# Patient Record
Sex: Male | Born: 1975
Health system: Southern US, Community
[De-identification: ages and names within clinical notes are randomized; demographics above are authoritative.]

## PROBLEM LIST (undated history)

## (undated) DIAGNOSIS — T8859XA Other complications of anesthesia, initial encounter: Secondary | ICD-10-CM

## (undated) DIAGNOSIS — G459 Transient cerebral ischemic attack, unspecified: Secondary | ICD-10-CM

## (undated) DIAGNOSIS — I10 Essential (primary) hypertension: Secondary | ICD-10-CM

## (undated) DIAGNOSIS — T4145XA Adverse effect of unspecified anesthetic, initial encounter: Secondary | ICD-10-CM

## (undated) DIAGNOSIS — T884XXA Failed or difficult intubation, initial encounter: Secondary | ICD-10-CM

## (undated) DIAGNOSIS — T50901A Poisoning by unspecified drugs, medicaments and biological substances, accidental (unintentional), initial encounter: Secondary | ICD-10-CM

## (undated) DIAGNOSIS — F319 Bipolar disorder, unspecified: Secondary | ICD-10-CM

## (undated) DIAGNOSIS — E119 Type 2 diabetes mellitus without complications: Secondary | ICD-10-CM

## (undated) DIAGNOSIS — F419 Anxiety disorder, unspecified: Secondary | ICD-10-CM

## (undated) DIAGNOSIS — G4733 Obstructive sleep apnea (adult) (pediatric): Secondary | ICD-10-CM

## (undated) DIAGNOSIS — T782XXA Anaphylactic shock, unspecified, initial encounter: Secondary | ICD-10-CM

## (undated) DIAGNOSIS — E785 Hyperlipidemia, unspecified: Secondary | ICD-10-CM

## (undated) DIAGNOSIS — I38 Endocarditis, valve unspecified: Secondary | ICD-10-CM

## (undated) DIAGNOSIS — F909 Attention-deficit hyperactivity disorder, unspecified type: Secondary | ICD-10-CM

## (undated) DIAGNOSIS — F1910 Other psychoactive substance abuse, uncomplicated: Secondary | ICD-10-CM

## (undated) HISTORY — DX: Transient cerebral ischemic attack, unspecified: G45.9

## (undated) HISTORY — PX: TOOTH EXTRACTION: SUR596

## (undated) HISTORY — DX: Obstructive sleep apnea (adult) (pediatric): G47.33

## (undated) HISTORY — PX: CARPAL TUNNEL RELEASE: SHX101

## (undated) HISTORY — PX: WISDOM TOOTH EXTRACTION: SHX21

## (undated) HISTORY — DX: Anxiety disorder, unspecified: F41.9

## (undated) HISTORY — PX: NOSE SURGERY: SHX723

---

## 2003-05-06 ENCOUNTER — Emergency Department (HOSPITAL_COMMUNITY): Admission: AD | Admit: 2003-05-06 | Discharge: 2003-05-06 | Payer: Self-pay | Admitting: Emergency Medicine

## 2003-09-23 ENCOUNTER — Emergency Department (HOSPITAL_COMMUNITY): Admission: EM | Admit: 2003-09-23 | Discharge: 2003-09-23 | Payer: Self-pay

## 2003-10-04 ENCOUNTER — Emergency Department (HOSPITAL_COMMUNITY): Admission: EM | Admit: 2003-10-04 | Discharge: 2003-10-04 | Payer: Self-pay | Admitting: Pediatrics

## 2003-11-28 ENCOUNTER — Inpatient Hospital Stay: Payer: Self-pay | Admitting: Psychiatry

## 2003-12-13 ENCOUNTER — Inpatient Hospital Stay (HOSPITAL_COMMUNITY): Admission: EM | Admit: 2003-12-13 | Discharge: 2003-12-18 | Payer: Self-pay | Admitting: Psychiatry

## 2003-12-13 ENCOUNTER — Ambulatory Visit: Payer: Self-pay | Admitting: Psychiatry

## 2003-12-15 ENCOUNTER — Encounter: Payer: Self-pay | Admitting: Emergency Medicine

## 2004-10-23 ENCOUNTER — Emergency Department (HOSPITAL_COMMUNITY): Admission: EM | Admit: 2004-10-23 | Discharge: 2004-10-24 | Payer: Self-pay | Admitting: Emergency Medicine

## 2004-11-15 ENCOUNTER — Emergency Department (HOSPITAL_COMMUNITY): Admission: EM | Admit: 2004-11-15 | Discharge: 2004-11-15 | Payer: Self-pay | Admitting: Family Medicine

## 2004-11-18 ENCOUNTER — Emergency Department (HOSPITAL_COMMUNITY): Admission: EM | Admit: 2004-11-18 | Discharge: 2004-11-18 | Payer: Self-pay | Admitting: Family Medicine

## 2005-01-19 ENCOUNTER — Ambulatory Visit: Payer: Self-pay | Admitting: Family Medicine

## 2005-06-22 IMAGING — CR DG CHEST 2V
2 series · 2 of 2 positions shown · non-contrast
Comparison: none

CLINICAL DATA: Chest pain and shortness of breath.
 CHEST - 2 VIEWS   
 Two views of the chest show no pneumonia.  Mild peribronchial thickening is seen.  The heart is within normal limits in size.
 IMPRESSION
 No active lung disease.   There is some peribronchial thickening present which may indicate bronchitis.

[view not recorded (1 of 2)]
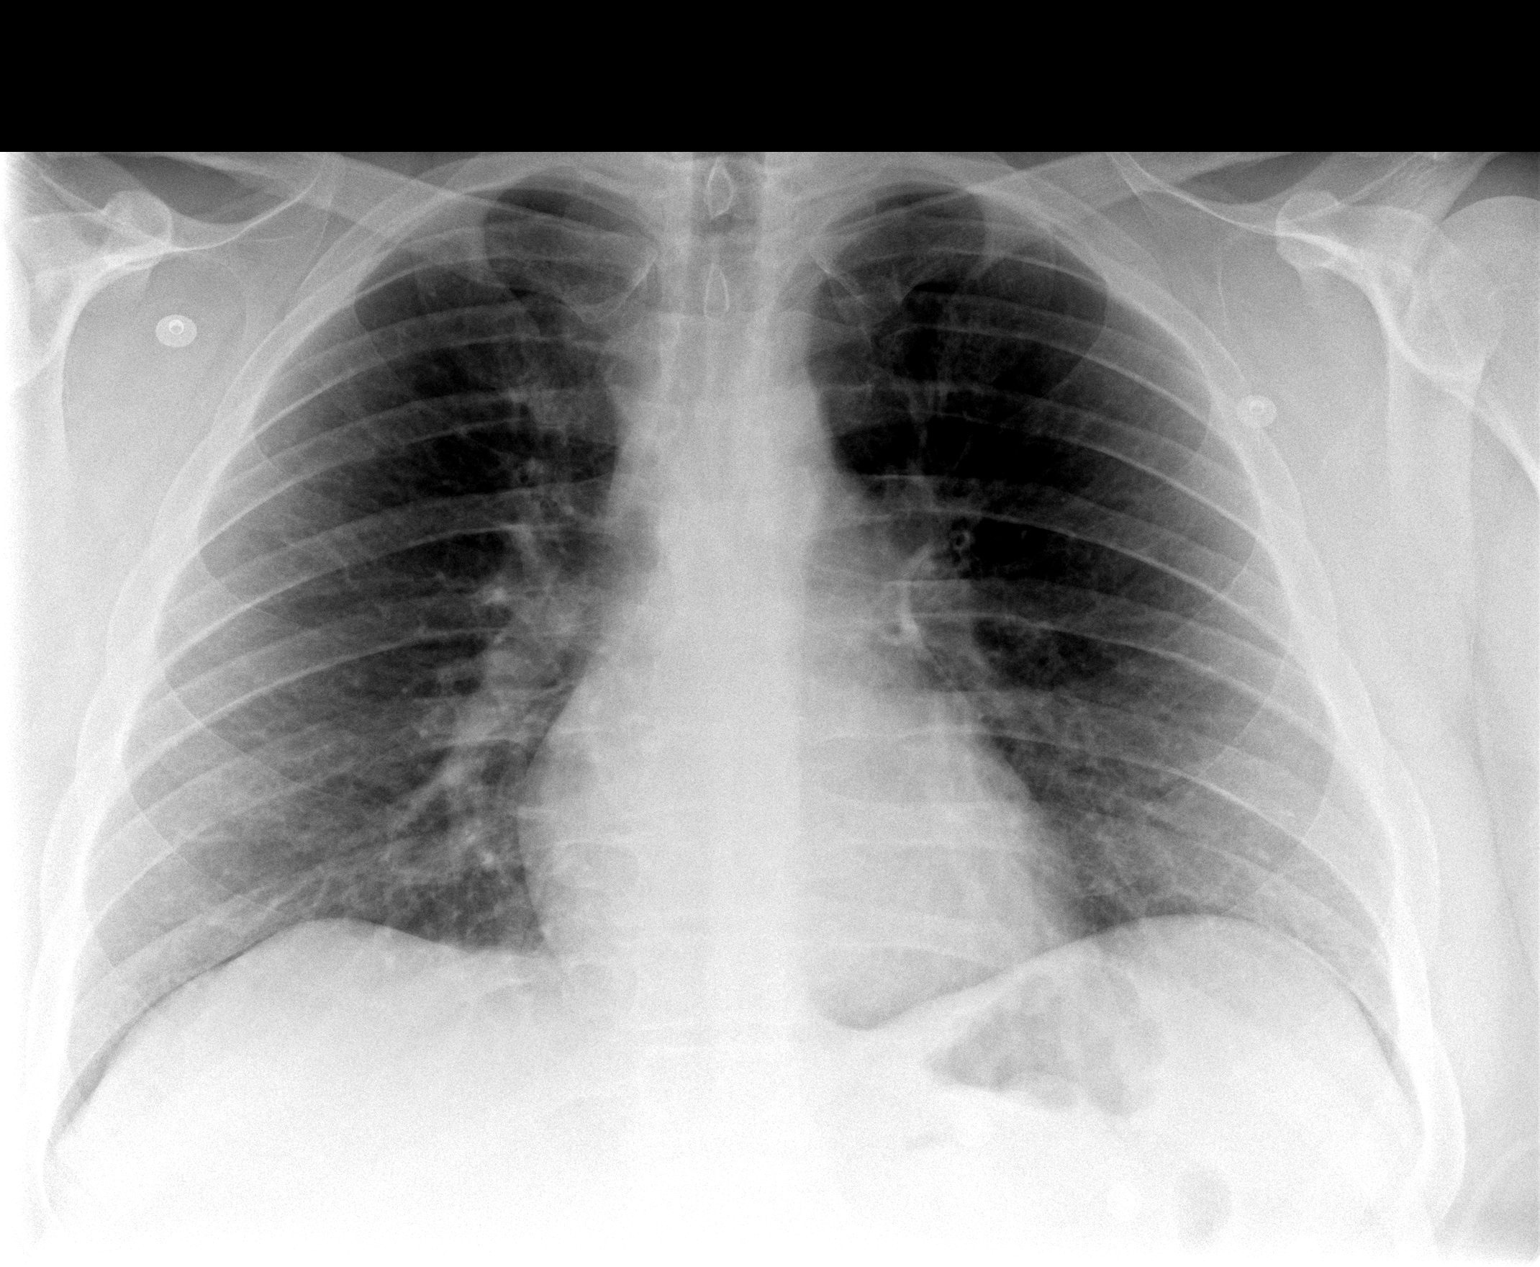

[view not recorded (2 of 2)]
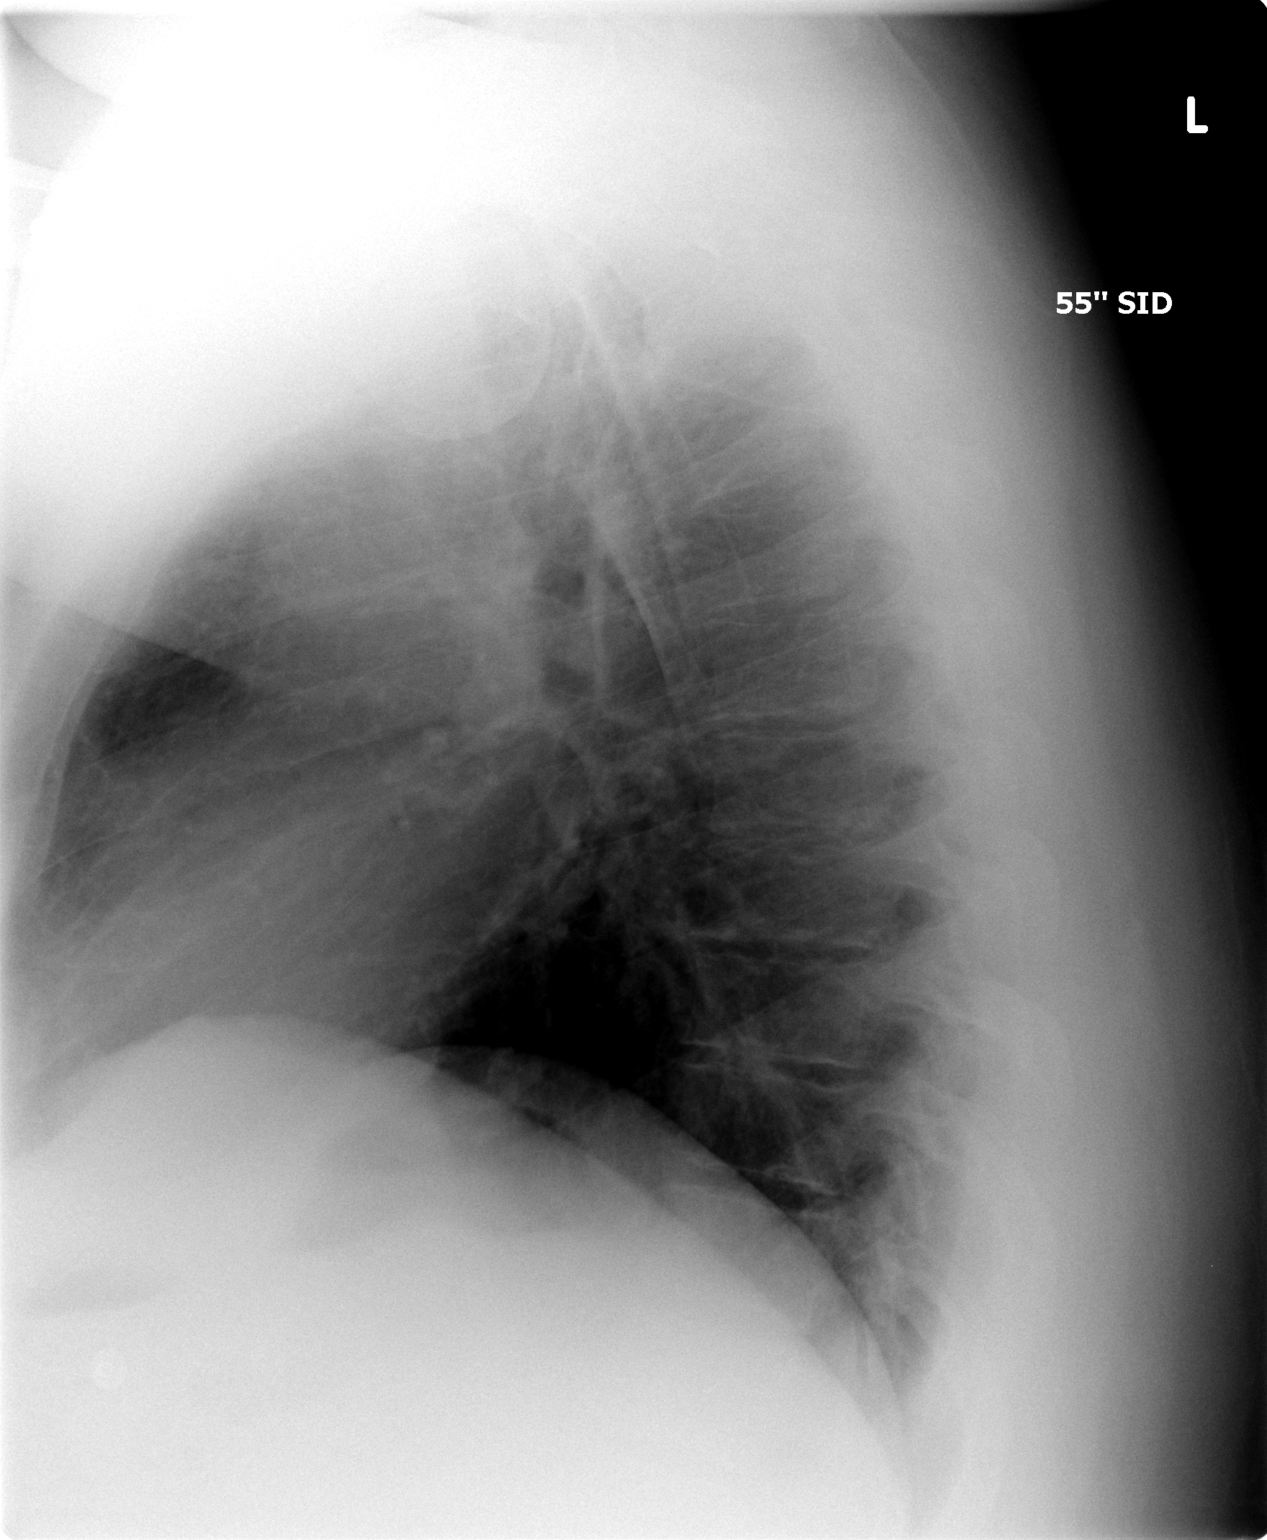

[2 of 2 positions shown; findings below may reference images not displayed]

## 2005-07-02 ENCOUNTER — Emergency Department (HOSPITAL_COMMUNITY): Admission: EM | Admit: 2005-07-02 | Discharge: 2005-07-02 | Payer: Self-pay | Admitting: Emergency Medicine

## 2005-07-03 IMAGING — CR DG CHEST 1V PORT
1 series · 1 of 1 positions shown · non-contrast
Comparison: none

CLINICAL DATA: Chest pain/shortness of breath. 
 PORTABLE CHEST 10/04/03 
 AP upright portable compared to 09/23/03.  
 Heart size within normal limits.  Suboptimal level of inspiration.  Cannot rule out early congestive heart failure although I suspect this is an artifact of under inspiration.  Recommend PA and lateral chest when possible. 
 IMPRESSION
 Suboptimal inspiration ? cannot rule out early congestive heart failure (suspect artifact of under inspiration).

[view not recorded]
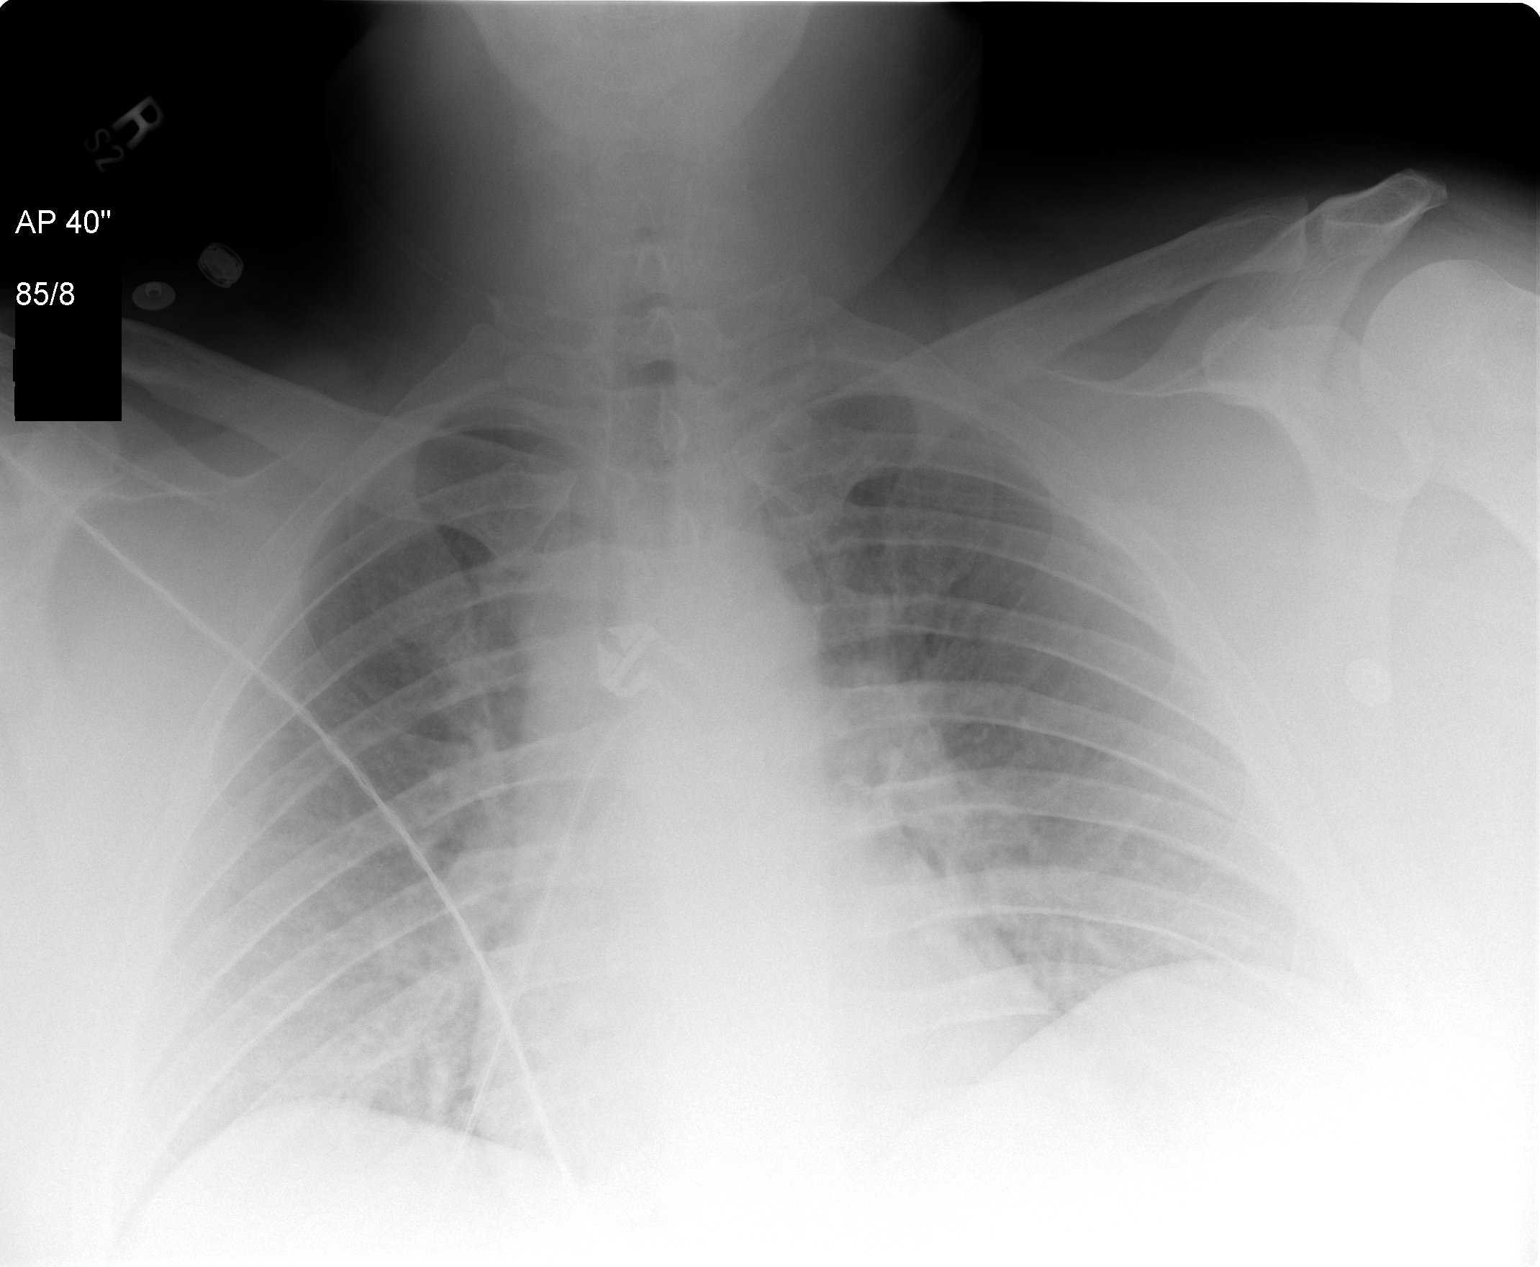

[1 of 1 positions shown; findings below may reference images not displayed]

## 2005-09-13 IMAGING — CR DG CHEST 1V PORT
1 series · 1 of 1 positions shown · non-contrast
Comparison: 10/04/03.

CLINICAL DATA: Chest pain. 
 PORTABLE CHEST

[view not recorded]
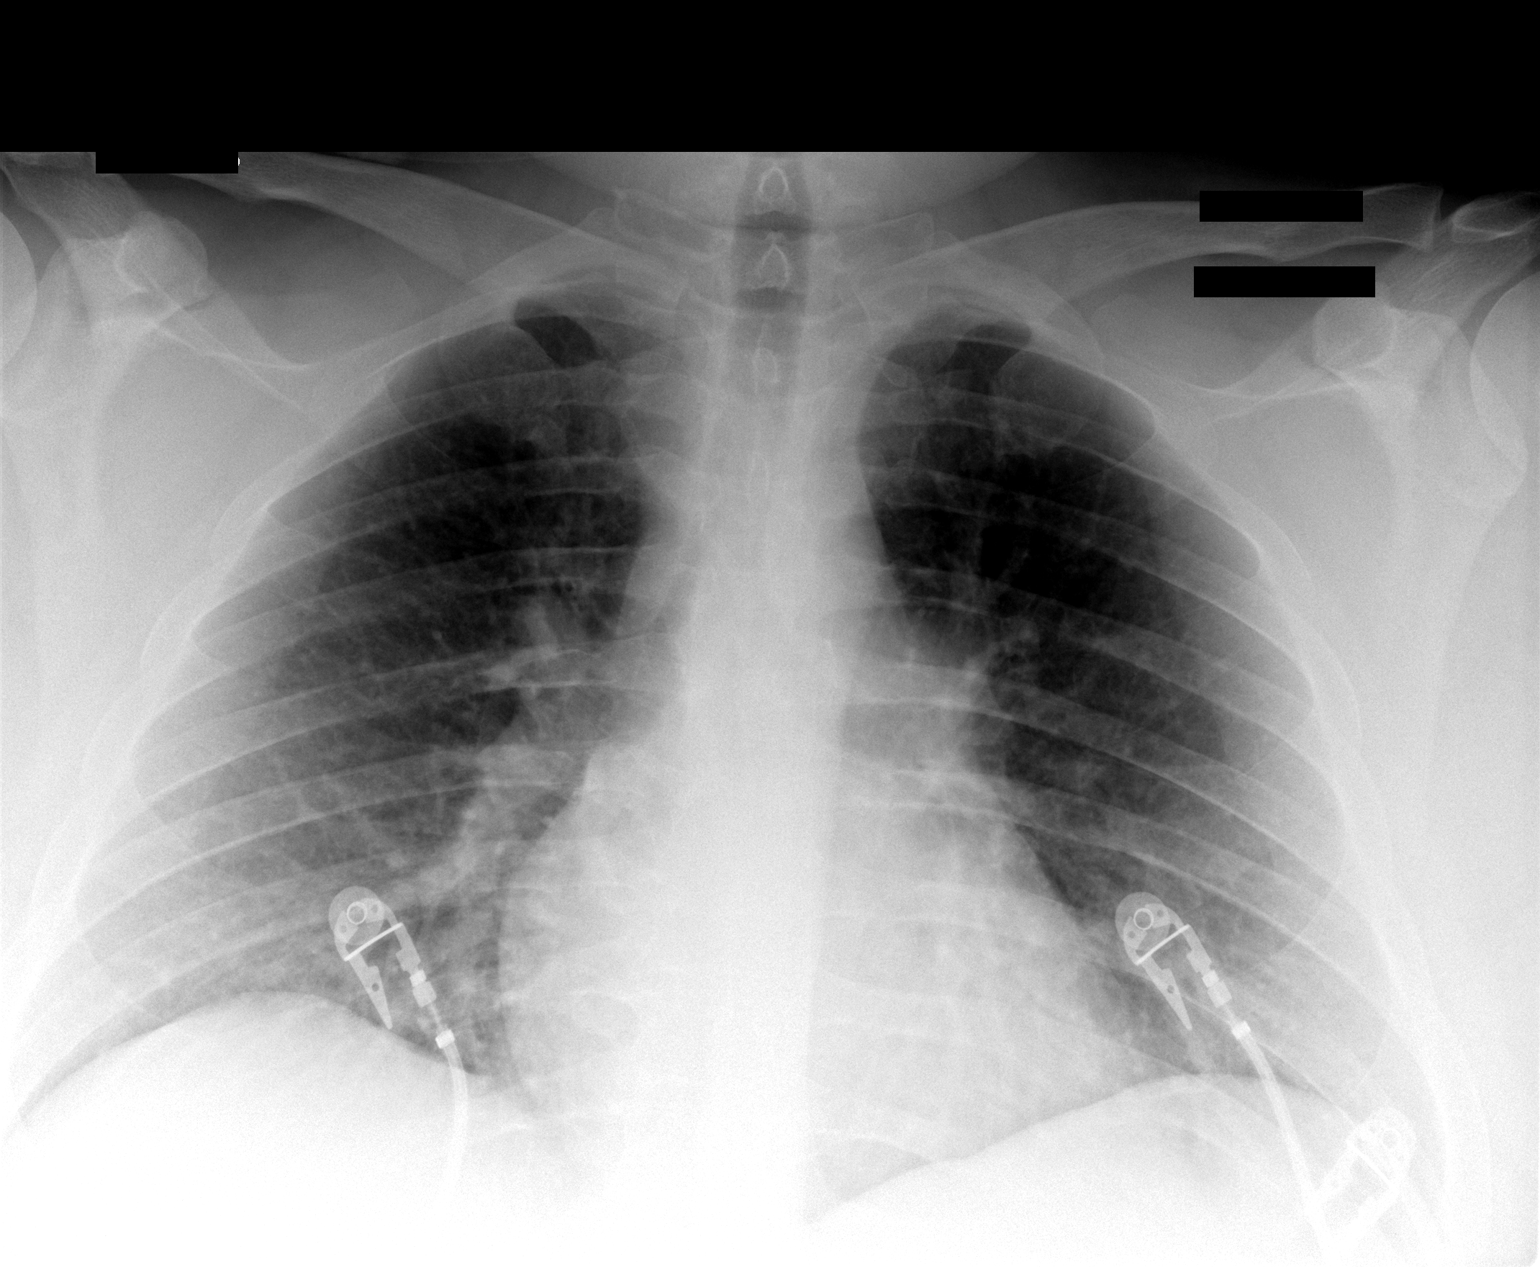

[1 of 1 positions shown; findings below may reference images not displayed]

Heart size is within normal limits for portable technique.  No congestive heart failure or active disease.
IMPRESSION: Within normal limits for portable chest.
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
## 2007-03-02 ENCOUNTER — Encounter: Payer: Self-pay | Admitting: Family Medicine
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
## 2008-01-17 ENCOUNTER — Emergency Department (HOSPITAL_COMMUNITY): Admission: EM | Admit: 2008-01-17 | Discharge: 2008-01-17 | Payer: Self-pay | Admitting: Emergency Medicine

## 2008-07-15 ENCOUNTER — Emergency Department (HOSPITAL_BASED_OUTPATIENT_CLINIC_OR_DEPARTMENT_OTHER): Admission: EM | Admit: 2008-07-15 | Discharge: 2008-07-15 | Payer: Self-pay | Admitting: Emergency Medicine

## 2008-07-15 ENCOUNTER — Ambulatory Visit: Payer: Self-pay | Admitting: Radiology

## 2008-07-26 ENCOUNTER — Emergency Department (HOSPITAL_COMMUNITY): Admission: EM | Admit: 2008-07-26 | Discharge: 2008-07-26 | Payer: Self-pay | Admitting: Emergency Medicine

## 2008-07-27 ENCOUNTER — Encounter: Admission: RE | Admit: 2008-07-27 | Discharge: 2008-07-27 | Payer: Self-pay | Admitting: Emergency Medicine

## 2008-09-18 ENCOUNTER — Observation Stay (HOSPITAL_COMMUNITY): Admission: EM | Admit: 2008-09-18 | Discharge: 2008-09-18 | Payer: Self-pay | Admitting: Internal Medicine

## 2008-09-18 ENCOUNTER — Ambulatory Visit: Payer: Self-pay | Admitting: Diagnostic Radiology

## 2008-09-18 ENCOUNTER — Encounter: Payer: Self-pay | Admitting: Emergency Medicine

## 2008-09-18 ENCOUNTER — Emergency Department (HOSPITAL_COMMUNITY): Admission: EM | Admit: 2008-09-18 | Discharge: 2008-09-19 | Payer: Self-pay | Admitting: Emergency Medicine

## 2008-11-20 ENCOUNTER — Encounter
Admission: RE | Admit: 2008-11-20 | Discharge: 2008-11-20 | Payer: Self-pay | Admitting: Physical Medicine & Rehabilitation

## 2008-12-18 ENCOUNTER — Encounter: Admission: RE | Admit: 2008-12-18 | Discharge: 2008-12-18 | Payer: Self-pay | Admitting: Sports Medicine

## 2009-01-15 ENCOUNTER — Encounter: Payer: Self-pay | Admitting: Specialist

## 2009-01-17 ENCOUNTER — Ambulatory Visit (HOSPITAL_COMMUNITY): Admission: RE | Admit: 2009-01-17 | Discharge: 2009-01-17 | Payer: Self-pay | Admitting: Specialist

## 2009-01-28 ENCOUNTER — Ambulatory Visit (HOSPITAL_COMMUNITY): Admission: RE | Admit: 2009-01-28 | Discharge: 2009-01-28 | Payer: Self-pay | Admitting: Specialist

## 2009-03-05 ENCOUNTER — Encounter: Admission: RE | Admit: 2009-03-05 | Discharge: 2009-03-05 | Payer: Self-pay | Admitting: *Deleted

## 2009-03-05 ENCOUNTER — Encounter: Admission: RE | Admit: 2009-03-05 | Discharge: 2009-03-05 | Payer: Self-pay | Admitting: Orthopedic Surgery

## 2009-07-21 ENCOUNTER — Ambulatory Visit: Payer: Self-pay | Admitting: Radiology

## 2009-07-21 ENCOUNTER — Emergency Department (HOSPITAL_BASED_OUTPATIENT_CLINIC_OR_DEPARTMENT_OTHER): Admission: EM | Admit: 2009-07-21 | Discharge: 2009-07-21 | Payer: Self-pay | Admitting: Emergency Medicine

## 2009-08-14 ENCOUNTER — Inpatient Hospital Stay (HOSPITAL_COMMUNITY): Admission: RE | Admit: 2009-08-14 | Discharge: 2009-08-15 | Payer: Self-pay | Admitting: Otolaryngology

## 2009-08-19 ENCOUNTER — Ambulatory Visit: Payer: Self-pay | Admitting: Diagnostic Radiology

## 2009-08-19 ENCOUNTER — Observation Stay (HOSPITAL_COMMUNITY): Admission: EM | Admit: 2009-08-19 | Discharge: 2009-08-21 | Payer: Self-pay

## 2009-08-19 ENCOUNTER — Other Ambulatory Visit: Payer: Self-pay | Admitting: Emergency Medicine

## 2009-08-19 ENCOUNTER — Encounter: Payer: Self-pay | Admitting: Emergency Medicine

## 2009-08-21 ENCOUNTER — Encounter: Payer: Self-pay | Admitting: Cardiology

## 2009-09-10 ENCOUNTER — Encounter: Admission: RE | Admit: 2009-09-10 | Discharge: 2009-09-10 | Payer: Self-pay | Admitting: Internal Medicine

## 2009-10-11 ENCOUNTER — Ambulatory Visit: Payer: Self-pay | Admitting: Diagnostic Radiology

## 2009-10-11 ENCOUNTER — Emergency Department (HOSPITAL_BASED_OUTPATIENT_CLINIC_OR_DEPARTMENT_OTHER): Admission: EM | Admit: 2009-10-11 | Discharge: 2009-10-12 | Payer: Self-pay | Admitting: Emergency Medicine

## 2009-10-13 ENCOUNTER — Ambulatory Visit: Payer: Self-pay | Admitting: Diagnostic Radiology

## 2009-10-13 ENCOUNTER — Ambulatory Visit (HOSPITAL_BASED_OUTPATIENT_CLINIC_OR_DEPARTMENT_OTHER): Admission: RE | Admit: 2009-10-13 | Discharge: 2009-10-13 | Payer: Self-pay | Admitting: Emergency Medicine

## 2010-02-26 ENCOUNTER — Emergency Department (HOSPITAL_BASED_OUTPATIENT_CLINIC_OR_DEPARTMENT_OTHER)
Admission: EM | Admit: 2010-02-26 | Discharge: 2010-02-26 | Payer: Self-pay | Source: Home / Self Care | Admitting: Emergency Medicine

## 2010-03-31 NOTE — Letter (Signed)
Summary: rpc chart  rpc chart   Imported By: Curtis Sites 12/11/2009 15:54:06  _____________________________________________________________________  External Attachment:    Type:   Image     Comment:   External Document

## 2010-04-14 IMAGING — CR DG LUMBAR SPINE COMPLETE 4+V
5 series · 5 of 5 positions shown · non-contrast
Comparison: None

CLINICAL DATA: Injury.  Low back pain.

LUMBAR SPINE - COMPLETE 4+ VIEW

[t l-spine a.p. *]
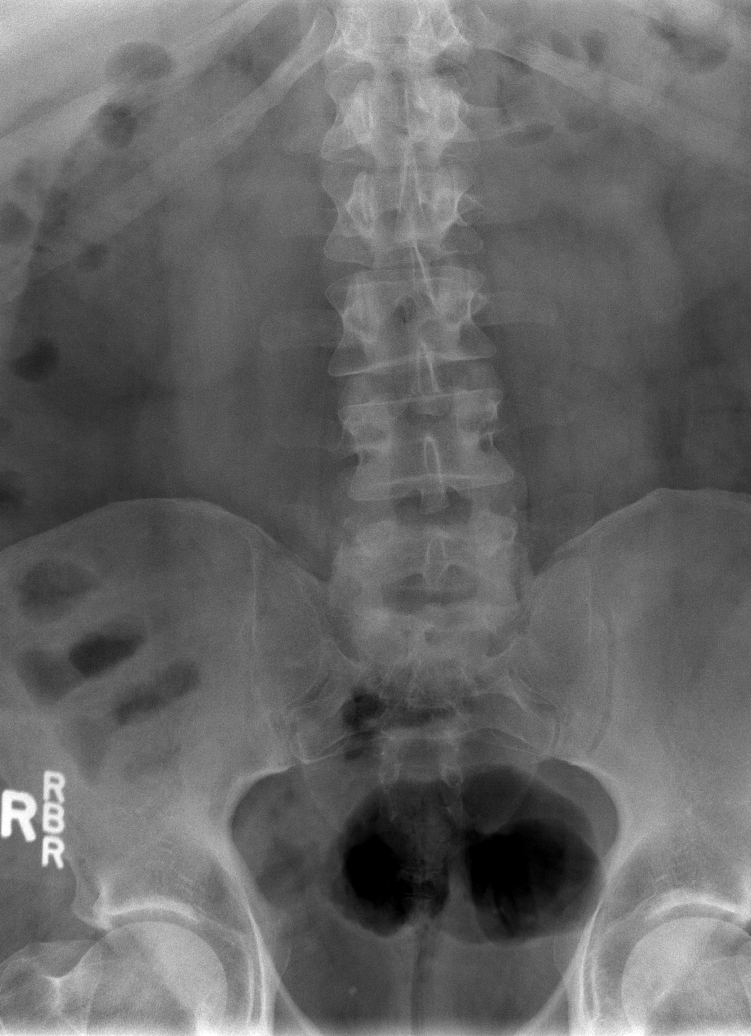

[t l-spine oblique exposure * (1 of 2)]
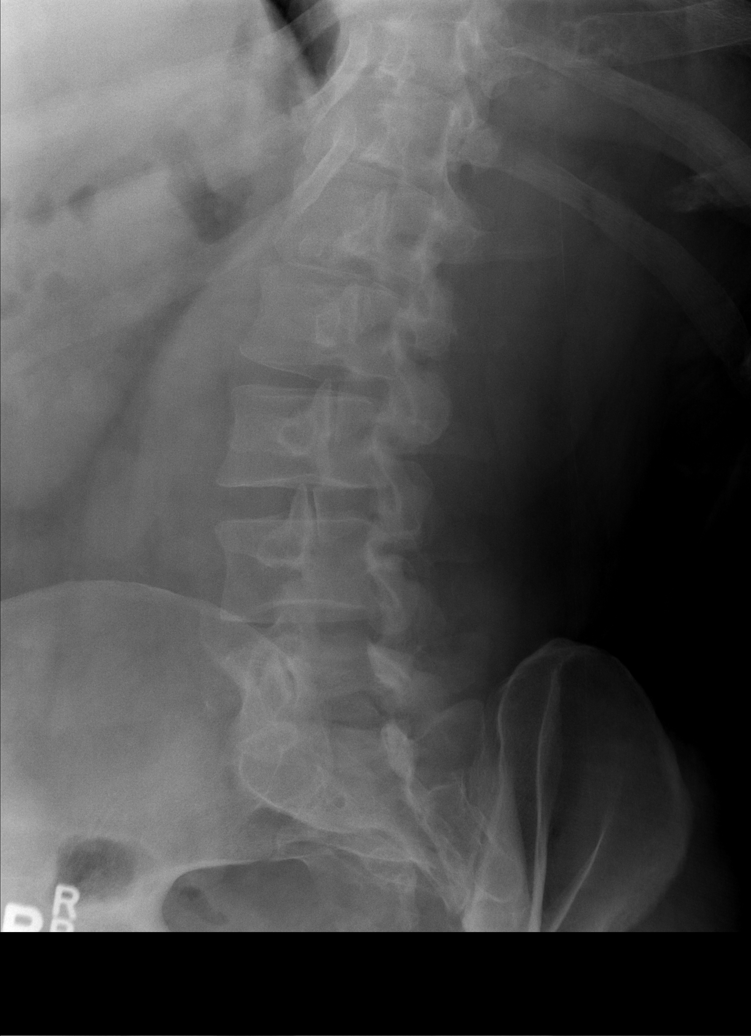

[t l-spine oblique exposure * (2 of 2)]
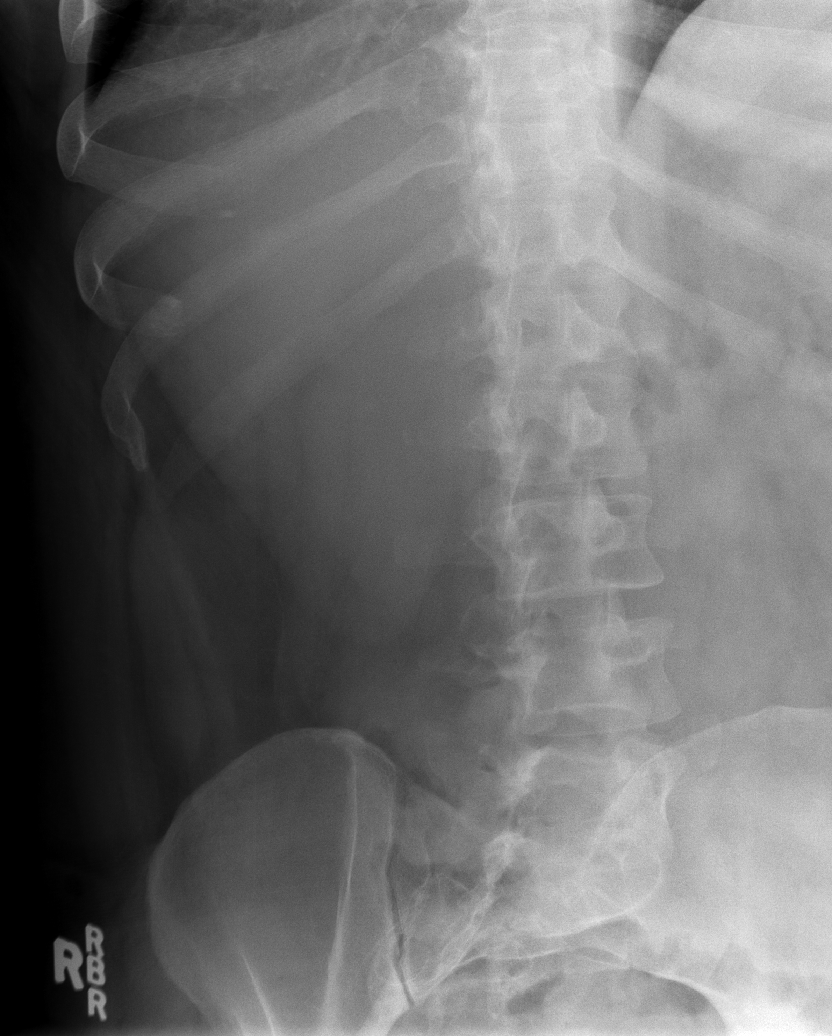

[t l-spine lat (1 of 2)]
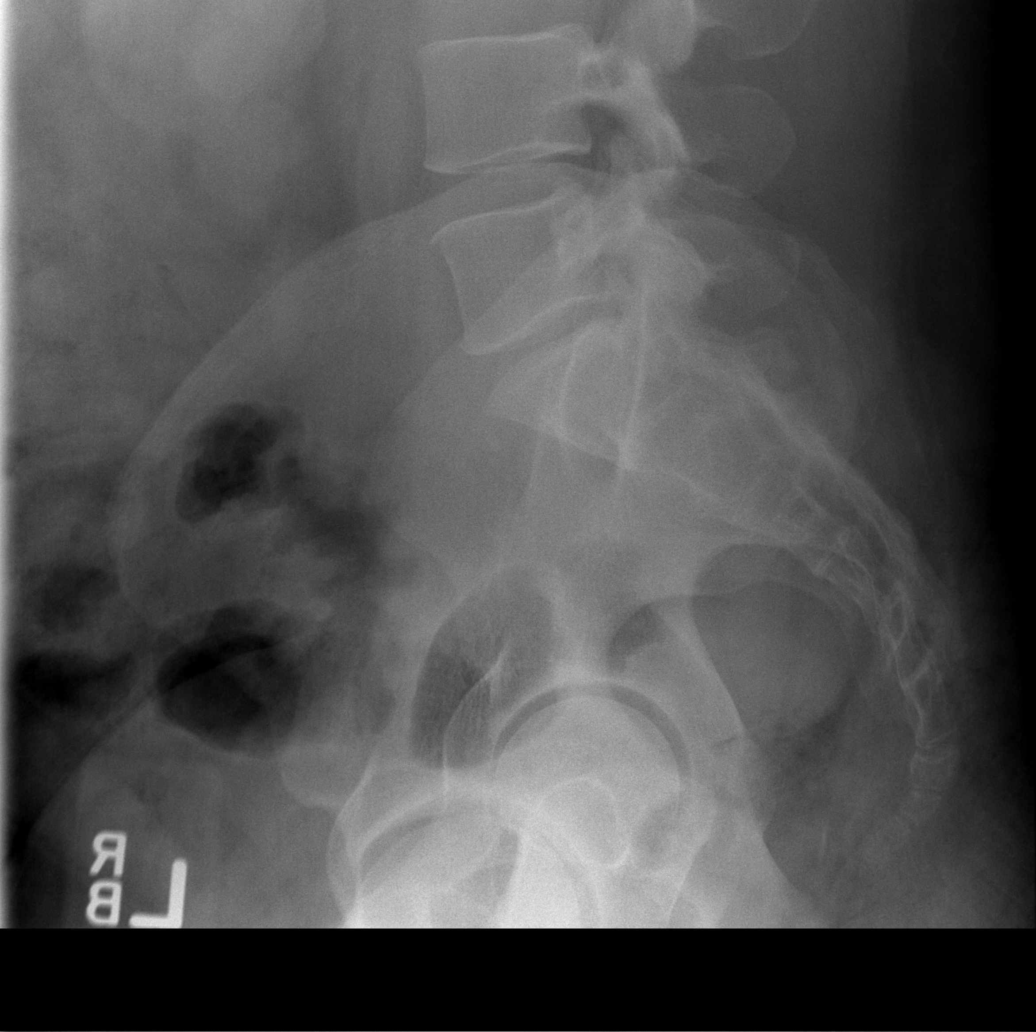

[t l-spine lat (2 of 2)]
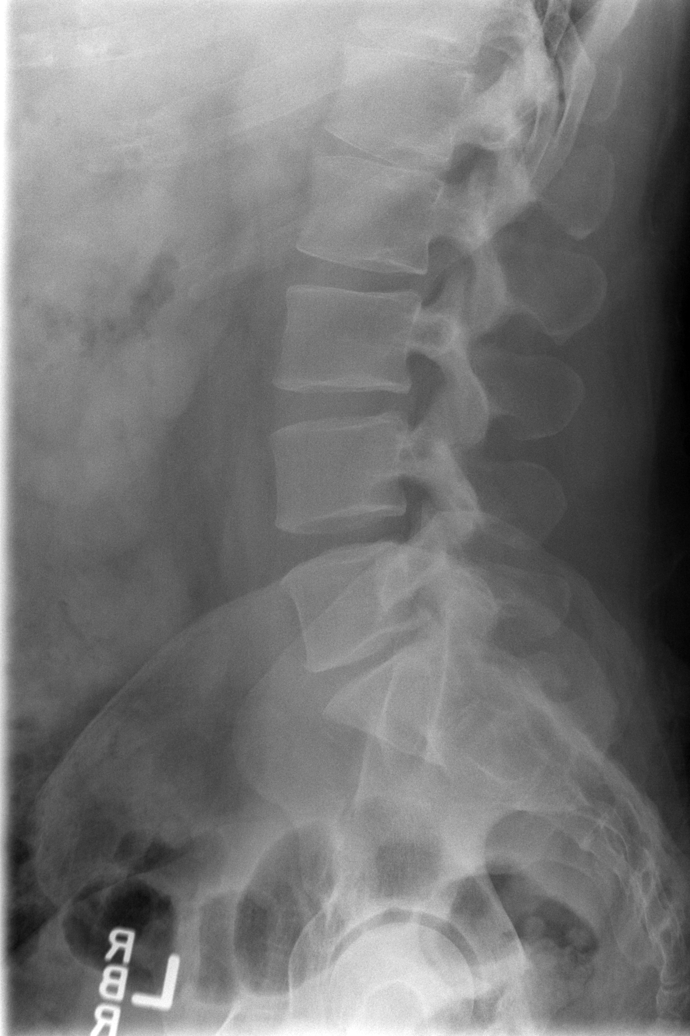

[5 of 5 positions shown; findings below may reference images not displayed]

FINDINGS: There is no evidence of lumbar spine fracture.  Alignment
is normal.  Intervertebral disc spaces are maintained.
IMPRESSION: Negative.

## 2010-04-26 IMAGING — MR MR LUMBAR SPINE W/O CM
4 of 6 series · 16 of 48 positions shown · non-contrast
Comparison: Lumbar spine radiographs 07/15/2008.

CLINICAL DATA: Severe low back and left leg pain with left leg
numbness since falling 1 week ago.

MRI LUMBAR SPINE WITHOUT CONTRAST
TECHNIQUE: Multiplanar and multiecho pulse sequences of the lumbar
spine were obtained without intravenous contrast.

[Series 3: T2 · sagittal · 4.0mm · 0.44mm/px · 4 of 11 slices shown (1 of 2)]
[im 1/11]
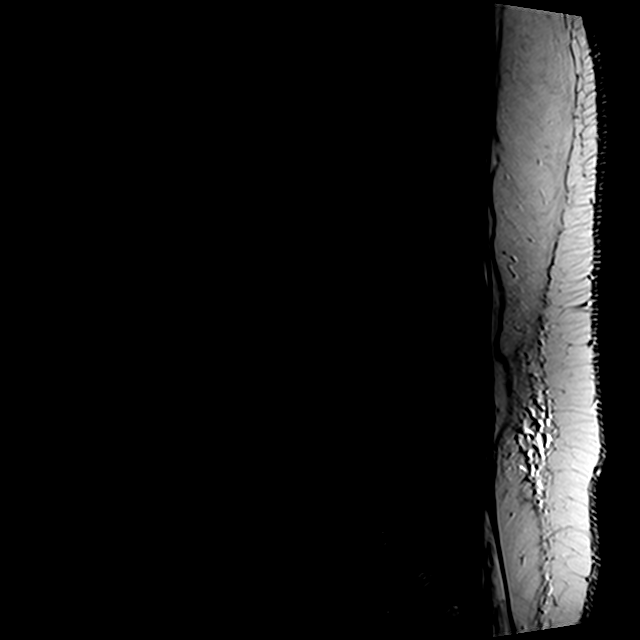
[im 4/11]
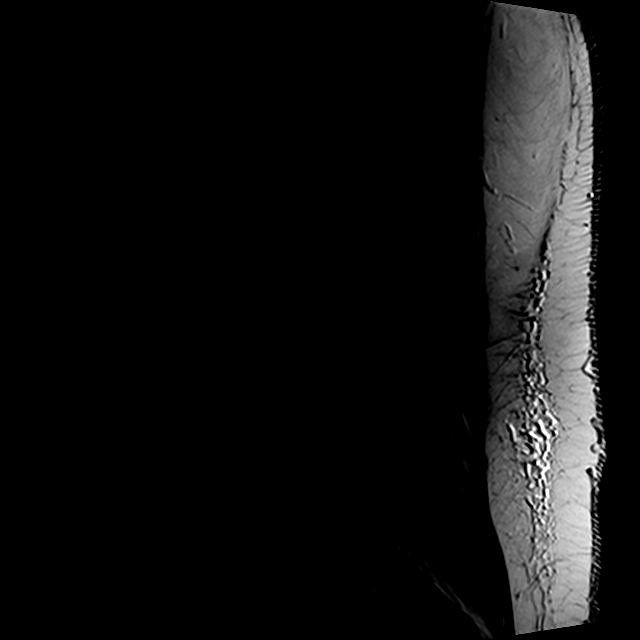
[im 7/11]
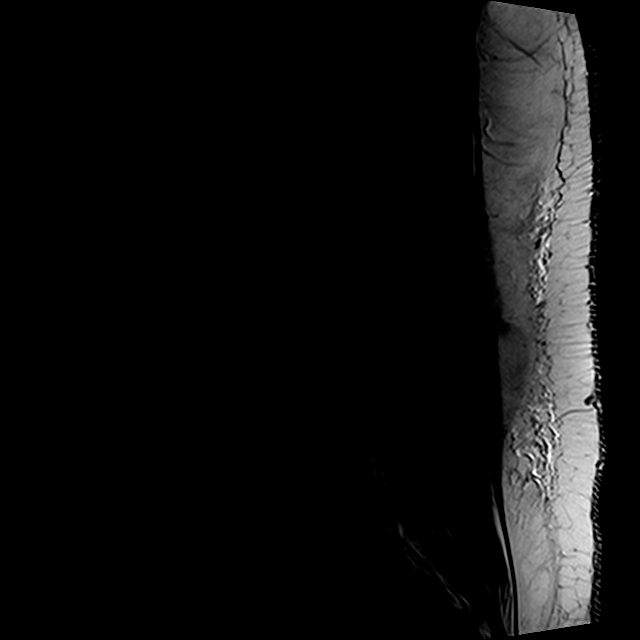
[im 11/11]
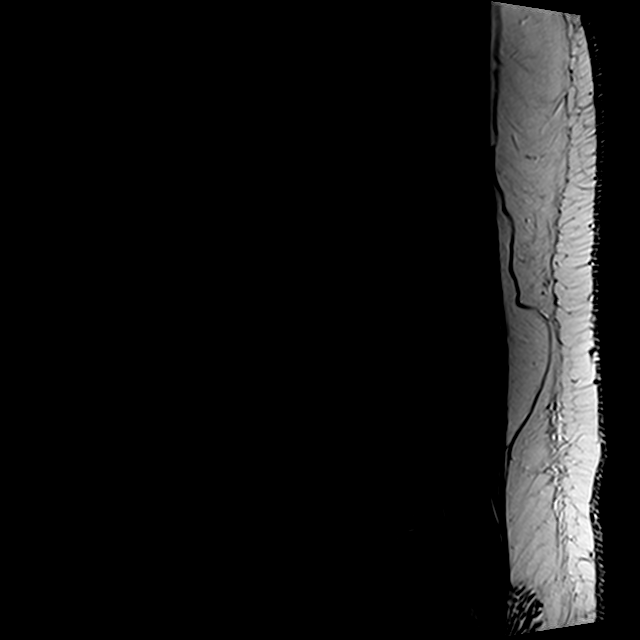

[Series 4: T1 · sagittal · 4.0mm · 0.44mm/px · 3 of 11 slices shown (1 of 2)]
[im 1/11]
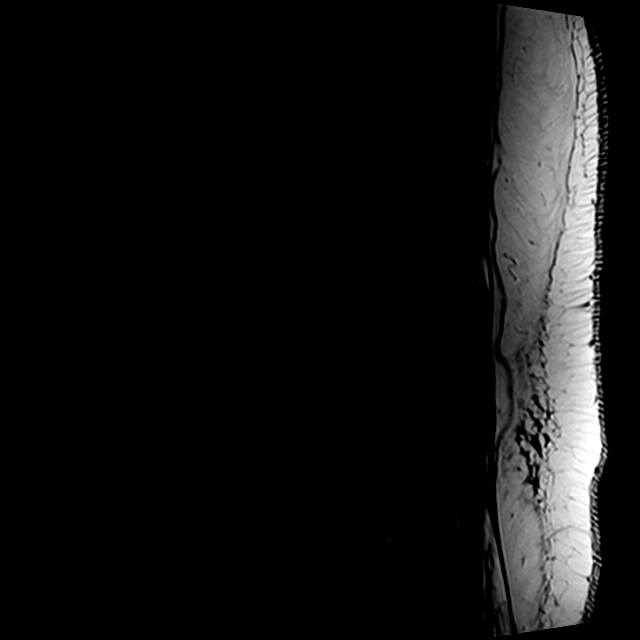
[im 7/11]
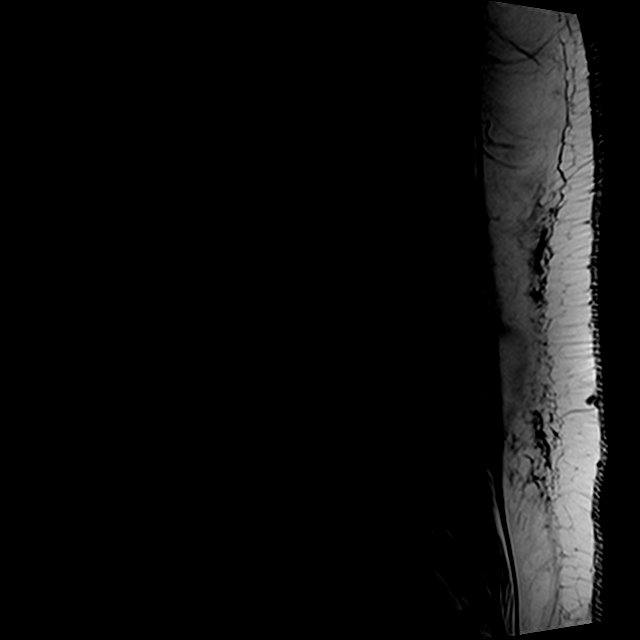
[im 11/11]
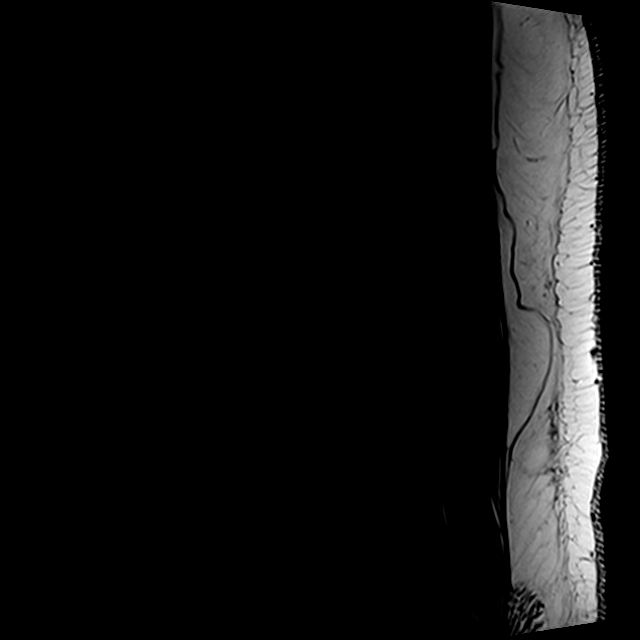

[Series 5: T2 · axial · 4.0mm · 0.47mm/px · z∈[-40,+130]mm · 6 of 35 slices shown (2 of 2)]
[im 1/35]
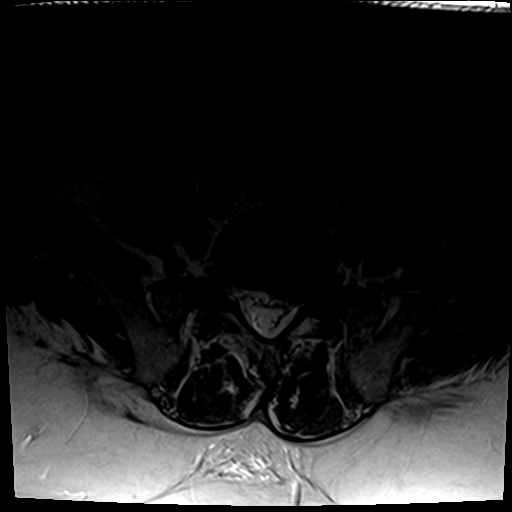
[im 5/35]
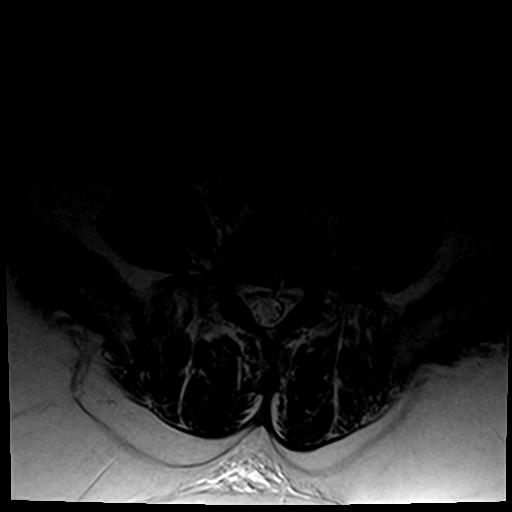
[im 10/35]
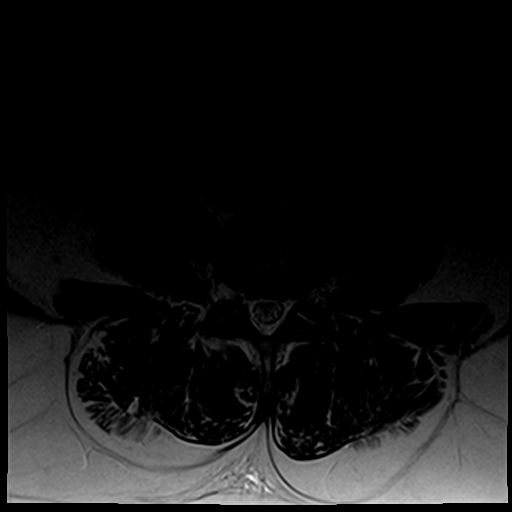
[im 15/35]
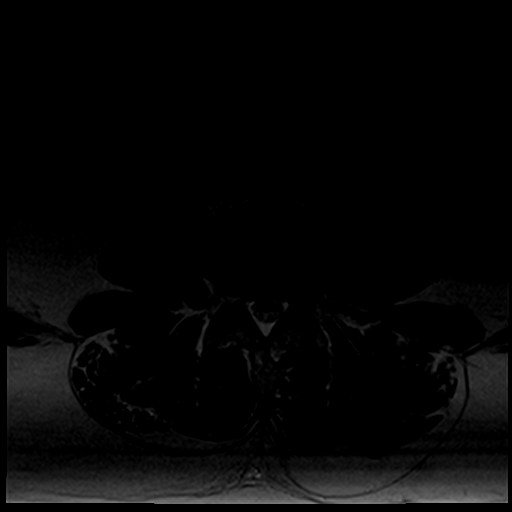
[im 18/35]
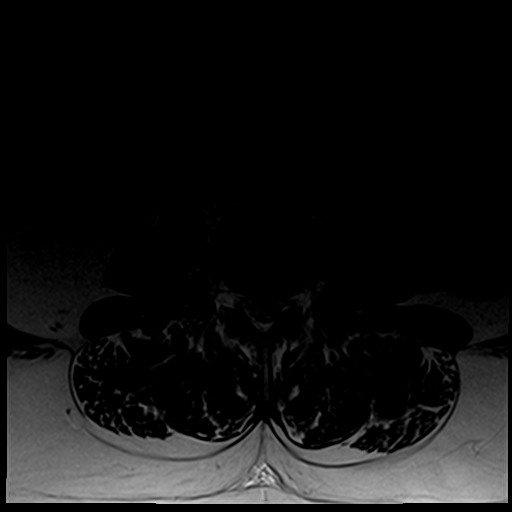
[im 30/35]
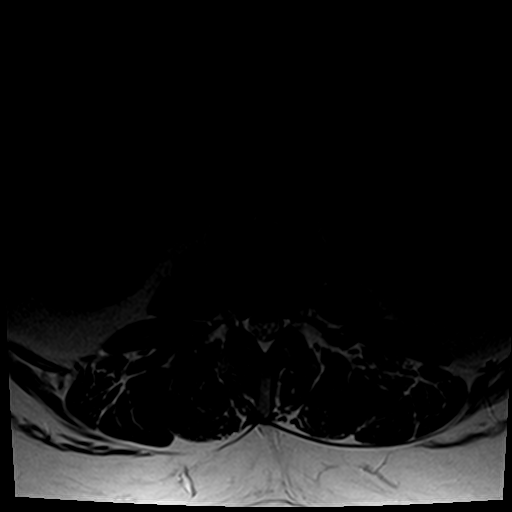

[Series 7: T1 · axial · 4.0mm · 0.47mm/px · z∈[-20,+130]mm · 3 of 35 slices shown (2 of 2)]
[im 5/35]
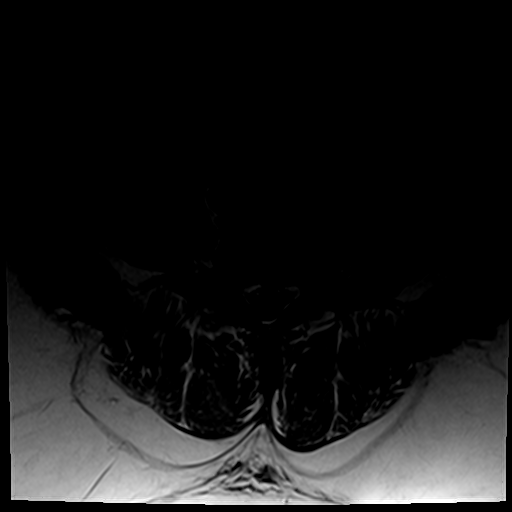
[im 18/35]
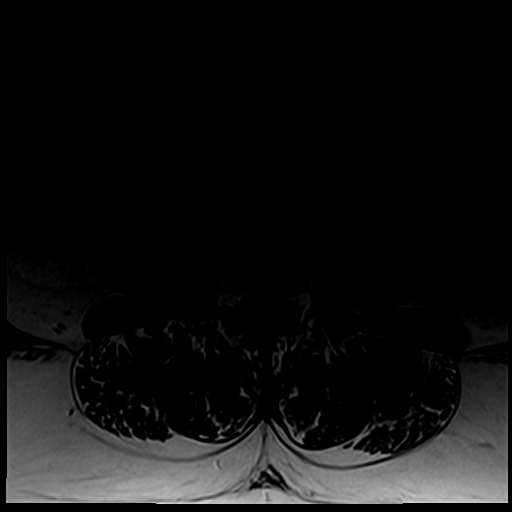
[im 30/35]
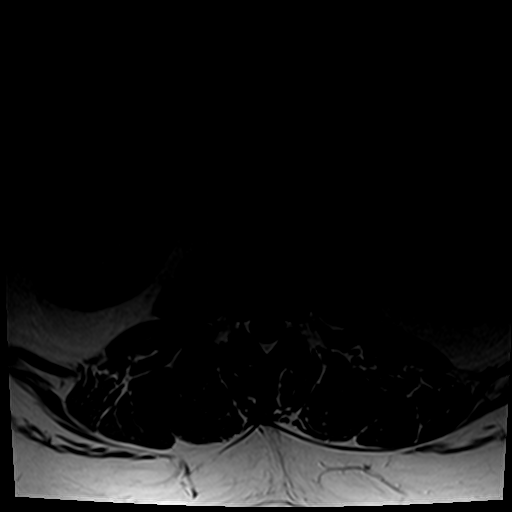

[16 of 48 positions shown; findings below may reference images not displayed]

FINDINGS: The alignment is normal.  There is no evidence of
fracture or pars defect.  Small hemangiomas are present in the L1
and L3 vertebral bodies.  There are scattered Schmorl's nodes.

The conus medullaris extends to the upper L2 level and appears
normal.  There are no paraspinal abnormalities.  The lumbar
pedicles are short on a congenital basis.

From T12-L1 through L4-L5, the discs are well hydrated.  There is
no disc herniation, spinal stenosis or nerve root encroachment.
The facet joints are mildly hypertrophied bilaterally at L4-L5.

L5-S1:  There is a posterolateral and medial foraminal disc
protrusion on the left with possible resulting left L5 nerve root
encroachment medially in the foramen.  No definite S1 nerve root
encroachment is seen.  The facet joints are mildly hypertrophied
bilaterally.
IMPRESSION: 1.  Posterolateral and medial foraminal disc protrusion on the left
at L5-S[DATE] result in left L5 nerve root encroachment.
2.  Mild facet degenerative changes bilaterally at L4-L5 and L5-S1.
3.  No other disc space findings are present.
4.  No acute osseous findings.

## 2010-05-11 LAB — COMPREHENSIVE METABOLIC PANEL
ALT: 93 U/L — ABNORMAL HIGH (ref 0–53)
Albumin: 3.8 g/dL (ref 3.5–5.2)
Alkaline Phosphatase: 65 U/L (ref 39–117)
BUN: 12 mg/dL (ref 6–23)
Potassium: 4.3 mEq/L (ref 3.5–5.1)
Sodium: 141 mEq/L (ref 135–145)
Total Protein: 7 g/dL (ref 6.0–8.3)

## 2010-05-11 LAB — DIFFERENTIAL
Basophils Relative: 0 % (ref 0–1)
Eosinophils Absolute: 0.4 10*3/uL (ref 0.0–0.7)
Eosinophils Relative: 4 % (ref 0–5)
Monocytes Absolute: 1 10*3/uL (ref 0.1–1.0)
Monocytes Relative: 9 % (ref 3–12)
Neutrophils Relative %: 60 % (ref 43–77)

## 2010-05-11 LAB — CBC
Platelets: 249 10*3/uL (ref 150–400)
RDW: 12.3 % (ref 11.5–15.5)
WBC: 11.3 10*3/uL — ABNORMAL HIGH (ref 4.0–10.5)

## 2010-05-11 LAB — URINALYSIS, ROUTINE W REFLEX MICROSCOPIC
Bilirubin Urine: NEGATIVE
Glucose, UA: NEGATIVE mg/dL
Ketones, ur: NEGATIVE mg/dL
Nitrite: NEGATIVE
Protein, ur: NEGATIVE mg/dL

## 2010-05-11 LAB — POCT CARDIAC MARKERS
CKMB, poc: 1.2 ng/mL (ref 1.0–8.0)
Troponin i, poc: <0.05 ng/mL (ref 0.00–0.09)

## 2010-05-11 LAB — LIPASE, BLOOD: Lipase: 45 U/L (ref 23–300)

## 2010-05-14 ENCOUNTER — Emergency Department: Payer: Self-pay

## 2010-05-15 LAB — POCT CARDIAC MARKERS
CKMB, poc: 4.6 ng/mL (ref 1.0–8.0)
Myoglobin, poc: 182 ng/mL (ref 12–200)
Troponin i, poc: <0.05 ng/mL (ref 0.00–0.09)

## 2010-05-15 LAB — CBC
HCT: 43.2 % (ref 39.0–52.0)
Hemoglobin: 14.7 g/dL (ref 13.0–17.0)
MCV: 92.5 fL (ref 78.0–100.0)
RDW: 12.4 % (ref 11.5–15.5)
WBC: 7.7 10*3/uL (ref 4.0–10.5)

## 2010-05-15 LAB — BASIC METABOLIC PANEL
BUN: 9 mg/dL (ref 6–23)
Chloride: 100 mEq/L (ref 96–112)
GFR calc non Af Amer: >60 mL/min (ref >60)
Glucose, Bld: 113 mg/dL — ABNORMAL HIGH (ref 70–99)
Potassium: 4.1 mEq/L (ref 3.5–5.1)
Sodium: 142 mEq/L (ref 135–145)

## 2010-05-15 LAB — DIFFERENTIAL
Eosinophils Relative: 4 % (ref 0–5)
Lymphocytes Relative: 28 % (ref 12–46)
Lymphs Abs: 2.1 10*3/uL (ref 0.7–4.0)
Monocytes Absolute: 0.7 10*3/uL (ref 0.1–1.0)
Monocytes Relative: 9 % (ref 3–12)
Neutro Abs: 4.4 10*3/uL (ref 1.7–7.7)

## 2010-05-17 LAB — LIPID PANEL
Cholesterol: 181 mg/dL (ref 0–200)
Cholesterol: 182 mg/dL (ref 0–200)
HDL: 29 mg/dL — ABNORMAL LOW (ref >39)
LDL Cholesterol: 77 mg/dL (ref 0–99)
Total CHOL/HDL Ratio: 6.3 RATIO
Triglycerides: 377 mg/dL — ABNORMAL HIGH (ref <150)
Triglycerides: 380 mg/dL — ABNORMAL HIGH (ref <150)

## 2010-05-17 LAB — URINALYSIS, ROUTINE W REFLEX MICROSCOPIC
Bilirubin Urine: NEGATIVE
Ketones, ur: NEGATIVE mg/dL
Nitrite: NEGATIVE
Specific Gravity, Urine: 1.021 (ref 1.005–1.030)
Urobilinogen, UA: 1 mg/dL (ref 0.0–1.0)

## 2010-05-17 LAB — DIFFERENTIAL
Lymphocytes Relative: 25 % (ref 12–46)
Lymphs Abs: 2.4 10*3/uL (ref 0.7–4.0)
Monocytes Relative: 7 % (ref 3–12)
Neutro Abs: 6.4 10*3/uL (ref 1.7–7.7)
Neutrophils Relative %: 64 % (ref 43–77)

## 2010-05-17 LAB — BASIC METABOLIC PANEL
Calcium: 8.9 mg/dL (ref 8.4–10.5)
Chloride: 102 mEq/L (ref 96–112)
Creatinine, Ser: 0.8 mg/dL (ref 0.4–1.5)
GFR calc Af Amer: >60 mL/min (ref >60)
Sodium: 141 mEq/L (ref 135–145)

## 2010-05-17 LAB — CBC
RBC: 4.86 MIL/uL (ref 4.22–5.81)
WBC: 9.8 10*3/uL (ref 4.0–10.5)

## 2010-05-17 LAB — GLUCOSE, CAPILLARY
Glucose-Capillary: 114 mg/dL — ABNORMAL HIGH (ref 70–99)
Glucose-Capillary: 128 mg/dL — ABNORMAL HIGH (ref 70–99)
Glucose-Capillary: 154 mg/dL — ABNORMAL HIGH (ref 70–99)
Glucose-Capillary: 174 mg/dL — ABNORMAL HIGH (ref 70–99)

## 2010-05-17 LAB — CARDIAC PANEL(CRET KIN+CKTOT+MB+TROPI)
Relative Index: 0.6 (ref 0.0–2.5)
Total CK: 260 U/L — ABNORMAL HIGH (ref 7–232)
Total CK: 292 U/L — ABNORMAL HIGH (ref 7–232)
Troponin I: <0.01 ng/mL (ref 0.00–0.06)

## 2010-05-17 LAB — HEPATIC FUNCTION PANEL
ALT: 78 U/L — ABNORMAL HIGH (ref 0–53)
Indirect Bilirubin: 0.5 mg/dL (ref 0.3–0.9)
Total Protein: 7.9 g/dL (ref 6.0–8.3)

## 2010-05-17 LAB — MRSA PCR SCREENING: MRSA by PCR: NEGATIVE

## 2010-05-17 LAB — POCT CARDIAC MARKERS
CKMB, poc: 1.5 ng/mL (ref 1.0–8.0)
CKMB, poc: 1.9 ng/mL (ref 1.0–8.0)
Myoglobin, poc: 122 ng/mL (ref 12–200)
Myoglobin, poc: 132 ng/mL (ref 12–200)
Troponin i, poc: <0.05 ng/mL (ref 0.00–0.09)

## 2010-05-17 LAB — HEMOGLOBIN A1C
Hgb A1c MFr Bld: 6 % — ABNORMAL HIGH (ref <5.7)
Mean Plasma Glucose: 126 mg/dL — ABNORMAL HIGH (ref <117)

## 2010-05-18 LAB — BASIC METABOLIC PANEL
Calcium: 9.2 mg/dL (ref 8.4–10.5)
GFR calc Af Amer: >60 mL/min (ref >60)
GFR calc non Af Amer: >60 mL/min (ref >60)
Sodium: 139 mEq/L (ref 135–145)

## 2010-05-18 LAB — CBC
Hemoglobin: 15.9 g/dL (ref 13.0–17.0)
RBC: 4.89 MIL/uL (ref 4.22–5.81)

## 2010-06-03 LAB — CBC
HCT: 44.5 % (ref 39.0–52.0)
HCT: 47.5 % (ref 39.0–52.0)
Hemoglobin: 16.5 g/dL (ref 13.0–17.0)
MCHC: 35.6 g/dL (ref 30.0–36.0)
MCV: 91.5 fL (ref 78.0–100.0)
Platelets: 226 10*3/uL (ref 150–400)
RDW: 12.5 % (ref 11.5–15.5)
WBC: 10 10*3/uL (ref 4.0–10.5)

## 2010-06-03 LAB — BASIC METABOLIC PANEL
BUN: 14 mg/dL (ref 6–23)
CO2: 25 mEq/L (ref 19–32)
Chloride: 105 mEq/L (ref 96–112)
GFR calc non Af Amer: >60 mL/min (ref >60)
Glucose, Bld: 105 mg/dL — ABNORMAL HIGH (ref 70–99)
Glucose, Bld: 122 mg/dL — ABNORMAL HIGH (ref 70–99)
Potassium: 4.3 mEq/L (ref 3.5–5.1)
Potassium: 4.4 mEq/L (ref 3.5–5.1)
Sodium: 139 mEq/L (ref 135–145)

## 2010-06-07 LAB — COMPREHENSIVE METABOLIC PANEL
ALT: 346 U/L — ABNORMAL HIGH (ref 0–53)
AST: 110 U/L — ABNORMAL HIGH (ref 0–37)
Albumin: 3.6 g/dL (ref 3.5–5.2)
Alkaline Phosphatase: 68 U/L (ref 39–117)
BUN: 9 mg/dL (ref 6–23)
BUN: 14 mg/dL (ref 6–23)
CO2: 27 mEq/L (ref 19–32)
Calcium: 8.6 mg/dL (ref 8.4–10.5)
Creatinine, Ser: 1 mg/dL (ref 0.4–1.5)
GFR calc Af Amer: >60 mL/min (ref >60)
GFR calc non Af Amer: >60 mL/min (ref >60)
Glucose, Bld: 145 mg/dL — ABNORMAL HIGH (ref 70–99)
Potassium: 4.1 mEq/L (ref 3.5–5.1)
Sodium: 141 mEq/L (ref 135–145)
Total Protein: 6.4 g/dL (ref 6.0–8.3)

## 2010-06-07 LAB — URINALYSIS, ROUTINE W REFLEX MICROSCOPIC
Nitrite: NEGATIVE
Specific Gravity, Urine: 1.024 (ref 1.005–1.030)
Urobilinogen, UA: 0.2 mg/dL (ref 0.0–1.0)
pH: 6 (ref 5.0–8.0)

## 2010-06-07 LAB — LIPASE, BLOOD: Lipase: 94 U/L (ref 23–300)

## 2010-06-07 LAB — DIFFERENTIAL
Basophils Relative: 0 % (ref 0–1)
Lymphocytes Relative: 18 % (ref 12–46)
Lymphs Abs: 2.2 10*3/uL (ref 0.7–4.0)
Monocytes Absolute: 0.6 10*3/uL (ref 0.1–1.0)
Monocytes Relative: 6 % (ref 3–12)
Neutro Abs: 7.1 10*3/uL (ref 1.7–7.7)
Neutro Abs: 8.2 10*3/uL — ABNORMAL HIGH (ref 1.7–7.7)
Neutrophils Relative %: 68 % (ref 43–77)

## 2010-06-07 LAB — CBC
HCT: 49 % (ref 39.0–52.0)
MCHC: 34.6 g/dL (ref 30.0–36.0)
MCV: 93 fL (ref 78.0–100.0)
Platelets: 257 10*3/uL (ref 150–400)
RBC: 5.28 MIL/uL (ref 4.22–5.81)
RDW: 13.2 % (ref 11.5–15.5)

## 2010-06-09 LAB — URINALYSIS, ROUTINE W REFLEX MICROSCOPIC
Hgb urine dipstick: NEGATIVE
Ketones, ur: NEGATIVE mg/dL
Protein, ur: NEGATIVE mg/dL
Urobilinogen, UA: 0.2 mg/dL (ref 0.0–1.0)

## 2010-06-18 IMAGING — CR DG CHEST 1V PORT
1 series · 1 of 1 positions shown · non-contrast
Comparison: 11/15/2004

CLINICAL DATA: Generalized abdominal pain.  Nausea.  Question free
intraperitoneal air.

PORTABLE CHEST - 1 VIEW

[view not recorded]
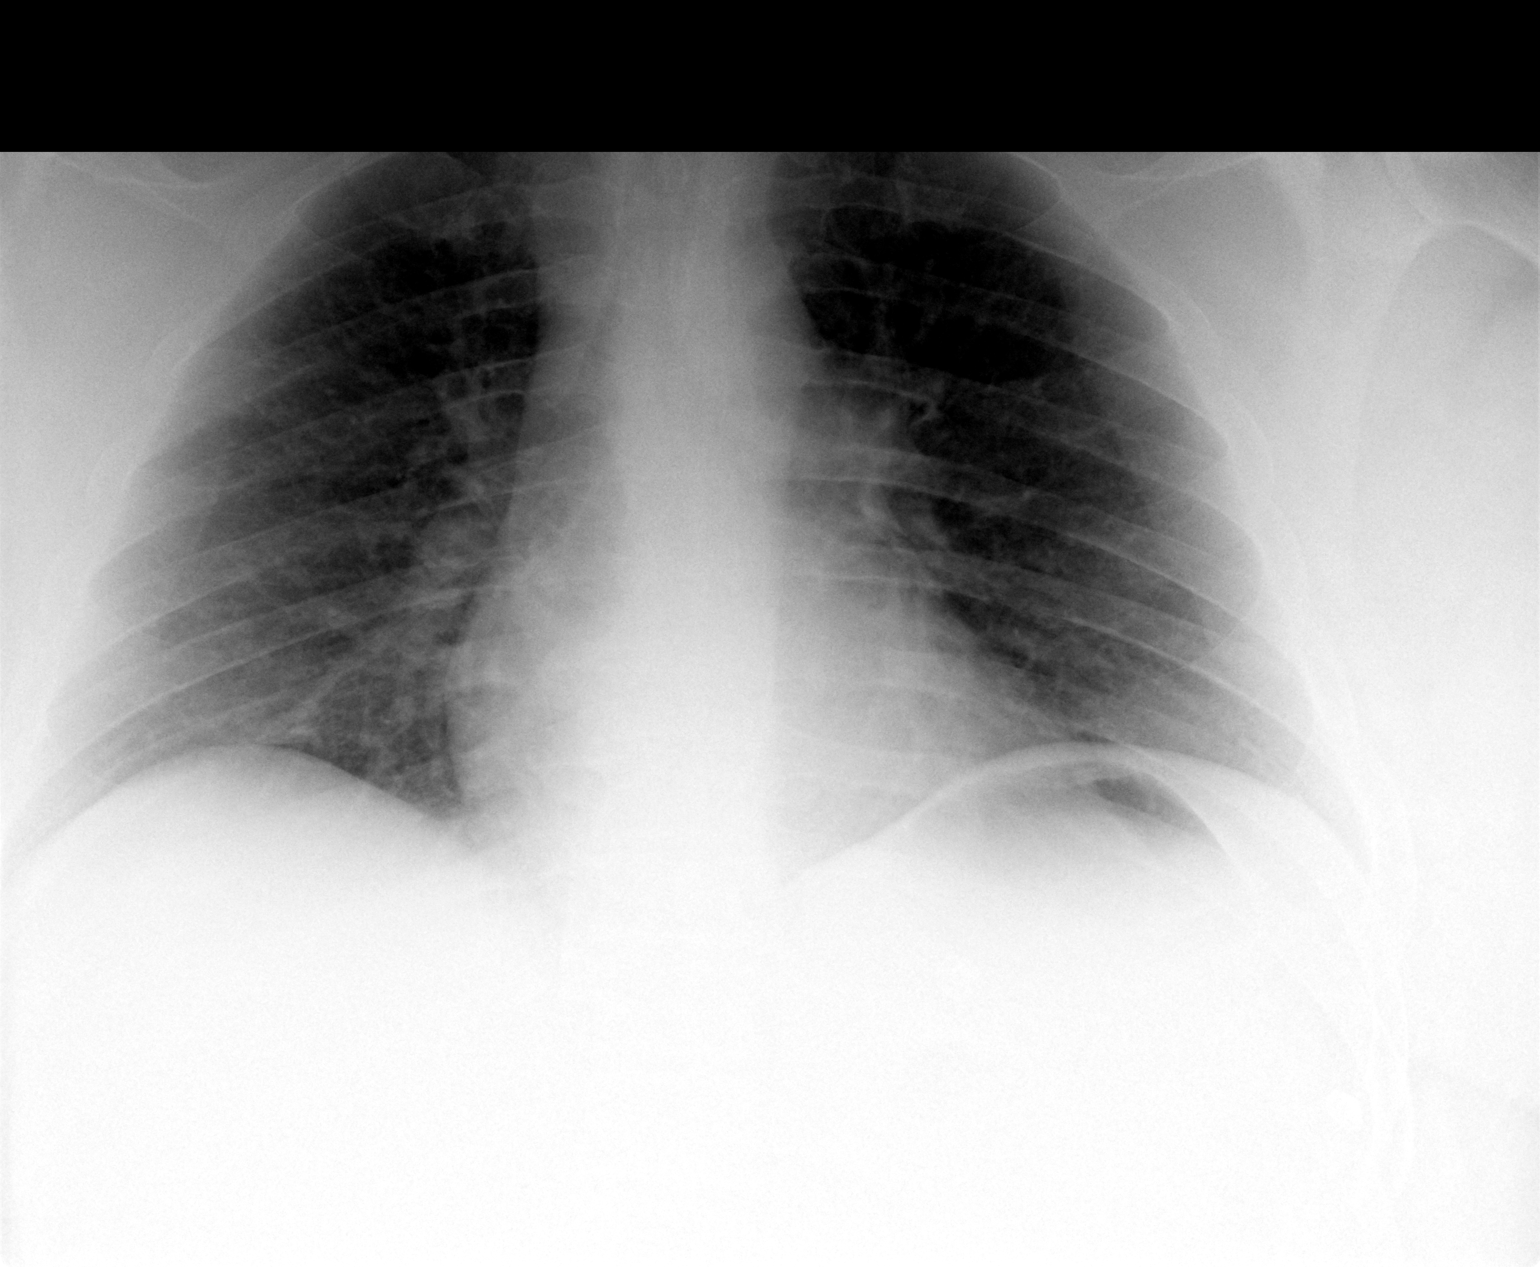

[1 of 1 positions shown; findings below may reference images not displayed]

FINDINGS: Midline trachea. Normal heart size for level of
inspiration.  No pleural effusion or pneumothorax. Low lung volumes
with resultant pulmonary interstitial prominence.  Lucency under
the left hemidiaphragm is likely within the stomach.
IMPRESSION: 1.  Low lung volumes without acute cardiopulmonary disease.
2.  Air under the left hemidiaphragm is most likely within the
stomach.  If there is high clinical concern of free intraperitoneal
air, consider dedicated abdomen series.

## 2010-06-18 IMAGING — US US ABDOMEN COMPLETE
1 series · 14 of 25 positions shown · non-contrast
Comparison: None

CLINICAL DATA: Right upper quadrant and epigastric pain

ABDOMEN ULTRASOUND
TECHNIQUE: Routine

[Series 1: us abdomen complete · 0.43mm/px · 14 of 64 slices shown]
[im 1/64]
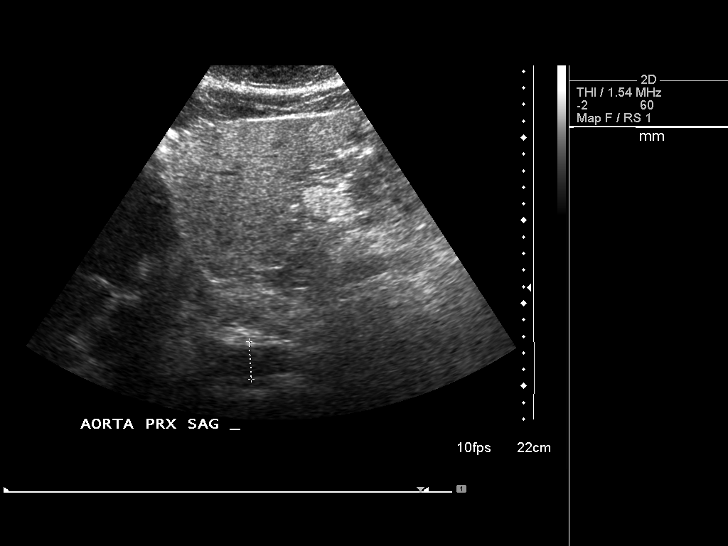
[im 6/64]
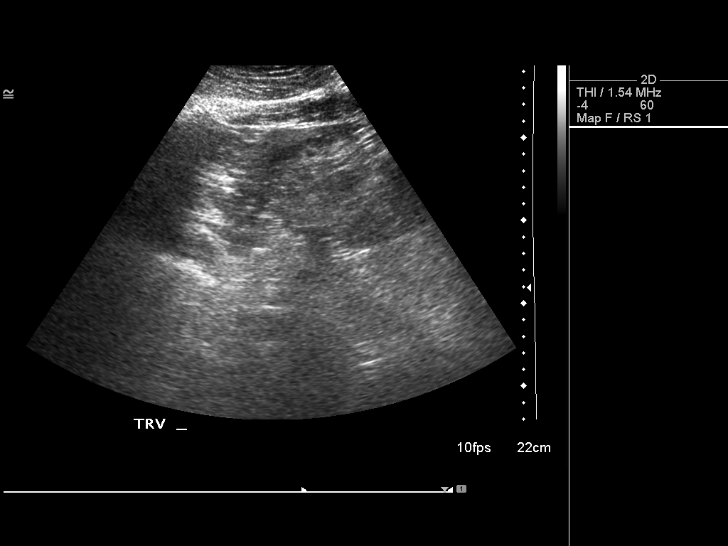
[im 11/64]
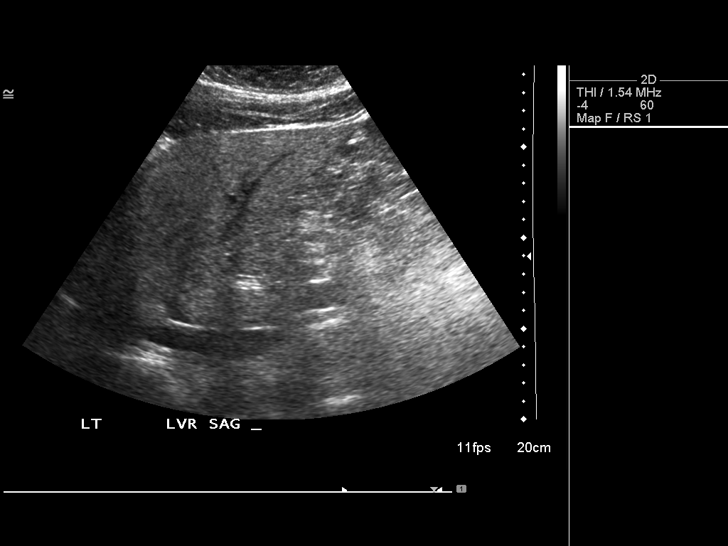
[im 16/64]
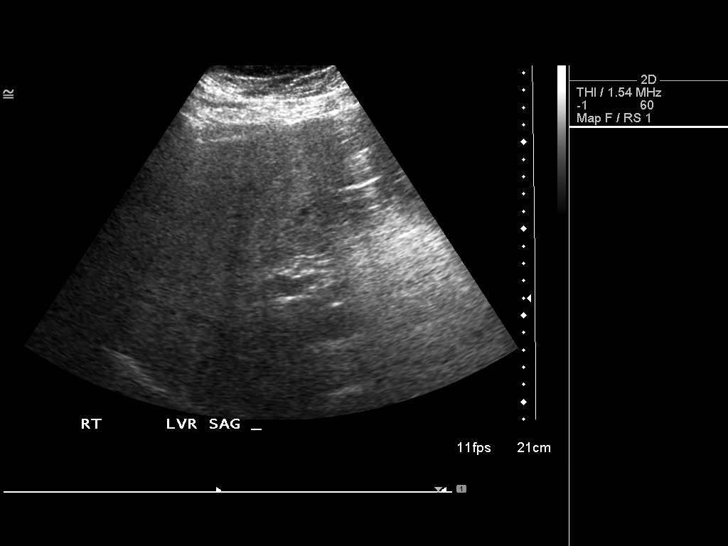
[im 22/64]
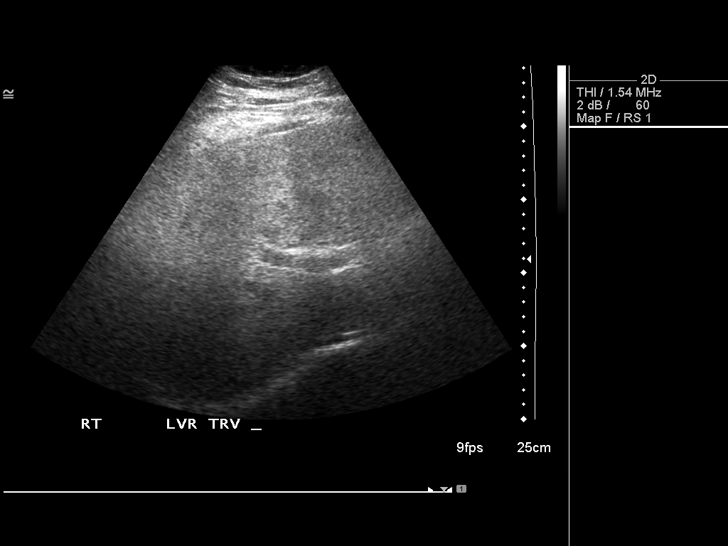
[im 24/64]
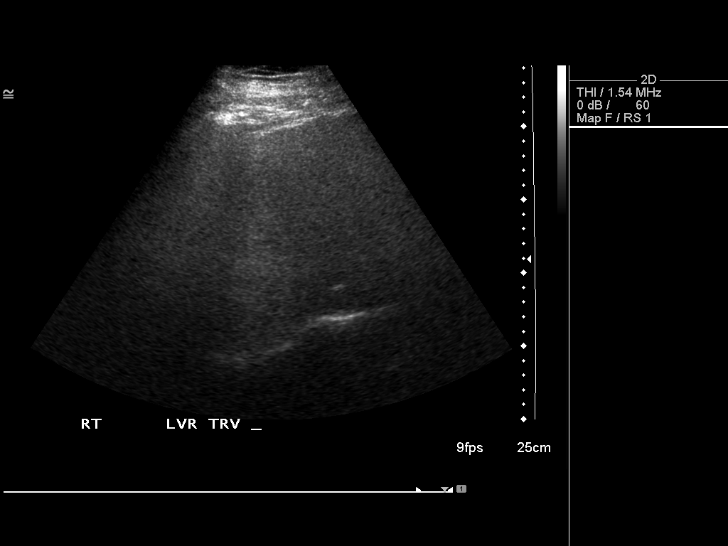
[im 29/64]
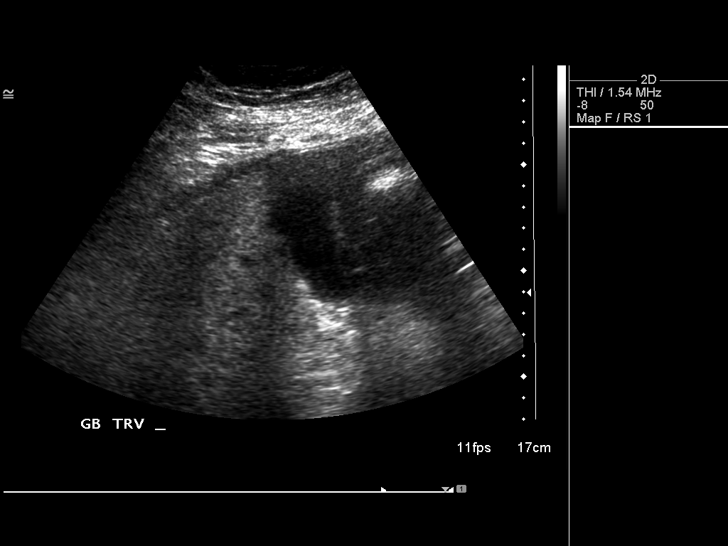
[im 35/64]
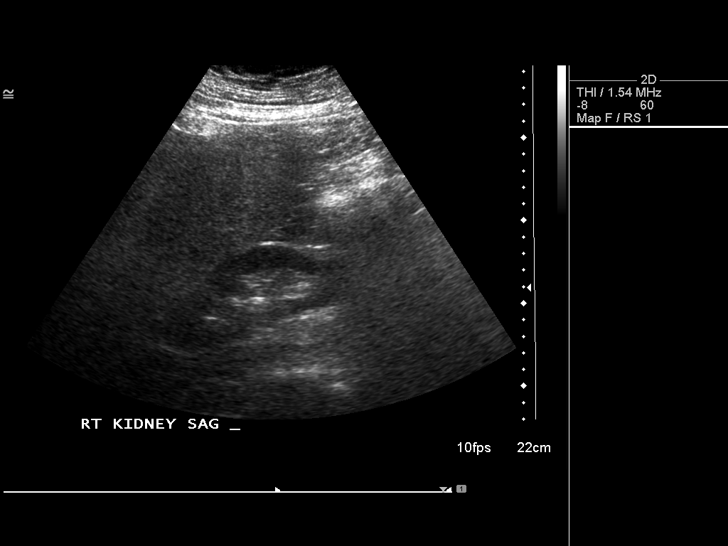
[im 40/64]
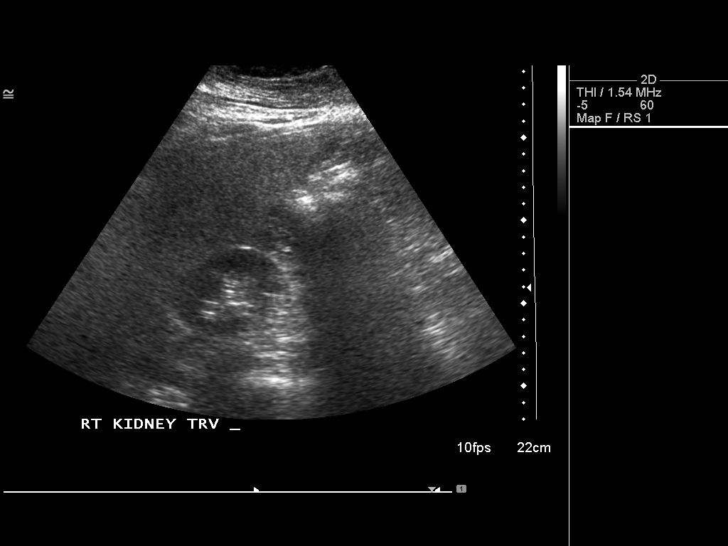
[im 43/64]
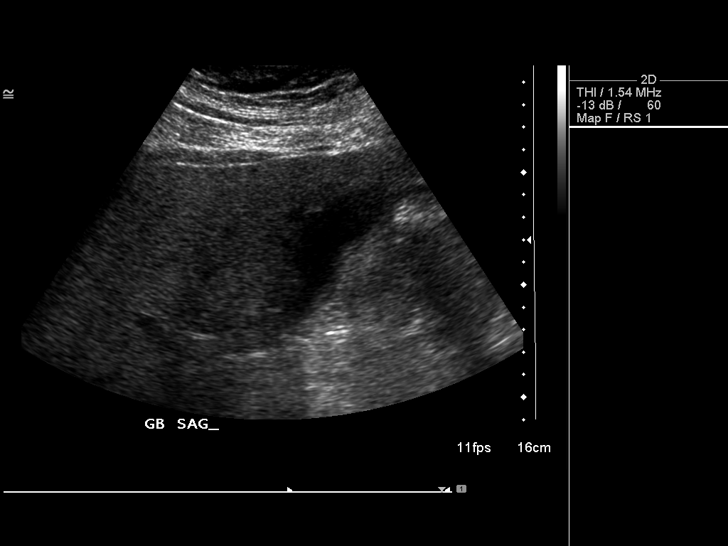
[im 48/64]
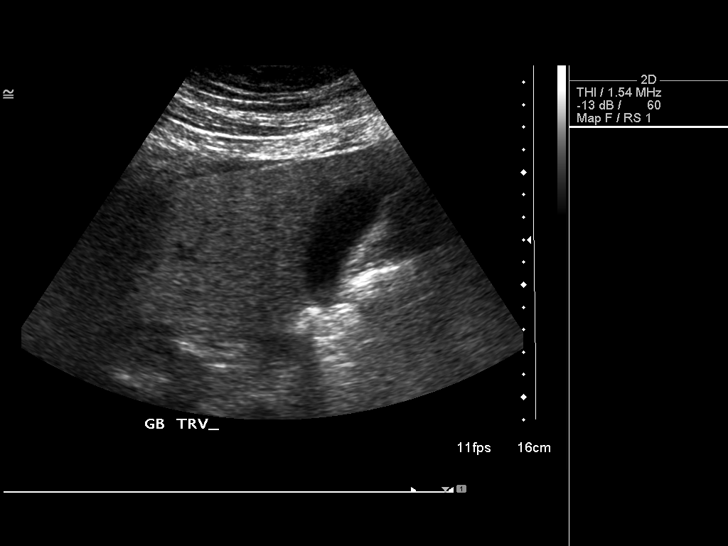
[im 53/64]
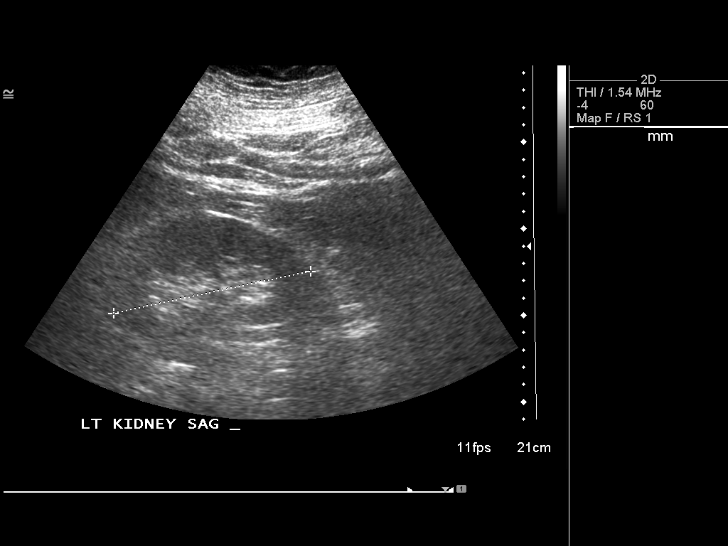
[im 58/64]
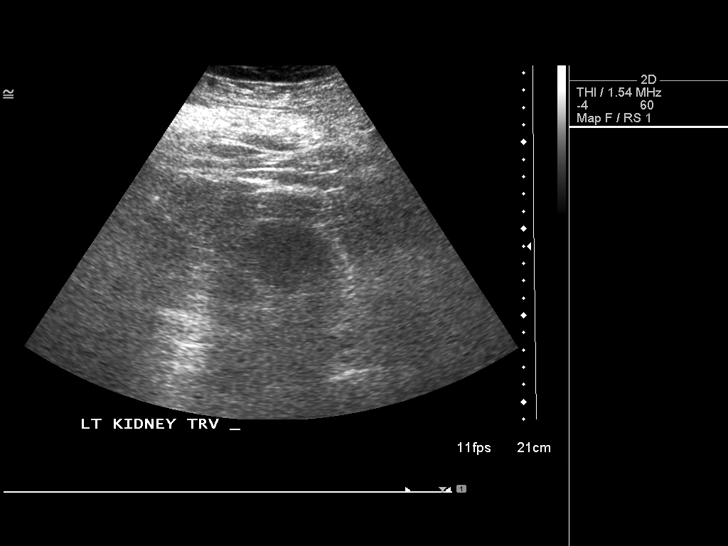
[im 64/64]
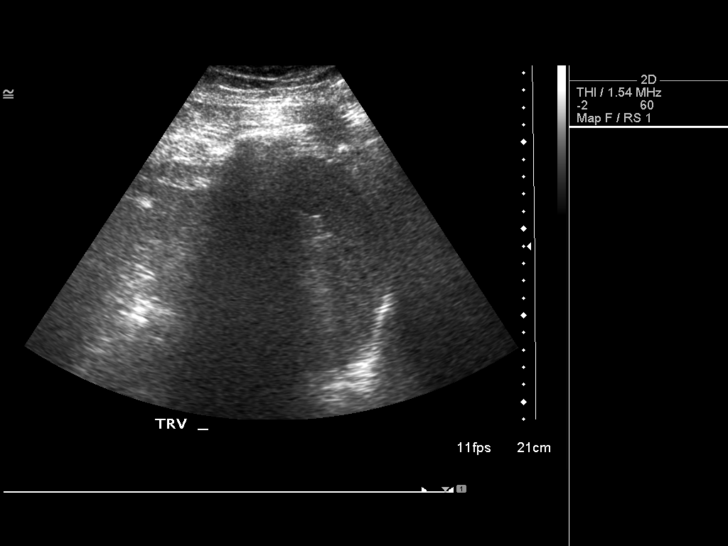

[14 of 25 positions shown; findings below may reference images not displayed]

FINDINGS: This study is limited technically due to the patient's
large body habitus, and the presence of bowel gas in the
epigastrium.

The gallbladder is normal in size and contour.  There are no
obvious stones.  There are internal echoes that could be an
artifact of large body habitus, but biliary sludge cannot be
excluded.

No definite dilatation of the intrahepatic bile ducts or the common
duct.  Increased echogenicity of the liver without focal lesions.
Spleen normal.

Visualization of the aorta, IVC, pancreas, and both kidneys is
significantly limited.  There is no obvious hydronephrosis.
IMPRESSION: 1.  The exam is limited technically due to body habitus and bowel
gas.
2.  No definite gallstones but biliary sludge cannot be excluded -
this may be an artifact related to large body habitus.
3.  No obvious dilatation of the bile ducts.
4.  No other pathological findings on this limited exam.

## 2010-07-03 ENCOUNTER — Emergency Department (HOSPITAL_COMMUNITY)
Admission: EM | Admit: 2010-07-03 | Discharge: 2010-07-03 | Discharge status: Against Medical Advice | Payer: Medicare Other | Attending: Emergency Medicine | Admitting: Emergency Medicine

## 2010-07-03 ENCOUNTER — Emergency Department (HOSPITAL_COMMUNITY): Payer: Medicare Other

## 2010-07-03 DIAGNOSIS — M549 Dorsalgia, unspecified: Secondary | ICD-10-CM | POA: Insufficient documentation

## 2010-07-03 DIAGNOSIS — F988 Other specified behavioral and emotional disorders with onset usually occurring in childhood and adolescence: Secondary | ICD-10-CM | POA: Insufficient documentation

## 2010-07-03 DIAGNOSIS — R42 Dizziness and giddiness: Secondary | ICD-10-CM | POA: Insufficient documentation

## 2010-07-03 DIAGNOSIS — I10 Essential (primary) hypertension: Secondary | ICD-10-CM | POA: Insufficient documentation

## 2010-07-03 DIAGNOSIS — R0789 Other chest pain: Secondary | ICD-10-CM | POA: Insufficient documentation

## 2010-07-03 DIAGNOSIS — E119 Type 2 diabetes mellitus without complications: Secondary | ICD-10-CM | POA: Insufficient documentation

## 2010-07-03 DIAGNOSIS — I498 Other specified cardiac arrhythmias: Secondary | ICD-10-CM | POA: Insufficient documentation

## 2010-07-03 DIAGNOSIS — G4730 Sleep apnea, unspecified: Secondary | ICD-10-CM | POA: Insufficient documentation

## 2010-07-03 LAB — DIFFERENTIAL
Basophils Relative: 0 % (ref 0–1)
Eosinophils Absolute: 0.3 10*3/uL (ref 0.0–0.7)
Eosinophils Relative: 3 % (ref 0–5)
Monocytes Relative: 7 % (ref 3–12)
Neutrophils Relative %: 64 % (ref 43–77)

## 2010-07-03 LAB — COMPREHENSIVE METABOLIC PANEL
Albumin: 3.7 g/dL (ref 3.5–5.2)
Alkaline Phosphatase: 67 U/L (ref 39–117)
BUN: 8 mg/dL (ref 6–23)
Calcium: 9.4 mg/dL (ref 8.4–10.5)
Creatinine, Ser: 0.85 mg/dL (ref 0.4–1.5)
Glucose, Bld: 134 mg/dL — ABNORMAL HIGH (ref 70–99)
Potassium: 3.5 mEq/L (ref 3.5–5.1)
Total Protein: 7.1 g/dL (ref 6.0–8.3)

## 2010-07-03 LAB — CBC
MCH: 30.9 pg (ref 26.0–34.0)
MCV: 89.7 fL (ref 78.0–100.0)
Platelets: 227 10*3/uL (ref 150–400)
RBC: 4.85 MIL/uL (ref 4.22–5.81)
RDW: 12.5 % (ref 11.5–15.5)

## 2010-07-03 LAB — PRO B NATRIURETIC PEPTIDE: Pro B Natriuretic peptide (BNP): 57.9 pg/mL (ref 0–125)

## 2010-07-03 LAB — LIPASE, BLOOD: Lipase: 16 U/L (ref 11–59)
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
## 2010-08-08 ENCOUNTER — Emergency Department (HOSPITAL_COMMUNITY)
Admission: EM | Admit: 2010-08-08 | Discharge: 2010-08-08 | Disposition: A | Discharge status: Home / Self Care | Payer: Medicare Other | Attending: Emergency Medicine | Admitting: Emergency Medicine

## 2010-08-08 DIAGNOSIS — Z79899 Other long term (current) drug therapy: Secondary | ICD-10-CM | POA: Insufficient documentation

## 2010-08-08 DIAGNOSIS — M545 Low back pain, unspecified: Secondary | ICD-10-CM | POA: Insufficient documentation

## 2010-08-08 DIAGNOSIS — M549 Dorsalgia, unspecified: Secondary | ICD-10-CM | POA: Insufficient documentation

## 2010-08-08 DIAGNOSIS — E119 Type 2 diabetes mellitus without complications: Secondary | ICD-10-CM | POA: Insufficient documentation

## 2010-08-08 DIAGNOSIS — I10 Essential (primary) hypertension: Secondary | ICD-10-CM | POA: Insufficient documentation

## 2010-08-08 DIAGNOSIS — F329 Major depressive disorder, single episode, unspecified: Secondary | ICD-10-CM | POA: Insufficient documentation

## 2010-08-08 DIAGNOSIS — F3289 Other specified depressive episodes: Secondary | ICD-10-CM | POA: Insufficient documentation

## 2010-08-19 ENCOUNTER — Emergency Department (HOSPITAL_BASED_OUTPATIENT_CLINIC_OR_DEPARTMENT_OTHER)
Admission: EM | Admit: 2010-08-19 | Discharge: 2010-08-19 | Disposition: A | Discharge status: Home / Self Care | Payer: No Typology Code available for payment source | Attending: Emergency Medicine | Admitting: Emergency Medicine

## 2010-08-19 ENCOUNTER — Emergency Department (INDEPENDENT_AMBULATORY_CARE_PROVIDER_SITE_OTHER): Payer: No Typology Code available for payment source

## 2010-08-19 DIAGNOSIS — M25569 Pain in unspecified knee: Secondary | ICD-10-CM

## 2010-08-19 DIAGNOSIS — M542 Cervicalgia: Secondary | ICD-10-CM

## 2010-08-19 DIAGNOSIS — R51 Headache: Secondary | ICD-10-CM

## 2010-08-19 DIAGNOSIS — Y92410 Unspecified street and highway as the place of occurrence of the external cause: Secondary | ICD-10-CM | POA: Insufficient documentation

## 2010-08-19 DIAGNOSIS — I10 Essential (primary) hypertension: Secondary | ICD-10-CM | POA: Insufficient documentation

## 2010-08-19 DIAGNOSIS — E119 Type 2 diabetes mellitus without complications: Secondary | ICD-10-CM | POA: Insufficient documentation

## 2010-08-19 DIAGNOSIS — M545 Low back pain: Secondary | ICD-10-CM

## 2010-08-19 DIAGNOSIS — F988 Other specified behavioral and emotional disorders with onset usually occurring in childhood and adolescence: Secondary | ICD-10-CM | POA: Insufficient documentation

## 2010-08-19 DIAGNOSIS — M549 Dorsalgia, unspecified: Secondary | ICD-10-CM | POA: Insufficient documentation

## 2010-08-21 LAB — GLUCOSE, CAPILLARY: Glucose-Capillary: 177 mg/dL — ABNORMAL HIGH (ref 70–99)

## 2010-08-26 ENCOUNTER — Emergency Department (HOSPITAL_COMMUNITY)
Admission: EM | Admit: 2010-08-26 | Discharge: 2010-08-27 | Disposition: A | Discharge status: Home / Self Care | Payer: Medicare Other | Attending: Emergency Medicine | Admitting: Emergency Medicine

## 2010-08-26 ENCOUNTER — Emergency Department (HOSPITAL_COMMUNITY): Payer: Medicare Other

## 2010-08-26 DIAGNOSIS — F329 Major depressive disorder, single episode, unspecified: Secondary | ICD-10-CM | POA: Insufficient documentation

## 2010-08-26 DIAGNOSIS — E119 Type 2 diabetes mellitus without complications: Secondary | ICD-10-CM | POA: Insufficient documentation

## 2010-08-26 DIAGNOSIS — G4730 Sleep apnea, unspecified: Secondary | ICD-10-CM | POA: Insufficient documentation

## 2010-08-26 DIAGNOSIS — M542 Cervicalgia: Secondary | ICD-10-CM | POA: Insufficient documentation

## 2010-08-26 DIAGNOSIS — F3289 Other specified depressive episodes: Secondary | ICD-10-CM | POA: Insufficient documentation

## 2010-08-26 DIAGNOSIS — M25569 Pain in unspecified knee: Secondary | ICD-10-CM | POA: Insufficient documentation

## 2010-08-26 DIAGNOSIS — I10 Essential (primary) hypertension: Secondary | ICD-10-CM | POA: Insufficient documentation

## 2010-08-26 DIAGNOSIS — R209 Unspecified disturbances of skin sensation: Secondary | ICD-10-CM | POA: Insufficient documentation

## 2010-08-26 DIAGNOSIS — F988 Other specified behavioral and emotional disorders with onset usually occurring in childhood and adolescence: Secondary | ICD-10-CM | POA: Insufficient documentation

## 2010-08-26 DIAGNOSIS — R51 Headache: Secondary | ICD-10-CM | POA: Insufficient documentation

## 2010-08-26 LAB — BASIC METABOLIC PANEL
CO2: 29 mEq/L (ref 19–32)
Calcium: 9.3 mg/dL (ref 8.4–10.5)
Creatinine, Ser: 0.73 mg/dL (ref 0.50–1.35)

## 2010-08-26 LAB — DIFFERENTIAL
Eosinophils Absolute: 0.3 10*3/uL (ref 0.0–0.7)
Eosinophils Relative: 3 % (ref 0–5)
Lymphs Abs: 2.8 10*3/uL (ref 0.7–4.0)
Monocytes Absolute: 0.8 10*3/uL (ref 0.1–1.0)
Monocytes Relative: 8 % (ref 3–12)
Neutrophils Relative %: 60 % (ref 43–77)

## 2010-08-26 LAB — CBC
MCH: 30.3 pg (ref 26.0–34.0)
MCHC: 33.4 g/dL (ref 30.0–36.0)
MCV: 90.6 fL (ref 78.0–100.0)
Platelets: 240 10*3/uL (ref 150–400)
RBC: 4.99 MIL/uL (ref 4.22–5.81)

## 2010-08-26 LAB — CK TOTAL AND CKMB (NOT AT ARMC): Total CK: 152 U/L (ref 7–232)

## 2010-08-27 ENCOUNTER — Encounter (HOSPITAL_COMMUNITY): Payer: Self-pay

## 2010-09-22 ENCOUNTER — Emergency Department (HOSPITAL_COMMUNITY): Payer: Medicare Other

## 2010-09-22 ENCOUNTER — Emergency Department (HOSPITAL_COMMUNITY)
Admission: EM | Admit: 2010-09-22 | Discharge: 2010-09-22 | Disposition: A | Discharge status: Home / Self Care | Payer: Medicare Other | Attending: Emergency Medicine | Admitting: Emergency Medicine

## 2010-09-22 DIAGNOSIS — E119 Type 2 diabetes mellitus without complications: Secondary | ICD-10-CM | POA: Insufficient documentation

## 2010-09-22 DIAGNOSIS — Z79899 Other long term (current) drug therapy: Secondary | ICD-10-CM | POA: Insufficient documentation

## 2010-09-22 DIAGNOSIS — R110 Nausea: Secondary | ICD-10-CM | POA: Insufficient documentation

## 2010-09-22 DIAGNOSIS — G8929 Other chronic pain: Secondary | ICD-10-CM | POA: Insufficient documentation

## 2010-09-22 DIAGNOSIS — F319 Bipolar disorder, unspecified: Secondary | ICD-10-CM | POA: Insufficient documentation

## 2010-09-22 DIAGNOSIS — J45909 Unspecified asthma, uncomplicated: Secondary | ICD-10-CM | POA: Insufficient documentation

## 2010-09-22 DIAGNOSIS — R1031 Right lower quadrant pain: Secondary | ICD-10-CM | POA: Insufficient documentation

## 2010-09-22 DIAGNOSIS — M549 Dorsalgia, unspecified: Secondary | ICD-10-CM | POA: Insufficient documentation

## 2010-09-22 DIAGNOSIS — E785 Hyperlipidemia, unspecified: Secondary | ICD-10-CM | POA: Insufficient documentation

## 2010-09-22 LAB — COMPREHENSIVE METABOLIC PANEL
BUN: 10 mg/dL (ref 6–23)
CO2: 26 mEq/L (ref 19–32)
Chloride: 98 mEq/L (ref 96–112)
Creatinine, Ser: 0.98 mg/dL (ref 0.50–1.35)
GFR calc non Af Amer: >60 mL/min (ref >60)
Total Bilirubin: 0.4 mg/dL (ref 0.3–1.2)

## 2010-09-22 LAB — DIFFERENTIAL
Lymphs Abs: 2.7 10*3/uL (ref 0.7–4.0)
Monocytes Relative: 7 % (ref 3–12)
Neutro Abs: 6.7 10*3/uL (ref 1.7–7.7)
Neutrophils Relative %: 64 % (ref 43–77)

## 2010-09-22 LAB — CBC
Hemoglobin: 16 g/dL (ref 13.0–17.0)
MCH: 30.5 pg (ref 26.0–34.0)
MCV: 89.9 fL (ref 78.0–100.0)
RBC: 5.25 MIL/uL (ref 4.22–5.81)

## 2010-09-22 LAB — RAPID URINE DRUG SCREEN, HOSP PERFORMED
Opiates: POSITIVE — AB
Tetrahydrocannabinol: NOT DETECTED

## 2010-09-22 LAB — ETHANOL: Alcohol, Ethyl (B): <11 mg/dL (ref 0–11)

## 2010-10-15 IMAGING — CR DG CHEST 2V
2 series · 2 of 2 positions shown · non-contrast
Comparison: 09/18/2008.

CLINICAL DATA: Pre operative respiratory exam.

CHEST - 2 VIEW

[view not recorded (1 of 2)]
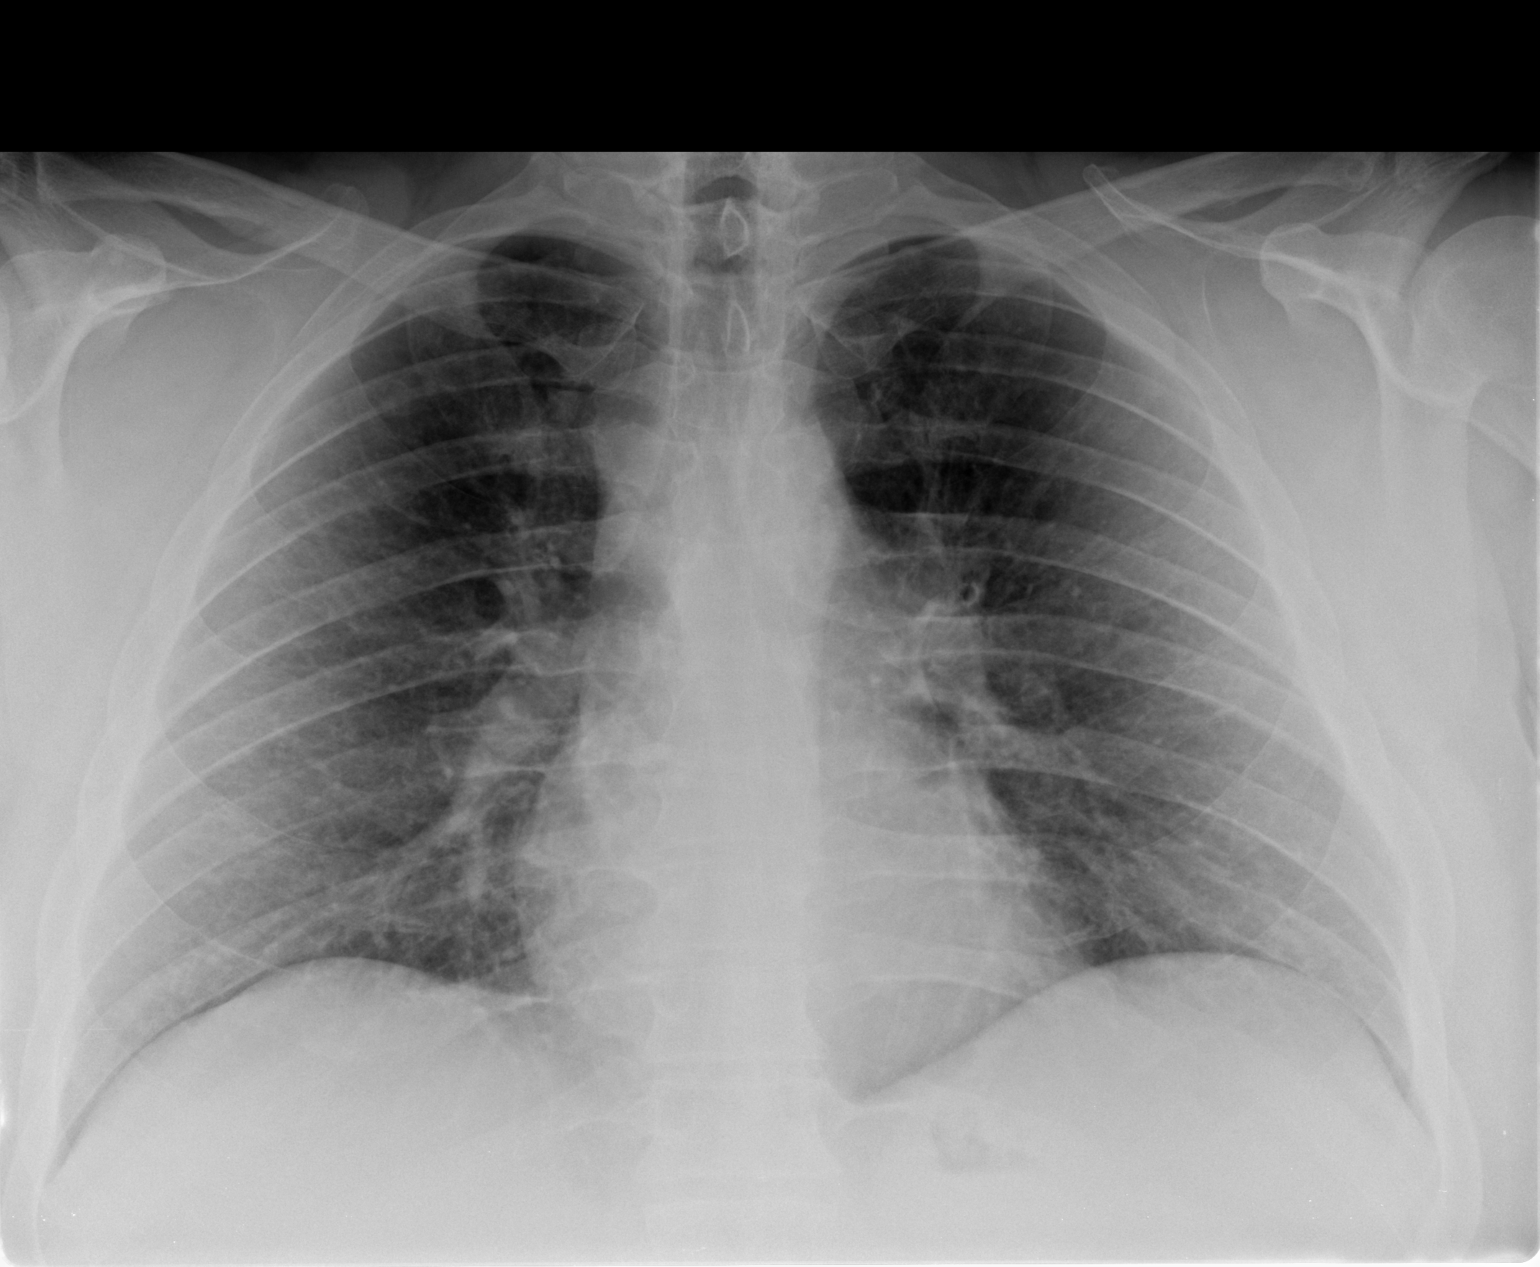

[view not recorded (2 of 2)]
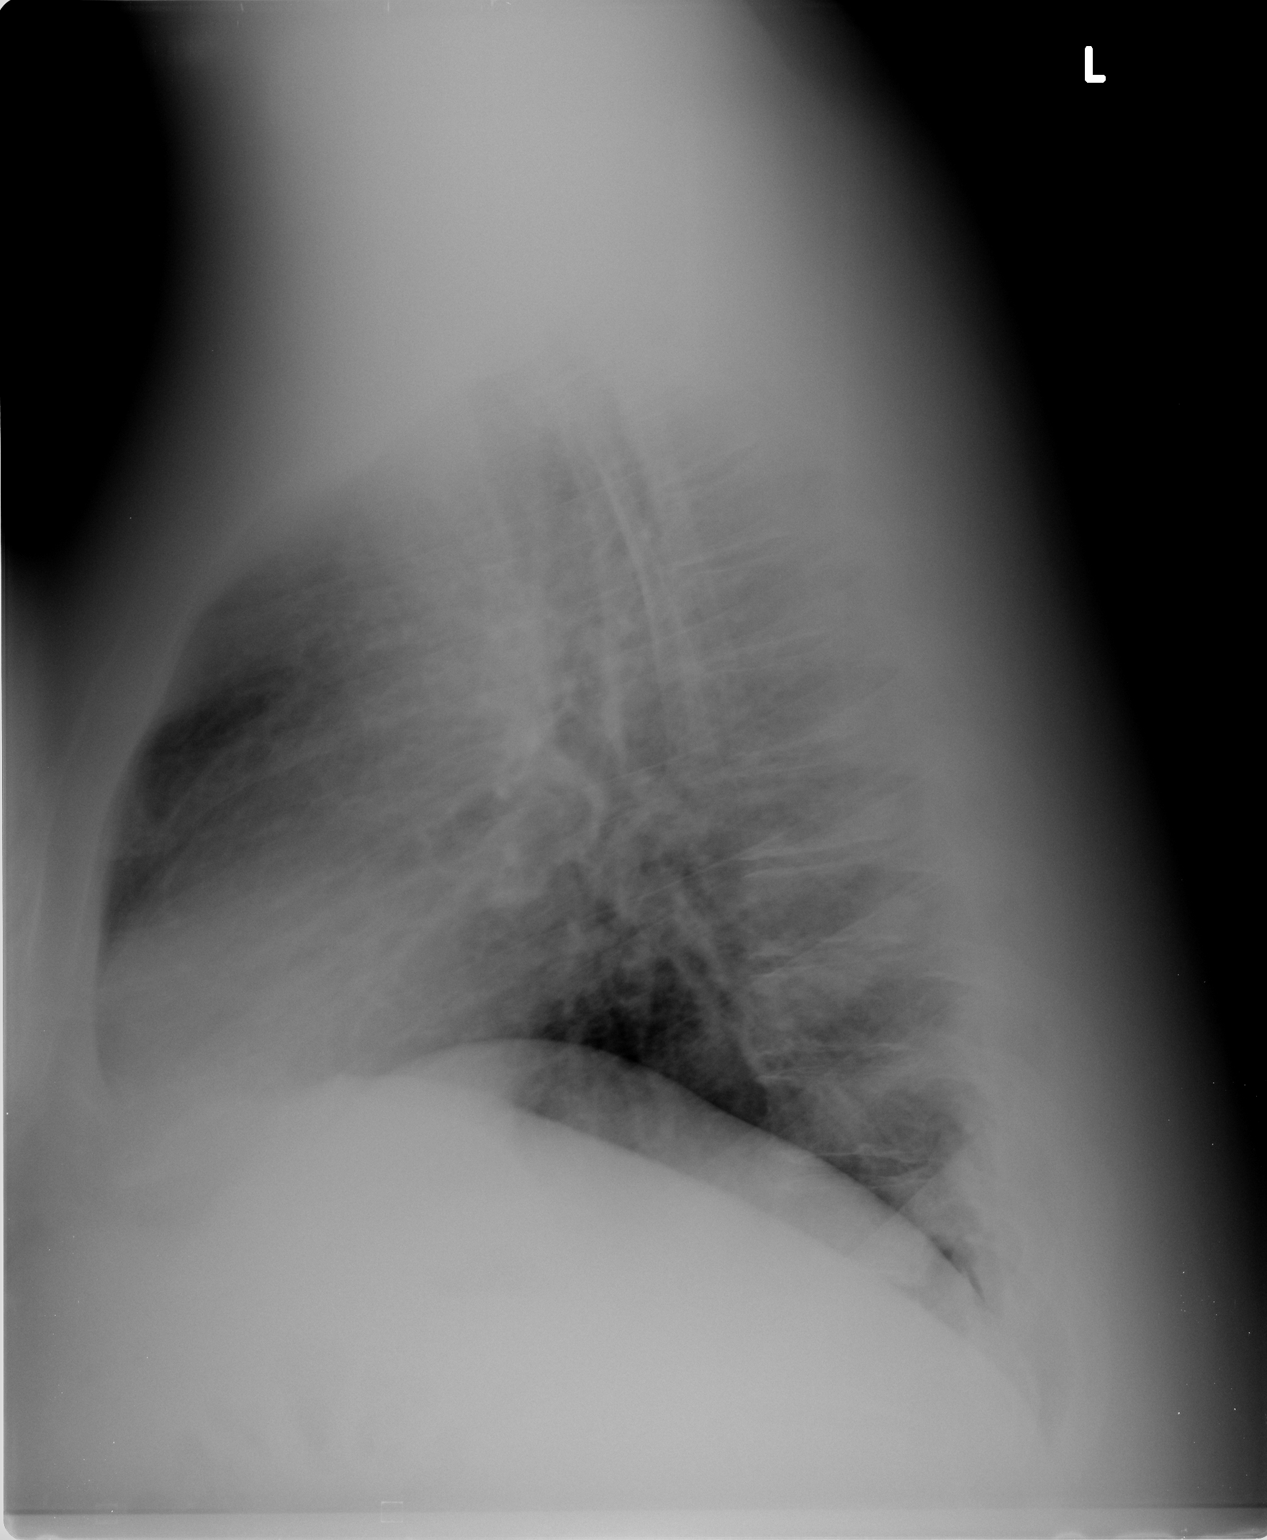

[2 of 2 positions shown; findings below may reference images not displayed]

FINDINGS: Heart size is normal.  There is no heart failure.  The
lungs are clear without infiltrate or effusion.
IMPRESSION: No active cardiopulmonary disease.

## 2010-12-03 IMAGING — MR MR [PERSON_NAME] UP JT W/O CM*L*
6 series · 16 of 16 positions shown · non-contrast
Comparison: None.

CLINICAL DATA: Left wrist pain from the Ju[place] to the wrist.  History
of carpal tunnel surgery January 28, 2009.  Increasing pain.

MRI LEFT WRIST WITHOUT CONTRAST
TECHNIQUE: Multiplanar, multisequence MR imaging of the left wrist
was performed.  No intravenous contrast was administered.

[Series 4: T1 · axial · 3.0mm · 0.16mm/px · z∈[-47,+5]mm · 3 of 19 slices shown (1 of 2)]
[im 1/19]
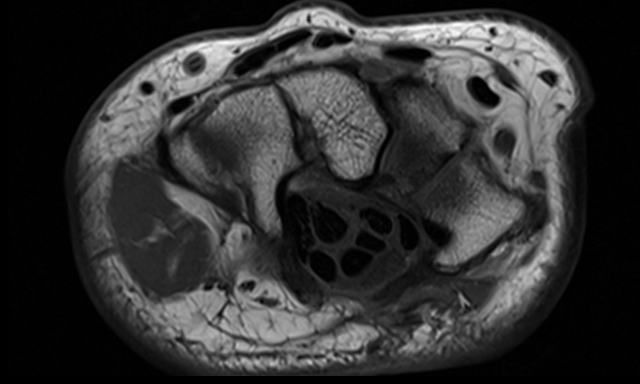
[im 10/19]
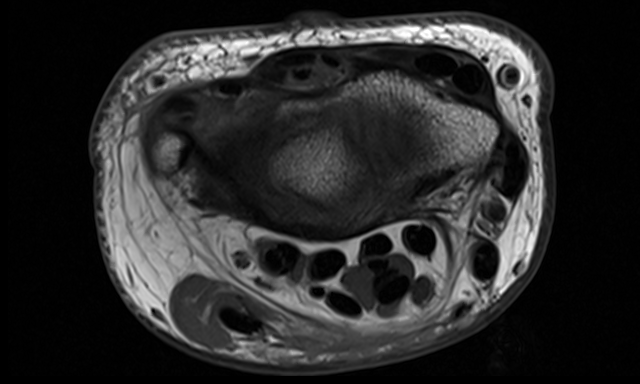
[im 19/19]
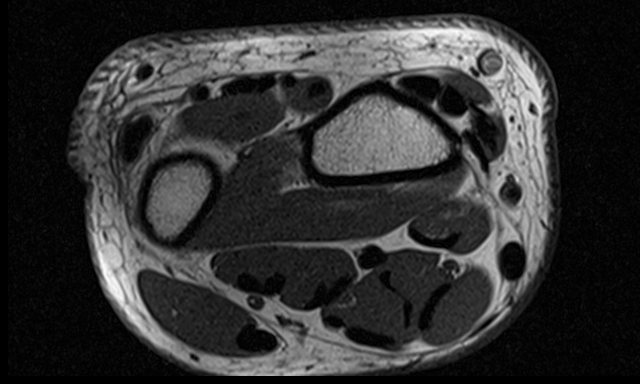

[Series 5: T2 fat-sat · axial · 3.0mm · 0.20mm/px · z∈[-46,+5]mm · 3 of 19 slices shown (1 of 2)]
[im 1/19]
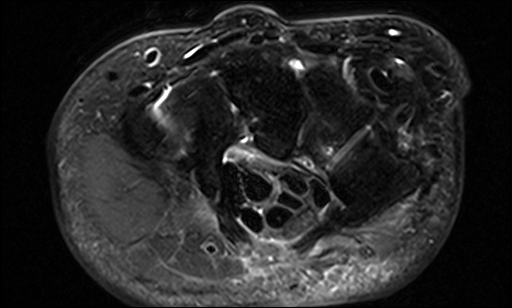
[im 10/19]
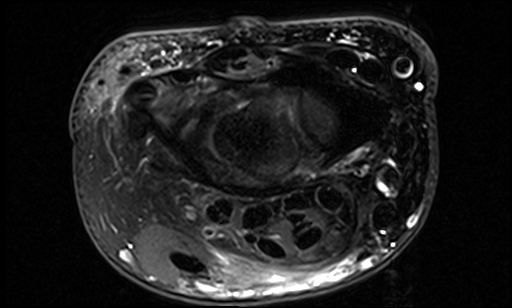
[im 19/19]
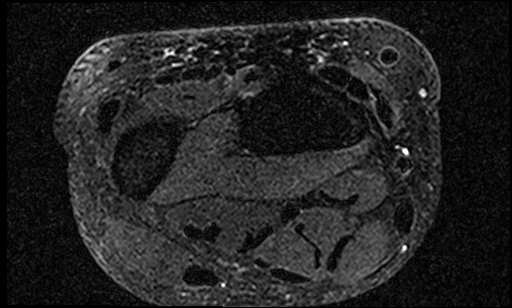

[Series 6: PD fat-sat · coronal · 3.0mm · 0.21mm/px · 2 of 11 slices shown (1 of 2)]
[im 1/11]
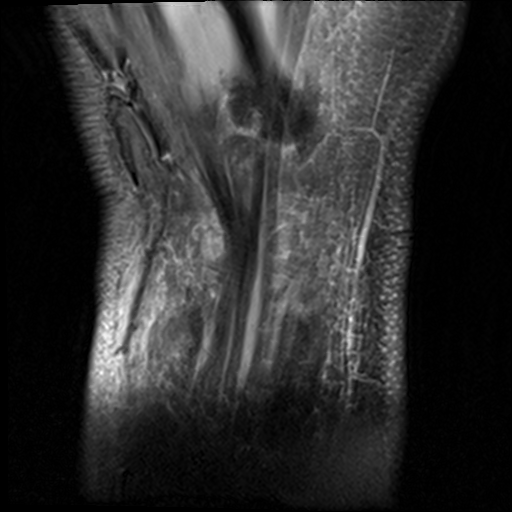
[im 11/11]
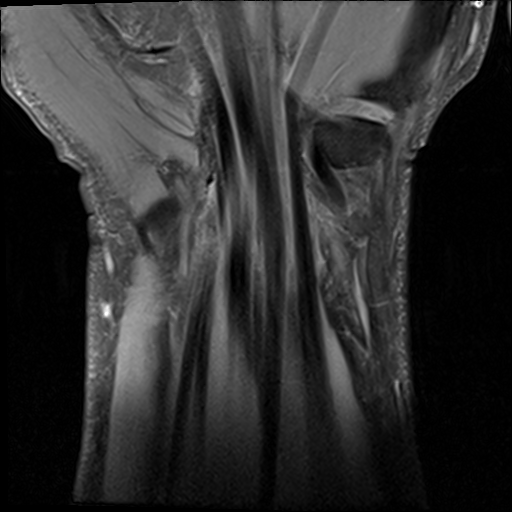

[Series 7: T2 fat-sat · coronal · 3.0mm · 0.20mm/px · 2 of 11 slices shown (2 of 2)]
[im 1/11]
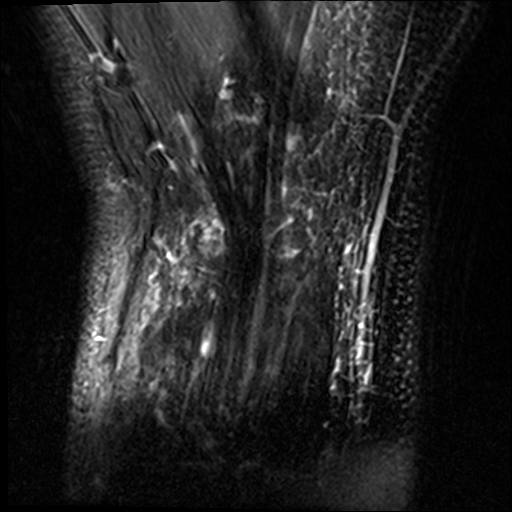
[im 11/11]
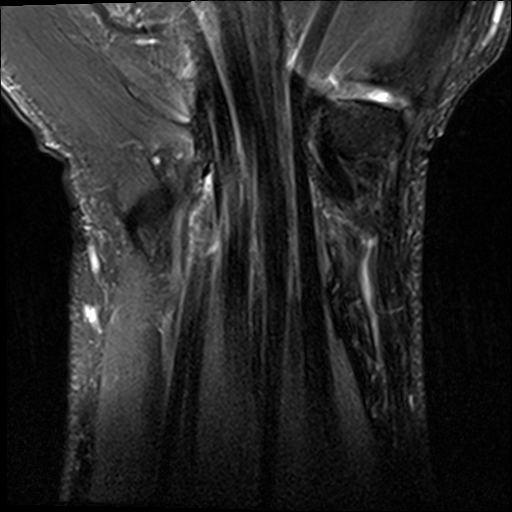

[Series 8: PD fat-sat · sagittal · 3.0mm · 0.21mm/px · 4 of 21 slices shown (2 of 2)]
[im 1/21]
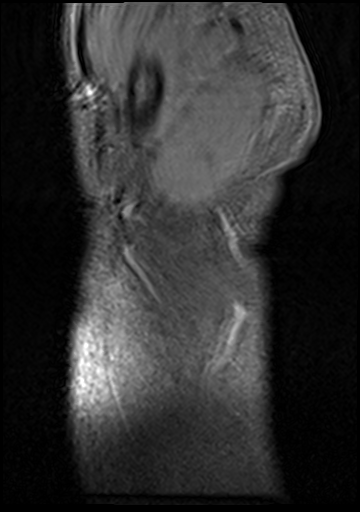
[im 7/21]
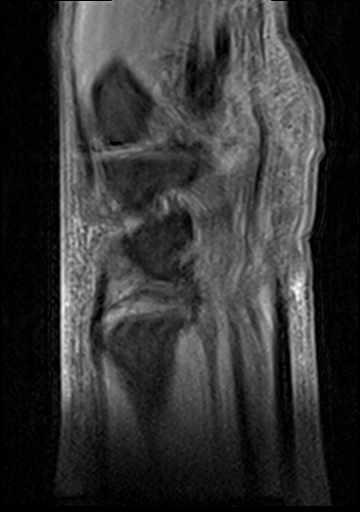
[im 14/21]
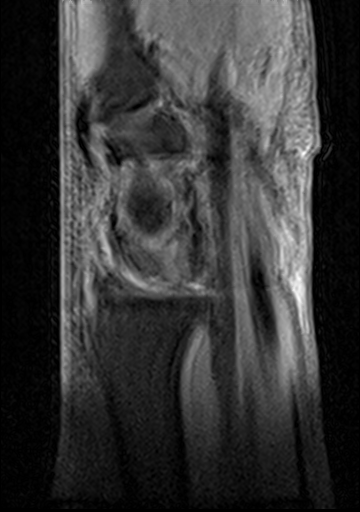
[im 21/21]
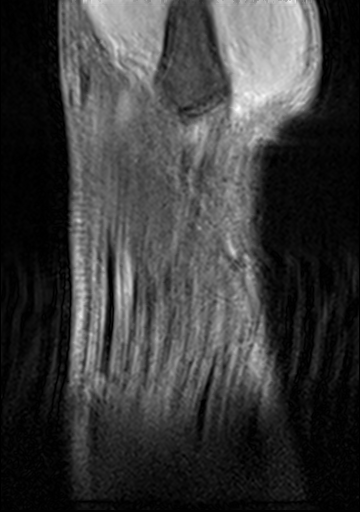

[Series 9: T1 · coronal · 3.0mm · 0.17mm/px · 2 of 11 slices shown (2 of 2)]
[im 1/11]
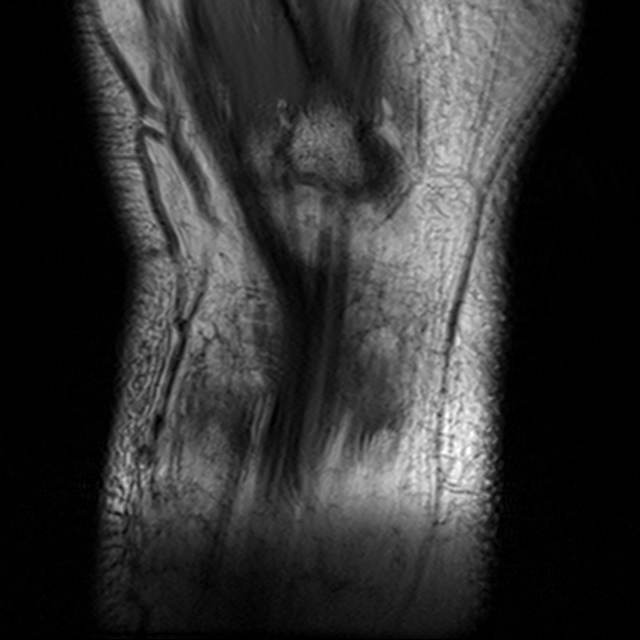
[im 11/11]
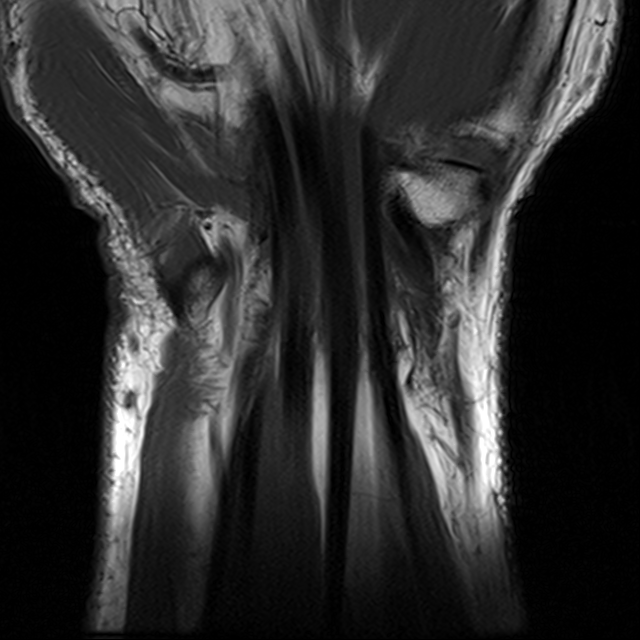

[16 of 16 positions shown; findings below may reference images not displayed]

FINDINGS: Edema and inflammatory changes are present in the volar
aspect of the wrist, extending to the carpal tunnel consistent with
carpal tunnel release.  The flexor retinaculum is discontinuous
centrally, consistent with release. Thickening of the flexor tendon
sheaths within the carpal tunnel consistent with mild synovitis.
There is enlargement and edema of the median nerve which may be
postoperative or represent neuritis. Ulnar minus variance is
present.  Degenerative signal changes present within the triangular
fibrocartilage.  No full-thickness TFCC tear is identified.
Lunotriquetral ligament appears intact.  There is partial tearing
of the scapholunate ligament centrally.  Dorsal band appears
intact.  The extensor compartments appear within normal limits.
There is a tiny ganglion arising from the dorsal aspect of the
distal radial ulnar joint that measures 5 mm transverse x 2 mm
volar to dorsal x 9 mm long axis of the wrist. Sagittal images are
motion degraded. Another tiny ganglion arises from the posterior
aspect of the wrist joint capsule, measuring 8 mm transverse,
cm volar to dorsal and 5 mm long axis of the wrist.  No significant
mass effect.
IMPRESSION: 1.  Status post carpal tunnel release.  Synovial thickening in the
flexor tendon sheaths of the carpal tunnel. Enlargement and mild
edema of the median nerve which may be postoperative or represent
median neuritis.
2.  Partial thickness central scapholunate ligament tear.
3.  Degenerative signal changes in the peripheral triangular
fibrocartilage.
4.  Small dorsal distal radial ulnar joint and dorsal wrist
ganglia.

## 2010-12-03 IMAGING — MR MR LUMBAR SPINE W/O CM
4 of 6 series · 26 of 48 positions shown · non-contrast
Comparison: 07/27/2008

CLINICAL DATA: Low back pain.  Right leg pain.

MRI LUMBAR SPINE WITHOUT CONTRAST
TECHNIQUE: Multiplanar and multiecho pulse sequences of the lumbar
spine were obtained without intravenous contrast.

[Series 3: T2 · sagittal · 4.0mm · 0.55mm/px · 5 of 12 slices shown (1 of 2)]
[im 1/12]
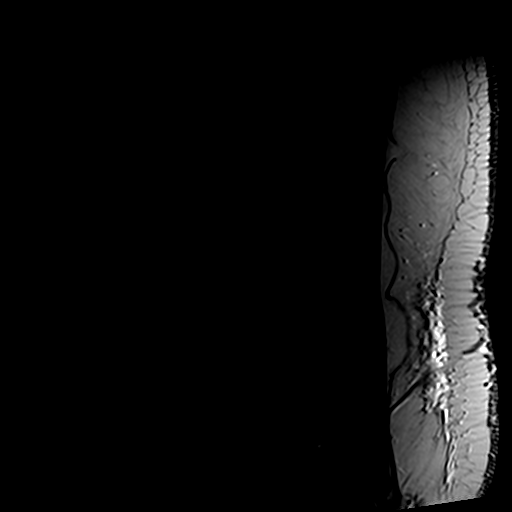
[im 3/12]
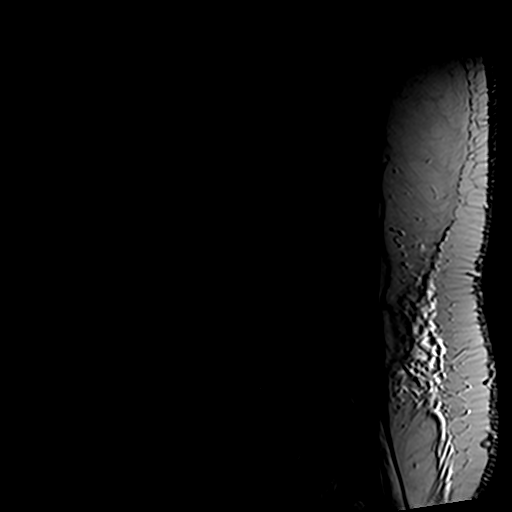
[im 6/12]
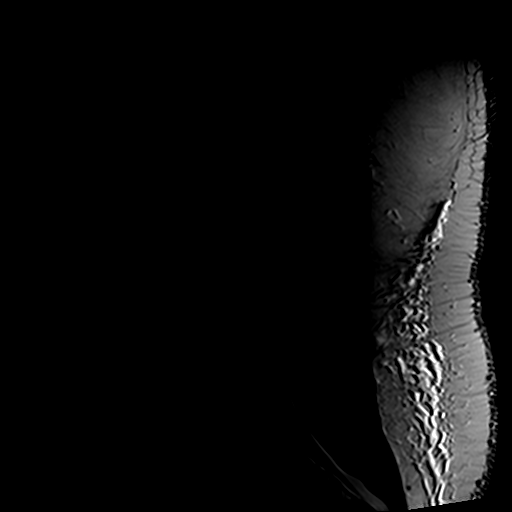
[im 9/12]
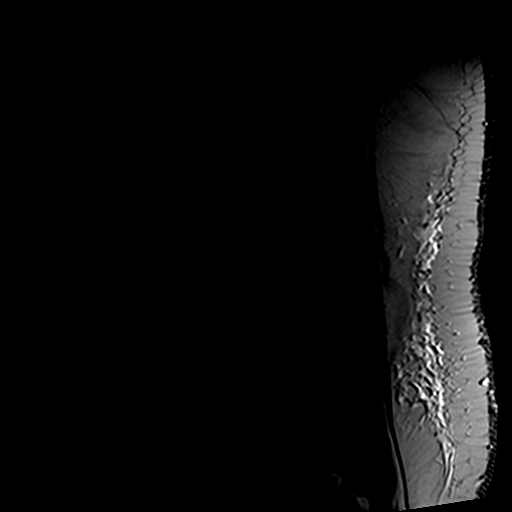
[im 12/12]
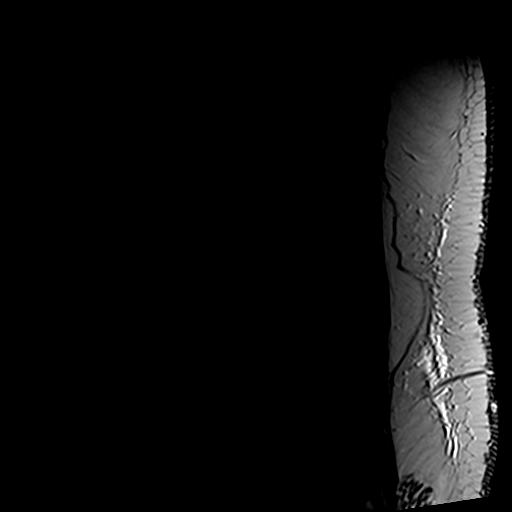

[Series 4: T1 · sagittal · 4.0mm · 0.55mm/px · 5 of 12 slices shown (1 of 2)]
[im 1/12]
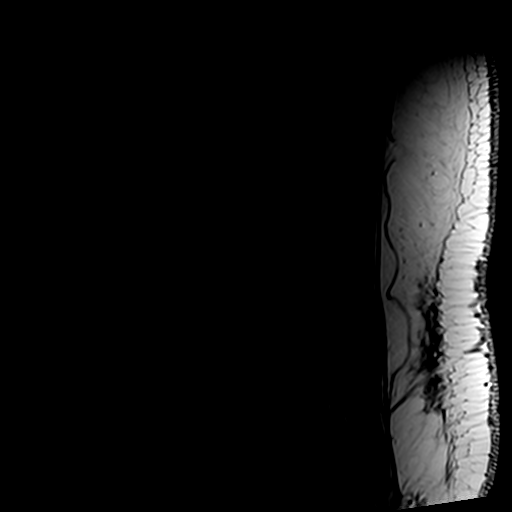
[im 3/12]
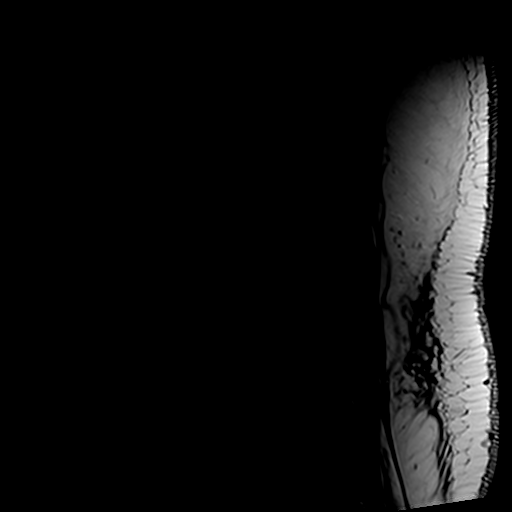
[im 6/12]
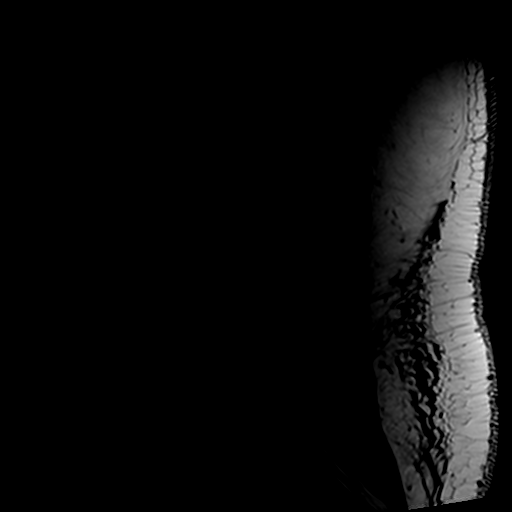
[im 9/12]
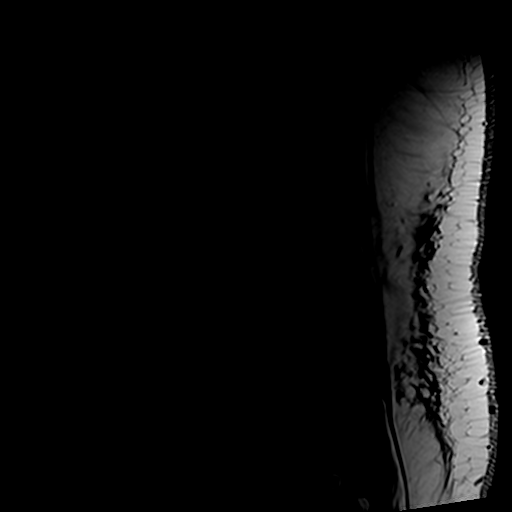
[im 12/12]
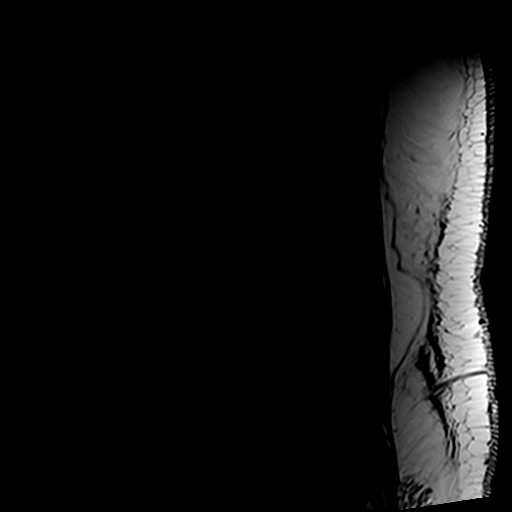

[Series 6: T2 · axial · 4.0mm · 0.86mm/px · z∈[-1,+197]mm · 8 of 33 slices shown (2 of 2)]
[im 1/33]
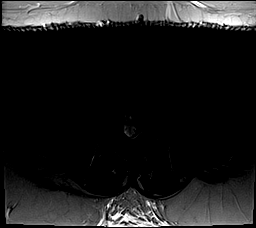
[im 5/33]
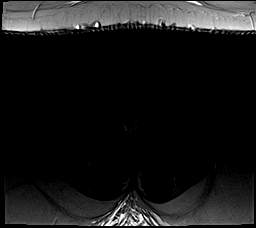
[im 10/33]
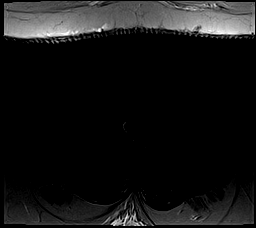
[im 15/33]
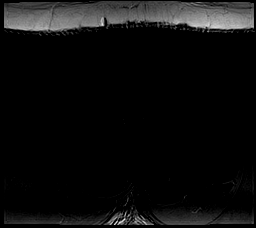
[im 18/33]
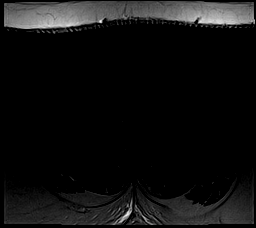
[im 23/33]
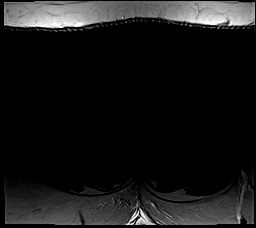
[im 28/33]
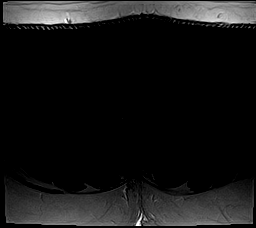
[im 33/33]
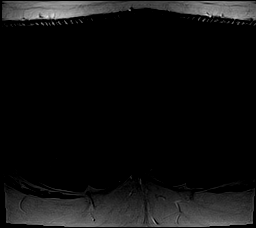

[Series 8: T1 · axial · 4.0mm · 0.43mm/px · z∈[-4,+196]mm · 8 of 33 slices shown (2 of 2)]
[im 1/33]
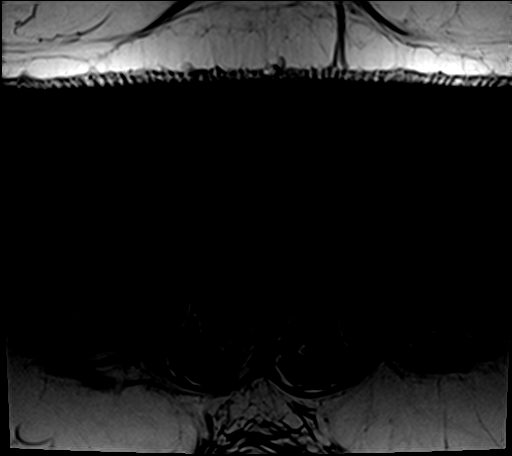
[im 5/33]
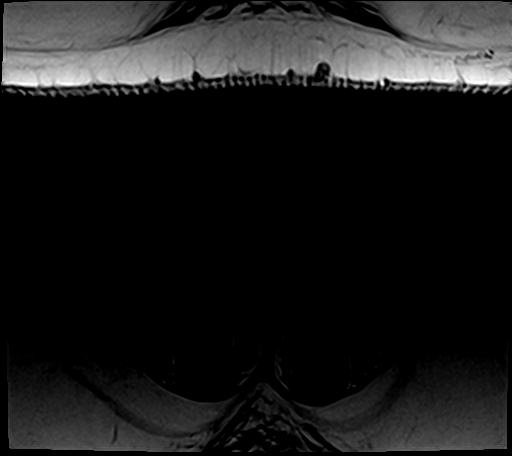
[im 10/33]
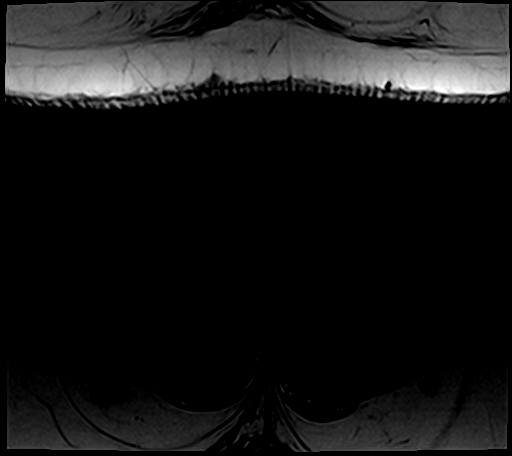
[im 15/33]
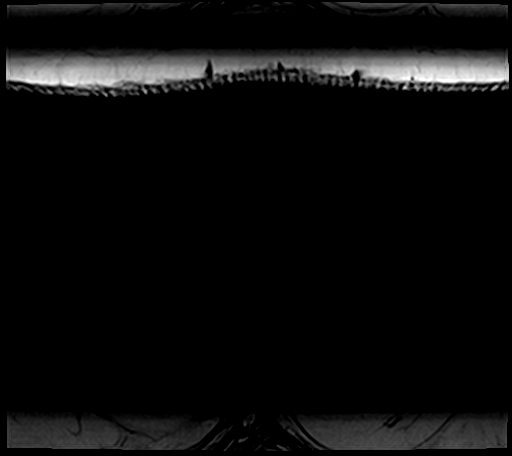
[im 18/33]
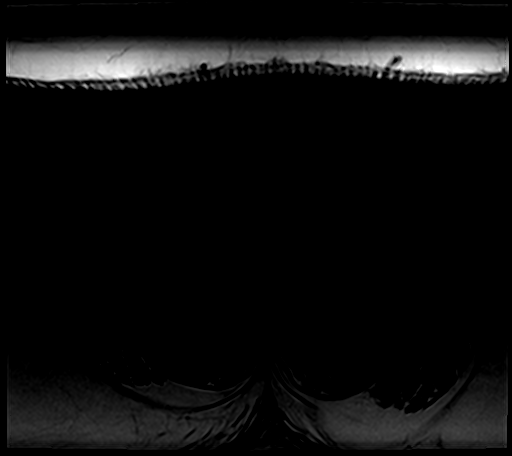
[im 23/33]
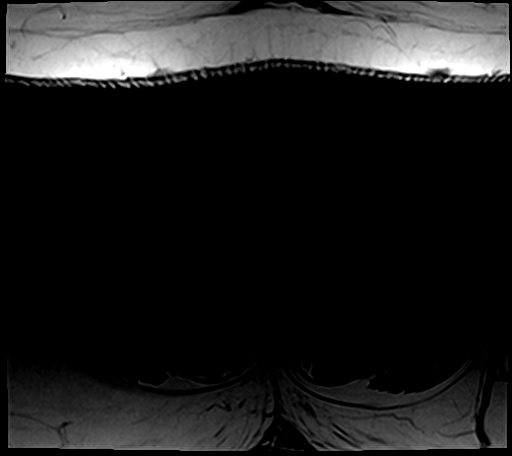
[im 28/33]
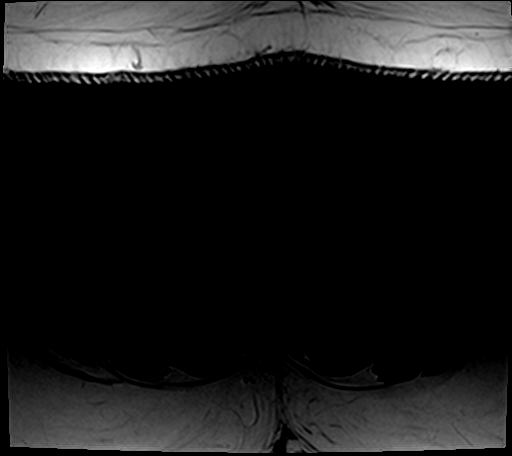
[im 33/33]
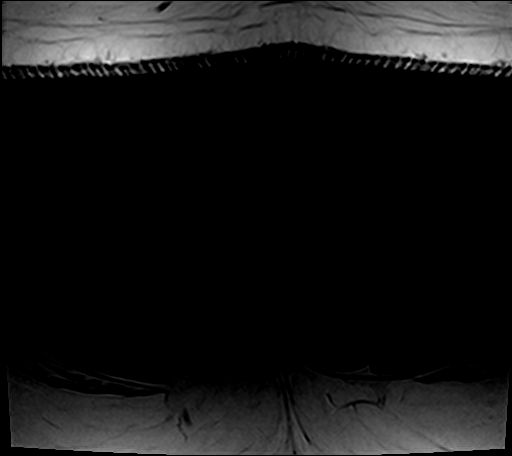

[26 of 48 positions shown; findings below may reference images not displayed]

FINDINGS: There is no abnormality at L4-5 or above.  Intervertebral
discs are normal in signal characteristics morphology.  The spinal
canal and foramina are widely patent.  The distal cord and conus
are normal.

At L5-S1, the disc is degenerated.  There is a left posterolateral
disc herniation that would have potential to compress the left L5
and S1 nerve roots.  No right-sided pathology is evident.  This has
enlarged since the previous study.
IMPRESSION: Enlargement of the left posterolateral disc herniation at L5-S1
that would have potential to compress the left L5 and S1 nerve
roots.  No right-sided compressive lesion is evident.

## 2010-12-08 ENCOUNTER — Emergency Department (HOSPITAL_COMMUNITY)
Admission: EM | Admit: 2010-12-08 | Discharge: 2010-12-09 | Disposition: A | Discharge status: Home / Self Care | Payer: Medicare Other | Attending: Emergency Medicine | Admitting: Emergency Medicine

## 2010-12-08 ENCOUNTER — Emergency Department (HOSPITAL_COMMUNITY): Payer: Medicare Other

## 2010-12-08 DIAGNOSIS — R1033 Periumbilical pain: Secondary | ICD-10-CM | POA: Insufficient documentation

## 2010-12-08 DIAGNOSIS — F319 Bipolar disorder, unspecified: Secondary | ICD-10-CM | POA: Insufficient documentation

## 2010-12-08 DIAGNOSIS — R112 Nausea with vomiting, unspecified: Secondary | ICD-10-CM | POA: Insufficient documentation

## 2010-12-08 DIAGNOSIS — E119 Type 2 diabetes mellitus without complications: Secondary | ICD-10-CM | POA: Insufficient documentation

## 2010-12-08 DIAGNOSIS — I10 Essential (primary) hypertension: Secondary | ICD-10-CM | POA: Insufficient documentation

## 2010-12-08 LAB — URINALYSIS, ROUTINE W REFLEX MICROSCOPIC
Bilirubin Urine: NEGATIVE
Ketones, ur: NEGATIVE mg/dL
Nitrite: NEGATIVE
Protein, ur: NEGATIVE mg/dL
Specific Gravity, Urine: 1.001 — ABNORMAL LOW (ref 1.005–1.030)
Urobilinogen, UA: 0.2 mg/dL (ref 0.0–1.0)

## 2010-12-08 LAB — DIFFERENTIAL
Basophils Absolute: 0 10*3/uL (ref 0.0–0.1)
Lymphocytes Relative: 24 % (ref 12–46)
Monocytes Absolute: 0.9 10*3/uL (ref 0.1–1.0)
Monocytes Relative: 8 % (ref 3–12)
Neutro Abs: 7.9 10*3/uL — ABNORMAL HIGH (ref 1.7–7.7)

## 2010-12-08 LAB — COMPREHENSIVE METABOLIC PANEL
Alkaline Phosphatase: 62 U/L (ref 39–117)
BUN: 9 mg/dL (ref 6–23)
CO2: 26 mEq/L (ref 19–32)
GFR calc Af Amer: >90 mL/min (ref >90)
GFR calc non Af Amer: >90 mL/min (ref >90)
Glucose, Bld: 127 mg/dL — ABNORMAL HIGH (ref 70–99)
Potassium: 3.8 mEq/L (ref 3.5–5.1)
Total Bilirubin: 0.4 mg/dL (ref 0.3–1.2)
Total Protein: 8 g/dL (ref 6.0–8.3)

## 2010-12-08 LAB — LIPASE, BLOOD: Lipase: 21 U/L (ref 11–59)

## 2010-12-08 LAB — CBC
HCT: 49.1 % (ref 39.0–52.0)
Hemoglobin: 17.1 g/dL — ABNORMAL HIGH (ref 13.0–17.0)
MCHC: 34.8 g/dL (ref 30.0–36.0)

## 2010-12-08 MED ORDER — IOHEXOL 300 MG/ML  SOLN
125.0000 mL | Freq: Once | INTRAMUSCULAR | Status: AC | PRN
  Administered 2010-12-08: 125 mL via INTRAVENOUS

## 2010-12-13 ENCOUNTER — Emergency Department (HOSPITAL_COMMUNITY)
Admission: EM | Admit: 2010-12-13 | Discharge: 2010-12-13 | Disposition: A | Discharge status: Home / Self Care | Payer: Medicare Other | Attending: Emergency Medicine | Admitting: Emergency Medicine

## 2010-12-13 ENCOUNTER — Emergency Department (HOSPITAL_COMMUNITY): Payer: Medicare Other

## 2010-12-13 DIAGNOSIS — R0989 Other specified symptoms and signs involving the circulatory and respiratory systems: Secondary | ICD-10-CM | POA: Insufficient documentation

## 2010-12-13 DIAGNOSIS — K921 Melena: Secondary | ICD-10-CM | POA: Insufficient documentation

## 2010-12-13 DIAGNOSIS — F319 Bipolar disorder, unspecified: Secondary | ICD-10-CM | POA: Insufficient documentation

## 2010-12-13 DIAGNOSIS — Z79899 Other long term (current) drug therapy: Secondary | ICD-10-CM | POA: Insufficient documentation

## 2010-12-13 DIAGNOSIS — R110 Nausea: Secondary | ICD-10-CM | POA: Insufficient documentation

## 2010-12-13 DIAGNOSIS — I10 Essential (primary) hypertension: Secondary | ICD-10-CM | POA: Insufficient documentation

## 2010-12-13 DIAGNOSIS — R109 Unspecified abdominal pain: Secondary | ICD-10-CM | POA: Insufficient documentation

## 2010-12-13 DIAGNOSIS — J45909 Unspecified asthma, uncomplicated: Secondary | ICD-10-CM | POA: Insufficient documentation

## 2010-12-13 DIAGNOSIS — R0609 Other forms of dyspnea: Secondary | ICD-10-CM | POA: Insufficient documentation

## 2010-12-13 DIAGNOSIS — R10819 Abdominal tenderness, unspecified site: Secondary | ICD-10-CM | POA: Insufficient documentation

## 2010-12-13 DIAGNOSIS — R0602 Shortness of breath: Secondary | ICD-10-CM | POA: Insufficient documentation

## 2010-12-13 DIAGNOSIS — E119 Type 2 diabetes mellitus without complications: Secondary | ICD-10-CM | POA: Insufficient documentation

## 2010-12-13 DIAGNOSIS — E789 Disorder of lipoprotein metabolism, unspecified: Secondary | ICD-10-CM | POA: Insufficient documentation

## 2010-12-13 LAB — DIFFERENTIAL
Basophils Absolute: 0 10*3/uL (ref 0.0–0.1)
Eosinophils Absolute: 0.5 10*3/uL (ref 0.0–0.7)
Eosinophils Relative: 4 % (ref 0–5)
Monocytes Absolute: 1 10*3/uL (ref 0.1–1.0)

## 2010-12-13 LAB — COMPREHENSIVE METABOLIC PANEL
ALT: 90 U/L — ABNORMAL HIGH (ref 0–53)
Alkaline Phosphatase: 63 U/L (ref 39–117)
BUN: 8 mg/dL (ref 6–23)
Chloride: 103 mEq/L (ref 96–112)
GFR calc Af Amer: >90 mL/min (ref >90)
Glucose, Bld: 111 mg/dL — ABNORMAL HIGH (ref 70–99)
Potassium: 4.4 mEq/L (ref 3.5–5.1)
Sodium: 136 mEq/L (ref 135–145)
Total Bilirubin: 0.3 mg/dL (ref 0.3–1.2)
Total Protein: 7.6 g/dL (ref 6.0–8.3)

## 2010-12-13 LAB — CBC
MCHC: 35.8 g/dL (ref 30.0–36.0)
MCV: 90.4 fL (ref 78.0–100.0)
Platelets: 266 10*3/uL (ref 150–400)
RDW: 13 % (ref 11.5–15.5)
WBC: 12 10*3/uL — ABNORMAL HIGH (ref 4.0–10.5)

## 2010-12-13 LAB — POCT I-STAT, CHEM 8
Calcium, Ion: 1.12 mmol/L (ref 1.12–1.32)
Creatinine, Ser: 0.9 mg/dL (ref 0.50–1.35)
Glucose, Bld: 112 mg/dL — ABNORMAL HIGH (ref 70–99)
HCT: 52 % (ref 39.0–52.0)
Hemoglobin: 17.7 g/dL — ABNORMAL HIGH (ref 13.0–17.0)

## 2010-12-13 LAB — LIPASE, BLOOD: Lipase: 36 U/L (ref 11–59)

## 2010-12-13 MED ORDER — IOHEXOL 300 MG/ML  SOLN
100.0000 mL | Freq: Once | INTRAMUSCULAR | Status: AC | PRN
  Administered 2010-12-13: 100 mL via INTRAVENOUS
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
## 2011-05-19 IMAGING — CR DG CHEST 2V
2 series · 2 of 2 positions shown · non-contrast
Comparison: 01/15/2009 study.

CLINICAL DATA: Chest pain and dizziness.  Pain on left side.
Tobacco smoker.  Hypertension.  COPD.

CHEST - 2 VIEW

[w chest pa]
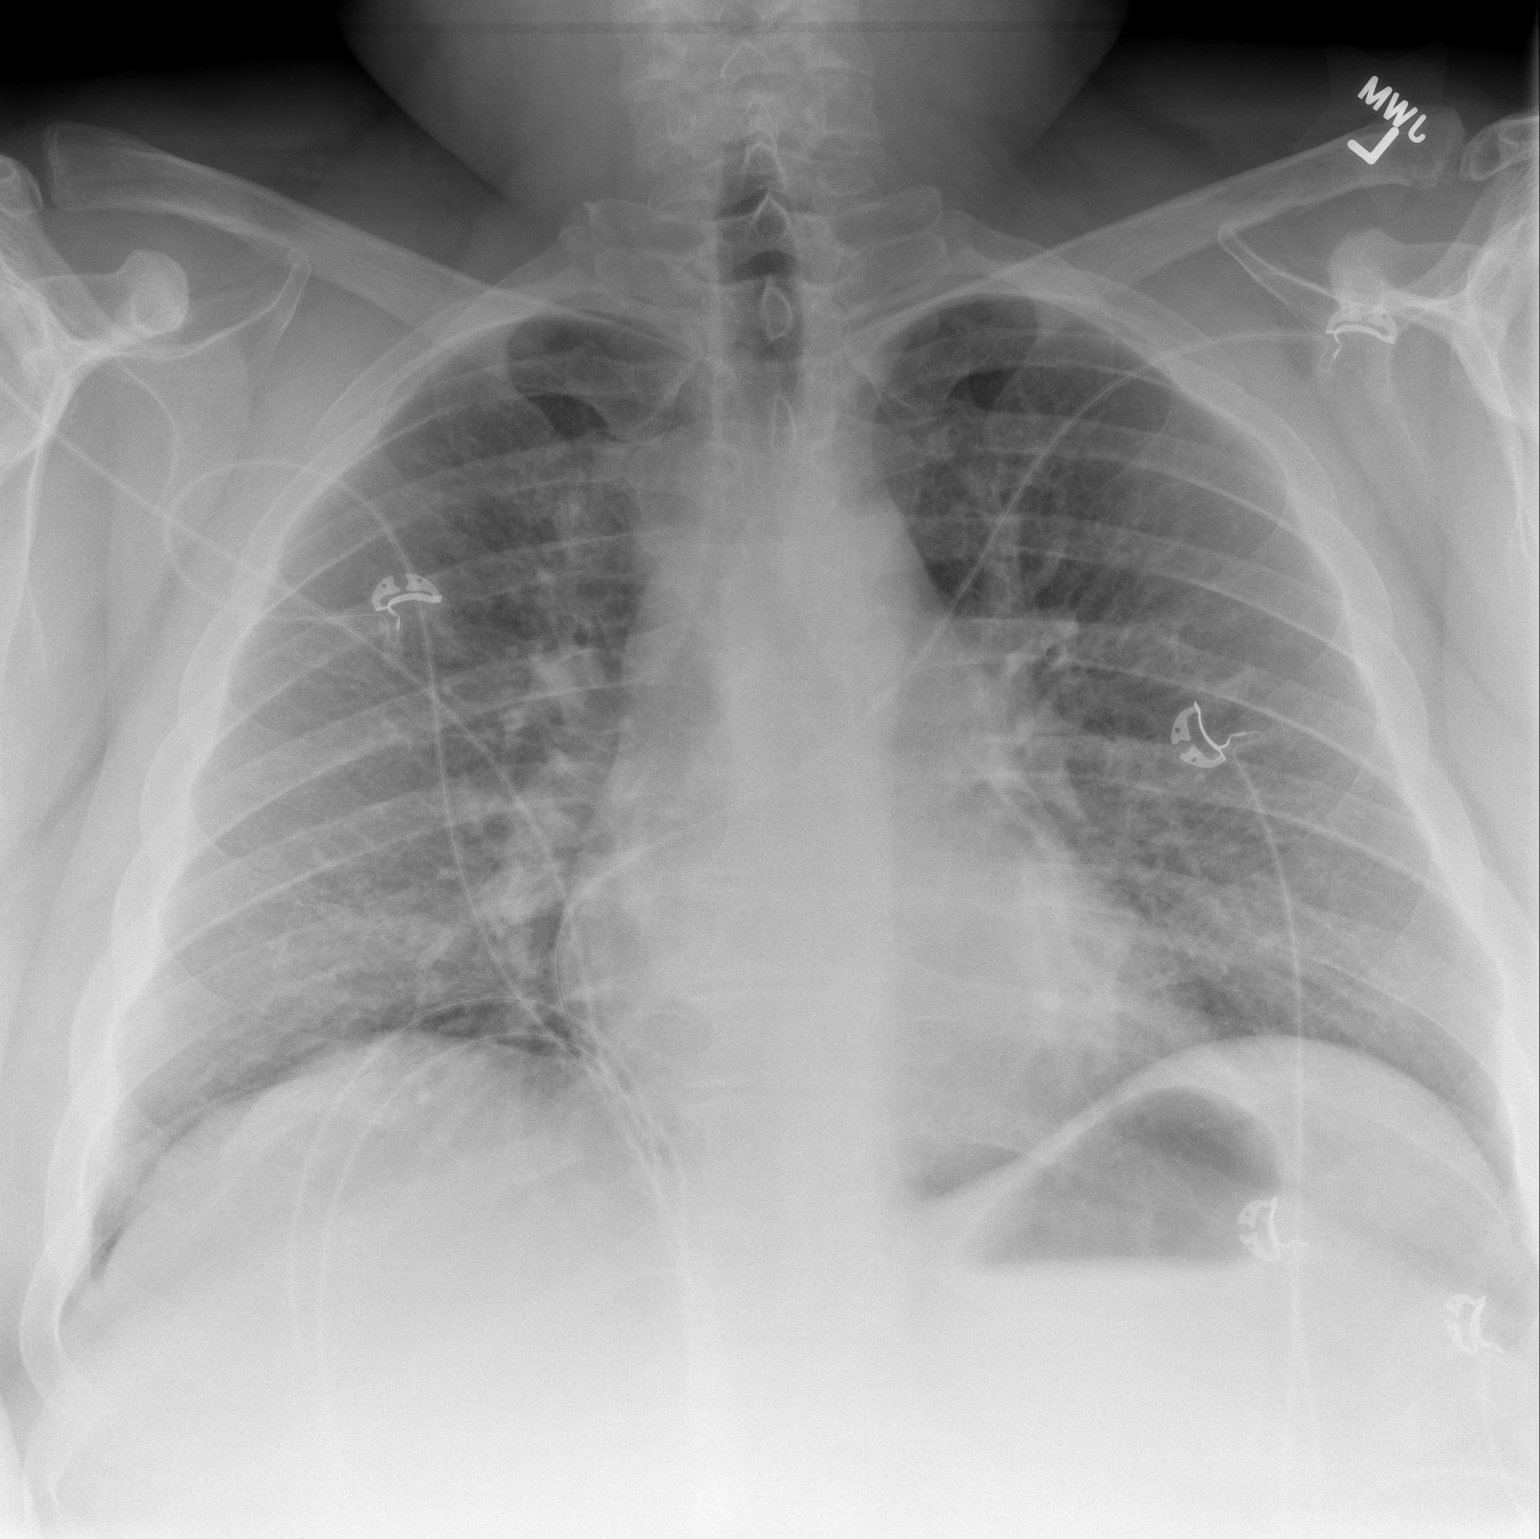

[w chest lat]
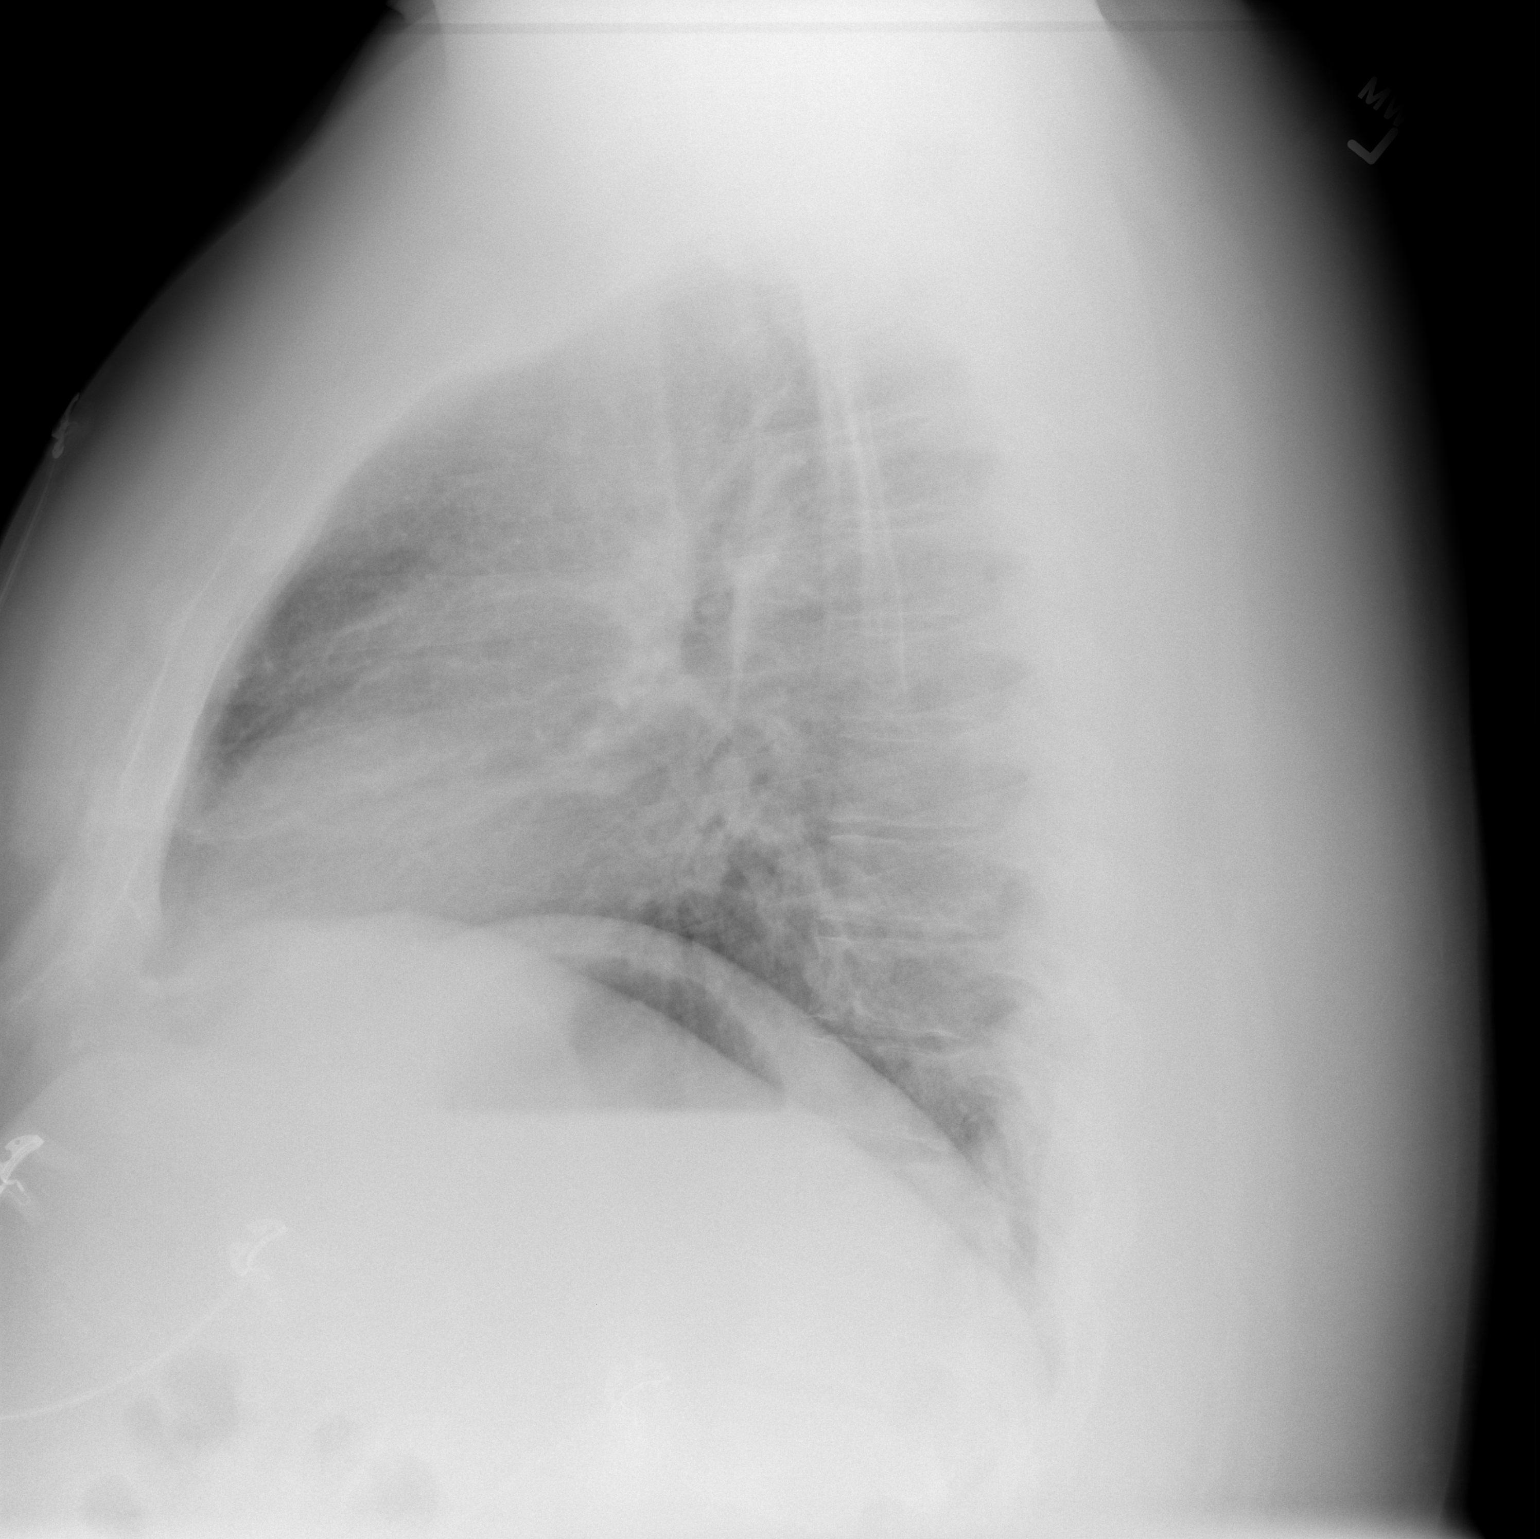

[2 of 2 positions shown; findings below may reference images not displayed]

FINDINGS: The cardiac silhouette is normal size and shape. There is
minimal central peribronchial thickening.  No peripheral pulmonary
infiltrate or consolidation is evident.  No pleural effusion is
seen.  No pneumothorax is evident. Bones appear average for age.
IMPRESSION: There is minimal central peribronchial thickening.  No peripheral
infiltrate or consolidation is seen.  No pneumothorax or other
acute process is evident.

## 2011-05-29 ENCOUNTER — Emergency Department (INDEPENDENT_AMBULATORY_CARE_PROVIDER_SITE_OTHER): Payer: Medicare Other

## 2011-05-29 ENCOUNTER — Other Ambulatory Visit: Payer: Self-pay

## 2011-05-29 ENCOUNTER — Encounter (HOSPITAL_BASED_OUTPATIENT_CLINIC_OR_DEPARTMENT_OTHER): Payer: Self-pay | Admitting: *Deleted

## 2011-05-29 ENCOUNTER — Emergency Department (HOSPITAL_BASED_OUTPATIENT_CLINIC_OR_DEPARTMENT_OTHER)
Admission: EM | Admit: 2011-05-29 | Discharge: 2011-05-29 | Disposition: A | Discharge status: Home / Self Care | Payer: Medicare Other | Attending: Emergency Medicine | Admitting: Emergency Medicine

## 2011-05-29 DIAGNOSIS — R5381 Other malaise: Secondary | ICD-10-CM

## 2011-05-29 DIAGNOSIS — E1140 Type 2 diabetes mellitus with diabetic neuropathy, unspecified: Secondary | ICD-10-CM

## 2011-05-29 DIAGNOSIS — I10 Essential (primary) hypertension: Secondary | ICD-10-CM | POA: Insufficient documentation

## 2011-05-29 DIAGNOSIS — J45909 Unspecified asthma, uncomplicated: Secondary | ICD-10-CM | POA: Insufficient documentation

## 2011-05-29 DIAGNOSIS — E1142 Type 2 diabetes mellitus with diabetic polyneuropathy: Secondary | ICD-10-CM | POA: Insufficient documentation

## 2011-05-29 DIAGNOSIS — R079 Chest pain, unspecified: Secondary | ICD-10-CM

## 2011-05-29 DIAGNOSIS — F419 Anxiety disorder, unspecified: Secondary | ICD-10-CM

## 2011-05-29 DIAGNOSIS — F411 Generalized anxiety disorder: Secondary | ICD-10-CM | POA: Insufficient documentation

## 2011-05-29 DIAGNOSIS — R209 Unspecified disturbances of skin sensation: Secondary | ICD-10-CM

## 2011-05-29 DIAGNOSIS — R071 Chest pain on breathing: Secondary | ICD-10-CM | POA: Insufficient documentation

## 2011-05-29 DIAGNOSIS — E1149 Type 2 diabetes mellitus with other diabetic neurological complication: Secondary | ICD-10-CM | POA: Insufficient documentation

## 2011-05-29 DIAGNOSIS — E669 Obesity, unspecified: Secondary | ICD-10-CM | POA: Insufficient documentation

## 2011-05-29 HISTORY — DX: Endocarditis, valve unspecified: I38

## 2011-05-29 HISTORY — DX: Essential (primary) hypertension: I10

## 2011-05-29 LAB — DIFFERENTIAL
Basophils Absolute: 0 10*3/uL (ref 0.0–0.1)
Basophils Relative: 0 % (ref 0–1)
Eosinophils Absolute: 0.2 10*3/uL (ref 0.0–0.7)
Monocytes Absolute: 1.1 10*3/uL — ABNORMAL HIGH (ref 0.1–1.0)
Monocytes Relative: 9 % (ref 3–12)
Neutrophils Relative %: 71 % (ref 43–77)

## 2011-05-29 LAB — BASIC METABOLIC PANEL
BUN: 13 mg/dL (ref 6–23)
Creatinine, Ser: 0.9 mg/dL (ref 0.50–1.35)
GFR calc Af Amer: >90 mL/min (ref >90)
GFR calc non Af Amer: >90 mL/min (ref >90)

## 2011-05-29 LAB — CBC
HCT: 48.7 % (ref 39.0–52.0)
Hemoglobin: 16.9 g/dL (ref 13.0–17.0)
MCH: 30.7 pg (ref 26.0–34.0)
MCHC: 34.7 g/dL (ref 30.0–36.0)
RDW: 12.8 % (ref 11.5–15.5)

## 2011-05-29 LAB — TROPONIN I: Troponin I: <0.3 ng/mL (ref <0.30)

## 2011-05-29 MED ORDER — MORPHINE SULFATE 4 MG/ML IJ SOLN
4.0000 mg | Freq: Once | INTRAMUSCULAR | Status: AC
  Administered 2011-05-29: 4 mg via INTRAVENOUS
  Filled 2011-05-29: qty 1

## 2011-05-29 MED ORDER — NITROGLYCERIN 0.4 MG SL SUBL
0.4000 mg | SUBLINGUAL_TABLET | SUBLINGUAL | Status: DC | PRN
  Administered 2011-05-29: 0.4 mg via SUBLINGUAL
  Filled 2011-05-29: qty 25

## 2011-05-29 MED ORDER — ASPIRIN 81 MG PO CHEW
324.0000 mg | CHEWABLE_TABLET | Freq: Once | ORAL | Status: AC
  Administered 2011-05-29: 324 mg via ORAL
  Filled 2011-05-29: qty 4

## 2011-05-29 MED ORDER — HYDROMORPHONE HCL PF 1 MG/ML IJ SOLN
1.0000 mg | Freq: Once | INTRAMUSCULAR | Status: AC
  Administered 2011-05-29: 1 mg via INTRAVENOUS
  Filled 2011-05-29: qty 1

## 2011-05-29 MED ORDER — OXYCODONE HCL 5 MG PO CAPS: 5.0000 mg | ORAL_CAPSULE | ORAL | Status: AC | PRN

## 2011-05-29 MED ORDER — ONDANSETRON HCL 4 MG/2ML IJ SOLN
4.0000 mg | Freq: Once | INTRAMUSCULAR | Status: AC
  Administered 2011-05-29: 4 mg via INTRAVENOUS
  Filled 2011-05-29: qty 2

## 2011-05-29 NOTE — ED Provider Notes (Signed)
History     CSN: [id number]  Arrival date & time 05/29/11  1344   First MD Initiated Contact with Patient 05/29/11 1348      Chief Complaint  Patient presents with  . Numbness    (Consider location/radiation/quality/duration/timing/severity/associated sxs/prior treatment) HPI Comments: Pt states that he developed cp in his left chest last night and has constant pain since that time:pt states that he then developed tingling and burning to left side:pt states that he has a history of anxiety, and this has happened before, but never lasted this long:pt states that he has numbness and when asked to clarify he states that he feels week when he walk  Patient is a 36 y.o. male presenting with chest pain. The history is provided by the patient. No language interpreter was used.  Chest Pain Chest pain occurs constantly. The chest pain is unchanged. The severity of the pain is moderate. The quality of the pain is described as aching. The pain does not radiate. Chest pain is worsened by deep breathing. Pertinent negatives for primary symptoms include no fever, no syncope, no shortness of breath, no nausea and no vomiting. He tried nothing for the symptoms. Risk factors include male gender, obesity and sedentary lifestyle.  His past medical history is significant for diabetes and hypertension.  His family medical history is significant for heart disease in family.     Past Medical History  Diagnosis Date  . Heart valve disorder   . Hypertension   . Asthma   . Diabetes mellitus     No past surgical history on file.  No family history on file.  History  Substance Use Topics  . Smoking status: Not on file  . Smokeless tobacco: Not on file  . Alcohol Use:       Review of Systems  Constitutional: Negative for fever.  Respiratory: Negative for shortness of breath.   Cardiovascular: Positive for chest pain. Negative for syncope.  Gastrointestinal: Negative for nausea and vomiting.    All other systems reviewed and are negative.    Allergies  Review of patient's allergies indicates not on file.  Home Medications  No current outpatient prescriptions on file.  BP 135/80  Pulse 97  Temp(Src) 97.7 F (36.5 C) (Oral)  Resp 24  Ht 6\' 2"  (1.88 m)  Wt 430 lb (195.047 kg)  BMI 55.21 kg/m2  SpO2 95%  Physical Exam  Vitals reviewed. Constitutional: He is oriented to person, place, and time. He appears well-developed and well-nourished.  HENT:  Head: Normocephalic and atraumatic.  Eyes: Conjunctivae and EOM are normal.  Neck: Normal range of motion. Neck supple.  Cardiovascular: Normal rate and regular rhythm.   Pulmonary/Chest: Effort normal and breath sounds normal. He exhibits tenderness.       Pt tender on the left chest wall  Abdominal: Soft. Bowel sounds are normal.  Musculoskeletal: Normal range of motion.  Neurological: He is alert and oriented to person, place, and time. He exhibits normal muscle tone. Coordination normal.       Equal strength in arms and legs:no pronator droop:speech is normal:no facial droop noted  Skin: Skin is warm and dry.  Psychiatric: His mood appears anxious.    ED Course  Procedures (including critical care time)  Labs Reviewed  CBC - Abnormal; Notable for the following:    WBC 12.1 (*)    All other components within normal limits  DIFFERENTIAL - Abnormal; Notable for the following:    Neutro Abs 8.6 (*)  Monocytes Absolute 1.1 (*)    All other components within normal limits  BASIC METABOLIC PANEL - Abnormal; Notable for the following:    Glucose, Bld 125 (*)    All other components within normal limits  TROPONIN I   No results found.  Date: 05/29/2011  Rate: 92  Rhythm: normal sinus rhythm  QRS Axis: normal  Intervals: normal  ST/T Wave abnormalities: normal  Conduction Disutrbances:none  Narrative Interpretation:   Old EKG Reviewed: unchanged    1. Diabetic neuropathy   2. Chest pain   3. Anxiety        MDM  Pt is symptom free at this time:pt states that the cp was relieved with the morphine, but the burning sensation on the left side was not relieved until dilaudid:think symptoms likely related to anxiety and diabetes:doubt acs        Teressa Lower, NP 05/29/11 2325

## 2011-05-29 NOTE — Discharge Instructions (Signed)
Anxiety and Panic Attacks  Your caregiver has informed you that you are having an anxiety or panic attack. There may be many forms of this. Most of the time these attacks come suddenly and without warning. They come at any time of day, including periods of sleep, and at any time of life. They may be strong and unexplained. Although panic attacks are very scary, they are physically harmless. Sometimes the cause of your anxiety is not known. Anxiety is a protective mechanism of the body in its fight or flight mechanism. Most of these perceived danger situations are actually nonphysical situations (such as anxiety over losing a job).  CAUSES    The causes of an anxiety or panic attack are many. Panic attacks may occur in otherwise healthy people given a certain set of circumstances. There may be a genetic cause for panic attacks. Some medications may also have anxiety as a side effect.  SYMPTOMS    Some of the most common feelings are:   Intense terror.   Dizziness, feeling faint.   Hot and cold flashes.   Fear of going crazy.   Feelings that nothing is real.   Sweating.   Shaking.   Chest pain or a fast heartbeat (palpitations).   Smothering, choking sensations.   Feelings of impending doom and that death is near.   Tingling of extremities, this may be from over-breathing.   Altered reality (derealization).   Being detached from yourself (depersonalization).  Several symptoms can be present to make up anxiety or panic attacks.  DIAGNOSIS    The evaluation by your caregiver will depend on the type of symptoms you are experiencing. The diagnosis of anxiety or panic attack is made when no physical illness can be determined to be a cause of the symptoms.  TREATMENT    Treatment to prevent anxiety and panic attacks may include:   Avoidance of circumstances that cause anxiety.   Reassurance and relaxation.   Regular exercise.   Relaxation therapies, such as yoga.    Psychotherapy with a psychiatrist or therapist.   Avoidance of caffeine, alcohol and illegal drugs.   Prescribed medication.  SEEK IMMEDIATE MEDICAL CARE IF:     You experience panic attack symptoms that are different than your usual symptoms.   You have any worsening or concerning symptoms.  Document Released: 02/15/2005 Document Revised: 02/04/2011 Document Reviewed: 06/19/2009  [employer] Patient Information 2012 [employer]    Chest Pain (Nonspecific)  It is often hard to give a specific diagnosis for the cause of chest pain. There is always a chance that your pain could be related to something serious, such as a heart attack or a blood clot in the lungs. You need to follow up with your caregiver for further evaluation.  CAUSES     Heartburn.   Pneumonia or bronchitis.   Anxiety or stress.   Inflammation around your heart (pericarditis) or lung (pleuritis or pleurisy).   A blood clot in the lung.   A collapsed lung (pneumothorax). It can develop suddenly on its own (spontaneous pneumothorax) or from injury (trauma) to the chest.   Shingles infection (herpes zoster virus).  The chest wall is composed of bones, muscles, and cartilage. Any of these can be the source of the pain.   The bones can be bruised by injury.   The muscles or cartilage can be strained by coughing or overwork.   The cartilage can be affected by inflammation and become sore (costochondritis).  DIAGNOSIS      Treatment depends on what may be causing your chest pain. Treatment may include:   Acid blockers for heartburn.   Anti-inflammatory medicine.   Pain medicine for inflammatory conditions.   Antibiotics if an infection is present.   You may be advised to change lifestyle habits. This includes stopping smoking and avoiding alcohol, caffeine, and chocolate.   You may be  advised to keep your head raised (elevated) when sleeping. This reduces the chance of acid going backward from your stomach into your esophagus.   Most of the time, nonspecific chest pain will improve within 2 to 3 days with rest and mild pain medicine.  HOME CARE INSTRUCTIONS   If antibiotics were prescribed, take your antibiotics as directed. Finish them even if you start to feel better.   For the next few days, avoid physical activities that bring on chest pain. Continue physical activities as directed.   Do not smoke.   Avoid drinking alcohol.   Only take over-the-counter or prescription medicine for pain, discomfort, or fever as directed by your caregiver.   Follow your caregiver's suggestions for further testing if your chest pain does not go away.   Keep any follow-up appointments you made. If you do not go to an appointment, you could develop lasting (chronic) problems with pain. If there is any problem keeping an appointment, you must call to reschedule.  SEEK MEDICAL CARE IF:   You think you are having problems from the medicine you are taking. Read your medicine instructions carefully.   Your chest pain does not go away, even after treatment.   You develop a rash with blisters on your chest.  SEEK IMMEDIATE MEDICAL CARE IF:   You have increased chest pain or pain that spreads to your arm, neck, jaw, back, or abdomen.   You develop shortness of breath, an increasing cough, or you are coughing up blood.   You have severe back or abdominal pain, feel nauseous, or vomit.   You develop severe weakness, fainting, or chills.   You have a fever.  THIS IS AN EMERGENCY. Do not wait to see if the pain will go away. Get medical help at once. Call your local emergency services (911 in [place] Do not drive yourself to the hospital. MAKE SURE YOU:   Understand these instructions.   Will watch your condition.   Will get help right away if you are not doing well or get worse.    Document Released: 11/25/2004 Document Revised: 02/04/2011 Document Reviewed: 09/21/2007 [facility] Patient Information 2012 [place], [place].Diabetic Neuropathy Diabetic neuropathy is a common complication caused by diabetes. Neuropathy is a term that means nerve disease or damage. If your diabetes is uncontrolled and you have high blood glucose (sugar) levels, over time, this can lead to damage to nerves throughout your body. There are three types of diabetic neuropathy:   Peripheral.   Autonomic.   Focal.  PERIPHERAL NEUROPATHY Peripheral neuropathy is the most common form of diabetic neuropathy. It causes damage to the nerves of the feet and legs and eventually the hands and arms.  SYMPTOMS  Peripheral neuropathy occurs slowly over time. The peripheral nerves sense touch, hot and cold, and pain. When these nerves no longer work:   Your feet become numb.   You can no longer feel pressure or pain in your feet.   You may have burning, stabbing or aching pain.  This can lead to:  Thick calluses over pressure areas.   Pressure sores.   Ulcers. Ulcers can  become infected with germs (bacteria) and can even lead to infection in the bones of the feet.  DIAGNOSIS  The diagnosis of diabetic neuropathy is difficult at best. Sensory function testing can be done with:  Light touch using a monofilament.   Vibration with tuning fork.   Sharp sensation with pin prick  Other tests that can help diagnose neuropathy are:  Nerve Conduction Velocities (NCV). This checks the transmission of electrical current through a nerve.   Electromyography (EMG). This shows how muscles respond to electrical signals transmitted by nearby nerves.   Quantitative sensory testing, which is used to assess how your nerves respond to vibration and changes in temperature.  AUTONOMIC NEUROPATHY The autonomic nervous system controls functions that you do not think about. Examples would be:   Heart beat.    Regulation of body temperature.   Blood pressure.   Urination.   Digestion.   Sweating.   Sexual function.  SYMPTOMS  The symptoms of autonomic neuropathy vary depending on which nerves are affected.   There can be problems with digestion such as:   Feeling sick to your stomach (nausea).   Vomiting.   Bloating.   Constipation.   Diarrhea.   Abdominal pain.   Difficulty with urination may occur because of the inability to sense when your bladder is full. You may have urine leakage (incontinence) or inability to empty your bladder completely (retention).   Palpitations or a feeling of an abnormal heart beat.   Blood pressure drops on arising (orthostatic hypotension). This can happen when you first sit up or stand up. It causes you to feel:   Dizzy.   Weak.   Faint.   Sexual functioning:   In men, inability to attain and maintain an erection.   In women, vaginal dryness and problems with decreased sexual desire and arousal.  DIAGNOSIS  Diagnosis is often based on reported symptoms. Tell your medical caregiver if you experience:   Dizziness.   Constipation.   Diarrhea.   Inappropriate urination or inability to urinate.   Inability to get or maintain an erection.  Tests that may be done include:  An EKG or Holter Monitor. These are tests that can help show problems with the heart rate or heart rhythm.   X-rays can be used to find if there are problems with your ability to properly empty food from your stomach into the small intestine after eating.  FOCAL NEUROPATHY Focal neuropathy affects just one nerve tract and occurs suddenly. However, it usually improves by itself over time. It does not cause long term damage, and treatments are usually needed only until the problem improves. SYMPTOMS  Examples include:   Abnormal eye movements or abnormal alignment of both eyes.   Weakness in the wrist.   Foot drop, which results in inability to lift the  foot properly. This causes abnormal walking or foot movement.  DIAGNOSIS  Diagnosis is made based on your symptoms and what your caregiver finds on your exam. Other tests that may be done include:  Nerve Conduction Velocities (NCV). This checks the transmission of electrical current through a nerve.   Electromyography (EMG). This shows how muscles respond to electrical signals transmitted by nearby nerves.   Quantitative sensory testing, which is used to assess how your nerves respond to vibration and changes in temperature.  TREATMENT Once nerve damage occurs it cannot be reversed. The goal of treatment is to keep the disease from getting worse. If it gets worse, it will affect  more nerve fibers. Controlling your blood (sugar) is the key. You will need to keep your blood glucose and A1c at the target range prescribed by your caregiver. Things that will help control blood glucose levels include:  Blood glucose monitoring.   Meal planning.   Physical activity.   Diabetes medication.  Over time, maintaining lower blood glucose levels helps lessen symptoms. Sometimes, prescription pain medicine is needed. Focal neuropathy can be painful and unpredictable and occurs most often in older adults with diabetes.  SEEK MEDICAL CARE IF:   You develop peripheral nerve symptoms such as burning, numbness, or pain in your feet, legs or hands.   You develop autonomic nerve symptoms such as:   Dizziness.   Abnormal urinary control.   Inability to get an erection.   You develop focal nerve symptoms such as sudden abnormal eye movements or sudden foot drop.  Document Released: 04/26/2001 Document Revised: 02/04/2011 Document Reviewed: 07/26/2008 [facility] Patient Information 2012 [place], [place].

## 2011-05-29 NOTE — ED Notes (Signed)
Pt to ED via EMS w/ c/o persistent numbness/tingling in LUE- EMS reports pt had an episode of hyperventilating last pm, was able to calm himself with the use of MDI, but LUE numbness persisted; similar episode occurred today PTA

## 2011-05-30 NOTE — ED Provider Notes (Signed)
Medical screening examination/treatment/procedure(s) were performed by non-physician practitioner and as supervising physician I was immediately available for consultation/collaboration.   Nat Christen, MD 05/30/11 1013

## 2011-05-31 NOTE — ED Notes (Signed)
[facility] called due to pt telling them that [facility] has pt's meds from previous visit. Pharmacy called and security called to confirm that pt's meds are not in this dept.Marland Kitchen Spoke with Asher Muir at [facility].

## 2011-06-10 IMAGING — CT CT ABD-PELV W/ CM
2 of 4 series · 13 of 32 positions shown, 18 images · IV contrast (READICAT/WATER & OMNI 300/[ID])
Comparison: 09/18/2008

CLINICAL DATA: Abdominal pain, nausea, vomiting, diabetes, obesity

CT ABDOMEN AND PELVIS WITH CONTRAST
TECHNIQUE: Multidetector CT imaging of the abdomen and pelvis was
performed following the standard protocol during bolus
administration of intravenous contrast.
Contrast: 125 ml Pmnipaque-[facility]

[Series 2: abd/pelvis w/ · axial · 0.98mm/px · z∈[-494,-99]mm · 5 of 119 slices shown, 10 images]
[im 20/119  soft-tissue]
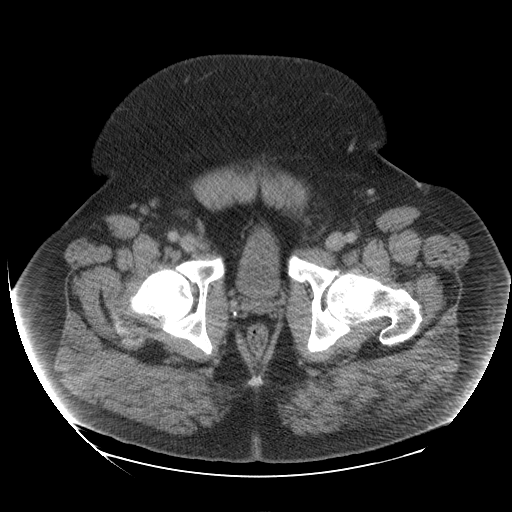
[im 20/119  bone]
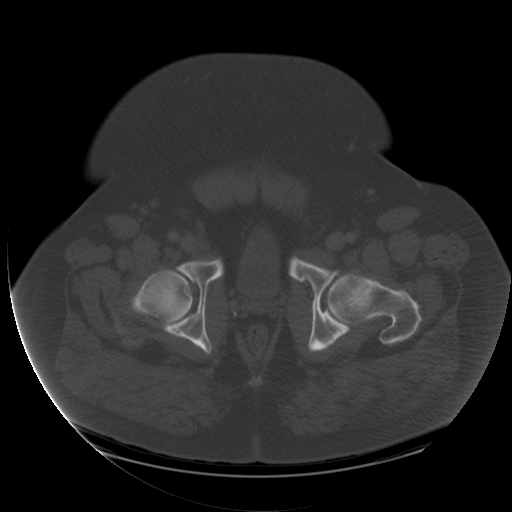
[im 40/119  soft-tissue]
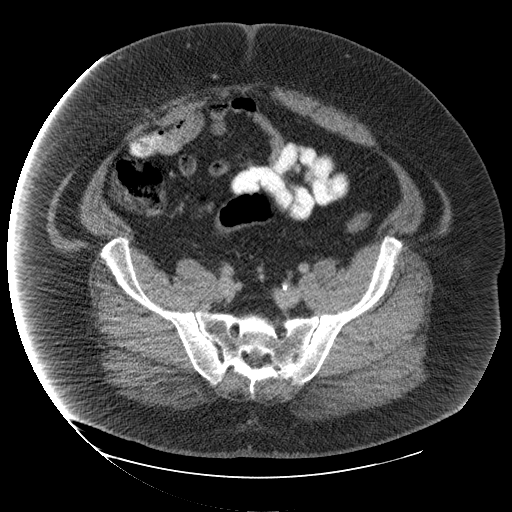
[im 40/119  lung]
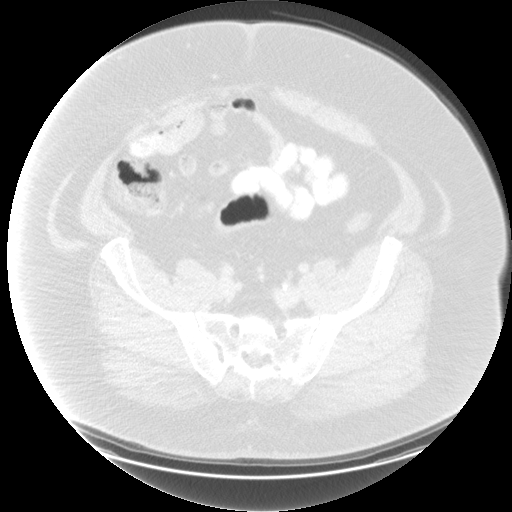
[im 60/119  soft-tissue]
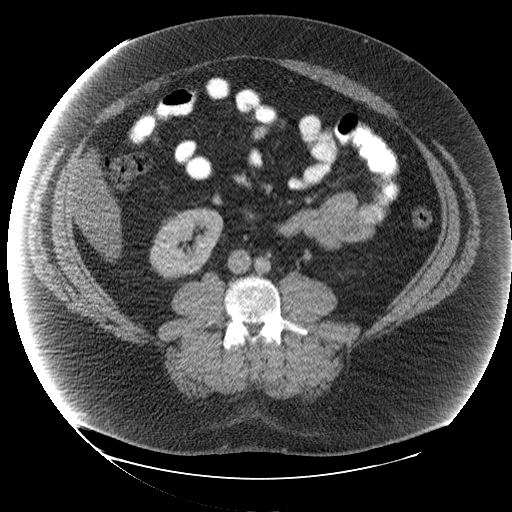
[im 60/119  lung]
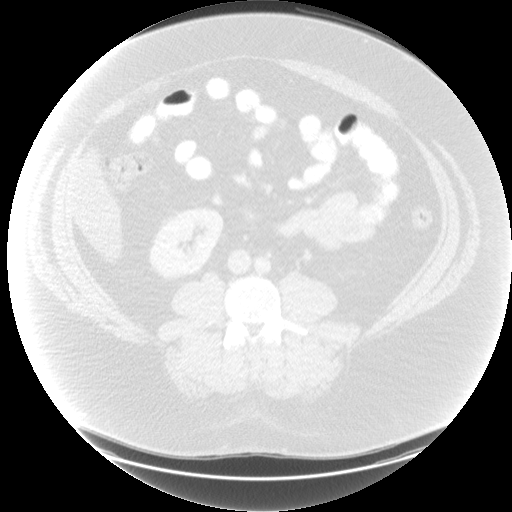
[im 79/119  soft-tissue]
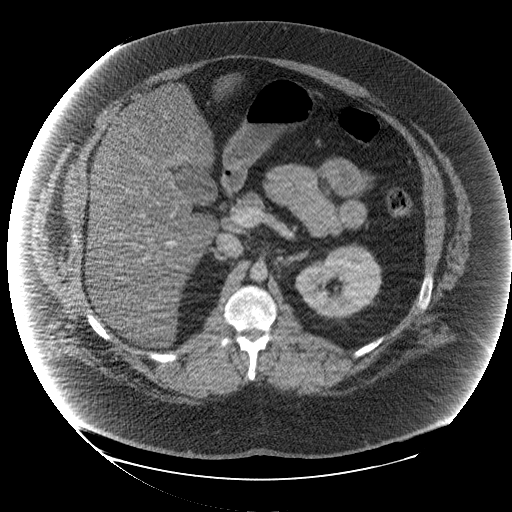
[im 79/119  lung]
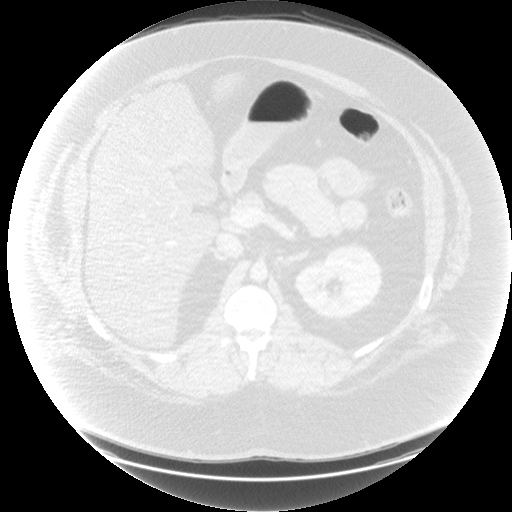
[im 99/119  soft-tissue]
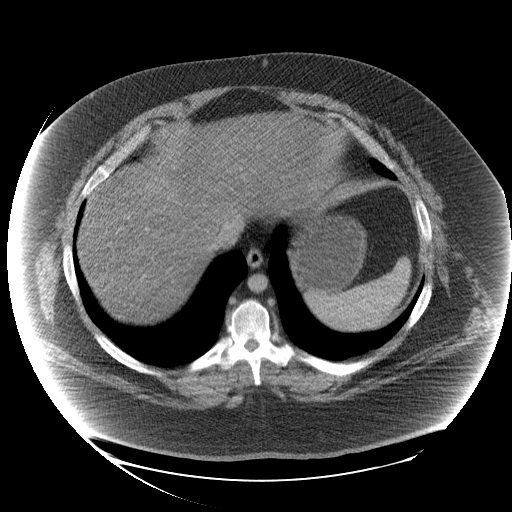
[im 99/119  lung]
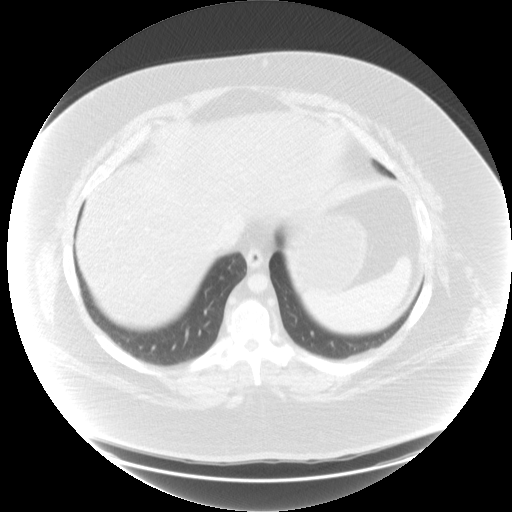

[Series 401: sag · sagittal · 1.21mm/px · 8 of 199 slices shown]
[im 19/199  soft-tissue]
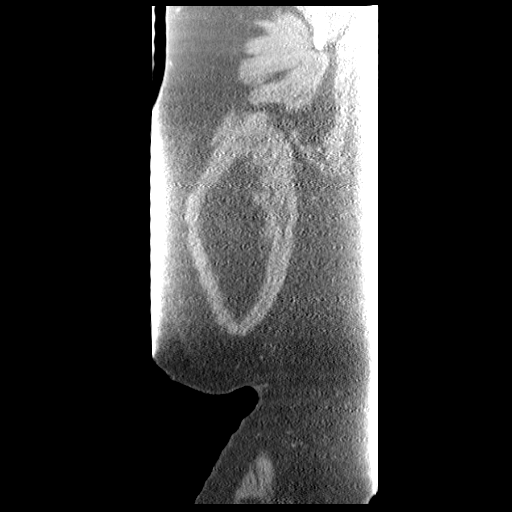
[im 37/199  soft-tissue]
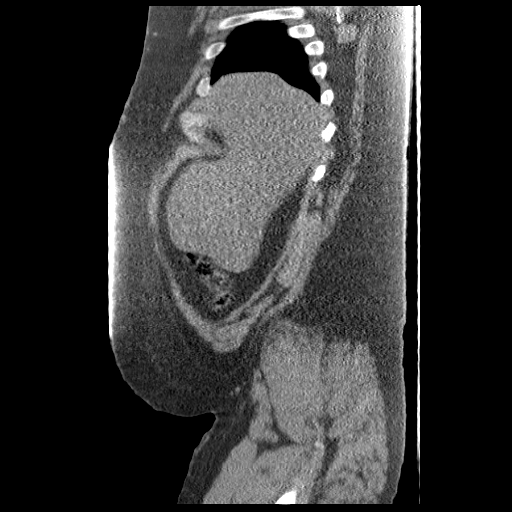
[im 73/199  soft-tissue]
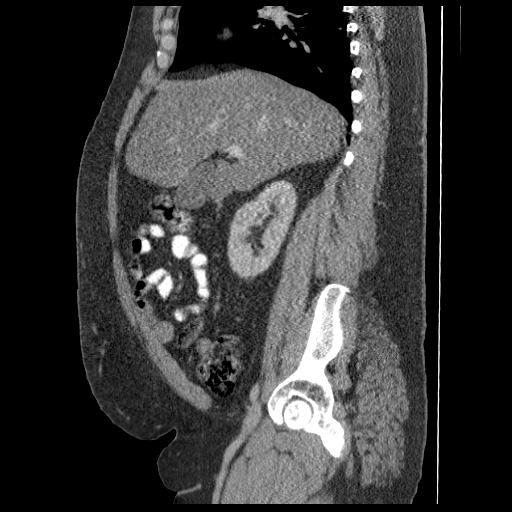
[im 91/199  soft-tissue]
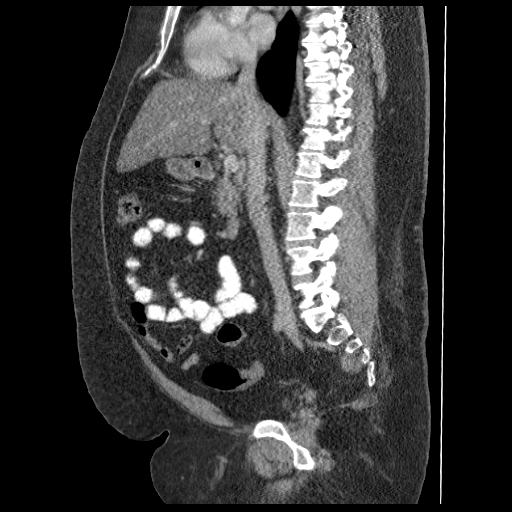
[im 109/199  soft-tissue]
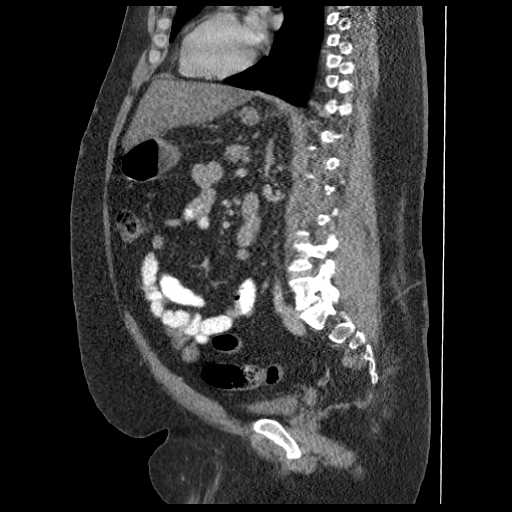
[im 127/199  soft-tissue]
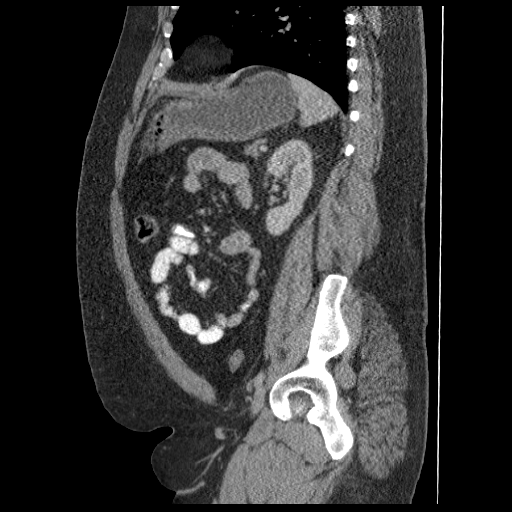
[im 163/199  soft-tissue]
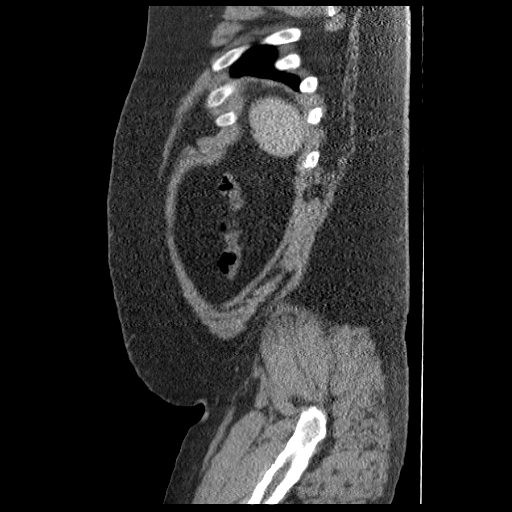
[im 181/199  soft-tissue]
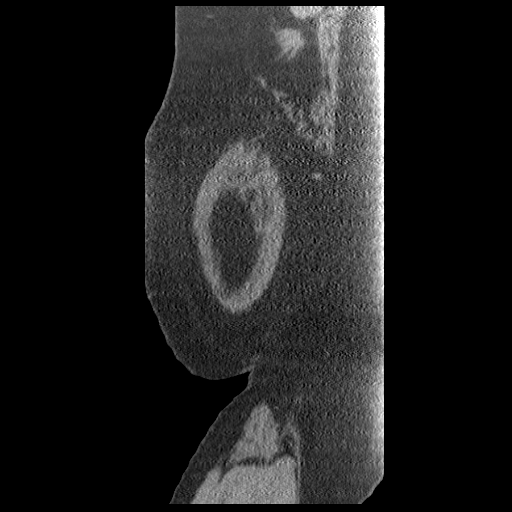

[13 of 32 positions shown; findings below may reference images not displayed]

FINDINGS: Normal heart size.  No pericardial or pleural effusion.
Stable punctate noncalcified nodules in the right lower lobe,
images six and nine.  Lung bases otherwise clear.

Abdomen:  Diffuse hypoattenuation of the liver noted consistent
with fatty infiltration.  Small amount of focal fatty sparing along
the gallbladder fossa noted, image 40.  No biliary dilatation.
Gallbladder, biliary system, pancreas, kidneys, adrenal glands, and
spleen are within normal limits for age and stable in appearance.
No bowel obstruction pattern, dilatation, ileus, or free air.
Terminal ileum and appendix are visualized and normal.

Pelvis:  No pelvic free fluid, fluid collection, hemorrhage,
abscess or adenopathy.  Stable small fat containing left inguinal
hernia.

Slight degenerative changes of the lower thoracic spine.  No
compression fracture.
IMPRESSION: Fatty infiltration of the liver
No acute intra-abdominal or pelvic process
Stable sub centimeter right lower lobe pulmonary nodules.

## 2011-06-19 ENCOUNTER — Emergency Department (HOSPITAL_COMMUNITY)
Admission: EM | Admit: 2011-06-19 | Discharge: 2011-06-19 | Disposition: A | Discharge status: Home / Self Care | Payer: Medicare Other | Attending: Emergency Medicine | Admitting: Emergency Medicine

## 2011-06-19 ENCOUNTER — Emergency Department (HOSPITAL_COMMUNITY): Payer: Medicare Other

## 2011-06-19 ENCOUNTER — Encounter (HOSPITAL_COMMUNITY): Payer: Self-pay | Admitting: Emergency Medicine

## 2011-06-19 DIAGNOSIS — Z79899 Other long term (current) drug therapy: Secondary | ICD-10-CM | POA: Insufficient documentation

## 2011-06-19 DIAGNOSIS — J45909 Unspecified asthma, uncomplicated: Secondary | ICD-10-CM | POA: Insufficient documentation

## 2011-06-19 DIAGNOSIS — R112 Nausea with vomiting, unspecified: Secondary | ICD-10-CM | POA: Insufficient documentation

## 2011-06-19 DIAGNOSIS — R1011 Right upper quadrant pain: Secondary | ICD-10-CM | POA: Insufficient documentation

## 2011-06-19 DIAGNOSIS — E119 Type 2 diabetes mellitus without complications: Secondary | ICD-10-CM | POA: Insufficient documentation

## 2011-06-19 DIAGNOSIS — E785 Hyperlipidemia, unspecified: Secondary | ICD-10-CM | POA: Insufficient documentation

## 2011-06-19 DIAGNOSIS — I10 Essential (primary) hypertension: Secondary | ICD-10-CM | POA: Insufficient documentation

## 2011-06-19 HISTORY — DX: Hyperlipidemia, unspecified: E78.5

## 2011-06-19 HISTORY — DX: Anaphylactic shock, unspecified, initial encounter: T78.2XXA

## 2011-06-19 LAB — COMPREHENSIVE METABOLIC PANEL
ALT: 48 U/L (ref 0–53)
AST: 41 U/L — ABNORMAL HIGH (ref 0–37)
Albumin: 3.2 g/dL — ABNORMAL LOW (ref 3.5–5.2)
Alkaline Phosphatase: 59 U/L (ref 39–117)
BUN: 6 mg/dL (ref 6–23)
CO2: 26 mEq/L (ref 19–32)
Calcium: 9 mg/dL (ref 8.4–10.5)
Chloride: 98 mEq/L (ref 96–112)
Creatinine, Ser: 0.78 mg/dL (ref 0.50–1.35)
GFR calc Af Amer: >90 mL/min (ref >90)
GFR calc non Af Amer: >90 mL/min (ref >90)
Glucose, Bld: 162 mg/dL — ABNORMAL HIGH (ref 70–99)
Potassium: 4.6 mEq/L (ref 3.5–5.1)
Sodium: 133 mEq/L — ABNORMAL LOW (ref 135–145)
Total Bilirubin: 0.4 mg/dL (ref 0.3–1.2)
Total Protein: 7.4 g/dL (ref 6.0–8.3)

## 2011-06-19 LAB — DIFFERENTIAL
Basophils Absolute: 0 10*3/uL (ref 0.0–0.1)
Basophils Relative: 0 % (ref 0–1)
Eosinophils Absolute: 0.2 10*3/uL (ref 0.0–0.7)
Eosinophils Relative: 3 % (ref 0–5)
Lymphocytes Relative: 18 % (ref 12–46)
Lymphs Abs: 1.3 10*3/uL (ref 0.7–4.0)
Monocytes Absolute: 0.6 10*3/uL (ref 0.1–1.0)
Monocytes Relative: 9 % (ref 3–12)
Neutro Abs: 4.9 10*3/uL (ref 1.7–7.7)
Neutrophils Relative %: 70 % (ref 43–77)

## 2011-06-19 LAB — CBC
HCT: 47.3 % (ref 39.0–52.0)
Hemoglobin: 16.2 g/dL (ref 13.0–17.0)
MCH: 31.2 pg (ref 26.0–34.0)
MCHC: 34.2 g/dL (ref 30.0–36.0)
MCV: 91.1 fL (ref 78.0–100.0)
Platelets: 209 10*3/uL (ref 150–400)
RBC: 5.19 MIL/uL (ref 4.22–5.81)
RDW: 12.6 % (ref 11.5–15.5)
WBC: 7 10*3/uL (ref 4.0–10.5)

## 2011-06-19 LAB — GLUCOSE, CAPILLARY: Glucose-Capillary: 131 mg/dL — ABNORMAL HIGH (ref 70–99)

## 2011-06-19 LAB — URINALYSIS, ROUTINE W REFLEX MICROSCOPIC
Bilirubin Urine: NEGATIVE
Glucose, UA: NEGATIVE mg/dL
Hgb urine dipstick: NEGATIVE
Ketones, ur: NEGATIVE mg/dL
Leukocytes, UA: NEGATIVE
Nitrite: NEGATIVE
Protein, ur: NEGATIVE mg/dL
Specific Gravity, Urine: 1.021 (ref 1.005–1.030)
Urobilinogen, UA: 1 mg/dL (ref 0.0–1.0)
pH: 7.5 (ref 5.0–8.0)

## 2011-06-19 LAB — LIPASE, BLOOD: Lipase: 14 U/L (ref 11–59)

## 2011-06-19 MED ORDER — HYDROMORPHONE HCL PF 1 MG/ML IJ SOLN
1.0000 mg | Freq: Once | INTRAMUSCULAR | Status: AC
  Administered 2011-06-19: 1 mg via INTRAVENOUS
  Filled 2011-06-19: qty 1

## 2011-06-19 MED ORDER — SODIUM CHLORIDE 0.9 % IV BOLUS (SEPSIS)
1000.0000 mL | Freq: Once | INTRAVENOUS | Status: AC
  Administered 2011-06-19: 1000 mL via INTRAVENOUS

## 2011-06-19 MED ORDER — MORPHINE SULFATE 2 MG/ML IJ SOLN
INTRAMUSCULAR | Status: AC
Start: 2011-06-19 — End: 2011-06-19
  Administered 2011-06-19: 2 mg via INTRAVENOUS
  Filled 2011-06-19: qty 1

## 2011-06-19 MED ORDER — MORPHINE SULFATE 4 MG/ML IJ SOLN
6.0000 mg | Freq: Once | INTRAMUSCULAR | Status: AC
  Administered 2011-06-19: 4 mg via INTRAVENOUS
  Filled 2011-06-19: qty 1

## 2011-06-19 MED ORDER — OXYCODONE HCL 5 MG PO TABS: 5.0000 mg | ORAL_TABLET | ORAL | Status: AC | PRN

## 2011-06-19 MED ORDER — IOHEXOL 300 MG/ML  SOLN
125.0000 mL | Freq: Once | INTRAMUSCULAR | Status: AC | PRN
  Administered 2011-06-19: 125 mL via INTRAVENOUS

## 2011-06-19 MED ORDER — PROMETHAZINE HCL 25 MG PO TABS: 25.0000 mg | ORAL_TABLET | Freq: Four times a day (QID) | ORAL | Status: DC | PRN

## 2011-06-19 MED ORDER — ONDANSETRON HCL 4 MG/2ML IJ SOLN
4.0000 mg | Freq: Once | INTRAMUSCULAR | Status: AC
  Administered 2011-06-19: 4 mg via INTRAVENOUS
  Filled 2011-06-19: qty 2

## 2011-06-19 NOTE — ED Provider Notes (Signed)
History     CSN: [id number]  Arrival date & time 06/19/11  0542   First MD Initiated Contact with Patient 06/19/11 [phone]      Chief Complaint  Patient presents with  . Abdominal Pain    (Consider location/radiation/quality/duration/timing/severity/associated sxs/prior treatment) HPI Patient emergency department with a four-day history of right upper quadrant pain.  He states that last night around 10 PM it started to worsen.  He, states he has had some nausea and vomiting, but no diarrhea.  The patient denies chest pain, shortness of breath, weakness, headache, visual changes, back pain, or fevers.  Patient, states she has not tried anything for his symptoms other than his regular medicines.  Patient also states that when he eats it seems to make the pain worse. Past Medical History  Diagnosis Date  . Heart valve disorder   . Hypertension   . Asthma   . Diabetes mellitus   . Hyperlipidemia   . Anaphylactic reaction     History reviewed. No pertinent past surgical history.  No family history on file.  History  Substance Use Topics  . Smoking status: Not on file  . Smokeless tobacco: Not on file  . Alcohol Use:       Review of Systems All pertinent positives and negatives reviewed in the history of present illness  Allergies  Atenolol; Other; and Tylenol  Home Medications   Current Outpatient Rx  Name Route Sig Dispense Refill  . AMPHETAMINE-DEXTROAMPHETAMINE 30 MG PO TABS Oral Take 30 mg by mouth 2 (two) times daily.    Marland Kitchen CLONIDINE HCL 0.2 MG PO TABS Oral Take 0.2 mg by mouth daily.    Marland Kitchen DIAZEPAM 5 MG PO TABS Oral Take 5 mg by mouth every 6 (six) hours as needed.    Marland Kitchen LISINOPRIL 10 MG PO TABS Oral Take 20 mg by mouth daily.     . OXYCODONE HCL 30 MG PO TABS Oral Take 30 mg by mouth every 4 (four) hours as needed. Pain    . SITAGLIPTIN-METFORMIN HCL 50-500 MG PO TABS Oral Take 1 tablet by mouth 2 (two) times daily with a meal.      BP 150/91  Pulse 84   Temp(Src) 99 F (37.2 C) (Oral)  Resp 18  SpO2 96%  Physical Exam Physical Examination: General appearance - alert, well appearing, and in no distress, oriented to person, place, and time, overweight and in mild to moderate distress Mental status - alert, oriented to person, place, and time, normal mood, behavior, speech, dress, motor activity, and thought processes Eyes - pupils equal and reactive, extraocular eye movements intact, left eye normal, right eye normal Ears - bilateral TM's and external ear canals normal Mouth - mucous membranes moist, pharynx normal without lesions Chest - clear to auscultation, no wheezes, rales or rhonchi, symmetric air entry, no tachypnea, retractions or cyanosis Heart - normal rate, regular rhythm, normal S1, S2, no murmurs, rubs, clicks or gallops, normal rate and regular rhythm Abdomen - tenderness noted to the RUQ and R side of the abdomen. no rebound tenderness noted bowel sounds hyperactive Hepatomegaly: none Splenomegaly: none no pulsatile masses no hernias noted Skin - normal coloration and turgor, no rashes, no suspicious skin lesions noted  ED Course  Procedures (including critical care time)  Labs Reviewed  COMPREHENSIVE METABOLIC PANEL - Abnormal; Notable for the following:    Sodium 133 (*)    Glucose, Bld 162 (*)    Albumin 3.2 (*)    AST  41 (*) SLIGHT HEMOLYSIS   All other components within normal limits  GLUCOSE, CAPILLARY - Abnormal; Notable for the following:    Glucose-Capillary 131 (*)    All other components within normal limits  CBC  DIFFERENTIAL  LIPASE, BLOOD  URINALYSIS, ROUTINE W REFLEX MICROSCOPIC   US Abdomen Complete  06/19/2011   *RADIOLOGY REPORT*  Clinical Data:  Right upper quadrant pain.  Vomiting, nausea.  COMPLETE ABDOMINAL ULTRASOUND  Comparison:  CT of the abdomen and pelvis 12/13/2010  Findings:  Gallbladder:  No gallstones, gallbladder wall thickening, or pericholecystic fluid. Gallbladder wall is 2 mm  in thickness.  No sonographic Murphy's sign.  Common bile duct:  5 mm.  Liver:  The liver is echo dense.  There is poor delineation of internal hepatic structures and poor visualization of the diaphragm.  Evaluation of the right lobe is limited.  IVC:  Appears normal.  Pancreas:  The pancreas is not seen because of overlying bowel gas.  Spleen:  The spleen has a normal appearance, 10.2 cm in length.  Right Kidney:  Right kidney is 13.4 cm in length and has a normal appearance.  Left Kidney:  The left kidney is 14.4 cm and is difficult to visualize.  Abdominal aorta:  The visualized abdominal aorta is not aneurysmal. The regions of the bifurcation and mid abdominal aorta are not well seen because of overlying bowel gas and patient body habitus.  Additional findings:  Because of patient's body habitus, there is limited resolution.  IMPRESSION:  1.  No evidence for acute cholecystitis. 2.  Fatty liver. 3.  No evidence for pelvicaliectasis  Original Report Authenticated By: Patterson Hammersmith, M.D.   Ct Abdomen Pelvis W Contrast  06/19/2011  *RADIOLOGY REPORT*  Clinical Data: Right upper quadrant abdominal pain for 4 days, nausea and vomiting  CT ABDOMEN AND PELVIS WITH CONTRAST  Technique:  Multidetector CT imaging of the abdomen and pelvis was performed following the standard protocol during bolus administration of intravenous contrast.  Contrast: OMNIPAQUE IOHEXOL 300 MG/ML  SOLN 125 ml Omnipaque- 300  Comparison: Abdominal ultrasound - earlier same day; CT abdomen pelvis - 12/13/2010  Findings:  Normal hepatic contour.  There is overall decreased attenuation of the hepatic parenchyma suggestive of hepatic steatosis.  No discrete hepatic lesions.  Normal gallbladder.  No intrahepatic biliary dilatation.  No ascites.  There is symmetric enhancement of the bilateral kidneys.  No urinary obstruction.  Grossly symmetric minimal perinephric stranding possibly secondary to patient body habitus.  Normal appearance  of the bilateral adrenal glands, pancreas and spleen.  Only a minimal amount of enteric contrast has been ingested however extends to the level of the proximal transverse colon.  Scattered colonic diverticulosis without evidence of diverticulitis.  The sigmoid colon is noted to be redundant.  The bowel is otherwise normal in course and caliber without wall thickening or evidence of obstruction.  Normal appearance of a diminutive retrocecal appendix (axial images 62 through 68).  No pneumoperitoneum, pneumatosis or portal venous gas.  Normal caliber of the abdominal aorta.  No retroperitoneal, mesenteric, pelvic or inguinal lymphadenopathy.  Pelvic organs are normal.  No free fluid within the pelvis.  Limited visualization of the lower thorax demonstrates minimal bibasilar dependent atelectasis.  No focal airspace opacities or pleural effusion.  Normal heart size.  No pericardial effusion. Incidental note is made of several shotty subcentimeter pericardial lymph nodes.  No acute or aggressive osseous abnormalities.  Minimal lumbar spine degenerative change.  IMPRESSION: 1.  No acute findings within abdomen or pelvis.  Specifically, no evidence of cholecystitis or urinary obstruction. 2.  Overall decreased attenuation to hepatic parenchyma suggestive of hepatic steatosis.  3.  Minimal colonic diverticulosis without evidence of diverticulitis.  Normal appendix.  Original Report Authenticated By: Waynard Reeds, M.D.    Patient has been observed over many hours here in the emergency department.  He has a negative ultrasound and CT scan.  The patient will be advised to return here for any worsening in his condition.  He will be given followup with GI for further evaluation.  The patient is advanced that evening to follow a bland diet for the next 24 hours.  He has not had any vomiting here in the emergency department.  The patient has been able to tolerate oral fluids.    MDM  MDM Reviewed: previous chart,  nursing note and vitals Reviewed previous: labs Interpretation: labs, CT scan and ultrasound           Carlyle Dolly, PA-C 06/19/11 1355

## 2011-06-19 NOTE — Discharge Instructions (Signed)
Return here as needed. All of your testing and CT scan and Ultrasound were normal. Follow up with the GI doctor provided. You will need to follow a bland diet for the next 24-48 hours. Slowly increase your fluids.

## 2011-06-19 NOTE — ED Notes (Signed)
Pt BIB EMS. Pt c/o RUQ for 4 days. Worse over the last 8 hrs. Pt has had n/v. Denies diarrhea.

## 2011-06-19 NOTE — ED Notes (Signed)
PA Lawyer at bedside.  

## 2011-06-19 NOTE — ED Notes (Signed)
[id number]<AV> Expected date:06/19/11<BR> Expected time: 5:37 AM<BR> Means of arrival:Ambulance<BR> Comments:<BR> ABD Pain

## 2011-06-21 NOTE — ED Provider Notes (Signed)
Medical screening examination/treatment/procedure(s) were performed by non-physician practitioner and as supervising physician I was immediately available for consultation/collaboration.    Nelia Shi, MD 06/21/11 1105

## 2011-06-27 ENCOUNTER — Emergency Department (HOSPITAL_COMMUNITY)
Admission: EM | Admit: 2011-06-27 | Discharge: 2011-06-28 | Disposition: A | Discharge status: Home / Self Care | Payer: Medicare Other | Attending: Emergency Medicine | Admitting: Emergency Medicine

## 2011-06-27 ENCOUNTER — Encounter (HOSPITAL_COMMUNITY): Payer: Self-pay | Admitting: Emergency Medicine

## 2011-06-27 DIAGNOSIS — M549 Dorsalgia, unspecified: Secondary | ICD-10-CM

## 2011-06-27 DIAGNOSIS — E785 Hyperlipidemia, unspecified: Secondary | ICD-10-CM | POA: Insufficient documentation

## 2011-06-27 DIAGNOSIS — F909 Attention-deficit hyperactivity disorder, unspecified type: Secondary | ICD-10-CM | POA: Insufficient documentation

## 2011-06-27 DIAGNOSIS — M545 Low back pain, unspecified: Secondary | ICD-10-CM | POA: Insufficient documentation

## 2011-06-27 DIAGNOSIS — E119 Type 2 diabetes mellitus without complications: Secondary | ICD-10-CM | POA: Insufficient documentation

## 2011-06-27 DIAGNOSIS — G8929 Other chronic pain: Secondary | ICD-10-CM | POA: Insufficient documentation

## 2011-06-27 DIAGNOSIS — J45909 Unspecified asthma, uncomplicated: Secondary | ICD-10-CM | POA: Insufficient documentation

## 2011-06-27 DIAGNOSIS — I10 Essential (primary) hypertension: Secondary | ICD-10-CM | POA: Insufficient documentation

## 2011-06-27 DIAGNOSIS — F319 Bipolar disorder, unspecified: Secondary | ICD-10-CM | POA: Insufficient documentation

## 2011-06-27 HISTORY — DX: Attention-deficit hyperactivity disorder, unspecified type: F90.9

## 2011-06-27 HISTORY — DX: Bipolar disorder, unspecified: F31.9

## 2011-06-27 LAB — CBC
Hemoglobin: 16.8 g/dL (ref 13.0–17.0)
MCH: 30.9 pg (ref 26.0–34.0)
MCHC: 34.1 g/dL (ref 30.0–36.0)
Platelets: 358 10*3/uL (ref 150–400)
RDW: 12.6 % (ref 11.5–15.5)

## 2011-06-27 LAB — ETHANOL: Alcohol, Ethyl (B): <11 mg/dL (ref 0–11)

## 2011-06-27 LAB — COMPREHENSIVE METABOLIC PANEL
Albumin: 3.8 g/dL (ref 3.5–5.2)
Alkaline Phosphatase: 70 U/L (ref 39–117)
BUN: 6 mg/dL (ref 6–23)
Potassium: 4.2 mEq/L (ref 3.5–5.1)
Total Protein: 8.2 g/dL (ref 6.0–8.3)

## 2011-06-27 LAB — RAPID URINE DRUG SCREEN, HOSP PERFORMED
Amphetamines: NOT DETECTED
Benzodiazepines: POSITIVE — AB
Opiates: POSITIVE — AB

## 2011-06-27 LAB — DIFFERENTIAL
Basophils Relative: 0 % (ref 0–1)
Eosinophils Absolute: 0.2 10*3/uL (ref 0.0–0.7)
Monocytes Relative: 7 % (ref 3–12)
Neutrophils Relative %: 66 % (ref 43–77)

## 2011-06-27 MED ORDER — FLUTICASONE-SALMETEROL 250-50 MCG/DOSE IN AEPB
1.0000 | INHALATION_SPRAY | Freq: Two times a day (BID) | RESPIRATORY_TRACT | Status: DC
  Filled 2011-06-27: qty 14

## 2011-06-27 MED ORDER — OXYCODONE HCL 5 MG PO TABS
15.0000 mg | ORAL_TABLET | ORAL | Status: DC | PRN
  Administered 2011-06-27 – 2011-06-28 (×2): 15 mg via ORAL
  Filled 2011-06-27: qty 1
  Filled 2011-06-27: qty 3

## 2011-06-27 MED ORDER — AMPHETAMINE-DEXTROAMPHETAMINE 20 MG PO TABS: 30.0000 mg | ORAL_TABLET | Freq: Every day | ORAL | Status: DC

## 2011-06-27 MED ORDER — IBUPROFEN 200 MG PO TABS: 600.0000 mg | ORAL_TABLET | Freq: Three times a day (TID) | ORAL | Status: DC | PRN

## 2011-06-27 MED ORDER — METFORMIN HCL 500 MG PO TABS
500.0000 mg | ORAL_TABLET | Freq: Two times a day (BID) | ORAL | Status: DC
  Filled 2011-06-27 (×3): qty 1

## 2011-06-27 MED ORDER — FOLIC ACID 1 MG PO TABS: 1.0000 mg | ORAL_TABLET | Freq: Every day | ORAL | Status: DC

## 2011-06-27 MED ORDER — DIAZEPAM 5 MG PO TABS
5.0000 mg | ORAL_TABLET | Freq: Three times a day (TID) | ORAL | Status: DC | PRN
Start: 2011-06-27 — End: 2011-06-28
  Administered 2011-06-28: 5 mg via ORAL
  Filled 2011-06-27: qty 1

## 2011-06-27 MED ORDER — LORAZEPAM 1 MG PO TABS
1.0000 mg | ORAL_TABLET | Freq: Three times a day (TID) | ORAL | Status: DC | PRN
  Administered 2011-06-27: 1 mg via ORAL
  Filled 2011-06-27: qty 1

## 2011-06-27 MED ORDER — CLONIDINE HCL 0.1 MG PO TABS: 0.2000 mg | ORAL_TABLET | Freq: Every day | ORAL | Status: DC

## 2011-06-27 MED ORDER — SITAGLIPTIN PHOS-METFORMIN HCL 50-500 MG PO TABS: 1.0000 | ORAL_TABLET | Freq: Two times a day (BID) | ORAL | Status: DC

## 2011-06-27 MED ORDER — ONDANSETRON HCL 4 MG PO TABS: 4.0000 mg | ORAL_TABLET | Freq: Three times a day (TID) | ORAL | Status: DC | PRN

## 2011-06-27 MED ORDER — LISINOPRIL 20 MG PO TABS
20.0000 mg | ORAL_TABLET | Freq: Every day | ORAL | Status: DC
  Filled 2011-06-27: qty 1

## 2011-06-27 MED ORDER — ZOLPIDEM TARTRATE 5 MG PO TABS: 10.0000 mg | ORAL_TABLET | Freq: Every evening | ORAL | Status: DC | PRN

## 2011-06-27 MED ORDER — LINAGLIPTIN 5 MG PO TABS
5.0000 mg | ORAL_TABLET | Freq: Every day | ORAL | Status: DC
  Filled 2011-06-27 (×2): qty 1

## 2011-06-27 MED ORDER — ALUM & MAG HYDROXIDE-SIMETH 200-200-20 MG/5ML PO SUSP: 30.0000 mL | ORAL | Status: DC | PRN

## 2011-06-27 MED ORDER — NICOTINE 21 MG/24HR TD PT24: 21.0000 mg | MEDICATED_PATCH | Freq: Every day | TRANSDERMAL | Status: DC

## 2011-06-27 MED ORDER — TESTOSTERONE 50 MG/5GM (1%) TD GEL: 5.0000 g | Freq: Every day | TRANSDERMAL | Status: DC
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
## 2011-06-27 NOTE — ED Notes (Signed)
Unable to move pt to Psych ED related to girlfriend currently in the Psych ED. Pt given pain medication states "I want 30 mg." Pt given emotional support, and discuss plan of care.

## 2011-06-28 ENCOUNTER — Encounter (HOSPITAL_COMMUNITY): Payer: Self-pay | Admitting: Emergency Medicine

## 2011-06-28 NOTE — ED Provider Notes (Signed)
Patient has been seen by telepsych. Patient has been deemed safe for discharge. Of note, the tele- psychiatrist has documented overtones of drug-seeking and malingering behavior  Olivia Mackie, MD 06/28/11 [phone]

## 2011-06-28 NOTE — ED Notes (Signed)
Telepsych in progress. 

## 2011-06-28 NOTE — Discharge Instructions (Signed)
Please followup with your doctor and therapist for further treatment and care. Return to the emergency department for new or concerning symptoms   RESOURCE GUIDE  Dental Problems  Patients with Medicaid: [facility] [phone] [address]                                                                   [phone] [address] Phone:  [phone]                                                                             Phone:  [phone]  If unable to pay or uninsured, contact:  [facility] or [facility]. to become qualified for the adult dental clinic.  Chronic Pain Problems Contact [facility]  [phone] Patients need to be referred by their primary care doctor.  Insufficient Money for Medicine Contact [employer] Way:  call "211" or [facility] [phone].  No Primary Care Doctor Call [facility]  [phone] Other agencies that provide inexpensive medical care    [facility]  [phone]    [facility]  [phone]    [facility]  [phone]    [facility]  [phone]    [facility]  [phone]    [facility]  [phone]  Psychological Services [facility]  [phone] [facility]  [phone] [facility]   [phone] (emergency services [phone])  Abuse/Neglect [facility] Child Abuse Hotline [phone] [facility] Child Abuse Hotline [phone] (After Hours)  Emergency Shelter [facility] [phone]  Maternity Homes Room at the [facility] [phone] [facility] [phone]  MRSA Hotline #:   510-430-5593  [facility] Resources  [facility] [place]     [employer]                          [facility] Dept. [address] [place]                        [address]          [address]  Patrecia Pace  Michell Heinrich Phone:  [phone]                                     Phone:  [phone]                   Phone:  [phone]  [facility] Phone:  [phone]  [facility] Child Abuse Hotline (302)759-3345 210-207-8368 (After Hours)       *free text    If you develop symptoms of Shortness of Breath, Chest Pain, Swelling of lips, mouth or tongue or if your condition becomes worse with any new symptoms, see your doctor or return to the Emergency Department for immediate care. Emergency services are not intended to be a substitute for comprehensive medical attention.  Please contact your doctor for follow up if not improving as expected.   Call your doctor in 5-7 days or as directed if there is no improvement.   Community Resources: *IF YOU ARE IN IMMEDIATE DANGER CALL 911!  Abuse/Neglect:  Family Services Crisis Hotline [facility]): (201) 115-2329 Center Against Violence [facility]): 6300438533  After hours, holidays and weekends: 843-182-1846 National Domestic Violence Hotline: (973)162-0985  Mental Health: [facility] Mental Health: Drucie Ip: [phone]  Health Clinics:  [facility]gent [facility] Center [facility] D[facility]): [phone] Monday - Friday 8 AM - 9 PM, Saturday and Sunday 10 AM - 9 PM  [facility] [place]: [phone] Monday - Friday 8 AM - 5 PM  [facility]  [address]: [phone] Monday- Friday 8:30 AM - 5:30 PM, Sat 9 AM - 1 PM  [facility] [address]: [phone] [facility]: [phone] [facility] [address]: [phone]  24 [facility]: [address] [phone]
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
## 2011-06-28 NOTE — ED Notes (Signed)
Pt alertx3 resp easy non labored. Pt informed of plan of care. Pt states decrease in pain. Pt  Appears calm and has been more corporative.

## 2011-07-11 IMAGING — CT CT ANGIO CHEST
2 of 6 series · 18 of 36 positions shown · IV contrast (APPLIED)
Comparison: Chest CT 07/02/2005.

CLINICAL DATA: Shortness of breath and leg swelling.  Elevated D-
dimer levels.  Question pulmonary embolism.

CT ANGIOGRAPHY CHEST WITH CONTRAST
TECHNIQUE: Multidetector CT imaging of the chest was performed
using the standard protocol during bolus administration of
intravenous contrast.  Multiplanar CT image reconstructions
including MIPs were obtained to evaluate the vascular anatomy.
Contrast:  80 ml Wmnipaque-GZQ intravenously.

[Series 6: pe 1.0 b25f · axial · 0.86mm/px · z∈[-327,-85]mm · 17 of 270 slices shown]
[im 14/270  lung]
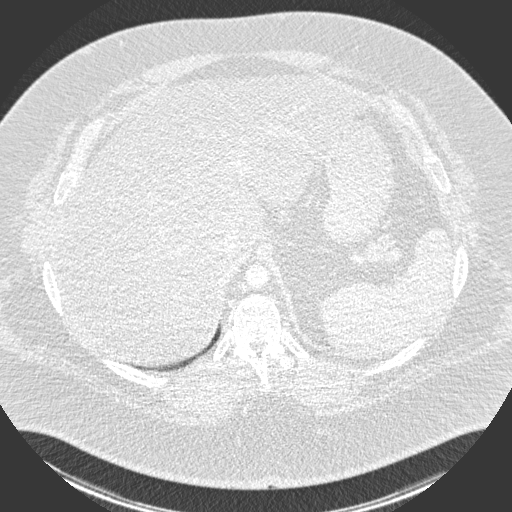
[im 27/270  mediastinal]
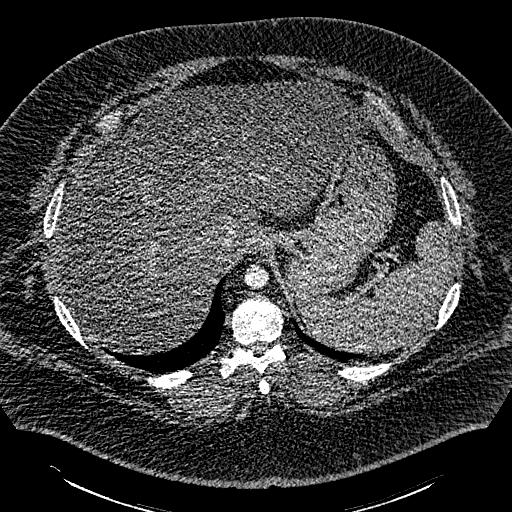
[im 41/270  lung]
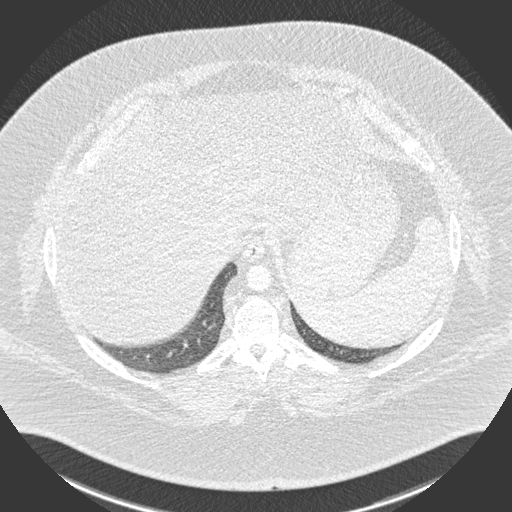
[im 54/270  mediastinal]
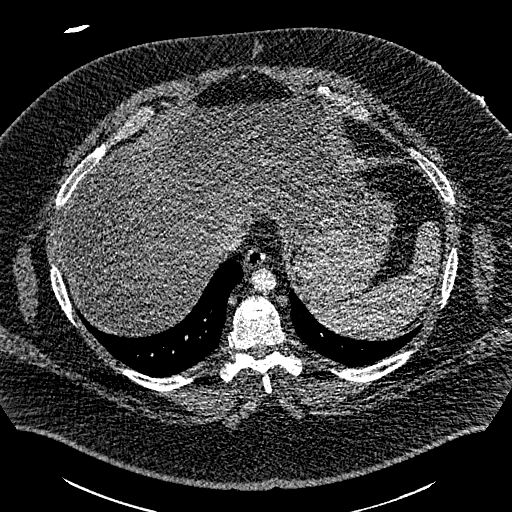
[im 81/270  lung]
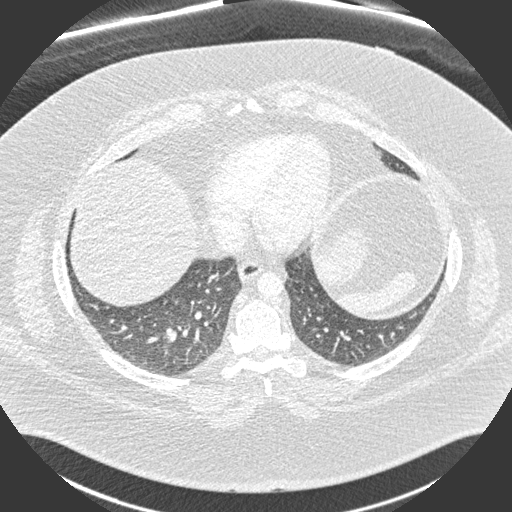
[im 95/270  mediastinal]
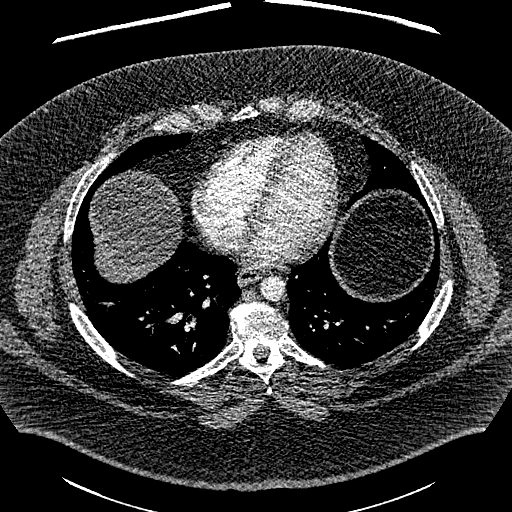
[im 108/270  lung]
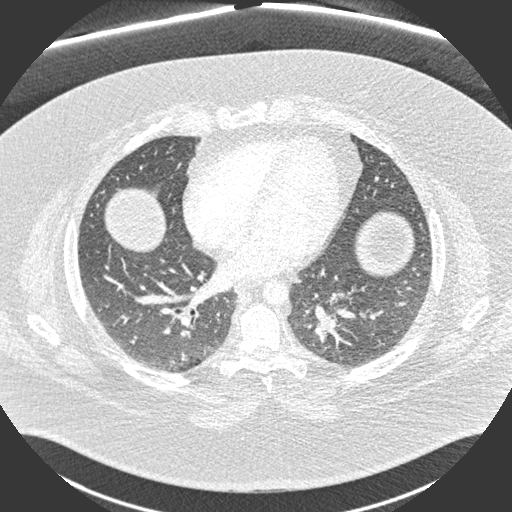
[im 122/270  mediastinal]
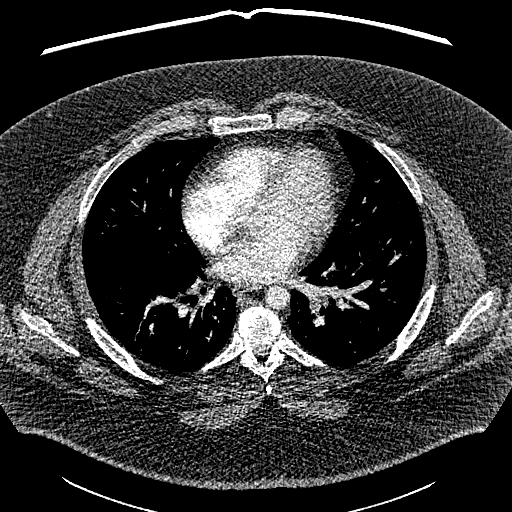
[im 135/270  lung]
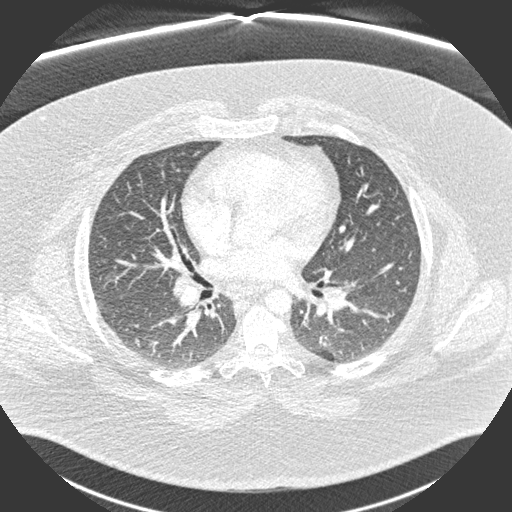
[im 148/270  mediastinal]
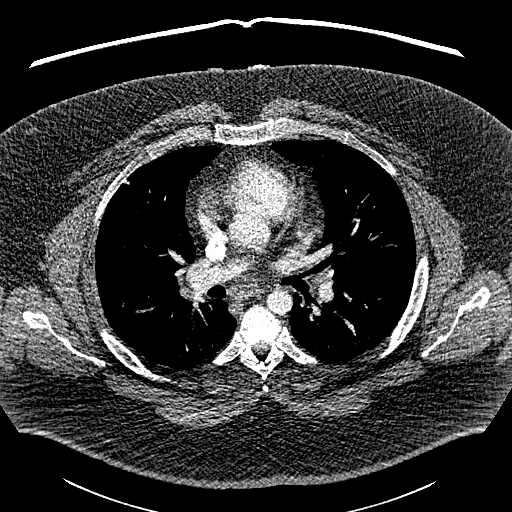
[im 162/270  lung]
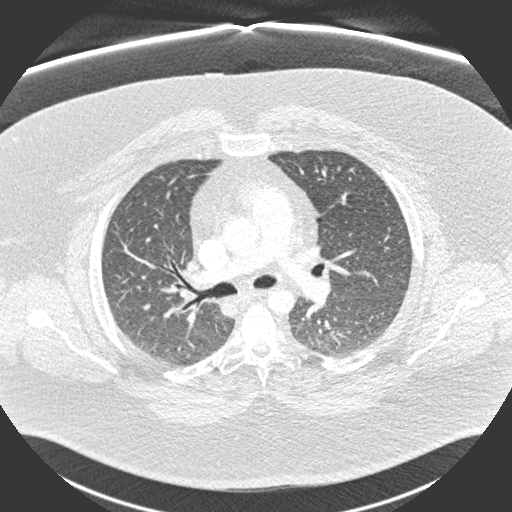
[im 175/270  mediastinal]
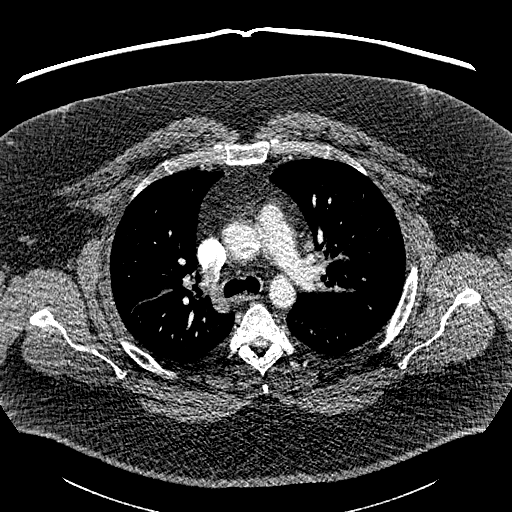
[im 189/270  lung]
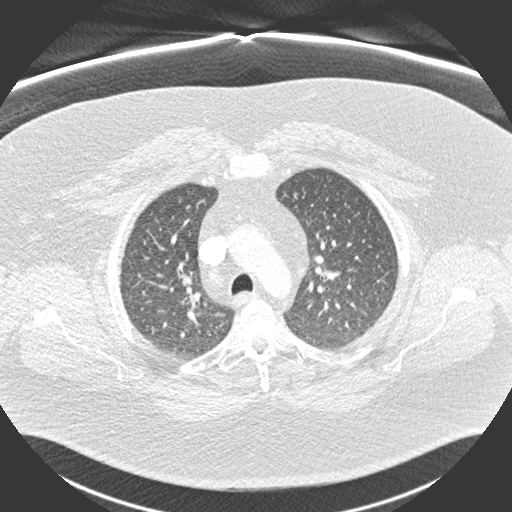
[im 216/270  mediastinal]
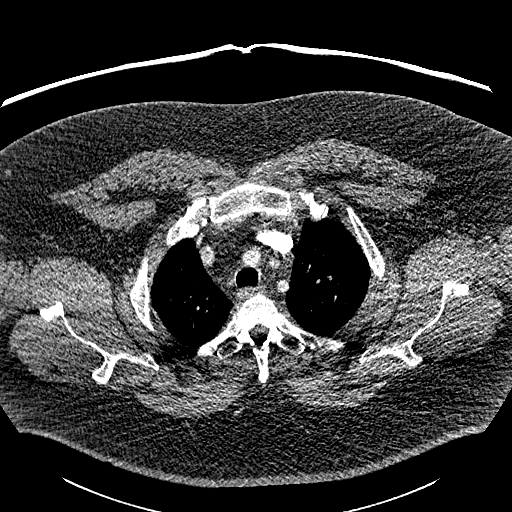
[im 229/270  lung]
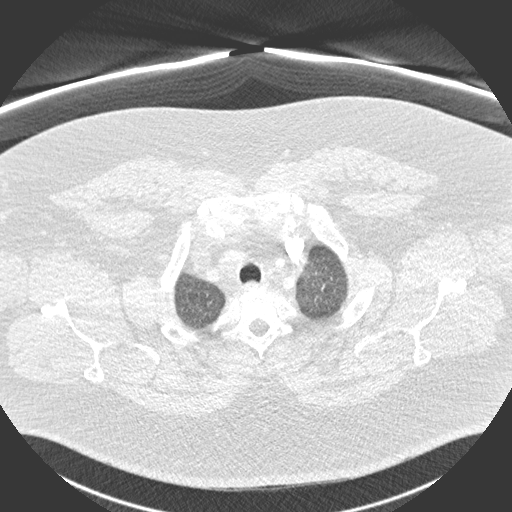
[im 243/270  mediastinal]
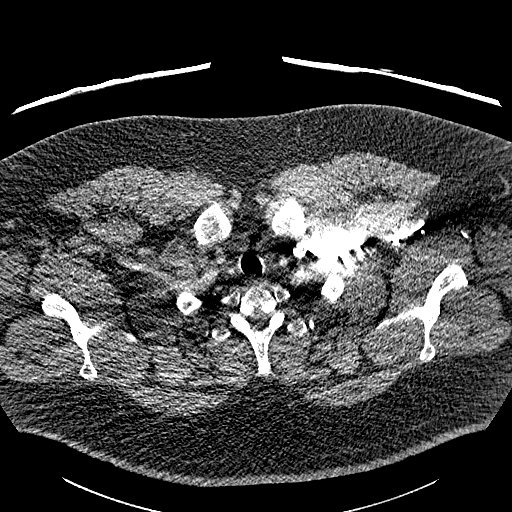
[im 256/270  lung]
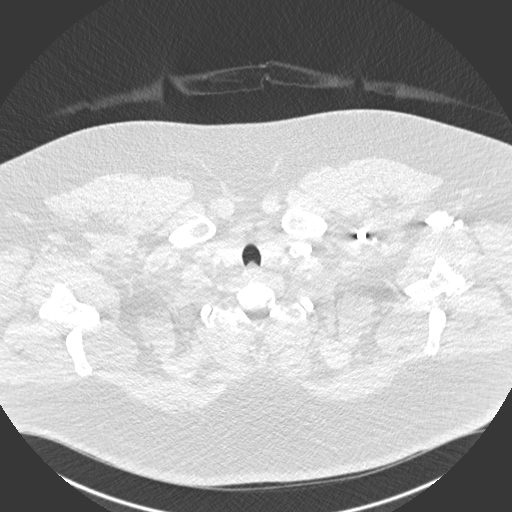

[Series 9: pe 2.0 coronal · coronal · 0.57mm/px · 1 of 153 slices shown]
[im 77/153  mediastinal]
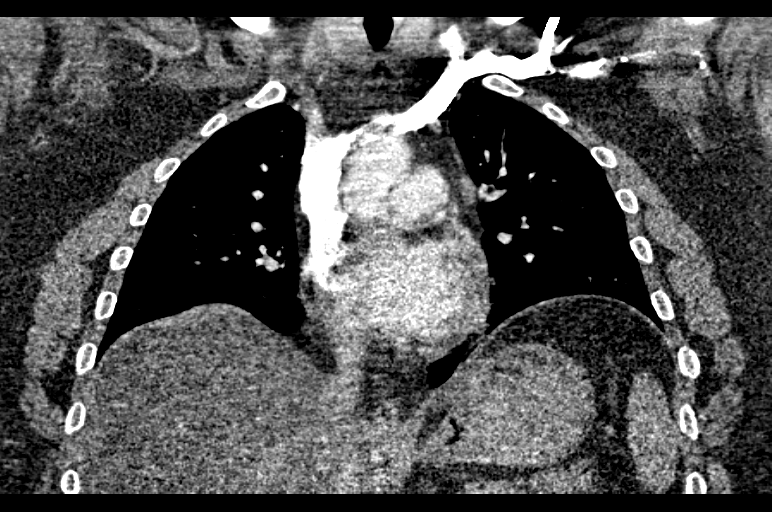

[18 of 36 positions shown; findings below may reference images not displayed]

FINDINGS: The pulmonary arteries appear satisfactorily opacified
with contrast.  However, assessment of the vessels beyond the
segmental branches is limited by body habitus.  No pulmonary emboli
are demonstrated.  The thoracic aorta appears normal.

Small hilar lymph nodes bilaterally are similar to the prior
examination.  There are no enlarged mediastinal lymph nodes.  There
is no pleural or pericardial effusion.

There are scattered small subpleural nodules, right greater than
left.  These appear unchanged compared with the prior study.  No
enlarging nodule or confluent airspace opacities demonstrated.
There is no endobronchial lesion.

There is severe hepatic steatosis.  The visualized upper abdomen
otherwise appears normal.

Review of the MIP images confirms the above findings.
IMPRESSION: 1.  No evidence of acute pulmonary embolism.  Study is limited by
body habitus.  Correlation with lower extremity venous Doppler
ultrasound should be considered given the report leg swelling.
2.  No acute chest findings.
3.  Stable small pulmonary nodules bilaterally consistent with a
benign etiology.
4.  Severe hepatic steatosis.

## 2011-07-13 IMAGING — US US EXTREM LOW VENOUS BILAT
1 series · 14 of 24 positions shown · non-contrast
Comparison: None.

CLINICAL DATA: Shortness of breath, bilateral leg swelling.  Right
leg pain.

VENOUS DUPLEX ULTRASOUND OF BILATERAL LOWER EXTREMITIES
TECHNIQUE: Gray-scale sonography with graded compression, as well
as color Doppler and duplex ultrasound, were performed to evaluate
the deep venous system of both lower extremities from the level of
the common femoral vein through the popliteal and proximal calf
veins.  Spectral Doppler was utilized to evaluate flow at rest and
with distal augmentation maneuvers.

[Series 1: us extrem low venous bilat · 14 of 45 slices shown]
[im 1/45]
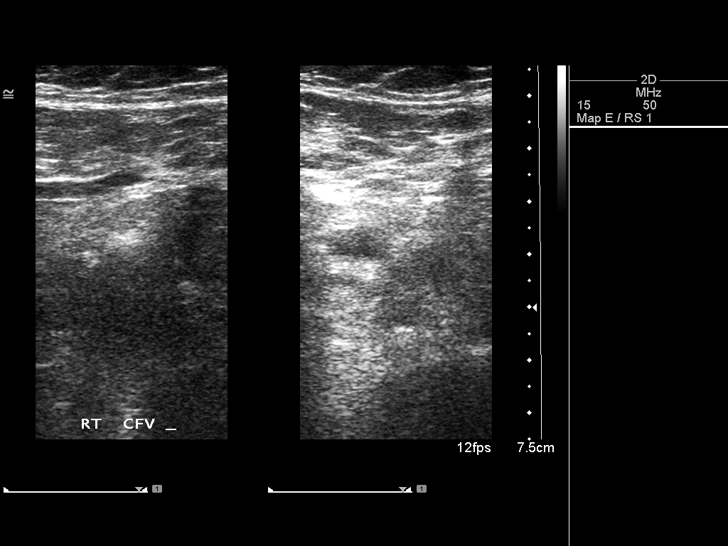
[im 4/45]
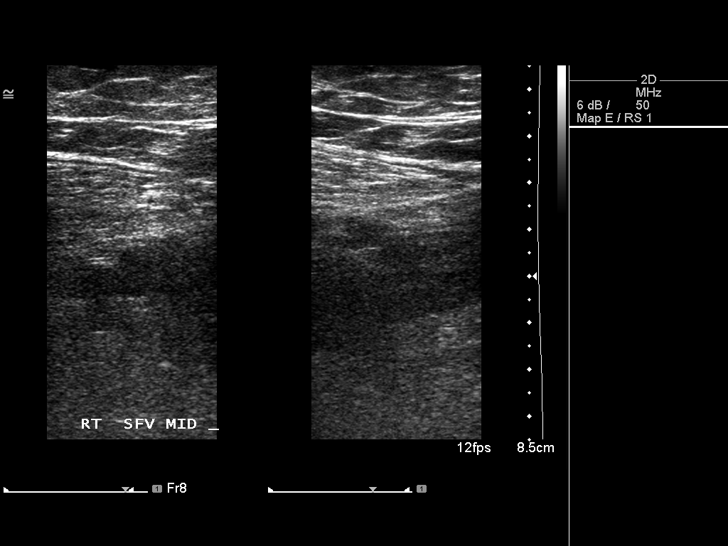
[im 8/45]
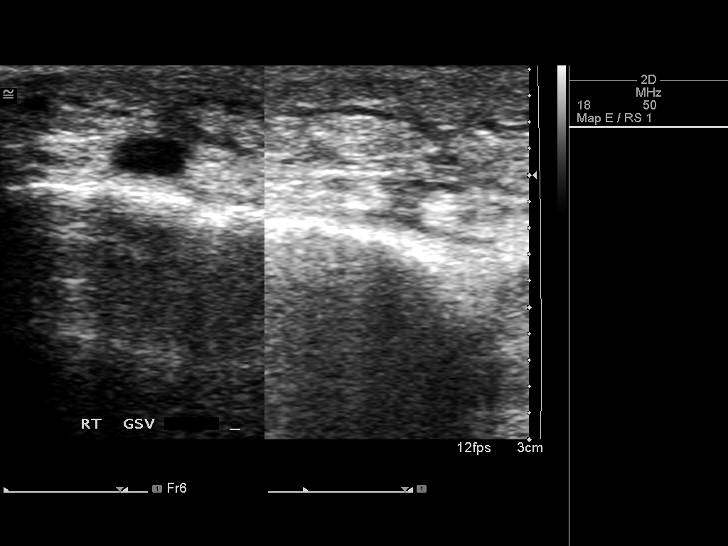
[im 12/45]
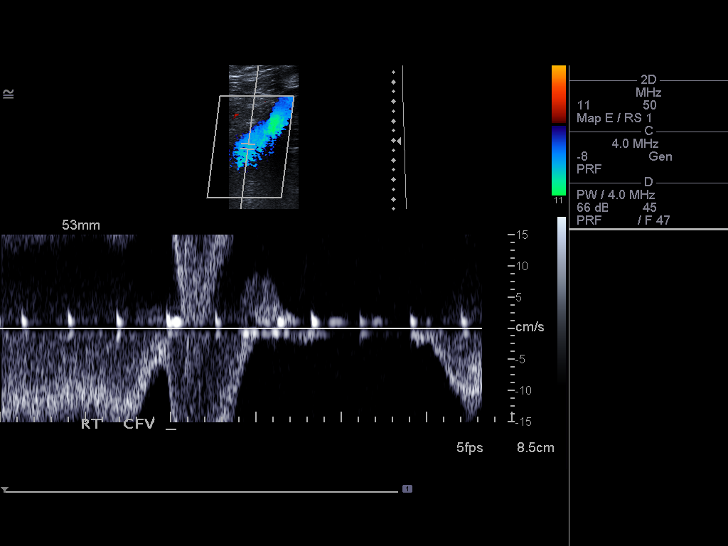
[im 14/45]
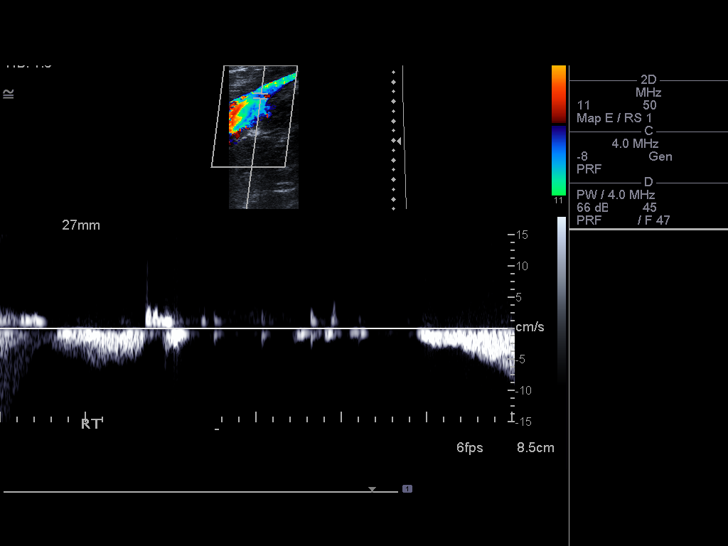
[im 18/45]
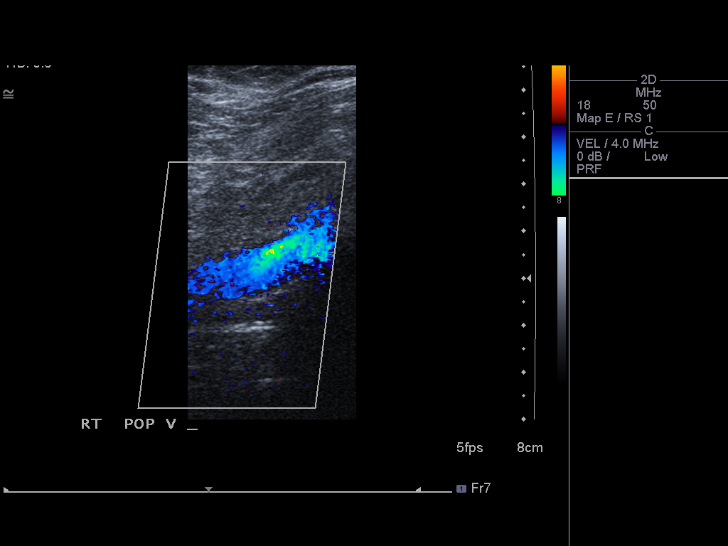
[im 22/45]
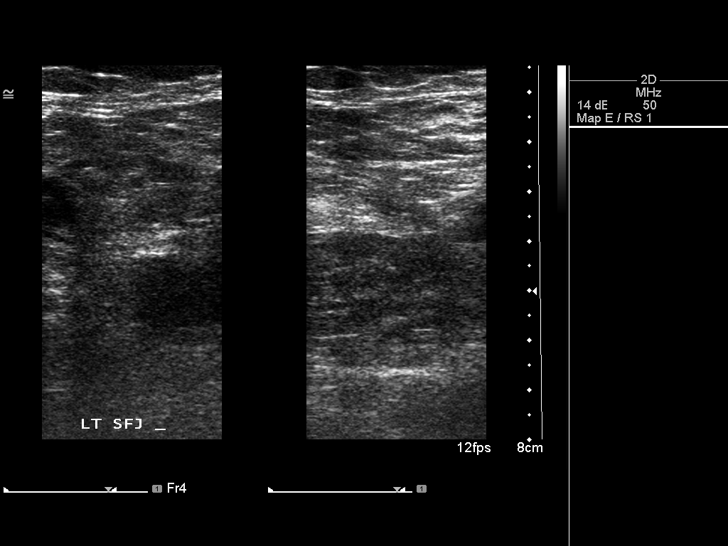
[im 23/45]
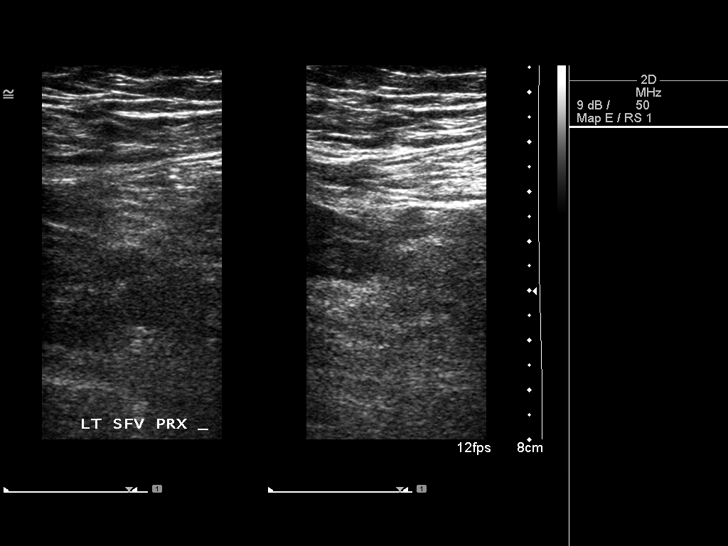
[im 27/45]
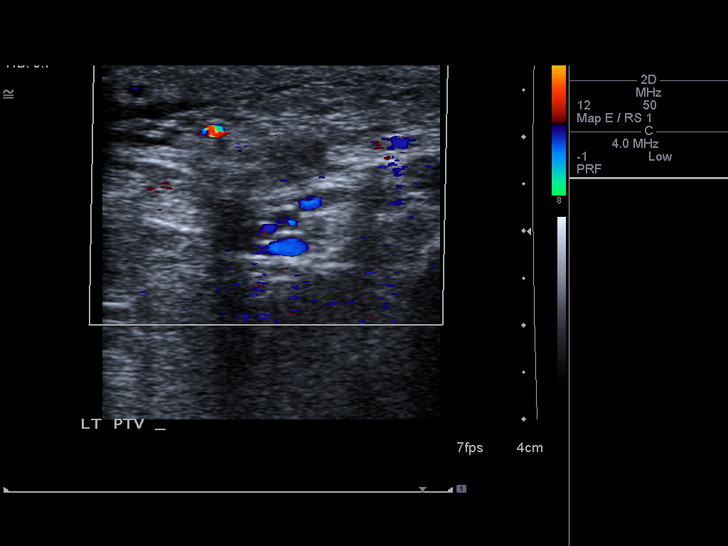
[im 31/45]
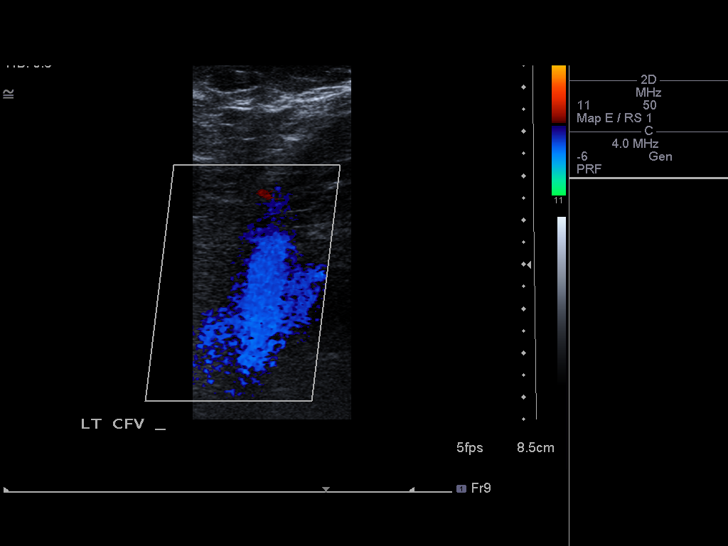
[im 35/45]
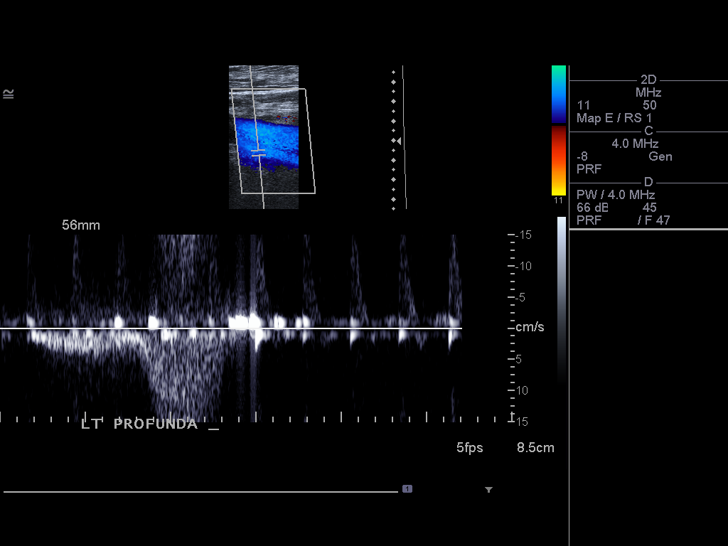
[im 37/45]
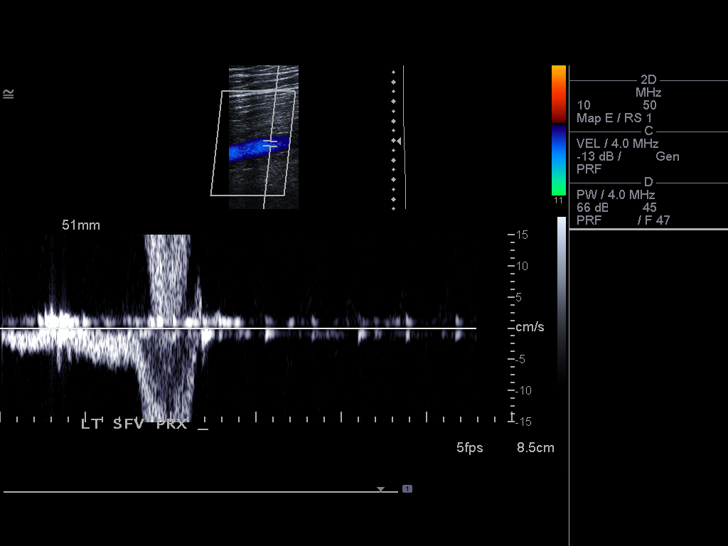
[im 41/45]
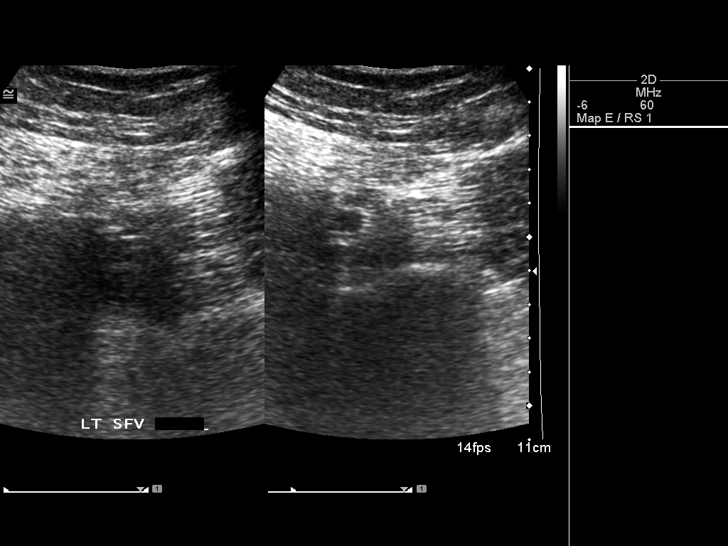
[im 45/45]
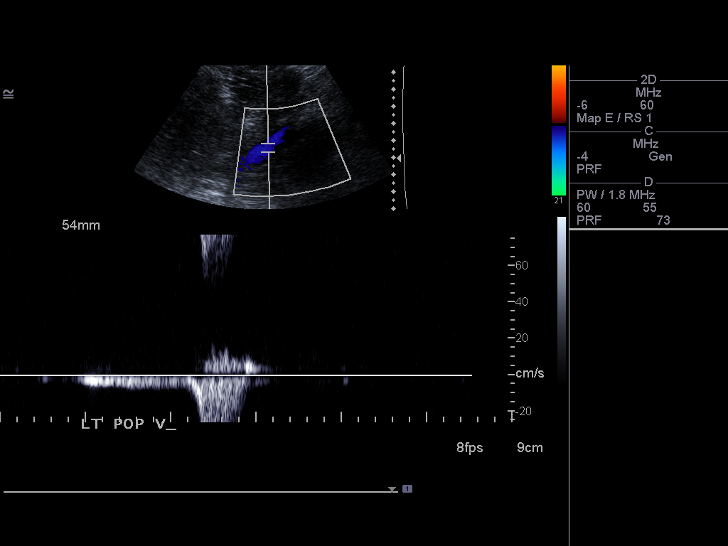

[14 of 24 positions shown; findings below may reference images not displayed]

FINDINGS: Exam quality is limited by body habitus.  There is normal
flow, compressibility and augmentation in the common femoral,
profunda femoral, femoral and popliteal veins bilaterally.
Visualization of the calf veins is limited bilaterally, due to body
habitus.
IMPRESSION: Exam quality is limited by body habitus.  No definite evidence of
deep venous thrombosis in the lower extremities bilaterally.

## 2011-09-06 ENCOUNTER — Emergency Department (HOSPITAL_COMMUNITY)
Admission: EM | Admit: 2011-09-06 | Discharge: 2011-09-06 | Disposition: A | Discharge status: Home / Self Care | Payer: Medicare Other | Attending: Emergency Medicine | Admitting: Emergency Medicine

## 2011-09-06 ENCOUNTER — Encounter (HOSPITAL_COMMUNITY): Payer: Self-pay | Admitting: Emergency Medicine

## 2011-09-06 DIAGNOSIS — R109 Unspecified abdominal pain: Secondary | ICD-10-CM | POA: Insufficient documentation

## 2011-09-06 DIAGNOSIS — R10819 Abdominal tenderness, unspecified site: Secondary | ICD-10-CM | POA: Insufficient documentation

## 2011-09-06 DIAGNOSIS — I10 Essential (primary) hypertension: Secondary | ICD-10-CM | POA: Insufficient documentation

## 2011-09-06 DIAGNOSIS — E119 Type 2 diabetes mellitus without complications: Secondary | ICD-10-CM | POA: Insufficient documentation

## 2011-09-06 LAB — URINALYSIS, ROUTINE W REFLEX MICROSCOPIC
Bilirubin Urine: NEGATIVE
Glucose, UA: NEGATIVE mg/dL
Hgb urine dipstick: NEGATIVE
Ketones, ur: NEGATIVE mg/dL
Leukocytes, UA: NEGATIVE
Nitrite: NEGATIVE
Protein, ur: NEGATIVE mg/dL
Specific Gravity, Urine: 1.012 (ref 1.005–1.030)
Urobilinogen, UA: 0.2 mg/dL (ref 0.0–1.0)
pH: 8 (ref 5.0–8.0)

## 2011-09-06 LAB — CBC WITH DIFFERENTIAL/PLATELET
Basophils Absolute: 0 10*3/uL (ref 0.0–0.1)
Basophils Relative: 0 % (ref 0–1)
Eosinophils Absolute: 0.2 10*3/uL (ref 0.0–0.7)
Eosinophils Relative: 1 % (ref 0–5)
HCT: 47.7 % (ref 39.0–52.0)
Hemoglobin: 16.5 g/dL (ref 13.0–17.0)
Lymphocytes Relative: 16 % (ref 12–46)
Lymphs Abs: 2.2 10*3/uL (ref 0.7–4.0)
MCH: 30.9 pg (ref 26.0–34.0)
MCHC: 34.6 g/dL (ref 30.0–36.0)
MCV: 89.3 fL (ref 78.0–100.0)
Monocytes Absolute: 0.9 10*3/uL (ref 0.1–1.0)
Monocytes Relative: 7 % (ref 3–12)
Neutro Abs: 10.7 10*3/uL — ABNORMAL HIGH (ref 1.7–7.7)
Neutrophils Relative %: 77 % (ref 43–77)
Platelets: 298 10*3/uL (ref 150–400)
RBC: 5.34 MIL/uL (ref 4.22–5.81)
RDW: 13.1 % (ref 11.5–15.5)
WBC: 13.9 10*3/uL — ABNORMAL HIGH (ref 4.0–10.5)

## 2011-09-06 LAB — LIPASE, BLOOD: Lipase: 17 U/L (ref 11–59)

## 2011-09-06 LAB — COMPREHENSIVE METABOLIC PANEL
AST: 34 U/L (ref 0–37)
Albumin: 3.2 g/dL — ABNORMAL LOW (ref 3.5–5.2)
Alkaline Phosphatase: 57 U/L (ref 39–117)
BUN: 9 mg/dL (ref 6–23)
Potassium: 4.8 mEq/L (ref 3.5–5.1)
Total Protein: 7.9 g/dL (ref 6.0–8.3)

## 2011-09-06 MED ORDER — HYDROMORPHONE HCL PF 1 MG/ML IJ SOLN
1.0000 mg | Freq: Once | INTRAMUSCULAR | Status: AC
  Administered 2011-09-06: 1 mg via INTRAVENOUS
  Filled 2011-09-06: qty 1

## 2011-09-06 MED ORDER — HYDROMORPHONE HCL PF 1 MG/ML IJ SOLN
1.0000 mg | Freq: Once | INTRAMUSCULAR | Status: AC
  Administered 2011-09-06: 1 mg via INTRAMUSCULAR
  Filled 2011-09-06: qty 1

## 2011-09-06 MED ORDER — SUCRALFATE 1 G PO TABS: 1.0000 g | ORAL_TABLET | Freq: Four times a day (QID) | ORAL | Status: DC

## 2011-09-06 MED ORDER — HYOSCYAMINE SULFATE 0.125 MG PO TABS
0.1250 mg | ORAL_TABLET | Freq: Once | ORAL | Status: AC
  Administered 2011-09-06: 0.125 mg via ORAL
  Filled 2011-09-06 (×2): qty 1

## 2011-09-06 MED ORDER — HYDROMORPHONE HCL PF 2 MG/ML IJ SOLN
2.0000 mg | Freq: Once | INTRAMUSCULAR | Status: AC
  Administered 2011-09-06: 2 mg via INTRAMUSCULAR
  Filled 2011-09-06: qty 1

## 2011-09-06 MED ORDER — OXYCODONE HCL 30 MG PO TABS: 30.0000 mg | ORAL_TABLET | Freq: Four times a day (QID) | ORAL | Status: AC | PRN

## 2011-09-06 MED ORDER — SUCRALFATE 1 G PO TABS
1.0000 g | ORAL_TABLET | Freq: Once | ORAL | Status: AC
  Administered 2011-09-06: 1 g via ORAL
  Filled 2011-09-06 (×2): qty 1

## 2011-09-06 NOTE — ED Notes (Signed)
Provided pt with blanket.

## 2011-09-06 NOTE — ED Notes (Signed)
Provided pt with ice chips per pt request.

## 2011-09-06 NOTE — ED Notes (Addendum)
Per EMS; pt sts upper abd pain with vomiting and some black tarry thick stool and then pain moved to lower abd; pt sts some SOB with exertion; pt more comfortable when laying flat; pt denies nausea at present; pt with hx of multiple visits for same in past

## 2011-09-06 NOTE — ED Notes (Signed)
Provider at bedside

## 2011-09-06 NOTE — ED Provider Notes (Signed)
History     CSN: [id number]  Arrival date & time 09/06/11  4098   First MD Initiated Contact with Patient 09/06/11 1104      Chief Complaint  Patient presents with  . Abdominal Pain    (Consider location/radiation/quality/duration/timing/severity/associated sxs/prior treatment) Patient is a 36 y.o. male presenting with abdominal pain. The history is provided by the patient.  Abdominal Pain The primary symptoms of the illness include abdominal pain, nausea and vomiting. The primary symptoms of the illness do not include fever or dysuria.  The patient has not had a change in bowel habit. Associated symptoms comments: He returns to ED with complaint of recurrent right sided abdominal pain that is severe. It causes nausea and vomiting. No known fever. No diarrhea. He reports multiple episodes of similar pain. He reports having seen GI in the past for evaluation but states there was no diagnosis. He denies recent endoscopy. He has been taking his usual medications, including a "stomach"medication. . Significant associated medical issues include diabetes.    Past Medical History  Diagnosis Date  . Heart valve disorder   . Hypertension   . Asthma   . Diabetes mellitus   . Hyperlipidemia   . Anaphylactic reaction   . Bipolar disorder   . Adult ADHD     Past Surgical History  Procedure Date  . Carpal tunnel release     History reviewed. No pertinent family history.  History  Substance Use Topics  . Smoking status: Current Everyday Smoker -- 2.0 packs/day for 23 years  . Smokeless tobacco: Not on file  . Alcohol Use: No      Review of Systems  Constitutional: Negative for fever.  Cardiovascular: Negative for chest pain.  Gastrointestinal: Positive for nausea, vomiting and abdominal pain.  Genitourinary: Negative for dysuria.    Allergies  Atenolol; Other; and Tylenol  Home Medications   Current Outpatient Rx  Name Route Sig Dispense Refill  .  AMPHETAMINE-DEXTROAMPHETAMINE 30 MG PO TABS Oral Take 30 mg by mouth 2 (two) times daily.    Marland Kitchen CLONIDINE HCL 0.2 MG PO TABS Oral Take 0.2 mg by mouth daily.    Marland Kitchen DIAZEPAM 5 MG PO TABS Oral Take 5 mg by mouth every 6 (six) hours as needed.    Marland Kitchen FLUTICASONE-SALMETEROL 250-50 MCG/DOSE IN AEPB Inhalation Inhale 1 puff into the lungs every 12 (twelve) hours.    Marland Kitchen FOLIC ACID 1 MG PO TABS Oral Take 1 mg by mouth daily.    Marland Kitchen LISINOPRIL 10 MG PO TABS Oral Take 20 mg by mouth daily.     . OXYCODONE HCL 30 MG PO TABS Oral Take 30 mg by mouth every 4 (four) hours as needed. For pain.    Marland Kitchen SITAGLIPTIN-METFORMIN HCL 50-500 MG PO TABS Oral Take 1 tablet by mouth 2 (two) times daily with a meal.    . TESTOSTERONE 50 MG/5GM TD GEL Transdermal Place 10 g onto the skin 2 (two) times daily.      BP 108/71  Pulse 91  Temp 97.5 F (36.4 C) (Oral)  Resp 24  SpO2 96%  Physical Exam  Constitutional: He is oriented to person, place, and time. He appears well-developed and well-nourished. No distress.  HENT:  Mouth/Throat: Oropharynx is clear and moist.  Cardiovascular: Normal rate and regular rhythm.   No murmur heard. Pulmonary/Chest: Effort normal and breath sounds normal. He has no wheezes.  Abdominal: Soft. He exhibits no mass.       Morbidly obese abdomen  that is soft on palpation. No mass. BS hypoactive x 4 quadrants. Tender to RUQ and right lateral abdominal wall.   Neurological: He is alert and oriented to person, place, and time.  Skin: Skin is warm and dry.    ED Course  Procedures (including critical care time)  Labs Reviewed  COMPREHENSIVE METABOLIC PANEL - Abnormal; Notable for the following:    Sodium 132 (*)     Glucose, Bld 151 (*)     Albumin 3.2 (*)     All other components within normal limits  CBC WITH DIFFERENTIAL - Abnormal; Notable for the following:    WBC 13.9 (*)     Neutro Abs 10.7 (*)     All other components within normal limits  LIPASE, BLOOD  URINALYSIS, ROUTINE W  REFLEX MICROSCOPIC   Results for orders placed during the hospital encounter of 09/06/11  COMPREHENSIVE METABOLIC PANEL      Component Value Range   Sodium 132 (*) 135 - 145 mEq/L   Potassium 4.8  3.5 - 5.1 mEq/L   Chloride 98  96 - 112 mEq/L   CO2 20  19 - 32 mEq/L   Glucose, Bld 151 (*) 70 - 99 mg/dL   BUN 9  6 - 23 mg/dL   Creatinine, Ser 9.60  0.50 - 1.35 mg/dL   Calcium 9.5  8.4 - 45.4 mg/dL   Total Protein 7.9  6.0 - 8.3 g/dL   Albumin 3.2 (*) 3.5 - 5.2 g/dL   AST 34  0 - 37 U/L   ALT 35  0 - 53 U/L   Alkaline Phosphatase 57  39 - 117 U/L   Total Bilirubin 0.3  0.3 - 1.2 mg/dL   GFR calc non Af Amer >90  >90 mL/min   GFR calc Af Amer >90  >90 mL/min  CBC WITH DIFFERENTIAL      Component Value Range   WBC 13.9 (*) 4.0 - 10.5 K/uL   RBC 5.34  4.22 - 5.81 MIL/uL   Hemoglobin 16.5  13.0 - 17.0 g/dL   HCT 09.8  11.9 - 14.7 %   MCV 89.3  78.0 - 100.0 fL   MCH 30.9  26.0 - 34.0 pg   MCHC 34.6  30.0 - 36.0 g/dL   RDW 82.9  56.2 - 13.0 %   Platelets 298  150 - 400 K/uL   Neutrophils Relative 77  43 - 77 %   Neutro Abs 10.7 (*) 1.7 - 7.7 K/uL   Lymphocytes Relative 16  12 - 46 %   Lymphs Abs 2.2  0.7 - 4.0 K/uL   Monocytes Relative 7  3 - 12 %   Monocytes Absolute 0.9  0.1 - 1.0 K/uL   Eosinophils Relative 1  0 - 5 %   Eosinophils Absolute 0.2  0.0 - 0.7 K/uL   Basophils Relative 0  0 - 1 %   Basophils Absolute 0.0  0.0 - 0.1 K/uL  LIPASE, BLOOD      Component Value Range   Lipase 17  11 - 59 U/L  URINALYSIS, ROUTINE W REFLEX MICROSCOPIC      Component Value Range   Color, Urine YELLOW  YELLOW   APPearance CLEAR  CLEAR   Specific Gravity, Urine 1.012  1.005 - 1.030   pH 8.0  5.0 - 8.0   Glucose, UA NEGATIVE  NEGATIVE mg/dL   Hgb urine dipstick NEGATIVE  NEGATIVE   Bilirubin Urine NEGATIVE  NEGATIVE   Ketones, ur  NEGATIVE  NEGATIVE mg/dL   Protein, ur NEGATIVE  NEGATIVE mg/dL   Urobilinogen, UA 0.2  0.0 - 1.0 mg/dL   Nitrite NEGATIVE  NEGATIVE   Leukocytes, UA  NEGATIVE  NEGATIVE    No results found.   No diagnosis found.  1. Abdominal pain, recurrent 2. Morbid obesity   MDM  Records reviewed. Multiple CT scans of abd/pelvis and abdominal ultrasounds over a 2-year period, all negative for any acute finding and all ultrasounds negative for gall stones. Patient reports symptoms are similar to previous pain. Pain addressed with medication. He will be referred to GI and to PCP for further outpatient investigation, but I do not feel repeat imaging at this time will offer additional information.         Rodena Medin, PA-C 09/06/11 1318

## 2011-09-07 NOTE — ED Provider Notes (Signed)
Medical screening examination/treatment/procedure(s) were performed by non-physician practitioner and as supervising physician I was immediately available for consultation/collaboration.   Carleene Cooper III, MD 09/07/11 1000

## 2011-11-26 IMAGING — CR DG CHEST 2V
4 series · 4 of 4 positions shown · non-contrast
Comparison: CT chest 10/11/2009 and plain films chest 08/19/2009.

CLINICAL DATA: Shortness of breath and chest pain.

CHEST - 2 VIEW

[w chest lat * (1 of 2)]
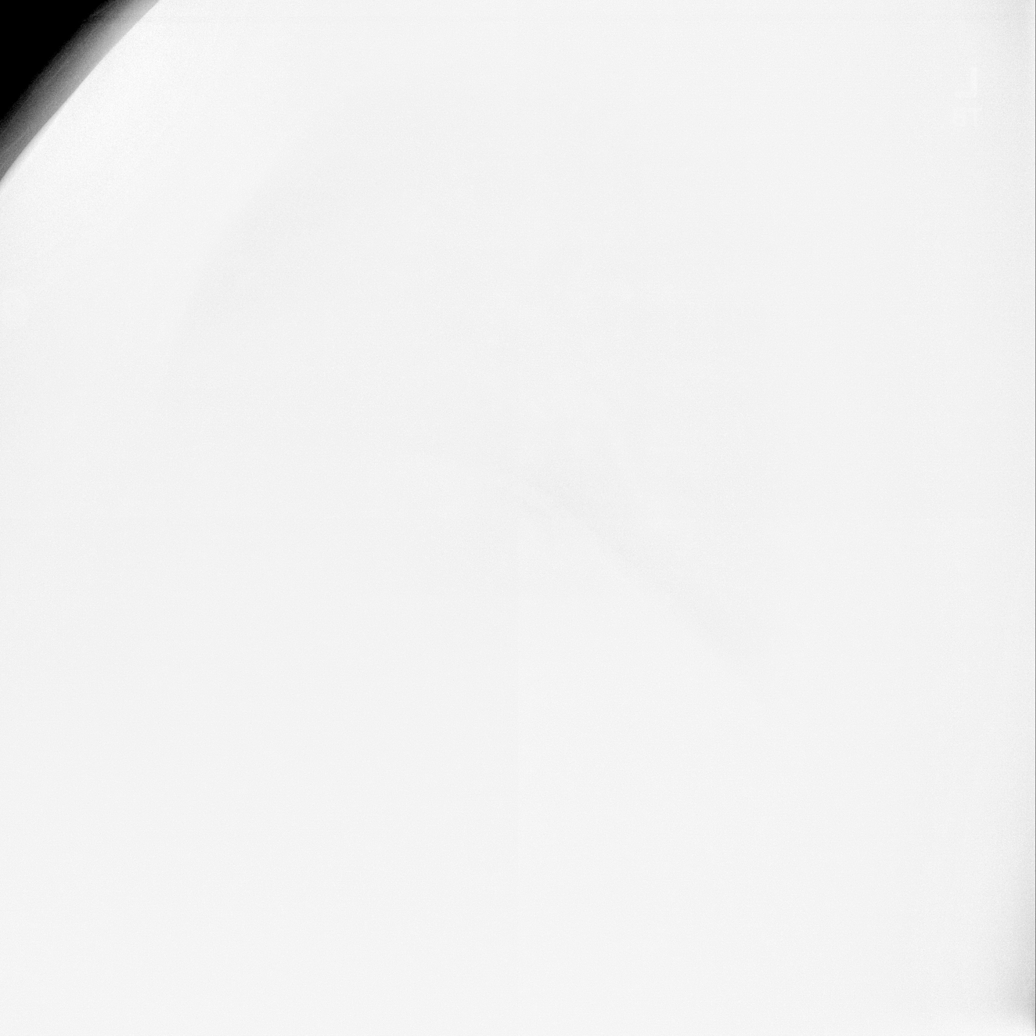

[w chest lat * (2 of 2)]
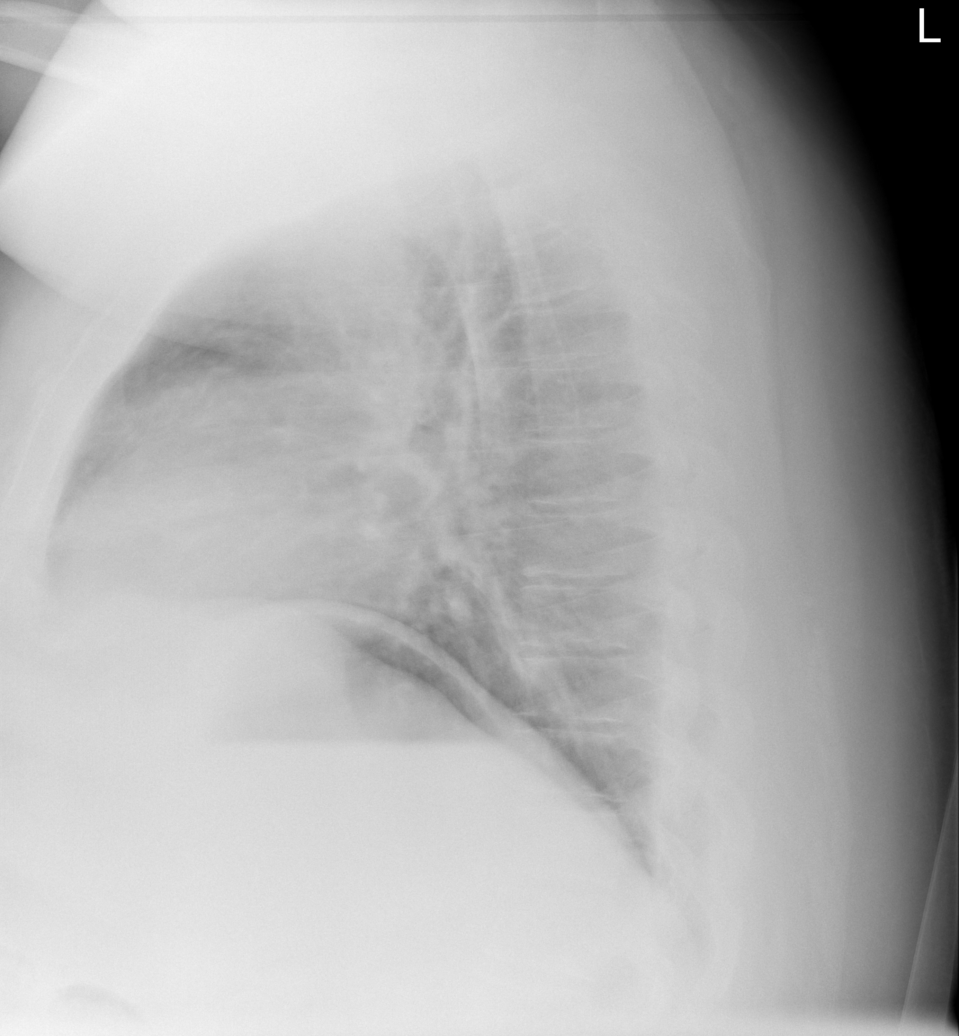

[w chest pa (1 of 2)]
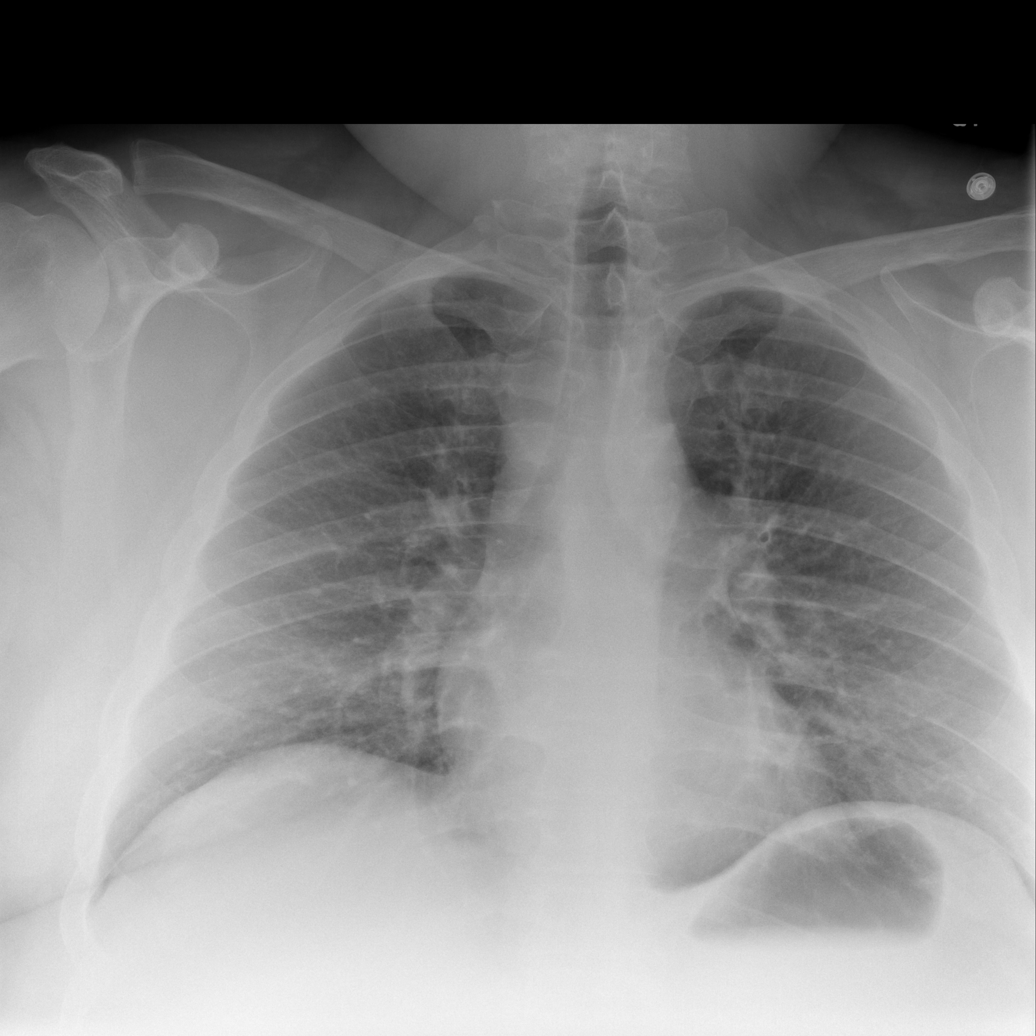

[w chest pa (2 of 2)]
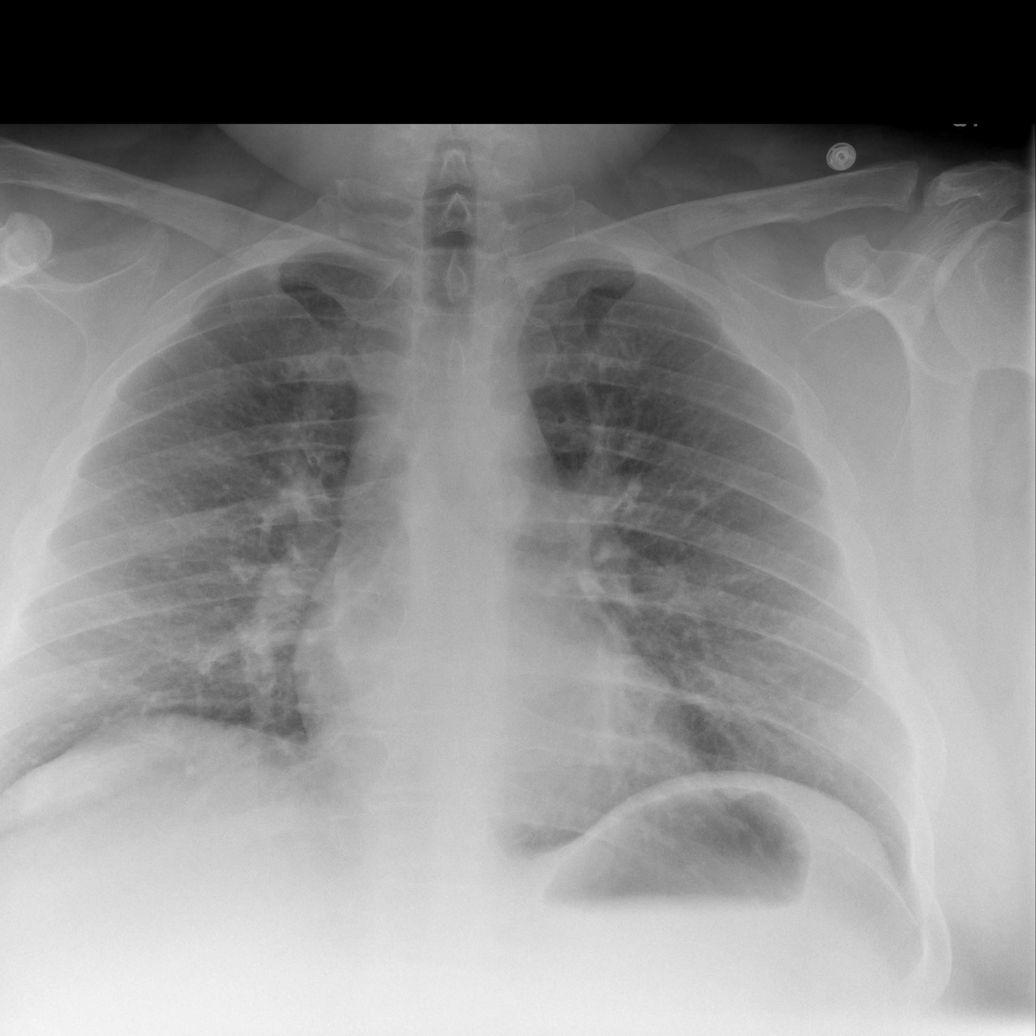

[4 of 4 positions shown; findings below may reference images not displayed]

FINDINGS: Lungs are clear.  Heart size is normal.  No pneumothorax
or pleural effusion.
IMPRESSION: No acute disease.

## 2011-11-26 IMAGING — CT CT ABD-PELV W/ CM
2 of 4 series · 17 of 46 positions shown, 19 images · IV contrast (APPLIED)
Comparison: CT abdomen and pelvis 09/10/2009.

CLINICAL DATA: Abdominal pain.  Blood in stool.

CT ABDOMEN AND PELVIS WITH CONTRAST
TECHNIQUE: Multidetector CT imaging of the abdomen and pelvis was
performed following the standard protocol during bolus
administration of intravenous contrast.
Contrast: 100 ml Smnipaque-QPP.

[Series 2: abd/pelvis 5.0 b31f · axial · 0.98mm/px · z∈[+1006,+1501]mm · 14 of 109 slices shown, 16 images]
[im 5/109  soft-tissue]
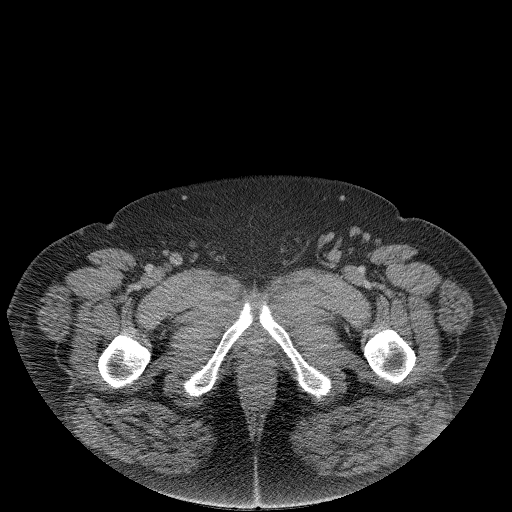
[im 5/109  bone]
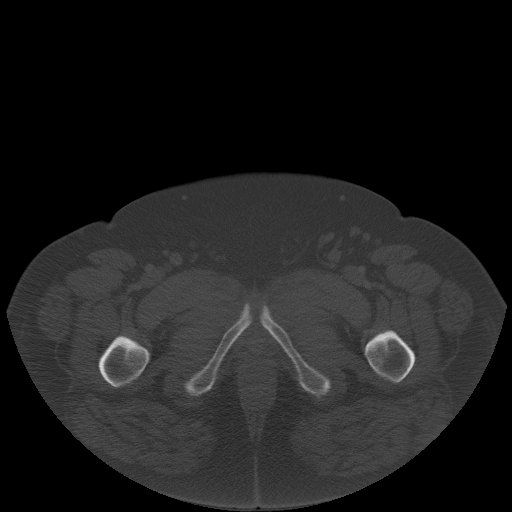
[im 14/109  soft-tissue]
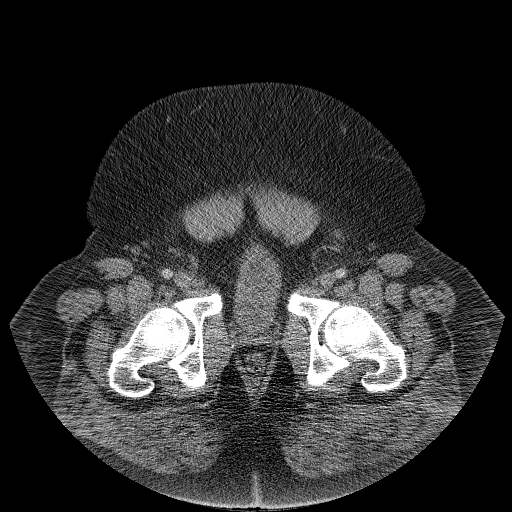
[im 23/109  soft-tissue]
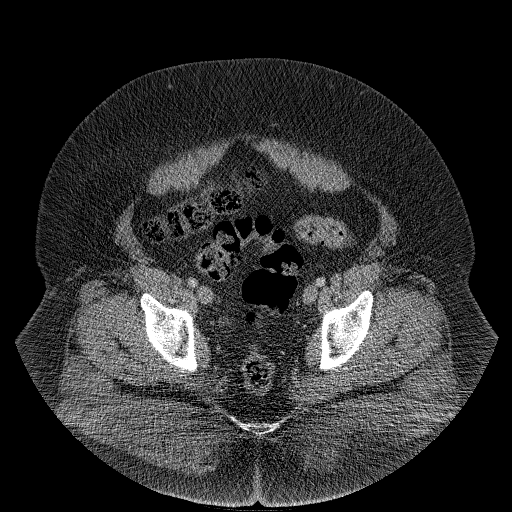
[im 28/109  soft-tissue]
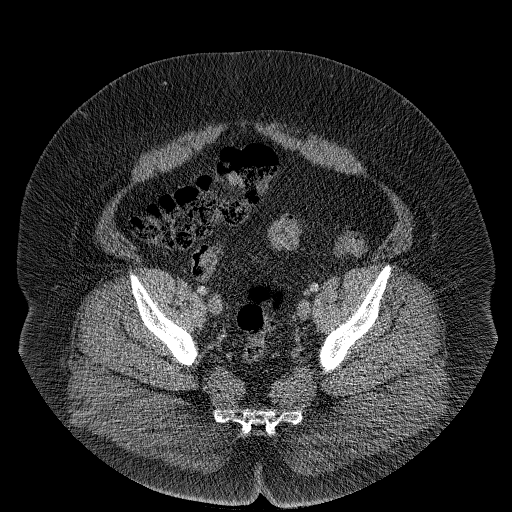
[im 37/109  soft-tissue]
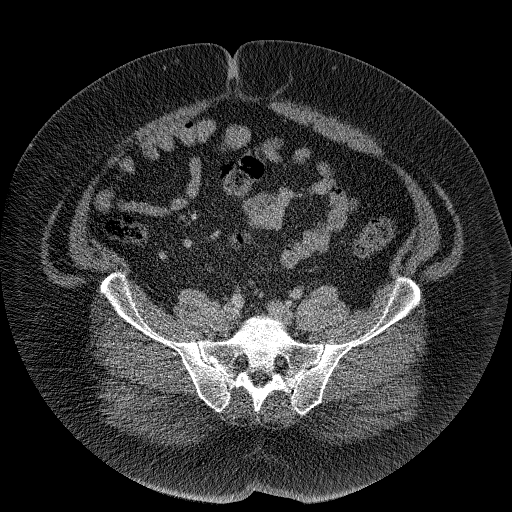
[im 46/109  soft-tissue]
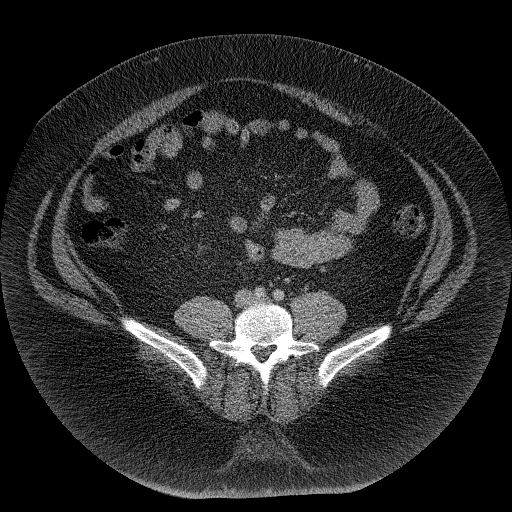
[im 50/109  soft-tissue]
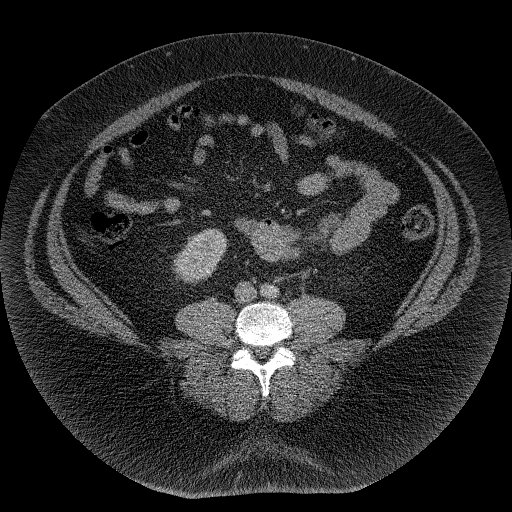
[im 59/109  soft-tissue]
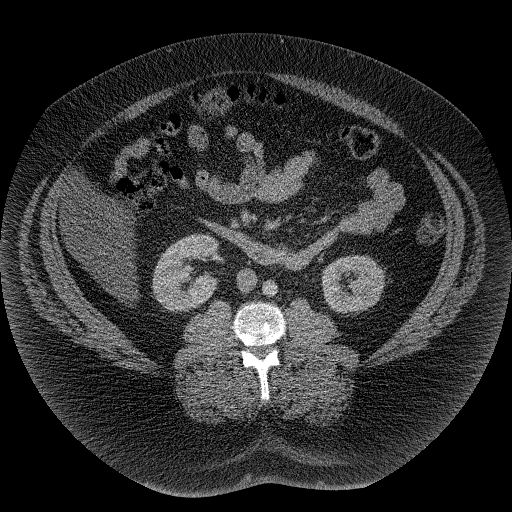
[im 64/109  soft-tissue]
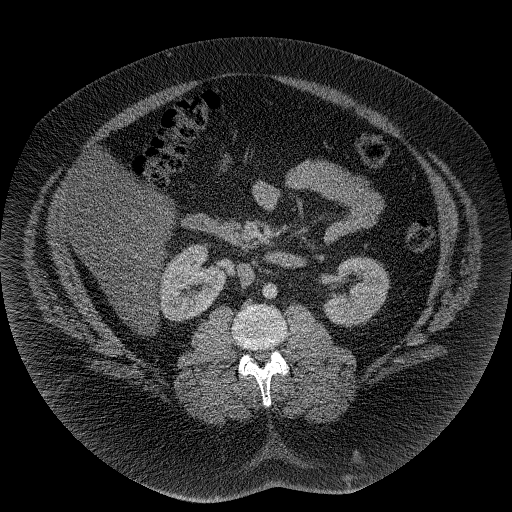
[im 64/109  bone]
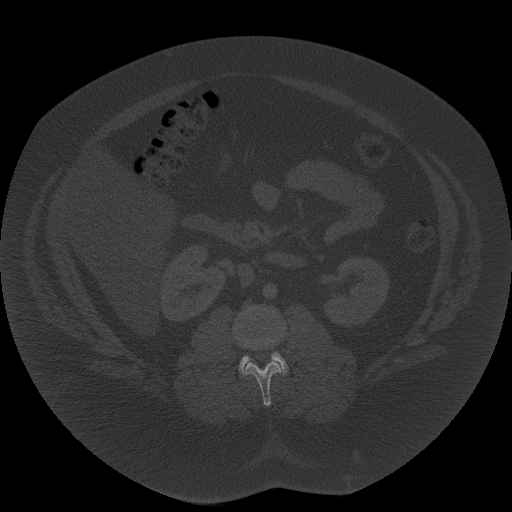
[im 73/109  soft-tissue]
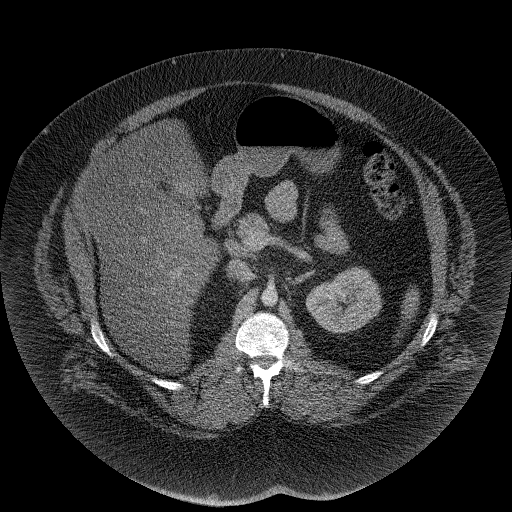
[im 82/109  soft-tissue]
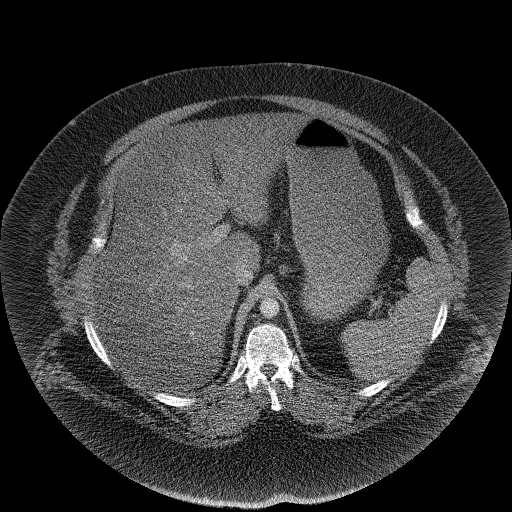
[im 86/109  soft-tissue]
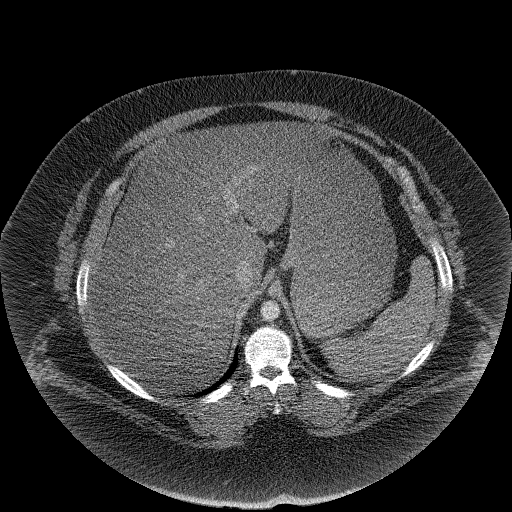
[im 95/109  soft-tissue]
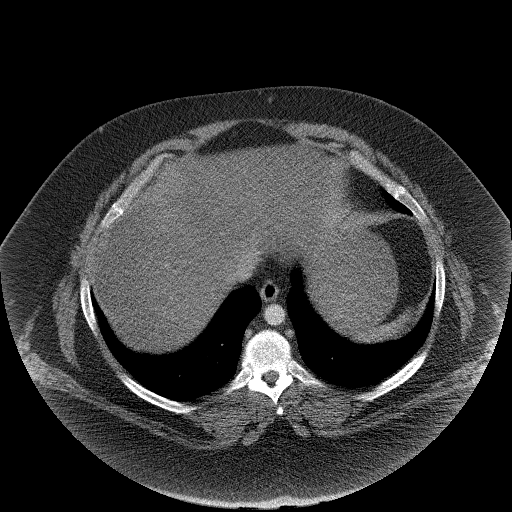
[im 104/109  soft-tissue]
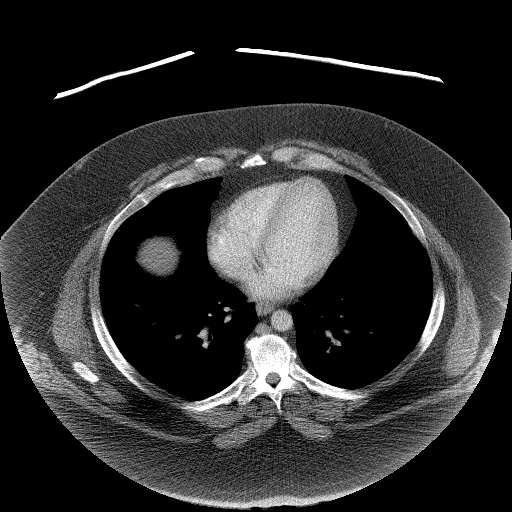

[Series 5: abd/pelvis 3.0 coronal · coronal · 1.05mm/px · 3 of 142 slices shown]
[im 48/142  soft-tissue]
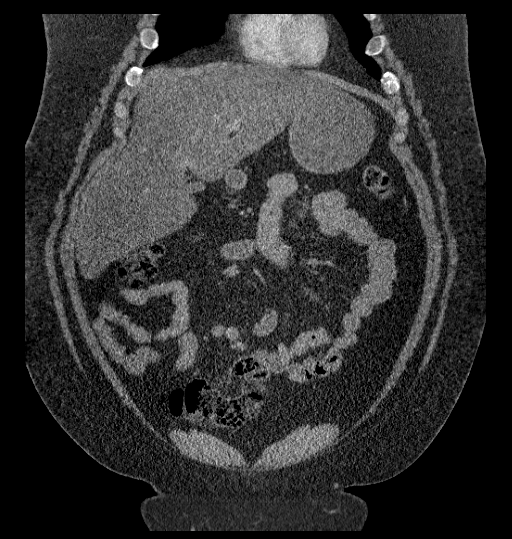
[im 63/142  soft-tissue]
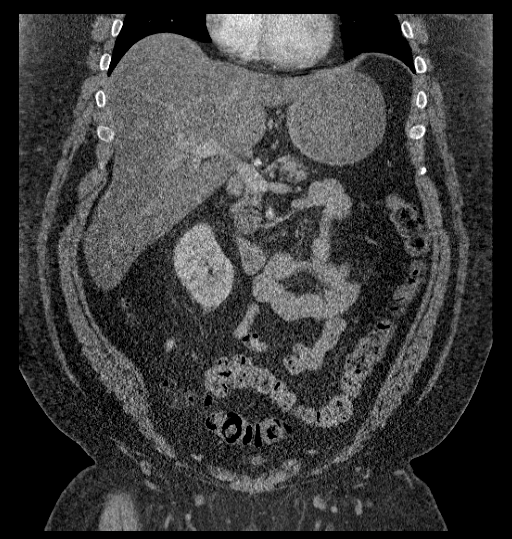
[im 79/142  soft-tissue]
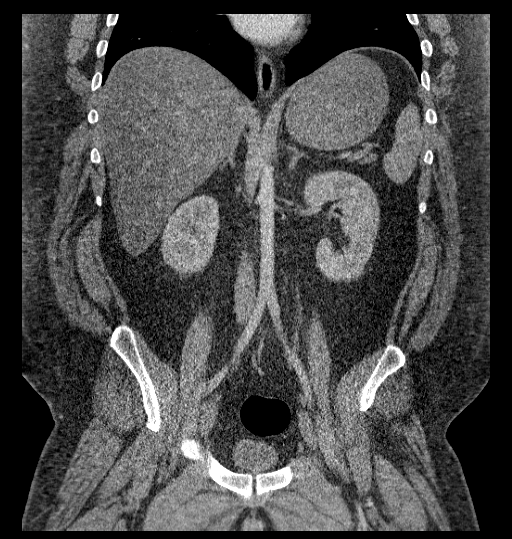

[17 of 46 positions shown; findings below may reference images not displayed]

FINDINGS: Lung bases are clear.  No pleural or pericardial
effusion.

There is diffuse low attenuation of the liver consistent with fatty
change.  Liver  measures 26.5 cm cranial-caudal.  No focal liver
lesion.  The gallbladder is decompressed but otherwise
unremarkable.  The spleen, adrenal glands, pancreas and kidneys
appear normal.  The stomach, small and large bowel and appendix
appear normal.  No lymphadenopathy or fluid.  No focal bony
abnormality.
IMPRESSION: 1.  No acute finding.
2.  Fatty infiltration of the liver hepatomegaly.

## 2011-12-21 ENCOUNTER — Other Ambulatory Visit: Payer: Self-pay | Admitting: Internal Medicine
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
## 2012-02-14 IMAGING — CR DG KNEE 1-2V*R*
1 series · 3 of 3 positions shown · non-contrast
Comparison: none

REASON FOR EXAM: pain
COMMENTS:

PROCEDURE:     DXR - DXR KNEE RIGHT AP AND LATERAL  - May 17, 2010  [DATE]
RESULT:     Images of the right knee show no definite fracture, dislocation
or radiopaque foreign body.

[Series 1: view not recorded · 0.17mm/px · 3 of 3 slices shown]
[im 1/3]
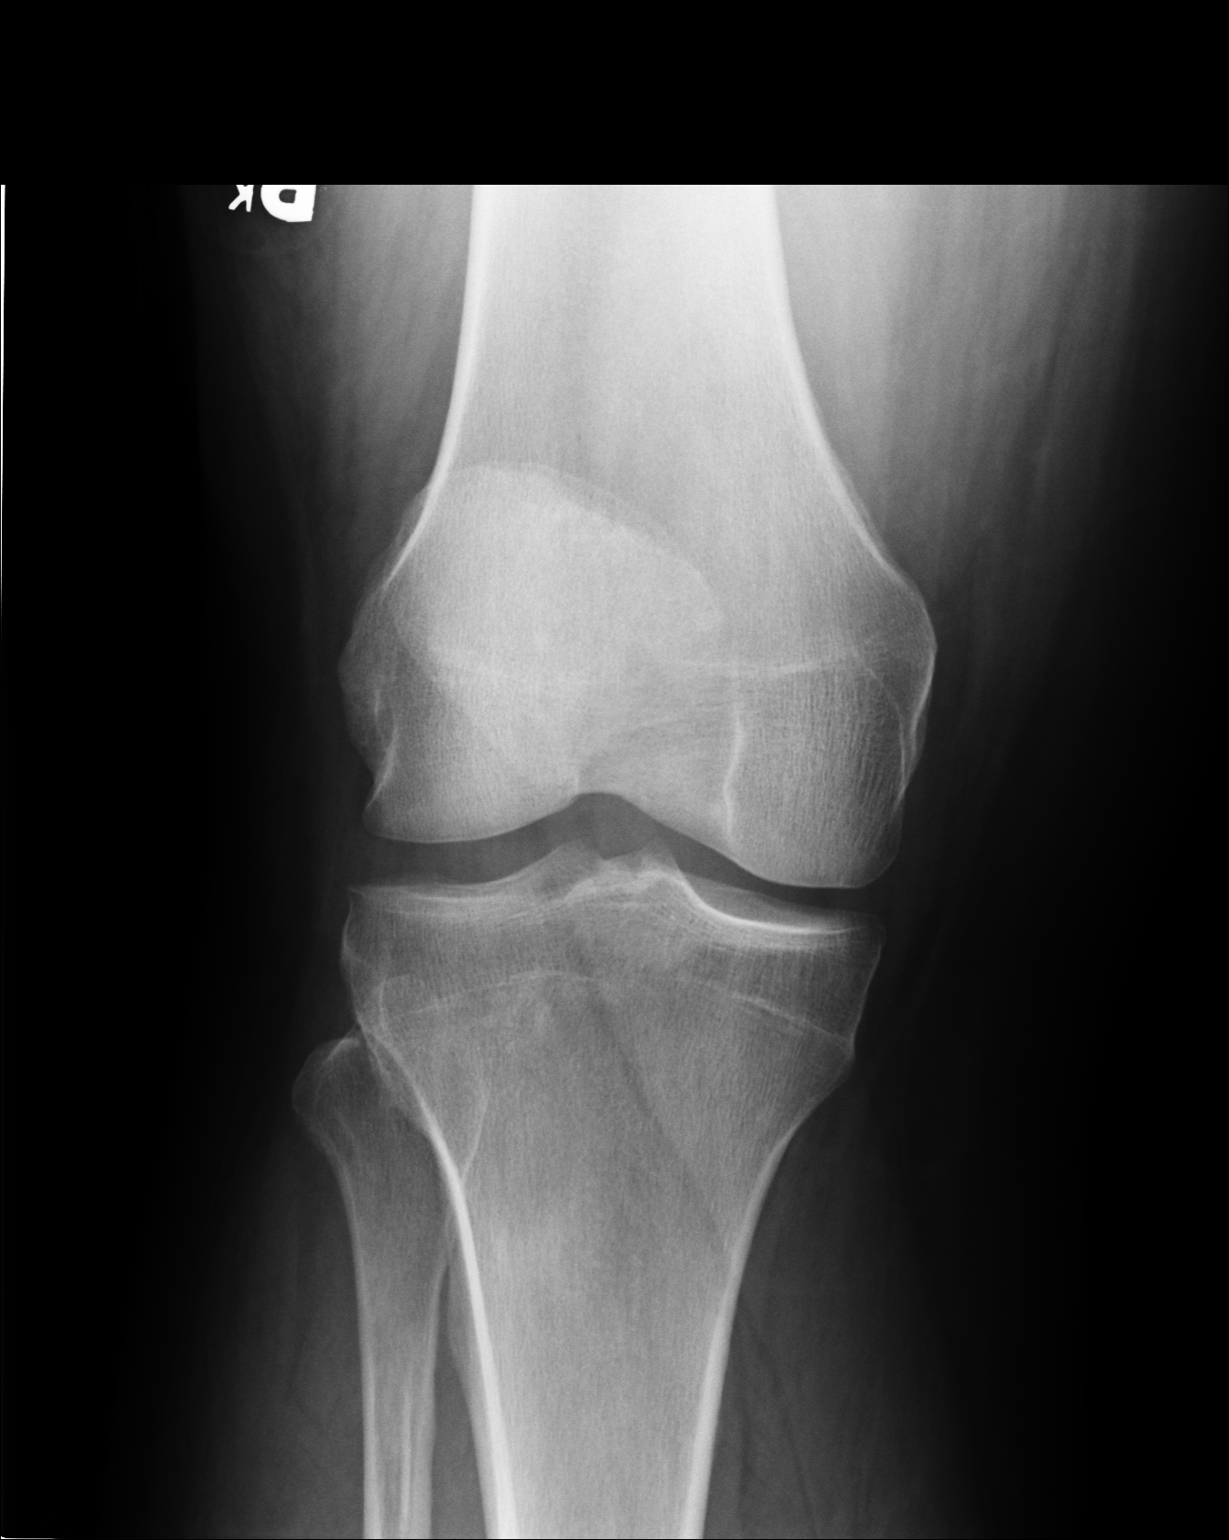
[im 2/3]
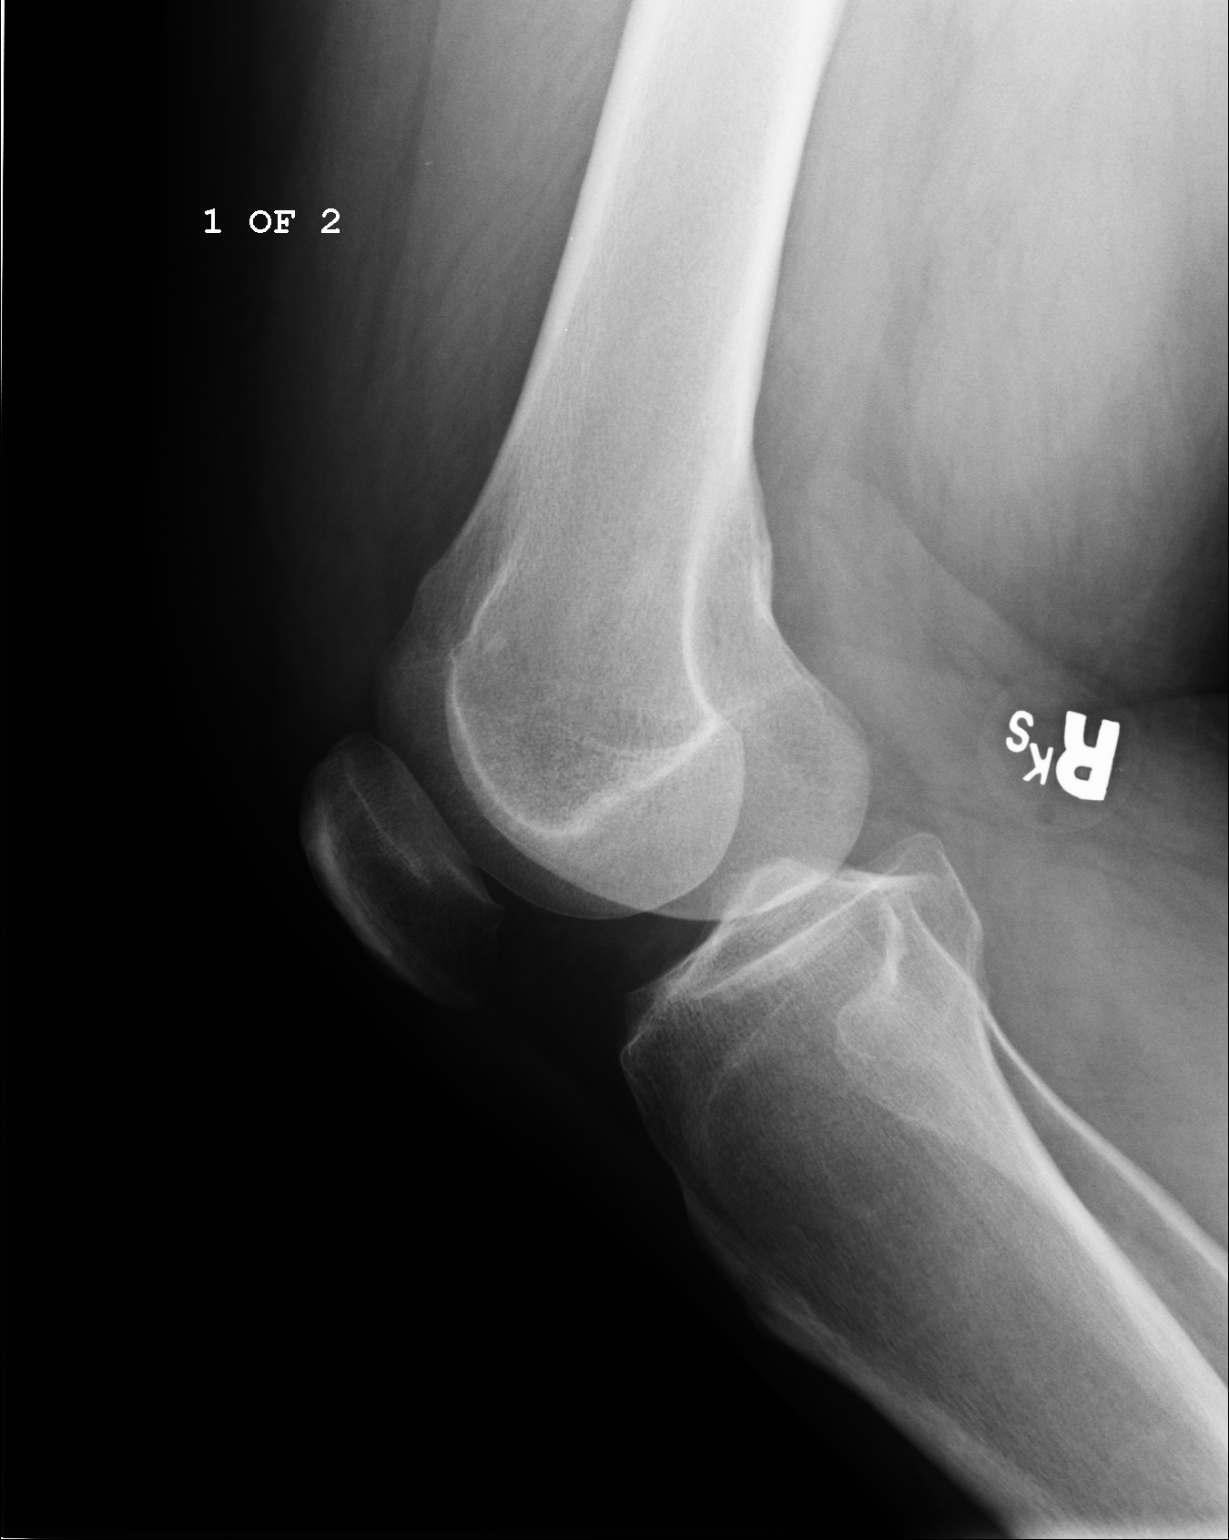
[im 3/3]
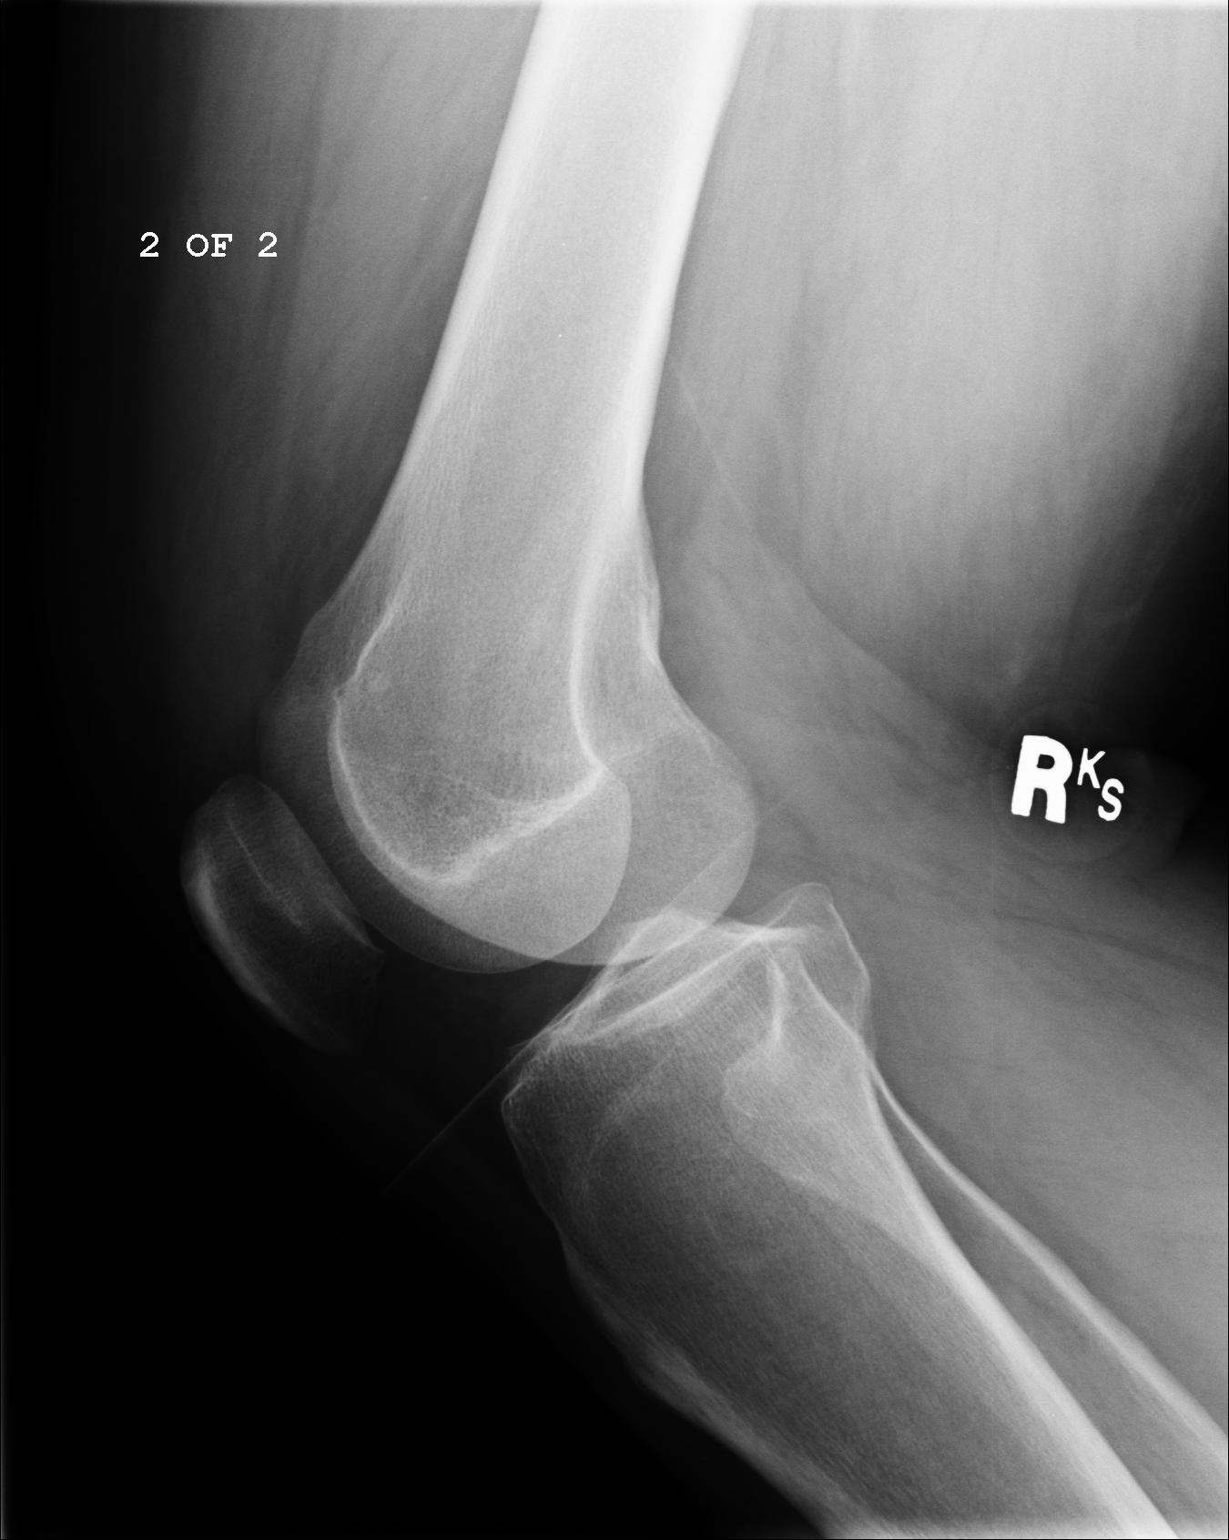

[3 of 3 positions shown; findings below may reference images not displayed]

IMPRESSION: Please see above.

## 2012-02-18 ENCOUNTER — Encounter (HOSPITAL_BASED_OUTPATIENT_CLINIC_OR_DEPARTMENT_OTHER): Payer: Self-pay | Admitting: *Deleted

## 2012-02-18 ENCOUNTER — Emergency Department (HOSPITAL_BASED_OUTPATIENT_CLINIC_OR_DEPARTMENT_OTHER): Payer: Medicare Other

## 2012-02-18 ENCOUNTER — Emergency Department (HOSPITAL_BASED_OUTPATIENT_CLINIC_OR_DEPARTMENT_OTHER)
Admission: EM | Admit: 2012-02-18 | Discharge: 2012-02-19 | Disposition: A | Discharge status: Home / Self Care | Payer: Medicare Other | Attending: Emergency Medicine | Admitting: Emergency Medicine

## 2012-02-18 DIAGNOSIS — M25569 Pain in unspecified knee: Secondary | ICD-10-CM

## 2012-02-18 DIAGNOSIS — J45909 Unspecified asthma, uncomplicated: Secondary | ICD-10-CM | POA: Insufficient documentation

## 2012-02-18 DIAGNOSIS — Z79899 Other long term (current) drug therapy: Secondary | ICD-10-CM | POA: Insufficient documentation

## 2012-02-18 DIAGNOSIS — Y9301 Activity, walking, marching and hiking: Secondary | ICD-10-CM | POA: Insufficient documentation

## 2012-02-18 DIAGNOSIS — E119 Type 2 diabetes mellitus without complications: Secondary | ICD-10-CM | POA: Insufficient documentation

## 2012-02-18 DIAGNOSIS — I10 Essential (primary) hypertension: Secondary | ICD-10-CM | POA: Insufficient documentation

## 2012-02-18 DIAGNOSIS — IMO0002 Reserved for concepts with insufficient information to code with codable children: Secondary | ICD-10-CM | POA: Insufficient documentation

## 2012-02-18 DIAGNOSIS — W19XXXA Unspecified fall, initial encounter: Secondary | ICD-10-CM

## 2012-02-18 DIAGNOSIS — S8990XA Unspecified injury of unspecified lower leg, initial encounter: Secondary | ICD-10-CM | POA: Insufficient documentation

## 2012-02-18 DIAGNOSIS — Y929 Unspecified place or not applicable: Secondary | ICD-10-CM | POA: Insufficient documentation

## 2012-02-18 DIAGNOSIS — F909 Attention-deficit hyperactivity disorder, unspecified type: Secondary | ICD-10-CM | POA: Insufficient documentation

## 2012-02-18 DIAGNOSIS — W1809XA Striking against other object with subsequent fall, initial encounter: Secondary | ICD-10-CM | POA: Insufficient documentation

## 2012-02-18 DIAGNOSIS — F319 Bipolar disorder, unspecified: Secondary | ICD-10-CM | POA: Insufficient documentation

## 2012-02-18 DIAGNOSIS — E785 Hyperlipidemia, unspecified: Secondary | ICD-10-CM | POA: Insufficient documentation

## 2012-02-18 DIAGNOSIS — F17200 Nicotine dependence, unspecified, uncomplicated: Secondary | ICD-10-CM | POA: Insufficient documentation

## 2012-02-18 HISTORY — DX: Morbid (severe) obesity due to excess calories: E66.01

## 2012-02-18 MED ORDER — FENTANYL CITRATE 0.05 MG/ML IJ SOLN
50.0000 ug | Freq: Once | INTRAMUSCULAR | Status: AC
  Administered 2012-02-19: 50 ug via INTRAMUSCULAR
  Filled 2012-02-18: qty 2

## 2012-02-18 NOTE — ED Notes (Signed)
Right leg gave out and he fell on concrete steps. Back pain and right leg pain. EMS brought him to the ED. He was given Toradol 60mg  IM prior to arrival. He is alert and tearful on arrival.

## 2012-02-19 ENCOUNTER — Emergency Department (HOSPITAL_BASED_OUTPATIENT_CLINIC_OR_DEPARTMENT_OTHER): Payer: Medicare Other

## 2012-02-19 ENCOUNTER — Encounter (HOSPITAL_BASED_OUTPATIENT_CLINIC_OR_DEPARTMENT_OTHER): Payer: Self-pay | Admitting: Emergency Medicine

## 2012-02-19 MED ORDER — METHOCARBAMOL 500 MG PO TABS: 500.0000 mg | ORAL_TABLET | Freq: Two times a day (BID) | ORAL | Status: DC

## 2012-02-19 MED ORDER — METHOCARBAMOL 500 MG PO TABS
1000.0000 mg | ORAL_TABLET | Freq: Once | ORAL | Status: AC
  Administered 2012-02-19: 1000 mg via ORAL
  Filled 2012-02-19: qty 2

## 2012-02-19 MED ORDER — OXYCODONE HCL 5 MG PO CAPS: 5.0000 mg | ORAL_CAPSULE | Freq: Four times a day (QID) | ORAL | Status: DC | PRN

## 2012-02-19 MED ORDER — FENTANYL CITRATE 0.05 MG/ML IJ SOLN
50.0000 ug | Freq: Once | INTRAMUSCULAR | Status: AC
  Administered 2012-02-19: 50 ug via INTRAMUSCULAR
  Filled 2012-02-19: qty 2

## 2012-02-19 NOTE — ED Provider Notes (Signed)
History     CSN: [id number]  Arrival date & time 02/18/12  2309   First MD Initiated Contact with Patient 02/18/12 2338      Chief Complaint  Patient presents with  . Fall    (Consider location/radiation/quality/duration/timing/severity/associated sxs/prior treatment) Patient is a 36 y.o. male presenting with fall. The history is provided by the patient. No language interpreter was used.  Fall The accident occurred 1 to 2 hours ago. The fall occurred while walking. He fell from a height of 1 to 2 ft. He landed on concrete. There was no blood loss. Point of impact: lower back. Pain location: lower back and right knee. The pain is at a severity of 10/10. The pain is severe. He was ambulatory at the scene. There was no entrapment after the fall. There was no drug use involved in the accident. Pertinent negatives include no visual change, no numbness, no abdominal pain, no vomiting and no headaches. The symptoms are aggravated by activity. Treatment on scene includes medications. He has tried NSAIDs for the symptoms. The treatment provided no relief.  Did not strike head no LOC  Past Medical History  Diagnosis Date  . Heart valve disorder   . Hypertension   . Asthma   . Diabetes mellitus   . Hyperlipidemia   . Anaphylactic reaction   . Bipolar disorder   . Adult ADHD     Past Surgical History  Procedure Date  . Carpal tunnel release     No family history on file.  History  Substance Use Topics  . Smoking status: Current Every Day Smoker -- 1.0 packs/day for 23 years    Types: Cigarettes  . Smokeless tobacco: Not on file  . Alcohol Use: No      Review of Systems  Respiratory: Negative for chest tightness and shortness of breath.   Gastrointestinal: Negative for vomiting and abdominal pain.  Neurological: Negative for weakness, numbness and headaches.  All other systems reviewed and are negative.    Allergies  Atenolol; Other; and Tylenol  Home Medications    Current Outpatient Rx  Name  Route  Sig  Dispense  Refill  . AMPHETAMINE-DEXTROAMPHETAMINE 30 MG PO TABS   Oral   Take 30 mg by mouth 2 (two) times daily.         Marland Kitchen CLONIDINE HCL 0.2 MG PO TABS   Oral   Take 0.2 mg by mouth daily.         Marland Kitchen DIAZEPAM 5 MG PO TABS   Oral   Take 5 mg by mouth every 6 (six) hours as needed.         Marland Kitchen FLUTICASONE-SALMETEROL 250-50 MCG/DOSE IN AEPB   Inhalation   Inhale 1 puff into the lungs every 12 (twelve) hours.         Marland Kitchen FOLIC ACID 1 MG PO TABS   Oral   Take 1 mg by mouth daily.         Marland Kitchen LISINOPRIL 10 MG PO TABS   Oral   Take 20 mg by mouth daily.          . OXYCODONE HCL 30 MG PO TABS   Oral   Take 30 mg by mouth every 4 (four) hours as needed. For pain.         Marland Kitchen SITAGLIPTIN-METFORMIN HCL 50-500 MG PO TABS   Oral   Take 1 tablet by mouth 2 (two) times daily with a meal.         .  SUCRALFATE 1 G PO TABS   Oral   Take 1 tablet (1 g total) by mouth 4 (four) times daily.   30 tablet   0   . TESTOSTERONE 50 MG/5GM TD GEL   Transdermal   Place 10 g onto the skin 2 (two) times daily.           BP 160/94  Pulse 109  Temp 97.7 F (36.5 C) (Oral)  Resp 24  SpO2 97%  Physical Exam  Constitutional: He is oriented to person, place, and time. He appears well-developed and well-nourished. No distress.  HENT:  Head: Normocephalic and atraumatic. Head is without raccoon's eyes and without Battle's sign.  Right Ear: No hemotympanum.  Left Ear: No hemotympanum.  Mouth/Throat: Oropharynx is clear and moist.  Eyes: Conjunctivae normal and EOM are normal. Pupils are equal, round, and reactive to light.  Neck: Normal range of motion. Neck supple.  Cardiovascular: Normal rate, regular rhythm and intact distal pulses.   Pulmonary/Chest: Effort normal and breath sounds normal. He has no wheezes. He has no rales.  Abdominal: Soft. Bowel sounds are normal. There is no tenderness.       Pelvis stable  Musculoskeletal:  Normal range of motion. He exhibits no edema.       Negative anterior and posterior drawer test 2+ dorsalis pedis.  Cap refill < 2 sec to toes of B feet  Neuro vascularly intact BLE L5/s1 intact intact perineal sensation  Neurological: He is alert and oriented to person, place, and time. He has normal reflexes.  Skin: Skin is warm and dry.  Psychiatric: He has a normal mood and affect.    ED Course  Procedures (including critical care time)  Labs Reviewed - No data to display Dg Knee Complete 4 Views Right  02/18/2012  *RADIOLOGY REPORT*  Clinical Data: Right knee pain status post fall.  RIGHT KNEE - COMPLETE 4+ VIEW  Comparison: 08/19/2010  Findings: Mild tricompartmental degenerative changes.  Linear lucency through the medial tibial plateau.  No significant displacement. Small joint effusion.  IMPRESSION: Linear lucency through the medial tibial plateau is suspicious for a nondisplaced fracture.  Joint effusion.   Original Report Authenticated By: Jearld Lesch, M.D.      No diagnosis found.    MDM  No tibial plateau tenderness on exam.  Will place in knee immobilizer follow up with orthopedics for ongoing care.  Patient verbalizes understanding and agrees to follow up        Boby Eyer Smitty Cords, MD 02/19/12 [phone]

## 2012-02-19 NOTE — ED Notes (Signed)
Patient states he wants more pain meds, patient stated he waited over four hours since asked for pain meds with no relief.

## 2012-04-07 ENCOUNTER — Encounter (HOSPITAL_COMMUNITY): Payer: Self-pay | Admitting: *Deleted

## 2012-04-07 ENCOUNTER — Other Ambulatory Visit: Payer: Self-pay

## 2012-04-07 ENCOUNTER — Emergency Department (HOSPITAL_COMMUNITY): Payer: Medicare Other

## 2012-04-07 ENCOUNTER — Emergency Department (HOSPITAL_COMMUNITY)
Admission: EM | Admit: 2012-04-07 | Discharge: 2012-04-08 | Disposition: A | Discharge status: Home / Self Care | Payer: Medicare Other | Attending: Emergency Medicine | Admitting: Emergency Medicine

## 2012-04-07 DIAGNOSIS — R000 Tachycardia, unspecified: Secondary | ICD-10-CM | POA: Insufficient documentation

## 2012-04-07 DIAGNOSIS — Z79899 Other long term (current) drug therapy: Secondary | ICD-10-CM | POA: Insufficient documentation

## 2012-04-07 DIAGNOSIS — E785 Hyperlipidemia, unspecified: Secondary | ICD-10-CM | POA: Insufficient documentation

## 2012-04-07 DIAGNOSIS — I951 Orthostatic hypotension: Secondary | ICD-10-CM | POA: Insufficient documentation

## 2012-04-07 DIAGNOSIS — M545 Low back pain, unspecified: Secondary | ICD-10-CM | POA: Insufficient documentation

## 2012-04-07 DIAGNOSIS — IMO0002 Reserved for concepts with insufficient information to code with codable children: Secondary | ICD-10-CM | POA: Insufficient documentation

## 2012-04-07 DIAGNOSIS — R6883 Chills (without fever): Secondary | ICD-10-CM | POA: Insufficient documentation

## 2012-04-07 DIAGNOSIS — S0993XA Unspecified injury of face, initial encounter: Secondary | ICD-10-CM | POA: Insufficient documentation

## 2012-04-07 DIAGNOSIS — R51 Headache: Secondary | ICD-10-CM | POA: Insufficient documentation

## 2012-04-07 DIAGNOSIS — I10 Essential (primary) hypertension: Secondary | ICD-10-CM | POA: Insufficient documentation

## 2012-04-07 DIAGNOSIS — F909 Attention-deficit hyperactivity disorder, unspecified type: Secondary | ICD-10-CM | POA: Insufficient documentation

## 2012-04-07 DIAGNOSIS — E119 Type 2 diabetes mellitus without complications: Secondary | ICD-10-CM | POA: Insufficient documentation

## 2012-04-07 DIAGNOSIS — Y939 Activity, unspecified: Secondary | ICD-10-CM | POA: Insufficient documentation

## 2012-04-07 DIAGNOSIS — J45909 Unspecified asthma, uncomplicated: Secondary | ICD-10-CM | POA: Insufficient documentation

## 2012-04-07 DIAGNOSIS — R112 Nausea with vomiting, unspecified: Secondary | ICD-10-CM | POA: Insufficient documentation

## 2012-04-07 DIAGNOSIS — G8929 Other chronic pain: Secondary | ICD-10-CM | POA: Insufficient documentation

## 2012-04-07 DIAGNOSIS — R55 Syncope and collapse: Secondary | ICD-10-CM

## 2012-04-07 DIAGNOSIS — Z8679 Personal history of other diseases of the circulatory system: Secondary | ICD-10-CM | POA: Insufficient documentation

## 2012-04-07 DIAGNOSIS — W19XXXA Unspecified fall, initial encounter: Secondary | ICD-10-CM | POA: Insufficient documentation

## 2012-04-07 DIAGNOSIS — R630 Anorexia: Secondary | ICD-10-CM | POA: Insufficient documentation

## 2012-04-07 DIAGNOSIS — F319 Bipolar disorder, unspecified: Secondary | ICD-10-CM | POA: Insufficient documentation

## 2012-04-07 DIAGNOSIS — Y929 Unspecified place or not applicable: Secondary | ICD-10-CM | POA: Insufficient documentation

## 2012-04-07 DIAGNOSIS — F17200 Nicotine dependence, unspecified, uncomplicated: Secondary | ICD-10-CM | POA: Insufficient documentation

## 2012-04-07 LAB — URINE MICROSCOPIC-ADD ON

## 2012-04-07 LAB — URINALYSIS, ROUTINE W REFLEX MICROSCOPIC
Glucose, UA: NEGATIVE mg/dL
Hgb urine dipstick: NEGATIVE
Nitrite: NEGATIVE
Protein, ur: 30 mg/dL — AB
Specific Gravity, Urine: 1.025 (ref 1.005–1.030)
Urobilinogen, UA: 1 mg/dL (ref 0.0–1.0)
pH: 5.5 (ref 5.0–8.0)

## 2012-04-07 LAB — POCT I-STAT, CHEM 8
BUN: 13 mg/dL (ref 6–23)
Calcium, Ion: 1.01 mmol/L — ABNORMAL LOW (ref 1.12–1.23)
Chloride: 103 mEq/L (ref 96–112)
Creatinine, Ser: 1.2 mg/dL (ref 0.50–1.35)
Glucose, Bld: 157 mg/dL — ABNORMAL HIGH (ref 70–99)
HCT: 50 % (ref 39.0–52.0)

## 2012-04-07 LAB — RAPID URINE DRUG SCREEN, HOSP PERFORMED
Barbiturates: NOT DETECTED
Benzodiazepines: POSITIVE — AB
Cocaine: NOT DETECTED

## 2012-04-07 MED ORDER — SODIUM CHLORIDE 0.9 % IV BOLUS (SEPSIS)
1000.0000 mL | Freq: Once | INTRAVENOUS | Status: AC
  Administered 2012-04-07: 1000 mL via INTRAVENOUS

## 2012-04-07 MED ORDER — HYDROMORPHONE HCL PF 1 MG/ML IJ SOLN
1.0000 mg | Freq: Once | INTRAMUSCULAR | Status: AC
  Administered 2012-04-07: 1 mg via INTRAVENOUS
  Filled 2012-04-07: qty 1

## 2012-04-07 MED ORDER — ONDANSETRON HCL 4 MG PO TABS: 4.0000 mg | ORAL_TABLET | Freq: Three times a day (TID) | ORAL | Status: DC | PRN

## 2012-04-07 MED ORDER — MORPHINE SULFATE 2 MG/ML IJ SOLN
2.0000 mg | Freq: Once | INTRAMUSCULAR | Status: AC
  Administered 2012-04-07: 2 mg via INTRAVENOUS
  Filled 2012-04-07: qty 1

## 2012-04-07 MED ORDER — PROMETHAZINE HCL 25 MG/ML IJ SOLN
25.0000 mg | Freq: Once | INTRAMUSCULAR | Status: AC
  Administered 2012-04-07: 25 mg via INTRAVENOUS
  Filled 2012-04-07: qty 1

## 2012-04-07 MED ORDER — SODIUM CHLORIDE 0.9 % IV BOLUS (SEPSIS)
500.0000 mL | Freq: Once | INTRAVENOUS | Status: AC
  Administered 2012-04-07: 500 mL via INTRAVENOUS

## 2012-04-07 MED ORDER — ONDANSETRON HCL 4 MG/2ML IJ SOLN
4.0000 mg | Freq: Once | INTRAMUSCULAR | Status: AC
  Administered 2012-04-07: 4 mg via INTRAVENOUS
  Filled 2012-04-07: qty 2

## 2012-04-07 NOTE — ED Notes (Signed)
Per EMS: pt had 2 syncopal episodes this evening, called EMS after second due to back pain.Per EMS on their arrival pt was ambulatory, c/o dizziness, pt with orthostatic hypotension per EMS report: b/p 111/70 sitting, 82/60 standing. 12 lead shows ST

## 2012-04-07 NOTE — ED Notes (Signed)
Pt reports feeling sick for the past two days. Pt sts nausea and vomiting, sts "unable to keep anything down". Pt also reports mid back pain, sts hx of same.
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
## 2012-04-08 NOTE — ED Provider Notes (Signed)
I saw and evaluated the patient, reviewed the resident's note and I agree with the findings and plan. I have reviewed EKG and agree with the resident interpretation.  you 10 point ROS reviewed and all neg except what was described above. Pt with hx of multiple visits in the past for abd pain/n/v for the last 2 years with recurrent CT's and [place] all which are wnl.  Has seen GI in the past but not recently who presents today after 2 episodes of N/v and abd pain which is not unusual for him.  He today had 2 syncope episodes most suggestive of orthostasis due to dehydration and still taking clonidine and lisinopril.  Tachy on arrival here which improved with fluids to <100.  EKG without signs of QT prolongation, brugada, WPW or other dysrrythmia. Pt was able to ambulate without difficulty other than c/o of his chronic back pain.  Pt is repeatedly asking for pain meds but was given morphine and dilaudid.  Pt states he takes 30mg  of oxycodone at home.  Pt was positive for opiates and benzos on drug screen.  Pt given no other pain meds.   Gwyneth Sprout, MD 04/08/12 [phone]
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
## 2012-05-25 IMAGING — CT CT HEAD W/O CM
1 series · 16 of 30 positions shown, 20 images · non-contrast
Comparison: 08/19/2010

CLINICAL DATA: Chest pain, left-sided numbness

CT HEAD WITHOUT CONTRAST
TECHNIQUE: Contiguous axial images were obtained from the base of
the skull through the vertex without contrast.

[Series 2: head_seq 4.5 h37s st · axial · 0.48mm/px · z∈[-103,+45]mm · 16 of 36 slices shown, 20 images]
[im 2/36  brain]
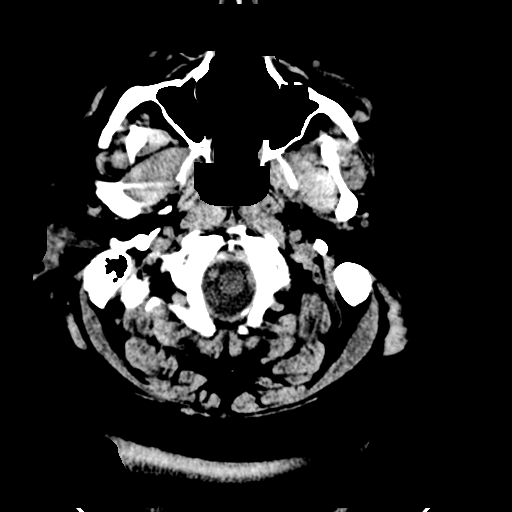
[im 2/36  bone]
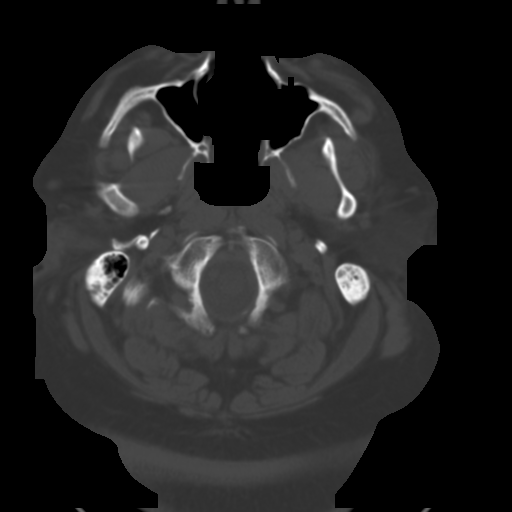
[im 4/36  brain]
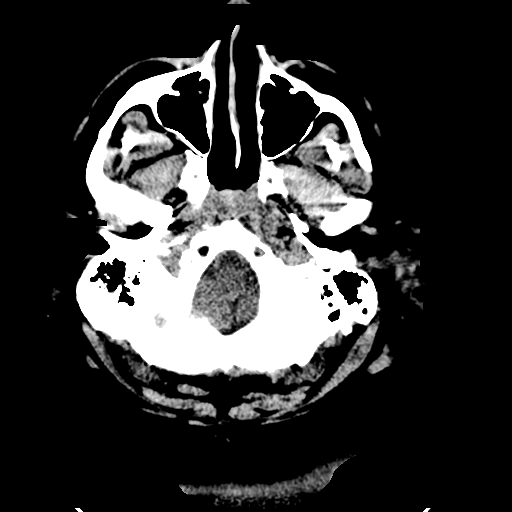
[im 7/36  brain]
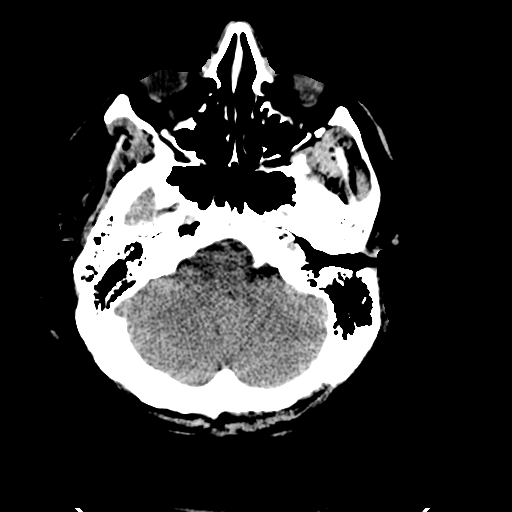
[im 9/36  brain]
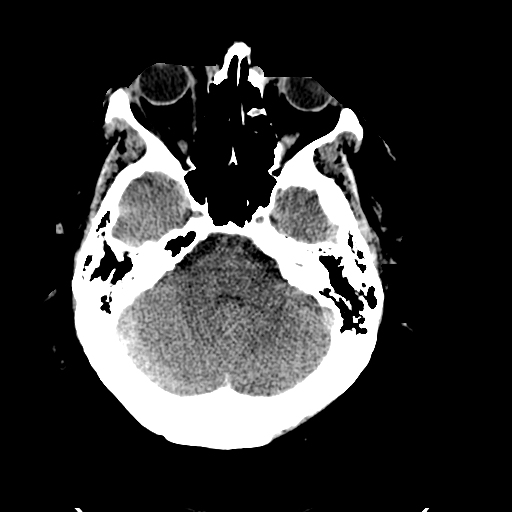
[im 10/36  brain]
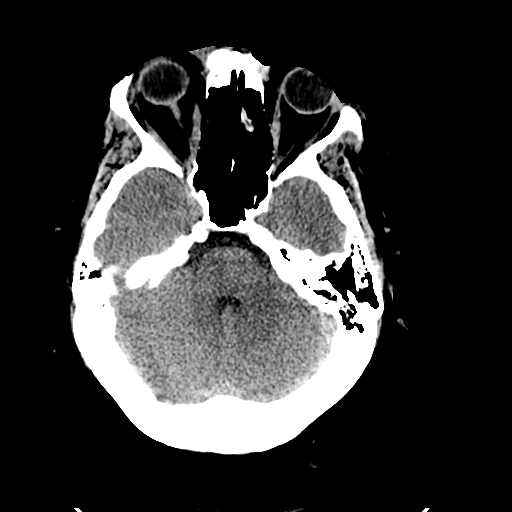
[im 10/36  bone]
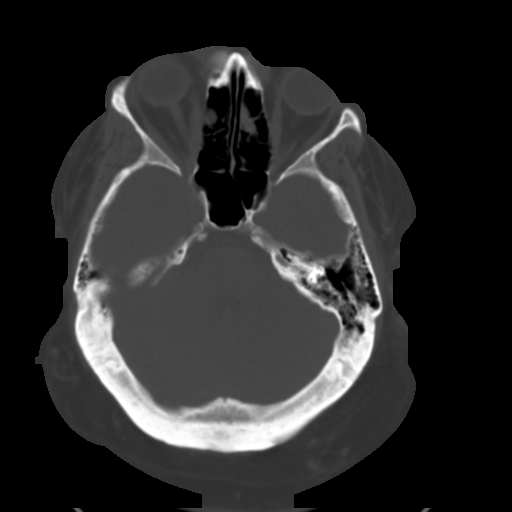
[im 13/36  brain]
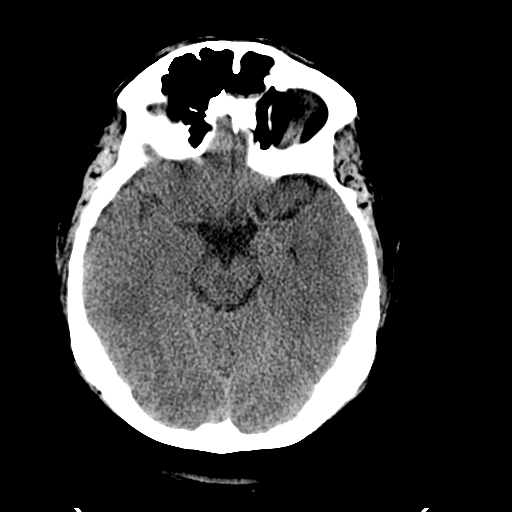
[im 15/36  brain]
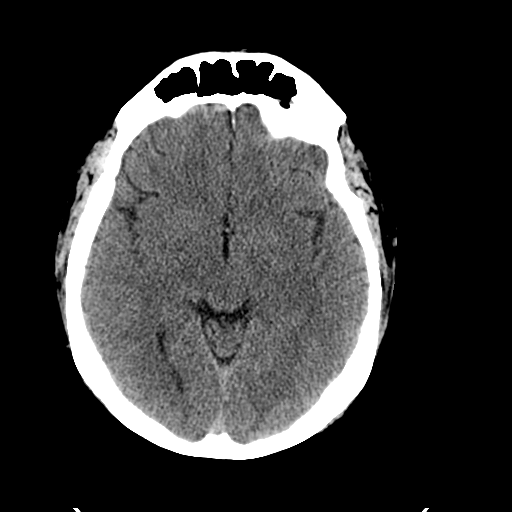
[im 17/36  brain]
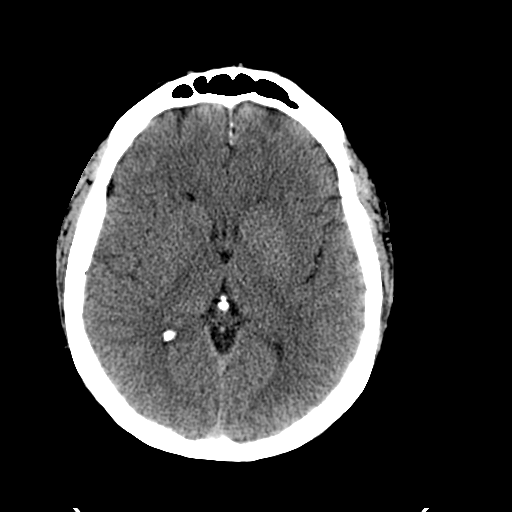
[im 19/36  brain]
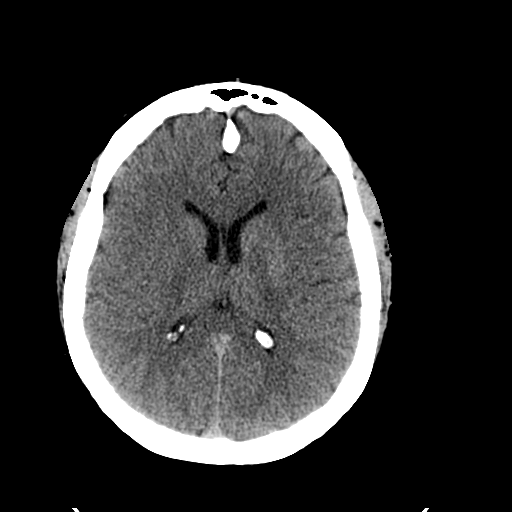
[im 19/36  bone]
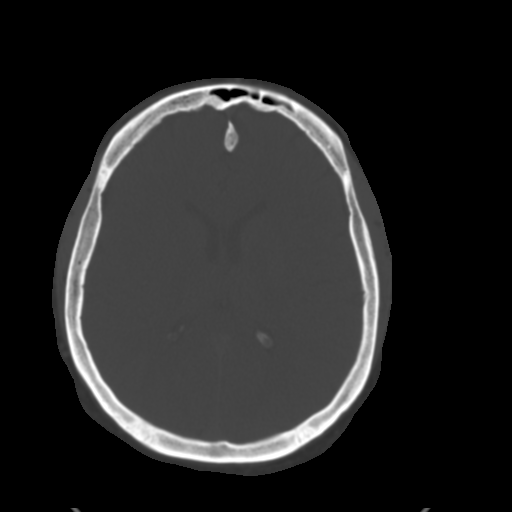
[im 21/36  brain]
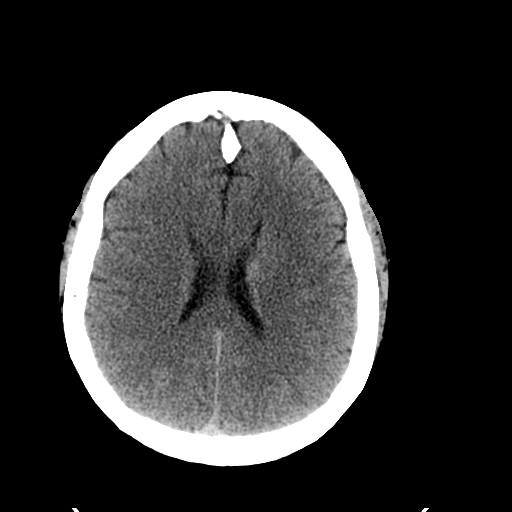
[im 23/36  brain]
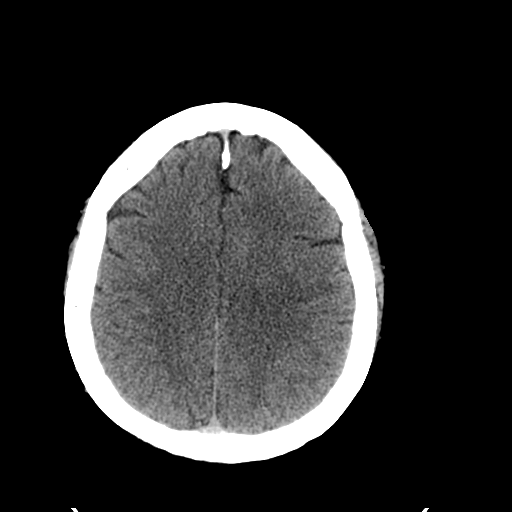
[im 26/36  brain]
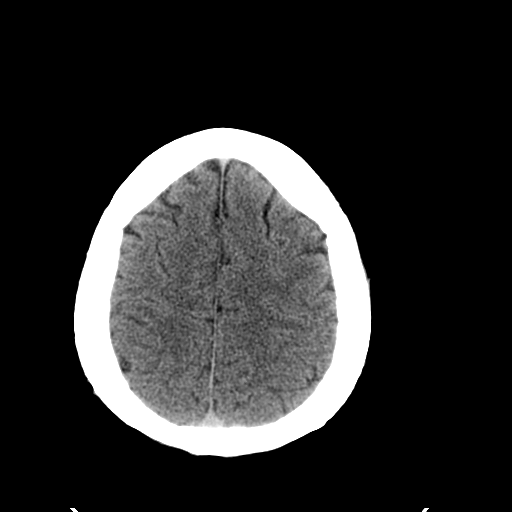
[im 27/36  brain]
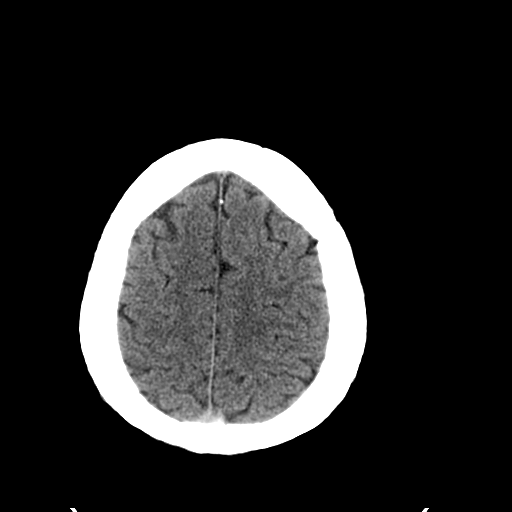
[im 27/36  bone]
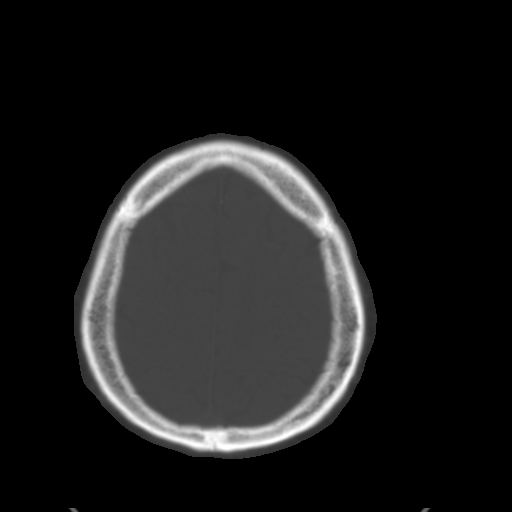
[im 29/36  brain]
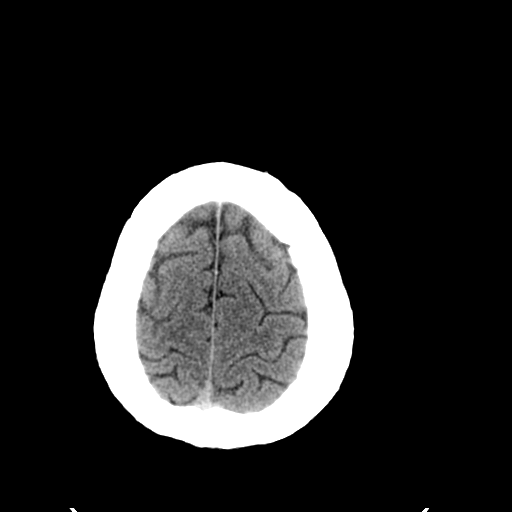
[im 32/36  brain]
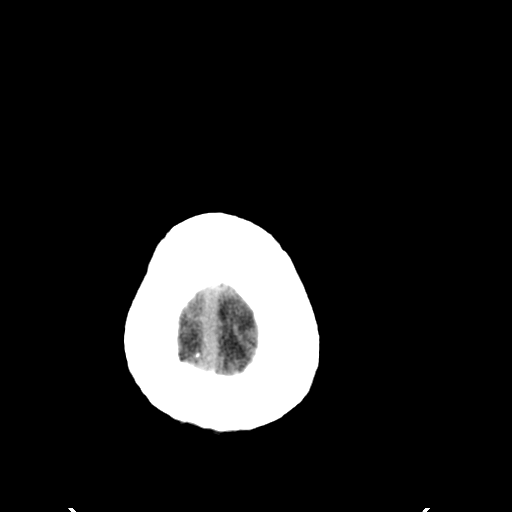
[im 34/36  brain]
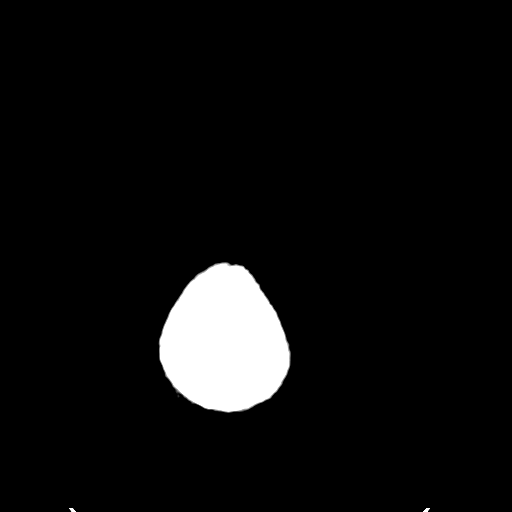

[16 of 30 positions shown; findings below may reference images not displayed]

FINDINGS: No acute intracranial abnormality.  Specifically, no
hemorrhage, hydrocephalus, mass lesion, acute infarction, or
significant intracranial injury.  No acute calvarial abnormality.

Mucoperiosteal thickening in the left frontal sinus and scattered
ethmoid air cells.
IMPRESSION: No acute intracranial abnormality.

## 2012-06-21 IMAGING — CR DG ABDOMEN ACUTE W/ 1V CHEST
7 of 8 series · 7 of 8 positions shown · non-contrast
Comparison: CT 02/26/2010

CLINICAL DATA: Medical clearance, lower abdominal pain, nausea,
vomiting.

ACUTE ABDOMEN SERIES (ABDOMEN 2 VIEW & CHEST 1 VIEW)

[w chest pa * (1 of 2)]
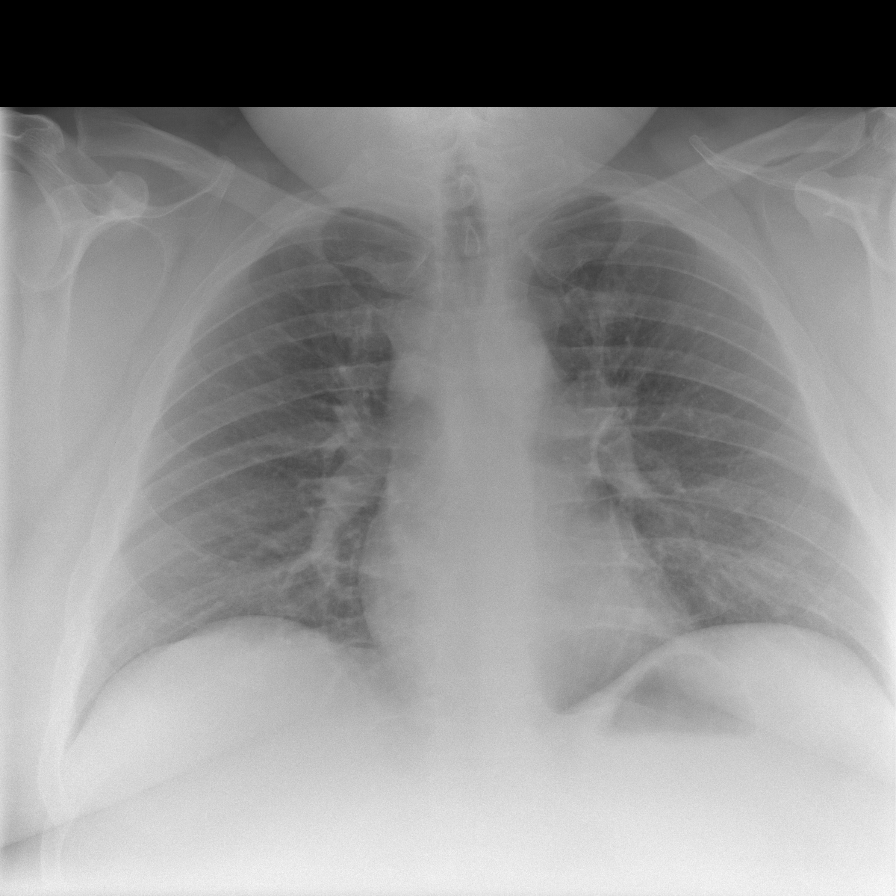

[w chest pa * (2 of 2)]
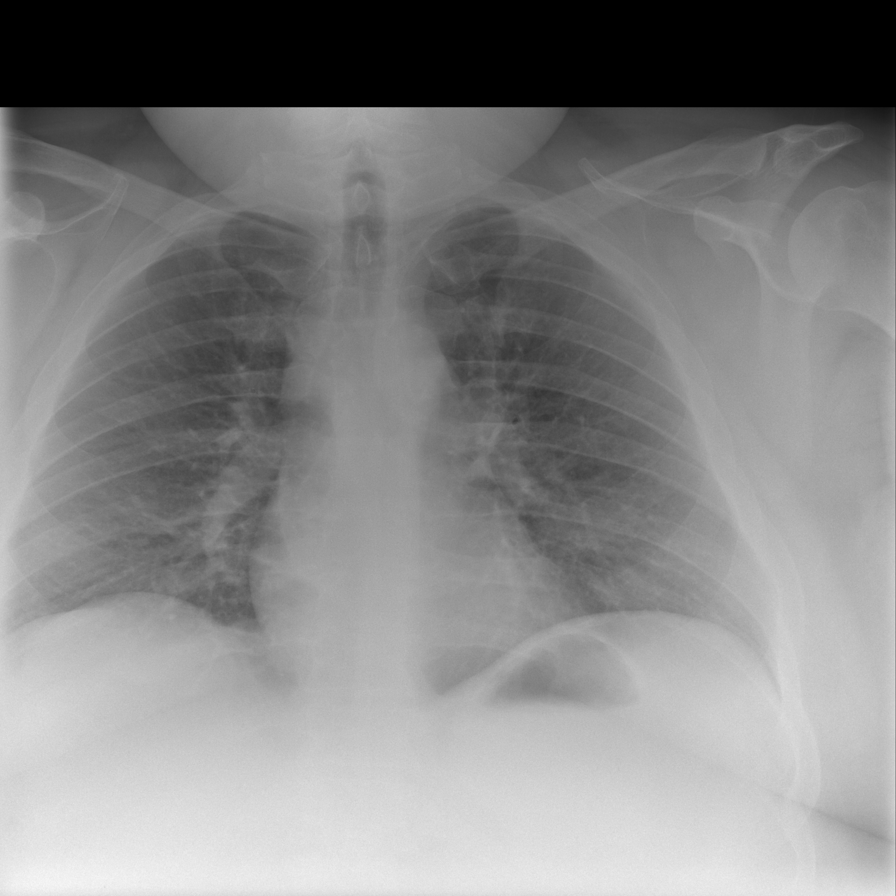

[w abdomen upright * (1 of 2)]
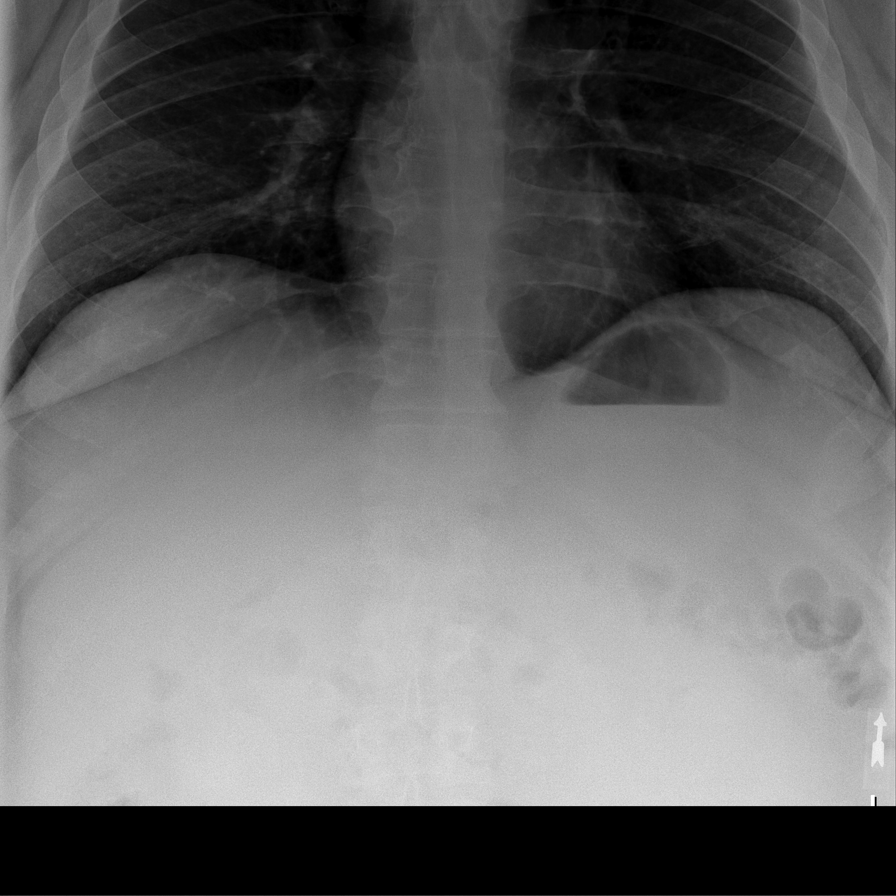

[w abdomen upright * (2 of 2)]
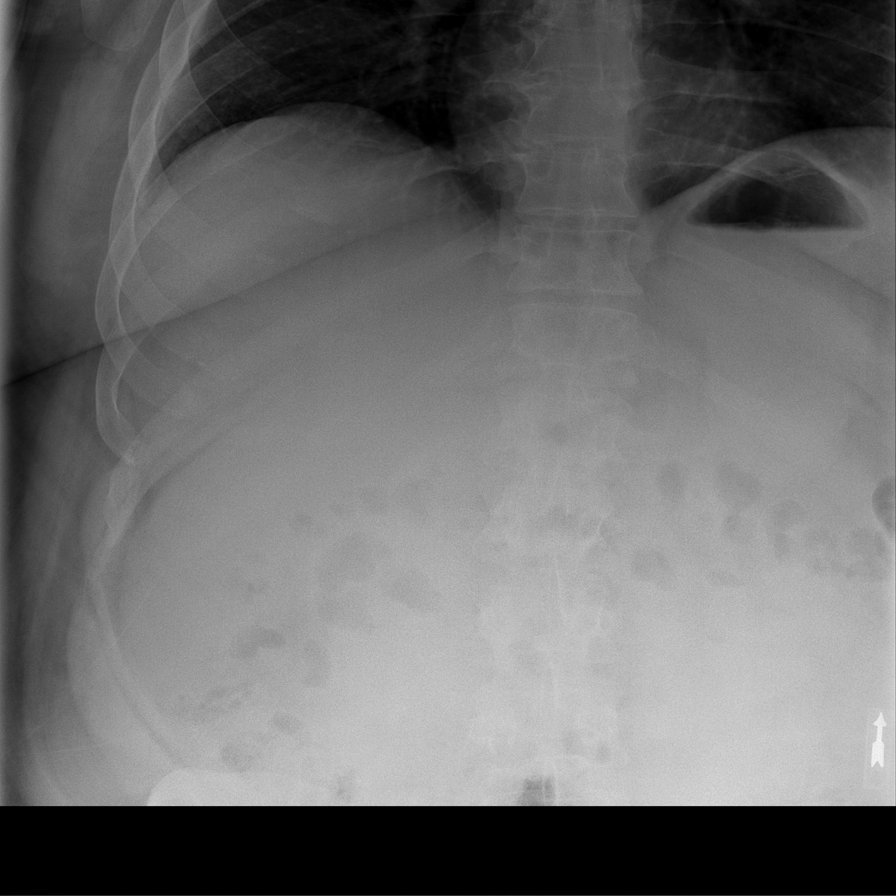

[t abdomen supine * (1 of 3)]
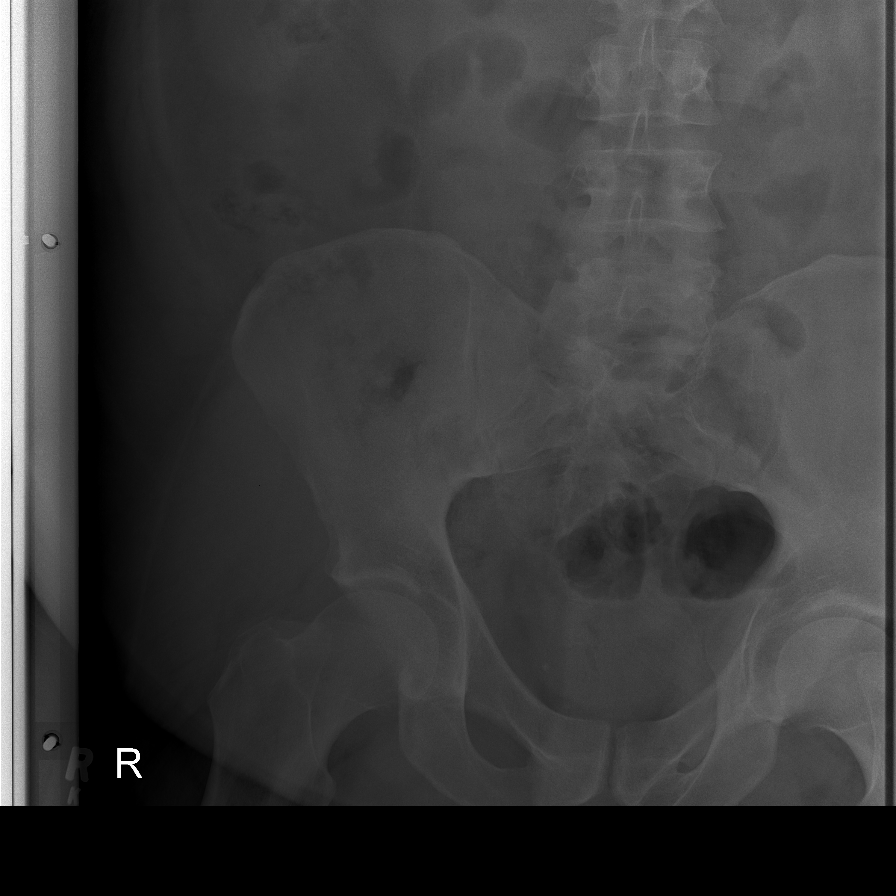

[t abdomen supine * (2 of 3)]
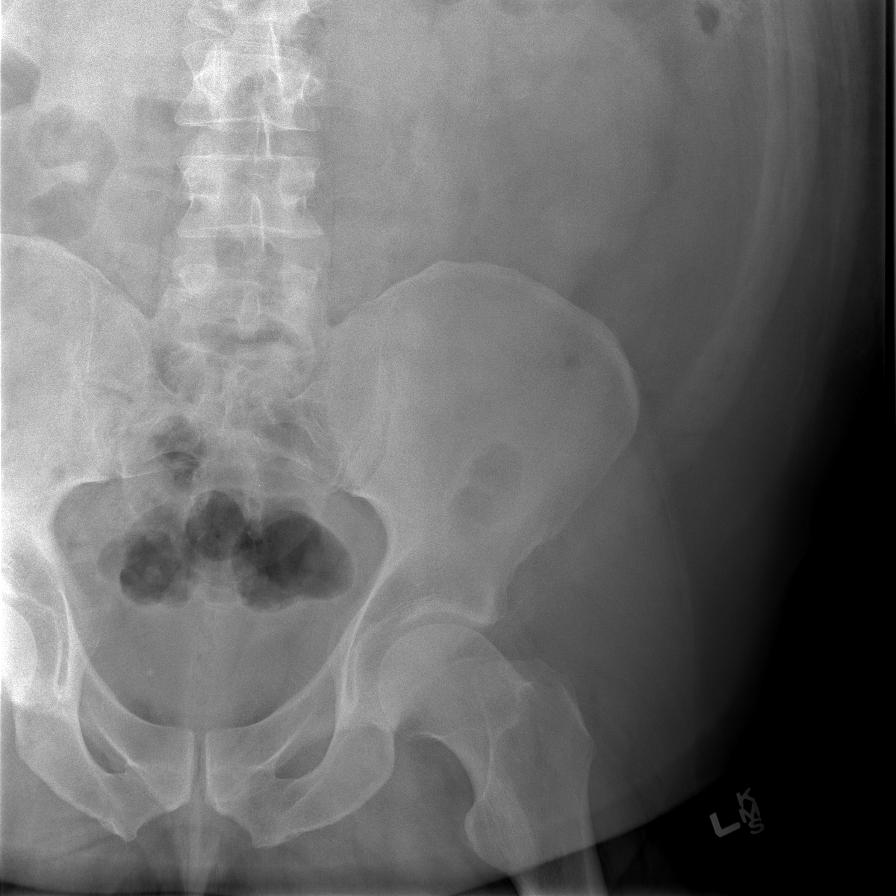

[t abdomen supine * (3 of 3)]
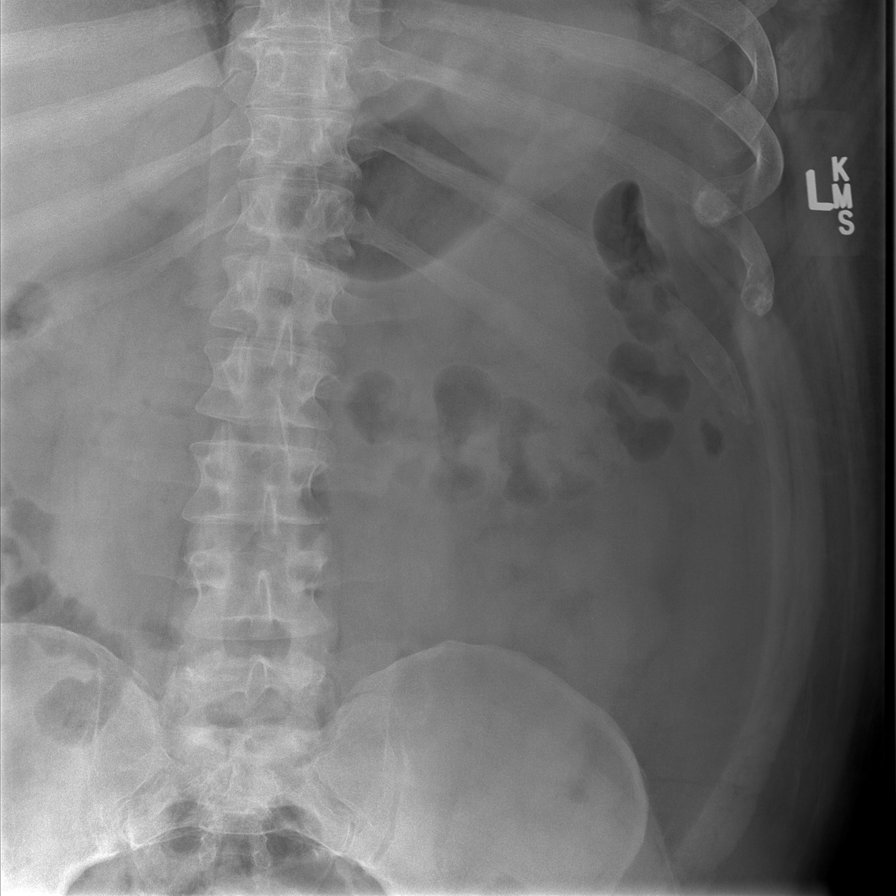

[7 of 8 positions shown; findings below may reference images not displayed]

FINDINGS: The bowel gas pattern is normal.  There is no evidence of
free intraperitoneal air.  No suspicious radio-opaque calculi or
other significant radiographic abnormality is seen. Heart size and
mediastinal contours are within normal limits.  Both lungs are
clear.
IMPRESSION: Unremarkable study.

## 2012-09-06 IMAGING — CT CT ABD-PELV W/ CM
1 of 2 series · 15 of 32 positions shown, 19 images · IV contrast (APPLIED)
Comparison: 02/26/2010

CLINICAL DATA: Worsening abdominal pain.  Diabetes and
hypertension.

CT ABDOMEN AND PELVIS WITH CONTRAST
TECHNIQUE: Multidetector CT imaging of the abdomen and pelvis was
performed following the standard protocol during bolus
administration of intravenous contrast.
Contrast: 125mL OMNIPAQUE IOHEXOL 300 MG/ML IV SOLN

[Series 2: abd/pel with · axial · 0.98mm/px · z∈[+840,+1350]mm · 15 of 112 slices shown, 19 images]
[im 5/112  soft-tissue]
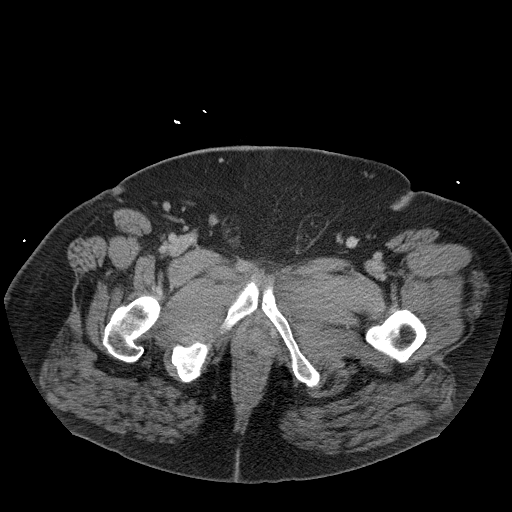
[im 5/112  bone]
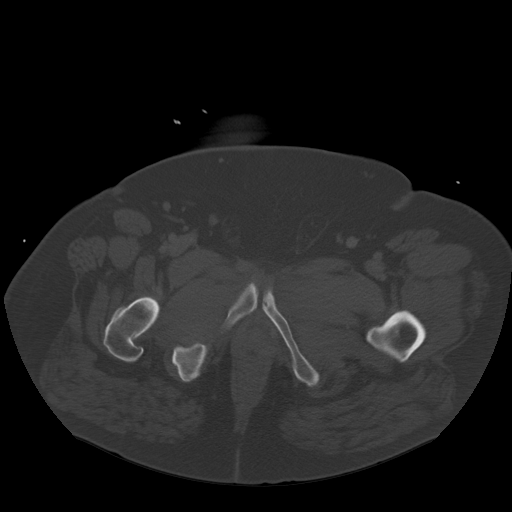
[im 14/112  soft-tissue]
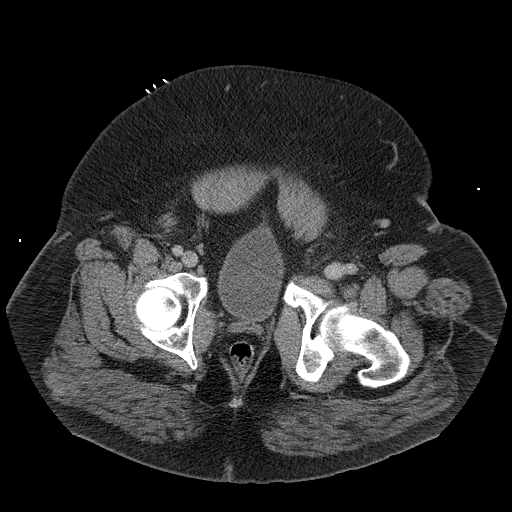
[im 23/112  soft-tissue]
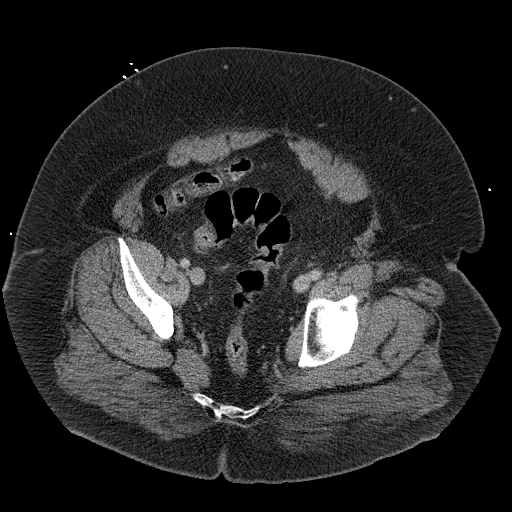
[im 32/112  soft-tissue]
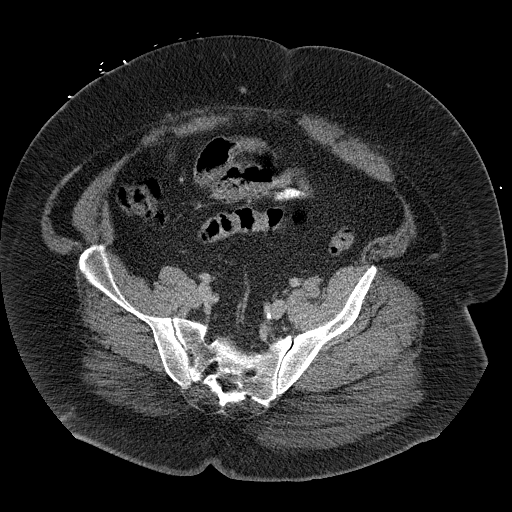
[im 40/112  soft-tissue]
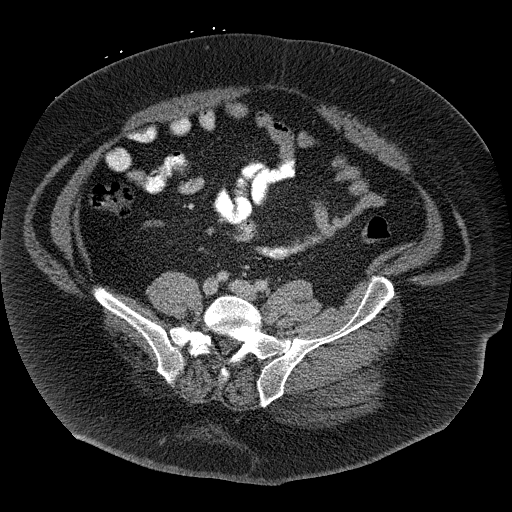
[im 49/112  soft-tissue]
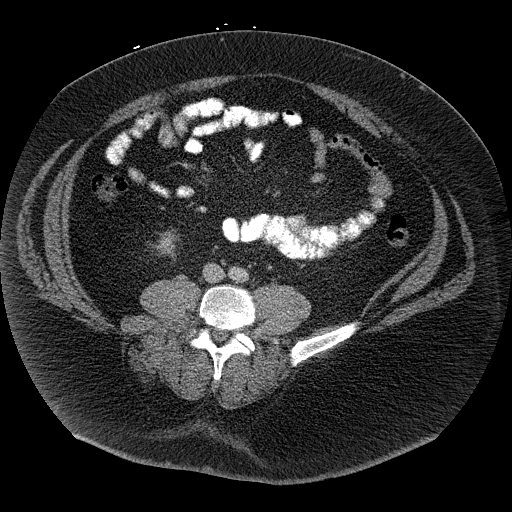
[im 58/112  soft-tissue]
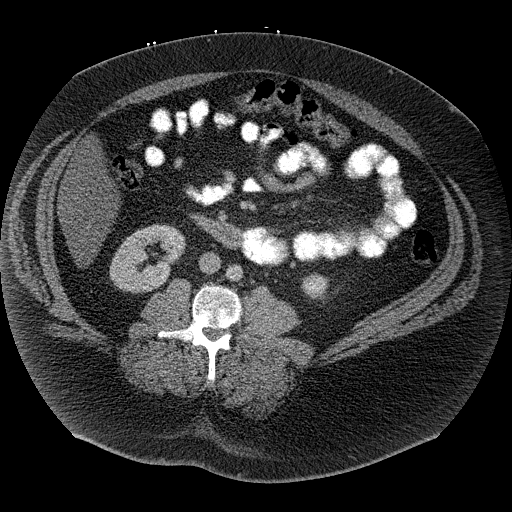
[im 63/112  soft-tissue]
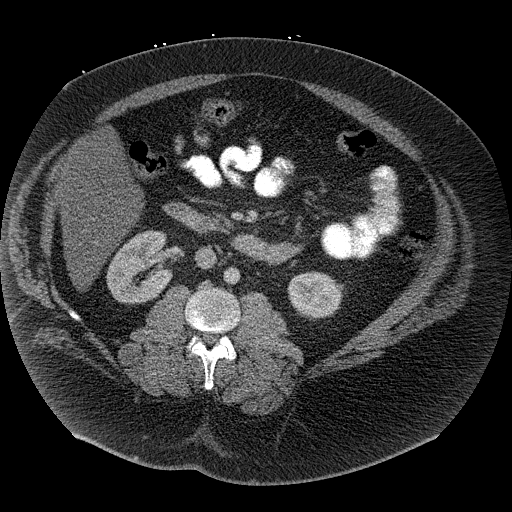
[im 72/112  soft-tissue]
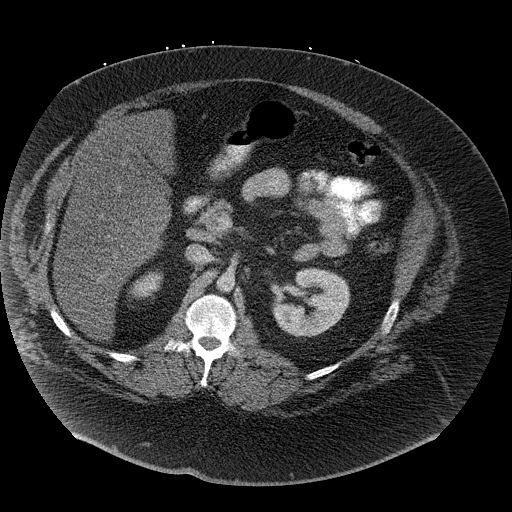
[im 72/112  bone]
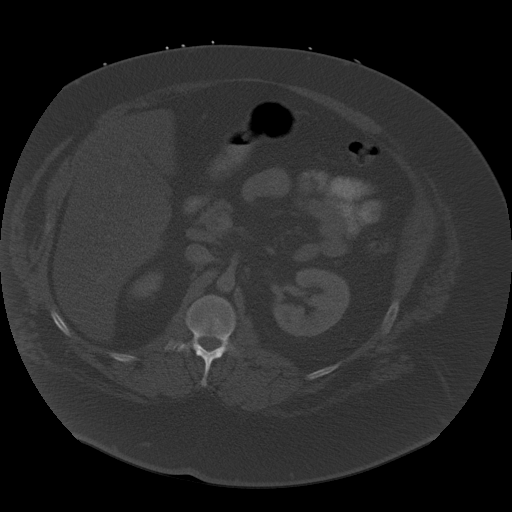
[im 80/112  soft-tissue]
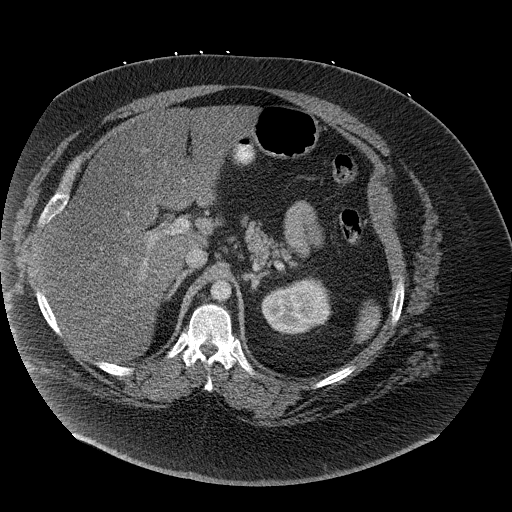
[im 89/112  soft-tissue]
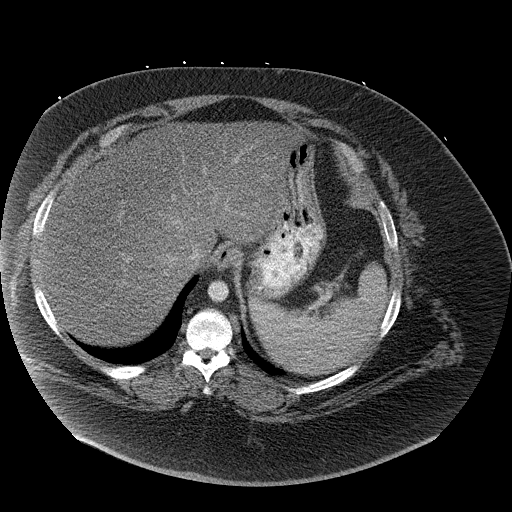
[im 94/112  lung]
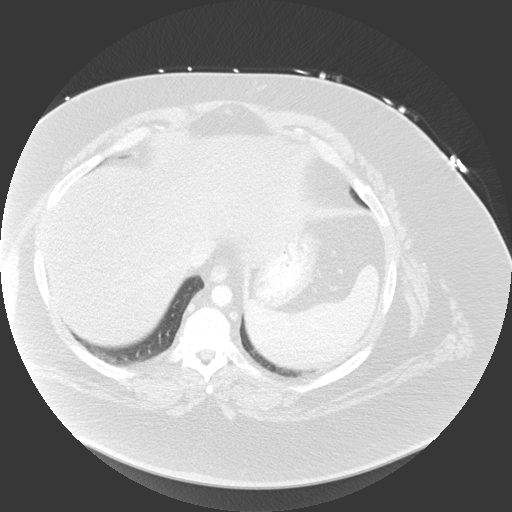
[im 98/112  soft-tissue]
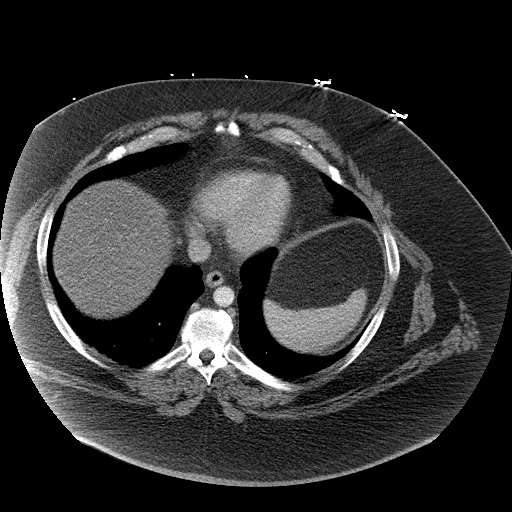
[im 98/112  lung]
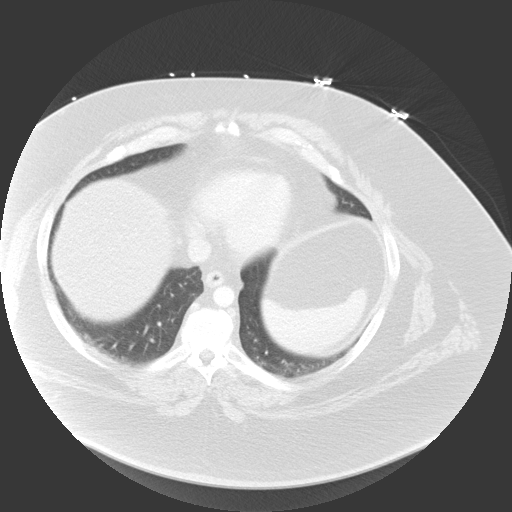
[im 103/112  lung]
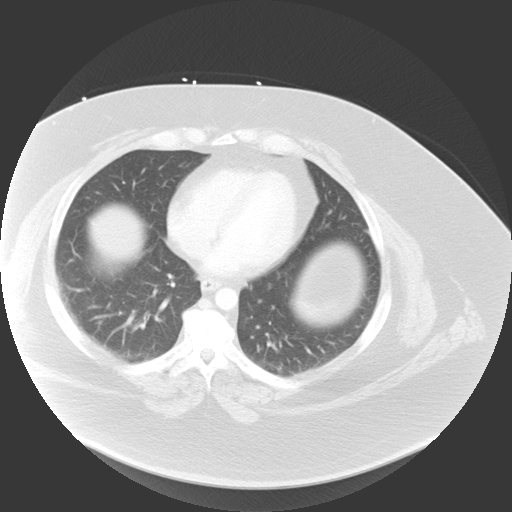
[im 107/112  soft-tissue]
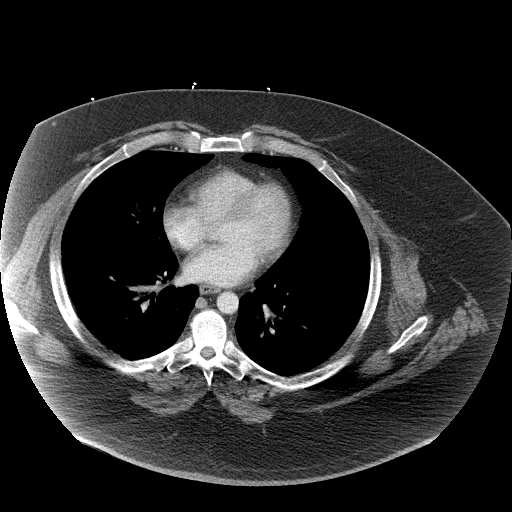
[im 107/112  lung]
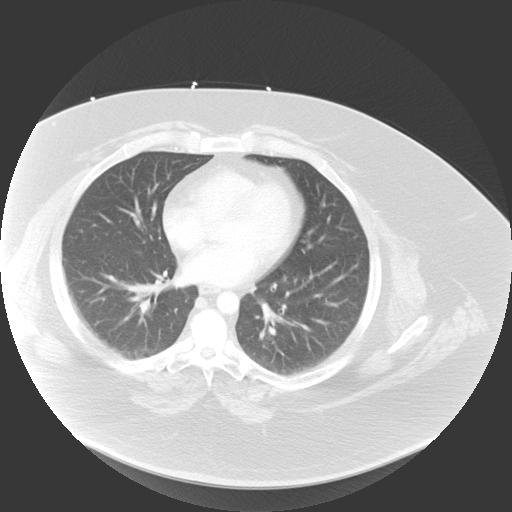

[15 of 32 positions shown; findings below may reference images not displayed]

FINDINGS: Lung bases are clear.  No pleural or pericardial fluid.
The liver shows fatty change but no focal lesion.  No calcified
gallstones.  No biliary ductal dilatation.  The spleen is normal.
The pancreas is normal.  The adrenal glands are normal.  The
kidneys are normal.  No cyst, mass, stone or hydronephrosis.  The
aorta and IVC are normal.  No retroperitoneal mass or adenopathy.
No free intraperitoneal fluid or air.  Bladder, prostate gland and
seminal vesicles are normal.  No bowel pathology is seen.  No
significant bony finding.
IMPRESSION: Fatty change of the liver.  No other finding of significance.

## 2012-09-11 IMAGING — CR DG CHEST 1V PORT
2 series · 2 of 2 positions shown · non-contrast
Comparison: 07/03/2010

CLINICAL DATA: Shortness of breath, asthma.

PORTABLE CHEST - 1 VIEW

[AP (1 of 2)]
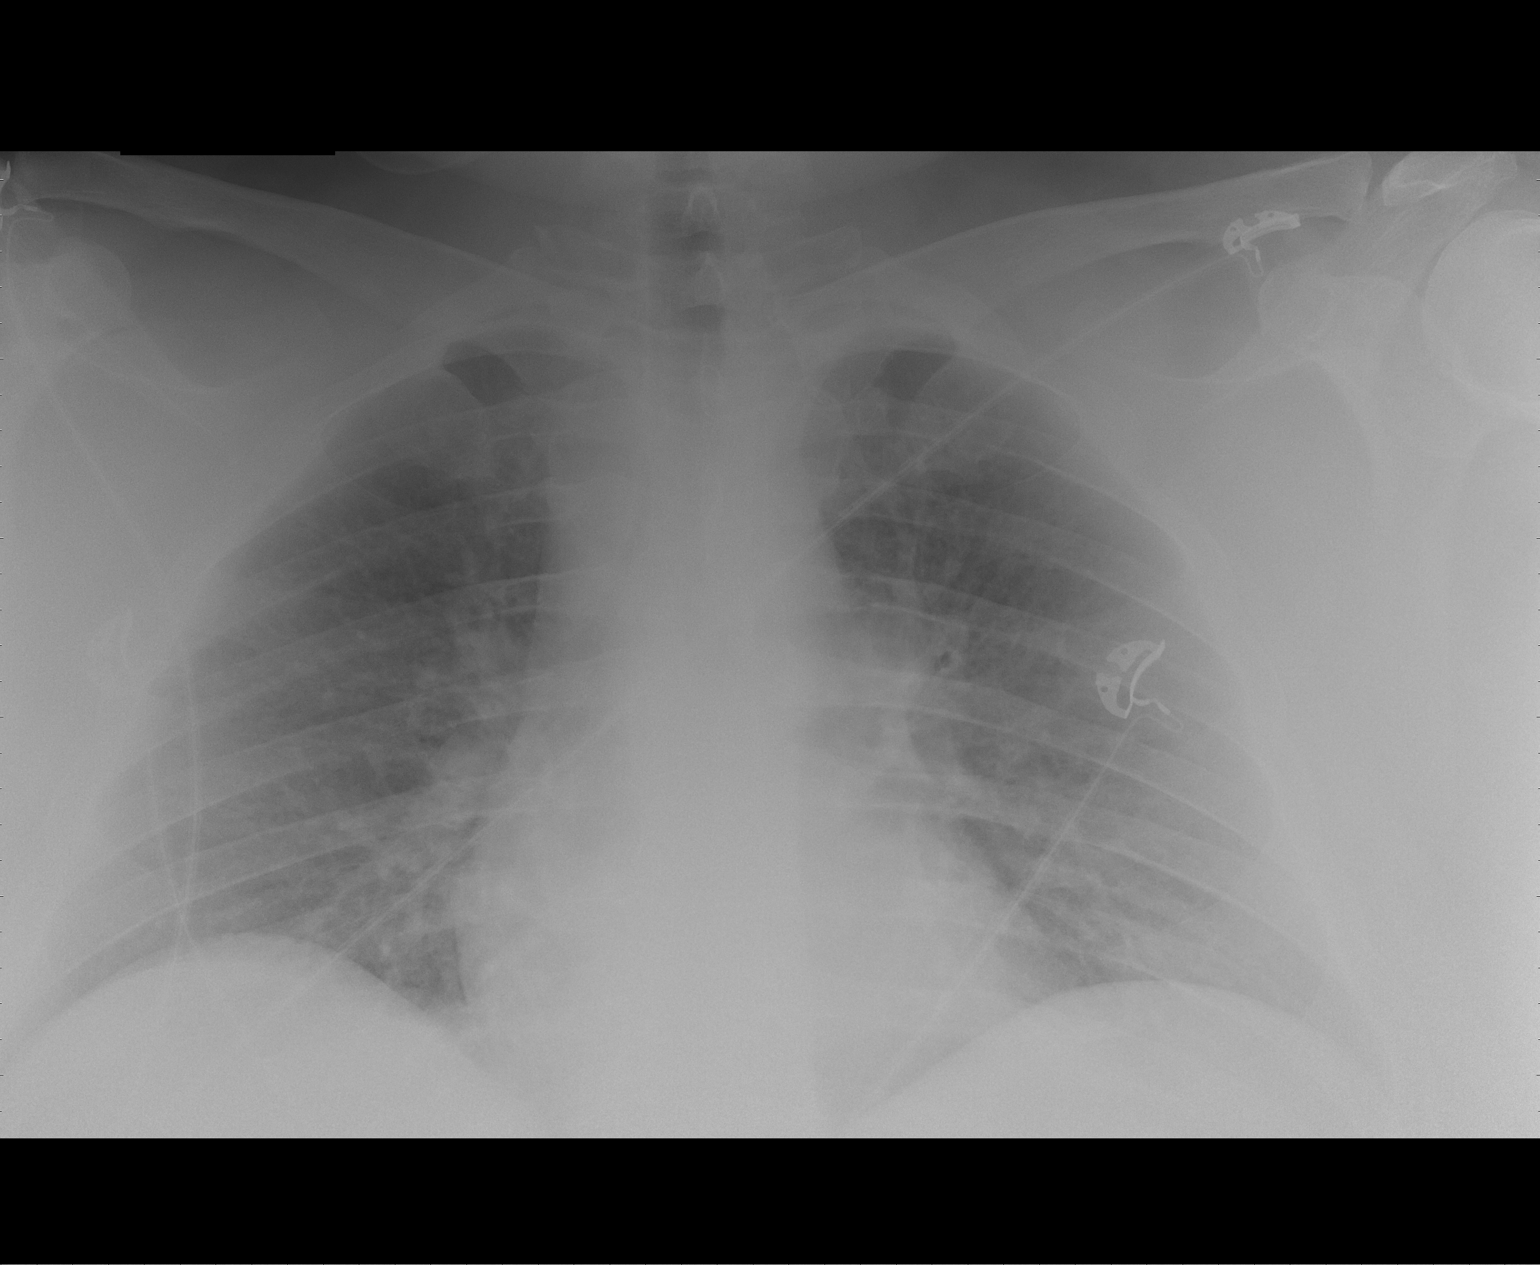

[AP (2 of 2)]
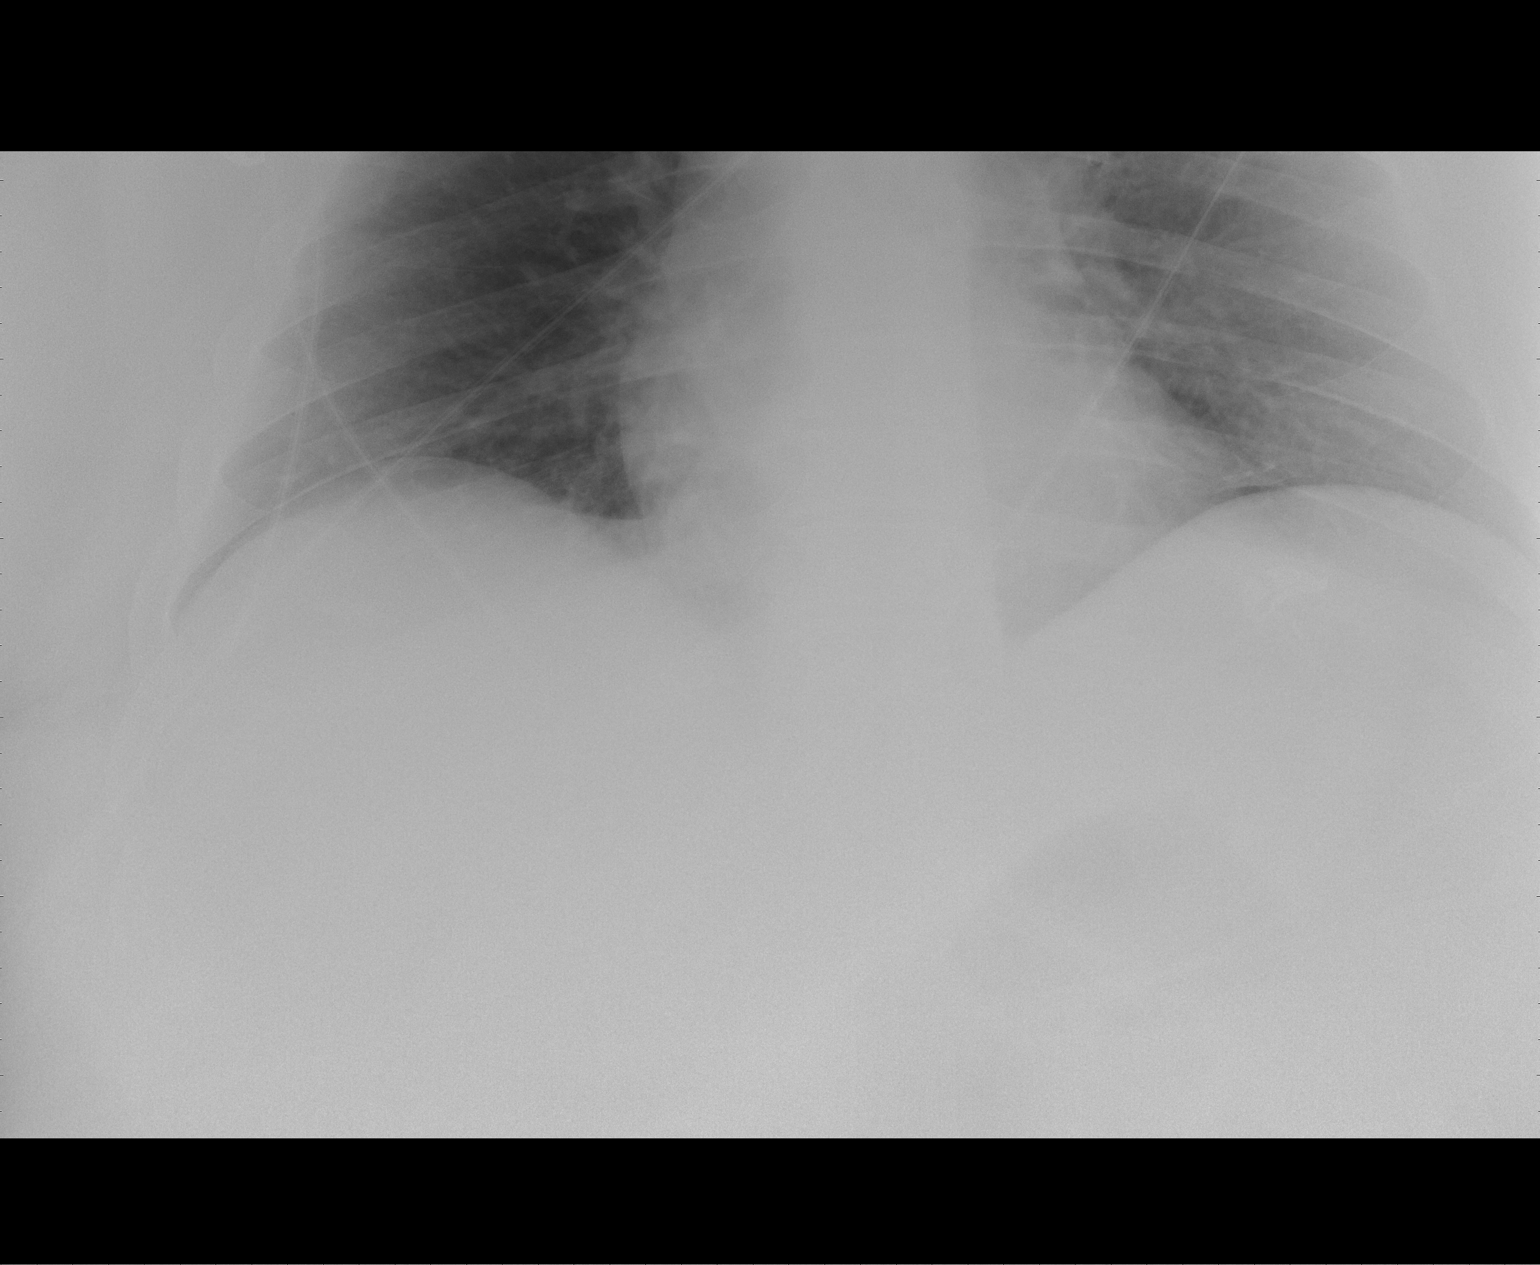

[2 of 2 positions shown; findings below may reference images not displayed]

FINDINGS: Mild peribronchial thickening.  Low lung volumes.  No
confluent opacity or effusion.  Heart is normal size.  No acute
bony abnormality.
IMPRESSION: Mild bronchitic changes.

## 2012-09-11 IMAGING — CT CT ABD-PELV W/ CM
2 of 3 series · 17 of 46 positions shown, 19 images · IV contrast (APPLIED)
Comparison: 12/08/2010

CLINICAL DATA: Mid and lower abdominal pain.  Nausea and vomiting.

CT ABDOMEN AND PELVIS WITH CONTRAST
TECHNIQUE: Multidetector CT imaging of the abdomen and pelvis was
performed following the standard protocol during bolus
administration of intravenous contrast.
Contrast: 100mL OMNIPAQUE IOHEXOL 300 MG/ML IV SOLN

[Series 2: abd/pelv with 5.0 b31f st · axial · 0.98mm/px · z∈[+734,+1204]mm · 14 of 108 slices shown, 16 images]
[im 7/108  soft-tissue]
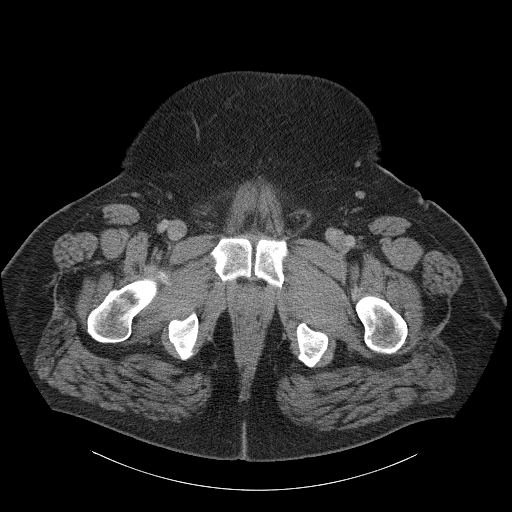
[im 7/108  bone]
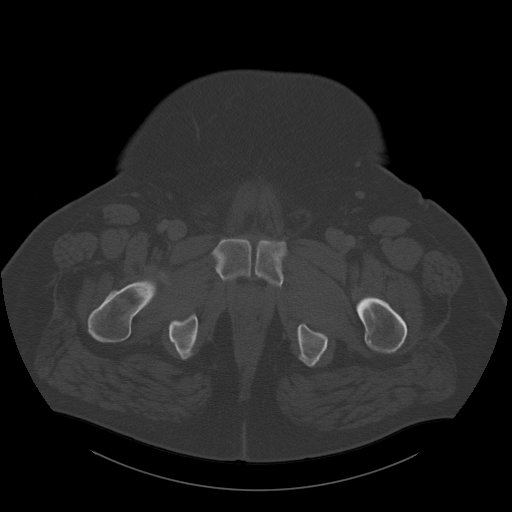
[im 14/108  soft-tissue]
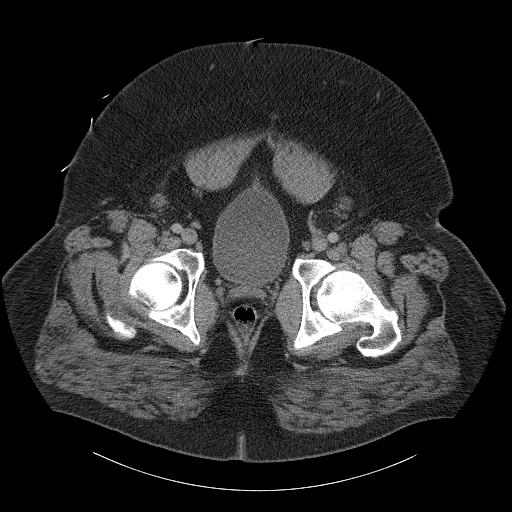
[im 21/108  soft-tissue]
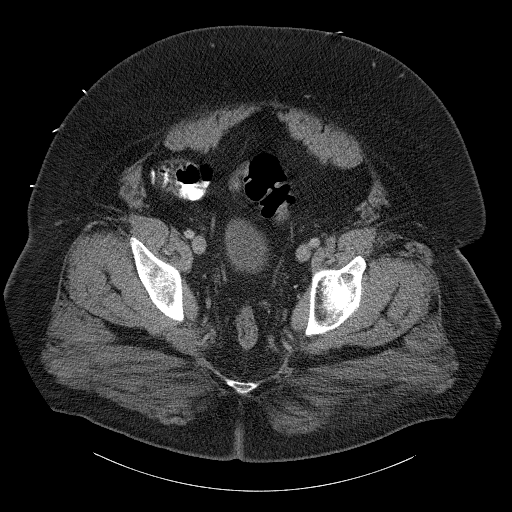
[im 28/108  soft-tissue]
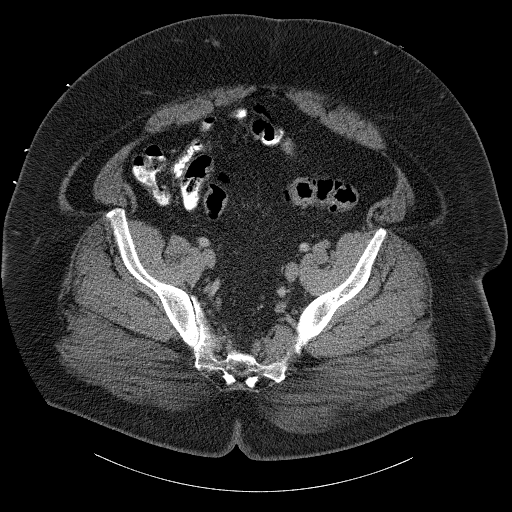
[im 35/108  soft-tissue]
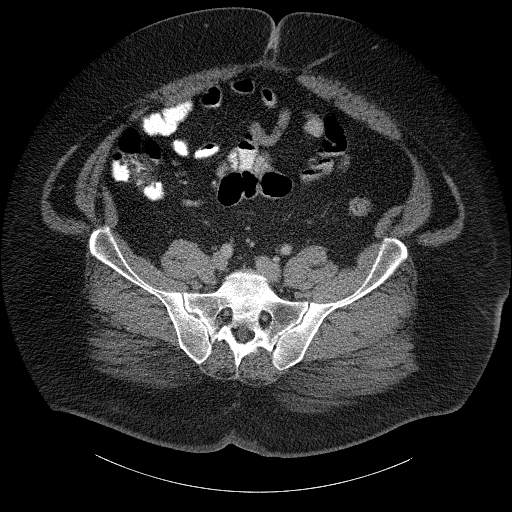
[im 42/108  soft-tissue]
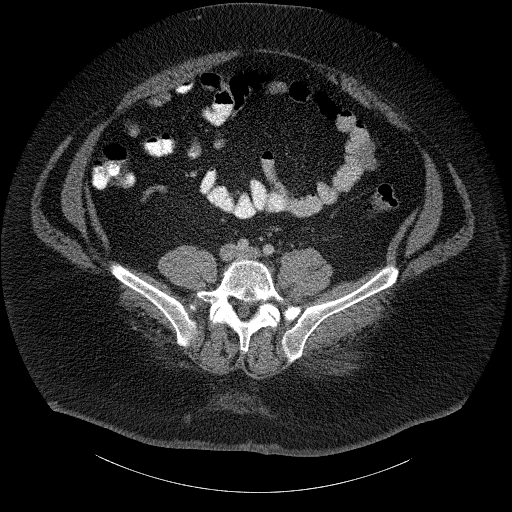
[im 49/108  soft-tissue]
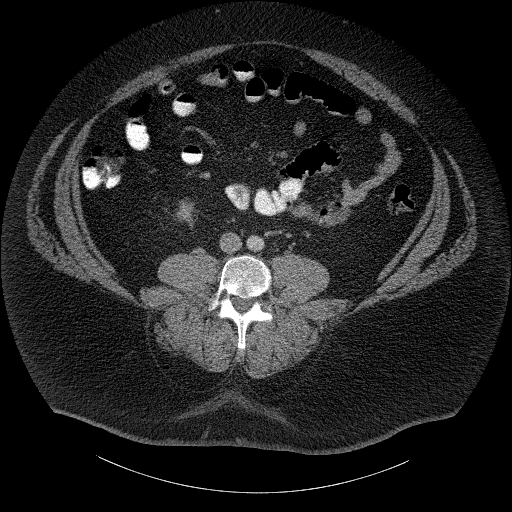
[im 59/108  soft-tissue]
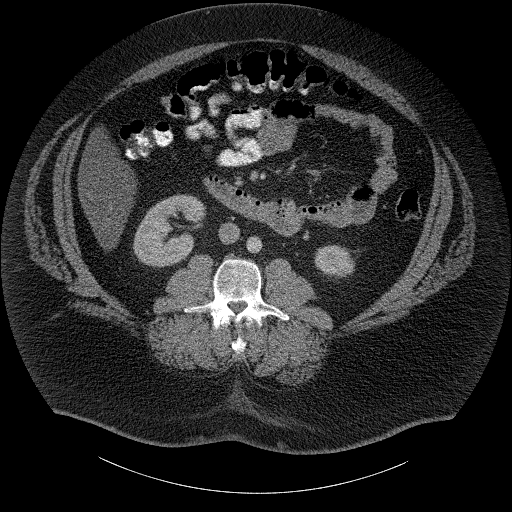
[im 66/108  soft-tissue]
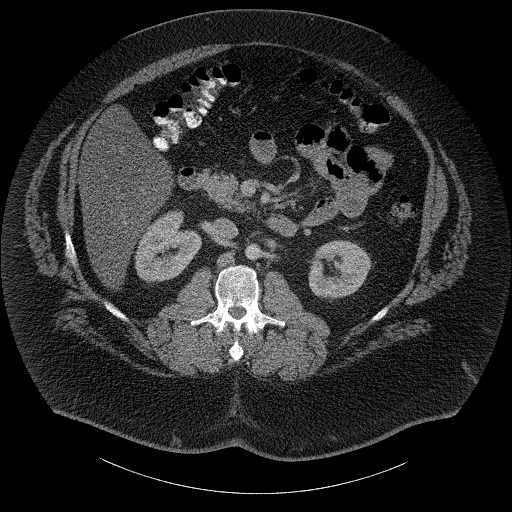
[im 66/108  bone]
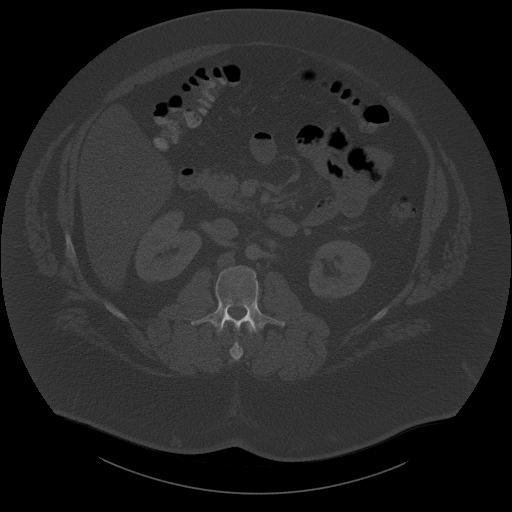
[im 73/108  soft-tissue]
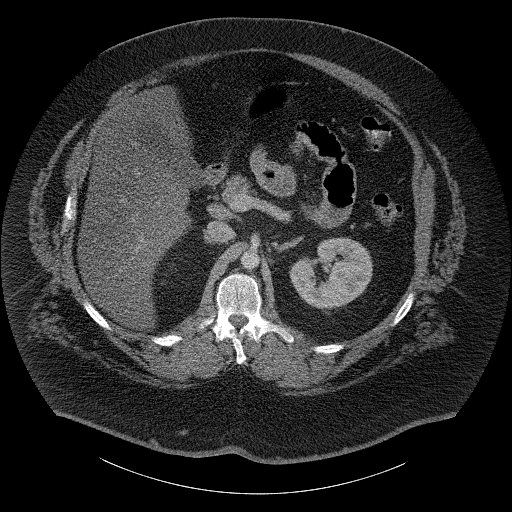
[im 80/108  soft-tissue]
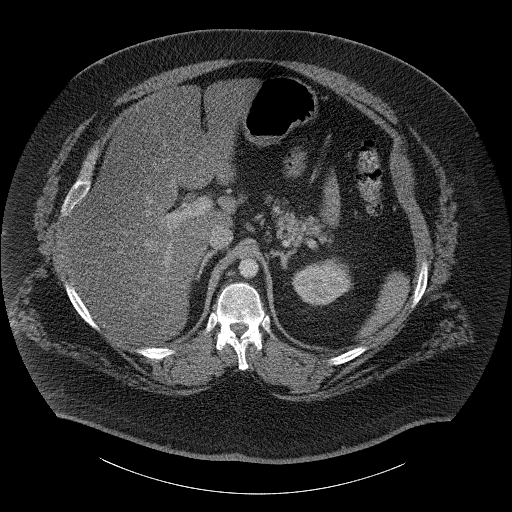
[im 87/108  soft-tissue]
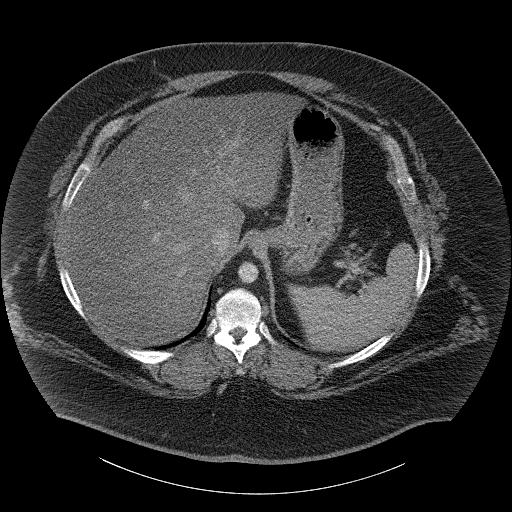
[im 94/108  soft-tissue]
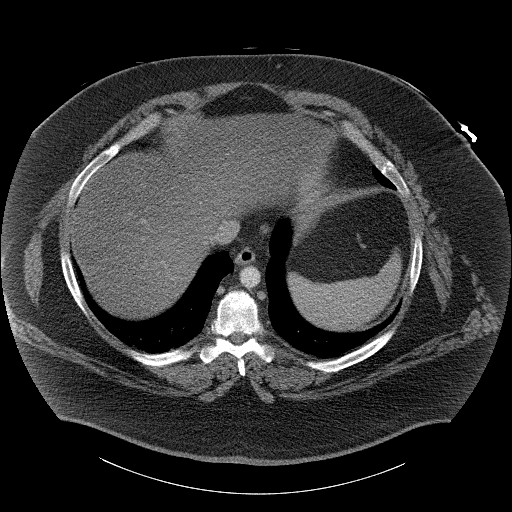
[im 101/108  soft-tissue]
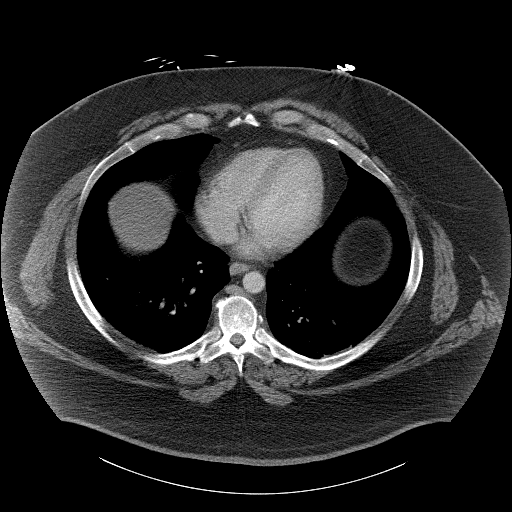

[Series 5: abd/pelv with 2.0 spo cor st · coronal · 1.05mm/px · 3 of 224 slices shown]
[im 75/224  soft-tissue]
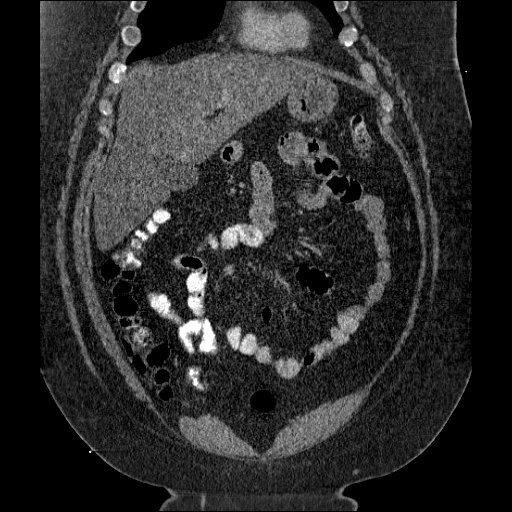
[im 100/224  soft-tissue]
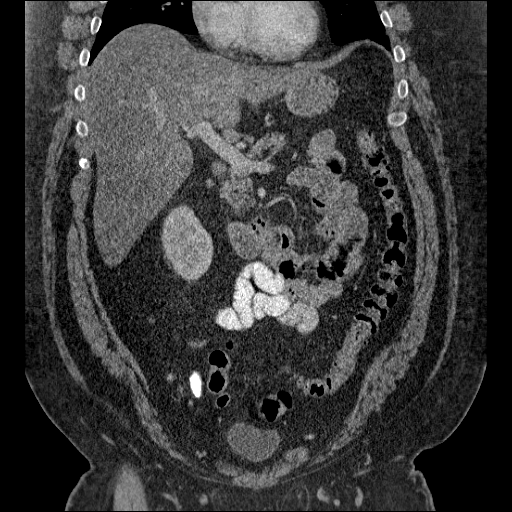
[im 124/224  soft-tissue]
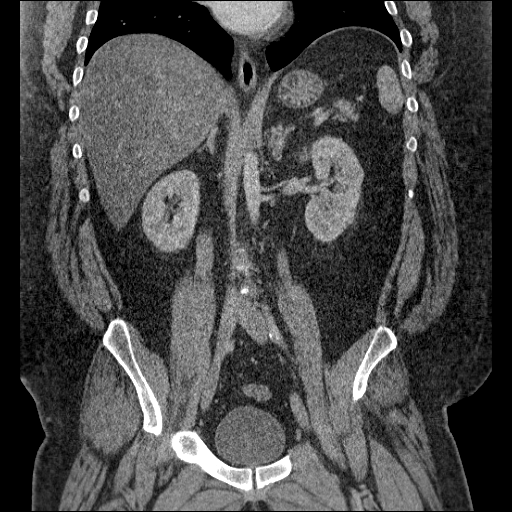

[17 of 46 positions shown; findings below may reference images not displayed]

FINDINGS: Mild dependent changes in the lung bases.  Diffuse low
attenuation change throughout the liver consistent with fatty
infiltration, stable.  The spleen, gallbladder, bile ducts,
pancreas, adrenal glands, kidneys, abdominal aorta, and
retroperitoneal lymph nodes are unremarkable.  The stomach and
small bowel are decompressed.  No free air or free fluid in the
abdomen.

Pelvis:  The bladder wall is not thickened.  No free or loculated
pelvic fluid collections.  No significant pelvic lymphadenopathy.
The appendix is normal.  No inflammatory changes demonstrated in
the sigmoid colon.  Degenerative changes in the lumbar spine.
Degenerative disc disease at the lumbosacral interspace.
IMPRESSION: Diffuse fatty infiltration of the liver.  No acute process
demonstrated in the abdomen or pelvis.

## 2012-10-22 ENCOUNTER — Emergency Department (HOSPITAL_BASED_OUTPATIENT_CLINIC_OR_DEPARTMENT_OTHER): Payer: Medicare Other

## 2012-10-22 ENCOUNTER — Emergency Department (HOSPITAL_BASED_OUTPATIENT_CLINIC_OR_DEPARTMENT_OTHER)
Admission: EM | Admit: 2012-10-22 | Discharge: 2012-10-23 | Disposition: A | Discharge status: Home / Self Care | Payer: Medicare Other | Attending: Emergency Medicine | Admitting: Emergency Medicine

## 2012-10-22 ENCOUNTER — Encounter (HOSPITAL_BASED_OUTPATIENT_CLINIC_OR_DEPARTMENT_OTHER): Payer: Self-pay | Admitting: *Deleted

## 2012-10-22 DIAGNOSIS — Z79899 Other long term (current) drug therapy: Secondary | ICD-10-CM | POA: Insufficient documentation

## 2012-10-22 DIAGNOSIS — IMO0002 Reserved for concepts with insufficient information to code with codable children: Secondary | ICD-10-CM | POA: Insufficient documentation

## 2012-10-22 DIAGNOSIS — F909 Attention-deficit hyperactivity disorder, unspecified type: Secondary | ICD-10-CM | POA: Insufficient documentation

## 2012-10-22 DIAGNOSIS — J45901 Unspecified asthma with (acute) exacerbation: Secondary | ICD-10-CM | POA: Insufficient documentation

## 2012-10-22 DIAGNOSIS — Z8679 Personal history of other diseases of the circulatory system: Secondary | ICD-10-CM | POA: Insufficient documentation

## 2012-10-22 DIAGNOSIS — Z862 Personal history of diseases of the blood and blood-forming organs and certain disorders involving the immune mechanism: Secondary | ICD-10-CM | POA: Insufficient documentation

## 2012-10-22 DIAGNOSIS — R1011 Right upper quadrant pain: Secondary | ICD-10-CM

## 2012-10-22 DIAGNOSIS — R6883 Chills (without fever): Secondary | ICD-10-CM | POA: Insufficient documentation

## 2012-10-22 DIAGNOSIS — I10 Essential (primary) hypertension: Secondary | ICD-10-CM | POA: Insufficient documentation

## 2012-10-22 DIAGNOSIS — R109 Unspecified abdominal pain: Secondary | ICD-10-CM

## 2012-10-22 DIAGNOSIS — E119 Type 2 diabetes mellitus without complications: Secondary | ICD-10-CM | POA: Insufficient documentation

## 2012-10-22 DIAGNOSIS — F17200 Nicotine dependence, unspecified, uncomplicated: Secondary | ICD-10-CM | POA: Insufficient documentation

## 2012-10-22 DIAGNOSIS — R3911 Hesitancy of micturition: Secondary | ICD-10-CM | POA: Insufficient documentation

## 2012-10-22 DIAGNOSIS — K920 Hematemesis: Secondary | ICD-10-CM | POA: Insufficient documentation

## 2012-10-22 DIAGNOSIS — Z8639 Personal history of other endocrine, nutritional and metabolic disease: Secondary | ICD-10-CM | POA: Insufficient documentation

## 2012-10-22 DIAGNOSIS — F319 Bipolar disorder, unspecified: Secondary | ICD-10-CM | POA: Insufficient documentation

## 2012-10-22 LAB — URINALYSIS, ROUTINE W REFLEX MICROSCOPIC
Bilirubin Urine: NEGATIVE
Glucose, UA: NEGATIVE mg/dL
Ketones, ur: NEGATIVE mg/dL
Leukocytes, UA: NEGATIVE
Nitrite: NEGATIVE
Protein, ur: NEGATIVE mg/dL
Specific Gravity, Urine: 1.016 (ref 1.005–1.030)
Urobilinogen, UA: 1 mg/dL (ref 0.0–1.0)
pH: 6 (ref 5.0–8.0)

## 2012-10-22 LAB — CBC WITH DIFFERENTIAL/PLATELET
Basophils Absolute: 0 10*3/uL (ref 0.0–0.1)
Basophils Relative: 0 % (ref 0–1)
Eosinophils Absolute: 0.2 10*3/uL (ref 0.0–0.7)
Eosinophils Relative: 1 % (ref 0–5)
HCT: 48.9 % (ref 39.0–52.0)
Hemoglobin: 16.6 g/dL (ref 13.0–17.0)
Lymphocytes Relative: 19 % (ref 12–46)
Lymphs Abs: 2.9 10*3/uL (ref 0.7–4.0)
MCH: 29.4 pg (ref 26.0–34.0)
MCHC: 33.9 g/dL (ref 30.0–36.0)
MCV: 86.5 fL (ref 78.0–100.0)
Monocytes Absolute: 1.7 10*3/uL — ABNORMAL HIGH (ref 0.1–1.0)
Monocytes Relative: 11 % (ref 3–12)
Neutro Abs: 10.3 10*3/uL — ABNORMAL HIGH (ref 1.7–7.7)
Neutrophils Relative %: 69 % (ref 43–77)
Platelets: 223 10*3/uL (ref 150–400)
RBC: 5.65 MIL/uL (ref 4.22–5.81)
RDW: 14.3 % (ref 11.5–15.5)
WBC: 15.1 10*3/uL — ABNORMAL HIGH (ref 4.0–10.5)

## 2012-10-22 LAB — COMPREHENSIVE METABOLIC PANEL
ALT: 48 U/L (ref 0–53)
AST: 27 U/L (ref 0–37)
Albumin: 3.3 g/dL — ABNORMAL LOW (ref 3.5–5.2)
Alkaline Phosphatase: 70 U/L (ref 39–117)
BUN: 6 mg/dL (ref 6–23)
CO2: 27 mEq/L (ref 19–32)
Calcium: 9.4 mg/dL (ref 8.4–10.5)
Chloride: 96 mEq/L (ref 96–112)
Creatinine, Ser: 0.8 mg/dL (ref 0.50–1.35)
GFR calc Af Amer: >90 mL/min (ref >90)
GFR calc non Af Amer: >90 mL/min (ref >90)
Glucose, Bld: 136 mg/dL — ABNORMAL HIGH (ref 70–99)
Potassium: 3.7 mEq/L (ref 3.5–5.1)
Sodium: 135 mEq/L (ref 135–145)
Total Bilirubin: 0.8 mg/dL (ref 0.3–1.2)
Total Protein: 7.8 g/dL (ref 6.0–8.3)

## 2012-10-22 LAB — URINE MICROSCOPIC-ADD ON

## 2012-10-22 LAB — LIPASE, BLOOD: Lipase: 15 U/L (ref 11–59)

## 2012-10-22 MED ORDER — MORPHINE SULFATE 4 MG/ML IJ SOLN
6.0000 mg | Freq: Once | INTRAMUSCULAR | Status: AC
  Administered 2012-10-22: 6 mg via INTRAVENOUS
  Filled 2012-10-22: qty 1

## 2012-10-22 MED ORDER — SODIUM CHLORIDE 0.9 % IV BOLUS (SEPSIS)
1000.0000 mL | Freq: Once | INTRAVENOUS | Status: AC
  Administered 2012-10-22: 1000 mL via INTRAVENOUS

## 2012-10-22 MED ORDER — IOHEXOL 300 MG/ML  SOLN
50.0000 mL | Freq: Once | INTRAMUSCULAR | Status: AC | PRN
  Administered 2012-10-22: 50 mL via ORAL

## 2012-10-22 MED ORDER — IOHEXOL 300 MG/ML  SOLN
50.0000 mL | Freq: Once | INTRAMUSCULAR | Status: AC | PRN
  Administered 2012-10-22: 100 mL via INTRAVENOUS

## 2012-10-22 MED ORDER — ONDANSETRON HCL 4 MG/2ML IJ SOLN
4.0000 mg | Freq: Once | INTRAMUSCULAR | Status: AC
  Administered 2012-10-22: 4 mg via INTRAVENOUS
  Filled 2012-10-22: qty 2

## 2012-10-22 MED ORDER — HYDROMORPHONE HCL PF 1 MG/ML IJ SOLN
INTRAMUSCULAR | Status: AC
  Administered 2012-10-22: 1 mg
  Filled 2012-10-22: qty 1

## 2012-10-22 MED ORDER — MORPHINE SULFATE 2 MG/ML IJ SOLN
INTRAMUSCULAR | Status: AC
  Filled 2012-10-22: qty 1

## 2012-10-22 NOTE — ED Provider Notes (Signed)
CSN: [id number]     Arrival date & time 10/22/12  2031 History  This chart was scribed for Bryan Razor, MD by Dorothey Baseman, ED Scribe. This patient was seen in room MH05/MH05 and the patient's care was started at 10:02 PM.    Chief Complaint  Patient presents with  . Abdominal Pain    The history is provided by the patient. No language interpreter was used.   HPI Comments: Bryan Gallegos is a 37 y.o. male who presents to the Emergency Department complaining of constant, aching RLQ abdominal pain onset yesterday that is worsening and radiates to his inguinal area. He reports multiple episodes of emesis with small amounts of bright red blood in later episodes after eating with subsequent pain exacerbation. He reports that he has taken oxycodone at home, but that he cannot keep it down. Patient reports some associated urinary hesitancy, chills, shortness of breath, and dark stools without visualized blood. Patient reports he has experienced similar pain in the past, but it was less severe. He denies history of gallbladder disease and kidney stones. Patient denies history of abdominal surgeries. Patient denies cough, fever, or any other symptoms. Patient reports allergies to steroids, atenolol, tylenol, and ibuprofen.  Past Medical History  Diagnosis Date  . Heart valve disorder   . Hypertension   . Asthma   . Diabetes mellitus   . Hyperlipidemia   . Anaphylactic reaction   . Bipolar disorder   . Adult ADHD   . Morbidly obese    Past Surgical History  Procedure Laterality Date  . Carpal tunnel release     History reviewed. No pertinent family history. History  Substance Use Topics  . Smoking status: Current Every Day Smoker -- 1.00 packs/day for 23 years    Types: Cigarettes  . Smokeless tobacco: Not on file  . Alcohol Use: No    Review of Systems  A complete 10 system review of systems was obtained and all systems are negative except as noted in the HPI and PMH.   Allergies   Atenolol; Other; and Tylenol  Home Medications   Current Outpatient Rx  Name  Route  Sig  Dispense  Refill  . amphetamine-dextroamphetamine (ADDERALL) 30 MG tablet   Oral   Take 30 mg by mouth 2 (two) times daily.         . cloNIDine (CATAPRES) 0.2 MG tablet   Oral   Take 0.2 mg by mouth every morning.          . diazepam (VALIUM) 5 MG tablet   Oral   Take 5 mg by mouth every 6 (six) hours as needed. anxiety         . Fluticasone-Salmeterol (ADVAIR) 250-50 MCG/DOSE AEPB   Inhalation   Inhale 1 puff into the lungs every 12 (twelve) hours.         . folic acid (FOLVITE) 1 MG tablet   Oral   Take 1 mg by mouth daily.         Marland Kitchen lisinopril (PRINIVIL,ZESTRIL) 20 MG tablet   Oral   Take 20 mg by mouth daily.         . methocarbamol (ROBAXIN) 500 MG tablet   Oral   Take 1 tablet (500 mg total) by mouth 2 (two) times daily.   20 tablet   0   . ondansetron (ZOFRAN) 4 MG tablet   Oral   Take 1 tablet (4 mg total) by mouth every 8 (eight) hours as needed for nausea.  20 tablet   0   . oxycodone (ROXICODONE) 30 MG immediate release tablet   Oral   Take 30 mg by mouth every 4 (four) hours as needed. For pain.         . sitaGLIPtan-metformin (JANUMET) 50-500 MG per tablet   Oral   Take 1 tablet by mouth 2 (two) times daily with a meal.         . EXPIRED: sucralfate (CARAFATE) 1 G tablet   Oral   Take 1 tablet (1 g total) by mouth 4 (four) times daily.   30 tablet   0   . testosterone (ANDROGEL) 50 MG/5GM GEL   Transdermal   Place 10 g onto the skin 2 (two) times daily.          Triage Vitals: BP 139/103  Pulse 95  Temp(Src) 99.1 F (37.3 C) (Oral)  Resp 20  Ht 6\' 2"  (1.88 m)  Wt 413 lb (187.336 kg)  BMI 53 kg/m2  SpO2 100%  Physical Exam  Nursing note and vitals reviewed. Constitutional: He is oriented to person, place, and time. He appears well-developed and well-nourished. No distress.  Patient is morbidly obese and appears  uncomfortable.  HENT:  Head: Normocephalic and atraumatic.  Eyes: Conjunctivae are normal.  Neck: Normal range of motion.  Cardiovascular: Normal rate, regular rhythm and normal heart sounds.   Pulmonary/Chest: Effort normal and breath sounds normal. No respiratory distress.  Abdominal: Soft. There is tenderness.  RLQ, RUQ, and epigastric tenderness   Musculoskeletal: Normal range of motion.  Neurological: He is alert and oriented to person, place, and time.  Skin: Skin is warm and dry.  Psychiatric: He has a normal mood and affect. His behavior is normal.    ED Course   Medications  morphine 2 MG/ML injection (not administered)  morphine 4 MG/ML injection 6 mg (6 mg Intravenous Given 10/22/12 2149)  ondansetron (ZOFRAN) injection 4 mg (4 mg Intravenous Given 10/22/12 2149)    DIAGNOSTIC STUDIES: Oxygen Saturation is 100% on room air, normal by my interpretation.    COORDINATION OF CARE: 10:07PM- Discussed possible gallbladder issue. Will order CT scan and UA. Will order morphine and zofran to manage pain symptoms. Discussed plan with patient at bedside and patient verbalized agreement.  Procedures (including critical care time)  Labs Reviewed  URINALYSIS, ROUTINE W REFLEX MICROSCOPIC - Abnormal; Notable for the following:    Color, Urine AMBER (*)    Hgb urine dipstick MODERATE (*)    All other components within normal limits  CBC WITH DIFFERENTIAL - Abnormal; Notable for the following:    WBC 15.1 (*)    Neutro Abs 10.3 (*)    Monocytes Absolute 1.7 (*)    All other components within normal limits  COMPREHENSIVE METABOLIC PANEL - Abnormal; Notable for the following:    Glucose, Bld 136 (*)    Albumin 3.3 (*)    All other components within normal limits  URINE MICROSCOPIC-ADD ON  LIPASE, BLOOD   Ct Abdomen Pelvis W Contrast  10/22/2012   *RADIOLOGY REPORT*  Clinical Data: Abdominal pain right lower quadrant with radiation to the inguinal region.  Vomiting.  CT ABDOMEN  AND PELVIS WITH CONTRAST  Technique:  Multidetector CT imaging of the abdomen and pelvis was performed following the standard protocol during bolus administration of intravenous contrast.  Contrast: 50mL OMNIPAQUE IOHEXOL 300 MG/ML  SOLN, OMNIPAQUE IOHEXOL 300 MG/ML  SOLN  Comparison: 06/19/2011 and 09/10/2009  Findings: Lung bases demonstrate a 4-5  mm nodular density over the right middle lobe unchanged from 2011.  There is a 7 mm nodule over the posterior right base unchanged.  Abdominal images demonstrate a normal liver, spleen, gallbladder, pancreas and adrenal glands.  Kidneys are normal in size without evidence of hydronephrosis or nephrolithiasis.  Ureters are within normal.  The appendix is normal.  There is no free fluid or focal inflammatory change.  Pelvic images demonstrate no focal abnormality.  Remaining bones and soft tissues are within normal.  IMPRESSION: No acute findings in the abdomen/pelvis.  Two small nodules of the right middle and lower lobe unchanged from 2011.   Original Report Authenticated By: Elberta Fortis, M.D.   1. Abdominal pain     MDM  37 year old male with abdominal pain. Right sided. Urinalysis does show some blood otherwise known signs of infection. There no evidence of ureteral stone on CT although evaluation is somewhat limited with IV contrast administration. No evidence of appendicitis or other acute pathology. Gallbladder grossly normal in appearance. Possibly passed ureteral stone? Could potentially be biliary colic as well particularly with increased postprandial pain. CT is not the test of choice to evaluate the gallbladder, but there is no CT evidence of gallstones, sludge or cholecystitis. Patient does have a leukocytosis, but this is nonspecific. LFTs and lipase are normal. Patient is afebrile. Patient had an ultrasound in April of this past year which showed no evidence of cholelithiasis either. Unfortunately no ultrasound available at [facility] this time of  day. Does not appear to be an urgent/emergent issue and I feel pt is safe for discharge at this time. Will arrange for [place] tomorrow. PRN pain meds/antiemetic. Immediate return precautions discussed.  I personally preformed the services scribed in my presence. The recorded information has been reviewed is accurate. Bryan Razor, MD.    Bryan Razor, MD 10/23/12 [phone]

## 2012-10-22 NOTE — ED Notes (Signed)
Verbal order for dilaudid 1 mg iv now per Dr Juleen China

## 2012-10-22 NOTE — ED Notes (Addendum)
Patient with right sided abdominal pain possibly related to gall bladder, per patient.  Experiencing nausea and vomiting and blood in stool today.  Pain started yesterday and today it's worse.

## 2012-10-22 NOTE — ED Notes (Signed)
MD at bedside. 

## 2012-10-23 ENCOUNTER — Ambulatory Visit (HOSPITAL_BASED_OUTPATIENT_CLINIC_OR_DEPARTMENT_OTHER)
Admission: RE | Admit: 2012-10-23 | Discharge: 2012-10-23 | Disposition: A | Discharge status: Home / Self Care | Payer: Medicare Other | Source: Ambulatory Visit | Attending: Emergency Medicine | Admitting: Emergency Medicine

## 2012-10-23 ENCOUNTER — Ambulatory Visit (HOSPITAL_BASED_OUTPATIENT_CLINIC_OR_DEPARTMENT_OTHER): Admit: 2012-10-23 | Payer: Medicare Other

## 2012-10-23 DIAGNOSIS — R1011 Right upper quadrant pain: Secondary | ICD-10-CM

## 2012-10-23 MED ORDER — ONDANSETRON HCL 4 MG PO TABS: 4.0000 mg | ORAL_TABLET | Freq: Four times a day (QID) | ORAL | Status: DC

## 2012-10-23 MED ORDER — OXYCODONE HCL 5 MG PO TABS: 5.0000 mg | ORAL_TABLET | ORAL | Status: DC | PRN

## 2012-10-23 MED ORDER — HYDROMORPHONE HCL PF 1 MG/ML IJ SOLN
INTRAMUSCULAR | Status: AC
  Filled 2012-10-23: qty 1

## 2012-10-23 MED ORDER — HYDROMORPHONE HCL PF 1 MG/ML IJ SOLN
1.0000 mg | Freq: Once | INTRAMUSCULAR | Status: AC
  Administered 2012-10-23: 1 mg via INTRAVENOUS
  Filled 2012-10-23: qty 1

## 2012-10-23 MED ORDER — LORAZEPAM 2 MG/ML IJ SOLN
1.0000 mg | Freq: Once | INTRAMUSCULAR | Status: AC
  Administered 2012-10-23: 1 mg via INTRAVENOUS
  Filled 2012-10-23: qty 1

## 2012-11-16 ENCOUNTER — Encounter (HOSPITAL_BASED_OUTPATIENT_CLINIC_OR_DEPARTMENT_OTHER): Payer: Self-pay

## 2012-11-16 ENCOUNTER — Other Ambulatory Visit: Payer: Self-pay

## 2012-11-16 ENCOUNTER — Emergency Department (HOSPITAL_BASED_OUTPATIENT_CLINIC_OR_DEPARTMENT_OTHER)
Admission: EM | Admit: 2012-11-16 | Discharge: 2012-11-17 | Disposition: A | Discharge status: Home / Self Care | Payer: Medicare Other | Attending: Emergency Medicine | Admitting: Emergency Medicine

## 2012-11-16 DIAGNOSIS — R1011 Right upper quadrant pain: Secondary | ICD-10-CM | POA: Insufficient documentation

## 2012-11-16 DIAGNOSIS — F909 Attention-deficit hyperactivity disorder, unspecified type: Secondary | ICD-10-CM | POA: Insufficient documentation

## 2012-11-16 DIAGNOSIS — F319 Bipolar disorder, unspecified: Secondary | ICD-10-CM | POA: Insufficient documentation

## 2012-11-16 DIAGNOSIS — J45909 Unspecified asthma, uncomplicated: Secondary | ICD-10-CM | POA: Insufficient documentation

## 2012-11-16 DIAGNOSIS — E119 Type 2 diabetes mellitus without complications: Secondary | ICD-10-CM | POA: Insufficient documentation

## 2012-11-16 DIAGNOSIS — E785 Hyperlipidemia, unspecified: Secondary | ICD-10-CM | POA: Insufficient documentation

## 2012-11-16 DIAGNOSIS — F17200 Nicotine dependence, unspecified, uncomplicated: Secondary | ICD-10-CM | POA: Insufficient documentation

## 2012-11-16 DIAGNOSIS — I38 Endocarditis, valve unspecified: Secondary | ICD-10-CM | POA: Insufficient documentation

## 2012-11-16 DIAGNOSIS — I10 Essential (primary) hypertension: Secondary | ICD-10-CM | POA: Insufficient documentation

## 2012-11-16 LAB — URINALYSIS, ROUTINE W REFLEX MICROSCOPIC
Bilirubin Urine: NEGATIVE
Glucose, UA: NEGATIVE mg/dL
Hgb urine dipstick: NEGATIVE
Specific Gravity, Urine: 1.009 (ref 1.005–1.030)

## 2012-11-16 NOTE — ED Notes (Signed)
Pt upset about wait time for ED room, pt slid from couch in lobby to floor and laid in floor. No LOC, pt was then able to get self up from floor and into a W/C. Upon placing pt in ED room, pt was able to stand and walk to ED stretcher without assistance.

## 2012-11-16 NOTE — ED Notes (Addendum)
Pt c/o left side CP x 1.5 hrs-pt points to left/central chest and RUQ abd for c/o pain sites

## 2012-11-16 NOTE — ED Notes (Signed)
Pt brought in by EMS for c/o abd pain. Pt initially wanting to leave upon arrival to this facility because he was having to go to triage due to no empty rooms. Pt made aware that he would be seen as soon as possible and if he doesn't check in as a patient then he needed to leave the premises. Pt finally decided to check in to be seen. Pt walking around in waiting room using cell phone in NAD.

## 2012-11-17 ENCOUNTER — Emergency Department (HOSPITAL_COMMUNITY): Payer: Medicare Other

## 2012-11-17 ENCOUNTER — Encounter (HOSPITAL_COMMUNITY): Payer: Self-pay | Admitting: Radiology

## 2012-11-17 LAB — CBC WITH DIFFERENTIAL/PLATELET
Basophils Absolute: 0 10*3/uL (ref 0.0–0.1)
Basophils Relative: 0 % (ref 0–1)
Eosinophils Absolute: 0.2 10*3/uL (ref 0.0–0.7)
MCH: 29.8 pg (ref 26.0–34.0)
MCHC: 34.5 g/dL (ref 30.0–36.0)
Monocytes Relative: 6 % (ref 3–12)
Neutro Abs: 8.6 10*3/uL — ABNORMAL HIGH (ref 1.7–7.7)
Neutrophils Relative %: 71 % (ref 43–77)
Platelets: 283 10*3/uL (ref 150–400)
RDW: 14.1 % (ref 11.5–15.5)

## 2012-11-17 LAB — COMPREHENSIVE METABOLIC PANEL
ALT: 46 U/L (ref 0–53)
AST: 38 U/L — ABNORMAL HIGH (ref 0–37)
Alkaline Phosphatase: 77 U/L (ref 39–117)
CO2: 27 mEq/L (ref 19–32)
Chloride: 96 mEq/L (ref 96–112)
GFR calc Af Amer: >90 mL/min (ref >90)
GFR calc non Af Amer: >90 mL/min (ref >90)
Glucose, Bld: 150 mg/dL — ABNORMAL HIGH (ref 70–99)
Sodium: 135 mEq/L (ref 135–145)
Total Bilirubin: 1 mg/dL (ref 0.3–1.2)

## 2012-11-17 LAB — LIPASE, BLOOD: Lipase: 25 U/L (ref 11–59)

## 2012-11-17 MED ORDER — ONDANSETRON HCL 4 MG/2ML IJ SOLN
4.0000 mg | Freq: Once | INTRAMUSCULAR | Status: AC
  Administered 2012-11-17: 4 mg via INTRAVENOUS
  Filled 2012-11-17: qty 2

## 2012-11-17 MED ORDER — GI COCKTAIL ~~LOC~~
30.0000 mL | Freq: Once | ORAL | Status: AC
  Administered 2012-11-17: 30 mL via ORAL
  Filled 2012-11-17: qty 30

## 2012-11-17 MED ORDER — HYDROMORPHONE HCL PF 1 MG/ML IJ SOLN
1.0000 mg | Freq: Once | INTRAMUSCULAR | Status: AC
  Administered 2012-11-17: 1 mg via INTRAVENOUS
  Filled 2012-11-17: qty 1

## 2012-11-17 MED ORDER — SODIUM CHLORIDE 0.9 % IV SOLN
INTRAVENOUS | Status: DC
  Administered 2012-11-17: 02:00:00 via INTRAVENOUS

## 2012-11-17 MED ORDER — IOHEXOL 300 MG/ML  SOLN
100.0000 mL | Freq: Once | INTRAMUSCULAR | Status: AC | PRN
  Administered 2012-11-17: 100 mL via INTRAVENOUS

## 2012-11-17 MED ORDER — ONDANSETRON HCL 4 MG PO TABS: 4.0000 mg | ORAL_TABLET | Freq: Three times a day (TID) | ORAL | Status: DC | PRN

## 2012-11-17 MED ORDER — IOHEXOL 300 MG/ML  SOLN
25.0000 mL | Freq: Once | INTRAMUSCULAR | Status: AC | PRN
  Administered 2012-11-17: 25 mL via ORAL

## 2012-11-17 MED ORDER — PANTOPRAZOLE SODIUM 40 MG IV SOLR
40.0000 mg | Freq: Once | INTRAVENOUS | Status: AC
  Administered 2012-11-17: 40 mg via INTRAVENOUS
  Filled 2012-11-17: qty 40

## 2012-11-17 NOTE — ED Notes (Signed)
MD at bedside. Pt states that he was on the phone trying to get help and called [facility] for assistance. Advised pt that we were affiliated with [facility] and would be treating him as warranted.

## 2012-11-17 NOTE — ED Notes (Signed)
MD in to see pt but pt talking on phone. Advised pt that EDP would be in to see him when he was through with his phone call.

## 2012-11-17 NOTE — ED Notes (Signed)
Pt requesting phone to make a call, phone given to pt, pt then used phone and threw it across the room. Requested that pt not tear up equipment. Pt still requesting pain meds, pt again made aware that no meds would be given until seen by EDP.

## 2012-11-17 NOTE — ED Provider Notes (Signed)
CSN: [id number]     Arrival date & time 11/16/12  2125 History   First MD Initiated Contact with Patient 11/17/12 0105     Chief Complaint  Patient presents with  . Abdominal Pain   (Consider location/radiation/quality/duration/timing/severity/associated sxs/prior Treatment) HPI Comments: 37 yo male patient with hx of HTN, morbid obesity, HTN, DM presents transferred from [facility] for evaluation of RUQ abdominal pain with abdominal US here. Briefly, patient reports N/V and RUQ pain starting last night after eating. Has had previous episodes over the past year and had CT scans of abd that have not revealed acute pathology. Denies fever, chills, recent illness.  Patient is a 37 y.o. male presenting with abdominal pain. The history is provided by the patient.  Abdominal Pain Associated symptoms: nausea and vomiting   Associated symptoms: no cough, no dysuria, no fever and no sore throat     Past Medical History  Diagnosis Date  . Heart valve disorder   . Hypertension   . Asthma   . Diabetes mellitus   . Hyperlipidemia   . Anaphylactic reaction   . Bipolar disorder   . Adult ADHD   . Morbidly obese    Past Surgical History  Procedure Laterality Date  . Carpal tunnel release     No family history on file. History  Substance Use Topics  . Smoking status: Current Every Day Smoker -- 1.00 packs/day for 23 years    Types: Cigarettes  . Smokeless tobacco: Not on file  . Alcohol Use: No    Review of Systems  Constitutional: Negative for fever.  HENT: Negative for sore throat, rhinorrhea and neck pain.   Eyes: Negative for visual disturbance.  Respiratory: Negative for cough.   Cardiovascular: Negative for palpitations.  Gastrointestinal: Positive for nausea, vomiting and abdominal pain.  Genitourinary: Negative for dysuria.  Musculoskeletal: Negative for back pain.  Skin: Negative for rash.  Neurological: Negative for headaches.  Hematological: Negative for  adenopathy.  Psychiatric/Behavioral: Negative for agitation.    Allergies  Atenolol; Other; and Tylenol  Home Medications   Current Outpatient Rx  Name  Route  Sig  Dispense  Refill  . amphetamine-dextroamphetamine (ADDERALL) 30 MG tablet   Oral   Take 30 mg by mouth 2 (two) times daily.         . cloNIDine (CATAPRES) 0.2 MG tablet   Oral   Take 0.2 mg by mouth every morning.          . diazepam (VALIUM) 5 MG tablet   Oral   Take 5 mg by mouth every 6 (six) hours as needed. anxiety         . Fluticasone-Salmeterol (ADVAIR) 250-50 MCG/DOSE AEPB   Inhalation   Inhale 1 puff into the lungs every 12 (twelve) hours.         . folic acid (FOLVITE) 1 MG tablet   Oral   Take 1 mg by mouth daily.         Marland Kitchen lisinopril (PRINIVIL,ZESTRIL) 20 MG tablet   Oral   Take 20 mg by mouth daily.         Marland Kitchen oxycodone (ROXICODONE) 30 MG immediate release tablet   Oral   Take 30 mg by mouth every 4 (four) hours as needed. For pain.         Marland Kitchen oxyCODONE (ROXICODONE) 5 MG immediate release tablet   Oral   Take 1-2 tablets (5-10 mg total) by mouth every 4 (four) hours as needed for  pain.   15 tablet   0   . sitaGLIPtan-metformin (JANUMET) 50-500 MG per tablet   Oral   Take 1 tablet by mouth 2 (two) times daily with a meal.         . zolpidem (AMBIEN) 10 MG tablet   Oral   Take 10 mg by mouth at bedtime as needed for sleep.          BP 130/41  Pulse 89  Temp(Src) 97.5 F (36.4 C) (Oral)  Resp 20  Ht 6\' 2"  (1.88 m)  Wt 424 lb (192.325 kg)  BMI 54.42 kg/m2  SpO2 98% Physical Exam  Nursing note and vitals reviewed. Constitutional: He is oriented to person, place, and time. He appears well-developed and well-nourished.  HENT:  Head: Normocephalic and atraumatic.  Eyes: EOM are normal.  Neck: Normal range of motion. Neck supple.  Cardiovascular: Normal rate, regular rhythm, normal heart sounds and intact distal pulses.   Pulmonary/Chest: Effort normal and breath  sounds normal.  Abdominal: Soft. There is tenderness ( mild RUQ).  Musculoskeletal: Normal range of motion. He exhibits no tenderness.  Neurological: He is alert and oriented to person, place, and time.  Skin: Skin is dry.  Psychiatric: He has a normal mood and affect.    ED Course  Procedures (including critical care time) Labs Review Labs Reviewed  URINALYSIS, ROUTINE W REFLEX MICROSCOPIC - Abnormal; Notable for the following:    pH 8.5 (*)    All other components within normal limits  COMPREHENSIVE METABOLIC PANEL - Abnormal; Notable for the following:    Glucose, Bld 150 (*)    Total Protein 8.5 (*)    AST 38 (*)    All other components within normal limits  CBC WITH DIFFERENTIAL - Abnormal; Notable for the following:    WBC 12.2 (*)    Neutro Abs 8.6 (*)    All other components within normal limits  LIPASE, BLOOD   Imaging Review US Abdomen Complete  11/17/2012   CLINICAL DATA:  Abdominal pain.  EXAM: ABDOMEN ULTRASOUND  COMPARISON:  None.  FINDINGS: Gallbladder  No gallstones or wall thickening. Negative sonographic Murphy's sign.  Common bile duct  Diameter: 5 mm  Liver  Diffusely increased echogenicity, with poor acoustic transmission. No focal abnormality.  IVC  Limited visualization of by grayscale.  Pancreas  Limited visualization.  Spleen  Borderline enlarged at 11 cm. No focal abnormality.  Right Kidney  Length: 13 cm. Echogenicity within normal limits. No mass or hydronephrosis visualized.  Left Kidney  Length: 13 cm. Echogenicity within normal limits. No mass or hydronephrosis visualized.  Abdominal aorta  No aneurysm visualized.  IMPRESSION: 1. Hepatic steatosis. 2. No cholelithiasis or acute cholecystitis.   Electronically Signed   By: Tiburcio Pea   On: 11/17/2012 04:58   Ct Abdomen Pelvis W Contrast  11/17/2012   CLINICAL DATA:  Right upper quadrant pain for 8 hrs. Nausea and vomiting.  EXAM: CT ABDOMEN AND PELVIS WITH CONTRAST  TECHNIQUE: Multidetector CT imaging  of the abdomen and pelvis was performed using the standard protocol following bolus administration of intravenous contrast.  CONTRAST:  OMNIPAQUE IOHEXOL 300 MG/ML  SOLN  COMPARISON:  CT abdomen and pelvis 10/22/2012, 12/13/2010 and 09/18/2008.  FINDINGS: The patient has a 0.4 cm right lower lobe pulmonary nodule on image 6 and a tiny subpleural nodule in the right middle lobe on image 4, both unchanged from 2010. There is no pleural or pericardial effusion. Heart size is normal.  The liver is low attenuating consistent with fatty infiltration. No focal liver lesion is identified. The gallbladder, spleen, adrenal glands, pancreas and kidneys appear normal. The stomach, small large bowel and appendix appear normal. No lymphadenopathy or fluid is identified. No focal bony abnormality.  IMPRESSION:  No acute finding or finding to explain the patient's symptoms.  Fatty infiltration of the liver.   Electronically Signed   By: Drusilla Kanner M.D.   On: 11/17/2012 07:45    MDM   1. RUQ abdominal pain    Patient accepted as transfer from [facility] to obtain abdominal US to r/o acute cholecystitis. [place] neg for acute findings. Patient continued to have pain so CT abd pursued, dilaudid for pain.   VSS, CT abd/pelvis neg for acute pathology. No futher N/V, pain improved, patient requesting discharge. Repeat abd exam reveals abd soft and minimally tender RUQ, felt stable for d/c. Strict return precautions discussed and provided to the patient in writing at time of d/c. Recommend close f/u with his PCP for possible GI referral. Script for zofran provided.     Simmie Davies, NP 11/17/12 1226

## 2012-11-17 NOTE — ED Notes (Signed)
Pt c/o RUQ pain.  Pt states that after he eats he vomits.  Pt c/o CP.

## 2012-11-17 NOTE — ED Provider Notes (Signed)
CSN: [id number]     Arrival date & time 11/16/12  2125 History   First MD Initiated Contact with Patient 11/17/12 0105     Chief Complaint  Patient presents with  . Abdominal Pain   (Consider location/radiation/quality/duration/timing/severity/associated sxs/prior Treatment) HPI This is a 37 year old male with morbid obesity. He is here with about 8 hours of right upper quadrant pain. He describes the pain as sharp and severe. It is worse with movement, palpation or deep breathing. It has been associated with nausea and vomiting but no diarrhea, fever or chills. He states he still has his gallbladder and his appendix.  Past Medical History  Diagnosis Date  . Heart valve disorder   . Hypertension   . Asthma   . Diabetes mellitus   . Hyperlipidemia   . Anaphylactic reaction   . Bipolar disorder   . Adult ADHD   . Morbidly obese    Past Surgical History  Procedure Laterality Date  . Carpal tunnel release     No family history on file. History  Substance Use Topics  . Smoking status: Current Every Day Smoker -- 1.00 packs/day for 23 years    Types: Cigarettes  . Smokeless tobacco: Not on file  . Alcohol Use: No    Review of Systems  All other systems reviewed and are negative.    Allergies  Atenolol; Other; and Tylenol  Home Medications   Current Outpatient Rx  Name  Route  Sig  Dispense  Refill  . amphetamine-dextroamphetamine (ADDERALL) 30 MG tablet   Oral   Take 30 mg by mouth 2 (two) times daily.         . cloNIDine (CATAPRES) 0.2 MG tablet   Oral   Take 0.2 mg by mouth every morning.          . diazepam (VALIUM) 5 MG tablet   Oral   Take 5 mg by mouth every 6 (six) hours as needed. anxiety         . Fluticasone-Salmeterol (ADVAIR) 250-50 MCG/DOSE AEPB   Inhalation   Inhale 1 puff into the lungs every 12 (twelve) hours.         . folic acid (FOLVITE) 1 MG tablet   Oral   Take 1 mg by mouth daily.         Marland Kitchen lisinopril (PRINIVIL,ZESTRIL) 20  MG tablet   Oral   Take 20 mg by mouth daily.         . methocarbamol (ROBAXIN) 500 MG tablet   Oral   Take 1 tablet (500 mg total) by mouth 2 (two) times daily.   20 tablet   0   . ondansetron (ZOFRAN) 4 MG tablet   Oral   Take 1 tablet (4 mg total) by mouth every 8 (eight) hours as needed for nausea.   20 tablet   0   . ondansetron (ZOFRAN) 4 MG tablet   Oral   Take 1 tablet (4 mg total) by mouth every 6 (six) hours.   12 tablet   0   . oxycodone (ROXICODONE) 30 MG immediate release tablet   Oral   Take 30 mg by mouth every 4 (four) hours as needed. For pain.         Marland Kitchen oxyCODONE (ROXICODONE) 5 MG immediate release tablet   Oral   Take 1-2 tablets (5-10 mg total) by mouth every 4 (four) hours as needed for pain.   15 tablet   0   . sitaGLIPtan-metformin (JANUMET) 50-500  MG per tablet   Oral   Take 1 tablet by mouth 2 (two) times daily with a meal.         . EXPIRED: sucralfate (CARAFATE) 1 G tablet   Oral   Take 1 tablet (1 g total) by mouth 4 (four) times daily.   30 tablet   0   . testosterone (ANDROGEL) 50 MG/5GM GEL   Transdermal   Place 10 g onto the skin 2 (two) times daily.          BP 138/82  Pulse 90  Temp(Src) 99 F (37.2 C) (Oral)  Resp 18  Ht 6\' 2"  (1.88 m)  Wt 424 lb (192.325 kg)  BMI 54.42 kg/m2  SpO2 95%  Physical Exam General: Well-developed, morbidly obese male in no acute distress; appearance consistent with age of record HENT: normocephalic; atraumatic Eyes: pupils equal, round and reactive to light; extraocular muscles intact Neck: supple Heart: regular rate and rhythm Lungs: clear to auscultation bilaterally Abdomen: soft; obese; right upper quadrant tenderness; bowel sounds present Extremities: No deformity; full range of motion Neurologic: Awake, alert and oriented; motor function intact in all extremities and symmetric; no facial droop Skin: Warm and dry Psychiatric: Mildly agitated    ED Course  Procedures  (including critical care time)  MDM   Nursing notes and vitals signs, including pulse oximetry, reviewed.  Summary of this visit's results, reviewed by myself:  Labs:  Results for orders placed during the hospital encounter of 11/16/12 (from the past 24 hour(s))  URINALYSIS, ROUTINE W REFLEX MICROSCOPIC     Status: Abnormal   Collection Time    11/16/12 11:33 PM      Result Value Range   Color, Urine YELLOW  YELLOW   APPearance CLEAR  CLEAR   Specific Gravity, Urine 1.009  1.005 - 1.030   pH 8.5 (*) 5.0 - 8.0   Glucose, UA NEGATIVE  NEGATIVE mg/dL   Hgb urine dipstick NEGATIVE  NEGATIVE   Bilirubin Urine NEGATIVE  NEGATIVE   Ketones, ur NEGATIVE  NEGATIVE mg/dL   Protein, ur NEGATIVE  NEGATIVE mg/dL   Urobilinogen, UA 1.0  0.0 - 1.0 mg/dL   Nitrite NEGATIVE  NEGATIVE   Leukocytes, UA NEGATIVE  NEGATIVE  LIPASE, BLOOD     Status: None   Collection Time    11/17/12  1:40 AM      Result Value Range   Lipase 25  11 - 59 U/L  COMPREHENSIVE METABOLIC PANEL     Status: Abnormal   Collection Time    11/17/12  1:40 AM      Result Value Range   Sodium 135  135 - 145 mEq/L   Potassium 4.2  3.5 - 5.1 mEq/L   Chloride 96  96 - 112 mEq/L   CO2 27  19 - 32 mEq/L   Glucose, Bld 150 (*) 70 - 99 mg/dL   BUN 10  6 - 23 mg/dL   Creatinine, Ser 9.60  0.50 - 1.35 mg/dL   Calcium 45.4  8.4 - 09.8 mg/dL   Total Protein 8.5 (*) 6.0 - 8.3 g/dL   Albumin 3.7  3.5 - 5.2 g/dL   AST 38 (*) 0 - 37 U/L   ALT 46  0 - 53 U/L   Alkaline Phosphatase 77  39 - 117 U/L   Total Bilirubin 1.0  0.3 - 1.2 mg/dL   GFR calc non Af Amer >90  >90 mL/min   GFR calc Af Amer >90  >  90 mL/min  CBC WITH DIFFERENTIAL     Status: Abnormal   Collection Time    11/17/12  1:40 AM      Result Value Range   WBC 12.2 (*) 4.0 - 10.5 K/uL   RBC 5.64  4.22 - 5.81 MIL/uL   Hemoglobin 16.8  13.0 - 17.0 g/dL   HCT 16.1  09.6 - 04.5 %   MCV 86.3  78.0 - 100.0 fL   MCH 29.8  26.0 - 34.0 pg   MCHC 34.5  30.0 - 36.0 g/dL    RDW 40.9  81.1 - 91.4 %   Platelets 283  150 - 400 K/uL   Neutrophils Relative % 71  43 - 77 %   Neutro Abs 8.6 (*) 1.7 - 7.7 K/uL   Lymphocytes Relative 22  12 - 46 %   Lymphs Abs 2.7  0.7 - 4.0 K/uL   Monocytes Relative 6  3 - 12 %   Monocytes Absolute 0.7  0.1 - 1.0 K/uL   Eosinophils Relative 1  0 - 5 %   Eosinophils Absolute 0.2  0.0 - 0.7 K/uL   Basophils Relative 0  0 - 1 %   Basophils Absolute 0.0  0.0 - 0.1 K/uL   EKG Interpretation:  Date & Time: 11/16/2012 9:48 PM  Rate: 91  Rhythm: normal sinus rhythm  QRS Axis: right  Intervals: normal  ST/T Wave abnormalities: normal  Conduction Disutrbances:none  Narrative Interpretation:   Old EKG Reviewed: none available  2:24 AM Patient's symptomatology and exam are concerning for acute cholecystitis. At ultrasound is not available here we will transfer him to [facility] ED for ultrasound and disposition based on findings.     Hanley Seamen, MD 11/17/12 [phone]

## 2012-11-17 NOTE — ED Notes (Signed)
Pt pulled out his IV due to getting up to use the bathroom by scooting out the bottom of the stretcher; call bell was on the stretcher with pt and when asked the pt why he didn't use the call bell to get assistance pt stated "I didn't need any"; myself and Tramaine, EMT cleaned blood droppings on floor and stretcher; pt washed his hand and Tramaine, EMT placed gauze and tape on pt's hand where IV was; RN notified

## 2012-11-17 NOTE — ED Provider Notes (Signed)
Medical screening examination/treatment/procedure(s) were performed by non-physician practitioner and as supervising physician I was immediately available for consultation/collaboration.  Shon Baton, MD 11/17/12 [phone]

## 2012-11-17 NOTE — ED Notes (Signed)
Pt requesting rx for pain medication. PA reporting to patient he will not be provided this. He needs to follow up with his PCP. Pt's grandma coming to pick him up. Pt ambulatory independently to discharge.

## 2013-02-24 ENCOUNTER — Emergency Department (HOSPITAL_COMMUNITY)
Admission: EM | Admit: 2013-02-24 | Discharge: 2013-02-24 | Discharge status: Against Medical Advice | Payer: Medicare Other | Attending: Emergency Medicine | Admitting: Emergency Medicine

## 2013-02-24 ENCOUNTER — Encounter (HOSPITAL_COMMUNITY): Payer: Self-pay | Admitting: Emergency Medicine

## 2013-02-24 DIAGNOSIS — Z79899 Other long term (current) drug therapy: Secondary | ICD-10-CM | POA: Insufficient documentation

## 2013-02-24 DIAGNOSIS — J45909 Unspecified asthma, uncomplicated: Secondary | ICD-10-CM | POA: Insufficient documentation

## 2013-02-24 DIAGNOSIS — F909 Attention-deficit hyperactivity disorder, unspecified type: Secondary | ICD-10-CM | POA: Insufficient documentation

## 2013-02-24 DIAGNOSIS — F329 Major depressive disorder, single episode, unspecified: Secondary | ICD-10-CM

## 2013-02-24 DIAGNOSIS — F319 Bipolar disorder, unspecified: Secondary | ICD-10-CM | POA: Insufficient documentation

## 2013-02-24 DIAGNOSIS — F17200 Nicotine dependence, unspecified, uncomplicated: Secondary | ICD-10-CM | POA: Insufficient documentation

## 2013-02-24 DIAGNOSIS — E119 Type 2 diabetes mellitus without complications: Secondary | ICD-10-CM | POA: Insufficient documentation

## 2013-02-24 DIAGNOSIS — M545 Low back pain, unspecified: Secondary | ICD-10-CM | POA: Insufficient documentation

## 2013-02-24 DIAGNOSIS — I10 Essential (primary) hypertension: Secondary | ICD-10-CM | POA: Insufficient documentation

## 2013-02-24 DIAGNOSIS — Z791 Long term (current) use of non-steroidal anti-inflammatories (NSAID): Secondary | ICD-10-CM | POA: Insufficient documentation

## 2013-02-24 DIAGNOSIS — IMO0002 Reserved for concepts with insufficient information to code with codable children: Secondary | ICD-10-CM | POA: Insufficient documentation

## 2013-02-24 DIAGNOSIS — E785 Hyperlipidemia, unspecified: Secondary | ICD-10-CM | POA: Insufficient documentation

## 2013-02-24 DIAGNOSIS — G8929 Other chronic pain: Secondary | ICD-10-CM

## 2013-02-24 LAB — COMPREHENSIVE METABOLIC PANEL
ALT: 31 U/L (ref 0–53)
AST: 33 U/L (ref 0–37)
Albumin: 3.4 g/dL — ABNORMAL LOW (ref 3.5–5.2)
Alkaline Phosphatase: 72 U/L (ref 39–117)
BUN: 7 mg/dL (ref 6–23)
CO2: 24 mEq/L (ref 19–32)
Chloride: 97 mEq/L (ref 96–112)
Creatinine, Ser: 0.73 mg/dL (ref 0.50–1.35)
GFR calc non Af Amer: >90 mL/min (ref >90)
Sodium: 132 mEq/L — ABNORMAL LOW (ref 135–145)
Total Bilirubin: 0.4 mg/dL (ref 0.3–1.2)

## 2013-02-24 LAB — CBC
HCT: 48.1 % (ref 39.0–52.0)
MCV: 89.9 fL (ref 78.0–100.0)
Platelets: 272 10*3/uL (ref 150–400)
RBC: 5.35 MIL/uL (ref 4.22–5.81)
RDW: 13.8 % (ref 11.5–15.5)
WBC: 15.2 10*3/uL — ABNORMAL HIGH (ref 4.0–10.5)

## 2013-02-24 LAB — ETHANOL: Alcohol, Ethyl (B): <11 mg/dL (ref 0–11)

## 2013-02-24 MED ORDER — LORAZEPAM 1 MG PO TABS
1.0000 mg | ORAL_TABLET | Freq: Once | ORAL | Status: AC
  Administered 2013-02-24: 1 mg via ORAL
  Filled 2013-02-24: qty 1

## 2013-02-24 NOTE — ED Notes (Signed)
Pt states that he needs help with depression.  Denies SI/HI.  States that the christmas season is stressful and he is sick of crying.

## 2013-02-24 NOTE — ED Provider Notes (Signed)
CSN: [id number]     Arrival date & time 02/24/13  1407 History   First MD Initiated Contact with Patient 02/24/13 1506     Chief Complaint  Patient presents with  . Depression   (Consider location/radiation/quality/duration/timing/severity/associated sxs/prior Treatment) HPI Comments: Patient is a 37 year old male with a PMH history of depression, HTN, DM, Hyperlipidemia, morbid obesity and chronic pain presents to the ED with complaints of worsening depression despite the medications that he is currently on.  He states that he was "locked out of his house" over the holidays when his landlord decided to sell the house.  He states that Christmas is also a hard time for him.  He states no medication for the past 3 days despite knowing that his landlord would lock the door.  He states he will be unable to get his medication until Monday.  He denies suicidal or homicidal ideation and states "I could never do that".  He has not followed up with his PCP.  He is immediately requesting his pain medication which he has not had.  I have informed him that we will continue all his home medication but not the pain medication.  He states that he no longer wishes to stay.  The history is provided by the patient. No language interpreter was used.    Past Medical History  Diagnosis Date  . Heart valve disorder   . Hypertension   . Asthma   . Diabetes mellitus   . Hyperlipidemia   . Anaphylactic reaction   . Bipolar disorder   . Adult ADHD   . Morbidly obese    Past Surgical History  Procedure Laterality Date  . Carpal tunnel release     No family history on file. History  Substance Use Topics  . Smoking status: Current Every Day Smoker -- 1.00 packs/day for 23 years    Types: Cigarettes  . Smokeless tobacco: Not on file  . Alcohol Use: No    Review of Systems  All other systems reviewed and are negative.    Allergies  Atenolol; Other; and Tylenol  Home Medications   Current Outpatient  Rx  Name  Route  Sig  Dispense  Refill  . ALPRAZolam (XANAX) 1 MG tablet   Oral   Take 1 mg by mouth 5 (five) times daily.         Marland Kitchen amphetamine-dextroamphetamine (ADDERALL) 20 MG tablet   Oral   Take 40 mg by mouth daily.         Marland Kitchen azithromycin (ZITHROMAX) 250 MG tablet   Oral   Take 250 mg by mouth daily.          . citalopram (CELEXA) 10 MG tablet   Oral   Take 10 mg by mouth daily.          . cloNIDine (CATAPRES) 0.2 MG tablet   Oral   Take 0.2 mg by mouth every morning.          . Fluticasone-Salmeterol (ADVAIR) 250-50 MCG/DOSE AEPB   Inhalation   Inhale 1 puff into the lungs every 12 (twelve) hours.         . folic acid (FOLVITE) 1 MG tablet   Oral   Take 1 mg by mouth daily.         Marland Kitchen lisinopril (PRINIVIL,ZESTRIL) 20 MG tablet   Oral   Take 20 mg by mouth daily.         . naproxen (NAPROSYN) 500 MG tablet  Oral   Take 500 mg by mouth 2 (two) times daily with a meal.          . OLANZapine zydis (ZYPREXA) 10 MG disintegrating tablet   Oral   Take 10 mg by mouth daily.          Marland Kitchen omeprazole (PRILOSEC) 40 MG capsule   Oral   Take 40 mg by mouth daily.          Marland Kitchen oxycodone (ROXICODONE) 30 MG immediate release tablet   Oral   Take 30 mg by mouth every 4 (four) hours as needed. For pain.         Marland Kitchen oxyCODONE (ROXICODONE) 5 MG immediate release tablet   Oral   Take 1-2 tablets (5-10 mg total) by mouth every 4 (four) hours as needed for pain.   15 tablet   0   . pravastatin (PRAVACHOL) 40 MG tablet   Oral   Take 40 mg by mouth daily.          . promethazine (PHENERGAN) 25 MG tablet      25 mg every 6 (six) hours as needed.          . sitaGLIPtan-metformin (JANUMET) 50-500 MG per tablet   Oral   Take 1 tablet by mouth 2 (two) times daily with a meal.         . terbinafine (LAMISIL) 250 MG tablet   Oral   Take 250 mg by mouth daily.          Marland Kitchen zolpidem (AMBIEN) 10 MG tablet   Oral   Take 10 mg by mouth at bedtime as  needed for sleep.          BP 130/73  Pulse 91  Temp(Src) 98.4 F (36.9 C) (Oral)  Resp 20  SpO2 95% Physical Exam  Nursing note and vitals reviewed. Constitutional: He is oriented to person, place, and time. He appears well-developed and well-nourished. No distress.  HENT:  Head: Normocephalic and atraumatic.  Right Ear: External ear normal.  Left Ear: External ear normal.  Nose: Nose normal.  Mouth/Throat: Oropharynx is clear and moist. No oropharyngeal exudate.  Eyes: Conjunctivae are normal. Pupils are equal, round, and reactive to light. No scleral icterus.  Neck: Normal range of motion. Neck supple.  Cardiovascular: Normal rate, regular rhythm and normal heart sounds.  Exam reveals no gallop and no friction rub.   No murmur heard. Pulmonary/Chest: Effort normal and breath sounds normal. No respiratory distress. He has no wheezes. He has no rales. He exhibits no tenderness.  Abdominal: Soft. Bowel sounds are normal. He exhibits no distension. There is no tenderness.  Musculoskeletal: Normal range of motion. He exhibits tenderness. He exhibits no edema.  Mild lumbar paraspinal tenderness  Lymphadenopathy:    He has no cervical adenopathy.  Neurological: He is alert and oriented to person, place, and time. He exhibits normal muscle tone. Coordination normal.  Skin: Skin is warm and dry. No rash noted. No erythema. No pallor.  Psychiatric: He has a normal mood and affect. His behavior is normal. Judgment and thought content normal.  Denies suicidal or homicidal ideation, makes appropriate eye contact, mood appears normal though reports is depressed, speech normal, affect normal    ED Course  Procedures (including critical care time) Labs Review Labs Reviewed  CBC - Abnormal; Notable for the following:    WBC 15.2 (*)    All other components within normal limits  COMPREHENSIVE METABOLIC PANEL  URINE RAPID DRUG  SCREEN (HOSP PERFORMED)  ETHANOL   Imaging Review No  results found.  EKG Interpretation   None      Results for orders placed during the hospital encounter of 02/24/13  CBC      Result Value Range   WBC 15.2 (*) 4.0 - 10.5 K/uL   RBC 5.35  4.22 - 5.81 MIL/uL   Hemoglobin 16.6  13.0 - 17.0 g/dL   HCT 08.6  57.8 - 46.9 %   MCV 89.9  78.0 - 100.0 fL   MCH 31.0  26.0 - 34.0 pg   MCHC 34.5  30.0 - 36.0 g/dL   RDW 62.9  52.8 - 41.3 %   Platelets 272  150 - 400 K/uL   No results found.    MDM  Depression  Patient here with worsening of depression wtihout suicidal or homicidal ideation, when I inform him we will not continue his chronic home pain medications here he has requested to leave.  He has normal thought pattern, normal eye contact and does not clinically appear to be in extremis.     Izola Price Marisue Humble, [place] 02/24/13 2440

## 2013-02-25 IMAGING — CT CT HEAD W/O CM
1 series · 16 of 30 positions shown, 20 images · non-contrast
Comparison: 08/26/2010

CLINICAL DATA: Weakness.  Numbness and tingling

CT HEAD WITHOUT CONTRAST
TECHNIQUE: Contiguous axial images were obtained from the base of
the skull through the vertex without contrast.

[Series 2: head 4.8 h37s · axial · 0.46mm/px · z∈[-191,-35]mm · 16 of 36 slices shown, 20 images]
[im 2/36  brain]
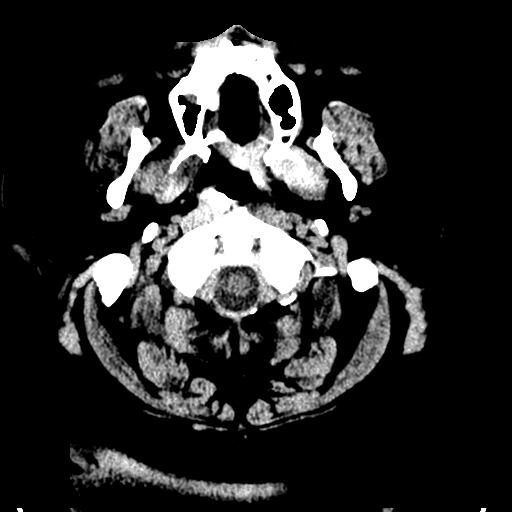
[im 2/36  bone]
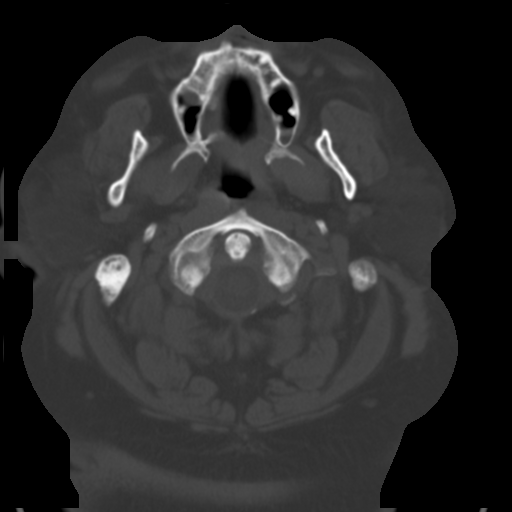
[im 4/36  brain]
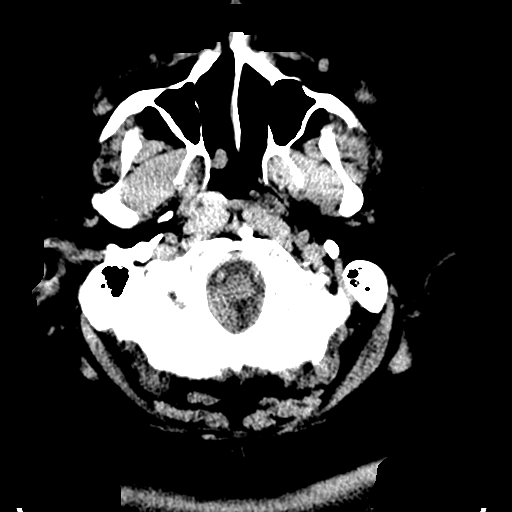
[im 7/36  brain]
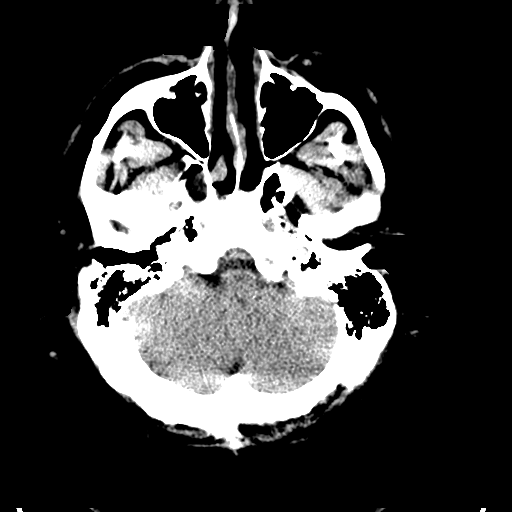
[im 9/36  brain]
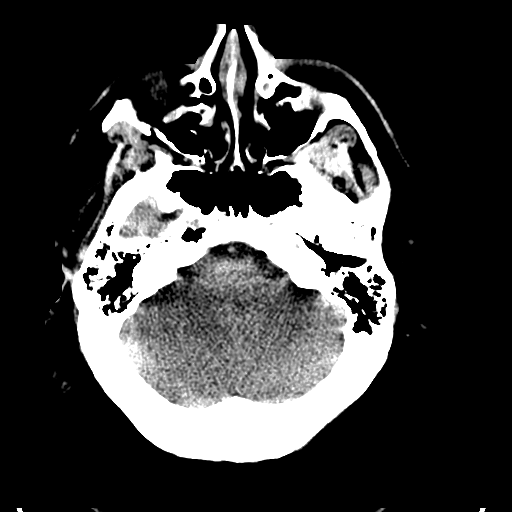
[im 10/36  brain]
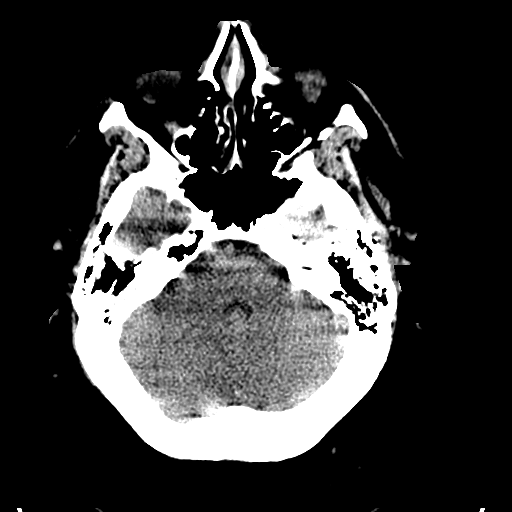
[im 10/36  bone]
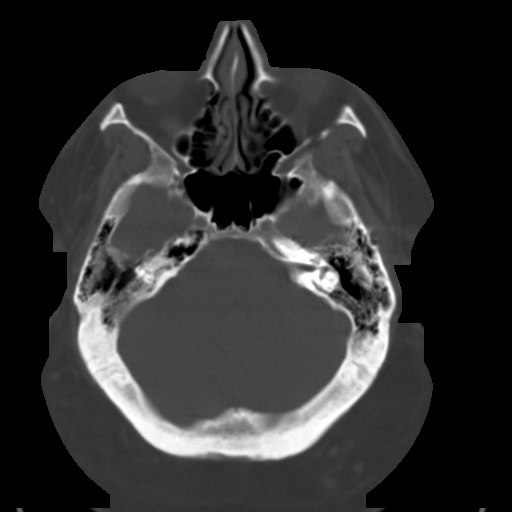
[im 13/36  brain]
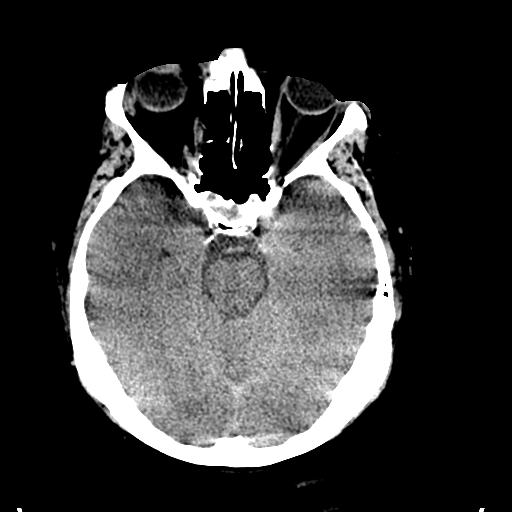
[im 15/36  brain]
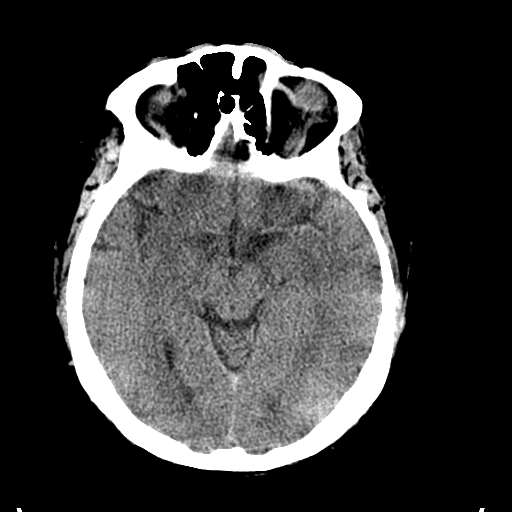
[im 17/36  brain]
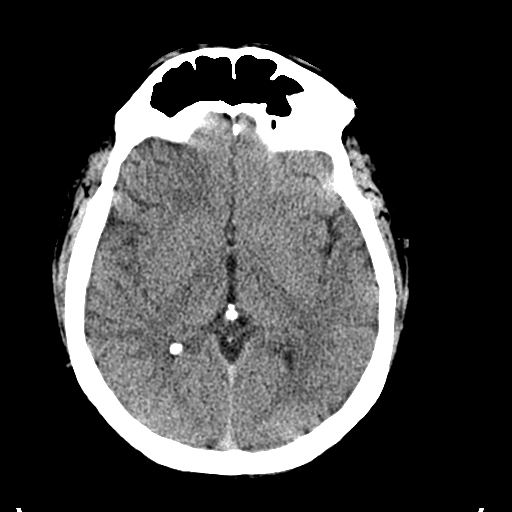
[im 19/36  brain]
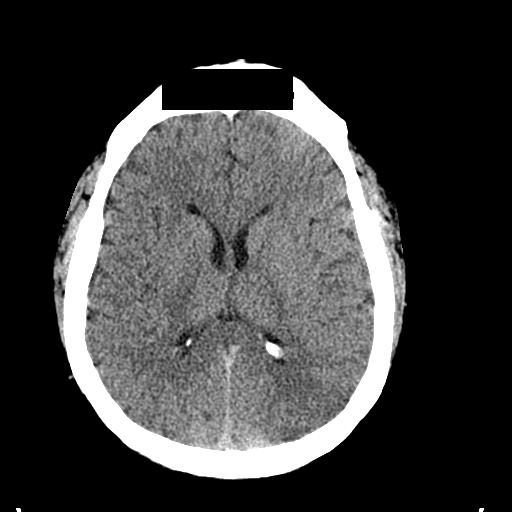
[im 19/36  bone]
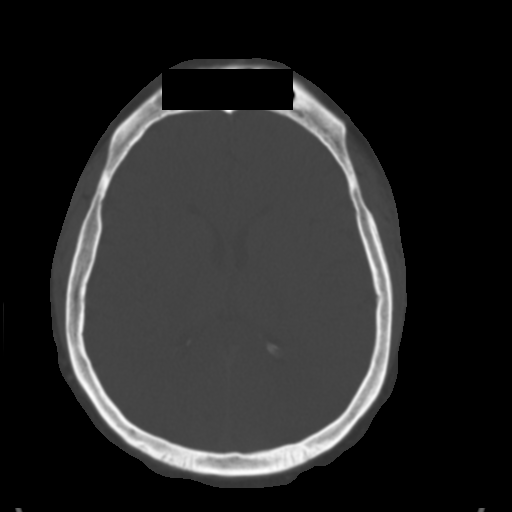
[im 21/36  brain]
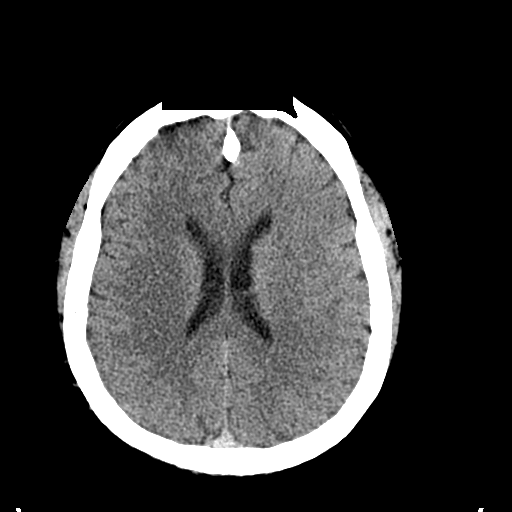
[im 23/36  brain]
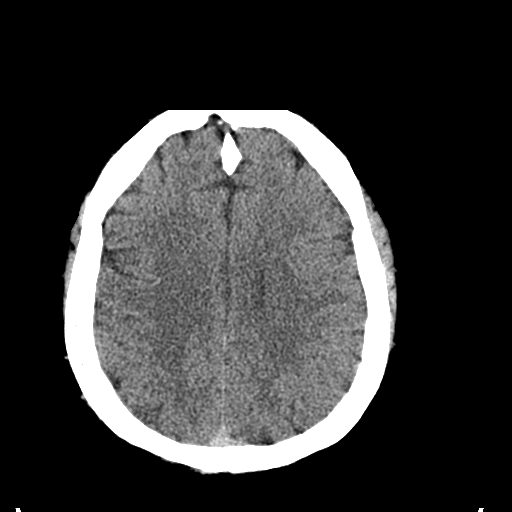
[im 26/36  brain]
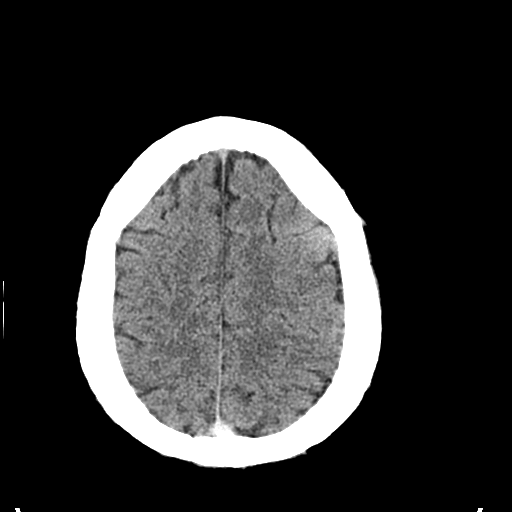
[im 27/36  brain]
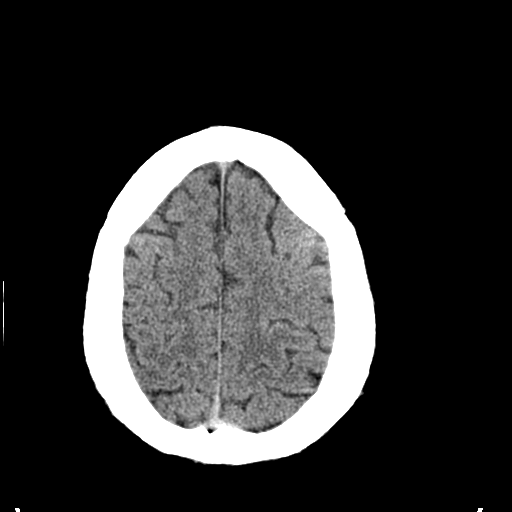
[im 27/36  bone]
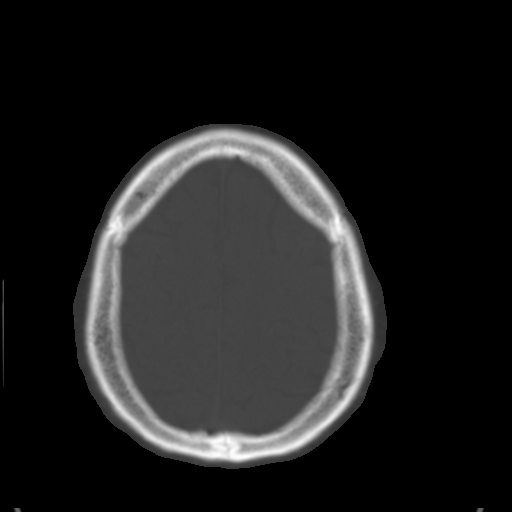
[im 29/36  brain]
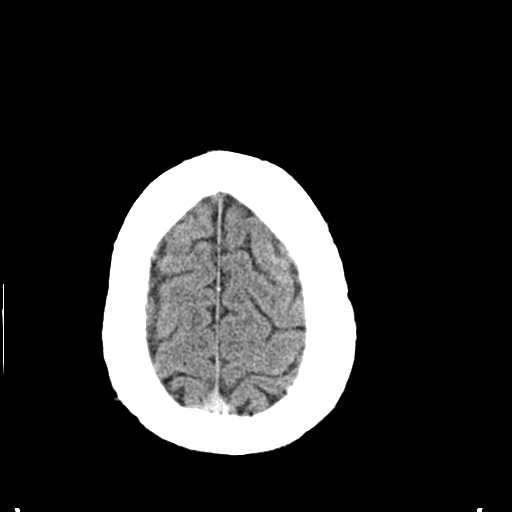
[im 32/36  brain]
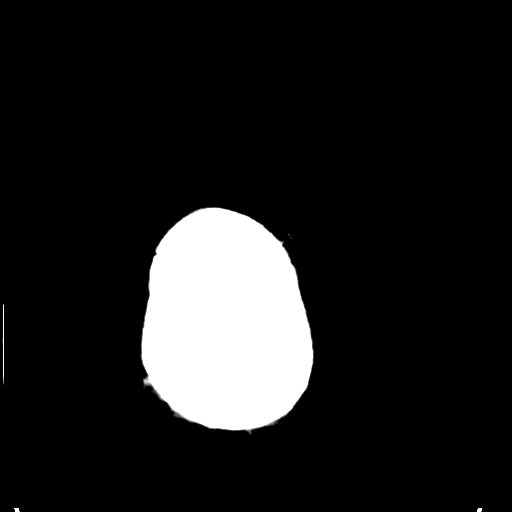
[im 34/36  brain]
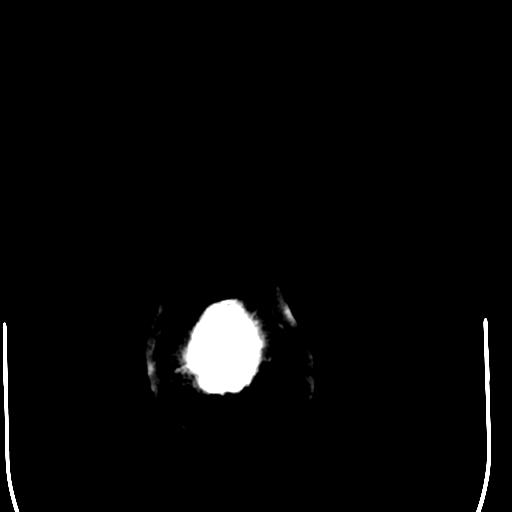

[16 of 30 positions shown; findings below may reference images not displayed]

FINDINGS: The brain has a normal appearance without evidence for hemorrhage,
infarction, hydrocephalus, or mass lesion.  There is no extra axial
fluid collection.  The skull and paranasal sinuses are normal.
IMPRESSION: 1.  No acute intracranial abnormalities.

## 2013-02-25 IMAGING — CR DG CHEST 2V
2 series · 2 of 2 positions shown · non-contrast
Comparison: 12/13/2010

CLINICAL DATA: , chest pain

CHEST - 2 VIEW

[w chest pa]
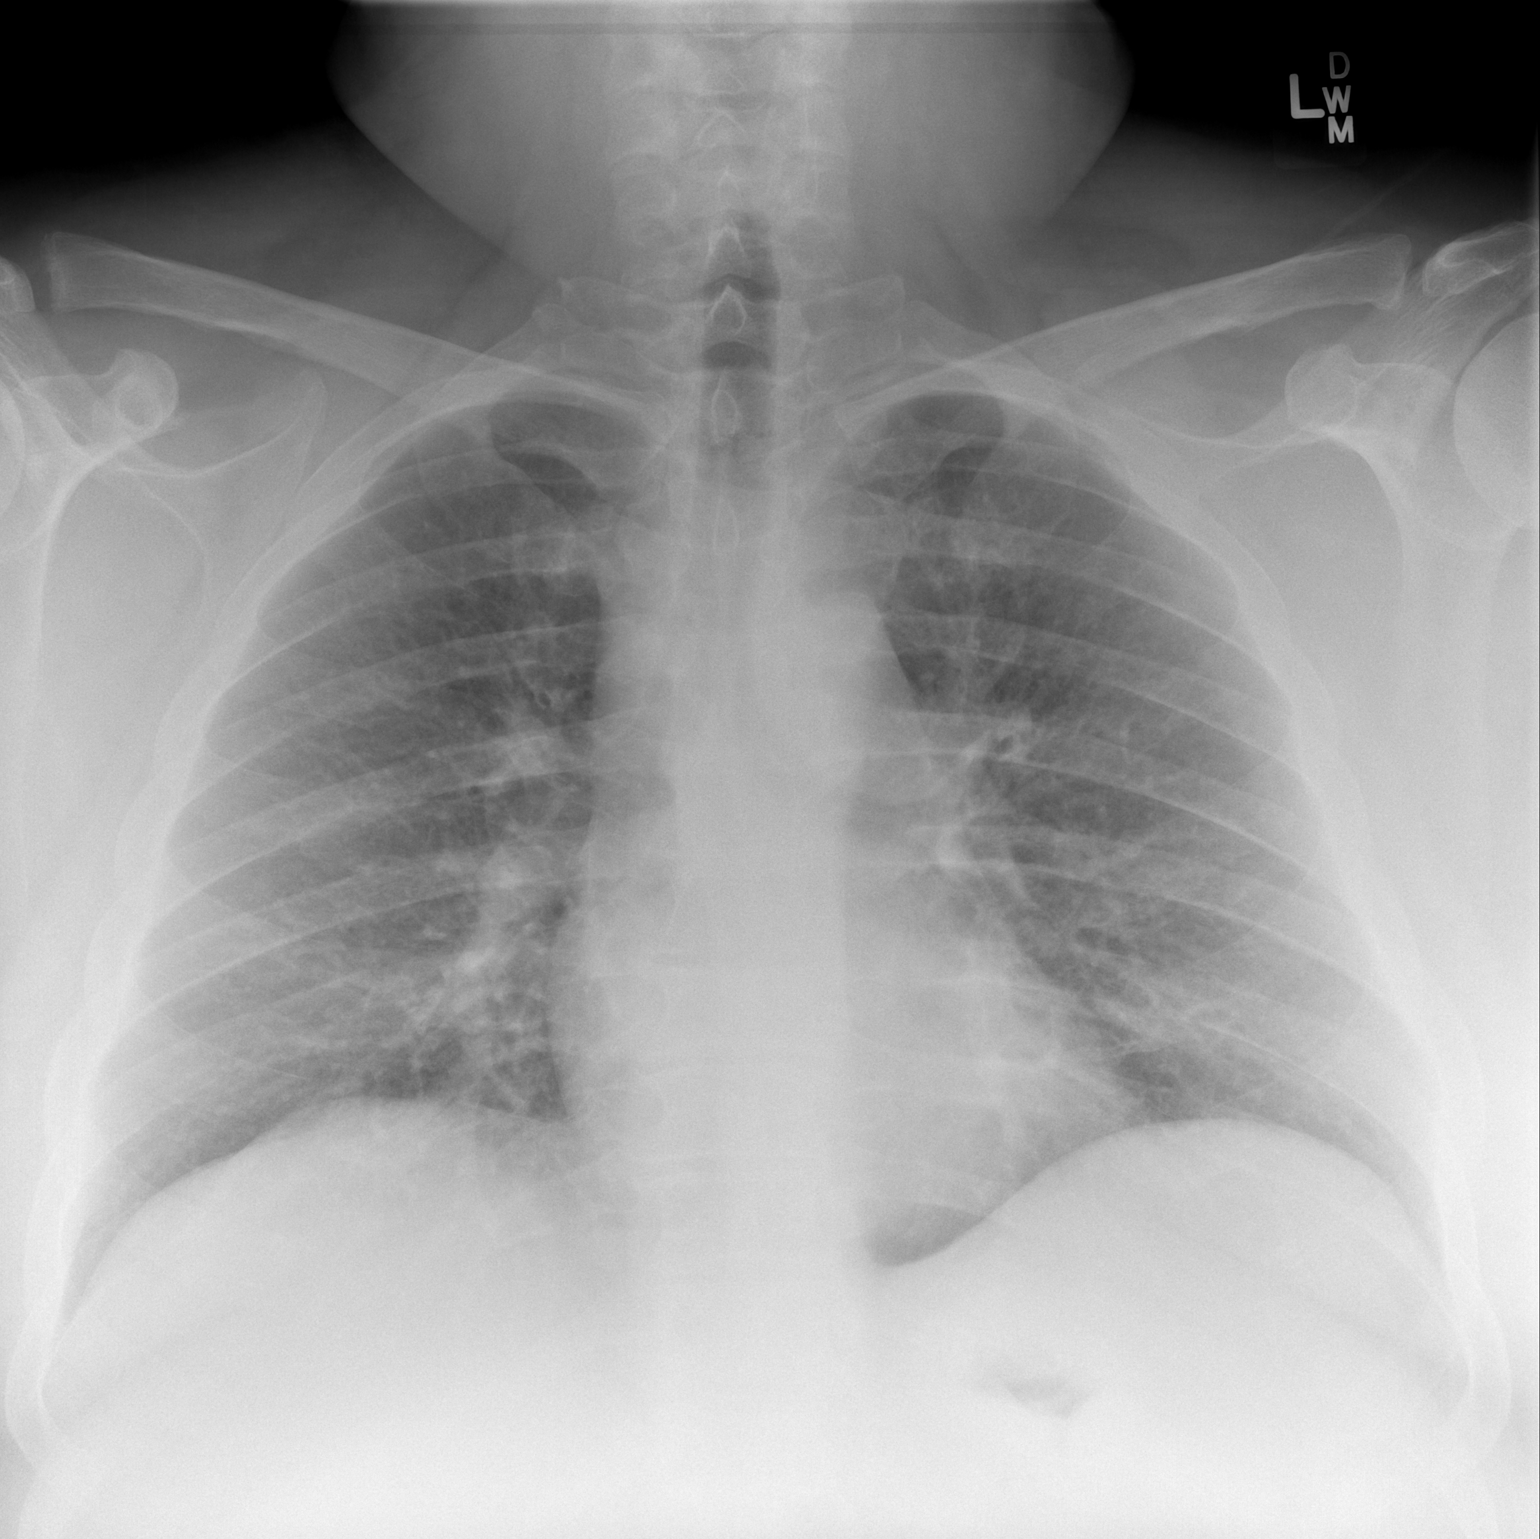

[w chest lat *]
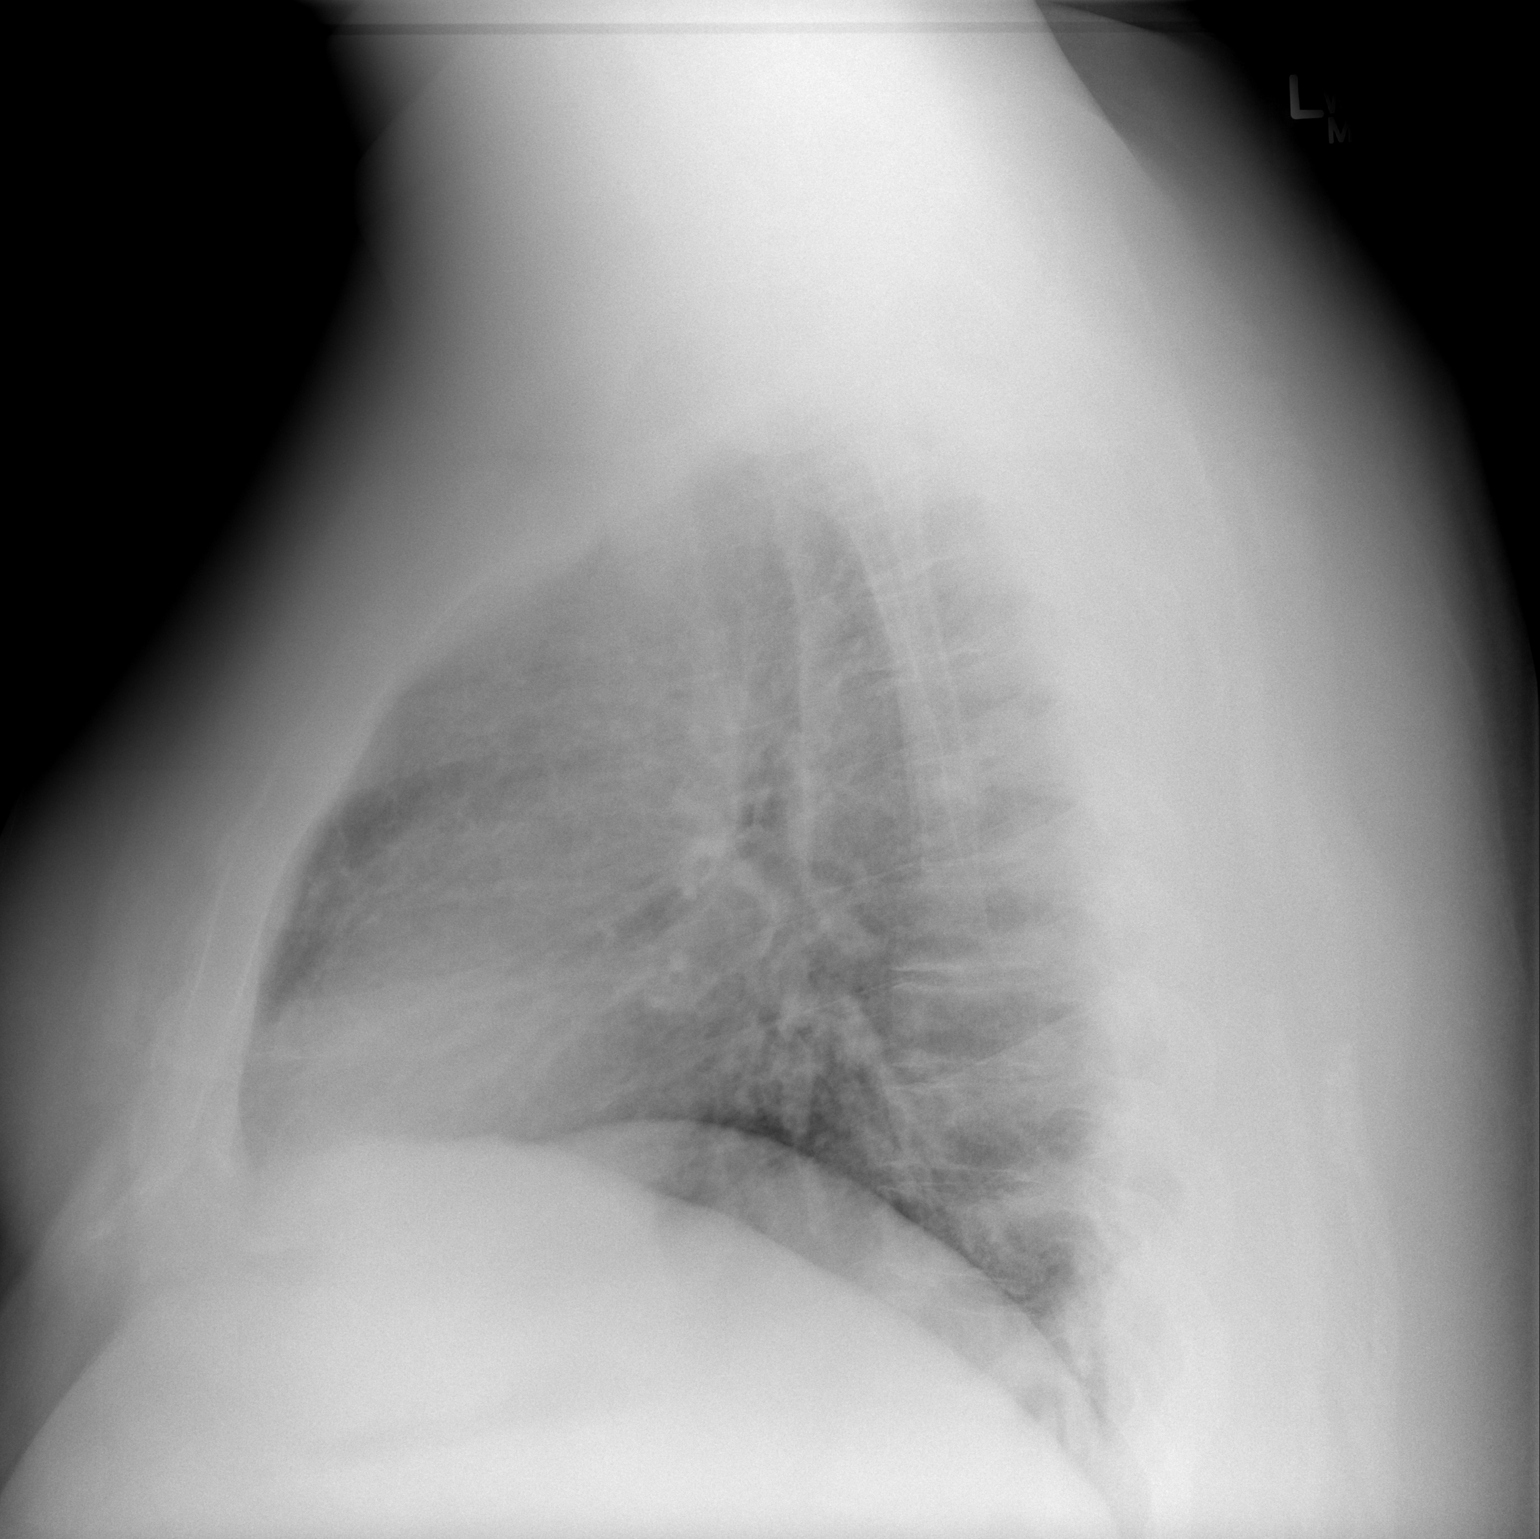

[2 of 2 positions shown; findings below may reference images not displayed]

FINDINGS: The heart size and mediastinal contours are within normal
limits.  Both lungs are clear.  The visualized skeletal structures
are unremarkable.
IMPRESSION: Negative exam.

## 2013-02-25 NOTE — ED Provider Notes (Signed)
Medical screening examination/treatment/procedure(s) were performed by non-physician practitioner and as supervising physician I was immediately available for consultation/collaboration.  EKG Interpretation   None         Megan E Docherty, MD 02/25/13 1244 

## 2013-03-18 IMAGING — CT CT ABD-PELV W/ CM
1 of 2 series · 14 of 32 positions shown, 18 images · IV contrast (omnipaque)
Comparison: Abdominal ultrasound - earlier same day; CT abdomen
pelvis - 12/13/2010

CLINICAL DATA: Right upper quadrant abdominal pain for 4 days,
nausea and vomiting

CT ABDOMEN AND PELVIS WITH CONTRAST
TECHNIQUE: Multidetector CT imaging of the abdomen and pelvis was
performed following the standard protocol during bolus
administration of intravenous contrast.
Contrast: 125mL OMNIPAQUE IOHEXOL 300 MG/ML  SOLN 125 ml Omnipaque-
300

[Series 2: abd/pel with · axial · 0.95mm/px · z∈[-326,+124]mm · 14 of 102 slices shown, 18 images]
[im 8/102  soft-tissue]
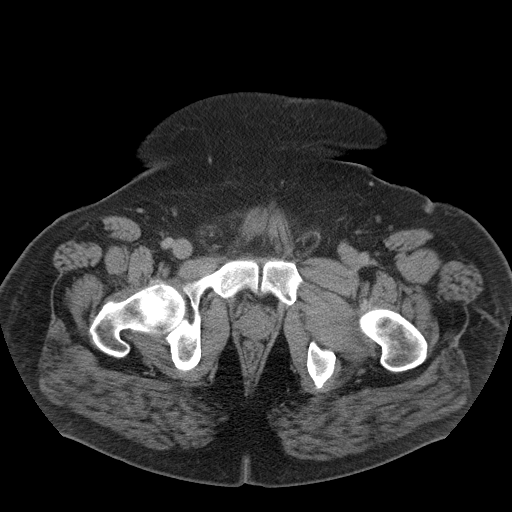
[im 8/102  bone]
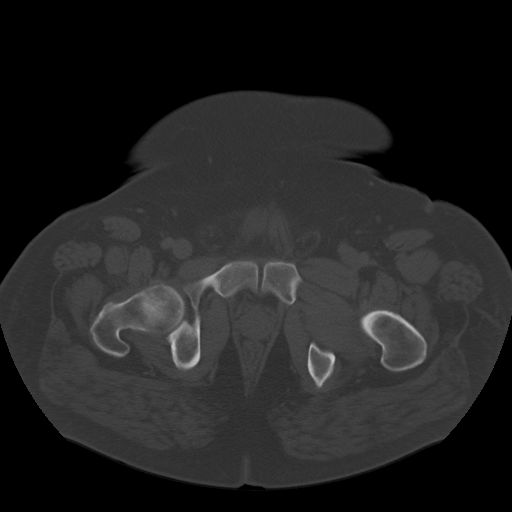
[im 16/102  soft-tissue]
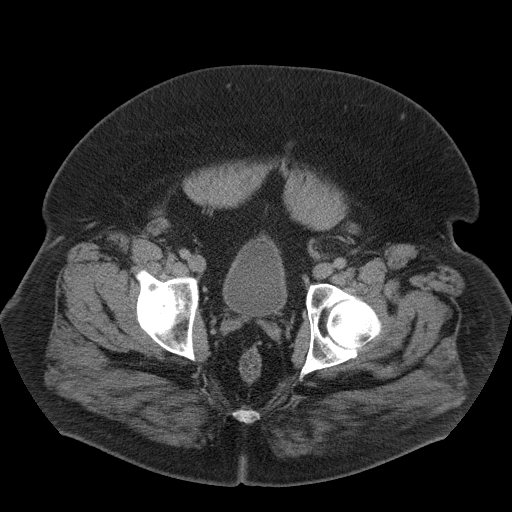
[im 24/102  soft-tissue]
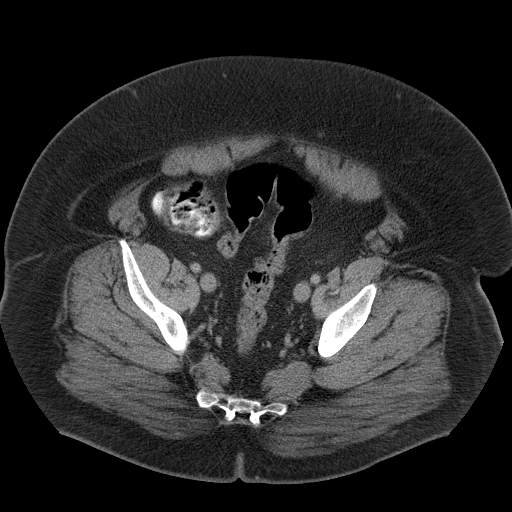
[im 32/102  soft-tissue]
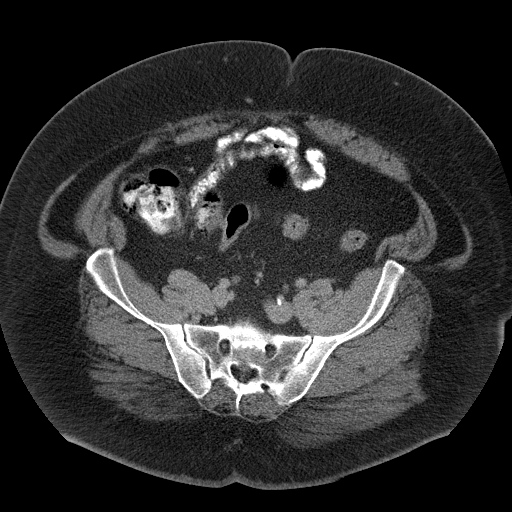
[im 39/102  soft-tissue]
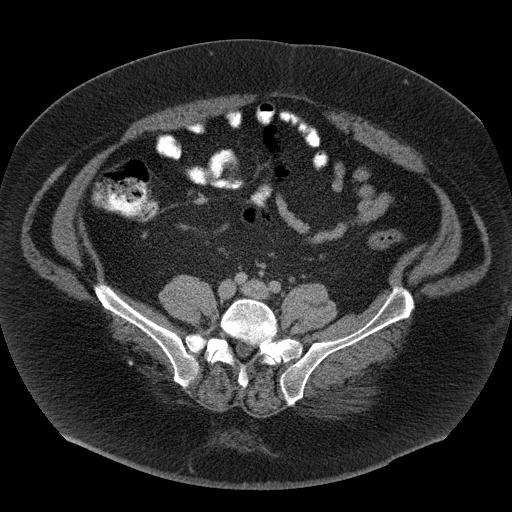
[im 47/102  soft-tissue]
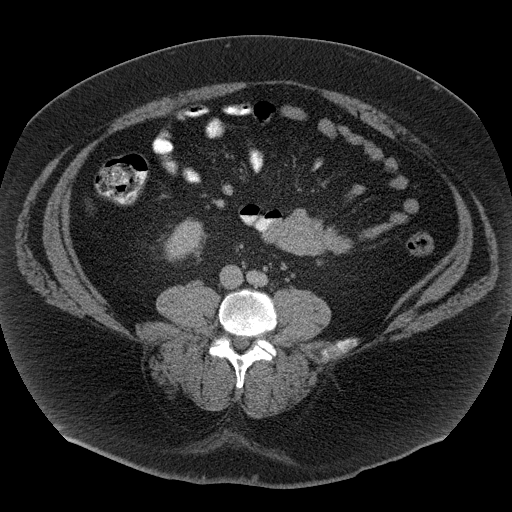
[im 55/102  soft-tissue]
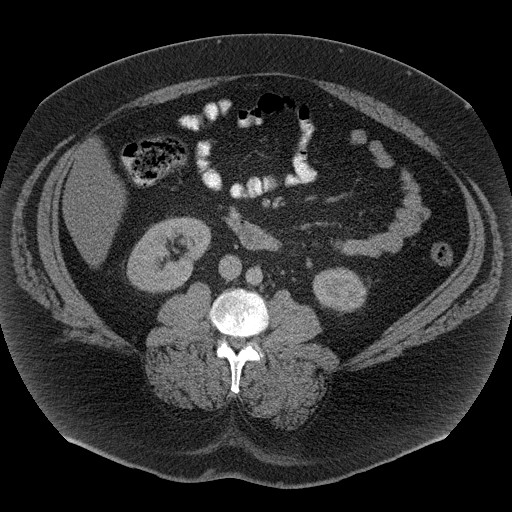
[im 63/102  soft-tissue]
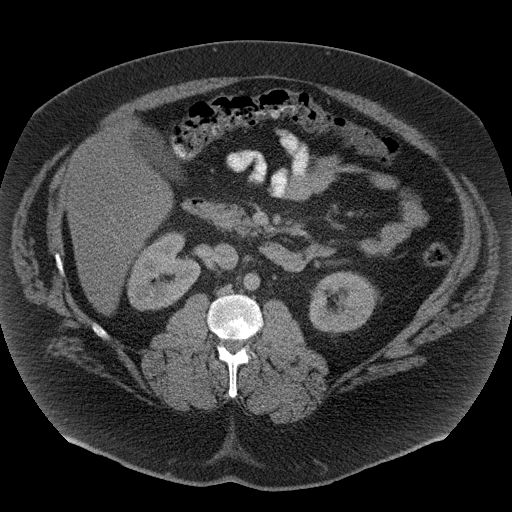
[im 70/102  soft-tissue]
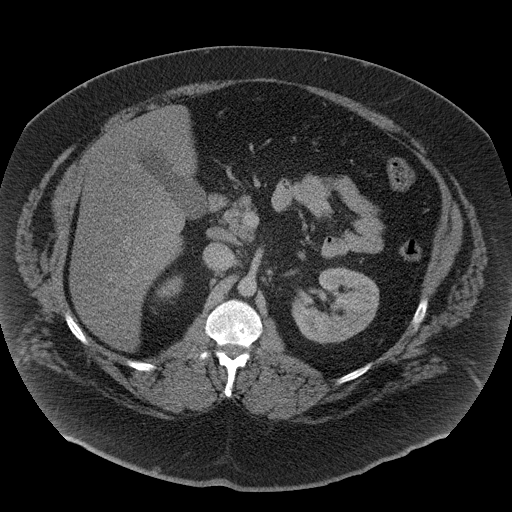
[im 70/102  bone]
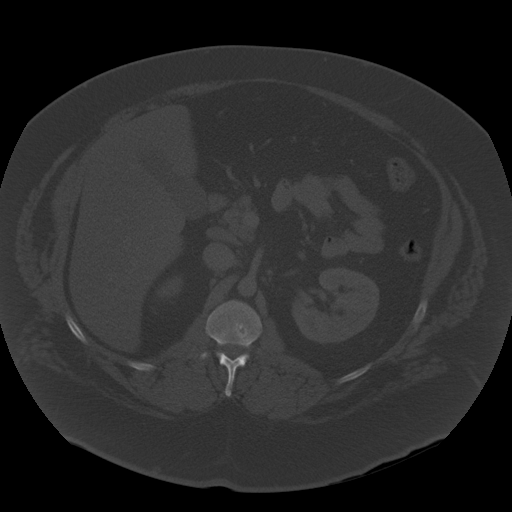
[im 78/102  soft-tissue]
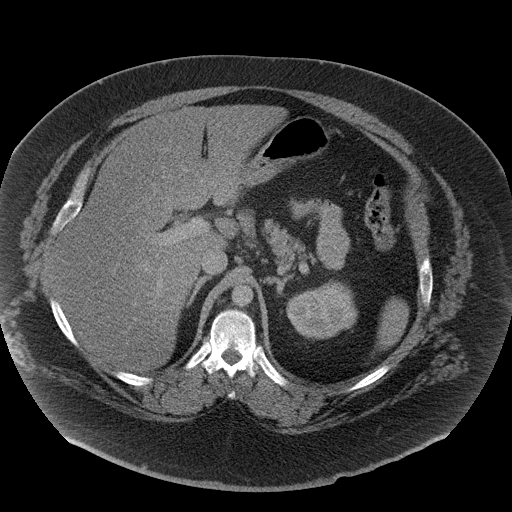
[im 86/102  soft-tissue]
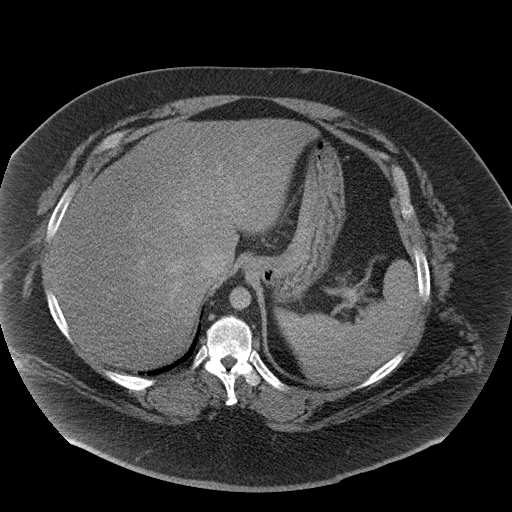
[im 86/102  lung]
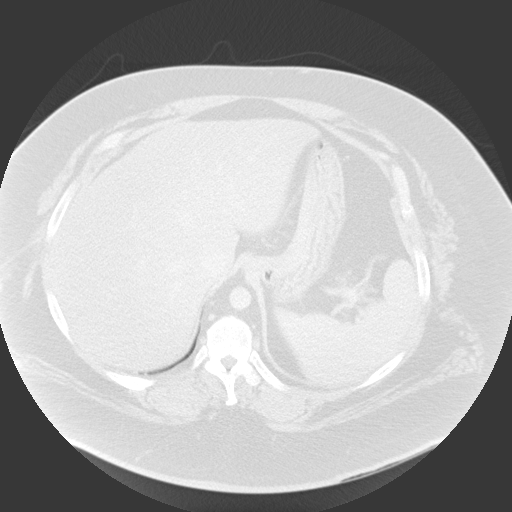
[im 90/102  lung]
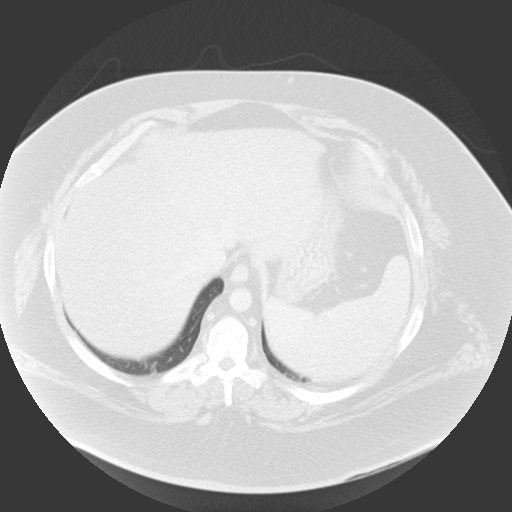
[im 94/102  soft-tissue]
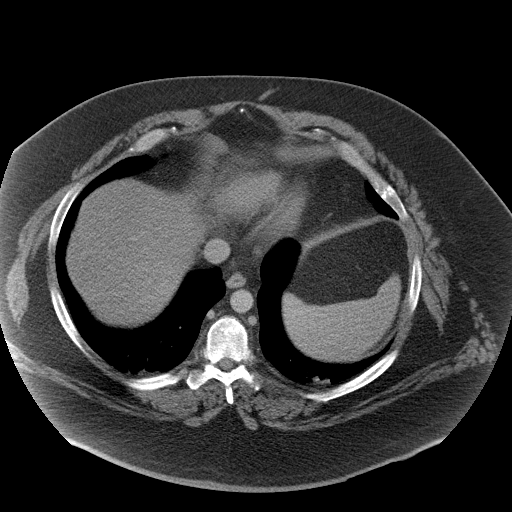
[im 94/102  lung]
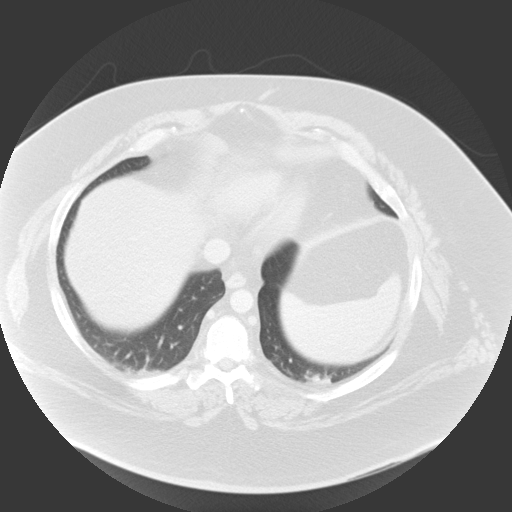
[im 98/102  lung]
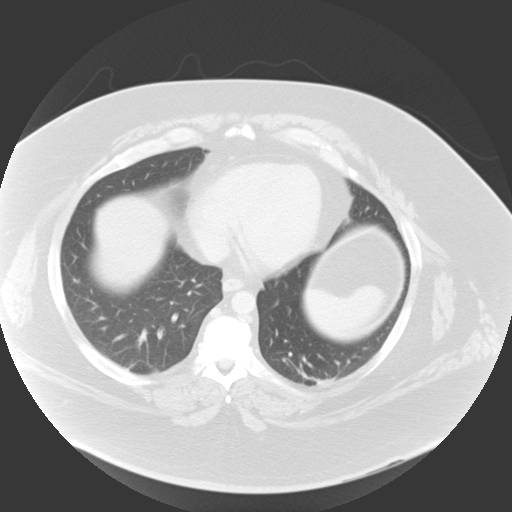

[14 of 32 positions shown; findings below may reference images not displayed]

FINDINGS: Normal hepatic contour.  There is overall decreased attenuation of
the hepatic parenchyma suggestive of hepatic steatosis.  No
discrete hepatic lesions.  Normal gallbladder.  No intrahepatic
biliary dilatation.  No ascites.

There is symmetric enhancement of the bilateral kidneys.  No
urinary obstruction.  Grossly symmetric minimal perinephric
stranding possibly secondary to patient body habitus.  Normal
appearance of the bilateral adrenal glands, pancreas and spleen.

Only a minimal amount of enteric contrast has been ingested however
extends to the level of the proximal transverse colon.  Scattered
colonic diverticulosis without evidence of diverticulitis.  The
sigmoid colon is noted to be redundant.  The bowel is otherwise
normal in course and caliber without wall thickening or evidence of
obstruction.  Normal appearance of a diminutive retrocecal appendix
(axial images 62 through 68).  No pneumoperitoneum, pneumatosis or
portal venous gas.  Normal caliber of the abdominal aorta.  No
retroperitoneal, mesenteric, pelvic or inguinal lymphadenopathy.

Pelvic organs are normal.  No free fluid within the pelvis.

Limited visualization of the lower thorax demonstrates minimal
bibasilar dependent atelectasis.  No focal airspace opacities or
pleural effusion.  Normal heart size.  No pericardial effusion.
Incidental note is made of several shotty subcentimeter pericardial
lymph nodes.

No acute or aggressive osseous abnormalities.  Minimal lumbar spine
degenerative change.
IMPRESSION: 1.  No acute findings within abdomen or pelvis.  Specifically, no
evidence of cholecystitis or urinary obstruction.
2.  Overall decreased attenuation to hepatic parenchyma suggestive
of hepatic steatosis.

3.  Minimal colonic diverticulosis without evidence of
diverticulitis.  Normal appendix.

## 2013-03-18 IMAGING — US US ABDOMEN COMPLETE
1 series · 13 of 25 positions shown · non-contrast
Comparison: CT of the abdomen and pelvis 12/13/2010

CLINICAL DATA: Right upper quadrant pain.  Vomiting, nausea.

COMPLETE ABDOMINAL ULTRASOUND

[Series 1: us abdomen complete · 0.47mm/px · 13 of 56 slices shown]
[im 1/56]
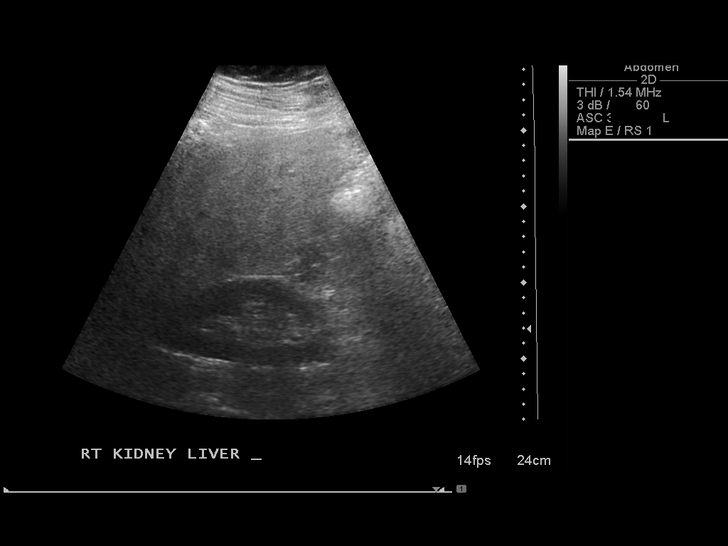
[im 5/56]
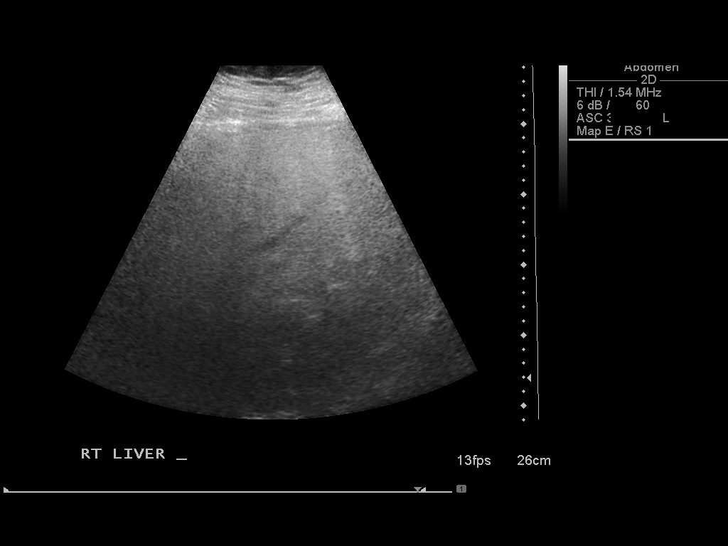
[im 10/56]
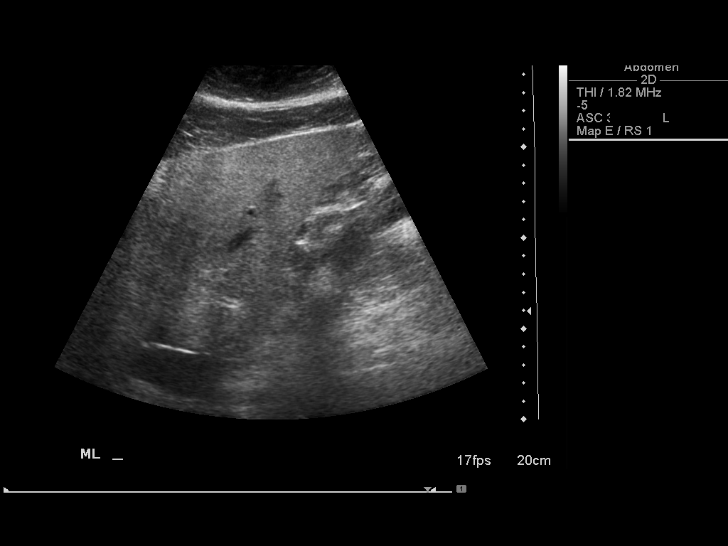
[im 14/56]
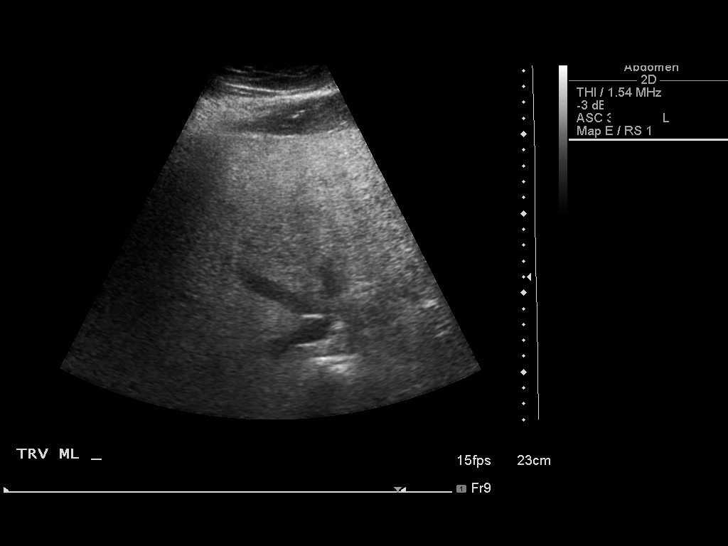
[im 19/56]
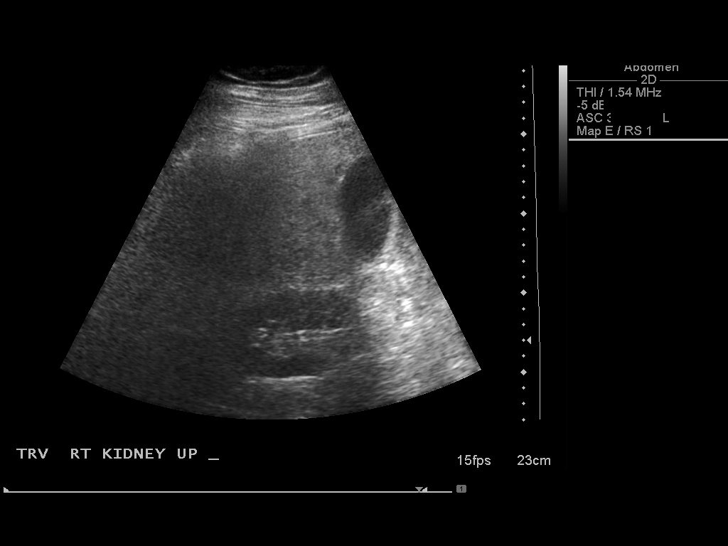
[im 23/56]
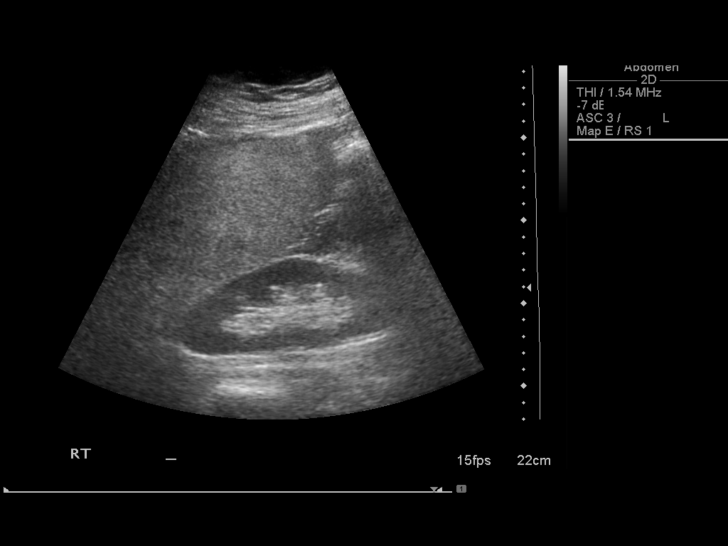
[im 28/56]
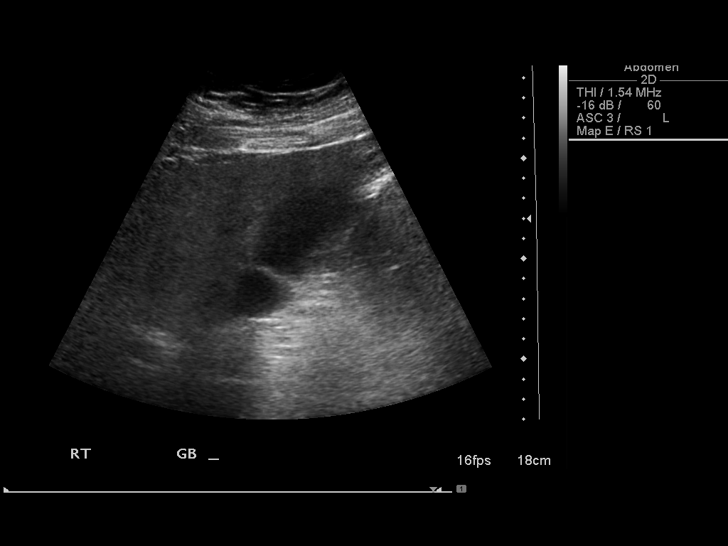
[im 33/56]
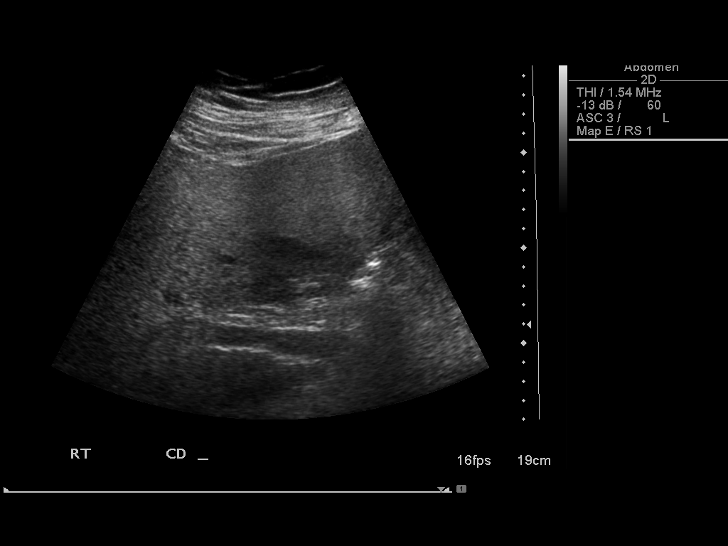
[im 37/56]
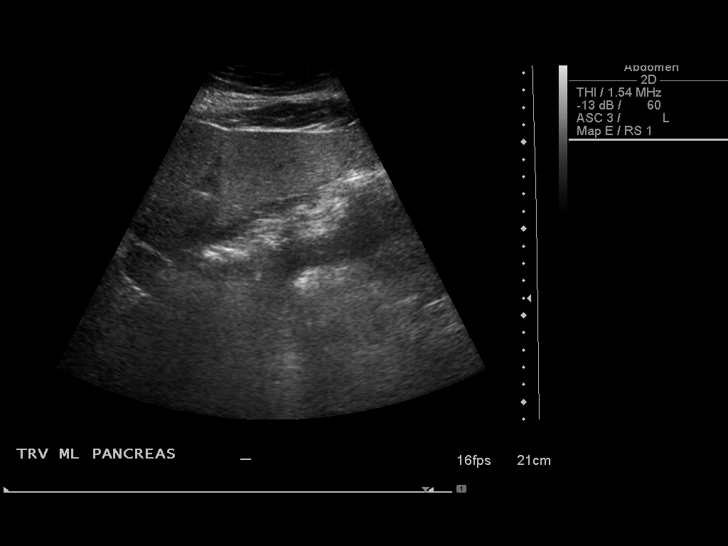
[im 42/56]
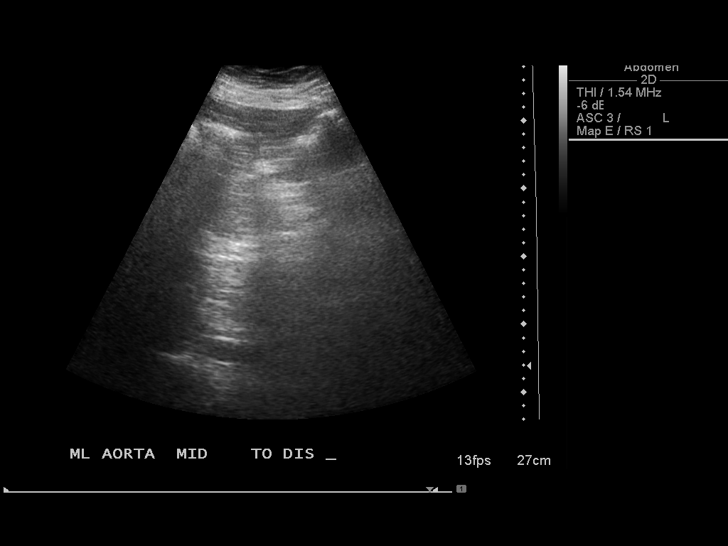
[im 46/56]
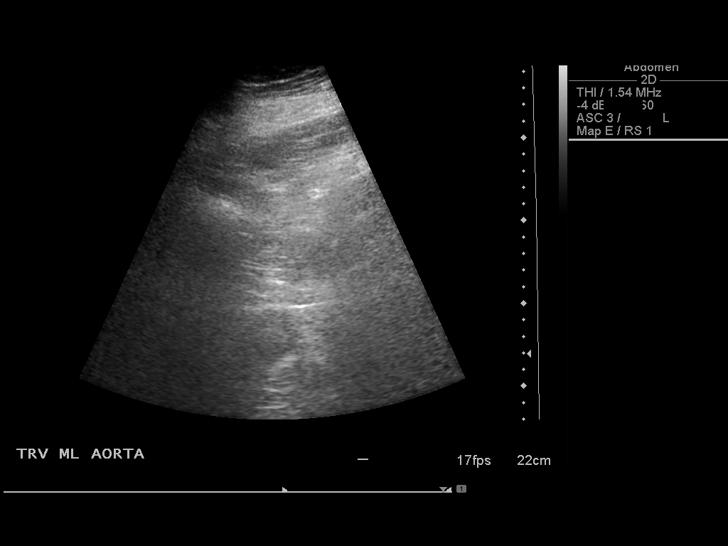
[im 51/56]
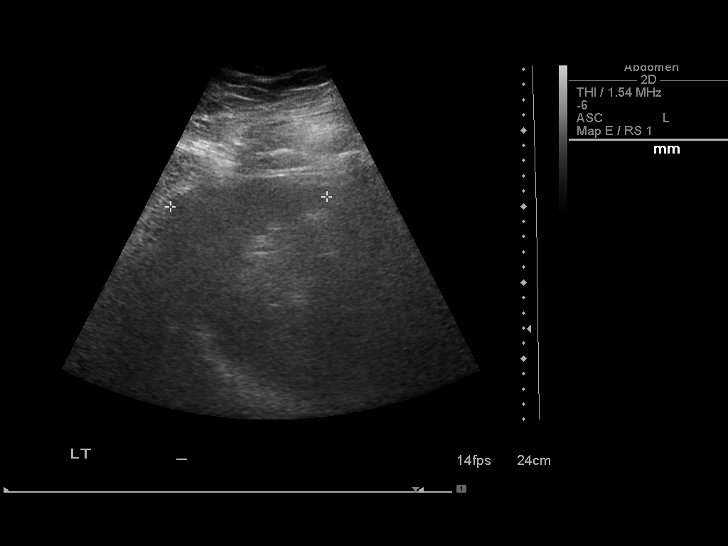
[im 56/56]
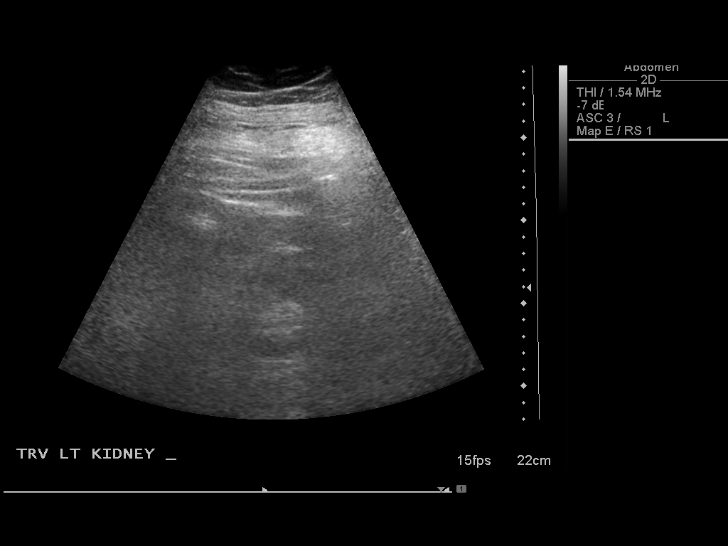

[13 of 25 positions shown; findings below may reference images not displayed]

FINDINGS: Gallbladder:  No gallstones, gallbladder wall thickening, or
pericholecystic fluid. Gallbladder wall is 2 mm in thickness.  No
sonographic Murphy's sign.

Common bile duct:  5 mm.

Liver:  The liver is echo dense.  There is poor delineation of
internal hepatic structures and poor visualization of the
diaphragm.  Evaluation of the right lobe is limited.

IVC:  Appears normal.

Pancreas:  The pancreas is not seen because of overlying bowel gas.

Spleen:  The spleen has a normal appearance, 10.2 cm in length.

Right Kidney:  Right kidney is 13.4 cm in length and has a normal
appearance.

Left Kidney:  The left kidney is 14.4 cm and is difficult to
visualize.

Abdominal aorta:  The visualized abdominal aorta is not aneurysmal.
The regions of the bifurcation and mid abdominal aorta are not well
seen because of overlying bowel gas and patient body habitus.

Additional findings:  Because of patient's body habitus, there is
limited resolution.
IMPRESSION: 1.  No evidence for acute cholecystitis.
2.  Fatty liver.
3.  No evidence for pelvicaliectasis

## 2013-03-27 ENCOUNTER — Emergency Department (HOSPITAL_COMMUNITY)
Admission: EM | Admit: 2013-03-27 | Discharge: 2013-03-27 | Disposition: A | Discharge status: Home / Self Care | Payer: Medicare Other | Attending: Emergency Medicine | Admitting: Emergency Medicine

## 2013-03-27 ENCOUNTER — Encounter (HOSPITAL_COMMUNITY): Payer: Self-pay | Admitting: Emergency Medicine

## 2013-03-27 DIAGNOSIS — Z87892 Personal history of anaphylaxis: Secondary | ICD-10-CM | POA: Insufficient documentation

## 2013-03-27 DIAGNOSIS — R0789 Other chest pain: Secondary | ICD-10-CM | POA: Insufficient documentation

## 2013-03-27 DIAGNOSIS — Z79899 Other long term (current) drug therapy: Secondary | ICD-10-CM | POA: Insufficient documentation

## 2013-03-27 DIAGNOSIS — Z791 Long term (current) use of non-steroidal anti-inflammatories (NSAID): Secondary | ICD-10-CM | POA: Insufficient documentation

## 2013-03-27 DIAGNOSIS — F17200 Nicotine dependence, unspecified, uncomplicated: Secondary | ICD-10-CM | POA: Insufficient documentation

## 2013-03-27 DIAGNOSIS — F909 Attention-deficit hyperactivity disorder, unspecified type: Secondary | ICD-10-CM | POA: Insufficient documentation

## 2013-03-27 DIAGNOSIS — R1012 Left upper quadrant pain: Secondary | ICD-10-CM | POA: Insufficient documentation

## 2013-03-27 DIAGNOSIS — R112 Nausea with vomiting, unspecified: Secondary | ICD-10-CM | POA: Insufficient documentation

## 2013-03-27 DIAGNOSIS — R195 Other fecal abnormalities: Secondary | ICD-10-CM

## 2013-03-27 DIAGNOSIS — E119 Type 2 diabetes mellitus without complications: Secondary | ICD-10-CM | POA: Insufficient documentation

## 2013-03-27 DIAGNOSIS — I10 Essential (primary) hypertension: Secondary | ICD-10-CM | POA: Insufficient documentation

## 2013-03-27 DIAGNOSIS — R109 Unspecified abdominal pain: Secondary | ICD-10-CM

## 2013-03-27 DIAGNOSIS — E785 Hyperlipidemia, unspecified: Secondary | ICD-10-CM | POA: Insufficient documentation

## 2013-03-27 DIAGNOSIS — J45909 Unspecified asthma, uncomplicated: Secondary | ICD-10-CM | POA: Insufficient documentation

## 2013-03-27 DIAGNOSIS — IMO0002 Reserved for concepts with insufficient information to code with codable children: Secondary | ICD-10-CM | POA: Insufficient documentation

## 2013-03-27 LAB — POCT I-STAT TROPONIN I: Troponin i, poc: 0 ng/mL (ref 0.00–0.08)

## 2013-03-27 LAB — CBC WITH DIFFERENTIAL/PLATELET
BASOS ABS: 0 10*3/uL (ref 0.0–0.1)
Basophils Relative: 0 % (ref 0–1)
Eosinophils Absolute: 0.4 10*3/uL (ref 0.0–0.7)
Eosinophils Relative: 3 % (ref 0–5)
HCT: 48.2 % (ref 39.0–52.0)
HEMOGLOBIN: 16.6 g/dL (ref 13.0–17.0)
Lymphocytes Relative: 33 % (ref 12–46)
Lymphs Abs: 3.7 10*3/uL (ref 0.7–4.0)
MCH: 31.1 pg (ref 26.0–34.0)
MCHC: 34.4 g/dL (ref 30.0–36.0)
MCV: 90.3 fL (ref 78.0–100.0)
MONOS PCT: 8 % (ref 3–12)
Monocytes Absolute: 0.9 10*3/uL (ref 0.1–1.0)
NEUTROS ABS: 6.3 10*3/uL (ref 1.7–7.7)
Neutrophils Relative %: 56 % (ref 43–77)
Platelets: 301 10*3/uL (ref 150–400)
RBC: 5.34 MIL/uL (ref 4.22–5.81)
RDW: 13.7 % (ref 11.5–15.5)
WBC: 11.3 10*3/uL — ABNORMAL HIGH (ref 4.0–10.5)

## 2013-03-27 LAB — COMPREHENSIVE METABOLIC PANEL
ALBUMIN: 3.3 g/dL — AB (ref 3.5–5.2)
ALK PHOS: 64 U/L (ref 39–117)
ALT: 42 U/L (ref 0–53)
AST: 23 U/L (ref 0–37)
BILIRUBIN TOTAL: 0.6 mg/dL (ref 0.3–1.2)
BUN: 9 mg/dL (ref 6–23)
CHLORIDE: 101 meq/L (ref 96–112)
CO2: 22 mEq/L (ref 19–32)
Calcium: 9 mg/dL (ref 8.4–10.5)
Creatinine, Ser: 0.85 mg/dL (ref 0.50–1.35)
GFR calc Af Amer: >90 mL/min (ref >90)
GFR calc non Af Amer: >90 mL/min (ref >90)
Glucose, Bld: 91 mg/dL (ref 70–99)
Potassium: 4.3 mEq/L (ref 3.7–5.3)
Sodium: 138 mEq/L (ref 137–147)
Total Protein: 7.7 g/dL (ref 6.0–8.3)

## 2013-03-27 LAB — URINALYSIS, ROUTINE W REFLEX MICROSCOPIC
BILIRUBIN URINE: NEGATIVE
GLUCOSE, UA: NEGATIVE mg/dL
Ketones, ur: NEGATIVE mg/dL
Leukocytes, UA: NEGATIVE
Nitrite: NEGATIVE
PH: 5.5 (ref 5.0–8.0)
Protein, ur: NEGATIVE mg/dL
SPECIFIC GRAVITY, URINE: 1.024 (ref 1.005–1.030)
Urobilinogen, UA: 0.2 mg/dL (ref 0.0–1.0)

## 2013-03-27 LAB — OCCULT BLOOD, POC DEVICE: Fecal Occult Bld: POSITIVE — AB

## 2013-03-27 LAB — URINE MICROSCOPIC-ADD ON

## 2013-03-27 LAB — LIPASE, BLOOD: Lipase: 24 U/L (ref 11–59)

## 2013-03-27 MED ORDER — OXYCODONE HCL 5 MG PO TABS
5.0000 mg | ORAL_TABLET | Freq: Once | ORAL | Status: AC
  Administered 2013-03-27: 5 mg via ORAL
  Filled 2013-03-27: qty 1

## 2013-03-27 MED ORDER — MORPHINE SULFATE 4 MG/ML IJ SOLN
4.0000 mg | Freq: Once | INTRAMUSCULAR | Status: AC
  Administered 2013-03-27: 4 mg via INTRAVENOUS
  Filled 2013-03-27: qty 1

## 2013-03-27 MED ORDER — ONDANSETRON HCL 4 MG/2ML IJ SOLN
4.0000 mg | Freq: Once | INTRAMUSCULAR | Status: AC
Start: 2013-03-27 — End: 2013-03-27
  Administered 2013-03-27: 4 mg via INTRAVENOUS
  Filled 2013-03-27: qty 2

## 2013-03-27 MED ORDER — ONDANSETRON 4 MG PO TBDP: 4.0000 mg | ORAL_TABLET | Freq: Three times a day (TID) | ORAL | Status: DC | PRN

## 2013-03-27 MED ORDER — SODIUM CHLORIDE 0.9 % IV BOLUS (SEPSIS)
1000.0000 mL | Freq: Once | INTRAVENOUS | Status: AC
  Administered 2013-03-27: 1000 mL via INTRAVENOUS

## 2013-03-27 NOTE — ED Notes (Signed)
Pt. Notified that MD has prescribed all medications. Pt. Is still requesting to speak with MD. MD notified.

## 2013-03-27 NOTE — ED Notes (Signed)
Per EMS:  Per Pt. He has had abdominal pain for 2 days and c/o difficulty voiding. Pt. reports c/o chest pain today but "the abdominal pain is what is hurting me the most."  Pt. reports some vomiting the past 2 days.

## 2013-03-27 NOTE — Discharge Instructions (Signed)
Take the prescribed medication as directed for nausea.  Continue taking your home pain medications as directed. Follow-up with [facility] for further evaluation. Return to the ED for new or worsening symptoms.

## 2013-03-27 NOTE — ED Notes (Signed)
Pt requesting pain meds for his stomach pain. Hughes BetterJacinta RN informed.

## 2013-03-27 NOTE — ED Provider Notes (Signed)
Medical screening examination/treatment/procedure(s) were performed by non-physician practitioner and as supervising physician I was immediately available for consultation/collaboration.  Flint MelterElliott L Memori Sammon, MD 03/27/13 2350

## 2013-03-27 NOTE — ED Provider Notes (Signed)
CSN: [id number]     Arrival date & time 03/27/13  1604 History   First MD Initiated Contact with Patient 03/27/13 1644     Chief Complaint  Patient presents with  . Abdominal Pain  . Chest Pain   (Consider location/radiation/quality/duration/timing/severity/associated sxs/prior Treatment) The history is provided by the patient and medical records.   This is a 38 y.o. M with PMH significant for HTN, DM, HLP, bipolar disorder, ADHD, anxiety, presenting to the ED for abdominal pain and chest pain. Patient reports right upper quadrant abdominal pain for the past 2 days. Pain described as sharp and stabbing in nature with associated nausea and vomiting.  Pt endorses some difficulty urinating-- states he has been staining more than normal to initiate his stream and urine has appeared darker in color than normal.  Some dysuria without gross hematuria.  No flank pain, fevers, or chills.  He notes his stool has appeared black and tarry in color with some intermixed) blood over the past week. Patient has no prior history of GI bleed. He is not currently on any anticoagulants. Patient has never had a colonoscopy but does have a family history of colon cancer. Patient has occasional right-sided chest pain, states this occurs independent of exertion without associated SOB, palpitations, dizziness, weakness, or diaphoresis. He states this pain is mild but his main concern today is his abdominal pain.  No prior cardiac hx.  Pt is a daily smoker.  VS stable on arrival.  Past Medical History  Diagnosis Date  . Heart valve disorder   . Hypertension   . Asthma   . Diabetes mellitus   . Hyperlipidemia   . Anaphylactic reaction   . Bipolar disorder   . Adult ADHD   . Morbidly obese    Past Surgical History  Procedure Laterality Date  . Carpal tunnel release     History reviewed. No pertinent family history. History  Substance Use Topics  . Smoking status: Current Every Day Smoker -- 1.00 packs/day for 23  years    Types: Cigarettes  . Smokeless tobacco: Never Used  . Alcohol Use: No    Review of Systems  Cardiovascular: Positive for chest pain.  Gastrointestinal: Positive for nausea, vomiting and abdominal pain.  All other systems reviewed and are negative.    Allergies  Atenolol; Other; and Tylenol  Home Medications   Current Outpatient Rx  Name  Route  Sig  Dispense  Refill  . ALPRAZolam (XANAX) 1 MG tablet   Oral   Take 1 mg by mouth 5 (five) times daily.         Marland Kitchen. amphetamine-dextroamphetamine (ADDERALL) 20 MG tablet   Oral   Take 40 mg by mouth daily.         . citalopram (CELEXA) 10 MG tablet   Oral   Take 10 mg by mouth daily.          . cloNIDine (CATAPRES) 0.2 MG tablet   Oral   Take 0.2 mg by mouth every morning.          . Fluticasone-Salmeterol (ADVAIR) 250-50 MCG/DOSE AEPB   Inhalation   Inhale 1 puff into the lungs every 12 (twelve) hours.         . folic acid (FOLVITE) 1 MG tablet   Oral   Take 1 mg by mouth daily.         Marland Kitchen. lisinopril (PRINIVIL,ZESTRIL) 20 MG tablet   Oral   Take 20 mg by mouth daily.         .Marland Kitchen  naproxen (NAPROSYN) 500 MG tablet   Oral   Take 500 mg by mouth 2 (two) times daily with a meal.          . OLANZapine zydis (ZYPREXA) 10 MG disintegrating tablet   Oral   Take 10 mg by mouth daily.          Marland Kitchen omeprazole (PRILOSEC) 40 MG capsule   Oral   Take 40 mg by mouth daily.          Marland Kitchen oxycodone (ROXICODONE) 30 MG immediate release tablet   Oral   Take 30 mg by mouth every 4 (four) hours as needed. For pain.         Marland Kitchen oxyCODONE (ROXICODONE) 5 MG immediate release tablet   Oral   Take 1-2 tablets (5-10 mg total) by mouth every 4 (four) hours as needed for pain.   15 tablet   0   . pravastatin (PRAVACHOL) 40 MG tablet   Oral   Take 40 mg by mouth daily.          . sitaGLIPtan-metformin (JANUMET) 50-500 MG per tablet   Oral   Take 1 tablet by mouth 2 (two) times daily with a meal.          . terbinafine (LAMISIL) 250 MG tablet   Oral   Take 250 mg by mouth daily.          Marland Kitchen zolpidem (AMBIEN) 10 MG tablet   Oral   Take 10 mg by mouth at bedtime as needed for sleep.          BP 110/64  Pulse 82  Temp(Src) 98 F (36.7 C) (Oral)  Resp 13  Wt 400 lb (181.439 kg)  SpO2 98%  Physical Exam  Nursing note and vitals reviewed. Constitutional: He is oriented to person, place, and time. He appears well-developed and well-nourished. No distress.  morbidly obese  HENT:  Head: Normocephalic and atraumatic.  Mouth/Throat: Oropharynx is clear and moist.  Eyes: Conjunctivae and EOM are normal. Pupils are equal, round, and reactive to light.  Neck: Normal range of motion.  Cardiovascular: Normal rate, regular rhythm and normal heart sounds.   Pulmonary/Chest: Effort normal and breath sounds normal. No respiratory distress. He has no wheezes.  Abdominal: Soft. Bowel sounds are normal. There is tenderness in the right upper quadrant and epigastric area. There is no guarding and no CVA tenderness.  TTP epigastric and RUQ regions without rebound or guarding  Genitourinary: Rectal exam shows no external hemorrhoid, no internal hemorrhoid, no fissure, no mass, no tenderness and anal tone normal. Guaiac positive stool.  Light brown stool on glove, no melena or gross blood noted  Musculoskeletal: Normal range of motion.  Neurological: He is alert and oriented to person, place, and time.  Skin: Skin is warm and dry. He is not diaphoretic.  Psychiatric: He has a normal mood and affect.    ED Course  Procedures (including critical care time) Labs Review Labs Reviewed  CBC WITH DIFFERENTIAL - Abnormal; Notable for the following:    WBC 11.3 (*)    All other components within normal limits  COMPREHENSIVE METABOLIC PANEL - Abnormal; Notable for the following:    Albumin 3.3 (*)    All other components within normal limits  URINALYSIS, ROUTINE W REFLEX MICROSCOPIC - Abnormal;  Notable for the following:    Color, Urine AMBER (*)    Hgb urine dipstick SMALL (*)    All other components within normal limits  OCCULT  BLOOD, POC DEVICE - Abnormal; Notable for the following:    Fecal Occult Bld POSITIVE (*)    All other components within normal limits  LIPASE, BLOOD  URINE MICROSCOPIC-ADD ON  POCT I-STAT TROPONIN I   Imaging Review No results found.  EKG Interpretation    Date/Time:  Tuesday March 27 2013 16:10:34 EST Ventricular Rate:  80 PR Interval:  150 QRS Duration: 95 QT Interval:  387 QTC Calculation: 446 R Axis:   93 Text Interpretation:  Sinus rhythm Borderline right axis deviation since last tracing no significant change Confirmed by WENTZ  MD, ELLIOTT ([id number]) on 03/27/2013 4:50:56 PM            MDM   1. Abdominal pain   2. Occult blood in stools    EKG normal sinus rhythm, no acute ischemic changes. Troponin negative. FOBT + today, H/H well WNL.  No gross blood noted on exam.  Pt given pain and nausea meds yet continually calls out asking for pain medications.  On further review of pts chart, he has been seen numerous times for RUQ pain with multiple CT scans and ultrasounds without explanation of his sx-- specifically no hx of gallstones.  Pt is on chronic pain medication monthly from his PCP, yet he denies this when speaking with him.  I have concern for drug seeking behavior and have elected not to give further pain meds in the ED.  Pt afebrile, non-toxic appearing, NAD, VS stable- ok for discharge.  Pt will FU with GI as OP for further evaluation and likely colonoscopy as he discloses he has family hx of colon CA.  Discussed plan with pt, he agreed.  Return precautions advised.  Discussed with Dr. Effie Shy who agrees with assessment and plan of care.  Garlon Hatchet, PA-C 03/27/13 2133

## 2013-04-26 ENCOUNTER — Emergency Department (HOSPITAL_COMMUNITY): Payer: Medicare Other

## 2013-04-26 ENCOUNTER — Emergency Department (HOSPITAL_COMMUNITY)
Admission: EM | Admit: 2013-04-26 | Discharge: 2013-04-26 | Disposition: A | Discharge status: Home / Self Care | Payer: Medicare Other | Attending: Emergency Medicine | Admitting: Emergency Medicine

## 2013-04-26 ENCOUNTER — Encounter (HOSPITAL_COMMUNITY): Payer: Self-pay | Admitting: Emergency Medicine

## 2013-04-26 DIAGNOSIS — I10 Essential (primary) hypertension: Secondary | ICD-10-CM | POA: Insufficient documentation

## 2013-04-26 DIAGNOSIS — R5381 Other malaise: Secondary | ICD-10-CM | POA: Insufficient documentation

## 2013-04-26 DIAGNOSIS — R112 Nausea with vomiting, unspecified: Secondary | ICD-10-CM | POA: Insufficient documentation

## 2013-04-26 DIAGNOSIS — E119 Type 2 diabetes mellitus without complications: Secondary | ICD-10-CM | POA: Insufficient documentation

## 2013-04-26 DIAGNOSIS — Z79899 Other long term (current) drug therapy: Secondary | ICD-10-CM | POA: Insufficient documentation

## 2013-04-26 DIAGNOSIS — F17200 Nicotine dependence, unspecified, uncomplicated: Secondary | ICD-10-CM | POA: Insufficient documentation

## 2013-04-26 DIAGNOSIS — Z791 Long term (current) use of non-steroidal anti-inflammatories (NSAID): Secondary | ICD-10-CM | POA: Insufficient documentation

## 2013-04-26 DIAGNOSIS — IMO0002 Reserved for concepts with insufficient information to code with codable children: Secondary | ICD-10-CM | POA: Insufficient documentation

## 2013-04-26 DIAGNOSIS — R5383 Other fatigue: Secondary | ICD-10-CM

## 2013-04-26 DIAGNOSIS — R109 Unspecified abdominal pain: Secondary | ICD-10-CM

## 2013-04-26 DIAGNOSIS — R300 Dysuria: Secondary | ICD-10-CM | POA: Insufficient documentation

## 2013-04-26 DIAGNOSIS — F319 Bipolar disorder, unspecified: Secondary | ICD-10-CM | POA: Insufficient documentation

## 2013-04-26 DIAGNOSIS — E669 Obesity, unspecified: Secondary | ICD-10-CM | POA: Insufficient documentation

## 2013-04-26 DIAGNOSIS — F909 Attention-deficit hyperactivity disorder, unspecified type: Secondary | ICD-10-CM | POA: Insufficient documentation

## 2013-04-26 DIAGNOSIS — J45901 Unspecified asthma with (acute) exacerbation: Secondary | ICD-10-CM | POA: Insufficient documentation

## 2013-04-26 DIAGNOSIS — E785 Hyperlipidemia, unspecified: Secondary | ICD-10-CM | POA: Insufficient documentation

## 2013-04-26 DIAGNOSIS — R1031 Right lower quadrant pain: Secondary | ICD-10-CM | POA: Insufficient documentation

## 2013-04-26 DIAGNOSIS — F411 Generalized anxiety disorder: Secondary | ICD-10-CM | POA: Insufficient documentation

## 2013-04-26 LAB — CBC WITH DIFFERENTIAL/PLATELET
BASOS ABS: 0 10*3/uL (ref 0.0–0.1)
BASOS PCT: 0 % (ref 0–1)
EOS ABS: 0.1 10*3/uL (ref 0.0–0.7)
Eosinophils Relative: 1 % (ref 0–5)
HCT: 44.7 % (ref 39.0–52.0)
Hemoglobin: 15.2 g/dL (ref 13.0–17.0)
Lymphocytes Relative: 12 % (ref 12–46)
Lymphs Abs: 1.5 10*3/uL (ref 0.7–4.0)
MCH: 29.7 pg (ref 26.0–34.0)
MCHC: 34 g/dL (ref 30.0–36.0)
MCV: 87.5 fL (ref 78.0–100.0)
Monocytes Absolute: 0.7 10*3/uL (ref 0.1–1.0)
Monocytes Relative: 6 % (ref 3–12)
NEUTROS PCT: 81 % — AB (ref 43–77)
Neutro Abs: 9.7 10*3/uL — ABNORMAL HIGH (ref 1.7–7.7)
PLATELETS: 252 10*3/uL (ref 150–400)
RBC: 5.11 MIL/uL (ref 4.22–5.81)
RDW: 13.1 % (ref 11.5–15.5)
WBC: 12 10*3/uL — ABNORMAL HIGH (ref 4.0–10.5)

## 2013-04-26 LAB — URINALYSIS, ROUTINE W REFLEX MICROSCOPIC
BILIRUBIN URINE: NEGATIVE
GLUCOSE, UA: NEGATIVE mg/dL
Hgb urine dipstick: NEGATIVE
KETONES UR: NEGATIVE mg/dL
LEUKOCYTES UA: NEGATIVE
Nitrite: NEGATIVE
PROTEIN: NEGATIVE mg/dL
Specific Gravity, Urine: 1.015 (ref 1.005–1.030)
Urobilinogen, UA: 0.2 mg/dL (ref 0.0–1.0)
pH: 6 (ref 5.0–8.0)

## 2013-04-26 LAB — BASIC METABOLIC PANEL
BUN: 5 mg/dL — AB (ref 6–23)
CALCIUM: 9.3 mg/dL (ref 8.4–10.5)
CO2: 28 mEq/L (ref 19–32)
Chloride: 101 mEq/L (ref 96–112)
Creatinine, Ser: 0.71 mg/dL (ref 0.50–1.35)
GLUCOSE: 118 mg/dL — AB (ref 70–99)
Potassium: 4.3 mEq/L (ref 3.7–5.3)
Sodium: 139 mEq/L (ref 137–147)

## 2013-04-26 LAB — HEPATIC FUNCTION PANEL
ALBUMIN: 3.6 g/dL (ref 3.5–5.2)
ALT: 35 U/L (ref 0–53)
AST: 23 U/L (ref 0–37)
Alkaline Phosphatase: 61 U/L (ref 39–117)
Bilirubin, Direct: <0.2 mg/dL (ref 0.0–0.3)
Total Bilirubin: 0.8 mg/dL (ref 0.3–1.2)
Total Protein: 7.8 g/dL (ref 6.0–8.3)

## 2013-04-26 LAB — LACTIC ACID, PLASMA: LACTIC ACID, VENOUS: 0.9 mmol/L (ref 0.5–2.2)

## 2013-04-26 LAB — LIPASE, BLOOD: LIPASE: 16 U/L (ref 11–59)

## 2013-04-26 MED ORDER — IOHEXOL 300 MG/ML  SOLN
100.0000 mL | Freq: Once | INTRAMUSCULAR | Status: AC | PRN
  Administered 2013-04-26: 120 mL via INTRAVENOUS

## 2013-04-26 MED ORDER — IOHEXOL 300 MG/ML  SOLN
50.0000 mL | Freq: Once | INTRAMUSCULAR | Status: AC | PRN
  Administered 2013-04-26: 50 mL via ORAL

## 2013-04-26 MED ORDER — PROMETHAZINE HCL 25 MG RE SUPP: 25.0000 mg | Freq: Four times a day (QID) | RECTAL | Status: DC | PRN

## 2013-04-26 MED ORDER — ONDANSETRON 8 MG PO TBDP
8.0000 mg | ORAL_TABLET | Freq: Once | ORAL | Status: AC
  Administered 2013-04-26: 8 mg via ORAL
  Filled 2013-04-26: qty 1

## 2013-04-26 MED ORDER — FENTANYL CITRATE 0.05 MG/ML IJ SOLN
100.0000 ug | Freq: Once | INTRAMUSCULAR | Status: AC
  Administered 2013-04-26: 100 ug via INTRAVENOUS
  Filled 2013-04-26: qty 2

## 2013-04-26 NOTE — ED Notes (Signed)
Patient with no complaints at this time. Respirations even and unlabored. Skin warm/dry. Discharge instructions reviewed with patient at this time. Patient given opportunity to voice concerns/ask questions.  Unhappy with discharge RX, tore up prescription and instructions and threw them in the trash in front of RN. Patient left ED in no distress and walked out of department without difficulty.

## 2013-04-26 NOTE — Discharge Instructions (Signed)
Your labs are nonacute today. Your CT scan is once again negative. Review of your records reveal that you have had multiple CT scans and workups without major finding here in the emergency department. Please see your GI specialist as sone as possible next week for evaluation and management of this abdominal pain. Please use promethazine for nausea if needed. Abdominal Pain, Adult Many things can cause belly (abdominal) pain. Most times, the belly pain is not dangerous. Many cases of belly pain can be watched and treated at home. HOME CARE   Do not take medicines that help you go poop (laxatives) unless told to by your doctor.  Only take medicine as told by your doctor.  Eat or drink as told by your doctor. Your doctor will tell you if you should be on a special diet. GET HELP IF:  You do not know what is causing your belly pain.  You have belly pain while you are sick to your stomach (nauseous) or have runny poop (diarrhea).  You have pain while you pee or poop.  Your belly pain wakes you up at night.  You have belly pain that gets worse or better when you eat.  You have belly pain that gets worse when you eat fatty foods. GET HELP RIGHT AWAY IF:   The pain does not go away within 2 hours.  You have a fever.  You keep throwing up (vomiting).  The pain changes and is only in the right or left part of the belly.  You have bloody or tarry looking poop. MAKE SURE YOU:   Understand these instructions.  Will watch your condition.  Will get help right away if you are not doing well or get worse. Document Released: 08/04/2007 Document Revised: 12/06/2012 Document Reviewed: 10/25/2012 [facility] Patient Information 2014 [place], [place].

## 2013-04-26 NOTE — ED Notes (Signed)
Attempted IV access. Unsuccessful. Track marks noted to both arms. Patient vehemently denies drug use. Various stages of healing noted.

## 2013-04-26 NOTE — ED Notes (Signed)
PA at bedside.

## 2013-04-26 NOTE — ED Notes (Signed)
Patient arrives via EMS from home with c/o right side pain and abdominal pain. Sharp in nature, constant. Dysuria, states pressure. Vomited x 4.

## 2013-04-26 NOTE — ED Provider Notes (Signed)
CSN: [id number]     Arrival date & time 04/26/13  1015 History   First MD Initiated Contact with Patient 04/26/13 1018     Chief Complaint  Patient presents with  . Abdominal Pain     (Consider location/radiation/quality/duration/timing/severity/associated sxs/prior Treatment) Patient is a 38 y.o. male presenting with abdominal pain. The history is provided by the patient.  Abdominal Pain Pain location:  RLQ Pain quality: cramping and sharp   Pain radiates to:  Does not radiate Pain severity:  Severe Onset quality: hx of right side abd pain, worse last night. Duration:  1 day Timing:  Constant Progression:  Worsening Chronicity: exacerbation of chronic pain. Context: not recent travel and not suspicious food intake   Context comment:  Smoker Relieved by:  Nothing Worsened by:  Nothing tried Ineffective treatments:  None tried Associated symptoms: dysuria, fatigue, nausea and vomiting   Associated symptoms: no chest pain, no chills, no cough, no fever, no hematemesis, no hematochezia, no hematuria, no melena and no shortness of breath   Risk factors: no alcohol abuse and no aspirin use     Past Medical History  Diagnosis Date  . Heart valve disorder   . Hypertension   . Asthma   . Diabetes mellitus   . Hyperlipidemia   . Anaphylactic reaction   . Bipolar disorder   . Adult ADHD   . Morbidly obese    Past Surgical History  Procedure Laterality Date  . Carpal tunnel release     No family history on file. History  Substance Use Topics  . Smoking status: Current Every Day Smoker -- 1.00 packs/day for 23 years    Types: Cigarettes  . Smokeless tobacco: Never Used  . Alcohol Use: No    Review of Systems  Constitutional: Positive for fatigue. Negative for fever, chills and activity change.       Obesity  HENT: Negative for nosebleeds.   Eyes: Negative for photophobia and discharge.  Respiratory: Positive for wheezing. Negative for cough and shortness of breath.    Cardiovascular: Negative for chest pain and palpitations.  Gastrointestinal: Positive for nausea, vomiting and abdominal pain. Negative for blood in stool, melena, hematochezia and hematemesis.  Genitourinary: Positive for dysuria. Negative for frequency and hematuria.  Musculoskeletal: Negative for arthralgias, back pain and neck pain.  Skin: Negative.   Neurological: Negative for dizziness, seizures and speech difficulty.  Psychiatric/Behavioral: Negative for hallucinations and confusion.      Allergies  Atenolol; Other; and Tylenol  Home Medications   Current Outpatient Rx  Name  Route  Sig  Dispense  Refill  . ALPRAZolam (XANAX) 1 MG tablet   Oral   Take 1 mg by mouth 5 (five) times daily.         Marland Kitchen. amphetamine-dextroamphetamine (ADDERALL) 20 MG tablet   Oral   Take 40 mg by mouth daily.         . citalopram (CELEXA) 10 MG tablet   Oral   Take 10 mg by mouth daily.          . cloNIDine (CATAPRES) 0.2 MG tablet   Oral   Take 0.2 mg by mouth every morning.          . Fluticasone-Salmeterol (ADVAIR) 250-50 MCG/DOSE AEPB   Inhalation   Inhale 1 puff into the lungs every 12 (twelve) hours.         . folic acid (FOLVITE) 1 MG tablet   Oral   Take 1 mg by mouth daily.         .Marland Kitchen  lisinopril (PRINIVIL,ZESTRIL) 20 MG tablet   Oral   Take 20 mg by mouth daily.         . naproxen (NAPROSYN) 500 MG tablet   Oral   Take 500 mg by mouth 2 (two) times daily with a meal.          . OLANZapine zydis (ZYPREXA) 10 MG disintegrating tablet   Oral   Take 10 mg by mouth daily.          Marland Kitchen omeprazole (PRILOSEC) 40 MG capsule   Oral   Take 40 mg by mouth daily.          . ondansetron (ZOFRAN ODT) 4 MG disintegrating tablet   Oral   Take 1 tablet (4 mg total) by mouth every 8 (eight) hours as needed for nausea.   10 tablet   0   . oxycodone (ROXICODONE) 30 MG immediate release tablet   Oral   Take 30 mg by mouth every 4 (four) hours as needed. For  pain.         Marland Kitchen oxyCODONE (ROXICODONE) 5 MG immediate release tablet   Oral   Take 1-2 tablets (5-10 mg total) by mouth every 4 (four) hours as needed for pain.   15 tablet   0   . pravastatin (PRAVACHOL) 40 MG tablet   Oral   Take 40 mg by mouth daily.          . sitaGLIPtan-metformin (JANUMET) 50-500 MG per tablet   Oral   Take 1 tablet by mouth 2 (two) times daily with a meal.         . terbinafine (LAMISIL) 250 MG tablet   Oral   Take 250 mg by mouth daily.          Marland Kitchen zolpidem (AMBIEN) 10 MG tablet   Oral   Take 10 mg by mouth at bedtime as needed for sleep.          BP 144/90  Pulse 91  Temp(Src) 98.2 F (36.8 C) (Oral)  Resp 26  Ht 6\' 2"  (1.88 m)  Wt 400 lb (181.439 kg)  BMI 51.34 kg/m2  SpO2 100% Physical Exam  Nursing note and vitals reviewed. Constitutional: He is oriented to person, place, and time. He appears well-developed and well-nourished.  Non-toxic appearance.  HENT:  Head: Normocephalic.  Right Ear: Tympanic membrane and external ear normal.  Left Ear: Tympanic membrane and external ear normal.  Eyes: EOM and lids are normal. Pupils are equal, round, and reactive to light.  Neck: Normal range of motion. Neck supple. Carotid bruit is not present.  Cardiovascular: Normal rate, regular rhythm, normal heart sounds, intact distal pulses and normal pulses.   Pulmonary/Chest: Breath sounds normal. No respiratory distress.  Abdominal: Soft. Bowel sounds are normal. There is tenderness. There is no guarding.  RLQ tenderness present. Mild to mod diffuse soreness present. No mass. No bruit. Right CVAT.  Genitourinary:  No significant stool in rectal vault. No blood on gloved finger. Test occult blood negative. No anal mass or fistula noted.  Musculoskeletal: Normal range of motion.  Lymphadenopathy:       Head (right side): No submandibular adenopathy present.       Head (left side): No submandibular adenopathy present.    He has no cervical  adenopathy.  Neurological: He is alert and oriented to person, place, and time. He has normal strength. No cranial nerve deficit or sensory deficit.  Skin: Skin is warm and dry.  Psychiatric: He  has a normal mood and affect. His speech is normal.    ED Course  Procedures (including critical care time) Labs Review Labs Reviewed  URINALYSIS, ROUTINE W REFLEX MICROSCOPIC   Imaging Review No results found.  EKG Interpretation   None       MDM  Patient presents to the emergency department with complaint of abdominal pain, mostly in the right lower quadrant. The patient has had this pain in the past. Review of the medical records reveals multiple emergency department visits. Most of these visits have had a little positive results of lab work. CT scans and ultrasounds were found to be negative in the past for any acute changes. The vital signs are nonacute at this time.  Lactic acid was normal at 0.9. Basic metabolic panel revealed a glucose to be elevated at 118, the BUN to be low at 5, otherwise well within normal limits. The complete blood count reveals the white blood cells to be slightly elevated at 12,000, there 81 neutrophils, otherwise well within normal limits. The hepatic function studies were all normal. Lipase was normal at 16. Urinalysis was completely normal, in particular there is no evidence of findings consistent with kidney stone, or urinary tract infection, or severe dehydration.  A contrasted CT scan of the abdomen and pelvis was obtained. The appendix was found to be normal.: Was grossly unremarkable. Test read by radiologist as no acute intra-abdominal or intrapelvic process.  Exam and testing are negative for acute appendicitis, urinary tract infection, kidney stone, abscess, aneurysm, or obstruction.  I discussed these findings with the patient. The patient seems to be some what more comfortable after IV fentanyl. Have advised patient to see his GI specialist  concerning this ongoing abdominal discomfort. Prescription for promethazine suppositories given to the patient for nausea. Patient acknowledges the discharge plan and is in agreement point.    Final diagnoses:  None    **I have reviewed nursing notes, vital signs, and all appropriate lab and imaging results for this patient.Kathie Dike, PA-C 04/27/13 [phone]

## 2013-04-28 NOTE — ED Provider Notes (Signed)
Medical screening examination/treatment/procedure(s) were performed by non-physician practitioner and as supervising physician I was immediately available for consultation/collaboration.   EKG Interpretation None        Croy Drumwright J. Rylan Kaufmann, MD 04/28/13 0735 

## 2013-05-23 ENCOUNTER — Encounter (HOSPITAL_BASED_OUTPATIENT_CLINIC_OR_DEPARTMENT_OTHER): Payer: Self-pay | Admitting: Emergency Medicine

## 2013-05-23 ENCOUNTER — Emergency Department (HOSPITAL_BASED_OUTPATIENT_CLINIC_OR_DEPARTMENT_OTHER)
Admission: EM | Admit: 2013-05-23 | Discharge: 2013-05-23 | Disposition: A | Discharge status: Home / Self Care | Payer: Medicare Other | Attending: Emergency Medicine | Admitting: Emergency Medicine

## 2013-05-23 DIAGNOSIS — L0390 Cellulitis, unspecified: Secondary | ICD-10-CM

## 2013-05-23 DIAGNOSIS — F909 Attention-deficit hyperactivity disorder, unspecified type: Secondary | ICD-10-CM | POA: Insufficient documentation

## 2013-05-23 DIAGNOSIS — Z79899 Other long term (current) drug therapy: Secondary | ICD-10-CM | POA: Insufficient documentation

## 2013-05-23 DIAGNOSIS — J45909 Unspecified asthma, uncomplicated: Secondary | ICD-10-CM | POA: Insufficient documentation

## 2013-05-23 DIAGNOSIS — E119 Type 2 diabetes mellitus without complications: Secondary | ICD-10-CM | POA: Insufficient documentation

## 2013-05-23 DIAGNOSIS — Z791 Long term (current) use of non-steroidal anti-inflammatories (NSAID): Secondary | ICD-10-CM | POA: Insufficient documentation

## 2013-05-23 DIAGNOSIS — M2550 Pain in unspecified joint: Secondary | ICD-10-CM | POA: Insufficient documentation

## 2013-05-23 DIAGNOSIS — R112 Nausea with vomiting, unspecified: Secondary | ICD-10-CM | POA: Insufficient documentation

## 2013-05-23 DIAGNOSIS — IMO0002 Reserved for concepts with insufficient information to code with codable children: Secondary | ICD-10-CM | POA: Insufficient documentation

## 2013-05-23 DIAGNOSIS — I10 Essential (primary) hypertension: Secondary | ICD-10-CM | POA: Insufficient documentation

## 2013-05-23 DIAGNOSIS — F17200 Nicotine dependence, unspecified, uncomplicated: Secondary | ICD-10-CM | POA: Insufficient documentation

## 2013-05-23 DIAGNOSIS — E785 Hyperlipidemia, unspecified: Secondary | ICD-10-CM | POA: Insufficient documentation

## 2013-05-23 DIAGNOSIS — F319 Bipolar disorder, unspecified: Secondary | ICD-10-CM | POA: Insufficient documentation

## 2013-05-23 LAB — CBC WITH DIFFERENTIAL/PLATELET
Basophils Absolute: 0 10*3/uL (ref 0.0–0.1)
Basophils Relative: 0 % (ref 0–1)
Eosinophils Absolute: 0.3 10*3/uL (ref 0.0–0.7)
Eosinophils Relative: 3 % (ref 0–5)
HCT: 49.7 % (ref 39.0–52.0)
HEMOGLOBIN: 16.9 g/dL (ref 13.0–17.0)
LYMPHS ABS: 3 10*3/uL (ref 0.7–4.0)
LYMPHS PCT: 24 % (ref 12–46)
MCH: 31.2 pg (ref 26.0–34.0)
MCHC: 34 g/dL (ref 30.0–36.0)
MCV: 91.7 fL (ref 78.0–100.0)
MONOS PCT: 7 % (ref 3–12)
Monocytes Absolute: 0.8 10*3/uL (ref 0.1–1.0)
NEUTROS ABS: 8.5 10*3/uL — AB (ref 1.7–7.7)
Neutrophils Relative %: 67 % (ref 43–77)
Platelets: 259 10*3/uL (ref 150–400)
RBC: 5.42 MIL/uL (ref 4.22–5.81)
RDW: 14 % (ref 11.5–15.5)
WBC: 12.7 10*3/uL — AB (ref 4.0–10.5)

## 2013-05-23 LAB — COMPREHENSIVE METABOLIC PANEL
ALBUMIN: 3.7 g/dL (ref 3.5–5.2)
ALK PHOS: 67 U/L (ref 39–117)
ALT: 23 U/L (ref 0–53)
AST: 29 U/L (ref 0–37)
BILIRUBIN TOTAL: 0.6 mg/dL (ref 0.3–1.2)
BUN: 6 mg/dL (ref 6–23)
CHLORIDE: 98 meq/L (ref 96–112)
CO2: 29 mEq/L (ref 19–32)
Calcium: 9.7 mg/dL (ref 8.4–10.5)
Creatinine, Ser: 0.9 mg/dL (ref 0.50–1.35)
GFR calc Af Amer: >90 mL/min (ref >90)
GFR calc non Af Amer: >90 mL/min (ref >90)
Glucose, Bld: 117 mg/dL — ABNORMAL HIGH (ref 70–99)
POTASSIUM: 4.7 meq/L (ref 3.7–5.3)
Sodium: 140 mEq/L (ref 137–147)
Total Protein: 8.6 g/dL — ABNORMAL HIGH (ref 6.0–8.3)

## 2013-05-23 MED ORDER — SODIUM CHLORIDE 0.9 % IV BOLUS (SEPSIS)
1000.0000 mL | Freq: Once | INTRAVENOUS | Status: AC
  Administered 2013-05-23: 1000 mL via INTRAVENOUS

## 2013-05-23 MED ORDER — HYDROMORPHONE HCL PF 1 MG/ML IJ SOLN
1.0000 mg | Freq: Once | INTRAMUSCULAR | Status: AC
  Administered 2013-05-23: 1 mg via INTRAVENOUS
  Filled 2013-05-23: qty 1

## 2013-05-23 MED ORDER — CLINDAMYCIN PHOSPHATE 900 MG/50ML IV SOLN
900.0000 mg | Freq: Once | INTRAVENOUS | Status: AC
  Administered 2013-05-23: 900 mg via INTRAVENOUS
  Filled 2013-05-23: qty 50

## 2013-05-23 MED ORDER — CLINDAMYCIN HCL 150 MG PO CAPS: 300.0000 mg | ORAL_CAPSULE | Freq: Three times a day (TID) | ORAL | Status: DC

## 2013-05-23 NOTE — Discharge Instructions (Signed)
As discussed, it is important to follow up with your physician in 2 days to have your wound reexamined.  If you have new, or concerning changes in her condition, please be sure to return here for further evaluation and management.   Cellulitis Cellulitis is an infection of the skin and the tissue beneath it. The infected area is usually red and tender. Cellulitis occurs most often in the arms and lower legs.  CAUSES  Cellulitis is caused by bacteria that enter the skin through cracks or cuts in the skin. The most common types of bacteria that cause cellulitis are Staphylococcus and Streptococcus. SYMPTOMS   Redness and warmth.  Swelling.  Tenderness or pain.  Fever. DIAGNOSIS  Your caregiver can usually determine what is wrong based on a physical exam. Blood tests may also be done. TREATMENT  Treatment usually involves taking an antibiotic medicine. HOME CARE INSTRUCTIONS   Take your antibiotics as directed. Finish them even if you start to feel better.  Keep the infected arm or leg elevated to reduce swelling.  Apply a warm cloth to the affected area up to 4 times per day to relieve pain.  Only take over-the-counter or prescription medicines for pain, discomfort, or fever as directed by your caregiver.  Keep all follow-up appointments as directed by your caregiver. SEEK MEDICAL CARE IF:   You notice red streaks coming from the infected area.  Your red area gets larger or turns dark in color.  Your bone or joint underneath the infected area becomes painful after the skin has healed.  Your infection returns in the same area or another area.  You notice a swollen bump in the infected area.  You develop new symptoms. SEEK IMMEDIATE MEDICAL CARE IF:   You have a fever.  You feel very sleepy.  You develop vomiting or diarrhea.  You have a general ill feeling (malaise) with muscle aches and pains. MAKE SURE YOU:   Understand these instructions.  Will watch your  condition.  Will get help right away if you are not doing well or get worse. Document Released: 11/25/2004 Document Revised: 08/17/2011 Document Reviewed: 05/03/2011 [facility] Patient Information 2014 [place]Care, [place]LL[place].

## 2013-05-23 NOTE — ED Provider Notes (Signed)
CSN: [id number]     Arrival date & time 05/23/13  2045 History  This chart was scribed for Bryan Munchobert Deirdra Heumann, MD by Bryan Gallegos, ED Scribe. This patient was seen in room MH10/MH10 and the patient's care was started at 9:31 PM.  The history is provided by the patient. No language interpreter was used.   HPI Comments: Bryan Gallegos is a 38 y.o. Male, with a hx of DM and a heart valve disorder, who presents to the Emergency Department with a chief complaint of L hand and L arm pain with associated swelling; onset 2 days ago. Patient reports noticing a painful, progressively worsening "red bump" on the L arm and L hand that has since grown and size, changed to a dark coloration and exposed the skin. He states he has tried soaking the affected areas in salt water in addition to applying neosporin, without relief. Patient also reports nausea and vomiting over the past several days, particularly after eating. Patient reports having hot flashes and chills but no fever. Patient denies any IV drug use; however, he reports that he last used cocaine 8-9 years ago (not intravenously). 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  Past Medical History  Diagnosis Date  . Heart valve disorder   . Hypertension   . Asthma   . Diabetes mellitus   . Hyperlipidemia   . Anaphylactic reaction   . Bipolar disorder   . Adult ADHD   . Morbidly obese    Past Surgical History  Procedure Laterality Date  . Carpal tunnel release     No family history on file. History  Substance Use Topics  . Smoking status: Current Every Day Smoker -- 1.00 packs/day for 23 years    Types: Cigarettes  . Smokeless tobacco: Never Used  . Alcohol Use: Yes    Review of Systems  Constitutional: Positive for fever.       Per HPI, otherwise negative  HENT:       Per HPI, otherwise negative  Respiratory:       Per HPI, otherwise negative  Cardiovascular:       Per HPI, otherwise negative  Gastrointestinal: Positive  for nausea and vomiting.  Endocrine:       Negative aside from HPI  Genitourinary:       Neg aside from HPI   Musculoskeletal: Positive for arthralgias and joint swelling.       Per HPI, otherwise negative  Skin: Positive for color change.  Neurological: Negative for syncope.   A complete 10 system review of systems was obtained and all systems are negative except as noted in the HPI and PMH.   Allergies  Atenolol; Other; and Tylenol  Home Medications   Current Outpatient Rx  Name  Route  Sig  Dispense  Refill  . ALPRAZolam (XANAX) 1 MG tablet   Oral   Take 1 mg by mouth 5 (five) times daily.         Marland Kitchen. amphetamine-dextroamphetamine (ADDERALL) 20 MG tablet   Oral   Take 40 mg by mouth daily.         . citalopram (CELEXA) 10 MG tablet   Oral   Take 10 mg by mouth daily.          . cloNIDine (CATAPRES) 0.2 MG tablet   Oral   Take 0.2 mg by mouth at bedtime.          . Fluticasone-Salmeterol (ADVAIR) 250-50 MCG/DOSE AEPB   Inhalation   Inhale 1  puff into the lungs every 12 (twelve) hours.         . folic acid (FOLVITE) 1 MG tablet   Oral   Take 1 mg by mouth daily.         Marland Kitchen lisinopril (PRINIVIL,ZESTRIL) 20 MG tablet   Oral   Take 20 mg by mouth daily.         . naproxen (NAPROSYN) 500 MG tablet   Oral   Take 500 mg by mouth 2 (two) times daily with a meal.          . OLANZapine zydis (ZYPREXA) 10 MG disintegrating tablet   Oral   Take 10 mg by mouth at bedtime.          Marland Kitchen omeprazole (PRILOSEC) 40 MG capsule   Oral   Take 40 mg by mouth daily.          Marland Kitchen oxycodone (ROXICODONE) 30 MG immediate release tablet   Oral   Take 30 mg by mouth every 4 (four) hours as needed. For pain.         . pravastatin (PRAVACHOL) 40 MG tablet   Oral   Take 40 mg by mouth at bedtime.          . promethazine (PHENERGAN) 25 MG suppository   Rectal   Place 1 suppository (25 mg total) rectally every 6 (six) hours as needed for nausea or vomiting.   12  each   0   . sitaGLIPtan-metformin (JANUMET) 50-500 MG per tablet   Oral   Take 1 tablet by mouth 2 (two) times daily with a meal.         . terbinafine (LAMISIL) 250 MG tablet   Oral   Take 250 mg by mouth daily.          Marland Kitchen zolpidem (AMBIEN) 10 MG tablet   Oral   Take 10 mg by mouth at bedtime as needed for sleep.          Triage Vitals: BP 165/89  Pulse 114  Temp(Src) 98.6 F (37 C) (Oral)  Resp 22  Ht 6\' 2"  (1.88 m)  Wt 400 lb (181.439 kg)  BMI 51.34 kg/m2  SpO2 95%  Physical Exam  Nursing note and vitals reviewed. Constitutional: He is oriented to person, place, and time. He appears well-developed. No distress.  HENT:  Head: Normocephalic and atraumatic.  Eyes: Conjunctivae and EOM are normal.  Cardiovascular: Normal rate, regular rhythm and normal heart sounds.   No murmur heard. Pulmonary/Chest: Effort normal and breath sounds normal. No stridor. No respiratory distress. He has no wheezes.  Abdominal: He exhibits no distension.  Musculoskeletal: He exhibits no edema.  L fingers flexion and extension normal. L wrist reduced ROM with flexion and extension secondary to pain. L elbow unremarkable.   Neurological: He is alert and oriented to person, place, and time.  Skin: Skin is warm and dry.  Left forearm: Proximal anterior aspect with erythematous patch that is indurated with a central area of necrosis but no drainage. Central area 1cm x 0.5cm. extending distally from the forearm lesion are a series of small circular lesions  Left Hand: Dorsum of hand with 10cm x 12cm indurated patch with erythema but no drainage and 1cm x 0.5cm central area. Marland Kitchen  Psychiatric: He has a normal mood and affect.    ED Course  Procedures (including critical care time)  DIAGNOSTIC STUDIES: Oxygen Saturation is 95% on room air, adequate by my interpretation.    COORDINATION  OF CARE: 9:38 PM-Discussed my suspicion of a bacterial infection. Ordered CBC, IV fluids, and  clindamycin. Treatment plan discussed with patient and patient agrees.  Patient denies drug use. Labs Review Labs Reviewed  CBC WITH DIFFERENTIAL - Abnormal; Notable for the following:    WBC 12.7 (*)    Neutro Abs 8.5 (*)    All other components within normal limits  COMPREHENSIVE METABOLIC PANEL - Abnormal; Notable for the following:    Glucose, Bld 117 (*)    Total Protein 8.6 (*)    All other components within normal limits   MDM    I personally performed the services described in this documentation, which was scribed in my presence. The recorded information has been reviewed and is accurate.   Patient presents with concerns of cutaneous lesions.  On exam he is awake, alert, afebrile, hemodynamically stable, though mildly tachycardic.  Patient is to areas of concern, neither of which has a fluctuance or is amenable to drainage.  Patient denies IV drug use, but this would be an unusual presentation for cellulitis with 2 distinct areas of infection. Regardless, patient was started on antibiotics, tolerated the first IV dose you well, was discharged in stable condition with followup PMD in 2 days for wound check.   Bryan Munch, MD 05/23/13 [phone]

## 2013-05-23 NOTE — ED Notes (Signed)
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

## 2013-05-23 NOTE — ED Notes (Signed)
Pt denies fever.  Pt reports having nausea and unable to hold anything down.  Pt has hx of DM

## 2013-05-30 ENCOUNTER — Encounter (HOSPITAL_COMMUNITY): Payer: Self-pay | Admitting: Emergency Medicine

## 2013-05-30 ENCOUNTER — Emergency Department (HOSPITAL_COMMUNITY)
Admission: EM | Admit: 2013-05-30 | Discharge: 2013-05-31 | Disposition: A | Discharge status: Home / Self Care | Payer: Medicare Other | Attending: Emergency Medicine | Admitting: Emergency Medicine

## 2013-05-30 DIAGNOSIS — I38 Endocarditis, valve unspecified: Secondary | ICD-10-CM | POA: Insufficient documentation

## 2013-05-30 DIAGNOSIS — Z87892 Personal history of anaphylaxis: Secondary | ICD-10-CM | POA: Insufficient documentation

## 2013-05-30 DIAGNOSIS — E785 Hyperlipidemia, unspecified: Secondary | ICD-10-CM | POA: Insufficient documentation

## 2013-05-30 DIAGNOSIS — Z888 Allergy status to other drugs, medicaments and biological substances status: Secondary | ICD-10-CM | POA: Insufficient documentation

## 2013-05-30 DIAGNOSIS — F17200 Nicotine dependence, unspecified, uncomplicated: Secondary | ICD-10-CM | POA: Insufficient documentation

## 2013-05-30 DIAGNOSIS — I10 Essential (primary) hypertension: Secondary | ICD-10-CM | POA: Insufficient documentation

## 2013-05-30 DIAGNOSIS — IMO0002 Reserved for concepts with insufficient information to code with codable children: Secondary | ICD-10-CM | POA: Insufficient documentation

## 2013-05-30 DIAGNOSIS — F909 Attention-deficit hyperactivity disorder, unspecified type: Secondary | ICD-10-CM | POA: Insufficient documentation

## 2013-05-30 DIAGNOSIS — J45909 Unspecified asthma, uncomplicated: Secondary | ICD-10-CM | POA: Insufficient documentation

## 2013-05-30 DIAGNOSIS — Z79899 Other long term (current) drug therapy: Secondary | ICD-10-CM | POA: Insufficient documentation

## 2013-05-30 DIAGNOSIS — F319 Bipolar disorder, unspecified: Secondary | ICD-10-CM | POA: Insufficient documentation

## 2013-05-30 DIAGNOSIS — L03114 Cellulitis of left upper limb: Secondary | ICD-10-CM

## 2013-05-30 DIAGNOSIS — E119 Type 2 diabetes mellitus without complications: Secondary | ICD-10-CM | POA: Insufficient documentation

## 2013-05-30 LAB — BASIC METABOLIC PANEL
BUN: 12 mg/dL (ref 6–23)
CALCIUM: 8.9 mg/dL (ref 8.4–10.5)
CO2: 24 mEq/L (ref 19–32)
Chloride: 98 mEq/L (ref 96–112)
Creatinine, Ser: 0.76 mg/dL (ref 0.50–1.35)
GFR calc Af Amer: >90 mL/min (ref >90)
GFR calc non Af Amer: >90 mL/min (ref >90)
Glucose, Bld: 99 mg/dL (ref 70–99)
Potassium: 4.4 mEq/L (ref 3.7–5.3)
SODIUM: 137 meq/L (ref 137–147)

## 2013-05-30 LAB — CBC WITH DIFFERENTIAL/PLATELET
BASOS ABS: 0 10*3/uL (ref 0.0–0.1)
BASOS PCT: 0 % (ref 0–1)
EOS ABS: 0.3 10*3/uL (ref 0.0–0.7)
EOS PCT: 2 % (ref 0–5)
HCT: 43.1 % (ref 39.0–52.0)
Hemoglobin: 15.5 g/dL (ref 13.0–17.0)
Lymphocytes Relative: 25 % (ref 12–46)
Lymphs Abs: 3.5 10*3/uL (ref 0.7–4.0)
MCH: 32.5 pg (ref 26.0–34.0)
MCHC: 36 g/dL (ref 30.0–36.0)
MCV: 90.4 fL (ref 78.0–100.0)
Monocytes Absolute: 1 10*3/uL (ref 0.1–1.0)
Monocytes Relative: 7 % (ref 3–12)
Neutro Abs: 9 10*3/uL — ABNORMAL HIGH (ref 1.7–7.7)
Neutrophils Relative %: 66 % (ref 43–77)
Platelets: 298 10*3/uL (ref 150–400)
RBC: 4.77 MIL/uL (ref 4.22–5.81)
RDW: 13.5 % (ref 11.5–15.5)
WBC: 13.7 10*3/uL — ABNORMAL HIGH (ref 4.0–10.5)

## 2013-05-30 LAB — I-STAT CG4 LACTIC ACID, ED: LACTIC ACID, VENOUS: 0.83 mmol/L (ref 0.5–2.2)

## 2013-05-30 MED ORDER — SODIUM CHLORIDE 0.9 % IV BOLUS (SEPSIS)
1000.0000 mL | Freq: Once | INTRAVENOUS | Status: AC
  Administered 2013-05-30: 1000 mL via INTRAVENOUS

## 2013-05-30 MED ORDER — HYDROMORPHONE HCL PF 1 MG/ML IJ SOLN
1.5000 mg | Freq: Once | INTRAMUSCULAR | Status: AC
  Administered 2013-05-30: 1.5 mg via INTRAVENOUS
  Filled 2013-05-30: qty 2

## 2013-05-30 MED ORDER — HYDROMORPHONE HCL PF 1 MG/ML IJ SOLN
0.5000 mg | Freq: Once | INTRAMUSCULAR | Status: AC
  Administered 2013-05-30: 0.5 mg via INTRAVENOUS
  Filled 2013-05-30: qty 1

## 2013-05-30 MED ORDER — PIPERACILLIN-TAZOBACTAM 3.375 G IVPB 30 MIN: 3.3750 g | Freq: Once | INTRAVENOUS | Status: DC

## 2013-05-30 MED ORDER — OXYCODONE HCL 5 MG PO TABS: 5.0000 mg | ORAL_TABLET | ORAL | Status: DC | PRN

## 2013-05-30 MED ORDER — VANCOMYCIN HCL 10 G IV SOLR: 2000.0000 mg | Freq: Once | INTRAVENOUS | Status: DC

## 2013-05-30 MED ORDER — VANCOMYCIN HCL 10 G IV SOLR
2500.0000 mg | Freq: Once | INTRAVENOUS | Status: AC
  Administered 2013-05-30: 2500 mg via INTRAVENOUS
  Filled 2013-05-30 (×2): qty 2500

## 2013-05-30 MED ORDER — ONDANSETRON HCL 4 MG/2ML IJ SOLN
4.0000 mg | Freq: Once | INTRAMUSCULAR | Status: AC
  Administered 2013-05-30: 4 mg via INTRAVENOUS
  Filled 2013-05-30: qty 2

## 2013-05-30 MED ORDER — CLINDAMYCIN HCL 150 MG PO CAPS: 450.0000 mg | ORAL_CAPSULE | Freq: Three times a day (TID) | ORAL | Status: AC

## 2013-05-30 NOTE — Discharge Instructions (Signed)
Please followup with your primary care provider in 2-3 days to have your infection on your arm rechecked. If you are unable to see her primary care provider, please return to the emergency department. In the interim, if you start having fever, nausea, vomiting, poorly controlled pain, or other worrisome symptoms, please return immediately to the ER for reevaluation. It is very important that you take your antibiotics or your infection will get worse.

## 2013-05-30 NOTE — ED Notes (Signed)
Per ems, pt has an infection on his left hand, and left forearm. Pt was treated at trauma center on [facility], was given IV and oral antibiotics and completed that medicine. Pt states his arm is hurting too much. Pt states the abscesses on his left arm are getting larger and more painful. Pt left hand is swollen, pt states it hurts to bend it.

## 2013-05-30 NOTE — ED Provider Notes (Signed)
CSN: [id number]     Arrival date & time 05/30/13  2032 History   First MD Initiated Contact with Patient 05/30/13 2115     Chief Complaint  Patient presents with  . Abscess    HPI 38 year old male with a history of diabetes presents complaining of left arm pain and redness.  This started about 10 days ago. The affected areas on his left forearm and left hand. He was seen at an emergency department a few days ago and diagnosed with cellulitis. He was given a dose of IV antibiotics and given a prescription for clindamycin. However he states that he never got his prescription filled because he could not afford it. In the interim his pain has been worse. He is developed large scabs in the center of his areas of redness on his skin. His pain is moderate in severity. He has not tried any treatments for this. He has not had any fever. No chills. No nausea. No vomiting. He has not had any purulent drainage. He has no prior history of abscesses. Denies any insect or spider bites. Denies any IV drug use.   Past Medical History  Diagnosis Date  . Heart valve disorder   . Hypertension   . Asthma   . Diabetes mellitus   . Hyperlipidemia   . Anaphylactic reaction   . Bipolar disorder   . Adult ADHD   . Morbidly obese    Past Surgical History  Procedure Laterality Date  . Carpal tunnel release     No family history on file. History  Substance Use Topics  . Smoking status: Current Every Day Smoker -- 1.00 packs/day for 23 years    Types: Cigarettes  . Smokeless tobacco: Never Used  . Alcohol Use: Yes    Review of Systems  Constitutional: Negative for fever and chills.  HENT: Negative for congestion and rhinorrhea.   Eyes: Negative for visual disturbance.  Respiratory: Negative for cough and shortness of breath.   Cardiovascular: Negative for chest pain and leg swelling.  Gastrointestinal: Negative for nausea, vomiting, abdominal pain and diarrhea.  Genitourinary: Negative for dysuria,  urgency, frequency, flank pain and difficulty urinating.  Musculoskeletal: Negative for back pain, neck pain and neck stiffness.  Skin: Positive for wound. Negative for rash.  Neurological: Negative for syncope, weakness, numbness and headaches.  All other systems reviewed and are negative.      Allergies  Atenolol; Other; and Tylenol  Home Medications   Current Outpatient Rx  Name  Route  Sig  Dispense  Refill  . ALPRAZolam (XANAX) 1 MG tablet   Oral   Take 1 mg by mouth 5 (five) times daily as needed for anxiety.          Marland Kitchen amphetamine-dextroamphetamine (ADDERALL) 20 MG tablet   Oral   Take 40 mg by mouth daily as needed (for additional focus).          . citalopram (CELEXA) 10 MG tablet   Oral   Take 10 mg by mouth daily.          . cloNIDine (CATAPRES) 0.2 MG tablet   Oral   Take 0.2 mg by mouth at bedtime.          . Fluticasone-Salmeterol (ADVAIR) 250-50 MCG/DOSE AEPB   Inhalation   Inhale 1 puff into the lungs every 12 (twelve) hours.         Marland Kitchen lisinopril (PRINIVIL,ZESTRIL) 20 MG tablet   Oral   Take 20 mg by mouth daily.         Marland Kitchen  OLANZapine zydis (ZYPREXA) 10 MG disintegrating tablet   Oral   Take 10 mg by mouth at bedtime.          Marland Kitchen omeprazole (PRILOSEC) 40 MG capsule   Oral   Take 40 mg by mouth daily.          Marland Kitchen oxycodone (ROXICODONE) 30 MG immediate release tablet   Oral   Take 30 mg by mouth every 4 (four) hours as needed. For pain.         . pravastatin (PRAVACHOL) 40 MG tablet   Oral   Take 40 mg by mouth at bedtime.          . sitaGLIPtan-metformin (JANUMET) 50-500 MG per tablet   Oral   Take 1 tablet by mouth 2 (two) times daily with a meal.         . terbinafine (LAMISIL) 250 MG tablet   Oral   Take 250 mg by mouth daily.          Marland Kitchen zolpidem (AMBIEN) 10 MG tablet   Oral   Take 10 mg by mouth at bedtime as needed for sleep.         . clindamycin (CLEOCIN) 150 MG capsule   Oral   Take 3 capsules (450 mg  total) by mouth 3 (three) times daily.   30 capsule   0   . oxyCODONE (ROXICODONE) 5 MG immediate release tablet   Oral   Take 1 tablet (5 mg total) by mouth every 4 (four) hours as needed for moderate pain or severe pain.   30 tablet   0    BP 134/87  Pulse 83  Temp(Src) 98.1 F (36.7 C) (Oral)  Resp 20  SpO2 98% Physical Exam  Nursing note and vitals reviewed. Constitutional: He is oriented to person, place, and time. He appears well-developed and well-nourished. No distress.  HENT:  Head: Normocephalic and atraumatic.  Mouth/Throat: Oropharynx is clear and moist.  Eyes: Conjunctivae and EOM are normal. Pupils are equal, round, and reactive to light. No scleral icterus.  Neck: Neck supple.  Cardiovascular: Normal rate, regular rhythm, normal heart sounds and intact distal pulses.  Exam reveals no gallop and no friction rub.   No murmur heard. Pulmonary/Chest: Effort normal and breath sounds normal. No respiratory distress. He has no wheezes. He has no rales. He exhibits no tenderness.  Abdominal: Soft. Bowel sounds are normal. He exhibits no distension. There is no tenderness. There is no rebound and no guarding.  Musculoskeletal: He exhibits no edema.  Neurological: He is alert and oriented to person, place, and time. No cranial nerve deficit. He exhibits normal muscle tone. Coordination normal.  Skin: Skin is warm and dry.  Stab with several centimeters of surrounding cellulitis and induration. No fluctuance. He has a similar, smaller area with scab and surrounding cellulitis to his posterior hand with no quadrants. There is no purulent drainage from either wound. Bedside ultrasound demonstrates no discrete fluid collection.  Psychiatric: He has a normal mood and affect.    ED Course  Procedures (including critical care time) Labs Review Labs Reviewed  CBC WITH DIFFERENTIAL - Abnormal; Notable for the following:    WBC 13.7 (*)    Neutro Abs 9.0 (*)    All other  components within normal limits  BASIC METABOLIC PANEL  I-STAT CG4 LACTIC ACID, ED   Imaging Review No results found.   EKG Interpretation None      MDM   38 year old male with diabetes  presents with cellulitis to his forearm and hand. He denies any IV drug use. He is a diabetic. He was seen for this about a week ago and given a dose of IV antibiotics. He was discharged with a prescription for clindamycin which he failed to get filled, states he could not afford it.  On exam he is afebrile, not tachycardic, has normal vital signs. He is nontoxic appearing. He has cellulitis with centralized scab over his forearm and the dorsum of his hand as detailed above. There is no fluctuance, no purulent drainage, no crepitus. Bedside ultrasound demonstrates no discrete fluid collection.  He has a leukocytosis to 13, this appears chronic based on prior labs. His metabolic panel is normal. Lactate is normal.  There is no lesion amenable to drainage at this time. I do not feel that he requires admission currently. We discussed antibiotics and he states that he gets paid tomorrow and can get them filled tomorrow. He was given a dose of IV vancomycin in the emergency Department. He will be discharged home with a prescription for clindamycin. I advised that he followup with his primary care provider in 2 days to have his wound rechecked. He is given emergency Department return precautions.  Final diagnoses:  Cellulitis of left arm      Toney SangJerrid Ulysses Alper, MD 05/30/13 2349

## 2013-05-31 NOTE — ED Provider Notes (Signed)
I saw and evaluated the patient, reviewed the resident's note and I agree with the findings and plan.   EKG Interpretation None      Bryan Gallegos is a 38 y.o. male here with L arm cellulitis. Came to the ED a week ago for cellulitis. Was thought to have a brown recluse bite. Loaded with IV clinda in the ED and d/c home on clinda. However, he didn't fill his prescription. No fevers, notes that cellulitis is getting slightly larger. WBC similar to a week ago. Vitals table. L forearm with an area of black escar with surrounding cellulitis. Another smaller area on the L hand. 2+ pulses. Resident [place] the area and showed no abscess. He was loaded with IV vanc in the ED. He said that he will be paid tomorrow so will be able to get his clinda. I think he can do a trial of outpatient clinda before we admit him for IV abx. Encourage 2 day f/u.    Richardean Canalavid H Yao, MD 05/31/13 1106

## 2013-08-06 ENCOUNTER — Inpatient Hospital Stay (HOSPITAL_COMMUNITY)
Admission: EM | Admit: 2013-08-06 | Discharge: 2013-08-07 | DRG: 556 | Discharge status: Against Medical Advice | Payer: Medicare Other | Attending: Internal Medicine | Admitting: Internal Medicine

## 2013-08-06 ENCOUNTER — Emergency Department (HOSPITAL_COMMUNITY): Payer: Medicare Other

## 2013-08-06 ENCOUNTER — Observation Stay (HOSPITAL_COMMUNITY): Payer: Medicare Other

## 2013-08-06 ENCOUNTER — Encounter (HOSPITAL_COMMUNITY): Payer: Self-pay | Admitting: Radiology

## 2013-08-06 DIAGNOSIS — E119 Type 2 diabetes mellitus without complications: Secondary | ICD-10-CM | POA: Diagnosis present

## 2013-08-06 DIAGNOSIS — L989 Disorder of the skin and subcutaneous tissue, unspecified: Secondary | ICD-10-CM | POA: Diagnosis present

## 2013-08-06 DIAGNOSIS — G8190 Hemiplegia, unspecified affecting unspecified side: Secondary | ICD-10-CM

## 2013-08-06 DIAGNOSIS — R079 Chest pain, unspecified: Secondary | ICD-10-CM | POA: Diagnosis present

## 2013-08-06 DIAGNOSIS — F319 Bipolar disorder, unspecified: Secondary | ICD-10-CM | POA: Diagnosis present

## 2013-08-06 DIAGNOSIS — Z79899 Other long term (current) drug therapy: Secondary | ICD-10-CM

## 2013-08-06 DIAGNOSIS — J45909 Unspecified asthma, uncomplicated: Secondary | ICD-10-CM | POA: Diagnosis present

## 2013-08-06 DIAGNOSIS — E1159 Type 2 diabetes mellitus with other circulatory complications: Secondary | ICD-10-CM

## 2013-08-06 DIAGNOSIS — Z9119 Patient's noncompliance with other medical treatment and regimen: Secondary | ICD-10-CM

## 2013-08-06 DIAGNOSIS — Z6841 Body Mass Index (BMI) 40.0 and over, adult: Secondary | ICD-10-CM

## 2013-08-06 DIAGNOSIS — F17200 Nicotine dependence, unspecified, uncomplicated: Secondary | ICD-10-CM | POA: Diagnosis present

## 2013-08-06 DIAGNOSIS — Z91199 Patient's noncompliance with other medical treatment and regimen due to unspecified reason: Secondary | ICD-10-CM

## 2013-08-06 DIAGNOSIS — Z9989 Dependence on other enabling machines and devices: Secondary | ICD-10-CM

## 2013-08-06 DIAGNOSIS — G4733 Obstructive sleep apnea (adult) (pediatric): Secondary | ICD-10-CM | POA: Diagnosis present

## 2013-08-06 DIAGNOSIS — R531 Weakness: Secondary | ICD-10-CM

## 2013-08-06 DIAGNOSIS — H5460 Unqualified visual loss, one eye, unspecified: Secondary | ICD-10-CM | POA: Diagnosis present

## 2013-08-06 DIAGNOSIS — F909 Attention-deficit hyperactivity disorder, unspecified type: Secondary | ICD-10-CM | POA: Diagnosis present

## 2013-08-06 DIAGNOSIS — Z720 Tobacco use: Secondary | ICD-10-CM

## 2013-08-06 DIAGNOSIS — E785 Hyperlipidemia, unspecified: Secondary | ICD-10-CM | POA: Diagnosis present

## 2013-08-06 DIAGNOSIS — Z833 Family history of diabetes mellitus: Secondary | ICD-10-CM

## 2013-08-06 DIAGNOSIS — R29898 Other symptoms and signs involving the musculoskeletal system: Principal | ICD-10-CM | POA: Diagnosis present

## 2013-08-06 DIAGNOSIS — I10 Essential (primary) hypertension: Secondary | ICD-10-CM | POA: Diagnosis present

## 2013-08-06 LAB — PROTIME-INR
INR: 1.01 (ref 0.00–1.49)
Prothrombin Time: 13.1 seconds (ref 11.6–15.2)

## 2013-08-06 LAB — I-STAT TROPONIN, ED: Troponin i, poc: 0 ng/mL (ref 0.00–0.08)

## 2013-08-06 LAB — MRSA PCR SCREENING: MRSA by PCR: NEGATIVE

## 2013-08-06 LAB — COMPREHENSIVE METABOLIC PANEL
ALBUMIN: 3.3 g/dL — AB (ref 3.5–5.2)
ALT: 23 U/L (ref 0–53)
AST: 24 U/L (ref 0–37)
Alkaline Phosphatase: 62 U/L (ref 39–117)
BILIRUBIN TOTAL: 0.5 mg/dL (ref 0.3–1.2)
BUN: 6 mg/dL (ref 6–23)
CHLORIDE: 97 meq/L (ref 96–112)
CO2: 27 mEq/L (ref 19–32)
CREATININE: 0.75 mg/dL (ref 0.50–1.35)
Calcium: 9 mg/dL (ref 8.4–10.5)
GFR calc Af Amer: >90 mL/min (ref >90)
GFR calc non Af Amer: >90 mL/min (ref >90)
Glucose, Bld: 139 mg/dL — ABNORMAL HIGH (ref 70–99)
Potassium: 4 mEq/L (ref 3.7–5.3)
Sodium: 136 mEq/L — ABNORMAL LOW (ref 137–147)
TOTAL PROTEIN: 7.7 g/dL (ref 6.0–8.3)

## 2013-08-06 LAB — GLUCOSE, CAPILLARY: GLUCOSE-CAPILLARY: 92 mg/dL (ref 70–99)

## 2013-08-06 LAB — CBC
HCT: 42.4 % (ref 39.0–52.0)
Hemoglobin: 14.6 g/dL (ref 13.0–17.0)
MCH: 31.3 pg (ref 26.0–34.0)
MCHC: 34.4 g/dL (ref 30.0–36.0)
MCV: 91 fL (ref 78.0–100.0)
Platelets: 256 10*3/uL (ref 150–400)
RBC: 4.66 MIL/uL (ref 4.22–5.81)
RDW: 13.3 % (ref 11.5–15.5)
WBC: 13.3 10*3/uL — AB (ref 4.0–10.5)

## 2013-08-06 MED ORDER — OXYCODONE HCL 5 MG PO TABS: 5.0000 mg | ORAL_TABLET | ORAL | Status: DC | PRN

## 2013-08-06 MED ORDER — MORPHINE SULFATE 4 MG/ML IJ SOLN
6.0000 mg | Freq: Once | INTRAMUSCULAR | Status: AC
  Administered 2013-08-06: 6 mg via INTRAVENOUS
  Filled 2013-08-06 (×2): qty 2

## 2013-08-06 MED ORDER — CITALOPRAM HYDROBROMIDE 10 MG PO TABS
10.0000 mg | ORAL_TABLET | Freq: Every day | ORAL | Status: DC
  Administered 2013-08-07: 10 mg via ORAL
  Filled 2013-08-06: qty 1

## 2013-08-06 MED ORDER — SODIUM CHLORIDE 0.9 % IV BOLUS (SEPSIS)
1000.0000 mL | Freq: Once | INTRAVENOUS | Status: AC
  Administered 2013-08-06: 1000 mL via INTRAVENOUS

## 2013-08-06 MED ORDER — SIMVASTATIN 20 MG PO TABS
20.0000 mg | ORAL_TABLET | Freq: Every day | ORAL | Status: DC
  Administered 2013-08-07: 20 mg via ORAL
  Filled 2013-08-06 (×2): qty 1

## 2013-08-06 MED ORDER — LORAZEPAM 1 MG PO TABS
1.0000 mg | ORAL_TABLET | Freq: Once | ORAL | Status: AC
  Administered 2013-08-06: 1 mg via ORAL
  Filled 2013-08-06: qty 1

## 2013-08-06 MED ORDER — TERBINAFINE HCL 250 MG PO TABS
250.0000 mg | ORAL_TABLET | Freq: Every day | ORAL | Status: DC
  Administered 2013-08-07: 250 mg via ORAL
  Filled 2013-08-06: qty 1

## 2013-08-06 MED ORDER — ALPRAZOLAM 0.5 MG PO TABS
1.0000 mg | ORAL_TABLET | Freq: Every day | ORAL | Status: DC | PRN
  Administered 2013-08-07 (×2): 1 mg via ORAL
  Filled 2013-08-06 (×2): qty 2

## 2013-08-06 MED ORDER — DOXYCYCLINE HYCLATE 100 MG PO TABS
100.0000 mg | ORAL_TABLET | Freq: Two times a day (BID) | ORAL | Status: DC
  Administered 2013-08-07 (×2): 100 mg via ORAL
  Filled 2013-08-06 (×3): qty 1

## 2013-08-06 MED ORDER — ZOLPIDEM TARTRATE 5 MG PO TABS
10.0000 mg | ORAL_TABLET | Freq: Every evening | ORAL | Status: DC | PRN
  Administered 2013-08-07: 10 mg via ORAL
  Filled 2013-08-06: qty 2

## 2013-08-06 MED ORDER — ASPIRIN 325 MG PO TABS
325.0000 mg | ORAL_TABLET | Freq: Every day | ORAL | Status: DC
  Administered 2013-08-07 (×2): 325 mg via ORAL
  Filled 2013-08-06 (×2): qty 1

## 2013-08-06 MED ORDER — INSULIN ASPART 100 UNIT/ML ~~LOC~~ SOLN
0.0000 [IU] | Freq: Three times a day (TID) | SUBCUTANEOUS | Status: DC
  Administered 2013-08-07 (×2): 1 [IU] via SUBCUTANEOUS

## 2013-08-06 MED ORDER — ENOXAPARIN SODIUM 40 MG/0.4ML ~~LOC~~ SOLN
40.0000 mg | Freq: Every day | SUBCUTANEOUS | Status: DC
  Administered 2013-08-07: 40 mg via SUBCUTANEOUS
  Filled 2013-08-06 (×2): qty 0.4

## 2013-08-06 MED ORDER — CLONIDINE HCL 0.2 MG PO TABS
0.2000 mg | ORAL_TABLET | Freq: Every day | ORAL | Status: DC
  Administered 2013-08-07: 0.2 mg via ORAL
  Filled 2013-08-06 (×2): qty 1

## 2013-08-06 MED ORDER — LINAGLIPTIN 5 MG PO TABS
5.0000 mg | ORAL_TABLET | Freq: Every day | ORAL | Status: DC
  Administered 2013-08-07: 5 mg via ORAL
  Filled 2013-08-06: qty 1

## 2013-08-06 MED ORDER — MOMETASONE FURO-FORMOTEROL FUM 100-5 MCG/ACT IN AERO
2.0000 | INHALATION_SPRAY | Freq: Two times a day (BID) | RESPIRATORY_TRACT | Status: DC
  Administered 2013-08-07: 2 via RESPIRATORY_TRACT
  Filled 2013-08-06: qty 8.8

## 2013-08-06 MED ORDER — ASPIRIN 300 MG RE SUPP
300.0000 mg | Freq: Every day | RECTAL | Status: DC
  Filled 2013-08-06 (×2): qty 1

## 2013-08-06 MED ORDER — OLANZAPINE 10 MG PO TBDP
10.0000 mg | ORAL_TABLET | Freq: Every day | ORAL | Status: DC
  Administered 2013-08-07: 10 mg via ORAL
  Filled 2013-08-06 (×2): qty 1

## 2013-08-06 MED ORDER — IOHEXOL 350 MG/ML SOLN
100.0000 mL | Freq: Once | INTRAVENOUS | Status: AC | PRN
  Administered 2013-08-06: 100 mL via INTRAVENOUS

## 2013-08-06 MED ORDER — SENNOSIDES-DOCUSATE SODIUM 8.6-50 MG PO TABS: 1.0000 | ORAL_TABLET | Freq: Every evening | ORAL | Status: DC | PRN

## 2013-08-06 MED ORDER — AMPHETAMINE-DEXTROAMPHETAMINE 20 MG PO TABS: 40.0000 mg | ORAL_TABLET | Freq: Every day | ORAL | Status: DC | PRN

## 2013-08-06 MED ORDER — OXYCODONE HCL 5 MG PO TABS
30.0000 mg | ORAL_TABLET | ORAL | Status: DC | PRN
  Administered 2013-08-07 (×3): 30 mg via ORAL
  Filled 2013-08-06 (×6): qty 6

## 2013-08-06 MED ORDER — LISINOPRIL 20 MG PO TABS
20.0000 mg | ORAL_TABLET | Freq: Every day | ORAL | Status: DC
  Administered 2013-08-07: 20 mg via ORAL
  Filled 2013-08-06: qty 1

## 2013-08-06 MED ORDER — PANTOPRAZOLE SODIUM 40 MG PO TBEC
40.0000 mg | DELAYED_RELEASE_TABLET | Freq: Every day | ORAL | Status: DC
  Administered 2013-08-07 (×2): 40 mg via ORAL
  Filled 2013-08-06 (×2): qty 1

## 2013-08-06 NOTE — ED Notes (Signed)
Per EMS. While sitting and watching movie started complaining of mid-sternal CP and wife reports pt was lethargic, but was not witnessed by EMS. NS on monitor, CBG 104, BP WNL, PT has multiple sores and EMS unable to start IV. PT reports he takes 30mg  oxycodone at home but denies taking it today. PT reports staph infection is to blame for wounds and took abx back in April. PT denied CP en route. No known cardiac hx, but has DM, HTN, COPD (pain onset ~4pm)

## 2013-08-06 NOTE — Code Documentation (Addendum)
38 year old presents to ED with c/o numbness left side.  Last known well is unknown.  Patient reports last he remembers is 0900. He states he was watching TV and stood up and became dizzy and went out.   Some report from EMS that 1530 was LSW although no one available to confirm - unable to reach family.  NIHSS 4.  Dr. Thad Ranger present.  See her note.  Handoff to Mia RN - request to alert stroke team if new information is obtained regarding history and LSW.

## 2013-08-06 NOTE — ED Provider Notes (Addendum)
CSN: [id number]     Arrival date & time 08/06/13  1743 History   First MD Initiated Contact with Patient 08/06/13 1745     No chief complaint on file.    (Consider location/radiation/quality/duration/timing/severity/associated sxs/prior Treatment) Patient is a 38 y.o. male presenting with chest pain. The history is provided by the patient.  Chest Pain Pain location:  Substernal area Pain quality: sharp   Pain radiates to:  Upper back Pain radiates to the back: yes   Pain severity:  Severe Onset quality:  Sudden Duration:  3 hours Timing:  Constant Progression:  Unchanged Chronicity:  New Context: at rest   Relieved by:  Nothing Worsened by:  Nothing tried Associated symptoms: nausea, numbness (L face, L arm, L leg), shortness of breath, syncope and weakness (L arm)   Associated symptoms: no abdominal pain, no fever and not vomiting     Past Medical History  Diagnosis Date  . Heart valve disorder   . Hypertension   . Asthma   . Diabetes mellitus   . Hyperlipidemia   . Anaphylactic reaction   . Bipolar disorder   . Adult ADHD   . Morbidly obese    Past Surgical History  Procedure Laterality Date  . Carpal tunnel release     No family history on file. History  Substance Use Topics  . Smoking status: Current Every Day Smoker -- 1.00 packs/day for 23 years    Types: Cigarettes  . Smokeless tobacco: Never Used  . Alcohol Use: Yes    Review of Systems  Constitutional: Negative for fever and chills.  Respiratory: Positive for shortness of breath.   Cardiovascular: Positive for chest pain and syncope. Negative for leg swelling.  Gastrointestinal: Positive for nausea. Negative for vomiting, abdominal pain and diarrhea.  Neurological: Positive for weakness (L arm) and numbness (L face, L arm, L leg).  All other systems reviewed and are negative.     Allergies  Atenolol; Other; and Tylenol  Home Medications   Prior to Admission medications   Medication Sig  Start Date End Date Taking? Authorizing Provider  ALPRAZolam Prudy Feeler) 1 MG tablet Take 1 mg by mouth 5 (five) times daily as needed for anxiety.     Historical Provider, MD  amphetamine-dextroamphetamine (ADDERALL) 20 MG tablet Take 40 mg by mouth daily as needed (for additional focus).     Historical Provider, MD  citalopram (CELEXA) 10 MG tablet Take 10 mg by mouth daily.  02/01/13   Historical Provider, MD  cloNIDine (CATAPRES) 0.2 MG tablet Take 0.2 mg by mouth at bedtime.     Historical Provider, MD  Fluticasone-Salmeterol (ADVAIR) 250-50 MCG/DOSE AEPB Inhale 1 puff into the lungs every 12 (twelve) hours.    Historical Provider, MD  lisinopril (PRINIVIL,ZESTRIL) 20 MG tablet Take 20 mg by mouth daily.    Historical Provider, MD  OLANZapine zydis (ZYPREXA) 10 MG disintegrating tablet Take 10 mg by mouth at bedtime.  01/26/13   Historical Provider, MD  omeprazole (PRILOSEC) 40 MG capsule Take 40 mg by mouth daily.  02/01/13   Historical Provider, MD  oxycodone (ROXICODONE) 30 MG immediate release tablet Take 30 mg by mouth every 4 (four) hours as needed. For pain.    Historical Provider, MD  oxyCODONE (ROXICODONE) 5 MG immediate release tablet Take 1 tablet (5 mg total) by mouth every 4 (four) hours as needed for moderate pain or severe pain. 05/30/13   Toney Sang, MD  pravastatin (PRAVACHOL) 40 MG tablet Take 40 mg by  mouth at bedtime.  12/21/12   Historical Provider, MD  sitaGLIPtan-metformin (JANUMET) 50-500 MG per tablet Take 1 tablet by mouth 2 (two) times daily with a meal.    Historical Provider, MD  terbinafine (LAMISIL) 250 MG tablet Take 250 mg by mouth daily.  02/01/13   Historical Provider, MD  zolpidem (AMBIEN) 10 MG tablet Take 10 mg by mouth at bedtime as needed for sleep.    Historical Provider, MD   BP 160/71  Temp(Src) 98.1 F (36.7 C) (Oral)  Resp 15  SpO2 98% Physical Exam  Nursing note and vitals reviewed. Constitutional: He is oriented to person, place, and time. He  appears well-developed and well-nourished. No distress.  HENT:  Head: Normocephalic and atraumatic.  Mouth/Throat: No oropharyngeal exudate.  Eyes: EOM are normal. Pupils are equal, round, and reactive to light.  Neck: Normal range of motion. Neck supple.  Cardiovascular: Normal rate and regular rhythm.  Exam reveals no friction rub.   No murmur heard. Pulmonary/Chest: Effort normal and breath sounds normal. No respiratory distress. He has no wheezes. He has no rales.  Abdominal: He exhibits no distension. There is no tenderness. There is no rebound.  Musculoskeletal: Normal range of motion. He exhibits no edema.  Neurological: He is alert and oriented to person, place, and time. A cranial nerve deficit (L sided facial numbness, R ptosis) and sensory deficit (L face, L arm, L leg numbness. No pain response from L leg) is present. He exhibits abnormal muscle tone (mild L arm weakness).  Skin: He is not diaphoretic.    ED Course  Procedures (including critical care time) Labs Review Labs Reviewed  CBC  COMPREHENSIVE METABOLIC PANEL  PROTIME-INR  I-STAT TROPOININ, ED    Imaging Review No results found.   EKG Interpretation   Date/Time:  Monday August 06 2013 17:47:13 EDT Ventricular Rate:  95 PR Interval:  165 QRS Duration: 83 QT Interval:  358 QTC Calculation: 450 R Axis:   88 Text Interpretation:  Sinus rhythm Similar to prior Confirmed by Gwendolyn Grant   MD, Sky Borboa ([id number]) on 08/06/2013 5:50:02 PM      Angiocath insertion Performed by: Dagmar Hait  Consent: Verbal consent obtained. Risks and benefits: risks, benefits and alternatives were discussed Time out: Immediately prior to procedure a "time out" was called to verify the correct patient, procedure, equipment, support staff and site/side marked as required.  Preparation: Patient was prepped and draped in the usual sterile fashion.  Vein Location: R AC  Yes Ultrasound Guided  Gauge: 18  Normal blood return and  flush without difficulty Patient tolerance: Patient tolerated the procedure well with no immediate complications.    MDM   Final diagnoses:  Hemiplegia, unspecified, affecting nondominant side  Chest pain  Diabetes mellitus  HLD (hyperlipidemia)    38 year old male presents chest pain, left-sided numbness. This all began today after passing out around 2:00. He started complaining of chest pain that was sharp radiating to the center of his back. He also had left side of his body numbness. No history of this prior. Does have history of diabetes, chronic pain, high blood pressure. Patient here with stable vitals. His left face, left arm, left leg are all altered light touch. His left leg is strong, but his left arm is slightly weaker. With the chest pain, and concern about possible aortic disease associated with radiation to the back and neuro deficits. I spoke with Dr. Thad Ranger of neurology he stated he technically qualifies for a code stroke.  Code stroke activated. Cranial nerve exam with no facial weakness, but left-sided facial numbness.  CT head normal. CT Angios normal. I spoke with Dr. Thad Rangereynolds with Neuro who states unlikely stroke and last known normal time unable to be established because he keeps changing his story.  Admitted for MR/repeat CT.     Dagmar HaitWilliam Danetra Glock, MD 08/06/13 2313  Dagmar HaitWilliam Amalia Edgecombe, MD 08/06/13 [phone]

## 2013-08-06 NOTE — Consult Note (Signed)
Referring Physician: Gwendolyn Grant    Chief Complaint: Left sided numbness, weakness, chest pain, syncope  HPI: Bryan Gallegos is an 38 y.o. male who reports that he was at home watching television.  When he attempted to get up he got dizzy, had blurred vision and then "went out".  He then rested on the couch for a while and when he tried to get back up later he again got dizzy and had blurred vision and "went out".  He then noted chest pain and left sided weakness and numbness.  EMS was called at that time. In the ED a code stroke was called.    Date last known well: Date: 08/06/2013 Time last known well: Unable to determine tPA Given: No: Unable to determine LKW  Past Medical History  Diagnosis Date  . Heart valve disorder   . Hypertension   . Asthma   . Diabetes mellitus   . Hyperlipidemia   . Anaphylactic reaction   . Bipolar disorder   . Adult ADHD   . Morbidly obese     Past Surgical History  Procedure Laterality Date  . Carpal tunnel release    . Nose surgery      No family history on file.  Social History:  reports that he has been smoking Cigarettes.  He has a 23 pack-year smoking history. He has never used smokeless tobacco. He reports that he does not drink alcohol or use illicit drugs.  Allergies:  Allergies  Allergen Reactions  . Atenolol Anaphylaxis  . Other Anaphylaxis    "Prednisone like drugs"  . Tylenol [Acetaminophen] Other (See Comments)    unknown    Medications: I have reviewed the patient's current medications. Prior to Admission:  Current outpatient prescriptions: ALPRAZolam (XANAX) 1 MG tablet, Take 1 mg by mouth 5 (five) times daily as needed for anxiety. , Disp: , Rfl: ;  amphetamine-dextroamphetamine (ADDERALL) 20 MG tablet, Take 40 mg by mouth daily as needed (for additional focus). , Disp: , Rfl: ;   citalopram (CELEXA) 10 MG tablet, Take 10 mg by mouth daily. , Disp: , Rfl: ;   cloNIDine (CATAPRES) 0.2 MG tablet, Take 0.2 mg by mouth at  bedtime. , Disp: , Rfl:  Fluticasone-Salmeterol (ADVAIR) 250-50 MCG/DOSE AEPB, Inhale 1 puff into the lungs every 12 (twelve) hours., Disp: , Rfl: ;  lisinopril (PRINIVIL,ZESTRIL) 20 MG tablet, Take 20 mg by mouth daily., Disp: , Rfl: ;   OLANZapine zydis (ZYPREXA) 10 MG disintegrating tablet, Take 10 mg by mouth at bedtime. , Disp: , Rfl: ;   omeprazole (PRILOSEC) 40 MG capsule, Take 40 mg by mouth daily. , Disp: , Rfl:  oxycodone (ROXICODONE) 30 MG immediate release tablet, Take 30 mg by mouth every 4 (four) hours. For pain., Disp: , Rfl: ;  oxyCODONE (ROXICODONE) 5 MG immediate release tablet, Take 1 tablet (5 mg total) by mouth every 4 (four) hours as needed for moderate pain or severe pain., Disp: 30 tablet, Rfl: 0;   pravastatin (PRAVACHOL) 40 MG tablet, Take 40 mg by mouth at bedtime. , Disp: , Rfl:  sitaGLIPtan-metformin (JANUMET) 50-500 MG per tablet, Take 1 tablet by mouth 2 (two) times daily with a meal., Disp: , Rfl: ;  terbinafine (LAMISIL) 250 MG tablet, Take 250 mg by mouth daily. , Disp: , Rfl: ;   zolpidem (AMBIEN) 10 MG tablet, Take 10 mg by mouth at bedtime as needed for sleep., Disp: , Rfl:   ROS: History obtained from the patient  General ROS:  negative for - chills, fatigue, fever, night sweats, weight gain or weight loss Psychological ROS: negative for - behavioral disorder, hallucinations, memory difficulties, mood swings or suicidal ideation Ophthalmic ROS: negative for - blurry vision, double vision, eye pain or loss of vision ENT ROS: negative for - epistaxis, nasal discharge, oral lesions, sore throat, tinnitus or vertigo Allergy and Immunology ROS: negative for - hives or itchy/watery eyes Hematological and Lymphatic ROS: negative for - bleeding problems, bruising or swollen lymph nodes Endocrine ROS: negative for - galactorrhea, hair pattern changes, polydipsia/polyuria or temperature intolerance Respiratory ROS: shortness of breath Cardiovascular ROS: as noted in  HPI Gastrointestinal ROS: negative for - abdominal pain, diarrhea, hematemesis, nausea/vomiting or stool incontinence Genito-Urinary ROS: negative for - dysuria, hematuria, incontinence or urinary frequency/urgency Musculoskeletal ROS: negative for - joint swelling or muscular weakness Neurological ROS: as noted in HPI Dermatological ROS: skin lesion  Physical Examination: Blood pressure 160/71, temperature 98.1 F (36.7 C), temperature source Oral, resp. rate 15, SpO2 98.00%.  Neurologic Examination: Mental Status: Alert, oriented, thought content appropriate.  Speech fluent without evidence of aphasia.  Able to follow 3 step commands without difficulty. Cranial Nerves: II: Discs flat bilaterally; Decreased peripheral vision on the left, pupils equal, round, reactive to light and accommodation III,IV, VI: ptosis not present, extra-ocular motions intact bilaterally V,VII: smile symmetric, facial light touch sensation decreased on the left VIII: hearing normal bilaterally IX,X: gag reflex present XI: bilateral shoulder shrug XII: midline tongue extension Motor: Right : Upper extremity   5/5    Left:     Upper extremity   5-/5  Lower extremity   5/5     Lower extremity   5/5 Tone and bulk:normal tone throughout; no atrophy noted Sensory: Pinprick and light touch decreased on the left Deep Tendon Reflexes: 2+ and symmetric throughout Plantars: Right: downgoing   Left: downgoing Cerebellar: normal finger-to-nose and normal heel-to-shin test Gait: Unable to test CV: pulses palpable throughout     Laboratory Studies:  Basic Metabolic Panel:  Recent Labs Lab 08/06/13 1831  NA 136*  K 4.0  CL 97  CO2 27  GLUCOSE 139*  BUN 6  CREATININE 0.75  CALCIUM 9.0    Liver Function Tests:  Recent Labs Lab 08/06/13 1831  AST 24  ALT 23  ALKPHOS 62  BILITOT 0.5  PROT 7.7  ALBUMIN 3.3*   No results found for this basename: LIPASE, AMYLASE,  in the last 168 hours No results  found for this basename: AMMONIA,  in the last 168 hours  CBC:  Recent Labs Lab 08/06/13 1831  WBC 13.3*  HGB 14.6  HCT 42.4  MCV 91.0  PLT 256    Cardiac Enzymes: No results found for this basename: CKTOTAL, CKMB, CKMBINDEX, TROPONINI,  in the last 168 hours  BNP: No components found with this basename: POCBNP,   CBG: No results found for this basename: GLUCAP,  in the last 168 hours  Microbiology: Results for orders placed during the hospital encounter of 08/14/09  MRSA PCR SCREENING     Status: None   Collection Time    08/14/09  1:05 PM      Result Value Ref Range Status   MRSA by PCR    NEGATIVE Final   Value: NEGATIVE            The GeneXpert MRSA Assay (FDA     approved for NASAL specimens     only), is one component of a     comprehensive  MRSA colonization     surveillance program. It is not     intended to diagnose MRSA     infection nor to guide or     monitor treatment for     MRSA infections.    Coagulation Studies:  Recent Labs  08/06/13 1831  LABPROT 13.1  INR 1.01    Urinalysis: No results found for this basename: COLORURINE, APPERANCEUR, LABSPEC, PHURINE, GLUCOSEU, HGBUR, BILIRUBINUR, KETONESUR, PROTEINUR, UROBILINOGEN, NITRITE, LEUKOCYTESUR,  in the last 168 hours  Lipid Panel:    Component Value Date/Time   CHOL  Value: 182        ATP III CLASSIFICATION:  <200     mg/dL   Desirable  161-096  mg/dL   Borderline High  >=045    mg/dL   High        06/07/8117 0408   CHOL  Value: 181        ATP III CLASSIFICATION:  <200     mg/dL   Desirable  147-829  mg/dL   Borderline High  >=562    mg/dL   High        03/31/8655 0408   TRIG 380* 08/20/2009 0408   TRIG 377* 08/20/2009 0408   HDL 29* 08/20/2009 0408   HDL 31* 08/20/2009 0408   CHOLHDL 6.3 08/20/2009 0408   CHOLHDL 5.8 08/20/2009 0408   VLDL 76* 08/20/2009 0408   VLDL 75* 08/20/2009 0408   LDLCALC  Value: 77        Total Cholesterol/HDL:CHD Risk Coronary Heart Disease Risk Table                      Men   Women  1/2 Average Risk   3.4   3.3  Average Risk       5.0   4.4  2 X Average Risk   9.6   7.1  3 X Average Risk  23.4   11.0        Use the calculated Patient Ratio above and the CHD Risk Table to determine the patient's CHD Risk.        ATP III CLASSIFICATION (LDL):  <100     mg/dL   Optimal  846-962  mg/dL   Near or Above                    Optimal  130-159  mg/dL   Borderline  952-841  mg/dL   High  >324     mg/dL   Very High 05/31/270 5366   LDLCALC  Value: 75        Total Cholesterol/HDL:CHD Risk Coronary Heart Disease Risk Table                     Men   Women  1/2 Average Risk   3.4   3.3  Average Risk       5.0   4.4  2 X Average Risk   9.6   7.1  3 X Average Risk  23.4   11.0        Use the calculated Patient Ratio above and the CHD Risk Table to determine the patient's CHD Risk.        ATP III CLASSIFICATION (LDL):  <100     mg/dL   Optimal  440-347  mg/dL   Near or Above  Optimal  130-159  mg/dL   Borderline  956-213  mg/dL   High  >086     mg/dL   Very High 5/78/4696 2952    HgbA1C:  Lab Results  Component Value Date   HGBA1C  Value: 6.0 (NOTE)                                                                       According to the ADA Clinical Practice Recommendations for 2011, when HbA1c is used as a screening test:   >=6.5%   Diagnostic of Diabetes Mellitus           (if abnormal result  is confirmed)  5.7-6.4%   Increased risk of developing Diabetes Mellitus  References:Diagnosis and Classification of Diabetes Mellitus,Diabetes Care,2011,34(Suppl 1):S62-S69 and Standards of Medical Care in         Diabetes - 2011,Diabetes Care,2011,34  (Suppl 1):S11-S61.* 08/20/2009    Urine Drug Screen:     Component Value Date/Time   LABOPIA POSITIVE* 04/07/2012 2048   COCAINSCRNUR NONE DETECTED 04/07/2012 2048   LABBENZ POSITIVE* 04/07/2012 2048   AMPHETMU NONE DETECTED 04/07/2012 2048   THCU NONE DETECTED 04/07/2012 2048   LABBARB NONE DETECTED 04/07/2012 2048    Alcohol Level: No  results found for this basename: ETH,  in the last 168 hours  Other results: EKG: sinus rhythm at 95 bpm.  Imaging: Ct Head Wo Contrast  08/06/2013   CLINICAL DATA:  Code stroke, lethargy  EXAM: CT HEAD WITHOUT CONTRAST  TECHNIQUE: Contiguous axial images were obtained from the base of the skull through the vertex without intravenous contrast.  COMPARISON:  04/07/2012  FINDINGS: No evidence of parenchymal hemorrhage or extra-axial fluid collection. No mass lesion, mass effect, or midline shift.  No CT evidence of acute infarction.  Cerebral volume is within normal limits.  No ventriculomegaly.  Visualized paranasal sinuses are essentially clear. Partial opacification of the right mastoid air cells (series 3/image 15), chronic.  No evidence of calvarial fracture.  IMPRESSION: No evidence of acute intracranial abnormality.  These results were called by telephone at the time of interpretation on 08/06/2013 at 7:07 PM to Dr. Marena Chancy , who verbally acknowledged these results.   Electronically Signed   By: Charline Bills M.D.   On: 08/06/2013 19:07   Ct Angio Chest Aortic Dissect W &/or W/o  08/06/2013   CLINICAL DATA:  Midsternal chest pain, lethargy.  EXAM: CT ANGIOGRAPHY CHEST, ABDOMEN AND PELVIS  TECHNIQUE: Multidetector CT imaging through the chest, abdomen and pelvis was performed using the standard protocol during bolus administration of intravenous contrast. Multiplanar reconstructed images and MIPs were obtained and reviewed to evaluate the vascular anatomy.  CONTRAST:  OMNIPAQUE IOHEXOL 350 MG/ML SOLN  COMPARISON:  CT abdomen pelvis dated 04/26/2013. CTA chest dated 10/11/2009.  FINDINGS: CTA CHEST FINDINGS  On unenhanced imaging, there is no evidence of intramural hematoma.  No evidence of thoracic aortic aneurysm or dissection.  Although not optimally timed for evaluation of the pulmonary arteries, there is no evidence of pulmonary embolism.  Bilateral pulmonary nodules, unchanged from  2011, including:  --4 mm subpleural right lower lobe nodule (series 8/ image 47)  --7 mm posterior right lower lobe nodule (series 8/ image 47)  --  5 mm subpleural left lower lobe nodule (series 8/image 49)  Concern effusion  Visualized thyroid is unremarkable.  The heart is normal in size.  No pericardial effusion.  Mildly prominent nodes, including a 10 mm short axis subcarinal node (series 5/image 38) and a 12 mm short axis right perihilar node (series 5/ image 41), grossly unchanged from 2011, likely benign.  Review of the MIP images confirms the above findings.  CTA ABDOMEN AND PELVIS FINDINGS  No evidence of abdominal aortic aneurysm or dissection.  Celiac artery, SMA, bilateral renal arteries, and IMA are patent. Bilateral iliac artery systems remain patent.  Moderate hepatic steatosis.  Spleen, pancreas, and adrenal glands are within normal limits. Gallbladder is unremarkable. No intrahepatic or extrahepatic ductal dilatation.  Kidneys are within normal limits.  No hydronephrosis.  No evidence of bowel obstruction.  Normal appendix.  No abdominopelvic ascites.  Prominent upper abdominal nodes, including a 17 mm peripancreatic node (series 5/ image 95) and a 15 mm short axis portacaval node (series 5/ image 105), grossly unchanged and likely reactive. No suspicious retroperitoneal/pelvic lymphadenopathy.  Prostate is unremarkable.  Bladder is within normal limits.  Small fat containing left femoral hernia.  Visualized osseous structures are within normal limits.  Review of the MIP images confirms the above findings.  IMPRESSION: No evidence of thoracoabdominal aortic aneurysm or dissection.  No evidence of pulmonary embolism.  Stable ancillary findings as above.   Electronically Signed   By: Charline Bills M.D.   On: 08/06/2013 19:20   Ct Angio Abd/pel W/ And/or W/o  08/06/2013   CLINICAL DATA:  Midsternal chest pain, lethargy.  EXAM: CT ANGIOGRAPHY CHEST, ABDOMEN AND PELVIS  TECHNIQUE: Multidetector CT  imaging through the chest, abdomen and pelvis was performed using the standard protocol during bolus administration of intravenous contrast. Multiplanar reconstructed images and MIPs were obtained and reviewed to evaluate the vascular anatomy.  CONTRAST:  OMNIPAQUE IOHEXOL 350 MG/ML SOLN  COMPARISON:  CT abdomen pelvis dated 04/26/2013. CTA chest dated 10/11/2009.  FINDINGS: CTA CHEST FINDINGS  On unenhanced imaging, there is no evidence of intramural hematoma.  No evidence of thoracic aortic aneurysm or dissection.  Although not optimally timed for evaluation of the pulmonary arteries, there is no evidence of pulmonary embolism.  Bilateral pulmonary nodules, unchanged from 2011, including:  --4 mm subpleural right lower lobe nodule (series 8/ image 47)  --7 mm posterior right lower lobe nodule (series 8/ image 47)  --5 mm subpleural left lower lobe nodule (series 8/image 49)  Concern effusion  Visualized thyroid is unremarkable.  The heart is normal in size.  No pericardial effusion.  Mildly prominent nodes, including a 10 mm short axis subcarinal node (series 5/image 38) and a 12 mm short axis right perihilar node (series 5/ image 41), grossly unchanged from 2011, likely benign.  Review of the MIP images confirms the above findings.  CTA ABDOMEN AND PELVIS FINDINGS  No evidence of abdominal aortic aneurysm or dissection.  Celiac artery, SMA, bilateral renal arteries, and IMA are patent. Bilateral iliac artery systems remain patent.  Moderate hepatic steatosis.  Spleen, pancreas, and adrenal glands are within normal limits. Gallbladder is unremarkable. No intrahepatic or extrahepatic ductal dilatation.  Kidneys are within normal limits.  No hydronephrosis.  No evidence of bowel obstruction.  Normal appendix.  No abdominopelvic ascites.  Prominent upper abdominal nodes, including a 17 mm peripancreatic node (series 5/ image 95) and a 15 mm short axis portacaval node (series 5/ image 105), grossly unchanged  and  likely reactive. No suspicious retroperitoneal/pelvic lymphadenopathy.  Prostate is unremarkable.  Bladder is within normal limits.  Small fat containing left femoral hernia.  Visualized osseous structures are within normal limits.  Review of the MIP images confirms the above findings.  IMPRESSION: No evidence of thoracoabdominal aortic aneurysm or dissection.  No evidence of pulmonary embolism.  Stable ancillary findings as above.   Electronically Signed   By: Charline BillsSriyesh  Krishnan M.D.   On: 08/06/2013 19:20    Assessment: 38 y.o. male presenting with complaints of chest pain, SOB, left hemiparesis and left visual loss.  Unclear LKW.  NIHSS of 4.  Head CT reviewed and unremarkable.  Patient not a tPA candidate due to inability to determine LKW.    Stroke Risk Factors - diabetes mellitus, hyperlipidemia and hypertension  Plan: 1. HgbA1c, fasting lipid panel 2. Patient will likely not fit in the MRI scanner.  To have repeat head CT in 1-2 days.   3. PT consult, OT consult, Speech consult 4. Echocardiogram 5. Carotid dopplers 6. Prophylactic therapy-Antiplatelet med: Aspirin - dose 325mg  daily 7. Risk factor modification 8. Telemetry monitoring 9. Frequent neuro checks  Case discussed with Dr. Smitty PluckWalden   Briella Hobday, MD [facility] [phone] 08/06/2013, 7:23 PM

## 2013-08-06 NOTE — H&P (Addendum)
[facility] History and Physical  Bryan Gallegos ZOX:[id number] DOB: [birth year] DOA: 08/06/2013  Referring physician: ER physician. PCP: PROVIDER NOT IN SYSTEM   Chief Complaint: Left-sided numbness and weakness and chest pain.  HPI: Bryan Gallegos is a 38 y.o. male with history of diabetes mellitus, tobacco abuse, hypertension, asthma, ADHD while watching TV around evening time was trying to stand up to go to the bathroom and suddenly he fell back and was consciousness. After reading his consciousness he tried to stand up again when he fell back again and at this time he felt tingling and numbness of his whole left body and chest pain which was retrosternal stabbing in nature. Patient's chest pain persisted and the ER physician had done CT angiogram to rule out dissection and CT head was done to rule out any stroke. Neurologist on-call was consulted and neurologist as requested further stroke workup. On exam patient still has mild weakness of the left upper and lower extremities. Denies any shortness of breath. Patient states he has been having some infection and lesions on his hand for which he has been taking clindamycin for last 2 weeks as prescribed by his primary care physician. Over last 2 days patient has been having nausea vomiting but no diarrhea and also has been having mild dysphagia.   Review of Systems: As presented in the history of presenting illness, rest negative.  Past Medical History  Diagnosis Date  . Heart valve disorder   . Hypertension   . Asthma   . Diabetes mellitus   . Hyperlipidemia   . Anaphylactic reaction   . Bipolar disorder   . Adult ADHD   . Morbidly obese    Past Surgical History  Procedure Laterality Date  . Carpal tunnel release    . Nose surgery     Social History:  reports that he has been smoking Cigarettes.  He has a 23 pack-year smoking history. He has never used smokeless tobacco. He reports that he does not drink alcohol or use  illicit drugs. Where does patient live home. Can patient participate in ADLs? Yes.  Allergies  Allergen Reactions  . Atenolol Anaphylaxis  . Other Anaphylaxis    "Prednisone like drugs"  . Tylenol [Acetaminophen] Other (See Comments)    unknown    Family History:  Family History  Problem Relation Age of Onset  . CAD Mother   . Diabetes Mellitus II Mother   . Stroke Mother       Prior to Admission medications   Medication Sig Start Date End Date Taking? Authorizing Provider  ALPRAZolam Prudy Feeler) 1 MG tablet Take 1 mg by mouth 5 (five) times daily as needed for anxiety.    Yes Historical Provider, MD  amphetamine-dextroamphetamine (ADDERALL) 20 MG tablet Take 40 mg by mouth daily as needed (for additional focus).    Yes Historical Provider, MD  citalopram (CELEXA) 10 MG tablet Take 10 mg by mouth daily.  02/01/13  Yes Historical Provider, MD  cloNIDine (CATAPRES) 0.2 MG tablet Take 0.2 mg by mouth at bedtime.    Yes Historical Provider, MD  Fluticasone-Salmeterol (ADVAIR) 250-50 MCG/DOSE AEPB Inhale 1 puff into the lungs every 12 (twelve) hours.   Yes Historical Provider, MD  lisinopril (PRINIVIL,ZESTRIL) 20 MG tablet Take 20 mg by mouth daily.   Yes Historical Provider, MD  OLANZapine zydis (ZYPREXA) 10 MG disintegrating tablet Take 10 mg by mouth at bedtime.  01/26/13  Yes Historical Provider, MD  omeprazole (PRILOSEC) 40 MG capsule Take 40 mg  by mouth daily.  02/01/13  Yes Historical Provider, MD  oxycodone (ROXICODONE) 30 MG immediate release tablet Take 30 mg by mouth every 4 (four) hours. For pain.   Yes Historical Provider, MD  oxyCODONE (ROXICODONE) 5 MG immediate release tablet Take 1 tablet (5 mg total) by mouth every 4 (four) hours as needed for moderate pain or severe pain. 05/30/13  Yes Toney Sang, MD  pravastatin (PRAVACHOL) 40 MG tablet Take 40 mg by mouth at bedtime.  12/21/12  Yes Historical Provider, MD  sitaGLIPtan-metformin (JANUMET) 50-500 MG per tablet Take 1 tablet  by mouth 2 (two) times daily with a meal.   Yes Historical Provider, MD  terbinafine (LAMISIL) 250 MG tablet Take 250 mg by mouth daily.  02/01/13  Yes Historical Provider, MD  zolpidem (AMBIEN) 10 MG tablet Take 10 mg by mouth at bedtime as needed for sleep.   Yes Historical Provider, MD    Physical Exam: Filed Vitals:   08/06/13 2030 08/06/13 2032 08/06/13 2033 08/06/13 2130  BP:  120/76  127/78  Pulse:    82  Temp:   97.6 F (36.4 C)   TempSrc:      Resp:  13  19  Weight: 187.245 kg (412 lb 12.8 oz)     SpO2:  98%  97%     General:  Morbidly obese not in distress.  Eyes: Anicteric no pallor.  ENT: No discharge from the ears eyes nose mouth.  Neck: No mass felt.  Cardiovascular: S1-S2 heard.  Respiratory: No rhonchi crepitations.  Abdomen: Soft nontender bowel sounds present. No guarding or rigidity.  Skin: Multiple lesions in the skin of the hand.  Musculoskeletal: Hand both sides had some edema and has difficulty making fist. Nontender.  Psychiatric: Appears normal.  Neurologic: Alert awake oriented to time place and person. There is mild weakness of the left upper and lower extremity. No facial asymmetry. No tongue deviation. PERRLA positive.  Labs on Admission:  Basic Metabolic Panel:  Recent Labs Lab 08/06/13 1831  NA 136*  K 4.0  CL 97  CO2 27  GLUCOSE 139*  BUN 6  CREATININE 0.75  CALCIUM 9.0   Liver Function Tests:  Recent Labs Lab 08/06/13 1831  AST 24  ALT 23  ALKPHOS 62  BILITOT 0.5  PROT 7.7  ALBUMIN 3.3*   No results found for this basename: LIPASE, AMYLASE,  in the last 168 hours No results found for this basename: AMMONIA,  in the last 168 hours CBC:  Recent Labs Lab 08/06/13 1831  WBC 13.3*  HGB 14.6  HCT 42.4  MCV 91.0  PLT 256   Cardiac Enzymes: No results found for this basename: CKTOTAL, CKMB, CKMBINDEX, TROPONINI,  in the last 168 hours  BNP (last 3 results) No results found for this basename: PROBNP,  in the  last 8760 hours CBG: No results found for this basename: GLUCAP,  in the last 168 hours  Radiological Exams on Admission: Ct Head Wo Contrast  08/06/2013   CLINICAL DATA:  Code stroke, lethargy  EXAM: CT HEAD WITHOUT CONTRAST  TECHNIQUE: Contiguous axial images were obtained from the base of the skull through the vertex without intravenous contrast.  COMPARISON:  04/07/2012  FINDINGS: No evidence of parenchymal hemorrhage or extra-axial fluid collection. No mass lesion, mass effect, or midline shift.  No CT evidence of acute infarction.  Cerebral volume is within normal limits.  No ventriculomegaly.  Visualized paranasal sinuses are essentially clear. Partial opacification of the right mastoid  air cells (series 3/image 15), chronic.  No evidence of calvarial fracture.  IMPRESSION: No evidence of acute intracranial abnormality.  These results were called by telephone at the time of interpretation on 08/06/2013 at 7:07 PM to Dr. Marena Chancy , who verbally acknowledged these results.   Electronically Signed   By: Charline Bills M.D.   On: 08/06/2013 19:07   Ct Angio Chest Aortic Dissect W &/or W/o  08/06/2013   CLINICAL DATA:  Midsternal chest pain, lethargy.  EXAM: CT ANGIOGRAPHY CHEST, ABDOMEN AND PELVIS  TECHNIQUE: Multidetector CT imaging through the chest, abdomen and pelvis was performed using the standard protocol during bolus administration of intravenous contrast. Multiplanar reconstructed images and MIPs were obtained and reviewed to evaluate the vascular anatomy.  CONTRAST:  OMNIPAQUE IOHEXOL 350 MG/ML SOLN  COMPARISON:  CT abdomen pelvis dated 04/26/2013. CTA chest dated 10/11/2009.  FINDINGS: CTA CHEST FINDINGS  On unenhanced imaging, there is no evidence of intramural hematoma.  No evidence of thoracic aortic aneurysm or dissection.  Although not optimally timed for evaluation of the pulmonary arteries, there is no evidence of pulmonary embolism.  Bilateral pulmonary nodules, unchanged  from 2011, including:  --4 mm subpleural right lower lobe nodule (series 8/ image 47)  --7 mm posterior right lower lobe nodule (series 8/ image 47)  --5 mm subpleural left lower lobe nodule (series 8/image 49)  Concern effusion  Visualized thyroid is unremarkable.  The heart is normal in size.  No pericardial effusion.  Mildly prominent nodes, including a 10 mm short axis subcarinal node (series 5/image 38) and a 12 mm short axis right perihilar node (series 5/ image 41), grossly unchanged from 2011, likely benign.  Review of the MIP images confirms the above findings.  CTA ABDOMEN AND PELVIS FINDINGS  No evidence of abdominal aortic aneurysm or dissection.  Celiac artery, SMA, bilateral renal arteries, and IMA are patent. Bilateral iliac artery systems remain patent.  Moderate hepatic steatosis.  Spleen, pancreas, and adrenal glands are within normal limits. Gallbladder is unremarkable. No intrahepatic or extrahepatic ductal dilatation.  Kidneys are within normal limits.  No hydronephrosis.  No evidence of bowel obstruction.  Normal appendix.  No abdominopelvic ascites.  Prominent upper abdominal nodes, including a 17 mm peripancreatic node (series 5/ image 95) and a 15 mm short axis portacaval node (series 5/ image 105), grossly unchanged and likely reactive. No suspicious retroperitoneal/pelvic lymphadenopathy.  Prostate is unremarkable.  Bladder is within normal limits.  Small fat containing left femoral hernia.  Visualized osseous structures are within normal limits.  Review of the MIP images confirms the above findings.  IMPRESSION: No evidence of thoracoabdominal aortic aneurysm or dissection.  No evidence of pulmonary embolism.  Stable ancillary findings as above.   Electronically Signed   By: Charline Bills M.D.   On: 08/06/2013 19:20   Ct Angio Abd/pel W/ And/or W/o  08/06/2013   CLINICAL DATA:  Midsternal chest pain, lethargy.  EXAM: CT ANGIOGRAPHY CHEST, ABDOMEN AND PELVIS  TECHNIQUE: Multidetector  CT imaging through the chest, abdomen and pelvis was performed using the standard protocol during bolus administration of intravenous contrast. Multiplanar reconstructed images and MIPs were obtained and reviewed to evaluate the vascular anatomy.  CONTRAST:  OMNIPAQUE IOHEXOL 350 MG/ML SOLN  COMPARISON:  CT abdomen pelvis dated 04/26/2013. CTA chest dated 10/11/2009.  FINDINGS: CTA CHEST FINDINGS  On unenhanced imaging, there is no evidence of intramural hematoma.  No evidence of thoracic aortic aneurysm or dissection.  Although not  optimally timed for evaluation of the pulmonary arteries, there is no evidence of pulmonary embolism.  Bilateral pulmonary nodules, unchanged from 2011, including:  --4 mm subpleural right lower lobe nodule (series 8/ image 47)  --7 mm posterior right lower lobe nodule (series 8/ image 47)  --5 mm subpleural left lower lobe nodule (series 8/image 49)  Concern effusion  Visualized thyroid is unremarkable.  The heart is normal in size.  No pericardial effusion.  Mildly prominent nodes, including a 10 mm short axis subcarinal node (series 5/image 38) and a 12 mm short axis right perihilar node (series 5/ image 41), grossly unchanged from 2011, likely benign.  Review of the MIP images confirms the above findings.  CTA ABDOMEN AND PELVIS FINDINGS  No evidence of abdominal aortic aneurysm or dissection.  Celiac artery, SMA, bilateral renal arteries, and IMA are patent. Bilateral iliac artery systems remain patent.  Moderate hepatic steatosis.  Spleen, pancreas, and adrenal glands are within normal limits. Gallbladder is unremarkable. No intrahepatic or extrahepatic ductal dilatation.  Kidneys are within normal limits.  No hydronephrosis.  No evidence of bowel obstruction.  Normal appendix.  No abdominopelvic ascites.  Prominent upper abdominal nodes, including a 17 mm peripancreatic node (series 5/ image 95) and a 15 mm short axis portacaval node (series 5/ image 105), grossly unchanged  and likely reactive. No suspicious retroperitoneal/pelvic lymphadenopathy.  Prostate is unremarkable.  Bladder is within normal limits.  Small fat containing left femoral hernia.  Visualized osseous structures are within normal limits.  Review of the MIP images confirms the above findings.  IMPRESSION: No evidence of thoracoabdominal aortic aneurysm or dissection.  No evidence of pulmonary embolism.  Stable ancillary findings as above.   Electronically Signed   By: Charline BillsSriyesh  Krishnan M.D.   On: 08/06/2013 19:20    EKG: Independently reviewed. Normal sinus rhythm.  Assessment/Plan Active Problems:   Hemiplegia, unspecified, affecting nondominant side   Chest pain   Diabetes mellitus   HLD (hyperlipidemia)   OSA (obstructive sleep apnea)   Left-sided weakness   1. Left-sided weakness - please see neurologists notes. At this time since patient cannot have MRI done due to his body habitus neurologist as recommended repeat CT head in 24-48 hours which has to be ordered. Check a hemoglobin A1c lipid panel 2-D echo and carotid Dopplers. Patient is placed on neurochecks and swallow evaluation. Aspirin. 2. Chest pain - given her cardiac risk factor is cycle cardiac markers. Check 2-D echo. Patient did have nausea vomiting and dysphagia which could be probably contributing to patient's symptoms. If cardiac workup is negative patient probably will need GI workup. Patient is on PPI. 3. Bilateral hand swelling with skin lesions - patient was on clindamycin. For now I have ordered x-rays of both hands and place patient on doxycycline. Check uric acid levels and sedimentation rate. May need hand surgery consult in a.m. 4. Diabetes mellitus - place on sliding-scale. Hold metformin for 48 hours as patient has received contrast. 5. OSA noncompliant with CPAP 6. Tobacco abuse - tobacco cessation counseling requested. 7. History of asthma - continue present medications. Patient not wheezing. 8. Hyperlipidemia -  continue present medication. 9. ADHD - continue present medications.    Code Status: Full code.  Family Communication: None.  Disposition Plan: Admit to inpatient.    Eduard ClosArshad N Basheer Molchan [facility] Pager [phone].  If 7PM-7AM, please contact night-coverage [email or web address] Password [email or web address] 08/06/2013, 9:48 PM

## 2013-08-06 NOTE — ED Notes (Signed)
MD at [email or web address]

## 2013-08-07 DIAGNOSIS — F17200 Nicotine dependence, unspecified, uncomplicated: Secondary | ICD-10-CM

## 2013-08-07 DIAGNOSIS — M6281 Muscle weakness (generalized): Secondary | ICD-10-CM

## 2013-08-07 DIAGNOSIS — E1159 Type 2 diabetes mellitus with other circulatory complications: Secondary | ICD-10-CM

## 2013-08-07 DIAGNOSIS — F909 Attention-deficit hyperactivity disorder, unspecified type: Secondary | ICD-10-CM

## 2013-08-07 DIAGNOSIS — I517 Cardiomegaly: Secondary | ICD-10-CM

## 2013-08-07 DIAGNOSIS — G4733 Obstructive sleep apnea (adult) (pediatric): Secondary | ICD-10-CM

## 2013-08-07 LAB — HEMOGLOBIN A1C
Hgb A1c MFr Bld: 6.1 % — ABNORMAL HIGH (ref <5.7)
MEAN PLASMA GLUCOSE: 128 mg/dL — AB (ref <117)

## 2013-08-07 LAB — GLUCOSE, CAPILLARY
GLUCOSE-CAPILLARY: 114 mg/dL — AB (ref 70–99)
Glucose-Capillary: 135 mg/dL — ABNORMAL HIGH (ref 70–99)
Glucose-Capillary: 124 mg/dL — ABNORMAL HIGH (ref 70–99)

## 2013-08-07 LAB — PRO B NATRIURETIC PEPTIDE: Pro B Natriuretic peptide (BNP): 148.9 pg/mL — ABNORMAL HIGH (ref 0–125)

## 2013-08-07 LAB — LIPID PANEL
Cholesterol: 180 mg/dL (ref 0–200)
HDL: 18 mg/dL — ABNORMAL LOW (ref >39)
LDL Cholesterol: 119 mg/dL — ABNORMAL HIGH (ref 0–99)
Total CHOL/HDL Ratio: 10 RATIO
Triglycerides: 216 mg/dL — ABNORMAL HIGH (ref <150)
VLDL: 43 mg/dL — AB (ref 0–40)

## 2013-08-07 LAB — SEDIMENTATION RATE: Sed Rate: 21 mm/hr — ABNORMAL HIGH (ref 0–16)

## 2013-08-07 LAB — TROPONIN I
Troponin I: <0.3 ng/mL (ref <0.30)
Troponin I: <0.3 ng/mL (ref <0.30)

## 2013-08-07 LAB — URIC ACID: URIC ACID, SERUM: 6.9 mg/dL (ref 4.0–7.8)

## 2013-08-07 MED ORDER — PERFLUTREN LIPID MICROSPHERE
INTRAVENOUS | Status: AC
  Administered 2013-08-07: 3 mL via INTRAVENOUS
  Filled 2013-08-07: qty 10

## 2013-08-07 MED ORDER — TERBINAFINE HCL 250 MG PO TABS: 250.0000 mg | ORAL_TABLET | Freq: Every day | ORAL | Status: DC

## 2013-08-07 MED ORDER — PERFLUTREN LIPID MICROSPHERE
1.0000 mL | INTRAVENOUS | Status: AC | PRN
  Administered 2013-08-07: 3 mL via INTRAVENOUS
  Filled 2013-08-07: qty 10

## 2013-08-07 NOTE — Evaluation (Addendum)
Speech Language Pathology Evaluation Patient Details Name: Bryan Gallegos MRN: [id number] DOB: 12/10/1975 Today's Date: 08/07/2013 Time: [id number] SLP Time Calculation (min): 21 min  Problem List:  Patient Active Problem List   Diagnosis Date Noted  . Hemiplegia, unspecified, affecting nondominant side 08/06/2013  . Chest pain 08/06/2013  . Diabetes mellitus 08/06/2013  . HLD (hyperlipidemia) 08/06/2013  . OSA (obstructive sleep apnea) 08/06/2013  . Left-sided weakness 08/06/2013   Past Medical History:  Past Medical History  Diagnosis Date  . Heart valve disorder   . Hypertension   . Asthma   . Diabetes mellitus   . Hyperlipidemia   . Anaphylactic reaction   . Bipolar disorder   . Adult ADHD   . Morbidly obese    Past Surgical History:  Past Surgical History  Procedure Laterality Date  . Carpal tunnel release    . Nose surgery     HPI:  38 [facility] and weakness.  Pt CT head negative, unable to have MRI due to body habitus.  Pt for SLE.     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  Repetition of four word task was difficult due to attention deficit.  Pt also has h/o ADHD per chart = ? portion that is baseline.  Pt reports vision is fuzzy and denies deficits PTA requiring cues to attend to details of picture.  Pt did not state his current address although he has lived there for seven months.  SLP question impact of attention and motivation on results of exam.  Pt lives with his girlfriend and son and states he does not have any responsiblites at home and his girlfriend stays home with him.  Pt would benefit from SLP to maximize cog-ling and communication skills.  Pt educated to plan/goals and agreeable.     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)   Cognition  Overall Cognitive Status: Impaired/Different from baseline Arousal/Alertness: Awake/alert Orientation Level: Oriented X4 Attention: Sustained Sustained Attention: Impaired Sustained Attention Impairment: Verbal basic;Functional basic Memory: Impaired Memory Impairment: Retrieval deficit;Decreased recall of new information Awareness: Impaired Awareness Impairment: Intellectual impairment (poor awareness to speech deficits) Problem Solving: Impaired Behaviors: Restless;Impulsive;Poor frustration tolerance Comments: pt unable to state his current address that he has lived at for seven months    Comprehension  Auditory Comprehension Overall Auditory Comprehension: Impaired Yes/No Questions: Not tested Commands: Impaired One Step Basic Commands: 75-100% accurate Two Step Basic Commands: 50-74% accurate Conversation: Simple Interfering Components: Attention EffectiveTechniques: Repetition Visual Recognition/Discrimination Discrimination: Not tested Reading Comprehension Reading Status: Impaired (pt reports vision changes, unable to see well and items are blurry) Interfering Components: Attention;Processing time;Working memory;Visual perceptual;Visual acuity Effective Techniques: Verbal cueing    Expression Expression Primary Mode of Expression: Verbal Verbal Expression Overall Verbal Expression:  Impaired Initiation: No impairment Level of Generative/Spontaneous Verbalization: Sentence Repetition: Impaired  Level of Impairment: Sentence level Naming: Not tested Pragmatics: Impairment Impairments: Abnormal affect;Dysprosody;Eye contact;Monotone Interfering Components: Attention;Speech intelligibility Non-Verbal Means of Communication: Not applicable Written Expression Dominant Hand: Right Written Expression: Exceptions to [facility] Self Formulation Ability: Word (name written, legible but at angle, ? positioning impact)   Oral / Motor Oral Motor/Sensory Function Overall Oral Motor/Sensory Function: Impaired (generalized weakness, trigeminal nerve impact on left) Labial ROM: Within Functional Limits Labial Symmetry: Within Functional Limits Labial Strength: Within Functional Limits Labial Sensation: Reduced Lingual ROM: Within Functional Limits Lingual Symmetry: Within Functional Limits Lingual Strength: Within Functional Limits Facial ROM: Within Functional Limits Facial Symmetry: Within Functional Limits Facial Strength: Within Functional Limits Facial Sensation: Reduced Velum: Within Functional Limits Motor Speech Overall Motor Speech: Impaired Respiration: Impaired Level of Impairment: Phrase Phonation: Low vocal intensity Resonance: Within functional limits Articulation: Impaired Level of Impairment: Phrase Intelligibility: Intelligibility reduced Word: 75-100% accurate Phrase: 75-100% accurate Sentence: 75-100% accurate Conversation: 75-100% accurate Motor Planning: Witnin functional limits Motor Speech Errors: Not applicable Effective Techniques: Slow rate;Over-articulate   GO Functional Assessment Tool Used: clinical judgement Functional Limitations: Motor speech Motor Speech Current Status [phone]): At least 40 percent but less than 60 percent impaired, limited or restricted Motor Speech Goal Status [phone]): At least 20 percent but less than 40 percent impaired,  limited or restricted   Mills Koller, MS [facility] SLP [phone]

## 2013-08-07 NOTE — Progress Notes (Signed)
  Echocardiogram 2D Echocardiogram with Definity has been performed.  Lorn Junes 08/07/2013, 11:29 AM

## 2013-08-07 NOTE — Progress Notes (Signed)
Pt continues to request pain medication more frequently than currently ordered by MD.  Pt reports taking this medication at home "as often as I feel I need it" for chronic lower back pain.    Pt again requested medication and reported a 9 out 10 lower back pain.  PRN medications due and given per MD order.    Pt witnessed by this RN acting as if he was taking all 6 pills but found 5 of the pills on his bedside table minutes after administration.  When pt asked why there were pills remaining of his bedside table, pt states that that's "how I do it at home - sometimes, it hurts my stomach and so I take one every few minutes".  Educated pt on the importance of taking medication as prescribed and that he may not continue taking the pills one at a time unless prescribed to do so by the MD.  Provided further explanation that if pt does not take medication as prescribed that he could take too much and make himself sicker by depressing his respiratory system.  Pt verbalizes understanding and took remaining pills under RN supervision to include checking under pt's tongue and cheeks.    Charge RN and Joseph Art, MD immediately notified of situation.     Pt currently up in chair, eating a sandwich, watching tv and on the phone with his mother.  Will continue to closely monitor.

## 2013-08-07 NOTE — Progress Notes (Signed)
eLink and CCMD notified of pt's decision to leave AMA.

## 2013-08-07 NOTE — Progress Notes (Signed)
Pt states the he needs "the paper to sign himself out".  Pt upset, and has ripped telebox off, IV out, wristband off and phone out of the wall.  Pt states that his chest pain is due to family stressors to include his son being held at gunpoint, his mother in law's heart attack and his mother's "constant nagging".  Sat down with pt and asked if there was anything we could do to assist him - pt rational and very appreciative but states he "has to go" and signed the leaving hospital against medical advice.    Now pt requesting that he be permitted to "go downstairs and have a smoke and then come back up".  Advised pt that that is against hospital policy and confirmed with Joseph Art, MD that pt may not leave the unit.  Pt states "if I can find a ride, I'm leaving, if not, I guess I'll have to stay".  Charge RN updated of situation.  Pt currently on his phone attempting to find transportation home, has already signed the [facility] documentation timed at 1620 and refused to be placed back on tele.  1640 Pt has left his room and will not respond to his name.  RN followed him out in to the hallway and attempted to get him to stay reminding him that he needs to take care of himself in order to take care of his family.  Pt states he "just has to go" and that "I've already signed the paperwork, so I'm going".  Reminded the pt that if he starts to feel poorly again, to please return to the hospital ED as soon as possible.  Pt verbalized understanding and thanked [place] for our kindness.  Joseph Art, MD notified that pt has signed the necessary documentation and has left AMA.  2H RN Chiropodist, Eunice Blase, notified of situation.

## 2013-08-07 NOTE — Progress Notes (Signed)
Subjective: Patient reports feeling better but continues to have difficulty seeing out of the left eye and numbness on the left side.    Objective: Current vital signs: BP 170/116  Pulse 85  Temp(Src) 97.9 F (36.6 C) (Oral)  Resp 17  Ht 6\' 2"  (1.88 m)  Wt 169 kg (372 lb 9.2 oz)  BMI 47.82 kg/m2  SpO2 98% Vital signs in last 24 hours: Temp:  [97.6 F (36.4 C)-99.1 F (37.3 C)] 97.9 F (36.6 C) (06/09 1200) Pulse Rate:  [72-92] 85 (06/09 1300) Resp:  [9-23] 17 (06/09 1300) BP: (111-170)/(54-116) 170/116 mmHg (06/09 1302) SpO2:  [94 %-99 %] 98 % (06/09 1300) Weight:  [169 kg (372 lb 9.2 oz)-187.245 kg (412 lb 12.8 oz)] 169 kg (372 lb 9.2 oz) (06/08 2238)  Intake/Output from previous day: 06/08 0701 - 06/09 0700 In: 360 [P.O.:360] Out: 700 [Urine:700] Intake/Output this shift: Total I/O In: 240 [P.O.:240] Out: 1250 [Urine:1250] Nutritional status:    Neurologic Exam: Mental Status:  Alert, oriented, thought content appropriate. Speech fluent without evidence of aphasia. Able to follow 3 step commands without difficulty.  Cranial Nerves:  II: Discs flat bilaterally; Decreased vision from the left eye, pupils equal, round, reactive to light and accommodation  III,IV, VI: right ptosis, extra-ocular motions intact bilaterally  V,VII: smile symmetric, facial light touch sensation decreased on the left  VIII: hearing normal bilaterally  IX,X: gag reflex present  XI: bilateral shoulder shrug  XII: midline tongue extension  Motor:  Right : Upper extremity 5/5          Left: Upper extremity 5/5 with limitations of pain   Lower extremity 5/5       Lower extremity 5/5  Tone and bulk:normal tone throughout; no atrophy noted  Sensory: Pinprick and light touch decreased on the left  Deep Tendon Reflexes: 2+ and symmetric throughout  Plantars:  Right: downgoing     Left: downgoing  Cerebellar:  normal finger-to-nose and normal heel-to-shin test  Gait: Unable to test  CV: pulses  palpable throughout      Lab Results: Basic Metabolic Panel:  Recent Labs Lab 08/06/13 1831  NA 136*  K 4.0  CL 97  CO2 27  GLUCOSE 139*  BUN 6  CREATININE 0.75  CALCIUM 9.0    Liver Function Tests:  Recent Labs Lab 08/06/13 1831  AST 24  ALT 23  ALKPHOS 62  BILITOT 0.5  PROT 7.7  ALBUMIN 3.3*   No results found for this basename: LIPASE, AMYLASE,  in the last 168 hours No results found for this basename: AMMONIA,  in the last 168 hours  CBC:  Recent Labs Lab 08/06/13 1831  WBC 13.3*  HGB 14.6  HCT 42.4  MCV 91.0  PLT 256    Cardiac Enzymes:  Recent Labs Lab 08/06/13 2346 08/07/13 0450 08/07/13 1028  TROPONINI <0.30 <0.30 <0.30    Lipid Panel:  Recent Labs Lab 08/07/13 0450  CHOL 180  TRIG 216*  HDL 18*  CHOLHDL 10.0  VLDL 43*  LDLCALC 119*    CBG:  Recent Labs Lab 08/06/13 2227 08/07/13 0811 08/07/13 1206  GLUCAP 92 135* 124*    Microbiology: Results for orders placed during the hospital encounter of 08/06/13  MRSA PCR SCREENING     Status: None   Collection Time    08/06/13 10:28 PM      Result Value Ref Range Status   MRSA by PCR NEGATIVE  NEGATIVE Final   Comment:  The GeneXpert MRSA Assay (FDA     approved for NASAL specimens     only), is one component of a     comprehensive MRSA colonization     surveillance program. It is not     intended to diagnose MRSA     infection nor to guide or     monitor treatment for     MRSA infections.    Coagulation Studies:  Recent Labs  08/06/13 1831  LABPROT 13.1  INR 1.01    Imaging: Ct Head Wo Contrast  08/06/2013   CLINICAL DATA:  Code stroke, lethargy  EXAM: CT HEAD WITHOUT CONTRAST  TECHNIQUE: Contiguous axial images were obtained from the base of the skull through the vertex without intravenous contrast.  COMPARISON:  04/07/2012  FINDINGS: No evidence of parenchymal hemorrhage or extra-axial fluid collection. No mass lesion, mass effect, or midline  shift.  No CT evidence of acute infarction.  Cerebral volume is within normal limits.  No ventriculomegaly.  Visualized paranasal sinuses are essentially clear. Partial opacification of the right mastoid air cells (series 3/image 15), chronic.  No evidence of calvarial fracture.  IMPRESSION: No evidence of acute intracranial abnormality.  These results were called by telephone at the time of interpretation on 08/06/2013 at 7:07 PM to Dr. Marena Chancy , who verbally acknowledged these results.   Electronically Signed   By: Charline Bills M.D.   On: 08/06/2013 19:07   Dg Hand Complete Left  08/06/2013   CLINICAL DATA:  BILATERAL hand swelling and redness for 3 months, history hypertension, diabetes  EXAM: LEFT HAND - COMPLETE 3+ VIEW  COMPARISON:  None  FINDINGS: Marked diffuse soft tissue swelling.  Osseous mineralization grossly normal for technique.  No acute fracture, dislocation, or bone destruction.  Bones unremarkable.  IMPRESSION: No acute osseous abnormalities.  Marked soft tissue swelling.   Electronically Signed   By: Ulyses Southward M.D.   On: 08/06/2013 21:58   Dg Hand Complete Right  08/06/2013   CLINICAL DATA:  Bilateral hand swelling and redness for 3 months.  EXAM: RIGHT HAND - COMPLETE 3+ VIEW  COMPARISON:  None.  FINDINGS: Diffuse soft tissue swelling of the right hand and wrist. No radiopaque soft tissue foreign bodies. No evidence of acute fracture or dislocation. There appears to be an old fracture deformity of the fifth metacarpal bone. No bone erosion or periosteal reaction noted.  IMPRESSION: Diffuse soft tissue swelling.  No acute bony abnormalities.   Electronically Signed   By: Burman Nieves M.D.   On: 08/06/2013 21:57   Ct Angio Chest Aortic Dissect W &/or W/o  08/06/2013   CLINICAL DATA:  Midsternal chest pain, lethargy.  EXAM: CT ANGIOGRAPHY CHEST, ABDOMEN AND PELVIS  TECHNIQUE: Multidetector CT imaging through the chest, abdomen and pelvis was performed using the standard  protocol during bolus administration of intravenous contrast. Multiplanar reconstructed images and MIPs were obtained and reviewed to evaluate the vascular anatomy.  CONTRAST:  OMNIPAQUE IOHEXOL 350 MG/ML SOLN  COMPARISON:  CT abdomen pelvis dated 04/26/2013. CTA chest dated 10/11/2009.  FINDINGS: CTA CHEST FINDINGS  On unenhanced imaging, there is no evidence of intramural hematoma.  No evidence of thoracic aortic aneurysm or dissection.  Although not optimally timed for evaluation of the pulmonary arteries, there is no evidence of pulmonary embolism.  Bilateral pulmonary nodules, unchanged from 2011, including:  --4 mm subpleural right lower lobe nodule (series 8/ image 47)  --7 mm posterior right lower lobe nodule (series 8/  image 47)  --5 mm subpleural left lower lobe nodule (series 8/image 49)  Concern effusion  Visualized thyroid is unremarkable.  The heart is normal in size.  No pericardial effusion.  Mildly prominent nodes, including a 10 mm short axis subcarinal node (series 5/image 38) and a 12 mm short axis right perihilar node (series 5/ image 41), grossly unchanged from 2011, likely benign.  Review of the MIP images confirms the above findings.  CTA ABDOMEN AND PELVIS FINDINGS  No evidence of abdominal aortic aneurysm or dissection.  Celiac artery, SMA, bilateral renal arteries, and IMA are patent. Bilateral iliac artery systems remain patent.  Moderate hepatic steatosis.  Spleen, pancreas, and adrenal glands are within normal limits. Gallbladder is unremarkable. No intrahepatic or extrahepatic ductal dilatation.  Kidneys are within normal limits.  No hydronephrosis.  No evidence of bowel obstruction.  Normal appendix.  No abdominopelvic ascites.  Prominent upper abdominal nodes, including a 17 mm peripancreatic node (series 5/ image 95) and a 15 mm short axis portacaval node (series 5/ image 105), grossly unchanged and likely reactive. No suspicious retroperitoneal/pelvic lymphadenopathy.   Prostate is unremarkable.  Bladder is within normal limits.  Small fat containing left femoral hernia.  Visualized osseous structures are within normal limits.  Review of the MIP images confirms the above findings.  IMPRESSION: No evidence of thoracoabdominal aortic aneurysm or dissection.  No evidence of pulmonary embolism.  Stable ancillary findings as above.   Electronically Signed   By: Charline Bills M.D.   On: 08/06/2013 19:20   Ct Angio Abd/pel W/ And/or W/o  08/06/2013   CLINICAL DATA:  Midsternal chest pain, lethargy.  EXAM: CT ANGIOGRAPHY CHEST, ABDOMEN AND PELVIS  TECHNIQUE: Multidetector CT imaging through the chest, abdomen and pelvis was performed using the standard protocol during bolus administration of intravenous contrast. Multiplanar reconstructed images and MIPs were obtained and reviewed to evaluate the vascular anatomy.  CONTRAST:  OMNIPAQUE IOHEXOL 350 MG/ML SOLN  COMPARISON:  CT abdomen pelvis dated 04/26/2013. CTA chest dated 10/11/2009.  FINDINGS: CTA CHEST FINDINGS  On unenhanced imaging, there is no evidence of intramural hematoma.  No evidence of thoracic aortic aneurysm or dissection.  Although not optimally timed for evaluation of the pulmonary arteries, there is no evidence of pulmonary embolism.  Bilateral pulmonary nodules, unchanged from 2011, including:  --4 mm subpleural right lower lobe nodule (series 8/ image 47)  --7 mm posterior right lower lobe nodule (series 8/ image 47)  --5 mm subpleural left lower lobe nodule (series 8/image 49)  Concern effusion  Visualized thyroid is unremarkable.  The heart is normal in size.  No pericardial effusion.  Mildly prominent nodes, including a 10 mm short axis subcarinal node (series 5/image 38) and a 12 mm short axis right perihilar node (series 5/ image 41), grossly unchanged from 2011, likely benign.  Review of the MIP images confirms the above findings.  CTA ABDOMEN AND PELVIS FINDINGS  No evidence of abdominal aortic  aneurysm or dissection.  Celiac artery, SMA, bilateral renal arteries, and IMA are patent. Bilateral iliac artery systems remain patent.  Moderate hepatic steatosis.  Spleen, pancreas, and adrenal glands are within normal limits. Gallbladder is unremarkable. No intrahepatic or extrahepatic ductal dilatation.  Kidneys are within normal limits.  No hydronephrosis.  No evidence of bowel obstruction.  Normal appendix.  No abdominopelvic ascites.  Prominent upper abdominal nodes, including a 17 mm peripancreatic node (series 5/ image 95) and a 15 mm short axis portacaval node (series 5/  image 105), grossly unchanged and likely reactive. No suspicious retroperitoneal/pelvic lymphadenopathy.  Prostate is unremarkable.  Bladder is within normal limits.  Small fat containing left femoral hernia.  Visualized osseous structures are within normal limits.  Review of the MIP images confirms the above findings.  IMPRESSION: No evidence of thoracoabdominal aortic aneurysm or dissection.  No evidence of pulmonary embolism.  Stable ancillary findings as above.   Electronically Signed   By: Charline BillsSriyesh  Krishnan M.D.   On: 08/06/2013 19:20    Medications:  I have reviewed the patient's current medications. Scheduled: . aspirin  300 mg Rectal Daily   Or  . aspirin  325 mg Oral Daily  . citalopram  10 mg Oral Daily  . cloNIDine  0.2 mg Oral QHS  . doxycycline  100 mg Oral Q12H  . enoxaparin (LOVENOX) injection  40 mg Subcutaneous QHS  . insulin aspart  0-9 Units Subcutaneous TID WC  . linagliptin  5 mg Oral Daily  . lisinopril  20 mg Oral Daily  . mometasone-formoterol  2 puff Inhalation BID  . OLANZapine zydis  10 mg Oral QHS  . pantoprazole  40 mg Oral Daily  . simvastatin  20 mg Oral q1800  . [START ON 08/08/2013] terbinafine  250 mg Oral Daily    Assessment/Plan: Unclear if any functional features contributing to examination.  Patient unable to have a MRI of the brain due to size.  ASA started.  Carotid doppler  shows no hemodynamically significant stenosis.  Echocardiogram results pending.  LDL 119 on Simvastatin.  Recommendations: 1.  Continue ASA 2.  Repeat head CT in AM 3.  Will continue to f/u stroke w/u    LOS: 1 day   Thana FarrLeslie Shawnique Mariotti, MD [facility] [phone] 08/07/2013  1:35 PM
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
## 2013-08-07 NOTE — Evaluation (Signed)
Physical Therapy Evaluation Patient Details Name: Bryan Gallegos MRN: [id number] DOB: 03/16/1975 Today's Date: 08/07/2013   History of Present Illness  Adm 08/06/13 with Lt sided weakness and numbness. Head CT negative (will be repeated after 24-48 hrs due to pt cannot have MRI--too large for MRI machine). PMHx- bipolar disorder, heart valve disorder, DM, ADHD  Clinical Impression  Patient evaluated by Physical Therapy with no further acute PT needs identified. All education has been completed and the patient has no further questions. PT is signing off. Thank you for this referral.     Follow Up Recommendations No PT follow up    Equipment Recommendations  None recommended by PT    Recommendations for Other Services OT consult (especially for vision and further cognition)     Precautions / Restrictions        Mobility  Bed Mobility                  Transfers Overall transfer level: Independent Equipment used: None                Ambulation/Gait Ambulation/Gait assistance: Independent Ambulation Distance (Feet): 500 Feet Assistive device: None Gait Pattern/deviations: WFL(Within Functional Limits) Gait velocity: able to vary speed significantly up and down Gait velocity interpretation: at or above normal speed for age/gender    Stairs            Wheelchair Mobility    Modified Rankin (Stroke Patients Only) Modified Rankin (Stroke Patients Only) Pre-Morbid Rankin Score: No symptoms Modified Rankin: Moderate disability (See SLP note re: cognitive/language)     Balance Overall balance assessment: Independent                           High level balance activites: Direction changes;Turns;Sudden stops;Head turns;Other (comment) High Level Balance Comments: also stood with feet shoulder width with eyes closed x 30 sec without difficulty Standardized Balance Assessment Standardized Balance Assessment : Dynamic Gait Index   Dynamic Gait  Index Level Surface: Normal Change in Gait Speed: Normal Gait with Horizontal Head Turns: Normal Gait with Vertical Head Turns: Normal Gait and Pivot Turn: Normal Step Around Obstacles: Normal       Pertinent Vitals/Pain Denied pain Mild shortness of breath with fast walking (as PTA) BP 130/96 sitting HR 88-108     Home Living Family/patient expects to be discharged to:: Private residence Living Arrangements: Spouse/significant other;Children (girlfriend, 40 yo son) Available Help at Discharge: Family;Available PRN/intermittently Type of Home: House Home Access: Stairs to enter Entrance Stairs-Rails: None Entrance Stairs-Number of Steps: 1 Home Layout: Two level Home Equipment: None      Prior Function Level of Independence: Independent               Hand Dominance        Extremity/Trunk Assessment   Upper Extremity Assessment: Defer to OT evaluation           Lower Extremity Assessment: Overall WFL for tasks assessed      Cervical / Trunk Assessment: Normal  Communication   Communication: No difficulties  Cognition Arousal/Alertness: Awake/alert Behavior During Therapy:  (a bit manic) Overall Cognitive Status: Within Functional Limits for tasks assessed                      General Comments General comments (skin integrity, edema, etc.): Denies vision changes are effecting his balance or gait ( and no deficits observed)    Exercises  Assessment/Plan    PT Assessment Patent does not need any further PT services  PT Diagnosis     PT Problem List    PT Treatment Interventions     PT Goals (Current goals can be found in the Care Plan section) Acute Rehab PT Goals PT Goal Formulation: No goals set, d/c therapy    Frequency     Barriers to discharge        Co-evaluation               End of Session   Activity Tolerance: Patient tolerated treatment well Patient left: in chair;with call bell/phone within  reach Nurse Communication: Mobility status (d/c from PT)         Time: 1540-0867 PT Time Calculation (min): 11 min   Charges:   PT Evaluation $Initial PT Evaluation Tier I: 1 Procedure     PT G CodesScherrie November Dorathy Stallone 08-20-13, 2:53 PM Pager [phone]

## 2013-08-07 NOTE — Discharge Summary (Signed)
[facility] TEAM 1 - Stepdown/ICU TEAM Progress Note  Bryan Gallegos GMW:[id number] DOB: 08/27/1975 DOA: 08/06/2013 PCP: PROVIDER NOT IN SYSTEM  Admit HPI / Brief Narrative: Bryan Gallegos is a 38 y.o. Male  PMHx  with history of diabetes mellitus, tobacco abuse, hypertension, asthma, ADHD while watching TV around evening time was trying to stand up to go to the bathroom and suddenly he fell back and was consciousness. After reading his consciousness he tried to stand up again when he fell back again and at this time he felt tingling and numbness of his whole left body and chest pain which was retrosternal stabbing in nature. Patient's chest pain persisted and the ER physician had done CT angiogram to rule out dissection and CT head was done to rule out any stroke. Neurologist on-call was consulted and neurologist as requested further stroke workup. On exam patient still has mild weakness of the left upper and lower extremities. Denies any shortness of breath. Patient states he has been having some infection and lesions on his hand for which he has been taking clindamycin for last 2 weeks as prescribed by his primary care physician. Over last 2 days patient has been having nausea vomiting but no diarrhea and also has been having mild dysphagia.      HPI/Subjective: 6/9 patient stating neurological manifestations have resolved  Assessment/Plan: Left-sided weakness  -Patient counseled on the sequela of not following is to finish the workup to identify cause to include death. , Patient left AMA  Chest pain  -Patient counseled on the sequela of not following is to finish the workup to identify cause to include death. , Patient left AMA  Bilateral hand swelling with skin lesions   -Patient counseled on the sequela of not following is to finish the workup to identify cause to include death. , Patient left AMA  Diabetes mellitus  -Patient counseled on the sequela of not following is to finish  the workup to identify cause to include death. , Patient left AMA  OSA noncompliant with CPAP - -Patient counseled on the sequela of not following is to finish the workup to identify cause to include death. , Patient left AMA  Tobacco abuse  -Patient counseled on the sequela of not following is to finish the workup to identify cause to include death. , Patient left AMA  History of asthma -Patient counseled on the sequela of not following is to finish the workup to identify cause to include death. , Patient left AMA  Hyperlipidemia -Patient counseled on the sequela of not following is to finish the workup to identify cause to include death. , Patient left AMA  ADHD   -Patient counseled on the sequela of not following is to finish the workup to identify cause to include death. , Patient left AMA- continue present medications.     Code Status: FULL Family Communication: no family present at time of exam Disposition Plan: Left AMA   Consultants: Dr. Thana Farr (neurology)    Procedure/Significant Events: 6/8 CT head without contrast; No evidence of acute intracranial abnormality. 6/8 CT angiogram PE protocol; multiple pulmonary nodules since 2011 see below --4 mm subpleural right lower lobe nodule (series 8/ image 47)  --7 mm posterior right lower lobe nodule (series 8/ image 47)  --5 mm subpleural left lower lobe nodule (series 8/image 49) No evidence thoracoabdominal aortic aneurysm/dissection or PE.  6/8 DG hand complete right; old fracture deformity of the fifth metacarpal bone. 6/8 DG hand complete left; negative findings  Culture NA  Antibiotics: Doxycycline 6/8 >> stopped 6/9  DVT prophylaxis: Lovenox   Devices NA   LINES / TUBES:  NA    Continuous Infusions:   Objective: VITAL SIGNS: Temp: 97.9 F (36.6 C) (06/09 1200) Temp src: Oral (06/09 1200) BP: 130/92 mmHg (06/09 1200) Pulse Rate: 73 (06/09 1200) SPO2; FIO2:   Intake/Output Summary  (Last 24 hours) at 08/07/13 1250 Last data filed at 08/07/13 1200  Gross per 24 hour  Intake    360 ml  Output   1750 ml  Net  -1390 ml     Exam: General: A./O. x4, NAD, No acute respiratory distress Lungs: Clear to auscultation bilaterally without wheezes or crackles Cardiovascular: Regular rate and rhythm without murmur gallop or rub normal S1 and S2 Abdomen: Nontender, nondistended, soft, bowel sounds positive, no rebound, no ascites, no appreciable mass Extremities: No significant cyanosis, clubbing, or edema bilateral lower extremities Neurologic; cranial nerves II through XII intact, tongue/uvula midline, strength 5/5 all extremity, sensation intact throughout negative Romberg negative pronator drift.  Data Reviewed: Basic Metabolic Panel:  Recent Labs Lab 08/06/13 1831  NA 136*  K 4.0  CL 97  CO2 27  GLUCOSE 139*  BUN 6  CREATININE 0.75  CALCIUM 9.0   Liver Function Tests:  Recent Labs Lab 08/06/13 1831  AST 24  ALT 23  ALKPHOS 62  BILITOT 0.5  PROT 7.7  ALBUMIN 3.3*   No results found for this basename: LIPASE, AMYLASE,  in the last 168 hours No results found for this basename: AMMONIA,  in the last 168 hours CBC:  Recent Labs Lab 08/06/13 1831  WBC 13.3*  HGB 14.6  HCT 42.4  MCV 91.0  PLT 256   Cardiac Enzymes:  Recent Labs Lab 08/06/13 2346 08/07/13 0450 08/07/13 1028  TROPONINI <0.30 <0.30 <0.30   BNP (last 3 results) No results found for this basename: PROBNP,  in the last 8760 hours CBG:  Recent Labs Lab 08/06/13 2227 08/07/13 0811 08/07/13 1206  GLUCAP 92 135* 124*    Recent Results (from the past 240 hour(s))  MRSA PCR SCREENING     Status: None   Collection Time    08/06/13 10:28 PM      Result Value Ref Range Status   MRSA by PCR NEGATIVE  NEGATIVE Final   Comment:            The GeneXpert MRSA Assay (FDA     approved for NASAL specimens     only), is one component of a     comprehensive MRSA colonization      surveillance program. It is not     intended to diagnose MRSA     infection nor to guide or     monitor treatment for     MRSA infections.     Studies:  Recent x-ray studies have been reviewed in detail by the Attending Physician  Scheduled Meds:  Scheduled Meds: . aspirin  300 mg Rectal Daily   Or  . aspirin  325 mg Oral Daily  . citalopram  10 mg Oral Daily  . cloNIDine  0.2 mg Oral QHS  . doxycycline  100 mg Oral Q12H  . enoxaparin (LOVENOX) injection  40 mg Subcutaneous QHS  . insulin aspart  0-9 Units Subcutaneous TID WC  . linagliptin  5 mg Oral Daily  . lisinopril  20 mg Oral Daily  . mometasone-formoterol  2 puff Inhalation BID  . OLANZapine zydis  10 mg Oral  QHS  . pantoprazole  40 mg Oral Daily  . simvastatin  20 mg Oral q1800  . [START ON 08/08/2013] terbinafine  250 mg Oral Daily    Time spent on care of this patient: 40 mins   Drema Dallas , MD   [facility] Office  [phone] Pager [phone]  On-Call/Text Page:      [email or web address]      password [email or web address]  If 7PM-7AM, please contact night-coverage [email or web address] Password [email or web address] 08/07/2013, 12:50 PM   LOS: 1 day

## 2013-08-07 NOTE — Progress Notes (Signed)
*  PRELIMINARY RESULTS* Vascular Ultrasound Carotid Duplex (Doppler) has been completed.  Preliminary findings: Bilateral:  1-39% ICA stenosis.  Vertebral artery flow is antegrade.      Farrel Demark, RDMS, RVT  08/07/2013, 10:31 AM

## 2013-08-08 LAB — HEMOGLOBIN A1C
Hgb A1c MFr Bld: 6.2 % — ABNORMAL HIGH (ref <5.7)
Mean Plasma Glucose: 131 mg/dL — ABNORMAL HIGH (ref <117)

## 2013-09-02 ENCOUNTER — Observation Stay (HOSPITAL_COMMUNITY)
Admission: EM | Admit: 2013-09-02 | Discharge: 2013-09-04 | Disposition: A | Discharge status: Home / Self Care | Payer: Medicare Other | Attending: Internal Medicine | Admitting: Internal Medicine

## 2013-09-02 ENCOUNTER — Emergency Department (HOSPITAL_COMMUNITY): Payer: Medicare Other

## 2013-09-02 ENCOUNTER — Encounter (HOSPITAL_COMMUNITY): Payer: Self-pay | Admitting: Emergency Medicine

## 2013-09-02 DIAGNOSIS — E089 Diabetes mellitus due to underlying condition without complications: Secondary | ICD-10-CM

## 2013-09-02 DIAGNOSIS — G459 Transient cerebral ischemic attack, unspecified: Principal | ICD-10-CM | POA: Diagnosis present

## 2013-09-02 DIAGNOSIS — F209 Schizophrenia, unspecified: Secondary | ICD-10-CM | POA: Diagnosis not present

## 2013-09-02 DIAGNOSIS — E783 Hyperchylomicronemia: Secondary | ICD-10-CM | POA: Diagnosis not present

## 2013-09-02 DIAGNOSIS — J45909 Unspecified asthma, uncomplicated: Secondary | ICD-10-CM | POA: Insufficient documentation

## 2013-09-02 DIAGNOSIS — G8929 Other chronic pain: Secondary | ICD-10-CM | POA: Insufficient documentation

## 2013-09-02 DIAGNOSIS — Z91199 Patient's noncompliance with other medical treatment and regimen due to unspecified reason: Secondary | ICD-10-CM | POA: Insufficient documentation

## 2013-09-02 DIAGNOSIS — F319 Bipolar disorder, unspecified: Secondary | ICD-10-CM | POA: Diagnosis not present

## 2013-09-02 DIAGNOSIS — R109 Unspecified abdominal pain: Secondary | ICD-10-CM | POA: Insufficient documentation

## 2013-09-02 DIAGNOSIS — E1149 Type 2 diabetes mellitus with other diabetic neurological complication: Secondary | ICD-10-CM

## 2013-09-02 DIAGNOSIS — F29 Unspecified psychosis not due to a substance or known physiological condition: Secondary | ICD-10-CM | POA: Diagnosis present

## 2013-09-02 DIAGNOSIS — I10 Essential (primary) hypertension: Secondary | ICD-10-CM | POA: Diagnosis not present

## 2013-09-02 DIAGNOSIS — G4700 Insomnia, unspecified: Secondary | ICD-10-CM | POA: Insufficient documentation

## 2013-09-02 DIAGNOSIS — F909 Attention-deficit hyperactivity disorder, unspecified type: Secondary | ICD-10-CM | POA: Insufficient documentation

## 2013-09-02 DIAGNOSIS — R531 Weakness: Secondary | ICD-10-CM | POA: Diagnosis present

## 2013-09-02 DIAGNOSIS — Z9119 Patient's noncompliance with other medical treatment and regimen: Secondary | ICD-10-CM | POA: Diagnosis not present

## 2013-09-02 DIAGNOSIS — F17200 Nicotine dependence, unspecified, uncomplicated: Secondary | ICD-10-CM | POA: Diagnosis not present

## 2013-09-02 DIAGNOSIS — E119 Type 2 diabetes mellitus without complications: Secondary | ICD-10-CM | POA: Diagnosis present

## 2013-09-02 DIAGNOSIS — G4733 Obstructive sleep apnea (adult) (pediatric): Secondary | ICD-10-CM | POA: Diagnosis present

## 2013-09-02 DIAGNOSIS — Z9989 Dependence on other enabling machines and devices: Secondary | ICD-10-CM

## 2013-09-02 DIAGNOSIS — Z6841 Body Mass Index (BMI) 40.0 and over, adult: Secondary | ICD-10-CM | POA: Insufficient documentation

## 2013-09-02 DIAGNOSIS — M549 Dorsalgia, unspecified: Secondary | ICD-10-CM | POA: Diagnosis not present

## 2013-09-02 DIAGNOSIS — F329 Major depressive disorder, single episode, unspecified: Secondary | ICD-10-CM | POA: Diagnosis present

## 2013-09-02 DIAGNOSIS — F32A Depression, unspecified: Secondary | ICD-10-CM

## 2013-09-02 DIAGNOSIS — E785 Hyperlipidemia, unspecified: Secondary | ICD-10-CM | POA: Diagnosis not present

## 2013-09-02 DIAGNOSIS — K219 Gastro-esophageal reflux disease without esophagitis: Secondary | ICD-10-CM | POA: Diagnosis not present

## 2013-09-02 DIAGNOSIS — G8190 Hemiplegia, unspecified affecting unspecified side: Secondary | ICD-10-CM | POA: Diagnosis present

## 2013-09-02 LAB — GLUCOSE, CAPILLARY
GLUCOSE-CAPILLARY: 126 mg/dL — AB (ref 70–99)
Glucose-Capillary: 111 mg/dL — ABNORMAL HIGH (ref 70–99)

## 2013-09-02 LAB — RAPID URINE DRUG SCREEN, HOSP PERFORMED
AMPHETAMINES: NOT DETECTED
BARBITURATES: NOT DETECTED
Benzodiazepines: NOT DETECTED
Cocaine: NOT DETECTED
Opiates: POSITIVE — AB
TETRAHYDROCANNABINOL: NOT DETECTED

## 2013-09-02 LAB — COMPREHENSIVE METABOLIC PANEL
ALT: 20 U/L (ref 0–53)
AST: 21 U/L (ref 0–37)
Albumin: 3.4 g/dL — ABNORMAL LOW (ref 3.5–5.2)
Alkaline Phosphatase: 53 U/L (ref 39–117)
Anion gap: 13 (ref 5–15)
BILIRUBIN TOTAL: 0.4 mg/dL (ref 0.3–1.2)
BUN: 4 mg/dL — ABNORMAL LOW (ref 6–23)
CHLORIDE: 98 meq/L (ref 96–112)
CO2: 27 mEq/L (ref 19–32)
Calcium: 9.2 mg/dL (ref 8.4–10.5)
Creatinine, Ser: 0.72 mg/dL (ref 0.50–1.35)
GFR calc Af Amer: >90 mL/min (ref >90)
GFR calc non Af Amer: >90 mL/min (ref >90)
Glucose, Bld: 156 mg/dL — ABNORMAL HIGH (ref 70–99)
Potassium: 4.2 mEq/L (ref 3.7–5.3)
SODIUM: 138 meq/L (ref 137–147)
Total Protein: 7.5 g/dL (ref 6.0–8.3)

## 2013-09-02 LAB — LIPASE, BLOOD: Lipase: 15 U/L (ref 11–59)

## 2013-09-02 LAB — URINALYSIS, ROUTINE W REFLEX MICROSCOPIC
BILIRUBIN URINE: NEGATIVE
GLUCOSE, UA: NEGATIVE mg/dL
Hgb urine dipstick: NEGATIVE
KETONES UR: NEGATIVE mg/dL
Leukocytes, UA: NEGATIVE
Nitrite: NEGATIVE
PH: 8.5 — AB (ref 5.0–8.0)
Protein, ur: NEGATIVE mg/dL
Specific Gravity, Urine: 1.016 (ref 1.005–1.030)
Urobilinogen, UA: 1 mg/dL (ref 0.0–1.0)

## 2013-09-02 LAB — I-STAT CHEM 8, ED
CREATININE: 0.8 mg/dL (ref 0.50–1.35)
Calcium, Ion: 1.15 mmol/L (ref 1.12–1.23)
Chloride: 100 mEq/L (ref 96–112)
Glucose, Bld: 153 mg/dL — ABNORMAL HIGH (ref 70–99)
HCT: 45 % (ref 39.0–52.0)
Hemoglobin: 15.3 g/dL (ref 13.0–17.0)
Potassium: 4.1 mEq/L (ref 3.7–5.3)
SODIUM: 139 meq/L (ref 137–147)
TCO2: 29 mmol/L (ref 0–100)

## 2013-09-02 LAB — I-STAT TROPONIN, ED: Troponin i, poc: 0 ng/mL (ref 0.00–0.08)

## 2013-09-02 LAB — CBC
HEMATOCRIT: 41 % (ref 39.0–52.0)
Hemoglobin: 14 g/dL (ref 13.0–17.0)
MCH: 30.9 pg (ref 26.0–34.0)
MCHC: 34.1 g/dL (ref 30.0–36.0)
MCV: 90.5 fL (ref 78.0–100.0)
Platelets: 239 10*3/uL (ref 150–400)
RBC: 4.53 MIL/uL (ref 4.22–5.81)
RDW: 13.9 % (ref 11.5–15.5)
WBC: 7.6 10*3/uL (ref 4.0–10.5)

## 2013-09-02 LAB — SALICYLATE LEVEL

## 2013-09-02 LAB — PROTIME-INR
INR: 1.01 (ref 0.00–1.49)
Prothrombin Time: 13.3 seconds (ref 11.6–15.2)

## 2013-09-02 LAB — ETHANOL

## 2013-09-02 LAB — CLOSTRIDIUM DIFFICILE BY PCR: Toxigenic C. Difficile by PCR: NEGATIVE

## 2013-09-02 LAB — I-STAT CG4 LACTIC ACID, ED: Lactic Acid, Venous: 1.75 mmol/L (ref 0.5–2.2)

## 2013-09-02 LAB — ACETAMINOPHEN LEVEL: Acetaminophen (Tylenol), Serum: <15 ug/mL (ref 10–30)

## 2013-09-02 MED ORDER — OXYCODONE HCL 5 MG PO TABS
5.0000 mg | ORAL_TABLET | Freq: Four times a day (QID) | ORAL | Status: DC | PRN
  Administered 2013-09-02 (×2): 5 mg via ORAL
  Filled 2013-09-02 (×2): qty 1

## 2013-09-02 MED ORDER — SODIUM CHLORIDE 0.9 % IV BOLUS (SEPSIS)
500.0000 mL | Freq: Once | INTRAVENOUS | Status: AC
  Administered 2013-09-02: 500 mL via INTRAVENOUS

## 2013-09-02 MED ORDER — ASPIRIN 325 MG PO TABS
325.0000 mg | ORAL_TABLET | Freq: Every day | ORAL | Status: DC
  Administered 2013-09-02 – 2013-09-04 (×3): 325 mg via ORAL
  Filled 2013-09-02 (×3): qty 1

## 2013-09-02 MED ORDER — SIMVASTATIN 20 MG PO TABS
20.0000 mg | ORAL_TABLET | Freq: Every day | ORAL | Status: DC
  Administered 2013-09-02 – 2013-09-03 (×2): 20 mg via ORAL
  Filled 2013-09-02 (×2): qty 1

## 2013-09-02 MED ORDER — OXYCODONE HCL 5 MG PO TABS
30.0000 mg | ORAL_TABLET | Freq: Four times a day (QID) | ORAL | Status: DC | PRN
  Administered 2013-09-02 – 2013-09-03 (×3): 30 mg via ORAL
  Filled 2013-09-02 (×3): qty 6

## 2013-09-02 MED ORDER — SODIUM CHLORIDE 0.9 % IJ SOLN: 3.0000 mL | INTRAMUSCULAR | Status: DC | PRN

## 2013-09-02 MED ORDER — CLONIDINE HCL 0.1 MG PO TABS
0.2000 mg | ORAL_TABLET | Freq: Every day | ORAL | Status: DC
  Administered 2013-09-02 – 2013-09-03 (×2): 0.2 mg via ORAL
  Filled 2013-09-02 (×3): qty 2

## 2013-09-02 MED ORDER — METFORMIN HCL 500 MG PO TABS: 500.0000 mg | ORAL_TABLET | Freq: Two times a day (BID) | ORAL | Status: DC

## 2013-09-02 MED ORDER — AMPHETAMINE-DEXTROAMPHETAMINE 10 MG PO TABS
20.0000 mg | ORAL_TABLET | Freq: Every day | ORAL | Status: DC
  Administered 2013-09-03 – 2013-09-04 (×2): 20 mg via ORAL
  Filled 2013-09-02 (×3): qty 2

## 2013-09-02 MED ORDER — ALPRAZOLAM 0.5 MG PO TABS
1.0000 mg | ORAL_TABLET | Freq: Four times a day (QID) | ORAL | Status: DC | PRN
  Administered 2013-09-02 – 2013-09-04 (×8): 1 mg via ORAL
  Filled 2013-09-02 (×8): qty 2

## 2013-09-02 MED ORDER — FENTANYL CITRATE 0.05 MG/ML IJ SOLN: 50.0000 ug | INTRAMUSCULAR | Status: DC | PRN

## 2013-09-02 MED ORDER — IOHEXOL 350 MG/ML SOLN
100.0000 mL | Freq: Once | INTRAVENOUS | Status: AC | PRN
  Administered 2013-09-02: 100 mL via INTRAVENOUS

## 2013-09-02 MED ORDER — CITALOPRAM HYDROBROMIDE 10 MG PO TABS
10.0000 mg | ORAL_TABLET | Freq: Every day | ORAL | Status: DC
  Administered 2013-09-02 – 2013-09-04 (×3): 10 mg via ORAL
  Filled 2013-09-02 (×3): qty 1

## 2013-09-02 MED ORDER — HYDROMORPHONE HCL PF 1 MG/ML IJ SOLN
1.0000 mg | Freq: Once | INTRAMUSCULAR | Status: AC
  Administered 2013-09-02: 1 mg via INTRAVENOUS
  Filled 2013-09-02: qty 1

## 2013-09-02 MED ORDER — ONDANSETRON HCL 4 MG/2ML IJ SOLN: 4.0000 mg | Freq: Three times a day (TID) | INTRAMUSCULAR | Status: DC | PRN

## 2013-09-02 MED ORDER — MOMETASONE FURO-FORMOTEROL FUM 100-5 MCG/ACT IN AERO
2.0000 | INHALATION_SPRAY | Freq: Two times a day (BID) | RESPIRATORY_TRACT | Status: DC
  Administered 2013-09-02 – 2013-09-04 (×4): 2 via RESPIRATORY_TRACT
  Filled 2013-09-02: qty 8.8

## 2013-09-02 MED ORDER — SODIUM CHLORIDE 0.9 % IV SOLN: 250.0000 mL | INTRAVENOUS | Status: DC | PRN

## 2013-09-02 MED ORDER — ZOLPIDEM TARTRATE 5 MG PO TABS
10.0000 mg | ORAL_TABLET | Freq: Every evening | ORAL | Status: DC | PRN
  Administered 2013-09-02 – 2013-09-03 (×2): 10 mg via ORAL
  Filled 2013-09-02 (×2): qty 2

## 2013-09-02 MED ORDER — LINAGLIPTIN 5 MG PO TABS
5.0000 mg | ORAL_TABLET | Freq: Every day | ORAL | Status: DC
  Administered 2013-09-02 – 2013-09-04 (×3): 5 mg via ORAL
  Filled 2013-09-02 (×3): qty 1

## 2013-09-02 MED ORDER — SODIUM CHLORIDE 0.9 % IJ SOLN
3.0000 mL | Freq: Two times a day (BID) | INTRAMUSCULAR | Status: DC
  Administered 2013-09-02 – 2013-09-03 (×2): 3 mL via INTRAVENOUS

## 2013-09-02 MED ORDER — PNEUMOCOCCAL VAC POLYVALENT 25 MCG/0.5ML IJ INJ: 0.5000 mL | INJECTION | INTRAMUSCULAR | Status: DC

## 2013-09-02 MED ORDER — FENTANYL CITRATE 0.05 MG/ML IJ SOLN
INTRAMUSCULAR | Status: AC
  Administered 2013-09-02: 100 ug
  Filled 2013-09-02: qty 2

## 2013-09-02 MED ORDER — SITAGLIPTIN PHOS-METFORMIN HCL 50-500 MG PO TABS: 1.0000 | ORAL_TABLET | Freq: Two times a day (BID) | ORAL | Status: DC

## 2013-09-02 MED ORDER — STROKE: EARLY STAGES OF RECOVERY BOOK
Freq: Once | Status: AC
  Administered 2013-09-02: 14:00:00
  Filled 2013-09-02: qty 1

## 2013-09-02 MED ORDER — LISINOPRIL 20 MG PO TABS
20.0000 mg | ORAL_TABLET | Freq: Every day | ORAL | Status: DC
  Administered 2013-09-02 – 2013-09-04 (×3): 20 mg via ORAL
  Filled 2013-09-02 (×3): qty 1

## 2013-09-02 MED ORDER — PANTOPRAZOLE SODIUM 40 MG PO TBEC
40.0000 mg | DELAYED_RELEASE_TABLET | Freq: Two times a day (BID) | ORAL | Status: DC
  Administered 2013-09-02 – 2013-09-04 (×4): 40 mg via ORAL
  Filled 2013-09-02 (×6): qty 1

## 2013-09-02 MED ORDER — SODIUM CHLORIDE 0.9 % IV SOLN
INTRAVENOUS | Status: AC
  Administered 2013-09-02: 12:00:00 via INTRAVENOUS

## 2013-09-02 MED ORDER — OLANZAPINE 10 MG PO TBDP
10.0000 mg | ORAL_TABLET | Freq: Every day | ORAL | Status: DC
  Administered 2013-09-02 – 2013-09-03 (×2): 10 mg via ORAL
  Filled 2013-09-02 (×4): qty 1

## 2013-09-02 MED ORDER — ASPIRIN 81 MG PO CHEW: 324.0000 mg | CHEWABLE_TABLET | Freq: Once | ORAL | Status: DC

## 2013-09-02 MED ORDER — HEPARIN SODIUM (PORCINE) 5000 UNIT/ML IJ SOLN
5000.0000 [IU] | Freq: Three times a day (TID) | INTRAMUSCULAR | Status: DC
  Administered 2013-09-02 – 2013-09-04 (×6): 5000 [IU] via SUBCUTANEOUS
  Filled 2013-09-02 (×7): qty 1

## 2013-09-02 MED ORDER — INSULIN ASPART 100 UNIT/ML ~~LOC~~ SOLN
0.0000 [IU] | Freq: Three times a day (TID) | SUBCUTANEOUS | Status: DC
  Administered 2013-09-02 – 2013-09-03 (×4): 3 [IU] via SUBCUTANEOUS

## 2013-09-02 NOTE — Progress Notes (Signed)
Patient and companion seen smoking in room on cameras.  This charge nurse went into room and instructed patient that he had to cease smoking immediately.  Also, instructed him that if he failed to cease smoking that we would request an order for discharge.  Patient was informed that he was imperiling the health and welfare of the patients and the staff.  Security was also called to reinforce information with patient.  Security removed cigarettes and lighter from patient's possession.

## 2013-09-02 NOTE — Progress Notes (Signed)
Patient arrived to 4N02 from ED via stretcher. Placed on tele, Q2 vitals and neuro checks started at 1200 per protocol. NIH 5, still c/o left sided weakness and numbness. Unaware of being touched on left side. When vision tested he has problems with left peripheral vision and and had left field cuts in bilateral eyes. Passed stroke swallow screen. Patient very anxious states "he wants his xanax and his oxycodone and that he keeps having diarrhea because we are not giving him his pain medicine." Per ED RN patient received 100mcg fentanyl and 1 mg dilaudid for RUQ ABD pain. Dr Gwenlyn PerkingMadera notified upon patients arrival to floor and to see patient soon. No new orders at this time. Call bell within reach, will continue to monitor.  Charne Mcbrien, [place], RN 09/02/2013 [phone]

## 2013-09-02 NOTE — Consult Note (Addendum)
Referring Physician: Jodi Mourning    Chief Complaint: Left sided numbness and weakness  HPI: Bryan Gallegos is an 38 y.o. male who was admitted on 6/8 due to complaints of left sided numbness and weakness.  Patient left AMA from that hospitalization secondary to some issues with his wife.  He reports that although his symptoms did not completely resolve by the time he left, the weakness improved over time and the numbness had resolved.  He went to bed at baseline on yesterday.  He reports that some time during the night he got up and must have passed out and fallen to the floor.  He came to at 0400 but was unable to get out of the floor.  EMS was called at that time.  He reports that on regaining consciousness he noted return of the numbness on his left side.   Lipid panel on 08/07/13 was 119.  A1c on 08/07/13 was 6.1.  Carotid doppler on 08/07/13 showed no hemodynamically significant stenosis. Echocardiogram was not performed.  Patient was placed on aspirin at that time.   Date last known well: Date: 09/01/2013 Time last known well: Time: 20:00 tPA Given: No: Outside time window  Past Medical History  Diagnosis Date  . Heart valve disorder   . Hypertension   . Asthma   . Diabetes mellitus   . Hyperlipidemia   . Anaphylactic reaction   . Bipolar disorder   . Adult ADHD   . Morbidly obese     Past Surgical History  Procedure Laterality Date  . Carpal tunnel release    . Nose surgery      Family History  Problem Relation Age of Onset  . CAD Mother   . Diabetes Mellitus II Mother   . Stroke Mother    Social History:  reports that he has been smoking Cigarettes.  He has a 23 pack-year smoking history. He has never used smokeless tobacco. He reports that he does not drink alcohol or use illicit drugs.  Allergies:  Allergies  Allergen Reactions  . Atenolol Anaphylaxis  . Other Anaphylaxis    "Prednisone like drugs"  . Tylenol [Acetaminophen] Other (See Comments)    unknown     Medications: I have reviewed the patient's current medications. Patient reports not recently being compliant with medications.   Prior to Admission:  No current facility-administered medications for this encounter. Current outpatient prescriptions:ALPRAZolam (XANAX) 1 MG tablet, Take 1 mg by mouth 5 (five) times daily as needed for anxiety. , Disp: , Rfl: ;  amphetamine-dextroamphetamine (ADDERALL) 20 MG tablet, Take 40 mg by mouth daily as needed (for additional focus). , Disp: , Rfl: ;  citalopram (CELEXA) 10 MG tablet, Take 10 mg by mouth daily. , Disp: , Rfl: ;  cloNIDine (CATAPRES) 0.2 MG tablet, Take 0.2 mg by mouth at bedtime. , Disp: , Rfl:  Fluticasone-Salmeterol (ADVAIR) 250-50 MCG/DOSE AEPB, Inhale 1 puff into the lungs every 12 (twelve) hours., Disp: , Rfl: ;  lisinopril (PRINIVIL,ZESTRIL) 20 MG tablet, Take 20 mg by mouth daily., Disp: , Rfl: ;  OLANZapine zydis (ZYPREXA) 10 MG disintegrating tablet, Take 10 mg by mouth at bedtime. , Disp: , Rfl: ;  omeprazole (PRILOSEC) 40 MG capsule, Take 40 mg by mouth daily. , Disp: , Rfl:  oxycodone (ROXICODONE) 30 MG immediate release tablet, Take 30 mg by mouth every 4 (four) hours. For pain., Disp: , Rfl: ;  pravastatin (PRAVACHOL) 40 MG tablet, Take 40 mg by mouth at bedtime. , Disp: ,  Rfl: ;  sitaGLIPtan-metformin (JANUMET) 50-500 MG per tablet, Take 1 tablet by mouth 2 (two) times daily with a meal., Disp: , Rfl: ;  terbinafine (LAMISIL) 250 MG tablet, Take 250 mg by mouth daily. , Disp: , Rfl:  zolpidem (AMBIEN) 10 MG tablet, Take 10 mg by mouth at bedtime as needed for sleep., Disp: , Rfl: ;  clindamycin (CLEOCIN) 150 MG capsule, Take 300 mg by mouth 2 (two) times daily., Disp: , Rfl: ;  oxyCODONE (ROXICODONE) 5 MG immediate release tablet, Take 1 tablet (5 mg total) by mouth every 4 (four) hours as needed for moderate pain or severe pain., Disp: 30 tablet, Rfl: 0  ROS: History obtained from the patient  General ROS: negative for - chills,  fatigue, fever, night sweats, weight gain or weight loss Psychological ROS: negative for - behavioral disorder, hallucinations, memory difficulties, mood swings or suicidal ideation Ophthalmic ROS: negative for - blurry vision, double vision, eye pain or loss of vision ENT ROS: negative for - epistaxis, nasal discharge, oral lesions, sore throat, tinnitus or vertigo Allergy and Immunology ROS: negative for - hives or itchy/watery eyes Hematological and Lymphatic ROS: negative for - bleeding problems, bruising or swollen lymph nodes Endocrine ROS: negative for - galactorrhea, hair pattern changes, polydipsia/polyuria or temperature intolerance Respiratory ROS: negative for - cough, hemoptysis, shortness of breath or wheezing Cardiovascular ROS: negative for - chest pain, dyspnea on exertion, edema or irregular heartbeat Gastrointestinal ROS: abdominal pain, diarrhea Genito-Urinary ROS: negative for - dysuria, hematuria, incontinence or urinary frequency/urgency Musculoskeletal ROS: negative for - joint swelling or muscular weakness Neurological ROS: as noted in HPI Dermatological ROS: skin lesions on extremities   Physical Examination: Blood pressure 141/71, pulse 80, temperature 98.7 F (37.1 C), temperature source Oral, resp. rate 15, SpO2 98.00%.  Neurologic Examination: Mental Status:  Alert, oriented, thought content appropriate. Speech fluent without evidence of aphasia. Able to follow 3 step commands without difficulty.  Cranial Nerves:  II: Discs flat bilaterally; Decreased peripheral vision on the left, pupils equal, round, reactive to light and accommodation  III,IV, VI: ptosis not present, extra-ocular motions intact bilaterally  V,VII: smile symmetric, facial light touch sensation decreased on the left  VIII: hearing normal bilaterally  IX,X: gag reflex present  XI: bilateral shoulder shrug  XII: midline tongue extension  Motor:  Right : Upper extremity 5/5     Left: Upper  extremity 5-/5   Lower extremity 5/5             Lower extremity 4-/5  Tone and bulk:normal tone throughout; no atrophy noted  Sensory: Pinprick and light touch decreased on the left  Deep Tendon Reflexes: 2+ and symmetric throughout  Plantars:  Right: downgoing     Left: downgoing  Cerebellar:  normal finger-to-nose testing Gait: Unable to test  CV: pulses palpable throughout    Laboratory Studies:  Basic Metabolic Panel:  Recent Labs Lab 09/02/13 0839 09/02/13 0849  NA 138 139  K 4.2 4.1  CL 98 100  CO2 27  --   GLUCOSE 156* 153*  BUN 4* <3*  CREATININE 0.72 0.80  CALCIUM 9.2  --     Liver Function Tests:  Recent Labs Lab 09/02/13 0839  AST 21  ALT 20  ALKPHOS 53  BILITOT 0.4  PROT 7.5  ALBUMIN 3.4*    Recent Labs Lab 09/02/13 0839  LIPASE 15   No results found for this basename: AMMONIA,  in the last 168 hours  CBC:  Recent Labs  Lab 09/02/13 0839 09/02/13 0849  WBC 7.6  --   HGB 14.0 15.3  HCT 41.0 45.0  MCV 90.5  --   PLT 239  --     Cardiac Enzymes: No results found for this basename: CKTOTAL, CKMB, CKMBINDEX, TROPONINI,  in the last 168 hours  BNP: No components found with this basename: POCBNP,   CBG: No results found for this basename: GLUCAP,  in the last 168 hours  Microbiology: Results for orders placed during the hospital encounter of 08/06/13  MRSA PCR SCREENING     Status: None   Collection Time    08/06/13 10:28 PM      Result Value Ref Range Status   MRSA by PCR NEGATIVE  NEGATIVE Final   Comment:            The GeneXpert MRSA Assay (FDA     approved for NASAL specimens     only), is one component of a     comprehensive MRSA colonization     surveillance program. It is not     intended to diagnose MRSA     infection nor to guide or     monitor treatment for     MRSA infections.    Coagulation Studies:  Recent Labs  09/02/13 0839  LABPROT 13.3  INR 1.01    Urinalysis:  Recent Labs Lab 09/02/13 0903   COLORURINE YELLOW  LABSPEC 1.016  PHURINE 8.5*  GLUCOSEU NEGATIVE  HGBUR NEGATIVE  BILIRUBINUR NEGATIVE  KETONESUR NEGATIVE  PROTEINUR NEGATIVE  UROBILINOGEN 1.0  NITRITE NEGATIVE  LEUKOCYTESUR NEGATIVE    Lipid Panel:    Component Value Date/Time   CHOL 180 08/07/2013 0450   TRIG 216* 08/07/2013 0450   HDL 18* 08/07/2013 0450   CHOLHDL 10.0 08/07/2013 0450   VLDL 43* 08/07/2013 0450   LDLCALC 119* 08/07/2013 0450    HgbA1C:  Lab Results  Component Value Date   HGBA1C 6.2* 08/07/2013    Urine Drug Screen:     Component Value Date/Time   LABOPIA POSITIVE* 04/07/2012 2048   COCAINSCRNUR NONE DETECTED 04/07/2012 2048   LABBENZ POSITIVE* 04/07/2012 2048   AMPHETMU NONE DETECTED 04/07/2012 2048   THCU NONE DETECTED 04/07/2012 2048   LABBARB NONE DETECTED 04/07/2012 2048    Alcohol Level:  Recent Labs Lab 09/02/13 0839  ETH <11    Other results: EKG: sinus rhythm at 83 bpm.  Imaging: Ct Head Wo Contrast  09/02/2013   CLINICAL DATA:  Left-sided weakness and numbness with history of diabetes and valvular heart disease  EXAM: CT HEAD WITHOUT CONTRAST  TECHNIQUE: Contiguous axial images were obtained from the base of the skull through the vertex without intravenous contrast.  COMPARISON:  CT scan of the brain of August 06, 2013  FINDINGS: The ventricles are normal in size and position. There is no intracranial hemorrhage, mass effect, nor acute ischemic change. The cerebellum and brainstem are normal.  There is mucoperiosteal thickening in the right maxillary sinus. The remainder the visualized paranasal sinuses and mastoid air cells are clear. There is no skull fracture or lytic or blastic skull lesion.  IMPRESSION: There is no acute intracranial abnormality. There is chronic opacification of a portion of the right maxillary sinus.   Electronically Signed   By: David  [place]   On: 09/02/2013 09:27   Ct Angio Chest Aorta W/cm &/or Wo/cm  09/02/2013   CLINICAL DATA:  Right sided abdominal pain  described as a tearing pain, radiating to the back.  Left-sided weakness and numbness. Evaluate for possible dissection. Current history of diabetes, hypertension, asthma, and unspecified heart valve disorder.  EXAM: CT ANGIOGRAPHY CHEST, ABDOMEN AND PELVIS  TECHNIQUE: Multidetector CT imaging through the chest, abdomen and pelvis was performed using the standard protocol before and during bolus administration of intravenous contrast. Multiplanar reconstructed images and MIPs were obtained and reviewed to evaluate the vascular anatomy.  CONTRAST:  100mL OMNIPAQUE IOHEXOL 350 MG/ML IV.  COMPARISON:  No prior CT chest. Multiple CTs of the abdomen and pelvis dating back to 09/18/2008, most recently 04/26/2013.  FINDINGS: CTA CHEST FINDINGS  Abundant image noise due to body habitus. Unenhanced images demonstrate no mural hematoma in the thoracic aorta. No calcified plaque involving the thoracic aorta or proximal great vessels. No visible coronary calcification.  Enhanced images demonstrate no evidence of thoracic aortic aneurysm or dissection. No visible atherosclerotic plaque. Proximal great vessels widely patent.  Heart size normal. Pulmonary artery is not well opacified, but no large central emboli. No pericardial effusion.  Approximate 8 mm circumscribed nodule in the posterior right lower lobe (series 7, image 69), stable dating back to 2011. Peripheral polygonal approximate 6 mm nodules in the anterior right lower lobe adjacent to the minor fissure (image 69) and in the anterior right upper lobe (image 51), also unchanged. No new or enlarging pulmonary nodules. Lungs clear without localized airspace consolidation or interstitial disease. Central airways patent with mild bronchial wall thickening. No pleural effusions.  Scattered normal size mediastinal, hilar, and axillary lymph nodes. No significant lymphadenopathy. Visualized thyroid gland unremarkable.  Bone window images unremarkable.  Review of the MIP images  confirms the above findings.  CTA ABDOMEN AND PELVIS FINDINGS  Abundant image noise due to body habitus. No evidence of dissection of the abdominal aorta or the iliofemoral arteries. Solitary calcified plaque involving the left common femoral artery. No visible atherosclerotic plaque elsewhere. Single right renal artery and 2 left renal arteries, widely patent. Widely patent celiac artery, SMA and IMA.  Diffuse hepatic steatosis without focal hepatic parenchymal abnormality. Normal appearing spleen, pancreas, right adrenal gland, and kidneys. Approximate 1.6 x 1.5 x 1.8 cm low-attenuation nodule involving the left adrenal gland. Gallbladder unremarkable by CT. No biliary ductal dilation. No significant lymphadenopathy.  Normal appearing stomach and small bowel. Moderate stool burden in the rectum and sigmoid colon. Entire colon normal in appearance. Lipoma involving the ileocecal valve. Normal appearing decompressed appendix in the right upper pelvis. No ascites.  Urinary bladder decompressed and unremarkable. Prostate gland and seminal vesicles normal for age. Small left inguinal hernia containing fat.  Bone window images unremarkable.  Review of the MIP images confirms the above findings.  IMPRESSION: 1. No evidence of thoracic or abdominal aortic aneurysm or dissection. 2. Minimal atherosclerosis involving the left common femoral artery. No visible atherosclerotic disease elsewhere. 3. Stable right lung nodules dating back to 2011, consistent with benign granulomas and likely subpleural lymph nodes. 4.  No acute cardiopulmonary disease. 5. Hepatic steatosis. 6. No acute abnormalities involving the abdomen or pelvis. 7. Small left inguinal hernia containing fat. 8. Approximate 1.8 cm left adrenal nodule, statistically a benign adenoma.   Electronically Signed   By: Hulan Saashomas  Lawrence M.D.   On: 09/02/2013 09:46   Ct Cta Abd/pel W/cm &/or W/o Cm  09/02/2013   CLINICAL DATA:  Right sided abdominal pain described as  a tearing pain, radiating to the back. Left-sided weakness and numbness. Evaluate for possible dissection. Current history of diabetes, hypertension, asthma, and unspecified heart  valve disorder.  EXAM: CT ANGIOGRAPHY CHEST, ABDOMEN AND PELVIS  TECHNIQUE: Multidetector CT imaging through the chest, abdomen and pelvis was performed using the standard protocol before and during bolus administration of intravenous contrast. Multiplanar reconstructed images and MIPs were obtained and reviewed to evaluate the vascular anatomy.  CONTRAST:  OMNIPAQUE IOHEXOL 350 MG/ML IV.  COMPARISON:  No prior CT chest. Multiple CTs of the abdomen and pelvis dating back to 09/18/2008, most recently 04/26/2013.  FINDINGS: CTA CHEST FINDINGS  Abundant image noise due to body habitus. Unenhanced images demonstrate no mural hematoma in the thoracic aorta. No calcified plaque involving the thoracic aorta or proximal great vessels. No visible coronary calcification.  Enhanced images demonstrate no evidence of thoracic aortic aneurysm or dissection. No visible atherosclerotic plaque. Proximal great vessels widely patent.  Heart size normal. Pulmonary artery is not well opacified, but no large central emboli. No pericardial effusion.  Approximate 8 mm circumscribed nodule in the posterior right lower lobe (series 7, image 69), stable dating back to 2011. Peripheral polygonal approximate 6 mm nodules in the anterior right lower lobe adjacent to the minor fissure (image 69) and in the anterior right upper lobe (image 51), also unchanged. No new or enlarging pulmonary nodules. Lungs clear without localized airspace consolidation or interstitial disease. Central airways patent with mild bronchial wall thickening. No pleural effusions.  Scattered normal size mediastinal, hilar, and axillary lymph nodes. No significant lymphadenopathy. Visualized thyroid gland unremarkable.  Bone window images unremarkable.  Review of the MIP images confirms the  above findings.  CTA ABDOMEN AND PELVIS FINDINGS  Abundant image noise due to body habitus. No evidence of dissection of the abdominal aorta or the iliofemoral arteries. Solitary calcified plaque involving the left common femoral artery. No visible atherosclerotic plaque elsewhere. Single right renal artery and 2 left renal arteries, widely patent. Widely patent celiac artery, SMA and IMA.  Diffuse hepatic steatosis without focal hepatic parenchymal abnormality. Normal appearing spleen, pancreas, right adrenal gland, and kidneys. Approximate 1.6 x 1.5 x 1.8 cm low-attenuation nodule involving the left adrenal gland. Gallbladder unremarkable by CT. No biliary ductal dilation. No significant lymphadenopathy.  Normal appearing stomach and small bowel. Moderate stool burden in the rectum and sigmoid colon. Entire colon normal in appearance. Lipoma involving the ileocecal valve. Normal appearing decompressed appendix in the right upper pelvis. No ascites.  Urinary bladder decompressed and unremarkable. Prostate gland and seminal vesicles normal for age. Small left inguinal hernia containing fat.  Bone window images unremarkable.  Review of the MIP images confirms the above findings.  IMPRESSION: 1. No evidence of thoracic or abdominal aortic aneurysm or dissection. 2. Minimal atherosclerosis involving the left common femoral artery. No visible atherosclerotic disease elsewhere. 3. Stable right lung nodules dating back to 2011, consistent with benign granulomas and likely subpleural lymph nodes. 4.  No acute cardiopulmonary disease. 5. Hepatic steatosis. 6. No acute abnormalities involving the abdomen or pelvis. 7. Small left inguinal hernia containing fat. 8. Approximate 1.8 cm left adrenal nodule, statistically a benign adenoma.   Electronically Signed   By: Hulan Saas M.D.   On: 09/02/2013 09:46    Assessment: 38 y.o. male presenting with a left hemiparesis and left visual loss.  LKW of yesterday therefore  patient outside of the treatment window for tPA.  Head CT reviewed and shows no acute changes.  There also does not appear to be any new changes from his previous CT in June when he presented in a similar way.  Patient has not been compliant with medications.  Would not repeat previous work up.  Would complete those tests unable to be performed at that time, patient with multiple risk factors.      Stroke Risk Factors - diabetes mellitus, hyperlipidemia and hypertension  Plan: 1. PT consult, OT consult, Speech consult 2. Echocardiogram 3. Prophylactic therapy-Antiplatelet med: Aspirin - dose 325mg  daily to be restarted. 4. Compliance with statins stressed 5. Telemetry monitoring 6. Frequent neuro checks 7. MRI, MRA of the brain without contrast.    Case discussed with Dr. Karen Kitchens, MD [facility] [phone] 09/02/2013, 10:02 AM

## 2013-09-02 NOTE — ED Notes (Signed)
To CT now 

## 2013-09-02 NOTE — ED Notes (Signed)
Pt presents to ED via EMS from home with complaints of rt upper quadrant pain, left side of body numb and tingling and woke up face down. Pt stood up for EMS without difficulty.  EMS CBG 144. No IV present from EMS.

## 2013-09-02 NOTE — H&P (Signed)
[facility] History and Physical  Bryan Gallegos:[id number] DOB: 11/19/1975 DOA: 09/02/2013  Referring physician: Dr. Jodi Mourning PCP: PROVIDER NOT IN SYSTEM   Chief Complaint: left side numbness and weakness  HPI: Bryan Gallegos is a 38 y.o. male with PMH significant for HTN, HLD, diabetes, schizophrenia, depression/anxiety, morbidly obesity and asthma; who came to ED complaining of Left side weakness. Patient was admitted approx 1 month ago with similar complaints, at that time CT head neg, carotid dopplers w/o significant stenosis; MRI no completed due to body habitus; patient leave AMA prior to 2-D echo being completed; LDL 119 and A1C 6.1. Patient denies CP, SOB, nausea, vomiting, decrease apetite or any other new complaints. In ED repeated CT was negative. Patient reports symptoms already improving (especially weakness) [facility] called to admit patient for further evaluation and treatment.   Review of Systems:  Anxious; with positive back pain and abdominal pain   Past Medical History  Diagnosis Date  . Heart valve disorder   . Hypertension   . Asthma   . Diabetes mellitus   . Hyperlipidemia   . Anaphylactic reaction   . Bipolar disorder   . Adult ADHD   . Morbidly obese    Past Surgical History  Procedure Laterality Date  . Carpal tunnel release    . Nose surgery     Social History:  reports that he has been smoking Cigarettes.  He has a 23 pack-year smoking history. He has never used smokeless tobacco. He reports that he does not drink alcohol or use illicit drugs.  Allergies  Allergen Reactions  . Atenolol Anaphylaxis  . Other Anaphylaxis    "Prednisone like drugs"  . Tylenol [Acetaminophen] Other (See Comments)    unknown    Family History  Problem Relation Age of Onset  . CAD Mother   . Diabetes Mellitus II Mother   . Stroke Mother      Prior to Admission medications   Medication Sig Start Date End Date Taking? Authorizing Provider  ALPRAZolam  Prudy Feeler) 1 MG tablet Take 1 mg by mouth 5 (five) times daily as needed for anxiety.    Yes Historical Provider, MD  amphetamine-dextroamphetamine (ADDERALL) 20 MG tablet Take 40 mg by mouth daily as needed (for additional focus).    Yes Historical Provider, MD  citalopram (CELEXA) 10 MG tablet Take 10 mg by mouth daily.  02/01/13  Yes Historical Provider, MD  cloNIDine (CATAPRES) 0.2 MG tablet Take 0.2 mg by mouth at bedtime.    Yes Historical Provider, MD  Fluticasone-Salmeterol (ADVAIR) 250-50 MCG/DOSE AEPB Inhale 1 puff into the lungs every 12 (twelve) hours.   Yes Historical Provider, MD  lisinopril (PRINIVIL,ZESTRIL) 20 MG tablet Take 20 mg by mouth daily.   Yes Historical Provider, MD  OLANZapine zydis (ZYPREXA) 10 MG disintegrating tablet Take 10 mg by mouth at bedtime.  01/26/13  Yes Historical Provider, MD  omeprazole (PRILOSEC) 40 MG capsule Take 40 mg by mouth daily.  02/01/13  Yes Historical Provider, MD  oxycodone (ROXICODONE) 30 MG immediate release tablet Take 30 mg by mouth every 4 (four) hours. For pain.   Yes Historical Provider, MD  pravastatin (PRAVACHOL) 40 MG tablet Take 40 mg by mouth at bedtime.  12/21/12  Yes Historical Provider, MD  sitaGLIPtan-metformin (JANUMET) 50-500 MG per tablet Take 1 tablet by mouth 2 (two) times daily with a meal.   Yes Historical Provider, MD  terbinafine (LAMISIL) 250 MG tablet Take 250 mg by mouth daily.  02/01/13  Yes Historical Provider, MD  zolpidem (AMBIEN) 10 MG tablet Take 10 mg by mouth at bedtime as needed for sleep.   Yes Historical Provider, MD  clindamycin (CLEOCIN) 150 MG capsule Take 300 mg by mouth 2 (two) times daily.    Historical Provider, MD  oxyCODONE (ROXICODONE) 5 MG immediate release tablet Take 1 tablet (5 mg total) by mouth every 4 (four) hours as needed for moderate pain or severe pain. 05/30/13   Bryan SangJerrid Pippin, MD   Physical Exam: Filed Vitals:   09/02/13 1200  BP: 137/93  Pulse: 78  Temp: 98.2 F (36.8 C)  Resp: 20     BP 137/93  Pulse 78  Temp(Src) 98.2 F (36.8 C) (Oral)  Resp 20  Ht 6\' 2"  (1.88 m)  Wt 183 kg (403 lb 7.1 oz)  BMI 51.78 kg/m2  SpO2 97%  General:  Appears anxious; but not in acute distress; afebrile. Eyes: PERRL, normal lids, irises & conjunctiva; no nystagmus, no icterus ENT: grossly normal hearing, lips & tongue; no erythema or exudates; uvula midline. No drainage out of ears or nostrils Neck: no LAD, masses or thyromegaly Cardiovascular: RRR, no m/r/g. Trace edema bilaterally Respiratory: CTA bilaterally, no wheezing, no crackles. Abdomen: obese, no guarding; no rebound; diffuse pain in his abdomen (mainly epigastric region) Musculoskeletal: grossly normal tone BUE/BLE Psychiatric: normal speech; no SI or hallucinations. Anxious on exam Neurologic: CN intact; patient with complaints on difficulty seen (left peripheral field); also with numbness in his face and whole left side. MS 5/5 BL except for LLE which is 4/5          Labs on Admission:  Basic Metabolic Panel:  Recent Labs Lab 09/02/13 0839 09/02/13 0849  NA 138 139  K 4.2 4.1  CL 98 100  CO2 27  --   GLUCOSE 156* 153*  BUN 4* <3*  CREATININE 0.72 0.80  CALCIUM 9.2  --    Liver Function Tests:  Recent Labs Lab 09/02/13 0839  AST 21  ALT 20  ALKPHOS 53  BILITOT 0.4  PROT 7.5  ALBUMIN 3.4*    Recent Labs Lab 09/02/13 0839  LIPASE 15   CBC:  Recent Labs Lab 09/02/13 0839 09/02/13 0849  WBC 7.6  --   HGB 14.0 15.3  HCT 41.0 45.0  MCV 90.5  --   PLT 239  --    BNP (last 3 results)  Recent Labs  08/07/13 1330  PROBNP 148.9*   CBG: No results found for this basename: GLUCAP,  in the last 168 hours  Radiological Exams on Admission: Ct Head Wo Contrast  09/02/2013   CLINICAL DATA:  Left-sided weakness and numbness with history of diabetes and valvular heart disease  EXAM: CT HEAD WITHOUT CONTRAST  TECHNIQUE: Contiguous axial images were obtained from the base of the skull through  the vertex without intravenous contrast.  COMPARISON:  CT scan of the brain of August 06, 2013  FINDINGS: The ventricles are normal in size and position. There is no intracranial hemorrhage, mass effect, nor acute ischemic change. The cerebellum and brainstem are normal.  There is mucoperiosteal thickening in the right maxillary sinus. The remainder the visualized paranasal sinuses and mastoid air cells are clear. There is no skull fracture or lytic or blastic skull lesion.  IMPRESSION: There is no acute intracranial abnormality. There is chronic opacification of a portion of the right maxillary sinus.   Electronically Signed   By: David  [place]az[place]   On: 09/02/2013 09:27  Ct Angio Chest Aorta W/cm &/or Wo/cm  09/02/2013   CLINICAL DATA:  Right sided abdominal pain described as a tearing pain, radiating to the back. Left-sided weakness and numbness. Evaluate for possible dissection. Current history of diabetes, hypertension, asthma, and unspecified heart valve disorder.  EXAM: CT ANGIOGRAPHY CHEST, ABDOMEN AND PELVIS  TECHNIQUE: Multidetector CT imaging through the chest, abdomen and pelvis was performed using the standard protocol before and during bolus administration of intravenous contrast. Multiplanar reconstructed images and MIPs were obtained and reviewed to evaluate the vascular anatomy.  CONTRAST:  100mL OMNIPAQUE IOHEXOL 350 MG/ML IV.  COMPARISON:  No prior CT chest. Multiple CTs of the abdomen and pelvis dating back to 09/18/2008, most recently 04/26/2013.  FINDINGS: CTA CHEST FINDINGS  Abundant image noise due to body habitus. Unenhanced images demonstrate no mural hematoma in the thoracic aorta. No calcified plaque involving the thoracic aorta or proximal great vessels. No visible coronary calcification.  Enhanced images demonstrate no evidence of thoracic aortic aneurysm or dissection. No visible atherosclerotic plaque. Proximal great vessels widely patent.  Heart size normal. Pulmonary artery is not well  opacified, but no large central emboli. No pericardial effusion.  Approximate 8 mm circumscribed nodule in the posterior right lower lobe (series 7, image 69), stable dating back to 2011. Peripheral polygonal approximate 6 mm nodules in the anterior right lower lobe adjacent to the minor fissure (image 69) and in the anterior right upper lobe (image 51), also unchanged. No new or enlarging pulmonary nodules. Lungs clear without localized airspace consolidation or interstitial disease. Central airways patent with mild bronchial wall thickening. No pleural effusions.  Scattered normal size mediastinal, hilar, and axillary lymph nodes. No significant lymphadenopathy. Visualized thyroid gland unremarkable.  Bone window images unremarkable.  Review of the MIP images confirms the above findings.  CTA ABDOMEN AND PELVIS FINDINGS  Abundant image noise due to body habitus. No evidence of dissection of the abdominal aorta or the iliofemoral arteries. Solitary calcified plaque involving the left common femoral artery. No visible atherosclerotic plaque elsewhere. Single right renal artery and 2 left renal arteries, widely patent. Widely patent celiac artery, SMA and IMA.  Diffuse hepatic steatosis without focal hepatic parenchymal abnormality. Normal appearing spleen, pancreas, right adrenal gland, and kidneys. Approximate 1.6 x 1.5 x 1.8 cm low-attenuation nodule involving the left adrenal gland. Gallbladder unremarkable by CT. No biliary ductal dilation. No significant lymphadenopathy.  Normal appearing stomach and small bowel. Moderate stool burden in the rectum and sigmoid colon. Entire colon normal in appearance. Lipoma involving the ileocecal valve. Normal appearing decompressed appendix in the right upper pelvis. No ascites.  Urinary bladder decompressed and unremarkable. Prostate gland and seminal vesicles normal for age. Small left inguinal hernia containing fat.  Bone window images unremarkable.  Review of the MIP  images confirms the above findings.  IMPRESSION: 1. No evidence of thoracic or abdominal aortic aneurysm or dissection. 2. Minimal atherosclerosis involving the left common femoral artery. No visible atherosclerotic disease elsewhere. 3. Stable right lung nodules dating back to 2011, consistent with benign granulomas and likely subpleural lymph nodes. 4.  No acute cardiopulmonary disease. 5. Hepatic steatosis. 6. No acute abnormalities involving the abdomen or pelvis. 7. Small left inguinal hernia containing fat. 8. Approximate 1.8 cm left adrenal nodule, statistically a benign adenoma.   Electronically Signed   By: Hulan Saashomas  Lawrence M.D.   On: 09/02/2013 09:46   Ct Cta Abd/pel W/cm &/or W/o Cm  09/02/2013   CLINICAL DATA:  Right sided abdominal  pain described as a tearing pain, radiating to the back. Left-sided weakness and numbness. Evaluate for possible dissection. Current history of diabetes, hypertension, asthma, and unspecified heart valve disorder.  EXAM: CT ANGIOGRAPHY CHEST, ABDOMEN AND PELVIS  TECHNIQUE: Multidetector CT imaging through the chest, abdomen and pelvis was performed using the standard protocol before and during bolus administration of intravenous contrast. Multiplanar reconstructed images and MIPs were obtained and reviewed to evaluate the vascular anatomy.  CONTRAST:  OMNIPAQUE IOHEXOL 350 MG/ML IV.  COMPARISON:  No prior CT chest. Multiple CTs of the abdomen and pelvis dating back to 09/18/2008, most recently 04/26/2013.  FINDINGS: CTA CHEST FINDINGS  Abundant image noise due to body habitus. Unenhanced images demonstrate no mural hematoma in the thoracic aorta. No calcified plaque involving the thoracic aorta or proximal great vessels. No visible coronary calcification.  Enhanced images demonstrate no evidence of thoracic aortic aneurysm or dissection. No visible atherosclerotic plaque. Proximal great vessels widely patent.  Heart size normal. Pulmonary artery is not well opacified,  but no large central emboli. No pericardial effusion.  Approximate 8 mm circumscribed nodule in the posterior right lower lobe (series 7, image 69), stable dating back to 2011. Peripheral polygonal approximate 6 mm nodules in the anterior right lower lobe adjacent to the minor fissure (image 69) and in the anterior right upper lobe (image 51), also unchanged. No new or enlarging pulmonary nodules. Lungs clear without localized airspace consolidation or interstitial disease. Central airways patent with mild bronchial wall thickening. No pleural effusions.  Scattered normal size mediastinal, hilar, and axillary lymph nodes. No significant lymphadenopathy. Visualized thyroid gland unremarkable.  Bone window images unremarkable.  Review of the MIP images confirms the above findings.  CTA ABDOMEN AND PELVIS FINDINGS  Abundant image noise due to body habitus. No evidence of dissection of the abdominal aorta or the iliofemoral arteries. Solitary calcified plaque involving the left common femoral artery. No visible atherosclerotic plaque elsewhere. Single right renal artery and 2 left renal arteries, widely patent. Widely patent celiac artery, SMA and IMA.  Diffuse hepatic steatosis without focal hepatic parenchymal abnormality. Normal appearing spleen, pancreas, right adrenal gland, and kidneys. Approximate 1.6 x 1.5 x 1.8 cm low-attenuation nodule involving the left adrenal gland. Gallbladder unremarkable by CT. No biliary ductal dilation. No significant lymphadenopathy.  Normal appearing stomach and small bowel. Moderate stool burden in the rectum and sigmoid colon. Entire colon normal in appearance. Lipoma involving the ileocecal valve. Normal appearing decompressed appendix in the right upper pelvis. No ascites.  Urinary bladder decompressed and unremarkable. Prostate gland and seminal vesicles normal for age. Small left inguinal hernia containing fat.  Bone window images unremarkable.  Review of the MIP images  confirms the above findings.  IMPRESSION: 1. No evidence of thoracic or abdominal aortic aneurysm or dissection. 2. Minimal atherosclerosis involving the left common femoral artery. No visible atherosclerotic disease elsewhere. 3. Stable right lung nodules dating back to 2011, consistent with benign granulomas and likely subpleural lymph nodes. 4.  No acute cardiopulmonary disease. 5. Hepatic steatosis. 6. No acute abnormalities involving the abdomen or pelvis. 7. Small left inguinal hernia containing fat. 8. Approximate 1.8 cm left adrenal nodule, statistically a benign adenoma.   Electronically Signed   By: Hulan Saas M.D.   On: 09/02/2013 09:46    EKG:  Ventricular Rate: 83  PR Interval: 66  QRS Duration: 95  QT Interval: 382  QTC Calculation: 449  R Axis: 93  Text Interpretation: Sinus rhythm Short PR interval  Borderline right axis  deviation Baseline wander in lead(s) V5 No ischemic findings   Assessment/Plan 1-Hemiplegia, unspecified, affecting nondominant side: TIA vs stroke. -CT head negative. -neurology on board, will follow recommendations -patient is to big for closed MRI inside the hospital; will need open MRI as an outpatient -will complete stroke work up initiated almost a month ago; only 2-D echo pending -will ask PT/OT evaluation -continue ASA for secondary prevention (he was not taking it at home) and statins -continue modifying risk factors (controlling BP and DM)  2-Diabetes mellitus: last A1C 6.1 -will continue jnumet -also SSI while inpatient  3-HLD (hyperlipidemia): LDL 119 -patient was not compliant with statins -will restart statins  4-OSA (obstructive sleep apnea): CPAP automode while inpatient -needs sleep study at discharge  5-Morbidly obese: low calorie diet and exercise has been discussed with patient  6-Depression with anxiety and insomnia: will continue celexa, PRN xanax and PRN ambien  7-Nonorganic psychosis: continue  olanzapine  8-Chronic pain: affecting abdomen and back -continue home PO narcotic regimen -avoid escalating dose in pain regimen or using IV narcotics -CT chest and abd/pelvis done on admission; no acute abnormality to explained chronic excruciating pain as described by patient.  9-GERD: continue PPI (will adjust to BID)   DVT: heparin  Dr. Thad Ranger (neurology)  Code Status: Full Family Communication: no family at bedside Disposition Plan: LOS < 2 midnights, telemetry; observation  Time spent: 50 minutes  Vassie Loll [facility] Pager [phone]  **Disclaimer: This note may have been dictated with voice recognition software. Similar sounding words can inadvertently be transcribed and this note may contain transcription errors which may not have been corrected upon publication of note.**

## 2013-09-02 NOTE — ED Provider Notes (Addendum)
CSN: [id number]     Arrival date & time 09/02/13  0814 History   First MD Initiated Contact with Patient 09/02/13 [phone]     Chief Complaint  Patient presents with  . Abdominal Pain  . Numbness     (Consider location/radiation/quality/duration/timing/severity/associated sxs/prior Treatment) HPI Comments: 38 year old male with diabetes, obesity, sleep apnea, left-sided weakness, smoker presents with worsening left-sided weakness and right-sided severe tearing abdominal pain since he woke up this morning. Patient felt that his baseline yesterday with mild left-sided numbness however he feels the weakness is new since he woke up before and today. Patient denies any headache or new vision changes, mild dizzy/lightheadedness. No history of abdominal surgeries or history of similar pain. Pain is along the right side of the abdomen, patient denies any kidney stone or gallbladder history. Minimal alcohol use and chronic smoker. Nothing improves the pain constant. Patient had blood pressure drop below 100 systolic and then it resumed to 130s for EMS. Patient denies active bleeding.  Patient is a 38 y.o. male presenting with abdominal pain. The history is provided by the patient.  Abdominal Pain Associated symptoms: nausea and vomiting   Associated symptoms: no chest pain, no chills, no dysuria, no fever and no shortness of breath     Past Medical History  Diagnosis Date  . Heart valve disorder   . Hypertension   . Asthma   . Diabetes mellitus   . Hyperlipidemia   . Anaphylactic reaction   . Bipolar disorder   . Adult ADHD   . Morbidly obese    Past Surgical History  Procedure Laterality Date  . Carpal tunnel release    . Nose surgery     Family History  Problem Relation Age of Onset  . CAD Mother   . Diabetes Mellitus II Mother   . Stroke Mother    History  Substance Use Topics  . Smoking status: Current Every Day Smoker -- 1.00 packs/day for 23 years    Types: Cigarettes  .  Smokeless tobacco: Never Used  . Alcohol Use: No    Review of Systems  Constitutional: Negative for fever and chills.  HENT: Negative for congestion.   Eyes: Negative for visual disturbance.  Respiratory: Negative for shortness of breath.   Cardiovascular: Negative for chest pain.  Gastrointestinal: Positive for nausea, vomiting and abdominal pain.  Genitourinary: Negative for dysuria and flank pain.  Musculoskeletal: Negative for back pain, neck pain and neck stiffness.  Skin: Negative for rash.  Neurological: Positive for weakness, light-headedness and numbness. Negative for headaches.      Allergies  Atenolol; Other; and Tylenol  Home Medications   Prior to Admission medications   Medication Sig Start Date End Date Taking? Authorizing Provider  ALPRAZolam Prudy Feeler) 1 MG tablet Take 1 mg by mouth 5 (five) times daily as needed for anxiety.     Historical Provider, MD  amphetamine-dextroamphetamine (ADDERALL) 20 MG tablet Take 40 mg by mouth daily as needed (for additional focus).     Historical Provider, MD  citalopram (CELEXA) 10 MG tablet Take 10 mg by mouth daily.  02/01/13   Historical Provider, MD  cloNIDine (CATAPRES) 0.2 MG tablet Take 0.2 mg by mouth at bedtime.     Historical Provider, MD  Fluticasone-Salmeterol (ADVAIR) 250-50 MCG/DOSE AEPB Inhale 1 puff into the lungs every 12 (twelve) hours.    Historical Provider, MD  lisinopril (PRINIVIL,ZESTRIL) 20 MG tablet Take 20 mg by mouth daily.    Historical Provider, MD  OLANZapine zydis (  ZYPREXA) 10 MG disintegrating tablet Take 10 mg by mouth at bedtime.  01/26/13   Historical Provider, MD  omeprazole (PRILOSEC) 40 MG capsule Take 40 mg by mouth daily.  02/01/13   Historical Provider, MD  oxycodone (ROXICODONE) 30 MG immediate release tablet Take 30 mg by mouth every 4 (four) hours. For pain.    Historical Provider, MD  oxyCODONE (ROXICODONE) 5 MG immediate release tablet Take 1 tablet (5 mg total) by mouth every 4 (four)  hours as needed for moderate pain or severe pain. 05/30/13   Toney SangJerrid Pippin, MD  pravastatin (PRAVACHOL) 40 MG tablet Take 40 mg by mouth at bedtime.  12/21/12   Historical Provider, MD  sitaGLIPtan-metformin (JANUMET) 50-500 MG per tablet Take 1 tablet by mouth 2 (two) times daily with a meal.    Historical Provider, MD  terbinafine (LAMISIL) 250 MG tablet Take 250 mg by mouth daily.  02/01/13   Historical Provider, MD  zolpidem (AMBIEN) 10 MG tablet Take 10 mg by mouth at bedtime as needed for sleep.    Historical Provider, MD   BP 149/80  Pulse 85  Temp(Src) 98.7 F (37.1 C) (Oral)  Resp 14  SpO2 99% Physical Exam  Nursing note and vitals reviewed. Constitutional: He is oriented to person, place, and time. He appears well-developed and well-nourished. He appears distressed (severe discomfort).  HENT:  Head: Normocephalic and atraumatic.  Eyes: Conjunctivae are normal. Right eye exhibits no discharge. Left eye exhibits no discharge. No scleral icterus.  Neck: Normal range of motion. Neck supple. No tracheal deviation present.  Cardiovascular: Normal rate, regular rhythm and intact distal pulses.   Pulmonary/Chest: Effort normal and breath sounds normal.  Abdominal: Soft. He exhibits no distension. There is tenderness (obese, moderate to severe right-sided abdominal tenderness). There is no guarding.  Musculoskeletal: He exhibits edema (mild lower tremors bilateral).  Neurological: He is alert and oriented to person, place, and time. GCS eye subscore is 4. GCS verbal subscore is 5. GCS motor subscore is 6.  Patient has 4-5 weakness on the left upper and lower extremity versus 5 out of 5 in the right upper and lower extremity. Sensation intact in the right upper and lower extremity however minimal sensation in the left upper and lower extremity. Eczema the muscle function intact, pupils equal bilateral, mild ptosis on the right finger nose  Skin: Skin is warm. Rash (chronic skin changes with  multiple macular/scars) noted.  Psychiatric: He has a normal mood and affect.    ED Course  Procedures (including critical care time) Emergency Ultrasound Study:   Angiocath insertion Performed by: Enid SkeensZAVITZ, Annaleise Burger M  Consent: Verbal consent obtained. Risks and benefits: risks, benefits and alternatives were discussed Immediately prior to procedure the correct patient, procedure, equipment, support staff and site/side marked as needed.  Indication: difficult IV access Preparation: Patient was prepped and draped in the usual sterile fashion. Vein Location: Left basilic vein was visualized during assessment for potential access sites and was found to be patent/ easily compressed with linear ultrasound.  The needle was visualized with real-time ultrasound and guided into the vein. Gauge: 20  Image saved and stored.  Normal blood return.  Patient tolerance: Patient tolerated the procedure well with no immediate complications.    Emergency Ultrasound Study:   Angiocath insertion Performed by: Enid SkeensZAVITZ, Tamora Huneke M  Consent: Verbal consent obtained. Risks and benefits: risks, benefits and alternatives were discussed Immediately prior to procedure the correct patient, procedure, equipment, support staff and site/side marked as needed.  Indication: difficult IV access Preparation: Patient was prepped and draped in the usual sterile fashion. Vein Location: Right basilic vein was visualized during assessment for potential access sites and was found to be patent/ easily compressed with linear ultrasound.  The needle was visualized with real-time ultrasound and guided into the vein. Gauge: 20  Image saved and stored.  Normal blood return.  Patient tolerance: Patient tolerated the procedure well with no immediate complications.   Ultrasound limited abdominal and limited transthoracic ultrasound (FAST)  Indication: acute abd pain Difficult exam to secondary to obesity, bowel gas artifact and  pain. Able to obtain right upper quadrant view with no signs of significant fluid in the abdomen. Images archived electronically Dr. Jodi Mourning personally performed and interpreted the images   Labs Review Labs Reviewed  COMPREHENSIVE METABOLIC PANEL - Abnormal; Notable for the following:    Glucose, Bld 156 (*)    BUN 4 (*)    Albumin 3.4 (*)    All other components within normal limits  URINALYSIS, ROUTINE W REFLEX MICROSCOPIC - Abnormal; Notable for the following:    pH 8.5 (*)    All other components within normal limits  SALICYLATE LEVEL - Abnormal; Notable for the following:    Salicylate Lvl <2.0 (*)    All other components within normal limits  I-STAT CHEM 8, ED - Abnormal; Notable for the following:    BUN <3 (*)    Glucose, Bld 153 (*)    All other components within normal limits  CLOSTRIDIUM DIFFICILE BY PCR  CBC  PROTIME-INR  LIPASE, BLOOD  ACETAMINOPHEN LEVEL  ETHANOL  I-STAT CG4 LACTIC ACID, ED  I-STAT TROPOININ, ED    Imaging Review Ct Head Wo Contrast  09/02/2013   CLINICAL DATA:  Left-sided weakness and numbness with history of diabetes and valvular heart disease  EXAM: CT HEAD WITHOUT CONTRAST  TECHNIQUE: Contiguous axial images were obtained from the base of the skull through the vertex without intravenous contrast.  COMPARISON:  CT scan of the brain of August 06, 2013  FINDINGS: The ventricles are normal in size and position. There is no intracranial hemorrhage, mass effect, nor acute ischemic change. The cerebellum and brainstem are normal.  There is mucoperiosteal thickening in the right maxillary sinus. The remainder the visualized paranasal sinuses and mastoid air cells are clear. There is no skull fracture or lytic or blastic skull lesion.  IMPRESSION: There is no acute intracranial abnormality. There is chronic opacification of a portion of the right maxillary sinus.   Electronically Signed   By: David  [place]   On: 09/02/2013 09:27   Ct Angio Chest Aorta W/cm  &/or Wo/cm  09/02/2013   CLINICAL DATA:  Right sided abdominal pain described as a tearing pain, radiating to the back. Left-sided weakness and numbness. Evaluate for possible dissection. Current history of diabetes, hypertension, asthma, and unspecified heart valve disorder.  EXAM: CT ANGIOGRAPHY CHEST, ABDOMEN AND PELVIS  TECHNIQUE: Multidetector CT imaging through the chest, abdomen and pelvis was performed using the standard protocol before and during bolus administration of intravenous contrast. Multiplanar reconstructed images and MIPs were obtained and reviewed to evaluate the vascular anatomy.  CONTRAST:  OMNIPAQUE IOHEXOL 350 MG/ML IV.  COMPARISON:  No prior CT chest. Multiple CTs of the abdomen and pelvis dating back to 09/18/2008, most recently 04/26/2013.  FINDINGS: CTA CHEST FINDINGS  Abundant image noise due to body habitus. Unenhanced images demonstrate no mural hematoma in the thoracic aorta. No calcified plaque involving the thoracic  aorta or proximal great vessels. No visible coronary calcification.  Enhanced images demonstrate no evidence of thoracic aortic aneurysm or dissection. No visible atherosclerotic plaque. Proximal great vessels widely patent.  Heart size normal. Pulmonary artery is not well opacified, but no large central emboli. No pericardial effusion.  Approximate 8 mm circumscribed nodule in the posterior right lower lobe (series 7, image 69), stable dating back to 2011. Peripheral polygonal approximate 6 mm nodules in the anterior right lower lobe adjacent to the minor fissure (image 69) and in the anterior right upper lobe (image 51), also unchanged. No new or enlarging pulmonary nodules. Lungs clear without localized airspace consolidation or interstitial disease. Central airways patent with mild bronchial wall thickening. No pleural effusions.  Scattered normal size mediastinal, hilar, and axillary lymph nodes. No significant lymphadenopathy. Visualized thyroid gland  unremarkable.  Bone window images unremarkable.  Review of the MIP images confirms the above findings.  CTA ABDOMEN AND PELVIS FINDINGS  Abundant image noise due to body habitus. No evidence of dissection of the abdominal aorta or the iliofemoral arteries. Solitary calcified plaque involving the left common femoral artery. No visible atherosclerotic plaque elsewhere. Single right renal artery and 2 left renal arteries, widely patent. Widely patent celiac artery, SMA and IMA.  Diffuse hepatic steatosis without focal hepatic parenchymal abnormality. Normal appearing spleen, pancreas, right adrenal gland, and kidneys. Approximate 1.6 x 1.5 x 1.8 cm low-attenuation nodule involving the left adrenal gland. Gallbladder unremarkable by CT. No biliary ductal dilation. No significant lymphadenopathy.  Normal appearing stomach and small bowel. Moderate stool burden in the rectum and sigmoid colon. Entire colon normal in appearance. Lipoma involving the ileocecal valve. Normal appearing decompressed appendix in the right upper pelvis. No ascites.  Urinary bladder decompressed and unremarkable. Prostate gland and seminal vesicles normal for age. Small left inguinal hernia containing fat.  Bone window images unremarkable.  Review of the MIP images confirms the above findings.  IMPRESSION: 1. No evidence of thoracic or abdominal aortic aneurysm or dissection. 2. Minimal atherosclerosis involving the left common femoral artery. No visible atherosclerotic disease elsewhere. 3. Stable right lung nodules dating back to 2011, consistent with benign granulomas and likely subpleural lymph nodes. 4.  No acute cardiopulmonary disease. 5. Hepatic steatosis. 6. No acute abnormalities involving the abdomen or pelvis. 7. Small left inguinal hernia containing fat. 8. Approximate 1.8 cm left adrenal nodule, statistically a benign adenoma.   Electronically Signed   By: Hulan Saas M.D.   On: 09/02/2013 09:46   Ct Cta Abd/pel W/cm &/or W/o  Cm  09/02/2013   CLINICAL DATA:  Right sided abdominal pain described as a tearing pain, radiating to the back. Left-sided weakness and numbness. Evaluate for possible dissection. Current history of diabetes, hypertension, asthma, and unspecified heart valve disorder.  EXAM: CT ANGIOGRAPHY CHEST, ABDOMEN AND PELVIS  TECHNIQUE: Multidetector CT imaging through the chest, abdomen and pelvis was performed using the standard protocol before and during bolus administration of intravenous contrast. Multiplanar reconstructed images and MIPs were obtained and reviewed to evaluate the vascular anatomy.  CONTRAST:  OMNIPAQUE IOHEXOL 350 MG/ML IV.  COMPARISON:  No prior CT chest. Multiple CTs of the abdomen and pelvis dating back to 09/18/2008, most recently 04/26/2013.  FINDINGS: CTA CHEST FINDINGS  Abundant image noise due to body habitus. Unenhanced images demonstrate no mural hematoma in the thoracic aorta. No calcified plaque involving the thoracic aorta or proximal great vessels. No visible coronary calcification.  Enhanced images demonstrate no evidence of thoracic  aortic aneurysm or dissection. No visible atherosclerotic plaque. Proximal great vessels widely patent.  Heart size normal. Pulmonary artery is not well opacified, but no large central emboli. No pericardial effusion.  Approximate 8 mm circumscribed nodule in the posterior right lower lobe (series 7, image 69), stable dating back to 2011. Peripheral polygonal approximate 6 mm nodules in the anterior right lower lobe adjacent to the minor fissure (image 69) and in the anterior right upper lobe (image 51), also unchanged. No new or enlarging pulmonary nodules. Lungs clear without localized airspace consolidation or interstitial disease. Central airways patent with mild bronchial wall thickening. No pleural effusions.  Scattered normal size mediastinal, hilar, and axillary lymph nodes. No significant lymphadenopathy. Visualized thyroid gland unremarkable.   Bone window images unremarkable.  Review of the MIP images confirms the above findings.  CTA ABDOMEN AND PELVIS FINDINGS  Abundant image noise due to body habitus. No evidence of dissection of the abdominal aorta or the iliofemoral arteries. Solitary calcified plaque involving the left common femoral artery. No visible atherosclerotic plaque elsewhere. Single right renal artery and 2 left renal arteries, widely patent. Widely patent celiac artery, SMA and IMA.  Diffuse hepatic steatosis without focal hepatic parenchymal abnormality. Normal appearing spleen, pancreas, right adrenal gland, and kidneys. Approximate 1.6 x 1.5 x 1.8 cm low-attenuation nodule involving the left adrenal gland. Gallbladder unremarkable by CT. No biliary ductal dilation. No significant lymphadenopathy.  Normal appearing stomach and small bowel. Moderate stool burden in the rectum and sigmoid colon. Entire colon normal in appearance. Lipoma involving the ileocecal valve. Normal appearing decompressed appendix in the right upper pelvis. No ascites.  Urinary bladder decompressed and unremarkable. Prostate gland and seminal vesicles normal for age. Small left inguinal hernia containing fat.  Bone window images unremarkable.  Review of the MIP images confirms the above findings.  IMPRESSION: 1. No evidence of thoracic or abdominal aortic aneurysm or dissection. 2. Minimal atherosclerosis involving the left common femoral artery. No visible atherosclerotic disease elsewhere. 3. Stable right lung nodules dating back to 2011, consistent with benign granulomas and likely subpleural lymph nodes. 4.  No acute cardiopulmonary disease. 5. Hepatic steatosis. 6. No acute abnormalities involving the abdomen or pelvis. 7. Small left inguinal hernia containing fat. 8. Approximate 1.8 cm left adrenal nodule, statistically a benign adenoma.   Electronically Signed   By: Hulan Saashomas  Lawrence M.D.   On: 09/02/2013 09:46     EKG Interpretation   Date/Time:   Sunday September 02 2013 08:23:06 EDT Ventricular Rate:  83 PR Interval:  66 QRS Duration: 95 QT Interval:  382 QTC Calculation: 449 R Axis:   93 Text Interpretation:  Sinus rhythm Short PR interval Borderline right axis  deviation Baseline wander in lead(s) V5 No ischemic findings Confirmed by  Jonne Rote  MD, Kalib Bhagat ([id number]) on 09/02/2013 9:11:46 AM      MDM   Final diagnoses:  Hemiplegia, unspecified, affecting nondominant side  Abd pain right sided Hyperglycemia  Patient presented with 2 primary complaints. Left-sided weakness new per patient report some time during sleep, out of TPA window, reviewed recent AGAINST MEDICAL ADVICE. Patient does have 4-5 strength in the left upper lower extremity. Neurology evaluated in ER and recommended observation. Patient to obese for inpatient MRI. Plan for aspirin and I spoke with [facility] hospitalist for observation after I spoke with unassigned who said [facility] would have to admit since patient was recently admitted one month prior.  Right-sided abdominal pain concern for kidney stone versus less likely dissection. CT  angina ordered no acute findings. Pain improved on recheck.  The patients results and plan were reviewed and discussed.   Any x-rays performed were personally reviewed by myself.   Differential diagnosis were considered with the presenting HPI.  Medications  0.9 %  sodium chloride infusion (not administered)  ondansetron (ZOFRAN) injection 4 mg (not administered)  fentaNYL (SUBLIMAZE) 0.05 MG/ML injection (100 mcg  Given 09/02/13 0841)  sodium chloride 0.9 % bolus 500 mL (0 mLs Intravenous Stopped 09/02/13 1046)  iohexol (OMNIPAQUE) 350 MG/ML injection 100 mL (100 mLs Intravenous Contrast Given 09/02/13 0904)  HYDROmorphone (DILAUDID) injection 1 mg (1 mg Intravenous Given 09/02/13 0955)     Filed Vitals:   09/02/13 1610 09/02/13 0841 09/02/13 0845 09/02/13 0945  BP: 149/80 156/87 147/89 141/71  Pulse: 85 87 83 80  Temp:      TempSrc:       Resp: 14 16 16 15   SpO2: 99% 100% 99% 98%    Admission/ observation were discussed with the admitting physician, patient and/or family and they are comfortable with the plan.    Enid Skeens, MD 09/02/13 1054  Enid Skeens, MD 09/02/13 [phone]

## 2013-09-03 ENCOUNTER — Observation Stay (HOSPITAL_COMMUNITY): Payer: Medicare Other

## 2013-09-03 DIAGNOSIS — M6281 Muscle weakness (generalized): Secondary | ICD-10-CM

## 2013-09-03 DIAGNOSIS — G459 Transient cerebral ischemic attack, unspecified: Secondary | ICD-10-CM

## 2013-09-03 DIAGNOSIS — E139 Other specified diabetes mellitus without complications: Secondary | ICD-10-CM

## 2013-09-03 LAB — GLUCOSE, CAPILLARY
GLUCOSE-CAPILLARY: 188 mg/dL — AB (ref 70–99)
Glucose-Capillary: 142 mg/dL — ABNORMAL HIGH (ref 70–99)
Glucose-Capillary: 145 mg/dL — ABNORMAL HIGH (ref 70–99)
Glucose-Capillary: 136 mg/dL — ABNORMAL HIGH (ref 70–99)

## 2013-09-03 MED ORDER — SENNOSIDES-DOCUSATE SODIUM 8.6-50 MG PO TABS: 1.0000 | ORAL_TABLET | Freq: Two times a day (BID) | ORAL | Status: DC | PRN

## 2013-09-03 MED ORDER — OXYCODONE HCL 5 MG PO TABS
30.0000 mg | ORAL_TABLET | ORAL | Status: DC | PRN
  Administered 2013-09-03 – 2013-09-04 (×7): 30 mg via ORAL
  Filled 2013-09-03 (×7): qty 6

## 2013-09-03 MED ORDER — NICOTINE 21 MG/24HR TD PT24
21.0000 mg | MEDICATED_PATCH | Freq: Every morning | TRANSDERMAL | Status: DC
  Administered 2013-09-03 (×2): 21 mg via TRANSDERMAL
  Filled 2013-09-03: qty 1

## 2013-09-03 MED ORDER — PERFLUTREN LIPID MICROSPHERE
INTRAVENOUS | Status: AC
  Filled 2013-09-03: qty 10

## 2013-09-03 MED ORDER — OXYCODONE HCL 5 MG PO TABS: 30.0000 mg | ORAL_TABLET | ORAL | Status: DC | PRN

## 2013-09-03 NOTE — Evaluation (Signed)
Occupational Therapy Evaluation Patient Details Name: Bryan Gallegos MRN: [id number] DOB: 05/12/1975 Today's Date: 09/03/2013    History of Present Illness This 38 y.o. male admitted with complaint of Lt. sided numbness and weakness.  CT of head negative for acute abnormality.  MRI unable to be performed due to body habitus.  PMH includes:  HTN; DM; Asthma; Bipoloar disorder; schizophrenia   Clinical Impression   Pt admitted with above. He demonstrates the below listed deficits and will benefit from continued OT to maximize safety and independence with BADLs.  Pt presents to OT with mild Lt. UE weakness (did not appear to put forth full effort during testing).   Pt provided with theraband strengthening HEP.  He appears to be at baseline otherwise.  Will sign off.       Follow Up Recommendations  No OT follow up    Equipment Recommendations  None recommended by OT    Recommendations for Other Services       Precautions / Restrictions        Mobility Bed Mobility Overal bed mobility: Modified Independent                Transfers Overall transfer level: Modified independent                    Balance Overall balance assessment: No apparent balance deficits (not formally assessed)                                          ADL Overall ADL's : Modified independent                                       General ADL Comments: Pt performed LB ADLs mod I.  Pt ambulated to BR independently.  Tub transfer independently      Vision                     Perception Perception Perception Tested?: Yes   Praxis Praxis Praxis tested?: Within functional limits    Pertinent Vitals/Pain 10/10 chronic back pain.  RN notified.      Hand Dominance Right   Extremity/Trunk Assessment Upper Extremity Assessment Upper Extremity Assessment: LUE deficits/detail LUE Deficits / Details: strength grossly 4/5, but with decreased  effort with testing and inconsitent responses LUE Sensation: decreased light touch (Pt reports tingling of fingertips digits 2-4)   Lower Extremity Assessment Lower Extremity Assessment: Overall WFL for tasks assessed   Cervical / Trunk Assessment Cervical / Trunk Assessment: Normal   Communication Communication Communication: No difficulties   Cognition Arousal/Alertness: Awake/alert Behavior During Therapy: WFL for tasks assessed/performed Overall Cognitive Status: Within Functional Limits for tasks assessed                     General Comments       Exercises Exercises: Other exercises Other Exercises Other Exercises: shoulder abduction blue t-band x 20 Other Exercises: shoulder flexion with blue t-band x 20 Other Exercises: elbow flexion blue theraband x 20   Shoulder Instructions      Home Living Family/patient expects to be discharged to:: Private residence Living Arrangements: Spouse/significant other Available Help at Discharge: Family;Available PRN/intermittently Type of Home: House Home Access: Stairs to enter Entergy CorporationEntrance Stairs-Number of Steps: 1 Entrance Stairs-Rails:  None Home Layout: One level     Bathroom Shower/Tub: Tub/shower unit;Curtain Shower/tub characteristics: Engineer, building servicesCurtain Bathroom Toilet: Standard     Home Equipment: None          Prior Functioning/Environment Level of Independence: Independent        Comments: Pt is on disability.    OT Diagnosis:     OT Problem List:     OT Treatment/Interventions:      OT Goals(Current goals can be found in the care plan section)    OT Frequency:     Barriers to D/C:            Co-evaluation              End of Session Nurse Communication: Patient requests pain meds  Activity Tolerance: Patient tolerated treatment well Patient left: in bed;with call bell/phone within reach;with family/visitor present;with nursing/sitter in room   Time: 1540-1557 OT Time Calculation (min):  17 min Charges:  OT General Charges $OT Visit: 1 Procedure OT Evaluation $Initial OT Evaluation Tier I: 1 Procedure OT Treatments $Therapeutic Exercise: 8-22 mins G-Codes: OT G-codes **NOT FOR INPATIENT CLASS** Functional Limitation: Self care Self Care Current Status (Z6109(G8987): 0 percent impaired, limited or restricted Self Care Goal Status (U0454(G8988): 0 percent impaired, limited or restricted Self Care Discharge Status (U9811(G8989): 0 percent impaired, limited or restricted  Bryan Gallegos 09/03/2013, 4:22 PM

## 2013-09-03 NOTE — Progress Notes (Addendum)
  Echocardiogram 2D Echocardiogram with Definity has been performed.  Bryan Gallegos 09/03/2013, 3:13 PM

## 2013-09-03 NOTE — Progress Notes (Signed)
Subjective: Patient reports some improvement in his numbness although it continues.  No new complaints noted.    Objective: Current vital signs: BP 162/87  Pulse 78  Temp(Src) 98.3 F (36.8 C) (Oral)  Resp 18  Ht 6\' 2"  (1.88 m)  Wt 183 kg (403 lb 7.1 oz)  BMI 51.78 kg/m2  SpO2 96% Vital signs in last 24 hours: Temp:  [97.3 F (36.3 C)-99 F (37.2 C)] 98.3 F (36.8 C) (07/06 1009) Pulse Rate:  [74-91] 78 (07/06 1009) Resp:  [16-20] 18 (07/06 1009) BP: (124-162)/(60-87) 162/87 mmHg (07/06 1009) SpO2:  [96 %-100 %] 96 % (07/06 1009)  Intake/Output from previous day:   Intake/Output this shift: Total I/O In: 840 [P.O.:840] Out: -  Nutritional status:    Neurologic Exam: Mental Status:  Alert, oriented, thought content appropriate. Speech fluent without evidence of aphasia. Able to follow 3 step commands without difficulty.  Cranial Nerves:  II: Discs flat bilaterally; Decreased peripheral vision on the left, pupils equal, round, reactive to light and accommodation  III,IV, VI: ptosis not present, extra-ocular motions intact bilaterally  V,VII: smile symmetric, facial light touch sensation decreased on the left  VIII: hearing normal bilaterally  IX,X: gag reflex present  XI: bilateral shoulder shrug  XII: midline tongue extension  Motor:  Right : Upper extremity 5/5        Left: Upper extremity 5-/5   Lower extremity 5/5     Lower extremity 5-/5  Tone and bulk:normal tone throughout; no atrophy noted  Sensory: Pinprick and light touch decreased on the left  Deep Tendon Reflexes: 2+ and symmetric throughout  Plantars:  Right: downgoing    Left: downgoing  Cerebellar:  normal finger-to-nose testing   Lab Results: Basic Metabolic Panel:  Recent Labs Lab 09/02/13 0839 09/02/13 0849  NA 138 139  K 4.2 4.1  CL 98 100  CO2 27  --   GLUCOSE 156* 153*  BUN 4* <3*  CREATININE 0.72 0.80  CALCIUM 9.2  --     Liver Function Tests:  Recent Labs Lab  09/02/13 0839  AST 21  ALT 20  ALKPHOS 53  BILITOT 0.4  PROT 7.5  ALBUMIN 3.4*    Recent Labs Lab 09/02/13 0839  LIPASE 15   No results found for this basename: AMMONIA,  in the last 168 hours  CBC:  Recent Labs Lab 09/02/13 0839 09/02/13 0849  WBC 7.6  --   HGB 14.0 15.3  HCT 41.0 45.0  MCV 90.5  --   PLT 239  --     Cardiac Enzymes: No results found for this basename: CKTOTAL, CKMB, CKMBINDEX, TROPONINI,  in the last 168 hours  Lipid Panel: No results found for this basename: CHOL, TRIG, HDL, CHOLHDL, VLDL, LDLCALC,  in the last 168 hours  CBG:  Recent Labs Lab 09/02/13 1611 09/02/13 2125 09/03/13 0700 09/03/13 1221  GLUCAP 126* 111* 142* 145*    Microbiology: Results for orders placed during the hospital encounter of 09/02/13  CLOSTRIDIUM DIFFICILE BY PCR     Status: None   Collection Time    09/02/13  9:46 AM      Result Value Ref Range Status   C difficile by pcr NEGATIVE  NEGATIVE Final    Coagulation Studies:  Recent Labs  09/02/13 0839  LABPROT 13.3  INR 1.01    Imaging: Ct Head Wo Contrast  09/02/2013   CLINICAL DATA:  Left-sided weakness and numbness with history of diabetes and valvular heart disease  EXAM: CT HEAD WITHOUT CONTRAST  TECHNIQUE: Contiguous axial images were obtained from the base of the skull through the vertex without intravenous contrast.  COMPARISON:  CT scan of the brain of August 06, 2013  FINDINGS: The ventricles are normal in size and position. There is no intracranial hemorrhage, mass effect, nor acute ischemic change. The cerebellum and brainstem are normal.  There is mucoperiosteal thickening in the right maxillary sinus. The remainder the visualized paranasal sinuses and mastoid air cells are clear. There is no skull fracture or lytic or blastic skull lesion.  IMPRESSION: There is no acute intracranial abnormality. There is chronic opacification of a portion of the right maxillary sinus.   Electronically Signed   By:  David  [place]   On: 09/02/2013 09:27   Ct Angio Chest Aorta W/cm &/or Wo/cm  09/02/2013   CLINICAL DATA:  Right sided abdominal pain described as a tearing pain, radiating to the back. Left-sided weakness and numbness. Evaluate for possible dissection. Current history of diabetes, hypertension, asthma, and unspecified heart valve disorder.  EXAM: CT ANGIOGRAPHY CHEST, ABDOMEN AND PELVIS  TECHNIQUE: Multidetector CT imaging through the chest, abdomen and pelvis was performed using the standard protocol before and during bolus administration of intravenous contrast. Multiplanar reconstructed images and MIPs were obtained and reviewed to evaluate the vascular anatomy.  CONTRAST:  OMNIPAQUE IOHEXOL 350 MG/ML IV.  COMPARISON:  No prior CT chest. Multiple CTs of the abdomen and pelvis dating back to 09/18/2008, most recently 04/26/2013.  FINDINGS: CTA CHEST FINDINGS  Abundant image noise due to body habitus. Unenhanced images demonstrate no mural hematoma in the thoracic aorta. No calcified plaque involving the thoracic aorta or proximal great vessels. No visible coronary calcification.  Enhanced images demonstrate no evidence of thoracic aortic aneurysm or dissection. No visible atherosclerotic plaque. Proximal great vessels widely patent.  Heart size normal. Pulmonary artery is not well opacified, but no large central emboli. No pericardial effusion.  Approximate 8 mm circumscribed nodule in the posterior right lower lobe (series 7, image 69), stable dating back to 2011. Peripheral polygonal approximate 6 mm nodules in the anterior right lower lobe adjacent to the minor fissure (image 69) and in the anterior right upper lobe (image 51), also unchanged. No new or enlarging pulmonary nodules. Lungs clear without localized airspace consolidation or interstitial disease. Central airways patent with mild bronchial wall thickening. No pleural effusions.  Scattered normal size mediastinal, hilar, and axillary lymph  nodes. No significant lymphadenopathy. Visualized thyroid gland unremarkable.  Bone window images unremarkable.  Review of the MIP images confirms the above findings.  CTA ABDOMEN AND PELVIS FINDINGS  Abundant image noise due to body habitus. No evidence of dissection of the abdominal aorta or the iliofemoral arteries. Solitary calcified plaque involving the left common femoral artery. No visible atherosclerotic plaque elsewhere. Single right renal artery and 2 left renal arteries, widely patent. Widely patent celiac artery, SMA and IMA.  Diffuse hepatic steatosis without focal hepatic parenchymal abnormality. Normal appearing spleen, pancreas, right adrenal gland, and kidneys. Approximate 1.6 x 1.5 x 1.8 cm low-attenuation nodule involving the left adrenal gland. Gallbladder unremarkable by CT. No biliary ductal dilation. No significant lymphadenopathy.  Normal appearing stomach and small bowel. Moderate stool burden in the rectum and sigmoid colon. Entire colon normal in appearance. Lipoma involving the ileocecal valve. Normal appearing decompressed appendix in the right upper pelvis. No ascites.  Urinary bladder decompressed and unremarkable. Prostate gland and seminal vesicles normal for age. Small left inguinal hernia  containing fat.  Bone window images unremarkable.  Review of the MIP images confirms the above findings.  IMPRESSION: 1. No evidence of thoracic or abdominal aortic aneurysm or dissection. 2. Minimal atherosclerosis involving the left common femoral artery. No visible atherosclerotic disease elsewhere. 3. Stable right lung nodules dating back to 2011, consistent with benign granulomas and likely subpleural lymph nodes. 4.  No acute cardiopulmonary disease. 5. Hepatic steatosis. 6. No acute abnormalities involving the abdomen or pelvis. 7. Small left inguinal hernia containing fat. 8. Approximate 1.8 cm left adrenal nodule, statistically a benign adenoma.   Electronically Signed   By: Hulan Saashomas   Lawrence M.D.   On: 09/02/2013 09:46   Ct Cta Abd/pel W/cm &/or W/o Cm  09/02/2013   CLINICAL DATA:  Right sided abdominal pain described as a tearing pain, radiating to the back. Left-sided weakness and numbness. Evaluate for possible dissection. Current history of diabetes, hypertension, asthma, and unspecified heart valve disorder.  EXAM: CT ANGIOGRAPHY CHEST, ABDOMEN AND PELVIS  TECHNIQUE: Multidetector CT imaging through the chest, abdomen and pelvis was performed using the standard protocol before and during bolus administration of intravenous contrast. Multiplanar reconstructed images and MIPs were obtained and reviewed to evaluate the vascular anatomy.  CONTRAST:  100mL OMNIPAQUE IOHEXOL 350 MG/ML IV.  COMPARISON:  No prior CT chest. Multiple CTs of the abdomen and pelvis dating back to 09/18/2008, most recently 04/26/2013.  FINDINGS: CTA CHEST FINDINGS  Abundant image noise due to body habitus. Unenhanced images demonstrate no mural hematoma in the thoracic aorta. No calcified plaque involving the thoracic aorta or proximal great vessels. No visible coronary calcification.  Enhanced images demonstrate no evidence of thoracic aortic aneurysm or dissection. No visible atherosclerotic plaque. Proximal great vessels widely patent.  Heart size normal. Pulmonary artery is not well opacified, but no large central emboli. No pericardial effusion.  Approximate 8 mm circumscribed nodule in the posterior right lower lobe (series 7, image 69), stable dating back to 2011. Peripheral polygonal approximate 6 mm nodules in the anterior right lower lobe adjacent to the minor fissure (image 69) and in the anterior right upper lobe (image 51), also unchanged. No new or enlarging pulmonary nodules. Lungs clear without localized airspace consolidation or interstitial disease. Central airways patent with mild bronchial wall thickening. No pleural effusions.  Scattered normal size mediastinal, hilar, and axillary lymph nodes.  No significant lymphadenopathy. Visualized thyroid gland unremarkable.  Bone window images unremarkable.  Review of the MIP images confirms the above findings.  CTA ABDOMEN AND PELVIS FINDINGS  Abundant image noise due to body habitus. No evidence of dissection of the abdominal aorta or the iliofemoral arteries. Solitary calcified plaque involving the left common femoral artery. No visible atherosclerotic plaque elsewhere. Single right renal artery and 2 left renal arteries, widely patent. Widely patent celiac artery, SMA and IMA.  Diffuse hepatic steatosis without focal hepatic parenchymal abnormality. Normal appearing spleen, pancreas, right adrenal gland, and kidneys. Approximate 1.6 x 1.5 x 1.8 cm low-attenuation nodule involving the left adrenal gland. Gallbladder unremarkable by CT. No biliary ductal dilation. No significant lymphadenopathy.  Normal appearing stomach and small bowel. Moderate stool burden in the rectum and sigmoid colon. Entire colon normal in appearance. Lipoma involving the ileocecal valve. Normal appearing decompressed appendix in the right upper pelvis. No ascites.  Urinary bladder decompressed and unremarkable. Prostate gland and seminal vesicles normal for age. Small left inguinal hernia containing fat.  Bone window images unremarkable.  Review of the MIP images confirms the above findings.  IMPRESSION: 1. No evidence of thoracic or abdominal aortic aneurysm or dissection. 2. Minimal atherosclerosis involving the left common femoral artery. No visible atherosclerotic disease elsewhere. 3. Stable right lung nodules dating back to 2011, consistent with benign granulomas and likely subpleural lymph nodes. 4.  No acute cardiopulmonary disease. 5. Hepatic steatosis. 6. No acute abnormalities involving the abdomen or pelvis. 7. Small left inguinal hernia containing fat. 8. Approximate 1.8 cm left adrenal nodule, statistically a benign adenoma.   Electronically Signed   By: Hulan Saas  M.D.   On: 09/02/2013 09:46    Medications:  I have reviewed the patient's current medications. Scheduled: . amphetamine-dextroamphetamine  20 mg Oral Daily  . aspirin  325 mg Oral Daily  . citalopram  10 mg Oral Daily  . cloNIDine  0.2 mg Oral QHS  . heparin  5,000 Units Subcutaneous 3 times per day  . insulin aspart  0-20 Units Subcutaneous TID WC  . linagliptin  5 mg Oral Daily  . lisinopril  20 mg Oral Daily  . mometasone-formoterol  2 puff Inhalation BID  . nicotine  21 mg Transdermal q morning - 10a  . OLANZapine zydis  10 mg Oral QHS  . pantoprazole  40 mg Oral BID  . pneumococcal 23 valent vaccine  0.5 mL Intramuscular Tomorrow-1000  . simvastatin  20 mg Oral q1800  . sodium chloride  3 mL Intravenous Q12H    Assessment/Plan: Patient improved but still not at baseline.  MRI and echo pending.  Remains on ASA.    Recommendations: 1.  Will follow up results of MRI and echo   LOS: 1 day   Thana Farr, MD [facility] [phone] 09/03/2013  1:59 PM

## 2013-09-03 NOTE — Progress Notes (Signed)
Utilization Review Completed.Armonii Sieh T7/07/2013  

## 2013-09-03 NOTE — Progress Notes (Signed)
[facility] PROGRESS NOTE  JAMORI BIGGAR ZOX:[id number] DOB: Jul 21, 1975 DOA: 09/02/2013 PCP: PROVIDER NOT IN SYSTEM  Assessment/Plan: 1-L hemiparesis -improving, exam limited by pain/body habitus -MRI/MRA today  -CT head negative.  -Neurology following -ECHO pending -Carotid duplex recent 6/15 with 1-39% ICA stensis -recent LDL 119 , hbaic 6.1 -PT/OT evaluation  -continue ASA 325mg  for secondary prevention (he was not taking it at home) and statins   2-Diabetes mellitus: last A1C 6.1  -will continue jnumet  -also SSI while inpatient   3-HLD (hyperlipidemia): LDL 119  -patient was not compliant with statins  -resumed statins   4-OSA (obstructive sleep apnea): CPAP automode while inpatient  -needs sleep study at discharge   5-Morbidly obese: low calorie diet and exercise has been discussed with patient   6-Depression with anxiety and insomnia: will continue celexa, PRN xanax and PRN ambien   7-Nonorganic psychosis: continue olanzapine   8-Chronic pain: affecting abdomen and back  -continue home PO narcotic regimen  -has some abd pain, but able to tolerate diet without any symptoms and CT unremarkable -add bowel regimen  9-GERD: continue PPI   DVT: heparin  Code Status: Full Code Family Communication: d/w spouse at bedside Disposition Plan: pending workup   Consultants:  NEuro  HPI/Subjective: Complains of chronic back pain, L side weakness improving  Objective: Filed Vitals:   09/03/13 1009  BP: 162/87  Pulse: 78  Temp: 98.3 F (36.8 C)  Resp: 18   No intake or output data in the 24 hours ending 09/03/13 1012 Filed Weights   09/02/13 1200  Weight: 183 kg (403 lb 7.1 oz)    Exam:   General:  AAOx3  Cardiovascular: S1S2/RRR  Respiratory: CTAB  Abdomen: soft, obese, NT, BS present  Musculoskeletal: no edema c/c   Neuro: LLE 4/5, rest 5/5, exam limited by chronic pain, habitus and effort  Data Reviewed: Basic Metabolic  Panel:  Recent Labs Lab 09/02/13 0839 09/02/13 0849  NA 138 139  K 4.2 4.1  CL 98 100  CO2 27  --   GLUCOSE 156* 153*  BUN 4* <3*  CREATININE 0.72 0.80  CALCIUM 9.2  --    Liver Function Tests:  Recent Labs Lab 09/02/13 0839  AST 21  ALT 20  ALKPHOS 53  BILITOT 0.4  PROT 7.5  ALBUMIN 3.4*    Recent Labs Lab 09/02/13 0839  LIPASE 15   No results found for this basename: AMMONIA,  in the last 168 hours CBC:  Recent Labs Lab 09/02/13 0839 09/02/13 0849  WBC 7.6  --   HGB 14.0 15.3  HCT 41.0 45.0  MCV 90.5  --   PLT 239  --    Cardiac Enzymes: No results found for this basename: CKTOTAL, CKMB, CKMBINDEX, TROPONINI,  in the last 168 hours BNP (last 3 results)  Recent Labs  08/07/13 1330  PROBNP 148.9*   CBG:  Recent Labs Lab 09/02/13 1611 09/02/13 2125 09/03/13 0700  GLUCAP 126* 111* 142*    Recent Results (from the past 240 hour(s))  CLOSTRIDIUM DIFFICILE BY PCR     Status: None   Collection Time    09/02/13  9:46 AM      Result Value Ref Range Status   C difficile by pcr NEGATIVE  NEGATIVE Final     Studies: Ct Head Wo Contrast  09/02/2013   CLINICAL DATA:  Left-sided weakness and numbness with history of diabetes and valvular heart disease  EXAM: CT HEAD WITHOUT CONTRAST  TECHNIQUE: Contiguous  axial images were obtained from the base of the skull through the vertex without intravenous contrast.  COMPARISON:  CT scan of the brain of August 06, 2013  FINDINGS: The ventricles are normal in size and position. There is no intracranial hemorrhage, mass effect, nor acute ischemic change. The cerebellum and brainstem are normal.  There is mucoperiosteal thickening in the right maxillary sinus. The remainder the visualized paranasal sinuses and mastoid air cells are clear. There is no skull fracture or lytic or blastic skull lesion.  IMPRESSION: There is no acute intracranial abnormality. There is chronic opacification of a portion of the right maxillary  sinus.   Electronically Signed   By: David  [place]   On: 09/02/2013 09:27   Ct Angio Chest Aorta W/cm &/or Wo/cm  09/02/2013   CLINICAL DATA:  Right sided abdominal pain described as a tearing pain, radiating to the back. Left-sided weakness and numbness. Evaluate for possible dissection. Current history of diabetes, hypertension, asthma, and unspecified heart valve disorder.  EXAM: CT ANGIOGRAPHY CHEST, ABDOMEN AND PELVIS  TECHNIQUE: Multidetector CT imaging through the chest, abdomen and pelvis was performed using the standard protocol before and during bolus administration of intravenous contrast. Multiplanar reconstructed images and MIPs were obtained and reviewed to evaluate the vascular anatomy.  CONTRAST:  100mL OMNIPAQUE IOHEXOL 350 MG/ML IV.  COMPARISON:  No prior CT chest. Multiple CTs of the abdomen and pelvis dating back to 09/18/2008, most recently 04/26/2013.  FINDINGS: CTA CHEST FINDINGS  Abundant image noise due to body habitus. Unenhanced images demonstrate no mural hematoma in the thoracic aorta. No calcified plaque involving the thoracic aorta or proximal great vessels. No visible coronary calcification.  Enhanced images demonstrate no evidence of thoracic aortic aneurysm or dissection. No visible atherosclerotic plaque. Proximal great vessels widely patent.  Heart size normal. Pulmonary artery is not well opacified, but no large central emboli. No pericardial effusion.  Approximate 8 mm circumscribed nodule in the posterior right lower lobe (series 7, image 69), stable dating back to 2011. Peripheral polygonal approximate 6 mm nodules in the anterior right lower lobe adjacent to the minor fissure (image 69) and in the anterior right upper lobe (image 51), also unchanged. No new or enlarging pulmonary nodules. Lungs clear without localized airspace consolidation or interstitial disease. Central airways patent with mild bronchial wall thickening. No pleural effusions.  Scattered normal size  mediastinal, hilar, and axillary lymph nodes. No significant lymphadenopathy. Visualized thyroid gland unremarkable.  Bone window images unremarkable.  Review of the MIP images confirms the above findings.  CTA ABDOMEN AND PELVIS FINDINGS  Abundant image noise due to body habitus. No evidence of dissection of the abdominal aorta or the iliofemoral arteries. Solitary calcified plaque involving the left common femoral artery. No visible atherosclerotic plaque elsewhere. Single right renal artery and 2 left renal arteries, widely patent. Widely patent celiac artery, SMA and IMA.  Diffuse hepatic steatosis without focal hepatic parenchymal abnormality. Normal appearing spleen, pancreas, right adrenal gland, and kidneys. Approximate 1.6 x 1.5 x 1.8 cm low-attenuation nodule involving the left adrenal gland. Gallbladder unremarkable by CT. No biliary ductal dilation. No significant lymphadenopathy.  Normal appearing stomach and small bowel. Moderate stool burden in the rectum and sigmoid colon. Entire colon normal in appearance. Lipoma involving the ileocecal valve. Normal appearing decompressed appendix in the right upper pelvis. No ascites.  Urinary bladder decompressed and unremarkable. Prostate gland and seminal vesicles normal for age. Small left inguinal hernia containing fat.  Bone window images unremarkable.  Review of the MIP images confirms the above findings.  IMPRESSION: 1. No evidence of thoracic or abdominal aortic aneurysm or dissection. 2. Minimal atherosclerosis involving the left common femoral artery. No visible atherosclerotic disease elsewhere. 3. Stable right lung nodules dating back to 2011, consistent with benign granulomas and likely subpleural lymph nodes. 4.  No acute cardiopulmonary disease. 5. Hepatic steatosis. 6. No acute abnormalities involving the abdomen or pelvis. 7. Small left inguinal hernia containing fat. 8. Approximate 1.8 cm left adrenal nodule, statistically a benign adenoma.    Electronically Signed   By: Hulan Saas M.D.   On: 09/02/2013 09:46   Ct Cta Abd/pel W/cm &/or W/o Cm  09/02/2013   CLINICAL DATA:  Right sided abdominal pain described as a tearing pain, radiating to the back. Left-sided weakness and numbness. Evaluate for possible dissection. Current history of diabetes, hypertension, asthma, and unspecified heart valve disorder.  EXAM: CT ANGIOGRAPHY CHEST, ABDOMEN AND PELVIS  TECHNIQUE: Multidetector CT imaging through the chest, abdomen and pelvis was performed using the standard protocol before and during bolus administration of intravenous contrast. Multiplanar reconstructed images and MIPs were obtained and reviewed to evaluate the vascular anatomy.  CONTRAST:  OMNIPAQUE IOHEXOL 350 MG/ML IV.  COMPARISON:  No prior CT chest. Multiple CTs of the abdomen and pelvis dating back to 09/18/2008, most recently 04/26/2013.  FINDINGS: CTA CHEST FINDINGS  Abundant image noise due to body habitus. Unenhanced images demonstrate no mural hematoma in the thoracic aorta. No calcified plaque involving the thoracic aorta or proximal great vessels. No visible coronary calcification.  Enhanced images demonstrate no evidence of thoracic aortic aneurysm or dissection. No visible atherosclerotic plaque. Proximal great vessels widely patent.  Heart size normal. Pulmonary artery is not well opacified, but no large central emboli. No pericardial effusion.  Approximate 8 mm circumscribed nodule in the posterior right lower lobe (series 7, image 69), stable dating back to 2011. Peripheral polygonal approximate 6 mm nodules in the anterior right lower lobe adjacent to the minor fissure (image 69) and in the anterior right upper lobe (image 51), also unchanged. No new or enlarging pulmonary nodules. Lungs clear without localized airspace consolidation or interstitial disease. Central airways patent with mild bronchial wall thickening. No pleural effusions.  Scattered normal size  mediastinal, hilar, and axillary lymph nodes. No significant lymphadenopathy. Visualized thyroid gland unremarkable.  Bone window images unremarkable.  Review of the MIP images confirms the above findings.  CTA ABDOMEN AND PELVIS FINDINGS  Abundant image noise due to body habitus. No evidence of dissection of the abdominal aorta or the iliofemoral arteries. Solitary calcified plaque involving the left common femoral artery. No visible atherosclerotic plaque elsewhere. Single right renal artery and 2 left renal arteries, widely patent. Widely patent celiac artery, SMA and IMA.  Diffuse hepatic steatosis without focal hepatic parenchymal abnormality. Normal appearing spleen, pancreas, right adrenal gland, and kidneys. Approximate 1.6 x 1.5 x 1.8 cm low-attenuation nodule involving the left adrenal gland. Gallbladder unremarkable by CT. No biliary ductal dilation. No significant lymphadenopathy.  Normal appearing stomach and small bowel. Moderate stool burden in the rectum and sigmoid colon. Entire colon normal in appearance. Lipoma involving the ileocecal valve. Normal appearing decompressed appendix in the right upper pelvis. No ascites.  Urinary bladder decompressed and unremarkable. Prostate gland and seminal vesicles normal for age. Small left inguinal hernia containing fat.  Bone window images unremarkable.  Review of the MIP images confirms the above findings.  IMPRESSION: 1. No evidence of thoracic or  abdominal aortic aneurysm or dissection. 2. Minimal atherosclerosis involving the left common femoral artery. No visible atherosclerotic disease elsewhere. 3. Stable right lung nodules dating back to 2011, consistent with benign granulomas and likely subpleural lymph nodes. 4.  No acute cardiopulmonary disease. 5. Hepatic steatosis. 6. No acute abnormalities involving the abdomen or pelvis. 7. Small left inguinal hernia containing fat. 8. Approximate 1.8 cm left adrenal nodule, statistically a benign adenoma.    Electronically Signed   By: Hulan Saashomas  Lawrence M.D.   On: 09/02/2013 09:46    Scheduled Meds: . amphetamine-dextroamphetamine  20 mg Oral Daily  . aspirin  325 mg Oral Daily  . citalopram  10 mg Oral Daily  . cloNIDine  0.2 mg Oral QHS  . heparin  5,000 Units Subcutaneous 3 times per day  . insulin aspart  0-20 Units Subcutaneous TID WC  . linagliptin  5 mg Oral Daily  . lisinopril  20 mg Oral Daily  . mometasone-formoterol  2 puff Inhalation BID  . OLANZapine zydis  10 mg Oral QHS  . pantoprazole  40 mg Oral BID  . pneumococcal 23 valent vaccine  0.5 mL Intramuscular Tomorrow-1000  . simvastatin  20 mg Oral q1800  . sodium chloride  3 mL Intravenous Q12H   Continuous Infusions:  Antibiotics Given (last 72 hours)   None      Active Problems:   Hemiplegia, unspecified, affecting nondominant side   Diabetes mellitus   HLD (hyperlipidemia)   OSA (obstructive sleep apnea)   Left-sided weakness   Morbidly obese   Depression   Nonorganic psychosis   TIA (transient ischemic attack)    Time spent: 35min    [facility] WheelingJOSEPH,Adisson Deak  [facility] Pager [phone]. If 7PM-7AM, please contact night-coverage at [email or web address], password [email or web address] 09/03/2013, 10:12 AM  LOS: 1 day

## 2013-09-03 NOTE — Progress Notes (Signed)
PT Cancellation Note  Patient Details Name: Bryan Gallegos MRN: [id number] DOB: 06/16/1975   Cancelled Treatment:    Reason Eval/Treat Not Completed: PT screened, no needs identified, will sign off. 09/03/2013  [place] BingKen Nevin Kozuch, PT [phone]  (pager)   Diora Bellizzi, Eliseo GumKenneth V 09/03/2013, 3:41 PM

## 2013-09-04 DIAGNOSIS — G459 Transient cerebral ischemic attack, unspecified: Secondary | ICD-10-CM | POA: Diagnosis not present

## 2013-09-04 DIAGNOSIS — F329 Major depressive disorder, single episode, unspecified: Secondary | ICD-10-CM

## 2013-09-04 DIAGNOSIS — F3289 Other specified depressive episodes: Secondary | ICD-10-CM

## 2013-09-04 LAB — GLUCOSE, CAPILLARY: Glucose-Capillary: 114 mg/dL — ABNORMAL HIGH (ref 70–99)

## 2013-09-04 LAB — LIPID PANEL
CHOLESTEROL: 148 mg/dL (ref 0–200)
HDL: 19 mg/dL — ABNORMAL LOW (ref >39)
LDL Cholesterol: 83 mg/dL (ref 0–99)
Total CHOL/HDL Ratio: 7.8 RATIO
Triglycerides: 228 mg/dL — ABNORMAL HIGH (ref <150)
VLDL: 46 mg/dL — ABNORMAL HIGH (ref 0–40)

## 2013-09-04 MED ORDER — ASPIRIN 325 MG PO TABS: 325.0000 mg | ORAL_TABLET | Freq: Every day | ORAL | Status: DC

## 2013-09-04 NOTE — Discharge Summary (Signed)
Physician Discharge Summary  Bryan Gallegos ZOX:[id number] DOB: 02/02/1976 DOA: 09/02/2013  PCP: PROVIDER NOT IN SYSTEM  Admit date: 09/02/2013 Discharge date: 09/04/2013  Time spent: 45 minutes  Recommendations for Outpatient Follow-up:  1. PCP in 1 week 2. Outpatient Sleep study  Discharge Diagnoses:  Active Problems: TIA    Hemiplegia, unspecified, affecting nondominant side   Diabetes mellitus   HLD (hyperlipidemia)   OSA (obstructive sleep apnea)   Left-sided weakness   Morbidly obese   Depression   Nonorganic psychosis   TIA (transient ischemic attack)   Discharge Condition: stable  Diet recommendation:diabetic  Filed Weights   09/02/13 1200  Weight: 183 kg (403 lb 7.1 oz)    History of present illness:  CC: left side numbness and weakness  HPI: Bryan DuosMitchell L Menna is a 38 y.o. male with PMH significant for HTN, HLD, diabetes, schizophrenia, depression/anxiety, morbidly obesity and asthma; who came to ED complaining of Left side weakness. Patient was admitted approx 1 month ago with similar complaints, at that time CT head neg, carotid dopplers w/o significant stenosis; MRI no completed due to body habitus; patient leave AMA prior to 2-D echo being completed; LDL 119 and A1C 6.1. Patient denies CP, SOB, nausea, vomiting, decrease apetite or any other new complaints.  In ED repeated CT was negative. Patient reports symptoms already improving (especially weakness)  Hospital Course:  -L hemiparesis/TIA -improved,, exam limited by pain/body habitus  -MRI/MRA did not show evidence of CVA  -CT head negative.  -followed by NEurology -ECHO with EF of 55-60% -Carotid duplex recent 6/15 with 1-39% ICA stensis  -recent LDL 119 , hbaic 6.1  -PT/OT evaluation completed, no therapy recommended -continue ASA 325mg  for secondary prevention (he was not taking it at home) and statins   2-Diabetes mellitus: last A1C 6.1  -will continue janumet   3-HLD (hyperlipidemia): LDL 119   -patient was not compliant with statins  -resumed statins   4-OSA (obstructive sleep apnea): CPAP automode while inpatient  -needs sleep study at discharge   5-Morbidly obese: low calorie diet and exercise has been discussed with patient   6-Depression with anxiety and insomnia: will continue celexa, PRN xanax and PRN ambien   7-Nonorganic psychosis: continue olanzapine   8-Chronic pain: affecting abdomen and back  -continue home PO narcotic regimen  -has some abd pain, but able to tolerate diet without any symptoms and CT unremarkable   9-GERD: continue PPI    Consultations:  Neurology  Discharge Exam: Filed Vitals:   09/04/13 0613  BP: 149/79  Pulse: 71  Temp: 98.5 F (36.9 C)  Resp: 18    General:AAOx3 Cardiovascular: S1S2/RRR Respiratory: CTAB  Discharge Instructions You were cared for by a hospitalist during your hospital stay. If you have any questions about your discharge medications or the care you received while you were in the hospital after you are discharged, you can call the unit and asked to speak with the hospitalist on call if the hospitalist that took care of you is not available. Once you are discharged, your primary care physician will handle any further medical issues. Please note that NO REFILLS for any discharge medications will be authorized once you are discharged, as it is imperative that you return to your primary care physician (or establish a relationship with a primary care physician if you do not have one) for your aftercare needs so that they can reassess your need for medications and monitor your lab values.  Discharge Instructions   Diet -  low sodium heart healthy    Complete by:  As directed      Diet Carb Modified    Complete by:  As directed      Increase activity slowly    Complete by:  As directed             Medication List    STOP taking these medications       clindamycin 150 MG capsule  Commonly known as:  CLEOCIN       TAKE these medications       ALPRAZolam 1 MG tablet  Commonly known as:  XANAX  Take 1 mg by mouth 5 (five) times daily as needed for anxiety.     amphetamine-dextroamphetamine 20 MG tablet  Commonly known as:  ADDERALL  Take 40 mg by mouth daily as needed (for additional focus).     aspirin 325 MG tablet  Take 1 tablet (325 mg total) by mouth daily.     citalopram 10 MG tablet  Commonly known as:  CELEXA  Take 10 mg by mouth daily.     cloNIDine 0.2 MG tablet  Commonly known as:  CATAPRES  Take 0.2 mg by mouth at bedtime.     Fluticasone-Salmeterol 250-50 MCG/DOSE Aepb  Commonly known as:  ADVAIR  Inhale 1 puff into the lungs every 12 (twelve) hours.     lisinopril 20 MG tablet  Commonly known as:  PRINIVIL,ZESTRIL  Take 20 mg by mouth daily.     OLANZapine zydis 10 MG disintegrating tablet  Commonly known as:  ZYPREXA  Take 10 mg by mouth at bedtime.     omeprazole 40 MG capsule  Commonly known as:  PRILOSEC  Take 40 mg by mouth daily.     oxycodone 30 MG immediate release tablet  Commonly known as:  ROXICODONE  Take 30 mg by mouth every 4 (four) hours. For pain.     oxyCODONE 5 MG immediate release tablet  Commonly known as:  ROXICODONE  Take 1 tablet (5 mg total) by mouth every 4 (four) hours as needed for moderate pain or severe pain.     pravastatin 40 MG tablet  Commonly known as:  PRAVACHOL  Take 40 mg by mouth at bedtime.     sitaGLIPtin-metformin 50-500 MG per tablet  Commonly known as:  JANUMET  Take 1 tablet by mouth 2 (two) times daily with a meal.     terbinafine 250 MG tablet  Commonly known as:  LAMISIL  Take 250 mg by mouth daily.     zolpidem 10 MG tablet  Commonly known as:  AMBIEN  Take 10 mg by mouth at bedtime as needed for sleep.       Allergies  Allergen Reactions  . Atenolol Anaphylaxis  . Other Anaphylaxis    "Prednisone like drugs"  . Tylenol [Acetaminophen] Other (See Comments)    unknown       Follow-up  Information   Follow up with PCP Today.       The results of significant diagnostics from this hospitalization (including imaging, microbiology, ancillary and laboratory) are listed below for reference.    Significant Diagnostic Studies: Ct Head Wo Contrast  09/02/2013   CLINICAL DATA:  Left-sided weakness and numbness with history of diabetes and valvular heart disease  EXAM: CT HEAD WITHOUT CONTRAST  TECHNIQUE: Contiguous axial images were obtained from the base of the skull through the vertex without intravenous contrast.  COMPARISON:  CT scan of the brain of August 06, 2013  FINDINGS: The ventricles are normal in size and position. There is no intracranial hemorrhage, mass effect, nor acute ischemic change. The cerebellum and brainstem are normal.  There is mucoperiosteal thickening in the right maxillary sinus. The remainder the visualized paranasal sinuses and mastoid air cells are clear. There is no skull fracture or lytic or blastic skull lesion.  IMPRESSION: There is no acute intracranial abnormality. There is chronic opacification of a portion of the right maxillary sinus.   Electronically Signed   By: David  [place]   On: 09/02/2013 09:27   Ct Head Wo Contrast  08/06/2013   CLINICAL DATA:  Code stroke, lethargy  EXAM: CT HEAD WITHOUT CONTRAST  TECHNIQUE: Contiguous axial images were obtained from the base of the skull through the vertex without intravenous contrast.  COMPARISON:  04/07/2012  FINDINGS: No evidence of parenchymal hemorrhage or extra-axial fluid collection. No mass lesion, mass effect, or midline shift.  No CT evidence of acute infarction.  Cerebral volume is within normal limits.  No ventriculomegaly.  Visualized paranasal sinuses are essentially clear. Partial opacification of the right mastoid air cells (series 3/image 15), chronic.  No evidence of calvarial fracture.  IMPRESSION: No evidence of acute intracranial abnormality.  These results were called by telephone at the time of  interpretation on 08/06/2013 at 7:07 PM to Dr. Marena Chancy , who verbally acknowledged these results.   Electronically Signed   By: Charline Bills M.D.   On: 08/06/2013 19:07   Mr Maxine Glenn Head Wo Contrast  09/03/2013   CLINICAL DATA:  Left sided weakness and numbness. Hypertension. Diabetes. Hyperlipidemia. Morbid obesity.  EXAM: MRI HEAD WITHOUT CONTRAST  MRA HEAD WITHOUT CONTRAST  TECHNIQUE: Multiplanar, multiecho pulse sequences of the brain and surrounding structures were obtained without intravenous contrast. Angiographic images of the head were obtained using MRA technique without contrast.  COMPARISON:  09/02/2013 CT.  No comparison MR.  FINDINGS: MRI HEAD FINDINGS  Some of the images are motion degraded. Fast technique imaging had to be utilized.  No acute infarct.  No intracranial hemorrhage.  No intracranial mass lesion noted on this unenhanced exam.  No hydrocephalus.  Exophthalmos.  Partial opacification mastoid air cells without mass noted causing eustachian tube dysfunction. Opacification aerated aspect upper right pterygoid plate. Minimal mucosal thickening right maxillary sinus.  MRA HEAD FINDINGS  Examination is motion degraded. This limits evaluation for grading stenosis or detecting aneurysm.  All that can be stated with certainty on the present examination is that there is flow within portions of the vertebral arteries, basilar artery, internal carotid arteries, middle cerebral arteries, anterior cerebral arteries and posterior cerebral arteries.  IMPRESSION: MRI HEAD :  Some of the images are motion degraded. Fast technique imaging had to be utilized.  No acute infarct.  Exophthalmos.  Partial opacification mastoid air cells without mass noted causing eustachian tube dysfunction. Opacification aerated aspect upper right pterygoid plate. Minimal mucosal thickening right maxillary sinus.  MRA HEAD :  Examination is motion degraded. This limits evaluation for grading stenosis or detecting  aneurysm.  All that can be stated with certainty on the present examination is that there is flow within portions of the vertebral arteries, basilar artery, internal carotid arteries, middle cerebral arteries, anterior cerebral arteries and posterior cerebral arteries.   Electronically Signed   By: Bridgett Larsson M.D.   On: 09/03/2013 22:22   Mr Brain Wo Contrast  09/03/2013   CLINICAL DATA:  Left sided weakness and numbness. Hypertension. Diabetes. Hyperlipidemia. Morbid  obesity.  EXAM: MRI HEAD WITHOUT CONTRAST  MRA HEAD WITHOUT CONTRAST  TECHNIQUE: Multiplanar, multiecho pulse sequences of the brain and surrounding structures were obtained without intravenous contrast. Angiographic images of the head were obtained using MRA technique without contrast.  COMPARISON:  09/02/2013 CT.  No comparison MR.  FINDINGS: MRI HEAD FINDINGS  Some of the images are motion degraded. Fast technique imaging had to be utilized.  No acute infarct.  No intracranial hemorrhage.  No intracranial mass lesion noted on this unenhanced exam.  No hydrocephalus.  Exophthalmos.  Partial opacification mastoid air cells without mass noted causing eustachian tube dysfunction. Opacification aerated aspect upper right pterygoid plate. Minimal mucosal thickening right maxillary sinus.  MRA HEAD FINDINGS  Examination is motion degraded. This limits evaluation for grading stenosis or detecting aneurysm.  All that can be stated with certainty on the present examination is that there is flow within portions of the vertebral arteries, basilar artery, internal carotid arteries, middle cerebral arteries, anterior cerebral arteries and posterior cerebral arteries.  IMPRESSION: MRI HEAD :  Some of the images are motion degraded. Fast technique imaging had to be utilized.  No acute infarct.  Exophthalmos.  Partial opacification mastoid air cells without mass noted causing eustachian tube dysfunction. Opacification aerated aspect upper right pterygoid plate.  Minimal mucosal thickening right maxillary sinus.  MRA HEAD :  Examination is motion degraded. This limits evaluation for grading stenosis or detecting aneurysm.  All that can be stated with certainty on the present examination is that there is flow within portions of the vertebral arteries, basilar artery, internal carotid arteries, middle cerebral arteries, anterior cerebral arteries and posterior cerebral arteries.   Electronically Signed   By: Bridgett LarssonSteve  Olson M.D.   On: 09/03/2013 22:22   Dg Hand Complete Left  08/06/2013   CLINICAL DATA:  BILATERAL hand swelling and redness for 3 months, history hypertension, diabetes  EXAM: LEFT HAND - COMPLETE 3+ VIEW  COMPARISON:  None  FINDINGS: Marked diffuse soft tissue swelling.  Osseous mineralization grossly normal for technique.  No acute fracture, dislocation, or bone destruction.  Bones unremarkable.  IMPRESSION: No acute osseous abnormalities.  Marked soft tissue swelling.   Electronically Signed   By: Ulyses SouthwardMark  Boles M.D.   On: 08/06/2013 21:58   Dg Hand Complete Right  08/06/2013   CLINICAL DATA:  Bilateral hand swelling and redness for 3 months.  EXAM: RIGHT HAND - COMPLETE 3+ VIEW  COMPARISON:  None.  FINDINGS: Diffuse soft tissue swelling of the right hand and wrist. No radiopaque soft tissue foreign bodies. No evidence of acute fracture or dislocation. There appears to be an old fracture deformity of the fifth metacarpal bone. No bone erosion or periosteal reaction noted.  IMPRESSION: Diffuse soft tissue swelling.  No acute bony abnormalities.   Electronically Signed   By: Burman NievesWilliam  Stevens M.D.   On: 08/06/2013 21:57   Ct Angio Chest Aorta W/cm &/or Wo/cm  09/02/2013   CLINICAL DATA:  Right sided abdominal pain described as a tearing pain, radiating to the back. Left-sided weakness and numbness. Evaluate for possible dissection. Current history of diabetes, hypertension, asthma, and unspecified heart valve disorder.  EXAM: CT ANGIOGRAPHY CHEST, ABDOMEN AND  PELVIS  TECHNIQUE: Multidetector CT imaging through the chest, abdomen and pelvis was performed using the standard protocol before and during bolus administration of intravenous contrast. Multiplanar reconstructed images and MIPs were obtained and reviewed to evaluate the vascular anatomy.  CONTRAST:  100mL OMNIPAQUE IOHEXOL 350 MG/ML IV.  COMPARISON:  No prior CT chest. Multiple CTs of the abdomen and pelvis dating back to 09/18/2008, most recently 04/26/2013.  FINDINGS: CTA CHEST FINDINGS  Abundant image noise due to body habitus. Unenhanced images demonstrate no mural hematoma in the thoracic aorta. No calcified plaque involving the thoracic aorta or proximal great vessels. No visible coronary calcification.  Enhanced images demonstrate no evidence of thoracic aortic aneurysm or dissection. No visible atherosclerotic plaque. Proximal great vessels widely patent.  Heart size normal. Pulmonary artery is not well opacified, but no large central emboli. No pericardial effusion.  Approximate 8 mm circumscribed nodule in the posterior right lower lobe (series 7, image 69), stable dating back to 2011. Peripheral polygonal approximate 6 mm nodules in the anterior right lower lobe adjacent to the minor fissure (image 69) and in the anterior right upper lobe (image 51), also unchanged. No new or enlarging pulmonary nodules. Lungs clear without localized airspace consolidation or interstitial disease. Central airways patent with mild bronchial wall thickening. No pleural effusions.  Scattered normal size mediastinal, hilar, and axillary lymph nodes. No significant lymphadenopathy. Visualized thyroid gland unremarkable.  Bone window images unremarkable.  Review of the MIP images confirms the above findings.  CTA ABDOMEN AND PELVIS FINDINGS  Abundant image noise due to body habitus. No evidence of dissection of the abdominal aorta or the iliofemoral arteries. Solitary calcified plaque involving the left common femoral artery.  No visible atherosclerotic plaque elsewhere. Single right renal artery and 2 left renal arteries, widely patent. Widely patent celiac artery, SMA and IMA.  Diffuse hepatic steatosis without focal hepatic parenchymal abnormality. Normal appearing spleen, pancreas, right adrenal gland, and kidneys. Approximate 1.6 x 1.5 x 1.8 cm low-attenuation nodule involving the left adrenal gland. Gallbladder unremarkable by CT. No biliary ductal dilation. No significant lymphadenopathy.  Normal appearing stomach and small bowel. Moderate stool burden in the rectum and sigmoid colon. Entire colon normal in appearance. Lipoma involving the ileocecal valve. Normal appearing decompressed appendix in the right upper pelvis. No ascites.  Urinary bladder decompressed and unremarkable. Prostate gland and seminal vesicles normal for age. Small left inguinal hernia containing fat.  Bone window images unremarkable.  Review of the MIP images confirms the above findings.  IMPRESSION: 1. No evidence of thoracic or abdominal aortic aneurysm or dissection. 2. Minimal atherosclerosis involving the left common femoral artery. No visible atherosclerotic disease elsewhere. 3. Stable right lung nodules dating back to 2011, consistent with benign granulomas and likely subpleural lymph nodes. 4.  No acute cardiopulmonary disease. 5. Hepatic steatosis. 6. No acute abnormalities involving the abdomen or pelvis. 7. Small left inguinal hernia containing fat. 8. Approximate 1.8 cm left adrenal nodule, statistically a benign adenoma.   Electronically Signed   By: Hulan Saas M.D.   On: 09/02/2013 09:46   Ct Angio Chest Aortic Dissect W &/or W/o  08/06/2013   CLINICAL DATA:  Midsternal chest pain, lethargy.  EXAM: CT ANGIOGRAPHY CHEST, ABDOMEN AND PELVIS  TECHNIQUE: Multidetector CT imaging through the chest, abdomen and pelvis was performed using the standard protocol during bolus administration of intravenous contrast. Multiplanar reconstructed  images and MIPs were obtained and reviewed to evaluate the vascular anatomy.  CONTRAST:  OMNIPAQUE IOHEXOL 350 MG/ML SOLN  COMPARISON:  CT abdomen pelvis dated 04/26/2013. CTA chest dated 10/11/2009.  FINDINGS: CTA CHEST FINDINGS  On unenhanced imaging, there is no evidence of intramural hematoma.  No evidence of thoracic aortic aneurysm or dissection.  Although not optimally timed for evaluation of the pulmonary arteries, there  is no evidence of pulmonary embolism.  Bilateral pulmonary nodules, unchanged from 2011, including:  --4 mm subpleural right lower lobe nodule (series 8/ image 47)  --7 mm posterior right lower lobe nodule (series 8/ image 47)  --5 mm subpleural left lower lobe nodule (series 8/image 49)  Concern effusion  Visualized thyroid is unremarkable.  The heart is normal in size.  No pericardial effusion.  Mildly prominent nodes, including a 10 mm short axis subcarinal node (series 5/image 38) and a 12 mm short axis right perihilar node (series 5/ image 41), grossly unchanged from 2011, likely benign.  Review of the MIP images confirms the above findings.  CTA ABDOMEN AND PELVIS FINDINGS  No evidence of abdominal aortic aneurysm or dissection.  Celiac artery, SMA, bilateral renal arteries, and IMA are patent. Bilateral iliac artery systems remain patent.  Moderate hepatic steatosis.  Spleen, pancreas, and adrenal glands are within normal limits. Gallbladder is unremarkable. No intrahepatic or extrahepatic ductal dilatation.  Kidneys are within normal limits.  No hydronephrosis.  No evidence of bowel obstruction.  Normal appendix.  No abdominopelvic ascites.  Prominent upper abdominal nodes, including a 17 mm peripancreatic node (series 5/ image 95) and a 15 mm short axis portacaval node (series 5/ image 105), grossly unchanged and likely reactive. No suspicious retroperitoneal/pelvic lymphadenopathy.  Prostate is unremarkable.  Bladder is within normal limits.  Small fat containing left femoral  hernia.  Visualized osseous structures are within normal limits.  Review of the MIP images confirms the above findings.  IMPRESSION: No evidence of thoracoabdominal aortic aneurysm or dissection.  No evidence of pulmonary embolism.  Stable ancillary findings as above.   Electronically Signed   By: Charline Bills M.D.   On: 08/06/2013 19:20   Ct Cta Abd/pel W/cm &/or W/o Cm  09/02/2013   CLINICAL DATA:  Right sided abdominal pain described as a tearing pain, radiating to the back. Left-sided weakness and numbness. Evaluate for possible dissection. Current history of diabetes, hypertension, asthma, and unspecified heart valve disorder.  EXAM: CT ANGIOGRAPHY CHEST, ABDOMEN AND PELVIS  TECHNIQUE: Multidetector CT imaging through the chest, abdomen and pelvis was performed using the standard protocol before and during bolus administration of intravenous contrast. Multiplanar reconstructed images and MIPs were obtained and reviewed to evaluate the vascular anatomy.  CONTRAST:  OMNIPAQUE IOHEXOL 350 MG/ML IV.  COMPARISON:  No prior CT chest. Multiple CTs of the abdomen and pelvis dating back to 09/18/2008, most recently 04/26/2013.  FINDINGS: CTA CHEST FINDINGS  Abundant image noise due to body habitus. Unenhanced images demonstrate no mural hematoma in the thoracic aorta. No calcified plaque involving the thoracic aorta or proximal great vessels. No visible coronary calcification.  Enhanced images demonstrate no evidence of thoracic aortic aneurysm or dissection. No visible atherosclerotic plaque. Proximal great vessels widely patent.  Heart size normal. Pulmonary artery is not well opacified, but no large central emboli. No pericardial effusion.  Approximate 8 mm circumscribed nodule in the posterior right lower lobe (series 7, image 69), stable dating back to 2011. Peripheral polygonal approximate 6 mm nodules in the anterior right lower lobe adjacent to the minor fissure (image 69) and in the anterior right  upper lobe (image 51), also unchanged. No new or enlarging pulmonary nodules. Lungs clear without localized airspace consolidation or interstitial disease. Central airways patent with mild bronchial wall thickening. No pleural effusions.  Scattered normal size mediastinal, hilar, and axillary lymph nodes. No significant lymphadenopathy. Visualized thyroid gland unremarkable.  Bone window images  unremarkable.  Review of the MIP images confirms the above findings.  CTA ABDOMEN AND PELVIS FINDINGS  Abundant image noise due to body habitus. No evidence of dissection of the abdominal aorta or the iliofemoral arteries. Solitary calcified plaque involving the left common femoral artery. No visible atherosclerotic plaque elsewhere. Single right renal artery and 2 left renal arteries, widely patent. Widely patent celiac artery, SMA and IMA.  Diffuse hepatic steatosis without focal hepatic parenchymal abnormality. Normal appearing spleen, pancreas, right adrenal gland, and kidneys. Approximate 1.6 x 1.5 x 1.8 cm low-attenuation nodule involving the left adrenal gland. Gallbladder unremarkable by CT. No biliary ductal dilation. No significant lymphadenopathy.  Normal appearing stomach and small bowel. Moderate stool burden in the rectum and sigmoid colon. Entire colon normal in appearance. Lipoma involving the ileocecal valve. Normal appearing decompressed appendix in the right upper pelvis. No ascites.  Urinary bladder decompressed and unremarkable. Prostate gland and seminal vesicles normal for age. Small left inguinal hernia containing fat.  Bone window images unremarkable.  Review of the MIP images confirms the above findings.  IMPRESSION: 1. No evidence of thoracic or abdominal aortic aneurysm or dissection. 2. Minimal atherosclerosis involving the left common femoral artery. No visible atherosclerotic disease elsewhere. 3. Stable right lung nodules dating back to 2011, consistent with benign granulomas and likely  subpleural lymph nodes. 4.  No acute cardiopulmonary disease. 5. Hepatic steatosis. 6. No acute abnormalities involving the abdomen or pelvis. 7. Small left inguinal hernia containing fat. 8. Approximate 1.8 cm left adrenal nodule, statistically a benign adenoma.   Electronically Signed   By: Hulan Saas M.D.   On: 09/02/2013 09:46   Ct Angio Abd/pel W/ And/or W/o  08/06/2013   CLINICAL DATA:  Midsternal chest pain, lethargy.  EXAM: CT ANGIOGRAPHY CHEST, ABDOMEN AND PELVIS  TECHNIQUE: Multidetector CT imaging through the chest, abdomen and pelvis was performed using the standard protocol during bolus administration of intravenous contrast. Multiplanar reconstructed images and MIPs were obtained and reviewed to evaluate the vascular anatomy.  CONTRAST:  OMNIPAQUE IOHEXOL 350 MG/ML SOLN  COMPARISON:  CT abdomen pelvis dated 04/26/2013. CTA chest dated 10/11/2009.  FINDINGS: CTA CHEST FINDINGS  On unenhanced imaging, there is no evidence of intramural hematoma.  No evidence of thoracic aortic aneurysm or dissection.  Although not optimally timed for evaluation of the pulmonary arteries, there is no evidence of pulmonary embolism.  Bilateral pulmonary nodules, unchanged from 2011, including:  --4 mm subpleural right lower lobe nodule (series 8/ image 47)  --7 mm posterior right lower lobe nodule (series 8/ image 47)  --5 mm subpleural left lower lobe nodule (series 8/image 49)  Concern effusion  Visualized thyroid is unremarkable.  The heart is normal in size.  No pericardial effusion.  Mildly prominent nodes, including a 10 mm short axis subcarinal node (series 5/image 38) and a 12 mm short axis right perihilar node (series 5/ image 41), grossly unchanged from 2011, likely benign.  Review of the MIP images confirms the above findings.  CTA ABDOMEN AND PELVIS FINDINGS  No evidence of abdominal aortic aneurysm or dissection.  Celiac artery, SMA, bilateral renal arteries, and IMA are patent. Bilateral iliac  artery systems remain patent.  Moderate hepatic steatosis.  Spleen, pancreas, and adrenal glands are within normal limits. Gallbladder is unremarkable. No intrahepatic or extrahepatic ductal dilatation.  Kidneys are within normal limits.  No hydronephrosis.  No evidence of bowel obstruction.  Normal appendix.  No abdominopelvic ascites.  Prominent upper abdominal nodes, including  a 17 mm peripancreatic node (series 5/ image 95) and a 15 mm short axis portacaval node (series 5/ image 105), grossly unchanged and likely reactive. No suspicious retroperitoneal/pelvic lymphadenopathy.  Prostate is unremarkable.  Bladder is within normal limits.  Small fat containing left femoral hernia.  Visualized osseous structures are within normal limits.  Review of the MIP images confirms the above findings.  IMPRESSION: No evidence of thoracoabdominal aortic aneurysm or dissection.  No evidence of pulmonary embolism.  Stable ancillary findings as above.   Electronically Signed   By: Charline Bills M.D.   On: 08/06/2013 19:20    Microbiology: Recent Results (from the past 240 hour(s))  CLOSTRIDIUM DIFFICILE BY PCR     Status: None   Collection Time    09/02/13  9:46 AM      Result Value Ref Range Status   C difficile by pcr NEGATIVE  NEGATIVE Final     Labs: Basic Metabolic Panel:  Recent Labs Lab 09/02/13 0839 09/02/13 0849  NA 138 139  K 4.2 4.1  CL 98 100  CO2 27  --   GLUCOSE 156* 153*  BUN 4* <3*  CREATININE 0.72 0.80  CALCIUM 9.2  --    Liver Function Tests:  Recent Labs Lab 09/02/13 0839  AST 21  ALT 20  ALKPHOS 53  BILITOT 0.4  PROT 7.5  ALBUMIN 3.4*    Recent Labs Lab 09/02/13 0839  LIPASE 15   No results found for this basename: AMMONIA,  in the last 168 hours CBC:  Recent Labs Lab 09/02/13 0839 09/02/13 0849  WBC 7.6  --   HGB 14.0 15.3  HCT 41.0 45.0  MCV 90.5  --   PLT 239  --    Cardiac Enzymes: No results found for this basename: CKTOTAL, CKMB, CKMBINDEX,  TROPONINI,  in the last 168 hours BNP: BNP (last 3 results)  Recent Labs  08/07/13 1330  PROBNP 148.9*   CBG:  Recent Labs Lab 09/03/13 0700 09/03/13 1221 09/03/13 1618 09/03/13 2200 09/04/13 0732  GLUCAP 142* 145* 136* 188* 114*       Signed:  Antavious Spanos  [facility] 09/04/2013, 10:24 AM

## 2013-11-17 IMAGING — CR DG KNEE COMPLETE 4+V*R*
4 series · 4 of 4 positions shown · non-contrast
Comparison: 08/19/2010

CLINICAL DATA: Right knee pain status post fall.

RIGHT KNEE - COMPLETE 4+ VIEW

[view not recorded (1 of 4)]
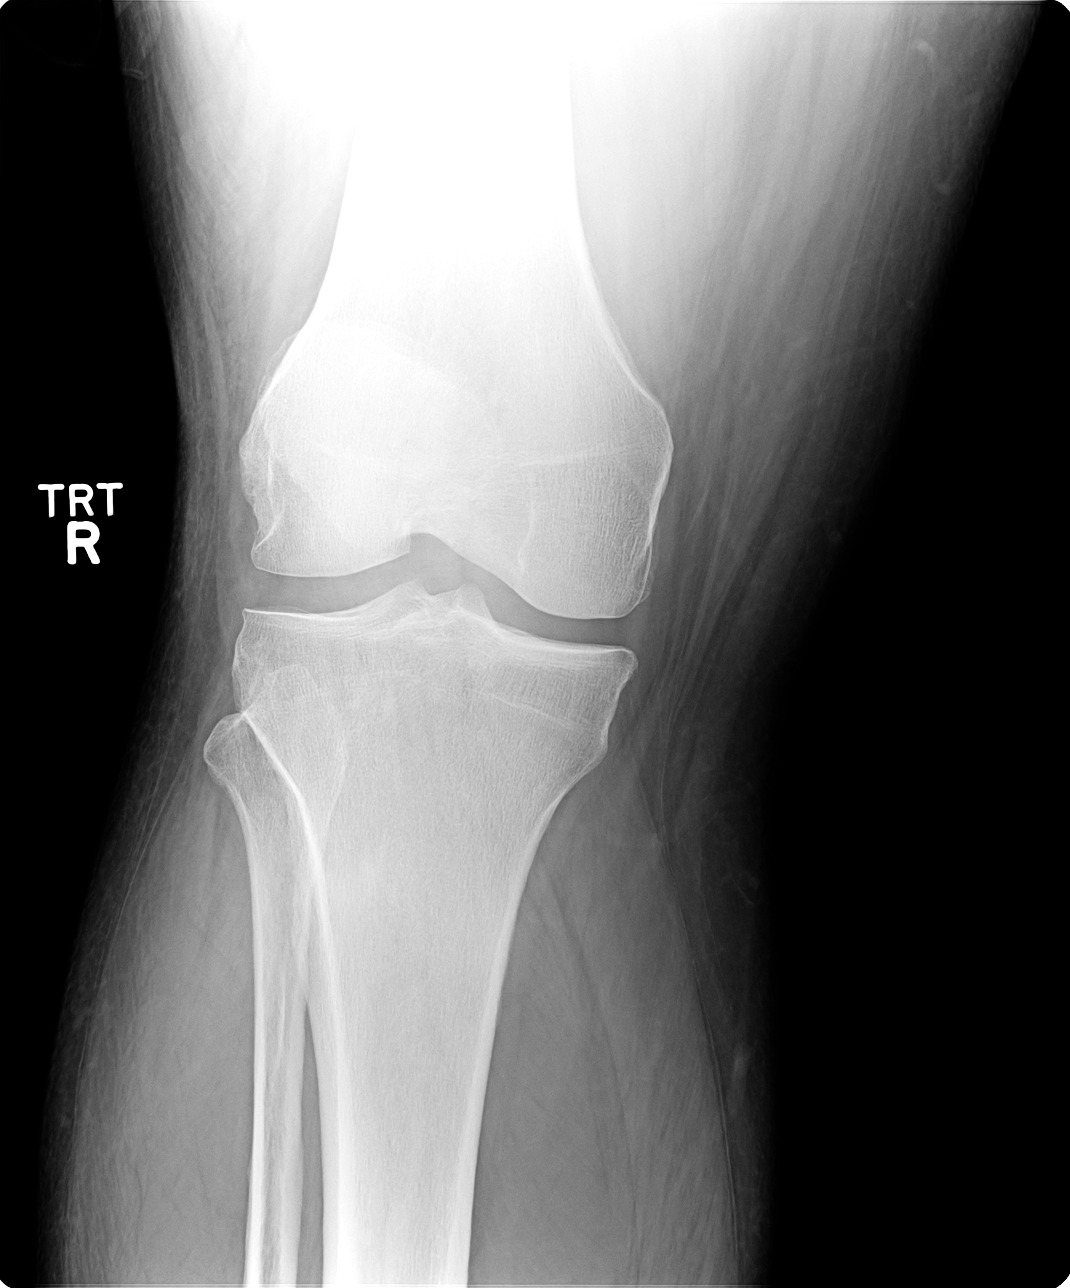

[view not recorded (2 of 4)]
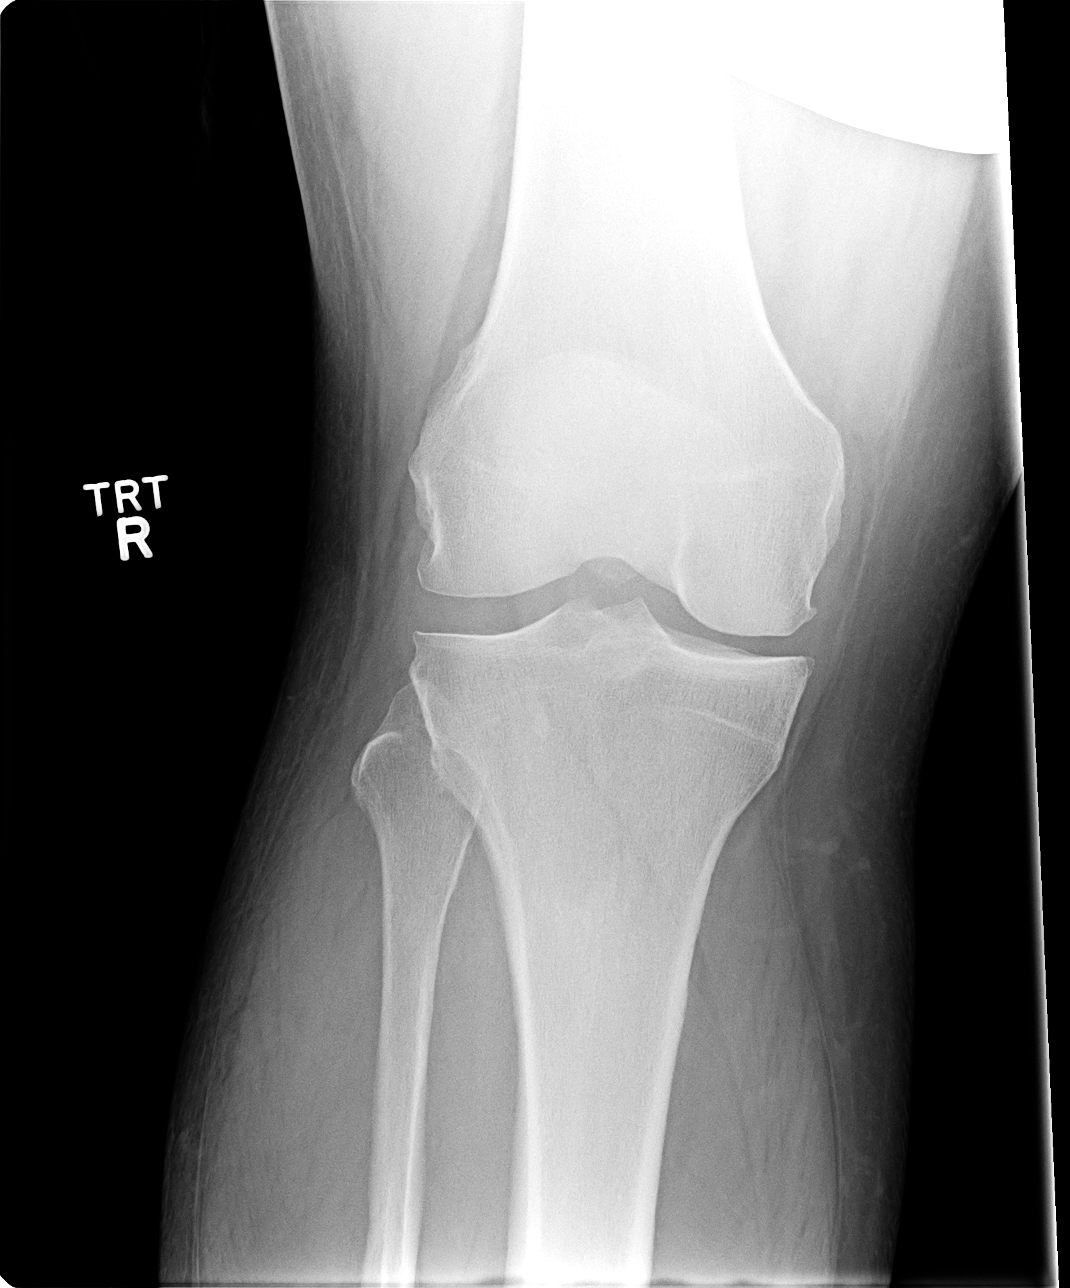

[view not recorded (3 of 4)]
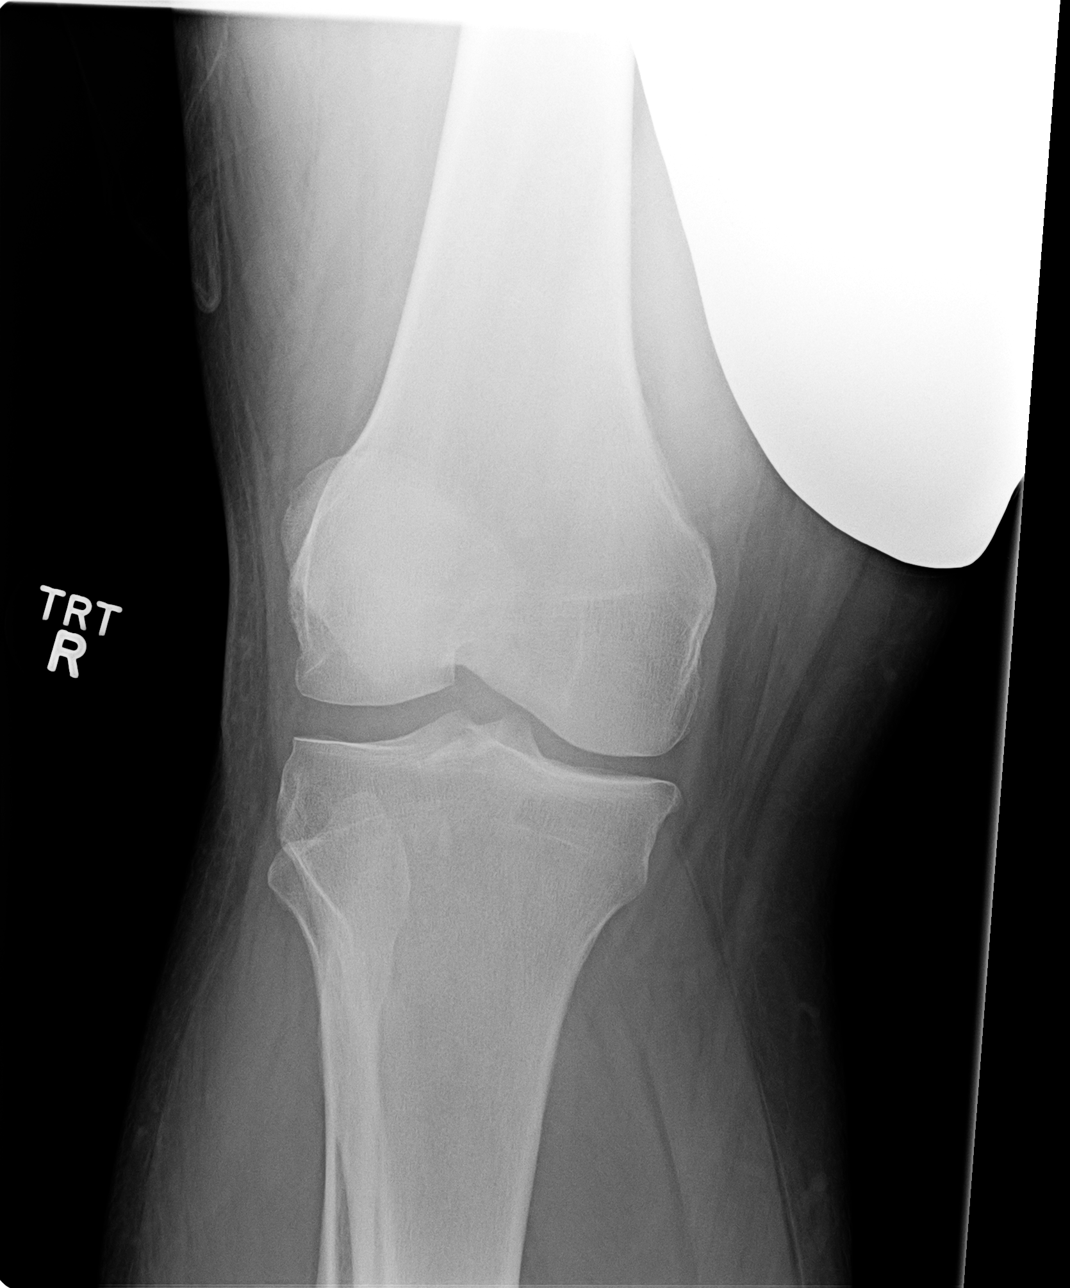

[view not recorded (4 of 4)]
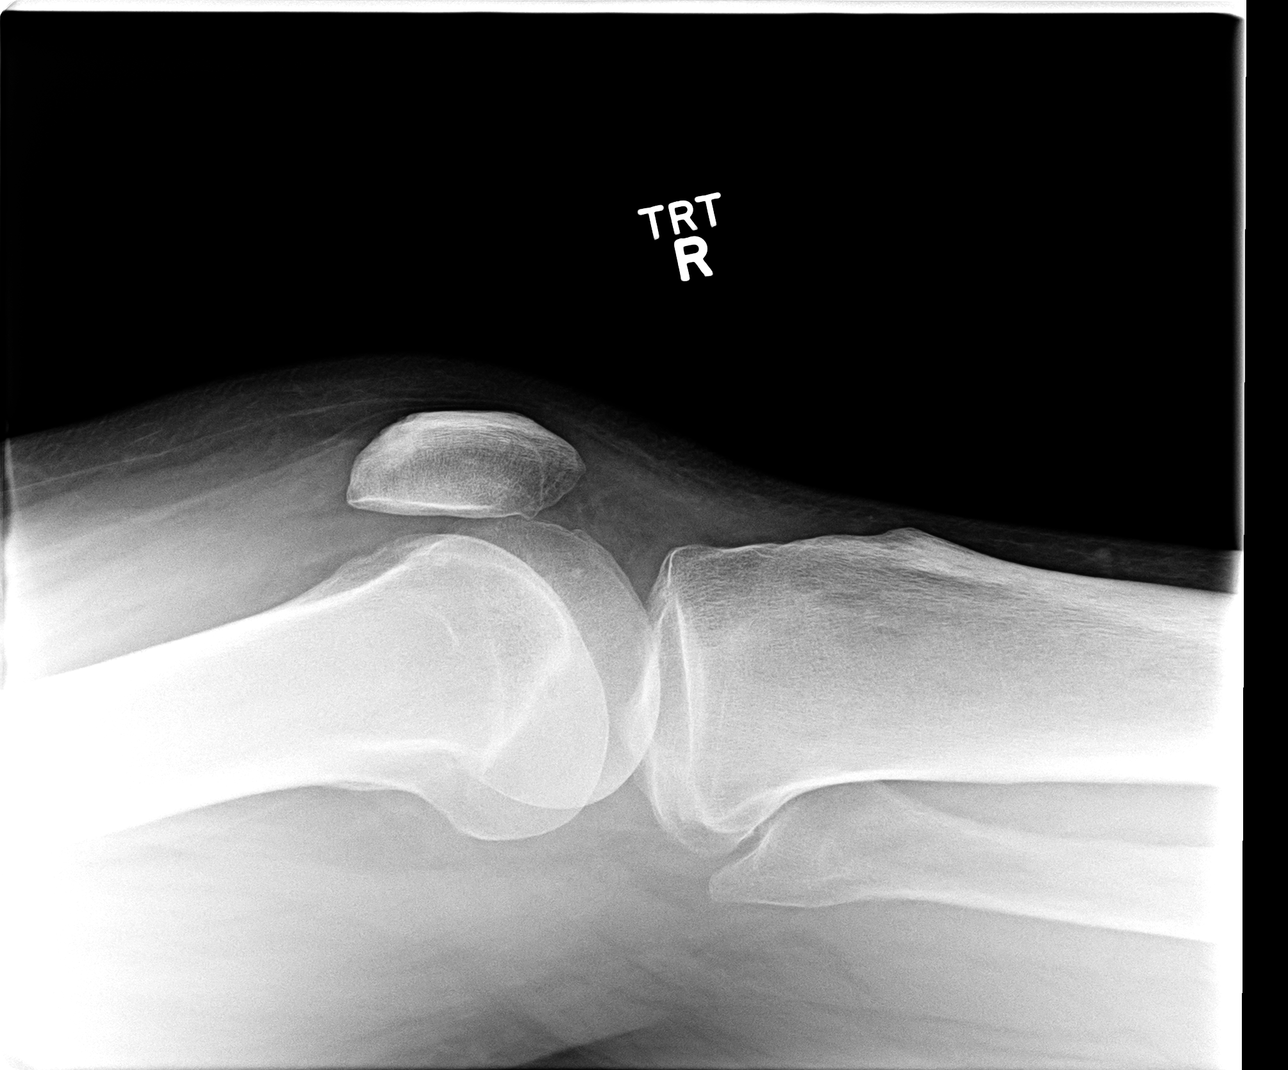

[4 of 4 positions shown; findings below may reference images not displayed]

FINDINGS: Mild tricompartmental degenerative changes.  Linear
lucency through the medial tibial plateau.  No significant
displacement. Small joint effusion.
IMPRESSION: Linear lucency through the medial tibial plateau is suspicious for
a nondisplaced fracture.

Joint effusion.

## 2013-11-18 IMAGING — CR DG LUMBAR SPINE COMPLETE 4+V
5 series · 5 of 5 positions shown · non-contrast
Comparison: CT 06/19/2011.

CLINICAL DATA: Low back pain.  Right upper leg pain.

LUMBAR SPINE - COMPLETE 4+ VIEW

[t l-spine a.p. *]
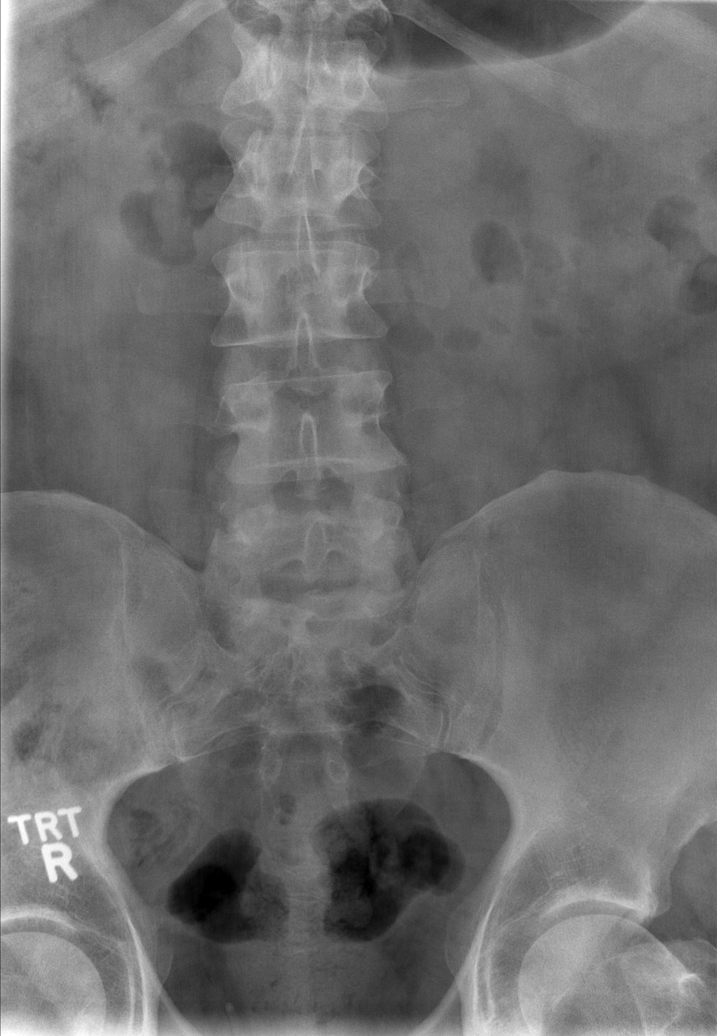

[t l-spine oblique exposure]
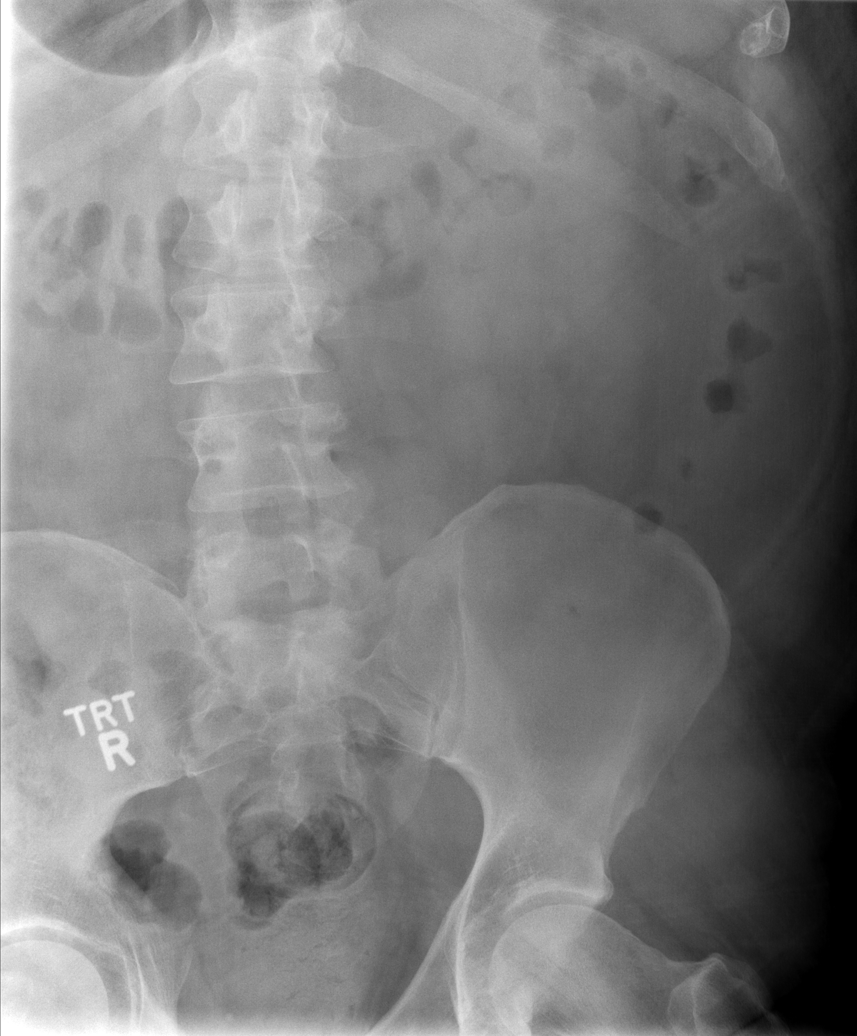

[t l-spine oblique exposure *]
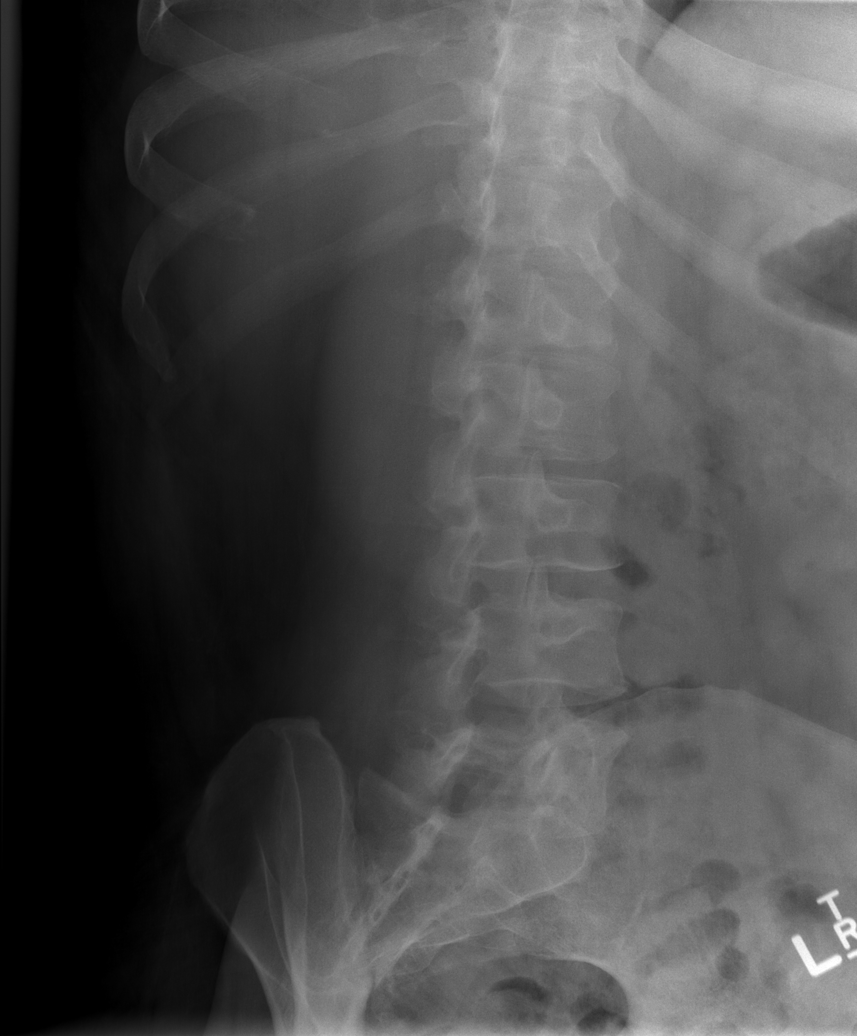

[t l-spine lat *]
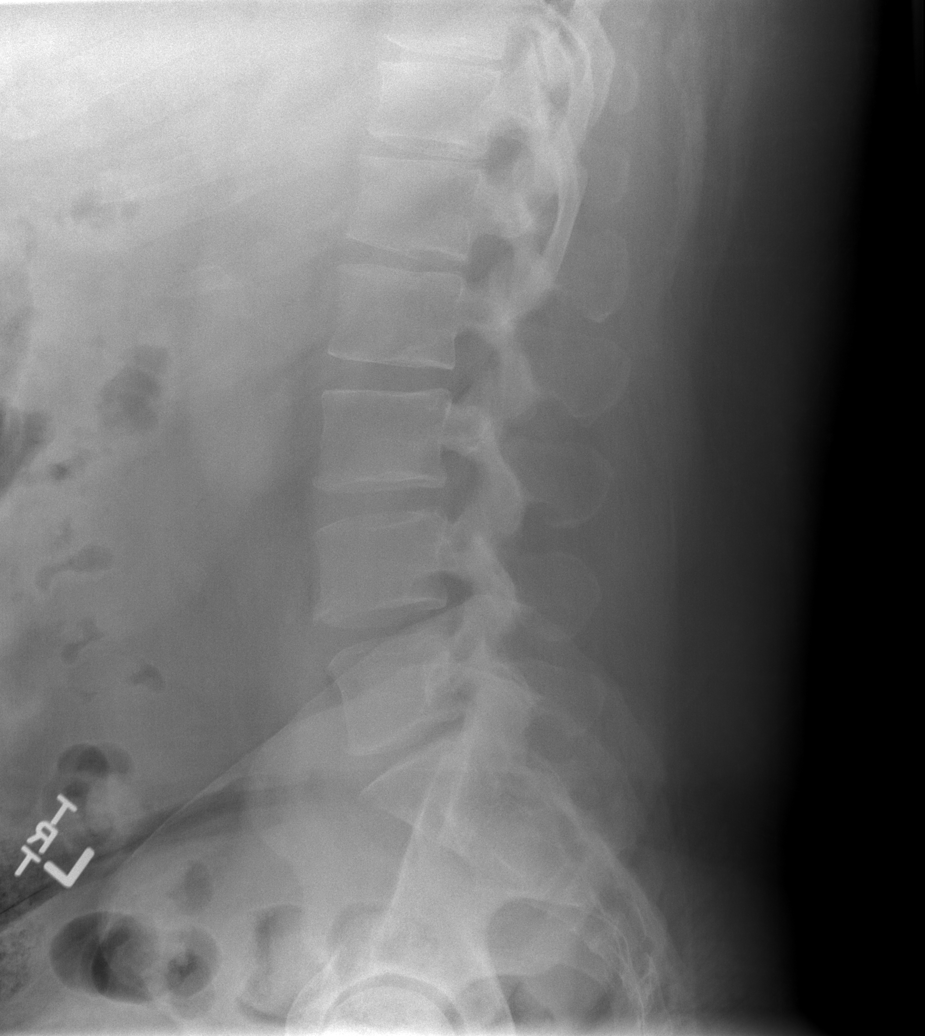

[t l-spine l5-s1 spot *]
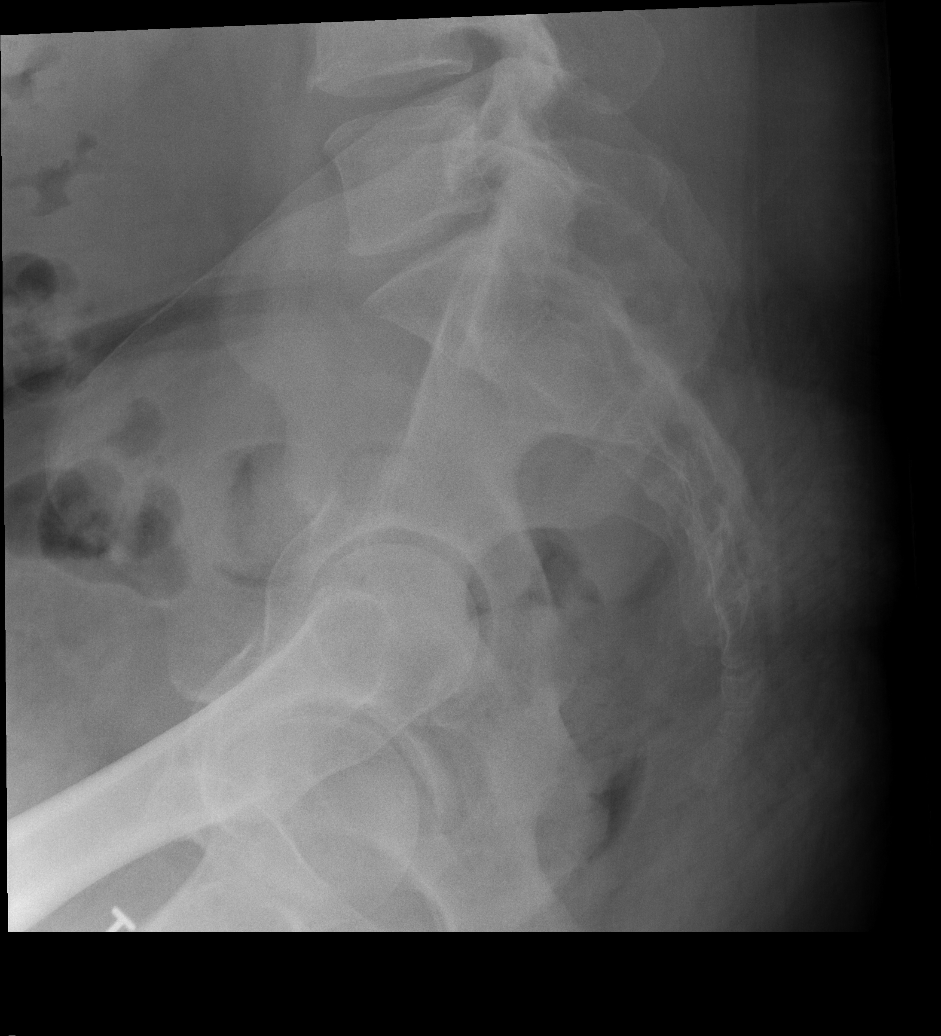

[5 of 5 positions shown; findings below may reference images not displayed]

FINDINGS: There is a mild S-shaped lumbar scoliosis.  Vertebral
body height is preserved.  No fracture.  No pars defects.  Mild L4-
L5 degenerative disc disease.
IMPRESSION: No acute osseous abnormality.

## 2013-11-18 IMAGING — CT CT KNEE*R* W/O CM
3 series · 16 of 33 positions shown, 19 images · non-contrast
Comparison: Radiographs 02/18/2012.

CLINICAL DATA: Fall.  Right leg pain.  Knee pain.

CT OF THE RIGHT KNEE WITHOUT CONTRAST
TECHNIQUE: Multidetector CT imaging was performed according to the
standard protocol. Multiplanar CT image reconstructions were also
generated.

[Series 4: knee 2.0 b31s · axial · 0.48mm/px · z∈[-402,-176]mm · 8 of 135 slices shown, 10 images]
[im 11/135  soft-tissue]
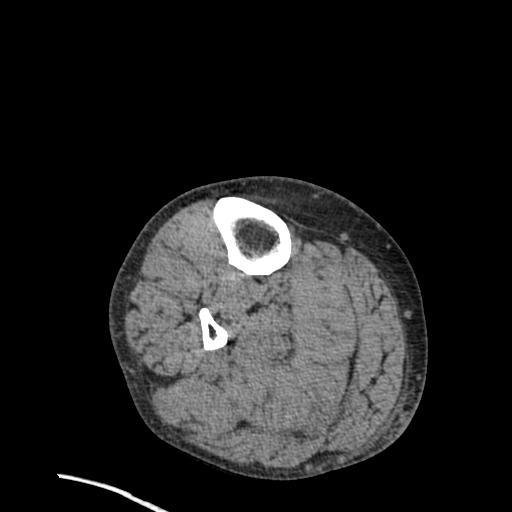
[im 11/135  bone]
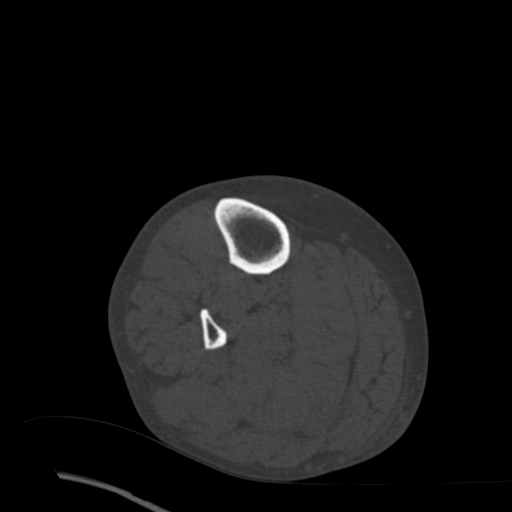
[im 31/135  bone]
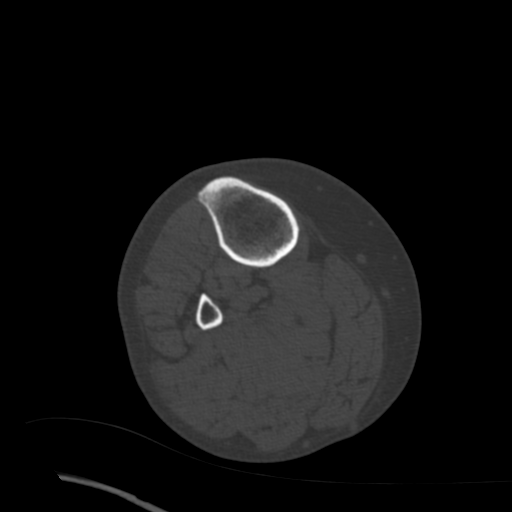
[im 42/135  bone]
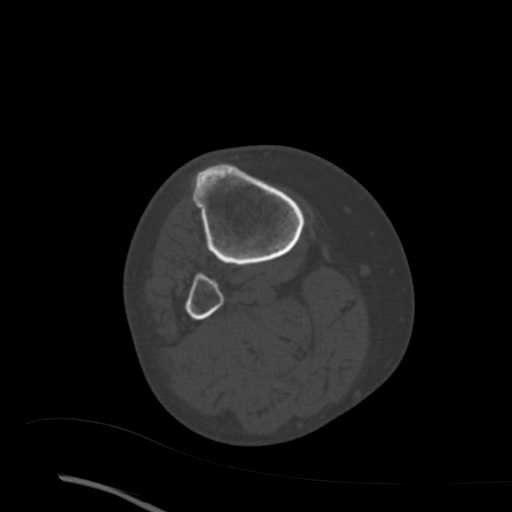
[im 62/135  bone]
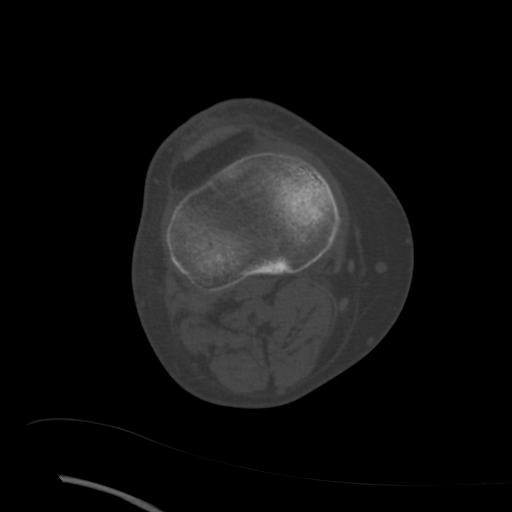
[im 73/135  soft-tissue]
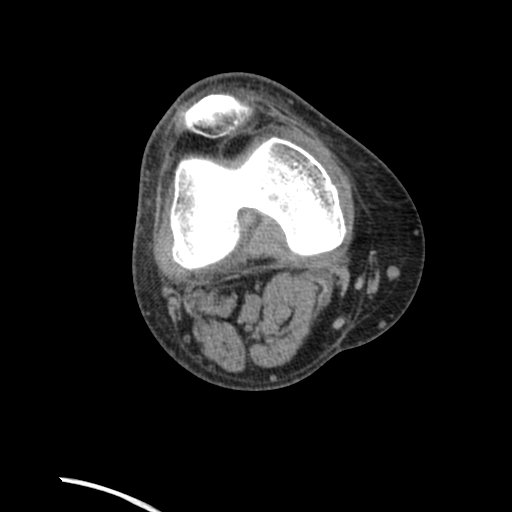
[im 73/135  bone]
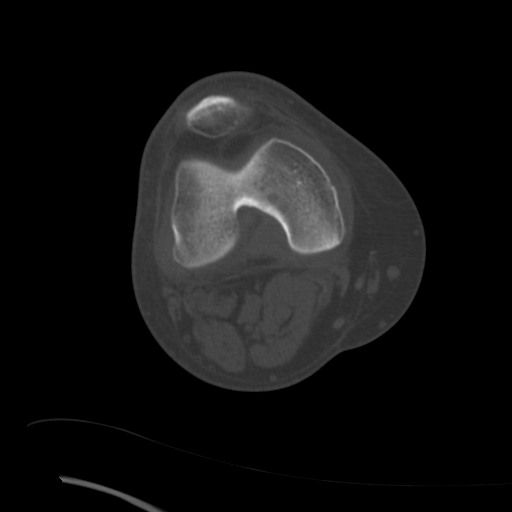
[im 93/135  bone]
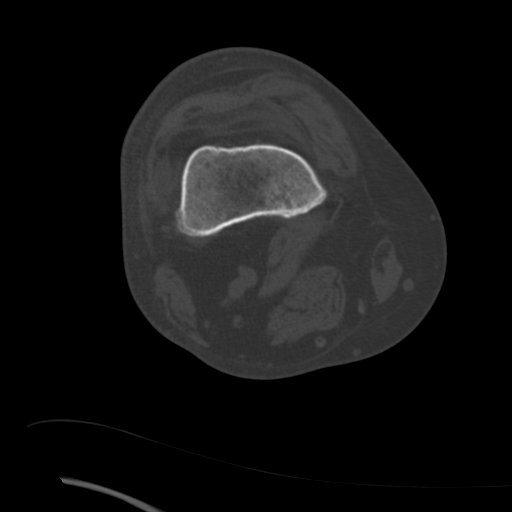
[im 104/135  bone]
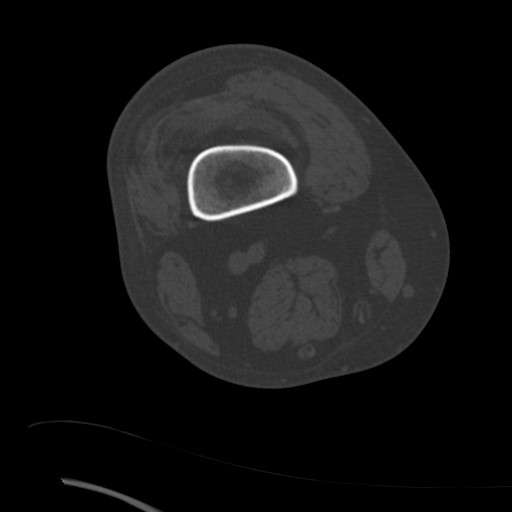
[im 124/135  bone]
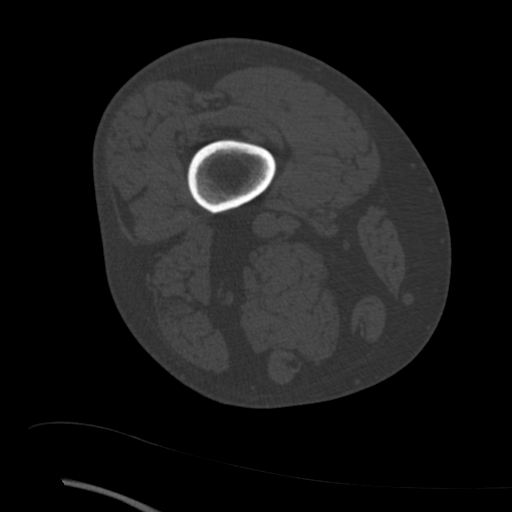

[Series 701: batch 2 · coronal · 0.50mm/px · 3 of 107 slices shown]
[im 22/107  bone]
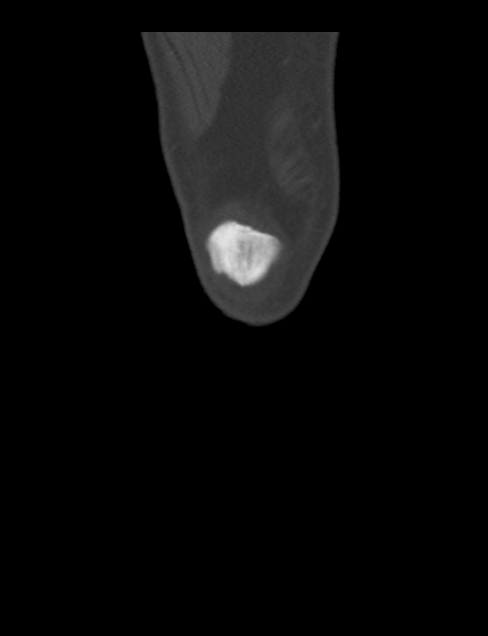
[im 43/107  bone]
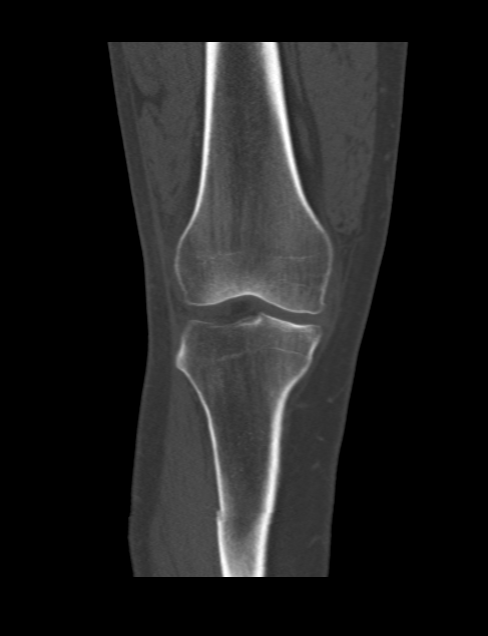
[im 64/107  bone]
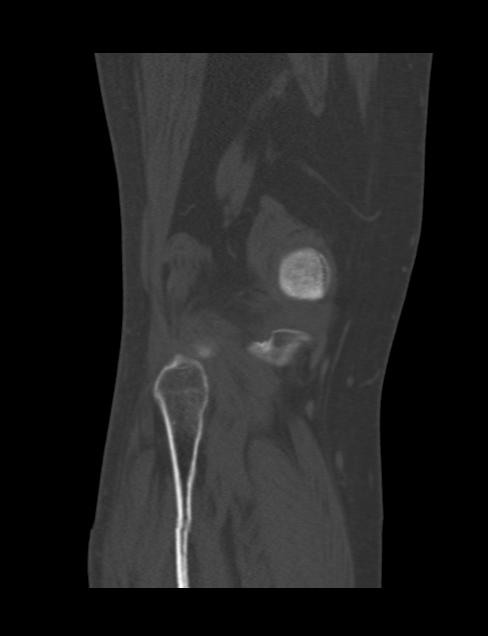

[Series 702: batch 3 · sagittal · 0.50mm/px · 5 of 90 slices shown, 6 images]
[im 30/90  bone]
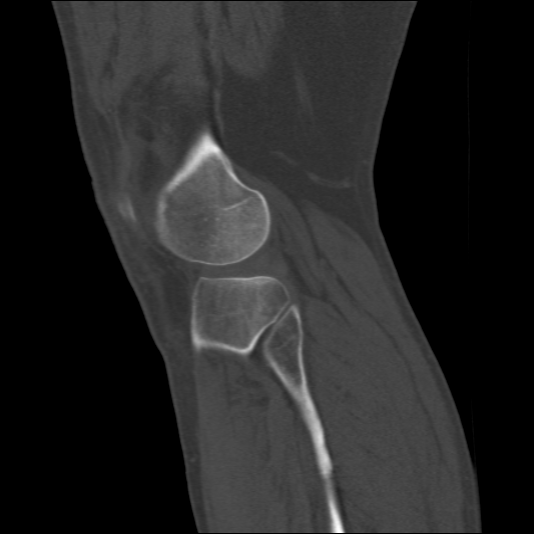
[im 38/90  bone]
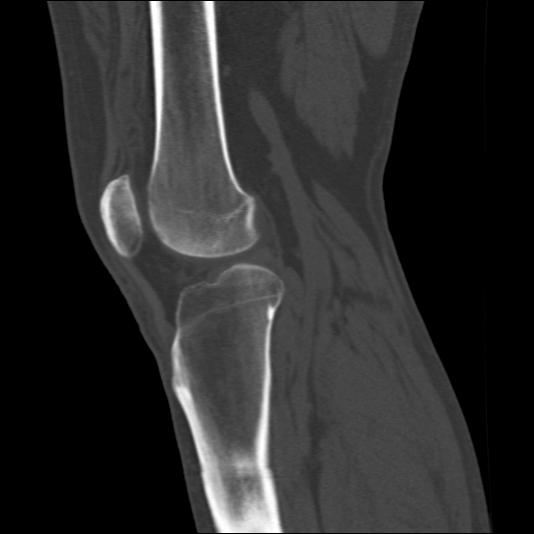
[im 45/90  soft-tissue]
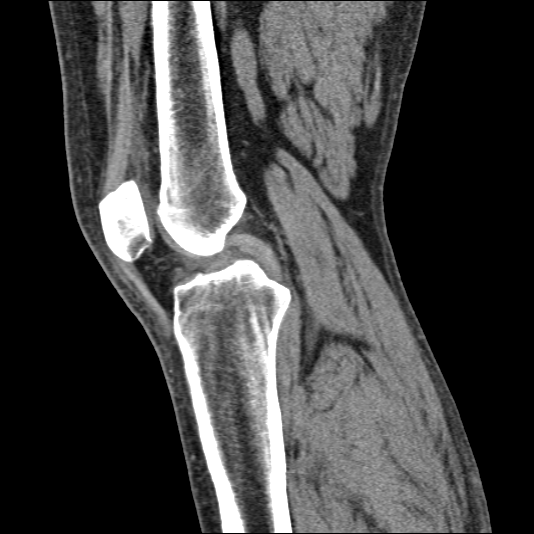
[im 45/90  bone]
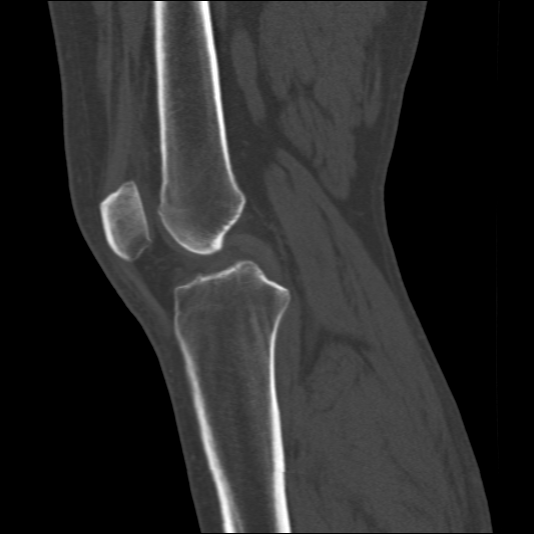
[im 52/90  bone]
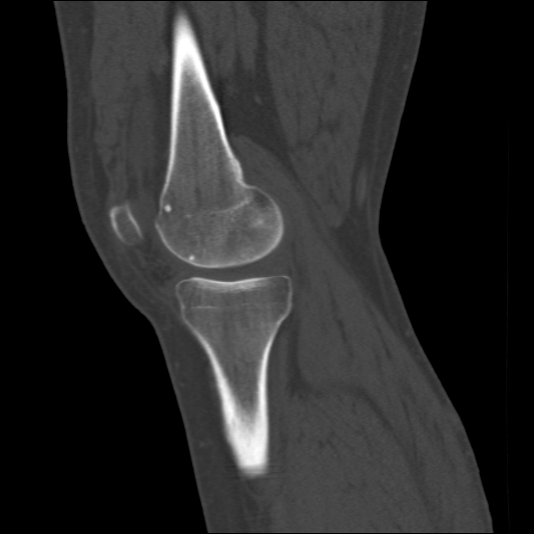
[im 60/90  bone]
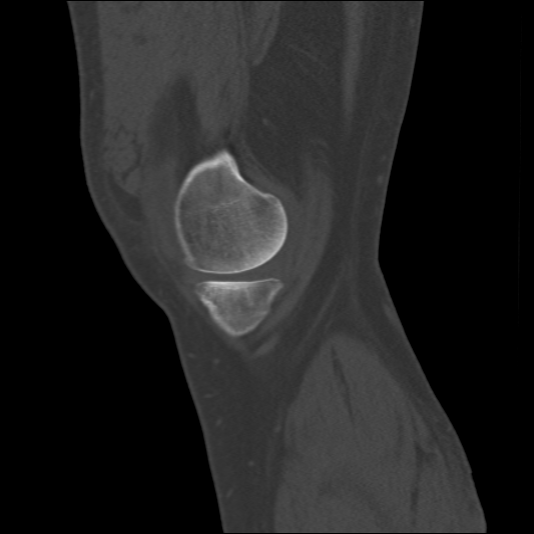

[16 of 33 positions shown; findings below may reference images not displayed]

FINDINGS: Motion artifact is present in the metadiaphysis of the
tibia.  Small knee effusion is present.  The alignment of the knee
is anatomic.  Fibula appears within normal limits.  Tibial plateau
intact.  Mild medial compartment osteoarthritis.
IMPRESSION: No acute osseous abnormality.

## 2014-01-05 IMAGING — CR DG THORACIC SPINE 2V
4 series · 4 of 4 positions shown · non-contrast
Comparison: Chest 05/29/2011

CLINICAL DATA: Syncope with loss of consciousness and fall.
Subsequent back pain radiating to the buttocks.

THORACIC SPINE - 2 VIEW

[t thoracic spine ap]
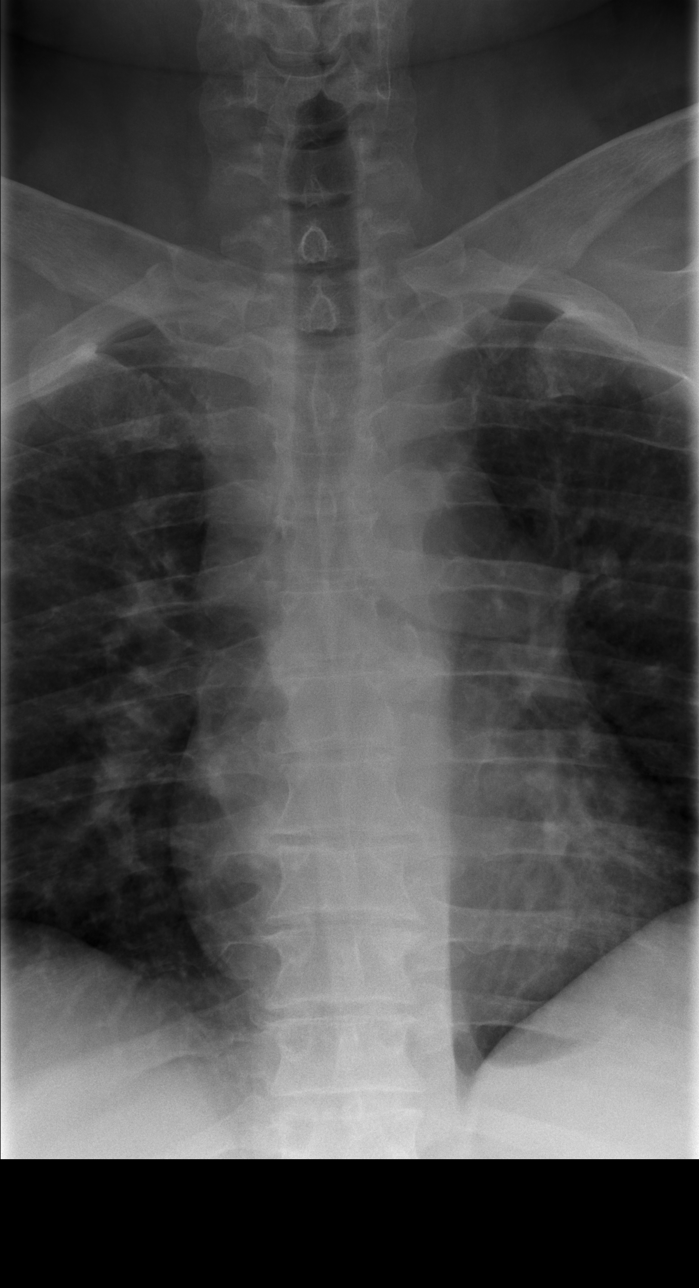

[t thoracic spine lat (1 of 2)]
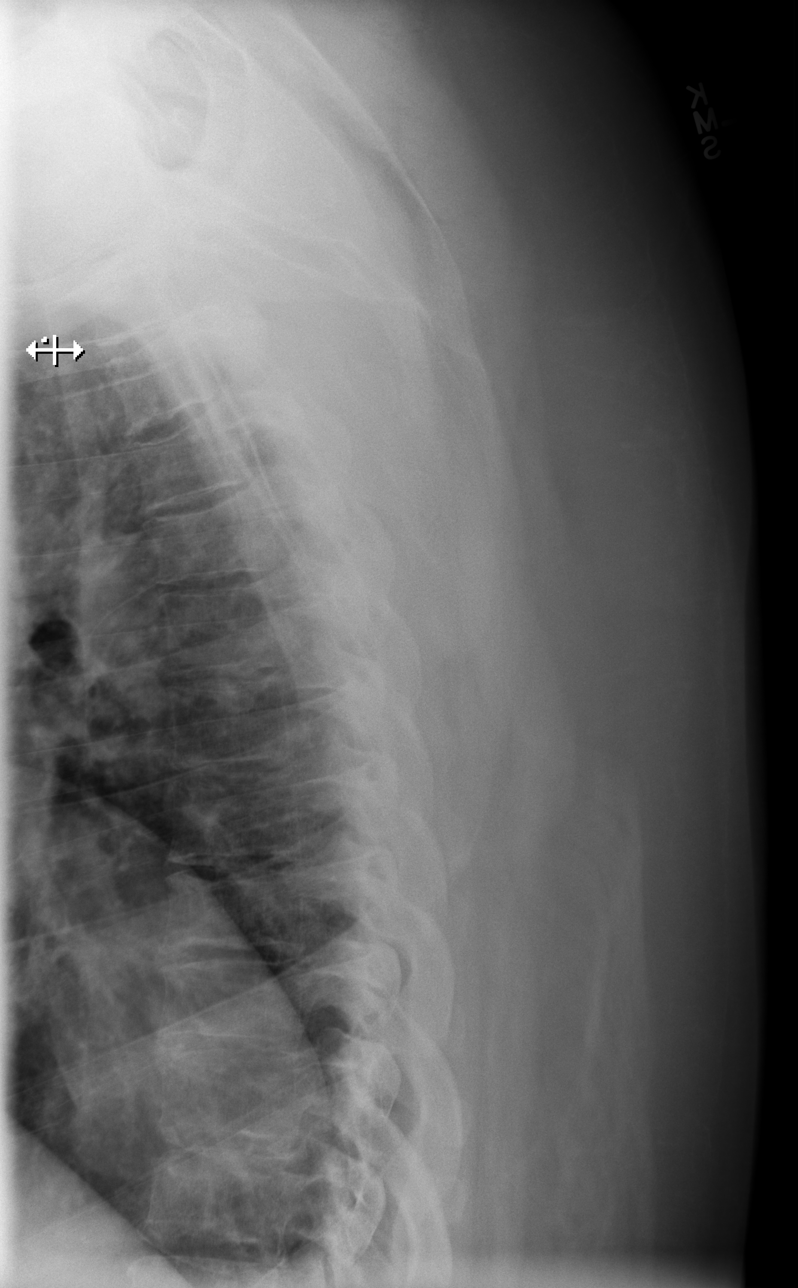

[t thoracic spine lat (2 of 2)]
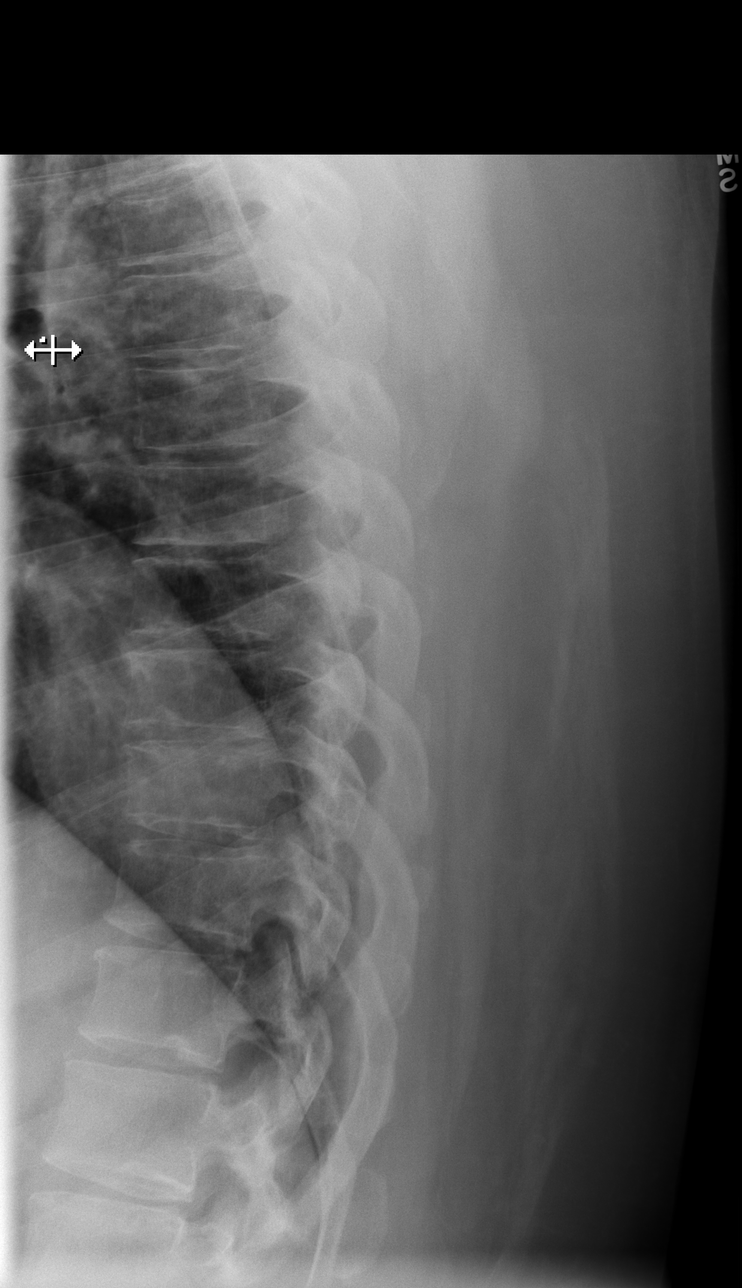

[t thoracic swimmers]
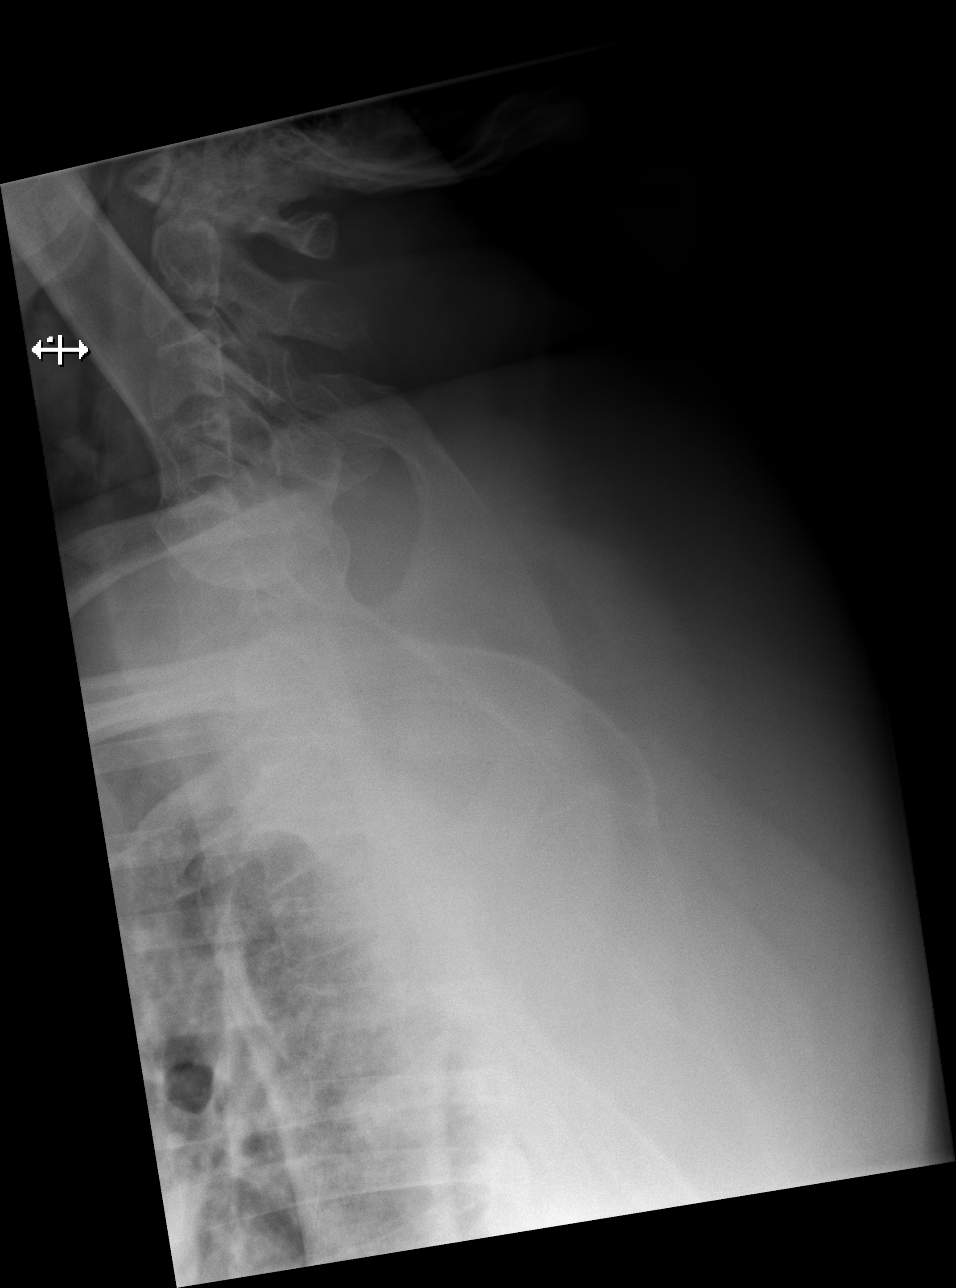

[4 of 4 positions shown; findings below may reference images not displayed]

FINDINGS: Normal alignment of the thoracic vertebrae.  Degenerative
changes throughout the thoracic spine with narrowed interspaces and
associated endplate hypertrophic changes.  No vertebral compression
deformities.  No paraspinal soft tissue swelling.  No focal bone
lesion or bone destruction.  Bone cortex and trabecular
architecture appear intact.
IMPRESSION: Degenerative changes in the thoracic spine.  No displaced fractures
identified.

## 2014-01-05 IMAGING — CT CT HEAD W/O CM
3 series · 18 of 30 positions shown, 20 images · non-contrast
Comparison: 05/29/2011

CLINICAL DATA: Headache, syncope, fall

CT HEAD WITHOUT CONTRAST
TECHNIQUE: Contiguous axial images were obtained from the base of
the skull through the vertex without contrast.

[Series 2: head w/o · axial · non-contrast · 0.51mm/px · z∈[-248,-148]mm · 6 of 30 slices shown, 8 images]
[im 5/30  brain]
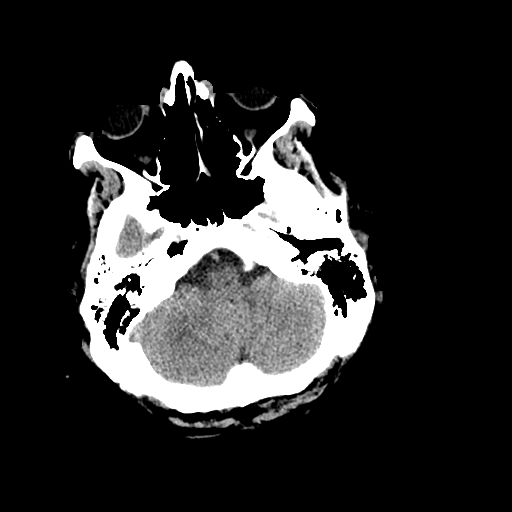
[im 5/30  bone]
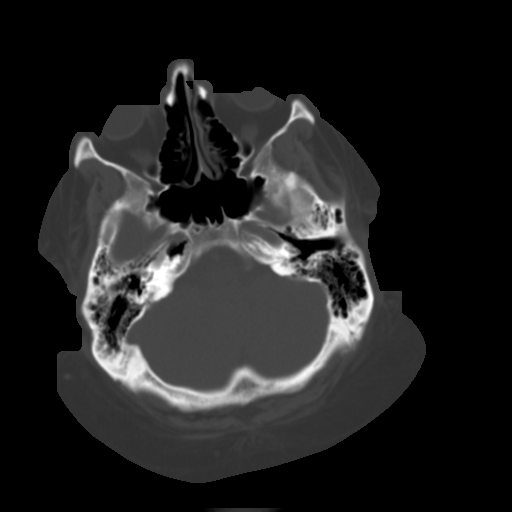
[im 9/30  brain]
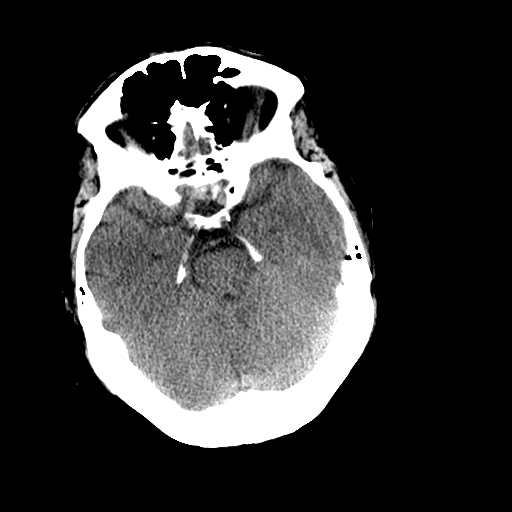
[im 13/30  brain]
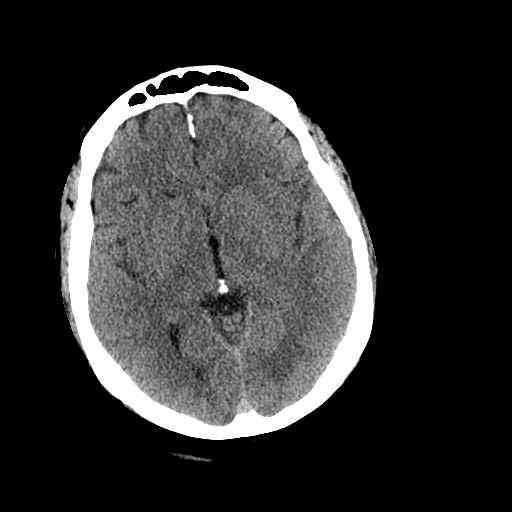
[im 17/30  brain]
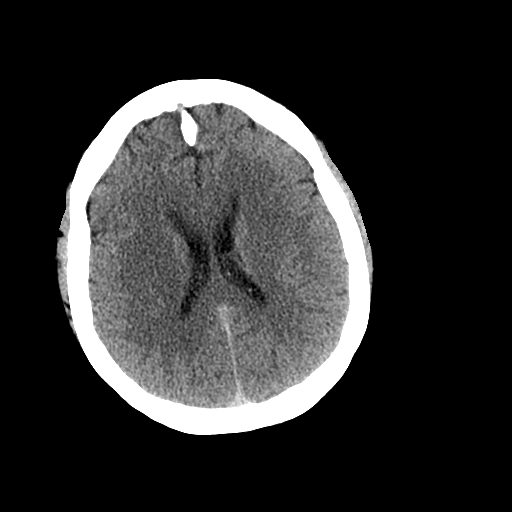
[im 21/30  brain]
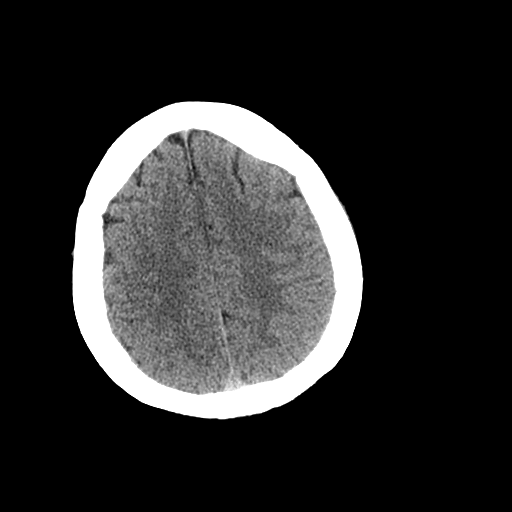
[im 21/30  bone]
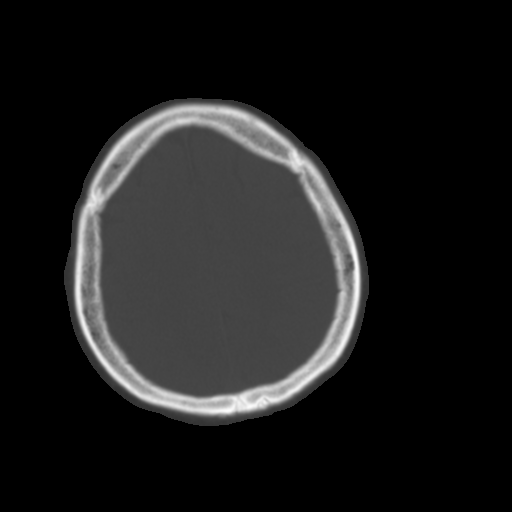
[im 25/30  brain]
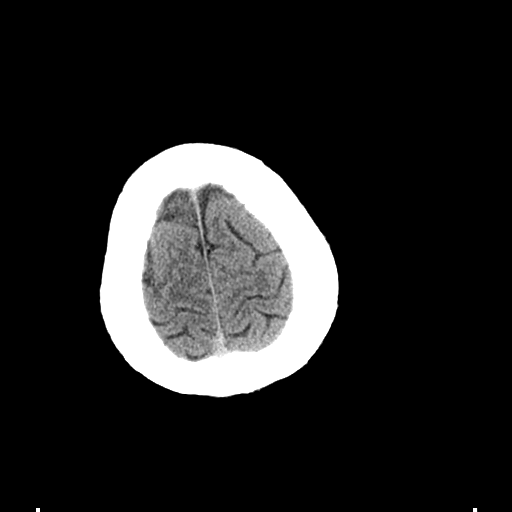

[Series 3: bone windows · axial · 0.51mm/px · z∈[-248,-188]mm · 4 of 30 slices shown (1 of 2)]
[im 5/30  bone]
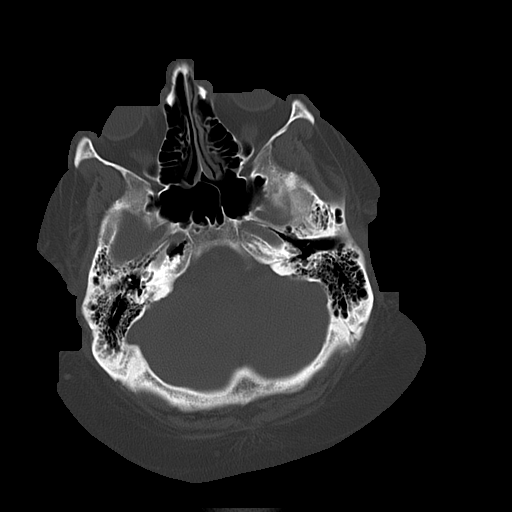
[im 9/30  bone]
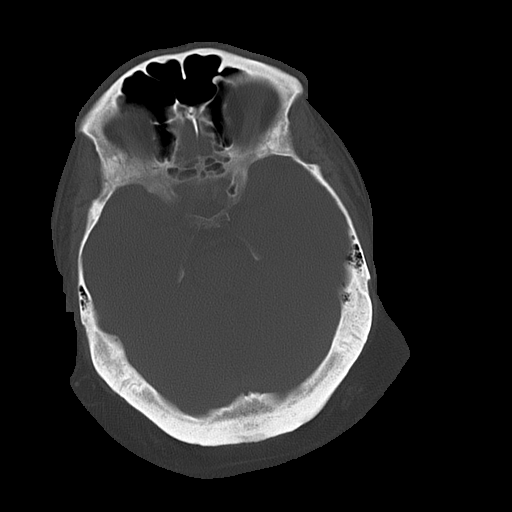
[im 13/30  bone]
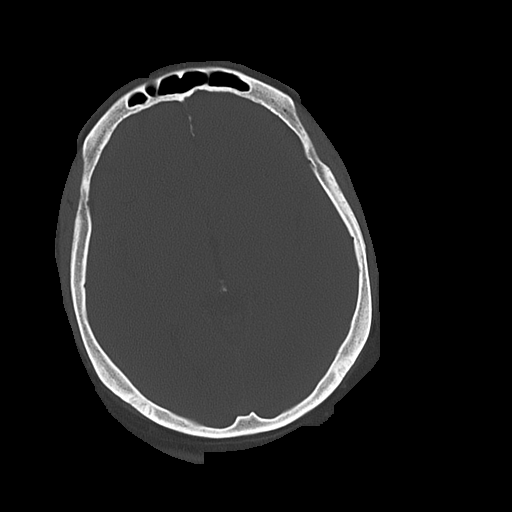
[im 17/30  bone]
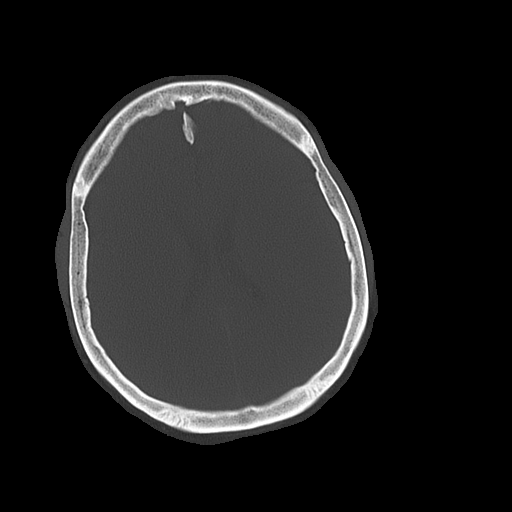

[Series 5: bone windows · axial · 0.51mm/px · z∈[-259,-133]mm · 8 of 50 slices shown (2 of 2)]
[im 4/50  bone]
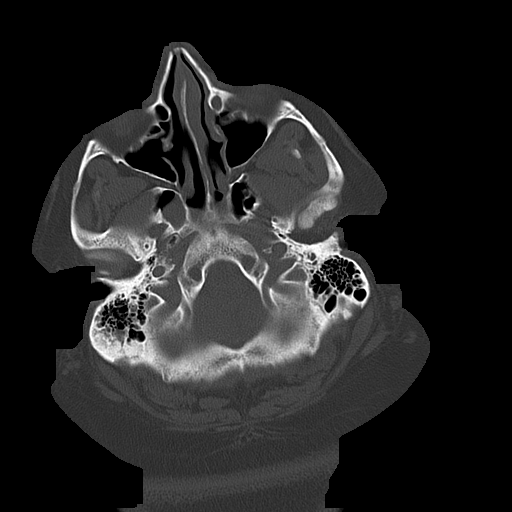
[im 12/50  bone]
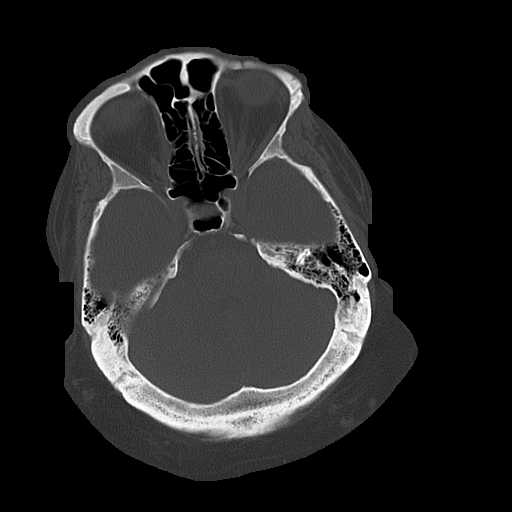
[im 16/50  bone]
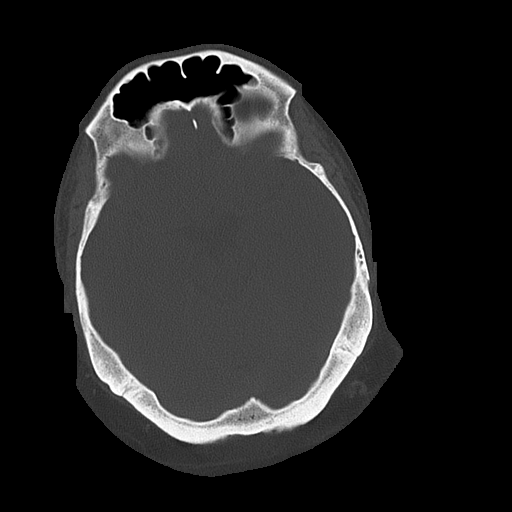
[im 23/50  bone]
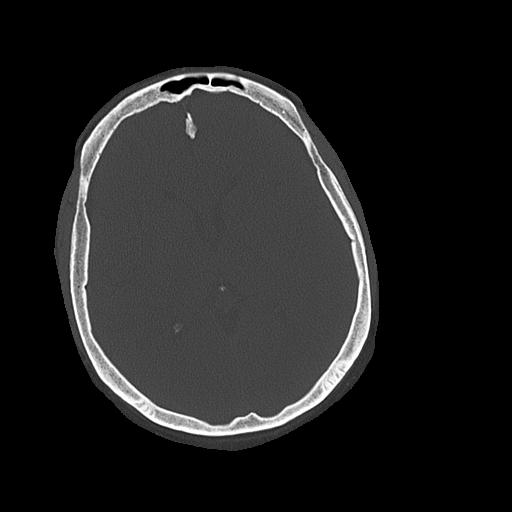
[im 27/50  bone]
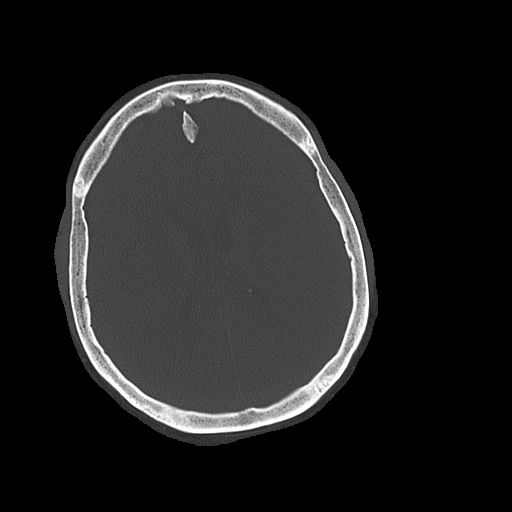
[im 34/50  bone]
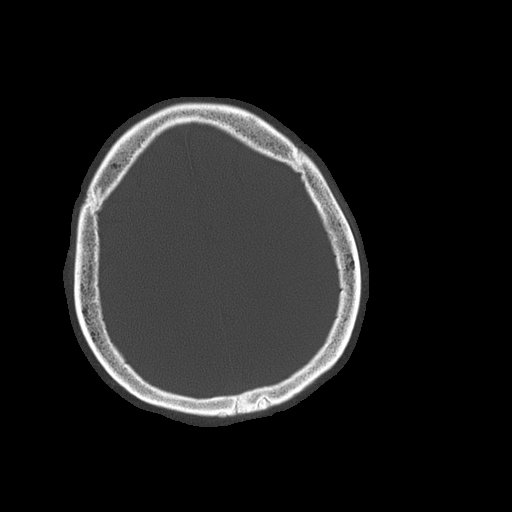
[im 38/50  bone]
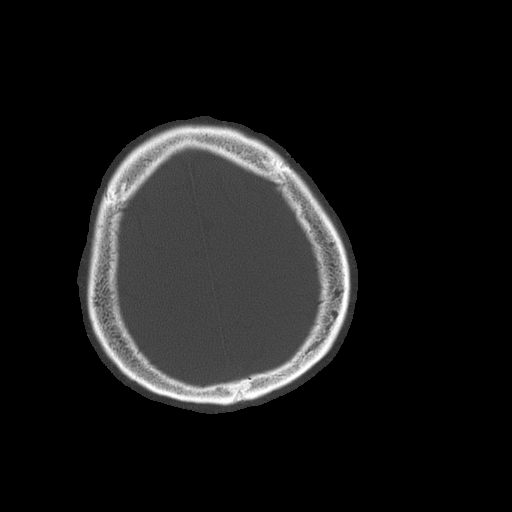
[im 46/50  bone]
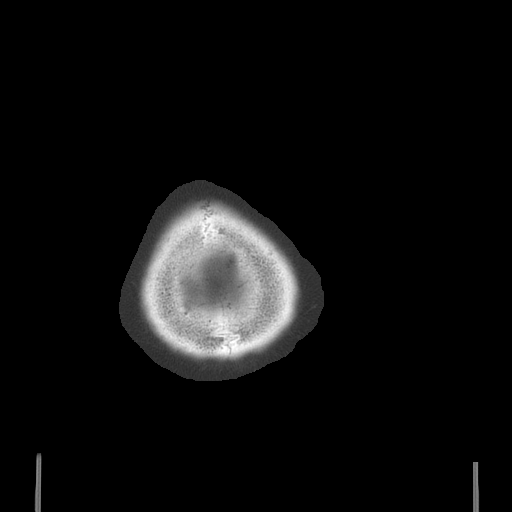

[18 of 30 positions shown; findings below may reference images not displayed]

FINDINGS: No evidence of parenchymal hemorrhage or extra-axial
fluid collection. No mass lesion, mass effect, or midline shift.

No CT evidence of acute infarction.

Cerebral volume is age appropriate.  No ventriculomegaly.

The visualized paranasal sinuses are essentially clear. The mastoid
air cells are unopacified.

No evidence of calvarial fracture.
IMPRESSION: Normal head CT.

## 2014-01-05 IMAGING — CR DG LUMBAR SPINE COMPLETE 4+V
6 series · 6 of 6 positions shown · non-contrast
Comparison: 02/19/2012

CLINICAL DATA: Syncope with fall and subsequent mid to lower back
pain radiating to the buttocks and right knee.

LUMBAR SPINE - COMPLETE 4+ VIEW

[t lumbar spine ap (1 of 4)]
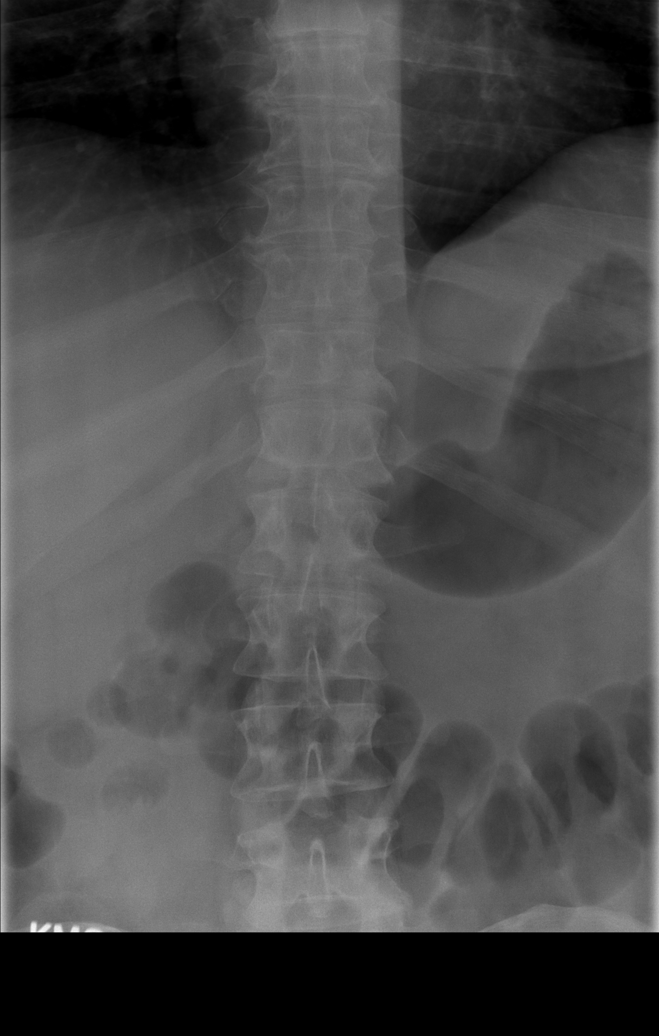

[t lumbar spine ap (2 of 4)]
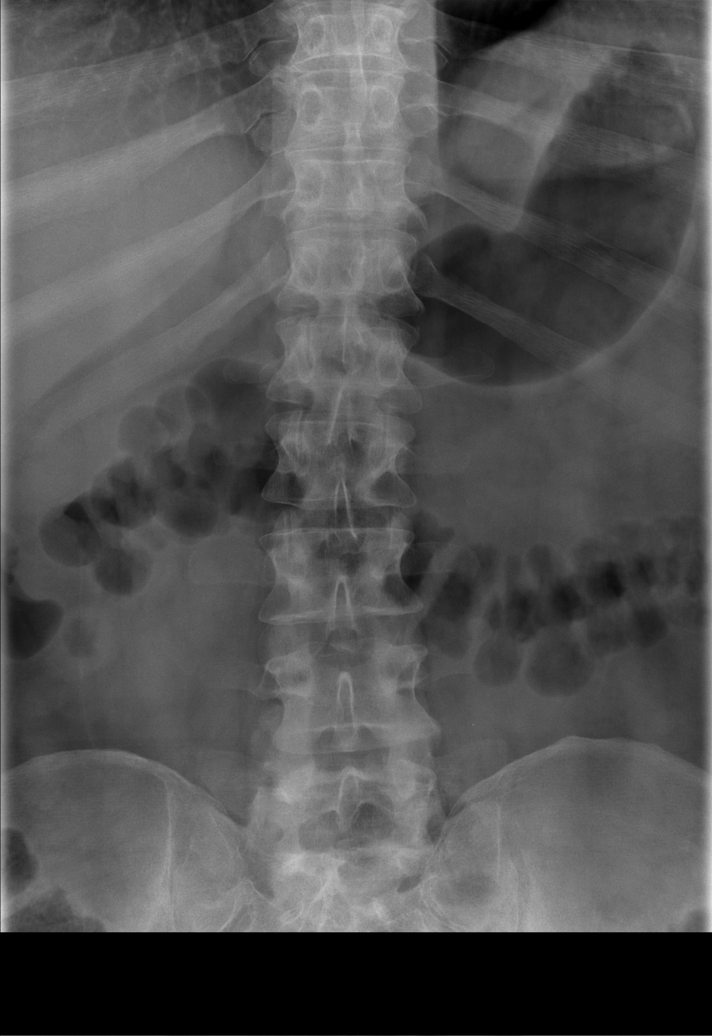

[t lumbar spine ap (3 of 4)]
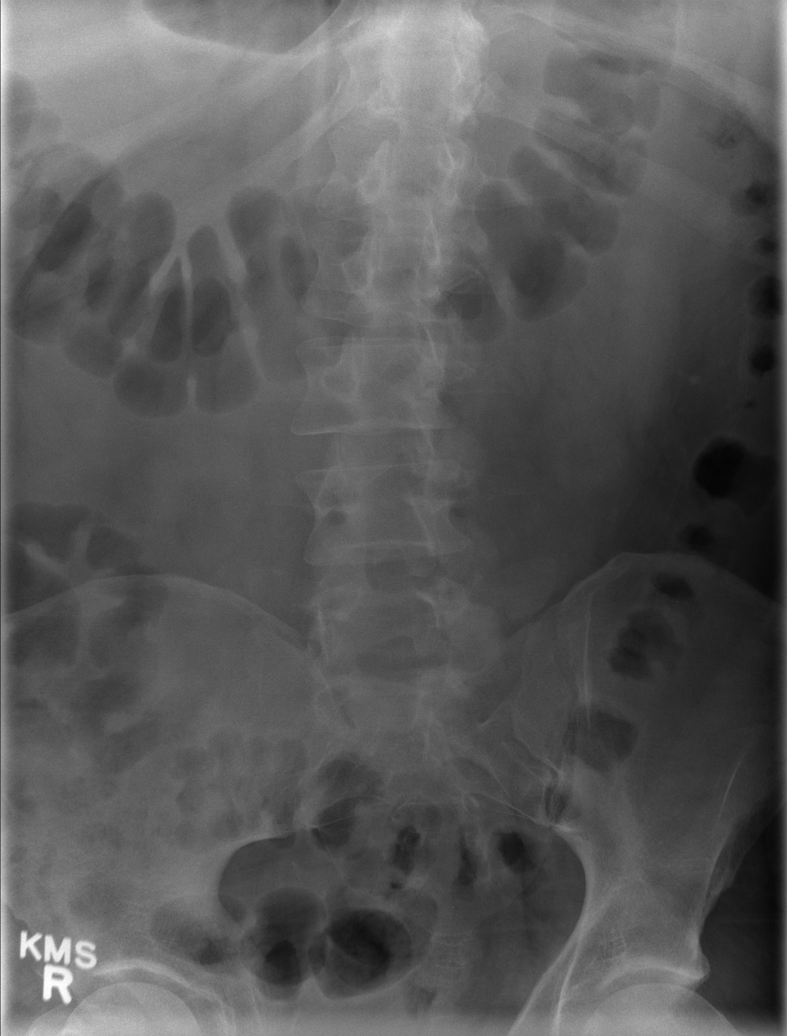

[t lumbar spine ap (4 of 4)]
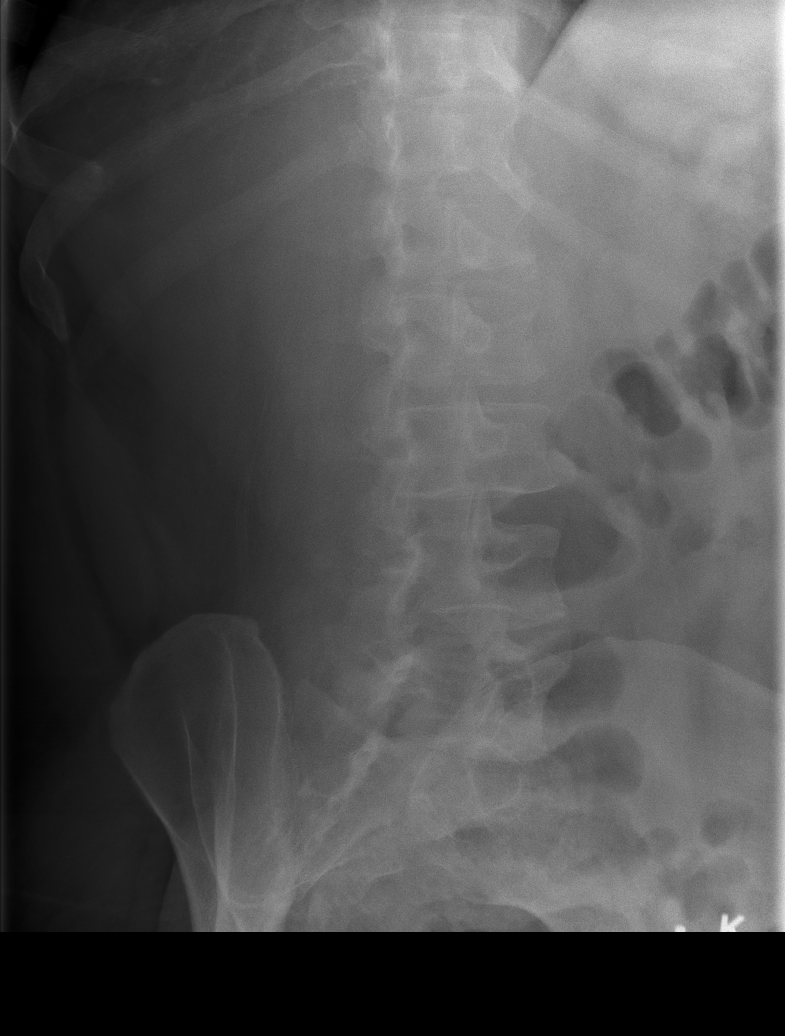

[t lumbar spine lat]
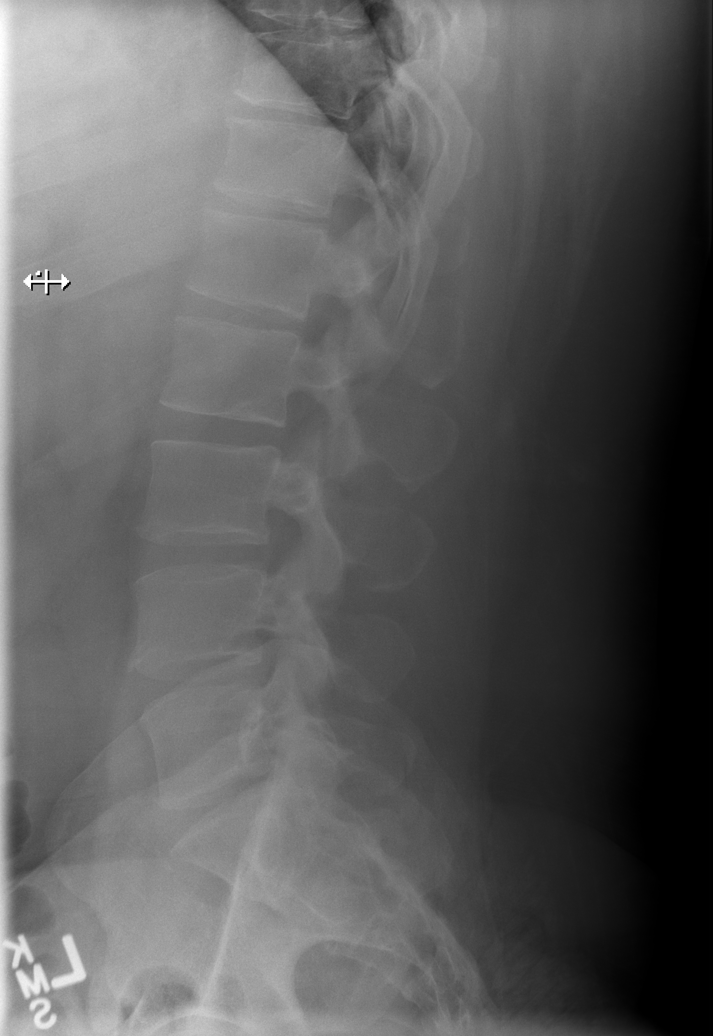

[t lumbar l-5 s-1 spot]
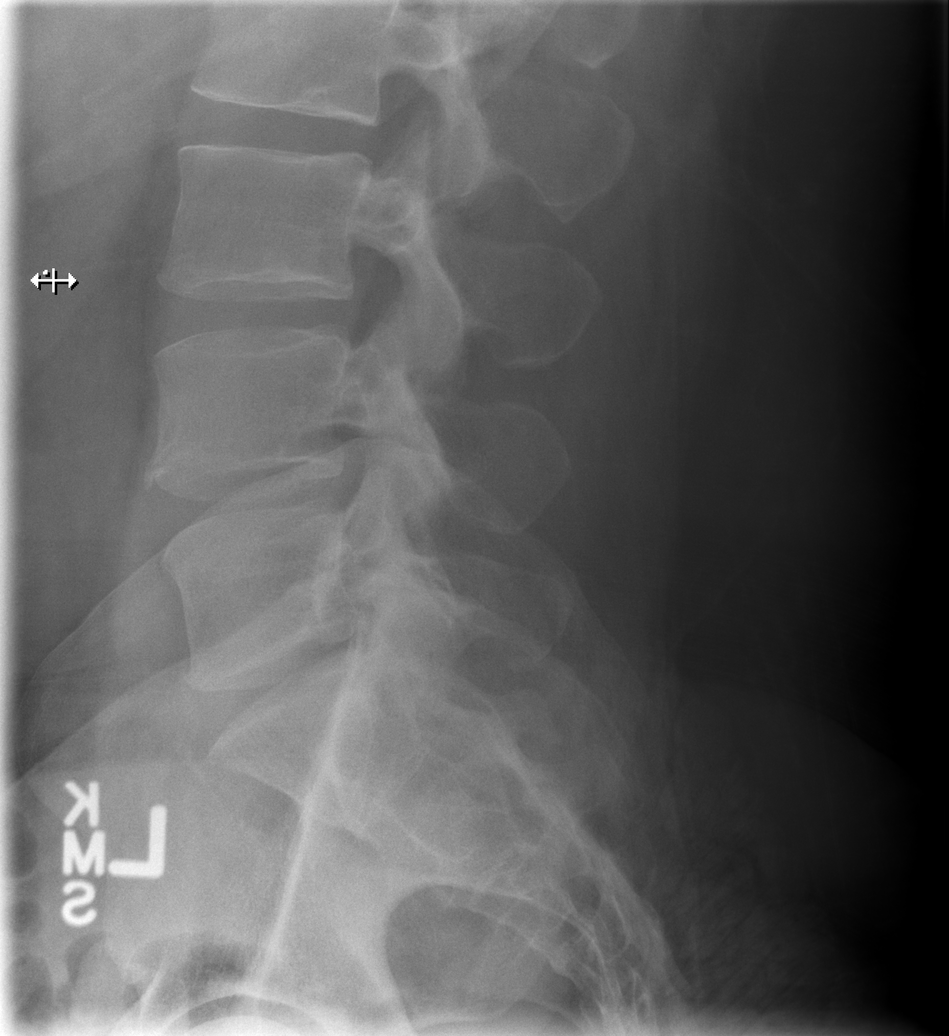

[6 of 6 positions shown; findings below may reference images not displayed]

FINDINGS: Five lumbar type vertebrae with partial sacralization of
L5.  Normal alignment of the lumbar vertebrae and facet joints.  No
vertebral compression deformities.  Intervertebral disc space
heights are preserved.  No focal bone lesion or bone destruction.
Stable appearance since previous study.
IMPRESSION: No displaced fractures identified.

## 2014-01-09 ENCOUNTER — Ambulatory Visit: Payer: Medicare Other | Admitting: Physician Assistant

## 2014-01-16 ENCOUNTER — Telehealth: Payer: Self-pay | Admitting: Physician Assistant

## 2014-01-16 ENCOUNTER — Encounter: Payer: Self-pay | Admitting: *Deleted

## 2014-01-16 ENCOUNTER — Encounter: Payer: Self-pay | Admitting: Physician Assistant

## 2014-01-16 ENCOUNTER — Ambulatory Visit (INDEPENDENT_AMBULATORY_CARE_PROVIDER_SITE_OTHER): Payer: Medicare Other | Admitting: Physician Assistant

## 2014-01-16 VITALS — BP 159/116 | HR 107 | Temp 98.3°F | Resp 20 | Ht 74.0 in | Wt >= 6400 oz

## 2014-01-16 DIAGNOSIS — I10 Essential (primary) hypertension: Secondary | ICD-10-CM

## 2014-01-16 DIAGNOSIS — M5126 Other intervertebral disc displacement, lumbar region: Secondary | ICD-10-CM

## 2014-01-16 DIAGNOSIS — F411 Generalized anxiety disorder: Secondary | ICD-10-CM

## 2014-01-16 DIAGNOSIS — E119 Type 2 diabetes mellitus without complications: Secondary | ICD-10-CM

## 2014-01-16 DIAGNOSIS — F3162 Bipolar disorder, current episode mixed, moderate: Secondary | ICD-10-CM

## 2014-01-16 DIAGNOSIS — G8929 Other chronic pain: Secondary | ICD-10-CM

## 2014-01-16 DIAGNOSIS — M549 Dorsalgia, unspecified: Secondary | ICD-10-CM

## 2014-01-16 DIAGNOSIS — L03119 Cellulitis of unspecified part of limb: Secondary | ICD-10-CM

## 2014-01-16 DIAGNOSIS — K2100 Gastro-esophageal reflux disease with esophagitis, without bleeding: Secondary | ICD-10-CM

## 2014-01-16 DIAGNOSIS — J4530 Mild persistent asthma, uncomplicated: Secondary | ICD-10-CM

## 2014-01-16 DIAGNOSIS — E785 Hyperlipidemia, unspecified: Secondary | ICD-10-CM

## 2014-01-16 MED ORDER — OXYCODONE HCL 30 MG PO TABS: 30.0000 mg | ORAL_TABLET | Freq: Four times a day (QID) | ORAL | Status: DC | PRN

## 2014-01-16 MED ORDER — PRAVASTATIN SODIUM 40 MG PO TABS
40.0000 mg | ORAL_TABLET | Freq: Every day | ORAL | Status: DC
Start: 2014-01-16 — End: 2014-03-06

## 2014-01-16 MED ORDER — OMEPRAZOLE 40 MG PO CPDR: 40.0000 mg | DELAYED_RELEASE_CAPSULE | Freq: Every day | ORAL | Status: DC

## 2014-01-16 MED ORDER — ALPRAZOLAM 1 MG PO TABS: 1.0000 mg | ORAL_TABLET | Freq: Three times a day (TID) | ORAL | Status: DC | PRN

## 2014-01-16 MED ORDER — ALBUTEROL SULFATE HFA 108 (90 BASE) MCG/ACT IN AERS: 2.0000 | INHALATION_SPRAY | Freq: Four times a day (QID) | RESPIRATORY_TRACT | Status: DC | PRN

## 2014-01-16 MED ORDER — DOXYCYCLINE HYCLATE 100 MG PO CAPS: 100.0000 mg | ORAL_CAPSULE | Freq: Two times a day (BID) | ORAL | Status: DC

## 2014-01-16 MED ORDER — ZOLPIDEM TARTRATE 10 MG PO TABS: 10.0000 mg | ORAL_TABLET | Freq: Every evening | ORAL | Status: DC | PRN

## 2014-01-16 MED ORDER — METFORMIN HCL 500 MG PO TABS: 500.0000 mg | ORAL_TABLET | Freq: Two times a day (BID) | ORAL | Status: DC

## 2014-01-16 MED ORDER — CITALOPRAM HYDROBROMIDE 10 MG PO TABS: 10.0000 mg | ORAL_TABLET | Freq: Every day | ORAL | Status: DC

## 2014-01-16 MED ORDER — CLONIDINE HCL 0.2 MG PO TABS
0.2000 mg | ORAL_TABLET | Freq: Every day | ORAL | Status: DC
Start: 2014-01-16 — End: 2014-03-06

## 2014-01-16 MED ORDER — FLUTICASONE-SALMETEROL 250-50 MCG/DOSE IN AEPB: 1.0000 | INHALATION_SPRAY | Freq: Two times a day (BID) | RESPIRATORY_TRACT | Status: DC

## 2014-01-16 MED ORDER — LISINOPRIL 20 MG PO TABS: 20.0000 mg | ORAL_TABLET | Freq: Every day | ORAL | Status: DC

## 2014-01-16 NOTE — Progress Notes (Signed)
Patient presents to clinic today to establish care.  Acute Concerns: Patient complains of a red and tender sore of the dorsal aspect of his left foot, that has been present for 2 weeks and is gradually worsening. Patient denies fever, chills or malaise. Denies drainage from site. Denies decreased range of motion of foot. Denies numbness of feet bilaterally. Patient with history of controlled type 2 diabetes. Denies history of known foot ulcer.  Chronic Issues: Diabetes Mellitus type II -- patient currently on metformin 500 mg twice daily. Endorses recent A1C at goal when checked by her previous primary medical doctor. Denies complications. Is on ACE inhibitor therapy for renovascular protection.    Anxiety and depression -- Patient currently on combination of Celexa 10 mg daily and Xanax 1 mg tablet 3 times daily. Denies panic attack. Denies depressed mood over the past few weeks. Denies suicidal thought or ideation. Previously on Ambien 10 mg to aid in sleep.  Essential hypertension -- patient currently on regimen of lisinopril 20 mg daily and Catapres 0.2 mg.  BP 159/116 in clinic.  Patient has not taken his medications today. Denies chest pain, palpitations, lightheadedness or dizziness.  GERD -- endorses well controlled with Prilosec 40 mg daily. Denies breakthrough reflux, nausea, vomiting or abdominal pain.  Morbid obesity -- Body mass index is 55.31 kg/(m^2). patient currently on Adderall to help with focus and decreased appetite. Patient unable to exercise due to severe degenerative disc disease. Patient also with asthma secondary to morbid obesity. Currently on Advair daily. Has albuterol inhaler for when necessary usage.  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 Patient also previously on Opana 20 mg extended release twice daily. Patient endorses good relief with  these measures. Patient is aware that we are not a pain management clinic.  Obstructive sleep apnea -- previously on CPAP. Has been without his supplies for several months. Just received supplies in the mail yesterday evening. Requesting referral to new pulmonologist in our area.  Hyperlipidemia -- patient currently on pravastatin 40 mg daily. Has tried to watch his diet.  History of TIA -- couple of years ago. Thought to be secondary to extremely elevated hypertension at that time. Patient is on statin therapy and also takes a 325 mg aspirin daily.   Tobacco abuse disorder -- patient currently smoking half pack per day. Has significant pack year history. Patient also with asthma, OSA, hypertension, hyperlipidemia and history of TIA.  Is trying to quit on his home. Refuses medical intervention at present.   Past Medical History  Diagnosis Date  . Heart valve disorder     s/p echocardiogram  . Hypertension   . Asthma   . Diabetes mellitus   . Hyperlipidemia   . Anaphylactic reaction   . Bipolar disorder   . Adult ADHD   . Morbidly obese   . Transient cerebral ischemia     Unknown  . OSA (obstructive sleep apnea)   . Anxiety     Past Surgical History  Procedure Laterality Date  . Carpal tunnel release    . Nose surgery    . Wisdom tooth extraction    . Tooth extraction      Current Outpatient Prescriptions on File Prior to Visit  Medication Sig Dispense Refill  . aspirin 325 MG tablet Take 1 tablet (325 mg total) by mouth daily.     No current facility-administered  medications on file prior to visit.    Allergies  Allergen Reactions  . Atenolol Anaphylaxis  . Other Anaphylaxis    "Prednisone like drugs"  . Tylenol [Acetaminophen] Other (See Comments)    unknown    Family History  Problem Relation Age of Onset  . CAD Mother     Living  . Diabetes Mellitus II Mother   . Stroke Mother   . Hypertension Mother   . Congestive Heart Failure Mother   . Kidney  disease Mother   . Fibromyalgia Mother   . Thyroid disease Mother   . Hyperlipidemia Mother   . Liver disease Mother   . Alcoholism Father [id number]    Deceased  . Arthritis Maternal Grandmother   . Congestive Heart Failure Maternal Grandmother   . Hypertension Maternal Grandmother   . Lung cancer Maternal Grandfather   . Colon cancer Maternal Aunt   . Stomach cancer Maternal Aunt   . Heart disease Other     Paternal & Maternal  . Hypertension Other     Paternal & Maternal  . Hypertension Brother     x3  . Hypertension Sister     #1  . Bipolar disorder Sister     #1  . ADD / ADHD Son     x3  . Bipolar disorder Son     x3  . Asperger's syndrome Son     History   Social History  . Marital Status: Single    Spouse Name: N/A    Number of Children: N/A  . Years of Education: N/A   Occupational History  . Not on file.   Social History Main Topics  . Smoking status: Current Every Day Smoker -- 1.00 packs/day for 23 years    Types: Cigarettes  . Smokeless tobacco: Never Used  . Alcohol Use: 0.0 oz/week    0 Not specified per week     Comment: rare  . Drug Use: No  . Sexual Activity:    Partners: Female   Other Topics Concern  . Not on file   Social History Narrative   ROS See HPI.  All other ROS are negative.  BP 159/116 mmHg  Pulse 107  Temp(Src) 98.3 F (36.8 C) (Oral)  Resp 20  Ht 6\' 2"  (1.88 m)  Wt 431 lb (195.5 kg)  BMI 55.31 kg/m2  SpO2 96%  Physical Exam  Constitutional: He is oriented to person, place, and time.  Morbidly obese caucasian gentleman in no acute distress.  HENT:  Head: Normocephalic and atraumatic.  Right Ear: External ear normal.  Left Ear: External ear normal.  Nose: Nose normal.  Mouth/Throat: Oropharynx is clear and moist. No oropharyngeal exudate.  Eyes: Conjunctivae are normal. Pupils are equal, round, and reactive to light.  Neck: Neck supple. No thyromegaly present.  Cardiovascular: Normal rate, regular rhythm, normal  heart sounds and intact distal pulses.   Pulmonary/Chest: Effort normal and breath sounds normal. No respiratory distress. He has no wheezes. He has no rales. He exhibits no tenderness.  Musculoskeletal:       Feet:  Neurological: He is alert and oriented to person, place, and time.  Skin: Skin is warm and dry. No rash noted.  Psychiatric: Affect normal.  Vitals reviewed.  Assessment/Plan: Chronic back pain greater than 3 months duration Medications refilled.  CSC signed.  UDS obtained.  Will refer to pain management as we will not prescribe these medications chronically.  Hyperlipidemia with target LDL less than 100 Medications refilled.  Patient to return for CPE with fasting labs.  Cellulitis of foot Diabetic patient.  No ulceration.  Rx doxycycline.  Supportive measures discussed.  Follow-up 1 week.  Bipolar 1 disorder, mixed, moderate Stable. Medications refilled.  Patient aware that 5 times daily dosing of Xanax will not be given.  Essential hypertension, benign BP elevated.  Medications to be restarted.  Refills sent to pharmacy.  DASH diet discussed.  Handout given. Follow-up in 1 week.  Asthma, mild persistent Continue current measures.  Referral to Pulmonology placed.  Morbid obesity Body mass index is 55.31 kg/(m^2).  Patient with multiple comorbidities as a result of obesity.  Discussed healthy diet and exercise.  Handout given.

## 2014-01-16 NOTE — Telephone Encounter (Signed)
Ambien will be faxed to pharmacy.  The Opana is not a medication prescribed by our office.  I am willing to refill x 1 until he sees pain management, but will need his UDS results first.

## 2014-01-16 NOTE — Telephone Encounter (Signed)
Pt notified  Pt verbalized understanding

## 2014-01-16 NOTE — Patient Instructions (Signed)
Please resume medications as directed.  We need to get your BP back down before we restart the Adderall given to you by your previous medical provider.  Take the Doxycyline as directed for the cellulitis of your left foot.  If symptoms worsen while on antibiotic, I want you to go to the ER for IV antibiotics giving your history of diabetes and concern for worsening infection.    You will be contacted by Pain Management and Pul;monology.  Please go to these appointments.  We will not prescribe pain medications chronically at this practice.  Follow-up in 1 week.  Make sure to take BP medications before visit.  DASH Eating Plan DASH stands for "Dietary Approaches to Stop Hypertension." The DASH eating plan is a healthy eating plan that has been shown to reduce high blood pressure (hypertension). Additional health benefits may include reducing the risk of type 2 diabetes mellitus, heart disease, and stroke. The DASH eating plan may also help with weight loss. WHAT DO I NEED TO KNOW ABOUT THE DASH EATING PLAN? For the DASH eating plan, you will follow these general guidelines:  Choose foods with a percent daily value for sodium of less than 5% (as listed on the food label).  Use salt-free seasonings or herbs instead of table salt or sea salt.  Check with your health care provider or pharmacist before using salt substitutes.  Eat lower-sodium products, often labeled as "lower sodium" or "no salt added."  Eat fresh foods.  Eat more vegetables, fruits, and low-fat dairy products.  Choose whole grains. Look for the word "whole" as the first word in the ingredient list.  Choose fish and skinless chicken or [place]key more often than red meat. Limit fish, poultry, and meat to 6 oz (170 g) each day.  Limit sweets, desserts, sugars, and sugary drinks.  Choose heart-healthy fats.  Limit cheese to 1 oz (28 g) per day.  Eat more home-cooked food and less restaurant, buffet, and fast food.  Limit fried  foods.  Cook foods using methods other than frying.  Limit canned vegetables. If you do use them, rinse them well to decrease the sodium.  When eating at a restaurant, ask that your food be prepared with less salt, or no salt if possible. WHAT FOODS CAN I EAT? Seek help from a dietitian for individual calorie needs. Grains Whole grain or whole wheat bread. Brown rice. Whole grain or whole wheat pasta. Quinoa, bulgur, and whole grain cereals. Low-sodium cereals. Corn or whole wheat flour tortillas. Whole grain cornbread. Whole grain crackers. Low-sodium crackers. Vegetables Fresh or frozen vegetables (raw, steamed, roasted, or grilled). Low-sodium or reduced-sodium tomato and vegetable juices. Low-sodium or reduced-sodium tomato sauce and paste. Low-sodium or reduced-sodium canned vegetables.  Fruits All fresh, canned (in natural juice), or frozen fruits. Meat and Other Protein Products Ground beef (85% or leaner), grass-fed beef, or beef trimmed of fat. Skinless chicken or [place]key. Ground chicken or [place]key. Pork trimmed of fat. All fish and seafood. Eggs. Dried beans, peas, or lentils. Unsalted nuts and seeds. Unsalted canned beans. Dairy Low-fat dairy products, such as skim or 1% milk, 2% or reduced-fat cheeses, low-fat ricotta or cottage cheese, or plain low-fat yogurt. Low-sodium or reduced-sodium cheeses. Fats and Oils Tub margarines without trans fats. Light or reduced-fat mayonnaise and salad dressings (reduced sodium). Avocado. Safflower, olive, or canola oils. Natural peanut or almond butter. Other Unsalted popcorn and pretzels. The items listed above may not be a complete list of recommended foods or beverages.  Contact your dietitian for more options. WHAT FOODS ARE NOT RECOMMENDED? Grains White bread. White pasta. White rice. Refined cornbread. Bagels and croissants. Crackers that contain trans fat. Vegetables Creamed or fried vegetables. Vegetables in a cheese sauce. Regular  canned vegetables. Regular canned tomato sauce and paste. Regular tomato and vegetable juices. Fruits Dried fruits. Canned fruit in light or heavy syrup. Fruit juice. Meat and Other Protein Products Fatty cuts of meat. Ribs, chicken wings, bacon, sausage, bologna, salami, chitterlings, fatback, hot dogs, bratwurst, and packaged luncheon meats. Salted nuts and seeds. Canned beans with salt. Dairy Whole or 2% milk, cream, half-and-half, and cream cheese. Whole-fat or sweetened yogurt. Full-fat cheeses or blue cheese. Nondairy creamers and whipped toppings. Processed cheese, cheese spreads, or cheese curds. Condiments Onion and garlic salt, seasoned salt, table salt, and sea salt. Canned and packaged gravies. Worcestershire sauce. Tartar sauce. Barbecue sauce. Teriyaki sauce. Soy sauce, including reduced sodium. Steak sauce. Fish sauce. Oyster sauce. Cocktail sauce. Horseradish. Ketchup and mustard. Meat flavorings and tenderizers. Bouillon cubes. Hot sauce. Tabasco sauce. Marinades. Taco seasonings. Relishes. Fats and Oils Butter, stick margarine, lard, shortening, ghee, and bacon fat. Coconut, palm kernel, or palm oils. Regular salad dressings. Other Pickles and olives. Salted popcorn and pretzels. The items listed above may not be a complete list of foods and beverages to avoid. Contact your dietitian for more information. WHERE CAN I FIND MORE INFORMATION? National Heart, Lung, and Blood Institute: CableProm[employer].itwww.nhlbi.nih.gov/health/health-topics/topics/dash/ Document Released: 02/04/2011 Document Revised: 07/02/2013 Document Reviewed: 12/20/2012 [facility] Patient Information 2015 [place], [place]. This information is not intended to replace advice given to you by your health care provider. Make sure you discuss any questions you have with your health care provider.

## 2014-01-16 NOTE — Telephone Encounter (Signed)
ptr was seen today, and states he has all of his rx except for oxymorphone (OPANA ER) 20 MG  And his zolpidem (AMBIEN) 10 MG tablet  Would like to know if he is going to call those in for him.

## 2014-01-17 ENCOUNTER — Telehealth: Payer: Self-pay | Admitting: Physician Assistant

## 2014-01-17 NOTE — Telephone Encounter (Signed)
emmi mailed  °

## 2014-01-21 NOTE — Telephone Encounter (Signed)
Patient wife, [name] calling in requesting UDS results

## 2014-01-21 NOTE — Telephone Encounter (Signed)
I am not in office today.  Will call patient once I have received and reviewed results.  Usually takes a few days after collected.

## 2014-01-22 ENCOUNTER — Telehealth: Payer: Self-pay | Admitting: Physician Assistant

## 2014-01-22 ENCOUNTER — Telehealth: Payer: Self-pay

## 2014-01-22 MED ORDER — OXYMORPHONE HCL ER 20 MG PO TB12: 20.0000 mg | ORAL_TABLET | Freq: Two times a day (BID) | ORAL | Status: DC

## 2014-01-22 NOTE — Telephone Encounter (Signed)
Bryan Gallegos Naser [phone] [facility]  [name] called to say that his insurance will not cover oxymorphone (OPANA ER) 20 MG 12 hr tablet, he needs the Oxycodine to be wrote for some 15 mg and 30 mg to alternate so insurance will pay.

## 2014-01-22 NOTE — Telephone Encounter (Signed)
Patient notified of results and Rx. Reinforced that pain management would need to be taking care of Rx when he is established. Patient agrees with plan.

## 2014-01-22 NOTE — Telephone Encounter (Signed)
Called and notified pt wife.

## 2014-01-22 NOTE — Telephone Encounter (Signed)
Medication that was refilled is what the patient said he was taking previously.  If his insurance covers 15 mg ER twice daily I am willing to write for this.  Will not increase to 30 as patient is already on Oxycodone as well.  Patient must bring in old Rx before new one will be given.

## 2014-01-22 NOTE — Telephone Encounter (Signed)
UDS came back. Patient passed UDS. Can have one month supply of Opana. Rx printed, signed and placed up front for pickup. Reiterate to patient and wife that he will not be receiving this medication from us chronically.  He should be contacted by pain management soon.

## 2014-01-23 ENCOUNTER — Ambulatory Visit: Payer: Medicare Other | Admitting: Physician Assistant

## 2014-01-23 ENCOUNTER — Telehealth: Payer: Self-pay | Admitting: *Deleted

## 2014-01-23 NOTE — Telephone Encounter (Signed)
Patient already receiving IR oxycodone.  Will not give IR Opana as well. I can help him find an alternative ER medication for pain that insurance will cover. Would have discussed this with patient today at visit but he No-Showed for his appointment.  Let me know if he is willing to let me find alternative.

## 2014-01-23 NOTE — Telephone Encounter (Signed)
Pt did not show for appointment 01/23/2014 at 10:00am for 1 week follow up

## 2014-01-23 NOTE — Telephone Encounter (Signed)
Patient Unavailable; Left Message for patient to return call RE: medication management and further provider instructions/SLS

## 2014-01-23 NOTE — Telephone Encounter (Signed)
Called and spoke with the pt and informed him of the note below.  Pt stated that the pharmacy informed him that the insurance will cover the Oxymorphone (Opana IR) but not the ER.  Please advise./AB/CMA

## 2014-01-28 DIAGNOSIS — E119 Type 2 diabetes mellitus without complications: Secondary | ICD-10-CM | POA: Insufficient documentation

## 2014-01-28 DIAGNOSIS — G8929 Other chronic pain: Secondary | ICD-10-CM | POA: Insufficient documentation

## 2014-01-28 DIAGNOSIS — E785 Hyperlipidemia, unspecified: Secondary | ICD-10-CM | POA: Insufficient documentation

## 2014-01-28 DIAGNOSIS — M549 Dorsalgia, unspecified: Secondary | ICD-10-CM

## 2014-01-28 DIAGNOSIS — F3162 Bipolar disorder, current episode mixed, moderate: Secondary | ICD-10-CM | POA: Insufficient documentation

## 2014-01-28 DIAGNOSIS — K2100 Gastro-esophageal reflux disease with esophagitis, without bleeding: Secondary | ICD-10-CM | POA: Insufficient documentation

## 2014-01-28 DIAGNOSIS — I10 Essential (primary) hypertension: Secondary | ICD-10-CM | POA: Insufficient documentation

## 2014-01-28 DIAGNOSIS — L03115 Cellulitis of right lower limb: Secondary | ICD-10-CM | POA: Insufficient documentation

## 2014-01-28 DIAGNOSIS — J45901 Unspecified asthma with (acute) exacerbation: Secondary | ICD-10-CM | POA: Insufficient documentation

## 2014-01-28 NOTE — Assessment & Plan Note (Signed)
Stable. Medications refilled.  Patient aware that 5 times daily dosing of Xanax will not be given.

## 2014-01-28 NOTE — Assessment & Plan Note (Signed)
Medications refilled.  Patient to return for CPE with fasting labs.

## 2014-01-28 NOTE — Assessment & Plan Note (Signed)
Body mass index is 55.31 kg/(m^2).  Patient with multiple comorbidities as a result of obesity.  Discussed healthy diet and exercise.  Handout given.

## 2014-01-28 NOTE — Assessment & Plan Note (Signed)
BP elevated.  Medications to be restarted.  Refills sent to pharmacy.  DASH diet discussed.  Handout given. Follow-up in 1 week.

## 2014-01-28 NOTE — Assessment & Plan Note (Signed)
Continue current measures.  Referral to Pulmonology placed.

## 2014-01-28 NOTE — Assessment & Plan Note (Signed)
Medications refilled.  CSC signed.  UDS obtained.  Will refer to pain management as we will not prescribe these medications chronically.

## 2014-01-28 NOTE — Assessment & Plan Note (Signed)
Diabetic patient.  No ulceration.  Rx doxycycline.  Supportive measures discussed.  Follow-up 1 week.

## 2014-01-29 ENCOUNTER — Telehealth: Payer: Self-pay | Admitting: *Deleted

## 2014-01-29 ENCOUNTER — Ambulatory Visit: Payer: Medicare Other | Admitting: Physician Assistant

## 2014-01-29 NOTE — Telephone Encounter (Signed)
Pt did not show for appointment 01/29/2014 at 10:45am for follow up

## 2014-01-29 NOTE — Telephone Encounter (Signed)
Patient can reschedule at his convenience.  However is he no-shows for next appointment, he will be dismissed from practice

## 2014-01-30 ENCOUNTER — Ambulatory Visit: Payer: Medicare Other | Admitting: Physician Assistant

## 2014-02-01 ENCOUNTER — Telehealth: Payer: Self-pay | Admitting: *Deleted

## 2014-02-01 ENCOUNTER — Encounter: Payer: Self-pay | Admitting: Physician Assistant

## 2014-02-01 ENCOUNTER — Ambulatory Visit: Payer: Medicare Other | Admitting: Physician Assistant

## 2014-02-01 NOTE — Telephone Encounter (Signed)
Pt did not show for appointment 02/01/2014 at 11:00am for Follow Up

## 2014-02-01 NOTE — Telephone Encounter (Signed)
Patient unfortunately has no-showed the past 3 appointments.  He will be dismissed.  Do not call to reschedule.

## 2014-02-05 ENCOUNTER — Telehealth: Payer: Self-pay | Admitting: Physician Assistant

## 2014-02-05 NOTE — Telephone Encounter (Signed)
Declined.  Patient no-showed for 3 appointments.  Will not refill without follow-up.

## 2014-02-05 NOTE — Telephone Encounter (Signed)
Pt requesting a refill   oxycodone (ROXICODONE) 30 MG immediate release tablet  ALPRAZolam (XANAX) 1 MG tablet amphetamine-dextroamphetamine (ADDERALL) 30 MG tablet

## 2014-02-05 NOTE — Telephone Encounter (Signed)
Notified pt we can not refill his medication w/o a appointment .  Pt apologizes for the no-shows . Pt scheduled 02/06/14 @ 1100

## 2014-02-05 NOTE — Telephone Encounter (Signed)
rx refill - oxycodone 30mg   Last OV- 01/16/14 Last refilled- #120 / 0 rf 01/16/14  Last UDS- Low risk.  & rx refill- xanax 1 mg  Last refilled- #90 / 0 rf 01/16/14 & rx refill- aderall 30 mg  Last refilled- historical provider ?

## 2014-02-06 ENCOUNTER — Ambulatory Visit (INDEPENDENT_AMBULATORY_CARE_PROVIDER_SITE_OTHER): Payer: Medicare Other | Admitting: Physician Assistant

## 2014-02-06 ENCOUNTER — Encounter: Payer: Self-pay | Admitting: Physician Assistant

## 2014-02-06 VITALS — BP 142/85 | HR 93 | Temp 98.2°F | Wt >= 6400 oz

## 2014-02-06 DIAGNOSIS — M549 Dorsalgia, unspecified: Secondary | ICD-10-CM

## 2014-02-06 DIAGNOSIS — F411 Generalized anxiety disorder: Secondary | ICD-10-CM

## 2014-02-06 DIAGNOSIS — M5126 Other intervertebral disc displacement, lumbar region: Secondary | ICD-10-CM

## 2014-02-06 DIAGNOSIS — G8929 Other chronic pain: Secondary | ICD-10-CM

## 2014-02-06 DIAGNOSIS — R600 Localized edema: Secondary | ICD-10-CM

## 2014-02-06 MED ORDER — FUROSEMIDE 20 MG PO TABS: 20.0000 mg | ORAL_TABLET | Freq: Every day | ORAL | Status: DC

## 2014-02-06 MED ORDER — OXYCODONE HCL 30 MG PO TABS: 30.0000 mg | ORAL_TABLET | Freq: Four times a day (QID) | ORAL | Status: DC | PRN

## 2014-02-06 MED ORDER — ALPRAZOLAM 1 MG PO TABS: 1.0000 mg | ORAL_TABLET | Freq: Three times a day (TID) | ORAL | Status: DC | PRN

## 2014-02-06 MED ORDER — AMPHETAMINE-DEXTROAMPHETAMINE 30 MG PO TABS: 60.0000 mg | ORAL_TABLET | Freq: Every day | ORAL | Status: DC

## 2014-02-06 NOTE — Progress Notes (Signed)
Pre visit review using our clinic review tool, if applicable. No additional management support is needed unless otherwise documented below in the visit note. 

## 2014-02-06 NOTE — Progress Notes (Signed)
Patient still with mild pedal edema causing some discomfort.  Endorses cellulitis has resolved with antibiotics given at last visit.  Denies swelling of legs.  Has history of heart valve disorder but last Echo in 7/15 negative for signs of CHF.  Patient does endorses too much salt intake.  Patient presents to clinic today for medication management.  Patient recently gave UDS with non-worrisome results.  Is doing well without the Opana. Is waiting on appointment with Pain Management.  Past Medical History  Diagnosis Date  . Heart valve disorder     s/p echocardiogram  . Hypertension   . Asthma   . Diabetes mellitus   . Hyperlipidemia   . Anaphylactic reaction   . Bipolar disorder   . Adult ADHD   . Morbidly obese   . Transient cerebral ischemia     Unknown  . OSA (obstructive sleep apnea)   . Anxiety     Current Outpatient Prescriptions on File Prior to Visit  Medication Sig Dispense Refill  . albuterol (PROVENTIL HFA;VENTOLIN HFA) 108 (90 BASE) MCG/ACT inhaler Inhale 2 puffs into the lungs every 6 (six) hours as needed for wheezing or shortness of breath. 1 Inhaler 0  . aspirin 325 MG tablet Take 1 tablet (325 mg total) by mouth daily.    . citalopram (CELEXA) 10 MG tablet Take 1 tablet (10 mg total) by mouth daily. 90 tablet 1  . cloNIDine (CATAPRES) 0.2 MG tablet Take 1 tablet (0.2 mg total) by mouth at bedtime. 30 tablet 3  . Fluticasone-Salmeterol (ADVAIR) 250-50 MCG/DOSE AEPB Inhale 1 puff into the lungs every 12 (twelve) hours. 60 each 2  . lisinopril (PRINIVIL,ZESTRIL) 20 MG tablet Take 1 tablet (20 mg total) by mouth daily. 90 tablet 1  . metFORMIN (GLUCOPHAGE) 500 MG tablet Take 1 tablet (500 mg total) by mouth 2 (two) times daily with a meal. 180 tablet 3  . omeprazole (PRILOSEC) 40 MG capsule Take 1 capsule (40 mg total) by mouth daily. 30 capsule 3  . pravastatin (PRAVACHOL) 40 MG tablet Take 1 tablet (40 mg total) by mouth at bedtime. 30 tablet 3  . zolpidem (AMBIEN)  10 MG tablet Take 1 tablet (10 mg total) by mouth at bedtime as needed for sleep. 30 tablet 1   No current facility-administered medications on file prior to visit.    Allergies  Allergen Reactions  . Atenolol Anaphylaxis  . Other Anaphylaxis    "Prednisone like drugs"  . Tylenol [Acetaminophen] Other (See Comments)    unknown    Family History  Problem Relation Age of Onset  . CAD Mother     Living  . Diabetes Mellitus II Mother   . Stroke Mother   . Hypertension Mother   . Congestive Heart Failure Mother   . Kidney disease Mother   . Fibromyalgia Mother   . Thyroid disease Mother   . Hyperlipidemia Mother   . Liver disease Mother   . Alcoholism Father 2635    Deceased  . Arthritis Maternal Grandmother   . Congestive Heart Failure Maternal Grandmother   . Hypertension Maternal Grandmother   . Lung cancer Maternal Grandfather   . Colon cancer Maternal Aunt   . Stomach cancer Maternal Aunt   . Heart disease Other     Paternal & Maternal  . Hypertension Other     Paternal & Maternal  . Hypertension Brother     x3  . Hypertension Sister     #1  . Bipolar disorder  Sister     #1  . ADD / ADHD Son     x3  . Bipolar disorder Son     x3  . Asperger's syndrome Son     History   Social History  . Marital Status: Single    Spouse Name: N/A    Number of Children: N/A  . Years of Education: N/A   Social History Main Topics  . Smoking status: Current Every Day Smoker -- 1.00 packs/day for 23 years    Types: Cigarettes  . Smokeless tobacco: Never Used  . Alcohol Use: 0.0 oz/week    0 Not specified per week     Comment: rare  . Drug Use: No  . Sexual Activity:    Partners: Female   Other Topics Concern  . None   Social History Narrative   Review of Systems - See HPI.  All other ROS are negative.  BP 142/85 mmHg  Pulse 93  Temp(Src) 98.2 F (36.8 C)  Wt 443 lb (200.943 kg)  SpO2 94%  Physical Exam  Constitutional: He is oriented to person, place, and  time and well-developed, well-nourished, and in no distress.  HENT:  Head: Normocephalic and atraumatic.  Eyes: Conjunctivae are normal.  Cardiovascular: Normal rate, regular rhythm, normal heart sounds and intact distal pulses.   Abdominal: Soft. Bowel sounds are normal. He exhibits no distension and no mass. There is no tenderness. There is no rebound and no guarding.  Neurological: He is alert and oriented to person, place, and time.  Skin: Skin is warm and dry. No rash noted.  Psychiatric: Memory normal. His mood appears not anxious. He expresses impulsivity. He does not exhibit a depressed mood. He expresses no homicidal and no suicidal ideation. He expresses no suicidal plans and no homicidal plans.  Poor appearance and hygiene noted.  Vitals reviewed.  Assessment/Plan: Chronic back pain greater than 3 months duration Medications refilled.  Discussed stretching exercises.  Encouraged continued weight loss attempts. Will continue to manage until Pain Clinic takes over.  Pedal edema DASH diet encouraged.  No evidence of wound or lesion.  No murmur or suspicious findings on heart/lung examination. Furosemide 20 mg daily. Elevate legs while resting.  Will reassess at follow-up.

## 2014-02-06 NOTE — Patient Instructions (Signed)
Please follow-up in 1-2 weeks for a Complete Physical.  We will obtain fasting labs at that time.  Continue medications as directed.  Take the Furosemide (Lasix) daily as directed.  Limit your intake of salt. It is ok to restart the Adderall as directed.  I have given you a written prescription for this.  We will refill other medications at your Physical because you will be due for refills at that time.  Always keep feet clean and dry.   Diabetes and Exercise Exercising regularly is important. It is not just about losing weight. It has many health benefits, such as:  Improving your overall fitness, flexibility, and endurance.  Increasing your bone density.  Helping with weight control.  Decreasing your body fat.  Increasing your muscle strength.  Reducing stress and tension.  Improving your overall health. People with diabetes who exercise gain additional benefits because exercise:  Reduces appetite.  Improves the body's use of blood sugar (glucose).  Helps lower or control blood glucose.  Decreases blood pressure.  Helps control blood lipids (such as cholesterol and triglycerides).  Improves the body's use of the hormone insulin by:  Increasing the body's insulin sensitivity.  Reducing the body's insulin needs.  Decreases the risk for heart disease because exercising:  Lowers cholesterol and triglycerides levels.  Increases the levels of good cholesterol (such as high-density lipoproteins [HDL]) in the body.  Lowers blood glucose levels. YOUR ACTIVITY PLAN  Choose an activity that you enjoy and set realistic goals. Your health care provider or diabetes educator can help you make an activity plan that works for you. Exercise regularly as directed by your health care provider. This includes:  Performing resistance training twice a week such as push-ups, sit-ups, lifting weights, or using resistance bands.  Performing 150 minutes of cardio exercises each week such as  walking, running, or playing sports.  Staying active and spending no more than 90 minutes at one time being inactive. Even short bursts of exercise are good for you. Three 10-minute sessions spread throughout the day are just as beneficial as a single 30-minute session. Some exercise ideas include:  Taking the dog for a walk.  Taking the stairs instead of the elevator.  Dancing to your favorite song.  Doing an exercise video.  Doing your favorite exercise with a friend. RECOMMENDATIONS FOR EXERCISING WITH TYPE 1 OR TYPE 2 DIABETES   Check your blood glucose before exercising. If blood glucose levels are greater than 240 mg/dL, check for urine ketones. Do not exercise if ketones are present.  Avoid injecting insulin into areas of the body that are going to be exercised. For example, avoid injecting insulin into:  The arms when playing tennis.  The legs when jogging.  Keep a record of:  Food intake before and after you exercise.  Expected peak times of insulin action.  Blood glucose levels before and after you exercise.  The type and amount of exercise you have done.  Review your records with your health care provider. Your health care provider will help you to develop guidelines for adjusting food intake and insulin amounts before and after exercising.  If you take insulin or oral hypoglycemic agents, watch for signs and symptoms of hypoglycemia. They include:  Dizziness.  Shaking.  Sweating.  Chills.  Confusion.  Drink plenty of water while you exercise to prevent dehydration or heat stroke. Body water is lost during exercise and must be replaced.  Talk to your health care provider before starting an exercise  program to make sure it is safe for you. Remember, almost any type of activity is better than none. Document Released: 05/08/2003 Document Revised: 07/02/2013 Document Reviewed: 07/25/2012 [facility] Patient Information 2015 [place], [place]. This information is  not intended to replace advice given to you by your health care provider. Make sure you discuss any questions you have with your health care provider.  Diabetes and Foot Care Diabetes may cause you to have problems because of poor blood supply (circulation) to your feet and legs. This may cause the skin on your feet to become thinner, break easier, and heal more slowly. Your skin may become dry, and the skin may peel and crack. You may also have nerve damage in your legs and feet causing decreased feeling in them. You may not notice minor injuries to your feet that could lead to infections or more serious problems. Taking care of your feet is one of the most important things you can do for yourself.  HOME CARE INSTRUCTIONS  Wear shoes at all times, even in the house. Do not go barefoot. Bare feet are easily injured.  Check your feet daily for blisters, cuts, and redness. If you cannot see the bottom of your feet, use a mirror or ask someone for help.  Wash your feet with warm water (do not use hot water) and mild soap. Then pat your feet and the areas between your toes until they are completely dry. Do not soak your feet as this can dry your skin.  Apply a moisturizing lotion or petroleum jelly (that does not contain alcohol and is unscented) to the skin on your feet and to dry, brittle toenails. Do not apply lotion between your toes.  Trim your toenails straight across. Do not dig under them or around the cuticle. File the edges of your nails with an emery board or nail file.  Do not cut corns or calluses or try to remove them with medicine.  Wear clean socks or stockings every day. Make sure they are not too tight. Do not wear knee-high stockings since they may decrease blood flow to your legs.  Wear shoes that fit properly and have enough cushioning. To break in new shoes, wear them for just a few hours a day. This prevents you from injuring your feet. Always look in your shoes before you put  them on to be sure there are no objects inside.  Do not cross your legs. This may decrease the blood flow to your feet.  If you find a minor scrape, cut, or break in the skin on your feet, keep it and the skin around it clean and dry. These areas may be cleansed with mild soap and water. Do not cleanse the area with peroxide, alcohol, or iodine.  When you remove an adhesive bandage, be sure not to damage the skin around it.  If you have a wound, look at it several times a day to make sure it is healing.  Do not use heating pads or hot water bottles. They may burn your skin. If you have lost feeling in your feet or legs, you may not know it is happening until it is too late.  Make sure your health care provider performs a complete foot exam at least annually or more often if you have foot problems. Report any cuts, sores, or bruises to your health care provider immediately. SEEK MEDICAL CARE IF:   You have an injury that is not healing.  You have cuts or  breaks in the skin.  You have an ingrown nail.  You notice redness on your legs or feet.  You feel burning or tingling in your legs or feet.  You have pain or cramps in your legs and feet.  Your legs or feet are numb.  Your feet always feel cold. SEEK IMMEDIATE MEDICAL CARE IF:   There is increasing redness, swelling, or pain in or around a wound.  There is a red line that goes up your leg.  Pus is coming from a wound.  You develop a fever or as directed by your health care provider.  You notice a bad smell coming from an ulcer or wound. Document Released: 02/13/2000 Document Revised: 10/18/2012 Document Reviewed: 07/25/2012 [facility] Patient Information 2015 [place]. This information is not intended to replace advice given to you by your health care provider. Make sure you discuss any questions you have with your health care provider.

## 2014-02-08 ENCOUNTER — Encounter: Payer: Self-pay | Admitting: Physician Assistant

## 2014-02-14 DIAGNOSIS — R600 Localized edema: Secondary | ICD-10-CM | POA: Insufficient documentation

## 2014-02-14 NOTE — Assessment & Plan Note (Signed)
DASH diet encouraged.  No evidence of wound or lesion.  No murmur or suspicious findings on heart/lung examination. Furosemide 20 mg daily. Elevate legs while resting.  Will reassess at follow-up.

## 2014-02-14 NOTE — Assessment & Plan Note (Signed)
Medications refilled.  Discussed stretching exercises.  Encouraged continued weight loss attempts. Will continue to manage until Pain Clinic takes over.

## 2014-02-18 ENCOUNTER — Telehealth: Payer: Self-pay | Admitting: Physician Assistant

## 2014-02-18 NOTE — Telephone Encounter (Signed)
Dismissal Letter sent by Certified Mail 02/19/2014

## 2014-02-20 ENCOUNTER — Emergency Department (HOSPITAL_COMMUNITY)
Admission: EM | Admit: 2014-02-20 | Discharge: 2014-02-21 | Disposition: A | Discharge status: Home / Self Care | Payer: Medicare Other | Attending: Emergency Medicine | Admitting: Emergency Medicine

## 2014-02-20 ENCOUNTER — Encounter (HOSPITAL_COMMUNITY): Payer: Self-pay | Admitting: *Deleted

## 2014-02-20 ENCOUNTER — Other Ambulatory Visit: Payer: Self-pay

## 2014-02-20 DIAGNOSIS — E119 Type 2 diabetes mellitus without complications: Secondary | ICD-10-CM | POA: Diagnosis not present

## 2014-02-20 DIAGNOSIS — Z720 Tobacco use: Secondary | ICD-10-CM | POA: Insufficient documentation

## 2014-02-20 DIAGNOSIS — R531 Weakness: Secondary | ICD-10-CM | POA: Diagnosis not present

## 2014-02-20 DIAGNOSIS — I10 Essential (primary) hypertension: Secondary | ICD-10-CM | POA: Diagnosis not present

## 2014-02-20 DIAGNOSIS — S8991XA Unspecified injury of right lower leg, initial encounter: Secondary | ICD-10-CM | POA: Insufficient documentation

## 2014-02-20 DIAGNOSIS — J45901 Unspecified asthma with (acute) exacerbation: Secondary | ICD-10-CM | POA: Insufficient documentation

## 2014-02-20 DIAGNOSIS — F909 Attention-deficit hyperactivity disorder, unspecified type: Secondary | ICD-10-CM | POA: Insufficient documentation

## 2014-02-20 DIAGNOSIS — R61 Generalized hyperhidrosis: Secondary | ICD-10-CM | POA: Insufficient documentation

## 2014-02-20 DIAGNOSIS — R197 Diarrhea, unspecified: Secondary | ICD-10-CM | POA: Insufficient documentation

## 2014-02-20 DIAGNOSIS — S0990XA Unspecified injury of head, initial encounter: Secondary | ICD-10-CM | POA: Diagnosis not present

## 2014-02-20 DIAGNOSIS — Y998 Other external cause status: Secondary | ICD-10-CM | POA: Insufficient documentation

## 2014-02-20 DIAGNOSIS — W19XXXA Unspecified fall, initial encounter: Secondary | ICD-10-CM

## 2014-02-20 DIAGNOSIS — W1839XA Other fall on same level, initial encounter: Secondary | ICD-10-CM | POA: Insufficient documentation

## 2014-02-20 DIAGNOSIS — Z79899 Other long term (current) drug therapy: Secondary | ICD-10-CM | POA: Diagnosis not present

## 2014-02-20 DIAGNOSIS — Y9389 Activity, other specified: Secondary | ICD-10-CM | POA: Insufficient documentation

## 2014-02-20 DIAGNOSIS — S3991XA Unspecified injury of abdomen, initial encounter: Secondary | ICD-10-CM | POA: Insufficient documentation

## 2014-02-20 DIAGNOSIS — Y92149 Unspecified place in prison as the place of occurrence of the external cause: Secondary | ICD-10-CM | POA: Insufficient documentation

## 2014-02-20 DIAGNOSIS — Z8673 Personal history of transient ischemic attack (TIA), and cerebral infarction without residual deficits: Secondary | ICD-10-CM | POA: Diagnosis not present

## 2014-02-20 DIAGNOSIS — S8992XA Unspecified injury of left lower leg, initial encounter: Secondary | ICD-10-CM | POA: Diagnosis not present

## 2014-02-20 DIAGNOSIS — F319 Bipolar disorder, unspecified: Secondary | ICD-10-CM | POA: Diagnosis not present

## 2014-02-20 DIAGNOSIS — F419 Anxiety disorder, unspecified: Secondary | ICD-10-CM | POA: Diagnosis not present

## 2014-02-20 DIAGNOSIS — Z7982 Long term (current) use of aspirin: Secondary | ICD-10-CM | POA: Insufficient documentation

## 2014-02-20 DIAGNOSIS — H02401 Unspecified ptosis of right eyelid: Secondary | ICD-10-CM | POA: Insufficient documentation

## 2014-02-20 DIAGNOSIS — E785 Hyperlipidemia, unspecified: Secondary | ICD-10-CM | POA: Insufficient documentation

## 2014-02-20 LAB — CBC WITH DIFFERENTIAL/PLATELET
BASOS ABS: 0 10*3/uL (ref 0.0–0.1)
Basophils Relative: 0 % (ref 0–1)
Eosinophils Absolute: 0.2 10*3/uL (ref 0.0–0.7)
Eosinophils Relative: 1 % (ref 0–5)
HCT: 45.3 % (ref 39.0–52.0)
Hemoglobin: 14.6 g/dL (ref 13.0–17.0)
LYMPHS PCT: 19 % (ref 12–46)
Lymphs Abs: 2 10*3/uL (ref 0.7–4.0)
MCH: 27.3 pg (ref 26.0–34.0)
MCHC: 32.2 g/dL (ref 30.0–36.0)
MCV: 84.7 fL (ref 78.0–100.0)
Monocytes Absolute: 0.7 10*3/uL (ref 0.1–1.0)
Monocytes Relative: 7 % (ref 3–12)
NEUTROS ABS: 7.7 10*3/uL (ref 1.7–7.7)
NEUTROS PCT: 73 % (ref 43–77)
PLATELETS: 343 10*3/uL (ref 150–400)
RBC: 5.35 MIL/uL (ref 4.22–5.81)
RDW: 14.3 % (ref 11.5–15.5)
WBC: 10.5 10*3/uL (ref 4.0–10.5)

## 2014-02-20 NOTE — ED Notes (Signed)
[facility]  C/o sob with lt sided numbness for 2 days with abd pain also.  Handcuffs in place.  Deputy at the pts bedside

## 2014-02-21 ENCOUNTER — Emergency Department (HOSPITAL_COMMUNITY): Payer: Medicare Other

## 2014-02-21 ENCOUNTER — Encounter (HOSPITAL_COMMUNITY): Payer: Self-pay | Admitting: Radiology

## 2014-02-21 DIAGNOSIS — S3991XA Unspecified injury of abdomen, initial encounter: Secondary | ICD-10-CM | POA: Diagnosis not present

## 2014-02-21 LAB — COMPREHENSIVE METABOLIC PANEL
ALT: 30 U/L (ref 0–53)
AST: 35 U/L (ref 0–37)
Albumin: 3.1 g/dL — ABNORMAL LOW (ref 3.5–5.2)
Alkaline Phosphatase: 63 U/L (ref 39–117)
Anion gap: 8 (ref 5–15)
BILIRUBIN TOTAL: 0.9 mg/dL (ref 0.3–1.2)
BUN: 6 mg/dL (ref 6–23)
CALCIUM: 8.9 mg/dL (ref 8.4–10.5)
CHLORIDE: 102 meq/L (ref 96–112)
CO2: 26 mmol/L (ref 19–32)
Creatinine, Ser: 0.84 mg/dL (ref 0.50–1.35)
GFR calc Af Amer: >90 mL/min (ref >90)
Glucose, Bld: 148 mg/dL — ABNORMAL HIGH (ref 70–99)
Potassium: 4.2 mmol/L (ref 3.5–5.1)
SODIUM: 136 mmol/L (ref 135–145)
Total Protein: 7.9 g/dL (ref 6.0–8.3)

## 2014-02-21 LAB — URINALYSIS, ROUTINE W REFLEX MICROSCOPIC
Bilirubin Urine: NEGATIVE
Glucose, UA: NEGATIVE mg/dL
Hgb urine dipstick: NEGATIVE
Ketones, ur: NEGATIVE mg/dL
Leukocytes, UA: NEGATIVE
Nitrite: NEGATIVE
Protein, ur: NEGATIVE mg/dL
SPECIFIC GRAVITY, URINE: 1.017 (ref 1.005–1.030)
Urobilinogen, UA: 1 mg/dL (ref 0.0–1.0)
pH: 8.5 — ABNORMAL HIGH (ref 5.0–8.0)

## 2014-02-21 LAB — LIPASE, BLOOD: LIPASE: 30 U/L (ref 11–59)

## 2014-02-21 LAB — I-STAT CG4 LACTIC ACID, ED: LACTIC ACID, VENOUS: 2.11 mmol/L (ref 0.5–2.2)

## 2014-02-21 LAB — TROPONIN I: Troponin I: <0.03 ng/mL (ref <0.031)

## 2014-02-21 MED ORDER — SODIUM CHLORIDE 0.9 % IV BOLUS (SEPSIS)
1000.0000 mL | Freq: Once | INTRAVENOUS | Status: AC
Start: 2014-02-21 — End: 2014-02-21
  Administered 2014-02-21: 1000 mL via INTRAVENOUS

## 2014-02-21 MED ORDER — MORPHINE SULFATE 4 MG/ML IJ SOLN
6.0000 mg | Freq: Once | INTRAMUSCULAR | Status: AC
  Administered 2014-02-21: 6 mg via INTRAVENOUS
  Filled 2014-02-21: qty 2

## 2014-02-21 MED ORDER — OXYCODONE HCL 5 MG PO TABS
5.0000 mg | ORAL_TABLET | Freq: Once | ORAL | Status: AC
Start: 2014-02-21 — End: 2014-02-21
  Administered 2014-02-21: 5 mg via ORAL
  Filled 2014-02-21: qty 1

## 2014-02-21 MED ORDER — IOHEXOL 300 MG/ML  SOLN: 100.0000 mL | Freq: Once | INTRAMUSCULAR | Status: DC | PRN

## 2014-02-21 MED ORDER — OXYCODONE HCL 5 MG PO TABS
5.0000 mg | ORAL_TABLET | Freq: Once | ORAL | Status: AC
  Administered 2014-02-21: 5 mg via ORAL
  Filled 2014-02-21: qty 1

## 2014-02-21 MED ORDER — ONDANSETRON HCL 4 MG/2ML IJ SOLN
4.0000 mg | Freq: Once | INTRAMUSCULAR | Status: AC
  Administered 2014-02-21: 4 mg via INTRAVENOUS
  Filled 2014-02-21: qty 2

## 2014-02-21 NOTE — ED Provider Notes (Addendum)
CSN: [id number]     Arrival date & time 02/20/14  2317 History  This chart was scribed for Bryan Gallegos Bryan Heinrich, MD by [name], ED Scribe. This patient was seen in room B19C/B19C and the patient's care was started at 12:00 AM.    Chief Complaint  Patient presents with  . Shortness of Breath   HPI HPI Comments: [name] is a 38 y.o. male with PMHx of HTN, hyperlipidemia and diabetes who presents to the Emergency Department complaining of diffuse and worse on the right sided abdominal pain onset 2 days prior. Pt states he has associated vomiting and diarrhea. Pt states that movement or eating makes the pain worse. Pt states that he is constantly sweating. Pt denies fever or other related symptoms. Patient believes it is his gallbladder acting up.    Pt states that he is also having some left sided numbness onset 2 days prior. He states he has associated headache, blurred vision, and weakness. Pt states that sometime he has been having difficulty speaking described as he wants to say one thing and it come out different. Pt states he has been having difficulty ambulating as well. Pt states that he tend to fall on his left side. Denies taking any medications PTA.  Denies dysuria.    Past Medical History  Diagnosis Date  . Heart valve disorder     s/p echocardiogram  . Hypertension   . Asthma   . Diabetes mellitus   . Hyperlipidemia   . Anaphylactic reaction   . Bipolar disorder   . Adult ADHD   . Morbidly obese   . Transient cerebral ischemia     Unknown  . OSA (obstructive sleep apnea)   . Anxiety    Past Surgical History  Procedure Laterality Date  . Carpal tunnel release    . Nose surgery    . Wisdom tooth extraction    . Tooth extraction     Family History  Problem Relation Age of Onset  . CAD Mother     Living  . Diabetes Mellitus II Mother   . Stroke Mother   . Hypertension Mother   . Congestive Heart Failure Mother   . Kidney disease Mother   .  Fibromyalgia Mother   . Thyroid disease Mother   . Hyperlipidemia Mother   . Liver disease Mother   . Alcoholism Father 4835    Deceased  . Arthritis Maternal Grandmother   . Congestive Heart Failure Maternal Grandmother   . Hypertension Maternal Grandmother   . Lung cancer Maternal Grandfather   . Colon cancer Maternal Aunt   . Stomach cancer Maternal Aunt   . Heart disease Other     Paternal & Maternal  . Hypertension Other     Paternal & Maternal  . Hypertension Brother     x3  . Hypertension Sister     #1  . Bipolar disorder Sister     #1  . ADD / ADHD Son     x3  . Bipolar disorder Son     x3  . Asperger's syndrome Son    History  Substance Use Topics  . Smoking status: Current Every Day Smoker -- 1.00 packs/day for 23 years    Types: Cigarettes  . Smokeless tobacco: Never Used  . Alcohol Use: 0.0 oz/week    0 Not specified per week     Comment: rare    Review of Systems  Constitutional: Positive for diaphoresis. Negative for fever.  Eyes:  Positive for visual disturbance.  Gastrointestinal: Positive for vomiting, abdominal pain and diarrhea.  Genitourinary: Negative for dysuria.  Neurological: Positive for weakness, numbness and headaches.  All other systems reviewed and are negative.     Allergies  Atenolol; Other; and Tylenol  Home Medications   Prior to Admission medications   Medication Sig Start Date End Date Taking? Authorizing Provider  albuterol (PROVENTIL HFA;VENTOLIN HFA) 108 (90 BASE) MCG/ACT inhaler Inhale 2 puffs into the lungs every 6 (six) hours as needed for wheezing or shortness of breath. 01/16/14   Waldon Merl, PA-C  ALPRAZolam Prudy Feeler) 1 MG tablet Take 1 tablet (1 mg total) by mouth 3 (three) times daily as needed for anxiety. 02/06/14   Waldon Merl, PA-C  amphetamine-dextroamphetamine (ADDERALL) 30 MG tablet Take 2 tablets by mouth daily. 02/06/14   Waldon Merl, PA-C  aspirin 325 MG tablet Take 1 tablet (325 mg total) by  mouth daily. 09/04/13   [name], MD  citalopram (CELEXA) 10 MG tablet Take 1 tablet (10 mg total) by mouth daily. 01/16/14   Waldon Merl, PA-C  cloNIDine (CATAPRES) 0.2 MG tablet Take 1 tablet (0.2 mg total) by mouth at bedtime. 01/16/14   Waldon Merl, PA-C  Fluticasone-Salmeterol (ADVAIR) 250-50 MCG/DOSE AEPB Inhale 1 puff into the lungs every 12 (twelve) hours. 01/16/14   Waldon Merl, PA-C  furosemide (LASIX) 20 MG tablet Take 1 tablet (20 mg total) by mouth daily. 02/06/14   Waldon Merl, PA-C  lisinopril (PRINIVIL,ZESTRIL) 20 MG tablet Take 1 tablet (20 mg total) by mouth daily. 01/16/14   Waldon Merl, PA-C  metFORMIN (GLUCOPHAGE) 500 MG tablet Take 1 tablet (500 mg total) by mouth 2 (two) times daily with a meal. 01/16/14   Waldon Merl, PA-C  omeprazole (PRILOSEC) 40 MG capsule Take 1 capsule (40 mg total) by mouth daily. 01/16/14   Waldon Merl, PA-C  oxycodone (ROXICODONE) 30 MG immediate release tablet Take 1 tablet (30 mg total) by mouth every 6 (six) hours as needed for pain. For pain. 02/06/14   Waldon Merl, PA-C  pravastatin (PRAVACHOL) 40 MG tablet Take 1 tablet (40 mg total) by mouth at bedtime. 01/16/14   Waldon Merl, PA-C  zolpidem (AMBIEN) 10 MG tablet Take 1 tablet (10 mg total) by mouth at bedtime as needed for sleep. 01/16/14   Waldon Merl, PA-C   Triage Vitals: BP 152/83 mmHg  Pulse 81  Temp(Src) 97.9 F (36.6 C)  Resp 22  SpO2 95%  Physical Exam  Constitutional: He is oriented to person, place, and time. Vital signs are normal. He appears well-developed and well-nourished.  Non-toxic appearance. He does not appear ill. No distress.  Obese male   HENT:  Head: Normocephalic and atraumatic.  Nose: Nose normal.  Mouth/Throat: Oropharynx is clear and moist. No oropharyngeal exudate.  Eyes: Conjunctivae and EOM are normal. Pupils are equal, round, and reactive to light. No scleral icterus.  Right eye ptosis   Neck: Normal  range of motion. Neck supple. No tracheal deviation, no edema, no erythema and normal range of motion present. No thyroid mass and no thyromegaly present.  Cardiovascular: Normal rate, regular rhythm, S1 normal, S2 normal, normal heart sounds, intact distal pulses and normal pulses.  Exam reveals no gallop and no friction rub.   No murmur heard. Pulses:      Radial pulses are 2+ on the right side, and 2+ on the left side.  Dorsalis pedis pulses are 2+ on the right side, and 2+ on the left side.  Pulmonary/Chest: Effort normal and breath sounds normal. No respiratory distress. He has no wheezes. He has no rhonchi. He has no rales.  Abdominal: Soft. Normal appearance and bowel sounds are normal. He exhibits no distension, no ascites and no mass. There is no hepatosplenomegaly. There is tenderness. There is no rebound, no guarding and no CVA tenderness.  Diffuse abdominal tenderness worse on right side   Musculoskeletal: Normal range of motion. He exhibits edema. He exhibits no tenderness.  2+ bilateral leg edema, LUE weakness   Lymphadenopathy:    He has no cervical adenopathy.  Neurological: He is alert and oriented to person, place, and time. He has normal strength. No cranial nerve deficit or sensory deficit. He exhibits normal muscle tone. GCS eye subscore is 4. GCS verbal subscore is 5. GCS motor subscore is 6.  5 out of 5 strength on the right, 3 out of 5 strength in the left. Normal sensation. Normal cerebellar testing.  Skin: Skin is warm, dry and intact. No petechiae and no rash noted. He is not diaphoretic. No erythema. No pallor.  Psychiatric: He has a normal mood and affect. His behavior is normal. Judgment normal.  Nursing note and vitals reviewed.   ED Course  Procedures (including critical care time) DIAGNOSTIC STUDIES: Oxygen Saturation is 95% on RA, adequate by my interpretation.    COORDINATION OF CARE: 12:12 AM-Discussed treatment plan with pt at bedside and pt agreed  to plan.     Labs Review Labs Reviewed  COMPREHENSIVE METABOLIC PANEL - Abnormal; Notable for the following:    Glucose, Bld 148 (*)    Albumin 3.1 (*)    All other components within normal limits  URINALYSIS, ROUTINE W REFLEX MICROSCOPIC - Abnormal; Notable for the following:    pH 8.5 (*)    All other components within normal limits  URINE CULTURE  CBC WITH DIFFERENTIAL  TROPONIN I  LIPASE, BLOOD  I-STAT CG4 LACTIC ACID, ED    Imaging Review Ct Head Wo Contrast  02/21/2014   CLINICAL DATA:  Acute onset of right-sided numbness for 2 days. Initial encounter.  EXAM: CT HEAD WITHOUT CONTRAST  TECHNIQUE: Contiguous axial images were obtained from the base of the skull through the vertex without intravenous contrast.  COMPARISON:  CT of the head performed 09/02/2013, and MRI of the brain from 09/03/2013  FINDINGS: There is no evidence of acute infarction, mass lesion, or intra- or extra-axial hemorrhage on CT.  The posterior fossa, including the cerebellum, brainstem and fourth ventricle, is within normal limits. The third and lateral ventricles, and basal ganglia are unremarkable in appearance. The cerebral hemispheres are symmetric in appearance, with normal gray-white differentiation. No mass effect or midline shift is seen.  There is no evidence of fracture; visualized osseous structures are unremarkable in appearance. The orbits are within normal limits. Mucosal thickening is noted at the right maxillary sinus, and there is mild partial opacification of the right mastoid air cells. The remaining paranasal sinuses and left mastoid air cells are well-aerated. No significant soft tissue abnormalities are seen.  IMPRESSION: 1. No acute intracranial pathology seen on CT. 2. Mucosal thickening at the right maxillary sinus, and mild partial opacification of the right mastoid air cells.   Electronically Signed   By: Roanna Raider M.D.   On: 02/21/2014 02:52   Ct Abdomen Pelvis W  Contrast  02/21/2014   CLINICAL DATA:  Right-sided  abdominal pain, nausea and vomiting, symptoms for 2 days  EXAM: CT ABDOMEN AND PELVIS WITH CONTRAST  TECHNIQUE: Multidetector CT imaging of the abdomen and pelvis was performed using the standard protocol following bolus administration of intravenous contrast.  CONTRAST:  100 cc Omnipaque 300 IV contrast  COMPARISON:  09/02/2013  FINDINGS: Lung bases are clear.  Mild motion artifact is present at multiple levels, degrading imaging. Hepatic hypodensity suggests steatosis. No focal abnormality. Adrenal glands, kidneys, spleen, and pancreas are unremarkable allowing for motion artifact at multiple levels. Porta hepatis nodes measuring up to 1.5 cm image 35 are stable. No ascites or lymphadenopathy.  The appendix is not identified but there is no secondary evidence for acute appendicitis. No bowel wall thickening or focal segmental dilatation. Fluid within the second and third portions of the otherwise normal-appearing duodenum is likely related to ingested content. No evidence for small bowel obstruction. Small fat containing left greater than right inguinal hernia. No acute osseous abnormality.  IMPRESSION: No acute intra-abdominal or pelvic pathology.  Fluid distending the second and third portions of the otherwise normal-appearing duodenum, likely reflecting ingested content.  Hepatic steatosis.   Electronically Signed   By: Christiana PellantGretchen  Green M.D.   On: 02/21/2014 02:52     EKG Interpretation None     MUSE not working:NSR, rate 81, nml intervals, rightward axis, no ischemic changes   MDM   Final diagnoses:  None      Patient since emergency department for multiple complaints. His abdominal pain, worse with meals with fevers, is concerning for gallbladder pathology. CT scan is negative. Laboratory studies are unremarkable. He was given morphine and Zofran for pain relief. He continues to ask for more pain medication, he was given oral oxycodone.  Patient also complains of blurry vision, slurred speech and left-sided weakness. Upon my repeat evaluation the patient he was found moving around in bed and lifting himself up.  Upon entering the room patient was found resting comfortably in no acute distress. I do not believe he has this left-sided weakness. Despite this neurology consult was already obtained, they're currently evaluating the patient.   Neurologist agreed with my consultation, they do not believe this is coming from the patient's brain as he crosses the midline and has full strength. Patient will likely be able [facility] with primary care follow-up. His vital signs remain within his normal limits and he is safe for discharge.  I personally performed the services described in this documentation, which was scribed in my presence. The recorded information has been reviewed and is accurate.      Bryan Gallegos Jada Kuhnert, MD 02/21/14 [id number]  Bryan Gallegos Cylie Dor, MD 02/21/14 [phone]

## 2014-02-21 NOTE — ED Notes (Signed)
Attempted IV start unsuccessful 

## 2014-02-21 NOTE — ED Notes (Signed)
Pt getting dressed for D/C told not to get up without assistance, pt went to get in wheelchair and shackles tripped on wheelchair, pt fell to all four, then rolled onto back, c/o bilateral knee pain, EDP notified

## 2014-02-21 NOTE — Discharge Instructions (Signed)
Abdominal Pain [name], he was seen today for abdominal pain, your blood work and CT scan were normal. Also neurology evaluated you and does not believe your numbness is coming from an emergent disease of your brain. Follow up with your primary care physician as soon as possible for continued management. If symptoms worsen come back to emergency department and medially for repeat evaluation.Thank you. Many things can cause belly (abdominal) pain. Most times, the belly pain is not dangerous. Many cases of belly pain can be watched and treated at home. HOME CARE   Do not take medicines that help you go poop (laxatives) unless told to by your doctor.  Only take medicine as told by your doctor.  Eat or drink as told by your doctor. Your doctor will tell you if you should be on a special diet. GET HELP IF:  You do not know what is causing your belly pain.  You have belly pain while you are sick to your stomach (nauseous) or have runny poop (diarrhea).  You have pain while you pee or poop.  Your belly pain wakes you up at night.  You have belly pain that gets worse or better when you eat.  You have belly pain that gets worse when you eat fatty foods.  You have a fever. GET HELP RIGHT AWAY IF:   The pain does not go away within 2 hours.  You keep throwing up (vomiting).  The pain changes and is only in the right or left part of the belly.  You have bloody or tarry looking poop. MAKE SURE YOU:   Understand these instructions.  Will watch your condition.  Will get help right away if you are not doing well or get worse. Document Released: 08/04/2007 Document Revised: 02/20/2013 Document Reviewed: 10/25/2012 [facility] Patient Information 2015 [place], [place]. This information is not intended to replace advice given to you by your health care provider. Make sure you discuss any questions you have with your health care provider. Fibular Fracture, Ankle, Adult, Treated With or Without  Immobilization A fibular fracture at your ankle is a break (fracture) bone in the smallest of the two bones in your lower leg, located on the outside of your leg (fibula) close to the area at your ankle joint. CAUSES  Rolling your ankle.  Twisting your ankle.  Extreme flexing or extending of your foot.  Severe force on your ankle as when falling from a distance. RISK FACTORS  Jumping activities.  Participation in sports.  Osteoporosis.  Advanced age.  Previous ankle injuries. SIGNS AND SYMPTOMS  Pain.  Swelling.  Inability to put weight on injured ankle.  Bruising.  Bone deformities at site of injury. DIAGNOSIS  This fracture is diagnosed with the help of an X-ray exam. TREATMENT  If the fractured bone did not move out of place it usually will heal without problems and does casting or splinting. If immobilization is needed for comfort or the fractured bone moved out of place and will not heal properly with immobilization, a cast or splint will be used. HOME CARE INSTRUCTIONS   Apply ice to the area of injury:  Put ice in a plastic bag.  Place a towel between your skin and the bag.  Leave the ice on for 20 minutes, 2-3 times a day.  Use crutches as directed. Resume walking without crutches as directed by your health care provider.  Only take over-the-counter or prescription medicines for pain, discomfort, or fever as directed by your health  care provider.  If you have a removable splint or boot, do not remove the boot unless directed by your health care provider. SEEK MEDICAL CARE IF:   You have continued pain or more swelling  The medications do not control the pain. SEEK IMMEDIATE MEDICAL CARE IF:  You develop severe pain in the leg or foot.  Your skin or nails below the injury turn blue or grey or feel cold or numb. MAKE SURE YOU:   Understand these instructions.  Will watch your condition.  Will get help right away if you are not doing well or  get worse. Document Released: 02/15/2005 Document Revised: 12/06/2012 Document Reviewed: 09/27/2012 [facility] Patient Information 2015 [place], [place]. This information is not intended to replace advice given to you by your health care provider. Make sure you discuss any questions you have with your health care provider.

## 2014-02-21 NOTE — Progress Notes (Signed)
Orthopedic Tech Progress Note Patient Details:  Bryan Gallegos 01/15/1976 [id number]  Ortho Devices Type of Ortho Device: Knee Immobilizer, [name] splint Ortho Device/Splint Interventions: Application Dr. Riley Churcheshanged order  [name], [name] 02/21/2014, 6:51 AM

## 2014-02-22 LAB — URINE CULTURE
CULTURE: NO GROWTH
Colony Count: NO GROWTH

## 2014-03-06 ENCOUNTER — Ambulatory Visit (INDEPENDENT_AMBULATORY_CARE_PROVIDER_SITE_OTHER): Payer: Medicare Other | Admitting: Physician Assistant

## 2014-03-06 ENCOUNTER — Encounter: Payer: Self-pay | Admitting: Physician Assistant

## 2014-03-06 VITALS — BP 166/94 | HR 113 | Temp 97.6°F | Resp 22 | Ht 74.0 in | Wt >= 6400 oz

## 2014-03-06 DIAGNOSIS — R5383 Other fatigue: Secondary | ICD-10-CM

## 2014-03-06 DIAGNOSIS — S82402D Unspecified fracture of shaft of left fibula, subsequent encounter for closed fracture with routine healing: Secondary | ICD-10-CM

## 2014-03-06 DIAGNOSIS — R600 Localized edema: Secondary | ICD-10-CM

## 2014-03-06 DIAGNOSIS — F3162 Bipolar disorder, current episode mixed, moderate: Secondary | ICD-10-CM

## 2014-03-06 DIAGNOSIS — K2100 Gastro-esophageal reflux disease with esophagitis, without bleeding: Secondary | ICD-10-CM

## 2014-03-06 DIAGNOSIS — M6289 Other specified disorders of muscle: Secondary | ICD-10-CM

## 2014-03-06 DIAGNOSIS — E785 Hyperlipidemia, unspecified: Secondary | ICD-10-CM

## 2014-03-06 DIAGNOSIS — E119 Type 2 diabetes mellitus without complications: Secondary | ICD-10-CM

## 2014-03-06 DIAGNOSIS — J4530 Mild persistent asthma, uncomplicated: Secondary | ICD-10-CM

## 2014-03-06 DIAGNOSIS — R531 Weakness: Secondary | ICD-10-CM

## 2014-03-06 DIAGNOSIS — I10 Essential (primary) hypertension: Secondary | ICD-10-CM

## 2014-03-06 LAB — GLUCOSE, POCT (MANUAL RESULT ENTRY): POC Glucose: 196 mg/dl — AB (ref 70–99)

## 2014-03-06 MED ORDER — OMEPRAZOLE 20 MG PO CPDR: 20.0000 mg | DELAYED_RELEASE_CAPSULE | Freq: Every day | ORAL | Status: DC

## 2014-03-06 MED ORDER — PRAVASTATIN SODIUM 40 MG PO TABS: 40.0000 mg | ORAL_TABLET | Freq: Every day | ORAL | Status: DC

## 2014-03-06 MED ORDER — FUROSEMIDE 20 MG PO TABS: 20.0000 mg | ORAL_TABLET | Freq: Every day | ORAL | Status: DC

## 2014-03-06 MED ORDER — LISINOPRIL 20 MG PO TABS: 20.0000 mg | ORAL_TABLET | Freq: Every day | ORAL | Status: DC

## 2014-03-06 MED ORDER — ALBUTEROL SULFATE HFA 108 (90 BASE) MCG/ACT IN AERS: 2.0000 | INHALATION_SPRAY | Freq: Four times a day (QID) | RESPIRATORY_TRACT | Status: DC | PRN

## 2014-03-06 MED ORDER — CLONIDINE HCL 0.2 MG PO TABS: 0.2000 mg | ORAL_TABLET | Freq: Every day | ORAL | Status: DC

## 2014-03-06 MED ORDER — METFORMIN HCL 500 MG PO TABS: 500.0000 mg | ORAL_TABLET | Freq: Two times a day (BID) | ORAL | Status: DC

## 2014-03-06 MED ORDER — FLUTICASONE-SALMETEROL 250-50 MCG/DOSE IN AEPB: 1.0000 | INHALATION_SPRAY | Freq: Two times a day (BID) | RESPIRATORY_TRACT | Status: DC

## 2014-03-06 MED ORDER — CITALOPRAM HYDROBROMIDE 10 MG PO TABS: 10.0000 mg | ORAL_TABLET | Freq: Every day | ORAL | Status: DC

## 2014-03-06 NOTE — Patient Instructions (Signed)
You will be contacted regarding an US of your abdomen to further assess symptoms. I have sent medications to [facility] for pickup. Please take as directed.  I want to see you in 1 week to reassess BP. Stop by front to discuss referral to Orthopedics -- Speak with Marj or Victorino DikeJennifer. Resume all of your medications as directed.

## 2014-03-06 NOTE — Progress Notes (Signed)
Pre visit review using our clinic review tool, if applicable. No additional management support is needed unless otherwise documented below in the visit note/SLS  

## 2014-03-11 ENCOUNTER — Encounter: Payer: Self-pay | Admitting: Physician Assistant

## 2014-03-11 ENCOUNTER — Ambulatory Visit (INDEPENDENT_AMBULATORY_CARE_PROVIDER_SITE_OTHER): Payer: Medicare Other | Admitting: Physician Assistant

## 2014-03-11 VITALS — BP 154/80 | HR 111 | Temp 98.0°F | Resp 22 | Ht 74.0 in | Wt >= 6400 oz

## 2014-03-11 DIAGNOSIS — F1910 Other psychoactive substance abuse, uncomplicated: Secondary | ICD-10-CM

## 2014-03-11 DIAGNOSIS — Z765 Malingerer [conscious simulation]: Secondary | ICD-10-CM

## 2014-03-11 DIAGNOSIS — I10 Essential (primary) hypertension: Secondary | ICD-10-CM

## 2014-03-11 DIAGNOSIS — S82402A Unspecified fracture of shaft of left fibula, initial encounter for closed fracture: Secondary | ICD-10-CM | POA: Insufficient documentation

## 2014-03-11 NOTE — Assessment & Plan Note (Signed)
Referral placed to Orthopedic surgery for further evaluation and management. Patient is not due for refills of pain medication, and due to dismissal and drug-seeking behavior, none will be given from this clinic.  Suggest he use his current Rx as directed.

## 2014-03-11 NOTE — Progress Notes (Signed)
Patient presents to clinic today for 1 week follow-up to discuss restarting Adderall for ADD.  Was held at last visit as BP and pulse above normal limits.  Patient had been out of his BP medications as he said his pharmacy would not fill his prescriptions.  Medication was sent to new pharmacy.  Patient states he has picked them up but has not taken them yet.   Is again requesting refill of narcotics.  Patient recently arrested for possession of illegal drugs.  Is not due for refills of his medications for a few weeks.    Past Medical History  Diagnosis Date  . Heart valve disorder     s/p echocardiogram  . Hypertension   . Asthma   . Diabetes mellitus   . Hyperlipidemia   . Anaphylactic reaction   . Bipolar disorder   . Adult ADHD   . Morbidly obese   . Transient cerebral ischemia     Unknown  . OSA (obstructive sleep apnea)   . Anxiety     Current Outpatient Prescriptions on File Prior to Visit  Medication Sig Dispense Refill  . albuterol (PROVENTIL HFA;VENTOLIN HFA) 108 (90 BASE) MCG/ACT inhaler Inhale 2 puffs into the lungs every 6 (six) hours as needed for wheezing or shortness of breath. 1 Inhaler 0  . ALPRAZolam (XANAX) 1 MG tablet Take 1 tablet (1 mg total) by mouth 3 (three) times daily as needed for anxiety. 90 tablet 0  . amphetamine-dextroamphetamine (ADDERALL) 30 MG tablet Take 2 tablets by mouth daily. 60 tablet 0  . aspirin 325 MG tablet Take 1 tablet (325 mg total) by mouth daily.    . citalopram (CELEXA) 10 MG tablet Take 1 tablet (10 mg total) by mouth daily. 30 tablet 1  . cloNIDine (CATAPRES) 0.2 MG tablet Take 1 tablet (0.2 mg total) by mouth at bedtime. 30 tablet 0  . Fluticasone-Salmeterol (ADVAIR) 250-50 MCG/DOSE AEPB Inhale 1 puff into the lungs every 12 (twelve) hours. 60 each 2  . furosemide (LASIX) 20 MG tablet Take 1 tablet (20 mg total) by mouth daily. 30 tablet 3  . lisinopril (PRINIVIL,ZESTRIL) 20 MG tablet Take 1 tablet (20 mg total) by mouth daily.  90 tablet 1  . metFORMIN (GLUCOPHAGE) 500 MG tablet Take 1 tablet (500 mg total) by mouth 2 (two) times daily with a meal. 180 tablet 3  . omeprazole (PRILOSEC) 20 MG capsule Take 1 capsule (20 mg total) by mouth daily. 30 capsule 0  . oxycodone (ROXICODONE) 30 MG immediate release tablet Take 1 tablet (30 mg total) by mouth every 6 (six) hours as needed for pain. For pain. 120 tablet 0  . pravastatin (PRAVACHOL) 40 MG tablet Take 1 tablet (40 mg total) by mouth at bedtime. 30 tablet 3  . zolpidem (AMBIEN) 10 MG tablet Take 1 tablet (10 mg total) by mouth at bedtime as needed for sleep. (Patient taking differently: Take 10 mg by mouth at bedtime. ) 30 tablet 1   No current facility-administered medications on file prior to visit.    Allergies  Allergen Reactions  . Atenolol Anaphylaxis  . Other Anaphylaxis    "Prednisone like drugs"  . Tylenol [Acetaminophen] Other (See Comments)    unknown    Family History  Problem Relation Age of Onset  . CAD Mother     Living  . Diabetes Mellitus II Mother   . Stroke Mother   . Hypertension Mother   . Congestive Heart Failure Mother   .  Kidney disease Mother   . Fibromyalgia Mother   . Thyroid disease Mother   . Hyperlipidemia Mother   . Liver disease Mother   . Alcoholism Father 58    Deceased  . Arthritis Maternal Grandmother   . Congestive Heart Failure Maternal Grandmother   . Hypertension Maternal Grandmother   . Lung cancer Maternal Grandfather   . Colon cancer Maternal Aunt   . Stomach cancer Maternal Aunt   . Heart disease Other     Paternal & Maternal  . Hypertension Other     Paternal & Maternal  . Hypertension Brother     x3  . Hypertension Sister     #1  . Bipolar disorder Sister     #1  . ADD / ADHD Son     x3  . Bipolar disorder Son     x3  . Asperger's syndrome Son     History   Social History  . Marital Status: Single    Spouse Name: N/A    Number of Children: N/A  . Years of Education: N/A   Social  History Main Topics  . Smoking status: Current Every Day Smoker -- 1.00 packs/day for 23 years    Types: Cigarettes  . Smokeless tobacco: Never Used  . Alcohol Use: 0.0 oz/week    0 Not specified per week     Comment: rare  . Drug Use: No  . Sexual Activity:    Partners: Female   Other Topics Concern  . None   Social History Narrative   Review of Systems - See HPI.  All other ROS are negative.  BP 154/80 mmHg  Pulse 111  Temp(Src) 98 F (36.7 C) (Oral)  Resp 22  Ht $R'6\' 2"'ZY$  (1.88 m)  Wt 439 lb 4 oz (199.242 kg)  BMI 56.37 kg/m2  SpO2 95%  Physical Exam  Constitutional: He is oriented to person, place, and time and well-developed, well-nourished, and in no distress.  HENT:  Head: Normocephalic and atraumatic.  Eyes: Conjunctivae are normal.  Cardiovascular: Regular rhythm, normal heart sounds and intact distal pulses.   tachycardic  Pulmonary/Chest: Effort normal and breath sounds normal. No respiratory distress. He has no wheezes. He has no rales. He exhibits no tenderness.  Neurological: He is alert and oriented to person, place, and time.  Skin: Skin is dry. No rash noted.  Vitals reviewed.   Recent Results (from the past 2160 hour(s))  CBC with Differential     Status: None   Collection Time: 02/20/14 11:27 PM  Result Value Ref Range   WBC 10.5 4.0 - 10.5 K/uL   RBC 5.35 4.22 - 5.81 MIL/uL   Hemoglobin 14.6 13.0 - 17.0 g/dL   HCT 45.3 39.0 - 52.0 %   MCV 84.7 78.0 - 100.0 fL   MCH 27.3 26.0 - 34.0 pg   MCHC 32.2 30.0 - 36.0 g/dL   RDW 14.3 11.5 - 15.5 %   Platelets 343 150 - 400 K/uL   Neutrophils Relative % 73 43 - 77 %   Neutro Abs 7.7 1.7 - 7.7 K/uL   Lymphocytes Relative 19 12 - 46 %   Lymphs Abs 2.0 0.7 - 4.0 K/uL   Monocytes Relative 7 3 - 12 %   Monocytes Absolute 0.7 0.1 - 1.0 K/uL   Eosinophils Relative 1 0 - 5 %   Eosinophils Absolute 0.2 0.0 - 0.7 K/uL   Basophils Relative 0 0 - 1 %   Basophils Absolute 0.0 0.0 -  0.1 K/uL  Comprehensive  metabolic panel     Status: Abnormal   Collection Time: 02/20/14 11:27 PM  Result Value Ref Range   Sodium 136 135 - 145 mmol/L    Comment: Please note change in reference range.   Potassium 4.2 3.5 - 5.1 mmol/L    Comment: Please note change in reference range.   Chloride 102 96 - 112 mEq/L   CO2 26 19 - 32 mmol/L   Glucose, Bld 148 (H) 70 - 99 mg/dL   BUN 6 6 - 23 mg/dL   Creatinine, Ser 0.84 0.50 - 1.35 mg/dL   Calcium 8.9 8.4 - 10.5 mg/dL   Total Protein 7.9 6.0 - 8.3 g/dL   Albumin 3.1 (L) 3.5 - 5.2 g/dL   AST 35 0 - 37 U/L   ALT 30 0 - 53 U/L   Alkaline Phosphatase 63 39 - 117 U/L   Total Bilirubin 0.9 0.3 - 1.2 mg/dL   GFR calc non Af Amer >90 >90 mL/min   GFR calc Af Amer >90 >90 mL/min    Comment: (NOTE) The eGFR has been calculated using the CKD EPI equation. This calculation has not been validated in all clinical situations. eGFR's persistently <90 mL/min signify possible Chronic Kidney Disease.    Anion gap 8 5 - 15  Troponin I     Status: None   Collection Time: 02/20/14 11:27 PM  Result Value Ref Range   Troponin I <0.03 <0.031 ng/mL    Comment:        NO INDICATION OF MYOCARDIAL INJURY. Please note change in reference range.   Lipase, blood     Status: None   Collection Time: 02/20/14 11:27 PM  Result Value Ref Range   Lipase 30 11 - 59 U/L  I-Stat CG4 Lactic Acid, ED     Status: None   Collection Time: 02/21/14 12:46 AM  Result Value Ref Range   Lactic Acid, Venous 2.11 0.5 - 2.2 mmol/L  Urinalysis, Routine w reflex microscopic     Status: Abnormal   Collection Time: 02/21/14 12:53 AM  Result Value Ref Range   Color, Urine YELLOW YELLOW   APPearance CLEAR CLEAR   Specific Gravity, Urine 1.017 1.005 - 1.030   pH 8.5 (H) 5.0 - 8.0   Glucose, UA NEGATIVE NEGATIVE mg/dL   Hgb urine dipstick NEGATIVE NEGATIVE   Bilirubin Urine NEGATIVE NEGATIVE   Ketones, ur NEGATIVE NEGATIVE mg/dL   Protein, ur NEGATIVE NEGATIVE mg/dL   Urobilinogen, UA 1.0 0.0 -  1.0 mg/dL   Nitrite NEGATIVE NEGATIVE   Leukocytes, UA NEGATIVE NEGATIVE    Comment: MICROSCOPIC NOT DONE ON URINES WITH NEGATIVE PROTEIN, BLOOD, LEUKOCYTES, NITRITE, OR GLUCOSE <1000 mg/dL.  Urine culture     Status: None   Collection Time: 02/21/14 12:53 AM  Result Value Ref Range   Specimen Description URINE, CLEAN CATCH    Special Requests NONE    Colony Count NO GROWTH Performed at [facility]     Culture NO GROWTH Performed at [facility]     Report Status 02/22/2014 FINAL   POCT Glucose (CBG)     Status: Abnormal   Collection Time: 03/06/14 11:58 AM  Result Value Ref Range   POC Glucose 196 (A) 70 - 99 mg/dl    Comment: Non-fasting    Assessment/Plan: Essential hypertension, benign BP above goal. Patient states he has filled his medications.  At first states he has not taken them but when readdressed states  he has.  Speaking with the pharmacist, it seems the patient has been picked up his controlled medications at last fill but refused his chronic medications for BP, diabetes, etc.  Patient has been encouraged to fill medications and take as directed.   Drug-seeking behavior Religiously fills narcotic and controlled substances.  Drug-seeking behavior has been noted in ER and in clinic. Giving this and recent incarceration for drug possession, will obtain UDS screen.  No refills will even be considered until these results have been reviewed.  Patient has already been dismissed from practice and is in the last 2 weeks of his grace-period of care.  Again encouraged him to try to set up a new PMD before care completely runs out.

## 2014-03-11 NOTE — Progress Notes (Signed)
Patient presents to clinic today for ER follow-up for fall and left fibular fracture. Patient was seen in ER on 02/20/14 c/o fall occurring 02/18/14. Workup including x-rays of lower spine and bilateral knees revealed a fibular fracture of left lower extremity.  Patient was also complaining of left-sided weakness but examination was normal.  MD noted patient seemed to be drug-seeking.  Patient was discharged back to jail with close follow-up.  Was not referred to Orthopedics of giving any type of bracing/casting for his knee/leg.  Patient complains of continued left leg pain.  Denies swelling.  Has been ambulating with minor difficulty.  Denies any headache, AMS or residual left-sided weakness. Is feeling well overall. States he has not been able to fill his chronic medications.  On [place] Database review it seems patient was able to successfully fill all controlled substances. Patient has been dismissed from practice for multiple no-shows and has demonstrated several episodes of drug-seeking behavior.  Past Medical History  Diagnosis Date  . Heart valve disorder     s/p echocardiogram  . Hypertension   . Asthma   . Diabetes mellitus   . Hyperlipidemia   . Anaphylactic reaction   . Bipolar disorder   . Adult ADHD   . Morbidly obese   . Transient cerebral ischemia     Unknown  . OSA (obstructive sleep apnea)   . Anxiety     Current Outpatient Prescriptions on File Prior to Visit  Medication Sig Dispense Refill  . ALPRAZolam (XANAX) 1 MG tablet Take 1 tablet (1 mg total) by mouth 3 (three) times daily as needed for anxiety. 90 tablet 0  . amphetamine-dextroamphetamine (ADDERALL) 30 MG tablet Take 2 tablets by mouth daily. 60 tablet 0  . aspirin 325 MG tablet Take 1 tablet (325 mg total) by mouth daily.    Marland Kitchen oxycodone (ROXICODONE) 30 MG immediate release tablet Take 1 tablet (30 mg total) by mouth every 6 (six) hours as needed for pain. For pain. 120 tablet 0  . zolpidem (AMBIEN) 10 MG  tablet Take 1 tablet (10 mg total) by mouth at bedtime as needed for sleep. (Patient taking differently: Take 10 mg by mouth at bedtime. ) 30 tablet 1   No current facility-administered medications on file prior to visit.    Allergies  Allergen Reactions  . Atenolol Anaphylaxis  . Other Anaphylaxis    "Prednisone like drugs"  . Tylenol [Acetaminophen] Other (See Comments)    unknown    Family History  Problem Relation Age of Onset  . CAD Mother     Living  . Diabetes Mellitus II Mother   . Stroke Mother   . Hypertension Mother   . Congestive Heart Failure Mother   . Kidney disease Mother   . Fibromyalgia Mother   . Thyroid disease Mother   . Hyperlipidemia Mother   . Liver disease Mother   . Alcoholism Father 34    Deceased  . Arthritis Maternal Grandmother   . Congestive Heart Failure Maternal Grandmother   . Hypertension Maternal Grandmother   . Lung cancer Maternal Grandfather   . Colon cancer Maternal Aunt   . Stomach cancer Maternal Aunt   . Heart disease Other     Paternal & Maternal  . Hypertension Other     Paternal & Maternal  . Hypertension Brother     x3  . Hypertension Sister     #1  . Bipolar disorder Sister     #1  . ADD /  ADHD Son     x3  . Bipolar disorder Son     x3  . Asperger's syndrome Son     History   Social History  . Marital Status: Single    Spouse Name: N/A    Number of Children: N/A  . Years of Education: N/A   Social History Main Topics  . Smoking status: Current Every Day Smoker -- 1.00 packs/day for 23 years    Types: Cigarettes  . Smokeless tobacco: Never Used  . Alcohol Use: 0.0 oz/week    0 Not specified per week     Comment: rare  . Drug Use: No  . Sexual Activity:    Partners: Female   Other Topics Concern  . None   Social History Narrative    Review of Systems - See HPI.  All other ROS are negative.  BP 166/94 mmHg  Pulse 113  Temp(Src) 97.6 F (36.4 C) (Oral)  Resp 22  Ht 6' 2" (1.88 m)  Wt 436  lb 6 oz (197.938 kg)  BMI 56.00 kg/m2  SpO2 95%  Physical Exam  Constitutional: He is oriented to person, place, and time and well-developed, well-nourished, and in no distress.  HENT:  Head: Normocephalic and atraumatic.  Eyes: Conjunctivae are normal.  Neck: Neck supple.  Pulmonary/Chest: Effort normal and breath sounds normal. No respiratory distress. He has no wheezes. He has no rales. He exhibits no tenderness.  Neurological: He is alert and oriented to person, place, and time.  Skin: Skin is warm and dry. No rash noted.  Psychiatric: Affect normal.  Vitals reviewed.   Recent Results (from the past 2160 hour(s))  CBC with Differential     Status: None   Collection Time: 02/20/14 11:27 PM  Result Value Ref Range   WBC 10.5 4.0 - 10.5 K/uL   RBC 5.35 4.22 - 5.81 MIL/uL   Hemoglobin 14.6 13.0 - 17.0 g/dL   HCT 45.3 39.0 - 52.0 %   MCV 84.7 78.0 - 100.0 fL   MCH 27.3 26.0 - 34.0 pg   MCHC 32.2 30.0 - 36.0 g/dL   RDW 14.3 11.5 - 15.5 %   Platelets 343 150 - 400 K/uL   Neutrophils Relative % 73 43 - 77 %   Neutro Abs 7.7 1.7 - 7.7 K/uL   Lymphocytes Relative 19 12 - 46 %   Lymphs Abs 2.0 0.7 - 4.0 K/uL   Monocytes Relative 7 3 - 12 %   Monocytes Absolute 0.7 0.1 - 1.0 K/uL   Eosinophils Relative 1 0 - 5 %   Eosinophils Absolute 0.2 0.0 - 0.7 K/uL   Basophils Relative 0 0 - 1 %   Basophils Absolute 0.0 0.0 - 0.1 K/uL  Comprehensive metabolic panel     Status: Abnormal   Collection Time: 02/20/14 11:27 PM  Result Value Ref Range   Sodium 136 135 - 145 mmol/L    Comment: Please note change in reference range.   Potassium 4.2 3.5 - 5.1 mmol/L    Comment: Please note change in reference range.   Chloride 102 96 - 112 mEq/L   CO2 26 19 - 32 mmol/L   Glucose, Bld 148 (H) 70 - 99 mg/dL   BUN 6 6 - 23 mg/dL   Creatinine, Ser 0.84 0.50 - 1.35 mg/dL   Calcium 8.9 8.4 - 10.5 mg/dL   Total Protein 7.9 6.0 - 8.3 g/dL   Albumin 3.1 (L) 3.5 - 5.2 g/dL   AST 35  0 - 37 U/L   ALT  30 0 - 53 U/L   Alkaline Phosphatase 63 39 - 117 U/L   Total Bilirubin 0.9 0.3 - 1.2 mg/dL   GFR calc non Af Amer >90 >90 mL/min   GFR calc Af Amer >90 >90 mL/min    Comment: (NOTE) The eGFR has been calculated using the CKD EPI equation. This calculation has not been validated in all clinical situations. eGFR's persistently <90 mL/min signify possible Chronic Kidney Disease.    Anion gap 8 5 - 15  Troponin I     Status: None   Collection Time: 02/20/14 11:27 PM  Result Value Ref Range   Troponin I <0.03 <0.031 ng/mL    Comment:        NO INDICATION OF MYOCARDIAL INJURY. Please note change in reference range.   Lipase, blood     Status: None   Collection Time: 02/20/14 11:27 PM  Result Value Ref Range   Lipase 30 11 - 59 U/L  I-Stat CG4 Lactic Acid, ED     Status: None   Collection Time: 02/21/14 12:46 AM  Result Value Ref Range   Lactic Acid, Venous 2.11 0.5 - 2.2 mmol/L  Urinalysis, Routine w reflex microscopic     Status: Abnormal   Collection Time: 02/21/14 12:53 AM  Result Value Ref Range   Color, Urine YELLOW YELLOW   APPearance CLEAR CLEAR   Specific Gravity, Urine 1.017 1.005 - 1.030   pH 8.5 (H) 5.0 - 8.0   Glucose, UA NEGATIVE NEGATIVE mg/dL   Hgb urine dipstick NEGATIVE NEGATIVE   Bilirubin Urine NEGATIVE NEGATIVE   Ketones, ur NEGATIVE NEGATIVE mg/dL   Protein, ur NEGATIVE NEGATIVE mg/dL   Urobilinogen, UA 1.0 0.0 - 1.0 mg/dL   Nitrite NEGATIVE NEGATIVE   Leukocytes, UA NEGATIVE NEGATIVE    Comment: MICROSCOPIC NOT DONE ON URINES WITH NEGATIVE PROTEIN, BLOOD, LEUKOCYTES, NITRITE, OR GLUCOSE <1000 mg/dL.  Urine culture     Status: None   Collection Time: 02/21/14 12:53 AM  Result Value Ref Range   Specimen Description URINE, CLEAN CATCH    Special Requests NONE    Colony Count NO GROWTH Performed at [facility]     Culture NO GROWTH Performed at [facility]     Report Status 02/22/2014 FINAL   POCT Glucose (CBG)     Status:  Abnormal   Collection Time: 03/06/14 11:58 AM  Result Value Ref Range   POC Glucose 196 (A) 70 - 99 mg/dl    Comment: Non-fasting    Assessment/Plan: Left fibular fracture Referral placed to Orthopedic surgery for further evaluation and management. Patient is not due for refills of pain medication, and due to dismissal and drug-seeking behavior, none will be given from this clinic.  Suggest he use his current Rx as directed.    Left-sided weakness Exam within normal limits.  Denies ever having these symptoms even though it is clearly stated in ER encounter. Exam in ER within normal limits.  Patient shown to fain symptoms in ER in order to get narcotic pain medications.  No need for further workup at present.

## 2014-03-11 NOTE — Patient Instructions (Signed)
Please stop by the lab for a Urine drug screen.  I will not refill your controlled medication until I have this result, giving your recent incarceration and you have been taking more pain medication than is directed.  Please get your BP medications filled and take them as directed.  We will not restart the Adderall if your BP and pulse are not controlled.

## 2014-03-11 NOTE — Assessment & Plan Note (Signed)
Exam within normal limits.  Denies ever having these symptoms even though it is clearly stated in ER encounter. Exam in ER within normal limits.  Patient shown to fain symptoms in ER in order to get narcotic pain medications.  No need for further workup at present.

## 2014-03-11 NOTE — Progress Notes (Signed)
Pre visit review using our clinic review tool, if applicable. No additional management support is needed unless otherwise documented below in the visit note/SLS  

## 2014-03-13 ENCOUNTER — Telehealth: Payer: Self-pay | Admitting: Physician Assistant

## 2014-03-13 NOTE — Telephone Encounter (Signed)
Caller name: [name], Ata L Relationship to patient: Can be reached: [phone]   Reason for call: Pt inquiring about lab results.

## 2014-03-13 NOTE — Telephone Encounter (Signed)
Do not show any labs performed; please advise/SLS

## 2014-03-13 NOTE — Telephone Encounter (Signed)
Informed patient of this and that we will call him when results are available.

## 2014-03-13 NOTE — Telephone Encounter (Signed)
For what?  Need to be more specific.  He is not due for controlled substances yet so if he is out, he was taking them more than directed.  Giving his recent behavior and incarceration no controlled substances will be refilled until I have these results.  He will also only receive enough to complete his 30-day grace period once results are in as he has been dismissed from practice.  He should start looking for a new provider.

## 2014-03-13 NOTE — Telephone Encounter (Signed)
Patient called back wanting to know what else he could take??

## 2014-03-13 NOTE — Telephone Encounter (Signed)
Results take up to 7 days to result.  There will be no order in system until result is in since it goes to outside lab.

## 2014-03-13 NOTE — Telephone Encounter (Signed)
Patient states that it was a urine sample for his medications.

## 2014-03-14 DIAGNOSIS — F1120 Opioid dependence, uncomplicated: Secondary | ICD-10-CM | POA: Insufficient documentation

## 2014-03-14 NOTE — Assessment & Plan Note (Signed)
Religiously fills narcotic and controlled substances.  Drug-seeking behavior has been noted in ER and in clinic. Giving this and recent incarceration for drug possession, will obtain UDS screen.  No refills will even be considered until these results have been reviewed.  Patient has already been dismissed from practice and is in the last 2 weeks of his grace-period of care.  Again encouraged him to try to set up a new PMD before care completely runs out.

## 2014-03-14 NOTE — Telephone Encounter (Signed)
Requesting urine results. 

## 2014-03-14 NOTE — Telephone Encounter (Signed)
No results in as of yesterday evening.  I am not in office today to check for report.  Again as stated before, we will call him once results are in.

## 2014-03-14 NOTE — Assessment & Plan Note (Signed)
BP above goal. Patient states he has filled his medications.  At first states he has not taken them but when readdressed states he has.  Speaking with the pharmacist, it seems the patient has been picked up his controlled medications at last fill but refused his chronic medications for BP, diabetes, etc.  Patient has been encouraged to fill medications and take as directed.

## 2014-03-18 NOTE — Telephone Encounter (Signed)
Pt called back to give another contact # [phone] (best #)

## 2014-03-18 NOTE — Telephone Encounter (Signed)
Caller name: [name], Dajion L Relation to pt: self  Call back number: [phone]   Reason for call:  Pt checking on the status of urine results.

## 2014-03-18 NOTE — Telephone Encounter (Signed)
Research officer, trade unionront Desk Staff is aware:   Regis BillSharon L Scates, CMA at 03/18/2014 12:04 PM     Status: Signed       Expand All Collapse All   Onalee Huaavid in Lab is checking status on results of UDS/SLS

## 2014-03-18 NOTE — Telephone Encounter (Signed)
Pt would like CMA to call him back

## 2014-03-18 NOTE — Telephone Encounter (Signed)
David in Lab is checking status on results of UDS/SLS

## 2014-03-19 NOTE — Telephone Encounter (Signed)
LMOM with contact name and number [for return call, IF NEEDED] RE: UDS Results and further provider instructions; Letter Mailed/SLS

## 2014-03-19 NOTE — Telephone Encounter (Signed)
PLEASE do not add any further return call, called back, called again messages to this note: Patient has been discharged from practice; he will NOT receive any medications until UDS results are received, informed front desk 01.18.16, please see the note this message was added to: Regis BillSharon L Scates, CMA at 03/18/2014 4:05 PM     Status: Signed       Expand All Collapse All   Front Desk Staff is aware:   Regis BillSharon L Scates, CMA at 03/18/2014 12:04 PM     Status: Signed       Expand All Collapse All  Onalee Huaavid in Lab is checking status on results of UDS/SLS

## 2014-03-19 NOTE — Telephone Encounter (Signed)
Patient calling back regarding this. Best # [phone]

## 2014-03-19 NOTE — Telephone Encounter (Signed)
Patient informed/SLS  

## 2014-03-19 NOTE — Telephone Encounter (Signed)
Lab results just received from Toxicology representative.  He is prescribed Xanax which he tested negative for.  Not a problem at present but he tested positive for Oxazepam and Temazepam which are metabolites of other benzodiazepines but not Xanax.  No refills will be given. His 30-day grace period is ending this week after being terminated.  Again, he should have found a new provider.  I recommend he seek new provider for ongoing medical care.

## 2014-03-20 ENCOUNTER — Telehealth: Payer: Self-pay | Admitting: Physician Assistant

## 2014-03-20 ENCOUNTER — Other Ambulatory Visit: Payer: Self-pay | Admitting: Physician Assistant

## 2014-03-20 NOTE — Telephone Encounter (Signed)
Patient has been dismissed due to reasons mentioned below.  Again, he tested positive for Temazepam and metabolites which is not in the same pathway as Xanax.  He was already dismissed many weeks ago before this UDS was obtained.  He is terminated without any more refills or care.

## 2014-03-20 NOTE — Telephone Encounter (Signed)
Caller name:Braggs, Silas Relation to IH:KVQQpt:self Call back number:[phone] Pharmacy:  Reason for call: pt would like to speak with you states he has questions about his lab results, states he spoke with you on yesterday.

## 2014-03-20 NOTE — Telephone Encounter (Signed)
UDS results were discussed in detail 01.19.16 with patient and the fact that if was unsure of what medications were given to him in prison [not px for him] that he would have needed to be forthright about that matter when asked in clinic and he did not until I spoke with him yesterday about the results; also he was told that he tested Negative for the Alprazolam that he was prescribed. Patient is aware that he has been dismissed from practice and refills were pending Only upon the results of his Urine Drug Screening test. Patient was made aware that he should be up front & honest with future providers from the beginning of the relationship/SLS

## 2014-03-21 ENCOUNTER — Institutional Professional Consult (permissible substitution): Payer: Medicare Other | Admitting: Critical Care Medicine

## 2014-03-25 ENCOUNTER — Telehealth: Payer: Self-pay | Admitting: Family Medicine

## 2014-03-25 NOTE — Telephone Encounter (Signed)
Pt is taking oxycodone, xanax 1mg  tid, ambien, and multiple other meds, will call pt back tomorrow.

## 2014-03-27 ENCOUNTER — Telehealth: Payer: Self-pay

## 2014-03-27 NOTE — Telephone Encounter (Signed)
His Xanax was negative -- Xanax si a short acting medication so still can be negative with good use of medication.  The problem was that he tested positive for medication he should not have been taking.  Either way, at the time we obtained the UDS he was already terminated and now his 30-day grace period is more than expired.  I have his results in a folder in my office and will have a copy ready for him to pick up on Friday if he wishes.

## 2014-03-27 NOTE — Telephone Encounter (Signed)
Patient presents to the lobby to get results of his drug screen printed. Advised that the report has not been scanned at this point. States that his results should have shown that he takes his xanax everyday. Advised that the results had been reviewed and the decision but that I would make PCP aware. Advised that the results should be in his chart soon.

## 2014-03-27 NOTE — Telephone Encounter (Signed)
Pt returning call, advised we couldn't accept him as a pt. Pt voiced understanding.

## 2014-03-29 NOTE — Telephone Encounter (Signed)
Noted  

## 2014-05-06 ENCOUNTER — Telehealth: Payer: Self-pay | Admitting: Physician Assistant

## 2014-05-06 NOTE — Telephone Encounter (Signed)
Certified dismissal letter returned as undeliverable, unclaimed, return to sender after three attempts by [employer]. Letter placed in another envelope and resent as 1st class mail on 05/07/14 which does not require a signature. DJC

## 2014-05-26 ENCOUNTER — Emergency Department (HOSPITAL_COMMUNITY)
Admission: EM | Admit: 2014-05-26 | Discharge: 2014-05-26 | Disposition: A | Discharge status: Home / Self Care | Payer: Medicare Other | Attending: Emergency Medicine | Admitting: Emergency Medicine

## 2014-05-26 ENCOUNTER — Emergency Department (HOSPITAL_COMMUNITY): Payer: Medicare Other

## 2014-05-26 ENCOUNTER — Encounter (HOSPITAL_COMMUNITY): Payer: Self-pay | Admitting: Oncology

## 2014-05-26 DIAGNOSIS — J45909 Unspecified asthma, uncomplicated: Secondary | ICD-10-CM | POA: Insufficient documentation

## 2014-05-26 DIAGNOSIS — E119 Type 2 diabetes mellitus without complications: Secondary | ICD-10-CM | POA: Insufficient documentation

## 2014-05-26 DIAGNOSIS — R0602 Shortness of breath: Secondary | ICD-10-CM | POA: Diagnosis not present

## 2014-05-26 DIAGNOSIS — Z720 Tobacco use: Secondary | ICD-10-CM | POA: Insufficient documentation

## 2014-05-26 DIAGNOSIS — Z8669 Personal history of other diseases of the nervous system and sense organs: Secondary | ICD-10-CM | POA: Diagnosis not present

## 2014-05-26 DIAGNOSIS — E785 Hyperlipidemia, unspecified: Secondary | ICD-10-CM | POA: Diagnosis not present

## 2014-05-26 DIAGNOSIS — I10 Essential (primary) hypertension: Secondary | ICD-10-CM | POA: Diagnosis not present

## 2014-05-26 DIAGNOSIS — R112 Nausea with vomiting, unspecified: Secondary | ICD-10-CM | POA: Insufficient documentation

## 2014-05-26 DIAGNOSIS — R1011 Right upper quadrant pain: Secondary | ICD-10-CM | POA: Diagnosis present

## 2014-05-26 DIAGNOSIS — Z87828 Personal history of other (healed) physical injury and trauma: Secondary | ICD-10-CM | POA: Diagnosis not present

## 2014-05-26 DIAGNOSIS — F319 Bipolar disorder, unspecified: Secondary | ICD-10-CM | POA: Diagnosis not present

## 2014-05-26 DIAGNOSIS — F419 Anxiety disorder, unspecified: Secondary | ICD-10-CM | POA: Insufficient documentation

## 2014-05-26 DIAGNOSIS — R042 Hemoptysis: Secondary | ICD-10-CM | POA: Diagnosis not present

## 2014-05-26 DIAGNOSIS — Z7982 Long term (current) use of aspirin: Secondary | ICD-10-CM | POA: Insufficient documentation

## 2014-05-26 DIAGNOSIS — Z8673 Personal history of transient ischemic attack (TIA), and cerebral infarction without residual deficits: Secondary | ICD-10-CM | POA: Insufficient documentation

## 2014-05-26 DIAGNOSIS — R109 Unspecified abdominal pain: Secondary | ICD-10-CM

## 2014-05-26 DIAGNOSIS — Z79899 Other long term (current) drug therapy: Secondary | ICD-10-CM | POA: Diagnosis not present

## 2014-05-26 LAB — BASIC METABOLIC PANEL
ANION GAP: 11 (ref 5–15)
BUN: 13 mg/dL (ref 6–23)
CALCIUM: 9.1 mg/dL (ref 8.4–10.5)
CHLORIDE: 96 mmol/L (ref 96–112)
CO2: 27 mmol/L (ref 19–32)
Creatinine, Ser: 1.03 mg/dL (ref 0.50–1.35)
GFR calc Af Amer: >90 mL/min (ref >90)
GFR calc non Af Amer: 89 mL/min — ABNORMAL LOW (ref >90)
GLUCOSE: 119 mg/dL — AB (ref 70–99)
Potassium: 4.5 mmol/L (ref 3.5–5.1)
SODIUM: 134 mmol/L — AB (ref 135–145)

## 2014-05-26 LAB — RAPID URINE DRUG SCREEN, HOSP PERFORMED
Amphetamines: NOT DETECTED
BENZODIAZEPINES: POSITIVE — AB
Barbiturates: NOT DETECTED
COCAINE: NOT DETECTED
OPIATES: POSITIVE — AB
Tetrahydrocannabinol: NOT DETECTED

## 2014-05-26 LAB — URINALYSIS, ROUTINE W REFLEX MICROSCOPIC
GLUCOSE, UA: NEGATIVE mg/dL
HGB URINE DIPSTICK: NEGATIVE
Ketones, ur: NEGATIVE mg/dL
NITRITE: NEGATIVE
PROTEIN: NEGATIVE mg/dL
SPECIFIC GRAVITY, URINE: 1.024 (ref 1.005–1.030)
Urobilinogen, UA: 4 mg/dL — ABNORMAL HIGH (ref 0.0–1.0)
pH: 5.5 (ref 5.0–8.0)

## 2014-05-26 LAB — CBC WITH DIFFERENTIAL/PLATELET
Basophils Absolute: 0 10*3/uL (ref 0.0–0.1)
Basophils Relative: 0 % (ref 0–1)
EOS PCT: 3 % (ref 0–5)
Eosinophils Absolute: 0.3 10*3/uL (ref 0.0–0.7)
HEMATOCRIT: 45.7 % (ref 39.0–52.0)
Hemoglobin: 15 g/dL (ref 13.0–17.0)
Lymphocytes Relative: 26 % (ref 12–46)
Lymphs Abs: 2.5 10*3/uL (ref 0.7–4.0)
MCH: 30.5 pg (ref 26.0–34.0)
MCHC: 32.8 g/dL (ref 30.0–36.0)
MCV: 92.9 fL (ref 78.0–100.0)
Monocytes Absolute: 1.2 10*3/uL — ABNORMAL HIGH (ref 0.1–1.0)
Monocytes Relative: 13 % — ABNORMAL HIGH (ref 3–12)
Neutro Abs: 5.6 10*3/uL (ref 1.7–7.7)
Neutrophils Relative %: 58 % (ref 43–77)
Platelets: 290 10*3/uL (ref 150–400)
RBC: 4.92 MIL/uL (ref 4.22–5.81)
RDW: 15.5 % (ref 11.5–15.5)
WBC: 9.5 10*3/uL (ref 4.0–10.5)

## 2014-05-26 LAB — HEPATIC FUNCTION PANEL
ALK PHOS: 85 U/L (ref 39–117)
ALT: 760 U/L — ABNORMAL HIGH (ref 0–53)
AST: 741 U/L — ABNORMAL HIGH (ref 0–37)
Albumin: 3.8 g/dL (ref 3.5–5.2)
BILIRUBIN TOTAL: 2.1 mg/dL — AB (ref 0.3–1.2)
Bilirubin, Direct: 0.8 mg/dL — ABNORMAL HIGH (ref 0.0–0.5)
Indirect Bilirubin: 1.3 mg/dL — ABNORMAL HIGH (ref 0.3–0.9)
Total Protein: 7.9 g/dL (ref 6.0–8.3)

## 2014-05-26 LAB — URINE MICROSCOPIC-ADD ON

## 2014-05-26 LAB — I-STAT TROPONIN, ED: Troponin i, poc: 0 ng/mL (ref 0.00–0.08)

## 2014-05-26 MED ORDER — SUCRALFATE 1 G PO TABS
1.0000 g | ORAL_TABLET | Freq: Once | ORAL | Status: AC
  Administered 2014-05-26: 1 g via ORAL
  Filled 2014-05-26: qty 1

## 2014-05-26 MED ORDER — MORPHINE SULFATE 4 MG/ML IJ SOLN
6.0000 mg | Freq: Once | INTRAMUSCULAR | Status: AC
  Administered 2014-05-26: 6 mg via INTRAVENOUS
  Filled 2014-05-26: qty 2

## 2014-05-26 MED ORDER — FENTANYL CITRATE 0.05 MG/ML IJ SOLN
100.0000 ug | Freq: Once | INTRAMUSCULAR | Status: AC
  Administered 2014-05-26: 100 ug via INTRAVENOUS
  Filled 2014-05-26: qty 2

## 2014-05-26 MED ORDER — ONDANSETRON HCL 4 MG/2ML IJ SOLN
4.0000 mg | Freq: Once | INTRAMUSCULAR | Status: AC
  Administered 2014-05-26: 4 mg via INTRAVENOUS
  Filled 2014-05-26: qty 2

## 2014-05-26 MED ORDER — OXYCODONE HCL 5 MG PO TABS: 5.0000 mg | ORAL_TABLET | ORAL | Status: DC | PRN

## 2014-05-26 MED ORDER — HYDROCODONE-IBUPROFEN 7.5-200 MG PO TABS: 1.0000 | ORAL_TABLET | Freq: Four times a day (QID) | ORAL | Status: DC | PRN

## 2014-05-26 MED ORDER — GI COCKTAIL ~~LOC~~
30.0000 mL | Freq: Once | ORAL | Status: AC
  Administered 2014-05-26: 30 mL via ORAL
  Filled 2014-05-26: qty 30

## 2014-05-26 MED ORDER — METOCLOPRAMIDE HCL 10 MG PO TABS: 10.0000 mg | ORAL_TABLET | Freq: Four times a day (QID) | ORAL | Status: DC

## 2014-05-26 MED ORDER — CIPROFLOXACIN HCL 250 MG PO TABS: 250.0000 mg | ORAL_TABLET | Freq: Two times a day (BID) | ORAL | Status: DC

## 2014-05-26 MED ORDER — KETOROLAC TROMETHAMINE 30 MG/ML IJ SOLN
30.0000 mg | Freq: Once | INTRAMUSCULAR | Status: AC
  Administered 2014-05-26: 30 mg via INTRAVENOUS
  Filled 2014-05-26: qty 1

## 2014-05-26 MED ORDER — LORAZEPAM 1 MG PO TABS
1.0000 mg | ORAL_TABLET | Freq: Once | ORAL | Status: AC
  Administered 2014-05-26: 1 mg via ORAL
  Filled 2014-05-26: qty 1

## 2014-05-26 MED ORDER — SODIUM CHLORIDE 0.9 % IV SOLN
Freq: Once | INTRAVENOUS | Status: AC
  Administered 2014-05-26: 05:00:00 via INTRAVENOUS

## 2014-05-26 NOTE — ED Notes (Signed)
Pt having an anxiety attack.  Ebbie Ridgehris Lawyer, PA made aware.

## 2014-05-26 NOTE — ED Notes (Addendum)
Patient has called for nurse three times in the last 20 minutes, patient was informed initially by this writer that the nurse had to go talk to the doctor about getting meds and would get them to him as soon as they could. Patient has been given a coke and informed that nurse would be in as soon as they were done with another patient. Warm blanket given.

## 2014-05-26 NOTE — ED Notes (Signed)
Pt presented d/t SOB.  Pt is barely able to speak, diaphoretic O2 sats in the 80's on RA.

## 2014-05-26 NOTE — ED Provider Notes (Signed)
CSN: [id number]     Arrival date & time 05/26/14  0340 History   First MD Initiated Contact with Patient 05/26/14 0441     Chief Complaint  Patient presents with  . Shortness of Breath     (Consider location/radiation/quality/duration/timing/severity/associated sxs/prior Treatment) HPI Comments: Patient reports that for the past 3-4 weeks.  He's had intermittent episodes of vomiting with several episodes of blood-streaked mucus.  He also states that he has been having hematuria.  [place]  He is to have a ultrasound of his "stomach" neck week.  He ran out of his Phenergan.  Several days ago, although when he was taking it really didn't help his nausea and vomiting.  He reports that he has right mid quadrant pain.  Denies any diarrhea or constipation.  He doesn't know what his blood sugar is because he doesn't have her monitor. Patient is very vague with his history, answering "I don't know" to many direct questions  Patient is a 39 y.o. male presenting with shortness of breath. The history is provided by the patient.  Shortness of Breath Severity:  Moderate Onset quality:  Unable to specify Timing:  Intermittent Progression:  Unchanged Chronicity:  Chronic Relieved by:  Nothing Worsened by:  Activity Ineffective treatments:  Rest Associated symptoms: abdominal pain, hemoptysis and vomiting   Associated symptoms: no cough and no rash   Abdominal pain:    Location:  RLQ and RUQ   Quality:  Stabbing   Severity:  Moderate   Onset quality:  Gradual   Timing:  Constant   Progression:  Worsening   Chronicity:  New Vomiting:    Quality:  Bright red blood   Number of occurrences:  2   Severity:  Unable to specify   Timing:  Intermittent   Past Medical History  Diagnosis Date  . Heart valve disorder     s/p echocardiogram  . Hypertension   . Asthma   . Diabetes mellitus   . Hyperlipidemia   . Anaphylactic reaction   . Bipolar disorder   . Adult  ADHD   . Morbidly obese   . Transient cerebral ischemia     Unknown  . OSA (obstructive sleep apnea)   . Anxiety    Past Surgical History  Procedure Laterality Date  . Carpal tunnel release    . Nose surgery    . Wisdom tooth extraction    . Tooth extraction     Family History  Problem Relation Age of Onset  . CAD Mother     Living  . Diabetes Mellitus II Mother   . Stroke Mother   . Hypertension Mother   . Congestive Heart Failure Mother   . Kidney disease Mother   . Fibromyalgia Mother   . Thyroid disease Mother   . Hyperlipidemia Mother   . Liver disease Mother   . Alcoholism Father [id number]    Deceased  . Arthritis Maternal Grandmother   . Congestive Heart Failure Maternal Grandmother   . Hypertension Maternal Grandmother   . Lung cancer Maternal Grandfather   . Colon cancer Maternal Aunt   . Stomach cancer Maternal Aunt   . Heart disease Other     Paternal & Maternal  . Hypertension Other     Paternal & Maternal  . Hypertension Brother     x3  . Hypertension Sister     #1  . Bipolar disorder Sister     #1  . ADD / ADHD  Son     x3  . Bipolar disorder Son     x3  . Asperger's syndrome Son    History  Substance Use Topics  . Smoking status: Current Every Day Smoker -- 1.00 packs/day for 23 years    Types: Cigarettes  . Smokeless tobacco: Never Used  . Alcohol Use: 0.0 oz/week    0 Standard drinks or equivalent per week     Comment: rare    Review of Systems  Respiratory: Positive for hemoptysis and shortness of breath. Negative for cough.   Gastrointestinal: Positive for nausea, vomiting and abdominal pain. Negative for diarrhea, constipation and blood in stool.  Genitourinary: Positive for hematuria. Negative for dysuria and frequency.  Skin: Negative for rash and wound.  All other systems reviewed and are negative.     Allergies  Atenolol; Other; and Tylenol  Home Medications   Prior to Admission medications   Medication Sig Start Date End  Date Taking? Authorizing Provider  albuterol (PROVENTIL HFA;VENTOLIN HFA) 108 (90 BASE) MCG/ACT inhaler Inhale 2 puffs into the lungs every 6 (six) hours as needed for wheezing or shortness of breath. 03/06/14  Yes Waldon Merl, PA-C  ALPRAZolam Prudy Feeler) 1 MG tablet Take 1 mg by mouth 3 (three) times daily.   Yes Historical Provider, MD  aspirin 325 MG tablet Take 1 tablet (325 mg total) by mouth daily. 09/04/13  Yes [name], MD  cholecalciferol (VITAMIN D) 1000 UNITS tablet Take 1,000 Units by mouth daily.   Yes Historical Provider, MD  cloNIDine (CATAPRES) 0.2 MG tablet Take 1 tablet (0.2 mg total) by mouth at bedtime. 03/06/14  Yes Waldon Merl, PA-C  Fluticasone-Salmeterol (ADVAIR) 250-50 MCG/DOSE AEPB Inhale 1 puff into the lungs every 12 (twelve) hours. 03/06/14  Yes Waldon Merl, PA-C  lisinopril (PRINIVIL,ZESTRIL) 20 MG tablet Take 1 tablet (20 mg total) by mouth daily. 03/06/14  Yes Waldon Merl, PA-C  omeprazole (PRILOSEC) 20 MG capsule Take 1 capsule (20 mg total) by mouth daily. 03/06/14  Yes Waldon Merl, PA-C  sitaGLIPtin-metformin (JANUMET) 50-500 MG per tablet Take 1 tablet by mouth 2 (two) times daily with a meal.   Yes Historical Provider, MD  tiZANidine (ZANAFLEX) 4 MG tablet Take 4 mg by mouth 4 (four) times daily - after meals and at bedtime.   Yes Historical Provider, MD  zolpidem (AMBIEN) 10 MG tablet Take 10 mg by mouth at bedtime as needed for sleep.   Yes Historical Provider, MD  ciprofloxacin (CIPRO) 250 MG tablet Take 1 tablet (250 mg total) by mouth every 12 (twelve) hours. 05/26/14   Charlestine Night, PA-C  citalopram (CELEXA) 10 MG tablet Take 1 tablet (10 mg total) by mouth daily. Patient not taking: Reported on 05/26/2014 03/06/14   Waldon Merl, PA-C  furosemide (LASIX) 20 MG tablet Take 1 tablet (20 mg total) by mouth daily. Patient not taking: Reported on 05/26/2014 03/06/14   Waldon Merl, PA-C  metFORMIN (GLUCOPHAGE) 500 MG tablet Take 1 tablet  (500 mg total) by mouth 2 (two) times daily with a meal. Patient not taking: Reported on 05/26/2014 03/06/14   Waldon Merl, PA-C  metoCLOPramide (REGLAN) 10 MG tablet Take 1 tablet (10 mg total) by mouth every 6 (six) hours. 05/26/14   Charlestine Night, PA-C  oxyCODONE (ROXICODONE) 5 MG immediate release tablet Take 1 tablet (5 mg total) by mouth every 4 (four) hours as needed for severe pain. 05/26/14   Charlestine Night, PA-C  pravastatin (  PRAVACHOL) 40 MG tablet Take 1 tablet (40 mg total) by mouth at bedtime. Patient not taking: Reported on 05/26/2014 03/06/14   Waldon Merl, PA-C   BP 129/70 mmHg  Pulse 78  Temp(Src) 98.6 F (37 C) (Oral)  Resp 22  SpO2 97% Physical Exam  Constitutional: He is oriented to person, place, and time. He appears well-developed and well-nourished.  Morbidly obese  HENT:  Head: Normocephalic.  Eyes: Pupils are equal, round, and reactive to light.  Neck: Normal range of motion.  Cardiovascular: Normal rate and regular rhythm.   Pulmonary/Chest: Effort normal and breath sounds normal.  Abdominal: Soft. He exhibits no distension. There is tenderness.    Musculoskeletal: He exhibits no edema or tenderness.  Neurological: He is alert and oriented to person, place, and time.  Skin: No rash noted.  Nursing note and vitals reviewed.   ED Course  Procedures (including critical care time) Labs Review Labs Reviewed  BASIC METABOLIC PANEL - Abnormal; Notable for the following:    Sodium 134 (*)    Glucose, Bld 119 (*)    GFR calc non Af Amer 89 (*)    All other components within normal limits  CBC WITH DIFFERENTIAL/PLATELET - Abnormal; Notable for the following:    Monocytes Relative 13 (*)    Monocytes Absolute 1.2 (*)    All other components within normal limits  URINALYSIS, ROUTINE W REFLEX MICROSCOPIC - Abnormal; Notable for the following:    Color, Urine ORANGE (*)    Bilirubin Urine MODERATE (*)    Urobilinogen, UA 4.0 (*)    Leukocytes,  UA TRACE (*)    All other components within normal limits  URINE RAPID DRUG SCREEN (HOSP PERFORMED) - Abnormal; Notable for the following:    Opiates POSITIVE (*)    Benzodiazepines POSITIVE (*)    All other components within normal limits  HEPATIC FUNCTION PANEL - Abnormal; Notable for the following:    AST 741 (*)    ALT 760 (*)    Total Bilirubin 2.1 (*)    Bilirubin, Direct 0.8 (*)    Indirect Bilirubin 1.3 (*)    All other components within normal limits  URINE MICROSCOPIC-ADD ON - Abnormal; Notable for the following:    Bacteria, UA FEW (*)    Casts HYALINE CASTS (*)    All other components within normal limits  I-STAT TROPOININ, ED  I-STAT CHEM 8, ED    Imaging Review Dg Chest 2 View (if Patient Has Fever And/or Copd)  05/26/2014   CLINICAL DATA:  Initial evaluation for acute shortness of breath  EXAM: CHEST  2 VIEW  COMPARISON:  Prior radiograph from 02/21/2014  FINDINGS: Cardiomegaly is stable from prior. Mediastinal silhouette within normal limits.  Lungs are hypoinflated. Mild perihilar vascular congestion. This may in part be related to shallow lung inflation. No overt pulmonary edema. No pleural effusion. No pneumothorax. No focal infiltrates identified.  No acute osseous abnormality.  IMPRESSION: 1. Stable cardiomegaly with mild perihilar vascular congestion without overt pulmonary edema. Please note that this finding may in part be related to shallow degree of lung inflation on this exam. 2. No other active cardiopulmonary disease.   Electronically Signed   By: Rise Mu M.D.   On: 05/26/2014 06:07     EKG Interpretation None      MDM   Final diagnoses:  Abdominal pain, unspecified abdominal location         Earley Favor, NP 05/26/14 1958  Paula Libra, MD  05/26/14 2242 

## 2014-05-26 NOTE — Discharge Instructions (Signed)
returm here as needed. Follow up with your doctor. You may a mild UTI.

## 2014-05-26 NOTE — ED Notes (Signed)
carafate given for sore throat

## 2014-06-07 ENCOUNTER — Emergency Department (HOSPITAL_COMMUNITY): Payer: Medicare Other

## 2014-06-07 ENCOUNTER — Emergency Department (HOSPITAL_COMMUNITY)
Admission: EM | Admit: 2014-06-07 | Discharge: 2014-06-08 | Disposition: A | Discharge status: Home / Self Care | Payer: Medicare Other | Attending: Emergency Medicine | Admitting: Emergency Medicine

## 2014-06-07 ENCOUNTER — Encounter (HOSPITAL_COMMUNITY): Payer: Self-pay | Admitting: Emergency Medicine

## 2014-06-07 DIAGNOSIS — F419 Anxiety disorder, unspecified: Secondary | ICD-10-CM | POA: Insufficient documentation

## 2014-06-07 DIAGNOSIS — Z7951 Long term (current) use of inhaled steroids: Secondary | ICD-10-CM | POA: Diagnosis not present

## 2014-06-07 DIAGNOSIS — Z23 Encounter for immunization: Secondary | ICD-10-CM | POA: Insufficient documentation

## 2014-06-07 DIAGNOSIS — Z720 Tobacco use: Secondary | ICD-10-CM | POA: Insufficient documentation

## 2014-06-07 DIAGNOSIS — E119 Type 2 diabetes mellitus without complications: Secondary | ICD-10-CM | POA: Insufficient documentation

## 2014-06-07 DIAGNOSIS — Z7982 Long term (current) use of aspirin: Secondary | ICD-10-CM | POA: Insufficient documentation

## 2014-06-07 DIAGNOSIS — S29001A Unspecified injury of muscle and tendon of front wall of thorax, initial encounter: Secondary | ICD-10-CM | POA: Diagnosis not present

## 2014-06-07 DIAGNOSIS — R079 Chest pain, unspecified: Secondary | ICD-10-CM | POA: Insufficient documentation

## 2014-06-07 DIAGNOSIS — Z79899 Other long term (current) drug therapy: Secondary | ICD-10-CM | POA: Insufficient documentation

## 2014-06-07 DIAGNOSIS — S3991XA Unspecified injury of abdomen, initial encounter: Secondary | ICD-10-CM | POA: Insufficient documentation

## 2014-06-07 DIAGNOSIS — F319 Bipolar disorder, unspecified: Secondary | ICD-10-CM | POA: Diagnosis not present

## 2014-06-07 DIAGNOSIS — Y929 Unspecified place or not applicable: Secondary | ICD-10-CM | POA: Insufficient documentation

## 2014-06-07 DIAGNOSIS — Y999 Unspecified external cause status: Secondary | ICD-10-CM | POA: Diagnosis not present

## 2014-06-07 DIAGNOSIS — S61216A Laceration without foreign body of right little finger without damage to nail, initial encounter: Secondary | ICD-10-CM | POA: Insufficient documentation

## 2014-06-07 DIAGNOSIS — G8929 Other chronic pain: Secondary | ICD-10-CM | POA: Insufficient documentation

## 2014-06-07 DIAGNOSIS — S61411A Laceration without foreign body of right hand, initial encounter: Secondary | ICD-10-CM

## 2014-06-07 DIAGNOSIS — W108XXA Fall (on) (from) other stairs and steps, initial encounter: Secondary | ICD-10-CM

## 2014-06-07 DIAGNOSIS — Y939 Activity, unspecified: Secondary | ICD-10-CM | POA: Insufficient documentation

## 2014-06-07 DIAGNOSIS — J45909 Unspecified asthma, uncomplicated: Secondary | ICD-10-CM | POA: Insufficient documentation

## 2014-06-07 DIAGNOSIS — E785 Hyperlipidemia, unspecified: Secondary | ICD-10-CM | POA: Diagnosis not present

## 2014-06-07 DIAGNOSIS — W109XXA Fall (on) (from) unspecified stairs and steps, initial encounter: Secondary | ICD-10-CM | POA: Diagnosis not present

## 2014-06-07 DIAGNOSIS — I10 Essential (primary) hypertension: Secondary | ICD-10-CM | POA: Diagnosis not present

## 2014-06-07 LAB — PROTIME-INR
INR: 1.03 (ref 0.00–1.49)
Prothrombin Time: 13.6 seconds (ref 11.6–15.2)

## 2014-06-07 LAB — COMPREHENSIVE METABOLIC PANEL
ALBUMIN: 3.6 g/dL (ref 3.5–5.2)
ALK PHOS: 73 U/L (ref 39–117)
ALT: 47 U/L (ref 0–53)
AST: 39 U/L — AB (ref 0–37)
Anion gap: 7 (ref 5–15)
BUN: <5 mg/dL — ABNORMAL LOW (ref 6–23)
CALCIUM: 9.2 mg/dL (ref 8.4–10.5)
CO2: 27 mmol/L (ref 19–32)
Chloride: 103 mmol/L (ref 96–112)
Creatinine, Ser: 0.75 mg/dL (ref 0.50–1.35)
GFR calc Af Amer: >90 mL/min (ref >90)
GFR calc non Af Amer: >90 mL/min (ref >90)
GLUCOSE: 122 mg/dL — AB (ref 70–99)
Potassium: 4.2 mmol/L (ref 3.5–5.1)
Sodium: 137 mmol/L (ref 135–145)
Total Bilirubin: 1 mg/dL (ref 0.3–1.2)
Total Protein: 8.1 g/dL (ref 6.0–8.3)

## 2014-06-07 LAB — CBC
HEMATOCRIT: 48 % (ref 39.0–52.0)
Hemoglobin: 16.6 g/dL (ref 13.0–17.0)
MCH: 31.9 pg (ref 26.0–34.0)
MCHC: 34.6 g/dL (ref 30.0–36.0)
MCV: 92.1 fL (ref 78.0–100.0)
Platelets: 259 10*3/uL (ref 150–400)
RBC: 5.21 MIL/uL (ref 4.22–5.81)
RDW: 13.8 % (ref 11.5–15.5)
WBC: 13.6 10*3/uL — ABNORMAL HIGH (ref 4.0–10.5)

## 2014-06-07 LAB — SAMPLE TO BLOOD BANK

## 2014-06-07 LAB — ETHANOL

## 2014-06-07 LAB — CBG MONITORING, ED: Glucose-Capillary: 134 mg/dL — ABNORMAL HIGH (ref 70–99)

## 2014-06-07 MED ORDER — HYDROMORPHONE HCL 1 MG/ML IJ SOLN
1.0000 mg | Freq: Once | INTRAMUSCULAR | Status: AC
  Administered 2014-06-07: 1 mg via INTRAMUSCULAR

## 2014-06-07 MED ORDER — SODIUM CHLORIDE 0.9 % IV BOLUS (SEPSIS)
1000.0000 mL | Freq: Once | INTRAVENOUS | Status: AC
  Administered 2014-06-07: 1000 mL via INTRAVENOUS

## 2014-06-07 MED ORDER — LIDOCAINE HCL (PF) 1 % IJ SOLN
10.0000 mL | Freq: Once | INTRAMUSCULAR | Status: AC
Start: 2014-06-07 — End: 2014-06-07
  Administered 2014-06-07: 10 mL
  Filled 2014-06-07: qty 10

## 2014-06-07 MED ORDER — HYDROMORPHONE HCL 1 MG/ML IJ SOLN
1.0000 mg | Freq: Once | INTRAMUSCULAR | Status: DC
  Filled 2014-06-07: qty 1

## 2014-06-07 MED ORDER — OXYCODONE HCL 5 MG PO TABS
5.0000 mg | ORAL_TABLET | ORAL | Status: DC | PRN
Start: 2014-06-07 — End: 2015-03-30

## 2014-06-07 MED ORDER — LIDOCAINE HCL 2 % IJ SOLN: 10.0000 mL | Freq: Once | INTRAMUSCULAR | Status: DC

## 2014-06-07 MED ORDER — TETANUS-DIPHTH-ACELL PERTUSSIS 5-2.5-18.5 LF-MCG/0.5 IM SUSP
0.5000 mL | Freq: Once | INTRAMUSCULAR | Status: AC
  Administered 2014-06-07: 0.5 mL via INTRAMUSCULAR
  Filled 2014-06-07: qty 0.5

## 2014-06-07 MED ORDER — IOHEXOL 300 MG/ML  SOLN
100.0000 mL | Freq: Once | INTRAMUSCULAR | Status: AC | PRN
  Administered 2014-06-07: 100 mL via INTRAVENOUS

## 2014-06-07 MED ORDER — HYDROMORPHONE HCL 1 MG/ML IJ SOLN
INTRAMUSCULAR | Status: AC
  Filled 2014-06-07: qty 1

## 2014-06-07 NOTE — ED Notes (Signed)
Patient with fall down flight of stairs.  Patient has GCS of 14.  Patient does have injury to right hand.  Patient is confused to time and date.

## 2014-06-07 NOTE — ED Notes (Signed)
Pt fell [place] a flight of stairs at an apartment complex. Pt is tender over the entirety of the back and abdomen. Pain at the right shoulder, right ribcage, right hand with a 2 cm lac to hand that's hemostatic.

## 2014-06-07 NOTE — ED Provider Notes (Signed)
CSN: [id number]     Arrival date & time 06/07/14  2049 History   First MD Initiated Contact with Patient 06/07/14 2122     Chief Complaint  Patient presents with  . Fall     (Consider location/radiation/quality/duration/timing/severity/associated sxs/prior Treatment) Patient is a 39 y.o. male presenting with fall. The history is provided by the patient. No language interpreter was used.  Fall This is a new problem. The current episode started today. The problem occurs rarely. The problem has been resolved. Associated symptoms include abdominal pain, arthralgias and chest pain. Pertinent negatives include no congestion, coughing, diaphoresis, fatigue, fever, headaches, myalgias, nausea, numbness, rash, vertigo, vomiting or weakness. Nothing aggravates the symptoms. He has tried nothing for the symptoms. The treatment provided no relief.    Past Medical History  Diagnosis Date  . Heart valve disorder     s/p echocardiogram  . Hypertension   . Asthma   . Diabetes mellitus   . Hyperlipidemia   . Anaphylactic reaction   . Bipolar disorder   . Adult ADHD   . Morbidly obese   . Transient cerebral ischemia     Unknown  . OSA (obstructive sleep apnea)   . Anxiety    Past Surgical History  Procedure Laterality Date  . Carpal tunnel release    . Nose surgery    . Wisdom tooth extraction    . Tooth extraction     Family History  Problem Relation Age of Onset  . CAD Mother     Living  . Diabetes Mellitus II Mother   . Stroke Mother   . Hypertension Mother   . Congestive Heart Failure Mother   . Kidney disease Mother   . Fibromyalgia Mother   . Thyroid disease Mother   . Hyperlipidemia Mother   . Liver disease Mother   . Alcoholism Father 26    Deceased  . Arthritis Maternal Grandmother   . Congestive Heart Failure Maternal Grandmother   . Hypertension Maternal Grandmother   . Lung cancer Maternal Grandfather   . Colon cancer Maternal Aunt   . Stomach cancer Maternal Aunt    . Heart disease Other     Paternal & Maternal  . Hypertension Other     Paternal & Maternal  . Hypertension Brother     x3  . Hypertension Sister     #1  . Bipolar disorder Sister     #1  . ADD / ADHD Son     x3  . Bipolar disorder Son     x3  . Asperger's syndrome Son    History  Substance Use Topics  . Smoking status: Current Every Day Smoker -- 1.00 packs/day for 23 years    Types: Cigarettes  . Smokeless tobacco: Never Used  . Alcohol Use: 0.0 oz/week    0 Standard drinks or equivalent per week     Comment: rare    Review of Systems  Constitutional: Negative for fever, diaphoresis and fatigue.  HENT: Negative for congestion.   Respiratory: Negative for cough, chest tightness and shortness of breath.   Cardiovascular: Positive for chest pain.  Gastrointestinal: Positive for abdominal pain. Negative for nausea and vomiting.  Musculoskeletal: Positive for arthralgias. Negative for myalgias.  Skin: Positive for wound. Negative for rash.  Neurological: Negative for vertigo, weakness, light-headedness, numbness and headaches.  Psychiatric/Behavioral: Negative for confusion.  All other systems reviewed and are negative.     Allergies  Atenolol; Other; and Tylenol  Home Medications  Prior to Admission medications   Medication Sig Start Date End Date Taking? Authorizing Provider  albuterol (PROVENTIL HFA;VENTOLIN HFA) 108 (90 BASE) MCG/ACT inhaler Inhale 2 puffs into the lungs every 6 (six) hours as needed for wheezing or shortness of breath. 03/06/14   Waldon Merl, PA-C  ALPRAZolam Prudy Feeler) 1 MG tablet Take 1 mg by mouth 3 (three) times daily.    Historical Provider, MD  aspirin 325 MG tablet Take 1 tablet (325 mg total) by mouth daily. 09/04/13   [name], MD  cholecalciferol (VITAMIN D) 1000 UNITS tablet Take 1,000 Units by mouth daily.    Historical Provider, MD  ciprofloxacin (CIPRO) 250 MG tablet Take 1 tablet (250 mg total) by mouth every 12 (twelve)  hours. 05/26/14   Charlestine Night, PA-C  citalopram (CELEXA) 10 MG tablet Take 1 tablet (10 mg total) by mouth daily. Patient not taking: Reported on 05/26/2014 03/06/14   Waldon Merl, PA-C  cloNIDine (CATAPRES) 0.2 MG tablet Take 1 tablet (0.2 mg total) by mouth at bedtime. 03/06/14   Waldon Merl, PA-C  Fluticasone-Salmeterol (ADVAIR) 250-50 MCG/DOSE AEPB Inhale 1 puff into the lungs every 12 (twelve) hours. 03/06/14   Waldon Merl, PA-C  furosemide (LASIX) 20 MG tablet Take 1 tablet (20 mg total) by mouth daily. Patient not taking: Reported on 05/26/2014 03/06/14   Waldon Merl, PA-C  lisinopril (PRINIVIL,ZESTRIL) 20 MG tablet Take 1 tablet (20 mg total) by mouth daily. 03/06/14   Waldon Merl, PA-C  metFORMIN (GLUCOPHAGE) 500 MG tablet Take 1 tablet (500 mg total) by mouth 2 (two) times daily with a meal. Patient not taking: Reported on 05/26/2014 03/06/14   Waldon Merl, PA-C  metoCLOPramide (REGLAN) 10 MG tablet Take 1 tablet (10 mg total) by mouth every 6 (six) hours. 05/26/14   Charlestine Night, PA-C  omeprazole (PRILOSEC) 20 MG capsule Take 1 capsule (20 mg total) by mouth daily. 03/06/14   Waldon Merl, PA-C  oxyCODONE (ROXICODONE) 5 MG immediate release tablet Take 1 tablet (5 mg total) by mouth every 4 (four) hours as needed for severe pain. 05/26/14   Charlestine Night, PA-C  pravastatin (PRAVACHOL) 40 MG tablet Take 1 tablet (40 mg total) by mouth at bedtime. Patient not taking: Reported on 05/26/2014 03/06/14   Waldon Merl, PA-C  sitaGLIPtin-metformin (JANUMET) 50-500 MG per tablet Take 1 tablet by mouth 2 (two) times daily with a meal.    Historical Provider, MD  tiZANidine (ZANAFLEX) 4 MG tablet Take 4 mg by mouth 4 (four) times daily - after meals and at bedtime.    Historical Provider, MD  zolpidem (AMBIEN) 10 MG tablet Take 10 mg by mouth at bedtime as needed for sleep.    Historical Provider, MD   BP 142/90 mmHg  Pulse 92  Temp(Src) 98.6 F (37 C) (Oral)   Resp 20  Ht  (1.88 m)  Wt 400 lb (181.439 kg)  BMI 51.34 kg/m2  SpO2 95%   Physical Exam  Constitutional: He is oriented to person, place, and time. Vital signs are normal. He appears well-developed and well-nourished.  Non-toxic appearance. No distress.  HENT:  Head: Normocephalic and atraumatic.  Nose: Nose normal.  Mouth/Throat: Oropharynx is clear and moist. No oropharyngeal exudate.  Eyes: EOM are normal. Pupils are equal, round, and reactive to light.  Neck: Normal range of motion. Neck supple.  Cardiovascular: Normal rate, regular rhythm, normal heart sounds and intact distal pulses.   No murmur heard.  Pulmonary/Chest: Effort normal and breath sounds normal. No respiratory distress. He has no wheezes. He exhibits no tenderness.  Abdominal: Soft. He exhibits no distension. There is no tenderness. There is no guarding.  Morbidly obese.  Diffusely tender to palpation   Musculoskeletal: Normal range of motion. He exhibits no tenderness.       Hands: Neurological: He is alert and oriented to person, place, and time. No cranial nerve deficit. Coordination normal.  Alert and oriented x 3. Moving all extremities. GCS 15.    Skin: Skin is warm and dry. He is not diaphoretic. No pallor.     Psychiatric: His behavior is normal. Judgment and thought content normal. His mood appears anxious.  Nursing note and vitals reviewed.   ED Course  LACERATION REPAIR Date/Time: 06/08/2014 12:27 AM Performed by: Lenell AntuWRIGHT, Shantella Blubaugh Authorized by: Lenell AntuWRIGHT, Marshe Shrestha Consent: Verbal consent obtained. Risks and benefits: risks, benefits and alternatives were discussed Consent given by: patient Required items: required blood products, implants, devices, and special equipment available Patient identity confirmed: verbally with patient Time out: Immediately prior to procedure a "time out" was called to verify the correct patient, procedure, equipment, support staff and site/side marked as required. Body  area: upper extremity Location details: right small finger Laceration length: 3 cm Foreign bodies: no foreign bodies Tendon involvement: none Nerve involvement: none Vascular damage: no Anesthesia: local infiltration Local anesthetic: lidocaine 1% with epinephrine Anesthetic total: 5 ml Patient sedated: no Preparation: Patient was prepped and draped in the usual sterile fashion. Irrigation solution: saline Irrigation method: syringe Amount of cleaning: extensive Debridement: none Degree of undermining: none Skin closure: 4-0 Prolene Number of sutures: 7 Technique: simple Approximation: close Approximation difficulty: simple Dressing: 4x4 sterile gauze and antibiotic ointment Patient tolerance: Patient tolerated the procedure well with no immediate complications   (including critical care time) Labs Review Labs Reviewed  CDS SEROLOGY  COMPREHENSIVE METABOLIC PANEL  CBC  ETHANOL  PROTIME-INR  SAMPLE TO BLOOD BANK    Imaging Review No results found.   EKG Interpretation   Date/Time:  Friday June 07 2014 23:13:16 EDT Ventricular Rate:  85 PR Interval:  158 QRS Duration: 89 QT Interval:  375 QTC Calculation: 446 R Axis:   91 Text Interpretation:  Sinus rhythm Borderline right axis deviation No  significant change since last tracing Confirmed by YAO  MD, DAVID ([id number])  on 06/07/2014 11:16:19 PM      MDM   Final diagnoses:  Fall down steps, initial encounter  Chronic pain  Hand laceration, right, initial encounter    Pt is a 39 yo M with hx of morbid obesity, anxiety, depression, chronic narcotic use, and DM who presents after a fall.  Reports he was at a friend's house when he fell down steep concrete apartment steps.  Thinks he fell down approximately 9 steps.  Unclear if he lost his balance or if he syncopized.  Patient reports he doesn't know why he fell, but denies feeling any chest pain or SOB prior to the event.  Denies drug use or EtOH prior to the  event.  It was unwitnessed.  He reports + LOC and dizziness after.   Cut his right hand, and wife reports significant blood loss, however patient is HDS and only a small amount of blood was seen on the towel.    Has a right hand lac (2 cm to the hypothenar eminence), hemostatic.  Tenderness to lateral neck, midline lower T/L spine, right shoulder, right lateral ribs, diffusely on abdomen.  Reportedly was confused and with GCS of 14 on arrival to triage, but was GCS 15 when he was brought back to a bed.  Able to ambulate out of the wheelchair and into the bed.  Airway intact.  No evidence of trauma except for his hand lac.  Right hand was NVI distally, no concern for tendon or nerve involvement based on exam.   Provided with pain meds, IVF, and tetanus.  CT head, c-spine, abd/pelvis with t/l reformats, xrays of right hand, chest and pelvis xrays ordered to evaluate due to mechanism.  Patient then complained of diffuse chest pain SOB.  Worse with movement and palpation.  No hypoxia or change in vitals.  Lungs clear.  EKG unchanged from previous.  Likely 2/2 the trauma. .  Patient lost his IV several times, so received pain meds IM during this ED visit.   All images returned benign.  Patient informed and he voiced understanding.  Pain likely from contusions but no acute emergent trauma.  His right hypothenar laceration was closed at ED bedside. It was irrigated with saline, numbed with lidocaine w/ epi, then closed with 7 x 4-0 prolene simple interrupted sutures.  Patient tolerated it without complication.  Ok to [employer] home at this time.  He was given instructions on concussion precautions.  Advised to try tylenol for pain and given an Rx for 6x roxicodone to help over the weekend.  Advised that he will need to see his PCP for long term narcotics.  Brief Epic review shows several notes that state concern for possible narcotic and anti-anxiety medication abuse.  He was warned to minimize his narcotic use and  discussed max dosing of tylenol.  Given instructions on wound care and advised to have the sutures removed in 7 days.  All questions were answered and pt was discharged in stable condition.    Angiocath insertion Performed by: Lenell Antu  Consent: Verbal consent obtained. Risks and benefits: risks, benefits and alternatives were discussed Time out: Immediately prior to procedure a "time out" was called to verify the correct patient, procedure, equipment, support staff and site/side marked as required. Preparation: Patient was prepped and draped in the usual sterile fashion. Vein Location: left AC Ultrasound Guided Gauge: 20# Normal blood return and flush without difficulty Patient tolerance: Patient tolerated the procedure well with no immediate complications.   ED ECG REPORT   Date: 06/08/2014  Rate: 91 bpm   Rhythm: normal sinus rhythm  QRS Axis: normal  Intervals: normal  ST/T Wave abnormalities: normal  Conduction Disutrbances:none  Narrative Interpretation: benign EKG  Old EKG Reviewed: unchanged  I have personally reviewed the EKG tracing and agree with the computerized printout as noted.   Patient was seen with ED Attending, Dr. Skip Estimable, MD   Lenell Antu, MD 06/08/14 0045  Richardean Canal, MD 06/10/14 [phone]

## 2014-06-07 NOTE — ED Notes (Signed)
Pt called out reporting that he was feeling short of breath. This RN went into the room, pt in NAD, satting 96% on room air. This RN placed the pt on 2L nasal cannula, sat the head of the bed up higher, and took and EKG. Percell LocusYao and Wright, MD notified, and told this RN to give the pt his second dose of dilaudid.

## 2014-06-08 NOTE — Progress Notes (Signed)
Chaplain responded to level 2 trauma.  Partner, Teresa Richmond, (common-law wife, together 23 years) was outside patient room. Following initial contact, provided drink to Teresa.  Met patient who appeared extremely anxious and expressed feeling upset and in pain.  Offered support and provided orientation to services.      06/08/14 0500  Clinical Encounter Type  Visited With Patient and family together  Visit Type Initial;Psychological support   

## 2014-07-11 ENCOUNTER — Other Ambulatory Visit: Payer: Self-pay | Admitting: Geriatric Medicine

## 2014-07-11 DIAGNOSIS — M4606 Spinal enthesopathy, lumbar region: Secondary | ICD-10-CM

## 2014-07-11 DIAGNOSIS — M5116 Intervertebral disc disorders with radiculopathy, lumbar region: Secondary | ICD-10-CM

## 2014-07-11 DIAGNOSIS — M4056 Lordosis, unspecified, lumbar region: Secondary | ICD-10-CM

## 2014-07-11 DIAGNOSIS — M5412 Radiculopathy, cervical region: Secondary | ICD-10-CM

## 2014-07-11 DIAGNOSIS — E6609 Other obesity due to excess calories: Secondary | ICD-10-CM

## 2014-07-11 DIAGNOSIS — M542 Cervicalgia: Secondary | ICD-10-CM

## 2014-07-22 IMAGING — CT CT ABD-PELV W/ CM
2 of 5 series · 17 of 46 positions shown, 19 images · IV contrast (APPLIED)
Comparison: 06/19/2011 and 09/10/2009

CLINICAL DATA: Abdominal pain right lower quadrant with radiation
to the inguinal region.  Vomiting.

CT ABDOMEN AND PELVIS WITH CONTRAST
TECHNIQUE: Multidetector CT imaging of the abdomen and pelvis was
performed following the standard protocol during bolus
administration of intravenous contrast.
Contrast: 50mL OMNIPAQUE IOHEXOL 300 MG/ML  SOLN, 100mL OMNIPAQUE
IOHEXOL 300 MG/ML  SOLN

[Series 2: abd/pelvis 5.0 b31f · axial · 0.98mm/px · z∈[+738,+1203]mm · 14 of 105 slices shown, 16 images]
[im 6/105  soft-tissue]
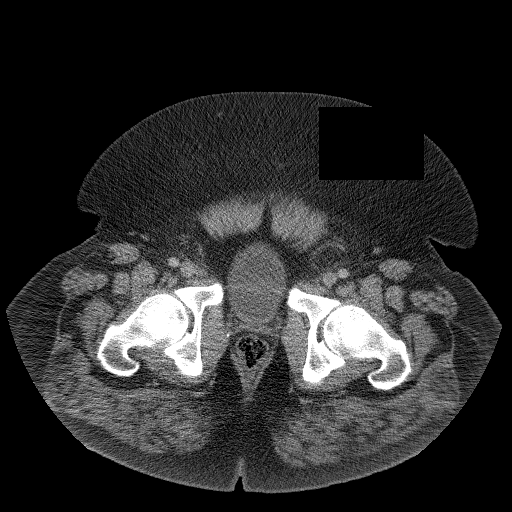
[im 6/105  bone]
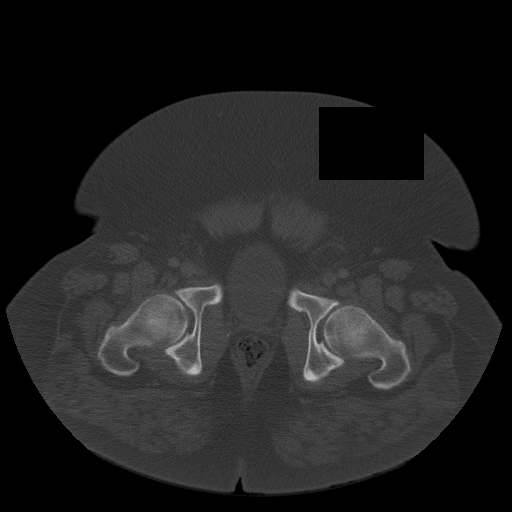
[im 11/105  soft-tissue]
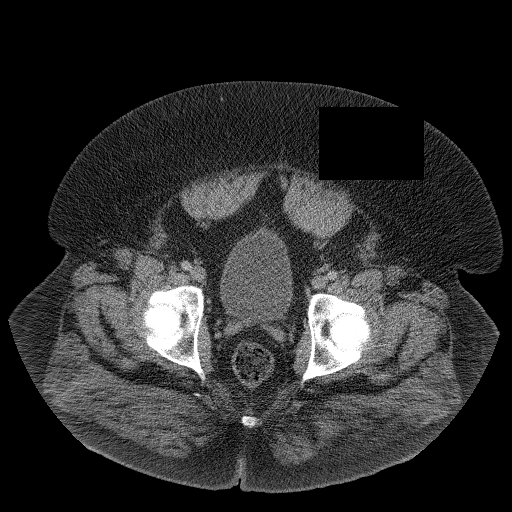
[im 22/105  soft-tissue]
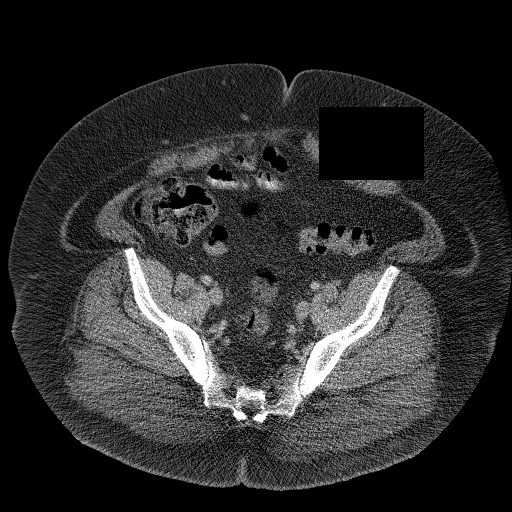
[im 28/105  soft-tissue]
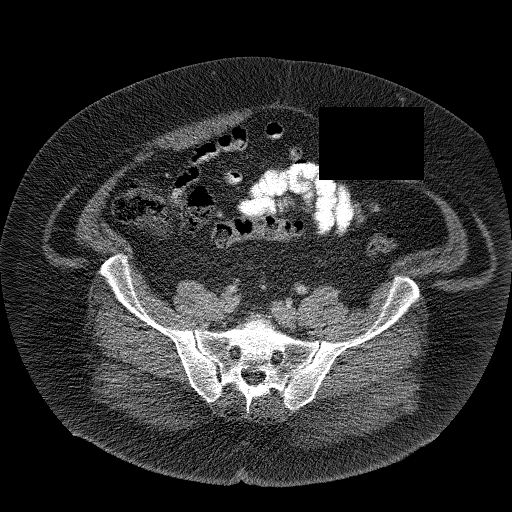
[im 33/105  soft-tissue]
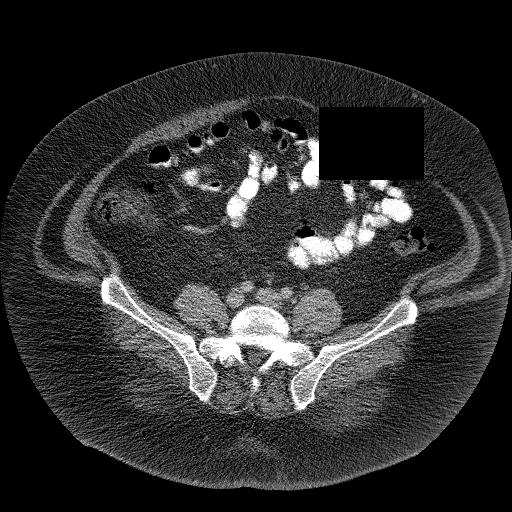
[im 44/105  soft-tissue]
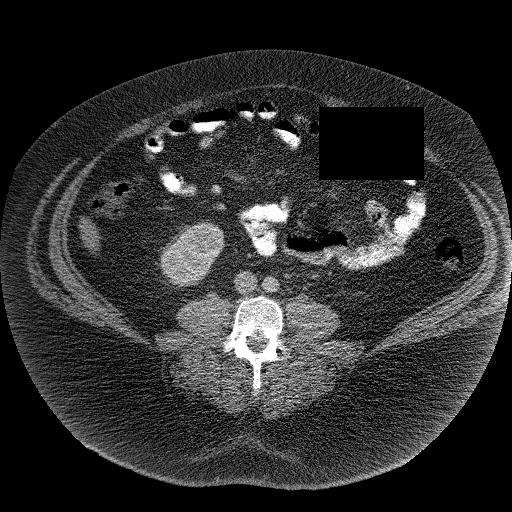
[im 50/105  soft-tissue]
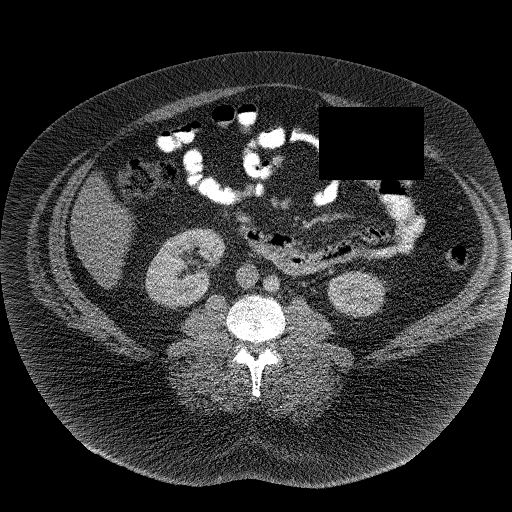
[im 55/105  soft-tissue]
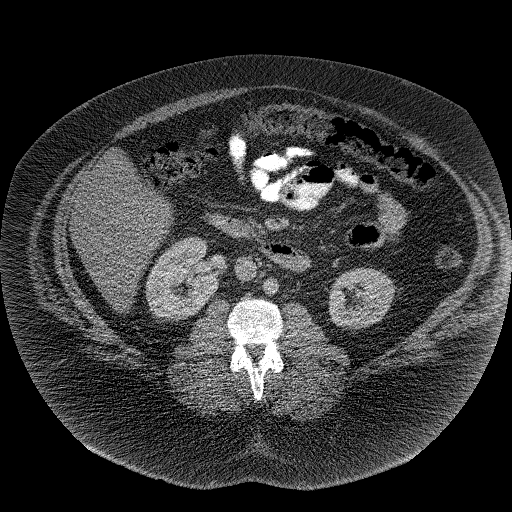
[im 61/105  soft-tissue]
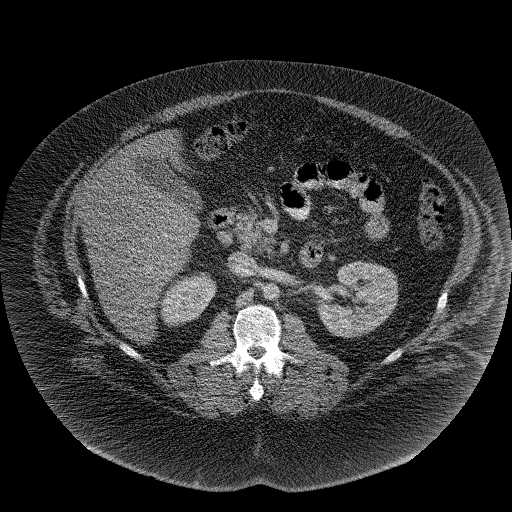
[im 61/105  bone]
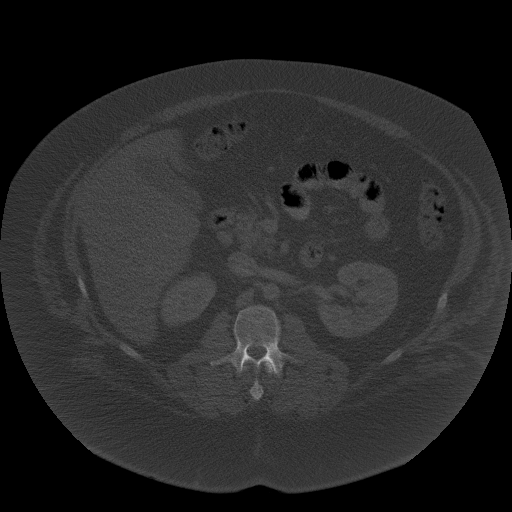
[im 72/105  soft-tissue]
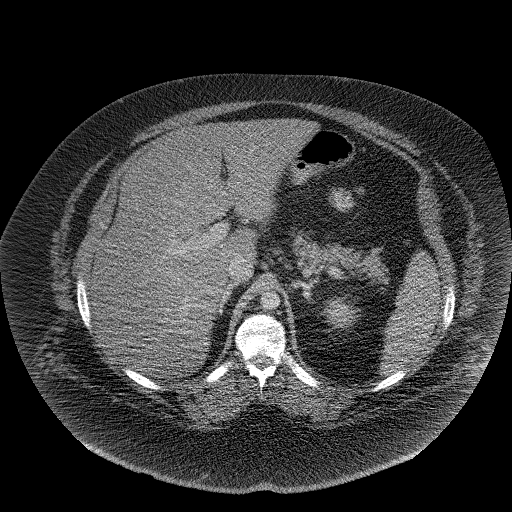
[im 77/105  soft-tissue]
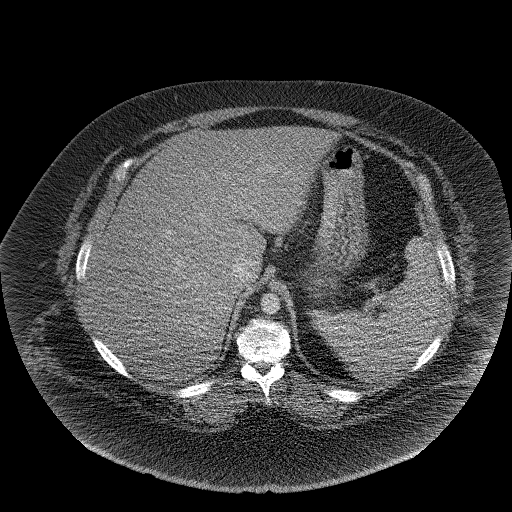
[im 83/105  soft-tissue]
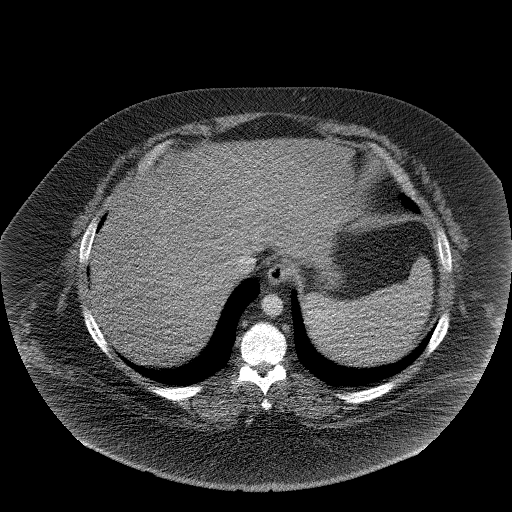
[im 94/105  soft-tissue]
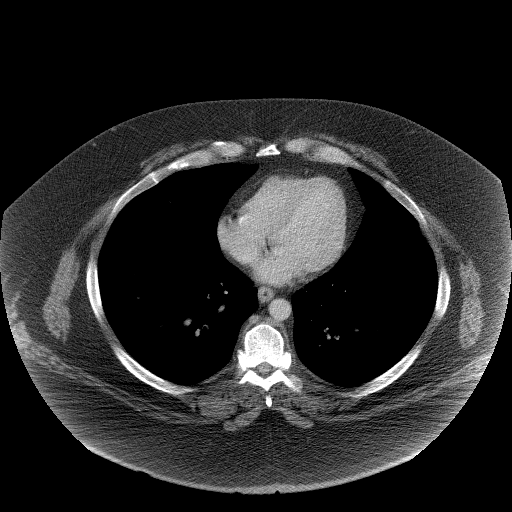
[im 99/105  soft-tissue]
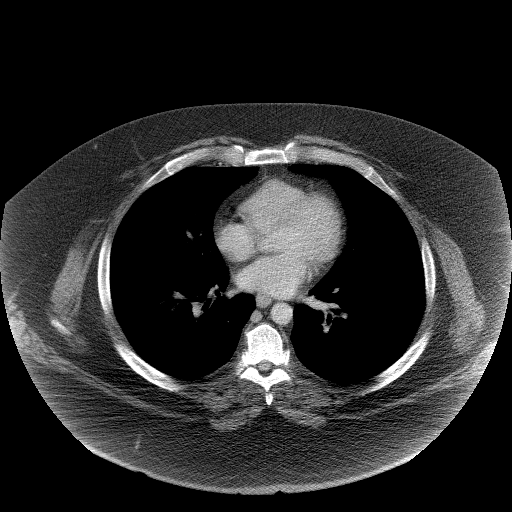

[Series 5: abd/pelvis 3.0 coronal · coronal · 1.10mm/px · 3 of 128 slices shown]
[im 43/128  soft-tissue]
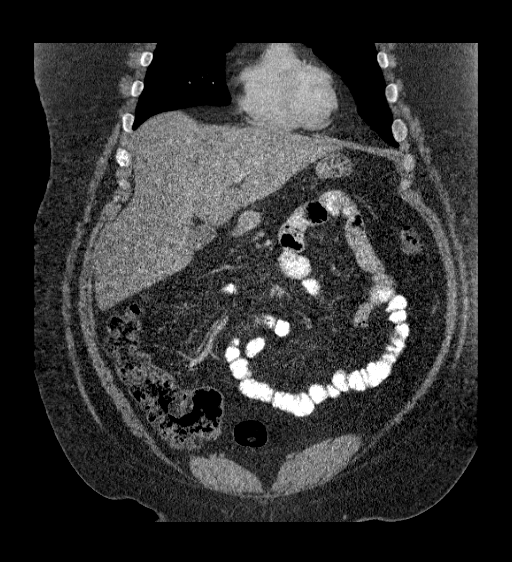
[im 57/128  soft-tissue]
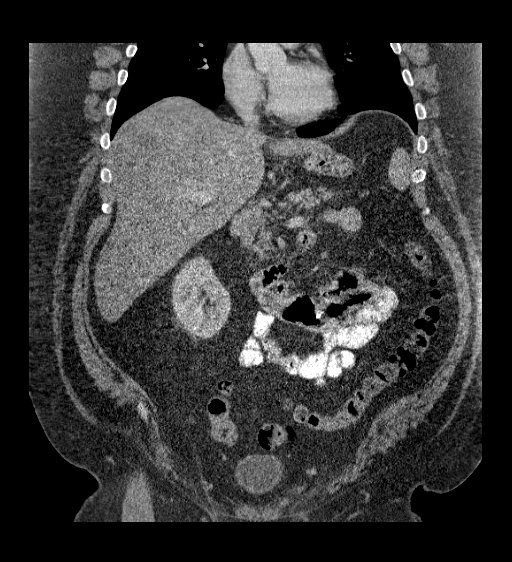
[im 71/128  soft-tissue]
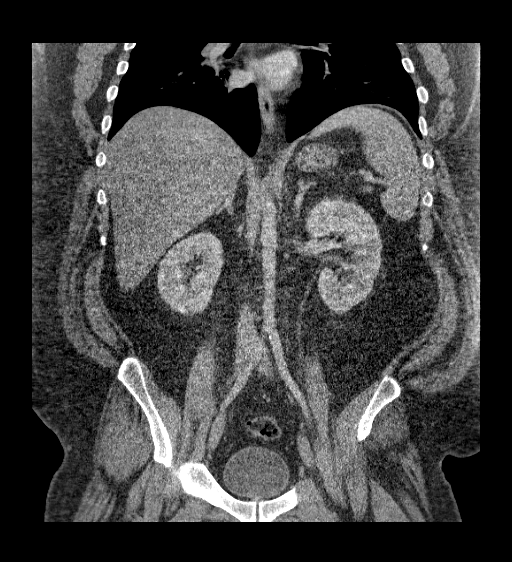

[17 of 46 positions shown; findings below may reference images not displayed]

FINDINGS: Lung bases demonstrate a 4-5 mm nodular density over the
right middle lobe unchanged from 9077.  There is a 7 mm nodule over
the posterior right base unchanged.

Abdominal images demonstrate a normal liver, spleen, gallbladder,
pancreas and adrenal glands.  Kidneys are normal in size without
evidence of hydronephrosis or nephrolithiasis.  Ureters are within
normal.  The appendix is normal.  There is no free fluid or focal
inflammatory change.

Pelvic images demonstrate no focal abnormality.  Remaining bones
and soft tissues are within normal.
IMPRESSION: No acute findings in the abdomen/pelvis.

Two small nodules of the right middle and lower lobe unchanged from

## 2014-08-17 ENCOUNTER — Ambulatory Visit
Admission: RE | Admit: 2014-08-17 | Discharge: 2014-08-17 | Disposition: A | Discharge status: Home / Self Care | Payer: Medicare Other | Source: Ambulatory Visit | Attending: Geriatric Medicine | Admitting: Geriatric Medicine

## 2014-08-17 DIAGNOSIS — M5412 Radiculopathy, cervical region: Secondary | ICD-10-CM

## 2014-08-17 DIAGNOSIS — M4606 Spinal enthesopathy, lumbar region: Secondary | ICD-10-CM

## 2014-08-17 DIAGNOSIS — M542 Cervicalgia: Secondary | ICD-10-CM

## 2014-08-17 DIAGNOSIS — E6609 Other obesity due to excess calories: Secondary | ICD-10-CM

## 2014-08-17 DIAGNOSIS — M5116 Intervertebral disc disorders with radiculopathy, lumbar region: Secondary | ICD-10-CM

## 2014-08-17 DIAGNOSIS — M4056 Lordosis, unspecified, lumbar region: Secondary | ICD-10-CM

## 2014-08-17 IMAGING — CT CT ABD-PELV W/ CM
2 of 4 series · 17 of 46 positions shown, 19 images · IV contrast (APPLIED)
Comparison: CT abdomen and pelvis 10/22/2012, 12/13/2010 and
09/18/2008.

CLINICAL DATA: Right upper quadrant pain for 8 hrs. Nausea and
vomiting.

EXAM:
CT ABDOMEN AND PELVIS WITH CONTRAST
TECHNIQUE: Multidetector CT imaging of the abdomen and pelvis was performed
using the standard protocol following bolus administration of
intravenous contrast.
CONTRAST:  100mL OMNIPAQUE IOHEXOL 300 MG/ML  SOLN

[Series 2: abd/ pelvis 5.0 i30f 1 · axial · 0.97mm/px · z∈[-862,-402]mm · 14 of 102 slices shown, 16 images]
[im 5/102  soft-tissue]
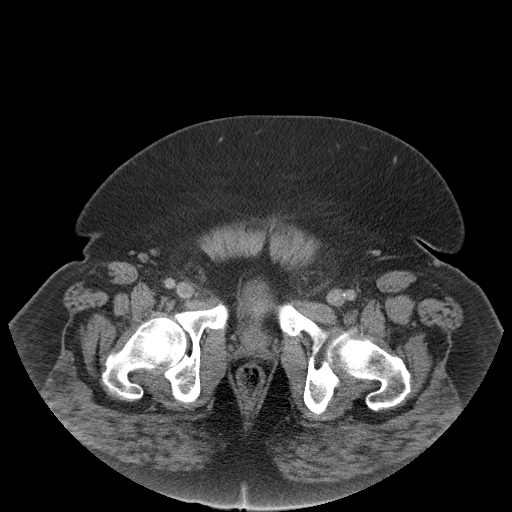
[im 5/102  bone]
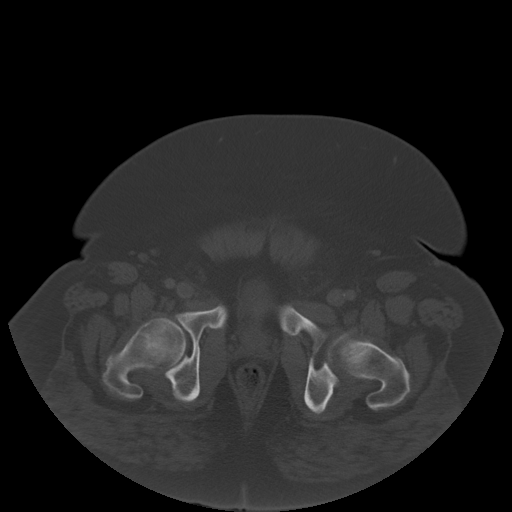
[im 13/102  soft-tissue]
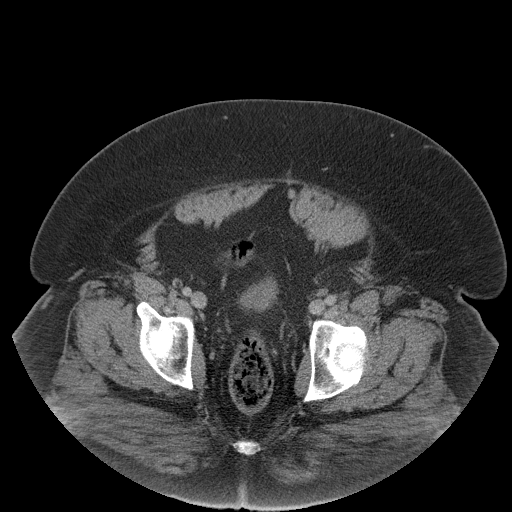
[im 22/102  soft-tissue]
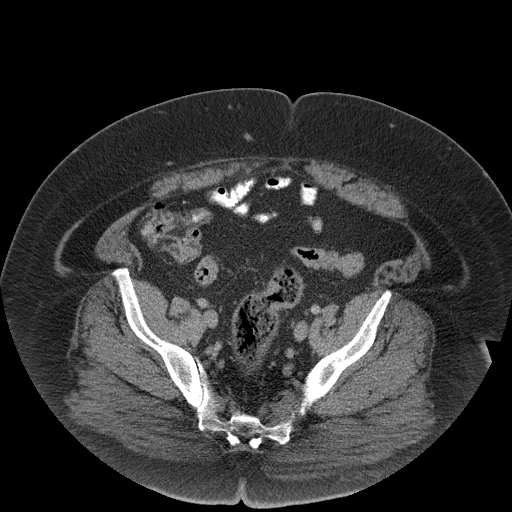
[im 26/102  soft-tissue]
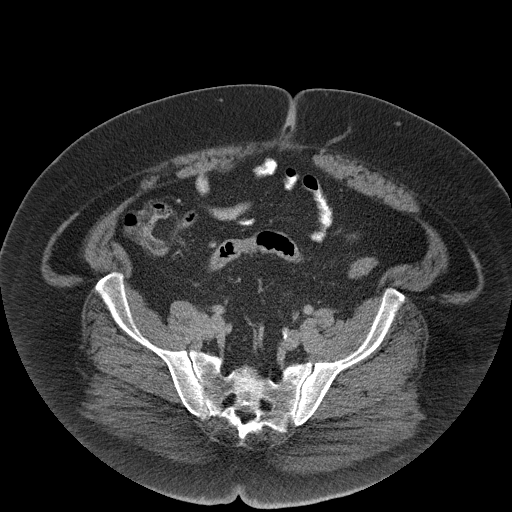
[im 34/102  soft-tissue]
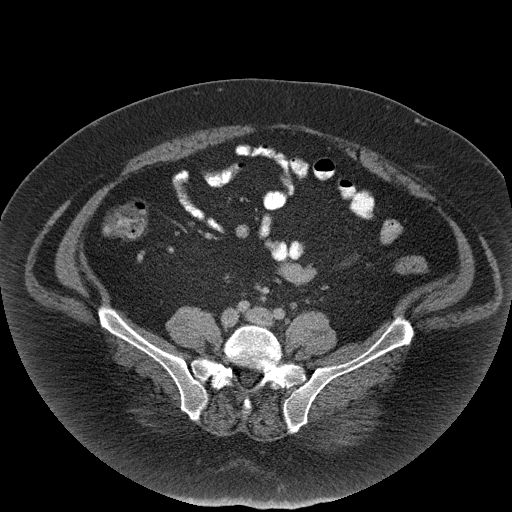
[im 43/102  soft-tissue]
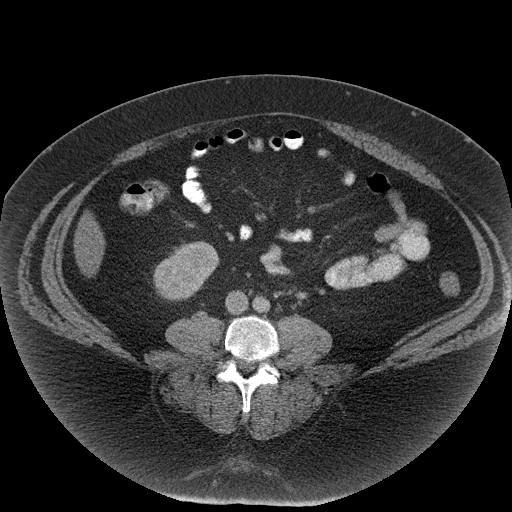
[im 47/102  soft-tissue]
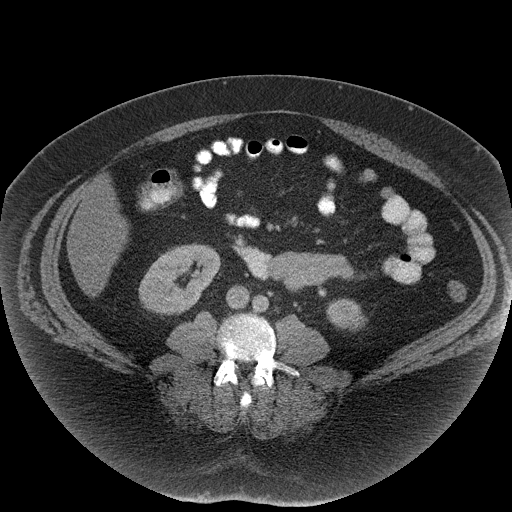
[im 55/102  soft-tissue]
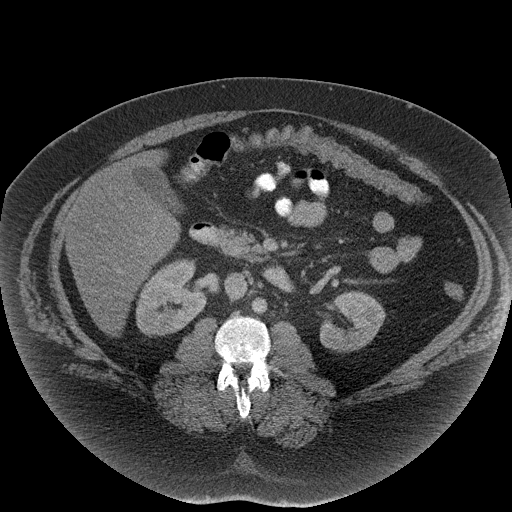
[im 59/102  soft-tissue]
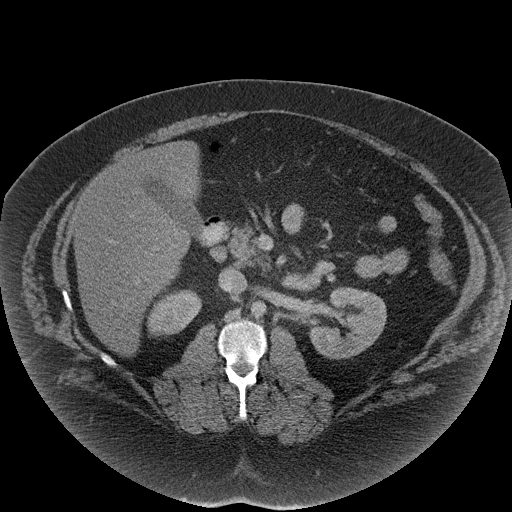
[im 59/102  bone]
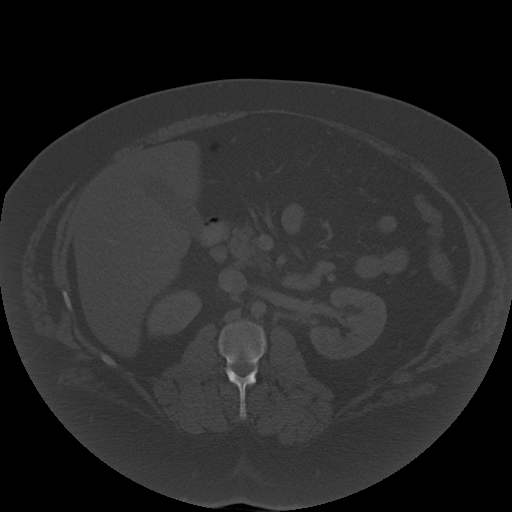
[im 68/102  soft-tissue]
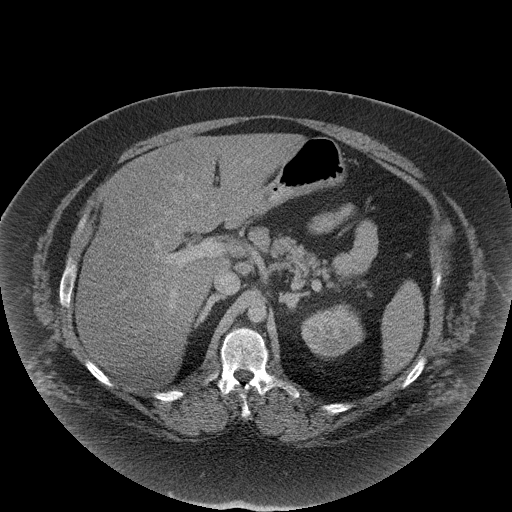
[im 76/102  soft-tissue]
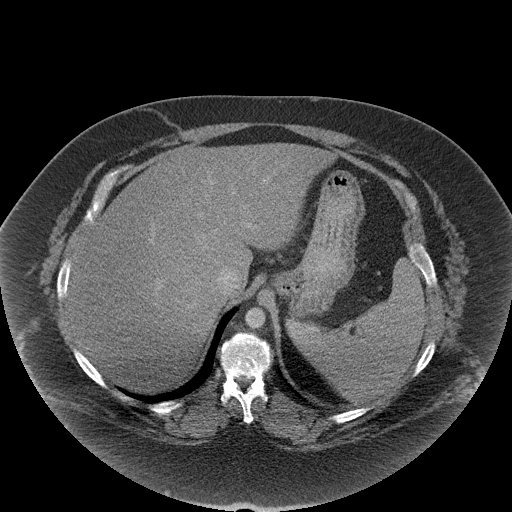
[im 80/102  soft-tissue]
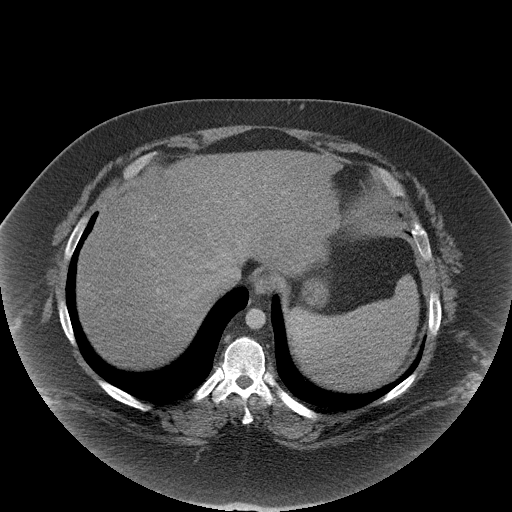
[im 89/102  soft-tissue]
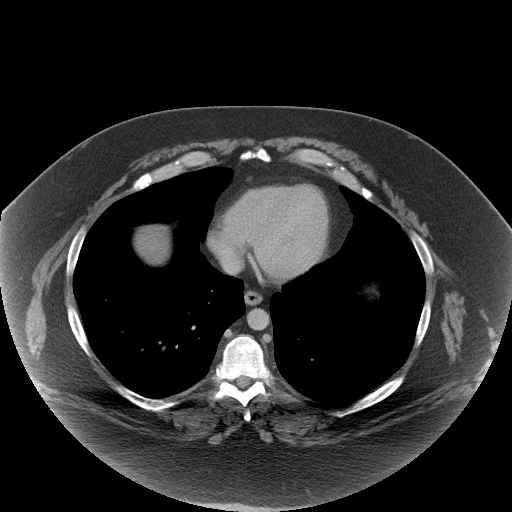
[im 97/102  soft-tissue]
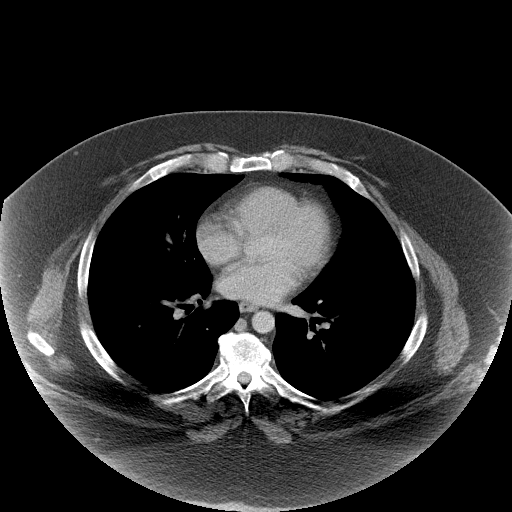

[Series 5: cor · coronal · 1.06mm/px · 3 of 179 slices shown]
[im 60/179  soft-tissue]
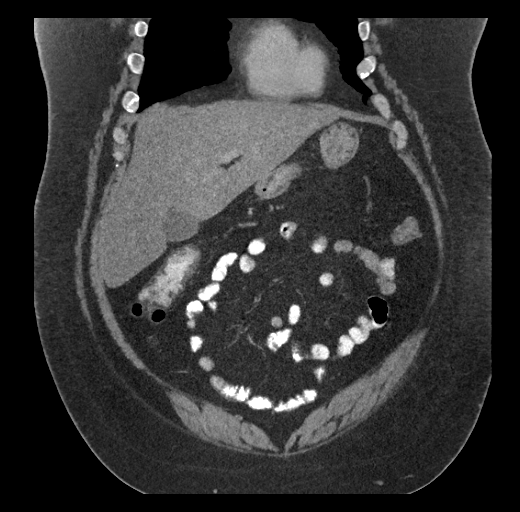
[im 80/179  soft-tissue]
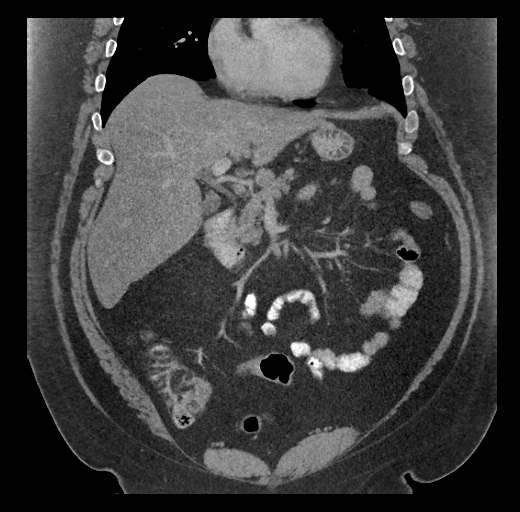
[im 99/179  soft-tissue]
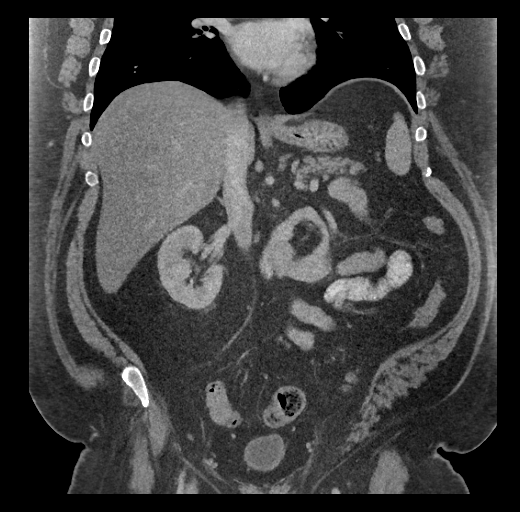

[17 of 46 positions shown; findings below may reference images not displayed]

FINDINGS: The patient has a 0.4 cm right lower lobe pulmonary nodule on image
6 and a tiny subpleural nodule in the right middle lobe on image 4,
both unchanged from 4181. There is no pleural or pericardial
effusion. Heart size is normal.

The liver is low attenuating consistent with fatty infiltration. No
focal liver lesion is identified. The gallbladder, spleen, adrenal
glands, pancreas and kidneys appear normal. The stomach, small large
bowel and appendix appear normal. No lymphadenopathy or fluid is
identified. No focal bony abnormality.
IMPRESSION: No acute finding or finding to explain the patient's symptoms.

Fatty infiltration of the liver.

## 2014-08-17 IMAGING — US US ABDOMEN COMPLETE
1 series · 14 of 25 positions shown · non-contrast
Comparison: None.

CLINICAL DATA: Abdominal pain.

EXAM:
ABDOMEN ULTRASOUND

[Series 1: us abdomen complete · 0.32mm/px · 14 of 80 slices shown]
[im 1/80]
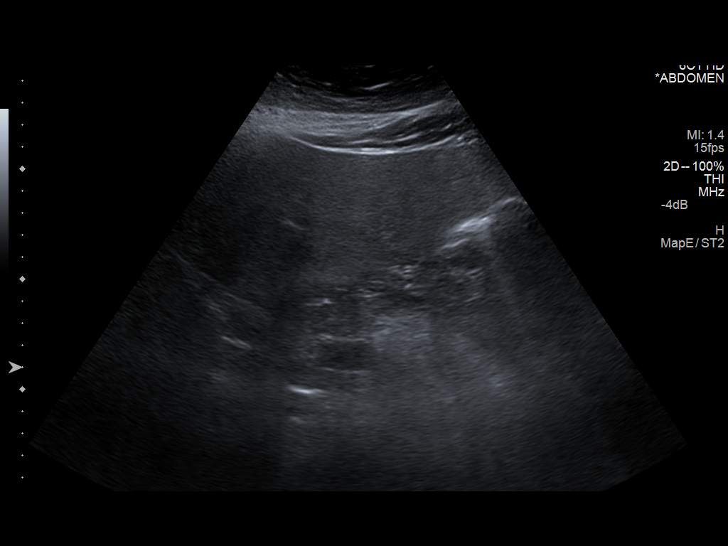
[im 7/80]
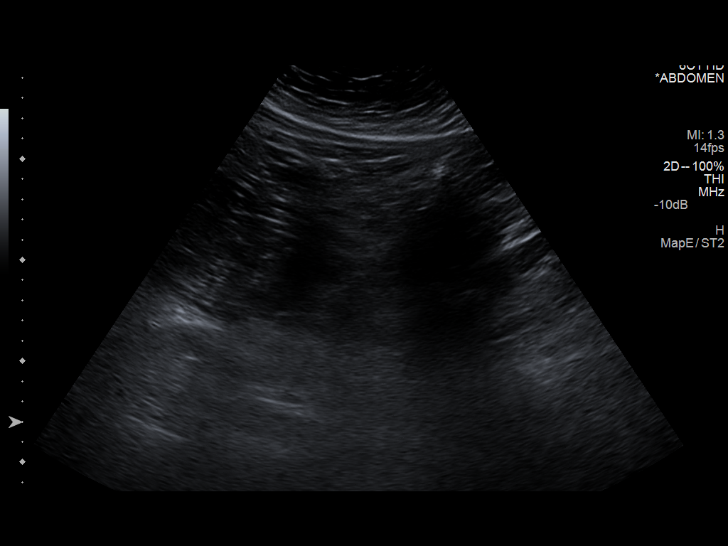
[im 14/80]
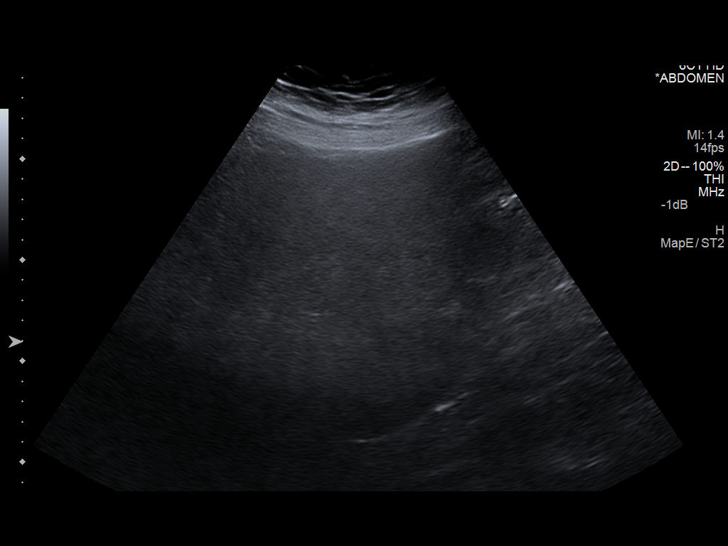
[im 20/80]
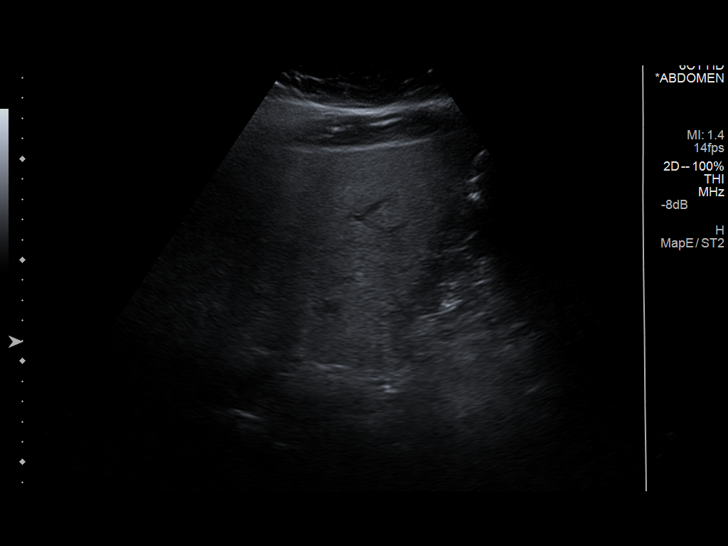
[im 27/80]
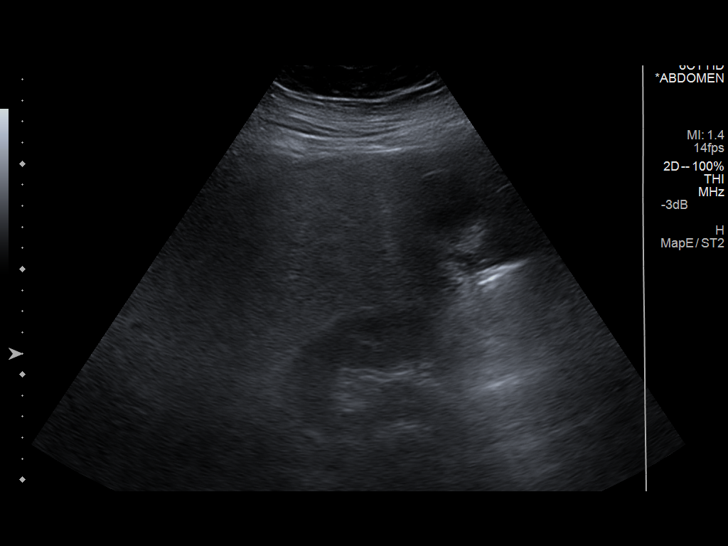
[im 30/80]
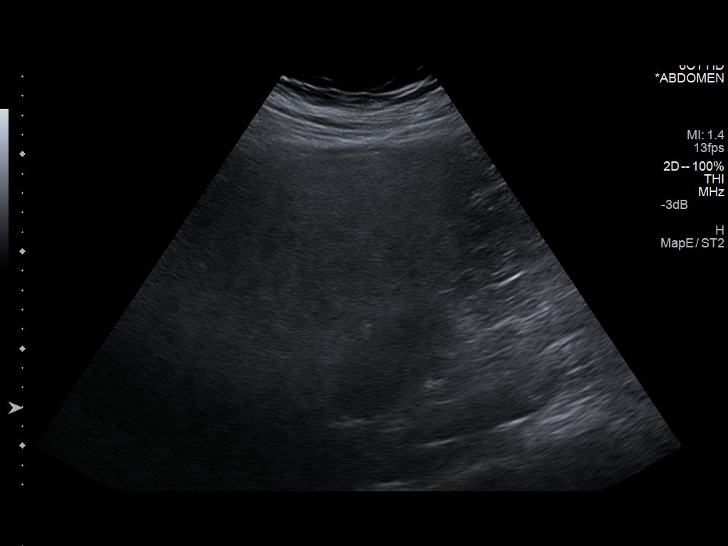
[im 37/80]
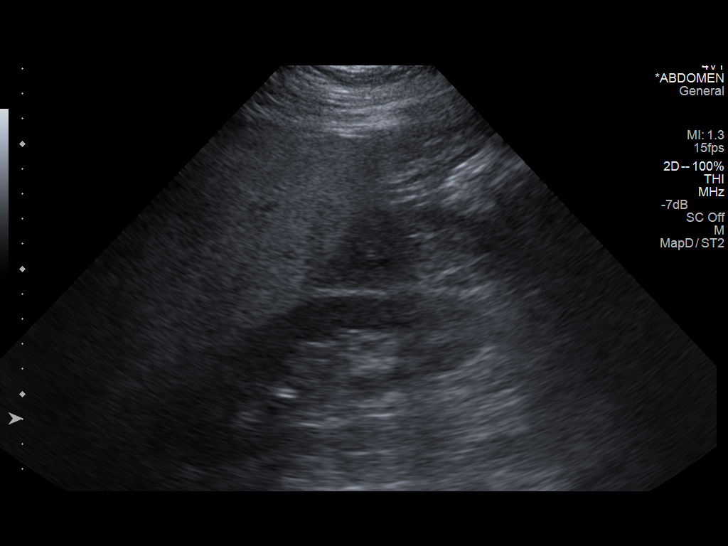
[im 43/80]
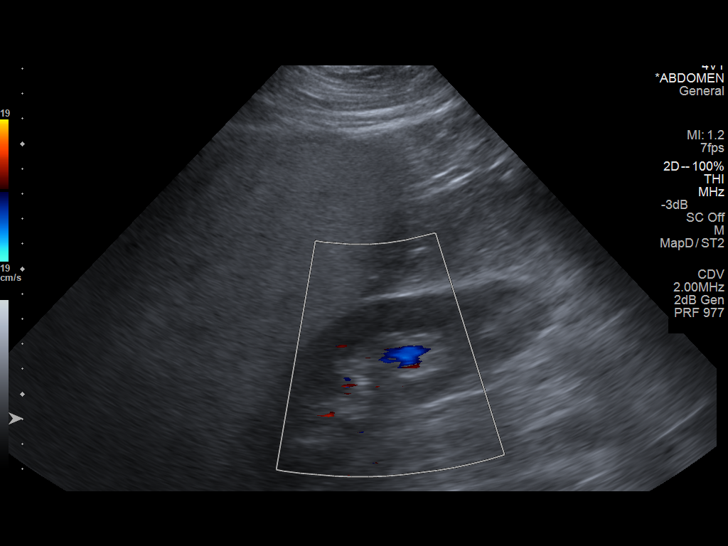
[im 50/80]
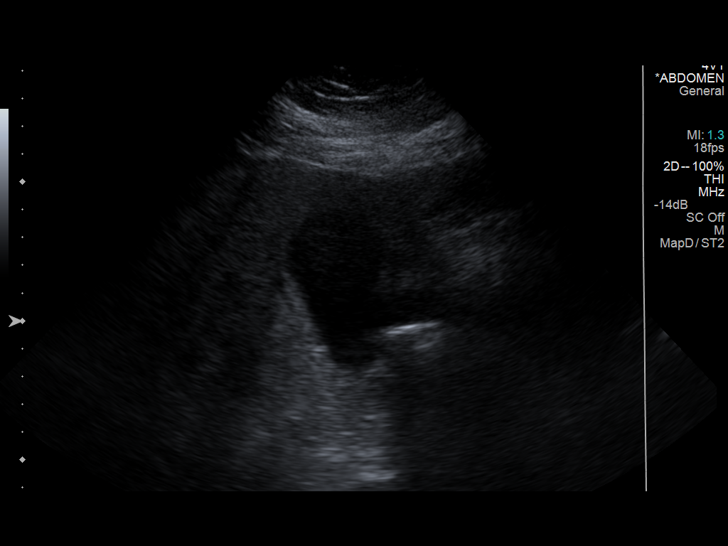
[im 53/80]
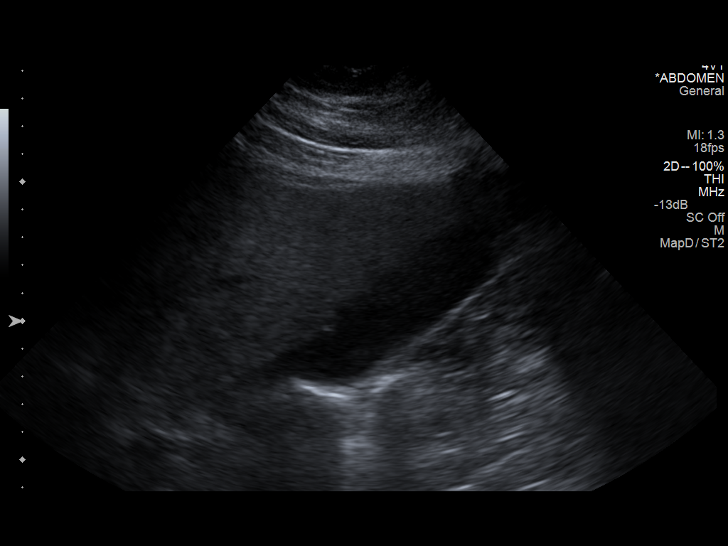
[im 60/80]
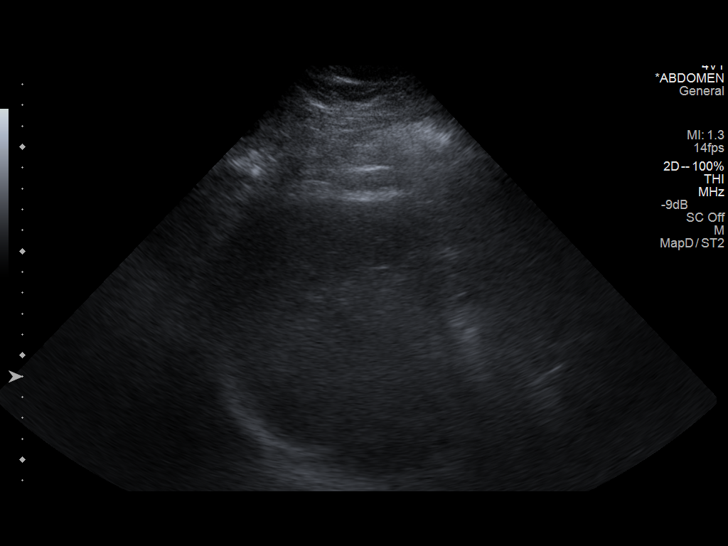
[im 66/80]
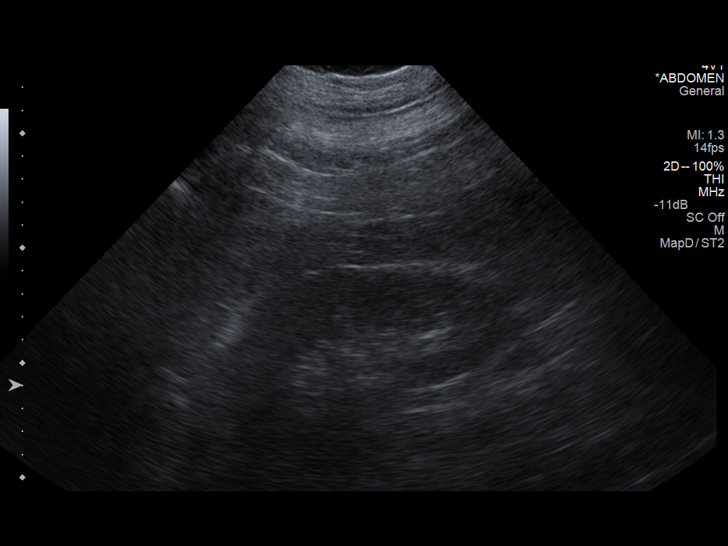
[im 73/80]
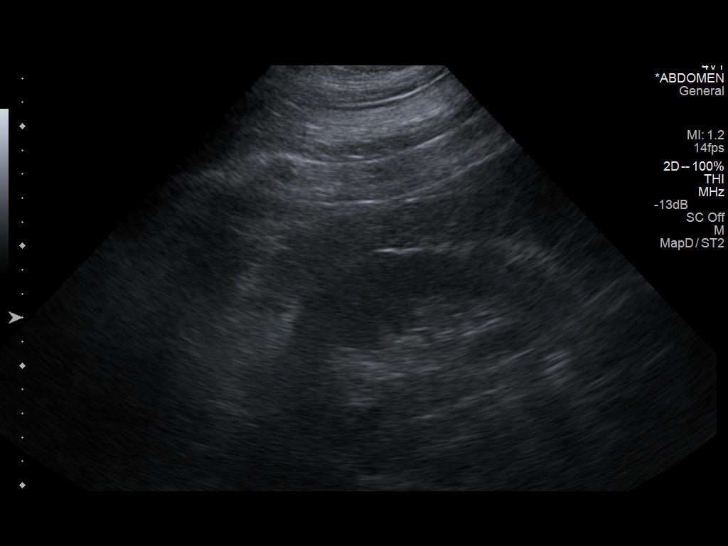
[im 80/80]
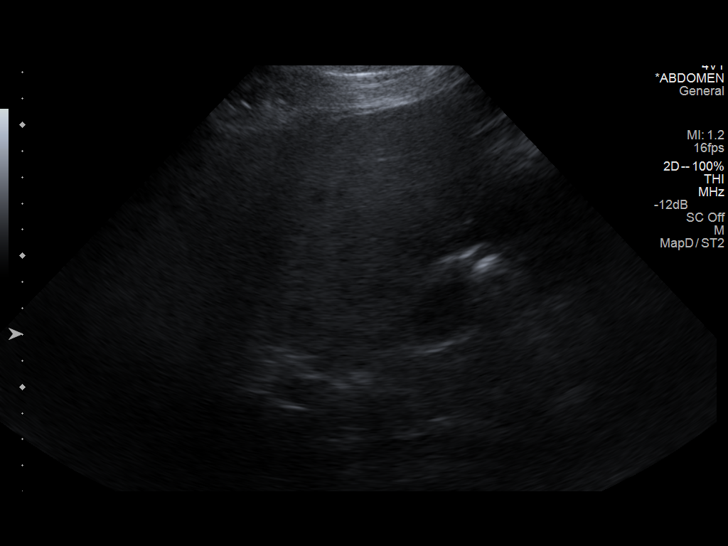

[14 of 25 positions shown; findings below may reference images not displayed]

FINDINGS: Gallbladder

No gallstones or wall thickening. Negative sonographic Murphy's
sign.

Common bile duct

Diameter: 5 mm

Liver

Diffusely increased echogenicity, with poor acoustic transmission.
No focal abnormality.

IVC

Limited visualization of by grayscale.

Pancreas

Limited visualization.

Spleen

Borderline enlarged at 11 cm. No focal abnormality.

Right Kidney

Length: 13 cm. Echogenicity within normal limits. No mass or
hydronephrosis visualized.

Left Kidney

Length: 13 cm. Echogenicity within normal limits. No mass or
hydronephrosis visualized.

Abdominal aorta

No aneurysm visualized.
IMPRESSION: 1. Hepatic steatosis.
2. No cholelithiasis or acute cholecystitis.

## 2014-08-18 ENCOUNTER — Emergency Department (HOSPITAL_COMMUNITY)
Admission: EM | Admit: 2014-08-18 | Discharge: 2014-08-18 | Disposition: A | Discharge status: Home / Self Care | Payer: Medicare Other | Attending: Emergency Medicine | Admitting: Emergency Medicine

## 2014-08-18 ENCOUNTER — Emergency Department (HOSPITAL_COMMUNITY): Payer: Medicare Other

## 2014-08-18 ENCOUNTER — Encounter (HOSPITAL_COMMUNITY): Payer: Self-pay

## 2014-08-18 DIAGNOSIS — F319 Bipolar disorder, unspecified: Secondary | ICD-10-CM | POA: Insufficient documentation

## 2014-08-18 DIAGNOSIS — E785 Hyperlipidemia, unspecified: Secondary | ICD-10-CM | POA: Diagnosis not present

## 2014-08-18 DIAGNOSIS — Z8673 Personal history of transient ischemic attack (TIA), and cerebral infarction without residual deficits: Secondary | ICD-10-CM | POA: Insufficient documentation

## 2014-08-18 DIAGNOSIS — J45909 Unspecified asthma, uncomplicated: Secondary | ICD-10-CM | POA: Diagnosis not present

## 2014-08-18 DIAGNOSIS — R51 Headache: Secondary | ICD-10-CM | POA: Insufficient documentation

## 2014-08-18 DIAGNOSIS — F909 Attention-deficit hyperactivity disorder, unspecified type: Secondary | ICD-10-CM | POA: Insufficient documentation

## 2014-08-18 DIAGNOSIS — I10 Essential (primary) hypertension: Secondary | ICD-10-CM | POA: Insufficient documentation

## 2014-08-18 DIAGNOSIS — Z7982 Long term (current) use of aspirin: Secondary | ICD-10-CM | POA: Diagnosis not present

## 2014-08-18 DIAGNOSIS — Z720 Tobacco use: Secondary | ICD-10-CM | POA: Insufficient documentation

## 2014-08-18 DIAGNOSIS — R079 Chest pain, unspecified: Secondary | ICD-10-CM | POA: Insufficient documentation

## 2014-08-18 DIAGNOSIS — Z7951 Long term (current) use of inhaled steroids: Secondary | ICD-10-CM | POA: Insufficient documentation

## 2014-08-18 DIAGNOSIS — Z79899 Other long term (current) drug therapy: Secondary | ICD-10-CM | POA: Diagnosis not present

## 2014-08-18 DIAGNOSIS — E119 Type 2 diabetes mellitus without complications: Secondary | ICD-10-CM | POA: Diagnosis not present

## 2014-08-18 DIAGNOSIS — R1031 Right lower quadrant pain: Secondary | ICD-10-CM | POA: Diagnosis not present

## 2014-08-18 DIAGNOSIS — F419 Anxiety disorder, unspecified: Secondary | ICD-10-CM | POA: Insufficient documentation

## 2014-08-18 DIAGNOSIS — R519 Headache, unspecified: Secondary | ICD-10-CM

## 2014-08-18 LAB — PROTIME-INR
INR: 1.08 (ref 0.00–1.49)
PROTHROMBIN TIME: 14.2 s (ref 11.6–15.2)

## 2014-08-18 LAB — DIFFERENTIAL
BASOS ABS: 0 10*3/uL (ref 0.0–0.1)
Basophils Relative: 0 % (ref 0–1)
EOS ABS: 0.2 10*3/uL (ref 0.0–0.7)
Eosinophils Relative: 2 % (ref 0–5)
LYMPHS PCT: 28 % (ref 12–46)
Lymphs Abs: 2.9 10*3/uL (ref 0.7–4.0)
Monocytes Absolute: 0.8 10*3/uL (ref 0.1–1.0)
Monocytes Relative: 8 % (ref 3–12)
NEUTROS ABS: 6.5 10*3/uL (ref 1.7–7.7)
Neutrophils Relative %: 62 % (ref 43–77)

## 2014-08-18 LAB — URINE MICROSCOPIC-ADD ON

## 2014-08-18 LAB — URINALYSIS, ROUTINE W REFLEX MICROSCOPIC
Glucose, UA: NEGATIVE mg/dL
Hgb urine dipstick: NEGATIVE
Ketones, ur: 15 mg/dL — AB
Leukocytes, UA: NEGATIVE
Nitrite: NEGATIVE
PH: 5.5 (ref 5.0–8.0)
Protein, ur: 30 mg/dL — AB
Specific Gravity, Urine: 1.034 — ABNORMAL HIGH (ref 1.005–1.030)
Urobilinogen, UA: 1 mg/dL (ref 0.0–1.0)

## 2014-08-18 LAB — I-STAT TROPONIN, ED: TROPONIN I, POC: 0 ng/mL (ref 0.00–0.08)

## 2014-08-18 LAB — CBC
HEMATOCRIT: 46.2 % (ref 39.0–52.0)
Hemoglobin: 15.9 g/dL (ref 13.0–17.0)
MCH: 31.5 pg (ref 26.0–34.0)
MCHC: 34.4 g/dL (ref 30.0–36.0)
MCV: 91.7 fL (ref 78.0–100.0)
Platelets: 302 10*3/uL (ref 150–400)
RBC: 5.04 MIL/uL (ref 4.22–5.81)
RDW: 12.9 % (ref 11.5–15.5)
WBC: 10.4 10*3/uL (ref 4.0–10.5)

## 2014-08-18 LAB — COMPREHENSIVE METABOLIC PANEL
ALK PHOS: 64 U/L (ref 38–126)
ALT: 46 U/L (ref 17–63)
AST: 46 U/L — ABNORMAL HIGH (ref 15–41)
Albumin: 3.1 g/dL — ABNORMAL LOW (ref 3.5–5.0)
Anion gap: 8 (ref 5–15)
BILIRUBIN TOTAL: 0.9 mg/dL (ref 0.3–1.2)
BUN: 9 mg/dL (ref 6–20)
CALCIUM: 9 mg/dL (ref 8.9–10.3)
CHLORIDE: 101 mmol/L (ref 101–111)
CO2: 27 mmol/L (ref 22–32)
Creatinine, Ser: 0.88 mg/dL (ref 0.61–1.24)
GFR calc Af Amer: >60 mL/min (ref >60)
Glucose, Bld: 156 mg/dL — ABNORMAL HIGH (ref 65–99)
Potassium: 4.2 mmol/L (ref 3.5–5.1)
Sodium: 136 mmol/L (ref 135–145)
Total Protein: 7.8 g/dL (ref 6.5–8.1)

## 2014-08-18 LAB — RAPID URINE DRUG SCREEN, HOSP PERFORMED
AMPHETAMINES: NOT DETECTED
Barbiturates: NOT DETECTED
Benzodiazepines: POSITIVE — AB
Cocaine: NOT DETECTED
OPIATES: POSITIVE — AB
Tetrahydrocannabinol: NOT DETECTED

## 2014-08-18 LAB — ETHANOL: Alcohol, Ethyl (B): <5 mg/dL (ref <5)

## 2014-08-18 LAB — APTT: APTT: 28 s (ref 24–37)

## 2014-08-18 MED ORDER — HYDROMORPHONE HCL 1 MG/ML IJ SOLN
1.0000 mg | Freq: Once | INTRAMUSCULAR | Status: AC
  Administered 2014-08-18: 1 mg via INTRAVENOUS
  Filled 2014-08-18: qty 1

## 2014-08-18 MED ORDER — MORPHINE SULFATE 4 MG/ML IJ SOLN
4.0000 mg | Freq: Once | INTRAMUSCULAR | Status: AC
  Administered 2014-08-18: 4 mg via INTRAVENOUS
  Filled 2014-08-18: qty 1

## 2014-08-18 MED ORDER — ONDANSETRON HCL 4 MG/2ML IJ SOLN
4.0000 mg | Freq: Once | INTRAMUSCULAR | Status: AC
  Administered 2014-08-18: 4 mg via INTRAVENOUS
  Filled 2014-08-18: qty 2

## 2014-08-18 MED ORDER — TRAMADOL HCL 50 MG PO TABS: 50.0000 mg | ORAL_TABLET | Freq: Four times a day (QID) | ORAL | Status: DC | PRN

## 2014-08-18 NOTE — ED Notes (Signed)
[facility]- pt coming from home after he became diaphoretic and weak. Pt became very pale. Pt initially 90% O2 on room air. Pt asked medic on scene to check BP and it was 200 palpated. Pt began having mild chest pressure en route. 324 of ASA given. Pt also had one nitro tablet en route and now has a severe headache. Pt speech is hard to understand. Pt began acting strange per EMS with some confusion. Pt alert on arrival.

## 2014-08-18 NOTE — ED Notes (Signed)
MD aware of pt status and will see pt.

## 2014-08-18 NOTE — ED Notes (Signed)
Pt left with discharge paper work, or being discharged, pt did not have IV removed by staff, IV not found in room by staff, charge nurse notified, attempted contacting pt via phone, no answer, contacted communications to send sheriff and EMS to pt's home to remove IV.

## 2014-08-18 NOTE — ED Notes (Signed)
Patient asked for and received a Ginger Ale. 

## 2014-08-18 NOTE — ED Notes (Signed)
Spoke with [facility] and requested for [facility] and [facility] to go to pt's residence to see if he still has IV in place.  Per Primary RN, pt left without speaking to RN and still had IV in place.  Staff was unable to locate saline lock in room.

## 2014-08-18 NOTE — Discharge Instructions (Signed)
Chest Pain (Nonspecific) °It is often hard to give a specific diagnosis for the cause of chest pain. There is always a chance that your pain could be related to something serious, such as a heart attack or a blood clot in the lungs. You need to follow up with your health care provider for further evaluation. °CAUSES  °· Heartburn. °· Pneumonia or bronchitis. °· Anxiety or stress. °· Inflammation around your heart (pericarditis) or lung (pleuritis or pleurisy). °· A blood clot in the lung. °· A collapsed lung (pneumothorax). It can develop suddenly on its own (spontaneous pneumothorax) or from trauma to the chest. °· Shingles infection (herpes zoster virus). °The chest wall is composed of bones, muscles, and cartilage. Any of these can be the source of the pain. °· The bones can be bruised by injury. °· The muscles or cartilage can be strained by coughing or overwork. °· The cartilage can be affected by inflammation and become sore (costochondritis). °DIAGNOSIS  °Lab tests or other studies may be needed to find the cause of your pain. Your health care provider may have you take a test called an ambulatory electrocardiogram (ECG). An ECG records your heartbeat patterns over a 24-hour period. You may also have other tests, such as: °· Transthoracic echocardiogram (TTE). During echocardiography, sound waves are used to evaluate how blood flows through your heart. °· Transesophageal echocardiogram (TEE). °· Cardiac monitoring. This allows your health care provider to monitor your heart rate and rhythm in real time. °· Holter monitor. This is a portable device that records your heartbeat and can help diagnose heart arrhythmias. It allows your health care provider to track your heart activity for several days, if needed. °· Stress tests by exercise or by giving medicine that makes the heart beat faster. °TREATMENT  °· Treatment depends on what may be causing your chest pain. Treatment may include: °¨ Acid blockers for  heartburn. °¨ Anti-inflammatory medicine. °¨ Pain medicine for inflammatory conditions. °¨ Antibiotics if an infection is present. °· You may be advised to change lifestyle habits. This includes stopping smoking and avoiding alcohol, caffeine, and chocolate. °· You may be advised to keep your head raised (elevated) when sleeping. This reduces the chance of acid going backward from your stomach into your esophagus. °Most of the time, nonspecific chest pain will improve within 2-3 days with rest and mild pain medicine.  °HOME CARE INSTRUCTIONS  °· If antibiotics were prescribed, take them as directed. Finish them even if you start to feel better. °· For the next few days, avoid physical activities that bring on chest pain. Continue physical activities as directed. °· Do not use any tobacco products, including cigarettes, chewing tobacco, or electronic cigarettes. °· Avoid drinking alcohol. °· Only take medicine as directed by your health care provider. °· Follow your health care provider's suggestions for further testing if your chest pain does not go away. °· Keep any follow-up appointments you made. If you do not go to an appointment, you could develop lasting (chronic) problems with pain. If there is any problem keeping an appointment, call to reschedule. °SEEK MEDICAL CARE IF:  °· Your chest pain does not go away, even after treatment. °· You have a rash with blisters on your chest. °· You have a fever. °SEEK IMMEDIATE MEDICAL CARE IF:  °· You have increased chest pain or pain that spreads to your arm, neck, jaw, back, or abdomen. °· You have shortness of breath. °· You have an increasing cough, or you cough   up blood. °· You have severe back or abdominal pain. °· You feel nauseous or vomit. °· You have severe weakness. °· You faint. °· You have chills. °This is an emergency. Do not wait to see if the pain will go away. Get medical help at once. Call your local emergency services (911 in [place] Do not drive  yourself to the hospital. °MAKE SURE YOU:  °· Understand these instructions. °· Will watch your condition. °· Will get help right away if you are not doing well or get worse. °Document Released: 11/25/2004 Document Revised: 02/20/2013 Document Reviewed: 09/21/2007 °[employer]® Patient Information ©2015 [employer]. This information is not intended to replace advice given to you by your health care provider. Make sure you discuss any questions you have with your health care provider. °General Headache Without Cause °A headache is pain or discomfort felt around the head or neck area. The specific cause of a headache may not be found. There are many causes and types of headaches. A few common ones are: °· Tension headaches. °· Migraine headaches. °· Cluster headaches. °· Chronic daily headaches. °HOME CARE INSTRUCTIONS  °· Keep all follow-up appointments with your caregiver or any specialist referral. °· Only take over-the-counter or prescription medicines for pain or discomfort as directed by your caregiver. °· Lie down in a dark, quiet room when you have a headache. °· Keep a headache journal to find out what may trigger your migraine headaches. For example, write down: °¨ What you eat and drink. °¨ How much sleep you get. °¨ Any change to your diet or medicines. °· Try massage or other relaxation techniques. °· Put ice packs or heat on the head and neck. Use these 3 to 4 times per day for 15 to 20 minutes each time, or as needed. °· Limit stress. °· Sit up straight, and do not tense your muscles. °· Quit smoking if you smoke. °· Limit alcohol use. °· Decrease the amount of caffeine you drink, or stop drinking caffeine. °· Eat and sleep on a regular schedule. °· Get 7 to 9 hours of sleep, or as recommended by your caregiver. °· Keep lights dim if bright lights bother you and make your headaches worse. °SEEK MEDICAL CARE IF:  °· You have problems with the medicines you were prescribed. °· Your medicines are not  working. °· You have a change from the usual headache. °· You have nausea or vomiting. °SEEK IMMEDIATE MEDICAL CARE IF:  °· Your headache becomes severe. °· You have a fever. °· You have a stiff neck. °· You have loss of vision. °· You have muscular weakness or loss of muscle control. °· You start losing your balance or have trouble walking. °· You feel faint or pass out. °· You have severe symptoms that are different from your first symptoms. °MAKE SURE YOU:  °· Understand these instructions. °· Will watch your condition. °· Will get help right away if you are not doing well or get worse. °Document Released: 02/15/2005 Document Revised: 05/10/2011 Document Reviewed: 03/03/2011 °[employer]® Patient Information ©2015 [employer]. This information is not intended to replace advice given to you by your health care provider. Make sure you discuss any questions you have with your health care provider. ° °

## 2014-08-18 NOTE — ED Provider Notes (Signed)
CSN: [id number]     Arrival date & time 08/18/14  1658 History   First MD Initiated Contact with Patient 08/18/14 1700     Chief Complaint  Patient presents with  . Headache  . Chest Pain   HPI Patient presents to the emergency room with an acute complaint of near syncope. Patient states EMS was called to his house because of medical problems that his son was having.  While EMS was there for his son the patient began feeling very lightheaded and weak. He broke out into a sweat and appeared pale. EMS checked his blood pressure was elevated at 200 systolic. Patient began having trouble with chest pressure. He was then given an aspirin and nitroglycerin. She is now complaining of a severe headache. He denies any focal numbness but feels weak all over. The headache is all over his head. He denies any fevers chills any neck pain or stiffness. Also complains of abdominal pain. This has been a recurrent issue for him. Patient states he has been evaluated for abdominal pain including ultrasounds and CAT scans. The can't quite determine the source. Pain is sharp and severe in his lower abdomen on the right side. Past Medical History  Diagnosis Date  . Heart valve disorder     s/p echocardiogram  . Hypertension   . Asthma   . Diabetes mellitus   . Hyperlipidemia   . Anaphylactic reaction   . Bipolar disorder   . Adult ADHD   . Morbidly obese   . Transient cerebral ischemia     Unknown  . OSA (obstructive sleep apnea)   . Anxiety    Past Surgical History  Procedure Laterality Date  . Carpal tunnel release    . Nose surgery    . Wisdom tooth extraction    . Tooth extraction     Family History  Problem Relation Age of Onset  . CAD Mother     Living  . Diabetes Mellitus II Mother   . Stroke Mother   . Hypertension Mother   . Congestive Heart Failure Mother   . Kidney disease Mother   . Fibromyalgia Mother   . Thyroid disease Mother   . Hyperlipidemia Mother   . Liver disease Mother    . Alcoholism Father 73    Deceased  . Arthritis Maternal Grandmother   . Congestive Heart Failure Maternal Grandmother   . Hypertension Maternal Grandmother   . Lung cancer Maternal Grandfather   . Colon cancer Maternal Aunt   . Stomach cancer Maternal Aunt   . Heart disease Other     Paternal & Maternal  . Hypertension Other     Paternal & Maternal  . Hypertension Brother     x3  . Hypertension Sister     #1  . Bipolar disorder Sister     #1  . ADD / ADHD Son     x3  . Bipolar disorder Son     x3  . Asperger's syndrome Son    History  Substance Use Topics  . Smoking status: Current Every Day Smoker -- 1.00 packs/day for 23 years    Types: Cigarettes  . Smokeless tobacco: Never Used  . Alcohol Use: 0.0 oz/week    0 Standard drinks or equivalent per week     Comment: rare    Review of Systems  All other systems reviewed and are negative.     Allergies  Atenolol; Other; and Tylenol  Home Medications   Prior to  Admission medications   Medication Sig Start Date End Date Taking? Authorizing Provider  ALPRAZolam Prudy Feeler) 1 MG tablet Take 1 mg by mouth 4 (four) times daily.    Yes Historical Provider, MD  aspirin 325 MG tablet Take 1 tablet (325 mg total) by mouth daily. 09/04/13  Yes [name], MD  citalopram (CELEXA) 10 MG tablet Take 1 tablet (10 mg total) by mouth daily. 03/06/14  Yes Waldon Merl, PA-C  oxycodone (ROXICODONE) 30 MG immediate release tablet Take 30 mg by mouth every 4 (four) hours as needed. For pain 08/12/14  Yes Historical Provider, MD  albuterol (PROVENTIL HFA;VENTOLIN HFA) 108 (90 BASE) MCG/ACT inhaler Inhale 2 puffs into the lungs every 6 (six) hours as needed for wheezing or shortness of breath. 03/06/14   Waldon Merl, PA-C  cholecalciferol (VITAMIN D) 1000 UNITS tablet Take 1,000 Units by mouth daily.    Historical Provider, MD  ciprofloxacin (CIPRO) 250 MG tablet Take 1 tablet (250 mg total) by mouth every 12 (twelve) hours. Patient  not taking: Reported on 08/18/2014 05/26/14   Charlestine Night, PA-C  cloNIDine (CATAPRES) 0.2 MG tablet Take 1 tablet (0.2 mg total) by mouth at bedtime. 03/06/14   Waldon Merl, PA-C  Fluticasone-Salmeterol (ADVAIR) 250-50 MCG/DOSE AEPB Inhale 1 puff into the lungs every 12 (twelve) hours. 03/06/14   Waldon Merl, PA-C  furosemide (LASIX) 20 MG tablet Take 1 tablet (20 mg total) by mouth daily. Patient not taking: Reported on 05/26/2014 03/06/14   Waldon Merl, PA-C  lisinopril (PRINIVIL,ZESTRIL) 40 MG tablet Take 40 mg by mouth 2 (two) times daily. 05/17/14   Historical Provider, MD  metFORMIN (GLUCOPHAGE) 500 MG tablet Take 1 tablet (500 mg total) by mouth 2 (two) times daily with a meal. Patient not taking: Reported on 05/26/2014 03/06/14   Waldon Merl, PA-C  metoCLOPramide (REGLAN) 10 MG tablet Take 1 tablet (10 mg total) by mouth every 6 (six) hours. 05/26/14   Charlestine Night, PA-C  omeprazole (PRILOSEC) 20 MG capsule Take 1 capsule (20 mg total) by mouth daily. 03/06/14   Waldon Merl, PA-C  oxyCODONE (ROXICODONE) 5 MG immediate release tablet Take 1 tablet (5 mg total) by mouth every 4 (four) hours as needed for severe pain. Patient not taking: Reported on 08/18/2014 06/07/14   Lenell Antu, MD  pravastatin (PRAVACHOL) 40 MG tablet Take 1 tablet (40 mg total) by mouth at bedtime. Patient not taking: Reported on 05/26/2014 03/06/14   Waldon Merl, PA-C  sitaGLIPtin-metformin (JANUMET) 50-500 MG per tablet Take 1 tablet by mouth 2 (two) times daily with a meal.    Historical Provider, MD  traMADol (ULTRAM) 50 MG tablet Take 1 tablet (50 mg total) by mouth every 6 (six) hours as needed. 08/18/14   Linwood Dibbles, MD  zolpidem (AMBIEN) 10 MG tablet Take 10 mg by mouth at bedtime as needed for sleep.    Historical Provider, MD   BP 139/72 mmHg  Pulse 103  Temp(Src) 98.2 F (36.8 C) (Oral)  Resp 26  SpO2 93% Physical Exam  Constitutional: He appears distressed.  Uncomfortable  appearing, morbidly obese  HENT:  Head: Normocephalic and atraumatic.  Right Ear: External ear normal.  Left Ear: External ear normal.  Eyes: Conjunctivae are normal. Right eye exhibits no discharge. Left eye exhibits no discharge. No scleral icterus.  Neck: Full passive range of motion without pain. Neck supple. No rigidity. No tracheal deviation and normal range of motion present.  Cardiovascular: Normal  rate, regular rhythm and intact distal pulses.   Pulmonary/Chest: Effort normal and breath sounds normal. No stridor. No respiratory distress. He has no wheezes. He has no rales.  Abdominal: Soft. Bowel sounds are normal. He exhibits no distension. There is tenderness in the right lower quadrant. There is no rebound and no guarding. No hernia.  Musculoskeletal: He exhibits no edema or tenderness.  Neurological: He is alert. He has normal strength. No cranial nerve deficit (no facial droop, extraocular movements intact, no slurred speech) or sensory deficit. He exhibits normal muscle tone. He displays no seizure activity. Coordination normal.  Skin: Skin is warm and dry. No rash noted.  Psychiatric: He has a normal mood and affect.  Nursing note and vitals reviewed.   ED Course  Procedures (including critical care time) Labs Review Labs Reviewed  COMPREHENSIVE METABOLIC PANEL - Abnormal; Notable for the following:    Glucose, Bld 156 (*)    Albumin 3.1 (*)    AST 46 (*)    All other components within normal limits  URINE RAPID DRUG SCREEN, HOSP PERFORMED - Abnormal; Notable for the following:    Opiates POSITIVE (*)    Benzodiazepines POSITIVE (*)    All other components within normal limits  ETHANOL  PROTIME-INR  APTT  CBC  DIFFERENTIAL  URINALYSIS, ROUTINE W REFLEX MICROSCOPIC (NOT AT [facility])  Rosezena Sensor, ED    Imaging Review    EKG Interpretation   Date/Time:  Sunday August 18 2014 17:11:57 EDT Ventricular Rate:  99 PR Interval:  165 QRS Duration: 82 QT  Interval:  339 QTC Calculation: 435 R Axis:   90 Text Interpretation:  Sinus rhythm Borderline right axis deviation No  significant change since last tracing Confirmed by Humna Moorehouse  MD-J, Forever Arechiga  ([id number]) on 08/18/2014 5:16:21 PM      MDM   Final diagnoses:  Acute nonintractable headache, unspecified headache type  Chest pain, unspecified chest pain type    Pt's symptoms resolved with treatment in the ED.  His headache resolved and he is no loner having chest discomfort.  Symptoms may have been triggered by his chronic abdominal issues.  I doubt ACS, PE, PNA, PTX.  Headache may be related to the NTG.  Doubt stroke, hemorrhage, meningitis.  At this time there does not appear to be any evidence of an acute emergency medical condition and the patient appears stable for discharge with appropriate outpatient follow up.    Linwood Dibbles, MD 08/18/14 2136

## 2014-08-21 ENCOUNTER — Other Ambulatory Visit: Payer: Self-pay | Admitting: Physician Assistant

## 2014-08-21 ENCOUNTER — Other Ambulatory Visit: Payer: Self-pay | Admitting: General Practice

## 2014-08-21 DIAGNOSIS — M25511 Pain in right shoulder: Secondary | ICD-10-CM

## 2015-03-30 ENCOUNTER — Emergency Department (HOSPITAL_COMMUNITY)

## 2015-03-30 ENCOUNTER — Emergency Department (HOSPITAL_COMMUNITY)
Admission: EM | Admit: 2015-03-30 | Discharge: 2015-03-30 | Disposition: A | Discharge status: Home / Self Care | Attending: Emergency Medicine | Admitting: Emergency Medicine

## 2015-03-30 ENCOUNTER — Encounter (HOSPITAL_COMMUNITY): Payer: Self-pay | Admitting: *Deleted

## 2015-03-30 DIAGNOSIS — R109 Unspecified abdominal pain: Secondary | ICD-10-CM

## 2015-03-30 DIAGNOSIS — E119 Type 2 diabetes mellitus without complications: Secondary | ICD-10-CM | POA: Insufficient documentation

## 2015-03-30 DIAGNOSIS — Z8673 Personal history of transient ischemic attack (TIA), and cerebral infarction without residual deficits: Secondary | ICD-10-CM | POA: Diagnosis not present

## 2015-03-30 DIAGNOSIS — R1084 Generalized abdominal pain: Secondary | ICD-10-CM | POA: Diagnosis not present

## 2015-03-30 DIAGNOSIS — R200 Anesthesia of skin: Secondary | ICD-10-CM | POA: Diagnosis not present

## 2015-03-30 DIAGNOSIS — F1110 Opioid abuse, uncomplicated: Secondary | ICD-10-CM | POA: Diagnosis not present

## 2015-03-30 DIAGNOSIS — F459 Somatoform disorder, unspecified: Secondary | ICD-10-CM

## 2015-03-30 DIAGNOSIS — Z7289 Other problems related to lifestyle: Secondary | ICD-10-CM | POA: Diagnosis not present

## 2015-03-30 DIAGNOSIS — R531 Weakness: Secondary | ICD-10-CM | POA: Diagnosis present

## 2015-03-30 DIAGNOSIS — J45909 Unspecified asthma, uncomplicated: Secondary | ICD-10-CM | POA: Insufficient documentation

## 2015-03-30 DIAGNOSIS — I10 Essential (primary) hypertension: Secondary | ICD-10-CM | POA: Diagnosis not present

## 2015-03-30 DIAGNOSIS — Z8669 Personal history of other diseases of the nervous system and sense organs: Secondary | ICD-10-CM | POA: Diagnosis not present

## 2015-03-30 DIAGNOSIS — F17210 Nicotine dependence, cigarettes, uncomplicated: Secondary | ICD-10-CM | POA: Insufficient documentation

## 2015-03-30 DIAGNOSIS — F1310 Sedative, hypnotic or anxiolytic abuse, uncomplicated: Secondary | ICD-10-CM | POA: Insufficient documentation

## 2015-03-30 DIAGNOSIS — Z765 Malingerer [conscious simulation]: Secondary | ICD-10-CM

## 2015-03-30 DIAGNOSIS — F419 Anxiety disorder, unspecified: Secondary | ICD-10-CM | POA: Diagnosis not present

## 2015-03-30 DIAGNOSIS — F909 Attention-deficit hyperactivity disorder, unspecified type: Secondary | ICD-10-CM | POA: Insufficient documentation

## 2015-03-30 DIAGNOSIS — Z7982 Long term (current) use of aspirin: Secondary | ICD-10-CM | POA: Insufficient documentation

## 2015-03-30 DIAGNOSIS — Z79899 Other long term (current) drug therapy: Secondary | ICD-10-CM | POA: Insufficient documentation

## 2015-03-30 DIAGNOSIS — F1910 Other psychoactive substance abuse, uncomplicated: Secondary | ICD-10-CM

## 2015-03-30 DIAGNOSIS — Z7984 Long term (current) use of oral hypoglycemic drugs: Secondary | ICD-10-CM | POA: Diagnosis not present

## 2015-03-30 DIAGNOSIS — F319 Bipolar disorder, unspecified: Secondary | ICD-10-CM | POA: Diagnosis not present

## 2015-03-30 DIAGNOSIS — R6883 Chills (without fever): Secondary | ICD-10-CM | POA: Insufficient documentation

## 2015-03-30 LAB — CBC
HEMATOCRIT: 46.1 % (ref 39.0–52.0)
HEMOGLOBIN: 16.1 g/dL (ref 13.0–17.0)
MCH: 32 pg (ref 26.0–34.0)
MCHC: 34.9 g/dL (ref 30.0–36.0)
MCV: 91.7 fL (ref 78.0–100.0)
Platelets: 230 10*3/uL (ref 150–400)
RBC: 5.03 MIL/uL (ref 4.22–5.81)
RDW: 12.6 % (ref 11.5–15.5)
WBC: 10.1 10*3/uL (ref 4.0–10.5)

## 2015-03-30 LAB — ETHANOL: Alcohol, Ethyl (B): <5 mg/dL (ref <5)

## 2015-03-30 LAB — URINALYSIS, ROUTINE W REFLEX MICROSCOPIC
Bilirubin Urine: NEGATIVE
Glucose, UA: NEGATIVE mg/dL
Hgb urine dipstick: NEGATIVE
Ketones, ur: NEGATIVE mg/dL
LEUKOCYTES UA: NEGATIVE
NITRITE: NEGATIVE
PH: 8.5 — AB (ref 5.0–8.0)
Protein, ur: NEGATIVE mg/dL
SPECIFIC GRAVITY, URINE: 1.01 (ref 1.005–1.030)

## 2015-03-30 LAB — COMPREHENSIVE METABOLIC PANEL
ALBUMIN: 3.7 g/dL (ref 3.5–5.0)
ALK PHOS: 71 U/L (ref 38–126)
ALT: 80 U/L — ABNORMAL HIGH (ref 17–63)
AST: 88 U/L — AB (ref 15–41)
Anion gap: 9 (ref 5–15)
BILIRUBIN TOTAL: 1.6 mg/dL — AB (ref 0.3–1.2)
BUN: 6 mg/dL (ref 6–20)
CALCIUM: 9.2 mg/dL (ref 8.9–10.3)
CO2: 25 mmol/L (ref 22–32)
Chloride: 101 mmol/L (ref 101–111)
Creatinine, Ser: 0.71 mg/dL (ref 0.61–1.24)
GFR calc Af Amer: >60 mL/min (ref >60)
GLUCOSE: 150 mg/dL — AB (ref 65–99)
Potassium: 4.8 mmol/L (ref 3.5–5.1)
Sodium: 135 mmol/L (ref 135–145)
TOTAL PROTEIN: 8.2 g/dL — AB (ref 6.5–8.1)

## 2015-03-30 LAB — CBG MONITORING, ED: GLUCOSE-CAPILLARY: 148 mg/dL — AB (ref 65–99)

## 2015-03-30 LAB — RAPID URINE DRUG SCREEN, HOSP PERFORMED
Amphetamines: NOT DETECTED
Barbiturates: NOT DETECTED
Benzodiazepines: POSITIVE — AB
Cocaine: NOT DETECTED
OPIATES: POSITIVE — AB
Tetrahydrocannabinol: NOT DETECTED

## 2015-03-30 LAB — DIFFERENTIAL
Basophils Absolute: 0 10*3/uL (ref 0.0–0.1)
Basophils Relative: 0 %
EOS PCT: 1 %
Eosinophils Absolute: 0.1 10*3/uL (ref 0.0–0.7)
LYMPHS ABS: 2.4 10*3/uL (ref 0.7–4.0)
LYMPHS PCT: 24 %
MONOS PCT: 6 %
Monocytes Absolute: 0.6 10*3/uL (ref 0.1–1.0)
NEUTROS PCT: 69 %
Neutro Abs: 7.1 10*3/uL (ref 1.7–7.7)

## 2015-03-30 LAB — PROTIME-INR
INR: 1.11 (ref 0.00–1.49)
Prothrombin Time: 14.5 seconds (ref 11.6–15.2)

## 2015-03-30 LAB — APTT: aPTT: 24 seconds (ref 24–37)

## 2015-03-30 MED ORDER — LORAZEPAM 2 MG/ML IJ SOLN
1.0000 mg | Freq: Once | INTRAMUSCULAR | Status: AC
  Administered 2015-03-30: 1 mg via INTRAVENOUS
  Filled 2015-03-30: qty 1

## 2015-03-30 MED ORDER — FENTANYL CITRATE (PF) 100 MCG/2ML IJ SOLN
50.0000 ug | Freq: Once | INTRAMUSCULAR | Status: AC
  Administered 2015-03-30: 50 ug via INTRAVENOUS
  Filled 2015-03-30: qty 2

## 2015-03-30 MED ORDER — NAPROXEN 500 MG PO TABS: 500.0000 mg | ORAL_TABLET | Freq: Two times a day (BID) | ORAL | Status: DC

## 2015-03-30 MED ORDER — ONDANSETRON HCL 4 MG/2ML IJ SOLN
4.0000 mg | Freq: Once | INTRAMUSCULAR | Status: AC
  Administered 2015-03-30: 4 mg via INTRAVENOUS
  Filled 2015-03-30: qty 2

## 2015-03-30 MED ORDER — SODIUM CHLORIDE 0.9 % IV SOLN
INTRAVENOUS | Status: DC
  Administered 2015-03-30: 1000 mL via INTRAVENOUS

## 2015-03-30 NOTE — ED Provider Notes (Addendum)
CSN: [id number]     Arrival date & time 03/30/15  1127 History  By signing my name below, I, Gonzella Lex, attest that this documentation has been prepared under the direction and in the presence of Vanetta Mulders, MD. Electronically Signed: Gonzella Lex, Scribe. 03/30/2015. 12:00 PM.   Chief Complaint  Patient presents with  . Code Stroke   The history is provided by the patient, the EMS personnel, the police and a caregiver. No language interpreter was used.   HPI Comments: Bryan Gallegos is a 40 y.o. male with a hx of a right sided stroke approximately one year ago, brought via EMS, who presents to the Emergency Department complaining of sudden onset, left sided numbness to his upper and lower extremity which began seven hours ago after being processed into jail. Per the nurse at the jail, pt also was experiencing mild weakness on his left side which showed signs of improvement before arrival to the ED. He also reports associated periumbilical abdominal pain and chills which began last night. He denies hx of similar abdominal pain.   Past Medical History  Diagnosis Date  . Heart valve disorder     s/p echocardiogram  . Hypertension   . Asthma   . Diabetes mellitus   . Hyperlipidemia   . Anaphylactic reaction   . Bipolar disorder (HCC)   . Adult ADHD   . Morbidly obese (HCC)   . Transient cerebral ischemia     Unknown  . OSA (obstructive sleep apnea)   . Anxiety    Past Surgical History  Procedure Laterality Date  . Carpal tunnel release    . Nose surgery    . Wisdom tooth extraction    . Tooth extraction     Family History  Problem Relation Age of Onset  . CAD Mother     Living  . Diabetes Mellitus II Mother   . Stroke Mother   . Hypertension Mother   . Congestive Heart Failure Mother   . Kidney disease Mother   . Fibromyalgia Mother   . Thyroid disease Mother   . Hyperlipidemia Mother   . Liver disease Mother   . Alcoholism Father 27     Deceased  . Arthritis Maternal Grandmother   . Congestive Heart Failure Maternal Grandmother   . Hypertension Maternal Grandmother   . Lung cancer Maternal Grandfather   . Colon cancer Maternal Aunt   . Stomach cancer Maternal Aunt   . Heart disease Other     Paternal & Maternal  . Hypertension Other     Paternal & Maternal  . Hypertension Brother     x3  . Hypertension Sister     #1  . Bipolar disorder Sister     #1  . ADD / ADHD Son     x3  . Bipolar disorder Son     x3  . Asperger's syndrome Son    Social History  Substance Use Topics  . Smoking status: Current Every Day Smoker -- 1.00 packs/day for 23 years    Types: Cigarettes  . Smokeless tobacco: Never Used  . Alcohol Use: 0.0 oz/week    0 Standard drinks or equivalent per week     Comment: rare    Review of Systems  Constitutional: Positive for chills.  Gastrointestinal: Positive for abdominal pain.  Neurological: Positive for facial asymmetry, weakness and numbness.    Allergies  Atenolol; Other; and Tylenol  Home Medications   Prior to Admission medications  Medication Sig Start Date End Date Taking? Authorizing Provider  albuterol (PROVENTIL HFA;VENTOLIN HFA) 108 (90 BASE) MCG/ACT inhaler Inhale 2 puffs into the lungs every 6 (six) hours as needed for wheezing or shortness of breath. 03/06/14  Yes Waldon Merl, PA-C  ALPRAZolam Prudy Feeler) 1 MG tablet Take 1 mg by mouth 4 (four) times daily.    Yes Historical Provider, MD  amphetamine-dextroamphetamine (ADDERALL) 30 MG tablet Take 30 mg by mouth 2 (two) times daily.   Yes Historical Provider, MD  aspirin 325 MG tablet Take 1 tablet (325 mg total) by mouth daily. 09/04/13  Yes [name], MD  cholecalciferol (VITAMIN D) 1000 UNITS tablet Take 1,000 Units by mouth daily.   Yes Historical Provider, MD  cloNIDine (CATAPRES) 0.2 MG tablet Take 1 tablet (0.2 mg total) by mouth at bedtime. 03/06/14  Yes Waldon Merl, PA-C  Fluticasone-Salmeterol (ADVAIR)  250-50 MCG/DOSE AEPB Inhale 1 puff into the lungs every 12 (twelve) hours. 03/06/14  Yes Waldon Merl, PA-C  gabapentin (NEURONTIN) 800 MG tablet Take 2,400 mg by mouth 2 (two) times daily.   Yes Historical Provider, MD  lisinopril (PRINIVIL,ZESTRIL) 40 MG tablet Take 40 mg by mouth 2 (two) times daily. 05/17/14  Yes Historical Provider, MD  omeprazole (PRILOSEC) 20 MG capsule Take 1 capsule (20 mg total) by mouth daily. 03/06/14  Yes Waldon Merl, PA-C  oxycodone (ROXICODONE) 30 MG immediate release tablet Take 30 mg by mouth every 4 (four) hours as needed. For pain 08/12/14  Yes Historical Provider, MD  PRESCRIPTION MEDICATION Take 1 tablet by mouth daily. Cholesterol Medication.   Yes Historical Provider, MD  sitaGLIPtin-metformin (JANUMET) 50-500 MG per tablet Take 1 tablet by mouth 2 (two) times daily with a meal.   Yes Historical Provider, MD  zolpidem (AMBIEN) 10 MG tablet Take 10 mg by mouth at bedtime as needed for sleep.   Yes Historical Provider, MD  citalopram (CELEXA) 10 MG tablet Take 1 tablet (10 mg total) by mouth daily. Patient not taking: Reported on 03/30/2015 03/06/14   Waldon Merl, PA-C  furosemide (LASIX) 20 MG tablet Take 1 tablet (20 mg total) by mouth daily. Patient not taking: Reported on 05/26/2014 03/06/14   Waldon Merl, PA-C  metFORMIN (GLUCOPHAGE) 500 MG tablet Take 1 tablet (500 mg total) by mouth 2 (two) times daily with a meal. Patient not taking: Reported on 05/26/2014 03/06/14   Waldon Merl, PA-C  naproxen (NAPROSYN) 500 MG tablet Take 1 tablet (500 mg total) by mouth 2 (two) times daily. 03/30/15   Vanetta Mulders, MD  pravastatin (PRAVACHOL) 40 MG tablet Take 1 tablet (40 mg total) by mouth at bedtime. Patient not taking: Reported on 05/26/2014 03/06/14   Waldon Merl, PA-C   BP 150/90 mmHg  Pulse 92  Temp(Src) 97.7 F (36.5 C)  Resp 21  Wt 169.01 kg  SpO2 97% Physical Exam  Constitutional: He is oriented to person, place, and time. He appears  well-developed and well-nourished. No distress.  HENT:  Head: Normocephalic and atraumatic.  Both eyebrows are moving  Tongue movement good Facial movement seems pretty good   Eyes: Pupils are equal, round, and reactive to light.  Eyes do not track very well  Cardiovascular: Normal rate, regular rhythm and normal heart sounds.   No murmur heard. Pulmonary/Chest:  Lungs clear bilaterally but decreased breath sounds  Abdominal: Soft. Bowel sounds are normal. He exhibits no distension. There is no tenderness.  Musculoskeletal:  No movement on  the left side  Neurological: He is alert and oriented to person, place, and time. He has normal reflexes. No cranial nerve deficit. He exhibits normal muscle tone. Coordination normal.  Skin: Skin is warm and dry.  Psychiatric: He has a normal mood and affect.  Nursing note and vitals reviewed.   ED Course  Procedures  DIAGNOSTIC STUDIES:    Oxygen Saturation 98% on room air.   COORDINATION OF CARE:  11:44 AM Will review labs and imaging. Discussed treatment plan with pt at bedside and pt agreed to plan.   Results for orders placed or performed during the hospital encounter of 03/30/15  Ethanol  Result Value Ref Range   Alcohol, Ethyl (B) <5 <5 mg/dL  Protime-INR  Result Value Ref Range   Prothrombin Time 14.5 11.6 - 15.2 seconds   INR 1.11 0.00 - 1.49  APTT  Result Value Ref Range   aPTT 24 24 - 37 seconds  CBC  Result Value Ref Range   WBC 10.1 4.0 - 10.5 K/uL   RBC 5.03 4.22 - 5.81 MIL/uL   Hemoglobin 16.1 13.0 - 17.0 g/dL   HCT 16.1 09.6 - 04.5 %   MCV 91.7 78.0 - 100.0 fL   MCH 32.0 26.0 - 34.0 pg   MCHC 34.9 30.0 - 36.0 g/dL   RDW 40.9 81.1 - 91.4 %   Platelets 230 150 - 400 K/uL  Differential  Result Value Ref Range   Neutrophils Relative % 69 %   Neutro Abs 7.1 1.7 - 7.7 K/uL   Lymphocytes Relative 24 %   Lymphs Abs 2.4 0.7 - 4.0 K/uL   Monocytes Relative 6 %   Monocytes Absolute 0.6 0.1 - 1.0 K/uL   Eosinophils  Relative 1 %   Eosinophils Absolute 0.1 0.0 - 0.7 K/uL   Basophils Relative 0 %   Basophils Absolute 0.0 0.0 - 0.1 K/uL  Comprehensive metabolic panel  Result Value Ref Range   Sodium 135 135 - 145 mmol/L   Potassium 4.8 3.5 - 5.1 mmol/L   Chloride 101 101 - 111 mmol/L   CO2 25 22 - 32 mmol/L   Glucose, Bld 150 (H) 65 - 99 mg/dL   BUN 6 6 - 20 mg/dL   Creatinine, Ser 7.82 0.61 - 1.24 mg/dL   Calcium 9.2 8.9 - 95.6 mg/dL   Total Protein 8.2 (H) 6.5 - 8.1 g/dL   Albumin 3.7 3.5 - 5.0 g/dL   AST 88 (H) 15 - 41 U/L   ALT 80 (H) 17 - 63 U/L   Alkaline Phosphatase 71 38 - 126 U/L   Total Bilirubin 1.6 (H) 0.3 - 1.2 mg/dL   GFR calc non Af Amer >60 >60 mL/min   GFR calc Af Amer >60 >60 mL/min   Anion gap 9 5 - 15  Urine rapid drug screen (hosp performed)not at [facility]  Result Value Ref Range   Opiates POSITIVE (A) NONE DETECTED   Cocaine NONE DETECTED NONE DETECTED   Benzodiazepines POSITIVE (A) NONE DETECTED   Amphetamines NONE DETECTED NONE DETECTED   Tetrahydrocannabinol NONE DETECTED NONE DETECTED   Barbiturates NONE DETECTED NONE DETECTED  Urinalysis, Routine w reflex microscopic (not at [facility])  Result Value Ref Range   Color, Urine AMBER (A) YELLOW   APPearance CLEAR CLEAR   Specific Gravity, Urine 1.010 1.005 - 1.030   pH 8.5 (H) 5.0 - 8.0   Glucose, UA NEGATIVE NEGATIVE mg/dL   Hgb urine dipstick NEGATIVE NEGATIVE   Bilirubin Urine  NEGATIVE NEGATIVE   Ketones, ur NEGATIVE NEGATIVE mg/dL   Protein, ur NEGATIVE NEGATIVE mg/dL   Nitrite NEGATIVE NEGATIVE   Leukocytes, UA NEGATIVE NEGATIVE  CBG monitoring, ED  Result Value Ref Range   Glucose-Capillary 148 (H) 65 - 99 mg/dL   Ct Abdomen Pelvis Wo Contrast  03/30/2015  CLINICAL DATA:  Periumbilical abdominal pain EXAM: CT ABDOMEN AND PELVIS WITHOUT CONTRAST TECHNIQUE: Multidetector CT imaging of the abdomen and pelvis was performed following the standard protocol without IV contrast. COMPARISON:  06/07/2014 FINDINGS: Lung  bases are free of acute infiltrate or sizable effusion. Pulmonary nodule is again noted in the posterior right lower lobe stable from the prior exam. The liver, gallbladder, spleen, adrenal glands and pancreas are within normal limits. Mild aortic calcifications are seen. The appendix is well visualized and within normal limits. Minimal diverticular change of the colon is seen. The bladder is partially distended. No pelvic mass lesion or sidewall abnormality is seen. The osseous structures show no acute abnormality. IMPRESSION: No acute abnormality noted. Stable pulmonary nodule consistent with benign etiology as previously described. Electronically Signed   By: Alcide Clever M.D.   On: 03/30/2015 15:25   Ct Head Wo Contrast  03/30/2015  CLINICAL DATA:  Code stroke, morbid obesity EXAM: CT HEAD WITHOUT CONTRAST TECHNIQUE: Contiguous axial images were obtained from the base of the skull through the vertex without contrast. COMPARISON:  08/18/2014 FINDINGS: Limited exam with motion artifact. No acute intracranial hemorrhage, definite infarction, mass lesion, mass effect, focal edema. Normal gray-white matter differentiation. No extra-axial fluid collection demonstrated. Cisterns are patent. No cerebellar abnormality. Orbits are symmetric. Minor right sphenoid mucosal thickening. Other sinuses and mastoids remain clear. No skull abnormality. IMPRESSION: Limited exam but no acute intracranial process by noncontrast CT. These results were called by telephone at the time of interpretation on 03/30/2015 at 11:50 am to Dr. Vanetta Mulders , who verbally acknowledged these results. Electronically Signed   By: Judie Petit.  Shick M.D.   On: 03/30/2015 11:53   ED ECG REPORT   Date: 03/30/2015  Rate: 81  Rhythm: normal sinus rhythm  QRS Axis: right  Intervals: normal  ST/T Wave abnormalities: normal  Conduction Disutrbances:none  Narrative Interpretation:   Old EKG Reviewed: none available Abnormal R wave progression early  transition I have personally reviewed the EKG tracing and agree with the computerized printout as noted.   MDM   Final diagnoses:  Drug-seeking behavior  Generalized abdominal pain  Substance abuse  Psychosomatic disorder    Patient was being processed in the prison at approximately between 3 or 4 in the morning had acute onset of left-sided numbness and weakness. Patient reported history of stroke a year ago that affected right side. Patient was evaluated by nurse at the jail and stated that the the left hand weakness was improving. And there was some question of left facial drooping. Patient seen by me upon arrival are to being checked in code stroke was called and patient was sent directly to CT scan.  CT scan negative for any evidence of any acute or old stroke. No evidence of any head bleed. Patient was evaluated by neuro telemetry, there impression was that patient the was faking numerous symptoms. Did not feel patient was candidate for TPA clearly. Patient had intermittent neurological findings that did not make sense. Sometimes there was a speech problem sometimes there was an eye problem sometimes there was the left arm problem sometimes it was the left leg problem. Did not seem to  add up to a true CVA.   Patient now is moving all 4 extremities. Patient's complaint of some abdominal pain upon arrival which got worse while he was here. Patient was being placed in jail for a threatening phone call patient has a history of substance abuse urine drug screen positive for benzos and for narcotics. Patient states that he did abuse his narcotics daily. Patient has past history of drug-seeking behavior.   Patient's belly labs without significant abnormalities other than the urine drug screen. Her artery and some general some liver function test abnormalities with slight elevation in the AST ALT but they're both below 100. Total bili slightly elevated at 1.6. Otherwise no significant  abnormalities. EKG without any acute findings. CT of head is mentioned above is negative. Patient currently undergoing CT scan of the abdomen for evaluation of abdominal pain if it's negative patient can be discharged back to the jail. Patient was given 50 mics of fentanyl here and Zofran IV. Patient was going to have CT scan of abdomen done with oral and IV contrast but he ripped out his IV so we'll do it just the without contrast. Based on his large body habitus CT scan without contrast will be adequate.  CT scan of the abdomen without any acute findings. Patient cleared to be discharged back to the jail.  I personally performed the services described in this documentation, which was scribed in my presence. The recorded information has been reviewed and is accurate.       Vanetta Mulders, MD 03/30/15 1520  Vanetta Mulders, MD 03/30/15 1536

## 2015-03-30 NOTE — ED Notes (Signed)
Patient transported to CT 

## 2015-03-30 NOTE — ED Notes (Signed)
Back from CT at 1143

## 2015-03-30 NOTE — ED Notes (Signed)
Called SOC at 11:47

## 2015-03-30 NOTE — ED Notes (Signed)
Patient able to stand on scale without assistance.

## 2015-03-30 NOTE — ED Notes (Signed)
Patient given urinal to use, patient notably using left arm to maneuver urinal and blanket.

## 2015-03-30 NOTE — ED Notes (Signed)
Paged code stroke at 11:30

## 2015-03-30 NOTE — ED Notes (Signed)
Nurse from [facility] states that pt got there last night. Nurse stated that pt stated left sided weakness began ~0400. Nurse states weak grasp to left hand and left sided facial drooping. EDP met pt at bridge on arrival and cleared pt  to go directly to CT. Pt accompanied by Deputy

## 2015-03-30 NOTE — ED Notes (Signed)
Patient ambulated to restroom with assistance from officer, gait steady. Tolerated well.

## 2015-03-30 NOTE — ED Notes (Signed)
Patient now alert and oriented. Moving all extremities with ease.

## 2015-03-30 NOTE — Progress Notes (Signed)
Phone call 11 : 20 am Beeper 11:29 am 11 :42 scan finished 11:44 called [facility] talked to Dr. Felix Ahmadi

## 2015-03-30 NOTE — ED Notes (Signed)
Pt cleared for CT by EDP 

## 2015-03-30 NOTE — Discharge Instructions (Signed)
Workup for the potential stroke without any obvious findings. Seems to be psycho somatic in nature. Workup for the abdominal pain without any acute findings negative CT scan of the abdomen. Take the Naprosyn as needed. Symptoms may be related to the narcotic withdrawal symptoms. Patient cleared to go back to jail.

## 2015-03-30 NOTE — ED Notes (Signed)
Patient accidentally pulled IV out while moving in bed. EDP made aware-patient to have CT without contrast. CT made aware.

## 2015-03-31 ENCOUNTER — Encounter (HOSPITAL_COMMUNITY): Payer: Self-pay | Admitting: Emergency Medicine

## 2015-03-31 ENCOUNTER — Emergency Department (HOSPITAL_COMMUNITY)
Admission: EM | Admit: 2015-03-31 | Discharge: 2015-04-01 | Disposition: A | Discharge status: Home / Self Care | Attending: Emergency Medicine | Admitting: Emergency Medicine

## 2015-03-31 DIAGNOSIS — F419 Anxiety disorder, unspecified: Secondary | ICD-10-CM | POA: Insufficient documentation

## 2015-03-31 DIAGNOSIS — E785 Hyperlipidemia, unspecified: Secondary | ICD-10-CM | POA: Insufficient documentation

## 2015-03-31 DIAGNOSIS — J45909 Unspecified asthma, uncomplicated: Secondary | ICD-10-CM | POA: Diagnosis not present

## 2015-03-31 DIAGNOSIS — Z7951 Long term (current) use of inhaled steroids: Secondary | ICD-10-CM | POA: Insufficient documentation

## 2015-03-31 DIAGNOSIS — Z79899 Other long term (current) drug therapy: Secondary | ICD-10-CM | POA: Insufficient documentation

## 2015-03-31 DIAGNOSIS — F17210 Nicotine dependence, cigarettes, uncomplicated: Secondary | ICD-10-CM | POA: Diagnosis not present

## 2015-03-31 DIAGNOSIS — F319 Bipolar disorder, unspecified: Secondary | ICD-10-CM | POA: Diagnosis not present

## 2015-03-31 DIAGNOSIS — Z8669 Personal history of other diseases of the nervous system and sense organs: Secondary | ICD-10-CM | POA: Diagnosis not present

## 2015-03-31 DIAGNOSIS — E119 Type 2 diabetes mellitus without complications: Secondary | ICD-10-CM | POA: Diagnosis not present

## 2015-03-31 DIAGNOSIS — F1123 Opioid dependence with withdrawal: Secondary | ICD-10-CM | POA: Diagnosis not present

## 2015-03-31 DIAGNOSIS — I252 Old myocardial infarction: Secondary | ICD-10-CM | POA: Diagnosis not present

## 2015-03-31 DIAGNOSIS — Z7982 Long term (current) use of aspirin: Secondary | ICD-10-CM | POA: Insufficient documentation

## 2015-03-31 DIAGNOSIS — F900 Attention-deficit hyperactivity disorder, predominantly inattentive type: Secondary | ICD-10-CM | POA: Diagnosis not present

## 2015-03-31 DIAGNOSIS — R0789 Other chest pain: Secondary | ICD-10-CM | POA: Diagnosis not present

## 2015-03-31 DIAGNOSIS — I10 Essential (primary) hypertension: Secondary | ICD-10-CM | POA: Diagnosis not present

## 2015-03-31 DIAGNOSIS — F1193 Opioid use, unspecified with withdrawal: Secondary | ICD-10-CM

## 2015-03-31 DIAGNOSIS — R197 Diarrhea, unspecified: Secondary | ICD-10-CM | POA: Diagnosis not present

## 2015-03-31 DIAGNOSIS — Z791 Long term (current) use of non-steroidal anti-inflammatories (NSAID): Secondary | ICD-10-CM | POA: Insufficient documentation

## 2015-03-31 DIAGNOSIS — R112 Nausea with vomiting, unspecified: Secondary | ICD-10-CM | POA: Diagnosis not present

## 2015-03-31 DIAGNOSIS — Z8673 Personal history of transient ischemic attack (TIA), and cerebral infarction without residual deficits: Secondary | ICD-10-CM | POA: Insufficient documentation

## 2015-03-31 DIAGNOSIS — R079 Chest pain, unspecified: Secondary | ICD-10-CM | POA: Diagnosis present

## 2015-03-31 LAB — I-STAT TROPONIN, ED: TROPONIN I, POC: 0 ng/mL (ref 0.00–0.08)

## 2015-03-31 NOTE — ED Provider Notes (Signed)
CSN: [id number]     Arrival date & time 03/31/15  2328 History  By signing my name below, I, Linus Galas, attest that this documentation has been prepared under the direction and in the presence of Devoria Albe, MD at 2350. Electronically Signed: Linus Galas, ED Scribe. 04/01/2015. 4:16 AM.   Chief Complaint  Patient presents with  . Chest Pain   HPI HPI Comments: TRACER GUTRIDGE is a 40 y.o. male with a h/o DM, HTN and MI who presents to the Emergency Department complaining of sharp cental chest pain that began 3 hours, PTA. Pt states he was walking around the jail corridor when his pain began. Pt describes his pain as a "ripping pain like something pulling inside." He reports his pain shoots down " the left side of his abdomen and down into his left leg into his toes." Pt states his pain is worse with deep breaths and movement. Pt denies any alleviating factors. Pt denies h/o same. Pt reports SOB, nausea, vomiting x3. Pt reports an MI 2 years ago but did not follow up with cardiology. Pt denies ETOH abuse. Pt is a non-smoker.   Pt also reports he has  bulging disc and reports he is "suppose to have back surgery tomorrow." Pt states he sees [facility] for his chronic back pain.   PCP Dr [facility] Back specialist [facility]  Past Medical History  Diagnosis Date  . Heart valve disorder     s/p echocardiogram  . Hypertension   . Asthma   . Diabetes mellitus   . Hyperlipidemia   . Anaphylactic reaction   . Bipolar disorder (HCC)   . Adult ADHD   . Morbidly obese (HCC)   . Transient cerebral ischemia     Unknown  . OSA (obstructive sleep apnea)   . Anxiety    Past Surgical History  Procedure Laterality Date  . Carpal tunnel release    . Nose surgery    . Wisdom tooth extraction    . Tooth extraction     Family History  Problem Relation Age of Onset  . CAD Mother     Living  . Diabetes Mellitus II Mother   . Stroke Mother    . Hypertension Mother   . Congestive Heart Failure Mother   . Kidney disease Mother   . Fibromyalgia Mother   . Thyroid disease Mother   . Hyperlipidemia Mother   . Liver disease Mother   . Alcoholism Father 7    Deceased  . Arthritis Maternal Grandmother   . Congestive Heart Failure Maternal Grandmother   . Hypertension Maternal Grandmother   . Lung cancer Maternal Grandfather   . Colon cancer Maternal Aunt   . Stomach cancer Maternal Aunt   . Heart disease Other     Paternal & Maternal  . Hypertension Other     Paternal & Maternal  . Hypertension Brother     x3  . Hypertension Sister     #1  . Bipolar disorder Sister     #1  . ADD / ADHD Son     x3  . Bipolar disorder Son     x3  . Asperger's syndrome Son    Social History  Substance Use Topics  . Smoking status: Current Every Day Smoker -- 1.00 packs/day for 23 years    Types: Cigarettes  . Smokeless tobacco: Never Used  . Alcohol Use: 0.0 oz/week    0 Standard drinks or equivalent per  week     Comment: rare  Pt was placed in jail yesterday about 3 am  Review of Systems  All other systems reviewed and are negative.     Allergies  Atenolol; Other; and Tylenol  Home Medications   Prior to Admission medications   Medication Sig Start Date End Date Taking? Authorizing Provider  albuterol (PROVENTIL HFA;VENTOLIN HFA) 108 (90 BASE) MCG/ACT inhaler Inhale 2 puffs into the lungs every 6 (six) hours as needed for wheezing or shortness of breath. 03/06/14   Waldon Merl, PA-C  ALPRAZolam Prudy Feeler) 1 MG tablet Take 1 mg by mouth 4 (four) times daily.     Historical Provider, MD  amphetamine-dextroamphetamine (ADDERALL) 30 MG tablet Take 30 mg by mouth 2 (two) times daily.    Historical Provider, MD  aspirin 325 MG tablet Take 1 tablet (325 mg total) by mouth daily. 09/04/13   [name], MD  cholecalciferol (VITAMIN D) 1000 UNITS tablet Take 1,000 Units by mouth daily.    Historical Provider, MD  citalopram  (CELEXA) 10 MG tablet Take 1 tablet (10 mg total) by mouth daily. Patient not taking: Reported on 03/30/2015 03/06/14   Waldon Merl, PA-C  cloNIDine (CATAPRES) 0.2 MG tablet Take 1 tablet (0.2 mg total) by mouth at bedtime. 03/06/14   Waldon Merl, PA-C  Fluticasone-Salmeterol (ADVAIR) 250-50 MCG/DOSE AEPB Inhale 1 puff into the lungs every 12 (twelve) hours. 03/06/14   Waldon Merl, PA-C  furosemide (LASIX) 20 MG tablet Take 1 tablet (20 mg total) by mouth daily. Patient not taking: Reported on 05/26/2014 03/06/14   Waldon Merl, PA-C  gabapentin (NEURONTIN) 800 MG tablet Take 2,400 mg by mouth 2 (two) times daily.    Historical Provider, MD  lisinopril (PRINIVIL,ZESTRIL) 40 MG tablet Take 40 mg by mouth 2 (two) times daily. 05/17/14   Historical Provider, MD  metFORMIN (GLUCOPHAGE) 500 MG tablet Take 1 tablet (500 mg total) by mouth 2 (two) times daily with a meal. Patient not taking: Reported on 05/26/2014 03/06/14   Waldon Merl, PA-C  naproxen (NAPROSYN) 500 MG tablet Take 1 tablet (500 mg total) by mouth 2 (two) times daily. 03/30/15   Vanetta Mulders, MD  omeprazole (PRILOSEC) 20 MG capsule Take 1 capsule (20 mg total) by mouth daily. 03/06/14   Waldon Merl, PA-C  oxycodone (ROXICODONE) 30 MG immediate release tablet Take 30 mg by mouth every 4 (four) hours as needed. For pain 08/12/14   Historical Provider, MD  pravastatin (PRAVACHOL) 40 MG tablet Take 1 tablet (40 mg total) by mouth at bedtime. Patient not taking: Reported on 05/26/2014 03/06/14   Waldon Merl, PA-C  PRESCRIPTION MEDICATION Take 1 tablet by mouth daily. Cholesterol Medication.    Historical Provider, MD  sitaGLIPtin-metformin (JANUMET) 50-500 MG per tablet Take 1 tablet by mouth 2 (two) times daily with a meal.    Historical Provider, MD  zolpidem (AMBIEN) 10 MG tablet Take 10 mg by mouth at bedtime as needed for sleep.    Historical Provider, MD   BP 141/99 mmHg  Pulse 76  Resp 19  SpO2 100%  Vital signs  normal   Physical Exam  Constitutional: He is oriented to person, place, and time. He appears well-developed and well-nourished.  Non-toxic appearance. He does not appear ill. No distress.  Morbidly obese  HENT:  Head: Normocephalic and atraumatic.  Right Ear: External ear normal.  Left Ear: External ear normal.  Nose: Nose normal. No mucosal edema or  rhinorrhea.  Mouth/Throat: Oropharynx is clear and moist and mucous membranes are normal. No dental abscesses or uvula swelling.  Eyes: Conjunctivae and EOM are normal. Pupils are equal, round, and reactive to light.  Neck: Normal range of motion and full passive range of motion without pain. Neck supple.  Cardiovascular: Normal rate, regular rhythm and normal heart sounds.  Exam reveals no gallop and no friction rub.   No murmur heard. Pulmonary/Chest: Effort normal and breath sounds normal. No respiratory distress. He has no wheezes. He has no rhonchi. He has no rales. He exhibits no tenderness and no crepitus.    Left chest TTP, area that patient states is hurting is noted  Abdominal: Soft. Normal appearance and bowel sounds are normal. He exhibits no distension. There is no tenderness. There is no rebound and no guarding.    Left abdomen tender without localization Area of pain patient indicates is hurting is noted   Musculoskeletal: Normal range of motion. He exhibits no edema or tenderness.       Legs: Moves all extremities well.  Feet are modeling of skin but feet are warm.   Neurological: He is alert and oriented to person, place, and time. He has normal strength. No cranial nerve deficit.  Skin: Skin is warm, dry and intact. No rash noted. No erythema. No pallor.  Psychiatric: His behavior is normal. His mood appears anxious. His speech is rapid and/or pressured.  Anxious  Nursing note and vitals reviewed.   ED Course  Procedures   Medications  dicyclomine (BENTYL) tablet 20 mg (not administered)  hydrOXYzine  (ATARAX/VISTARIL) tablet 50 mg (50 mg Oral Given 04/01/15 0045)  loperamide (IMODIUM) capsule 2-4 mg (not administered)  methocarbamol (ROBAXIN) tablet 1,000 mg (not administered)  naproxen (NAPROSYN) tablet 500 mg (not administered)  ondansetron (ZOFRAN-ODT) disintegrating tablet 4 mg (not administered)  cloNIDine (CATAPRES) tablet 0.1 mg (0.1 mg Oral Given by Other 04/01/15 0049)    Followed by  cloNIDine (CATAPRES) tablet 0.1 mg (0.1 mg Oral Given 04/01/15 0046)    Followed by  cloNIDine (CATAPRES) tablet 0.1 mg (not administered)  sodium chloride 0.9 % bolus 1,000 mL (not administered)  ondansetron (ZOFRAN) injection 4 mg (4 mg Intravenous Given 04/01/15 0046)  dicyclomine (BENTYL) injection 20 mg (20 mg Intramuscular Given 04/01/15 0048)  ketorolac (TORADOL) 30 MG/ML injection 30 mg (30 mg Intravenous Given 04/01/15 0132)  sodium chloride 0.9 % bolus 1,000 mL (1,000 mLs Intravenous New Bag/Given 04/01/15 0155)    DIAGNOSTIC STUDIES: Oxygen Saturation is 100% on room air, normal by my interpretation.    COORDINATION OF CARE: As I was leaving the room the patient starts demanding medication for his back. I told him that we do not treat chronic pain in the ED. At which point he states he doesn't have chronic back pain however he is scheduled to have surgery tomorrow for a bulging disc in his back. On review of patient's ED visit yesterday he came into the ED for possible code stroke however his symptoms did not fit pathology seen with a stroke and he was felt to be feigning symptoms by the telaneurologist. He also was complaining of abdominal pain yesterday and had an abdominal CT scan done at that time. Patient was just put in jail yesterday morning at 3 AM and this is his second ED visit in 24 hours. Last night he admitted to some narcotic abuse and he had a positive UDS for narcotics and benzodiazepines. However reviewing the [employer] shows  he last got #60 oxycodone 5 mg tablets on  October 13 and #90 alprazolam 1 mg tablets on September 10. These were both prescribed by PA Onalee Hua minor [facility] [place].  Patient was started on narcotic withdrawal protocol for presumed withdrawal symptoms.   Recheck at 2:40 AM patient lying quietly in the stretcher now. He states he is getting narcotics and his benzodiazepines from Dr. Brock Bad and states he just got them filled recently. He states he has been given medication at the jail however they do not have his medication list with him. His initial narcotic withdrawal score was a 13. His second one done at 2:52 AM was 8 which is improved. I think patient has a mild drop in his bicarbonate from the diarrhea he's been having. He was given IV fluids in the ED which should correct that. He has a normal anion gap. I will order return him to the jail and suggest they increase his withdrawal medications.`  Patient second troponin is negative. At this point he is felt stable to be discharged from the ED.  Labs Review Results for orders placed or performed during the hospital encounter of 03/31/15  Comprehensive metabolic panel  Result Value Ref Range   Sodium 137 135 - 145 mmol/L   Potassium 3.7 3.5 - 5.1 mmol/L   Chloride 105 101 - 111 mmol/L   CO2 20 (L) 22 - 32 mmol/L   Glucose, Bld 152 (H) 65 - 99 mg/dL   BUN 14 6 - 20 mg/dL   Creatinine, Ser 1.61 0.61 - 1.24 mg/dL   Calcium 9.5 8.9 - 09.6 mg/dL   Total Protein 8.4 (H) 6.5 - 8.1 g/dL   Albumin 4.0 3.5 - 5.0 g/dL   AST 85 (H) 15 - 41 U/L   ALT 67 (H) 17 - 63 U/L   Alkaline Phosphatase 68 38 - 126 U/L   Total Bilirubin 1.6 (H) 0.3 - 1.2 mg/dL   GFR calc non Af Amer >60 >60 mL/min   GFR calc Af Amer >60 >60 mL/min   Anion gap 12 5 - 15  Lipase, blood  Result Value Ref Range   Lipase 34 11 - 51 U/L  Troponin I  Result Value Ref Range   Troponin I <0.03 <0.031 ng/mL  CBC with Differential  Result Value Ref Range   WBC 13.9 (H)  4.0 - 10.5 K/uL   RBC 5.40 4.22 - 5.81 MIL/uL   Hemoglobin 17.4 (H) 13.0 - 17.0 g/dL   HCT 04.5 40.9 - 81.1 %   MCV 91.1 78.0 - 100.0 fL   MCH 32.2 26.0 - 34.0 pg   MCHC 35.4 30.0 - 36.0 g/dL   RDW 91.4 78.2 - 95.6 %   Platelets 297 150 - 400 K/uL   Neutrophils Relative % 65 %   Neutro Abs 9.0 (H) 1.7 - 7.7 K/uL   Lymphocytes Relative 27 %   Lymphs Abs 3.7 0.7 - 4.0 K/uL   Monocytes Relative 7 %   Monocytes Absolute 1.0 0.1 - 1.0 K/uL   Eosinophils Relative 1 %   Eosinophils Absolute 0.1 0.0 - 0.7 K/uL   Basophils Relative 0 %   Basophils Absolute 0.0 0.0 - 0.1 K/uL   WBC Morphology ATYPICAL LYMPHOCYTES    RBC Morphology TEARDROP CELLS   Brain natriuretic peptide  Result Value Ref Range   B Natriuretic Peptide 16.0 0.0 - 100.0 pg/mL  I-stat troponin, ED  Result Value Ref  Range   Troponin i, poc 0.00 0.00 - 0.08 ng/mL   Comment 3           Laboratory interpretation all normal except minor elevation of LFTs, leukocytosis     Imaging Review  Dg Chest Port 1 View  04/01/2015  CLINICAL DATA:  Sharp central and left-sided chest pain. Shortness of breath. Symptom onset 3 hours prior. EXAM: PORTABLE CHEST 1 VIEW COMPARISON:  Radiographs 08/18/2014 FINDINGS: The cardiomediastinal contours are normal. Central bronchial thickening unchanged. Pulmonary vasculature is normal. No consolidation, pleural effusion, or pneumothorax. No acute osseous abnormalities are seen. IMPRESSION: No acute pulmonary process.  Stable bronchitic change. Electronically Signed   By: Rubye Oaks M.D.   On: 04/01/2015 00:54   Ct Abdomen Pelvis Wo Contrast  03/30/2015  CLINICAL DATA:  Periumbilical abdominal pain EXAM: CT ABDOMEN AND PELVIS WITHOUT CONTRAST TECHNIQUE: Multidetector CT imaging of the abdomen and pelvis was performed following the standard protocol without IV contrast. COMPARISON:  06/07/2014 FINDINGS: Lung bases are free of acute infiltrate or sizable effusion. Pulmonary nodule is again noted  in the posterior right lower lobe stable from the prior exam. The liver, gallbladder, spleen, adrenal glands and pancreas are within normal limits. Mild aortic calcifications are seen. The appendix is well visualized and within normal limits. Minimal diverticular change of the colon is seen. The bladder is partially distended. No pelvic mass lesion or sidewall abnormality is seen. The osseous structures show no acute abnormality. IMPRESSION: No acute abnormality noted. Stable pulmonary nodule consistent with benign etiology as previously described. Electronically Signed   By: Alcide Clever M.D.   On: 03/30/2015 15:25   Ct Head Wo Contrast  03/30/2015  CLINICAL DATA:  Code stroke, morbid obesity EXAM: CT HEAD WITHOUT CONTRAST TECHNIQUE: Contiguous axial images were obtained from the base of the skull through the vertex without contrast. COMPARISON:  08/18/2014 FINDINGS: Limited exam with motion artifact. No acute intracranial hemorrhage, definite infarction, mass lesion, mass effect, focal edema. Normal gray-white matter differentiation. No extra-axial fluid collection demonstrated. Cisterns are patent. No cerebellar abnormality. Orbits are symmetric. Minor right sphenoid mucosal thickening. Other sinuses and mastoids remain clear. No skull abnormality. IMPRESSION: Limited exam but no acute intracranial process by noncontrast CT. These results were called by telephone at the time of interpretation on 03/30/2015 at 11:50 am to Dr. Vanetta Mulders , who verbally acknowledged these results. Electronically Signed   By: Judie Petit.  Shick M.D.   On: 03/30/2015 11:53   I have personally reviewed and evaluated these images and lab results as part of my medical decision-making.   ED ECG REPORT   Date: 04/01/2015  Rate: 75  Rhythm: normal sinus rhythm  QRS Axis: normal  Intervals: normal  ST/T Wave abnormalities: normal  Conduction Disutrbances:none  Narrative Interpretation: baseline wander  Old EKG Reviewed:  unchanged from 03/30/2015  I have personally reviewed the EKG tracing and agree with the computerized printout as noted.   MDM   Final diagnoses:  Atypical chest pain  Narcotic withdrawal (HCC)  Nausea vomiting and diarrhea     Plan discharge  Devoria Albe, MD, Concha Pyo, MD 04/01/15 [phone]

## 2015-03-31 NOTE — ED Notes (Signed)
Pt c/o central chest pain that radiates down left leg x 4 hours with n/v and sob.

## 2015-04-01 ENCOUNTER — Emergency Department (HOSPITAL_COMMUNITY)

## 2015-04-01 LAB — CBC WITH DIFFERENTIAL/PLATELET
BASOS ABS: 0 10*3/uL (ref 0.0–0.1)
BASOS PCT: 0 %
Eosinophils Absolute: 0.1 10*3/uL (ref 0.0–0.7)
Eosinophils Relative: 1 %
HCT: 49.2 % (ref 39.0–52.0)
Hemoglobin: 17.4 g/dL — ABNORMAL HIGH (ref 13.0–17.0)
Lymphocytes Relative: 27 %
Lymphs Abs: 3.7 10*3/uL (ref 0.7–4.0)
MCH: 32.2 pg (ref 26.0–34.0)
MCHC: 35.4 g/dL (ref 30.0–36.0)
MCV: 91.1 fL (ref 78.0–100.0)
MONO ABS: 1 10*3/uL (ref 0.1–1.0)
Monocytes Relative: 7 %
NEUTROS ABS: 9 10*3/uL — AB (ref 1.7–7.7)
NEUTROS PCT: 65 %
PLATELETS: 297 10*3/uL (ref 150–400)
RBC: 5.4 MIL/uL (ref 4.22–5.81)
RDW: 12.9 % (ref 11.5–15.5)
WBC: 13.9 10*3/uL — AB (ref 4.0–10.5)

## 2015-04-01 LAB — COMPREHENSIVE METABOLIC PANEL
ALT: 67 U/L — ABNORMAL HIGH (ref 17–63)
ANION GAP: 12 (ref 5–15)
AST: 85 U/L — ABNORMAL HIGH (ref 15–41)
Albumin: 4 g/dL (ref 3.5–5.0)
Alkaline Phosphatase: 68 U/L (ref 38–126)
BUN: 14 mg/dL (ref 6–20)
CHLORIDE: 105 mmol/L (ref 101–111)
CO2: 20 mmol/L — ABNORMAL LOW (ref 22–32)
Calcium: 9.5 mg/dL (ref 8.9–10.3)
Creatinine, Ser: 0.87 mg/dL (ref 0.61–1.24)
Glucose, Bld: 152 mg/dL — ABNORMAL HIGH (ref 65–99)
POTASSIUM: 3.7 mmol/L (ref 3.5–5.1)
SODIUM: 137 mmol/L (ref 135–145)
Total Bilirubin: 1.6 mg/dL — ABNORMAL HIGH (ref 0.3–1.2)
Total Protein: 8.4 g/dL — ABNORMAL HIGH (ref 6.5–8.1)

## 2015-04-01 LAB — TROPONIN I

## 2015-04-01 LAB — BRAIN NATRIURETIC PEPTIDE: B NATRIURETIC PEPTIDE 5: 16 pg/mL (ref 0.0–100.0)

## 2015-04-01 LAB — I-STAT TROPONIN, ED: Troponin i, poc: 0 ng/mL (ref 0.00–0.08)

## 2015-04-01 LAB — LIPASE, BLOOD: LIPASE: 34 U/L (ref 11–51)

## 2015-04-01 MED ORDER — DICYCLOMINE HCL 20 MG PO TABS
20.0000 mg | ORAL_TABLET | Freq: Four times a day (QID) | ORAL | Status: DC | PRN
  Filled 2015-04-01: qty 1

## 2015-04-01 MED ORDER — ONDANSETRON HCL 4 MG/2ML IJ SOLN
4.0000 mg | Freq: Once | INTRAMUSCULAR | Status: AC
  Administered 2015-04-01: 4 mg via INTRAVENOUS
  Filled 2015-04-01: qty 2

## 2015-04-01 MED ORDER — CLONIDINE HCL 0.1 MG PO TABS: 0.1000 mg | ORAL_TABLET | Freq: Every day | ORAL | Status: DC

## 2015-04-01 MED ORDER — ONDANSETRON 4 MG PO TBDP: 4.0000 mg | ORAL_TABLET | Freq: Four times a day (QID) | ORAL | Status: DC | PRN

## 2015-04-01 MED ORDER — LOPERAMIDE HCL 2 MG PO CAPS: 2.0000 mg | ORAL_CAPSULE | ORAL | Status: DC | PRN

## 2015-04-01 MED ORDER — NAPROXEN 250 MG PO TABS: 500.0000 mg | ORAL_TABLET | Freq: Two times a day (BID) | ORAL | Status: DC | PRN

## 2015-04-01 MED ORDER — KETOROLAC TROMETHAMINE 30 MG/ML IJ SOLN
30.0000 mg | Freq: Once | INTRAMUSCULAR | Status: AC
  Administered 2015-04-01: 30 mg via INTRAVENOUS
  Filled 2015-04-01: qty 1

## 2015-04-01 MED ORDER — HYDROXYZINE HCL 25 MG PO TABS
50.0000 mg | ORAL_TABLET | Freq: Four times a day (QID) | ORAL | Status: DC | PRN
  Administered 2015-04-01: 50 mg via ORAL
  Filled 2015-04-01: qty 2

## 2015-04-01 MED ORDER — CLONIDINE HCL 0.1 MG PO TABS
0.1000 mg | ORAL_TABLET | ORAL | Status: DC
  Administered 2015-04-01: 0.1 mg via ORAL

## 2015-04-01 MED ORDER — DICYCLOMINE HCL 10 MG/ML IM SOLN
20.0000 mg | Freq: Once | INTRAMUSCULAR | Status: AC
  Administered 2015-04-01: 20 mg via INTRAMUSCULAR
  Filled 2015-04-01: qty 2

## 2015-04-01 MED ORDER — SODIUM CHLORIDE 0.9 % IV BOLUS (SEPSIS)
1000.0000 mL | Freq: Once | INTRAVENOUS | Status: AC
  Administered 2015-04-01: 1000 mL via INTRAVENOUS

## 2015-04-01 MED ORDER — CLONIDINE HCL 0.1 MG PO TABS
0.1000 mg | ORAL_TABLET | Freq: Four times a day (QID) | ORAL | Status: DC
  Administered 2015-04-01: 0.1 mg via ORAL
  Filled 2015-04-01: qty 1

## 2015-04-01 MED ORDER — METHOCARBAMOL 500 MG PO TABS: 1000.0000 mg | ORAL_TABLET | Freq: Four times a day (QID) | ORAL | Status: DC | PRN

## 2015-04-01 MED ORDER — SODIUM CHLORIDE 0.9 % IV BOLUS (SEPSIS): 1000.0000 mL | Freq: Once | INTRAVENOUS | Status: DC

## 2015-04-01 NOTE — Discharge Instructions (Signed)
He is having narcotic and possibly benzodiazepine withdrawal, he is probably going to need more medications for his withdrawal symptoms while in jail. Monitor his blood sugars closely about 3-4 times a day. Recheck as needed.    Opioid Withdrawal Opioids are a group of narcotic drugs. They include the street drug heroin. They also include pain medicines, such as morphine, hydrocodone, oxycodone, and fentanyl. Opioid withdrawal is a group of characteristic physical and mental signs and symptoms. It typically occurs if you have been using opioids daily for several weeks or longer and stop using or rapidly decrease use. Opioid withdrawal can also occur if you have used opioids daily for a long time and are given a medicine to block the effect.  SIGNS AND SYMPTOMS Opioid withdrawal includes three or more of the following symptoms:   Depressed, anxious, or irritable mood.  Nausea or vomiting.  Muscle aches or spasms.   Watery eyes.   Runny nose.  Dilated pupils, sweating, or hairs standing on end.  Diarrhea or intestinal cramping.  Yawning.   Fever.  Increased blood pressure.  Fast pulse.  Restlessness or trouble sleeping. These signs and symptoms occur within several hours of stopping or reducing short-acting opioids, such as heroin. They can occur within 3 days of stopping or reducing long-acting opioids, such as methadone. Withdrawal begins within minutes of receiving a drug that blocks the effects of opioids, such as naltrexone or naloxone. DIAGNOSIS  Opioid use disorder is diagnosed by your health care provider. You will be asked about your symptoms, drug and alcohol use, medical history, and use of medicines. A physical exam may be done. Lab tests may be ordered. Your health care provider may have you see a mental health professional.  TREATMENT  The treatment for opioid withdrawal is usually provided by medical doctors with special training in substance use disorders  (addiction specialists). The following medicines may be included in treatment:  Opioids given in place of the abused opioid. They turn on opioid receptors in the brain and lessen or prevent withdrawal symptoms. They are gradually decreased (opioid substitution and taper).  Non-opioids that can lessen certain opioid withdrawal symptoms. They may be used alone or with opioid substitution and taper. Successful long-term recovery usually requires medicine, counseling, and group support. HOME CARE INSTRUCTIONS   Take medicines only as directed by your health care provider.  Check with your health care provider before starting new medicines.  Keep all follow-up visits as directed by your health care provider. SEEK MEDICAL CARE IF:  You are not able to take your medicines as directed.  Your symptoms get worse.  You relapse. SEEK IMMEDIATE MEDICAL CARE IF:  You have serious thoughts about hurting yourself or others.  You have a seizure.  You lose consciousness.   This information is not intended to replace advice given to you by your health care provider. Make sure you discuss any questions you have with your health care provider.   Document Released: 02/18/2003 Document Revised: 03/08/2014 Document Reviewed: 02/28/2013 Elsevier Interactive Patient Education [employer]

## 2015-04-04 ENCOUNTER — Encounter (HOSPITAL_COMMUNITY): Payer: Self-pay

## 2015-04-04 ENCOUNTER — Emergency Department (HOSPITAL_COMMUNITY)

## 2015-04-04 DIAGNOSIS — X58XXXA Exposure to other specified factors, initial encounter: Secondary | ICD-10-CM | POA: Insufficient documentation

## 2015-04-04 DIAGNOSIS — T189XXA Foreign body of alimentary tract, part unspecified, initial encounter: Secondary | ICD-10-CM | POA: Diagnosis present

## 2015-04-04 DIAGNOSIS — F319 Bipolar disorder, unspecified: Secondary | ICD-10-CM | POA: Insufficient documentation

## 2015-04-04 DIAGNOSIS — E119 Type 2 diabetes mellitus without complications: Secondary | ICD-10-CM | POA: Insufficient documentation

## 2015-04-04 DIAGNOSIS — Y998 Other external cause status: Secondary | ICD-10-CM | POA: Insufficient documentation

## 2015-04-04 DIAGNOSIS — F17210 Nicotine dependence, cigarettes, uncomplicated: Secondary | ICD-10-CM | POA: Insufficient documentation

## 2015-04-04 DIAGNOSIS — T182XXA Foreign body in stomach, initial encounter: Secondary | ICD-10-CM | POA: Diagnosis not present

## 2015-04-04 DIAGNOSIS — Z79899 Other long term (current) drug therapy: Secondary | ICD-10-CM | POA: Insufficient documentation

## 2015-04-04 DIAGNOSIS — F909 Attention-deficit hyperactivity disorder, unspecified type: Secondary | ICD-10-CM | POA: Insufficient documentation

## 2015-04-04 DIAGNOSIS — Z7982 Long term (current) use of aspirin: Secondary | ICD-10-CM | POA: Diagnosis not present

## 2015-04-04 DIAGNOSIS — Y9389 Activity, other specified: Secondary | ICD-10-CM | POA: Insufficient documentation

## 2015-04-04 DIAGNOSIS — I10 Essential (primary) hypertension: Secondary | ICD-10-CM | POA: Insufficient documentation

## 2015-04-04 DIAGNOSIS — Z791 Long term (current) use of non-steroidal anti-inflammatories (NSAID): Secondary | ICD-10-CM | POA: Insufficient documentation

## 2015-04-04 DIAGNOSIS — Y9289 Other specified places as the place of occurrence of the external cause: Secondary | ICD-10-CM | POA: Diagnosis not present

## 2015-04-04 DIAGNOSIS — Z8673 Personal history of transient ischemic attack (TIA), and cerebral infarction without residual deficits: Secondary | ICD-10-CM | POA: Insufficient documentation

## 2015-04-04 DIAGNOSIS — F1310 Sedative, hypnotic or anxiolytic abuse, uncomplicated: Secondary | ICD-10-CM | POA: Diagnosis not present

## 2015-04-04 DIAGNOSIS — Z7951 Long term (current) use of inhaled steroids: Secondary | ICD-10-CM | POA: Diagnosis not present

## 2015-04-04 DIAGNOSIS — Z7984 Long term (current) use of oral hypoglycemic drugs: Secondary | ICD-10-CM | POA: Diagnosis not present

## 2015-04-04 NOTE — ED Notes (Signed)
Pt states he swallowed approx 6 AA batteries while in the jail.  When questioned about why he did this, he states he got news that his son was killed in a car accident, but he cannot verify if it is true.

## 2015-04-05 ENCOUNTER — Emergency Department (HOSPITAL_COMMUNITY)
Admission: EM | Admit: 2015-04-05 | Discharge: 2015-04-05 | Disposition: A | Discharge status: Home / Self Care | Attending: Emergency Medicine | Admitting: Emergency Medicine

## 2015-04-05 ENCOUNTER — Encounter (HOSPITAL_COMMUNITY): Payer: Self-pay | Admitting: *Deleted

## 2015-04-05 ENCOUNTER — Encounter (HOSPITAL_COMMUNITY)
Admission: EM | Disposition: A | Discharge status: Home / Self Care | Payer: Self-pay | Source: Home / Self Care | Attending: Emergency Medicine

## 2015-04-05 DIAGNOSIS — T182XXA Foreign body in stomach, initial encounter: Secondary | ICD-10-CM | POA: Diagnosis not present

## 2015-04-05 DIAGNOSIS — T189XXA Foreign body of alimentary tract, part unspecified, initial encounter: Secondary | ICD-10-CM

## 2015-04-05 DIAGNOSIS — Z5309 Procedure and treatment not carried out because of other contraindication: Secondary | ICD-10-CM | POA: Diagnosis not present

## 2015-04-05 HISTORY — DX: Other complications of anesthesia, initial encounter: T88.59XA

## 2015-04-05 HISTORY — DX: Adverse effect of unspecified anesthetic, initial encounter: T41.45XA

## 2015-04-05 HISTORY — PX: ESOPHAGOGASTRODUODENOSCOPY: SHX5428

## 2015-04-05 HISTORY — DX: Failed or difficult intubation, initial encounter: T88.4XXA

## 2015-04-05 LAB — COMPREHENSIVE METABOLIC PANEL
ALK PHOS: 71 U/L (ref 38–126)
ALT: 76 U/L — AB (ref 17–63)
AST: 53 U/L — AB (ref 15–41)
Albumin: 4.3 g/dL (ref 3.5–5.0)
Anion gap: 10 (ref 5–15)
BUN: 17 mg/dL (ref 6–20)
CALCIUM: 9.4 mg/dL (ref 8.9–10.3)
CO2: 24 mmol/L (ref 22–32)
CREATININE: 0.91 mg/dL (ref 0.61–1.24)
Chloride: 106 mmol/L (ref 101–111)
Glucose, Bld: 139 mg/dL — ABNORMAL HIGH (ref 65–99)
Potassium: 4.3 mmol/L (ref 3.5–5.1)
Sodium: 140 mmol/L (ref 135–145)
Total Bilirubin: 1.4 mg/dL — ABNORMAL HIGH (ref 0.3–1.2)
Total Protein: 8.9 g/dL — ABNORMAL HIGH (ref 6.5–8.1)

## 2015-04-05 LAB — CBC WITH DIFFERENTIAL/PLATELET
Basophils Absolute: 0 10*3/uL (ref 0.0–0.1)
Basophils Relative: 0 %
EOS ABS: 0.3 10*3/uL (ref 0.0–0.7)
EOS PCT: 2 %
HCT: 50.2 % (ref 39.0–52.0)
Hemoglobin: 17.2 g/dL — ABNORMAL HIGH (ref 13.0–17.0)
LYMPHS ABS: 3.9 10*3/uL (ref 0.7–4.0)
LYMPHS PCT: 31 %
MCH: 31.4 pg (ref 26.0–34.0)
MCHC: 34.3 g/dL (ref 30.0–36.0)
MCV: 91.8 fL (ref 78.0–100.0)
MONO ABS: 0.8 10*3/uL (ref 0.1–1.0)
Monocytes Relative: 6 %
Neutro Abs: 7.7 10*3/uL (ref 1.7–7.7)
Neutrophils Relative %: 61 %
PLATELETS: 246 10*3/uL (ref 150–400)
RBC: 5.47 MIL/uL (ref 4.22–5.81)
RDW: 12.6 % (ref 11.5–15.5)
WBC: 12.7 10*3/uL — AB (ref 4.0–10.5)

## 2015-04-05 LAB — RAPID URINE DRUG SCREEN, HOSP PERFORMED
AMPHETAMINES: NOT DETECTED
Barbiturates: NOT DETECTED
Benzodiazepines: POSITIVE — AB
Cocaine: NOT DETECTED
OPIATES: NOT DETECTED
Tetrahydrocannabinol: NOT DETECTED

## 2015-04-05 LAB — ETHANOL

## 2015-04-05 SURGERY — EGD (ESOPHAGOGASTRODUODENOSCOPY)
Anesthesia: Moderate Sedation

## 2015-04-05 MED ORDER — BUTAMBEN-TETRACAINE-BENZOCAINE 2-2-14 % EX AERO
INHALATION_SPRAY | CUTANEOUS | Status: DC | PRN
  Administered 2015-04-05: 2 via TOPICAL

## 2015-04-05 MED ORDER — MEPERIDINE HCL 50 MG/ML IJ SOLN
INTRAMUSCULAR | Status: AC
  Filled 2015-04-05: qty 1

## 2015-04-05 MED ORDER — MEPERIDINE HCL 50 MG/ML IJ SOLN
INTRAMUSCULAR | Status: DC | PRN
  Administered 2015-04-05 (×3): 25 mg via INTRAVENOUS

## 2015-04-05 MED ORDER — MIDAZOLAM HCL 5 MG/5ML IJ SOLN
INTRAMUSCULAR | Status: AC
  Filled 2015-04-05: qty 10

## 2015-04-05 MED ORDER — SODIUM CHLORIDE 0.9% FLUSH
INTRAVENOUS | Status: AC
  Filled 2015-04-05: qty 10

## 2015-04-05 MED ORDER — PROMETHAZINE HCL 25 MG/ML IJ SOLN
INTRAMUSCULAR | Status: AC
  Filled 2015-04-05: qty 1

## 2015-04-05 MED ORDER — MIDAZOLAM HCL 5 MG/5ML IJ SOLN
INTRAMUSCULAR | Status: AC
  Filled 2015-04-05: qty 5

## 2015-04-05 MED ORDER — STERILE WATER FOR IRRIGATION IR SOLN
Status: DC | PRN
  Administered 2015-04-05: 2.5 mL

## 2015-04-05 MED ORDER — PROMETHAZINE HCL 25 MG/ML IJ SOLN
INTRAMUSCULAR | Status: DC | PRN
  Administered 2015-04-05: 12.5 mg via INTRAVENOUS

## 2015-04-05 MED ORDER — MIDAZOLAM HCL 5 MG/5ML IJ SOLN
INTRAMUSCULAR | Status: DC | PRN
  Administered 2015-04-05 (×3): 3 mg via INTRAVENOUS

## 2015-04-05 MED ORDER — BUTAMBEN-TETRACAINE-BENZOCAINE 2-2-14 % EX AERO
INHALATION_SPRAY | CUTANEOUS | Status: AC
  Filled 2015-04-05: qty 20

## 2015-04-05 NOTE — ED Notes (Signed)
Endoscopy staff to department.

## 2015-04-05 NOTE — ED Notes (Signed)
Spoke with [employer].  Provided update on patient status and radiology results.  Informed that they were going to close out patient case.

## 2015-04-05 NOTE — ED Notes (Signed)
Pt reporting he swallowed 5 of 6 AA batteries.  States that he did this because he wasn't receiving help with verification if his son was in a car accident or not. Pt showing no distress at this time.

## 2015-04-05 NOTE — Discharge Instructions (Signed)
Return to emergency department for any severe worsening of pain.  Swallowed Foreign Body, Adult A swallowed foreign body means that you swallow something and it gets stuck. It might be food or something else. The object may get stuck in the tube that connects your throat to your stomach (esophagus), or it may get stuck in another part of your belly (digestive tract). It is very important to tell your doctor what you have swallowed. Sometimes, the object will pass through your body on its own. Sometimes, the object will pass after you are given a medicine to relax your throat. The object may need to be taken out by a doctor if it is dangerous or if it will not pass through your body on its own. An object may need to be removed with surgery if:  It gets stuck in your throat.  You cannot swallow.  You cannot breathe well.  It is sharp.  It is harmful or poisonous (toxic), like batteries or drugs. HOME CARE  Eat what you normally eat if your doctor says that you can.  Keep checking your poop (stool) to see if the object has come out of your body.  Call your doctor if the object has not come out of your body after 3 days. If you had surgery (endoscopy) to remove the object:  Care for yourself after surgery as told by your doctor. Keep all follow-up visits as told by your doctor. This is important. SEEK MEDICAL CARE IF:  You still have problems after you have been treated.  The object has not come out of your body after 3 days. GET HELP RIGHT AWAY IF:  You have a fever.  You have pain in your chest or your belly.  You cough up blood.  You have blood in your poop or your throw up (vomit).   This information is not intended to replace advice given to you by your health care provider. Make sure you discuss any questions you have with your health care provider.   Document Released: 06/02/2010 Document Revised: 11/06/2014 Document Reviewed: 05/15/2014 Elsevier Interactive Patient  Education [employer]

## 2015-04-05 NOTE — Progress Notes (Signed)
Patient was seen by Dr. Karilyn Cota for removal of foreign body. Attempted EGD. Unsuccessful due to patient not being able to be sedated. Called in Anesthesia to assist with sedation. Anesthesia declined to sedate patient due to history of complications (per patient) from anesthesia. (History of difficult intubation)

## 2015-04-05 NOTE — H&P (Signed)
Bryan Gallegos is an 40 y.o.EDEM TIEGSComplaint: Patient is here for EGD and foreign body removal. HPI: Patient is 40 year old Caucasian male who was brought to emergency room just after the dentate because he swallowed battery. Patient is in prison. He had been upset not getting and use his son's accident. Evaluation in emergency room revealed foreign body to be in the stomach. Patient states that he chewed and before he swallowed. 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 He has chronic heartburn. He's been on PPI for several years. There is no history of dysphagia hematemesis or melena.  Past Medical History  Diagnosis Date  . Heart valve disorder     s/p echocardiogram  . Hypertension   . Asthma   . Diabetes mellitus   . Hyperlipidemia   . Anaphylactic reaction   . Bipolar disorder (HCC)   . Adult ADHD   . Morbidly obese (HCC)   . Transient cerebral ischemia     Unknown  . OSA (obstructive sleep apnea)   . Anxiety     Past Surgical History  Procedure Laterality Date  . Carpal tunnel release    . Nose surgery    . Wisdom tooth extraction    . Tooth extraction      Family History  Problem Relation Age of Onset  . CAD Mother     Living  . Diabetes Mellitus II Mother   . Stroke Mother   . Hypertension Mother   . Congestive Heart Failure Mother   . Kidney disease Mother   . Fibromyalgia Mother   . Thyroid disease Mother   . Hyperlipidemia Mother   . Liver disease Mother   . Alcoholism Father 55    Deceased  . Arthritis Maternal Grandmother   . Congestive Heart Failure Maternal Grandmother   . Hypertension Maternal Grandmother   . Lung cancer Maternal Grandfather   . Colon cancer Maternal Aunt   . Stomach cancer Maternal Aunt   . Heart disease Other     Paternal & Maternal  . Hypertension Other     Paternal & Maternal  . Hypertension Brother     x3  . Hypertension Sister     #1  . Bipolar disorder Sister     #1  . ADD / ADHD Son     x3  . Bipolar disorder Son      x3  . Asperger's syndrome Son    Social History:  reports that he has been smoking Cigarettes.  He has a 23 pack-year smoking history. He has never used smokeless tobacco. He reports that he drinks alcohol. He reports that he does not use illicit drugs.  Allergies:  Allergies  Allergen Reactions  . Atenolol Anaphylaxis  . Other Anaphylaxis    "Prednisone like drugs"  . Tylenol [Acetaminophen] Other (See Comments)    GI Bleed     (Not in a hospital admission)  Results for orders placed or performed during the hospital encounter of 04/05/15 (from the past 48 hour(s))  Urine rapid drug screen (hosp performed)not at [facility]     Status: Abnormal   Collection Time: 04/05/15  1:46 AM  Result Value Ref Range   Opiates NONE DETECTED NONE DETECTED   Cocaine NONE DETECTED NONE DETECTED   Benzodiazepines POSITIVE (A) NONE DETECTED   Amphetamines NONE DETECTED NONE DETECTED   Tetrahydrocannabinol NONE DETECTED NONE DETECTED   Barbiturates NONE DETECTED NONE DETECTED    Comment:        DRUG  SCREEN FOR MEDICAL PURPOSES ONLY.  IF CONFIRMATION IS NEEDED FOR ANY PURPOSE, NOTIFY LAB WITHIN 5 DAYS.        LOWEST DETECTABLE LIMITS FOR URINE DRUG SCREEN Drug Class       Cutoff (ng/mL) Amphetamine      1000 Barbiturate      200 Benzodiazepine   200 Tricyclics       300 Opiates          300 Cocaine          300 THC              50   CBC with Diff     Status: Abnormal   Collection Time: 04/05/15  2:25 AM  Result Value Ref Range   WBC 12.7 (H) 4.0 - 10.5 K/uL   RBC 5.47 4.22 - 5.81 MIL/uL   Hemoglobin 17.2 (H) 13.0 - 17.0 g/dL   HCT 16.1 09.6 - 04.5 %   MCV 91.8 78.0 - 100.0 fL   MCH 31.4 26.0 - 34.0 pg   MCHC 34.3 30.0 - 36.0 g/dL   RDW 40.9 81.1 - 91.4 %   Platelets 246 150 - 400 K/uL   Neutrophils Relative % 61 %   Neutro Abs 7.7 1.7 - 7.7 K/uL   Lymphocytes Relative 31 %   Lymphs Abs 3.9 0.7 - 4.0 K/uL   Monocytes Relative 6 %   Monocytes Absolute 0.8 0.1 - 1.0 K/uL    Eosinophils Relative 2 %   Eosinophils Absolute 0.3 0.0 - 0.7 K/uL   Basophils Relative 0 %   Basophils Absolute 0.0 0.0 - 0.1 K/uL   Dg Chest 1 View  04/05/2015  CLINICAL DATA:  Patient swallowed multiple batteries. EXAM: CHEST 1 VIEW COMPARISON:  Chest radiograph 04/01/2015 FINDINGS: Normal cardiac and mediastinal contours. No consolidative pulmonary opacities. No pleural effusion or pneumothorax. Regional skeleton is unremarkable. IMPRESSION: No acute cardiopulmonary process. Electronically Signed   By: Annia Belt M.D.   On: 04/05/2015 00:00   Dg Abd 1 View  04/05/2015  CLINICAL DATA:  Patient swallowed multiple batteries. EXAM: ABDOMEN - 1 VIEW COMPARISON:  CT abdomen pelvis 03/30/2015 FINDINGS: Large radiodensity within the central abdomen. Nonobstructed bowel gas pattern. Lung bases are clear. No free fluid or free intraperitoneal air. Regional skeleton is unremarkable. IMPRESSION: Radiodensity within the central abdomen likely corresponds with reported history of swallowed battery. Nonobstructed bowel gas pattern. Electronically Signed   By: Annia Belt M.D.   On: 04/05/2015 00:02    ROS  Blood pressure 156/97, pulse 84, temperature 97.7 F (36.5 C), temperature source Oral, resp. rate 20, height  (1.88 m), weight 327 lb (148.326 kg), SpO2 100 %. Physical Exam  Constitutional:  Well-developed obese Caucasian male in NAD.  HENT:  Mouth/Throat: Oropharynx is clear and moist.  Eyes: Conjunctivae are normal. No scleral icterus.  Neck: No thyromegaly present.  Cardiovascular: Normal rate, regular rhythm and normal heart sounds.   No murmur heard. Respiratory: Effort normal and breath sounds normal.  GI:  Abdomen is obese but soft with mild tenderness at RLQ. No organomegaly or masses.  Musculoskeletal: He exhibits no edema.  Lymphadenopathy:    He has no cervical adenopathy.  Neurological: He is alert.  Skin: Skin is warm and dry.     Assessment/Plan Foreign body  stomach. EGD with foreign body removal.  Skie Vitrano U, MD 04/05/2015, 3:09 AM

## 2015-04-05 NOTE — Op Note (Signed)
EGD PROCEDURE REPORT  PATIENT:  Bryan Gallegos  MR#:  [id number] Birthdate:  12-Dec-1975, 40 y.o., male Endoscopist:  Dr. Malissa Hippo, MD Referred By:  Dr. Jaci Carrel, MD  Procedure Date: 04/05/2015  Procedure:   EGD(incomplete)  Indications:  Patient is 40 year old Caucasian male with multiple medical problems who swallowed AA battery. Acute abdominal series reveals battery to be in the stomach. Patient has chronic GERD.            Informed Consent:  The risks, benefits, alternatives & imponderables which include, but are not limited to, bleeding, infection, perforation, drug reaction and potential missed lesion have been reviewed.  The potential for biopsy, lesion removal, esophageal dilation, etc. have also been discussed.  Questions have been answered.  All parties agreeable.  Please see history & physical in medical record for more information.  Medications:  Demerol 75 mg IV Versed 9 mg IV Promethazine 12.5 mg IV and diluted form. Cetacaine spray topically for oropharyngeal anesthesia  First dose administered at 0332 Last dose administered at 0336  Description of procedure:  The endoscope was introduced through the mouth and hypopharynx. As I attempted to advance the scope into esophagus patient began to heave and retch. Therefore procedure abandoned and help sought from anesthesia team for the procedure to be performed under MAC.  Findings:  Hypopharynx: Normal. Esophagus: Not examined Stomach:  Not examined Duodenum:  Not examined.  Therapeutic/Diagnostic Maneuvers Performed:  None  Complications:  None  EBL: None  Impression: Esophagogastroduodenoscopy could not be completed for lack of cooperation on the part of patient. Patient could not be sedated. Ms. Sherlie Ban, CRNA recommends patient be transferred to tertiary center as patient is deemed to be high risk for monitored anesthesia care/intubation.  Recommendations:  Patient transferred back to  emergency room. Dr. Blinda Leatherwood to arrange transfer to [facility].  REHMAN,NAJEEB U  04/05/2015  4:46 AM  CC: Dr. No primary care provider on file. & Dr. No ref. provider found

## 2015-04-05 NOTE — ED Notes (Signed)
Pt to endoscopy.

## 2015-04-05 NOTE — ED Notes (Signed)
Pt returned to department following attempted endoscopy.  Pt stable. Alert and cooperative.

## 2015-04-05 NOTE — ED Provider Notes (Signed)
CSN: [id number]     Arrival date & time 04/04/15  2321 History   First MD Initiated Contact with Patient 04/05/15 0016     Chief Complaint  Patient presents with  . Swallowed Batteries      (Consider location/radiation/quality/duration/timing/severity/associated sxs/prior Treatment) HPI Comments: Patient presents after swallowing AA batteries. Patient reports that he intentionally swallowed batteries tonight in an attempt to hurt himself. He reports that he is a prisoner and had heard that his son was in a car accident and died, was unable to get any confirmation. He swallowed the batteries because of this.  He reports that he attempted to swallow 2 batteries. He reports puncturing the batteries before swallowing. He did complete a swallow 1 battery, 1, caught in the back of his throat and he coughed or vomited it back up. He is experiencing a burning sensation in the center of his upper abdomen currently. No difficulty breathing.   Past Medical History  Diagnosis Date  . Heart valve disorder     s/p echocardiogram  . Hypertension   . Asthma   . Diabetes mellitus   . Hyperlipidemia   . Anaphylactic reaction   . Bipolar disorder (HCC)   . Adult ADHD   . Morbidly obese (HCC)   . Transient cerebral ischemia     Unknown  . OSA (obstructive sleep apnea)   . Anxiety    Past Surgical History  Procedure Laterality Date  . Carpal tunnel release    . Nose surgery    . Wisdom tooth extraction    . Tooth extraction     Family History  Problem Relation Age of Onset  . CAD Mother     Living  . Diabetes Mellitus II Mother   . Stroke Mother   . Hypertension Mother   . Congestive Heart Failure Mother   . Kidney disease Mother   . Fibromyalgia Mother   . Thyroid disease Mother   . Hyperlipidemia Mother   . Liver disease Mother   . Alcoholism Father 62    Deceased  . Arthritis Maternal Grandmother   . Congestive Heart Failure Maternal Grandmother   . Hypertension Maternal  Grandmother   . Lung cancer Maternal Grandfather   . Colon cancer Maternal Aunt   . Stomach cancer Maternal Aunt   . Heart disease Other     Paternal & Maternal  . Hypertension Other     Paternal & Maternal  . Hypertension Brother     x3  . Hypertension Sister     #1  . Bipolar disorder Sister     #1  . ADD / ADHD Son     x3  . Bipolar disorder Son     x3  . Asperger's syndrome Son    Social History  Substance Use Topics  . Smoking status: Current Every Day Smoker -- 1.00 packs/day for 23 years    Types: Cigarettes  . Smokeless tobacco: Never Used  . Alcohol Use: 0.0 oz/week    0 Standard drinks or equivalent per week     Comment: rare    Review of Systems  Gastrointestinal: Positive for abdominal pain.  All other systems reviewed and are negative.     Allergies  Atenolol; Other; and Tylenol  Home Medications   Prior to Admission medications   Medication Sig Start Date End Date Taking? Authorizing Provider  albuterol (PROVENTIL HFA;VENTOLIN HFA) 108 (90 BASE) MCG/ACT inhaler Inhale 2 puffs into the lungs every 6 (six) hours as needed  for wheezing or shortness of breath. 03/06/14   Waldon Merl, PA-C  ALPRAZolam Prudy Feeler) 1 MG tablet Take 1 mg by mouth 4 (four) times daily.     Historical Provider, MD  amphetamine-dextroamphetamine (ADDERALL) 30 MG tablet Take 30 mg by mouth 2 (two) times daily.    Historical Provider, MD  aspirin 325 MG tablet Take 1 tablet (325 mg total) by mouth daily. 09/04/13   [name], MD  cholecalciferol (VITAMIN D) 1000 UNITS tablet Take 1,000 Units by mouth daily.    Historical Provider, MD  citalopram (CELEXA) 10 MG tablet Take 1 tablet (10 mg total) by mouth daily. Patient not taking: Reported on 03/30/2015 03/06/14   Waldon Merl, PA-C  cloNIDine (CATAPRES) 0.2 MG tablet Take 1 tablet (0.2 mg total) by mouth at bedtime. 03/06/14   Waldon Merl, PA-C  Fluticasone-Salmeterol (ADVAIR) 250-50 MCG/DOSE AEPB Inhale 1 puff into the  lungs every 12 (twelve) hours. 03/06/14   Waldon Merl, PA-C  furosemide (LASIX) 20 MG tablet Take 1 tablet (20 mg total) by mouth daily. Patient not taking: Reported on 05/26/2014 03/06/14   Waldon Merl, PA-C  gabapentin (NEURONTIN) 800 MG tablet Take 2,400 mg by mouth 2 (two) times daily.    Historical Provider, MD  lisinopril (PRINIVIL,ZESTRIL) 40 MG tablet Take 40 mg by mouth 2 (two) times daily. 05/17/14   Historical Provider, MD  metFORMIN (GLUCOPHAGE) 500 MG tablet Take 1 tablet (500 mg total) by mouth 2 (two) times daily with a meal. Patient not taking: Reported on 05/26/2014 03/06/14   Waldon Merl, PA-C  naproxen (NAPROSYN) 500 MG tablet Take 1 tablet (500 mg total) by mouth 2 (two) times daily. 03/30/15   Vanetta Mulders, MD  omeprazole (PRILOSEC) 20 MG capsule Take 1 capsule (20 mg total) by mouth daily. 03/06/14   Waldon Merl, PA-C  oxycodone (ROXICODONE) 30 MG immediate release tablet Take 30 mg by mouth every 4 (four) hours as needed. For pain 08/12/14   Historical Provider, MD  pravastatin (PRAVACHOL) 40 MG tablet Take 1 tablet (40 mg total) by mouth at bedtime. Patient not taking: Reported on 05/26/2014 03/06/14   Waldon Merl, PA-C  PRESCRIPTION MEDICATION Take 1 tablet by mouth daily. Cholesterol Medication.    Historical Provider, MD  sitaGLIPtin-metformin (JANUMET) 50-500 MG per tablet Take 1 tablet by mouth 2 (two) times daily with a meal.    Historical Provider, MD  zolpidem (AMBIEN) 10 MG tablet Take 10 mg by mouth at bedtime as needed for sleep.    Historical Provider, MD   BP 156/97 mmHg  Pulse 84  Temp(Src) 97.7 F (36.5 C) (Oral)  Resp 20  Ht  (1.88 m)  Wt 327 lb (148.326 kg)  BMI 41.97 kg/m2  SpO2 100% Physical Exam  Constitutional: He is oriented to person, place, and time. He appears well-developed and well-nourished. No distress.  HENT:  Head: Normocephalic and atraumatic.  Right Ear: Hearing normal.  Left Ear: Hearing normal.  Nose: Nose  normal.  Mouth/Throat: Oropharynx is clear and moist and mucous membranes are normal.  Eyes: Conjunctivae and EOM are normal. Pupils are equal, round, and reactive to light.  Neck: Normal range of motion. Neck supple.  Cardiovascular: Regular rhythm, S1 normal and S2 normal.  Exam reveals no gallop and no friction rub.   No murmur heard. Pulmonary/Chest: Effort normal and breath sounds normal. No respiratory distress. He exhibits no tenderness.  Abdominal: Soft. Normal appearance and bowel sounds are  normal. There is no hepatosplenomegaly. There is no tenderness. There is no rebound, no guarding, no tenderness at McBurney's point and negative Murphy's sign. No hernia.  Musculoskeletal: Normal range of motion.  Neurological: He is alert and oriented to person, place, and time. He has normal strength. No cranial nerve deficit or sensory deficit. Coordination normal. GCS eye subscore is 4. GCS verbal subscore is 5. GCS motor subscore is 6.  Skin: Skin is warm, dry and intact. No rash noted. No cyanosis.  Psychiatric: He has a normal mood and affect. His speech is normal and behavior is normal. Thought content normal.  Nursing note and vitals reviewed.   ED Course  Procedures (including critical care time) Labs Review Labs Reviewed - No data to display  Imaging Review Dg Chest 1 View  04/05/2015  CLINICAL DATA:  Patient swallowed multiple batteries. EXAM: CHEST 1 VIEW COMPARISON:  Chest radiograph 04/01/2015 FINDINGS: Normal cardiac and mediastinal contours. No consolidative pulmonary opacities. No pleural effusion or pneumothorax. Regional skeleton is unremarkable. IMPRESSION: No acute cardiopulmonary process. Electronically Signed   By: Annia Belt M.D.   On: 04/05/2015 00:00   Dg Abd 1 View  04/05/2015  CLINICAL DATA:  Patient swallowed multiple batteries. EXAM: ABDOMEN - 1 VIEW COMPARISON:  CT abdomen pelvis 03/30/2015 FINDINGS: Large radiodensity within the central abdomen. Nonobstructed  bowel gas pattern. Lung bases are clear. No free fluid or free intraperitoneal air. Regional skeleton is unremarkable. IMPRESSION: Radiodensity within the central abdomen likely corresponds with reported history of swallowed battery. Nonobstructed bowel gas pattern. Electronically Signed   By: Annia Belt M.D.   On: 04/05/2015 00:02   I have personally reviewed and evaluated these images and lab results as part of my medical decision-making.   EKG Interpretation None      MDM   Final diagnoses:  Swallowed foreign body    Presents to the ER after intentionally swallowing a AA battery. Patient informs me that he ruptured the battery before swallowing, causing concern for leakage of battery acid. Discussed with gastroenterology, Dr. Karilyn Cota. He will come in and take the patient to endoscopy for removal. X-ray shows a single AA battery in what appears to be the stomach.  Case was discussed with Dr. Karilyn Cota, on call for gastroenterology. He did take the patient over to endoscopy but was unable to perform EGD. Patient proved difficult to sedate. This is likely secondary to drug and alcohol abuse history. Patient was sent back to the ER. At this point I discussed the case with Dr. Loreta Ave, gastroenterology at [facility]. She did not feel that she could help anymore and recommended discussing the case with on-call gastroenterology at [facility].  Case was therefore discussed with Dr. Grayling Congress, on call for gastroenterology at [facility]. He did not feel that the patient had an emergent endoscopic condition. He felt that the patient did not require retrieval of the battery. Patient reportedly has a history of difficult intubation and possible complications of general anesthesia. He felt that these risks are far greater than leaving the battery in place. He felt that this was the case even if the battery was punctured. I feel that the battery is unlikely to be punctured. Patient initially told nursing staff that he  swallowed 6 batteries. When he was told that that would not likely be a problem in that he wouldn't need anything specifically done for a battery, he then changed his story to state that he tore the tops off the batteries. X-ray shows a single  intact battery in his stomach.  Dr. Grayling Congress recommends serial x-rays and following clinically. Patient has been in the ER here for more than 6 hours and continues to do well. Will discharge with outpatient follow-up at the present.  Gilda Crease, MD 04/05/15 [phone]

## 2015-04-07 LAB — CBG MONITORING, ED: GLUCOSE-CAPILLARY: 150 mg/dL — AB (ref 65–99)

## 2015-04-08 ENCOUNTER — Observation Stay (HOSPITAL_COMMUNITY): Payer: Medicare Other

## 2015-04-08 ENCOUNTER — Emergency Department (HOSPITAL_BASED_OUTPATIENT_CLINIC_OR_DEPARTMENT_OTHER): Payer: Medicare Other

## 2015-04-08 ENCOUNTER — Encounter (HOSPITAL_BASED_OUTPATIENT_CLINIC_OR_DEPARTMENT_OTHER): Payer: Self-pay | Admitting: Emergency Medicine

## 2015-04-08 ENCOUNTER — Observation Stay (HOSPITAL_BASED_OUTPATIENT_CLINIC_OR_DEPARTMENT_OTHER)
Admission: EM | Admit: 2015-04-08 | Discharge: 2015-04-09 | Disposition: A | Discharge status: Home / Self Care | Payer: Medicare Other | Attending: Emergency Medicine | Admitting: Emergency Medicine

## 2015-04-08 DIAGNOSIS — I10 Essential (primary) hypertension: Secondary | ICD-10-CM | POA: Insufficient documentation

## 2015-04-08 DIAGNOSIS — Z79899 Other long term (current) drug therapy: Secondary | ICD-10-CM | POA: Insufficient documentation

## 2015-04-08 DIAGNOSIS — R531 Weakness: Principal | ICD-10-CM | POA: Diagnosis present

## 2015-04-08 DIAGNOSIS — Z8673 Personal history of transient ischemic attack (TIA), and cerebral infarction without residual deficits: Secondary | ICD-10-CM | POA: Insufficient documentation

## 2015-04-08 DIAGNOSIS — R29810 Facial weakness: Secondary | ICD-10-CM | POA: Insufficient documentation

## 2015-04-08 DIAGNOSIS — J45909 Unspecified asthma, uncomplicated: Secondary | ICD-10-CM | POA: Insufficient documentation

## 2015-04-08 DIAGNOSIS — F909 Attention-deficit hyperactivity disorder, unspecified type: Secondary | ICD-10-CM | POA: Insufficient documentation

## 2015-04-08 DIAGNOSIS — Z7984 Long term (current) use of oral hypoglycemic drugs: Secondary | ICD-10-CM | POA: Insufficient documentation

## 2015-04-08 DIAGNOSIS — R0682 Tachypnea, not elsewhere classified: Secondary | ICD-10-CM | POA: Insufficient documentation

## 2015-04-08 DIAGNOSIS — R000 Tachycardia, unspecified: Secondary | ICD-10-CM | POA: Insufficient documentation

## 2015-04-08 DIAGNOSIS — F419 Anxiety disorder, unspecified: Secondary | ICD-10-CM | POA: Insufficient documentation

## 2015-04-08 DIAGNOSIS — E785 Hyperlipidemia, unspecified: Secondary | ICD-10-CM | POA: Insufficient documentation

## 2015-04-08 DIAGNOSIS — Z791 Long term (current) use of non-steroidal anti-inflammatories (NSAID): Secondary | ICD-10-CM | POA: Insufficient documentation

## 2015-04-08 DIAGNOSIS — G4733 Obstructive sleep apnea (adult) (pediatric): Secondary | ICD-10-CM | POA: Insufficient documentation

## 2015-04-08 DIAGNOSIS — E119 Type 2 diabetes mellitus without complications: Secondary | ICD-10-CM | POA: Insufficient documentation

## 2015-04-08 DIAGNOSIS — F17210 Nicotine dependence, cigarettes, uncomplicated: Secondary | ICD-10-CM | POA: Insufficient documentation

## 2015-04-08 DIAGNOSIS — Z79891 Long term (current) use of opiate analgesic: Secondary | ICD-10-CM | POA: Insufficient documentation

## 2015-04-08 DIAGNOSIS — R4182 Altered mental status, unspecified: Secondary | ICD-10-CM | POA: Insufficient documentation

## 2015-04-08 DIAGNOSIS — Z7982 Long term (current) use of aspirin: Secondary | ICD-10-CM | POA: Insufficient documentation

## 2015-04-08 DIAGNOSIS — I38 Endocarditis, valve unspecified: Secondary | ICD-10-CM | POA: Insufficient documentation

## 2015-04-08 LAB — PROTIME-INR
INR: 1.07 (ref 0.00–1.49)
Prothrombin Time: 14.1 seconds (ref 11.6–15.2)

## 2015-04-08 LAB — DIFFERENTIAL
BASOS ABS: 0 10*3/uL (ref 0.0–0.1)
BASOS PCT: 0 %
EOS ABS: 0.2 10*3/uL (ref 0.0–0.7)
Eosinophils Relative: 2 %
Lymphocytes Relative: 27 %
Lymphs Abs: 2.9 10*3/uL (ref 0.7–4.0)
Monocytes Absolute: 1.1 10*3/uL — ABNORMAL HIGH (ref 0.1–1.0)
Monocytes Relative: 11 %
NEUTROS PCT: 60 %
Neutro Abs: 6.5 10*3/uL (ref 1.7–7.7)

## 2015-04-08 LAB — COMPREHENSIVE METABOLIC PANEL
ALBUMIN: 4.1 g/dL (ref 3.5–5.0)
ALT: 79 U/L — ABNORMAL HIGH (ref 17–63)
AST: 58 U/L — AB (ref 15–41)
Alkaline Phosphatase: 63 U/L (ref 38–126)
Anion gap: 9 (ref 5–15)
BUN: 14 mg/dL (ref 6–20)
CHLORIDE: 106 mmol/L (ref 101–111)
CO2: 23 mmol/L (ref 22–32)
Calcium: 8.8 mg/dL — ABNORMAL LOW (ref 8.9–10.3)
Creatinine, Ser: 0.97 mg/dL (ref 0.61–1.24)
GFR calc Af Amer: >60 mL/min (ref >60)
GFR calc non Af Amer: >60 mL/min (ref >60)
GLUCOSE: 203 mg/dL — AB (ref 65–99)
POTASSIUM: 3.9 mmol/L (ref 3.5–5.1)
SODIUM: 138 mmol/L (ref 135–145)
Total Bilirubin: 0.9 mg/dL (ref 0.3–1.2)
Total Protein: 7.7 g/dL (ref 6.5–8.1)

## 2015-04-08 LAB — CBC
HCT: 45.6 % (ref 39.0–52.0)
HEMOGLOBIN: 15.4 g/dL (ref 13.0–17.0)
MCH: 30.9 pg (ref 26.0–34.0)
MCHC: 33.8 g/dL (ref 30.0–36.0)
MCV: 91.4 fL (ref 78.0–100.0)
Platelets: 226 10*3/uL (ref 150–400)
RBC: 4.99 MIL/uL (ref 4.22–5.81)
RDW: 12.3 % (ref 11.5–15.5)
WBC: 10.8 10*3/uL — ABNORMAL HIGH (ref 4.0–10.5)

## 2015-04-08 LAB — APTT: APTT: 27 s (ref 24–37)

## 2015-04-08 LAB — CBG MONITORING, ED: GLUCOSE-CAPILLARY: 193 mg/dL — AB (ref 65–99)

## 2015-04-08 LAB — ETHANOL

## 2015-04-08 MED ORDER — ACETAMINOPHEN 325 MG PO TABS
650.0000 mg | ORAL_TABLET | Freq: Once | ORAL | Status: DC
Start: 2015-04-08 — End: 2015-04-09
  Filled 2015-04-08 (×2): qty 2

## 2015-04-08 MED ORDER — NALOXONE HCL 0.4 MG/ML IJ SOLN
INTRAMUSCULAR | Status: AC
  Administered 2015-04-08: 2 mg
  Filled 2015-04-08: qty 2

## 2015-04-08 MED ORDER — AMMONIA AROMATIC IN INHA
RESPIRATORY_TRACT | Status: AC
  Administered 2015-04-08: 1 mL via RESPIRATORY_TRACT
  Filled 2015-04-08: qty 10

## 2015-04-08 NOTE — ED Notes (Signed)
Report to Press photographer at [facility].

## 2015-04-08 NOTE — ED Notes (Addendum)
Pt brought to ED by son-pt is in w/c-unresponsive with head slumped over-no responds to verbal and painful stimuli-pt is breathing-taken to tx room 1 upon arrival-son states that pt was [facility]c from jail approx 10am-son was called by brother approx 1pm and stated pt was talking with slurred speech and that pt BS was elevated-son arrived to find pt able to stand but "wobbly"-both sons assisted pt to car-security officer and son state pt was able to stand and sit in w/c upon arrival

## 2015-04-08 NOTE — ED Notes (Signed)
Pt brought to ed by family, pt unresponsive at time of arrival, pt given .4 narcan given
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
## 2015-04-08 NOTE — ED Notes (Signed)
From 1557 - to 1610: Patient from triage slumped over in chair with snoring respirations. Patient given sternal rub and patient woke up long enough to stand and pivot to bed. Patient is laid down in bed and placed on monitor. Noted right eye does not open as well and as fast as left eye lid. Patient sternal rubbed again to ask questions, patient with no response. Pupils with sluggish response. MD at bedside for assessment, Narcan verbally ordered - attempted IV - 22 to right wrist obtained and narcan given at 1605 - the patient wake up about 1 minute later and has been responsive since. Patient stroke work up was done post narcan.  Of note: Patient adamantly denies any drug usage or any medications taken, patient has multiple needle sticks sights and bruising. Patient has multiple subcutaneous horizontal healing lacerations to his left wrist. Patient is in poor ADL condition with presence of Body odor.  Patient also reports that he is "stressed out because all just getting out of jail, worried about my wife and was just dx. With Hep C" Also reports at 1610 that his son gave him a xanax for his anxiety and stress. Patient is tearful and upset.

## 2015-04-08 NOTE — ED Notes (Signed)
Pt states that his son got him a xanax today due to stress of current living situation

## 2015-04-08 NOTE — ED Provider Notes (Signed)
Pt transferred from [facility] after evaluation by Dr. Dalene Seltzer. Per her report, the patient had presented unresponsive and was given Narcan after which she became more responsive. He then complained of left-sided weakness which he stated had been going on for 2 days. On exam, he is comfortable w/ stable VS. Complaining of chronic low back pain. Due to bedside nurse's concern for swallowing, I have ordered IM toradol. Per Dr. Burr Medico instruction, I have ordered MRI brain. She has discussed w/ neurology and given patient's hx of psychosomatic complaints, they have recommended discharge if work up negative. Dispo pending work up results.  Laurence Spates, MD 04/09/15 [phone]

## 2015-04-08 NOTE — ED Notes (Signed)
Patient transported to MRI 

## 2015-04-09 ENCOUNTER — Inpatient Hospital Stay (HOSPITAL_COMMUNITY)
Admission: AD | Admit: 2015-04-09 | Discharge: 2015-04-10 | DRG: 885 | Disposition: A | Discharge status: Other Acute Inpatient Hospital | Payer: No Typology Code available for payment source | Source: Intra-hospital | Attending: Psychiatry | Admitting: Psychiatry

## 2015-04-09 ENCOUNTER — Emergency Department (HOSPITAL_COMMUNITY)
Admission: EM | Admit: 2015-04-09 | Discharge: 2015-04-09 | Disposition: A | Discharge status: Other Acute Inpatient Hospital | Payer: No Typology Code available for payment source | Attending: Emergency Medicine | Admitting: Emergency Medicine

## 2015-04-09 ENCOUNTER — Encounter (HOSPITAL_COMMUNITY): Payer: Self-pay | Admitting: *Deleted

## 2015-04-09 ENCOUNTER — Emergency Department (HOSPITAL_COMMUNITY): Payer: Self-pay

## 2015-04-09 DIAGNOSIS — F319 Bipolar disorder, unspecified: Secondary | ICD-10-CM | POA: Diagnosis present

## 2015-04-09 DIAGNOSIS — F17210 Nicotine dependence, cigarettes, uncomplicated: Secondary | ICD-10-CM | POA: Insufficient documentation

## 2015-04-09 DIAGNOSIS — F3163 Bipolar disorder, current episode mixed, severe, without psychotic features: Principal | ICD-10-CM | POA: Diagnosis present

## 2015-04-09 DIAGNOSIS — X789XXA Intentional self-harm by unspecified sharp object, initial encounter: Secondary | ICD-10-CM | POA: Insufficient documentation

## 2015-04-09 DIAGNOSIS — Z7982 Long term (current) use of aspirin: Secondary | ICD-10-CM | POA: Insufficient documentation

## 2015-04-09 DIAGNOSIS — Z79899 Other long term (current) drug therapy: Secondary | ICD-10-CM | POA: Insufficient documentation

## 2015-04-09 DIAGNOSIS — Z915 Personal history of self-harm: Secondary | ICD-10-CM

## 2015-04-09 DIAGNOSIS — Z791 Long term (current) use of non-steroidal anti-inflammatories (NSAID): Secondary | ICD-10-CM | POA: Insufficient documentation

## 2015-04-09 DIAGNOSIS — F1120 Opioid dependence, uncomplicated: Secondary | ICD-10-CM | POA: Diagnosis present

## 2015-04-09 DIAGNOSIS — Z9989 Dependence on other enabling machines and devices: Secondary | ICD-10-CM

## 2015-04-09 DIAGNOSIS — Y9289 Other specified places as the place of occurrence of the external cause: Secondary | ICD-10-CM | POA: Insufficient documentation

## 2015-04-09 DIAGNOSIS — I10 Essential (primary) hypertension: Secondary | ICD-10-CM | POA: Insufficient documentation

## 2015-04-09 DIAGNOSIS — Y998 Other external cause status: Secondary | ICD-10-CM | POA: Insufficient documentation

## 2015-04-09 DIAGNOSIS — J45909 Unspecified asthma, uncomplicated: Secondary | ICD-10-CM | POA: Insufficient documentation

## 2015-04-09 DIAGNOSIS — R45851 Suicidal ideations: Secondary | ICD-10-CM

## 2015-04-09 DIAGNOSIS — F909 Attention-deficit hyperactivity disorder, unspecified type: Secondary | ICD-10-CM | POA: Insufficient documentation

## 2015-04-09 DIAGNOSIS — R45850 Homicidal ideations: Secondary | ICD-10-CM | POA: Diagnosis not present

## 2015-04-09 DIAGNOSIS — K2100 Gastro-esophageal reflux disease with esophagitis, without bleeding: Secondary | ICD-10-CM

## 2015-04-09 DIAGNOSIS — G4733 Obstructive sleep apnea (adult) (pediatric): Secondary | ICD-10-CM | POA: Diagnosis present

## 2015-04-09 DIAGNOSIS — E119 Type 2 diabetes mellitus without complications: Secondary | ICD-10-CM | POA: Insufficient documentation

## 2015-04-09 DIAGNOSIS — Z8673 Personal history of transient ischemic attack (TIA), and cerebral infarction without residual deficits: Secondary | ICD-10-CM | POA: Insufficient documentation

## 2015-04-09 DIAGNOSIS — F332 Major depressive disorder, recurrent severe without psychotic features: Secondary | ICD-10-CM | POA: Diagnosis present

## 2015-04-09 DIAGNOSIS — F3162 Bipolar disorder, current episode mixed, moderate: Secondary | ICD-10-CM | POA: Diagnosis present

## 2015-04-09 DIAGNOSIS — J4530 Mild persistent asthma, uncomplicated: Secondary | ICD-10-CM

## 2015-04-09 DIAGNOSIS — S60812A Abrasion of left wrist, initial encounter: Secondary | ICD-10-CM | POA: Insufficient documentation

## 2015-04-09 DIAGNOSIS — Y9389 Activity, other specified: Secondary | ICD-10-CM | POA: Insufficient documentation

## 2015-04-09 DIAGNOSIS — Z8669 Personal history of other diseases of the nervous system and sense organs: Secondary | ICD-10-CM | POA: Insufficient documentation

## 2015-04-09 DIAGNOSIS — E785 Hyperlipidemia, unspecified: Secondary | ICD-10-CM | POA: Insufficient documentation

## 2015-04-09 DIAGNOSIS — Z590 Homelessness: Secondary | ICD-10-CM | POA: Diagnosis not present

## 2015-04-09 DIAGNOSIS — F1310 Sedative, hypnotic or anxiolytic abuse, uncomplicated: Secondary | ICD-10-CM | POA: Diagnosis present

## 2015-04-09 DIAGNOSIS — Z653 Problems related to other legal circumstances: Secondary | ICD-10-CM | POA: Diagnosis not present

## 2015-04-09 DIAGNOSIS — Z7984 Long term (current) use of oral hypoglycemic drugs: Secondary | ICD-10-CM | POA: Insufficient documentation

## 2015-04-09 DIAGNOSIS — R4589 Other symptoms and signs involving emotional state: Secondary | ICD-10-CM | POA: Insufficient documentation

## 2015-04-09 DIAGNOSIS — F1390 Sedative, hypnotic, or anxiolytic use, unspecified, uncomplicated: Secondary | ICD-10-CM | POA: Diagnosis not present

## 2015-04-09 DIAGNOSIS — F419 Anxiety disorder, unspecified: Secondary | ICD-10-CM | POA: Insufficient documentation

## 2015-04-09 DIAGNOSIS — R4689 Other symptoms and signs involving appearance and behavior: Secondary | ICD-10-CM

## 2015-04-09 LAB — CBC
HCT: 45.6 % (ref 39.0–52.0)
HEMOGLOBIN: 15.3 g/dL (ref 13.0–17.0)
MCH: 31.1 pg (ref 26.0–34.0)
MCHC: 33.6 g/dL (ref 30.0–36.0)
MCV: 92.7 fL (ref 78.0–100.0)
PLATELETS: 215 10*3/uL (ref 150–400)
RBC: 4.92 MIL/uL (ref 4.22–5.81)
RDW: 12.6 % (ref 11.5–15.5)
WBC: 9.2 10*3/uL (ref 4.0–10.5)

## 2015-04-09 LAB — RAPID URINE DRUG SCREEN, HOSP PERFORMED
AMPHETAMINES: NOT DETECTED
Barbiturates: NOT DETECTED
Benzodiazepines: POSITIVE — AB
Cocaine: NOT DETECTED
Opiates: POSITIVE — AB
TETRAHYDROCANNABINOL: NOT DETECTED

## 2015-04-09 LAB — COMPREHENSIVE METABOLIC PANEL
ALBUMIN: 4.1 g/dL (ref 3.5–5.0)
ALK PHOS: 68 U/L (ref 38–126)
ALT: 86 U/L — ABNORMAL HIGH (ref 17–63)
ANION GAP: 8 (ref 5–15)
AST: 67 U/L — ABNORMAL HIGH (ref 15–41)
BILIRUBIN TOTAL: 1.1 mg/dL (ref 0.3–1.2)
BUN: 14 mg/dL (ref 6–20)
CALCIUM: 9 mg/dL (ref 8.9–10.3)
CO2: 23 mmol/L (ref 22–32)
Chloride: 104 mmol/L (ref 101–111)
Creatinine, Ser: 1 mg/dL (ref 0.61–1.24)
GFR calc Af Amer: >60 mL/min (ref >60)
GLUCOSE: 121 mg/dL — AB (ref 65–99)
POTASSIUM: 4 mmol/L (ref 3.5–5.1)
Sodium: 135 mmol/L (ref 135–145)
TOTAL PROTEIN: 7.7 g/dL (ref 6.5–8.1)

## 2015-04-09 LAB — GLUCOSE, CAPILLARY: Glucose-Capillary: 130 mg/dL — ABNORMAL HIGH (ref 65–99)

## 2015-04-09 LAB — ACETAMINOPHEN LEVEL

## 2015-04-09 LAB — ETHANOL

## 2015-04-09 LAB — SALICYLATE LEVEL: Salicylate Lvl: <4 mg/dL (ref 2.8–30.0)

## 2015-04-09 MED ORDER — GABAPENTIN 800 MG PO TABS: 2400.0000 mg | ORAL_TABLET | Freq: Two times a day (BID) | ORAL | Status: DC

## 2015-04-09 MED ORDER — INFLUENZA VAC SPLIT QUAD 0.5 ML IM SUSY
0.5000 mL | PREFILLED_SYRINGE | INTRAMUSCULAR | Status: AC
  Administered 2015-04-10: 0.5 mL via INTRAMUSCULAR
  Filled 2015-04-09: qty 0.5

## 2015-04-09 MED ORDER — ONDANSETRON 4 MG PO TBDP: 4.0000 mg | ORAL_TABLET | Freq: Four times a day (QID) | ORAL | Status: DC | PRN

## 2015-04-09 MED ORDER — GABAPENTIN 400 MG PO CAPS
800.0000 mg | ORAL_CAPSULE | Freq: Three times a day (TID) | ORAL | Status: DC
  Administered 2015-04-09 – 2015-04-10 (×3): 800 mg via ORAL
  Filled 2015-04-09 (×10): qty 2

## 2015-04-09 MED ORDER — LINAGLIPTIN 5 MG PO TABS
5.0000 mg | ORAL_TABLET | Freq: Every day | ORAL | Status: DC
  Filled 2015-04-09 (×2): qty 1

## 2015-04-09 MED ORDER — HYDROXYZINE HCL 25 MG PO TABS
25.0000 mg | ORAL_TABLET | Freq: Four times a day (QID) | ORAL | Status: DC | PRN
  Administered 2015-04-09 – 2015-04-10 (×3): 25 mg via ORAL
  Filled 2015-04-09 (×4): qty 1

## 2015-04-09 MED ORDER — ASPIRIN 325 MG PO TABS
325.0000 mg | ORAL_TABLET | Freq: Every day | ORAL | Status: DC
  Administered 2015-04-09: 325 mg via ORAL
  Filled 2015-04-09: qty 1

## 2015-04-09 MED ORDER — ALBUTEROL SULFATE HFA 108 (90 BASE) MCG/ACT IN AERS: 2.0000 | INHALATION_SPRAY | Freq: Four times a day (QID) | RESPIRATORY_TRACT | Status: DC | PRN

## 2015-04-09 MED ORDER — GABAPENTIN 300 MG PO CAPS: 300.0000 mg | ORAL_CAPSULE | Freq: Three times a day (TID) | ORAL | Status: DC

## 2015-04-09 MED ORDER — PANTOPRAZOLE SODIUM 40 MG PO TBEC
40.0000 mg | DELAYED_RELEASE_TABLET | Freq: Every day | ORAL | Status: DC
  Administered 2015-04-10: 40 mg via ORAL
  Filled 2015-04-09 (×3): qty 1

## 2015-04-09 MED ORDER — TRAZODONE HCL 50 MG PO TABS
50.0000 mg | ORAL_TABLET | Freq: Every evening | ORAL | Status: DC | PRN
  Administered 2015-04-09: 50 mg via ORAL
  Filled 2015-04-09 (×8): qty 1

## 2015-04-09 MED ORDER — IBUPROFEN 200 MG PO TABS: 600.0000 mg | ORAL_TABLET | Freq: Three times a day (TID) | ORAL | Status: DC | PRN

## 2015-04-09 MED ORDER — METFORMIN HCL 500 MG PO TABS
500.0000 mg | ORAL_TABLET | Freq: Two times a day (BID) | ORAL | Status: DC
  Filled 2015-04-09 (×2): qty 1

## 2015-04-09 MED ORDER — DICYCLOMINE HCL 20 MG PO TABS: 20.0000 mg | ORAL_TABLET | Freq: Four times a day (QID) | ORAL | Status: DC | PRN

## 2015-04-09 MED ORDER — LOPERAMIDE HCL 2 MG PO CAPS: 2.0000 mg | ORAL_CAPSULE | ORAL | Status: DC | PRN

## 2015-04-09 MED ORDER — HYDROXYZINE HCL 25 MG PO TABS: 25.0000 mg | ORAL_TABLET | Freq: Three times a day (TID) | ORAL | Status: DC

## 2015-04-09 MED ORDER — HYDROXYZINE HCL 25 MG PO TABS: 25.0000 mg | ORAL_TABLET | Freq: Four times a day (QID) | ORAL | Status: DC | PRN

## 2015-04-09 MED ORDER — PRAVASTATIN SODIUM 40 MG PO TABS
40.0000 mg | ORAL_TABLET | Freq: Every day | ORAL | Status: DC
  Filled 2015-04-09 (×3): qty 1

## 2015-04-09 MED ORDER — NAPROXEN 500 MG PO TABS: 500.0000 mg | ORAL_TABLET | Freq: Two times a day (BID) | ORAL | Status: DC | PRN

## 2015-04-09 MED ORDER — TETANUS-DIPHTH-ACELL PERTUSSIS 5-2.5-18.5 LF-MCG/0.5 IM SUSP: 0.5000 mL | Freq: Once | INTRAMUSCULAR | Status: DC

## 2015-04-09 MED ORDER — SITAGLIPTIN PHOS-METFORMIN HCL 50-500 MG PO TABS: 1.0000 | ORAL_TABLET | Freq: Two times a day (BID) | ORAL | Status: DC

## 2015-04-09 MED ORDER — CLONIDINE HCL 0.1 MG PO TABS
0.1000 mg | ORAL_TABLET | Freq: Four times a day (QID) | ORAL | Status: DC
  Administered 2015-04-09 – 2015-04-10 (×3): 0.1 mg via ORAL
  Filled 2015-04-09 (×10): qty 1

## 2015-04-09 MED ORDER — METHOCARBAMOL 500 MG PO TABS
500.0000 mg | ORAL_TABLET | Freq: Three times a day (TID) | ORAL | Status: DC | PRN
  Administered 2015-04-09 – 2015-04-10 (×2): 500 mg via ORAL
  Filled 2015-04-09 (×2): qty 1

## 2015-04-09 MED ORDER — FUROSEMIDE 20 MG PO TABS
20.0000 mg | ORAL_TABLET | Freq: Every day | ORAL | Status: DC
  Filled 2015-04-09 (×3): qty 1

## 2015-04-09 MED ORDER — GABAPENTIN 400 MG PO CAPS: 800.0000 mg | ORAL_CAPSULE | Freq: Three times a day (TID) | ORAL | Status: DC

## 2015-04-09 MED ORDER — NAPROXEN 500 MG PO TABS
500.0000 mg | ORAL_TABLET | Freq: Two times a day (BID) | ORAL | Status: DC | PRN
  Administered 2015-04-09 – 2015-04-10 (×2): 500 mg via ORAL
  Filled 2015-04-09 (×2): qty 1

## 2015-04-09 MED ORDER — KETOROLAC TROMETHAMINE 30 MG/ML IJ SOLN
30.0000 mg | Freq: Once | INTRAMUSCULAR | Status: AC
  Administered 2015-04-09: 30 mg via INTRAMUSCULAR
  Filled 2015-04-09: qty 1

## 2015-04-09 MED ORDER — METHOCARBAMOL 500 MG PO TABS: 500.0000 mg | ORAL_TABLET | Freq: Three times a day (TID) | ORAL | Status: DC | PRN

## 2015-04-09 MED ORDER — ALUM & MAG HYDROXIDE-SIMETH 200-200-20 MG/5ML PO SUSP: 30.0000 mL | ORAL | Status: DC | PRN

## 2015-04-09 MED ORDER — LINAGLIPTIN 5 MG PO TABS
5.0000 mg | ORAL_TABLET | Freq: Every day | ORAL | Status: DC
  Administered 2015-04-09 – 2015-04-10 (×2): 5 mg via ORAL
  Filled 2015-04-09 (×5): qty 1

## 2015-04-09 MED ORDER — METFORMIN HCL 500 MG PO TABS
500.0000 mg | ORAL_TABLET | Freq: Two times a day (BID) | ORAL | Status: DC
  Administered 2015-04-09 – 2015-04-10 (×2): 500 mg via ORAL
  Filled 2015-04-09 (×7): qty 1

## 2015-04-09 MED ORDER — CLONIDINE HCL 0.1 MG PO TABS
0.1000 mg | ORAL_TABLET | Freq: Four times a day (QID) | ORAL | Status: DC
  Administered 2015-04-09: 0.1 mg via ORAL
  Filled 2015-04-09: qty 1

## 2015-04-09 MED ORDER — IBUPROFEN 600 MG PO TABS
600.0000 mg | ORAL_TABLET | Freq: Three times a day (TID) | ORAL | Status: DC | PRN
  Administered 2015-04-09: 600 mg via ORAL
  Filled 2015-04-09: qty 1

## 2015-04-09 MED ORDER — LISINOPRIL 20 MG PO TABS
40.0000 mg | ORAL_TABLET | Freq: Two times a day (BID) | ORAL | Status: DC
  Administered 2015-04-09 – 2015-04-10 (×2): 40 mg via ORAL
  Filled 2015-04-09 (×3): qty 2
  Filled 2015-04-09: qty 1
  Filled 2015-04-09: qty 2
  Filled 2015-04-09 (×2): qty 1
  Filled 2015-04-09: qty 2
  Filled 2015-04-09: qty 1

## 2015-04-09 MED ORDER — PANTOPRAZOLE SODIUM 40 MG PO TBEC
40.0000 mg | DELAYED_RELEASE_TABLET | Freq: Every day | ORAL | Status: DC
Start: 2015-04-09 — End: 2015-04-09
  Administered 2015-04-09: 40 mg via ORAL
  Filled 2015-04-09: qty 1

## 2015-04-09 MED ORDER — NICOTINE 21 MG/24HR TD PT24
21.0000 mg | MEDICATED_PATCH | Freq: Every day | TRANSDERMAL | Status: DC
  Filled 2015-04-09 (×4): qty 1

## 2015-04-09 MED ORDER — ASPIRIN 325 MG PO TABS
325.0000 mg | ORAL_TABLET | Freq: Every day | ORAL | Status: DC
  Administered 2015-04-10: 325 mg via ORAL
  Filled 2015-04-09 (×3): qty 1

## 2015-04-09 MED ORDER — CLONIDINE HCL 0.1 MG PO TABS
0.1000 mg | ORAL_TABLET | ORAL | Status: DC
  Filled 2015-04-09 (×2): qty 1

## 2015-04-09 MED ORDER — LISINOPRIL 40 MG PO TABS
40.0000 mg | ORAL_TABLET | Freq: Two times a day (BID) | ORAL | Status: DC
  Administered 2015-04-09: 40 mg via ORAL
  Filled 2015-04-09 (×2): qty 1

## 2015-04-09 MED ORDER — FUROSEMIDE 20 MG PO TABS
20.0000 mg | ORAL_TABLET | Freq: Every day | ORAL | Status: DC
  Administered 2015-04-09: 20 mg via ORAL
  Filled 2015-04-09: qty 1

## 2015-04-09 MED ORDER — CLONIDINE HCL 0.1 MG PO TABS: 0.1000 mg | ORAL_TABLET | Freq: Every day | ORAL | Status: DC

## 2015-04-09 MED ORDER — PRAVASTATIN SODIUM 40 MG PO TABS
40.0000 mg | ORAL_TABLET | Freq: Every day | ORAL | Status: DC
  Filled 2015-04-09: qty 1

## 2015-04-09 MED ORDER — MAGNESIUM HYDROXIDE 400 MG/5ML PO SUSP: 30.0000 mL | Freq: Every day | ORAL | Status: DC | PRN

## 2015-04-09 MED ORDER — CLONIDINE HCL 0.1 MG PO TABS: 0.1000 mg | ORAL_TABLET | ORAL | Status: DC

## 2015-04-09 NOTE — Progress Notes (Signed)
Adult Psychoeducational Group Note  Date:  04/09/2015 Time:  9:29 PM  Group Topic/Focus:  Wrap-Up Group:   The focus of this group is to help patients review their daily goal of treatment and discuss progress on daily workbooks.  Participation Level:  Active  Participation Quality:  Appropriate  Affect:  Appropriate  Cognitive:  Alert  Insight: Appropriate  Engagement in Group:  Engaged  Modes of Intervention:  Discussion  Additional Comments:  Patient goal for today was to see his wife. On a scale from 1-10, (1=worst, 10=best) patient rated his day as a 10 but ended at a 0.   Carmichael Burdette L Rashel Okeefe 04/09/2015, 9:29 PM
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
## 2015-04-09 NOTE — ED Notes (Signed)
Discharge note: [facility] transport at facility to D/C pt to [facility] adult unit. Report called to [place], Charity fundraiser. Pt denies SI/HI. Denies AVH. Pt signed for personal belongings at discharge. Belongings given to transport. Ambulatory out of facility Will continue to monitor.

## 2015-04-09 NOTE — BHH Counselor (Signed)
Psych disposition: Per Donell Sievert, PA-C, Pt meets inpt tx criteria. No appropriate (300 hall) beds at [facility] currently. Will be considered for [facility] bed later this morning, pending discharges.  Counselor informed Dr Wilkie Aye of disposition.   Cyndie Mull, [facility] Therapeutic Triage
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
## 2015-04-09 NOTE — Progress Notes (Signed)
Patient upset because his two sons visited him and he just found out he is scheduled for court tomorrow morning 04/10/2015.  Nurse advised patient to talk to SW/MD tomorrow morning and not to be worried tonight.  To try and rest and sleep.  Respirations even and unlabored.  No signs/symptoms of pain/distress noted on patient's face/body movements.

## 2015-04-09 NOTE — ED Notes (Signed)
Neurologist at bedside evaluating pt.

## 2015-04-09 NOTE — Consult Note (Signed)
[facility] Consult   Reason for Consult:  Depression, suicidal ideation Referring Physician:  EDP Patient Identification: Bryan Gallegos MRN:  [id number] Principal Diagnosis: Bipolar 1 disorder, mixed, moderate (Bryan Gallegos) Diagnosis:   Patient Active Problem List   Diagnosis Date Noted  . Bipolar 1 disorder, mixed, moderate ([name]) [F31.62] 01/28/2014    Priority: High  . Benzodiazepine abuse [F13.10] 04/09/2015  . Opioid use disorder, moderate, dependence ([place]) [F11.20] 04/09/2015  . Drug-seeking behavior [F19.10] 03/14/2014  . Left fibular fracture [S82.402A] 03/11/2014  . Pedal edema [R60.0] 02/14/2014  . Chronic back pain greater than 3 months duration [M54.9, G89.29] 01/28/2014  . Gastroesophageal reflux disease with esophagitis [K21.0] 01/28/2014  . Essential hypertension, benign [I10] 01/28/2014  . Asthma, mild persistent [J45.30] 01/28/2014  . Hyperlipidemia with target LDL less than 100 [E78.5] 01/28/2014  . Diabetes mellitus type II, controlled (Bryan Gallegos) [E11.9] 01/28/2014  . Cellulitis of foot [L03.119] 01/28/2014  . Morbid obesity (Bryan Gallegos) [E66.01] 01/28/2014  . Morbidly obese ([name]) [E66.01] 09/02/2013  . Depression [F32.9] 09/02/2013  . 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 TIA (transient ischemic attack) [G45.9] 09/02/2013  . Hemiplegia, unspecified, affecting nondominant side [G81.90] 08/06/2013  . Chest pain [R07.9] 08/06/2013  . Diabetes mellitus (Bryan Gallegos) [E11.9] 08/06/2013  . HLD (hyperlipidemia) [E78.5] 08/06/2013  . OSA (obstructive sleep apnea) [G47.33] 08/06/2013  . Left-sided weakness [M62.89] 08/06/2013    Total Time spent with patient: 45 minutes  Subjective:   Bryan Gallegos is a 40 y.o. male patient admitted with  Depression, suicidal ideation.  HPI: Caucasian male, 40 years old was evaluated for depression and suicidal ideation.  Patient was seen and released from [facility] ER yesterday and he came here seeking more treatment.  P[atient  reports that he swallowed batteries 4 days ago and that the battery is still inside him.  Patient states that he swallowed the battery to kill himself.  Patient reports that he was released from jail yesterday after he was sent there by huis brother.  Patient reports tremendous stressors from his family especially his brother who plans to send their mother to a Nursing home.  Patient reports a hx of depression and stated that he is unable to have a Psychiatrist see him in the community.  Patient states that some of the facilities are refusing to see him.  Patient denies use or abuse of drugs but his UDS is positive for Benzodiazepine and Opiates.  He has been accepted for admission  At [facility] and he has been transferred to his assigned bed.  Past Psychiatric History:  Bipolar 1 disorder, mixed, Opioid use disorder  Risk to Self: Suicidal Ideation: Yes-Currently Present Suicidal Intent: Yes-Currently Present Is patient at risk for suicide?: Yes Suicidal Plan?: Yes-Currently Present Specify Current Suicidal Plan: Cutting wrists, shooting self Access to Means: Yes Specify Access to Suicidal Means: Anything sharp, access to family's guns What has been your use of drugs/alcohol within the last 12 months?: Use of benzos and opioids; Suspected IV drug use, per chart. No etoh abuse hx. How many times?: 5 Other Self Harm Risks: SA Triggers for Past Attempts: Unpredictable Intentional Self Injurious Behavior: Cutting Comment - Self Injurious Behavior: Pt cut wrists tonight; Pt swallowed batteries last night in attempt to harm self. Risk to Others: Homicidal Ideation: No Thoughts of Harm to Others: Yes-Currently Present Comment - Thoughts of Harm to Others: Thoughts to harm brother who put pt in jail for 15 days Current Homicidal Intent: No Current Homicidal Plan: No Describe Current Homicidal  Plan: "Want to ring his neck" Access to Homicidal Means: Yes Describe Access to Homicidal Means: Access to  firearms Identified Victim: Brother History of harm to others?: Yes Assessment of Violence: In distant past Violent Behavior Description: Hx of simple assault charge in 2010; No known recent violent hx Does patient have access to weapons?: Yes (Comment) (Access to family's firearms) Criminal Charges Pending?: No Does patient have a court date: Yes Court Date:  ("A few months away") Prior Inpatient Therapy: Prior Inpatient Therapy: Yes Prior Therapy Dates: Multiple; Last [facility] admission in 2005 Prior Therapy Facilty/Provider(s): [facility], [place], [facility], etc Reason for Treatment: SI Prior Outpatient Therapy: Prior Outpatient Therapy: Yes Prior Therapy Dates: In past Prior Therapy Facilty/Provider(s): Dr Rosine Door Reason for Treatment: Med Management Does patient have an ACCT team?: No Does patient have Intensive In-House Services?  : No Does patient have [facility] services? : No Does patient have P4CC services?: No  Past Medical History:  Past Medical History  Diagnosis Date  . Heart valve disorder     s/p echocardiogram  . Hypertension   . Asthma   . Diabetes mellitus   . Hyperlipidemia   . Anaphylactic reaction   . Bipolar disorder (Bryan Gallegos)   . Adult ADHD   . Morbidly obese (Bryan Gallegos)   . Transient cerebral ischemia     Unknown  . OSA (obstructive sleep apnea)   . Anxiety   . Complication of anesthesia     Per patient difficult intubation;  . Difficult intubation     Per patient    Past Surgical History  Procedure Laterality Date  . Carpal tunnel release    . Nose surgery    . Wisdom tooth extraction    . Tooth extraction     Family History:  Family History  Problem Relation Age of Onset  . CAD Mother     Living  . Diabetes Mellitus II Mother   . Stroke Mother   . Hypertension Mother   . Congestive Heart Failure Mother   . Kidney disease Mother   . Fibromyalgia Mother   . Thyroid disease Mother   . Hyperlipidemia Mother   . Liver disease Mother   . Alcoholism Father 73     Deceased  . Arthritis Maternal Grandmother   . Congestive Heart Failure Maternal Grandmother   . Hypertension Maternal Grandmother   . Lung cancer Maternal Grandfather   . Colon cancer Maternal Aunt   . Stomach cancer Maternal Aunt   . Heart disease Other     Paternal & Maternal  . Hypertension Other     Paternal & Maternal  . Hypertension Brother     x3  . Hypertension Sister     #1  . Bipolar disorder Sister     #1  . ADD / ADHD Son     x3  . Bipolar disorder Son     x3  . Asperger's syndrome Son    Family Psychiatric  History: Unknown Social History:  History  Alcohol Use  . 0.0 oz/week  . 0 Standard drinks or equivalent per week    Comment: rare     History  Drug Use No    Social History   Social History  . Marital Status: Single    Spouse Name: N/A  . Number of Children: N/A  . Years of Education: N/A   Social History Main Topics  . Smoking status: Current Every Day Smoker -- 1.00 packs/day for 23 years    Types: Cigarettes  .  Smokeless tobacco: Never Used  . Alcohol Use: 0.0 oz/week    0 Standard drinks or equivalent per week     Comment: rare  . Drug Use: No  . Sexual Activity:    Partners: Female   Other Topics Concern  . None   Social History Narrative   Additional Social History:    Allergies:   Allergies  Allergen Reactions  . Atenolol Anaphylaxis  . Other Anaphylaxis    "Prednisone like drugs"  . Tylenol [Acetaminophen] Other (See Comments)    GI Bleed    Labs:  Results for orders placed or performed during the hospital encounter of 04/09/15 (from the past 48 hour(s))  Comprehensive metabolic panel     Status: Abnormal   Collection Time: 04/09/15  3:59 AM  Result Value Ref Range   Sodium 135 135 - 145 mmol/L   Potassium 4.0 3.5 - 5.1 mmol/L   Chloride 104 101 - 111 mmol/L   CO2 23 22 - 32 mmol/L   Glucose, Bld 121 (H) 65 - 99 mg/dL   BUN 14 6 - 20 mg/dL   Creatinine, Ser 1.00 0.61 - 1.24 mg/dL   Calcium 9.0 8.9 - 10.3  mg/dL   Total Protein 7.7 6.5 - 8.1 g/dL   Albumin 4.1 3.5 - 5.0 g/dL   AST 67 (H) 15 - 41 U/L   ALT 86 (H) 17 - 63 U/L   Alkaline Phosphatase 68 38 - 126 U/L   Total Bilirubin 1.1 0.3 - 1.2 mg/dL   GFR calc non Af Amer >60 >60 mL/min   GFR calc Af Amer >60 >60 mL/min    Comment: (NOTE) The eGFR has been calculated using the CKD EPI equation. This calculation has not been validated in all clinical situations. eGFR's persistently <60 mL/min signify possible Chronic Kidney Disease.    Anion gap 8 5 - 15  Ethanol (ETOH)     Status: None   Collection Time: 04/09/15  3:59 AM  Result Value Ref Range   Alcohol, Ethyl (B) <5 <5 mg/dL    Comment:        LOWEST DETECTABLE LIMIT FOR SERUM ALCOHOL IS 5 mg/dL FOR MEDICAL PURPOSES ONLY   Salicylate level     Status: None   Collection Time: 04/09/15  3:59 AM  Result Value Ref Range   Salicylate Lvl <6.9 2.8 - 30.0 mg/dL  Acetaminophen level     Status: Abnormal   Collection Time: 04/09/15  3:59 AM  Result Value Ref Range   Acetaminophen (Tylenol), Serum <10 (L) 10 - 30 ug/mL    Comment:        THERAPEUTIC CONCENTRATIONS VARY SIGNIFICANTLY. A RANGE OF 10-30 ug/mL MAY BE AN EFFECTIVE CONCENTRATION FOR MANY PATIENTS. HOWEVER, SOME ARE BEST TREATED AT CONCENTRATIONS OUTSIDE THIS RANGE. ACETAMINOPHEN CONCENTRATIONS >150 ug/mL AT 4 HOURS AFTER INGESTION AND >50 ug/mL AT 12 HOURS AFTER INGESTION ARE OFTEN ASSOCIATED WITH TOXIC REACTIONS.   Urine rapid drug screen (hosp performed) (Not at [facility])     Status: Abnormal   Collection Time: 04/09/15  4:37 AM  Result Value Ref Range   Opiates POSITIVE (A) NONE DETECTED   Cocaine NONE DETECTED NONE DETECTED   Benzodiazepines POSITIVE (A) NONE DETECTED   Amphetamines NONE DETECTED NONE DETECTED   Tetrahydrocannabinol NONE DETECTED NONE DETECTED   Barbiturates NONE DETECTED NONE DETECTED    Comment:        DRUG SCREEN FOR MEDICAL PURPOSES ONLY.  IF CONFIRMATION IS NEEDED FOR ANY PURPOSE,  NOTIFY LAB WITHIN 5 DAYS.        LOWEST DETECTABLE LIMITS FOR URINE DRUG SCREEN Drug Class       Cutoff (ng/mL) Amphetamine      1000 Barbiturate      200 Benzodiazepine   213 Tricyclics       086 Opiates          300 Cocaine          300 THC              50   CBC     Status: None   Collection Time: 04/09/15  5:25 AM  Result Value Ref Range   WBC 9.2 4.0 - 10.5 K/uL   RBC 4.92 4.22 - 5.81 MIL/uL   Hemoglobin 15.3 13.0 - 17.0 g/dL   HCT 45.6 39.0 - 52.0 %   MCV 92.7 78.0 - 100.0 fL   MCH 31.1 26.0 - 34.0 pg   MCHC 33.6 30.0 - 36.0 g/dL   RDW 12.6 11.5 - 15.5 %   Platelets 215 150 - 400 K/uL    Current Facility-Administered Medications  Medication Dose Route Frequency Provider Last Rate Last Dose  . albuterol (PROVENTIL HFA;VENTOLIN HFA) 108 (90 Base) MCG/ACT inhaler 2 puff  2 puff Inhalation Q6H PRN Maigan Bittinger, MD      . aspirin tablet 325 mg  325 mg Oral Daily Sheza Strickland, MD   325 mg at 04/09/15 1248  . cloNIDine (CATAPRES) tablet 0.1 mg  0.1 mg Oral QID Corena Pilgrim, MD   0.1 mg at 04/09/15 1249   Followed by  . [START ON 04/11/2015] cloNIDine (CATAPRES) tablet 0.1 mg  0.1 mg Oral BH-qamhs Corena Pilgrim, MD       Followed by  . [START ON 04/13/2015] cloNIDine (CATAPRES) tablet 0.1 mg  0.1 mg Oral QAC breakfast Reshaun Briseno, MD      . dicyclomine (BENTYL) tablet 20 mg  20 mg Oral Q6H PRN Letitia Sabala, MD      . furosemide (LASIX) tablet 20 mg  20 mg Oral Daily Khalidah Herbold, MD   20 mg at 04/09/15 1249  . gabapentin (NEURONTIN) capsule 800 mg  800 mg Oral TID Corena Pilgrim, MD      . hydrOXYzine (ATARAX/VISTARIL) tablet 25 mg  25 mg Oral Q6H PRN Katsumi Wisler, MD      . ibuprofen (ADVIL,MOTRIN) tablet 600 mg  600 mg Oral Q8H PRN Merryl Hacker, MD      . linagliptin (TRADJENTA) tablet 5 mg  5 mg Oral QAC breakfast Merryl Hacker, MD       And  . metFORMIN (GLUCOPHAGE) tablet 500 mg  500 mg Oral BID WC Merryl Hacker, MD      . lisinopril  (PRINIVIL,ZESTRIL) tablet 40 mg  40 mg Oral BID Corena Pilgrim, MD   40 mg at 04/09/15 1250  . loperamide (IMODIUM) capsule 2-4 mg  2-4 mg Oral PRN Corena Pilgrim, MD      . methocarbamol (ROBAXIN) tablet 500 mg  500 mg Oral Q8H PRN Kyrielle Urbanski, MD      . naproxen (NAPROSYN) tablet 500 mg  500 mg Oral BID PRN Corena Pilgrim, MD      . ondansetron (ZOFRAN-ODT) disintegrating tablet 4 mg  4 mg Oral Q6H PRN Duwan Adrian, MD      . pantoprazole (PROTONIX) EC tablet 40 mg  40 mg Oral Daily Nethra Mehlberg, MD   40 mg at 04/09/15 1249  . pravastatin (  PRAVACHOL) tablet 40 mg  40 mg Oral QHS Britanee Vanblarcom, MD      . Tdap (BOOSTRIX) injection 0.5 mL  0.5 mL Intramuscular Once Merryl Hacker, MD       Current Outpatient Prescriptions  Medication Sig Dispense Refill  . albuterol (PROVENTIL HFA;VENTOLIN HFA) 108 (90 BASE) MCG/ACT inhaler Inhale 2 puffs into the lungs every 6 (six) hours as needed for wheezing or shortness of breath. 1 Inhaler 0  . ALPRAZolam (XANAX) 1 MG tablet Take 1 mg by mouth 4 (four) times daily.     Marland Kitchen amphetamine-dextroamphetamine (ADDERALL) 30 MG tablet Take 30 mg by mouth 2 (two) times daily.    Marland Kitchen aspirin 325 MG tablet Take 1 tablet (325 mg total) by mouth daily.    . cholecalciferol (VITAMIN D) 1000 UNITS tablet Take 1,000 Units by mouth daily.    . cloNIDine (CATAPRES) 0.2 MG tablet Take 1 tablet (0.2 mg total) by mouth at bedtime. 30 tablet 0  . furosemide (LASIX) 20 MG tablet Take 1 tablet (20 mg total) by mouth daily. (Patient taking differently: Take 20 mg by mouth daily as needed for fluid or edema. ) 30 tablet 3  . gabapentin (NEURONTIN) 800 MG tablet Take 2,400 mg by mouth 2 (two) times daily.    Marland Kitchen lisinopril (PRINIVIL,ZESTRIL) 40 MG tablet Take 40 mg by mouth 2 (two) times daily.    . naproxen (NAPROSYN) 500 MG tablet Take 1 tablet (500 mg total) by mouth 2 (two) times daily. (Patient taking differently: Take 500 mg by mouth 2 (two) times daily as needed for  mild pain. ) 14 tablet 0  . omeprazole (PRILOSEC) 20 MG capsule Take 1 capsule (20 mg total) by mouth daily. 30 capsule 0  . oxycodone (ROXICODONE) 30 MG immediate release tablet Take 30 mg by mouth every 4 (four) hours. For pain  0  . pravastatin (PRAVACHOL) 40 MG tablet Take 1 tablet (40 mg total) by mouth at bedtime. 30 tablet 3  . sitaGLIPtin-metformin (JANUMET) 50-500 MG per tablet Take 1 tablet by mouth 2 (two) times daily with a meal.    . zolpidem (AMBIEN) 10 MG tablet Take 10 mg by mouth at bedtime.     . citalopram (CELEXA) 10 MG tablet Take 1 tablet (10 mg total) by mouth daily. (Patient not taking: Reported on 03/30/2015) 30 tablet 1  . metFORMIN (GLUCOPHAGE) 500 MG tablet Take 1 tablet (500 mg total) by mouth 2 (two) times daily with a meal. (Patient not taking: Reported on 05/26/2014) 180 tablet 3    Musculoskeletal: Strength & Muscle Tone: seen lying down in bed Gait & Station: seen lying down in bed Patient leans: see above  Psychiatric Specialty Exam: Review of Systems  Constitutional: Negative.   HENT: Negative.   Eyes: Negative.   Respiratory: Negative.   Cardiovascular: Negative.   Gastrointestinal: Negative.   Genitourinary: Negative.   Musculoskeletal: Negative.   Skin: Negative.   Neurological: Negative.   Endo/Heme/Allergies: Negative.   see PMH as documented.  Blood pressure 123/72, pulse 81, temperature 97.9 F (36.6 C), temperature source Oral, resp. rate 20, height '6\' 2"'$  (1.88 m), weight 148.326 kg (327 lb), SpO2 100 %.Body mass index is 41.97 kg/(m^2).  General Appearance: Casual  Eye Contact::  Minimal  Speech:  Clear and Coherent and Pressured  Volume:  Normal  Mood:  Angry, Anxious and Irritable  Affect:  Congruent, Depressed and Flat  Thought Process:  Coherent, Goal Directed and Intact  Orientation:  Full (  Time, Place, and Person)  Thought Content:  WDL  Suicidal Thoughts:  Yes.  with intent/plan  Homicidal Thoughts:  No  Memory:  Immediate;    Good Recent;   Good Remote;   Good  Judgement:  Fair  Insight:  Fair  Psychomotor Activity:  Psychomotor Retardation  Concentration:  Good  Recall:  [place] of Knowledge:Fair  Language: Good  Akathisia:  No  Handed:  Right  AIMS (if indicated):     Assets:  Desire for Improvement  ADL's:  Intact  Cognition: WNL  Sleep:      Treatment Plan Summary: Daily contact with patient to assess and evaluate symptoms and progress in treatment and Medication management  Disposition: Accepted for admission and has a bed assigned.  Patient will be transferred as soon as bed becomes available.  Delfin Gant, NP    PMHNP-BC 04/09/2015 2:02 PM Patient seen face-to-face for psychiatric evaluation, chart reviewed and case discussed with the physician extender and developed treatment plan. Reviewed the information documented and agree with the treatment plan. Corena Pilgrim, MD

## 2015-04-09 NOTE — ED Notes (Signed)
Pt admitted to room # 41. Pt behavior calm and cooperative at this time. Pt reports he was discharged from jail yesterday and spent 15 days in jail d/t threatening his brother. Pt reports he threatened to hurt his brother d/t the brother wanting to put the mother in a nursing home. Pt reports a history of suicide attempts and reports he tried to hurt himself in jail. Superficial cuts noted to left wrist. Pt currently denies SI/HI. Denies AVH. Pt reports he is here because he "knows I need help" Special checks q 15 mins initiated for safety. Will continue to monitor.

## 2015-04-09 NOTE — BH Assessment (Signed)
[facility] Assessment Progress Note  Per Thedore Mins, MD, this pt requires psychiatric hospitalization at this time.  Berneice Heinrich, RN, [facility] has assigned pt to [facility] Rm 400-1.  Pt has signed Voluntary Admission and Consent for Treatment, as well as Consent to Release Information to no one, and signed forms have been faxed to [facility].  Pt's nurse has been notified, and agrees to send original paperwork along with pt via [place], and to call report to [phone].  Doylene Canning, MA Triage Specialist [phone]

## 2015-04-09 NOTE — ED Notes (Signed)
Pt arrives to the ER after being discharged from [facility]; pt states that he advised the RN at [facility] that he we suicidal and was advised per pt that when he was discharged to come to [facility] because there was nothing they could do for him there; pt was at [facility] after passing out and was given Narcan, pt c/o left sided weakness and was transferred to [facility] and was evaluated and cleared by neurology; pt states that he was just seen at [facility] recently after swallowing batteries; pt states that he was attempting to cut his wrists this evening; pt states "I need mental help"

## 2015-04-09 NOTE — ED Notes (Signed)
Pt has Tennis shoes, shorts, shirt, and overalls.  Pt has been seen and wanded by security.

## 2015-04-09 NOTE — Tx Team (Signed)
Initial Interdisciplinary Treatment Plan   PATIENT STRESSORS: Financial difficulties Health problems Legal issue Medication change or noncompliance Occupational concerns Substance abuse   PATIENT STRENGTHS: Ability for insight Average or above average intelligence Capable of independent living Communication skills General fund of knowledge Motivation for treatment/growth Supportive family/friends   PROBLEM LIST: Problem List/Patient Goals Date to be addressed Date deferred Reason deferred Estimated date of resolution  "anxiety" 04/09/2015   D/c  "panic" 04/09/2015   D/c  "depression" 04/09/2015   D/c  "homicidal thoughts" 04/09/2015   D/c  "suicidal thoughts" 04/09/2015   D/c                           DISCHARGE CRITERIA:  Ability to meet basic life and health needs Adequate post-discharge living arrangements Improved stabilization in mood, thinking, and/or behavior Medical problems require only outpatient monitoring Motivation to continue treatment in a less acute level of care Need for constant or close observation no longer present Reduction of life-threatening or endangering symptoms to within safe limits Safe-care adequate arrangements made Verbal commitment to aftercare and medication compliance Withdrawal symptoms are absent or subacute and managed without 24-hour nursing intervention  PRELIMINARY DISCHARGE PLAN: Attend aftercare/continuing care group Attend PHP/IOP Attend 12-step recovery group Outpatient therapy Participate in family therapy Return to previous living arrangement  PATIENT/FAMIILY INVOLVEMENT: This treatment plan has been presented to and reviewed with the patient, Bryan Gallegos.  The patient and family have been given the opportunity to ask questions and make suggestions.  [name] 04/09/2015, 7:04 PM

## 2015-04-09 NOTE — ED Provider Notes (Signed)
CSN: [id number]     Arrival date & time 04/09/15  0218 History   By signing my name below, I, Linus Galas, attest that this documentation has been prepared under the direction and in the presence of Shon Baton, MD. Electronically Signed: Linus Galas, ED Scribe. 04/09/2015. 2:45 AM.  Chief Complaint  Patient presents with  . Suicidal   The history is provided by the patient. No language interpreter was used.   HPI Comments: Bryan Gallegos is a 40 y.o. male with a h/o Bipolar disorder and suicidal attempt who presents to the Emergency Department complaining of suicidal ideation, PTA. Pt reports he cut his wrist yesterday and he tried swallowing batteries 2 days ago in hopes that he would "explode."  As per chart review, pt was seen at [facility] on 04/05/15 for swallowing batteries. Dr. Samuel Jester, GI was unable to do an EGD on him however, they found a single battery intact in the stomach. Pt was then seen at [facility] on 04/08/15 for LOC and became responsive after Narcan was administered. He was then transferred to [facility] for an MRI as he was reporting persistent left-sided tingling and numbness. which was negative on 04/08/15. Pt does not see a Psychiatrist. Pt denies any ETOH or substance abuse. Pt does not definitively deny homicidal ideation. Pt is unaware of vaccination history.  Patient states "I've been trying to tell every body I want to kill myself."  Past Medical History  Diagnosis Date  . Heart valve disorder     s/p echocardiogram  . Hypertension   . Asthma   . Diabetes mellitus   . Hyperlipidemia   . Anaphylactic reaction   . Bipolar disorder (HCC)   . Adult ADHD   . Morbidly obese (HCC)   . Transient cerebral ischemia     Unknown  . OSA (obstructive sleep apnea)   . Anxiety   . Complication of anesthesia     Per patient difficult intubation;  . Difficult intubation     Per patient   Past Surgical History  Procedure Laterality Date  . Carpal tunnel  release    . Nose surgery    . Wisdom tooth extraction    . Tooth extraction     Family History  Problem Relation Age of Onset  . CAD Mother     Living  . Diabetes Mellitus II Mother   . Stroke Mother   . Hypertension Mother   . Congestive Heart Failure Mother   . Kidney disease Mother   . Fibromyalgia Mother   . Thyroid disease Mother   . Hyperlipidemia Mother   . Liver disease Mother   . Alcoholism Father 104    Deceased  . Arthritis Maternal Grandmother   . Congestive Heart Failure Maternal Grandmother   . Hypertension Maternal Grandmother   . Lung cancer Maternal Grandfather   . Colon cancer Maternal Aunt   . Stomach cancer Maternal Aunt   . Heart disease Other     Paternal & Maternal  . Hypertension Other     Paternal & Maternal  . Hypertension Brother     x3  . Hypertension Sister     #1  . Bipolar disorder Sister     #1  . ADD / ADHD Son     x3  . Bipolar disorder Son     x3  . Asperger's syndrome Son    Social History  Substance Use Topics  . Smoking status: Current Every Day Smoker --  1.00 packs/day for 23 years    Types: Cigarettes  . Smokeless tobacco: Never Used  . Alcohol Use: 0.0 oz/week    0 Standard drinks or equivalent per week     Comment: rare    Review of Systems  Constitutional: Negative for fever and chills.  Respiratory: Negative for shortness of breath.   Cardiovascular: Negative for chest pain.  Psychiatric/Behavioral: Positive for suicidal ideas and self-injury.  All other systems reviewed and are negative.  Allergies  Atenolol; Other; and Tylenol  Home Medications   Prior to Admission medications   Medication Sig Start Date End Date Taking? Authorizing Provider  albuterol (PROVENTIL HFA;VENTOLIN HFA) 108 (90 BASE) MCG/ACT inhaler Inhale 2 puffs into the lungs every 6 (six) hours as needed for wheezing or shortness of breath. 03/06/14  Yes Waldon Merl, PA-C  ALPRAZolam Prudy Feeler) 1 MG tablet Take 1 mg by mouth 4 (four) times  daily.    Yes Historical Provider, MD  amphetamine-dextroamphetamine (ADDERALL) 30 MG tablet Take 30 mg by mouth 2 (two) times daily.   Yes Historical Provider, MD  aspirin 325 MG tablet Take 1 tablet (325 mg total) by mouth daily. 09/04/13  Yes [name], MD  cholecalciferol (VITAMIN D) 1000 UNITS tablet Take 1,000 Units by mouth daily.   Yes Historical Provider, MD  cloNIDine (CATAPRES) 0.2 MG tablet Take 1 tablet (0.2 mg total) by mouth at bedtime. 03/06/14  Yes Waldon Merl, PA-C  furosemide (LASIX) 20 MG tablet Take 1 tablet (20 mg total) by mouth daily. Patient taking differently: Take 20 mg by mouth daily as needed for fluid or edema.  03/06/14  Yes Waldon Merl, PA-C  gabapentin (NEURONTIN) 800 MG tablet Take 2,400 mg by mouth 2 (two) times daily.   Yes Historical Provider, MD  lisinopril (PRINIVIL,ZESTRIL) 40 MG tablet Take 40 mg by mouth 2 (two) times daily. 05/17/14  Yes Historical Provider, MD  naproxen (NAPROSYN) 500 MG tablet Take 1 tablet (500 mg total) by mouth 2 (two) times daily. Patient taking differently: Take 500 mg by mouth 2 (two) times daily as needed for mild pain.  03/30/15  Yes Vanetta Mulders, MD  omeprazole (PRILOSEC) 20 MG capsule Take 1 capsule (20 mg total) by mouth daily. 03/06/14  Yes Waldon Merl, PA-C  oxycodone (ROXICODONE) 30 MG immediate release tablet Take 30 mg by mouth every 4 (four) hours. For pain 08/12/14  Yes Historical Provider, MD  pravastatin (PRAVACHOL) 40 MG tablet Take 1 tablet (40 mg total) by mouth at bedtime. 03/06/14  Yes Waldon Merl, PA-C  sitaGLIPtin-metformin (JANUMET) 50-500 MG per tablet Take 1 tablet by mouth 2 (two) times daily with a meal.   Yes Historical Provider, MD  zolpidem (AMBIEN) 10 MG tablet Take 10 mg by mouth at bedtime.    Yes Historical Provider, MD  citalopram (CELEXA) 10 MG tablet Take 1 tablet (10 mg total) by mouth daily. Patient not taking: Reported on 03/30/2015 03/06/14   Waldon Merl, PA-C  metFORMIN  (GLUCOPHAGE) 500 MG tablet Take 1 tablet (500 mg total) by mouth 2 (two) times daily with a meal. Patient not taking: Reported on 05/26/2014 03/06/14   Waldon Merl, PA-C   BP 122/85 mmHg  Pulse 110  Temp(Src) 98.3 F (36.8 C) (Oral)  Resp 16  Ht 6\' 2"  (1.88 m)  Wt 327 lb (148.326 kg)  BMI 41.97 kg/m2  SpO2 96% Physical Exam  Constitutional: He is oriented to person, place, and time. He appears  well-developed and well-nourished.  Morbidly obese  HENT:  Head: Normocephalic and atraumatic.  Eyes: Pupils are equal, round, and reactive to light.  Cardiovascular: Normal rate, regular rhythm and normal heart sounds.   No murmur heard. Pulmonary/Chest: Effort normal and breath sounds normal. No respiratory distress. He has no wheezes.  Abdominal: Soft. Bowel sounds are normal. There is no tenderness. There is no rebound and no guarding.  Musculoskeletal: He exhibits no edema.  Neurological: He is alert and oriented to person, place, and time.  Skin: Skin is warm and dry.  Superficial lacerations noted over the left wrist, no active bleeding  Psychiatric: He has a normal mood and affect.  Nursing note and vitals reviewed.   ED Course  Procedures  DIAGNOSTIC STUDIES: Oxygen Saturation is 99% on room air, normal by my interpretation.    COORDINATION OF CARE: 2:57 AM Will order abdominal x-ray, blood test, alcohol and drug test. Will give Tdap injection. Discussed treatment plan with pt at bedside and pt agreed to plan.  Labs Review Labs Reviewed  COMPREHENSIVE METABOLIC PANEL - Abnormal; Notable for the following:    Glucose, Bld 121 (*)    AST 67 (*)    ALT 86 (*)    All other components within normal limits  ACETAMINOPHEN LEVEL - Abnormal; Notable for the following:    Acetaminophen (Tylenol), Serum <10 (*)    All other components within normal limits  URINE RAPID DRUG SCREEN, HOSP PERFORMED - Abnormal; Notable for the following:    Opiates POSITIVE (*)    Benzodiazepines  POSITIVE (*)    All other components within normal limits  ETHANOL  SALICYLATE LEVEL  CBC    Imaging Review Ct Head Wo Contrast  04/08/2015  CLINICAL DATA:  Left-sided weakness and slurred speech. Multiple recent syncopal episodes. The patient fell off a top bunk and hit his head with resultant loss of consciousness. EXAM: CT HEAD WITHOUT CONTRAST TECHNIQUE: Contiguous axial images were obtained from the base of the skull through the vertex without intravenous contrast. COMPARISON:  03/30/2015. FINDINGS: Normal appearing cerebral hemispheres and posterior fossa structures. Normal size and position of the ventricles. No skull fracture, intracranial hemorrhage or paranasal sinus air-fluid levels. Inferior right sphenoid sinus mucosal thickening is again noted. IMPRESSION: 1. No acute abnormality. 2. Mild chronic right sphenoid sinusitis. Electronically Signed   By: Beckie Salts M.D.   On: 04/08/2015 17:34   Mr Brain Wo Contrast (neuro Protocol)  04/09/2015  CLINICAL DATA:  Initial evaluation for 2 day history of left-sided weakness. EXAM: MRI HEAD WITHOUT CONTRAST TECHNIQUE: Multiplanar, multiecho pulse sequences of the brain and surrounding structures were obtained without intravenous contrast. COMPARISON:  Prior CT earlier the same day. FINDINGS: Study mildly degraded by motion artifact. Cerebral volume within normal limits for patient age. No significant white matter disease present. No abnormal foci of restricted diffusion to suggest acute intracranial infarct. Gray-white matter differentiation maintained. Normal intravascular flow voids preserved. No acute or chronic intracranial hemorrhage. No areas of chronic infarction. No mass lesion, midline shift, or mass effect. No hydrocephalus. No extra-axial fluid collection. Craniocervical junction within normal limits. Partially visualized upper cervical spine unremarkable. Pituitary gland within normal limits. No acute abnormality about the orbits. Mild  opacity within the partially visualized right sphenoid sinus. Paranasal sinuses are otherwise largely clear. Scattered opacity within the bilateral mastoid air cells, right greater than left, likely benign. Inner ear structures within normal limits. Bone marrow signal intensity within normal limits. Scalp soft tissues demonstrate no  acute abnormality. IMPRESSION: Negative MRI of the brain. No acute intracranial infarct or other process identified. Electronically Signed   By: Rise Mu M.D.   On: 04/09/2015 00:15   Dg Abd Acute W/chest  04/09/2015  CLINICAL DATA:  40 year old male with recent battery ingestion. EXAM: DG ABDOMEN ACUTE W/ 1V CHEST COMPARISON:  Radiograph dated 04/04/2015 FINDINGS: The radiopaque density noted in the prior study is not visualized on the current exam. No radiopaque foreign object identified. There is no bowel obstruction. No free air. No radiopaque calculi. The lungs are clear. No pleural effusion or pneumothorax the cardiac silhouette is within normal limits. The osseous structures appear unremarkable. IMPRESSION: The previously seen battery is not visualized on this study. No radiopaque foreign object. No evidence of bowel obstruction. Electronically Signed   By: Elgie Collard M.D.   On: 04/09/2015 03:30   I have personally reviewed and evaluated these images and lab results as part of my medical decision-making.   EKG Interpretation ED ECG REPORT   Date: 04/09/2015  Rate: 94  Rhythm: normal sinus rhythm  QRS Axis: right  Intervals: normal  ST/T Wave abnormalities: normal  Conduction Disutrbances:none  Narrative Interpretation:   Old EKG Reviewed: none available  I have personally reviewed the EKG tracing and agree with the computerized printout as noted.       MDM   Final diagnoses:  Suicidal behavior    Patient presents with suicidal behavior. He was just discharged from [facility] after episode of loss of consciousness with a negative MRI  and workup. He is nontoxic. Reports cutting behavior and swallowing batteries in an attempt to hurt himself.  We'll have TTS evaluate. Plain films obtained and show no evidence of persistent foreign body. Basic labwork is reassuring.  Discussed with counselor on call. Patient meets inpatient criteria and will be placed at behavioral health later today.  I personally performed the services described in this documentation, which was scribed in my presence. The recorded information has been reviewed and is accurate.    Shon Baton, MD 04/09/15 [phone]

## 2015-04-10 ENCOUNTER — Inpatient Hospital Stay
Admission: EM | Admit: 2015-04-10 | Discharge: 2015-04-11 | DRG: 951 | Disposition: A | Discharge status: Home / Self Care | Payer: Medicare Other | Source: Intra-hospital | Attending: Psychiatry | Admitting: Psychiatry

## 2015-04-10 ENCOUNTER — Encounter: Payer: Self-pay | Admitting: Psychiatry

## 2015-04-10 ENCOUNTER — Encounter (HOSPITAL_COMMUNITY): Payer: Self-pay | Admitting: Psychiatry

## 2015-04-10 DIAGNOSIS — J45901 Unspecified asthma with (acute) exacerbation: Secondary | ICD-10-CM | POA: Diagnosis present

## 2015-04-10 DIAGNOSIS — F3163 Bipolar disorder, current episode mixed, severe, without psychotic features: Principal | ICD-10-CM

## 2015-04-10 DIAGNOSIS — Z9989 Dependence on other enabling machines and devices: Secondary | ICD-10-CM

## 2015-04-10 DIAGNOSIS — Z841 Family history of disorders of kidney and ureter: Secondary | ICD-10-CM

## 2015-04-10 DIAGNOSIS — Z833 Family history of diabetes mellitus: Secondary | ICD-10-CM

## 2015-04-10 DIAGNOSIS — J4530 Mild persistent asthma, uncomplicated: Secondary | ICD-10-CM

## 2015-04-10 DIAGNOSIS — Z800 Family history of malignant neoplasm of digestive organs: Secondary | ICD-10-CM | POA: Diagnosis not present

## 2015-04-10 DIAGNOSIS — F411 Generalized anxiety disorder: Secondary | ICD-10-CM | POA: Diagnosis present

## 2015-04-10 DIAGNOSIS — K2100 Gastro-esophageal reflux disease with esophagitis, without bleeding: Secondary | ICD-10-CM

## 2015-04-10 DIAGNOSIS — Z811 Family history of alcohol abuse and dependence: Secondary | ICD-10-CM | POA: Diagnosis not present

## 2015-04-10 DIAGNOSIS — F1120 Opioid dependence, uncomplicated: Secondary | ICD-10-CM | POA: Diagnosis not present

## 2015-04-10 DIAGNOSIS — Z801 Family history of malignant neoplasm of trachea, bronchus and lung: Secondary | ICD-10-CM | POA: Diagnosis not present

## 2015-04-10 DIAGNOSIS — F17210 Nicotine dependence, cigarettes, uncomplicated: Secondary | ICD-10-CM | POA: Diagnosis present

## 2015-04-10 DIAGNOSIS — Z8249 Family history of ischemic heart disease and other diseases of the circulatory system: Secondary | ICD-10-CM | POA: Diagnosis not present

## 2015-04-10 DIAGNOSIS — E119 Type 2 diabetes mellitus without complications: Secondary | ICD-10-CM | POA: Diagnosis present

## 2015-04-10 DIAGNOSIS — Z888 Allergy status to other drugs, medicaments and biological substances status: Secondary | ICD-10-CM | POA: Diagnosis not present

## 2015-04-10 DIAGNOSIS — R45851 Suicidal ideations: Secondary | ICD-10-CM

## 2015-04-10 DIAGNOSIS — F602 Antisocial personality disorder: Secondary | ICD-10-CM | POA: Diagnosis present

## 2015-04-10 DIAGNOSIS — Z590 Homelessness: Secondary | ICD-10-CM

## 2015-04-10 DIAGNOSIS — K219 Gastro-esophageal reflux disease without esophagitis: Secondary | ICD-10-CM | POA: Diagnosis present

## 2015-04-10 DIAGNOSIS — E785 Hyperlipidemia, unspecified: Secondary | ICD-10-CM

## 2015-04-10 DIAGNOSIS — F1390 Sedative, hypnotic, or anxiolytic use, unspecified, uncomplicated: Secondary | ICD-10-CM | POA: Diagnosis not present

## 2015-04-10 DIAGNOSIS — Z915 Personal history of self-harm: Secondary | ICD-10-CM | POA: Diagnosis not present

## 2015-04-10 DIAGNOSIS — G8929 Other chronic pain: Secondary | ICD-10-CM | POA: Diagnosis present

## 2015-04-10 DIAGNOSIS — I10 Essential (primary) hypertension: Secondary | ICD-10-CM

## 2015-04-10 DIAGNOSIS — F17200 Nicotine dependence, unspecified, uncomplicated: Secondary | ICD-10-CM | POA: Diagnosis present

## 2015-04-10 DIAGNOSIS — M549 Dorsalgia, unspecified: Secondary | ICD-10-CM | POA: Diagnosis present

## 2015-04-10 DIAGNOSIS — R45850 Homicidal ideations: Secondary | ICD-10-CM | POA: Diagnosis not present

## 2015-04-10 DIAGNOSIS — Z823 Family history of stroke: Secondary | ICD-10-CM | POA: Diagnosis not present

## 2015-04-10 DIAGNOSIS — Z765 Malingerer [conscious simulation]: Secondary | ICD-10-CM | POA: Diagnosis present

## 2015-04-10 DIAGNOSIS — G4733 Obstructive sleep apnea (adult) (pediatric): Secondary | ICD-10-CM | POA: Diagnosis present

## 2015-04-10 DIAGNOSIS — Z8673 Personal history of transient ischemic attack (TIA), and cerebral infarction without residual deficits: Secondary | ICD-10-CM | POA: Diagnosis not present

## 2015-04-10 DIAGNOSIS — F1320 Sedative, hypnotic or anxiolytic dependence, uncomplicated: Secondary | ICD-10-CM | POA: Diagnosis present

## 2015-04-10 DIAGNOSIS — Z9889 Other specified postprocedural states: Secondary | ICD-10-CM

## 2015-04-10 LAB — GLUCOSE, CAPILLARY
GLUCOSE-CAPILLARY: 135 mg/dL — AB (ref 65–99)
GLUCOSE-CAPILLARY: 176 mg/dL — AB (ref 65–99)
Glucose-Capillary: 120 mg/dL — ABNORMAL HIGH (ref 65–99)

## 2015-04-10 MED ORDER — CHLORDIAZEPOXIDE HCL 25 MG PO CAPS: 25.0000 mg | ORAL_CAPSULE | ORAL | Status: DC | PRN

## 2015-04-10 MED ORDER — FUROSEMIDE 20 MG PO TABS: 20.0000 mg | ORAL_TABLET | Freq: Every day | ORAL | Status: DC

## 2015-04-10 MED ORDER — PRAVASTATIN SODIUM 40 MG PO TABS: 40.0000 mg | ORAL_TABLET | Freq: Every day | ORAL | Status: DC

## 2015-04-10 MED ORDER — QUETIAPINE FUMARATE 50 MG PO TABS: 50.0000 mg | ORAL_TABLET | Freq: Every day | ORAL | Status: DC

## 2015-04-10 MED ORDER — CHLORDIAZEPOXIDE HCL 25 MG PO CAPS
ORAL_CAPSULE | ORAL | Status: AC
  Filled 2015-04-10: qty 1

## 2015-04-10 MED ORDER — TRAZODONE HCL 100 MG PO TABS
100.0000 mg | ORAL_TABLET | Freq: Every day | ORAL | Status: DC
  Administered 2015-04-10: 100 mg via ORAL
  Filled 2015-04-10: qty 1

## 2015-04-10 MED ORDER — QUETIAPINE FUMARATE 25 MG PO TABS
50.0000 mg | ORAL_TABLET | Freq: Every day | ORAL | Status: DC
  Administered 2015-04-10: 50 mg via ORAL
  Filled 2015-04-10: qty 2

## 2015-04-10 MED ORDER — TRAZODONE HCL 50 MG PO TABS: 50.0000 mg | ORAL_TABLET | Freq: Every evening | ORAL | Status: DC | PRN

## 2015-04-10 MED ORDER — METFORMIN HCL 500 MG PO TABS: 500.0000 mg | ORAL_TABLET | Freq: Two times a day (BID) | ORAL | Status: DC

## 2015-04-10 MED ORDER — OXCARBAZEPINE 150 MG PO TABS
150.0000 mg | ORAL_TABLET | Freq: Two times a day (BID) | ORAL | Status: DC
  Administered 2015-04-10: 150 mg via ORAL
  Filled 2015-04-10 (×6): qty 1

## 2015-04-10 MED ORDER — GABAPENTIN 400 MG PO CAPS: 800.0000 mg | ORAL_CAPSULE | Freq: Three times a day (TID) | ORAL | Status: DC

## 2015-04-10 MED ORDER — GABAPENTIN 400 MG PO CAPS
800.0000 mg | ORAL_CAPSULE | Freq: Three times a day (TID) | ORAL | Status: DC
  Administered 2015-04-10 – 2015-04-11 (×2): 800 mg via ORAL
  Filled 2015-04-10 (×4): qty 2

## 2015-04-10 MED ORDER — NICOTINE 21 MG/24HR TD PT24
21.0000 mg | MEDICATED_PATCH | Freq: Every day | TRANSDERMAL | Status: DC
  Filled 2015-04-10: qty 1

## 2015-04-10 MED ORDER — LISINOPRIL 40 MG PO TABS: 40.0000 mg | ORAL_TABLET | Freq: Two times a day (BID) | ORAL | Status: DC

## 2015-04-10 MED ORDER — LINAGLIPTIN 5 MG PO TABS: 5.0000 mg | ORAL_TABLET | Freq: Every day | ORAL | Status: DC

## 2015-04-10 MED ORDER — QUETIAPINE FUMARATE 50 MG PO TABS
50.0000 mg | ORAL_TABLET | Freq: Every day | ORAL | Status: DC
  Filled 2015-04-10 (×2): qty 1

## 2015-04-10 MED ORDER — OXCARBAZEPINE 150 MG PO TABS: 150.0000 mg | ORAL_TABLET | Freq: Two times a day (BID) | ORAL | Status: DC

## 2015-04-10 MED ORDER — METFORMIN HCL 500 MG PO TABS
500.0000 mg | ORAL_TABLET | Freq: Two times a day (BID) | ORAL | Status: DC
  Filled 2015-04-10: qty 1

## 2015-04-10 MED ORDER — LISINOPRIL 20 MG PO TABS
40.0000 mg | ORAL_TABLET | Freq: Two times a day (BID) | ORAL | Status: DC
  Administered 2015-04-10 – 2015-04-11 (×2): 40 mg via ORAL
  Filled 2015-04-10 (×2): qty 2

## 2015-04-10 MED ORDER — HALOPERIDOL 5 MG PO TABS: 5.0000 mg | ORAL_TABLET | Freq: Four times a day (QID) | ORAL | Status: DC | PRN

## 2015-04-10 MED ORDER — ASPIRIN 325 MG PO TABS: 325.0000 mg | ORAL_TABLET | Freq: Every day | ORAL | Status: DC

## 2015-04-10 MED ORDER — BENZTROPINE MESYLATE 1 MG PO TABS: 1.0000 mg | ORAL_TABLET | Freq: Four times a day (QID) | ORAL | Status: DC | PRN

## 2015-04-10 MED ORDER — CHLORDIAZEPOXIDE HCL 25 MG PO CAPS
25.0000 mg | ORAL_CAPSULE | ORAL | Status: DC | PRN
  Administered 2015-04-10: 25 mg via ORAL

## 2015-04-10 MED ORDER — NICOTINE 21 MG/24HR TD PT24: 21.0000 mg | MEDICATED_PATCH | Freq: Every day | TRANSDERMAL | Status: DC

## 2015-04-10 MED ORDER — ALBUTEROL SULFATE HFA 108 (90 BASE) MCG/ACT IN AERS: 2.0000 | INHALATION_SPRAY | Freq: Four times a day (QID) | RESPIRATORY_TRACT | Status: DC | PRN

## 2015-04-10 MED ORDER — OMEPRAZOLE 20 MG PO CPDR: 20.0000 mg | DELAYED_RELEASE_CAPSULE | Freq: Every day | ORAL | Status: DC

## 2015-04-10 MED ORDER — PANTOPRAZOLE SODIUM 40 MG PO TBEC
40.0000 mg | DELAYED_RELEASE_TABLET | Freq: Every day | ORAL | Status: DC
  Administered 2015-04-10 – 2015-04-11 (×2): 40 mg via ORAL
  Filled 2015-04-10 (×2): qty 1

## 2015-04-10 NOTE — [facility] Group Notes (Signed)
[facility] Group Notes:  (Nursing/MHT/Case Management/Adjunct)  Date:  04/10/2015   Time:  0930 Type of Therapy:  Nurse Education  Participation Level:  Did not attend.    Mickie Bail 04/10/2015, 12:46 PM

## 2015-04-10 NOTE — [facility] Suicide Risk Assessment (Signed)
[facility] Discharge Suicide Risk Assessment   Principal Problem: Bipolar disorder, curr episode mixed, severe, w/o psychotic features [facility]) Discharge Diagnoses:  Patient Active Problem List   Diagnosis Date Noted  . Bipolar disorder, curr episode mixed, severe, w/o psychotic features (HCC) [F31.63] 04/10/2015  . Opioid use disorder, moderate, dependence (HCC) [F11.20] 04/10/2015  . Moderate benzodiazepine use disorder [F13.90] 04/10/2015  . HTN (hypertension) [I10] 04/10/2015  . Drug-seeking behavior [F19.10] 03/14/2014  . Chronic back pain greater than 3 months duration [M54.9, G89.29] 01/28/2014  . Gastroesophageal reflux disease with esophagitis [K21.0] 01/28/2014  . Essential hypertension, benign [I10] 01/28/2014  . Asthma, mild persistent [J45.30] 01/28/2014  . Diabetes mellitus type II, controlled (HCC) [E11.9] 01/28/2014  . Morbid obesity (HCC) [E66.01] 01/28/2014  . TIA (transient ischemic attack) [G45.9] 09/02/2013  . Chest pain [R07.9] 08/06/2013  . HLD (hyperlipidemia) [E78.5] 08/06/2013  . OSA (obstructive sleep apnea) [G47.33] 08/06/2013    Total Time spent with patient: 30 minutes  Musculoskeletal: Strength & Muscle Tone: within normal limits Gait & Station: normal Patient leans: N/A  Psychiatric Specialty Exam: Review of Systems  Psychiatric/Behavioral: Positive for depression, suicidal ideas (s/p suicide attempt) and substance abuse. The patient is nervous/anxious and has insomnia.   All other systems reviewed and are negative.   Blood pressure 114/49, pulse 85, temperature 97.5 F (36.4 C), temperature source Oral, resp. rate 20, height 5' 11.75" (1.822 m), weight 171.119 kg (377 lb 4 oz).Body mass index is 51.55 kg/(m^2).  General Appearance: Casual  Eye Contact::  Fair  Speech:  Clear and Coherent409  Volume:  Increased  Mood:  Angry, Anxious and Irritable  Affect:  Labile and Tearful  Thought Process:  Coherent  Orientation:  Full (Time, Place, and Person)   Thought Content:  Rumination  Suicidal Thoughts:  s/p suicide attempt, currently denies it  Homicidal Thoughts:  denies at this time  Memory:  Immediate;   Fair Recent;   Fair Remote;   Fair  Judgement:  Impaired  Insight:  Shallow  Psychomotor Activity:  Increased  Concentration:  Poor  Recall:  [employer] of Knowledge:Fair  Language: Fair  Akathisia:  No  Handed:  Right  AIMS (if indicated):     Assets:  Desire for Improvement  Sleep:  Number of Hours: 6.25  Cognition: WNL  ADL's:  Intact   Mental Status Per Nursing Assessment::   On Admission:  Intention to act on plan to harm others  Demographic Factors:  Male and Caucasian  Loss Factors: Legal issues  Historical Factors: Impulsivity  Risk Reduction Factors:   Positive social support  Continued Clinical Symptoms:  Alcohol/Substance Abuse/Dependencies Previous Psychiatric Diagnoses and Treatments  Cognitive Features That Contribute To Risk:  Closed-mindedness, Polarized thinking and Thought constriction (tunnel vision)    Suicide Risk:  Moderate:  Frequent suicidal ideation with limited intensity, and duration, some specificity in terms of plans, no associated intent, good self-control, limited dysphoria/symptomatology, some risk factors present, and identifiable protective factors, including available and accessible social support.  Follow-up Information    Follow up with Transfer to [facility]      Plan Of Care/Follow-up recommendations:  Activity:  as per [facility] provider Diet:  carb modified Tests:  as per lab orders Other:  patient to be transferred to [facility] for further management since his family member is in the same facility here.  Soledad Budreau, MD 04/10/2015, 12:55 PM
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
## 2015-04-10 NOTE — Progress Notes (Signed)
D: Pt presents loud and anxious during our initial interaction. Pt acknowledges the necessity of his admission at [facility]. However, pt does not elaborate on exactly why to Clinical research associate. Pt focuses on being able to communicate and "take care of his wife" while he is here. Pt does verbalize that he is worried about not appearing at his scheduled court date for 04/10/15. Writer reiterated that he should speak with the SW in regards to this matter. Pt denied having any current withdrawal symptoms. Pt denied any SI/HI/AVH. Pt also denied any physical symptoms despite being hypotensive. "I feel great". Clonidine held with fluids given.  A: Writer administered scheduled and prn medications to pt, per MD orders. Continued support and availability as needed was extended to this pt. Staff continues to monitor pt with q37min checks.  R: No adverse drug reactions noted. Pt receptive to treatment. Pt remains safe at this time.

## 2015-04-10 NOTE — Progress Notes (Signed)
  [facility] Adult Case Management Discharge Plan :  Will you be returning to the same living situation after discharge:  No. At discharge, do you have transportation home?: Yes,  [place] or sheriff, tbd Do you have the ability to pay for your medications: Yes,  mental health  Release of information consent forms completed and in the chart;  Patient's signature needed at discharge.  Patient to Follow up at: Follow-up Information    Follow up with Transfer to [facility].      Next level of care provider has access to [facility] Link:yes  Safety Planning and Suicide Prevention discussed: No.  Have you used any form of tobacco in the last 30 days? (Cigarettes, Smokeless Tobacco, Cigars, and/or Pipes): Yes  Has patient been referred to the Quitline?: Will be addressed at [facility]  Patient has been referred for addiction treatment: N/A  Ida Rogue 04/10/2015, 12:07 PM

## 2015-04-10 NOTE — Progress Notes (Signed)
D:  Patient is a 40 year-old male admitted to [facility] ambulatory without difficulty.  Patient is alert and oriented upon admission.  Patient is cooperative but minimizing his situation.  Patient reports that he is in the hospital "because I wanted to see my wife."   A:  Admission assessment completed without difficulty.  Skin and contraband assessment completed with no skin abnormalities nor contraband found.  Q.15 minute safety checks were implemented at the time of admission.  Patient was oriented to the unit and escorted to room #305. R:  Patient was receptive to and cooperative with admission assessment.  Patient contracts for safety on the unit at this time

## 2015-04-10 NOTE — Progress Notes (Signed)
Pt denies SI/HI/AVH. Pt transported to [facility] by [facility]. Pt states receipt of all belongings. Belongings given to [employer]
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
## 2015-04-10 NOTE — BH Assessment (Signed)
Patient has been accepted to [facility].  Accepting physician is Dr. Jennet Maduro.  [facility] to room 305, by [facility] [place] [facility] Charge Nurse [place].  Call report to [phone].  Representative/Transfer Coordinator is Jensen Cheramie.  [facility] Staff (Meghan, TTS/Soical Worker) made aware of acceptance.

## 2015-04-10 NOTE — Progress Notes (Signed)
Report called to Victorino Dike, RN at [facility]

## 2015-04-10 NOTE — BHH Suicide Risk Assessment (Signed)
BHH INPATIENT:  Family/Significant Other Suicide Prevention Education  Suicide Prevention Education:  Patient Discharged to Other Healthcare Facility:  Suicide Prevention Education Not Provided: Transfer to [facility]. The patient is discharging to another healthcare facility for continuation of treatment.  The patient's medical information, including suicide ideations and risk factors, are a part of the medical information shared with the receiving healthcare facility.  Daryel Gerald B 04/10/2015, 12:04 PM

## 2015-04-10 NOTE — Clinical Social Work Note (Signed)
Patient was admitted without knowledge that his wife was already a patient in our system.  When this was discovered, Dr Elna Breslow asked for possible transfer to [facility].  Pt has been accepted and will be transferred today.  PSA, treatment plan, collateral contact were not done on this patient.  Ida Rogue, [place] 04/10/2015, 12:01 PM

## 2015-04-10 NOTE — Progress Notes (Signed)
D: Patient denies SI/HI/AVH. Patient's mood is labile.  Patient states, "I am here to take care of my wife."  Patient is going to transfer to [facility] because his girlfriend of 20+ years is also a patient at [facility].  Staff was unaware of this relationship prior to admission.  Patient initially was upset about his transfer.  He stuck his middle finger up at Dr Elvera Maria closed office door and licked out his tongue.  He also made a statement hoping Dr Elna Breslow "chokes on a peanut." Patient visible on the milieu.  A: Support and encouragement offered. Scheduled medications given to pt. Q 15 min checks continued for patient safety. R: Patient receptive. Patient remains safe on the unit.

## 2015-04-10 NOTE — Discharge Summary (Signed)
Patient was discharged from [facility] and admitted to [facility] because he had family member in the same adult inpatient unit.  He will resume inpatient treatment at [facility].

## 2015-04-10 NOTE — Clinical Social Work Note (Signed)
A letter for [facility] was formulated for pt indicating that he is in the hospital and d/c is unknown.

## 2015-04-10 NOTE — Tx Team (Signed)
Initial Interdisciplinary Treatment Plan   PATIENT STRESSORS: Financial difficulties Health problems Legal issue Medication change or noncompliance Occupational concerns   PATIENT STRENGTHS: Active sense of humor Average or above average intelligence General fund of knowledge   PROBLEM LIST: Problem List/Patient Goals Date to be addressed Date deferred Reason deferred Estimated date of resolution  "getting the hell out of here" 04/10/15     "flirting with all the women around here" 04/10/15                                                DISCHARGE CRITERIA:  Adequate post-discharge living arrangements Verbal commitment to aftercare and medication compliance  PRELIMINARY DISCHARGE PLAN: Outpatient therapy  PATIENT/FAMIILY INVOLVEMENT: This treatment plan has been presented to and reviewed with the patient, Bryan Gallegos.  The patient and family have been given the opportunity to ask questions and make suggestions.  Moshe Salisbury 04/10/2015, 4:47 PM

## 2015-04-11 ENCOUNTER — Emergency Department (HOSPITAL_COMMUNITY): Payer: Medicare Other

## 2015-04-11 ENCOUNTER — Encounter: Payer: Self-pay | Admitting: Psychiatry

## 2015-04-11 ENCOUNTER — Encounter (HOSPITAL_COMMUNITY): Payer: Self-pay | Admitting: *Deleted

## 2015-04-11 ENCOUNTER — Inpatient Hospital Stay (HOSPITAL_COMMUNITY)
Admission: EM | Admit: 2015-04-11 | Discharge: 2015-04-13 | DRG: 918 | Disposition: A | Discharge status: Home / Self Care | Payer: Medicare Other | Attending: Internal Medicine | Admitting: Internal Medicine

## 2015-04-11 DIAGNOSIS — E119 Type 2 diabetes mellitus without complications: Secondary | ICD-10-CM

## 2015-04-11 DIAGNOSIS — G8929 Other chronic pain: Secondary | ICD-10-CM

## 2015-04-11 DIAGNOSIS — Z7984 Long term (current) use of oral hypoglycemic drugs: Secondary | ICD-10-CM

## 2015-04-11 DIAGNOSIS — T50904A Poisoning by unspecified drugs, medicaments and biological substances, undetermined, initial encounter: Secondary | ICD-10-CM | POA: Diagnosis not present

## 2015-04-11 DIAGNOSIS — J45901 Unspecified asthma with (acute) exacerbation: Secondary | ICD-10-CM | POA: Diagnosis present

## 2015-04-11 DIAGNOSIS — Z9989 Dependence on other enabling machines and devices: Secondary | ICD-10-CM

## 2015-04-11 DIAGNOSIS — F3163 Bipolar disorder, current episode mixed, severe, without psychotic features: Secondary | ICD-10-CM | POA: Diagnosis not present

## 2015-04-11 DIAGNOSIS — T402X4D Poisoning by other opioids, undetermined, subsequent encounter: Secondary | ICD-10-CM | POA: Diagnosis not present

## 2015-04-11 DIAGNOSIS — Z765 Malingerer [conscious simulation]: Principal | ICD-10-CM

## 2015-04-11 DIAGNOSIS — G931 Anoxic brain damage, not elsewhere classified: Secondary | ICD-10-CM

## 2015-04-11 DIAGNOSIS — R410 Disorientation, unspecified: Secondary | ICD-10-CM | POA: Diagnosis not present

## 2015-04-11 DIAGNOSIS — Z7982 Long term (current) use of aspirin: Secondary | ICD-10-CM | POA: Diagnosis not present

## 2015-04-11 DIAGNOSIS — M549 Dorsalgia, unspecified: Secondary | ICD-10-CM

## 2015-04-11 DIAGNOSIS — I6782 Cerebral ischemia: Secondary | ICD-10-CM

## 2015-04-11 DIAGNOSIS — F1120 Opioid dependence, uncomplicated: Secondary | ICD-10-CM

## 2015-04-11 DIAGNOSIS — F1390 Sedative, hypnotic, or anxiolytic use, unspecified, uncomplicated: Secondary | ICD-10-CM | POA: Diagnosis not present

## 2015-04-11 DIAGNOSIS — T50901A Poisoning by unspecified drugs, medicaments and biological substances, accidental (unintentional), initial encounter: Secondary | ICD-10-CM | POA: Diagnosis present

## 2015-04-11 DIAGNOSIS — I10 Essential (primary) hypertension: Secondary | ICD-10-CM | POA: Diagnosis present

## 2015-04-11 DIAGNOSIS — F909 Attention-deficit hyperactivity disorder, unspecified type: Secondary | ICD-10-CM | POA: Diagnosis present

## 2015-04-11 DIAGNOSIS — G4733 Obstructive sleep apnea (adult) (pediatric): Secondary | ICD-10-CM | POA: Diagnosis present

## 2015-04-11 DIAGNOSIS — F602 Antisocial personality disorder: Secondary | ICD-10-CM

## 2015-04-11 DIAGNOSIS — F603 Borderline personality disorder: Secondary | ICD-10-CM | POA: Diagnosis present

## 2015-04-11 DIAGNOSIS — T402X4A Poisoning by other opioids, undetermined, initial encounter: Secondary | ICD-10-CM

## 2015-04-11 DIAGNOSIS — J4530 Mild persistent asthma, uncomplicated: Secondary | ICD-10-CM | POA: Diagnosis not present

## 2015-04-11 DIAGNOSIS — R4182 Altered mental status, unspecified: Secondary | ICD-10-CM | POA: Insufficient documentation

## 2015-04-11 DIAGNOSIS — T402X1A Poisoning by other opioids, accidental (unintentional), initial encounter: Principal | ICD-10-CM | POA: Diagnosis present

## 2015-04-11 DIAGNOSIS — Z6841 Body Mass Index (BMI) 40.0 and over, adult: Secondary | ICD-10-CM | POA: Diagnosis not present

## 2015-04-11 DIAGNOSIS — Z23 Encounter for immunization: Secondary | ICD-10-CM | POA: Diagnosis not present

## 2015-04-11 DIAGNOSIS — T402X1D Poisoning by other opioids, accidental (unintentional), subsequent encounter: Secondary | ICD-10-CM | POA: Diagnosis not present

## 2015-04-11 DIAGNOSIS — Z8673 Personal history of transient ischemic attack (TIA), and cerebral infarction without residual deficits: Secondary | ICD-10-CM

## 2015-04-11 DIAGNOSIS — F17210 Nicotine dependence, cigarettes, uncomplicated: Secondary | ICD-10-CM | POA: Diagnosis present

## 2015-04-11 DIAGNOSIS — Y92009 Unspecified place in unspecified non-institutional (private) residence as the place of occurrence of the external cause: Secondary | ICD-10-CM

## 2015-04-11 DIAGNOSIS — F1110 Opioid abuse, uncomplicated: Secondary | ICD-10-CM | POA: Diagnosis present

## 2015-04-11 DIAGNOSIS — R400 Somnolence: Secondary | ICD-10-CM | POA: Diagnosis not present

## 2015-04-11 DIAGNOSIS — F17200 Nicotine dependence, unspecified, uncomplicated: Secondary | ICD-10-CM | POA: Diagnosis not present

## 2015-04-11 DIAGNOSIS — F1320 Sedative, hypnotic or anxiolytic dependence, uncomplicated: Secondary | ICD-10-CM

## 2015-04-11 LAB — COMPREHENSIVE METABOLIC PANEL
ALBUMIN: 4.3 g/dL (ref 3.5–5.0)
ALT: 89 U/L — ABNORMAL HIGH (ref 17–63)
ANION GAP: 12 (ref 5–15)
AST: 67 U/L — AB (ref 15–41)
Alkaline Phosphatase: 66 U/L (ref 38–126)
BUN: 16 mg/dL (ref 6–20)
CHLORIDE: 107 mmol/L (ref 101–111)
CO2: 24 mmol/L (ref 22–32)
Calcium: 9.2 mg/dL (ref 8.9–10.3)
Creatinine, Ser: 1.02 mg/dL (ref 0.61–1.24)
GFR calc Af Amer: >60 mL/min (ref >60)
GFR calc non Af Amer: >60 mL/min (ref >60)
GLUCOSE: 220 mg/dL — AB (ref 65–99)
POTASSIUM: 4.3 mmol/L (ref 3.5–5.1)
Sodium: 143 mmol/L (ref 135–145)
Total Bilirubin: 0.8 mg/dL (ref 0.3–1.2)
Total Protein: 8.1 g/dL (ref 6.5–8.1)

## 2015-04-11 LAB — CBC WITH DIFFERENTIAL/PLATELET
BASOS ABS: 0 10*3/uL (ref 0.0–0.1)
Basophils Relative: 0 %
EOS ABS: 0.5 10*3/uL (ref 0.0–0.7)
Eosinophils Relative: 3 %
HCT: 46.7 % (ref 39.0–52.0)
Hemoglobin: 15.3 g/dL (ref 13.0–17.0)
LYMPHS ABS: 6 10*3/uL — AB (ref 0.7–4.0)
Lymphocytes Relative: 39 %
MCH: 30.9 pg (ref 26.0–34.0)
MCHC: 32.8 g/dL (ref 30.0–36.0)
MCV: 94.3 fL (ref 78.0–100.0)
MONO ABS: 1.1 10*3/uL — AB (ref 0.1–1.0)
Monocytes Relative: 7 %
Neutro Abs: 7.8 10*3/uL — ABNORMAL HIGH (ref 1.7–7.7)
Neutrophils Relative %: 51 %
PLATELETS: 328 10*3/uL (ref 150–400)
RBC: 4.95 MIL/uL (ref 4.22–5.81)
RDW: 12.6 % (ref 11.5–15.5)
WBC: 15.4 10*3/uL — AB (ref 4.0–10.5)

## 2015-04-11 LAB — BLOOD GAS, ARTERIAL
Acid-base deficit: 3.5 mmol/L — ABNORMAL HIGH (ref 0.0–2.0)
BICARBONATE: 23.4 meq/L (ref 20.0–24.0)
DRAWN BY: 276051
FIO2: 1
O2 SAT: 99.6 %
PH ART: 7.28 — AB (ref 7.350–7.450)
Patient temperature: 98.6
TCO2: 21 mmol/L (ref 0–100)
pCO2 arterial: 51.3 mmHg — ABNORMAL HIGH (ref 35.0–45.0)
pO2, Arterial: 363 mmHg — ABNORMAL HIGH (ref 80.0–100.0)

## 2015-04-11 LAB — ACETAMINOPHEN LEVEL

## 2015-04-11 LAB — SALICYLATE LEVEL

## 2015-04-11 LAB — ETHANOL

## 2015-04-11 MED ORDER — NALOXONE HCL 0.4 MG/ML IJ SOLN
INTRAMUSCULAR | Status: AC
  Administered 2015-04-11: 0.4 mg/mL
  Filled 2015-04-11: qty 1

## 2015-04-11 MED ORDER — ALBUTEROL SULFATE HFA 108 (90 BASE) MCG/ACT IN AERS: 2.0000 | INHALATION_SPRAY | Freq: Four times a day (QID) | RESPIRATORY_TRACT | Status: DC | PRN

## 2015-04-11 MED ORDER — ASPIRIN 325 MG PO TABS: 325.0000 mg | ORAL_TABLET | Freq: Every day | ORAL | Status: DC

## 2015-04-11 MED ORDER — HYDRALAZINE HCL 20 MG/ML IJ SOLN: 5.0000 mg | INTRAMUSCULAR | Status: DC | PRN

## 2015-04-11 MED ORDER — ALUM & MAG HYDROXIDE-SIMETH 200-200-20 MG/5ML PO SUSP: 30.0000 mL | Freq: Four times a day (QID) | ORAL | Status: DC | PRN

## 2015-04-11 MED ORDER — HYDROXYZINE PAMOATE 50 MG PO CAPS: 50.0000 mg | ORAL_CAPSULE | Freq: Three times a day (TID) | ORAL | Status: DC | PRN

## 2015-04-11 MED ORDER — PANTOPRAZOLE SODIUM 40 MG IV SOLR
40.0000 mg | INTRAVENOUS | Status: DC
  Administered 2015-04-12: 40 mg via INTRAVENOUS
  Filled 2015-04-11 (×2): qty 40

## 2015-04-11 MED ORDER — OMEPRAZOLE 20 MG PO CPDR: 20.0000 mg | DELAYED_RELEASE_CAPSULE | Freq: Every day | ORAL | Status: DC

## 2015-04-11 MED ORDER — PRAVASTATIN SODIUM 40 MG PO TABS: 40.0000 mg | ORAL_TABLET | Freq: Every day | ORAL | Status: DC

## 2015-04-11 MED ORDER — INSULIN ASPART 100 UNIT/ML ~~LOC~~ SOLN
0.0000 [IU] | SUBCUTANEOUS | Status: DC
  Administered 2015-04-12 (×3): 1 [IU] via SUBCUTANEOUS

## 2015-04-11 MED ORDER — LINAGLIPTIN 5 MG PO TABS: 5.0000 mg | ORAL_TABLET | Freq: Every day | ORAL | Status: DC

## 2015-04-11 MED ORDER — NALOXONE HCL 2 MG/2ML IJ SOSY
2.0000 mg | PREFILLED_SYRINGE | Freq: Once | INTRAMUSCULAR | Status: AC
  Administered 2015-04-11: 2 mg via INTRAVENOUS
  Filled 2015-04-11: qty 2

## 2015-04-11 MED ORDER — FUROSEMIDE 20 MG PO TABS: 20.0000 mg | ORAL_TABLET | Freq: Every day | ORAL | Status: DC

## 2015-04-11 MED ORDER — SODIUM CHLORIDE 0.9 % IV BOLUS (SEPSIS)
1000.0000 mL | Freq: Once | INTRAVENOUS | Status: AC
  Administered 2015-04-11: 1000 mL via INTRAVENOUS

## 2015-04-11 MED ORDER — ENOXAPARIN SODIUM 80 MG/0.8ML ~~LOC~~ SOLN
80.0000 mg | Freq: Every day | SUBCUTANEOUS | Status: DC
  Administered 2015-04-12: 80 mg via SUBCUTANEOUS
  Filled 2015-04-11 (×3): qty 0.8

## 2015-04-11 MED ORDER — TRAZODONE HCL 50 MG PO TABS: 150.0000 mg | ORAL_TABLET | Freq: Every day | ORAL | Status: DC

## 2015-04-11 MED ORDER — SODIUM CHLORIDE 0.9 % IV SOLN
INTRAVENOUS | Status: DC
  Administered 2015-04-12: via INTRAVENOUS

## 2015-04-11 MED ORDER — LISINOPRIL 40 MG PO TABS: 40.0000 mg | ORAL_TABLET | Freq: Two times a day (BID) | ORAL | Status: DC

## 2015-04-11 MED ORDER — METFORMIN HCL 500 MG PO TABS: 500.0000 mg | ORAL_TABLET | Freq: Two times a day (BID) | ORAL | Status: DC

## 2015-04-11 MED ORDER — NALOXONE HCL 0.4 MG/ML IJ SOLN: 0.2000 mg | INTRAMUSCULAR | Status: DC | PRN

## 2015-04-11 MED ORDER — NALOXONE HCL 2 MG/2ML IJ SOSY
2.5000 mg/h | PREFILLED_SYRINGE | INTRAVENOUS | Status: DC
  Administered 2015-04-11: 2.5 mg/h via INTRAVENOUS
  Filled 2015-04-11: qty 4

## 2015-04-11 MED ORDER — SODIUM CHLORIDE 0.9 % IV SOLN
INTRAVENOUS | Status: DC
  Administered 2015-04-11: 1000 mL via INTRAVENOUS

## 2015-04-11 MED ORDER — ONDANSETRON HCL 4 MG/2ML IJ SOLN
INTRAMUSCULAR | Status: AC
  Filled 2015-04-11: qty 2

## 2015-04-11 MED ORDER — GABAPENTIN 400 MG PO CAPS: 800.0000 mg | ORAL_CAPSULE | Freq: Three times a day (TID) | ORAL | Status: DC

## 2015-04-11 MED ORDER — ONDANSETRON HCL 4 MG/2ML IJ SOLN
4.0000 mg | Freq: Once | INTRAMUSCULAR | Status: AC
  Administered 2015-04-11: 4 mg via INTRAVENOUS

## 2015-04-11 MED ORDER — NALOXONE HCL 2 MG/2ML IJ SOSY
PREFILLED_SYRINGE | INTRAMUSCULAR | Status: AC
  Filled 2015-04-11: qty 2

## 2015-04-11 MED ORDER — NALOXONE HCL 2 MG/2ML IJ SOSY
PREFILLED_SYRINGE | INTRAMUSCULAR | Status: AC
  Administered 2015-04-11: 1.5 mg/mL
  Filled 2015-04-11: qty 2

## 2015-04-11 NOTE — Tx Team (Signed)
Interdisciplinary Treatment Plan Update (Adult)  Date:  04/11/2015 Time Reviewed:  10:36 AM  Progress in Treatment: Attending groups: Yes. Participating in groups:  Yes. Taking medication as prescribed:  Yes. Tolerating medication:  Yes. Family/Significant othe contact made:  No, will contact:  pt refused Patient understands diagnosis:  No. and As evidenced by:  limited insight  Discussing patient identified problems/goals with staff:  Yes. Medical problems stabilized or resolved:  Yes. Denies suicidal/homicidal ideation: Yes. Issues/concerns per patient self-inventory:  Yes. Other:  New problem(s) identified: No, Describe:  NA  Discharge Plan or Barriers: Pt plans to return home and follow up with outpatient.    Reason for Continuation of Hospitalization: Depression Medication stabilization Suicidal ideation  Comments:This patient is a 40 year old single Caucasian male from [place]. This patient was transferred directly to our behavioral health unit from the behavioral health unit in [place]. The patient was admitted there on February 8 after he presented to the emergency room reporting suicidality and is stating that he had cut his wrists. Once admitted the staff that they realized that the patient's girlfriend was a patient there and therefore the patient was discharged to [place] on February 19. As soon as the patient came to our unit he signed the 72 hour discharged request form. He reported to the social worker that he was not suicidal and was not suicidal prior to going to [place]. Patient stated that he just wanted to be admitted there in order to see his girlfriend. Staff suspected patient attempted to be admitted to behavioral health in order to avoid a court. He was a scheduled for a court appearance on February 8. This patient has a extensive legal history. On February 3 12 he was in jail the patient swallowed "6 AA batteries". He was brought in to  [facility] emergency department there he stated that he had done so because he had received the news that his son was killed in a car accident. This information could not be confirmed. Then the patient reported only swallowing batteries and the stated that he had punctured the batteries prior to swallowing them. Per x-ray patient only had one battery. Patient has successfully passed his battery in his last abdominal x-ray was negative. On February the 7 he returned to the emergency department in [facility] reporting left-sided weakness. Patient was brought in by his son who found him drowsy in the home. They was evidence of recent IV use. Patient responded to Narcan. He was evaluated by neurology, had a negative MRI and therefore discharged from the emergency department. Per their notes looks like he had had this presentation before and his symptoms were determined not to be neurological. "Medical staff noticed multiple needle stick sites and bruising to pt's arms at that time. He was also recently dx with Hep C." Urine toxicology screen on February 4 was positive for benzodiazepines on February 7 no urine toxicology was completed, on February 8 he was positive for benzodiazepines and opiates.Per chart review patient has history of opiate dependence, IV drug use, and benzodiazepine abuse disorder and has history of med seeking behaviors. Patient smokes about one pack of cigarettes per day.Today during the interview once again patient tells me he was not suicidal on February 8. He tells me the statements about cutting his wrists and being suicidal were false. His intention was to be admitted in order to see his girlfriend whom he knew it was at the behavioral health unit in [place]. Patient tells me he  is very happy and he has no desire or intention on harming himself. He denied homicidality, auditory or visual hallucinations. Patient only issue is wanting to have some medications for anxiety. He agreed with  a trial of Vistaril. Pt has a lengthy legal record and has incurred charges for making threats, selling schedule II drugs, possessing schedule II drugs, simple assault, B&E, and driving with a revoked license. He has a hx of multiple charges for larceny, possessing and receiving stolen goods, and forgery as well. He says his next court date is in "several months".  Estimated length of stay: Pt will d/c today   New goal(s):NA  Review of initial/current patient goals per problem list:   1.  Goal(s): Patient will participate in aftercare plan * Met: 04/11/2015  * Target date: at discharge * As evidenced by: Patient will participate within aftercare plan AEB aftercare provider and housing plan at discharge being identified.   2.  Goal (s): Patient will exhibit decreased depressive symptoms and suicidal ideations. * Met: 04/11/2015  *  Target date: at discharge * As evidenced by: Patient will utilize self rating of depression at 3 or below and demonstrate decreased signs of depression or be deemed stable for discharge by MD.   3.  Goal(s): Patient will demonstrate decreased signs and symptoms of anxiety. * Met: 04/11/2015  * Target date: at discharge * As evidenced by: Patient will utilize self rating of anxiety at 3 or below and demonstrated decreased signs of anxiety, or be deemed stable for discharge by MD    Attendees: Patient:  Bryan Gallegos 2/10/201710:36 AM  Family:   2/10/201710:36 AM  Physician:  Dr. Jerilee Hoh   2/10/201710:36 AM  Nursing:   Junita Push, RN  2/10/201710:36 AM  Case Manager:   2/10/201710:36 AM  Counselor:   2/10/201710:36 AM  Other:  Wray Kearns, LCSWA 2/10/201710:36 AM  Other:  Everitt Amber, LRT  2/10/201710:36 AM  Other:   2/10/201710:36 AM  Other:  2/10/201710:36 AM  Other:  2/10/201710:36 AM  Other:  2/10/201710:36 AM  Other:  2/10/201710:36 AM  Other:  2/10/201710:36 AM  Other:  2/10/201710:36 AM  Other:   2/10/201710:36 AM   Scribe for Treatment Team:    Wray Kearns, MSW, LCSWA  04/11/2015, 10:36 AM

## 2015-04-11 NOTE — BHH Counselor (Signed)
Pt was originally admitted to [facility] but was transferred to [facility] due to wife current admission on the unit. Pt reported he went to the ED after being released from jail to be admitted to the hospital with the goal of seeing his wife. Pt is now requesting to be discharged. He denies SI/HI and AVH. PSA was not completed because patient was discharged within 24 hours of admission. Pt plans to return home and follow up with outpatient.    Daisy Floro Phelix Fudala MSW, LCSWA  04/11/2015 10:07 AM
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
## 2015-04-11 NOTE — ED Notes (Signed)
Bed: RESA Expected date:  Expected time:  Means of arrival:  Comments: EMS- 40 yo overdose- unresponsive

## 2015-04-11 NOTE — Plan of Care (Signed)
Problem: Ineffective individual coping Goal: STG: Patient will remain free from self harm Outcome: Progressing Pt has remained free from self harm     

## 2015-04-11 NOTE — ED Notes (Signed)
Bilateral wrist restraints applied by Molly Maduro, tech.

## 2015-04-11 NOTE — ED Notes (Signed)
Pt's son stated "he was @ my house, he went outside for about 5 minutes, came back in and collapsed.  He used to be a heroin user."  At this comment the pt began shaking his head.
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
## 2015-04-11 NOTE — [facility] Group Notes (Signed)
[facility] Group Notes:  (Nursing/MHT/Case Management/Adjunct)  Date:  04/11/2015  Time:  12:17 PM  Type of Therapy:  Group Therapy  Participation Level:  Did Not Attend  Miana Politte De'Chelle Deanna Boehlke 04/11/2015, 12:17 PM

## 2015-04-11 NOTE — [facility] Suicide Risk Assessment (Signed)
[facility] Discharge Suicide Risk Assessment   Principal Problem: Malingering Discharge Diagnoses:  Patient Active Problem List   Diagnosis Date Noted  . 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 Antisocial personality disorder [F60.2] 04/11/2015  . Opioid use disorder, moderate, dependence ([facility]) [F11.20] 04/10/2015  . 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 HTN (hypertension) [I10] 04/10/2015  . Tobacco use disorder [F17.200] 04/10/2015  . Drug-seeking behavior [F19.10] 03/14/2014  . Chronic back pain greater than 3 months duration [M54.9, G89.29] 01/28/2014  . Gastroesophageal reflux disease with esophagitis [K21.0] 01/28/2014  . Essential hypertension, benign [I10] 01/28/2014  . Asthma, mild persistent [J45.30] 01/28/2014  . Diabetes mellitus type II, controlled ([facility]) [E11.9] 01/28/2014  . Morbid obesity ([facility]) [E66.01] 01/28/2014  . TIA (transient ischemic attack) [G45.9] 09/02/2013  . HLD (hyperlipidemia) [E78.5] 08/06/2013  . OSA (obstructive sleep apnea) [G47.33] 08/06/2013    Total Time spent with patient: 1 hour    Psychiatric Specialty Exam: ROS                                                         Mental Status Per Nursing Assessment::   On Admission:     Demographic Factors:  Male, Caucasian, Low socioeconomic status and Unemployed  Loss Factors: Legal issues and Financial problems/change in socioeconomic status  Historical Factors: Prior suicide attempts and Impulsivity  Risk Reduction Factors:   Positive social support  Continued Clinical Symptoms:  Alcohol/Substance Abuse/Dependencies  Cognitive Features That Contribute To Risk:  None    Suicide Risk:  Minimal: No identifiable suicidal ideation.  Patients presenting with no risk factors but with morbid ruminations; may be classified as minimal risk based on the severity of the depressive symptoms   Bryan Footman, MD 04/11/2015,  10:05 AM

## 2015-04-11 NOTE — ED Notes (Signed)
Pt is yelling "water, where am I at."

## 2015-04-11 NOTE — ED Notes (Signed)
Patient transported to CT 

## 2015-04-11 NOTE — ED Notes (Signed)
Per [facility]- Witness found pt unresponsive. EMS gave  Narcan intranasal without recovery. Ventilation assisted with BVM and nasal trumpet. Transport to nearest ED. EDP to assist upon arrival to [facility].  EDP [place] present. IV established upon arrival

## 2015-04-11 NOTE — [facility] Suicide Risk Assessment (Signed)
[facility] INPATIENT:  Family/Significant Other Suicide Prevention Education  Suicide Prevention Education:  Patient Refusal for Family/Significant Other Suicide Prevention Education: The patient Bryan Gallegos has refused to provide written consent for family/significant other to be provided Family/Significant Other Suicide Prevention Education during admission and/or prior to discharge.  Physician notified.  SPE completed with pt and he was encouraged to share information provided in SPI pamphlet with support network, ask questions, and talk about any concerns relating to SPE. Pt denies SI/HI/AVH and verbalized understanding of information presented.    Aisa Schoeppner L Co[place]ise Dinneen MSW, LCSWA  04/11/2015, 10:09 AM

## 2015-04-11 NOTE — Progress Notes (Signed)
Patient denies SI/HI, denies A/V hallucinations. Patient verbalizes understanding of discharge instructions, follow up care and prescriptions. Patient given all belongings from  locker. Patient escorted out by staff, transported by family. 

## 2015-04-11 NOTE — ED Provider Notes (Signed)
CSN: [id number]     Arrival date & time 04/11/15  1612 History   First MD Initiated Contact with Patient 04/11/15 1626     Chief Complaint  Patient presents with  . Drug Overdose     (Consider location/radiation/quality/duration/timing/severity/associated sxs/prior Treatment) Patient is a 40 y.o. male presenting with altered mental status. The history is provided by a relative and the EMS personnel.  Altered Mental Status Presenting symptoms: unresponsiveness   Presenting symptoms comment:  Apnea Severity:  Severe Most recent episode:  Today Episode history:  Continuous Duration:  20 minutes Timing:  Constant Progression:  Improving Chronicity:  New Context: drug use (suspected heroin overdose)   Associated symptoms comment:  Recently endorsing suicidal ideation to his son who was with him during this episode   Past Medical History  Diagnosis Date  . Heart valve disorder     s/p echocardiogram  . Hypertension   . Asthma   . Diabetes mellitus   . Hyperlipidemia   . Anaphylactic reaction   . Bipolar disorder (HCC)   . Adult ADHD   . Morbidly obese (HCC)   . Transient cerebral ischemia     Unknown  . OSA (obstructive sleep apnea)   . Anxiety   . Complication of anesthesia     Per patient difficult intubation;  . Difficult intubation     Per patient   Past Surgical History  Procedure Laterality Date  . Carpal tunnel release    . Nose surgery    . Wisdom tooth extraction    . Tooth extraction    . Esophagogastroduodenoscopy N/A 04/05/2015    Procedure: ESOPHAGOGASTRODUODENOSCOPY (EGD);  Surgeon: Malissa Hippo, MD;  Location: [facility];  Service: Endoscopy;  Laterality: N/A;   Family History  Problem Relation Age of Onset  . CAD Mother     Living  . Diabetes Mellitus II Mother   . Stroke Mother   . Hypertension Mother   . Congestive Heart Failure Mother   . Kidney disease Mother   . Fibromyalgia Mother   . Thyroid disease Mother   . Hyperlipidemia  Mother   . Liver disease Mother   . Alcoholism Father 15    Deceased  . Arthritis Maternal Grandmother   . Congestive Heart Failure Maternal Grandmother   . Hypertension Maternal Grandmother   . Lung cancer Maternal Grandfather   . Colon cancer Maternal Aunt   . Stomach cancer Maternal Aunt   . Heart disease Other     Paternal & Maternal  . Hypertension Other     Paternal & Maternal  . Hypertension Brother     x3  . Hypertension Sister     #1  . Bipolar disorder Sister     #1  . ADD / ADHD Son     x3  . Bipolar disorder Son     x3  . Asperger's syndrome Son    Social History  Substance Use Topics  . Smoking status: Current Every Day Smoker -- 1.00 packs/day for 23 years    Types: Cigarettes  . Smokeless tobacco: Never Used  . Alcohol Use: 0.0 oz/week    0 Standard drinks or equivalent per week     Comment: drank 7 beers on Thanksgiving Day 2016, last drank one yr before that    Review of Systems  Psychiatric/Behavioral: Positive for suicidal ideas.  All other systems reviewed and are negative.     Allergies  Atenolol; Other; and Tylenol  Home Medications  Prior to Admission medications   Medication Sig Start Date End Date Taking? Authorizing Provider  albuterol (PROVENTIL HFA;VENTOLIN HFA) 108 (90 Base) MCG/ACT inhaler Inhale 2 puffs into the lungs every 6 (six) hours as needed for wheezing or shortness of breath. Patient not taking: Reported on 04/11/2015 04/11/15   Jimmy Footman, MD  aspirin 325 MG tablet Take 1 tablet (325 mg total) by mouth daily. Patient not taking: Reported on 04/11/2015 04/11/15   Jimmy Footman, MD  furosemide (LASIX) 20 MG tablet Take 1 tablet (20 mg total) by mouth daily. Patient not taking: Reported on 04/11/2015 04/11/15   Jimmy Footman, MD  gabapentin (NEURONTIN) 400 MG capsule Take 2 capsules (800 mg total) by mouth 3 (three) times daily. Patient not taking: Reported on 04/11/2015 04/11/15   Jimmy Footman, MD  hydrOXYzine (VISTARIL) 50 MG capsule Take 1 capsule (50 mg total) by mouth 3 (three) times daily as needed for anxiety. Patient not taking: Reported on 04/11/2015 04/11/15   Jimmy Footman, MD  linagliptin (TRADJENTA) 5 MG TABS tablet Take 1 tablet (5 mg total) by mouth daily before breakfast. Patient not taking: Reported on 04/11/2015 04/11/15   Jimmy Footman, MD  lisinopril (PRINIVIL,ZESTRIL) 40 MG tablet Take 1 tablet (40 mg total) by mouth 2 (two) times daily. Patient not taking: Reported on 04/11/2015 04/11/15   Jimmy Footman, MD  metFORMIN (GLUCOPHAGE) 500 MG tablet Take 1 tablet (500 mg total) by mouth 2 (two) times daily with a meal. Patient not taking: Reported on 04/11/2015 04/11/15   Jimmy Footman, MD  omeprazole (PRILOSEC) 20 MG capsule Take 1 capsule (20 mg total) by mouth daily. Patient not taking: Reported on 04/11/2015 04/11/15   Jimmy Footman, MD  pravastatin (PRAVACHOL) 40 MG tablet Take 1 tablet (40 mg total) by mouth at bedtime. Patient not taking: Reported on 04/11/2015 04/11/15   Jimmy Footman, MD   BP 139/90 mmHg  Pulse 114  Resp 20  SpO2 100% Physical Exam  Constitutional: He appears well-developed and well-nourished. He appears lethargic. He appears ill. No distress. Backboard in place.  Morbidly obese  HENT:  Head: Normocephalic and atraumatic. Head is without abrasion and without contusion.  Eyes: Conjunctivae are normal.  Neck: Neck supple. No tracheal deviation present.  Cardiovascular: Regular rhythm.  Tachycardia present.   Pulmonary/Chest: Bradypnea noted. He has no decreased breath sounds.  Poor inspiratory effort  Abdominal: Soft. He exhibits no distension. There is no tenderness.  Neurological: He appears lethargic. GCS eye subscore is 2. GCS verbal subscore is 2. GCS motor subscore is 3.  Disconjugate gaze  Skin: Skin is warm and dry. No rash noted.  Psychiatric: His  speech is slurred. He is noncommunicative.    ED Course  Procedures (including critical care time)  CRITICAL CARE Performed by: Lyndal Pulley Total critical care time: 75 minutes Critical care time was exclusive of separately billable procedures and treating other patients. Critical care was necessary to treat or prevent imminent or life-threatening deterioration. Critical care was time spent personally by me on the following activities: development of treatment plan with patient and/or surrogate as well as nursing, discussions with consultants, evaluation of patient's response to treatment, examination of patient, obtaining history from patient or surrogate, ordering and performing treatments and interventions, ordering and review of laboratory studies, ordering and review of radiographic studies, pulse oximetry and re-evaluation of patient's condition.   Labs Review Labs Reviewed  ACETAMINOPHEN LEVEL - Abnormal; Notable for the following:    Acetaminophen (Tylenol), Serum <10 (*)  All other components within normal limits  CBC WITH DIFFERENTIAL/PLATELET - Abnormal; Notable for the following:    WBC 15.4 (*)    Neutro Abs 7.8 (*)    Lymphs Abs 6.0 (*)    Monocytes Absolute 1.1 (*)    All other components within normal limits  COMPREHENSIVE METABOLIC PANEL - Abnormal; Notable for the following:    Glucose, Bld 220 (*)    AST 67 (*)    ALT 89 (*)    All other components within normal limits  BLOOD GAS, ARTERIAL - Abnormal; Notable for the following:    pH, Arterial 7.280 (*)    pCO2 arterial 51.3 (*)    pO2, Arterial 363 (*)    Acid-base deficit 3.5 (*)    All other components within normal limits  ETHANOL  SALICYLATE LEVEL  PATHOLOGIST SMEAR REVIEW  URINALYSIS, ROUTINE W REFLEX MICROSCOPIC (NOT AT [facility])  URINE RAPID DRUG SCREEN, HOSP PERFORMED    Imaging Review Ct Head Wo Contrast  04/11/2015  CLINICAL DATA:  Altered mental status, suspected OD, apneic period. EXAM: CT  HEAD WITHOUT CONTRAST TECHNIQUE: Contiguous axial images were obtained from the base of the skull through the vertex without intravenous contrast. COMPARISON:  Head CT dated 08/18/2014 and brain MRI dated 04/08/2015. FINDINGS: Ventricles are normal in size and configuration. There is mild generalized brain atrophy with commensurate dilatation of the sulci. There is no mass, hemorrhage, edema or other evidence of acute parenchymal abnormality. No extra-axial hemorrhage. No osseous fracture or dislocation. Paranasal sinuses and mastoid air cells are clear. Superficial soft tissues are unremarkable. IMPRESSION: Negative head CT.  No intracranial mass, hemorrhage or edema. Electronically Signed   By: Bary Richard M.D.   On: 04/11/2015 18:12   Dg Chest Port 1 View  04/11/2015  CLINICAL DATA:  40 year old found unresponsive earlier today. EXAM: PORTABLE CHEST 1 VIEW COMPARISON:  04/09/2015 and earlier. FINDINGS: Right costophrenic angle excluded from the image. Near expiratory image which accounts for atelectasis in the lung bases and accentuates the heart size. Lungs otherwise clear. IMPRESSION: Near expiratory image accounts for bibasilar atelectasis. Electronically Signed   By: Hulan Saas M.D.   On: 04/11/2015 17:29   I have personally reviewed and evaluated these images and lab results as part of my medical decision-making.   EKG Interpretation   Date/Time:  Friday April 11 2015 16:33:07 EST Ventricular Rate:  115 PR Interval:  155 QRS Duration: 96 QT Interval:  330 QTC Calculation: 456 R Axis:   77 Text Interpretation:  Sinus tachycardia Otherwise normal ECG Confirmed by  Benjamin Casanas MD, Neenah Canter ([id number]) on 04/11/2015 4:42:03 PM      MDM   Final diagnoses:  Altered mental status  Drug overdose, undetermined intent, initial encounter  Hypoxic brain injury [facility])    40 y.o. male presents with presumed drug overdose where he was with his son, went into a house and came out and then became  unresponsive in the car and apneic. He was breathing for the first 5 minutes after becoming unresponsive and then stopped breathing requiring rescue breaths from his son for 5 minutes prior to EMS arrival. EMS provided 3 mg of Narcan intranasally and the patient began breathing 8-10 times per minute spontaneously and groaning. On arrival his GCS was 7, the patient was given 2 mg of Narcan through his IV which changed his GCS to 10 and another 2 mg which brought him awake enough to clear his airway and localize/follow commands. He was started on  a Narcan infusion at 2.5 mg/h and continues to maintain his airway appropriately but did not resolve his mental status. He continues to be combative, have persistently slurred speech, anisocoria and have a nonfocal gaze abnormality. CT head is negative for acute intracranial bleed or injury currently but patient possibly sustained a hypoxic ischemic brain insult during this episode. He is maintaining his oxygenation on minimal supplemental oxygen by nasal cannula currently.  I discussed the case with neurology who recommended transferring the patient to [facility] for further evaluation. I discussed with the hospitalist and the stepdown unit at [facility] is unable to manage a Narcan infusion so he will require placement in the intensive care unit with critical care. Patient required frequent reassessment for changes in his airway status, titration of medications, and neuro checks in the ED as his admission was delayed because of staffing availability.  Due to lack of availability of ICU staffing patient was discontinued from Narcan drip to monitor. He maintained his airway for 1 hour off of the continuous Narcan infusion with no change in his mental status, continuing to slur words, continued to be confused and no change in his gaze. At this point it appears his deficits are more likely secondary to a co-ingestion or an anoxic brain injury than persistent opioid intoxication.  Discussed again with hospitalist service who will see that patient and admit to stepdown unit for further monitoring and neurology evaluation.   Lyndal Pulley, MD 04/11/15 2124

## 2015-04-11 NOTE — Progress Notes (Signed)
D: Observed pt interacting in day room. Patient alert and oriented x4. Patient denies SI/HI/AVH. Pt affect anxious and irritable. Pt denies feeling depressed and stated "I done what I done to get out of jail and see my wife." Pt rated depression 0/10 and anxiety 10/10. Pt only had trazodone scheduled this pm. Pt irritable and upset that no other night medications are scheduled. Despite irritability, pt jokes and laughs appropriately and has a generally pleasant demeanor. A: Offered active listening and support. Provided therapeutic communication. Called doctor about getting order for CPAP,as pt has sleep apnea, and called about night time medications. Administered scheduled medications. Encouraged pt to attend group and actively participe in care.  R: Pt remains pleasant and cooperative. Pt has CPAP in room with camera monitoring pt. Pt medication compliant. Will continue Q15 min. checks. Safety maintained.

## 2015-04-11 NOTE — ED Notes (Signed)
Pt. CBG 296, RN,Kelly made aware. CBG ordered by EDP, Allen,MD.

## 2015-04-11 NOTE — ED Notes (Signed)
Non-rebreather removed by Dr. Clydene Pugh.  Oxygen provided [place] @ 4L/[place]

## 2015-04-11 NOTE — Progress Notes (Signed)
  [facility] Adult Case Management Discharge Plan :  Will you be returning to the same living situation after discharge:  Yes,  Home At discharge, do you have transportation home?: Yes,  son Do you have the ability to pay for your medications: Yes,  Health department   Release of information consent forms completed and in the chart;  Patient's signature needed at discharge.  Patient to Follow up at: Follow-up Information    Call [facility] .   Why:  Please call the Pharmacy assistance program to schedule an appointment to determine eligibility.    Contact information:   [address]  [place] [address]  [phone]      Go to [facility].   Why:  Your hospital follow up appointment is on Tuesday, Feb. 14th at 9:00am. Please bring your ID, social security card, proof of income and any insurance information.   Contact information:   [address] [place], [place] [address] Phone: [phone] Fax: [phone]      Next level of care provider has access to [facility] Link:no  Safety Planning and Suicide Prevention discussed: Yes,  with patient   Have you used any form of tobacco in the last 30 days? (Cigarettes, Smokeless Tobacco, Cigars, and/or Pipes): Patient Refused Screening  Has patient been referred to the Quitline?: Patient refused referral  Patient has been referred for addiction treatment: Pt. refused referral  [employer] MSW, LCSWA  04/11/2015, 10:34 AM

## 2015-04-11 NOTE — ED Notes (Signed)
V.O. Received from Dr. Clydene Pugh for an add'l 2 mg Narcan

## 2015-04-11 NOTE — H&P (Signed)
[facility] Admission History and Physical       Bryan Gallegos ZOX:[id number] DOB: Apr 17, 1975 DOA: 04/11/2015  Referring physician: EDP PCP: No PCP Per Patient  Specialists:   Chief Complaint: [name]  HPI: Bryan Gallegos is a 40 y.o. male with a history of Heroin Abuse, HTN, DM2, Asthma, OSA and Morbid Obesity who was brought to the ED after he became unconscious.  His son is at the bedside and gives the history and reports that his father came home from drug rehab today and patient went to the hospital to visit his mother, and went home and after he went in the house shortly he came out of the house and passed out and stopped breathing.   The son reports calling EMS and giving respirations until EMS arrived.   The son reports that he was unconscious about 20 minutes.   The son suspects that his father overdosed on Heroin.  EMS administered intranasal Narcan x 3 without results, and in the ED he was administered Narcan  IV x 4 doses and then was placed on a Narcan drip.    He began to arouse, but had slurring of his speech and complaints of not being able to see.  A CT scan of the Head was performed and was negative for acute findings.   Of Note: the patient had an MRI of the Brain 2 days ago.   He was weaned from the IV Narcan drip and referred for admission.   Neurology was consulted by the EDP and requested that patient be transferred to [facility] for Neurology evaluation.      Review of Systems:  Constitutional: No Weight Loss, No Weight Gain, Night Sweats, Fevers, Chills, Dizziness, Light Headedness, Fatigue, or Generalized Weakness HEENT: No Headaches, Difficulty Swallowing,Tooth/Dental Problems,Sore Throat,  No Sneezing, Rhinitis, Ear Ache, Nasal Congestion, or Post Nasal Drip,  Cardio-vascular:  No Chest pain, Orthopnea, PND, Edema in Lower Extremities, Anasarca, Dizziness, Palpitations  Resp: No Dyspnea, No DOE, No Productive Cough, No Non-Productive Cough, No  Hemoptysis, No Wheezing.    GI: No Heartburn, Indigestion, Abdominal Pain, Nausea, Vomiting, Diarrhea, Constipation, Hematemesis, Hematochezia, Melena, Change in Bowel Habits,  Loss of Appetite  GU: No Dysuria, No Change in Color of Urine, No Urgency or Urinary Frequency, No Flank pain.  Musculoskeletal: No Joint Pain or Swelling, No Decreased Range of Motion, No Back Pain.  Neurologic:  +Syncope, No Seizures, Muscle Weakness, Paresthesia, +Vision Loss, No Diplopia, +Dysarthria. No Vertigo, No Difficulty Walking,  Skin: No Rash or Lesions. Psych: No Change in Mood or Affect, No Depression or Anxiety, No Memory loss, No Confusion, or Hallucinations   Past Medical History  Diagnosis Date  . Heart valve disorder     s/p echocardiogram  . Hypertension   . Asthma   . Diabetes mellitus   . Hyperlipidemia   . Anaphylactic reaction   . Bipolar disorder (HCC)   . Adult ADHD   . Morbidly obese (HCC)   . Transient cerebral ischemia     Unknown  . OSA (obstructive sleep apnea)   . Anxiety   . Complication of anesthesia     Per patient difficult intubation;  . Difficult intubation     Per patient     Past Surgical History  Procedure Laterality Date  . Carpal tunnel release    . Nose surgery    . Wisdom tooth extraction    . Tooth extraction    . Esophagogastroduodenoscopy N/A 04/05/2015  Procedure: ESOPHAGOGASTRODUODENOSCOPY (EGD);  Surgeon: Malissa Hippo, MD;  Location: [facility];  Service: Endoscopy;  Laterality: N/A;      Prior to Admission medications   Medication Sig Start Date End Date Taking? Authorizing Provider  albuterol (PROVENTIL HFA;VENTOLIN HFA) 108 (90 Base) MCG/ACT inhaler Inhale 2 puffs into the lungs every 6 (six) hours as needed for wheezing or shortness of breath. Patient not taking: Reported on 04/11/2015 04/11/15   Jimmy Footman, MD  aspirin 325 MG tablet Take 1 tablet (325 mg total) by mouth daily. Patient not taking: Reported on 04/11/2015  04/11/15   Jimmy Footman, MD  furosemide (LASIX) 20 MG tablet Take 1 tablet (20 mg total) by mouth daily. Patient not taking: Reported on 04/11/2015 04/11/15   Jimmy Footman, MD  gabapentin (NEURONTIN) 400 MG capsule Take 2 capsules (800 mg total) by mouth 3 (three) times daily. Patient not taking: Reported on 04/11/2015 04/11/15   Jimmy Footman, MD  hydrOXYzine (VISTARIL) 50 MG capsule Take 1 capsule (50 mg total) by mouth 3 (three) times daily as needed for anxiety. Patient not taking: Reported on 04/11/2015 04/11/15   Jimmy Footman, MD  linagliptin (TRADJENTA) 5 MG TABS tablet Take 1 tablet (5 mg total) by mouth daily before breakfast. Patient not taking: Reported on 04/11/2015 04/11/15   Jimmy Footman, MD  lisinopril (PRINIVIL,ZESTRIL) 40 MG tablet Take 1 tablet (40 mg total) by mouth 2 (two) times daily. Patient not taking: Reported on 04/11/2015 04/11/15   Jimmy Footman, MD  metFORMIN (GLUCOPHAGE) 500 MG tablet Take 1 tablet (500 mg total) by mouth 2 (two) times daily with a meal. Patient not taking: Reported on 04/11/2015 04/11/15   Jimmy Footman, MD  omeprazole (PRILOSEC) 20 MG capsule Take 1 capsule (20 mg total) by mouth daily. Patient not taking: Reported on 04/11/2015 04/11/15   Jimmy Footman, MD  pravastatin (PRAVACHOL) 40 MG tablet Take 1 tablet (40 mg total) by mouth at bedtime. Patient not taking: Reported on 04/11/2015 04/11/15   Jimmy Footman, MD     Allergies  Allergen Reactions  . Atenolol Anaphylaxis  . Other Anaphylaxis    "Prednisone like drugs"  . Tylenol [Acetaminophen] Other (See Comments)    GI Bleed    Social History:  reports that he has been smoking Cigarettes.  He has a 23 pack-year smoking history. He has never used smokeless tobacco. He reports that he drinks alcohol. He reports that he uses illicit drugs (Benzodiazepines and Heroin).    Family History  Problem  Relation Age of Onset  . CAD Mother     Living  . Diabetes Mellitus II Mother   . Stroke Mother   . Hypertension Mother   . Congestive Heart Failure Mother   . Kidney disease Mother   . Fibromyalgia Mother   . Thyroid disease Mother   . Hyperlipidemia Mother   . Liver disease Mother   . Alcoholism Father 80    Deceased  . Arthritis Maternal Grandmother   . Congestive Heart Failure Maternal Grandmother   . Hypertension Maternal Grandmother   . Lung cancer Maternal Grandfather   . Colon cancer Maternal Aunt   . Stomach cancer Maternal Aunt   . Heart disease Other     Paternal & Maternal  . Hypertension Other     Paternal & Maternal  . Hypertension Brother     x3  . Hypertension Sister     #1  . Bipolar disorder Sister     #1  . ADD /  ADHD Son     x3  . Bipolar disorder Son     x3  . Asperger's syndrome Son        Physical Exam:  GEN: Confused Morbidly Obese 40 y.o. Caucasian male examined and in no acute distress; cooperative with exam Filed Vitals:   04/11/15 1645 04/11/15 1700 04/11/15 1905 04/11/15 2037  BP: 136/113 139/90 171/147 144/95  Pulse: 111 114 112 164  Temp:    97.2 F (36.2 C)  TempSrc:    Rectal  Resp: 15 20 21 25   SpO2: 100% 100% 97% 100%   Blood pressure 144/95, pulse 164, temperature 97.2 F (36.2 C), temperature source Rectal, resp. rate 25, SpO2 100 %. PSYCH: He is alert and oriented x 2; does not appear anxious HEENT: Normocephalic and Atraumatic, Mucous membranes pink; PERRLA; EOM intact; Fundi:  Benign;  No scleral icterus, Nares: Patent, Oropharynx: Clear,    Neck:  FROM, No Cervical Lymphadenopathy nor Thyromegaly or Carotid Bruit; No JVD; Breasts:: Not examined CHEST WALL: No tenderness CHEST: Normal respiration, clear to auscultation bilaterally HEART: Regular rate and rhythm; no murmurs rubs or gallops BACK: No kyphosis or scoliosis; No CVA tenderness ABDOMEN: Positive Bowel Sounds, Obese, Soft Non-Tender, No Rebound or Guarding;  No Masses, No Organomegaly, +Pannus Rectal Exam: Not done EXTREMITIES: No Cyanosis, Clubbing, or Edema; No Ulcerations. Genitalia: not examined PULSES: 2+ and symmetric SKIN: Normal hydration no rash or ulceration CNS:  Alert and Oriented x 2, Moving All 4 Exts to Command, Understands Commands, Appropriate Verbal Responses, +Dysarthria, +Vision Loss Both Eyes Vascular: pulses palpable throughout    Labs on Admission:  Basic Metabolic Panel:  Recent Labs Lab 04/05/15 0225 04/08/15 1640 04/09/15 0359 04/11/15 1638  NA 140 138 135 143  K 4.3 3.9 4.0 4.3  CL 106 106 104 107  CO2 24 23 23 24   GLUCOSE 139* 203* 121* 220*  BUN 17 14 14 16   CREATININE 0.91 0.97 1.00 1.02  CALCIUM 9.4 8.8* 9.0 9.2   Liver Function Tests:  Recent Labs Lab 04/05/15 0225 04/08/15 1640 04/09/15 0359 04/11/15 1638  AST 53* 58* 67* 67*  ALT 76* 79* 86* 89*  ALKPHOS 71 63 68 66  BILITOT 1.4* 0.9 1.1 0.8  PROT 8.9* 7.7 7.7 8.1  ALBUMIN 4.3 4.1 4.1 4.3   No results for input(s): LIPASE, AMYLASE in the last 168 hours. No results for input(s): AMMONIA in the last 168 hours. CBC:  Recent Labs Lab 04/05/15 0225 04/08/15 1640 04/09/15 0525 04/11/15 1638  WBC 12.7* 10.8* 9.2 15.4*  NEUTROABS 7.7 6.5  --  7.8*  HGB 17.2* 15.4 15.3 15.3  HCT 50.2 45.6 45.6 46.7  MCV 91.8 91.4 92.7 94.3  PLT 246 226 215 328   Cardiac Enzymes: No results for input(s): CKTOTAL, CKMB, CKMBINDEX, TROPONINI in the last 168 hours.  BNP (last 3 results)  Recent Labs  04/01/15 0030  BNP 16.0    ProBNP (last 3 results) No results for input(s): PROBNP in the last 8760 hours.  CBG:  Recent Labs Lab 04/08/15 1552 04/09/15 2027 04/10/15 0621 04/10/15 1152 04/10/15 2043  GLUCAP 193* 130* 135* 120* 176*    Radiological Exams on Admission: Ct Head Wo Contrast  04/11/2015  CLINICAL DATA:  Altered mental status, suspected OD, apneic period. EXAM: CT HEAD WITHOUT CONTRAST TECHNIQUE: Contiguous axial images  were obtained from the base of the skull through the vertex without intravenous contrast. COMPARISON:  Head CT dated 08/18/2014 and brain MRI dated 04/08/2015. FINDINGS:  Ventricles are normal in size and configuration. There is mild generalized brain atrophy with commensurate dilatation of the sulci. There is no mass, hemorrhage, edema or other evidence of acute parenchymal abnormality. No extra-axial hemorrhage. No osseous fracture or dislocation. Paranasal sinuses and mastoid air cells are clear. Superficial soft tissues are unremarkable. IMPRESSION: Negative head CT.  No intracranial mass, hemorrhage or edema. Electronically Signed   By: Bary Richard M.D.   On: 04/11/2015 18:12   Dg Chest Port 1 View  04/11/2015  CLINICAL DATA:  40 year old found unresponsive earlier today. EXAM: PORTABLE CHEST 1 VIEW COMPARISON:  04/09/2015 and earlier. FINDINGS: Right costophrenic angle excluded from the image. Near expiratory image which accounts for atelectasis in the lung bases and accentuates the heart size. Lungs otherwise clear. IMPRESSION: Near expiratory image accounts for bibasilar atelectasis. Electronically Signed   By: Hulan Saas M.D.   On: 04/11/2015 17:29     EKG: Independently reviewed. Sinus Tachycardia rate = 115        Assessment/Plan:      40 y.o. male with  Principal Problem:    1.     Overdose/Opioid overdose    SDU Monitoring    Suicide Precautions, and 1:1 Sitter    PRN IV Narcan      Active Problems:   2.    Anoxic Injury    Neurology Consult        3.    Essential hypertension, benign    Monitor BPs    PRN IV Hydralazine while NPO    On Lasix and Lisinopril Rx  regularly     4.    Asthma, mild persistent    PRN Albuterol Nebs      5.   Diabetes mellitus type II, controlled (HCC)    Hold Metformin Rx    SSi coverage PRN    Check HbA1C      6.   OSA (obstructive sleep apnea)    BIPAP qhs PRN    Monitor O2 sats      7.   Chronic back pain greater than 3  months duration    No Narcotics    PRN IV Toradol      8.   Morbid obesity (HCC)    Needs Weight Loss      9.   Tobacco use disorder    Nicotine Patch     10.  DVT Prophylaxis    Lovenox     Code Status:     FULL CODE     Family Communication:   Son at Bedside   Disposition Plan:    Inpatient Status with Expected LOS Stay 2-3 days   Time spent:  58 Minutes      Ron Parker [facility] Pager [phone]   If 7AM -7PM Please Contact the Day Rounding Team MD for [facility]  If 7PM-7AM, Please Contact Night-Floor Coverage  [email or web address] Password [email or web address] 04/11/2015, 9:40 PM     ADDENDUM:   Patient was seen and examined on 04/11/2015

## 2015-04-11 NOTE — [facility] Suicide Risk Assessment (Signed)
[facility] Admission Suicide Risk Assessment   Nursing information obtained from:    Demographic factors:    Current Mental Status:    Loss Factors:    Historical Factors:    Risk Reduction Factors:     Total Time spent with patient: 1 hour Principal Problem: Malingering Diagnosis:   Patient Active Problem List   Diagnosis Date Noted  . 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 Antisocial personality disorder [F60.2] 04/11/2015  . Opioid use disorder, moderate, dependence (HCC) [F11.20] 04/10/2015  . 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 HTN (hypertension) [I10] 04/10/2015  . Tobacco use disorder [F17.200] 04/10/2015  . Drug-seeking behavior [F19.10] 03/14/2014  . Chronic back pain greater than 3 months duration [M54.9, G89.29] 01/28/2014  . Gastroesophageal reflux disease with esophagitis [K21.0] 01/28/2014  . Essential hypertension, benign [I10] 01/28/2014  . Asthma, mild persistent [J45.30] 01/28/2014  . Diabetes mellitus type II, controlled (HCC) [E11.9] 01/28/2014  . Morbid obesity (HCC) [E66.01] 01/28/2014  . TIA (transient ischemic attack) [G45.9] 09/02/2013  . HLD (hyperlipidemia) [E78.5] 08/06/2013  . OSA (obstructive sleep apnea) [G47.33] 08/06/2013   Subjective Data:   Continued Clinical Symptoms:  Alcohol Use Disorder Identification Test Final Score (AUDIT): 0 The "Alcohol Use Disorders Identification Test", Guidelines for Use in Primary Care, Second Edition.  World Science writer [facility]). Score between 0-7:  no or low risk or alcohol related problems. Score between 8-15:  moderate risk of alcohol related problems. Score between 16-19:  high risk of alcohol related problems. Score 20 or above:  warrants further diagnostic evaluation for alcohol dependence and treatment.   CLINICAL FACTORS:   Alcohol/Substance Abuse/Dependencies   Psychiatric Specialty Exam: ROS   COGNITIVE FEATURES THAT CONTRIBUTE TO RISK:  None    SUICIDE  RISK:   Minimal: No identifiable suicidal ideation.  Patients presenting with no risk factors but with morbid ruminations; may be classified as minimal risk based on the severity of the depressive symptoms  PLAN OF CARE: d/c home today  I certify that inpatient services furnished can reasonably be expected to improve the patient's condition.   Jimmy Footman, MD 04/11/2015, 10:11 AM

## 2015-04-12 DIAGNOSIS — R410 Disorientation, unspecified: Secondary | ICD-10-CM

## 2015-04-12 DIAGNOSIS — T402X1D Poisoning by other opioids, accidental (unintentional), subsequent encounter: Secondary | ICD-10-CM

## 2015-04-12 DIAGNOSIS — T1491 Suicide attempt: Secondary | ICD-10-CM

## 2015-04-12 DIAGNOSIS — F17200 Nicotine dependence, unspecified, uncomplicated: Secondary | ICD-10-CM

## 2015-04-12 DIAGNOSIS — T402X4D Poisoning by other opioids, undetermined, subsequent encounter: Secondary | ICD-10-CM

## 2015-04-12 DIAGNOSIS — I10 Essential (primary) hypertension: Secondary | ICD-10-CM

## 2015-04-12 DIAGNOSIS — G4733 Obstructive sleep apnea (adult) (pediatric): Secondary | ICD-10-CM

## 2015-04-12 DIAGNOSIS — R400 Somnolence: Secondary | ICD-10-CM

## 2015-04-12 DIAGNOSIS — T40602A Poisoning by unspecified narcotics, intentional self-harm, initial encounter: Secondary | ICD-10-CM

## 2015-04-12 LAB — GLUCOSE, CAPILLARY
GLUCOSE-CAPILLARY: 131 mg/dL — AB (ref 65–99)
GLUCOSE-CAPILLARY: 109 mg/dL — AB (ref 65–99)
GLUCOSE-CAPILLARY: 123 mg/dL — AB (ref 65–99)
Glucose-Capillary: 108 mg/dL — ABNORMAL HIGH (ref 65–99)

## 2015-04-12 LAB — BASIC METABOLIC PANEL
ANION GAP: 5 (ref 5–15)
BUN: 13 mg/dL (ref 6–20)
CALCIUM: 8.4 mg/dL — AB (ref 8.9–10.3)
CHLORIDE: 106 mmol/L (ref 101–111)
CO2: 26 mmol/L (ref 22–32)
Creatinine, Ser: 0.83 mg/dL (ref 0.61–1.24)
GFR calc non Af Amer: >60 mL/min (ref >60)
GLUCOSE: 118 mg/dL — AB (ref 65–99)
POTASSIUM: 4.8 mmol/L (ref 3.5–5.1)
Sodium: 137 mmol/L (ref 135–145)

## 2015-04-12 LAB — CBC
HEMATOCRIT: 40.2 % (ref 39.0–52.0)
HEMOGLOBIN: 13.5 g/dL (ref 13.0–17.0)
MCH: 31.9 pg (ref 26.0–34.0)
MCHC: 33.6 g/dL (ref 30.0–36.0)
MCV: 95 fL (ref 78.0–100.0)
Platelets: 168 10*3/uL (ref 150–400)
RBC: 4.23 MIL/uL (ref 4.22–5.81)
RDW: 12.8 % (ref 11.5–15.5)
WBC: 7.8 10*3/uL (ref 4.0–10.5)

## 2015-04-12 LAB — AMMONIA: Ammonia: 37 umol/L — ABNORMAL HIGH (ref 9–35)

## 2015-04-12 LAB — CBG MONITORING, ED: GLUCOSE-CAPILLARY: 112 mg/dL — AB (ref 65–99)

## 2015-04-12 LAB — MRSA PCR SCREENING: MRSA BY PCR: NEGATIVE

## 2015-04-12 MED ORDER — TRAMADOL HCL 50 MG PO TABS
50.0000 mg | ORAL_TABLET | Freq: Four times a day (QID) | ORAL | Status: DC | PRN
  Filled 2015-04-12: qty 1

## 2015-04-12 MED ORDER — NALOXONE HCL 4 MG/10ML IJ SOLN: 4.0000 mg | Freq: Once | INTRAMUSCULAR | Status: DC | PRN

## 2015-04-12 MED ORDER — INSULIN ASPART 100 UNIT/ML ~~LOC~~ SOLN
0.0000 [IU] | Freq: Three times a day (TID) | SUBCUTANEOUS | Status: DC
  Administered 2015-04-13: 3 [IU] via SUBCUTANEOUS

## 2015-04-12 MED ORDER — PNEUMOCOCCAL VAC POLYVALENT 25 MCG/0.5ML IJ INJ
0.5000 mL | INJECTION | Freq: Once | INTRAMUSCULAR | Status: AC
  Administered 2015-04-12: 0.5 mL via INTRAMUSCULAR
  Filled 2015-04-12: qty 0.5

## 2015-04-12 MED ORDER — TRAMADOL HCL 50 MG PO TABS
100.0000 mg | ORAL_TABLET | Freq: Four times a day (QID) | ORAL | Status: DC | PRN
  Administered 2015-04-12: 100 mg via ORAL
  Filled 2015-04-12: qty 2

## 2015-04-12 MED ORDER — CETYLPYRIDINIUM CHLORIDE 0.05 % MT LIQD
7.0000 mL | Freq: Two times a day (BID) | OROMUCOSAL | Status: DC
  Administered 2015-04-12: 7 mL via OROMUCOSAL

## 2015-04-12 MED ORDER — INSULIN ASPART 100 UNIT/ML ~~LOC~~ SOLN: 0.0000 [IU] | Freq: Every day | SUBCUTANEOUS | Status: DC

## 2015-04-12 NOTE — Progress Notes (Signed)
Patient seen and evaluated this morning. Lethargic but easily aroused by voice. Oriented to name, location, "January" 2017. Follows simple commands. No focal deficits noted on exam.   Please see formal consult note from neurology, Dr Lavon Paganini for full history. Suspect that encephalopathy related to polypharmacy. Mental status appears to be slowly improving. Would hold on further neurological testing (MRI or EEG) at this time. Will follow up as needed if change in neurological status.   Elspeth Cho, DO [facility] [phone]  If 7pm- 7am, please page neurology on call as listed in AMION.

## 2015-04-12 NOTE — Progress Notes (Signed)
Attempted to call report

## 2015-04-12 NOTE — Progress Notes (Signed)
Report given to [facility] on 6N. Pt and son aware of pt transfer.

## 2015-04-12 NOTE — Consult Note (Signed)
[facility] Consult   Reason for Consult:  Possible heroin overdose Referring Physician:  Dr. Maryland Pink Patient Identification: Bryan Gallegos MRN:  [id number] Principal Diagnosis: Overdose Diagnosis:   Patient Active Problem List   Diagnosis Date Noted  . 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 Antisocial personality disorder [F60.2] 04/11/2015  . Opioid use disorder, severe, dependence ([place]) [F11.20] 04/11/2015  . 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 Opioid overdose [T40.2X1A] 04/11/2015  . Overdose [T50.901A] 04/11/2015  . 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 Altered mental status [R41.82]   . Hypoxic brain injury ([place]) [G93.1]   . HTN (hypertension) [I10] 04/10/2015  . Tobacco use disorder [F17.200] 04/10/2015  . Drug-seeking behavior [F19.10] 03/14/2014  . Chronic back pain greater than 3 months duration [M54.9, G89.29] 01/28/2014  . Gastroesophageal reflux disease with esophagitis [K21.0] 01/28/2014  . Essential hypertension, benign [I10] 01/28/2014  . Asthma, mild persistent [J45.30] 01/28/2014  . Diabetes mellitus type II, controlled ([place]) [E11.9] 01/28/2014  . Morbid obesity ([place]) [E66.01] 01/28/2014  . TIA (transient ischemic attack) [G45.9] 09/02/2013  . HLD (hyperlipidemia) [E78.5] 08/06/2013  . OSA (obstructive sleep apnea) [G47.33] 08/06/2013    Total Time spent with patient: 20 minutes  Subjective:   Bryan Gallegos is a 40 y.o. male patient admitted with heroin abuse, possible overdose.  HPI: Bryan Gallegos is a 40 y.o. male with a history of Heroin Abuse, HTN, DM2, Asthma, OSA and Morbid Obesity who was brought to the ED after he became unconscious. His son is at the bedside and gives the history and reports that his father came home from drug rehab today and patient went to the hospital to visit his mother, and went home and after he went in the house shortly he came out of the house and  passed out and stopped breathing. The son reports calling EMS and giving respirations until EMS arrived. The son reports that he was unconscious about 20 minutes. The son suspects that his father overdosed on Heroin. EMS administered intranasal Narcan x 3 without results, and in the ED he was administered Narcan IV x 4 doses and then was placed on a Narcan drip. He began to arouse, but had slurring of his speech and complaints of not being able to see. A CT scan of the Head was performed and was negative for acute findings. Of Note: the patient had an MRI of the Brain 2 days ago. He was weaned from the IV Narcan drip and referred for admission. Neurology was consulted by the EDP and requested that patient be transferred to [facility] evaluation.  Patient is seen and interviewed today. He had just gotten out of the behavioral health [facility] at [facility] yesterday. He was transferred there from the [facility] behavioral health hospital. He lied to get in the behavioral health hospital and said he was suicidal because his girlfriend was there. As noted in the psychiatry discharge summary he has a lengthy legal history of making threats, selling schedule to drugs processing schedule to drugs simple assault breaking and entering and driving with a revoked license. He was recently in jail. On February 8 his urine drug screen was positive for opiates and benzodiazepines. I do not see a urine drug screen on the chart today.  The patient adamantly denied being suicidal or trying to overdose to kill himself. He adamantly denied the use of heroin despite past reports of the same. His primary diagnosis is significant for antisocial/borderline personality disorder and  opiate abuse. He has not and will not benefit from admission to the psychiatric facility because all he does is twist the truth and try to manipulate in order to get more medications. He does have a follow-up appointment  scheduled at day [facility] and he claims that he will attend this appointment  Past Psychiatric History: Numerous recent admissions  Risk to Self: Is patient at risk for suicide?: Yes denies this today Risk to Others:  no Prior Inpatient Therapy:   just discharged from [place] [facility] yesterday Prior Outpatient Therapy:   often noncompliant with treatment  Past Medical History:  Past Medical History  Diagnosis Date  . Heart valve disorder     s/p echocardiogram  . Hypertension   . Asthma   . Diabetes mellitus   . Hyperlipidemia   . Anaphylactic reaction   . Bipolar disorder ([place])   . Adult ADHD   . Morbidly obese ([place])   . Transient cerebral ischemia     Unknown  . OSA (obstructive sleep apnea)   . Anxiety   . Complication of anesthesia     Per patient difficult intubation;  . Difficult intubation     Per patient    Past Surgical History  Procedure Laterality Date  . Carpal tunnel release    . Nose surgery    . Wisdom tooth extraction    . Tooth extraction    . Esophagogastroduodenoscopy N/A 04/05/2015    Procedure: ESOPHAGOGASTRODUODENOSCOPY (EGD);  Surgeon: Rogene Houston, MD;  Location: [facility];  Service: Endoscopy;  Laterality: N/A;   Family History:  Family History  Problem Relation Age of Onset  . CAD Mother     Living  . Diabetes Mellitus II Mother   . Stroke Mother   . Hypertension Mother   . Congestive Heart Failure Mother   . Kidney disease Mother   . Fibromyalgia Mother   . Thyroid disease Mother   . Hyperlipidemia Mother   . Liver disease Mother   . Alcoholism Father 1    Deceased  . Arthritis Maternal Grandmother   . Congestive Heart Failure Maternal Grandmother   . Hypertension Maternal Grandmother   . Lung cancer Maternal Grandfather   . Colon cancer Maternal Aunt   . Stomach cancer Maternal Aunt   . Heart disease Other     Paternal & Maternal  . Hypertension Other     Paternal & Maternal  . Hypertension  Brother     x3  . Hypertension Sister     #1  . Bipolar disorder Sister     #1  . ADD / ADHD Son     x3  . Bipolar disorder Son     x3  . Asperger's syndrome Son    Family Psychiatric  History: none Social History:  History  Alcohol Use  . 0.0 oz/week  . 0 Standard drinks or equivalent per week    Comment: drank 7 beers on Thanksgiving Day 2016, last drank one yr before that     History  Drug Use  . Yes  . Special: Benzodiazepines, Heroin    Comment: Patient denied use.    Social History   Social History  . Marital Status: Single    Spouse Name: N/A  . Number of Children: N/A  . Years of Education: N/A   Social History Main Topics  . Smoking status: Current Every Day Smoker -- 1.00 packs/day for 23 years    Types: Cigarettes  .  Smokeless tobacco: Never Used  . Alcohol Use: 0.0 oz/week    0 Standard drinks or equivalent per week     Comment: drank 7 beers on Thanksgiving Day 2016, last drank one yr before that  . Drug Use: Yes    Special: Benzodiazepines, Heroin     Comment: Patient denied use.  Marland Kitchen Sexual Activity:    Partners: Female    Museum/gallery curator: None   Other Topics Concern  . None   Social History Narrative   Additional Social History:    Allergies:   Allergies  Allergen Reactions  . Atenolol Anaphylaxis  . Other Anaphylaxis    "Prednisone like drugs"  . Tylenol [Acetaminophen] Other (See Comments)    GI Bleed    Labs:  Results for orders placed or performed during the hospital encounter of 04/11/15 (from the past 48 hour(s))  CBC WITH DIFFERENTIAL     Status: Abnormal   Collection Time: 04/11/15  4:38 PM  Result Value Ref Range   WBC 15.4 (H) 4.0 - 10.5 K/uL   RBC 4.95 4.22 - 5.81 MIL/uL   Hemoglobin 15.3 13.0 - 17.0 g/dL   HCT 46.7 39.0 - 52.0 %   MCV 94.3 78.0 - 100.0 fL   MCH 30.9 26.0 - 34.0 pg   MCHC 32.8 30.0 - 36.0 g/dL   RDW 12.6 11.5 - 15.5 %   Platelets 328 150 - 400 K/uL   Neutrophils Relative % 51 %    Lymphocytes Relative 39 %   Monocytes Relative 7 %   Eosinophils Relative 3 %   Basophils Relative 0 %   Neutro Abs 7.8 (H) 1.7 - 7.7 K/uL   Lymphs Abs 6.0 (H) 0.7 - 4.0 K/uL   Monocytes Absolute 1.1 (H) 0.1 - 1.0 K/uL   Eosinophils Absolute 0.5 0.0 - 0.7 K/uL   Basophils Absolute 0.0 0.0 - 0.1 K/uL   WBC Morphology ABSOLUTE LYMPHOCYTOSIS   Comprehensive metabolic panel     Status: Abnormal   Collection Time: 04/11/15  4:38 PM  Result Value Ref Range   Sodium 143 135 - 145 mmol/L   Potassium 4.3 3.5 - 5.1 mmol/L   Chloride 107 101 - 111 mmol/L   CO2 24 22 - 32 mmol/L   Glucose, Bld 220 (H) 65 - 99 mg/dL   BUN 16 6 - 20 mg/dL   Creatinine, Ser 1.02 0.61 - 1.24 mg/dL   Calcium 9.2 8.9 - 10.3 mg/dL   Total Protein 8.1 6.5 - 8.1 g/dL   Albumin 4.3 3.5 - 5.0 g/dL   AST 67 (H) 15 - 41 U/L   ALT 89 (H) 17 - 63 U/L   Alkaline Phosphatase 66 38 - 126 U/L   Total Bilirubin 0.8 0.3 - 1.2 mg/dL   GFR calc non Af Amer >60 >60 mL/min   GFR calc Af Amer >60 >60 mL/min    Comment: (NOTE) The eGFR has been calculated using the CKD EPI equation. This calculation has not been validated in all clinical situations. eGFR's persistently <60 mL/min signify possible Chronic Kidney Disease.    Anion gap 12 5 - 15  Blood gas, arterial ([facility] & [facility] ONLY)     Status: Abnormal   Collection Time: 04/11/15  4:50 PM  Result Value Ref Range   FIO2 1.00    Delivery systems NON-REBREATHER OXYGEN MASK    pH, Arterial 7.280 (L) 7.350 - 7.450   pCO2 arterial 51.3 (H) 35.0 - 45.0 mmHg   pO2, Arterial  363 (H) 80.0 - 100.0 mmHg   Bicarbonate 23.4 20.0 - 24.0 mEq/L   TCO2 21.0 0 - 100 mmol/L   Acid-base deficit 3.5 (H) 0.0 - 2.0 mmol/L   O2 Saturation 99.6 %   Patient temperature 98.6    Collection site LEFT RADIAL    Drawn by [id number]    Sample type ARTERIAL DRAW    Allens test (pass/fail) PASS PASS  Acetaminophen level     Status: Abnormal   Collection Time: 04/11/15  6:55 PM  Result Value Ref Range    Acetaminophen (Tylenol), Serum <10 (L) 10 - 30 ug/mL    Comment:        THERAPEUTIC CONCENTRATIONS VARY SIGNIFICANTLY. A RANGE OF 10-30 ug/mL MAY BE AN EFFECTIVE CONCENTRATION FOR MANY PATIENTS. HOWEVER, SOME ARE BEST TREATED AT CONCENTRATIONS OUTSIDE THIS RANGE. ACETAMINOPHEN CONCENTRATIONS >150 ug/mL AT 4 HOURS AFTER INGESTION AND >50 ug/mL AT 12 HOURS AFTER INGESTION ARE OFTEN ASSOCIATED WITH TOXIC REACTIONS.   Ethanol     Status: None   Collection Time: 04/11/15  6:55 PM  Result Value Ref Range   Alcohol, Ethyl (B) <5 <5 mg/dL    Comment:        LOWEST DETECTABLE LIMIT FOR SERUM ALCOHOL IS 5 mg/dL FOR MEDICAL PURPOSES ONLY   Salicylate level     Status: None   Collection Time: 04/11/15  6:55 PM  Result Value Ref Range   Salicylate Lvl <2.3 2.8 - 30.0 mg/dL  CBG monitoring, ED     Status: Abnormal   Collection Time: 04/12/15 12:04 AM  Result Value Ref Range   Glucose-Capillary 112 (H) 65 - 99 mg/dL   Comment 1 Notify RN    Comment 2 Document in Chart   MRSA PCR Screening     Status: None   Collection Time: 04/12/15  1:30 AM  Result Value Ref Range   MRSA by PCR NEGATIVE NEGATIVE    Comment:        The GeneXpert MRSA Assay (FDA approved for NASAL specimens only), is one component of a comprehensive MRSA colonization surveillance program. It is not intended to diagnose MRSA infection nor to guide or monitor treatment for MRSA infections.   Glucose, capillary     Status: Abnormal   Collection Time: 04/12/15  7:27 AM  Result Value Ref Range   Glucose-Capillary 131 (H) 65 - 99 mg/dL   Comment 1 Document in Chart   Glucose, capillary     Status: Abnormal   Collection Time: 04/12/15 12:28 PM  Result Value Ref Range   Glucose-Capillary 109 (H) 65 - 99 mg/dL   Comment 1 Document in Chart   Basic metabolic panel     Status: Abnormal   Collection Time: 04/12/15 12:34 PM  Result Value Ref Range   Sodium 137 135 - 145 mmol/L   Potassium 4.8 3.5 - 5.1 mmol/L    Chloride 106 101 - 111 mmol/L   CO2 26 22 - 32 mmol/L   Glucose, Bld 118 (H) 65 - 99 mg/dL   BUN 13 6 - 20 mg/dL   Creatinine, Ser 0.83 0.61 - 1.24 mg/dL   Calcium 8.4 (L) 8.9 - 10.3 mg/dL   GFR calc non Af Amer >60 >60 mL/min   GFR calc Af Amer >60 >60 mL/min    Comment: (NOTE) The eGFR has been calculated using the CKD EPI equation. This calculation has not been validated in all clinical situations. eGFR's persistently <60 mL/min signify possible Chronic Kidney Disease.  Anion gap 5 5 - 15  CBC     Status: None   Collection Time: 04/12/15 12:34 PM  Result Value Ref Range   WBC 7.8 4.0 - 10.5 K/uL   RBC 4.23 4.22 - 5.81 MIL/uL   Hemoglobin 13.5 13.0 - 17.0 g/dL   HCT 40.2 39.0 - 52.0 %   MCV 95.0 78.0 - 100.0 fL   MCH 31.9 26.0 - 34.0 pg   MCHC 33.6 30.0 - 36.0 g/dL   RDW 12.8 11.5 - 15.5 %   Platelets 168 150 - 400 K/uL  Ammonia     Status: Abnormal   Collection Time: 04/12/15 12:35 PM  Result Value Ref Range   Ammonia 37 (H) 9 - 35 umol/L    Current Facility-Administered Medications  Medication Dose Route Frequency Provider Last Rate Last Dose  . 0.9 %  sodium chloride infusion   Intravenous Continuous Theressa Millard, MD   Stopped at 04/11/15 2355  . 0.9 %  sodium chloride infusion   Intravenous Continuous Theressa Millard, MD 100 mL/hr at 04/12/15 0000    . alum & mag hydroxide-simeth (MAALOX/MYLANTA) 200-200-20 MG/5ML suspension 30 mL  30 mL Oral Q6H PRN Theressa Millard, MD      . antiseptic oral rinse (CPC / CETYLPYRIDINIUM CHLORIDE 0.05%) solution 7 mL  7 mL Mouth Rinse BID Theressa Millard, MD   7 mL at 04/12/15 0828  . enoxaparin (LOVENOX) injection 80 mg  80 mg Subcutaneous QHS Theressa Millard, MD   80 mg at 04/12/15 0217  . hydrALAZINE (APRESOLINE) injection 5 mg  5 mg Intravenous Q4H PRN Theressa Millard, MD      . insulin aspart (novoLOG) injection 0-9 Units  0-9 Units Subcutaneous 6 times per day Theressa Millard, MD   1 Units at 04/12/15  1610  . naloxone [facility]) injection 0.2 mg  0.2 mg Intravenous PRN Theressa Millard, MD      . pantoprazole (PROTONIX) injection 40 mg  40 mg Intravenous Q24H Theressa Millard, MD   40 mg at 04/12/15 0000  . traMADol (ULTRAM) tablet 50 mg  50 mg Oral Q6H PRN Annita Brod, MD        Musculoskeletal: Strength & Muscle Tone: within normal limits Gait & Station: normal Patient leans: N/A  Psychiatric Specialty Exam: ROS  Blood pressure 91/54, pulse 90, temperature 97.7 F (36.5 C), temperature source Oral, resp. rate 16, height 6' 2" (1.88 m), weight 172.1 kg (379 lb 6.6 oz), SpO2 95 %.Body mass index is 48.69 kg/(m^2).  General Appearance: Casual and Disheveled  Eye Contact::  Good  Speech:  Clear and Coherent  Volume:  Increased  Mood:  Anxious  Affect:  Inappropriate laughing and smiling while talking about possible recent overdose   Thought Process:  Goal Directed  Orientation:  Full (Time, Place, and Person)  Thought Content:  WDL  Suicidal Thoughts:  No  Homicidal Thoughts:  No  Memory:  Immediate;   Fair Recent;   Fair Remote;   Fair  Judgement:  Poor  Insight:  Lacking  Psychomotor Activity:  Normal  Concentration:  Fair  Recall:  [employer] of Knowledge:Fair  Language: Good  Akathisia:  No  Handed:  Right  AIMS (if indicated):     Assets:  Communication Skills Desire for Improvement Resilience  ADL's:  Intact  Cognition: WNL  Sleep:      Treatment Plan Summary: Plan This patient does not meet criteria for  inpatient hospitalization. His primary problem is personality disorder and substance abuse. He has been referred to the appropriate outpatient resources and he claims that he will follow through on this. At this time he is not a risk to self and denies any thoughts or plans to harm himself and he can therefore be discharged  Disposition: No evidence of imminent risk to self or others at present.   Patient does not meet criteria for psychiatric  inpatient admission. Supportive therapy provided about ongoing stressors.  Levonne Spiller, MD 04/12/2015 1:53 PM

## 2015-04-12 NOTE — Progress Notes (Signed)
Paged Rito Ehrlich MD to call and update son of pt's condition.

## 2015-04-12 NOTE — Progress Notes (Signed)
PROGRESS NOTE  GARWOOD WENTZELL ZOX:[id number] DOB: 1975-09-03 DOA: 04/11/2015 PCP: No PCP Per Patient  HPI/Recap of past 24 hours: Patient is a 40 year old male with past mental history of morbid obesity, obstructive sleep apnea, diabetes mellitus and recent inpatient admissions for possible suicide attempts versus attention seeking behavior who was just released from behavioral health facility on 2/10 that morning when that evening he was brought in by his son after being found unresponsive. Son is highly suspicious that the patient was doing IV narcotics. Patient's son says he does not think that it is possible patient simply overdosed on his own medications, because he did not have them as he has been doing jail or behavioral health. In the emergency room, urine drug screen positive for opiates and benzodiazepines. Patient given Narcan. Seen by neurology who felt patient had medication interaction/overdose. There was concern about the possibility of anoxic brain injury.   This morning, patient awake. He is alert and oriented 4, however he seems to forget very easily simple items that I ask him to remember. He denies any intention to try to harm himself.  He denies any IV drug use. States he simply took his regular scheduled medications and nothing more than that. Patient does conveniently leaves out details such as his behavior health time in between his time in jail and coming in last night until reminded of it.    Assessment/Plan: Principal Problem:   Overdose: Unclear if this is suicidal ideation which I doubt versus accidental overdose. Patient may have suffered some anoxia and short-term memory seems to be poor. He also does appear to have some malingering presentation. Have asked psychiatry to see. Stable so we'll move to a MedSurg bed with sitter. Active Problems:   OSA (obstructive sleep apnea)   Chronic back pain greater than 3 months duration: Holding all opiates, Ultram only  Essential hypertension, benign   Asthma, mild persistent   Diabetes mellitus type II, controlled (HCC): CBG stable   Morbid obesity [facility]): Patient needs criteria with BMI greater than 40   Tobacco use disorder: Patient counseled    Anoxic-ischemic encephalopathy (HCC): Seems of issues with short-term memory. We'll continue to monitor   Code Status: Full code   Family Communication: Spoke with son by telephone   Disposition Plan: Depending on outcome patient is deemed to be able to take care of himself, deemed not to be at risk for harming himself   Consultants:  Psychiatry  Neurology   Procedures:  None   Antibiotics:  None    Objective: BP 91/54 mmHg  Pulse 90  Temp(Src) 97.7 F (36.5 C) (Oral)  Resp 16  Ht  (1.88 m)  Wt 172.1 kg (379 lb 6.6 oz)  BMI 48.69 kg/m2  SpO2 95%  Intake/Output Summary (Last 24 hours) at 04/12/15 1446 Last data filed at 04/12/15 1333  Gross per 24 hour  Intake 2234.17 ml  Output    700 ml  Net 1534.17 ml   Filed Weights   04/12/15 0114  Weight: 172.1 kg (379 lb 6.6 oz)    Exam:   General:  Alert and oriented 3, no acute distress    Cardiovascular: Regular rate and rhythm, S1-S2    Respiratory: Decreased breath sounds throughout secondary to body habitus  Abdomen:  soft, obese, nontender, hypoactive bowel sounds   Musculoskeletal: No Clubbing or cyanosis or edema    Data Reviewed: Basic Metabolic Panel:  Recent Labs Lab 04/08/15 1640 04/09/15 0359 04/11/15 1638 04/12/15 1234  NA 138 135 143 137  K 3.9 4.0 4.3 4.8  CL 106 104 107 106  CO2 23 23 24 26   GLUCOSE 203* 121* 220* 118*  BUN 14 14 16 13   CREATININE 0.97 1.00 1.02 0.83  CALCIUM 8.8* 9.0 9.2 8.4*   Liver Function Tests:  Recent Labs Lab 04/08/15 1640 04/09/15 0359 04/11/15 1638  AST 58* 67* 67*  ALT 79* 86* 89*  ALKPHOS 63 68 66  BILITOT 0.9 1.1 0.8  PROT 7.7 7.7 8.1  ALBUMIN 4.1 4.1 4.3   No results for input(s): LIPASE, AMYLASE  in the last 168 hours.  Recent Labs Lab 04/12/15 1235  AMMONIA 37*   CBC:  Recent Labs Lab 04/08/15 1640 04/09/15 0525 04/11/15 1638 04/12/15 1234  WBC 10.8* 9.2 15.4* 7.8  NEUTROABS 6.5  --  7.8*  --   HGB 15.4 15.3 15.3 13.5  HCT 45.6 45.6 46.7 40.2  MCV 91.4 92.7 94.3 95.0  PLT 226 215 328 168   Cardiac Enzymes:   No results for input(s): CKTOTAL, CKMB, CKMBINDEX, TROPONINI in the last 168 hours. BNP (last 3 results)  Recent Labs  04/01/15 0030  BNP 16.0    ProBNP (last 3 results) No results for input(s): PROBNP in the last 8760 hours.  CBG:  Recent Labs Lab 04/10/15 1152 04/10/15 2043 04/12/15 0004 04/12/15 0727 04/12/15 1228  GLUCAP 120* 176* 112* 131* 109*    Recent Results (from the past 240 hour(s))  MRSA PCR Screening     Status: None   Collection Time: 04/12/15  1:30 AM  Result Value Ref Range Status   MRSA by PCR NEGATIVE NEGATIVE Final    Comment:        The GeneXpert MRSA Assay (FDA approved for NASAL specimens only), is one component of a comprehensive MRSA colonization surveillance program. It is not intended to diagnose MRSA infection nor to guide or monitor treatment for MRSA infections.      Studies: Ct Head Wo Contrast  04/11/2015  CLINICAL DATA:  Altered mental status, suspected OD, apneic period. EXAM: CT HEAD WITHOUT CONTRAST TECHNIQUE: Contiguous axial images were obtained from the base of the skull through the vertex without intravenous contrast. COMPARISON:  Head CT dated 08/18/2014 and brain MRI dated 04/08/2015. FINDINGS: Ventricles are normal in size and configuration. There is mild generalized brain atrophy with commensurate dilatation of the sulci. There is no mass, hemorrhage, edema or other evidence of acute parenchymal abnormality. No extra-axial hemorrhage. No osseous fracture or dislocation. Paranasal sinuses and mastoid air cells are clear. Superficial soft tissues are unremarkable. IMPRESSION: Negative head CT.   No intracranial mass, hemorrhage or edema. Electronically Signed   By: Bary Richard M.D.   On: 04/11/2015 18:12   Dg Chest Port 1 View  04/11/2015  CLINICAL DATA:  40 year old found unresponsive earlier today. EXAM: PORTABLE CHEST 1 VIEW COMPARISON:  04/09/2015 and earlier. FINDINGS: Right costophrenic angle excluded from the image. Near expiratory image which accounts for atelectasis in the lung bases and accentuates the heart size. Lungs otherwise clear. IMPRESSION: Near expiratory image accounts for bibasilar atelectasis. Electronically Signed   By: Hulan Saas M.D.   On: 04/11/2015 17:29    Scheduled Meds: . antiseptic oral rinse  7 mL Mouth Rinse BID  . enoxaparin (LOVENOX) injection  80 mg Subcutaneous QHS  . insulin aspart  0-9 Units Subcutaneous 6 times per day  . pantoprazole (PROTONIX) IV  40 mg Intravenous Q24H    Continuous Infusions: . sodium  chloride Stopped (04/11/15 2355)  . sodium chloride 100 mL/hr at 04/12/15 0000     Time spent: 25 minutes  Hollice Espy  [facility] Pager [phone] . If 7PM-7AM, please contact night-coverage at [email or web address], password [facility] 04/12/2015, 2:46 PM  LOS: 1 day

## 2015-04-12 NOTE — Consult Note (Signed)
Requesting Physician: Dr.  Lovell Sheehan    Reason for consultation: Altered mental status  HPI:                                                                                                                                          Bryan Gallegos is an 40 y.o. male patient who presented to [facility] emergency room with altered mental status. Suspected to be opioid overdose. He was started on Narcan drip. Has a continued to have persistent altered mental status and difficulty to arouse, there is suspicion for anoxic brain injury. He has been transferred to [facility] stepdown unit for further neurological assessment . Patient was able to provide history during my evaluation. He reported taking oxycodone 30 mg tabs 6/day, opana 30 mg BID, gabapentin 1200 mg TID, xanax 1 mg TID. He denies any ongoing vision or speech problems no new sensory or motor deficits. He does have chronic muscular skeletal pain symptoms, and he has been requesting pain medications.   Past Medical History: Past Medical History  Diagnosis Date  . Heart valve disorder     s/p echocardiogram  . Hypertension   . Asthma   . Diabetes mellitus   . Hyperlipidemia   . Anaphylactic reaction   . Bipolar disorder (HCC)   . Adult ADHD   . Morbidly obese (HCC)   . Transient cerebral ischemia     Unknown  . OSA (obstructive sleep apnea)   . Anxiety   . Complication of anesthesia     Per patient difficult intubation;  . Difficult intubation     Per patient    Past Surgical History  Procedure Laterality Date  . Carpal tunnel release    . Nose surgery    . Wisdom tooth extraction    . Tooth extraction    . Esophagogastroduodenoscopy N/A 04/05/2015    Procedure: ESOPHAGOGASTRODUODENOSCOPY (EGD);  Surgeon: Malissa Hippo, MD;  Location: [facility];  Service: Endoscopy;  Laterality: N/A;    Family History: Family History  Problem Relation Age of Onset  . CAD Mother     Living  . Diabetes Mellitus II Mother   .  Stroke Mother   . Hypertension Mother   . Congestive Heart Failure Mother   . Kidney disease Mother   . Fibromyalgia Mother   . Thyroid disease Mother   . Hyperlipidemia Mother   . Liver disease Mother   . Alcoholism Father 95    Deceased  . Arthritis Maternal Grandmother   . Congestive Heart Failure Maternal Grandmother   . Hypertension Maternal Grandmother   . Lung cancer Maternal Grandfather   . Colon cancer Maternal Aunt   . Stomach cancer Maternal Aunt   . Heart disease Other     Paternal & Maternal  . Hypertension Other     Paternal & Maternal  . Hypertension Brother  x3  . Hypertension Sister     #1  . Bipolar disorder Sister     #1  . ADD / ADHD Son     x3  . Bipolar disorder Son     x3  . Asperger's syndrome Son     Social History:   reports that he has been smoking Cigarettes.  He has a 23 pack-year smoking history. He has never used smokeless tobacco. He reports that he drinks alcohol. He reports that he uses illicit drugs (Benzodiazepines and Heroin).  Allergies:  Allergies  Allergen Reactions  . Atenolol Anaphylaxis  . Other Anaphylaxis    "Prednisone like drugs"  . Tylenol [Acetaminophen] Other (See Comments)    GI Bleed     Medications:                                                                                                                         Current facility-administered medications:  .  [COMPLETED] sodium chloride 0.9 % bolus 1,000 mL, 1,000 mL, Intravenous, Once, Stopped at 04/11/15 1723 **AND** 0.9 %  sodium chloride infusion, , Intravenous, Continuous, Ron Parker, MD, Stopped at 04/11/15 2355 .  0.9 %  sodium chloride infusion, , Intravenous, Continuous, Ron Parker, MD, Last Rate: 100 mL/hr at 04/12/15 0000 .  alum & mag hydroxide-simeth (MAALOX/MYLANTA) 200-200-20 MG/5ML suspension 30 mL, 30 mL, Oral, Q6H PRN, Ron Parker, MD .  antiseptic oral rinse (CPC / CETYLPYRIDINIUM CHLORIDE 0.05%) solution 7 mL, 7  mL, Mouth Rinse, BID, Harvette C Jenkins, MD .  enoxaparin (LOVENOX) injection 80 mg, 80 mg, Subcutaneous, QHS, Ron Parker, MD, 80 mg at 04/12/15 0217 .  hydrALAZINE (APRESOLINE) injection 5 mg, 5 mg, Intravenous, Q4H PRN, Ron Parker, MD .  insulin aspart (novoLOG) injection 0-9 Units, 0-9 Units, Subcutaneous, 6 times per day, Ron Parker, MD, 0 Units at 04/12/15 0000 .  naloxone (NARCAN) injection 0.2 mg, 0.2 mg, Intravenous, PRN, Ron Parker, MD .  pantoprazole (PROTONIX) injection 40 mg, 40 mg, Intravenous, Q24H, Harvette C Jenkins, MD, 40 mg at 04/12/15 0000   ROS:                                                                                                                                       History obtained from the patient  General ROS: negative for - chills, fatigue,  fever, night sweats, weight gain or weight loss Psychological ROS: negative for - behavioral disorder, hallucinations, memory difficulties, mood swings or suicidal ideation Ophthalmic ROS: negative for - blurry vision, double vision, eye pain or loss of vision ENT ROS: negative for - epistaxis, nasal discharge, oral lesions, sore throat, tinnitus or vertigo Allergy and Immunology ROS: negative for - hives or itchy/watery eyes Hematological and Lymphatic ROS: negative for - bleeding problems, bruising or swollen lymph nodes Endocrine ROS: negative for - galactorrhea, hair pattern changes, polydipsia/polyuria or temperature intolerance Respiratory ROS: negative for - cough, hemoptysis, shortness of breath or wheezing Cardiovascular ROS: negative for - chest pain, dyspnea on exertion, edema or irregular heartbeat Gastrointestinal ROS: negative for - abdominal pain, diarrhea, hematemesis, nausea/vomiting or stool incontinence Genito-Urinary ROS: negative for - dysuria, hematuria, incontinence or urinary frequency/urgency Musculoskeletal ROS: negative for - joint swelling or muscular  weakness Neurological ROS: as noted in HPI Dermatological ROS: negative for rash and skin lesion changes  Neurologic Examination:                                                                                                      Blood pressure 93/71, pulse 91, temperature 98.1 F (36.7 C), temperature source Oral, resp. rate 19, height  (1.88 m), weight 172.1 kg (379 lb 6.6 oz), SpO2 98 %.  Evaluation of higher integrative functions including: Level of alertness: Alert,  Oriented to time, place and person Recent and remote memory - intact   Attention span and concentration  - slightly reduced   Speech: fluent, no evidence of dysarthria or aphasia noted.  Test the following cranial nerves: 2-12 grossly intact Motor examination: Normal tone, bulk, full 5/5 motor strength in all 4 extremities Examination of sensation : Normal and symmetric sensation to pinprick in all 4 extremities and on face Examination of deep tendon reflexes: 2+, normal and symmetric in all extremities, normal plantars bilaterally Test coordination: Normal finger nose testing, with no evidence of limb appendicular ataxia or abnormal involuntary movements or tremors noted.  Gait: Deferred   Lab Results: Basic Metabolic Panel:  Recent Labs Lab 04/08/15 1640 04/09/15 0359 04/11/15 1638  NA 138 135 143  K 3.9 4.0 4.3  CL 106 104 107  CO2 GLUCOSE 203* 121* 220*  BUN CREATININE 0.97 1.00 1.02  CALCIUM 8.8* 9.0 9.2    Liver Function Tests:  Recent Labs Lab 04/08/15 1640 04/09/15 0359 04/11/15 1638  AST 58* 67* 67*  ALT 79* 86* 89*  ALKPHOS 63 68 66  BILITOT 0.9 1.1 0.8  PROT 7.7 7.7 8.1  ALBUMIN 4.1 4.1 4.3   No results for input(s): LIPASE, AMYLASE in the last 168 hours. No results for input(s): AMMONIA in the last 168 hours.  CBC:  Recent Labs Lab 04/08/15 1640 04/09/15 0525 04/11/15 1638  WBC 10.8* 9.2 15.4*  NEUTROABS 6.5  --  7.8*  HGB 15.4 15.3 15.3  HCT  45.6 45.6 46.7  MCV 91.4 92.7 94.3  PLT 226 215 328    Cardiac Enzymes:  No results for input(s): CKTOTAL, CKMB, CKMBINDEX, TROPONINI in the last 168 hours.  Lipid Panel: No results for input(s): CHOL, TRIG, HDL, CHOLHDL, VLDL, LDLCALC in the last 168 hours.  CBG:  Recent Labs Lab 04/09/15 2027 04/10/15 0621 04/10/15 1152 04/10/15 2043 04/12/15 0004  GLUCAP 130* 135* 120* 176* 112*    Microbiology: Results for orders placed or performed during the hospital encounter of 02/20/14  Urine culture     Status: None   Collection Time: 02/21/14 12:53 AM  Result Value Ref Range Status   Specimen Description URINE, CLEAN CATCH  Final   Special Requests NONE  Final   Colony Count NO GROWTH Performed at [facility]   Final   Culture NO GROWTH Performed at [facility]   Final   Report Status 02/22/2014 FINAL  Final     Imaging: Ct Head Wo Contrast  04/11/2015  CLINICAL DATA:  Altered mental status, suspected OD, apneic period. EXAM: CT HEAD WITHOUT CONTRAST TECHNIQUE: Contiguous axial images were obtained from the base of the skull through the vertex without intravenous contrast. COMPARISON:  Head CT dated 08/18/2014 and brain MRI dated 04/08/2015. FINDINGS: Ventricles are normal in size and configuration. There is mild generalized brain atrophy with commensurate dilatation of the sulci. There is no mass, hemorrhage, edema or other evidence of acute parenchymal abnormality. No extra-axial hemorrhage. No osseous fracture or dislocation. Paranasal sinuses and mastoid air cells are clear. Superficial soft tissues are unremarkable. IMPRESSION: Negative head CT.  No intracranial mass, hemorrhage or edema. Electronically Signed   By: Bary Richard M.D.   On: 04/11/2015 18:12   Ct Head Wo Contrast  04/08/2015  CLINICAL DATA:  Left-sided weakness and slurred speech. Multiple recent syncopal episodes. The patient fell off a top bunk and hit his head with resultant loss of  consciousness. EXAM: CT HEAD WITHOUT CONTRAST TECHNIQUE: Contiguous axial images were obtained from the base of the skull through the vertex without intravenous contrast. COMPARISON:  03/30/2015. FINDINGS: Normal appearing cerebral hemispheres and posterior fossa structures. Normal size and position of the ventricles. No skull fracture, intracranial hemorrhage or paranasal sinus air-fluid levels. Inferior right sphenoid sinus mucosal thickening is again noted. IMPRESSION: 1. No acute abnormality. 2. Mild chronic right sphenoid sinusitis. Electronically Signed   By: Beckie Salts M.D.   On: 04/08/2015 17:34   Mr Brain Wo Contrast (neuro Protocol)  04/09/2015  CLINICAL DATA:  Initial evaluation for 2 day history of left-sided weakness. EXAM: MRI HEAD WITHOUT CONTRAST TECHNIQUE: Multiplanar, multiecho pulse sequences of the brain and surrounding structures were obtained without intravenous contrast. COMPARISON:  Prior CT earlier the same day. FINDINGS: Study mildly degraded by motion artifact. Cerebral volume within normal limits for patient age. No significant white matter disease present. No abnormal foci of restricted diffusion to suggest acute intracranial infarct. Gray-white matter differentiation maintained. Normal intravascular flow voids preserved. No acute or chronic intracranial hemorrhage. No areas of chronic infarction. No mass lesion, midline shift, or mass effect. No hydrocephalus. No extra-axial fluid collection. Craniocervical junction within normal limits. Partially visualized upper cervical spine unremarkable. Pituitary gland within normal limits. No acute abnormality about the orbits. Mild opacity within the partially visualized right sphenoid sinus. Paranasal sinuses are otherwise largely clear. Scattered opacity within the bilateral mastoid air cells, right greater than left, likely benign. Inner ear structures within normal limits. Bone marrow signal intensity within normal limits. Scalp soft  tissues demonstrate no acute abnormality. IMPRESSION: Negative MRI of the brain. No acute  intracranial infarct or other process identified. Electronically Signed   By: Rise Mu M.D.   On: 04/09/2015 00:15   Dg Chest Port 1 View  04/11/2015  CLINICAL DATA:  40 year old found unresponsive earlier today. EXAM: PORTABLE CHEST 1 VIEW COMPARISON:  04/09/2015 and earlier. FINDINGS: Right costophrenic angle excluded from the image. Near expiratory image which accounts for atelectasis in the lung bases and accentuates the heart size. Lungs otherwise clear. IMPRESSION: Near expiratory image accounts for bibasilar atelectasis. Electronically Signed   By: Hulan Saas M.D.   On: 04/11/2015 17:29   Dg Abd Acute W/chest  04/09/2015  CLINICAL DATA:  40 year old male with recent battery ingestion. EXAM: DG ABDOMEN ACUTE W/ 1V CHEST COMPARISON:  Radiograph dated 04/04/2015 FINDINGS: The radiopaque density noted in the prior study is not visualized on the current exam. No radiopaque foreign object identified. There is no bowel obstruction. No free air. No radiopaque calculi. The lungs are clear. No pleural effusion or pneumothorax the cardiac silhouette is within normal limits. The osseous structures appear unremarkable. IMPRESSION: The previously seen battery is not visualized on this study. No radiopaque foreign object. No evidence of bowel obstruction. Electronically Signed   By: Elgie Collard M.D.   On: 04/09/2015 03:30    Assessment and plan:   Bryan Gallegos is an 40 y.o. male patient who presented with altered mental status to the [facility] ER, suspected opioid overdose secondary to his prescription medications. He takes high doses of pain and neurological medicines which include oxycodone 30 mg tabs 6/day, opana 30 mg BID, gabapentin 1200 mg TID, xanax 1 mg TID. He was on narcan gtt in [facility] ER prior to transfer [facility]. His mental status is significant improved now during my evaluation, alert,  oriented, grossly nonfocal neurological examination. Suspect his mental status changes were secondary to the polypharmacy. Recommend reducing his pain medicine doses as tolerated, and also reduce gabapentin dose by half to 600 mg times a day. At this time no clear indication for EEG. Patient just had a brain MRI done 2 days ago which did not show any acute pathology. At this time as his neurological examination is nonfocal and his mental status returned to baseline, but defer any further testing for now. But if there is any continued mental status changes or new neurological symptoms, then would consider an EEG and brain MRI at that time.  Neurology service will follow up when necessary. please call for any further questions.

## 2015-04-12 NOTE — Discharge Summary (Signed)
Discharge Summary  Bryan Gallegos ZOX:[id number] DOB: 13-Jul-1975  PCP: No PCP Per Patient/? Since he has seen Dr. minor in [place]. Cannot find records of this  Admit date: 04/11/2015 Discharge date: 04/13/2015  Time spent: 35 minutes  Recommendations for Outpatient Follow-up:  1. New medication: Narcan 0.4 mg subcutaneous vial as needed for opiate overdose   Discharge Diagnoses:  Active Hospital Problems   Diagnosis Date Noted  . Overdose 04/11/2015  . Opioid overdose 04/11/2015  . Anoxic-ischemic encephalopathy (HCC) 04/11/2015  . Tobacco use disorder 04/10/2015  . Essential hypertension, benign 01/28/2014  . Diabetes mellitus type II, controlled (HCC) 01/28/2014  . Chronic back pain greater than 3 months duration 01/28/2014  . Morbid obesity (HCC) 01/28/2014  . Asthma, mild persistent 01/28/2014  . OSA (obstructive sleep apnea) 08/06/2013    Resolved Hospital Problems   Diagnosis Date Noted Date Resolved  No resolved problems to display.    Discharge Condition: Improved from initial presentation, being discharged home   Diet recommendation: Carb modified heart healthy  Filed Weights   04/12/15 0114  Weight: 172.1 kg (379 lb 6.6 oz)    History of present illness:  Patient is a 40 year old male with past mental history of morbid obesity, obstructive sleep apnea, diabetes mellitus and recent inpatient admissions for possible suicide attempts versus attention seeking behavior who was just released from behavioral health facility on 2/10 that morning when that evening he was brought in by his son after being found unresponsive. Son is highly suspicious that the patient was doing IV narcotics. Patient's son says he does not think that it is possible patient simply overdosed on his own medications, because he did not have them as he has been doing jail or behavioral health. In the emergency room, urine drug screen positive for opiates and benzodiazepines. Patient  given Narcan. Seen by neurology who felt patient had medication interaction/overdose. There was concern about the possibility of anoxic brain injury.  By following morning, patient awake and alert. Vital signs stable, patient deflects when asked about illegal drug use. 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 Patient initially amenable to staying overnight, but then prior to being formally discharged on 2/12, left AMA. He did remove his IV himself prior to discharge. Hospitalist attending had several discussions with the patient's son and prescription given for patient sent to his pharmacy for naloxone in anticipation of potential future overdose  Hospital Course:  Principal Problem:    OSA (obstructive sleep apnea): Patient refused BiPAP   Chronic back pain greater than 3 months duration   Essential hypertension, benign: Blood pressure stable   Asthma, mild persistent   Diabetes mellitus type II, controlled (HCC): CBG stable during this hospitalization.    Morbid obesity [facility]): Patient meets criteria with BMI greater than 40    Tobacco use disorder: Counseled    Opioid overdose: Unintentional.  Suspect that this is in part from patient's substance abuse. He deflects significantly. 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    Anoxic-ischemic encephalopathy [facility]): Patient manipulative, and unclear, he is oriented with appropriate thought processes. When asked to remember items, he is not able to do this although this may be intentional. He is still felt to be of sound mind and able to make decisions of his own care  Procedures:  None  Consultations:  Psychiatry  Discharge Exam: BP 115/62 mmHg  Pulse 90  Temp(Src) 97.7 F (36.5 C) (Oral)  Resp 16  Ht 6'  2" (1.88 m)  Wt 172.1 kg (379 lb 6.6 oz)  BMI 48.69 kg/m2  SpO2 95%  General: Alert and oriented 4 Cardiovascular: Regular rate and rhythm, S1-S2  Respiratory: Clear to auscultation bilaterally  Discharge Instructions You were cared for by a hospitalist during your hospital stay. If you have any questions about your discharge medications or the care you received while you were in the hospital after you are discharged, you can call the unit and asked to speak with the hospitalist on call if the hospitalist that took care of you is not available. Once you are discharged, your primary care physician will handle any further medical issues. Please note that NO REFILLS for any discharge medications will be authorized once you are discharged, as it is imperative that you return to your primary care physician (or establish a relationship with a primary care physician if you do not have one) for your aftercare needs so that they can reassess your need for medications and monitor your lab values.     Medication List    TAKE these medications        albuterol 108 (90 Base) MCG/ACT inhaler  Commonly known as:  PROVENTIL HFA;VENTOLIN HFA  Inhale 2 puffs into the lungs every 6 (six) hours as needed for wheezing or shortness of breath.     aspirin 325 MG tablet  Take 1 tablet (325 mg total) by mouth daily.     furosemide 20 MG tablet  Commonly known as:  LASIX  Take 1 tablet (20 mg total) by mouth daily.     gabapentin 400 MG capsule  Commonly known as:  NEURONTIN  Take 2 capsules (800 mg total) by mouth 3 (three) times daily.     hydrOXYzine 50 MG capsule  Commonly known as:  VISTARIL  Take 1 capsule (50 mg total) by mouth 3 (three) times daily as needed for anxiety.     linagliptin 5 MG Tabs tablet  Commonly known as:  TRADJENTA  Take 1 tablet (5 mg total) by mouth daily before breakfast.     lisinopril 40 MG tablet  Commonly known as:  PRINIVIL,ZESTRIL  Take 1 tablet (40 mg total) by  mouth 2 (two) times daily.     metFORMIN 500 MG tablet  Commonly known as:  GLUCOPHAGE  Take 1 tablet (500 mg total) by mouth 2 (two) times daily with a meal.     Naloxone HCl 4 MG/10ML Soln  Inject 4 mg as directed once as needed (in event of).     omeprazole 20 MG capsule  Commonly known as:  PRILOSEC  Take 1 capsule (20 mg total) by mouth daily.     pravastatin 40 MG tablet  Commonly known as:  PRAVACHOL  Take 1 tablet (40 mg total) by mouth at bedtime.       Allergies  Allergen Reactions  . Atenolol Anaphylaxis  . Other Anaphylaxis    "Prednisone like drugs"  . Tylenol [Acetaminophen] Other (See Comments)    GI Bleed      The results of significant diagnostics from this hospitalization (including imaging, microbiology, ancillary and laboratory) are listed below for reference.    Significant Diagnostic Studies: Ct Abdomen Pelvis Wo Contrast  03/30/2015  CLINICAL DATA:  Periumbilical abdominal pain EXAM: CT ABDOMEN AND PELVIS WITHOUT CONTRAST TECHNIQUE: Multidetector  CT imaging of the abdomen and pelvis was performed following the standard protocol without IV contrast. COMPARISON:  06/07/2014 FINDINGS: Lung bases are free of acute infiltrate or sizable effusion. Pulmonary nodule is again noted in the posterior right lower lobe stable from the prior exam. The liver, gallbladder, spleen, adrenal glands and pancreas are within normal limits. Mild aortic calcifications are seen. The appendix is well visualized and within normal limits. Minimal diverticular change of the colon is seen. The bladder is partially distended. No pelvic mass lesion or sidewall abnormality is seen. The osseous structures show no acute abnormality. IMPRESSION: No acute abnormality noted. Stable pulmonary nodule consistent with benign etiology as previously described. Electronically Signed   By: Alcide Clever M.D.   On: 03/30/2015 15:25   Dg Chest 1 View  04/05/2015  CLINICAL DATA:  Patient swallowed multiple  batteries. EXAM: CHEST 1 VIEW COMPARISON:  Chest radiograph 04/01/2015 FINDINGS: Normal cardiac and mediastinal contours. No consolidative pulmonary opacities. No pleural effusion or pneumothorax. Regional skeleton is unremarkable. IMPRESSION: No acute cardiopulmonary process. Electronically Signed   By: Annia Belt M.D.   On: 04/05/2015 00:00   Dg Abd 1 View  04/05/2015  CLINICAL DATA:  Patient swallowed multiple batteries. EXAM: ABDOMEN - 1 VIEW COMPARISON:  CT abdomen pelvis 03/30/2015 FINDINGS: Large radiodensity within the central abdomen. Nonobstructed bowel gas pattern. Lung bases are clear. No free fluid or free intraperitoneal air. Regional skeleton is unremarkable. IMPRESSION: Radiodensity within the central abdomen likely corresponds with reported history of swallowed battery. Nonobstructed bowel gas pattern. Electronically Signed   By: Annia Belt M.D.   On: 04/05/2015 00:02   Ct Head Wo Contrast  04/11/2015  CLINICAL DATA:  Altered mental status, suspected OD, apneic period. EXAM: CT HEAD WITHOUT CONTRAST TECHNIQUE: Contiguous axial images were obtained from the base of the skull through the vertex without intravenous contrast. COMPARISON:  Head CT dated 08/18/2014 and brain MRI dated 04/08/2015. FINDINGS: Ventricles are normal in size and configuration. There is mild generalized brain atrophy with commensurate dilatation of the sulci. There is no mass, hemorrhage, edema or other evidence of acute parenchymal abnormality. No extra-axial hemorrhage. No osseous fracture or dislocation. Paranasal sinuses and mastoid air cells are clear. Superficial soft tissues are unremarkable. IMPRESSION: Negative head CT.  No intracranial mass, hemorrhage or edema. Electronically Signed   By: Bary Richard M.D.   On: 04/11/2015 18:12   Ct Head Wo Contrast  04/08/2015  CLINICAL DATA:  Left-sided weakness and slurred speech. Multiple recent syncopal episodes. The patient fell off a top bunk and hit his head with  resultant loss of consciousness. EXAM: CT HEAD WITHOUT CONTRAST TECHNIQUE: Contiguous axial images were obtained from the base of the skull through the vertex without intravenous contrast. COMPARISON:  03/30/2015. FINDINGS: Normal appearing cerebral hemispheres and posterior fossa structures. Normal size and position of the ventricles. No skull fracture, intracranial hemorrhage or paranasal sinus air-fluid levels. Inferior right sphenoid sinus mucosal thickening is again noted. IMPRESSION: 1. No acute abnormality. 2. Mild chronic right sphenoid sinusitis. Electronically Signed   By: Beckie Salts M.D.   On: 04/08/2015 17:34   Ct Head Wo Contrast  03/30/2015  CLINICAL DATA:  Code stroke, morbid obesity EXAM: CT HEAD WITHOUT CONTRAST TECHNIQUE: Contiguous axial images were obtained from the base of the skull through the vertex without contrast. COMPARISON:  08/18/2014 FINDINGS: Limited exam with motion artifact. No acute intracranial hemorrhage, definite infarction, mass lesion, mass effect, focal edema. Normal gray-white matter differentiation. No extra-axial  fluid collection demonstrated. Cisterns are patent. No cerebellar abnormality. Orbits are symmetric. Minor right sphenoid mucosal thickening. Other sinuses and mastoids remain clear. No skull abnormality. IMPRESSION: Limited exam but no acute intracranial process by noncontrast CT. These results were called by telephone at the time of interpretation on 03/30/2015 at 11:50 am to Dr. Vanetta Mulders , who verbally acknowledged these results. Electronically Signed   By: Judie Petit.  Shick M.D.   On: 03/30/2015 11:53   Mr Brain Wo Contrast (neuro Protocol)  04/09/2015  CLINICAL DATA:  Initial evaluation for 2 day history of left-sided weakness. EXAM: MRI HEAD WITHOUT CONTRAST TECHNIQUE: Multiplanar, multiecho pulse sequences of the brain and surrounding structures were obtained without intravenous contrast. COMPARISON:  Prior CT earlier the same day. FINDINGS: Study  mildly degraded by motion artifact. Cerebral volume within normal limits for patient age. No significant white matter disease present. No abnormal foci of restricted diffusion to suggest acute intracranial infarct. Gray-white matter differentiation maintained. Normal intravascular flow voids preserved. No acute or chronic intracranial hemorrhage. No areas of chronic infarction. No mass lesion, midline shift, or mass effect. No hydrocephalus. No extra-axial fluid collection. Craniocervical junction within normal limits. Partially visualized upper cervical spine unremarkable. Pituitary gland within normal limits. No acute abnormality about the orbits. Mild opacity within the partially visualized right sphenoid sinus. Paranasal sinuses are otherwise largely clear. Scattered opacity within the bilateral mastoid air cells, right greater than left, likely benign. Inner ear structures within normal limits. Bone marrow signal intensity within normal limits. Scalp soft tissues demonstrate no acute abnormality. IMPRESSION: Negative MRI of the brain. No acute intracranial infarct or other process identified. Electronically Signed   By: Rise Mu M.D.   On: 04/09/2015 00:15   Dg Chest Port 1 View  04/11/2015  CLINICAL DATA:  40 year old found unresponsive earlier today. EXAM: PORTABLE CHEST 1 VIEW COMPARISON:  04/09/2015 and earlier. FINDINGS: Right costophrenic angle excluded from the image. Near expiratory image which accounts for atelectasis in the lung bases and accentuates the heart size. Lungs otherwise clear. IMPRESSION: Near expiratory image accounts for bibasilar atelectasis. Electronically Signed   By: Hulan Saas M.D.   On: 04/11/2015 17:29   Dg Chest Port 1 View  04/01/2015  CLINICAL DATA:  Sharp central and left-sided chest pain. Shortness of breath. Symptom onset 3 hours prior. EXAM: PORTABLE CHEST 1 VIEW COMPARISON:  Radiographs 08/18/2014 FINDINGS: The cardiomediastinal contours are normal.  Central bronchial thickening unchanged. Pulmonary vasculature is normal. No consolidation, pleural effusion, or pneumothorax. No acute osseous abnormalities are seen. IMPRESSION: No acute pulmonary process.  Stable bronchitic change. Electronically Signed   By: Rubye Oaks M.D.   On: 04/01/2015 00:54   Dg Abd Acute W/chest  04/09/2015  CLINICAL DATA:  40 year old male with recent battery ingestion. EXAM: DG ABDOMEN ACUTE W/ 1V CHEST COMPARISON:  Radiograph dated 04/04/2015 FINDINGS: The radiopaque density noted in the prior study is not visualized on the current exam. No radiopaque foreign object identified. There is no bowel obstruction. No free air. No radiopaque calculi. The lungs are clear. No pleural effusion or pneumothorax the cardiac silhouette is within normal limits. The osseous structures appear unremarkable. IMPRESSION: The previously seen battery is not visualized on this study. No radiopaque foreign object. No evidence of bowel obstruction. Electronically Signed   By: Elgie Collard M.D.   On: 04/09/2015 03:30    Microbiology: Recent Results (from the past 240 hour(s))  MRSA PCR Screening     Status: None   Collection Time:  04/12/15  1:30 AM  Result Value Ref Range Status   MRSA by PCR NEGATIVE NEGATIVE Final    Comment:        The GeneXpert MRSA Assay (FDA approved for NASAL specimens only), is one component of a comprehensive MRSA colonization surveillance program. It is not intended to diagnose MRSA infection nor to guide or monitor treatment for MRSA infections.      Labs: Basic Metabolic Panel:  Recent Labs Lab 04/08/15 1640 04/09/15 0359 04/11/15 1638 04/12/15 1234  NA 138 135 143 137  K 3.9 4.0 4.3 4.8  CL 106 104 107 106  CO2 23 23 24 26   GLUCOSE 203* 121* 220* 118*  BUN 14 14 16 13   CREATININE 0.97 1.00 1.02 0.83  CALCIUM 8.8* 9.0 9.2 8.4*   Liver Function Tests:  Recent Labs Lab 04/08/15 1640 04/09/15 0359 04/11/15 1638  AST 58* 67* 67*    ALT 79* 86* 89*  ALKPHOS 63 68 66  BILITOT 0.9 1.1 0.8  PROT 7.7 7.7 8.1  ALBUMIN 4.1 4.1 4.3   No results for input(s): LIPASE, AMYLASE in the last 168 hours.  Recent Labs Lab 04/12/15 1235  AMMONIA 37*   CBC:  Recent Labs Lab 04/08/15 1640 04/09/15 0525 04/11/15 1638 04/12/15 1234  WBC 10.8* 9.2 15.4* 7.8  NEUTROABS 6.5  --  7.8*  --   HGB 15.4 15.3 15.3 13.5  HCT 45.6 45.6 46.7 40.2  MCV 91.4 92.7 94.3 95.0  PLT 226 215 328 168   Cardiac Enzymes: No results for input(s): CKTOTAL, CKMB, CKMBINDEX, TROPONINI in the last 168 hours. BNP: BNP (last 3 results)  Recent Labs  04/01/15 0030  BNP 16.0    ProBNP (last 3 results) No results for input(s): PROBNP in the last 8760 hours.  CBG:  Recent Labs Lab 04/10/15 2043 04/12/15 0004 04/12/15 0727 04/12/15 1228 04/12/15 1608  GLUCAP 176* 112* 131* 109* 123*       Signed:  Bobbette Eakes K  [facility] 04/12/2015, 4:44 PM

## 2015-04-13 LAB — GLUCOSE, CAPILLARY: GLUCOSE-CAPILLARY: 142 mg/dL — AB (ref 65–99)

## 2015-04-13 MED ORDER — PANTOPRAZOLE SODIUM 40 MG PO TBEC: 40.0000 mg | DELAYED_RELEASE_TABLET | Freq: Every day | ORAL | Status: DC

## 2015-04-13 NOTE — Progress Notes (Signed)
Pt. refused BiPAP for this evening, notified to call if needing.

## 2015-04-13 NOTE — Progress Notes (Addendum)
Patient left unit against medical advise, son was about to pick him up but saw him by the entrance and he claimed to his son that he was discharged. I told his son that he is not discharged yet and that he needs to get back to the unit and wait for MD to properly discharge him so I can give him the instructions. Pt's son sounds frustrated with his dad and he said "Pt. Is stupid". Also noted that patient pulled his IV.

## 2015-04-14 LAB — HEMOGLOBIN A1C
HEMOGLOBIN A1C: 6.3 % — AB (ref 4.8–5.6)
MEAN PLASMA GLUCOSE: 134 mg/dL

## 2015-04-15 MED FILL — Naloxone HCl Inj 0.4 MG/ML: INTRAMUSCULAR | Qty: 1 | Status: AC

## 2015-04-16 LAB — GLUCOSE, CAPILLARY: GLUCOSE-CAPILLARY: 125 mg/dL — AB (ref 65–99)

## 2015-05-06 IMAGING — CR DG HAND COMPLETE 3+V*L*
3 series · 3 of 3 positions shown · non-contrast
Comparison: None

CLINICAL DATA: BILATERAL hand swelling and redness for 3 months,
history hypertension, diabetes

EXAM:
LEFT HAND - COMPLETE 3+ VIEW

[x hand pa left]
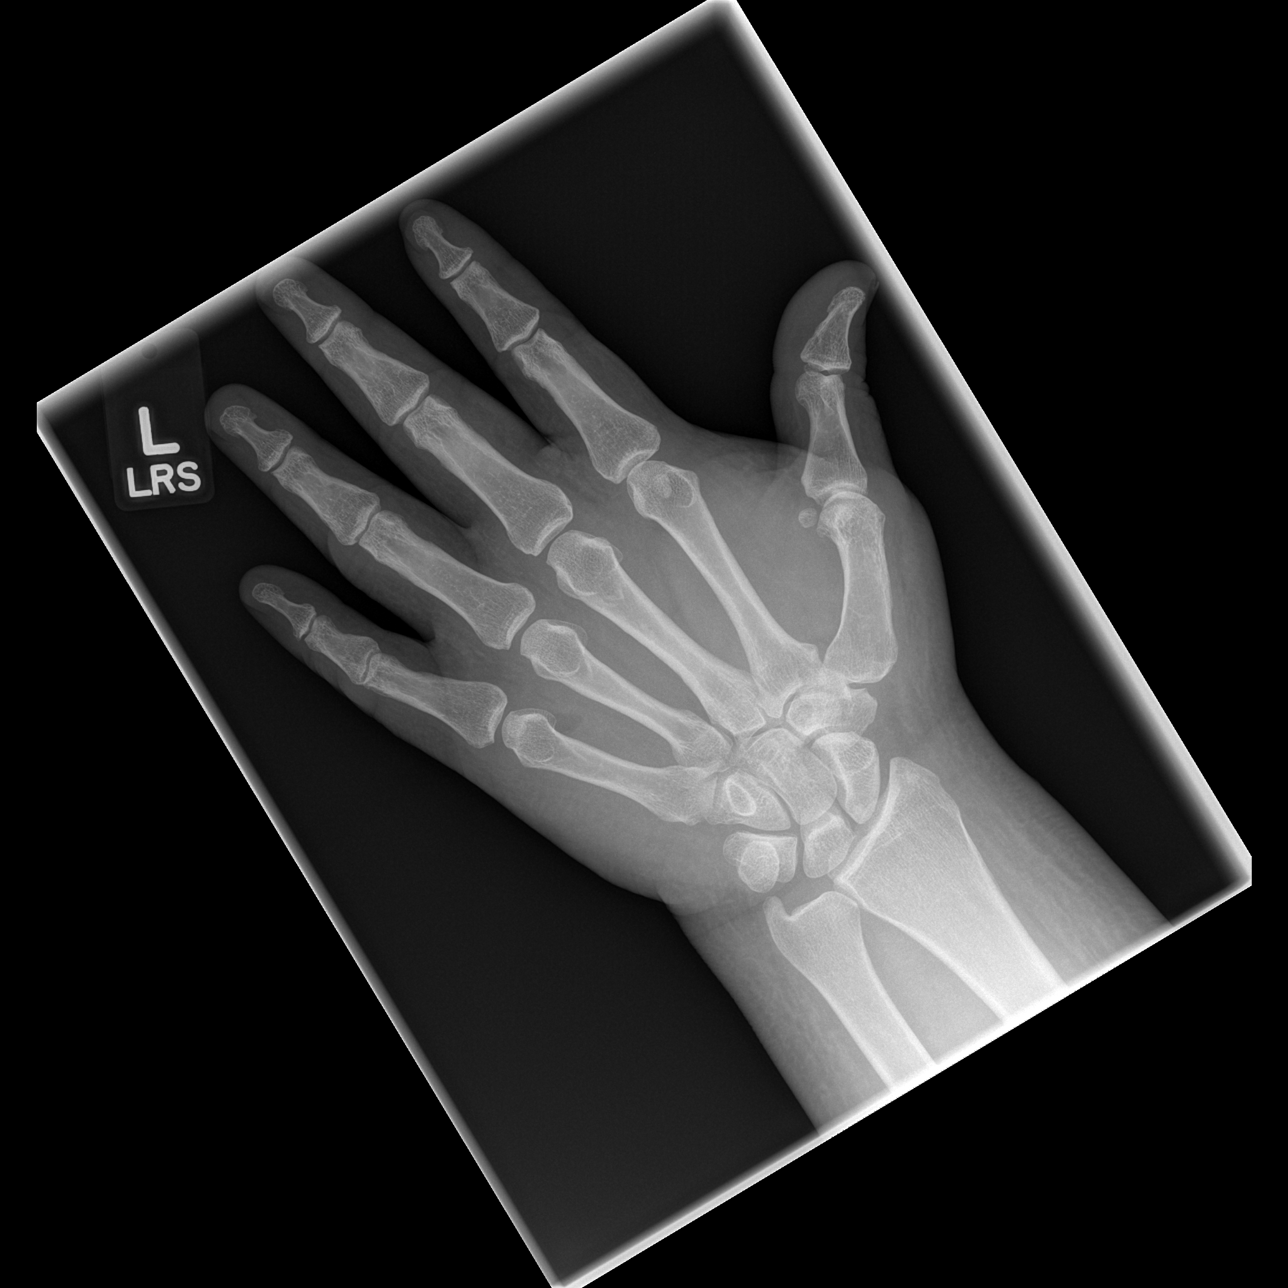

[x hand lat left]
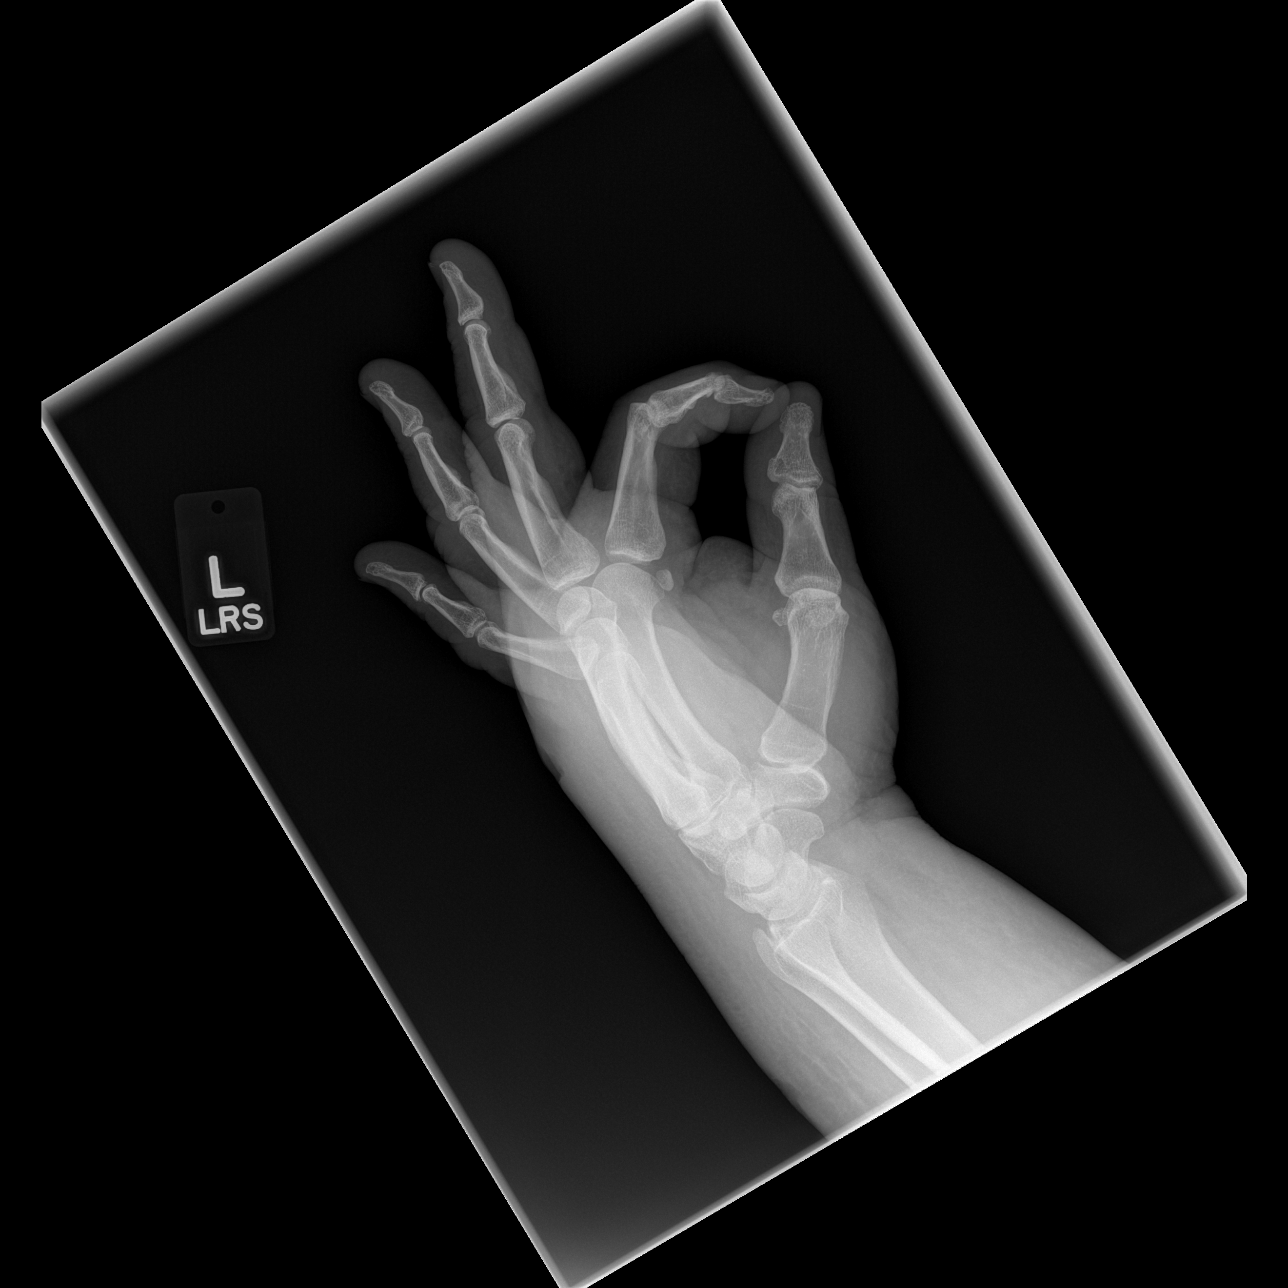

[x hand oblique left]
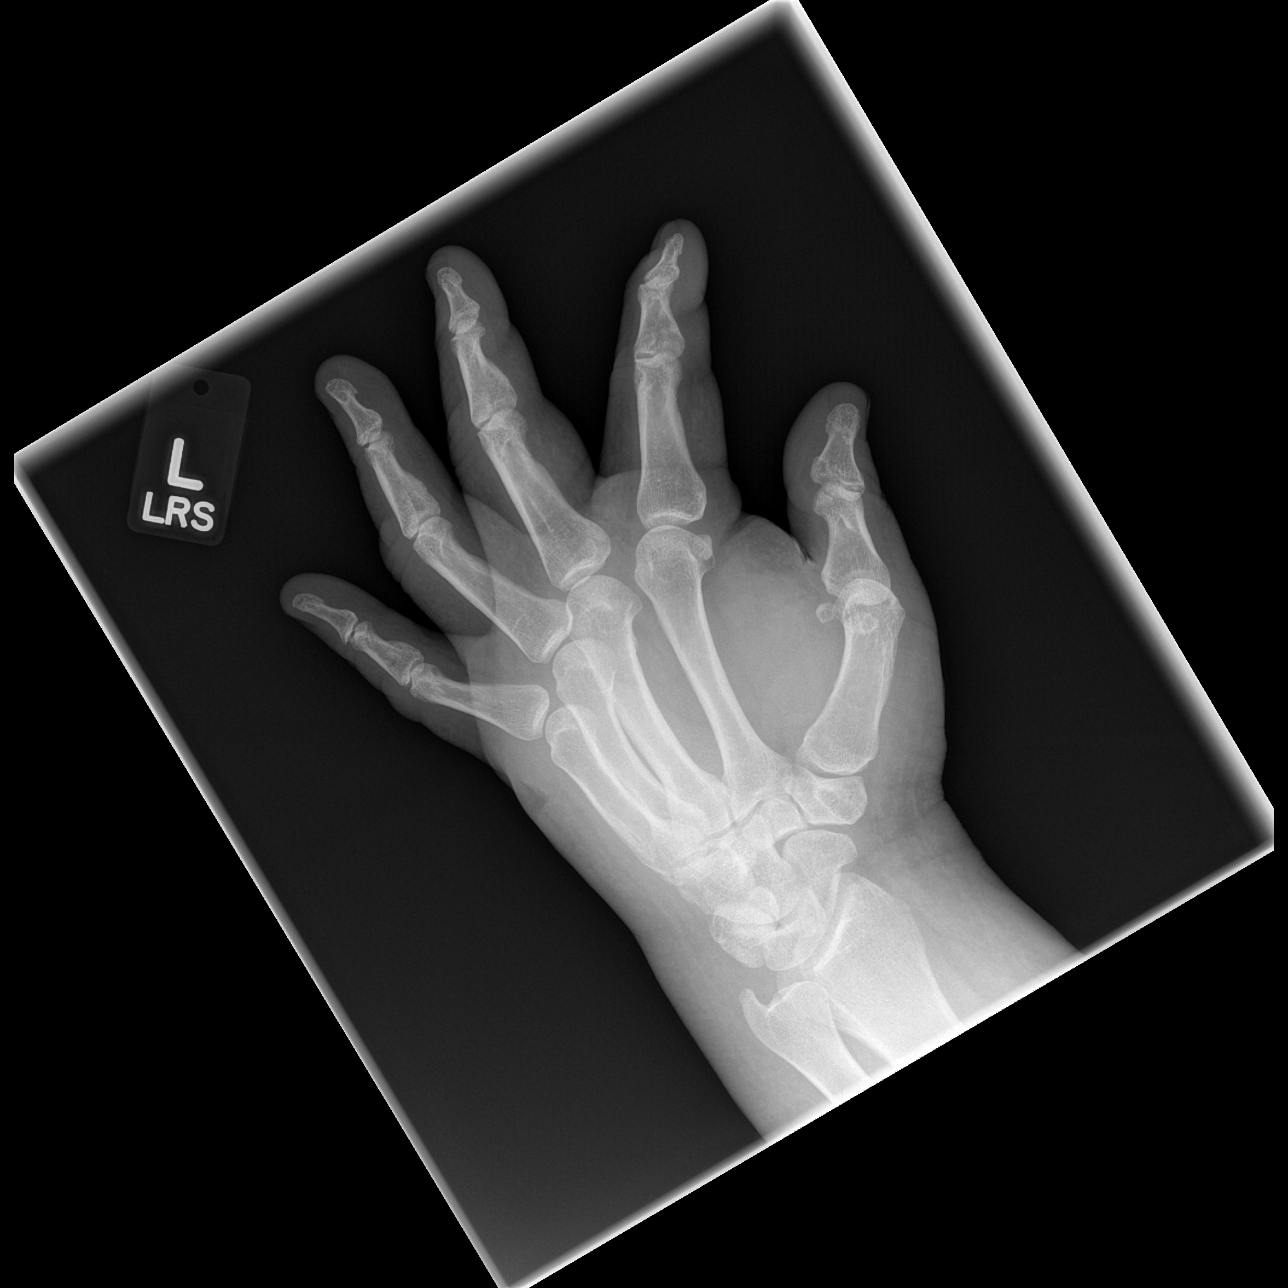

[3 of 3 positions shown; findings below may reference images not displayed]

FINDINGS: Marked diffuse soft tissue swelling.

Osseous mineralization grossly normal for technique.

No acute fracture, dislocation, or bone destruction.

Bones unremarkable.
IMPRESSION: No acute osseous abnormalities.

Marked soft tissue swelling.

## 2015-05-06 IMAGING — CR DG HAND COMPLETE 3+V*R*
3 series · 3 of 3 positions shown · non-contrast
Comparison: None.

CLINICAL DATA: Bilateral hand swelling and redness for 3 months.

EXAM:
RIGHT HAND - COMPLETE 3+ VIEW

[x hand pa right]
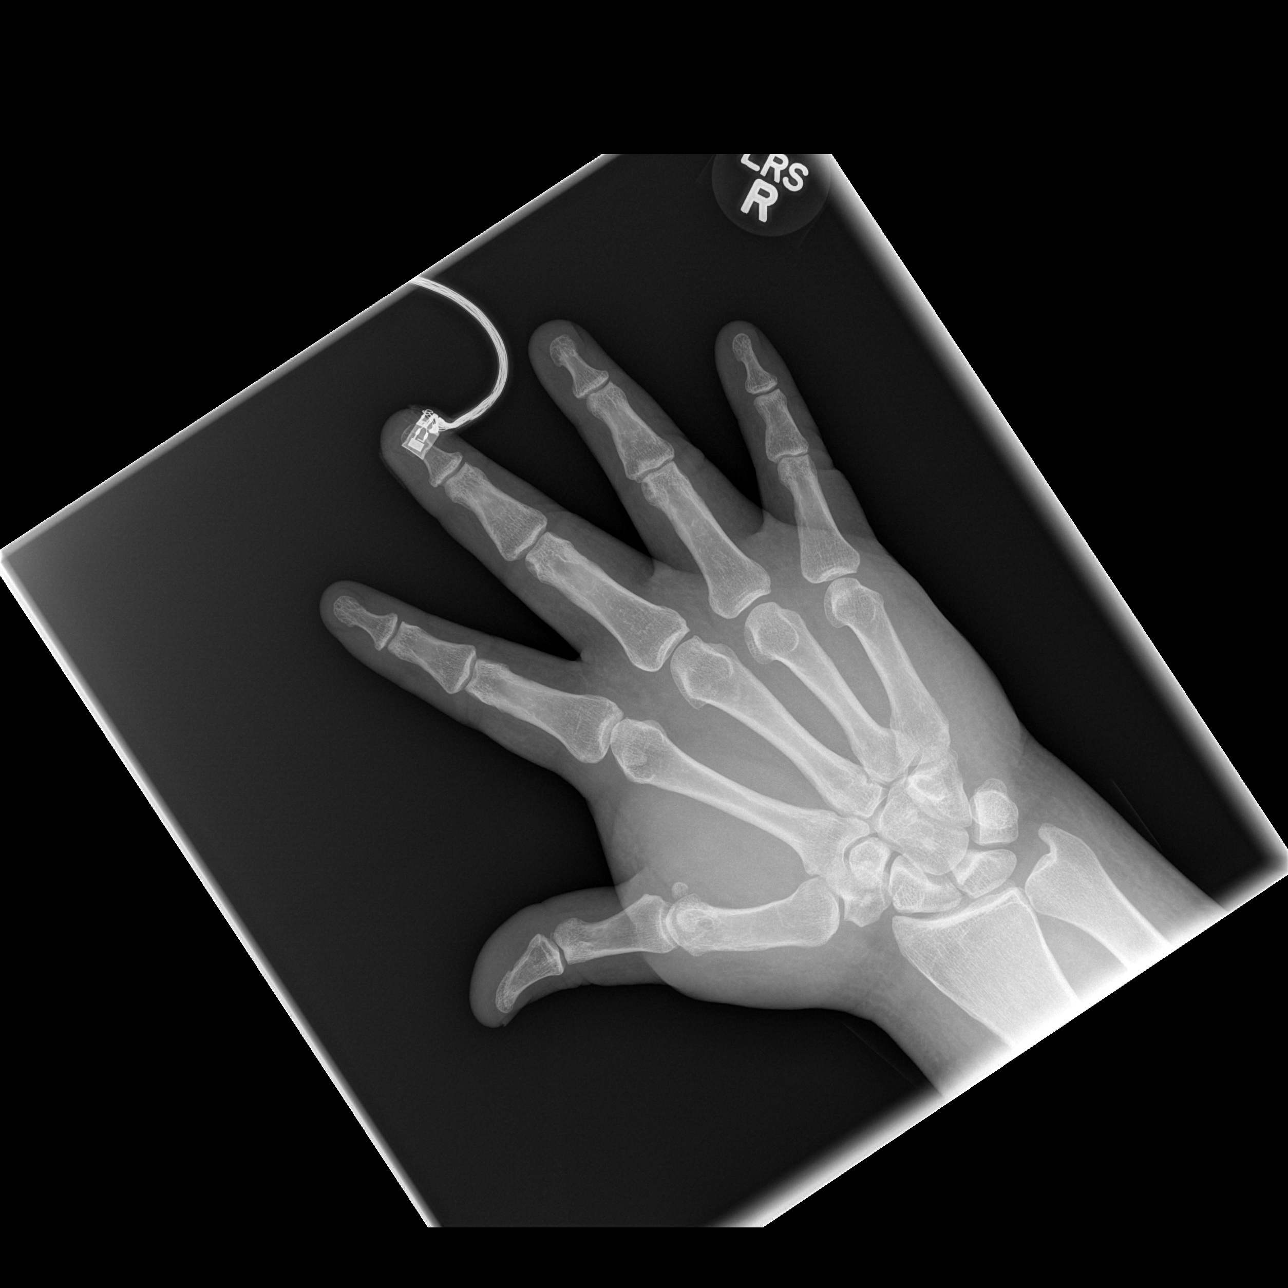

[x hand oblique right]
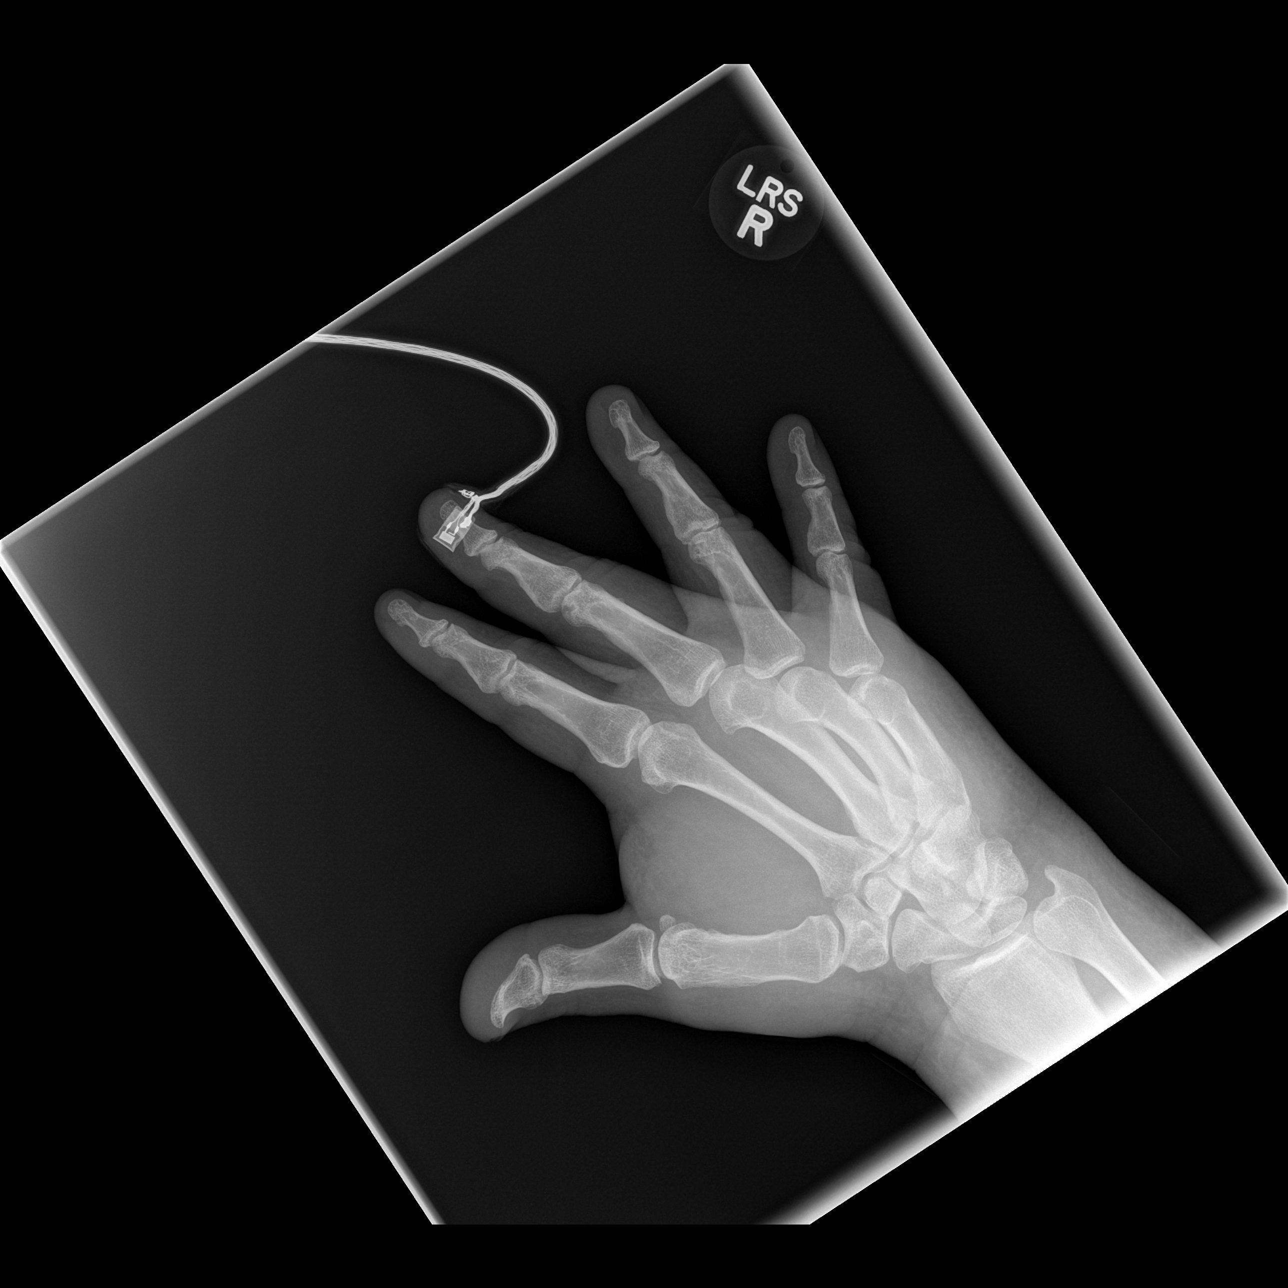

[x hand lat right]
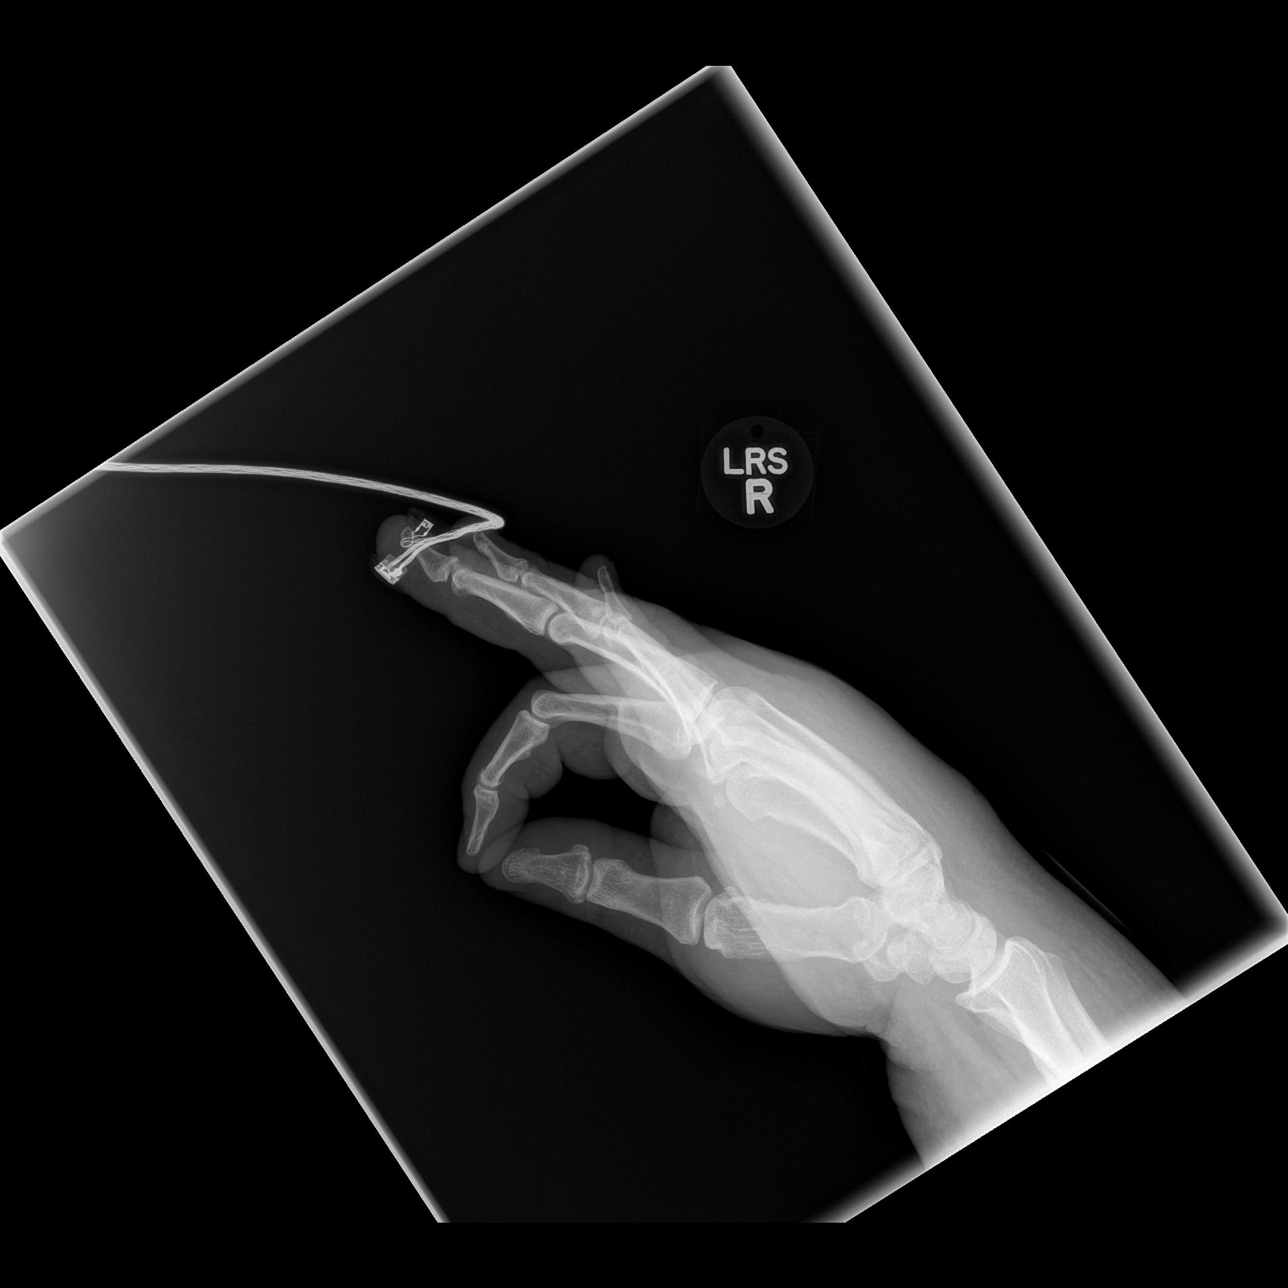

[3 of 3 positions shown; findings below may reference images not displayed]

FINDINGS: Diffuse soft tissue swelling of the right hand and wrist. No
radiopaque soft tissue foreign bodies. No evidence of acute fracture
or dislocation. There appears to be an old fracture deformity of the
fifth metacarpal bone. No bone erosion or periosteal reaction noted.
IMPRESSION: Diffuse soft tissue swelling.  No acute bony abnormalities.

## 2015-05-06 IMAGING — CT CT HEAD W/O CM
1 series · 16 of 30 positions shown, 20 images · non-contrast
Comparison: 04/07/2012

CLINICAL DATA: Code stroke, lethargy

EXAM:
CT HEAD WITHOUT CONTRAST
TECHNIQUE: Contiguous axial images were obtained from the base of the skull
through the vertex without intravenous contrast.

[Series 2: head 5.0 h30s · axial · 0.44mm/px · z∈[+815,+970]mm · 16 of 35 slices shown, 20 images]
[im 2/35  brain]
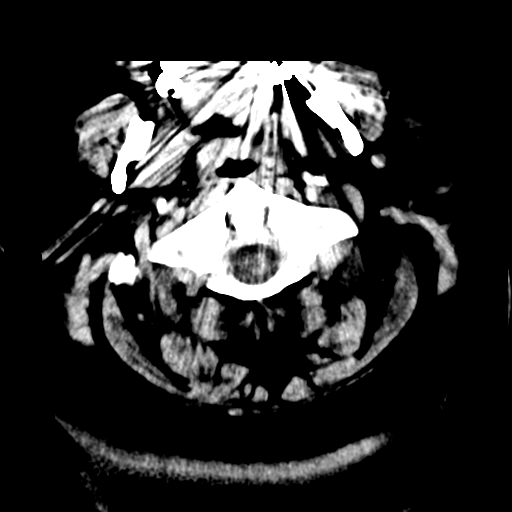
[im 2/35  bone]
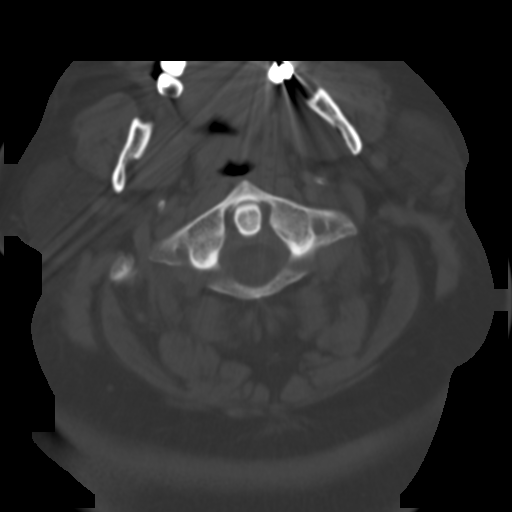
[im 4/35  brain]
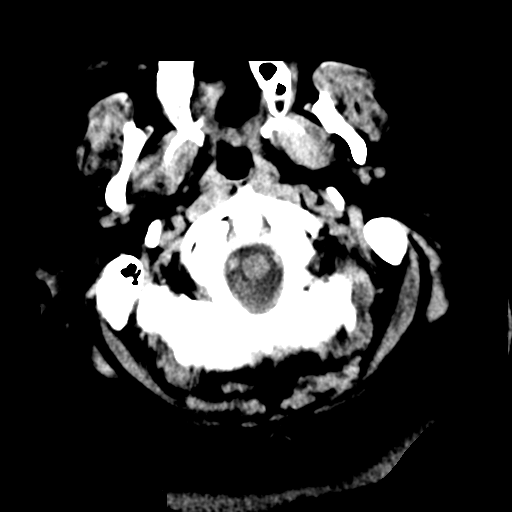
[im 6/35  brain]
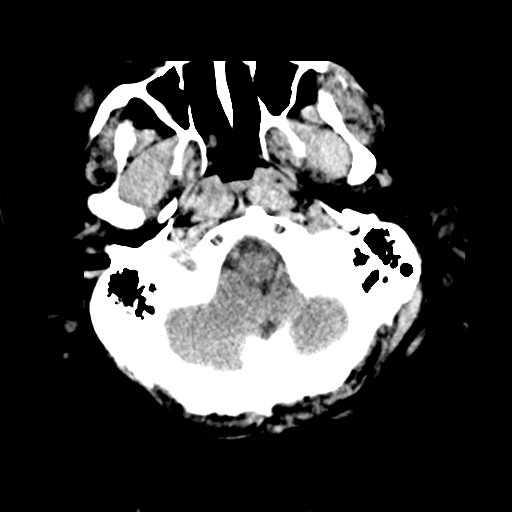
[im 9/35  brain]
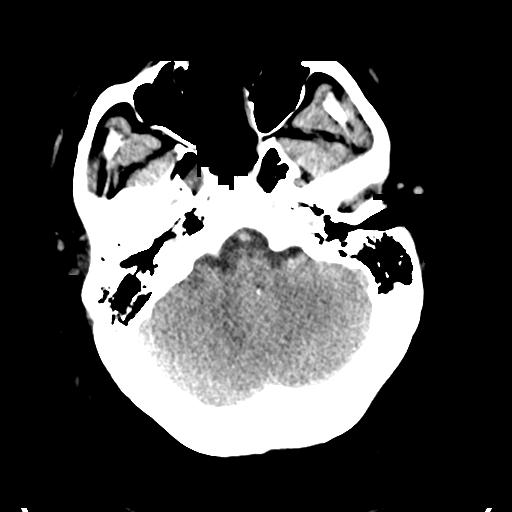
[im 10/35  brain]
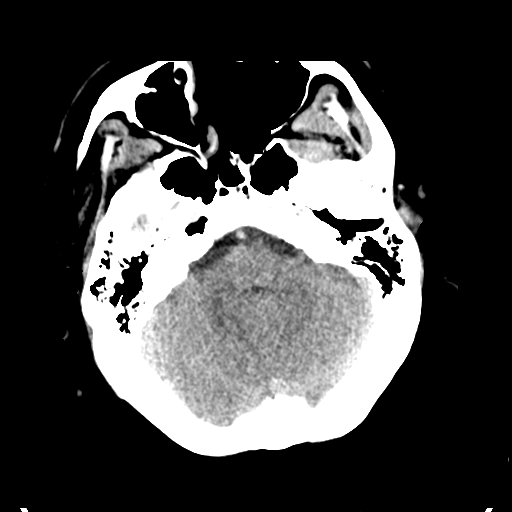
[im 10/35  bone]
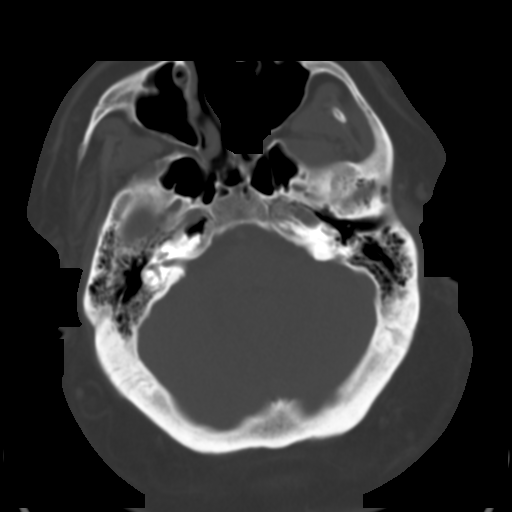
[im 12/35  brain]
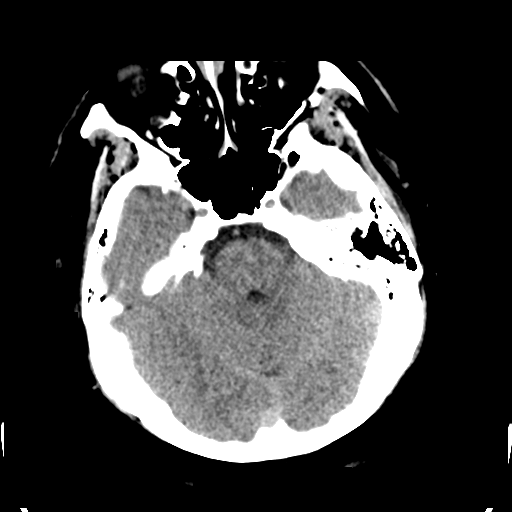
[im 15/35  brain]
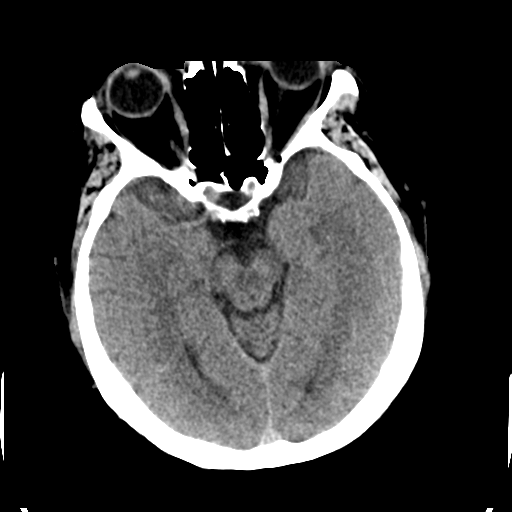
[im 17/35  brain]
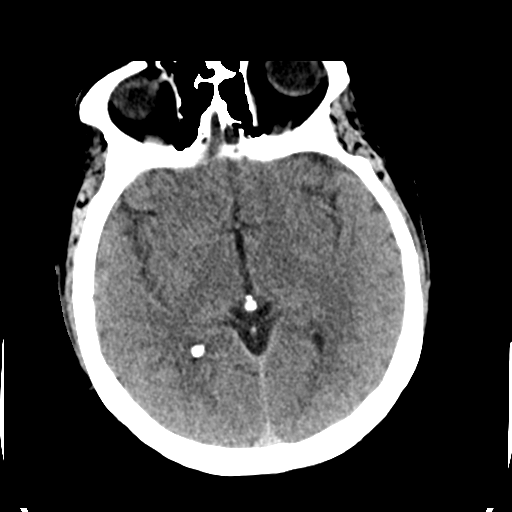
[im 18/35  brain]
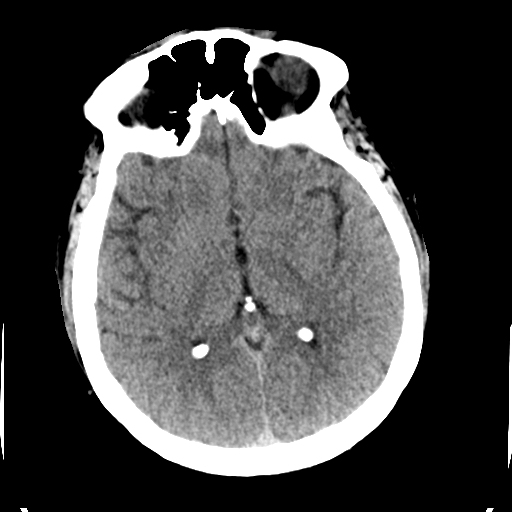
[im 18/35  bone]
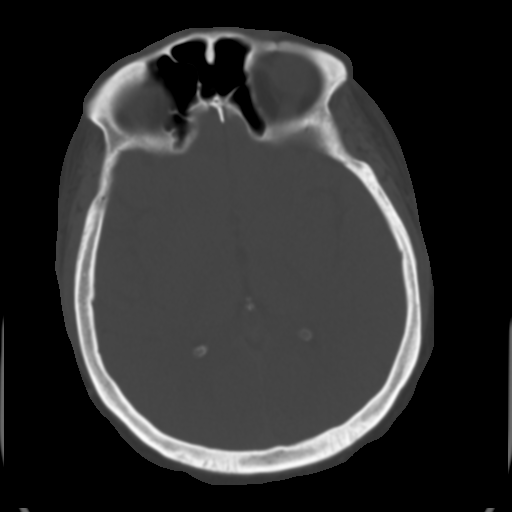
[im 20/35  brain]
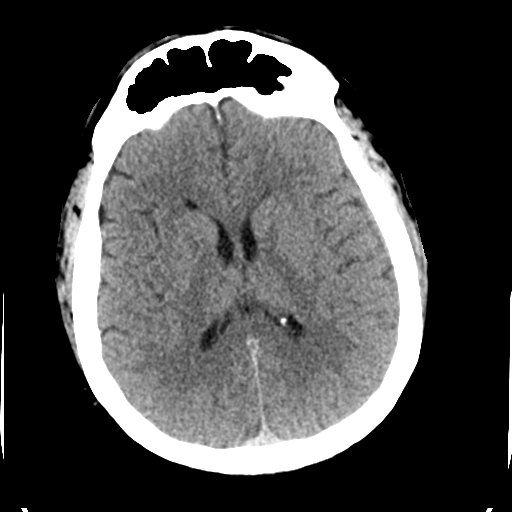
[im 23/35  brain]
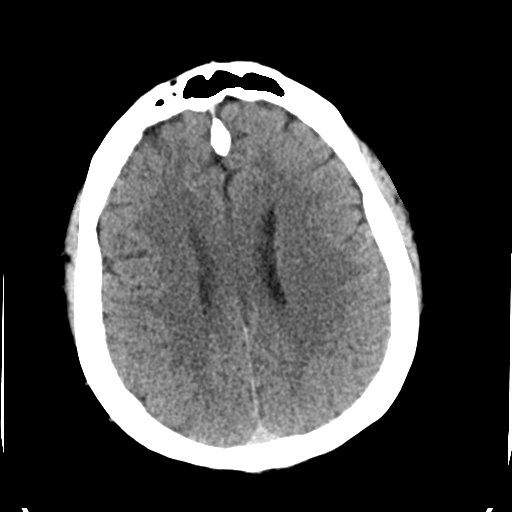
[im 25/35  brain]
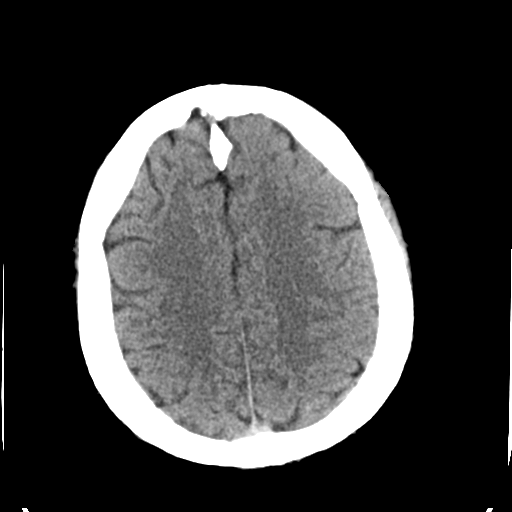
[im 26/35  brain]
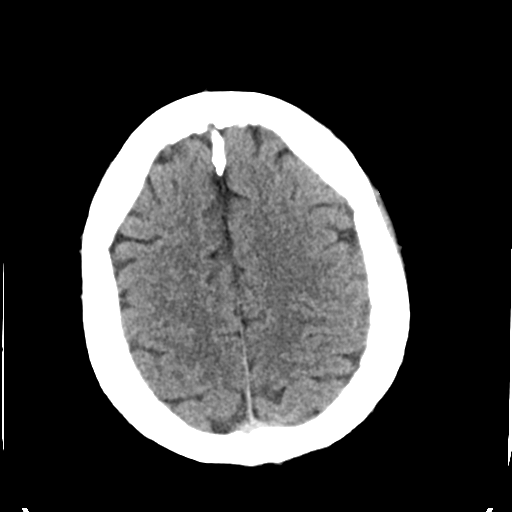
[im 26/35  bone]
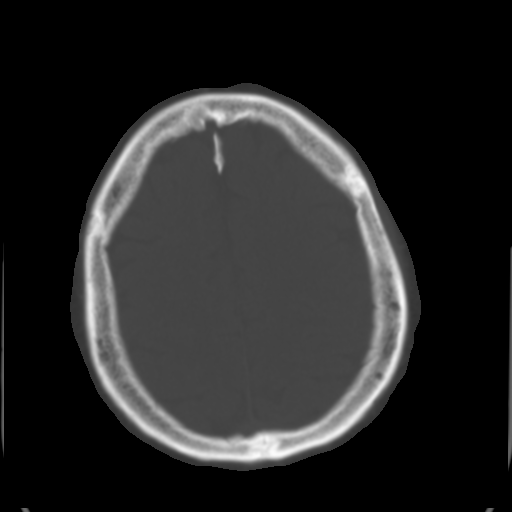
[im 29/35  brain]
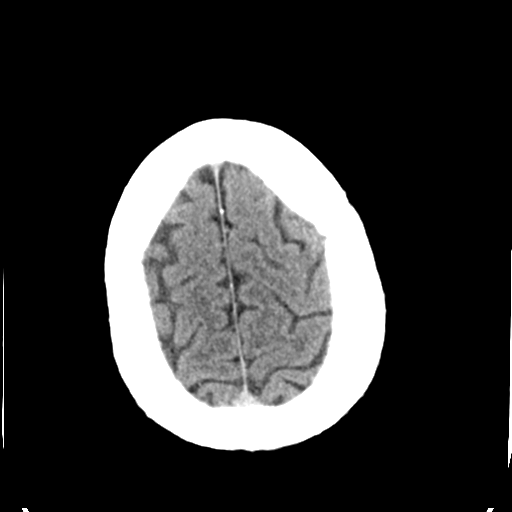
[im 31/35  brain]
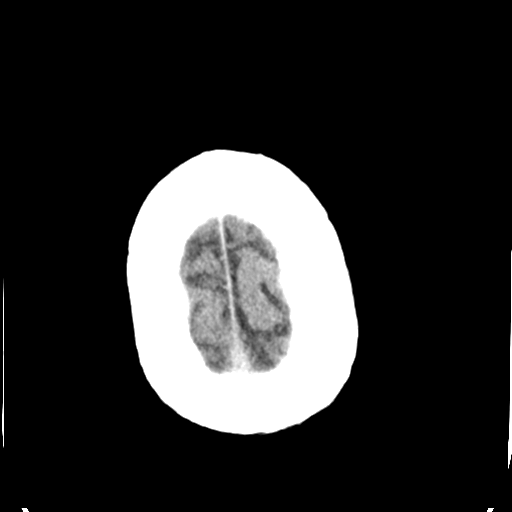
[im 33/35  brain]
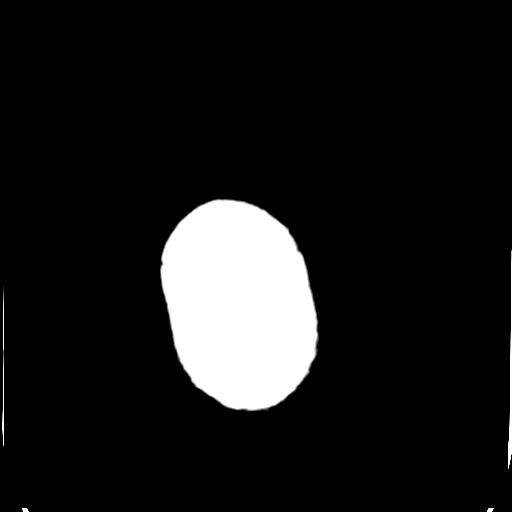

[16 of 30 positions shown; findings below may reference images not displayed]

FINDINGS: No evidence of parenchymal hemorrhage or extra-axial fluid
collection. No mass lesion, mass effect, or midline shift.

No CT evidence of acute infarction.

Cerebral volume is within normal limits.  No ventriculomegaly.

Visualized paranasal sinuses are essentially clear. Partial
opacification of the right mastoid air cells (series 3/image 15),
chronic.

No evidence of calvarial fracture.
IMPRESSION: No evidence of acute intracranial abnormality.

These results were called by telephone at the time of interpretation
on 08/06/2013 at [DATE] to Dr. BHADUR SAGHEER , who verbally
acknowledged these results.

## 2015-05-16 ENCOUNTER — Emergency Department (HOSPITAL_COMMUNITY): Payer: Medicare Other

## 2015-05-16 ENCOUNTER — Inpatient Hospital Stay (HOSPITAL_COMMUNITY)
Admission: EM | Admit: 2015-05-16 | Discharge: 2015-05-22 | DRG: 392 | Disposition: A | Discharge status: Home / Self Care | Payer: Medicare Other | Attending: Internal Medicine | Admitting: Internal Medicine

## 2015-05-16 DIAGNOSIS — R1013 Epigastric pain: Secondary | ICD-10-CM

## 2015-05-16 DIAGNOSIS — R7989 Other specified abnormal findings of blood chemistry: Secondary | ICD-10-CM | POA: Diagnosis present

## 2015-05-16 DIAGNOSIS — R1110 Vomiting, unspecified: Principal | ICD-10-CM

## 2015-05-16 DIAGNOSIS — K2100 Gastro-esophageal reflux disease with esophagitis, without bleeding: Secondary | ICD-10-CM | POA: Diagnosis present

## 2015-05-16 DIAGNOSIS — R1084 Generalized abdominal pain: Secondary | ICD-10-CM | POA: Diagnosis not present

## 2015-05-16 DIAGNOSIS — F1120 Opioid dependence, uncomplicated: Secondary | ICD-10-CM | POA: Diagnosis present

## 2015-05-16 DIAGNOSIS — R1011 Right upper quadrant pain: Secondary | ICD-10-CM

## 2015-05-16 DIAGNOSIS — G4733 Obstructive sleep apnea (adult) (pediatric): Secondary | ICD-10-CM | POA: Diagnosis present

## 2015-05-16 DIAGNOSIS — J45901 Unspecified asthma with (acute) exacerbation: Secondary | ICD-10-CM | POA: Diagnosis present

## 2015-05-16 DIAGNOSIS — E119 Type 2 diabetes mellitus without complications: Secondary | ICD-10-CM

## 2015-05-16 DIAGNOSIS — F1123 Opioid dependence with withdrawal: Secondary | ICD-10-CM | POA: Diagnosis present

## 2015-05-16 DIAGNOSIS — G8929 Other chronic pain: Secondary | ICD-10-CM | POA: Diagnosis present

## 2015-05-16 DIAGNOSIS — Z9989 Dependence on other enabling machines and devices: Secondary | ICD-10-CM

## 2015-05-16 DIAGNOSIS — Z794 Long term (current) use of insulin: Secondary | ICD-10-CM

## 2015-05-16 DIAGNOSIS — R109 Unspecified abdominal pain: Secondary | ICD-10-CM

## 2015-05-16 DIAGNOSIS — I10 Essential (primary) hypertension: Secondary | ICD-10-CM

## 2015-05-16 DIAGNOSIS — J4530 Mild persistent asthma, uncomplicated: Secondary | ICD-10-CM | POA: Diagnosis present

## 2015-05-16 DIAGNOSIS — K8020 Calculus of gallbladder without cholecystitis without obstruction: Secondary | ICD-10-CM | POA: Diagnosis present

## 2015-05-16 DIAGNOSIS — M549 Dorsalgia, unspecified: Secondary | ICD-10-CM

## 2015-05-16 LAB — COMPREHENSIVE METABOLIC PANEL
ALT: 138 U/L — ABNORMAL HIGH (ref 17–63)
AST: 98 U/L — AB (ref 15–41)
Albumin: 3.9 g/dL (ref 3.5–5.0)
Alkaline Phosphatase: 72 U/L (ref 38–126)
Anion gap: 8 (ref 5–15)
BUN: 13 mg/dL (ref 6–20)
CHLORIDE: 101 mmol/L (ref 101–111)
CO2: 29 mmol/L (ref 22–32)
Calcium: 9.2 mg/dL (ref 8.9–10.3)
Creatinine, Ser: 0.97 mg/dL (ref 0.61–1.24)
Glucose, Bld: 110 mg/dL — ABNORMAL HIGH (ref 65–99)
POTASSIUM: 4.7 mmol/L (ref 3.5–5.1)
Sodium: 138 mmol/L (ref 135–145)
Total Bilirubin: 0.9 mg/dL (ref 0.3–1.2)
Total Protein: 8 g/dL (ref 6.5–8.1)

## 2015-05-16 LAB — CBC
HEMATOCRIT: 47.9 % (ref 39.0–52.0)
HEMOGLOBIN: 15.6 g/dL (ref 13.0–17.0)
MCH: 31.5 pg (ref 26.0–34.0)
MCHC: 32.6 g/dL (ref 30.0–36.0)
MCV: 96.6 fL (ref 78.0–100.0)
Platelets: 224 10*3/uL (ref 150–400)
RBC: 4.96 MIL/uL (ref 4.22–5.81)
RDW: 13.3 % (ref 11.5–15.5)
WBC: 9.7 10*3/uL (ref 4.0–10.5)

## 2015-05-16 LAB — ABO/RH: ABO/RH(D): O NEG

## 2015-05-16 LAB — TYPE AND SCREEN
ABO/RH(D): O NEG
Antibody Screen: NEGATIVE

## 2015-05-16 LAB — I-STAT CG4 LACTIC ACID, ED
Lactic Acid, Venous: 0.82 mmol/L (ref 0.5–2.0)
Lactic Acid, Venous: 0.98 mmol/L (ref 0.5–2.0)

## 2015-05-16 LAB — OCCULT BLOOD GASTRIC / DUODENUM (SPECIMEN CUP)
OCCULT BLOOD, GASTRIC: NEGATIVE
PH, GASTRIC: 3

## 2015-05-16 LAB — LIPASE, BLOOD: Lipase: 25 U/L (ref 11–51)

## 2015-05-16 MED ORDER — HYDROMORPHONE HCL 1 MG/ML IJ SOLN
1.0000 mg | Freq: Once | INTRAMUSCULAR | Status: AC
  Administered 2015-05-16: 1 mg via INTRAVENOUS
  Filled 2015-05-16: qty 1

## 2015-05-16 MED ORDER — IOHEXOL 300 MG/ML  SOLN
25.0000 mL | Freq: Once | INTRAMUSCULAR | Status: AC | PRN
  Administered 2015-05-16: 25 mL via ORAL

## 2015-05-16 MED ORDER — SODIUM CHLORIDE 0.9 % IV SOLN
80.0000 mg | Freq: Once | INTRAVENOUS | Status: AC
  Administered 2015-05-16: 80 mg via INTRAVENOUS
  Filled 2015-05-16: qty 80

## 2015-05-16 MED ORDER — SODIUM CHLORIDE 0.9 % IV BOLUS (SEPSIS)
1000.0000 mL | Freq: Once | INTRAVENOUS | Status: AC
  Administered 2015-05-16: 1000 mL via INTRAVENOUS

## 2015-05-16 MED ORDER — ONDANSETRON HCL 4 MG/2ML IJ SOLN
4.0000 mg | Freq: Once | INTRAMUSCULAR | Status: AC
  Administered 2015-05-16: 4 mg via INTRAVENOUS
  Filled 2015-05-16: qty 2

## 2015-05-16 MED ORDER — IOHEXOL 300 MG/ML  SOLN
100.0000 mL | Freq: Once | INTRAMUSCULAR | Status: AC | PRN
  Administered 2015-05-16: 100 mL via INTRAVENOUS

## 2015-05-16 NOTE — ED Notes (Signed)
Per EMS pt c/o dark, dull red hematemesis x 2 onset today, n/v x several days 10/10 RUQ abdominal pain. No diarrhea, no blood in stool. No anticoagulants or antiplatelets.

## 2015-05-16 NOTE — Progress Notes (Signed)
Patient noted to have been seen in the ED 5 times with 4 admissions within the last six months.  EDCM went to speak to patient at bedside, however, IV team at bedside.

## 2015-05-16 NOTE — ED Provider Notes (Signed)
CSN: [id number]     Arrival date & time 05/16/15  1840 History   First MD Initiated Contact with Patient 05/16/15 1919     Chief Complaint  Patient presents with  . Hematemesis     (Consider location/radiation/quality/duration/timing/severity/associated sxs/prior Treatment) The history is provided by the patient and medical records. No language interpreter was used.     Bryan Gallegos is a 40 y.o. male  with a hx of HTN, NIDDM, asthma, obesity, heroin and benzodiazapine abuse presents to the Emergency Department complaining of gradual, persistent, progressively worsening right sided and epigastric abd pain onset yesterday morning.  Pt reports pain is constant and "tearing."  He reports chronic low back pain that is unchanged today.  He reports intermittent emesis onset 1 week ago without blood until this afternoon.  He reports the emesis was initially stomach contents only after eating, but has progressed to vomiting multiple times per hour in the last few days.  No diarrhea, melena or hematochezia.  Pt denies EtOH usage, NSAID usage, or previous history of GI bleeds. He does not take any anticoagulant or daily aspirin (though this has been prescribed).  No aggravating or alleviating factors. Patient reports pain 10/10 and unrelieved by position change.  Denies chest pain or shortness of breath. No fevers or chills, headache, neck pain, weakness or dizziness.  Past Medical History  Diagnosis Date  . Heart valve disorder     s/p echocardiogram  . Hypertension   . Asthma   . Diabetes mellitus   . Hyperlipidemia   . Anaphylactic reaction   . Bipolar disorder (HCC)   . Adult ADHD   . Morbidly obese (HCC)   . Transient cerebral ischemia     Unknown  . OSA (obstructive sleep apnea)   . Anxiety   . Complication of anesthesia     Per patient difficult intubation;  . Difficult intubation     Per patient   Past Surgical History  Procedure Laterality Date  . Carpal tunnel release    .  Nose surgery    . Wisdom tooth extraction    . Tooth extraction    . Esophagogastroduodenoscopy N/A 04/05/2015    Procedure: ESOPHAGOGASTRODUODENOSCOPY (EGD);  Surgeon: Malissa Hippo, MD;  Location: [facility];  Service: Endoscopy;  Laterality: N/A;   Family History  Problem Relation Age of Onset  . CAD Mother     Living  . Diabetes Mellitus II Mother   . Stroke Mother   . Hypertension Mother   . Congestive Heart Failure Mother   . Kidney disease Mother   . Fibromyalgia Mother   . Thyroid disease Mother   . Hyperlipidemia Mother   . Liver disease Mother   . Alcoholism Father 61    Deceased  . Arthritis Maternal Grandmother   . Congestive Heart Failure Maternal Grandmother   . Hypertension Maternal Grandmother   . Lung cancer Maternal Grandfather   . Colon cancer Maternal Aunt   . Stomach cancer Maternal Aunt   . Heart disease Other     Paternal & Maternal  . Hypertension Other     Paternal & Maternal  . Hypertension Brother     x3  . Hypertension Sister     #1  . Bipolar disorder Sister     #1  . ADD / ADHD Son     x3  . Bipolar disorder Son     x3  . Asperger's syndrome Son    Social History  Substance  Use Topics  . Smoking status: Current Every Day Smoker -- 1.00 packs/day for 23 years    Types: Cigarettes  . Smokeless tobacco: Never Used  . Alcohol Use: 0.0 oz/week    0 Standard drinks or equivalent per week     Comment: drank 7 beers on Thanksgiving Day 2016, last drank one yr before that    Review of Systems  Constitutional: Negative for fever, diaphoresis, appetite change, fatigue and unexpected weight change.  HENT: Negative for mouth sores.   Eyes: Negative for visual disturbance.  Respiratory: Negative for cough, chest tightness, shortness of breath and wheezing.   Cardiovascular: Negative for chest pain.  Gastrointestinal: Positive for nausea, vomiting and abdominal pain. Negative for diarrhea and constipation.       Hematemesis  Endocrine:  Negative for polydipsia, polyphagia and polyuria.  Genitourinary: Negative for dysuria, urgency, frequency and hematuria.  Musculoskeletal: Negative for back pain and neck stiffness.  Skin: Negative for rash.  Allergic/Immunologic: Negative for immunocompromised state.  Neurological: Negative for syncope, light-headedness and headaches.  Hematological: Does not bruise/bleed easily.  Psychiatric/Behavioral: Negative for sleep disturbance. The patient is not nervous/anxious.       Allergies  Atenolol; Other; and Tylenol  Home Medications   Prior to Admission medications   Medication Sig Start Date End Date Taking? Authorizing Provider  albuterol (PROVENTIL HFA;VENTOLIN HFA) 108 (90 Base) MCG/ACT inhaler Inhale 2 puffs into the lungs every 6 (six) hours as needed for wheezing or shortness of breath. 04/11/15  Yes Jimmy FootmanAndrea Hernandez-Gonzalez, MD  ALPRAZolam Prudy Feeler(XANAX) 1 MG tablet Take 1 mg by mouth 3 (three) times daily.   Yes Historical Provider, MD  aspirin 325 MG tablet Take 1 tablet (325 mg total) by mouth daily. 04/11/15  Yes Jimmy FootmanAndrea Hernandez-Gonzalez, MD  cloNIDine (CATAPRES) 0.2 MG tablet Take 0.2 mg by mouth 2 (two) times daily.   Yes Historical Provider, MD  dexlansoprazole (DEXILANT) 60 MG capsule Take 60 mg by mouth daily.   Yes Historical Provider, MD  furosemide (LASIX) 20 MG tablet Take 1 tablet (20 mg total) by mouth daily. 04/11/15  Yes Jimmy FootmanAndrea Hernandez-Gonzalez, MD  gabapentin (NEURONTIN) 400 MG capsule Take 2 capsules (800 mg total) by mouth 3 (three) times daily. 04/11/15  Yes Jimmy FootmanAndrea Hernandez-Gonzalez, MD  hydrOXYzine (VISTARIL) 50 MG capsule Take 1 capsule (50 mg total) by mouth 3 (three) times daily as needed for anxiety. 04/11/15  Yes Jimmy FootmanAndrea Hernandez-Gonzalez, MD  lisinopril (PRINIVIL,ZESTRIL) 40 MG tablet Take 1 tablet (40 mg total) by mouth 2 (two) times daily. 04/11/15  Yes Jimmy FootmanAndrea Hernandez-Gonzalez, MD  oxycodone (ROXICODONE) 30 MG immediate release tablet Take 30 mg by mouth  every 4 (four) hours as needed for pain.   Yes Historical Provider, MD  sitaGLIPtin-metformin (JANUMET) 50-1000 MG tablet Take 1 tablet by mouth 2 (two) times daily with a meal.   Yes Historical Provider, MD  zolpidem (AMBIEN) 10 MG tablet Take 10 mg by mouth at bedtime.   Yes Historical Provider, MD  linagliptin (TRADJENTA) 5 MG TABS tablet Take 1 tablet (5 mg total) by mouth daily before breakfast. Patient not taking: Reported on 04/11/2015 04/11/15   Jimmy FootmanAndrea Hernandez-Gonzalez, MD  metFORMIN (GLUCOPHAGE) 500 MG tablet Take 1 tablet (500 mg total) by mouth 2 (two) times daily with a meal. Patient not taking: Reported on 04/11/2015 04/11/15   Jimmy FootmanAndrea Hernandez-Gonzalez, MD  Naloxone HCl 4 MG/10ML SOLN Inject 4 mg as directed once as needed (in event of). 04/12/15   Hollice EspySendil K Krishnan, MD  omeprazole (PRILOSEC)  20 MG capsule Take 1 capsule (20 mg total) by mouth daily. Patient not taking: Reported on 04/11/2015 04/11/15   Jimmy Footman, MD  pravastatin (PRAVACHOL) 40 MG tablet Take 1 tablet (40 mg total) by mouth at bedtime. Patient not taking: Reported on 04/11/2015 04/11/15   Jimmy Footman, MD   BP 173/68 mmHg  Pulse 89  Temp(Src) 97.9 F (36.6 C) (Oral)  Resp 20  SpO2 96% Physical Exam  Constitutional: He appears well-developed and well-nourished. He appears distressed.  Awake, alert, nontoxic appearance Obese  HENT:  Head: Normocephalic and atraumatic.  Mouth/Throat: Oropharynx is clear and moist. No oropharyngeal exudate.  Eyes: Conjunctivae are normal. No scleral icterus.  Neck: Normal range of motion. Neck supple.  Cardiovascular: Normal rate, regular rhythm, normal heart sounds and intact distal pulses.   Pulmonary/Chest: Effort normal and breath sounds normal. No respiratory distress. He has no wheezes.  Equal chest expansion  Abdominal: Soft. Bowel sounds are normal. He exhibits no mass. There is tenderness. There is rebound and guarding.  Exam limited by obesity  of abdomen Abdomen is exquisitely tender throughout with guarding and rebound; no focal tenderness No palpable mass or audible bruit  Musculoskeletal: Normal range of motion. He exhibits no edema.  Neurological: He is alert.  Speech is clear and goal oriented Moves extremities without ataxia  Skin: Skin is warm and dry. He is not diaphoretic.  Psychiatric: He has a normal mood and affect.  Nursing note and vitals reviewed.   ED Course  Procedures (including critical care time) Labs Review Labs Reviewed  COMPREHENSIVE METABOLIC PANEL - Abnormal; Notable for the following:    Glucose, Bld 110 (*)    AST 98 (*)    ALT 138 (*)    All other components within normal limits  CBC  LIPASE, BLOOD  OCCULT BLOOD GASTRIC / DUODENUM (SPECIMEN CUP)  I-STAT CG4 LACTIC ACID, ED  I-STAT CG4 LACTIC ACID, ED  POC OCCULT BLOOD, ED  TYPE AND SCREEN  ABO/RH    Imaging Review Ct Abdomen Pelvis W Contrast  05/16/2015  CLINICAL DATA:  Right upper quadrant abdominal pain, hematemesis, nausea and vomiting. Onset today. EXAM: CT ABDOMEN AND PELVIS WITH CONTRAST TECHNIQUE: Multidetector CT imaging of the abdomen and pelvis was performed using the standard protocol following bolus administration of intravenous contrast. CONTRAST:  25mL OMNIPAQUE IOHEXOL 300 MG/ML SOLN, OMNIPAQUE IOHEXOL 300 MG/ML SOLN COMPARISON:  03/30/2015 FINDINGS: Lower chest: Multiple pulmonary nodules, the largest measuring 4.7 mm in the posterior right lower lobe based. This has been present over multiple prior studies dating back to at least 09/18/2008. Hepatobiliary: There are normal appearances of the liver, gallbladder and bile ducts. Pancreas: Normal Spleen: Normal Adrenals/Urinary Tract: The adrenals and kidneys are normal in appearance. There is no urinary calculus evident. There is no hydronephrosis or ureteral dilatation. Collecting systems and ureters appear unremarkable. Stomach/Bowel: There is a small hiatal hernia. The  appendix is normal. Small bowel and colon are unremarkable. Vascular/Lymphatic: The abdominal aorta is normal in caliber. There is no atherosclerotic calcification. There is no adenopathy in the abdomen or pelvis. Other: No acute inflammatory changes are evident in the abdomen or pelvis. There is no ascites. Musculoskeletal: Small fat containing umbilical hernia. No significant skeletal lesions. IMPRESSION: No acute findings are evident in the abdomen or pelvis. Stable pulmonary nodules in the lung bases. Small hiatal hernia. Small fat containing umbilical hernia. Electronically Signed   By: Ellery Plunk M.D.   On: 05/16/2015 22:36   I  have personally reviewed and evaluated these images and lab results as part of my medical decision-making.    MDM   Final diagnoses:  Intractable vomiting with nausea, vomiting of unspecified type  Generalized abdominal pain   Hennie Duos presents with c/o 1 week of vomiting, 2 days of abd pain and several hours or hematemsis.  Pt with multiple episodes of brown emesis Here in the emergency department in spite of Zofran and pain control.  Labs are reassuring. Gastroccult is negative. Patient is without anemia. Elevation of AST/ALT appears to be just slightly higher than baseline. Patient denies all usage.  Patient has been given multiple rounds of pain control and nausea medication with persistent vomiting.  Will need admission.  Discussed with Dr. Maryfrances Bunnell who will admit to Medsurg.    Dahlia Client Jaxxon Naeem, PA-C 05/17/15 0045  Vanetta Mulders, MD 05/17/15 [phone]

## 2015-05-16 NOTE — ED Notes (Signed)
Bed: WA03 Expected date:  Expected time:  Means of arrival:  Comments: EMS 39yo [place]

## 2015-05-17 ENCOUNTER — Observation Stay (HOSPITAL_COMMUNITY): Payer: Medicare Other

## 2015-05-17 ENCOUNTER — Encounter (HOSPITAL_COMMUNITY): Payer: Self-pay | Admitting: Family Medicine

## 2015-05-17 DIAGNOSIS — R1110 Vomiting, unspecified: Secondary | ICD-10-CM | POA: Diagnosis present

## 2015-05-17 DIAGNOSIS — K210 Gastro-esophageal reflux disease with esophagitis: Secondary | ICD-10-CM

## 2015-05-17 DIAGNOSIS — G4733 Obstructive sleep apnea (adult) (pediatric): Secondary | ICD-10-CM | POA: Diagnosis present

## 2015-05-17 DIAGNOSIS — J4530 Mild persistent asthma, uncomplicated: Secondary | ICD-10-CM | POA: Diagnosis present

## 2015-05-17 DIAGNOSIS — E119 Type 2 diabetes mellitus without complications: Secondary | ICD-10-CM | POA: Diagnosis present

## 2015-05-17 DIAGNOSIS — K8020 Calculus of gallbladder without cholecystitis without obstruction: Secondary | ICD-10-CM | POA: Diagnosis present

## 2015-05-17 DIAGNOSIS — R109 Unspecified abdominal pain: Secondary | ICD-10-CM | POA: Diagnosis present

## 2015-05-17 DIAGNOSIS — I10 Essential (primary) hypertension: Secondary | ICD-10-CM | POA: Diagnosis present

## 2015-05-17 DIAGNOSIS — F1910 Other psychoactive substance abuse, uncomplicated: Secondary | ICD-10-CM

## 2015-05-17 DIAGNOSIS — R1084 Generalized abdominal pain: Secondary | ICD-10-CM | POA: Diagnosis present

## 2015-05-17 DIAGNOSIS — R7989 Other specified abnormal findings of blood chemistry: Secondary | ICD-10-CM | POA: Diagnosis present

## 2015-05-17 DIAGNOSIS — G8929 Other chronic pain: Secondary | ICD-10-CM | POA: Diagnosis present

## 2015-05-17 DIAGNOSIS — F1123 Opioid dependence with withdrawal: Secondary | ICD-10-CM | POA: Diagnosis present

## 2015-05-17 LAB — COMPREHENSIVE METABOLIC PANEL
ALT: 121 U/L — ABNORMAL HIGH (ref 17–63)
ANION GAP: 8 (ref 5–15)
AST: 88 U/L — AB (ref 15–41)
Albumin: 3.4 g/dL — ABNORMAL LOW (ref 3.5–5.0)
Alkaline Phosphatase: 64 U/L (ref 38–126)
BILIRUBIN TOTAL: 1 mg/dL (ref 0.3–1.2)
BUN: 12 mg/dL (ref 6–20)
CALCIUM: 8.8 mg/dL — AB (ref 8.9–10.3)
CO2: 30 mmol/L (ref 22–32)
Chloride: 100 mmol/L — ABNORMAL LOW (ref 101–111)
Creatinine, Ser: 0.89 mg/dL (ref 0.61–1.24)
GFR calc Af Amer: >60 mL/min (ref >60)
Glucose, Bld: 104 mg/dL — ABNORMAL HIGH (ref 65–99)
POTASSIUM: 4.3 mmol/L (ref 3.5–5.1)
Sodium: 138 mmol/L (ref 135–145)
TOTAL PROTEIN: 7.2 g/dL (ref 6.5–8.1)

## 2015-05-17 LAB — CBC
HEMATOCRIT: 42.2 % (ref 39.0–52.0)
Hemoglobin: 14.3 g/dL (ref 13.0–17.0)
MCH: 31.4 pg (ref 26.0–34.0)
MCHC: 33.9 g/dL (ref 30.0–36.0)
MCV: 92.5 fL (ref 78.0–100.0)
Platelets: 191 10*3/uL (ref 150–400)
RBC: 4.56 MIL/uL (ref 4.22–5.81)
RDW: 13 % (ref 11.5–15.5)
WBC: 8 10*3/uL (ref 4.0–10.5)

## 2015-05-17 LAB — GLUCOSE, CAPILLARY
GLUCOSE-CAPILLARY: 94 mg/dL (ref 65–99)
GLUCOSE-CAPILLARY: 156 mg/dL — AB (ref 65–99)
GLUCOSE-CAPILLARY: 93 mg/dL (ref 65–99)

## 2015-05-17 MED ORDER — OXYCODONE HCL 5 MG PO TABS
5.0000 mg | ORAL_TABLET | ORAL | Status: DC | PRN
  Administered 2015-05-17 – 2015-05-18 (×4): 5 mg via ORAL
  Filled 2015-05-17 (×4): qty 1

## 2015-05-17 MED ORDER — ONDANSETRON HCL 4 MG/2ML IJ SOLN: 4.0000 mg | Freq: Three times a day (TID) | INTRAMUSCULAR | Status: DC | PRN

## 2015-05-17 MED ORDER — PROCHLORPERAZINE EDISYLATE 5 MG/ML IJ SOLN: 10.0000 mg | Freq: Four times a day (QID) | INTRAMUSCULAR | Status: DC | PRN

## 2015-05-17 MED ORDER — HYDROMORPHONE HCL 1 MG/ML IJ SOLN
1.0000 mg | Freq: Once | INTRAMUSCULAR | Status: AC
  Administered 2015-05-17: 1 mg via INTRAVENOUS
  Filled 2015-05-17: qty 1

## 2015-05-17 MED ORDER — INSULIN ASPART 100 UNIT/ML ~~LOC~~ SOLN: 0.0000 [IU] | Freq: Every day | SUBCUTANEOUS | Status: DC

## 2015-05-17 MED ORDER — ENOXAPARIN SODIUM 40 MG/0.4ML ~~LOC~~ SOLN
40.0000 mg | SUBCUTANEOUS | Status: DC
  Filled 2015-05-17: qty 0.4

## 2015-05-17 MED ORDER — DIPHENHYDRAMINE HCL 50 MG/ML IJ SOLN
12.5000 mg | Freq: Once | INTRAMUSCULAR | Status: AC
  Administered 2015-05-17: 12.5 mg via INTRAVENOUS
  Filled 2015-05-17: qty 1

## 2015-05-17 MED ORDER — SODIUM CHLORIDE 0.9% FLUSH
10.0000 mL | Freq: Two times a day (BID) | INTRAVENOUS | Status: DC
  Administered 2015-05-19: 10 mL

## 2015-05-17 MED ORDER — ALBUTEROL SULFATE (2.5 MG/3ML) 0.083% IN NEBU: 2.5000 mg | INHALATION_SOLUTION | Freq: Four times a day (QID) | RESPIRATORY_TRACT | Status: DC | PRN

## 2015-05-17 MED ORDER — SODIUM CHLORIDE 0.9 % IV SOLN
INTRAVENOUS | Status: AC
  Administered 2015-05-17 – 2015-05-19 (×5): via INTRAVENOUS

## 2015-05-17 MED ORDER — HYDROMORPHONE HCL 1 MG/ML IJ SOLN: 1.0000 mg | INTRAMUSCULAR | Status: DC | PRN

## 2015-05-17 MED ORDER — ALPRAZOLAM 1 MG PO TABS
1.0000 mg | ORAL_TABLET | Freq: Three times a day (TID) | ORAL | Status: DC | PRN
  Administered 2015-05-17 – 2015-05-22 (×13): 1 mg via ORAL
  Filled 2015-05-17 (×13): qty 1

## 2015-05-17 MED ORDER — DICYCLOMINE HCL 10 MG PO CAPS
10.0000 mg | ORAL_CAPSULE | Freq: Three times a day (TID) | ORAL | Status: DC
  Administered 2015-05-17 – 2015-05-22 (×18): 10 mg via ORAL
  Filled 2015-05-17 (×25): qty 1

## 2015-05-17 MED ORDER — PANTOPRAZOLE SODIUM 40 MG IV SOLR
40.0000 mg | Freq: Two times a day (BID) | INTRAVENOUS | Status: DC
Start: 2015-05-17 — End: 2015-05-21
  Administered 2015-05-17 – 2015-05-21 (×8): 40 mg via INTRAVENOUS
  Filled 2015-05-17 (×9): qty 40

## 2015-05-17 MED ORDER — HYDROXYZINE HCL 25 MG PO TABS
50.0000 mg | ORAL_TABLET | Freq: Three times a day (TID) | ORAL | Status: DC | PRN
  Administered 2015-05-17: 50 mg via ORAL
  Filled 2015-05-17: qty 2

## 2015-05-17 MED ORDER — INSULIN ASPART 100 UNIT/ML ~~LOC~~ SOLN
0.0000 [IU] | Freq: Three times a day (TID) | SUBCUTANEOUS | Status: DC
  Administered 2015-05-17: 2 [IU] via SUBCUTANEOUS
  Administered 2015-05-19 (×2): 3 [IU] via SUBCUTANEOUS
  Administered 2015-05-19: 09:00:00 via SUBCUTANEOUS
  Administered 2015-05-20: 3 [IU] via SUBCUTANEOUS
  Administered 2015-05-20: 1 [IU] via SUBCUTANEOUS
  Administered 2015-05-20: 3 [IU] via SUBCUTANEOUS
  Administered 2015-05-21 – 2015-05-22 (×2): 2 [IU] via SUBCUTANEOUS

## 2015-05-17 MED ORDER — MORPHINE SULFATE (PF) 2 MG/ML IV SOLN
2.0000 mg | INTRAVENOUS | Status: DC | PRN
  Administered 2015-05-17 – 2015-05-21 (×25): 2 mg via INTRAVENOUS
  Filled 2015-05-17 (×26): qty 1

## 2015-05-17 MED ORDER — MORPHINE SULFATE (PF) 2 MG/ML IV SOLN
INTRAVENOUS | Status: AC
  Filled 2015-05-17: qty 1

## 2015-05-17 MED ORDER — LISINOPRIL 40 MG PO TABS
40.0000 mg | ORAL_TABLET | Freq: Two times a day (BID) | ORAL | Status: DC
  Administered 2015-05-17 – 2015-05-21 (×9): 40 mg via ORAL
  Filled 2015-05-17 (×14): qty 1

## 2015-05-17 MED ORDER — LORAZEPAM 2 MG/ML IJ SOLN
1.0000 mg | Freq: Once | INTRAMUSCULAR | Status: AC
  Administered 2015-05-17: 1 mg via INTRAVENOUS
  Filled 2015-05-17: qty 1

## 2015-05-17 MED ORDER — ALBUTEROL SULFATE HFA 108 (90 BASE) MCG/ACT IN AERS: 2.0000 | INHALATION_SPRAY | Freq: Four times a day (QID) | RESPIRATORY_TRACT | Status: DC | PRN

## 2015-05-17 MED ORDER — METOCLOPRAMIDE HCL 5 MG/ML IJ SOLN
10.0000 mg | Freq: Once | INTRAMUSCULAR | Status: AC
  Administered 2015-05-17: 10 mg via INTRAVENOUS
  Filled 2015-05-17: qty 2

## 2015-05-17 MED ORDER — SODIUM CHLORIDE 0.9% FLUSH
10.0000 mL | INTRAVENOUS | Status: DC | PRN
  Administered 2015-05-22: 10 mL
  Filled 2015-05-17: qty 40

## 2015-05-17 MED ORDER — ONDANSETRON HCL 4 MG/2ML IJ SOLN
4.0000 mg | Freq: Four times a day (QID) | INTRAMUSCULAR | Status: DC | PRN
  Administered 2015-05-17 – 2015-05-19 (×3): 4 mg via INTRAVENOUS
  Filled 2015-05-17 (×2): qty 2

## 2015-05-17 MED ORDER — GABAPENTIN 400 MG PO CAPS
800.0000 mg | ORAL_CAPSULE | Freq: Three times a day (TID) | ORAL | Status: DC
  Administered 2015-05-17 – 2015-05-21 (×13): 800 mg via ORAL
  Filled 2015-05-17 (×18): qty 2

## 2015-05-17 MED ORDER — PANTOPRAZOLE SODIUM 40 MG PO TBEC
40.0000 mg | DELAYED_RELEASE_TABLET | Freq: Every day | ORAL | Status: DC
  Administered 2015-05-17: 40 mg via ORAL
  Filled 2015-05-17: qty 1

## 2015-05-17 MED ORDER — ONDANSETRON HCL 4 MG PO TABS: 8.0000 mg | ORAL_TABLET | Freq: Three times a day (TID) | ORAL | Status: DC | PRN

## 2015-05-17 NOTE — Progress Notes (Signed)
Peripherally Inserted Central Catheter/Midline Placement  The IV Nurse has discussed with the patient and/or persons authorized to consent for the patient, the purpose of this procedure and the potential benefits and risks involved with this procedure.  The benefits include less needle sticks, lab draws from the catheter and patient may be discharged home with the catheter.  Risks include, but not limited to, infection, bleeding, blood clot (thrombus formation), and puncture of an artery; nerve damage and irregular heat beat.  Alternatives to this procedure were also discussed.  PICC/Midline Placement Documentation  PICC Single Lumen 05/17/15 PICC Right Cephalic 45 cm 0 cm (Active)  Indication for Insertion or Continuance of Line Poor Vasculature-patient has had multiple peripheral attempts or PIVs lasting less than 24 hours 05/17/2015  6:19 PM  Exposed Catheter (cm) 0 cm 05/17/2015  6:19 PM  Site Assessment Clean;Dry;Intact 05/17/2015  6:19 PM  Line Status Flushed;Saline locked;Blood return noted 05/17/2015  6:19 PM  Dressing Type Transparent 05/17/2015  6:19 PM  Dressing Status Clean;Dry;Intact 05/17/2015  6:19 PM  Line Adjustment (NICU/IV Team Only) No 05/17/2015  6:19 PM  Dressing Intervention New dressing 05/17/2015  6:19 PM  Dressing Change Due 05/24/15 05/17/2015  6:19 PM       Ethelda Chickurrie, Shivali Quackenbush Robert 05/17/2015, 6:21 PM

## 2015-05-17 NOTE — H&P (Signed)
History and Physical  Patient Name: Bryan Gallegos     ZOX:[id number]    DOB: Oct 14, 1975    DOA: 05/16/2015 Referring physician: Dierdre Forth, PA-C PCP: No PCP Per Patient      Chief Complaint: Vomiting and malaise,  HPI: Bryan Gallegos is a 40 y.o. male with a past medical history significant for NIDDM, HTN, obesity, and opioid/heroin dependence who presents with nausea vomiting.  The patient says that he was in his usual state of health until about one week ago when he started to vomit, this continued all week, until 2 days ago he started to have abdominal pain. This is cramping, right-sided abdominal pain, worse after eating greasy food, not associated with fever, hematemesis, diarrhea, hematochezia, or melena. This pain persisted and so he came to the ED.  In the ED, the patient was afebrile, hemodynamically stable, hypertensive.  Na 138, K4.7, crit 1.0, AST and ALT chronically elevated about 3 times the upper limit of normal, lipase normal, lactate normal, vomiting blood testing negative, WBC 9K, hemoglobin 15. CT of the abdomen and pelvis with contrast was unremarkable.  The patient notes that his son had gallbladder pain in the past, and says this seems similar. He also notes that he takes oxycodone 30 mg 3-6 times daily for chronic low back and knee pain, that is prescribed by an urgent care in [facility], but that he has not been taking any of this for the last week.  Chart review shows that he overdosed on heroin 6 weeks ago at this hospital.  He was seen by psychiatry, who noted that he deflected from his drug use, and was possibly malingering.     Review of Systems:  Pt complains of nausea, vomiting, tremor, anxiety, malaise, abdominal pain. All other systems negative except as just noted or noted in the history of present illness.  Allergies  Allergen Reactions  . Atenolol Anaphylaxis  . Other Anaphylaxis    "Prednisone like drugs"  . Tylenol [Acetaminophen]  Other (See Comments)    GI Bleed    Prior to Admission medications   Medication Sig Start Date End Date Taking? Authorizing Provider  albuterol (PROVENTIL HFA;VENTOLIN HFA) 108 (90 Base) MCG/ACT inhaler Inhale 2 puffs into the lungs every 6 (six) hours as needed for wheezing or shortness of breath. 04/11/15  Yes Jimmy Footman, MD  ALPRAZolam Prudy Feeler) 1 MG tablet Take 1 mg by mouth 3 (three) times daily.   Yes Historical Provider, MD  aspirin 325 MG tablet Take 1 tablet (325 mg total) by mouth daily. 04/11/15  Yes Jimmy Footman, MD  cloNIDine (CATAPRES) 0.2 MG tablet Take 0.2 mg by mouth 2 (two) times daily.   Yes Historical Provider, MD  dexlansoprazole (DEXILANT) 60 MG capsule Take 60 mg by mouth daily.   Yes Historical Provider, MD  furosemide (LASIX) 20 MG tablet Take 1 tablet (20 mg total) by mouth daily. 04/11/15  Yes Jimmy Footman, MD  gabapentin (NEURONTIN) 400 MG capsule Take 2 capsules (800 mg total) by mouth 3 (three) times daily. 04/11/15  Yes Jimmy Footman, MD  hydrOXYzine (VISTARIL) 50 MG capsule Take 1 capsule (50 mg total) by mouth 3 (three) times daily as needed for anxiety. 04/11/15  Yes Jimmy Footman, MD  lisinopril (PRINIVIL,ZESTRIL) 40 MG tablet Take 1 tablet (40 mg total) by mouth 2 (two) times daily. 04/11/15  Yes Jimmy Footman, MD  oxycodone (ROXICODONE) 30 MG immediate release tablet Take 30 mg by mouth every 4 (four) hours as needed  for pain.   Yes Historical Provider, MD  sitaGLIPtin-metformin (JANUMET) 50-1000 MG tablet Take 1 tablet by mouth 2 (two) times daily with a meal.   Yes Historical Provider, MD  zolpidem (AMBIEN) 10 MG tablet Take 10 mg by mouth at bedtime.   Yes Historical Provider, MD  linagliptin (TRADJENTA) 5 MG TABS tablet Take 1 tablet (5 mg total) by mouth daily before breakfast. Patient not taking: Reported on 04/11/2015 04/11/15   Jimmy FootmanAndrea Hernandez-Gonzalez, MD  metFORMIN (GLUCOPHAGE) 500  MG tablet Take 1 tablet (500 mg total) by mouth 2 (two) times daily with a meal. Patient not taking: Reported on 04/11/2015 04/11/15   Jimmy FootmanAndrea Hernandez-Gonzalez, MD  Naloxone HCl 4 MG/10ML SOLN Inject 4 mg as directed once as needed (in event of). 04/12/15   Hollice EspySendil K Krishnan, MD  omeprazole (PRILOSEC) 20 MG capsule Take 1 capsule (20 mg total) by mouth daily. Patient not taking: Reported on 04/11/2015 04/11/15   Jimmy FootmanAndrea Hernandez-Gonzalez, MD  pravastatin (PRAVACHOL) 40 MG tablet Take 1 tablet (40 mg total) by mouth at bedtime. Patient not taking: Reported on 04/11/2015 04/11/15   Jimmy FootmanAndrea Hernandez-Gonzalez, MD    Past Medical History  Diagnosis Date  . Heart valve disorder     s/p echocardiogram  . Hypertension   . Asthma   . Diabetes mellitus   . Hyperlipidemia   . Anaphylactic reaction   . Bipolar disorder (HCC)   . Adult ADHD   . Morbidly obese (HCC)   . Transient cerebral ischemia     Unknown  . OSA (obstructive sleep apnea)   . Anxiety   . Complication of anesthesia     Per patient difficult intubation;  . Difficult intubation     Per patient    Past Surgical History  Procedure Laterality Date  . Carpal tunnel release    . Nose surgery    . Wisdom tooth extraction    . Tooth extraction    . Esophagogastroduodenoscopy N/A 04/05/2015    Procedure: ESOPHAGOGASTRODUODENOSCOPY (EGD);  Surgeon: Malissa HippoNajeeb U Rehman, MD;  Location: [facility];  Service: Endoscopy;  Laterality: N/A;    Family history: family history includes ADD / ADHD in his son; Alcoholism (age of onset: 4635) in his father; Arthritis in his maternal grandmother; Asperger's syndrome in his son; Bipolar disorder in his sister and son; CAD in his mother; Colon cancer in his maternal aunt; Congestive Heart Failure in his maternal grandmother and mother; Diabetes Mellitus II in his mother; Fibromyalgia in his mother; Heart disease in his other; Hyperlipidemia in his mother; Hypertension in his brother, maternal grandmother,  mother, other, and sister; Kidney disease in his mother; Liver disease in his mother; Lung cancer in his maternal grandfather; Stomach cancer in his maternal aunt; Stroke in his mother; Thyroid disease in his mother.  Social History: Patient lives With his wife. He reports smoking. He is on disability.       Physical Exam: BP 148/104 mmHg  Pulse 81  Temp(Src) 97.9 F (36.6 C) (Oral)  Resp 16  SpO2 95% General appearance: Obese adult male, alert and in moderate distress from discomfort and anxiety, sitting rigidly in bed, eyes closed, anxious.   Eyes: Anicteric, conjunctiva pink, lids and lashes normal.    Pupils dilated. ENT: No nasal deformity, discharge, or epistaxis.  OP tacky dry without lesions.   Skin: Warm and flushed.  No jaundice. Cardiac: RRR, nl S1-S2, no murmurs appreciated, heart sounds distant.  No LE edema.  Radial and DP pulses 2+  and symmetric. Respiratory: Normal respiratory rate and rhythm.  CTAB without rales or wheezes. Abdomen: Abdomen soft without rigidity.  Moderate guarding and TTP in RUQ, Murphy's sign equivocal.  No rebound.  MSK: No deformities or effusions. Neuro: Anxious.  Fine tremor.  Sensorium intact and responding to questions, attention distracted by discomfort.  Speech is fluent.  Moves all extremities equally and with normal coordination.    Psych: Behavior agitated.  Affect anxious.  No evidence of aural or visual hallucinations or delusions.       Labs on Admission:  The metabolic panel shows normal lites and renal function. AST and ALT are 98 and 138. Total bilirubin is normal. Lipase normal. Lactate normal. Occult blood testing on vomit is negative. The complete blood count shows no leukocytosis, anemia, thrombocytopenia.   Radiological Exams on Admission: Ct Abdomen Pelvis W Contrast  05/16/2015  CLINICAL DATA:  Right upper quadrant abdominal pain, hematemesis, nausea and vomiting. Onset today. EXAM: CT ABDOMEN AND PELVIS WITH CONTRAST  TECHNIQUE: Multidetector CT imaging of the abdomen and pelvis was performed using the standard protocol following bolus administration of intravenous contrast. CONTRAST:  25mL OMNIPAQUE IOHEXOL 300 MG/ML SOLN, OMNIPAQUE IOHEXOL 300 MG/ML SOLN COMPARISON:  03/30/2015 FINDINGS: Lower chest: Multiple pulmonary nodules, the largest measuring 4.7 mm in the posterior right lower lobe based. This has been present over multiple prior studies dating back to at least 09/18/2008. Hepatobiliary: There are normal appearances of the liver, gallbladder and bile ducts. Pancreas: Normal Spleen: Normal Adrenals/Urinary Tract: The adrenals and kidneys are normal in appearance. There is no urinary calculus evident. There is no hydronephrosis or ureteral dilatation. Collecting systems and ureters appear unremarkable. Stomach/Bowel: There is a small hiatal hernia. The appendix is normal. Small bowel and colon are unremarkable. Vascular/Lymphatic: The abdominal aorta is normal in caliber. There is no atherosclerotic calcification. There is no adenopathy in the abdomen or pelvis. Other: No acute inflammatory changes are evident in the abdomen or pelvis. There is no ascites. Musculoskeletal: Small fat containing umbilical hernia. No significant skeletal lesions. IMPRESSION: No acute findings are evident in the abdomen or pelvis. Stable pulmonary nodules in the lung bases. Small hiatal hernia. Small fat containing umbilical hernia. Electronically Signed   By: Ellery Plunk M.D.   On: 05/16/2015 22:36    EKG: Independently reviewed. Rate 81, sinus rhythm, QTC 450. ST segments normal. Right axis deviation.    Assessment/Plan 1. Nausea/vomiting, suspect opiate withdrawal:  This is new.  He has nausea/vomiting, abdominal cramps, tremors, and anxiety, overall which seem most like opiate withdrawal.  Will rule out gallstones as below.  Treat symptomatically.   -MIVF -Ondansetron 4 mg IV q8hrs PRN for nausea -Compazine 10 mg  q6hrs PRN -Hydroxyzine 50 mg q8hrs PRN for anxiety -Dicyclomine 10 mg q6hrs PRN for abdominal cramps -No narcotics -Consult to Psychiatry or social work for drug rehab therapy if desired   2. Elevated transaminases:  This is chronic, slightly worse.  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  Denies alcohol.  Bilirubin normal, gallstone disease doubted but will rule out -RUQ [place] -Trend LFT tomorrow  3. HTN:  -Continue home lisinopril -Hold furosemide while on fluids  4. NIDDM:  -Hold metformin while inpatient -SSI  5. Asthma, intermittent:  -PRN albuterol      DVT PPx: Lovenox Diet: Diabetic Consultants: None Code Status: FULL Family Communication: Wife at bedside  Medical decision making: What exists of the patient's previous chart was reviewed in depth and the case was discussed with Dahlia Client  Muthersbaugh, PA-C. Patient seen 1:11 AM on 05/17/2015.  Disposition Plan:  I recommend admission to med surg, observation status.  Clinical condition: stable.  Anticipate fluids and treatment of withdrawal.  RUQ [place].  Disposition pending ultrasound results and symptoms tomrorow.  If symptoms improved, home tomorrow.      Alberteen Sam [facility] Pager [phone]

## 2015-05-17 NOTE — Progress Notes (Signed)
I've seen and examined [name] at bedside in the presence of his wife and reviewed his chart. He was admitted earlier today by Dr. Marylene Landunford. Please refer to Dr. Cresenciano Genreunford's comprehensive H&P for detailed care plan. Su HoffWillard Silvio is a pleasant 40 year old male with morbid obesity/type 2 diabetes mellitus/essential hypertension/opioid dependency/benzodiazepine dependency, who came in with right upper quadrant abdominal pain especially with food and he is found to have cholelithiasis(shown on ultrasound) with transaminitis therefore question of gallbladder colic. Will obtain HIDA scan and consult general surgery. He has lost IV access hence we will request picc line meanwhile.

## 2015-05-18 ENCOUNTER — Inpatient Hospital Stay (HOSPITAL_COMMUNITY): Payer: Medicare Other

## 2015-05-18 LAB — GLUCOSE, CAPILLARY
GLUCOSE-CAPILLARY: 126 mg/dL — AB (ref 65–99)
Glucose-Capillary: 112 mg/dL — ABNORMAL HIGH (ref 65–99)
Glucose-Capillary: 123 mg/dL — ABNORMAL HIGH (ref 65–99)
Glucose-Capillary: 155 mg/dL — ABNORMAL HIGH (ref 65–99)

## 2015-05-18 LAB — CBC WITH DIFFERENTIAL/PLATELET
BASOS ABS: 0 10*3/uL (ref 0.0–0.1)
BASOS PCT: 0 %
EOS ABS: 0.2 10*3/uL (ref 0.0–0.7)
EOS PCT: 3 %
HEMATOCRIT: 42.2 % (ref 39.0–52.0)
Hemoglobin: 13.7 g/dL (ref 13.0–17.0)
Lymphocytes Relative: 34 %
Lymphs Abs: 2.3 10*3/uL (ref 0.7–4.0)
MCH: 31.2 pg (ref 26.0–34.0)
MCHC: 32.5 g/dL (ref 30.0–36.0)
MCV: 96.1 fL (ref 78.0–100.0)
MONO ABS: 0.6 10*3/uL (ref 0.1–1.0)
Monocytes Relative: 8 %
NEUTROS ABS: 3.8 10*3/uL (ref 1.7–7.7)
Neutrophils Relative %: 55 %
PLATELETS: 157 10*3/uL (ref 150–400)
RBC: 4.39 MIL/uL (ref 4.22–5.81)
RDW: 13.1 % (ref 11.5–15.5)
WBC: 6.9 10*3/uL (ref 4.0–10.5)

## 2015-05-18 LAB — HEPATIC FUNCTION PANEL
ALBUMIN: 3.2 g/dL — AB (ref 3.5–5.0)
ALT: 92 U/L — ABNORMAL HIGH (ref 17–63)
AST: 57 U/L — AB (ref 15–41)
Alkaline Phosphatase: 60 U/L (ref 38–126)
Bilirubin, Direct: 0.2 mg/dL (ref 0.1–0.5)
Indirect Bilirubin: 0.6 mg/dL (ref 0.3–0.9)
TOTAL PROTEIN: 7 g/dL (ref 6.5–8.1)
Total Bilirubin: 0.8 mg/dL (ref 0.3–1.2)

## 2015-05-18 LAB — BASIC METABOLIC PANEL
ANION GAP: 8 (ref 5–15)
BUN: 12 mg/dL (ref 6–20)
CALCIUM: 8.5 mg/dL — AB (ref 8.9–10.3)
CO2: 27 mmol/L (ref 22–32)
CREATININE: 0.86 mg/dL (ref 0.61–1.24)
Chloride: 104 mmol/L (ref 101–111)
Glucose, Bld: 134 mg/dL — ABNORMAL HIGH (ref 65–99)
Potassium: 4.2 mmol/L (ref 3.5–5.1)
SODIUM: 139 mmol/L (ref 135–145)

## 2015-05-18 MED ORDER — NICOTINE 21 MG/24HR TD PT24
21.0000 mg | MEDICATED_PATCH | Freq: Every day | TRANSDERMAL | Status: DC
  Administered 2015-05-18 – 2015-05-21 (×4): 21 mg via TRANSDERMAL
  Filled 2015-05-18 (×5): qty 1

## 2015-05-18 MED ORDER — OXYCODONE HCL 5 MG PO TABS
15.0000 mg | ORAL_TABLET | ORAL | Status: DC | PRN
  Administered 2015-05-18 – 2015-05-22 (×17): 15 mg via ORAL
  Filled 2015-05-18 (×18): qty 3

## 2015-05-18 MED ORDER — TECHNETIUM TC 99M MEBROFENIN IV KIT
5.5000 | PACK | Freq: Once | INTRAVENOUS | Status: AC | PRN
  Administered 2015-05-18: 5.5 via INTRAVENOUS

## 2015-05-18 NOTE — Progress Notes (Signed)
Initial Nutrition Assessment  DOCUMENTATION CODES:   Morbid obesity  INTERVENTION:  -RD continue to monitor   NUTRITION DIAGNOSIS:   Inadequate oral intake related to vomiting, nausea as evidenced by per patient/family report.  GOAL:   Patient will meet greater than or equal to 90% of their needs  MONITOR:   PO intake, I & O's, Labs, Skin  REASON FOR ASSESSMENT:   Malnutrition Screening Tool    ASSESSMENT:   Bryan Gallegos is a [age] y.o. male with a past medical history significant for NIDDM, HTN, obesity, and opioid/heroin dependence who presents with nausea vomiting.  Spoke with Bryan Gallegos, wife at bedside. He reports vomiting for approximately 3-4 days PTA, with nothing to eat. Admits to vomiting this morning at 0500, no vomiting since then. Stated he has trouble with "greasy foods."  According to MD note, he has RUQ pain, found to have cholelithiasis. Denies weight loss Denies poor appetite. He complains that he is not a diabetic, but is on carb modified diet, unable to get whatever food he wants.  Labs: CBGs 93-126, Ca 8.5, Elevated LFTs Medications reviewed.   Diet Order:  Diet Carb Modified Fluid consistency:: Thin; Room service appropriate?: Yes  Skin:  Reviewed, no issues  Last BM:  3/18  Height:   Ht Readings from Last 1 Encounters:  05/17/15 6\' 2"  (1.88 m)    Weight:   Wt Readings from Last 1 Encounters:  05/17/15 382 lb 11.2 oz (173.592 kg)    Ideal Body Weight:  86.36 kg  BMI:  Body mass index is 49.11 kg/(m^2).  Estimated Nutritional Needs:   Kcal:  1800-2100 calories  Protein:  100-120 grams  Fluid:  >/= 1.8L  EDUCATION NEEDS:   No education needs identified at this time  Dionne AnoWilliam M. Ehan Freas, MS, RD LDN After Hours/Weekend Pager [phone]

## 2015-05-18 NOTE — Progress Notes (Signed)
[facility] PROGRESS NOTE  Bryan Gallegos QMV:[id number] DOB: 04-Oct-1975 DOA: 05/16/2015 PCP: No PCP Per Patient  Summary 05/17/15: Bryan Gallegos seen and examined [name] at bedside in the presence of his wife and reviewed his chart. He was admitted earlier today by Dr. Marylene Land. Please refer to Dr. Cresenciano Genre comprehensive H&P for detailed care plan. Bryan Gallegos is a pleasant 40 year old male with morbid obesity/type 2 diabetes mellitus/essential hypertension/opioid dependency/benzodiazepine dependency, who came in with right upper quadrant abdominal pain especially with food and he is found to have cholelithiasis(shown on ultrasound) with transaminitis therefore question of gallbladder colic. Will obtain HIDA scan and consult general surgery. He has lost IV access hence we will request picc line meanwhile. 05/18/15: Liver enzymes better. Had HIDA scan this afternoon awaiting results. Still has right upper quadrant pain. We'll continue to monitor daily for pain meds as needed and consult general surgery in a.m. Plan Intractable vomiting/Abdominal pain/Cholelithiasis/Gastroesophageal reflux disease with esophagitis  IV fluids/antiemetics/analgesics/PPI  Follow HIDA scan  Consult general surgery name Chronic back pain greater than 3 months duration/Narcotic dependency  Pain meds as needed OSA (obstructive sleep apnea)/Asthma, mild persistent/Diabetes mellitus type II, controlled (HCC)/Morbid obesity (HCC)/Essential hypertension  No acute changes     Code Status: Full code Family Communication: Wife at bedside Disposition Plan: Eventually home, probably mid to this week   Consultants:  General surgery in a.m.  Procedures:    Antibiotics:  None  HPI/Subjective: Complains of right upper quadrant pain.  Objective: Filed Vitals:   05/18/15 0646 05/18/15 1336  BP: 141/85 178/79  Pulse: 83 76  Temp: 98.8 F (37.1 C) 98 F (36.7 C)  Resp: 18 18    Intake/Output  Summary (Last 24 hours) at 05/18/15 1812 Last data filed at 05/18/15 1400  Gross per 24 hour  Intake   3710 ml  Output   1200 ml  Net   2510 ml   Filed Weights   05/17/15 0149  Weight: 173.592 kg (382 lb 11.2 oz)    Exam:   General:  Comfortable at rest.  Cardiovascular: S1-S2 normal. No murmurs. Pulse regular.  Respiratory: Good air entry bilaterally. No rhonchi or rales.  Abdomen: Soft but tender right upper quadrant. Normal bowel sounds. No organomegaly.  Musculoskeletal: No pedal edema   Neurological: Intact  Data Reviewed: Basic Metabolic Panel:  Recent Labs Lab 05/16/15 1928 05/17/15 0523 05/18/15 0400  NA 138 138 139  K 4.7 4.3 4.2  CL 101 100* 104  CO2 GLUCOSE 110* 104* 134*  BUN CREATININE 0.97 0.89 0.86  CALCIUM 9.2 8.8* 8.5*   Liver Function Tests:  Recent Labs Lab 05/16/15 1928 05/17/15 0523 05/18/15 0400  AST 98* 88* 57*  ALT 138* 121* 92*  ALKPHOS 72 64 60  BILITOT 0.9 1.0 0.8  PROT 8.0 7.2 7.0  ALBUMIN 3.9 3.4* 3.2*    Recent Labs Lab 05/16/15 1932  LIPASE 25   No results for input(s): AMMONIA in the last 168 hours. CBC:  Recent Labs Lab 05/16/15 1928 05/17/15 0523 05/18/15 0400  WBC 9.7 8.0 6.9  NEUTROABS  --   --  3.8  HGB 15.6 14.3 13.7  HCT 47.9 42.2 42.2  MCV 96.6 92.5 96.1  PLT 224 191 157   Cardiac Enzymes: No results for input(s): CKTOTAL, CKMB, CKMBINDEX, TROPONINI in the last 168 hours. BNP (last 3 results)  Recent Labs  04/01/15 0030  BNP 16.0    ProBNP (last 3 results) No  results for input(s): PROBNP in the last 8760 hours.  CBG:  Recent Labs Lab 05/17/15 1232 05/17/15 1604 05/17/15 2151 05/18/15 0822 05/18/15 1328  GLUCAP 156* 93 112* 123* 126*    No results found for this or any previous visit (from the past 240 hour(s)).   Studies: Nm Hepatobiliary Liver Func  05/18/2015  CLINICAL DATA:  Epigastric and right upper quadrant pain for three years, getting worse  over the past three months; known gall stones EXAM: NUCLEAR MEDICINE HEPATOBILIARY IMAGING TECHNIQUE: Sequential images of the abdomen were obtained out to 60 minutes following intravenous administration of radiopharmaceutical. RADIOPHARMACEUTICALS:  5.5 mCi [id number]  Choletec IV COMPARISON:  05/17/15 FINDINGS: There is prompt hepatic cellular uptake. Small bowel uptake is seen at 5 minutes. Gallbladder uptake is also seen at 5 minutes. IMPRESSION: This study confirms that the common bile duct and cystic duct are both patent. Electronically Signed   By: Esperanza Heiraymond  Rubner M.D.   On: 05/18/2015 17:53   [place] Abdomen Complete  05/17/2015  CLINICAL DATA:  Abdominal pain for 3 days EXAM: ABDOMEN ULTRASOUND COMPLETE COMPARISON:  None. FINDINGS: Gallbladder: There is a mobile 1.4 cm calculus within the gallbladder lumen. There is no gallbladder mural thickening or pericholecystic fluid. The patient was not tender to probe pressure over the gallbladder. Common bile duct: Diameter: 5.5 mm, normal. Liver: Increased parenchymal echogenicity consistent with fatty infiltration. No focal liver lesion. IVC: No abnormality visualized. Pancreas: Limited.  Visualized portion unremarkable. Spleen: Size and appearance within normal limits. Right Kidney: Length: 15.4 cm. Echogenicity within normal limits. No mass or hydronephrosis visualized. Left Kidney: Length: 15.0 cm. Echogenicity within normal limits. No mass or hydronephrosis visualized. Abdominal aorta: No aneurysm visualized. Other findings: None. IMPRESSION: Cholelithiasis without sonographic evidence of cholecystitis. Increased hepatic echogenicity, likely fatty infiltration. Electronically Signed   By: Ellery Plunkaniel R Raegan M.D.   On: 05/17/2015 06:01   Ct Abdomen Pelvis W Contrast  05/16/2015  CLINICAL DATA:  Right upper quadrant abdominal pain, hematemesis, nausea and vomiting. Onset today. EXAM: CT ABDOMEN AND PELVIS WITH CONTRAST TECHNIQUE: Multidetector CT imaging of the  abdomen and pelvis was performed using the standard protocol following bolus administration of intravenous contrast. CONTRAST:  25mL OMNIPAQUE IOHEXOL 300 MG/ML SOLN, 100mL OMNIPAQUE IOHEXOL 300 MG/ML SOLN COMPARISON:  03/30/2015 FINDINGS: Lower chest: Multiple pulmonary nodules, the largest measuring 4.7 mm in the posterior right lower lobe based. This has been present over multiple prior studies dating back to at least 09/18/2008. Hepatobiliary: There are normal appearances of the liver, gallbladder and bile ducts. Pancreas: Normal Spleen: Normal Adrenals/Urinary Tract: The adrenals and kidneys are normal in appearance. There is no urinary calculus evident. There is no hydronephrosis or ureteral dilatation. Collecting systems and ureters appear unremarkable. Stomach/Bowel: There is a small hiatal hernia. The appendix is normal. Small bowel and colon are unremarkable. Vascular/Lymphatic: The abdominal aorta is normal in caliber. There is no atherosclerotic calcification. There is no adenopathy in the abdomen or pelvis. Other: No acute inflammatory changes are evident in the abdomen or pelvis. There is no ascites. Musculoskeletal: Small fat containing umbilical hernia. No significant skeletal lesions. IMPRESSION: No acute findings are evident in the abdomen or pelvis. Stable pulmonary nodules in the lung bases. Small hiatal hernia. Small fat containing umbilical hernia. Electronically Signed   By: Ellery Plunkaniel R Woodson M.D.   On: 05/16/2015 22:36    Scheduled Meds: . dicyclomine  10 mg Oral TID AC & HS  . gabapentin  800 mg Oral TID  . insulin  aspart  0-15 Units Subcutaneous TID WC  . insulin aspart  0-5 Units Subcutaneous QHS  . lisinopril  40 mg Oral BID  . pantoprazole (PROTONIX) IV  40 mg Intravenous Q12H  . sodium chloride flush  10-40 mL Intracatheter Q12H   Continuous Infusions: . sodium chloride 75 mL/hr at 05/18/15 0400     Time spent: 15 minutes    Nyasiah Moffet  [facility] Pager  [phone]. If 7PM-7AM, please contact night-coverage at [email or web address], password [facility] 05/18/2015, 6:12 PM  LOS: 1 day

## 2015-05-19 LAB — COMPREHENSIVE METABOLIC PANEL
ALK PHOS: 59 U/L (ref 38–126)
ALT: 90 U/L — ABNORMAL HIGH (ref 17–63)
ANION GAP: 9 (ref 5–15)
AST: 58 U/L — ABNORMAL HIGH (ref 15–41)
Albumin: 3.5 g/dL (ref 3.5–5.0)
BUN: 13 mg/dL (ref 6–20)
CALCIUM: 9 mg/dL (ref 8.9–10.3)
CO2: 27 mmol/L (ref 22–32)
Chloride: 104 mmol/L (ref 101–111)
Creatinine, Ser: 0.98 mg/dL (ref 0.61–1.24)
GFR calc non Af Amer: >60 mL/min (ref >60)
Glucose, Bld: 150 mg/dL — ABNORMAL HIGH (ref 65–99)
POTASSIUM: 4.3 mmol/L (ref 3.5–5.1)
SODIUM: 140 mmol/L (ref 135–145)
Total Bilirubin: 0.7 mg/dL (ref 0.3–1.2)
Total Protein: 7.5 g/dL (ref 6.5–8.1)

## 2015-05-19 LAB — CBC WITH DIFFERENTIAL/PLATELET
BASOS PCT: 0 %
Basophils Absolute: 0 10*3/uL (ref 0.0–0.1)
EOS ABS: 0.2 10*3/uL (ref 0.0–0.7)
EOS PCT: 3 %
HCT: 43.8 % (ref 39.0–52.0)
HEMOGLOBIN: 14.3 g/dL (ref 13.0–17.0)
LYMPHS ABS: 2.1 10*3/uL (ref 0.7–4.0)
Lymphocytes Relative: 26 %
MCH: 31.2 pg (ref 26.0–34.0)
MCHC: 32.6 g/dL (ref 30.0–36.0)
MCV: 95.4 fL (ref 78.0–100.0)
MONO ABS: 0.7 10*3/uL (ref 0.1–1.0)
MONOS PCT: 9 %
Neutro Abs: 5.2 10*3/uL (ref 1.7–7.7)
Neutrophils Relative %: 62 %
PLATELETS: 195 10*3/uL (ref 150–400)
RBC: 4.59 MIL/uL (ref 4.22–5.81)
RDW: 13.2 % (ref 11.5–15.5)
WBC: 8.2 10*3/uL (ref 4.0–10.5)

## 2015-05-19 LAB — URINALYSIS, ROUTINE W REFLEX MICROSCOPIC
Bilirubin Urine: NEGATIVE
GLUCOSE, UA: NEGATIVE mg/dL
HGB URINE DIPSTICK: NEGATIVE
Ketones, ur: NEGATIVE mg/dL
Leukocytes, UA: NEGATIVE
Nitrite: NEGATIVE
PH: 7 (ref 5.0–8.0)
PROTEIN: NEGATIVE mg/dL
SPECIFIC GRAVITY, URINE: 1.006 (ref 1.005–1.030)

## 2015-05-19 LAB — HEMOGLOBIN A1C
Hgb A1c MFr Bld: 5.9 % — ABNORMAL HIGH (ref 4.8–5.6)
MEAN PLASMA GLUCOSE: 123 mg/dL

## 2015-05-19 LAB — GLUCOSE, CAPILLARY
GLUCOSE-CAPILLARY: 196 mg/dL — AB (ref 65–99)
Glucose-Capillary: 128 mg/dL — ABNORMAL HIGH (ref 65–99)
Glucose-Capillary: 155 mg/dL — ABNORMAL HIGH (ref 65–99)
Glucose-Capillary: 140 mg/dL — ABNORMAL HIGH (ref 65–99)

## 2015-05-19 NOTE — Progress Notes (Signed)
05/19/15 1506: Pt requesting Oxycodone, stating that the IV Morphine "has not helped at all". Medicated. Lina SarBeth Baxter Gonzalez, RN

## 2015-05-19 NOTE — Consult Note (Signed)
Re:   Bryan Gallegos DOB:   09-08-1975 MRN:   [id number]   [facility] Consultation  ASSESSMENT AND PLAN: 1.  Gall stones  In light of normal HIDA scan, his gall bladder is probably not the cause of his current symptoms.  He's had some significant drug and personality issues over the last month or two.  At this time, I think that he would be best evaluated by GI.  He does not have an indication for urgent surgery.  Dr. Gerrit Friends is our surgeon of the week and I will discuss the patient with him in the AM.  2.  Abdominal pain  3.  GERD  Small HH on CT 4.  Chronic back pain  On chronic narcotics - was taking oxycodone, but has been out of meds for about 2 months.  5.  OSA 6.  DM  Glucose - 150 - 05/19/2015 7.  HTN 8.  Morbid obesity  Weight - 382, BMI - 49.2 9.  Umbilical hernia 10.  Bipolar disorder   Was followed at [facility], but is not seen regularly now.  Uses Xanax to control his anxiety  He was seen by Dr. Diannia Ruder, psychiatrist in 04/12/2015 - in her record is history of heroin abuse and his recent behavioral health admission.  Recently hospitalized at [facility] - Feb 2017 - with convoluted story.  He has also recently been in and out of jail earlier this year. 11.  Smokes about 1/2 ppd   Chief Complaint  Patient presents with  . Hematemesis   REFERRING PHYSICIAN: No PCP Per Patient  HISTORY OF PRESENT ILLNESS: Bryan Gallegos is a 40 y.o. (DOB: [birth year])  white  male whose primary care physician is No PCP Per Patient (He says he goes to [place], [place],  and [employer] [place] [facility]).  He had similar symptoms around 3 years ago, but he said they were more episodic.  He saw Dr. Celene Skeen, GI, in [facility].  He had an upper endoscopy and colonoscopy around that time.  But he got better on meds that Dr. Kathrynn Speed placed him on.  About 1 to 1 1/2 years ago, he started having trouble again with nausea and vomiting certain  foods.  This has gotten acutely worse over the last 4 to 6 weeks.  He claims to have lost about 50 pounds over the last 4 to 6 weeks, fromo 420 to about 370.  (Note: his weight in Epic is 382).  He complains of having undigested food when he vomits, foul smelling burping, but no fever.  Mr. Ribas was admitted by Dr. Maryfrances Bunnell on 05/17/2015 for vomiting and malaise.  Initially some of his symptoms were thought to be possible opiate withdrawal.  CT scan of abdomen - 05/16/2015 - No acute findings are evident in the abdomen or pelvis. Stable pulmonary nodules in the lung bases. Small hiatal hernia. Small fat containing umbilical hernia. [place] of abdomen - 05/17/2015 - Cholelithiasis without sonographic evidence of cholecystitis.  Increased hepatic echogenicity, likely fatty infiltration. HIDA scan - 05/28/2015 - This study confirms that the common bile duct and cystic duct are both patent.  05/19/2015 - LFT - AST - 58, ALT - 90, T. Bili - 0.7   Past Medical History  Diagnosis Date  . Heart valve disorder     s/p echocardiogram  . Hypertension   . Asthma   . Diabetes mellitus   . Hyperlipidemia   .  Anaphylactic reaction   . Bipolar disorder (HCC)   . Adult ADHD   . Morbidly obese (HCC)   . Transient cerebral ischemia     Unknown  . OSA (obstructive sleep apnea)   . Anxiety   . Complication of anesthesia     Per patient difficult intubation;  . Difficult intubation     Per patient      Past Surgical History  Procedure Laterality Date  . Carpal tunnel release    . Nose surgery    . Wisdom tooth extraction    . Tooth extraction    . Esophagogastroduodenoscopy N/A 04/05/2015    Procedure: ESOPHAGOGASTRODUODENOSCOPY (EGD);  Surgeon: Malissa HippoNajeeb U Rehman, MD;  Location: [facility];  Service: Endoscopy;  Laterality: N/A;      Current Facility-Administered Medications  Medication Dose Route Frequency Provider Last Rate Last Dose  . 0.9 %  sodium chloride infusion   Intravenous Continuous  Simbiso Ranga, MD 75 mL/hr at 05/19/15 0606    . albuterol (PROVENTIL) (2.5 MG/3ML) 0.083% nebulizer solution 2.5 mg  2.5 mg Nebulization Q6H PRN Alberteen Samhristopher P Danford, MD      . ALPRAZolam Prudy Feeler(XANAX) tablet 1 mg  1 mg Oral TID PRN Simbiso Ranga, MD   1 mg at 05/19/15 1002  . dicyclomine (BENTYL) capsule 10 mg  10 mg Oral TID AC & HS Alberteen Samhristopher P Danford, MD   10 mg at 05/19/15 1639  . gabapentin (NEURONTIN) capsule 800 mg  800 mg Oral TID Alberteen Samhristopher P Danford, MD   800 mg at 05/19/15 1511  . hydrOXYzine (ATARAX/VISTARIL) tablet 50 mg  50 mg Oral TID PRN Alberteen Samhristopher P Danford, MD   50 mg at 05/17/15 1310  . insulin aspart (novoLOG) injection 0-15 Units  0-15 Units Subcutaneous TID WC Alberteen Samhristopher P Danford, MD   3 Units at 05/19/15 1643  . insulin aspart (novoLOG) injection 0-5 Units  0-5 Units Subcutaneous QHS Alberteen Samhristopher P Danford, MD   0 Units at 05/17/15 2200  . lisinopril (PRINIVIL,ZESTRIL) tablet 40 mg  40 mg Oral BID Alberteen Samhristopher P Danford, MD   40 mg at 05/19/15 1002  . morphine 2 MG/ML injection 2 mg  2 mg Intravenous Q2H PRN Simbiso Ranga, MD   2 mg at 05/19/15 1642  . nicotine (NICODERM CQ - dosed in mg/24 hours) patch 21 mg  21 mg Transdermal Daily Rolan Lipahomas Michael Callahan, NP   21 mg at 05/19/15 1002  . ondansetron (ZOFRAN) tablet 8 mg  8 mg Oral Q8H PRN Alberteen Samhristopher P Danford, MD       Or  . ondansetron (ZOFRAN) injection 4 mg  4 mg Intravenous Q6H PRN Alberteen Samhristopher P Danford, MD   4 mg at 05/19/15 0817  . oxyCODONE (Oxy IR/ROXICODONE) immediate release tablet 15 mg  15 mg Oral Q4H PRN Simbiso Ranga, MD   15 mg at 05/19/15 1946  . pantoprazole (PROTONIX) injection 40 mg  40 mg Intravenous Q12H Simbiso Ranga, MD   40 mg at 05/19/15 1001  . prochlorperazine (COMPAZINE) injection 10 mg  10 mg Intravenous Q6H PRN Alberteen Samhristopher P Danford, MD      . sodium chloride flush (NS) 0.9 % injection 10-40 mL  10-40 mL Intracatheter Q12H Simbiso Ranga, MD   10 mL at 05/19/15 1005  . sodium chloride flush  (NS) 0.9 % injection 10-40 mL  10-40 mL Intracatheter PRN Conley CanalSimbiso Ranga, MD          Allergies  Allergen Reactions  . Atenolol Anaphylaxis  . Other  Anaphylaxis    "Prednisone like drugs"  . Tylenol [Acetaminophen] Other (See Comments)    GI Bleed    REVIEW OF SYSTEMS: Skin:  No history of rash.  No history of abnormal moles. Infection:  No history of hepatitis or HIV.  No history of MRSA. Neurologic:  History of right sided stroke around one year ago, 2016. (see 03/30/2015 note by Dr. Deretha Emory) Cardiac:  HTN Pulmonary:  OSA, but not using machine.  Smokes 1/2 - 1 ppd.  Endocrine:  DM - on Januvia. Gastrointestinal:  See HPI  Claimed to have swallowed 6 AA batteries while in jail on ED eval 04/05/2015 Urologic:  No history of kidney stones.  No history of bladder infections. 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  Afraid of surgery, because his brother has had multiple operations for back disease and is no better.   Having trouble with is left knee Hematologic:  No bleeding disorder.  No history of anemia.  Not anticoagulated. Psycho-social:  Bipolar, but uses Xanax to control anxiety.  He was seen by Dr. Diannia Ruder, psychiatrist in 04/12/2015 - in her record is history of heroin abuse.  Her note refers to recent behavoiral health hospitalization at [facility].  See her 04/12/2015 note for complete record.  SOCIAL and FAMILY HISTORY: On disability x 10 years. Rosey Bath is his mate.  He has never married her, but has 3 sons with her. Three sons:  24, 23, 20  Note:  I took care of his son, Ines Bloomer, March 2016 for gall bladder disease.  Shawn originally saw Dr. Derrell Lolling, but I got involved because his symptoms were worse and he could not wait for Dr. Derrell Lolling.  Of note, in my notes, he said that both his parent had drug problems.  PHYSICAL EXAM: BP 145/71 mmHg  Pulse 72  Temp(Src) 97.7 F (36.5 C) (Oral)  Resp 18  Ht  (1.88 m)  Wt 173.592 kg (382 lb  11.2 oz)  BMI 49.11 kg/m2  SpO2 99%  General: Obese WM who is alert  HEENT: Normal. Pupils equal. Neck: Supple. No mass.  No thyroid mass.  Very stout neck. Lymph Nodes:  No supraclavicular or cervical nodes. Lungs: Clear to auscultation and symmetric breath sounds. Heart:  RRR. No murmur or rub.  Abdomen: Soft. No mass.  No hernia. Normal bowel sounds.  No abdominal scars.  Obese, more apple than pear.  Has some soreness along his right abdomen. No peritoneal signs. Rectal: Not done. Extremities:  Good strength and ROM  in upper and lower extremities.  Has chronic venous stasis changes of both LE. Neurologic:  Grossly intact to motor and sensory function. Psychiatric: Has normal mood and affect. Behavior is normal.   DATA REVIEWED: See Epic  Ovidio Kin, MD,  [facility] [address]  [address]   [place], [place]    [address] Phone:  [phone] FAX:  [phone]

## 2015-05-19 NOTE — Progress Notes (Signed)
[facility] PROGRESS NOTE  Bryan Gallegos ZOX:[id number] DOB: 02/16/1976 DOA: 05/16/2015 PCP: No PCP Per Patient  Summary 05/17/15: Peggye FormI've seen and examined Bryan Gallegos at bedside in the presence of his wife and reviewed his chart. He was admitted earlier today by Dr. Marylene Landunford. Please refer to Dr. Cresenciano Genreunford's comprehensive H&P for detailed care plan. Bryan Gallegos Gallegos is a pleasant 40 year old male with morbid obesity/type 2 diabetes mellitus/essential hypertension/opioid dependency/benzodiazepine dependency, who came in with right upper quadrant abdominal pain especially with food and he is found to have cholelithiasis(shown on ultrasound) with transaminitis therefore question of gallbladder colic. Will obtain HIDA scan and consult general surgery. He has lost IV access hence we will request picc line meanwhile. 05/18/15: Liver enzymes better. Had HIDA scan this afternoon awaiting results. Still has right upper quadrant pain. We'll continue to monitor daily for pain meds as needed and consult general surgery in a.m. 05/19/15: HIDA scan unremarkable. Still has right upper quadrant pain and vomiting each time he eats. Liver enzymes somewhat improved. Will consult general surgery-? How to proceed in view of gallstones. Spoke with Dr. Ezzard StandingNewman who will graciously see patient in consult. Appreciate help. Patient apparently disconnected IV bag from PICC line, it is not clear how this happened. Will need to watch given history of narcotic abuse. Plan Intractable vomiting/Abdominal pain/Cholelithiasis/Gastroesophageal reflux disease with esophagitis  IV fluids/antiemetics/analgesics/PPI  Consult general surgery name Chronic back pain greater than 3 months duration/Narcotic dependency  Pain meds as needed OSA (obstructive sleep apnea)/Asthma, mild persistent/Diabetes mellitus type II, controlled (HCC)/Morbid obesity (HCC)/Essential hypertension  No acute changes    Code Status: Full code Family  Communication: Wife at bedside Disposition Plan: Eventually home, probably mid this week   Consultants:  General surgery   Procedures:    Antibiotics:  None  HPI/Subjective: Complains of abdominal pain and vomiting  Objective: Filed Vitals:   05/18/15 2100 05/19/15 1400  BP: 135/57 145/71  Pulse: 79 72  Temp: 99 F (37.2 C) 97.7 F (36.5 C)  Resp: 18 18    Intake/Output Summary (Last 24 hours) at 05/19/15 2000 Last data filed at 05/19/15 1800  Gross per 24 hour  Intake   1810 ml  Output      0 ml  Net   1810 ml   Filed Weights   05/17/15 0149  Weight: 173.592 kg (382 lb 11.2 oz)    Exam:   General:  Comfortable at rest.  Cardiovascular: S1-S2 normal. No murmurs. Pulse regular.  Respiratory: Good air entry bilaterally. No rhonchi or rales.  Abdomen: Soft and tender right upper quadrant. Normal bowel sounds. No organomegaly.  Musculoskeletal: No pedal edema   Neurological: Intact  Data Reviewed: Basic Metabolic Panel:  Recent Labs Lab 05/16/15 1928 05/17/15 0523 05/18/15 0400 05/19/15 0650  NA 138 138 139 140  K 4.7 4.3 4.2 4.3  CL 101 100* 104 104  CO2 29 30 27 27   GLUCOSE 110* 104* 134* 150*  BUN 13 12 12 13   CREATININE 0.97 0.89 0.86 0.98  CALCIUM 9.2 8.8* 8.5* 9.0   Liver Function Tests:  Recent Labs Lab 05/16/15 1928 05/17/15 0523 05/18/15 0400 05/19/15 0650  AST 98* 88* 57* 58*  ALT 138* 121* 92* 90*  ALKPHOS 72 64 60 59  BILITOT 0.9 1.0 0.8 0.7  PROT 8.0 7.2 7.0 7.5  ALBUMIN 3.9 3.4* 3.2* 3.5    Recent Labs Lab 05/16/15 1932  LIPASE 25   No results for input(s): AMMONIA in the last 168 hours. CBC:  Recent Labs Lab 05/16/15 1928 05/17/15 0523 05/18/15 0400 05/19/15 0650  WBC 9.7 8.0 6.9 8.2  NEUTROABS  --   --  3.8 5.2  HGB 15.6 14.3 13.7 14.3  HCT 47.9 42.2 42.2 43.8  MCV 96.6 92.5 96.1 95.4  PLT 224 191 157 195   Cardiac Enzymes: No results for input(s): CKTOTAL, CKMB, CKMBINDEX, TROPONINI in the  last 168 hours. BNP (last 3 results)  Recent Labs  04/01/15 0030  BNP 16.0    ProBNP (last 3 results) No results for input(s): PROBNP in the last 8760 hours.  CBG:  Recent Labs Lab 05/18/15 1328 05/18/15 2206 05/19/15 0755 05/19/15 1158 05/19/15 1642  GLUCAP 126* 155* 128* 196* 155*    No results found for this or any previous visit (from the past 240 hour(s)).   Studies: Nm Hepatobiliary Liver Func  05/18/2015  CLINICAL DATA:  Epigastric and right upper quadrant pain for three years, getting worse over the past three months; known gall stones EXAM: NUCLEAR MEDICINE HEPATOBILIARY IMAGING TECHNIQUE: Sequential images of the abdomen were obtained out to 60 minutes following intravenous administration of radiopharmaceutical. RADIOPHARMACEUTICALS:  5.5 mCi Tc-47m  Choletec IV COMPARISON:  05/17/15 FINDINGS: There is prompt hepatic cellular uptake. Small bowel uptake is seen at 5 minutes. Gallbladder uptake is also seen at 5 minutes. IMPRESSION: This study confirms that the common bile duct and cystic duct are both patent. Electronically Signed   By: Esperanza Heir M.D.   On: 05/18/2015 17:53    Scheduled Meds: . dicyclomine  10 mg Oral TID AC & HS  . gabapentin  800 mg Oral TID  . insulin aspart  0-15 Units Subcutaneous TID WC  . insulin aspart  0-5 Units Subcutaneous QHS  . lisinopril  40 mg Oral BID  . nicotine  21 mg Transdermal Daily  . pantoprazole (PROTONIX) IV  40 mg Intravenous Q12H  . sodium chloride flush  10-40 mL Intracatheter Q12H   Continuous Infusions: . sodium chloride 75 mL/hr at 05/19/15 0606     Time spent: 15 minutes    Bryan Gallegos  [facility] Pager [phone]. If 7PM-7AM, please contact night-coverage at [email or web address], password [facility] 05/19/2015, 8:00 PM  LOS: 2 days

## 2015-05-20 LAB — COMPREHENSIVE METABOLIC PANEL
ALBUMIN: 3.3 g/dL — AB (ref 3.5–5.0)
ALT: 84 U/L — AB (ref 17–63)
ANION GAP: 8 (ref 5–15)
AST: 54 U/L — AB (ref 15–41)
Alkaline Phosphatase: 62 U/L (ref 38–126)
BUN: 14 mg/dL (ref 6–20)
CALCIUM: 9.1 mg/dL (ref 8.9–10.3)
CHLORIDE: 104 mmol/L (ref 101–111)
CO2: 28 mmol/L (ref 22–32)
CREATININE: 1.01 mg/dL (ref 0.61–1.24)
Glucose, Bld: 140 mg/dL — ABNORMAL HIGH (ref 65–99)
Potassium: 4.2 mmol/L (ref 3.5–5.1)
SODIUM: 140 mmol/L (ref 135–145)
Total Bilirubin: 0.4 mg/dL (ref 0.3–1.2)
Total Protein: 7.1 g/dL (ref 6.5–8.1)

## 2015-05-20 LAB — GLUCOSE, CAPILLARY
GLUCOSE-CAPILLARY: 123 mg/dL — AB (ref 65–99)
GLUCOSE-CAPILLARY: 161 mg/dL — AB (ref 65–99)
Glucose-Capillary: 132 mg/dL — ABNORMAL HIGH (ref 65–99)
Glucose-Capillary: 90 mg/dL (ref 65–99)

## 2015-05-20 LAB — CBC
HEMATOCRIT: 41.8 % (ref 39.0–52.0)
Hemoglobin: 14.3 g/dL (ref 13.0–17.0)
MCH: 31.2 pg (ref 26.0–34.0)
MCHC: 34.2 g/dL (ref 30.0–36.0)
MCV: 91.3 fL (ref 78.0–100.0)
Platelets: 186 10*3/uL (ref 150–400)
RBC: 4.58 MIL/uL (ref 4.22–5.81)
RDW: 13.2 % (ref 11.5–15.5)
WBC: 8.9 10*3/uL (ref 4.0–10.5)

## 2015-05-20 LAB — URINE CULTURE: CULTURE: NO GROWTH

## 2015-05-20 NOTE — Progress Notes (Signed)
Subjective: Still complaining of abdominal pain mid right abdomen.  He told me he was not eating, but nursing reports he is eating, sausage this AM.  He reports vomiting which nursing staff have no knowledge of.    Objective: Vital signs in last 24 hours: Temp:  [97.7 F (36.5 C)-98.8 F (37.1 C)] 98.8 F (37.1 C) (03/21 0540) Pulse Rate:  [72-82] 82 (03/21 0540) Resp:  [18] 18 (03/21 0540) BP: (115-145)/(71-80) 115/80 mmHg (03/21 0540) SpO2:  [97 %-99 %] 97 % (03/21 0540) Last BM Date: 05/18/15 PO 1680 recorded  Carb modified diet Urine 300 recorded Afebrile, VSS Labs are normal aside from ALT 84/AST 54 HIDA - 05/18/15:  There is prompt hepatic cellular uptake. Small bowel uptake is seen at 5 minutes. Gallbladder uptake is also seen at 5 minutes. CT scan 05/16/15:  No acute findings are evident in the abdomen or pelvis. Stable pulmonary nodules in the lung bases. Small hiatal hernia. Small fat containing umbilical hernia.   Intake/Output from previous day: 03/20 0701 - 03/21 0700 In: 2880 [P.O.:1680; I.V.:1200] Out: 300 [Urine:300] Intake/Output this shift:    General appearance: alert, cooperative and no distress GI: moves easily, soft, exam is unremarkable, he point to pain mid right abodmen.   Lab Results:   Recent Labs  05/19/15 0650 05/20/15 0545  WBC 8.2 8.9  HGB 14.3 14.3  HCT 43.8 41.8  PLT 195 186    BMET  Recent Labs  05/19/15 0650 05/20/15 0545  NA 140 140  K 4.3 4.2  CL 104 104  CO2 27 28  GLUCOSE 150* 140*  BUN 13 14  CREATININE 0.98 1.01  CALCIUM 9.0 9.1   PT/INR No results for input(s): LABPROT, INR in the last 72 hours.   Recent Labs Lab 05/16/15 1928 05/17/15 0523 05/18/15 0400 05/19/15 0650 05/20/15 0545  AST 98* 88* 57* 58* 54*  ALT 138* 121* 92* 90* 84*  ALKPHOS 72 64 60 59 62  BILITOT 0.9 1.0 0.8 0.7 0.4  PROT 8.0 7.2 7.0 7.5 7.1  ALBUMIN 3.9 3.4* 3.2* 3.5 3.3*     Lipase     Component Value Date/Time   LIPASE 25  05/16/2015 1932     Studies/Results: Nm Hepatobiliary Liver Func  05/18/2015  CLINICAL DATA:  Epigastric and right upper quadrant pain for three years, getting worse over the past three months; known gall stones EXAM: NUCLEAR MEDICINE HEPATOBILIARY IMAGING TECHNIQUE: Sequential images of the abdomen were obtained out to 60 minutes following intravenous administration of radiopharmaceutical. RADIOPHARMACEUTICALS:  5.5 mCi Tc-12m  Choletec IV COMPARISON:  05/17/15 FINDINGS: There is prompt hepatic cellular uptake. Small bowel uptake is seen at 5 minutes. Gallbladder uptake is also seen at 5 minutes. IMPRESSION: This study confirms that the common bile duct and cystic duct are both patent. Electronically Signed   By: Esperanza Heir M.D.   On: 05/18/2015 17:53    Medications: . dicyclomine  10 mg Oral TID AC & HS  . gabapentin  800 mg Oral TID  . insulin aspart  0-15 Units Subcutaneous TID WC  . insulin aspart  0-5 Units Subcutaneous QHS  . lisinopril  40 mg Oral BID  . nicotine  21 mg Transdermal Daily  . pantoprazole (PROTONIX) IV  40 mg Intravenous Q12H  . sodium chloride flush  10-40 mL Intracatheter Q12H    Assessment/Plan Abdominal pain Gallstone/ mild AST/ALT elevations/Normal HIDA Chronic back pain out of narcotics Hx of heroin use AODM Bipolar disorder/ADHD Tobacco use Body  mass index is 49.1 OSA Umbilical hernia  Hx of TIA Antibiotics:  None DVT:  SCD's only     Plan:  Currently no indication to recommend surgery.  Dr. Ezzard StandingNewman recommends GI evaluation.  Dr. Gerrit FriendsGerkin will see later today.      LOS: 3 days    Montez Cuda,Vitali 05/20/2015

## 2015-05-21 ENCOUNTER — Inpatient Hospital Stay (HOSPITAL_COMMUNITY): Payer: Medicare Other | Admitting: Anesthesiology

## 2015-05-21 ENCOUNTER — Encounter (HOSPITAL_COMMUNITY)
Admission: EM | Disposition: A | Discharge status: Home / Self Care | Payer: Self-pay | Source: Home / Self Care | Attending: Internal Medicine

## 2015-05-21 ENCOUNTER — Encounter (HOSPITAL_COMMUNITY): Payer: Self-pay

## 2015-05-21 DIAGNOSIS — R1011 Right upper quadrant pain: Secondary | ICD-10-CM

## 2015-05-21 DIAGNOSIS — R1013 Epigastric pain: Secondary | ICD-10-CM | POA: Insufficient documentation

## 2015-05-21 DIAGNOSIS — F1120 Opioid dependence, uncomplicated: Secondary | ICD-10-CM

## 2015-05-21 DIAGNOSIS — G4733 Obstructive sleep apnea (adult) (pediatric): Secondary | ICD-10-CM

## 2015-05-21 DIAGNOSIS — Z794 Long term (current) use of insulin: Secondary | ICD-10-CM

## 2015-05-21 DIAGNOSIS — R1110 Vomiting, unspecified: Principal | ICD-10-CM

## 2015-05-21 DIAGNOSIS — K8020 Calculus of gallbladder without cholecystitis without obstruction: Secondary | ICD-10-CM

## 2015-05-21 DIAGNOSIS — E119 Type 2 diabetes mellitus without complications: Secondary | ICD-10-CM

## 2015-05-21 HISTORY — PX: ESOPHAGOGASTRODUODENOSCOPY (EGD) WITH PROPOFOL: SHX5813

## 2015-05-21 LAB — GLUCOSE, CAPILLARY
GLUCOSE-CAPILLARY: 141 mg/dL — AB (ref 65–99)
Glucose-Capillary: 126 mg/dL — ABNORMAL HIGH (ref 65–99)

## 2015-05-21 SURGERY — ESOPHAGOGASTRODUODENOSCOPY (EGD) WITH PROPOFOL
Anesthesia: Monitor Anesthesia Care

## 2015-05-21 MED ORDER — PROPOFOL 10 MG/ML IV BOLUS
INTRAVENOUS | Status: AC
  Filled 2015-05-21: qty 20

## 2015-05-21 MED ORDER — LACTATED RINGERS IV SOLN
INTRAVENOUS | Status: DC | PRN
  Administered 2015-05-21: 12:00:00 via INTRAVENOUS

## 2015-05-21 MED ORDER — PANTOPRAZOLE SODIUM 40 MG PO TBEC
40.0000 mg | DELAYED_RELEASE_TABLET | Freq: Every day | ORAL | Status: DC
  Administered 2015-05-21: 40 mg via ORAL
  Filled 2015-05-21 (×2): qty 1

## 2015-05-21 MED ORDER — BUTAMBEN-TETRACAINE-BENZOCAINE 2-2-14 % EX AERO
INHALATION_SPRAY | CUTANEOUS | Status: DC | PRN
  Administered 2015-05-21: 2 via TOPICAL

## 2015-05-21 MED ORDER — ONDANSETRON HCL 8 MG PO TABS
8.0000 mg | ORAL_TABLET | Freq: Three times a day (TID) | ORAL | Status: DC
  Administered 2015-05-21 – 2015-05-22 (×3): 8 mg via ORAL
  Filled 2015-05-21 (×3): qty 1
  Filled 2015-05-21: qty 2
  Filled 2015-05-21 (×3): qty 1
  Filled 2015-05-21 (×2): qty 2
  Filled 2015-05-21: qty 1

## 2015-05-21 MED ORDER — KETAMINE HCL 10 MG/ML IJ SOLN
INTRAMUSCULAR | Status: DC | PRN
  Administered 2015-05-21 (×3): 10 mg via INTRAVENOUS

## 2015-05-21 MED ORDER — PROPOFOL 500 MG/50ML IV EMUL
INTRAVENOUS | Status: DC | PRN
  Administered 2015-05-21: 25 ug/kg/min via INTRAVENOUS

## 2015-05-21 MED ORDER — KETAMINE HCL 10 MG/ML IJ SOLN
INTRAMUSCULAR | Status: AC
  Filled 2015-05-21: qty 1

## 2015-05-21 MED ORDER — MORPHINE SULFATE (PF) 2 MG/ML IV SOLN
1.0000 mg | INTRAVENOUS | Status: DC | PRN
  Administered 2015-05-21 (×4): 1 mg via INTRAVENOUS
  Filled 2015-05-21 (×4): qty 1

## 2015-05-21 SURGICAL SUPPLY — 14 items

## 2015-05-21 NOTE — Progress Notes (Signed)
[facility] PROGRESS NOTE  Hennie DuosMitchell L Coplen ZOX:[id number] DOB: 09/08/1975 DOA: 05/16/2015 PCP: No PCP Per Patient  Summary Appreciate General surgery/Gi. Su Bryan Gallegos is a pleasant 40 year old male with morbid obesity/type 2 diabetes mellitus/essential hypertension/opioid dependency/benzodiazepine dependency, who came in with right upper quadrant abdominal pain especially with food and he was found to have cholelithiasis(shown on ultrasound) with transaminitis worse than baseline level therefore question of gallbladder colic.  HIDA scan was unremarkable. He was seen by general surgery who recommended gi evaluation which has been requested([facility] will see tomorrow morning). Patient says he still has right upper quadrant pain and vomiting each time he eats. Liver enzymes somewhat improved. Disposition will depend on GI/general surgery recommendations. Plan Intractable vomiting/Abdominal pain/Cholelithiasis/Gastroesophageal reflux disease with esophagitis  IV fluids/antiemetics/analgesics/PPI  Appreciate general surgery  Follow GI recommendations Chronic back pain greater than 3 months duration/Narcotic dependency  Pain meds as needed OSA (obstructive sleep apnea)/Asthma, mild persistent/Diabetes mellitus type II, controlled (HCC)/Morbid obesity (HCC)/Essential hypertension  No acute changes    Code Status: Full code Family Communication: Wife at bedside Disposition Plan: Eventually home, probably this week   Consultants:  General surgery  GI  Procedures:    Antibiotics:  None  HPI/Subjective: Still has abdominal pain  Objective: Filed Vitals:   05/20/15 2140 05/21/15 0559  BP:  118/75  Pulse:  89  Temp: 98.8 F (37.1 C) 98.7 F (37.1 C)  Resp:  18    Intake/Output Summary (Last 24 hours) at 05/21/15 0642 Last data filed at 05/20/15 1800  Gross per 24 hour  Intake    360 ml  Output      0 ml  Net    360 ml   Filed Weights   05/17/15 0149   Weight: 173.592 kg (382 lb 11.2 oz)    Exam:   General:  Comfortable at rest.  Cardiovascular: S1-S2 normal. No murmurs. Pulse regular.  Respiratory: Good air entry bilaterally. No rhonchi or rales.  Abdomen: Soft and nontender. Normal bowel sounds. No organomegaly.  Musculoskeletal: No pedal edema   Neurological: Intact  Data Reviewed: Basic Metabolic Panel:  Recent Labs Lab 05/16/15 1928 05/17/15 0523 05/18/15 0400 05/19/15 0650 05/20/15 0545  NA 138 138 139 140 140  K 4.7 4.3 4.2 4.3 4.2  CL 101 100* 104 104 104  CO2 29 30 27 27 28   GLUCOSE 110* 104* 134* 150* 140*  BUN 13 12 12 13 14   CREATININE 0.97 0.89 0.86 0.98 1.01  CALCIUM 9.2 8.8* 8.5* 9.0 9.1   Liver Function Tests:  Recent Labs Lab 05/16/15 1928 05/17/15 0523 05/18/15 0400 05/19/15 0650 05/20/15 0545  AST 98* 88* 57* 58* 54*  ALT 138* 121* 92* 90* 84*  ALKPHOS 72 64 60 59 62  BILITOT 0.9 1.0 0.8 0.7 0.4  PROT 8.0 7.2 7.0 7.5 7.1  ALBUMIN 3.9 3.4* 3.2* 3.5 3.3*    Recent Labs Lab 05/16/15 1932  LIPASE 25   No results for input(s): AMMONIA in the last 168 hours. CBC:  Recent Labs Lab 05/16/15 1928 05/17/15 0523 05/18/15 0400 05/19/15 0650 05/20/15 0545  WBC 9.7 8.0 6.9 8.2 8.9  NEUTROABS  --   --  3.8 5.2  --   HGB 15.6 14.3 13.7 14.3 14.3  HCT 47.9 42.2 42.2 43.8 41.8  MCV 96.6 92.5 96.1 95.4 91.3  PLT 224 191 157 195 186   Cardiac Enzymes: No results for input(s): CKTOTAL, CKMB, CKMBINDEX, TROPONINI in the last 168 hours. BNP (last 3 results)  Recent Labs  04/01/15 0030  BNP 16.0    ProBNP (last 3 results) No results for input(s): PROBNP in the last 8760 hours.  CBG:  Recent Labs Lab 05/19/15 2153 05/20/15 0802 05/20/15 1150 05/20/15 1652 05/20/15 2127  GLUCAP 140* 132* 123* 161* 90    Recent Results (from the past 240 hour(s))  Culture, Urine     Status: None   Collection Time: 05/19/15  2:44 PM  Result Value Ref Range Status   Specimen  Description URINE, CLEAN CATCH  Final   Special Requests NONE  Final   Culture   Final    NO GROWTH 1 DAY Performed at [facility]    Report Status 05/20/2015 FINAL  Final     Studies: No results found.  Scheduled Meds: . dicyclomine  10 mg Oral TID AC & HS  . gabapentin  800 mg Oral TID  . insulin aspart  0-15 Units Subcutaneous TID WC  . insulin aspart  0-5 Units Subcutaneous QHS  . lisinopril  40 mg Oral BID  . nicotine  21 mg Transdermal Daily  . pantoprazole (PROTONIX) IV  40 mg Intravenous Q12H  . sodium chloride flush  10-40 mL Intracatheter Q12H   Continuous Infusions:    Time spent: 15 minutes    Makyla Bye  [facility] Pager [phone]. If 7PM-7AM, please contact night-coverage at [email or web address], password [facility] 05/21/2015, 6:42 AM  LOS: 4 days

## 2015-05-21 NOTE — Anesthesia Postprocedure Evaluation (Signed)
Anesthesia Post Note  Patient: Bryan Gallegos  Procedure(s) Performed: Procedure(s) (LRB): ESOPHAGOGASTRODUODENOSCOPY (EGD) WITH PROPOFOL (N/A)  Patient location during evaluation: PACU Anesthesia Type: MAC Level of consciousness: awake and alert Pain management: pain level controlled Vital Signs Assessment: post-procedure vital signs reviewed and stable Respiratory status: spontaneous breathing, nonlabored ventilation, respiratory function stable and patient connected to nasal cannula oxygen Cardiovascular status: blood pressure returned to baseline and stable Postop Assessment: no signs of nausea or vomiting Anesthetic complications: no    Last Vitals:  Filed Vitals:   05/21/15 1250 05/21/15 1300  BP:  131/72  Pulse: 85 132  Temp:    Resp: 17 14    Last Pain:  Filed Vitals:   05/21/15 1412  PainSc: 10-Worst pain ever                 Jveon Pound L

## 2015-05-21 NOTE — Progress Notes (Signed)
[facility] PROGRESS NOTE  Bryan Gallegos [id number] DOB: 06/10/1975 DOA: 05/16/2015 PCP: No PCP Per Patient  Summary Bryan Gallegos is a 40 year old male with morbid obesity/type 2 diabetes mellitus/essential hypertension/opioid dependency/benzodiazepine dependency, who came in with right upper quadrant abdominal pain especially with food and he was found to have cholelithiasis(shown on ultrasound) with transaminitis worse than baseline level therefore question of gallbladder colic.  HIDA scan was unremarkable. He was seen by general surgery who recommended gi evaluation.  S/p EGD   Plan Intractable vomiting/Abdominal pain/Cholelithiasis/Gastroesophageal reflux disease with esophagitis  IV fluids/antiemetics/analgesics/PPI  Appreciate general surgery  Follow GI recommendations- await [facility] HIDA scan after normal EgD  Chronic back pain greater than 3 months duration/Narcotic dependency  Pain meds as needed  OSA (obstructive sleep apnea)/Asthma, mild persistent/Diabetes mellitus type II, controlled (HCC)/Morbid obesity (HCC)/Essential hypertension  No acute changes    Code Status: Full code Family Communication: no family at bedside Disposition Plan:    Consultants:  General surgery  GI  Procedures:    Antibiotics:  None  HPI/Subjective: Laying in window seat  Objective: Filed Vitals:   05/21/15 1250 05/21/15 1300  BP:  131/72  Pulse: 85 132  Temp:    Resp: 17 14    Intake/Output Summary (Last 24 hours) at 05/21/15 1321 Last data filed at 05/21/15 1247  Gross per 24 hour  Intake    200 ml  Output      0 ml  Net    200 ml   Filed Weights   05/17/15 0149  Weight: 173.592 kg (382 lb 11.2 oz)    Exam:   General:  Comfortable at rest.  Cardiovascular: S1-S2 normal. No murmurs. Pulse regular.  Respiratory: Good air entry bilaterally. No rhonchi or rales.  Abdomen: Soft and nontender. Normal bowel sounds. No  organomegaly.  Musculoskeletal: No pedal edema    Data Reviewed: Basic Metabolic Panel:  Recent Labs Lab 05/16/15 1928 05/17/15 0523 05/18/15 0400 05/19/15 0650 05/20/15 0545  NA 138 138 139 140 140  K 4.7 4.3 4.2 4.3 4.2  CL 101 100* 104 104 104  CO2 29 30 27 27 28   GLUCOSE 110* 104* 134* 150* 140*  BUN 13 12 12 13 14   CREATININE 0.97 0.89 0.86 0.98 1.01  CALCIUM 9.2 8.8* 8.5* 9.0 9.1   Liver Function Tests:  Recent Labs Lab 05/16/15 1928 05/17/15 0523 05/18/15 0400 05/19/15 0650 05/20/15 0545  AST 98* 88* 57* 58* 54*  ALT 138* 121* 92* 90* 84*  ALKPHOS 72 64 60 59 62  BILITOT 0.9 1.0 0.8 0.7 0.4  PROT 8.0 7.2 7.0 7.5 7.1  ALBUMIN 3.9 3.4* 3.2* 3.5 3.3*    Recent Labs Lab 05/16/15 1932  LIPASE 25   No results for input(s): AMMONIA in the last 168 hours. CBC:  Recent Labs Lab 05/16/15 1928 05/17/15 0523 05/18/15 0400 05/19/15 0650 05/20/15 0545  WBC 9.7 8.0 6.9 8.2 8.9  NEUTROABS  --   --  3.8 5.2  --   HGB 15.6 14.3 13.7 14.3 14.3  HCT 47.9 42.2 42.2 43.8 41.8  MCV 96.6 92.5 96.1 95.4 91.3  PLT 224 191 157 195 186   Cardiac Enzymes: No results for input(s): CKTOTAL, CKMB, CKMBINDEX, TROPONINI in the last 168 hours. BNP (last 3 results)  Recent Labs  04/01/15 0030  BNP 16.0    ProBNP (last 3 results) No results for input(s): PROBNP in the last 8760 hours.  CBG:  Recent Labs Lab 05/20/15 0802 05/20/15 1150 05/20/15  1652 05/20/15 2127 05/21/15 0730  GLUCAP 132* 123* 161* 90 126*    Recent Results (from the past 240 hour(s))  Culture, Urine     Status: None   Collection Time: 05/19/15  2:44 PM  Result Value Ref Range Status   Specimen Description URINE, CLEAN CATCH  Final   Special Requests NONE  Final   Culture   Final    NO GROWTH 1 DAY Performed at [facility]    Report Status 05/20/2015 FINAL  Final     Studies: No results found.  Scheduled Meds: . [MAR Hold] dicyclomine  10 mg Oral TID AC & HS  . [MAR  Hold] gabapentin  800 mg Oral TID  . [MAR Hold] insulin aspart  0-15 Units Subcutaneous TID WC  . [MAR Hold] insulin aspart  0-5 Units Subcutaneous QHS  . [MAR Hold] lisinopril  40 mg Oral BID  . [MAR Hold] nicotine  21 mg Transdermal Daily  . ondansetron  8 mg Oral TID AC & HS  . [MAR Hold] pantoprazole (PROTONIX) IV  40 mg Intravenous Q12H  . [MAR Hold] sodium chloride flush  10-40 mL Intracatheter Q12H   Continuous Infusions:    Time spent: 15 minutes    Bryan Gallegos [facility]  [facility] Pager [phone]. If 7PM-7AM, please contact night-coverage at [email or web address], password [facility] 05/21/2015, 1:21 PM  LOS: 4 days

## 2015-05-21 NOTE — Transfer of Care (Signed)
Immediate Anesthesia Transfer of Care Note  Patient: Bryan Gallegos  Procedure(s) Performed: Procedure(s): ESOPHAGOGASTRODUODENOSCOPY (EGD) WITH PROPOFOL (N/A)  Patient Location: PACU and Endoscopy Unit  Anesthesia Type:MAC  Level of Consciousness: awake and alert   Airway & Oxygen Therapy: Patient Spontanous Breathing and Patient connected to nasal cannula oxygen  Post-op Assessment: Report given to RN and Post -op Vital signs reviewed and stable  Post vital signs: Reviewed and stable  Last Vitals:  Filed Vitals:   05/21/15 0559 05/21/15 1111  BP: 118/75 135/84  Pulse: 89 77  Temp: 37.1 C 36.7 C  Resp: 18 13    Complications: No apparent anesthesia complications

## 2015-05-21 NOTE — Consult Note (Signed)
 Consultation  Referring Provider: [facility] hospitalist- Patel Primary Care Physician:  No PCP Per Patient Primary Gastroenterologist:  None/unassigned.  Reason for Consultation:   Abdominal pain, nausea/vomiting  HPI: Bryan Gallegos is a 39 y.o. male  admitted on 05/16/2015 with complaints of nausea, vomiting and upper abdominal pain. Patient has history of morbid obesity, non-insulin-dependent diabetes mellitus, hypertension and opioid/heroin dependence. Review of his records also shows that he has it again psychiatric history. It appears he was incarcerated in February 2017 and told them he swallowed a battery so was taken to [facility] and had EGD per Dr. Rehman on 04/05/2015. This was a incomplete exam which was aborted because he could not be adequately sedated. Patient now tells me never swallowed a battery he just wanted to get out of prison. He also had a readmission later in February 2017 after a heroin overdose and then his stay at behavioral health. Records show diagnosis of bipolar disorder and another record shows antisocial personality disorder. At the time of this admission upper abdominal ultrasound was done which shows a 1.4 cm gallstone in the gallbladder but no gallbladder wall thickening or ductal dilation. Subsequent HIDA scan was negative. Patient has been evaluated by surgery regarding possible cholecystectomy but they do not feel that his gallbladder is necessarily what is causing his pain or nausea. Patient does have mildly elevated LFTs but these have been chronically elevated and another note states he had given a diagnosis of hepatitis C patient seems unaware of this. We are asked to see the patient regarding possible EGD and further GI workup. Patient says he has been having abdominal pain off and on for a few years but over the past couple of weeks has developed more constant right upper quadrant and epigastric pain and has been vomiting on a regular basis after  meals. Several episodes of vomiting are self reported and not witness by staff. No regular heartburn or indigestion occasionally has some vague solid food dysphagia. He says he is still having pain and vomiting regularly while in the hospital. He is worried because multiple family members have required cholecystectomy including his 19-year-old son. There's apparently also a family history of inflammatory bowel disease. I see that CT of the abdomen and pelvis had been done in January 2017 without contrast and was negative. CT abdomen and pelvis with contrast this admission also negative with the exception of fatty liver. No complaints of melena or hematochezia though when asked he states that he did throw up a small amount of dark blood prior to admission and once since admission.   Past Medical History  Diagnosis Date  . Heart valve disorder     s/p echocardiogram  . Hypertension   . Asthma   . Diabetes mellitus   . Hyperlipidemia   . Anaphylactic reaction   . Bipolar disorder (HCC)   . Adult ADHD   . Morbidly obese (HCC)   . Transient cerebral ischemia     Unknown  . OSA (obstructive sleep apnea)   . Anxiety   . Complication of anesthesia     Per patient difficult intubation;  . Difficult intubation     Per patient    Past Surgical History  Procedure Laterality Date  . Carpal tunnel release    . Nose surgery    . Wisdom tooth extraction    . Tooth extraction    . Esophagogastroduodenoscopy N/A 04/05/2015    Procedure: ESOPHAGOGASTRODUODENOSCOPY (EGD);  Surgeon: Najeeb U Rehman, MD;    Location: [facility];  Service: Endoscopy;  Laterality: N/A;    Prior to Admission medications   Medication Sig Start Date End Date Taking? Authorizing Provider  albuterol (PROVENTIL HFA;VENTOLIN HFA) 108 (90 Base) MCG/ACT inhaler Inhale 2 puffs into the lungs every 6 (six) hours as needed for wheezing or shortness of breath. 04/11/15  Yes Andrea Hernandez-Gonzalez, MD  ALPRAZolam (XANAX) 1 MG  tablet Take 1 mg by mouth 3 (three) times daily.   Yes Historical Provider, MD  aspirin 325 MG tablet Take 1 tablet (325 mg total) by mouth daily. 04/11/15  Yes Andrea Hernandez-Gonzalez, MD  cloNIDine (CATAPRES) 0.2 MG tablet Take 0.2 mg by mouth 2 (two) times daily.   Yes Historical Provider, MD  dexlansoprazole (DEXILANT) 60 MG capsule Take 60 mg by mouth daily.   Yes Historical Provider, MD  furosemide (LASIX) 20 MG tablet Take 1 tablet (20 mg total) by mouth daily. 04/11/15  Yes Andrea Hernandez-Gonzalez, MD  gabapentin (NEURONTIN) 400 MG capsule Take 2 capsules (800 mg total) by mouth 3 (three) times daily. 04/11/15  Yes Andrea Hernandez-Gonzalez, MD  hydrOXYzine (VISTARIL) 50 MG capsule Take 1 capsule (50 mg total) by mouth 3 (three) times daily as needed for anxiety. 04/11/15  Yes Andrea Hernandez-Gonzalez, MD  lisinopril (PRINIVIL,ZESTRIL) 40 MG tablet Take 1 tablet (40 mg total) by mouth 2 (two) times daily. 04/11/15  Yes Andrea Hernandez-Gonzalez, MD  oxycodone (ROXICODONE) 30 MG immediate release tablet Take 30 mg by mouth every 4 (four) hours as needed for pain.   Yes Historical Provider, MD  sitaGLIPtin-metformin (JANUMET) 50-1000 MG tablet Take 1 tablet by mouth 2 (two) times daily with a meal.   Yes Historical Provider, MD  zolpidem (AMBIEN) 10 MG tablet Take 10 mg by mouth at bedtime.   Yes Historical Provider, MD  Naloxone HCl 4 MG/10ML SOLN Inject 4 mg as directed once as needed (in event of). 04/12/15   Sendil K Krishnan, MD    Current Facility-Administered Medications  Medication Dose Route Frequency Provider Last Rate Last Dose  . albuterol (PROVENTIL) (2.5 MG/3ML) 0.083% nebulizer solution 2.5 mg  2.5 mg Nebulization Q6H PRN Christopher P Danford, MD      . ALPRAZolam (XANAX) tablet 1 mg  1 mg Oral TID PRN Simbiso Ranga, MD   1 mg at 05/21/15 0023  . dicyclomine (BENTYL) capsule 10 mg  10 mg Oral TID AC & HS Christopher P Danford, MD   10 mg at 05/21/15 0831  . gabapentin  (NEURONTIN) capsule 800 mg  800 mg Oral TID Christopher P Danford, MD   800 mg at 05/20/15 2126  . hydrOXYzine (ATARAX/VISTARIL) tablet 50 mg  50 mg Oral TID PRN Christopher P Danford, MD   50 mg at 05/17/15 1310  . insulin aspart (novoLOG) injection 0-15 Units  0-15 Units Subcutaneous TID WC Christopher P Danford, MD   2 Units at 05/21/15 0831  . insulin aspart (novoLOG) injection 0-5 Units  0-5 Units Subcutaneous QHS Christopher P Danford, MD   0 Units at 05/17/15 2200  . lisinopril (PRINIVIL,ZESTRIL) tablet 40 mg  40 mg Oral BID Christopher P Danford, MD   40 mg at 05/20/15 2127  . morphine 2 MG/ML injection 2 mg  2 mg Intravenous Q2H PRN Simbiso Ranga, MD   2 mg at 05/21/15 0831  . nicotine (NICODERM CQ - dosed in mg/24 hours) patch 21 mg  21 mg Transdermal Daily Thomas Michael Callahan, NP   21 mg at 05/20/15 0919  . ondansetron (  ZOFRAN) tablet 8 mg  8 mg Oral Q8H PRN Christopher P Danford, MD       Or  . ondansetron (ZOFRAN) injection 4 mg  4 mg Intravenous Q6H PRN Christopher P Danford, MD   4 mg at 05/19/15 0817  . oxyCODONE (Oxy IR/ROXICODONE) immediate release tablet 15 mg  15 mg Oral Q4H PRN Simbiso Ranga, MD   15 mg at 05/20/15 2138  . pantoprazole (PROTONIX) injection 40 mg  40 mg Intravenous Q12H Simbiso Ranga, MD   40 mg at 05/20/15 2128  . prochlorperazine (COMPAZINE) injection 10 mg  10 mg Intravenous Q6H PRN Christopher P Danford, MD      . sodium chloride flush (NS) 0.9 % injection 10-40 mL  10-40 mL Intracatheter Q12H Simbiso Ranga, MD   10 mL at 05/19/15 1005  . sodium chloride flush (NS) 0.9 % injection 10-40 mL  10-40 mL Intracatheter PRN Simbiso Ranga, MD        Allergies as of 05/16/2015 - Review Complete 05/16/2015  Allergen Reaction Noted  . Atenolol Anaphylaxis 06/19/2011  . Other Anaphylaxis 05/29/2011  . Tylenol [acetaminophen] Other (See Comments) 05/29/2011    Family History  Problem Relation Age of Onset  . CAD Mother     Living  . Diabetes Mellitus II  Mother   . Stroke Mother   . Hypertension Mother   . Congestive Heart Failure Mother   . Kidney disease Mother   . Fibromyalgia Mother   . Thyroid disease Mother   . Hyperlipidemia Mother   . Liver disease Mother   . Alcoholism Father 35    Deceased  . Arthritis Maternal Grandmother   . Congestive Heart Failure Maternal Grandmother   . Hypertension Maternal Grandmother   . Lung cancer Maternal Grandfather   . Colon cancer Maternal Aunt   . Stomach cancer Maternal Aunt   . Heart disease Other     Paternal & Maternal  . Hypertension Other     Paternal & Maternal  . Hypertension Brother     x3  . Hypertension Sister     #1  . Bipolar disorder Sister     #1  . ADD / ADHD Son     x3  . Bipolar disorder Son     x3  . Asperger's syndrome Son     Social History   Social History  . Marital Status: Single    Spouse Name: N/A  . Number of Children: N/A  . Years of Education: N/A   Occupational History  . Not on file.   Social History Main Topics  . Smoking status: Current Every Day Smoker -- 1.00 packs/day for 23 years    Types: Cigarettes  . Smokeless tobacco: Never Used  . Alcohol Use: 0.0 oz/week    0 Standard drinks or equivalent per week     Comment: drank 7 beers on Thanksgiving Day 2016, last drank one yr before that  . Drug Use: Yes    Special: Benzodiazepines, Heroin     Comment: Patient denied use.  . Sexual Activity:    Partners: Female    Birth Control/ Protection: None   Other Topics Concern  . Not on file   Social History Narrative    Review of Systems: Pertinent positive and negative review of systems were noted in the above HPI section.  All other review of systems was otherwise negative.  Physical Exam: Vital signs in last 24 hours: Temp:  [98 F (36.7 C)-99.3 F (37.4 C)]   98.7 F (37.1 C) (03/22 0559) Pulse Rate:  [89-103] 89 (03/22 0559) Resp:  [18] 18 (03/22 0559) BP: (115-129)/(67-75) 118/75 mmHg (03/22 0559) SpO2:  [93 %-97 %] 97  % (03/22 0559) Last BM Date: 05/18/15 General:   Alert,  Well-developed, well-nourished,morbidly obese WM pleasant and cooperative in NAD Head:  Normocephalic and atraumatic. Eyes:  Sclera clear, no icterus.   Conjunctiva pink. Ears:  Normal auditory acuity. Nose:  No deformity, discharge,  or lesions. Mouth:  No deformity or lesions.   Neck:  Supple; no masses or thyromegaly. Lungs:  Clear throughout to auscultation.   No wheezes, crackles, or rhonchi. Heart:  Regular rate and rhythm; no murmurs, clicks, rubs,  or gallops. Abdomen:  Massively obese , soft,, no focal tenderness, BS active,nonpalp mass or hsm.   Rectal:  Deferred  Msk:  Symmetrical without gross deformities. . Pulses:  Normal pulses noted. Extremities:  Without clubbing or edema.stasis changes Neurologic:  Alert and  oriented x4;  grossly normal neurologically. Skin:  Intact without significant lesions or rashes.. Psych:  Alert and cooperative. Normal mood and affect.  Intake/Output from previous day: 03/21 0701 - 03/22 0700 In: 360 [P.O.:360] Out: 0  Intake/Output this shift:    Lab Results:  Recent Labs  05/19/15 0650 05/20/15 0545  WBC 8.2 8.9  HGB 14.3 14.3  HCT 43.8 41.8  PLT 195 186   BMET  Recent Labs  05/19/15 0650 05/20/15 0545  NA 140 140  K 4.3 4.2  CL 104 104  CO2 27 28  GLUCOSE 150* 140*  BUN 13 14  CREATININE 0.98 1.01  CALCIUM 9.0 9.1   LFT  Recent Labs  05/20/15 0545  PROT 7.1  ALBUMIN 3.3*  AST 54*  ALT 84*  ALKPHOS 62  BILITOT 0.4   PT/INR No results for input(s): LABPROT, INR in the last 72 hours. Hepatitis Panel No results for input(s): HEPBSAG, HCVAB, HEPAIGM, HEPBIGM in the last 72 hours.    IMPRESSION:   #1 39 yo white male morbidly obese, admitted 6 days ago with complaints of upper abdominal pain nausea and vomiting. Workup thus far only pertinent for cholelithiasis and symptoms not convincing of biliary colic. Rule out biliary dyskinesia, rule out  peptic ulcer disease, rule out malingering. #2 narcotic dependency/history of heroin abuse-and overdose February 2017 #3 non-insulin-dependent diabetes mellitus #4 sleep apnea #5 hypertension #6 chronic mild elevation of LFTs-I don't see hep C serologies though a prior notes suggest he may have hep C versus hepatic steatosis #7 bipolar disorder-antisocial personality disorder #8 previous diagnosis of malingering  Plan: Check hepatitis C antibody Have scheduled for EGD today with Dr. Yuktha Kerchner with [facility]-procedure discussed in detail with the patient and his wife and they're agreeable to proceed If EGD is unremarkable would proceed with CCK HIDA scan Continue PPI daily Would not pursue further GI workup if the above studies are unrevealing.   Amy Esterwood  05/21/2015, 8:35 AM     Attending physician's note   I have taken a history, examined the patient and reviewed the chart. I agree with the Advanced Practitioner's note, impression and recommendations. RUQ and epigastric pain with nausea and vomiting. History of Narcotic dependence, heroin abuse and psychiatric issues. Cholelithiasis but not clear his symptoms are biliary. R/O ulcer, esophagitis, functional symptoms. EGD today and CCK HIDA if EGD is not diagnostic.   Bryan Rosete, MD FACG [phone] Mon-Fri 8a-5p [phone] after 5p, weekends, holidays     

## 2015-05-21 NOTE — Interval H&P Note (Signed)
History and Physical Interval Note:  05/21/2015 12:15 PM  Bryan Gallegos  has presented today for surgery, with the diagnosis of Epigastric pain  The various methods of treatment have been discussed with the patient and family. After consideration of risks, benefits and other options for treatment, the patient has consented to  Procedure(s): ESOPHAGOGASTRODUODENOSCOPY (EGD) WITH PROPOFOL (N/A) as a surgical intervention .  The patient's history has been reviewed, patient examined, no change in status, stable for surgery.  I have reviewed the patient's chart and labs.  Questions were answered to the patient's satisfaction.     Venita LickMalcolm T. Russella DarStark

## 2015-05-21 NOTE — Anesthesia Preprocedure Evaluation (Addendum)
Anesthesia Evaluation  Patient identified by MRN, date of birth, ID band Patient awake    Reviewed: Allergy & Precautions, H&P , NPO status , Patient's Chart, lab work & pertinent test results  History of Anesthesia Complications (+) DIFFICULT AIRWAY  Airway Mallampati: II  TM Distance: >3 FB Neck ROM: full    Dental no notable dental hx. (+) Dental Advisory Given   Pulmonary asthma , sleep apnea , Current Smoker,    Pulmonary exam normal breath sounds clear to auscultation       Cardiovascular hypertension, Pt. on medications Normal cardiovascular exam Rhythm:regular Rate:Normal     Neuro/Psych Anxiety Bipolar Disorder TIAnegative neurological ROS  negative psych ROS   GI/Hepatic negative GI ROS, (+)     substance abuse  , Opioid dependence   Endo/Other  diabetes, Well Controlled, Type 2, Insulin DependentMorbid obesity  Renal/GU negative Renal ROS  negative genitourinary   Musculoskeletal   Abdominal   Peds  Hematology negative hematology ROS (+)   Anesthesia Other Findings   Reproductive/Obstetrics negative OB ROS                            Anesthesia Physical Anesthesia Plan  ASA: IV  Anesthesia Plan: MAC   Post-op Pain Management:    Induction:   Airway Management Planned:   Additional Equipment:   Intra-op Plan:   Post-operative Plan:   Informed Consent: I have reviewed the patients History and Physical, chart, labs and discussed the procedure including the risks, benefits and alternatives for the proposed anesthesia with the patient or authorized representative who has indicated his/her understanding and acceptance.   Dental Advisory Given  Plan Discussed with: CRNA  Anesthesia Plan Comments:        Anesthesia Quick Evaluation

## 2015-05-21 NOTE — H&P (View-Only) (Signed)
Consultation  Referring Provider: [facility] hospitalist- Allena Katz Primary Care Physician:  No PCP Per Patient Primary Gastroenterologist:  None/unassigned.  Reason for Consultation:   Abdominal pain, nausea/vomiting  HPI: Bryan Gallegos is a 40 y.o. male  admitted on 05/16/2015 with complaints of nausea, vomiting and upper abdominal pain. Patient has history of morbid obesity, non-insulin-dependent diabetes mellitus, hypertension and opioid/heroin dependence. Review of his records also shows that he has it again psychiatric history. It appears he was incarcerated in February 2017 and told them he swallowed a battery so was taken to [facility] and had EGD per Dr. Karilyn Cota on 04/05/2015. This was a incomplete exam which was aborted because he could not be adequately sedated. Patient now tells me never swallowed a battery he just wanted to get out of prison. He also had a readmission later in February 2017 after a heroin overdose and then his stay at behavioral health. Records show diagnosis of bipolar disorder and another record shows antisocial personality disorder. At the time of this admission upper abdominal ultrasound was done which shows a 1.4 cm gallstone in the gallbladder but no gallbladder wall thickening or ductal dilation. Subsequent HIDA scan was negative. Patient has been evaluated by surgery regarding possible cholecystectomy but they do not feel that his gallbladder is necessarily what is causing his pain or nausea. Patient does have mildly elevated LFTs but these have been chronically elevated and another note states he had given a diagnosis of hepatitis C patient seems unaware of this. We are asked to see the patient regarding possible EGD and further GI workup. Patient says he has been having abdominal pain off and on for a few years but over the past couple of weeks has developed more constant right upper quadrant and epigastric pain and has been vomiting on a regular basis after  meals. Several episodes of vomiting are self reported and not witness by staff. No regular heartburn or indigestion occasionally has some vague solid food dysphagia. He says he is still having pain and vomiting regularly while in the hospital. He is worried because multiple family members have required cholecystectomy including his 30 year old son. There's apparently also a family history of inflammatory bowel disease. I see that CT of the abdomen and pelvis had been done in January 2017 without contrast and was negative. CT abdomen and pelvis with contrast this admission also negative with the exception of fatty liver. No complaints of melena or hematochezia though when asked he states that he did throw up a small amount of dark blood prior to admission and once since admission.   Past Medical History  Diagnosis Date  . Heart valve disorder     s/p echocardiogram  . Hypertension   . Asthma   . Diabetes mellitus   . Hyperlipidemia   . Anaphylactic reaction   . Bipolar disorder (HCC)   . Adult ADHD   . Morbidly obese (HCC)   . Transient cerebral ischemia     Unknown  . OSA (obstructive sleep apnea)   . Anxiety   . Complication of anesthesia     Per patient difficult intubation;  . Difficult intubation     Per patient    Past Surgical History  Procedure Laterality Date  . Carpal tunnel release    . Nose surgery    . Wisdom tooth extraction    . Tooth extraction    . Esophagogastroduodenoscopy N/A 04/05/2015    Procedure: ESOPHAGOGASTRODUODENOSCOPY (EGD);  Surgeon: Malissa Hippo, MD;  Location: [facility];  Service: Endoscopy;  Laterality: N/A;    Prior to Admission medications   Medication Sig Start Date End Date Taking? Authorizing Provider  albuterol (PROVENTIL HFA;VENTOLIN HFA) 108 (90 Base) MCG/ACT inhaler Inhale 2 puffs into the lungs every 6 (six) hours as needed for wheezing or shortness of breath. 04/11/15  Yes Jimmy FootmanAndrea Hernandez-Gonzalez, MD  ALPRAZolam Prudy Feeler(XANAX) 1 MG  tablet Take 1 mg by mouth 3 (three) times daily.   Yes Historical Provider, MD  aspirin 325 MG tablet Take 1 tablet (325 mg total) by mouth daily. 04/11/15  Yes Jimmy FootmanAndrea Hernandez-Gonzalez, MD  cloNIDine (CATAPRES) 0.2 MG tablet Take 0.2 mg by mouth 2 (two) times daily.   Yes Historical Provider, MD  dexlansoprazole (DEXILANT) 60 MG capsule Take 60 mg by mouth daily.   Yes Historical Provider, MD  furosemide (LASIX) 20 MG tablet Take 1 tablet (20 mg total) by mouth daily. 04/11/15  Yes Jimmy FootmanAndrea Hernandez-Gonzalez, MD  gabapentin (NEURONTIN) 400 MG capsule Take 2 capsules (800 mg total) by mouth 3 (three) times daily. 04/11/15  Yes Jimmy FootmanAndrea Hernandez-Gonzalez, MD  hydrOXYzine (VISTARIL) 50 MG capsule Take 1 capsule (50 mg total) by mouth 3 (three) times daily as needed for anxiety. 04/11/15  Yes Jimmy FootmanAndrea Hernandez-Gonzalez, MD  lisinopril (PRINIVIL,ZESTRIL) 40 MG tablet Take 1 tablet (40 mg total) by mouth 2 (two) times daily. 04/11/15  Yes Jimmy FootmanAndrea Hernandez-Gonzalez, MD  oxycodone (ROXICODONE) 30 MG immediate release tablet Take 30 mg by mouth every 4 (four) hours as needed for pain.   Yes Historical Provider, MD  sitaGLIPtin-metformin (JANUMET) 50-1000 MG tablet Take 1 tablet by mouth 2 (two) times daily with a meal.   Yes Historical Provider, MD  zolpidem (AMBIEN) 10 MG tablet Take 10 mg by mouth at bedtime.   Yes Historical Provider, MD  Naloxone HCl 4 MG/10ML SOLN Inject 4 mg as directed once as needed (in event of). 04/12/15   Hollice EspySendil K Krishnan, MD    Current Facility-Administered Medications  Medication Dose Route Frequency Provider Last Rate Last Dose  . albuterol (PROVENTIL) (2.5 MG/3ML) 0.083% nebulizer solution 2.5 mg  2.5 mg Nebulization Q6H PRN Alberteen Samhristopher P Danford, MD      . ALPRAZolam Prudy Feeler(XANAX) tablet 1 mg  1 mg Oral TID PRN Conley CanalSimbiso Ranga, MD   1 mg at 05/21/15 0023  . dicyclomine (BENTYL) capsule 10 mg  10 mg Oral TID AC & HS Alberteen Samhristopher P Danford, MD   10 mg at 05/21/15 0831  . gabapentin  (NEURONTIN) capsule 800 mg  800 mg Oral TID Alberteen Samhristopher P Danford, MD   800 mg at 05/20/15 2126  . hydrOXYzine (ATARAX/VISTARIL) tablet 50 mg  50 mg Oral TID PRN Alberteen Samhristopher P Danford, MD   50 mg at 05/17/15 1310  . insulin aspart (novoLOG) injection 0-15 Units  0-15 Units Subcutaneous TID WC Alberteen Samhristopher P Danford, MD   2 Units at 05/21/15 0831  . insulin aspart (novoLOG) injection 0-5 Units  0-5 Units Subcutaneous QHS Alberteen Samhristopher P Danford, MD   0 Units at 05/17/15 2200  . lisinopril (PRINIVIL,ZESTRIL) tablet 40 mg  40 mg Oral BID Alberteen Samhristopher P Danford, MD   40 mg at 05/20/15 2127  . morphine 2 MG/ML injection 2 mg  2 mg Intravenous Q2H PRN Simbiso Ranga, MD   2 mg at 05/21/15 0831  . nicotine (NICODERM CQ - dosed in mg/24 hours) patch 21 mg  21 mg Transdermal Daily Rolan Lipahomas Michael Callahan, NP   21 mg at 05/20/15 0919  . ondansetron (  ZOFRAN) tablet 8 mg  8 mg Oral Q8H PRN Alberteen Sam, MD       Or  . ondansetron (ZOFRAN) injection 4 mg  4 mg Intravenous Q6H PRN Alberteen Sam, MD   4 mg at 05/19/15 0817  . oxyCODONE (Oxy IR/ROXICODONE) immediate release tablet 15 mg  15 mg Oral Q4H PRN Simbiso Ranga, MD   15 mg at 05/20/15 2138  . pantoprazole (PROTONIX) injection 40 mg  40 mg Intravenous Q12H Simbiso Ranga, MD   40 mg at 05/20/15 2128  . prochlorperazine (COMPAZINE) injection 10 mg  10 mg Intravenous Q6H PRN Alberteen Sam, MD      . sodium chloride flush (NS) 0.9 % injection 10-40 mL  10-40 mL Intracatheter Q12H Simbiso Ranga, MD   10 mL at 05/19/15 1005  . sodium chloride flush (NS) 0.9 % injection 10-40 mL  10-40 mL Intracatheter PRN Conley Canal, MD        Allergies as of 05/16/2015 - Review Complete 05/16/2015  Allergen Reaction Noted  . Atenolol Anaphylaxis 06/19/2011  . Other Anaphylaxis 05/29/2011  . Tylenol [acetaminophen] Other (See Comments) 05/29/2011    Family History  Problem Relation Age of Onset  . CAD Mother     Living  . Diabetes Mellitus II  Mother   . Stroke Mother   . Hypertension Mother   . Congestive Heart Failure Mother   . Kidney disease Mother   . Fibromyalgia Mother   . Thyroid disease Mother   . Hyperlipidemia Mother   . Liver disease Mother   . Alcoholism Father 11    Deceased  . Arthritis Maternal Grandmother   . Congestive Heart Failure Maternal Grandmother   . Hypertension Maternal Grandmother   . Lung cancer Maternal Grandfather   . Colon cancer Maternal Aunt   . Stomach cancer Maternal Aunt   . Heart disease Other     Paternal & Maternal  . Hypertension Other     Paternal & Maternal  . Hypertension Brother     x3  . Hypertension Sister     #1  . Bipolar disorder Sister     #1  . ADD / ADHD Son     x3  . Bipolar disorder Son     x3  . Asperger's syndrome Son     Social History   Social History  . Marital Status: Single    Spouse Name: N/A  . Number of Children: N/A  . Years of Education: N/A   Occupational History  . Not on file.   Social History Main Topics  . Smoking status: Current Every Day Smoker -- 1.00 packs/day for 23 years    Types: Cigarettes  . Smokeless tobacco: Never Used  . Alcohol Use: 0.0 oz/week    0 Standard drinks or equivalent per week     Comment: drank 7 beers on Thanksgiving Day 2016, last drank one yr before that  . Drug Use: Yes    Special: Benzodiazepines, Heroin     Comment: Patient denied use.  Marland Kitchen Sexual Activity:    Partners: Female    Copy: None   Other Topics Concern  . Not on file   Social History Narrative    Review of Systems: Pertinent positive and negative review of systems were noted in the above HPI section.  All other review of systems was otherwise negative.  Physical Exam: Vital signs in last 24 hours: Temp:  [98 F (36.7 C)-99.3 F (37.4 C)]  98.7 F (37.1 C) (03/22 0559) Pulse Rate:  [89-103] 89 (03/22 0559) Resp:  [18] 18 (03/22 0559) BP: (115-129)/(67-75) 118/75 mmHg (03/22 0559) SpO2:  [93 %-97 %] 97  % (03/22 0559) Last BM Date: 05/18/15 General:   Alert,  Well-developed, well-nourished,morbidly obese WM pleasant and cooperative in NAD Head:  Normocephalic and atraumatic. Eyes:  Sclera clear, no icterus.   Conjunctiva pink. Ears:  Normal auditory acuity. Nose:  No deformity, discharge,  or lesions. Mouth:  No deformity or lesions.   Neck:  Supple; no masses or thyromegaly. Lungs:  Clear throughout to auscultation.   No wheezes, crackles, or rhonchi. Heart:  Regular rate and rhythm; no murmurs, clicks, rubs,  or gallops. Abdomen:  Massively obese , soft,, no focal tenderness, BS active,nonpalp mass or hsm.   Rectal:  Deferred  Msk:  Symmetrical without gross deformities. . Pulses:  Normal pulses noted. Extremities:  Without clubbing or edema.stasis changes Neurologic:  Alert and  oriented x4;  grossly normal neurologically. Skin:  Intact without significant lesions or rashes.. Psych:  Alert and cooperative. Normal mood and affect.  Intake/Output from previous day: 03/21 0701 - 03/22 0700 In: 360 [P.O.:360] Out: 0  Intake/Output this shift:    Lab Results:  Recent Labs  05/19/15 0650 05/20/15 0545  WBC 8.2 8.9  HGB 14.3 14.3  HCT 43.8 41.8  PLT 195 186   BMET  Recent Labs  05/19/15 0650 05/20/15 0545  NA 140 140  K 4.3 4.2  CL 104 104  CO2 27 28  GLUCOSE 150* 140*  BUN 13 14  CREATININE 0.98 1.01  CALCIUM 9.0 9.1   LFT  Recent Labs  05/20/15 0545  PROT 7.1  ALBUMIN 3.3*  AST 54*  ALT 84*  ALKPHOS 62  BILITOT 0.4   PT/INR No results for input(s): LABPROT, INR in the last 72 hours. Hepatitis Panel No results for input(s): HEPBSAG, HCVAB, HEPAIGM, HEPBIGM in the last 72 hours.    IMPRESSION:   #1 40 yo white male morbidly obese, admitted 6 days ago with complaints of upper abdominal pain nausea and vomiting. Workup thus far only pertinent for cholelithiasis and symptoms not convincing of biliary colic. Rule out biliary dyskinesia, rule out  peptic ulcer disease, rule out malingering. #2 narcotic dependency/history of heroin abuse-and overdose February 2017 #3 non-insulin-dependent diabetes mellitus #4 sleep apnea #5 hypertension #6 chronic mild elevation of LFTs-I don't see hep C serologies though a prior notes suggest he may have hep C versus hepatic steatosis #7 bipolar disorder-antisocial personality disorder #8 previous diagnosis of malingering  Plan: Check hepatitis C antibody Have scheduled for EGD today with Dr. Russella Dar with [facility]-procedure discussed in detail with the patient and his wife and they're agreeable to proceed If EGD is unremarkable would proceed with CCK HIDA scan Continue PPI daily Would not pursue further GI workup if the above studies are unrevealing.   Amy Esterwood  05/21/2015, 8:35 AM     Attending physician's note   I have taken a history, examined the patient and reviewed the chart. I agree with the Advanced Practitioner's note, impression and recommendations. RUQ and epigastric pain with nausea and vomiting. History of Narcotic dependence, heroin abuse and psychiatric issues. Cholelithiasis but not clear his symptoms are biliary. R/O ulcer, esophagitis, functional symptoms. EGD today and CCK HIDA if EGD is not diagnostic.   Claudette Head, MD Clementeen Graham [phone] Mon-Fri 8a-5p [phone] after 5p, weekends, holidays

## 2015-05-22 ENCOUNTER — Encounter (HOSPITAL_COMMUNITY): Payer: Self-pay | Admitting: Gastroenterology

## 2015-05-22 ENCOUNTER — Inpatient Hospital Stay (HOSPITAL_COMMUNITY): Payer: Medicare Other

## 2015-05-22 DIAGNOSIS — M549 Dorsalgia, unspecified: Secondary | ICD-10-CM

## 2015-05-22 DIAGNOSIS — G8929 Other chronic pain: Secondary | ICD-10-CM

## 2015-05-22 LAB — HEPATITIS PANEL, ACUTE
HEP A IGM: NEGATIVE
Hep B C IgM: NEGATIVE
Hepatitis B Surface Ag: NEGATIVE

## 2015-05-22 LAB — GLUCOSE, CAPILLARY
Glucose-Capillary: 139 mg/dL — ABNORMAL HIGH (ref 65–99)
Glucose-Capillary: 112 mg/dL — ABNORMAL HIGH (ref 65–99)

## 2015-05-22 MED ORDER — ONDANSETRON HCL 8 MG PO TABS: 8.0000 mg | ORAL_TABLET | Freq: Three times a day (TID) | ORAL | Status: DC

## 2015-05-22 MED ORDER — SINCALIDE 5 MCG IJ SOLR
0.0200 ug/kg | Freq: Once | INTRAMUSCULAR | Status: DC
  Filled 2015-05-22: qty 5

## 2015-05-22 MED ORDER — TECHNETIUM TC 99M MEBROFENIN IV KIT
5.4000 | PACK | Freq: Once | INTRAVENOUS | Status: AC | PRN
  Administered 2015-05-22: 5 via INTRAVENOUS

## 2015-05-22 NOTE — Discharge Summary (Addendum)
Physician Discharge Summary  Bryan Gallegos ZOX:[id number] DOB: 09/21/1975 DOA: 05/16/2015  PCP: No PCP Per Patient  Admit date: 05/16/2015 Discharge date: 05/22/2015  Pt has been discharged form [facility], and will not be followed by [facility] or [facility] as an outpt  Time spent: 35 minutes  Recommendations for Outpatient Follow-up:  1. Establish with PCP and GI of choice but not [place] 2. CMP 1 week 3. Outpatient HCV Nucleic Acid Amplification test    Discharge Diagnoses:  Principal Problem:   Intractable vomiting Active Problems:   OSA (obstructive sleep apnea)   Chronic back pain greater than 3 months duration   Gastroesophageal reflux disease with esophagitis   Asthma, mild persistent   Diabetes mellitus type II, controlled (HCC)   Morbid obesity (HCC)   Narcotic dependency, continuous (HCC)   Essential hypertension   Abdominal pain   Cholelithiasis   Epigastric pain   RUQ abdominal pain   Discharge Condition: improved  Diet recommendation: cardiac  Filed Weights   05/17/15 0149 05/22/15 1300  Weight: 173.592 kg (382 lb 11.2 oz) 173.274 kg (382 lb)    History of present illness:  Bryan DuosMitchell L Goon is a 40 y.o. male with a past medical history significant for NIDDM, HTN, obesity, and opioid/heroin dependence who presents with nausea vomiting.  The patient says that he was in his usual state of health until about one week ago when he started to vomit, this continued all week, until 2 days ago he started to have abdominal pain. This is cramping, right-sided abdominal pain, worse after eating greasy food, not associated with fever, hematemesis, diarrhea, hematochezia, or melena. This pain persisted and so he came to the ED.  In the ED, the patient was afebrile, hemodynamically stable, hypertensive. Na 138, K4.7, crit 1.0, AST and ALT chronically elevated about 3 times the upper limit of normal, lipase normal, lactate normal, vomiting blood  testing negative, WBC 9K, hemoglobin 15. CT of the abdomen and pelvis with contrast was unremarkable.  The patient notes that his son had gallbladder pain in the past, and says this seems similar. He also notes that he takes oxycodone 30 mg 3-6 times daily for chronic low back and knee pain, that is prescribed [facility], but that he has not been taking any of this for the last week. Chart review shows that he overdosed on heroin 6 weeks ago at this hospital. He was seen by psychiatry, who noted that he deflected from his drug use, and was possibly malingering.  Hospital Course:  Intractable vomiting/Abdominal pain/Cholelithiasis/Gastroesophageal reflux disease with esophagitis  resolved  Appreciate general surgery  Follow GI recommendations- CCK HIDA scan was normal after normal EGD  Chronic back pain greater than 3 months duration/Narcotic dependency  Pain meds as needed  OSA (obstructive sleep apnea)/Asthma, mild persistent/Diabetes mellitus type II, controlled (HCC)/Morbid obesity (HCC)/Essential hypertension  No acute changes   Elevated LFTs -HCV ab positive -outpatient follow up for HCV Nucleic Acid Amplification test   Procedures:  EGD  HIDA  Consultations:  GI  surgery  Discharge Exam: Filed Vitals:   05/22/15 0607 05/22/15 1121  BP: 133/81 142/79  Pulse: 89 88  Temp: 98.3 F (36.8 C) 97.6 F (36.4 C)  Resp: 18 18    General: caught smoking in hallway   Discharge Instructions   Discharge Instructions    Diet - low sodium heart healthy    Complete by:  As directed  Diet Carb Modified    Complete by:  As directed      Increase activity slowly    Complete by:  As directed           Current Discharge Medication List    START taking these medications   Details  ondansetron (ZOFRAN) 8 MG tablet Take 1 tablet (8 mg total) by mouth 4 (four) times daily -  before meals and at bedtime. Qty: 20 tablet, Refills: 0       CONTINUE these medications which have NOT CHANGED   Details  albuterol (PROVENTIL HFA;VENTOLIN HFA) 108 (90 Base) MCG/ACT inhaler Inhale 2 puffs into the lungs every 6 (six) hours as needed for wheezing or shortness of breath. Qty: 1 Inhaler, Refills: 0   Associated Diagnoses: Asthma, mild persistent, uncomplicated    ALPRAZolam (XANAX) 1 MG tablet Take 1 mg by mouth 3 (three) times daily.    aspirin 325 MG tablet Take 1 tablet (325 mg total) by mouth daily. Qty: 30 tablet, Refills: 0    cloNIDine (CATAPRES) 0.2 MG tablet Take 0.2 mg by mouth 2 (two) times daily.    dexlansoprazole (DEXILANT) 60 MG capsule Take 60 mg by mouth daily.    furosemide (LASIX) 20 MG tablet Take 1 tablet (20 mg total) by mouth daily. Qty: 30 tablet, Refills: 0    gabapentin (NEURONTIN) 400 MG capsule Take 2 capsules (800 mg total) by mouth 3 (three) times daily. Qty: 180 capsule, Refills: 0    hydrOXYzine (VISTARIL) 50 MG capsule Take 1 capsule (50 mg total) by mouth 3 (three) times daily as needed for anxiety. Qty: 30 capsule, Refills: 0    lisinopril (PRINIVIL,ZESTRIL) 40 MG tablet Take 1 tablet (40 mg total) by mouth 2 (two) times daily. Qty: 60 tablet, Refills: 0    oxycodone (ROXICODONE) 30 MG immediate release tablet Take 30 mg by mouth every 4 (four) hours as needed for pain.    sitaGLIPtin-metformin (JANUMET) 50-1000 MG tablet Take 1 tablet by mouth 2 (two) times daily with a meal.    zolpidem (AMBIEN) 10 MG tablet Take 10 mg by mouth at bedtime.    Naloxone HCl 4 MG/10ML SOLN Inject 4 mg as directed once as needed (in event of). Qty: 1 vial, Refills: 1       Allergies  Allergen Reactions  . Atenolol Anaphylaxis  . Other Anaphylaxis    "Prednisone like drugs"  . Tylenol [Acetaminophen] Other (See Comments)    GI Bleed   Follow-up Information    Please follow up.   Why:  establish with PCP once medicaid resumed-- will need :CMP and Outpatient HCV Nucleic Acid Amplification test         The results of significant diagnostics from this hospitalization (including imaging, microbiology, ancillary and laboratory) are listed below for reference.    Significant Diagnostic Studies: Nm Hepatobiliary Liver Func  05/18/2015  CLINICAL DATA:  Epigastric and right upper quadrant pain for three years, getting worse over the past three months; known gall stones EXAM: NUCLEAR MEDICINE HEPATOBILIARY IMAGING TECHNIQUE: Sequential images of the abdomen were obtained out to 60 minutes following intravenous administration of radiopharmaceutical. RADIOPHARMACEUTICALS:  5.5 mCi Tc-72m  Choletec IV COMPARISON:  05/17/15 FINDINGS: There is prompt hepatic cellular uptake. Small bowel uptake is seen at 5 minutes. Gallbladder uptake is also seen at 5 minutes. IMPRESSION: This study confirms that the common bile duct and cystic duct are both patent. Electronically Signed   By: Edgar Frisk.D.  On: 05/18/2015 17:53   US Abdomen Complete  05/17/2015  CLINICAL DATA:  Abdominal pain for 3 days EXAM: ABDOMEN ULTRASOUND COMPLETE COMPARISON:  None. FINDINGS: Gallbladder: There is a mobile 1.4 cm calculus within the gallbladder lumen. There is no gallbladder mural thickening or pericholecystic fluid. The patient was not tender to probe pressure over the gallbladder. Common bile duct: Diameter: 5.5 mm, normal. Liver: Increased parenchymal echogenicity consistent with fatty infiltration. No focal liver lesion. IVC: No abnormality visualized. Pancreas: Limited.  Visualized portion unremarkable. Spleen: Size and appearance within normal limits. Right Kidney: Length: 15.4 cm. Echogenicity within normal limits. No mass or hydronephrosis visualized. Left Kidney: Length: 15.0 cm. Echogenicity within normal limits. No mass or hydronephrosis visualized. Abdominal aorta: No aneurysm visualized. Other findings: None. IMPRESSION: Cholelithiasis without sonographic evidence of cholecystitis. Increased hepatic echogenicity,  likely fatty infiltration. Electronically Signed   By: Ellery Plunk M.D.   On: 05/17/2015 06:01   Ct Abdomen Pelvis W Contrast  05/16/2015  CLINICAL DATA:  Right upper quadrant abdominal pain, hematemesis, nausea and vomiting. Onset today. EXAM: CT ABDOMEN AND PELVIS WITH CONTRAST TECHNIQUE: Multidetector CT imaging of the abdomen and pelvis was performed using the standard protocol following bolus administration of intravenous contrast. CONTRAST:  25mL OMNIPAQUE IOHEXOL 300 MG/ML SOLN, OMNIPAQUE IOHEXOL 300 MG/ML SOLN COMPARISON:  03/30/2015 FINDINGS: Lower chest: Multiple pulmonary nodules, the largest measuring 4.7 mm in the posterior right lower lobe based. This has been present over multiple prior studies dating back to at least 09/18/2008. Hepatobiliary: There are normal appearances of the liver, gallbladder and bile ducts. Pancreas: Normal Spleen: Normal Adrenals/Urinary Tract: The adrenals and kidneys are normal in appearance. There is no urinary calculus evident. There is no hydronephrosis or ureteral dilatation. Collecting systems and ureters appear unremarkable. Stomach/Bowel: There is a small hiatal hernia. The appendix is normal. Small bowel and colon are unremarkable. Vascular/Lymphatic: The abdominal aorta is normal in caliber. There is no atherosclerotic calcification. There is no adenopathy in the abdomen or pelvis. Other: No acute inflammatory changes are evident in the abdomen or pelvis. There is no ascites. Musculoskeletal: Small fat containing umbilical hernia. No significant skeletal lesions. IMPRESSION: No acute findings are evident in the abdomen or pelvis. Stable pulmonary nodules in the lung bases. Small hiatal hernia. Small fat containing umbilical hernia. Electronically Signed   By: Ellery Plunk M.D.   On: 05/16/2015 22:36   Nm Hepato W/eject Fract  05/22/2015  CLINICAL DATA:  Intermittent right upper quadrant pain. EXAM: NUCLEAR MEDICINE HEPATOBILIARY IMAGING WITH  GALLBLADDER EF TECHNIQUE: Sequential images of the abdomen were obtained out to 60 minutes following intravenous administration of radiopharmaceutical. After slow intravenous infusion of 2.0 micrograms Cholecystokinin, gallbladder ejection fraction was determined. RADIOPHARMACEUTICALS:  5.4 mCi Tc-36m Choletec IV COMPARISON:  05/17/1968 as well as ultrasound 05/17/2015 and CT 05/16/2015 FINDINGS: Exam demonstrates normal uniform uptake of radiotracer by the liver. Gallbladder filling is present at 10-15 minutes. Biliary to bowel transit is present less than 1 hour. At 60 min, normal ejection fraction of 74% is calculated (Normal greater than 40%). IMPRESSION: Normal hepatobiliary scan without evidence of acute cholecystitis. Normal gallbladder ejection fraction of 74%. Electronically Signed   By: Elberta Fortis M.D.   On: 05/22/2015 14:49    Microbiology: Recent Results (from the past 240 hour(s))  Culture, Urine     Status: None   Collection Time: 05/19/15  2:44 PM  Result Value Ref Range Status   Specimen Description URINE, CLEAN CATCH  Final  Special Requests NONE  Final   Culture   Final    NO GROWTH 1 DAY Performed at [facility]    Report Status 05/20/2015 FINAL  Final     Labs: Basic Metabolic Panel:  Recent Labs Lab 05/16/15 1928 05/17/15 0523 05/18/15 0400 05/19/15 0650 05/20/15 0545  NA 138 138 139 140 140  K 4.7 4.3 4.2 4.3 4.2  CL 101 100* 104 104 104  CO2 GLUCOSE 110* 104* 134* 150* 140*  BUN CREATININE 0.97 0.89 0.86 0.98 1.01  CALCIUM 9.2 8.8* 8.5* 9.0 9.1   Liver Function Tests:  Recent Labs Lab 05/16/15 1928 05/17/15 0523 05/18/15 0400 05/19/15 0650 05/20/15 0545  AST 98* 88* 57* 58* 54*  ALT 138* 121* 92* 90* 84*  ALKPHOS 72 64 60 59 62  BILITOT 0.9 1.0 0.8 0.7 0.4  PROT 8.0 7.2 7.0 7.5 7.1  ALBUMIN 3.9 3.4* 3.2* 3.5 3.3*    Recent Labs Lab 05/16/15 1932  LIPASE 25   No results for input(s): AMMONIA in  the last 168 hours. CBC:  Recent Labs Lab 05/16/15 1928 05/17/15 0523 05/18/15 0400 05/19/15 0650 05/20/15 0545  WBC 9.7 8.0 6.9 8.2 8.9  NEUTROABS  --   --  3.8 5.2  --   HGB 15.6 14.3 13.7 14.3 14.3  HCT 47.9 42.2 42.2 43.8 41.8  MCV 96.6 92.5 96.1 95.4 91.3  PLT 224 191 157 195 186   Cardiac Enzymes: No results for input(s): CKTOTAL, CKMB, CKMBINDEX, TROPONINI in the last 168 hours. BNP: BNP (last 3 results)  Recent Labs  04/01/15 0030  BNP 16.0    ProBNP (last 3 results) No results for input(s): PROBNP in the last 8760 hours.  CBG:  Recent Labs Lab 05/20/15 2127 05/21/15 0730 05/21/15 2210 05/22/15 0804 05/22/15 1133  GLUCAP 90 126* 141* 139* 112*       Signed:  JESSICA U VANN DO.  [facility] 05/22/2015, 4:36 PM

## 2015-05-22 NOTE — Progress Notes (Signed)
Patient ID: Bryan Gallegos L Ambers, male   DOB: 07/02/1975, 40 y.o.   MRN: [id number]   EGD: normal CCK HIDA: normal  Extensive GI evaluation since admit unrevealing Pain either functional or related to drug seeking/secondary gain/malingering Ok to discharge for hospital from Gi standpoint Pt has been discharged form [facility], and will not be followed by [facility] or [facility] as an outpt. GI signing off

## 2015-05-22 NOTE — Progress Notes (Signed)
Patient is alert and oriented, vital signs are stable, discharge instructions reviewed with patient, prescription called in to pharmacy by MD Vannfor zofran Stanford BreedBracey, Ronesha Heenan N RN 4:50 PM 05-22-2015

## 2015-05-22 NOTE — Op Note (Signed)
[facility] Patient Name: Bryan Gallegos Procedure Date: 05/21/2015 MRN: [id number] Attending MD: Meryl Dare , MD Date of Birth: [birth year] CSN:  Age: 40 Admit Type: Outpatient Procedure:                Upper GI endoscopy Indications:              Persistent vomiting of unknown cause, Epigastric                            abdominal pain, Abdominal pain in the right upper                            quadrant Providers:                Venita Lick. Russella Dar, MD, Waynard Edwards, RN, Darletta Moll, Technician Referring MD:             [facility] Medicines:                Monitored Anesthesia Care Complications:            No immediate complications. Estimated Blood Loss:     Estimated blood loss: none. Procedure:                Pre-Anesthesia Assessment:                           - Prior to the procedure, a History and Physical                            was performed, and patient medications and                            allergies were reviewed. The patient's tolerance of                            previous anesthesia was also reviewed. The risks                            and benefits of the procedure and the sedation                            options and risks were discussed with the patient.                            All questions were answered, and informed consent                            was obtained. Prior Anticoagulants: The patient has                            taken no previous anticoagulant or antiplatelet                            agents.  ASA Grade Assessment: III - A patient with                            severe systemic disease. After reviewing the risks                            and benefits, the patient was deemed in                            satisfactory condition to undergo the procedure.                           After obtaining informed consent, the endoscope was                            passed under direct  vision. Throughout the                            procedure, the patient's blood pressure, pulse, and                            oxygen saturations were monitored continuously. The                            [id number] ([id number]) scope was introduced through the                            mouth, and advanced to the second part of duodenum.                            The upper GI endoscopy was accomplished without                            difficulty. The patient tolerated the procedure                            fairly well. Scope In: Scope Out: Findings:      The esophagus was normal.      The stomach was normal.      The examined duodenum was normal.      The cardia and gastric fundus were normal on retroflexion. Impression:               - Normal EGD.                           - No specimens collected. Moderate Sedation:      N/A- Per Anesthesia Care Recommendation:           - Return patient to hospital ward for ongoing care.                           - Resume previous diet.                           - Continue present medications. Procedure Code(s):        ---  Professional ---                           [phone], Esophagogastroduodenoscopy, flexible,                            transoral; diagnostic, including collection of                            specimen(s) by brushing or washing, when performed                            (separate procedure) Diagnosis Code(s):        --- Professional ---                           R11.10, Vomiting, unspecified                           R10.13, Epigastric pain                           R10.11, Right upper quadrant pain CPT copyright 2016 American Medical Association. All rights reserved. The codes documented in this report are preliminary and upon coder review may  be revised to meet current compliance requirements. Venita LickMalcolm T. Russella DarStark, MD Meryl DareMalcolm T Stark, MD 05/21/2015 12:39:29 PM This report has been signed electronically. Number of Addenda: 0

## 2015-06-02 IMAGING — CT CT HEAD W/O CM
1 series · 16 of 30 positions shown, 20 images · non-contrast
Comparison: CT scan of the brain August 06, 2013

CLINICAL DATA: Left-sided weakness and numbness with history of
diabetes and valvular heart disease

EXAM:
CT HEAD WITHOUT CONTRAST
TECHNIQUE: Contiguous axial images were obtained from the base of the skull
through the vertex without intravenous contrast.

[Series 2: head 5.0 h30s · axial · 0.49mm/px · z∈[-179,-24]mm · 16 of 35 slices shown, 20 images]
[im 2/35  brain]
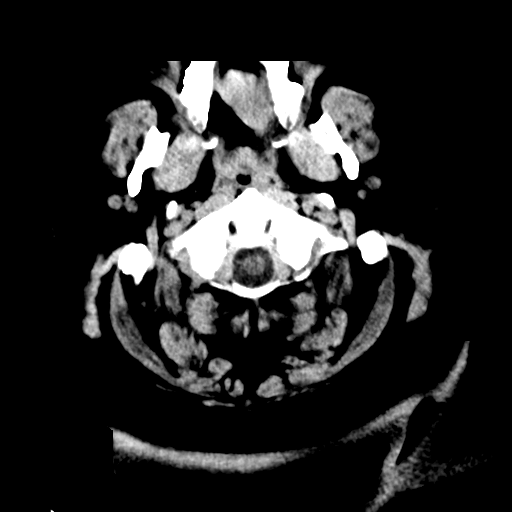
[im 2/35  bone]
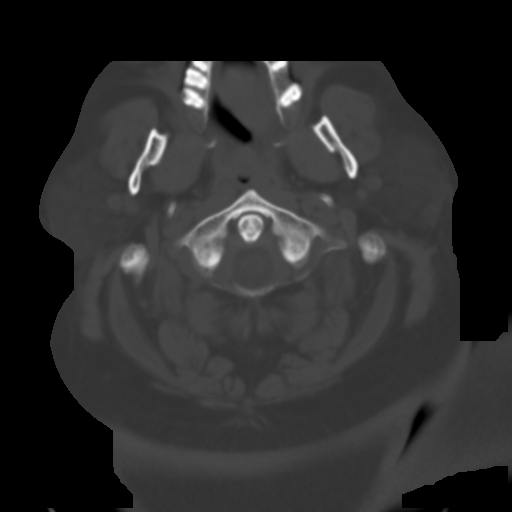
[im 4/35  brain]
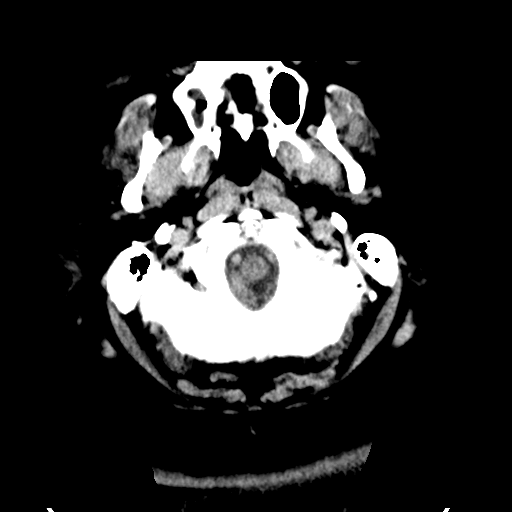
[im 6/35  brain]
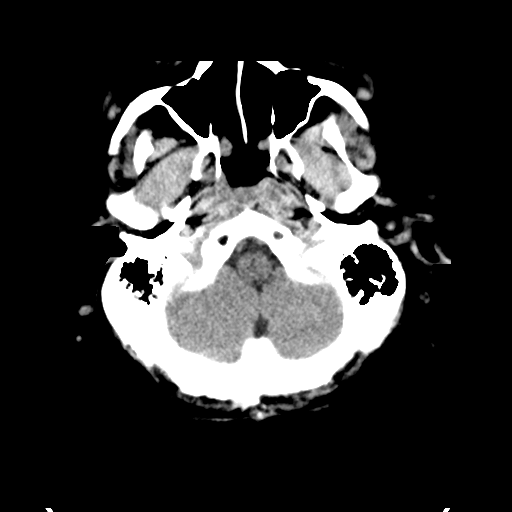
[im 9/35  brain]
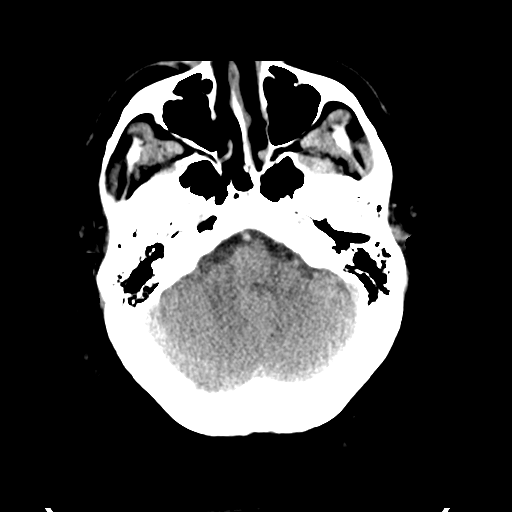
[im 10/35  brain]
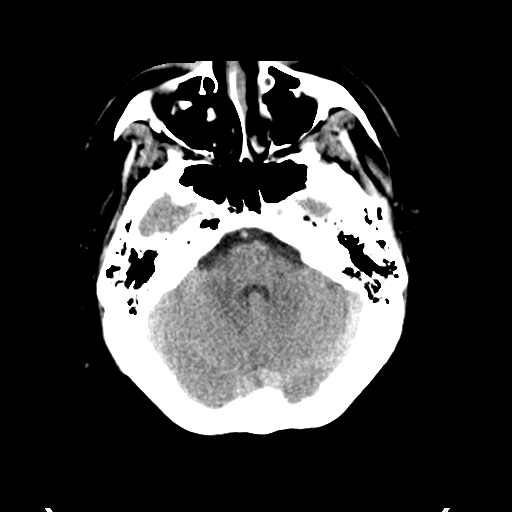
[im 10/35  bone]
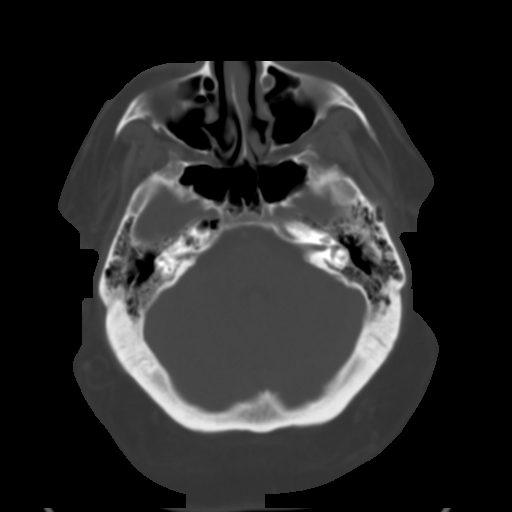
[im 12/35  brain]
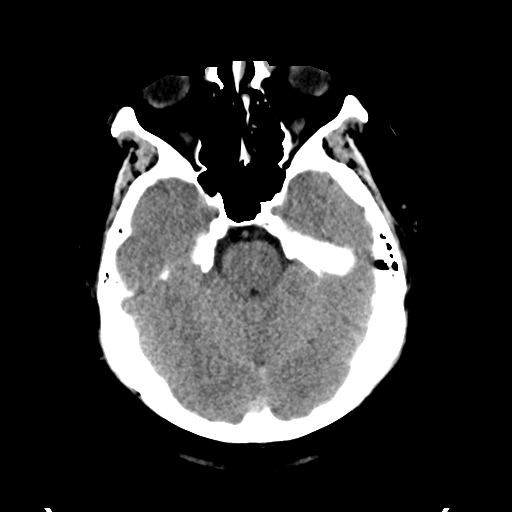
[im 15/35  brain]
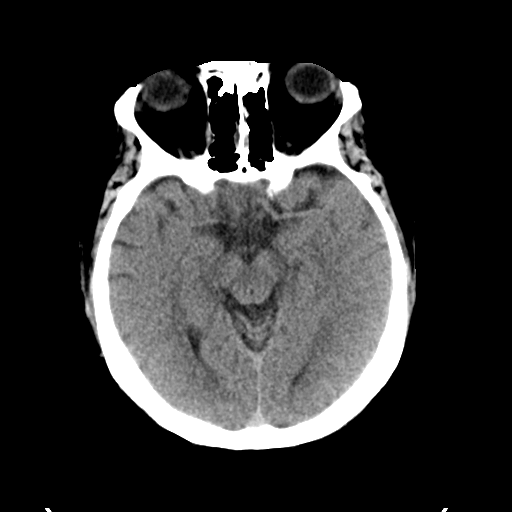
[im 17/35  brain]
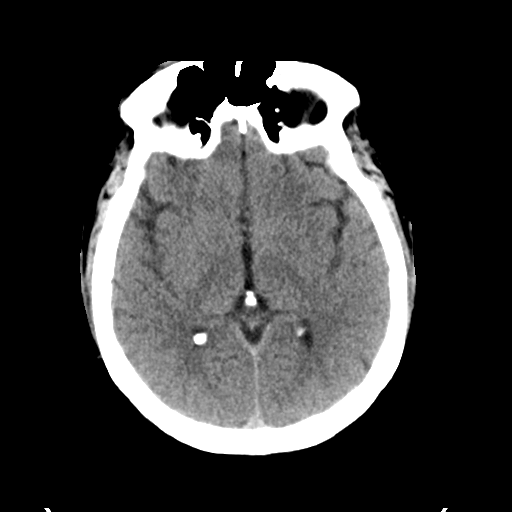
[im 18/35  brain]
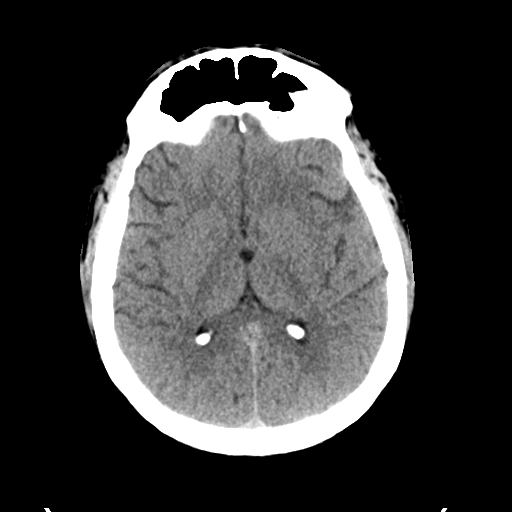
[im 18/35  bone]
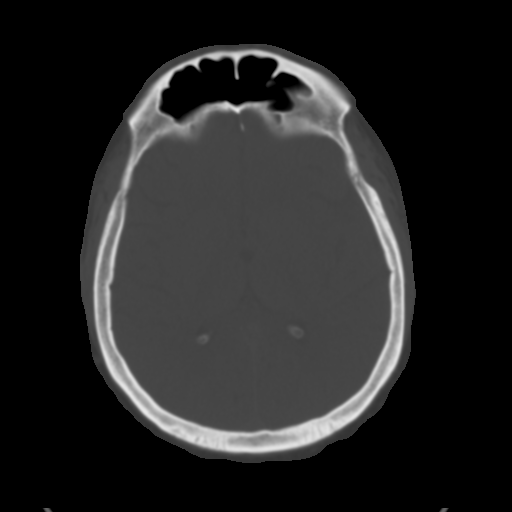
[im 20/35  brain]
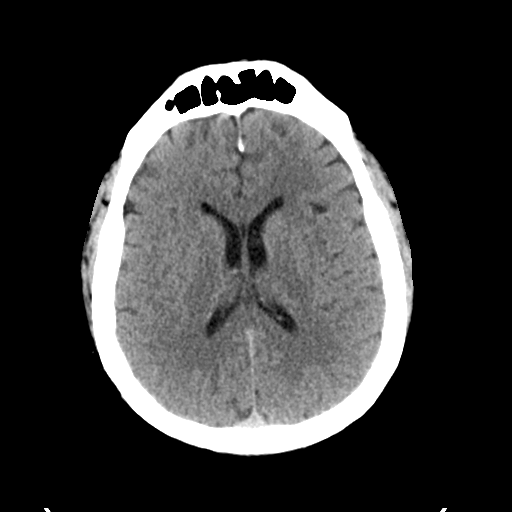
[im 23/35  brain]
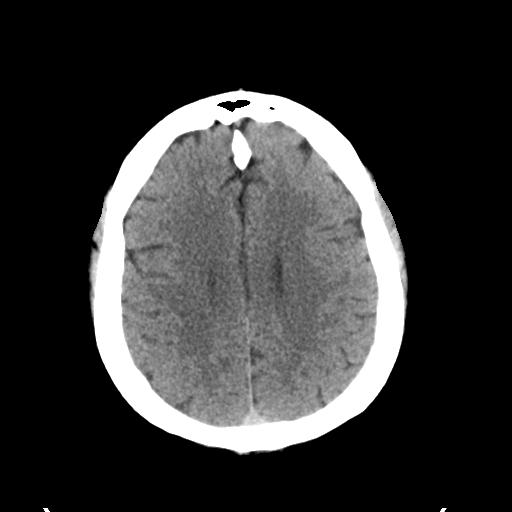
[im 25/35  brain]
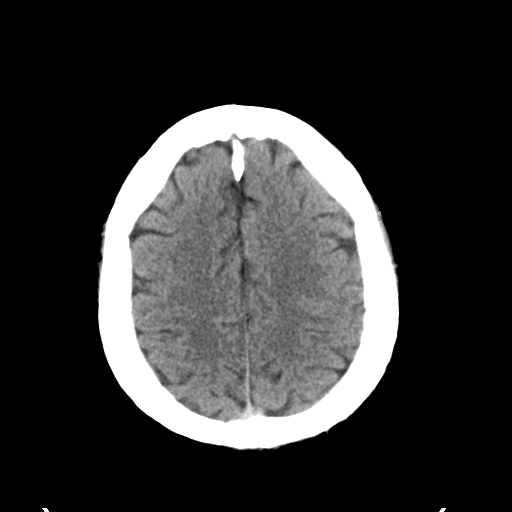
[im 26/35  brain]
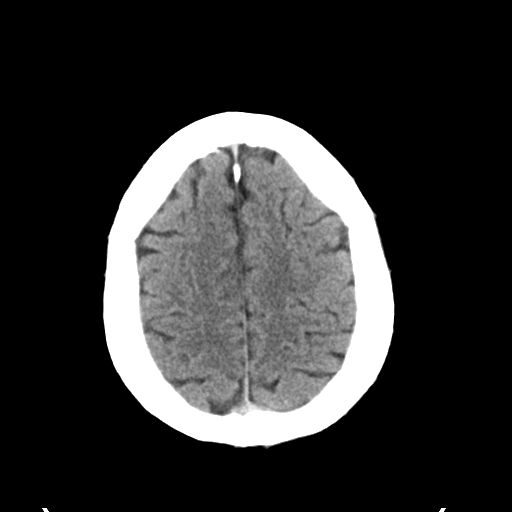
[im 26/35  bone]
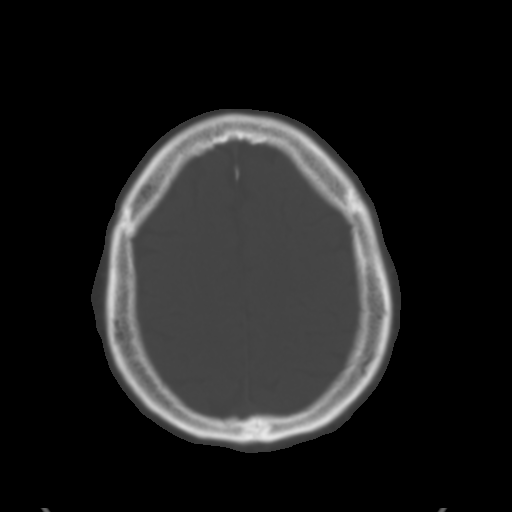
[im 29/35  brain]
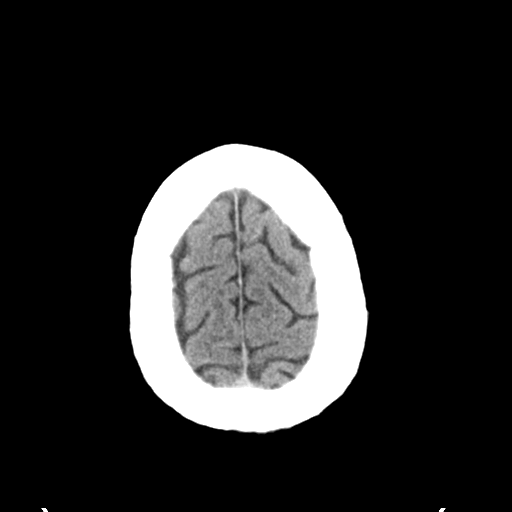
[im 31/35  brain]
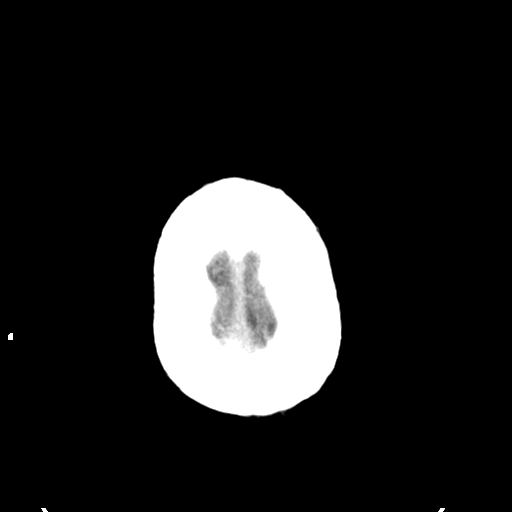
[im 33/35  brain]
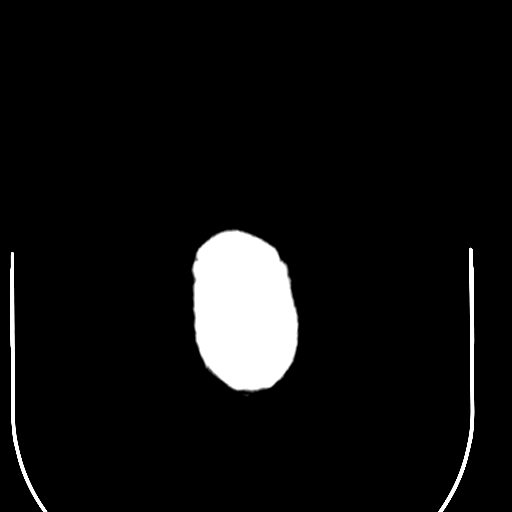

[16 of 30 positions shown; findings below may reference images not displayed]

FINDINGS: The ventricles are normal in size and position. There is no
intracranial hemorrhage, mass effect, nor acute ischemic change. The
cerebellum and brainstem are normal.

There is mucoperiosteal thickening in the right maxillary sinus. The
remainder the visualized paranasal sinuses and mastoid air cells are
clear. There is no skull fracture or lytic or blastic skull lesion.
IMPRESSION: There is no acute intracranial abnormality. There is chronic
opacification of a portion of the right maxillary sinus.

## 2015-06-02 IMAGING — CT CT CTA ABD/PEL W/CM AND/OR W/O CM
2 of 10 series · 14 of 46 positions shown, 16 images · IV contrast (omnipaque)
Comparison: No prior CT chest.

CLINICAL DATA: Right sided abdominal pain described as a tearing
pain, radiating to the back. Left-sided weakness and numbness.
Evaluate for possible dissection. Current history of diabetes,
hypertension, asthma, and unspecified heart valve disorder.

EXAM:
CT ANGIOGRAPHY CHEST, ABDOMEN AND PELVIS
TECHNIQUE: Multidetector CT imaging through the chest, abdomen and pelvis was
performed using the standard protocol before and during bolus
administration of intravenous contrast. Multiplanar reconstructed
images and MIPs were obtained and reviewed to evaluate the vascular
anatomy.
CONTRAST:  100mL OMNIPAQUE IOHEXOL 350 MG/ML IV.

[Series 5: dissection 2.0 i30f 1 · axial · 0.86mm/px · z∈[-950,-332]mm · 11 of 349 slices shown, 13 images]
[im 20/349  soft-tissue]
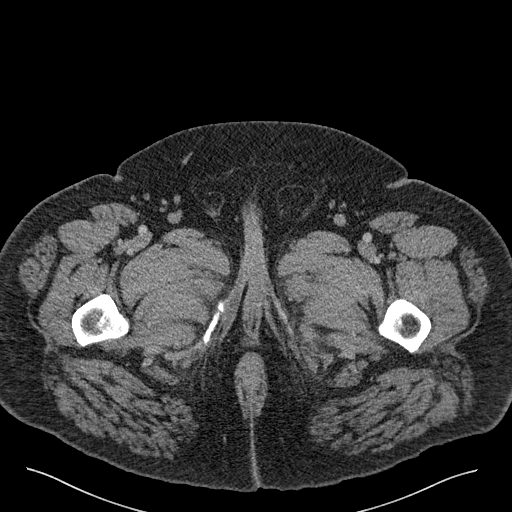
[im 20/349  bone]
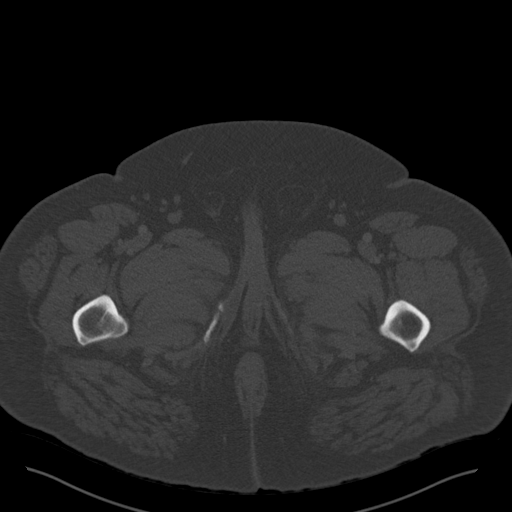
[im 59/349  soft-tissue]
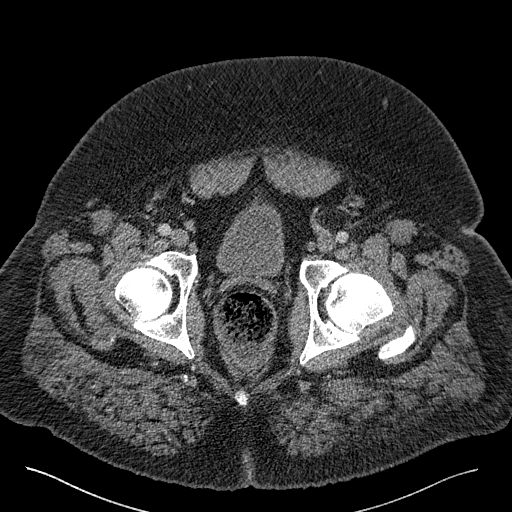
[im 78/349  soft-tissue]
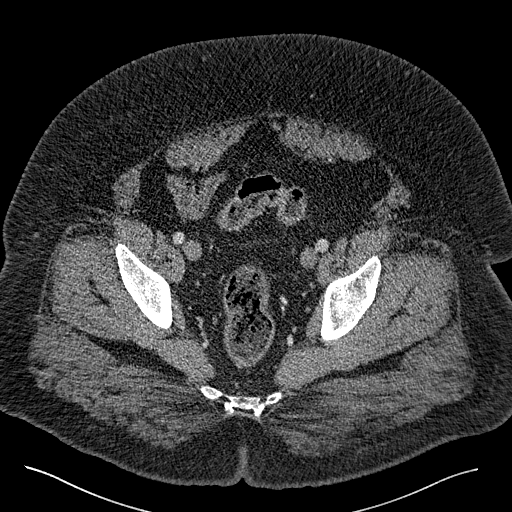
[im 117/349  soft-tissue]
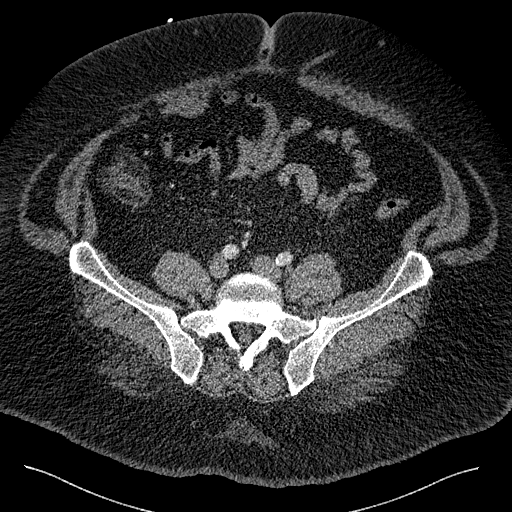
[im 136/349  soft-tissue]
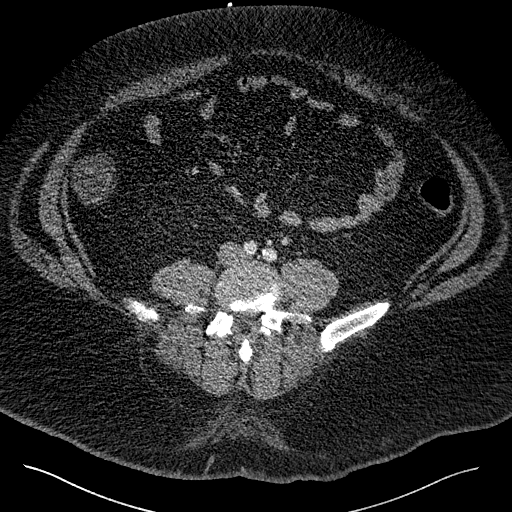
[im 175/349  soft-tissue]
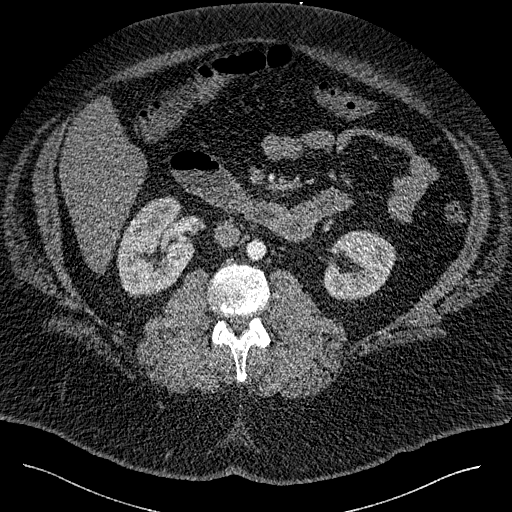
[im 213/349  soft-tissue]
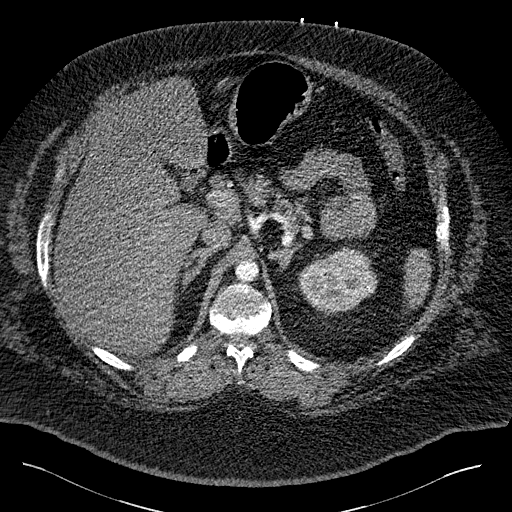
[im 233/349  soft-tissue]
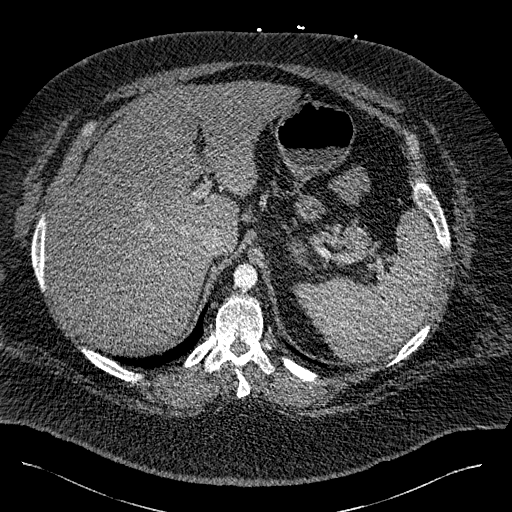
[im 271/349  soft-tissue]
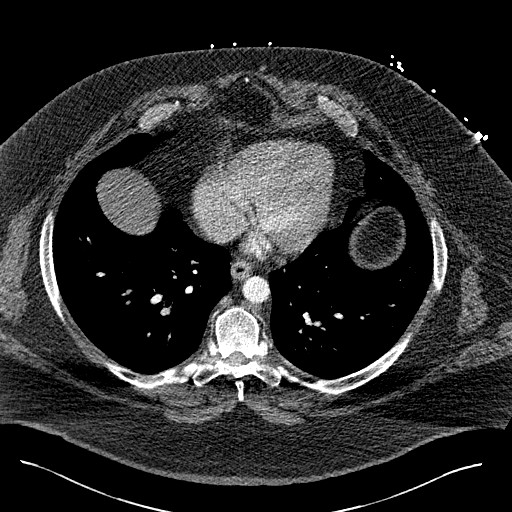
[im 271/349  bone]
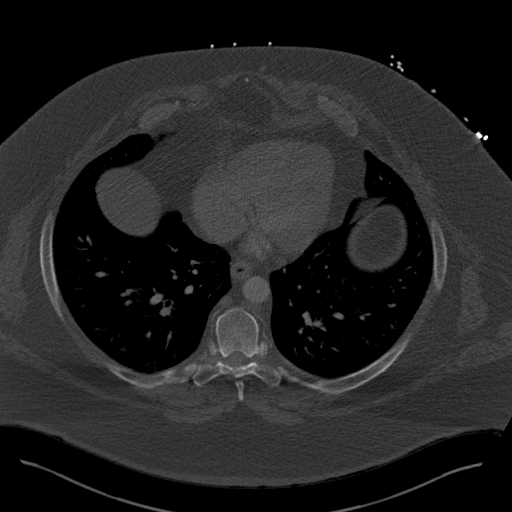
[im 291/349  soft-tissue]
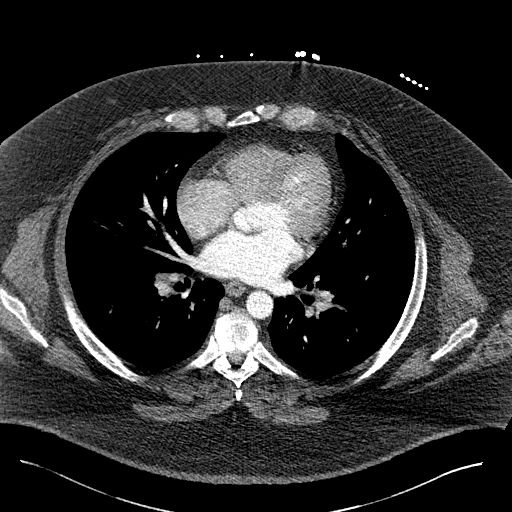
[im 329/349  soft-tissue]
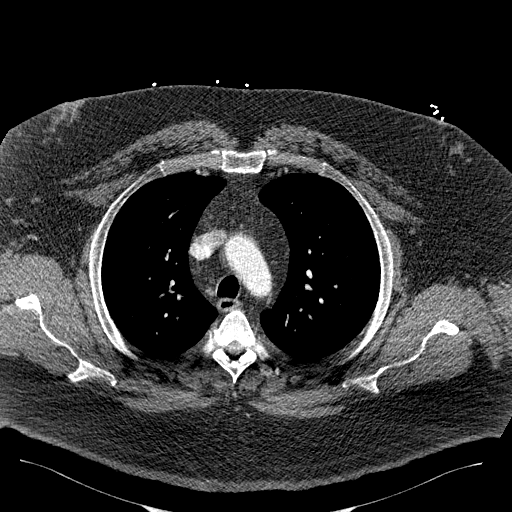

[Series 8: coronal mpr · coronal · 1.02mm/px · 3 of 190 slices shown]
[im 48/190  soft-tissue]
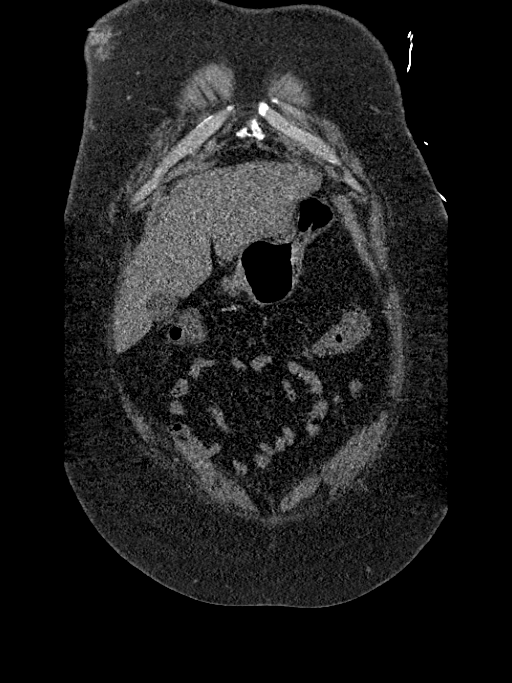
[im 95/190  soft-tissue]
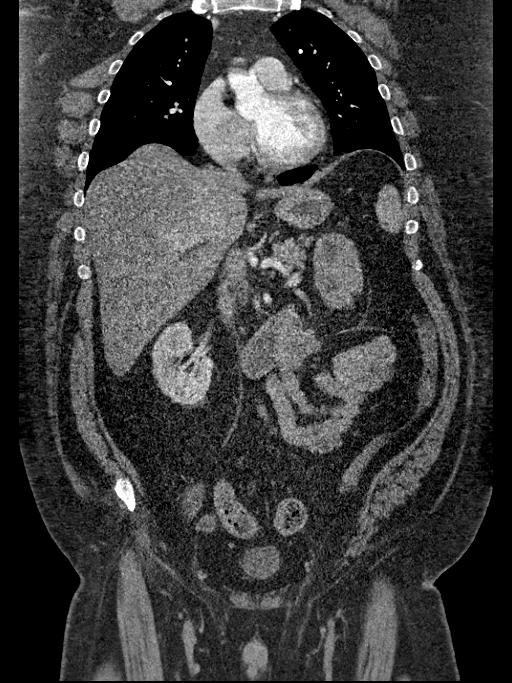
[im 142/190  soft-tissue]
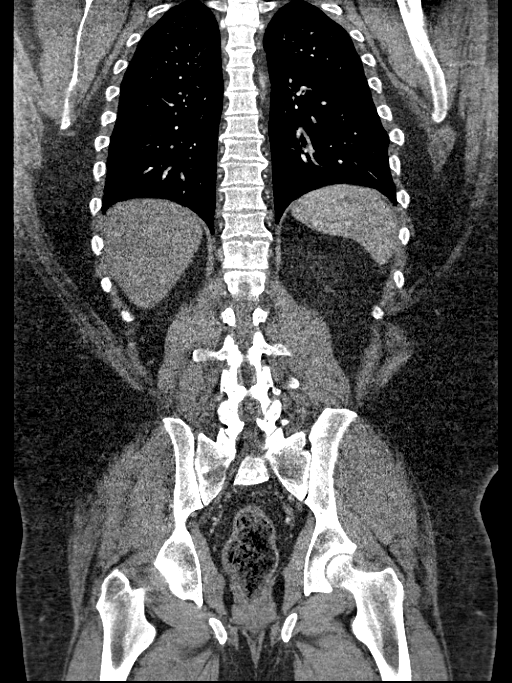

[14 of 46 positions shown; findings below may reference images not displayed]

Multiple CTs of the abdomen and
pelvis dating back to 09/18/2008, most recently 04/26/2013.
FINDINGS: CTA CHEST FINDINGS

Abundant image noise due to body habitus. Unenhanced images
demonstrate no mural hematoma in the thoracic aorta. No calcified
plaque involving the thoracic aorta or proximal great vessels. No
visible coronary calcification.

Enhanced images demonstrate no evidence of thoracic aortic aneurysm
or dissection. No visible atherosclerotic plaque. Proximal great
vessels widely patent.

Heart size normal. Pulmonary artery is not well opacified, but no
large central emboli. No pericardial effusion.

Approximate 8 mm circumscribed nodule in the posterior right lower
lobe (series 7, image 69), stable dating back to 6100. Peripheral
polygonal approximate 6 mm nodules in the anterior right lower lobe
adjacent to the minor fissure (image 69) and in the anterior right
upper lobe (image 51), also unchanged. No new or enlarging pulmonary
nodules. Lungs clear without localized airspace consolidation or
interstitial disease. Central airways patent with mild bronchial
wall thickening. No pleural effusions.

Scattered normal size mediastinal, hilar, and axillary lymph nodes.
No significant lymphadenopathy. Visualized thyroid gland
unremarkable.

Bone window images unremarkable.

Review of the MIP images confirms the above findings.

CTA ABDOMEN AND PELVIS FINDINGS

Abundant image noise due to body habitus. No evidence of dissection
of the abdominal aorta or the iliofemoral arteries. Solitary
calcified plaque involving the left common femoral artery. No
visible atherosclerotic plaque elsewhere. Single right renal artery
and 2 left renal arteries, widely patent. Widely patent celiac
artery, SMA and IMA.

Diffuse hepatic steatosis without focal hepatic parenchymal
abnormality. Normal appearing spleen, pancreas, right adrenal gland,
and kidneys. Approximate 1.6 x 1.5 x 1.8 cm low-attenuation nodule
involving the left adrenal gland. Gallbladder unremarkable by CT. No
biliary ductal dilation. No significant lymphadenopathy.

Normal appearing stomach and small bowel. Moderate stool burden in
the rectum and sigmoid colon. Entire colon normal in appearance.
Lipoma involving the ileocecal valve. Normal appearing decompressed
appendix in the right upper pelvis. No ascites.

Urinary bladder decompressed and unremarkable. Prostate gland and
seminal vesicles normal for age. Small left inguinal hernia
containing fat.

Bone window images unremarkable.

Review of the MIP images confirms the above findings.
IMPRESSION: 1. No evidence of thoracic or abdominal aortic aneurysm or
dissection.
2. Minimal atherosclerosis involving the left common femoral artery.
No visible atherosclerotic disease elsewhere.
3. Stable right lung nodules dating back to 6100, consistent with
benign granulomas and likely subpleural lymph nodes.
4.  No acute cardiopulmonary disease.
5. Hepatic steatosis.
6. No acute abnormalities involving the abdomen or pelvis.
7. Small left inguinal hernia containing fat.
8. Approximate 1.8 cm left adrenal nodule, statistically a benign
adenoma.

## 2015-06-03 IMAGING — MR MR HEAD W/O CM
9 of 10 series · 31 of 48 positions shown · non-contrast
Comparison: 09/02/2013 CT.  No comparison MR.

CLINICAL DATA: Left sided weakness and numbness. Hypertension.
Diabetes. Hyperlipidemia. Morbid obesity.

EXAM:
MRI HEAD WITHOUT CONTRAST
MRA HEAD WITHOUT CONTRAST
TECHNIQUE: Multiplanar, multiecho pulse sequences of the brain and surrounding
structures were obtained without intravenous contrast. Angiographic
images of the head were obtained using MRA technique without
contrast.

[Series 4: DWI · axial · 5.0mm · 1.02mm/px · z∈[-10,+140]mm · 5 of 62 slices shown (1 of 4)]
[im 1/62]
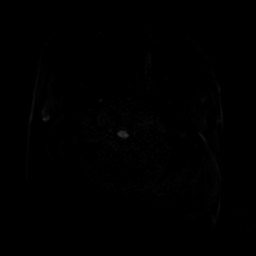
[im 16/62]
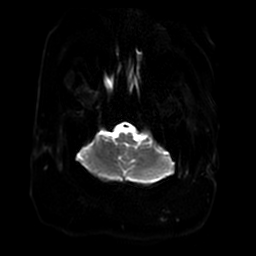
[im 31/62]
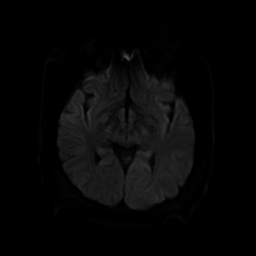
[im 46/62]
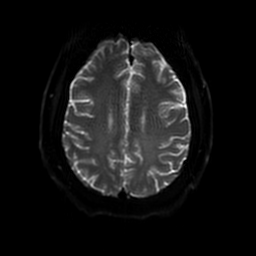
[im 62/62]
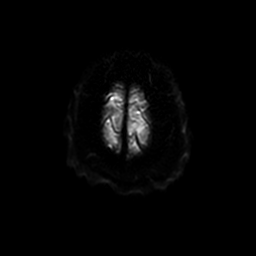

[Series 6: T2 · axial · 5.0mm · 0.43mm/px · z∈[-8,+130]mm · 3 of 24 slices shown (1 of 2)]
[im 1/24]
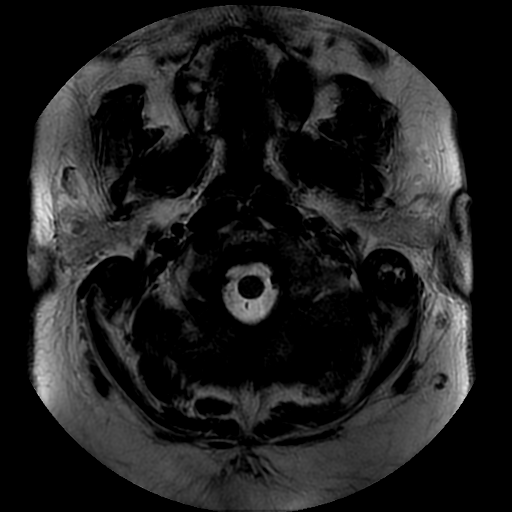
[im 12/24]
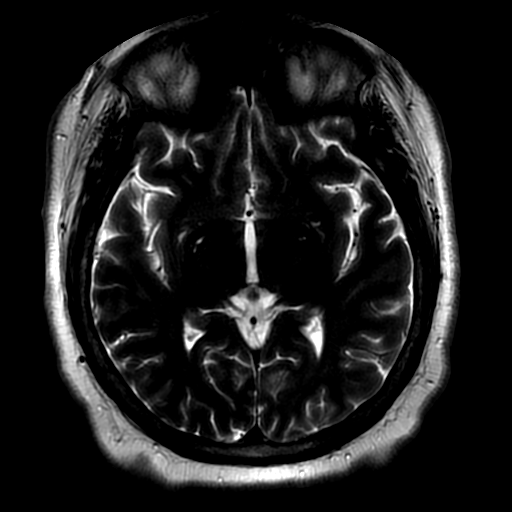
[im 24/24]
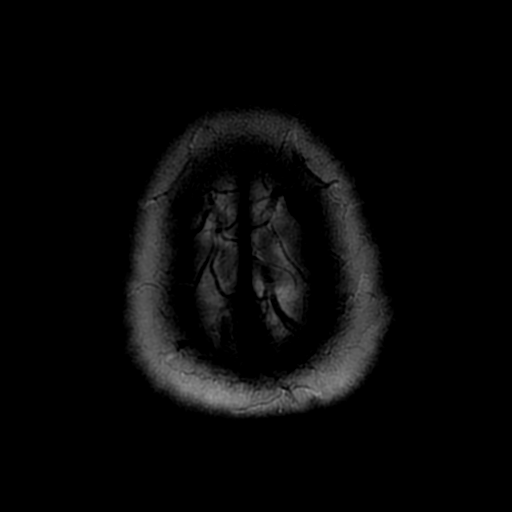

[Series 7: FLAIR · axial · 5.0mm · 0.43mm/px · z∈[-8,+130]mm · 3 of 24 slices shown (1 of 2)]
[im 1/24]
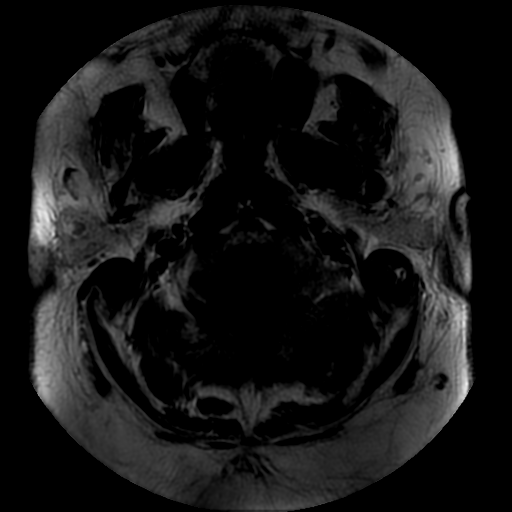
[im 12/24]
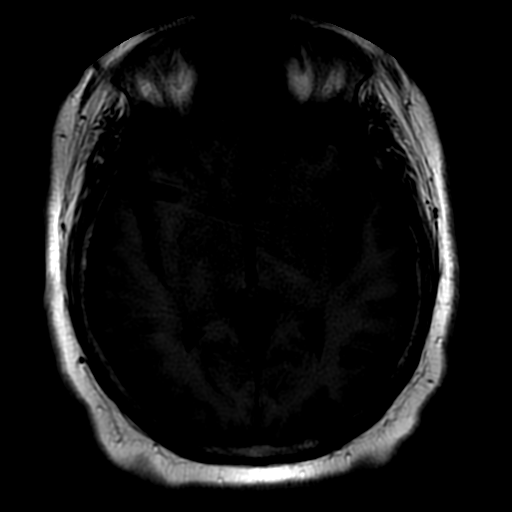
[im 24/24]
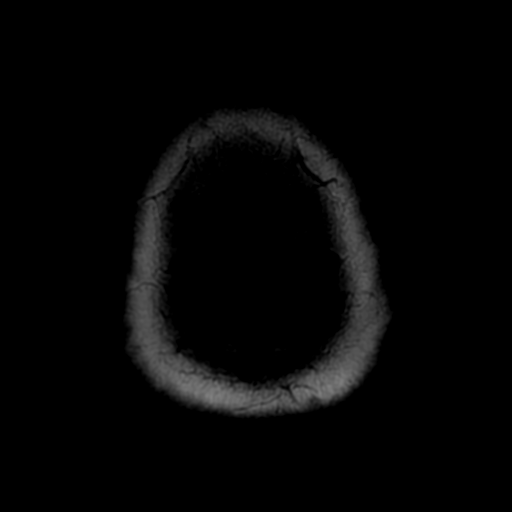

[Series 8: FLAIR · axial · 5.0mm · 0.43mm/px · z∈[-8,+130]mm · 3 of 24 slices shown (2 of 2)]
[im 1/24]
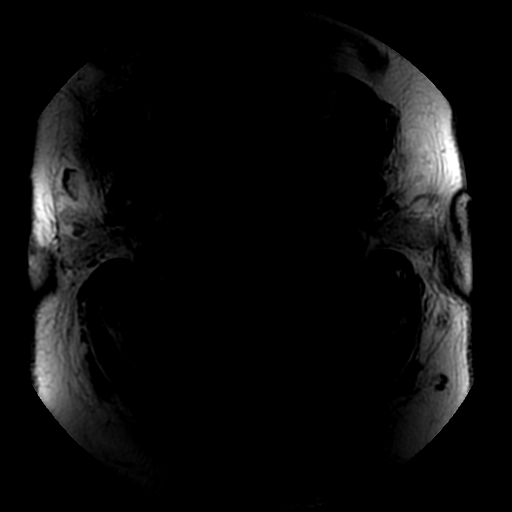
[im 12/24]
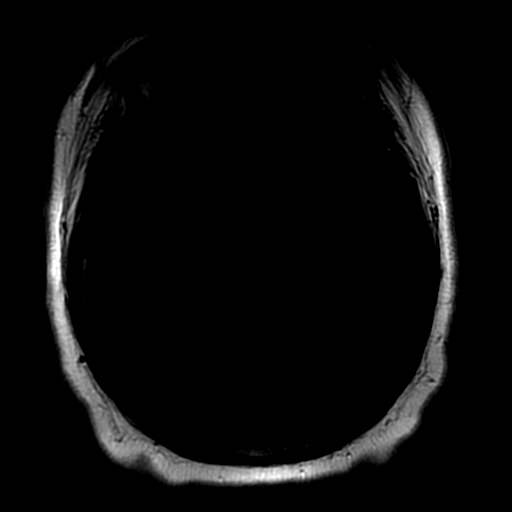
[im 24/24]
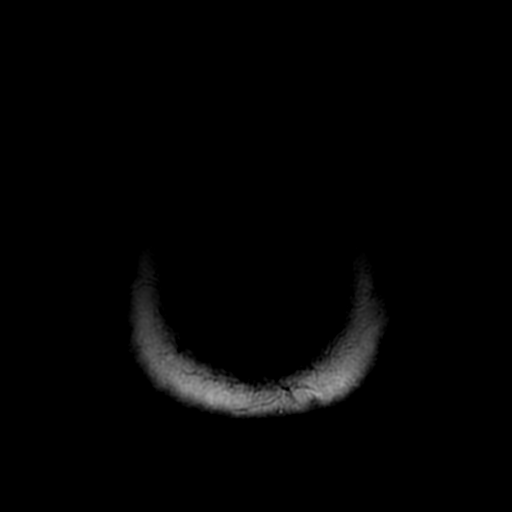

[Series 9: GRE · axial · 5.0mm · 0.43mm/px · 1 of 24 slices shown]
[im 1/24]
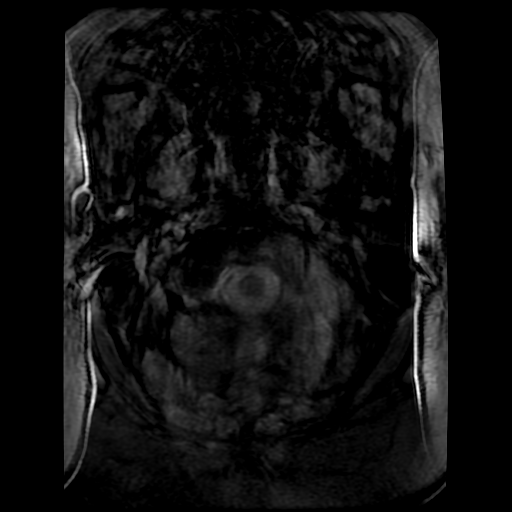

[Series 10: DWI · coronal · 5.0mm · 1.02mm/px · 7 of 64 slices shown (2 of 4)]
[im 1/64]
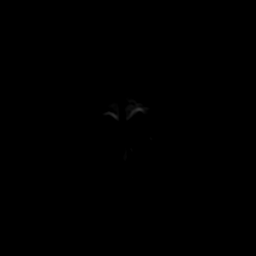
[im 11/64]
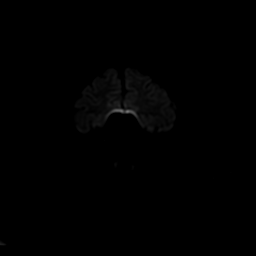
[im 22/64]
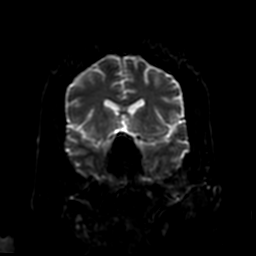
[im 32/64]
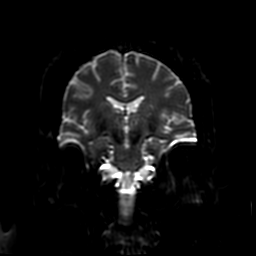
[im 43/64]
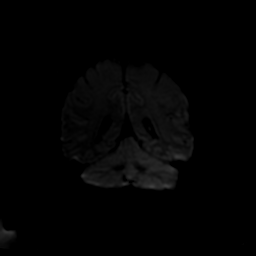
[im 53/64]
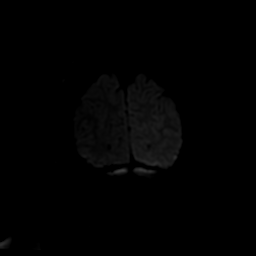
[im 64/64]
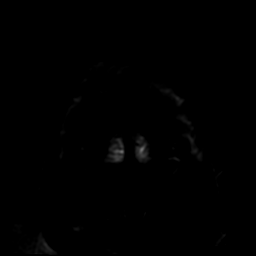

[Series 11: T2 · coronal · 5.0mm · 0.47mm/px · 3 of 27 slices shown (2 of 2)]
[im 1/27]
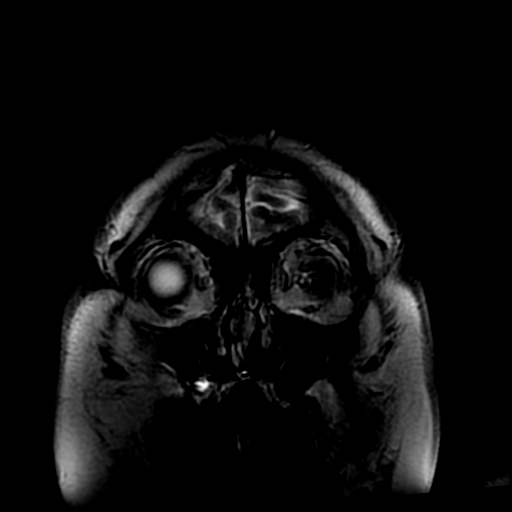
[im 14/27]
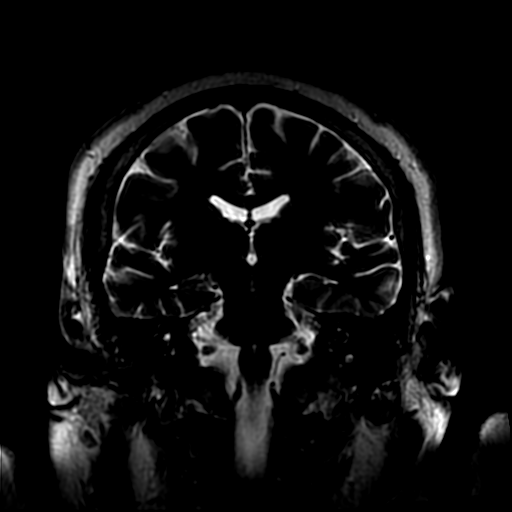
[im 27/27]
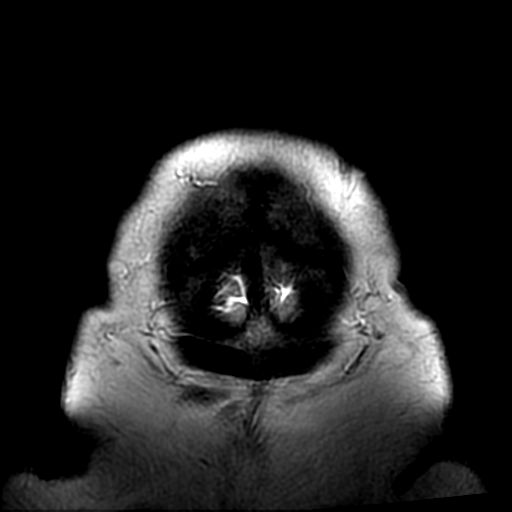

[Series 400: DWI · axial · 5.0mm · 1.02mm/px · z∈[-10,+140]mm · 3 of 29 slices shown (3 of 4)]
[im 1/29]
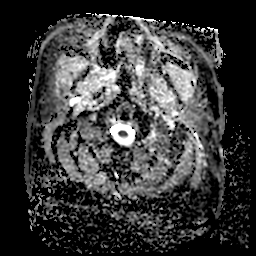
[im 15/29]
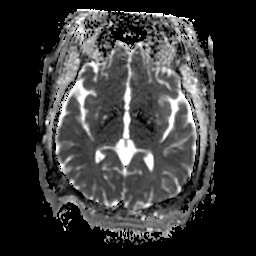
[im 29/29]
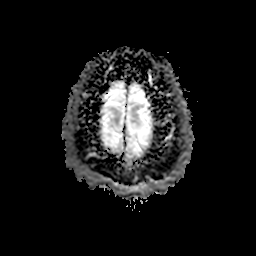

[Series 1000: DWI · coronal · 5.0mm · 1.02mm/px · 3 of 32 slices shown (4 of 4)]
[im 1/32]
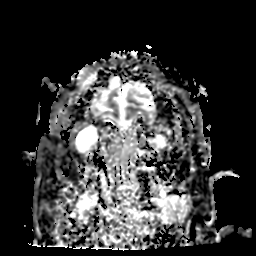
[im 16/32]
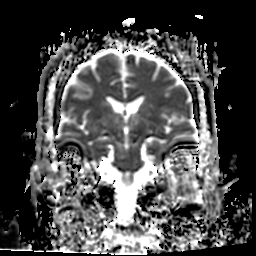
[im 32/32]
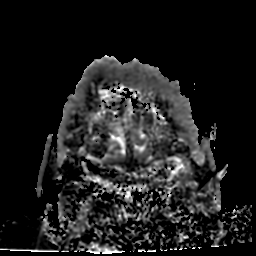

[31 of 48 positions shown; findings below may reference images not displayed]

FINDINGS: MRI HEAD FINDINGS

Some of the images are motion degraded. Fast technique imaging had
to be utilized.

No acute infarct.

No intracranial hemorrhage.

No intracranial mass lesion noted on this unenhanced exam.

No hydrocephalus.

Exophthalmos.

Partial opacification mastoid air cells without mass noted causing
eustachian tube dysfunction. Opacification aerated aspect upper
right pterygoid plate. Minimal mucosal thickening right maxillary
sinus.

MRA HEAD FINDINGS

Examination is motion degraded. This limits evaluation for grading
stenosis or detecting aneurysm.

All that can be stated with certainty on the present examination is
that there is flow within portions of the vertebral arteries,
basilar artery, internal carotid arteries, middle cerebral arteries,
anterior cerebral arteries and posterior cerebral arteries.
IMPRESSION: MRI HEAD :

Some of the images are motion degraded. Fast technique imaging had
to be utilized.

No acute infarct.

Exophthalmos.

Partial opacification mastoid air cells without mass noted causing
eustachian tube dysfunction. Opacification aerated aspect upper
right pterygoid plate. Minimal mucosal thickening right maxillary
sinus.

MRA HEAD :

Examination is motion degraded. This limits evaluation for grading
stenosis or detecting aneurysm.

All that can be stated with certainty on the present examination is
that there is flow within portions of the vertebral arteries,
basilar artery, internal carotid arteries, middle cerebral arteries,
anterior cerebral arteries and posterior cerebral arteries.

## 2015-07-15 ENCOUNTER — Emergency Department (HOSPITAL_BASED_OUTPATIENT_CLINIC_OR_DEPARTMENT_OTHER): Payer: Medicare Other

## 2015-07-15 ENCOUNTER — Encounter (HOSPITAL_BASED_OUTPATIENT_CLINIC_OR_DEPARTMENT_OTHER): Payer: Self-pay | Admitting: Emergency Medicine

## 2015-07-15 ENCOUNTER — Emergency Department (HOSPITAL_BASED_OUTPATIENT_CLINIC_OR_DEPARTMENT_OTHER)
Admission: EM | Admit: 2015-07-15 | Discharge: 2015-07-15 | Disposition: A | Discharge status: Home / Self Care | Payer: Medicare Other | Attending: Emergency Medicine | Admitting: Emergency Medicine

## 2015-07-15 DIAGNOSIS — F17210 Nicotine dependence, cigarettes, uncomplicated: Secondary | ICD-10-CM | POA: Diagnosis not present

## 2015-07-15 DIAGNOSIS — F319 Bipolar disorder, unspecified: Secondary | ICD-10-CM | POA: Diagnosis not present

## 2015-07-15 DIAGNOSIS — S8392XA Sprain of unspecified site of left knee, initial encounter: Secondary | ICD-10-CM | POA: Diagnosis not present

## 2015-07-15 DIAGNOSIS — Z6841 Body Mass Index (BMI) 40.0 and over, adult: Secondary | ICD-10-CM | POA: Insufficient documentation

## 2015-07-15 DIAGNOSIS — Y999 Unspecified external cause status: Secondary | ICD-10-CM | POA: Insufficient documentation

## 2015-07-15 DIAGNOSIS — Y9289 Other specified places as the place of occurrence of the external cause: Secondary | ICD-10-CM | POA: Diagnosis not present

## 2015-07-15 DIAGNOSIS — J45909 Unspecified asthma, uncomplicated: Secondary | ICD-10-CM | POA: Insufficient documentation

## 2015-07-15 DIAGNOSIS — W19XXXA Unspecified fall, initial encounter: Secondary | ICD-10-CM

## 2015-07-15 DIAGNOSIS — S99912A Unspecified injury of left ankle, initial encounter: Secondary | ICD-10-CM | POA: Diagnosis present

## 2015-07-15 DIAGNOSIS — E119 Type 2 diabetes mellitus without complications: Secondary | ICD-10-CM | POA: Diagnosis not present

## 2015-07-15 DIAGNOSIS — E785 Hyperlipidemia, unspecified: Secondary | ICD-10-CM | POA: Diagnosis not present

## 2015-07-15 DIAGNOSIS — S93402A Sprain of unspecified ligament of left ankle, initial encounter: Secondary | ICD-10-CM | POA: Diagnosis not present

## 2015-07-15 DIAGNOSIS — Y9301 Activity, walking, marching and hiking: Secondary | ICD-10-CM | POA: Diagnosis not present

## 2015-07-15 DIAGNOSIS — I10 Essential (primary) hypertension: Secondary | ICD-10-CM | POA: Diagnosis not present

## 2015-07-15 DIAGNOSIS — W228XXA Striking against or struck by other objects, initial encounter: Secondary | ICD-10-CM | POA: Diagnosis not present

## 2015-07-15 DIAGNOSIS — S43402A Unspecified sprain of left shoulder joint, initial encounter: Secondary | ICD-10-CM | POA: Diagnosis not present

## 2015-07-15 LAB — CBG MONITORING, ED: GLUCOSE-CAPILLARY: 170 mg/dL — AB (ref 65–99)

## 2015-07-15 MED ORDER — OXYCODONE HCL 5 MG PO TABS
5.0000 mg | ORAL_TABLET | Freq: Once | ORAL | Status: AC
  Administered 2015-07-15: 5 mg via ORAL

## 2015-07-15 MED ORDER — OXYCODONE HCL 5 MG PO TABS: 5.0000 mg | ORAL_TABLET | ORAL | Status: DC | PRN

## 2015-07-15 MED ORDER — OXYCODONE HCL 5 MG PO TABS
ORAL_TABLET | ORAL | Status: AC
  Filled 2015-07-15: qty 1

## 2015-07-15 MED ORDER — HYDROCODONE-ACETAMINOPHEN 5-325 MG PO TABS: 1.0000 | ORAL_TABLET | Freq: Four times a day (QID) | ORAL | Status: DC | PRN

## 2015-07-15 MED ORDER — MORPHINE SULFATE (PF) 2 MG/ML IV SOLN: 2.0000 mg | Freq: Once | INTRAVENOUS | Status: DC

## 2015-07-15 NOTE — ED Provider Notes (Signed)
CSN: [id number]     Arrival date & time 07/15/15  1030 History   First MD Initiated Contact with Patient 07/15/15 1034     Chief Complaint  Patient presents with  . Leg Pain  . Shoulder Pain     (Consider location/radiation/quality/duration/timing/severity/associated sxs/prior Treatment) HPI Comments: Patient reports that earlier today, while in his friend's trailer, he was walking towards the door when he fell through the floor. His left leg fell through, injuring his knee and ankle, and his left shoulder hit the door. He has had significant pain since then, not able to bear weight on his left leg. He suffered some abrasions. He reports not using any medications to help with symptoms. No syncope, weakness, fatigue, chest pain, dyspnea, nausea or vomiting.   Past Medical History  Diagnosis Date  . Heart valve disorder     s/p echocardiogram  . Hypertension   . Asthma   . Diabetes mellitus   . Hyperlipidemia   . Anaphylactic reaction   . Bipolar disorder (HCC)   . Adult ADHD   . Morbidly obese (HCC)   . Transient cerebral ischemia     Unknown  . OSA (obstructive sleep apnea)   . Anxiety   . Complication of anesthesia     Per patient difficult intubation;  . Difficult intubation     Per patient   Past Surgical History  Procedure Laterality Date  . Carpal tunnel release    . Nose surgery    . Wisdom tooth extraction    . Tooth extraction    . Esophagogastroduodenoscopy N/A 04/05/2015    Procedure: ESOPHAGOGASTRODUODENOSCOPY (EGD);  Surgeon: Malissa HippoNajeeb U Rehman, MD;  Location: [facility];  Service: Endoscopy;  Laterality: N/A;  . Esophagogastroduodenoscopy (egd) with propofol N/A 05/21/2015    Procedure: ESOPHAGOGASTRODUODENOSCOPY (EGD) WITH PROPOFOL;  Surgeon: Meryl DareMalcolm T Stark, MD;  Location: [facility];  Service: Endoscopy;  Laterality: N/A;   Family History  Problem Relation Age of Onset  . CAD Mother     Living  . Diabetes Mellitus II Mother   . Stroke Mother   .  Hypertension Mother   . Congestive Heart Failure Mother   . Kidney disease Mother   . Fibromyalgia Mother   . Thyroid disease Mother   . Hyperlipidemia Mother   . Liver disease Mother   . Alcoholism Father 2835    Deceased  . Arthritis Maternal Grandmother   . Congestive Heart Failure Maternal Grandmother   . Hypertension Maternal Grandmother   . Lung cancer Maternal Grandfather   . Colon cancer Maternal Aunt   . Stomach cancer Maternal Aunt   . Heart disease Other     Paternal & Maternal  . Hypertension Other     Paternal & Maternal  . Hypertension Brother     x3  . Hypertension Sister     #1  . Bipolar disorder Sister     #1  . ADD / ADHD Son     x3  . Bipolar disorder Son     x3  . Asperger's syndrome Son    Social History  Substance Use Topics  . Smoking status: Current Every Day Smoker -- 1.00 packs/day for 23 years    Types: Cigarettes  . Smokeless tobacco: Never Used  . Alcohol Use: 0.0 oz/week    0 Standard drinks or equivalent per week     Comment: drank 7 beers on Thanksgiving Day 2016, last drank one yr before that    Review of  Systems  Constitutional: Negative for fever and chills.  Musculoskeletal: Positive for gait problem. Negative for joint swelling.  Neurological: Negative for dizziness, syncope, weakness and light-headedness.  All other systems reviewed and are negative.     Allergies  Atenolol; Other; and Tylenol  Home Medications   Prior to Admission medications   Medication Sig Start Date End Date Taking? Authorizing Provider  albuterol (PROVENTIL HFA;VENTOLIN HFA) 108 (90 Base) MCG/ACT inhaler Inhale 2 puffs into the lungs every 6 (six) hours as needed for wheezing or shortness of breath. 04/11/15   Jimmy Footman, MD  ALPRAZolam Prudy Feeler) 1 MG tablet Take 1 mg by mouth 3 (three) times daily.    Historical Provider, MD  aspirin 325 MG tablet Take 1 tablet (325 mg total) by mouth daily. 04/11/15   Jimmy Footman, MD   cloNIDine (CATAPRES) 0.2 MG tablet Take 0.2 mg by mouth 2 (two) times daily.    Historical Provider, MD  dexlansoprazole (DEXILANT) 60 MG capsule Take 60 mg by mouth daily.    Historical Provider, MD  furosemide (LASIX) 20 MG tablet Take 1 tablet (20 mg total) by mouth daily. 04/11/15   Jimmy Footman, MD  gabapentin (NEURONTIN) 400 MG capsule Take 2 capsules (800 mg total) by mouth 3 (three) times daily. 04/11/15   Jimmy Footman, MD  hydrOXYzine (VISTARIL) 50 MG capsule Take 1 capsule (50 mg total) by mouth 3 (three) times daily as needed for anxiety. 04/11/15   Jimmy Footman, MD  lisinopril (PRINIVIL,ZESTRIL) 40 MG tablet Take 1 tablet (40 mg total) by mouth 2 (two) times daily. 04/11/15   Jimmy Footman, MD  Naloxone HCl 4 MG/10ML SOLN Inject 4 mg as directed once as needed (in event of). 04/12/15   Hollice Espy, MD  ondansetron (ZOFRAN) 8 MG tablet Take 1 tablet (8 mg total) by mouth 4 (four) times daily -  before meals and at bedtime. 05/22/15   Joseph Art, DO  oxycodone (ROXICODONE) 30 MG immediate release tablet Take 30 mg by mouth every 4 (four) hours as needed for pain.    Historical Provider, MD  sitaGLIPtin-metformin (JANUMET) 50-1000 MG tablet Take 1 tablet by mouth 2 (two) times daily with a meal.    Historical Provider, MD  zolpidem (AMBIEN) 10 MG tablet Take 10 mg by mouth at bedtime.    Historical Provider, MD   BP 117/66 mmHg  Pulse 76  Temp(Src) 98.7 F (37.1 C) (Oral)  Resp 16  Ht  (1.88 m)  Wt 167.377 kg  BMI 47.36 kg/m2  SpO2 98% Physical Exam  Constitutional: He is oriented to person, place, and time. He appears well-developed and well-nourished. No distress.  HENT:  Head: Normocephalic and atraumatic.  Eyes: Conjunctivae and EOM are normal. Pupils are equal, round, and reactive to light.  Neck: Normal range of motion. Neck supple.  Cardiovascular: Normal rate and regular rhythm.   No murmur  heard. Pulmonary/Chest: Effort normal and breath sounds normal. No respiratory distress. He has no wheezes. He has no rales.  Abdominal: Bowel sounds are normal. He exhibits no distension. There is no tenderness. There is no rebound and no guarding.  Musculoskeletal:       Left shoulder: He exhibits decreased range of motion (45 degrees of flexion and abduction), tenderness, bony tenderness (along clavical and humeral head) and decreased strength (secondary to pain). He exhibits no swelling, no deformity and no laceration.       Left knee: He exhibits decreased range of motion (about 25  degrees of flexion, 180 degrees of extension). He exhibits no swelling, no effusion and no bony tenderness. Tenderness found. Medial joint line and lateral joint line tenderness noted.       Left ankle: He exhibits decreased range of motion. He exhibits no swelling, no ecchymosis, no deformity and normal pulse. Tenderness. Lateral malleolus and medial malleolus tenderness found. No head of 5th metatarsal tenderness found. Achilles tendon normal.  Lymphadenopathy:    He has no cervical adenopathy.  Neurological: He is alert and oriented to person, place, and time.  Skin: Skin is warm and dry. Abrasion (on left leg) noted.    ED Course  Procedures (including critical care time) Labs Review Labs Reviewed - No data to display  Imaging Review Dg Clavicle Left  07/15/2015  CLINICAL DATA:  Left shoulder injury after fall. EXAM: LEFT CLAVICLE - 2+ VIEWS COMPARISON:  None. FINDINGS: There is no evidence of fracture or other focal bone lesions. Soft tissues are unremarkable. IMPRESSION: Normal left clavicle. Electronically Signed   By: Lupita Raider, M.D.   On: 07/15/2015 11:39   Dg Ankle Complete Left  07/15/2015  CLINICAL DATA:  Acute left ankle pain after fall. Initial encounter. EXAM: LEFT ANKLE COMPLETE - 3+ VIEW COMPARISON:  None. FINDINGS: There is no evidence of fracture, dislocation, or joint effusion. There  is no evidence of arthropathy or other focal bone abnormality. Soft tissues are unremarkable. IMPRESSION: Normal left ankle. Electronically Signed   By: Lupita Raider, M.D.   On: 07/15/2015 11:44   Ct Shoulder Left Wo Contrast  07/15/2015  CLINICAL DATA:  Status post fall today with a left shoulder injury. Pain. Question dislocation. Initial encounter. EXAM: CT OF THE LEFT SHOULDER WITHOUT CONTRAST TECHNIQUE: Multidetector CT imaging was performed according to the standard protocol. Multiplanar CT image reconstructions were also generated. COMPARISON:  Plain films left shoulder this same day. FINDINGS: The shoulder is internally rotated but located. There is no posterior dislocation. No fracture is identified. Moderate acromioclavicular degenerative change is seen. No joint effusion is identified. The rotator cuff appears intact imaged lung parenchyma is clear. IMPRESSION: Negative for shoulder dislocation. No acute abnormality. Internal rotation of the humeral head is noted. Acromioclavicular osteoarthritis. Electronically Signed   By: Drusilla Kanner M.D.   On: 07/15/2015 12:48   Dg Shoulder Left  07/15/2015  CLINICAL DATA:  Hit door jam with left foot resulting in fall. EXAM: LEFT SHOULDER - 2+ VIEW COMPARISON:  None. FINDINGS: Apparent posterior dislocation of the glenohumeral joint. No fractures identified. Degenerative changes are noted at the acromioclavicular joint. IMPRESSION: 1. Posterior shoulder dislocation. Electronically Signed   By: Signa Kell M.D.   On: 07/15/2015 11:42   Dg Knee Complete 4 Views Left  07/15/2015  CLINICAL DATA:  Acute left knee pain after fall.  Initial encounter. EXAM: LEFT KNEE - COMPLETE 4+ VIEW COMPARISON:  None. FINDINGS: There is no evidence of fracture, dislocation, or joint effusion. Mild narrowing and osteophyte formation is seen involving the medial and lateral joint spaces. Soft tissues are unremarkable. IMPRESSION: Mild degenerative joint disease. No  acute abnormality seen in the left knee. Electronically Signed   By: Lupita Raider, M.D.   On: 07/15/2015 11:41   I have personally reviewed and evaluated these images and lab results as part of my medical decision-making.   EKG Interpretation None      MDM   Final diagnoses:  Fall, initial encounter  Shoulder sprain, left, initial encounter  Left knee sprain,  initial encounter  Left ankle sprain, initial encounter   Patient's evaluation negative for fracture. Initially, x-ray suggested posterior dislocation of left humerus, which was confirmed negative by CT. Likely multiple sprains. Pain improved with oxycodone. Will discharge home with sling and ace bandaging. Apply ice. Follow-up with PCP if no improvement. Patient agrees with plan.    Narda Bonds, MD 07/15/15 1448  Geoffery Lyons, MD 07/16/15 2148

## 2015-07-15 NOTE — ED Notes (Addendum)
Pt reports that he hit door jam with left foot , which caused him to step wrong and pt fell forward at which time his left foot went throught and patient went completely through floor

## 2015-07-15 NOTE — Discharge Instructions (Signed)
Wear arm sling and Ace bandage as applied for the next several days.  Apply ice to affected areas 20 minutes every 2 hours while awake for the next 2 days.  Oxycodone as prescribed as needed for pain.  Follow-up with your primary care doctor if not improving in the next week.   Shoulder Sprain A shoulder sprain is a partial or complete tear in one of the tough, fiber-like tissues (ligaments) in the shoulder. The ligaments in the shoulder help to hold the shoulder in place. CAUSES This condition may be caused by:  A fall.  A hit to the shoulder.  A twist of the arm. RISK FACTORS This condition is more likely to develop in:  People who play sports.  People who have problems with balance or coordination. SYMPTOMS Symptoms of this condition include:  Pain when moving the shoulder.  Limited ability to move the shoulder.  Swelling and tenderness on top of the shoulder.  Warmth in the shoulder.  A change in the shape of the shoulder.  Redness or bruising on the shoulder. DIAGNOSIS This condition is diagnosed with a physical exam. During the exam, you may be asked to do simple exercises with your shoulder. You may also have imaging tests, such as X-rays, MRI, or a CT scan. These tests can show how severe the sprain is. TREATMENT This condition may be treated with:  Rest.  Pain medicine.  Ice.  A sling or brace. This is used to keep the arm still while the shoulder is healing.  Physical therapy or rehabilitation exercises. These help to improve the range of motion and strength of the shoulder.  Surgery (rare). Surgery may be needed if the sprain caused a joint to become unstable. Surgery may also be needed to reduce pain. Some people may develop ongoing shoulder pain or lose some range of motion in the shoulder. However, most people do not develop long-term problems. HOME CARE INSTRUCTIONS  Rest.  Take over-the-counter and prescription medicines only as told by your  health care provider.  If directed, apply ice to the area:  Put ice in a plastic bag.  Place a towel between your skin and the bag.  Leave the ice on for 20 minutes, 2-3 times per day.  If you were given a shoulder sling or brace:  Wear it as told.  Remove it to shower or bathe.  Move your arm only as much as told by your health care provider, but keep your hand moving to prevent swelling.  If you were shown how to do any exercises, do them as told by your health care provider.  Keep all follow-up visits as told by your health care provider. This is important. SEEK MEDICAL CARE IF:  Your pain gets worse.  Your pain is not relieved with medicines.  You have increased redness or swelling. SEEK IMMEDIATE MEDICAL CARE IF:  You have a fever.  You cannot move your arm or shoulder.  You develop numbness or tingling in your arms, hands, or fingers.   This information is not intended to replace advice given to you by your health care provider. Make sure you discuss any questions you have with your health care provider.   Document Released: 07/04/2008 Document Revised: 11/06/2014 Document Reviewed: 06/10/2014 Elsevier Interactive Patient Education 2016 [employer]  Knee Sprain A knee sprain is a tear in one of the strong, fibrous tissues that connect the bones (ligaments) in your knee. The severity of the sprain depends on how much  of the ligament is torn. The tear can be either partial or complete. CAUSES  Often, sprains are a result of a fall or injury. The force of the impact causes the fibers of your ligament to stretch too much. This excess tension causes the fibers of your ligament to tear. SIGNS AND SYMPTOMS  You may have some loss of motion in your knee. Other symptoms include:  Bruising.  Pain in the knee area.  Tenderness of the knee to the touch.  Swelling. DIAGNOSIS  To diagnose a knee sprain, your health care provider will physically examine your knee.  Your health care provider may also suggest an X-ray exam of your knee to make sure no bones are broken. TREATMENT  If your ligament is only partially torn, treatment usually involves keeping the knee in a fixed position (immobilization) or bracing your knee for activities that require movement for several weeks. To do this, your health care provider will apply a bandage, cast, or splint to keep your knee from moving and to support your knee during movement until it heals. For a partially torn ligament, the healing process usually takes 4-6 weeks. If your ligament is completely torn, depending on which ligament it is, you may need surgery to reconnect the ligament to the bone or reconstruct it. After surgery, a cast or splint may be applied and will need to stay on your knee for 4-6 weeks while your ligament heals. HOME CARE INSTRUCTIONS  Keep your injured knee elevated to decrease swelling.  To ease pain and swelling, apply ice to the injured area:  Put ice in a plastic bag.  Place a towel between your skin and the bag.  Leave the ice on for 20 minutes, 2-3 times a day.  Only take medicine for pain as directed by your health care provider.  Do not leave your knee unprotected until pain and stiffness go away (usually 4-6 weeks).  If you have a cast or splint, do not allow it to get wet. If you have been instructed not to remove it, cover it with a plastic bag when you shower or bathe. Do not swim.  Your health care provider may suggest exercises for you to do during your recovery to prevent or limit permanent weakness and stiffness. SEEK IMMEDIATE MEDICAL CARE IF:  Your cast or splint becomes damaged.  Your pain becomes worse.  You have significant pain, swelling, or numbness below the cast or splint. MAKE SURE YOU:  Understand these instructions.  Will watch your condition.  Will get help right away if you are not doing well or get worse.   This information is not intended to  replace advice given to you by your health care provider. Make sure you discuss any questions you have with your health care provider.   Document Released: 02/15/2005 Document Revised: 03/08/2014 Document Reviewed: 09/27/2012 Elsevier Interactive Patient Education [employer]2016 [employer]

## 2015-08-20 ENCOUNTER — Encounter (HOSPITAL_COMMUNITY): Payer: Self-pay | Admitting: Emergency Medicine

## 2015-08-20 ENCOUNTER — Other Ambulatory Visit: Payer: Self-pay

## 2015-08-20 ENCOUNTER — Emergency Department (HOSPITAL_COMMUNITY)
Admission: EM | Admit: 2015-08-20 | Discharge: 2015-08-22 | Discharge status: Against Medical Advice | Payer: Medicare Other | Attending: Emergency Medicine | Admitting: Emergency Medicine

## 2015-08-20 ENCOUNTER — Other Ambulatory Visit (HOSPITAL_COMMUNITY): Payer: Self-pay

## 2015-08-20 DIAGNOSIS — Z7984 Long term (current) use of oral hypoglycemic drugs: Secondary | ICD-10-CM | POA: Diagnosis not present

## 2015-08-20 DIAGNOSIS — K226 Gastro-esophageal laceration-hemorrhage syndrome: Secondary | ICD-10-CM | POA: Diagnosis not present

## 2015-08-20 DIAGNOSIS — J45909 Unspecified asthma, uncomplicated: Secondary | ICD-10-CM | POA: Insufficient documentation

## 2015-08-20 DIAGNOSIS — R42 Dizziness and giddiness: Secondary | ICD-10-CM | POA: Insufficient documentation

## 2015-08-20 DIAGNOSIS — H532 Diplopia: Secondary | ICD-10-CM | POA: Diagnosis not present

## 2015-08-20 DIAGNOSIS — R1110 Vomiting, unspecified: Secondary | ICD-10-CM | POA: Diagnosis present

## 2015-08-20 DIAGNOSIS — R109 Unspecified abdominal pain: Secondary | ICD-10-CM

## 2015-08-20 DIAGNOSIS — E119 Type 2 diabetes mellitus without complications: Secondary | ICD-10-CM

## 2015-08-20 DIAGNOSIS — I10 Essential (primary) hypertension: Secondary | ICD-10-CM | POA: Diagnosis not present

## 2015-08-20 DIAGNOSIS — G8929 Other chronic pain: Secondary | ICD-10-CM

## 2015-08-20 DIAGNOSIS — R200 Anesthesia of skin: Secondary | ICD-10-CM

## 2015-08-20 DIAGNOSIS — Z8673 Personal history of transient ischemic attack (TIA), and cerebral infarction without residual deficits: Secondary | ICD-10-CM | POA: Insufficient documentation

## 2015-08-20 DIAGNOSIS — R1114 Bilious vomiting: Secondary | ICD-10-CM

## 2015-08-20 DIAGNOSIS — Z79899 Other long term (current) drug therapy: Secondary | ICD-10-CM | POA: Diagnosis not present

## 2015-08-20 DIAGNOSIS — E785 Hyperlipidemia, unspecified: Secondary | ICD-10-CM | POA: Insufficient documentation

## 2015-08-20 DIAGNOSIS — E1165 Type 2 diabetes mellitus with hyperglycemia: Secondary | ICD-10-CM | POA: Diagnosis not present

## 2015-08-20 DIAGNOSIS — R531 Weakness: Secondary | ICD-10-CM | POA: Diagnosis present

## 2015-08-20 DIAGNOSIS — I639 Cerebral infarction, unspecified: Secondary | ICD-10-CM

## 2015-08-20 DIAGNOSIS — F17210 Nicotine dependence, cigarettes, uncomplicated: Secondary | ICD-10-CM | POA: Diagnosis not present

## 2015-08-20 DIAGNOSIS — F602 Antisocial personality disorder: Secondary | ICD-10-CM | POA: Diagnosis present

## 2015-08-20 DIAGNOSIS — Z6841 Body Mass Index (BMI) 40.0 and over, adult: Secondary | ICD-10-CM | POA: Insufficient documentation

## 2015-08-20 DIAGNOSIS — R1031 Right lower quadrant pain: Secondary | ICD-10-CM | POA: Diagnosis present

## 2015-08-20 LAB — CBC
HCT: 43.9 % (ref 39.0–52.0)
HEMOGLOBIN: 14.7 g/dL (ref 13.0–17.0)
MCH: 30.8 pg (ref 26.0–34.0)
MCHC: 33.5 g/dL (ref 30.0–36.0)
MCV: 92 fL (ref 78.0–100.0)
PLATELETS: 209 10*3/uL (ref 150–400)
RBC: 4.77 MIL/uL (ref 4.22–5.81)
RDW: 12.9 % (ref 11.5–15.5)
WBC: 9.2 10*3/uL (ref 4.0–10.5)

## 2015-08-20 LAB — COMPREHENSIVE METABOLIC PANEL
ALBUMIN: 3.4 g/dL — AB (ref 3.5–5.0)
ALK PHOS: 57 U/L (ref 38–126)
ALT: 73 U/L — AB (ref 17–63)
AST: 87 U/L — AB (ref 15–41)
Anion gap: 7 (ref 5–15)
BUN: 8 mg/dL (ref 6–20)
CALCIUM: 8.6 mg/dL — AB (ref 8.9–10.3)
CHLORIDE: 99 mmol/L — AB (ref 101–111)
CO2: 28 mmol/L (ref 22–32)
CREATININE: 0.93 mg/dL (ref 0.61–1.24)
GFR calc non Af Amer: >60 mL/min (ref >60)
GLUCOSE: 223 mg/dL — AB (ref 65–99)
Potassium: 5.5 mmol/L — ABNORMAL HIGH (ref 3.5–5.1)
SODIUM: 134 mmol/L — AB (ref 135–145)
Total Bilirubin: 1.9 mg/dL — ABNORMAL HIGH (ref 0.3–1.2)
Total Protein: 7 g/dL (ref 6.5–8.1)

## 2015-08-20 LAB — URINALYSIS, ROUTINE W REFLEX MICROSCOPIC
BILIRUBIN URINE: NEGATIVE
GLUCOSE, UA: NEGATIVE mg/dL
HGB URINE DIPSTICK: NEGATIVE
Ketones, ur: NEGATIVE mg/dL
Leukocytes, UA: NEGATIVE
Nitrite: NEGATIVE
Protein, ur: NEGATIVE mg/dL
SPECIFIC GRAVITY, URINE: 1.023 (ref 1.005–1.030)
pH: 6.5 (ref 5.0–8.0)

## 2015-08-20 LAB — LIPASE, BLOOD: LIPASE: 21 U/L (ref 11–51)

## 2015-08-20 LAB — CBG MONITORING, ED: Glucose-Capillary: 252 mg/dL — ABNORMAL HIGH (ref 65–99)

## 2015-08-20 MED ORDER — HYDROMORPHONE HCL 2 MG/ML IJ SOLN: 2.0000 mg | Freq: Once | INTRAMUSCULAR | Status: DC

## 2015-08-20 MED ORDER — ONDANSETRON 4 MG PO TBDP: 4.0000 mg | ORAL_TABLET | Freq: Once | ORAL | Status: DC

## 2015-08-20 NOTE — ED Notes (Signed)
Patient presents with right sided abdominal pain and 10-15 episodes of emesis x1 day. Denies fever, diarrhea.

## 2015-08-20 NOTE — ED Notes (Signed)
Per EMS pt woke up this morning with abd pain  Pt went about his day and was trying to drink fluids  Pt states he had an episode of vomiting that was dark red blood  Pt states the pain then became sharp in his right lower quadrant and radiated to his back and chest  Pt states he has had numbness in his left side for about the past hour and a half  No neuro deficits noted  Pt was given an aspirin 324mg  by the fire dept for his c/o chest pain  12 lead unremarkable  EMS was unable to obtain an IV  Pt was also c/o shortness of breath  Room air sat was 93% so oxygen 4 liters/min was applied via nasal cannula

## 2015-08-20 NOTE — ED Provider Notes (Signed)
CSN: [id number]     Arrival date & time 08/20/15  2151 History  By signing my name below, I, Phillis Haggis, attest that this documentation has been prepared under the direction and in the presence of Derwood Kaplan, MD. Electronically Signed: Phillis Haggis, ED Scribe. 08/20/2015. 12:11 AM.     Chief Complaint  Patient presents with  . Blurred Vision  . Hematemesis  . Abdominal Pain   The history is provided by the patient. No language interpreter was used.  HPI Comments: JORY TANGUMA is a 40 y.o. Male with a hx of HTN, DM, transient cerebral ischemia, gallstones, and OSA brought in by EMS who presents to the Emergency Department complaining of gradually worsening RLQ abdominal pain onset 2 days ago. Pt reports associated vomiting of bilious material and bright red hematemesis. Pt reports hx of the same over the past 3 years. He is unable to tolerate food or liquids. He reports 10-15 episodes of emesis. His last BM was earlier this morning with bright red blood. He denies hx of abdominal surgeries, heavy alcohol use, or stomach ulcers. Pt has not taken anything for his symptoms. He denies diarrhea, dysuria, or hematuria.   Pt has been complaining of constant dizziness and left sided numbness that began 4 hours ago.  He continues to have these symptoms. He reports associated left sided double vision. He states that the images will be next to each other. He reports worsening double vision with looking to the left and further away. He states that his last seen normal was at 7 PM. He reports hx of "mini stroke" 3 years ago and had similar symptoms. Pt reports smoking 1 ppd. He denies hx of cocaine use.   Past Medical History  Diagnosis Date  . Heart valve disorder     s/p echocardiogram  . Hypertension   . Asthma   . Diabetes mellitus   . Hyperlipidemia   . Anaphylactic reaction   . Bipolar disorder (HCC)   . Adult ADHD   . Morbidly obese (HCC)   . Transient cerebral ischemia      Unknown  . OSA (obstructive sleep apnea)   . Anxiety   . Complication of anesthesia     Per patient difficult intubation;  . Difficult intubation     Per patient   Past Surgical History  Procedure Laterality Date  . Carpal tunnel release    . Nose surgery    . Wisdom tooth extraction    . Tooth extraction    . Esophagogastroduodenoscopy N/A 04/05/2015    Procedure: ESOPHAGOGASTRODUODENOSCOPY (EGD);  Surgeon: Malissa Hippo, MD;  Location: [facility];  Service: Endoscopy;  Laterality: N/A;  . Esophagogastroduodenoscopy (egd) with propofol N/A 05/21/2015    Procedure: ESOPHAGOGASTRODUODENOSCOPY (EGD) WITH PROPOFOL;  Surgeon: Meryl Dare, MD;  Location: [facility];  Service: Endoscopy;  Laterality: N/A;   Family History  Problem Relation Age of Onset  . CAD Mother     Living  . Diabetes Mellitus II Mother   . Stroke Mother   . Hypertension Mother   . Congestive Heart Failure Mother   . Kidney disease Mother   . Fibromyalgia Mother   . Thyroid disease Mother   . Hyperlipidemia Mother   . Liver disease Mother   . Alcoholism Father 27    Deceased  . Arthritis Maternal Grandmother   . Congestive Heart Failure Maternal Grandmother   . Hypertension Maternal Grandmother   . Lung cancer Maternal Grandfather   .  Colon cancer Maternal Aunt   . Stomach cancer Maternal Aunt   . Heart disease Other     Paternal & Maternal  . Hypertension Other     Paternal & Maternal  . Hypertension Brother     x3  . Hypertension Sister     #1  . Bipolar disorder Sister     #1  . ADD / ADHD Son     x3  . Bipolar disorder Son     x3  . Asperger's syndrome Son    Social History  Substance Use Topics  . Smoking status: Current Every Day Smoker -- 1.00 packs/day for 23 years    Types: Cigarettes  . Smokeless tobacco: Never Used  . Alcohol Use: 0.0 oz/week    0 Standard drinks or equivalent per week     Comment: drank 7 beers on Thanksgiving Day 2016, last drank one yr before that     Review of Systems 10 Systems reviewed and all are negative for acute change except as noted in the HPI.  Allergies  Atenolol; Other; and Tylenol  Home Medications   Prior to Admission medications   Medication Sig Start Date End Date Taking? Authorizing Provider  albuterol (PROVENTIL HFA;VENTOLIN HFA) 108 (90 Base) MCG/ACT inhaler Inhale 2 puffs into the lungs every 6 (six) hours as needed for wheezing or shortness of breath. 04/11/15  Yes Jimmy Footman, MD  alprazolam Prudy Feeler) 2 MG tablet Take 2 mg by mouth 3 (three) times daily as needed. 08/14/15  Yes Historical Provider, MD  amphetamine-dextroamphetamine (ADDERALL) 30 MG tablet Take 2 tablets by mouth 2 (two) times daily. 08/14/15  Yes Historical Provider, MD  aspirin 325 MG tablet Take 1 tablet (325 mg total) by mouth daily. 04/11/15  Yes Jimmy Footman, MD  cloNIDine (CATAPRES) 0.2 MG tablet Take 0.2 mg by mouth 2 (two) times daily.   Yes Historical Provider, MD  dexlansoprazole (DEXILANT) 60 MG capsule Take 60 mg by mouth daily.   Yes Historical Provider, MD  furosemide (LASIX) 20 MG tablet Take 1 tablet (20 mg total) by mouth daily. 04/11/15  Yes Jimmy Footman, MD  gabapentin (NEURONTIN) 400 MG capsule Take 2 capsules (800 mg total) by mouth 3 (three) times daily. 04/11/15  Yes Jimmy Footman, MD  lisinopril (PRINIVIL,ZESTRIL) 40 MG tablet Take 1 tablet (40 mg total) by mouth 2 (two) times daily. 04/11/15  Yes Jimmy Footman, MD  ondansetron (ZOFRAN) 8 MG tablet Take 1 tablet (8 mg total) by mouth 4 (four) times daily -  before meals and at bedtime. 05/22/15  Yes Joseph Art, DO  oxycodone (ROXICODONE) 30 MG immediate release tablet Take 30 mg by mouth 5 (five) times daily.   Yes Historical Provider, MD  sitaGLIPtin-metformin (JANUMET) 50-1000 MG tablet Take 1 tablet by mouth 2 (two) times daily with a meal.   Yes Historical Provider, MD  zolpidem (AMBIEN) 10 MG tablet Take 10 mg  by mouth at bedtime.   Yes Historical Provider, MD  hydrOXYzine (VISTARIL) 50 MG capsule Take 1 capsule (50 mg total) by mouth 3 (three) times daily as needed for anxiety. Patient not taking: Reported on 08/20/2015 04/11/15   Jimmy Footman, MD  Naloxone HCl 4 MG/10ML SOLN Inject 4 mg as directed once as needed (in event of). Patient not taking: Reported on 08/20/2015 04/12/15   Hollice Espy, MD  oxyCODONE (OXY IR/ROXICODONE) 5 MG immediate release tablet Take 1 tablet (5 mg total) by mouth every 4 (four) hours as needed for  severe pain. Patient not taking: Reported on 08/20/2015 07/15/15   Geoffery Lyons, MD   BP 144/88 mmHg  Pulse 93  Temp(Src) 98.6 F (37 C) (Oral)  Resp 15  Ht 6\' 2"  (1.88 m)  Wt 375 lb (170.099 kg)  BMI 48.13 kg/m2  SpO2 97% Physical Exam  Constitutional: He appears well-developed and well-nourished.  HENT:  Head: Normocephalic and atraumatic.  Mouth/Throat: Uvula is midline, oropharynx is clear and moist and mucous membranes are normal.  Eyes: EOM are normal. Pupils are equal, round, and reactive to light.  Neck: Normal range of motion. Neck supple.  Cardiovascular: Normal rate, regular rhythm and normal heart sounds.  Exam reveals no gallop and no friction rub.   No murmur heard. Pulmonary/Chest: Effort normal and breath sounds normal. He has no wheezes.  Anterior lungs clear  Abdominal: Soft. Bowel sounds are normal. There is no tenderness.  Pt has soft abdomen with right sided abdominal tenderness, worse in the lower quadrant  Musculoskeletal: Normal range of motion. He exhibits edema.  Trace edema in lower extremities  Neurological: He is alert.  Cranial nerves 2-12 intact except for numbness to left side of face; gross sensory exam of upper and lower extremities reveal numbness to the left side; upper extremity strength 4+/5 but positive pronator drift on the left; grip strength is weaker on left side; lower extremity strength 4+/5 but positive SLR  on the left; cerebellar exam finger to nose reveals no dysmetria; cerebellar exam heel to shin appears normal; no dysartria  Skin: Skin is warm and dry.  Psychiatric: He has a normal mood and affect. His behavior is normal.  Nursing note and vitals reviewed.   ED Course  Procedures (including critical care time) DIAGNOSTIC STUDIES: Oxygen Saturation is 97% on RA, normal by my interpretation.    COORDINATION OF CARE: 11:52 PM-Discussed treatment plan which includes labs and imaging with pt at bedside and pt agreed to plan.    Labs Review Labs Reviewed  COMPREHENSIVE METABOLIC PANEL - Abnormal; Notable for the following:    Sodium 134 (*)    Potassium 5.5 (*)    Chloride 99 (*)    Glucose, Bld 223 (*)    Calcium 8.6 (*)    Albumin 3.4 (*)    AST 87 (*)    ALT 73 (*)    Total Bilirubin 1.9 (*)    All other components within normal limits  CBG MONITORING, ED - Abnormal; Notable for the following:    Glucose-Capillary 252 (*)    All other components within normal limits  LIPASE, BLOOD  CBC  URINALYSIS, ROUTINE W REFLEX MICROSCOPIC (NOT AT [facility])  PROTIME-INR  ETHANOL  APTT  DIFFERENTIAL  URINE RAPID DRUG SCREEN, HOSP PERFORMED  I-STAT CHEM 8, ED  I-STAT TROPOININ, ED    Imaging Review No results found. I have personally reviewed and evaluated these images and lab results as part of my medical decision-making.   EKG Interpretation None      MDM   Final diagnoses:  Left sided numbness  Left-sided weakness  Mallory-Weiss tear  Diplopia  Chronic abdominal pain  Bilious vomiting with nausea    Pt comes in with cc of n/v/abd pain. Symptoms are nor new. He is morbidly obese, and exam shows R sided tenderness, worse in the lower quadrant w/o any peritoneal signs. Hx of gall stones, LFTs elevated, but not new, lipase neg. No murphy's on exam. We don't think he merits [place] emergently for this chronic problem.  WC is normal. Slightly elevated blood sugar w/o DKA. Will  consider reassessment for imaging if fails oral challenge.  Pt also having L sided numbness. On exam, he has subjective numbness on the entire L side numbness, L sided weakness (drift). NIHSS is 4. Pt has monoocular diplopia, L side.  Dr. Roseanne RenoStewart, Neurology wants ER to ER transfer as Code stroke. Dr. Wilkie AyeHorton from EM aware.    Derwood KaplanAnkit Deyani Hegarty, MD 08/21/15 0045

## 2015-08-21 ENCOUNTER — Emergency Department (HOSPITAL_COMMUNITY): Payer: Medicare Other

## 2015-08-21 ENCOUNTER — Encounter (HOSPITAL_COMMUNITY): Payer: Self-pay | Admitting: Family Medicine

## 2015-08-21 DIAGNOSIS — M6289 Other specified disorders of muscle: Secondary | ICD-10-CM

## 2015-08-21 DIAGNOSIS — F602 Antisocial personality disorder: Secondary | ICD-10-CM | POA: Diagnosis not present

## 2015-08-21 DIAGNOSIS — I10 Essential (primary) hypertension: Secondary | ICD-10-CM

## 2015-08-21 DIAGNOSIS — E119 Type 2 diabetes mellitus without complications: Secondary | ICD-10-CM | POA: Diagnosis not present

## 2015-08-21 DIAGNOSIS — R1110 Vomiting, unspecified: Secondary | ICD-10-CM

## 2015-08-21 DIAGNOSIS — R200 Anesthesia of skin: Secondary | ICD-10-CM | POA: Diagnosis not present

## 2015-08-21 DIAGNOSIS — Z794 Long term (current) use of insulin: Secondary | ICD-10-CM

## 2015-08-21 DIAGNOSIS — R531 Weakness: Secondary | ICD-10-CM | POA: Diagnosis present

## 2015-08-21 LAB — I-STAT CHEM 8, ED
BUN: 8 mg/dL (ref 6–20)
CHLORIDE: 101 mmol/L (ref 101–111)
CREATININE: 0.6 mg/dL — AB (ref 0.61–1.24)
Calcium, Ion: 1.01 mmol/L — ABNORMAL LOW (ref 1.12–1.23)
GLUCOSE: 146 mg/dL — AB (ref 65–99)
HCT: 43 % (ref 39.0–52.0)
Hemoglobin: 14.6 g/dL (ref 13.0–17.0)
POTASSIUM: 4.2 mmol/L (ref 3.5–5.1)
Sodium: 137 mmol/L (ref 135–145)
TCO2: 25 mmol/L (ref 0–100)

## 2015-08-21 LAB — CBG MONITORING, ED: Glucose-Capillary: 135 mg/dL — ABNORMAL HIGH (ref 65–99)

## 2015-08-21 LAB — DIFFERENTIAL
Basophils Absolute: 0 10*3/uL (ref 0.0–0.1)
Basophils Relative: 0 %
Eosinophils Absolute: 0.3 10*3/uL (ref 0.0–0.7)
Eosinophils Relative: 3 %
LYMPHS ABS: 2.7 10*3/uL (ref 0.7–4.0)
LYMPHS PCT: 32 %
Monocytes Absolute: 0.6 10*3/uL (ref 0.1–1.0)
Monocytes Relative: 7 %
NEUTROS ABS: 4.7 10*3/uL (ref 1.7–7.7)
NEUTROS PCT: 58 %

## 2015-08-21 LAB — RAPID URINE DRUG SCREEN, HOSP PERFORMED
Amphetamines: NOT DETECTED
BARBITURATES: NOT DETECTED
BENZODIAZEPINES: POSITIVE — AB
COCAINE: NOT DETECTED
Opiates: POSITIVE — AB
TETRAHYDROCANNABINOL: NOT DETECTED

## 2015-08-21 LAB — I-STAT TROPONIN, ED: TROPONIN I, POC: 0 ng/mL (ref 0.00–0.08)

## 2015-08-21 LAB — POTASSIUM: Potassium: 5.6 mmol/L — ABNORMAL HIGH (ref 3.5–5.1)

## 2015-08-21 MED ORDER — PROCHLORPERAZINE EDISYLATE 5 MG/ML IJ SOLN
10.0000 mg | Freq: Once | INTRAMUSCULAR | Status: AC
  Administered 2015-08-21: 10 mg via INTRAVENOUS
  Filled 2015-08-21: qty 2

## 2015-08-21 MED ORDER — DIPHENHYDRAMINE HCL 50 MG/ML IJ SOLN
25.0000 mg | Freq: Once | INTRAMUSCULAR | Status: AC
  Administered 2015-08-21: 25 mg via INTRAVENOUS
  Filled 2015-08-21: qty 1

## 2015-08-21 MED ORDER — SODIUM CHLORIDE 0.9 % IV BOLUS (SEPSIS)
500.0000 mL | Freq: Once | INTRAVENOUS | Status: AC
  Administered 2015-08-21: 500 mL via INTRAVENOUS

## 2015-08-21 MED ORDER — LORAZEPAM 2 MG/ML IJ SOLN: 1.0000 mg | Freq: Once | INTRAMUSCULAR | Status: DC | PRN

## 2015-08-21 NOTE — Care Management Note (Signed)
Case Management Note  Patient Details  Name: Bryan Gallegos MRN: [id number] Date of Birth: 02/09/1976  Subjective/Objective:                  40 yo male Chief Complaint: Vomiting and left sided weakness.  From home.  Action/Plan: Follow for disposition needs.   Expected Discharge Date:  08/23/15               Expected Discharge Plan:  Home/Self Care  In-House Referral:     Discharge planning Services  CM Consult  Post Acute Care Choice:    Choice offered to:     DME Arranged:    DME Agency:     HH Arranged:    HH Agency:     Status of Service:  In process, will continue to follow  If discussed at Long Length of Stay Meetings, dates discussed:    Additional Comments:  Bryan Gallegos, Bryan Holway, RN 08/21/2015, 9:17 AM

## 2015-08-21 NOTE — ED Notes (Signed)
Patient states he is seeing double but when 2 fingers are held up states he only sees 1. States his right eye does not usually droop

## 2015-08-21 NOTE — ED Provider Notes (Addendum)
  By signing my name below, I, Suzan SlickAshley N. Elon SpannerLeger, attest that this documentation has been prepared under the direction and in the presence of Shon Batonourtney F Horton, MD.  Electronically Signed: Suzan SlickAshley N. Elon SpannerLeger, ED Scribe. 08/21/2015. 1:03 AM.    Chief Complaint  Patient presents with  . Blurred Vision  . Hematemesis  . Abdominal Pain    HPI Comments: Bryan Gallegos is a 40 y.o. male with a PMHx of HTN, DM, transient cerebral ischemia, gallstones, and OSA who presents to the Emergency Department complaining of chronic in nature RLQ abdominal pain x 3 years; worsened in the last 2 days. He also reports associated intermittent hematemesis. 10-15 episodes of emesis reported in the last few days. Last normal bowel movement earlier this morning consisting of bright red blood. Pt denies any abdominal surgeries. No prior history of stomach ulcers. Pt denies any diarrhea.  [facility] possible code stroke as he reports new onset, persistent HA, dizziness, L sided numbness, and double vision x 4 hours. Inability to shut the R eye also reported, however, this is not new for him. Last seen normal at approximately 7 PM this evening. Pt reports a hx of a "mini stroke" 3 years prior with similar symptoms.  On exam, patient markedly obese. Nontoxic. No obvious facial droop, is unable to close right eye. For headache not involved. 4+ out of 5 left upper and lower extremity weakness. Question effort.   Patient evaluated by Dr. Roseanne RenoStewart. Code stroke canceled. Patient will need an MRI. He does not fit an MRI scanner.  I treated the patient for complicated migraine given his headache. Will admit for TIA workup per Dr. Roseanne RenoStewart given that the patient cannot fit in the scanner.  He is opiate and benzodiazepine positive. Initial potassium 5.5. Repeat 5.6; however, on Chem-8 is 4.2.  No acute EKG changes. Otherwise lab workup is unremarkable.    PCP: No PCP Per Patient     I personally  performed the services described in this documentation, which was scribed in my presence. The recorded information has been reviewed and is accurate.   Shon Batonourtney F Horton, MD 08/21/15 [id number]  Shon Batonourtney F Horton, MD 08/21/15 [phone]

## 2015-08-21 NOTE — ED Notes (Signed)
Patient transported to MRI 

## 2015-08-21 NOTE — ED Notes (Addendum)
Patient arrived from [facility] via [facility].  Called EMS for numerous issues - c/o abd pain that has been going on for 3 years, wanting to have a behavorial assessment because "nerves are all out of whack", weakness to his left arm and leg, having double vision, headache.  Dr Roseanne RenoStewart in to assess patient - no code stroke at this time  [facility] reported left sided weakness and right facial droop and patient oriented to person and place only

## 2015-08-21 NOTE — ED Notes (Signed)
Called [facility] to cancel code stroke

## 2015-08-21 NOTE — Consult Note (Signed)
Hospitalist Consult Note  Patient Name: Bryan Gallegos     ZOX:[id number]    DOB: 06/25/1975    DOA: 08/20/2015 PCP: No PCP Per Patient   Patient coming from: Home     Chief Complaint: Vomiting and left sided weakness  HPI: Bryan Gallegos is a 40 y.o. male with a past medical history significant for NIDDM, HTN, PTSD and antisocial personality, oxycodone/heroin abuse who presents with various complaints.  He called EMS tonight because of vomiting.  To me, he reports chiefly that his chronic vomiting is "much worse" in the last 7 days, vomiting anything he eats or drinks, sometimes with blood in his vomit.  When EMS arrived, he also complained to them of left sided numbness and weakness and SOB, and so he was given aspirin and [facility].  In the ED, he complained of similar symptoms.  Because of reported diplopia, claims of history of TIA with similar symptoms, CODE STROKE was called and [facility] was [facility].  CT head there showed equivocal findings in the RIGHT MCA territory, not well characterized on CT.    From chart review, it appears that the patient has presented to the ER and his [facility] with left sided weakness, most recently in Feb 2017, when he had a normal MRI and inconsistent exam and was discharged home.    Most recently, he was admitted here in March for intractable nausea and vomiting with reported hematemesis, had normal endoscopy and HIDA scan and was discharged with ondansetron.    Review of the [place] controlled substances registry shows that the patient is prescribed alprazolam by a Dr. Bridgette HabermannSchmid (who has recently ordered the patient a shoulder MRI in our system) and Adderall, but that he has not been prescibed oxycodone or any other opiate since a 2 day supply in mid-May by Dr. Judd Lienelo [facility] ED.    Review of Systems:  All other systems negative except as just noted or noted in the history of present illness.  Past Medical History    Diagnosis Date  . Heart valve disorder     s/p echocardiogram  . Hypertension   . Asthma   . Diabetes mellitus   . Hyperlipidemia   . Anaphylactic reaction   . Bipolar disorder (HCC)   . Adult ADHD   . Morbidly obese (HCC)   . Transient cerebral ischemia     Unknown  . OSA (obstructive sleep apnea)   . Anxiety   . Complication of anesthesia     Per patient difficult intubation;  . Difficult intubation     Per patient    Past Surgical History  Procedure Laterality Date  . Carpal tunnel release    . Nose surgery    . Wisdom tooth extraction    . Tooth extraction    . Esophagogastroduodenoscopy N/A 04/05/2015    Procedure: ESOPHAGOGASTRODUODENOSCOPY (EGD);  Surgeon: Malissa HippoNajeeb U Rehman, MD;  [facility];  Service: Endoscopy;  Laterality: N/A;  . Esophagogastroduodenoscopy (egd) with propofol N/A 05/21/2015    Procedure: ESOPHAGOGASTRODUODENOSCOPY (EGD) WITH PROPOFOL;  Surgeon: Meryl DareMalcolm T Stark, MD;  Location: [facility] ENDOSCOPY;  Service: Endoscopy;  Laterality: N/A;    Social History: Patient lives with his wife, children and mother.  Patient walks unassisted.  He smokes.    Allergies  Allergen Reactions  . Atenolol Anaphylaxis  . Other Anaphylaxis    "Prednisone like drugs"  . Tylenol [Acetaminophen] Other (See Comments)    GI  Bleed    Family history: family history includes ADD / ADHD in his son; Alcoholism (age of onset: 42) in his father; Arthritis in his maternal grandmother; Asperger's syndrome in his son; Bipolar disorder in his sister and son; CAD in his mother; Colon cancer in his maternal aunt; Congestive Heart Failure in his maternal grandmother and mother; Crohn's disease in his other; Diabetes Mellitus II in his mother; Fibromyalgia in his mother; Heart disease in his other; Hyperlipidemia in his mother; Hypertension in his brother, maternal grandmother, mother, other, and sister; Kidney disease in his mother; Liver disease in his mother; Lung cancer in his  maternal grandfather; Stomach cancer in his maternal aunt; Stroke in his mother; Thyroid disease in his mother.  Prior to Admission medications   Medication Sig Start Date End Date Taking? Authorizing Provider  albuterol (PROVENTIL HFA;VENTOLIN HFA) 108 (90 Base) MCG/ACT inhaler Inhale 2 puffs into the lungs every 6 (six) hours as needed for wheezing or shortness of breath. 04/11/15  Yes Jimmy Footman, MD  alprazolam Prudy Feeler) 2 MG tablet Take 2 mg by mouth 3 (three) times daily as needed. 08/14/15  Yes Historical Provider, MD  amphetamine-dextroamphetamine (ADDERALL) 30 MG tablet Take 2 tablets by mouth 2 (two) times daily. 08/14/15  Yes Historical Provider, MD  aspirin 325 MG tablet Take 1 tablet (325 mg total) by mouth daily. 04/11/15  Yes Jimmy Footman, MD  cloNIDine (CATAPRES) 0.2 MG tablet Take 0.2 mg by mouth 2 (two) times daily.   Yes Historical Provider, MD  dexlansoprazole (DEXILANT) 60 MG capsule Take 60 mg by mouth daily.   Yes Historical Provider, MD  furosemide (LASIX) 20 MG tablet Take 1 tablet (20 mg total) by mouth daily. 04/11/15  Yes Jimmy Footman, MD  gabapentin (NEURONTIN) 400 MG capsule Take 2 capsules (800 mg total) by mouth 3 (three) times daily. 04/11/15  Yes Jimmy Footman, MD  lisinopril (PRINIVIL,ZESTRIL) 40 MG tablet Take 1 tablet (40 mg total) by mouth 2 (two) times daily. 04/11/15  Yes Jimmy Footman, MD  ondansetron (ZOFRAN) 8 MG tablet Take 1 tablet (8 mg total) by mouth 4 (four) times daily -  before meals and at bedtime. 05/22/15  Yes Joseph Art, DO  oxycodone (ROXICODONE) 30 MG immediate release tablet Take 30 mg by mouth 5 (five) times daily.   Yes Historical Provider, MD  sitaGLIPtin-metformin (JANUMET) 50-1000 MG tablet Take 1 tablet by mouth 2 (two) times daily with a meal.   Yes Historical Provider, MD  zolpidem (AMBIEN) 10 MG tablet Take 10 mg by mouth at bedtime.   Yes Historical Provider, MD  hydrOXYzine  (VISTARIL) 50 MG capsule Take 1 capsule (50 mg total) by mouth 3 (three) times daily as needed for anxiety. Patient not taking: Reported on 08/20/2015 04/11/15   Jimmy Footman, MD  Naloxone HCl 4 MG/10ML SOLN Inject 4 mg as directed once as needed (in event of). Patient not taking: Reported on 08/20/2015 04/12/15   Hollice Espy, MD  oxyCODONE (OXY IR/ROXICODONE) 5 MG immediate release tablet Take 1 tablet (5 mg total) by mouth every 4 (four) hours as needed for severe pain. Patient not taking: Reported on 08/20/2015 07/15/15   Geoffery Lyons, MD     Physical Exam: BP 170/101 mmHg  Pulse 86  Temp(Src) 97.7 F (36.5 C) (Oral)  Resp 21  Ht 6\' 2"  (1.88 m)  Wt 170.099 kg (375 lb)  BMI 48.13 kg/m2  SpO2 99% General appearance: Obese adult male, sleeping but rousable and alert and  oriented and in no acute distress.   Eyes: Anicteric, conjunctiva pink, lids and lashes normal.    Right Ptosis, described in prior notes. ENT: No nasal deformity, discharge, or epistaxis.  OP moist without lesions.  Mallampati IV. Skin: Warm and dry.  Numerous hyperpigmented skin lesions, from old scars. Cardiac: RRR, nl S1-S2, no murmurs appreciated.  Respiratory: Normal respiratory rate and rhythm.  CTAB without rales or wheezes. Abdomen: Abdomen soft without rigidity.  No TTP.  MSK: No deformities or effusions. Neuro: Reported visual field deficit in left and upper fields.  Pupils are 4 mm and reactive to 3 mm. Extraocular movements are intact, without nystagmus. Complains of "I see two of you" with only one eye open.  Cranial nerve 5 is within normal limits. Cranial nerve 7 is symmetrical. Cranial nerve 8 is within normal limits. Cranial nerves 9 and 10 reveal equal palate elevation. Cranial nerve 11 reveals sternocleidomastoid strong. Cranial nerve 12 is midline. The patient states he cannot lift his left arm more than 2 inches off his knee because of weakness, not pain.  When I passively lift it,  it falls.  The patient is oriented to time, place and person. Speech is fluent. Naming is grossly intact. Recall, recent and remote, as well as general fund of knowledge seem within normal limits. Attention span and concentration are within normal limits.   Psych: Behavior appropriate.  Affect odd.  No evidence of aural or visual hallucinations or delusions.       Labs on Admission:  I have personally reviewed following labs and imaging studies: CBC:  Recent Labs Lab 08/20/15 2231 08/21/15 0136  WBC 9.2  --   HGB 14.7 14.6  HCT 43.9 43.0  MCV 92.0  --   PLT 209  --    Basic Metabolic Panel:  Recent Labs Lab 08/20/15 2231 08/21/15 0125 08/21/15 0136  NA 134*  --  137  K 5.5* 5.6* 4.2  CL 99*  --  101  CO2 28  --   --   GLUCOSE 223*  --  146*  BUN 8  --  8  CREATININE 0.93  --  0.60*  CALCIUM 8.6*  --   --    GFR: Estimated Creatinine Clearance: 201.8 mL/min (by C-G formula based on Cr of 0.6). Liver Function Tests:  Recent Labs Lab 08/20/15 2231  AST 87*  ALT 73*  ALKPHOS 57  BILITOT 1.9*  PROT 7.0  ALBUMIN 3.4*    Recent Labs Lab 08/20/15 2231  LIPASE 21   CBG:  Recent Labs Lab 08/20/15 2229 08/21/15 0059  GLUCAP 252* 135*     Radiological Exams on Admission:  Ct Head Code Stroke Wo Contrast  08/21/2015  CLINICAL DATA:  40 year old male with right-sided facial droop and left sided weakness. EXAM: CT HEAD WITHOUT CONTRAST TECHNIQUE: Contiguous axial images were obtained from the base of the skull through the vertex without intravenous contrast. COMPARISON:  Head CT dated 04/11/2015 FINDINGS: The ventricles and the sulci are appropriate in size for the patient's age. There is no intracranial hemorrhage. No midline shift or mass effect identified. The gray-white matter differentiation is preserved. There is mild apparent invasion of the MCAs bilaterally, likely related to hemoconcentration. There is slight asymmetric decreased conspicuity of the M2  and M3 segments of the right MCA on the sagittal images. The more proximal right MCA thrombus is not excluded. CT angiography or MRI may provide better evaluation. The visualized paranasal sinuses and mastoid air cells are  well aerated. The calvarium is intact. IMPRESSION: No acute intracranial hemorrhage. Slight asymmetric decreased visualization of the distal segments of the right MCA on the sagittal images. This may be artifactual and related to patient position in the scanner. A right MCA occlusion is not excluded. CT angiography or MRI is recommended if there is high clinical concern for right MCA territory ischemia. These results were called by telephone at the time of interpretation on 08/21/2015 at 12:58 am to Dr. Roseanne Reno who verbally acknowledged these results. Electronically Signed   By: Elgie Collard M.D.   On: 08/21/2015 00:59     EKG: Independently reviewed. Rate 93, QTc 433, sinus, no ST changes.    Assessment/Plan 1. Left sided weakness:  This is not a new symptom for the patient, although it has returned acutely, he claims.  His CT findings of MCA territory infarct are not consistent with his exam of limited left sided weakness and diplopia.   -MRI brain ordered -If positive, will work up for secondary causes of stroke and admit for PT -If negative, appropriate for discharge home   2. Vomiting:  Also not new.  Recent upper endoscopy and HIDA negative.  Possible viral gastroenteritis superimposed on chronic functional abdominal syndrome, versus malingering.   -Continue ondansetron  3. HTN:  -Continue home antihypertensives  4. NIDDM:  -Continue home regimen         Alberteen Sam [facility] Pager [phone]

## 2015-08-21 NOTE — ED Notes (Addendum)
This RN went to introduce self to pt and pt was not in room. IV was on the floor and still attached to IV line. All of the leads were off the pt and scattered around the floor. Pt has left AMA.

## 2015-08-21 NOTE — ED Notes (Signed)
No TPA per Dr Roseanne RenoStewart.  Patient unable to have MRI due to weight and gerth.  Stated last time he had an MRI he had to go to [facility] and have it done in the open MRI

## 2015-08-21 NOTE — Consult Note (Signed)
Admission H&P    Chief Complaint: Dizziness, diplopia and left-sided weakness and numbness.  HPI: Bryan Gallegos is an 40 y.o. male with a history of hypertension, diabetes mellitus, hyperlipidemia, bipolar affective disorder, morbid obesity and sleep apnea, who initially presented to the emergency room at [facility] with a complaint of abdominal pain, nausea and vomiting and blood in his stool. At some point during the ED stay patient mentioned that he has been experiencing numbness involving his left side which started around 7:00 PM. He also complained of dizziness and diplopia. Diplopia was present only with use of his left eye. Patient subsequently started to complain of weakness of his left extremities and headache which were not present during the first 3-4 hours of being in the ED. Code stroke was activated and patient was transferred to [facility] ED for further management. CT scan of his head showed no acute intracranial abnormality. Distal segments of right MCA were not visualized well on sagittal sections which was thought to possibly be artifactual. MRI of the brain is been ordered and is pending.  Past Medical History  Diagnosis Date  . Heart valve disorder     s/p echocardiogram  . Hypertension   . Asthma   . Diabetes mellitus   . Hyperlipidemia   . Anaphylactic reaction   . Bipolar disorder ([place])   . Adult ADHD   . Morbidly obese ([place])   . Transient cerebral ischemia     Unknown  . OSA (obstructive sleep apnea)   . Anxiety   . Complication of anesthesia     Per patient difficult intubation;  . Difficult intubation     Per patient    Past Surgical History  Procedure Laterality Date  . Carpal tunnel release    . Nose surgery    . Wisdom tooth extraction    . Tooth extraction    . Esophagogastroduodenoscopy N/A 04/05/2015    Procedure: ESOPHAGOGASTRODUODENOSCOPY (EGD);  Surgeon: Rogene Houston, MD;  [facility]: [facility];  Service: Endoscopy;  Laterality: N/A;   . Esophagogastroduodenoscopy (egd) with propofol N/A 05/21/2015    Procedure: ESOPHAGOGASTRODUODENOSCOPY (EGD) WITH PROPOFOL;  Surgeon: Ladene Artist, MD;  Location: [facility] ENDOSCOPY;  Service: Endoscopy;  Laterality: N/A;    Family History  Problem Relation Age of Onset  . CAD Mother     Living  . Diabetes Mellitus II Mother   . Stroke Mother   . Hypertension Mother   . Congestive Heart Failure Mother   . Kidney disease Mother   . Fibromyalgia Mother   . Thyroid disease Mother   . Hyperlipidemia Mother   . Liver disease Mother   . Alcoholism Father 77    Deceased  . Arthritis Maternal Grandmother   . Congestive Heart Failure Maternal Grandmother   . Hypertension Maternal Grandmother   . Lung cancer Maternal Grandfather   . Colon cancer Maternal Aunt   . Stomach cancer Maternal Aunt   . Heart disease Other     Paternal & Maternal  . Hypertension Other     Paternal & Maternal  . Hypertension Brother     x3  . Hypertension Sister     #1  . Bipolar disorder Sister     #1  . ADD / ADHD Son     x3  . Bipolar disorder Son     x3  . Asperger's syndrome Son    Social History:  reports that he has been smoking Cigarettes.  He has a  23 pack-year smoking history. He has never used smokeless tobacco. He reports that he drinks alcohol. He reports that he uses illicit drugs (Benzodiazepines and Heroin).  Allergies:  Allergies  Allergen Reactions  . Atenolol Anaphylaxis  . Other Anaphylaxis    "Prednisone like drugs"  . Tylenol [Acetaminophen] Other (See Comments)    GI Bleed    Medications: Preadmission medications were reviewed by me.  ROS: Negative except for GI complaints and neurologic complaints as indicated in present illness.  Physical Examination: Blood pressure 144/88, pulse 93, temperature 98.6 F (37 C), temperature source Oral, resp. rate 15, height _0  (1.88 m), weight 170.099 kg (375 lb), SpO2 97 %.  HEENT-  Normocephalic, no lesions, without obvious  abnormality.  Normal external eye and conjunctiva.  Normal TM's bilaterally.  Normal auditory canals and external ears. Normal external nose, mucus membranes and septum.  Normal pharynx. Neck supple with no masses, nodes, nodules or enlargement. Cardiovascular - regular rate and rhythm, S1, S2 normal, no murmur, click, rub or gallop Lungs - chest clear, no wheezing, rales, normal symmetric air entry Abdomen - diffuse mild-to-moderate abdominal tenderness with normal bowel sounds Extremities - no joint deformities, effusion, or inflammation  Neurologic Examination: Mental Status: Alert, oriented, thought content appropriate.  Speech fluent without evidence of aphasia.  Cranial Nerves: II-Visual fields were normal. III/IV/VI-Pupils were equal and reacted normally to light. Extraocular movements were full and conjugate.    V/VII-subjective numbness on left side of the face; no facial weakness. VIII-normal. X-normal speech. XI: trapezius strength/neck flexion strength normal bilaterally XII-midline tongue extension with normal strength. Motor: Poor effort with motor exam; no objective signs of left upper nor lower extremity weakness. Sensory: Reduced perception tactile sensation over left extremities compared to right extremities. Deep Tendon Reflexes: Absent throughout. Plantars: Mute bilaterally  Results for orders placed or performed during the hospital encounter of 08/20/15 (from the past 48 hour(s))  CBG monitoring, ED     Status: Abnormal   Collection Time: 08/20/15 10:29 PM  Result Value Ref Range   Glucose-Capillary 252 (H) 65 - 99 mg/dL  Lipase, blood     Status: None   Collection Time: 08/20/15 10:31 PM  Result Value Ref Range   Lipase 21 11 - 51 U/L  Comprehensive metabolic panel     Status: Abnormal   Collection Time: 08/20/15 10:31 PM  Result Value Ref Range   Sodium 134 (L) 135 - 145 mmol/L   Potassium 5.5 (H) 3.5 - 5.1 mmol/L   Chloride 99 (L) 101 - 111 mmol/L   CO2  28 22 - 32 mmol/L   Glucose, Bld 223 (H) 65 - 99 mg/dL   BUN 8 6 - 20 mg/dL   Creatinine, Ser 0.93 0.61 - 1.24 mg/dL   Calcium 8.6 (L) 8.9 - 10.3 mg/dL   Total Protein 7.0 6.5 - 8.1 g/dL   Albumin 3.4 (L) 3.5 - 5.0 g/dL   AST 87 (H) 15 - 41 U/L   ALT 73 (H) 17 - 63 U/L   Alkaline Phosphatase 57 38 - 126 U/L   Total Bilirubin 1.9 (H) 0.3 - 1.2 mg/dL   GFR calc non Af Amer >60 >60 mL/min   GFR calc Af Amer >60 >60 mL/min    Comment: (NOTE) The eGFR has been calculated using the CKD EPI equation. This calculation has not been validated in all clinical situations. eGFR's persistently <60 mL/min signify possible Chronic Kidney Disease.    Anion gap 7 5 - 15  CBC     Status: None   Collection Time: 08/20/15 10:31 PM  Result Value Ref Range   WBC 9.2 4.0 - 10.5 K/uL   RBC 4.77 4.22 - 5.81 MIL/uL   Hemoglobin 14.7 13.0 - 17.0 g/dL   HCT 43.9 39.0 - 52.0 %   MCV 92.0 78.0 - 100.0 fL   MCH 30.8 26.0 - 34.0 pg   MCHC 33.5 30.0 - 36.0 g/dL   RDW 12.9 11.5 - 15.5 %   Platelets 209 150 - 400 K/uL  Urinalysis, Routine w reflex microscopic     Status: None   Collection Time: 08/20/15 11:20 PM  Result Value Ref Range   Color, Urine YELLOW YELLOW   APPearance CLEAR CLEAR   Specific Gravity, Urine 1.023 1.005 - 1.030   pH 6.5 5.0 - 8.0   Glucose, UA NEGATIVE NEGATIVE mg/dL   Hgb urine dipstick NEGATIVE NEGATIVE   Bilirubin Urine NEGATIVE NEGATIVE   Ketones, ur NEGATIVE NEGATIVE mg/dL   Protein, ur NEGATIVE NEGATIVE mg/dL   Nitrite NEGATIVE NEGATIVE   Leukocytes, UA NEGATIVE NEGATIVE    Comment: MICROSCOPIC NOT DONE ON URINES WITH NEGATIVE PROTEIN, BLOOD, LEUKOCYTES, NITRITE, OR GLUCOSE <1000 mg/dL.  Urine rapid drug screen (hosp performed)not at [facility]     Status: Abnormal   Collection Time: 08/21/15 12:21 AM  Result Value Ref Range   Opiates POSITIVE (A) NONE DETECTED   Cocaine NONE DETECTED NONE DETECTED   Benzodiazepines POSITIVE (A) NONE DETECTED   Amphetamines NONE DETECTED NONE  DETECTED   Tetrahydrocannabinol NONE DETECTED NONE DETECTED   Barbiturates NONE DETECTED NONE DETECTED    Comment:        DRUG SCREEN FOR MEDICAL PURPOSES ONLY.  IF CONFIRMATION IS NEEDED FOR ANY PURPOSE, NOTIFY LAB WITHIN 5 DAYS.        LOWEST DETECTABLE LIMITS FOR URINE DRUG SCREEN Drug Class       Cutoff (ng/mL) Amphetamine      1000 Barbiturate      200 Benzodiazepine   409 Tricyclics       811 Opiates          300 Cocaine          300 THC              50   CBG monitoring, ED     Status: Abnormal   Collection Time: 08/21/15 12:59 AM  Result Value Ref Range   Glucose-Capillary 135 (H) 65 - 99 mg/dL   Ct Head Code Stroke Wo Contrast  08/21/2015  CLINICAL DATA:  40 year old male with right-sided facial droop and left sided weakness. EXAM: CT HEAD WITHOUT CONTRAST TECHNIQUE: Contiguous axial images were obtained from the base of the skull through the vertex without intravenous contrast. COMPARISON:  Head CT dated 04/11/2015 FINDINGS: The ventricles and the sulci are appropriate in size for the patient's age. There is no intracranial hemorrhage. No midline shift or mass effect identified. The gray-white matter differentiation is preserved. There is mild apparent invasion of the MCAs bilaterally, likely related to hemoconcentration. There is slight asymmetric decreased conspicuity of the M2 and M3 segments of the right MCA on the sagittal images. The more proximal right MCA thrombus is not excluded. CT angiography or MRI may provide better evaluation. The visualized paranasal sinuses and mastoid air cells are well aerated. The calvarium is intact. IMPRESSION: No acute intracranial hemorrhage. Slight asymmetric decreased visualization of the distal segments of the right MCA on the sagittal images. This may be artifactual  and related to patient position in the scanner. A right MCA occlusion is not excluded. CT angiography or MRI is recommended if there is high clinical concern for right MCA  territory ischemia. These results were called by telephone at the time of interpretation on 08/21/2015 at 12:58 am to Dr. Nicole Kindred who verbally acknowledged these results. Electronically Signed   By: Anner Crete M.D.   On: 08/21/2015 00:59    Assessment/Plan 40 year old man presenting with multiple complaints, including acute weakness and numbness of left extremities, with no objective signs of an acute neurologic deficit. Patient appears to have significant psychophysiologic factors contributing to his presenting symptomatology.  Recommendations: 1. MRI of the brain without contrast 2. Stroke workup if MRI shows indications of an acute ischemic infarction, otherwise, no further neurological intervention is indicated. 3. Continue aspirin daily  C.R. Nicole Kindred, MD [facility] [phone]  08/21/2015, 1:38 AM

## 2015-08-21 NOTE — ED Provider Notes (Signed)
9:00 AM Spoke with MRI, attempted to place in the machine. Pt complained of tightness and not being able to breathe because of tightness. Unable to proceed with MRI. Will page medicine.   9:31 AM Pt eloped. No where to be found in Dept. Took his IV out.   Jaynie Crumbleatyana Marinna Blane, PA-C 08/21/15 1511  Rolland PorterMark James, MD 08/28/15 [phone]

## 2015-09-03 ENCOUNTER — Encounter (HOSPITAL_COMMUNITY): Payer: Self-pay | Admitting: Emergency Medicine

## 2015-09-03 ENCOUNTER — Emergency Department (HOSPITAL_COMMUNITY)
Admission: EM | Admit: 2015-09-03 | Discharge: 2015-09-04 | Disposition: A | Discharge status: Other Acute Inpatient Hospital | Payer: Medicare Other | Attending: Emergency Medicine | Admitting: Emergency Medicine

## 2015-09-03 DIAGNOSIS — R740 Nonspecific elevation of levels of transaminase and lactic acid dehydrogenase [LDH]: Secondary | ICD-10-CM | POA: Insufficient documentation

## 2015-09-03 DIAGNOSIS — E119 Type 2 diabetes mellitus without complications: Secondary | ICD-10-CM | POA: Diagnosis not present

## 2015-09-03 DIAGNOSIS — F1120 Opioid dependence, uncomplicated: Secondary | ICD-10-CM | POA: Diagnosis present

## 2015-09-03 DIAGNOSIS — Z79899 Other long term (current) drug therapy: Secondary | ICD-10-CM | POA: Insufficient documentation

## 2015-09-03 DIAGNOSIS — F1920 Other psychoactive substance dependence, uncomplicated: Secondary | ICD-10-CM | POA: Diagnosis not present

## 2015-09-03 DIAGNOSIS — J45909 Unspecified asthma, uncomplicated: Secondary | ICD-10-CM | POA: Diagnosis not present

## 2015-09-03 DIAGNOSIS — E785 Hyperlipidemia, unspecified: Secondary | ICD-10-CM | POA: Diagnosis not present

## 2015-09-03 DIAGNOSIS — F332 Major depressive disorder, recurrent severe without psychotic features: Secondary | ICD-10-CM | POA: Insufficient documentation

## 2015-09-03 DIAGNOSIS — F17210 Nicotine dependence, cigarettes, uncomplicated: Secondary | ICD-10-CM | POA: Diagnosis not present

## 2015-09-03 DIAGNOSIS — R45851 Suicidal ideations: Secondary | ICD-10-CM | POA: Insufficient documentation

## 2015-09-03 DIAGNOSIS — F331 Major depressive disorder, recurrent, moderate: Secondary | ICD-10-CM | POA: Diagnosis present

## 2015-09-03 DIAGNOSIS — I10 Essential (primary) hypertension: Secondary | ICD-10-CM | POA: Diagnosis not present

## 2015-09-03 DIAGNOSIS — Z7984 Long term (current) use of oral hypoglycemic drugs: Secondary | ICD-10-CM | POA: Insufficient documentation

## 2015-09-03 DIAGNOSIS — R7401 Elevation of levels of liver transaminase levels: Secondary | ICD-10-CM

## 2015-09-03 LAB — CBC
HCT: 46.8 % (ref 39.0–52.0)
Hemoglobin: 15.9 g/dL (ref 13.0–17.0)
MCH: 31.2 pg (ref 26.0–34.0)
MCHC: 34 g/dL (ref 30.0–36.0)
MCV: 91.9 fL (ref 78.0–100.0)
PLATELETS: 246 10*3/uL (ref 150–400)
RBC: 5.09 MIL/uL (ref 4.22–5.81)
RDW: 13 % (ref 11.5–15.5)
WBC: 12.2 10*3/uL — ABNORMAL HIGH (ref 4.0–10.5)

## 2015-09-03 LAB — RAPID URINE DRUG SCREEN, HOSP PERFORMED
Amphetamines: NOT DETECTED
BARBITURATES: NOT DETECTED
Benzodiazepines: NOT DETECTED
Cocaine: NOT DETECTED
Opiates: POSITIVE — AB
Tetrahydrocannabinol: NOT DETECTED

## 2015-09-03 NOTE — ED Notes (Signed)
Walked patient to restroom.

## 2015-09-03 NOTE — ED Notes (Signed)
Pt states he is "crazy".  States his aunt is in the ICU and it is taking a toll on him and his grandmother and he is very close to both of them.  Admits to SI and states "I have been known to want to hurt others when I get into this state of mind".  Pt entered hospital with no shoes on. States he has had auditory hallucinations but denies visual hallucinations.

## 2015-09-03 NOTE — ED Notes (Signed)
Wanded by security.  Girlfriend with patient, wanded by security also.

## 2015-09-04 ENCOUNTER — Encounter (HOSPITAL_COMMUNITY): Payer: Self-pay | Admitting: *Deleted

## 2015-09-04 ENCOUNTER — Inpatient Hospital Stay (HOSPITAL_COMMUNITY)
Admission: AD | Admit: 2015-09-04 | Discharge: 2015-09-05 | DRG: 885 | Disposition: A | Discharge status: Home / Self Care | Payer: Medicare Other | Source: Intra-hospital | Attending: Psychiatry | Admitting: Psychiatry

## 2015-09-04 DIAGNOSIS — F1120 Opioid dependence, uncomplicated: Secondary | ICD-10-CM | POA: Diagnosis not present

## 2015-09-04 DIAGNOSIS — F331 Major depressive disorder, recurrent, moderate: Secondary | ICD-10-CM | POA: Diagnosis present

## 2015-09-04 DIAGNOSIS — M549 Dorsalgia, unspecified: Secondary | ICD-10-CM | POA: Diagnosis present

## 2015-09-04 DIAGNOSIS — Z915 Personal history of self-harm: Secondary | ICD-10-CM

## 2015-09-04 DIAGNOSIS — R45851 Suicidal ideations: Secondary | ICD-10-CM | POA: Diagnosis present

## 2015-09-04 DIAGNOSIS — F17210 Nicotine dependence, cigarettes, uncomplicated: Secondary | ICD-10-CM | POA: Diagnosis present

## 2015-09-04 DIAGNOSIS — Z590 Homelessness: Secondary | ICD-10-CM

## 2015-09-04 DIAGNOSIS — F1320 Sedative, hypnotic or anxiolytic dependence, uncomplicated: Secondary | ICD-10-CM | POA: Diagnosis not present

## 2015-09-04 DIAGNOSIS — F332 Major depressive disorder, recurrent severe without psychotic features: Secondary | ICD-10-CM

## 2015-09-04 DIAGNOSIS — F1123 Opioid dependence with withdrawal: Secondary | ICD-10-CM | POA: Diagnosis present

## 2015-09-04 DIAGNOSIS — Z8673 Personal history of transient ischemic attack (TIA), and cerebral infarction without residual deficits: Secondary | ICD-10-CM

## 2015-09-04 DIAGNOSIS — E119 Type 2 diabetes mellitus without complications: Secondary | ICD-10-CM | POA: Diagnosis present

## 2015-09-04 DIAGNOSIS — J4530 Mild persistent asthma, uncomplicated: Secondary | ICD-10-CM

## 2015-09-04 DIAGNOSIS — F1920 Other psychoactive substance dependence, uncomplicated: Secondary | ICD-10-CM | POA: Diagnosis not present

## 2015-09-04 DIAGNOSIS — G8929 Other chronic pain: Secondary | ICD-10-CM | POA: Diagnosis present

## 2015-09-04 LAB — COMPREHENSIVE METABOLIC PANEL
ALBUMIN: 4.1 g/dL (ref 3.5–5.0)
ALK PHOS: 73 U/L (ref 38–126)
ALT: 74 U/L — ABNORMAL HIGH (ref 17–63)
ANION GAP: 8 (ref 5–15)
AST: 62 U/L — ABNORMAL HIGH (ref 15–41)
BILIRUBIN TOTAL: 1 mg/dL (ref 0.3–1.2)
BUN: 13 mg/dL (ref 6–20)
CALCIUM: 9.3 mg/dL (ref 8.9–10.3)
CO2: 26 mmol/L (ref 22–32)
CREATININE: 0.86 mg/dL (ref 0.61–1.24)
Chloride: 102 mmol/L (ref 101–111)
GFR calc non Af Amer: >60 mL/min (ref >60)
GLUCOSE: 137 mg/dL — AB (ref 65–99)
Potassium: 4.5 mmol/L (ref 3.5–5.1)
Sodium: 136 mmol/L (ref 135–145)
TOTAL PROTEIN: 8.8 g/dL — AB (ref 6.5–8.1)

## 2015-09-04 LAB — CBG MONITORING, ED
GLUCOSE-CAPILLARY: 142 mg/dL — AB (ref 65–99)
Glucose-Capillary: 141 mg/dL — ABNORMAL HIGH (ref 65–99)

## 2015-09-04 LAB — ETHANOL: Alcohol, Ethyl (B): <5 mg/dL (ref <5)

## 2015-09-04 LAB — ACETAMINOPHEN LEVEL

## 2015-09-04 LAB — SALICYLATE LEVEL: Salicylate Lvl: <4 mg/dL (ref 2.8–30.0)

## 2015-09-04 MED ORDER — ONDANSETRON HCL 4 MG PO TABS: 4.0000 mg | ORAL_TABLET | Freq: Three times a day (TID) | ORAL | Status: DC | PRN

## 2015-09-04 MED ORDER — HYDROXYZINE HCL 25 MG PO TABS
25.0000 mg | ORAL_TABLET | Freq: Four times a day (QID) | ORAL | Status: DC | PRN
  Administered 2015-09-04: 25 mg via ORAL
  Filled 2015-09-04: qty 1

## 2015-09-04 MED ORDER — ONDANSETRON 4 MG PO TBDP
4.0000 mg | ORAL_TABLET | Freq: Four times a day (QID) | ORAL | Status: DC | PRN
  Administered 2015-09-04: 4 mg via ORAL
  Filled 2015-09-04: qty 1

## 2015-09-04 MED ORDER — ZOLPIDEM TARTRATE 5 MG PO TABS: 5.0000 mg | ORAL_TABLET | Freq: Every evening | ORAL | Status: DC | PRN

## 2015-09-04 MED ORDER — AMPHETAMINE-DEXTROAMPHETAMINE 20 MG PO TABS: 60.0000 mg | ORAL_TABLET | Freq: Two times a day (BID) | ORAL | Status: DC

## 2015-09-04 MED ORDER — CITALOPRAM HYDROBROMIDE 10 MG PO TABS
10.0000 mg | ORAL_TABLET | Freq: Every day | ORAL | Status: DC
  Administered 2015-09-04: 10 mg via ORAL
  Filled 2015-09-04: qty 1

## 2015-09-04 MED ORDER — GABAPENTIN 400 MG PO CAPS
800.0000 mg | ORAL_CAPSULE | Freq: Three times a day (TID) | ORAL | Status: DC
  Administered 2015-09-04 – 2015-09-05 (×2): 800 mg via ORAL
  Filled 2015-09-04 (×8): qty 2

## 2015-09-04 MED ORDER — TRAZODONE HCL 100 MG PO TABS: 100.0000 mg | ORAL_TABLET | Freq: Every day | ORAL | Status: DC

## 2015-09-04 MED ORDER — ALBUTEROL SULFATE HFA 108 (90 BASE) MCG/ACT IN AERS: 2.0000 | INHALATION_SPRAY | Freq: Four times a day (QID) | RESPIRATORY_TRACT | Status: DC | PRN

## 2015-09-04 MED ORDER — HYDROXYZINE HCL 25 MG PO TABS: 25.0000 mg | ORAL_TABLET | Freq: Four times a day (QID) | ORAL | Status: DC | PRN

## 2015-09-04 MED ORDER — THIAMINE HCL 100 MG/ML IJ SOLN: 100.0000 mg | Freq: Once | INTRAMUSCULAR | Status: DC

## 2015-09-04 MED ORDER — FUROSEMIDE 20 MG PO TABS
20.0000 mg | ORAL_TABLET | Freq: Every day | ORAL | Status: DC
  Filled 2015-09-04: qty 1

## 2015-09-04 MED ORDER — FUROSEMIDE 20 MG PO TABS
20.0000 mg | ORAL_TABLET | Freq: Every day | ORAL | Status: DC
  Administered 2015-09-05: 20 mg via ORAL
  Filled 2015-09-04 (×3): qty 1

## 2015-09-04 MED ORDER — ONDANSETRON 4 MG PO TBDP: 4.0000 mg | ORAL_TABLET | Freq: Four times a day (QID) | ORAL | Status: DC | PRN

## 2015-09-04 MED ORDER — IBUPROFEN 600 MG PO TABS
600.0000 mg | ORAL_TABLET | Freq: Three times a day (TID) | ORAL | Status: DC | PRN
  Administered 2015-09-04: 600 mg via ORAL
  Filled 2015-09-04: qty 1

## 2015-09-04 MED ORDER — NICOTINE 7 MG/24HR TD PT24
7.0000 mg | MEDICATED_PATCH | Freq: Every day | TRANSDERMAL | Status: DC
  Filled 2015-09-04: qty 1

## 2015-09-04 MED ORDER — LORAZEPAM 0.5 MG PO TABS
0.5000 mg | ORAL_TABLET | Freq: Four times a day (QID) | ORAL | Status: DC | PRN
  Administered 2015-09-04 – 2015-09-05 (×2): 0.5 mg via ORAL

## 2015-09-04 MED ORDER — CITALOPRAM HYDROBROMIDE 10 MG PO TABS
10.0000 mg | ORAL_TABLET | Freq: Every day | ORAL | Status: DC
  Administered 2015-09-05: 10 mg via ORAL
  Filled 2015-09-04 (×3): qty 1

## 2015-09-04 MED ORDER — MAGNESIUM HYDROXIDE 400 MG/5ML PO SUSP: 30.0000 mL | Freq: Every day | ORAL | Status: DC | PRN

## 2015-09-04 MED ORDER — INSULIN ASPART 100 UNIT/ML ~~LOC~~ SOLN
0.0000 [IU] | Freq: Three times a day (TID) | SUBCUTANEOUS | Status: DC
  Administered 2015-09-05: 2 [IU] via SUBCUTANEOUS

## 2015-09-04 MED ORDER — NICOTINE 7 MG/24HR TD PT24
7.0000 mg | MEDICATED_PATCH | Freq: Every day | TRANSDERMAL | Status: DC
  Administered 2015-09-05: 7 mg via TRANSDERMAL
  Filled 2015-09-04 (×3): qty 1

## 2015-09-04 MED ORDER — CLONIDINE HCL 0.1 MG PO TABS
0.2000 mg | ORAL_TABLET | Freq: Two times a day (BID) | ORAL | Status: DC
  Administered 2015-09-04 (×2): 0.2 mg via ORAL
  Filled 2015-09-04 (×3): qty 2

## 2015-09-04 MED ORDER — ZOLPIDEM TARTRATE 10 MG PO TABS
10.0000 mg | ORAL_TABLET | Freq: Every day | ORAL | Status: DC
  Administered 2015-09-04: 10 mg via ORAL
  Filled 2015-09-04: qty 1

## 2015-09-04 MED ORDER — VITAMIN B-1 100 MG PO TABS
100.0000 mg | ORAL_TABLET | Freq: Every day | ORAL | Status: DC
  Administered 2015-09-05: 100 mg via ORAL
  Filled 2015-09-04 (×3): qty 1

## 2015-09-04 MED ORDER — PANTOPRAZOLE SODIUM 40 MG PO TBEC
40.0000 mg | DELAYED_RELEASE_TABLET | Freq: Every day | ORAL | Status: DC
  Administered 2015-09-04: 40 mg via ORAL
  Filled 2015-09-04: qty 1

## 2015-09-04 MED ORDER — LOPERAMIDE HCL 2 MG PO CAPS
2.0000 mg | ORAL_CAPSULE | ORAL | Status: DC | PRN
  Administered 2015-09-04: 4 mg via ORAL
  Filled 2015-09-04: qty 2

## 2015-09-04 MED ORDER — TRAZODONE HCL 100 MG PO TABS
100.0000 mg | ORAL_TABLET | Freq: Every day | ORAL | Status: DC
  Administered 2015-09-04: 100 mg via ORAL
  Filled 2015-09-04 (×2): qty 1

## 2015-09-04 MED ORDER — LORAZEPAM 1 MG PO TABS
1.0000 mg | ORAL_TABLET | Freq: Three times a day (TID) | ORAL | Status: DC | PRN
  Administered 2015-09-04: 1 mg via ORAL
  Filled 2015-09-04 (×2): qty 1

## 2015-09-04 MED ORDER — ALUM & MAG HYDROXIDE-SIMETH 200-200-20 MG/5ML PO SUSP: 30.0000 mL | ORAL | Status: DC | PRN

## 2015-09-04 MED ORDER — CLONIDINE HCL 0.2 MG PO TABS
0.2000 mg | ORAL_TABLET | Freq: Two times a day (BID) | ORAL | Status: DC
  Administered 2015-09-04 – 2015-09-05 (×2): 0.2 mg via ORAL
  Filled 2015-09-04 (×4): qty 1

## 2015-09-04 MED ORDER — LISINOPRIL 40 MG PO TABS
40.0000 mg | ORAL_TABLET | Freq: Two times a day (BID) | ORAL | Status: DC
  Administered 2015-09-04 – 2015-09-05 (×2): 40 mg via ORAL
  Filled 2015-09-04 (×6): qty 1

## 2015-09-04 MED ORDER — PANTOPRAZOLE SODIUM 40 MG PO TBEC
40.0000 mg | DELAYED_RELEASE_TABLET | Freq: Every day | ORAL | Status: DC
  Administered 2015-09-05: 40 mg via ORAL
  Filled 2015-09-04 (×3): qty 1

## 2015-09-04 MED ORDER — GABAPENTIN 400 MG PO CAPS
800.0000 mg | ORAL_CAPSULE | Freq: Three times a day (TID) | ORAL | Status: DC
  Administered 2015-09-04: 800 mg via ORAL
  Filled 2015-09-04: qty 2

## 2015-09-04 MED ORDER — LORAZEPAM 1 MG PO TABS
1.0000 mg | ORAL_TABLET | Freq: Four times a day (QID) | ORAL | Status: DC | PRN
Start: 2015-09-04 — End: 2015-09-05
  Filled 2015-09-04: qty 1

## 2015-09-04 MED ORDER — ADULT MULTIVITAMIN W/MINERALS CH
1.0000 | ORAL_TABLET | Freq: Every day | ORAL | Status: DC
  Administered 2015-09-05: 1 via ORAL
  Filled 2015-09-04 (×3): qty 1

## 2015-09-04 MED ORDER — AMPHETAMINE-DEXTROAMPHETAMINE 30 MG PO TABS: 2.0000 | ORAL_TABLET | Freq: Two times a day (BID) | ORAL | Status: DC

## 2015-09-04 MED ORDER — LISINOPRIL 40 MG PO TABS
40.0000 mg | ORAL_TABLET | Freq: Two times a day (BID) | ORAL | Status: DC
  Administered 2015-09-04: 40 mg via ORAL
  Filled 2015-09-04: qty 1

## 2015-09-04 MED ORDER — OXYCODONE HCL 5 MG PO TABS
30.0000 mg | ORAL_TABLET | Freq: Every day | ORAL | Status: DC
  Administered 2015-09-04 (×2): 30 mg via ORAL
  Filled 2015-09-04 (×2): qty 6

## 2015-09-04 MED ORDER — INSULIN ASPART 100 UNIT/ML ~~LOC~~ SOLN
0.0000 [IU] | Freq: Three times a day (TID) | SUBCUTANEOUS | Status: DC
  Administered 2015-09-04 (×2): 2 [IU] via SUBCUTANEOUS
  Filled 2015-09-04: qty 1

## 2015-09-04 MED ORDER — LORAZEPAM 0.5 MG PO TABS
ORAL_TABLET | ORAL | Status: AC
  Filled 2015-09-04: qty 1

## 2015-09-04 MED ORDER — ASPIRIN 325 MG PO TABS
325.0000 mg | ORAL_TABLET | Freq: Every day | ORAL | Status: DC
  Administered 2015-09-04: 325 mg via ORAL
  Filled 2015-09-04: qty 1

## 2015-09-04 MED ORDER — TRAZODONE HCL 100 MG PO TABS: 100.0000 mg | ORAL_TABLET | Freq: Every evening | ORAL | Status: DC | PRN

## 2015-09-04 MED ORDER — IBUPROFEN 200 MG PO TABS: 600.0000 mg | ORAL_TABLET | Freq: Three times a day (TID) | ORAL | Status: DC | PRN

## 2015-09-04 MED ORDER — METHOCARBAMOL 500 MG PO TABS: 500.0000 mg | ORAL_TABLET | Freq: Three times a day (TID) | ORAL | Status: DC | PRN

## 2015-09-04 MED ORDER — ASPIRIN 325 MG PO TABS
325.0000 mg | ORAL_TABLET | Freq: Every day | ORAL | Status: DC
  Administered 2015-09-05: 325 mg via ORAL
  Filled 2015-09-04 (×3): qty 1

## 2015-09-04 NOTE — Progress Notes (Addendum)
Patient ID: Hennie DuosMitchell L Pennisi, male   DOB: 03/16/1975, 40 y.o.   MRN: [id number]  Writer greeted patient shortly after patient arrived on the unit. Writer provided patient with a pillow, hygiene products, and towels. Patient was pleasant upon this interaction. Soon after this interaction, Clinical research associatewriter was informed patient would like to know what medications he is to take. Writer informed patient of medications scheduled for 1700. Patient became angry stating, "what am I getting for my withdrawals? Sweetie, I take my xanax's every day." Writer informed patient that although he is not on a protocol currently for benzodiazapine withdrawal (see UDS), he is receiving scheduled clonidine which does aid in opiate withdrawal. Patient walked away and was heard slamming his door to his room. MHT Rodney Web designerinformed writer at [id number] that patient stated that he is refusing his medications, CBG, and vitals until he speaks with an MD.

## 2015-09-04 NOTE — ED Notes (Signed)
Pt states that he has recently lost his disability. He lives with his girlfriend. He said that he hears voices in the background, but they are not command. He has thoughts of killing himself with no plan. Refused Lasix because it makes him urinate frequently.

## 2015-09-04 NOTE — Progress Notes (Signed)
Patient ID: Bryan Gallegos, male   DOB: 05/02/1975, 40 y.o.   MRN: [id number]  Patient came to writer before dinner and stated, "I need my meds." Writer informed patient that writer is happy to administer patient's scheduled medications but due to safety he will need his vital signs and CBG obtained. Patient said, "fuck this shit. I need my meds." Writer informed patient that the medications will not be administered because in order to administer them there needs to be a vital sign measurement and CBG obtained. Patient has still not given Clinical research associatewriter or MHT on hall permission to take vitals signs or retrieve CBG.

## 2015-09-04 NOTE — BH Assessment (Signed)
[facility] Assessment Progress Note  Per Thedore MinsMojeed Akintayo, MD, this pt requires psychiatric hospitalization at this time.  Berneice Heinrichina Tate, RN, [facility].  Pt has signed Voluntary Admission and Consent for Treatment, as well as Consent to Release Information to his probation officer, and signed [facility].  Pt's nurse, Diane, has been notified, and agrees to send original paperwork along with pt via [facility], and to call report to [phone].  Doylene Canninghomas Ahmani Daoud, MA Triage Specialist [phone]

## 2015-09-04 NOTE — Progress Notes (Addendum)
  D Pt. Denies SI and HI.  No complaints of pain or discomfort noted.  Pt. Has been asleep this evening until he got up for his HS medications.  A Writer offered support and encouragement.  R Pt. Came to the med window for his HS medications, rated his day a 9, his anxiety an 8 and his depression a 7. Pt. Refused to answer or respond to any further discussion with Clinical research associatewriter.  Threw his hands up in the air and went back to bed.    Pt. Got out of his bed and moved to the clean bed in the room where we were expecting a patient.  Staff then turned the light on in patients room in order to clean the bed he had been in for the new patient.  Pt. Started screaming from his bed because the light was on.  Will discuss with day shift if the pt. Should be a do not admit.  Pt. Remains safe on the unit.  Pt. Screamed out later d/t tech doing the rounds and opening his door.  Pt. Got up at 0416 requesting sleep medications after being in bed most of the day on 09/04/2015.  Pt. Then ask for anxiety medication rating his anxiety a 10.  Pt. Ask for his neurontin and was informed he would receive the next scheduled dose at 8am.  Writer explained it was scheduled TID 8am 12noon and 5pm.  Pt. Woke up later and told the tech he wanted to speak with Clinical research associatewriter again.  He started asking again why he could not have his neurontin and Clinical research associatewriter again explained the times to the pt.  Pt.became angry and started cursing loudly.  Writer told him if he was not pleased with the schedule he would have to take it up with his MD.  Pt. Continued cursing as Clinical research associatewriter closed the door.

## 2015-09-04 NOTE — ED Notes (Signed)
[facility]. No distress noted. All belongings returned to pt who signed for same.

## 2015-09-04 NOTE — ED Notes (Signed)
Patient pleasant, cooperative. Reports SI and AH. Denies HI. Rates feelings of anxiety and depression high. States that sleep and appetite have been interrupted in recent weeks. Patient reports "a lot of worry".  Encouragement offered. Oriented to the unit.   TTS present.  Q15 safety checks in place.

## 2015-09-04 NOTE — Progress Notes (Signed)
Patient ID: Bryan Gallegos, male   DOB: 01/29/1976, 40 y.o.   MRN: [id number]  PER STATE REGULATIONS 482.30  THIS CHART WAS REVIEWED FOR MEDICAL NECESSITY WITH RESPECT TO THE PATIENT'S ADMISSION/DURATION OF STAY.  NEXT REVIEW DATE:09/08/2015  Loura HaltBARBARA Cherisse Carrell, RN, BSN CASE MANAGER
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
## 2015-09-04 NOTE — Progress Notes (Signed)
[name] is a 40 yo caucasian  Male [facility] with cc of feeling suicidal and wanting to die. He is filthy and hasn't bathed in several days . HHe says he has been here before but that its been " years" since he was last here. According to ED nurse, pt has PMH of polysubstance abuse, hyperlipidemia, s/p TIA's, Bezodiazepine and alcohol addiction, sleep apnea with morbid obesity. With IDDM. He states his PCP is " in [place]"  But he can' remember the name. He says he takes xanax and oxycodone and gets them " where ever I can find.I go the cheapest place I can find". He says he has allergies to atenolol, tylenol and steroids. States his family and children " are very supportive"  And says, when asked by this nurse to identify what his stressors are, he says " my familyis sick..She complains of vaginal discharge for  days; described as itchy, white, non bloody, without pelvic pain or abnormal vaginal bleeding. Grandmother is sick and so is my mother". ED nurse reprots pt has been " on disability for 14 years and he lost it recently". Pt assissted to complete front page of Suicide Safety Plan and it is placed on his chart. Pt oriented to the unit and admission is completed and pt taken to the unit. Silver colored ring with stone in it locked in safe ( via AC TT) per pt request.

## 2015-09-04 NOTE — BH Assessment (Signed)
Assessment completed. Consulted Charles Kober, PA-C who recommended inpatient treatment.  

## 2015-09-04 NOTE — ED Provider Notes (Signed)
CSN: [id number]     Arrival date & time 09/03/15  2302 History  By signing my name below, I, Rosario AdieWilliam Andrew Hiatt, attest that this documentation has been prepared under the direction and in the presence of Dione Boozeavid Yale Golla, MD.  Electronically Signed: Rosario AdieWilliam Andrew Hiatt, ED Scribe. 09/04/2015. 12:06 AM.   Chief Complaint  Patient presents with  . Suicidal   The history is provided by the patient and medical records. No language interpreter was used.   HPI Comments: Bryan Gallegos is a 40 y.o. male with a PMHx significant for bipolar disorder, ADHD, anxiety, HTN, DM, and obesity who presents to the Emergency Department with gradual onset suicidal ideation the began 1 week PTA. Pt reports that he wanted to either OD or shoot himself with a gun. He does not have access to a gun currently. Pt reports that his Aunt is currently in ICU, and had two other Aunts pass away the same way in the past. Because of this it has caused him to become more depressed recently, and has had difficulty sleeping. He notes that "everything I do is wrong", and he has been borrowing money from other people to pay for his medications. He has previous history SI with attempt, and took 90 pills Monadine, 60-70 pills Lexapro, and used cocaine to try to kill himself at that time. He was recently committed into a psychiatric facility approximately 4 months ago for similar symptoms. He has been having auditory hallucinations, but denies any trying to harm him. He has been drinking alcohol and smoking 3/4ppd. He denies recent illicit drug usage. Pt denies visual hallucinations.   Past Medical History  Diagnosis Date  . Heart valve disorder     s/p echocardiogram  . Hypertension   . Asthma   . Diabetes mellitus   . Hyperlipidemia   . Anaphylactic reaction   . Bipolar disorder (HCC)   . Adult ADHD   . Morbidly obese (HCC)   . Transient cerebral ischemia     Unknown  . OSA (obstructive sleep apnea)   . Anxiety   .  Complication of anesthesia     Per patient difficult intubation;  . Difficult intubation     Per patient   Past Surgical History  Procedure Laterality Date  . Carpal tunnel release    . Nose surgery    . Wisdom tooth extraction    . Tooth extraction    . Esophagogastroduodenoscopy N/A 04/05/2015    Procedure: ESOPHAGOGASTRODUODENOSCOPY (EGD);  Surgeon: Malissa HippoNajeeb U Rehman, MD;  Location: [facility];  Service: Endoscopy;  Laterality: N/A;  . Esophagogastroduodenoscopy (egd) with propofol N/A 05/21/2015    Procedure: ESOPHAGOGASTRODUODENOSCOPY (EGD) WITH PROPOFOL;  Surgeon: Meryl DareMalcolm T Stark, MD;  Location: [facility];  Service: Endoscopy;  Laterality: N/A;   Family History  Problem Relation Age of Onset  . CAD Mother     Living  . Diabetes Mellitus II Mother   . Stroke Mother   . Hypertension Mother   . Congestive Heart Failure Mother   . Kidney disease Mother   . Fibromyalgia Mother   . Thyroid disease Mother   . Hyperlipidemia Mother   . Liver disease Mother   . Alcoholism Father 2335    Deceased  . Arthritis Maternal Grandmother   . Congestive Heart Failure Maternal Grandmother   . Hypertension Maternal Grandmother   . Lung cancer Maternal Grandfather   . Colon cancer Maternal Aunt   . Stomach cancer Maternal Aunt   . Heart  disease Other     Paternal & Maternal  . Crohn's disease Other   . Hypertension Other     Paternal & Maternal  . Hypertension Brother     x3  . Hypertension Sister     #1  . Bipolar disorder Sister     #1  . ADD / ADHD Son     x3  . Bipolar disorder Son     x3  . Asperger's syndrome Son    Social History  Substance Use Topics  . Smoking status: Current Every Day Smoker -- 0.50 packs/day for 23 years    Types: Cigarettes  . Smokeless tobacco: Never Used  . Alcohol Use: No    Review of Systems  Constitutional: Negative for fever.  Psychiatric/Behavioral: Positive for suicidal ideas, hallucinations (auditory), sleep disturbance and  dysphoric mood.  All other systems reviewed and are negative.  Allergies  Atenolol; Other; and Tylenol  Home Medications   Prior to Admission medications   Medication Sig Start Date End Date Taking? Authorizing Provider  albuterol (PROVENTIL HFA;VENTOLIN HFA) 108 (90 Base) MCG/ACT inhaler Inhale 2 puffs into the lungs every 6 (six) hours as needed for wheezing or shortness of breath. 04/11/15   Jimmy Footman, MD  alprazolam Prudy Feeler) 2 MG tablet Take 2 mg by mouth 3 (three) times daily as needed. 08/14/15   Historical Provider, MD  amphetamine-dextroamphetamine (ADDERALL) 30 MG tablet Take 2 tablets by mouth 2 (two) times daily. 08/14/15   Historical Provider, MD  aspirin 325 MG tablet Take 1 tablet (325 mg total) by mouth daily. 04/11/15   Jimmy Footman, MD  cloNIDine (CATAPRES) 0.2 MG tablet Take 0.2 mg by mouth 2 (two) times daily.    Historical Provider, MD  dexlansoprazole (DEXILANT) 60 MG capsule Take 60 mg by mouth daily.    Historical Provider, MD  furosemide (LASIX) 20 MG tablet Take 1 tablet (20 mg total) by mouth daily. 04/11/15   Jimmy Footman, MD  gabapentin (NEURONTIN) 400 MG capsule Take 2 capsules (800 mg total) by mouth 3 (three) times daily. 04/11/15   Jimmy Footman, MD  hydrOXYzine (VISTARIL) 50 MG capsule Take 1 capsule (50 mg total) by mouth 3 (three) times daily as needed for anxiety. Patient not taking: Reported on 08/20/2015 04/11/15   Jimmy Footman, MD  lisinopril (PRINIVIL,ZESTRIL) 40 MG tablet Take 1 tablet (40 mg total) by mouth 2 (two) times daily. 04/11/15   Jimmy Footman, MD  Naloxone HCl 4 MG/10ML SOLN Inject 4 mg as directed once as needed (in event of). Patient not taking: Reported on 08/20/2015 04/12/15   Hollice Espy, MD  ondansetron (ZOFRAN) 8 MG tablet Take 1 tablet (8 mg total) by mouth 4 (four) times daily -  before meals and at bedtime. 05/22/15   Joseph Art, DO  oxyCODONE (OXY  IR/ROXICODONE) 5 MG immediate release tablet Take 1 tablet (5 mg total) by mouth every 4 (four) hours as needed for severe pain. Patient not taking: Reported on 08/20/2015 07/15/15   Geoffery Lyons, MD  oxycodone (ROXICODONE) 30 MG immediate release tablet Take 30 mg by mouth 5 (five) times daily.    Historical Provider, MD  sitaGLIPtin-metformin (JANUMET) 50-1000 MG tablet Take 1 tablet by mouth 2 (two) times daily with a meal.    Historical Provider, MD  zolpidem (AMBIEN) 10 MG tablet Take 10 mg by mouth at bedtime.    Historical Provider, MD   BP 159/106 mmHg  Pulse 117  Temp(Src) 98.5 F (36.9 C) (  Oral)  Resp 19  Ht 6\' 2"  (1.88 m)  Wt 375 lb (170.099 kg)  BMI 48.13 kg/m2  SpO2 95%   Physical Exam  Constitutional: He is oriented to person, place, and time. He appears well-developed and well-nourished.  He is morbidly obese.  HENT:  Head: Normocephalic and atraumatic.  Eyes: Conjunctivae and EOM are normal. Pupils are equal, round, and reactive to light.  Neck: Normal range of motion. Neck supple. No JVD present.  Cardiovascular: Normal rate, regular rhythm and normal heart sounds.   No murmur heard. Pulmonary/Chest: Effort normal and breath sounds normal. He has no wheezes. He has no rales. He exhibits no tenderness.  Abdominal: Soft. Bowel sounds are normal. He exhibits no distension and no mass. There is no tenderness.  Musculoskeletal: Normal range of motion. He exhibits no edema.  Pt has 2+ pitting edema bilaterally.  Lymphadenopathy:    He has no cervical adenopathy.  Neurological: He is alert and oriented to person, place, and time. No cranial nerve deficit. He exhibits normal muscle tone. Coordination normal.  Skin: Skin is warm and dry. No rash noted.  Nursing note and vitals reviewed.  ED Course  Procedures (including critical care time)  DIAGNOSTIC STUDIES: Oxygen Saturation is 95% on RA, adequate by my interpretation.   COORDINATION OF CARE: 12:06 AM-Discussed  next steps with pt. Pt verbalized understanding and is agreeable with the plan.   Labs Review Results for orders placed or performed during the hospital encounter of 09/03/15  Comprehensive metabolic panel  Result Value Ref Range   Sodium 136 135 - 145 mmol/L   Potassium 4.5 3.5 - 5.1 mmol/L   Chloride 102 101 - 111 mmol/L   CO2 26 22 - 32 mmol/L   Glucose, Bld 137 (H) 65 - 99 mg/dL   BUN 13 6 - 20 mg/dL   Creatinine, Ser 4.090.86 0.61 - 1.24 mg/dL   Calcium 9.3 8.9 - 81.110.3 mg/dL   Total Protein 8.8 (H) 6.5 - 8.1 g/dL   Albumin 4.1 3.5 - 5.0 g/dL   AST 62 (H) 15 - 41 U/L   ALT 74 (H) 17 - 63 U/L   Alkaline Phosphatase 73 38 - 126 U/L   Total Bilirubin 1.0 0.3 - 1.2 mg/dL   GFR calc non Af Amer >60 >60 mL/min   GFR calc Af Amer >60 >60 mL/min   Anion gap 8 5 - 15  Ethanol  Result Value Ref Range   Alcohol, Ethyl (B) <5 <5 mg/dL  Salicylate level  Result Value Ref Range   Salicylate Lvl <4.0 2.8 - 30.0 mg/dL  Acetaminophen level  Result Value Ref Range   Acetaminophen (Tylenol), Serum <10 (L) 10 - 30 ug/mL  cbc  Result Value Ref Range   WBC 12.2 (H) 4.0 - 10.5 K/uL   RBC 5.09 4.22 - 5.81 MIL/uL   Hemoglobin 15.9 13.0 - 17.0 g/dL   HCT 91.446.8 78.239.0 - 95.652.0 %   MCV 91.9 78.0 - 100.0 fL   MCH 31.2 26.0 - 34.0 pg   MCHC 34.0 30.0 - 36.0 g/dL   RDW 21.313.0 08.611.5 - 57.815.5 %   Platelets 246 150 - 400 K/uL  Rapid urine drug screen (hospital performed)  Result Value Ref Range   Opiates POSITIVE (A) NONE DETECTED   Cocaine NONE DETECTED NONE DETECTED   Benzodiazepines NONE DETECTED NONE DETECTED   Amphetamines NONE DETECTED NONE DETECTED   Tetrahydrocannabinol NONE DETECTED NONE DETECTED   Barbiturates NONE DETECTED NONE DETECTED  I have personally reviewed and evaluated these lab results as part of my medical decision-making.   MDM   Final diagnoses:  Suicidal ideation  Severe episode of recurrent major depressive disorder, without psychotic features (HCC)  Elevated transaminase  level    Major depression with suicidal ideation and history of suicide attempt. Laboratory workup shows mild elevation of transaminases which is unchanged from baseline. He is admitted to the psychiatric holding unit for TTS evaluation. Review of old records shows admission to [facility] for psychiatric complaint in February of this year.  I personally performed the services described in this documentation, which was scribed in my presence. The recorded information has been reviewed and is accurate.     Dione Booze, MD 09/04/15 [phone]

## 2015-09-05 ENCOUNTER — Encounter (HOSPITAL_COMMUNITY): Payer: Self-pay | Admitting: Psychiatry

## 2015-09-05 LAB — GLUCOSE, CAPILLARY: Glucose-Capillary: 124 mg/dL — ABNORMAL HIGH (ref 65–99)

## 2015-09-05 MED ORDER — TRAZODONE HCL 50 MG PO TABS: 50.0000 mg | ORAL_TABLET | Freq: Every day | ORAL | Status: DC

## 2015-09-05 MED ORDER — CLONIDINE HCL 0.1 MG PO TABS
0.1000 mg | ORAL_TABLET | Freq: Four times a day (QID) | ORAL | Status: DC
  Filled 2015-09-05 (×4): qty 1

## 2015-09-05 MED ORDER — LORAZEPAM 1 MG PO TABS: 1.0000 mg | ORAL_TABLET | Freq: Every day | ORAL | Status: DC

## 2015-09-05 MED ORDER — ALBUTEROL SULFATE HFA 108 (90 BASE) MCG/ACT IN AERS: 2.0000 | INHALATION_SPRAY | Freq: Four times a day (QID) | RESPIRATORY_TRACT | Status: DC | PRN

## 2015-09-05 MED ORDER — TRAZODONE HCL 150 MG PO TABS: 150.0000 mg | ORAL_TABLET | Freq: Every evening | ORAL | Status: DC | PRN

## 2015-09-05 MED ORDER — CLONIDINE HCL 0.1 MG PO TABS: 0.1000 mg | ORAL_TABLET | Freq: Every day | ORAL | Status: DC

## 2015-09-05 MED ORDER — ONDANSETRON 4 MG PO TBDP: 4.0000 mg | ORAL_TABLET | Freq: Four times a day (QID) | ORAL | Status: DC | PRN

## 2015-09-05 MED ORDER — LOPERAMIDE HCL 2 MG PO CAPS: 2.0000 mg | ORAL_CAPSULE | ORAL | Status: DC | PRN

## 2015-09-05 MED ORDER — GABAPENTIN 400 MG PO CAPS: 800.0000 mg | ORAL_CAPSULE | Freq: Three times a day (TID) | ORAL | Status: DC

## 2015-09-05 MED ORDER — ONDANSETRON HCL 8 MG PO TABS: 8.0000 mg | ORAL_TABLET | Freq: Three times a day (TID) | ORAL | Status: DC

## 2015-09-05 MED ORDER — QUETIAPINE FUMARATE 25 MG PO TABS
25.0000 mg | ORAL_TABLET | Freq: Every day | ORAL | Status: DC
  Filled 2015-09-05 (×2): qty 1

## 2015-09-05 MED ORDER — NAPROXEN 500 MG PO TABS: 500.0000 mg | ORAL_TABLET | Freq: Two times a day (BID) | ORAL | Status: DC | PRN

## 2015-09-05 MED ORDER — SITAGLIPTIN PHOS-METFORMIN HCL 50-1000 MG PO TABS: 1.0000 | ORAL_TABLET | Freq: Two times a day (BID) | ORAL | Status: DC

## 2015-09-05 MED ORDER — INSULIN NPH (HUMAN) (ISOPHANE) 100 UNIT/ML ~~LOC~~ SUSP: 1.0000 [IU] | Freq: Every day | SUBCUTANEOUS | Status: DC

## 2015-09-05 MED ORDER — LORAZEPAM 1 MG PO TABS: 1.0000 mg | ORAL_TABLET | Freq: Four times a day (QID) | ORAL | Status: DC

## 2015-09-05 MED ORDER — CLONIDINE HCL 0.1 MG PO TABS
0.1000 mg | ORAL_TABLET | Freq: Four times a day (QID) | ORAL | Status: DC
  Filled 2015-09-05 (×8): qty 1

## 2015-09-05 MED ORDER — LORAZEPAM 1 MG PO TABS: 1.0000 mg | ORAL_TABLET | Freq: Two times a day (BID) | ORAL | Status: DC

## 2015-09-05 MED ORDER — CLONIDINE HCL 0.1 MG PO TABS: 0.1000 mg | ORAL_TABLET | ORAL | Status: DC

## 2015-09-05 MED ORDER — DICYCLOMINE HCL 20 MG PO TABS: 20.0000 mg | ORAL_TABLET | Freq: Four times a day (QID) | ORAL | Status: DC | PRN

## 2015-09-05 MED ORDER — FUROSEMIDE 20 MG PO TABS: 20.0000 mg | ORAL_TABLET | Freq: Every day | ORAL | Status: DC

## 2015-09-05 MED ORDER — QUETIAPINE FUMARATE 25 MG PO TABS: 25.0000 mg | ORAL_TABLET | Freq: Three times a day (TID) | ORAL | Status: DC | PRN

## 2015-09-05 MED ORDER — ASPIRIN 325 MG PO TABS: 325.0000 mg | ORAL_TABLET | Freq: Every day | ORAL | Status: DC

## 2015-09-05 MED ORDER — METHOCARBAMOL 500 MG PO TABS: 500.0000 mg | ORAL_TABLET | Freq: Three times a day (TID) | ORAL | Status: DC | PRN

## 2015-09-05 MED ORDER — LORAZEPAM 1 MG PO TABS: 1.0000 mg | ORAL_TABLET | Freq: Three times a day (TID) | ORAL | Status: DC

## 2015-09-05 MED ORDER — DEXLANSOPRAZOLE 60 MG PO CPDR: 60.0000 mg | DELAYED_RELEASE_CAPSULE | Freq: Every day | ORAL | Status: DC

## 2015-09-05 MED ORDER — CITALOPRAM HYDROBROMIDE 10 MG PO TABS: 10.0000 mg | ORAL_TABLET | Freq: Every day | ORAL | Status: DC

## 2015-09-05 MED ORDER — LISINOPRIL 40 MG PO TABS: 40.0000 mg | ORAL_TABLET | Freq: Two times a day (BID) | ORAL | Status: DC

## 2015-09-05 MED ORDER — HYDROXYZINE HCL 25 MG PO TABS: 25.0000 mg | ORAL_TABLET | Freq: Four times a day (QID) | ORAL | Status: DC | PRN

## 2015-09-05 MED ORDER — HYDROXYZINE PAMOATE 50 MG PO CAPS: 50.0000 mg | ORAL_CAPSULE | Freq: Three times a day (TID) | ORAL | Status: DC | PRN

## 2015-09-05 NOTE — [facility] Suicide Risk Assessment (Signed)
[facility] INPATIENT:  Family/Significant Other Suicide Prevention Education  Suicide Prevention Education:  Patient Refusal for Family/Significant Other Suicide Prevention Education: The patient Bryan Gallegos has refused to provide written consent for family/significant other to be provided Family/Significant Other Suicide Prevention Education during admission and/or prior to discharge.  Physician notified.  SPE completed with pt, as pt refused to consent to family contact. SPI pamphlet provided to pt and pt was encouraged to share information with support network, ask questions, and talk about any concerns relating to SPE. Pt denies access to guns/firearms and verbalized understanding of information provided. Mobile Crisis information also provided to pt.   Smart, Keithon Mccoin LCSW 09/05/2015, 1:05 PM

## 2015-09-05 NOTE — Progress Notes (Signed)
Bryan Gallegos is dc'd  home after MD has completed pt's DC order, DC SRA and DC AVS. Pt is given his DC  transition form, AVS and SSP ( front page only .he  did not want to complete 2nd page). He refused to coplete his daily assessment but he contracts with this nurse to not hurt self. ALl belongings are returned to pt and teaching is not completed by this writer due to pt's insistence to " just take me out,.Marland Kitchen.Marland Kitchen.Marland Kitchen.I wanna go now!!" . Pt is discharged per his request.
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
## 2015-09-05 NOTE — [facility] Suicide Risk Assessment (Addendum)
[facility]  Suicide [facility]   Nursing information obtained from:    Demographic factors:    Current Mental Status:    Loss Factors:    Historical Factors:    Risk Reduction Factors:     Total Time spent with patient: 30 minutes Principal Problem: Major depressive disorder, recurrent severe without psychotic features (HCC) Diagnosis:   Patient Active Problem List   Diagnosis Date Noted  . Major depressive disorder, recurrent severe without psychotic features (HCC) [F33.2] 09/04/2015  . Polysubstance dependence including opioid type drug, episodic abuse (HCC) [F11.20, F19.20] 09/04/2015  . Left-sided weakness [M62.89] 08/21/2015  . Vomiting [R11.10] 08/21/2015  . Epigastric pain [R10.13]   . RUQ abdominal pain [R10.11]   . Intractable vomiting [R11.10] 05/17/2015  . Essential hypertension [I10] 05/17/2015  . Abdominal pain [R10.9] 05/17/2015  . Cholelithiasis [K80.20] 05/17/2015  . 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 Antisocial personality disorder [F60.2] 04/11/2015  . Opioid use disorder, severe, dependence (HCC) [F11.20] 04/11/2015  . Benzodiazepine dependence (HCC) [F13.20] 04/11/2015  . Opioid overdose [T40.2X1A] 04/11/2015  . Overdose [T50.901A] 04/11/2015  . 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 Altered mental status [R41.82]   . Hypoxic brain injury (HCC) [G93.1]   . HTN (hypertension) [I10] 04/10/2015  . Tobacco use disorder [F17.200] 04/10/2015  . Narcotic dependency, continuous (HCC) [F11.20] 03/14/2014  . Chronic back pain greater than 3 months duration [M54.9, G89.29] 01/28/2014  . Gastroesophageal reflux disease with esophagitis [K21.0] 01/28/2014  . Essential hypertension, benign [I10] 01/28/2014  . Asthma, mild persistent [J45.30] 01/28/2014  . Diabetes mellitus type II, controlled (HCC) [E11.9] 01/28/2014  . Morbid obesity (HCC) [E66.01] 01/28/2014  . TIA (transient ischemic attack) [G45.9] 09/02/2013  .  HLD (hyperlipidemia) [E78.5] 08/06/2013  . OSA (obstructive sleep apnea) [G47.33] 08/06/2013   Subjective Data:  Pt initially reported that he was depressed , anxious and suicidal . However writer was called by NP requesting to re- evaluate pt. Pt at that time told writer that he was no longer suicidal since he received call from family that his aunt is out of the hospital and doing well. Pt reports that his aunt being sick was his stressor and that he is no longer suicidal since she is doing well.  Pt per previous EHR records is known to have Malingering - to get secondary gain. Pt advised to get help if he decompensates - he agrees .  Continued Clinical Symptoms:  Alcohol Use Disorder Identification Test Final Score (AUDIT): 8 The "Alcohol Use Disorders Identification Test", Guidelines for Use in Primary Care, Second Edition.  World Science writerHealth Organization [facility](WHO). Score between 0-7:  no or low risk or alcohol related problems. Score between 8-15:  moderate risk of alcohol related problems. Score between 16-19:  high risk of alcohol related problems. Score 20 or above:  warrants further diagnostic evaluation for alcohol dependence and treatment.   CLINICAL FACTORS:   Severe Anxiety and/or Agitation Alcohol/Substance Abuse/Dependencies Previous Psychiatric Diagnoses and Treatments Medical Diagnoses and Treatments/Surgeries   Musculoskeletal: Strength & Muscle Tone: within normal limits Gait & Station: normal Patient leans: N/A  Psychiatric Specialty Exam: Physical Exam  Nursing note and vitals reviewed. Neck: No thyromegaly present.    Review of Systems  Psychiatric/Behavioral: Positive for depression and substance abuse. The patient is nervous/anxious.   All other systems reviewed and are negative.   Blood pressure 136/72, pulse 94, temperature 97.5 F (36.4 C), temperature source Oral, resp. rate 21, height 6\' 2"  (1.88 m), weight  167.831 kg (370 lb), SpO2 100 %.Body mass index is  47.49 kg/(m^2).  General Appearance: Fairly Groomed  Eye Contact:  Fair  Speech:  Normal Rate  Volume:  Increased  Mood:  Anxious improved  Affect:  Appropriate  Thought Process:  Goal Directed and Descriptions of Associations: Circumstantial  Orientation:  Full (Time, Place, and Person)  Thought Content:  Rumination  Suicidal Thoughts:  No  Homicidal Thoughts:  No  Memory:  Immediate;   Fair Recent;   Fair Remote;   Fair  Judgement:  Impaired  Insight:  Shallow  Psychomotor Activity:  Increased and Restlessness improved  Concentration:  Concentration: Fair and Attention Span: Fair  Recall:  [employer]Fair  Fund of Knowledge:  Fair  Language:  Fair  Akathisia:  No  Handed:  Right  AIMS (if indicated):     Assets:  Social Support Others:  access to healthcare  ADL's:  Intact  Cognition:  WNL  Sleep:         COGNITIVE FEATURES THAT CONTRIBUTE TO RISK:  Closed-mindedness, Polarized thinking and Thought constriction (tunnel vision)    SUICIDE RISK: MINIMAL - PT DENIES CURRENT SI , reports his stressor is removed now that his aunt is discharged from hospital.Pt contracts for safety , agrees to get help if he decompensates. Pt with known hx of malingering to get his way.   Please see DC summary for further information.   Jahred Tatar, MD 09/05/2015, 11:34 AM

## 2015-09-05 NOTE — Tx Team (Signed)
Initial Interdisciplinary Treatment Plan   PATIENT STRESSORS: Educational concerns Financial difficulties Health problems Legal issue   PATIENT STRENGTHS: Ability for insight Active sense of humor Average or above average intelligence Capable of independent living   PROBLEM LIST: Problem List/Patient Goals Date to be addressed Date deferred Reason deferred Estimated date of resolution  Depression with Suicidal Ideation 09/04/2015     Polysubstance abuse 09/04/2015                   " I love my kids" 09/04/2015     " I love my wife" 09/04/2015                        DISCHARGE CRITERIA:  Ability to meet basic life and health needs Adequate post-discharge living arrangements Improved stabilization in mood, thinking, and/or behavior Medical problems require only outpatient monitoring  PRELIMINARY DISCHARGE PLAN: Attend aftercare/continuing care group Attend PHP/IOP Attend 12-step recovery group Outpatient therapy  PATIENT/FAMIILY INVOLVEMENT: This treatment plan has been presented to and reviewed with the patient, Hennie DuosMitchell L Plotkin, and/or family member, .  The patient and family have been given the opportunity to ask questions and make suggestions.  Rich BraveDuke, Braydee Shimkus Lynn 09/05/2015, 6:56 PM
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
## 2015-09-05 NOTE — Progress Notes (Signed)
  [facility]  Will you be returning to the same living situation after discharge:  Yes,  home At discharge, do you have transportation home?: Yes,  bus? Do you have the ability to pay for your medications: Yes,  Medicare  Release of information consent forms completed and submitted to medical records by CSW.  Patient to Follow up at: Follow-up Information    Follow up with Patient declined follow-up services. .      Next level of care provider has access to [facility]: n/a   Safety Planning and Suicide Prevention discussed: Yes,  SPE completed with pt; pt declined collateral contact/family contact.  Have you used any form of tobacco in the last 30 days? (Cigarettes, Smokeless Tobacco, Cigars, and/or Pipes): No  Has patient been referred to the Quitline?: N/A patient is not a smoker  Patient has been referred for addiction treatment: Pt. refused referral  Smart, Daaron Dimarco LCSW 09/05/2015, 1:05 PM

## 2015-09-05 NOTE — Discharge Summary (Signed)
Physician Discharge Summary Note  Patient:  Bryan Gallegos is an 40 y.o., male MRN:  [id number] DOB:  10-29-1975 Patient phone:  [phone] (home)  Patient address:   [address] [place] [address],   Total Time spent with patient: 30 minutes  Date of Admission:  09/04/2015  Date of Discharge: 09/05/15  Reason for Admission: Suicidal ideations/opioid withdrawal symptoms.  Principal Problem: Major depressive disorder, recurrent severe without psychotic features [facility])  Discharge Diagnoses: Patient Active Problem List   Diagnosis Date Noted  . Major depressive disorder, recurrent severe without psychotic features (HCC) [F33.2] 09/04/2015  . Polysubstance dependence including opioid type drug, episodic abuse (HCC) [F11.20, F19.20] 09/04/2015  . Left-sided weakness [M62.89] 08/21/2015  . Vomiting [R11.10] 08/21/2015  . Epigastric pain [R10.13]   . RUQ abdominal pain [R10.11]   . Intractable vomiting [R11.10] 05/17/2015  . Essential hypertension [I10] 05/17/2015  . Abdominal pain [R10.9] 05/17/2015  . Cholelithiasis [K80.20] 05/17/2015  . 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 Antisocial personality disorder [F60.2] 04/11/2015  . Opioid use disorder, severe, dependence (HCC) [F11.20] 04/11/2015  . Benzodiazepine dependence (HCC) [F13.20] 04/11/2015  . Opioid overdose [T40.2X1A] 04/11/2015  . Overdose [T50.901A] 04/11/2015  . 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 Altered mental status [R41.82]   . Hypoxic brain injury (HCC) [G93.1]   . HTN (hypertension) [I10] 04/10/2015  . Tobacco use disorder [F17.200] 04/10/2015  . Narcotic dependency, continuous (HCC) [F11.20] 03/14/2014  . Chronic back pain greater than 3 months duration [M54.9, G89.29] 01/28/2014  . Gastroesophageal reflux disease with esophagitis [K21.0] 01/28/2014  . Essential hypertension, benign [I10] 01/28/2014  . Asthma, mild persistent [J45.30] 01/28/2014  . Diabetes mellitus  type II, controlled (HCC) [E11.9] 01/28/2014  . Morbid obesity (HCC) [E66.01] 01/28/2014  . TIA (transient ischemic attack) [G45.9] 09/02/2013  . HLD (hyperlipidemia) [E78.5] 08/06/2013  . OSA (obstructive sleep apnea) [G47.33] 08/06/2013   History of Present Illness: This is an admission assessment for Bryan Gallegos, a 40 year old Caucasian male with hx of polysubstance dependence. Admitted to [facility] from the [facility] ED with complaints of suicidal ideations with plans to run his car off of a bridge. He came to the [facility] hospital for drug detoxification & mood stabilization treatments. During this assessment, Bryan Gallegos reports, "My son took me to the [facility] ED on Wednesday. I was not feeling good. I was having bad thoughts of hurting myself. I have not yet hurt myself this time, but I had in the past. I had taken overdose on pills in the past, just to hurt myself. I have been depressed for a while. My depression has worsened in the last 2 weeks due to my family member being hospitalized. I'm not on medication for the depression. I am hurting & pissed off right at this moment. I'm having withdrawal symptoms from having been on Oxycontin for 14 years. I was on Oxycontin q 4 hours daily. I have chronic back pain. I need to get back on the Oxycontin or get detoxification treatments".  Past Psychiatric History: has had several admissions at [facility], [facility] and other facilities. Pt reports hx of suicide attempts x 2. Pt denies being compliant on medications. Patient also had multiple admissions as a child.  As noted above, Bryan Gallegos was admitted to the [facility] adult unit for opioid detoxification. Initially, he presented to the ED with complaints of suicidal ideations with plans to run his car off of a bridge. He cited the hospitalization of a family member  as the trigger. During the admission assessment, Bryan Gallegos reported that he has been on Oxycontin tablets for his chronic back pain four times  daily. He requested a detoxification treatment for opioid addiction/withdrawal symptoms as he stated he has started to feel the withdrawal symptoms coming on. His request was granted. His other pertinent home medications were reviewed, reconciled & reinstated. He was enrolled & encouraged to participate in the group counseling sessions being offered & held on this unit.   During his admission assessment, Bryan Gallegos was noted be agitated, angry, snappy, short tempered & inpatient when dealing with the staff. Soon after his treatment orders were placed, Bryan Gallegos requested to be discharged to his home. He apologized to the staff for his behaviors. He stated that he is no longer depressed, suicidal or having opioid withdrawal symptoms. He stated that he wanted to be discharged to continue substance abuse treatment, mental health care & medical care on his own terms on an outpatient basis.   Upon discharge, he adamantly denies any SIHI, AVH, delusional thought, paranoia substance withdrawal symptoms. Bryan Gallegos was discharged with all personal belongings in no apparent distress. He was sent home on all his pertinent home medications as reported. No prescriptions given. He declines any referral/appointment for follow-up care as noted below.  Transportation per his arrangement  Past Medical History:  Past Medical History  Diagnosis Date  . Heart valve disorder     s/p echocardiogram  . Hypertension   . Asthma   . Diabetes mellitus   . Hyperlipidemia   . Anaphylactic reaction   . Bipolar disorder (HCC)   . Adult ADHD   . Morbidly obese (HCC)   . Transient cerebral ischemia     Unknown  . OSA (obstructive sleep apnea)   . Anxiety   . Complication of anesthesia     Per patient difficult intubation;  . Difficult intubation     Per patient    Past Surgical History  Procedure Laterality Date  . Carpal tunnel release    . Nose surgery    . Wisdom tooth extraction    . Tooth extraction    .  Esophagogastroduodenoscopy N/A 04/05/2015    Procedure: ESOPHAGOGASTRODUODENOSCOPY (EGD);  Surgeon: Malissa HippoNajeeb U Rehman, MD;  Location: [facility];  Service: Endoscopy;  Laterality: N/A;  . Esophagogastroduodenoscopy (egd) with propofol N/A 05/21/2015    Procedure: ESOPHAGOGASTRODUODENOSCOPY (EGD) WITH PROPOFOL;  Surgeon: Meryl DareMalcolm T Stark, MD;  Location: [facility] ENDOSCOPY;  Service: Endoscopy;  Laterality: N/A;   Family History:  Family History  Problem Relation Age of Onset  . CAD Mother     Living  . Diabetes Mellitus II Mother   . Stroke Mother   . Hypertension Mother   . Congestive Heart Failure Mother   . Kidney disease Mother   . Fibromyalgia Mother   . Thyroid disease Mother   . Hyperlipidemia Mother   . Liver disease Mother   . Alcoholism Father [id number]    Deceased  . Arthritis Maternal Grandmother   . Congestive Heart Failure Maternal Grandmother   . Hypertension Maternal Grandmother   . Lung cancer Maternal Grandfather   . Colon cancer Maternal Aunt   . Stomach cancer Maternal Aunt   . Heart disease Other     Paternal & Maternal  . Crohn's disease Other   . Hypertension Other     Paternal & Maternal  . Hypertension Brother     x3  . Hypertension Sister     #1  . Bipolar disorder  Sister     #1  . ADD / ADHD Son     x3  . Bipolar disorder Son     x3  . Asperger's syndrome Son    Social History: Pt is single , lives with girlfriend of 20 years , is on SSD ( per report) , reports he went up to college, and that his girlfriend is his payee. Currently is homeless , since he was in jail and could not pay his rent.Reports he has a court hearing today. History  Alcohol Use No     History  Drug Use No    Social History   Social History  . Marital Status: Single    Spouse Name: N/A  . Number of Children: N/A  . Years of Education: N/A   Social History Main Topics  . Smoking status: Current Every Day Smoker -- 0.50 packs/day for 23 years    Types: Cigarettes  . Smokeless  tobacco: Never Used  . Alcohol Use: No  . Drug Use: No  . Sexual Activity:    Partners: Female    Copy: None   Other Topics Concern  . None   Social History Narrative    Hospital Course:    Musculoskeletal: Strength & Muscle Tone: within normal limits Gait & Station: normal Patient leans: N/A  Psychiatric Specialty Exam: Review of Systems  Constitutional: Negative.   HENT: Negative.   Eyes: Negative.   Respiratory: Negative.   Cardiovascular: Negative.   Gastrointestinal: Negative.   Genitourinary: Negative.   Musculoskeletal: Negative.   Skin: Negative.   Neurological: Negative.   Endo/Heme/Allergies: Negative.   Psychiatric/Behavioral: Negative.     Blood pressure 136/72, pulse 94, temperature 97.5 F (36.4 C), temperature source Oral, resp. rate 21, height  (1.88 m), weight 167.831 kg (370 lb), SpO2 100 %.Body mass index is 47.49 kg/(m^2).  See Md's SRA   Have you used any form of tobacco in the last 30 days? (Cigarettes, Smokeless Tobacco, Cigars, and/or Pipes): No  Has this patient used any form of tobacco in the last 30 days? (Cigarettes, Smokeless Tobacco, Cigars, and/or Pipes) Yes, Yes, A prescription for an FDA-approved tobacco cessation medication was offered at discharge and the patient refused  Metabolic Disorder Labs:  Lab Results  Component Value Date   HGBA1C 5.9* 05/17/2015   MPG 123 05/17/2015   MPG 134 04/12/2015   No results found for: PROLACTIN Lab Results  Component Value Date   CHOL 148 09/04/2013   TRIG 228* 09/04/2013   HDL 19* 09/04/2013   CHOLHDL 7.8 09/04/2013   VLDL 46* 09/04/2013   LDLCALC 83 09/04/2013   LDLCALC 119* 08/07/2013   See Psychiatric Specialty Exam and Suicide Risk Assessment completed by Attending Physician prior to discharge.  Discharge destination:  Home  Is patient on multiple antipsychotic therapies at discharge:  No   Has Patient had three or more failed trials of antipsychotic  monotherapy by history:  No  Recommended Plan for Multiple Antipsychotic Therapies: NA    Medication List    STOP taking these medications        cloNIDine 0.2 MG tablet  Commonly known as:  CATAPRES     zolpidem 10 MG tablet  Commonly known as:  AMBIEN      TAKE these medications      Indication   albuterol 108 (90 Base) MCG/ACT inhaler  Commonly known as:  PROVENTIL HFA;VENTOLIN HFA  Inhale 2 puffs into the lungs every 6 (six)  hours as needed for wheezing or shortness of breath.   Indication:  Asthma, Chronic Obstructive Lung Disease     aspirin 325 MG tablet  Take 1 tablet (325 mg total) by mouth daily. For heart health   Indication:  Cardiac health     citalopram 10 MG tablet  Commonly known as:  CELEXA  Take 1 tablet (10 mg total) by mouth daily. For depression   Indication:  Depression     dexlansoprazole 60 MG capsule  Commonly known as:  DEXILANT  Take 1 capsule (60 mg total) by mouth daily. For acid reflux   Indication:  Gastroesophageal Reflux Disease     furosemide 20 MG tablet  Commonly known as:  LASIX  Take 1 tablet (20 mg total) by mouth daily. For swelling   Indication:  High Blood Pressure     gabapentin 400 MG capsule  Commonly known as:  NEURONTIN  Take 2 capsules (800 mg total) by mouth 3 (three) times daily. For agitation   Indication:  Aggressive Behavior, Agitation     hydrOXYzine 50 MG capsule  Commonly known as:  VISTARIL  Take 1 capsule (50 mg total) by mouth 3 (three) times daily as needed for anxiety.   Indication:  Anxiety Neurosis     insulin NPH Human 100 UNIT/ML injection  Commonly known as:  HUMULIN N  Inject 0.01-0.04 mLs (1-4 Units total) into the skin daily before breakfast. For diabetes   Indication:  Type 2 Diabetes     lisinopril 40 MG tablet  Commonly known as:  PRINIVIL,ZESTRIL  Take 1 tablet (40 mg total) by mouth 2 (two) times daily. For high blood pressure   Indication:  High Blood Pressure     ondansetron 8 MG  tablet  Commonly known as:  ZOFRAN  Take 1 tablet (8 mg total) by mouth 4 (four) times daily -  before meals and at bedtime. Nausea   Indication:  Nausea     sitaGLIPtin-metformin 50-1000 MG tablet  Commonly known as:  JANUMET  Take 1 tablet by mouth 2 (two) times daily with a meal. For diabetes   Indication:  Type 2 Diabetes     traZODone 50 MG tablet  Commonly known as:  DESYREL  Take 1 tablet (50 mg total) by mouth at bedtime. For sleep   Indication:  Trouble Sleeping        Follow-up recommendation: Activity:  As tolerated Diet: As recommended by your primary care doctor. Keep all scheduled follow-up appointments as recommended.  Comment: Patient is instructed prior to discharge to: Take all medications as prescribed by his/her mental healthcare provider. Report any adverse effects and or reactions from the medicines to his/her outpatient provider promptly. Patient has been instructed & cautioned: To not engage in alcohol and or illegal drug use while on prescription medicines. In the event of worsening symptoms, patient is instructed to call the crisis hotline, 911 and or go to the nearest ED for appropriate evaluation and treatment of symptoms. To follow-up with his/her primary care provider for your other medical issues, concerns and or health care needs.  Signed: Sanjuana KavaNwoko, Agnes I, NP, PMHNP, FNP-BC 09/05/2015, 12:07 PM

## 2015-09-05 NOTE — Progress Notes (Signed)
Recreation Therapy Notes  Date: 07.07.2017 Time: 9:30am Location: [address] Group Room   Group Topic: Stress Management  Goal Area(s) Addresses:  Patient will actively participate in stress management techniques presented during session.   Behavioral Response: Did not attend.   Marykay Lexenise L Christell Steinmiller, LRT/CTRS        Corky Blumstein L 09/05/2015 11:37 AM

## 2015-09-05 NOTE — H&P (Signed)
Psychiatric Admission Assessment Adult  Patient Identification: Bryan Gallegos MRN:  [id number]  Date of Evaluation:  09/05/2015  Chief Complaint: Suicidal ideations with plans to run car off of a bridge.  Principal Diagnosis:  Opioid use disorder, severe, dependence (HCC). Major depressive disorder, recurrent, severe without psychotic symptoms.  Diagnosis:   Patient Active Problem List   Diagnosis Date Noted  . Major depressive disorder, recurrent severe without psychotic features (HCC) [F33.2] 09/04/2015  . Polysubstance dependence including opioid type drug, episodic abuse (HCC) [F11.20, F19.20] 09/04/2015  . Left-sided weakness [M62.89] 08/21/2015  . Vomiting [R11.10] 08/21/2015  . Epigastric pain [R10.13]   . RUQ abdominal pain [R10.11]   . Intractable vomiting [R11.10] 05/17/2015  . Essential hypertension [I10] 05/17/2015  . Abdominal pain [R10.9] 05/17/2015  . Cholelithiasis [K80.20] 05/17/2015  . 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 Antisocial personality disorder [F60.2] 04/11/2015  . Opioid use disorder, severe, dependence (HCC) [F11.20] 04/11/2015  . Benzodiazepine dependence (HCC) [F13.20] 04/11/2015  . Opioid overdose [T40.2X1A] 04/11/2015  . Overdose [T50.901A] 04/11/2015  . 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 Altered mental status [R41.82]   . Hypoxic brain injury (HCC) [G93.1]   . HTN (hypertension) [I10] 04/10/2015  . Tobacco use disorder [F17.200] 04/10/2015  . Narcotic dependency, continuous (HCC) [F11.20] 03/14/2014  . Chronic back pain greater than 3 months duration [M54.9, G89.29] 01/28/2014  . Gastroesophageal reflux disease with esophagitis [K21.0] 01/28/2014  . Essential hypertension, benign [I10] 01/28/2014  . Asthma, mild persistent [J45.30] 01/28/2014  . Diabetes mellitus type II, controlled (HCC) [E11.9] 01/28/2014  . Morbid obesity (HCC) [E66.01] 01/28/2014  . TIA (transient ischemic attack) [G45.9] 09/02/2013  .  HLD (hyperlipidemia) [E78.5] 08/06/2013  . OSA (obstructive sleep apnea) [G47.33] 08/06/2013   History of Present Illness: This is an admission assessment for Bryan Gallegos, a 40 year old Caucasian male with hx of polysubstance dependence. Admitted to [facility] from the [facility] ED with complaints of suicidal ideations with plans to run his car off of a bridge. He came to the [facility] hospital for drug detoxification & mood stabilization treatments. During this assessment, Bryan Gallegos reports, "My son took me to the [facility] ED on Wednesday. I was not feeling good. I was having bad thoughts of hurting myself. I have not yet hurt myself this time, but I had in the past. I had taken overdose on pills in the past, just to hurt myself. I have been depressed for a while. My depression has worsened in the last 2 weeks due to my family member being hospitalized. I'm not on medication for the depression. I am hurting & pissed off right at this moment. I'm having withdrawal symptoms from having been on Oxycontin for 14 years. I was on Oxycontin q 4 hours daily. I have chronic back pain. I need to get back on the Oxycontin or get detoxification treatments".  Objective: Bryan Gallegos is seen, chart reviewed. He is agitated, restless, angry & short tempered at this time. He says he is hurting from opioid withdrawal symptoms. He wants to get started on the Opioid withdrawal symptoms. Will leave patient alone at this time as he is becoming restless & more angry. Will initiate Opioid/Ativan detox protocols.  Associated Signs/Symptoms: Depression Symptoms:  depressed mood, insomnia, psychomotor agitation, anxiety,  (Hypo) Manic Symptoms:  Impulsivity, Irritable Mood, Labiality of Mood,  Anxiety Symptoms:  Excessive Worry,  Psychotic Symptoms:  Denies any hallucinations, delusional thoughts or paranoia o  PTSD Symptoms: None reported  Total Time  spent with patient: 1 hour  Past Psychiatric History:   Is  the patient at risk to self? Yes.    Has the patient been a risk to self in the past 6 months? Yes.    Has the patient been a risk to self within the distant past? Yes.    Is the patient a risk to others? Yes.    Has the patient been a risk to others in the past 6 months? Yes.    Has the patient been a risk to others within the distant past? Yes.     Prior Inpatient Therapy: Yes, [facility] x numerous times, [facility], [facility] center, [facility].  Prior Outpatient Therapy: None reported  Alcohol Screening: 1. How often do you have a drink containing alcohol?: 2 to 4 times a month 2. How many drinks containing alcohol do you have on a typical day when you are drinking?: 3 or 4 3. How often do you have six or more drinks on one occasion?: Less than monthly Preliminary Score: 2 4. How often during the last year have you found that you were not able to stop drinking once you had started?: Never 5. How often during the last year have you failed to do what was normally expected from you becasue of drinking?: Less than monthly 6. How often during the last year have you needed a first drink in the morning to get yourself going after a heavy drinking session?: Less than monthly 7. How often during the last year have you had a feeling of guilt of remorse after drinking?: Less than monthly 8. How often during the last year have you been unable to remember what happened the night before because you had been drinking?: Less than monthly 9. Have you or someone else been injured as a result of your drinking?: No 10. Has a relative or friend or a doctor or another health worker been concerned about your drinking or suggested you cut down?: No Alcohol Use Disorder Identification Test Final Score (AUDIT): 8 Brief Intervention: Patient declined brief intervention  Substance Abuse History in the last 12 months:  Yes.    Consequences of Substance Abuse: Medical Consequences:  Liver damage, Possible death by  overdose, severe withdrawal symptoms, seixures. Legal Consequences:  Arrests, jail time, Loss of driving privilege. Family Consequences:  Family discord, divorce and or separation.  Previous Psychotropic Medications: Yes   Psychological Evaluations: No   Past Medical History:  Past Medical History  Diagnosis Date  . Heart valve disorder     s/p echocardiogram  . Hypertension   . Asthma   . Diabetes mellitus   . Hyperlipidemia   . Anaphylactic reaction   . Bipolar disorder (HCC)   . Adult ADHD   . Morbidly obese (HCC)   . Transient cerebral ischemia     Unknown  . OSA (obstructive sleep apnea)   . Anxiety   . Complication of anesthesia     Per patient difficult intubation;  . Difficult intubation     Per patient    Past Surgical History  Procedure Laterality Date  . Carpal tunnel release    . Nose surgery    . Wisdom tooth extraction    . Tooth extraction    . Esophagogastroduodenoscopy N/A 04/05/2015    Procedure: ESOPHAGOGASTRODUODENOSCOPY (EGD);  Surgeon: Malissa Hippo, MD;  Location: [facility];  Service: Endoscopy;  Laterality: N/A;  . Esophagogastroduodenoscopy (egd) with propofol N/A 05/21/2015    Procedure: ESOPHAGOGASTRODUODENOSCOPY (EGD)  WITH PROPOFOL;  Surgeon: Meryl DareMalcolm T Stark, MD;  Location: [facility];  Service: Endoscopy;  Laterality: N/A;   Family History:  Family History  Problem Relation Age of Onset  . CAD Mother     Living  . Diabetes Mellitus II Mother   . Stroke Mother   . Hypertension Mother   . Congestive Heart Failure Mother   . Kidney disease Mother   . Fibromyalgia Mother   . Thyroid disease Mother   . Hyperlipidemia Mother   . Liver disease Mother   . Alcoholism Father [id number]    Deceased  . Arthritis Maternal Grandmother   . Congestive Heart Failure Maternal Grandmother   . Hypertension Maternal Grandmother   . Lung cancer Maternal Grandfather   . Colon cancer Maternal Aunt   . Stomach cancer Maternal Aunt   . Heart disease  Other     Paternal & Maternal  . Crohn's disease Other   . Hypertension Other     Paternal & Maternal  . Hypertension Brother     x3  . Hypertension Sister     #1  . Bipolar disorder Sister     #1  . ADD / ADHD Son     x3  . Bipolar disorder Son     x3  . Asperger's syndrome Son    Family Psychiatric  History: Depression: Mother  Tobacco Screening:Yes, smokes cigarettes - about a pack of cigarettes daily.  Social History: Bryan Gallegos is single, lives with his girlfriend of 20 years, is on SSD ( per report) , reports he went up to college & that his girlfriend is his payee.  History  Alcohol Use No     History  Drug Use No    Additional Social History: Marital status: Long term relationship Long term relationship, how long?: 27 years with mother of his kids. "We just never got married because things change when you do."  What types of issues is patient dealing with in the relationship?: both suffer from mental health issues Additional relationship information: n/a  Are you sexually active?: Yes What is your sexual orientation?: heterosexual Has your sexual activity been affected by drugs, alcohol, medication, or emotional stress?: n/a  Does patient have children?: Yes How many children?: 3 How is patient's relationship with their children?: "I have 3 boys. one was just here for detox." 24, 25, and 42 yo boys. "I love my boys. they make my life better."     Pain Medications: oxycodone Prescriptions: pt states yes History of alcohol / drug use?: Yes Longest period of sobriety (when/how long): unknown Negative Consequences of Use: Financial, Legal, Personal relationships Withdrawal Symptoms: Fever / Chills, Cramps  Allergies:   Allergies  Allergen Reactions  . Atenolol Anaphylaxis  . Other Anaphylaxis    "Prednisone like drugs"  . Tylenol [Acetaminophen] Other (See Comments)    GI Bleed   Lab Results:  Results for orders placed or performed during the hospital  encounter of 09/04/15 (from the past 48 hour(s))  Glucose, capillary     Status: Abnormal   Collection Time: 09/05/15  6:43 AM  Result Value Ref Range   Glucose-Capillary 124 (H) 65 - 99 mg/dL   Metabolic Disorder Labs:  Lab Results  Component Value Date   HGBA1C 5.9* 05/17/2015   MPG 123 05/17/2015   MPG 134 04/12/2015   No results found for: PROLACTIN Lab Results  Component Value Date   CHOL 148 09/04/2013   TRIG 228* 09/04/2013   HDL 19*  09/04/2013   CHOLHDL 7.8 09/04/2013   VLDL 46* 09/04/2013   LDLCALC 83 09/04/2013   LDLCALC 119* 08/07/2013   Current Medications: Current Facility-Administered Medications  Medication Dose Route Frequency Provider Last Rate Last Dose  . albuterol (PROVENTIL HFA;VENTOLIN HFA) 108 (90 Base) MCG/ACT inhaler 2 puff  2 puff Inhalation Q6H PRN Charm RingsJamison Y Lord, NP      . alum & mag hydroxide-simeth (MAALOX/MYLANTA) 200-200-20 MG/5ML suspension 30 mL  30 mL Oral Q4H PRN Charm RingsJamison Y Lord, NP      . aspirin tablet 325 mg  325 mg Oral Daily Charm RingsJamison Y Lord, NP   325 mg at 09/05/15 0759  . citalopram (CELEXA) tablet 10 mg  10 mg Oral Daily Charm RingsJamison Y Lord, NP   10 mg at 09/05/15 0758  . cloNIDine (CATAPRES) tablet 0.2 mg  0.2 mg Oral BID Charm RingsJamison Y Lord, NP   0.2 mg at 09/05/15 0759  . furosemide (LASIX) tablet 20 mg  20 mg Oral Daily Charm RingsJamison Y Lord, NP   20 mg at 09/05/15 0759  . gabapentin (NEURONTIN) capsule 800 mg  800 mg Oral TID Charm RingsJamison Y Lord, NP   800 mg at 09/05/15 0759  . hydrOXYzine (ATARAX/VISTARIL) tablet 25 mg  25 mg Oral Q6H PRN Craige CottaFernando A Cobos, MD   25 mg at 09/04/15 1833  . ibuprofen (ADVIL,MOTRIN) tablet 600 mg  600 mg Oral Q8H PRN Charm RingsJamison Y Lord, NP   600 mg at 09/04/15 1833  . insulin aspart (novoLOG) injection 0-15 Units  0-15 Units Subcutaneous TID WC Charm RingsJamison Y Lord, NP   2 Units at 09/05/15 [phone]  . lisinopril (PRINIVIL,ZESTRIL) tablet 40 mg  40 mg Oral BID Charm RingsJamison Y Lord, NP   40 mg at 09/05/15 0759  . loperamide (IMODIUM) capsule 2-4  mg  2-4 mg Oral PRN Craige CottaFernando A Cobos, MD   4 mg at 09/04/15 1833  . LORazepam (ATIVAN) tablet 0.5 mg  0.5 mg Oral Q6H PRN Craige CottaFernando A Cobos, MD   0.5 mg at 09/05/15 0416  . LORazepam (ATIVAN) tablet 1 mg  1 mg Oral Q6H PRN Rockey SituFernando A Cobos, MD      . magnesium hydroxide (MILK OF MAGNESIA) suspension 30 mL  30 mL Oral Daily PRN Charm RingsJamison Y Lord, NP      . methocarbamol (ROBAXIN) tablet 500 mg  500 mg Oral Q8H PRN Craige CottaFernando A Cobos, MD      . multivitamin with minerals tablet 1 tablet  1 tablet Oral Daily Craige CottaFernando A Cobos, MD   1 tablet at 09/05/15 0759  . nicotine (NICODERM CQ - dosed in mg/24 hr) patch 7 mg  7 mg Transdermal Daily Charm RingsJamison Y Lord, NP   7 mg at 09/05/15 0800  . ondansetron (ZOFRAN) tablet 4 mg  4 mg Oral Q8H PRN Charm RingsJamison Y Lord, NP      . ondansetron (ZOFRAN-ODT) disintegrating tablet 4 mg  4 mg Oral Q6H PRN Craige CottaFernando A Cobos, MD   4 mg at 09/04/15 1833  . pantoprazole (PROTONIX) EC tablet 40 mg  40 mg Oral Daily Charm RingsJamison Y Lord, NP   40 mg at 09/05/15 0759  . thiamine (B-1) injection 100 mg  100 mg Intramuscular Once Craige CottaFernando A Cobos, MD   100 mg at 09/04/15 1744  . thiamine (VITAMIN B-1) tablet 100 mg  100 mg Oral Daily Craige CottaFernando A Cobos, MD   100 mg at 09/05/15 0759  . traZODone (DESYREL) tablet 100 mg  100 mg Oral QHS Charm RingsJamison Y Lord,  NP   100 mg at 09/04/15 2227   PTA Medications: Prescriptions prior to admission  Medication Sig Dispense Refill Last Dose  . albuterol (PROVENTIL HFA;VENTOLIN HFA) 108 (90 Base) MCG/ACT inhaler Inhale 2 puffs into the lungs every 6 (six) hours as needed for wheezing or shortness of breath. 1 Inhaler 0 Unknown at Unknown time  . aspirin 325 MG tablet Take 1 tablet (325 mg total) by mouth daily. 30 tablet 0 Unknown at Unknown time  . cloNIDine (CATAPRES) 0.2 MG tablet Take 0.2 mg by mouth 2 (two) times daily.   Unknown at Unknown time  . furosemide (LASIX) 20 MG tablet Take 1 tablet (20 mg total) by mouth daily. 30 tablet 0 Unknown at Unknown time  .  gabapentin (NEURONTIN) 400 MG capsule Take 2 capsules (800 mg total) by mouth 3 (three) times daily. 180 capsule 0 Unknown at Unknown time  . hydrOXYzine (VISTARIL) 50 MG capsule Take 1 capsule (50 mg total) by mouth 3 (three) times daily as needed for anxiety. 30 capsule 0 Unknown at Unknown time  . insulin NPH Human (HUMULIN N) 100 UNIT/ML injection Inject 1-4 Units into the skin.   Unknown at Unknown time  . lisinopril (PRINIVIL,ZESTRIL) 40 MG tablet Take 1 tablet (40 mg total) by mouth 2 (two) times daily. 60 tablet 0 Unknown at Unknown time  . ondansetron (ZOFRAN) 8 MG tablet Take 1 tablet (8 mg total) by mouth 4 (four) times daily -  before meals and at bedtime. 20 tablet 0 09/03/2015 at Unknown time  . sitaGLIPtin-metformin (JANUMET) 50-1000 MG tablet Take 1 tablet by mouth 2 (two) times daily with a meal.   Unknown at Unknown time  . traZODone (DESYREL) 50 MG tablet Take 50 mg by mouth at bedtime.   Unknown at Unknown time  . zolpidem (AMBIEN) 10 MG tablet Take 10 mg by mouth at bedtime.   Unknown at Unknown time   Musculoskeletal: Strength & Muscle Tone: within normal limits Gait & Station: normal Patient leans: N/A  Psychiatric Specialty Exam: Physical Exam  Nursing note and vitals reviewed. Constitutional: He is oriented to person, place, and time. He appears well-developed.  Obese  HENT:  Head: Normocephalic.  Eyes: Pupils are equal, round, and reactive to light.  Cardiovascular: Normal rate and normal heart sounds.   Hx, Cardiac issues  Respiratory: No respiratory distress. He has no wheezes. He has no rales. He exhibits no tenderness.  Hx. Asthma  GI: Soft.  Genitourinary:  Denies any issues in this area  Musculoskeletal: Normal range of motion.  Neurological: He is alert and oriented to person, place, and time.  Skin: Skin is warm and dry.  Psychiatric: Thought content normal. His mood appears anxious. His affect is inappropriate. His affect is not angry, not blunt and  not labile. He is agitated. Cognition and memory are normal. He expresses impulsivity. He exhibits a depressed mood.    Review of Systems  Constitutional: Positive for chills and diaphoresis.  HENT: Negative.   Eyes: Negative.   Respiratory: Negative.   Cardiovascular: Negative.   Gastrointestinal: Positive for nausea and abdominal pain.  Genitourinary: Negative.   Musculoskeletal: Positive for myalgias.  Skin: Negative.   Neurological: Negative.   Endo/Heme/Allergies: Negative.   Psychiatric/Behavioral: Positive for depression and substance abuse (Hx. Opioid/other drug addiction). Negative for suicidal ideas and memory loss. The patient is nervous/anxious and has insomnia.   All other systems reviewed and are negative.   Blood pressure 136/72, pulse 94, temperature 97.5 F (  36.4 C), temperature source Oral, resp. rate 21, height  (1.88 m), weight 167.831 kg (370 lb), SpO2 100 %.Body mass index is 47.49 kg/(m^2).  General Appearance: Casual, fairly grromed, agitated  Eye Contact::  Fair  Speech:  Clear and Coherent and Normal Rate  Volume:  Increased  Mood:  Angry, Anxious and Irritable  Affect:  Labile  Thought Process:  Coherent and Descriptions of Associations: Intact  Orientation:  Full (Time, Place, and Person)  Thought Content:  Rumination, denies any hallucinations, delusional thoughts or paranoia.  Suicidal Thoughts:  Yes, that was why I went to to the ED in the first place  Homicidal Thoughts:  Denies any thoughts, plans or intent  Memory:  Immediate;   Good Recent;   Good Remote;   Good  Judgement:  Intact  Insight:  Lacking  Psychomotor Activity:  Increased and Restlessness  Concentration:  Poor  Recall:  Good  Fund of Knowledge:Fair  Language: Good  Akathisia:  Negative  Handed:  Right  AIMS (if indicated):     Assets:  Communication Skills Desire for Improvement  ADL's:  Intact  Cognition: WNL  Sleep:      Treatment Plan Summary: Daily contact with  patient to assess and evaluate symptoms and progress in treatment and Medication management : 1. Admit for crisis management and stabilization, estimated length of stay 3-5 days.  2. Medication management to reduce current symptoms to base line and improve the patient's overall level of functioning; Will initiate Opioid/Ativan detox protocols for Opioid/Benzodiazepine withdrawals, will continue the Neurontin 800 mg for agitation, Seroquel 25 mg for agitation/mood control, Trazodone 150 mg for insomnia, Citalopram 10 mg for depression. 3. Treat health problems as indicated.  4. Develop treatment plan to decrease risk of relapse upon discharge and the need for readmission.  5. Psycho-social education regarding relapse prevention and self care.  6. Health care follow up as needed for medical problems.  7. Review, reconcile, and reinstate any pertinent home medications for other health issues where appropriate; will resume; ASA 325 mg for heart health, Albuterol inhaler prn for SOB, Furosemide 20 mg for swellings, Ibuprofen 600 mg for pain & Protonix 40 mg for GERD 8. Call for consults with hospitalist for any additional specialty patient care services as needed.  Observation Level/Precautions:  15 minute checks  Labs: Per ED  Psychotherapy:  Individual and group therapy  Medications: Will initiate Opioid/Ativan detox protocols for Opioid/Benzodiazepine withdrawals, will continue the Neurontin 800 mg for agitation, Seroquel 25 mg for agitation/mood control, Trazodone 150 mg for insomnia, Citalopram 10 mg for depression, ASA 325 mg for heart health, Albuterol inhaler prn for SOB, Furosemide 20 mg for swellings, Ibuprofen 600 mg for pain & Protonix 40 mg for GERD  Consultations: As needed  Discharge Concerns: Mood stability, safety   Length Of stay: 2-4 days  Other: Admit to [address]   I certify that inpatient services furnished can reasonably be expected to improve the patient's condition.    Sanjuana Kava, NP, PMHNP, FNP-BC 7/7/201711:07 AM

## 2015-09-05 NOTE — Progress Notes (Signed)
Per pt request[place] [facility] [place] [address] Pt reports that he has a court date for today 09/05/2015. Fax and confirmation pages provided to pt for his record.  Trula SladeHeather Smart, MSW, LCSW Clinical Social Worker 09/05/2015 10:33 AM

## 2015-09-05 NOTE — [facility] Group Notes (Signed)
[facility]   09/05/2015 9:08 AM  Participation Quality:  Invited. DID NOT ATTEND.   Smart, Sila Sarsfield LCSW

## 2015-09-13 ENCOUNTER — Encounter (HOSPITAL_COMMUNITY): Payer: Self-pay | Admitting: Emergency Medicine

## 2015-09-13 ENCOUNTER — Observation Stay (HOSPITAL_COMMUNITY)
Admission: EM | Admit: 2015-09-13 | Discharge: 2015-09-17 | Disposition: A | Discharge status: Other Acute Inpatient Hospital | Payer: Medicare Other | Attending: Internal Medicine | Admitting: Internal Medicine

## 2015-09-13 DIAGNOSIS — F419 Anxiety disorder, unspecified: Secondary | ICD-10-CM | POA: Insufficient documentation

## 2015-09-13 DIAGNOSIS — Z794 Long term (current) use of insulin: Secondary | ICD-10-CM | POA: Insufficient documentation

## 2015-09-13 DIAGNOSIS — F909 Attention-deficit hyperactivity disorder, unspecified type: Secondary | ICD-10-CM | POA: Diagnosis not present

## 2015-09-13 DIAGNOSIS — Z79899 Other long term (current) drug therapy: Secondary | ICD-10-CM | POA: Insufficient documentation

## 2015-09-13 DIAGNOSIS — Z7982 Long term (current) use of aspirin: Secondary | ICD-10-CM | POA: Insufficient documentation

## 2015-09-13 DIAGNOSIS — I10 Essential (primary) hypertension: Secondary | ICD-10-CM | POA: Insufficient documentation

## 2015-09-13 DIAGNOSIS — G8929 Other chronic pain: Secondary | ICD-10-CM | POA: Diagnosis not present

## 2015-09-13 DIAGNOSIS — K210 Gastro-esophageal reflux disease with esophagitis: Secondary | ICD-10-CM | POA: Insufficient documentation

## 2015-09-13 DIAGNOSIS — Z8673 Personal history of transient ischemic attack (TIA), and cerebral infarction without residual deficits: Secondary | ICD-10-CM | POA: Insufficient documentation

## 2015-09-13 DIAGNOSIS — E119 Type 2 diabetes mellitus without complications: Secondary | ICD-10-CM | POA: Diagnosis not present

## 2015-09-13 DIAGNOSIS — F331 Major depressive disorder, recurrent, moderate: Secondary | ICD-10-CM | POA: Diagnosis present

## 2015-09-13 DIAGNOSIS — T50902A Poisoning by unspecified drugs, medicaments and biological substances, intentional self-harm, initial encounter: Principal | ICD-10-CM | POA: Insufficient documentation

## 2015-09-13 DIAGNOSIS — F332 Major depressive disorder, recurrent severe without psychotic features: Secondary | ICD-10-CM | POA: Diagnosis not present

## 2015-09-13 DIAGNOSIS — Z6841 Body Mass Index (BMI) 40.0 and over, adult: Secondary | ICD-10-CM | POA: Insufficient documentation

## 2015-09-13 DIAGNOSIS — J4530 Mild persistent asthma, uncomplicated: Secondary | ICD-10-CM | POA: Insufficient documentation

## 2015-09-13 DIAGNOSIS — G4733 Obstructive sleep apnea (adult) (pediatric): Secondary | ICD-10-CM | POA: Diagnosis not present

## 2015-09-13 DIAGNOSIS — F17210 Nicotine dependence, cigarettes, uncomplicated: Secondary | ICD-10-CM | POA: Insufficient documentation

## 2015-09-13 DIAGNOSIS — T50901A Poisoning by unspecified drugs, medicaments and biological substances, accidental (unintentional), initial encounter: Secondary | ICD-10-CM | POA: Diagnosis present

## 2015-09-13 NOTE — ED Notes (Signed)
IV access attempted multiple times without success. IV team order placed.

## 2015-09-13 NOTE — ED Provider Notes (Signed)
CSN: [id number]     Arrival date & time 09/13/15  2247 History   By signing my name below, I, Phillis Haggis, attest that this documentation has been prepared under the direction and in the presence of Lyndal Pulley, MD. Electronically Signed: Phillis Haggis, ED Scribe. 09/13/2015. 11:01 PM.   Chief Complaint  Patient presents with  . Drug Overdose   Patient is a 40 y.o. male presenting with Overdose. The history is provided by the patient. No language interpreter was used.  Drug Overdose This is a new problem. The current episode started 1 to 2 hours ago. The problem occurs constantly. The problem has not changed since onset.He has tried nothing for the symptoms.  HPI Comments: BROC CASPERS is a 40 y.o. Male with a hx of HTN, DM, Bipolar Disorder, adult ADHD, and transient cerebral ischemia brought in by EMS who presents to the Emergency Department complaining of drug overdose onset 2 hours ago. Pt reports ingesting 7 0.1 mg Clonodine, 15 Lexapro 40 mg, 30 Simvastatin 20 mg at 9 PM. Pt was seen on 09/03/15 for SI and was hospitalized at [facility]. Pt states that he is tired of hurting, stress and depression. He expresses SI this evening, worsening after discharge from [facility] He reports that he did not have a way to the hospital when he needed help. He states that he is hurting from slipped discs in his back and chronic knee pain, but is "too big" for surgery. Pt is okay with going back to [facility]. Poison Control has been called and notified. He denies nausea or vomiting.   Past Medical History  Diagnosis Date  . Heart valve disorder     s/p echocardiogram  . Hypertension   . Asthma   . Diabetes mellitus   . Hyperlipidemia   . Anaphylactic reaction   . Bipolar disorder (HCC)   . Adult ADHD   . Morbidly obese (HCC)   . Transient cerebral ischemia     Unknown  . OSA (obstructive sleep apnea)   . Anxiety   . Complication of anesthesia     Per patient  difficult intubation;  . Difficult intubation     Per patient   Past Surgical History  Procedure Laterality Date  . Carpal tunnel release    . Nose surgery    . Wisdom tooth extraction    . Tooth extraction    . Esophagogastroduodenoscopy N/A 04/05/2015    Procedure: ESOPHAGOGASTRODUODENOSCOPY (EGD);  Surgeon: Malissa Hippo, MD;  [facility];  Service: Endoscopy;  Laterality: N/A;  . Esophagogastroduodenoscopy (egd) with propofol N/A 05/21/2015    Procedure: ESOPHAGOGASTRODUODENOSCOPY (EGD) WITH PROPOFOL;  Surgeon: Meryl Dare, MD;  Location: [facility];  Service: Endoscopy;  Laterality: N/A;   Family History  Problem Relation Age of Onset  . CAD Mother     Living  . Diabetes Mellitus II Mother   . Stroke Mother   . Hypertension Mother   . Congestive Heart Failure Mother   . Kidney disease Mother   . Fibromyalgia Mother   . Thyroid disease Mother   . Hyperlipidemia Mother   . Liver disease Mother   . Alcoholism Father 61    Deceased  . Arthritis Maternal Grandmother   . Congestive Heart Failure Maternal Grandmother   . Hypertension Maternal Grandmother   . Lung cancer Maternal Grandfather   . Colon cancer Maternal Aunt   . Stomach cancer Maternal Aunt   . Heart disease Other  Paternal & Maternal  . Crohn's disease Other   . Hypertension Other     Paternal & Maternal  . Hypertension Brother     x3  . Hypertension Sister     #1  . Bipolar disorder Sister     #1  . ADD / ADHD Son     x3  . Bipolar disorder Son     x3  . Asperger's syndrome Son    Social History  Substance Use Topics  . Smoking status: Current Every Day Smoker -- 0.50 packs/day for 23 years    Types: Cigarettes  . Smokeless tobacco: Never Used  . Alcohol Use: No    Review of Systems  Gastrointestinal: Negative for nausea and vomiting.  Psychiatric/Behavioral: Positive for suicidal ideas and self-injury.  All other systems reviewed and are negative.  Allergies   Atenolol; Other; and Tylenol  Home Medications   Prior to Admission medications   Medication Sig Start Date End Date Taking? Authorizing Provider  albuterol (PROVENTIL HFA;VENTOLIN HFA) 108 (90 Base) MCG/ACT inhaler Inhale 2 puffs into the lungs every 6 (six) hours as needed for wheezing or shortness of breath. 09/05/15   Sanjuana Kava, NP  aspirin 325 MG tablet Take 1 tablet (325 mg total) by mouth daily. For heart health 09/05/15   Sanjuana Kava, NP  citalopram (CELEXA) 10 MG tablet Take 1 tablet (10 mg total) by mouth daily. For depression 09/05/15   Sanjuana Kava, NP  dexlansoprazole (DEXILANT) 60 MG capsule Take 1 capsule (60 mg total) by mouth daily. For acid reflux 09/05/15   Sanjuana Kava, NP  furosemide (LASIX) 20 MG tablet Take 1 tablet (20 mg total) by mouth daily. For swelling 09/05/15   Sanjuana Kava, NP  gabapentin (NEURONTIN) 400 MG capsule Take 2 capsules (800 mg total) by mouth 3 (three) times daily. For agitation 09/05/15   Sanjuana Kava, NP  hydrOXYzine (VISTARIL) 50 MG capsule Take 1 capsule (50 mg total) by mouth 3 (three) times daily as needed for anxiety. 09/05/15   Sanjuana Kava, NP  insulin NPH Human (HUMULIN N) 100 UNIT/ML injection Inject 0.01-0.04 mLs (1-4 Units total) into the skin daily before breakfast. For diabetes 09/05/15   Sanjuana Kava, NP  lisinopril (PRINIVIL,ZESTRIL) 40 MG tablet Take 1 tablet (40 mg total) by mouth 2 (two) times daily. For high blood pressure 09/05/15   Sanjuana Kava, NP  ondansetron (ZOFRAN) 8 MG tablet Take 1 tablet (8 mg total) by mouth 4 (four) times daily -  before meals and at bedtime. Nausea 09/05/15   Sanjuana Kava, NP  sitaGLIPtin-metformin (JANUMET) 50-1000 MG tablet Take 1 tablet by mouth 2 (two) times daily with a meal. For diabetes 09/05/15   Sanjuana Kava, NP  traZODone (DESYREL) 50 MG tablet Take 1 tablet (50 mg total) by mouth at bedtime. For sleep 09/05/15   Sanjuana Kava, NP   BP 147/79 mmHg  Pulse 80  Temp(Src) 98 F (36.7 C) (Oral)  Resp  17  SpO2 97% Physical Exam  Constitutional: He is oriented to person, place, and time. He appears well-developed and well-nourished. No distress.  HENT:  Head: Normocephalic and atraumatic.  Eyes: Conjunctivae are normal.  Neck: Neck supple. No tracheal deviation present.  Cardiovascular: Normal rate, regular rhythm and normal heart sounds.   Pulmonary/Chest: Effort normal. No respiratory distress.  Clear to auscultation bilaterally  Abdominal: Soft. He exhibits no distension.  Neurological: He is alert and oriented  to person, place, and time.  Skin: Skin is warm and dry.  Psychiatric: He has a normal mood and affect.  Nursing note and vitals reviewed.   ED Course  Procedures (including critical care time)  Procedure note: Ultrasound Guided Peripheral IV Ultrasound guided peripheral 1.88 inch angiocath IV placement performed by me. Indications: Nursing unable to place IV. Details: The antecubital fossa and upper arm were evaluated with a multifrequency linear probe. Patent brachial veins were noted. 1 attempt was made to cannulate a vein under realtime US guidance with successful cannulation of the vein and catheter placement. There is return of non-pulsatile dark red blood. The patient tolerated the procedure well without complications. Images archived electronically.  CPT codes: [id number] and [phone]   DIAGNOSTIC STUDIES: Oxygen Saturation is 97% on RA, normal by my interpretation.    COORDINATION OF CARE: 11:18 PM-Discussed treatment plan which includes monitoring overnight and labs with pt at bedside and pt agreed to plan.   Labs Review Labs Reviewed  COMPREHENSIVE METABOLIC PANEL - Abnormal; Notable for the following:    Glucose, Bld 129 (*)    Calcium 8.7 (*)    Albumin 3.3 (*)    AST 44 (*)    All other components within normal limits  ACETAMINOPHEN LEVEL - Abnormal; Notable for the following:    Acetaminophen (Tylenol), Serum <10 (*)    All other components within normal  limits  CBC - Abnormal; Notable for the following:    WBC 11.2 (*)    All other components within normal limits  URINE RAPID DRUG SCREEN, HOSP PERFORMED - Abnormal; Notable for the following:    Opiates POSITIVE (*)    Benzodiazepines POSITIVE (*)    All other components within normal limits  CBG MONITORING, ED - Abnormal; Notable for the following:    Glucose-Capillary 139 (*)    All other components within normal limits  ETHANOL  SALICYLATE LEVEL    Imaging Review No results found. I have personally reviewed and evaluated these images and lab results as part of my medical decision-making.   EKG Interpretation   Date/Time:  Saturday September 13 2015 23:12:57 EDT Ventricular Rate:  81 PR Interval:    QRS Duration: 92 QT Interval:  369 QTC Calculation: 429 R Axis:   89 Text Interpretation:  Sinus rhythm Consider left atrial enlargement No  significant change since last tracing Confirmed by Crisanto Nied MD, Reuel BoomANIEL  ([id number]) on 09/13/2015 11:19:18 PM      MDM   Final diagnoses:  Suicide attempt by drug ingestion, initial encounter (HCC)    .40 y.o. male presents with A multi drug ingestion as noted above. He explains his intent was to kill himself. He is otherwise well-appearing. Due to the nature of his ingestion poison control is recommending to 4 hours of observation for QTC monitoring on telemetry. No evidence of significant coingestions or intoxication currently. Patient will be admitted to hospitalist service pending medical clearance for psychiatric evaluation and placement. The patient agrees to stay voluntarily for the evaluation.  Hospitalist was consulted for admission and will see the patient in the emergency department.   I personally performed the services described in this documentation, which was scribed in my presence. The recorded information has been reviewed and is accurate.    Lyndal Pulleyaniel Blanche Scovell, MD 09/14/15 [phone]

## 2015-09-13 NOTE — ED Notes (Signed)
Pt reports to taking Lexapro, Clonidine, and Simvistatin due to increased stress in his life and no longer wanting to live. Pt reports being hospitalized by [facility]. Pt alert and oriented x 4 no acute distress at this time.

## 2015-09-13 NOTE — ED Notes (Signed)
PER EMS pt reports to have ingested appx 7 Clonidine 0.1mg , 15 Lexapro 40mg , 30 Simvastatin 20mg  at 2100 tonight. Pt voiced desire to die as cause of ingestion. Pt alert and oriented x 4. EMS unable to obtain IV access.

## 2015-09-13 NOTE — ED Notes (Signed)
Bed: RESA Expected date:  Expected time:  Means of arrival:  Comments: 2041 M OD

## 2015-09-13 NOTE — ED Notes (Signed)
[facility] due to ingestion of Simvastatin, Lexapro, and Clonidine. Advised to monitor for 24 hours on cardiac monitor and every 6hr EKG along with monitoring of QTC >500, No administration of Charcoal.

## 2015-09-14 DIAGNOSIS — T50901A Poisoning by unspecified drugs, medicaments and biological substances, accidental (unintentional), initial encounter: Secondary | ICD-10-CM | POA: Diagnosis present

## 2015-09-14 DIAGNOSIS — E119 Type 2 diabetes mellitus without complications: Secondary | ICD-10-CM | POA: Diagnosis not present

## 2015-09-14 DIAGNOSIS — T43222A Poisoning by selective serotonin reuptake inhibitors, intentional self-harm, initial encounter: Secondary | ICD-10-CM | POA: Diagnosis not present

## 2015-09-14 DIAGNOSIS — T465X2A Poisoning by other antihypertensive drugs, intentional self-harm, initial encounter: Secondary | ICD-10-CM | POA: Diagnosis not present

## 2015-09-14 DIAGNOSIS — T466X2A Poisoning by antihyperlipidemic and antiarteriosclerotic drugs, intentional self-harm, initial encounter: Secondary | ICD-10-CM

## 2015-09-14 DIAGNOSIS — F332 Major depressive disorder, recurrent severe without psychotic features: Secondary | ICD-10-CM

## 2015-09-14 DIAGNOSIS — T50902A Poisoning by unspecified drugs, medicaments and biological substances, intentional self-harm, initial encounter: Secondary | ICD-10-CM | POA: Diagnosis not present

## 2015-09-14 DIAGNOSIS — I10 Essential (primary) hypertension: Secondary | ICD-10-CM | POA: Diagnosis not present

## 2015-09-14 DIAGNOSIS — T1491 Suicide attempt: Secondary | ICD-10-CM

## 2015-09-14 LAB — COMPREHENSIVE METABOLIC PANEL
ALBUMIN: 3.3 g/dL — AB (ref 3.5–5.0)
ALK PHOS: 67 U/L (ref 38–126)
ALT: 55 U/L (ref 17–63)
ANION GAP: 6 (ref 5–15)
AST: 44 U/L — ABNORMAL HIGH (ref 15–41)
BILIRUBIN TOTAL: 1.1 mg/dL (ref 0.3–1.2)
BUN: 10 mg/dL (ref 6–20)
CALCIUM: 8.7 mg/dL — AB (ref 8.9–10.3)
CO2: 27 mmol/L (ref 22–32)
Chloride: 103 mmol/L (ref 101–111)
Creatinine, Ser: 0.73 mg/dL (ref 0.61–1.24)
GFR calc non Af Amer: >60 mL/min (ref >60)
Glucose, Bld: 129 mg/dL — ABNORMAL HIGH (ref 65–99)
POTASSIUM: 4 mmol/L (ref 3.5–5.1)
SODIUM: 136 mmol/L (ref 135–145)
TOTAL PROTEIN: 7.6 g/dL (ref 6.5–8.1)

## 2015-09-14 LAB — CBC
HCT: 42 % (ref 39.0–52.0)
Hemoglobin: 14.4 g/dL (ref 13.0–17.0)
MCH: 30.9 pg (ref 26.0–34.0)
MCHC: 34.3 g/dL (ref 30.0–36.0)
MCV: 90.1 fL (ref 78.0–100.0)
PLATELETS: 226 10*3/uL (ref 150–400)
RBC: 4.66 MIL/uL (ref 4.22–5.81)
RDW: 12.8 % (ref 11.5–15.5)
WBC: 11.2 10*3/uL — AB (ref 4.0–10.5)

## 2015-09-14 LAB — RAPID URINE DRUG SCREEN, HOSP PERFORMED
Amphetamines: NOT DETECTED
BENZODIAZEPINES: POSITIVE — AB
Barbiturates: NOT DETECTED
COCAINE: NOT DETECTED
OPIATES: POSITIVE — AB
Tetrahydrocannabinol: NOT DETECTED

## 2015-09-14 LAB — ACETAMINOPHEN LEVEL

## 2015-09-14 LAB — GLUCOSE, CAPILLARY
Glucose-Capillary: 174 mg/dL — ABNORMAL HIGH (ref 65–99)
Glucose-Capillary: 139 mg/dL — ABNORMAL HIGH (ref 65–99)
Glucose-Capillary: 127 mg/dL — ABNORMAL HIGH (ref 65–99)

## 2015-09-14 LAB — ETHANOL: Alcohol, Ethyl (B): <5 mg/dL (ref <5)

## 2015-09-14 LAB — CBG MONITORING, ED: GLUCOSE-CAPILLARY: 139 mg/dL — AB (ref 65–99)

## 2015-09-14 LAB — SALICYLATE LEVEL

## 2015-09-14 MED ORDER — SODIUM CHLORIDE 0.9% FLUSH
3.0000 mL | Freq: Two times a day (BID) | INTRAVENOUS | Status: DC
  Administered 2015-09-14 – 2015-09-15 (×5): 3 mL via INTRAVENOUS

## 2015-09-14 MED ORDER — INSULIN ASPART 100 UNIT/ML ~~LOC~~ SOLN
0.0000 [IU] | Freq: Three times a day (TID) | SUBCUTANEOUS | Status: DC
  Administered 2015-09-14: 3 [IU] via SUBCUTANEOUS
  Administered 2015-09-14 (×2): 2 [IU] via SUBCUTANEOUS
  Administered 2015-09-15 (×2): 3 [IU] via SUBCUTANEOUS
  Administered 2015-09-16: 2 [IU] via SUBCUTANEOUS
  Administered 2015-09-16: 3 [IU] via SUBCUTANEOUS

## 2015-09-14 MED ORDER — ALBUTEROL SULFATE (2.5 MG/3ML) 0.083% IN NEBU: 3.0000 mL | INHALATION_SOLUTION | Freq: Four times a day (QID) | RESPIRATORY_TRACT | Status: DC | PRN

## 2015-09-14 MED ORDER — GABAPENTIN 400 MG PO CAPS
400.0000 mg | ORAL_CAPSULE | Freq: Three times a day (TID) | ORAL | Status: DC
  Administered 2015-09-14 – 2015-09-16 (×8): 400 mg via ORAL
  Filled 2015-09-14 (×8): qty 1

## 2015-09-14 MED ORDER — HEPARIN SODIUM (PORCINE) 5000 UNIT/ML IJ SOLN
5000.0000 [IU] | Freq: Three times a day (TID) | INTRAMUSCULAR | Status: DC
  Administered 2015-09-14 – 2015-09-15 (×4): 5000 [IU] via SUBCUTANEOUS
  Filled 2015-09-14 (×5): qty 1

## 2015-09-14 MED ORDER — ALPRAZOLAM 1 MG PO TABS
1.0000 mg | ORAL_TABLET | Freq: Three times a day (TID) | ORAL | Status: DC | PRN
  Administered 2015-09-14 – 2015-09-16 (×7): 1 mg via ORAL
  Filled 2015-09-14 (×8): qty 1

## 2015-09-14 MED ORDER — OXYCODONE HCL 5 MG PO TABS
30.0000 mg | ORAL_TABLET | ORAL | Status: DC | PRN
Start: 2015-09-14 — End: 2015-09-17
  Administered 2015-09-14 – 2015-09-16 (×13): 30 mg via ORAL
  Filled 2015-09-14 (×14): qty 6

## 2015-09-14 MED ORDER — CITALOPRAM HYDROBROMIDE 20 MG PO TABS
20.0000 mg | ORAL_TABLET | Freq: Every day | ORAL | Status: DC
  Administered 2015-09-14 – 2015-09-16 (×3): 20 mg via ORAL
  Filled 2015-09-14 (×3): qty 1

## 2015-09-14 NOTE — Care Management Obs Status (Signed)
MEDICARE OBSERVATION STATUS NOTIFICATION   Patient Details  Name: Bryan Gallegos L Vukelich MRN: [id number] Date of Birth: 06/02/1975   Medicare Observation Status Notification Given:  Yes    Antony HasteBennett, Wilbert Schouten Harris, RN 09/14/2015, 1:50 PM

## 2015-09-14 NOTE — H&P (Signed)
History and Physical    Bryan Gallegos ZOX:[id number] DOB: [birth year] DOA: 09/13/2015   PCP: No PCP Per Patient Chief Complaint:  Chief Complaint  Patient presents with  . Drug Overdose    HPI: Bryan Gallegos is a 40 y.o. male with medical history significant of DM, HTN Bipolar Disorder, adult ADHD, and transient cerebral ischemia brought in by EMS who presents to the Emergency Department complaining of drug overdose onset 2 hours ago. Pt reports ingesting 7 0.1 mg Clonodine, 15 Lexapro 40 mg, 30 Simvastatin 20 mg at 9 PM. Pt was seen on 09/03/15 for SI and was hospitalized at [facility]. Pt states that he is tired of hurting, stress and depression. He expresses SI this evening, worsening after discharge from [facility]. He reports that he did not have a way to the hospital when he needed help. He states that he is hurting from slipped discs in his back and chronic knee pain, but is "too big" for surgery. Pt is okay with going back to [facility]. Poison Control has been called and notified. He denies nausea or vomiting.  ED Course: Psn control center recs observation on tele monitor for QTC prolongation.  Review of Systems: As per HPI otherwise 10 point review of systems negative.    Past Medical History  Diagnosis Date  . Heart valve disorder     s/p echocardiogram  . Hypertension   . Asthma   . Diabetes mellitus   . Hyperlipidemia   . Anaphylactic reaction   . Bipolar disorder (HCC)   . Adult ADHD   . Morbidly obese (HCC)   . Transient cerebral ischemia     Unknown  . OSA (obstructive sleep apnea)   . Anxiety   . Complication of anesthesia     Per patient difficult intubation;  . Difficult intubation     Per patient    Past Surgical History  Procedure Laterality Date  . Carpal tunnel release    . Nose surgery    . Wisdom tooth extraction    . Tooth extraction    . Esophagogastroduodenoscopy N/A 04/05/2015    Procedure:  ESOPHAGOGASTRODUODENOSCOPY (EGD);  Surgeon: Malissa Hippo, MD;  [facility];  Service: Endoscopy;  Laterality: N/A;  . Esophagogastroduodenoscopy (egd) with propofol N/A 05/21/2015    Procedure: ESOPHAGOGASTRODUODENOSCOPY (EGD) WITH PROPOFOL;  Surgeon: Meryl Dare, MD;  [facility];  Service: Endoscopy;  Laterality: N/A;     reports that he has been smoking Cigarettes.  He has a 11.5 pack-year smoking history. He has never used smokeless tobacco. He reports that he does not drink alcohol or use illicit drugs.  Allergies  Allergen Reactions  . Atenolol Anaphylaxis  . Other Anaphylaxis    "Prednisone like drugs"  . Tylenol [Acetaminophen] Other (See Comments)    GI Bleed    Family History  Problem Relation Age of Onset  . CAD Mother     Living  . Diabetes Mellitus II Mother   . Stroke Mother   . Hypertension Mother   . Congestive Heart Failure Mother   . Kidney disease Mother   . Fibromyalgia Mother   . Thyroid disease Mother   . Hyperlipidemia Mother   . Liver disease Mother   . Alcoholism Father 13    Deceased  . Arthritis Maternal Grandmother   . Congestive Heart Failure Maternal Grandmother   . Hypertension Maternal Grandmother   . Lung cancer Maternal Grandfather   . Colon cancer Maternal  Aunt   . Stomach cancer Maternal Aunt   . Heart disease Other     Paternal & Maternal  . Crohn's disease Other   . Hypertension Other     Paternal & Maternal  . Hypertension Brother     x3  . Hypertension Sister     #1  . Bipolar disorder Sister     #1  . ADD / ADHD Son     x3  . Bipolar disorder Son     x3  . Asperger's syndrome Son      Prior to Admission medications   Medication Sig Start Date End Date Taking? Authorizing Provider  albuterol (PROVENTIL HFA;VENTOLIN HFA) 108 (90 Base) MCG/ACT inhaler Inhale 2 puffs into the lungs every 6 (six) hours as needed for wheezing or shortness of breath. 09/05/15  Yes Sanjuana Kava, NP  alprazolam  Prudy Feeler) 2 MG tablet Take 2 mg by mouth 3 (three) times daily as needed for anxiety. For anxiety 09/09/15  Yes Historical Provider, MD  amphetamine-dextroamphetamine (ADDERALL) 30 MG tablet Take 30 mg by mouth daily.   Yes Historical Provider, MD  aspirin 325 MG tablet Take 1 tablet (325 mg total) by mouth daily. For heart health 09/05/15  Yes Sanjuana Kava, NP  citalopram (CELEXA) 10 MG tablet Take 1 tablet (10 mg total) by mouth daily. For depression 09/05/15  Yes Sanjuana Kava, NP  furosemide (LASIX) 20 MG tablet Take 1 tablet (20 mg total) by mouth daily. For swelling 09/05/15  Yes Sanjuana Kava, NP  gabapentin (NEURONTIN) 400 MG capsule Take 2 capsules (800 mg total) by mouth 3 (three) times daily. For agitation 09/05/15  Yes Sanjuana Kava, NP  hydrOXYzine (VISTARIL) 50 MG capsule Take 1 capsule (50 mg total) by mouth 3 (three) times daily as needed for anxiety. 09/05/15  Yes Sanjuana Kava, NP  insulin NPH Human (HUMULIN N) 100 UNIT/ML injection Inject 0.01-0.04 mLs (1-4 Units total) into the skin daily before breakfast. For diabetes 09/05/15  Yes Sanjuana Kava, NP  lisinopril (PRINIVIL,ZESTRIL) 40 MG tablet Take 1 tablet (40 mg total) by mouth 2 (two) times daily. For high blood pressure 09/05/15  Yes Sanjuana Kava, NP  ondansetron (ZOFRAN) 8 MG tablet Take 1 tablet (8 mg total) by mouth 4 (four) times daily -  before meals and at bedtime. Nausea 09/05/15  Yes Sanjuana Kava, NP  oxycodone (ROXICODONE) 30 MG immediate release tablet Take 30 mg by mouth every 4 (four) hours as needed for pain.   Yes Historical Provider, MD  sitaGLIPtin-metformin (JANUMET) 50-1000 MG tablet Take 1 tablet by mouth 2 (two) times daily with a meal. For diabetes 09/05/15  Yes Sanjuana Kava, NP  traZODone (DESYREL) 50 MG tablet Take 1 tablet (50 mg total) by mouth at bedtime. For sleep 09/05/15  Yes Sanjuana Kava, NP  dexlansoprazole (DEXILANT) 60 MG capsule Take 1 capsule (60 mg total) by mouth daily. For acid reflux Patient taking  differently: Take 60 mg by mouth daily as needed. For acid reflux 09/05/15   Sanjuana Kava, NP    Physical Exam: Filed Vitals:   09/13/15 2259 09/14/15 0012 09/14/15 0130 09/14/15 0200  BP: 147/79 131/73 105/57 115/68  Pulse: 80 72 70 73  Temp: 98 F (36.7 C)     TempSrc: Oral     Resp: 17 18 17 18   SpO2: 97% 98% 95% 94%      Constitutional: NAD, calm, comfortable Eyes: PERRL, lids  and conjunctivae normal ENMT: Mucous membranes are moist. Posterior pharynx clear of any exudate or lesions.Normal dentition.  Neck: normal, supple, no masses, no thyromegaly Respiratory: clear to auscultation bilaterally, no wheezing, no crackles. Normal respiratory effort. No accessory muscle use.  Cardiovascular: Regular rate and rhythm, no murmurs / rubs / gallops. No extremity edema. 2+ pedal pulses. No carotid bruits.  Abdomen: no tenderness, no masses palpated. No hepatosplenomegaly. Bowel sounds positive.  Musculoskeletal: no clubbing / cyanosis. No joint deformity upper and lower extremities. Good ROM, no contractures. Normal muscle tone.  Skin: no rashes, lesions, ulcers. No induration Neurologic: CN 2-12 grossly intact. Sensation intact, DTR normal. Strength 5/5 in all 4.  Psychiatric: Normal judgment and insight. Alert and oriented x 3. Normal mood.    Labs on Admission: I have personally reviewed following labs and imaging studies  CBC:  Recent Labs Lab 09/14/15 0111  WBC 11.2*  HGB 14.4  HCT 42.0  MCV 90.1  PLT 226   Basic Metabolic Panel:  Recent Labs Lab 09/14/15 0111  NA 136  K 4.0  CL 103  CO2 27  GLUCOSE 129*  BUN 10  CREATININE 0.73  CALCIUM 8.7*   GFR: Estimated Creatinine Clearance: 200.1 mL/min (by C-G formula based on Cr of 0.73). Liver Function Tests:  Recent Labs Lab 09/14/15 0111  AST 44*  ALT 55  ALKPHOS 67  BILITOT 1.1  PROT 7.6  ALBUMIN 3.3*   No results for input(s): LIPASE, AMYLASE in the last 168 hours. No results for input(s): AMMONIA  in the last 168 hours. Coagulation Profile: No results for input(s): INR, PROTIME in the last 168 hours. Cardiac Enzymes: No results for input(s): CKTOTAL, CKMB, CKMBINDEX, TROPONINI in the last 168 hours. BNP (last 3 results) No results for input(s): PROBNP in the last 8760 hours. HbA1C: No results for input(s): HGBA1C in the last 72 hours. CBG:  Recent Labs Lab 09/14/15 0101  GLUCAP 139*   Lipid Profile: No results for input(s): CHOL, HDL, LDLCALC, TRIG, CHOLHDL, LDLDIRECT in the last 72 hours. Thyroid Function Tests: No results for input(s): TSH, T4TOTAL, FREET4, T3FREE, THYROIDAB in the last 72 hours. Anemia Panel: No results for input(s): VITAMINB12, FOLATE, FERRITIN, TIBC, IRON, RETICCTPCT in the last 72 hours. Urine analysis:    Component Value Date/Time   COLORURINE YELLOW 08/20/2015 2320   APPEARANCEUR CLEAR 08/20/2015 2320   LABSPEC 1.023 08/20/2015 2320   PHURINE 6.5 08/20/2015 2320   GLUCOSEU NEGATIVE 08/20/2015 2320   HGBUR NEGATIVE 08/20/2015 2320   BILIRUBINUR NEGATIVE 08/20/2015 2320   KETONESUR NEGATIVE 08/20/2015 2320   PROTEINUR NEGATIVE 08/20/2015 2320   UROBILINOGEN 1.0 08/18/2014 2002   NITRITE NEGATIVE 08/20/2015 2320   LEUKOCYTESUR NEGATIVE 08/20/2015 2320   Sepsis Labs: @LABRCNTIP (procalcitonin:4,lacticidven:4) )No results found for this or any previous visit (from the past 240 hour(s)).   Radiological Exams on Admission: No results found.  EKG: Independently reviewed.  Assessment/Plan Active Problems:   OD (overdose of drug)    OD -  Tele obs  Although he is telling me what he ODed on.  For the moment at least I dont really feel that I cant trust that he didn't overdose on any or all of his other meds and feel that I must hold these as well out of caution.  Watch for signs and symptoms of benzo withdrawal (hopefully we can restart this before becomes an issue).  Call psych in AM  DM -  Holding home meds  SSI AC/HS   DVT  prophylaxis: lovenox Code Status: Full Family Communication: No family in room Consults called: None Admission status: Admit to obs   Jemila Camille, Heywood Iles DO [facility] Pager [phone] from 7PM-7AM  If 7AM-7PM, please contact the day physician for the patient [email or web address] Password [facility]  09/14/2015, 2:44 AM

## 2015-09-14 NOTE — Progress Notes (Signed)
No charge note.   Pt admitted early in AM after drug overdose. He is frustrated with chronic pain and family issues. He tells me he basically overdosed in an attempt to get way from family, he says he's not suicidal.  Cont telemetry monitoring. Psych consulted. Resume home medications including Xanax, Gabapentin and Oxycodone given he is calm, cooperative and he takes these medications chronically. Will restart them at lower doses.

## 2015-09-14 NOTE — Consult Note (Signed)
[name] Consult   Reason for Consult:  Overdosed on medications anxious Referring Physician:  Hollice Gong Patient Identification: Bryan Gallegos MRN:  [id number] Principal Diagnosis: OD (overdose of drug) Diagnosis:   Patient Active Problem List   Diagnosis Date Noted  . OD (overdose of drug) [T50.901A] 09/14/2015    Priority: High  . Major depressive disorder, recurrent severe without psychotic features ([place]) [F33.2] 09/04/2015    Priority: Medium  . Polysubstance dependence including opioid type drug, episodic abuse ([place]) [F11.20, F19.20] 09/04/2015  . Left-sided weakness [M62.89] 08/21/2015  . Vomiting [R11.10] 08/21/2015  . Epigastric pain [R10.13]   . RUQ abdominal pain [R10.11]   . Intractable vomiting [R11.10] 05/17/2015  . Essential hypertension [I10] 05/17/2015  . Abdominal pain [R10.9] 05/17/2015  . Cholelithiasis [K80.20] 05/17/2015  . 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 Antisocial personality disorder [F60.2] 04/11/2015  . Opioid use disorder, severe, dependence ([place]) [F11.20] 04/11/2015  . Benzodiazepine dependence ([place]) [F13.20] 04/11/2015  . Opioid overdose [T40.2X1A] 04/11/2015  . Overdose [T50.901A] 04/11/2015  . Anoxic-ischemic encephalopathy ([place]) [G93.1, I67.82] 04/11/2015  . Altered mental status [R41.82]   . Hypoxic brain injury ([place]) [G93.1]   . HTN (hypertension) [I10] 04/10/2015  . Tobacco use disorder [F17.200] 04/10/2015  . Narcotic dependency, continuous ([place] [F11.20] 03/14/2014  . Chronic back pain greater than 3 months duration [M54.9, G89.29] 01/28/2014  . Gastroesophageal reflux disease with esophagitis [K21.0] 01/28/2014  . Essential hypertension, benign [I10] 01/28/2014  . Asthma, mild persistent [J45.30] 01/28/2014  . Diabetes mellitus type II, controlled ([place]) [E11.9] 01/28/2014  . Morbid obesity ([place]) [E66.01] 01/28/2014  . TIA (transient ischemic attack) [G45.9] 09/02/2013  . HLD (hyperlipidemia) [E78.5] 08/06/2013   . OSA (obstructive sleep apnea) [G47.33] 08/06/2013    Total Time spent with patient: 1 hour  Subjective:   Bryan Gallegos is a 40 y.o. male patient admitted with suicide attempt by overdose.  HPI: Thank you for asking me to do a psychiatric consultation on Bryan Gallegos, a 40 y.o. Male with a hx of HTN, DM, Bipolar Disorder, adult ADHD, and transient cerebral ischemia who reports that he overdosed on 7 tablets of  0.1 mg Clonidine, 15 Lexapro 40 mg, 30 Simvastatin 20 mg at 9 PM. Pt was seen on 09/03/15 for SI and was hospitalized at [facility] discharged few days ago. Pt states that he attempted suicide just to get away from his mother who has been stressing him out. He continue expresses SI, feeling depressed and unable to contract for safety  Past Psychiatric History: as above  Risk to Self: Is patient at risk for suicide?: Yes Risk to Others:   Prior Inpatient Therapy:   Prior Outpatient Therapy:    Past Medical History:  Past Medical History  Diagnosis Date  . Heart valve disorder     s/p echocardiogram  . Hypertension   . Asthma   . Diabetes mellitus   . Hyperlipidemia   . Anaphylactic reaction   . Bipolar disorder ([place])   . Adult ADHD   . Morbidly obese ([place])   . Transient cerebral ischemia     Unknown  . OSA (obstructive sleep apnea)   . Anxiety   . Complication of anesthesia     Per patient difficult intubation;  . Difficult intubation     Per patient    Past Surgical History  Procedure Laterality Date  . Carpal tunnel release    . Nose surgery    . Wisdom tooth extraction    .  Tooth extraction    . Esophagogastroduodenoscopy N/A 04/05/2015    Procedure: ESOPHAGOGASTRODUODENOSCOPY (EGD);  Surgeon: Rogene Houston, MD;  Location: [facility];  Service: Endoscopy;  Laterality: N/A;  . Esophagogastroduodenoscopy (egd) with propofol N/A 05/21/2015    Procedure: ESOPHAGOGASTRODUODENOSCOPY (EGD) WITH PROPOFOL;  Surgeon: Ladene Artist, MD;   Location: [facility] ENDOSCOPY;  Service: Endoscopy;  Laterality: N/A;   Family History:  Family History  Problem Relation Age of Onset  . CAD Mother     Living  . Diabetes Mellitus II Mother   . Stroke Mother   . Hypertension Mother   . Congestive Heart Failure Mother   . Kidney disease Mother   . Fibromyalgia Mother   . Thyroid disease Mother   . Hyperlipidemia Mother   . Liver disease Mother   . Alcoholism Father 40    Deceased  . Arthritis Maternal Grandmother   . Congestive Heart Failure Maternal Grandmother   . Hypertension Maternal Grandmother   . Lung cancer Maternal Grandfather   . Colon cancer Maternal Aunt   . Stomach cancer Maternal Aunt   . Heart disease Other     Paternal & Maternal  . Crohn's disease Other   . Hypertension Other     Paternal & Maternal  . Hypertension Brother     x3  . Hypertension Sister     #1  . Bipolar disorder Sister     #1  . ADD / ADHD Son     x3  . Bipolar disorder Son     x3  . Asperger's syndrome Son    Family Psychiatric  History:  Social History:  History  Alcohol Use No     History  Drug Use No    Social History   Social History  . Marital Status: Single    Spouse Name: N/A  . Number of Children: N/A  . Years of Education: N/A   Social History Main Topics  . Smoking status: Current Every Day Smoker -- 0.50 packs/day for 23 years    Types: Cigarettes  . Smokeless tobacco: Never Used  . Alcohol Use: No  . Drug Use: No  . Sexual Activity:    Partners: Female    Museum/gallery curator: None   Other Topics Concern  . None   Social History Narrative   Additional Social History:    Allergies:   Allergies  Allergen Reactions  . Atenolol Anaphylaxis  . Other Anaphylaxis    "Prednisone like drugs"  . Tylenol [Acetaminophen] Other (See Comments)    GI Bleed    Labs:  Results for orders placed or performed during the hospital encounter of 09/13/15 (from the past 48 hour(s))  Rapid urine drug screen  (hospital performed)     Status: Abnormal   Collection Time: 09/14/15 12:13 AM  Result Value Ref Range   Opiates POSITIVE (A) NONE DETECTED   Cocaine NONE DETECTED NONE DETECTED   Benzodiazepines POSITIVE (A) NONE DETECTED   Amphetamines NONE DETECTED NONE DETECTED   Tetrahydrocannabinol NONE DETECTED NONE DETECTED   Barbiturates NONE DETECTED NONE DETECTED    Comment:        DRUG SCREEN FOR MEDICAL PURPOSES ONLY.  IF CONFIRMATION IS NEEDED FOR ANY PURPOSE, NOTIFY LAB WITHIN 5 DAYS.        LOWEST DETECTABLE LIMITS FOR URINE DRUG SCREEN Drug Class       Cutoff (ng/mL) Amphetamine      1000 Barbiturate      200 Benzodiazepine  893 Tricyclics       810 Opiates          300 Cocaine          300 THC              50   CBG monitoring, ED     Status: Abnormal   Collection Time: 09/14/15  1:01 AM  Result Value Ref Range   Glucose-Capillary 139 (H) 65 - 99 mg/dL  Comprehensive metabolic panel     Status: Abnormal   Collection Time: 09/14/15  1:11 AM  Result Value Ref Range   Sodium 136 135 - 145 mmol/L   Potassium 4.0 3.5 - 5.1 mmol/L   Chloride 103 101 - 111 mmol/L   CO2 27 22 - 32 mmol/L   Glucose, Bld 129 (H) 65 - 99 mg/dL   BUN 10 6 - 20 mg/dL   Creatinine, Ser 0.73 0.61 - 1.24 mg/dL   Calcium 8.7 (L) 8.9 - 10.3 mg/dL   Total Protein 7.6 6.5 - 8.1 g/dL   Albumin 3.3 (L) 3.5 - 5.0 g/dL   AST 44 (H) 15 - 41 U/L   ALT 55 17 - 63 U/L   Alkaline Phosphatase 67 38 - 126 U/L   Total Bilirubin 1.1 0.3 - 1.2 mg/dL   GFR calc non Af Amer >60 >60 mL/min   GFR calc Af Amer >60 >60 mL/min    Comment: (NOTE) The eGFR has been calculated using the CKD EPI equation. This calculation has not been validated in all clinical situations. eGFR's persistently <60 mL/min signify possible Chronic Kidney Disease.    Anion gap 6 5 - 15  Ethanol     Status: None   Collection Time: 09/14/15  1:11 AM  Result Value Ref Range   Alcohol, Ethyl (B) <5 <5 mg/dL    Comment:        LOWEST  DETECTABLE LIMIT FOR SERUM ALCOHOL IS 5 mg/dL FOR MEDICAL PURPOSES ONLY   Salicylate level     Status: None   Collection Time: 09/14/15  1:11 AM  Result Value Ref Range   Salicylate Lvl <1.7 2.8 - 30.0 mg/dL  Acetaminophen level     Status: Abnormal   Collection Time: 09/14/15  1:11 AM  Result Value Ref Range   Acetaminophen (Tylenol), Serum <10 (L) 10 - 30 ug/mL    Comment:        THERAPEUTIC CONCENTRATIONS VARY SIGNIFICANTLY. A RANGE OF 10-30 ug/mL MAY BE AN EFFECTIVE CONCENTRATION FOR MANY PATIENTS. HOWEVER, SOME ARE BEST TREATED AT CONCENTRATIONS OUTSIDE THIS RANGE. ACETAMINOPHEN CONCENTRATIONS >150 ug/mL AT 4 HOURS AFTER INGESTION AND >50 ug/mL AT 12 HOURS AFTER INGESTION ARE OFTEN ASSOCIATED WITH TOXIC REACTIONS.   cbc     Status: Abnormal   Collection Time: 09/14/15  1:11 AM  Result Value Ref Range   WBC 11.2 (H) 4.0 - 10.5 K/uL   RBC 4.66 4.22 - 5.81 MIL/uL   Hemoglobin 14.4 13.0 - 17.0 g/dL   HCT 42.0 39.0 - 52.0 %   MCV 90.1 78.0 - 100.0 fL   MCH 30.9 26.0 - 34.0 pg   MCHC 34.3 30.0 - 36.0 g/dL   RDW 12.8 11.5 - 15.5 %   Platelets 226 150 - 400 K/uL  Glucose, capillary     Status: Abnormal   Collection Time: 09/14/15  7:49 AM  Result Value Ref Range   Glucose-Capillary 174 (H) 65 - 99 mg/dL   Comment 1 Notify RN   Glucose,  capillary     Status: Abnormal   Collection Time: 09/14/15 11:50 AM  Result Value Ref Range   Glucose-Capillary 139 (H) 65 - 99 mg/dL   Comment 1 Notify RN     Current Facility-Administered Medications  Medication Dose Route Frequency Provider Last Rate Last Dose  . albuterol (PROVENTIL) (2.5 MG/3ML) 0.083% nebulizer solution 3 mL  3 mL Inhalation Q6H PRN Etta Quill, DO      . ALPRAZolam Duanne Moron) tablet 1 mg  1 mg Oral TID PRN Mir Marry Guan, MD   1 mg at 09/14/15 1049  . citalopram (CELEXA) tablet 20 mg  20 mg Oral Daily Mackinsey Pelland, MD      . gabapentin (NEURONTIN) capsule 400 mg  400 mg Oral TID Mir Marry Guan, MD   400 mg at 09/14/15 1049  . heparin injection 5,000 Units  5,000 Units Subcutaneous Q8H Etta Quill, DO   5,000 Units at 09/14/15 0600  . insulin aspart (novoLOG) injection 0-15 Units  0-15 Units Subcutaneous TID WC Etta Quill, DO   2 Units at 09/14/15 1200  . oxyCODONE (Oxy IR/ROXICODONE) immediate release tablet 30 mg  30 mg Oral Q4H PRN Mir Marry Guan, MD   30 mg at 09/14/15 1158  . sodium chloride flush (NS) 0.9 % injection 3 mL  3 mL Intravenous Q12H Etta Quill, DO   3 mL at 09/14/15 1000    Musculoskeletal: Strength & Muscle Tone: within normal limits Gait & Station: normal Patient leans: N/A  Psychiatric Specialty Exam: Physical Exam  Psychiatric: His speech is normal. His mood appears anxious. He is slowed and withdrawn. Cognition and memory are normal. He expresses impulsivity. He exhibits a depressed mood. He expresses suicidal ideation.    Review of Systems  Constitutional: Negative.   HENT: Negative.   Eyes: Negative.   Respiratory: Negative.   Cardiovascular: Negative.   Gastrointestinal: Negative.   Genitourinary: Negative.   Skin: Negative.   Endo/Heme/Allergies: Negative.   Psychiatric/Behavioral: Positive for depression, suicidal ideas and substance abuse. The patient is nervous/anxious.     Blood pressure 110/88, pulse 71, temperature 97.5 F (36.4 C), temperature source Oral, resp. rate 16, height '6\' 2"'$  (1.88 m), weight 173.2 kg (381 lb 13.4 oz), SpO2 99 %.Body mass index is 49 kg/(m^2).  General Appearance: Casual  Eye Contact:  Minimal  Speech:  Clear and Coherent  Volume:  Decreased  Mood:  Anxious, Depressed and Dysphoric  Affect:  Constricted  Thought Process:  Coherent and Descriptions of Associations: Intact  Orientation:  Full (Time, Place, and Person)  Thought Content:  Logical  Suicidal Thoughts:  Yes.  without intent/plan  Homicidal Thoughts:  No  Memory:  Immediate;   Fair Recent;   Good Remote;   Good   Judgement:  Impaired  Insight:  Lacking  Psychomotor Activity:  Psychomotor Retardation  Concentration:  Concentration: Good and Attention Span: Fair  Recall:  Good  Fund of Knowledge:  Good  Language:  Good  Akathisia:  No  Handed:  Right  AIMS (if indicated):     Assets:  Communication Skills Desire for Improvement Physical Health  ADL's:  Intact  Cognition:  WNL  Sleep:   fair     Treatment Plan Summary: -Crisis -Daily contact with patient to assess and evaluate symptoms and progress in treatment, Medication management. -Increase Celexa to '20mg'$  daily for depression. -Continue Gabapentin '400mg'$  tid for mood stabilization and anxiety  Disposition: Recommend psychiatric Inpatient admission when medically  cleared. Supportive therapy provided about ongoing stressors. unit social worker to assit with psychiatric inpatient admission  Corena Pilgrim, MD 09/14/2015 1:30 PM

## 2015-09-14 NOTE — Progress Notes (Signed)
Received call from poison control center; gave update on pt including results of tonights EKG and VS. They stated they would close things out on their end and to call back if needed. Mick SellShannon Scott Vanderveer RN

## 2015-09-15 DIAGNOSIS — T50902A Poisoning by unspecified drugs, medicaments and biological substances, intentional self-harm, initial encounter: Secondary | ICD-10-CM | POA: Diagnosis not present

## 2015-09-15 LAB — GLUCOSE, CAPILLARY
GLUCOSE-CAPILLARY: 133 mg/dL — AB (ref 65–99)
GLUCOSE-CAPILLARY: 153 mg/dL — AB (ref 65–99)
GLUCOSE-CAPILLARY: 166 mg/dL — AB (ref 65–99)
Glucose-Capillary: 162 mg/dL — ABNORMAL HIGH (ref 65–99)
Glucose-Capillary: 112 mg/dL — ABNORMAL HIGH (ref 65–99)

## 2015-09-15 MED ORDER — LORAZEPAM 2 MG/ML IJ SOLN
1.0000 mg | INTRAMUSCULAR | Status: DC | PRN
  Administered 2015-09-15 (×2): 1 mg via INTRAVENOUS
  Filled 2015-09-15 (×3): qty 1

## 2015-09-15 MED ORDER — TRAZODONE HCL 50 MG PO TABS
50.0000 mg | ORAL_TABLET | Freq: Once | ORAL | Status: AC
  Administered 2015-09-15: 50 mg via ORAL
  Filled 2015-09-15: qty 1

## 2015-09-15 NOTE — Progress Notes (Signed)
PROGRESS NOTE  Bryan Gallegos:[id number] DOB: 14-May-1975 DOA: 09/13/2015 PCP: No PCP Per Patient  HPI/Recap of past 24 hours: Bryan Gallegos is a 40 y.o. male with medical history significant of DM, HTN Bipolar Disorder, adult ADHD, and transient cerebral ischemia brought in by EMS who presents to the Emergency Department complaining of drug overdose onset 2 hours ago. Pt reports ingesting 7 0.1 mg Clonodine, 15 Lexapro 40 mg, 30 Simvastatin 20 mg at 9 PM.   In the last 24 hours there have been no acute events. EKG was rechecked last night and patient has been cleared by poison control. This AM he is resting comfortably and denies pain, nausea. Later this PM he became very upset and security was called after he was told he cannot leave the hospital and must be admitted to behavioral health hospital.  Assessment/Plan: OD (overdose of drug) Tele obs Most home meds were initially held, he is not tolerating resumption of his home medications. Watch for signs and symptoms of benzo withdrawal (home meds have been restarted).  Seen by psych, while pt told me he is not suicidal, psych notes that he mentions ongoing depression, suicidal thoughts and cannot contract for safety. As such, patient is under IVC psyc hold and is awaiting bed at [facility].  DM - Holding home meds SSI AC/HS  Code Status: FULL    Family Communication: None present.   Disposition Plan: He will be discharged to inpatient psych unit when bed is available.    Consultants:  Psych   Procedures:  None   Antimicrobials:  None    Objective: Filed Vitals:   09/14/15 1409 09/14/15 1845 09/14/15 2222 09/15/15 0452  BP: 143/80 145/69 129/71 132/86  Pulse: 68 62 62 109  Temp: 98 F (36.7 C)  97.4 F (36.3 C) 98.7 F (37.1 C)  TempSrc: Oral  Oral Oral  Resp: Height:      Weight:      SpO2: 98%  98% 91%    Intake/Output Summary (Last  24 hours) at 09/15/15 1538 Last data filed at 09/14/15 1734  Gross per 24 hour  Intake    360 ml  Output      0 ml  Net    360 ml   Filed Weights   09/14/15 0345  Weight: 173.2 kg (381 lb 13.4 oz)    Exam: General:  Alert, oriented, calm, in no acute distress Eyes: pupils round and reactive to light and accomodation, clear sclerea Neck: supple, no masses, trachea mildline  Cardiovascular: RRR, no murmurs or rubs, no peripheral edema  Respiratory: clear to auscultation bilaterally, no wheezes, no crackles  Abdomen: soft, nontender, nondistended, normal bowel tones heard  Skin: dry, no rashes  Musculoskeletal: no joint effusions, normal range of motion  Psychiatric: appropriate affect, normal speech  Neurologic: extraocular muscles intact, clear speech, moving all extremities with intact sensorium    Data Reviewed: CBC:  Recent Labs Lab 09/14/15 0111  WBC 11.2*  HGB 14.4  HCT 42.0  MCV 90.1  PLT 226   Basic Metabolic Panel:  Recent Labs Lab 09/14/15 0111  NA 136  K 4.0  CL 103  CO2 27  GLUCOSE 129*  BUN 10  CREATININE 0.73  CALCIUM 8.7*   GFR: Estimated Creatinine Clearance: 203.8 mL/min (by C-G formula based on Cr of 0.73). Liver Function Tests:  Recent Labs Lab 09/14/15 0111  AST 44*  ALT 55  ALKPHOS 67  BILITOT 1.1  PROT 7.6  ALBUMIN 3.3*   No results for input(s): LIPASE, AMYLASE in the last 168 hours. No results for input(s): AMMONIA in the last 168 hours. Coagulation Profile: No results for input(s): INR, PROTIME in the last 168 hours. Cardiac Enzymes: No results for input(s): CKTOTAL, CKMB, CKMBINDEX, TROPONINI in the last 168 hours. BNP (last 3 results) No results for input(s): PROBNP in the last 8760 hours. HbA1C: No results for input(s): HGBA1C in the last 72 hours. CBG:  Recent Labs Lab 09/14/15 0749 09/14/15 1150 09/14/15 1704 09/15/15 0005 09/15/15 0729  GLUCAP 174* 139* 127* 133* 153*   Lipid Profile: No results for  input(s): CHOL, HDL, LDLCALC, TRIG, CHOLHDL, LDLDIRECT in the last 72 hours. Thyroid Function Tests: No results for input(s): TSH, T4TOTAL, FREET4, T3FREE, THYROIDAB in the last 72 hours. Anemia Panel: No results for input(s): VITAMINB12, FOLATE, FERRITIN, TIBC, IRON, RETICCTPCT in the last 72 hours. Urine analysis:    Component Value Date/Time   COLORURINE YELLOW 08/20/2015 2320   APPEARANCEUR CLEAR 08/20/2015 2320   LABSPEC 1.023 08/20/2015 2320   PHURINE 6.5 08/20/2015 2320   GLUCOSEU NEGATIVE 08/20/2015 2320   HGBUR NEGATIVE 08/20/2015 2320   BILIRUBINUR NEGATIVE 08/20/2015 2320   KETONESUR NEGATIVE 08/20/2015 2320   PROTEINUR NEGATIVE 08/20/2015 2320   UROBILINOGEN 1.0 08/18/2014 2002   NITRITE NEGATIVE 08/20/2015 2320   LEUKOCYTESUR NEGATIVE 08/20/2015 2320   Sepsis Labs: @LABRCNTIP (procalcitonin:4,lacticidven:4)  )No results found for this or any previous visit (from the past 240 hour(s)).    Studies: No results found.  Scheduled Meds: . citalopram  20 mg Oral Daily  . gabapentin  400 mg Oral TID  . heparin  5,000 Units Subcutaneous Q8H  . insulin aspart  0-15 Units Subcutaneous TID WC  . sodium chloride flush  3 mL Intravenous Q12H    Continuous Infusions:      Time spent: 21 minutes  Rula Keniston Vergie LivingMohammed Rafaelita Foister, MD [facility] Pager [phone]  If 7PM-7AM, please contact night-coverage [email or web address] Password [email or web address] 09/15/2015, 3:38 PM

## 2015-09-15 NOTE — Care Management Note (Signed)
Case Management Note  Patient Details  Name: Hennie DuosMitchell L Mckeehan MRN: [id number] Date of Birth: 05/06/1975  Subjective/Objective:  40 y/o OD. From home. Psych-IP Psych.Psych SW following for IP Psych.                  Action/Plan:d/c IP Psych.   Expected Discharge Date:                  Expected Discharge Plan:  Psychiatric Hospital  In-House Referral:     Discharge planning Services  CM Consult  Post Acute Care Choice:    Choice offered to:     DME Arranged:    DME Agency:     HH Arranged:    HH Agency:     Status of Service:  Completed, signed off  If discussed at [employer] Length of Stay Meetings, dates discussed:    Additional Comments:  Lanier ClamMahabir, Parmvir Boomer, RN 09/15/2015, 12:46 PM

## 2015-09-15 NOTE — Progress Notes (Addendum)
Patient attempted to leave unit, patient reports frustration and wanting to be with wife that is homeless. Patient agitated and upset. Receptive to speak with LCSWA and officers, pt. calm at the time.  Papers Served for IVC.  Patient reports he will attempt to leave again, LCSWA and Officer educated and inform about procedure for leaving under IVC. Spoke with RN about patient requested medication.   Vivi BarrackNicole Wendle Kina, Theresia MajorsLCSWA, MSW Clinical Social Worker 5E and Psychiatric Service Line [phone] 09/15/2015  5:41 PM

## 2015-09-15 NOTE — Progress Notes (Addendum)
LCSWA met with patient ar bedside completed assessment.  LCSWA faxed clinical referrals to inpatient [facility]: Patient is involuntary at this time.   [facility]: No beds. AC will inform once bed available. [facility]: No beds.AC will inform once bed available. [facility]: No beds. AC will inform once bed available. [facility]: No beds. AC will inform once bed available. [facility]: No beds. AC will inform once bed available.  Kathrin Greathouse, Latanya Presser, MSW Clinical Social Worker 5E and Psychiatric Service Line [phone] 09/15/2015  12:58 PM

## 2015-09-16 DIAGNOSIS — T50902A Poisoning by unspecified drugs, medicaments and biological substances, intentional self-harm, initial encounter: Secondary | ICD-10-CM | POA: Diagnosis not present

## 2015-09-16 LAB — GLUCOSE, CAPILLARY
Glucose-Capillary: 182 mg/dL — ABNORMAL HIGH (ref 65–99)
Glucose-Capillary: 142 mg/dL — ABNORMAL HIGH (ref 65–99)
Glucose-Capillary: 94 mg/dL (ref 65–99)

## 2015-09-16 NOTE — Progress Notes (Signed)
Pt refuses to wear any non slip socks and refuses to follow suicide protocol and put on paper scrubs. Bryan SicksK. Bryan Markwood RN

## 2015-09-16 NOTE — Progress Notes (Signed)
[facility] [place] [facility]. Julio SicksK. Karen Huhta RN

## 2015-09-16 NOTE — Discharge Summary (Signed)
Discharge Summary  Bryan Gallegos WUJ:[id number] DOB: 08/21/1975  PCP: No PCP Per Patient  Admit date: 09/13/2015 Discharge date: 09/16/2015   Recommendations for Outpatient Follow-up:  1. Psych inpatient.   Discharge Diagnoses:  Active Hospital Problems   Diagnosis Date Noted  . OD (overdose of drug) 09/14/2015  . Major depressive disorder, recurrent severe without psychotic features (HCC) 09/04/2015    Resolved Hospital Problems   Diagnosis Date Noted Date Resolved  No resolved problems to display.    Discharge Condition: Stable   Diet recommendation: Diabetic  Filed Vitals:   09/15/15 2120 09/16/15 0537  BP: 132/74 142/72  Pulse: 63 68  Temp:  97.5 F (36.4 C)  Resp:  20    History of present illness:  Bryan Gallegos is a 40 y.o. male with medical history significant of DM, HTN Bipolar Disorder, adult ADHD, and transient cerebral ischemia brought in by EMS who presents to the Emergency Department complaining of drug overdose onset 2 hours ago. Pt reports ingesting 7 0.1 mg Clonodine, 15 Lexapro 40 mg, 30 Simvastatin 20 mg at 9 PM.   In the last 24 hours there have been no acute events. EKG was rechecked last night and patient has been cleared by poison control. This AM he is resting comfortably and denies pain, nausea. He will discharge to inpatient psych today.  Hospital Course:  Principal Problem:   OD (overdose of drug) Active Problems:   Major depressive disorder, recurrent severe without psychotic features (HCC)  OD (overdose of drug) Tele obs Most home meds were initially held, he is not tolerating resumption of his home medications. Watch for signs and symptoms of benzo withdrawal (home meds have been restarted). Seen by psych, while pt told me he is not suicidal, psych notes that he mentions ongoing depression, suicidal thoughts and cannot contract for safety. As such, patient is under IVC psyc hold  and is going to inpatient psych today.  DM - Holding home meds SSI AC/HS  Procedures:  None   Consultations:  Psych   Discharge Exam: BP 142/72 mmHg  Pulse 68  Temp(Src) 97.5 F (36.4 C) (Oral)  Resp 20  Ht 6\' 2"  (1.88 m)  Wt 173.2 kg (381 lb 13.4 oz)  BMI 49.00 kg/m2  SpO2 97% General:  Alert, oriented, calm, in no acute distress  Eyes: pupils round and reactive to light and accomodation, clear sclerea Neck: supple, no masses, trachea mildline  Cardiovascular: RRR, no murmurs or rubs, no peripheral edema  Respiratory: clear to auscultation bilaterally, no wheezes, no crackles  Abdomen: soft, nontender, nondistended, normal bowel tones heard  Skin: dry, no rashes  Musculoskeletal: no joint effusions, normal range of motion  Psychiatric: appropriate affect, normal speech  Neurologic: extraocular muscles intact, clear speech, moving all extremities with intact sensorium    Discharge Instructions You were cared for by a hospitalist during your hospital stay. If you have any questions about your discharge medications or the care you received while you were in the hospital after you are discharged, you can call the unit and asked to speak with the hospitalist on call if the hospitalist that took care of you is not available. Once you are discharged, your primary care physician will handle any further medical issues. Please note that NO REFILLS for any discharge medications will be authorized once you are discharged, as it is imperative that you return to your primary care physician (or establish a relationship with a primary care physician if you do not  have one) for your aftercare needs so that they can reassess your need for medications and monitor your lab values.  Discharge Instructions    Diet - low sodium heart healthy    Complete by:  As directed      Increase activity slowly    Complete by:  As directed             Medication List    TAKE  these medications        albuterol 108 (90 Base) MCG/ACT inhaler  Commonly known as:  PROVENTIL HFA;VENTOLIN HFA  Inhale 2 puffs into the lungs every 6 (six) hours as needed for wheezing or shortness of breath.     alprazolam 2 MG tablet  Commonly known as:  XANAX  Take 2 mg by mouth 3 (three) times daily as needed for anxiety. For anxiety     amphetamine-dextroamphetamine 30 MG tablet  Commonly known as:  ADDERALL  Take 30 mg by mouth daily.     aspirin 325 MG tablet  Take 1 tablet (325 mg total) by mouth daily. For heart health     citalopram 10 MG tablet  Commonly known as:  CELEXA  Take 1 tablet (10 mg total) by mouth daily. For depression     dexlansoprazole 60 MG capsule  Commonly known as:  DEXILANT  Take 1 capsule (60 mg total) by mouth daily. For acid reflux     furosemide 20 MG tablet  Commonly known as:  LASIX  Take 1 tablet (20 mg total) by mouth daily. For swelling     gabapentin 400 MG capsule  Commonly known as:  NEURONTIN  Take 2 capsules (800 mg total) by mouth 3 (three) times daily. For agitation     hydrOXYzine 50 MG capsule  Commonly known as:  VISTARIL  Take 1 capsule (50 mg total) by mouth 3 (three) times daily as needed for anxiety.     insulin NPH Human 100 UNIT/ML injection  Commonly known as:  HUMULIN N  Inject 0.01-0.04 mLs (1-4 Units total) into the skin daily before breakfast. For diabetes     lisinopril 40 MG tablet  Commonly known as:  PRINIVIL,ZESTRIL  Take 1 tablet (40 mg total) by mouth 2 (two) times daily. For high blood pressure     ondansetron 8 MG tablet  Commonly known as:  ZOFRAN  Take 1 tablet (8 mg total) by mouth 4 (four) times daily -  before meals and at bedtime. Nausea     oxycodone 30 MG immediate release tablet  Commonly known as:  ROXICODONE  Take 30 mg by mouth every 4 (four) hours as needed for pain.     sitaGLIPtin-metformin 50-1000 MG tablet  Commonly known as:  JANUMET  Take 1 tablet by mouth 2 (two) times  daily with a meal. For diabetes     traZODone 50 MG tablet  Commonly known as:  DESYREL  Take 1 tablet (50 mg total) by mouth at bedtime. For sleep       Allergies  Allergen Reactions  . Atenolol Anaphylaxis  . Other Anaphylaxis    "Prednisone like drugs"  . Tylenol [Acetaminophen] Other (See Comments)    GI Bleed      The results of significant diagnostics from this hospitalization (including imaging, microbiology, ancillary and laboratory) are listed below for reference.    Significant Diagnostic Studies: Ct Head Code Stroke Wo Contrast  08/21/2015  CLINICAL DATA:  40 year old male with right-sided facial droop and left sided  weakness. EXAM: CT HEAD WITHOUT CONTRAST TECHNIQUE: Contiguous axial images were obtained from the base of the skull through the vertex without intravenous contrast. COMPARISON:  Head CT dated 04/11/2015 FINDINGS: The ventricles and the sulci are appropriate in size for the patient's age. There is no intracranial hemorrhage. No midline shift or mass effect identified. The gray-white matter differentiation is preserved. There is mild apparent invasion of the MCAs bilaterally, likely related to hemoconcentration. There is slight asymmetric decreased conspicuity of the M2 and M3 segments of the right MCA on the sagittal images. The more proximal right MCA thrombus is not excluded. CT angiography or MRI may provide better evaluation. The visualized paranasal sinuses and mastoid air cells are well aerated. The calvarium is intact. IMPRESSION: No acute intracranial hemorrhage. Slight asymmetric decreased visualization of the distal segments of the right MCA on the sagittal images. This may be artifactual and related to patient position in the scanner. A right MCA occlusion is not excluded. CT angiography or MRI is recommended if there is high clinical concern for right MCA territory ischemia. These results were called by telephone at the time of interpretation on 08/21/2015 at  12:58 am to Dr. Roseanne Reno who verbally acknowledged these results. Electronically Signed   By: Elgie Collard M.D.   On: 08/21/2015 00:59    Microbiology: No results found for this or any previous visit (from the past 240 hour(s)).   Labs: Basic Metabolic Panel:  Recent Labs Lab 09/14/15 0111  NA 136  K 4.0  CL 103  CO2 27  GLUCOSE 129*  BUN 10  CREATININE 0.73  CALCIUM 8.7*   Liver Function Tests:  Recent Labs Lab 09/14/15 0111  AST 44*  ALT 55  ALKPHOS 67  BILITOT 1.1  PROT 7.6  ALBUMIN 3.3*   No results for input(s): LIPASE, AMYLASE in the last 168 hours. No results for input(s): AMMONIA in the last 168 hours. CBC:  Recent Labs Lab 09/14/15 0111  WBC 11.2*  HGB 14.4  HCT 42.0  MCV 90.1  PLT 226   Cardiac Enzymes: No results for input(s): CKTOTAL, CKMB, CKMBINDEX, TROPONINI in the last 168 hours. BNP: BNP (last 3 results)  Recent Labs  04/01/15 0030  BNP 16.0    ProBNP (last 3 results) No results for input(s): PROBNP in the last 8760 hours.  CBG:  Recent Labs Lab 09/15/15 1124 09/15/15 1729 09/15/15 2123 09/16/15 0803 09/16/15 1204  GLUCAP 162* 112* 166* 182* 142*    Time spent: 32 minutes were spent in preparing this discharge including medication reconciliation, counseling, and coordination of care.  Signed:  Rachele Lamaster [employer]  [facility] 09/16/2015, 1:48 PM

## 2015-09-16 NOTE — Progress Notes (Signed)
All shift, pt continuously walked laps around the unit with the sitter. He followed commands and was cooperative. sheriff's department had been called for [facility]. The longer it took for transport the more anxious the pt became. He was still calm at this point. Around 1800 the sitter activated her duress button on the locator. Another RN ran to stairwell near 1428 and tried to chase him down. I ran from 1443 and follow the same stairwell out to the parking lot. Security was notified. Staff, security and [employer] searched campus and [place] behind hospital but could not find pt. MD also made aware. Julio SicksK. Toby Breithaupt RN

## 2015-09-16 NOTE — Progress Notes (Addendum)
Patient has been accepted to [facility] hospital. Nurse given report. Sheriff called for transport.  Patient informed about acceptance and transport.  Pt requested LCSWA call pt. Parole officer to inform them of patient disposition. Parole officer informed.  Educated patient about Archivistcalling parole officer once Discharged for [facility].  IVC papers placed in envelope.   Vivi BarrackNicole Jeshawn Melucci, Theresia MajorsLCSWA, MSW Clinical Social Worker 5E and Psychiatric Service Line [phone] 09/16/2015  1:52 PM

## 2015-09-27 ENCOUNTER — Emergency Department (HOSPITAL_COMMUNITY): Payer: Medicare Other

## 2015-09-27 ENCOUNTER — Inpatient Hospital Stay (HOSPITAL_COMMUNITY)
Admission: EM | Admit: 2015-09-27 | Discharge: 2015-10-01 | DRG: 915 | Disposition: A | Discharge status: Home / Self Care | Payer: Medicare Other | Attending: Internal Medicine | Admitting: Internal Medicine

## 2015-09-27 ENCOUNTER — Encounter (HOSPITAL_COMMUNITY): Payer: Self-pay

## 2015-09-27 DIAGNOSIS — E860 Dehydration: Secondary | ICD-10-CM | POA: Diagnosis present

## 2015-09-27 DIAGNOSIS — Z794 Long term (current) use of insulin: Secondary | ICD-10-CM

## 2015-09-27 DIAGNOSIS — I10 Essential (primary) hypertension: Secondary | ICD-10-CM | POA: Diagnosis present

## 2015-09-27 DIAGNOSIS — E119 Type 2 diabetes mellitus without complications: Secondary | ICD-10-CM | POA: Diagnosis present

## 2015-09-27 DIAGNOSIS — T50904A Poisoning by unspecified drugs, medicaments and biological substances, undetermined, initial encounter: Secondary | ICD-10-CM

## 2015-09-27 DIAGNOSIS — Z6841 Body Mass Index (BMI) 40.0 and over, adult: Secondary | ICD-10-CM

## 2015-09-27 DIAGNOSIS — T783XXA Angioneurotic edema, initial encounter: Principal | ICD-10-CM | POA: Diagnosis present

## 2015-09-27 DIAGNOSIS — T424X1A Poisoning by benzodiazepines, accidental (unintentional), initial encounter: Secondary | ICD-10-CM | POA: Diagnosis present

## 2015-09-27 DIAGNOSIS — Z4659 Encounter for fitting and adjustment of other gastrointestinal appliance and device: Secondary | ICD-10-CM

## 2015-09-27 DIAGNOSIS — J9600 Acute respiratory failure, unspecified whether with hypoxia or hypercapnia: Secondary | ICD-10-CM | POA: Diagnosis present

## 2015-09-27 DIAGNOSIS — Z886 Allergy status to analgesic agent status: Secondary | ICD-10-CM

## 2015-09-27 DIAGNOSIS — E785 Hyperlipidemia, unspecified: Secondary | ICD-10-CM | POA: Diagnosis present

## 2015-09-27 DIAGNOSIS — F319 Bipolar disorder, unspecified: Secondary | ICD-10-CM | POA: Diagnosis present

## 2015-09-27 DIAGNOSIS — K1379 Other lesions of oral mucosa: Secondary | ICD-10-CM | POA: Diagnosis present

## 2015-09-27 DIAGNOSIS — Z888 Allergy status to other drugs, medicaments and biological substances status: Secondary | ICD-10-CM

## 2015-09-27 DIAGNOSIS — G4733 Obstructive sleep apnea (adult) (pediatric): Secondary | ICD-10-CM | POA: Diagnosis present

## 2015-09-27 DIAGNOSIS — Z7982 Long term (current) use of aspirin: Secondary | ICD-10-CM

## 2015-09-27 DIAGNOSIS — E872 Acidosis: Secondary | ICD-10-CM | POA: Diagnosis present

## 2015-09-27 DIAGNOSIS — Z8673 Personal history of transient ischemic attack (TIA), and cerebral infarction without residual deficits: Secondary | ICD-10-CM

## 2015-09-27 DIAGNOSIS — T402X1A Poisoning by other opioids, accidental (unintentional), initial encounter: Secondary | ICD-10-CM | POA: Diagnosis present

## 2015-09-27 DIAGNOSIS — F17210 Nicotine dependence, cigarettes, uncomplicated: Secondary | ICD-10-CM | POA: Diagnosis present

## 2015-09-27 DIAGNOSIS — F909 Attention-deficit hyperactivity disorder, unspecified type: Secondary | ICD-10-CM | POA: Diagnosis present

## 2015-09-27 DIAGNOSIS — Z978 Presence of other specified devices: Secondary | ICD-10-CM

## 2015-09-27 DIAGNOSIS — Z79899 Other long term (current) drug therapy: Secondary | ICD-10-CM

## 2015-09-27 DIAGNOSIS — N179 Acute kidney failure, unspecified: Secondary | ICD-10-CM | POA: Diagnosis present

## 2015-09-27 DIAGNOSIS — T464X5A Adverse effect of angiotensin-converting-enzyme inhibitors, initial encounter: Secondary | ICD-10-CM | POA: Diagnosis present

## 2015-09-27 LAB — COMPREHENSIVE METABOLIC PANEL
ALK PHOS: 61 U/L (ref 38–126)
ALT: 70 U/L — AB (ref 17–63)
AST: 51 U/L — AB (ref 15–41)
Albumin: 3.8 g/dL (ref 3.5–5.0)
Anion gap: 11 (ref 5–15)
BUN: 9 mg/dL (ref 6–20)
CALCIUM: 8.9 mg/dL (ref 8.9–10.3)
CO2: 20 mmol/L — AB (ref 22–32)
CREATININE: 1.36 mg/dL — AB (ref 0.61–1.24)
Chloride: 100 mmol/L — ABNORMAL LOW (ref 101–111)
Glucose, Bld: 270 mg/dL — ABNORMAL HIGH (ref 65–99)
Potassium: 3.7 mmol/L (ref 3.5–5.1)
SODIUM: 131 mmol/L — AB (ref 135–145)
Total Bilirubin: 1 mg/dL (ref 0.3–1.2)
Total Protein: 7.9 g/dL (ref 6.5–8.1)

## 2015-09-27 LAB — ETHANOL

## 2015-09-27 LAB — CBC WITH DIFFERENTIAL/PLATELET
BASOS PCT: 0 %
Basophils Absolute: 0 10*3/uL (ref 0.0–0.1)
EOS ABS: 0.2 10*3/uL (ref 0.0–0.7)
EOS PCT: 1 %
HCT: 46.3 % (ref 39.0–52.0)
HEMOGLOBIN: 15.7 g/dL (ref 13.0–17.0)
LYMPHS ABS: 2.3 10*3/uL (ref 0.7–4.0)
Lymphocytes Relative: 16 %
MCH: 30.5 pg (ref 26.0–34.0)
MCHC: 33.9 g/dL (ref 30.0–36.0)
MCV: 90.1 fL (ref 78.0–100.0)
MONOS PCT: 7 %
Monocytes Absolute: 1 10*3/uL (ref 0.1–1.0)
NEUTROS PCT: 76 %
Neutro Abs: 11.5 10*3/uL — ABNORMAL HIGH (ref 1.7–7.7)
PLATELETS: 242 10*3/uL (ref 150–400)
RBC: 5.14 MIL/uL (ref 4.22–5.81)
RDW: 12.3 % (ref 11.5–15.5)
WBC: 15 10*3/uL — ABNORMAL HIGH (ref 4.0–10.5)

## 2015-09-27 LAB — ACETAMINOPHEN LEVEL: Acetaminophen (Tylenol), Serum: <10 ug/mL — ABNORMAL LOW (ref 10–30)

## 2015-09-27 LAB — SALICYLATE LEVEL
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
## 2015-09-27 NOTE — ED Triage Notes (Signed)
Pt comes from home ems after being found unresponsive and apneic. When EMS arrived to the home pt received 2mg  narcan IN and was bagged. IO established to Left [place] and additional 2mg  Narcan given IO. Pt then became more responsive. On arrival to ED Pt A&O x 4 in NAD. He admits to taking Oxycodone, klonopin, and xanax with no intentions to harm himself.

## 2015-09-27 NOTE — ED Notes (Signed)
Pt wife theresa called requesting to be called when pt is discharged and with updates. [phone]

## 2015-09-28 ENCOUNTER — Inpatient Hospital Stay (HOSPITAL_COMMUNITY): Payer: Medicare Other

## 2015-09-28 ENCOUNTER — Inpatient Hospital Stay (HOSPITAL_COMMUNITY): Payer: Medicare Other | Admitting: Certified Registered"

## 2015-09-28 DIAGNOSIS — E785 Hyperlipidemia, unspecified: Secondary | ICD-10-CM | POA: Diagnosis present

## 2015-09-28 DIAGNOSIS — N179 Acute kidney failure, unspecified: Secondary | ICD-10-CM | POA: Diagnosis present

## 2015-09-28 DIAGNOSIS — Z794 Long term (current) use of insulin: Secondary | ICD-10-CM | POA: Diagnosis not present

## 2015-09-28 DIAGNOSIS — Z79899 Other long term (current) drug therapy: Secondary | ICD-10-CM | POA: Diagnosis not present

## 2015-09-28 DIAGNOSIS — E872 Acidosis: Secondary | ICD-10-CM | POA: Diagnosis present

## 2015-09-28 DIAGNOSIS — F909 Attention-deficit hyperactivity disorder, unspecified type: Secondary | ICD-10-CM | POA: Diagnosis present

## 2015-09-28 DIAGNOSIS — Z886 Allergy status to analgesic agent status: Secondary | ICD-10-CM | POA: Diagnosis not present

## 2015-09-28 DIAGNOSIS — R45851 Suicidal ideations: Secondary | ICD-10-CM | POA: Diagnosis not present

## 2015-09-28 DIAGNOSIS — T464X5A Adverse effect of angiotensin-converting-enzyme inhibitors, initial encounter: Secondary | ICD-10-CM | POA: Diagnosis present

## 2015-09-28 DIAGNOSIS — E860 Dehydration: Secondary | ICD-10-CM | POA: Diagnosis present

## 2015-09-28 DIAGNOSIS — T50904A Poisoning by unspecified drugs, medicaments and biological substances, undetermined, initial encounter: Secondary | ICD-10-CM

## 2015-09-28 DIAGNOSIS — F17210 Nicotine dependence, cigarettes, uncomplicated: Secondary | ICD-10-CM | POA: Diagnosis present

## 2015-09-28 DIAGNOSIS — Z7982 Long term (current) use of aspirin: Secondary | ICD-10-CM | POA: Diagnosis not present

## 2015-09-28 DIAGNOSIS — Z8673 Personal history of transient ischemic attack (TIA), and cerebral infarction without residual deficits: Secondary | ICD-10-CM | POA: Diagnosis not present

## 2015-09-28 DIAGNOSIS — G4733 Obstructive sleep apnea (adult) (pediatric): Secondary | ICD-10-CM | POA: Diagnosis present

## 2015-09-28 DIAGNOSIS — F319 Bipolar disorder, unspecified: Secondary | ICD-10-CM | POA: Diagnosis present

## 2015-09-28 DIAGNOSIS — J9600 Acute respiratory failure, unspecified whether with hypoxia or hypercapnia: Secondary | ICD-10-CM | POA: Diagnosis present

## 2015-09-28 DIAGNOSIS — F329 Major depressive disorder, single episode, unspecified: Secondary | ICD-10-CM | POA: Diagnosis not present

## 2015-09-28 DIAGNOSIS — F3130 Bipolar disorder, current episode depressed, mild or moderate severity, unspecified: Secondary | ICD-10-CM | POA: Diagnosis not present

## 2015-09-28 DIAGNOSIS — T1491 Suicide attempt: Secondary | ICD-10-CM | POA: Diagnosis not present

## 2015-09-28 DIAGNOSIS — T402X1A Poisoning by other opioids, accidental (unintentional), initial encounter: Secondary | ICD-10-CM | POA: Diagnosis present

## 2015-09-28 DIAGNOSIS — Z888 Allergy status to other drugs, medicaments and biological substances status: Secondary | ICD-10-CM | POA: Diagnosis not present

## 2015-09-28 DIAGNOSIS — K1379 Other lesions of oral mucosa: Secondary | ICD-10-CM | POA: Diagnosis present

## 2015-09-28 DIAGNOSIS — E119 Type 2 diabetes mellitus without complications: Secondary | ICD-10-CM | POA: Diagnosis present

## 2015-09-28 DIAGNOSIS — T783XXA Angioneurotic edema, initial encounter: Secondary | ICD-10-CM | POA: Diagnosis present

## 2015-09-28 DIAGNOSIS — Z6841 Body Mass Index (BMI) 40.0 and over, adult: Secondary | ICD-10-CM | POA: Diagnosis not present

## 2015-09-28 DIAGNOSIS — T424X1A Poisoning by benzodiazepines, accidental (unintentional), initial encounter: Secondary | ICD-10-CM | POA: Diagnosis present

## 2015-09-28 DIAGNOSIS — I10 Essential (primary) hypertension: Secondary | ICD-10-CM | POA: Diagnosis present

## 2015-09-28 DIAGNOSIS — T40604S Poisoning by unspecified narcotics, undetermined, sequela: Secondary | ICD-10-CM | POA: Diagnosis not present

## 2015-09-28 LAB — URINE MICROSCOPIC-ADD ON

## 2015-09-28 LAB — I-STAT ARTERIAL BLOOD GAS, ED
ACID-BASE DEFICIT: 2 mmol/L (ref 0.0–2.0)
Bicarbonate: 24.2 mEq/L — ABNORMAL HIGH (ref 20.0–24.0)
O2 SAT: 96 %
PCO2 ART: 45.6 mmHg — AB (ref 35.0–45.0)
PH ART: 7.332 — AB (ref 7.350–7.450)
PO2 ART: 88 mmHg (ref 80.0–100.0)
Patient temperature: 98.6
TCO2: 26 mmol/L (ref 0–100)

## 2015-09-28 LAB — GLUCOSE, CAPILLARY
GLUCOSE-CAPILLARY: 122 mg/dL — AB (ref 65–99)
Glucose-Capillary: 117 mg/dL — ABNORMAL HIGH (ref 65–99)
Glucose-Capillary: 124 mg/dL — ABNORMAL HIGH (ref 65–99)
Glucose-Capillary: 121 mg/dL — ABNORMAL HIGH (ref 65–99)

## 2015-09-28 LAB — BASIC METABOLIC PANEL
ANION GAP: 8 (ref 5–15)
BUN: 11 mg/dL (ref 6–20)
CO2: 24 mmol/L (ref 22–32)
Calcium: 8.9 mg/dL (ref 8.9–10.3)
Chloride: 101 mmol/L (ref 101–111)
Creatinine, Ser: 0.87 mg/dL (ref 0.61–1.24)
GFR calc Af Amer: >60 mL/min (ref >60)
GLUCOSE: 129 mg/dL — AB (ref 65–99)
POTASSIUM: 3.9 mmol/L (ref 3.5–5.1)
Sodium: 133 mmol/L — ABNORMAL LOW (ref 135–145)

## 2015-09-28 LAB — URINALYSIS, ROUTINE W REFLEX MICROSCOPIC
GLUCOSE, UA: NEGATIVE mg/dL
HGB URINE DIPSTICK: NEGATIVE
KETONES UR: NEGATIVE mg/dL
Leukocytes, UA: NEGATIVE
Nitrite: NEGATIVE
PH: 5 (ref 5.0–8.0)
Protein, ur: 100 mg/dL — AB
SPECIFIC GRAVITY, URINE: 1.028 (ref 1.005–1.030)

## 2015-09-28 LAB — RAPID URINE DRUG SCREEN, HOSP PERFORMED
AMPHETAMINES: POSITIVE — AB
BENZODIAZEPINES: POSITIVE — AB
Barbiturates: NOT DETECTED
COCAINE: NOT DETECTED
OPIATES: POSITIVE — AB
TETRAHYDROCANNABINOL: NOT DETECTED

## 2015-09-28 LAB — MRSA PCR SCREENING: MRSA BY PCR: NEGATIVE

## 2015-09-28 MED ORDER — PROPOFOL 1000 MG/100ML IV EMUL
25.0000 ug/kg/min | INTRAVENOUS | Status: DC
  Administered 2015-09-28: 100 ug/kg/min via INTRAVENOUS
  Administered 2015-09-28: 80 ug/kg/min via INTRAVENOUS
  Administered 2015-09-28 (×2): 30 ug/kg/min via INTRAVENOUS
  Administered 2015-09-28: 70 ug/kg/min via INTRAVENOUS
  Administered 2015-09-28: 34.979 ug/kg/min via INTRAVENOUS
  Administered 2015-09-28 – 2015-09-29 (×3): 40 ug/kg/min via INTRAVENOUS
  Administered 2015-09-29: 45 ug/kg/min via INTRAVENOUS
  Administered 2015-09-29: 35 ug/kg/min via INTRAVENOUS
  Administered 2015-09-29: 30 ug/kg/min via INTRAVENOUS
  Administered 2015-09-29 (×2): 40 ug/kg/min via INTRAVENOUS
  Administered 2015-09-29: 50 ug/kg/min via INTRAVENOUS
  Administered 2015-09-30: 30 ug/kg/min via INTRAVENOUS
  Administered 2015-09-30: 25 ug/kg/min via INTRAVENOUS
  Filled 2015-09-28 (×10): qty 100
  Filled 2015-09-28: qty 200
  Filled 2015-09-28 (×5): qty 100

## 2015-09-28 MED ORDER — PROPOFOL 500 MG/50ML IV EMUL
INTRAVENOUS | Status: AC | PRN
  Administered 2015-09-28: 100 ug/kg/min via INTRAVENOUS

## 2015-09-28 MED ORDER — ROCURONIUM BROMIDE 100 MG/10ML IV SOLN
INTRAVENOUS | Status: DC | PRN
  Administered 2015-09-28: 50 mg via INTRAVENOUS

## 2015-09-28 MED ORDER — DIPHENHYDRAMINE HCL 50 MG/ML IJ SOLN
25.0000 mg | Freq: Once | INTRAMUSCULAR | Status: AC
Start: 2015-09-28 — End: 2015-09-28
  Administered 2015-09-28: 09:00:00 via INTRAVENOUS
  Filled 2015-09-28: qty 1

## 2015-09-28 MED ORDER — FENTANYL CITRATE (PF) 100 MCG/2ML IJ SOLN
INTRAMUSCULAR | Status: AC
  Filled 2015-09-28: qty 2

## 2015-09-28 MED ORDER — RACEPINEPHRINE HCL 2.25 % IN NEBU
INHALATION_SOLUTION | RESPIRATORY_TRACT | Status: AC
  Filled 2015-09-28: qty 0.5

## 2015-09-28 MED ORDER — INSULIN ASPART 100 UNIT/ML ~~LOC~~ SOLN
0.0000 [IU] | SUBCUTANEOUS | Status: DC
  Administered 2015-09-28 – 2015-09-29 (×4): 3 [IU] via SUBCUTANEOUS
  Administered 2015-09-29 (×2): 4 [IU] via SUBCUTANEOUS
  Administered 2015-09-29 (×2): 3 [IU] via SUBCUTANEOUS
  Administered 2015-09-30: 7 [IU] via SUBCUTANEOUS
  Administered 2015-09-30: 3 [IU] via SUBCUTANEOUS
  Administered 2015-09-30 – 2015-10-01 (×4): 4 [IU] via SUBCUTANEOUS
  Administered 2015-10-01: 7 [IU] via SUBCUTANEOUS
  Administered 2015-10-01: 3 [IU] via SUBCUTANEOUS
  Administered 2015-10-01: 4 [IU] via SUBCUTANEOUS

## 2015-09-28 MED ORDER — ARTIFICIAL TEARS OP OINT
1.0000 "application " | TOPICAL_OINTMENT | Freq: Three times a day (TID) | OPHTHALMIC | Status: DC
  Administered 2015-09-28 – 2015-09-29 (×5): 1 via OPHTHALMIC
  Filled 2015-09-28 (×3): qty 3.5

## 2015-09-28 MED ORDER — LIDOCAINE HCL (PF) 4 % IJ SOLN
5.0000 mL | Freq: Once | INTRAMUSCULAR | Status: DC
  Filled 2015-09-28: qty 5

## 2015-09-28 MED ORDER — SODIUM CHLORIDE 0.9 % IV SOLN
250.0000 mL | INTRAVENOUS | Status: DC | PRN
  Administered 2015-09-30: 250 mL via INTRAVENOUS

## 2015-09-28 MED ORDER — FENTANYL BOLUS VIA INFUSION
50.0000 ug | INTRAVENOUS | Status: DC | PRN
  Administered 2015-09-28 – 2015-09-29 (×3): 50 ug via INTRAVENOUS
  Filled 2015-09-28: qty 50

## 2015-09-28 MED ORDER — PROPOFOL 1000 MG/100ML IV EMUL
INTRAVENOUS | Status: AC
  Filled 2015-09-28: qty 100

## 2015-09-28 MED ORDER — FAMOTIDINE IN NACL 20-0.9 MG/50ML-% IV SOLN
20.0000 mg | Freq: Two times a day (BID) | INTRAVENOUS | Status: DC
  Administered 2015-09-28 – 2015-09-30 (×5): 20 mg via INTRAVENOUS
  Filled 2015-09-28 (×6): qty 50

## 2015-09-28 MED ORDER — RACEPINEPHRINE HCL 2.25 % IN NEBU
0.5000 mL | INHALATION_SOLUTION | Freq: Once | RESPIRATORY_TRACT | Status: AC
  Administered 2015-09-28: 0.5 mL via RESPIRATORY_TRACT

## 2015-09-28 MED ORDER — FENTANYL CITRATE (PF) 100 MCG/2ML IJ SOLN
100.0000 ug | Freq: Once | INTRAMUSCULAR | Status: DC | PRN
Start: 2015-09-28 — End: 2015-09-30

## 2015-09-28 MED ORDER — ONDANSETRON HCL 4 MG/2ML IJ SOLN: 4.0000 mg | Freq: Once | INTRAMUSCULAR | Status: DC

## 2015-09-28 MED ORDER — ROCURONIUM BROMIDE 50 MG/5ML IV SOLN
INTRAVENOUS | Status: AC | PRN
  Administered 2015-09-28: 50 mg via INTRAVENOUS

## 2015-09-28 MED ORDER — PROPOFOL 10 MG/ML IV BOLUS
INTRAVENOUS | Status: AC | PRN
  Administered 2015-09-28: 100 mg via INTRAVENOUS

## 2015-09-28 MED ORDER — HEPARIN SODIUM (PORCINE) 5000 UNIT/ML IJ SOLN
5000.0000 [IU] | Freq: Three times a day (TID) | INTRAMUSCULAR | Status: DC
  Administered 2015-09-28 – 2015-10-01 (×9): 5000 [IU] via SUBCUTANEOUS
  Filled 2015-09-28 (×11): qty 1

## 2015-09-28 MED ORDER — CHLORHEXIDINE GLUCONATE 0.12% ORAL RINSE (MEDLINE KIT)
15.0000 mL | Freq: Two times a day (BID) | OROMUCOSAL | Status: DC
  Administered 2015-09-28 – 2015-09-30 (×4): 15 mL via OROMUCOSAL

## 2015-09-28 MED ORDER — FENTANYL CITRATE (PF) 100 MCG/2ML IJ SOLN
INTRAMUSCULAR | Status: AC | PRN
  Administered 2015-09-28: 100 ug via INTRAVENOUS

## 2015-09-28 MED ORDER — PROPOFOL 10 MG/ML IV BOLUS
INTRAVENOUS | Status: DC | PRN
  Administered 2015-09-28 (×2): 50 mg via INTRAVENOUS
  Administered 2015-09-28: 100 mg via INTRAVENOUS

## 2015-09-28 MED ORDER — SODIUM CHLORIDE 0.9 % IV SOLN
100.0000 ug/h | INTRAVENOUS | Status: DC
  Administered 2015-09-28: 100 ug/h via INTRAVENOUS
  Administered 2015-09-29: 200 ug/h via INTRAVENOUS
  Administered 2015-09-29: 175 ug/h via INTRAVENOUS
  Filled 2015-09-28 (×3): qty 50

## 2015-09-28 MED ORDER — FENTANYL CITRATE (PF) 100 MCG/2ML IJ SOLN: 100.0000 ug | Freq: Once | INTRAMUSCULAR | Status: DC

## 2015-09-28 MED ORDER — MIDAZOLAM HCL 2 MG/2ML IJ SOLN
INTRAMUSCULAR | Status: AC
  Filled 2015-09-28: qty 4

## 2015-09-28 MED ORDER — FAMOTIDINE IN NACL 20-0.9 MG/50ML-% IV SOLN
20.0000 mg | Freq: Once | INTRAVENOUS | Status: AC
  Administered 2015-09-28: 20 mg via INTRAVENOUS
  Filled 2015-09-28: qty 50

## 2015-09-28 MED ORDER — ANTISEPTIC ORAL RINSE SOLUTION (CORINZ)
7.0000 mL | OROMUCOSAL | Status: DC
  Administered 2015-09-28 – 2015-09-30 (×19): 7 mL via OROMUCOSAL

## 2015-09-28 MED ORDER — CISATRACURIUM BOLUS VIA INFUSION
0.1000 mg/kg | Freq: Once | INTRAVENOUS | Status: AC
  Administered 2015-09-28: 17 mg via INTRAVENOUS
  Filled 2015-09-28: qty 17

## 2015-09-28 MED ORDER — SODIUM CHLORIDE 0.9 % IV SOLN
3.0000 ug/kg/min | INTRAVENOUS | Status: DC
  Administered 2015-09-28: 3 ug/kg/min via INTRAVENOUS
  Administered 2015-09-29: 1 ug/kg/min via INTRAVENOUS
  Filled 2015-09-28 (×2): qty 20

## 2015-09-28 MED ORDER — DIPHENHYDRAMINE HCL 50 MG/ML IJ SOLN
25.0000 mg | Freq: Four times a day (QID) | INTRAMUSCULAR | Status: DC
  Administered 2015-09-28 – 2015-09-30 (×9): 25 mg via INTRAVENOUS
  Filled 2015-09-28 (×3): qty 0.5
  Filled 2015-09-28: qty 1
  Filled 2015-09-28 (×4): qty 0.5
  Filled 2015-09-28: qty 1
  Filled 2015-09-28: qty 0.5

## 2015-09-28 NOTE — Procedures (Signed)
Central Venous Catheter Insertion Procedure Note WENCES DELPOZO [id number] 03-Oct-1975  Procedure: Insertion of Central Venous Catheter Indications: Assessment of intravascular volume, Drug and/or fluid administration and Frequent blood sampling  Procedure Details Consent: Unable to obtain consent because of emergent medical necessity. Time Out: Verified patient identification, verified procedure, site/side was marked, verified correct patient position, special equipment/implants available, medications/allergies/relevent history reviewed, required imaging and test results available.  Performed  Maximum sterile technique was used including antiseptics, cap, gloves, gown, hand hygiene, mask and sheet. Skin prep: Chlorhexidine; local anesthetic administered A antimicrobial bonded/coated triple lumen catheter was placed in the right internal jugular vein using the Seldinger technique.  Evaluation Blood flow good Complications: No apparent complications Patient did tolerate procedure well. Chest X-ray ordered to verify placement.  CXR: pending.  [name] 09/28/2015, 10:00 AM  [place]  [name]. Tyson Alias, MD, FACP Pgr: [phone] [facility]

## 2015-09-28 NOTE — Progress Notes (Addendum)
Patient intubated by Dr Tyson Alias with a bronchoscope due to angioedema with a 7.0 ETT taped at 24 cm at lip, good color change on ETCO2 detector, good BBS ausculted, X-Ray confirmed, placed on above vent settings per ICU physician. Will continue to monitor patient.

## 2015-09-28 NOTE — ED Notes (Signed)
Report given to 53m RN. Preparing to transport to 51M.

## 2015-09-28 NOTE — ED Notes (Signed)
Pt c/o throat pain, this RN looked at pt's throat and notified MD of new throat swelling.

## 2015-09-28 NOTE — H&P (Signed)
PULMONARY / CRITICAL CARE MEDICINE   Name: Bryan Gallegos MRN: [id number] DOB: 1975/09/13    ADMISSION DATE:  09/27/2015 CONSULTATION DATE:  09/28/15  REFERRING MD:  ED  CHIEF COMPLAINT:  Airway compromise  HISTORY OF PRESENT ILLNESS:   Bryan Gallegos is a 40 yo male with PMH as below who came to the ED on 7/29 via EMS after being found unresponsive and apneic. EMS delivered 2 mg of Narcan x 2 and was transferred to [facility] ED. In ED, patient was alert and oriented and admitted to taking Oxycodone, Klonopin, and Xanax (1 of each). Denied any intentions of harming himself. Behavioral health was consulted and psych evaluation was placed (patient was recently admitted for multi drug ingestion with suicidal intent). Patient was held in ED overnight until psych could see him. In the morning, patient endorsed throat pain and was noted to have abnormal phonation. On exam he was found to have an enlarged uvula. There were concerns of angioedema and airway compromise, PCCM was subsequently called.   PCCM intubated patient and placed a central line in the ED.   PAST MEDICAL HISTORY :  He  has a past medical history of Adult ADHD; Anaphylactic reaction; Anxiety; Asthma; Bipolar disorder (HCC); Complication of anesthesia; Diabetes mellitus; Difficult intubation; Heart valve disorder; Hyperlipidemia; Hypertension; Morbidly obese (HCC); OSA (obstructive sleep apnea); and Transient cerebral ischemia.  PAST SURGICAL HISTORY: He  has a past surgical history that includes Carpal tunnel release; Nose surgery; Wisdom tooth extraction; Tooth extraction; Esophagogastroduodenoscopy (N/A, 04/05/2015); and Esophagogastroduodenoscopy (egd) with propofol (N/A, 05/21/2015).  Allergies  Allergen Reactions  . Atenolol Anaphylaxis  . Other Anaphylaxis    "Prednisone like drugs"  . Tylenol [Acetaminophen] Other (See Comments)    GI Bleed    No current facility-administered medications on file prior to encounter.     Current Outpatient Prescriptions on File Prior to Encounter  Medication Sig  . albuterol (PROVENTIL HFA;VENTOLIN HFA) 108 (90 Base) MCG/ACT inhaler Inhale 2 puffs into the lungs every 6 (six) hours as needed for wheezing or shortness of breath.  . alprazolam (XANAX) 2 MG tablet Take 2 mg by mouth 3 (three) times daily as needed for anxiety. For anxiety  . amphetamine-dextroamphetamine (ADDERALL) 30 MG tablet Take 30 mg by mouth daily.  Marland Kitchen aspirin 325 MG tablet Take 1 tablet (325 mg total) by mouth daily. For heart health  . citalopram (CELEXA) 10 MG tablet Take 1 tablet (10 mg total) by mouth daily. For depression  . dexlansoprazole (DEXILANT) 60 MG capsule Take 1 capsule (60 mg total) by mouth daily. For acid reflux (Patient taking differently: Take 60 mg by mouth daily as needed. For acid reflux)  . furosemide (LASIX) 20 MG tablet Take 1 tablet (20 mg total) by mouth daily. For swelling  . gabapentin (NEURONTIN) 400 MG capsule Take 2 capsules (800 mg total) by mouth 3 (three) times daily. For agitation  . hydrOXYzine (VISTARIL) 50 MG capsule Take 1 capsule (50 mg total) by mouth 3 (three) times daily as needed for anxiety.  . insulin NPH Human (HUMULIN N) 100 UNIT/ML injection Inject 0.01-0.04 mLs (1-4 Units total) into the skin daily before breakfast. For diabetes  . lisinopril (PRINIVIL,ZESTRIL) 40 MG tablet Take 1 tablet (40 mg total) by mouth 2 (two) times daily. For high blood pressure  . ondansetron (ZOFRAN) 8 MG tablet Take 1 tablet (8 mg total) by mouth 4 (four) times daily -  before meals and at bedtime. Nausea  . oxycodone (ROXICODONE) 30  MG immediate release tablet Take 30 mg by mouth every 4 (four) hours as needed for pain.  . sitaGLIPtin-metformin (JANUMET) 50-1000 MG tablet Take 1 tablet by mouth 2 (two) times daily with a meal. For diabetes  . traZODone (DESYREL) 50 MG tablet Take 1 tablet (50 mg total) by mouth at bedtime. For sleep    FAMILY HISTORY:  His indicated that his  mother is alive. He indicated that the status of his father is unknown. He indicated that the status of his brother is unknown. He indicated that the status of his maternal grandmother is unknown. He indicated that the status of his maternal grandfather is unknown.    SOCIAL HISTORY: He  reports that he has been smoking Cigarettes.  He has a 11.50 pack-year smoking history. He has never used smokeless tobacco. He reports that he does not drink alcohol or use drugs.  REVIEW OF SYSTEMS:   + throat pain, change in speech Otherwise review of systems negative  SUBJECTIVE:  See HPI  VITAL SIGNS: BP 161/91   Pulse 114   Resp 20   Ht  (1.88 m)   Wt (!) 375 lb (170.1 kg)   SpO2 98%   BMI 48.15 kg/m   HEMODYNAMICS:    VENTILATOR SETTINGS: Vent Mode: PRVC FiO2 (%):  [40 %] 40 % Set Rate:  [24 bmp] 24 bmp Vt Set:  [600 mL] 600 mL PEEP:  [5 cmH20] 5 cmH20 Plateau Pressure:  [21 cmH20] 21 cmH20  INTAKE / OUTPUT: No intake/output data recorded.  PHYSICAL EXAMINATION: General:  Morbidly obese male, laying in bed, sedated and on ventilator Neuro:  Sedated, prior to sedation was moving all 4 extremities spontaneously HEENT:  Dry mucous membranes, muffled phonation prior to ventilation, vent now in palce Cardiovascular:  Regular rate and rhythm, no murmurs appreciated Lungs:  Clear to auscultation bilaterally Abdomen: obese abdomen, soft, normal bowel sounds Musculoskeletal:  Normal muscle bulk, no edema Skin:  No rashes  LABS:  BMET  Recent Labs Lab 09/27/15 2154  NA 131*  K 3.7  CL 100*  CO2 20*  BUN 9  CREATININE 1.36*  GLUCOSE 270*    Electrolytes  Recent Labs Lab 09/27/15 2154  CALCIUM 8.9    CBC  Recent Labs Lab 09/27/15 2154  WBC 15.0*  HGB 15.7  HCT 46.3  PLT 242    Coag's No results for input(s): APTT, INR in the last 168 hours.  Sepsis Markers No results for input(s): LATICACIDVEN, PROCALCITON, O2SATVEN in the last 168  hours.  ABG No results for input(s): PHART, PCO2ART, PO2ART in the last 168 hours.  Liver Enzymes  Recent Labs Lab 09/27/15 2154  AST 51*  ALT 70*  ALKPHOS 61  BILITOT 1.0  ALBUMIN 3.8    Cardiac Enzymes No results for input(s): TROPONINI, PROBNP in the last 168 hours.  Glucose No results for input(s): GLUCAP in the last 168 hours.  Imaging Dg Chest Port 1 View  Result Date: 09/27/2015 CLINICAL DATA:  Overdose. Pt states that he took Oxycodone, klonopin, and xanax. EXAM: PORTABLE CHEST 1 VIEW COMPARISON:  04/11/2015 FINDINGS: Cardiac silhouette is normal in size. No mediastinal or hilar masses or evidence of adenopathy. There are low lung volumes. Allowing for this, the lungs are clear. No pleural effusion or pneumothorax. Bony thorax is grossly intact. IMPRESSION: No active disease. Electronically Signed   By: Amie Portland M.D.   On: 09/27/2015 22:05    STUDIES:  7/30: U tox: (+) amphetamines, benzo,  and opitates 7/30: CXR improving atelectasis and vascular congestion  CULTURES: None  ANTIBIOTICS: None  SIGNIFICANT EVENTS: 7/29: ED presentation, psych consulted for suspected drug overdose and suicidal intent 7/30: Admit to CCM for airway management  LINES/TUBES: Urethral cath 7/30> ETT 7/30> PIV R forearm 7/30> Central line R neck 7/30>  DISCUSSION: Bryan Gallegos is a 40 yo male who presented with likely drug overdose with suicidal intent as below who came to the ED on 7/29 via EMS after being found unresponsive and apneic. Pt was alert and conscious in ED awaiting psych evaluation but then developed muffled phonation and found to have an enlarged uvula requiring intubation for airway protection.   ASSESSMENT / PLAN:  PULMONARY A: Acute Respiratory Failure due to upper airway compromise- uvula enlargement likely from allergic reaction. Unknown etiology of allergic rxn- ?lisinopril vs narcan vs food allergy Hx of OSA P:   Vent bundle No weaning  today Continuous pulse ox O2 goal > 92% AM ABG AM CXR Would like to give steroids but has steroid allergy according to Epic (anaphylaxis), will hold off for now IV Benadryl 25 mg q6  CARDIOVASCULAR A:  No acute issues Hx of HTN Hx of heart valve disorder Hx of HLD P:  Vital signs per floor protocol Hold antihypertensives right now, AVOID LISINOPRIL   RENAL A:   Mild AKI- possible prerenal cause from dehydration vs interstitial cause due to drugs Mild respiratory acidosis P:   KVO for now  BMP q 12  ABG in AM If Cr worsens, consider urine na and cr studies  GASTROINTESTINAL A:   Nutrition P:   NPO for now IV Pepcid q 12 while intubated  HEMATOLOGIC A:   No acute issues P:  Daily CBC  INFECTIOUS A:   No acute issues P:   Monitor for fever  ENDOCRINE A:   Hx of DM P:   Hold home meds (NPH and Janumet) CBG q4 with resistant SSI  NEUROLOGIC A:   Multidrug ingestion with suicidal intent Hx of Anxiety and ADHD Hx of TIA Sedation P:   Psych evaluation when able- will likely need inpatient psychiatric admission when medically stable Hold home psych meds (Xanax, adderral, celexa, klonipin, trazadone) Hold home oxycodone Propofol ggt Nimbex ggt Fentanyl PRN RASS goal: -1 to -2  FAMILY  - Updates: No family at bedside, will call wife  - Inter-disciplinary family meet or Palliative Care meeting due by: August 6th    Bryan Simmonds, MD [facility], PGY-2  09/28/2015, 9:14 AM  STAFF NOTE: I, Bryan Percy, MD FACP have personally reviewed patient's available data, including medical history, events of note, physical examination and test results as part of my evaluation. I have discussed with resident/NP and other care providers such as pharmacist, RN and RRT. In addition, I personally evaluated patient and elicited key findings of: awake talking with no stridor, does have hoarseness and large uvula, unclear if acei causing or  food ? Maybe allergy unknown, he has a difficult appearing airway and has a history of difficult per pt, Anesthesia was kind to come down also, under direct fiberoptics I did a awake intubation, he did well with this, I was able to pas the obstruction nd airway was NOT swollen, admit  To icu, sedate heavy and paralyze, re assess daily uvula, pepcid, benadryl start, he has a pred allergy which I am sceptical about , need to discuss with wife details prior to using IV steroids, 8 cc/kg, we had to  increase T V in ER for co2 up on monitor, repeat abg, get stat pcxr, may need alpha 1 antitrypsin levels,add dvt prev, may feed in am if not improved, get cbg, with benzo OD, will avoid for now, use prop, fent, get psych in future The patient is critically ill with multiple organ systems failure and requires high complexity decision making for assessment and support, frequent evaluation and titration of therapies, application of advanced monitoring technologies and extensive interpretation of multiple databases.   Critical Care Time devoted to patient care services described in this note is90 Minutes. This time reflects time of care of this signee: Bryan Percy, MD FACP. This critical care time does not reflect procedure time, or teaching time or supervisory time of PA/NP/Med student/Med Resident etc but could involve care discussion time. Rest per NP/medical resident whose note is outlined above and that I agree with   [name]. Tyson Alias, MD, FACP Pgr: [phone] [facility] 09/28/2015 2:01 PM

## 2015-09-28 NOTE — ED Provider Notes (Signed)
1:25 AM  D/w Guadalupe Dawn with [facility].  They recommend psych re-eval in AM.   Layla Maw Ward, DO 09/28/15 0126

## 2015-09-28 NOTE — BH Assessment (Signed)
Assessment completed. Consulted Bryan Morn, PA-C who recommended that pt be observed overnight and re-evaluated in the morning. Pt would benefit from treatment for opioid dependence.

## 2015-09-28 NOTE — Procedures (Signed)
Bronchoscopy Procedure Note for awake intubation Bryan Gallegos [id number] [birth year]  Procedure: Bronchoscopy Indications: to place ett  Procedure Details Consent: Risks of procedure as well as the alternatives and risks of each were explained to the (patient/caregiver).  Consent for procedure obtained. Time Out: Verified patient identification, verified procedure, site/side was marked, verified correct patient position, special equipment/implants available, medications/allergies/relevent history reviewed, required imaging and test results available.  Performed  In preparation for procedure, patient was given 100% FiO2 and bronchoscope lubricated. Sedation: topical hurricane only  Airway entered and the following bronchi were examined: Bronchi.   Procedures performed: intubation Bronchoscope removed.    Evaluation Hemodynamic Status: BP stable throughout; O2 sats: stable throughout Patient's Current Condition: stable Specimens:  None Complications: No apparent complications Patient did tolerate procedure well.   Bryan Gallegos 09/28/2015   Placed scope awake into phaynx, noted large uvula, moved pass, good view airway, passed through cords easily that were wnl Placed ett easily Bagged easily Secretions increased Mild desat Placed bronch throught ett, , ett 3 cm above carina Mucosa wnl  Bryan Gallegos. Bryan Alias, MD, FACP Pgr: [phone] [facility]

## 2015-09-28 NOTE — ED Notes (Signed)
TTS in progress in pt room at this time
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
## 2015-09-28 NOTE — ED Notes (Signed)
pharmacy called to receive fentanyl and Nimbrex.

## 2015-09-28 NOTE — Progress Notes (Signed)
Pt arrived on the unit. VS stable. Fentanyl at 100, Propofol at 80, Nimbex at 3 are infusing.    Bed in low position, alarms are one. Family at the bedside. Education was provided to family. Son states that "other family members should not come, like nieces and nephews. Because they can still IV medication from him." Education about camera in the room and visitation hours and expectations were given.
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
## 2015-09-28 NOTE — ED Provider Notes (Signed)
Patient care assumed at 0800.  He had benzo OD yesterday, and was pending psych eval this morning.  Called to bedside due to patient complaint of sore throat, and abnormal voice.  On physical exam, patient had very enlarged uvula with garbled phonation.  This could possibly be angioedema related to lisinopril use, but he hasn't had it in 2 days, and there were no other signs of anaphylaxis.  H1 and H2 blocker ordered; steroids deferred due to pt's reported history of anaphylaxis to steroids.  Anesthesia and critical care called to assist with securing definitive airway.  Patient was intubated by critical care.  Will be admitted to MICU.  Family updated.   Marcelina Morel, MD 09/28/15 [id number]    Leta Baptist, MD 09/29/15 2117

## 2015-09-28 NOTE — ED Notes (Signed)
Critical care and anesthesia at bedside to prepare to do awake intubation.

## 2015-09-28 NOTE — Procedures (Signed)
Intubation Procedure Note LOREN APRAHAMIAN [id number] 1975/06/06  Procedure: Intubation Indications: Airway protection and maintenance  Procedure Details Consent: Risks of procedure as well as the alternatives and risks of each were explained to the (patient/caregiver).  Consent for procedure obtained. Time Out: Verified patient identification, verified procedure, site/side was marked, verified correct patient position, special equipment/implants available, medications/allergies/relevent history reviewed, required imaging and test results available.  Performed  Maximum sterile technique was used including gown, hand hygiene, mask and sheet.  Bronch guided    Evaluation Hemodynamic Status: BP stable throughout; O2 sats: stable throughout Patient's Current Condition: stable Complications: No apparent complications Patient did tolerate procedure well. Chest X-ray ordered to verify placement.  CXR: pending.   [name] 09/28/2015  See bronch note

## 2015-09-28 NOTE — ED Notes (Signed)
Dr.feinstein at bedside to place central line.

## 2015-09-29 ENCOUNTER — Inpatient Hospital Stay (HOSPITAL_COMMUNITY): Payer: Medicare Other

## 2015-09-29 DIAGNOSIS — T1491 Suicide attempt: Secondary | ICD-10-CM

## 2015-09-29 DIAGNOSIS — T40604S Poisoning by unspecified narcotics, undetermined, sequela: Secondary | ICD-10-CM

## 2015-09-29 LAB — POCT I-STAT 3, ART BLOOD GAS (G3+)
Acid-base deficit: 1 mmol/L (ref 0.0–2.0)
Bicarbonate: 23.7 mEq/L (ref 20.0–24.0)
O2 Saturation: 97 %
PCO2 ART: 38.1 mmHg (ref 35.0–45.0)
PO2 ART: 87 mmHg (ref 80.0–100.0)
Patient temperature: 98.6
TCO2: 25 mmol/L (ref 0–100)
pH, Arterial: 7.402 (ref 7.350–7.450)

## 2015-09-29 LAB — BASIC METABOLIC PANEL
Anion gap: 6 (ref 5–15)
BUN: 11 mg/dL (ref 6–20)
CO2: 26 mmol/L (ref 22–32)
CREATININE: 0.88 mg/dL (ref 0.61–1.24)
Calcium: 8.7 mg/dL — ABNORMAL LOW (ref 8.9–10.3)
Chloride: 101 mmol/L (ref 101–111)
GFR calc Af Amer: >60 mL/min (ref >60)
Glucose, Bld: 145 mg/dL — ABNORMAL HIGH (ref 65–99)
Potassium: 3.8 mmol/L (ref 3.5–5.1)
SODIUM: 133 mmol/L — AB (ref 135–145)

## 2015-09-29 LAB — CBC
HEMATOCRIT: 42.7 % (ref 39.0–52.0)
Hemoglobin: 14.5 g/dL (ref 13.0–17.0)
MCH: 30.5 pg (ref 26.0–34.0)
MCHC: 34 g/dL (ref 30.0–36.0)
MCV: 89.9 fL (ref 78.0–100.0)
PLATELETS: 192 10*3/uL (ref 150–400)
RBC: 4.75 MIL/uL (ref 4.22–5.81)
RDW: 12.3 % (ref 11.5–15.5)
WBC: 10.4 10*3/uL (ref 4.0–10.5)

## 2015-09-29 LAB — GLUCOSE, CAPILLARY
GLUCOSE-CAPILLARY: 104 mg/dL — AB (ref 65–99)
GLUCOSE-CAPILLARY: 132 mg/dL — AB (ref 65–99)
Glucose-Capillary: 149 mg/dL — ABNORMAL HIGH (ref 65–99)
Glucose-Capillary: 168 mg/dL — ABNORMAL HIGH (ref 65–99)
Glucose-Capillary: 182 mg/dL — ABNORMAL HIGH (ref 65–99)

## 2015-09-29 LAB — PHOSPHORUS: Phosphorus: 3.8 mg/dL (ref 2.5–4.6)

## 2015-09-29 LAB — MAGNESIUM: Magnesium: 2.1 mg/dL (ref 1.7–2.4)

## 2015-09-29 MED ORDER — PRO-STAT SUGAR FREE PO LIQD
60.0000 mL | Freq: Four times a day (QID) | ORAL | Status: DC
  Administered 2015-09-29 (×3): 60 mL
  Filled 2015-09-29 (×6): qty 60

## 2015-09-29 MED ORDER — METHYLPREDNISOLONE SODIUM SUCC 125 MG IJ SOLR
60.0000 mg | Freq: Four times a day (QID) | INTRAMUSCULAR | Status: DC
  Administered 2015-09-29 – 2015-09-30 (×4): 60 mg via INTRAVENOUS
  Filled 2015-09-29 (×2): qty 0.96
  Filled 2015-09-29: qty 2
  Filled 2015-09-29: qty 0.96
  Filled 2015-09-29: qty 2
  Filled 2015-09-29: qty 0.96

## 2015-09-29 MED ORDER — VITAL HIGH PROTEIN PO LIQD
1000.0000 mL | ORAL | Status: DC
  Administered 2015-09-29: 1000 mL

## 2015-09-29 NOTE — Care Management Important Message (Signed)
Important Message  Patient Details  Name: Bryan Gallegos MRN: [id number] Date of Birth: [birth year]   Medicare Important Message Given:  Yes    [name], Stephan Minister 09/29/2015, 3:12 PM

## 2015-09-29 NOTE — Progress Notes (Signed)
Initial Nutrition Assessment  DOCUMENTATION CODES:   Morbid obesity  INTERVENTION:    Initiate TF via OGT with Vital High Protein at goal rate of 5 ml/h (120 ml per day) and Prostat 60 ml QID to provide 920 kcals, 129 gm protein, 100 ml free water daily.  Unable to meet protein needs due to high rate of propofol providing 1616 kcals per day.  NUTRITION DIAGNOSIS:   Inadequate oral intake related to inability to eat as evidenced by NPO status.  GOAL:   Provide needs based on ASPEN/SCCM guidelines  MONITOR:   Vent status, Labs, Weight trends, TF tolerance, I & O's  REASON FOR ASSESSMENT:   Consult Enteral/tube feeding initiation and management  ASSESSMENT:   40 yo male who presented with likely drug overdose with suicidal intent who came to the ED on 7/29 via EMS after being found unresponsive and apneic. Pt was alert and conscious in ED awaiting psych evaluation but then developed muffled phonation and found to have an enlarged uvula requiring intubation for airway protection.  Nutrition focused physical exam completed.  No muscle or subcutaneous fat depletion noticed. Received MD Consult for TF initiation and management. Labs reviewed sodium low. CBG's: [id number] Medications reviewed and include pepcid, novolog, solumedrol, propofol. Patient is currently intubated on ventilator support MV: 9.8 L/min Temp (24hrs), Avg:98.1 F (36.7 C), Min:97.6 F (36.4 C), Max:98.6 F (37 C)  Propofol: 61.2 ml/hr providing 1616 kcals per day from lipid Will be unable to meet 100% of nutrition needs until propofol rate is decreased.  Diet Order:  Diet NPO time specified  Skin:  Reviewed, no issues  Last BM:  7/29  Height:   Ht Readings from Last 1 Encounters:  09/28/15 6\' 2"  (1.88 m)    Weight:   Wt Readings from Last 1 Encounters:  09/29/15 (!) 383 lb 13.1 oz (174.1 kg)    Ideal Body Weight:  86.4 kg  BMI:  Body mass index is 49.28 kg/m.  Estimated  Nutritional Needs:   Kcal:  5462-7035  Protein:  216 gm  Fluid:  2.5 L  EDUCATION NEEDS:   No education needs identified at this time  Joaquin Courts, RD, LDN, CNSC Pager [phone] After Hours Pager [phone]

## 2015-09-29 NOTE — Care Management Note (Signed)
Case Management Note  Patient Details  Name: KHIEM HUNN MRN: [id number] Date of Birth: 1975-06-15  Subjective/Objective:    Admitted post being found unresponsive and apneic. Pt admitted to taking Oxycodone, Klonopin, and Xanax (1 of each). Denied any intentions of harming himself. Behavioral health was consulted and psych evaluation was placed (patient was recently admitted for multi drug ingestion with suicidal intent).  There were concerns of angioedema and airway compromise, PCCM was subsequently called - intubated                 Action/Plan:  PTA independent.  Hx of suicide attempts - current substance used.  CSW consulted in addition to pysche.  CM will continue to follow for discharge needs   Expected Discharge Date:                  Expected Discharge Plan:     In-House Referral:  Clinical Social Work  Discharge planning Services  CM Consult  Post Acute Care Choice:    Choice offered to:     DME Arranged:    DME Agency:     HH Arranged:    HH Agency:     Status of Service:  In process, will continue to follow  If discussed at Long Length of Stay Meetings, dates discussed:    Additional Comments:  Cherylann Parr, RN 09/29/2015, 11:32 AM

## 2015-09-29 NOTE — Progress Notes (Signed)
PULMONARY / CRITICAL CARE MEDICINE   Name: Bryan Gallegos MRN: [id number] DOB: 1976/02/22    ADMISSION DATE:  09/27/2015 CONSULTATION DATE:  09/28/15  REFERRING MD:  ED  CHIEF COMPLAINT:  Airway Compromise  HISTORY OF PRESENT ILLNESS:   Jesiel Garate is a 40 yo male with PMH as below who came to the ED on 7/29 via EMS after being found unresponsive and apneic. EMS delivered 2 mg of Narcan x 2 and was transferred to [facility] ED. In ED, patient was alert and oriented and admitted to taking Oxycodone, Klonopin, and Xanax (1 of each). Denied any intentions of harming himself. Behavioral health was consulted and psych evaluation was placed (patient was recently admitted for multi drug ingestion with suicidal intent). Patient was held in ED overnight until psych could see him. In the morning, patient endorsed throat pain and was noted to have abnormal phonation. On exam he was found to have an enlarged uvula. There were concerns of angioedema and airway compromise, PCCM was subsequently called.   PCCM intubated patient and placed a central line in the ED.   REVIEW OF SYSTEMS:   Unable to obtain as patient is intubated  SUBJECTIVE:  No acute events overnight Vitals were stable except for some hypertension when he was waking up more  VITAL SIGNS: BP (!) 142/83   Pulse 77   Temp 97.8 F (36.6 C) (Oral)   Resp (!) 24   Ht 6\' 2"  (1.88 m)   Wt (!) 383 lb 13.1 oz (174.1 kg)   SpO2 100%   BMI 49.28 kg/m   HEMODYNAMICS:    VENTILATOR SETTINGS: Vent Mode: PRVC FiO2 (%):  [40 %] 40 % Set Rate:  [24 bmp] 24 bmp Vt Set:  [600 mL] 600 mL PEEP:  [5 cmH20] 5 cmH20 Plateau Pressure:  [19 cmH20-21 cmH20] 19 cmH20  INTAKE / OUTPUT: I/O last 3 completed shifts: In: 721.4 [I.V.:671.4; IV Piggyback:50] Out: 875 [Urine:875]  PHYSICAL EXAMINATION: General:  Morbidly obese male, laying in bed, sedated and on ventilator Neuro: Sedated HEENT: vent in place Cardiovascular: Regular rate and  rhythm, no murmurs appreciated Lungs:  Clear to auscultation bilaterally Abdomen: obese abdomen, soft, normal bowel sounds Musculoskeletal:  Normal muscle bulk, no edema Skin:  No rashes  LABS:  BMET  Recent Labs Lab 09/27/15 2154 09/28/15 1800  NA 131* 133*  K 3.7 3.9  CL 100* 101  CO2 20* 24  BUN 9 11  CREATININE 1.36* 0.87  GLUCOSE 270* 129*    Electrolytes  Recent Labs Lab 09/27/15 2154 09/28/15 1800 09/29/15 0300  CALCIUM 8.9 8.9  --   MG  --   --  2.1  PHOS  --   --  3.8    CBC  Recent Labs Lab 09/27/15 2154 09/29/15 0300  WBC 15.0* 10.4  HGB 15.7 14.5  HCT 46.3 42.7  PLT 242 192    Coag's No results for input(s): APTT, INR in the last 168 hours.  Sepsis Markers No results for input(s): LATICACIDVEN, PROCALCITON, O2SATVEN in the last 168 hours.  ABG  Recent Labs Lab 09/28/15 1013 09/29/15 0238  PHART 7.332* 7.402  PCO2ART 45.6* 38.1  PO2ART 88.0 87.0    Liver Enzymes  Recent Labs Lab 09/27/15 2154  AST 51*  ALT 70*  ALKPHOS 61  BILITOT 1.0  ALBUMIN 3.8    Cardiac Enzymes No results for input(s): TROPONINI, PROBNP in the last 168 hours.  Glucose  Recent Labs Lab 09/28/15 1308 09/28/15 1539 09/28/15  2017 09/28/15 2328 09/29/15 0358  GLUCAP 117* 124* 122* 121* 104*    Imaging Dg Chest Portable 1 View  Result Date: 09/28/2015 CLINICAL DATA:  Central line placement. EXAM: PORTABLE CHEST 1 VIEW COMPARISON:  09/28/2015 FINDINGS: Right central line has been placed with the tip in the SVC. No pneumothorax. Endotracheal tube and NG tube are unchanged. Continued low lung volumes with bibasilar atelectasis and vascular congestion, slightly improved since prior study. IMPRESSION: Right central line tip in the SVC.  No pneumothorax. Improving bibasilar atelectasis and vascular congestion. Electronically Signed   By: Charlett Nose M.D.   On: 09/28/2015 10:16  Dg Chest Portable 1 View  Result Date: 09/28/2015 CLINICAL DATA:  Post  intubation. EXAM: PORTABLE CHEST 1 VIEW COMPARISON:  09/27/2015 FINDINGS: Endotracheal tube is 5 cm above the carina. NG tube enters the stomach. Low lung volumes with bibasilar atelectasis and vascular congestion. No visible effusions. IMPRESSION: Endotracheal tube 5 cm above the carina. Low volumes with vascular congestion and bibasilar atelectasis. Electronically Signed   By: Charlett Nose M.D.   On: 09/28/2015 09:46  Dg Abd Portable 1v  Result Date: 09/28/2015 CLINICAL DATA:  40 year old male evaluate for enteric tube placement. EXAM: PORTABLE ABDOMEN - 1 VIEW COMPARISON:  CT dated 05/16/2015 FINDINGS: An enteric tube is noted with tip in the left upper abdomen likely in the proximal stomach. No bowel dilatation identified the visualized abdomen. Evaluation however see limited due to patient's body habitus and soft tissue attenuation. The visualized lung bases are clear. IMPRESSION: Enteric tube likely in the proximal stomach. Electronically Signed   By: Elgie Collard M.D.   On: 09/28/2015 22:01    STUDIES:  7/30: U tox: (+) amphetamines, benzo, and opitates 7/30: CXR improving atelectasis and vascular congestion  CULTURES: None  ANTIBIOTICS: None  SIGNIFICANT EVENTS: 7/29: ED presentation, psych consulted for suspected drug overdose and suicidal intent 7/30: Admit to CCM for airway management  LINES/TUBES: Urethral cath 7/30> ETT 7/30> PIV R forearm 7/30> Central line R neck 7/30> NG tube 7/30>  DISCUSSION: Lanz Mor is a 40 yo male who presented with likely drug overdose with suicidal intent as below who came to the ED on 7/29 via EMS after being found unresponsive and apneic. Pt was alert and conscious in ED awaiting psych evaluation but then developed muffled phonation and found to have an enlarged uvula requiring intubation for airway protection.   ASSESSMENT / PLAN:  PULMONARY A: Acute Respiratory Failure due to upper airway compromise- uvula enlargement  likely from allergic reaction. Unknown etiology of allergic rxn- ?lisinopril vs narcan vs food allergy Hx of OSA P:   Vent bundle No weaning today Continuous pulse ox O2 goal > 92% Would like to give steroids but has steroid allergy according to Epic (anaphylaxis), will hold off for now IV Benadryl 25 mg q6 Clarify Prednisone allergy with family  CARDIOVASCULAR A:  No acute issues Hx of HTN Hx of heart valve disorder Hx of HLD P:  Vital signs per floor protocol Hold antihypertensives right now, AVOID LISINOPRIL   RENAL A:   Mild AKI- resolved Mild respiratory acidosis- resolved P:   KVO for now  Monitor electrolytes  GASTROINTESTINAL A:   Nutrition P:   NPO for now IV Pepcid q 12 while intubated Start tube feeds today  HEMATOLOGIC A:   No acute issues P:  Daily CBC  INFECTIOUS A:   No acute issues P:   Monitor for fever  ENDOCRINE A:   Hx of  DM P:   Hold home meds (NPH and Janumet) CBG q4 with resistant SSI  NEUROLOGIC A:   Multidrug ingestion with suicidal intent Hx of Anxiety and ADHD Hx of TIA Sedation P:   Psych evaluation when able- will likely need inpatient psychiatric admission when medically stable Hold home psych meds (Xanax, adderral, celexa, klonipin, trazadone) Hold home oxycodone Propofol ggt Nimbex ggt Fentanyl PRN RASS goal: -1 to -2  FAMILY  - Updates: No family at bedside, will call wife  - Inter-disciplinary family meet or Palliative Care meeting due by: August 6th  Anders Simmonds, MD [facility] Family Medicine, PGY-2  09/29/2015, 7:00 AM  Attending Note:  40 year old male with history of previous suicide who lost control of his airway and was intubate due to enlarged uvula.  Patient was emergently intubated.  On exam, lungs are clear on the vent.  Will proceed with cuff leak test to determine need for paralytics.  Reaction to steroids was a rash, will start solumedrol at 60 mg IV q6 hours.  If cuff  leak then will d/c nimbex and restraint patient, if no cuff leak will continue with nimbex for now.  Consult nutrition for TF.  Family updated at length bedside and all questions answered.  The patient is critically ill with multiple organ systems failure and requires high complexity decision making for assessment and support, frequent evaluation and titration of therapies, application of advanced monitoring technologies and extensive interpretation of multiple databases.   Critical Care Time devoted to patient care services described in this note is  35  Minutes. This time reflects time of care of this signee Dr Koren Bound. This critical care time does not reflect procedure time, or teaching time or supervisory time of PA/NP/Med student/Med Resident etc but could involve care discussion time.  Alyson Reedy, M.D. [facility] Pulmonary/Critical Care Medicine. Pager: [phone]. After hours pager: [phone].

## 2015-09-29 NOTE — Progress Notes (Signed)
OG tube coiled in mouth after bath and linen change. OG tube replaced and portable abdominal film ordered per protocol for placement.

## 2015-09-30 ENCOUNTER — Inpatient Hospital Stay (HOSPITAL_COMMUNITY): Payer: Medicare Other

## 2015-09-30 DIAGNOSIS — F3130 Bipolar disorder, current episode depressed, mild or moderate severity, unspecified: Secondary | ICD-10-CM

## 2015-09-30 DIAGNOSIS — F329 Major depressive disorder, single episode, unspecified: Secondary | ICD-10-CM

## 2015-09-30 DIAGNOSIS — R45851 Suicidal ideations: Secondary | ICD-10-CM

## 2015-09-30 LAB — BLOOD GAS, ARTERIAL
Acid-base deficit: 0.7 mmol/L (ref 0.0–2.0)
Bicarbonate: 23.1 mEq/L (ref 20.0–24.0)
Drawn by: 44135
FIO2: 0.4
LHR: 24 {breaths}/min
O2 SAT: 94.6 %
PATIENT TEMPERATURE: 98.6
PCO2 ART: 35.6 mmHg (ref 35.0–45.0)
PEEP/CPAP: 5 cmH2O
PH ART: 7.427 (ref 7.350–7.450)
PO2 ART: 70.5 mmHg — AB (ref 80.0–100.0)
TCO2: 24.2 mmol/L (ref 0–100)
VT: 600 mL

## 2015-09-30 LAB — CBC
HEMATOCRIT: 43.3 % (ref 39.0–52.0)
HEMOGLOBIN: 14.6 g/dL (ref 13.0–17.0)
MCH: 30.3 pg (ref 26.0–34.0)
MCHC: 33.7 g/dL (ref 30.0–36.0)
MCV: 89.8 fL (ref 78.0–100.0)
Platelets: 195 10*3/uL (ref 150–400)
RBC: 4.82 MIL/uL (ref 4.22–5.81)
RDW: 12.2 % (ref 11.5–15.5)
WBC: 10.6 10*3/uL — AB (ref 4.0–10.5)

## 2015-09-30 LAB — BASIC METABOLIC PANEL
ANION GAP: 7 (ref 5–15)
BUN: 17 mg/dL (ref 6–20)
CALCIUM: 9 mg/dL (ref 8.9–10.3)
CHLORIDE: 103 mmol/L (ref 101–111)
CO2: 24 mmol/L (ref 22–32)
Creatinine, Ser: 0.93 mg/dL (ref 0.61–1.24)
GFR calc Af Amer: >60 mL/min (ref >60)
GFR calc non Af Amer: >60 mL/min (ref >60)
GLUCOSE: 191 mg/dL — AB (ref 65–99)
Potassium: 4.9 mmol/L (ref 3.5–5.1)
Sodium: 134 mmol/L — ABNORMAL LOW (ref 135–145)

## 2015-09-30 LAB — GLUCOSE, CAPILLARY
GLUCOSE-CAPILLARY: 147 mg/dL — AB (ref 65–99)
GLUCOSE-CAPILLARY: 173 mg/dL — AB (ref 65–99)
GLUCOSE-CAPILLARY: 199 mg/dL — AB (ref 65–99)
GLUCOSE-CAPILLARY: 201 mg/dL — AB (ref 65–99)
Glucose-Capillary: 174 mg/dL — ABNORMAL HIGH (ref 65–99)
Glucose-Capillary: 163 mg/dL — ABNORMAL HIGH (ref 65–99)

## 2015-09-30 MED ORDER — CETYLPYRIDINIUM CHLORIDE 0.05 % MT LIQD
7.0000 mL | Freq: Two times a day (BID) | OROMUCOSAL | Status: DC
  Administered 2015-09-30 – 2015-10-01 (×2): 7 mL via OROMUCOSAL

## 2015-09-30 MED ORDER — CLONAZEPAM 0.5 MG PO TABS
1.0000 mg | ORAL_TABLET | Freq: Two times a day (BID) | ORAL | Status: DC
  Administered 2015-09-30 – 2015-10-01 (×3): 1 mg via ORAL
  Filled 2015-09-30 (×3): qty 2

## 2015-09-30 MED ORDER — LINAGLIPTIN 5 MG PO TABS
5.0000 mg | ORAL_TABLET | Freq: Every day | ORAL | Status: DC
  Administered 2015-09-30 – 2015-10-01 (×2): 5 mg via ORAL
  Filled 2015-09-30 (×2): qty 1

## 2015-09-30 MED ORDER — AMPHETAMINE-DEXTROAMPHETAMINE 10 MG PO TABS
10.0000 mg | ORAL_TABLET | Freq: Every day | ORAL | Status: DC
  Filled 2015-09-30: qty 1

## 2015-09-30 MED ORDER — RESOURCE THICKENUP CLEAR PO POWD
ORAL | Status: DC | PRN
  Filled 2015-09-30: qty 125

## 2015-09-30 MED ORDER — AMPHETAMINE-DEXTROAMPHETAMINE 10 MG PO TABS: 30.0000 mg | ORAL_TABLET | Freq: Every day | ORAL | Status: DC

## 2015-09-30 MED ORDER — OXYCODONE HCL 5 MG PO TABS
20.0000 mg | ORAL_TABLET | ORAL | Status: DC | PRN
  Administered 2015-09-30 – 2015-10-01 (×5): 20 mg via ORAL
  Filled 2015-09-30 (×5): qty 4

## 2015-09-30 MED ORDER — METFORMIN HCL 500 MG PO TABS
1000.0000 mg | ORAL_TABLET | Freq: Two times a day (BID) | ORAL | Status: DC
  Administered 2015-09-30 – 2015-10-01 (×2): 1000 mg via ORAL
  Filled 2015-09-30 (×3): qty 2

## 2015-09-30 MED ORDER — LISINOPRIL 40 MG PO TABS
40.0000 mg | ORAL_TABLET | Freq: Every day | ORAL | Status: DC
  Filled 2015-09-30: qty 1

## 2015-09-30 MED ORDER — METHYLPREDNISOLONE SODIUM SUCC 125 MG IJ SOLR
60.0000 mg | Freq: Two times a day (BID) | INTRAMUSCULAR | Status: DC
  Administered 2015-09-30 – 2015-10-01 (×2): 60 mg via INTRAVENOUS
  Filled 2015-09-30 (×2): qty 0.96

## 2015-09-30 MED ORDER — CITALOPRAM HYDROBROMIDE 10 MG PO TABS
10.0000 mg | ORAL_TABLET | Freq: Every day | ORAL | Status: DC
  Administered 2015-09-30 – 2015-10-01 (×2): 10 mg via ORAL
  Filled 2015-09-30 (×2): qty 1

## 2015-09-30 MED ORDER — TRAZODONE HCL 50 MG PO TABS
50.0000 mg | ORAL_TABLET | Freq: Every day | ORAL | Status: DC
  Administered 2015-09-30: 50 mg via ORAL
  Filled 2015-09-30: qty 1

## 2015-09-30 MED ORDER — ASPIRIN 325 MG PO TABS
325.0000 mg | ORAL_TABLET | Freq: Every day | ORAL | Status: DC
  Administered 2015-09-30 – 2015-10-01 (×2): 325 mg via ORAL
  Filled 2015-09-30 (×2): qty 1

## 2015-09-30 MED ORDER — GABAPENTIN 300 MG PO CAPS
300.0000 mg | ORAL_CAPSULE | Freq: Three times a day (TID) | ORAL | Status: DC
  Administered 2015-09-30 – 2015-10-01 (×3): 300 mg via ORAL
  Filled 2015-09-30 (×5): qty 1

## 2015-09-30 NOTE — Progress Notes (Signed)
RT note-Patient has a cuff leak again this AM, alert orientated.

## 2015-09-30 NOTE — Progress Notes (Signed)
Fentanyl 75 mls wasted and witnessed after extubation with Ash;ey RN

## 2015-09-30 NOTE — Evaluation (Signed)
Clinical/Bedside Swallow Evaluation Patient Details  Name: KEYONTE COOKSTON MRN: [id number] Date of Birth: [birth year]  Today's Date: 09/30/2015 Time: SLP Start Time (ACUTE ONLY): 1438 SLP Stop Time (ACUTE ONLY): 1454 SLP Time Calculation (min) (ACUTE ONLY): 16 min  Past Medical History:  Past Medical History:  Diagnosis Date  . Adult ADHD   . Anaphylactic reaction   . Anxiety   . Asthma   . Bipolar disorder (HCC)   . Complication of anesthesia    Per patient difficult intubation;  . Diabetes mellitus   . Difficult intubation    Per patient  . Heart valve disorder    s/p echocardiogram  . Hyperlipidemia   . Hypertension   . Morbidly obese (HCC)   . OSA (obstructive sleep apnea)   . Transient cerebral ischemia    Unknown   Past Surgical History:  Past Surgical History:  Procedure Laterality Date  . CARPAL TUNNEL RELEASE    . ESOPHAGOGASTRODUODENOSCOPY N/A 04/05/2015   Procedure: ESOPHAGOGASTRODUODENOSCOPY (EGD);  Surgeon: Malissa Hippo, MD;  Location: [facility];  Service: Endoscopy;  Laterality: N/A;  . ESOPHAGOGASTRODUODENOSCOPY (EGD) WITH PROPOFOL N/A 05/21/2015   Procedure: ESOPHAGOGASTRODUODENOSCOPY (EGD) WITH PROPOFOL;  Surgeon: Meryl Dare, MD;  Location: [facility] ENDOS[facility];  Service: Endoscopy;  Laterality: N/A;  . NOSE SURGERY    . TOOTH EXTRACTION    . WISDOM TOOTH EXTRACTION     HPI:  40 yo male who presented with likely drug overdose with suicidal intent as below who came to the ED on 7/29 via EMS after being found unresponsive and apneic. Pt was alert and conscious in ED awaiting psych evaluation but then developed muffled phonation and found to have an enlarged uvula requiring intubation for airway protection 7/30-8/1.    Assessment / Plan / Recommendation Clinical Impression  Pt has a hoarse voice s/p intubation with signs of an acute, reversible dysphagia. No overt signs of aspiration noted with small sips, yet pt could not complete the 3 ounce water test  without coughing. Concern for reduced sensation s/p extubation. He tolerated large, consecutive straw sips of nectar thick liquids (4oz) and solids without apparent difficulty. Recommend Dys 3 diet and nectar thick liquids for today, with meds whole in puree. Prognosis is good for likely quick diet advancement.    Aspiration Risk  Mild aspiration risk    Diet Recommendation Dysphagia 3 (Mech soft);Nectar-thick liquid   Liquid Administration via: Cup;Straw Medication Administration: Whole meds with puree Supervision: Patient able to self feed;Full supervision/cueing for compensatory strategies Compensations: Slow rate;Small sips/bites Postural Changes: Seated upright at 90 degrees    Other  Recommendations Oral Care Recommendations: Oral care BID Other Recommendations: Prohibited food (jello, ice cream, thin soups);Remove water pitcher   Follow up Recommendations   (tba)    Frequency and Duration min 2x/week  2 weeks       Prognosis Prognosis for Safe Diet Advancement: Good      Swallow Study   General HPI: 40 yo male who presented with likely drug overdose with suicidal intent as below who came to the ED on 7/29 via EMS after being found unresponsive and apneic. Pt was alert and conscious in ED awaiting psych evaluation but then developed muffled phonation and found to have an enlarged uvula requiring intubation for airway protection 7/30-8/1.  Type of Study: Bedside Swallow Evaluation Previous Swallow Assessment: none in chart Diet Prior to this Study: NPO Temperature Spikes Noted: No Respiratory Status: Nasal cannula History of Recent Intubation: Yes Length  of Intubations (days): 2 days Date extubated: 09/30/15 Behavior/Cognition: Alert;Cooperative;Pleasant mood Oral Cavity Assessment: Other (comment) (? swelling at uvula) Oral Care Completed by SLP: No Oral Cavity - Dentition: Adequate natural dentition Vision: Functional for self-feeding Self-Feeding Abilities: Able to  feed self Patient Positioning: Upright in bed Baseline Vocal Quality: Hoarse    Oral/Motor/Sensory Function Overall Oral Motor/Sensory Function: Within functional limits   Ice Chips Ice chips: Within functional limits Presentation: Self Fed;Spoon   Thin Liquid Thin Liquid: Impaired Presentation: Cup;Self Fed;Straw Pharyngeal  Phase Impairments: Suspected delayed Swallow;Cough - Immediate    Nectar Thick Nectar Thick Liquid: Within functional limits Presentation: Self Fed;Straw   Honey Thick Honey Thick Liquid: Not tested   Puree Puree: Within functional limits Presentation: Self Fed;Spoon   Solid   GO   Solid: Within functional limits Presentation: Self Georjean Mode 09/30/2015,3:06 PM  Maxcine Ham, M.A. CCC-SLP [phone]

## 2015-09-30 NOTE — Progress Notes (Signed)
Respiratory therapy extubation note-Tolerated procedure well, placed to 4l/min [place]. IS instructed. BBS equal, no stridor and able to vocalize.

## 2015-09-30 NOTE — Consult Note (Signed)
[facility] Consult   Reason for Consult:  Bipolar disorder Patient Identification: Bryan Gallegos MRN:  [id number] Principal Diagnosis: <principal problem not specified> Diagnosis:   Patient Active Problem List   Diagnosis Date Noted  . Uvular swelling [K13.79] 09/28/2015  . Acute respiratory failure ([place]) [J96.00] 09/28/2015  . OD (overdose of drug) [T50.901A] 09/14/2015  . Major depressive disorder, recurrent severe without psychotic features ([place]) [F33.2] 09/04/2015  . Polysubstance dependence including opioid type drug, episodic abuse ([place]) [F11.20, F19.20] 09/04/2015  . Left-sided weakness [M62.89] 08/21/2015  . Vomiting [R11.10] 08/21/2015  . Epigastric pain [R10.13]   . RUQ abdominal pain [R10.11]   . Intractable vomiting [R11.10] 05/17/2015  . Essential hypertension [I10] 05/17/2015  . Abdominal pain [R10.9] 05/17/2015  . Cholelithiasis [K80.20] 05/17/2015  . 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 Antisocial personality disorder [F60.2] 04/11/2015  . Opioid use disorder, severe, dependence ([place]) [F11.20] 04/11/2015  . Benzodiazepine dependence ([place]) [F13.20] 04/11/2015  . Opioid overdose [T40.2X1A] 04/11/2015  . Overdose [T50.901A] 04/11/2015  . Anoxic-ischemic encephalopathy ([place]) [G93.1, I67.82] 04/11/2015  . Altered mental status [R41.82]   . Hypoxic brain injury ([place]) [G93.1]   . HTN (hypertension) [I10] 04/10/2015  . Tobacco use disorder [F17.200] 04/10/2015  . Narcotic dependency, continuous ([place]) [F11.20] 03/14/2014  . Chronic back pain greater than 3 months duration [M54.9, G89.29] 01/28/2014  . Gastroesophageal reflux disease with esophagitis [K21.0] 01/28/2014  . Essential hypertension, benign [I10] 01/28/2014  . Asthma, mild persistent [J45.30] 01/28/2014  . Diabetes mellitus type II, controlled ([place]) [E11.9] 01/28/2014  . Morbid obesity ([place]) [E66.01] 01/28/2014  . TIA (transient ischemic attack) [G45.9] 09/02/2013  . HLD (hyperlipidemia)  [E78.5] 08/06/2013  . OSA (obstructive sleep apnea) [G47.33] 08/06/2013    Total Time spent with patient: 1 hour  Subjective:   Bryan Gallegos is a 40 y.o. male patient admitted with Acute respiratary failure and swelling of Uvula.  HPI:  Bryan Gallegos is a 40 yo male seen, chart reviewed For the face-to-face psychiatric consultation and evaluation of possible increased symptoms of depression and suicidal attempt. Information for this evaluation obtained from face-to-face with the patient and case discussed with his wife who is at bedside with patient consent. Patient reported he has been diagnosed with bipolar disorder and also has a multiple chronic medical problems and recently discharged from [facility] in [place]. Patient has scheduled follow-up appointment with a psychiatrist at [facility]. Patient lives with his wife and has 3 children. Patient denies current symptoms of depression, bipolar mania, anxiety, psychosis and suicidal/homicidal ideation, intention or plans. Patient wife who is at bedside agree with his report. Reportedly patient wife saw him falling down because of allergic reaction to a chemical sprayed in patient's uncles bed to kill the bedbugs. Reportedly patient has been prescribed Depakote and Celexa at the time of discharge from the hospital. Patient reports lots of spinal abnormalities and need of chronic pain medication management. Patient endorses taking regular medication of oxycodone, Klonopin and Xanax as prescribed but denied intentional drug overdose. Patient required intubation for possible angioedema and enlarged uvula and currently extubated. Patient urine drug screen is positive for opiates, benzodiazepines and amphetamines.  Please review the following information from more details. Patient was held in ED overnight until psych could see him. In the morning, patient endorsed throat pain and was noted to have abnormal phonation. On exam he was found to have  an enlarged uvula. There were concerns of angioedema and airway compromise, PCCM was subsequently called. PCCM intubated  patient and placed a central line in the ED.  Past Psychiatric History:  Patient has been diagnosed with bipolar disorder, attention deficit hyperactive disorder, posttraumatic stress disorder and has multiple acute psychiatric hosptalization.  Risk to Self: Suicidal Ideation: No-Not Currently/Within Last 6 Months Suicidal Intent: No-Not Currently/Within Last 6 Months Is patient at risk for suicide?: No Suicidal Plan?: No-Not Currently/Within Last 6 Months Specify Current Suicidal Plan: Pt denies  Access to Means: No Specify Access to Suicidal Means: No plan reported.  What has been your use of drugs/alcohol within the last 12 months?: Unable to assess at this time.  How many times?: 10 Other Self Harm Risks: Pt reported a history of burning.  Triggers for Past Attempts: Unpredictable Intentional Self Injurious Behavior: Burning Comment - Self Injurious Behavior: Pt reported a history of burning.  Risk to Others: Homicidal Ideation: No Thoughts of Harm to Others: No Current Homicidal Intent: No Current Homicidal Plan: No Access to Homicidal Means: No Identified Victim: N/A History of harm to others?: No Assessment of Violence: None Noted Violent Behavior Description: No violent behaviors observed. PT is calm and cooperative at this time.  Does patient have access to weapons?: No Criminal Charges Pending?: Yes Describe Pending Criminal Charges: DWLR NOT IMPAIRED REV Does patient have a court date: Yes Court Date: 10/07/15 Prior Inpatient Therapy: Prior Inpatient Therapy: Yes Prior Therapy Dates: 2/17, 7/17 Prior Therapy Facilty/Provider(s): [place], [facility] Reason for Treatment: Depression Prior Outpatient Therapy: Prior Outpatient Therapy: No  Past Medical History:  Past Medical History:  Diagnosis Date  . Adult ADHD   . Anaphylactic reaction   . Anxiety    . Asthma   . Bipolar disorder ([place])   . Complication of anesthesia    Per patient difficult intubation;  . Diabetes mellitus   . Difficult intubation    Per patient  . Heart valve disorder    s/p echocardiogram  . Hyperlipidemia   . Hypertension   . Morbidly obese ([place])   . OSA (obstructive sleep apnea)   . Transient cerebral ischemia    Unknown    Past Surgical History:  Procedure Laterality Date  . CARPAL TUNNEL RELEASE    . ESOPHAGOGASTRODUODENOSCOPY N/A 04/05/2015   Procedure: ESOPHAGOGASTRODUODENOSCOPY (EGD);  Surgeon: Rogene Houston, MD;  Location: [facility];  Service: Endoscopy;  Laterality: N/A;  . ESOPHAGOGASTRODUODENOSCOPY (EGD) WITH PROPOFOL N/A 05/21/2015   Procedure: ESOPHAGOGASTRODUODENOSCOPY (EGD) WITH PROPOFOL;  Surgeon: Ladene Artist, MD;  Location: [facility] ENDOSCOPY;  Service: Endoscopy;  Laterality: N/A;  . NOSE SURGERY    . TOOTH EXTRACTION    . WISDOM TOOTH EXTRACTION     Family History:  Family History  Problem Relation Age of Onset  . CAD Mother     Living  . Diabetes Mellitus II Mother   . Stroke Mother   . Hypertension Mother   . Congestive Heart Failure Mother   . Kidney disease Mother   . Fibromyalgia Mother   . Thyroid disease Mother   . Hyperlipidemia Mother   . Liver disease Mother   . Alcoholism Father 15    Deceased  . Arthritis Maternal Grandmother   . Congestive Heart Failure Maternal Grandmother   . Hypertension Maternal Grandmother   . Lung cancer Maternal Grandfather   . Colon cancer Maternal Aunt   . Stomach cancer Maternal Aunt   . Heart disease Other     Paternal & Maternal  . Crohn's disease Other   . Hypertension Other  Paternal & Maternal  . Hypertension Brother     x3  . Hypertension Sister     #1  . Bipolar disorder Sister     #1  . ADD / ADHD Son     x3  . Bipolar disorder Son     x3  . Asperger's syndrome Son    Family Psychiatric  History: Patient reported no family history of mental illness but has  grandmother suffering with the early stages of dementia. Patient stated his mother does not appreciate his achievements and expects more. Social History:  History  Alcohol Use No     History  Drug Use No    Social History   Social History  . Marital status: Single    Spouse name: N/A  . Number of children: N/A  . Years of education: N/A   Social History Main Topics  . Smoking status: Current Every Day Smoker    Packs/day: 0.50    Years: 23.00    Types: Cigarettes  . Smokeless tobacco: Never Used  . Alcohol use No  . Drug use: No  . Sexual activity: Not Currently    Partners: Female    Birth control/ protection: None   Other Topics Concern  . None   Social History Narrative  . None   Additional Social History:    Allergies:   Allergies  Allergen Reactions  . Atenolol Anaphylaxis  . Prednisone Hives  . Tylenol [Acetaminophen] Other (See Comments)    GI Bleed    Labs:  Results for orders placed or performed during the hospital encounter of 09/27/15 (from the past 48 hour(s))  Glucose, capillary     Status: Abnormal   Collection Time: 09/28/15  1:08 PM  Result Value Ref Range   Glucose-Capillary 117 (H) 65 - 99 mg/dL  Glucose, capillary     Status: Abnormal   Collection Time: 09/28/15  3:39 PM  Result Value Ref Range   Glucose-Capillary 124 (H) 65 - 99 mg/dL   Comment 1 Notify RN    Comment 2 Document in Chart   Basic metabolic panel (BMP)     Status: Abnormal   Collection Time: 09/28/15  6:00 PM  Result Value Ref Range   Sodium 133 (L) 135 - 145 mmol/L   Potassium 3.9 3.5 - 5.1 mmol/L   Chloride 101 101 - 111 mmol/L   CO2 24 22 - 32 mmol/L   Glucose, Bld 129 (H) 65 - 99 mg/dL   BUN 11 6 - 20 mg/dL   Creatinine, Ser 0.87 0.61 - 1.24 mg/dL   Calcium 8.9 8.9 - 10.3 mg/dL   GFR calc non Af Amer >60 >60 mL/min   GFR calc Af Amer >60 >60 mL/min    Comment: (NOTE) The eGFR has been calculated using the CKD EPI equation. This calculation has not been  validated in all clinical situations. eGFR's persistently <60 mL/min signify possible Chronic Kidney Disease.    Anion gap 8 5 - 15  Glucose, capillary     Status: Abnormal   Collection Time: 09/28/15  8:17 PM  Result Value Ref Range   Glucose-Capillary 122 (H) 65 - 99 mg/dL   Comment 1 Document in Chart   Glucose, capillary     Status: Abnormal   Collection Time: 09/28/15 11:28 PM  Result Value Ref Range   Glucose-Capillary 121 (H) 65 - 99 mg/dL   Comment 1 Notify RN    Comment 2 Document in Chart   I-STAT 3, arterial  blood gas (G3+)     Status: None   Collection Time: 09/29/15  2:38 AM  Result Value Ref Range   pH, Arterial 7.402 7.350 - 7.450   pCO2 arterial 38.1 35.0 - 45.0 mmHg   pO2, Arterial 87.0 80.0 - 100.0 mmHg   Bicarbonate 23.7 20.0 - 24.0 mEq/L   TCO2 25 0 - 100 mmol/L   O2 Saturation 97.0 %   Acid-base deficit 1.0 0.0 - 2.0 mmol/L   Patient temperature 98.6 F    Collection site RADIAL, ALLEN'S TEST ACCEPTABLE    Drawn by RT    Sample type ARTERIAL   CBC     Status: None   Collection Time: 09/29/15  3:00 AM  Result Value Ref Range   WBC 10.4 4.0 - 10.5 K/uL   RBC 4.75 4.22 - 5.81 MIL/uL   Hemoglobin 14.5 13.0 - 17.0 g/dL   HCT 42.7 39.0 - 52.0 %   MCV 89.9 78.0 - 100.0 fL   MCH 30.5 26.0 - 34.0 pg   MCHC 34.0 30.0 - 36.0 g/dL   RDW 12.3 11.5 - 15.5 %   Platelets 192 150 - 400 K/uL  Magnesium     Status: None   Collection Time: 09/29/15  3:00 AM  Result Value Ref Range   Magnesium 2.1 1.7 - 2.4 mg/dL  Phosphorus     Status: None   Collection Time: 09/29/15  3:00 AM  Result Value Ref Range   Phosphorus 3.8 2.5 - 4.6 mg/dL  Glucose, capillary     Status: Abnormal   Collection Time: 09/29/15  3:58 AM  Result Value Ref Range   Glucose-Capillary 104 (H) 65 - 99 mg/dL   Comment 1 Notify RN    Comment 2 Document in Chart   Glucose, capillary     Status: Abnormal   Collection Time: 09/29/15  7:27 AM  Result Value Ref Range   Glucose-Capillary 132 (H) 65  - 99 mg/dL   Comment 1 Notify RN    Comment 2 Document in Chart   Basic metabolic panel (BMP)     Status: Abnormal   Collection Time: 09/29/15  7:36 AM  Result Value Ref Range   Sodium 133 (L) 135 - 145 mmol/L   Potassium 3.8 3.5 - 5.1 mmol/L   Chloride 101 101 - 111 mmol/L   CO2 26 22 - 32 mmol/L   Glucose, Bld 145 (H) 65 - 99 mg/dL   BUN 11 6 - 20 mg/dL   Creatinine, Ser 0.88 0.61 - 1.24 mg/dL   Calcium 8.7 (L) 8.9 - 10.3 mg/dL   GFR calc non Af Amer >60 >60 mL/min   GFR calc Af Amer >60 >60 mL/min    Comment: (NOTE) The eGFR has been calculated using the CKD EPI equation. This calculation has not been validated in all clinical situations. eGFR's persistently <60 mL/min signify possible Chronic Kidney Disease.    Anion gap 6 5 - 15  Glucose, capillary     Status: Abnormal   Collection Time: 09/29/15 11:08 AM  Result Value Ref Range   Glucose-Capillary 149 (H) 65 - 99 mg/dL   Comment 1 Notify RN    Comment 2 Document in Chart   Glucose, capillary     Status: Abnormal   Collection Time: 09/29/15  3:18 PM  Result Value Ref Range   Glucose-Capillary 168 (H) 65 - 99 mg/dL   Comment 1 Notify RN    Comment 2 Document in Chart   Glucose, capillary  Status: Abnormal   Collection Time: 09/29/15  7:52 PM  Result Value Ref Range   Glucose-Capillary 182 (H) 65 - 99 mg/dL   Comment 1 Notify RN   Glucose, capillary     Status: Abnormal   Collection Time: 09/29/15 11:36 PM  Result Value Ref Range   Glucose-Capillary 199 (H) 65 - 99 mg/dL   Comment 1 Notify RN   CBC     Status: Abnormal   Collection Time: 09/30/15  2:27 AM  Result Value Ref Range   WBC 10.6 (H) 4.0 - 10.5 K/uL   RBC 4.82 4.22 - 5.81 MIL/uL   Hemoglobin 14.6 13.0 - 17.0 g/dL   HCT 43.3 39.0 - 52.0 %   MCV 89.8 78.0 - 100.0 fL   MCH 30.3 26.0 - 34.0 pg   MCHC 33.7 30.0 - 36.0 g/dL   RDW 12.2 11.5 - 15.5 %   Platelets 195 150 - 400 K/uL  Basic metabolic panel (BMP)     Status: Abnormal   Collection Time:  09/30/15  2:27 AM  Result Value Ref Range   Sodium 134 (L) 135 - 145 mmol/L   Potassium 4.9 3.5 - 5.1 mmol/L    Comment: DELTA CHECK NOTED NO VISIBLE HEMOLYSIS    Chloride 103 101 - 111 mmol/L   CO2 24 22 - 32 mmol/L   Glucose, Bld 191 (H) 65 - 99 mg/dL   BUN 17 6 - 20 mg/dL   Creatinine, Ser 0.93 0.61 - 1.24 mg/dL   Calcium 9.0 8.9 - 10.3 mg/dL   GFR calc non Af Amer >60 >60 mL/min   GFR calc Af Amer >60 >60 mL/min    Comment: (NOTE) The eGFR has been calculated using the CKD EPI equation. This calculation has not been validated in all clinical situations. eGFR's persistently <60 mL/min signify possible Chronic Kidney Disease.    Anion gap 7 5 - 15  Glucose, capillary     Status: Abnormal   Collection Time: 09/30/15  4:25 AM  Result Value Ref Range   Glucose-Capillary 174 (H) 65 - 99 mg/dL   Comment 1 Notify RN   Blood gas, arterial     Status: Abnormal   Collection Time: 09/30/15  4:45 AM  Result Value Ref Range   FIO2 0.40    Delivery systems VENTILATOR    Mode PRESSURE REGULATED VOLUME CONTROL    VT 600 mL   LHR 24 resp/min   Peep/cpap 5.0 cm H20   pH, Arterial 7.427 7.350 - 7.450   pCO2 arterial 35.6 35.0 - 45.0 mmHg   pO2, Arterial 70.5 (L) 80.0 - 100.0 mmHg   Bicarbonate 23.1 20.0 - 24.0 mEq/L   TCO2 24.2 0 - 100 mmol/L   Acid-base deficit 0.7 0.0 - 2.0 mmol/L   O2 Saturation 94.6 %   Patient temperature 98.6    Collection site RIGHT RADIAL    Drawn by [phone]    Sample type ARTERIAL    Allens test (pass/fail) PASS PASS  Glucose, capillary     Status: Abnormal   Collection Time: 09/30/15  7:45 AM  Result Value Ref Range   Glucose-Capillary 201 (H) 65 - 99 mg/dL   Comment 1 Notify RN    Comment 2 Document in Chart     Current Facility-Administered Medications  Medication Dose Route Frequency Provider Last Rate Last Dose  . 0.9 %  sodium chloride infusion  250 mL Intravenous PRN Carlyle Dolly, MD 10 mL/hr at 09/30/15 0800 250 mL  at 09/30/15 0800  .  [START ON 10/01/2015] amphetamine-dextroamphetamine (ADDERALL) tablet 10 mg  10 mg Oral Q breakfast Raylene Miyamoto, MD       And  . Derrill Memo ON 10/01/2015] amphetamine-dextroamphetamine (ADDERALL) tablet 10 mg  10 mg Oral Q breakfast Raylene Miyamoto, MD       And  . Derrill Memo ON 10/01/2015] amphetamine-dextroamphetamine (ADDERALL) tablet 10 mg  10 mg Oral Q breakfast Raylene Miyamoto, MD      . antiseptic oral rinse solution (CORINZ)  7 mL Mouth Rinse 10 times per day Raylene Miyamoto, MD   7 mL at 09/30/15 1010  . aspirin tablet 325 mg  325 mg Oral Daily Rush Farmer, MD      . chlorhexidine gluconate (SAGE KIT) (PERIDEX) 0.12 % solution 15 mL  15 mL Mouth Rinse BID Raylene Miyamoto, MD   15 mL at 09/30/15 0815  . citalopram (CELEXA) tablet 10 mg  10 mg Oral Daily Rush Farmer, MD      . clonazePAM (KLONOPIN) tablet 1 mg  1 mg Oral BID Rush Farmer, MD      . gabapentin (NEURONTIN) capsule 300 mg  300 mg Oral TID Rush Farmer, MD      . heparin injection 5,000 Units  5,000 Units Subcutaneous Q8H Carlyle Dolly, MD   5,000 Units at 09/30/15 0541  . insulin aspart (novoLOG) injection 0-20 Units  0-20 Units Subcutaneous Q4H Carlyle Dolly, MD   7 Units at 09/30/15 0813  . lidocaine (XYLOCAINE) 4 % (PF) injection 5 mL  5 mL Other Once Harvel Quale, MD      . linagliptin (TRADJENTA) tablet 5 mg  5 mg Oral Daily Rush Farmer, MD      . lisinopril (PRINIVIL,ZESTRIL) tablet 40 mg  40 mg Oral Daily Rush Farmer, MD      . metFORMIN (GLUCOPHAGE) tablet 1,000 mg  1,000 mg Oral BID WC Rush Farmer, MD      . methylPREDNISolone sodium succinate (SOLU-MEDROL) 125 mg/2 mL injection 60 mg  60 mg Intravenous Q12H Rush Farmer, MD      . ondansetron (ZOFRAN) injection 4 mg  4 mg Intravenous Once Harvel Quale, MD      . traZODone (DESYREL) tablet 50 mg  50 mg Oral QHS Rush Farmer, MD        Musculoskeletal: Strength & Muscle Tone: decreased Gait & Station: unable to  stand Patient leans: N/A  Psychiatric Specialty Exam: Physical Exam as per the history and physical   ROS patient reportedly has dizziness, falling from the toilet and questioned possibility of his medication versus chemical sprayed and uncle's home for bedbugs. Patient has been possibly responding his current medication management No Fever-chills, No Headache, No changes with Vision or hearing, reports vertigo No problems swallowing food or Liquids, No Chest pain, Cough or Shortness of Breath, No Abdominal pain, No Nausea or Vommitting, Bowel movements are regular, No Blood in stool or Urine, No dysuria, No new skin rashes or bruises, No new joints pains-aches,  No new weakness, tingling, numbness in any extremity, No recent weight gain or loss, No polyuria, polydypsia or polyphagia,   A full 10 point Review of Systems was done, except as stated above, all other Review of Systems were negative.  Blood pressure 134/66, pulse 76, temperature 98.1 F (36.7 C), temperature source Oral, resp. rate 18, height '6\' 2"'$  (1.88 m), weight (!) 173.7 kg (  382 lb 15 oz), SpO2 98 %.Body mass index is 49.17 kg/m.  General Appearance: Casual  Eye Contact:  Good  Speech:  Clear and Coherent  Volume:  Normal  Mood:  Euthymic  Affect:  Appropriate and Congruent  Thought Process:  Coherent and Goal Directed  Orientation:  Full (Time, Place, and Person)  Thought Content:  Logical  Suicidal Thoughts:  No  Homicidal Thoughts:  No  Memory:  Immediate;   Good Recent;   Good Remote;   Fair  Judgement:  Intact  Insight:  Good  Psychomotor Activity:  Decreased  Concentration:  Concentration: Fair and Attention Span: Fair  Recall:  Good  Fund of Knowledge:  Good  Language:  Good  Akathisia:  Negative  Handed:  Right  AIMS (if indicated):     Assets:  Communication Skills Desire for Improvement Financial Resources/Insurance Housing Intimacy Leisure Time Resilience Social  Support Transportation  ADL's:  Intact  Cognition:  WNL  Sleep:        Treatment Plan Summary: Patient admitted to [facility] with fall and questionable intentional drug overdose versus allergic reaction to either medication or exposure to chemicals / insecticide. Patient appropriately responded to his current medication and seems to be feeling much better without acute respiratory distress or swelling of uvula   Plan:  Safety risk: Patient has no active suicidal/homicidal ideation, intention or plans. Discontinue safety sitter   Bipolar disorder: Continue Depakote for mood swings as per [facility] discharge instructions   Posttraumatic stress disorder: Continue Celexa for anxiety  Patient will follow up with outpatient medication management medically cleared   ADHD: Continue Adderall as prescribed by primary care physician  Insomnia: Trazodone 50 mg at bedtime as needed   Appreciate psychiatric consultation and we sign off as of today Please contact [phone] or [phone] if needs further assistance  Disposition: Patient does not meet criteria for psychiatric inpatient admission. Supportive therapy provided about ongoing stressors.  Ambrose Finland, MD 09/30/2015 11:47 AM

## 2015-09-30 NOTE — Progress Notes (Signed)
eLink Physician-Brief Progress Note Patient Name: Bryan Gallegos DOB: Apr 11, 1975 MRN: [id number]   Date of Service  09/30/2015  HPI/Events of Note  Patient extubated this AM, all home meds re-started except painmeds  eICU Interventions  Complaining of back pain-roxicododne 20 mg every 6 hrs as needed ordered        Tyrelle Raczka 09/30/2015, 4:56 PM

## 2015-09-30 NOTE — Progress Notes (Signed)
PULMONARY / CRITICAL CARE MEDICINE   Name: TYGER WICHMAN MRN: [id number] DOB: Nov 25, 1975    ADMISSION DATE:  09/27/2015 CONSULTATION DATE:  09/28/15  REFERRING MD:  ED  CHIEF COMPLAINT:  Airway Compromise  HISTORY OF PRESENT ILLNESS:   Jakarius Flamenco is a 40 yo male with PMH as below who came to the ED on 7/29 via EMS after being found unresponsive and apneic. EMS delivered 2 mg of Narcan x 2 and was transferred to [facility] ED. In ED, patient was alert and oriented and admitted to taking Oxycodone, Klonopin, and Xanax (1 of each). Denied any intentions of harming himself. Behavioral health was consulted and psych evaluation was placed (patient was recently admitted for multi drug ingestion with suicidal intent). Patient was held in ED overnight until psych could see him. In the morning, patient endorsed throat pain and was noted to have abnormal phonation. On exam he was found to have an enlarged uvula. There were concerns of angioedema and airway compromise, PCCM was subsequently called.   PCCM intubated patient and placed a central line in the ED.   SUBJECTIVE:  No events overnight, attempting to ask questions.  VITAL SIGNS: BP 129/69   Pulse 79   Temp 98.7 F (37.1 C) (Oral)   Resp 18   Ht  (1.88 m)   Wt (!) 173.7 kg (382 lb 15 oz)   SpO2 96%   BMI 49.17 kg/m   HEMODYNAMICS:    VENTILATOR SETTINGS: Vent Mode: PSV;CPAP FiO2 (%):  [40 %] 40 % Set Rate:  [24 bmp] 24 bmp Vt Set:  [600 mL] 600 mL PEEP:  [5 cmH20] 5 cmH20 Pressure Support:  [5 cmH20-10 cmH20] 5 cmH20 Plateau Pressure:  [16 cmH20-17 cmH20] 17 cmH20  INTAKE / OUTPUT: I/O last 3 completed shifts: In: 2835.7 [I.V.:2400.7; NG/GT:285; IV Piggyback:150] Out: 2275 [Urine:2275]  PHYSICAL EXAMINATION: General:  Morbidly obese male, laying in bed, alert and interactive, moving all ext to command. Neuro: Awake and interactive. HEENT: [place]/AT, PERRL, EOM-I and MMM. Cardiovascular: RRR, Nl S1/S2,  -M/R/G. Lungs: Clear to auscultation bilaterally, air leak in place. Abdomen: Obese abdomen, soft, normal bowel sounds. Musculoskeletal: Normal muscle bulk, no edema Skin: No rashes  LABS:  BMET  Recent Labs Lab 09/28/15 1800 09/29/15 0736 09/30/15 0227  NA 133* 133* 134*  K 3.9 3.8 4.9  CL 101 101 103  CO2 BUN CREATININE 0.87 0.88 0.93  GLUCOSE 129* 145* 191*   Electrolytes  Recent Labs Lab 09/28/15 1800 09/29/15 0300 09/29/15 0736 09/30/15 0227  CALCIUM 8.9  --  8.7* 9.0  MG  --  2.1  --   --   PHOS  --  3.8  --   --    CBC  Recent Labs Lab 09/27/15 2154 09/29/15 0300 09/30/15 0227  WBC 15.0* 10.4 10.6*  HGB 15.7 14.5 14.6  HCT 46.3 42.7 43.3  PLT 242 192 195   Coag's No results for input(s): APTT, INR in the last 168 hours.  Sepsis Markers No results for input(s): LATICACIDVEN, PROCALCITON, O2SATVEN in the last 168 hours.  ABG  Recent Labs Lab 09/28/15 1013 09/29/15 0238 09/30/15 0445  PHART 7.332* 7.402 7.427  PCO2ART 45.6* 38.1 35.6  PO2ART 88.0 87.0 70.5*   Liver Enzymes  Recent Labs Lab 09/27/15 2154  AST 51*  ALT 70*  ALKPHOS 61  BILITOT 1.0  ALBUMIN 3.8   Cardiac Enzymes No results for input(s): TROPONINI, PROBNP in the  last 168 hours.  Glucose  Recent Labs Lab 09/29/15 1108 09/29/15 1518 09/29/15 1952 09/29/15 2336 09/30/15 0425 09/30/15 0745  GLUCAP 149* 168* 182* 199* 174* 201*   Imaging Dg Chest Port 1 View  Result Date: 09/30/2015 CLINICAL DATA:  Shortness of breath. EXAM: PORTABLE CHEST 1 VIEW COMPARISON:  09/29/2015. FINDINGS: Support tubes and lines appear stable. ET tube remains 4 cm above the carina in satisfactory position. Low lung volumes consistent with atelectasis. Hazy density noted yesterday at the LEFT lung base appears improved, with no evidence for effusion. Patchy opacity at the RIGHT lung base could represent edema or infiltrate. IMPRESSION: Slight worsening aeration at the  RIGHT lung base. LEFT effusion appears improved. Support tubes and lines stable. Electronically Signed   By: Elsie Stain M.D.   On: 09/30/2015 07:08   Dg Abd Portable 1v  Result Date: 09/29/2015 CLINICAL DATA:  Orogastric tube placement EXAM: PORTABLE ABDOMEN - 1 VIEW COMPARISON:  Portable exam 1742 hours compared to 09/28/2015 FINDINGS: Tip of nasogastric tube projects over gastric antrum. Atelectasis versus consolidation LEFT lower lobe with probable small LEFT pleural effusion. IMPRESSION: Tip of nasogastric tube projects over gastric antrum. Atelectasis versus consolidation LEFT lower lobe with probable associated LEFT pleural effusion. Electronically Signed   By: Ulyses Southward M.D.   On: 09/29/2015 18:20   STUDIES:  7/30: U tox: (+) amphetamines, benzo, and opitates 7/30: CXR improving atelectasis and vascular congestion  CULTURES: None  ANTIBIOTICS: None  SIGNIFICANT EVENTS: 7/29: ED presentation, psych consulted for suspected drug overdose and suicidal intent 7/30: Admit to CCM for airway management  LINES/TUBES: Urethral cath 7/30> ETT 7/30>8/1 PIV R forearm 7/30> Central line R neck 7/30> NG tube 7/30>  DISCUSSION: Ashley Montminy is a 39 yo male who presented with likely drug overdose with suicidal intent as below who came to the ED on 7/29 via EMS after being found unresponsive and apneic. Pt was alert and conscious in ED awaiting psych evaluation but then developed muffled phonation and found to have an enlarged uvula requiring intubation for airway protection.   ASSESSMENT / PLAN:  PULMONARY A: Acute Respiratory Failure due to upper airway compromise- uvula enlargement likely from allergic reaction. Unknown etiology of allergic rxn- ?lisinopril vs narcan vs food allergy Hx of OSA P:   Weaning to extubate today since cuff leak is present. Continuous pulse ox. O2 goal > 92%. D/C IV Benadryl 25 mg q6 Decrease solumedrol to 60 mg IV q12. D/C  pepcid.  CARDIOVASCULAR A:  No acute issues Hx of HTN Hx of heart valve disorder Hx of HLD P:  Vital signs per protocol Hold antihypertensives right now, AVOID LISINOPRIL   RENAL A:   Mild AKI- resolved Mild respiratory acidosis- resolved P:   KVO for now. Monitor electrolytes.  GASTROINTESTINAL A:   Nutrition P:   SLP per speech D/C pepcid IV. Diet per speech pathology.  HEMATOLOGIC A:   No acute issues P:  Daily CBC  INFECTIOUS A:   No acute issues P:   Monitor for fever  ENDOCRINE A:   Hx of DM P:   Hold home meds (NPH and Janumet) CBG q4 with resistant SSI  NEUROLOGIC A:   Multidrug ingestion with suicidal intent Hx of Anxiety and ADHD Hx of TIA Sedation P:   Psych called for suicidal ideation. Restart home psych meds (Xanax, adderral, celexa, klonipin, trazadone) once able to take PO. Hold home oxycodone D/C Propofol ggt D/C Nimbex ggt D/C Fentanyl PRN  FAMILY  -  Updates: Patient updated bedside.  Psych consult called.  - Inter-disciplinary family meet or Palliative Care meeting due by: August 6th  The patient is critically ill with multiple organ systems failure and requires high complexity decision making for assessment and support, frequent evaluation and titration of therapies, application of advanced monitoring technologies and extensive interpretation of multiple databases.   Critical Care Time devoted to patient care services described in this note is  35  Minutes. This time reflects time of care of this signee Dr Koren Bound. This critical care time does not reflect procedure time, or teaching time or supervisory time of PA/NP/Med student/Med Resident etc but could involve care discussion time.  Alyson Reedy, M.D. [facility] Pulmonary/Critical Care Medicine. Pager: [phone]. After hours pager: [phone].

## 2015-10-01 DIAGNOSIS — I10 Essential (primary) hypertension: Secondary | ICD-10-CM

## 2015-10-01 DIAGNOSIS — Z978 Presence of other specified devices: Secondary | ICD-10-CM

## 2015-10-01 LAB — MAGNESIUM: Magnesium: 2.2 mg/dL (ref 1.7–2.4)

## 2015-10-01 LAB — CBC
HEMATOCRIT: 40.9 % (ref 39.0–52.0)
Hemoglobin: 13.9 g/dL (ref 13.0–17.0)
MCH: 30.6 pg (ref 26.0–34.0)
MCHC: 34 g/dL (ref 30.0–36.0)
MCV: 90.1 fL (ref 78.0–100.0)
PLATELETS: 213 10*3/uL (ref 150–400)
RBC: 4.54 MIL/uL (ref 4.22–5.81)
RDW: 12.3 % (ref 11.5–15.5)
WBC: 14.7 10*3/uL — AB (ref 4.0–10.5)

## 2015-10-01 LAB — BASIC METABOLIC PANEL
ANION GAP: 8 (ref 5–15)
BUN: 17 mg/dL (ref 6–20)
CALCIUM: 8.9 mg/dL (ref 8.9–10.3)
CO2: 27 mmol/L (ref 22–32)
Chloride: 102 mmol/L (ref 101–111)
Creatinine, Ser: 0.84 mg/dL (ref 0.61–1.24)
GFR calc Af Amer: >60 mL/min (ref >60)
GFR calc non Af Amer: >60 mL/min (ref >60)
GLUCOSE: 161 mg/dL — AB (ref 65–99)
Potassium: 4.7 mmol/L (ref 3.5–5.1)
Sodium: 137 mmol/L (ref 135–145)

## 2015-10-01 LAB — GLUCOSE, CAPILLARY
GLUCOSE-CAPILLARY: 161 mg/dL — AB (ref 65–99)
GLUCOSE-CAPILLARY: 173 mg/dL — AB (ref 65–99)
GLUCOSE-CAPILLARY: 124 mg/dL — AB (ref 65–99)
Glucose-Capillary: 203 mg/dL — ABNORMAL HIGH (ref 65–99)

## 2015-10-01 LAB — PHOSPHORUS: Phosphorus: 3.2 mg/dL (ref 2.5–4.6)

## 2015-10-01 MED ORDER — METHYLPREDNISOLONE 4 MG PO TBPK: 8.0000 mg | ORAL_TABLET | Freq: Every evening | ORAL | Status: DC

## 2015-10-01 MED ORDER — METHYLPREDNISOLONE 4 MG PO TABS
4.0000 mg | ORAL_TABLET | Freq: Every day | ORAL | Status: DC
  Filled 2015-10-01: qty 1

## 2015-10-01 MED ORDER — METHYLPREDNISOLONE 4 MG PO TBPK: 4.0000 mg | ORAL_TABLET | Freq: Three times a day (TID) | ORAL | Status: DC

## 2015-10-01 MED ORDER — METHYLPREDNISOLONE 4 MG PO TBPK: 4.0000 mg | ORAL_TABLET | ORAL | Status: DC

## 2015-10-01 MED ORDER — METHYLPREDNISOLONE 4 MG PO TBPK: 4.0000 mg | ORAL_TABLET | Freq: Four times a day (QID) | ORAL | Status: DC

## 2015-10-01 MED ORDER — PREDNISONE 10 MG PO TABS: 10.0000 mg | ORAL_TABLET | Freq: Every day | ORAL | 0 refills | Status: DC

## 2015-10-01 MED ORDER — METHYLPREDNISOLONE 4 MG PO TBPK
8.0000 mg | ORAL_TABLET | Freq: Every morning | ORAL | Status: DC
Start: 2015-10-01 — End: 2015-10-01
  Filled 2015-10-01: qty 21

## 2015-10-01 NOTE — Evaluation (Signed)
Physical Therapy Evaluation and Discharge Patient Details Name: Bryan Gallegos MRN: [id number] DOB: 01-31-1976 Today's Date: 10/01/2015   History of Present Illness  40 yo male who presented with likely drug overdose with suicidal intent who came to the ED on 7/29 via EMS after being found unresponsive and apneic. Pt was alert and conscious in ED awaiting psych evaluation but then developed muffled phonation and found to have an enlarged uvula requiring intubation for airway protection 7/30-8/1.     Clinical Impression  Patient evaluated by Physical Therapy with no further acute PT needs identified. Ambulated without imbalance on room air with SaO2 97% and HR 105-115. PT is signing off. Thank you for this referral.     Follow Up Recommendations No PT follow up    Equipment Recommendations  None recommended by PT    Recommendations for Other Services       Precautions / Restrictions Precautions Precautions: None      Mobility  Bed Mobility                  Transfers Overall transfer level: Independent                  Ambulation/Gait Ambulation/Gait assistance: Independent Ambulation Distance (Feet): 150 Feet Assistive device: None Gait Pattern/deviations: WFL(Within Functional Limits)   Gait velocity interpretation: at or above normal speed for age/gender General Gait Details: able to vary speed up/down; sudden stops; head turns without imbalance  Stairs            Wheelchair Mobility    Modified Rankin (Stroke Patients Only)       Balance Overall balance assessment: Independent                                           Pertinent Vitals/Pain Pain Assessment: Faces Faces Pain Scale: No hurt    Home Living Family/patient expects to be discharged to:: Private residence Living Arrangements: Spouse/significant other;Children Available Help at Discharge: Family;Available PRN/intermittently Type of Home: House Home  Access: Level entry     Home Layout: One level Home Equipment: None      Prior Function Level of Independence: Independent               Hand Dominance        Extremity/Trunk Assessment   Upper Extremity Assessment: Overall WFL for tasks assessed           Lower Extremity Assessment: Overall WFL for tasks assessed      Cervical / Trunk Assessment: Other exceptions  Communication   Communication: No difficulties  Cognition Arousal/Alertness: Awake/alert Behavior During Therapy: WFL for tasks assessed/performed Overall Cognitive Status: Within Functional Limits for tasks assessed                      General Comments      Exercises        Assessment/Plan    PT Assessment Patent does not need any further PT services  PT Diagnosis     PT Problem List    PT Treatment Interventions     PT Goals (Current goals can be found in the Care Plan section) Acute Rehab PT Goals Patient Stated Goal: go home today PT Goal Formulation: All assessment and education complete, DC therapy    Frequency     Barriers to discharge  Co-evaluation               End of Session   Activity Tolerance: Patient tolerated treatment well Patient left: in chair;with call bell/phone within reach;with nursing/sitter in room           Time: 1610-9604 PT Time Calculation (min) (ACUTE ONLY): 10 min   Charges:   PT Evaluation $PT Eval Low Complexity: 1 Procedure     PT G Codes:        Liara Holm 2015-10-31, 10:05 AM  Pager [phone]

## 2015-10-01 NOTE — Progress Notes (Signed)
Patient discharged to home with wife and family after education and discharge instructions given and informed of px to his pharmacy for prednisone taper pack

## 2015-10-01 NOTE — Progress Notes (Signed)
Speech Language Pathology Treatment: Dysphagia  Patient Details Name: Bryan Gallegos MRN: [id number] DOB: 1975/10/29 Today's Date: 10/01/2015 Time: 1791-5056 SLP Time Calculation (min) (ACUTE ONLY): 11 min  Assessment / Plan / Recommendation Clinical Impression  Pt reports good tolerance of meals so far but with distaste for the thickened liquids. His voice is still hoarse but mildly improved from previous date. Pt drank thin liquids via straw with one immediate throat clear that pt said was in the setting of nasal regurgitation. Uvula looks like it's moving adequately, although it's possible that he does not have complete seal of his nasopharynx. Nothing was observed to escape from his nares. No further signs of difficulty were observed, even across challenging with the 3 ounce water test. Recommend to advance diet to regular textures and thin liquids. Will continue to monitor while in house.   HPI HPI: Bryan Gallegos who presented with likely drug overdose with suicidal intent as below who came to the ED on 7/29 via EMS after being found unresponsive and apneic. Pt was alert and conscious in ED awaiting psych evaluation but then developed muffled phonation and found to have an enlarged uvula requiring intubation for airway protection 7/30-8/1.       SLP Plan  Continue with current plan of care     Recommendations  Diet recommendations: Regular;Thin liquid Liquids provided via: Cup;Straw Medication Administration: Whole meds with liquid Supervision: Patient able to self feed;Intermittent supervision to cue for compensatory strategies Compensations: Slow rate;Small sips/bites Postural Changes and/or Swallow Maneuvers: Seated upright 90 degrees             Oral Care Recommendations: Oral care BID Follow up Recommendations: None Plan: Continue with current plan of care     GO                Bryan Gallegos 10/01/2015, 9:32 AM  Bryan Gallegos, M.A. CCC-SLP [phone]

## 2015-10-01 NOTE — Discharge Summary (Signed)
Physician Discharge Summary  Patient ID: Bryan Gallegos MRN: [id number] DOB/AGE: Dec 03, 1975 40 y.o.  Admit date: 09/27/2015 Discharge date: 10/01/2015    Discharge Diagnoses:  Acute Respiratory Failure due to upperairway compromise- uvula enlargement likely from allergic reaction.  Resolved and now s/p extubation 8/2. Hx of OSA - not on CPAP. HTN. HLD. DM. Multidrug ingestion - not suicide attempt.  Seen by psych and cleared for discharge. Hx of Anxiety, ADHD, TIA.                                                                      DISCHARGE PLAN BY DIAGNOSIS    Acute Respiratory Failure due to upperairway compromise- uvula enlargement likely from allergic reaction.  Resolved and now s/p extubation 8/2. Hx of OSA - not on CPAP. Plan: NO LISINOPRIL OR OTHER ACE INHIBITORS FOR REST OF LIFE!!! (ADDED TO ALLERGY LIST).  HTN, HLD. Plan: NO LISINOPRIL OR OTHER ACE INHIBITORS FOR REST OF LIFE!!! (ADDED TO ALLERGY LIST). Continue outpatient ASA.  DM Plan: Continue outpatient NPH and Janumet. Follow up with PCP.  Multidrug ingestion - not suicide attempt.  Seen by psych and cleared for discharge. Hx of Anxiety, ADHD, TIA. Plan: Continue outpatient meds. Drug abuse counseling.                DISCHARGE SUMMARY   Bryan Gallegos is a 40 y.o. y/o male with a PMH as below who came to the ED on 7/29 via EMS after being found unresponsive and apneic. EMS delivered 2 mg of Narcan x 2 and was transferred to [facility] ED. In ED, patient was alert and oriented and admitted to taking Oxycodone, Klonopin, and Xanax (1 of each). Denied any intentions of harming himself. Behavioral health was consulted and psych evaluation was placed (patient was recently admitted for multi drug ingestion with suicidal intent). Patient was held in ED overnight until psych could see him. In the morning, patient endorsed throat pain and was noted to have abnormal phonation. On exam he was found to have an  enlarged uvula. There were concerns of angioedema and airway compromise, PCCM was subsequently called. He required intubation for airway protection.  Due to enlarged uvula, intubation was performed over bronchoscope.  He was liberated from the ventilator on 09/30/15.  He was then evaluated by psychiatry and was deemed to not be a harm to himself or others and was cleared for discharge.  On 8/2, he was medically stable and cleared for discharge home.  Of note, he reports that he has had 4 episodes of "facial swelling" and despite this, has continued to be prescribed lisinopril.  We have added lisinopril to his allergy list.  PLEASE DO NOT PRESCRIBE LISINOPRIL OR OTHER ACE INHIBITOR TO THIS PATIENT!             SIGNIFICANT DIAGNOSTIC STUDIES 7/30 > U tox: (+) amphetamines, benzo, and opitates 7/30 > CXR improving atelectasis and vascular congestion  SIGNIFICANT EVENTS 7/29 > presented to ED, kept overnight, psych consulted. 7/30 > pt intubated for airway protection due to enlarged uvula. 8/1 > extubated.  Seen by psych and cleared for discharge. 8/2 >   MICRO DATA  None.  ANTIBIOTICS None.  CONSULTS Psychiatry.  TUBES / LINES ETT  7/30 > 8/1 CVL R IJ 7/30 > 8/2   Discharge Exam: General: Morbidly obese male, resting comfortably, in NAD. Neuro: Awake and interactive. Moves all extremities. HEENT: [place]/AT, PERRL, EOM-I and MMM. Cardiovascular: RRR, Nl S1/S2, -M/R/G. Lungs: Clear to auscultation bilaterally. Abdomen: Obese abdomen, soft, normal bowel sounds. Musculoskeletal: Normal muscle bulk, no edema. Skin: No rashes.   Vitals:   10/01/15 0800 10/01/15 0900 10/01/15 1000 10/01/15 1145  BP: (!) 107/53     Pulse: 80  86   Resp: '18 19 17   '$ Temp:    98.5 F (36.9 C)  TempSrc:    Oral  SpO2: 93%  97%   Weight:      Height:         Discharge Labs  BMET  Recent Labs Lab 09/27/15 2154 09/28/15 1800 09/29/15 0300 09/29/15 0736 09/30/15 0227 10/01/15 0443  NA  131* 133*  --  133* 134* 137  K 3.7 3.9  --  3.8 4.9 4.7  CL 100* 101  --  101 103 102  CO2 20* 24  --  '26 24 27  '$ GLUCOSE 270* 129*  --  145* 191* 161*  BUN 9 11  --  '11 17 17  '$ CREATININE 1.36* 0.87  --  0.88 0.93 0.84  CALCIUM 8.9 8.9  --  8.7* 9.0 8.9  MG  --   --  2.1  --   --  2.2  PHOS  --   --  3.8  --   --  3.2    CBC  Recent Labs Lab 09/29/15 0300 09/30/15 0227 10/01/15 0443  HGB 14.5 14.6 13.9  HCT 42.7 43.3 40.9  WBC 10.4 10.6* 14.7*  PLT 192 195 213    Anti-Coagulation No results for input(s): INR in the last 168 hours.  Discharge Instructions    Diet - low sodium heart healthy    Complete by:  As directed   Increase activity slowly    Complete by:  As directed         Medication List    TAKE these medications   albuterol 108 (90 Base) MCG/ACT inhaler Commonly known as:  PROVENTIL HFA;VENTOLIN HFA Inhale 2 puffs into the lungs every 6 (six) hours as needed for wheezing or shortness of breath.   alprazolam 2 MG tablet Commonly known as:  XANAX Take 2 mg by mouth 3 (three) times daily as needed for anxiety. For anxiety   amphetamine-dextroamphetamine 30 MG tablet Commonly known as:  ADDERALL Take 30 mg by mouth daily.   aspirin 325 MG tablet Take 1 tablet (325 mg total) by mouth daily. For heart health   citalopram 10 MG tablet Commonly known as:  CELEXA Take 1 tablet (10 mg total) by mouth daily. For depression   clonazePAM 1 MG tablet Commonly known as:  KLONOPIN Take 1 mg by mouth 2 (two) times daily.   dexlansoprazole 60 MG capsule Commonly known as:  DEXILANT Take 1 capsule (60 mg total) by mouth daily. For acid reflux What changed:  when to take this  reasons to take this  additional instructions   furosemide 20 MG tablet Commonly known as:  LASIX Take 1 tablet (20 mg total) by mouth daily. For swelling   gabapentin 400 MG capsule Commonly known as:  NEURONTIN Take 2 capsules (800 mg total) by mouth 3 (three) times daily.  For agitation   hydrOXYzine 50 MG capsule Commonly known as:  VISTARIL Take 1 capsule (50 mg total) by mouth  3 (three) times daily as needed for anxiety.   insulin NPH Human 100 UNIT/ML injection Commonly known as:  HUMULIN N Inject 0.01-0.04 mLs (1-4 Units total) into the skin daily before breakfast. For diabetes   ondansetron 8 MG tablet Commonly known as:  ZOFRAN Take 1 tablet (8 mg total) by mouth 4 (four) times daily -  before meals and at bedtime. Nausea   oxycodone 30 MG immediate release tablet Commonly known as:  ROXICODONE Take 30 mg by mouth every 4 (four) hours as needed for pain.   predniSONE 10 MG tablet Commonly known as:  DELTASONE Take 1 tablet (10 mg total) by mouth daily with breakfast.   sitaGLIPtin-metformin 50-1000 MG tablet Commonly known as:  JANUMET Take 1 tablet by mouth 2 (two) times daily with a meal. For diabetes   traZODone 50 MG tablet Commonly known as:  DESYREL Take 1 tablet (50 mg total) by mouth at bedtime. For sleep        Disposition: Home.  Discharged Condition: Bryan Gallegos has met maximum benefit of inpatient care and is medically stable and cleared for discharge.  Patient is pending follow up as above.      Time spent on disposition:  Greater than 35 minutes.   Montey Hora, [facility] Pgr: [phone]  or [phone]  Patient seen and examined, agree with above note.  I dictated the care and orders written for this patient under my direction.  Rush Farmer, MD [phone]

## 2015-10-01 NOTE — Care Management Important Message (Signed)
Important Message  Patient Details  Name: Bryan Gallegos MRN: [id number] Date of Birth: 1975-03-18   Medicare Important Message Given:  Yes    [name] Abena 10/01/2015, 10:05 AM

## 2015-10-01 NOTE — Progress Notes (Signed)
Patient evaluated on yesterday by Dr. Elsie Saas (Psychiatrist) with the following psych recommendations: Patient does not meet criteria for psychiatric inpatient admission. Patient to follow up with his outpatient psych/substance abuse provider.          Lance Muss, LCSW [facility] ED/4M Clinical Social Worker [phone]

## 2015-10-04 ENCOUNTER — Emergency Department (HOSPITAL_COMMUNITY): Payer: Medicare Other

## 2015-10-04 ENCOUNTER — Emergency Department (HOSPITAL_COMMUNITY)
Admission: EM | Admit: 2015-10-04 | Discharge: 2015-10-04 | Disposition: A | Discharge status: Home / Self Care | Payer: Medicare Other | Attending: Emergency Medicine | Admitting: Emergency Medicine

## 2015-10-04 ENCOUNTER — Encounter (HOSPITAL_COMMUNITY): Payer: Self-pay | Admitting: Radiology

## 2015-10-04 DIAGNOSIS — Z8673 Personal history of transient ischemic attack (TIA), and cerebral infarction without residual deficits: Secondary | ICD-10-CM | POA: Insufficient documentation

## 2015-10-04 DIAGNOSIS — R0602 Shortness of breath: Secondary | ICD-10-CM | POA: Diagnosis present

## 2015-10-04 DIAGNOSIS — Z79899 Other long term (current) drug therapy: Secondary | ICD-10-CM | POA: Insufficient documentation

## 2015-10-04 DIAGNOSIS — J45909 Unspecified asthma, uncomplicated: Secondary | ICD-10-CM | POA: Diagnosis not present

## 2015-10-04 DIAGNOSIS — Z794 Long term (current) use of insulin: Secondary | ICD-10-CM | POA: Diagnosis not present

## 2015-10-04 DIAGNOSIS — I10 Essential (primary) hypertension: Secondary | ICD-10-CM | POA: Diagnosis not present

## 2015-10-04 DIAGNOSIS — F17210 Nicotine dependence, cigarettes, uncomplicated: Secondary | ICD-10-CM | POA: Diagnosis not present

## 2015-10-04 DIAGNOSIS — Z7982 Long term (current) use of aspirin: Secondary | ICD-10-CM | POA: Insufficient documentation

## 2015-10-04 DIAGNOSIS — R070 Pain in throat: Secondary | ICD-10-CM | POA: Insufficient documentation

## 2015-10-04 DIAGNOSIS — Z7984 Long term (current) use of oral hypoglycemic drugs: Secondary | ICD-10-CM | POA: Diagnosis not present

## 2015-10-04 DIAGNOSIS — E119 Type 2 diabetes mellitus without complications: Secondary | ICD-10-CM | POA: Insufficient documentation

## 2015-10-04 LAB — BASIC METABOLIC PANEL
ANION GAP: 10 (ref 5–15)
BUN: 7 mg/dL (ref 6–20)
CHLORIDE: 103 mmol/L (ref 101–111)
CO2: 25 mmol/L (ref 22–32)
CREATININE: 0.71 mg/dL (ref 0.61–1.24)
Calcium: 8.6 mg/dL — ABNORMAL LOW (ref 8.9–10.3)
GFR calc non Af Amer: >60 mL/min (ref >60)
Glucose, Bld: 158 mg/dL — ABNORMAL HIGH (ref 65–99)
POTASSIUM: 4.6 mmol/L (ref 3.5–5.1)
SODIUM: 138 mmol/L (ref 135–145)

## 2015-10-04 LAB — CBC WITH DIFFERENTIAL/PLATELET
BASOS ABS: 0 10*3/uL (ref 0.0–0.1)
Basophils Relative: 0 %
EOS PCT: 2 %
Eosinophils Absolute: 0.3 10*3/uL (ref 0.0–0.7)
HCT: 40.4 % (ref 39.0–52.0)
HEMOGLOBIN: 13.8 g/dL (ref 13.0–17.0)
LYMPHS ABS: 5.2 10*3/uL — AB (ref 0.7–4.0)
LYMPHS PCT: 34 %
MCH: 31.2 pg (ref 26.0–34.0)
MCHC: 34.2 g/dL (ref 30.0–36.0)
MCV: 91.4 fL (ref 78.0–100.0)
MONO ABS: 1.2 10*3/uL — AB (ref 0.1–1.0)
MONOS PCT: 8 %
NEUTROS ABS: 8.6 10*3/uL — AB (ref 1.7–7.7)
NEUTROS PCT: 56 %
Platelets: UNDETERMINED 10*3/uL (ref 150–400)
RBC: 4.42 MIL/uL (ref 4.22–5.81)
RDW: 12.8 % (ref 11.5–15.5)
WBC: 15.3 10*3/uL — AB (ref 4.0–10.5)

## 2015-10-04 LAB — RAPID URINE DRUG SCREEN, HOSP PERFORMED
Amphetamines: NOT DETECTED
Barbiturates: NOT DETECTED
Benzodiazepines: POSITIVE — AB
Cocaine: NOT DETECTED
OPIATES: POSITIVE — AB
TETRAHYDROCANNABINOL: NOT DETECTED

## 2015-10-04 LAB — RAPID STREP SCREEN (MED CTR MEBANE ONLY): Streptococcus, Group A Screen (Direct): NEGATIVE

## 2015-10-04 LAB — I-STAT TROPONIN, ED: TROPONIN I, POC: 0 ng/mL (ref 0.00–0.08)

## 2015-10-04 MED ORDER — EPINEPHRINE 0.3 MG/0.3ML IJ SOAJ
0.3000 mg | Freq: Once | INTRAMUSCULAR | Status: AC
  Administered 2015-10-04: 0.3 mg via INTRAMUSCULAR
  Filled 2015-10-04: qty 0.3

## 2015-10-04 MED ORDER — SULFAMETHOXAZOLE-TRIMETHOPRIM 800-160 MG PO TABS: 1.0000 | ORAL_TABLET | Freq: Two times a day (BID) | ORAL | 0 refills | Status: DC

## 2015-10-04 MED ORDER — KETOROLAC TROMETHAMINE 30 MG/ML IJ SOLN
30.0000 mg | Freq: Once | INTRAMUSCULAR | Status: AC
  Administered 2015-10-04: 30 mg via INTRAVENOUS
  Filled 2015-10-04: qty 1

## 2015-10-04 MED ORDER — FAMOTIDINE IN NACL 20-0.9 MG/50ML-% IV SOLN
20.0000 mg | Freq: Once | INTRAVENOUS | Status: AC
  Administered 2015-10-04: 20 mg via INTRAVENOUS
  Filled 2015-10-04 (×2): qty 50

## 2015-10-04 MED ORDER — DIPHENHYDRAMINE HCL 50 MG/ML IJ SOLN
25.0000 mg | Freq: Once | INTRAMUSCULAR | Status: DC
  Filled 2015-10-04 (×2): qty 1

## 2015-10-04 MED ORDER — NALOXONE HCL 0.4 MG/ML IJ SOLN: 0.4000 mg | Freq: Once | INTRAMUSCULAR | Status: DC

## 2015-10-04 MED ORDER — IOPAMIDOL (ISOVUE-300) INJECTION 61%
INTRAVENOUS | Status: AC
  Administered 2015-10-04: 75 mL
  Filled 2015-10-04: qty 75

## 2015-10-04 NOTE — ED Notes (Signed)
Pt to CT at this time.

## 2015-10-04 NOTE — ED Provider Notes (Signed)
[facility]-EMERGENCY DEPT Provider Note   CSN: [id number] Arrival date & time: 10/04/15  1430  First Provider Contact:  10/04/15 3:23 PM      History   Chief Complaint Chief Complaint  Patient presents with  . Shortness of Breath    HPI Bryan Gallegos is a 40 y.o. male who presents with SOB and throat pain. PMH significant for recent admission due to anaphylactic reaction from being on Lisinopril, asthma, DM, HTN, HLD, OSA, morbid obesity, anxiety, bipolar d/o, polysubstance abuse. He was intubated for over 24 hours during his last hospital stay and put on Prednisone  which he states he is allergic to although he states that his reaction is a rash. Over the last several days he reports increasing difficulty breathing as well as a sore throat. He reports a subjective fever and chills. He feels as though he wants to cough but can't and is having difficulty swallowing. He also has chest pain which he attributes to the ET tube. Denies hx of pulmonary or cardiac disease. Denies abdominal pain, N/V/D.  HPI  Past Medical History:  Diagnosis Date  . Adult ADHD   . Anaphylactic reaction   . Anxiety   . Asthma   . Bipolar disorder (HCC)   . Complication of anesthesia    Per patient difficult intubation;  . Diabetes mellitus   . Difficult intubation    Per patient  . Heart valve disorder    s/p echocardiogram  . Hyperlipidemia   . Hypertension   . Morbidly obese (HCC)   . OSA (obstructive sleep apnea)   . Transient cerebral ischemia    Unknown    Patient Active Problem List   Diagnosis Date Noted  . Endotracheally intubated   . Uvular swelling 09/28/2015  . Acute respiratory failure (HCC) 09/28/2015  . OD (overdose of drug) 09/14/2015  . Major depressive disorder, recurrent severe without psychotic features (HCC) 09/04/2015  . Polysubstance dependence including opioid type drug, episodic abuse (HCC) 09/04/2015  . Left-sided weakness 08/21/2015  . Vomiting 08/21/2015  .  Epigastric pain   . RUQ abdominal pain   . Intractable vomiting 05/17/2015  . Essential hypertension 05/17/2015  . Abdominal pain 05/17/2015  . Cholelithiasis 05/17/2015  . Malingering 04/11/2015  . Antisocial personality disorder 04/11/2015  . Opioid use disorder, severe, dependence (HCC) 04/11/2015  . Benzodiazepine dependence (HCC) 04/11/2015  . Opioid overdose 04/11/2015  . Overdose 04/11/2015  . Anoxic-ischemic encephalopathy (HCC) 04/11/2015  . Altered mental status   . Hypoxic brain injury (HCC)   . HTN (hypertension) 04/10/2015  . Tobacco use disorder 04/10/2015  . Narcotic dependency, continuous (HCC) 03/14/2014  . Chronic back pain greater than 3 months duration 01/28/2014  . Gastroesophageal reflux disease with esophagitis 01/28/2014  . Essential hypertension, benign 01/28/2014  . Asthma, mild persistent 01/28/2014  . Diabetes mellitus type II, controlled (HCC) 01/28/2014  . Morbid obesity (HCC) 01/28/2014  . TIA (transient ischemic attack) 09/02/2013  . HLD (hyperlipidemia) 08/06/2013  . OSA (obstructive sleep apnea) 08/06/2013    Past Surgical History:  Procedure Laterality Date  . CARPAL TUNNEL RELEASE    . ESOPHAGOGASTRODUODENOSCOPY N/A 04/05/2015   Procedure: ESOPHAGOGASTRODUODENOSCOPY (EGD);  Surgeon: Malissa Hippo, MD;  Location: [facility];  Service: Endoscopy;  Laterality: N/A;  . ESOPHAGOGASTRODUODENOSCOPY (EGD) WITH PROPOFOL N/A 05/21/2015   Procedure: ESOPHAGOGASTRODUODENOSCOPY (EGD) WITH PROPOFOL;  Surgeon: Meryl Dare, MD;  Location: [facility];  Service: Endoscopy;  Laterality: N/A;  . NOSE SURGERY    .  TOOTH EXTRACTION    . WISDOM TOOTH EXTRACTION       Home Medications    Prior to Admission medications   Medication Sig Start Date End Date Taking? Authorizing Provider  albuterol (PROVENTIL HFA;VENTOLIN HFA) 108 (90 Base) MCG/ACT inhaler Inhale 2 puffs into the lungs every 6 (six) hours as needed for wheezing or shortness of breath.  09/05/15   Sanjuana Kava, NP  alprazolam Prudy Feeler) 2 MG tablet Take 2 mg by mouth 3 (three) times daily as needed for anxiety. For anxiety 09/09/15   Historical Provider, MD  amphetamine-dextroamphetamine (ADDERALL) 30 MG tablet Take 30 mg by mouth daily.    Historical Provider, MD  aspirin 325 MG tablet Take 1 tablet (325 mg total) by mouth daily. For heart health 09/05/15   Sanjuana Kava, NP  citalopram (CELEXA) 10 MG tablet Take 1 tablet (10 mg total) by mouth daily. For depression 09/05/15   Sanjuana Kava, NP  clonazePAM (KLONOPIN) 1 MG tablet Take 1 mg by mouth 2 (two) times daily. 09/25/15   Historical Provider, MD  dexlansoprazole (DEXILANT) 60 MG capsule Take 1 capsule (60 mg total) by mouth daily. For acid reflux Patient taking differently: Take 60 mg by mouth daily as needed. For acid reflux 09/05/15   Sanjuana Kava, NP  furosemide (LASIX) 20 MG tablet Take 1 tablet (20 mg total) by mouth daily. For swelling 09/05/15   Sanjuana Kava, NP  gabapentin (NEURONTIN) 400 MG capsule Take 2 capsules (800 mg total) by mouth 3 (three) times daily. For agitation 09/05/15   Sanjuana Kava, NP  hydrOXYzine (VISTARIL) 50 MG capsule Take 1 capsule (50 mg total) by mouth 3 (three) times daily as needed for anxiety. 09/05/15   Sanjuana Kava, NP  insulin NPH Human (HUMULIN N) 100 UNIT/ML injection Inject 0.01-0.04 mLs (1-4 Units total) into the skin daily before breakfast. For diabetes 09/05/15   Sanjuana Kava, NP  ondansetron (ZOFRAN) 8 MG tablet Take 1 tablet (8 mg total) by mouth 4 (four) times daily -  before meals and at bedtime. Nausea 09/05/15   Sanjuana Kava, NP  oxycodone (ROXICODONE) 30 MG immediate release tablet Take 30 mg by mouth every 4 (four) hours as needed for pain.    Historical Provider, MD  predniSONE (DELTASONE) 10 MG tablet Take 1 tablet (10 mg total) by mouth daily with breakfast. 10/01/15   Rahul P Desai, PA-C  sitaGLIPtin-metformin (JANUMET) 50-1000 MG tablet Take 1 tablet by mouth 2 (two) times daily with a  meal. For diabetes 09/05/15   Sanjuana Kava, NP  traZODone (DESYREL) 50 MG tablet Take 1 tablet (50 mg total) by mouth at bedtime. For sleep 09/05/15   Sanjuana Kava, NP    Family History Family History  Problem Relation Age of Onset  . CAD Mother     Living  . Diabetes Mellitus II Mother   . Stroke Mother   . Hypertension Mother   . Congestive Heart Failure Mother   . Kidney disease Mother   . Fibromyalgia Mother   . Thyroid disease Mother   . Hyperlipidemia Mother   . Liver disease Mother   . Alcoholism Father 68    Deceased  . Arthritis Maternal Grandmother   . Congestive Heart Failure Maternal Grandmother   . Hypertension Maternal Grandmother   . Lung cancer Maternal Grandfather   . Colon cancer Maternal Aunt   . Stomach cancer Maternal Aunt   . Heart disease Other  Paternal & Maternal  . Crohn's disease Other   . Hypertension Other     Paternal & Maternal  . Hypertension Brother     x3  . Hypertension Sister     #1  . Bipolar disorder Sister     #1  . ADD / ADHD Son     x3  . Bipolar disorder Son     x3  . Asperger's syndrome Son     Social History Social History  Substance Use Topics  . Smoking status: Current Every Day Smoker    Packs/day: 0.50    Years: 23.00    Types: Cigarettes  . Smokeless tobacco: Never Used  . Alcohol use No     Allergies   Atenolol; Lisinopril; Prednisone; and Tylenol [acetaminophen]   Review of Systems Review of Systems  Constitutional: Positive for chills and fever.  HENT: Positive for sore throat, trouble swallowing and voice change.   Respiratory: Positive for shortness of breath and stridor.   Cardiovascular: Positive for chest pain.  Gastrointestinal: Negative for abdominal pain, nausea and vomiting.  All other systems reviewed and are negative.    Physical Exam Updated Vital Signs BP 162/85 (BP Location: Right Arm)   Pulse 116   Temp 98.3 F (36.8 C) (Oral)   Resp 24   SpO2 94%   Physical Exam    Constitutional: He is oriented to person, place, and time. He appears well-developed and well-nourished. No distress.  Morbidly obese; hoarse voice - not stridorous  HENT:  Head: Normocephalic and atraumatic.  Mouth/Throat: Uvula is midline, oropharynx is clear and moist and mucous membranes are normal.  Eyes: Conjunctivae are normal. Pupils are equal, round, and reactive to light. Right eye exhibits no discharge. Left eye exhibits no discharge. No scleral icterus.  Neck: Normal range of motion. Neck supple.  Cardiovascular: Normal rate and regular rhythm.   No murmur heard. Pulmonary/Chest: Effort normal. No respiratory distress. He has decreased breath sounds. He has no wheezes. He has no rales. He exhibits no tenderness.  Abdominal: Soft. He exhibits no distension. There is no tenderness.  Musculoskeletal: He exhibits no edema.  Neurological: He is alert and oriented to person, place, and time.  Skin: Skin is warm and dry. He is not diaphoretic.  Ulcerated area with sounding erythema and tenderness over the right posterior calf  Psychiatric: He has a normal mood and affect.  Nursing note and vitals reviewed.    ED Treatments / Results  Labs (all labs ordered are listed, but only abnormal results are displayed) Labs Reviewed  BASIC METABOLIC PANEL - Abnormal; Notable for the following:       Result Value   Glucose, Bld 158 (*)    Calcium 8.6 (*)    All other components within normal limits  CBC WITH DIFFERENTIAL/PLATELET - Abnormal; Notable for the following:    WBC 15.3 (*)    Neutro Abs 8.6 (*)    Lymphs Abs 5.2 (*)    Monocytes Absolute 1.2 (*)    All other components within normal limits  URINE RAPID DRUG SCREEN, HOSP PERFORMED - Abnormal; Notable for the following:    Opiates POSITIVE (*)    Benzodiazepines POSITIVE (*)    All other components within normal limits  RAPID STREP SCREEN (NOT AT [facility])  CULTURE, GROUP A STREP ([facility])  I-STAT TROPOININ, ED    EKG  EKG  Interpretation None       Radiology Dg Chest 2 View  Result Date: 10/04/2015 CLINICAL  DATA:  40 year old male with shortness of breath. Pain in the airway. EXAM: CHEST  2 VIEW COMPARISON:  Chest x-ray a 03/2015. FINDINGS: Previously noted endotracheal tube has been removed. Lung volumes are low. No consolidative airspace disease. No pleural effusions. No pneumothorax. No pulmonary nodule or mass noted. Pulmonary vasculature and the cardiomediastinal silhouette are within normal limits. IMPRESSION: 1. Low lung volumes without radiographic evidence of acute cardiopulmonary disease. Electronically Signed   By: Trudie Reed M.D.   On: 10/04/2015 16:11   Ct Soft Tissue Neck W Contrast  Result Date: 10/04/2015 CLINICAL DATA:  Throat tightness. Midline sternal pain. Recent ER visit for allergic reaction. EXAM: CT NECK WITH CONTRAST TECHNIQUE: Multidetector CT imaging of the neck was performed using the standard protocol following the bolus administration of intravenous contrast. CONTRAST:  10mL ISOVUE-300 IOPAMIDOL (ISOVUE-300) INJECTION 61% COMPARISON:  CT head without contrast 08/21/2015. FINDINGS: Pharynx and larynx: No focal mucosal or submucosal lesions are present. The nasopharynx, oropharynx, and hypopharynx are clear. Vocal cords are midline and symmetric. Salivary glands: The submandibular and parotid glands are within normal limits bilaterally. Thyroid: Unremarkable Lymph nodes: No significant cervical adenopathy is present. Prominent jugulodigastric lymph nodes bilaterally are within normal limits. Vascular: No focal vascular lesions are present. Limited intracranial: Within normal limits. Visualized orbits: Within normal limits Mastoids and visualized paranasal sinuses: The left sphenoid sinus is opacified. There is moderate mucosal thickening throughout the right sphenoid sinus. The visualized anterior paranasal sinuses are clear. Skeleton: No focal lytic or blastic lesions are present.  Vertebral body heights and alignment are within normal limits. Upper chest: The lung apices are clear. IMPRESSION: 1. No acute or focal lesion to explain the patient's symptoms. 2. Negative CT of the neck. Electronically Signed   By: Marin Roberts M.D.   On: 10/04/2015 19:02    Procedures Procedures (including critical care time)  Medications Ordered in ED Medications  diphenhydrAMINE (BENADRYL) injection 25 mg (0 mg Intravenous Hold 10/04/15 1922)  naloxone [facility]) injection 0.4 mg (0 mg Intravenous Hold 10/04/15 1912)  EPINEPHrine (EPI-PEN) injection 0.3 mg (0.3 mg Intramuscular Given 10/04/15 1631)  famotidine (PEPCID) IVPB 20 mg premix (0 mg Intravenous Stopped 10/04/15 1942)  iopamidol (ISOVUE-300) 61 % injection (75 mLs  Contrast Given 10/04/15 1819)  ketorolac (TORADOL) 30 MG/ML injection 30 mg (30 mg Intravenous Given 10/04/15 1909)     Initial Impression / Assessment and Plan / ED Course  I have reviewed the triage vital signs and the nursing notes.  Pertinent labs & imaging results that were available during my care of the patient were reviewed by me and considered in my medical decision making (see chart for details).  Clinical Course   40 year old male presents with SOB. Unclear etiology. Possibly due to body habitus and polypharmacy. On review of meds he has several sedating meds. He takes Xanax, Klonopin, Benadryl, Hydroxyzine, Oxycodone, and Trazodone. Unclear if he is taking all of these meds. He is tachycardic at times and sats drop in the low 90s when he is sleeping. Walking O2 sats are >94% on RA. CBC is remarkable for leukocytosis of 15.3 however he is afebrile and is on steroids presently. BMP unremarkable. Troponin is 0. EKG is sinus tachycardia. CXR shows hypoinflation. CT neck is unremarkable. UDS remarkable for +benzos and opiates. CP and throat pain is most likely due to ET tube. Will d/c with antibitoic for possible skin infection on leg. Patient is NAD, non-toxic, with  stable VS. Patient is informed of clinical  course, understands medical decision making process, and agrees with plan. Opportunity for questions provided and all questions answered. Return precautions given.   Final Clinical Impressions(s) / ED Diagnoses   Final diagnoses:  SOB (shortness of breath)  Throat pain in adult    New Prescriptions Discharge Medication List as of 10/04/2015  9:02 PM    START taking these medications   Details  sulfamethoxazole-trimethoprim (BACTRIM DS,SEPTRA DS) 800-160 MG tablet Take 1 tablet by mouth 2 (two) times daily., Starting Sat 10/04/2015, Until Sat 10/11/2015, Print         Bethel Born, PA-C 10/04/15 2346    Geoffery Lyons, MD 10/04/15 2356

## 2015-10-04 NOTE — ED Notes (Signed)
Pt up to bathroom without any problems 

## 2015-10-04 NOTE — ED Notes (Signed)
Pt requesting pain meds for his back pain.  Dr. Judd Lien made aware

## 2015-10-04 NOTE — ED Notes (Signed)
Pt returned from CT, up to bathroom at this time

## 2015-10-04 NOTE — ED Notes (Signed)
Pt returned from bathroom st's he is having trouble breathing.  Pt placed on 02 via [place] at 2LPM

## 2015-10-04 NOTE — ED Notes (Signed)
Pt appears sleepy but will respond to verbal stimuli then goes back to sleep.  Wife sleeping at bedside.

## 2015-10-04 NOTE — ED Notes (Signed)
IV attempted x's 3 without success 

## 2015-10-04 NOTE — ED Notes (Signed)
CT made aware of IV placement.

## 2015-10-04 NOTE — ED Notes (Signed)
IV team paged for IV access.

## 2015-10-04 NOTE — ED Notes (Signed)
IV team at bedside 

## 2015-10-04 NOTE — ED Notes (Signed)
Ambulated pt in hallway with pulse oximetry attached. SpO2 maintained >94% entire time. No reports of dizziness, weakness.

## 2015-10-04 NOTE — ED Triage Notes (Signed)
Pt. BIB [facility] with complaints of tightness in his throat and midline sternal pain. Sats 91% RA. Was here on the 8/2 for unresponsive allergic reaction. Lung sounds clear, some noise in lower lobes ASA 324 on truck with albuteral for some wheezing in lower lung fields

## 2015-10-07 LAB — CULTURE, GROUP A STREP (THRC)

## 2015-10-08 ENCOUNTER — Inpatient Hospital Stay (HOSPITAL_COMMUNITY)
Admission: EM | Admit: 2015-10-08 | Discharge: 2015-10-13 | DRG: 154 | Disposition: A | Discharge status: Home / Self Care | Payer: Medicare Other | Attending: Family Medicine | Admitting: Family Medicine

## 2015-10-08 ENCOUNTER — Emergency Department (HOSPITAL_COMMUNITY): Payer: Medicare Other

## 2015-10-08 ENCOUNTER — Encounter (HOSPITAL_COMMUNITY): Payer: Self-pay | Admitting: *Deleted

## 2015-10-08 ENCOUNTER — Emergency Department (HOSPITAL_BASED_OUTPATIENT_CLINIC_OR_DEPARTMENT_OTHER): Admit: 2015-10-08 | Discharge: 2015-10-08 | Disposition: A | Discharge status: Home / Self Care | Payer: Medicare Other

## 2015-10-08 DIAGNOSIS — Z8249 Family history of ischemic heart disease and other diseases of the circulatory system: Secondary | ICD-10-CM

## 2015-10-08 DIAGNOSIS — Z7982 Long term (current) use of aspirin: Secondary | ICD-10-CM

## 2015-10-08 DIAGNOSIS — J449 Chronic obstructive pulmonary disease, unspecified: Secondary | ICD-10-CM | POA: Diagnosis present

## 2015-10-08 DIAGNOSIS — J9602 Acute respiratory failure with hypercapnia: Secondary | ICD-10-CM | POA: Diagnosis present

## 2015-10-08 DIAGNOSIS — F602 Antisocial personality disorder: Secondary | ICD-10-CM | POA: Diagnosis present

## 2015-10-08 DIAGNOSIS — Z886 Allergy status to analgesic agent status: Secondary | ICD-10-CM

## 2015-10-08 DIAGNOSIS — J45901 Unspecified asthma with (acute) exacerbation: Secondary | ICD-10-CM | POA: Diagnosis present

## 2015-10-08 DIAGNOSIS — F419 Anxiety disorder, unspecified: Secondary | ICD-10-CM | POA: Diagnosis present

## 2015-10-08 DIAGNOSIS — Z9989 Dependence on other enabling machines and devices: Secondary | ICD-10-CM

## 2015-10-08 DIAGNOSIS — M7989 Other specified soft tissue disorders: Secondary | ICD-10-CM | POA: Diagnosis not present

## 2015-10-08 DIAGNOSIS — Z8673 Personal history of transient ischemic attack (TIA), and cerebral infarction without residual deficits: Secondary | ICD-10-CM

## 2015-10-08 DIAGNOSIS — E1165 Type 2 diabetes mellitus with hyperglycemia: Secondary | ICD-10-CM | POA: Diagnosis present

## 2015-10-08 DIAGNOSIS — L089 Local infection of the skin and subcutaneous tissue, unspecified: Secondary | ICD-10-CM

## 2015-10-08 DIAGNOSIS — Z833 Family history of diabetes mellitus: Secondary | ICD-10-CM

## 2015-10-08 DIAGNOSIS — K2100 Gastro-esophageal reflux disease with esophagitis, without bleeding: Secondary | ICD-10-CM | POA: Diagnosis present

## 2015-10-08 DIAGNOSIS — Z79899 Other long term (current) drug therapy: Secondary | ICD-10-CM

## 2015-10-08 DIAGNOSIS — F332 Major depressive disorder, recurrent severe without psychotic features: Secondary | ICD-10-CM

## 2015-10-08 DIAGNOSIS — G4733 Obstructive sleep apnea (adult) (pediatric): Secondary | ICD-10-CM | POA: Diagnosis present

## 2015-10-08 DIAGNOSIS — R490 Dysphonia: Secondary | ICD-10-CM

## 2015-10-08 DIAGNOSIS — J383 Other diseases of vocal cords: Principal | ICD-10-CM | POA: Diagnosis present

## 2015-10-08 DIAGNOSIS — F17200 Nicotine dependence, unspecified, uncomplicated: Secondary | ICD-10-CM | POA: Diagnosis present

## 2015-10-08 DIAGNOSIS — F331 Major depressive disorder, recurrent, moderate: Secondary | ICD-10-CM | POA: Diagnosis present

## 2015-10-08 DIAGNOSIS — G459 Transient cerebral ischemic attack, unspecified: Secondary | ICD-10-CM

## 2015-10-08 DIAGNOSIS — F17210 Nicotine dependence, cigarettes, uncomplicated: Secondary | ICD-10-CM | POA: Diagnosis present

## 2015-10-08 DIAGNOSIS — E872 Acidosis: Secondary | ICD-10-CM | POA: Diagnosis present

## 2015-10-08 DIAGNOSIS — E119 Type 2 diabetes mellitus without complications: Secondary | ICD-10-CM

## 2015-10-08 DIAGNOSIS — J9601 Acute respiratory failure with hypoxia: Secondary | ICD-10-CM | POA: Diagnosis not present

## 2015-10-08 DIAGNOSIS — E785 Hyperlipidemia, unspecified: Secondary | ICD-10-CM | POA: Diagnosis present

## 2015-10-08 DIAGNOSIS — F909 Attention-deficit hyperactivity disorder, unspecified type: Secondary | ICD-10-CM | POA: Diagnosis present

## 2015-10-08 DIAGNOSIS — J4530 Mild persistent asthma, uncomplicated: Secondary | ICD-10-CM

## 2015-10-08 DIAGNOSIS — Z9119 Patient's noncompliance with other medical treatment and regimen: Secondary | ICD-10-CM

## 2015-10-08 DIAGNOSIS — Z888 Allergy status to other drugs, medicaments and biological substances status: Secondary | ICD-10-CM

## 2015-10-08 DIAGNOSIS — I10 Essential (primary) hypertension: Secondary | ICD-10-CM | POA: Diagnosis not present

## 2015-10-08 DIAGNOSIS — Z6841 Body Mass Index (BMI) 40.0 and over, adult: Secondary | ICD-10-CM

## 2015-10-08 DIAGNOSIS — J9691 Respiratory failure, unspecified with hypoxia: Secondary | ICD-10-CM | POA: Diagnosis present

## 2015-10-08 DIAGNOSIS — T148XXA Other injury of unspecified body region, initial encounter: Secondary | ICD-10-CM

## 2015-10-08 DIAGNOSIS — Z794 Long term (current) use of insulin: Secondary | ICD-10-CM

## 2015-10-08 DIAGNOSIS — F319 Bipolar disorder, unspecified: Secondary | ICD-10-CM | POA: Diagnosis present

## 2015-10-08 DIAGNOSIS — L03115 Cellulitis of right lower limb: Secondary | ICD-10-CM | POA: Diagnosis present

## 2015-10-08 DIAGNOSIS — Z7952 Long term (current) use of systemic steroids: Secondary | ICD-10-CM

## 2015-10-08 DIAGNOSIS — T148 Other injury of unspecified body region: Secondary | ICD-10-CM

## 2015-10-08 LAB — TROPONIN I

## 2015-10-08 LAB — CBC WITH DIFFERENTIAL/PLATELET
Basophils Absolute: 0 10*3/uL (ref 0.0–0.1)
Basophils Relative: 0 %
EOS ABS: 0.1 10*3/uL (ref 0.0–0.7)
EOS PCT: 2 %
HCT: 40.7 % (ref 39.0–52.0)
HEMOGLOBIN: 13.8 g/dL (ref 13.0–17.0)
LYMPHS ABS: 1.6 10*3/uL (ref 0.7–4.0)
Lymphocytes Relative: 21 %
MCH: 30.9 pg (ref 26.0–34.0)
MCHC: 33.9 g/dL (ref 30.0–36.0)
MCV: 91.1 fL (ref 78.0–100.0)
MONOS PCT: 6 %
Monocytes Absolute: 0.5 10*3/uL (ref 0.1–1.0)
NEUTROS PCT: 71 %
Neutro Abs: 5.5 10*3/uL (ref 1.7–7.7)
Platelets: 194 10*3/uL (ref 150–400)
RBC: 4.47 MIL/uL (ref 4.22–5.81)
RDW: 13 % (ref 11.5–15.5)
WBC: 7.8 10*3/uL (ref 4.0–10.5)

## 2015-10-08 LAB — BLOOD GAS, ARTERIAL
Acid-Base Excess: 3.5 mmol/L — ABNORMAL HIGH (ref 0.0–2.0)
Bicarbonate: 30.8 mEq/L — ABNORMAL HIGH (ref 20.0–24.0)
Drawn by: 422461
O2 CONTENT: 4 L/min
O2 Saturation: 93.9 %
PCO2 ART: 62.5 mmHg — AB (ref 35.0–45.0)
PH ART: 7.313 — AB (ref 7.350–7.450)
Patient temperature: 98.5
TCO2: 28.2 mmol/L (ref 0–100)
pO2, Arterial: 78.3 mmHg — ABNORMAL LOW (ref 80.0–100.0)

## 2015-10-08 LAB — BASIC METABOLIC PANEL
Anion gap: 7 (ref 5–15)
BUN: 8 mg/dL (ref 6–20)
CALCIUM: 8.9 mg/dL (ref 8.9–10.3)
CO2: 31 mmol/L (ref 22–32)
CREATININE: 0.69 mg/dL (ref 0.61–1.24)
Chloride: 102 mmol/L (ref 101–111)
GFR calc Af Amer: >60 mL/min (ref >60)
Glucose, Bld: 143 mg/dL — ABNORMAL HIGH (ref 65–99)
Potassium: 4 mmol/L (ref 3.5–5.1)
SODIUM: 140 mmol/L (ref 135–145)

## 2015-10-08 LAB — I-STAT TROPONIN, ED: TROPONIN I, POC: 0 ng/mL (ref 0.00–0.08)

## 2015-10-08 LAB — GLUCOSE, CAPILLARY: GLUCOSE-CAPILLARY: 155 mg/dL — AB (ref 65–99)

## 2015-10-08 LAB — BRAIN NATRIURETIC PEPTIDE: B Natriuretic Peptide: 102 pg/mL — ABNORMAL HIGH (ref 0.0–100.0)

## 2015-10-08 MED ORDER — ALBUTEROL SULFATE (2.5 MG/3ML) 0.083% IN NEBU
5.0000 mg | INHALATION_SOLUTION | Freq: Once | RESPIRATORY_TRACT | Status: AC
  Administered 2015-10-08: 5 mg via RESPIRATORY_TRACT
  Filled 2015-10-08: qty 6

## 2015-10-08 MED ORDER — ALPRAZOLAM 0.5 MG PO TABS
2.0000 mg | ORAL_TABLET | Freq: Three times a day (TID) | ORAL | Status: DC | PRN
  Administered 2015-10-08 – 2015-10-13 (×12): 2 mg via ORAL
  Filled 2015-10-08: qty 2
  Filled 2015-10-08 (×5): qty 4
  Filled 2015-10-08: qty 2
  Filled 2015-10-08: qty 4
  Filled 2015-10-08 (×3): qty 2
  Filled 2015-10-08: qty 4
  Filled 2015-10-08: qty 2

## 2015-10-08 MED ORDER — ONDANSETRON HCL 4 MG/2ML IJ SOLN: 4.0000 mg | Freq: Four times a day (QID) | INTRAMUSCULAR | Status: DC | PRN

## 2015-10-08 MED ORDER — NICOTINE 21 MG/24HR TD PT24
21.0000 mg | MEDICATED_PATCH | Freq: Every day | TRANSDERMAL | Status: DC
  Administered 2015-10-11 – 2015-10-13 (×3): 21 mg via TRANSDERMAL
  Filled 2015-10-08 (×6): qty 1

## 2015-10-08 MED ORDER — SODIUM CHLORIDE 0.9% FLUSH
3.0000 mL | Freq: Two times a day (BID) | INTRAVENOUS | Status: DC
  Administered 2015-10-09 – 2015-10-12 (×4): 3 mL via INTRAVENOUS

## 2015-10-08 MED ORDER — FUROSEMIDE 20 MG PO TABS
20.0000 mg | ORAL_TABLET | Freq: Every day | ORAL | Status: DC
  Administered 2015-10-09 – 2015-10-13 (×5): 20 mg via ORAL
  Filled 2015-10-08 (×5): qty 1

## 2015-10-08 MED ORDER — CLONAZEPAM 1 MG PO TABS
1.0000 mg | ORAL_TABLET | Freq: Two times a day (BID) | ORAL | Status: DC
  Administered 2015-10-08 – 2015-10-13 (×10): 1 mg via ORAL
  Filled 2015-10-08 (×11): qty 1

## 2015-10-08 MED ORDER — GABAPENTIN 400 MG PO CAPS
800.0000 mg | ORAL_CAPSULE | Freq: Three times a day (TID) | ORAL | Status: DC
  Administered 2015-10-08 – 2015-10-13 (×15): 800 mg via ORAL
  Filled 2015-10-08 (×15): qty 2

## 2015-10-08 MED ORDER — ENOXAPARIN SODIUM 40 MG/0.4ML ~~LOC~~ SOLN
40.0000 mg | SUBCUTANEOUS | Status: DC
  Administered 2015-10-08: 40 mg via SUBCUTANEOUS
  Filled 2015-10-08 (×2): qty 0.4

## 2015-10-08 MED ORDER — DIVALPROEX SODIUM 500 MG PO DR TAB
500.0000 mg | DELAYED_RELEASE_TABLET | Freq: Two times a day (BID) | ORAL | Status: DC
  Administered 2015-10-08 – 2015-10-13 (×10): 500 mg via ORAL
  Filled 2015-10-08 (×13): qty 1

## 2015-10-08 MED ORDER — ONDANSETRON HCL 4 MG PO TABS: 4.0000 mg | ORAL_TABLET | Freq: Four times a day (QID) | ORAL | Status: DC | PRN

## 2015-10-08 MED ORDER — TRAZODONE HCL 150 MG PO TABS
150.0000 mg | ORAL_TABLET | Freq: Every day | ORAL | Status: DC
  Administered 2015-10-08 – 2015-10-12 (×5): 150 mg via ORAL
  Filled 2015-10-08 (×5): qty 1

## 2015-10-08 MED ORDER — ASPIRIN 325 MG PO TABS
325.0000 mg | ORAL_TABLET | Freq: Every day | ORAL | Status: DC
Start: 2015-10-08 — End: 2015-10-13
  Administered 2015-10-08 – 2015-10-13 (×6): 325 mg via ORAL
  Filled 2015-10-08 (×6): qty 1

## 2015-10-08 MED ORDER — FUROSEMIDE 10 MG/ML IJ SOLN
40.0000 mg | Freq: Once | INTRAMUSCULAR | Status: AC
  Administered 2015-10-08: 40 mg via INTRAVENOUS
  Filled 2015-10-08: qty 4

## 2015-10-08 MED ORDER — METHYLPREDNISOLONE SODIUM SUCC 125 MG IJ SOLR
80.0000 mg | Freq: Three times a day (TID) | INTRAMUSCULAR | Status: DC
  Administered 2015-10-08 – 2015-10-09 (×3): 80 mg via INTRAVENOUS
  Filled 2015-10-08 (×3): qty 2

## 2015-10-08 MED ORDER — OXYCODONE HCL 5 MG PO TABS
30.0000 mg | ORAL_TABLET | Freq: Once | ORAL | Status: AC
Start: 2015-10-08 — End: 2015-10-08
  Administered 2015-10-08: 30 mg via ORAL
  Filled 2015-10-08: qty 6

## 2015-10-08 MED ORDER — DOXYCYCLINE HYCLATE 100 MG PO TABS
100.0000 mg | ORAL_TABLET | Freq: Two times a day (BID) | ORAL | Status: DC
  Administered 2015-10-08 – 2015-10-12 (×8): 100 mg via ORAL
  Filled 2015-10-08 (×8): qty 1

## 2015-10-08 MED ORDER — INSULIN ASPART 100 UNIT/ML ~~LOC~~ SOLN: 0.0000 [IU] | Freq: Every day | SUBCUTANEOUS | Status: DC

## 2015-10-08 MED ORDER — AMPHETAMINE-DEXTROAMPHETAMINE 20 MG PO TABS: 60.0000 mg | ORAL_TABLET | Freq: Every day | ORAL | Status: DC

## 2015-10-08 MED ORDER — INSULIN ASPART 100 UNIT/ML ~~LOC~~ SOLN
0.0000 [IU] | Freq: Three times a day (TID) | SUBCUTANEOUS | Status: DC
  Administered 2015-10-09: 9 [IU] via SUBCUTANEOUS

## 2015-10-08 MED ORDER — ALBUTEROL SULFATE (2.5 MG/3ML) 0.083% IN NEBU
5.0000 mg | INHALATION_SOLUTION | RESPIRATORY_TRACT | Status: DC | PRN
  Filled 2015-10-08: qty 6

## 2015-10-08 MED ORDER — HYDROXYZINE HCL 25 MG PO TABS
50.0000 mg | ORAL_TABLET | Freq: Three times a day (TID) | ORAL | Status: DC
  Administered 2015-10-08 – 2015-10-13 (×15): 50 mg via ORAL
  Filled 2015-10-08 (×16): qty 2

## 2015-10-08 MED ORDER — DM-GUAIFENESIN ER 30-600 MG PO TB12
1.0000 | ORAL_TABLET | Freq: Two times a day (BID) | ORAL | Status: DC | PRN
  Filled 2015-10-08: qty 1

## 2015-10-08 MED ORDER — PANTOPRAZOLE SODIUM 40 MG PO TBEC
40.0000 mg | DELAYED_RELEASE_TABLET | Freq: Every day | ORAL | Status: DC
  Administered 2015-10-08 – 2015-10-11 (×4): 40 mg via ORAL
  Filled 2015-10-08 (×5): qty 1

## 2015-10-08 MED ORDER — CITALOPRAM HYDROBROMIDE 20 MG PO TABS
20.0000 mg | ORAL_TABLET | Freq: Every day | ORAL | Status: DC
  Administered 2015-10-09 – 2015-10-13 (×5): 20 mg via ORAL
  Filled 2015-10-08 (×6): qty 1

## 2015-10-08 MED ORDER — IBUPROFEN 200 MG PO TABS
400.0000 mg | ORAL_TABLET | Freq: Four times a day (QID) | ORAL | Status: DC | PRN
  Administered 2015-10-10: 600 mg via ORAL
  Filled 2015-10-08 (×2): qty 3

## 2015-10-08 MED ORDER — IPRATROPIUM BROMIDE 0.02 % IN SOLN
0.5000 mg | RESPIRATORY_TRACT | Status: DC
  Administered 2015-10-08 – 2015-10-09 (×2): 0.5 mg via RESPIRATORY_TRACT
  Filled 2015-10-08 (×2): qty 2.5

## 2015-10-08 MED ORDER — IOPAMIDOL (ISOVUE-370) INJECTION 76%
100.0000 mL | Freq: Once | INTRAVENOUS | Status: AC | PRN
  Administered 2015-10-08: 100 mL via INTRAVENOUS

## 2015-10-08 MED ORDER — OXYCODONE HCL 5 MG PO TABS
30.0000 mg | ORAL_TABLET | ORAL | Status: DC | PRN
Start: 2015-10-08 — End: 2015-10-10
  Administered 2015-10-08 – 2015-10-10 (×11): 30 mg via ORAL
  Filled 2015-10-08 (×11): qty 6

## 2015-10-08 NOTE — Progress Notes (Signed)
RT placed patient on CPAP HS auto and . 3L O2 bleed in. Patient tolerating well.

## 2015-10-08 NOTE — Progress Notes (Signed)
EDCM went to speak to patient at bedside, however, patient not in his room

## 2015-10-08 NOTE — H&P (Addendum)
History and Physical    Bryan Gallegos:[id number] DOB: 1975/07/27 DOA: 10/08/2015  Referring MD/NP/PA:   PCP: No PCP Per Patient   Patient coming from:  The patient is coming from home.  At baseline, pt is independent for most of ADL.   Chief Complaint: Shortness of breath, cough, chest pain  HPI: Bryan Gallegos is a 40 y.o. male with medical history significant of hypertension, diabetes mellitus, asthma, GERD, tobacco abuse, TIA, OSA not using CPAP, obesity, bipolar disorder, ADHD, anaphylaxis, who presents with shortness of breath, cough, chest pain.  Patient was recently hospitalized from 7/29-10/01/15 due to airway compromise and enlarged uvular secondary to allergic reaction to lisinopril. He was intubated in that admission. He was discharged in stable condition on prednisone, but the patient states that he is not taking prednisone due to concerning for developing hives.    Patient states that he has been having cough, SOB and chest pain in the past 4 days. His chest pain is located in the upper chest, constant, 8 out of 10 in severity, nonradiating. It is pleuritic, aggravated by deep breath. He coughs up yellow colored sputum, sometimes with streaks of blood. Patient does not have fever or chills. He denies nausea, vomiting, abdominal pain, diarrhea, symptoms of UTI. Pt has a wound infection in right calf area since 7/29 per pt. He completed 7 day course of Bactrim, but still has tenderness and redness.  ED Course: pt was found to have negative troponin, BNP 102.4, WBC 7.8, temperature normal, transient tachycardia, transient tachypnea, oxygen desaturation to 89% on room air, which improved to 95 on 2 L nasal cannula oxygen, electrolytes and renal function okay. ABG with pH of 7.313, PCO2 62.5, PO2 78.3. Chest x-ray showed vascular congestion without infiltration. CTA of chest showed no PE or infiltration, no significant pulmonary edema. Right LE venous Doppler negative for DVT. Pt  is placed on tele bed for obs.  Review of Systems:   General: no fevers, chills, no changes in body weight, has fatigue HEENT: no blurry vision, hearing changes or sore throat Pulm: has dyspnea, coughing, wheezing CV: has chest pain, no palpitations Abd: no nausea, vomiting, abdominal pain, diarrhea, constipation GU: no dysuria, burning on urination, increased urinary frequency, hematuria  Ext: has leg edema Neuro: no unilateral weakness, numbness, or tingling, no vision change or hearing loss Skin: no rash. Has wound in R calf area with surrounding redness and tenderness. MSK: No muscle spasm, no deformity, no limitation of range of movement in spin Heme: No easy bruising.  Travel history: No recent long distant travel.  Allergy:  Allergies  Allergen Reactions  . Atenolol Anaphylaxis  . Lisinopril Anaphylaxis    NO ACE INHIBITORS!!!  . Prednisone Hives  . Tylenol [Acetaminophen] Other (See Comments)    GI Bleed    Past Medical History:  Diagnosis Date  . Adult ADHD   . Anaphylactic reaction   . Anxiety   . Asthma   . Bipolar disorder (HCC)   . Complication of anesthesia    Per patient difficult intubation;  . Diabetes mellitus   . Difficult intubation    Per patient  . Heart valve disorder    s/p echocardiogram  . Hyperlipidemia   . Hypertension   . Morbidly obese (HCC)   . OSA (obstructive sleep apnea)   . Transient cerebral ischemia    Unknown    Past Surgical History:  Procedure Laterality Date  . CARPAL TUNNEL RELEASE    . ESOPHAGOGASTRODUODENOSCOPY  N/A 04/05/2015   Procedure: ESOPHAGOGASTRODUODENOSCOPY (EGD);  Surgeon: Malissa HippoNajeeb U Rehman, MD;  Location: [facility];  Service: Endoscopy;  Laterality: N/A;  . ESOPHAGOGASTRODUODENOSCOPY (EGD) WITH PROPOFOL N/A 05/21/2015   Procedure: ESOPHAGOGASTRODUODENOSCOPY (EGD) WITH PROPOFOL;  Surgeon: Meryl DareMalcolm T Stark, MD;  Location: [facility];  Service: Endoscopy;  Laterality: N/A;  . NOSE SURGERY    . TOOTH  EXTRACTION    . WISDOM TOOTH EXTRACTION      Social History:  reports that he has been smoking Cigarettes.  He has a 11.50 pack-year smoking history. He has never used smokeless tobacco. He reports that he does not drink alcohol or use drugs.  Family History:  Family History  Problem Relation Age of Onset  . CAD Mother     Living  . Diabetes Mellitus II Mother   . Stroke Mother   . Hypertension Mother   . Congestive Heart Failure Mother   . Kidney disease Mother   . Fibromyalgia Mother   . Thyroid disease Mother   . Hyperlipidemia Mother   . Liver disease Mother   . Alcoholism Father 435    Deceased  . Arthritis Maternal Grandmother   . Congestive Heart Failure Maternal Grandmother   . Hypertension Maternal Grandmother   . Lung cancer Maternal Grandfather   . Colon cancer Maternal Aunt   . Stomach cancer Maternal Aunt   . Heart disease Other     Paternal & Maternal  . Crohn's disease Other   . Hypertension Other     Paternal & Maternal  . Hypertension Brother     x3  . Hypertension Sister     #1  . Bipolar disorder Sister     #1  . ADD / ADHD Son     x3  . Bipolar disorder Son     x3  . Asperger's syndrome Son      Prior to Admission medications   Medication Sig Start Date End Date Taking? Authorizing Provider  albuterol (PROVENTIL HFA;VENTOLIN HFA) 108 (90 Base) MCG/ACT inhaler Inhale 2 puffs into the lungs every 6 (six) hours as needed for wheezing or shortness of breath. 09/05/15  Yes Sanjuana KavaAgnes I Nwoko, NP  alprazolam Prudy Feeler(XANAX) 2 MG tablet Take 2 mg by mouth 3 (three) times daily as needed for sleep or anxiety.  09/09/15  Yes Historical Provider, MD  amphetamine-dextroamphetamine (ADDERALL) 30 MG tablet Take 60 mg by mouth daily.    Yes Historical Provider, MD  aspirin 325 MG tablet Take 1 tablet (325 mg total) by mouth daily. For heart health 09/05/15  Yes Sanjuana KavaAgnes I Nwoko, NP  citalopram (CELEXA) 20 MG tablet Take 20 mg by mouth every morning. 10/02/15  Yes Historical  Provider, MD  clonazePAM (KLONOPIN) 1 MG tablet Take 1 mg by mouth 2 (two) times daily. 09/25/15  Yes Historical Provider, MD  dexlansoprazole (DEXILANT) 60 MG capsule Take 1 capsule (60 mg total) by mouth daily. For acid reflux Patient taking differently: Take 60 mg by mouth daily as needed. For acid reflux 09/05/15  Yes Sanjuana KavaAgnes I Nwoko, NP  divalproex (DEPAKOTE) 500 MG DR tablet Take 500 mg by mouth 2 (two) times daily. 10/02/15  Yes Historical Provider, MD  furosemide (LASIX) 20 MG tablet Take 1 tablet (20 mg total) by mouth daily. For swelling 09/05/15  Yes Sanjuana KavaAgnes I Nwoko, NP  gabapentin (NEURONTIN) 400 MG capsule Take 2 capsules (800 mg total) by mouth 3 (three) times daily. For agitation Patient taking differently: Take 800 mg by mouth 3 (  three) times daily. For leg agitation (also) 09/05/15  Yes Sanjuana Kava, NP  hydrOXYzine (ATARAX/VISTARIL) 50 MG tablet Take 50 mg by mouth 3 (three) times daily. 10/03/15  Yes Historical Provider, MD  ibuprofen (ADVIL,MOTRIN) 200 MG tablet Take 400-600 mg by mouth every 6 (six) hours as needed for mild pain or moderate pain.   Yes Historical Provider, MD  metFORMIN (GLUCOPHAGE) 1000 MG tablet Take 1,000 mg by mouth 2 (two) times daily. 09/09/15  Yes Historical Provider, MD  oxycodone (ROXICODONE) 30 MG immediate release tablet Take 30 mg by mouth every 4 (four) hours as needed for pain.   Yes Historical Provider, MD  sulfamethoxazole-trimethoprim (BACTRIM DS,SEPTRA DS) 800-160 MG tablet Take 1 tablet by mouth 2 (two) times daily. 10/04/15 10/11/15 Yes Bethel Born, PA-C  traZODone (DESYREL) 50 MG tablet Take 1 tablet (50 mg total) by mouth at bedtime. For sleep Patient taking differently: Take 150 mg by mouth at bedtime. For sleep 09/05/15  Yes Sanjuana Kava, NP  citalopram (CELEXA) 10 MG tablet Take 1 tablet (10 mg total) by mouth daily. For depression Patient not taking: Reported on 10/04/2015 09/05/15   Sanjuana Kava, NP  hydrOXYzine (VISTARIL) 50 MG capsule Take 1 capsule  (50 mg total) by mouth 3 (three) times daily as needed for anxiety. Patient not taking: Reported on 10/04/2015 09/05/15   Sanjuana Kava, NP  insulin NPH Human (HUMULIN N) 100 UNIT/ML injection Inject 0.01-0.04 mLs (1-4 Units total) into the skin daily before breakfast. For diabetes Patient not taking: Reported on 10/04/2015 09/05/15   Sanjuana Kava, NP  ondansetron (ZOFRAN) 8 MG tablet Take 1 tablet (8 mg total) by mouth 4 (four) times daily -  before meals and at bedtime. Nausea Patient not taking: Reported on 10/08/2015 09/05/15   Sanjuana Kava, NP  predniSONE (DELTASONE) 10 MG tablet Take 1 tablet (10 mg total) by mouth daily with breakfast. Patient not taking: Reported on 10/08/2015 10/01/15   Rahul P Celine Mans, PA-C  sitaGLIPtin-metformin (JANUMET) 50-1000 MG tablet Take 1 tablet by mouth 2 (two) times daily with a meal. For diabetes Patient not taking: Reported on 10/04/2015 09/05/15   Sanjuana Kava, NP    Physical Exam: Vitals:   10/08/15 1709 10/08/15 1942 10/08/15 2021 10/08/15 2148  BP: 135/75 138/76 120/88 97/78  Pulse: 86 90 98 100  Resp: 18 18 12 14   Temp: 98.5 F (36.9 C)   99.7 F (37.6 C)  TempSrc: Oral   Oral  SpO2: 94% 95% 97% 93%  Weight:      Height:       General: Not in acute distress HEENT:       Eyes: PERRL, EOMI, no scleral icterus.       ENT: No discharge from the ears and nose, no pharynx injection, no tonsillar enlargement. His Uvula is minimaly enlarged.       Neck: No JVD, no bruit, no mass felt. Heme: No neck lymph node enlargement. Cardiac: S1/S2, RRR, No murmurs, No gallops or rubs. Pulm: Has coarse breathing sounds and mild  wheezing bilaterally. No rales  or rubs. Abd: Soft, nondistended, nontender, no rebound pain, no organomegaly, BS present. GU: No hematuria Ext: 1+ pitting leg edema bilaterally. 2+DP/PT pulse bilaterally. Musculoskeletal: No joint deformities, No joint redness or warmth, no limitation of ROM in spin. Skin: Has triangular-shaped wound in R calf  area with surrounding redness, tenderness and warmth, scabbed, with some serous sanguinous fluid oozing out, approximately 3 X  6 cm in size. Neuro: Alert, oriented X3, cranial nerves II-XII grossly intact, moves all extremities normally. Psych: Patient is not psychotic, no suicidal or hemocidal ideation.  Labs on Admission: I have personally reviewed following labs and imaging studies  CBC:  Recent Labs Lab 10/04/15 1641 10/08/15 1708  WBC 15.3* 7.8  NEUTROABS 8.6* 5.5  HGB 13.8 13.8  HCT 40.4 40.7  MCV 91.4 91.1  PLT PLATELET CLUMPS NOTED ON SMEAR, UNABLE TO ESTIMATE 194   Basic Metabolic Panel:  Recent Labs Lab 10/04/15 1641 10/08/15 1708  NA 138 140  K 4.6 4.0  CL 103 102  CO2 25 31  GLUCOSE 158* 143*  BUN 7 8  CREATININE 0.71 0.69  CALCIUM 8.6* 8.9   GFR: Estimated Creatinine Clearance: 203 mL/min (by C-G formula based on SCr of 0.8 mg/dL). Liver Function Tests: No results for input(s): AST, ALT, ALKPHOS, BILITOT, PROT, ALBUMIN in the last 168 hours. No results for input(s): LIPASE, AMYLASE in the last 168 hours. No results for input(s): AMMONIA in the last 168 hours. Coagulation Profile: No results for input(s): INR, PROTIME in the last 168 hours. Cardiac Enzymes: No results for input(s): CKTOTAL, CKMB, CKMBINDEX, TROPONINI in the last 168 hours. BNP (last 3 results) No results for input(s): PROBNP in the last 8760 hours. HbA1C: No results for input(s): HGBA1C in the last 72 hours. CBG: No results for input(s): GLUCAP in the last 168 hours. Lipid Profile: No results for input(s): CHOL, HDL, LDLCALC, TRIG, CHOLHDL, LDLDIRECT in the last 72 hours. Thyroid Function Tests: No results for input(s): TSH, T4TOTAL, FREET4, T3FREE, THYROIDAB in the last 72 hours. Anemia Panel: No results for input(s): VITAMINB12, FOLATE, FERRITIN, TIBC, IRON, RETICCTPCT in the last 72 hours. Urine analysis:    Component Value Date/Time   COLORURINE AMBER (A) 09/28/2015 0530    APPEARANCEUR CLOUDY (A) 09/28/2015 0530   LABSPEC 1.028 09/28/2015 0530   PHURINE 5.0 09/28/2015 0530   GLUCOSEU NEGATIVE 09/28/2015 0530   HGBUR NEGATIVE 09/28/2015 0530   BILIRUBINUR SMALL (A) 09/28/2015 0530   KETONESUR NEGATIVE 09/28/2015 0530   PROTEINUR 100 (A) 09/28/2015 0530   UROBILINOGEN 1.0 08/18/2014 2002   NITRITE NEGATIVE 09/28/2015 0530   LEUKOCYTESUR NEGATIVE 09/28/2015 0530   Sepsis Labs: (procalcitonin:4,lacticidven:4) ) Recent Results (from the past 240 hour(s))  Rapid strep screen     Status: None   Collection Time: 10/04/15  4:44 PM  Result Value Ref Range Status   Streptococcus, Group A Screen (Direct) NEGATIVE NEGATIVE Final    Comment: (NOTE) A Rapid Antigen test may result negative if the antigen level in the sample is below the detection level of this test. The FDA has not cleared this test as a stand-alone test therefore the rapid antigen negative result has reflexed to a Group A Strep culture.   Culture, group A strep     Status: None   Collection Time: 10/04/15  4:44 PM  Result Value Ref Range Status   Specimen Description THROAT  Final   Special Requests NONE Reflexed from [id number]  Final   Culture NO GROUP A STREP (S.PYOGENES) ISOLATED  Final   Report Status 10/07/2015 FINAL  Final     Radiological Exams on Admission: Dg Chest 2 View  Result Date: 10/08/2015 CLINICAL DATA:  Shortness of breath EXAM: CHEST  2 VIEW COMPARISON:  10/04/2015 FINDINGS: Heart size is normal. There is no pleural effusion or edema. Pulmonary vascular congestion noted. The lung volumes appear low. No airspace consolidation. IMPRESSION: Pulmonary vascular  congestion. Electronically Signed   By: Signa Kell M.D.   On: 10/08/2015 17:30   Ct Angio Chest Pe W And/or Wo Contrast  Result Date: 10/08/2015 CLINICAL DATA:  Difficulty breathing EXAM: CT ANGIOGRAPHY CHEST WITH CONTRAST TECHNIQUE: Multidetector CT imaging of the chest was performed using the standard  protocol during bolus administration of intravenous contrast. Multiplanar CT image reconstructions and MIPs were obtained to evaluate the vascular anatomy. CONTRAST:  100 mL Isovue 370 COMPARISON:  None. FINDINGS: Mediastinum/Lymph Nodes: The thoracic inlet is within normal limits. Mild lipomatosis of the mediastinum is noted. No significant hilar or mediastinal adenopathy is seen. Cardiovascular: The thoracic aorta and its branches show no aneurysmal dilatation or dissection. Pulmonary artery is well visualized within normal branching pattern. No filling defects to suggest pulmonary emboli are noted. Lungs/Pleura: Mild dependent atelectatic changes are noted. No focal confluent infiltrate is noted. No significant pulmonary edema is noted. Upper abdomen: No acute findings. Musculoskeletal: No chest wall mass or suspicious bone lesions identified. Review of the MIP images confirms the above findings. IMPRESSION: No evidence of pulmonary emboli. No significant CHF. Minimal dependent atelectatic changes are noted. Electronically Signed   By: Alcide Clever M.D.   On: 10/08/2015 19:41     EKG: Independently reviewed. Sinus rhythm, QTC 425, right axis deviation, early R-wave progression.   Assessment/Plan Principal Problem:   Acute respiratory failure with hypoxia (HCC) Active Problems:   OSA (obstructive sleep apnea)   TIA (transient ischemic attack)   Gastroesophageal reflux disease with esophagitis   Asthma exacerbation   Diabetes mellitus type II, controlled (HCC)   Morbid obesity (HCC)   Tobacco use disorder   Essential hypertension   Major depressive disorder, recurrent severe without psychotic features (HCC)   Respiratory failure with hypoxia (HCC)   Wound infection-right leg   Acute respiratory failure with hypoxia (HCC): Etiology is not clear. His Uvula is minimaly enlarged. Does not have airway compromise currently. Differential diagnosis include asthma exacerbation given wheezing vs.  possible bronchitis given coarse breathing sound bilaterally. OSA may have also contributed partially.   -will place on tele bed for obs -Nebulizers: Scheduled Atrovent and prn Xopenex nebs -pt is allergic to prednisone, causing hives and never causing anaphylaxis reaction. He agrees to try Solu-Medrol treatment-->will give Solu-Medrol 80 mg IV q8h  -Mucinex for cough  -Follow up blood culture x2, sputum culture, respiratory virus panel -Start doxycycline 100 mg twice a day which is also for right leg wound infection.  Chest pain: Patient has pleuritic chest pain, likely due to musculoskeletal pain secondary to coughing and possible bronchitis. No PE by CT angiogram of chest. Initial troponin is negative. -Continue aspirin -Troponin 3 -Continue home prn Oxycodone and ibuprofen  Wound infection-right leg:  He completed 7 day course of Bactrim, but still has tenderness and redness. Seems still have infection going on. -start doxycycline which is also for possible bronchitis -Wound Care consult  OSA: not using CPAP at home, but agree to try CPAP today. -CPAP  Hx of TIA: now acute new issues -continue ASA  GERD: -Protonix  DM-II: Last A1c 5.9, well controled. Patient is taking Humulin, Janumet, metformin at home -SSI  Tobacco abuse: -Did counseling about importance of quitting smoking -Nicotine patch  HTN: bp is 138/76. No hx of CHF. 3 echo on 09/03/13 showed EF of 55-60%, but patient has  bilateral 1+ leg edema. Pt is not taking his lasix for more than 2 weeks. -will give one dose of lasix, 40 mg x  1 by IV -resume his home dose of lasix 20 mg daily  Psych issues including bipolar, ADHD, depression: Stable, no suicidal or homicidal ideations. -Continue home medications: Adderall, Celexa, Klonopin, Depakote  DVT ppx: SQ Lovenox Code Status: Full code Family Communication:  Yes, patient's wife and son  at bed side Disposition Plan:  Anticipate discharge back to previous home  environment Consults called:  none Admission status: Obs / tele   Date of Service 10/08/2015    Lorretta Harp [facility] Pager [phone]  If 7PM-7AM, please contact night-coverage [email or web address] Password [email or web address] 10/08/2015, 10:09 PM

## 2015-10-08 NOTE — ED Triage Notes (Signed)
Pt reports difficulty breathing.  States he was intubated on the 29th of July d/t his throat swelling.  Has not felt good since.  Pt reports SOB with exertion.  Pt also presents with a wound in back of his RLE since the 29th also.  Pt does not have his shoes on.  Pt also reports upper cp.  Coughing up yellowish phlegm.

## 2015-10-08 NOTE — Progress Notes (Signed)
*  PRELIMINARY RESULTS* Vascular Ultrasound Right lower extremity venous duplex has been completed.  Preliminary findings: No evidence of DVT or baker's cyst.  Farrel DemarkJill Eunice, RDMS, RVT  10/08/2015, 6:35 PM

## 2015-10-08 NOTE — ED Notes (Signed)
Pt in xray

## 2015-10-08 NOTE — ED Notes (Signed)
Patient transported to CT 
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
## 2015-10-08 NOTE — Progress Notes (Signed)
Patient listed as having 5 ED visits and 7 admissions within the last six months. Patient recently admitted 07/29 to 08/02 was intubated for angioedma at that time due to allergy to lisinopril.  Admitted post being found unresponsive and apneic. Pt admitted to taking Oxycodone, Klonopin, and Xanax (1 of each). Denied any intentions of harming himself.  Patient with history of multi drug ingestion with suicidal intent. Patient listed as having Medicare insurance without a pcp. Patient reports his pcp is [facility] [place]. Admitting MD at bedside.

## 2015-10-09 DIAGNOSIS — J9602 Acute respiratory failure with hypercapnia: Secondary | ICD-10-CM

## 2015-10-09 DIAGNOSIS — F17200 Nicotine dependence, unspecified, uncomplicated: Secondary | ICD-10-CM | POA: Diagnosis not present

## 2015-10-09 DIAGNOSIS — J9601 Acute respiratory failure with hypoxia: Secondary | ICD-10-CM | POA: Diagnosis not present

## 2015-10-09 DIAGNOSIS — L03115 Cellulitis of right lower limb: Secondary | ICD-10-CM

## 2015-10-09 DIAGNOSIS — G4733 Obstructive sleep apnea (adult) (pediatric): Secondary | ICD-10-CM | POA: Diagnosis not present

## 2015-10-09 LAB — BASIC METABOLIC PANEL
Anion gap: 3 — ABNORMAL LOW (ref 5–15)
BUN: 9 mg/dL (ref 6–20)
CHLORIDE: 102 mmol/L (ref 101–111)
CO2: 31 mmol/L (ref 22–32)
Calcium: 8.5 mg/dL — ABNORMAL LOW (ref 8.9–10.3)
Creatinine, Ser: 0.75 mg/dL (ref 0.61–1.24)
GFR calc Af Amer: >60 mL/min (ref >60)
GFR calc non Af Amer: >60 mL/min (ref >60)
GLUCOSE: 224 mg/dL — AB (ref 65–99)
POTASSIUM: 5.1 mmol/L (ref 3.5–5.1)
Sodium: 136 mmol/L (ref 135–145)

## 2015-10-09 LAB — BLOOD GAS, ARTERIAL
ACID-BASE EXCESS: 3.6 mmol/L — AB (ref 0.0–2.0)
BICARBONATE: 28.9 meq/L — AB (ref 20.0–24.0)
DRAWN BY: 441261
O2 Content: 3 L/min
O2 SAT: 90.7 %
Patient temperature: 98.6
TCO2: 25.9 mmol/L (ref 0–100)
pCO2 arterial: 48.6 mmHg — ABNORMAL HIGH (ref 35.0–45.0)
pH, Arterial: 7.391 (ref 7.350–7.450)
pO2, Arterial: 61.6 mmHg — ABNORMAL LOW (ref 80.0–100.0)

## 2015-10-09 LAB — RESPIRATORY PANEL BY PCR
Adenovirus: NOT DETECTED
Bordetella pertussis: NOT DETECTED
CORONAVIRUS HKU1-RVPPCR: NOT DETECTED
CORONAVIRUS OC43-RVPPCR: NOT DETECTED
Chlamydophila pneumoniae: NOT DETECTED
Coronavirus 229E: NOT DETECTED
Coronavirus NL63: NOT DETECTED
INFLUENZA A H1 2009-RVPPR: NOT DETECTED
INFLUENZA A H3-RVPPCR: NOT DETECTED
Influenza A H1: NOT DETECTED
Influenza A: NOT DETECTED
Influenza B: NOT DETECTED
METAPNEUMOVIRUS-RVPPCR: NOT DETECTED
MYCOPLASMA PNEUMONIAE-RVPPCR: NOT DETECTED
PARAINFLUENZA VIRUS 1-RVPPCR: NOT DETECTED
PARAINFLUENZA VIRUS 2-RVPPCR: NOT DETECTED
Parainfluenza Virus 3: NOT DETECTED
Parainfluenza Virus 4: NOT DETECTED
RESPIRATORY SYNCYTIAL VIRUS-RVPPCR: NOT DETECTED
Rhinovirus / Enterovirus: NOT DETECTED

## 2015-10-09 LAB — GLUCOSE, CAPILLARY
GLUCOSE-CAPILLARY: 375 mg/dL — AB (ref 65–99)
Glucose-Capillary: 349 mg/dL — ABNORMAL HIGH (ref 65–99)

## 2015-10-09 LAB — CBC
HEMATOCRIT: 41.8 % (ref 39.0–52.0)
Hemoglobin: 13.8 g/dL (ref 13.0–17.0)
MCH: 30.6 pg (ref 26.0–34.0)
MCHC: 33 g/dL (ref 30.0–36.0)
MCV: 92.7 fL (ref 78.0–100.0)
Platelets: 150 10*3/uL (ref 150–400)
RBC: 4.51 MIL/uL (ref 4.22–5.81)
RDW: 13.2 % (ref 11.5–15.5)
WBC: 9.1 10*3/uL (ref 4.0–10.5)

## 2015-10-09 LAB — MRSA PCR SCREENING: MRSA by PCR: NEGATIVE

## 2015-10-09 LAB — TROPONIN I
Troponin I: <0.03 ng/mL (ref <0.03)
Troponin I: <0.03 ng/mL (ref <0.03)

## 2015-10-09 MED ORDER — AMPHETAMINE-DEXTROAMPHETAMINE 10 MG PO TABS
30.0000 mg | ORAL_TABLET | Freq: Two times a day (BID) | ORAL | Status: DC
  Administered 2015-10-09 – 2015-10-13 (×10): 30 mg via ORAL
  Filled 2015-10-09 (×10): qty 3

## 2015-10-09 MED ORDER — ENOXAPARIN SODIUM 100 MG/ML ~~LOC~~ SOLN
0.5000 mg/kg | SUBCUTANEOUS | Status: DC
  Administered 2015-10-09 – 2015-10-12 (×4): 85 mg via SUBCUTANEOUS
  Filled 2015-10-09 (×4): qty 1

## 2015-10-09 MED ORDER — IPRATROPIUM-ALBUTEROL 0.5-2.5 (3) MG/3ML IN SOLN
3.0000 mL | Freq: Four times a day (QID) | RESPIRATORY_TRACT | Status: DC
  Administered 2015-10-09 – 2015-10-10 (×5): 3 mL via RESPIRATORY_TRACT
  Filled 2015-10-09 (×5): qty 3

## 2015-10-09 MED ORDER — INSULIN ASPART 100 UNIT/ML ~~LOC~~ SOLN
0.0000 [IU] | Freq: Three times a day (TID) | SUBCUTANEOUS | Status: DC
  Administered 2015-10-10 (×2): 20 [IU] via SUBCUTANEOUS
  Administered 2015-10-10 – 2015-10-11 (×2): 15 [IU] via SUBCUTANEOUS
  Administered 2015-10-11 – 2015-10-12 (×3): 20 [IU] via SUBCUTANEOUS
  Administered 2015-10-12: 11 [IU] via SUBCUTANEOUS
  Administered 2015-10-12: 3 [IU] via SUBCUTANEOUS
  Administered 2015-10-13: 20 [IU] via SUBCUTANEOUS
  Administered 2015-10-13 (×2): 15 [IU] via SUBCUTANEOUS

## 2015-10-09 MED ORDER — METHYLPREDNISOLONE SODIUM SUCC 125 MG IJ SOLR
60.0000 mg | Freq: Two times a day (BID) | INTRAMUSCULAR | Status: DC
  Administered 2015-10-10 – 2015-10-11 (×3): 60 mg via INTRAVENOUS
  Filled 2015-10-09 (×3): qty 2

## 2015-10-09 MED ORDER — FUROSEMIDE 10 MG/ML IJ SOLN
INTRAMUSCULAR | Status: AC
  Administered 2015-10-09: 04:00:00
  Filled 2015-10-09: qty 4

## 2015-10-09 MED ORDER — INSULIN ASPART 100 UNIT/ML ~~LOC~~ SOLN
0.0000 [IU] | Freq: Every day | SUBCUTANEOUS | Status: DC
  Administered 2015-10-09: 4 [IU] via SUBCUTANEOUS
  Administered 2015-10-10 – 2015-10-11 (×2): 5 [IU] via SUBCUTANEOUS
  Administered 2015-10-12: 3 [IU] via SUBCUTANEOUS

## 2015-10-09 MED ORDER — IPRATROPIUM-ALBUTEROL 0.5-2.5 (3) MG/3ML IN SOLN
3.0000 mL | RESPIRATORY_TRACT | Status: DC
  Administered 2015-10-09 (×2): 3 mL via RESPIRATORY_TRACT
  Filled 2015-10-09 (×2): qty 3

## 2015-10-09 MED ORDER — MUPIROCIN 2 % EX OINT
TOPICAL_OINTMENT | Freq: Two times a day (BID) | CUTANEOUS | Status: DC
  Administered 2015-10-09 – 2015-10-13 (×8): via NASAL
  Filled 2015-10-09 (×2): qty 22

## 2015-10-09 MED ORDER — INSULIN GLARGINE 100 UNIT/ML ~~LOC~~ SOLN
10.0000 [IU] | Freq: Every day | SUBCUTANEOUS | Status: DC
  Administered 2015-10-09: 10 [IU] via SUBCUTANEOUS
  Filled 2015-10-09 (×2): qty 0.1

## 2015-10-09 MED ORDER — INSULIN ASPART 100 UNIT/ML ~~LOC~~ SOLN
3.0000 [IU] | Freq: Three times a day (TID) | SUBCUTANEOUS | Status: DC
  Administered 2015-10-10 – 2015-10-13 (×12): 3 [IU] via SUBCUTANEOUS

## 2015-10-09 NOTE — Progress Notes (Signed)
Patient upset about diet orders. Patient admitted from ED around 2100 last night. Patient has been NPO since admission. Patient arrived to unit eating bag of Doritos. Wife has brought [employer] to the room. Patient instructed about diet orders and told he is nothing by mouth until the doctor changes order. Patient's wife at this time notified RN that patient wants a Sprite, this RN told patient that he cannot have Sprite. Patient then begins to take off telemetry box and says that " I will just go home and die than to be hungry". This RN tells patient that he only has two hours until new physician comes in and may be willing to change diet order. This RN educated patient on importance of staying in hospital until medically stable to go home. Patient agrees to comply with NPO order and agrees on waiting until doctor sees him in the morning.
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
## 2015-10-09 NOTE — Progress Notes (Signed)
Pt states that he will self administer CPAP when ready for bed.  RT to monitor and assess as needed.  
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
## 2015-10-09 NOTE — Progress Notes (Signed)
Patient oxygen saturation was 82 percent on room air, while ambulating.

## 2015-10-10 DIAGNOSIS — E785 Hyperlipidemia, unspecified: Secondary | ICD-10-CM | POA: Diagnosis present

## 2015-10-10 DIAGNOSIS — Z720 Tobacco use: Secondary | ICD-10-CM

## 2015-10-10 DIAGNOSIS — K210 Gastro-esophageal reflux disease with esophagitis: Secondary | ICD-10-CM | POA: Diagnosis present

## 2015-10-10 DIAGNOSIS — J9601 Acute respiratory failure with hypoxia: Secondary | ICD-10-CM | POA: Diagnosis present

## 2015-10-10 DIAGNOSIS — F319 Bipolar disorder, unspecified: Secondary | ICD-10-CM | POA: Diagnosis present

## 2015-10-10 DIAGNOSIS — E872 Acidosis: Secondary | ICD-10-CM | POA: Diagnosis present

## 2015-10-10 DIAGNOSIS — I10 Essential (primary) hypertension: Secondary | ICD-10-CM | POA: Diagnosis present

## 2015-10-10 DIAGNOSIS — E119 Type 2 diabetes mellitus without complications: Secondary | ICD-10-CM | POA: Diagnosis not present

## 2015-10-10 DIAGNOSIS — Z8673 Personal history of transient ischemic attack (TIA), and cerebral infarction without residual deficits: Secondary | ICD-10-CM | POA: Diagnosis not present

## 2015-10-10 DIAGNOSIS — F909 Attention-deficit hyperactivity disorder, unspecified type: Secondary | ICD-10-CM | POA: Diagnosis present

## 2015-10-10 DIAGNOSIS — Z833 Family history of diabetes mellitus: Secondary | ICD-10-CM | POA: Diagnosis not present

## 2015-10-10 DIAGNOSIS — Z7982 Long term (current) use of aspirin: Secondary | ICD-10-CM | POA: Diagnosis not present

## 2015-10-10 DIAGNOSIS — R490 Dysphonia: Secondary | ICD-10-CM | POA: Diagnosis not present

## 2015-10-10 DIAGNOSIS — E1165 Type 2 diabetes mellitus with hyperglycemia: Secondary | ICD-10-CM | POA: Diagnosis present

## 2015-10-10 DIAGNOSIS — J383 Other diseases of vocal cords: Secondary | ICD-10-CM | POA: Diagnosis present

## 2015-10-10 DIAGNOSIS — R05 Cough: Secondary | ICD-10-CM | POA: Diagnosis not present

## 2015-10-10 DIAGNOSIS — F602 Antisocial personality disorder: Secondary | ICD-10-CM | POA: Diagnosis present

## 2015-10-10 DIAGNOSIS — F17210 Nicotine dependence, cigarettes, uncomplicated: Secondary | ICD-10-CM | POA: Diagnosis present

## 2015-10-10 DIAGNOSIS — T783XXD Angioneurotic edema, subsequent encounter: Secondary | ICD-10-CM | POA: Diagnosis not present

## 2015-10-10 DIAGNOSIS — G4733 Obstructive sleep apnea (adult) (pediatric): Secondary | ICD-10-CM

## 2015-10-10 DIAGNOSIS — T464X5D Adverse effect of angiotensin-converting-enzyme inhibitors, subsequent encounter: Secondary | ICD-10-CM

## 2015-10-10 DIAGNOSIS — Z7952 Long term (current) use of systemic steroids: Secondary | ICD-10-CM | POA: Diagnosis not present

## 2015-10-10 DIAGNOSIS — Z8249 Family history of ischemic heart disease and other diseases of the circulatory system: Secondary | ICD-10-CM | POA: Diagnosis not present

## 2015-10-10 DIAGNOSIS — F419 Anxiety disorder, unspecified: Secondary | ICD-10-CM | POA: Diagnosis present

## 2015-10-10 DIAGNOSIS — Z6841 Body Mass Index (BMI) 40.0 and over, adult: Secondary | ICD-10-CM | POA: Diagnosis not present

## 2015-10-10 DIAGNOSIS — J45901 Unspecified asthma with (acute) exacerbation: Secondary | ICD-10-CM | POA: Diagnosis not present

## 2015-10-10 DIAGNOSIS — Z794 Long term (current) use of insulin: Secondary | ICD-10-CM | POA: Diagnosis not present

## 2015-10-10 DIAGNOSIS — J9602 Acute respiratory failure with hypercapnia: Secondary | ICD-10-CM | POA: Diagnosis present

## 2015-10-10 DIAGNOSIS — L03115 Cellulitis of right lower limb: Secondary | ICD-10-CM | POA: Diagnosis present

## 2015-10-10 DIAGNOSIS — J449 Chronic obstructive pulmonary disease, unspecified: Secondary | ICD-10-CM | POA: Diagnosis present

## 2015-10-10 DIAGNOSIS — J387 Other diseases of larynx: Secondary | ICD-10-CM

## 2015-10-10 LAB — BLOOD GAS, ARTERIAL
ACID-BASE EXCESS: 2.1 mmol/L — AB (ref 0.0–2.0)
BICARBONATE: 26.4 meq/L — AB (ref 20.0–24.0)
Drawn by: 441261
FIO2: 0.21
O2 SAT: 95.6 %
PO2 ART: 80.6 mmHg (ref 80.0–100.0)
Patient temperature: 98.7
TCO2: 23.6 mmol/L (ref 0–100)
pCO2 arterial: 42.1 mmHg (ref 35.0–45.0)
pH, Arterial: 7.414 (ref 7.350–7.450)

## 2015-10-10 LAB — GLUCOSE, CAPILLARY
GLUCOSE-CAPILLARY: 315 mg/dL — AB (ref 65–99)
GLUCOSE-CAPILLARY: 354 mg/dL — AB (ref 65–99)
Glucose-Capillary: 378 mg/dL — ABNORMAL HIGH (ref 65–99)
Glucose-Capillary: 406 mg/dL — ABNORMAL HIGH (ref 65–99)

## 2015-10-10 MED ORDER — OXYCODONE HCL 5 MG PO TABS
30.0000 mg | ORAL_TABLET | Freq: Four times a day (QID) | ORAL | Status: DC | PRN
  Administered 2015-10-10: 30 mg via ORAL
  Filled 2015-10-10: qty 6

## 2015-10-10 MED ORDER — SALINE SPRAY 0.65 % NA SOLN
2.0000 | Freq: Two times a day (BID) | NASAL | Status: DC
  Administered 2015-10-10 – 2015-10-13 (×3): 2 via NASAL
  Filled 2015-10-10 (×2): qty 44

## 2015-10-10 MED ORDER — FLUTICASONE PROPIONATE 50 MCG/ACT NA SUSP
1.0000 | Freq: Every day | NASAL | Status: DC
  Administered 2015-10-10 – 2015-10-13 (×4): 1 via NASAL
  Filled 2015-10-10 (×2): qty 16

## 2015-10-10 MED ORDER — OXYCODONE HCL 5 MG PO TABS
30.0000 mg | ORAL_TABLET | ORAL | Status: DC | PRN
  Administered 2015-10-11 – 2015-10-13 (×15): 30 mg via ORAL
  Filled 2015-10-10 (×15): qty 6

## 2015-10-10 MED ORDER — IPRATROPIUM-ALBUTEROL 0.5-2.5 (3) MG/3ML IN SOLN
3.0000 mL | Freq: Three times a day (TID) | RESPIRATORY_TRACT | Status: DC
  Administered 2015-10-10 – 2015-10-12 (×7): 3 mL via RESPIRATORY_TRACT
  Filled 2015-10-10 (×8): qty 3

## 2015-10-10 MED ORDER — INSULIN GLARGINE 100 UNIT/ML ~~LOC~~ SOLN
25.0000 [IU] | Freq: Every day | SUBCUTANEOUS | Status: DC
  Administered 2015-10-10: 25 [IU] via SUBCUTANEOUS
  Filled 2015-10-10 (×3): qty 0.25

## 2015-10-10 MED ORDER — INSULIN ASPART 100 UNIT/ML ~~LOC~~ SOLN
8.0000 [IU] | Freq: Once | SUBCUTANEOUS | Status: AC
  Administered 2015-10-10: 8 [IU] via SUBCUTANEOUS

## 2015-10-10 NOTE — Progress Notes (Signed)
RN received call from Okey Regalarol (infection prevention) to D/C droplet precaution since resp. panel is negative. Will continue to monitor.
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
## 2015-10-10 NOTE — Progress Notes (Signed)
Inpatient Diabetes Program Recommendations  AACE/ADA: New Consensus Statement on Inpatient Glycemic Control (2015)  Target Ranges:  Prepandial:   less than 140 mg/dL      Peak postprandial:   less than 180 mg/dL (1-2 hours)      Critically ill patients:  140 - 180 mg/dL   Lab Results  Component Value Date   GLUCAP 378 (H) 10/10/2015   HGBA1C 5.9 (H) 05/17/2015   Results for [name], Bryan Gallegos (MRN [id number]) as of 10/10/2015 17:42  Ref. Range 10/09/2015 16:28 10/09/2015 22:04 10/10/2015 07:24 10/10/2015 11:33 10/10/2015 17:21  Glucose-Capillary Latest Ref Range: 65 - 99 mg/dL 782375 (H) 956349 (H) 213315 (H) 354 (H) 378 (H)   Review of Glycemic Control  Diabetes history: DM2 Outpatient Diabetes medications: None. Has previously been on Janumet 50/1000 mg bid, metformin 1000 mg bid, NPH 1-4 units ac Breakfast (never fillled) Current orders for Inpatient glycemic control: Lantus 25 units QHS, Novolog resistant tidwc and hs + 3 units tidwc  Inpatient Diabetes Program Recommendations:    To start Lantus 25 units tonight. Increase Novolog to 6 units tidwc Change diet to CHO mod med 2 gram sodium.  Will continue to follow. Thank you. Ailene Ardshonda Sabre Leonetti, RD, LDN, CDE Inpatient Diabetes Coordinator [phone]

## 2015-10-10 NOTE — Progress Notes (Signed)
This RN was notified by security that patient was in main lobby [address]. Patient was brought back to unit by security and NeurosurgeonAdministrative Coordinator. Patient was educated on the importance of staying on unit where nurses are able to monitor him due to patient's safety. Patient verbalizes understanding and agrees to comply with hospital policy.

## 2015-10-10 NOTE — Progress Notes (Signed)
SATURATION QUALIFICATIONS: (This note is used to comply with regulatory documentation for home oxygen)  Patient Saturations on Room Air at Rest = 98%  Patient Saturations on Room Air while Ambulating = 88%  Patient Saturations on 3 Liters of oxygen while Ambulating = 94%  Please briefly explain why patient needs home oxygen: oxygen desaturation during ambulation

## 2015-10-10 NOTE — Progress Notes (Signed)
RT came to draw ABG on patient and patient was walking off unit. Security was called, but cancelled due to patient returning from using waiting room bathroom. After returning to room, patient had the strong smell of fresh cigarette smoke. RT drew ABG per order and spoke with secretary of unit about the smell of cigarettes on patient.
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
## 2015-10-10 NOTE — Progress Notes (Signed)
RN called [facility] to [facility]. [facility] awaiting [facility] link [facility]

## 2015-10-10 NOTE — Care Management Note (Signed)
Case Management Note  Patient Details  Name: Bryan Gallegos MRN: [id number] Date of Birth: 10/14/1975  Subjective/Objective:       40 yo admitted with Acute respiratory failure.             Action/Plan: This CM was contacted by MD for assistance in obtaining home 02 and home bipap/cpap for pt. Pt has not had a recent sleep study. This CM has contacted [employer] rep Mena Goes([place] [phone]) for assistance with DME. This CM has faxed demographics, desaturation screen and H&P for pt to [employer] (fax: [phone]) [employer] rep has made recommendation for a Trilogy Vent, which is similar to a bipap. This was discussed with MD.  Will await CCM/Pulm to consult and make recommendations for home DME.  Handoff given to weekend CM to finish set up of DME. CM will continue to follow.   Expected Discharge Date:   (unknown)               Expected Discharge Plan:  Home/Self Care  In-House Referral:     Discharge planning Services  CM Consult  Post Acute Care Choice:    Choice offered to:     DME Arranged:    DME Agency:   ([employer])  HH Arranged:    HH Agency:     Status of Service:  In process, will continue to follow  If discussed at Long Length of Stay Meetings, dates discussed:    Additional CommentsBartholome Bill:  Britt Petroni H, RN 10/10/2015, 3:52 PM  [phone]

## 2015-10-10 NOTE — Progress Notes (Signed)
[facility] called to [facility] Bed [facility] [address] [facility]. [facility] stated it would probably be a couple of hours before they picked up patient.

## 2015-10-10 NOTE — Progress Notes (Signed)
Report given to [facility]. [facility] will arrive in approximately 10 minutes to [facility].

## 2015-10-11 DIAGNOSIS — F17200 Nicotine dependence, unspecified, uncomplicated: Secondary | ICD-10-CM

## 2015-10-11 DIAGNOSIS — R490 Dysphonia: Secondary | ICD-10-CM

## 2015-10-11 DIAGNOSIS — K210 Gastro-esophageal reflux disease with esophagitis: Secondary | ICD-10-CM

## 2015-10-11 DIAGNOSIS — J9601 Acute respiratory failure with hypoxia: Secondary | ICD-10-CM

## 2015-10-11 LAB — GLUCOSE, CAPILLARY
Glucose-Capillary: 395 mg/dL — ABNORMAL HIGH (ref 65–99)
Glucose-Capillary: 462 mg/dL — ABNORMAL HIGH (ref 65–99)
Glucose-Capillary: 340 mg/dL — ABNORMAL HIGH (ref 65–99)
Glucose-Capillary: 430 mg/dL — ABNORMAL HIGH (ref 65–99)
Glucose-Capillary: 314 mg/dL — ABNORMAL HIGH (ref 65–99)

## 2015-10-11 MED ORDER — IPRATROPIUM-ALBUTEROL 20-100 MCG/ACT IN AERS: 1.0000 | INHALATION_SPRAY | Freq: Four times a day (QID) | RESPIRATORY_TRACT | 0 refills | Status: DC

## 2015-10-11 MED ORDER — METHYLPREDNISOLONE ACETATE 40 MG/ML IJ SUSP
40.0000 mg | Freq: Once | INTRAMUSCULAR | Status: AC
  Administered 2015-10-11: 40 mg via INTRAMUSCULAR
  Filled 2015-10-11: qty 1

## 2015-10-11 MED ORDER — NICOTINE 21 MG/24HR TD PT24: 21.0000 mg | MEDICATED_PATCH | Freq: Every day | TRANSDERMAL | 0 refills | Status: DC

## 2015-10-11 MED ORDER — INSULIN ASPART 100 UNIT/ML ~~LOC~~ SOLN
10.0000 [IU] | Freq: Once | SUBCUTANEOUS | Status: AC
  Administered 2015-10-11: 10 [IU] via SUBCUTANEOUS

## 2015-10-11 MED ORDER — PANTOPRAZOLE SODIUM 40 MG PO TBEC
40.0000 mg | DELAYED_RELEASE_TABLET | Freq: Two times a day (BID) | ORAL | Status: DC
  Administered 2015-10-11 – 2015-10-13 (×4): 40 mg via ORAL
  Filled 2015-10-11 (×4): qty 1

## 2015-10-11 MED ORDER — OPTICHAMBER DIAMOND MISC
1.0000 | Freq: Once | Status: AC
  Administered 2015-10-12: 1
  Filled 2015-10-11: qty 1

## 2015-10-11 MED ORDER — FAMOTIDINE 20 MG PO TABS
20.0000 mg | ORAL_TABLET | Freq: Every day | ORAL | Status: DC
  Administered 2015-10-11 – 2015-10-12 (×2): 20 mg via ORAL
  Filled 2015-10-11 (×2): qty 1

## 2015-10-11 MED ORDER — ALBUTEROL SULFATE HFA 108 (90 BASE) MCG/ACT IN AERS: 2.0000 | INHALATION_SPRAY | RESPIRATORY_TRACT | 0 refills | Status: DC | PRN

## 2015-10-11 MED ORDER — FLUTICASONE PROPIONATE 50 MCG/ACT NA SUSP: 1.0000 | Freq: Every day | NASAL | 0 refills | Status: DC

## 2015-10-11 MED ORDER — DM-GUAIFENESIN ER 30-600 MG PO TB12: 1.0000 | ORAL_TABLET | Freq: Two times a day (BID) | ORAL | 0 refills | Status: DC | PRN

## 2015-10-11 MED ORDER — CITALOPRAM HYDROBROMIDE 20 MG PO TABS: 20.0000 mg | ORAL_TABLET | Freq: Every day | ORAL | 0 refills | Status: DC

## 2015-10-11 MED ORDER — INSULIN GLARGINE 100 UNIT/ML ~~LOC~~ SOLN
30.0000 [IU] | Freq: Every day | SUBCUTANEOUS | Status: DC
  Administered 2015-10-11 – 2015-10-12 (×2): 30 [IU] via SUBCUTANEOUS
  Filled 2015-10-11 (×3): qty 0.3

## 2015-10-11 MED ORDER — DM-GUAIFENESIN ER 30-600 MG PO TB12
2.0000 | ORAL_TABLET | Freq: Two times a day (BID) | ORAL | Status: DC
  Administered 2015-10-11 – 2015-10-13 (×5): 2 via ORAL
  Filled 2015-10-11 (×6): qty 2

## 2015-10-11 MED ORDER — DOXYCYCLINE HYCLATE 100 MG PO TABS: 100.0000 mg | ORAL_TABLET | Freq: Two times a day (BID) | ORAL | 0 refills | Status: DC

## 2015-10-11 MED ORDER — GABAPENTIN 400 MG PO CAPS: 800.0000 mg | ORAL_CAPSULE | Freq: Three times a day (TID) | ORAL | 0 refills | Status: DC

## 2015-10-11 MED ORDER — RESOURCE THICKENUP CLEAR PO POWD
Freq: Once | ORAL | Status: AC
  Administered 2015-10-11: 13:00:00 via ORAL
  Filled 2015-10-11: qty 125

## 2015-10-11 MED ORDER — SALINE SPRAY 0.65 % NA SOLN: 2.0000 | Freq: Two times a day (BID) | NASAL | 0 refills | Status: DC

## 2015-10-11 NOTE — Progress Notes (Signed)
Pt. [facility] to 6N [facility]s, set up with L/FFM per home regimen, ,02 set up/added to circuit @ 3 lpm, humidity filled, able to place on by himself, made aware to notify if needed, wife in bed in room, pt. sleeps in chair, RT to monitor.

## 2015-10-11 NOTE — Evaluation (Signed)
Clinical/Bedside Swallow Evaluation Patient Details  Name: Bryan Gallegos MRN: [id number] Date of Birth: November 06, 1975  Today's Date: 10/11/2015 Time: SLP Start Time (ACUTE ONLY): 1216 SLP Stop Time (ACUTE ONLY): 1240 SLP Time Calculation (min) (ACUTE ONLY): 24 min  Past Medical History:  Past Medical History:  Diagnosis Date  . Adult ADHD   . Anaphylactic reaction   . Anxiety   . Asthma   . Bipolar disorder (HCC)   . Complication of anesthesia    Per patient difficult intubation;  . Diabetes mellitus   . Difficult intubation    Per patient  . Heart valve disorder    s/p echocardiogram  . Hyperlipidemia   . Hypertension   . Morbidly obese (HCC)   . OSA (obstructive sleep apnea)   . Transient cerebral ischemia    Unknown   Past Surgical History:  Past Surgical History:  Procedure Laterality Date  . CARPAL TUNNEL RELEASE    . ESOPHAGOGASTRODUODENOSCOPY N/A 04/05/2015   Procedure: ESOPHAGOGASTRODUODENOSCOPY (EGD);  Surgeon: Malissa Hippo, MD;  Location: [facility];  Service: Endoscopy;  Laterality: N/A;  . ESOPHAGOGASTRODUODENOSCOPY (EGD) WITH PROPOFOL N/A 05/21/2015   Procedure: ESOPHAGOGASTRODUODENOSCOPY (EGD) WITH PROPOFOL;  Surgeon: Meryl Dare, MD;  Location: [facility] ENDOSCOPY;  Service: Endoscopy;  Laterality: N/A;  . NOSE SURGERY    . TOOTH EXTRACTION    . WISDOM TOOTH EXTRACTION     HPI:  40 y.o. male w/ known h/o severe OSA. His most recent hx is significant for a hospital admit on 7/30 for suicidal intent and multidrug ingestion. While in ER developed acute respiratory distress, abnormal phonation and concern for evolving angioedema (felt due to Ace-I). He was emergently intubated via fiberoptic bronchoscopy. He was subsequently extubated 8/1, sent home on 8/2 w/ predtaper and instructed never to take Ace-Is again. Since his discharge he has had persistent cough (especially any time he eats or drinks), loud audible wheeze w/ activity and when he is in the supine  position (per his significant other), significant shortness of breath and vocal hoarseness.  He eventually presented to the ED on 8/9 w/ cc: chest pain, cough and shortness of breath. Also incidentally noted to have what looked like LE cellulitis. His room air sats were in 80s and PCO2 hwas 78 so he was admitted for further evaluation and treated w/ working dx of: possible bronchospasm as the etiology of his respiratory failure.  ENT eval 8/12: "Fiberoptic exam reveals some bluish material in his nose streaking down the floor. It looks like a dye. Patient states he coughed on a-colored medication that may be what it is. The nasopharynx is clear. The base of tongue is normal. Epiglottis is normal. His vocal cords are somewhat thickened and they do not abduct fully. He doesn't have any stridor. The vocal cords do appear to move normally. They seem to be tight. There is a definite fairly significant amount of interarytenoid and arytenoid edema. This is not watery angioedema like appearance just thickening more like chronic reflux. Piriforms look to be clear." ENT dx of  "Vocal cord dysfunction/laryngeal pharyngeal reflux/dysphagia-the appearance of his vocal cords do not explain why he is not able to swallow liquids. The vocal cords are actually more closed than they should be and look to adduct normally. They are narrowed with regard to the abduction so I can see why he might have a little bit of difficulty with increased activity. They do fluctuate as I was watching his vocal cords meaning they do open  up more than when he is concentrating on the exam. This is likely vocal cord spasm. I think that a increased more aggressive reflux regimen is more than appropriate with twice a day proton pump inhibitor and 300 mg of Zantac at night. A benzodiazepine may also be beneficial for relaxation if possible." SLP eval ordered.    Assessment / Plan / Recommendation Clinical Impression  Pt presents with dysphagia with s/s  of aspiration with consumption of thin and nectar-thick liquids; cough is eliminated with use of chin tuck with nectars.  Solids appear to be tolerated without difficulty.  There are no oral deficits.  Per ENT eval, pt with vocal cord trauma/ laryngeal pharyngeal reflux with adequate vocal fold adduction, but thickening of cords, decreased abduction, and edema in intraarytenoid space. Recommend proceeding with instrumental swalow study - this can be scheduled 8/13.  D/W pt, wife; reviewed precautions; pt able to follow-through independently.  D/W RN.       Aspiration Risk  Mild aspiration risk    Diet Recommendation   regular solids; nectar thick liquids with chin tuck  Medication Administration: Whole meds with puree    Other  Recommendations Oral Care Recommendations: Oral care BID   Follow up Recommendations   (tba)    Frequency and Duration            Prognosis Prognosis for Safe Diet Advancement: Good      Swallow Study   General HPI: 40 y.o. male w/ known h/o severe OSA. His most recent hx is significant for a hospital admit on 7/30 for suicidal intent and multidrug ingestion. While in ER developed acute respiratory distress, abnormal phonation and concern for evolving angioedema (felt due to Ace-I). He was emergently intubated via fiberoptic bronchoscopy. He was subsequently extubated 8/1, sent home on 8/2 w/ predtaper and instructed never to take Ace-Is again. Since his discharge he has had persistent cough (especially any time he eats or drinks), loud audible wheeze w/ activity and when he is in the supine position (per his significant other), significant shortness of breath and vocal hoarseness.  He eventually presented to the ED on 8/9 w/ cc: chest pain, cough and shortness of breath. Also incidentally noted to have what looked like LE cellulitis. His room air sats were in 80s and PCO2 hwas 78 so he was admitted for further evaluation and treated w/ working dx of: possible  bronchospasm as the etiology of his respiratory failure.  ENT eval 8/12: "Fiberoptic exam reveals some bluish material in his nose streaking down the floor. It looks like a dye. Patient states he coughed on a-colored medication that may be what it is. The nasopharynx is clear. The base of tongue is normal. Epiglottis is normal. His vocal cords are somewhat thickened and they do not abduct fully. He doesn't have any stridor. The vocal cords do appear to move normally. They seem to be tight. There is a definite fairly significant amount of interarytenoid and arytenoid edema. This is not watery angioedema like appearance just thickening more like chronic reflux. Piriforms look to be clear." ENT dx of  "Vocal cord dysfunction/laryngeal pharyngeal reflux/dysphagia-the appearance of his vocal cords do not explain why he is not able to swallow liquids. The vocal cords are actually more closed than they should be and look to adduct normally. They are narrowed with regard to the abduction so I can see why he might have a little bit of difficulty with increased activity. They do fluctuate as I was watching  his vocal cords meaning they do open up more than when he is concentrating on the exam. This is likely vocal cord spasm. I think that a increased more aggressive reflux regimen is more than appropriate with twice a day proton pump inhibitor and 300 mg of Zantac at night. A benzodiazepine may also be beneficial for relaxation if possible." SLP eval ordered.  Type of Study: Bedside Swallow Evaluation Previous Swallow Assessment:  (10/01/15) Diet Prior to this Study: Regular;Thin liquids Temperature Spikes Noted: No Respiratory Status: Room air History of Recent Intubation: Yes Length of Intubations (days): 2 days Date extubated: 09/30/15 Behavior/Cognition: Alert;Cooperative;Pleasant mood Oral Cavity Assessment: Within Functional Limits Oral Care Completed by SLP: No Oral Cavity - Dentition: Adequate natural  dentition Vision: Functional for self-feeding Self-Feeding Abilities: Able to feed self Patient Positioning: Upright in bed Baseline Vocal Quality: Hoarse Volitional Cough: Weak Volitional Swallow: Able to elicit    Oral/Motor/Sensory Function Overall Oral Motor/Sensory Function: Within functional limits   Ice Chips Ice chips: Not tested   Thin Liquid Thin Liquid: Impaired Presentation: Cup;Self Fed Pharyngeal  Phase Impairments: Cough - Immediate    Nectar Thick Nectar Thick Liquid: Impaired Presentation: Self Fed;Cup Pharyngeal Phase Impairments: Cough - Immediate Other Comments: cough eliminated with chin tuck   Honey Thick Honey Thick Liquid: Not tested   Puree Puree: Within functional limits Presentation: Self Fed;Spoon   Solid   GO   Solid: Within functional limits Presentation: Self Fed        Blenda MountsCouture, Amairany Schumpert Laurice 10/11/2015,12:52 PM

## 2015-10-11 NOTE — Consult Note (Signed)
Reason for Consult: Hoarseness and dysphagia Referring Physician: Pulmonary  Bryan Gallegos is an 40 y.o. male.  HPI: History of reaction to a ACE inhibitor with angioedema. He was intubated and its controversial whether it was difficult and traumatic on the 29th. He then was extubated and had a hoarse voice from that point. He also has a new onset difficulty with swallowing liquids which causes him to cough. He is able to get food down without difficulty. It does have some discomfort to talking. He has a history of sleep apnea and wears C Pap. The wife states he is making a rattling sound when he is sleeping. He does feel like he is choking when he drinks liquids and starts coughing. He doesn't have any stridor during the day. He does have shortness of breath with walking. He apparently has been on protonix for a while for reflux.  Past Medical History:  Diagnosis Date  . Adult ADHD   . Anaphylactic reaction   . Anxiety   . Asthma   . Bipolar disorder (HCC)   . Complication of anesthesia    Per patient difficult intubation;  . Diabetes mellitus   . Difficult intubation    Per patient  . Heart valve disorder    s/p echocardiogram  . Hyperlipidemia   . Hypertension   . Morbidly obese (HCC)   . OSA (obstructive sleep apnea)   . Transient cerebral ischemia    Unknown    Past Surgical History:  Procedure Laterality Date  . CARPAL TUNNEL RELEASE    . ESOPHAGOGASTRODUODENOSCOPY N/A 04/05/2015   Procedure: ESOPHAGOGASTRODUODENOSCOPY (EGD);  Surgeon: Malissa Hippo, MD;  Location: [facility];  Service: Endoscopy;  Laterality: N/A;  . ESOPHAGOGASTRODUODENOSCOPY (EGD) WITH PROPOFOL N/A 05/21/2015   Procedure: ESOPHAGOGASTRODUODENOSCOPY (EGD) WITH PROPOFOL;  Surgeon: Meryl Dare, MD;  Location: [facility];  Service: Endoscopy;  Laterality: N/A;  . NOSE SURGERY    . TOOTH EXTRACTION    . WISDOM TOOTH EXTRACTION      Family History  Problem Relation Age of Onset  . CAD Mother      Living  . Diabetes Mellitus II Mother   . Stroke Mother   . Hypertension Mother   . Congestive Heart Failure Mother   . Kidney disease Mother   . Fibromyalgia Mother   . Thyroid disease Mother   . Hyperlipidemia Mother   . Liver disease Mother   . Alcoholism Father 102    Deceased  . Arthritis Maternal Grandmother   . Congestive Heart Failure Maternal Grandmother   . Hypertension Maternal Grandmother   . Lung cancer Maternal Grandfather   . Colon cancer Maternal Aunt   . Stomach cancer Maternal Aunt   . Heart disease Other     Paternal & Maternal  . Crohn's disease Other   . Hypertension Other     Paternal & Maternal  . Hypertension Brother     x3  . Hypertension Sister     #1  . Bipolar disorder Sister     #1  . ADD / ADHD Son     x3  . Bipolar disorder Son     x3  . Asperger's syndrome Son     Social History:  reports that he has been smoking Cigarettes.  He has a 11.50 pack-year smoking history. He has never used smokeless tobacco. He reports that he does not drink alcohol or use drugs.  Allergies:  Allergies  Allergen Reactions  . Atenolol Anaphylaxis  .  Lisinopril Anaphylaxis    NO ACE INHIBITORS!!!  . Prednisone Hives  . Tylenol [Acetaminophen] Other (See Comments)    GI Bleed    Medications: I have reviewed the patient's current medications.  Results for orders placed or performed during the hospital encounter of 10/08/15 (from the past 48 hour(s))  Blood gas, arterial     Status: Abnormal   Collection Time: 10/09/15  1:47 PM  Result Value Ref Range   O2 Content 3.0 L/min   Delivery systems NASAL CANNULA    pH, Arterial 7.391 7.350 - 7.450   pCO2 arterial 48.6 (H) 35.0 - 45.0 mmHg   pO2, Arterial 61.6 (L) 80.0 - 100.0 mmHg   Bicarbonate 28.9 (H) 20.0 - 24.0 mEq/L   TCO2 25.9 0 - 100 mmol/L   Acid-Base Excess 3.6 (H) 0.0 - 2.0 mmol/L   O2 Saturation 90.7 %   Patient temperature 98.6    Collection site RIGHT RADIAL    Drawn by [phone]     Sample type ARTERIAL DRAW    Allens test (pass/fail) PASS PASS  Glucose, capillary     Status: Abnormal   Collection Time: 10/09/15  4:28 PM  Result Value Ref Range   Glucose-Capillary 375 (H) 65 - 99 mg/dL   Comment 1 Notify RN    Comment 2 Document in Chart   Glucose, capillary     Status: Abnormal   Collection Time: 10/09/15 10:04 PM  Result Value Ref Range   Glucose-Capillary 349 (H) 65 - 99 mg/dL  Glucose, capillary     Status: Abnormal   Collection Time: 10/10/15  7:24 AM  Result Value Ref Range   Glucose-Capillary 315 (H) 65 - 99 mg/dL  Glucose, capillary     Status: Abnormal   Collection Time: 10/10/15 11:33 AM  Result Value Ref Range   Glucose-Capillary 354 (H) 65 - 99 mg/dL  Blood gas, arterial     Status: Abnormal   Collection Time: 10/10/15  1:57 PM  Result Value Ref Range   FIO2 0.21    pH, Arterial 7.414 7.350 - 7.450   pCO2 arterial 42.1 35.0 - 45.0 mmHg   pO2, Arterial 80.6 80.0 - 100.0 mmHg   Bicarbonate 26.4 (H) 20.0 - 24.0 mEq/L   TCO2 23.6 0 - 100 mmol/L   Acid-Base Excess 2.1 (H) 0.0 - 2.0 mmol/L   O2 Saturation 95.6 %   Patient temperature 98.7    Collection site RIGHT RADIAL    Drawn by [phone]    Sample type ARTERIAL DRAW    Allens test (pass/fail) PASS PASS  Glucose, capillary     Status: Abnormal   Collection Time: 10/10/15  5:21 PM  Result Value Ref Range   Glucose-Capillary 378 (H) 65 - 99 mg/dL  Glucose, capillary     Status: Abnormal   Collection Time: 10/10/15 11:00 PM  Result Value Ref Range   Glucose-Capillary 406 (H) 65 - 99 mg/dL  Glucose, capillary     Status: Abnormal   Collection Time: 10/11/15  7:43 AM  Result Value Ref Range   Glucose-Capillary 395 (H) 65 - 99 mg/dL   Comment 1 Notify RN     No results found.  ROS Blood pressure (!) 107/39, pulse 82, temperature 98.3 F (36.8 C), temperature source Oral, resp. rate 20, height 6\' 2"  (1.88 m), weight (!) 171.9 kg (379 lb), SpO2 90 %. Physical Exam  Constitutional: He  appears well-developed and well-nourished.  HENT:  Head: Normocephalic.  Nose: Nose normal.  Mouth/Throat:  Oropharynx is clear and moist.  Fiberoptic exam reveals some bluish material in his nose streaking down the floor. It looks like a dye. Patient states he coughed on a-colored medication that may be what it is. The nasopharynx is clear. The base of tongue is normal. Epiglottis is normal. His vocal cords are somewhat thickened and they do not abduct fully. He doesn't have any stridor. The vocal cords do appear to move normally. They seem to be tight. There is a definite fairly significant amount of interarytenoid and arytenoid edema. This is not watery angioedema like appearance just thickening more like chronic reflux. Piriforms look to be clear.  Eyes: Conjunctivae are normal. Pupils are equal, round, and reactive to light.  Neck: Normal range of motion.    Assessment/Plan: Vocal cord dysfunction/laryngeal pharyngeal reflux/dysphagia-the appearance of his vocal cords do not explain why he is not able to swallow liquids. The vocal cords are actually more closed than they should be and look to adduct normally. They are narrowed with regard to the abduction so I can see why he might have a little bit of difficulty with increased activity. They do fluctuate as I was watching his vocal cords meaning they do open up more than when he is concentrating on the exam. This is likely vocal cord spasm. I think that a increased more aggressive reflux regimen is more than appropriate with twice a day proton pump inhibitor and 300 mg of Zantac at night. A benzodiazepine may also be beneficial for relaxation if possible. A swallow test is planned for today but right now I can explain exactly why he can't swallow liquids based on his laryngeal exam. He may need a gastroenterology evaluation depending on what the swallow test shows.  Suzanna Obey 10/11/2015, 10:57 AM
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
## 2015-10-11 NOTE — Progress Notes (Signed)
Paged Dr. Melynda RippleHobbs concerning CBG [phone]. Awaiting return call

## 2015-10-11 NOTE — Progress Notes (Addendum)
1245 Pt's blood sugar 462, message sent to Dr Melynda RippleHobbs. Paged 2 more times. Still waiting to call back. Tried to call cell number but not avail. New orders obtained.

## 2015-10-11 NOTE — Consult Note (Signed)
   Name: Bryan Gallegos MRN: [id number] DOB: 03/24/1975    ADMISSION DATE:  10/08/2015 CONSULTATION DATE:  8/11  REFERRING MD :  Gwenlyn PerkingMadera  CHIEF COMPLAINT:  Hypoxia and upper airway obstruction   BRIEF PATIENT DESCRIPTION:  MO white male w/ known h/o severe OSA (not currently on CPAP),   intubated 7/30 to 8/1 for what was felt to be Ace-I related angioedema. Admitted 8/9 w/ on going cough, vocal hoarseness, dyspnea and occasional desaturation. Treated w/ BDs, steroids and nocturnal CPAP. PCCM was asked to see to assist w/ further evaluation of these complaints.   SIGNIFICANT EVENTS    STUDIES:  8/5 CT neck: 1. No acute or focal lesion to explain the patient's symptoms.2. Negative CT of the neck. MBS 8/12>>>    SUBJECTIVE:  Feels a little better   VITAL SIGNS: Temp:  [98.3 F (36.8 C)-98.6 F (37 C)] 98.3 F (36.8 C) (08/12 0524) Pulse Rate:  [82-94] 82 (08/12 0524) Resp:  [18-20] 20 (08/11 1518) BP: (107-155)/(39-74) 107/39 (08/12 0524) SpO2:  [90 %-99 %] 90 % (08/12 0810)  FIO2 =  RA  PHYSICAL EXAMINATION: General:  Obese male, pleasant and cooperative. Sitting up on side of bed Neuro:  Awake, oriented. No focal def  HEENT:    Voice quality is raspy  Cardiovascular:  Distant rrr Lungs:  Decreased with lots of transmitted upper airway wheezing  Abdomen:  Obese + bowel sounds  Musculoskeletal:  Equal st and bulk  Skin:  LE edema, LE dressings intact    Recent Labs Lab 10/04/15 1641 10/08/15 1708 10/09/15 0306  NA 138 140 136  K 4.6 4.0 5.1  CL 103 102 102  CO2 25 31 31   BUN 7 8 9   CREATININE 0.71 0.69 0.75  GLUCOSE 158* 143* 224*    Recent Labs Lab 10/04/15 1641 10/08/15 1708 10/09/15 0306  HGB 13.8 13.8 13.8  HCT 40.4 40.7 41.8  WBC 15.3* 7.8 9.1  PLT PLATELET CLUMPS NOTED ON SMEAR, UNABLE TO ESTIMATE 194 150   No results found.  ASSESSMENT / PLAN:  Acute Hypoxic and hypercarbic respiratory failure (now resolved) Severe OSA Probable  OHS On-going Dyspnea, cough, and   Hoarseness c/w VCD  In pt off acei ?? When last dose?? (different hx's from pt/wife/chart) but effect can last 6 weeks p last dose  -Suspect this is multifactorial and d/t upper airway pathology such as vocal cord injury from difficult intubation superimposed on his underlying severe OSA and probably OHS.   discsused with DR Rosalie DoctorByrers, he will see 8/12   Plan Cont steroids for now Cont max GERD rx Keep ACE I as allergy and NEVER resume Swallow eval by SLP  Severe OSA and probable OHS Plan Needs walking oximetry prior to dc to determine O2 needs Cont lasix  CP to set him up w/ out-pt split study (last PSG was 5 yrs ago) He will not qualify for BIPAP based on his ABG but should be able to get CPAP if PSG ordered We will need to see him in our office after dc to f/u with either: Dr Maple HudsonYoung, Dr Vassie LollAlva, Dr Craige CottaSood  or Dr Clayborn Bignessedios   - continue protonix    Cigarette use  Reported hx of COPD >> no PFT's in system. - smoking cessation - continue nicotine patch - continue BDs - Abx per primary team >> suspect Abx can be d/c'ed soon - needs pfts as outpt  Updated pt's wife at bedside.

## 2015-10-11 NOTE — Progress Notes (Signed)
1400 Swallow eval was done by SLP, recommended nectar thick liquids. Liquid thickener obtained, instructed pt on how to use it on liquids. Educated pt on aspiration precautions.

## 2015-10-11 NOTE — Progress Notes (Signed)
RT NOTE:  Pt is able to put CPAP on when he is ready for sleep. RT will monitor

## 2015-10-12 ENCOUNTER — Inpatient Hospital Stay (HOSPITAL_COMMUNITY): Payer: Medicare Other

## 2015-10-12 DIAGNOSIS — J45901 Unspecified asthma with (acute) exacerbation: Secondary | ICD-10-CM

## 2015-10-12 LAB — CBC WITH DIFFERENTIAL/PLATELET
BASOS PCT: 0 %
Basophils Absolute: 0 10*3/uL (ref 0.0–0.1)
Eosinophils Absolute: 0 10*3/uL (ref 0.0–0.7)
Eosinophils Relative: 0 %
HEMATOCRIT: 38.8 % — AB (ref 39.0–52.0)
HEMOGLOBIN: 12.9 g/dL — AB (ref 13.0–17.0)
LYMPHS ABS: 3.5 10*3/uL (ref 0.7–4.0)
Lymphocytes Relative: 30 %
MCH: 30.8 pg (ref 26.0–34.0)
MCHC: 33.2 g/dL (ref 30.0–36.0)
MCV: 92.6 fL (ref 78.0–100.0)
MONOS PCT: 8 %
Monocytes Absolute: 0.9 10*3/uL (ref 0.1–1.0)
NEUTROS ABS: 7.2 10*3/uL (ref 1.7–7.7)
NEUTROS PCT: 62 %
PLATELETS: 176 10*3/uL (ref 150–400)
RBC: 4.19 MIL/uL — ABNORMAL LOW (ref 4.22–5.81)
RDW: 13.4 % (ref 11.5–15.5)
WBC: 11.7 10*3/uL — ABNORMAL HIGH (ref 4.0–10.5)

## 2015-10-12 LAB — BASIC METABOLIC PANEL
Anion gap: 9 (ref 5–15)
BUN: 13 mg/dL (ref 6–20)
CALCIUM: 8.7 mg/dL — AB (ref 8.9–10.3)
CHLORIDE: 100 mmol/L — AB (ref 101–111)
CO2: 30 mmol/L (ref 22–32)
CREATININE: 0.76 mg/dL (ref 0.61–1.24)
Glucose, Bld: 172 mg/dL — ABNORMAL HIGH (ref 65–99)
Potassium: 3.6 mmol/L (ref 3.5–5.1)
SODIUM: 139 mmol/L (ref 135–145)

## 2015-10-12 LAB — GLUCOSE, CAPILLARY
Glucose-Capillary: 130 mg/dL — ABNORMAL HIGH (ref 65–99)
Glucose-Capillary: 252 mg/dL — ABNORMAL HIGH (ref 65–99)
Glucose-Capillary: 410 mg/dL — ABNORMAL HIGH (ref 65–99)
Glucose-Capillary: 385 mg/dL — ABNORMAL HIGH (ref 65–99)
Glucose-Capillary: 274 mg/dL — ABNORMAL HIGH (ref 65–99)

## 2015-10-12 MED ORDER — DOXYCYCLINE HYCLATE 100 MG PO TABS
100.0000 mg | ORAL_TABLET | Freq: Two times a day (BID) | ORAL | Status: DC
  Administered 2015-10-12 – 2015-10-13 (×2): 100 mg via ORAL
  Filled 2015-10-12 (×2): qty 1

## 2015-10-12 MED ORDER — INSULIN GLARGINE 100 UNIT/ML ~~LOC~~ SOLN: 30.0000 [IU] | Freq: Every day | SUBCUTANEOUS | 0 refills | Status: DC

## 2015-10-12 MED ORDER — DOXYCYCLINE HYCLATE 100 MG PO TABS: 100.0000 mg | ORAL_TABLET | Freq: Once | ORAL | Status: DC

## 2015-10-12 MED ORDER — METHYLPREDNISOLONE SODIUM SUCC 40 MG IJ SOLR
40.0000 mg | Freq: Two times a day (BID) | INTRAMUSCULAR | Status: DC
  Administered 2015-10-12 – 2015-10-13 (×3): 40 mg via INTRAVENOUS
  Filled 2015-10-12 (×3): qty 1

## 2015-10-12 MED ORDER — IPRATROPIUM-ALBUTEROL 0.5-2.5 (3) MG/3ML IN SOLN
3.0000 mL | Freq: Two times a day (BID) | RESPIRATORY_TRACT | Status: DC
  Administered 2015-10-13: 3 mL via RESPIRATORY_TRACT
  Filled 2015-10-12: qty 3

## 2015-10-12 MED ORDER — DOXYCYCLINE HYCLATE 100 MG PO TABS: 100.0000 mg | ORAL_TABLET | Freq: Two times a day (BID) | ORAL | 0 refills | Status: DC

## 2015-10-12 MED ORDER — OMEPRAZOLE 40 MG PO CPDR: 40.0000 mg | DELAYED_RELEASE_CAPSULE | Freq: Every day | ORAL | 0 refills | Status: DC

## 2015-10-12 NOTE — Progress Notes (Signed)
Faxed [employer] documentation that needs to be completed by attending for oxygen and Trilogy vent to 6N. [facility] Please see previous NCM notes.   [employer] [id number]#[phone] fax [phone].  [employer] rep Mena Goes([place] [phone]). Contacted [place] to make aware [facility]. Left message on Voicemail.

## 2015-10-12 NOTE — Discharge Instructions (Addendum)
Record your sugars carefully while starting your new insulin regimen outside of the hospital. You may try lantus at 30u alone at first to see how it affects you.  Respiratory failure is when your lungs are not working well and your breathing (respiratory) system fails. When respiratory failure occurs, it is difficult for your lungs to get enough oxygen, get rid of carbon dioxide, or both. Respiratory failure can be life threatening.  Respiratory failure can be acute or chronic. Acute respiratory failure is sudden, severe, and requires emergency medical treatment. Chronic respiratory failure is less severe, happens over time, and requires ongoing treatment.  WHAT ARE THE CAUSES OF ACUTE RESPIRATORY FAILURE?  Any problem affecting the heart or lungs can cause acute respiratory failure. Some of these causes include the following:  Chronic bronchitis and emphysema (COPD).   Blood clot going to a lung (pulmonary embolism).   Having water in the lungs caused by heart failure, lung injury, or infection (pulmonary edema).   Collapsed lung (pneumothorax).   Pneumonia.   Pulmonary fibrosis.   Obesity.   Asthma.   Heart failure.   Any type of trauma to the chest that can make breathing difficult.   Nerve or muscle diseases making chest movements difficult. HOW WILL MY ACUTE RESPIRATORY FAILURE BE TREATED?  Treatment of acute respiratory failure depends on the cause of the respiratory failure. Usually, you will stay in the intensive care unit so your breathing can be watched closely. Treatment can include the following:  Oxygen. Oxygen can be delivered through the following:  Nasal cannula. This is small tubing that goes in your nose to give you oxygen.  Face mask. A face mask covers your nose and mouth to give you oxygen.  Medicine. Different medicines can be given to help with breathing. These can include:  Nebulizers. Nebulizers deliver medicines to open the air passages  (bronchodilators). These medicines help to open or relax the airways in the lungs so you can breathe better. They can also help loosen mucus from your lungs.  Diuretics. Diuretic medicines can help you breathe better by getting rid of extra water in your body.  Steroids. Steroid medicines can help decrease swelling (inflammation) in your lungs.  Antibiotics.  Chest tube. If you have a collapsed lung (pneumothorax), a chest tube is placed to help reinflate the lung.  Noninvasive positive pressure ventilation (NPPV). This is a tight-fitting mask that goes over your nose and mouth. The mask has tubing that is attached to a machine. The machine blows air into the tubing, which helps to keep the tiny air sacs (alveoli) in your lungs open. This machine allows you to breathe on your own.  Ventilator. A ventilator is a breathing machine. When on a ventilator, a breathing tube is put into the lungs. A ventilator is used when you can no longer breathe well enough on your own. You may have low oxygen levels or high carbon dioxide (CO2) levels in your blood. When you are on a ventilator, sedation and pain medicines are given to make you sleep so your lungs can heal. SEEK IMMEDIATE MEDICAL CARE IF:  You have shortness of breath (dyspnea) with or without activity.  You have rapid breathing (tachypnea).  You are wheezing.  You are unable to say more than a few words without having to catch your breath.  You find it very difficult to function normally.  You have a fast heart rate.  You have a bluish color to your finger or toe nail beds.  You have confusion or drowsiness or both.   This information is not intended to replace advice given to you by your health care provider. Make sure you discuss any questions you have with your health care provider.   Document Released: 02/20/2013 Document Revised: 11/06/2014 Document Reviewed: 02/20/2013 Elsevier Interactive Patient Education 2016 [employer].   Hoarseness Hoarseness is any abnormal change in your voice.Hoarseness can make it difficult to speak. Your voice may sound raspy, breathy, or strained. Hoarseness is caused by a problem with the vocal cords. The vocal cords are two bands of tissue inside your voice box (larynx). When you speak, your vocal cords move back and forth to create sound. The surfaces of your vocal cords need to be smooth for your voice to sound clear. Swelling or lumps on the vocal cords can cause hoarseness. Common causes of vocal cord problems include:  Upper airway infection.  A long-term cough.  Straining or overusing your voice.  Smoking.  Allergies.  Vocal cord growths.  Stomach acids that flow up from your stomach and irritate your vocal cords (gastroesophageal reflux). HOME CARE INSTRUCTIONS Watch your condition for any changes. To ease any discomfort that you feel:  Rest your voice. Do not whisper. Whispering can cause muscle strain.  Do not speak in a loud or harsh voice that makes your hoarseness worse.  Do not use any tobacco products, including cigarettes, chewing tobacco, or electronic cigarettes. If you need help quitting, ask your health care provider.  Avoid secondhand smoke.  Do not eat foods that give you heartburn. Heartburn can make gastroesophageal reflux worse.  Do not drink coffee.  Do not drink alcohol.  Drink enough fluids to keep your urine clear or pale yellow.  Use a humidifier if the air in your home is dry. SEEK MEDICAL CARE IF:  You have hoarseness that lasts longer than 3 weeks.  You almost lose or completelylose your voice for longer than 3 days.  You have pain when you swallow or try to talk.  You feel a lump in your neck. SEEK IMMEDIATE MEDICAL CARE IF:  You have trouble swallowing.  You feel as though you are choking when you swallow.  You cough up blood or vomit blood.  You have trouble breathing.   This information is not intended to  replace advice given to you by your health care provider. Make sure you discuss any questions you have with your health care provider.   Document Released: 01/29/2005 Document Revised: 07/02/2014 Document Reviewed: 02/06/2014 Elsevier Interactive Patient Education 2016 Elsevier Inc.  Sleep Apnea  Sleep apnea is a sleep disorder characterized by abnormal pauses in breathing while you sleep. When your breathing pauses, the level of oxygen in your blood decreases. This causes you to move out of deep sleep and into light sleep. As a result, your quality of sleep is poor, and the system that carries your blood throughout your body (cardiovascular system) experiences stress. If sleep apnea remains untreated, the following conditions can develop:  High blood pressure (hypertension).  Coronary artery disease.  Inability to achieve or maintain an erection (impotence).  Impairment of your thought process (cognitive dysfunction). There are three types of sleep apnea: 1. Obstructive sleep apnea--Pauses in breathing during sleep because of a blocked airway. 2. Central sleep apnea--Pauses in breathing during sleep because the area of the brain that controls your breathing does not send the correct signals to the muscles that control breathing. 3. Mixed sleep apnea--A combination of both obstructive  and central sleep apnea. RISK FACTORS The following risk factors can increase your risk of developing sleep apnea:  Being overweight.  Smoking.  Having narrow passages in your nose and throat.  Being of older age.  Being male.  Alcohol use.  Sedative and tranquilizer use.  Ethnicity. Among individuals younger than 35 years, African Americans are at increased risk of sleep apnea. SYMPTOMS   Difficulty staying asleep.  Daytime sleepiness and fatigue.  Loss of energy.  Irritability.  Loud, heavy snoring.  Morning headaches.  Trouble concentrating.  Forgetfulness.  Decreased interest  in sex.  Unexplained sleepiness. DIAGNOSIS  In order to diagnose sleep apnea, your caregiver will perform a physical examination. A sleep study done in the comfort of your own home may be appropriate if you are otherwise healthy. Your caregiver may also recommend that you spend the night in a sleep lab. In the sleep lab, several monitors record information about your heart, lungs, and brain while you sleep. Your leg and arm movements and blood oxygen level are also recorded. TREATMENT The following actions may help to resolve mild sleep apnea:  Sleeping on your side.   Using a decongestant if you have nasal congestion.   Avoiding the use of depressants, including alcohol, sedatives, and narcotics.   Losing weight and modifying your diet if you are overweight. There also are devices and treatments to help open your airway:  Oral appliances. These are custom-made mouthpieces that shift your lower jaw forward and slightly open your bite. This opens your airway.  Devices that create positive airway pressure. This positive pressure "splints" your airway open to help you breathe better during sleep. The following devices create positive airway pressure:  Continuous positive airway pressure (CPAP) device. The CPAP device creates a continuous level of air pressure with an air pump. The air is delivered to your airway through a mask while you sleep. This continuous pressure keeps your airway open.  Nasal expiratory positive airway pressure (EPAP) device. The EPAP device creates positive air pressure as you exhale. The device consists of single-use valves, which are inserted into each nostril and held in place by adhesive. The valves create very little resistance when you inhale but create much more resistance when you exhale. That increased resistance creates the positive airway pressure. This positive pressure while you exhale keeps your airway open, making it easier to breath when you inhale  again.  Bilevel positive airway pressure (BPAP) device. The BPAP device is used mainly in patients with central sleep apnea. This device is similar to the CPAP device because it also uses an air pump to deliver continuous air pressure through a mask. However, with the BPAP machine, the pressure is set at two different levels. The pressure when you exhale is lower than the pressure when you inhale.  Surgery. Typically, surgery is only done if you cannot comply with less invasive treatments or if the less invasive treatments do not improve your condition. Surgery involves removing excess tissue in your airway to create a wider passage way.   This information is not intended to replace advice given to you by your health care provider. Make sure you discuss any questions you have with your health care provider.   Document Released: 02/05/2002 Document Revised: 03/08/2014 Document Reviewed: 06/24/2011 Elsevier Interactive Patient Education [employer]
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
## 2015-10-12 NOTE — Progress Notes (Signed)
Pt placs self on/of cpap.  Rt will monitor.

## 2015-10-12 NOTE — Progress Notes (Signed)
Aspiration precaution maintained, pt is constantly reminded to use a thickener for nectar thick liquids.

## 2015-10-12 NOTE — Progress Notes (Signed)
Speech Pathology:  MBSS complete. Full report located under chart review in imaging section.  Jarvin Ogren L. Delshawn Stech, MA CCC/SLP Pager [phone]   

## 2015-10-12 NOTE — Progress Notes (Signed)
Unable to discharge patient at this time as all needs have not yet been met. Home C-pap, possible home 02, continued education on aspiration precautions, as well as medication and a ride home still need to be addressed. Pulmonology physician has also again placed patient on IV  Solu-medrol. CBG's continue to run in the 400's Social work, case management, as well as dietary/nutriation to consult in the AM. Will continue to educate patient this evening from a nursing stand point. Will continue to monitor. Jimmie Molly, RN

## 2015-10-13 DIAGNOSIS — Z794 Long term (current) use of insulin: Secondary | ICD-10-CM

## 2015-10-13 DIAGNOSIS — E119 Type 2 diabetes mellitus without complications: Secondary | ICD-10-CM

## 2015-10-13 LAB — GLUCOSE, CAPILLARY
Glucose-Capillary: 338 mg/dL — ABNORMAL HIGH (ref 65–99)
Glucose-Capillary: 345 mg/dL — ABNORMAL HIGH (ref 65–99)
Glucose-Capillary: 357 mg/dL — ABNORMAL HIGH (ref 65–99)

## 2015-10-13 MED ORDER — BLOOD GLUCOSE MONITOR KIT: PACK | 0 refills | Status: DC

## 2015-10-13 MED ORDER — OMEPRAZOLE 40 MG PO CPDR: 40.0000 mg | DELAYED_RELEASE_CAPSULE | Freq: Two times a day (BID) | ORAL | 0 refills | Status: DC

## 2015-10-13 MED ORDER — INSULIN GLARGINE 100 UNIT/ML ~~LOC~~ SOLN: 20.0000 [IU] | Freq: Every day | SUBCUTANEOUS | 0 refills | Status: DC

## 2015-10-13 MED ORDER — METHYLPREDNISOLONE 8 MG PO TABS: ORAL_TABLET | ORAL | 0 refills | Status: DC

## 2015-10-13 MED ORDER — LIVING WELL WITH DIABETES BOOK
Freq: Once | Status: AC
  Administered 2015-10-13: 18:00:00
  Filled 2015-10-13: qty 1

## 2015-10-13 MED ORDER — INSULIN STARTER KIT- SYRINGES (ENGLISH): 1.0000 | Freq: Once | 0 refills | Status: AC

## 2015-10-13 MED ORDER — INSULIN STARTER KIT- SYRINGES (ENGLISH)
1.0000 | Freq: Once | Status: AC
  Administered 2015-10-13: 1
  Filled 2015-10-13: qty 1

## 2015-10-13 MED ORDER — GABAPENTIN 400 MG PO CAPS: 800.0000 mg | ORAL_CAPSULE | Freq: Three times a day (TID) | ORAL | 0 refills | Status: DC

## 2015-10-13 MED ORDER — ALBUTEROL SULFATE HFA 108 (90 BASE) MCG/ACT IN AERS: 2.0000 | INHALATION_SPRAY | RESPIRATORY_TRACT | 0 refills | Status: DC | PRN

## 2015-10-13 NOTE — Progress Notes (Signed)
Discharge home. Diabetic teaching given, wife at bedside stating she is a Scientist, clinical (histocompatibility and immunogenetics)med tech and know how to administer insulin. Showed to this writer on how to give insulin to husband. Patient educated about his diet and medication. Home CPAP was delivered and set up by case manager. No questions were verbalized .

## 2015-10-13 NOTE — Progress Notes (Addendum)
Inpatient Diabetes Program Recommendations  AACE/ADA: New Consensus Statement on Inpatient Glycemic Control (2015)  Target Ranges:  Prepandial:   less than 140 mg/dL      Peak postprandial:   less than 180 mg/dL (1-2 hours)      Critically ill patients:  140 - 180 mg/dL   Lab Results  Component Value Date   GLUCAP 345 (H) 10/13/2015   HGBA1C 5.9 (H) 05/17/2015    Review of Glycemic Control:  Results for Bryan Gallegos, Barrie L (MRN [id number]) as of 10/13/2015 14:03  Ref. Range 10/12/2015 15:37 10/12/2015 17:55 10/12/2015 22:28 10/13/2015 08:41 10/13/2015 12:28  Glucose-Capillary Latest Ref Range: 65 - 99 mg/dL 409410 (H) 811385 (H) 914274 (H) 338 (H) 345 (H)   Diabetes history: Type 2 diabetes Outpatient Diabetes medications: Metformin 1000 mg bid, NPH (patient states he never took) Current orders for Inpatient glycemic control:  Novolog resistant tid with meals and HS, Novolog 3 units tid with meals, Lantus 30 units q HS  Inpatient Diabetes Program Recommendations:    Note patient to be d/c'd home on insulin per AVS.  Discussed with patient and he states that he has never been on insulin in the past but only on Metformin.  Last A1C in March was 5.9%.  Patient does not have meter.  If he is not going home on steroids, blood sugars may improve as his admit blood glucose was 143 mg/dL- prior to starting steroids.  Will need to make sure that patient teaching done regarding insulin administration and monitoring prior to d/c.  Sent Text page to Dr. Irene LimboGoodrich.  Discussed with RN.  Thanks, Beryl MeagerJenny Moise Friday, RN, BC-ADM Inpatient Diabetes Coordinator Pager [phone] (8a-5p)

## 2015-10-13 NOTE — Progress Notes (Signed)
PCCM Brief Interval Progress Note  Chart reviewed and discussed case with Dr Marchelle Gearingamaswamy.   Patient needs f/u as outpt in office regarding OSA and UA irritation + cough.   Appointment set > 12/01/15 with Dr Vassie LollAlva at 2:45pm  Please call if we can assist further    Levy Pupaobert Dekayla Prestridge, MD, PhD 10/13/2015, 1:06 PM [facility] [phone] or if no answer [phone]

## 2015-10-13 NOTE — Progress Notes (Signed)
Speech Language Pathology Treatment: Dysphagia  Patient Details Name: Bryan Gallegos MRN: [id number] DOB: 10/21/1975 Today's Date: 10/13/2015 Time: [id number] SLP Time Calculation (min) (ACUTE ONLY): 19 min  Assessment / Plan / Recommendation Clinical Impression  Pt seen for dysphagia followup. Pt reports he has been coughing with liquids all day. When asked to demonstrate how he has been drinking, pt took a large cup sip of thin with head in neutral position and immediately demonstrated a strong cough. Education completed with pt and his spouse re: need for nectar-thick liquids, small sips with chin tuck. Education provided on how to thicken liquids to nectar thick with pt and family verbalizing understanding. Pt was able to demonstrate appropriately sized boluses with chin tuck with only intermittent throat clear. Discussed that his prescribed compensatory strategies are necessary and non-negotiable to reduce risk of aspiration. Pt verbalized understanding. Plan is for outpatient FEES in 7-10 days to assess progress and readiness to upgrade. Requested order from physician in discharge summary.    HPI HPI: 40 y.o. male w/ known h/o severe OSA. His most recent hx is significant for a hospital admit on 7/30 for suicidal intent and multidrug ingestion. While in ER developed acute respiratory distress, abnormal phonation and concern for evolving angioedema (felt due to Ace-I). He was emergently intubated via fiberoptic bronchoscopy. He was subsequently extubated 8/1, sent home on 8/2 w/ predtaper and instructed never to take Ace-Is again. Since his discharge he has had persistent cough (especially any time he eats or drinks), loud audible wheeze w/ activity and when he is in the supine position (per his significant other), significant shortness of breath and vocal hoarseness.  He eventually presented to the ED on 8/9 w/ cc: chest pain, cough and shortness of breath. Also incidentally noted to have what  looked like LE cellulitis. His room air sats were in 80s and PCO2 hwas 78 so he was admitted for further evaluation and treated w/ working dx of: possible bronchospasm as the etiology of his respiratory failure.  ENT eval 8/12: "Fiberoptic exam reveals some bluish material in his nose streaking down the floor. It looks like a dye. Patient states he coughed on a-colored medication that may be what it is. The nasopharynx is clear. The base of tongue is normal. Epiglottis is normal. His vocal cords are somewhat thickened and they do not abduct fully. He doesn't have any stridor. The vocal cords do appear to move normally. They seem to be tight. There is a definite fairly significant amount of interarytenoid and arytenoid edema. This is not watery angioedema like appearance just thickening more like chronic reflux. Piriforms look to be clear." ENT dx of  "Vocal cord dysfunction/laryngeal pharyngeal reflux/dysphagia-the appearance of his vocal cords do not explain why he is not able to swallow liquids. The vocal cords are actually more closed than they should be and look to adduct normally. They are narrowed with regard to the abduction so I can see why he might have a little bit of difficulty with increased activity. They do fluctuate as I was watching his vocal cords meaning they do open up more than when he is concentrating on the exam. This is likely vocal cord spasm. I think that a increased more aggressive reflux regimen is more than appropriate with twice a day proton pump inhibitor and 300 mg of Zantac at night. A benzodiazepine may also be beneficial for relaxation if possible." SLP eval ordered.       SLP Plan  Continue with current  plan of care     Recommendations  Diet recommendations: Regular;Nectar-thick liquid Liquids provided via: Cup Medication Administration: Whole meds with puree Supervision: Patient able to self feed;Intermittent supervision to cue for compensatory  strategies Compensations: Chin tuck Postural Changes and/or Swallow Maneuvers: Seated upright 90 degrees             Oral Care Recommendations: Oral care BID Follow up Recommendations: Outpatient SLP (outpatient FEES in 7-10 days) Plan: Continue with current plan of care     GO                Rocky CraftsKara E Jaydrien Wassenaar MA, CCC-SLP 10/13/2015, 3:28 PM

## 2015-10-13 NOTE — Care Management Note (Signed)
Case Management Note  Patient Details  Name: Bryan Gallegos MRN: [id number] Date of Birth: 12/08/1975  Subjective/Objective:  Pt for dc home today with significant other.  Pt admitted with respiratory failure, and qualifies for Trilogy vent for home.  Spoke with Pleas Patricia with [employer], who is coordinating arrangements for home equipment. (Phone [phone]).   Orders for Trilogy obtained from Marni Griffon, NP and faxed to [employer] at [phone].  Rep for [employer] to follow up with pt at his home today with ordered DME after discharge.  Pt does not qualify for home oxygen, as sats WNL.                  Action/Plan: Met with pt and significant other to finalize discharge arrangements.  Pt is interested in getting a PCP in [place].  He has Medicare, but no Rx benefits.  He is agreeable to follow up at [facility] and [facility]; meds are deeply discounted there, and pharmacist available on staff to assist with medication assistance programs.  Pt given Good Rx Card for additional discounts on meds.  He does not qualify for [facility] program.    Expected Discharge Date:  10/13/2015    Expected Discharge Plan:  Home/Self Care  In-House Referral:     Discharge planning Services  CM Consult, [facility]  Post Acute Care Choice:  Durable Medical Equipment Choice offered to:  Patient  DME Arranged:  Ventilator DME Agency:  [employer] ([employer])  HH Arranged:  Respirator Therapy HH Agency:     Status of Service:  Completed, signed off  If discussed at [place] of Stay Meetings, dates discussed:    Additional Comments:  Reinaldo Raddle, RN, BSN  Trauma/Neuro ICU Case Manager [phone]

## 2015-10-13 NOTE — Progress Notes (Signed)
SATURATION QUALIFICATIONS: (This note is used to comply with regulatory documentation for home oxygen)  Patient Saturations on Room Air at Rest = 100%  Patient Saturations on Room Air while Ambulating =95%  Patient Saturations on0 Liters of oxygen while Ambulating =0%  Please briefly explain why patient needs home oxygen:

## 2015-10-13 NOTE — Discharge Summary (Signed)
Physician Discharge Summary  CHASETON YEPIZ JOA:[id number] DOB: 10/11/1975 DOA: 10/08/2015  PCP: No PCP Per Patient  Admit date: 10/08/2015 Discharge date: 10/13/2015  Recommendations for Outpatient Follow-up:  1. Follow-up vocal cord dysfunction with pulmonology. Steroid taper recommended per pulmonology. Continue aggressive treatment for GERD with PPI and Zantac as below. 2. Diabetes mellitus type 2 usually on oral medication. Discharged home on insulin because of hyperglycemia while on steroids. Could likely taper insulin and likely discontinue once steroids are complete.  3. Outpatient sleep evaluation  4. Recommend outpatient FEES per ST  Follow-up Information    Your primary care doctor Follow up on 10/13/2015.   Why:  Acute respiratory failure and cellulitis Discuss referral to GI for eval of your severe reflux that is affecting your swallowing       Rigoberto Noel., MD Follow up on 12/01/2015.   Specialty:  Pulmonary Disease Why:  at 245 pm Contact information: [address] [place] [address] [phone]        [facility] Follow up on 10/15/2015.   Why:  11:30 Please bring copy of discharge instructions, insurance card and all medications you are currently taking.   Contact information: [place] [phone]         Discharge Diagnoses:  Acute hypoxic, hypercapnic respiratory failure  Vocal cord dysfunction, spasm  Laryngeal pharyngeal reflux  GERD  Diabetes mellitus type 2  Tobacco use disorder  Severe obstructive sleep apnea  Morbid obesity   Discharge Condition: improved Disposition: home  Diet recommendation: diabetic diet, nectar-thick liquids  Filed Weights   10/08/15 1647  Weight: (!) 171.9 kg (379 lb)    History of present illness:  40 year old man with recent admission for airway compromise secondary to uvular enlargement, likely allergic reaction, required  intubation at that time. Presented again 8/9 with cough, shortness of breath, pleuritic chest pain. Admitted for acute hypoxic respiratory failure of unclear etiology, pleuritic chest pain.58-year-old man with recent admission for airway compromise secondary to uvular enlargement, likely allergic reaction, required intubation at that time. Presented again 8/9 with cough, shortness of breath, pleuritic chest pain. Admitted for acute hypoxic respiratory failure of unclear etiology, pleuritic chest pain.  Hospital Course:  Because of ongoing symptoms with cough, dyspnea and vocal hoarseness he was seen by pulmonology who recommended ENT consultation. Notably noncompliant with treatment, ambulated off the unit several times to smoke. 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 Recommendations for her for GERD therapy. Seen by speech pathology with recommendation for regular solids, nectar thick liquids with chin tuck. Condition improved and he was felt to be stable by pulmonology for discharge.  1. Acute hypoxic, hypercapnic respiratory failure. Resolved. Etiology unclear. Thought secondary to vocal cord dysfunction. Does not meet requirement for oxygen. 2. Cough, vocal cord dysfunction/spasm, laryngeal pharyngeal reflux, dyspnea thought to be multifactorial including difficult intubation superimposed on severe obstructive sleep apnea and possible OHS. Continue treatment for GERD with PPI twice a day and Zantac at night.  3. Pleuritic chest pain. CT angiogram chest negative. No further evaluation suggested. Secondary to cough. 4. Diabetes mellitus, , Fair control while on steroids. Anticipate will need insulin for short period of time. However steroid taper recommended by pulmonology. 5. Tobacco use disorder 6. Severe obstructive sleep apnea 7. Morbid obesity 8. Bipolar disorder, ADHD 9. Wound infection right calf started on oral antibiotics.  Mupirocin ointment to area  twice daily, with non-adherent gauze placed   Per pulmonology qualifies for Trilogy vent at home which they have arranged   No ACE inhibitors for life.  Home today on steroid taper, long-acting insulin. Patient will be educated prior to discharge by nurse. He also has experienced with insulin administration in regard to his mother and his wife as a [employer].  Diet per speech therapy  FEEs swallow assessment as outpatient   Consultants:  Pulmonology  Discharge Instructions  Discharge Instructions    Activity as tolerated - No restrictions    Complete by:  As directed   Ambulatory referral to Sleep Studies    Complete by:  As directed   Discharge instructions    Complete by:  As directed   Call your physician or seek immediate medical attention for blood sugar greater than 400 or less than 70, shortness of breath or worsening of condition. Regular food with nectar thick liquids.       Medication List    STOP taking these medications   clonazePAM 1 MG tablet Commonly known as:  KLONOPIN   dexlansoprazole 60 MG capsule Commonly known as:  DEXILANT   hydrOXYzine 50 MG capsule Commonly known as:  VISTARIL   hydrOXYzine 50 MG tablet Commonly known as:  ATARAX/VISTARIL   insulin NPH Human 100 UNIT/ML injection Commonly known as:  HUMULIN N   ondansetron 8 MG tablet Commonly known as:  ZOFRAN   predniSONE 10 MG tablet Commonly known as:  DELTASONE   sitaGLIPtin-metformin 50-1000 MG tablet Commonly known as:  JANUMET   sulfamethoxazole-trimethoprim 800-160 MG tablet Commonly known as:  BACTRIM DS,SEPTRA DS     TAKE these medications   albuterol 108 (90 Base) MCG/ACT inhaler Commonly known as:  PROVENTIL HFA;VENTOLIN HFA Inhale 2 puffs into the lungs every 2 (two) hours as needed for wheezing or shortness of breath. What changed:  when to take this   alprazolam 2 MG tablet Commonly known as:  XANAX Take 2 mg by mouth 3 (three) times daily  as needed for sleep or anxiety.   amphetamine-dextroamphetamine 30 MG tablet Commonly known as:  ADDERALL Take 30 mg by mouth 2 (two) times daily.   aspirin 325 MG tablet Take 1 tablet (325 mg total) by mouth daily. For heart health   blood glucose meter kit and supplies Kit Dispense based on patient and insurance preference. Use up to four times daily as directed. (FOR ICD-9 250.00, 250.01).   citalopram 20 MG tablet Commonly known as:  CELEXA Take 20 mg by mouth every morning. What changed:  Another medication with the same name was removed. Continue taking this medication, and follow the directions you see here.   divalproex 500 MG DR tablet Commonly known as:  DEPAKOTE Take 500 mg by mouth 2 (two) times daily.   furosemide 20 MG tablet Commonly known as:  LASIX Take 1 tablet (20 mg total) by mouth daily. For swelling   gabapentin 400 MG capsule Commonly known as:  NEURONTIN Take 2 capsules (800 mg total) by mouth 3 (three) times daily. What changed:  additional instructions   ibuprofen 200 MG tablet Commonly known as:  ADVIL,MOTRIN Take 400-600 mg by mouth every 6 (six) hours as needed for mild pain or moderate pain.   insulin glargine 100 UNIT/ML injection Commonly known as:  LANTUS Inject 0.2 mLs (20 Units total) into the skin at bedtime.   insulin starter kit- syringes Misc 1 kit by Other route once.   metFORMIN 1000 MG  tablet Commonly known as:  GLUCOPHAGE Take 1,000 mg by mouth 2 (two) times daily.   methylPREDNISolone 8 MG tablet Commonly known as:  MEDROL 24 mg x 3 days, then 16 mg x3 days, then 8 mg x3 days, then stop.   nicotine 21 mg/24hr patch Commonly known as:  NICODERM CQ - dosed in mg/24 hours Place 1 patch (21 mg total) onto the skin daily.   omeprazole 40 MG capsule Commonly known as:  PRILOSEC Take 1 capsule (40 mg total) by mouth 2 (two) times daily.   oxycodone 30 MG immediate release tablet Commonly known as:  ROXICODONE Take 30 mg  by mouth every 4 (four) hours as needed for pain.   traZODone 50 MG tablet Commonly known as:  DESYREL Take 1 tablet (50 mg total) by mouth at bedtime. For sleep What changed:  how much to take  additional instructions      Allergies  Allergen Reactions  . Atenolol Anaphylaxis  . Lisinopril Anaphylaxis    NO ACE INHIBITORS!!!  . Prednisone Hives  . Tylenol [Acetaminophen] Other (See Comments)    GI Bleed    The results of significant diagnostics from this hospitalization (including imaging, microbiology, ancillary and laboratory) are listed below for reference.    Significant Diagnostic Studies: Dg Chest 2 View  Result Date: 10/08/2015 CLINICAL DATA:  Shortness of breath EXAM: CHEST  2 VIEW COMPARISON:  10/04/2015 FINDINGS: Heart size is normal. There is no pleural effusion or edema. Pulmonary vascular congestion noted. The lung volumes appear low. No airspace consolidation. IMPRESSION: Pulmonary vascular congestion. Electronically Signed   By: Kerby Moors M.D.   On: 10/08/2015 17:30   Dg Chest 2 View  Result Date: 10/04/2015 CLINICAL DATA:  40 year old male with shortness of breath. Pain in the airway. EXAM: CHEST  2 VIEW COMPARISON:  Chest x-ray a 03/2015. FINDINGS: Previously noted endotracheal tube has been removed. Lung volumes are low. No consolidative airspace disease. No pleural effusions. No pneumothorax. No pulmonary nodule or mass noted. Pulmonary vasculature and the cardiomediastinal silhouette are within normal limits. IMPRESSION: 1. Low lung volumes without radiographic evidence of acute cardiopulmonary disease. Electronically Signed   By: Vinnie Langton M.D.   On: 10/04/2015 16:11   Ct Soft Tissue Neck W Contrast  Result Date: 10/04/2015 CLINICAL DATA:  Throat tightness. Midline sternal pain. Recent ER visit for allergic reaction. EXAM: CT NECK WITH CONTRAST TECHNIQUE: Multidetector CT imaging of the neck was performed using the standard protocol following the  bolus administration of intravenous contrast. CONTRAST:  33m ISOVUE-300 IOPAMIDOL (ISOVUE-300) INJECTION 61% COMPARISON:  CT head without contrast 08/21/2015. FINDINGS: Pharynx and larynx: No focal mucosal or submucosal lesions are present. The nasopharynx, oropharynx, and hypopharynx are clear. Vocal cords are midline and symmetric. Salivary glands: The submandibular and parotid glands are within normal limits bilaterally. Thyroid: Unremarkable Lymph nodes: No significant cervical adenopathy is present. Prominent jugulodigastric lymph nodes bilaterally are within normal limits. Vascular: No focal vascular lesions are present. Limited intracranial: Within normal limits. Visualized orbits: Within normal limits Mastoids and visualized paranasal sinuses: The left sphenoid sinus is opacified. There is moderate mucosal thickening throughout the right sphenoid sinus. The visualized anterior paranasal sinuses are clear. Skeleton: No focal lytic or blastic lesions are present. Vertebral body heights and alignment are within normal limits. Upper chest: The lung apices are clear. IMPRESSION: 1. No acute or focal lesion to explain the patient's symptoms. 2. Negative CT of the neck. Electronically Signed   By: CSan Morelle  M.D.   On: 10/04/2015 19:02   Ct Angio Chest Pe W And/or Wo Contrast  Result Date: 10/08/2015 CLINICAL DATA:  Difficulty breathing EXAM: CT ANGIOGRAPHY CHEST WITH CONTRAST TECHNIQUE: Multidetector CT imaging of the chest was performed using the standard protocol during bolus administration of intravenous contrast. Multiplanar CT image reconstructions and MIPs were obtained to evaluate the vascular anatomy. CONTRAST:  100 mL Isovue 370 COMPARISON:  None. FINDINGS: Mediastinum/Lymph Nodes: The thoracic inlet is within normal limits. Mild lipomatosis of the mediastinum is noted. No significant hilar or mediastinal adenopathy is seen. Cardiovascular: The thoracic aorta and its branches show no  aneurysmal dilatation or dissection. Pulmonary artery is well visualized within normal branching pattern. No filling defects to suggest pulmonary emboli are noted. Lungs/Pleura: Mild dependent atelectatic changes are noted. No focal confluent infiltrate is noted. No significant pulmonary edema is noted. Upper abdomen: No acute findings. Musculoskeletal: No chest wall mass or suspicious bone lesions identified. Review of the MIP images confirms the above findings. IMPRESSION: No evidence of pulmonary emboli. No significant CHF. Minimal dependent atelectatic changes are noted. Electronically Signed   By: Inez Catalina M.D.   On: 10/08/2015 19:41   Dg Chest Port 1 View  Result Date: 09/30/2015 CLINICAL DATA:  Shortness of breath. EXAM: PORTABLE CHEST 1 VIEW COMPARISON:  09/29/2015. FINDINGS: Support tubes and lines appear stable. ET tube remains 4 cm above the carina in satisfactory position. Low lung volumes consistent with atelectasis. Hazy density noted yesterday at the LEFT lung base appears improved, with no evidence for effusion. Patchy opacity at the RIGHT lung base could represent edema or infiltrate. IMPRESSION: Slight worsening aeration at the RIGHT lung base. LEFT effusion appears improved. Support tubes and lines stable. Electronically Signed   By: Staci Righter M.D.   On: 09/30/2015 07:08   Dg Chest Port 1 View  Result Date: 09/29/2015 CLINICAL DATA:  Respiratory failure EXAM: PORTABLE CHEST 1 VIEW COMPARISON:  September 28, 2015 FINDINGS: Stable support apparatus including the ETT. Haziness over the left base suggest layering effusion and underlying opacity, likely atelectasis. No overt pulmonary edema. The cardiomediastinal silhouette is stable. IMPRESSION: 1. Haziness over the left base consistent with layering effusion and underlying atelectasis. Stable support apparatus. Electronically Signed   By: Dorise Bullion III M.D   On: 09/29/2015 07:17  Dg Chest Portable 1 View  Result Date:  09/28/2015 CLINICAL DATA:  Central line placement. EXAM: PORTABLE CHEST 1 VIEW COMPARISON:  09/28/2015 FINDINGS: Right central line has been placed with the tip in the SVC. No pneumothorax. Endotracheal tube and NG tube are unchanged. Continued low lung volumes with bibasilar atelectasis and vascular congestion, slightly improved since prior study. IMPRESSION: Right central line tip in the SVC.  No pneumothorax. Improving bibasilar atelectasis and vascular congestion. Electronically Signed   By: Rolm Baptise M.D.   On: 09/28/2015 10:16  Dg Chest Portable 1 View  Result Date: 09/28/2015 CLINICAL DATA:  Post intubation. EXAM: PORTABLE CHEST 1 VIEW COMPARISON:  09/27/2015 FINDINGS: Endotracheal tube is 5 cm above the carina. NG tube enters the stomach. Low lung volumes with bibasilar atelectasis and vascular congestion. No visible effusions. IMPRESSION: Endotracheal tube 5 cm above the carina. Low volumes with vascular congestion and bibasilar atelectasis. Electronically Signed   By: Rolm Baptise M.D.   On: 09/28/2015 09:46  Dg Chest Port 1 View  Result Date: 09/27/2015 CLINICAL DATA:  Overdose. Pt states that he took Oxycodone, klonopin, and xanax. EXAM: PORTABLE CHEST 1 VIEW COMPARISON:  04/11/2015  FINDINGS: Cardiac silhouette is normal in size. No mediastinal or hilar masses or evidence of adenopathy. There are low lung volumes. Allowing for this, the lungs are clear. No pleural effusion or pneumothorax. Bony thorax is grossly intact. IMPRESSION: No active disease. Electronically Signed   By: Lajean Manes M.D.   On: 09/27/2015 22:05  Dg Abd Portable 1v  Result Date: 09/29/2015 CLINICAL DATA:  Orogastric tube placement EXAM: PORTABLE ABDOMEN - 1 VIEW COMPARISON:  Portable exam 1742 hours compared to 09/28/2015 FINDINGS: Tip of nasogastric tube projects over gastric antrum. Atelectasis versus consolidation LEFT lower lobe with probable small LEFT pleural effusion. IMPRESSION: Tip of nasogastric tube  projects over gastric antrum. Atelectasis versus consolidation LEFT lower lobe with probable associated LEFT pleural effusion. Electronically Signed   By: Lavonia Dana M.D.   On: 09/29/2015 18:20   Dg Abd Portable 1v  Result Date: 09/28/2015 CLINICAL DATA:  40 year old male evaluate for enteric tube placement. EXAM: PORTABLE ABDOMEN - 1 VIEW COMPARISON:  CT dated 05/16/2015 FINDINGS: An enteric tube is noted with tip in the left upper abdomen likely in the proximal stomach. No bowel dilatation identified the visualized abdomen. Evaluation however see limited due to patient's body habitus and soft tissue attenuation. The visualized lung bases are clear. IMPRESSION: Enteric tube likely in the proximal stomach. Electronically Signed   By: Anner Crete M.D.   On: 09/28/2015 22:01  Dg Swallowing Func-speech Pathology  Result Date: 10/12/2015 Objective Swallowing Evaluation: Type of Study: MBS-Modified Barium Swallow Study Patient Details Name: Bryan Gallegos MRN: [id number] Date of Birth: 1975/09/15 Today's Date: 10/12/2015 Time: SLP Start Time (ACUTE ONLY): 0930-SLP Stop Time (ACUTE ONLY): 0945 SLP Time Calculation (min) (ACUTE ONLY): 15 min Past Medical History: Past Medical History: Diagnosis Date . Adult ADHD  . Anaphylactic reaction  . Anxiety  . Asthma  . Bipolar disorder ([place])  . Complication of anesthesia   Per patient difficult intubation; . Diabetes mellitus  . Difficult intubation   Per patient . Heart valve disorder   s/p echocardiogram . Hyperlipidemia  . Hypertension  . Morbidly obese ([place])  . OSA (obstructive sleep apnea)  . Transient cerebral ischemia   Unknown Past Surgical History: Past Surgical History: Procedure Laterality Date . CARPAL TUNNEL RELEASE   . ESOPHAGOGASTRODUODENOSCOPY N/A 04/05/2015  Procedure: ESOPHAGOGASTRODUODENOSCOPY (EGD);  Surgeon: Rogene Houston, MD;  Location: [facility];  Service: Endoscopy;  Laterality: N/A; . ESOPHAGOGASTRODUODENOSCOPY (EGD) WITH PROPOFOL N/A  05/21/2015  Procedure: ESOPHAGOGASTRODUODENOSCOPY (EGD) WITH PROPOFOL;  Surgeon: Ladene Artist, MD;  Location: [facility];  Service: Endoscopy;  Laterality: N/A; . NOSE SURGERY   . TOOTH EXTRACTION   . WISDOM TOOTH EXTRACTION   HPI: 40 y.o. male w/ known h/o severe OSA. His most recent hx is significant for a hospital admit on 7/30 for suicidal intent and multidrug ingestion. While in ER developed acute respiratory distress, abnormal phonation and concern for evolving angioedema (felt due to Ace-I). He was emergently intubated via fiberoptic bronchoscopy. He was subsequently extubated 8/1, sent home on 8/2 w/ predtaper and instructed never to take Ace-Is again. Since his discharge he has had persistent cough (especially any time he eats or drinks), loud audible wheeze w/ activity and when he is in the supine position (per his significant other), significant shortness of breath and vocal hoarseness.  He eventually presented to the ED on 8/9 w/ cc: chest pain, cough and shortness of breath. Also incidentally noted to have what looked like LE cellulitis. His room air  sats were in 80s and PCO2 hwas 78 so he was admitted for further evaluation and treated w/ working dx of: possible bronchospasm as the etiology of his respiratory failure.  ENT eval 8/12: "Fiberoptic exam reveals some bluish material in his nose streaking down the floor. It looks like a dye. Patient states he coughed on a-colored medication that may be what it is. The nasopharynx is clear. The base of tongue is normal. Epiglottis is normal. His vocal cords are somewhat thickened and they do not abduct fully. He doesn't have any stridor. The vocal cords do appear to move normally. They seem to be tight. There is a definite fairly significant amount of interarytenoid and arytenoid edema. This is not watery angioedema like appearance just thickening more like chronic reflux. Piriforms look to be clear." ENT dx of  "Vocal cord dysfunction/laryngeal  pharyngeal reflux/dysphagia-the appearance of his vocal cords do not explain why he is not able to swallow liquids. The vocal cords are actually more closed than they should be and look to adduct normally. They are narrowed with regard to the abduction so I can see why he might have a little bit of difficulty with increased activity. They do fluctuate as I was watching his vocal cords meaning they do open up more than when he is concentrating on the exam. This is likely vocal cord spasm. I think that a increased more aggressive reflux regimen is more than appropriate with twice a day proton pump inhibitor and 300 mg of Zantac at night. A benzodiazepine may also be beneficial for relaxation if possible." SLP eval ordered.  Subjective: pleasant, participatory Assessment / Plan / Recommendation CHL IP CLINICAL IMPRESSIONS 10/12/2015 Therapy Diagnosis Mild pharyngeal phase dysphagia Clinical Impression Pt presents with consistent and moderate aspiration of thin liquids before and during the swallow response due to inadequate laryngeal protection.  Oral function is WNL; there is sufficient laryngeal elevation and epiglottic deflection; there is no pharyngeal residue post-swallow.  Postural adjustments did not protect the airway during thin liquid consumption, but a chin tuck eliminated aspiration of nectar-thick liquids.  For now, continue current diet (regular solids, nectar-thick liquids).  Pt and clinician reviewed MBS video and discussed results/recs; pt agrees.  SLP will follow and determine follow-up needs after D/C.  Impact on safety and function Moderate aspiration risk   CHL IP TREATMENT RECOMMENDATION 10/12/2015 Treatment Recommendations Therapy as outlined in treatment plan below   Prognosis 10/12/2015 Prognosis for Safe Diet Advancement Good Barriers to Reach Goals -- Barriers/Prognosis Comment -- CHL IP DIET RECOMMENDATION 10/12/2015 SLP Diet Recommendations Regular solids;Nectar thick liquid Liquid  Administration via Cup;Straw Medication Administration Whole meds with puree Compensations Chin tuck Postural Changes Seated upright at 90 degrees   CHL IP OTHER RECOMMENDATIONS 10/12/2015 Recommended Consults -- Oral Care Recommendations Oral care BID Other Recommendations Prohibited food (jello, ice cream, thin soups);Remove water pitcher   CHL IP FOLLOW UP RECOMMENDATIONS 10/12/2015 Follow up Recommendations (No Data)   CHL IP FREQUENCY AND DURATION 10/12/2015 Speech Therapy Frequency (ACUTE ONLY) min 2x/week Treatment Duration 1 week      CHL IP ORAL PHASE 10/12/2015 Oral Phase WFL Oral - Pudding Teaspoon -- Oral - Pudding Cup -- Oral - Honey Teaspoon -- Oral - Honey Cup -- Oral - Nectar Teaspoon -- Oral - Nectar Cup -- Oral - Nectar Straw -- Oral - Thin Teaspoon -- Oral - Thin Cup -- Oral - Thin Straw -- Oral - Puree -- Oral - Mech Soft -- Oral - Regular --  Oral - Multi-Consistency -- Oral - Pill -- Oral Phase - Comment --  CHL IP PHARYNGEAL PHASE 10/12/2015 Pharyngeal Phase Impaired Pharyngeal- Pudding Teaspoon -- Pharyngeal -- Pharyngeal- Pudding Cup -- Pharyngeal -- Pharyngeal- Honey Teaspoon -- Pharyngeal -- Pharyngeal- Honey Cup -- Pharyngeal -- Pharyngeal- Nectar Teaspoon -- Pharyngeal -- Pharyngeal- Nectar Cup Reduced airway/laryngeal closure;Penetration/Aspiration before swallow Pharyngeal Material enters airway, CONTACTS cords and then ejected out Pharyngeal- Nectar Straw -- Pharyngeal -- Pharyngeal- Thin Teaspoon -- Pharyngeal -- Pharyngeal- Thin Cup Penetration/Aspiration before swallow;Reduced airway/laryngeal closure;Moderate aspiration Pharyngeal Material enters airway, passes BELOW cords and not ejected out despite cough attempt by patient Pharyngeal- Thin Straw -- Pharyngeal -- Pharyngeal- Puree WFL Pharyngeal -- Pharyngeal- Mechanical Soft -- Pharyngeal -- Pharyngeal- Regular -- Pharyngeal -- Pharyngeal- Multi-consistency -- Pharyngeal -- Pharyngeal- Pill -- Pharyngeal -- Pharyngeal Comment --  CHL  IP CERVICAL ESOPHAGEAL PHASE 10/12/2015 Cervical Esophageal Phase WFL Pudding Teaspoon -- Pudding Cup -- Honey Teaspoon -- Honey Cup -- Nectar Teaspoon -- Nectar Cup -- Nectar Straw -- Thin Teaspoon -- Thin Cup -- Thin Straw -- Puree -- Mechanical Soft -- Regular -- Multi-consistency -- Pill -- Cervical Esophageal Comment -- CHL IP GO 08/07/2013 Functional Assessment Tool Used clinical judgement Functional Limitations Motor speech Swallow Current Status [phone]) (None) Swallow Goal Status (P3825) (None) Swallow Discharge Status (K5397) (None) Motor Speech Current Status (Q7341) CK Motor Speech Goal Status (P3790) CJ Motor Speech Goal Status (W4097) (None) Spoken Language Comprehension Current Status (D5329) (None) Spoken Language Comprehension Goal Status (J2426) (None) Spoken Language Comprehension Discharge Status (S3419) (None) Spoken Language Expression Current Status (Q2229) (None) Spoken Language Expression Goal Status (N9892) (None) Spoken Language Expression Discharge Status (J1941) (None) Attention Current Status (D4081) (None) Attention Goal Status (K4818) (None) Attention Discharge Status (H6314) (None) Memory Current Status (H7026) (None) Memory Goal Status (V7858) (None) Memory Discharge Status (I5027) (None) Voice Current Status (X4128) (None) Voice Goal Status (N8676) (None) Voice Discharge Status (H2094) (None) Other Speech-Language Pathology Functional Limitation (B0962) (None) Other Speech-Language Pathology Functional Limitation Goal Status (E3662) (None) Other Speech-Language Pathology Functional Limitation Discharge Status [phone]) (None) Juan Quam Laurice 10/12/2015, 10:07 AM  Estill Bamberg L. [place], [place] CCC/SLP Pager [phone]              Microbiology: Recent Results (from the past 240 hour(s))  Rapid strep screen     Status: None   Collection Time: 10/04/15  4:44 PM  Result Value Ref Range Status   Streptococcus, Group A Screen (Direct) NEGATIVE NEGATIVE Final    Comment: (NOTE) A Rapid  Antigen test may result negative if the antigen level in the sample is below the detection level of this test. The FDA has not cleared this test as a stand-alone test therefore the rapid antigen negative result has reflexed to a Group A Strep culture.   Culture, group A strep     Status: None   Collection Time: 10/04/15  4:44 PM  Result Value Ref Range Status   Specimen Description THROAT  Final   Special Requests NONE Reflexed from [id number]  Final   Culture NO GROUP A STREP (S.PYOGENES) ISOLATED  Final   Report Status 10/07/2015 FINAL  Final  Respiratory Panel by PCR     Status: None   Collection Time: 10/08/15 11:18 PM  Result Value Ref Range Status   Adenovirus NOT DETECTED NOT DETECTED Final   Coronavirus 229E NOT DETECTED NOT DETECTED Final   Coronavirus HKU1 NOT DETECTED NOT DETECTED Final   Coronavirus NL63 NOT DETECTED NOT DETECTED  Final   Coronavirus OC43 NOT DETECTED NOT DETECTED Final   Metapneumovirus NOT DETECTED NOT DETECTED Final   Rhinovirus / Enterovirus NOT DETECTED NOT DETECTED Final   Influenza A NOT DETECTED NOT DETECTED Final   Influenza A H1 NOT DETECTED NOT DETECTED Final   Influenza A H1 2009 NOT DETECTED NOT DETECTED Final   Influenza A H3 NOT DETECTED NOT DETECTED Final   Influenza B NOT DETECTED NOT DETECTED Final   Parainfluenza Virus 1 NOT DETECTED NOT DETECTED Final   Parainfluenza Virus 2 NOT DETECTED NOT DETECTED Final   Parainfluenza Virus 3 NOT DETECTED NOT DETECTED Final   Parainfluenza Virus 4 NOT DETECTED NOT DETECTED Final   Respiratory Syncytial Virus NOT DETECTED NOT DETECTED Final   Bordetella pertussis NOT DETECTED NOT DETECTED Final   Chlamydophila pneumoniae NOT DETECTED NOT DETECTED Final   Mycoplasma pneumoniae NOT DETECTED NOT DETECTED Final    Comment: Performed at [facility]  MRSA PCR Screening     Status: None   Collection Time: 10/08/15 11:19 PM  Result Value Ref Range Status   MRSA by PCR NEGATIVE NEGATIVE Final     Comment:        The GeneXpert MRSA Assay (FDA approved for NASAL specimens only), is one component of a comprehensive MRSA colonization surveillance program. It is not intended to diagnose MRSA infection nor to guide or monitor treatment for MRSA infections.      Labs: Basic Metabolic Panel:  Recent Labs Lab 10/08/15 1708 10/09/15 0306 10/12/15 0300  NA 140 136 139  K 4.0 5.1 3.6  CL 102 102 100*  CO2 '31 31 30  '$ GLUCOSE 143* 224* 172*  BUN '8 9 13  '$ CREATININE 0.69 0.75 0.76  CALCIUM 8.9 8.5* 8.7*   CBC:  Recent Labs Lab 10/08/15 1708 10/09/15 0306 10/12/15 0300  WBC 7.8 9.1 11.7*  NEUTROABS 5.5  --  7.2  HGB 13.8 13.8 12.9*  HCT 40.7 41.8 38.8*  MCV 91.1 92.7 92.6  PLT 194 150 176   Cardiac Enzymes:  Recent Labs Lab 10/08/15 2204 10/09/15 0306 10/09/15 0859  TROPONINI <0.03 <0.03 <0.03    Recent Labs  04/01/15 0030 10/08/15 1708  BNP 16.0 102.0*   CBG:  Recent Labs Lab 10/12/15 1537 10/12/15 1755 10/12/15 2228 10/13/15 0841 10/13/15 1228  GLUCAP 410* 385* 274* 338* 345*    Principal Problem:   Acute respiratory failure with hypoxia (HCC) Active Problems:   OSA (obstructive sleep apnea)   TIA (transient ischemic attack)   Gastroesophageal reflux disease with esophagitis   Asthma exacerbation   Diabetes mellitus type II, controlled ([place])   Morbid obesity ([place])   Tobacco use disorder   Essential hypertension   Major depressive disorder, recurrent severe without psychotic features ([place])   Respiratory failure with hypoxia ([place])   Wound infection-right leg   Hoarse voice quality   Time coordinating discharge: 50 minutes  Signed:  Murray Hodgkins, MD [facility] 10/13/2015, 4:51 PM

## 2015-10-13 NOTE — Progress Notes (Signed)
PROGRESS NOTE  Bryan Gallegos UUV:[id number] DOB: Mar 22, 1975 DOA: 10/08/2015 [facility] P: No PCP Per Patient  Brief Narrative: 40 year old man with recent admission for airway compromise secondary to uvular enlargement, likely allergic reaction, required intubation at that time. Presented again 8/9 with cough, shortness of breath, pleuritic chest pain. Admitted for acute hypoxic respiratory failure of unclear etiology, pleuritic chest pain. Because of ongoing symptoms with cough, dyspnea and vocal hoarseness he was seen by pulmonology who recommended ENT consultation. Notably noncompliant with treatment, ambulated off the unit several times to smoke. 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 Recommendations for her for GERD therapy. Seen by speech pathology with recommendation for regular solids, nectar thick liquids with chin tuck.  Assessment/Plan: 1. Acute hypoxic, hypercapnic respiratory failure. Resolved. Etiology unclear. Thought secondary to vocal cord dysfunction. Does not meet requirement for oxygen. 2. Cough, vocal cord dysfunction/spasm, laryngeal pharyngeal reflux, dyspnea thought to be multifactorial including difficult intubation superimposed on severe obstructive sleep apnea and possible OHS. Continue treatment for GERD with PPI twice a day and Zantac at night.  3. Pleuritic chest pain. CT angiogram chest negative. No further evaluation suggested. Secondary to cough. 4. Diabetes mellitus, , Fair control while on steroids. Anticipate will need insulin for short period of time. However steroid taper recommended by pulmonology. 5. Tobacco use disorder 6. Severe obstructive sleep apnea 7. Morbid obesity 8. Bipolar disorder, ADHD 9. Wound infection right calf started on oral antibiotics. Mupirocin ointment to area twice daily, with non-adherent gauze placed   Per pulmonology qualifies for  Trilogy vent at home which they have arranged   No ACE inhibitors for life.  Home today on steroid taper, long-acting insulin. Patient will be educated prior to discharge by nurse. He also has experienced with insulin administration in regard to his mother and his wife as a [employer].  Diet per speech therapy  FEEs swallow assessment as outpatient   Murray Hodgkins, MD  [facility] Direct contact: [phone] --Via amion app OR  --[email or web address]; password [email or web address]  7PM-7AM contact night coverage as above 10/13/2015, 2:47 PM  LOS: 3 days   Consultants:  pulmonology  Procedures:  none   HPI/Subjective: Overall doing better. Still coughing.  Objective: Vitals:   10/12/15 2218 10/13/15 0608 10/13/15 1006 10/13/15 1311  BP: (!) 138/57 (!) 144/67  (!) 138/57  Pulse: 95 79  85  Resp:    20  Temp:    98.1 F (36.7 C)  TempSrc:    Oral  SpO2: 96% 98% 100% 100%  Weight:      Height:        Intake/Output Summary (Last 24 hours) at 10/13/15 1447 Last data filed at 10/13/15 1418  Gross per 24 hour  Intake              700 ml  Output                0 ml  Net              700 ml     Filed Weights   10/08/15 1647  Weight: (!) 171.9 kg (379 lb)    Exam:    Constitutional:  . Appears calm and comfortable Respiratory:  . CTA bilaterally, no w/r/r.  . Respiratory effort normal. No retractions or accessory muscle use Cardiovascular:  . RRR, no m/r/g . No LE extremity edema   Psychiatric:  . judgement and  insight appear normal . Mental status o Mood, affect appropriate  I have personally reviewed following labs and imaging studies:  Blood sugars 200-400  No other labs  Scheduled Meds: . amphetamine-dextroamphetamine  30 mg Oral BID  . aspirin  325 mg Oral Daily  . citalopram  20 mg Oral Daily  . clonazePAM  1 mg Oral BID  . dextromethorphan-guaiFENesin  2 tablet Oral BID  . divalproex  500 mg Oral BID  . doxycycline  100 mg Oral Q12H  . enoxaparin  (LOVENOX) injection  0.5 mg/kg Subcutaneous Q24H  . famotidine  20 mg Oral QHS  . fluticasone  1 spray Each Nare Daily  . furosemide  20 mg Oral Daily  . gabapentin  800 mg Oral TID  . hydrOXYzine  50 mg Oral TID  . insulin aspart  0-20 Units Subcutaneous TID WC  . insulin aspart  0-5 Units Subcutaneous QHS  . insulin aspart  3 Units Subcutaneous TID WC  . insulin glargine  30 Units Subcutaneous QHS  . insulin starter kit- syringes  1 kit Other Once  . ipratropium-albuterol  3 mL Nebulization BID  . living well with diabetes book   Does not apply Once  . methylPREDNISolone (SOLU-MEDROL) injection  40 mg Intravenous Q12H  . mupirocin ointment   Nasal BID  . nicotine  21 mg Transdermal Daily  . pantoprazole  40 mg Oral BID AC  . sodium chloride  2 spray Each Nare BID  . sodium chloride flush  3 mL Intravenous Q12H  . traZODone  150 mg Oral QHS   Continuous Infusions:   Principal Problem:   Acute respiratory failure with hypoxia (HCC) Active Problems:   OSA (obstructive sleep apnea)   TIA (transient ischemic attack)   Gastroesophageal reflux disease with esophagitis   Asthma exacerbation   Diabetes mellitus type II, controlled ([place])   Morbid obesity ([place])   Tobacco use disorder   Essential hypertension   Major depressive disorder, recurrent severe without psychotic features ([place])   Respiratory failure with hypoxia ([place])   Wound infection-right leg   Hoarse voice quality   LOS: 3 days

## 2015-10-13 NOTE — Progress Notes (Signed)
Md called to verify discharge order and MD stated taht he will come check on the patient and check on discharge.

## 2015-10-14 ENCOUNTER — Encounter (HOSPITAL_COMMUNITY): Payer: Self-pay | Admitting: *Deleted

## 2015-10-14 ENCOUNTER — Emergency Department (HOSPITAL_COMMUNITY): Payer: Medicare Other

## 2015-10-14 ENCOUNTER — Emergency Department (HOSPITAL_COMMUNITY)
Admission: EM | Admit: 2015-10-14 | Discharge: 2015-10-15 | Disposition: A | Discharge status: Home / Self Care | Payer: Medicare Other | Attending: Emergency Medicine | Admitting: Emergency Medicine

## 2015-10-14 DIAGNOSIS — Z791 Long term (current) use of non-steroidal anti-inflammatories (NSAID): Secondary | ICD-10-CM | POA: Insufficient documentation

## 2015-10-14 DIAGNOSIS — E119 Type 2 diabetes mellitus without complications: Secondary | ICD-10-CM | POA: Insufficient documentation

## 2015-10-14 DIAGNOSIS — F17210 Nicotine dependence, cigarettes, uncomplicated: Secondary | ICD-10-CM | POA: Insufficient documentation

## 2015-10-14 DIAGNOSIS — R112 Nausea with vomiting, unspecified: Secondary | ICD-10-CM | POA: Insufficient documentation

## 2015-10-14 DIAGNOSIS — R1011 Right upper quadrant pain: Secondary | ICD-10-CM | POA: Insufficient documentation

## 2015-10-14 DIAGNOSIS — J45909 Unspecified asthma, uncomplicated: Secondary | ICD-10-CM | POA: Diagnosis not present

## 2015-10-14 DIAGNOSIS — Z79899 Other long term (current) drug therapy: Secondary | ICD-10-CM | POA: Insufficient documentation

## 2015-10-14 DIAGNOSIS — I10 Essential (primary) hypertension: Secondary | ICD-10-CM | POA: Diagnosis not present

## 2015-10-14 DIAGNOSIS — R197 Diarrhea, unspecified: Secondary | ICD-10-CM | POA: Insufficient documentation

## 2015-10-14 DIAGNOSIS — F909 Attention-deficit hyperactivity disorder, unspecified type: Secondary | ICD-10-CM | POA: Diagnosis not present

## 2015-10-14 DIAGNOSIS — R109 Unspecified abdominal pain: Secondary | ICD-10-CM

## 2015-10-14 DIAGNOSIS — Z7982 Long term (current) use of aspirin: Secondary | ICD-10-CM | POA: Insufficient documentation

## 2015-10-14 LAB — URINALYSIS, ROUTINE W REFLEX MICROSCOPIC
Bilirubin Urine: NEGATIVE
GLUCOSE, UA: NEGATIVE mg/dL
HGB URINE DIPSTICK: NEGATIVE
KETONES UR: NEGATIVE mg/dL
LEUKOCYTES UA: NEGATIVE
Nitrite: NEGATIVE
PH: 8 (ref 5.0–8.0)
Protein, ur: NEGATIVE mg/dL
Specific Gravity, Urine: 1.012 (ref 1.005–1.030)

## 2015-10-14 LAB — COMPREHENSIVE METABOLIC PANEL
ALBUMIN: 3.6 g/dL (ref 3.5–5.0)
ALT: 57 U/L (ref 17–63)
AST: 31 U/L (ref 15–41)
Alkaline Phosphatase: 50 U/L (ref 38–126)
Anion gap: 6 (ref 5–15)
BUN: 12 mg/dL (ref 6–20)
CHLORIDE: 97 mmol/L — AB (ref 101–111)
CO2: 33 mmol/L — AB (ref 22–32)
CREATININE: 0.8 mg/dL (ref 0.61–1.24)
Calcium: 9 mg/dL (ref 8.9–10.3)
GFR calc Af Amer: >60 mL/min (ref >60)
GFR calc non Af Amer: >60 mL/min (ref >60)
GLUCOSE: 156 mg/dL — AB (ref 65–99)
POTASSIUM: 4.2 mmol/L (ref 3.5–5.1)
Sodium: 136 mmol/L (ref 135–145)
Total Bilirubin: 1 mg/dL (ref 0.3–1.2)
Total Protein: 7.6 g/dL (ref 6.5–8.1)

## 2015-10-14 LAB — CBC
HEMATOCRIT: 45.7 % (ref 39.0–52.0)
Hemoglobin: 15.5 g/dL (ref 13.0–17.0)
MCH: 30.6 pg (ref 26.0–34.0)
MCHC: 33.9 g/dL (ref 30.0–36.0)
MCV: 90.3 fL (ref 78.0–100.0)
PLATELETS: 245 10*3/uL (ref 150–400)
RBC: 5.06 MIL/uL (ref 4.22–5.81)
RDW: 13.6 % (ref 11.5–15.5)
WBC: 13.4 10*3/uL — AB (ref 4.0–10.5)

## 2015-10-14 LAB — LIPASE, BLOOD: Lipase: 27 U/L (ref 11–51)

## 2015-10-14 MED ORDER — SODIUM CHLORIDE 0.9 % IV BOLUS (SEPSIS)
500.0000 mL | Freq: Once | INTRAVENOUS | Status: AC
  Administered 2015-10-14: 500 mL via INTRAVENOUS

## 2015-10-14 MED ORDER — IOPAMIDOL (ISOVUE-300) INJECTION 61%
100.0000 mL | Freq: Once | INTRAVENOUS | Status: AC | PRN
  Administered 2015-10-14: 100 mL via INTRAVENOUS

## 2015-10-14 MED ORDER — MORPHINE SULFATE (PF) 4 MG/ML IV SOLN
4.0000 mg | Freq: Once | INTRAVENOUS | Status: AC
  Administered 2015-10-14: 4 mg via INTRAVENOUS
  Filled 2015-10-14: qty 1

## 2015-10-14 MED ORDER — DIATRIZOATE MEGLUMINE & SODIUM 66-10 % PO SOLN: 15.0000 mL | Freq: Once | ORAL | Status: DC

## 2015-10-14 MED ORDER — ONDANSETRON HCL 4 MG/2ML IJ SOLN
4.0000 mg | Freq: Once | INTRAMUSCULAR | Status: AC
  Administered 2015-10-14: 4 mg via INTRAVENOUS
  Filled 2015-10-14: qty 2

## 2015-10-14 NOTE — ED Provider Notes (Addendum)
[facility] DEPT Provider Note   CSN: [id number] Arrival date & time: 10/14/15  1832     History   Chief Complaint Chief Complaint  Patient presents with  . Abdominal Pain    HPI Bryan Gallegos is a 40 y.o. male.  The history is provided by the patient.  Abdominal Pain   This is a new problem. The current episode started 12 to 24 hours ago. The problem occurs constantly. The problem has not changed since onset.The pain is associated with eating. The pain is located in the RUQ. The pain is moderate. Associated symptoms include diarrhea, nausea and vomiting. Pertinent negatives include anorexia, fever, constipation, dysuria, frequency and headaches. The symptoms are aggravated by eating. Nothing relieves the symptoms.    Past Medical History:  Diagnosis Date  . Adult ADHD   . Anaphylactic reaction   . Anxiety   . Asthma   . Bipolar disorder ([place])   . Complication of anesthesia    Per patient difficult intubation;  . Diabetes mellitus   . Difficult intubation    Per patient  . Heart valve disorder    s/p echocardiogram  . Hyperlipidemia   . Hypertension   . Morbidly obese ([place])   . OSA (obstructive sleep apnea)   . Transient cerebral ischemia    Unknown    Patient Active Problem List   Diagnosis Date Noted  . Hoarse voice quality 10/11/2015  . Acute respiratory failure with hypoxia ([place]) 10/08/2015  . Respiratory failure with hypoxia ([place]) 10/08/2015  . Wound infection-right leg 10/08/2015  . Endotracheally intubated   . Uvular swelling 09/28/2015  . Acute respiratory failure ([place]) 09/28/2015  . OD (overdose of drug) 09/14/2015  . Major depressive disorder, recurrent severe without psychotic features ([place]) 09/04/2015  . Polysubstance dependence including opioid type drug, episodic abuse ([place]) 09/04/2015  . Left-sided weakness 08/21/2015  . Vomiting 08/21/2015  . Epigastric pain   . RUQ abdominal pain   . Intractable vomiting 05/17/2015  . Essential  hypertension 05/17/2015  . Abdominal pain 05/17/2015  . Cholelithiasis 05/17/2015  . Malingering 04/11/2015  . Antisocial personality disorder 04/11/2015  . Opioid use disorder, severe, dependence ([place]) 04/11/2015  . Benzodiazepine dependence ([place]) 04/11/2015  . Opioid overdose 04/11/2015  . Overdose 04/11/2015  . 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 Altered mental status   . Hypoxic brain injury ([place])   . HTN (hypertension) 04/10/2015  . Tobacco use disorder 04/10/2015  . Narcotic dependency, continuous ([place]) 03/14/2014  . Chronic back pain greater than 3 months duration 01/28/2014  . Gastroesophageal reflux disease with esophagitis 01/28/2014  . Essential hypertension, benign 01/28/2014  . Asthma exacerbation 01/28/2014  . Diabetes mellitus type II, controlled ([place]) 01/28/2014  . Cellulitis of leg, right 01/28/2014  . Morbid obesity ([place]) 01/28/2014  . TIA (transient ischemic attack) 09/02/2013  . HLD (hyperlipidemia) 08/06/2013  . OSA (obstructive sleep apnea) 08/06/2013    Past Surgical History:  Procedure Laterality Date  . CARPAL TUNNEL RELEASE    . ESOPHAGOGASTRODUODENOSCOPY N/A 04/05/2015   Procedure: ESOPHAGOGASTRODUODENOSCOPY (EGD);  Surgeon: Rogene Houston, MD;  Location: [facility];  Service: Endoscopy;  Laterality: N/A;  . ESOPHAGOGASTRODUODENOSCOPY (EGD) WITH PROPOFOL N/A 05/21/2015   Procedure: ESOPHAGOGASTRODUODENOSCOPY (EGD) WITH PROPOFOL;  Surgeon: Ladene Artist, MD;  Location: [facility] ENDOSCOPY;  Service: Endoscopy;  Laterality: N/A;  . NOSE SURGERY    . TOOTH EXTRACTION    . [place] EXTRACTION         Home Medications  Prior to Admission medications   Medication Sig Start Date End Date Taking? Authorizing Provider  alprazolam Prudy Feeler) 2 MG tablet Take 2 mg by mouth 3 (three) times daily as needed for sleep or anxiety.  09/09/15  Yes Historical Provider, MD  amphetamine-dextroamphetamine (ADDERALL) 30 MG tablet Take 30 mg by mouth 2  (two) times daily.    Yes Historical Provider, MD  aspirin 325 MG tablet Take 1 tablet (325 mg total) by mouth daily. For heart health 09/05/15  Yes Sanjuana Kava, NP  citalopram (CELEXA) 20 MG tablet Take 20 mg by mouth every morning. 10/02/15  Yes Historical Provider, MD  divalproex (DEPAKOTE) 500 MG DR tablet Take 500 mg by mouth 2 (two) times daily. 10/02/15  Yes Historical Provider, MD  furosemide (LASIX) 20 MG tablet Take 1 tablet (20 mg total) by mouth daily. For swelling 09/05/15  Yes Sanjuana Kava, NP  gabapentin (NEURONTIN) 400 MG capsule Take 2 capsules (800 mg total) by mouth 3 (three) times daily. 10/13/15  Yes Standley Brooking, MD  ibuprofen (ADVIL,MOTRIN) 200 MG tablet Take 400-600 mg by mouth every 6 (six) hours as needed for mild pain or moderate pain.   Yes Historical Provider, MD  metFORMIN (GLUCOPHAGE) 1000 MG tablet Take 1,000 mg by mouth 2 (two) times daily. 09/09/15  Yes Historical Provider, MD  oxycodone (ROXICODONE) 30 MG immediate release tablet Take 30 mg by mouth every 4 (four) hours as needed for pain.   Yes Historical Provider, MD  traZODone (DESYREL) 50 MG tablet Take 1 tablet (50 mg total) by mouth at bedtime. For sleep Patient taking differently: Take 150 mg by mouth at bedtime. For sleep 09/05/15  Yes Sanjuana Kava, NP  acetaminophen (TYLENOL) 500 MG tablet Take 2 tablets (1,000 mg total) by mouth every 8 (eight) hours. Do not take more than 4000 mg of acetaminophen (Tylenol) in a 24-hour period. Please note that other medicines that you may be prescribed may have Tylenol as well. 10/15/15 10/20/15  Nira Conn, MD  albuterol (PROVENTIL HFA;VENTOLIN HFA) 108 (90 Base) MCG/ACT inhaler Inhale 2 puffs into the lungs every 2 (two) hours as needed for wheezing or shortness of breath. 10/13/15   Standley Brooking, MD  blood glucose meter kit and supplies KIT Dispense based on patient and insurance preference. Use up to four times daily as directed. (FOR ICD-9 250.00, 250.01).  10/13/15   Standley Brooking, MD  insulin glargine (LANTUS) 100 UNIT/ML injection Inject 0.2 mLs (20 Units total) into the skin at bedtime. 10/13/15   Standley Brooking, MD  methylPREDNISolone (MEDROL) 8 MG tablet 24 mg x 3 days, then 16 mg x3 days, then 8 mg x3 days, then stop. 10/13/15   Standley Brooking, MD  nicotine (NICODERM CQ - DOSED IN MG/24 HOURS) 21 mg/24hr patch Place 1 patch (21 mg total) onto the skin daily. 10/11/15   Haydee Salter, MD  omeprazole (PRILOSEC) 40 MG capsule Take 1 capsule (40 mg total) by mouth 2 (two) times daily. 10/13/15   Standley Brooking, MD  ondansetron (ZOFRAN) 4 MG tablet Take 1 tablet (4 mg total) by mouth every 6 (six) hours. 10/15/15   Nira Conn, MD    Family History Family History  Problem Relation Age of Onset  . CAD Mother     Living  . Diabetes Mellitus II Mother   . Stroke Mother   . Hypertension Mother   . Congestive Heart Failure Mother   .  Kidney disease Mother   . Fibromyalgia Mother   . Thyroid disease Mother   . Hyperlipidemia Mother   . Liver disease Mother   . Alcoholism Father 60    Deceased  . Arthritis Maternal Grandmother   . Congestive Heart Failure Maternal Grandmother   . Hypertension Maternal Grandmother   . Lung cancer Maternal Grandfather   . Colon cancer Maternal Aunt   . Stomach cancer Maternal Aunt   . Heart disease Other     Paternal & Maternal  . Crohn's disease Other   . Hypertension Other     Paternal & Maternal  . Hypertension Brother     x3  . Hypertension Sister     #1  . Bipolar disorder Sister     #1  . ADD / ADHD Son     x3  . Bipolar disorder Son     x3  . Asperger's syndrome Son     Social History Social History  Substance Use Topics  . Smoking status: Current Every Day Smoker    Packs/day: 0.50    Years: 23.00    Types: Cigarettes  . Smokeless tobacco: Never Used  . Alcohol use No     Allergies   Atenolol; Lisinopril; Prednisone; and Tylenol [acetaminophen]   Review  of Systems Review of Systems  Constitutional: Negative for appetite change, chills, fatigue and fever.  HENT: Negative for congestion, ear pain, facial swelling, mouth sores and sore throat.   Eyes: Negative for visual disturbance.  Respiratory: Negative for cough, chest tightness and shortness of breath.   Cardiovascular: Negative for chest pain and palpitations.  Gastrointestinal: Positive for abdominal pain, diarrhea, nausea and vomiting. Negative for anorexia, blood in stool and constipation.  Endocrine: Negative for cold intolerance and heat intolerance.  Genitourinary: Negative for decreased urine volume, difficulty urinating, dysuria and frequency.  Musculoskeletal: Negative for back pain and neck stiffness.  Skin: Negative for rash.  Neurological: Negative for dizziness, weakness, light-headedness and headaches.     Physical Exam Updated Vital Signs BP (!) 115/52 (BP Location: Right Arm)   Pulse 93   Temp 99.5 F (37.5 C) (Oral)   Resp 22   Ht '6\' 2"'$  (1.88 m)   Wt (!) 370 lb (167.8 kg)   SpO2 97%   BMI 47.51 kg/m   Physical Exam  Constitutional: He is oriented to person, place, and time. He appears well-nourished. No distress.  obese  HENT:  Head: Normocephalic and atraumatic.  Right Ear: External ear normal.  Left Ear: External ear normal.  Eyes: Pupils are equal, round, and reactive to light. Right eye exhibits no discharge. Left eye exhibits no discharge. No scleral icterus.  Neck: Normal range of motion. Neck supple.  Cardiovascular: Normal rate.  Exam reveals no gallop and no friction rub.   No murmur heard. Pulmonary/Chest: Effort normal and breath sounds normal. No stridor. No respiratory distress. He has no wheezes. He has no rales. He exhibits no tenderness.  Abdominal: Soft. He exhibits no distension and no mass. There is tenderness in the right upper quadrant and right lower quadrant. There is no rigidity, no rebound, no guarding, no CVA tenderness and  negative Murphy's sign.    Musculoskeletal: He exhibits no edema or tenderness.  Neurological: He is alert and oriented to person, place, and time.  Skin: Skin is warm and dry. No rash noted. He is not diaphoretic.     ED Treatments / Results  Labs (all labs ordered are listed, but only  abnormal results are displayed) Labs Reviewed  COMPREHENSIVE METABOLIC PANEL - Abnormal; Notable for the following:       Result Value   Chloride 97 (*)    CO2 33 (*)    Glucose, Bld 156 (*)    All other components within normal limits  CBC - Abnormal; Notable for the following:    WBC 13.4 (*)    All other components within normal limits  LIPASE, BLOOD  URINALYSIS, ROUTINE W REFLEX MICROSCOPIC (NOT AT [facility])    EKG  EKG Interpretation None       Radiology Ct Abdomen Pelvis W Contrast  Result Date: 10/14/2015 CLINICAL DATA:  Acute onset of generalized abdominal pain. Initial encounter. EXAM: CT ABDOMEN AND PELVIS WITH CONTRAST TECHNIQUE: Multidetector CT imaging of the abdomen and pelvis was performed using the standard protocol following bolus administration of intravenous contrast. CONTRAST:  ISOVUE-300 IOPAMIDOL (ISOVUE-300) INJECTION 61% COMPARISON:  Abdominal ultrasound performed 05/17/2015, and CT of the abdomen and pelvis from 05/16/2015 FINDINGS: The visualized lung bases are clear. The liver and spleen are unremarkable in appearance. The gallbladder is within normal limits. The pancreas and adrenal glands are unremarkable. The kidneys are unremarkable in appearance. There is no evidence of hydronephrosis. No renal or ureteral stones are seen. Mild nonspecific perinephric stranding is noted bilaterally. No free fluid is identified. The small bowel is unremarkable in appearance. The stomach is within normal limits. No acute vascular abnormalities are seen. The appendix is normal in caliber, without evidence of appendicitis. The colon is grossly unremarkable. The bladder is mildly  distended and grossly unremarkable. The prostate remains normal in size. No inguinal lymphadenopathy is seen. No acute osseous abnormalities are identified. IMPRESSION: Unremarkable contrast-enhanced CT of the abdomen and pelvis. Electronically Signed   By: Roanna Raider M.D.   On: 10/14/2015 22:19   US Abdomen Limited Ruq  Result Date: 10/15/2015 CLINICAL DATA:  40 y/o  M; abdominal pain and history of gallstone. EXAM: US ABDOMEN LIMITED - RIGHT UPPER QUADRANT COMPARISON:  CT of abdomen and pelvis dated 10/14/2015. Abdominal sonogram dated 05/17/2015. FINDINGS: Gallbladder: Single 1 cm calculus without significant interval change from prior ultrasound. No gallbladder wall thickening, pericholecystic fluid, or gallbladder enlargement. Negative sonographic Murphy's sign. Common bile duct: Diameter: 8 mm proximally, 6 mm distally, within normal limits. Liver: No focal lesion identified. Within normal limits in parenchymal echogenicity. IMPRESSION: Single unchanged gallstone.  No signs of acute cholecystitis. Electronically Signed   By: Mitzi Hansen M.D.   On: 10/15/2015 00:20    Procedures Procedures (including critical care time)  Medications Ordered in ED Medications  diatrizoate meglumine-sodium (GASTROGRAFIN) 66-10 % solution 15 mL (not administered)  ondansetron (ZOFRAN) injection 4 mg (4 mg Intravenous Given 10/14/15 2042)  morphine 4 MG/ML injection 4 mg (4 mg Intravenous Given 10/14/15 2041)  sodium chloride 0.9 % bolus 500 mL (0 mLs Intravenous Stopped 10/14/15 2219)  iopamidol (ISOVUE-300) 61 % injection 100 mL (100 mLs Intravenous Contrast Given 10/14/15 2147)  morphine 4 MG/ML injection 4 mg (4 mg Intravenous Given 10/14/15 2233)     Initial Impression / Assessment and Plan / ED Course  I have reviewed the triage vital signs and the nursing notes.  Pertinent labs & imaging results that were available during my care of the patient were reviewed by me and considered in my  medical decision making (see chart for details).  Clinical Course   Right-sided abdominal pain started yesterday with associated nausea vomiting and diarrhea. Patient with a  history of cholelithiasis. Exacerbated with eating. Presentation concerning for either cholecystitis, appendicitis, colitis.  Of note patient was just discharged from the hospital for vocal cord dysfunction. However patient is not complaining of any shortness of breath or difficulty breathing.  We'll obtain screening labs and CT scan to rule out intra-abdominal infection.  CT of the abdomen without any evidence of appendicitis, diverticulitis, small bowel obstruction, colitis. Labs revealed leukocytosis. We'll obtain right upper quadrant ultrasound to assess for cholecystitis given the history of cholelithiasis.  Ultrasound reveals stable cholelithiasis without evidence of cholecystitis.  Patient provided with symptomatic pain and nausea medication as well as IV fluids.  Patient safe for discharge with strict return precautions. Patient is to follow-up with general surgery for evaluation of possible cholecystectomy given his symptomatic cholelithiasis.   Final Clinical Impressions(s) / ED Diagnoses   Final diagnoses:  Abdominal pain  Right upper quadrant pain  Nausea vomiting and diarrhea   Disposition: Discharge  Condition: Good  I have discussed the results, Dx and Tx plan with the patient who expressed understanding and agree(s) with the plan. Discharge instructions discussed at great length. The patient was given strict return precautions who verbalized understanding of the instructions. No further questions at time of discharge.    New Prescriptions   ACETAMINOPHEN (TYLENOL) 500 MG TABLET    Take 2 tablets (1,000 mg total) by mouth every 8 (eight) hours. Do not take more than 4000 mg of acetaminophen (Tylenol) in a 24-hour period. Please note that other medicines that you may be prescribed may have Tylenol  as well.   ONDANSETRON (ZOFRAN) 4 MG TABLET    Take 1 tablet (4 mg total) by mouth every 6 (six) hours.    Follow Up: Excell Seltzer, MD [place] [id number] [phone]  Schedule an appointment as soon as possible for a visit  For close follow up to assess for symptomatic gall bladder stones      Fatima Blank, MD 10/15/15 [phone]

## 2015-10-14 NOTE — ED Notes (Signed)
Bed: WTR6 Expected date:  Expected time:  Means of arrival:  Comments: 

## 2015-10-14 NOTE — ED Notes (Signed)
Called and talked to CT to get thicker liquid for patient due to he is on a nectar thick liquid per discharge notes 10/13/2015

## 2015-10-14 NOTE — ED Notes (Signed)
Pt returned from CT °

## 2015-10-14 NOTE — ED Triage Notes (Signed)
Pt from home, reports he was just dc from the hospital yesterday, was given rxs for steroids and other meds which he did not get filled because he cannot afford them   He states it's about $1,000.  Pt does not have any shoes on.  Pt came in last Wednesday for same.  Pt is A&O x 4.  Pt is also reporting abd pain.  In NAD

## 2015-10-15 ENCOUNTER — Ambulatory Visit (HOSPITAL_BASED_OUTPATIENT_CLINIC_OR_DEPARTMENT_OTHER): Payer: Medicare Other | Admitting: Critical Care Medicine

## 2015-10-15 ENCOUNTER — Encounter: Payer: Self-pay | Admitting: Critical Care Medicine

## 2015-10-15 VITALS — BP 106/66 | HR 74 | Temp 98.4°F | Resp 18 | Ht 74.0 in | Wt 381.8 lb

## 2015-10-15 DIAGNOSIS — E119 Type 2 diabetes mellitus without complications: Secondary | ICD-10-CM | POA: Diagnosis not present

## 2015-10-15 DIAGNOSIS — K2100 Gastro-esophageal reflux disease with esophagitis, without bleeding: Secondary | ICD-10-CM

## 2015-10-15 DIAGNOSIS — F1120 Opioid dependence, uncomplicated: Secondary | ICD-10-CM | POA: Insufficient documentation

## 2015-10-15 DIAGNOSIS — K219 Gastro-esophageal reflux disease without esophagitis: Secondary | ICD-10-CM | POA: Insufficient documentation

## 2015-10-15 DIAGNOSIS — J4530 Mild persistent asthma, uncomplicated: Secondary | ICD-10-CM | POA: Diagnosis not present

## 2015-10-15 DIAGNOSIS — J9621 Acute and chronic respiratory failure with hypoxia: Secondary | ICD-10-CM

## 2015-10-15 DIAGNOSIS — I639 Cerebral infarction, unspecified: Secondary | ICD-10-CM

## 2015-10-15 DIAGNOSIS — G4733 Obstructive sleep apnea (adult) (pediatric): Secondary | ICD-10-CM | POA: Diagnosis not present

## 2015-10-15 DIAGNOSIS — F17210 Nicotine dependence, cigarettes, uncomplicated: Secondary | ICD-10-CM

## 2015-10-15 DIAGNOSIS — F17200 Nicotine dependence, unspecified, uncomplicated: Secondary | ICD-10-CM | POA: Diagnosis not present

## 2015-10-15 DIAGNOSIS — Z794 Long term (current) use of insulin: Secondary | ICD-10-CM

## 2015-10-15 DIAGNOSIS — K8020 Calculus of gallbladder without cholecystitis without obstruction: Secondary | ICD-10-CM

## 2015-10-15 DIAGNOSIS — Z9989 Dependence on other enabling machines and devices: Secondary | ICD-10-CM

## 2015-10-15 DIAGNOSIS — G894 Chronic pain syndrome: Secondary | ICD-10-CM

## 2015-10-15 DIAGNOSIS — M549 Dorsalgia, unspecified: Secondary | ICD-10-CM

## 2015-10-15 DIAGNOSIS — J45901 Unspecified asthma with (acute) exacerbation: Secondary | ICD-10-CM

## 2015-10-15 DIAGNOSIS — R490 Dysphonia: Secondary | ICD-10-CM

## 2015-10-15 DIAGNOSIS — G8929 Other chronic pain: Secondary | ICD-10-CM

## 2015-10-15 LAB — GLUCOSE, POCT (MANUAL RESULT ENTRY): POC GLUCOSE: 184 mg/dL — AB (ref 70–99)

## 2015-10-15 MED ORDER — ACETAMINOPHEN 500 MG PO TABS: 1000.0000 mg | ORAL_TABLET | Freq: Three times a day (TID) | ORAL | 0 refills | Status: DC

## 2015-10-15 MED ORDER — GABAPENTIN 400 MG PO CAPS: 800.0000 mg | ORAL_CAPSULE | Freq: Three times a day (TID) | ORAL | 4 refills | Status: DC

## 2015-10-15 MED ORDER — ALBUTEROL SULFATE HFA 108 (90 BASE) MCG/ACT IN AERS: 2.0000 | INHALATION_SPRAY | RESPIRATORY_TRACT | 4 refills | Status: DC | PRN

## 2015-10-15 MED ORDER — TRAZODONE HCL 50 MG PO TABS: 50.0000 mg | ORAL_TABLET | Freq: Every day | ORAL | 6 refills | Status: DC

## 2015-10-15 MED ORDER — CITALOPRAM HYDROBROMIDE 20 MG PO TABS: 20.0000 mg | ORAL_TABLET | ORAL | 6 refills | Status: DC

## 2015-10-15 MED ORDER — INSULIN GLARGINE 100 UNIT/ML ~~LOC~~ SOLN: 20.0000 [IU] | Freq: Every day | SUBCUTANEOUS | 6 refills | Status: DC

## 2015-10-15 MED ORDER — ONDANSETRON HCL 4 MG PO TABS: 4.0000 mg | ORAL_TABLET | Freq: Four times a day (QID) | ORAL | 0 refills | Status: DC

## 2015-10-15 MED ORDER — OMEPRAZOLE 40 MG PO CPDR: 40.0000 mg | DELAYED_RELEASE_CAPSULE | Freq: Two times a day (BID) | ORAL | 6 refills | Status: DC

## 2015-10-15 MED ORDER — METFORMIN HCL 1000 MG PO TABS: 1000.0000 mg | ORAL_TABLET | Freq: Two times a day (BID) | ORAL | 6 refills | Status: DC

## 2015-10-15 MED ORDER — RANITIDINE HCL 300 MG PO TABS: 300.0000 mg | ORAL_TABLET | Freq: Every day | ORAL | 6 refills | Status: DC

## 2015-10-15 MED FILL — CITALOPRAM HBR 20 MG TABLET: 20 | 30 days supply | Qty: 30 | Fill #0

## 2015-10-15 MED FILL — VENTOLIN HFA 90 MCG INHALER: 108 (90 BAS | 28 days supply | Qty: 18 | Fill #0

## 2015-10-15 MED FILL — OMEPRAZOLE DR 40 MG CAPSULE: 40 | 30 days supply | Qty: 60 | Fill #0

## 2015-10-15 MED FILL — GABAPENTIN 400 MG CAPSULE: 400 | 30 days supply | Qty: 180 | Fill #0

## 2015-10-15 MED FILL — traZODone HCL 50 MG TABS: 50 | 30 days supply | Qty: 30 | Fill #0

## 2015-10-15 MED FILL — LANTUS 100 UNITS/ML VIAL: 100 | 50 days supply | Qty: 10 | Fill #0

## 2015-10-15 MED FILL — ?METFORMIN HCL 1,000 MG TAB: 1000 | 30 days supply | Qty: 60 | Fill #0

## 2015-10-15 MED FILL — raNITIdine HCL 150 MG TABS: 150 | 30 days supply | Qty: 60 | Fill #0

## 2015-10-15 NOTE — Progress Notes (Addendum)
Patient is here for HFU  Patient presents to the clinic very clammy and sweating from top to bottom. Patient appears unbalanced and nods off during conversation. Patient ate a biscuit this morning. MA check sugar level immediately and resulted 184.  Patient states he has been hospitalized  Patient denies any suicidal ideations at this time.

## 2015-10-15 NOTE — Progress Notes (Signed)
Subjective:    Patient ID: Bryan Gallegos, male    DOB: 10/01/1975, 40 y.o.   MRN: [id number]  HPI Pt just in ED 10/14/15.  Pt with RUQ abdominal pain. Pt adm 5 x in 5 months.    Note just adm 10/08/15 and d/c to home 10/13/15 Not able to afford meds.  Hx of VCD  Here with diaphoresis and ED f/u.    40 year old man with recent admission for airway compromise secondary to uvular enlargement, likely allergic reaction, required intubation at that time. Presented again 8/9 with cough, shortness of breath, pleuritic chest pain. Admitted for acute hypoxic respiratory failure of unclear etiology, pleuritic chest pain.  Hospital Course:  Because of ongoing symptoms with cough, dyspnea and vocal hoarseness he was seen by pulmonology who recommended ENT consultation. Notably noncompliant with treatment, ambulated off the unit several times to smoke. 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 Recommendations for her for GERD therapy. Seen by speech pathology with recommendation for regular solids, nectar thick liquids with chin tuck. Condition improved and he was felt to be stable by pulmonology for discharge.  1. Acute hypoxic, hypercapnic respiratory failure. Resolved. Etiology unclear. Thought secondary to vocal cord dysfunction.Does not meet requirement for oxygen. 2. Cough, vocal cord dysfunction/spasm, laryngeal pharyngeal reflux, dyspnea thought to be multifactorial including difficult intubation superimposed on severe obstructive sleep apnea and possible OHS. Continue treatment for GERD with PPI twice a day and Zantac at night.  3. Pleuritic chest pain. CT angiogram chest negative.No further evaluation suggested. Secondary to cough. 4. Diabetes mellitus, , Fair control while on steroids. Anticipate will need insulin for short period of time. However steroid taper recommended by pulmonology. 5. Tobacco use disorder 6. Severe  obstructive sleep apnea 7. Morbid obesity 8. Bipolar disorder, ADHD 9. Wound infection right calf started on oral antibiotics. Mupirocin ointment to area twice daily, with non-adherent gauze placed   Per pulmonology qualifies for Trilogy vent at homewhich they have arranged   No ACE inhibitors for life.  Home today on steroid taper, long-acting insulin. Patient will be educated prior to discharge by nurse. 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  Diet per speech therapy  FEEs swallow assessment as outpatient  Pt still smoking.  Bipolar.  Not on oxygen. No sleep study yet done.  Pt has Cpap machine.  Not able to afford meds Difficult time sleeping, to bed to chair constantly       Review of Systems  Constitutional: Positive for diaphoresis and fatigue.  HENT: Positive for postnasal drip, rhinorrhea, trouble swallowing and voice change.   Respiratory: Positive for cough, choking, chest tightness, shortness of breath, wheezing and stridor.   Cardiovascular: Positive for chest pain.  Gastrointestinal:       No heartburn  Psychiatric/Behavioral: Positive for sleep disturbance.       Objective:   Physical Exam Vitals:   10/15/15 1146  BP: 106/66  Pulse: 74  Resp: 18  Temp: 98.4 F (36.9 C)  TempSrc: Oral  SpO2: 92%  Weight: (!) 381 lb 12.8 oz (173.2 kg)  Height: 6\' 2"  (1.88 m)    Gen: obese diaphoretic WM   ENT: No lesions,  mouth clear,  oropharynx clear, no postnasal drip  Neck: No JVD, no TMG, no carotid bruits  Lungs: No use of accessory muscles, no dullness to percussion, clear without rales or rhonchi  Cardiovascular: RRR, heart sounds normal, no murmur or gallops, no peripheral edema  Abdomen: soft and tender RUQ, no HSM,  BS normal  Musculoskeletal: No deformities, no cyanosis or clubbing  Neuro: alert, non focal  Skin: Warm, no lesions or rashes  Ct Abdomen Pelvis W Contrast  Result  Date: 10/14/2015 CLINICAL DATA:  Acute onset of generalized abdominal pain. Initial encounter. EXAM: CT ABDOMEN AND PELVIS WITH CONTRAST TECHNIQUE: Multidetector CT imaging of the abdomen and pelvis was performed using the standard protocol following bolus administration of intravenous contrast. CONTRAST:  100mL ISOVUE-300 IOPAMIDOL (ISOVUE-300) INJECTION 61% COMPARISON:  Abdominal ultrasound performed 05/17/2015, and CT of the abdomen and pelvis from 05/16/2015 FINDINGS: The visualized lung bases are clear. The liver and spleen are unremarkable in appearance. The gallbladder is within normal limits. The pancreas and adrenal glands are unremarkable. The kidneys are unremarkable in appearance. There is no evidence of hydronephrosis. No renal or ureteral stones are seen. Mild nonspecific perinephric stranding is noted bilaterally. No free fluid is identified. The small bowel is unremarkable in appearance. The stomach is within normal limits. No acute vascular abnormalities are seen. The appendix is normal in caliber, without evidence of appendicitis. The colon is grossly unremarkable. The bladder is mildly distended and grossly unremarkable. The prostate remains normal in size. No inguinal lymphadenopathy is seen. No acute osseous abnormalities are identified. IMPRESSION: Unremarkable contrast-enhanced CT of the abdomen and pelvis. Electronically Signed   By: Roanna RaiderJeffery  Chang M.D.   On: 10/14/2015 22:19   [place] Abdomen Limited Ruq  Result Date: 10/15/2015 CLINICAL DATA:  40 y/o  M; abdominal pain and history of gallstone. EXAM: US ABDOMEN LIMITED - RIGHT UPPER QUADRANT COMPARISON:  CT of abdomen and pelvis dated 10/14/2015. Abdominal sonogram dated 05/17/2015. FINDINGS: Gallbladder: Single 1 cm calculus without significant interval change from prior ultrasound. No gallbladder wall thickening, pericholecystic fluid, or gallbladder enlargement. Negative sonographic Murphy's sign. Common bile duct: Diameter: 8 mm  proximally, 6 mm distally, within normal limits. Liver: No focal lesion identified. Within normal limits in parenchymal echogenicity. IMPRESSION: Single unchanged gallstone.  No signs of acute cholecystitis. Electronically Signed   By: Mitzi HansenLance  Furusawa-Stratton M.D.   On: 10/15/2015 00:20          Assessment & Plan:  I personally reviewed all images and [facility] as well as any outside material available during this office visit and agree with the  radiology impressions.   Asthma exacerbation Asthmatic bronchitis now resolved Plan  renew inhalers No more steroids  OSA (obstructive sleep apnea) osa on cpap Cont cpap rx See sleep MD  Respiratory failure with hypoxia (HCC) resp failure  resolved  Gastroesophageal reflux disease with esophagitis Severe reflux disease Refill H2 and PPI   Cholelithiasis abd pain and GB dz Referral back to GI  Chronic back pain greater than 3 months duration Chronic pain Narcotic dependency Needs PCP to manage narcotic regimen I refilled neurontin   Tobacco use disorder Ongoing tobacco use   Opioid use disorder, severe, dependence (HCC) Ongoing opiod dependence I declined to refill narcotics Pt needs PCP f/u  Hoarse voice quality VCD and VC edema Avoid ACE inhibitors  Diabetes mellitus type II, controlled (HCC) Refilled lantus CBG 184   Kaushal was seen today for hospitalization follow-up.  Diagnoses and all orders for this visit:  Asthma, mild persistent, uncomplicated -     albuterol (PROVENTIL HFA;VENTOLIN HFA) 108 (90 Base) MCG/ACT inhaler; Inhale 2 puffs into the lungs every 2 (two)  hours as needed for wheezing or shortness of breath.  Type 2 diabetes mellitus without complication, with long-term current use of insulin (HCC) -     Glucose (CBG)  OSA on CPAP  Tobacco use disorder  Chronic pain syndrome -     Ambulatory referral to Gastroenterology  Asthma exacerbation  OSA (obstructive sleep  apnea)  Acute on chronic respiratory failure with hypoxia (HCC)  Gastroesophageal reflux disease with esophagitis  Calculus of gallbladder without cholecystitis without obstruction  Chronic back pain greater than 3 months duration  Opioid use disorder, severe, dependence (HCC)  Hoarse voice quality  Controlled type 2 diabetes mellitus without complication, with long-term current use of insulin (HCC)  Other orders -     citalopram (CELEXA) 20 MG tablet; Take 1 tablet (20 mg total) by mouth every morning. -     gabapentin (NEURONTIN) 400 MG capsule; Take 2 capsules (800 mg total) by mouth 3 (three) times daily. -     insulin glargine (LANTUS) 100 UNIT/ML injection; Inject 0.2 mLs (20 Units total) into the skin at bedtime. -     metFORMIN (GLUCOPHAGE) 1000 MG tablet; Take 1 tablet (1,000 mg total) by mouth 2 (two) times daily. -     omeprazole (PRILOSEC) 40 MG capsule; Take 1 capsule (40 mg total) by mouth 2 (two) times daily. -     traZODone (DESYREL) 50 MG tablet; Take 1 tablet (50 mg total) by mouth at bedtime. For sleep -     ranitidine (ZANTAC) 300 MG tablet; Take 1 tablet (300 mg total) by mouth at bedtime.

## 2015-10-15 NOTE — Patient Instructions (Addendum)
Refills sent for multiple medications to our pharmacy USe Cpap at night Resume omeprazole twice daily and ranitidine 300mg  at bedtime I did not refill oxycodone.  Need to have primary pain MD prescribe this medication Use advil/neurontin for pain for now We will ask a Stomach Doctor to see you again, Dr Tilda FrancoStark A primary Care MD will be obtained

## 2015-10-16 ENCOUNTER — Telehealth: Payer: Self-pay | Admitting: *Deleted

## 2015-10-16 NOTE — Assessment & Plan Note (Signed)
Chronic pain Narcotic dependency Needs PCP to manage narcotic regimen I refilled neurontin

## 2015-10-16 NOTE — Assessment & Plan Note (Signed)
Ongoing tobacco use 

## 2015-10-16 NOTE — Assessment & Plan Note (Signed)
Asthmatic bronchitis now resolved Plan  renew inhalers No more steroids

## 2015-10-16 NOTE — Telephone Encounter (Signed)
MA left a VM based on patients verbal in the office to leave appointment time on the cell-phone voicemail.  Patients appointment is 10/23/15 next Thursday at 9am with Dr. Hyman HopesJegede.

## 2015-10-16 NOTE — Assessment & Plan Note (Signed)
abd pain and GB dz Referral back to GI

## 2015-10-16 NOTE — Assessment & Plan Note (Signed)
osa on cpap Cont cpap rx See sleep MD

## 2015-10-16 NOTE — Assessment & Plan Note (Signed)
VCD and VC edema Avoid ACE inhibitors

## 2015-10-16 NOTE — Assessment & Plan Note (Signed)
Refilled lantus CBG 184

## 2015-10-16 NOTE — Assessment & Plan Note (Signed)
Severe reflux disease Refill H2 and PPI

## 2015-10-16 NOTE — Assessment & Plan Note (Signed)
resp failure  resolved

## 2015-10-16 NOTE — Assessment & Plan Note (Signed)
Ongoing opiod dependence I declined to refill narcotics Pt needs PCP f/u

## 2015-10-22 ENCOUNTER — Telehealth: Payer: Self-pay | Admitting: *Deleted

## 2015-10-22 ENCOUNTER — Telehealth: Payer: Self-pay | Admitting: Internal Medicine

## 2015-10-22 ENCOUNTER — Ambulatory Visit: Payer: Medicare Other | Attending: Internal Medicine | Admitting: Internal Medicine

## 2015-10-22 ENCOUNTER — Encounter: Payer: Self-pay | Admitting: Internal Medicine

## 2015-10-22 VITALS — BP 145/70 | HR 104 | Temp 98.3°F | Resp 20 | Ht 74.0 in | Wt 385.2 lb

## 2015-10-22 DIAGNOSIS — I639 Cerebral infarction, unspecified: Secondary | ICD-10-CM | POA: Diagnosis not present

## 2015-10-22 DIAGNOSIS — F319 Bipolar disorder, unspecified: Secondary | ICD-10-CM | POA: Diagnosis not present

## 2015-10-22 DIAGNOSIS — Z0000 Encounter for general adult medical examination without abnormal findings: Secondary | ICD-10-CM | POA: Diagnosis not present

## 2015-10-22 DIAGNOSIS — E669 Obesity, unspecified: Secondary | ICD-10-CM | POA: Insufficient documentation

## 2015-10-22 DIAGNOSIS — I38 Endocarditis, valve unspecified: Secondary | ICD-10-CM | POA: Diagnosis not present

## 2015-10-22 DIAGNOSIS — G894 Chronic pain syndrome: Secondary | ICD-10-CM | POA: Diagnosis not present

## 2015-10-22 DIAGNOSIS — Z6841 Body Mass Index (BMI) 40.0 and over, adult: Secondary | ICD-10-CM | POA: Insufficient documentation

## 2015-10-22 DIAGNOSIS — F909 Attention-deficit hyperactivity disorder, unspecified type: Secondary | ICD-10-CM | POA: Diagnosis not present

## 2015-10-22 DIAGNOSIS — Z841 Family history of disorders of kidney and ureter: Secondary | ICD-10-CM | POA: Insufficient documentation

## 2015-10-22 DIAGNOSIS — Z818 Family history of other mental and behavioral disorders: Secondary | ICD-10-CM | POA: Diagnosis not present

## 2015-10-22 DIAGNOSIS — J45909 Unspecified asthma, uncomplicated: Secondary | ICD-10-CM | POA: Insufficient documentation

## 2015-10-22 DIAGNOSIS — E785 Hyperlipidemia, unspecified: Secondary | ICD-10-CM | POA: Diagnosis not present

## 2015-10-22 DIAGNOSIS — Z833 Family history of diabetes mellitus: Secondary | ICD-10-CM | POA: Insufficient documentation

## 2015-10-22 DIAGNOSIS — F17200 Nicotine dependence, unspecified, uncomplicated: Secondary | ICD-10-CM | POA: Diagnosis not present

## 2015-10-22 DIAGNOSIS — Z8673 Personal history of transient ischemic attack (TIA), and cerebral infarction without residual deficits: Secondary | ICD-10-CM | POA: Insufficient documentation

## 2015-10-22 DIAGNOSIS — Z23 Encounter for immunization: Secondary | ICD-10-CM | POA: Diagnosis not present

## 2015-10-22 DIAGNOSIS — Z8249 Family history of ischemic heart disease and other diseases of the circulatory system: Secondary | ICD-10-CM | POA: Insufficient documentation

## 2015-10-22 DIAGNOSIS — Z7982 Long term (current) use of aspirin: Secondary | ICD-10-CM | POA: Insufficient documentation

## 2015-10-22 DIAGNOSIS — Z801 Family history of malignant neoplasm of trachea, bronchus and lung: Secondary | ICD-10-CM | POA: Insufficient documentation

## 2015-10-22 DIAGNOSIS — K219 Gastro-esophageal reflux disease without esophagitis: Secondary | ICD-10-CM | POA: Insufficient documentation

## 2015-10-22 DIAGNOSIS — Z794 Long term (current) use of insulin: Secondary | ICD-10-CM | POA: Insufficient documentation

## 2015-10-22 DIAGNOSIS — I10 Essential (primary) hypertension: Secondary | ICD-10-CM | POA: Insufficient documentation

## 2015-10-22 DIAGNOSIS — Z800 Family history of malignant neoplasm of digestive organs: Secondary | ICD-10-CM | POA: Insufficient documentation

## 2015-10-22 DIAGNOSIS — Z888 Allergy status to other drugs, medicaments and biological substances status: Secondary | ICD-10-CM | POA: Diagnosis not present

## 2015-10-22 DIAGNOSIS — Z811 Family history of alcohol abuse and dependence: Secondary | ICD-10-CM | POA: Insufficient documentation

## 2015-10-22 DIAGNOSIS — E119 Type 2 diabetes mellitus without complications: Secondary | ICD-10-CM | POA: Diagnosis not present

## 2015-10-22 DIAGNOSIS — Z823 Family history of stroke: Secondary | ICD-10-CM | POA: Diagnosis not present

## 2015-10-22 DIAGNOSIS — Z9989 Dependence on other enabling machines and devices: Secondary | ICD-10-CM

## 2015-10-22 DIAGNOSIS — Z8261 Family history of arthritis: Secondary | ICD-10-CM | POA: Insufficient documentation

## 2015-10-22 DIAGNOSIS — F17210 Nicotine dependence, cigarettes, uncomplicated: Secondary | ICD-10-CM | POA: Insufficient documentation

## 2015-10-22 DIAGNOSIS — G4733 Obstructive sleep apnea (adult) (pediatric): Secondary | ICD-10-CM | POA: Diagnosis not present

## 2015-10-22 LAB — POCT GLYCOSYLATED HEMOGLOBIN (HGB A1C): Hemoglobin A1C: 7

## 2015-10-22 LAB — GLUCOSE, POCT (MANUAL RESULT ENTRY): POC Glucose: 148 mg/dl — AB (ref 70–99)

## 2015-10-22 MED ORDER — ACCU-CHEK SOFTCLIX LANCET DEV MISC: 0 refills | Status: DC

## 2015-10-22 MED ORDER — ACETAMINOPHEN-CODEINE #3 300-30 MG PO TABS: 1.0000 | ORAL_TABLET | ORAL | 0 refills | Status: DC | PRN

## 2015-10-22 MED ORDER — GLUCOSE BLOOD VI STRP: ORAL_STRIP | 12 refills | Status: DC

## 2015-10-22 MED ORDER — ACCU-CHEK AVIVA PLUS W/DEVICE KIT: 1.0000 | PACK | Freq: Three times a day (TID) | 0 refills | Status: DC

## 2015-10-22 MED ORDER — ACCU-CHEK AVIVA PLUS W/DEVICE KIT
1.0000 | PACK | Freq: Three times a day (TID) | 0 refills | Status: DC
Start: 2015-10-22 — End: 2015-10-22

## 2015-10-22 MED ORDER — CLONIDINE HCL 0.1 MG PO TABS: 0.2000 mg | ORAL_TABLET | Freq: Two times a day (BID) | ORAL | 3 refills | Status: DC

## 2015-10-22 MED FILL — ?CLONIDINE HCL 0.1 MG TABL: 0.1 | 22 days supply | Qty: 90 | Fill #0

## 2015-10-22 MED FILL — ACETAMINOPHEN/COD #3 TABLET: 300-30 | 22 days supply | Qty: 90 | Fill #0

## 2015-10-22 NOTE — Patient Instructions (Signed)

## 2015-10-22 NOTE — Progress Notes (Signed)
Patient is here for FU DM  Patient denies pain at this time.  Patient complains of SOB with minimal exertion.  Patient has taken medication today and patient has not eaten.  Patient would like the flu vaccine today. Patient tolerated injection well today.

## 2015-10-22 NOTE — Telephone Encounter (Signed)
Patient verified DOB Patient is aware of DM supplies [facility] [place]. Patient states he takes 2 tablets 2 times a day of 0.2mg  clonidine. Patient would like refill prescription to reflect this. MA informed patient of request being routed to PCP for review and approval. Patient expressed his understanding and had no further questions at this time.

## 2015-10-22 NOTE — Telephone Encounter (Signed)
Pharmacy called needs rs transferred to different pharmacy due to them not being able to bill Part B. [facility] will use [facility] [address]

## 2015-10-22 NOTE — Progress Notes (Signed)
Bryan Gallegos, is a 40 y.o. male  BWG:[id number]  VXB:[id number]  DOB - 1975-09-28  CC:  Chief Complaint  Patient presents with  . Diabetes  . Asthma      HPI: Bryan Gallegos is a 40 y.o. male here today to establish medical care. He has medical history significant for hypertension, diabetes mellitus, asthma, GERD, tobacco abuse, TIA, OSA not using CPAP, obesity, bipolar disorder, and ADHD. Patient was recently hospitalized from 7/29-10/01/15 due to airway compromise and enlarged uvular secondary to allergic reaction to lisinopril. He was intubated in that admission. He was discharged in stable condition on prednisone, but the patient states that he is not taking prednisone due to concerning for developing hives. He has being to the ED since discharge for RUQ pain associated with N/V and diarrhea but no fever or urinary symptom. CT of the abdomen without any evidence of appendicitis, diverticulitis, small bowel obstruction, colitis. Labs revealed leukocytosis. Ultrasound reveals stable cholelithiasis without evidence of cholecystitis. Patient was provided with symptomatic pain and nausea medication as well as IV fluids and discharged home. He was here last week to follow up with pulmonologist as part of his discharge planning. He was requesting for narcotics to treat his chronic pain but his request was declined, he is here with the hope that narcotics will be prescribed. When offered Tylenol 3 or Tramadol, patient became angry and agitated, questioning "why this appointment was made when we would not prescribe him strong pain medication. He will be referred to pain clinic. All his medications are up to date and reviewed today. Patient has No headache, No chest pain, No abdominal pain - No Nausea, No new weakness tingling or numbness. He continues to smoke heavily about 1PPD.  Allergies  Allergen Reactions  . Atenolol Anaphylaxis  . Lisinopril Anaphylaxis    NO ACE INHIBITORS!!!  .  Prednisone Hives  . Tylenol [Acetaminophen] Other (See Comments)    GI Bleed   Past Medical History:  Diagnosis Date  . Adult ADHD   . Anaphylactic reaction   . Anxiety   . Asthma   . Bipolar disorder ([place])   . Complication of anesthesia    Per patient difficult intubation;  . Diabetes mellitus   . Difficult intubation    Per patient  . Heart valve disorder    s/p echocardiogram  . Hyperlipidemia   . Hypertension   . Morbidly obese ([place])   . OSA (obstructive sleep apnea)   . Transient cerebral ischemia    Unknown   Current Outpatient Prescriptions on File Prior to Visit  Medication Sig Dispense Refill  . albuterol (PROVENTIL HFA;VENTOLIN HFA) 108 (90 Base) MCG/ACT inhaler Inhale 2 puffs into the lungs every 2 (two) hours as needed for wheezing or shortness of breath. 1 Inhaler 4  . amphetamine-dextroamphetamine (ADDERALL) 30 MG tablet Take 30 mg by mouth 2 (two) times daily.     Marland Kitchen aspirin 325 MG tablet Take 1 tablet (325 mg total) by mouth daily. For heart health 30 tablet 0  . blood glucose meter kit and supplies KIT Dispense based on patient and insurance preference. Use up to four times daily as directed. (FOR ICD-9 250.00, 250.01). 1 each 0  . citalopram (CELEXA) 20 MG tablet Take 1 tablet (20 mg total) by mouth every morning. 30 tablet 6  . divalproex (DEPAKOTE) 500 MG DR tablet Take 500 mg by mouth 2 (two) times daily.  0  . gabapentin (NEURONTIN) 400 MG capsule Take 2 capsules (800 mg  total) by mouth 3 (three) times daily. 180 capsule 4  . ibuprofen (ADVIL,MOTRIN) 200 MG tablet Take 400-600 mg by mouth every 6 (six) hours as needed for mild pain or moderate pain.    Marland Kitchen insulin glargine (LANTUS) 100 UNIT/ML injection Inject 0.2 mLs (20 Units total) into the skin at bedtime. 10 mL 6  . metFORMIN (GLUCOPHAGE) 1000 MG tablet Take 1 tablet (1,000 mg total) by mouth 2 (two) times daily. 60 tablet 6  . omeprazole (PRILOSEC) 40 MG capsule Take 1 capsule (40 mg total) by mouth 2  (two) times daily. 60 capsule 6  . oxycodone (ROXICODONE) 30 MG immediate release tablet Take 30 mg by mouth every 4 (four) hours as needed for pain.    . ranitidine (ZANTAC) 300 MG tablet Take 1 tablet (300 mg total) by mouth at bedtime. 30 tablet 6  . traZODone (DESYREL) 50 MG tablet Take 1 tablet (50 mg total) by mouth at bedtime. For sleep 30 tablet 6  . nicotine (NICODERM CQ - DOSED IN MG/24 HOURS) 21 mg/24hr patch Place 1 patch (21 mg total) onto the skin daily. (Patient not taking: Reported on 10/22/2015) 28 patch 0  . [DISCONTINUED] lisinopril (PRINIVIL,ZESTRIL) 40 MG tablet Take 1 tablet (40 mg total) by mouth 2 (two) times daily. For high blood pressure 60 tablet 0   No current facility-administered medications on file prior to visit.    Family History  Problem Relation Age of Onset  . CAD Mother     Living  . Diabetes Mellitus II Mother   . Stroke Mother   . Hypertension Mother   . Congestive Heart Failure Mother   . Kidney disease Mother   . Fibromyalgia Mother   . Thyroid disease Mother   . Hyperlipidemia Mother   . Liver disease Mother   . Alcoholism Father 48    Deceased  . Arthritis Maternal Grandmother   . Congestive Heart Failure Maternal Grandmother   . Hypertension Maternal Grandmother   . Lung cancer Maternal Grandfather   . Colon cancer Maternal Aunt   . Stomach cancer Maternal Aunt   . Heart disease Other     Paternal & Maternal  . Crohn's disease Other   . Hypertension Other     Paternal & Maternal  . Hypertension Brother     x3  . Hypertension Sister     #1  . Bipolar disorder Sister     #1  . ADD / ADHD Son     x3  . Bipolar disorder Son     x3  . Asperger's syndrome Son    Social History   Social History  . Marital status: Single    Spouse name: N/A  . Number of children: N/A  . Years of education: N/A   Occupational History  . Not on file.   Social History Main Topics  . Smoking status: Current Every Day Smoker    Packs/day: 0.50      Years: 23.00    Types: Cigarettes  . Smokeless tobacco: Never Used  . Alcohol use No  . Drug use: No  . Sexual activity: Not Currently    Partners: Female    Birth control/ protection: None   Other Topics Concern  . Not on file   Social History Narrative  . No narrative on file   Review of Systems: Constitutional: Negative for fever, chills, diaphoresis, activity change, appetite change and fatigue. HENT: Negative for ear pain, nosebleeds, congestion, facial swelling, rhinorrhea, neck pain,  neck stiffness and ear discharge.  Eyes: Negative for pain, discharge, redness, itching and visual disturbance. Cardiovascular: Negative for chest pain, palpitations and leg swelling. Gastrointestinal: Negative for abdominal distention. Genitourinary: Negative for dysuria, urgency, frequency, hematuria, flank pain, decreased urine volume, difficulty urinating and dyspareunia.  Musculoskeletal: Chronic back pain, No joint swelling Neurological: Negative for dizziness, tremors, seizures, syncope, facial asymmetry, speech difficulty, weakness, light-headedness, numbness and headaches.  Hematological: Negative for adenopathy. Does not bruise/bleed easily. Psychiatric/Behavioral: Negative for hallucinations, behavioral problems, confusion, dysphoric mood, decreased concentration and agitation.   Objective:   Vitals:   10/22/15 0949  BP: (!) 145/70  Pulse: (!) 104  Resp: 20  Temp: 98.3 F (36.8 C)   Physical Exam: Constitutional: Patient appears well-developed and well-nourished. No distress. HENT: Normocephalic, atraumatic, External right and left ear normal. Oropharynx is clear and moist.  Eyes: Conjunctivae and EOM are normal. PERRLA, no scleral icterus. Neck: Normal ROM. Neck supple. No JVD. No tracheal deviation. No thyromegaly. CVS: RRR, S1/S2 +, no murmurs, no gallops, no carotid bruit.  Pulmonary: Effort and breath sounds normal, no stridor, rhonchi, wheezes, rales.  Abdominal:  Soft. BS +, no distension, tenderness, rebound or guarding.  Musculoskeletal: Normal range of motion. No edema and no tenderness.  Lymphadenopathy: No lymphadenopathy noted, cervical, inguinal or axillary Neuro: Alert. Normal reflexes, muscle tone coordination. No cranial nerve deficit. Skin: Skin is warm and dry. No rash noted. Not diaphoretic. No erythema. No pallor. Psychiatric: Normal mood and affect. Behavior, judgment, thought content normal.  Lab Results  Component Value Date   WBC 13.4 (H) 10/14/2015   HGB 15.5 10/14/2015   HCT 45.7 10/14/2015   MCV 90.3 10/14/2015   PLT 245 10/14/2015   Lab Results  Component Value Date   CREATININE 0.80 10/14/2015   BUN 12 10/14/2015   NA 136 10/14/2015   K 4.2 10/14/2015   CL 97 (L) 10/14/2015   CO2 33 (H) 10/14/2015    Lab Results  Component Value Date   HGBA1C 7.0 10/22/2015   Lipid Panel     Component Value Date/Time   CHOL 148 09/04/2013 1017   TRIG 228 (H) 09/04/2013 1017   HDL 19 (L) 09/04/2013 1017   CHOLHDL 7.8 09/04/2013 1017   VLDL 46 (H) 09/04/2013 1017   LDLCALC 83 09/04/2013 1017       Assessment and plan:   1. Controlled type 2 diabetes mellitus without complication, with long-term current use of insulin (HCC)  - POCT A1C - Glucose (CBG) - Microalbumin/Creatinine Ratio, Urine  Aim for 30 minutes of exercise most days. Rethink what you drink. Water is great! Aim for 2-3 Carb Choices per meal (30-45 grams) +/- 1 either way  Aim for 0-15 Carbs per snack if hungry  Include protein in moderation with your meals and snacks  Consider reading food labels for Total Carbohydrate and Fat Grams of foods  Consider checking BG at alternate times per day  Continue taking medication as directed Be mindful about how much sugar you are adding to beverages and other foods. Fruit Punch - find one with no sugar  Measure and decrease portions of carbohydrate foods  Make your plate and don't go back for seconds  2.  Chronic pain syndrome  - acetaminophen-codeine (TYLENOL #3) 300-30 MG tablet; Take 1 tablet by mouth every 4 (four) hours as needed.  Dispense: 90 tablet; Refill: 0 - Ambulatory referral to Psychiatry - Ambulatory referral to Pain Clinic  3. Tobacco use disorder   Corion was  counseled on the dangers of tobacco use, and was advised to quit. Reviewed strategies to maximize success, including removing cigarettes and smoking materials from environment, stress management and support of family/friends.  4. Healthcare maintenance  - Flu Vaccine QUAD 36+ mos PF IM (Fluarix & Fluzone Quad PF)  5. OSA on CPAP  - Dedicated CPAP titration ordered; Future  Return in about 3 months (around 01/22/2016) for Hemoglobin A1C and Follow up, DM, Annual Physical.  The patient was given clear instructions to go to ER or return to medical center if symptoms don't improve, worsen or new problems develop. The patient verbalized understanding. The patient was told to call to get lab results if they haven't heard anything in the next week.     This note has been created with Surveyor, quantity. Any transcriptional errors are unintentional.    Angelica Chessman, MD, MHA, Karilyn Cota, [place], [place]   10/22/2015, 11:03 AM

## 2015-10-23 LAB — MICROALBUMIN / CREATININE URINE RATIO
Creatinine, Urine: 560 mg/dL — ABNORMAL HIGH (ref 20–370)
Microalb Creat Ratio: 3 mcg/mg creat (ref <30)
Microalb, Ur: 1.8 mg/dL

## 2015-10-24 ENCOUNTER — Telehealth (HOSPITAL_COMMUNITY): Payer: Self-pay

## 2015-10-24 ENCOUNTER — Emergency Department (HOSPITAL_COMMUNITY)
Admission: EM | Admit: 2015-10-24 | Discharge: 2015-10-25 | Disposition: A | Discharge status: Home / Self Care | Payer: Medicare Other | Attending: Emergency Medicine | Admitting: Emergency Medicine

## 2015-10-24 ENCOUNTER — Encounter (HOSPITAL_COMMUNITY): Payer: Self-pay | Admitting: Emergency Medicine

## 2015-10-24 ENCOUNTER — Emergency Department (HOSPITAL_COMMUNITY): Payer: Medicare Other

## 2015-10-24 DIAGNOSIS — Z794 Long term (current) use of insulin: Secondary | ICD-10-CM | POA: Diagnosis not present

## 2015-10-24 DIAGNOSIS — J45909 Unspecified asthma, uncomplicated: Secondary | ICD-10-CM | POA: Insufficient documentation

## 2015-10-24 DIAGNOSIS — Z79899 Other long term (current) drug therapy: Secondary | ICD-10-CM | POA: Diagnosis not present

## 2015-10-24 DIAGNOSIS — R0602 Shortness of breath: Secondary | ICD-10-CM | POA: Diagnosis not present

## 2015-10-24 DIAGNOSIS — I10 Essential (primary) hypertension: Secondary | ICD-10-CM | POA: Insufficient documentation

## 2015-10-24 DIAGNOSIS — Z791 Long term (current) use of non-steroidal anti-inflammatories (NSAID): Secondary | ICD-10-CM | POA: Insufficient documentation

## 2015-10-24 DIAGNOSIS — F909 Attention-deficit hyperactivity disorder, unspecified type: Secondary | ICD-10-CM | POA: Diagnosis not present

## 2015-10-24 DIAGNOSIS — Z7984 Long term (current) use of oral hypoglycemic drugs: Secondary | ICD-10-CM | POA: Diagnosis not present

## 2015-10-24 DIAGNOSIS — Z7982 Long term (current) use of aspirin: Secondary | ICD-10-CM | POA: Diagnosis not present

## 2015-10-24 DIAGNOSIS — R079 Chest pain, unspecified: Secondary | ICD-10-CM | POA: Diagnosis not present

## 2015-10-24 DIAGNOSIS — F17210 Nicotine dependence, cigarettes, uncomplicated: Secondary | ICD-10-CM | POA: Insufficient documentation

## 2015-10-24 DIAGNOSIS — E119 Type 2 diabetes mellitus without complications: Secondary | ICD-10-CM | POA: Insufficient documentation

## 2015-10-24 MED ORDER — IPRATROPIUM BROMIDE 0.02 % IN SOLN
0.5000 mg | Freq: Once | RESPIRATORY_TRACT | Status: AC
  Administered 2015-10-24: 0.5 mg via RESPIRATORY_TRACT
  Filled 2015-10-24: qty 2.5

## 2015-10-24 MED ORDER — HYDROMORPHONE HCL 1 MG/ML IJ SOLN
1.0000 mg | Freq: Once | INTRAMUSCULAR | Status: AC
  Administered 2015-10-25: 1 mg via INTRAVENOUS
  Filled 2015-10-24: qty 1

## 2015-10-24 MED ORDER — ALBUTEROL SULFATE (2.5 MG/3ML) 0.083% IN NEBU
5.0000 mg | INHALATION_SOLUTION | Freq: Once | RESPIRATORY_TRACT | Status: AC
  Administered 2015-10-24: 5 mg via RESPIRATORY_TRACT
  Filled 2015-10-24: qty 6

## 2015-10-24 MED ORDER — SODIUM CHLORIDE 0.9 % IV SOLN
INTRAVENOUS | Status: DC
  Administered 2015-10-24: via INTRAVENOUS

## 2015-10-24 NOTE — ED Provider Notes (Signed)
[facility] DEPT Provider Note   CSN: [id number] Arrival date & time: 10/24/15  2158     History   Chief Complaint Chief Complaint  Patient presents with  . Shortness of Breath    HPI Bryan Gallegos is a 40 y.o. male.  40 year old male with history of morbid obesity as well as obstructive sleep apnea here complaining of persistent left-sided chest pain characterized as sharp and lasting for 2 days. Denies any fever or cough. Short of breath is exertional as well as his has some orthopnea. Denies any anginal type quality to his chest pain. Has chronic swelling of his bilateral lower extremities. Symptoms have been persistent and progressively worse. No treatment use prior to arrival      Past Medical History:  Diagnosis Date  . Adult ADHD   . Anaphylactic reaction   . Anxiety   . Asthma   . Bipolar disorder ([place])   . Complication of anesthesia    Per patient difficult intubation;  . Diabetes mellitus   . Difficult intubation    Per patient  . Heart valve disorder    s/p echocardiogram  . Hyperlipidemia   . Hypertension   . Morbidly obese ([place])   . OSA (obstructive sleep apnea)   . Transient cerebral ischemia    Unknown    Patient Active Problem List   Diagnosis Date Noted  . Chronic pain syndrome 10/22/2015  . Healthcare maintenance 10/22/2015  . Hoarse voice quality 10/11/2015  . Major depressive disorder, recurrent severe without psychotic features ([place]) 09/04/2015  . Polysubstance dependence including opioid type drug, episodic abuse ([place]) 09/04/2015  . RUQ abdominal pain   . Cholelithiasis 05/17/2015  . Antisocial personality disorder 04/11/2015  . Opioid use disorder, severe, dependence ([place]) 04/11/2015  . Benzodiazepine dependence ([place]) 04/11/2015  . Tobacco use disorder 04/10/2015  . Narcotic dependency, continuous ([place]) 03/14/2014  . Chronic back pain greater than 3 months duration 01/28/2014  . Gastroesophageal reflux disease with  esophagitis 01/28/2014  . Essential hypertension, benign 01/28/2014  . Diabetes mellitus type II, controlled ([place]) 01/28/2014  . Morbid obesity ([place]) 01/28/2014  . TIA (transient ischemic attack) 09/02/2013  . HLD (hyperlipidemia) 08/06/2013  . OSA on CPAP 08/06/2013    Past Surgical History:  Procedure Laterality Date  . CARPAL TUNNEL RELEASE    . ESOPHAGOGASTRODUODENOSCOPY N/A 04/05/2015   Procedure: ESOPHAGOGASTRODUODENOSCOPY (EGD);  Surgeon: Rogene Houston, MD;  Location: [facility];  Service: Endoscopy;  Laterality: N/A;  . ESOPHAGOGASTRODUODENOSCOPY (EGD) WITH PROPOFOL N/A 05/21/2015   Procedure: ESOPHAGOGASTRODUODENOSCOPY (EGD) WITH PROPOFOL;  Surgeon: Ladene Artist, MD;  Location: [facility];  Service: Endoscopy;  Laterality: N/A;  . NOSE SURGERY    . TOOTH EXTRACTION    . WISDOM TOOTH EXTRACTION         Home Medications    Prior to Admission medications   Medication Sig Start Date End Date Taking? Authorizing Provider  acetaminophen-codeine (TYLENOL #3) 300-30 MG tablet Take 1 tablet by mouth every 4 (four) hours as needed. 10/22/15   Tresa Garter, MD  albuterol (PROVENTIL HFA;VENTOLIN HFA) 108 (90 Base) MCG/ACT inhaler Inhale 2 puffs into the lungs every 2 (two) hours as needed for wheezing or shortness of breath. 10/15/15   Elsie Stain, MD  amphetamine-dextroamphetamine (ADDERALL) 30 MG tablet Take 30 mg by mouth 2 (two) times daily.     Historical Provider, MD  aspirin 325 MG tablet Take 1 tablet (325 mg total) by mouth daily. For heart health 09/05/15  Encarnacion Slates, NP  blood glucose meter kit and supplies KIT Dispense based on patient and insurance preference. Use up to four times daily as directed. (FOR ICD-9 250.00, 250.01). 10/13/15   Samuella Cota, MD  Blood Glucose Monitoring Suppl (ACCU-CHEK AVIVA PLUS) w/Device KIT 1 each by Does not apply route 4 (four) times daily - after meals and at bedtime. 10/22/15   Tresa Garter, MD  citalopram  (CELEXA) 20 MG tablet Take 1 tablet (20 mg total) by mouth every morning. 10/15/15   Elsie Stain, MD  cloNIDine (CATAPRES) 0.1 MG tablet Take 2 tablets (0.2 mg total) by mouth 2 (two) times daily. 10/22/15   Tresa Garter, MD  divalproex (DEPAKOTE) 500 MG DR tablet Take 500 mg by mouth 2 (two) times daily. 10/02/15   Historical Provider, MD  gabapentin (NEURONTIN) 400 MG capsule Take 2 capsules (800 mg total) by mouth 3 (three) times daily. 10/15/15   Elsie Stain, MD  glucose blood (ACCU-CHEK AVIVA) test strip Use as instructed 10/22/15   Tresa Garter, MD  ibuprofen (ADVIL,MOTRIN) 200 MG tablet Take 400-600 mg by mouth every 6 (six) hours as needed for mild pain or moderate pain.    Historical Provider, MD  insulin glargine (LANTUS) 100 UNIT/ML injection Inject 0.2 mLs (20 Units total) into the skin at bedtime. 10/15/15   Elsie Stain, MD  Lancet Devices [facility]) lancets Use as instructed 10/22/15   Tresa Garter, MD  metFORMIN (GLUCOPHAGE) 1000 MG tablet Take 1 tablet (1,000 mg total) by mouth 2 (two) times daily. 10/15/15   Elsie Stain, MD  nicotine (NICODERM CQ - DOSED IN MG/24 HOURS) 21 mg/24hr patch Place 1 patch (21 mg total) onto the skin daily. Patient not taking: Reported on 10/22/2015 10/11/15   Elwin Mocha, MD  omeprazole (PRILOSEC) 40 MG capsule Take 1 capsule (40 mg total) by mouth 2 (two) times daily. 10/15/15   Elsie Stain, MD  oxycodone (ROXICODONE) 30 MG immediate release tablet Take 30 mg by mouth every 4 (four) hours as needed for pain.    Historical Provider, MD  ranitidine (ZANTAC) 300 MG tablet Take 1 tablet (300 mg total) by mouth at bedtime. 10/15/15   Elsie Stain, MD  traZODone (DESYREL) 50 MG tablet Take 1 tablet (50 mg total) by mouth at bedtime. For sleep 10/15/15   Elsie Stain, MD    Family History Family History  Problem Relation Age of Onset  . CAD Mother     Living  . Diabetes Mellitus II Mother   . Stroke  Mother   . Hypertension Mother   . Congestive Heart Failure Mother   . Kidney disease Mother   . Fibromyalgia Mother   . Thyroid disease Mother   . Hyperlipidemia Mother   . Liver disease Mother   . Alcoholism Father 36    Deceased  . Arthritis Maternal Grandmother   . Congestive Heart Failure Maternal Grandmother   . Hypertension Maternal Grandmother   . Lung cancer Maternal Grandfather   . Colon cancer Maternal Aunt   . Stomach cancer Maternal Aunt   . Heart disease Other     Paternal & Maternal  . Crohn's disease Other   . Hypertension Other     Paternal & Maternal  . Hypertension Brother     x3  . Hypertension Sister     #1  . Bipolar disorder Sister     #1  . ADD /  ADHD Son     x3  . Bipolar disorder Son     x3  . Asperger's syndrome Son     Social History Social History  Substance Use Topics  . Smoking status: Current Every Day Smoker    Packs/day: 0.50    Years: 23.00    Types: Cigarettes  . Smokeless tobacco: Never Used  . Alcohol use No     Allergies   Atenolol; Lisinopril; Prednisone; and Tylenol [acetaminophen]   Review of Systems Review of Systems  All other systems reviewed and are negative.    Physical Exam Updated Vital Signs BP 130/83 (BP Location: Left Arm)   Pulse 87   Temp 99.1 F (37.3 C) (Oral)   Resp 22   SpO2 100%   Physical Exam  Constitutional: He is oriented to person, place, and time. He appears well-developed and well-nourished.  Non-toxic appearance. No distress.  HENT:  Head: Normocephalic and atraumatic.  Eyes: Conjunctivae, EOM and lids are normal. Pupils are equal, round, and reactive to light.  Neck: Normal range of motion. Neck supple. No tracheal deviation present. No thyroid mass present.  Cardiovascular: Normal rate, regular rhythm and normal heart sounds.  Exam reveals no gallop.   No murmur heard. Pulmonary/Chest: Effort normal. No stridor. No respiratory distress. He has decreased breath sounds in the  right lower field and the left lower field. He has wheezes in the right lower field and the left lower field. He has no rhonchi. He has no rales. He exhibits tenderness.    Abdominal: Soft. Normal appearance and bowel sounds are normal. He exhibits no distension. There is no tenderness. There is no rebound and no CVA tenderness.  Musculoskeletal: Normal range of motion. He exhibits no edema or tenderness.  Lymphadenopathy:  3+ bilateral extremities pitting edema  Neurological: He is alert and oriented to person, place, and time. He has normal strength. No cranial nerve deficit or sensory deficit. GCS eye subscore is 4. GCS verbal subscore is 5. GCS motor subscore is 6.  Skin: Skin is warm and dry. No abrasion and no rash noted.  Psychiatric: He has a normal mood and affect. His speech is normal and behavior is normal.  Nursing note and vitals reviewed.    ED Treatments / Results  Labs (all labs ordered are listed, but only abnormal results are displayed) Labs Reviewed  CBC WITH DIFFERENTIAL/PLATELET  BASIC METABOLIC PANEL  BRAIN NATRIURETIC PEPTIDE  TROPONIN I    EKG  EKG Interpretation  Date/Time:  Friday October 24 2015 22:06:22 EDT Ventricular Rate:  91 PR Interval:    QRS Duration: 100 QT Interval:  360 QTC Calculation: 443 R Axis:   82 Text Interpretation:  Sinus rhythm Low voltage, precordial leads Baseline wander in lead(s) I II III aVR aVL aVF V2 No significant change since last tracing Confirmed by Antonela Freiman  MD, Tripton Ned ([id number]) on 10/25/2015 12:00:33 AM       Radiology No results found.  Procedures Procedures (including critical care time)  Medications Ordered in ED Medications  0.9 %  sodium chloride infusion (not administered)  albuterol (PROVENTIL) (2.5 MG/3ML) 0.083% nebulizer solution 5 mg (not administered)  ipratropium (ATROVENT) nebulizer solution 0.5 mg (not administered)     Initial Impression / Assessment and Plan / ED Course  I have reviewed the  triage vital signs and the nursing notes.  Pertinent labs & imaging results that were available during my care of the patient were reviewed by me and considered in my  medical decision making (see chart for details).  Clinical Course    Care signed out to Dr. Venora Maples  Final Clinical Impressions(s) / ED Diagnoses   Final diagnoses:  None    New Prescriptions New Prescriptions   No medications on file     Lacretia Leigh, MD 10/25/15 [phone]

## 2015-10-24 NOTE — Telephone Encounter (Signed)
When calling pt to schedule new pt appt he stated he was having suicidal thoughts. In[facility] to go to [facility]. Spoke to Rosey Batheresa (spouse) and Rosey Batheresa stated she would make sure someone got him there. I told them I would call back in an hour to make sure they had gone to the hospital.  I have attempted to reach them on all phone #'s in the chart and have not been able to get a hold of them. [facility] do [facility] well check to make sure everything is okay.

## 2015-10-24 NOTE — ED Triage Notes (Signed)
Pt c/o L chest pain, R upper abd pain and shob x 2 days. [facility].

## 2015-10-25 ENCOUNTER — Emergency Department (HOSPITAL_COMMUNITY)
Admission: EM | Admit: 2015-10-25 | Discharge: 2015-10-26 | Disposition: A | Discharge status: Home / Self Care | Payer: Medicare Other | Attending: Emergency Medicine | Admitting: Emergency Medicine

## 2015-10-25 ENCOUNTER — Encounter (HOSPITAL_COMMUNITY): Payer: Self-pay | Admitting: *Deleted

## 2015-10-25 ENCOUNTER — Encounter (HOSPITAL_COMMUNITY): Payer: Self-pay | Admitting: Radiology

## 2015-10-25 ENCOUNTER — Emergency Department (HOSPITAL_COMMUNITY): Payer: Medicare Other

## 2015-10-25 DIAGNOSIS — R0789 Other chest pain: Secondary | ICD-10-CM | POA: Diagnosis not present

## 2015-10-25 DIAGNOSIS — Z7984 Long term (current) use of oral hypoglycemic drugs: Secondary | ICD-10-CM | POA: Diagnosis not present

## 2015-10-25 DIAGNOSIS — R0602 Shortness of breath: Secondary | ICD-10-CM | POA: Diagnosis not present

## 2015-10-25 DIAGNOSIS — I10 Essential (primary) hypertension: Secondary | ICD-10-CM | POA: Insufficient documentation

## 2015-10-25 DIAGNOSIS — F329 Major depressive disorder, single episode, unspecified: Secondary | ICD-10-CM | POA: Diagnosis not present

## 2015-10-25 DIAGNOSIS — E119 Type 2 diabetes mellitus without complications: Secondary | ICD-10-CM | POA: Insufficient documentation

## 2015-10-25 DIAGNOSIS — Z79899 Other long term (current) drug therapy: Secondary | ICD-10-CM | POA: Diagnosis not present

## 2015-10-25 DIAGNOSIS — Z7982 Long term (current) use of aspirin: Secondary | ICD-10-CM | POA: Insufficient documentation

## 2015-10-25 DIAGNOSIS — J45909 Unspecified asthma, uncomplicated: Secondary | ICD-10-CM | POA: Diagnosis not present

## 2015-10-25 DIAGNOSIS — Z794 Long term (current) use of insulin: Secondary | ICD-10-CM | POA: Diagnosis not present

## 2015-10-25 DIAGNOSIS — F32A Depression, unspecified: Secondary | ICD-10-CM

## 2015-10-25 DIAGNOSIS — R45851 Suicidal ideations: Secondary | ICD-10-CM | POA: Insufficient documentation

## 2015-10-25 DIAGNOSIS — F17210 Nicotine dependence, cigarettes, uncomplicated: Secondary | ICD-10-CM | POA: Diagnosis not present

## 2015-10-25 DIAGNOSIS — F1920 Other psychoactive substance dependence, uncomplicated: Secondary | ICD-10-CM

## 2015-10-25 DIAGNOSIS — F909 Attention-deficit hyperactivity disorder, unspecified type: Secondary | ICD-10-CM | POA: Insufficient documentation

## 2015-10-25 DIAGNOSIS — F1120 Opioid dependence, uncomplicated: Secondary | ICD-10-CM

## 2015-10-25 LAB — BASIC METABOLIC PANEL
Anion gap: 7 (ref 5–15)
BUN: 11 mg/dL (ref 6–20)
CHLORIDE: 104 mmol/L (ref 101–111)
CO2: 26 mmol/L (ref 22–32)
CREATININE: 0.85 mg/dL (ref 0.61–1.24)
Calcium: 9.1 mg/dL (ref 8.9–10.3)
Glucose, Bld: 173 mg/dL — ABNORMAL HIGH (ref 65–99)
Potassium: 4.2 mmol/L (ref 3.5–5.1)
SODIUM: 137 mmol/L (ref 135–145)

## 2015-10-25 LAB — COMPREHENSIVE METABOLIC PANEL
ALBUMIN: 3.5 g/dL (ref 3.5–5.0)
ALT: 37 U/L (ref 17–63)
ANION GAP: 8 (ref 5–15)
AST: 27 U/L (ref 15–41)
Alkaline Phosphatase: 44 U/L (ref 38–126)
BILIRUBIN TOTAL: 0.6 mg/dL (ref 0.3–1.2)
BUN: 10 mg/dL (ref 6–20)
CALCIUM: 8.7 mg/dL — AB (ref 8.9–10.3)
CHLORIDE: 100 mmol/L — AB (ref 101–111)
CO2: 29 mmol/L (ref 22–32)
CREATININE: 1.09 mg/dL (ref 0.61–1.24)
Glucose, Bld: 172 mg/dL — ABNORMAL HIGH (ref 65–99)
POTASSIUM: 4 mmol/L (ref 3.5–5.1)
Sodium: 137 mmol/L (ref 135–145)
Total Protein: 7 g/dL (ref 6.5–8.1)

## 2015-10-25 LAB — CBC
HCT: 40.7 % (ref 39.0–52.0)
HEMOGLOBIN: 13.6 g/dL (ref 13.0–17.0)
MCH: 30.8 pg (ref 26.0–34.0)
MCHC: 33.4 g/dL (ref 30.0–36.0)
MCV: 92.3 fL (ref 78.0–100.0)
PLATELETS: 258 10*3/uL (ref 150–400)
RBC: 4.41 MIL/uL (ref 4.22–5.81)
RDW: 14.3 % (ref 11.5–15.5)
WBC: 6.8 10*3/uL (ref 4.0–10.5)

## 2015-10-25 LAB — CBC WITH DIFFERENTIAL/PLATELET
BASOS PCT: 0 %
Basophils Absolute: 0 10*3/uL (ref 0.0–0.1)
EOS ABS: 0.2 10*3/uL (ref 0.0–0.7)
EOS PCT: 2 %
HCT: 42.9 % (ref 39.0–52.0)
HEMOGLOBIN: 14.7 g/dL (ref 13.0–17.0)
Lymphocytes Relative: 33 %
Lymphs Abs: 3.1 10*3/uL (ref 0.7–4.0)
MCH: 31.5 pg (ref 26.0–34.0)
MCHC: 34.3 g/dL (ref 30.0–36.0)
MCV: 91.9 fL (ref 78.0–100.0)
MONOS PCT: 9 %
Monocytes Absolute: 0.8 10*3/uL (ref 0.1–1.0)
NEUTROS PCT: 56 %
Neutro Abs: 5.4 10*3/uL (ref 1.7–7.7)
PLATELETS: 263 10*3/uL (ref 150–400)
RBC: 4.67 MIL/uL (ref 4.22–5.81)
RDW: 14.1 % (ref 11.5–15.5)
WBC: 9.5 10*3/uL (ref 4.0–10.5)

## 2015-10-25 LAB — ETHANOL: Alcohol, Ethyl (B): <5 mg/dL (ref <5)

## 2015-10-25 LAB — CBG MONITORING, ED: Glucose-Capillary: 147 mg/dL — ABNORMAL HIGH (ref 65–99)

## 2015-10-25 LAB — RAPID URINE DRUG SCREEN, HOSP PERFORMED
AMPHETAMINES: NOT DETECTED
BENZODIAZEPINES: POSITIVE — AB
Barbiturates: NOT DETECTED
Cocaine: NOT DETECTED
OPIATES: POSITIVE — AB
Tetrahydrocannabinol: NOT DETECTED

## 2015-10-25 LAB — I-STAT CG4 LACTIC ACID, ED: Lactic Acid, Venous: 1.66 mmol/L (ref 0.5–1.9)

## 2015-10-25 LAB — TROPONIN I

## 2015-10-25 LAB — BRAIN NATRIURETIC PEPTIDE: B NATRIURETIC PEPTIDE 5: 18.2 pg/mL (ref 0.0–100.0)

## 2015-10-25 MED ORDER — INSULIN GLARGINE 100 UNIT/ML ~~LOC~~ SOLN
20.0000 [IU] | Freq: Every day | SUBCUTANEOUS | Status: DC
  Administered 2015-10-25: 20 [IU] via SUBCUTANEOUS
  Filled 2015-10-25: qty 0.2

## 2015-10-25 MED ORDER — METHOCARBAMOL 500 MG PO TABS
500.0000 mg | ORAL_TABLET | Freq: Three times a day (TID) | ORAL | Status: DC | PRN
  Administered 2015-10-25: 500 mg via ORAL
  Filled 2015-10-25: qty 1

## 2015-10-25 MED ORDER — AMPHETAMINE-DEXTROAMPHETAMINE 30 MG PO TABS: 60.0000 mg | ORAL_TABLET | Freq: Two times a day (BID) | ORAL | Status: DC

## 2015-10-25 MED ORDER — INSULIN ASPART 100 UNIT/ML ~~LOC~~ SOLN: 0.0000 [IU] | Freq: Three times a day (TID) | SUBCUTANEOUS | Status: DC

## 2015-10-25 MED ORDER — ALPRAZOLAM 0.5 MG PO TABS: 2.0000 mg | ORAL_TABLET | Freq: Three times a day (TID) | ORAL | Status: DC | PRN

## 2015-10-25 MED ORDER — GABAPENTIN 400 MG PO CAPS
800.0000 mg | ORAL_CAPSULE | Freq: Three times a day (TID) | ORAL | Status: DC
  Administered 2015-10-25 – 2015-10-26 (×2): 800 mg via ORAL
  Filled 2015-10-25 (×2): qty 2

## 2015-10-25 MED ORDER — NAPROXEN 500 MG PO TABS
500.0000 mg | ORAL_TABLET | Freq: Two times a day (BID) | ORAL | Status: DC | PRN
  Administered 2015-10-25: 500 mg via ORAL
  Filled 2015-10-25: qty 1

## 2015-10-25 MED ORDER — DICYCLOMINE HCL 20 MG PO TABS: 20.0000 mg | ORAL_TABLET | Freq: Four times a day (QID) | ORAL | Status: DC | PRN

## 2015-10-25 MED ORDER — CITALOPRAM HYDROBROMIDE 10 MG PO TABS
20.0000 mg | ORAL_TABLET | ORAL | Status: DC
  Administered 2015-10-26: 20 mg via ORAL
  Filled 2015-10-25: qty 2

## 2015-10-25 MED ORDER — HYDROXYZINE HCL 25 MG PO TABS
25.0000 mg | ORAL_TABLET | Freq: Four times a day (QID) | ORAL | Status: DC | PRN
  Filled 2015-10-25: qty 1

## 2015-10-25 MED ORDER — CLONIDINE HCL 0.1 MG PO TABS
0.2000 mg | ORAL_TABLET | Freq: Two times a day (BID) | ORAL | Status: DC
  Administered 2015-10-26: 0.2 mg via ORAL
  Filled 2015-10-25: qty 2

## 2015-10-25 MED ORDER — CLONIDINE HCL 0.1 MG PO TABS: 0.1000 mg | ORAL_TABLET | Freq: Every day | ORAL | Status: DC

## 2015-10-25 MED ORDER — DIVALPROEX SODIUM 500 MG PO DR TAB
500.0000 mg | DELAYED_RELEASE_TABLET | Freq: Two times a day (BID) | ORAL | Status: DC
  Administered 2015-10-25 – 2015-10-26 (×2): 500 mg via ORAL
  Filled 2015-10-25 (×2): qty 1

## 2015-10-25 MED ORDER — ALBUTEROL SULFATE HFA 108 (90 BASE) MCG/ACT IN AERS: 2.0000 | INHALATION_SPRAY | RESPIRATORY_TRACT | Status: DC | PRN

## 2015-10-25 MED ORDER — CLONIDINE HCL 0.1 MG PO TABS: 0.1000 mg | ORAL_TABLET | ORAL | Status: DC

## 2015-10-25 MED ORDER — ACETAMINOPHEN-CODEINE #3 300-30 MG PO TABS
1.0000 | ORAL_TABLET | ORAL | Status: DC | PRN
  Administered 2015-10-25 – 2015-10-26 (×2): 1 via ORAL
  Filled 2015-10-25 (×2): qty 1

## 2015-10-25 MED ORDER — LORAZEPAM 1 MG PO TABS
1.0000 mg | ORAL_TABLET | Freq: Four times a day (QID) | ORAL | Status: DC | PRN
  Administered 2015-10-25: 1 mg via ORAL
  Filled 2015-10-25: qty 1

## 2015-10-25 MED ORDER — CLONIDINE HCL 0.1 MG PO TABS
0.1000 mg | ORAL_TABLET | Freq: Four times a day (QID) | ORAL | Status: DC
  Administered 2015-10-25 (×2): 0.1 mg via ORAL
  Filled 2015-10-25 (×2): qty 1

## 2015-10-25 MED ORDER — ONDANSETRON 4 MG PO TBDP: 4.0000 mg | ORAL_TABLET | Freq: Four times a day (QID) | ORAL | Status: DC | PRN

## 2015-10-25 MED ORDER — LOPERAMIDE HCL 2 MG PO CAPS: 2.0000 mg | ORAL_CAPSULE | ORAL | Status: DC | PRN

## 2015-10-25 MED ORDER — METFORMIN HCL 500 MG PO TABS
1000.0000 mg | ORAL_TABLET | Freq: Two times a day (BID) | ORAL | Status: DC
  Administered 2015-10-25 – 2015-10-26 (×2): 1000 mg via ORAL
  Filled 2015-10-25 (×2): qty 2

## 2015-10-25 MED ORDER — IOPAMIDOL (ISOVUE-370) INJECTION 76%
100.0000 mL | Freq: Once | INTRAVENOUS | Status: AC | PRN
  Administered 2015-10-25: 100 mL via INTRAVENOUS

## 2015-10-25 NOTE — ED Notes (Signed)
Writer had two unsuccessful attempt, pt sts he usually gets US IV for blood work

## 2015-10-25 NOTE — ED Notes (Signed)
Bed: [facility] Expected date:  Expected time:  Means of arrival:  Comments: 

## 2015-10-25 NOTE — ED Provider Notes (Signed)
Patient feels better this time.  CT scan without acute pulmonary embolism.  No signs of pneumonia.  Patient was able to ambulate in the emergency department without hypoxia.  Discharge home in good condition.  Primary care follow-up.  He understands to return to the ER for new or worsening symptoms.  I think the patient will benefit from outpatient pulmonary follow-up.  He's been given the information to contact them for an appointment.   Azalia BilisKevin Arwin Bisceglia, MD 10/25/15 [phone]

## 2015-10-25 NOTE — BH Assessment (Signed)
Assessment completed. Consulted with Nanine MeansJamison Lord, DNP who recommends overnight observation and evaluation in the morning by psychiatry.   Davina PokeJoVea Deaken Jurgens, LCSW Therapeutic Triage Specialist [facility] 10/25/2015 4:22 PM

## 2015-10-25 NOTE — ED Provider Notes (Addendum)
[facility] DEPT Provider Note   CSN: [id number] Arrival date & time: 10/25/15  1216     History   Chief Complaint Chief Complaint  Patient presents with  . Suicidal    HPI Bryan Gallegos is a 40 y.o. male.  The history is provided by the patient.  Patient presents with depression and suicidal thoughts. States his been going on for while. Mostly due to his poor health but also has bipolar disorder. Seen yesterday in the ER for left-sided chest pain. States he has a plan to overdose on pills. States he almost did it today. States that he didn't tell them yesterday because he had to get out of here to be able to take care of his wife and passed out.  Past Medical History:  Diagnosis Date  . Adult ADHD   . Anaphylactic reaction   . Anxiety   . Asthma   . Bipolar disorder ([place])   . Complication of anesthesia    Per patient difficult intubation;  . Diabetes mellitus   . Difficult intubation    Per patient  . Heart valve disorder    s/p echocardiogram  . Hyperlipidemia   . Hypertension   . Morbidly obese ([place])   . OSA (obstructive sleep apnea)   . Transient cerebral ischemia    Unknown    Patient Active Problem List   Diagnosis Date Noted  . Chronic pain syndrome 10/22/2015  . Healthcare maintenance 10/22/2015  . Hoarse voice quality 10/11/2015  . Major depressive disorder, recurrent severe without psychotic features ([place]) 09/04/2015  . Polysubstance dependence including opioid type drug, episodic abuse ([place]) 09/04/2015  . RUQ abdominal pain   . Cholelithiasis 05/17/2015  . Antisocial personality disorder 04/11/2015  . Opioid use disorder, severe, dependence ([place]) 04/11/2015  . Benzodiazepine dependence ([place]) 04/11/2015  . Tobacco use disorder 04/10/2015  . Narcotic dependency, continuous ([place]) 03/14/2014  . Chronic back pain greater than 3 months duration 01/28/2014  . Gastroesophageal reflux disease with esophagitis 01/28/2014  . Essential hypertension,  benign 01/28/2014  . Diabetes mellitus type II, controlled ([place]) 01/28/2014  . Morbid obesity ([place]) 01/28/2014  . TIA (transient ischemic attack) 09/02/2013  . HLD (hyperlipidemia) 08/06/2013  . OSA on CPAP 08/06/2013    Past Surgical History:  Procedure Laterality Date  . CARPAL TUNNEL RELEASE    . ESOPHAGOGASTRODUODENOSCOPY N/A 04/05/2015   Procedure: ESOPHAGOGASTRODUODENOSCOPY (EGD);  Surgeon: Rogene Houston, MD;  Location: [facility];  Service: Endoscopy;  Laterality: N/A;  . ESOPHAGOGASTRODUODENOSCOPY (EGD) WITH PROPOFOL N/A 05/21/2015   Procedure: ESOPHAGOGASTRODUODENOSCOPY (EGD) WITH PROPOFOL;  Surgeon: Ladene Artist, MD;  Location: [facility] ENDOSCOPY;  Service: Endoscopy;  Laterality: N/A;  . NOSE SURGERY    . TOOTH EXTRACTION    . WISDOM TOOTH EXTRACTION         Home Medications    Prior to Admission medications   Medication Sig Start Date End Date Taking? Authorizing Provider  acetaminophen-codeine (TYLENOL #3) 300-30 MG tablet Take 1 tablet by mouth every 4 (four) hours as needed. Patient taking differently: Take 1 tablet by mouth every 4 (four) hours as needed for moderate pain.  10/22/15  Yes Tresa Garter, MD  albuterol (PROVENTIL HFA;VENTOLIN HFA) 108 (90 Base) MCG/ACT inhaler Inhale 2 puffs into the lungs every 2 (two) hours as needed for wheezing or shortness of breath. 10/15/15  Yes Elsie Stain, MD  alprazolam Duanne Moron) 2 MG tablet Take 2 mg by mouth 3 (three) times daily as needed for sleep  or anxiety.   Yes Historical Provider, MD  amphetamine-dextroamphetamine (ADDERALL) 30 MG tablet Take 60 mg by mouth daily.    Yes Historical Provider, MD  aspirin 325 MG tablet Take 1 tablet (325 mg total) by mouth daily. For heart health 09/05/15  Yes Encarnacion Slates, NP  blood glucose meter kit and supplies KIT Dispense based on patient and insurance preference. Use up to four times daily as directed. (FOR ICD-9 250.00, 250.01). 10/13/15  Yes Samuella Cota, MD  Blood  Glucose Monitoring Suppl (ACCU-CHEK AVIVA PLUS) w/Device KIT 1 each by Does not apply route 4 (four) times daily - after meals and at bedtime. 10/22/15  Yes Tresa Garter, MD  citalopram (CELEXA) 20 MG tablet Take 1 tablet (20 mg total) by mouth every morning. 10/15/15  Yes Elsie Stain, MD  clonazePAM (KLONOPIN) 1 MG tablet Take 1 mg by mouth 2 (two) times daily.   Yes Historical Provider, MD  cloNIDine (CATAPRES) 0.1 MG tablet Take 2 tablets (0.2 mg total) by mouth 2 (two) times daily. 10/22/15  Yes Tresa Garter, MD  divalproex (DEPAKOTE) 500 MG DR tablet Take 500 mg by mouth 2 (two) times daily. 10/02/15  Yes Historical Provider, MD  gabapentin (NEURONTIN) 400 MG capsule Take 2 capsules (800 mg total) by mouth 3 (three) times daily. 10/15/15  Yes Elsie Stain, MD  glucose blood (ACCU-CHEK AVIVA) test strip Use as instructed 10/22/15  Yes Tresa Garter, MD  ibuprofen (ADVIL,MOTRIN) 200 MG tablet Take 400-600 mg by mouth every 6 (six) hours as needed for mild pain or moderate pain.   Yes Historical Provider, MD  insulin glargine (LANTUS) 100 UNIT/ML injection Inject 0.2 mLs (20 Units total) into the skin at bedtime. 10/15/15  Yes Elsie Stain, MD  Lancet Devices [facility]) lancets Use as instructed 10/22/15  Yes Tresa Garter, MD  metFORMIN (GLUCOPHAGE) 1000 MG tablet Take 1 tablet (1,000 mg total) by mouth 2 (two) times daily. 10/15/15  Yes Elsie Stain, MD  omeprazole (PRILOSEC) 40 MG capsule Take 1 capsule (40 mg total) by mouth 2 (two) times daily. 10/15/15  Yes Elsie Stain, MD  oxycodone (ROXICODONE) 30 MG immediate release tablet Take 30 mg by mouth every 4 (four) hours as needed for pain.   Yes Historical Provider, MD  ranitidine (ZANTAC) 300 MG tablet Take 1 tablet (300 mg total) by mouth at bedtime. 10/15/15  Yes Elsie Stain, MD  traZODone (DESYREL) 50 MG tablet Take 1 tablet (50 mg total) by mouth at bedtime. For sleep 10/15/15  Yes Elsie Stain, MD    Family History Family History  Problem Relation Age of Onset  . CAD Mother     Living  . Diabetes Mellitus II Mother   . Stroke Mother   . Hypertension Mother   . Congestive Heart Failure Mother   . Kidney disease Mother   . Fibromyalgia Mother   . Thyroid disease Mother   . Hyperlipidemia Mother   . Liver disease Mother   . Alcoholism Father 109    Deceased  . Arthritis Maternal Grandmother   . Congestive Heart Failure Maternal Grandmother   . Hypertension Maternal Grandmother   . Lung cancer Maternal Grandfather   . Colon cancer Maternal Aunt   . Stomach cancer Maternal Aunt   . Heart disease Other     Paternal & Maternal  . Crohn's disease Other   . Hypertension Other     Paternal &  Maternal  . Hypertension Brother     x3  . Hypertension Sister     #1  . Bipolar disorder Sister     #1  . ADD / ADHD Son     x3  . Bipolar disorder Son     x3  . Asperger's syndrome Son     Social History Social History  Substance Use Topics  . Smoking status: Current Every Day Smoker    Packs/day: 0.50    Years: 23.00    Types: Cigarettes  . Smokeless tobacco: Never Used  . Alcohol use No     Allergies   Atenolol; Lisinopril; Prednisone; and Tylenol [acetaminophen]   Review of Systems Review of Systems  Constitutional: Negative for appetite change.  HENT: Negative for ear discharge.   Eyes: Negative for visual disturbance.  Respiratory: Positive for shortness of breath.   Cardiovascular: Positive for chest pain.  Gastrointestinal: Negative for abdominal pain.  Genitourinary: Negative for flank pain.  Musculoskeletal: Negative for back pain.  Neurological: Negative for headaches.  Psychiatric/Behavioral: Positive for dysphoric mood and suicidal ideas.     Physical Exam Updated Vital Signs BP 127/83 (BP Location: Left Arm)   Pulse 68   Temp 97.8 F (36.6 C) (Oral)   Resp 20   SpO2 94%   Physical Exam  Constitutional: He appears  well-developed and well-nourished.  Patient is morbidly obese  HENT:  Head: Atraumatic.  Eyes: EOM are normal.  Neck: Neck supple.  Cardiovascular: Normal rate.   Pulmonary/Chest: Effort normal. He exhibits tenderness.  Tenderness to left anterior chest wall.  Abdominal: Soft.  Musculoskeletal: He exhibits no edema.  Neurological: He is alert.  Skin: Skin is warm. Capillary refill takes less than 2 seconds.  Psychiatric: His behavior is normal.     ED Treatments / Results  Labs (all labs ordered are listed, but only abnormal results are displayed) Labs Reviewed  COMPREHENSIVE METABOLIC PANEL - Abnormal; Notable for the following:       Result Value   Chloride 100 (*)    Glucose, Bld 172 (*)    Calcium 8.7 (*)    All other components within normal limits  URINE RAPID DRUG SCREEN, HOSP PERFORMED - Abnormal; Notable for the following:    Opiates POSITIVE (*)    Benzodiazepines POSITIVE (*)    All other components within normal limits  CBC  ETHANOL    EKG  EKG Interpretation None       Radiology Ct Angio Chest Pe W Or Wo Contrast  Result Date: 10/25/2015 CLINICAL DATA:  Left-sided chest pain, right upper abdominal pain, and shortness of breath for 2 days. EXAM: CT ANGIOGRAPHY CHEST WITH CONTRAST TECHNIQUE: Multidetector CT imaging of the chest was performed using the standard protocol during bolus administration of intravenous contrast. Multiplanar CT image reconstructions and MIPs were obtained to evaluate the vascular anatomy. CONTRAST:  100 mL Isovue 370 COMPARISON:  10/08/2015 FINDINGS: Fall be adequate study with moderately good opacification of the central and proximal segmental pulmonary arteries. No focal filling defects are demonstrated. No evidence of significant pulmonary embolus. Normal heart size. Normal caliber thoracic aorta. No aortic dissection. Great vessel origins are patent. Esophagus is decompressed. No significant lymphadenopathy in the chest.  Atelectasis in the dependent lungs. Focal nodule in the superior segment right lower lung measuring 8 mm. This was present previously without any significant change. No pneumothorax. No pleural effusions. Airways are patent. Included portions of the upper abdominal organs are grossly unremarkable. No destructive  bone lesions. Degenerative changes in the spine. Review of the MIP images confirms the above findings. IMPRESSION: No evidence of significant pulmonary embolus. No evidence of active pulmonary disease. **An incidental finding of potential clinical significance has been found. 8 mm nodule in the superior segment right lower lung. Non-contrast chest CT at 6-12 months is recommended. If the nodule is stable at time of repeat CT, then future CT at 18-24 months (from today's scan) is considered optional for low-risk patients, but is recommended for high-risk patients. This recommendation follows the consensus statement: Guidelines for Management of Incidental Pulmonary Nodules Detected on CT Images:From the Fleischner Society 2017; published online before print (10.1148/[id number]).** Electronically Signed   By: Lucienne Capers M.D.   On: 10/25/2015 01:32   Dg Chest Port 1 View  Result Date: 10/24/2015 CLINICAL DATA:  Left chest pain, right upper abdominal pain, and shortness of breath for 2 days. Patient was intubated in July. Hypertension, smoker, asthma. EXAM: PORTABLE CHEST 1 VIEW COMPARISON:  10/08/2015 FINDINGS: Shallow inspiration. Normal heart size and pulmonary vascularity. No focal airspace disease or consolidation in the lungs. No blunting of costophrenic angles. No pneumothorax. Mediastinal contours appear intact. IMPRESSION: No active disease. Electronically Signed   By: Lucienne Capers M.D.   On: 10/24/2015 23:40    Procedures Procedures (including critical care time)  Medications Ordered in ED Medications - No data to display   Initial Impression / Assessment and Plan / ED  Course  I have reviewed the triage vital signs and the nursing notes.  Pertinent labs & imaging results that were available during my care of the patient were reviewed by me and considered in my medical decision making (see chart for details).  Clinical Course    Patient with depression and suicidal thoughts. Seen yesterday with more extensive workup. Medically cleared at this time. To be seen by TTS.  Final Clinical Impressions(s) / ED Diagnoses   Final diagnoses:  Suicidal ideation  Depression    New Prescriptions New Prescriptions   No medications on file     Davonna Belling, MD 10/25/15 1607   TTS states they will watch the patient overnight.   Davonna Belling, MD 10/25/15 1710   I reviewed the patient's records from the narcotic database. He states he is on oxycodone 30 mg but unable to find his prescribing at least the last 6 months. Was given 5 mg of oxycodone around 4 months ago and Tylenol threes recently. Order Tylenol threes and patient's home medicines.   Davonna Belling, MD 10/25/15 2001

## 2015-10-25 NOTE — ED Notes (Signed)
Bed: [facility]43 Expected date:  Expected time:  Means of arrival:  Comments: Triage 4 

## 2015-10-25 NOTE — ED Notes (Addendum)
[facility] could not be allowed back here on CPAP.  Matt RN said he asked patient if he used CPAP and told me patient said he did not  use it any more.  I told the RN that if the patient gets back here and states that he needs his CPAP he will have to go back up front where CPAP is allowed and oxygen is available.  When patient arrive to room 43 a few minutes ago I asked him why he doesn't use his CPAP.  The patient states "I do use my CPAP, They told me I couldn't use it back there" "I need it for my lungs"  [facility].
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
## 2015-10-25 NOTE — ED Notes (Signed)
Belongings at nursing station.

## 2015-10-25 NOTE — ED Notes (Signed)
[name] NP called and asked that patient should not get narcotic pain medications.

## 2015-10-25 NOTE — ED Notes (Signed)
Bed: [id number] Expected date:  Expected time:  Means of arrival:  Comments: [id number]

## 2015-10-25 NOTE — ED Triage Notes (Signed)
Pt reports he has had thought of hurting himself and others and would like help. Pt states he has depression which has become worse due to neck, back pain and complications from being intubated in July,

## 2015-10-26 LAB — CBG MONITORING, ED: Glucose-Capillary: 125 mg/dL — ABNORMAL HIGH (ref 65–99)

## 2015-10-26 NOTE — [facility] Suicide Risk Assessment (Signed)
Suicide Risk Assessment  Discharge Assessment   [facility]   Principal Problem: Polysubstance dependence including opioid type drug, episodic abuse [facility](HCC) Discharge Diagnoses:  Patient Active Problem List   Diagnosis Date Noted  . Polysubstance dependence including opioid type drug, episodic abuse (HCC) [F11.20, F19.20] 09/04/2015    Priority: High  . Chronic pain syndrome [G89.4] 10/22/2015  . Healthcare maintenance [Z00.00] 10/22/2015  . Hoarse voice quality [R49.0] 10/11/2015  . Major depressive disorder, recurrent severe without psychotic features (HCC) [F33.2] 09/04/2015  . RUQ abdominal pain [R10.11]   . Cholelithiasis [K80.20] 05/17/2015  . Antisocial personality disorder [F60.2] 04/11/2015  . Opioid use disorder, severe, dependence (HCC) [F11.20] 04/11/2015  . Benzodiazepine dependence (HCC) [F13.20] 04/11/2015  . Tobacco use disorder [F17.200] 04/10/2015  . Narcotic dependency, continuous (HCC) [F11.20] 03/14/2014  . Chronic back pain greater than 3 months duration [M54.9, G89.29] 01/28/2014  . Gastroesophageal reflux disease with esophagitis [K21.0] 01/28/2014  . Essential hypertension, benign [I10] 01/28/2014  . Diabetes mellitus type II, controlled (HCC) [E11.9] 01/28/2014  . Morbid obesity (HCC) [E66.01] 01/28/2014  . TIA (transient ischemic attack) [G45.9] 09/02/2013  . HLD (hyperlipidemia) [E78.5] 08/06/2013  . OSA on CPAP [G47.33] 08/06/2013    Total Time spent with patient: 45 minutes  Musculoskeletal: Strength & Muscle Tone: within normal limits Gait & Station: normal Patient leans: N/A  Psychiatric Specialty Exam: Physical Exam  Constitutional: He is oriented to person, place, and time. He appears well-developed and well-nourished.  HENT:  Head: Normocephalic.  Neck: Normal range of motion.  Respiratory: Effort normal.  Musculoskeletal: Normal range of motion.  Neurological: He is alert and oriented to person, place, and  time.  Skin: Skin is warm and dry.  Psychiatric: His speech is normal and behavior is normal. Judgment and thought content normal. His affect is angry. Cognition and memory are normal.    Review of Systems  Constitutional: Negative.   HENT: Negative.   Eyes: Negative.   Respiratory: Negative.   Cardiovascular: Negative.   Gastrointestinal: Negative.   Genitourinary: Negative.   Musculoskeletal: Negative.   Skin: Negative.   Neurological: Negative.   Endo/Heme/Allergies: Negative.   Psychiatric/Behavioral: Positive for substance abuse.    Blood pressure 151/85, pulse 76, temperature 97.8 F (36.6 C), temperature source Oral, resp. rate 18, SpO2 99 %.There is no height or weight on file to calculate BMI.  General Appearance: Casual  Eye Contact:  Good  Speech:  Normal Rate  Volume:  Increased  Mood:  Angry and Irritable  Affect:  Congruent  Thought Process:  Coherent and Descriptions of Associations: Intact  Orientation:  Full (Time, Place, and Person)  Thought Content:  WDL  Suicidal Thoughts:  No  Homicidal Thoughts:  No  Memory:  Immediate;   Good Recent;   Good Remote;   Good  Judgement:  Fair  Insight:  Lacking  Psychomotor Activity:  Normal  Concentration:  Concentration: Good and Attention Span: Good  Recall:  Good  Fund of Knowledge:  Fair  Language:  Good  Akathisia:  No  Handed:  Right  AIMS (if indicated):     Assets:  Housing Leisure Time Physical Health Resilience Social Support  ADL's:  Intact  Cognition:  WNL  Sleep:       Mental Status Per Nursing Assessment::   On Admission:   suicidal/homicidal ideations  Demographic Factors:  Male and Caucasian  Loss Factors: Legal issues  Historical Factors: NA  Risk Reduction Factors:   Sense of responsibility  to family, Living with another person, especially a relative and Positive social support  Continued Clinical Symptoms:  None   Cognitive Features That Contribute To Risk:  None     Suicide Risk:  Minimal: No identifiable suicidal ideation.  Patients presenting with no risk factors but with morbid ruminations; may be classified as minimal risk based on the severity of the depressive symptoms    Plan Of Care/Follow-up recommendations:  Activity:  as tolerated Diet:  heart healthy diet  LORD, JAMISON, NP 10/26/2015, 11:24 AM

## 2015-10-26 NOTE — Discharge Instructions (Signed)
Chemical Dependency °Chemical dependency is an addiction to drugs or alcohol. It is characterized by the repeated behavior of seeking out and using drugs and alcohol despite harmful consequences to the health and safety of ones self and others.  °RISK FACTORS °There are certain situations or behaviors that increase a person's risk for chemical dependency. These include: °· A family history of chemical dependency. °· A history of mental health issues, including depression and anxiety. °· A home environment where drugs and alcohol are easily available to you. °· Drug or alcohol use at a young age. °SYMPTOMS  °The following symptoms can indicate chemical dependency: °· Inability to limit the use of drugs or alcohol. °· Nausea, sweating, shakiness, and anxiety that occurs when alcohol or drugs are not being used. °· An increase in amount of drugs or alcohol that is necessary to get drunk or high. °People who experience these symptoms can assess their use of drugs and alcohol by asking themselves the following questions: °· Have you been told by friends or family that they are worried about your use of alcohol or drugs? °· Do friends and family ever tell you about things you did while drinking alcohol or using drugs that you do not remember? °· Do you lie about using alcohol or drugs or about the amounts you use? °· Do you have difficulty completing daily tasks unless you use alcohol or drugs? °· Is the level of your work or school performance lower because of your drug or alcohol use? °· Do you get sick from using drugs or alcohol but keep using anyway? °· Do you feel uncomfortable in social situations unless you use alcohol or drugs? °· Do you use drugs or alcohol to help forget problems?  °An answer of yes to any of these questions may indicate chemical dependency. Professional evaluation is suggested. °  °This information is not intended to replace advice given to you by your health care provider. Make sure you  discuss any questions you have with your health care provider. °  °Document Released: 02/09/2001 Document Revised: 05/10/2011 Document Reviewed: 04/23/2010 °[employer]vier Interactive Patient Education ©2016 [employer] ° °

## 2015-10-26 NOTE — Consult Note (Signed)
[place] [facility] Consult   Reason for Consult:  Suicidal and homicidal ideations Referring Physician:  EDP Patient Identification: Bryan Gallegos MRN:  [id number] Principal Diagnosis: polysubstance dependence including opioid type drug Diagnosis:   Patient Active Problem List   Diagnosis Date Noted  . Polysubstance dependence including opioid type drug, episodic abuse ([place]) [F11.20, F19.20] 09/04/2015    Priority: High  . Chronic pain syndrome [G89.4] 10/22/2015  . Healthcare maintenance [Z00.00] 10/22/2015  . Hoarse voice quality [R49.0] 10/11/2015  . Major depressive disorder, recurrent severe without psychotic features ([place]) [F33.2] 09/04/2015  . RUQ abdominal pain [R10.11]   . Cholelithiasis [K80.20] 05/17/2015  . Antisocial personality disorder [F60.2] 04/11/2015  . Opioid use disorder, severe, dependence ([place]) [F11.20] 04/11/2015  . Benzodiazepine dependence ([place]) [F13.20] 04/11/2015  . Tobacco use disorder [F17.200] 04/10/2015  . Narcotic dependency, continuous ([place]) [F11.20] 03/14/2014  . Chronic back pain greater than 3 months duration [M54.9, G89.29] 01/28/2014  . Gastroesophageal reflux disease with esophagitis [K21.0] 01/28/2014  . Essential hypertension, benign [I10] 01/28/2014  . Diabetes mellitus type II, controlled ([place]) [E11.9] 01/28/2014  . Morbid obesity ([place]) [E66.01] 01/28/2014  . TIA (transient ischemic attack) [G45.9] 09/02/2013  . HLD (hyperlipidemia) [E78.5] 08/06/2013  . OSA on CPAP [G47.33] 08/06/2013    Total Time spent with patient: 45 minutes  Subjective:   Bryan Gallegos is a 40 y.o. male patient does not warrant admission.  HPI:  40 yo male who presented to the ED initially for suicidal/homicidal ideations (no plan) and neck/back pain.  He was initially started on opiates and his reported Xanax 2 mg TID but cancelled when he had no Rx for these nor a need.  Ambulating and throwing things this morning without issues or noted signs  of pain when he was not going to get his Xanax and refused the Vistaril.  When psychiatric providers entered the room, he called "you all are a bunch of dick heads."  Angry he is not getting his benzos and opiates.  Six charges for drug possession (methamphetamine possession x 2, drug paraphernalia, marijuana possession, etc.).  He is on probation for charges of selling and possessing drugs.  Denies suicidal ideations and homicidal ideations at this time, discharged.  Past Psychiatric History: substance abuse, depression  Risk to Self: Suicidal Ideation: Yes-Currently Present Suicidal Intent: No Is patient at risk for suicide?: Yes Suicidal Plan?: No Specify Current Suicidal Plan: Denies Access to Means: No Specify Access to Suicidal Means: Denies What has been your use of drugs/alcohol within the last 12 months?: Denies How many times?:  (7-8 last time one month ago admitted to [facility]) Other Self Harm Risks: Burns Triggers for Past Attempts: Other (Comment) ("I can't deal with my family members being sick and stuff" ) Intentional Self Injurious Behavior: Burning Comment - Self Injurious Behavior: Burn self with "whatever" last month Risk to Others: Homicidal Ideation: No Thoughts of Harm to Others: No Current Homicidal Intent: No Current Homicidal Plan: No Access to Homicidal Means: No Identified Victim: Denies History of harm to others?: No Assessment of Violence: None Noted Violent Behavior Description: Denies Does patient have access to weapons?: No Criminal Charges Pending?: No Describe Pending Criminal Charges: Denies Does patient have a court date: No Prior Inpatient Therapy: Prior Inpatient Therapy: Yes Prior Therapy Dates: Multiple Prior Therapy Facilty/Provider(s): [facility], [facility], [place], [place] Reason for Treatment: SI Prior Outpatient Therapy: Prior Outpatient Therapy: Yes Prior Therapy Dates: "about five years" ago Prior Therapy Facilty/Provider(s): [facility]  Reason for Treatment: Depression Does patient have an ACCT team?: No Does patient have Intensive In-House Services?  : No Does patient have [facility] services? : No Does patient have P4CC services?: No  Past Medical History:  Past Medical History:  Diagnosis Date  . Adult ADHD   . Anaphylactic reaction   . Anxiety   . Asthma   . Bipolar disorder ([employer])   . Complication of anesthesia    Per patient difficult intubation;  . Diabetes mellitus   . Difficult intubation    Per patient  . Heart valve disorder    s/p echocardiogram  . Hyperlipidemia   . Hypertension   . Morbidly obese ([place])   . OSA (obstructive sleep apnea)   . Transient cerebral ischemia    Unknown    Past Surgical History:  Procedure Laterality Date  . CARPAL TUNNEL RELEASE    . ESOPHAGOGASTRODUODENOSCOPY N/A 04/05/2015   Procedure: ESOPHAGOGASTRODUODENOSCOPY (EGD);  Surgeon: Rogene Houston, MD;  Location[facility];  Service: Endoscopy;  Laterality: N/A;  . ESOPHAGOGASTRODUODENOSCOPY (EGD) WITH PROPOFOL N/A 05/21/2015   Procedure: ESOPHAGOGASTRODUODENOSCOPY (EGD) WITH PROPOFOL;  Surgeon: Ladene Artist, MD;  Location: [facility];  Service: Endoscopy;  Laterality: N/A;  . NOSE SURGERY    . TOOTH EXTRACTION    . WISDOM TOOTH EXTRACTION     Family History:  Family History  Problem Relation Age of Onset  . CAD Mother     Living  . Diabetes Mellitus II Mother   . Stroke Mother   . Hypertension Mother   . Congestive Heart Failure Mother   . Kidney disease Mother   . Fibromyalgia Mother   . Thyroid disease Mother   . Hyperlipidemia Mother   . Liver disease Mother   . Alcoholism Father 24    Deceased  . Arthritis Maternal Grandmother   . Congestive Heart Failure Maternal Grandmother   . Hypertension Maternal Grandmother   . Lung cancer Maternal Grandfather   . Colon cancer Maternal Aunt   . Stomach cancer Maternal Aunt   . Heart disease Other     Paternal & Maternal  . Crohn's disease Other    . Hypertension Other     Paternal & Maternal  . Hypertension Brother     x3  . Hypertension Sister     #1  . Bipolar disorder Sister     #1  . ADD / ADHD Son     x3  . Bipolar disorder Son     x3  . Asperger's syndrome Son    Family Psychiatric  History: none Social History:  History  Alcohol Use No     History  Drug Use No    Social History   Social History  . Marital status: Single    Spouse name: N/A  . Number of children: N/A  . Years of education: N/A   Social History Main Topics  . Smoking status: Current Every Day Smoker    Packs/day: 0.50    Years: 23.00    Types: Cigarettes  . Smokeless tobacco: Never Used  . Alcohol use No  . Drug use: No  . Sexual activity: Not Currently    Partners: Female    Birth control/ protection: None   Other Topics Concern  . None   Social History Narrative  . None   Additional Social History:    Allergies:   Allergies  Allergen Reactions  . Atenolol Anaphylaxis  . Lisinopril Anaphylaxis    NO ACE  INHIBITORS!!!  . Prednisone Hives  . Tylenol [Acetaminophen] Other (See Comments)    GI Bleed    Labs:  Results for orders placed or performed during the hospital encounter of 10/25/15 (from the past 48 hour(s))  Rapid urine drug screen (hospital performed)     Status: Abnormal   Collection Time: 10/25/15 12:34 PM  Result Value Ref Range   Opiates POSITIVE (A) NONE DETECTED   Cocaine NONE DETECTED NONE DETECTED   Benzodiazepines POSITIVE (A) NONE DETECTED   Amphetamines NONE DETECTED NONE DETECTED   Tetrahydrocannabinol NONE DETECTED NONE DETECTED   Barbiturates NONE DETECTED NONE DETECTED    Comment:        DRUG SCREEN FOR MEDICAL PURPOSES ONLY.  IF CONFIRMATION IS NEEDED FOR ANY PURPOSE, NOTIFY LAB WITHIN 5 DAYS.        LOWEST DETECTABLE LIMITS FOR URINE DRUG SCREEN Drug Class       Cutoff (ng/mL) Amphetamine      1000 Barbiturate      200 Benzodiazepine   161 Tricyclics       096 Opiates           300 Cocaine          300 THC              50   Comprehensive metabolic panel     Status: Abnormal   Collection Time: 10/25/15  1:44 PM  Result Value Ref Range   Sodium 137 135 - 145 mmol/L   Potassium 4.0 3.5 - 5.1 mmol/L   Chloride 100 (L) 101 - 111 mmol/L   CO2 29 22 - 32 mmol/L   Glucose, Bld 172 (H) 65 - 99 mg/dL   BUN 10 6 - 20 mg/dL   Creatinine, Ser 1.09 0.61 - 1.24 mg/dL   Calcium 8.7 (L) 8.9 - 10.3 mg/dL   Total Protein 7.0 6.5 - 8.1 g/dL   Albumin 3.5 3.5 - 5.0 g/dL   AST 27 15 - 41 U/L   ALT 37 17 - 63 U/L   Alkaline Phosphatase 44 38 - 126 U/L   Total Bilirubin 0.6 0.3 - 1.2 mg/dL   GFR calc non Af Amer >60 >60 mL/min   GFR calc Af Amer >60 >60 mL/min    Comment: (NOTE) The eGFR has been calculated using the CKD EPI equation. This calculation has not been validated in all clinical situations. eGFR's persistently <60 mL/min signify possible Chronic Kidney Disease.    Anion gap 8 5 - 15  cbc     Status: None   Collection Time: 10/25/15  1:44 PM  Result Value Ref Range   WBC 6.8 4.0 - 10.5 K/uL   RBC 4.41 4.22 - 5.81 MIL/uL   Hemoglobin 13.6 13.0 - 17.0 g/dL   HCT 40.7 39.0 - 52.0 %   MCV 92.3 78.0 - 100.0 fL   MCH 30.8 26.0 - 34.0 pg   MCHC 33.4 30.0 - 36.0 g/dL   RDW 14.3 11.5 - 15.5 %   Platelets 258 150 - 400 K/uL  Ethanol     Status: None   Collection Time: 10/25/15  1:44 PM  Result Value Ref Range   Alcohol, Ethyl (B) <5 <5 mg/dL    Comment:        LOWEST DETECTABLE LIMIT FOR SERUM ALCOHOL IS 5 mg/dL FOR MEDICAL PURPOSES ONLY   CBG monitoring, ED     Status: Abnormal   Collection Time: 10/25/15  9:57 PM  Result Value Ref Range  Glucose-Capillary 147 (H) 65 - 99 mg/dL  CBG monitoring, ED     Status: Abnormal   Collection Time: 10/26/15  8:14 AM  Result Value Ref Range   Glucose-Capillary 125 (H) 65 - 99 mg/dL    Current Facility-Administered Medications  Medication Dose Route Frequency Provider Last Rate Last Dose  . acetaminophen-codeine  (TYLENOL #3) 300-30 MG per tablet 1 tablet  1 tablet Oral Q4H PRN Davonna Belling, MD   1 tablet at 10/26/15 0857  . albuterol (PROVENTIL HFA;VENTOLIN HFA) 108 (90 Base) MCG/ACT inhaler 2 puff  2 puff Inhalation Q2H PRN Davonna Belling, MD      . citalopram (CELEXA) tablet 20 mg  20 mg Oral Claudie Revering, MD   20 mg at 10/26/15 4562  . cloNIDine (CATAPRES) tablet 0.1 mg  0.1 mg Oral QID Patrecia Pour, NP   0.1 mg at 10/25/15 2242   Followed by  . [START ON 10/28/2015] cloNIDine (CATAPRES) tablet 0.1 mg  0.1 mg Oral BH-qamhs Patrecia Pour, NP       Followed by  . [START ON 10/30/2015] cloNIDine (CATAPRES) tablet 0.1 mg  0.1 mg Oral QAC breakfast Patrecia Pour, NP      . cloNIDine (CATAPRES) tablet 0.2 mg  0.2 mg Oral BID Davonna Belling, MD   0.2 mg at 10/26/15 1000  . dicyclomine (BENTYL) tablet 20 mg  20 mg Oral Q6H PRN Patrecia Pour, NP      . divalproex (DEPAKOTE) DR tablet 500 mg  500 mg Oral BID Davonna Belling, MD   500 mg at 10/26/15 0955  . gabapentin (NEURONTIN) capsule 800 mg  800 mg Oral TID Davonna Belling, MD   800 mg at 10/26/15 0959  . hydrOXYzine (ATARAX/VISTARIL) tablet 25 mg  25 mg Oral Q6H PRN Patrecia Pour, NP   25 mg at 10/26/15 0959  . insulin aspart (novoLOG) injection 0-15 Units  0-15 Units Subcutaneous TID WC Davonna Belling, MD      . insulin glargine (LANTUS) injection 20 Units  20 Units Subcutaneous QHS Davonna Belling, MD   20 Units at 10/25/15 2247  . loperamide (IMODIUM) capsule 2-4 mg  2-4 mg Oral PRN Patrecia Pour, NP      . LORazepam (ATIVAN) tablet 1 mg  1 mg Oral Q6H PRN Patrecia Pour, NP   1 mg at 10/25/15 2313  . metFORMIN (GLUCOPHAGE) tablet 1,000 mg  1,000 mg Oral BID Davonna Belling, MD   1,000 mg at 10/26/15 0956  . methocarbamol (ROBAXIN) tablet 500 mg  500 mg Oral Q8H PRN Patrecia Pour, NP   500 mg at 10/25/15 1826  . naproxen (NAPROSYN) tablet 500 mg  500 mg Oral BID PRN Patrecia Pour, NP   500 mg at 10/25/15 1826  .  ondansetron (ZOFRAN-ODT) disintegrating tablet 4 mg  4 mg Oral Q6H PRN Patrecia Pour, NP       Current Outpatient Prescriptions  Medication Sig Dispense Refill  . acetaminophen-codeine (TYLENOL #3) 300-30 MG tablet Take 1 tablet by mouth every 4 (four) hours as needed. (Patient taking differently: Take 1 tablet by mouth every 4 (four) hours as needed for moderate pain. ) 90 tablet 0  . albuterol (PROVENTIL HFA;VENTOLIN HFA) 108 (90 Base) MCG/ACT inhaler Inhale 2 puffs into the lungs every 2 (two) hours as needed for wheezing or shortness of breath. 1 Inhaler 4  . alprazolam (XANAX) 2 MG tablet Take 2 mg by mouth 3 (three) times daily  as needed for sleep or anxiety.    Marland Kitchen amphetamine-dextroamphetamine (ADDERALL) 30 MG tablet Take 60 mg by mouth daily.     Marland Kitchen aspirin 325 MG tablet Take 1 tablet (325 mg total) by mouth daily. For heart health 30 tablet 0  . blood glucose meter kit and supplies KIT Dispense based on patient and insurance preference. Use up to four times daily as directed. (FOR ICD-9 250.00, 250.01). 1 each 0  . Blood Glucose Monitoring Suppl (ACCU-CHEK AVIVA PLUS) w/Device KIT 1 each by Does not apply route 4 (four) times daily - after meals and at bedtime. 1 kit 0  . citalopram (CELEXA) 20 MG tablet Take 1 tablet (20 mg total) by mouth every morning. 30 tablet 6  . clonazePAM (KLONOPIN) 1 MG tablet Take 1 mg by mouth 2 (two) times daily.    . cloNIDine (CATAPRES) 0.1 MG tablet Take 2 tablets (0.2 mg total) by mouth 2 (two) times daily. 90 tablet 3  . divalproex (DEPAKOTE) 500 MG DR tablet Take 500 mg by mouth 2 (two) times daily.  0  . gabapentin (NEURONTIN) 400 MG capsule Take 2 capsules (800 mg total) by mouth 3 (three) times daily. 180 capsule 4  . glucose blood (ACCU-CHEK AVIVA) test strip Use as instructed 100 each 12  . ibuprofen (ADVIL,MOTRIN) 200 MG tablet Take 400-600 mg by mouth every 6 (six) hours as needed for mild pain or moderate pain.    Marland Kitchen insulin glargine (LANTUS) 100  UNIT/ML injection Inject 0.2 mLs (20 Units total) into the skin at bedtime. 10 mL 6  . Lancet Devices (ACCU-CHEK SOFTCLIX) lancets Use as instructed 1 each 0  . metFORMIN (GLUCOPHAGE) 1000 MG tablet Take 1 tablet (1,000 mg total) by mouth 2 (two) times daily. 60 tablet 6  . omeprazole (PRILOSEC) 40 MG capsule Take 1 capsule (40 mg total) by mouth 2 (two) times daily. 60 capsule 6  . oxycodone (ROXICODONE) 30 MG immediate release tablet Take 30 mg by mouth every 4 (four) hours as needed for pain.    . ranitidine (ZANTAC) 300 MG tablet Take 1 tablet (300 mg total) by mouth at bedtime. 30 tablet 6  . traZODone (DESYREL) 50 MG tablet Take 1 tablet (50 mg total) by mouth at bedtime. For sleep 30 tablet 6    Musculoskeletal: Strength & Muscle Tone: within normal limits Gait & Station: normal Patient leans: N/A  Psychiatric Specialty Exam: Physical Exam  Constitutional: He is oriented to person, place, and time. He appears well-developed and well-nourished.  HENT:  Head: Normocephalic.  Neck: Normal range of motion.  Respiratory: Effort normal.  Musculoskeletal: Normal range of motion.  Neurological: He is alert and oriented to person, place, and time.  Skin: Skin is warm and dry.  Psychiatric: His speech is normal and behavior is normal. Judgment and thought content normal. His affect is angry. Cognition and memory are normal.    Review of Systems  Constitutional: Negative.   HENT: Negative.   Eyes: Negative.   Respiratory: Negative.   Cardiovascular: Negative.   Gastrointestinal: Negative.   Genitourinary: Negative.   Musculoskeletal: Negative.   Skin: Negative.   Neurological: Negative.   Endo/Heme/Allergies: Negative.   Psychiatric/Behavioral: Positive for substance abuse.    Blood pressure 151/85, pulse 76, temperature 97.8 F (36.6 C), temperature source Oral, resp. rate 18, SpO2 99 %.There is no height or weight on file to calculate BMI.  General Appearance: Casual  Eye  Contact:  Good  Speech:  Normal Rate  Volume:  Increased  Mood:  Angry and Irritable  Affect:  Congruent  Thought Process:  Coherent and Descriptions of Associations: Intact  Orientation:  Full (Time, Place, and Person)  Thought Content:  WDL  Suicidal Thoughts:  No  Homicidal Thoughts:  No  Memory:  Immediate;   Good Recent;   Good Remote;   Good  Judgement:  Fair  Insight:  Lacking  Psychomotor Activity:  Normal  Concentration:  Concentration: Good and Attention Span: Good  Recall:  Good  Fund of Knowledge:  Fair  Language:  Good  Akathisia:  No  Handed:  Right  AIMS (if indicated):     Assets:  Housing Leisure Time Physical Health Resilience Social Support  ADL's:  Intact  Cognition:  WNL  Sleep:        Treatment Plan Summary: Daily contact with patient to assess and evaluate symptoms and progress in treatment, Medication management and Plan  polysubstance dependence including opioid type drug: -Crisis stabilization -Medication management:  Clonidine opiate withdrawal protocol started along with his other medical medications except his opiates and benzodiazepines due to no Rx, Ativan 1 mg every six hours PRN only for withdrawal symptoms during hospitalization -Individual and substance abuse counseling  Disposition: No evidence of imminent risk to self or others at present.    Waylan Boga, NP 10/26/2015 10:11 AM  Patient seen face-to-face for psychiatric evaluation, chart reviewed and case discussed with the physician extender and developed treatment plan. Reviewed the information documented and agree with the treatment plan. Corena Pilgrim, MD

## 2015-10-26 NOTE — ED Notes (Signed)
Patient was upset because he was not given all his home night time meds. Writer spoke with provider and then explained to patient that records show he does not have active prescriptions for Xanax and Oxycodone, and that his trazodone will have to be addressed on day shift with the psych dr.

## 2015-10-26 NOTE — ED Notes (Signed)
Patient refused insulin this morning, stating he takes Metformin to manage his DM.

## 2015-10-26 NOTE — ED Notes (Signed)
Patient became agitated when he did not get Ativan.  Vistaril was prescribe and offered to patient, but he refused it and angrily began throwing things around the room, including the bedside table.  Patient was evaluated by Molli KnockJ. Lord, NP and Dr. Jannifer FranklinAkintayo.  They stated not to give Ativan in response to patient's behavior and he has follow-up in outpatient that he can see.  Patient was discharged and ambulated to exit independently.

## 2015-10-27 ENCOUNTER — Other Ambulatory Visit: Payer: Self-pay

## 2015-10-27 DIAGNOSIS — J4530 Mild persistent asthma, uncomplicated: Secondary | ICD-10-CM

## 2015-10-27 MED ORDER — ALBUTEROL SULFATE HFA 108 (90 BASE) MCG/ACT IN AERS: 2.0000 | INHALATION_SPRAY | RESPIRATORY_TRACT | 3 refills | Status: DC | PRN

## 2015-10-30 LAB — CULTURE, BLOOD (ROUTINE X 2)
Culture: NO GROWTH
Culture: NO GROWTH

## 2015-11-07 ENCOUNTER — Telehealth: Payer: Self-pay | Admitting: Internal Medicine

## 2015-11-07 DIAGNOSIS — G4733 Obstructive sleep apnea (adult) (pediatric): Secondary | ICD-10-CM

## 2015-11-07 DIAGNOSIS — Z9989 Dependence on other enabling machines and devices: Principal | ICD-10-CM

## 2015-11-07 NOTE — Telephone Encounter (Signed)
[facility] stating that the order for the pt for the Cpap Titration needs to be changed to a split night study due to pt not having a previous study on file  Contact at [phone]

## 2015-11-09 NOTE — Telephone Encounter (Signed)
Ok for split night study

## 2015-11-10 ENCOUNTER — Other Ambulatory Visit: Payer: Self-pay | Admitting: Pharmacist

## 2015-11-10 ENCOUNTER — Telehealth: Payer: Self-pay | Admitting: General Practice

## 2015-11-10 MED ORDER — ACCU-CHEK SOFTCLIX LANCET DEV MISC: 0 refills | Status: DC

## 2015-11-10 MED ORDER — ACCU-CHEK AVIVA PLUS W/DEVICE KIT: PACK | 0 refills | Status: DC

## 2015-11-10 MED ORDER — ACCU-CHEK SOFTCLIX LANCETS MISC: 12 refills | Status: DC

## 2015-11-10 MED ORDER — GLUCOSE BLOOD VI STRP: ORAL_STRIP | 12 refills | Status: DC

## 2015-11-10 NOTE — Telephone Encounter (Signed)
Blood glucose meter and supplies resent to [facility] and specific instructions and ICD 10 code.

## 2015-11-10 NOTE — Telephone Encounter (Signed)
Pt. Came into facility stating that [facility] needs a code b/c pt. Has medicare and part B needs the code to cover it.

## 2015-11-10 NOTE — Telephone Encounter (Signed)
[facility] will contact the patient with appointment time and date.

## 2015-11-17 ENCOUNTER — Telehealth: Payer: Self-pay | Admitting: Internal Medicine

## 2015-11-17 NOTE — Telephone Encounter (Signed)
Medication Refill:  °acetaminophen-codeine (TYLENOL #3) 300-30 MG tablet °

## 2015-11-19 ENCOUNTER — Encounter (HOSPITAL_COMMUNITY): Payer: Self-pay | Admitting: Emergency Medicine

## 2015-11-19 ENCOUNTER — Other Ambulatory Visit: Payer: Self-pay | Admitting: Internal Medicine

## 2015-11-19 ENCOUNTER — Emergency Department (HOSPITAL_COMMUNITY): Payer: Medicare Other

## 2015-11-19 ENCOUNTER — Emergency Department (HOSPITAL_COMMUNITY)
Admission: EM | Admit: 2015-11-19 | Discharge: 2015-11-20 | Disposition: A | Discharge status: Home / Self Care | Payer: Medicare Other | Attending: Emergency Medicine | Admitting: Emergency Medicine

## 2015-11-19 DIAGNOSIS — J40 Bronchitis, not specified as acute or chronic: Secondary | ICD-10-CM | POA: Diagnosis not present

## 2015-11-19 DIAGNOSIS — F17210 Nicotine dependence, cigarettes, uncomplicated: Secondary | ICD-10-CM | POA: Insufficient documentation

## 2015-11-19 DIAGNOSIS — Z79899 Other long term (current) drug therapy: Secondary | ICD-10-CM | POA: Insufficient documentation

## 2015-11-19 DIAGNOSIS — Z7984 Long term (current) use of oral hypoglycemic drugs: Secondary | ICD-10-CM | POA: Diagnosis not present

## 2015-11-19 DIAGNOSIS — G894 Chronic pain syndrome: Secondary | ICD-10-CM

## 2015-11-19 DIAGNOSIS — J441 Chronic obstructive pulmonary disease with (acute) exacerbation: Secondary | ICD-10-CM

## 2015-11-19 DIAGNOSIS — E119 Type 2 diabetes mellitus without complications: Secondary | ICD-10-CM | POA: Insufficient documentation

## 2015-11-19 DIAGNOSIS — Z8673 Personal history of transient ischemic attack (TIA), and cerebral infarction without residual deficits: Secondary | ICD-10-CM | POA: Diagnosis not present

## 2015-11-19 DIAGNOSIS — R0602 Shortness of breath: Secondary | ICD-10-CM | POA: Diagnosis present

## 2015-11-19 DIAGNOSIS — I10 Essential (primary) hypertension: Secondary | ICD-10-CM | POA: Diagnosis not present

## 2015-11-19 DIAGNOSIS — Z7982 Long term (current) use of aspirin: Secondary | ICD-10-CM | POA: Insufficient documentation

## 2015-11-19 DIAGNOSIS — Z794 Long term (current) use of insulin: Secondary | ICD-10-CM | POA: Insufficient documentation

## 2015-11-19 LAB — CBC WITH DIFFERENTIAL/PLATELET
BASOS ABS: 0 10*3/uL (ref 0.0–0.1)
BASOS PCT: 0 %
EOS PCT: 2 %
Eosinophils Absolute: 0.2 10*3/uL (ref 0.0–0.7)
HCT: 47.7 % (ref 39.0–52.0)
HEMOGLOBIN: 15.9 g/dL (ref 13.0–17.0)
LYMPHS ABS: 2.4 10*3/uL (ref 0.7–4.0)
Lymphocytes Relative: 22 %
MCH: 30.8 pg (ref 26.0–34.0)
MCHC: 33.3 g/dL (ref 30.0–36.0)
MCV: 92.3 fL (ref 78.0–100.0)
MONOS PCT: 5 %
Monocytes Absolute: 0.6 10*3/uL (ref 0.1–1.0)
NEUTROS ABS: 7.8 10*3/uL — AB (ref 1.7–7.7)
NEUTROS PCT: 71 %
PLATELETS: 209 10*3/uL (ref 150–400)
RBC: 5.17 MIL/uL (ref 4.22–5.81)
RDW: 13.5 % (ref 11.5–15.5)
WBC: 11 10*3/uL — ABNORMAL HIGH (ref 4.0–10.5)

## 2015-11-19 LAB — BASIC METABOLIC PANEL
ANION GAP: 10 (ref 5–15)
BUN: 5 mg/dL — AB (ref 6–20)
CALCIUM: 9.2 mg/dL (ref 8.9–10.3)
CO2: 23 mmol/L (ref 22–32)
CREATININE: 0.7 mg/dL (ref 0.61–1.24)
Chloride: 104 mmol/L (ref 101–111)
GFR calc Af Amer: >60 mL/min (ref >60)
GFR calc non Af Amer: >60 mL/min (ref >60)
GLUCOSE: 106 mg/dL — AB (ref 65–99)
Potassium: 4.2 mmol/L (ref 3.5–5.1)
SODIUM: 137 mmol/L (ref 135–145)

## 2015-11-19 LAB — I-STAT TROPONIN, ED: Troponin i, poc: 0 ng/mL (ref 0.00–0.08)

## 2015-11-19 MED FILL — GABAPENTIN 400 MG CAPSULE: 400 | 30 days supply | Qty: 180 | Fill #1

## 2015-11-19 MED FILL — raNITIdine HCL 150 MG TABS: 150 | 30 days supply | Qty: 60 | Fill #1

## 2015-11-19 MED FILL — traZODone HCL 50 MG TABS: 50 | 30 days supply | Qty: 30 | Fill #1

## 2015-11-19 MED FILL — ?CITALOPRAM HBR 20 MG TABLE: 20 | 30 days supply | Qty: 30 | Fill #1

## 2015-11-19 MED FILL — cloNIDine HCL 0.1 MG TABS: 0.1 | 22 days supply | Qty: 90 | Fill #1

## 2015-11-19 NOTE — ED Provider Notes (Addendum)
[facility] DEPT Provider Note   CSN: [id number] Arrival date & time: 11/19/15  1740     History   Chief Complaint Chief Complaint  Patient presents with  . Shortness of Breath    HPI Bryan Gallegos is a 40 y.o. male.  HPI   Bryan Gallegos is a 40 year old male with extensive PMH including morbid obesity, asthma, OSA, DM, and HTN presents to ED with 3-day history of worsening productive cough which makes him SOB. Cough is constant, productive of yellow and green phlegm. Pt also reports seeing "globs" and "streaks" of blood after coughing. Complains of CP that began after his coughing and is worse with deep breathing and coughing. Also complains of pain in his upper back and shoulders that is worse with movement and coughing. Reports having a hard time catching his breath after coughing and getting SOB with talking and ambulation. Denies LEE, weakness, vision changes, headache, trauma, or fever.   Also complained of several days of dysuria and increased urinary frequency.   Past Medical History:  Diagnosis Date  . Adult ADHD   . Anaphylactic reaction   . Anxiety   . Asthma   . Bipolar disorder ([place])   . Complication of anesthesia    Per patient difficult intubation;  . Diabetes mellitus   . Difficult intubation    Per patient  . Heart valve disorder    s/p echocardiogram  . Hyperlipidemia   . Hypertension   . Morbidly obese ([place])   . OSA (obstructive sleep apnea)   . Transient cerebral ischemia    Unknown    Patient Active Problem List   Diagnosis Date Noted  . Chronic pain syndrome 10/22/2015  . Healthcare maintenance 10/22/2015  . Hoarse voice quality 10/11/2015  . Major depressive disorder, recurrent severe without psychotic features ([place]) 09/04/2015  . Polysubstance dependence including opioid type drug, episodic abuse ([place]) 09/04/2015  . RUQ abdominal pain   . Cholelithiasis 05/17/2015  . Antisocial personality disorder 04/11/2015  . Opioid use disorder,  severe, dependence ([place]) 04/11/2015  . Benzodiazepine dependence ([place]) 04/11/2015  . Tobacco use disorder 04/10/2015  . Narcotic dependency, continuous ([place]) 03/14/2014  . Chronic back pain greater than 3 months duration 01/28/2014  . Gastroesophageal reflux disease with esophagitis 01/28/2014  . Essential hypertension, benign 01/28/2014  . Diabetes mellitus type II, controlled ([place]) 01/28/2014  . Morbid obesity ([place]) 01/28/2014  . TIA (transient ischemic attack) 09/02/2013  . HLD (hyperlipidemia) 08/06/2013  . OSA on CPAP 08/06/2013    Past Surgical History:  Procedure Laterality Date  . CARPAL TUNNEL RELEASE    . ESOPHAGOGASTRODUODENOSCOPY N/A 04/05/2015   Procedure: ESOPHAGOGASTRODUODENOSCOPY (EGD);  Surgeon: Rogene Houston, MD;  Location: [facility];  Service: Endoscopy;  Laterality: N/A;  . ESOPHAGOGASTRODUODENOSCOPY (EGD) WITH PROPOFOL N/A 05/21/2015   Procedure: ESOPHAGOGASTRODUODENOSCOPY (EGD) WITH PROPOFOL;  Surgeon: Ladene Artist, MD;  Location: [facility] ENDOSCOPY;  Service: Endoscopy;  Laterality: N/A;  . NOSE SURGERY    . TOOTH EXTRACTION    . WISDOM TOOTH EXTRACTION         Home Medications    Prior to Admission medications   Medication Sig Start Date End Date Taking? Authorizing Provider  acetaminophen-codeine (TYLENOL #3) 300-30 MG tablet TAKE 1 TABLET BY MOUTH EVERY 4 HOURS AS NEEDED 11/19/15  Yes Olugbemiga E Doreene Burke, MD  albuterol (PROVENTIL HFA;VENTOLIN HFA) 108 (90 Base) MCG/ACT inhaler Inhale 2 puffs into the lungs every 2 (two) hours as needed for wheezing or shortness of breath.  10/27/15  Yes Arnoldo Morale, MD  alprazolam Duanne Moron) 2 MG tablet Take 2 mg by mouth 3 (three) times daily as needed for sleep or anxiety.   Yes Historical Provider, MD  amphetamine-dextroamphetamine (ADDERALL) 30 MG tablet Take 60 mg by mouth daily.    Yes Historical Provider, MD  aspirin 325 MG tablet Take 1 tablet (325 mg total) by mouth daily. For heart health 09/05/15  Yes Encarnacion Slates,  NP  citalopram (CELEXA) 20 MG tablet Take 1 tablet (20 mg total) by mouth every morning. 10/15/15  Yes Elsie Stain, MD  clonazePAM (KLONOPIN) 1 MG tablet Take 1 mg by mouth 2 (two) times daily.   Yes Historical Provider, MD  cloNIDine (CATAPRES) 0.1 MG tablet Take 2 tablets (0.2 mg total) by mouth 2 (two) times daily. 10/22/15  Yes Tresa Garter, MD  divalproex (DEPAKOTE) 500 MG DR tablet Take 500 mg by mouth 2 (two) times daily. 10/02/15  Yes Historical Provider, MD  gabapentin (NEURONTIN) 400 MG capsule Take 2 capsules (800 mg total) by mouth 3 (three) times daily. 10/15/15  Yes Elsie Stain, MD  ibuprofen (ADVIL,MOTRIN) 200 MG tablet Take 400-600 mg by mouth every 6 (six) hours as needed for mild pain or moderate pain.   Yes Historical Provider, MD  insulin glargine (LANTUS) 100 UNIT/ML injection Inject 0.2 mLs (20 Units total) into the skin at bedtime. 10/15/15  Yes Elsie Stain, MD  metFORMIN (GLUCOPHAGE) 1000 MG tablet Take 1 tablet (1,000 mg total) by mouth 2 (two) times daily. 10/15/15  Yes Elsie Stain, MD  omeprazole (PRILOSEC) 40 MG capsule Take 1 capsule (40 mg total) by mouth 2 (two) times daily. 10/15/15  Yes Elsie Stain, MD  oxycodone (ROXICODONE) 30 MG immediate release tablet Take 30 mg by mouth every 4 (four) hours as needed for pain.   Yes Historical Provider, MD  ranitidine (ZANTAC) 300 MG tablet Take 1 tablet (300 mg total) by mouth at bedtime. 10/15/15  Yes Elsie Stain, MD  traZODone (DESYREL) 50 MG tablet Take 1 tablet (50 mg total) by mouth at bedtime. For sleep 10/15/15  Yes Elsie Stain, MD  ACCU-CHEK [facility] LANCETS lancets Use as instructed for 3 times daily testing of blood sugar 11/10/15   Tresa Garter, MD  albuterol (PROVENTIL HFA;VENTOLIN HFA) 108 (90 Base) MCG/ACT inhaler Inhale 1-2 puffs into the lungs every 6 (six) hours as needed for wheezing or shortness of breath. 11/20/15   [employer] Carlota Raspberry, PA-C  azithromycin (ZITHROMAX) 250 MG  tablet Take 1 tablet (250 mg total) by mouth daily. Take first 2 tablets together, then 1 every day until finished. 11/20/15   Delos Haring, PA-C  blood glucose meter kit and supplies KIT Dispense based on patient and insurance preference. Use up to four times daily as directed. (FOR ICD-9 250.00, 250.01). 10/13/15   Samuella Cota, MD  Blood Glucose Monitoring Suppl (ACCU-CHEK AVIVA PLUS) w/Device KIT Use as directed to check blood sugar 3 times daily. 11/10/15   Tresa Garter, MD  glucose blood (ACCU-CHEK AVIVA) test strip Use as instructed for 3 times daily testing of blood sugar 11/10/15   Tresa Garter, MD  Lancet Devices [facility]) lancets Use as instructed for 3 times daily testing of blood sugar 11/10/15   Tresa Garter, MD    Family History Family History  Problem Relation Age of Onset  . CAD Mother     Living  . Diabetes Mellitus II Mother   .  Stroke Mother   . Hypertension Mother   . Congestive Heart Failure Mother   . Kidney disease Mother   . Fibromyalgia Mother   . Thyroid disease Mother   . Hyperlipidemia Mother   . Liver disease Mother   . Alcoholism Father 87    Deceased  . Arthritis Maternal Grandmother   . Congestive Heart Failure Maternal Grandmother   . Hypertension Maternal Grandmother   . Lung cancer Maternal Grandfather   . Colon cancer Maternal Aunt   . Stomach cancer Maternal Aunt   . Heart disease Other     Paternal & Maternal  . Crohn's disease Other   . Hypertension Other     Paternal & Maternal  . Hypertension Brother     x3  . Hypertension Sister     #1  . Bipolar disorder Sister     #1  . ADD / ADHD Son     x3  . Bipolar disorder Son     x3  . Asperger's syndrome Son     Social History Social History  Substance Use Topics  . Smoking status: Current Every Day Smoker    Packs/day: 0.50    Years: 23.00    Types: Cigarettes  . Smokeless tobacco: Never Used  . Alcohol use No     Allergies   Atenolol;  Lisinopril; Prednisone; and Tylenol [acetaminophen]   Review of Systems Review of Systems Review of Systems All other systems negative except as documented in the HPI. All pertinent positives and negatives as reviewed in the HPI.   Physical Exam Updated Vital Signs BP 125/78   Pulse 78   Temp 98.1 F (36.7 C) (Oral)   Resp 21   SpO2 99%   Physical Exam  Constitutional: He appears well-developed and well-nourished. No distress.  HENT:  Head: Normocephalic and atraumatic.  Right Ear: Tympanic membrane and ear canal normal.  Left Ear: Tympanic membrane and ear canal normal.  Nose: Nose normal.  Mouth/Throat: Uvula is midline, oropharynx is clear and moist and mucous membranes are normal.  Eyes: Pupils are equal, round, and reactive to light.  Neck: Normal range of motion. Neck supple.  Cardiovascular: Normal rate and regular rhythm.   Pulmonary/Chest: Effort normal.  Coughing on exam.  Abdominal: Soft.  No signs of abdominal distention  Musculoskeletal:  No LE swelling  Neurological: He is alert.  Acting at baseline  Skin: Skin is warm and dry. No rash noted.  Nursing note and vitals reviewed.    ED Treatments / Results  Labs (all labs ordered are listed, but only abnormal results are displayed) Labs Reviewed  BASIC METABOLIC PANEL - Abnormal; Notable for the following:       Result Value   Glucose, Bld 106 (*)    BUN 5 (*)    All other components within normal limits  CBC WITH DIFFERENTIAL/PLATELET - Abnormal; Notable for the following:    WBC 11.0 (*)    Neutro Abs 7.8 (*)    All other components within normal limits  BRAIN NATRIURETIC PEPTIDE - Abnormal; Notable for the following:    B Natriuretic Peptide 148.1 (*)    All other components within normal limits  Randolm Idol, ED  Randolm Idol, ED    EKG  EKG Interpretation  Date/Time:  Wednesday November 19 2015 17:46:18 EDT Ventricular Rate:  87 PR Interval:  138 QRS Duration: 90 QT  Interval:  370 QTC Calculation: 445 R Axis:   88 Text Interpretation:  Normal sinus  rhythm Normal ECG No significant change since last tracing Confirmed by Glynn Octave [phone]) on 11/19/2015 11:44:28 PM       Radiology Dg Chest 2 View  Result Date: 11/19/2015 CLINICAL DATA:  Shortness of breath with chest pain. EXAM: CHEST  2 VIEW COMPARISON:  10/24/2015 FINDINGS: Coarse lung markings appear to be chronic. There is no focal airspace disease or pulmonary edema. Heart and mediastinum are within normal limits. Trachea is midline. No large pleural effusions. No acute bone abnormality. IMPRESSION: No active cardiopulmonary disease. Electronically Signed   By: Markus Daft M.D.   On: 11/19/2015 18:25    Procedures Procedures (including critical care time)  Medications Ordered in ED Medications  albuterol (PROVENTIL) (2.5 MG/3ML) 0.083% nebulizer solution 5 mg (5 mg Nebulization Given 11/20/15 0037)  ipratropium (ATROVENT) nebulizer solution 0.5 mg (0.5 mg Nebulization Given 11/20/15 0037)  ketorolac (TORADOL) injection 60 mg (60 mg Intramuscular Given 11/20/15 0227)  oxyCODONE (Oxy IR/ROXICODONE) immediate release tablet 5 mg (5 mg Oral Given 11/20/15 0227)  azithromycin (ZITHROMAX) tablet 500 mg (500 mg Oral Given 11/20/15 0349)     Initial Impression / Assessment and Plan / ED Course  I have reviewed the triage vital signs and the nursing notes.  Pertinent labs & imaging results that were available during my care of the patient were reviewed by me and considered in my medical decision making (see chart for details).  Clinical Course    Patient monitored in the ED for an extended period of time, he endorses still not feeling well despite treatment but his vital signs are normal and his images and lab results are unremarkable for significant findings. His oxygen saturation is normal. His symptoms are consistent with COPD exacerbation, he is allergic to prednisone.  He has had 2  negative troponins, BNP is 148 and he is without signs of fluid overload, WBC is mildly elevated at 11, BMP is normal, Normal EKG, Normal Chest xray. Patient advised to f/u with PCP and symptoms that warrant return to the ED.    Final Clinical Impressions(s) / ED Diagnoses   Final diagnoses:  COPD exacerbation ([place])  Bronchitis    New Prescriptions Discharge Medication List as of 11/20/2015  3:40 AM    START taking these medications   Details  !! albuterol (PROVENTIL HFA;VENTOLIN HFA) 108 (90 Base) MCG/ACT inhaler Inhale 1-2 puffs into the lungs every 6 (six) hours as needed for wheezing or shortness of breath., Starting Thu 11/20/2015, Print    azithromycin (ZITHROMAX) 250 MG tablet Take 1 tablet (250 mg total) by mouth daily. Take first 2 tablets together, then 1 every day until finished., Starting Thu 11/20/2015, Print     !! - Potential duplicate medications found. Please discuss with provider.       Delos Haring, PA-C 11/21/15 [place], MD 11/21/15 [place], PA-C 12/28/15 2229    Everlene Balls, MD 01/02/16 [phone]

## 2015-11-19 NOTE — ED Notes (Signed)
Called pt for redraw,  Pt not in waiting room.  I will check back in few minutes.  His wife is in waiting room but not sure of his location.

## 2015-11-19 NOTE — ED Triage Notes (Signed)
Pt sts SOB and pain in chest and back generalized worse with inspiration

## 2015-11-20 LAB — I-STAT TROPONIN, ED: Troponin i, poc: 0 ng/mL (ref 0.00–0.08)

## 2015-11-20 LAB — BRAIN NATRIURETIC PEPTIDE: B NATRIURETIC PEPTIDE 5: 148.1 pg/mL — AB (ref 0.0–100.0)

## 2015-11-20 MED ORDER — OXYCODONE HCL 5 MG PO TABS
5.0000 mg | ORAL_TABLET | Freq: Once | ORAL | Status: AC
  Administered 2015-11-20: 5 mg via ORAL
  Filled 2015-11-20: qty 1

## 2015-11-20 MED ORDER — KETOROLAC TROMETHAMINE 60 MG/2ML IM SOLN
60.0000 mg | Freq: Once | INTRAMUSCULAR | Status: AC
  Administered 2015-11-20: 60 mg via INTRAMUSCULAR
  Filled 2015-11-20: qty 2

## 2015-11-20 MED ORDER — ALBUTEROL SULFATE (2.5 MG/3ML) 0.083% IN NEBU
5.0000 mg | INHALATION_SOLUTION | Freq: Once | RESPIRATORY_TRACT | Status: AC
  Administered 2015-11-20: 5 mg via RESPIRATORY_TRACT
  Filled 2015-11-20: qty 6

## 2015-11-20 MED ORDER — IPRATROPIUM BROMIDE 0.02 % IN SOLN
0.5000 mg | Freq: Once | RESPIRATORY_TRACT | Status: AC
  Administered 2015-11-20: 0.5 mg via RESPIRATORY_TRACT
  Filled 2015-11-20: qty 2.5

## 2015-11-20 MED ORDER — ALBUTEROL SULFATE HFA 108 (90 BASE) MCG/ACT IN AERS: 1.0000 | INHALATION_SPRAY | Freq: Four times a day (QID) | RESPIRATORY_TRACT | 0 refills | Status: DC | PRN

## 2015-11-20 MED ORDER — AZITHROMYCIN 250 MG PO TABS
500.0000 mg | ORAL_TABLET | Freq: Once | ORAL | Status: AC
  Administered 2015-11-20: 500 mg via ORAL
  Filled 2015-11-20: qty 2

## 2015-11-20 MED ORDER — AZITHROMYCIN 250 MG PO TABS: 250.0000 mg | ORAL_TABLET | Freq: Every day | ORAL | 0 refills | Status: DC

## 2015-11-21 IMAGING — CR DG KNEE 1-2V PORT*L*
2 series · 2 of 2 positions shown · non-contrast
Comparison: None.

CLINICAL DATA: Status post fall onto both knees, with bilateral
knee pain. Initial encounter.

EXAM:
PORTABLE LEFT KNEE - 1-2 VIEW

[AP]
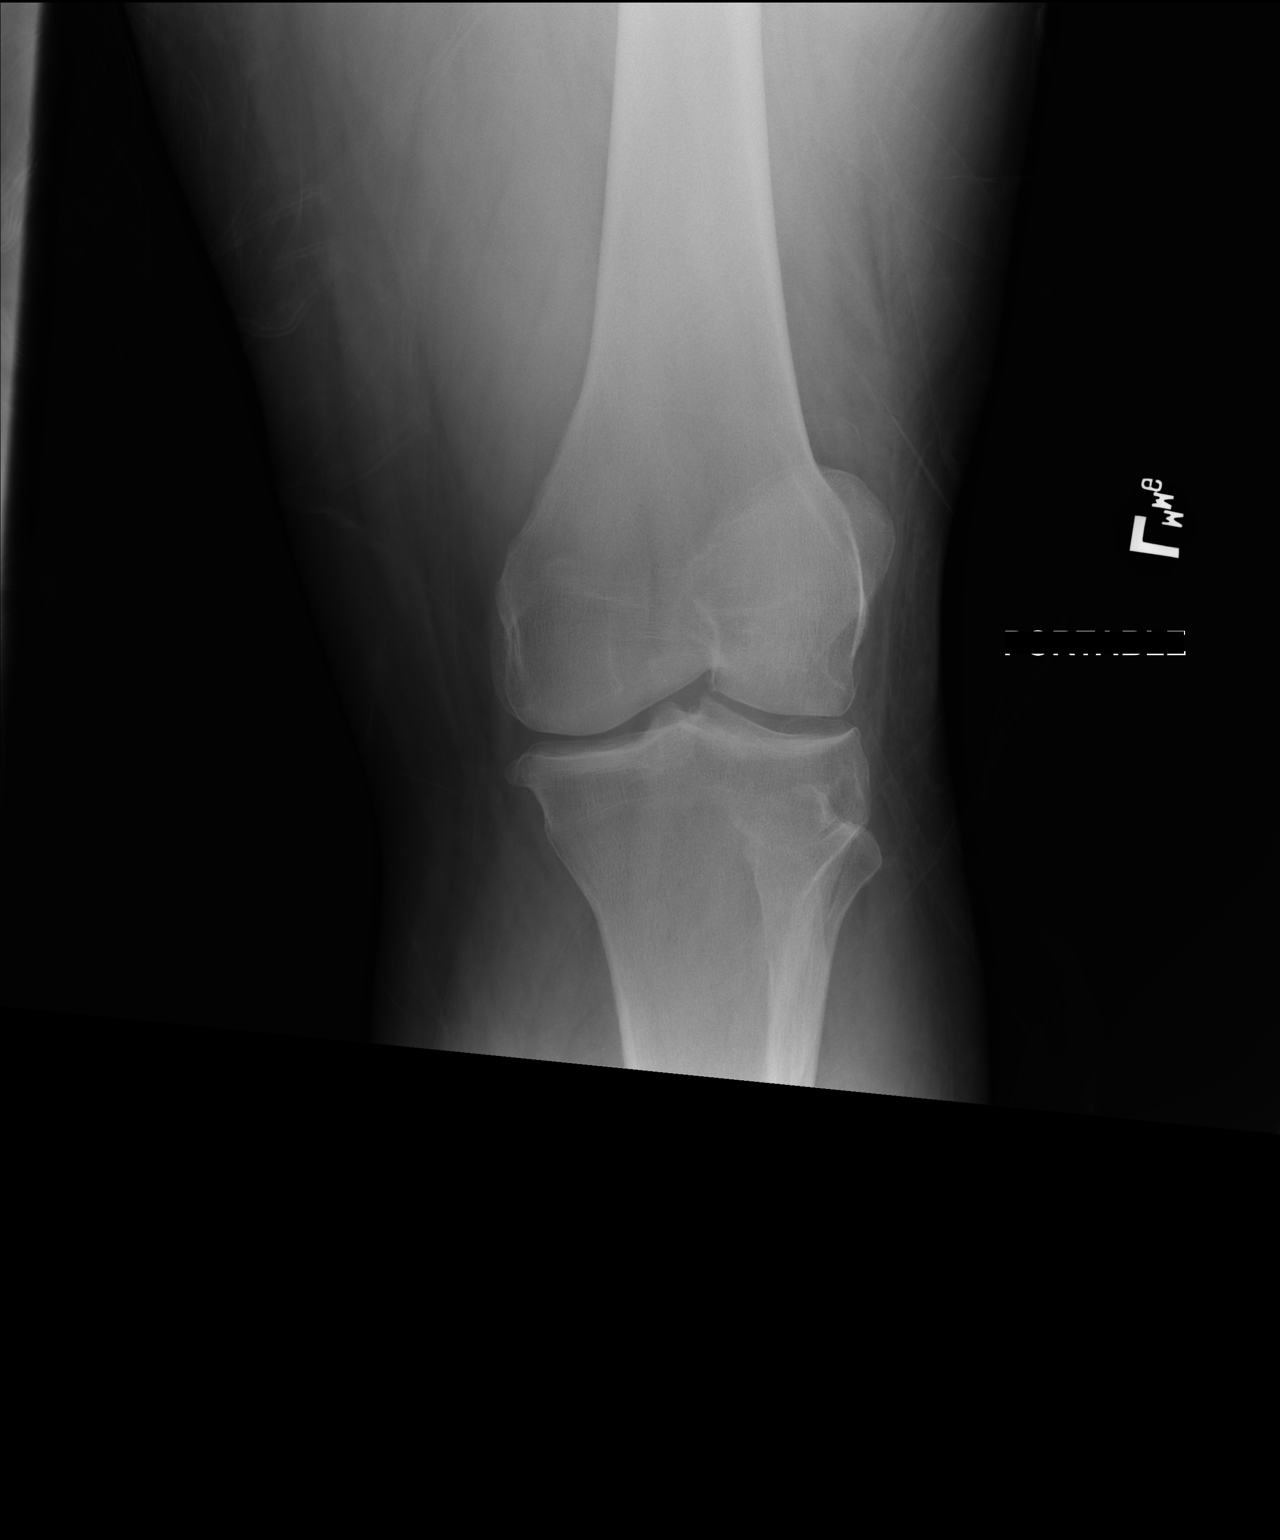

[lateral]
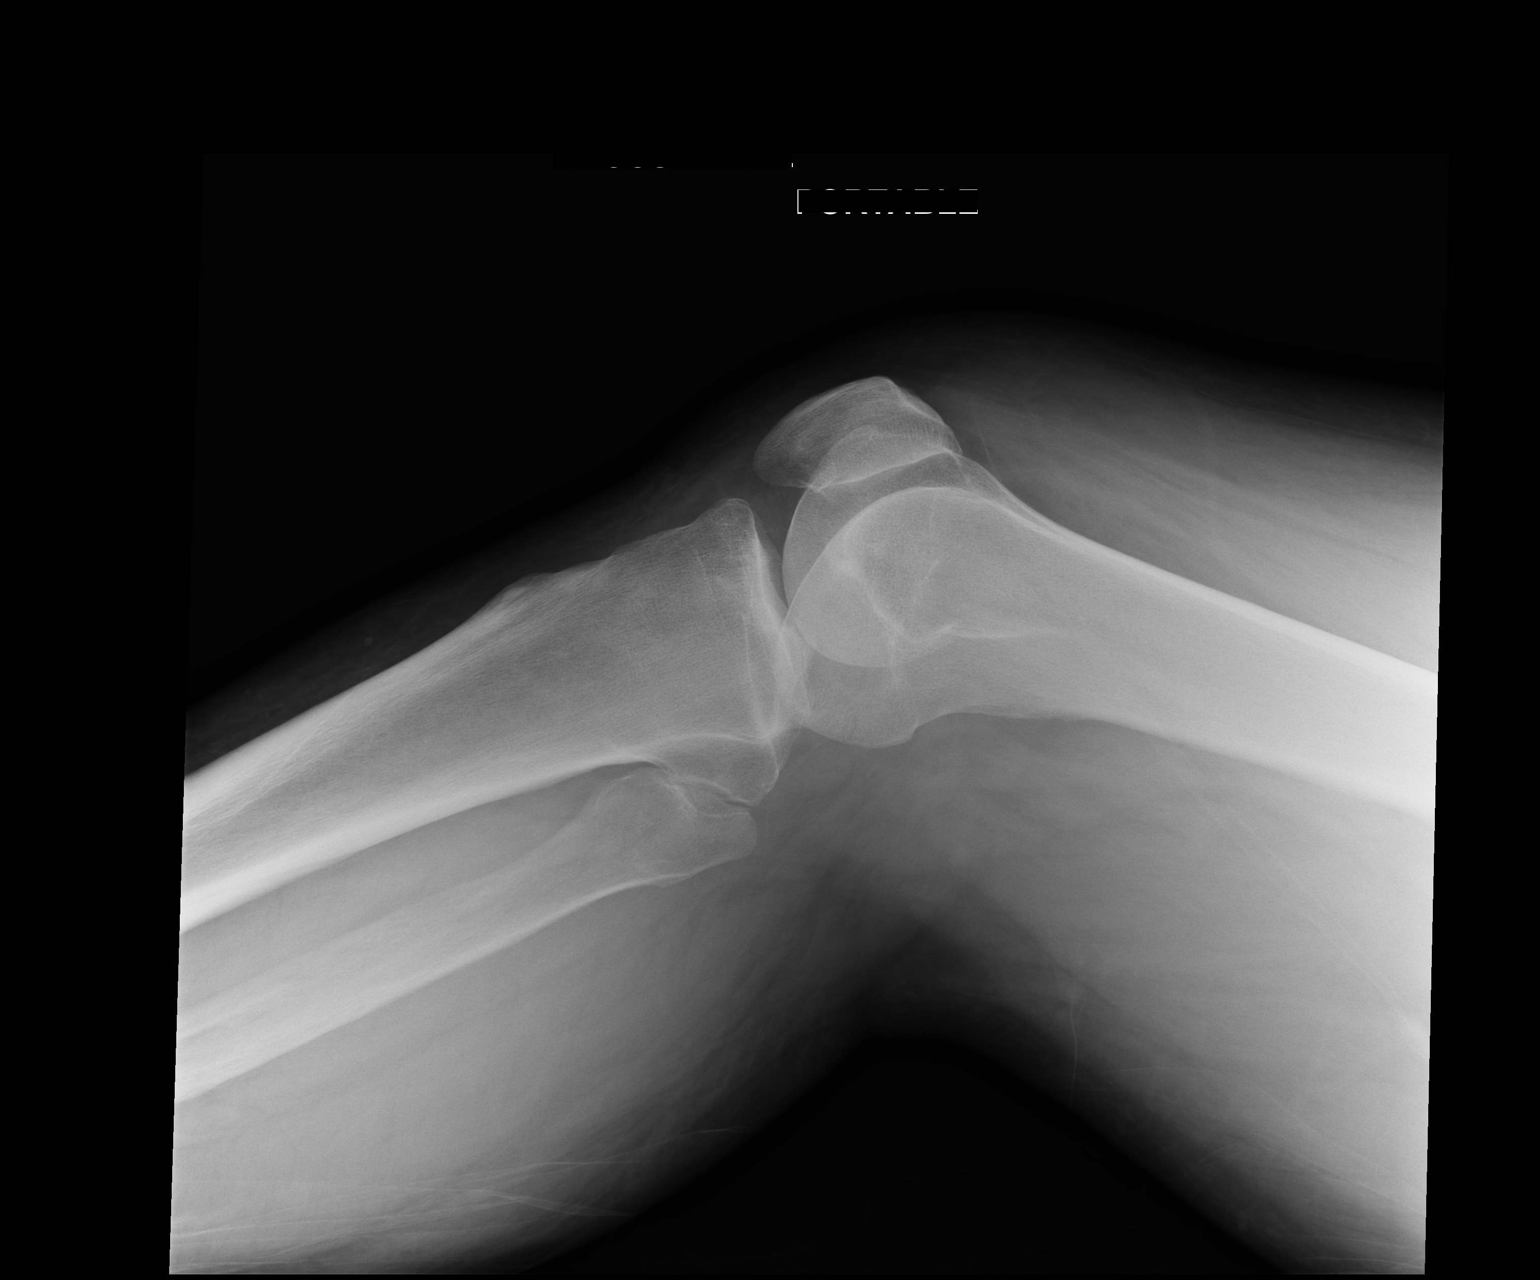

[2 of 2 positions shown; findings below may reference images not displayed]

FINDINGS: There is mild lucency within the left fibular head, which may
reflect a mildly displaced fracture.

No additional fractures are seen. The joint spaces are preserved.
Minimal marginal osteophyte formation is suggested at the medial
compartment; the patellofemoral joint is grossly unremarkable in
appearance.

No significant joint effusion is seen. The visualized soft tissues
are normal in appearance.
IMPRESSION: Suspect mildly displaced fracture involving the lateral aspect of
the left fibular head.

## 2015-11-21 IMAGING — CT CT ABD-PELV W/ CM
2 of 5 series · 17 of 46 positions shown, 19 images · IV contrast (APPLIED)
Comparison: 09/02/2013

CLINICAL DATA: Right-sided abdominal pain, nausea and vomiting,
symptoms for 2 days

EXAM:
CT ABDOMEN AND PELVIS WITH CONTRAST
TECHNIQUE: Multidetector CT imaging of the abdomen and pelvis was performed
using the standard protocol following bolus administration of
intravenous contrast.
CONTRAST:  100 cc Omnipaque 300 IV contrast

[Series 4: coronal soft tissue · coronal · 1.06mm/px · 3 of 118 slices shown]
[im 40/118  soft-tissue]
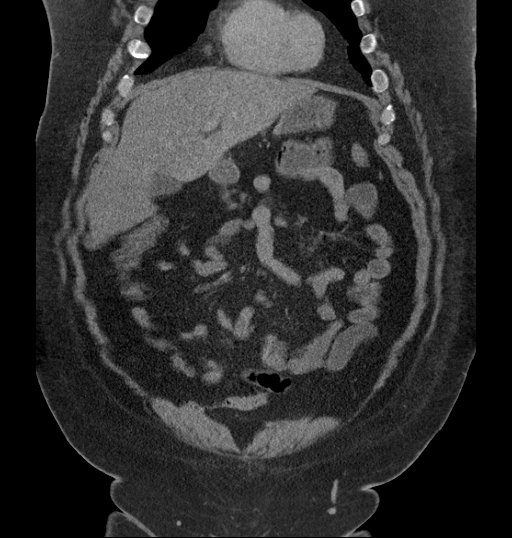
[im 53/118  soft-tissue]
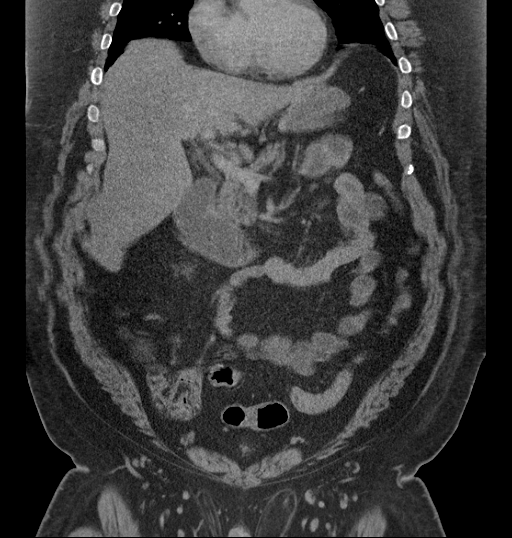
[im 66/118  soft-tissue]
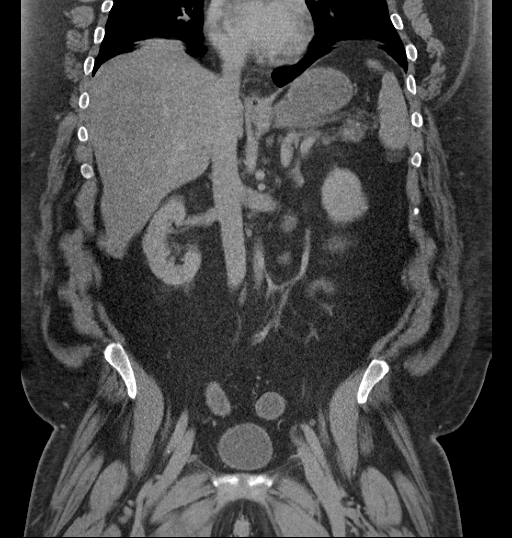

[Series 8: abd/ pelvis 5.0 i30f 1 · axial · 0.98mm/px · z∈[-940,-435]mm · 14 of 115 slices shown, 16 images]
[im 7/115  soft-tissue]
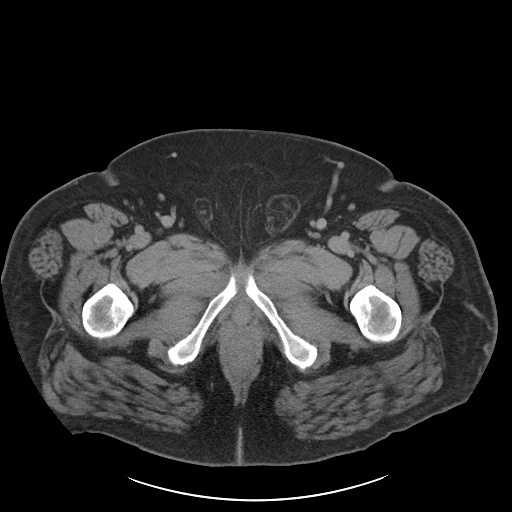
[im 7/115  bone]
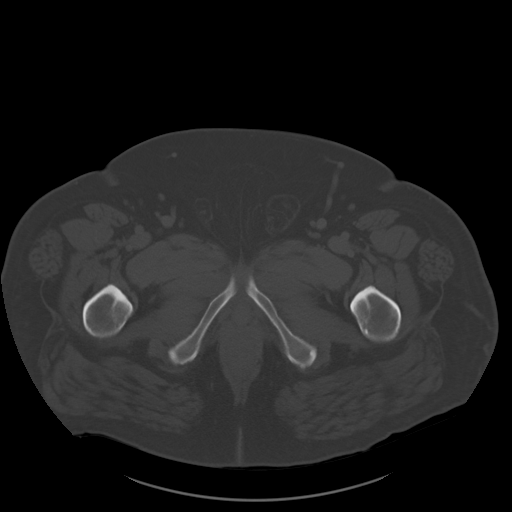
[im 13/115  soft-tissue]
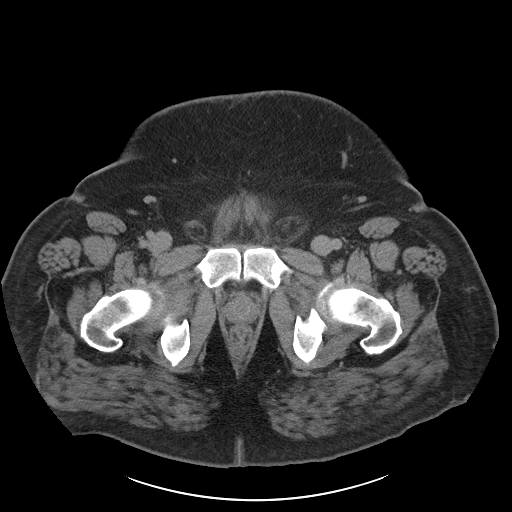
[im 26/115  soft-tissue]
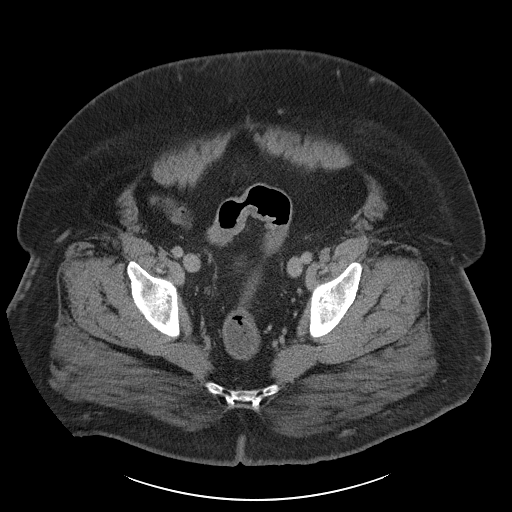
[im 32/115  soft-tissue]
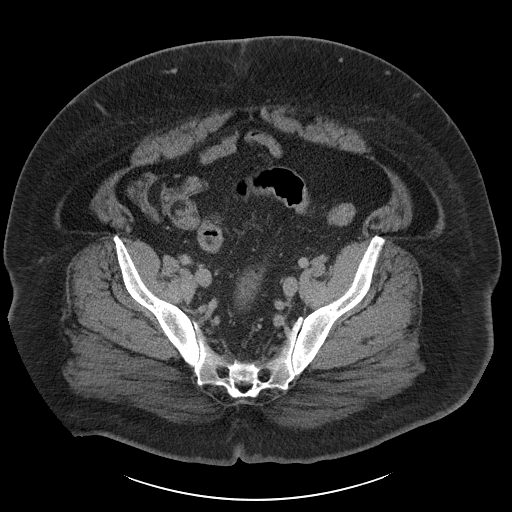
[im 39/115  soft-tissue]
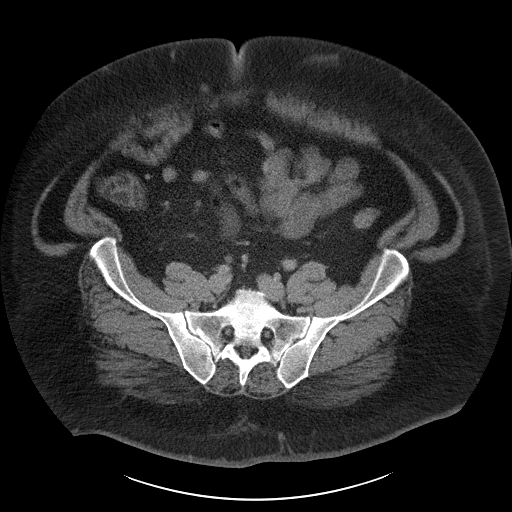
[im 45/115  soft-tissue]
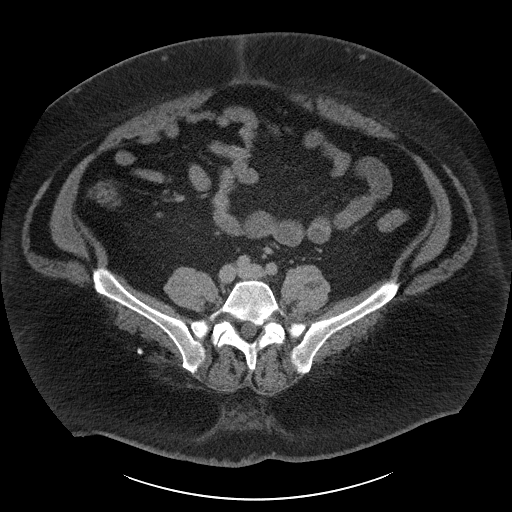
[im 51/115  soft-tissue]
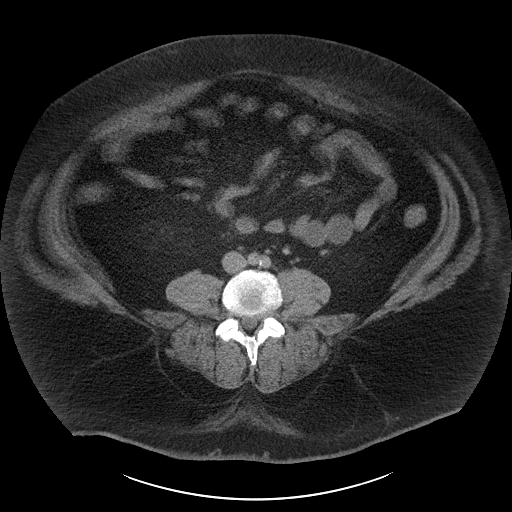
[im 64/115  soft-tissue]
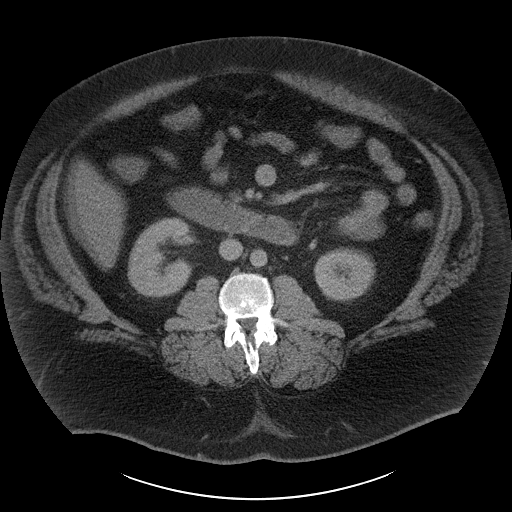
[im 70/115  soft-tissue]
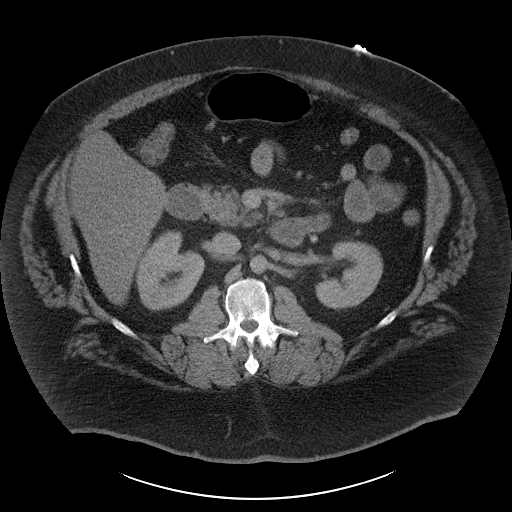
[im 70/115  bone]
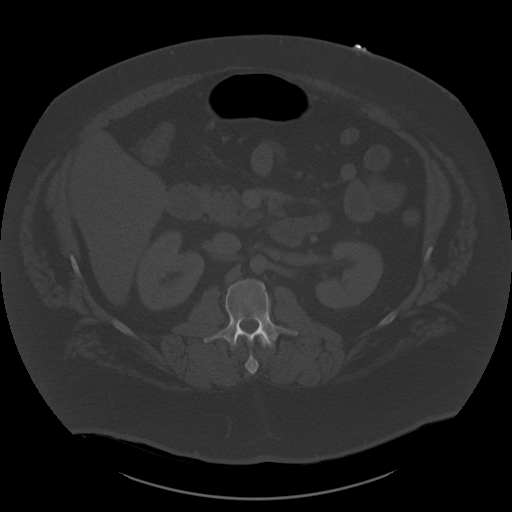
[im 77/115  soft-tissue]
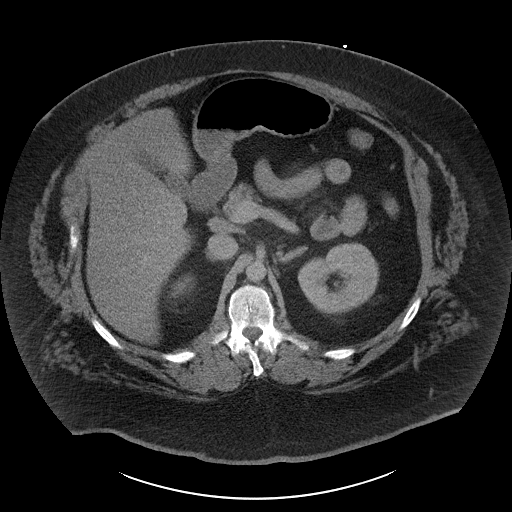
[im 83/115  soft-tissue]
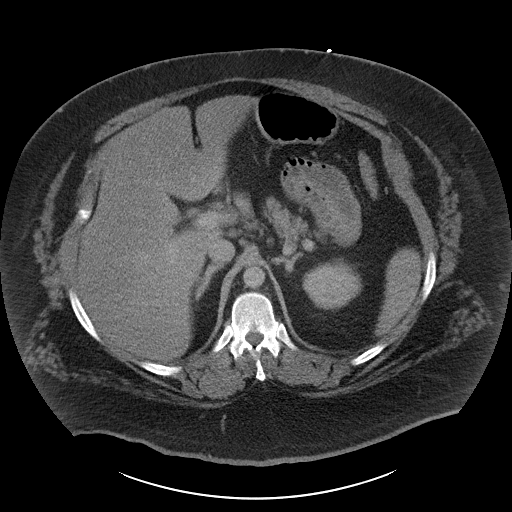
[im 89/115  soft-tissue]
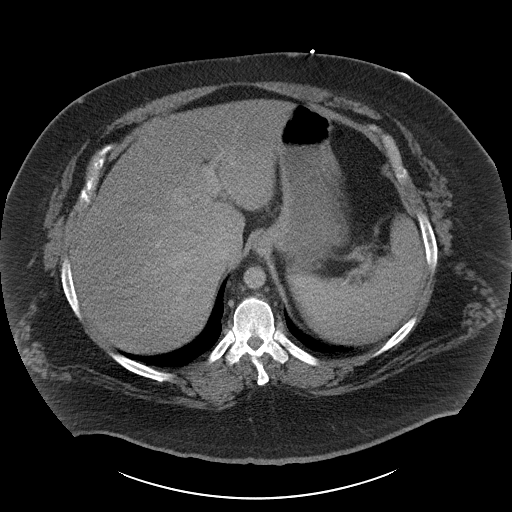
[im 102/115  soft-tissue]
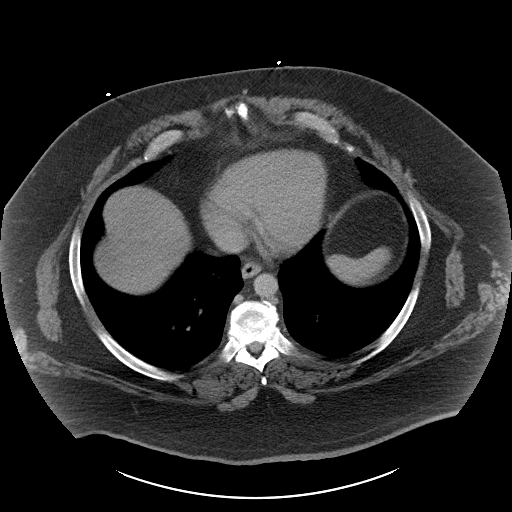
[im 108/115  soft-tissue]
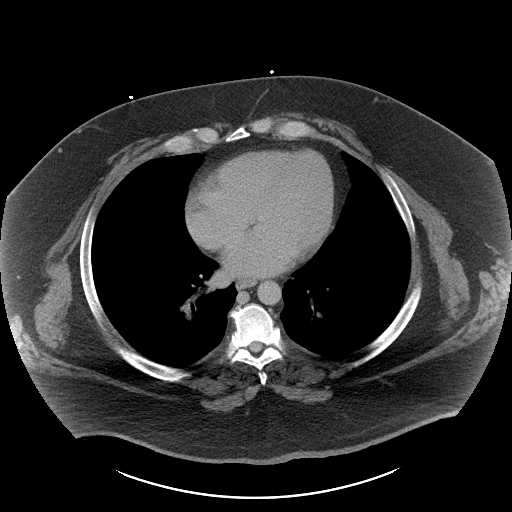

[17 of 46 positions shown; findings below may reference images not displayed]

FINDINGS: Lung bases are clear.

Mild motion artifact is present at multiple levels, degrading
imaging. Hepatic hypodensity suggests steatosis. No focal
abnormality. Adrenal glands, kidneys, spleen, and pancreas are
unremarkable allowing for motion artifact at multiple levels. Porta
hepatis nodes measuring up to 1.5 cm image 35 are stable. No ascites
or lymphadenopathy.

The appendix is not identified but there is no secondary evidence
for acute appendicitis. No bowel wall thickening or focal segmental
dilatation. Fluid within the second and third portions of the
otherwise normal-appearing duodenum is likely related to ingested
content. No evidence for small bowel obstruction. Small fat
containing left greater than right inguinal hernia. No acute osseous
abnormality.
IMPRESSION: No acute intra-abdominal or pelvic pathology.

Fluid distending the second and third portions of the otherwise
normal-appearing duodenum, likely reflecting ingested content.

Hepatic steatosis.

## 2015-11-21 IMAGING — CT CT HEAD W/O CM
1 series · 15 of 30 positions shown, 19 images · non-contrast
Comparison: CT of the head performed 09/02/2013, and MRI of the
brain from 09/03/2013

CLINICAL DATA: Acute onset of right-sided numbness for 2 days.
Initial encounter.

EXAM:
CT HEAD WITHOUT CONTRAST
TECHNIQUE: Contiguous axial images were obtained from the base of the skull
through the vertex without intravenous contrast.

[Series 2: head 5.0 h30s · axial · 0.46mm/px · z∈[-133,+17]mm · 15 of 34 slices shown, 19 images]
[im 2/34  brain]
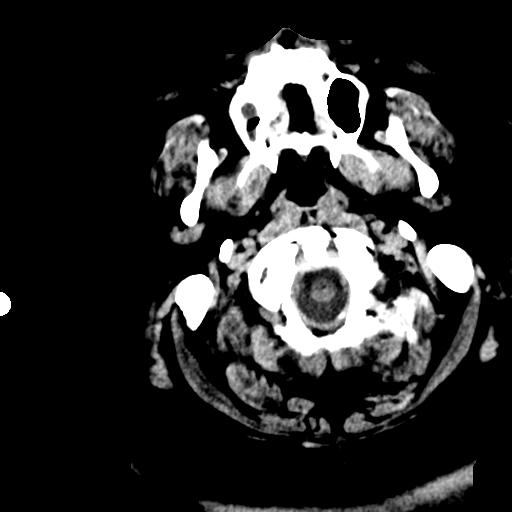
[im 2/34  bone]
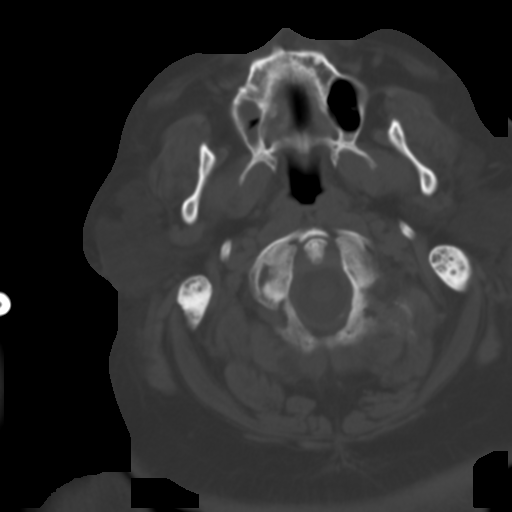
[im 4/34  brain]
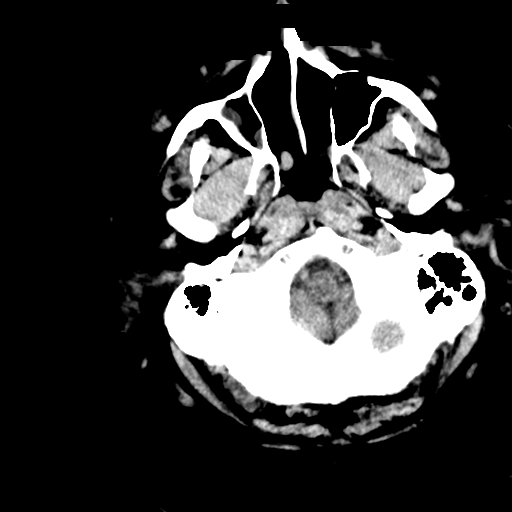
[im 6/34  brain]
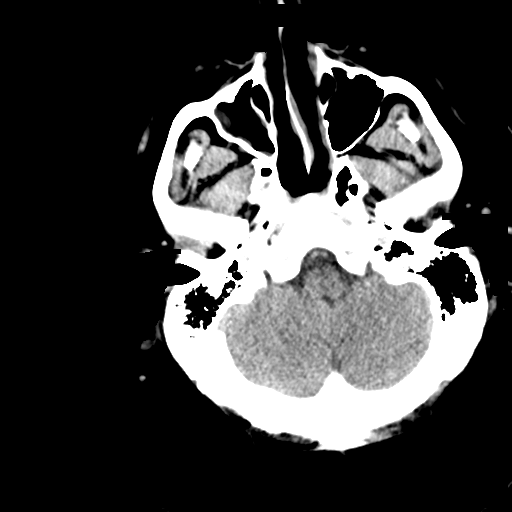
[im 8/34  brain]
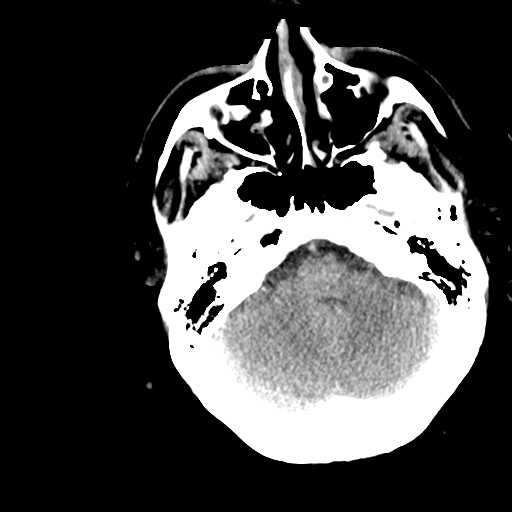
[im 11/34  brain]
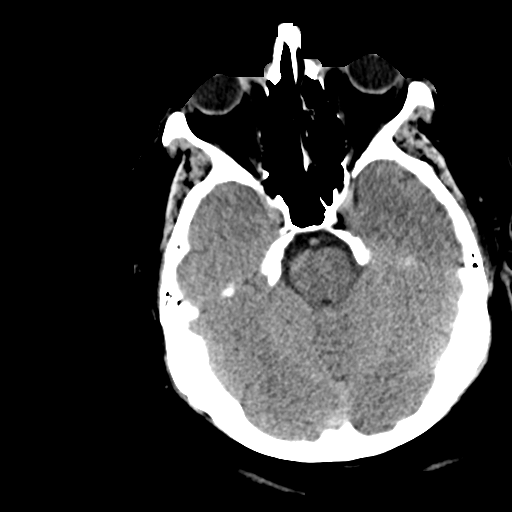
[im 11/34  bone]
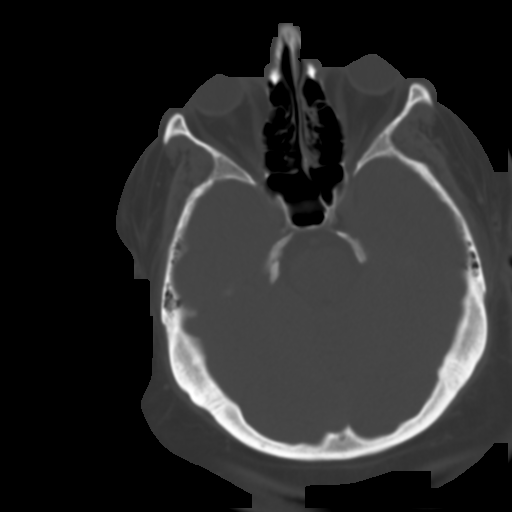
[im 13/34  brain]
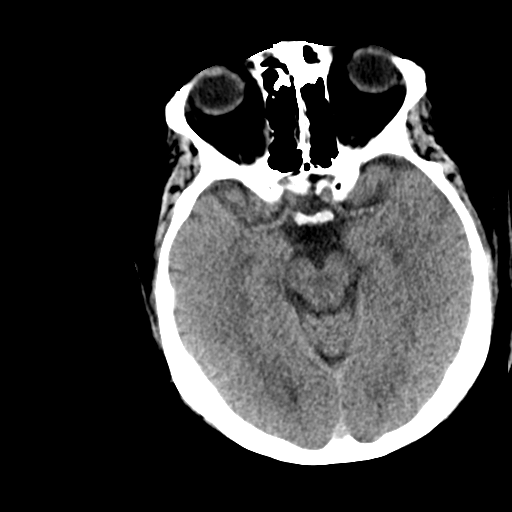
[im 15/34  brain]
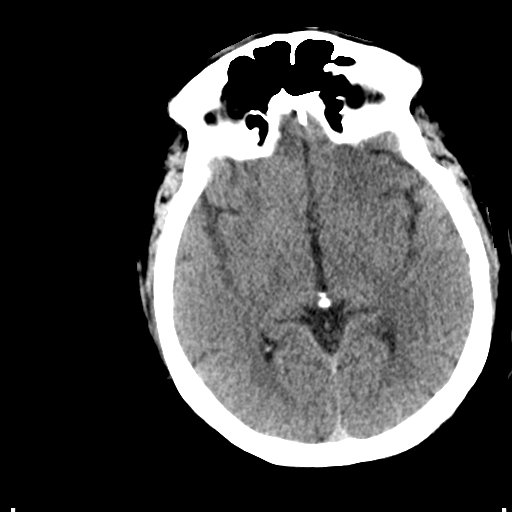
[im 18/34  brain]
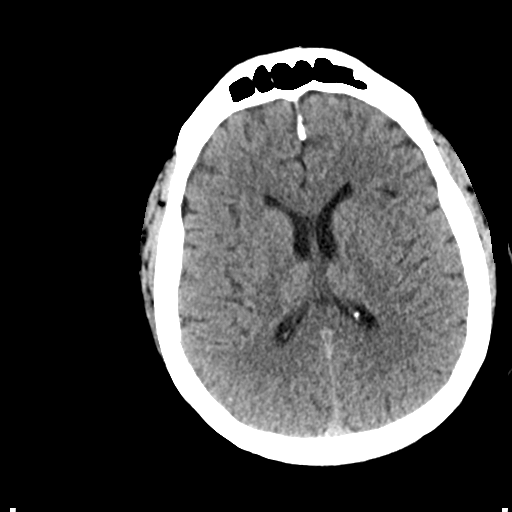
[im 19/34  brain]
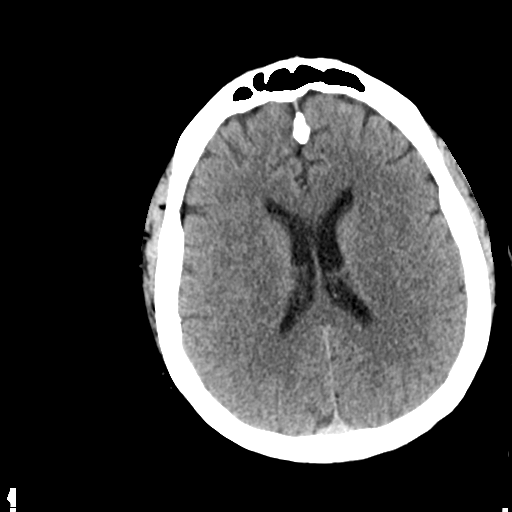
[im 19/34  bone]
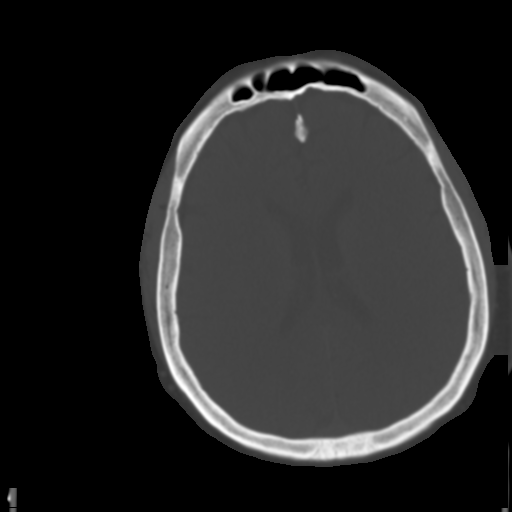
[im 21/34  brain]
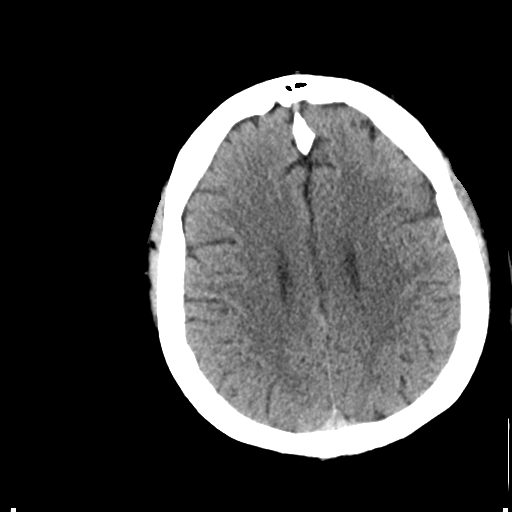
[im 23/34  brain]
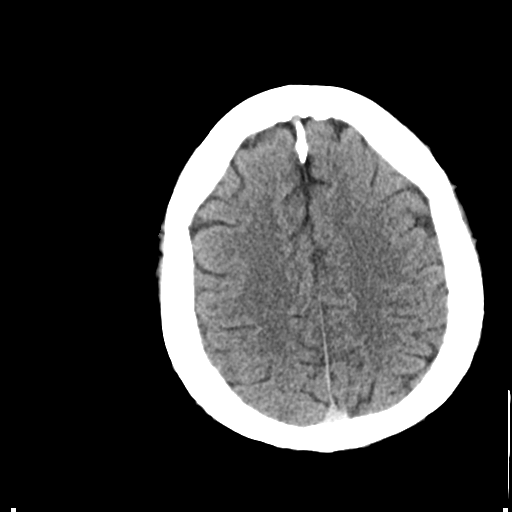
[im 26/34  brain]
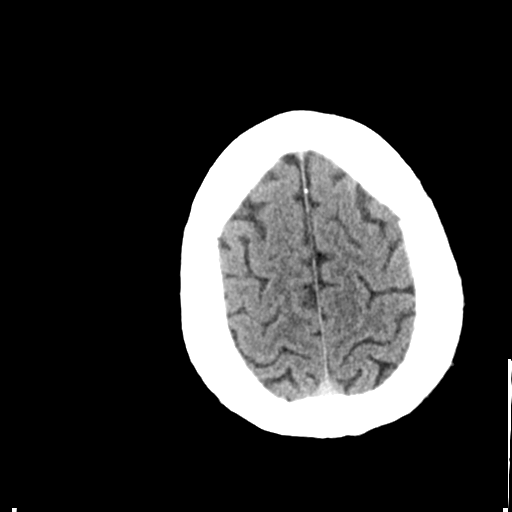
[im 28/34  brain]
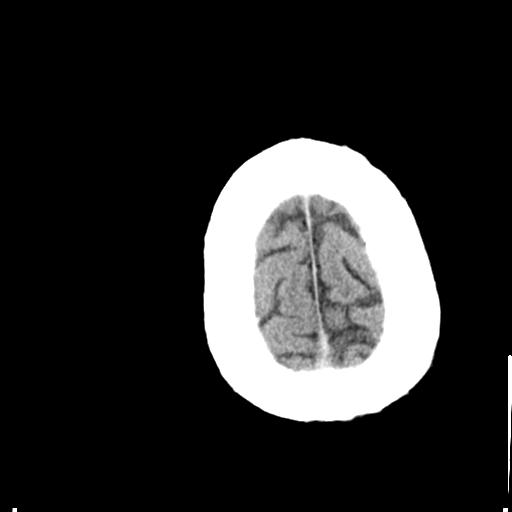
[im 28/34  bone]
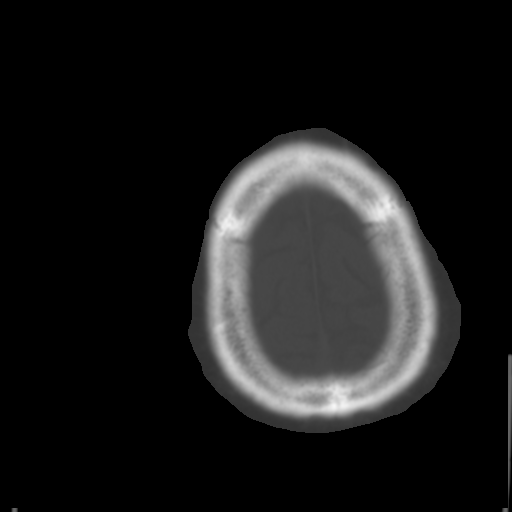
[im 30/34  brain]
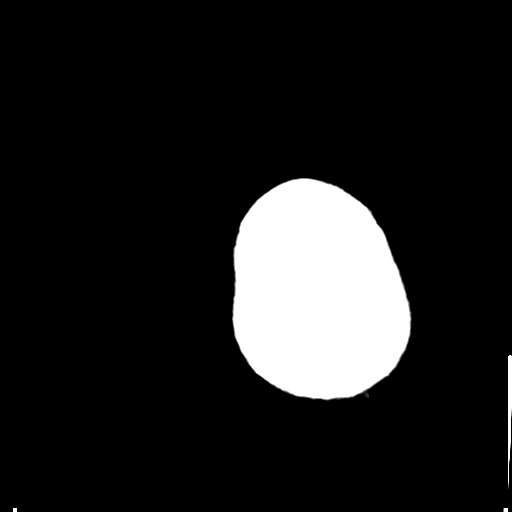
[im 32/34  brain]
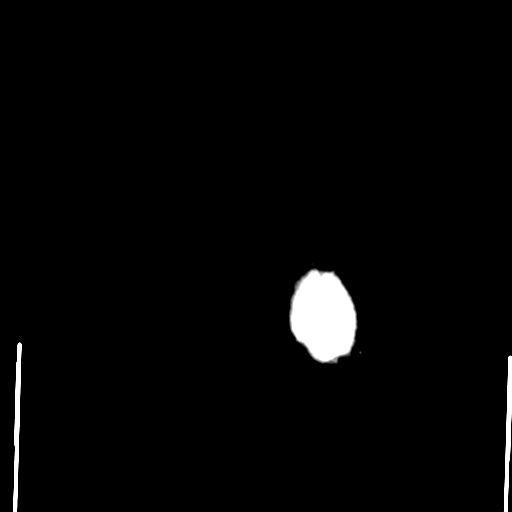

[15 of 30 positions shown; findings below may reference images not displayed]

FINDINGS: There is no evidence of acute infarction, mass lesion, or intra- or
extra-axial hemorrhage on CT.

The posterior fossa, including the cerebellum, brainstem and fourth
ventricle, is within normal limits. The third and lateral
ventricles, and basal ganglia are unremarkable in appearance. The
cerebral hemispheres are symmetric in appearance, with normal
gray-white differentiation. No mass effect or midline shift is seen.

There is no evidence of fracture; visualized osseous structures are
unremarkable in appearance. The orbits are within normal limits.
Mucosal thickening is noted at the right maxillary sinus, and there
is mild partial opacification of the right mastoid air cells. The
remaining paranasal sinuses and left mastoid air cells are
well-aerated. No significant soft tissue abnormalities are seen.
IMPRESSION: 1. No acute intracranial pathology seen on CT.
2. Mucosal thickening at the right maxillary sinus, and mild partial
opacification of the right mastoid air cells.

## 2015-11-21 IMAGING — DX DG RIBS W/ CHEST 3+V*R*
4 series · 4 of 4 positions shown · non-contrast
Comparison: CTA of the chest performed 09/02/2013, and chest
radiograph performed 05/29/2011

CLINICAL DATA: Acute onset of mid upper rib and chest pain, status
post fall. Shortness of breath. Initial encounter.

EXAM:
RIGHT RIBS AND CHEST - 3+ VIEW

[chest pa]
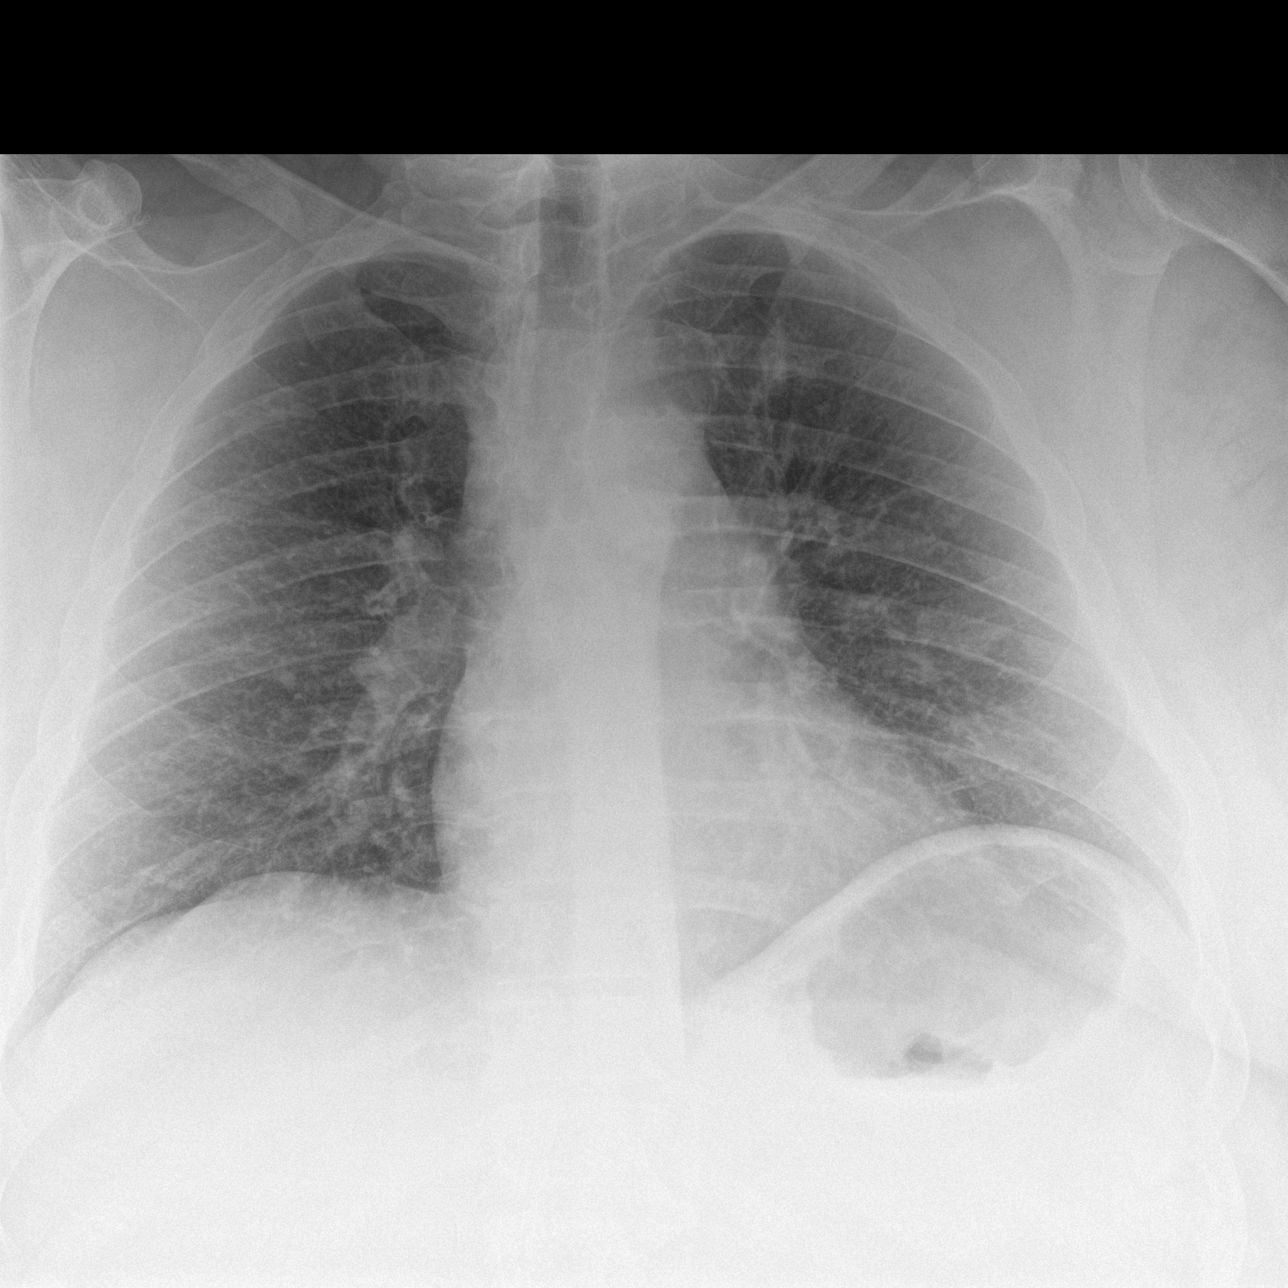

[rib pa (1 of 2)]
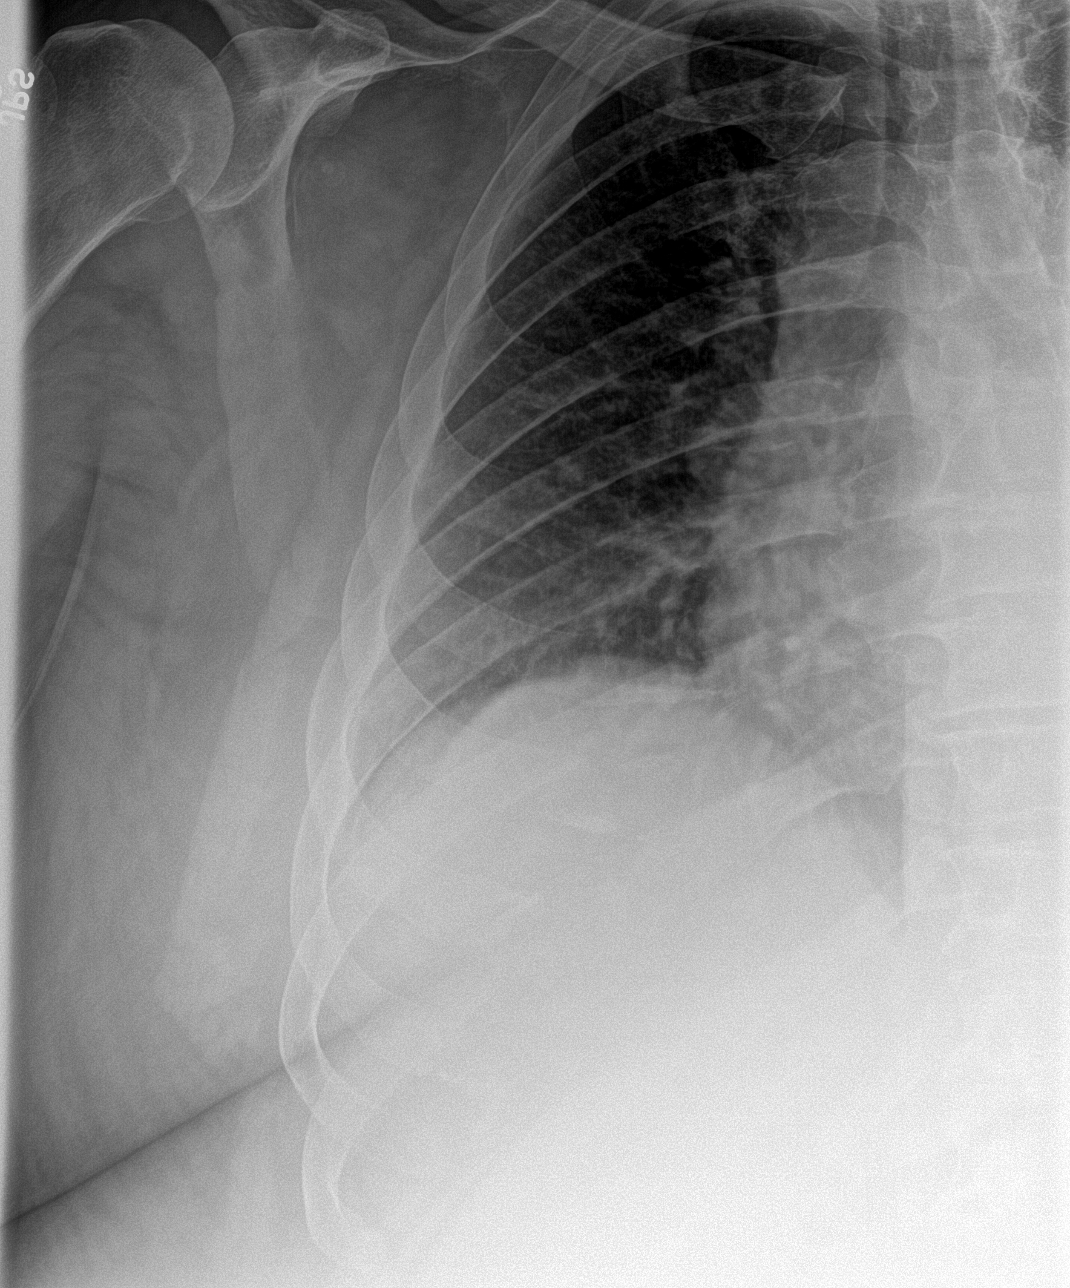

[rib pa (2 of 2)]
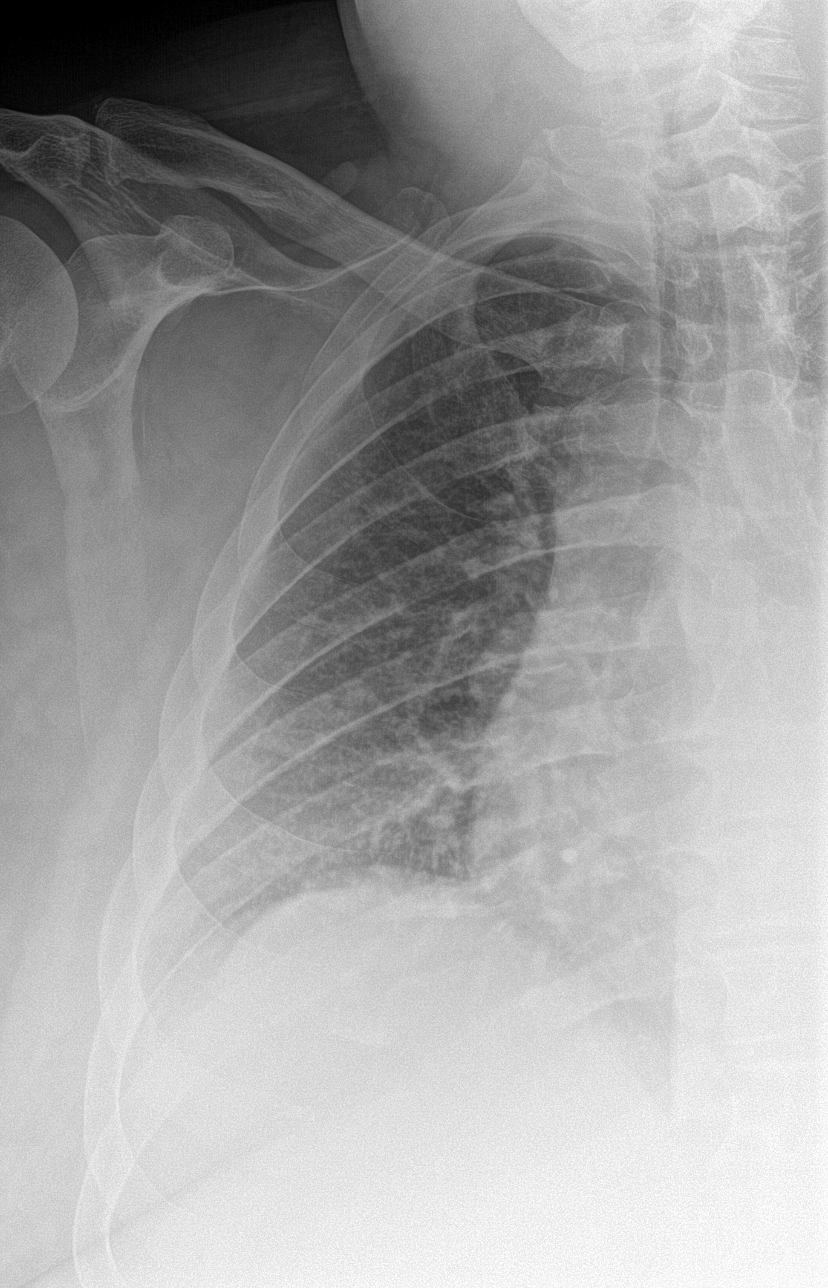

[rib pa obl]
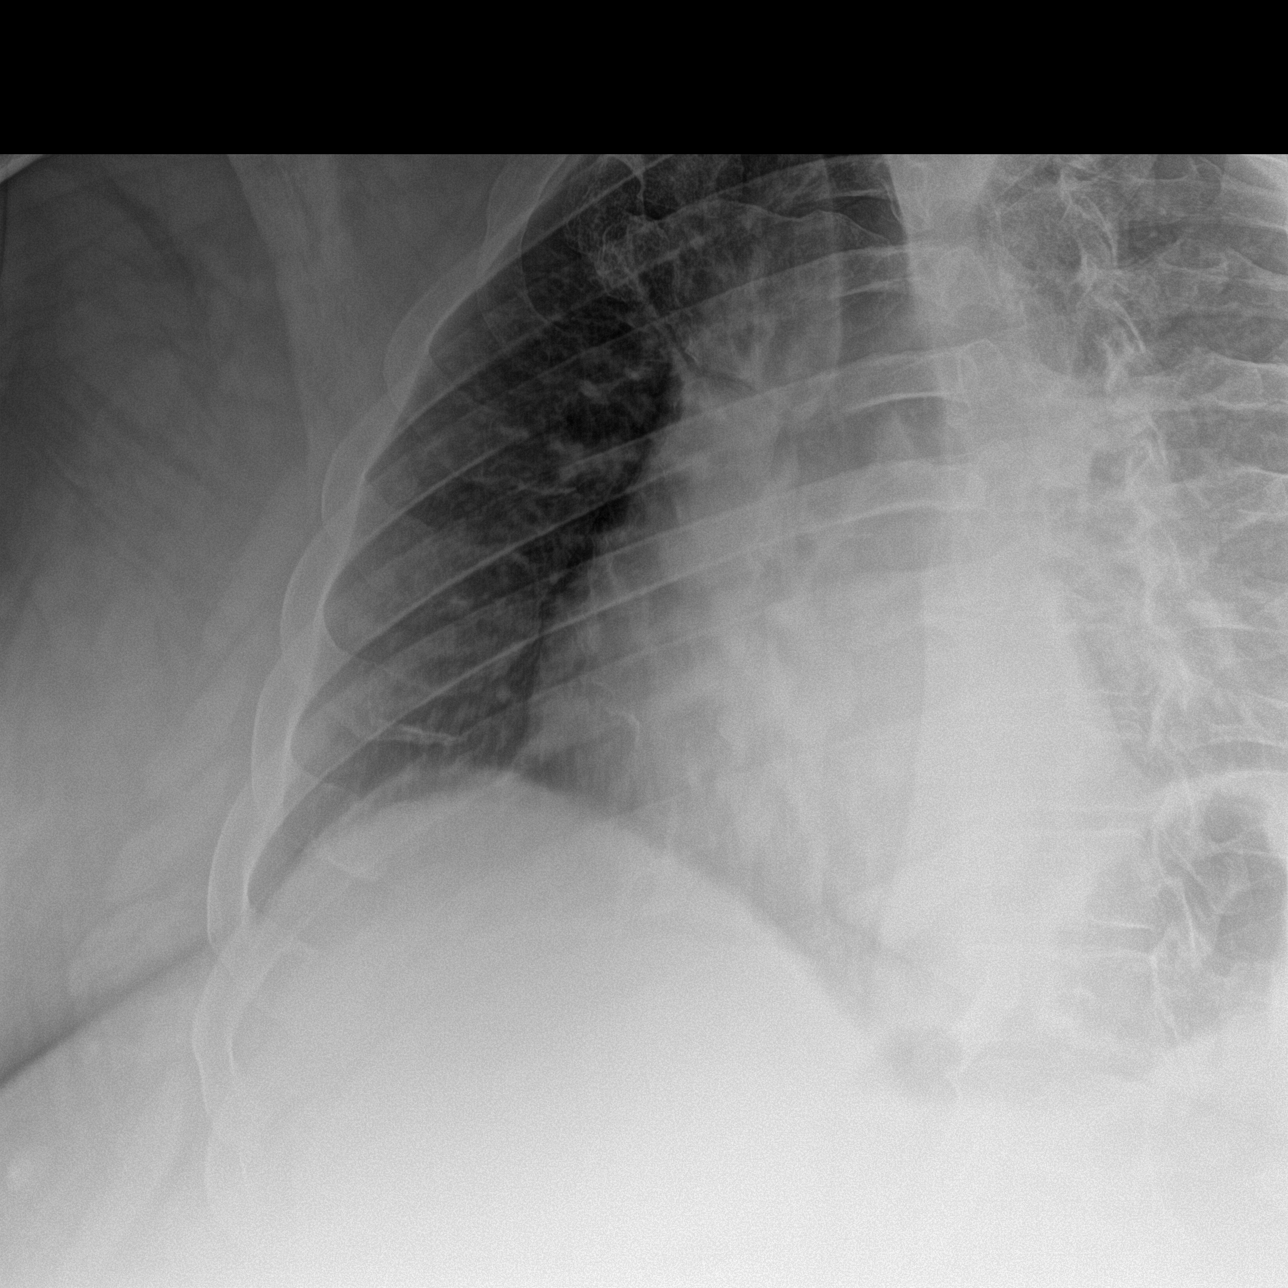

[4 of 4 positions shown; findings below may reference images not displayed]

FINDINGS: The lungs are well-aerated. Vascular congestion is noted, with
chronically increased interstitial markings. There is no evidence of
focal opacification, pleural effusion or pneumothorax.

The cardiomediastinal silhouette is within normal limits. No acute
osseous abnormalities are seen.
IMPRESSION: Vascular congestion, with chronically interstitial markings. No
displaced rib fracture seen.

## 2015-11-21 IMAGING — CR DG KNEE 1-2V PORT*R*
2 series · 2 of 2 positions shown · non-contrast
Comparison: Right knee radiographs performed 02/18/2012

CLINICAL DATA: Status post fall onto both knees while trying to get
into wheelchair. Bilateral knee pain. Initial encounter.

EXAM:
PORTABLE RIGHT KNEE - 1-2 VIEW

[AP]
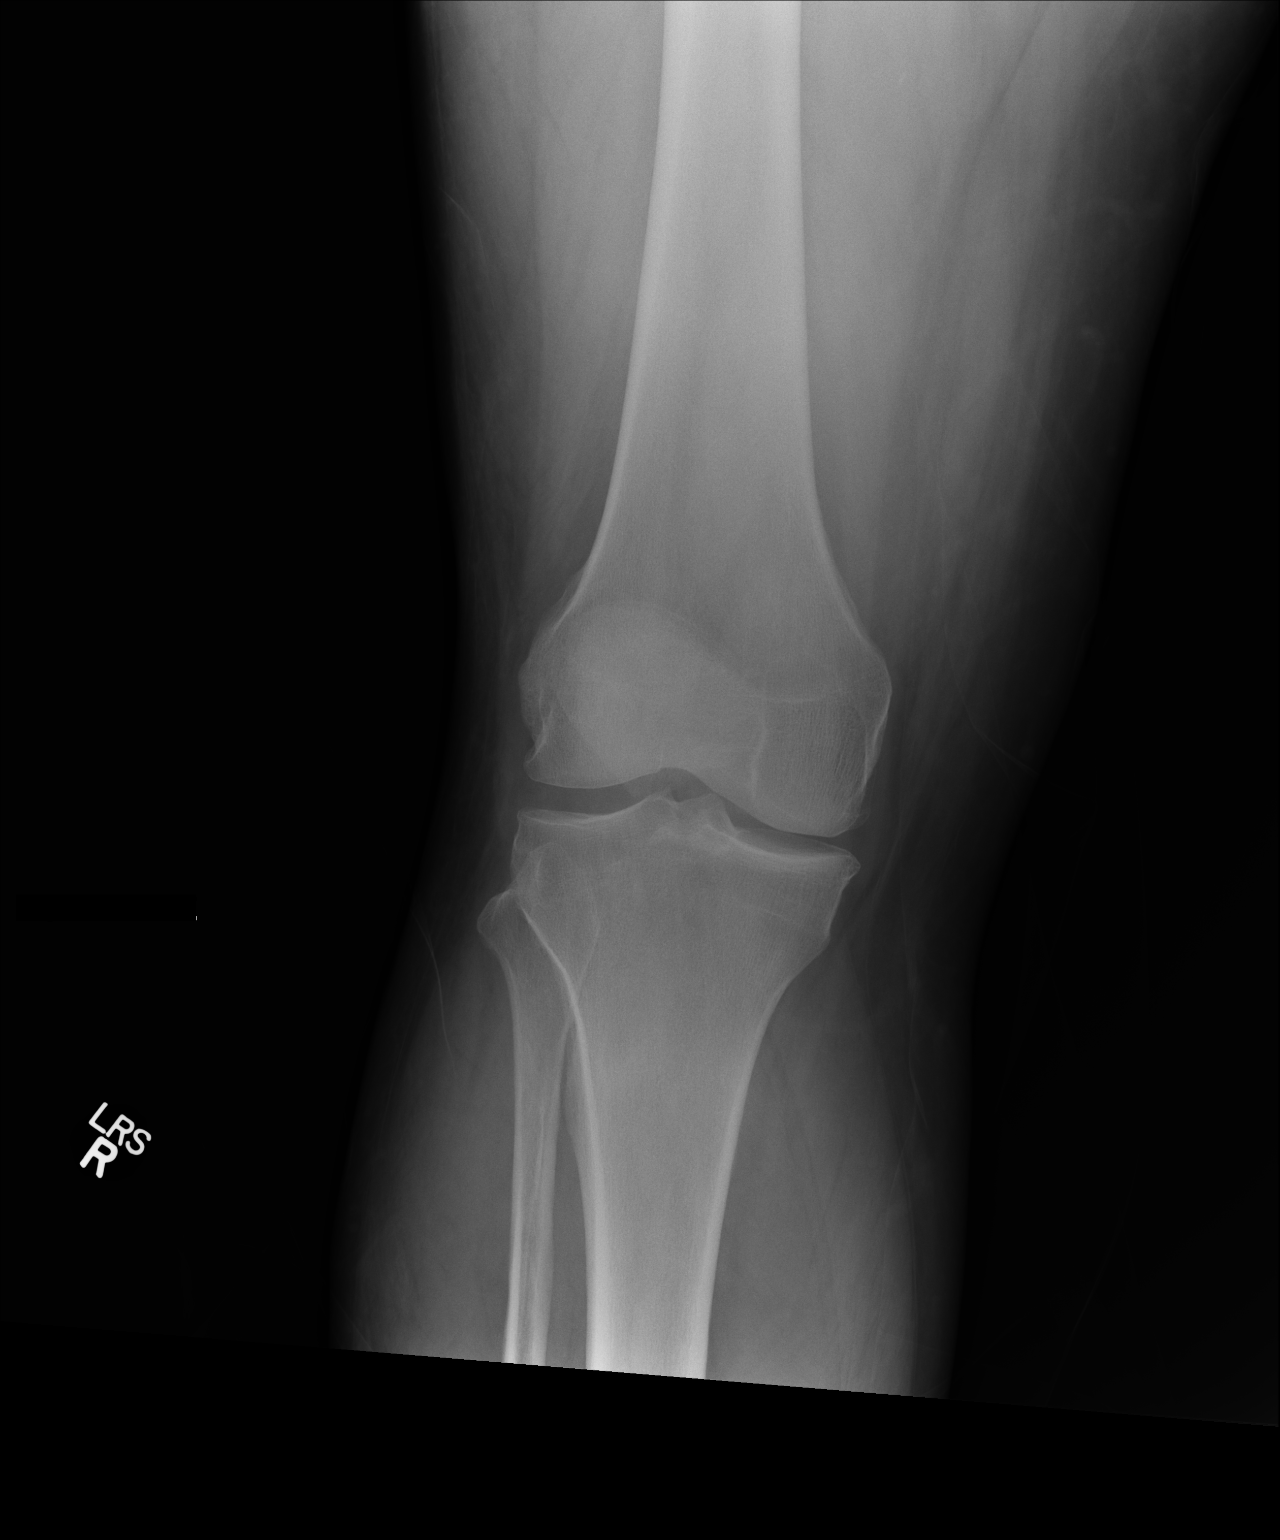

[xtable lateral]
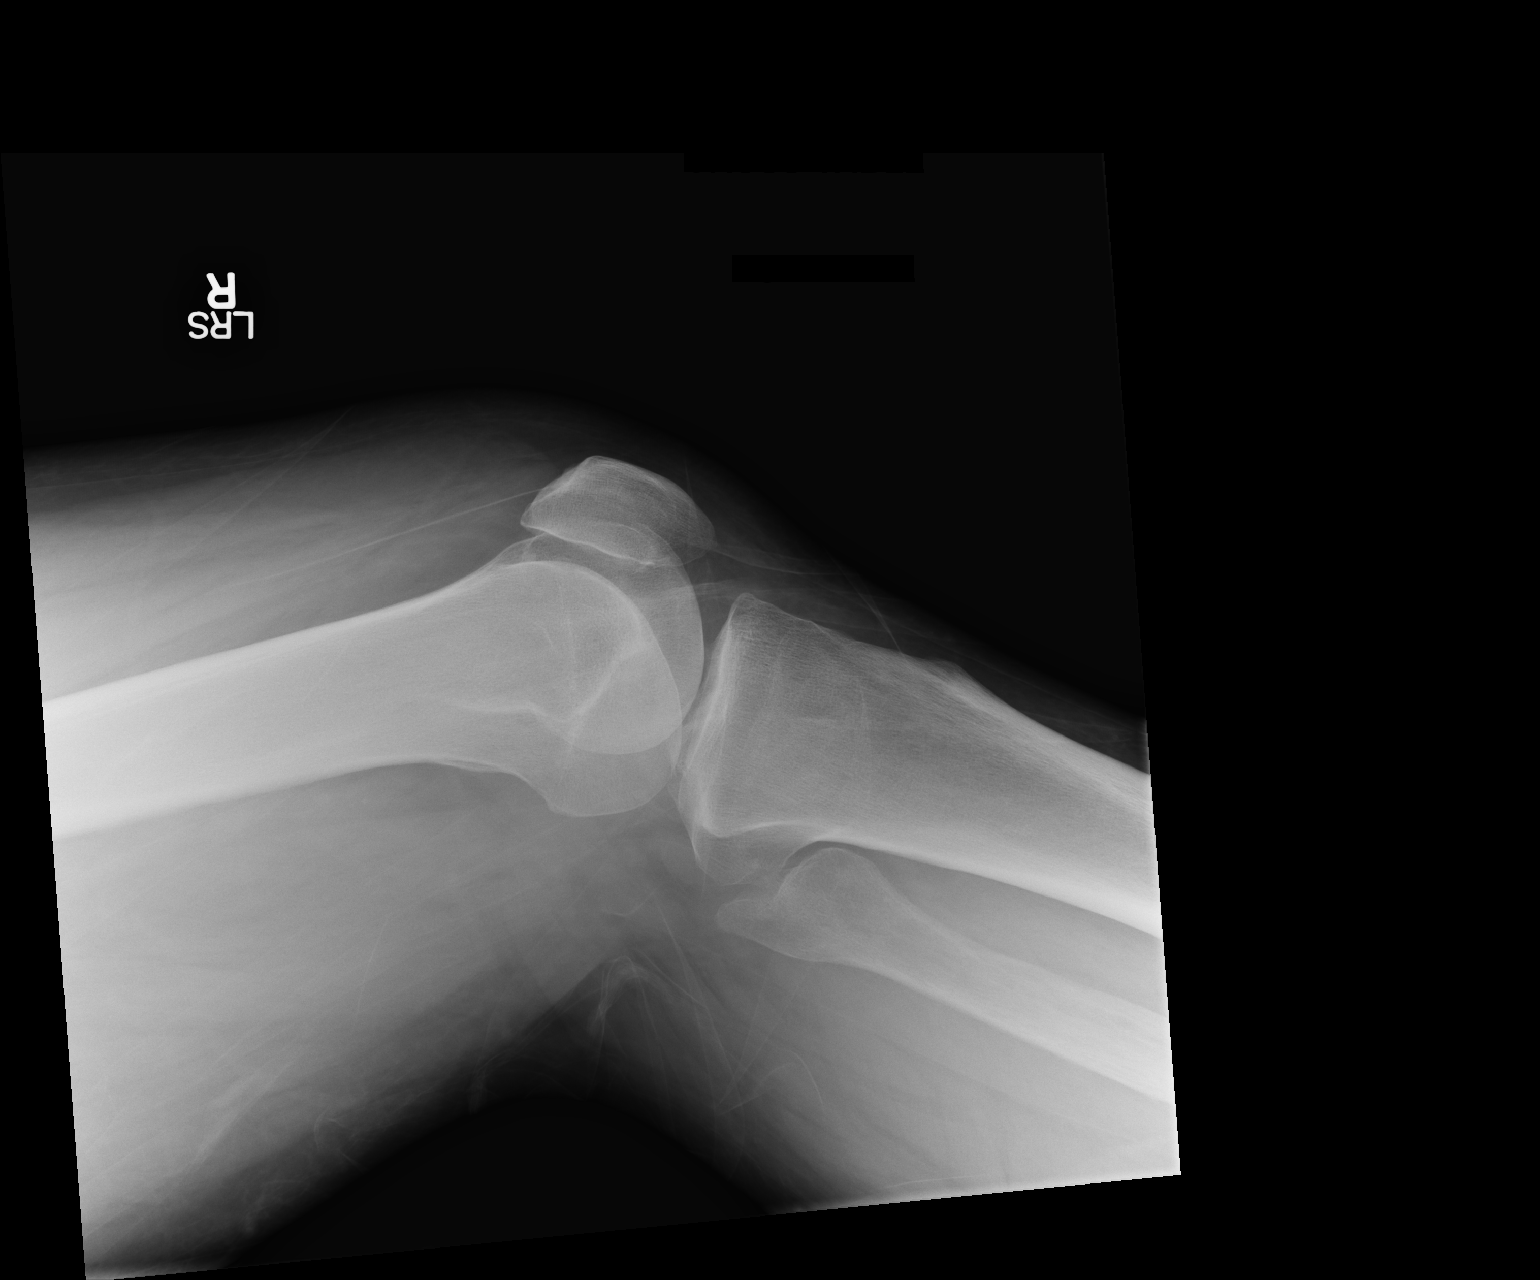

[2 of 2 positions shown; findings below may reference images not displayed]

FINDINGS: There is no evidence of fracture or dislocation. The joint spaces
are preserved. No significant degenerative change is seen; the
patellofemoral joint is grossly unremarkable in appearance.

No significant joint effusion is seen. The visualized soft tissues
are normal in appearance.
IMPRESSION: No evidence of fracture or dislocation.

## 2015-11-21 MED FILL — ACETAMINOPHEN/COD #3 TABLET: 300-30 | 22 days supply | Qty: 90 | Fill #0

## 2015-11-24 ENCOUNTER — Other Ambulatory Visit: Payer: Self-pay | Admitting: Internal Medicine

## 2015-12-01 ENCOUNTER — Inpatient Hospital Stay: Payer: Self-pay | Admitting: Pulmonary Disease

## 2015-12-17 ENCOUNTER — Encounter: Payer: Self-pay | Admitting: Critical Care Medicine

## 2016-01-01 ENCOUNTER — Emergency Department (HOSPITAL_COMMUNITY): Payer: Medicare Other

## 2016-01-01 ENCOUNTER — Inpatient Hospital Stay (HOSPITAL_COMMUNITY)
Admission: EM | Admit: 2016-01-01 | Discharge: 2016-01-02 | DRG: 917 | Discharge status: Against Medical Advice | Payer: Medicare Other | Attending: Internal Medicine | Admitting: Internal Medicine

## 2016-01-01 ENCOUNTER — Encounter (HOSPITAL_COMMUNITY): Payer: Self-pay | Admitting: Family Medicine

## 2016-01-01 DIAGNOSIS — Z6841 Body Mass Index (BMI) 40.0 and over, adult: Secondary | ICD-10-CM

## 2016-01-01 DIAGNOSIS — Z79899 Other long term (current) drug therapy: Secondary | ICD-10-CM

## 2016-01-01 DIAGNOSIS — G9340 Encephalopathy, unspecified: Secondary | ICD-10-CM | POA: Diagnosis present

## 2016-01-01 DIAGNOSIS — Z7982 Long term (current) use of aspirin: Secondary | ICD-10-CM

## 2016-01-01 DIAGNOSIS — G4733 Obstructive sleep apnea (adult) (pediatric): Secondary | ICD-10-CM

## 2016-01-01 DIAGNOSIS — Z888 Allergy status to other drugs, medicaments and biological substances status: Secondary | ICD-10-CM

## 2016-01-01 DIAGNOSIS — E119 Type 2 diabetes mellitus without complications: Secondary | ICD-10-CM | POA: Diagnosis not present

## 2016-01-01 DIAGNOSIS — Z8249 Family history of ischemic heart disease and other diseases of the circulatory system: Secondary | ICD-10-CM

## 2016-01-01 DIAGNOSIS — T424X1A Poisoning by benzodiazepines, accidental (unintentional), initial encounter: Principal | ICD-10-CM | POA: Diagnosis present

## 2016-01-01 DIAGNOSIS — T50904A Poisoning by unspecified drugs, medicaments and biological substances, undetermined, initial encounter: Secondary | ICD-10-CM | POA: Diagnosis not present

## 2016-01-01 DIAGNOSIS — F1920 Other psychoactive substance dependence, uncomplicated: Secondary | ICD-10-CM

## 2016-01-01 DIAGNOSIS — Y92009 Unspecified place in unspecified non-institutional (private) residence as the place of occurrence of the external cause: Secondary | ICD-10-CM

## 2016-01-01 DIAGNOSIS — F419 Anxiety disorder, unspecified: Secondary | ICD-10-CM | POA: Diagnosis present

## 2016-01-01 DIAGNOSIS — Z833 Family history of diabetes mellitus: Secondary | ICD-10-CM

## 2016-01-01 DIAGNOSIS — E785 Hyperlipidemia, unspecified: Secondary | ICD-10-CM | POA: Diagnosis present

## 2016-01-01 DIAGNOSIS — F909 Attention-deficit hyperactivity disorder, unspecified type: Secondary | ICD-10-CM | POA: Diagnosis present

## 2016-01-01 DIAGNOSIS — Z794 Long term (current) use of insulin: Secondary | ICD-10-CM

## 2016-01-01 DIAGNOSIS — F1120 Opioid dependence, uncomplicated: Secondary | ICD-10-CM | POA: Diagnosis present

## 2016-01-01 DIAGNOSIS — K2100 Gastro-esophageal reflux disease with esophagitis, without bleeding: Secondary | ICD-10-CM | POA: Diagnosis present

## 2016-01-01 DIAGNOSIS — F319 Bipolar disorder, unspecified: Secondary | ICD-10-CM | POA: Diagnosis present

## 2016-01-01 DIAGNOSIS — I10 Essential (primary) hypertension: Secondary | ICD-10-CM | POA: Diagnosis not present

## 2016-01-01 DIAGNOSIS — G92 Toxic encephalopathy: Secondary | ICD-10-CM | POA: Diagnosis present

## 2016-01-01 DIAGNOSIS — J45909 Unspecified asthma, uncomplicated: Secondary | ICD-10-CM | POA: Diagnosis present

## 2016-01-01 DIAGNOSIS — R0602 Shortness of breath: Secondary | ICD-10-CM

## 2016-01-01 DIAGNOSIS — E871 Hypo-osmolality and hyponatremia: Secondary | ICD-10-CM | POA: Diagnosis present

## 2016-01-01 DIAGNOSIS — K219 Gastro-esophageal reflux disease without esophagitis: Secondary | ICD-10-CM | POA: Diagnosis present

## 2016-01-01 DIAGNOSIS — Z9989 Dependence on other enabling machines and devices: Secondary | ICD-10-CM

## 2016-01-01 LAB — COMPREHENSIVE METABOLIC PANEL
ALK PHOS: 89 U/L (ref 38–126)
ALT: 42 U/L (ref 17–63)
ANION GAP: 7 (ref 5–15)
AST: 46 U/L — ABNORMAL HIGH (ref 15–41)
Albumin: 3.8 g/dL (ref 3.5–5.0)
BILIRUBIN TOTAL: 0.4 mg/dL (ref 0.3–1.2)
BUN: 11 mg/dL (ref 6–20)
CALCIUM: 8.4 mg/dL — AB (ref 8.9–10.3)
CO2: 26 mmol/L (ref 22–32)
Chloride: 99 mmol/L — ABNORMAL LOW (ref 101–111)
Creatinine, Ser: 0.88 mg/dL (ref 0.61–1.24)
GFR calc non Af Amer: >60 mL/min (ref >60)
Glucose, Bld: 241 mg/dL — ABNORMAL HIGH (ref 65–99)
Potassium: 3.9 mmol/L (ref 3.5–5.1)
SODIUM: 132 mmol/L — AB (ref 135–145)
TOTAL PROTEIN: 7.6 g/dL (ref 6.5–8.1)

## 2016-01-01 LAB — BLOOD GAS, ARTERIAL
ACID-BASE EXCESS: 1.8 mmol/L (ref 0.0–2.0)
Allens test (pass/fail): POSITIVE — AB
BICARBONATE: 24.8 mmol/L (ref 20.0–28.0)
Drawn by: 382351
FIO2: 21
O2 SAT: 90.7 %
PH ART: 7.317 — AB (ref 7.350–7.450)
Patient temperature: 37
pCO2 arterial: 55 mmHg — ABNORMAL HIGH (ref 32.0–48.0)
pO2, Arterial: 62.8 mmHg — ABNORMAL LOW (ref 83.0–108.0)

## 2016-01-01 LAB — URINALYSIS, ROUTINE W REFLEX MICROSCOPIC
Bilirubin Urine: NEGATIVE
GLUCOSE, UA: NEGATIVE mg/dL
Hgb urine dipstick: NEGATIVE
Ketones, ur: NEGATIVE mg/dL
LEUKOCYTES UA: NEGATIVE
Nitrite: NEGATIVE
PH: 5.5 (ref 5.0–8.0)
PROTEIN: 100 mg/dL — AB
Specific Gravity, Urine: >1.03 — ABNORMAL HIGH (ref 1.005–1.030)

## 2016-01-01 LAB — URINE MICROSCOPIC-ADD ON: RBC / HPF: NONE SEEN RBC/hpf (ref 0–5)

## 2016-01-01 LAB — RAPID URINE DRUG SCREEN, HOSP PERFORMED
Amphetamines: POSITIVE — AB
Barbiturates: NOT DETECTED
Benzodiazepines: POSITIVE — AB
COCAINE: NOT DETECTED
OPIATES: POSITIVE — AB
Tetrahydrocannabinol: NOT DETECTED

## 2016-01-01 LAB — I-STAT CHEM 8, ED
BUN: 13 mg/dL (ref 6–20)
CALCIUM ION: 1.05 mmol/L — AB (ref 1.15–1.40)
CHLORIDE: 97 mmol/L — AB (ref 101–111)
Creatinine, Ser: 0.9 mg/dL (ref 0.61–1.24)
GLUCOSE: 239 mg/dL — AB (ref 65–99)
HCT: 43 % (ref 39.0–52.0)
Hemoglobin: 14.6 g/dL (ref 13.0–17.0)
Potassium: 4.3 mmol/L (ref 3.5–5.1)
Sodium: 139 mmol/L (ref 135–145)
TCO2: 33 mmol/L (ref 0–100)

## 2016-01-01 LAB — CBG MONITORING, ED: Glucose-Capillary: 223 mg/dL — ABNORMAL HIGH (ref 65–99)

## 2016-01-01 LAB — ETHANOL

## 2016-01-01 MED ORDER — NALOXONE HCL 2 MG/2ML IJ SOSY
PREFILLED_SYRINGE | INTRAMUSCULAR | Status: AC
  Filled 2016-01-01: qty 2

## 2016-01-01 MED ORDER — SODIUM CHLORIDE 0.9 % IV SOLN: INTRAVENOUS | Status: DC

## 2016-01-01 MED ORDER — FAMOTIDINE IN NACL 20-0.9 MG/50ML-% IV SOLN
20.0000 mg | Freq: Two times a day (BID) | INTRAVENOUS | Status: DC
  Administered 2016-01-02: 20 mg via INTRAVENOUS
  Filled 2016-01-01: qty 50

## 2016-01-01 MED ORDER — ENOXAPARIN SODIUM 40 MG/0.4ML ~~LOC~~ SOLN: 40.0000 mg | SUBCUTANEOUS | Status: DC

## 2016-01-01 MED ORDER — MOMETASONE FURO-FORMOTEROL FUM 200-5 MCG/ACT IN AERO
2.0000 | INHALATION_SPRAY | Freq: Two times a day (BID) | RESPIRATORY_TRACT | Status: DC
  Administered 2016-01-02: 2 via RESPIRATORY_TRACT
  Filled 2016-01-01: qty 8.8

## 2016-01-01 MED ORDER — SODIUM CHLORIDE 0.9 % IV BOLUS (SEPSIS)
1000.0000 mL | Freq: Once | INTRAVENOUS | Status: AC
  Administered 2016-01-01: 1000 mL via INTRAVENOUS

## 2016-01-01 MED ORDER — NALOXONE HCL 0.4 MG/ML IJ SOLN: 0.4000 mg | INTRAMUSCULAR | Status: DC | PRN

## 2016-01-01 MED ORDER — INSULIN ASPART 100 UNIT/ML ~~LOC~~ SOLN
0.0000 [IU] | SUBCUTANEOUS | Status: DC
  Administered 2016-01-02: 5 [IU] via SUBCUTANEOUS
  Administered 2016-01-02: 3 [IU] via SUBCUTANEOUS
  Administered 2016-01-02: 8 [IU] via SUBCUTANEOUS

## 2016-01-01 MED ORDER — CLONIDINE HCL 0.2 MG PO TABS
0.2000 mg | ORAL_TABLET | Freq: Two times a day (BID) | ORAL | Status: DC
  Administered 2016-01-02: 0.2 mg via ORAL
  Filled 2016-01-01: qty 1

## 2016-01-01 MED ORDER — GABAPENTIN 400 MG PO CAPS
800.0000 mg | ORAL_CAPSULE | Freq: Three times a day (TID) | ORAL | Status: DC
  Administered 2016-01-02 (×2): 800 mg via ORAL
  Filled 2016-01-01 (×2): qty 2

## 2016-01-01 MED ORDER — SODIUM CHLORIDE 0.9% FLUSH
3.0000 mL | Freq: Two times a day (BID) | INTRAVENOUS | Status: DC
  Administered 2016-01-02: 3 mL via INTRAVENOUS

## 2016-01-01 MED ORDER — CITALOPRAM HYDROBROMIDE 20 MG PO TABS
20.0000 mg | ORAL_TABLET | Freq: Every day | ORAL | Status: DC
  Administered 2016-01-02: 20 mg via ORAL
  Filled 2016-01-01: qty 1

## 2016-01-01 MED ORDER — ONDANSETRON HCL 4 MG PO TABS: 4.0000 mg | ORAL_TABLET | Freq: Four times a day (QID) | ORAL | Status: DC | PRN

## 2016-01-01 MED ORDER — PANTOPRAZOLE SODIUM 40 MG PO TBEC
40.0000 mg | DELAYED_RELEASE_TABLET | Freq: Every day | ORAL | Status: DC
  Administered 2016-01-02: 40 mg via ORAL
  Filled 2016-01-01: qty 1

## 2016-01-01 MED ORDER — SODIUM CHLORIDE 0.9 % IV SOLN
INTRAVENOUS | Status: DC
  Administered 2016-01-02: via INTRAVENOUS

## 2016-01-01 MED ORDER — ONDANSETRON HCL 4 MG/2ML IJ SOLN: 4.0000 mg | Freq: Four times a day (QID) | INTRAMUSCULAR | Status: DC | PRN

## 2016-01-01 MED ORDER — INSULIN GLARGINE 100 UNIT/ML ~~LOC~~ SOLN
20.0000 [IU] | Freq: Every day | SUBCUTANEOUS | Status: DC
  Administered 2016-01-02: 20 [IU] via SUBCUTANEOUS
  Filled 2016-01-01 (×4): qty 0.2

## 2016-01-01 MED ORDER — ASPIRIN 325 MG PO TABS
325.0000 mg | ORAL_TABLET | Freq: Every day | ORAL | Status: DC
  Administered 2016-01-02: 325 mg via ORAL
  Filled 2016-01-01: qty 1

## 2016-01-01 MED ORDER — DIVALPROEX SODIUM 250 MG PO DR TAB
500.0000 mg | DELAYED_RELEASE_TABLET | Freq: Two times a day (BID) | ORAL | Status: DC
  Administered 2016-01-02: 500 mg via ORAL
  Filled 2016-01-01: qty 2

## 2016-01-01 NOTE — ED Notes (Addendum)
CRITICAL VALUE ALERT  Critical value received:  PH 7.31, CO2 55, PO2 62.8, sats 90%, BiCarb 24.8  Date of notification:  01/01/16  Time of notification:  2211  Critical value read back:Yes.    Nurse who received alert:  B. Garner Nashaniels, RN  MD notified (1st page):  Particia NearingHaviland  Time of first page:  2211

## 2016-01-01 NOTE — ED Provider Notes (Signed)
[facility] DEPT Provider Note   CSN: [id number] Arrival date & time: 01/01/16  2150  By signing my name below, I, Dora Sims, attest that this documentation has been prepared under the direction and in the presence of physician practitioner, Isla Pence, MD. Electronically Signed: Dora Sims, Scribe. 01/01/2016. 9:51 PM.  History   Chief Complaint Chief Complaint  Patient presents with  . Drug Overdose    The history is provided by the EMS personnel. No language interpreter was used.     HPI Comments: LEVEL 5 CAVEAT FOR UNRESPONSIVENESS ANTARIO YASUDA is a 40 y.o. male brought in by EMS, with PMHx significant for DM, HTN, and HLD, who presents to the Emergency Department for drug overdose occurring shortly PTA. Per EMS, pt overdosed on xanax tonight. Per EMS, pt's wife said he only took half (1 mg) of his regular dosage (2 mg) tonight. EMS reports pt was in and out of consciousness en route to the ER. EMS states pt received 1 mg narcan x2 en route. EMS reports pt's respirations have improved since receiving narcan. No other complaints noted.  Pt's wife said he was just released from jail on 10/31.  She did not see him use any opiates other than tylenol #3 today.  She said that she was with him the whole day.  Past Medical History:  Diagnosis Date  . Adult ADHD   . Anaphylactic reaction   . Anxiety   . Asthma   . Bipolar disorder ([place])   . Complication of anesthesia    Per patient difficult intubation;  . Diabetes mellitus   . Difficult intubation    Per patient  . Heart valve disorder    s/p echocardiogram  . Hyperlipidemia   . Hypertension   . Morbidly obese ([place])   . OSA (obstructive sleep apnea)   . Transient cerebral ischemia    Unknown    Patient Active Problem List   Diagnosis Date Noted  . Chronic pain syndrome 10/22/2015  . Healthcare maintenance 10/22/2015  . Hoarse voice quality 10/11/2015  . Major depressive disorder, recurrent severe  without psychotic features ([place]) 09/04/2015  . Polysubstance dependence including opioid type drug, episodic abuse ([place]) 09/04/2015  . RUQ abdominal pain   . Cholelithiasis 05/17/2015  . Antisocial personality disorder 04/11/2015  . Opioid use disorder, severe, dependence ([place]) 04/11/2015  . Benzodiazepine dependence ([place]) 04/11/2015  . Tobacco use disorder 04/10/2015  . Narcotic dependency, continuous ([place]) 03/14/2014  . Chronic back pain greater than 3 months duration 01/28/2014  . Gastroesophageal reflux disease with esophagitis 01/28/2014  . Essential hypertension, benign 01/28/2014  . Diabetes mellitus type II, controlled ([place]) 01/28/2014  . Morbid obesity ([place]) 01/28/2014  . TIA (transient ischemic attack) 09/02/2013  . HLD (hyperlipidemia) 08/06/2013  . OSA on CPAP 08/06/2013    Past Surgical History:  Procedure Laterality Date  . CARPAL TUNNEL RELEASE    . ESOPHAGOGASTRODUODENOSCOPY N/A 04/05/2015   Procedure: ESOPHAGOGASTRODUODENOSCOPY (EGD);  Surgeon: Rogene Houston, MD;  Location: [facility];  Service: Endoscopy;  Laterality: N/A;  . ESOPHAGOGASTRODUODENOSCOPY (EGD) WITH PROPOFOL N/A 05/21/2015   Procedure: ESOPHAGOGASTRODUODENOSCOPY (EGD) WITH PROPOFOL;  Surgeon: Ladene Artist, MD;  Location: [facility] ENDOSCOPY;  Service: Endoscopy;  Laterality: N/A;  . NOSE SURGERY    . TOOTH EXTRACTION    . WISDOM TOOTH EXTRACTION         Home Medications    Prior to Admission medications   Medication Sig Start Date End Date Taking? Authorizing Provider  acetaminophen-codeine (TYLENOL #3) 300-30 MG tablet TAKE 1 TABLET BY MOUTH EVERY 4 HOURS AS NEEDED 11/19/15  Yes Tresa Garter, MD  albuterol (PROVENTIL HFA;VENTOLIN HFA) 108 (90 Base) MCG/ACT inhaler Inhale 1-2 puffs into the lungs every 6 (six) hours as needed for wheezing or shortness of breath. 11/20/15  Yes Tiffany Carlota Raspberry, PA-C  alprazolam Duanne Moron) 2 MG tablet Take 2 mg by mouth 3 (three) times daily as needed for sleep or  anxiety.   Yes Historical Provider, MD  amphetamine-dextroamphetamine (ADDERALL) 30 MG tablet Take 60 mg by mouth daily.    Yes Historical Provider, MD  aspirin 325 MG tablet Take 1 tablet (325 mg total) by mouth daily. For heart health 09/05/15  Yes Encarnacion Slates, NP  citalopram (CELEXA) 20 MG tablet Take 1 tablet (20 mg total) by mouth every morning. 10/15/15  Yes Elsie Stain, MD  cloNIDine (CATAPRES) 0.1 MG tablet Take 2 tablets (0.2 mg total) by mouth 2 (two) times daily. 10/22/15  Yes Tresa Garter, MD  divalproex (DEPAKOTE) 500 MG DR tablet Take 500 mg by mouth 2 (two) times daily. 10/02/15  Yes Historical Provider, MD  Fluticasone-Salmeterol (ADVAIR) 250-50 MCG/DOSE AEPB Inhale 1 puff into the lungs 2 (two) times daily.   Yes Historical Provider, MD  gabapentin (NEURONTIN) 400 MG capsule Take 2 capsules (800 mg total) by mouth 3 (three) times daily. 10/15/15  Yes Elsie Stain, MD  ibuprofen (ADVIL,MOTRIN) 200 MG tablet Take 400-600 mg by mouth every 6 (six) hours as needed for mild pain or moderate pain.   Yes Historical Provider, MD  insulin glargine (LANTUS) 100 UNIT/ML injection Inject 0.2 mLs (20 Units total) into the skin at bedtime. 10/15/15  Yes Elsie Stain, MD  metFORMIN (GLUCOPHAGE) 1000 MG tablet Take 1 tablet (1,000 mg total) by mouth 2 (two) times daily. 10/15/15  Yes Elsie Stain, MD  omeprazole (PRILOSEC) 40 MG capsule Take 1 capsule (40 mg total) by mouth 2 (two) times daily. 10/15/15  Yes Elsie Stain, MD  ranitidine (ZANTAC) 300 MG tablet Take 1 tablet (300 mg total) by mouth at bedtime. 10/15/15  Yes Elsie Stain, MD  traZODone (DESYREL) 50 MG tablet Take 1 tablet (50 mg total) by mouth at bedtime. For sleep 10/15/15  Yes Elsie Stain, MD  ACCU-CHEK [facility] LANCETS lancets Use as instructed for 3 times daily testing of blood sugar 11/10/15   Tresa Garter, MD  albuterol (PROVENTIL HFA;VENTOLIN HFA) 108 (90 Base) MCG/ACT inhaler Inhale 2 puffs into  the lungs every 2 (two) hours as needed for wheezing or shortness of breath. Patient not taking: Reported on 01/01/2016 10/27/15   Arnoldo Morale, MD  azithromycin (ZITHROMAX) 250 MG tablet Take 1 tablet (250 mg total) by mouth daily. Take first 2 tablets together, then 1 every day until finished. Patient not taking: Reported on 01/01/2016 11/20/15   Delos Haring, PA-C  blood glucose meter kit and supplies KIT Dispense based on patient and insurance preference. Use up to four times daily as directed. (FOR ICD-9 250.00, 250.01). 10/13/15   Samuella Cota, MD  Blood Glucose Monitoring Suppl (ACCU-CHEK AVIVA PLUS) w/Device KIT Use as directed to check blood sugar 3 times daily. 11/10/15   Tresa Garter, MD  glucose blood (ACCU-CHEK AVIVA) test strip Use as instructed for 3 times daily testing of blood sugar 11/10/15   Tresa Garter, MD  Lancet Devices [facility]) lancets Use as instructed for 3 times daily testing of  blood sugar 11/10/15   Tresa Garter, MD    Family History Family History  Problem Relation Age of Onset  . CAD Mother     Living  . Diabetes Mellitus II Mother   . Stroke Mother   . Hypertension Mother   . Congestive Heart Failure Mother   . Kidney disease Mother   . Fibromyalgia Mother   . Thyroid disease Mother   . Hyperlipidemia Mother   . Liver disease Mother   . Alcoholism Father 48    Deceased  . Arthritis Maternal Grandmother   . Congestive Heart Failure Maternal Grandmother   . Hypertension Maternal Grandmother   . Lung cancer Maternal Grandfather   . Colon cancer Maternal Aunt   . Stomach cancer Maternal Aunt   . Heart disease Other     Paternal & Maternal  . Crohn's disease Other   . Hypertension Other     Paternal & Maternal  . Hypertension Brother     x3  . Hypertension Sister     #1  . Bipolar disorder Sister     #1  . ADD / ADHD Son     x3  . Bipolar disorder Son     x3  . Asperger's syndrome Son     Social  History Social History  Substance Use Topics  . Smoking status: Current Every Day Smoker    Packs/day: 0.50    Years: 23.00    Types: Cigarettes  . Smokeless tobacco: Never Used  . Alcohol use No     Allergies   Atenolol; Lisinopril; Prednisone; and Tylenol [acetaminophen]   Review of Systems Review of Systems  Unable to perform ROS: Patient unresponsive  All other systems reviewed and are negative.   Physical Exam Updated Vital Signs BP 143/87   Pulse 106   Temp 98.4 F (36.9 C) (Oral)   Resp 17   SpO2 99%   Physical Exam  Constitutional: He appears well-developed and well-nourished. He appears lethargic.  Unresponsive.  HENT:  Head: Normocephalic and atraumatic.  Eyes: Conjunctivae are normal.  Neck: Neck supple. No tracheal deviation present.  Cardiovascular: Regular rhythm and intact distal pulses.  Tachycardia present.   Pulmonary/Chest: Effort normal. No respiratory distress.  Musculoskeletal: Normal range of motion.  Neurological: He appears lethargic.  Skin: Skin is warm and dry.  Track marks all over arms  Nursing note and vitals reviewed.   ED Treatments / Results  Labs (all labs ordered are listed, but only abnormal results are displayed) Labs Reviewed  BLOOD GAS, ARTERIAL - Abnormal; Notable for the following:       Result Value   pH, Arterial 7.317 (*)    pCO2 arterial 55.0 (*)    pO2, Arterial 62.8 (*)    Allens test (pass/fail) POSITIVE (*)    All other components within normal limits  COMPREHENSIVE METABOLIC PANEL - Abnormal; Notable for the following:    Sodium 132 (*)    Chloride 99 (*)    Glucose, Bld 241 (*)    Calcium 8.4 (*)    AST 46 (*)    All other components within normal limits  RAPID URINE DRUG SCREEN, HOSP PERFORMED - Abnormal; Notable for the following:    Opiates POSITIVE (*)    Benzodiazepines POSITIVE (*)    Amphetamines POSITIVE (*)    All other components within normal limits  URINALYSIS, ROUTINE W REFLEX  MICROSCOPIC (NOT AT [facility]) - Abnormal; Notable for the following:    Specific Gravity,  Urine >1.030 (*)    Protein, ur 100 (*)    All other components within normal limits  URINE MICROSCOPIC-ADD ON - Abnormal; Notable for the following:    Squamous Epithelial / LPF 0-5 (*)    Bacteria, UA MANY (*)    Casts GRANULAR CAST (*)    All other components within normal limits  CBG MONITORING, ED - Abnormal; Notable for the following:    Glucose-Capillary 223 (*)    All other components within normal limits  I-STAT CHEM 8, ED - Abnormal; Notable for the following:    Chloride 97 (*)    Glucose, Bld 239 (*)    Calcium, Ion 1.05 (*)    All other components within normal limits  ETHANOL  CBC WITH DIFFERENTIAL/PLATELET    EKG  EKG Interpretation  Date/Time:  Thursday January 01 2016 21:52:00 EDT Ventricular Rate:  118 PR Interval:    QRS Duration: 91 QT Interval:  336 QTC Calculation: 471 R Axis:   84 Text Interpretation:  Sinus tachycardia Nonspecific T abnormalities, inferior leads Confirmed by Jamorion Gomillion MD, Ayanna Gheen ([id number]) on 01/01/2016 10:18:01 PM       Radiology Ct Head Wo Contrast  Result Date: 01/01/2016 CLINICAL DATA:  Acute onset of altered mental status. Overdose on Xanax. Patient unresponsive. Insert normal EXAM: CT HEAD WITHOUT CONTRAST TECHNIQUE: Contiguous axial images were obtained from the base of the skull through the vertex without intravenous contrast. COMPARISON:  CT of the head performed 08/21/2015, and MRI of the brain performed 04/08/2015 FINDINGS: Brain: No evidence of acute infarction, hemorrhage, hydrocephalus, extra-axial collection or mass lesion/mass effect. The posterior fossa, including the cerebellum, brainstem and fourth ventricle, is within normal limits. The third and lateral ventricles, and basal ganglia are unremarkable in appearance. The cerebral hemispheres are symmetric in appearance, with normal gray-white differentiation. No mass effect or midline shift  is seen. Vascular: No hyperdense vessel or unexpected calcification. Skull: There is no evidence of fracture; visualized osseous structures are unremarkable in appearance. Sinuses/Orbits: The orbits are within normal limits. The paranasal sinuses and mastoid air cells are well-aerated. Other: No significant soft tissue abnormalities are seen. IMPRESSION: Unremarkable noncontrast CT of the head. Electronically Signed   By: Garald Balding M.D.   On: 01/01/2016 23:14   Dg Chest Port 1 View  Result Date: 01/01/2016 CLINICAL DATA:  Initial evaluation for acute altered mental status. EXAM: PORTABLE CHEST 1 VIEW COMPARISON:  Prior radiograph from 11/19/2015. FINDINGS: Mild cardiomegaly, stable. Mediastinal silhouette within normal limits. Lungs mildly hypoinflated. Diffuse vascular congestion without overt pulmonary edema. Slightly more confluent patchy bibasilar opacities, which may reflect atelectasis or possibly infiltrates. No pleural effusion. No pneumothorax. No acute osseous abnormality. IMPRESSION: 1. Shallow lung inflation with mild patchy bibasilar opacities, which may reflect atelectasis or infiltrates. Aspiration could be considered as well. 2. Mild cardiomegaly with diffuse pulmonary vascular congestion. No overt pulmonary edema. Electronically Signed   By: Jeannine Boga M.D.   On: 01/01/2016 22:32    Procedures IO LINE INSERTION Date/Time: 01/01/2016 10:51 PM Performed by: Isla Pence Authorized by: Isla Pence   Consent:    Consent obtained:  Emergent situation Pre-procedure details:    Site preparation:  Povidone-iodine Procedure details:    Insertion site:  R proximal tibia   Insertion device:  Drill device   Insertion: Needle was inserted through the bony cortex     Number of attempts:  1   Insertion confirmation:  Aspiration of blood/marrow, easy infusion of fluids and stability of  the needle Post-procedure details:    Secured with:  Protective shield, tape and  transparent dressing   Patient tolerance of procedure:  Tolerated well, no immediate complications  Aspiration of blood/fluid Date/Time: 01/01/2016 11:21 PM Performed by: Isla Pence Authorized by: Isla Pence  Consent: The procedure was performed in an emergent situation. Patient identity confirmed: arm band Patient tolerance: Patient tolerated the procedure well with no immediate complications Comments: Pt was a very hard blood stick, so I stuck his right femoral a. For blood.    (including critical care time)  DIAGNOSTIC STUDIES: Oxygen Saturation is 98% on RA, normal by my interpretation.    Medications Ordered in ED Medications  naloxone (NARCAN) 2 MG/2ML injection (not administered)  sodium chloride 0.9 % bolus 1,000 mL (1,000 mLs Intravenous New Bag/Given 01/01/16 2256)    And  0.9 %  sodium chloride infusion (not administered)     Initial Impression / Assessment and Plan / ED Course  I have reviewed the triage vital signs and the nursing notes.  Pertinent labs & imaging results that were available during my care of the patient were reviewed by me and considered in my medical decision making (see chart for details).  Clinical Course   CRITICAL CARE Performed by: Isla Pence   Total critical care time: 30 minutes  Critical care time was exclusive of separately billable procedures and treating other patients.  Critical care was necessary to treat or prevent imminent or life-threatening deterioration.  Critical care was time spent personally by me on the following activities: development of treatment plan with patient and/or surrogate as well as nursing, discussions with consultants, evaluation of patient's response to treatment, examination of patient, obtaining history from patient or surrogate, ordering and performing treatments and interventions, ordering and review of laboratory studies, ordering and review of radiographic studies, pulse oximetry and  re-evaluation of patient's condition.   I personally performed the services described in this documentation, which was scribed in my presence. The recorded information has been reviewed and is accurate.   Pt is becoming more responsive, but he is still very lethargic.  I don't think this will get better quickly, so I will speak with the hospitalists for admission.  Dr. Myna Hidalgo will admit.  Final Clinical Impressions(s) / ED Diagnoses   Final diagnoses:  Drug overdose, undetermined intent, initial encounter    New Prescriptions New Prescriptions   No medications on file     Isla Pence, MD 01/01/16 2332

## 2016-01-01 NOTE — H&P (Signed)
History and Physical    Bryan Gallegos:[id number] DOB: December 07, 1975 DOA: 01/01/2016  PCP: No PCP Per Patient   Patient coming from: Home  Chief Complaint: Found unresponsive  HPI: Bryan Gallegos is a 40 y.o. male with medical history significant for Bipolar disorder, asthma, morbid obesity, OSA, and polysubstance abuse with history of narcotic overdose presenting with EMS after being found unresponsive by his wife. Patient is unable to contribute to the history secondary to his clinical state, and history is therefore obtained through report of patient's wife, EMS, ED personnel, and review of the EMR. The patient had just been home for prison for a couple days and appeared to be in his usual state until this evening when his wife was unable to wake him. She reports spending the day with him and is confident that he did not use any opiate other than a Tylenol #3. He took his prescription Xanax. He had not voiced any specific complaint earlier in the day, was not coughing, and seemed to be in his usual state. He was given 2 mg Narcan by EMS with slight improvement in LOC reported.     ED Course: Upon arrival to the ED, patient is found to be afebrile, saturating well on 2 Lpm supplemental O2, tachycardic in low 100s, and with vitals otherwise stable. EKG featured a sinus tachycardia with rate 118 and a non-specific T-wave abnormality in inferior leads. CXR with pulmonary vascular congestion and patchy bibasilar opacities likely representing atelectasis, or possibly aspiration. Chemistry panel is notable for a mild hyponatremia and hypochloremia, H&H is unremarkable, and UA features an elevated specific gravity. Additional narcan was administered in the ED. The patient remained very somnolent, but hemodynamically stable. He will be observed in the stepdown unit for ongoing evaluation and management of acute encephalopathy.   Review of Systems:  Unable to obtain secondary to the clinical  condition with pt poorly responsive.  Past Medical History:  Diagnosis Date  . Adult ADHD   . Anaphylactic reaction   . Anxiety   . Asthma   . Bipolar disorder ([place])   . Complication of anesthesia    Per patient difficult intubation;  . Diabetes mellitus   . Difficult intubation    Per patient  . Heart valve disorder    s/p echocardiogram  . Hyperlipidemia   . Hypertension   . Morbidly obese ([place])   . OSA (obstructive sleep apnea)   . Transient cerebral ischemia    Unknown    Past Surgical History:  Procedure Laterality Date  . CARPAL TUNNEL RELEASE    . ESOPHAGOGASTRODUODENOSCOPY N/A 04/05/2015   Procedure: ESOPHAGOGASTRODUODENOSCOPY (EGD);  Surgeon: Rogene Houston, MD;  Location: [facility];  Service: Endoscopy;  Laterality: N/A;  . ESOPHAGOGASTRODUODENOSCOPY (EGD) WITH PROPOFOL N/A 05/21/2015   Procedure: ESOPHAGOGASTRODUODENOSCOPY (EGD) WITH PROPOFOL;  Surgeon: Ladene Artist, MD;  Location: [facility];  Service: Endoscopy;  Laterality: N/A;  . NOSE SURGERY    . TOOTH EXTRACTION    . WISDOM TOOTH EXTRACTION       reports that he has been smoking Cigarettes.  He has a 11.50 pack-year smoking history. He has never used smokeless tobacco. He reports that he does not drink alcohol or use drugs.  Allergies  Allergen Reactions  . Atenolol Anaphylaxis  . Lisinopril Anaphylaxis    NO ACE INHIBITORS!!!  . Prednisone Hives  . Tylenol [Acetaminophen] Other (See Comments)    GI Bleed    Family History  Problem Relation Age  of Onset  . CAD Mother     Living  . Diabetes Mellitus II Mother   . Stroke Mother   . Hypertension Mother   . Congestive Heart Failure Mother   . Kidney disease Mother   . Fibromyalgia Mother   . Thyroid disease Mother   . Hyperlipidemia Mother   . Liver disease Mother   . Alcoholism Father 66    Deceased  . Arthritis Maternal Grandmother   . Congestive Heart Failure Maternal Grandmother   . Hypertension Maternal Grandmother   . Lung  cancer Maternal Grandfather   . Colon cancer Maternal Aunt   . Stomach cancer Maternal Aunt   . Heart disease Other     Paternal & Maternal  . Crohn's disease Other   . Hypertension Other     Paternal & Maternal  . Hypertension Brother     x3  . Hypertension Sister     #1  . Bipolar disorder Sister     #1  . ADD / ADHD Son     x3  . Bipolar disorder Son     x3  . Asperger's syndrome Son      Prior to Admission medications   Medication Sig Start Date End Date Taking? Authorizing Provider  acetaminophen-codeine (TYLENOL #3) 300-30 MG tablet TAKE 1 TABLET BY MOUTH EVERY 4 HOURS AS NEEDED 11/19/15  Yes Tresa Garter, MD  albuterol (PROVENTIL HFA;VENTOLIN HFA) 108 (90 Base) MCG/ACT inhaler Inhale 1-2 puffs into the lungs every 6 (six) hours as needed for wheezing or shortness of breath. 11/20/15  Yes Tiffany Carlota Raspberry, PA-C  alprazolam Duanne Moron) 2 MG tablet Take 2 mg by mouth 3 (three) times daily as needed for sleep or anxiety.   Yes Historical Provider, MD  amphetamine-dextroamphetamine (ADDERALL) 30 MG tablet Take 60 mg by mouth daily.    Yes Historical Provider, MD  aspirin 325 MG tablet Take 1 tablet (325 mg total) by mouth daily. For heart health 09/05/15  Yes Encarnacion Slates, NP  citalopram (CELEXA) 20 MG tablet Take 1 tablet (20 mg total) by mouth every morning. 10/15/15  Yes Elsie Stain, MD  cloNIDine (CATAPRES) 0.1 MG tablet Take 2 tablets (0.2 mg total) by mouth 2 (two) times daily. 10/22/15  Yes Tresa Garter, MD  divalproex (DEPAKOTE) 500 MG DR tablet Take 500 mg by mouth 2 (two) times daily. 10/02/15  Yes Historical Provider, MD  Fluticasone-Salmeterol (ADVAIR) 250-50 MCG/DOSE AEPB Inhale 1 puff into the lungs 2 (two) times daily.   Yes Historical Provider, MD  gabapentin (NEURONTIN) 400 MG capsule Take 2 capsules (800 mg total) by mouth 3 (three) times daily. 10/15/15  Yes Elsie Stain, MD  ibuprofen (ADVIL,MOTRIN) 200 MG tablet Take 400-600 mg by mouth every 6 (six)  hours as needed for mild pain or moderate pain.   Yes Historical Provider, MD  insulin glargine (LANTUS) 100 UNIT/ML injection Inject 0.2 mLs (20 Units total) into the skin at bedtime. 10/15/15  Yes Elsie Stain, MD  metFORMIN (GLUCOPHAGE) 1000 MG tablet Take 1 tablet (1,000 mg total) by mouth 2 (two) times daily. 10/15/15  Yes Elsie Stain, MD  omeprazole (PRILOSEC) 40 MG capsule Take 1 capsule (40 mg total) by mouth 2 (two) times daily. 10/15/15  Yes Elsie Stain, MD  ranitidine (ZANTAC) 300 MG tablet Take 1 tablet (300 mg total) by mouth at bedtime. 10/15/15  Yes Elsie Stain, MD  traZODone (DESYREL) 50 MG tablet Take 1 tablet (50  mg total) by mouth at bedtime. For sleep 10/15/15  Yes Elsie Stain, MD  ACCU-CHEK [facility] LANCETS lancets Use as instructed for 3 times daily testing of blood sugar 11/10/15   Tresa Garter, MD  albuterol (PROVENTIL HFA;VENTOLIN HFA) 108 (90 Base) MCG/ACT inhaler Inhale 2 puffs into the lungs every 2 (two) hours as needed for wheezing or shortness of breath. Patient not taking: Reported on 01/01/2016 10/27/15   Arnoldo Morale, MD  azithromycin (ZITHROMAX) 250 MG tablet Take 1 tablet (250 mg total) by mouth daily. Take first 2 tablets together, then 1 every day until finished. Patient not taking: Reported on 01/01/2016 11/20/15   Delos Haring, PA-C  blood glucose meter kit and supplies KIT Dispense based on patient and insurance preference. Use up to four times daily as directed. (FOR ICD-9 250.00, 250.01). 10/13/15   Samuella Cota, MD  Blood Glucose Monitoring Suppl (ACCU-CHEK AVIVA PLUS) w/Device KIT Use as directed to check blood sugar 3 times daily. 11/10/15   Tresa Garter, MD  glucose blood (ACCU-CHEK AVIVA) test strip Use as instructed for 3 times daily testing of blood sugar 11/10/15   Tresa Garter, MD  Lancet Devices [facility]) lancets Use as instructed for 3 times daily testing of blood sugar 11/10/15   Tresa Garter,  MD    Physical Exam: Vitals:   01/01/16 2221 01/01/16 2230 01/01/16 2300 01/01/16 2330  BP: 138/80 127/71 143/87 144/94  Pulse: 106 109 106 109  Resp: _0 Temp: 98.4 F (36.9 C)     TempSrc: Oral     SpO2: 98% 95% 99% 98%      Constitutional: Obese, disheveled, obtunded Eyes: PERTLA, lids and conjunctivae normal ENMT: Mucous membranes are moist. Posterior pharynx clear of any exudate or lesions.   Neck: normal, supple, no masses, no thyromegaly Respiratory: clear to auscultation bilaterally, no wheezing, no crackles. No accessory muscle use.  Cardiovascular: Rate ~110 and regular. No extremity edema. No significant JVD. Abdomen: No distension, no tenderness, no masses palpated. Bowel sounds normal.  Musculoskeletal: no clubbing / cyanosis. No joint deformity upper and lower extremities. Normal muscle tone.  Skin: Hyperpigmentation of lower LEs in gaiter distribution. Puncture marks overly veins of UEs. Warm, dry, well-perfused. Neurologic: No gross facial asymmetry, obtunded, wakes to painful stimuli only. Patellar DTRs wnl and Babinski down-going bilaterally.  Psychiatric: Unable to assess on admission given the clinical scenario.     Labs on Admission: I have personally reviewed following labs and imaging studies  CBC:  Recent Labs Lab 01/01/16 2216  HGB 14.6  HCT 26.4   Basic Metabolic Panel:  Recent Labs Lab 01/01/16 2211 01/01/16 2216  NA 132* 139  K 3.9 4.3  CL 99* 97*  CO2 26  --   GLUCOSE 241* 239*  BUN 11 13  CREATININE 0.88 0.90  CALCIUM 8.4*  --    GFR: CrCl cannot be calculated (Unknown ideal weight.). Liver Function Tests:  Recent Labs Lab 01/01/16 2211  AST 46*  ALT 42  ALKPHOS 89  BILITOT 0.4  PROT 7.6  ALBUMIN 3.8   No results for input(s): LIPASE, AMYLASE in the last 168 hours. No results for input(s): AMMONIA in the last 168 hours. Coagulation Profile: No results for input(s): INR, PROTIME in the last 168  hours. Cardiac Enzymes: No results for input(s): CKTOTAL, CKMB, CKMBINDEX, TROPONINI in the last 168 hours. BNP (last 3 results) No results for input(s): PROBNP in the last 8760 hours.  HbA1C: No results for input(s): HGBA1C in the last 72 hours. CBG:  Recent Labs Lab 01/01/16 2203  GLUCAP 223*   Lipid Profile: No results for input(s): CHOL, HDL, LDLCALC, TRIG, CHOLHDL, LDLDIRECT in the last 72 hours. Thyroid Function Tests: No results for input(s): TSH, T4TOTAL, FREET4, T3FREE, THYROIDAB in the last 72 hours. Anemia Panel: No results for input(s): VITAMINB12, FOLATE, FERRITIN, TIBC, IRON, RETICCTPCT in the last 72 hours. Urine analysis:    Component Value Date/Time   COLORURINE YELLOW 01/01/2016 2209   APPEARANCEUR CLEAR 01/01/2016 2209   LABSPEC >1.030 (H) 01/01/2016 2209   PHURINE 5.5 01/01/2016 2209   GLUCOSEU NEGATIVE 01/01/2016 2209   HGBUR NEGATIVE 01/01/2016 2209   BILIRUBINUR NEGATIVE 01/01/2016 2209   KETONESUR NEGATIVE 01/01/2016 2209   PROTEINUR 100 (A) 01/01/2016 2209   UROBILINOGEN 1.0 08/18/2014 2002   NITRITE NEGATIVE 01/01/2016 2209   LEUKOCYTESUR NEGATIVE 01/01/2016 2209   Sepsis Labs: _0 (procalcitonin:4,lacticidven:4) )No results found for this or any previous visit (from the past 240 hour(s)).   Radiological Exams on Admission: Ct Head Wo Contrast  Result Date: 01/01/2016 CLINICAL DATA:  Acute onset of altered mental status. Overdose on Xanax. Patient unresponsive. Insert normal EXAM: CT HEAD WITHOUT CONTRAST TECHNIQUE: Contiguous axial images were obtained from the base of the skull through the vertex without intravenous contrast. COMPARISON:  CT of the head performed 08/21/2015, and MRI of the brain performed 04/08/2015 FINDINGS: Brain: No evidence of acute infarction, hemorrhage, hydrocephalus, extra-axial collection or mass lesion/mass effect. The posterior fossa, including the cerebellum, brainstem and fourth ventricle, is within normal  limits. The third and lateral ventricles, and basal ganglia are unremarkable in appearance. The cerebral hemispheres are symmetric in appearance, with normal gray-white differentiation. No mass effect or midline shift is seen. Vascular: No hyperdense vessel or unexpected calcification. Skull: There is no evidence of fracture; visualized osseous structures are unremarkable in appearance. Sinuses/Orbits: The orbits are within normal limits. The paranasal sinuses and mastoid air cells are well-aerated. Other: No significant soft tissue abnormalities are seen. IMPRESSION: Unremarkable noncontrast CT of the head. Electronically Signed   By: Garald Balding M.D.   On: 01/01/2016 23:14   Dg Chest Port 1 View  Result Date: 01/01/2016 CLINICAL DATA:  Initial evaluation for acute altered mental status. EXAM: PORTABLE CHEST 1 VIEW COMPARISON:  Prior radiograph from 11/19/2015. FINDINGS: Mild cardiomegaly, stable. Mediastinal silhouette within normal limits. Lungs mildly hypoinflated. Diffuse vascular congestion without overt pulmonary edema. Slightly more confluent patchy bibasilar opacities, which may reflect atelectasis or possibly infiltrates. No pleural effusion. No pneumothorax. No acute osseous abnormality. IMPRESSION: 1. Shallow lung inflation with mild patchy bibasilar opacities, which may reflect atelectasis or infiltrates. Aspiration could be considered as well. 2. Mild cardiomegaly with diffuse pulmonary vascular congestion. No overt pulmonary edema. Electronically Signed   By: Jeannine Boga M.D.   On: 01/01/2016 22:32    EKG: Independently reviewed. Sinus tachycardia (rate 118), non-specific T-wave abnormality in inferior leads  Assessment/Plan  1. Acute encephalopathy  - Pt had apparently been in his usual state earlier in the day before his wife was unable to wake him - Head CT unremarkable; no fever or leukocytosis to suggest infectious etiology; hypercarbia on ABG and mild hyponatremia noted;  EtOH <5; TSH pending  - Suspect this is may be secondary to drug overdose; he reportedly improved transiently with Narcan  - Monitor in stepdown unit with supportive care    2. Polysubstance abuse  - UDS positive for amphetamines, benzodiazepine, and opiate (  his wife reports he is prescribed Adderall, Tylenol #3, and Xanax) - SW consultation requested as current admission suspected secondary to overdose   3. Hyponatremia  - Serum sodium 132 on admission - He appears slightly volume-up on admission  - He was given 1 liter of NS in ED  - SLIV, follow I/Os, repeat chem panel in am    4. OSA  - Continue CPAP qHS    5. Type II DM  - A1c 7.0% in August 2017  - Managed at home with Lantus 20 units qHS and metformin - Check CBG q4h while NPO  - Hold metformin while in hospital, continue Lanuts 20 units qHS  - Add a moderate-intensity SSI correctional; adjust prn    6. Hypertension  - At goal on admission  - Plan to resume clonidine once he is appropriate for oral intake    7. GERD - Hx of esophagitis managed with BID Prilosec and Zantac at home - Will continue BID PPI and H2-blocker; Pepcid 20 mg IV q12h for now while NPO     DVT prophylaxis: sq Lovenox  Code Status: Full  Family Communication: None available  Disposition Plan: Observe in stepdown Consults called: None Admission status: Observation    Vianne Bulls, MD [facility] Pager [phone]  If 7PM-7AM, please contact night-coverage [email or web address] Password [facility]  01/01/2016, 11:57 PM

## 2016-01-01 NOTE — ED Triage Notes (Signed)
Found unresponsive by wife, unable to access IV, Narcan 2 mg sublingual. Patient barely responsive.

## 2016-01-02 ENCOUNTER — Encounter (HOSPITAL_COMMUNITY): Payer: Self-pay | Admitting: *Deleted

## 2016-01-02 ENCOUNTER — Observation Stay (HOSPITAL_COMMUNITY): Payer: Medicare Other

## 2016-01-02 DIAGNOSIS — E871 Hypo-osmolality and hyponatremia: Secondary | ICD-10-CM | POA: Diagnosis present

## 2016-01-02 DIAGNOSIS — G9340 Encephalopathy, unspecified: Secondary | ICD-10-CM

## 2016-01-02 DIAGNOSIS — Z794 Long term (current) use of insulin: Secondary | ICD-10-CM | POA: Diagnosis not present

## 2016-01-02 DIAGNOSIS — I10 Essential (primary) hypertension: Secondary | ICD-10-CM | POA: Diagnosis present

## 2016-01-02 DIAGNOSIS — K219 Gastro-esophageal reflux disease without esophagitis: Secondary | ICD-10-CM | POA: Diagnosis present

## 2016-01-02 DIAGNOSIS — T424X1A Poisoning by benzodiazepines, accidental (unintentional), initial encounter: Secondary | ICD-10-CM | POA: Diagnosis present

## 2016-01-02 DIAGNOSIS — J45909 Unspecified asthma, uncomplicated: Secondary | ICD-10-CM | POA: Diagnosis present

## 2016-01-02 DIAGNOSIS — F319 Bipolar disorder, unspecified: Secondary | ICD-10-CM | POA: Diagnosis present

## 2016-01-02 DIAGNOSIS — Y92009 Unspecified place in unspecified non-institutional (private) residence as the place of occurrence of the external cause: Secondary | ICD-10-CM | POA: Diagnosis not present

## 2016-01-02 DIAGNOSIS — E119 Type 2 diabetes mellitus without complications: Secondary | ICD-10-CM | POA: Diagnosis present

## 2016-01-02 DIAGNOSIS — Z888 Allergy status to other drugs, medicaments and biological substances status: Secondary | ICD-10-CM | POA: Diagnosis not present

## 2016-01-02 DIAGNOSIS — F419 Anxiety disorder, unspecified: Secondary | ICD-10-CM | POA: Diagnosis present

## 2016-01-02 DIAGNOSIS — E785 Hyperlipidemia, unspecified: Secondary | ICD-10-CM | POA: Diagnosis present

## 2016-01-02 DIAGNOSIS — G4733 Obstructive sleep apnea (adult) (pediatric): Secondary | ICD-10-CM | POA: Diagnosis present

## 2016-01-02 DIAGNOSIS — Z79899 Other long term (current) drug therapy: Secondary | ICD-10-CM | POA: Diagnosis not present

## 2016-01-02 DIAGNOSIS — Z833 Family history of diabetes mellitus: Secondary | ICD-10-CM | POA: Diagnosis not present

## 2016-01-02 DIAGNOSIS — T50904A Poisoning by unspecified drugs, medicaments and biological substances, undetermined, initial encounter: Secondary | ICD-10-CM | POA: Diagnosis present

## 2016-01-02 DIAGNOSIS — Z6841 Body Mass Index (BMI) 40.0 and over, adult: Secondary | ICD-10-CM | POA: Diagnosis not present

## 2016-01-02 DIAGNOSIS — F909 Attention-deficit hyperactivity disorder, unspecified type: Secondary | ICD-10-CM | POA: Diagnosis present

## 2016-01-02 DIAGNOSIS — T50901A Poisoning by unspecified drugs, medicaments and biological substances, accidental (unintentional), initial encounter: Secondary | ICD-10-CM | POA: Insufficient documentation

## 2016-01-02 DIAGNOSIS — Z8249 Family history of ischemic heart disease and other diseases of the circulatory system: Secondary | ICD-10-CM | POA: Diagnosis not present

## 2016-01-02 DIAGNOSIS — G92 Toxic encephalopathy: Secondary | ICD-10-CM | POA: Diagnosis present

## 2016-01-02 DIAGNOSIS — Z7982 Long term (current) use of aspirin: Secondary | ICD-10-CM | POA: Diagnosis not present

## 2016-01-02 LAB — CBC WITH DIFFERENTIAL/PLATELET
BASOS ABS: 0 10*3/uL (ref 0.0–0.1)
BASOS PCT: 0 %
EOS ABS: 0.1 10*3/uL (ref 0.0–0.7)
Eosinophils Relative: 1 %
HEMATOCRIT: 41.5 % (ref 39.0–52.0)
HEMOGLOBIN: 14 g/dL (ref 13.0–17.0)
Lymphocytes Relative: 25 %
Lymphs Abs: 2.4 10*3/uL (ref 0.7–4.0)
MCH: 31.5 pg (ref 26.0–34.0)
MCHC: 33.7 g/dL (ref 30.0–36.0)
MCV: 93.3 fL (ref 78.0–100.0)
MONO ABS: 0.9 10*3/uL (ref 0.1–1.0)
Monocytes Relative: 10 %
NEUTROS ABS: 6.2 10*3/uL (ref 1.7–7.7)
NEUTROS PCT: 64 %
Platelets: 178 10*3/uL (ref 150–400)
RBC: 4.45 MIL/uL (ref 4.22–5.81)
RDW: 13.1 % (ref 11.5–15.5)
WBC: 9.5 10*3/uL (ref 4.0–10.5)

## 2016-01-02 LAB — BASIC METABOLIC PANEL
Anion gap: 6 (ref 5–15)
BUN: 8 mg/dL (ref 6–20)
CHLORIDE: 102 mmol/L (ref 101–111)
CO2: 29 mmol/L (ref 22–32)
CREATININE: 0.69 mg/dL (ref 0.61–1.24)
Calcium: 8.6 mg/dL — ABNORMAL LOW (ref 8.9–10.3)
GFR calc Af Amer: >60 mL/min (ref >60)
GLUCOSE: 109 mg/dL — AB (ref 65–99)
Potassium: 3.6 mmol/L (ref 3.5–5.1)
SODIUM: 137 mmol/L (ref 135–145)

## 2016-01-02 LAB — GLUCOSE, CAPILLARY
GLUCOSE-CAPILLARY: 108 mg/dL — AB (ref 65–99)
Glucose-Capillary: 194 mg/dL — ABNORMAL HIGH (ref 65–99)
Glucose-Capillary: 219 mg/dL — ABNORMAL HIGH (ref 65–99)
Glucose-Capillary: 289 mg/dL — ABNORMAL HIGH (ref 65–99)

## 2016-01-02 LAB — PROCALCITONIN: Procalcitonin: 0.14 ng/mL

## 2016-01-02 LAB — MRSA PCR SCREENING: MRSA BY PCR: NEGATIVE

## 2016-01-02 LAB — TYPE AND SCREEN
ABO/RH(D): O NEG
Antibody Screen: NEGATIVE

## 2016-01-02 LAB — TSH: TSH: 2.406 u[IU]/mL (ref 0.350–4.500)

## 2016-01-02 MED ORDER — DIPHENHYDRAMINE HCL 50 MG/ML IJ SOLN
50.0000 mg | Freq: Three times a day (TID) | INTRAMUSCULAR | Status: DC | PRN
Start: 2016-01-02 — End: 2016-01-02
  Administered 2016-01-02: 50 mg via INTRAVENOUS
  Filled 2016-01-02: qty 1

## 2016-01-02 MED ORDER — METHYLPREDNISOLONE SODIUM SUCC 125 MG IJ SOLR
80.0000 mg | Freq: Two times a day (BID) | INTRAMUSCULAR | Status: DC
  Administered 2016-01-02: 80 mg via INTRAVENOUS
  Filled 2016-01-02: qty 2

## 2016-01-02 MED ORDER — LORAZEPAM 2 MG/ML IJ SOLN
1.0000 mg | INTRAMUSCULAR | Status: DC | PRN
  Administered 2016-01-02: 1 mg via INTRAVENOUS
  Filled 2016-01-02: qty 1

## 2016-01-02 MED ORDER — MOMETASONE FURO-FORMOTEROL FUM 200-5 MCG/ACT IN AERO
INHALATION_SPRAY | RESPIRATORY_TRACT | Status: AC
  Filled 2016-01-02: qty 8.8

## 2016-01-02 MED ORDER — RANITIDINE HCL 150 MG/10ML PO SYRP: 150.0000 mg | ORAL_SOLUTION | Freq: Two times a day (BID) | ORAL | Status: DC

## 2016-01-02 MED ORDER — SODIUM CHLORIDE 0.9 % IV SOLN
INTRAVENOUS | Status: DC
  Administered 2016-01-02: 19:00:00 via INTRAVENOUS

## 2016-01-02 MED ORDER — DEXAMETHASONE SODIUM PHOSPHATE 10 MG/ML IJ SOLN
10.0000 mg | Freq: Once | INTRAMUSCULAR | Status: AC
  Administered 2016-01-02: 10 mg via INTRAVENOUS
  Filled 2016-01-02: qty 1

## 2016-01-02 NOTE — Progress Notes (Signed)
01/02/16  1933  Paged Dr Thedore MinsSingh to notify that patient is requesting to leave and wants to sign out AMA.

## 2016-01-02 NOTE — Clinical Social Work Note (Addendum)
Patient slept through a procedure. Patient was in a deep sleep and could not be aroused. CSW will reattempt to assess.       Bryan Gallegos, Juleen ChinaHeather D, LCSW

## 2016-01-02 NOTE — Progress Notes (Signed)
01/02/16  1445  Patient is NPO except sips with meds. Patient has been advised that he is NPO due to throat swelling. I have walked in while patient's wife was eating a salad, the patient was chewing and states he was eating tomatoes. Next, time patient was sleep in the chair with choc cake crumbs around mouth. Patient admits to eating cake. MD notified.

## 2016-01-02 NOTE — Care Management (Signed)
Pt lethargic and assessment of needs unable to be completed at this time.

## 2016-01-02 NOTE — Progress Notes (Signed)
Patient signed AMA papers and left with his family at 740pm. Dr Thedore MinsSingh paged and notified by AM primary nurse Vivi Fernsekina Carpenter RN

## 2016-01-02 NOTE — Progress Notes (Signed)
PROGRESS NOTE                                                                                                                                                                                                             Patient Demographics:    Bryan Gallegos, is a 40 y.o. male, DOB - [birth year], ION:[id number]  Admit date - 01/01/2016   Admitting Physician Briscoe Deutscher, MD  Outpatient Primary MD for the patient is No PCP Per Patient  LOS - 0  Chief Complaint  Patient presents with  . Drug Overdose       Brief Narrative   Bryan Gallegos is a 40 y.o. male with medical history significant for Bipolar disorder, asthma, morbid obesity, OSA, and polysubstance abuse with history of narcotic overdose presenting with EMS after being found unresponsive by his wife. Patient is unable to contribute to the history secondary to his clinical state, and history is therefore obtained through report of patient's wife, EMS, ED personnel, and review of the EMR. The patient had just been home for prison for a couple days and appeared to be in his usual state until this evening when his wife was unable to wake him. She reports spending the day with him and is confident that he did not use any opiate other than a Tylenol #3. He took his prescription Xanax   Subjective:    Bryan Gallegos today has, No headache, No chest pain, No abdominal pain - No Nausea, No new weakness tingling or numbness, No Cough - SOB.    Assessment  & Plan :      1.Acute encephalopathy most likely due to accidental polypharmacy. Head CT unremarkable, no focal deficits, he is close to his baseline with almost resolved encephalopathy. Counseled not to overuse his prescription narcotic and benzodiazepine.  2. Morbid obesity, OSA. CPAP bedtime, follow with PCP for weight loss.  3. Swelling of his uvula. This has been a chronic problem and has happened 5 times in  the past due to unknown allergy, also required intubation for this in the past, have stopped all unnecessary medications including Lovenox, given him Benadryl IV, Solu-Medrol IV, Zantac IV, discussed his case with pulmonary Dr. Juanetta Gosling, if worse we'll try FFP if all fails and he gets worse clinically than intubation. Currently he is not short of breath, no  stridor, he is not working hard for breathing at all. We will monitor.  3. HTN. Continue clonidine.  4. GERD. Continue PPI.  5. DM type II. A1c recently was 7. Continue Lantus and sliding scale. Sugars can be high due to owing steroid use for #3 above.    Family Communication  :  wife  Code Status :  Full  Diet : NPO  Disposition Plan  :  Stepdown  Consults  :  Pulm Dr Juanetta Goslinghawkins curbside  Procedures  :    CT Head non acute  DVT Prophylaxis  :   SCDs    Lab Results  Component Value Date   PLT 178 01/01/2016    Inpatient Medications  Scheduled Meds: . aspirin  325 mg Oral Daily  . citalopram  20 mg Oral Daily  . cloNIDine  0.2 mg Oral BID  . dexamethasone  10 mg Intravenous Once  . divalproex  500 mg Oral BID  . famotidine (PEPCID) IV  20 mg Intravenous Q12H  . gabapentin  800 mg Oral TID  . insulin aspart  0-15 Units Subcutaneous Q4H  . insulin glargine  20 Units Subcutaneous QHS  . methylPREDNISolone (SOLU-MEDROL) injection  80 mg Intravenous Q12H  . mometasone-formoterol  2 puff Inhalation BID  . pantoprazole  40 mg Oral Daily  . sodium chloride flush  3 mL Intravenous Q12H   Continuous Infusions: . sodium chloride 75 mL/hr at 01/02/16 0803   PRN Meds:.diphenhydrAMINE, naLOXone (NARCAN)  injection, [DISCONTINUED] ondansetron **OR** ondansetron (ZOFRAN) IV  Antibiotics  :    Anti-infectives    None         Objective:   Vitals:   01/02/16 0954 01/02/16 0955 01/02/16 1000 01/02/16 1005  BP: 109/79 109/79 123/66   Pulse:  73 73   Resp:  11 12   Temp:   (!) 96.8 F (36 C) (!) 96.1 F (35.6 C)    TempSrc:   Axillary Oral  SpO2:  100% 100%   Weight:      Height:        Wt Readings from Last 3 Encounters:  01/02/16 (!) 167.9 kg (370 lb 2.4 oz)  10/22/15 (!) 174.7 kg (385 lb 3.2 oz)  10/15/15 (!) 173.2 kg (381 lb 12.8 oz)     Intake/Output Summary (Last 24 hours) at 01/02/16 1104 Last data filed at 01/02/16 0743  Gross per 24 hour  Intake                0 ml  Output              300 ml  Net             -300 ml     Physical Exam  Awake Alert, Oriented X 3, No new F.N deficits, Normal affect .AT,PERRAL, swollen Uvula Supple Neck,No JVD, No cervical lymphadenopathy appriciated.  Symmetrical Chest wall movement, Good air movement bilaterally, CTAB RRR,No Gallops,Rubs or new Murmurs, No Parasternal Heave +ve B.Sounds, Abd Soft, No tenderness, No organomegaly appriciated, No rebound - guarding or rigidity. No Cyanosis, Clubbing or edema, No new Rash or bruise      Data Review:    CBC  Recent Labs Lab 01/01/16 2216 01/01/16 2350  WBC  --  9.5  HGB 14.6 14.0  HCT 43.0 41.5  PLT  --  178  MCV  --  93.3  MCH  --  31.5  MCHC  --  33.7  RDW  --  13.1  LYMPHSABS  --  2.4  MONOABS  --  0.9  EOSABS  --  0.1  BASOSABS  --  0.0    Chemistries   Recent Labs Lab 01/01/16 2211 01/01/16 2216 01/02/16 0748  NA 132* 139 137  K 3.9 4.3 3.6  CL 99* 97* 102  CO2 26  --  29  GLUCOSE 241* 239* 109*  BUN 11 13 8   CREATININE 0.88 0.90 0.69  CALCIUM 8.4*  --  8.6*  AST 46*  --   --   ALT 42  --   --   ALKPHOS 89  --   --   BILITOT 0.4  --   --    ------------------------------------------------------------------------------------------------------------------ No results for input(s): CHOL, HDL, LDLCALC, TRIG, CHOLHDL, LDLDIRECT in the last 72 hours.  Lab Results  Component Value Date   HGBA1C 7.0 10/22/2015   ------------------------------------------------------------------------------------------------------------------  Recent Labs  01/01/16 2350   TSH 2.406   ------------------------------------------------------------------------------------------------------------------ No results for input(s): VITAMINB12, FOLATE, FERRITIN, TIBC, IRON, RETICCTPCT in the last 72 hours.  Coagulation profile No results for input(s): INR, PROTIME in the last 168 hours.  No results for input(s): DDIMER in the last 72 hours.  Cardiac Enzymes No results for input(s): CKMB, TROPONINI, MYOGLOBIN in the last 168 hours.  Invalid input(s): CK ------------------------------------------------------------------------------------------------------------------    Component Value Date/Time   BNP 148.1 (H) 11/19/2015 2045    Micro Results No results found for this or any previous visit (from the past 240 hour(s)).  Radiology Reports Ct Head Wo Contrast  Result Date: 01/01/2016 CLINICAL DATA:  Acute onset of altered mental status. Overdose on Xanax. Patient unresponsive. Insert normal EXAM: CT HEAD WITHOUT CONTRAST TECHNIQUE: Contiguous axial images were obtained from the base of the skull through the vertex without intravenous contrast. COMPARISON:  CT of the head performed 08/21/2015, and MRI of the brain performed 04/08/2015 FINDINGS: Brain: No evidence of acute infarction, hemorrhage, hydrocephalus, extra-axial collection or mass lesion/mass effect. The posterior fossa, including the cerebellum, brainstem and fourth ventricle, is within normal limits. The third and lateral ventricles, and basal ganglia are unremarkable in appearance. The cerebral hemispheres are symmetric in appearance, with normal gray-white differentiation. No mass effect or midline shift is seen. Vascular: No hyperdense vessel or unexpected calcification. Skull: There is no evidence of fracture; visualized osseous structures are unremarkable in appearance. Sinuses/Orbits: The orbits are within normal limits. The paranasal sinuses and mastoid air cells are well-aerated. Other: No significant  soft tissue abnormalities are seen. IMPRESSION: Unremarkable noncontrast CT of the head. Electronically Signed   By: Roanna RaiderJeffery  Chang M.D.   On: 01/01/2016 23:14   Dg Chest Port 1 View  Result Date: 01/01/2016 CLINICAL DATA:  Initial evaluation for acute altered mental status. EXAM: PORTABLE CHEST 1 VIEW COMPARISON:  Prior radiograph from 11/19/2015. FINDINGS: Mild cardiomegaly, stable. Mediastinal silhouette within normal limits. Lungs mildly hypoinflated. Diffuse vascular congestion without overt pulmonary edema. Slightly more confluent patchy bibasilar opacities, which may reflect atelectasis or possibly infiltrates. No pleural effusion. No pneumothorax. No acute osseous abnormality. IMPRESSION: 1. Shallow lung inflation with mild patchy bibasilar opacities, which may reflect atelectasis or infiltrates. Aspiration could be considered as well. 2. Mild cardiomegaly with diffuse pulmonary vascular congestion. No overt pulmonary edema. Electronically Signed   By: Rise MuBenjamin  McClintock M.D.   On: 01/01/2016 22:32    Time Spent in minutes  30   Rehana Uncapher K M.D on 01/02/2016 at 11:04 AM  Between 7am to 7pm - Pager - [phone]  After 7pm  go to [email or web address] - password [facility]  [facility] -  Office  [phone]

## 2016-01-02 NOTE — ED Provider Notes (Signed)
Angiocath insertion Performed by: Marily MemosMesner, Elyse Prevo  Consent: Verbal consent obtained. Risks and benefits: risks, benefits and alternatives were discussed Time out: Immediately prior to procedure a "time out" was called to verify the correct patient, procedure, equipment, support staff and site/side marked as required.  Preparation: Patient was prepped and draped in the usual sterile fashion.  Vein Location: R brachial  Ultrasound Guided  Gauge: 20  Normal blood return and flush without difficulty Patient tolerance: Patient tolerated the procedure well with no immediate complications.      Marily MemosJason Miciah Shealy, MD 01/02/16 [phone]

## 2016-01-03 ENCOUNTER — Encounter (HOSPITAL_COMMUNITY): Payer: Self-pay

## 2016-01-03 ENCOUNTER — Emergency Department (HOSPITAL_COMMUNITY): Payer: Medicare Other

## 2016-01-03 ENCOUNTER — Inpatient Hospital Stay (HOSPITAL_COMMUNITY)
Admission: EM | Admit: 2016-01-03 | Discharge: 2016-01-06 | DRG: 208 | Discharge status: Against Medical Advice | Payer: Medicare Other | Attending: Internal Medicine | Admitting: Internal Medicine

## 2016-01-03 DIAGNOSIS — J9622 Acute and chronic respiratory failure with hypercapnia: Principal | ICD-10-CM

## 2016-01-03 DIAGNOSIS — Z8249 Family history of ischemic heart disease and other diseases of the circulatory system: Secondary | ICD-10-CM

## 2016-01-03 DIAGNOSIS — Z888 Allergy status to other drugs, medicaments and biological substances status: Secondary | ICD-10-CM

## 2016-01-03 DIAGNOSIS — Z79899 Other long term (current) drug therapy: Secondary | ICD-10-CM

## 2016-01-03 DIAGNOSIS — F909 Attention-deficit hyperactivity disorder, unspecified type: Secondary | ICD-10-CM | POA: Diagnosis present

## 2016-01-03 DIAGNOSIS — Z6841 Body Mass Index (BMI) 40.0 and over, adult: Secondary | ICD-10-CM

## 2016-01-03 DIAGNOSIS — G8929 Other chronic pain: Secondary | ICD-10-CM | POA: Diagnosis present

## 2016-01-03 DIAGNOSIS — Z833 Family history of diabetes mellitus: Secondary | ICD-10-CM

## 2016-01-03 DIAGNOSIS — J811 Chronic pulmonary edema: Secondary | ICD-10-CM

## 2016-01-03 DIAGNOSIS — R55 Syncope and collapse: Secondary | ICD-10-CM

## 2016-01-03 DIAGNOSIS — F17210 Nicotine dependence, cigarettes, uncomplicated: Secondary | ICD-10-CM | POA: Diagnosis present

## 2016-01-03 DIAGNOSIS — E662 Morbid (severe) obesity with alveolar hypoventilation: Secondary | ICD-10-CM | POA: Diagnosis present

## 2016-01-03 DIAGNOSIS — M545 Low back pain: Secondary | ICD-10-CM | POA: Diagnosis present

## 2016-01-03 DIAGNOSIS — Z794 Long term (current) use of insulin: Secondary | ICD-10-CM

## 2016-01-03 DIAGNOSIS — J9690 Respiratory failure, unspecified, unspecified whether with hypoxia or hypercapnia: Secondary | ICD-10-CM | POA: Diagnosis present

## 2016-01-03 DIAGNOSIS — G92 Toxic encephalopathy: Secondary | ICD-10-CM | POA: Diagnosis present

## 2016-01-03 DIAGNOSIS — I10 Essential (primary) hypertension: Secondary | ICD-10-CM | POA: Diagnosis present

## 2016-01-03 DIAGNOSIS — E871 Hypo-osmolality and hyponatremia: Secondary | ICD-10-CM | POA: Diagnosis present

## 2016-01-03 DIAGNOSIS — F319 Bipolar disorder, unspecified: Secondary | ICD-10-CM | POA: Diagnosis present

## 2016-01-03 DIAGNOSIS — E785 Hyperlipidemia, unspecified: Secondary | ICD-10-CM | POA: Diagnosis present

## 2016-01-03 DIAGNOSIS — Z8673 Personal history of transient ischemic attack (TIA), and cerebral infarction without residual deficits: Secondary | ICD-10-CM

## 2016-01-03 DIAGNOSIS — E119 Type 2 diabetes mellitus without complications: Secondary | ICD-10-CM | POA: Diagnosis present

## 2016-01-03 DIAGNOSIS — E872 Acidosis: Secondary | ICD-10-CM | POA: Diagnosis present

## 2016-01-03 LAB — I-STAT VENOUS BLOOD GAS, ED
ACID-BASE EXCESS: 5 mmol/L — AB (ref 0.0–2.0)
Bicarbonate: 31.2 mmol/L — ABNORMAL HIGH (ref 20.0–28.0)
O2 SAT: 77 %
PO2 VEN: 42 mmHg (ref 32.0–45.0)
TCO2: 33 mmol/L (ref 0–100)
pCO2, Ven: 49.7 mmHg (ref 44.0–60.0)
pH, Ven: 7.406 (ref 7.250–7.430)

## 2016-01-03 LAB — I-STAT ARTERIAL BLOOD GAS, ED
ACID-BASE EXCESS: 5 mmol/L — AB (ref 0.0–2.0)
ACID-BASE EXCESS: 3 mmol/L — AB (ref 0.0–2.0)
Acid-Base Excess: 4 mmol/L — ABNORMAL HIGH (ref 0.0–2.0)
Acid-Base Excess: 6 mmol/L — ABNORMAL HIGH (ref 0.0–2.0)
BICARBONATE: 31.5 mmol/L — AB (ref 20.0–28.0)
BICARBONATE: 33.9 mmol/L — AB (ref 20.0–28.0)
BICARBONATE: 34.7 mmol/L — AB (ref 20.0–28.0)
BICARBONATE: 29.4 mmol/L — AB (ref 20.0–28.0)
O2 SAT: 100 %
O2 SAT: 99 %
O2 SAT: 97 %
O2 Saturation: 87 %
PH ART: 7.305 — AB (ref 7.350–7.450)
PH ART: 7.321 — AB (ref 7.350–7.450)
PO2 ART: 134 mmHg — AB (ref 83.0–108.0)
PO2 ART: 91 mmHg (ref 83.0–108.0)
Patient temperature: 97.2
Patient temperature: 98.7
TCO2: 33 mmol/L (ref 0–100)
TCO2: 36 mmol/L (ref 0–100)
TCO2: 37 mmol/L (ref 0–100)
TCO2: 31 mmol/L (ref 0–100)
pCO2 arterial: 63.3 mmHg — ABNORMAL HIGH (ref 32.0–48.0)
pCO2 arterial: 65.7 mmHg (ref 32.0–48.0)
pCO2 arterial: 70.8 mmHg (ref 32.0–48.0)
pCO2 arterial: 48.8 mmHg — ABNORMAL HIGH (ref 32.0–48.0)
pH, Arterial: 7.294 — ABNORMAL LOW (ref 7.350–7.450)
pH, Arterial: 7.387 (ref 7.350–7.450)
pO2, Arterial: 60 mmHg — ABNORMAL LOW (ref 83.0–108.0)
pO2, Arterial: 209 mmHg — ABNORMAL HIGH (ref 83.0–108.0)

## 2016-01-03 LAB — BASIC METABOLIC PANEL
ANION GAP: 9 (ref 5–15)
BUN: 6 mg/dL (ref 6–20)
CALCIUM: 8.9 mg/dL (ref 8.9–10.3)
CO2: 28 mmol/L (ref 22–32)
Chloride: 99 mmol/L — ABNORMAL LOW (ref 101–111)
Creatinine, Ser: 0.68 mg/dL (ref 0.61–1.24)
Glucose, Bld: 227 mg/dL — ABNORMAL HIGH (ref 65–99)
Potassium: 3.9 mmol/L (ref 3.5–5.1)
Sodium: 136 mmol/L (ref 135–145)

## 2016-01-03 LAB — URINALYSIS, ROUTINE W REFLEX MICROSCOPIC
Glucose, UA: 250 mg/dL — AB
HGB URINE DIPSTICK: NEGATIVE
Ketones, ur: NEGATIVE mg/dL
Leukocytes, UA: NEGATIVE
NITRITE: NEGATIVE
PH: 6 (ref 5.0–8.0)
Protein, ur: NEGATIVE mg/dL
SPECIFIC GRAVITY, URINE: 1.039 — AB (ref 1.005–1.030)

## 2016-01-03 LAB — PREPARE FRESH FROZEN PLASMA: UNIT DIVISION: 0

## 2016-01-03 LAB — CBC
HEMATOCRIT: 41.8 % (ref 39.0–52.0)
HEMOGLOBIN: 13.8 g/dL (ref 13.0–17.0)
MCH: 31 pg (ref 26.0–34.0)
MCHC: 33 g/dL (ref 30.0–36.0)
MCV: 93.9 fL (ref 78.0–100.0)
Platelets: 188 10*3/uL (ref 150–400)
RBC: 4.45 MIL/uL (ref 4.22–5.81)
RDW: 13.2 % (ref 11.5–15.5)
WBC: 12.9 10*3/uL — ABNORMAL HIGH (ref 4.0–10.5)

## 2016-01-03 LAB — TROPONIN I: Troponin I: <0.03 ng/mL (ref <0.03)

## 2016-01-03 MED ORDER — FAMOTIDINE IN NACL 20-0.9 MG/50ML-% IV SOLN
20.0000 mg | Freq: Once | INTRAVENOUS | Status: AC
  Administered 2016-01-03: 20 mg via INTRAVENOUS
  Filled 2016-01-03: qty 50

## 2016-01-03 MED ORDER — NALOXONE HCL 2 MG/2ML IJ SOSY: 2.0000 mg | PREFILLED_SYRINGE | Freq: Once | INTRAMUSCULAR | Status: DC

## 2016-01-03 MED ORDER — PROPOFOL 1000 MG/100ML IV EMUL
5.0000 ug/kg/min | Freq: Once | INTRAVENOUS | Status: AC
  Administered 2016-01-03: 20 ug/kg/min via INTRAVENOUS
  Administered 2016-01-03: 60 ug/kg/min via INTRAVENOUS
  Administered 2016-01-03: 10 ug/kg/min via INTRAVENOUS
  Administered 2016-01-03: 20 ug/kg/min via INTRAVENOUS
  Filled 2016-01-03: qty 100

## 2016-01-03 MED ORDER — SUCCINYLCHOLINE CHLORIDE 20 MG/ML IJ SOLN
INTRAMUSCULAR | Status: AC | PRN
  Administered 2016-01-03: 150 mg via INTRAVENOUS

## 2016-01-03 MED ORDER — ETOMIDATE 2 MG/ML IV SOLN
INTRAVENOUS | Status: AC | PRN
  Administered 2016-01-03: 20 mg via INTRAVENOUS

## 2016-01-03 MED ORDER — METHYLPREDNISOLONE SODIUM SUCC 125 MG IJ SOLR
125.0000 mg | Freq: Once | INTRAMUSCULAR | Status: AC
  Administered 2016-01-03: 125 mg via INTRAVENOUS
  Filled 2016-01-03: qty 2

## 2016-01-03 NOTE — Discharge Summary (Signed)
AMA  Patient left AMA at 7.40pm on 01-02-16 after the end of my work shift was warned by the RN .   Leroy SeaSINGH,PRASHANT K M.D on 01/03/2016 at 7:23 AM  [facility]    Last Note Below                                                                     PROGRESS NOTE                                                                                                                                                                                                             Patient Demographics:    Su HoffMitchell Hincapie, is a 40 y.o. male, DOB - 04/13/1975, UJW:[id number]  Admit date - 01/01/2016   Admitting Physician Briscoe Deutscherimothy S Opyd, MD  Outpatient Primary MD for the patient is No PCP Per Patient  LOS - 1  Chief Complaint  Patient presents with  . Drug Overdose       Brief Narrative   Bryan Gallegos is a 40 y.o. male with medical history significant for Bipolar disorder, asthma, morbid obesity, OSA, and polysubstance abuse with history of narcotic overdose presenting with EMS after being found unresponsive by his wife. Patient is unable to contribute to the history secondary to his clinical state, and history is therefore obtained through report of patient's wife, EMS, ED personnel, and review of the EMR. The patient had just been home for prison for a couple days and appeared to be in his usual state until this evening when his wife was unable to wake him. She reports spending the day with him and is confident that he did not use any opiate other than a Tylenol #3. He took his prescription Xanax   Subjective:    Su HoffMitchell [place] today has, No headache, No chest pain, No abdominal pain - No Nausea, No new weakness tingling or numbness, No Cough - SOB.    Assessment  &  Plan :      1.Acute encephalopathy most likely due to accidental  polypharmacy. Head CT unremarkable, no focal deficits, he is close to his baseline with almost resolved encephalopathy. Counseled not to overuse his prescription narcotic and benzodiazepine.  2. Morbid obesity, OSA. CPAP bedtime, follow with PCP for weight loss.  3. Swelling of his uvula. This has been a chronic problem and has happened 5 times in the past due to unknown allergy, also required intubation for this in the past, have stopped all unnecessary medications including Lovenox, given him Benadryl IV, Solu-Medrol IV, Zantac IV, discussed his case with pulmonary Dr. Juanetta Gosling, if worse we'll try FFP if all fails and he gets worse clinically than intubation. Currently he is not short of breath, no stridor, he is not working hard for breathing at all. We will monitor.  3. HTN. Continue clonidine.  4. GERD. Continue PPI.  5. DM type II. A1c recently was 7. Continue Lantus and sliding scale. Sugars can be high due to owing steroid use for #3 above.    Family Communication  :  wife  Code Status :  Full  Diet : NPO  Disposition Plan  :  Stepdown  Consults  :  Pulm Dr Juanetta Gosling curbside  Procedures  :    CT Head non acute  DVT Prophylaxis  :   SCDs    Lab Results  Component Value Date   PLT 178 01/01/2016    Inpatient Medications  Scheduled Meds:  Continuous Infusions:  PRN Meds:.  Antibiotics  :    Anti-infectives    None         Objective:   Vitals:   01/02/16 1226 01/02/16 1450 01/02/16 1612 01/02/16 1905  BP:  (!) 103/58  113/68  Pulse:  83  88  Resp:  14  17  Temp: 97.7 F (36.5 C)  97.2 F (36.2 C)   TempSrc: Oral  Oral   SpO2:  100%  94%  Weight:      Height:        Wt Readings from Last 3 Encounters:  01/02/16 (!) 167.9 kg (370 lb 2.4 oz)  10/22/15 (!) 174.7 kg (385 lb 3.2 oz)  10/15/15 (!) 173.2 kg (381 lb 12.8 oz)     Intake/Output Summary (Last 24 hours) at 01/03/16 0723 Last data filed at 01/02/16 1800  Gross per 24 hour  Intake            846.25 ml  Output              300 ml  Net           546.25 ml     Physical Exam  Awake Alert, Oriented X 3, No new F.N deficits, Normal affect [place].AT,PERRAL, swollen Uvula Supple Neck,No JVD, No cervical lymphadenopathy appriciated.  Symmetrical Chest wall movement, Good air movement bilaterally, CTAB RRR,No Gallops,Rubs or new Murmurs, No Parasternal Heave +ve B.Sounds, Abd Soft, No tenderness, No organomegaly appriciated, No rebound - guarding or rigidity. No Cyanosis, Clubbing or edema, No new Rash or bruise      Data Review:    CBC  Recent Labs Lab 01/01/16 2216 01/01/16 2350  WBC  --  9.5  HGB 14.6 14.0  HCT 43.0 41.5  PLT  --  178  MCV  --  93.3  MCH  --  31.5  MCHC  --  33.7  RDW  --  13.1  LYMPHSABS  --  2.4  MONOABS  --  0.9  EOSABS  --  0.1  BASOSABS  --  0.0    Chemistries   Recent Labs Lab 01/01/16 2211 01/01/16 2216 01/02/16 0748  NA 132* 139 137  K 3.9 4.3 3.6  CL 99* 97* 102  CO2 26  --  29  GLUCOSE 241* 239* 109*  BUN 11 13 8   CREATININE 0.88 0.90 0.69  CALCIUM 8.4*  --  8.6*  AST 46*  --   --   ALT 42  --   --   ALKPHOS 89  --   --   BILITOT 0.4  --   --    ------------------------------------------------------------------------------------------------------------------ No results for input(s): CHOL, HDL, LDLCALC, TRIG, CHOLHDL, LDLDIRECT in the last 72 hours.  Lab Results  Component Value Date   HGBA1C 7.0 10/22/2015   ------------------------------------------------------------------------------------------------------------------  Recent Labs  01/01/16 2350  TSH 2.406   ------------------------------------------------------------------------------------------------------------------ No results for input(s): VITAMINB12, FOLATE, FERRITIN, TIBC, IRON, RETICCTPCT in the last 72 hours.  Coagulation profile No results for input(s): INR, PROTIME in the last 168 hours.  No results for input(s): DDIMER in the last 72  hours.  Cardiac Enzymes No results for input(s): CKMB, TROPONINI, MYOGLOBIN in the last 168 hours.  Invalid input(s): CK ------------------------------------------------------------------------------------------------------------------    Component Value Date/Time   BNP 148.1 (H) 11/19/2015 2045    Micro Results Recent Results (from the past 240 hour(s))  MRSA PCR Screening     Status: None   Collection Time: 01/02/16  1:37 AM  Result Value Ref Range Status   MRSA by PCR NEGATIVE NEGATIVE Final    Comment:        The GeneXpert MRSA Assay (FDA approved for NASAL specimens only), is one component of a comprehensive MRSA colonization surveillance program. It is not intended to diagnose MRSA infection nor to guide or monitor treatment for MRSA infections.     Radiology Reports Dg Chest 2 View  Result Date: 01/02/2016 CLINICAL DATA:  Shortness of breath. EXAM: CHEST  2 VIEW COMPARISON:  November 19, 2015 and January 01, 2016 FINDINGS: The heart, hila, mediastinum, lungs, and pleura are unremarkable. No cause for shortness of breath. The rounded density over the lateral left lung is thought to be the inferior tip of the scapula. Recommend attention on follow-up. No acute abnormalities identified. IMPRESSION: No active cardiopulmonary disease. Electronically Signed   By: Gerome Samavid  Williams III M.D   On: 01/02/2016 12:54   Ct Head Wo Contrast  Result Date: 01/01/2016 CLINICAL DATA:  Acute onset of altered mental status. Overdose on Xanax. Patient unresponsive. Insert normal EXAM: CT HEAD WITHOUT CONTRAST TECHNIQUE: Contiguous axial images were obtained from the base of the skull through the vertex without intravenous contrast. COMPARISON:  CT of the head performed 08/21/2015, and MRI of the brain performed 04/08/2015 FINDINGS: Brain: No evidence of acute infarction, hemorrhage, hydrocephalus, extra-axial collection or mass lesion/mass effect. The posterior fossa, including the cerebellum,  brainstem and fourth ventricle, is within normal limits. The third and lateral ventricles, and basal ganglia are unremarkable in appearance. The cerebral hemispheres are symmetric in appearance, with normal gray-white differentiation. No mass effect or midline shift is seen. Vascular: No hyperdense vessel or unexpected calcification. Skull: There is no evidence of fracture; visualized osseous structures are unremarkable in appearance. Sinuses/Orbits: The orbits are within normal limits. The paranasal sinuses and mastoid air cells are well-aerated. Other: No significant soft tissue abnormalities are seen. IMPRESSION: Unremarkable noncontrast CT of the head. Electronically Signed   By: Roanna RaiderJeffery  Chang  M.D.   On: 01/01/2016 23:14   Dg Chest Port 1 View  Result Date: 01/01/2016 CLINICAL DATA:  Initial evaluation for acute altered mental status. EXAM: PORTABLE CHEST 1 VIEW COMPARISON:  Prior radiograph from 11/19/2015. FINDINGS: Mild cardiomegaly, stable. Mediastinal silhouette within normal limits. Lungs mildly hypoinflated. Diffuse vascular congestion without overt pulmonary edema. Slightly more confluent patchy bibasilar opacities, which may reflect atelectasis or possibly infiltrates. No pleural effusion. No pneumothorax. No acute osseous abnormality. IMPRESSION: 1. Shallow lung inflation with mild patchy bibasilar opacities, which may reflect atelectasis or infiltrates. Aspiration could be considered as well. 2. Mild cardiomegaly with diffuse pulmonary vascular congestion. No overt pulmonary edema. Electronically Signed   By: Rise Mu M.D.   On: 01/01/2016 22:32    Time Spent in minutes  30   SINGH,PRASHANT K M.D on 01/03/2016 at 7:23 AM  Between 7am to 7pm - Pager - [phone]  After 7pm go to [email or web address] - password [facility]  [facility] -  Office  [phone]

## 2016-01-03 NOTE — ED Notes (Signed)
Admitting MD at bedside.

## 2016-01-03 NOTE — ED Notes (Signed)
Pt is still alert and communicating with staff. Dr. Preston FleetingGlick informed.

## 2016-01-03 NOTE — ED Provider Notes (Signed)
[facility]-EMERGENCY DEPT Provider Note   CSN: [id number] Arrival date & time: 01/03/16  1538     History   Chief Complaint Chief Complaint  Patient presents with  . Loss of Consciousness    HPI Bryan Gallegos is a 40 y.o. male.  He apparently had a syncopal episode at a gas station and was brought to the ED. He had been admitted to [facility] yesterday because of a swollen uvula, but states that he left because they weren't doing anything for him. He went and bought some Benadryl and states she had taken for Benadryl capsules today. He does endorse some difficulty breathing. He denies fever or chills. He also relates that he has a history of obstructive sleep apnea and he has a machine at home that helps him breathe when he is sleeping. He denies chest pain, heaviness, tightness, pressure. He denies palpitations. There is no nausea or vomiting or cough.   The history is provided by the patient.  Loss of Consciousness      Past Medical History:  Diagnosis Date  . Adult ADHD   . Anaphylactic reaction   . Anxiety   . Asthma   . Bipolar disorder (HCC)   . Complication of anesthesia    Per patient difficult intubation;  . Diabetes mellitus   . Difficult intubation    Per patient  . Heart valve disorder    s/p echocardiogram  . Hyperlipidemia   . Hypertension   . Morbidly obese (HCC)   . OSA (obstructive sleep apnea)   . Transient cerebral ischemia    Unknown    Patient Active Problem List   Diagnosis Date Noted  . Drug overdose   . Acute encephalopathy 01/01/2016  . Hyponatremia 01/01/2016  . Chronic pain syndrome 10/22/2015  . Healthcare maintenance 10/22/2015  . Hoarse voice quality 10/11/2015  . Major depressive disorder, recurrent severe without psychotic features (HCC) 09/04/2015  . Polysubstance dependence including opioid type drug, episodic abuse (HCC) 09/04/2015  . RUQ abdominal pain   . Cholelithiasis 05/17/2015  . Antisocial personality  disorder 04/11/2015  . Opioid use disorder, severe, dependence (HCC) 04/11/2015  . Benzodiazepine dependence (HCC) 04/11/2015  . Tobacco use disorder 04/10/2015  . Narcotic dependency, continuous (HCC) 03/14/2014  . Chronic back pain greater than 3 months duration 01/28/2014  . Gastroesophageal reflux disease with esophagitis 01/28/2014  . Essential hypertension, benign 01/28/2014  . Diabetes mellitus type II, controlled (HCC) 01/28/2014  . Morbid obesity (HCC) 01/28/2014  . TIA (transient ischemic attack) 09/02/2013  . HLD (hyperlipidemia) 08/06/2013  . OSA on CPAP 08/06/2013    Past Surgical History:  Procedure Laterality Date  . CARPAL TUNNEL RELEASE    . ESOPHAGOGASTRODUODENOSCOPY N/A 04/05/2015   Procedure: ESOPHAGOGASTRODUODENOSCOPY (EGD);  Surgeon: Malissa Hippo, MD;  Location: [facility];  Service: Endoscopy;  Laterality: N/A;  . ESOPHAGOGASTRODUODENOSCOPY (EGD) WITH PROPOFOL N/A 05/21/2015   Procedure: ESOPHAGOGASTRODUODENOSCOPY (EGD) WITH PROPOFOL;  Surgeon: Meryl Dare, MD;  Location: [facility] ENDOSCOPY;  Service: Endoscopy;  Laterality: N/A;  . NOSE SURGERY    . TOOTH EXTRACTION    . WISDOM TOOTH EXTRACTION         Home Medications    Prior to Admission medications   Medication Sig Start Date End Date Taking? Authorizing Provider  ACCU-CHEK SOFTCLIX LANCETS lancets Use as instructed for 3 times daily testing of blood sugar 11/10/15   Quentin Angst, MD  acetaminophen-codeine (TYLENOL #3) 300-30 MG tablet TAKE 1 TABLET BY MOUTH  EVERY 4 HOURS AS NEEDED 11/19/15   Tresa Garter, MD  albuterol (PROVENTIL HFA;VENTOLIN HFA) 108 (90 Base) MCG/ACT inhaler Inhale 2 puffs into the lungs every 2 (two) hours as needed for wheezing or shortness of breath. Patient not taking: Reported on 01/01/2016 10/27/15   Arnoldo Morale, MD  albuterol (PROVENTIL HFA;VENTOLIN HFA) 108 (90 Base) MCG/ACT inhaler Inhale 1-2 puffs into the lungs every 6 (six) hours as needed for wheezing or  shortness of breath. 11/20/15   Delos Haring, PA-C  alprazolam Duanne Moron) 2 MG tablet Take 2 mg by mouth 3 (three) times daily as needed for sleep or anxiety.    Historical Provider, MD  amphetamine-dextroamphetamine (ADDERALL) 30 MG tablet Take 60 mg by mouth daily.     Historical Provider, MD  aspirin 325 MG tablet Take 1 tablet (325 mg total) by mouth daily. For heart health 09/05/15   Encarnacion Slates, NP  azithromycin (ZITHROMAX) 250 MG tablet Take 1 tablet (250 mg total) by mouth daily. Take first 2 tablets together, then 1 every day until finished. Patient not taking: Reported on 01/01/2016 11/20/15   Delos Haring, PA-C  blood glucose meter kit and supplies KIT Dispense based on patient and insurance preference. Use up to four times daily as directed. (FOR ICD-9 250.00, 250.01). 10/13/15   Samuella Cota, MD  Blood Glucose Monitoring Suppl (ACCU-CHEK AVIVA PLUS) w/Device KIT Use as directed to check blood sugar 3 times daily. 11/10/15   Tresa Garter, MD  citalopram (CELEXA) 20 MG tablet Take 1 tablet (20 mg total) by mouth every morning. 10/15/15   Elsie Stain, MD  cloNIDine (CATAPRES) 0.1 MG tablet Take 2 tablets (0.2 mg total) by mouth 2 (two) times daily. 10/22/15   Tresa Garter, MD  divalproex (DEPAKOTE) 500 MG DR tablet Take 500 mg by mouth 2 (two) times daily. 10/02/15   Historical Provider, MD  Fluticasone-Salmeterol (ADVAIR) 250-50 MCG/DOSE AEPB Inhale 1 puff into the lungs 2 (two) times daily.    Historical Provider, MD  gabapentin (NEURONTIN) 400 MG capsule Take 2 capsules (800 mg total) by mouth 3 (three) times daily. 10/15/15   Elsie Stain, MD  glucose blood (ACCU-CHEK AVIVA) test strip Use as instructed for 3 times daily testing of blood sugar 11/10/15   Tresa Garter, MD  ibuprofen (ADVIL,MOTRIN) 200 MG tablet Take 400-600 mg by mouth every 6 (six) hours as needed for mild pain or moderate pain.    Historical Provider, MD  insulin glargine (LANTUS) 100 UNIT/ML  injection Inject 0.2 mLs (20 Units total) into the skin at bedtime. 10/15/15   Elsie Stain, MD  Lancet Devices [facility]) lancets Use as instructed for 3 times daily testing of blood sugar 11/10/15   Tresa Garter, MD  metFORMIN (GLUCOPHAGE) 1000 MG tablet Take 1 tablet (1,000 mg total) by mouth 2 (two) times daily. 10/15/15   Elsie Stain, MD  omeprazole (PRILOSEC) 40 MG capsule Take 1 capsule (40 mg total) by mouth 2 (two) times daily. 10/15/15   Elsie Stain, MD  ranitidine (ZANTAC) 300 MG tablet Take 1 tablet (300 mg total) by mouth at bedtime. 10/15/15   Elsie Stain, MD  traZODone (DESYREL) 50 MG tablet Take 1 tablet (50 mg total) by mouth at bedtime. For sleep 10/15/15   Elsie Stain, MD    Family History Family History  Problem Relation Age of Onset  . CAD Mother     Living  . Diabetes  Mellitus II Mother   . Stroke Mother   . Hypertension Mother   . Congestive Heart Failure Mother   . Kidney disease Mother   . Fibromyalgia Mother   . Thyroid disease Mother   . Hyperlipidemia Mother   . Liver disease Mother   . Alcoholism Father 17    Deceased  . Arthritis Maternal Grandmother   . Congestive Heart Failure Maternal Grandmother   . Hypertension Maternal Grandmother   . Lung cancer Maternal Grandfather   . Colon cancer Maternal Aunt   . Stomach cancer Maternal Aunt   . Heart disease Other     Paternal & Maternal  . Crohn's disease Other   . Hypertension Other     Paternal & Maternal  . Hypertension Brother     x3  . Hypertension Sister     #1  . Bipolar disorder Sister     #1  . ADD / ADHD Son     x3  . Bipolar disorder Son     x3  . Asperger's syndrome Son     Social History Social History  Substance Use Topics  . Smoking status: Current Every Day Smoker    Packs/day: 0.50    Years: 23.00    Types: Cigarettes  . Smokeless tobacco: Never Used  . Alcohol use No     Allergies   Atenolol; Lisinopril; Prednisone; and Tylenol  [acetaminophen]   Review of Systems Review of Systems  Cardiovascular: Positive for syncope.  All other systems reviewed and are negative.    Physical Exam Updated Vital Signs BP (!) 97/50   Pulse 61   Resp 20   Ht '6\' 2"'$  (1.88 m)   SpO2 97%   Physical Exam  Nursing note and vitals reviewed.  Morbidly obese 40 year old male, resting comfortably and in no acute distress. Vital signs are normal. Oxygen saturation is 97%, which is normal. He is intermittently falling asleep, but is easily awakened. Have some periods of apnea with oxygen saturations dropping to 90-91%. Head is normocephalic and atraumatic. PERRLA, EOMI. Oropharynx shows mildly to moderately swollen uvula. There is no pooling of secretions and no stridor. Voice is somewhat hoarse. Neck is nontender and supple without adenopathy or JVD. Back is nontender and there is no CVA tenderness. Lungs are clear without rales, wheezes, or rhonchi. Chest is nontender. Heart has regular rate and rhythm without murmur. Abdomen is soft, flat, nontender without masses or hepatosplenomegaly and peristalsis is normoactive. Extremities have 1+ edema, full range of motion is present. Moderate venous stasis changes are present. Skin is warm and dry without rash. Neurologic: Mental status is normal, cranial nerves are intact, there are no motor or sensory deficits.  ED Treatments / Results  Labs (all labs ordered are listed, but only abnormal results are displayed) Labs Reviewed  BASIC METABOLIC PANEL - Abnormal; Notable for the following:       Result Value   Chloride 99 (*)    Glucose, Bld 227 (*)    All other components within normal limits  CBC - Abnormal; Notable for the following:    WBC 12.9 (*)    All other components within normal limits  URINALYSIS, ROUTINE W REFLEX MICROSCOPIC (NOT AT [facility]) - Abnormal; Notable for the following:    Color, Urine AMBER (*)    APPearance CLOUDY (*)    Specific Gravity, Urine 1.039 (*)     Glucose, UA 250 (*)    Bilirubin Urine SMALL (*)  All other components within normal limits  I-STAT ARTERIAL BLOOD GAS, ED - Abnormal; Notable for the following:    pH, Arterial 7.305 (*)    pCO2 arterial 63.3 (*)    pO2, Arterial 60.0 (*)    Bicarbonate 31.5 (*)    Acid-Base Excess 4.0 (*)    All other components within normal limits  I-STAT VENOUS BLOOD GAS, ED - Abnormal; Notable for the following:    Bicarbonate 31.2 (*)    Acid-Base Excess 5.0 (*)    All other components within normal limits  I-STAT ARTERIAL BLOOD GAS, ED - Abnormal; Notable for the following:    pH, Arterial 7.321 (*)    pCO2 arterial 65.7 (*)    pO2, Arterial 209.0 (*)    Bicarbonate 33.9 (*)    Acid-Base Excess 6.0 (*)    All other components within normal limits  I-STAT ARTERIAL BLOOD GAS, ED - Abnormal; Notable for the following:    pH, Arterial 7.294 (*)    pCO2 arterial 70.8 (*)    pO2, Arterial 134.0 (*)    Bicarbonate 34.7 (*)    Acid-Base Excess 5.0 (*)    All other components within normal limits  I-STAT ARTERIAL BLOOD GAS, ED - Abnormal; Notable for the following:    pCO2 arterial 48.8 (*)    Bicarbonate 29.4 (*)    Acid-Base Excess 3.0 (*)    All other components within normal limits  TROPONIN I  BLOOD GAS, ARTERIAL  CBG MONITORING, ED    EKG  EKG Interpretation  Date/Time:  Saturday January 03 2016 15:40:23 EDT Ventricular Rate:  91 PR Interval:    QRS Duration: 89 QT Interval:  352 QTC Calculation: 433 R Axis:   84 Text Interpretation:  Sinus rhythm Normal ECG When compared with ECG of 01/01/2016, No significant change was found Confirmed by [facility]  MD, Tyson Parkison ([id number]) on 01/03/2016 4:04:06 PM       Radiology Dg Chest 2 View  Result Date: 01/02/2016 CLINICAL DATA:  Shortness of breath. EXAM: CHEST  2 VIEW COMPARISON:  November 19, 2015 and January 01, 2016 FINDINGS: The heart, hila, mediastinum, lungs, and pleura are unremarkable. No cause for shortness of breath. The rounded  density over the lateral left lung is thought to be the inferior tip of the scapula. Recommend attention on follow-up. No acute abnormalities identified. IMPRESSION: No active cardiopulmonary disease. Electronically Signed   By: Dorise Bullion III M.D   On: 01/02/2016 12:54   Ct Head Wo Contrast  Result Date: 01/01/2016 CLINICAL DATA:  Acute onset of altered mental status. Overdose on Xanax. Patient unresponsive. Insert normal EXAM: CT HEAD WITHOUT CONTRAST TECHNIQUE: Contiguous axial images were obtained from the base of the skull through the vertex without intravenous contrast. COMPARISON:  CT of the head performed 08/21/2015, and MRI of the brain performed 04/08/2015 FINDINGS: Brain: No evidence of acute infarction, hemorrhage, hydrocephalus, extra-axial collection or mass lesion/mass effect. The posterior fossa, including the cerebellum, brainstem and fourth ventricle, is within normal limits. The third and lateral ventricles, and basal ganglia are unremarkable in appearance. The cerebral hemispheres are symmetric in appearance, with normal gray-white differentiation. No mass effect or midline shift is seen. Vascular: No hyperdense vessel or unexpected calcification. Skull: There is no evidence of fracture; visualized osseous structures are unremarkable in appearance. Sinuses/Orbits: The orbits are within normal limits. The paranasal sinuses and mastoid air cells are well-aerated. Other: No significant soft tissue abnormalities are seen. IMPRESSION: Unremarkable noncontrast CT of the head.  Electronically Signed   By: Garald Balding M.D.   On: 01/01/2016 23:14   Dg Chest Port 1 View  Result Date: 01/03/2016 CLINICAL DATA:  Acute respiratory failure. Polysubstance abuse. Intubation. EXAM: PORTABLE CHEST 1 VIEW COMPARISON:  Prior today FINDINGS: Endotracheal tube tip is approximately 5 cm above the carina. Nasogastric tube is seen entering the stomach. Increased patchy airspace opacity is seen in the left  lung base. Mild atelectasis or infiltrate also seen in the right lung base, without significant change. Heart size is within normal limits allowing for low lung volumes. IMPRESSION: Endotracheal tube and nasogastric tube in appropriate position. Increased patchy left lower lobe airspace disease. No significant change in mild right basilar atelectasis versus infiltrate. Electronically Signed   By: Earle Gell M.D.   On: 01/03/2016 21:10   Dg Chest Portable 1 View  Result Date: 01/03/2016 CLINICAL DATA:  40 year old male with a history of left-sided chest pain EXAM: PORTABLE CHEST 1 VIEW COMPARISON:  01/02/2016, 01/01/2016 FINDINGS: Cardiomediastinal silhouette unchanged in size and contour. No evidence of central vascular congestion. Low lung volumes accentuates the interstitium with no confluent airspace disease. No pneumothorax or pleural effusion. IMPRESSION: Low lung volumes with no evidence of acute cardiopulmonary disease. Signed, Dulcy Fanny. Earleen Newport, DO [facility] Electronically Signed   By: Corrie Mckusick D.O.   On: 01/03/2016 18:52   Dg Chest Port 1 View  Result Date: 01/01/2016 CLINICAL DATA:  Initial evaluation for acute altered mental status. EXAM: PORTABLE CHEST 1 VIEW COMPARISON:  Prior radiograph from 11/19/2015. FINDINGS: Mild cardiomegaly, stable. Mediastinal silhouette within normal limits. Lungs mildly hypoinflated. Diffuse vascular congestion without overt pulmonary edema. Slightly more confluent patchy bibasilar opacities, which may reflect atelectasis or possibly infiltrates. No pleural effusion. No pneumothorax. No acute osseous abnormality. IMPRESSION: 1. Shallow lung inflation with mild patchy bibasilar opacities, which may reflect atelectasis or infiltrates. Aspiration could be considered as well. 2. Mild cardiomegaly with diffuse pulmonary vascular congestion. No overt pulmonary edema. Electronically Signed   By: Jeannine Boga M.D.   On: 01/01/2016 22:32    Procedures Procedures (including critical care time) Intraosseous line placement Indication: Need for vascular access for medication delivery Consent not obtained because this is an emergent procedure Right proximal tibia was identified and area cleansed with chlorhexidine Intraosseous line inserted without difficulty. Patient tolerated procedure well. Blood was able to be aspirated and lab was able to be flushed adequately.  INTUBATION Performed by: NOIBB,CWUGQ  Required items: required blood products, implants, devices, and special equipment available Patient identity confirmed: provided demographic data and hospital-assigned identification number Time out: Immediately prior to procedure a "time out" was called to verify the correct patient, procedure, equipment, support staff and site/side marked as required.  Indications: Hypercarbic respiratory failure   Intubation method: Glidescope Laryngoscopy   Preoxygenation: BVM  Sedatives: Etomidate Paralytic: Succinylcholine  Tube Size: 7.5 cuffed  Post-procedure assessment: chest rise and ETCO2 monitor Breath sounds: equal and absent over the epigastrium Tube secured with: ETT holder Chest x-ray interpreted by radiologist and me.  Chest x-ray findings: endotracheal tube in appropriate position  Patient tolerated the procedure well with no immediate complications.   CRITICAL CARE Performed by: Delora Fuel Total critical care time: 200 minutes Critical care time was exclusive of separately billable procedures and treating other patients. Critical care was necessary to treat or prevent imminent or life-threatening deterioration. Critical care was time spent personally by me on the following activities: development of treatment plan with  patient and/or surrogate as well as nursing, discussions with consultants, evaluation of patient's response to treatment, examination of patient,  obtaining history from patient or surrogate, ordering and performing treatments and interventions, ordering and review of laboratory studies, ordering and review of radiographic studies, pulse oximetry and re-evaluation of patient's condition.   Medications Ordered in ED Medications  naloxone [facility]) injection 2 mg (2 mg Intravenous Not Given 01/03/16 1832)  methylPREDNISolone sodium succinate (SOLU-MEDROL) 125 mg/2 mL injection 125 mg (125 mg Intravenous Given 01/03/16 1831)  famotidine (PEPCID) IVPB 20 mg premix (0 mg Intravenous Stopped 01/03/16 1901)  etomidate (AMIDATE) injection (20 mg Intravenous Given 01/03/16 2035)  succinylcholine (ANECTINE) injection (150 mg Intravenous Given 01/03/16 2035)  propofol (DIPRIVAN) 1000 MG/100ML infusion (30 mcg/kg/min  167.9 kg Intravenous Rate/Dose Change 01/03/16 2132)     Initial Impression / Assessment and Plan / ED Course  I have reviewed the triage vital signs and the nursing notes.  Pertinent labs & imaging results that were available during my care of the patient were reviewed by me and considered in my medical decision making (see chart for details).  Clinical Course  Value Comment By Time  CT HEAD WO CONTRAST (Reviewed) Delora Fuel, MD 94/58 [id number]   Syncopal episode. Old records are reviewed, and he actually had been admitted to the hospital 2 days ago with altered mental status, and left yesterday evening [place]. It was noted that he did have swollen uvula and has had some more problem in the past and was treated with steroids, antihistamines, and fresh frozen plasma. Syncopal episode today could be related to excess amount of antihistamine. Uvula does not appear to be swollen sufficient to cause actual respiratory difficulty. We'll check our till blood gas to make sure he is not becoming hypercarbic. He is given additional steroids here.  1710 ABG shows evidence of acute respiratory failure with PCO2 of 63 and pH is 7.30. He  is placed on BiPAP. Will repeat ABG after 30 minutes on BiPAP. Because of his uvula swelling and morbid obesity, I am concerned about difficult intubation.  1845 ABG shows increase in PCO2 and drop in pH in spite of being on BiPAP. It is felt that he needs to be intubated. Because of concerns about difficult intubation and possible difficult surgical airway, Dr. Constance Holster of ENT was consulted who has come to the ED, and Dr. Ola Spurr was consulted from anesthesia service was come to the ED.  IV which was in place was found to be inoperative and no obvious peripheral IV sites were available. I placed a right tibial intraosseous line for rapid sequence induction and maintenance of sedation.  Patient was intubated without difficulty. Postprocedure x-ray shows adequate ET tube placement and he has continued to maintain adequate oxygen saturations. Unfortunately, the intraosseous line became inoperative. Peripheral IV was started to maintain propofol-induced sedation. Case is discussed with Dr. Oletta Darter of critical care service who agrees to admit the patient.  Final Clinical Impressions(s) / ED Diagnoses   Final diagnoses:  Acute and chronic respiratory failure with hypercapnia ([place])  Syncope, unspecified syncope type    New Prescriptions New Prescriptions   No medications on file     Delora Fuel, MD 24/46/28 [id number]

## 2016-01-03 NOTE — Procedures (Signed)
Intubation Procedure Note Hennie DuosMitchell L Wiederhold [id number] 09/18/1975  Procedure: Intubation Indications: Airway protection and maintenance  Procedure Details Consent: Risks of procedure as well as the alternatives and risks of each were explained to the (patient/caregiver).  Consent for procedure obtained. Time Out: Verified patient identification, verified procedure, site/side was marked, verified correct patient position, special equipment/implants available, medications/allergies/relevent history reviewed, required imaging and test results available.  Performed  Maximum sterile technique was used including gloves.  [place]    Evaluation Hemodynamic Status: BP stable throughout; O2 sats: stable throughout Patient's Current Condition: stable Complications: No apparent complications Patient did tolerate procedure well. Chest X-ray ordered to verify placement.  CXR: pending.   Rushie ChestnutReid, [facility] 01/03/2016

## 2016-01-03 NOTE — ED Triage Notes (Signed)
To room via EMS.   Pt was at gas station standing at pump and had possible syncopal episode.   C/o pain at back of head and back pain from fall.  Pts speech is slurred.  Reports he took Benadryl tablets x 6 today for swollen uvula.  No respiratory or swallowing difficulties.  Pt returns to sleep quickly with snoring respirations.

## 2016-01-03 NOTE — Consult Note (Signed)
Reason for Consult:Potential difficult intubation, morbid obesity, obstructive sleep apnea, hypercarbia, respiratory acidosis. Referring Physician: Delora Fuel, MD  Bryan Gallegos is an 40 y.o. male.  HPI: admitted to the ED with mental status changes, on BiPAP, CO2 increasing, becoimiong acidotic.   Past Medical History:  Diagnosis Date  . Adult ADHD   . Anaphylactic reaction   . Anxiety   . Asthma   . Bipolar disorder ([place])   . Complication of anesthesia    Per patient difficult intubation;  . Diabetes mellitus   . Difficult intubation    Per patient  . Heart valve disorder    s/p echocardiogram  . Hyperlipidemia   . Hypertension   . Morbidly obese ([place])   . OSA (obstructive sleep apnea)   . Transient cerebral ischemia    Unknown    Past Surgical History:  Procedure Laterality Date  . CARPAL TUNNEL RELEASE    . ESOPHAGOGASTRODUODENOSCOPY N/A 04/05/2015   Procedure: ESOPHAGOGASTRODUODENOSCOPY (EGD);  Surgeon: Rogene Houston, MD;  [facility];  Service: Endoscopy;  Laterality: N/A;  . ESOPHAGOGASTRODUODENOSCOPY (EGD) WITH PROPOFOL N/A 05/21/2015   Procedure: ESOPHAGOGASTRODUODENOSCOPY (EGD) WITH PROPOFOL;  Surgeon: Ladene Artist, MD;  Location: [facility];  Service: Endoscopy;  Laterality: N/A;  . NOSE SURGERY    . TOOTH EXTRACTION    . WISDOM TOOTH EXTRACTION      Family History  Problem Relation Age of Onset  . CAD Mother     Living  . Diabetes Mellitus II Mother   . Stroke Mother   . Hypertension Mother   . Congestive Heart Failure Mother   . Kidney disease Mother   . Fibromyalgia Mother   . Thyroid disease Mother   . Hyperlipidemia Mother   . Liver disease Mother   . Alcoholism Father 109    Deceased  . Arthritis Maternal Grandmother   . Congestive Heart Failure Maternal Grandmother   . Hypertension Maternal Grandmother   . Lung cancer Maternal Grandfather   . Colon cancer Maternal Aunt   . Stomach cancer Maternal Aunt   . Heart  disease Other     Paternal & Maternal  . Crohn's disease Other   . Hypertension Other     Paternal & Maternal  . Hypertension Brother     x3  . Hypertension Sister     #1  . Bipolar disorder Sister     #1  . ADD / ADHD Son     x3  . Bipolar disorder Son     x3  . Asperger's syndrome Son     Social History:  reports that he has been smoking Cigarettes.  He has a 11.50 pack-year smoking history. He has never used smokeless tobacco. He reports that he does not drink alcohol or use drugs.  Allergies:  Allergies  Allergen Reactions  . Atenolol Anaphylaxis  . Lisinopril Anaphylaxis    NO ACE INHIBITORS!!!  . Prednisone Hives  . Ibuprofen Itching and Swelling    Hands and feet swelling  . Tylenol [Acetaminophen] Other (See Comments)    GI Bleed    Medications: Reviewed  Results for orders placed or performed during the hospital encounter of 01/03/16 (from the past 48 hour(s))  I-Stat arterial blood gas, ED     Status: Abnormal   Collection Time: 01/03/16  4:59 PM  Result Value Ref Range   pH, Arterial 7.305 (L) 7.350 - 7.450   pCO2 arterial 63.3 (H) 32.0 - 48.0 mmHg   pO2,  Arterial 60.0 (L) 83.0 - 108.0 mmHg   Bicarbonate 31.5 (H) 20.0 - 28.0 mmol/L   TCO2 33 0 - 100 mmol/L   O2 Saturation 87.0 %   Acid-Base Excess 4.0 (H) 0.0 - 2.0 mmol/L   Patient temperature HIDE    Sample type ARTERIAL   Basic metabolic panel     Status: Abnormal   Collection Time: 01/03/16  5:57 PM  Result Value Ref Range   Sodium 136 135 - 145 mmol/L   Potassium 3.9 3.5 - 5.1 mmol/L   Chloride 99 (L) 101 - 111 mmol/L   CO2 28 22 - 32 mmol/L   Glucose, Bld 227 (H) 65 - 99 mg/dL   BUN 6 6 - 20 mg/dL   Creatinine, Ser 0.68 0.61 - 1.24 mg/dL   Calcium 8.9 8.9 - 10.3 mg/dL   GFR calc non Af Amer >60 >60 mL/min   GFR calc Af Amer >60 >60 mL/min    Comment: (NOTE) The eGFR has been calculated using the CKD EPI equation. This calculation has not been validated in all clinical situations. eGFR's  persistently <60 mL/min signify possible Chronic Kidney Disease.    Anion gap 9 5 - 15  CBC     Status: Abnormal   Collection Time: 01/03/16  5:57 PM  Result Value Ref Range   WBC 12.9 (H) 4.0 - 10.5 K/uL   RBC 4.45 4.22 - 5.81 MIL/uL   Hemoglobin 13.8 13.0 - 17.0 g/dL   HCT 41.8 39.0 - 52.0 %   MCV 93.9 78.0 - 100.0 fL   MCH 31.0 26.0 - 34.0 pg   MCHC 33.0 30.0 - 36.0 g/dL   RDW 13.2 11.5 - 15.5 %   Platelets 188 150 - 400 K/uL  Troponin I     Status: None   Collection Time: 01/03/16  5:57 PM  Result Value Ref Range   Troponin I <0.03 <0.03 ng/mL  I-Stat venous blood gas, ED     Status: Abnormal   Collection Time: 01/03/16  6:02 PM  Result Value Ref Range   pH, Ven 7.406 7.250 - 7.430   pCO2, Ven 49.7 44.0 - 60.0 mmHg   pO2, Ven 42.0 32.0 - 45.0 mmHg   Bicarbonate 31.2 (H) 20.0 - 28.0 mmol/L   TCO2 33 0 - 100 mmol/L   O2 Saturation 77.0 %   Acid-Base Excess 5.0 (H) 0.0 - 2.0 mmol/L   Patient temperature HIDE    Sample type VENOUS   I-Stat arterial blood gas, ED     Status: Abnormal   Collection Time: 01/03/16  6:24 PM  Result Value Ref Range   pH, Arterial 7.321 (L) 7.350 - 7.450   pCO2 arterial 65.7 (HH) 32.0 - 48.0 mmHg   pO2, Arterial 209.0 (H) 83.0 - 108.0 mmHg   Bicarbonate 33.9 (H) 20.0 - 28.0 mmol/L   TCO2 36 0 - 100 mmol/L   O2 Saturation 100.0 %   Acid-Base Excess 6.0 (H) 0.0 - 2.0 mmol/L   Patient temperature HIDE    Sample type ARTERIAL    Comment NOTIFIED PHYSICIAN   Urinalysis, Routine w reflex microscopic     Status: Abnormal   Collection Time: 01/03/16  6:57 PM  Result Value Ref Range   Color, Urine AMBER (A) YELLOW    Comment: BIOCHEMICALS MAY BE AFFECTED BY COLOR   APPearance CLOUDY (A) CLEAR   Specific Gravity, Urine 1.039 (H) 1.005 - 1.030   pH 6.0 5.0 - 8.0   Glucose, UA 250 (  A) NEGATIVE mg/dL   Hgb urine dipstick NEGATIVE NEGATIVE   Bilirubin Urine SMALL (A) NEGATIVE   Ketones, ur NEGATIVE NEGATIVE mg/dL   Protein, ur NEGATIVE NEGATIVE  mg/dL   Nitrite NEGATIVE NEGATIVE   Leukocytes, UA NEGATIVE NEGATIVE    Comment: MICROSCOPIC NOT DONE ON URINES WITH NEGATIVE PROTEIN, BLOOD, LEUKOCYTES, NITRITE, OR GLUCOSE <1000 mg/dL.  I-Stat arterial blood gas, ED     Status: Abnormal   Collection Time: 01/03/16  7:48 PM  Result Value Ref Range   pH, Arterial 7.294 (L) 7.350 - 7.450   pCO2 arterial 70.8 (HH) 32.0 - 48.0 mmHg   pO2, Arterial 134.0 (H) 83.0 - 108.0 mmHg   Bicarbonate 34.7 (H) 20.0 - 28.0 mmol/L   TCO2 37 0 - 100 mmol/L   O2 Saturation 99.0 %   Acid-Base Excess 5.0 (H) 0.0 - 2.0 mmol/L   Patient temperature 97.2 F    Collection site BRACHIAL ARTERY    Drawn by RT    Sample type ARTERIAL    Comment NOTIFIED PHYSICIAN     Dg Chest 2 View  Result Date: 01/02/2016 CLINICAL DATA:  Shortness of breath. EXAM: CHEST  2 VIEW COMPARISON:  November 19, 2015 and January 01, 2016 FINDINGS: The heart, hila, mediastinum, lungs, and pleura are unremarkable. No cause for shortness of breath. The rounded density over the lateral left lung is thought to be the inferior tip of the scapula. Recommend attention on follow-up. No acute abnormalities identified. IMPRESSION: No active cardiopulmonary disease. Electronically Signed   By: Dorise Bullion III M.D   On: 01/02/2016 12:54   Ct Head Wo Contrast  Result Date: 01/01/2016 CLINICAL DATA:  Acute onset of altered mental status. Overdose on Xanax. Patient unresponsive. Insert normal EXAM: CT HEAD WITHOUT CONTRAST TECHNIQUE: Contiguous axial images were obtained from the base of the skull through the vertex without intravenous contrast. COMPARISON:  CT of the head performed 08/21/2015, and MRI of the brain performed 04/08/2015 FINDINGS: Brain: No evidence of acute infarction, hemorrhage, hydrocephalus, extra-axial collection or mass lesion/mass effect. The posterior fossa, including the cerebellum, brainstem and fourth ventricle, is within normal limits. The third and lateral ventricles, and  basal ganglia are unremarkable in appearance. The cerebral hemispheres are symmetric in appearance, with normal gray-white differentiation. No mass effect or midline shift is seen. Vascular: No hyperdense vessel or unexpected calcification. Skull: There is no evidence of fracture; visualized osseous structures are unremarkable in appearance. Sinuses/Orbits: The orbits are within normal limits. The paranasal sinuses and mastoid air cells are well-aerated. Other: No significant soft tissue abnormalities are seen. IMPRESSION: Unremarkable noncontrast CT of the head. Electronically Signed   By: Garald Balding M.D.   On: 01/01/2016 23:14   Dg Chest Portable 1 View  Result Date: 01/03/2016 CLINICAL DATA:  40 year old male with a history of left-sided chest pain EXAM: PORTABLE CHEST 1 VIEW COMPARISON:  01/02/2016, 01/01/2016 FINDINGS: Cardiomediastinal silhouette unchanged in size and contour. No evidence of central vascular congestion. Low lung volumes accentuates the interstitium with no confluent airspace disease. No pneumothorax or pleural effusion. IMPRESSION: Low lung volumes with no evidence of acute cardiopulmonary disease. Signed, Dulcy Fanny. Earleen Newport, DO [facility] Electronically Signed   By: Corrie Mckusick D.O.   On: 01/03/2016 18:52   Dg Chest Port 1 View  Result Date: 01/01/2016 CLINICAL DATA:  Initial evaluation for acute altered mental status. EXAM: PORTABLE CHEST 1 VIEW COMPARISON:  Prior radiograph from 11/19/2015. FINDINGS: Mild cardiomegaly, stable.  Mediastinal silhouette within normal limits. Lungs mildly hypoinflated. Diffuse vascular congestion without overt pulmonary edema. Slightly more confluent patchy bibasilar opacities, which may reflect atelectasis or possibly infiltrates. No pleural effusion. No pneumothorax. No acute osseous abnormality. IMPRESSION: 1. Shallow lung inflation with mild patchy bibasilar opacities, which may reflect  atelectasis or infiltrates. Aspiration could be considered as well. 2. Mild cardiomegaly with diffuse pulmonary vascular congestion. No overt pulmonary edema. Electronically Signed   By: Jeannine Boga M.D.   On: 01/01/2016 22:32    REU:[id number] except as listed in admit H&P  Blood pressure 144/96, pulse 91, resp. rate 15, SpO2 100 %.  PHYSICAL EXAM: Overall appearance:  Morbidly obese, on BiPAP, sleepy but arousible. Head:  Normocephalic, atraumatic. Ears: External ears look normal. Nose: External nose is healthy in appearance. Internal nasal exam free of any lesions or obstruction. Oral Cavity/Pharynx:  There are no mucosal lesions or masses identified. Larynx/Hypopharynx: Deferred Neuro:  No identifiable neurologic deficits. Neck: No palpable neck masses.  Studies Reviewed: none  Procedures: none   Assessment/Plan: Intubation performed by the ED staff, surgical intervention not needed.  Aniketh Huberty 01/03/2016, 8:38 PM

## 2016-01-03 NOTE — ED Notes (Signed)
Phlebotomy at bedside.

## 2016-01-03 NOTE — ED Triage Notes (Signed)
Pt c/o left sided chest wall pain, worse when pressed.  EMS gave ASA 324 mg.

## 2016-01-04 DIAGNOSIS — E872 Acidosis: Secondary | ICD-10-CM | POA: Diagnosis present

## 2016-01-04 DIAGNOSIS — E871 Hypo-osmolality and hyponatremia: Secondary | ICD-10-CM | POA: Diagnosis present

## 2016-01-04 DIAGNOSIS — E662 Morbid (severe) obesity with alveolar hypoventilation: Secondary | ICD-10-CM | POA: Diagnosis present

## 2016-01-04 DIAGNOSIS — R4182 Altered mental status, unspecified: Secondary | ICD-10-CM

## 2016-01-04 DIAGNOSIS — Z794 Long term (current) use of insulin: Secondary | ICD-10-CM | POA: Diagnosis not present

## 2016-01-04 DIAGNOSIS — F17210 Nicotine dependence, cigarettes, uncomplicated: Secondary | ICD-10-CM | POA: Diagnosis present

## 2016-01-04 DIAGNOSIS — Z6841 Body Mass Index (BMI) 40.0 and over, adult: Secondary | ICD-10-CM | POA: Diagnosis not present

## 2016-01-04 DIAGNOSIS — Z79899 Other long term (current) drug therapy: Secondary | ICD-10-CM | POA: Diagnosis not present

## 2016-01-04 DIAGNOSIS — E785 Hyperlipidemia, unspecified: Secondary | ICD-10-CM | POA: Diagnosis present

## 2016-01-04 DIAGNOSIS — Z888 Allergy status to other drugs, medicaments and biological substances status: Secondary | ICD-10-CM | POA: Diagnosis not present

## 2016-01-04 DIAGNOSIS — Z833 Family history of diabetes mellitus: Secondary | ICD-10-CM | POA: Diagnosis not present

## 2016-01-04 DIAGNOSIS — E119 Type 2 diabetes mellitus without complications: Secondary | ICD-10-CM | POA: Diagnosis present

## 2016-01-04 DIAGNOSIS — R55 Syncope and collapse: Secondary | ICD-10-CM | POA: Diagnosis present

## 2016-01-04 DIAGNOSIS — J9621 Acute and chronic respiratory failure with hypoxia: Secondary | ICD-10-CM | POA: Diagnosis not present

## 2016-01-04 DIAGNOSIS — J9602 Acute respiratory failure with hypercapnia: Secondary | ICD-10-CM

## 2016-01-04 DIAGNOSIS — I10 Essential (primary) hypertension: Secondary | ICD-10-CM | POA: Diagnosis present

## 2016-01-04 DIAGNOSIS — J811 Chronic pulmonary edema: Secondary | ICD-10-CM | POA: Diagnosis present

## 2016-01-04 DIAGNOSIS — G9340 Encephalopathy, unspecified: Secondary | ICD-10-CM | POA: Diagnosis not present

## 2016-01-04 DIAGNOSIS — Z8249 Family history of ischemic heart disease and other diseases of the circulatory system: Secondary | ICD-10-CM | POA: Diagnosis not present

## 2016-01-04 DIAGNOSIS — G92 Toxic encephalopathy: Secondary | ICD-10-CM | POA: Diagnosis present

## 2016-01-04 DIAGNOSIS — G8929 Other chronic pain: Secondary | ICD-10-CM | POA: Diagnosis present

## 2016-01-04 DIAGNOSIS — R0600 Dyspnea, unspecified: Secondary | ICD-10-CM | POA: Diagnosis not present

## 2016-01-04 DIAGNOSIS — J9690 Respiratory failure, unspecified, unspecified whether with hypoxia or hypercapnia: Secondary | ICD-10-CM | POA: Diagnosis present

## 2016-01-04 DIAGNOSIS — M545 Low back pain: Secondary | ICD-10-CM | POA: Diagnosis present

## 2016-01-04 DIAGNOSIS — F319 Bipolar disorder, unspecified: Secondary | ICD-10-CM | POA: Diagnosis present

## 2016-01-04 DIAGNOSIS — F909 Attention-deficit hyperactivity disorder, unspecified type: Secondary | ICD-10-CM | POA: Diagnosis present

## 2016-01-04 DIAGNOSIS — J9622 Acute and chronic respiratory failure with hypercapnia: Secondary | ICD-10-CM | POA: Diagnosis present

## 2016-01-04 DIAGNOSIS — Z8673 Personal history of transient ischemic attack (TIA), and cerebral infarction without residual deficits: Secondary | ICD-10-CM | POA: Diagnosis not present

## 2016-01-04 DIAGNOSIS — J810 Acute pulmonary edema: Secondary | ICD-10-CM | POA: Diagnosis not present

## 2016-01-04 LAB — GLUCOSE, CAPILLARY
GLUCOSE-CAPILLARY: 152 mg/dL — AB (ref 65–99)
GLUCOSE-CAPILLARY: 143 mg/dL — AB (ref 65–99)
GLUCOSE-CAPILLARY: 143 mg/dL — AB (ref 65–99)
Glucose-Capillary: 134 mg/dL — ABNORMAL HIGH (ref 65–99)
Glucose-Capillary: 142 mg/dL — ABNORMAL HIGH (ref 65–99)
Glucose-Capillary: 207 mg/dL — ABNORMAL HIGH (ref 65–99)

## 2016-01-04 LAB — BASIC METABOLIC PANEL
Anion gap: 9 (ref 5–15)
BUN: 8 mg/dL (ref 6–20)
CALCIUM: 9.2 mg/dL (ref 8.9–10.3)
CHLORIDE: 102 mmol/L (ref 101–111)
CO2: 28 mmol/L (ref 22–32)
CREATININE: 0.68 mg/dL (ref 0.61–1.24)
GFR calc Af Amer: >60 mL/min (ref >60)
GFR calc non Af Amer: >60 mL/min (ref >60)
GLUCOSE: 215 mg/dL — AB (ref 65–99)
Potassium: 4.4 mmol/L (ref 3.5–5.1)
Sodium: 139 mmol/L (ref 135–145)

## 2016-01-04 LAB — CBC
HEMATOCRIT: 44.2 % (ref 39.0–52.0)
HEMOGLOBIN: 14.3 g/dL (ref 13.0–17.0)
MCH: 30.4 pg (ref 26.0–34.0)
MCHC: 32.4 g/dL (ref 30.0–36.0)
MCV: 94 fL (ref 78.0–100.0)
Platelets: 174 10*3/uL (ref 150–400)
RBC: 4.7 MIL/uL (ref 4.22–5.81)
RDW: 13.1 % (ref 11.5–15.5)
WBC: 7.5 10*3/uL (ref 4.0–10.5)

## 2016-01-04 LAB — BLOOD GAS, ARTERIAL
Acid-Base Excess: 5 mmol/L — ABNORMAL HIGH (ref 0.0–2.0)
BICARBONATE: 28.9 mmol/L — AB (ref 20.0–28.0)
Drawn by: 449561
FIO2: 40
LHR: 20 {breaths}/min
O2 Saturation: 98.9 %
PEEP/CPAP: 5 cmH2O
Patient temperature: 98.6
VT: 660 mL
pCO2 arterial: 41.2 mmHg (ref 32.0–48.0)
pH, Arterial: 7.46 — ABNORMAL HIGH (ref 7.350–7.450)
pO2, Arterial: 131 mmHg — ABNORMAL HIGH (ref 83.0–108.0)

## 2016-01-04 LAB — TRIGLYCERIDES: TRIGLYCERIDES: 118 mg/dL (ref <150)

## 2016-01-04 LAB — PHOSPHORUS: PHOSPHORUS: 1.9 mg/dL — AB (ref 2.5–4.6)

## 2016-01-04 LAB — TSH: TSH: 0.436 u[IU]/mL (ref 0.350–4.500)

## 2016-01-04 LAB — MAGNESIUM: Magnesium: 1.8 mg/dL (ref 1.7–2.4)

## 2016-01-04 MED ORDER — ORAL CARE MOUTH RINSE
15.0000 mL | OROMUCOSAL | Status: DC
  Administered 2016-01-04 – 2016-01-05 (×11): 15 mL via OROMUCOSAL

## 2016-01-04 MED ORDER — CITALOPRAM HYDROBROMIDE 10 MG PO TABS
20.0000 mg | ORAL_TABLET | Freq: Every day | ORAL | Status: DC
  Administered 2016-01-04 – 2016-01-06 (×3): 20 mg via ORAL
  Filled 2016-01-04 (×3): qty 2

## 2016-01-04 MED ORDER — DIVALPROEX SODIUM 500 MG PO DR TAB
500.0000 mg | DELAYED_RELEASE_TABLET | Freq: Two times a day (BID) | ORAL | Status: DC
  Administered 2016-01-04 (×2): 500 mg via ORAL
  Filled 2016-01-04 (×2): qty 1
  Filled 2016-01-04: qty 2

## 2016-01-04 MED ORDER — METFORMIN HCL 500 MG PO TABS: 1000.0000 mg | ORAL_TABLET | Freq: Two times a day (BID) | ORAL | Status: DC

## 2016-01-04 MED ORDER — MOMETASONE FURO-FORMOTEROL FUM 200-5 MCG/ACT IN AERO
2.0000 | INHALATION_SPRAY | Freq: Two times a day (BID) | RESPIRATORY_TRACT | Status: DC
  Filled 2016-01-04: qty 8.8

## 2016-01-04 MED ORDER — FUROSEMIDE 10 MG/ML IJ SOLN
40.0000 mg | Freq: Two times a day (BID) | INTRAMUSCULAR | Status: AC
  Administered 2016-01-04 – 2016-01-05 (×3): 40 mg via INTRAVENOUS
  Filled 2016-01-04 (×3): qty 4

## 2016-01-04 MED ORDER — PANTOPRAZOLE SODIUM 40 MG PO TBEC: 40.0000 mg | DELAYED_RELEASE_TABLET | Freq: Every day | ORAL | Status: DC

## 2016-01-04 MED ORDER — POTASSIUM CHLORIDE 20 MEQ/15ML (10%) PO SOLN
40.0000 meq | Freq: Once | ORAL | Status: AC
  Administered 2016-01-04: 40 meq
  Filled 2016-01-04: qty 30

## 2016-01-04 MED ORDER — CLONIDINE HCL 0.2 MG PO TABS
0.2000 mg | ORAL_TABLET | Freq: Two times a day (BID) | ORAL | Status: DC
  Administered 2016-01-04 – 2016-01-06 (×5): 0.2 mg via ORAL
  Filled 2016-01-04: qty 1
  Filled 2016-01-04: qty 2
  Filled 2016-01-04 (×2): qty 1
  Filled 2016-01-04: qty 2
  Filled 2016-01-04 (×4): qty 1

## 2016-01-04 MED ORDER — PROPOFOL 1000 MG/100ML IV EMUL
INTRAVENOUS | Status: AC
  Filled 2016-01-04: qty 100

## 2016-01-04 MED ORDER — INSULIN ASPART 100 UNIT/ML ~~LOC~~ SOLN
0.0000 [IU] | SUBCUTANEOUS | Status: DC
  Administered 2016-01-04 (×3): 1 [IU] via SUBCUTANEOUS
  Administered 2016-01-04: 3 [IU] via SUBCUTANEOUS
  Administered 2016-01-04: 2 [IU] via SUBCUTANEOUS
  Administered 2016-01-05 (×4): 1 [IU] via SUBCUTANEOUS

## 2016-01-04 MED ORDER — FAMOTIDINE IN NACL 20-0.9 MG/50ML-% IV SOLN
20.0000 mg | INTRAVENOUS | Status: DC
  Administered 2016-01-04 – 2016-01-05 (×2): 20 mg via INTRAVENOUS
  Filled 2016-01-04 (×2): qty 50

## 2016-01-04 MED ORDER — CHLORHEXIDINE GLUCONATE 0.12% ORAL RINSE (MEDLINE KIT)
15.0000 mL | Freq: Two times a day (BID) | OROMUCOSAL | Status: DC
  Administered 2016-01-04 – 2016-01-05 (×3): 15 mL via OROMUCOSAL

## 2016-01-04 MED ORDER — ONDANSETRON HCL 4 MG/2ML IJ SOLN
4.0000 mg | Freq: Four times a day (QID) | INTRAMUSCULAR | Status: DC | PRN
  Administered 2016-01-04: 4 mg via INTRAVENOUS
  Filled 2016-01-04: qty 2

## 2016-01-04 MED ORDER — FENTANYL CITRATE (PF) 100 MCG/2ML IJ SOLN
25.0000 ug | INTRAMUSCULAR | Status: DC | PRN
  Administered 2016-01-04 (×4): 50 ug via INTRAVENOUS
  Filled 2016-01-04 (×4): qty 2

## 2016-01-04 MED ORDER — ENOXAPARIN SODIUM 40 MG/0.4ML ~~LOC~~ SOLN
40.0000 mg | SUBCUTANEOUS | Status: DC
  Administered 2016-01-04: 40 mg via SUBCUTANEOUS
  Filled 2016-01-04: qty 0.4

## 2016-01-04 MED ORDER — MIDAZOLAM HCL 2 MG/2ML IJ SOLN
1.0000 mg | INTRAMUSCULAR | Status: DC | PRN
  Administered 2016-01-04: 2 mg via INTRAVENOUS
  Administered 2016-01-04: 4 mg via INTRAVENOUS
  Administered 2016-01-04 (×2): 2 mg via INTRAVENOUS
  Filled 2016-01-04 (×2): qty 2
  Filled 2016-01-04: qty 4
  Filled 2016-01-04: qty 2

## 2016-01-04 MED ORDER — VALPROATE SODIUM 250 MG/5ML PO SOLN
500.0000 mg | Freq: Two times a day (BID) | ORAL | Status: DC
  Administered 2016-01-04 – 2016-01-06 (×4): 500 mg
  Filled 2016-01-04 (×4): qty 10

## 2016-01-04 MED ORDER — INSULIN ASPART 100 UNIT/ML ~~LOC~~ SOLN: 0.0000 [IU] | SUBCUTANEOUS | Status: DC

## 2016-01-04 MED ORDER — ENOXAPARIN SODIUM 80 MG/0.8ML ~~LOC~~ SOLN
80.0000 mg | SUBCUTANEOUS | Status: DC
  Administered 2016-01-05: 80 mg via SUBCUTANEOUS
  Filled 2016-01-04: qty 0.8

## 2016-01-04 MED ORDER — PROPOFOL 1000 MG/100ML IV EMUL
5.0000 ug/kg/min | INTRAVENOUS | Status: DC
  Administered 2016-01-04: 60 ug/kg/min via INTRAVENOUS
  Administered 2016-01-04: 40 ug/kg/min via INTRAVENOUS
  Administered 2016-01-04: 60 ug/kg/min via INTRAVENOUS
  Administered 2016-01-04: 50 ug/kg/min via INTRAVENOUS
  Administered 2016-01-04: 40 ug/kg/min via INTRAVENOUS
  Administered 2016-01-04 (×2): 60 ug/kg/min via INTRAVENOUS
  Administered 2016-01-04 (×4): 50 ug/kg/min via INTRAVENOUS
  Administered 2016-01-04: 30 ug/kg/min via INTRAVENOUS
  Administered 2016-01-05 (×4): 60 ug/kg/min via INTRAVENOUS
  Administered 2016-01-05: 61.32 ug/kg/min via INTRAVENOUS
  Administered 2016-01-05: 60 ug/kg/min via INTRAVENOUS
  Filled 2016-01-04 (×17): qty 100

## 2016-01-04 NOTE — ED Notes (Signed)
[facility]

## 2016-01-04 NOTE — Progress Notes (Signed)
eLink Physician-Brief Progress Note Patient Name: Bryan Gallegos DOB: 05/16/1975 MRN: [id number]   Date of Service  01/04/2016  HPI/Events of Note  Vomiting - QTc interval = 0.42 seconds.   eICU Interventions  Will order:  1. Zofran 4 mg IV Q 6 hours PRN N/V. 2. Monitor QTc interval Q 6 hours. Notify MD if QTc interval > 500 milliseconds.        [name] 01/04/2016, 3:23 PM

## 2016-01-04 NOTE — ED Notes (Signed)
Paged [facility] to [employer], R[employer]ity fundraiser

## 2016-01-04 NOTE — Progress Notes (Signed)
Pt's wife and son at bedside. Pt's wife and son found out the name of patients "breathing machine" he has at home. Son says it has the "word Trilogy" on it. Wife states that the patient had just finished up 36 days in jail this past Tuesday (12/30/15), and "they wouldn't let him, have his machine in there", states his wife.    Will continue to monitor closely.  Glade LloydMelissa Snow, RN

## 2016-01-04 NOTE — H&P (Signed)
PULMONARY / CRITICAL CARE MEDICINE   Name: Bryan Gallegos MRN: [id number] DOB: May 16, 1975    ADMISSION DATE:  01/03/2016 CONSULTATION DATE:  01/03/16  REFERRING MD:  Dr Roxanne Mins  CHIEF COMPLAINT:  Respiratory failure  HISTORY OF PRESENT ILLNESS:   Bryan Gallegos is a 40 y.o. male with medical history significant for Bipolar disorder, asthma, morbid obesity, OSA, and polysubstance abuse with history of narcotic overdose who presented to the ED with weakness.   He was seen in the ED on 01/01/16 after being brought in by EMS unresponsive. He was given 20m of Narcan with slight improvement per report. He was deemed to have a type of encephalopathy but did not wish to stay for further workup in the hospital.  His wife says over the past day, he has been very weak overall. He has required use of a wheelchair / walker for ambulation and has been very sleepy. She says repeatedly, "I have never seen him like this." He has not had fevers, chills, URI symptoms, cough, sputum production, GI upset or urinary symptoms. He has chronic lower back pain with radiation to his legs which has been acting up. His wife says she has been with him the entire time and he has only taken his prescription Xanax (one tab) and a tylenol #3 in the past few days.   In the ED today, he was noted to be somnolent but arousable. BiPAP was initiated and converted to intubation due to worsening respiratory acidosis. ICU was then called for admission.   PAST MEDICAL HISTORY :  He  has a past medical history of Adult ADHD; Anaphylactic reaction; Anxiety; Asthma; [place] Complication of anesthesia; Diabetes mellitus; Difficult intubation; Heart valve disorder; Hyperlipidemia; Hypertension; [place]bidly [place]; OSA (obstructive sleep apnea); and Transient cerebral ischemia.  PAST SURGICAL HISTORY: He  has a past surgical history that includes Carpal tunnel release; Nose surgery; Wisdom tooth extraction; Tooth  extraction; Esophagogastroduodenoscopy (N/A, 04/05/2015); and Esophagogastroduodenoscopy (egd) with propofol (N/A, 05/21/2015).  Allergies  Allergen Reactions  . Atenolol Anaphylaxis  . Lisinopril Anaphylaxis    NO ACE INHIBITORS!!!  . Prednisone Hives  . Ibuprofen Itching and Swelling    Hands and feet swelling  . Tylenol [Acetaminophen] Other (See Comments)    GI Bleed    No current facility-administered medications on file prior to encounter.    Current Outpatient Prescriptions on File Prior to Encounter  Medication Sig  . acetaminophen-codeine (TYLENOL #3) 300-30 MG tablet TAKE 1 TABLET BY MOUTH EVERY 4 HOURS AS NEEDED (Patient taking differently: TAKE 1 TABLET BY MOUTH EVERY 4 HOURS AS NEEDED FOR PAIN)  . albuterol (PROVENTIL HFA;VENTOLIN HFA) 108 (90 Base) MCG/ACT inhaler Inhale 1-2 puffs into the lungs every 6 (six) hours as needed for wheezing or shortness of breath.  . alprazolam (XANAX) 2 MG tablet Take 1-2 mg by mouth 3 (three) times daily as needed for sleep or anxiety.   .Marland Kitchenamphetamine-dextroamphetamine (ADDERALL) 30 MG tablet Take 60 mg by mouth daily.   .Marland Kitchenaspirin 325 MG tablet Take 1 tablet (325 mg total) by mouth daily. For heart health  . citalopram (CELEXA) 20 MG tablet Take 1 tablet (20 mg total) by mouth every morning. (Patient taking differently: Take 20 mg by mouth daily. )  . cloNIDine (CATAPRES) 0.1 MG tablet Take 2 tablets (0.2 mg total) by mouth 2 (two) times daily.  . divalproex (DEPAKOTE) 500 MG DR tablet Take 500 mg by mouth 2 (two) times daily.  . Fluticasone-Salmeterol (  ADVAIR) 250-50 MCG/DOSE AEPB Inhale 1 puff into the lungs 2 (two) times daily.  . insulin glargine (LANTUS) 100 UNIT/ML injection Inject 0.2 mLs (20 Units total) into the skin at bedtime.  . metFORMIN (GLUCOPHAGE) 1000 MG tablet Take 1 tablet (1,000 mg total) by mouth 2 (two) times daily.  Marland Kitchen omeprazole (PRILOSEC) 40 MG capsule Take 1 capsule (40 mg total) by mouth 2 (two) times daily.  .  ranitidine (ZANTAC) 300 MG tablet Take 1 tablet (300 mg total) by mouth at bedtime.  . traZODone (DESYREL) 50 MG tablet Take 1 tablet (50 mg total) by mouth at bedtime. For sleep  . ACCU-CHEK SOFTCLIX LANCETS lancets Use as instructed for 3 times daily testing of blood sugar  . albuterol (PROVENTIL HFA;VENTOLIN HFA) 108 (90 Base) MCG/ACT inhaler Inhale 2 puffs into the lungs every 2 (two) hours as needed for wheezing or shortness of breath. (Patient not taking: Reported on 01/03/2016)  . blood glucose meter kit and supplies KIT Dispense based on patient and insurance preference. Use up to four times daily as directed. (FOR ICD-9 250.00, 250.01).  . Blood Glucose Monitoring Suppl (ACCU-CHEK AVIVA PLUS) w/Device KIT Use as directed to check blood sugar 3 times daily.  Marland Kitchen gabapentin (NEURONTIN) 400 MG capsule Take 2 capsules (800 mg total) by mouth 3 (three) times daily. (Patient not taking: Reported on 01/03/2016)  . glucose blood (ACCU-CHEK AVIVA) test strip Use as instructed for 3 times daily testing of blood sugar  . Lancet Devices (ACCU-CHEK SOFTCLIX) lancets Use as instructed for 3 times daily testing of blood sugar  . [DISCONTINUED] lisinopril (PRINIVIL,ZESTRIL) 40 MG tablet Take 1 tablet (40 mg total) by mouth 2 (two) times daily. For high blood pressure    FAMILY HISTORY:  His indicated that his mother is alive. He indicated that the status of his father is unknown. He indicated that the status of his brother is unknown. He indicated that the status of his maternal grandmother is unknown. He indicated that the status of his maternal grandfather is unknown.    SOCIAL HISTORY: He  reports that he has been smoking Cigarettes.  He has a 11.50 pack-year smoking history. He has never used smokeless tobacco. He reports that he does not drink alcohol or use drugs.  REVIEW OF SYSTEMS:   Complete ROS unable to be obtained as pt is intubated and altered  VITAL SIGNS: BP 128/71   Pulse (!) 54   Resp  20   Ht 6' 2" (1.88 m)   Wt (!) 172.9 kg (381 lb 2.8 oz)   SpO2 99%   BMI 48.94 kg/m   VENTILATOR SETTINGS: Vent Mode: PRVC FiO2 (%):  [40 %-45 %] 40 % Set Rate:  [20 bmp] 20 bmp Vt Set:  [919 mL] 660 mL PEEP:  [5 cmH20] 5 cmH20 Plateau Pressure:  [21 cmH20] 21 cmH20  INTAKE / OUTPUT: I/O last 3 completed shifts: In: -  Out: 600 [Urine:600]  PHYSICAL EXAMINATION: General:  Morbidly obese, sedated comfortably on propofol Neuro:  Unable to do full neuro exam, pupils 3-61m and reactive bilaterally, no facial asymmetry, no clonus HEENT:  MMM, ETT in place Cardiovascular:  RRR no m/r/g Lungs:  CTAB, breathing with ventilator Abdomen:  Soft, ND, NT Musculoskeletal:  No deformities noted Skin:  Few abrasions over knees   LABS:  BMET  Recent Labs Lab 01/01/16 2211 01/01/16 2216 01/02/16 0748 01/03/16 1757  NA 132* 139 137 136  K 3.9 4.3 3.6 3.9  CL 99* 97*  102 99*  CO2 26  --  29 28  BUN _0 CREATININE 0.88 0.90 0.69 0.68  GLUCOSE 241* 239* 109* 227*    Electrolytes  Recent Labs Lab 01/01/16 2211 01/02/16 0748 01/03/16 1757  CALCIUM 8.4* 8.6* 8.9    CBC  Recent Labs Lab 01/01/16 2216 01/01/16 2350 01/03/16 1757  WBC  --  9.5 12.9*  HGB 14.6 14.0 13.8  HCT 43.0 41.5 41.8  PLT  --  178 188    Coag's No results for input(s): APTT, INR in the last 168 hours.  Sepsis Markers  Recent Labs Lab 01/01/16 2350  PROCALCITON 0.14    ABG  Recent Labs Lab 01/03/16 1824 01/03/16 1948 01/03/16 2127  PHART 7.321* 7.294* 7.387  PCO2ART 65.7* 70.8* 48.8*  PO2ART 209.0* 134.0* 91.0    Liver Enzymes  Recent Labs Lab 01/01/16 2211  AST 46*  ALT 42  ALKPHOS 89  BILITOT 0.4  ALBUMIN 3.8    Cardiac Enzymes  Recent Labs Lab 01/03/16 1757  TROPONINI <0.03    Glucose  Recent Labs Lab 01/01/16 2203 01/02/16 0403 01/02/16 0741 01/02/16 1155 01/02/16 1610  GLUCAP 223* 194* 108* 219* 289*    Imaging Dg Chest Port 1  View  Result Date: 01/03/2016 CLINICAL DATA:  Acute respiratory failure. Polysubstance abuse. Intubation. EXAM: PORTABLE CHEST 1 VIEW COMPARISON:  Prior today FINDINGS: Endotracheal tube tip is approximately 5 cm above the carina. Nasogastric tube is seen entering the stomach. Increased patchy airspace opacity is seen in the left lung base. Mild atelectasis or infiltrate also seen in the right lung base, without significant change. Heart size is within normal limits allowing for low lung volumes. IMPRESSION: Endotracheal tube and nasogastric tube in appropriate position. Increased patchy left lower lobe airspace disease. No significant change in mild right basilar atelectasis versus infiltrate. Electronically Signed   By: Earle Gell M.D.   On: 01/03/2016 21:10   Dg Chest Portable 1 View  Result Date: 01/03/2016 CLINICAL DATA:  40 year old male with a history of left-sided chest pain EXAM: PORTABLE CHEST 1 VIEW COMPARISON:  01/02/2016, 01/01/2016 FINDINGS: Cardiomediastinal silhouette unchanged in size and contour. No evidence of central vascular congestion. Low lung volumes accentuates the interstitium with no confluent airspace disease. No pneumothorax or pleural effusion. IMPRESSION: Low lung volumes with no evidence of acute cardiopulmonary disease. Signed, Dulcy Fanny. Earleen Newport, DO [facility] Radiology Electronically Signed   By: Corrie Mckusick D.O.   On: 01/03/2016 18:52   DISCUSSION: with medical history significant for Bipolar disorder, asthma, morbid obesity, OSA, and polysubstance abuse with history of narcotic overdose who presented to the ED with weakness. He was found to have acute hypercarbic respiratory failure and was intubated for mechanical ventilation.   ASSESSMENT / PLAN:  PULMONARY A: Acute hypercarbic respiratory failure  OSA, suspect obesity hypoventilation syndrome as well P:   Continue lung protective mechanical  ventilation  CARDIOVASCULAR A:  Hypertension P:  Continue home clonidine  RENAL A:   Slight metabolic alkalosis may reflect chronic hypoventilation P:   Observe  GASTROINTESTINAL A:   No issues P:   NPO  HEMATOLOGIC A:   No issues P:  DVT prophylaxis with enoxaparin  INFECTIOUS A:   No overt focal infectious symptoms P:   Will continue monitoring  ENDOCRINE A:   DMII on lantus and metformin P:   Metformin and SSI ordered  NEUROLOGIC A:   Alerted mental status, weakness - could be toxic  metabolic from ingestion or hypoventilation, primary CNS pathology (CT Head negative 3 days ago), less likely infectious. P:   RASS goal: 0 Will wean propofol for full neuro exam Consider brain MRI and EEG Consider Spinal imaging Consider LP   FAMILY  - Updates: Wife at bedside was updated as to plan of care  - Inter-disciplinary family meet or Palliative Care meeting due by:  01/10/16  30 mins critical care time spent  [facility] [place] Pager: [phone]  01/04/2016, 3:13 AM

## 2016-01-04 NOTE — Progress Notes (Signed)
Paged Security to clarify if patients [facility]. Security officer confirms that wife is not allowed on [facility] property at this time, and to call Security if she is found on the property. After returning to patients room, wife and son have left, security notified, will continue to monitor patient closely.   Glade LloydMelissa Snow, RN

## 2016-01-04 NOTE — Progress Notes (Signed)
PULMONARY / CRITICAL CARE MEDICINE   Name: Bryan Gallegos MRN: [id number] DOB: 01/10/1976    ADMISSION DATE:  01/03/2016 CONSULTATION DATE:  01/03/16  REFERRING MD:  Dr Preston FleetingGlick  CHIEF COMPLAINT:  Respiratory failure  HISTORY OF PRESENT ILLNESS:   Bryan Gallegos is a 40 y.o. male with medical history significant for Bipolar disorder, asthma, morbid obesity, OSA, and polysubstance abuse with history of narcotic overdose who presented to the ED with weakness, noted to be somnolent but arousable. BiPAP was initiated and converted to intubation due to worsening respiratory acidosis. ICU was then called for admission.   Subjective Sedated on vent   VITAL SIGNS: BP (!) 166/95 (BP Location: Right Arm)   Pulse 74   Temp 97.5 F (36.4 C) (Axillary)   Resp 20   Ht 6\' 2"  (1.88 m)   Wt (!) 381 lb 2.8 oz (172.9 kg)   SpO2 100%   BMI 48.94 kg/m   VENTILATOR SETTINGS: Vent Mode: PRVC FiO2 (%):  [40 %-45 %] 40 % Set Rate:  [20 bmp] 20 bmp Vt Set:  [409[660 mL] 660 mL PEEP:  [5 cmH20] 5 cmH20 Plateau Pressure:  [17 cmH20-21 cmH20] 18 cmH20  INTAKE / OUTPUT: I/O last 3 completed shifts: In: 381 [I.V.:321; Other:10; IV Piggyback:50] Out: 1120 [Urine:1120]  PHYSICAL EXAMINATION: General:  Morbidly obese, sedated comfortably on propofol Neuro:  Unable to do full neuro exam, pupils reactive bilaterally, no facial asymmetry, no clonus HEENT:  MMM, ETT in place Cardiovascular:  RRR no m/r/g Lungs:  CTAB, breathing with ventilator Abdomen:  Soft, ND, NT Musculoskeletal:  No deformities noted Skin:  Few abrasions over knees   LABS:  BMET  Recent Labs Lab 01/02/16 0748 01/03/16 1757 01/04/16 0315  NA 137 136 139  K 3.6 3.9 4.4  CL 102 99* 102  CO2 29 28 28   BUN 8 6 8   CREATININE 0.69 0.68 0.68  GLUCOSE 109* 227* 215*    Electrolytes  Recent Labs Lab 01/02/16 0748 01/03/16 1757 01/04/16 0315  CALCIUM 8.6* 8.9 9.2  MG  --   --  1.8  PHOS  --   --  1.9*     CBC  Recent Labs Lab 01/01/16 2350 01/03/16 1757 01/04/16 0315  WBC 9.5 12.9* 7.5  HGB 14.0 13.8 14.3  HCT 41.5 41.8 44.2  PLT 178 188 174    Coag's No results for input(s): APTT, INR in the last 168 hours.  Sepsis Markers  Recent Labs Lab 01/01/16 2350  PROCALCITON 0.14    ABG  Recent Labs Lab 01/03/16 1948 01/03/16 2127 01/04/16 0400  PHART 7.294* 7.387 7.460*  PCO2ART 70.8* 48.8* 41.2  PO2ART 134.0* 91.0 131*    Liver Enzymes  Recent Labs Lab 01/01/16 2211  AST 46*  ALT 42  ALKPHOS 89  BILITOT 0.4  ALBUMIN 3.8    Cardiac Enzymes  Recent Labs Lab 01/03/16 1757  TROPONINI <0.03    Glucose  Recent Labs Lab 01/02/16 0403 01/02/16 0741 01/02/16 1155 01/02/16 1610 01/04/16 0320 01/04/16 0751  GLUCAP 194* 108* 219* 289* 207* 152*    Imaging Dg Chest Port 1 View  Result Date: 01/03/2016 CLINICAL DATA:  Acute respiratory failure. Polysubstance abuse. Intubation. EXAM: PORTABLE CHEST 1 VIEW COMPARISON:  Prior today FINDINGS: Endotracheal tube tip is approximately 5 cm above the carina. Nasogastric tube is seen entering the stomach. Increased patchy airspace opacity is seen in the left lung base. Mild atelectasis or infiltrate also seen in the right lung base,  without significant change. Heart size is within normal limits allowing for low lung volumes. IMPRESSION: Endotracheal tube and nasogastric tube in appropriate position. Increased patchy left lower lobe airspace disease. No significant change in mild right basilar atelectasis versus infiltrate. Electronically Signed   By: Myles Rosenthal M.D.   On: 01/03/2016 21:10   Dg Chest Portable 1 View  Result Date: 01/03/2016 CLINICAL DATA:  40 year old male with a history of left-sided chest pain EXAM: PORTABLE CHEST 1 VIEW COMPARISON:  01/02/2016, 01/01/2016 FINDINGS: Cardiomediastinal silhouette unchanged in size and contour. No evidence of central vascular congestion. Low lung volumes accentuates  the interstitium with no confluent airspace disease. No pneumothorax or pleural effusion. IMPRESSION: Low lung volumes with no evidence of acute cardiopulmonary disease. Signed, Yvone Neu. Loreta Ave, DO [facility] Radiology Electronically Signed   By: Gilmer Mor D.O.   On: 01/03/2016 18:52  mild vascular congestion   DISCUSSON: with medical history significant for Bipolar disorder, asthma, morbid obesity, OSA, and polysubstance abuse with history of narcotic overdose who presented to the ED with weakness. He was found to have acute hypercarbic respiratory failure and was intubated for mechanical ventilation. It is unclear if he has CPAP or Trilogy at home. He does have split study ordered. The likely explanation for his acute on chronic failure is either 1) primarily his narcotics 2) non-adherence to his nocturnal ventilatory support or 3) inadequate nocturnal ventilatory support. We had felt he would benefit from Trilogy. If he only has CPAP at this point his slow decline is likely because he also needs ventilatory support. If he has the trilogy and is using this then we need to re-explore the impact of narcotics further. His wife was found passed out w/ needle in the hospital BR so certainly possible IVDA is a additional concern here. For today the goal is diuresis, ck echo, cont supportive care.    ASSESSMENT / PLAN:  PULMONARY A: Acute on chronic hypercarbic respiratory failure  OSA, suspect obesity hypoventilation syndrome as well H/o LPR and VCD Appears like baseline CO2 about 50.  Suspect that this is a mix of non-compliance OR inadequate nocturnal support (failed CPAP).  P:   Continue lung protective mechanical ventilation Lasix as BP/BUN allow  Echo to eval RV fxn and screen for secondary PAH (last one was NML about a year ago)  CARDIOVASCULAR A:  Hypertension P:  Continue home clonidine  RENAL A:   Chronic respiratory acidosis w/  metabolic compensation  P:   F/u chemistry  Lasix as above Replace lytes as needed.   GASTROINTESTINAL A:   Morbid obesity  P:   NPO H2B for SUP  HEMATOLOGIC A:   No issues P:  DVT prophylaxis with enoxaparin  INFECTIOUS A:   No overt focal infectious symptoms P:   Will continue monitoring  ENDOCRINE A:   DMII on lantus and metformin P:   Hold metformin SSI ordered  NEUROLOGIC A:   Acute encephalopathy in setting of hypercarbia Likely complicated by narcotics.  P:   RASS goal:-2 for today Will need to re-eval narcotic requirements.    FAMILY  - Updates: Wife at bedside was updated as to plan of care  - Inter-disciplinary family meet or Palliative Care meeting due by:  01/10/16  Simonne Martinet ACNP-BC [facility] Pulmonary/Critical Care Pager # [phone] OR # [phone] if no answer  Attending Note:  I have examined patient, reviewed labs, studies and notes. I have discussed the case with Kreg Shropshire,  and I agree with the data and plans as amended above.  39 with OSA / OHS, also probable narcotics use, admitted with hypercapneic resp failure requiring MV. It has been recommended thathe has nocturnal ventilation at home but this has not been done. On my eval he is obese, stable on MV, sedated on propofol. Lungs are clear. VS stable. We will continue current MV, attempt to diurese empirically, treat HTN. Suspect he will be ready for SBT on 11/6. Once extubated will need to clarify whether he gets a Trilogy or BiPAP for home. Obviously he will also need to modify his narcotics use.  Independent critical care time is 40 minutes.   Levy Pupaobert Curley Fayette, MD, PhD 01/04/2016, 12:21 PM [facility] [phone] or if no answer [phone]

## 2016-01-05 ENCOUNTER — Inpatient Hospital Stay (HOSPITAL_COMMUNITY): Payer: Medicare Other

## 2016-01-05 ENCOUNTER — Ambulatory Visit (HOSPITAL_COMMUNITY): Payer: Medicare Other

## 2016-01-05 DIAGNOSIS — J9621 Acute and chronic respiratory failure with hypoxia: Secondary | ICD-10-CM

## 2016-01-05 DIAGNOSIS — G9340 Encephalopathy, unspecified: Secondary | ICD-10-CM

## 2016-01-05 DIAGNOSIS — J9622 Acute and chronic respiratory failure with hypercapnia: Principal | ICD-10-CM

## 2016-01-05 DIAGNOSIS — R0600 Dyspnea, unspecified: Secondary | ICD-10-CM

## 2016-01-05 DIAGNOSIS — R911 Solitary pulmonary nodule: Secondary | ICD-10-CM

## 2016-01-05 LAB — COMPREHENSIVE METABOLIC PANEL
ALT: 92 U/L — ABNORMAL HIGH (ref 17–63)
AST: 81 U/L — AB (ref 15–41)
Albumin: 3.7 g/dL (ref 3.5–5.0)
Alkaline Phosphatase: 79 U/L (ref 38–126)
Anion gap: 17 — ABNORMAL HIGH (ref 5–15)
BILIRUBIN TOTAL: 1.9 mg/dL — AB (ref 0.3–1.2)
BUN: 10 mg/dL (ref 6–20)
CO2: 25 mmol/L (ref 22–32)
CREATININE: 0.79 mg/dL (ref 0.61–1.24)
Calcium: 9.3 mg/dL (ref 8.9–10.3)
Chloride: 99 mmol/L — ABNORMAL LOW (ref 101–111)
Glucose, Bld: 120 mg/dL — ABNORMAL HIGH (ref 65–99)
POTASSIUM: 4.2 mmol/L (ref 3.5–5.1)
Sodium: 141 mmol/L (ref 135–145)
TOTAL PROTEIN: 7.2 g/dL (ref 6.5–8.1)

## 2016-01-05 LAB — GLUCOSE, CAPILLARY
GLUCOSE-CAPILLARY: 114 mg/dL — AB (ref 65–99)
GLUCOSE-CAPILLARY: 116 mg/dL — AB (ref 65–99)
GLUCOSE-CAPILLARY: 128 mg/dL — AB (ref 65–99)
Glucose-Capillary: 258 mg/dL — ABNORMAL HIGH (ref 65–99)
Glucose-Capillary: 132 mg/dL — ABNORMAL HIGH (ref 65–99)
Glucose-Capillary: 115 mg/dL — ABNORMAL HIGH (ref 65–99)
Glucose-Capillary: 131 mg/dL — ABNORMAL HIGH (ref 65–99)

## 2016-01-05 LAB — CBC
HEMATOCRIT: 49.7 % (ref 39.0–52.0)
Hemoglobin: 16.6 g/dL (ref 13.0–17.0)
MCH: 30.9 pg (ref 26.0–34.0)
MCHC: 33.4 g/dL (ref 30.0–36.0)
MCV: 92.4 fL (ref 78.0–100.0)
PLATELETS: 150 10*3/uL (ref 150–400)
RBC: 5.38 MIL/uL (ref 4.22–5.81)
RDW: 13.3 % (ref 11.5–15.5)
WBC: 12.7 10*3/uL — AB (ref 4.0–10.5)

## 2016-01-05 LAB — ECHOCARDIOGRAM COMPLETE
Height: 74 in
WEIGHTICAEL: 5936.55 [oz_av]

## 2016-01-05 LAB — BLOOD GAS, ARTERIAL
Acid-Base Excess: 9.4 mmol/L — ABNORMAL HIGH (ref 0.0–2.0)
BICARBONATE: 32.4 mmol/L — AB (ref 20.0–28.0)
Drawn by: 44135
FIO2: 40
LHR: 20 {breaths}/min
O2 SAT: 95.1 %
PEEP: 5 cmH2O
Patient temperature: 97.7
VT: 660 mL
pCO2 arterial: 34.6 mmHg (ref 32.0–48.0)
pH, Arterial: 7.576 — ABNORMAL HIGH (ref 7.350–7.450)
pO2, Arterial: 67 mmHg — ABNORMAL LOW (ref 83.0–108.0)

## 2016-01-05 LAB — TRIGLYCERIDES: TRIGLYCERIDES: 358 mg/dL — AB (ref <150)

## 2016-01-05 LAB — HEMOGLOBIN A1C
Hgb A1c MFr Bld: 6 % — ABNORMAL HIGH (ref 4.8–5.6)
MEAN PLASMA GLUCOSE: 126 mg/dL

## 2016-01-05 LAB — MAGNESIUM: Magnesium: 1.9 mg/dL (ref 1.7–2.4)

## 2016-01-05 MED ORDER — FUROSEMIDE 10 MG/ML IJ SOLN
40.0000 mg | Freq: Three times a day (TID) | INTRAMUSCULAR | Status: AC
  Administered 2016-01-05 (×2): 40 mg via INTRAVENOUS
  Filled 2016-01-05 (×2): qty 4

## 2016-01-05 MED ORDER — ORAL CARE MOUTH RINSE
15.0000 mL | Freq: Two times a day (BID) | OROMUCOSAL | Status: DC
  Administered 2016-01-05 – 2016-01-06 (×3): 15 mL via OROMUCOSAL

## 2016-01-05 MED ORDER — PERFLUTREN LIPID MICROSPHERE
1.0000 mL | INTRAVENOUS | Status: AC | PRN
  Administered 2016-01-05: 2 mL via INTRAVENOUS
  Filled 2016-01-05: qty 10

## 2016-01-05 NOTE — Progress Notes (Signed)
  Echocardiogram 2D Echocardiogram with Definity has been performed.  Leta JunglingCooper, Saivon Prowse M 01/05/2016, 3:29 PM

## 2016-01-05 NOTE — Procedures (Signed)
Extubation Procedure Note  Patient Details:   Name: Bryan Gallegos DOB: 05/29/1975 MRN: [id number]   Airway Documentation:     Evaluation  O2 sats: stable throughout Complications: No apparent complications Patient did tolerate procedure well. Bilateral Breath Sounds: Clear, Diminished   Yes   Pt extubated to 4L N/C, no stridor noted.  RN at bedside.  Christophe LouisSteven D Tahje Borawski 01/05/2016, 11:04 AM

## 2016-01-05 NOTE — Progress Notes (Signed)
PULMONARY / CRITICAL CARE MEDICINE   Name: Bryan Gallegos MRN: [id number] DOB: May 22, 1975    ADMISSION DATE:  01/03/2016 CONSULTATION DATE:  01/03/16  REFERRING MD:  Dr Preston Fleeting  CHIEF COMPLAINT:  Respiratory failure  HISTORY OF PRESENT ILLNESS:   Bryan Gallegos is a 40 y.o. male with medical history significant for Bipolar disorder, asthma, morbid obesity, OSA, and polysubstance abuse with history of narcotic overdose who presented to the ED with weakness, noted to be somnolent but arousable. BiPAP was initiated and converted to intubation due to worsening respiratory acidosis. ICU was then called for admission.   Subjective Sedated on vent   VITAL SIGNS: BP (!) 150/86   Pulse 82   Temp 97.4 F (36.3 C) (Oral)   Resp (!) 21   Ht 6\' 2"  (1.88 m)   Wt (!) 168.3 kg (371 lb 0.6 oz)   SpO2 100%   BMI 47.64 kg/m   VENTILATOR SETTINGS: Vent Mode: CPAP;PSV FiO2 (%):  [40 %] 40 % Set Rate:  [20 bmp] 20 bmp Vt Set:  [409 mL] 660 mL PEEP:  [5 cmH20] 5 cmH20 Pressure Support:  [10 cmH20] 10 cmH20 Plateau Pressure:  [17 cmH20-19 cmH20] 17 cmH20  INTAKE / OUTPUT: I/O last 3 completed shifts: In: 2594.8 [I.V.:1664.8; Other:10; NG/GT:820; IV Piggyback:100] Out: 7760 [Urine:6830; Emesis/NG output:930]  PHYSICAL EXAMINATION: General:  Morbidly obese, sedated comfortably on propofol Neuro:  Unable to do full neuro exam, pupils reactive bilaterally, no facial asymmetry, no clonus HEENT:  MMM, ETT in place Cardiovascular:  RRR no m/r/g Lungs:  CTAB, breathing with ventilator Abdomen:  Soft, ND, NT Musculoskeletal:  No deformities noted Skin:  Few abrasions over knees   LABS:  BMET  Recent Labs Lab 01/03/16 1757 01/04/16 0315 01/05/16 0214  NA 136 139 141  K 3.9 4.4 4.2  CL 99* 102 99*  CO2 28 28 25   BUN 6 8 10   CREATININE 0.68 0.68 0.79  GLUCOSE 227* 215* 120*    Electrolytes  Recent Labs Lab 01/03/16 1757 01/04/16 0315 01/05/16 0214  CALCIUM 8.9 9.2 9.3   MG  --  1.8 1.9  PHOS  --  1.9*  --     CBC  Recent Labs Lab 01/03/16 1757 01/04/16 0315 01/05/16 0214  WBC 12.9* 7.5 12.7*  HGB 13.8 14.3 16.6  HCT 41.8 44.2 49.7  PLT 188 174 150    Coag's No results for input(s): APTT, INR in the last 168 hours.  Sepsis Markers  Recent Labs Lab 01/01/16 2350  PROCALCITON 0.14    ABG  Recent Labs Lab 01/03/16 2127 01/04/16 0400 01/05/16 0410  PHART 7.387 7.460* 7.576*  PCO2ART 48.8* 41.2 34.6  PO2ART 91.0 131* 67.0*    Liver Enzymes  Recent Labs Lab 01/01/16 2211 01/05/16 0214  AST 46* 81*  ALT 42 92*  ALKPHOS 89 79  BILITOT 0.4 1.9*  ALBUMIN 3.8 3.7    Cardiac Enzymes  Recent Labs Lab 01/03/16 1757  TROPONINI <0.03    Glucose  Recent Labs Lab 01/04/16 1135 01/04/16 1559 01/04/16 2033 01/04/16 2339 01/05/16 0347 01/05/16 0759  GLUCAP 142* 143* 143* 134* 132* 115*    Imaging Dg Chest Port 1 View  Result Date: 01/05/2016 CLINICAL DATA:  Pulmonary edema EXAM: PORTABLE CHEST 1 VIEW COMPARISON:  01/03/2016 FINDINGS: Endotracheal tube remains in good position. Gastric tube in the stomach. Improved aeration of the bases with decrease in patchy airspace disease right base since the prior study. Nodular density left lung base  is unchanged most compatible with atelectasis or infiltrate. No mass was seen on the CT chest of 10/25/2015 in this area. No pleural effusion. IMPRESSION: Endotracheal tube in good position Patchy airspace disease in the right base is improved. Nodular airspace disease in the left base is unchanged. Electronically Signed   By: Marlan Palauharles  Clark M.D.   On: 01/05/2016 06:48  mild vascular congestion   DISCUSSON: with medical history significant for Bipolar disorder, asthma, morbid obesity, OSA, and polysubstance abuse with history of narcotic overdose who presented to the ED with weakness. He was found to have acute hypercarbic respiratory failure and was intubated for mechanical  ventilation. It is unclear if he has CPAP or Trilogy at home. He does have split study ordered. The likely explanation for his acute on chronic failure is either 1) primarily his narcotics 2) non-adherence to his nocturnal ventilatory support or 3) inadequate nocturnal ventilatory support. We had felt he would benefit from Trilogy. If he only has CPAP at this point his slow decline is likely because he also needs ventilatory support. If he has the trilogy and is using this then we need to re-explore the impact of narcotics further. His wife was found passed out w/ needle in the hospital BR so certainly possible IVDA is a additional concern here. For today the goal is diuresis, ck echo, cont supportive care.    ASSESSMENT / PLAN:  PULMONARY A: Acute on chronic hypercarbic respiratory failure  OSA, suspect obesity hypoventilation syndrome as well H/o LPR and VCD Appears like baseline CO2 about 50.  Suspect that this is a mix of non-compliance OR inadequate nocturnal support (failed CPAP).  ? Of pulmonary granuloma on the left P:   Extubate. Titrate O2 for sat of 88-92%. Ambulate. Lasix 40 mg IV q8 x2 doses. Echo to eval RV fxn and screen for secondary PAH (last one was NML about a year ago) Will need a chest CT when more stable to evaluate granuloma vs mass seen on CXR  CARDIOVASCULAR A:  Hypertension P:  Continue home clonidine  RENAL A:   Chronic respiratory acidosis w/ metabolic compensation  P:   F/u chemistry  Lasix as above Replace lytes as needed.   GASTROINTESTINAL A:   Morbid obesity  P:   NPO H2B for SUP  HEMATOLOGIC A:   No issues P:  DVT prophylaxis with enoxaparin  INFECTIOUS A:   No overt focal infectious symptoms P:   Will continue monitoring  ENDOCRINE A:   DMII on lantus and metformin P:   Hold metformin SSI ordered  NEUROLOGIC A:   Acute encephalopathy in setting of hypercarbia Likely complicated by narcotics.  P:   RASS goal:-2 for  today Will need to re-eval narcotic requirements.   FAMILY  - Updates: Patient updated bedside.  Wife not allowed on premises due to drug abuse issues.  - Inter-disciplinary family meet or Palliative Care meeting due by:  01/10/16  The patient is critically ill with multiple organ systems failure and requires high complexity decision making for assessment and support, frequent evaluation and titration of therapies, application of advanced monitoring technologies and extensive interpretation of multiple databases.   Critical Care Time devoted to patient care services described in this note is  35  Minutes. This time reflects time of care of this signee Dr Koren BoundWesam Jhamari Markowicz. This critical care time does not reflect procedure time, or teaching time or supervisory time of PA/NP/Med student/Med Resident etc but could involve care discussion time.  Erik Burkett G.  Molli KnockYacoub, M.D. [facility]. Pager: [phone]. After hours pager: [phone].

## 2016-01-06 DIAGNOSIS — J811 Chronic pulmonary edema: Secondary | ICD-10-CM

## 2016-01-06 DIAGNOSIS — R55 Syncope and collapse: Secondary | ICD-10-CM

## 2016-01-06 DIAGNOSIS — J9622 Acute and chronic respiratory failure with hypercapnia: Secondary | ICD-10-CM

## 2016-01-06 DIAGNOSIS — J810 Acute pulmonary edema: Secondary | ICD-10-CM

## 2016-01-06 LAB — CBC
HCT: 46.4 % (ref 39.0–52.0)
Hemoglobin: 15.9 g/dL (ref 13.0–17.0)
MCH: 31.1 pg (ref 26.0–34.0)
MCHC: 34.3 g/dL (ref 30.0–36.0)
MCV: 90.8 fL (ref 78.0–100.0)
PLATELETS: 207 10*3/uL (ref 150–400)
RBC: 5.11 MIL/uL (ref 4.22–5.81)
RDW: 13.8 % (ref 11.5–15.5)
WBC: 9.3 10*3/uL (ref 4.0–10.5)

## 2016-01-06 LAB — GLUCOSE, CAPILLARY
GLUCOSE-CAPILLARY: 108 mg/dL — AB (ref 65–99)
Glucose-Capillary: 111 mg/dL — ABNORMAL HIGH (ref 65–99)

## 2016-01-06 LAB — BASIC METABOLIC PANEL
Anion gap: 18 — ABNORMAL HIGH (ref 5–15)
BUN: 12 mg/dL (ref 6–20)
CO2: 30 mmol/L (ref 22–32)
CREATININE: 0.8 mg/dL (ref 0.61–1.24)
Calcium: 9 mg/dL (ref 8.9–10.3)
Chloride: 96 mmol/L — ABNORMAL LOW (ref 101–111)
GFR calc Af Amer: >60 mL/min (ref >60)
GLUCOSE: 106 mg/dL — AB (ref 65–99)
POTASSIUM: 3 mmol/L — AB (ref 3.5–5.1)
Sodium: 144 mmol/L (ref 135–145)

## 2016-01-06 LAB — PHOSPHORUS: Phosphorus: 5 mg/dL — ABNORMAL HIGH (ref 2.5–4.6)

## 2016-01-06 LAB — TRIGLYCERIDES: TRIGLYCERIDES: 327 mg/dL — AB (ref <150)

## 2016-01-06 LAB — MAGNESIUM: Magnesium: 1.8 mg/dL (ref 1.7–2.4)

## 2016-01-06 MED ORDER — POTASSIUM CHLORIDE 20 MEQ/15ML (10%) PO SOLN
30.0000 meq | ORAL | Status: AC
  Administered 2016-01-06 (×2): 30 meq via ORAL
  Filled 2016-01-06 (×2): qty 30

## 2016-01-06 MED ORDER — FAMOTIDINE 20 MG PO TABS
20.0000 mg | ORAL_TABLET | Freq: Every day | ORAL | Status: DC
  Administered 2016-01-06: 20 mg via ORAL
  Filled 2016-01-06: qty 1

## 2016-01-06 NOTE — Evaluation (Signed)
Physical Therapy Evaluation Patient Details Name: Bryan Gallegos MRN: [id number] DOB: 03/08/1975 Today's Date: 01/06/2016   History of Present Illness  Pt adm with respiratory failure and intubated. Extubated 11/6. PMH - morbid obesity, polysubstance abuse, bipolar  Clinical Impression  Pt doing well with mobility and no further PT needed.  Ready for dc from PT standpoint.      Follow Up Recommendations No PT follow up    Equipment Recommendations  None recommended by PT    Recommendations for Other Services       Precautions / Restrictions Precautions Precautions: None Restrictions Weight Bearing Restrictions: No      Mobility  Bed Mobility Overal bed mobility: Independent                Transfers Overall transfer level: Independent                  Ambulation/Gait Ambulation/Gait assistance: Independent Ambulation Distance (Feet): 350 Feet Assistive device: None Gait Pattern/deviations: WFL(Within Functional Limits);Wide base of support   Gait velocity interpretation: at or above normal speed for age/gender General Gait Details: Steady gait  Stairs            Wheelchair Mobility    Modified Rankin (Stroke Patients Only)       Balance Overall balance assessment: Independent                                           Pertinent Vitals/Pain Pain Assessment: No/denies pain    Home Living Family/patient expects to be discharged to:: Private residence Living Arrangements: Spouse/significant other Available Help at Discharge: Family;Available PRN/intermittently Type of Home: House Home Access: Level entry     Home Layout: One level Home Equipment: None      Prior Function Level of Independence: Independent               Hand Dominance   Dominant Hand: Right    Extremity/Trunk Assessment   Upper Extremity Assessment: Overall WFL for tasks assessed           Lower Extremity Assessment:  Overall WFL for tasks assessed         Communication   Communication: No difficulties  Cognition Arousal/Alertness: Awake/alert Behavior During Therapy: WFL for tasks assessed/performed Overall Cognitive Status: Within Functional Limits for tasks assessed                      General Comments      Exercises     Assessment/Plan    PT Assessment Patent does not need any further PT services  PT Problem List            PT Treatment Interventions      PT Goals (Current goals can be found in the Care Plan section)  Acute Rehab PT Goals PT Goal Formulation: All assessment and education complete, DC therapy    Frequency     Barriers to discharge        Co-evaluation               End of Session   Activity Tolerance: Patient tolerated treatment well Patient left: in chair;with call bell/phone within reach Nurse Communication: Mobility status         Time: [id number] PT Time Calculation (min) (ACUTE ONLY): 8 min   Charges:   PT Evaluation $PT Eval Low Complexity: 1  Procedure     PT G Codes:        Bryan Gallegos 01/06/2016, 11:16 AM Bryan Gallegos PT [phone]

## 2016-01-06 NOTE — Progress Notes (Signed)
eLink Physician-Brief Progress Note Patient Name: Bryan DuosMitchell L Rojek DOB: 05/05/1975 MRN: [id number]   Date of Service  01/06/2016  HPI/Events of Note  Potassium 3.0. Received IV Lasix yesterday. Creatinine 0.8. Magnesium 1.8. Taking oral medications.   eICU Interventions  KCl 30 mEq by mouth 2 doses      Intervention Category Intermediate Interventions: Electrolyte abnormality - evaluation and management  Lawanda CousinsJennings Nestor 01/06/2016, 3:56 AM

## 2016-01-06 NOTE — Discharge Summary (Signed)
Physician Discharge Summary  Patient ID: DAIWIK BUFFALO MRN: [id number] DOB/AGE: [id number] y.o.  Admit date: 01/03/2016 Discharge date: 01/06/2016  Problem List Active Problems:   Respiratory failure (HCC)   Acute and chronic respiratory failure with hypercapnia (HCC)   Pulmonary edema   Syncope  HPI: Bryan Gallegos is a 40 y.o. male withmedical history significant for Bipolar disorder, asthma, morbid obesity, OSA, and polysubstance abuse with history of narcotic overdose who presented to the ED with weakness.   He was seen in the ED on 01/01/16 after being brought in by EMS unresponsive. He was given '2mg'$  of Narcan with slight improvement per report. He was deemed to have a type of encephalopathy but did not wish to stay for further workup in the hospital.  His wife says over the past day, he has been very weak overall. He has required use of a wheelchair / walker for ambulation and has been very sleepy. She says repeatedly, "I have never seen him like this." He has not had fevers, chills, URI symptoms, cough, sputum production, GI upset or urinary symptoms. He has chronic lower back pain with radiation to his legs which has been acting up. His wife says she has been with him the entire time and he has only taken his prescription Xanax (one tab) and a tylenol #3 in the past few days.   In the ED today, he was noted to be somnolent but arousable. BiPAP was initiated and converted to intubation due to worsening respiratory acidosis. ICU was then called for admission.   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  Suspect that this is a mix of non-compliance OR inadequate nocturnal support (failed CPAP).  ? Of pulmonary granuloma on the left P:   Extubated and in no distress Titrate O2 for sat of 88-92%. Ambulate. Lasix 40 mg IV q8 x2 doses. Echo to eval RV fxn and screen for secondary PAH (last one was NML about a year ago) Will need a chest CT when more stable to evaluate granuloma vs mass seen on CXR  CARDIOVASCULAR A:  Hypertension P:  Continue home clonidine  RENAL A:   Chronic respiratory acidosis w/ metabolic compensation  P:   F/u chemistry  Lasix as above Replace lytes as needed.   GASTROINTESTINAL A:   Morbid obesity admit weight 383 ilbs and dc weight 353 lbs P:   Low car diet Wt lose  HEMATOLOGIC A:   No issues P:  DVT prophylaxis with enoxaparin  INFECTIOUS A:   No overt focal infectious symptoms P:   Will continue monitoring  ENDOCRINE A:   DMII on lantus and metformin P:   Hold metformin SSI ordered  NEUROLOGIC A:   Acute encephalopathy in setting of hypercarbia Likely complicated by narcotics.  P:   RASS goal:-2 for today Will need to re-eval narcotic requirements.       Labs at discharge Lab Results  Component Value Date   CREATININE 0.80 01/06/2016   BUN 12 01/06/2016  NA 144 01/06/2016   K 3.0 (L) 01/06/2016   CL 96 (L) 01/06/2016   CO2 30 01/06/2016   Lab Results  Component Value Date   WBC 9.3 01/06/2016   HGB 15.9 01/06/2016   HCT 46.4 01/06/2016   MCV 90.8 01/06/2016   PLT 207 01/06/2016   Lab Results  Component Value Date   ALT 92 (H) 01/05/2016   AST 81 (H) 01/05/2016   ALKPHOS 79 01/05/2016   BILITOT 1.9 (H) 01/05/2016   Lab Results  Component Value Date   INR 1.07 04/08/2015   INR 1.11 03/30/2015   INR 1.08 08/18/2014   2D  01/05/16  ------------------------------------------------------------------- LV EF: 60% -   65%  ------------------------------------------------------------------- Indications:      Dyspnea 786.09.  ------------------------------------------------------------------- History:   PMH:  Bipolar. Obstructive Sleep Apnea. Asthma. Polysubstance Abuse.  ------------------------------------------------------------------- Study Conclusions  - Left ventricle: The cavity size was normal. There was mild   concentric hypertrophy. Systolic function was normal. The   estimated ejection fraction was in the range of 60% to 65%. Wall   motion was normal; there were no regional wall motion   abnormalities. Doppler parameters are consistent with abnormal   left ventricular relaxation (grade 1 diastolic dysfunction).  Impressions:  - Compared to the prior study, there has been no significant   interval change.  ------------------------------------------------------------------- Study data:  Comparison was made to the study of 09/03/2013.  Study status:  Routine.  Procedure:  The patient reported no pain pre or post test. Transthoracic echocardiography. Image quality was suboptimal. The study was technically difficult, as a result of poor patient compliance and body habitus. Intravenous contrast (Definity) was administered.  Study completion:  There were no complications.          Transthoracic echocardiography.  M-mode, complete 2D, spectral Doppler, and color Doppler.  Birthdate: Patient birthdate: October 10, 1975.  Age:  Patient is 40 yr old.  Sex: Gender: male.    BMI: 47.6 kg/m^2.  Blood pressure:     136/71 Patient status:  Inpatient.  Study date:  Study date: 01/05/2016. Study time: 02:50 PM.  Location:  ICU/CCU  -------------------------------------------------------------------  ------------------------------------------------------------------- Left ventricle:  The cavity size was  normal. There was mild concentric hypertrophy. Systolic function was normal. The estimated ejection fraction was in the range of 60% to 65%. Wall motion was normal; there were no regional wall motion abnormalities. Doppler parameters are consistent with abnormal left ventricular relaxation (grade 1 diastolic dysfunction).  ------------------------------------------------------------------- Aortic valve:   Trileaflet; normal thickness leaflets. Mobility was not restricted.  Doppler:  Transvalvular velocity was within the normal range. There was no stenosis. There was no regurgitation.   ------------------------------------------------------------------- Aorta:  Aortic root: The aortic root was normal in size.  ------------------------------------------------------------------- Mitral valve:   Structurally normal valve.   Mobility was not restricted.  Doppler:  Transvalvular velocity was within the normal range. There was no evidence for stenosis. There was no regurgitation.  ------------------------------------------------------------------- Left atrium:  The atrium was normal in size.  ------------------------------------------------------------------- Right ventricle:  The cavity size was normal. Wall thickness was normal. Systolic function was normal.  ------------------------------------------------------------------- Pulmonic valve:   Poorly visualized.  Structurally normal valve. Cusp separation was normal.  Doppler:  Transvalvular velocity was within the normal range. There was no evidence for stenosis. There was no regurgitation.  ------------------------------------------------------------------- Tricuspid valve:   Structurally normal valve.    Doppler: Transvalvular velocity was within the normal range. There was no regurgitation.  ------------------------------------------------------------------- Pulmonary artery:   The  main pulmonary artery was  normal-sized. Systolic pressure was within the normal range.  ------------------------------------------------------------------- Right atrium:  The atrium was normal in size.  ------------------------------------------------------------------- Pericardium:  There was no pericardial effusion.  ------------------------------------------------------------------- Systemic veins: Inferior vena cava: The vessel was normal in size.  ------------------------------------------------------------------- Measurements   Left ventricle                           Value    Reference  LV ID, ED, PLAX chordal        (L)       32.8  mm 43 - 52  LV ID, ES, PLAX chordal                  33.3  mm 23 - 38  LV fx shortening, PLAX chordal (L)       -2    %  >=29  LV PW thickness, ED                      13.9  mm ---------  IVS/LV PW ratio, ED                      1.01     <=1.3    Ventricular septum                       Value    Reference  IVS thickness, ED                        14.1  mm ---------  Legend: (L)  and  (H)  mark values outside specified reference range.  ------------------------------------------------------------------- Prepared and Electronically Authenticated by  Candee Furbish, M.D. 2017-11-06T15:54:26   Current radiology studies Dg Chest Port 1 View  Result Date: 01/05/2016 CLINICAL DATA:  Pulmonary edema EXAM: PORTABLE CHEST 1 VIEW COMPARISON:  01/03/2016 FINDINGS: Endotracheal tube remains in good position. Gastric tube in the stomach. Improved aeration of the bases with decrease in patchy airspace disease right base since the prior study. Nodular density left lung base is unchanged most compatible with atelectasis or infiltrate. No mass was seen on the CT chest of 10/25/2015 in this area. No pleural effusion. IMPRESSION: Endotracheal tube in good position Patchy airspace disease in the right base is improved. Nodular airspace disease in the left base is unchanged.  Electronically Signed   By: Franchot Gallo M.D.   On: 01/05/2016 06:48    Disposition:  07-Left Against Medical Advice/Left Without Being Seen/Elopement     Medication List    TAKE these medications   ACCU-CHEK AVIVA PLUS w/Device Kit Use as directed to check blood sugar 3 times daily.   accu-chek softclix lancets Use as instructed for 3 times daily testing of blood sugar   ACCU-CHEK SOFTCLIX LANCETS lancets Use as instructed for 3 times daily testing of blood sugar   acetaminophen-codeine 300-30 MG tablet Commonly known as:  TYLENOL #3 TAKE 1 TABLET BY MOUTH EVERY 4 HOURS AS NEEDED What changed:  See the new instructions.   albuterol 108 (90 Base) MCG/ACT inhaler Commonly known as:  PROVENTIL HFA;VENTOLIN HFA Inhale 2 puffs into the lungs every 2 (two) hours as needed for wheezing or shortness of breath.   albuterol 108 (90 Base) MCG/ACT inhaler Commonly known as:  PROVENTIL HFA;VENTOLIN HFA Inhale 1-2 puffs into the lungs every 6 (six) hours as needed for wheezing or  shortness of breath.   alprazolam 2 MG tablet Commonly known as:  XANAX Take 1-2 mg by mouth 3 (three) times daily as needed for sleep or anxiety.   amphetamine-dextroamphetamine 30 MG tablet Commonly known as:  ADDERALL Take 60 mg by mouth daily.   aspirin 325 MG tablet Take 1 tablet (325 mg total) by mouth daily. For heart health   blood glucose meter kit and supplies Kit Dispense based on patient and insurance preference. Use up to four times daily as directed. (FOR ICD-9 250.00, 250.01).   citalopram 20 MG tablet Commonly known as:  CELEXA Take 1 tablet (20 mg total) by mouth every morning. What changed:  when to take this   cloNIDine 0.1 MG tablet Commonly known as:  CATAPRES Take 2 tablets (0.2 mg total) by mouth 2 (two) times daily.   diphenhydrAMINE 25 MG tablet Commonly known as:  BENADRYL Take 150 mg by mouth once.   divalproex 500 MG DR tablet Commonly known as:  DEPAKOTE Take  500 mg by mouth 2 (two) times daily.   Fluticasone-Salmeterol 250-50 MCG/DOSE Aepb Commonly known as:  ADVAIR Inhale 1 puff into the lungs 2 (two) times daily.   gabapentin 400 MG capsule Commonly known as:  NEURONTIN Take 2 capsules (800 mg total) by mouth 3 (three) times daily.   gabapentin 800 MG tablet Commonly known as:  NEURONTIN Take 800 mg by mouth 3 (three) times daily.   glucose blood test strip Commonly known as:  ACCU-CHEK AVIVA Use as instructed for 3 times daily testing of blood sugar   insulin glargine 100 UNIT/ML injection Commonly known as:  LANTUS Inject 0.2 mLs (20 Units total) into the skin at bedtime.   metFORMIN 1000 MG tablet Commonly known as:  GLUCOPHAGE Take 1 tablet (1,000 mg total) by mouth 2 (two) times daily.   omeprazole 40 MG capsule Commonly known as:  PRILOSEC Take 1 capsule (40 mg total) by mouth 2 (two) times daily.   oxycodone 30 MG immediate release tablet Commonly known as:  ROXICODONE Take 30 mg by mouth 5 (five) times daily as needed for pain.   PRESCRIPTION MEDICATION Inhale into the lungs at bedtime. BIPAP (VPAP)   ranitidine 300 MG tablet Commonly known as:  ZANTAC Take 1 tablet (300 mg total) by mouth at bedtime.   traZODone 50 MG tablet Commonly known as:  DESYREL Take 1 tablet (50 mg total) by mouth at bedtime. For sleep      Follow-up Information    Rexene Edison, NP Follow up on 01/13/2016.   Specialty:  Pulmonary Disease Why:  See [place] at 3:00 pm Contact information: [address] [address] [phone]            Discharged Condition: fair  Time spent on discharge greater than 40 minutes.  Vital signs at Discharge. Temp:  [97.5 F (36.4 C)-98.4 F (36.9 C)] 98 F (36.7 C) (11/07 0900) Pulse Rate:  [55-91] 86 (11/07 1000) Resp:  [14-30] 22 (11/07 1000) BP: (106-150)/(53-87) 131/65 (11/07 1000) SpO2:  [89 %-100 %] 96 % (11/07 1000) Weight:  [353 lb 1.6 oz (160.2 kg)] 353 lb  1.6 oz (160.2 kg) (11/07 0300) Office follow up Special Information or instructions. He is suppose to be on Pap. He has seen Dr. Joya Gaskins in the past.  Signed: Richardson Landry Minor ACNP Maryanna Shape PCCM Pager [phone] till 3 pm If no answer page [phone] 01/06/2016, 10:36 AM  Patient seen and examined, agree with above note.  I dictated the care and orders written for this patient under my direction.  Jaicey Sweaney G Alyah Boehning, MD [phone] 

## 2016-01-13 ENCOUNTER — Inpatient Hospital Stay: Payer: Self-pay | Admitting: Adult Health

## 2016-01-14 ENCOUNTER — Ambulatory Visit (HOSPITAL_BASED_OUTPATIENT_CLINIC_OR_DEPARTMENT_OTHER): Payer: Medicare Other

## 2016-01-19 ENCOUNTER — Telehealth: Payer: Self-pay | Admitting: Internal Medicine

## 2016-01-19 ENCOUNTER — Ambulatory Visit: Payer: Self-pay

## 2016-01-19 ENCOUNTER — Other Ambulatory Visit: Payer: Self-pay | Admitting: Internal Medicine

## 2016-01-19 DIAGNOSIS — G894 Chronic pain syndrome: Secondary | ICD-10-CM

## 2016-01-19 MED ORDER — DIVALPROEX SODIUM 500 MG PO DR TAB: 500.0000 mg | DELAYED_RELEASE_TABLET | Freq: Two times a day (BID) | ORAL | 0 refills | Status: DC

## 2016-01-19 MED FILL — VENTOLIN HFA 90 MCG INHALER: 108 (90 BAS | 28 days supply | Qty: 18 | Fill #1

## 2016-01-19 MED FILL — cloNIDine HCL 0.1 MG TABS: 0.1 | 22 days supply | Qty: 90 | Fill #2

## 2016-01-19 MED FILL — CITALOPRAM HBR 20 MG TABLET: 20 | 30 days supply | Qty: 30 | Fill #2

## 2016-01-19 MED FILL — LANTUS 100 UNITS/ML VIAL: 100 | 50 days supply | Qty: 10 | Fill #1

## 2016-01-19 MED FILL — ?METFORMIN HCL 1,000 MG TAB: 1000 | 30 days supply | Qty: 60 | Fill #1

## 2016-01-19 MED FILL — GABAPENTIN 400 MG CAPSULE: 400 | 30 days supply | Qty: 180 | Fill #2

## 2016-01-19 MED FILL — traZODone HCL 50 MG TABS: 50 | 30 days supply | Qty: 30 | Fill #2

## 2016-01-19 MED FILL — raNITIdine HCL 150 MG TABS: 150 | 30 days supply | Qty: 60 | Fill #2

## 2016-01-19 MED FILL — DIVALPROEX SOD DR 500 MG TA: 500 | 30 days supply | Qty: 60 | Fill #0

## 2016-01-19 MED FILL — OMEPRAZOLE DR 40 MG CAPSULE: 40 | 30 days supply | Qty: 60 | Fill #1

## 2016-01-19 NOTE — Telephone Encounter (Signed)
Patient called requesting refills on medication:  divalproex (DEPAKOTE) 500 MG      [facility]               Pharmacy:   Please follow up with patient.

## 2016-01-19 NOTE — Telephone Encounter (Signed)
Divalproex refilled x 30 days - patient due for office visit

## 2016-01-19 NOTE — Telephone Encounter (Signed)
Patient called requesting Rx:   Tylenol #3                        °Please call patient when Rx ready for pick up. ° ° °

## 2016-01-20 ENCOUNTER — Ambulatory Visit: Payer: Self-pay

## 2016-01-27 MED FILL — ACETAMINOPHEN/COD #3 TABLET: 300-30 | 15 days supply | Qty: 90 | Fill #0

## 2016-02-05 ENCOUNTER — Ambulatory Visit: Payer: Self-pay | Admitting: Internal Medicine

## 2016-02-23 IMAGING — CR DG CHEST 2V
3 series · 3 of 3 positions shown · non-contrast
Comparison: Prior radiograph from 02/21/2014

CLINICAL DATA: Initial evaluation for acute shortness of breath

EXAM:
CHEST  2 VIEW

[w chest lat]
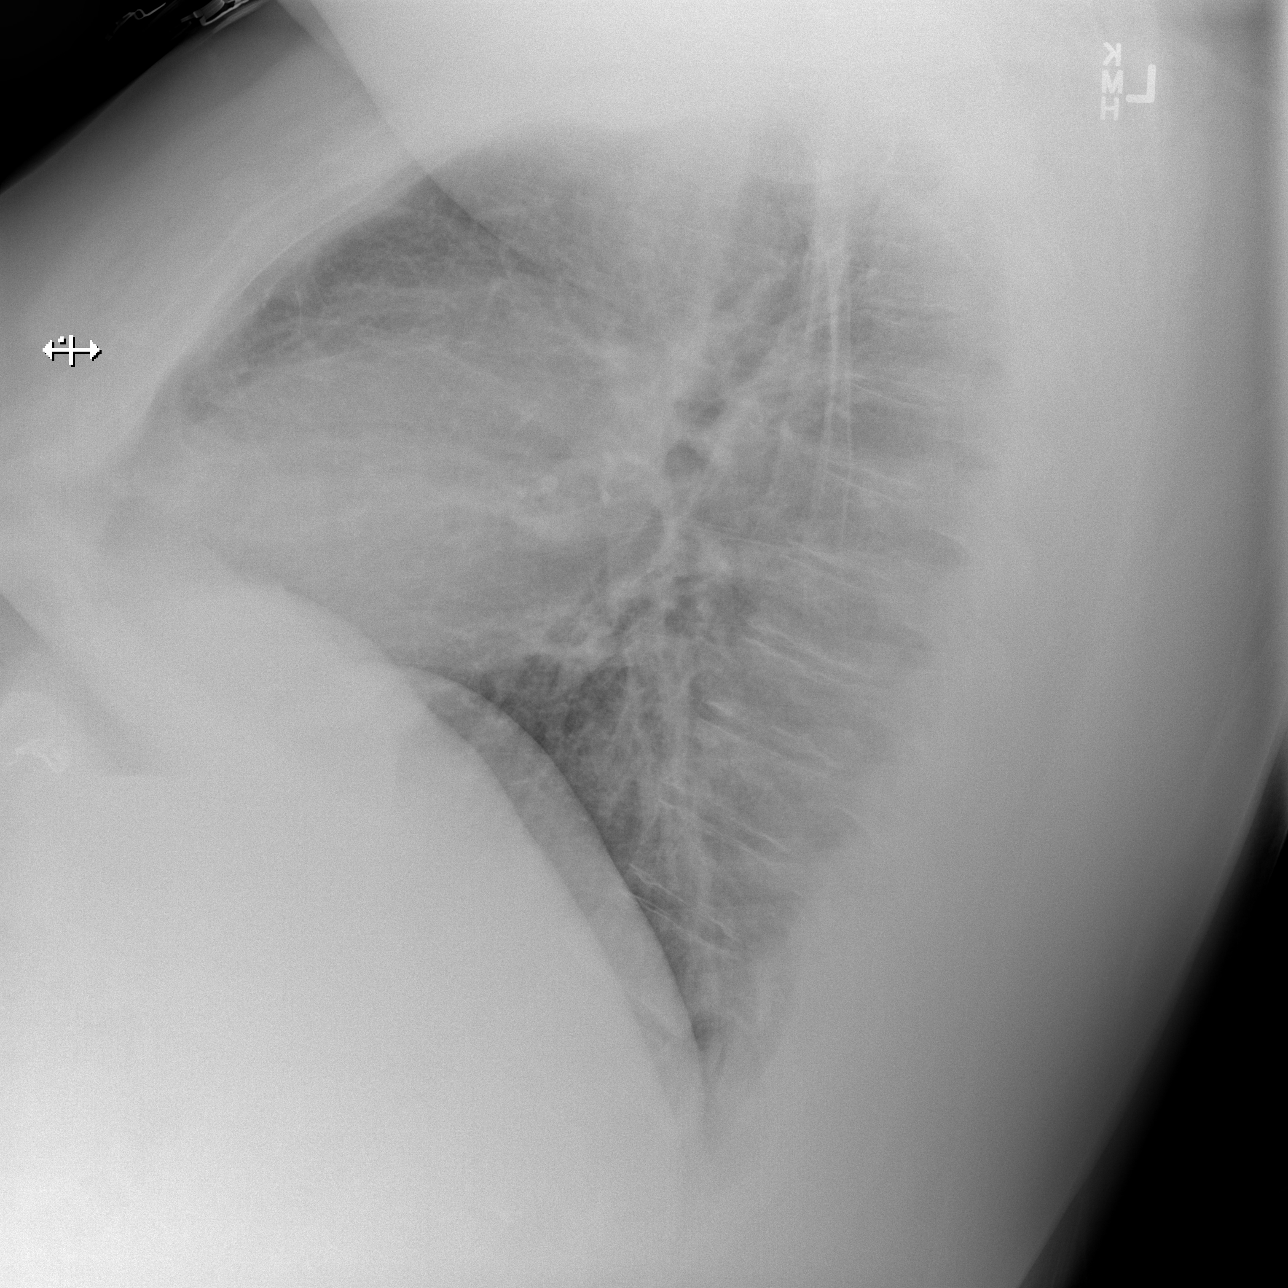

[x chest ap (1 of 2)]
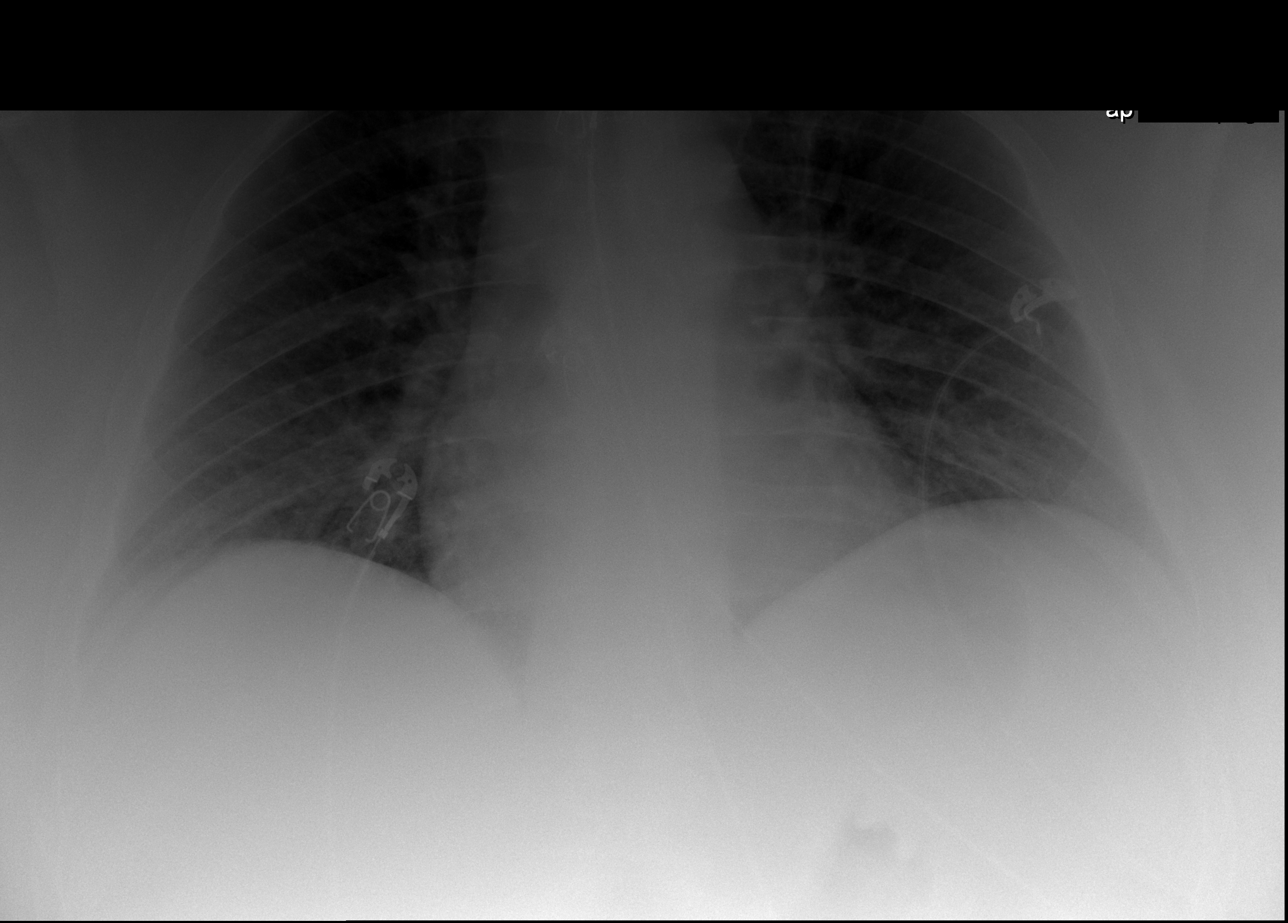

[x chest ap (2 of 2)]
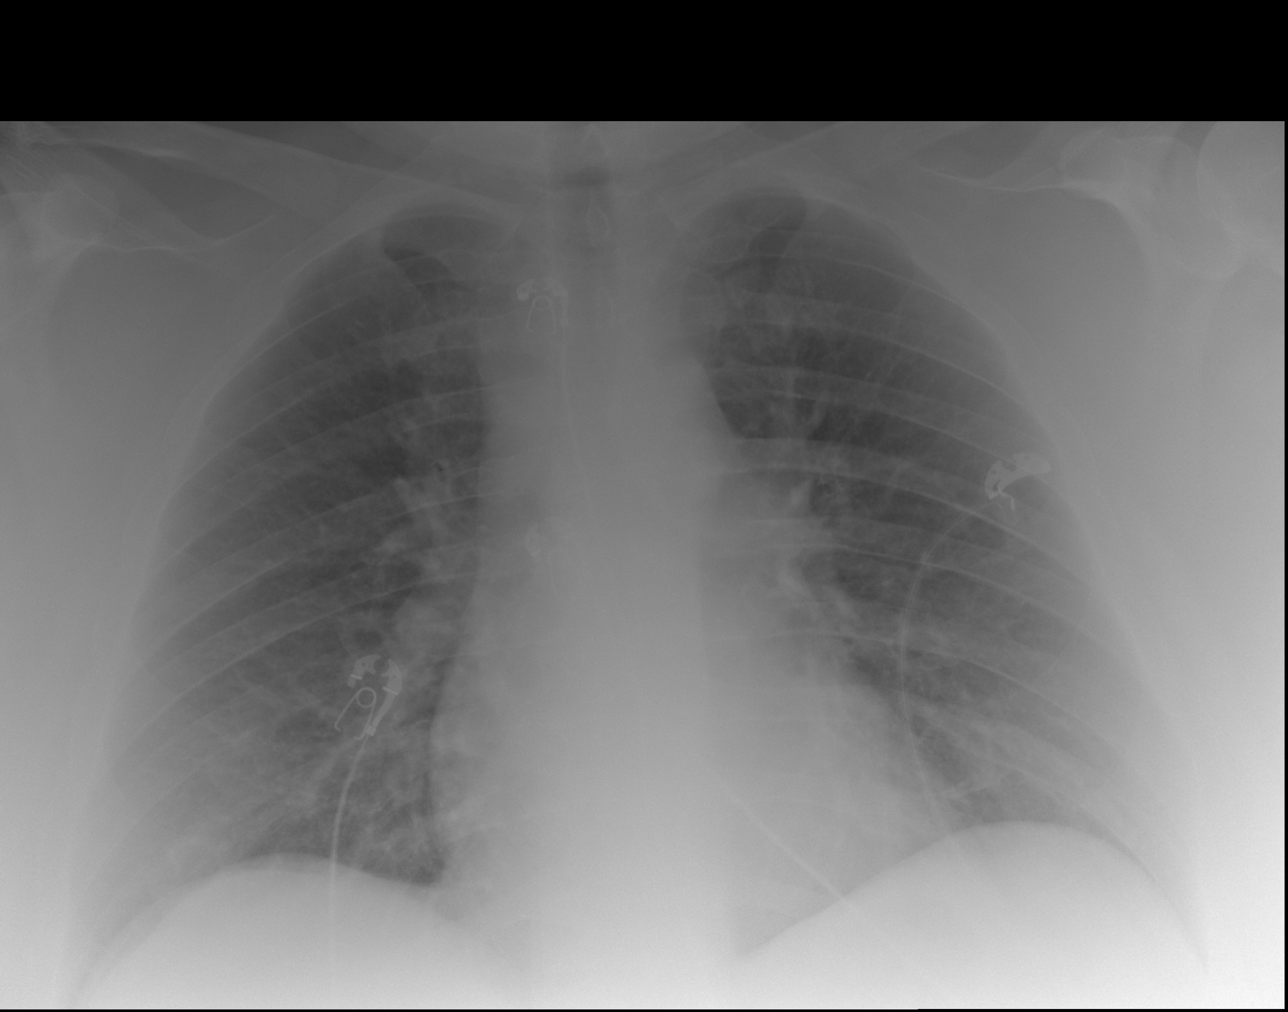

[3 of 3 positions shown; findings below may reference images not displayed]

FINDINGS: Cardiomegaly is stable from prior. Mediastinal silhouette within
normal limits.

Lungs are hypoinflated. Mild perihilar vascular congestion. This may
in part be related to shallow lung inflation. No overt pulmonary
edema. No pleural effusion. No pneumothorax. No focal infiltrates
identified.

No acute osseous abnormality.
IMPRESSION: 1. Stable cardiomegaly with mild perihilar vascular congestion
without overt pulmonary edema. Please note that this finding may in
part be related to shallow degree of lung inflation on this exam.
2. No other active cardiopulmonary disease.

## 2016-03-03 ENCOUNTER — Other Ambulatory Visit: Payer: Self-pay | Admitting: Internal Medicine

## 2016-03-03 ENCOUNTER — Other Ambulatory Visit: Payer: Self-pay | Admitting: Family Medicine

## 2016-03-03 ENCOUNTER — Telehealth: Payer: Self-pay | Admitting: Internal Medicine

## 2016-03-03 NOTE — Telephone Encounter (Signed)
Pt. Came into facility requesting a refill on Tylenol # 3.  °Please f/u  °

## 2016-03-04 ENCOUNTER — Other Ambulatory Visit: Payer: Self-pay | Admitting: Internal Medicine

## 2016-03-04 DIAGNOSIS — G894 Chronic pain syndrome: Secondary | ICD-10-CM

## 2016-03-04 MED ORDER — ACETAMINOPHEN-CODEINE #3 300-30 MG PO TABS: 1.0000 | ORAL_TABLET | ORAL | 0 refills | Status: DC | PRN

## 2016-03-06 IMAGING — CR DG CHEST 1V PORT
1 series · 1 of 1 positions shown · non-contrast
Comparison: 05/26/2014

CLINICAL DATA: Fell down stairs. Deep laceration to medial side of
hand below the pinky. Obese. Diabetes.

EXAM:
PORTABLE CHEST - 1 VIEW

[AP]
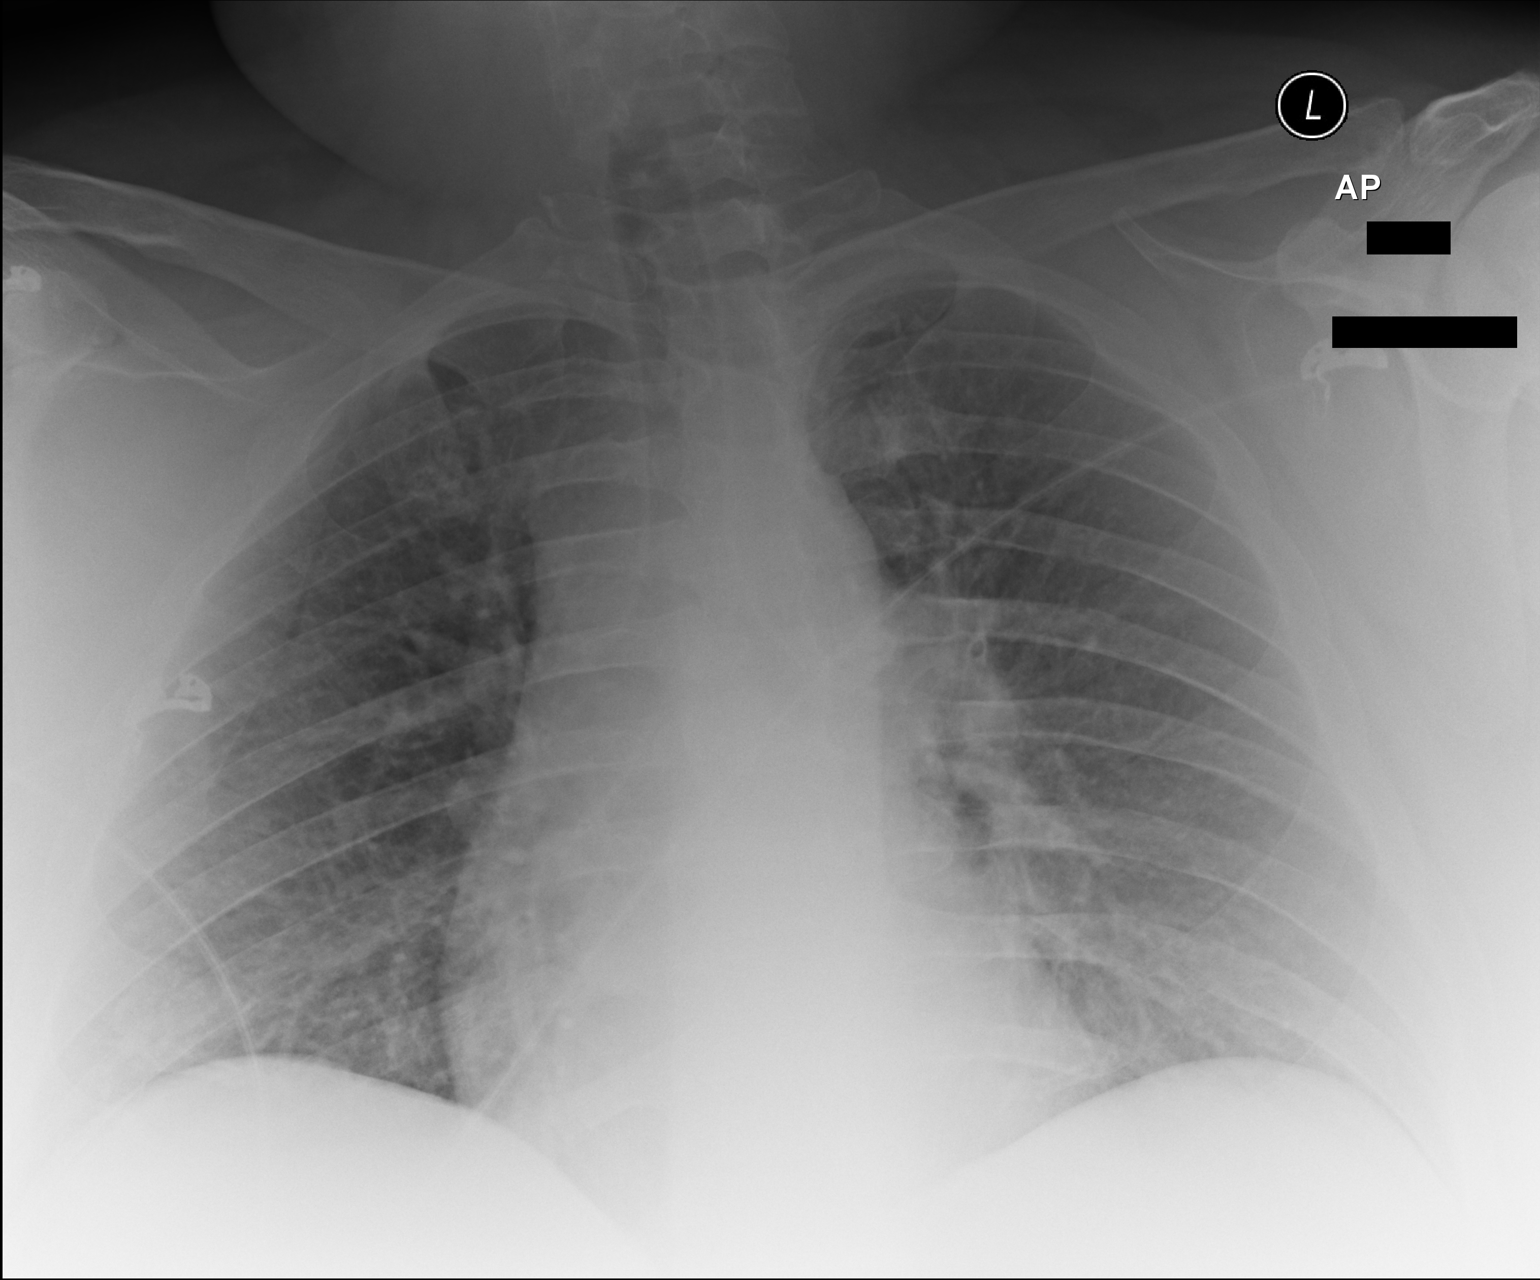

[1 of 1 positions shown; findings below may reference images not displayed]

FINDINGS: Shallow inspiration. Borderline heart size with normal pulmonary
vascularity. No focal airspace disease or consolidation in the
lungs. No blunting of costophrenic angles. No pneumothorax.
Mediastinal contours appear intact.
IMPRESSION: No active disease.

## 2016-03-06 IMAGING — CT CT HEAD W/O CM
2 of 4 series · 12 of 47 positions shown, 15 images · non-contrast
Comparison: 02/21/2014

CLINICAL DATA: , back pain post fall down stairs

EXAM:
CT HEAD WITHOUT CONTRAST
CT CERVICAL SPINE WITHOUT CONTRAST
TECHNIQUE: Multidetector CT imaging of the head and cervical spine was
performed following the standard protocol without intravenous
contrast. Multiplanar CT image reconstructions of the cervical spine
were also generated.

[Series 5: coronals · coronal · 0.33mm/px · 3 of 46 slices shown]
[im 16/46  brain]
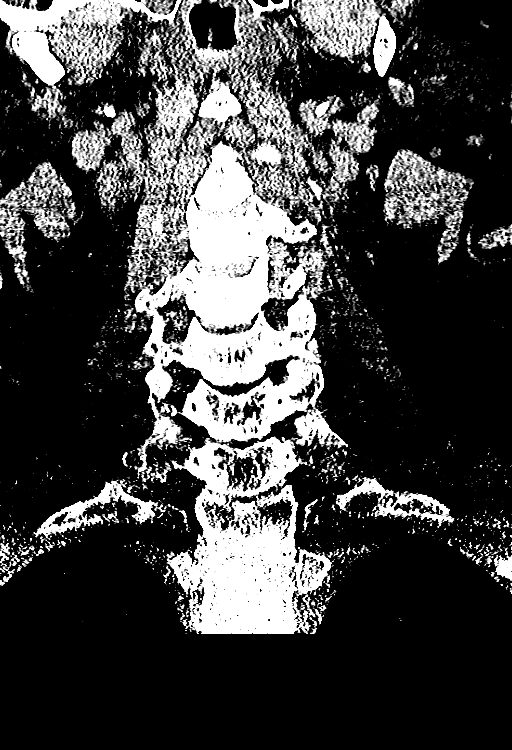
[im 21/46  brain]
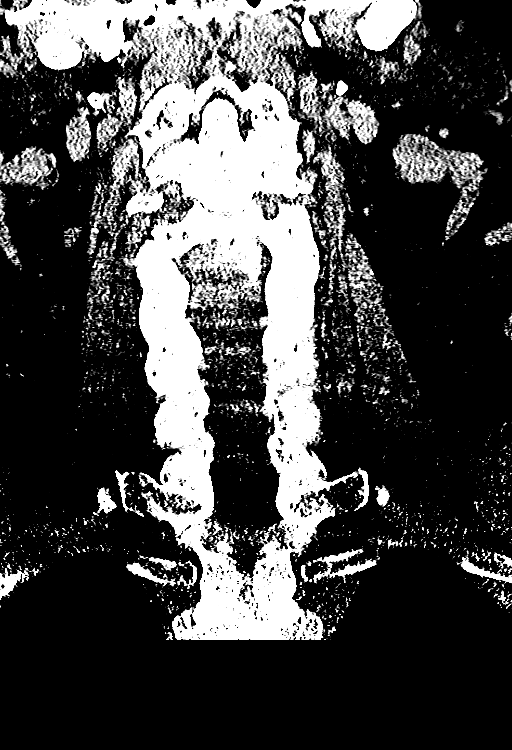
[im 26/46  brain]
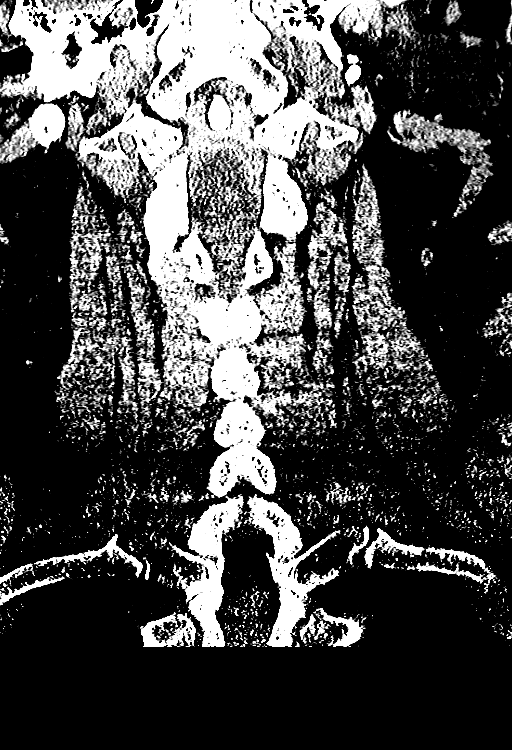

[Series 7: orthogonals · axial · 0.33mm/px · z∈[-356,-155]mm · 9 of 121 slices shown, 12 images]
[im 9/121  brain]
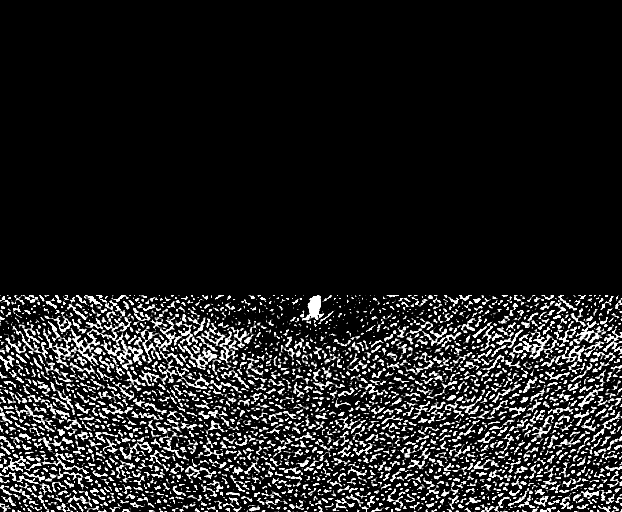
[im 9/121  bone]
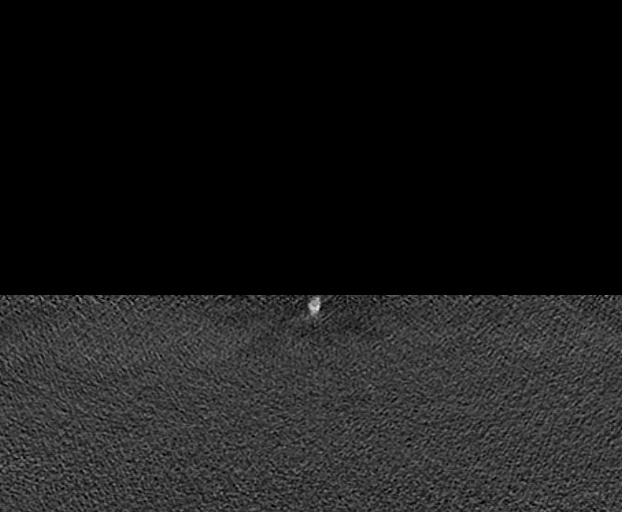
[im 25/121  brain]
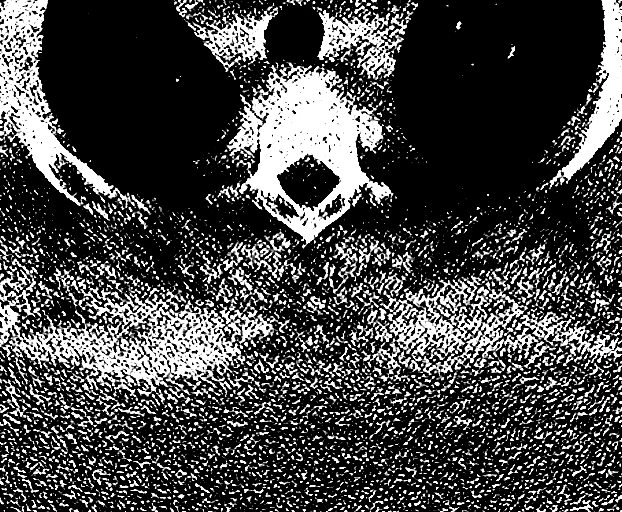
[im 33/121  brain]
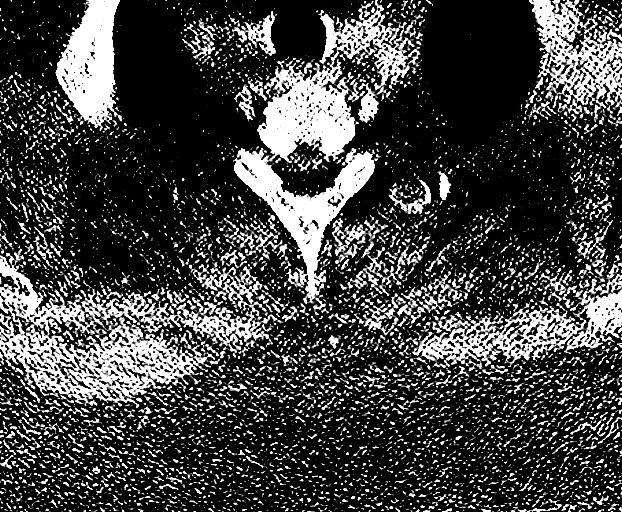
[im 49/121  brain]
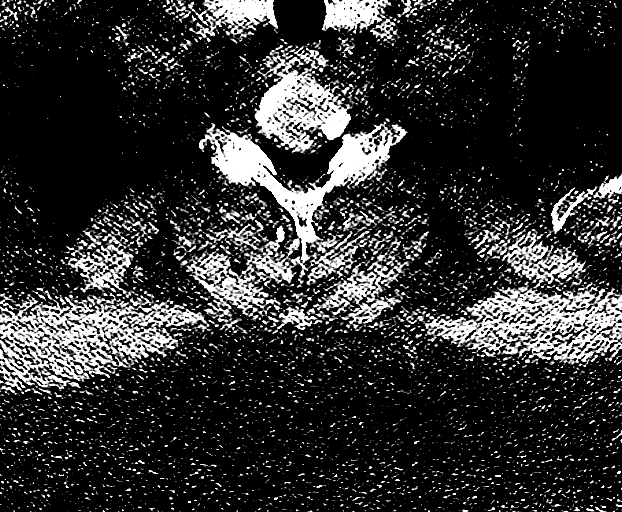
[im 65/121  brain]
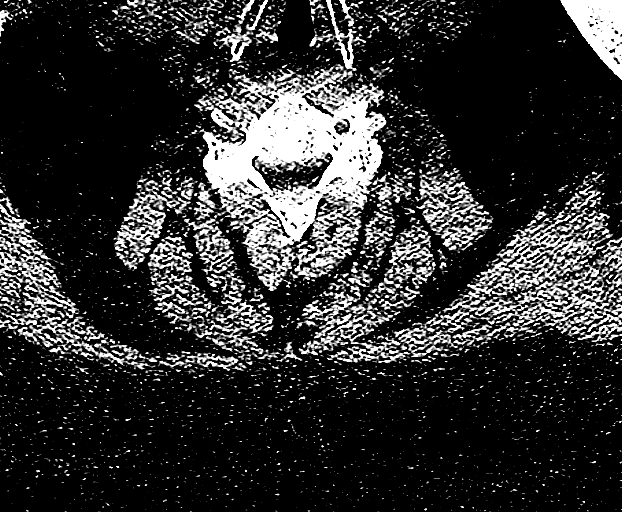
[im 65/121  bone]
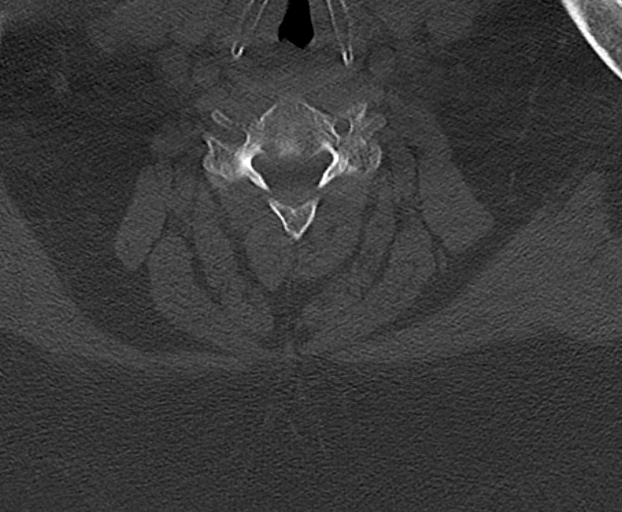
[im 73/121  brain]
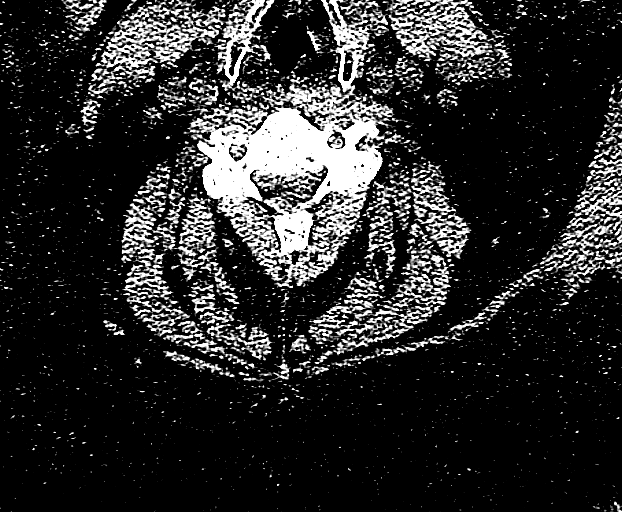
[im 89/121  brain]
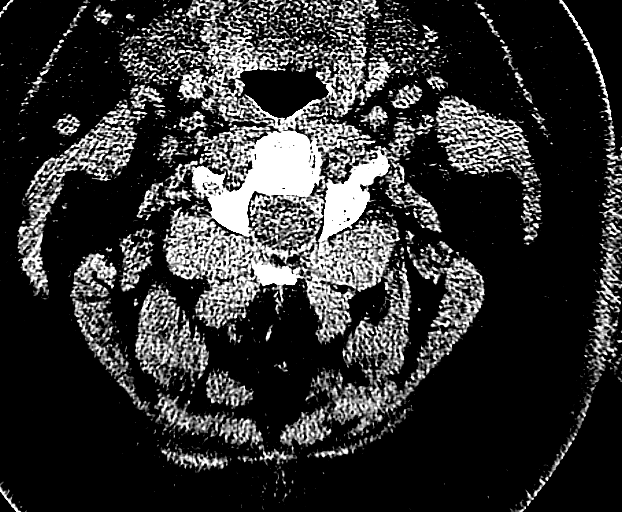
[im 97/121  brain]
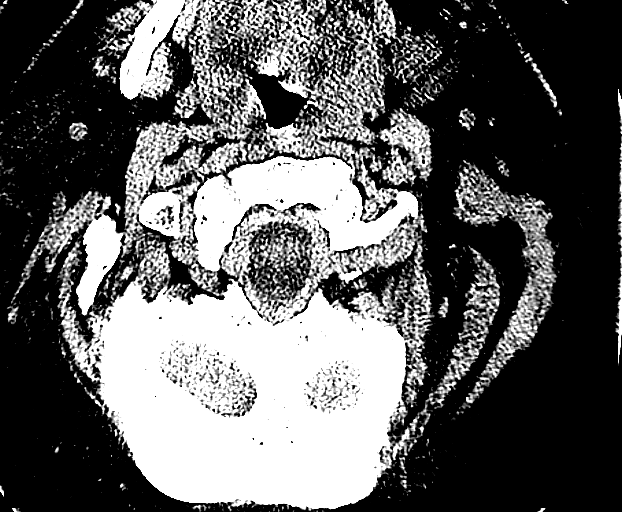
[im 113/121  brain]
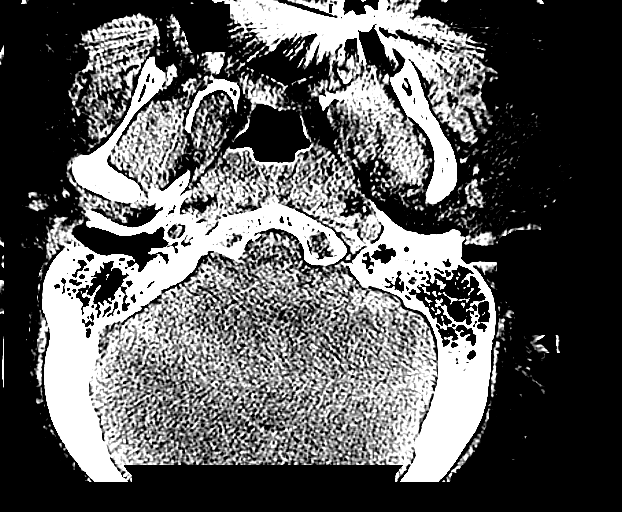
[im 113/121  bone]
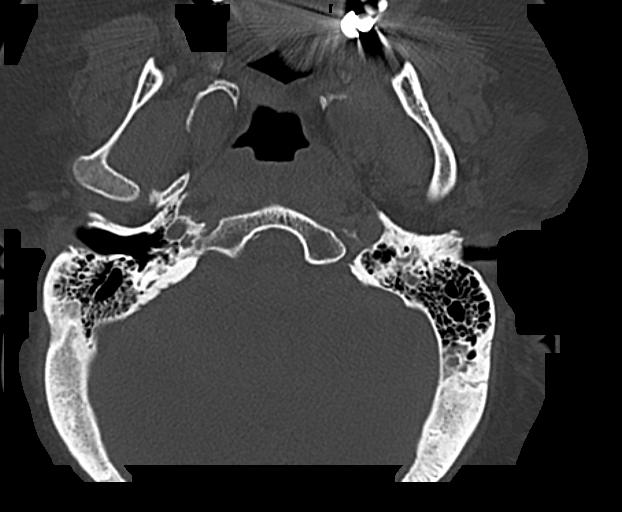

[12 of 47 positions shown; findings below may reference images not displayed]

FINDINGS: CT HEAD FINDINGS

Mild mucosal thickening bilateral ethmoid air cells. The mastoid air
cells are unremarkable. No skull fracture is noted.

No intracranial hemorrhage, mass effect or midline shift.

No acute infarction. Ventricular size is stable from prior exam. No
mass lesion is noted on this unenhanced scan.

CT CERVICAL SPINE FINDINGS

Axial images of the cervical spine shows no acute fracture or
subluxation. There are streaky artifacts from patient's large body
habitus.

Computer processed images shows no acute fracture or subluxation.
Mild degenerative changes C1-C2 articulation. No pneumothorax is
noted in visualized lung apices. Alignment and vertebral body
heights are preserved.
IMPRESSION: 1. No acute intracranial abnormality.
2. No cervical spine acute fracture or subluxation. Mild
degenerative changes C1-C2 articulation.

## 2016-03-06 IMAGING — CT CT ABD-PELV W/ CM
2 of 6 series · 14 of 36 positions shown, 17 images · IV contrast (Omni 300)
Comparison: CT abdomen and pelvis 02/21/2014.  CT chest 09/02/2013.

CLINICAL DATA: Patient fell down a flight of stairs. Tender over
the entire back and abdomen. Pain in the right shoulder, right rib
cage, and right hand.

EXAM:
CT CHEST, ABDOMEN, AND PELVIS WITH CONTRAST
TECHNIQUE: Multidetector CT imaging of the chest, abdomen and pelvis was
performed following the standard protocol during bolus
administration of intravenous contrast.
CONTRAST:  100 mL Omnipaque 300

[Series 4: thins · axial · 0.98mm/px · z∈[-942,-270]mm · 11 of 930 slices shown, 14 images]
[im 45/930  mediastinal]
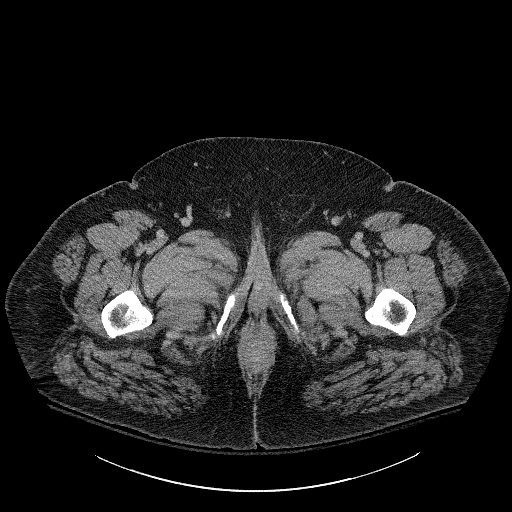
[im 45/930  lung]
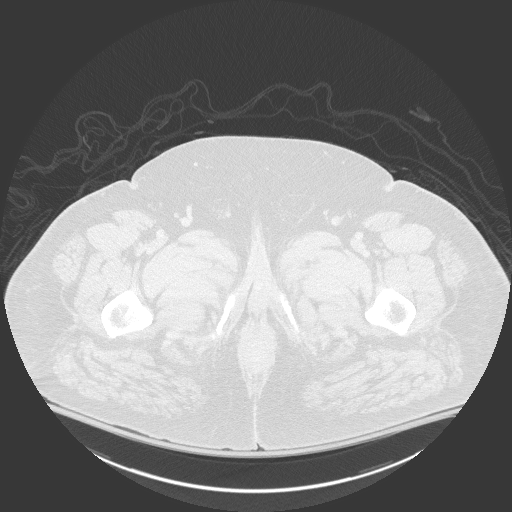
[im 133/930  lung]
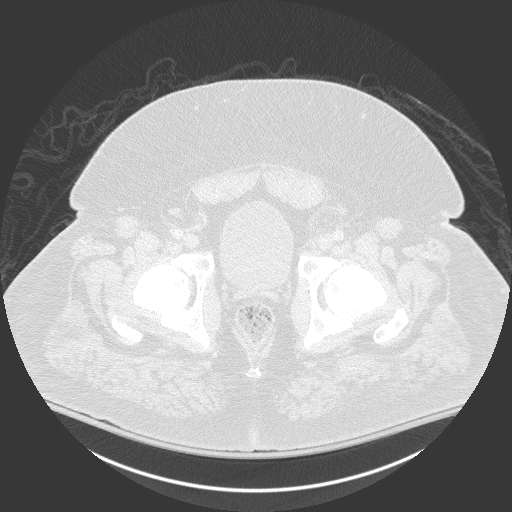
[im 222/930  lung]
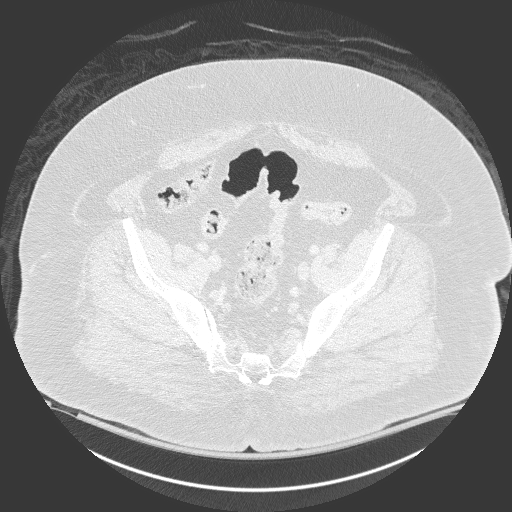
[im 310/930  lung]
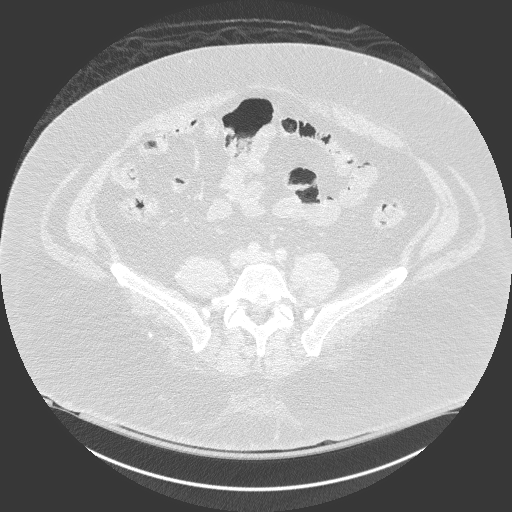
[im 399/930  mediastinal]
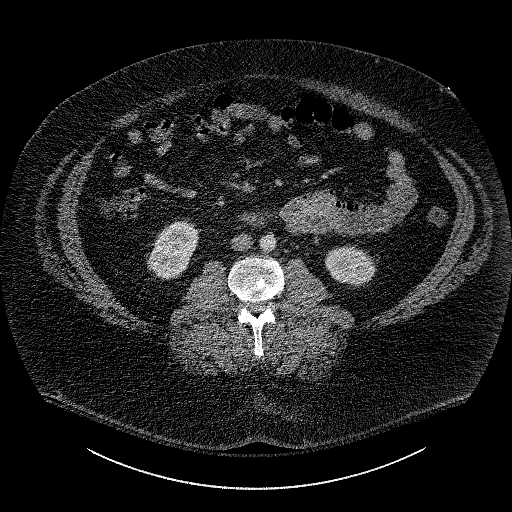
[im 399/930  lung]
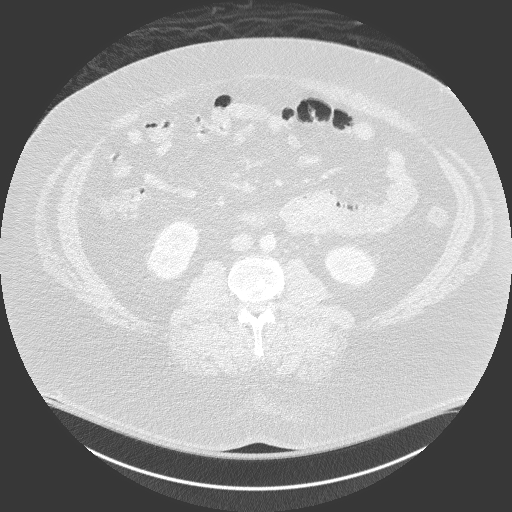
[im 487/930  lung]
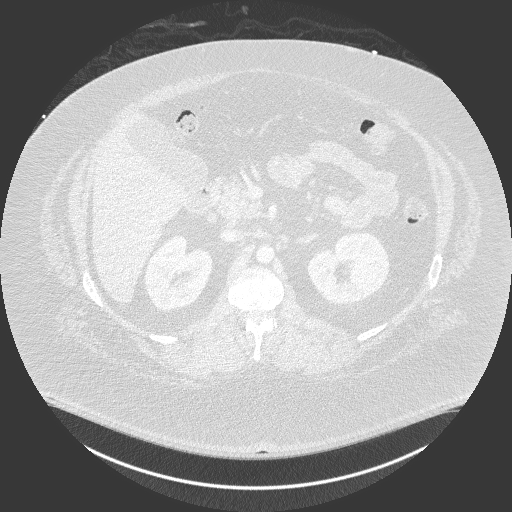
[im 531/930  lung]
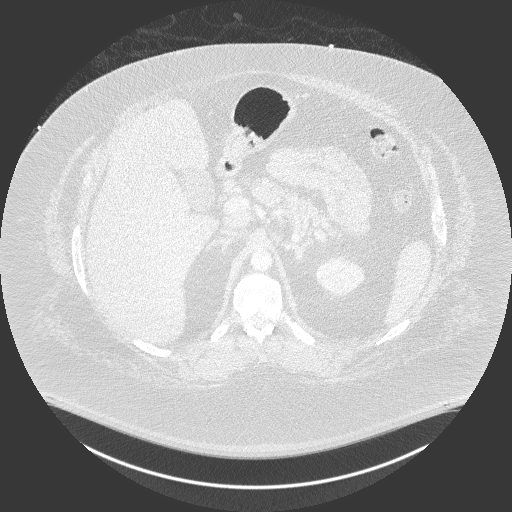
[im 620/930  lung]
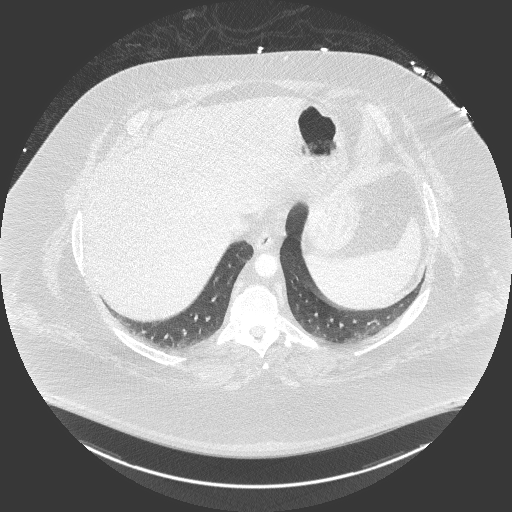
[im 708/930  mediastinal]
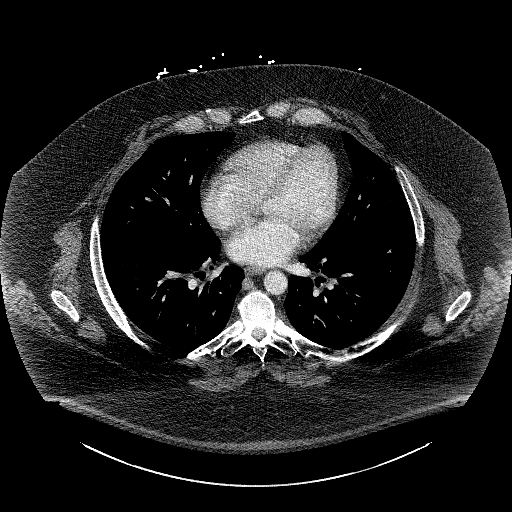
[im 708/930  lung]
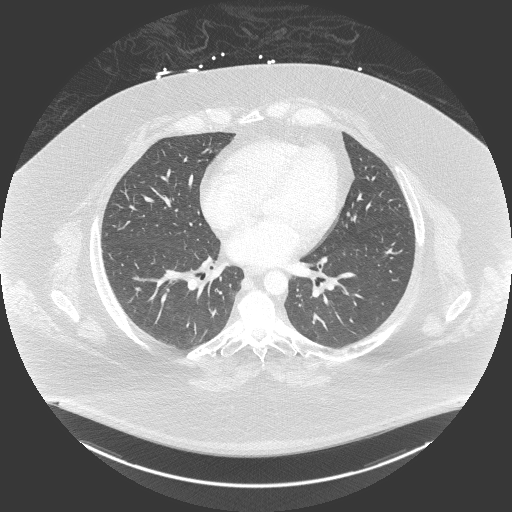
[im 797/930  lung]
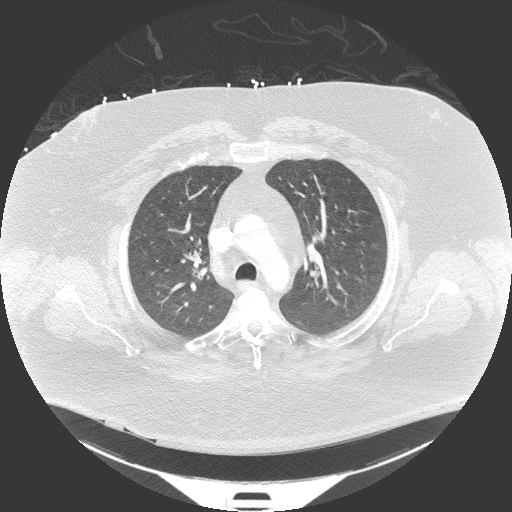
[im 885/930  lung]
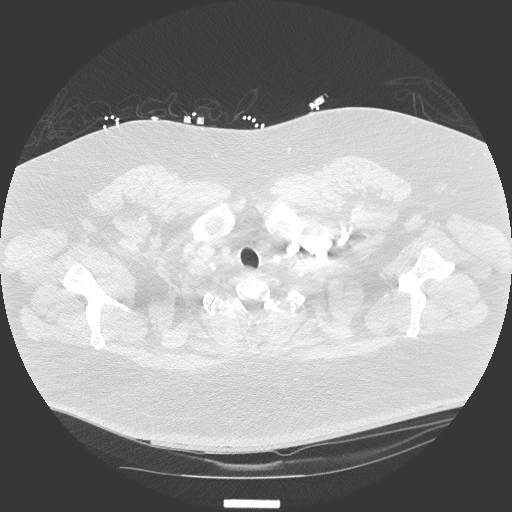

[Series 5: coronal · coronal · 1.03mm/px · 3 of 138 slices shown]
[im 28/138  lung]
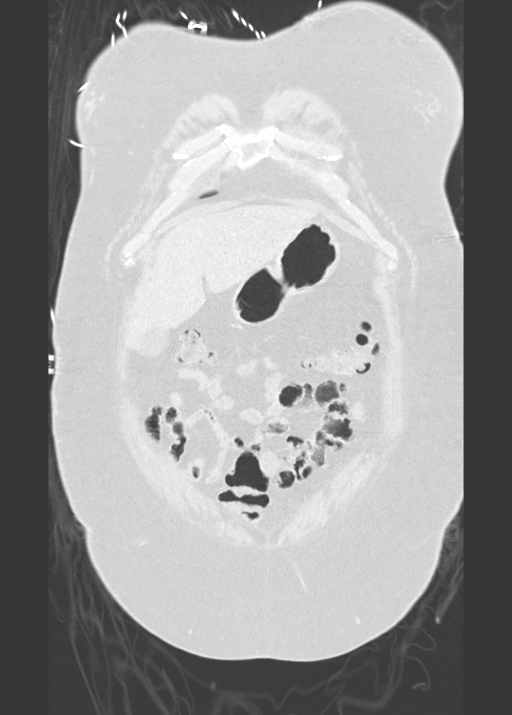
[im 55/138  lung]
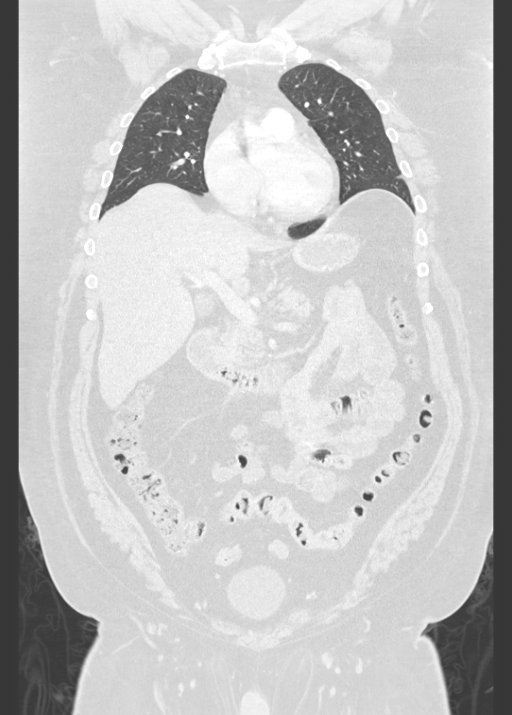
[im 83/138  lung]
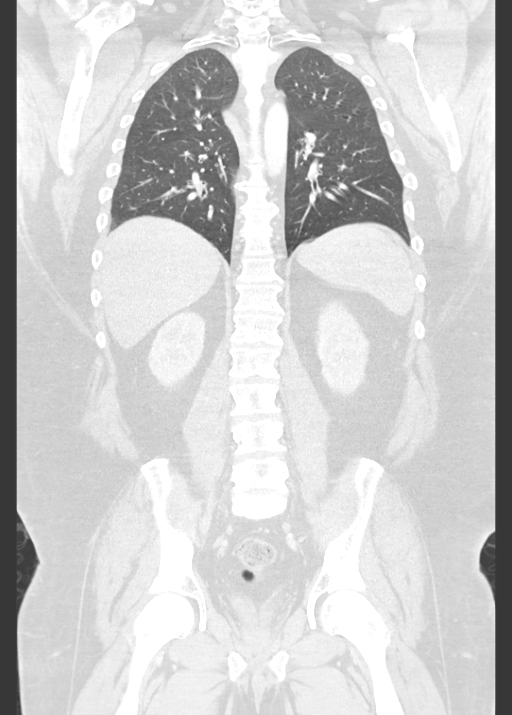

[14 of 36 positions shown; findings below may reference images not displayed]

FINDINGS: CT CHEST FINDINGS

Examination is technically limited due to patient's body habitus and
respiratory motion artifact. Normal heart size. Normal caliber
thoracic aorta. No abnormal mediastinal gas or fluid collections.
Esophagus is decompressed. No significant lymphadenopathy in the
chest.

No focal airspace disease or consolidation in the lungs. Mild
atelectasis in the lung bases. Several scattered subpleural nodules
in the right lung, measuring up to about 5 mm diameter. These appear
unchanged since previous study. Nodules or also present on a prior
CT from 10/11/2009 without change, suggesting benign nodules. No
pneumothorax. No pleural effusions.

CT ABDOMEN AND PELVIS FINDINGS

The liver, spleen, gallbladder, pancreas, adrenal glands, kidneys,
abdominal aorta, inferior vena cava, and retroperitoneal lymph nodes
are unremarkable. There are mildly prominent lymph nodes
demonstrated in the porta hepatis and celiac axis, measuring up to
about 13 mm short axis dimension. These are unchanged since previous
study. Stomach, small bowel, and colon are decompressed. No free air
or free fluid in the abdomen. No abnormal mesenteric or
retroperitoneal fluid collections.

Pelvis: Prostate gland is not enlarged. Bladder wall is not
thickened. No free or loculated pelvic fluid collections. No pelvic
mass or lymphadenopathy. Appendix is not identified.

Bones: Normal alignment of the thoracic and lumbar spine. Multiple
Schmorl's nodes. Degenerative changes with mild disc space narrowing
and endplate hypertrophic changes. No vertebral compression
deformities. Posterior elements appear intact. Sternum is
nondepressed. No displaced rib fractures identified. Sacrum, pelvis,
and hips appear intact.
IMPRESSION: No acute posttraumatic changes demonstrated in the chest, abdomen,
or pelvis. No evidence of mediastinal or pulmonary parenchymal
injury. No evidence of solid organ injury or bowel perforation.
Multiple pulmonary nodules appear stable over long-term consistent
with benign nodules. Visualized bones appear intact.

## 2016-03-06 IMAGING — CT CT HEAD W/O CM
1 of 2 series · 15 of 30 positions shown, 19 images · non-contrast
Comparison: 02/21/2014

CLINICAL DATA: , back pain post fall down stairs

EXAM:
CT HEAD WITHOUT CONTRAST
CT CERVICAL SPINE WITHOUT CONTRAST
TECHNIQUE: Multidetector CT imaging of the head and cervical spine was
performed following the standard protocol without intravenous
contrast. Multiplanar CT image reconstructions of the cervical spine
were also generated.

[Series 3: head 2.0 h70h · axial · 0.48mm/px · z∈[-135,+15]mm · 15 of 85 slices shown, 19 images]
[im 5/85  brain]
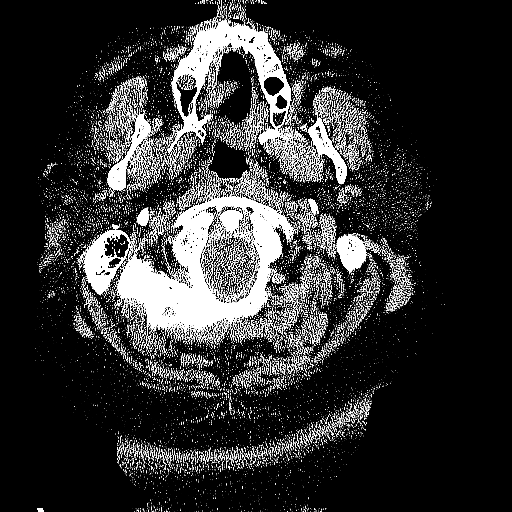
[im 5/85  bone]
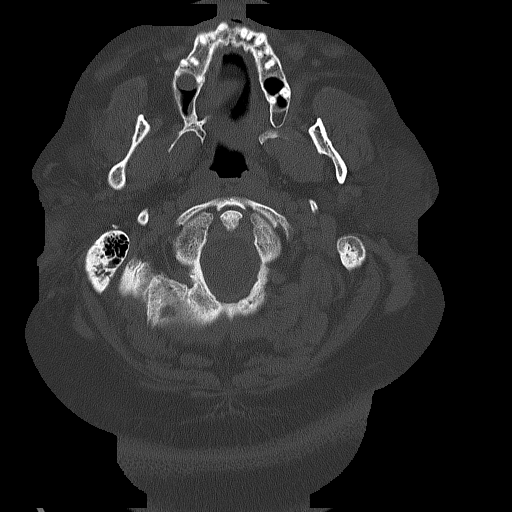
[im 9/85  brain]
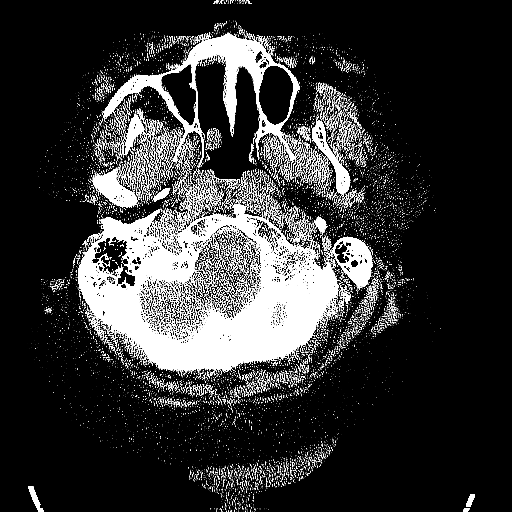
[im 17/85  brain]
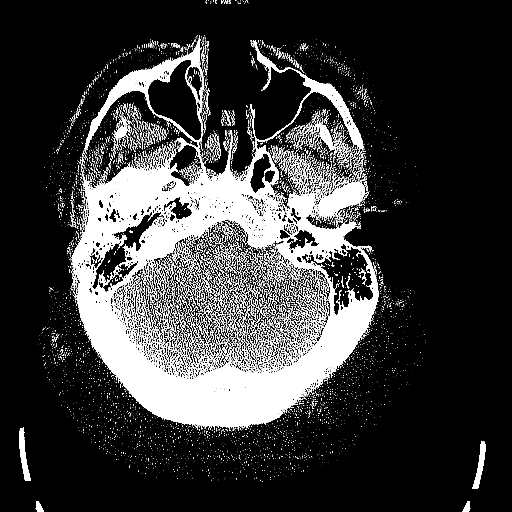
[im 22/85  brain]
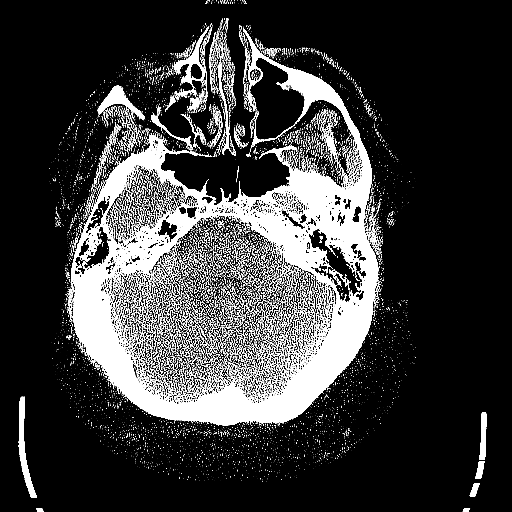
[im 26/85  brain]
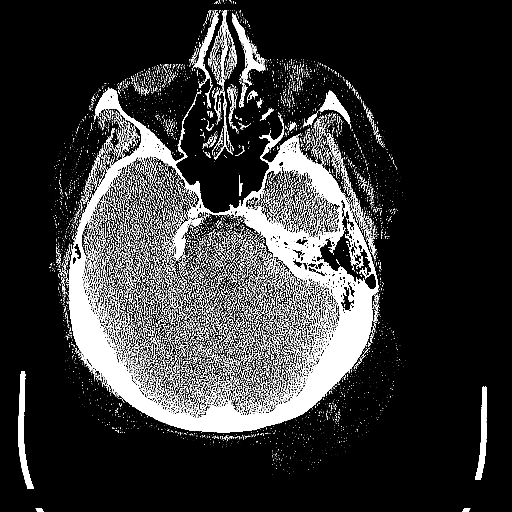
[im 26/85  bone]
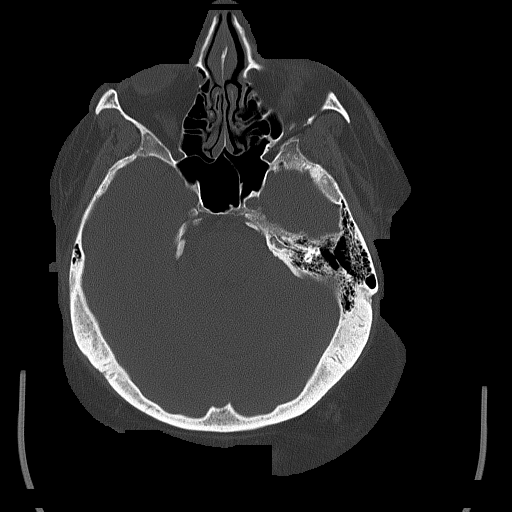
[im 30/85  brain]
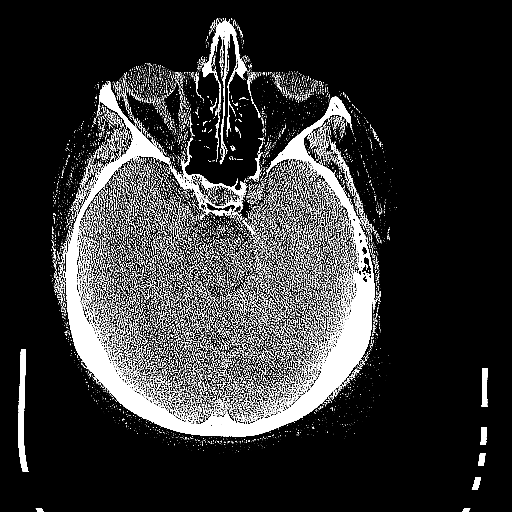
[im 38/85  brain]
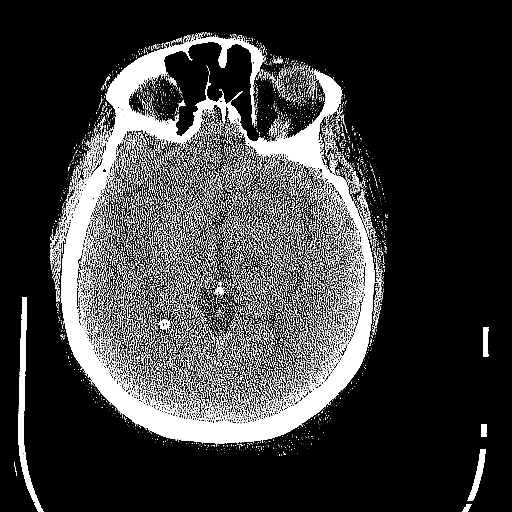
[im 43/85  brain]
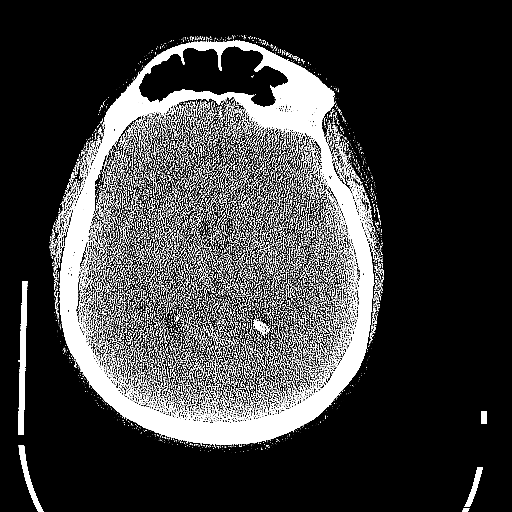
[im 47/85  brain]
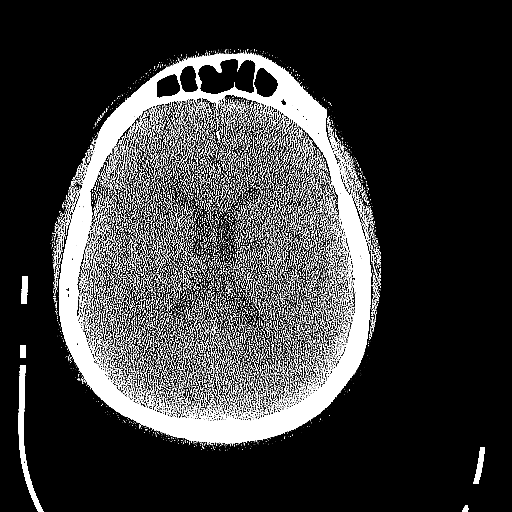
[im 47/85  bone]
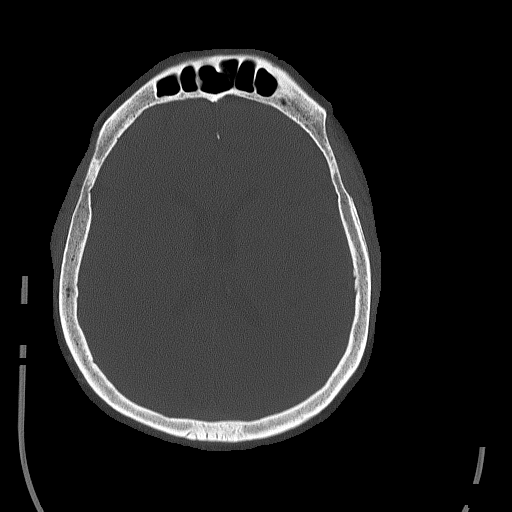
[im 55/85  brain]
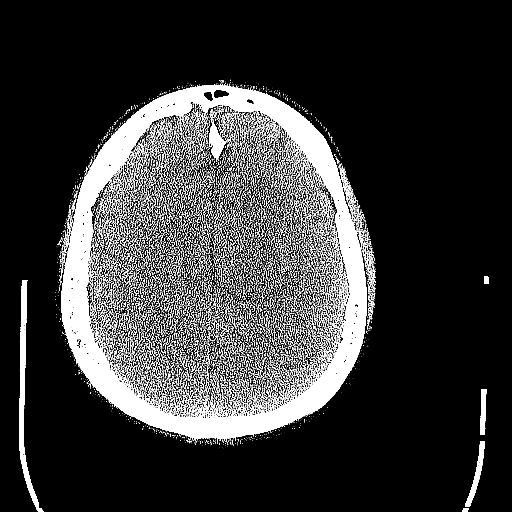
[im 59/85  brain]
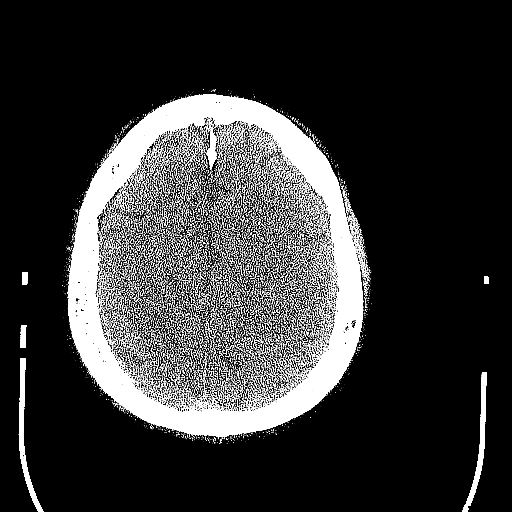
[im 64/85  brain]
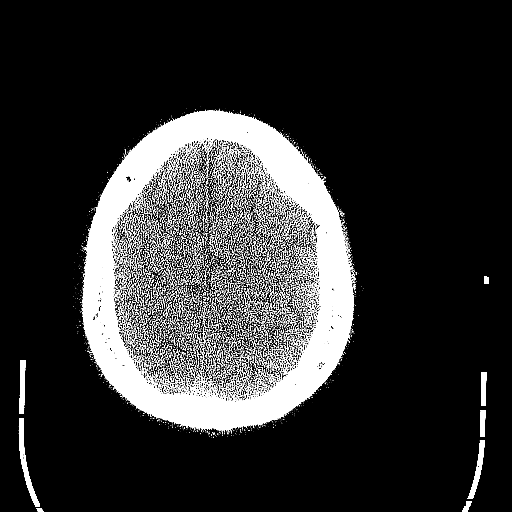
[im 68/85  brain]
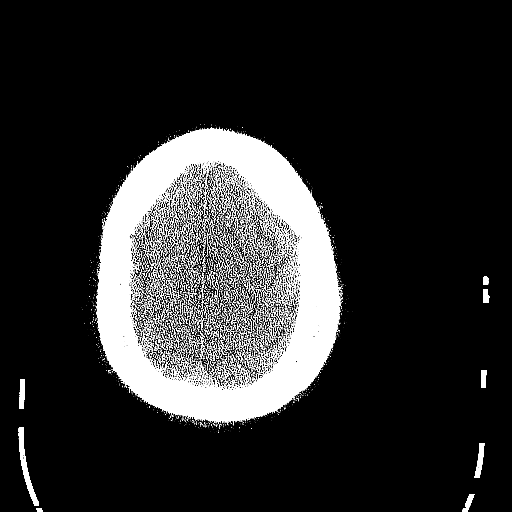
[im 68/85  bone]
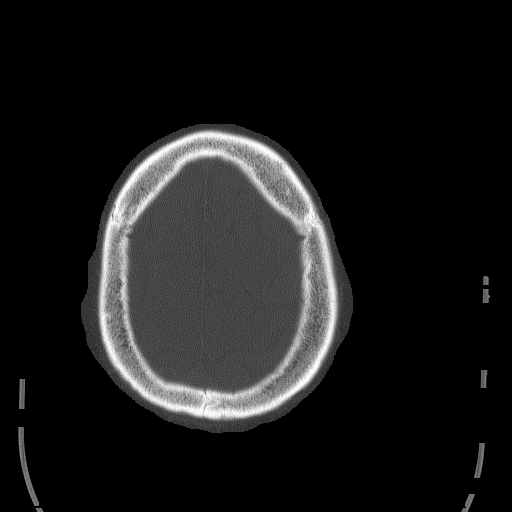
[im 76/85  brain]
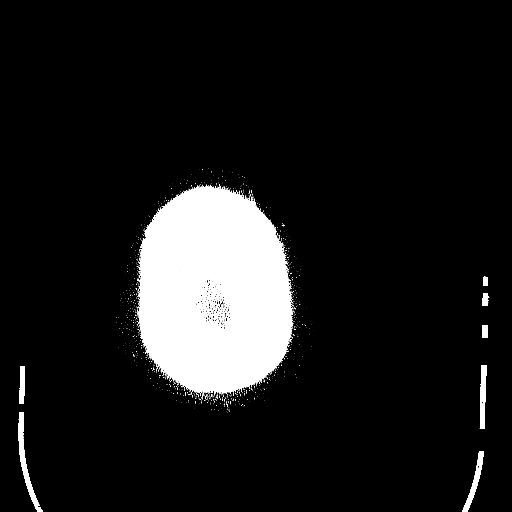
[im 80/85  brain]
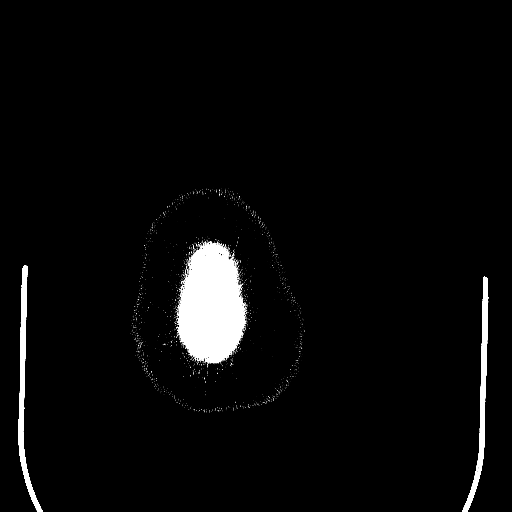

[15 of 30 positions shown; findings below may reference images not displayed]

FINDINGS: CT HEAD FINDINGS

Mild mucosal thickening bilateral ethmoid air cells. The mastoid air
cells are unremarkable. No skull fracture is noted.

No intracranial hemorrhage, mass effect or midline shift.

No acute infarction. Ventricular size is stable from prior exam. No
mass lesion is noted on this unenhanced scan.

CT CERVICAL SPINE FINDINGS

Axial images of the cervical spine shows no acute fracture or
subluxation. There are streaky artifacts from patient's large body
habitus.

Computer processed images shows no acute fracture or subluxation.
Mild degenerative changes C1-C2 articulation. No pneumothorax is
noted in visualized lung apices. Alignment and vertebral body
heights are preserved.
IMPRESSION: 1. No acute intracranial abnormality.
2. No cervical spine acute fracture or subluxation. Mild
degenerative changes C1-C2 articulation.

## 2016-03-06 IMAGING — CR DG PORTABLE PELVIS
2 series · 2 of 2 positions shown · non-contrast
Comparison: 02/21/2014 CT

CLINICAL DATA: Fall down stairs with pelvic pain. Initial
encounter.

EXAM:
PORTABLE PELVIS 1-2 VIEWS

[AP (1 of 2)]
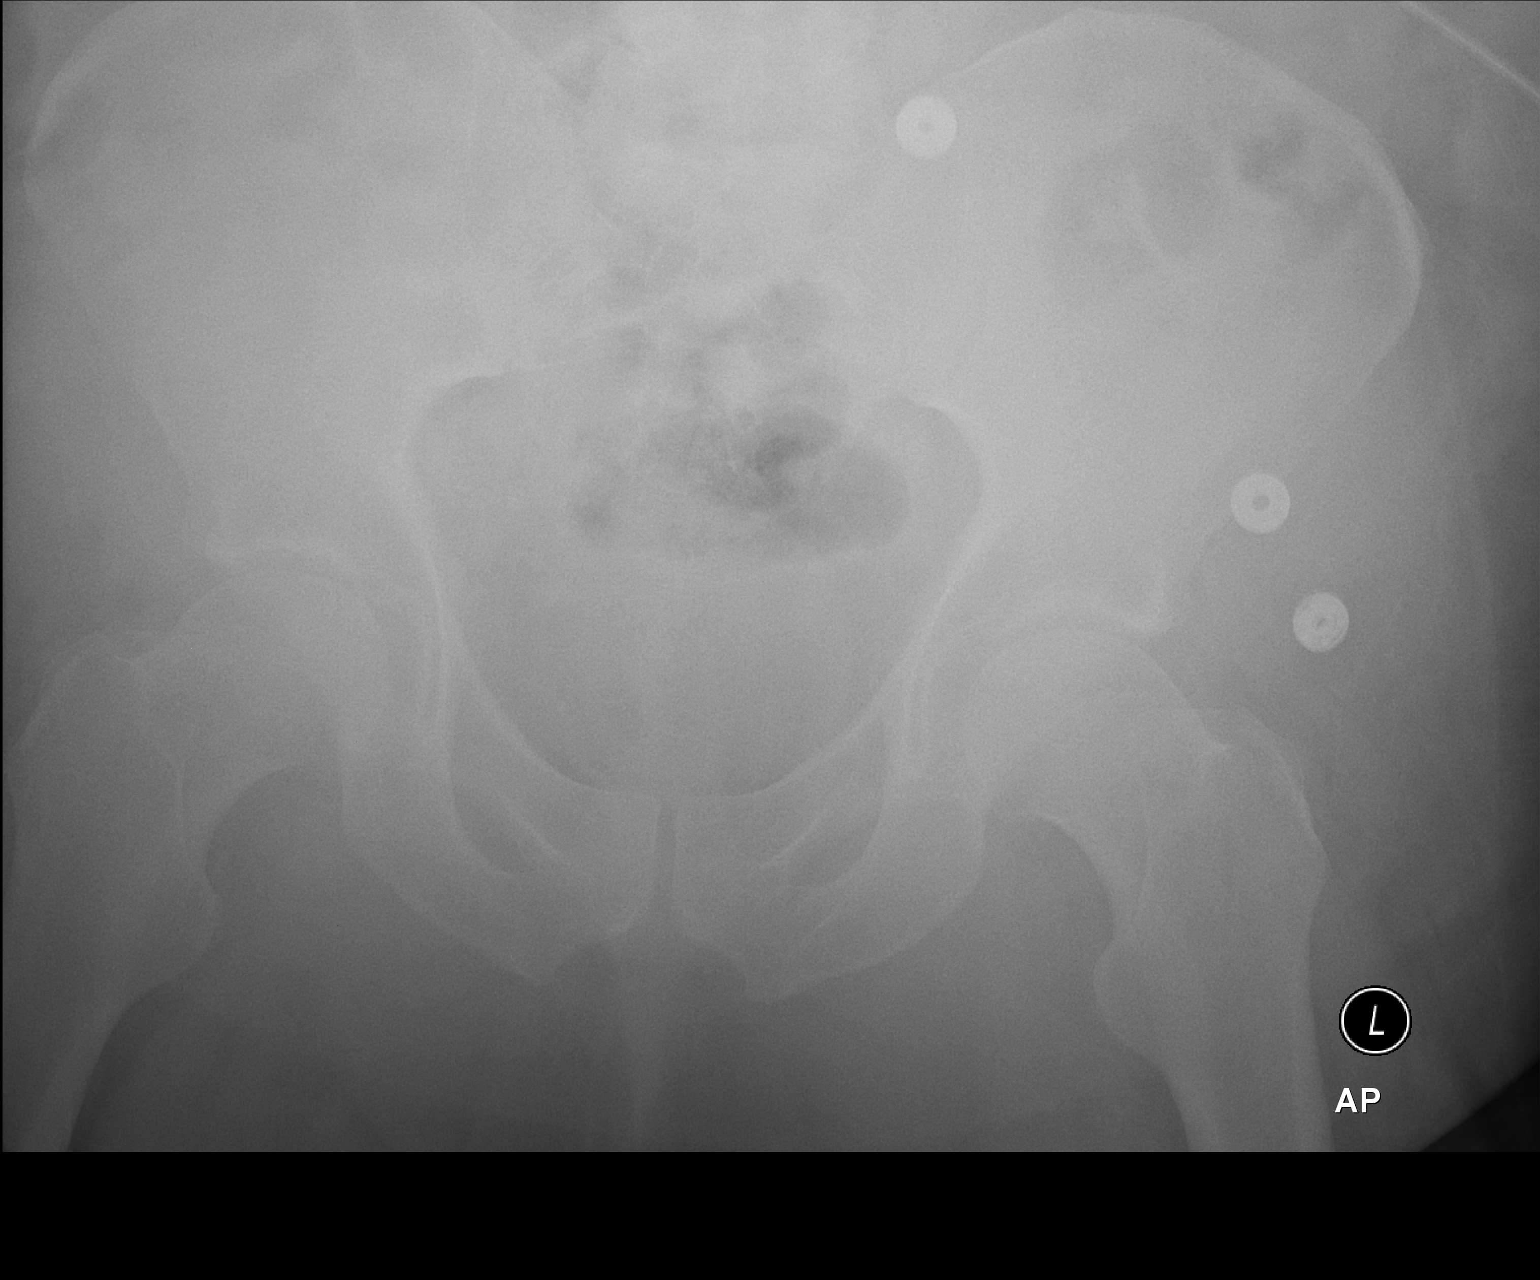

[AP (2 of 2)]
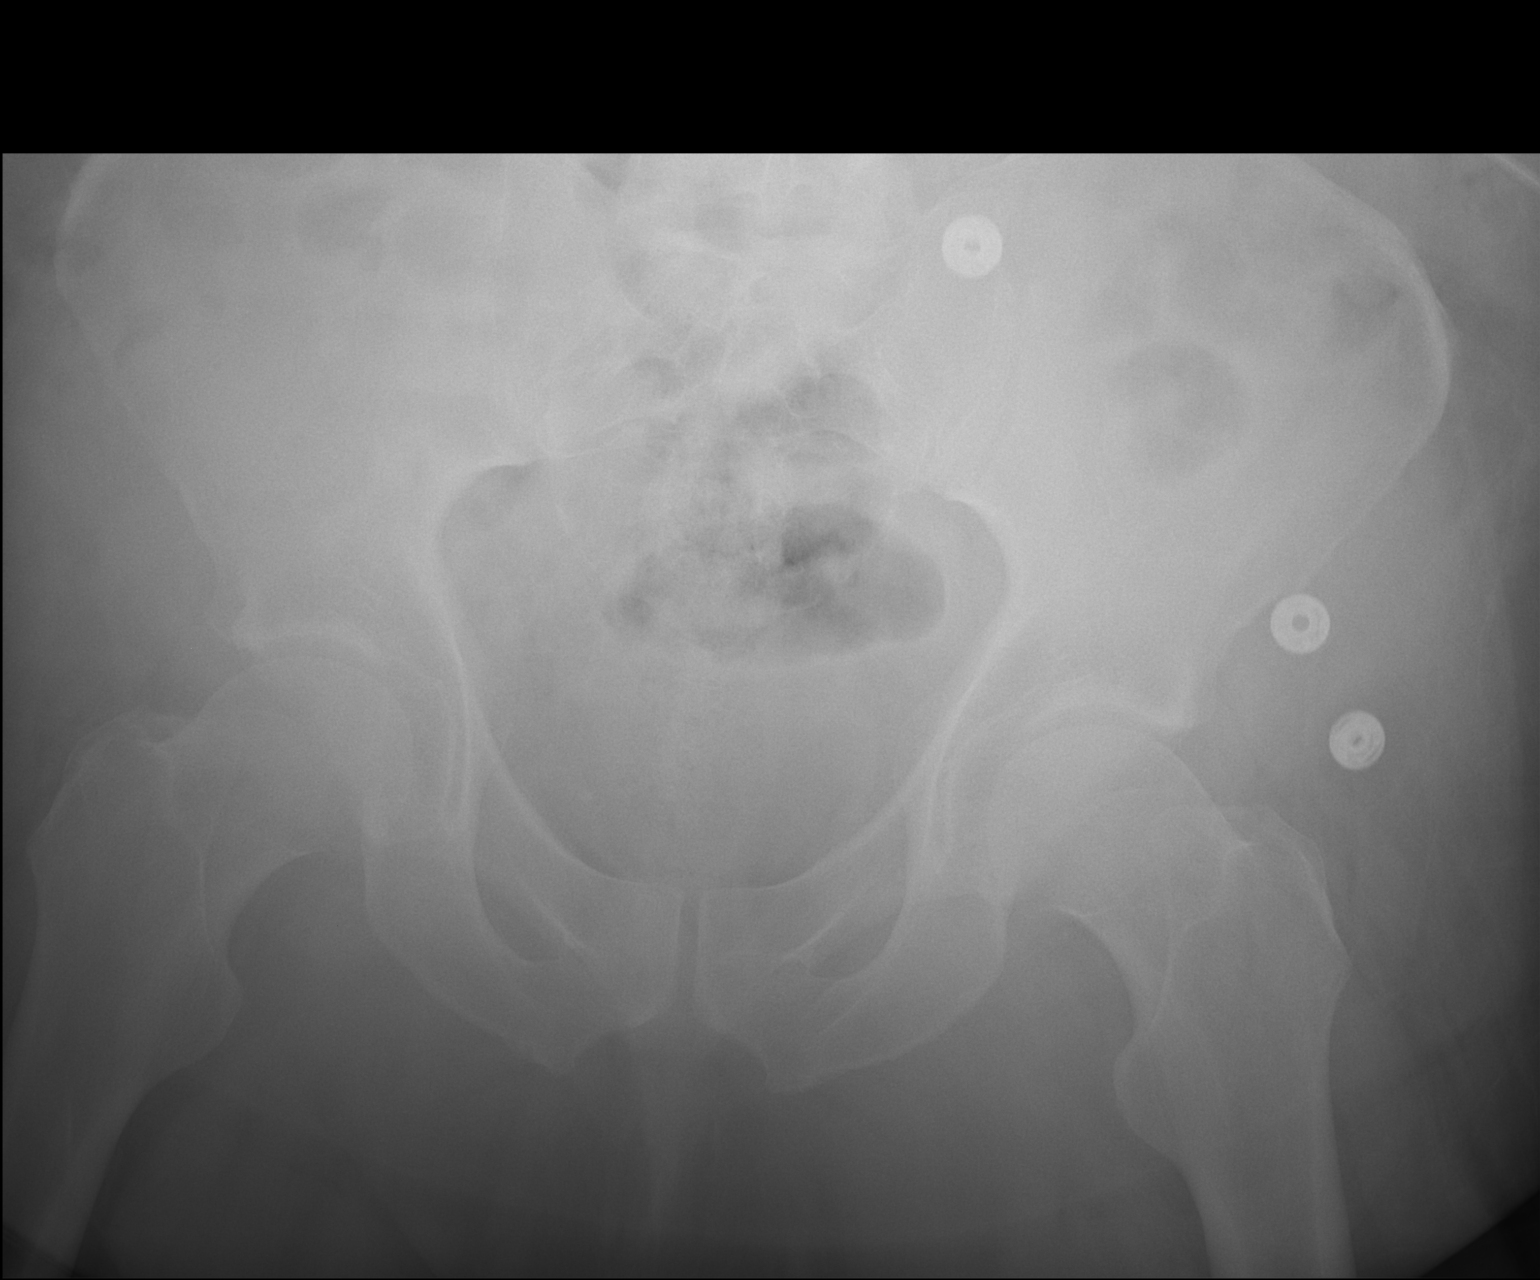

[2 of 2 positions shown; findings below may reference images not displayed]

FINDINGS: There is no evidence of pelvic fracture or diastasis. No pelvic bone
lesions are seen.
IMPRESSION: Negative.

## 2016-03-10 ENCOUNTER — Ambulatory Visit (HOSPITAL_BASED_OUTPATIENT_CLINIC_OR_DEPARTMENT_OTHER): Payer: Medicare Other

## 2016-03-30 ENCOUNTER — Ambulatory Visit (HOSPITAL_COMMUNITY): Payer: Medicare Other | Admitting: Psychiatry

## 2016-04-26 ENCOUNTER — Ambulatory Visit (HOSPITAL_BASED_OUTPATIENT_CLINIC_OR_DEPARTMENT_OTHER): Payer: Medicare Other | Attending: Critical Care Medicine

## 2016-05-05 ENCOUNTER — Ambulatory Visit (HOSPITAL_COMMUNITY): Payer: Self-pay | Admitting: Psychiatry

## 2016-05-16 IMAGING — MR MR CERVICAL SPINE W/O CM
5 series · 31 of 48 positions shown · non-contrast
Comparison: Cervical MRI 12/18/2008

CLINICAL DATA: Neck pain and right shoulder pain.

EXAM:
MRI CERVICAL SPINE WITHOUT CONTRAST
TECHNIQUE: Multiplanar, multisequence MR imaging of the cervical spine was
performed. No intravenous contrast was administered.

[Series 3: T2 · sagittal · 3.0mm · 0.66mm/px · 6 of 12 slices shown (1 of 2)]
[im 1/12]
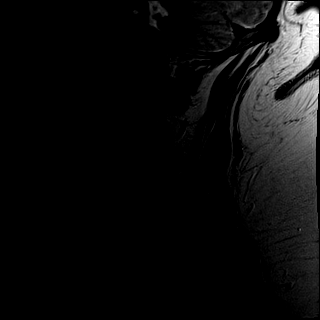
[im 3/12]
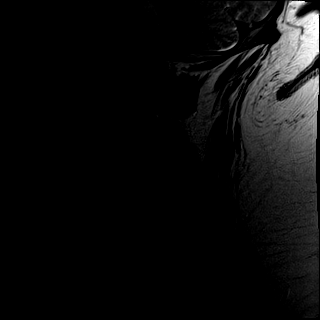
[im 5/12]
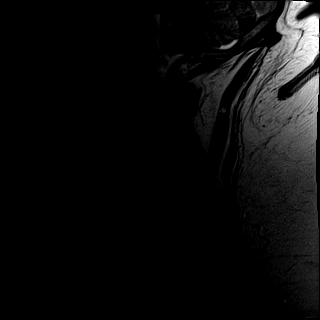
[im 7/12]
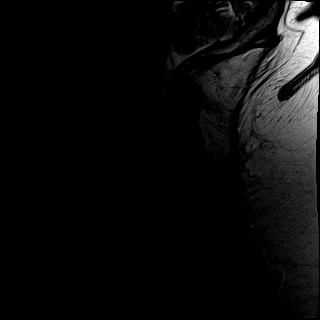
[im 9/12]
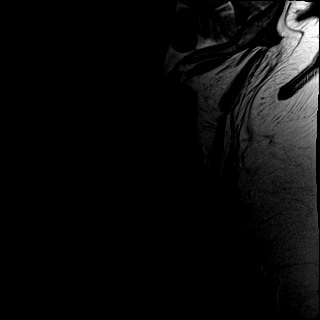
[im 12/12]
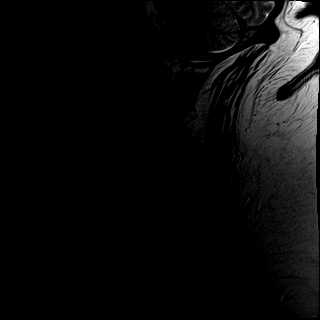

[Series 4: T1 · sagittal · 3.0mm · 0.66mm/px · 6 of 12 slices shown]
[im 1/12]
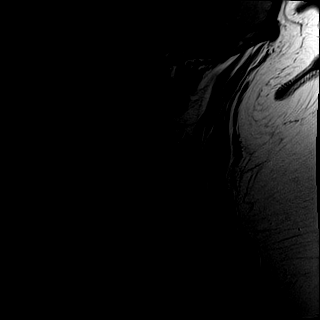
[im 3/12]
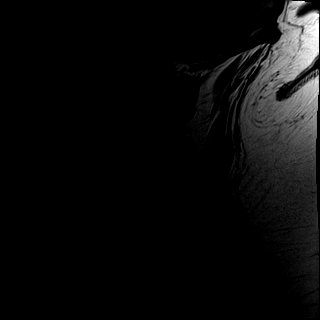
[im 5/12]
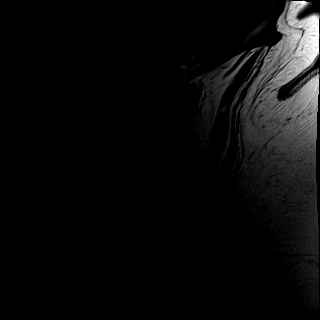
[im 7/12]
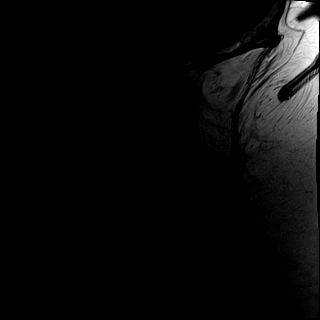
[im 9/12]
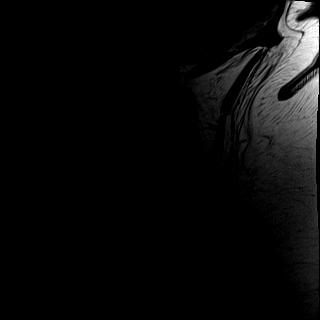
[im 12/12]
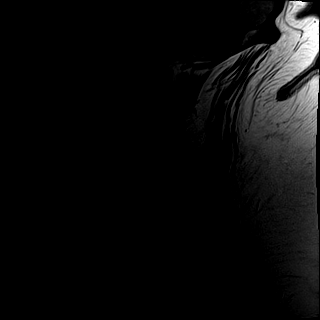

[Series 5: T2 · axial · 3.0mm · 0.52mm/px · z∈[-93,+16]mm · 9 of 30 slices shown (2 of 2)]
[im 1/30]
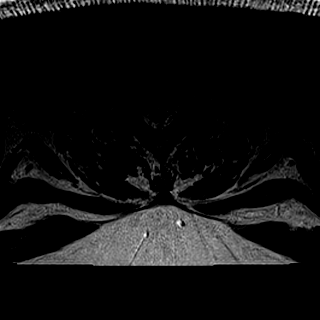
[im 5/30]
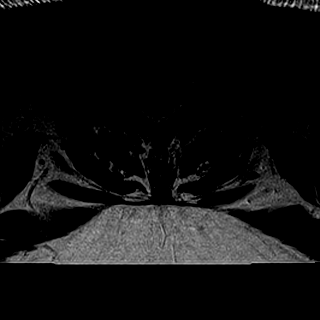
[im 9/30]
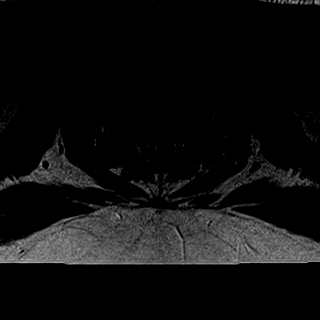
[im 13/30]
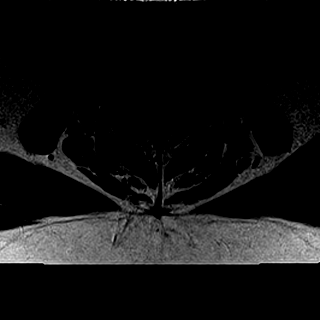
[im 15/30]
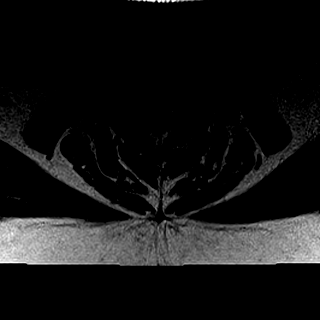
[im 17/30]
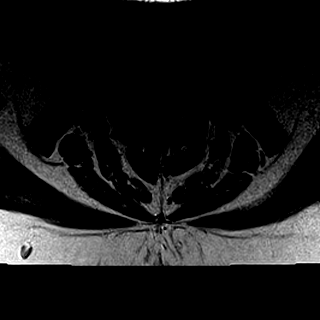
[im 21/30]
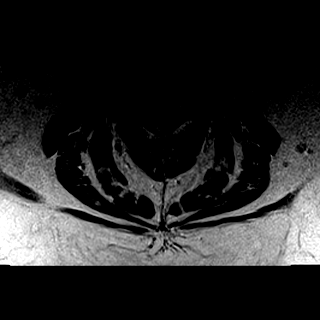
[im 25/30]
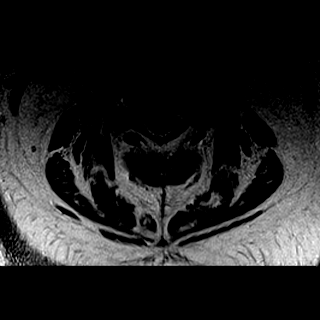
[im 30/30]
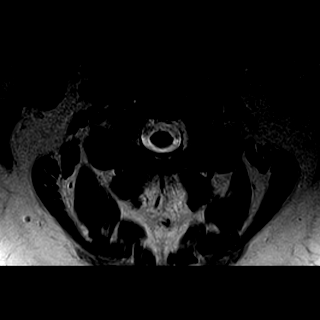

[Series 6: tir sag · sagittal · 3.5mm · 0.41mm/px · 6 of 12 slices shown]
[im 1/12]
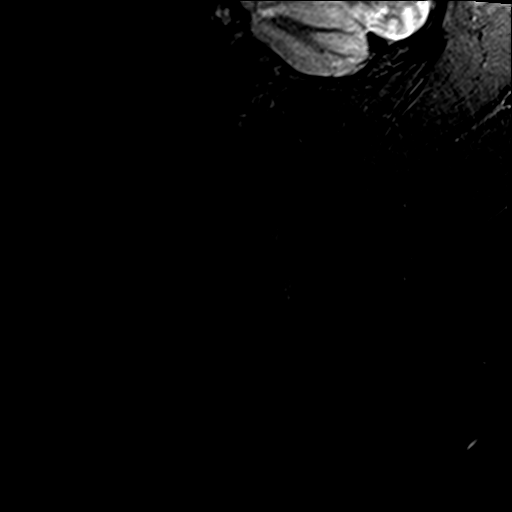
[im 3/12]
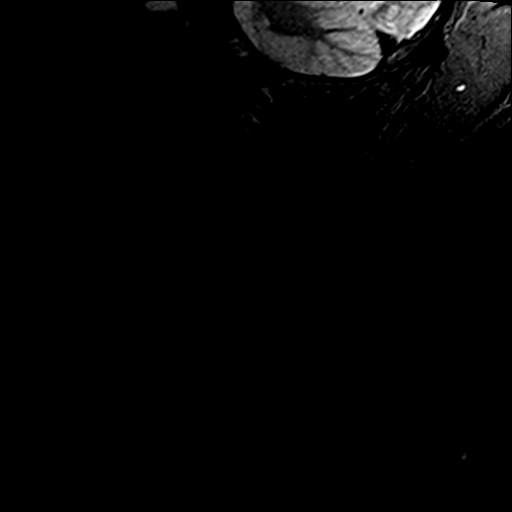
[im 5/12]
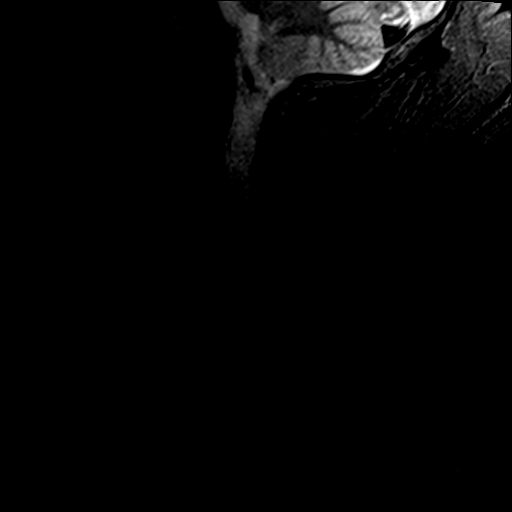
[im 7/12]
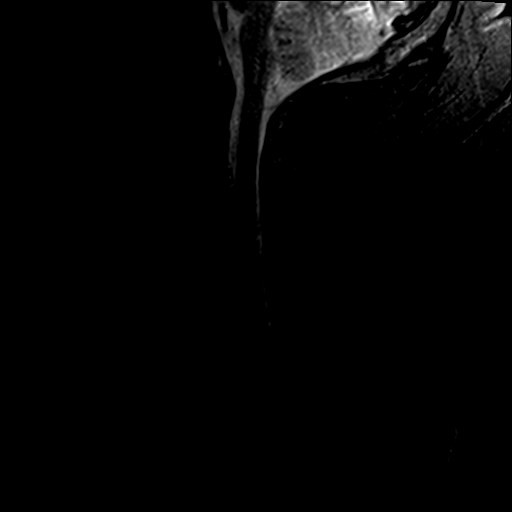
[im 9/12]
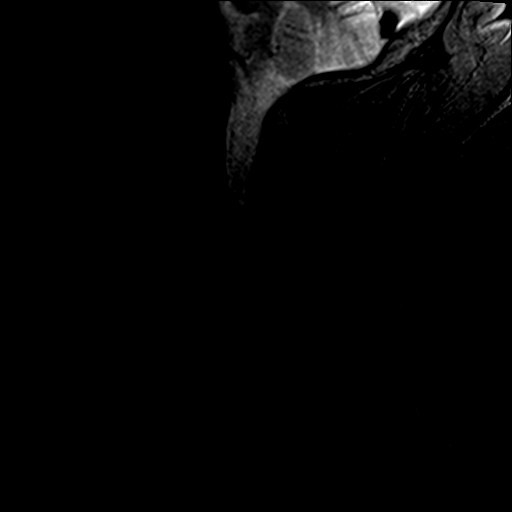
[im 12/12]
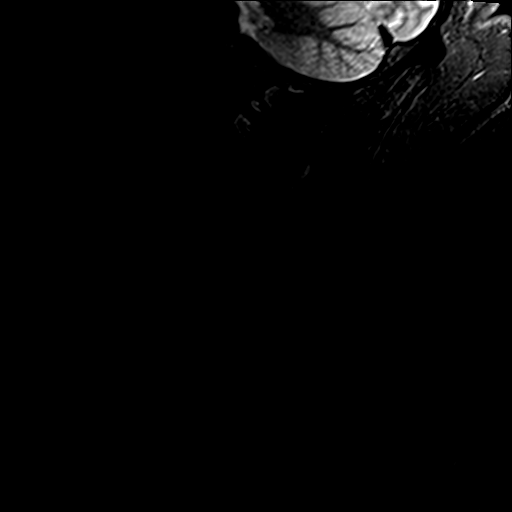

[Series 7: GRE · axial · 3.0mm · 0.33mm/px · z∈[-92,-47]mm · 4 of 30 slices shown]
[im 1/30]
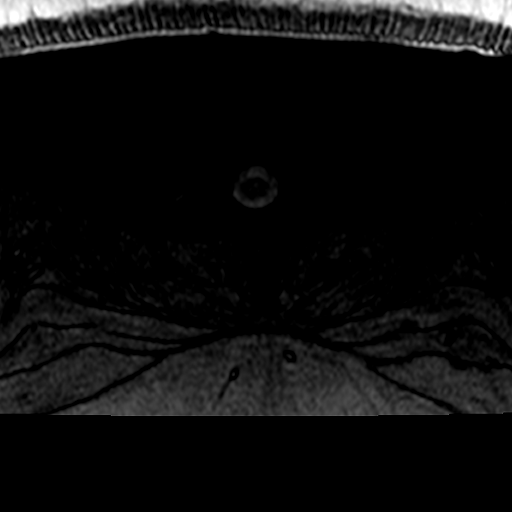
[im 5/30]
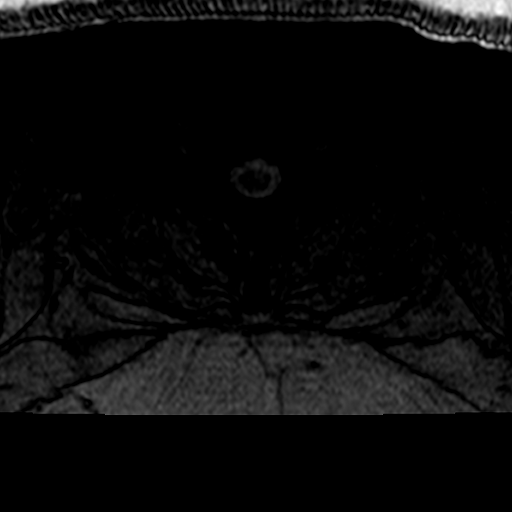
[im 9/30]
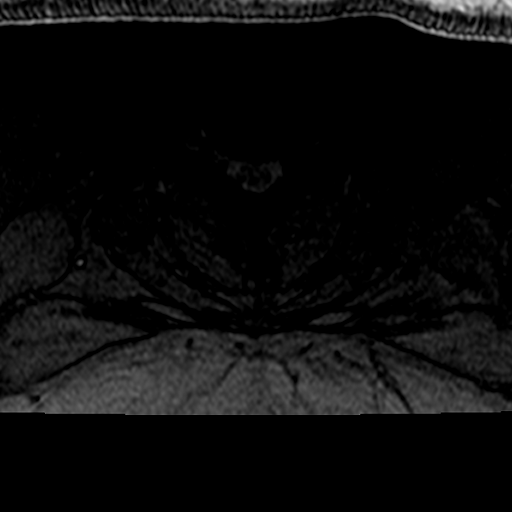
[im 13/30]
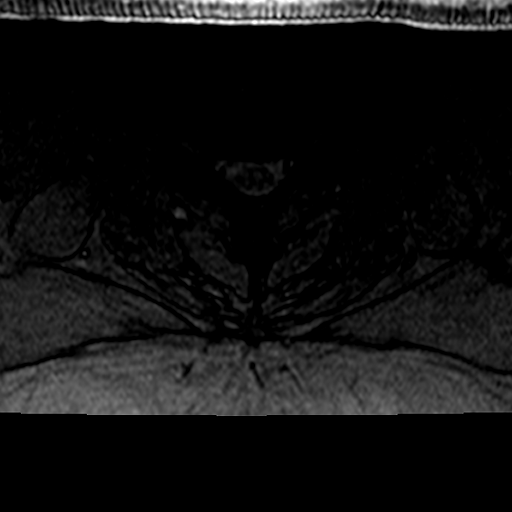

[31 of 48 positions shown; findings below may reference images not displayed]

FINDINGS: Image quality degraded by a large patient size and mild motion.

Normal cervical alignment. Negative for fracture or mass. No cord
compression. No cord lesion identified with limited imaging of the
cord due to technical factors.

C2-3:  Negative

C3-4: Left foraminal encroachment secondary to uncinate spurring.
This is unchanged. No cord deformity.

C4-5:  Negative

C5-6:  Negative

C6-7: Small central disc protrusion with associated spurring. Mild
spinal stenosis. No change from the prior study

C7-T1: Negative
IMPRESSION: Limited image quality

Left foraminal encroachment C3-4 due to spurring unchanged

Small central disc protrusion and mild spinal stenosis C6-7
unchanged.

## 2016-05-16 IMAGING — MR MR LUMBAR SPINE W/O CM
4 of 5 series · 18 of 48 positions shown · non-contrast
Comparison: Lumbar MRI 03/05/2009

CLINICAL DATA: Low back pain.  Lumbar neuritis.  Bilateral leg pain

EXAM:
MRI LUMBAR SPINE WITHOUT CONTRAST
TECHNIQUE: Multiplanar, multisequence MR imaging of the lumbar spine was
performed. No intravenous contrast was administered.

[Series 3: T1 · sagittal · 4.0mm · 0.84mm/px · 3 of 14 slices shown (1 of 2)]
[im 3/14]
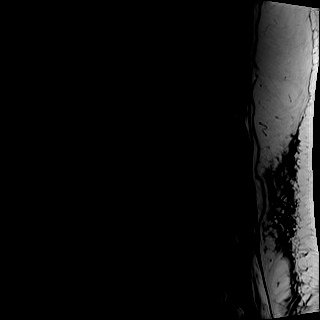
[im 8/14]
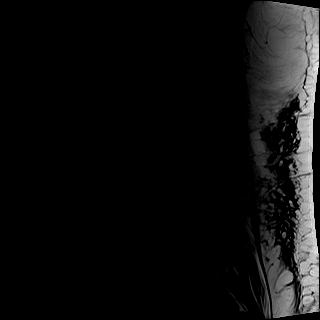
[im 14/14]
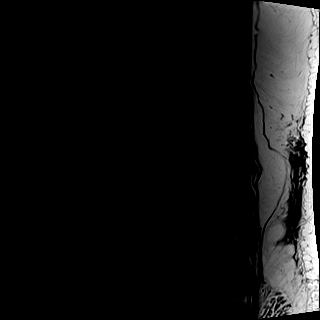

[Series 4: T2 · sagittal · 4.0mm · 0.42mm/px · 6 of 14 slices shown (1 of 2)]
[im 1/14]
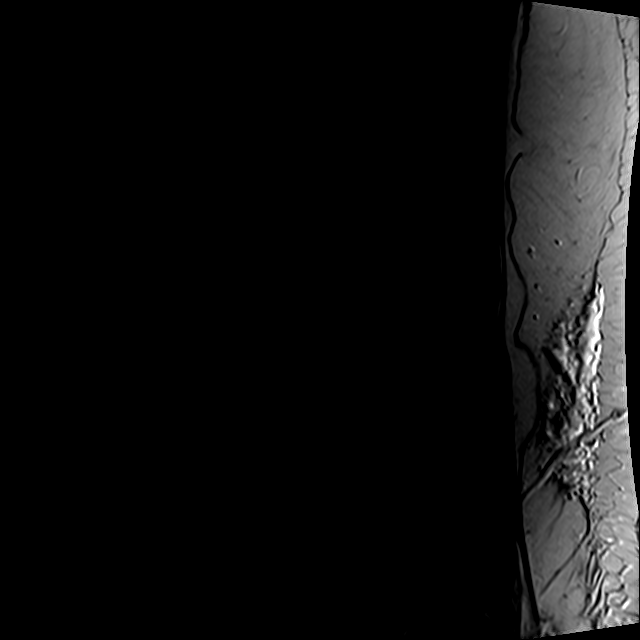
[im 3/14]
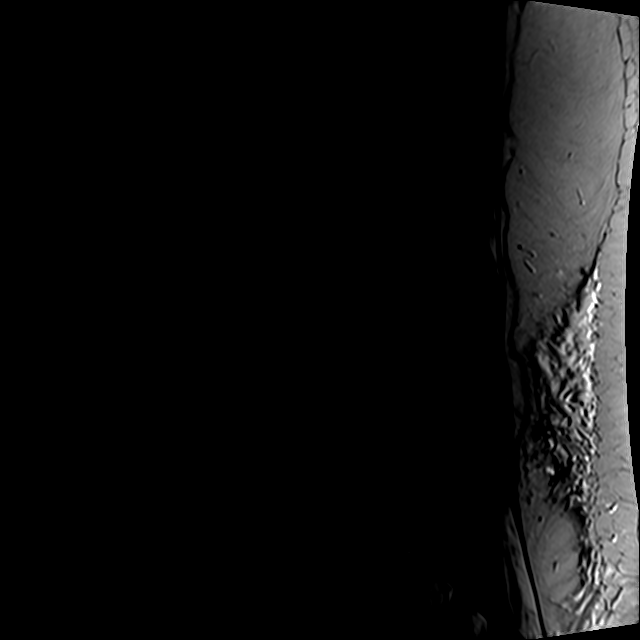
[im 6/14]
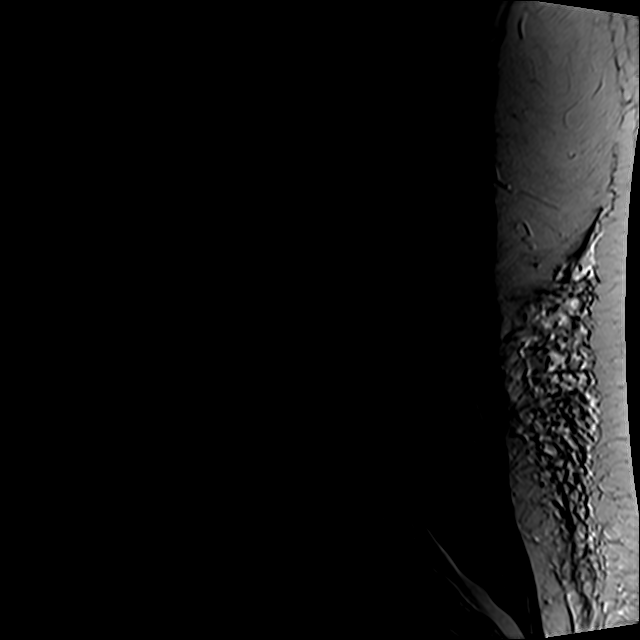
[im 8/14]
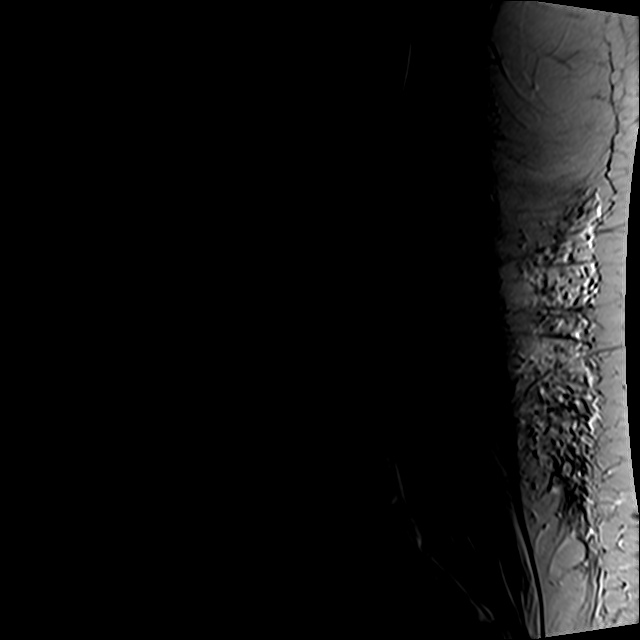
[im 11/14]
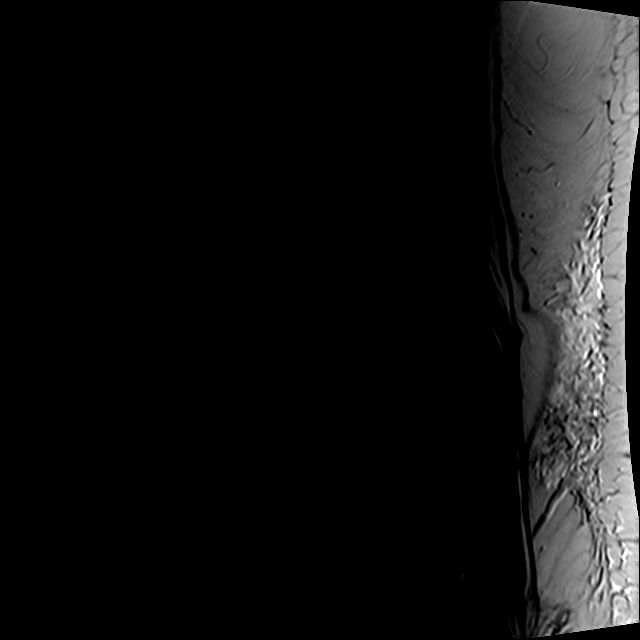
[im 14/14]
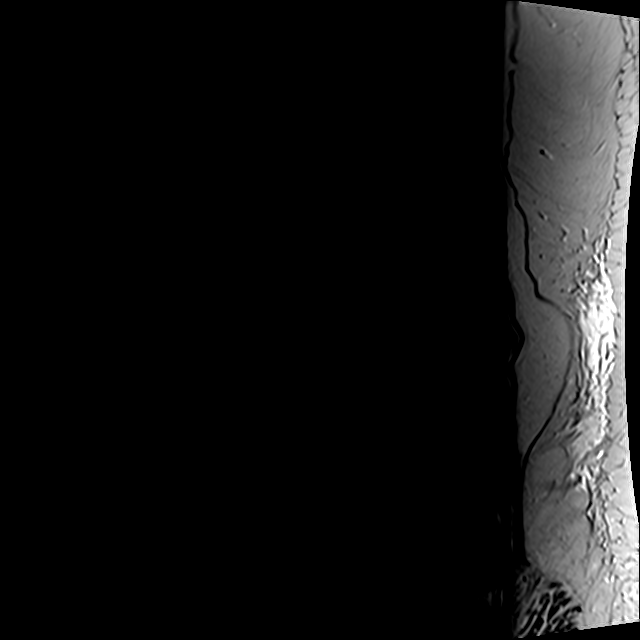

[Series 5: T2 · axial · 4.0mm · 0.34mm/px · z∈[-59,+104]mm · 6 of 38 slices shown (2 of 2)]
[im 1/38]
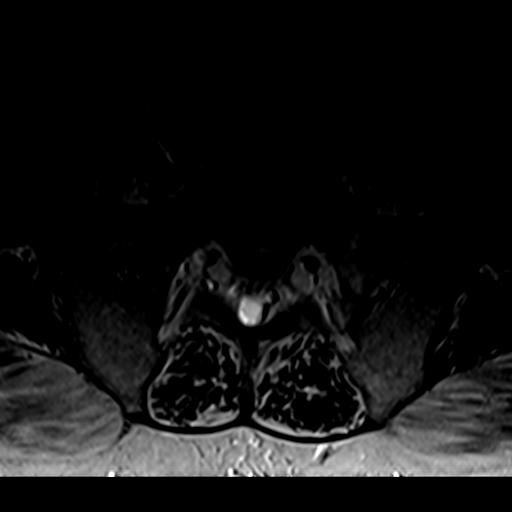
[im 6/38]
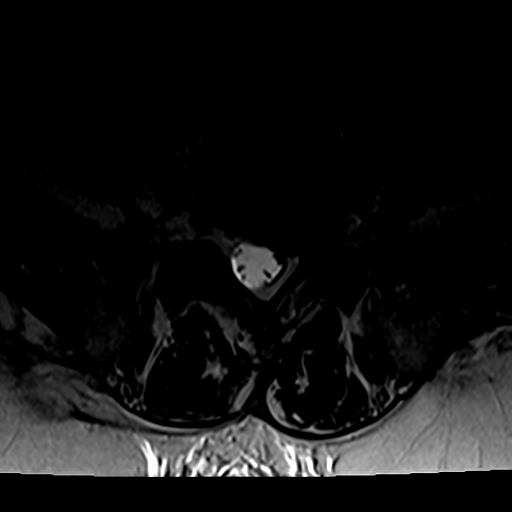
[im 11/38]
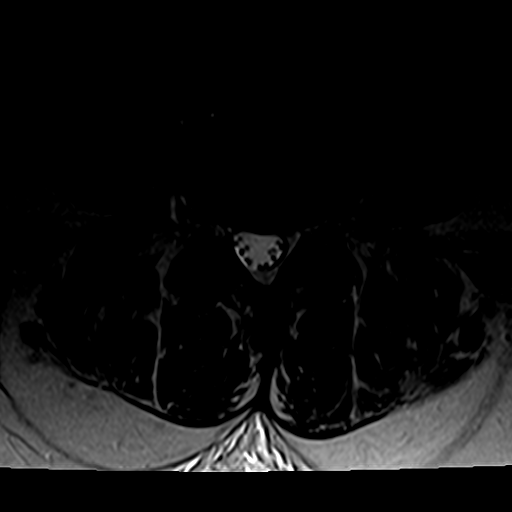
[im 16/38]
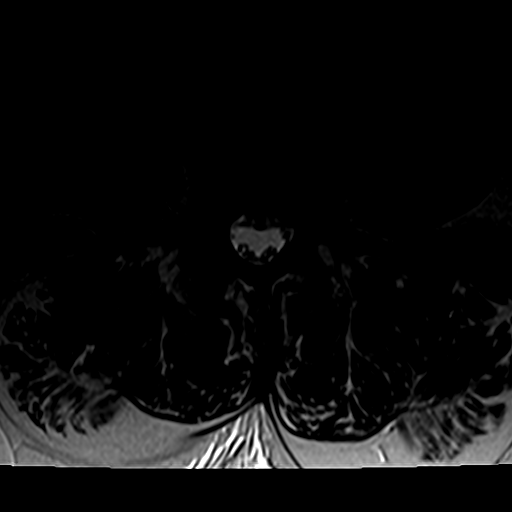
[im 19/38]
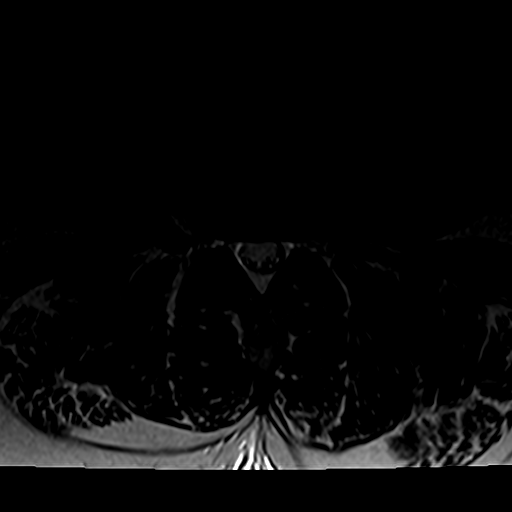
[im 32/38]
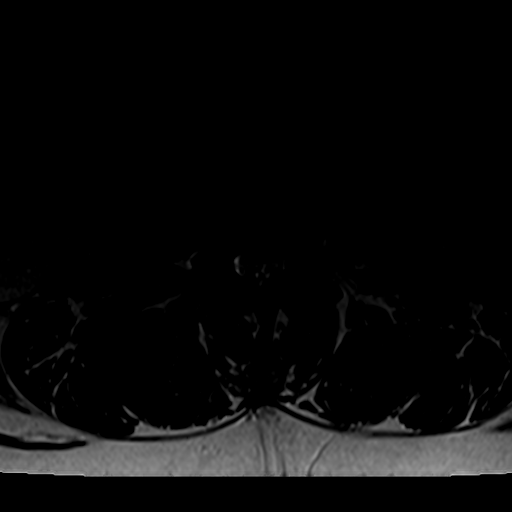

[Series 7: T1 · axial · 4.0mm · 0.34mm/px · z∈[-32,+104]mm · 3 of 38 slices shown (2 of 2)]
[im 6/38]
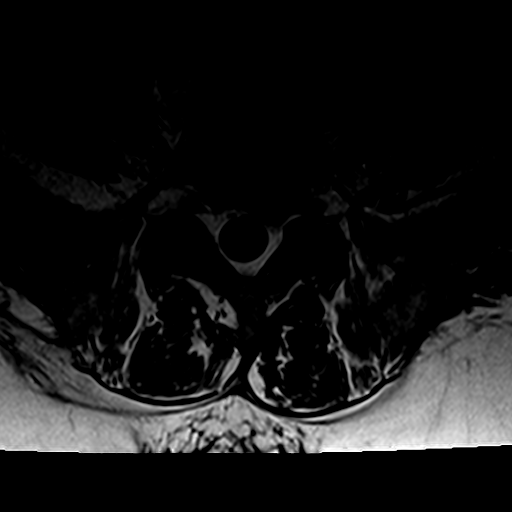
[im 19/38]
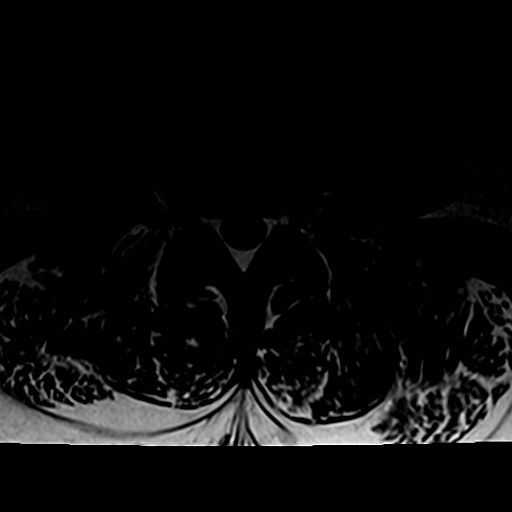
[im 32/38]
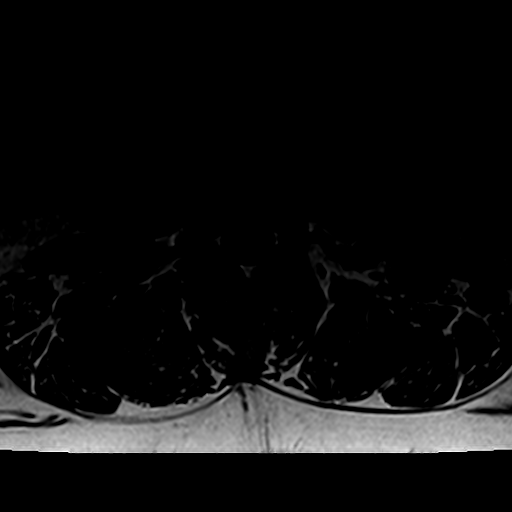

[18 of 48 positions shown; findings below may reference images not displayed]

FINDINGS: Normal lumbar alignment.  Negative for fracture.

L1 vertebral body lesion has enlarged since the prior study. This
has the appearance of benign hemangioma and measures 19 x 26 mm.
This shows hyperintensity on T1 and T2 with a well-defined sclerotic
margin. Previously the lesion measured approximately 16 mm in
diameter. Small fatty deposits L3 and L4 also slightly more
prominent. No evidence of mass lesion. Conus medullaris normal and
terminates at mid L2

L1-2:  Mild disc degeneration without stenosis

L2-3:  Mild disc and facet degeneration without spinal stenosis

L3-4: Mild disc and facet degeneration without significant spinal
stenosis. Mild congenital stenosis of the canal

L4-5: Mild disc degeneration and spurring without significant spinal
stenosis. No disc protrusion. Neural foramina.

L5-S1: Left-sided disc protrusion has improved. Increased osteophyte
on the left compared with the prior study. There is impingement of
the left S1 nerve root similar to the prior study. There is left
foraminal narrowing due to spurring. Right foramen patent.
IMPRESSION: L1 vertebral body lesion consistent with hemangioma has increased in
size since the prior study. No aggressive features on recent CT
scan.

L5-S1 left-sided disc protrusion has partially regressed however
there is progression of osteophyte formation associated with the
chronic disc protrusion. There is impingement of the left S1 nerve
root as noted on the prior study.

## 2016-05-29 IMAGING — US US ABDOMEN COMPLETE
1 series · 14 of 25 positions shown · non-contrast
Comparison: None.

CLINICAL DATA: Abdominal pain for 3 days

EXAM:
ABDOMEN ULTRASOUND COMPLETE

[Series 1: us abdomen complete · 0.30mm/px · 14 of 102 slices shown]
[im 1/102]
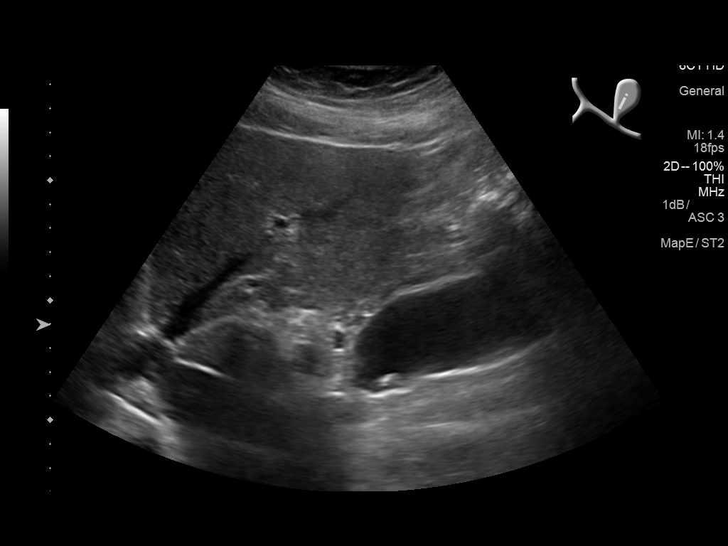
[im 9/102]
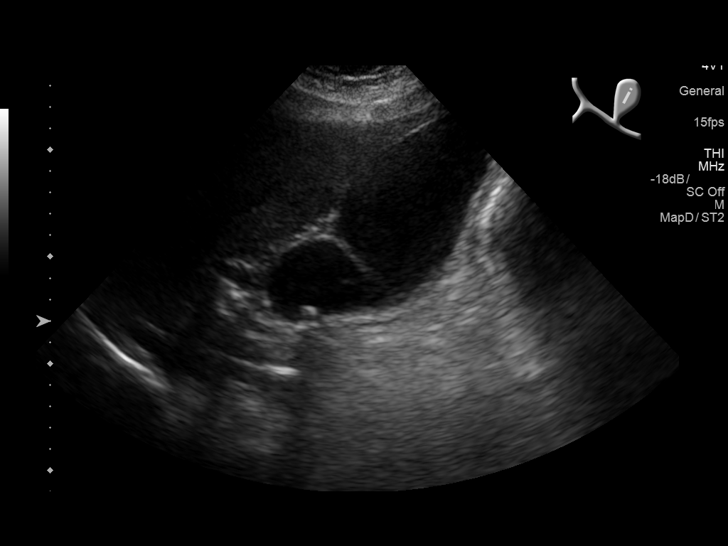
[im 17/102]
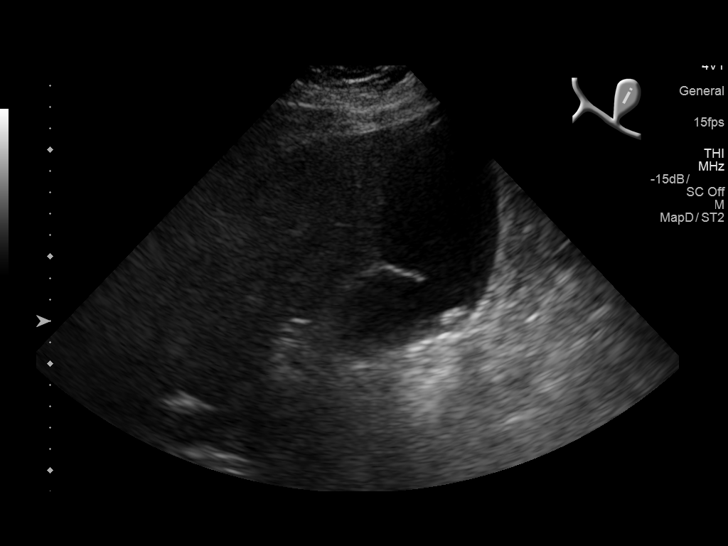
[im 26/102]
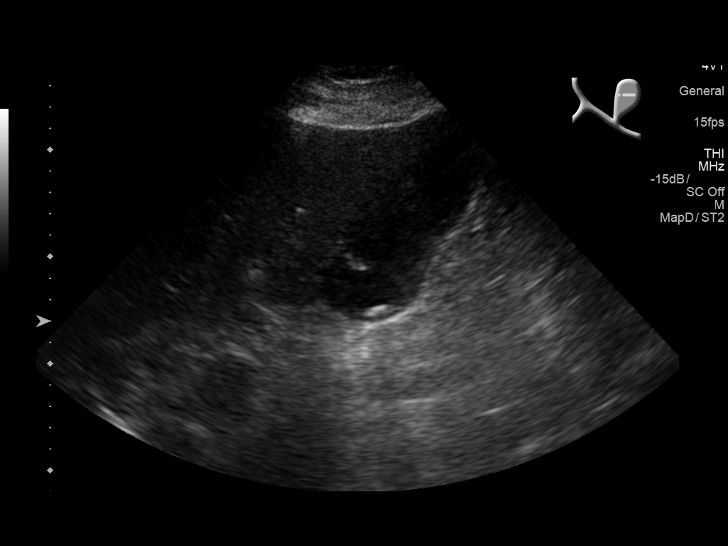
[im 34/102]
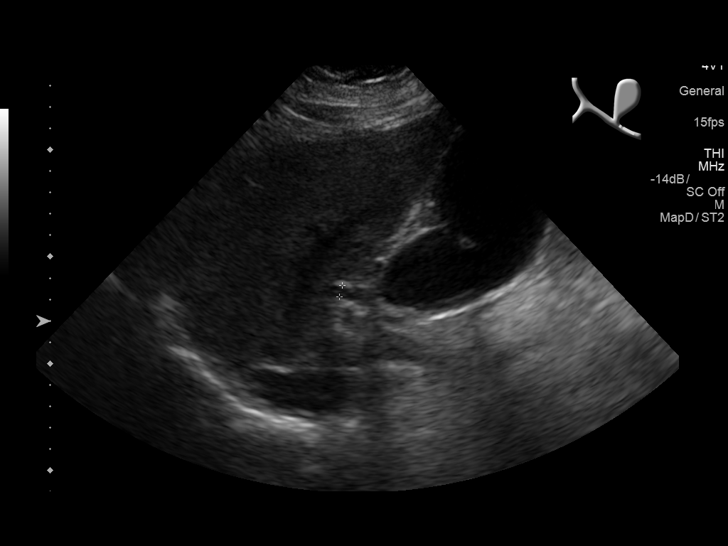
[im 38/102]
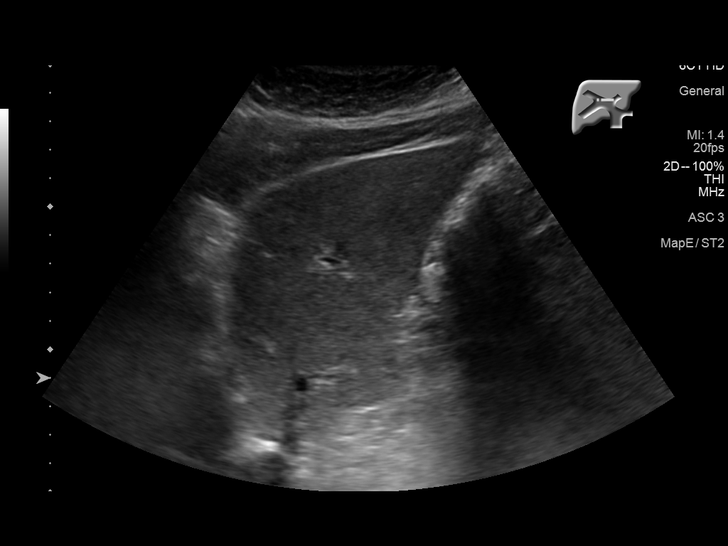
[im 47/102]
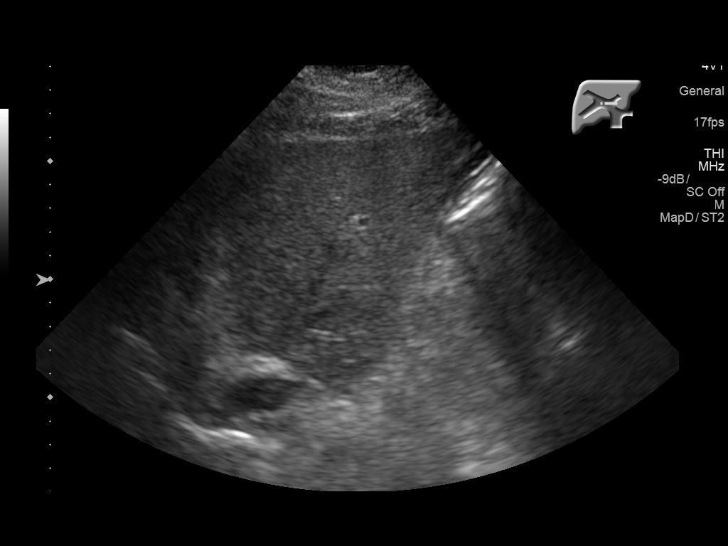
[im 55/102]
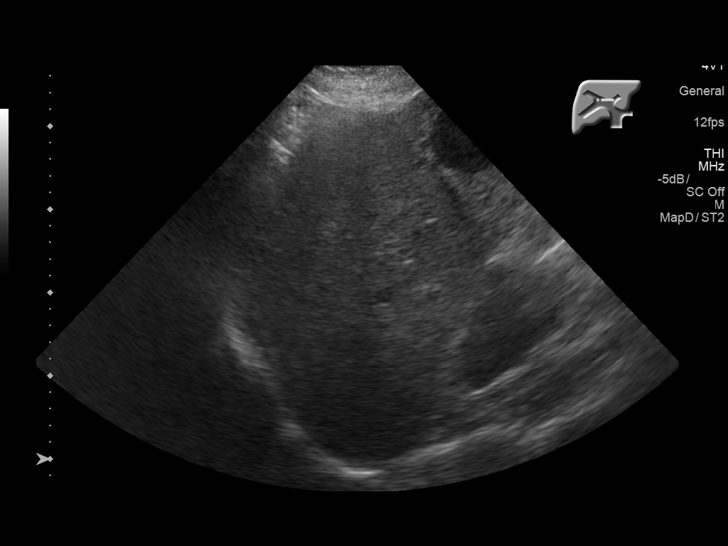
[im 64/102]
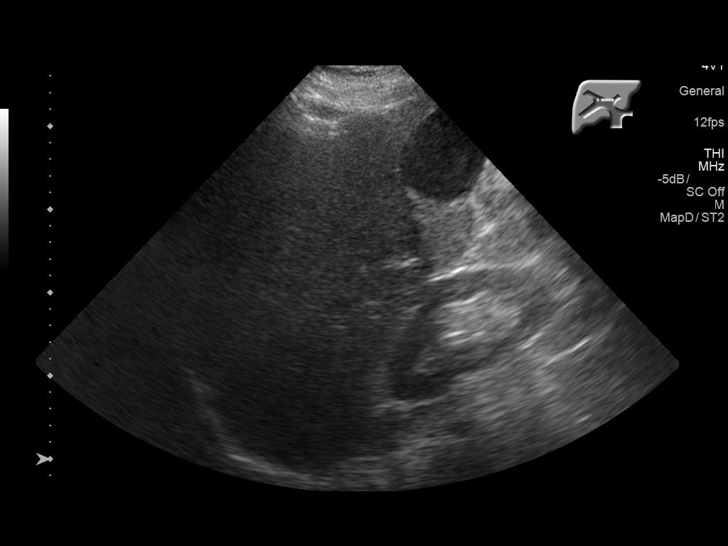
[im 68/102]
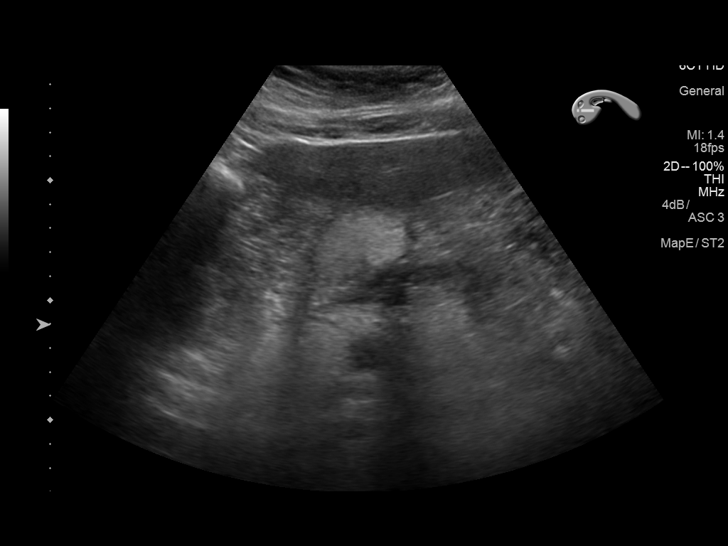
[im 76/102]
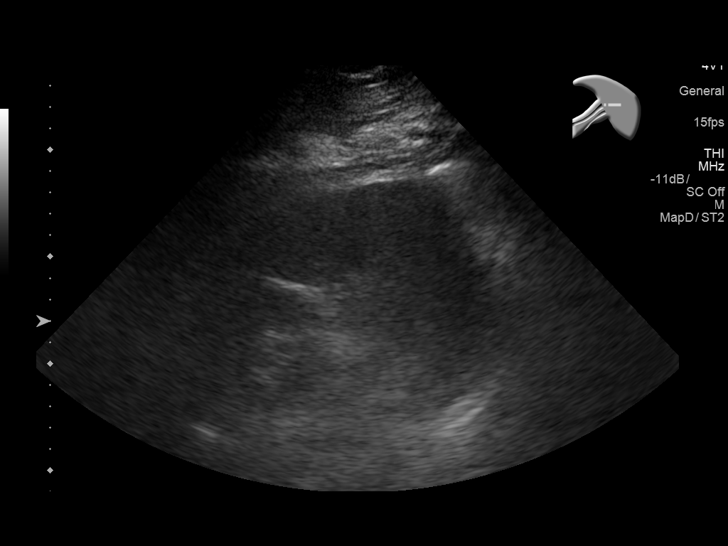
[im 85/102]
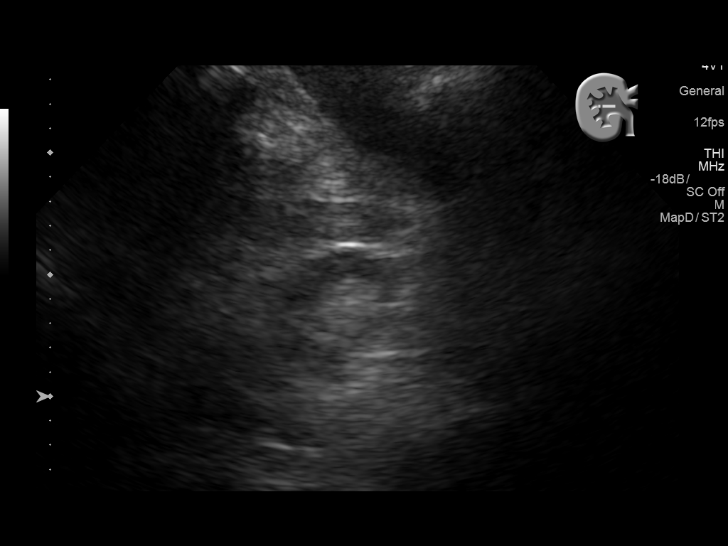
[im 93/102]
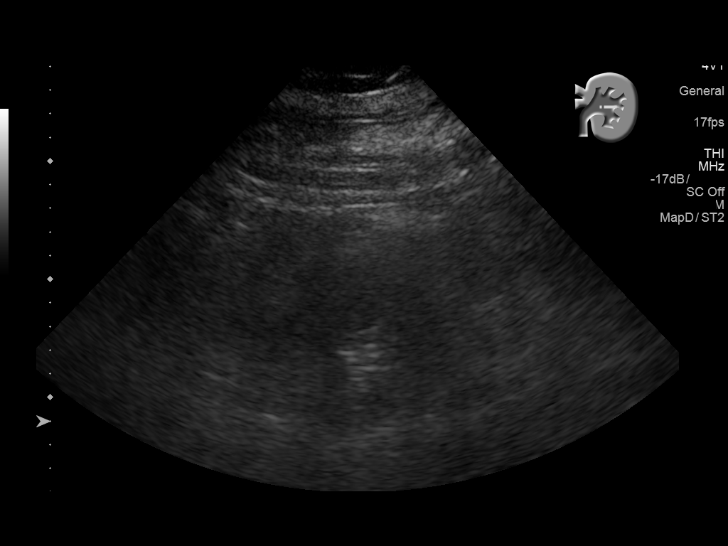
[im 102/102]
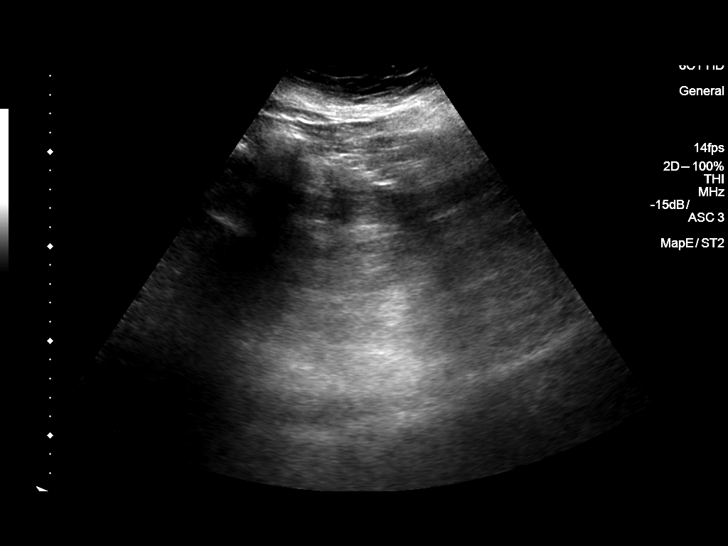

[14 of 25 positions shown; findings below may reference images not displayed]

FINDINGS: Gallbladder: There is a mobile 1.4 cm calculus within the
gallbladder lumen. There is no gallbladder mural thickening or
pericholecystic fluid. The patient was not tender to probe pressure
over the gallbladder.

Common bile duct: Diameter: 5.5 mm, normal.

Liver: Increased parenchymal echogenicity consistent with fatty
infiltration. No focal liver lesion.

IVC: No abnormality visualized.

Pancreas: Limited.  Visualized portion unremarkable.

Spleen: Size and appearance within normal limits.

Right Kidney: Length: 15.4 cm. Echogenicity within normal limits. No
mass or hydronephrosis visualized.

Left Kidney: Length: 15.0 cm. Echogenicity within normal limits. No
mass or hydronephrosis visualized.

Abdominal aorta: No aneurysm visualized.

Other findings: None.
IMPRESSION: Cholelithiasis without sonographic evidence of cholecystitis.
Increased hepatic echogenicity, likely fatty infiltration.

## 2016-06-01 ENCOUNTER — Telehealth: Payer: Self-pay

## 2016-06-01 NOTE — Telephone Encounter (Signed)
Medication list has been printed and placed at the front desk for pickup.

## 2016-06-01 NOTE — Telephone Encounter (Signed)
Pt wife contacted the office and is requesting a print out of pt medication list since he has been here for a drug program thorough medicare. Pt wife states pt will come by and pick it up when ready. Please f/u

## 2016-06-19 ENCOUNTER — Emergency Department (HOSPITAL_COMMUNITY): Payer: Medicare Other

## 2016-06-19 ENCOUNTER — Encounter (HOSPITAL_COMMUNITY): Payer: Self-pay | Admitting: Emergency Medicine

## 2016-06-19 ENCOUNTER — Emergency Department (HOSPITAL_COMMUNITY)
Admission: EM | Admit: 2016-06-19 | Discharge: 2016-06-19 | Discharge status: Against Medical Advice | Payer: Medicare Other | Attending: Emergency Medicine | Admitting: Emergency Medicine

## 2016-06-19 DIAGNOSIS — J45909 Unspecified asthma, uncomplicated: Secondary | ICD-10-CM | POA: Insufficient documentation

## 2016-06-19 DIAGNOSIS — Z794 Long term (current) use of insulin: Secondary | ICD-10-CM | POA: Insufficient documentation

## 2016-06-19 DIAGNOSIS — F419 Anxiety disorder, unspecified: Secondary | ICD-10-CM | POA: Diagnosis not present

## 2016-06-19 DIAGNOSIS — R0602 Shortness of breath: Secondary | ICD-10-CM | POA: Insufficient documentation

## 2016-06-19 DIAGNOSIS — F17210 Nicotine dependence, cigarettes, uncomplicated: Secondary | ICD-10-CM | POA: Diagnosis not present

## 2016-06-19 DIAGNOSIS — Z79899 Other long term (current) drug therapy: Secondary | ICD-10-CM | POA: Diagnosis not present

## 2016-06-19 DIAGNOSIS — I10 Essential (primary) hypertension: Secondary | ICD-10-CM | POA: Insufficient documentation

## 2016-06-19 DIAGNOSIS — E119 Type 2 diabetes mellitus without complications: Secondary | ICD-10-CM | POA: Insufficient documentation

## 2016-06-19 DIAGNOSIS — Z8673 Personal history of transient ischemic attack (TIA), and cerebral infarction without residual deficits: Secondary | ICD-10-CM | POA: Diagnosis not present

## 2016-06-19 LAB — COMPREHENSIVE METABOLIC PANEL
ALT: 33 U/L (ref 17–63)
AST: 31 U/L (ref 15–41)
Albumin: 3.6 g/dL (ref 3.5–5.0)
Alkaline Phosphatase: 56 U/L (ref 38–126)
Anion gap: 11 (ref 5–15)
BUN: 8 mg/dL (ref 6–20)
CHLORIDE: 100 mmol/L — AB (ref 101–111)
CO2: 23 mmol/L (ref 22–32)
CREATININE: 0.71 mg/dL (ref 0.61–1.24)
Calcium: 8.9 mg/dL (ref 8.9–10.3)
GFR calc non Af Amer: >60 mL/min (ref >60)
Glucose, Bld: 122 mg/dL — ABNORMAL HIGH (ref 65–99)
POTASSIUM: 4.4 mmol/L (ref 3.5–5.1)
SODIUM: 134 mmol/L — AB (ref 135–145)
Total Bilirubin: 0.4 mg/dL (ref 0.3–1.2)
Total Protein: 7.4 g/dL (ref 6.5–8.1)

## 2016-06-19 LAB — CBC WITH DIFFERENTIAL/PLATELET
BASOS PCT: 0 %
Basophils Absolute: 0 10*3/uL (ref 0.0–0.1)
EOS PCT: 3 %
Eosinophils Absolute: 0.2 10*3/uL (ref 0.0–0.7)
HCT: 44.5 % (ref 39.0–52.0)
Hemoglobin: 14.5 g/dL (ref 13.0–17.0)
LYMPHS ABS: 3.4 10*3/uL (ref 0.7–4.0)
Lymphocytes Relative: 40 %
MCH: 28.6 pg (ref 26.0–34.0)
MCHC: 32.6 g/dL (ref 30.0–36.0)
MCV: 87.8 fL (ref 78.0–100.0)
MONO ABS: 0.5 10*3/uL (ref 0.1–1.0)
MONOS PCT: 5 %
Neutro Abs: 4.5 10*3/uL (ref 1.7–7.7)
Neutrophils Relative %: 52 %
PLATELETS: 258 10*3/uL (ref 150–400)
RBC: 5.07 MIL/uL (ref 4.22–5.81)
RDW: 14.2 % (ref 11.5–15.5)
WBC: 8.5 10*3/uL (ref 4.0–10.5)

## 2016-06-19 LAB — TROPONIN I: Troponin I: <0.03 ng/mL

## 2016-06-19 LAB — D-DIMER, QUANTITATIVE: D-Dimer, Quant: 0.5 ug/mL-FEU (ref 0.00–0.50)

## 2016-06-19 LAB — BRAIN NATRIURETIC PEPTIDE: B Natriuretic Peptide: 16.2 pg/mL (ref 0.0–100.0)

## 2016-06-19 MED ORDER — LORAZEPAM 2 MG/ML IJ SOLN
2.0000 mg | Freq: Once | INTRAMUSCULAR | Status: AC
  Administered 2016-06-19: 2 mg via INTRAVENOUS
  Filled 2016-06-19: qty 1

## 2016-06-19 MED ORDER — IPRATROPIUM-ALBUTEROL 0.5-2.5 (3) MG/3ML IN SOLN
3.0000 mL | Freq: Once | RESPIRATORY_TRACT | Status: AC
  Administered 2016-06-19: 3 mL via RESPIRATORY_TRACT
  Filled 2016-06-19: qty 3

## 2016-06-19 MED ORDER — IOPAMIDOL (ISOVUE-370) INJECTION 76%
INTRAVENOUS | Status: AC
  Filled 2016-06-19: qty 100

## 2016-06-19 MED ORDER — SODIUM CHLORIDE 0.9 % IV BOLUS (SEPSIS)
500.0000 mL | Freq: Once | INTRAVENOUS | Status: AC
  Administered 2016-06-19: 500 mL via INTRAVENOUS

## 2016-06-19 NOTE — Progress Notes (Signed)
Patient will be discharged from ED, outpatient instructions form on extravasation was given to the patient and his wife. Patient was advised to follow the directions on the form, keeping his arm elevated and apply ice 20 minutes four times a day for 3 days.  Patient was given the number to the CT department if he had any questions.  Patient was also told to come back to the ED if he had increased swelling, pain, blistering, loss of sensation, increased redness or increased warmth around the infected area, or increasing hardness in his left forearm to come back to the ED.  Patient and his wife both stated "they understood and that his arm is feeling much better."

## 2016-06-19 NOTE — ED Notes (Signed)
CT at bedside assessing infiltration

## 2016-06-19 NOTE — ED Notes (Addendum)
Upon entering room to introduce self to patient, patient was not present in room. Immediate area and waiting room checked for patient. He was unable to be located. Pt left without informing hospital staff. Discharge instructions unable to be reviewed. No AMA signature or final set of V/S able to be obtained for this reason.

## 2016-06-19 NOTE — ED Notes (Signed)
CT Tech advised that the IV infiltrated during the CT scan.

## 2016-06-19 NOTE — ED Provider Notes (Signed)
[facility] DEPT Provider Note   CSN: [id number] Arrival date & time: 06/19/16  3244   By signing my name below, I, Delton Prairie, attest that this documentation has been prepared under the direction and in the presence of Orpah Greek, MD  Electronically Signed: Delton Prairie, ED Scribe. 06/19/16. 12:51 AM.   History   Chief Complaint Chief Complaint  Patient presents with  . Shortness of Breath    HPI Comments:  Bryan Gallegos is a 41 y.o. male, with a PMHx of hyperlipidemia, HTN, asthma and anxiety, who presents to the Emergency Department, via EMS, complaining of acute onset, persistent SOB x yesterday. He also reports diaphoresis and productive cough with black sputum. He notes he has not been able to sleep for 2 days due to his SOB. His SOB is worse with laying flat. No alleviating factors noted. Pt denies any associated symptoms. No other complaints noted at this time.    The history is provided by the patient. No language interpreter was used.    Past Medical History:  Diagnosis Date  . Adult ADHD   . Anaphylactic reaction   . Anxiety   . Asthma   . Bipolar disorder ([place])   . Complication of anesthesia    Per patient difficult intubation;  . Diabetes mellitus   . Difficult intubation    Per patient  . Heart valve disorder    s/p echocardiogram  . Hyperlipidemia   . Hypertension   . Morbidly obese ([place])   . OSA (obstructive sleep apnea)   . Transient cerebral ischemia    Unknown    Patient Active Problem List   Diagnosis Date Noted  . Acute and chronic respiratory failure with hypercapnia ([place])   . Pulmonary edema   . Syncope   . Respiratory failure ([place]) 01/04/2016  . Drug overdose   . Acute encephalopathy 01/01/2016  . Hyponatremia 01/01/2016  . Chronic pain syndrome 10/22/2015  . Healthcare maintenance 10/22/2015  . Hoarse voice quality 10/11/2015  . Major depressive disorder, recurrent severe without psychotic features ([place])  09/04/2015  . Polysubstance dependence including opioid type drug, episodic abuse ([place]) 09/04/2015  . RUQ abdominal pain   . Cholelithiasis 05/17/2015  . Antisocial personality disorder 04/11/2015  . Opioid use disorder, severe, dependence ([place]) 04/11/2015  . Benzodiazepine dependence ([place]) 04/11/2015  . Tobacco use disorder 04/10/2015  . Narcotic dependency, continuous ([place]) 03/14/2014  . Chronic back pain greater than 3 months duration 01/28/2014  . Gastroesophageal reflux disease with esophagitis 01/28/2014  . Essential hypertension, benign 01/28/2014  . Diabetes mellitus type II, controlled ([place]) 01/28/2014  . Morbid obesity ([place]) 01/28/2014  . TIA (transient ischemic attack) 09/02/2013  . HLD (hyperlipidemia) 08/06/2013  . OSA on CPAP 08/06/2013    Past Surgical History:  Procedure Laterality Date  . CARPAL TUNNEL RELEASE    . ESOPHAGOGASTRODUODENOSCOPY N/A 04/05/2015   Procedure: ESOPHAGOGASTRODUODENOSCOPY (EGD);  Surgeon: Rogene Houston, MD;  Location: [facility];  Service: Endoscopy;  Laterality: N/A;  . ESOPHAGOGASTRODUODENOSCOPY (EGD) WITH PROPOFOL N/A 05/21/2015   Procedure: ESOPHAGOGASTRODUODENOSCOPY (EGD) WITH PROPOFOL;  Surgeon: Ladene Artist, MD;  Location: [facility] ENDOSCOPY;  Service: Endoscopy;  Laterality: N/A;  . NOSE SURGERY    . TOOTH EXTRACTION    . WISDOM TOOTH EXTRACTION         Home Medications    Prior to Admission medications   Medication Sig Start Date End Date Taking? Authorizing Provider  albuterol (PROVENTIL HFA;VENTOLIN HFA) 108 (90 Base) MCG/ACT inhaler  Inhale 1-2 puffs into the lungs every 6 (six) hours as needed for wheezing or shortness of breath. 11/20/15  Yes Tiffany Carlota Raspberry, PA-C  alprazolam Duanne Moron) 2 MG tablet Take 1-2 mg by mouth 3 (three) times daily as needed for sleep or anxiety.    Yes Historical Provider, MD  amphetamine-dextroamphetamine (ADDERALL) 30 MG tablet Take 60 mg by mouth daily.    Yes Historical Provider, MD  aspirin 325 MG  tablet Take 1 tablet (325 mg total) by mouth daily. For heart health 09/05/15  Yes Encarnacion Slates, NP  citalopram (CELEXA) 20 MG tablet Take 1 tablet (20 mg total) by mouth every morning. Patient taking differently: Take 20 mg by mouth daily.  10/15/15  Yes Elsie Stain, MD  cloNIDine (CATAPRES) 0.1 MG tablet Take 2 tablets (0.2 mg total) by mouth 2 (two) times daily. 10/22/15  Yes Tresa Garter, MD  divalproex (DEPAKOTE) 500 MG DR tablet TAKE 1 TABLET BY MOUTH 2 TIMES DAILY. 03/03/16  Yes Tresa Garter, MD  Fluticasone-Salmeterol (ADVAIR) 250-50 MCG/DOSE AEPB Inhale 1 puff into the lungs 2 (two) times daily.   Yes Historical Provider, MD  gabapentin (NEURONTIN) 800 MG tablet Take 800 mg by mouth 3 (three) times daily. 12/16/15  Yes Historical Provider, MD  insulin glargine (LANTUS) 100 UNIT/ML injection Inject 0.2 mLs (20 Units total) into the skin at bedtime. 10/15/15  Yes Elsie Stain, MD  metFORMIN (GLUCOPHAGE) 1000 MG tablet Take 1 tablet (1,000 mg total) by mouth 2 (two) times daily. 10/15/15  Yes Elsie Stain, MD  omeprazole (PRILOSEC) 40 MG capsule Take 1 capsule (40 mg total) by mouth 2 (two) times daily. 10/15/15  Yes Elsie Stain, MD  oxycodone (ROXICODONE) 30 MG immediate release tablet Take 30 mg by mouth 5 (five) times daily as needed for pain.   Yes Historical Provider, MD  ranitidine (ZANTAC) 300 MG tablet Take 1 tablet (300 mg total) by mouth at bedtime. 10/15/15  Yes Elsie Stain, MD  traZODone (DESYREL) 50 MG tablet Take 1 tablet (50 mg total) by mouth at bedtime. For sleep 10/15/15  Yes Elsie Stain, MD  ACCU-CHEK [facility] LANCETS lancets Use as instructed for 3 times daily testing of blood sugar 11/10/15   Tresa Garter, MD  acetaminophen-codeine (TYLENOL #3) 300-30 MG tablet Take 1 tablet by mouth every 4 (four) hours as needed. Patient not taking: Reported on 06/19/2016 03/04/16   Tresa Garter, MD  albuterol (PROVENTIL HFA;VENTOLIN HFA) 108 (90  Base) MCG/ACT inhaler Inhale 2 puffs into the lungs every 2 (two) hours as needed for wheezing or shortness of breath. Patient not taking: Reported on 01/03/2016 10/27/15   Arnoldo Morale, MD  blood glucose meter kit and supplies KIT Dispense based on patient and insurance preference. Use up to four times daily as directed. (FOR ICD-9 250.00, 250.01). 10/13/15   Samuella Cota, MD  Blood Glucose Monitoring Suppl (ACCU-CHEK AVIVA PLUS) w/Device KIT Use as directed to check blood sugar 3 times daily. 11/10/15   Tresa Garter, MD  gabapentin (NEURONTIN) 400 MG capsule Take 2 capsules (800 mg total) by mouth 3 (three) times daily. Patient not taking: Reported on 01/03/2016 10/15/15   Elsie Stain, MD  glucose blood (ACCU-CHEK AVIVA) test strip Use as instructed for 3 times daily testing of blood sugar 11/10/15   Tresa Garter, MD  Lancet Devices [facility]) lancets Use as instructed for 3 times daily testing of blood sugar 11/10/15  Tresa Garter, MD  PRESCRIPTION MEDICATION Inhale into the lungs at bedtime. BIPAP (VPAP)    Historical Provider, MD    Family History Family History  Problem Relation Age of Onset  . CAD Mother     Living  . Diabetes Mellitus II Mother   . Stroke Mother   . Hypertension Mother   . Congestive Heart Failure Mother   . Kidney disease Mother   . Fibromyalgia Mother   . Thyroid disease Mother   . Hyperlipidemia Mother   . Liver disease Mother   . Alcoholism Father 16    Deceased  . Arthritis Maternal Grandmother   . Congestive Heart Failure Maternal Grandmother   . Hypertension Maternal Grandmother   . Lung cancer Maternal Grandfather   . Colon cancer Maternal Aunt   . Stomach cancer Maternal Aunt   . Heart disease Other     Paternal & Maternal  . Crohn's disease Other   . Hypertension Other     Paternal & Maternal  . Hypertension Brother     x3  . Hypertension Sister     #1  . Bipolar disorder Sister     #1  . ADD / ADHD Son       x3  . Bipolar disorder Son     x3  . Asperger's syndrome Son     Social History Social History  Substance Use Topics  . Smoking status: Current Every Day Smoker    Packs/day: 0.50    Years: 23.00    Types: Cigarettes  . Smokeless tobacco: Never Used  . Alcohol use No     Allergies   Atenolol; Lisinopril; Prednisone; Ibuprofen; and Tylenol [acetaminophen]   Review of Systems Review of Systems  Constitutional: Negative for fever.  Respiratory: Positive for cough and shortness of breath.   All other systems reviewed and are negative.    Physical Exam Updated Vital Signs BP (!) 119/96   Pulse 68   Temp 98.5 F (36.9 C) (Oral)   Resp 11   SpO2 98%   Physical Exam  Constitutional: He is oriented to person, place, and time. He appears well-developed and well-nourished. No distress.  HENT:  Head: Normocephalic and atraumatic.  Right Ear: Hearing normal.  Left Ear: Hearing normal.  Nose: Nose normal.  Mouth/Throat: Oropharynx is clear and moist and mucous membranes are normal.  Eyes: Conjunctivae and EOM are normal. Pupils are equal, round, and reactive to light.  Neck: Normal range of motion. Neck supple.  Cardiovascular: Regular rhythm, S1 normal and S2 normal.  Exam reveals no gallop and no friction rub.   No murmur heard. Pulmonary/Chest: Effort normal. No respiratory distress. He has decreased breath sounds. He exhibits no tenderness.  Abdominal: Soft. Normal appearance and bowel sounds are normal. There is no hepatosplenomegaly. There is no tenderness. There is no rebound, no guarding, no tenderness at McBurney's point and negative Murphy's sign. No hernia.  Musculoskeletal: Normal range of motion.  Neurological: He is alert and oriented to person, place, and time. He has normal strength. No cranial nerve deficit or sensory deficit. Coordination normal. GCS eye subscore is 4. GCS verbal subscore is 5. GCS motor subscore is 6.  Skin: Skin is warm, dry and  intact. No rash noted. No cyanosis.  Psychiatric: He has a normal mood and affect. His speech is normal and behavior is normal. Thought content normal.  Nursing note and vitals reviewed.    ED Treatments / Results  DIAGNOSTIC STUDIES:  Oxygen  Saturation is 98% on RA, normal by my interpretation.    COORDINATION OF CARE:  12:48 AM Discussed treatment plan with pt at bedside and pt agreed to plan.  Labs (all labs ordered are listed, but only abnormal results are displayed) Labs Reviewed  COMPREHENSIVE METABOLIC PANEL - Abnormal; Notable for the following:       Result Value   Sodium 134 (*)    Chloride 100 (*)    Glucose, Bld 122 (*)    All other components within normal limits  CBC WITH DIFFERENTIAL/PLATELET  BRAIN NATRIURETIC PEPTIDE  TROPONIN I  D-DIMER, QUANTITATIVE (NOT AT [facility])    EKG  EKG Interpretation  Date/Time:  Saturday June 19 2016 00:35:40 EDT Ventricular Rate:  80 PR Interval:    QRS Duration: 90 QT Interval:  374 QTC Calculation: 432 R Axis:   94 Text Interpretation:  Sinus rhythm Borderline right axis deviation Otherwise within normal limits No significant change since last tracing Confirmed by POLLINA  MD, CHRISTOPHER [phone]) on 06/19/2016 12:43:05 AM       Radiology Dg Chest 2 View  Result Date: 06/19/2016 CLINICAL DATA:  Shortness of breath, chest pain, and cold sweats for 1 day. Productive cough. Hypertension, diabetes, smoker. EXAM: CHEST  2 VIEW COMPARISON:  01/05/2016 FINDINGS: The heart size and mediastinal contours are within normal limits. Both lungs are clear. The visualized skeletal structures are unremarkable. IMPRESSION: No active cardiopulmonary disease. Electronically Signed   By: Lucienne Capers M.D.   On: 06/19/2016 02:06   Ct Angio Chest Pe W Or Wo Contrast  Result Date: 06/19/2016 CLINICAL DATA:  Shortness of breath starting yesterday.  Chest pain. EXAM: CT ANGIOGRAPHY CHEST WITH CONTRAST TECHNIQUE: Multidetector CT imaging of  the chest was performed using the standard protocol during bolus administration of intravenous contrast. Multiplanar CT image reconstructions and MIPs were obtained to evaluate the vascular anatomy. CONTRAST:  100 mL Isovue 370. Patient's IV infiltrated at 50 mL. Technologist note indicates that nurse was notified and safety zone filled out. COMPARISON:  10/25/2015 FINDINGS: Cardiovascular: Suboptimal contrast bolus. Examination is indeterminate for evaluation for pulmonary embolus. Normal heart size. Normal caliber thoracic aorta. No pericardial effusion. Mediastinum/Nodes: No enlarged mediastinal, hilar, or axillary lymph nodes. Thyroid gland, trachea, and esophagus demonstrate no significant findings. Lungs/Pleura: Evaluation is limited due to motion artifact. No focal consolidation. No pleural effusions. No pneumothorax. There is a nodule in the right lung base measuring 6 mm diameter. No change since prior study. Upper Abdomen: No gross abnormalities identified although visualization is limited due technique. Musculoskeletal: Degenerative changes in the spine. No destructive bone lesions. Review of the MIP images confirms the above findings. IMPRESSION: Examination is indeterminate for evaluation of pulmonary embolus due to suboptimal contrast bolus. No evidence of active pulmonary disease. **An incidental finding of potential clinical significance has been found. 6 mm nodule demonstrated in the right lower lung, unchanged since prior study. Non-contrast chest CT at 18-24 months (from today's scan) is considered optional for low-risk patients, but is recommended for high-risk patients. This recommendation follows the consensus statement: Guidelines for Management of Incidental Pulmonary Nodules Detected on CT Images: From the Fleischner Society 2017; Radiology 2017; 284:228-243.** Electronically Signed   By: Lucienne Capers M.D.   On: 06/19/2016 06:07    Procedures Procedures (including critical care  time)  Medications Ordered in ED Medications  iopamidol (ISOVUE-370) 76 % injection (not administered)  ipratropium-albuterol (DUONEB) 0.5-2.5 (3) MG/3ML nebulizer solution 3 mL (3 mLs Nebulization Given 06/19/16 0232)  LORazepam (ATIVAN) injection 2 mg (2 mg Intravenous Given 06/19/16 0504)  sodium chloride 0.9 % bolus 500 mL (0 mLs Intravenous Stopped 06/19/16 0530)     Initial Impression / Assessment and Plan / ED Course  I have reviewed the triage vital signs and the nursing notes.  Pertinent labs & imaging results that were available during my care of the patient were reviewed by me and considered in my medical decision making (see chart for details).     Patient presents to the emergency department for evaluation of difficulty breathing. He has had cough productive of dark, black sputum. Symptoms ongoing for 2 days. Patient reports that he has a history of manic depression and has felt like maybe his manic behavior is returning. He has not been able to sleep 2 days. He feels very agitated and anxious.  Patient's workup is unrevealing. He has essentially normal workup. Cardiac evaluation negative. No signs of volume overload or congestive heart failure. No ischemia, infarct. Troponin negative. CBC normal. Chest x-ray does not show pneumonia, nor edema. BNP is 16.  Patient continues to feel anxious here in the ER. Was administered Ativan for his anxiety.   A CT angiography was performed to further evaluate lungs. Timing of the contrast bolus was poor, could not rule out PE. He d-dimer, however, is 0.5, considered normal. Patient is not tachycardic or hypoxic. PE is felt to be very low likelihood.  It appears that the patient's IV infiltrated while he was receiving the IV dye. Patient has been monitored and there is some edema of the area but no blistering or skin breakdown. Will monitor per radiology protocol, after which patient will be discharged.  Final Clinical Impressions(s) / ED  Diagnoses   Final diagnoses:  Shortness of breath  Anxiety    New Prescriptions New Prescriptions   No medications on file  I personally performed the services described in this documentation, which was scribed in my presence. The recorded information has been reviewed and is accurate.     Orpah Greek, MD 06/19/16 [phone]

## 2016-06-19 NOTE — ED Triage Notes (Signed)
Per [facility], Pt reports SHOB starting 4/20 at 10 am. EMS reports O2 sats of 92% on room air, EMS placed pt on 4L [place]. Pt's O2 sats 98% on room air. Pt reports chest pain when he breathes deep. Pt reports a cough with black sputum, none observed. Pt reports not sleeping for past 2 days. Pt received 324 of ASA and 1 nitro en route. Pt is alert, vital signs stable.

## 2016-06-19 NOTE — ED Notes (Signed)
Sent add on labels 

## 2016-06-19 NOTE — ED Notes (Signed)
Patient transported to CT 

## 2016-06-19 NOTE — ED Notes (Signed)
Patient transported to X-ray 

## 2016-06-19 NOTE — ED Notes (Signed)
Pt is very irritable and jumping up and saying turn this machine off I don't want it to beep. He keeps getting up and yelling out the door. He keeps saying I have anxiety issues. Security was asked to step in and ask him to stop.

## 2016-06-19 NOTE — ED Notes (Signed)
Blood at bedside not labeled.  Pt enroute to xray.  Will have nurse label blood and send to main lab.

## 2016-08-09 ENCOUNTER — Encounter (HOSPITAL_COMMUNITY): Payer: Self-pay | Admitting: Emergency Medicine

## 2016-08-09 ENCOUNTER — Emergency Department (HOSPITAL_COMMUNITY)
Admission: EM | Admit: 2016-08-09 | Discharge: 2016-08-10 | Disposition: A | Discharge status: Home / Self Care | Payer: Medicare Other | Attending: Emergency Medicine | Admitting: Emergency Medicine

## 2016-08-09 ENCOUNTER — Other Ambulatory Visit: Payer: Self-pay

## 2016-08-09 DIAGNOSIS — J45909 Unspecified asthma, uncomplicated: Secondary | ICD-10-CM | POA: Diagnosis not present

## 2016-08-09 DIAGNOSIS — Z794 Long term (current) use of insulin: Secondary | ICD-10-CM | POA: Diagnosis not present

## 2016-08-09 DIAGNOSIS — T426X2A Poisoning by other antiepileptic and sedative-hypnotic drugs, intentional self-harm, initial encounter: Secondary | ICD-10-CM | POA: Insufficient documentation

## 2016-08-09 DIAGNOSIS — T424X2A Poisoning by benzodiazepines, intentional self-harm, initial encounter: Secondary | ICD-10-CM | POA: Diagnosis not present

## 2016-08-09 DIAGNOSIS — R45851 Suicidal ideations: Secondary | ICD-10-CM

## 2016-08-09 DIAGNOSIS — Z79899 Other long term (current) drug therapy: Secondary | ICD-10-CM | POA: Diagnosis not present

## 2016-08-09 DIAGNOSIS — Z7982 Long term (current) use of aspirin: Secondary | ICD-10-CM | POA: Insufficient documentation

## 2016-08-09 DIAGNOSIS — I10 Essential (primary) hypertension: Secondary | ICD-10-CM | POA: Insufficient documentation

## 2016-08-09 DIAGNOSIS — F1920 Other psychoactive substance dependence, uncomplicated: Secondary | ICD-10-CM

## 2016-08-09 DIAGNOSIS — F1910 Other psychoactive substance abuse, uncomplicated: Secondary | ICD-10-CM | POA: Insufficient documentation

## 2016-08-09 DIAGNOSIS — T50902A Poisoning by unspecified drugs, medicaments and biological substances, intentional self-harm, initial encounter: Secondary | ICD-10-CM

## 2016-08-09 DIAGNOSIS — E119 Type 2 diabetes mellitus without complications: Secondary | ICD-10-CM | POA: Diagnosis not present

## 2016-08-09 DIAGNOSIS — T465X2A Poisoning by other antihypertensive drugs, intentional self-harm, initial encounter: Secondary | ICD-10-CM | POA: Insufficient documentation

## 2016-08-09 DIAGNOSIS — F1120 Opioid dependence, uncomplicated: Secondary | ICD-10-CM

## 2016-08-09 DIAGNOSIS — F17210 Nicotine dependence, cigarettes, uncomplicated: Secondary | ICD-10-CM | POA: Diagnosis not present

## 2016-08-09 LAB — COMPREHENSIVE METABOLIC PANEL
ALK PHOS: 62 U/L (ref 38–126)
ALT: 27 U/L (ref 17–63)
ANION GAP: 5 (ref 5–15)
AST: 26 U/L (ref 15–41)
Albumin: 3.6 g/dL (ref 3.5–5.0)
BILIRUBIN TOTAL: 0.4 mg/dL (ref 0.3–1.2)
BUN: 8 mg/dL (ref 6–20)
CALCIUM: 8.9 mg/dL (ref 8.9–10.3)
CO2: 30 mmol/L (ref 22–32)
CREATININE: 1.03 mg/dL (ref 0.61–1.24)
Chloride: 104 mmol/L (ref 101–111)
GFR calc non Af Amer: >60 mL/min (ref >60)
Glucose, Bld: 170 mg/dL — ABNORMAL HIGH (ref 65–99)
Potassium: 3.9 mmol/L (ref 3.5–5.1)
Sodium: 139 mmol/L (ref 135–145)
TOTAL PROTEIN: 7.3 g/dL (ref 6.5–8.1)

## 2016-08-09 LAB — CBC
HCT: 47.5 % (ref 39.0–52.0)
HEMOGLOBIN: 15.8 g/dL (ref 13.0–17.0)
MCH: 29.6 pg (ref 26.0–34.0)
MCHC: 33.3 g/dL (ref 30.0–36.0)
MCV: 89.1 fL (ref 78.0–100.0)
PLATELETS: 192 10*3/uL (ref 150–400)
RBC: 5.33 MIL/uL (ref 4.22–5.81)
RDW: 15.3 % (ref 11.5–15.5)
WBC: 8.8 10*3/uL (ref 4.0–10.5)

## 2016-08-09 LAB — ACETAMINOPHEN LEVEL

## 2016-08-09 LAB — CBG MONITORING, ED: Glucose-Capillary: 205 mg/dL — ABNORMAL HIGH (ref 65–99)

## 2016-08-09 LAB — SALICYLATE LEVEL

## 2016-08-09 LAB — ETHANOL: Alcohol, Ethyl (B): <5 mg/dL (ref <5)

## 2016-08-09 NOTE — ED Provider Notes (Addendum)
[place] DEPT Provider Note: Georgena Spurling, MD, FACEP  CSN: [id number] MRN: [id number] ARRIVAL: 08/09/16 at 2119 ROOM: Truchas Brunetti is a 41 y.o. male with a history of morbid obesity, polysubstance abuse, diabetes and bipolar disorder. He is here after taking an unspecified number of clonidine, gabapentin and Xanax tablets earlier this evening with the intention of going to sleep and never waking up. He gives the reason as having to deal with his son taking a drug overdose, his grandmother dying, and chronic gallbladder disease. He states that whenever he eats he has severe right upper quadrant pain and nausea. Patient has been somnolent but readily arousable.   Past Medical History:  Diagnosis Date  . Adult ADHD   . Anaphylactic reaction   . Anxiety   . Asthma   . Bipolar disorder ([place])   . Complication of anesthesia    Per patient difficult intubation;  . Diabetes mellitus   . Difficult intubation    Per patient  . Heart valve disorder    s/p echocardiogram  . Hyperlipidemia   . Hypertension   . Morbidly obese ([place])   . OSA (obstructive sleep apnea)   . Transient cerebral ischemia    Unknown    Past Surgical History:  Procedure Laterality Date  . CARPAL TUNNEL RELEASE    . ESOPHAGOGASTRODUODENOSCOPY N/A 04/05/2015   Procedure: ESOPHAGOGASTRODUODENOSCOPY (EGD);  Surgeon: Rogene Houston, MD;  [facility];  Service: Endoscopy;  Laterality: N/A;  . ESOPHAGOGASTRODUODENOSCOPY (EGD) WITH PROPOFOL N/A 05/21/2015   Procedure: ESOPHAGOGASTRODUODENOSCOPY (EGD) WITH PROPOFOL;  Surgeon: Ladene Artist, MD;  Location: [facility];  Service: Endoscopy;  Laterality: N/A;  . NOSE SURGERY    . TOOTH EXTRACTION    . WISDOM TOOTH EXTRACTION      Family History  Problem Relation Age of Onset  . CAD Mother        Living  . Diabetes Mellitus II Mother   . Stroke Mother   .  Hypertension Mother   . Congestive Heart Failure Mother   . Kidney disease Mother   . Fibromyalgia Mother   . Thyroid disease Mother   . Hyperlipidemia Mother   . Liver disease Mother   . Alcoholism Father 20       Deceased  . Arthritis Maternal Grandmother   . Congestive Heart Failure Maternal Grandmother   . Hypertension Maternal Grandmother   . Lung cancer Maternal Grandfather   . Colon cancer Maternal Aunt   . Stomach cancer Maternal Aunt   . Heart disease Other        Paternal & Maternal  . Crohn's disease Other   . Hypertension Other        Paternal & Maternal  . Hypertension Brother        x3  . Hypertension Sister        #1  . Bipolar disorder Sister        #1  . ADD / ADHD Son        x3  . Bipolar disorder Son        x3  . Asperger's syndrome Son     Social History  Substance Use Topics  . Smoking status: Current Every Day Smoker    Packs/day: 0.50    Years: 23.00    Types: Cigarettes  . Smokeless tobacco: Never Used  . Alcohol use No    Prior  to Admission medications   Medication Sig Start Date End Date Taking? Authorizing Provider  ACCU-CHEK SOFTCLIX LANCETS lancets Use as instructed for 3 times daily testing of blood sugar 11/10/15   Jegede, Olugbemiga E, MD  acetaminophen-codeine (TYLENOL #3) 300-30 MG tablet Take 1 tablet by mouth every 4 (four) hours as needed. Patient not taking: Reported on 06/19/2016 03/04/16   Tresa Garter, MD  albuterol (PROVENTIL HFA;VENTOLIN HFA) 108 (90 Base) MCG/ACT inhaler Inhale 2 puffs into the lungs every 2 (two) hours as needed for wheezing or shortness of breath. Patient not taking: Reported on 01/03/2016 10/27/15   Arnoldo Morale, MD  albuterol (PROVENTIL HFA;VENTOLIN HFA) 108 (90 Base) MCG/ACT inhaler Inhale 1-2 puffs into the lungs every 6 (six) hours as needed for wheezing or shortness of breath. 11/20/15   Delos Haring, PA-C  alprazolam Duanne Moron) 2 MG tablet Take 1-2 mg by mouth 3 (three) times daily as needed for  sleep or anxiety.     [provider]  amphetamine-dextroamphetamine (ADDERALL) 30 MG tablet Take 60 mg by mouth daily.     [provider]  aspirin 325 MG tablet Take 1 tablet (325 mg total) by mouth daily. For heart health 09/05/15   Lindell Spar I, NP  blood glucose meter kit and supplies KIT Dispense based on patient and insurance preference. Use up to four times daily as directed. (FOR ICD-9 250.00, 250.01). 10/13/15   Samuella Cota, MD  Blood Glucose Monitoring Suppl (ACCU-CHEK AVIVA PLUS) w/Device KIT Use as directed to check blood sugar 3 times daily. 11/10/15   Tresa Garter, MD  citalopram (CELEXA) 20 MG tablet Take 1 tablet (20 mg total) by mouth every morning. Patient taking differently: Take 20 mg by mouth daily.  10/15/15   Elsie Stain, MD  cloNIDine (CATAPRES) 0.1 MG tablet Take 2 tablets (0.2 mg total) by mouth 2 (two) times daily. 10/22/15   Tresa Garter, MD  divalproex (DEPAKOTE) 500 MG DR tablet TAKE 1 TABLET BY MOUTH 2 TIMES DAILY. 03/03/16   Tresa Garter, MD  Fluticasone-Salmeterol (ADVAIR) 250-50 MCG/DOSE AEPB Inhale 1 puff into the lungs 2 (two) times daily.    [provider]  gabapentin (NEURONTIN) 400 MG capsule Take 2 capsules (800 mg total) by mouth 3 (three) times daily. Patient not taking: Reported on 01/03/2016 10/15/15   Elsie Stain, MD  gabapentin (NEURONTIN) 800 MG tablet Take 800 mg by mouth 3 (three) times daily. 12/16/15   [provider]  glucose blood (ACCU-CHEK AVIVA) test strip Use as instructed for 3 times daily testing of blood sugar 11/10/15   Jegede, Olugbemiga E, MD  insulin glargine (LANTUS) 100 UNIT/ML injection Inject 0.2 mLs (20 Units total) into the skin at bedtime. 10/15/15   Elsie Stain, MD  Lancet Devices [facility]) lancets Use as instructed for 3 times daily testing of blood sugar 11/10/15   Jegede, Olugbemiga E, MD  metFORMIN (GLUCOPHAGE) 1000 MG tablet Take 1 tablet  (1,000 mg total) by mouth 2 (two) times daily. 10/15/15   Elsie Stain, MD  omeprazole (PRILOSEC) 40 MG capsule Take 1 capsule (40 mg total) by mouth 2 (two) times daily. 10/15/15   Elsie Stain, MD  oxycodone (ROXICODONE) 30 MG immediate release tablet Take 30 mg by mouth 5 (five) times daily as needed for pain.    [provider]  PRESCRIPTION MEDICATION Inhale into the lungs at bedtime. BIPAP (VPAP)    [provider]  ranitidine (  ZANTAC) 300 MG tablet Take 1 tablet (300 mg total) by mouth at bedtime. 10/15/15   Elsie Stain, MD  traZODone (DESYREL) 50 MG tablet Take 1 tablet (50 mg total) by mouth at bedtime. For sleep 10/15/15   Elsie Stain, MD    Allergies Atenolol; Lisinopril; Prednisone; Ibuprofen; and Tylenol [acetaminophen]   REVIEW OF SYSTEMS  Negative except as noted here or in the History of Present Illness.   PHYSICAL EXAMINATION  Initial Vital Signs Blood pressure (!) 159/90, pulse 99, temperature 98.4 F (36.9 C), temperature source Oral, resp. rate 17, height _0  (1.93 m), weight (!) 161 kg (355 lb), SpO2 95 %.  Examination General: Well-developed, morbidly obese male in no acute distress; appearance consistent with age of record HENT: normocephalic; atraumatic Eyes: pupils equal, round and reactive to light; extraocular muscles intact Neck: supple Heart: regular rate and rhythm Lungs: clear to auscultation bilaterally Abdomen: soft; obese; right upper quadrant tenderness; bowel sounds present Extremities: No deformity; full range of motion; pulses normal Neurologic: Somnolent but arousable; motor function intact in all extremities and symmetric; no facial droop Skin: Warm and dry; track marks on arms Psychiatric: Suicidal ideation   RESULTS  Summary of this visit's results, reviewed by myself:   EKG Interpretation  Date/Time:    Ventricular Rate:    PR Interval:    QRS Duration:   QT Interval:    QTC Calculation:   R  Axis:     Text Interpretation:        Laboratory Studies: Results for orders placed or performed during the hospital encounter of 08/09/16 (from the past 24 hour(s))  Comprehensive metabolic panel     Status: Abnormal   Collection Time: 08/09/16 10:32 PM  Result Value Ref Range   Sodium 139 135 - 145 mmol/L   Potassium 3.9 3.5 - 5.1 mmol/L   Chloride 104 101 - 111 mmol/L   CO2 30 22 - 32 mmol/L   Glucose, Bld 170 (H) 65 - 99 mg/dL   BUN 8 6 - 20 mg/dL   Creatinine, Ser 1.03 0.61 - 1.24 mg/dL   Calcium 8.9 8.9 - 10.3 mg/dL   Total Protein 7.3 6.5 - 8.1 g/dL   Albumin 3.6 3.5 - 5.0 g/dL   AST 26 15 - 41 U/L   ALT 27 17 - 63 U/L   Alkaline Phosphatase 62 38 - 126 U/L   Total Bilirubin 0.4 0.3 - 1.2 mg/dL   GFR calc non Af Amer >60 >60 mL/min   GFR calc Af Amer >60 >60 mL/min   Anion gap 5 5 - 15  Ethanol     Status: None   Collection Time: 08/09/16 10:32 PM  Result Value Ref Range   Alcohol, Ethyl (B) <5 <5 mg/dL  Salicylate level     Status: None   Collection Time: 08/09/16 10:32 PM  Result Value Ref Range   Salicylate Lvl <5.5 2.8 - 30.0 mg/dL  Acetaminophen level     Status: Abnormal   Collection Time: 08/09/16 10:32 PM  Result Value Ref Range   Acetaminophen (Tylenol), Serum <10 (L) 10 - 30 ug/mL  Rapid urine drug screen (hospital performed)     Status: Abnormal   Collection Time: 08/09/16 10:32 PM  Result Value Ref Range   Opiates POSITIVE (A) NONE DETECTED   Cocaine NONE DETECTED NONE DETECTED   Benzodiazepines POSITIVE (A) NONE DETECTED   Amphetamines POSITIVE (A) NONE DETECTED   Tetrahydrocannabinol NONE DETECTED NONE DETECTED  Barbiturates NONE DETECTED NONE DETECTED  CBG monitoring, ED     Status: Abnormal   Collection Time: 08/09/16 10:37 PM  Result Value Ref Range   Glucose-Capillary 205 (H) 65 - 99 mg/dL  Valproic acid level     Status: Abnormal   Collection Time: 08/09/16 11:06 PM  Result Value Ref Range   Valproic Acid Lvl <10 (L) 50.0 - 100.0 ug/mL   CBC     Status: None   Collection Time: 08/09/16 11:06 PM  Result Value Ref Range   WBC 8.8 4.0 - 10.5 K/uL   RBC 5.33 4.22 - 5.81 MIL/uL   Hemoglobin 15.8 13.0 - 17.0 g/dL   HCT 47.5 39.0 - 52.0 %   MCV 89.1 78.0 - 100.0 fL   MCH 29.6 26.0 - 34.0 pg   MCHC 33.3 30.0 - 36.0 g/dL   RDW 15.3 11.5 - 15.5 %   Platelets 192 150 - 400 K/uL   Imaging Studies: No results found.  ED COURSE  Nursing notes and initial vitals signs, including pulse oximetry, reviewed.  Vitals:   08/10/16 0134 08/10/16 0219 08/10/16 0300 08/10/16 0429  BP: 132/81 125/73 (!) 146/90 123/73  Pulse: 82 88 91 94  Resp: _0 Temp:      TempSrc:      SpO2: 94% 97% 97% 97%  Weight:      Height:       5:03 AM Patient observed for over 7 hours in the ED. Vital signs are stable. He remains somnolent but arousable.   PROCEDURES    ED DIAGNOSES     ICD-10-CM   1. Intentional drug overdose, initial encounter ([place]) T50.902A   2. Polysubstance abuse F19.10   3. Suicidal ideation R45.851        Shanon Rosser, MD 08/10/16 0055    Shanon Rosser, MD 08/10/16 [phone]

## 2016-08-09 NOTE — ED Triage Notes (Addendum)
Pt reports taking unknown number of clonidine, gabapentin & xnanax "to relieve stress." Pt snoring in triage.

## 2016-08-10 ENCOUNTER — Encounter (HOSPITAL_COMMUNITY): Payer: Self-pay

## 2016-08-10 ENCOUNTER — Emergency Department (HOSPITAL_COMMUNITY)
Admission: EM | Admit: 2016-08-10 | Discharge: 2016-08-12 | Disposition: A | Discharge status: Other Acute Inpatient Hospital | Payer: Medicare Other | Attending: Emergency Medicine | Admitting: Emergency Medicine

## 2016-08-10 DIAGNOSIS — J9622 Acute and chronic respiratory failure with hypercapnia: Secondary | ICD-10-CM | POA: Diagnosis not present

## 2016-08-10 DIAGNOSIS — Z794 Long term (current) use of insulin: Secondary | ICD-10-CM | POA: Diagnosis not present

## 2016-08-10 DIAGNOSIS — T50902A Poisoning by unspecified drugs, medicaments and biological substances, intentional self-harm, initial encounter: Secondary | ICD-10-CM

## 2016-08-10 DIAGNOSIS — F17210 Nicotine dependence, cigarettes, uncomplicated: Secondary | ICD-10-CM | POA: Diagnosis not present

## 2016-08-10 DIAGNOSIS — T50992A Poisoning by other drugs, medicaments and biological substances, intentional self-harm, initial encounter: Secondary | ICD-10-CM | POA: Insufficient documentation

## 2016-08-10 DIAGNOSIS — R45851 Suicidal ideations: Secondary | ICD-10-CM | POA: Diagnosis not present

## 2016-08-10 DIAGNOSIS — R55 Syncope and collapse: Secondary | ICD-10-CM | POA: Diagnosis not present

## 2016-08-10 DIAGNOSIS — Z7984 Long term (current) use of oral hypoglycemic drugs: Secondary | ICD-10-CM | POA: Insufficient documentation

## 2016-08-10 DIAGNOSIS — J811 Chronic pulmonary edema: Secondary | ICD-10-CM | POA: Diagnosis not present

## 2016-08-10 DIAGNOSIS — Z79899 Other long term (current) drug therapy: Secondary | ICD-10-CM | POA: Insufficient documentation

## 2016-08-10 DIAGNOSIS — F332 Major depressive disorder, recurrent severe without psychotic features: Secondary | ICD-10-CM | POA: Diagnosis not present

## 2016-08-10 DIAGNOSIS — I10 Essential (primary) hypertension: Secondary | ICD-10-CM | POA: Insufficient documentation

## 2016-08-10 DIAGNOSIS — Z8673 Personal history of transient ischemic attack (TIA), and cerebral infarction without residual deficits: Secondary | ICD-10-CM | POA: Insufficient documentation

## 2016-08-10 DIAGNOSIS — J45909 Unspecified asthma, uncomplicated: Secondary | ICD-10-CM | POA: Insufficient documentation

## 2016-08-10 DIAGNOSIS — E119 Type 2 diabetes mellitus without complications: Secondary | ICD-10-CM | POA: Diagnosis not present

## 2016-08-10 DIAGNOSIS — F1910 Other psychoactive substance abuse, uncomplicated: Secondary | ICD-10-CM | POA: Diagnosis not present

## 2016-08-10 LAB — CBC WITH DIFFERENTIAL/PLATELET
Basophils Absolute: 0 10*3/uL (ref 0.0–0.1)
Basophils Relative: 0 %
Eosinophils Absolute: 0.2 10*3/uL (ref 0.0–0.7)
Eosinophils Relative: 3 %
HCT: 46.2 % (ref 39.0–52.0)
Hemoglobin: 15 g/dL (ref 13.0–17.0)
Lymphocytes Relative: 36 %
Lymphs Abs: 2.9 10*3/uL (ref 0.7–4.0)
MCH: 29.2 pg (ref 26.0–34.0)
MCHC: 32.5 g/dL (ref 30.0–36.0)
MCV: 90.1 fL (ref 78.0–100.0)
Monocytes Absolute: 0.6 10*3/uL (ref 0.1–1.0)
Monocytes Relative: 7 %
Neutro Abs: 4.4 10*3/uL (ref 1.7–7.7)
Neutrophils Relative %: 54 %
Platelets: 205 10*3/uL (ref 150–400)
RBC: 5.13 MIL/uL (ref 4.22–5.81)
RDW: 15.3 % (ref 11.5–15.5)
WBC: 8.1 10*3/uL (ref 4.0–10.5)

## 2016-08-10 LAB — URINALYSIS, ROUTINE W REFLEX MICROSCOPIC
Bilirubin Urine: NEGATIVE
Glucose, UA: NEGATIVE mg/dL
Hgb urine dipstick: NEGATIVE
Ketones, ur: NEGATIVE mg/dL
Leukocytes, UA: NEGATIVE
Nitrite: NEGATIVE
Protein, ur: NEGATIVE mg/dL
Specific Gravity, Urine: 1.006 (ref 1.005–1.030)
pH: 6 (ref 5.0–8.0)

## 2016-08-10 LAB — ACETAMINOPHEN LEVEL: Acetaminophen (Tylenol), Serum: <10 ug/mL — ABNORMAL LOW (ref 10–30)

## 2016-08-10 LAB — RAPID URINE DRUG SCREEN, HOSP PERFORMED
Amphetamines: POSITIVE — AB
Amphetamines: POSITIVE — AB
Barbiturates: NOT DETECTED
Barbiturates: NOT DETECTED
Benzodiazepines: POSITIVE — AB
Benzodiazepines: POSITIVE — AB
Cocaine: NOT DETECTED
Cocaine: NOT DETECTED
OPIATES: POSITIVE — AB
Opiates: POSITIVE — AB
Tetrahydrocannabinol: NOT DETECTED
Tetrahydrocannabinol: NOT DETECTED

## 2016-08-10 LAB — LACTIC ACID, PLASMA: Lactic Acid, Venous: 1.4 mmol/L (ref 0.5–1.9)

## 2016-08-10 LAB — BASIC METABOLIC PANEL
Anion gap: 7 (ref 5–15)
BUN: 8 mg/dL (ref 6–20)
CO2: 26 mmol/L (ref 22–32)
Calcium: 8.7 mg/dL — ABNORMAL LOW (ref 8.9–10.3)
Chloride: 105 mmol/L (ref 101–111)
Creatinine, Ser: 0.75 mg/dL (ref 0.61–1.24)
GFR calc Af Amer: >60 mL/min (ref >60)
GFR calc non Af Amer: >60 mL/min (ref >60)
Glucose, Bld: 116 mg/dL — ABNORMAL HIGH (ref 65–99)
Potassium: 4.2 mmol/L (ref 3.5–5.1)
Sodium: 138 mmol/L (ref 135–145)

## 2016-08-10 LAB — SALICYLATE LEVEL: Salicylate Lvl: <7 mg/dL (ref 2.8–30.0)

## 2016-08-10 LAB — VALPROIC ACID LEVEL
Valproic Acid Lvl: <10 ug/mL — ABNORMAL LOW (ref 50.0–100.0)
Valproic Acid Lvl: <10 ug/mL — ABNORMAL LOW (ref 50.0–100.0)

## 2016-08-10 MED ORDER — FAMOTIDINE 20 MG PO TABS
20.0000 mg | ORAL_TABLET | Freq: Every day | ORAL | Status: DC
  Administered 2016-08-11 – 2016-08-12 (×2): 20 mg via ORAL
  Filled 2016-08-10 (×2): qty 1

## 2016-08-10 MED ORDER — STERILE WATER FOR INJECTION IJ SOLN
INTRAMUSCULAR | Status: AC
Start: 2016-08-10 — End: 2016-08-10
  Administered 2016-08-10: 10 mL
  Filled 2016-08-10: qty 10

## 2016-08-10 MED ORDER — INSULIN GLARGINE 100 UNIT/ML ~~LOC~~ SOLN: 20.0000 [IU] | Freq: Every day | SUBCUTANEOUS | Status: DC

## 2016-08-10 MED ORDER — ZIPRASIDONE MESYLATE 20 MG IM SOLR
INTRAMUSCULAR | Status: AC
  Administered 2016-08-10: 20 mg via INTRAMUSCULAR
  Filled 2016-08-10: qty 20

## 2016-08-10 MED ORDER — MOMETASONE FURO-FORMOTEROL FUM 200-5 MCG/ACT IN AERO
INHALATION_SPRAY | RESPIRATORY_TRACT | Status: AC
  Filled 2016-08-10: qty 8.8

## 2016-08-10 MED ORDER — ASPIRIN 325 MG PO TABS
325.0000 mg | ORAL_TABLET | Freq: Every day | ORAL | Status: DC
  Administered 2016-08-11 – 2016-08-12 (×2): 325 mg via ORAL
  Filled 2016-08-10 (×2): qty 1

## 2016-08-10 MED ORDER — ALBUTEROL SULFATE HFA 108 (90 BASE) MCG/ACT IN AERS: 1.0000 | INHALATION_SPRAY | Freq: Four times a day (QID) | RESPIRATORY_TRACT | Status: DC | PRN

## 2016-08-10 MED ORDER — ALUM & MAG HYDROXIDE-SIMETH 200-200-20 MG/5ML PO SUSP: 30.0000 mL | Freq: Four times a day (QID) | ORAL | Status: DC | PRN

## 2016-08-10 MED ORDER — METFORMIN HCL 500 MG PO TABS: 1000.0000 mg | ORAL_TABLET | Freq: Two times a day (BID) | ORAL | Status: DC

## 2016-08-10 MED ORDER — NALOXONE HCL 2 MG/2ML IJ SOSY
1.0000 mg | PREFILLED_SYRINGE | Freq: Once | INTRAMUSCULAR | Status: DC
  Filled 2016-08-10: qty 2

## 2016-08-10 MED ORDER — ZIPRASIDONE MESYLATE 20 MG IM SOLR
20.0000 mg | Freq: Once | INTRAMUSCULAR | Status: AC
  Administered 2016-08-10: 20 mg via INTRAMUSCULAR

## 2016-08-10 MED ORDER — INSULIN GLARGINE 100 UNIT/ML ~~LOC~~ SOLN
20.0000 [IU] | Freq: Every day | SUBCUTANEOUS | Status: DC
  Administered 2016-08-10 – 2016-08-11 (×2): 20 [IU] via SUBCUTANEOUS
  Filled 2016-08-10 (×3): qty 0.2

## 2016-08-10 MED ORDER — HYDROGEN PEROXIDE 3 % EX SOLN
CUTANEOUS | Status: AC
  Filled 2016-08-10: qty 473

## 2016-08-10 MED ORDER — CITALOPRAM HYDROBROMIDE 20 MG PO TABS
20.0000 mg | ORAL_TABLET | Freq: Every day | ORAL | Status: DC
  Administered 2016-08-11: 20 mg via ORAL
  Filled 2016-08-10 (×3): qty 1

## 2016-08-10 MED ORDER — PANTOPRAZOLE SODIUM 40 MG PO TBEC
40.0000 mg | DELAYED_RELEASE_TABLET | Freq: Every day | ORAL | Status: DC
  Administered 2016-08-11 – 2016-08-12 (×2): 40 mg via ORAL
  Filled 2016-08-10 (×2): qty 1

## 2016-08-10 MED ORDER — ONDANSETRON HCL 4 MG PO TABS: 4.0000 mg | ORAL_TABLET | Freq: Three times a day (TID) | ORAL | Status: DC | PRN

## 2016-08-10 MED ORDER — MOMETASONE FURO-FORMOTEROL FUM 200-5 MCG/ACT IN AERO
2.0000 | INHALATION_SPRAY | Freq: Two times a day (BID) | RESPIRATORY_TRACT | Status: DC
  Administered 2016-08-10 – 2016-08-12 (×4): 2 via RESPIRATORY_TRACT
  Filled 2016-08-10: qty 8.8

## 2016-08-10 MED ORDER — NICOTINE 21 MG/24HR TD PT24
21.0000 mg | MEDICATED_PATCH | Freq: Every day | TRANSDERMAL | Status: DC
Start: 2016-08-10 — End: 2016-08-10

## 2016-08-10 MED ORDER — DIVALPROEX SODIUM 250 MG PO DR TAB
500.0000 mg | DELAYED_RELEASE_TABLET | Freq: Two times a day (BID) | ORAL | Status: DC
  Administered 2016-08-10 – 2016-08-12 (×4): 500 mg via ORAL
  Filled 2016-08-10 (×4): qty 2

## 2016-08-10 MED ORDER — CITALOPRAM HYDROBROMIDE 10 MG PO TABS: 20.0000 mg | ORAL_TABLET | Freq: Every day | ORAL | Status: DC

## 2016-08-10 MED ORDER — METFORMIN HCL 500 MG PO TABS
1000.0000 mg | ORAL_TABLET | Freq: Two times a day (BID) | ORAL | Status: DC
  Administered 2016-08-10 – 2016-08-12 (×4): 1000 mg via ORAL
  Filled 2016-08-10 (×4): qty 2

## 2016-08-10 NOTE — ED Notes (Signed)
Pt is asleep, loudly snoring, sitter at bedside

## 2016-08-10 NOTE — BH Assessment (Signed)
Assessment Note  Bryan Gallegos is an 41 y.o. male. He is here after taking an unspecified number of clonidine, gabapentin and Xanax tablets earlier yesterday evening with the intention of going to sleep and never waking up. He gives the reason as having to deal with his son taking a drug overdose, his grandmother dying, and chronic gallbladder disease. Patient states "I've been in bed for a while and I don't think I can do anything and I don't think I'm worth anything, so I thought should I kill myself". Patient reports that he has had "seven or eight" attempts in the past. Patient states that he self injures by burning himself but cannot recall the frequency. Patient states that he last burned himself on September 27, 2015. Patient denies HI and history of aggression. Patient denies access to firearms or weapons. Patient has pending charges and a court date today. Patient denies that he is currently only probation. However, previously patient is on probation for possession of Benzodiazepines stating "they were my own Xanax." Patient denies AVH and does not appear to be responding to internal stimuli.  Patient denies current drugs and alcohol. Patient UDS + opiates, Amphetamines, and Benzodiazepines which patient reports are prescribed. Patient states that he has been prescribed Xanax and Depakote by his PCP and has Celexa from a previous hospital stay. Patient states that he takes all of his medications as prescribed. Patient contracts for safety but states "I just want something so my mind will stop going and I won't think about killing myself".   Diagnosis: Major Depressive Disorder, Recurrent, Severe, without psychotic features; Anxiety Disorder; Bipolar Disorder; Substance Use Disorder  Past Medical History:  Past Medical History:  Diagnosis Date  . Adult ADHD   . Anaphylactic reaction   . Anxiety   . Asthma   . Bipolar disorder ([facility])   . Complication of anesthesia    Per patient difficult  intubation;  . Diabetes mellitus   . Difficult intubation    Per patient  . Heart valve disorder    s/p echocardiogram  . Hyperlipidemia   . Hypertension   . Morbidly obese ([facility])   . OSA (obstructive sleep apnea)   . Transient cerebral ischemia    Unknown    Past Surgical History:  Procedure Laterality Date  . CARPAL TUNNEL RELEASE    . ESOPHAGOGASTRODUODENOSCOPY N/A 04/05/2015   Procedure: ESOPHAGOGASTRODUODENOSCOPY (EGD);  Surgeon: Malissa Hippo, MD;  [facility];  Service: Endoscopy;  Laterality: N/A;  . ESOPHAGOGASTRODUODENOSCOPY (EGD) WITH PROPOFOL N/A 05/21/2015   Procedure: ESOPHAGOGASTRODUODENOSCOPY (EGD) WITH PROPOFOL;  Surgeon: Meryl Dare, MD;  [facility];  Service: Endoscopy;  Laterality: N/A;  . NOSE SURGERY    . TOOTH EXTRACTION    . WISDOM TOOTH EXTRACTION      Family History:  Family History  Problem Relation Age of Onset  . CAD Mother        Living  . Diabetes Mellitus II Mother   . Stroke Mother   . Hypertension Mother   . Congestive Heart Failure Mother   . Kidney disease Mother   . Fibromyalgia Mother   . Thyroid disease Mother   . Hyperlipidemia Mother   . Liver disease Mother   . Alcoholism Father 80       Deceased  . Arthritis Maternal Grandmother   . Congestive Heart Failure Maternal Grandmother   . Hypertension Maternal Grandmother   . Lung cancer Maternal Grandfather   . Colon cancer Maternal  Aunt   . Stomach cancer Maternal Aunt   . Heart disease Other        Paternal & Maternal  . Crohn's disease Other   . Hypertension Other        Paternal & Maternal  . Hypertension Brother        x3  . Hypertension Sister        #1  . Bipolar disorder Sister        #1  . ADD / ADHD Son        x3  . Bipolar disorder Son        x3  . Asperger's syndrome Son     Social History:  reports that he has been smoking Cigarettes.  He has a 11.50 pack-year smoking history. He has never used smokeless tobacco. He reports  that he does not drink alcohol or use drugs.  Additional Social History:  Alcohol / Drug Use Pain Medications: Denies Prescriptions: Denies Over the Counter: Denies History of alcohol / drug use?: Yes (Patient denies current alcohol and drug use. His UDS is however + for Opiates, Benzo's, and Amphetamines.) Longest period of sobriety (when/how long): unknown Negative Consequences of Use: Financial, Legal, Personal relationships Withdrawal Symptoms: Fever / Chills, Cramps  CIWA: CIWA-Ar BP: 125/75 Pulse Rate: 87 COWS:    Allergies:  Allergies  Allergen Reactions  . Atenolol Anaphylaxis  . Lisinopril Anaphylaxis    NO ACE INHIBITORS!!!  . Prednisone Hives  . Ibuprofen Itching and Swelling    Hands and feet swelling  . Tylenol [Acetaminophen] Other (See Comments)    GI Bleed    Home Medications:  (Not in a hospital admission)  OB/GYN Status:  No LMP for male patient.  General Assessment Data [facility]: [facility] T[facility] Assessment: In system Is this a Tele or Face-to-Face Assessment?: Face-to-Face Is this an Initial Assessment or a Re-assessment for this encounter?: Initial Assessment Marital status: Single Maiden name:  (n/a) Is patient pregnant?: No Pregnancy Status: No Living Arrangements: Parent (lives with mother ) Can pt return to current living arrangement?: No Admission Status: Voluntary Is patient capable of signing voluntary admission?: Yes Referral Source: Self/Family/Friend Insurance type:  (MCR/MCD)     Crisis Care Plan Living Arrangements: Parent (lives with mother ) Legal Guardian: Other: (no legal guardian ) Name of Psychiatrist:  (no psychiatrist ) Name of Therapist: no therapist   Education Status Is patient currently in school?: No Current Grade:  (n/a) Highest grade of school patient has completed:  (some college ) Name of school:  (n/a) Contact person:  (n/a)  Risk to self with the past 6 months Suicidal Ideation: Yes-Currently  Present Has patient been a risk to self within the past 6 months prior to admission? : Yes Suicidal Intent: Yes-Currently Present Has patient had any suicidal intent within the past 6 months prior to admission? : Yes Is patient at risk for suicide?: No Suicidal Plan?: No Has patient had any suicidal plan within the past 6 months prior to admission? : Yes Access to Means: Yes Specify Access to Suicidal Means:  (medications ) What has been your use of drugs/alcohol within the last 12 months?:  (denies; UDS + for benzo's, amphetamines, opiates ) Previous Attempts/Gestures: No How many times?:  (0) Other Self Harm Risks:  (no self harm risks) Triggers for Past Attempts:  ("all kind of stuff..family issues.[place] Kitcheneverything") Intentional Self Injurious Behavior: None Family Suicide History: No Recent stressful life event(s): Other (Comment) (son overdosed yesterday,  mother is ill, "grandmother dying") Persecutory voices/beliefs?: No Depression: Yes Substance abuse history and/or treatment for substance abuse?: No Suicide prevention information given to non-admitted patients: Not applicable  Risk to Others within the past 6 months Homicidal Ideation: No Does patient have any lifetime risk of violence toward others beyond the six months prior to admission? : No Thoughts of Harm to Others: No Current Homicidal Intent: No Current Homicidal Plan: No Access to Homicidal Means: No Identified Victim:  (n/a) History of harm to others?: No Assessment of Violence: None Noted Violent Behavior Description:  (patient is calm and cooperative ) Does patient have access to weapons?: No Criminal Charges Pending?: Yes Describe Pending Criminal Charges:  ("traffic stuff") Does patient have a court date: Yes Court Date:  (yes..today at 9am) Is patient on probation?: No  Psychosis Hallucinations: None noted Delusions: None noted  Mental Status Report Appearance/Hygiene: Disheveled Eye Contact:  Good Motor Activity: Freedom of movement Speech: Logical/coherent Level of Consciousness: Alert Mood: Depressed Affect: Appropriate to circumstance Anxiety Level: None Thought Processes: Relevant, Coherent Judgement: Impaired Orientation: Person, Place, Time, Situation Obsessive Compulsive Thoughts/Behaviors: None  Cognitive Functioning Concentration: Decreased Memory: Recent Intact, Remote Intact IQ: Average Impulse Control: Fair Appetite: Fair Weight Loss:  (none reported) Weight Gain:  (none reported) Sleep: Decreased Total Hours of Sleep:  (varies ) Vegetative Symptoms:  (on-going )  ADLScreening [facility] Assessment Services) Patient's cognitive ability adequate to safely complete daily activities?: Yes Patient able to express need for assistance with ADLs?: Yes Independently performs ADLs?: Yes (appropriate for developmental age)  Prior Inpatient Therapy Prior Inpatient Therapy: No Prior Therapy Dates:  (n/a) Prior Therapy Facilty/Provider(s):  (n/a) Reason for Treatment:  (n/a)  Prior Outpatient Therapy Prior Outpatient Therapy: No Prior Therapy Dates:  (n/a) Prior Therapy Facilty/Provider(s):  (n/a) Reason for Treatment:  (n/a) Does patient have an ACCT team?: No Does patient have Intensive In-House Services?  : No Does patient have [facility] services? : No Does patient have P4CC services?: No  ADL Screening (condition at time of admission) Patient's cognitive ability adequate to safely complete daily activities?: Yes Is the patient deaf or have difficulty hearing?: No Does the patient have difficulty seeing, even when wearing glasses/contacts?: No Does the patient have difficulty concentrating, remembering, or making decisions?: No Patient able to express need for assistance with ADLs?: Yes Does the patient have difficulty dressing or bathing?: No Independently performs ADLs?: Yes (appropriate for developmental age) Weakness of Legs: None Weakness of  Arms/Hands: None  Home Assistive Devices/Equipment Home Assistive Devices/Equipment: None    Abuse/Neglect Assessment (Assessment to be complete while patient is alone) Physical Abuse: Denies Verbal Abuse: Denies Sexual Abuse: Denies Exploitation of patient/patient's resources: Denies Self-Neglect: Denies Values / Beliefs Cultural Requests During Hospitalization: None Spiritual Requests During Hospitalization: None   Advance Directives (For Healthcare) Does Patient Have a Medical Advance Directive?: No Would patient like information on creating a medical advance directive?: No - Patient declined Nutrition Screen- [facility] Patient's home diet: Regular  Additional Information 1:1 In Past 12 Months?: No CIRT Risk: No Elopement Risk: No Does patient have medical clearance?: Yes     Disposition:  Disposition Initial Assessment Completed for this Encounter: Yes Disposition of Patient: Other dispositions, Outpatient treatment (Discharge per Dr. Jannifer FranklinAkintayo and Nanine MeansJamison Lord, DNP) Type of outpatient treatment: Adult ([facility] )  On Site Evaluation by:   Reviewed with Physician:    Melynda Rippleoyka Deiondre Harrower 08/10/2016 10:29 AM

## 2016-08-10 NOTE — BHH Suicide Risk Assessment (Signed)
Suicide Risk Assessment  Discharge Assessment   [facility] Discharge Suicide Risk Assessment   Principal Problem: Polysubstance dependence including opioid type drug, episodic abuse [facility]) Discharge Diagnoses:  Patient Active Problem List   Diagnosis Date Noted  . Polysubstance dependence including opioid type drug, episodic abuse (HCC) [F11.20, F19.20] 09/04/2015    Priority: High  . Acute and chronic respiratory failure with hypercapnia (HCC) [J96.22]   . Pulmonary edema [J81.1]   . Syncope [R55]   . Respiratory failure (HCC) [J96.90] 01/04/2016  . Drug overdose [T50.901A]   . Acute encephalopathy [G93.40] 01/01/2016  . Hyponatremia [E87.1] 01/01/2016  . Chronic pain syndrome [G89.4] 10/22/2015  . Healthcare maintenance [Z00.00] 10/22/2015  . Hoarse voice quality [R49.0] 10/11/2015  . Major depressive disorder, recurrent severe without psychotic features (HCC) [F33.2] 09/04/2015  . RUQ abdominal pain [R10.11]   . Cholelithiasis [K80.20] 05/17/2015  . Antisocial personality disorder [F60.2] 04/11/2015  . Opioid use disorder, severe, dependence (HCC) [F11.20] 04/11/2015  . Benzodiazepine dependence (HCC) [F13.20] 04/11/2015  . Tobacco use disorder [F17.200] 04/10/2015  . Narcotic dependency, continuous (HCC) [F11.20] 03/14/2014  . Chronic back pain greater than 3 months duration [M54.9, G89.29] 01/28/2014  . Gastroesophageal reflux disease with esophagitis [K21.0] 01/28/2014  . Essential hypertension, benign [I10] 01/28/2014  . Diabetes mellitus type II, controlled (HCC) [E11.9] 01/28/2014  . Morbid obesity (HCC) [E66.01] 01/28/2014  . TIA (transient ischemic attack) [G45.9] 09/02/2013  . HLD (hyperlipidemia) [E78.5] 08/06/2013  . OSA on CPAP [G47.33, Z99.89] 08/06/2013    Total Time spent with patient: 45 minutes  Musculoskeletal: Strength & Muscle Tone: within normal limits Gait & Station: normal Patient leans: N/A  Psychiatric Specialty Exam:   Blood pressure 125/77, pulse  84, temperature 98.4 F (36.9 C), temperature source Oral, resp. rate 12, height 6\' 4"  (1.93 m), weight (!) 161 kg (355 lb), SpO2 97 %.Body mass index is 43.21 kg/m.  General Appearance: Disheveled  Eye Contact::  Good  Speech:  Normal Rate409  Volume:  Normal  Mood:  Irritable  Affect:  Congruent  Thought Process:  Coherent and Descriptions of Associations: Intact  Orientation:  Full (Time, Place, and Person)  Thought Content:  WDL and Logical  Suicidal Thoughts:  No  Homicidal Thoughts:  No  Memory:  Immediate;   Good Recent;   Good Remote;   Good  Judgement:  Fair  Insight:  Fair  Psychomotor Activity:  Normal  Concentration:  Good  Recall:  Good  Fund of Knowledge:Fair  Language: Good  Akathisia:  No  Handed:  Right  AIMS (if indicated):     Assets:  Housing Leisure Time Physical Health Resilience Social Support  Sleep:     Cognition: WNL  ADL's:  Intact   Mental Status Per Nursing Assessment::   On Admission:   Polysubstance abuse, court date today and would like a letter to excuse him from driving without a license.  Upset when we told him we do not do this.  Irritable but no suicidal/homicidal ideations, hallucinations, or withdrawal symptoms from his recent substance abuse.  Referred to his outpatient providers at [facility].  Demographic Factors:  Male and Caucasian  Loss Factors: Legal issues  Historical Factors: NA  Risk Reduction Factors:   Sense of responsibility to family, Living with another person, especially a relative, Positive social support and Positive therapeutic relationship  Continued Clinical Symptoms:  Irritability  Cognitive Features That Contribute To Risk:  None    Suicide Risk:  Minimal: No identifiable suicidal ideation.  Patients  presenting with no risk factors but with morbid ruminations; may be classified as minimal risk based on the severity of the depressive symptoms    Plan Of Care/Follow-up recommendations:  Activity:   as tolerated Diet:  heart healhty diet  Lisabeth Mian, NP 08/10/2016, 10:32 AM

## 2016-08-10 NOTE — BH Assessment (Signed)
BHH Assessment Progress Note  Per Mojeed Akintayo, Md, this pt does not require psychiatric hospitalization at this time.  [facility].  Discharge instructions recommend that pt follow up with [facility].  Pt's nurse has been notified.  Bryan Canninghomas Amilio Zehnder, MA Triage Specialist [phone]

## 2016-08-10 NOTE — ED Notes (Signed)
MD at bedside. 

## 2016-08-10 NOTE — ED Notes (Signed)
[place] source [place] during earlier episode after he removed his IV.  Per hospital policy blood was drawn for an exposure panel on this patient.  Almira CoasterGina, [facility] notified.

## 2016-08-10 NOTE — ED Notes (Signed)
Police placed shackles on pt's leg and he suddenly became very responsive and combative.  Pt jerked IV out of right arm before being controlled.  Began cursing and threatening staff and had to be restrained by multiple officers and staff.  States he will kill officers and staff when he gets out of handcuffs.  Dr. Juleen ChinaKohut at bedside speaking with patient.  Pt continues to argue with MD, staff, and officers regarding being involuntarily here at the hospital.

## 2016-08-10 NOTE — ED Notes (Signed)
Per Jessie Footoyka with TTS, patient is going to be discharged after seen by Dr Mervyn SkeetersA and Catha NottinghamJamison.

## 2016-08-10 NOTE — ED Notes (Signed)
Pt still resting w/ snoring respirations. Pt remains on cardiac monitor, vitals WNL. Sitter at bedside & [employer] in hallway. Pt belongings placed in locker & all room supplies locked in cabinets. TTS called to conduct interview. Was advised pt still sleeping after receiving geodon. TTS ask to reorder TTS when pt is awake enough to do so.

## 2016-08-10 NOTE — ED Notes (Signed)
Per Dr A and Catha NottMervyn SkeetersinghamJamison with TTS, patient good for discharge and can print AVS

## 2016-08-10 NOTE — Discharge Instructions (Signed)
For your ongoing behavioral health needs, you are advised to follow up with [facility].  Contact them at your earliest opportunity to ask about scheduling an intake appointment: ° °     [facility] °     [address] °     [place], [place] [address] °     [phone] °

## 2016-08-10 NOTE — ED Triage Notes (Signed)
Pt brought in taking an unknown number of clinidine, gabapentin, xanax. Pt.'s family called EMS. Pt was brought in unresponsive.

## 2016-08-10 NOTE — ED Provider Notes (Signed)
[place] DEPT Provider Note   CSN: [id number] Arrival date & time: 08/10/16  1411     History   Chief Complaint Chief Complaint  Patient presents with  . Drug Overdose    HPI Bryan Gallegos is a 41 y.o. male.  HPI   61yM with intentional overdose. Awake and talking on EMS arrival. Somnolent and snoring in route. Apparently took nasal airway without reacting but began kicking and cursing when I tickled his feet when he arrived to the ED. "Leave me alone. I already told you what I took." When asked if he was aware of where he was he replied "The exact place I didn't want to come. [facility] in [place]." Generally uncooperative and unwilling to give much history. Did also tell me he took clonidine, gabapentin and "blood pressure pills." Did say he was trying to kill himself. He couldn't or wouldn't quantify how much he took. Says he doesn't drink or do drugs.  Of note, pt was just seen in the ED overnight for similar reasons and discharged just a few hours ago with outpt resources.   Past Medical History:  Diagnosis Date  . Adult ADHD   . Anaphylactic reaction   . Anxiety   . Asthma   . Bipolar disorder ([place])   . Complication of anesthesia    Per patient difficult intubation;  . Diabetes mellitus   . Difficult intubation    Per patient  . Heart valve disorder    s/p echocardiogram  . Hyperlipidemia   . Hypertension   . Morbidly obese ([place])   . OSA (obstructive sleep apnea)   . Transient cerebral ischemia    Unknown    Patient Active Problem List   Diagnosis Date Noted  . Acute and chronic respiratory failure with hypercapnia ([place])   . Pulmonary edema   . Syncope   . Respiratory failure ([place]) 01/04/2016  . Drug overdose   . Acute encephalopathy 01/01/2016  . Hyponatremia 01/01/2016  . Chronic pain syndrome 10/22/2015  . Healthcare maintenance 10/22/2015  . Hoarse voice quality 10/11/2015  . Major depressive disorder, recurrent severe without  psychotic features ([place]) 09/04/2015  . Polysubstance dependence including opioid type drug, episodic abuse ([place]) 09/04/2015  . RUQ abdominal pain   . Cholelithiasis 05/17/2015  . Antisocial personality disorder 04/11/2015  . Opioid use disorder, severe, dependence ([place]) 04/11/2015  . Benzodiazepine dependence ([place]) 04/11/2015  . Tobacco use disorder 04/10/2015  . Narcotic dependency, continuous ([place]) 03/14/2014  . Chronic back pain greater than 3 months duration 01/28/2014  . Gastroesophageal reflux disease with esophagitis 01/28/2014  . Essential hypertension, benign 01/28/2014  . Diabetes mellitus type II, controlled ([place]) 01/28/2014  . Morbid obesity ([place]) 01/28/2014  . TIA (transient ischemic attack) 09/02/2013  . HLD (hyperlipidemia) 08/06/2013  . OSA on CPAP 08/06/2013    Past Surgical History:  Procedure Laterality Date  . CARPAL TUNNEL RELEASE    . ESOPHAGOGASTRODUODENOSCOPY N/A 04/05/2015   Procedure: ESOPHAGOGASTRODUODENOSCOPY (EGD);  Surgeon: Rogene Houston, MD;  Location: [facility];  Service: Endoscopy;  Laterality: N/A;  . ESOPHAGOGASTRODUODENOSCOPY (EGD) WITH PROPOFOL N/A 05/21/2015   Procedure: ESOPHAGOGASTRODUODENOSCOPY (EGD) WITH PROPOFOL;  Surgeon: Ladene Artist, MD;  Location: [facility] ENDOSCOPY;  Service: Endoscopy;  Laterality: N/A;  . NOSE SURGERY    . TOOTH EXTRACTION    . WISDOM TOOTH EXTRACTION         Home Medications    Prior to Admission medications   Medication Sig Start Date End Date  Taking? Authorizing Provider  ACCU-CHEK SOFTCLIX LANCETS lancets Use as instructed for 3 times daily testing of blood sugar 11/10/15   Jegede, Marlena Clipper, MD  albuterol (PROVENTIL HFA;VENTOLIN HFA) 108 (90 Base) MCG/ACT inhaler Inhale 1-2 puffs into the lungs every 6 (six) hours as needed for wheezing or shortness of breath. 11/20/15   Delos Haring, PA-C  alprazolam Duanne Moron) 2 MG tablet Take 1-2 mg by mouth 3 (three) times daily as needed for sleep or anxiety.      [provider]  amphetamine-dextroamphetamine (ADDERALL) 30 MG tablet Take 60 mg by mouth daily.     [provider]  aspirin 325 MG tablet Take 1 tablet (325 mg total) by mouth daily. For heart health 09/05/15   Lindell Spar I, NP  blood glucose meter kit and supplies KIT Dispense based on patient and insurance preference. Use up to four times daily as directed. (FOR ICD-9 250.00, 250.01). 10/13/15   Samuella Cota, MD  Blood Glucose Monitoring Suppl (ACCU-CHEK AVIVA PLUS) w/Device KIT Use as directed to check blood sugar 3 times daily. 11/10/15   Tresa Garter, MD  citalopram (CELEXA) 20 MG tablet Take 1 tablet (20 mg total) by mouth every morning. Patient taking differently: Take 20 mg by mouth daily.  10/15/15   Elsie Stain, MD  cloNIDine (CATAPRES) 0.1 MG tablet Take 2 tablets (0.2 mg total) by mouth 2 (two) times daily. 10/22/15   Tresa Garter, MD  divalproex (DEPAKOTE) 500 MG DR tablet TAKE 1 TABLET BY MOUTH 2 TIMES DAILY. 03/03/16   Tresa Garter, MD  Fluticasone-Salmeterol (ADVAIR) 250-50 MCG/DOSE AEPB Inhale 1 puff into the lungs 2 (two) times daily.    [provider]  gabapentin (NEURONTIN) 800 MG tablet Take 800 mg by mouth 3 (three) times daily. 12/16/15   [provider]  glucose blood (ACCU-CHEK AVIVA) test strip Use as instructed for 3 times daily testing of blood sugar 11/10/15   Jegede, Olugbemiga E, MD  insulin glargine (LANTUS) 100 UNIT/ML injection Inject 0.2 mLs (20 Units total) into the skin at bedtime. 10/15/15   Elsie Stain, MD  Lancet Devices [facility]) lancets Use as instructed for 3 times daily testing of blood sugar 11/10/15   Jegede, Olugbemiga E, MD  metFORMIN (GLUCOPHAGE) 1000 MG tablet Take 1 tablet (1,000 mg total) by mouth 2 (two) times daily. 10/15/15   Elsie Stain, MD  omeprazole (PRILOSEC) 40 MG capsule Take 1 capsule (40 mg total) by mouth 2 (two) times daily. 10/15/15   Elsie Stain, MD  PRESCRIPTION MEDICATION Inhale into the lungs at bedtime. BIPAP (VPAP)    [provider]  ranitidine (ZANTAC) 300 MG tablet Take 1 tablet (300 mg total) by mouth at bedtime. 10/15/15   Elsie Stain, MD  traZODone (DESYREL) 50 MG tablet Take 1 tablet (50 mg total) by mouth at bedtime. For sleep 10/15/15   Elsie Stain, MD    Family History Family History  Problem Relation Age of Onset  . CAD Mother        Living  . Diabetes Mellitus II Mother   . Stroke Mother   . Hypertension Mother   . Congestive Heart Failure Mother   . Kidney disease Mother   . Fibromyalgia Mother   . Thyroid disease Mother   . Hyperlipidemia Mother   . Liver disease Mother   . Alcoholism Father 84       Deceased  . Arthritis Maternal Grandmother   .  Congestive Heart Failure Maternal Grandmother   . Hypertension Maternal Grandmother   . Lung cancer Maternal Grandfather   . Colon cancer Maternal Aunt   . Stomach cancer Maternal Aunt   . Heart disease Other        Paternal & Maternal  . Crohn's disease Other   . Hypertension Other        Paternal & Maternal  . Hypertension Brother        x3  . Hypertension Sister        #1  . Bipolar disorder Sister        #1  . ADD / ADHD Son        x3  . Bipolar disorder Son        x3  . Asperger's syndrome Son     Social History Social History  Substance Use Topics  . Smoking status: Current Every Day Smoker    Packs/day: 0.50    Years: 23.00    Types: Cigarettes  . Smokeless tobacco: Never Used  . Alcohol use No     Allergies   Atenolol; Lisinopril; Prednisone; Ibuprofen; and Tylenol [acetaminophen]   Review of Systems Review of Systems  Level 5 caveat because he is selectively responsive to questioning.   Physical Exam Updated Vital Signs There were no vitals taken for this visit.  Physical Exam  Constitutional: No distress.  Laying in bed. Obese. Nasal airway. Snoring. NAD.   HENT:  Head: Normocephalic  and atraumatic.  Eyes: Conjunctivae are normal. Right eye exhibits no discharge. Left eye exhibits no discharge.  Neck: Neck supple.  Cardiovascular: Normal rate, regular rhythm and normal heart sounds.  Exam reveals no gallop and no friction rub.   No murmur heard. Pulmonary/Chest: Effort normal and breath sounds normal. No respiratory distress.  Abdominal: Soft. He exhibits no distension. There is no tenderness.  Musculoskeletal: He exhibits no edema or tenderness.  Numerous superficial linear abrasions to b/l forearms.   Neurological:  Snoring and seemingly unresponsive to voice and sternal rub but then yelling, purposeful and actually answering questions pretty appropriately.   Skin: Skin is warm and dry.  Nursing note and vitals reviewed.    ED Treatments / Results  Labs (all labs ordered are listed, but only abnormal results are displayed) Labs Reviewed  ACETAMINOPHEN LEVEL - Abnormal; Notable for the following:       Result Value   Acetaminophen (Tylenol), Serum <10 (*)    All other components within normal limits  RAPID URINE DRUG SCREEN, HOSP PERFORMED - Abnormal; Notable for the following:    Opiates POSITIVE (*)    Benzodiazepines POSITIVE (*)    Amphetamines POSITIVE (*)    All other components within normal limits  VALPROIC ACID LEVEL - Abnormal; Notable for the following:    Valproic Acid Lvl <10 (*)    All other components within normal limits  BASIC METABOLIC PANEL - Abnormal; Notable for the following:    Glucose, Bld 116 (*)    Calcium 8.7 (*)    All other components within normal limits  CBC WITH DIFFERENTIAL/PLATELET  SALICYLATE LEVEL  URINALYSIS, ROUTINE W REFLEX MICROSCOPIC  LACTIC ACID, PLASMA    EKG  EKG Interpretation  Date/Time:  Tuesday August 10 2016 14:15:05 EDT Ventricular Rate:  78 PR Interval:    QRS Duration: 85 QT Interval:  332 QTC Calculation: 379 R Axis:   94 Text Interpretation:  Sinus rhythm Borderline right axis deviation ST  elev, probable normal early repol  pattern Confirmed by Wilson Singer  MD, [place] [phone]) on 08/10/2016 4:59:06 PM       Radiology No results found.  Procedures Procedures (including critical care time)  Medications Ordered in ED Medications  naloxone (NARCAN) injection 1 mg (1 mg Intravenous Not Given 08/10/16 1454)  sterile water (preservative free) injection (10 mLs  Given 08/10/16 1453)  ziprasidone (GEODON) injection 20 mg (20 mg Intramuscular Given 08/10/16 1453)     Initial Impression / Assessment and Plan / ED Course  I have reviewed the triage vital signs and the nursing notes.  Pertinent labs & imaging results that were available during my care of the patient were reviewed by me and considered in my medical decision making (see chart for details).    2:54 PM Called to room by nursing. On arrival, pt cuffed to gurney and needing multiple law enforcement officers to restrain him because of combativeness. Geodon ordered for chemical restraint. Pt pulled out his IV access. Removing monitoring equipment. I felt law enforcement was extremely patient in explaining to him the implied consent for treatment when he was seemingly unresponsive and I re-iterated this as well and that I am now involuntarily committing him.   Pt intermittently drowsy but awakens and answering questions appropriately. Remains HD stable. Medically cleared.   Final Clinical Impressions(s) / ED Diagnoses   Final diagnoses:  Intentional drug overdose, initial encounter [facility])    New Prescriptions New Prescriptions   No medications on file     Virgel Manifold, MD 08/10/16 2200

## 2016-08-10 NOTE — Progress Notes (Signed)
Attempted TTS pt. Was non-responsive , UTA at this time Stesha Neyens K. Sherlon HandingHarris, LCAS-A, LPC-A, [facility]  Counselor 08/10/2016 5:38 AM

## 2016-08-10 NOTE — ED Notes (Signed)
Pt responding when spoken to. EDP reordering TTS.

## 2016-08-11 DIAGNOSIS — F17210 Nicotine dependence, cigarettes, uncomplicated: Secondary | ICD-10-CM

## 2016-08-11 DIAGNOSIS — J9622 Acute and chronic respiratory failure with hypercapnia: Secondary | ICD-10-CM

## 2016-08-11 DIAGNOSIS — J811 Chronic pulmonary edema: Secondary | ICD-10-CM

## 2016-08-11 DIAGNOSIS — T50992A Poisoning by other drugs, medicaments and biological substances, intentional self-harm, initial encounter: Secondary | ICD-10-CM | POA: Diagnosis not present

## 2016-08-11 DIAGNOSIS — Z811 Family history of alcohol abuse and dependence: Secondary | ICD-10-CM

## 2016-08-11 DIAGNOSIS — R45851 Suicidal ideations: Secondary | ICD-10-CM | POA: Diagnosis not present

## 2016-08-11 DIAGNOSIS — F419 Anxiety disorder, unspecified: Secondary | ICD-10-CM | POA: Diagnosis not present

## 2016-08-11 DIAGNOSIS — R55 Syncope and collapse: Secondary | ICD-10-CM

## 2016-08-11 DIAGNOSIS — G4700 Insomnia, unspecified: Secondary | ICD-10-CM

## 2016-08-11 MED ORDER — METHOCARBAMOL 500 MG PO TABS
500.0000 mg | ORAL_TABLET | Freq: Three times a day (TID) | ORAL | Status: DC | PRN
  Administered 2016-08-11 (×2): 500 mg via ORAL
  Filled 2016-08-11 (×2): qty 1

## 2016-08-11 MED ORDER — GABAPENTIN 400 MG PO CAPS
800.0000 mg | ORAL_CAPSULE | Freq: Three times a day (TID) | ORAL | Status: DC
  Administered 2016-08-11 – 2016-08-12 (×4): 800 mg via ORAL
  Filled 2016-08-11 (×4): qty 2

## 2016-08-11 MED ORDER — CLONIDINE HCL 0.1 MG PO TABS: 0.1000 mg | ORAL_TABLET | ORAL | Status: DC

## 2016-08-11 MED ORDER — HYDROXYZINE HCL 25 MG PO TABS
25.0000 mg | ORAL_TABLET | Freq: Four times a day (QID) | ORAL | Status: DC | PRN
  Administered 2016-08-11 (×2): 25 mg via ORAL
  Filled 2016-08-11 (×2): qty 1

## 2016-08-11 MED ORDER — DICYCLOMINE HCL 10 MG PO CAPS
20.0000 mg | ORAL_CAPSULE | Freq: Four times a day (QID) | ORAL | Status: DC | PRN
  Administered 2016-08-11: 20 mg via ORAL
  Filled 2016-08-11: qty 2

## 2016-08-11 MED ORDER — OXYCODONE HCL 5 MG PO TABS
30.0000 mg | ORAL_TABLET | Freq: Three times a day (TID) | ORAL | Status: DC | PRN
Start: 2016-08-11 — End: 2016-08-12
  Administered 2016-08-11 – 2016-08-12 (×3): 30 mg via ORAL
  Filled 2016-08-11 (×3): qty 6

## 2016-08-11 MED ORDER — CLONIDINE HCL 0.1 MG PO TABS
0.1000 mg | ORAL_TABLET | Freq: Four times a day (QID) | ORAL | Status: DC
  Administered 2016-08-11: 0.1 mg via ORAL
  Filled 2016-08-11 (×2): qty 1

## 2016-08-11 MED ORDER — CLONIDINE HCL 0.2 MG PO TABS
0.2000 mg | ORAL_TABLET | Freq: Two times a day (BID) | ORAL | Status: DC
  Administered 2016-08-11 – 2016-08-12 (×3): 0.2 mg via ORAL
  Filled 2016-08-11 (×3): qty 1

## 2016-08-11 MED ORDER — ONDANSETRON 4 MG PO TBDP
4.0000 mg | ORAL_TABLET | Freq: Four times a day (QID) | ORAL | Status: DC | PRN
  Administered 2016-08-11: 4 mg via ORAL
  Filled 2016-08-11: qty 1

## 2016-08-11 MED ORDER — LOPERAMIDE HCL 2 MG PO CAPS
2.0000 mg | ORAL_CAPSULE | ORAL | Status: DC | PRN
  Administered 2016-08-11: 4 mg via ORAL
  Filled 2016-08-11: qty 2

## 2016-08-11 MED ORDER — CLONIDINE HCL 0.1 MG PO TABS: 0.1000 mg | ORAL_TABLET | Freq: Every day | ORAL | Status: DC

## 2016-08-11 MED ORDER — ACETAMINOPHEN 325 MG PO TABS
650.0000 mg | ORAL_TABLET | ORAL | Status: DC | PRN
Start: 2016-08-11 — End: 2016-08-12
  Administered 2016-08-11: 650 mg via ORAL
  Filled 2016-08-11: qty 2

## 2016-08-11 NOTE — ED Notes (Signed)
Pt requesting his home dosages of Xanax and Oxycodone. Pt states, "I'm starting to withdraw, I'm sweating, shaking, my stomach hurts, my anxiety is through the roof". EDP notified.

## 2016-08-11 NOTE — Progress Notes (Signed)
CSW was asked to call [facility] Charge Nurse, Steward DroneBrenda, who related that the pt "does not want to go to [facility]."  Apparently last time he was there a staff member irritated him and he is threatening to "Slap them" if he sees them.    CSW related that it is very likely that [facility] has had other pt's who have threatened to act out.  CSW suggested that the patient be IVC'd, particularly given the [facility] and multiple ED visits over the last three days.    Timmothy EulerJean T. Kaylyn LimSutter, MSW, LCSWA Clinical Social Work Disposition [phone]

## 2016-08-11 NOTE — ED Notes (Signed)
Pt becoming increasingly agitated and verbally aggressive towards staff. Primary RN and EDP aware. Primary RN reported going to start pt on COWA protocol per EDP.

## 2016-08-11 NOTE — Progress Notes (Addendum)
Pt accepted to [facility] for [facility] on 08/12/16.  Dr. Estill Cottahomas [place] accepting.  Call report to [phone], Consent forms being faxed to [facility].  Pt to sign and ED to fax back tonight.  Pt may arrive anytime after 11 AM tomorrow.  [facility] Nurse, Bryan Gallegos, notified.   Bryan EulerJean T. Kaylyn LimSutter, MSW, LCSWA Clinical Social Work Disposition [phone]

## 2016-08-11 NOTE — Progress Notes (Signed)
Patient referrals sent out to the following:   [facility], [facility], [facility], [facility], [facility]  Ivana Nicastro T. Kaylyn LimSutter, MSW, LCSWA Clinical Social Work Disposition [phone]
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
## 2016-08-11 NOTE — ED Notes (Signed)
Pt has requested his Oxycodone for his chronic pain. Pt reports when his pain goes up he gets very anxious. EDP notified and order given to restart Oxycodone 30mg  TID PRN. COWS assessment discontinued, but can continue prn COWS medications if needed per EDP.  Pt discussed with nursing staff that he did not "overdose" and did not do it to "kill myself". Pt reports he took too many Clonidine and muscle relaxers (not Clonidine and Xanax as originally reported) so that he could sleep and because he was upset [facility] after he overdosed earlier that day instead of sending him for inpatient treatment. Pt reports he feels as if he needs to have inpatient treatment because when he tries to get outpatient psychiatric help he has to wait 5-6 months before he can be seen and "that's crazy". Pt said [facility] but they wouldn't give him that and sent him home instead. Pt reports that is why he came back to the hospital so hopefully they would give him inpatient treatment this time. EDP notified of discussion with pt.

## 2016-08-11 NOTE — ED Provider Notes (Signed)
TTS has re-evaluated pt:  Given this is pt's 2nd ED visit in 2 days, and makes statements such as "frustrated because he keeps coming to the ED and they keep letting me go;" psych will re-evaluate tomorrow. Pt's notes also state that he "regularly takes more of his meds than is prescribed to him" (oxycone, xanax and adderall). [facility] detox protocol ordered.    Samuel JesterMcManus, Ubah Radke, DO 08/11/16 1216
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
## 2016-08-11 NOTE — ED Notes (Signed)
Sheriff arrived. RN discussed with sheriff about pt's aggression earlier prior to medication. Pt is calm and cooperative at this time. Sheriff had discussion with pt about remaining calm. Sheriff leaving and said he would return if needed.

## 2016-08-11 NOTE — ED Notes (Signed)
[facility] Will seek outside placement & to be evaluated again in the morning.

## 2016-08-11 NOTE — ED Notes (Signed)
Pt reports he is claustrophobic and the small room he is in is making his anxiety worse. Consulting civil engineerCharge RN notified. Pt will be moved to room 18.

## 2016-08-12 DIAGNOSIS — T50992A Poisoning by other drugs, medicaments and biological substances, intentional self-harm, initial encounter: Secondary | ICD-10-CM | POA: Diagnosis not present

## 2016-08-12 MED ORDER — LORAZEPAM 1 MG PO TABS
2.0000 mg | ORAL_TABLET | Freq: Once | ORAL | Status: AC
  Administered 2016-08-12: 2 mg via ORAL
  Filled 2016-08-12: qty 2

## 2016-08-12 NOTE — ED Provider Notes (Signed)
  Physical Exam  BP (!) 153/92 (BP Location: Right Arm)   Pulse 84   Temp 97.8 F (36.6 C) (Oral)   Resp 18   SpO2 98%   Physical Exam  ED Course  Procedures  MDM [facility] Dr. Loyola Mastornwall. Patient is under involuntary commitment. We will transfer.       Benjiman CorePickering, Calisa Luckenbaugh, MD 08/12/16 [phone]
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
## 2016-08-12 NOTE — ED Notes (Signed)
Pt requesting pain medication. Oleg[facility]Kathy, [facility] need [facility] 30 mg IR
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
## 2016-08-12 NOTE — ED Notes (Signed)
Sheriff here to transport 

## 2016-09-07 ENCOUNTER — Emergency Department (HOSPITAL_COMMUNITY): Payer: Medicare Other

## 2016-09-07 ENCOUNTER — Emergency Department (HOSPITAL_COMMUNITY)
Admission: EM | Admit: 2016-09-07 | Discharge: 2016-09-08 | Disposition: A | Discharge status: Home / Self Care | Payer: Medicare Other | Attending: Emergency Medicine | Admitting: Emergency Medicine

## 2016-09-07 ENCOUNTER — Encounter (HOSPITAL_COMMUNITY): Payer: Self-pay

## 2016-09-07 DIAGNOSIS — Z794 Long term (current) use of insulin: Secondary | ICD-10-CM | POA: Diagnosis not present

## 2016-09-07 DIAGNOSIS — J45909 Unspecified asthma, uncomplicated: Secondary | ICD-10-CM | POA: Diagnosis not present

## 2016-09-07 DIAGNOSIS — E119 Type 2 diabetes mellitus without complications: Secondary | ICD-10-CM | POA: Diagnosis not present

## 2016-09-07 DIAGNOSIS — Z79899 Other long term (current) drug therapy: Secondary | ICD-10-CM | POA: Diagnosis not present

## 2016-09-07 DIAGNOSIS — Z7982 Long term (current) use of aspirin: Secondary | ICD-10-CM | POA: Insufficient documentation

## 2016-09-07 DIAGNOSIS — F17210 Nicotine dependence, cigarettes, uncomplicated: Secondary | ICD-10-CM | POA: Insufficient documentation

## 2016-09-07 DIAGNOSIS — F909 Attention-deficit hyperactivity disorder, unspecified type: Secondary | ICD-10-CM | POA: Insufficient documentation

## 2016-09-07 DIAGNOSIS — Z8673 Personal history of transient ischemic attack (TIA), and cerebral infarction without residual deficits: Secondary | ICD-10-CM | POA: Diagnosis not present

## 2016-09-07 DIAGNOSIS — R400 Somnolence: Secondary | ICD-10-CM

## 2016-09-07 DIAGNOSIS — L0291 Cutaneous abscess, unspecified: Secondary | ICD-10-CM

## 2016-09-07 DIAGNOSIS — G4730 Sleep apnea, unspecified: Secondary | ICD-10-CM | POA: Insufficient documentation

## 2016-09-07 DIAGNOSIS — I10 Essential (primary) hypertension: Secondary | ICD-10-CM | POA: Insufficient documentation

## 2016-09-07 DIAGNOSIS — F319 Bipolar disorder, unspecified: Secondary | ICD-10-CM | POA: Diagnosis not present

## 2016-09-07 DIAGNOSIS — L02414 Cutaneous abscess of left upper limb: Secondary | ICD-10-CM | POA: Diagnosis not present

## 2016-09-07 DIAGNOSIS — F419 Anxiety disorder, unspecified: Secondary | ICD-10-CM | POA: Insufficient documentation

## 2016-09-07 DIAGNOSIS — R4182 Altered mental status, unspecified: Secondary | ICD-10-CM | POA: Diagnosis present

## 2016-09-07 LAB — COMPREHENSIVE METABOLIC PANEL
ALBUMIN: 3.5 g/dL (ref 3.5–5.0)
ALK PHOS: 60 U/L (ref 38–126)
ALT: 37 U/L (ref 17–63)
ANION GAP: 9 (ref 5–15)
AST: 35 U/L (ref 15–41)
BUN: 9 mg/dL (ref 6–20)
CALCIUM: 8.7 mg/dL — AB (ref 8.9–10.3)
CHLORIDE: 102 mmol/L (ref 101–111)
CO2: 24 mmol/L (ref 22–32)
Creatinine, Ser: 0.87 mg/dL (ref 0.61–1.24)
GFR calc non Af Amer: >60 mL/min (ref >60)
GLUCOSE: 169 mg/dL — AB (ref 65–99)
POTASSIUM: 4.3 mmol/L (ref 3.5–5.1)
SODIUM: 135 mmol/L (ref 135–145)
Total Bilirubin: 1 mg/dL (ref 0.3–1.2)
Total Protein: 7.6 g/dL (ref 6.5–8.1)

## 2016-09-07 LAB — URINALYSIS, COMPLETE (UACMP) WITH MICROSCOPIC
BILIRUBIN URINE: NEGATIVE
Bacteria, UA: NONE SEEN
Glucose, UA: NEGATIVE mg/dL
HGB URINE DIPSTICK: NEGATIVE
KETONES UR: NEGATIVE mg/dL
LEUKOCYTES UA: NEGATIVE
Nitrite: NEGATIVE
Protein, ur: NEGATIVE mg/dL
SPECIFIC GRAVITY, URINE: 1.029 (ref 1.005–1.030)
SQUAMOUS EPITHELIAL / LPF: NONE SEEN
pH: 5 (ref 5.0–8.0)

## 2016-09-07 LAB — CBC
HEMATOCRIT: 48.8 % (ref 39.0–52.0)
HEMOGLOBIN: 15.6 g/dL (ref 13.0–17.0)
MCH: 29.3 pg (ref 26.0–34.0)
MCHC: 32 g/dL (ref 30.0–36.0)
MCV: 91.6 fL (ref 78.0–100.0)
Platelets: 174 10*3/uL (ref 150–400)
RBC: 5.33 MIL/uL (ref 4.22–5.81)
RDW: 14.6 % (ref 11.5–15.5)
WBC: 10.1 10*3/uL (ref 4.0–10.5)

## 2016-09-07 LAB — CBG MONITORING, ED
GLUCOSE-CAPILLARY: 176 mg/dL — AB (ref 65–99)
Glucose-Capillary: 177 mg/dL — ABNORMAL HIGH (ref 65–99)

## 2016-09-07 LAB — RAPID URINE DRUG SCREEN, HOSP PERFORMED
AMPHETAMINES: POSITIVE — AB
BARBITURATES: NOT DETECTED
BENZODIAZEPINES: POSITIVE — AB
Cocaine: NOT DETECTED
Opiates: POSITIVE — AB
Tetrahydrocannabinol: NOT DETECTED

## 2016-09-07 LAB — I-STAT ARTERIAL BLOOD GAS, ED
ACID-BASE EXCESS: 3 mmol/L — AB (ref 0.0–2.0)
Bicarbonate: 30.3 mmol/L — ABNORMAL HIGH (ref 20.0–28.0)
O2 SAT: 92 %
PCO2 ART: 54.5 mmHg — AB (ref 32.0–48.0)
PH ART: 7.352 (ref 7.350–7.450)
Patient temperature: 98.2
TCO2: 32 mmol/L (ref 0–100)
pO2, Arterial: 67 mmHg — ABNORMAL LOW (ref 83.0–108.0)

## 2016-09-07 LAB — AMMONIA: Ammonia: 53 umol/L — ABNORMAL HIGH (ref 9–35)

## 2016-09-07 LAB — I-STAT CG4 LACTIC ACID, ED: LACTIC ACID, VENOUS: 0.95 mmol/L (ref 0.5–1.9)

## 2016-09-07 MED ORDER — IPRATROPIUM-ALBUTEROL 0.5-2.5 (3) MG/3ML IN SOLN
3.0000 mL | Freq: Once | RESPIRATORY_TRACT | Status: AC
  Administered 2016-09-07: 3 mL via RESPIRATORY_TRACT
  Filled 2016-09-07: qty 3

## 2016-09-07 MED ORDER — NALOXONE HCL 0.4 MG/ML IJ SOLN
0.4000 mg | Freq: Once | INTRAMUSCULAR | Status: AC
  Administered 2016-09-07: 0.4 mg via NASAL
  Filled 2016-09-07: qty 1

## 2016-09-07 MED ORDER — LIDOCAINE-EPINEPHRINE (PF) 2 %-1:200000 IJ SOLN
20.0000 mL | Freq: Once | INTRAMUSCULAR | Status: AC
  Administered 2016-09-07: 20 mL
  Filled 2016-09-07: qty 20

## 2016-09-07 MED ORDER — LIDOCAINE-EPINEPHRINE 2 %-1:100000 IJ SOLN: 20.0000 mL | Freq: Once | INTRAMUSCULAR | Status: DC

## 2016-09-07 NOTE — ED Notes (Signed)
EDP at bedside with ultrasound for IV

## 2016-09-07 NOTE — ED Notes (Signed)
Unable to obtain IV access. IV team notified

## 2016-09-07 NOTE — ED Notes (Signed)
Pt pu ton 2l O2@ per provider

## 2016-09-07 NOTE — ED Provider Notes (Signed)
[facility] DEPT Provider Note   CSN: [id number] Arrival date & time: 09/07/16  1753     History   Chief Complaint Chief Complaint  Patient presents with  . Fatigue  . Abscess  . Altered Mental Status    HPI Bryan Gallegos is a 41 y.o. male with past medical history of diabetes, morbid obesity, multiple drug overdoses, mental illness, benzodiazepine dependence, acute on chronic respiratory failure from obesity hypoventilation presents emergency Department with his mother for altered mental status and a left forearm abscess. There is a level V caveat due to altered mental status. Patient arrives very somnolent. He is arousable when shaking or with loud voice. He denies any opiate use, but did state that he used a Xanax today. He states he is prescribed this medication. Prior to arrival. His respiratory rate was 7 and he was given nasal Narcan, which did increase his arousal. The patient feels he has had subjective fevers. He complains of an abscess on the left forearm, which she tried draining at home with a "clean scalpel."  HPI  Past Medical History:  Diagnosis Date  . Adult ADHD   . Anaphylactic reaction   . Anxiety   . Asthma   . Bipolar disorder ([place])   . Complication of anesthesia    Per patient difficult intubation;  . Diabetes mellitus   . Difficult intubation    Per patient  . Heart valve disorder    s/p echocardiogram  . Hyperlipidemia   . Hypertension   . Morbidly obese ([place])   . OSA (obstructive sleep apnea)   . Transient cerebral ischemia    Unknown    Patient Active Problem List   Diagnosis Date Noted  . Acute and chronic respiratory failure with hypercapnia ([place])   . Pulmonary edema   . Syncope   . Respiratory failure ([place]) 01/04/2016  . Drug overdose   . Acute encephalopathy 01/01/2016  . Hyponatremia 01/01/2016  . Chronic pain syndrome 10/22/2015  . Healthcare maintenance 10/22/2015  . Hoarse voice quality 10/11/2015  . Major depressive  disorder, recurrent severe without psychotic features ([place]) 09/04/2015  . Polysubstance dependence including opioid type drug, episodic abuse ([place]) 09/04/2015  . RUQ abdominal pain   . Cholelithiasis 05/17/2015  . Antisocial personality disorder 04/11/2015  . Opioid use disorder, severe, dependence ([place]) 04/11/2015  . Benzodiazepine dependence ([place]) 04/11/2015  . Tobacco use disorder 04/10/2015  . Narcotic dependency, continuous ([place]) 03/14/2014  . Chronic back pain greater than 3 months duration 01/28/2014  . Gastroesophageal reflux disease with esophagitis 01/28/2014  . Essential hypertension, benign 01/28/2014  . Diabetes mellitus type II, controlled ([place]) 01/28/2014  . Morbid obesity ([place]) 01/28/2014  . TIA (transient ischemic attack) 09/02/2013  . HLD (hyperlipidemia) 08/06/2013  . OSA on CPAP 08/06/2013    Past Surgical History:  Procedure Laterality Date  . CARPAL TUNNEL RELEASE    . ESOPHAGOGASTRODUODENOSCOPY N/A 04/05/2015   Procedure: ESOPHAGOGASTRODUODENOSCOPY (EGD);  Surgeon: Rogene Houston, MD;  Location: [facility];  Service: Endoscopy;  Laterality: N/A;  . ESOPHAGOGASTRODUODENOSCOPY (EGD) WITH PROPOFOL N/A 05/21/2015   Procedure: ESOPHAGOGASTRODUODENOSCOPY (EGD) WITH PROPOFOL;  Surgeon: Ladene Artist, MD;  Location: [facility] ENDOSCOPY;  Service: Endoscopy;  Laterality: N/A;  . NOSE SURGERY    . TOOTH EXTRACTION    . WISDOM TOOTH EXTRACTION         Home Medications    Prior to Admission medications   Medication Sig Start Date End Date Taking? Authorizing Provider  [place]  LANCETS lancets Use as instructed for 3 times daily testing of blood sugar 11/10/15   Quentin Angst, MD  albuterol (PROVENTIL HFA;VENTOLIN HFA) 108 (90 Base) MCG/ACT inhaler Inhale 1-2 puffs into the lungs every 6 (six) hours as needed for wheezing or shortness of breath. 11/20/15   Marlon Pel, PA-C  alprazolam Prudy Feeler) 2 MG tablet Take 1-2 mg by mouth 3 (three) times daily as  needed for sleep or anxiety.     [provider]  amphetamine-dextroamphetamine (ADDERALL) 30 MG tablet Take 60 mg by mouth daily.     [provider]  aspirin 325 MG tablet Take 1 tablet (325 mg total) by mouth daily. For heart health 09/05/15   Armandina Stammer I, NP  atorvastatin (LIPITOR) 20 MG tablet Take 20 mg by mouth at bedtime. 07/21/16   [provider]  blood glucose meter kit and supplies KIT Dispense based on patient and insurance preference. Use up to four times daily as directed. (FOR ICD-9 250.00, 250.01). 10/13/15   Standley Brooking, MD  Blood Glucose Monitoring Suppl (ACCU-CHEK AVIVA PLUS) w/Device KIT Use as directed to check blood sugar 3 times daily. 11/10/15   Quentin Angst, MD  citalopram (CELEXA) 20 MG tablet Take 1 tablet (20 mg total) by mouth every morning. Patient taking differently: Take 20 mg by mouth daily.  10/15/15   Storm Frisk, MD  cloNIDine (CATAPRES) 0.1 MG tablet Take 2 tablets (0.2 mg total) by mouth 2 (two) times daily. 10/22/15   Quentin Angst, MD  divalproex (DEPAKOTE) 500 MG DR tablet TAKE 1 TABLET BY MOUTH 2 TIMES DAILY. 03/03/16   Quentin Angst, MD  DULoxetine (CYMBALTA) 30 MG capsule Take 1 capsule by mouth daily. 07/21/16   [provider]  Fluticasone-Salmeterol (ADVAIR) 250-50 MCG/DOSE AEPB Inhale 1 puff into the lungs 2 (two) times daily.    [provider]  gabapentin (NEURONTIN) 800 MG tablet Take 800 mg by mouth 3 (three) times daily. 12/16/15   [provider]  glucose blood (ACCU-CHEK AVIVA) test strip Use as instructed for 3 times daily testing of blood sugar 11/10/15   Jegede, Olugbemiga E, MD  insulin glargine (LANTUS) 100 UNIT/ML injection Inject 0.2 mLs (20 Units total) into the skin at bedtime. 10/15/15   Storm Frisk, MD  JANUVIA 100 MG tablet Take 100 mg by mouth daily. 07/21/16   [provider]  Lancet Devices [facility]) lancets Use as instructed  for 3 times daily testing of blood sugar 11/10/15   Jegede, Olugbemiga E, MD  metFORMIN (GLUCOPHAGE) 1000 MG tablet Take 1 tablet (1,000 mg total) by mouth 2 (two) times daily. 10/15/15   Storm Frisk, MD  metoprolol tartrate (LOPRESSOR) 50 MG tablet Take 50 mg by mouth 2 (two) times daily. 07/21/16   [provider]  omeprazole (PRILOSEC) 40 MG capsule Take 1 capsule (40 mg total) by mouth 2 (two) times daily. 10/15/15   Storm Frisk, MD  PRESCRIPTION MEDICATION Inhale into the lungs at bedtime. BIPAP (VPAP)    [provider]  ranitidine (ZANTAC) 150 MG tablet Take 1 tablet by mouth 2 (two) times daily. 07/21/16   [provider]  testosterone cypionate (DEPOTESTOSTERONE CYPIONATE) 200 MG/ML injection Inject 2 mLs into the muscle every 30 (thirty) days. 06/25/16   [provider]  tiZANidine (ZANAFLEX) 4 MG tablet Take 1 tablet by mouth 3 (three) times daily. 07/21/16   [provider]  traZODone (DESYREL) 50 MG tablet  Take 1 tablet (50 mg total) by mouth at bedtime. For sleep 10/15/15   Elsie Stain, MD  ursodiol (ACTIGALL) 300 MG capsule Take 300 mg by mouth 2 (two) times daily. 07/21/16   [provider]  valsartan (DIOVAN) 160 MG tablet Take 160 mg by mouth daily. 07/21/16   [provider]    Family History Family History  Problem Relation Age of Onset  . CAD Mother        Living  . Diabetes Mellitus II Mother   . Stroke Mother   . Hypertension Mother   . Congestive Heart Failure Mother   . Kidney disease Mother   . Fibromyalgia Mother   . Thyroid disease Mother   . Hyperlipidemia Mother   . Liver disease Mother   . Alcoholism Father 40       Deceased  . Arthritis Maternal Grandmother   . Congestive Heart Failure Maternal Grandmother   . Hypertension Maternal Grandmother   . Lung cancer Maternal Grandfather   . Colon cancer Maternal Aunt   . Stomach cancer Maternal Aunt   . Heart disease Other         Paternal & Maternal  . Crohn's disease Other   . Hypertension Other        Paternal & Maternal  . Hypertension Brother        x3  . Hypertension Sister        #1  . Bipolar disorder Sister        #1  . ADD / ADHD Son        x3  . Bipolar disorder Son        x3  . Asperger's syndrome Son     Social History Social History  Substance Use Topics  . Smoking status: Current Every Day Smoker    Packs/day: 0.50    Years: 23.00    Types: Cigarettes  . Smokeless tobacco: Never Used  . Alcohol use No     Allergies   Atenolol; Lisinopril; Prednisone; Ibuprofen; and Tylenol [acetaminophen]   Review of Systems Review of Systems  Ten systems reviewed and are negative for acute change, except as noted in the HPI.   Physical Exam Updated Vital Signs BP 132/87 (BP Location: Right Arm)   Pulse 99   Temp 98.2 F (36.8 C)   Resp 10   Ht '5\' 11"'$  (1.803 m)   Wt (!) 161 kg (355 lb)   SpO2 95%   BMI 49.51 kg/m   Physical Exam  Constitutional: He appears well-developed and well-nourished. No distress.  HENT:  Head: Normocephalic and atraumatic.  Eyes: Conjunctivae are normal. No scleral icterus.  Neck: Normal range of motion. Neck supple.  Cardiovascular: Normal rate, regular rhythm and normal heart sounds.   Pulmonary/Chest: Effort normal and breath sounds normal. No respiratory distress.  Abdominal: Soft. There is no tenderness.  Musculoskeletal: He exhibits no edema.  Neurological: He is alert.  Skin: Skin is warm and dry. He is not diaphoretic.  Psychiatric: His behavior is normal.  Nursing note and vitals reviewed.    ED Treatments / Results  Labs (all labs ordered are listed, but only abnormal results are displayed) Labs Reviewed  COMPREHENSIVE METABOLIC PANEL - Abnormal; Notable for the following:       Result Value   Glucose, Bld 169 (*)    Calcium 8.7 (*)    All other components within normal limits  CBG MONITORING, ED - Abnormal; Notable for the following:  Glucose-Capillary 177 (*)    All other components within normal limits  CBG MONITORING, ED - Abnormal; Notable for the following:    Glucose-Capillary 176 (*)    All other components within normal limits  I-STAT ARTERIAL BLOOD GAS, ED - Abnormal; Notable for the following:    pCO2 arterial 54.5 (*)    pO2, Arterial 67.0 (*)    Bicarbonate 30.3 (*)    Acid-Base Excess 3.0 (*)    All other components within normal limits  CBC  URINALYSIS, COMPLETE (UACMP) WITH MICROSCOPIC  AMMONIA  RAPID URINE DRUG SCREEN, HOSP PERFORMED  CBG MONITORING, ED  I-STAT CG4 LACTIC ACID, ED    EKG  EKG Interpretation None       Radiology No results found.  Procedures Procedures (including critical care time) INCISION AND DRAINAGE Performed by: Margarita Mail Consent: Verbal consent obtained. Risks and benefits: risks, benefits and alternatives were discussed Type: abscess Body area: Left wrist  Anesthesia: local infiltration  Incision was made with a scalpel.  Local anesthetic: lidocaine 2% w epinephrine  Anesthetic total: 4 ml  Complexity: complex Blunt dissection to break up loculations  Drainage: purulent  Drainage amount: moderate    Patient tolerance: Patient tolerated the procedure well with no immediate complications.    Medications Ordered in ED Medications  naloxone [facility]) injection 0.4 mg (0.4 mg Nasal Given 09/07/16 1902)  lidocaine-EPINEPHrine (XYLOCAINE W/EPI) 2 %-1:200000 (PF) injection 20 mL (20 mLs Infiltration Given 09/07/16 2113)  ipratropium-albuterol (DUONEB) 0.5-2.5 (3) MG/3ML nebulizer solution 3 mL (3 mLs Nebulization Given 09/07/16 2146)     Initial Impression / Assessment and Plan / ED Course  I have reviewed the triage vital signs and the nursing notes.  Pertinent labs & imaging results that were available during my care of the patient were reviewed by me and considered in my medical decision making (see chart for details).  Clinical Course  as of Sep 08 2139  Tue Sep 07, 2016  2041 pCO2 arterial: (!) 54.5 [AH]  2041 pO2, Arterial: (!) 67.0 [AH]  2042 Glucose: (!) 169 [AH]    Clinical Course User Index [AH] Margarita Mail, PA-C    . Patient with obesity hypoventilation, chronic hypercarbia as noted in previous visits, multiple mind altering substance use. Patient had some minor hypoxia. His arterial blood gases well compensated. The patient became more arousable. When sat up. This is likely a multifactorial somnolence. Given his use of benzodiazepines, morbid obesity, and obesity hypoventilation and chronic hypercarbia. This is likely not the best medication for him. He is advised to follow closely with his PCP. He is also advised to follow closely with pulmonology regarding his CPAP which he is noncompliant. Given the fact that he has obstructive sleep apnea. This may also be contributing to his daytime somnolence. He does not appear to need inpatient admission at this time. His left abscess has been drained successfully and home care has been given. Patient seen in shared visit with attending physician. Who agrees with assessment, work up , treatment, and plan for discharge.    Final Clinical Impressions(s) / ED Diagnoses   Final diagnoses:  Abscess  Somnolence  Sleep apnea, unspecified type    New Prescriptions New Prescriptions   No medications on file     Margarita Mail, PA-C 09/11/16 0046    Gareth Morgan, MD 09/12/16 2059

## 2016-09-07 NOTE — ED Notes (Signed)
IV team at bedside 

## 2016-09-07 NOTE — Discharge Instructions (Signed)
Get help right away if: °You have severe pain or bleeding. °You cannot eat or drink without vomiting. °You have decreased urine output. °You become short of breath. °You have chest pain. °You cough up blood. °The area where the incision and drainage occurred becomes numb or it tingles. °

## 2016-09-07 NOTE — ED Notes (Signed)
Pt asleep. Wears cpap at home per family

## 2016-09-07 NOTE — ED Triage Notes (Addendum)
Pt presents to the ed with complaints of "passing out a lot for the last two days" the patient has an abscess on his left arm. Vs wnl. States that he has just been passing out all day. Patient continues to fall asleep in wheelchair during triage. Alert and oriented. Alert to voice, complains of pain where his abscess is.

## 2016-09-07 NOTE — ED Notes (Signed)
Ambulated Pt in hallway. Pt needed no help getting up and no help walking. Pt stated he was not short of breath and felt well. Pt hooked back up to Pulse Ox and BP. Pulse Ox staying between 94%-97%. Pt sitting up on the side of the bed.

## 2016-09-07 NOTE — ED Notes (Signed)
cbg was 177. 

## 2016-09-07 NOTE — ED Notes (Signed)
Pt kept in triage on [facility] and pulse ox waiting for room

## 2016-09-08 MED ORDER — DOXYCYCLINE HYCLATE 100 MG PO CAPS: 100.0000 mg | ORAL_CAPSULE | Freq: Two times a day (BID) | ORAL | 0 refills | Status: DC

## 2016-09-11 ENCOUNTER — Other Ambulatory Visit: Payer: Self-pay | Admitting: Internal Medicine

## 2016-09-11 ENCOUNTER — Encounter (HOSPITAL_COMMUNITY): Payer: Self-pay | Admitting: Emergency Medicine

## 2016-09-11 ENCOUNTER — Emergency Department (HOSPITAL_COMMUNITY)
Admission: EM | Admit: 2016-09-11 | Discharge: 2016-09-11 | Disposition: A | Discharge status: Home / Self Care | Payer: Medicare Other | Attending: Emergency Medicine | Admitting: Emergency Medicine

## 2016-09-11 DIAGNOSIS — Y999 Unspecified external cause status: Secondary | ICD-10-CM | POA: Diagnosis not present

## 2016-09-11 DIAGNOSIS — F419 Anxiety disorder, unspecified: Secondary | ICD-10-CM | POA: Diagnosis not present

## 2016-09-11 DIAGNOSIS — Y9389 Activity, other specified: Secondary | ICD-10-CM | POA: Insufficient documentation

## 2016-09-11 DIAGNOSIS — Z79899 Other long term (current) drug therapy: Secondary | ICD-10-CM | POA: Insufficient documentation

## 2016-09-11 DIAGNOSIS — J45909 Unspecified asthma, uncomplicated: Secondary | ICD-10-CM | POA: Diagnosis not present

## 2016-09-11 DIAGNOSIS — E119 Type 2 diabetes mellitus without complications: Secondary | ICD-10-CM | POA: Diagnosis not present

## 2016-09-11 DIAGNOSIS — F332 Major depressive disorder, recurrent severe without psychotic features: Secondary | ICD-10-CM | POA: Diagnosis not present

## 2016-09-11 DIAGNOSIS — Z794 Long term (current) use of insulin: Secondary | ICD-10-CM | POA: Diagnosis not present

## 2016-09-11 DIAGNOSIS — IMO0002 Reserved for concepts with insufficient information to code with codable children: Secondary | ICD-10-CM

## 2016-09-11 DIAGNOSIS — S61310A Laceration without foreign body of right index finger with damage to nail, initial encounter: Secondary | ICD-10-CM

## 2016-09-11 DIAGNOSIS — W25XXXA Contact with sharp glass, initial encounter: Secondary | ICD-10-CM | POA: Insufficient documentation

## 2016-09-11 DIAGNOSIS — F17210 Nicotine dependence, cigarettes, uncomplicated: Secondary | ICD-10-CM | POA: Diagnosis not present

## 2016-09-11 DIAGNOSIS — Z7984 Long term (current) use of oral hypoglycemic drugs: Secondary | ICD-10-CM | POA: Insufficient documentation

## 2016-09-11 DIAGNOSIS — I10 Essential (primary) hypertension: Secondary | ICD-10-CM | POA: Insufficient documentation

## 2016-09-11 DIAGNOSIS — Y929 Unspecified place or not applicable: Secondary | ICD-10-CM | POA: Insufficient documentation

## 2016-09-11 HISTORY — DX: Other psychoactive substance abuse, uncomplicated: F19.10

## 2016-09-11 HISTORY — DX: Poisoning by unspecified drugs, medicaments and biological substances, accidental (unintentional), initial encounter: T50.901A

## 2016-09-11 MED ORDER — CLINDAMYCIN HCL 150 MG PO CAPS: ORAL_CAPSULE | ORAL | 0 refills | Status: DC

## 2016-09-11 MED ORDER — LIDOCAINE HCL (PF) 1 % IJ SOLN
INTRAMUSCULAR | Status: AC
  Filled 2016-09-11: qty 5

## 2016-09-11 MED ORDER — BACITRACIN-NEOMYCIN-POLYMYXIN 400-5-5000 EX OINT
TOPICAL_OINTMENT | Freq: Once | CUTANEOUS | Status: AC
  Administered 2016-09-11: 23:00:00 via TOPICAL
  Filled 2016-09-11: qty 1

## 2016-09-11 MED ORDER — BACITRACIN ZINC 500 UNIT/GM EX OINT
TOPICAL_OINTMENT | CUTANEOUS | Status: AC
  Filled 2016-09-11: qty 0.9

## 2016-09-11 NOTE — ED Notes (Signed)
Pt is emotionally unstable at this visit. States his wife died 4 days ago due to the care provided at this visit. Says he is having severe anxiety and wants to get something for it. States, "Everybody in this hospital that is apart of my family has died. I need something for anxiety, if not, I'm going to end up doing something stupid."

## 2016-09-11 NOTE — ED Triage Notes (Signed)
Pt states he cut his finger when he broke a glass

## 2016-09-11 NOTE — ED Provider Notes (Signed)
[facility] DEPT Provider Note   CSN: [id number] Arrival date & time: 09/11/16  1955     History   Chief Complaint Chief Complaint  Patient presents with  . Laceration    HPI Bryan Gallegos is a 41 y.o. male.  HPI  Pt was seen at 2055. Per pt, c/o sudden onset and resolution of one episode of finger laceration that occurred PTA. Pt states a glass broke when he was washing it. Denies any other injuries. Denies focal motor weakness, no tingling/numbness in extremity.  Pt also c/o increasing anxiety over the recent deaths of several family members, including his wife 4 days ago, and states he "might end up doing something stupid" if he did not get medication for his anxiety. Denies SI, no SA, no HI, no hallucinations. The symptoms have been associated with no other complaints. The patient has a significant history of similar symptoms previously, recently being evaluated for this complaint and multiple prior evals for same.    Td 2016 per records Past Medical History:  Diagnosis Date  . Adult ADHD   . Anaphylactic reaction   . Anxiety   . Asthma   . Bipolar disorder ([place])   . Complication of anesthesia    Per patient difficult intubation;  . Diabetes mellitus   . Difficult intubation    Per patient  . Drug overdose   . Heart valve disorder    s/p echocardiogram  . Hyperlipidemia   . Hypertension   . Morbidly obese ([place])   . OSA (obstructive sleep apnea)   . Polysubstance abuse   . Transient cerebral ischemia    Unknown    Patient Active Problem List   Diagnosis Date Noted  . Acute and chronic respiratory failure with hypercapnia ([place])   . Pulmonary edema   . Syncope   . Respiratory failure ([place]) 01/04/2016  . Drug overdose   . Acute encephalopathy 01/01/2016  . Hyponatremia 01/01/2016  . Chronic pain syndrome 10/22/2015  . Healthcare maintenance 10/22/2015  . Hoarse voice quality 10/11/2015  . Major depressive disorder, recurrent severe without psychotic  features ([place]) 09/04/2015  . Polysubstance dependence including opioid type drug, episodic abuse ([place]) 09/04/2015  . RUQ abdominal pain   . Cholelithiasis 05/17/2015  . Antisocial personality disorder 04/11/2015  . Opioid use disorder, severe, dependence ([place]) 04/11/2015  . Benzodiazepine dependence ([place]) 04/11/2015  . Tobacco use disorder 04/10/2015  . Narcotic dependency, continuous ([place]) 03/14/2014  . Chronic back pain greater than 3 months duration 01/28/2014  . Gastroesophageal reflux disease with esophagitis 01/28/2014  . Essential hypertension, benign 01/28/2014  . Diabetes mellitus type II, controlled ([place]) 01/28/2014  . Morbid obesity ([place]) 01/28/2014  . TIA (transient ischemic attack) 09/02/2013  . HLD (hyperlipidemia) 08/06/2013  . OSA on CPAP 08/06/2013    Past Surgical History:  Procedure Laterality Date  . CARPAL TUNNEL RELEASE    . ESOPHAGOGASTRODUODENOSCOPY N/A 04/05/2015   Procedure: ESOPHAGOGASTRODUODENOSCOPY (EGD);  Surgeon: Rogene Houston, MD;  Location: [facility];  Service: Endoscopy;  Laterality: N/A;  . ESOPHAGOGASTRODUODENOSCOPY (EGD) WITH PROPOFOL N/A 05/21/2015   Procedure: ESOPHAGOGASTRODUODENOSCOPY (EGD) WITH PROPOFOL;  Surgeon: Ladene Artist, MD;  Location: [facility] ENDOSCOPY;  Service: Endoscopy;  Laterality: N/A;  . NOSE SURGERY    . TOOTH EXTRACTION    . WISDOM TOOTH EXTRACTION         Home Medications    Prior to Admission medications   Medication Sig Start Date End Date Taking? Authorizing Provider  [place]  LANCETS lancets Use as instructed for 3 times daily testing of blood sugar 11/10/15  Yes Jegede, Olugbemiga E, MD  acetaminophen (TYLENOL) 325 MG tablet Take 650 mg by mouth every 6 (six) hours as needed for mild pain or headache.   Yes [provider]  alprazolam Duanne Moron) 2 MG tablet Take 1-2 mg by mouth 3 (three) times daily as needed for sleep or anxiety.    Yes [provider]  amphetamine-dextroamphetamine  (ADDERALL) 30 MG tablet Take 30-60 mg by mouth 2 (two) times daily. Take 17m in the morning and 316min the evening   Yes [provider]  aspirin 325 MG tablet Take 1 tablet (325 mg total) by mouth daily. For heart health 09/05/15  Yes NwLindell Spar, NP  atorvastatin (LIPITOR) 20 MG tablet Take 20 mg by mouth at bedtime. 07/21/16  Yes [provider]  blood glucose meter kit and supplies KIT Dispense based on patient and insurance preference. Use up to four times daily as directed. (FOR ICD-9 250.00, 250.01). 10/13/15  Yes GoSamuella CotaMD  Blood Glucose Monitoring Suppl (ACCU-CHEK AVIVA PLUS) w/Device KIT Use as directed to check blood sugar 3 times daily. 11/10/15  Yes JeTresa GarterMD  citalopram (CELEXA) 20 MG tablet Take 1 tablet (20 mg total) by mouth every morning. Patient taking differently: Take 20 mg by mouth daily.  10/15/15  Yes WrElsie StainMD  cloNIDine (CATAPRES) 0.1 MG tablet Take 2 tablets (0.2 mg total) by mouth 2 (two) times daily. Patient taking differently: Take 0.2 mg by mouth daily.  10/22/15  Yes Jegede, Olugbemiga E, MD  COMBIVENT RESPIMAT 20-100 MCG/ACT AERS respimat Take 1 puff by mouth daily. 06/24/16  Yes [provider]  divalproex (DEPAKOTE) 500 MG DR tablet TAKE 1 TABLET BY MOUTH 2 TIMES DAILY. Patient taking differently: TAKE 1 TABLET BY MOUTH daily 03/03/16  Yes Jegede, Olugbemiga E, MD  DULoxetine (CYMBALTA) 30 MG capsule Take 1 capsule by mouth daily. 07/21/16  Yes [provider]  Fluticasone-Salmeterol (ADVAIR) 250-50 MCG/DOSE AEPB Inhale 1 puff into the lungs 2 (two) times daily.   Yes [provider]  furosemide (LASIX) 40 MG tablet Take 40 mg by mouth daily. 06/24/16  Yes [provider]  gabapentin (NEURONTIN) 800 MG tablet Take 800 mg by mouth 3 (three) times daily. 12/16/15  Yes [provider]  glucose blood (ACCU-CHEK AVIVA) test strip Use as instructed for 3 times daily testing of  blood sugar 11/10/15  Yes Jegede, Olugbemiga E, MD  insulin glargine (LANTUS) 100 UNIT/ML injection Inject 0.2 mLs (20 Units total) into the skin at bedtime. Patient taking differently: Inject 20 Units into the skin at bedtime as needed.  10/15/15  Yes WrElsie StainMD  JANUVIA 100 MG tablet Take 100 mg by mouth daily. 07/21/16  Yes [provider]  Lancet Devices ([facility]ancets Use as instructed for 3 times daily testing of blood sugar 11/10/15  Yes Jegede, Olugbemiga E, MD  metFORMIN (GLUCOPHAGE) 1000 MG tablet Take 1 tablet (1,000 mg total) by mouth 2 (two) times daily. Patient taking differently: Take 1,000 mg by mouth daily.  10/15/15  Yes WrElsie StainMD  metoprolol tartrate (LOPRESSOR) 50 MG tablet Take 50 mg by mouth 2 (two) times daily. 07/21/16  Yes [provider]  omeprazole (PRILOSEC) 40 MG capsule Take 1 capsule (40 mg total) by mouth 2 (two) times daily. 10/15/15  Yes WrElsie StainMD  ranitidine (ZKeturah Shavers  150 MG tablet Take 1 tablet by mouth 2 (two) times daily. 07/21/16  Yes [provider]  testosterone cypionate (DEPOTESTOSTERONE CYPIONATE) 200 MG/ML injection Inject 2 mLs into the muscle every 30 (thirty) days. 06/25/16  Yes [provider]  tiZANidine (ZANAFLEX) 4 MG tablet Take 1 tablet by mouth 3 (three) times daily. 07/21/16  Yes [provider]  albuterol (PROVENTIL HFA;VENTOLIN HFA) 108 (90 Base) MCG/ACT inhaler Inhale 1-2 puffs into the lungs every 6 (six) hours as needed for wheezing or shortness of breath. 11/20/15   Delos Haring, PA-C  traZODone (DESYREL) 50 MG tablet Take 1 tablet (50 mg total) by mouth at bedtime. For sleep Patient not taking: Reported on 09/07/2016 10/15/15   Elsie Stain, MD  valsartan (DIOVAN) 160 MG tablet Take 160 mg by mouth daily. 07/21/16   [provider]    Family History Family History  Problem Relation Age of Onset  . CAD Mother        Living  . Diabetes  Mellitus II Mother   . Stroke Mother   . Hypertension Mother   . Congestive Heart Failure Mother   . Kidney disease Mother   . Fibromyalgia Mother   . Thyroid disease Mother   . Hyperlipidemia Mother   . Liver disease Mother   . Alcoholism Father 26       Deceased  . Arthritis Maternal Grandmother   . Congestive Heart Failure Maternal Grandmother   . Hypertension Maternal Grandmother   . Lung cancer Maternal Grandfather   . Colon cancer Maternal Aunt   . Stomach cancer Maternal Aunt   . Heart disease Other        Paternal & Maternal  . Crohn's disease Other   . Hypertension Other        Paternal & Maternal  . Hypertension Brother        x3  . Hypertension Sister        #1  . Bipolar disorder Sister        #1  . ADD / ADHD Son        x3  . Bipolar disorder Son        x3  . Asperger's syndrome Son     Social History Social History  Substance Use Topics  . Smoking status: Current Every Day Smoker    Packs/day: 0.50    Years: 23.00    Types: Cigarettes  . Smokeless tobacco: Never Used  . Alcohol use No     Allergies   Atenolol; Lisinopril; Prednisone; Ibuprofen; and Tylenol [acetaminophen]   Review of Systems Review of Systems ROS: Statement: All systems negative except as marked or noted in the HPI; Constitutional: Negative for fever and chills. ; ; Eyes: Negative for eye pain, redness and discharge. ; ; ENMT: Negative for ear pain, hoarseness, nasal congestion, sinus pressure and sore throat. ; ; Cardiovascular: Negative for chest pain, palpitations, diaphoresis, dyspnea and peripheral edema. ; ; Respiratory: Negative for cough, wheezing and stridor. ; ; Gastrointestinal: Negative for nausea, vomiting, diarrhea, abdominal pain, blood in stool, hematemesis, jaundice and rectal bleeding. . ; ; Genitourinary: Negative for dysuria, flank pain and hematuria. ; ; Musculoskeletal: Negative for back pain and neck pain. Negative for swelling and trauma.; ; Skin: +finger  laceration. Negative for pruritus, rash, abrasions, blisters, bruising and skin lesion.; ; Neuro: Negative for headache, lightheadedness and neck stiffness. Negative for weakness, altered level of consciousness, altered mental status, extremity weakness, paresthesias, involuntary movement, seizure and  syncope.; Psych:  +anxiety. No SI, no SA, no HI, no hallucinations.      Physical Exam Updated Vital Signs BP (!) 146/91 (BP Location: Left Arm)   Pulse (!) 116   Temp 98.4 F (36.9 C) (Oral)   Resp 18   Ht 6' 2" (1.88 m)   Wt (!) 171 kg (377 lb)   SpO2 99%   BMI 48.40 kg/m   Physical Exam 2100: Physical examination:  Nursing notes reviewed; Vital signs and O2 SAT reviewed;  Constitutional: Well developed, Well nourished, Well hydrated, In no acute distress; Head:  Normocephalic, atraumatic; Eyes: EOMI, PERRL, No scleral icterus; ENMT: Mouth and pharynx normal, Mucous membranes moist; Neck: Supple, Full range of motion, No lymphadenopathy; Cardiovascular: Regular rate and rhythm, No gallop; Respiratory: Breath sounds clear & equal bilaterally, No wheezes.  Speaking full sentences with ease, Normal respiratory effort/excursion; Chest: Nontender, Movement normal; Abdomen: Soft, Nontender, Nondistended, Normal bowel sounds;; Extremities: Pulses normal, No deformity, No edema, +laceration right index finger, hemostatic, wrapped in DSD..; Neuro: AA&Ox3, Major CN grossly intact.  Speech clear. No gross focal motor or sensory deficits in extremities.; Skin: Color normal, Warm, Dry.; Psych:  Anxious, rapid/pressured speech. Denies SI/HI, no overt psychosis.     ED Treatments / Results  Labs (all labs ordered are listed, but only abnormal results are displayed)   EKG  EKG Interpretation None       Radiology   Procedures Procedures (including critical care time)  Medications Ordered in ED Medications  lidocaine (PF) (XYLOCAINE) 1 % injection (not administered)    neomycin-bacitracin-polymyxin (NEOSPORIN) ointment (not administered)     Initial Impression / Assessment and Plan / ED Course  I have reviewed the triage vital signs and the nursing notes.  Pertinent labs & imaging results that were available during my care of the patient were reviewed by me and considered in my medical decision making (see chart for details).  MDM Reviewed: nursing note, previous chart and vitals Reviewed previous: labs   Results for orders placed or performed during the hospital encounter of 09/07/16  Comprehensive metabolic panel  Result Value Ref Range   Sodium 135 135 - 145 mmol/L   Potassium 4.3 3.5 - 5.1 mmol/L   Chloride 102 101 - 111 mmol/L   CO2 24 22 - 32 mmol/L   Glucose, Bld 169 (H) 65 - 99 mg/dL   BUN 9 6 - 20 mg/dL   Creatinine, Ser 0.87 0.61 - 1.24 mg/dL   Calcium 8.7 (L) 8.9 - 10.3 mg/dL   Total Protein 7.6 6.5 - 8.1 g/dL   Albumin 3.5 3.5 - 5.0 g/dL   AST 35 15 - 41 U/L   ALT 37 17 - 63 U/L   Alkaline Phosphatase 60 38 - 126 U/L   Total Bilirubin 1.0 0.3 - 1.2 mg/dL   GFR calc non Af Amer >60 >60 mL/min   GFR calc Af Amer >60 >60 mL/min   Anion gap 9 5 - 15  CBC  Result Value Ref Range   WBC 10.1 4.0 - 10.5 K/uL   RBC 5.33 4.22 - 5.81 MIL/uL   Hemoglobin 15.6 13.0 - 17.0 g/dL   HCT 48.8 39.0 - 52.0 %   MCV 91.6 78.0 - 100.0 fL   MCH 29.3 26.0 - 34.0 pg   MCHC 32.0 30.0 - 36.0 g/dL   RDW 14.6 11.5 - 15.5 %   Platelets 174 150 - 400 K/uL  Urinalysis, Complete w Microscopic  Result Value Ref Range  Color, Urine YELLOW YELLOW   APPearance CLEAR CLEAR   Specific Gravity, Urine 1.029 1.005 - 1.030   pH 5.0 5.0 - 8.0   Glucose, UA NEGATIVE NEGATIVE mg/dL   Hgb urine dipstick NEGATIVE NEGATIVE   Bilirubin Urine NEGATIVE NEGATIVE   Ketones, ur NEGATIVE NEGATIVE mg/dL   Protein, ur NEGATIVE NEGATIVE mg/dL   Nitrite NEGATIVE NEGATIVE   Leukocytes, UA NEGATIVE NEGATIVE   RBC / HPF 0-5 0 - 5 RBC/hpf   WBC, UA 0-5 0 - 5 WBC/hpf    Bacteria, UA NONE SEEN NONE SEEN   Squamous Epithelial / LPF NONE SEEN NONE SEEN   Mucous PRESENT    Hyaline Casts, UA PRESENT   Ammonia  Result Value Ref Range   Ammonia 53 (H) 9 - 35 umol/L  Urine rapid drug screen (hosp performed)  Result Value Ref Range   Opiates POSITIVE (A) NONE DETECTED   Cocaine NONE DETECTED NONE DETECTED   Benzodiazepines POSITIVE (A) NONE DETECTED   Amphetamines POSITIVE (A) NONE DETECTED   Tetrahydrocannabinol NONE DETECTED NONE DETECTED   Barbiturates NONE DETECTED NONE DETECTED  CBG monitoring, ED  Result Value Ref Range   Glucose-Capillary 177 (H) 65 - 99 mg/dL  CBG monitoring, ED  Result Value Ref Range   Glucose-Capillary 176 (H) 65 - 99 mg/dL  I-Stat CG4 Lactic Acid, ED  Result Value Ref Range   Lactic Acid, Venous 0.95 0.5 - 1.9 mmol/L  I-Stat arterial blood gas, ED  Result Value Ref Range   pH, Arterial 7.352 7.350 - 7.450   pCO2 arterial 54.5 (H) 32.0 - 48.0 mmHg   pO2, Arterial 67.0 (L) 83.0 - 108.0 mmHg   Bicarbonate 30.3 (H) 20.0 - 28.0 mmol/L   TCO2 32 0 - 100 mmol/L   O2 Saturation 92.0 %   Acid-Base Excess 3.0 (H) 0.0 - 2.0 mmol/L   Patient temperature 98.2 F    Collection site RADIAL, ALLEN'S TEST ACCEPTABLE    Drawn by RT    Sample type ARTERIAL     2215:  Lac repaired by APP; will need Hand Surgeon f/u for partial tendon laceration. Pt has been to the ED multiple times for polysubstance abuse, drug overdose, SI.  Pt was in ED 4 days ago for AMS; labs as above. Pt again in ED and c/o "anxiety" and "if I get too anxious I'll pass out because my carbon dioxide level will build up." States he has "flat lined several times" last year during an admission "because my carbon dioxide level was too high." EPIC chart reviewed: pt had drug OD + hypercarbia (likely d/t non-compliance with CPAP, OSA, obesity hypoventilation syndrome) and was intubated. No notes found regarding "flat lining." Pt is questionable historian. Will have TTS consult.     2300:  TTS has evaluated pt: states no inpt criteria at this time and pt can be d/c with outpt resources. APP spoke with Hand Surgeon Dr. Lenon Curt, who will f/u in office. Dx and testing d/w pt.  Questions answered.  Verb understanding, agreeable to d/c home with outpt f/u.    Final Clinical Impressions(s) / ED Diagnoses   Final diagnoses:  Laceration of right index finger without foreign body with damage to nail, initial encounter  Anxiety    New Prescriptions New Prescriptions   No medications on file     Francine Graven, DO 09/15/16 [id number]

## 2016-09-11 NOTE — ED Notes (Signed)
Tele psych being performed at present time,  
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
## 2016-09-11 NOTE — Discharge Instructions (Signed)
Take the prescription as directed.  Wash the area with soap and water at least twice a day, and cover with a clean/dry dressing.  Change the dressing whenever it becomes wet or soiled after washing the area with soap and water.  Wear the splint until you are seen in follow up by the Hand Surgeon. Call the Hand Surgeon on Monday to schedule a follow up appointment for a recheck within the next 2 days regarding the injury to your tendon in your finger.  Return to the Emergency Department immediately if worsening.

## 2016-09-11 NOTE — ED Notes (Signed)
TTS in process 

## 2016-09-11 NOTE — ED Notes (Signed)
RN went into room to talk with pt, pt states that when he made the statement " I am going to end up doing something stupid" that he was referring to being afraid that if he became anxious he would pass out due to his carbon dioxide building up. Pt denies any SI or HI, denies any hallucinations, does admit to having problems with anxiety due to recent passing of his wife and another family member,

## 2016-09-11 NOTE — Progress Notes (Signed)
Discussed patient with TTS and reviewed chart. Patient examined via tele cart. Alert and oriented x 4, pleasant, cooperative. Denies suicidal ideations, homicidal ideations, and audiovisual hallucinations. No indication that he is responding to internal stimuli. Recommend discharge. Discussed disposition with Dr. Clarene DukeMcManus.

## 2016-09-11 NOTE — ED Notes (Signed)
Pt states he is concerned because he has had increased anxiety in the past and problems with cO2 retention and had to be intubated. Pt denies HI/SI at this time. Has had increase amount of stress. States a jar broke while he was washing dishes and caused the cut to his finger. Small amount of bleeding noted at this time, clean non-stick dressing applied.

## 2016-09-11 NOTE — ED Provider Notes (Signed)
THIS IS A SHARED VISIT WITH DR. Merlene LaughterK. McManus.  PROCEDURE:  LACERATION REPAIR Performed by: NEESE,Bryan Authorized by: NEESE,Bryan Consent: Verbal consent obtained. Risks and benefits: risks, benefits and alternatives were discussed Consent given by: patient  Patient identity confirmed: provided demographic data Exam of finger shows full flexion and extension, good strength.   Prepped and Draped in normal sterile fashion  Wound explored  Laceration Location: right index finger  Cleaned with Betadine and irrigated with saline  Laceration Length: 5 cm  No Foreign Bodies seen or palpated  Extensor tendon visualized and a tiny defect noted  Anesthesia: local infiltration  Local anesthetic: lidocaine 1% without epinephrine  Anesthetic total: 4 ml  Irrigation method: syringe Amount of cleaning: standard  Skin closure: 5-9 prolene  Number of sutures: 5  Technique: interrupted  Dressing, splint and f/u with orthopedic surgeon.   Patient tolerance: Patient tolerated the procedure well with no immediate complications.    Bryan Gallegos, Bryan Gallegos, [place]NP 09/11/16 2152    Bryan Gallegos, Kathleen, DO 09/15/16 [phone]

## 2016-10-16 ENCOUNTER — Emergency Department (HOSPITAL_COMMUNITY)
Admission: EM | Admit: 2016-10-16 | Discharge: 2016-10-16 | Disposition: A | Discharge status: Home / Self Care | Payer: Medicare Other | Source: Home / Self Care | Attending: Emergency Medicine | Admitting: Emergency Medicine

## 2016-10-16 DIAGNOSIS — S51812A Laceration without foreign body of left forearm, initial encounter: Secondary | ICD-10-CM | POA: Diagnosis not present

## 2016-10-16 DIAGNOSIS — F17210 Nicotine dependence, cigarettes, uncomplicated: Secondary | ICD-10-CM | POA: Diagnosis not present

## 2016-10-16 DIAGNOSIS — Z79899 Other long term (current) drug therapy: Secondary | ICD-10-CM | POA: Diagnosis not present

## 2016-10-16 DIAGNOSIS — J811 Chronic pulmonary edema: Secondary | ICD-10-CM | POA: Diagnosis not present

## 2016-10-16 DIAGNOSIS — Z7289 Other problems related to lifestyle: Secondary | ICD-10-CM

## 2016-10-16 DIAGNOSIS — S31119A Laceration without foreign body of abdominal wall, unspecified quadrant without penetration into peritoneal cavity, initial encounter: Secondary | ICD-10-CM | POA: Diagnosis not present

## 2016-10-16 DIAGNOSIS — Z7982 Long term (current) use of aspirin: Secondary | ICD-10-CM | POA: Diagnosis not present

## 2016-10-16 DIAGNOSIS — T465X2A Poisoning by other antihypertensive drugs, intentional self-harm, initial encounter: Secondary | ICD-10-CM | POA: Diagnosis not present

## 2016-10-16 DIAGNOSIS — Y939 Activity, unspecified: Secondary | ICD-10-CM | POA: Diagnosis not present

## 2016-10-16 DIAGNOSIS — T148XXA Other injury of unspecified body region, initial encounter: Secondary | ICD-10-CM

## 2016-10-16 DIAGNOSIS — J9622 Acute and chronic respiratory failure with hypercapnia: Secondary | ICD-10-CM | POA: Diagnosis not present

## 2016-10-16 DIAGNOSIS — T450X2A Poisoning by antiallergic and antiemetic drugs, intentional self-harm, initial encounter: Secondary | ICD-10-CM | POA: Diagnosis present

## 2016-10-16 DIAGNOSIS — Y929 Unspecified place or not applicable: Secondary | ICD-10-CM | POA: Diagnosis not present

## 2016-10-16 DIAGNOSIS — T1491XA Suicide attempt, initial encounter: Secondary | ICD-10-CM | POA: Diagnosis not present

## 2016-10-16 DIAGNOSIS — Y999 Unspecified external cause status: Secondary | ICD-10-CM | POA: Diagnosis not present

## 2016-10-16 DIAGNOSIS — T50902A Poisoning by unspecified drugs, medicaments and biological substances, intentional self-harm, initial encounter: Secondary | ICD-10-CM

## 2016-10-16 DIAGNOSIS — E119 Type 2 diabetes mellitus without complications: Secondary | ICD-10-CM | POA: Diagnosis not present

## 2016-10-16 DIAGNOSIS — X838XXA Intentional self-harm by other specified means, initial encounter: Secondary | ICD-10-CM | POA: Diagnosis not present

## 2016-10-16 DIAGNOSIS — R55 Syncope and collapse: Secondary | ICD-10-CM | POA: Diagnosis not present

## 2016-10-16 DIAGNOSIS — J45909 Unspecified asthma, uncomplicated: Secondary | ICD-10-CM | POA: Diagnosis not present

## 2016-10-16 DIAGNOSIS — S21119A Laceration without foreign body of unspecified front wall of thorax without penetration into thoracic cavity, initial encounter: Secondary | ICD-10-CM | POA: Diagnosis not present

## 2016-10-16 DIAGNOSIS — Z7984 Long term (current) use of oral hypoglycemic drugs: Secondary | ICD-10-CM | POA: Diagnosis not present

## 2016-10-16 DIAGNOSIS — S51811A Laceration without foreign body of right forearm, initial encounter: Secondary | ICD-10-CM | POA: Diagnosis not present

## 2016-10-16 LAB — CBC
HCT: 41.5 % (ref 39.0–52.0)
Hemoglobin: 14.3 g/dL (ref 13.0–17.0)
MCH: 30 pg (ref 26.0–34.0)
MCHC: 34.5 g/dL (ref 30.0–36.0)
MCV: 87 fL (ref 78.0–100.0)
PLATELETS: 225 10*3/uL (ref 150–400)
RBC: 4.77 MIL/uL (ref 4.22–5.81)
RDW: 13.5 % (ref 11.5–15.5)
WBC: 9.2 10*3/uL (ref 4.0–10.5)

## 2016-10-16 MED ORDER — TETANUS-DIPHTH-ACELL PERTUSSIS 5-2.5-18.5 LF-MCG/0.5 IM SUSP
0.5000 mL | Freq: Once | INTRAMUSCULAR | Status: AC
  Administered 2016-10-17: 0.5 mL via INTRAMUSCULAR
  Filled 2016-10-16: qty 0.5

## 2016-10-16 MED ORDER — SODIUM CHLORIDE 0.9 % IV BOLUS (SEPSIS)
1000.0000 mL | Freq: Once | INTRAVENOUS | Status: AC
  Administered 2016-10-17: 1000 mL via INTRAVENOUS

## 2016-10-16 NOTE — ED Triage Notes (Signed)
Pt BIB [facility] from home. Mother called EMS pt ambulated to truck and stated 'he should probably lie down because he took a bottle of benadryl, clonidine, and gabapentin.' patient then laid down on stretcher and "went limp" per ems. Patiens VS remain WNL. Patient has cuts noted to his abdomen that he states were self inflicted by a razor blade. Patient is now sleeping. unable to wake and answer further questions at this time.

## 2016-10-16 NOTE — ED Notes (Signed)
Trying to place IV pt pull his arm away and told me to f--- myself informed Tresa Endo PA states she will fill out IVC papers.

## 2016-10-16 NOTE — ED Notes (Signed)
Poison control called and notified of patients ingestion. Spoke with Patty who suggests.  - monitor minimum of 6hrs -IV fluids -monitor for CNS, hypotension, bradycardia, respiratory depression for clonidine. Monitor CNS depression for gabapentin, and monitor HTN, tachycardia, drowsiness to agitation, urinary retention, and QRS or QTC changes  for benadryl.   - repeate EKG in 4 hrs.  - benzo (provider's choice) for agitation -manual stimulation for hypotension, or norepinephrine if that doesn't work.

## 2016-10-16 NOTE — ED Notes (Signed)
Delay in labs due to difficult iv start and uncooperative patient.

## 2016-10-16 NOTE — ED Notes (Signed)
Bed: RESB Expected date:  Expected time:  Means of arrival:  Comments: OD 

## 2016-10-17 ENCOUNTER — Other Ambulatory Visit: Payer: Self-pay

## 2016-10-17 ENCOUNTER — Emergency Department (HOSPITAL_COMMUNITY)
Admission: EM | Admit: 2016-10-17 | Discharge: 2016-10-18 | Disposition: A | Discharge status: Other Acute Inpatient Hospital | Payer: Medicare Other | Attending: Emergency Medicine | Admitting: Emergency Medicine

## 2016-10-17 DIAGNOSIS — J811 Chronic pulmonary edema: Secondary | ICD-10-CM

## 2016-10-17 DIAGNOSIS — S51812A Laceration without foreign body of left forearm, initial encounter: Secondary | ICD-10-CM | POA: Insufficient documentation

## 2016-10-17 DIAGNOSIS — J45909 Unspecified asthma, uncomplicated: Secondary | ICD-10-CM | POA: Insufficient documentation

## 2016-10-17 DIAGNOSIS — F1910 Other psychoactive substance abuse, uncomplicated: Secondary | ICD-10-CM

## 2016-10-17 DIAGNOSIS — Z7289 Other problems related to lifestyle: Secondary | ICD-10-CM

## 2016-10-17 DIAGNOSIS — Z9151 Personal history of suicidal behavior: Secondary | ICD-10-CM | POA: Diagnosis present

## 2016-10-17 DIAGNOSIS — T426X2A Poisoning by other antiepileptic and sedative-hypnotic drugs, intentional self-harm, initial encounter: Secondary | ICD-10-CM

## 2016-10-17 DIAGNOSIS — F17210 Nicotine dependence, cigarettes, uncomplicated: Secondary | ICD-10-CM | POA: Insufficient documentation

## 2016-10-17 DIAGNOSIS — Z7984 Long term (current) use of oral hypoglycemic drugs: Secondary | ICD-10-CM | POA: Insufficient documentation

## 2016-10-17 DIAGNOSIS — Z811 Family history of alcohol abuse and dependence: Secondary | ICD-10-CM

## 2016-10-17 DIAGNOSIS — T1491XA Suicide attempt, initial encounter: Secondary | ICD-10-CM

## 2016-10-17 DIAGNOSIS — T450X2A Poisoning by antiallergic and antiemetic drugs, intentional self-harm, initial encounter: Secondary | ICD-10-CM | POA: Insufficient documentation

## 2016-10-17 DIAGNOSIS — Z79899 Other long term (current) drug therapy: Secondary | ICD-10-CM | POA: Insufficient documentation

## 2016-10-17 DIAGNOSIS — E119 Type 2 diabetes mellitus without complications: Secondary | ICD-10-CM | POA: Insufficient documentation

## 2016-10-17 DIAGNOSIS — R55 Syncope and collapse: Secondary | ICD-10-CM

## 2016-10-17 DIAGNOSIS — Y929 Unspecified place or not applicable: Secondary | ICD-10-CM | POA: Insufficient documentation

## 2016-10-17 DIAGNOSIS — T465X2A Poisoning by other antihypertensive drugs, intentional self-harm, initial encounter: Secondary | ICD-10-CM | POA: Diagnosis not present

## 2016-10-17 DIAGNOSIS — Z818 Family history of other mental and behavioral disorders: Secondary | ICD-10-CM | POA: Diagnosis not present

## 2016-10-17 DIAGNOSIS — Z915 Personal history of self-harm: Secondary | ICD-10-CM | POA: Diagnosis present

## 2016-10-17 DIAGNOSIS — S31119A Laceration without foreign body of abdominal wall, unspecified quadrant without penetration into peritoneal cavity, initial encounter: Secondary | ICD-10-CM | POA: Insufficient documentation

## 2016-10-17 DIAGNOSIS — J9622 Acute and chronic respiratory failure with hypercapnia: Secondary | ICD-10-CM | POA: Diagnosis not present

## 2016-10-17 DIAGNOSIS — Z7982 Long term (current) use of aspirin: Secondary | ICD-10-CM | POA: Insufficient documentation

## 2016-10-17 DIAGNOSIS — Y999 Unspecified external cause status: Secondary | ICD-10-CM | POA: Insufficient documentation

## 2016-10-17 DIAGNOSIS — X838XXA Intentional self-harm by other specified means, initial encounter: Secondary | ICD-10-CM | POA: Insufficient documentation

## 2016-10-17 DIAGNOSIS — S21119A Laceration without foreign body of unspecified front wall of thorax without penetration into thoracic cavity, initial encounter: Secondary | ICD-10-CM | POA: Insufficient documentation

## 2016-10-17 DIAGNOSIS — T148XXA Other injury of unspecified body region, initial encounter: Secondary | ICD-10-CM

## 2016-10-17 DIAGNOSIS — T50902A Poisoning by unspecified drugs, medicaments and biological substances, intentional self-harm, initial encounter: Secondary | ICD-10-CM

## 2016-10-17 DIAGNOSIS — S51811A Laceration without foreign body of right forearm, initial encounter: Secondary | ICD-10-CM | POA: Insufficient documentation

## 2016-10-17 DIAGNOSIS — Y939 Activity, unspecified: Secondary | ICD-10-CM | POA: Insufficient documentation

## 2016-10-17 LAB — COMPREHENSIVE METABOLIC PANEL
ALK PHOS: 50 U/L (ref 38–126)
ALT: 88 U/L — ABNORMAL HIGH (ref 17–63)
ANION GAP: 7 (ref 5–15)
AST: 78 U/L — ABNORMAL HIGH (ref 15–41)
Albumin: 3.5 g/dL (ref 3.5–5.0)
BILIRUBIN TOTAL: 0.8 mg/dL (ref 0.3–1.2)
BUN: 10 mg/dL (ref 6–20)
CALCIUM: 8.9 mg/dL (ref 8.9–10.3)
CO2: 27 mmol/L (ref 22–32)
Chloride: 100 mmol/L — ABNORMAL LOW (ref 101–111)
Creatinine, Ser: 0.8 mg/dL (ref 0.61–1.24)
Glucose, Bld: 155 mg/dL — ABNORMAL HIGH (ref 65–99)
Potassium: 4.1 mmol/L (ref 3.5–5.1)
Sodium: 134 mmol/L — ABNORMAL LOW (ref 135–145)
TOTAL PROTEIN: 7.2 g/dL (ref 6.5–8.1)

## 2016-10-17 LAB — RAPID URINE DRUG SCREEN, HOSP PERFORMED
Amphetamines: NOT DETECTED
BARBITURATES: NOT DETECTED
Benzodiazepines: POSITIVE — AB
Cocaine: POSITIVE — AB
Opiates: POSITIVE — AB
Tetrahydrocannabinol: NOT DETECTED

## 2016-10-17 LAB — ACETAMINOPHEN LEVEL

## 2016-10-17 LAB — SALICYLATE LEVEL

## 2016-10-17 LAB — ETHANOL

## 2016-10-17 MED ORDER — TRAZODONE HCL 50 MG PO TABS
50.0000 mg | ORAL_TABLET | Freq: Every day | ORAL | Status: DC
  Administered 2016-10-17: 50 mg via ORAL
  Filled 2016-10-17: qty 1

## 2016-10-17 MED ORDER — AMPHETAMINE-DEXTROAMPHETAMINE 20 MG PO TABS
60.0000 mg | ORAL_TABLET | Freq: Every day | ORAL | Status: DC
  Filled 2016-10-17 (×2): qty 3

## 2016-10-17 MED ORDER — NAPROXEN 500 MG PO TABS: 500.0000 mg | ORAL_TABLET | Freq: Two times a day (BID) | ORAL | Status: DC | PRN

## 2016-10-17 MED ORDER — IPRATROPIUM-ALBUTEROL 20-100 MCG/ACT IN AERS
1.0000 | INHALATION_SPRAY | Freq: Every day | RESPIRATORY_TRACT | Status: DC
  Filled 2016-10-17: qty 4

## 2016-10-17 MED ORDER — NICOTINE 21 MG/24HR TD PT24
21.0000 mg | MEDICATED_PATCH | Freq: Every day | TRANSDERMAL | Status: DC
  Filled 2016-10-17 (×2): qty 1

## 2016-10-17 MED ORDER — SODIUM CHLORIDE 0.9 % IV BOLUS (SEPSIS)
1000.0000 mL | Freq: Once | INTRAVENOUS | Status: AC
  Administered 2016-10-17: 1000 mL via INTRAVENOUS

## 2016-10-17 MED ORDER — LOPERAMIDE HCL 2 MG PO CAPS: 2.0000 mg | ORAL_CAPSULE | ORAL | Status: DC | PRN

## 2016-10-17 MED ORDER — LINAGLIPTIN 5 MG PO TABS
5.0000 mg | ORAL_TABLET | Freq: Every day | ORAL | Status: DC
  Administered 2016-10-17: 5 mg via ORAL
  Filled 2016-10-17 (×2): qty 1

## 2016-10-17 MED ORDER — FUROSEMIDE 40 MG PO TABS
40.0000 mg | ORAL_TABLET | Freq: Every day | ORAL | Status: DC
  Filled 2016-10-17 (×3): qty 1

## 2016-10-17 MED ORDER — INSULIN GLARGINE 100 UNIT/ML ~~LOC~~ SOLN
20.0000 [IU] | Freq: Every day | SUBCUTANEOUS | Status: DC
  Administered 2016-10-17: 20 [IU] via SUBCUTANEOUS
  Filled 2016-10-17: qty 0.2

## 2016-10-17 MED ORDER — METOPROLOL TARTRATE 25 MG PO TABS
50.0000 mg | ORAL_TABLET | Freq: Two times a day (BID) | ORAL | Status: DC
  Administered 2016-10-17 – 2016-10-18 (×3): 50 mg via ORAL
  Filled 2016-10-17 (×3): qty 2

## 2016-10-17 MED ORDER — ZIPRASIDONE MESYLATE 20 MG IM SOLR
20.0000 mg | Freq: Once | INTRAMUSCULAR | Status: AC
  Administered 2016-10-17: 20 mg via INTRAMUSCULAR
  Filled 2016-10-17: qty 20

## 2016-10-17 MED ORDER — AMPHETAMINE-DEXTROAMPHETAMINE 20 MG PO TABS: 30.0000 mg | ORAL_TABLET | Freq: Every evening | ORAL | Status: DC

## 2016-10-17 MED ORDER — GABAPENTIN 400 MG PO CAPS
800.0000 mg | ORAL_CAPSULE | Freq: Three times a day (TID) | ORAL | Status: DC
  Administered 2016-10-17 – 2016-10-18 (×4): 800 mg via ORAL
  Filled 2016-10-17 (×4): qty 2

## 2016-10-17 MED ORDER — CLONIDINE HCL 0.1 MG PO TABS
0.1000 mg | ORAL_TABLET | Freq: Every day | ORAL | Status: DC
  Administered 2016-10-17: 0.1 mg via ORAL

## 2016-10-17 MED ORDER — AMPHETAMINE-DEXTROAMPHETAMINE 30 MG PO TABS: 30.0000 mg | ORAL_TABLET | Freq: Two times a day (BID) | ORAL | Status: DC

## 2016-10-17 MED ORDER — ATORVASTATIN CALCIUM 20 MG PO TABS
20.0000 mg | ORAL_TABLET | Freq: Every day | ORAL | Status: DC
  Administered 2016-10-17: 20 mg via ORAL
  Filled 2016-10-17: qty 1

## 2016-10-17 MED ORDER — DICYCLOMINE HCL 20 MG PO TABS
20.0000 mg | ORAL_TABLET | Freq: Four times a day (QID) | ORAL | Status: DC | PRN
  Administered 2016-10-17: 20 mg via ORAL
  Filled 2016-10-17: qty 1

## 2016-10-17 MED ORDER — METHOCARBAMOL 500 MG PO TABS
500.0000 mg | ORAL_TABLET | Freq: Three times a day (TID) | ORAL | Status: DC | PRN
  Administered 2016-10-17: 500 mg via ORAL
  Filled 2016-10-17: qty 1

## 2016-10-17 MED ORDER — FLUTICASONE FUROATE-VILANTEROL 200-25 MCG/INH IN AEPB
1.0000 | INHALATION_SPRAY | Freq: Every day | RESPIRATORY_TRACT | Status: DC
  Filled 2016-10-17: qty 28

## 2016-10-17 MED ORDER — METFORMIN HCL 500 MG PO TABS
1000.0000 mg | ORAL_TABLET | Freq: Two times a day (BID) | ORAL | Status: DC
  Administered 2016-10-17 – 2016-10-18 (×3): 1000 mg via ORAL
  Filled 2016-10-17 (×3): qty 2

## 2016-10-17 MED ORDER — ONDANSETRON HCL 4 MG PO TABS: 4.0000 mg | ORAL_TABLET | Freq: Three times a day (TID) | ORAL | Status: DC | PRN

## 2016-10-17 MED ORDER — STERILE WATER FOR INJECTION IJ SOLN
INTRAMUSCULAR | Status: AC
  Administered 2016-10-17: 10 mL
  Filled 2016-10-17: qty 10

## 2016-10-17 MED ORDER — CLONIDINE HCL 0.1 MG PO TABS: 0.1000 mg | ORAL_TABLET | Freq: Two times a day (BID) | ORAL | Status: DC

## 2016-10-17 MED ORDER — HYDROXYZINE HCL 25 MG PO TABS
25.0000 mg | ORAL_TABLET | Freq: Four times a day (QID) | ORAL | Status: DC | PRN
  Administered 2016-10-17 – 2016-10-18 (×2): 25 mg via ORAL
  Filled 2016-10-17 (×2): qty 1

## 2016-10-17 MED ORDER — SODIUM CHLORIDE 0.9 % IV BOLUS (SEPSIS): 1000.0000 mL | Freq: Once | INTRAVENOUS | Status: DC

## 2016-10-17 MED ORDER — CLONIDINE HCL 0.1 MG PO TABS
0.1000 mg | ORAL_TABLET | Freq: Four times a day (QID) | ORAL | Status: DC
  Administered 2016-10-17 – 2016-10-18 (×2): 0.1 mg via ORAL
  Filled 2016-10-17 (×3): qty 1

## 2016-10-17 NOTE — ED Provider Notes (Signed)
2:00 AM Patient seen and evaluated on arrival to ED at 2300. Chart entered under [facility], MRN [id number]. Patient under IVC for SI, self harm, and reported overdose.   Antony Madura, PA-C 10/17/16 [id number]    Linwood Dibbles, MD 10/18/16 [phone]
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
## 2016-10-17 NOTE — ED Notes (Signed)
Spoke with Misty Stanley in [facility] who will order and obtain CPAP for pt in room 27, report called to [employer]

## 2016-10-17 NOTE — ED Provider Notes (Signed)
[facility] DEPT Provider Note   CSN: [id number] Arrival date & time: 10/17/16  0148     History   Chief Complaint No chief complaint on file.   HPI Bryan Gallegos is a 41 y.o. male.  41 year old male presents to the emergency department by EMS. He reports taking a bottle of Benadryl and clonidine prior to arrival. Time of ingestion is unknown and patient is not forthcoming with circumstances surrounding ingestion. He states that he doesn't care if he dies and to not resuscitate him should he require this. EMS states that patient's son recently suffered an overdose and his wife recently passed away. Patient denies pain and cannot recall the date of his last tetanus shot.   The history is provided by the patient. No language interpreter was used.    Past Medical History:  Diagnosis Date  . Adult ADHD   . Anaphylactic reaction   . Anxiety   . Asthma   . Bipolar disorder ([place])   . Complication of anesthesia    Per patient difficult intubation;  . Diabetes mellitus   . Difficult intubation    Per patient  . Drug overdose   . Heart valve disorder    s/p echocardiogram  . Hyperlipidemia   . Hypertension   . Morbidly obese ([place])   . OSA (obstructive sleep apnea)   . Polysubstance abuse   . Transient cerebral ischemia    Unknown    Patient Active Problem List   Diagnosis Date Noted  . Acute and chronic respiratory failure with hypercapnia ([place])   . Pulmonary edema   . Syncope   . Respiratory failure ([place]) 01/04/2016  . Drug overdose   . Acute encephalopathy 01/01/2016  . Hyponatremia 01/01/2016  . Chronic pain syndrome 10/22/2015  . Healthcare maintenance 10/22/2015  . Hoarse voice quality 10/11/2015  . Major depressive disorder, recurrent severe without psychotic features ([place]) 09/04/2015  . Polysubstance dependence including opioid type drug, episodic abuse ([place]) 09/04/2015  . RUQ abdominal pain   . Cholelithiasis 05/17/2015  . Antisocial personality  disorder 04/11/2015  . Opioid use disorder, severe, dependence ([place]) 04/11/2015  . Benzodiazepine dependence ([place]) 04/11/2015  . Tobacco use disorder 04/10/2015  . Narcotic dependency, continuous ([place]) 03/14/2014  . Chronic back pain greater than 3 months duration 01/28/2014  . Gastroesophageal reflux disease with esophagitis 01/28/2014  . Essential hypertension, benign 01/28/2014  . Diabetes mellitus type II, controlled ([place]) 01/28/2014  . Morbid obesity ([place]) 01/28/2014  . TIA (transient ischemic attack) 09/02/2013  . HLD (hyperlipidemia) 08/06/2013  . OSA on CPAP 08/06/2013    Past Surgical History:  Procedure Laterality Date  . CARPAL TUNNEL RELEASE    . ESOPHAGOGASTRODUODENOSCOPY N/A 04/05/2015   Procedure: ESOPHAGOGASTRODUODENOSCOPY (EGD);  Surgeon: Rogene Houston, MD;  [facility];  Service: Endoscopy;  Laterality: N/A;  . ESOPHAGOGASTRODUODENOSCOPY (EGD) WITH PROPOFOL N/A 05/21/2015   Procedure: ESOPHAGOGASTRODUODENOSCOPY (EGD) WITH PROPOFOL;  Surgeon: Ladene Artist, MD;  Location: [facility] ENDOSCOPY;  Service: Endoscopy;  Laterality: N/A;  . NOSE SURGERY    . TOOTH EXTRACTION    . WISDOM TOOTH EXTRACTION         Home Medications    Prior to Admission medications   Medication Sig Start Date End Date Taking? Authorizing Provider  ACCU-CHEK SOFTCLIX LANCETS lancets Use as instructed for 3 times daily testing of blood sugar 11/10/15   Tresa Garter, MD  acetaminophen (TYLENOL) 325 MG tablet Take 650 mg by mouth every 6 (six) hours as  needed for mild pain or headache.    [provider]  albuterol (PROVENTIL HFA;VENTOLIN HFA) 108 (90 Base) MCG/ACT inhaler Inhale 1-2 puffs into the lungs every 6 (six) hours as needed for wheezing or shortness of breath. 11/20/15   Delos Haring, PA-C  alprazolam Duanne Moron) 2 MG tablet Take 1-2 mg by mouth 3 (three) times daily as needed for sleep or anxiety.     [provider]  amphetamine-dextroamphetamine  (ADDERALL) 30 MG tablet Take 30-60 mg by mouth 2 (two) times daily. Take '60mg'$  in the morning and '30mg'$  in the evening    [provider]  aspirin 325 MG tablet Take 1 tablet (325 mg total) by mouth daily. For heart health 09/05/15   Lindell Spar I, NP  atorvastatin (LIPITOR) 20 MG tablet Take 20 mg by mouth at bedtime. 07/21/16   [provider]  blood glucose meter kit and supplies KIT Dispense based on patient and insurance preference. Use up to four times daily as directed. (FOR ICD-9 250.00, 250.01). 10/13/15   Samuella Cota, MD  Blood Glucose Monitoring Suppl (ACCU-CHEK AVIVA PLUS) w/Device KIT Use as directed to check blood sugar 3 times daily. 11/10/15   Tresa Garter, MD  citalopram (CELEXA) 20 MG tablet Take 1 tablet (20 mg total) by mouth every morning. Patient taking differently: Take 20 mg by mouth daily.  10/15/15   Elsie Stain, MD  clindamycin (CLEOCIN) 150 MG capsule 3 tabs PO TID x 10 days 09/11/16   Francine Graven, DO  cloNIDine (CATAPRES) 0.1 MG tablet Take 2 tablets (0.2 mg total) by mouth 2 (two) times daily. Patient taking differently: Take 0.2 mg by mouth daily.  10/22/15   Tresa Garter, MD  COMBIVENT RESPIMAT 20-100 MCG/ACT AERS respimat Take 1 puff by mouth daily. 06/24/16   [provider]  divalproex (DEPAKOTE) 500 MG DR tablet TAKE 1 TABLET BY MOUTH 2 TIMES DAILY. Patient taking differently: TAKE 1 TABLET BY MOUTH daily 03/03/16   Tresa Garter, MD  DULoxetine (CYMBALTA) 30 MG capsule Take 1 capsule by mouth daily. 07/21/16   [provider]  Fluticasone-Salmeterol (ADVAIR) 250-50 MCG/DOSE AEPB Inhale 1 puff into the lungs 2 (two) times daily.    [provider]  furosemide (LASIX) 40 MG tablet Take 40 mg by mouth daily. 06/24/16   [provider]  gabapentin (NEURONTIN) 800 MG tablet Take 800 mg by mouth 3 (three) times daily. 12/16/15   [provider]  glucose blood (ACCU-CHEK AVIVA)  test strip Use as instructed for 3 times daily testing of blood sugar 11/10/15   Jegede, Olugbemiga E, MD  insulin glargine (LANTUS) 100 UNIT/ML injection Inject 0.2 mLs (20 Units total) into the skin at bedtime. Patient taking differently: Inject 20 Units into the skin at bedtime as needed.  10/15/15   Elsie Stain, MD  JANUVIA 100 MG tablet Take 100 mg by mouth daily. 07/21/16   [provider]  Lancet Devices [facility]) lancets Use as instructed for 3 times daily testing of blood sugar 11/10/15   Jegede, Olugbemiga E, MD  metFORMIN (GLUCOPHAGE) 1000 MG tablet Take 1 tablet (1,000 mg total) by mouth 2 (two) times daily. Patient taking differently: Take 1,000 mg by mouth daily.  10/15/15   Elsie Stain, MD  metoprolol tartrate (LOPRESSOR) 50 MG tablet Take 50 mg by mouth 2 (two) times daily. 07/21/16   [provider]  omeprazole (PRILOSEC) 40 MG capsule Take 1 capsule (40 mg  total) by mouth 2 (two) times daily. 10/15/15   Elsie Stain, MD  ranitidine (ZANTAC) 150 MG tablet Take 1 tablet by mouth 2 (two) times daily. 07/21/16   [provider]  testosterone cypionate (DEPOTESTOSTERONE CYPIONATE) 200 MG/ML injection Inject 2 mLs into the muscle every 30 (thirty) days. 06/25/16   [provider]  tiZANidine (ZANAFLEX) 4 MG tablet Take 1 tablet by mouth 3 (three) times daily. 07/21/16   [provider]  traZODone (DESYREL) 50 MG tablet Take 1 tablet (50 mg total) by mouth at bedtime. For sleep Patient not taking: Reported on 09/07/2016 10/15/15   Elsie Stain, MD  valsartan (DIOVAN) 160 MG tablet Take 160 mg by mouth daily. 07/21/16   [provider]    Family History Family History  Problem Relation Age of Onset  . CAD Mother        Living  . Diabetes Mellitus II Mother   . Stroke Mother   . Hypertension Mother   . Congestive Heart Failure Mother   . Kidney disease Mother   . Fibromyalgia Mother   . Thyroid disease  Mother   . Hyperlipidemia Mother   . Liver disease Mother   . Alcoholism Father 38       Deceased  . Arthritis Maternal Grandmother   . Congestive Heart Failure Maternal Grandmother   . Hypertension Maternal Grandmother   . Lung cancer Maternal Grandfather   . Colon cancer Maternal Aunt   . Stomach cancer Maternal Aunt   . Heart disease Other        Paternal & Maternal  . Crohn's disease Other   . Hypertension Other        Paternal & Maternal  . Hypertension Brother        x3  . Hypertension Sister        #1  . Bipolar disorder Sister        #1  . ADD / ADHD Son        x3  . Bipolar disorder Son        x3  . Asperger's syndrome Son     Social History Social History  Substance Use Topics  . Smoking status: Current Every Day Smoker    Packs/day: 0.50    Years: 23.00    Types: Cigarettes  . Smokeless tobacco: Never Used  . Alcohol use No     Allergies   Atenolol; Lisinopril; Prednisone; Ibuprofen; and Tylenol [acetaminophen]   Review of Systems Review of Systems Ten systems reviewed and are negative for acute change, except as noted in the HPI.    Physical Exam Updated Vital Signs BP 115/67   Pulse 62   SpO2 97%   Physical Exam  Constitutional: He is oriented to person, place, and time. He appears well-developed and well-nourished. No distress.  Nontoxic and in NAD  HENT:  Head: Normocephalic and atraumatic.  Eyes: Conjunctivae and EOM are normal. No scleral icterus.  Neck: Normal range of motion.  Cardiovascular: Normal rate, regular rhythm and intact distal pulses.   Pulmonary/Chest: Effort normal. No respiratory distress. He has no wheezes.  Respirations even and unlabored  Musculoskeletal: Normal range of motion.  Neurological: He is alert and oriented to person, place, and time. He exhibits normal muscle tone. Coordination normal.  GCS 15. Speech is goal oriented. Patient moving all extremities.  Skin: Skin is warm and dry. No rash noted. He is  not diaphoretic. No erythema. No pallor.  Multiple superficial lacerations  to chest, abdomen, and bilateral forearms.  Psychiatric: He is withdrawn. He exhibits a depressed mood. He expresses no homicidal ideation. He expresses no homicidal plans.  Nursing note and vitals reviewed.    ED Treatments / Results  Labs (all labs ordered are listed, but only abnormal results are displayed) Labs Reviewed - No data to display  EKG  EKG Interpretation None       Radiology No results found.  Procedures Procedures (including critical care time)  Medications Ordered in ED Medications  ondansetron (ZOFRAN) tablet 4 mg (not administered)  nicotine (NICODERM CQ - dosed in mg/24 hours) patch 21 mg (not administered)     Initial Impression / Assessment and Plan / ED Course  I have reviewed the triage vital signs and the nursing notes.  Pertinent labs & imaging results that were available during my care of the patient were reviewed by me and considered in my medical decision making (see chart for details).     11:05 PM Patient refusing blood draw or additional treatment. IVC papers taken out in the setting of actions with suicidal intent.  5:05 AM Patient hemodynamically stable. EKG repeated and unchanged from arrival. Poison control aware. Patient medically cleared.  5:40 AM Patient evaluated by TTS. He has been recommended for inpatient treatment. Disposition to be determined by oncoming ED provider.   Final Clinical Impressions(s) / ED Diagnoses   Final diagnoses:  Intentional drug overdose, initial encounter [facility])  Self-injurious behavior  Superficial laceration    New Prescriptions New Prescriptions   No medications on file     Antonietta Breach, Hershal Coria 10/17/16 0543    Dorie Rank, MD 10/18/16 [phone]

## 2016-10-17 NOTE — ED Provider Notes (Signed)
Pt became very agitated when he did not get narcotics.  I spoke with him and he punched the computer.  Pt given geodon and narcotic withdrawal orders started.   Jacalyn Lefevre, MD 10/17/16 [phone]

## 2016-10-17 NOTE — ED Notes (Signed)
Patient c/o left leg pain and numbness. States he cannot bend it.

## 2016-10-17 NOTE — Progress Notes (Signed)
Per psychiatrist Jonnalagadda and NP [facility], patient meets inpatient criteria. CSW faxed patient's referral to: [facility], [facility] [place], [facility], [facility], [place], [facility] and [place].   Celso Sickle, LCSWA [facility]  Clinical Social Worker [phone]

## 2016-10-17 NOTE — ED Notes (Signed)
Bed: WA27 Expected date:  Expected time:  Means of arrival:  Comments: 

## 2016-10-18 DIAGNOSIS — Z818 Family history of other mental and behavioral disorders: Secondary | ICD-10-CM | POA: Diagnosis not present

## 2016-10-18 DIAGNOSIS — T450X2A Poisoning by antiallergic and antiemetic drugs, intentional self-harm, initial encounter: Secondary | ICD-10-CM | POA: Diagnosis not present

## 2016-10-18 DIAGNOSIS — T465X2A Poisoning by other antihypertensive drugs, intentional self-harm, initial encounter: Secondary | ICD-10-CM | POA: Diagnosis not present

## 2016-10-18 DIAGNOSIS — T1491XA Suicide attempt, initial encounter: Secondary | ICD-10-CM | POA: Diagnosis not present

## 2016-10-18 DIAGNOSIS — Z811 Family history of alcohol abuse and dependence: Secondary | ICD-10-CM | POA: Diagnosis not present

## 2016-10-18 DIAGNOSIS — F1910 Other psychoactive substance abuse, uncomplicated: Secondary | ICD-10-CM | POA: Diagnosis not present

## 2016-10-18 DIAGNOSIS — R55 Syncope and collapse: Secondary | ICD-10-CM | POA: Diagnosis not present

## 2016-10-18 DIAGNOSIS — J811 Chronic pulmonary edema: Secondary | ICD-10-CM | POA: Diagnosis not present

## 2016-10-18 DIAGNOSIS — J9622 Acute and chronic respiratory failure with hypercapnia: Secondary | ICD-10-CM | POA: Diagnosis not present

## 2016-10-18 DIAGNOSIS — T426X2A Poisoning by other antiepileptic and sedative-hypnotic drugs, intentional self-harm, initial encounter: Secondary | ICD-10-CM | POA: Diagnosis not present

## 2016-10-18 DIAGNOSIS — F17210 Nicotine dependence, cigarettes, uncomplicated: Secondary | ICD-10-CM | POA: Diagnosis not present

## 2016-10-18 LAB — CBG MONITORING, ED: Glucose-Capillary: 146 mg/dL — ABNORMAL HIGH (ref 65–99)

## 2016-10-18 NOTE — [facility] Suicide Risk Assessment (Signed)
Suicide Risk Assessment  Discharge Assessment   [facility] Discharge Suicide Risk Assessment   Principal Problem: Suicide attempt [facility]) Discharge Diagnoses:  Patient Active Problem List   Diagnosis Date Noted  . Suicide attempt (HCC) [T14.91XA] 10/17/2016  . Acute and chronic respiratory failure with hypercapnia (HCC) [J96.22]   . Pulmonary edema [J81.1]   . Syncope [R55]   . Respiratory failure (HCC) [J96.90] 01/04/2016  . Drug overdose [T50.901A]   . Acute encephalopathy [G93.40] 01/01/2016  . Hyponatremia [E87.1] 01/01/2016  . Chronic pain syndrome [G89.4] 10/22/2015  . Healthcare maintenance [Z00.00] 10/22/2015  . Hoarse voice quality [R49.0] 10/11/2015  . Major depressive disorder, recurrent severe without psychotic features (HCC) [F33.2] 09/04/2015  . Polysubstance dependence including opioid type drug, episodic abuse (HCC) [F11.20, F19.20] 09/04/2015  . RUQ abdominal pain [R10.11]   . Cholelithiasis [K80.20] 05/17/2015  . Antisocial personality disorder [F60.2] 04/11/2015  . Opioid use disorder, severe, dependence (HCC) [F11.20] 04/11/2015  . Benzodiazepine dependence (HCC) [F13.20] 04/11/2015  . Tobacco use disorder [F17.200] 04/10/2015  . Narcotic dependency, continuous (HCC) [F11.20] 03/14/2014  . Chronic back pain greater than 3 months duration [M54.9, G89.29] 01/28/2014  . Gastroesophageal reflux disease with esophagitis [K21.0] 01/28/2014  . Essential hypertension, benign [I10] 01/28/2014  . Diabetes mellitus type II, controlled (HCC) [E11.9] 01/28/2014  . Morbid obesity (HCC) [E66.01] 01/28/2014  . TIA (transient ischemic attack) [G45.9] 09/02/2013  . HLD (hyperlipidemia) [E78.5] 08/06/2013  . OSA on CPAP [G47.33, Z99.89] 08/06/2013    Total Time spent with patient: 30 minutes  Musculoskeletal: Strength & Muscle Tone: within normal limits Gait & Station: normal Patient leans: N/A  Psychiatric Specialty Exam: Physical Exam  Constitutional: He is oriented to  person, place, and time. He appears well-developed and well-nourished.  Respiratory: Effort normal.  Musculoskeletal: Normal range of motion.  Neurological: He is alert and oriented to person, place, and time.   Review of Systems  Psychiatric/Behavioral: Positive for depression, substance abuse and suicidal ideas. Negative for hallucinations and memory loss. The patient is not nervous/anxious and does not have insomnia.    Blood pressure 126/89, pulse 63, temperature (!) 97.5 F (36.4 C), temperature source Oral, resp. rate 18, SpO2 99 %.There is no height or weight on file to calculate BMI. General Appearance: Disheveled Eye Contact:  Good Speech:  Clear and Coherent and Pressured Volume:  Normal Mood:  Anxious Affect:  Congruent and Labile Thought Process:  Coherent, Goal Directed and Linear Orientation:  Full (Time, Place, and Person) Thought Content:  Logical Suicidal Thoughts:  Yes.  with intent/plan Homicidal Thoughts:  No Memory:  Immediate;   Good Recent;   Good Remote;   Fair Judgement:  Poor Insight:  Lacking Psychomotor Activity:  Normal and Increased Concentration:  Concentration: Good and Attention Span: Good Recall:  Good Fund of Knowledge:  Good Language:  Good Akathisia:  No Handed:  Right AIMS (if indicated):    Assets:  Communication Skills Desire for Improvement Financial Resources/Insurance Housing Resilience Social Support ADL's:  Intact Cognition:  WNL  Mental Status Per Nursing Assessment::   On Admission:   suicidal and agitated  Demographic Factors:  Male, Caucasian and Unemployed  Loss Factors: Loss of significant relationship  Historical Factors: Impulsivity  Risk Reduction Factors:   Sense of responsibility to family and Living with another person, especially a relative  Continued Clinical Symptoms:  Depression:   Impulsivity Alcohol/Substance Abuse/Dependencies More than one psychiatric diagnosis Previous Psychiatric Diagnoses  and Treatments  Cognitive Features That Contribute To Risk:  Closed-mindedness    Suicide Risk:  Severe:  Frequent, intense, and enduring suicidal ideation, specific plan, no subjective intent, but some objective markers of intent (i.e., choice of lethal method), the method is accessible, some limited preparatory behavior, evidence of impaired self-control, severe dysphoria/symptomatology, multiple risk factors present, and few if any protective factors, particularly a lack of social support.    Plan Of Care/Follow-up recommendations:  Activity:  as tolerated Diet:  Heart Healthy  Laveda Abbe, NP 10/18/2016, 2:49 PM

## 2016-10-18 NOTE — ED Notes (Signed)
Sheriff called for transport  

## 2016-10-18 NOTE — Consult Note (Signed)
[facility] Face-to-Face Psychiatry Consult   Reason for Consult:  Suicide attempt Referring Physician:  EDP Patient Identification: Bryan Gallegos MRN:  [id number] Principal Diagnosis: Suicide attempt [facility]) Diagnosis:   Patient Active Problem List   Diagnosis Date Noted  . Suicide attempt (HCC) [T14.91XA] 10/17/2016  . Acute and chronic respiratory failure with hypercapnia (HCC) [J96.22]   . Pulmonary edema [J81.1]   . Syncope [R55]   . Respiratory failure (HCC) [J96.90] 01/04/2016  . Drug overdose [T50.901A]   . Acute encephalopathy [G93.40] 01/01/2016  . Hyponatremia [E87.1] 01/01/2016  . Chronic pain syndrome [G89.4] 10/22/2015  . Healthcare maintenance [Z00.00] 10/22/2015  . Hoarse voice quality [R49.0] 10/11/2015  . Major depressive disorder, recurrent severe without psychotic features (HCC) [F33.2] 09/04/2015  . Polysubstance dependence including opioid type drug, episodic abuse (HCC) [F11.20, F19.20] 09/04/2015  . RUQ abdominal pain [R10.11]   . Cholelithiasis [K80.20] 05/17/2015  . Antisocial personality disorder [F60.2] 04/11/2015  . Opioid use disorder, severe, dependence (HCC) [F11.20] 04/11/2015  . Benzodiazepine dependence (HCC) [F13.20] 04/11/2015  . Tobacco use disorder [F17.200] 04/10/2015  . Narcotic dependency, continuous (HCC) [F11.20] 03/14/2014  . Chronic back pain greater than 3 months duration [M54.9, G89.29] 01/28/2014  . Gastroesophageal reflux disease with esophagitis [K21.0] 01/28/2014  . Essential hypertension, benign [I10] 01/28/2014  . Diabetes mellitus type II, controlled (HCC) [E11.9] 01/28/2014  . Morbid obesity (HCC) [E66.01] 01/28/2014  . TIA (transient ischemic attack) [G45.9] 09/02/2013  . HLD (hyperlipidemia) [E78.5] 08/06/2013  . OSA on CPAP [G47.33, Z99.89] 08/06/2013    Total Time spent with patient: 30 minutes  Subjective:   Bryan Gallegos is a 41 y.o. male patient admitted with suicide attempt.  HPI:  Pt was discharged to [facility] for an attempted suicide by overdose. Pt also had   Past Psychiatric History: As above   Risk to Self: Suicidal Ideation: Yes-Currently Present Suicidal Intent: Yes-Currently Present Is patient at risk for suicide?: Yes Suicidal Plan?: Yes-Currently Present Specify Current Suicidal Plan: pt attempted to OD on medication PTA Access to Means: Yes Specify Access to Suicidal Means: pt has access to medication  What has been your use of drugs/alcohol within the last 12 months?: reports to using cocaine for the first time in 6 years on 10/16/16 Triggers for Past Attempts: None known Intentional Self Injurious Behavior: Cutting Comment - Self Injurious Behavior: pt cut himself and has many lacerations on his stomach and harms due to self-inflicted cuts with a razor  Risk to Others: None Prior Inpatient Therapy: Prior Inpatient Therapy: Yes Prior Therapy Dates: 06/2016, mulitple admits Prior Therapy Facilty/Provider(s): [facility], [facility], multiple facilities Reason for Treatment: Bipolar disorder, substance abuse Prior Outpatient Therapy: Prior Outpatient Therapy: No Does patient have an ACCT team?: No Does patient have Intensive In-House Services?  : No Does patient have [facility] services? : No Does patient have P4CC services?: No  Past Medical History:  Past Medical History:  Diagnosis Date  . Adult ADHD   . Anaphylactic reaction   . Anxiety   . Asthma   . Bipolar disorder (HCC)   . Complication of anesthesia    Per patient difficult intubation;  . Diabetes mellitus   . Difficult intubation    Per patient  . Drug overdose   . Heart valve disorder    s/p echocardiogram  . Hyperlipidemia   . Hypertension   . Morbidly obese (HCC)   . OSA (obstructive sleep apnea)   . Polysubstance abuse   . Transient  cerebral ischemia    Unknown    Past Surgical History:  Procedure Laterality Date  . CARPAL TUNNEL RELEASE    . ESOPHAGOGASTRODUODENOSCOPY N/A 04/05/2015    Procedure: ESOPHAGOGASTRODUODENOSCOPY (EGD);  Surgeon: Malissa Hippo, MD;  Location: [facility];  Service: Endoscopy;  Laterality: N/A;  . ESOPHAGOGASTRODUODENOSCOPY (EGD) WITH PROPOFOL N/A 05/21/2015   Procedure: ESOPHAGOGASTRODUODENOSCOPY (EGD) WITH PROPOFOL;  Surgeon: Meryl Dare, MD;  Location: [facility] ENDOSCOPY;  Service: Endoscopy;  Laterality: N/A;  . NOSE SURGERY    . TOOTH EXTRACTION    . WISDOM TOOTH EXTRACTION     Family History:  Family History  Problem Relation Age of Onset  . CAD Mother        Living  . Diabetes Mellitus II Mother   . Stroke Mother   . Hypertension Mother   . Congestive Heart Failure Mother   . Kidney disease Mother   . Fibromyalgia Mother   . Thyroid disease Mother   . Hyperlipidemia Mother   . Liver disease Mother   . Alcoholism Father 60       Deceased  . Arthritis Maternal Grandmother   . Congestive Heart Failure Maternal Grandmother   . Hypertension Maternal Grandmother   . Lung cancer Maternal Grandfather   . Colon cancer Maternal Aunt   . Stomach cancer Maternal Aunt   . Heart disease Other        Paternal & Maternal  . Crohn's disease Other   . Hypertension Other        Paternal & Maternal  . Hypertension Brother        x3  . Hypertension Sister        #1  . Bipolar disorder Sister        #1  . ADD / ADHD Son        x3  . Bipolar disorder Son        x3  . Asperger's syndrome Son    Family Psychiatric  History: Unknown Social History:  History  Alcohol Use No     History  Drug Use No    Social History   Social History  . Marital status: Single    Spouse name: N/A  . Number of children: N/A  . Years of education: N/A   Social History Main Topics  . Smoking status: Current Every Day Smoker    Packs/day: 0.50    Years: 23.00    Types: Cigarettes  . Smokeless tobacco: Never Used  . Alcohol use No  . Drug use: No  . Sexual activity: Not Currently    Partners: Female    Birth control/ protection: None   Other  Topics Concern  . Not on file   Social History Narrative  . No narrative on file   Additional Social History:    Allergies:   Allergies  Allergen Reactions  . Ace Inhibitors Anaphylaxis  . Atenolol Anaphylaxis  . Lisinopril Anaphylaxis  . Prednisone Hives  . Ibuprofen Itching, Swelling and Other (See Comments)    Hand/feet swelling   . Tylenol [Acetaminophen] Other (See Comments)    Reaction:  GI bleeding     Labs:  Results for orders placed or performed during the hospital encounter of 10/17/16 (from the past 48 hour(s))  CBG monitoring, ED     Status: Abnormal   Collection Time: 10/17/16  9:19 PM  Result Value Ref Range   Glucose-Capillary 146 (H) 65 - 99 mg/dL    No current facility-administered medications  for this encounter.    Current Outpatient Prescriptions  Medication Sig Dispense Refill  . albuterol (PROVENTIL HFA;VENTOLIN HFA) 108 (90 Base) MCG/ACT inhaler Inhale 1-2 puffs into the lungs every 6 (six) hours as needed for wheezing or shortness of breath. 1 Inhaler 0  . alprazolam (XANAX) 2 MG tablet Take 2 mg by mouth 3 (three) times daily.    Marland Kitchen amphetamine-dextroamphetamine (ADDERALL) 30 MG tablet Take 30-60 mg by mouth 2 (two) times daily. Pt takes two tablets in the morning and one in the evening.    Marland Kitchen aspirin 325 MG tablet Take 1 tablet (325 mg total) by mouth daily. For heart health 30 tablet 0  . atorvastatin (LIPITOR) 20 MG tablet Take 20 mg by mouth at bedtime.  1  . citalopram (CELEXA) 20 MG tablet Take 1 tablet (20 mg total) by mouth every morning. 30 tablet 6  . cloNIDine (CATAPRES) 0.2 MG tablet Take 0.2 mg by mouth 2 (two) times daily.    . COMBIVENT RESPIMAT 20-100 MCG/ACT AERS respimat Take 1 puff by mouth every 6 (six) hours as needed for wheezing or shortness of breath.   0  . divalproex (DEPAKOTE) 500 MG DR tablet TAKE 1 TABLET BY MOUTH 2 TIMES DAILY. 60 tablet 0  . DULoxetine (CYMBALTA) 30 MG capsule Take 30 mg by mouth daily.   1  .  Fluticasone-Salmeterol (ADVAIR) 250-50 MCG/DOSE AEPB Inhale 1 puff into the lungs 2 (two) times daily.    . furosemide (LASIX) 40 MG tablet Take 40 mg by mouth daily.  0  . gabapentin (NEURONTIN) 400 MG capsule Take 800 mg by mouth 3 (three) times daily.    . metFORMIN (GLUCOPHAGE) 1000 MG tablet Take 1 tablet (1,000 mg total) by mouth 2 (two) times daily. 60 tablet 6  . metoprolol tartrate (LOPRESSOR) 50 MG tablet Take 50 mg by mouth 2 (two) times daily.  0  . omeprazole (PRILOSEC) 40 MG capsule Take 1 capsule (40 mg total) by mouth 2 (two) times daily. 60 capsule 6  . oxycodone (ROXICODONE) 30 MG immediate release tablet Take 30 mg by mouth 5 (five) times daily as needed for pain.    . ranitidine (ZANTAC) 150 MG tablet Take 150 mg by mouth 2 (two) times daily.   0  . sitaGLIPtin (JANUVIA) 100 MG tablet Take 100 mg by mouth daily.    Marland Kitchen testosterone cypionate (DEPOTESTOSTERONE CYPIONATE) 200 MG/ML injection Inject 200 mg into the muscle every 30 (thirty) days.   0  . tiZANidine (ZANAFLEX) 4 MG tablet Take 4 mg by mouth 3 (three) times daily as needed for muscle spasms.   2  . traZODone (DESYREL) 50 MG tablet Take 1 tablet (50 mg total) by mouth at bedtime. For sleep (Patient taking differently: Take 150 mg by mouth at bedtime. For sleep) 30 tablet 6  . ursodiol (ACTIGALL) 300 MG capsule Take 300 mg by mouth 2 (two) times daily.    . valsartan (DIOVAN) 160 MG tablet Take 160 mg by mouth daily.  1    Musculoskeletal: Strength & Muscle Tone: within normal limits Gait & Station: normal Patient leans: N/A  Psychiatric Specialty Exam: Physical Exam  Constitutional: He is oriented to person, place, and time. He appears well-developed and well-nourished.  Respiratory: Effort normal.  Musculoskeletal: Normal range of motion.  Neurological: He is alert and oriented to person, place, and time.    Review of Systems  Psychiatric/Behavioral: Positive for depression, substance abuse and suicidal ideas.  Negative for hallucinations  and memory loss. The patient is not nervous/anxious and does not have insomnia.     Blood pressure 126/89, pulse 63, temperature (!) 97.5 F (36.4 C), temperature source Oral, resp. rate 18, SpO2 99 %.There is no height or weight on file to calculate BMI.  General Appearance: Disheveled  Eye Contact:  Good  Speech:  Clear and Coherent and Pressured  Volume:  Normal  Mood:  Anxious  Affect:  Congruent and Labile  Thought Process:  Coherent, Goal Directed and Linear  Orientation:  Full (Time, Place, and Person)  Thought Content:  Logical  Suicidal Thoughts:  Yes.  with intent/plan  Homicidal Thoughts:  No  Memory:  Immediate;   Good Recent;   Good Remote;   Fair  Judgement:  Poor  Insight:  Lacking  Psychomotor Activity:  Normal and Increased  Concentration:  Concentration: Good and Attention Span: Good  Recall:  Good  Fund of Knowledge:  Good  Language:  Good  Akathisia:  No  Handed:  Right  AIMS (if indicated):     Assets:  Communication Skills Desire for Improvement Financial Resources/Insurance Housing Resilience Social Support  ADL's:  Intact  Cognition:  WNL  Sleep:        Treatment Plan Summary: Plan Discharge to [facility]  Disposition: Discharge to [facility] for crisis stabilization and medication management  Laveda Abbe, NP 10/18/2016 2:43 PM

## 2016-10-18 NOTE — Progress Notes (Signed)
Per Gearldine Bienenstock with [facility], pt accepted for placement. Accepting is Dr. Roselyn Reef, MD. Call to report [phone]. Bed is available after 7am on 10/18/16. RN notified of acceptance.  Princess Bruins, MSW, LCSWA TTS Specialist [phone]

## 2016-10-18 NOTE — ED Notes (Signed)
Pt family called regarding to pt whereabouts and family was notified that pt was no longer in the [facility] and that writer could not divulge his placement.

## 2016-10-18 NOTE — ED Notes (Signed)
Report called to [facility] Younger RN.

## 2016-10-26 IMAGING — US US ABDOMEN LIMITED
1 series · 14 of 25 positions shown · non-contrast
Comparison: CT of abdomen and pelvis dated 10/14/2015. Abdominal
sonogram dated 05/17/2015.

CLINICAL DATA: 39 y/o  M; abdominal pain and history of gallstone.

EXAM:
US ABDOMEN LIMITED - RIGHT UPPER QUADRANT

[Series 1: us abdomen limited · 0.26mm/px · 87 acquisitions, 14 frames shown]
[im 1/87]
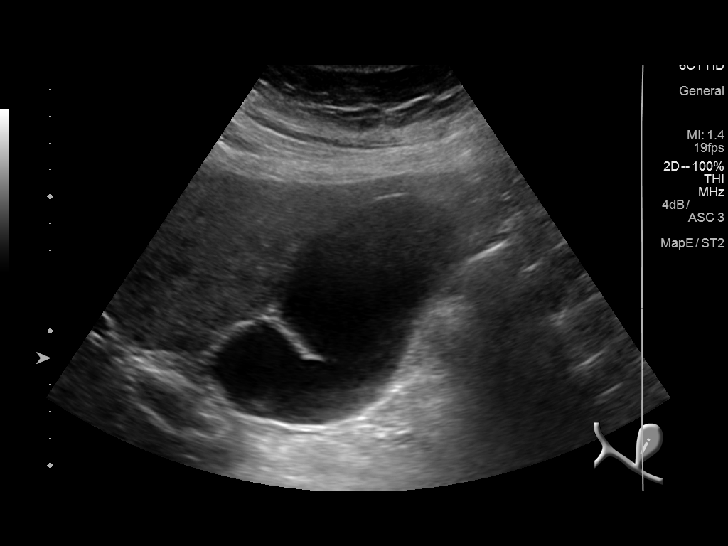
[im 8/87]
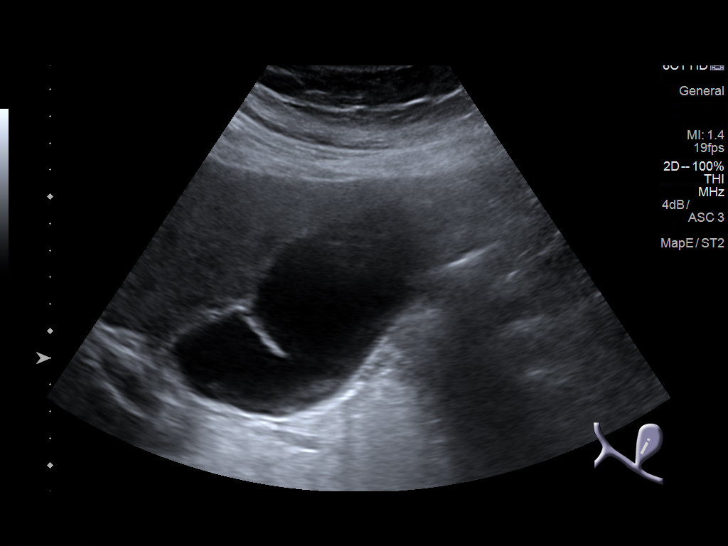
[im 15/87]
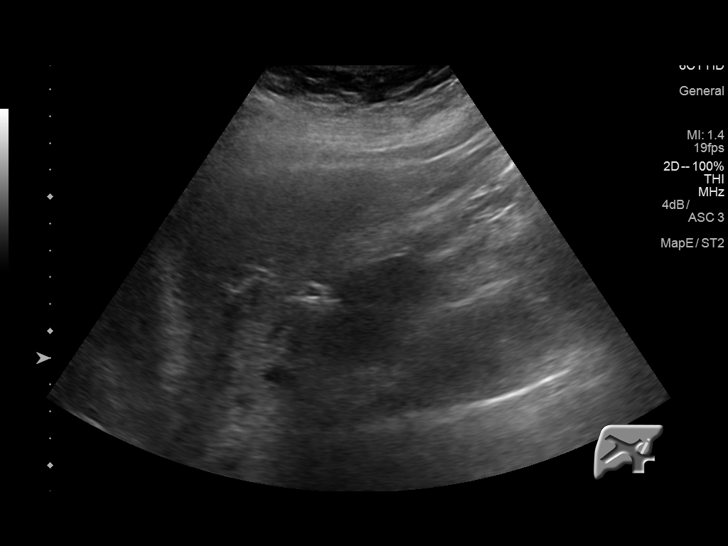
[im 22/87]
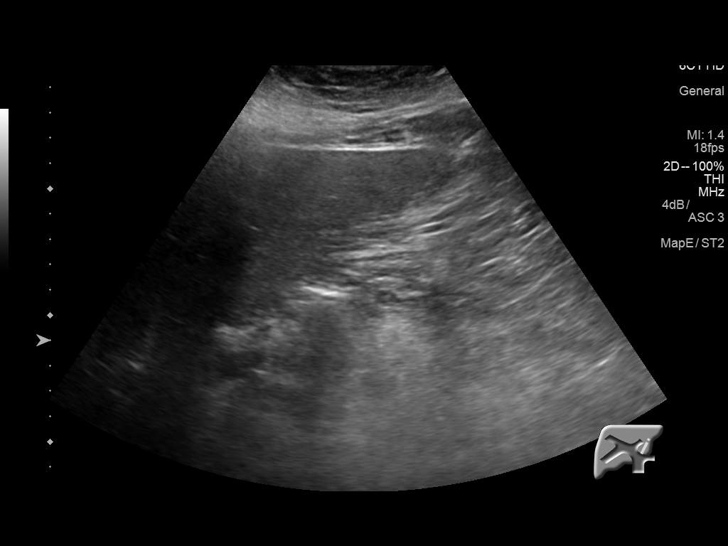
[im 29/87]
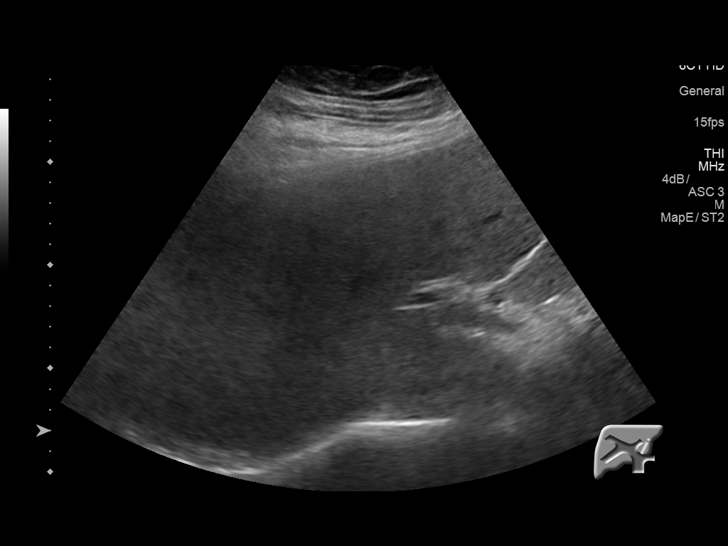
[im 33/87]
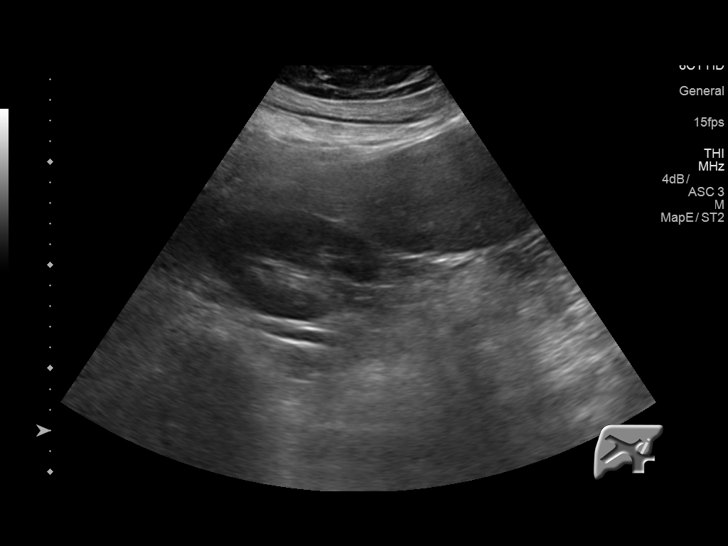
[im 40/87]
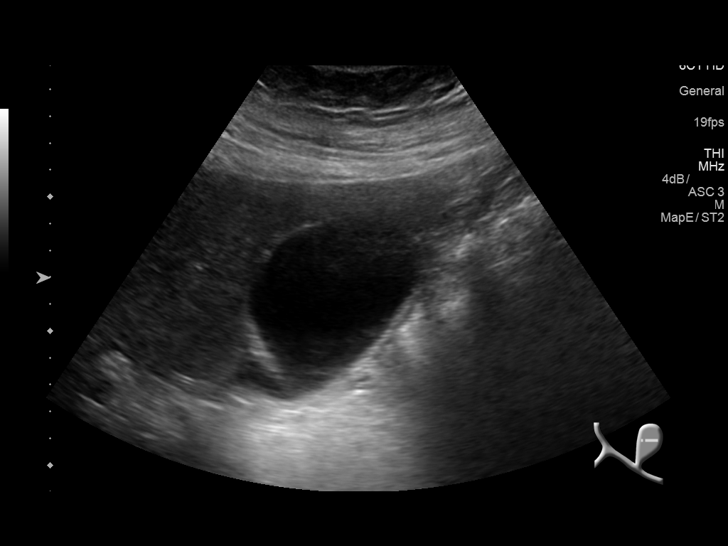
[im 47/87]
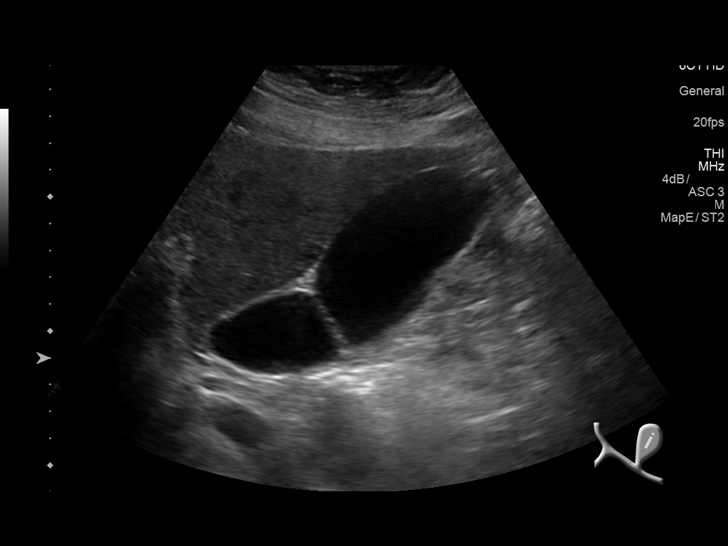
[im 54/87]
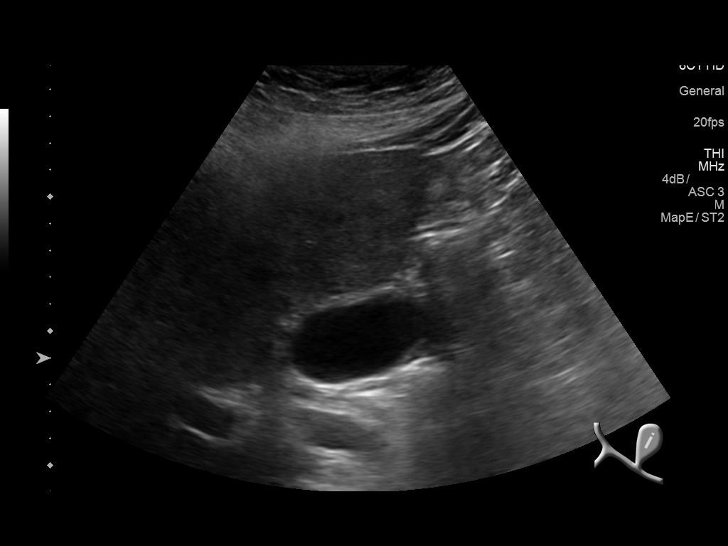
[im 58/87]
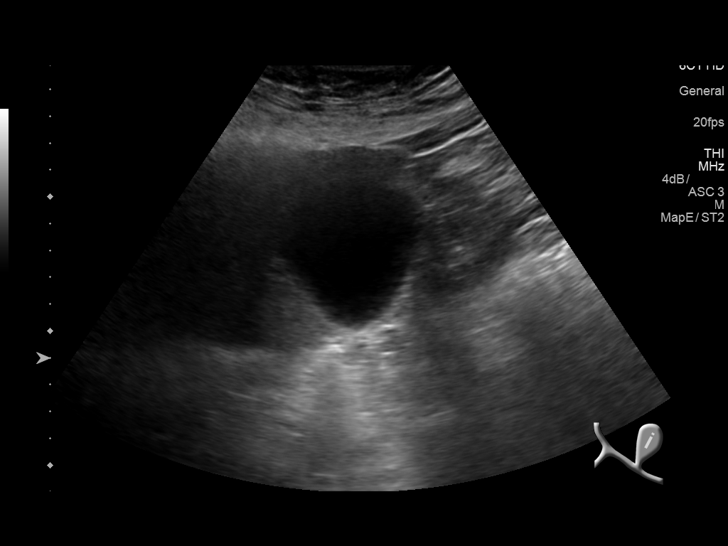
[im 65/87]
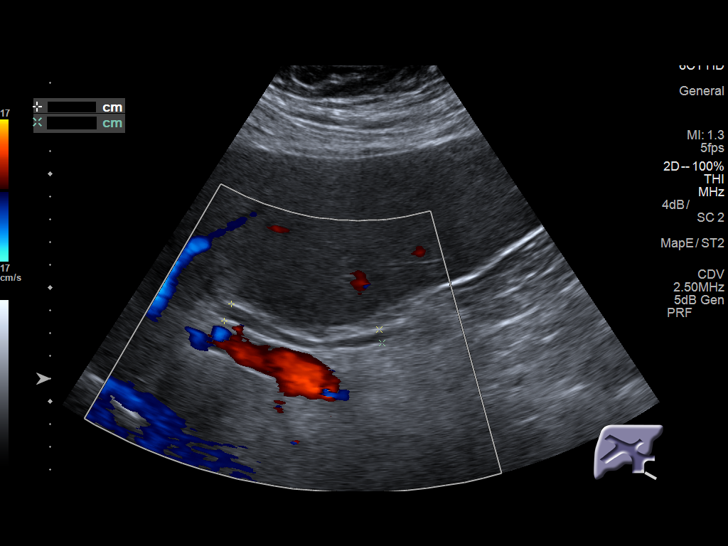
[im 72/87]
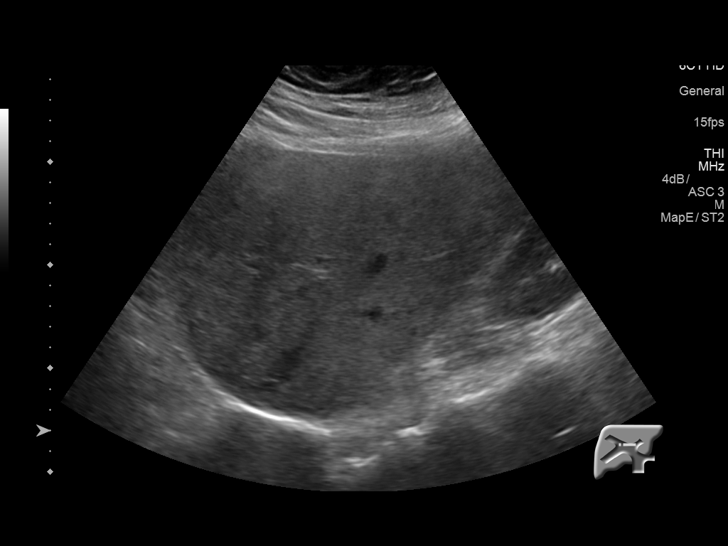
[im 79/87]
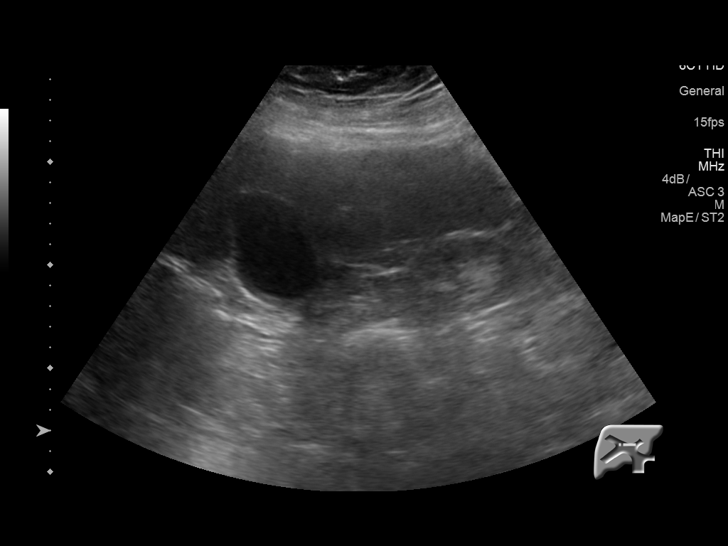
[im 87/87]
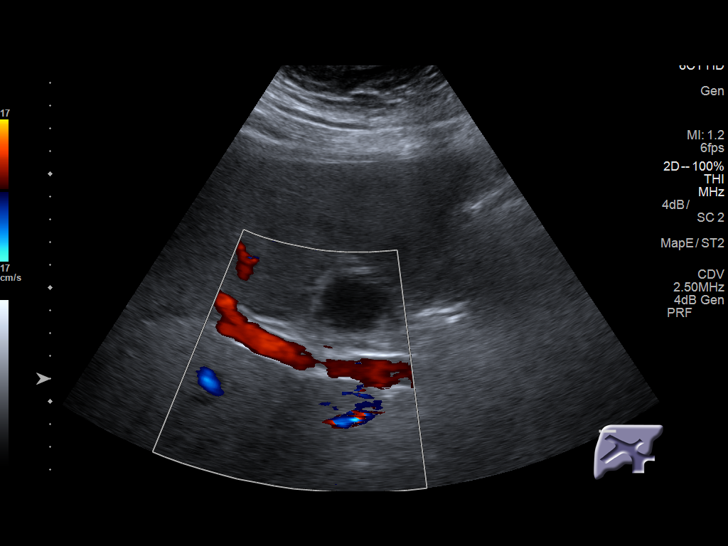

[14 of 25 positions shown; findings below may reference images not displayed]

FINDINGS: Gallbladder:

Single 1 cm calculus without significant interval change from prior
ultrasound. No gallbladder wall thickening, pericholecystic fluid,
or gallbladder enlargement. Negative sonographic Murphy's sign.

Common bile duct:

Diameter: 8 mm proximally, 6 mm distally, within normal limits.

Liver:

No focal lesion identified. Within normal limits in parenchymal
echogenicity.
IMPRESSION: Single unchanged gallstone.  No signs of acute cholecystitis.

By: Sharae Posner M.D.

## 2016-11-11 ENCOUNTER — Inpatient Hospital Stay (HOSPITAL_COMMUNITY)
Admission: EM | Admit: 2016-11-11 | Discharge: 2016-11-11 | DRG: 315 | Discharge status: Against Medical Advice | Payer: Medicare Other | Attending: Internal Medicine | Admitting: Internal Medicine

## 2016-11-11 ENCOUNTER — Emergency Department (HOSPITAL_COMMUNITY): Payer: Medicare Other

## 2016-11-11 DIAGNOSIS — M545 Low back pain: Secondary | ICD-10-CM | POA: Diagnosis present

## 2016-11-11 DIAGNOSIS — Z841 Family history of disorders of kidney and ureter: Secondary | ICD-10-CM | POA: Diagnosis not present

## 2016-11-11 DIAGNOSIS — I952 Hypotension due to drugs: Secondary | ICD-10-CM | POA: Diagnosis not present

## 2016-11-11 DIAGNOSIS — G4733 Obstructive sleep apnea (adult) (pediatric): Secondary | ICD-10-CM | POA: Diagnosis present

## 2016-11-11 DIAGNOSIS — F319 Bipolar disorder, unspecified: Secondary | ICD-10-CM | POA: Diagnosis present

## 2016-11-11 DIAGNOSIS — R0789 Other chest pain: Secondary | ICD-10-CM | POA: Diagnosis present

## 2016-11-11 DIAGNOSIS — Z7982 Long term (current) use of aspirin: Secondary | ICD-10-CM

## 2016-11-11 DIAGNOSIS — J45909 Unspecified asthma, uncomplicated: Secondary | ICD-10-CM | POA: Diagnosis present

## 2016-11-11 DIAGNOSIS — Z915 Personal history of self-harm: Secondary | ICD-10-CM

## 2016-11-11 DIAGNOSIS — Z8249 Family history of ischemic heart disease and other diseases of the circulatory system: Secondary | ICD-10-CM | POA: Diagnosis not present

## 2016-11-11 DIAGNOSIS — F1910 Other psychoactive substance abuse, uncomplicated: Secondary | ICD-10-CM | POA: Diagnosis present

## 2016-11-11 DIAGNOSIS — R55 Syncope and collapse: Secondary | ICD-10-CM | POA: Diagnosis present

## 2016-11-11 DIAGNOSIS — I959 Hypotension, unspecified: Secondary | ICD-10-CM | POA: Diagnosis present

## 2016-11-11 DIAGNOSIS — Z833 Family history of diabetes mellitus: Secondary | ICD-10-CM

## 2016-11-11 DIAGNOSIS — Z7984 Long term (current) use of oral hypoglycemic drugs: Secondary | ICD-10-CM | POA: Diagnosis not present

## 2016-11-11 DIAGNOSIS — Z79899 Other long term (current) drug therapy: Secondary | ICD-10-CM | POA: Diagnosis not present

## 2016-11-11 DIAGNOSIS — F17210 Nicotine dependence, cigarettes, uncomplicated: Secondary | ICD-10-CM | POA: Diagnosis present

## 2016-11-11 DIAGNOSIS — Z8349 Family history of other endocrine, nutritional and metabolic diseases: Secondary | ICD-10-CM

## 2016-11-11 DIAGNOSIS — I10 Essential (primary) hypertension: Secondary | ICD-10-CM | POA: Diagnosis present

## 2016-11-11 DIAGNOSIS — N179 Acute kidney failure, unspecified: Secondary | ICD-10-CM | POA: Diagnosis present

## 2016-11-11 DIAGNOSIS — E785 Hyperlipidemia, unspecified: Secondary | ICD-10-CM | POA: Diagnosis present

## 2016-11-11 DIAGNOSIS — Z823 Family history of stroke: Secondary | ICD-10-CM

## 2016-11-11 DIAGNOSIS — E1165 Type 2 diabetes mellitus with hyperglycemia: Secondary | ICD-10-CM | POA: Diagnosis present

## 2016-11-11 LAB — COMPREHENSIVE METABOLIC PANEL
ALT: 51 U/L (ref 17–63)
ANION GAP: 5 (ref 5–15)
AST: 44 U/L — ABNORMAL HIGH (ref 15–41)
Albumin: 3.4 g/dL — ABNORMAL LOW (ref 3.5–5.0)
Alkaline Phosphatase: 54 U/L (ref 38–126)
BUN: 18 mg/dL (ref 6–20)
CHLORIDE: 104 mmol/L (ref 101–111)
CO2: 29 mmol/L (ref 22–32)
Calcium: 8.8 mg/dL — ABNORMAL LOW (ref 8.9–10.3)
Creatinine, Ser: 1.35 mg/dL — ABNORMAL HIGH (ref 0.61–1.24)
Glucose, Bld: 139 mg/dL — ABNORMAL HIGH (ref 65–99)
POTASSIUM: 3.9 mmol/L (ref 3.5–5.1)
SODIUM: 138 mmol/L (ref 135–145)
Total Bilirubin: 0.5 mg/dL (ref 0.3–1.2)
Total Protein: 7.2 g/dL (ref 6.5–8.1)

## 2016-11-11 LAB — CBC WITH DIFFERENTIAL/PLATELET
Basophils Absolute: 0 10*3/uL (ref 0.0–0.1)
Basophils Relative: 0 %
EOS ABS: 0.3 10*3/uL (ref 0.0–0.7)
EOS PCT: 2 %
HCT: 46.6 % (ref 39.0–52.0)
Hemoglobin: 15.2 g/dL (ref 13.0–17.0)
LYMPHS ABS: 2.1 10*3/uL (ref 0.7–4.0)
Lymphocytes Relative: 18 %
MCH: 29.7 pg (ref 26.0–34.0)
MCHC: 32.6 g/dL (ref 30.0–36.0)
MCV: 91.2 fL (ref 78.0–100.0)
Monocytes Absolute: 0.3 10*3/uL (ref 0.1–1.0)
Monocytes Relative: 3 %
Neutro Abs: 9 10*3/uL — ABNORMAL HIGH (ref 1.7–7.7)
Neutrophils Relative %: 77 %
PLATELETS: 237 10*3/uL (ref 150–400)
RBC: 5.11 MIL/uL (ref 4.22–5.81)
RDW: 14.5 % (ref 11.5–15.5)
WBC: 11.7 10*3/uL — AB (ref 4.0–10.5)

## 2016-11-11 LAB — D-DIMER, QUANTITATIVE (NOT AT ARMC): D DIMER QUANT: 0.39 ug{FEU}/mL (ref 0.00–0.50)

## 2016-11-11 LAB — ETHANOL

## 2016-11-11 LAB — URINALYSIS, ROUTINE W REFLEX MICROSCOPIC
BILIRUBIN URINE: NEGATIVE
GLUCOSE, UA: NEGATIVE mg/dL
HGB URINE DIPSTICK: NEGATIVE
Ketones, ur: NEGATIVE mg/dL
Leukocytes, UA: NEGATIVE
Nitrite: NEGATIVE
PROTEIN: NEGATIVE mg/dL
Specific Gravity, Urine: 1.023 (ref 1.005–1.030)
pH: 5 (ref 5.0–8.0)

## 2016-11-11 LAB — RAPID URINE DRUG SCREEN, HOSP PERFORMED
Amphetamines: NOT DETECTED
BARBITURATES: NOT DETECTED
Benzodiazepines: NOT DETECTED
Cocaine: POSITIVE — AB
Opiates: POSITIVE — AB
Tetrahydrocannabinol: NOT DETECTED

## 2016-11-11 LAB — PROTIME-INR
INR: 1.01
Prothrombin Time: 13.2 seconds (ref 11.4–15.2)

## 2016-11-11 LAB — I-STAT TROPONIN, ED: Troponin i, poc: 0 ng/mL (ref 0.00–0.08)

## 2016-11-11 LAB — ACETAMINOPHEN LEVEL: Acetaminophen (Tylenol), Serum: <10 ug/mL — ABNORMAL LOW (ref 10–30)

## 2016-11-11 LAB — SALICYLATE LEVEL

## 2016-11-11 LAB — I-STAT CG4 LACTIC ACID, ED: LACTIC ACID, VENOUS: 1.85 mmol/L (ref 0.5–1.9)

## 2016-11-11 MED ORDER — SODIUM CHLORIDE 0.9 % IV BOLUS (SEPSIS)
1000.0000 mL | Freq: Once | INTRAVENOUS | Status: AC
  Administered 2016-11-11: 1000 mL via INTRAVENOUS

## 2016-11-11 MED ORDER — VANCOMYCIN HCL 10 G IV SOLR: 1250.0000 mg | Freq: Two times a day (BID) | INTRAVENOUS | Status: DC

## 2016-11-11 MED ORDER — DEXTROSE 5 % IV SOLN: 1.0000 g | INTRAVENOUS | Status: DC

## 2016-11-11 MED ORDER — NOREPINEPHRINE 4 MG/250ML-% IV SOLN
0.0000 ug/min | Freq: Once | INTRAVENOUS | Status: AC
  Administered 2016-11-11: 2 ug/min via INTRAVENOUS
  Filled 2016-11-11: qty 250

## 2016-11-11 MED ORDER — INSULIN ASPART 100 UNIT/ML ~~LOC~~ SOLN: 0.0000 [IU] | SUBCUTANEOUS | Status: DC

## 2016-11-11 MED ORDER — VANCOMYCIN HCL 10 G IV SOLR
2500.0000 mg | Freq: Once | INTRAVENOUS | Status: AC
  Administered 2016-11-11: 2500 mg via INTRAVENOUS
  Filled 2016-11-11: qty 2500

## 2016-11-11 MED ORDER — HEPARIN SODIUM (PORCINE) 5000 UNIT/ML IJ SOLN: 5000.0000 [IU] | Freq: Three times a day (TID) | INTRAMUSCULAR | Status: DC

## 2016-11-11 MED ORDER — VANCOMYCIN HCL 10 G IV SOLR
1500.0000 mg | Freq: Once | INTRAVENOUS | Status: DC
  Filled 2016-11-11: qty 1500

## 2016-11-11 MED ORDER — LACTATED RINGERS IV SOLN
INTRAVENOUS | Status: DC
  Administered 2016-11-11: 16:00:00 via INTRAVENOUS

## 2016-11-11 MED ORDER — IOPAMIDOL (ISOVUE-370) INJECTION 76%
INTRAVENOUS | Status: AC
  Administered 2016-11-11: 100 mL via INTRAVENOUS
  Filled 2016-11-11: qty 100

## 2016-11-11 MED ORDER — LACTATED RINGERS IV BOLUS (SEPSIS)
1000.0000 mL | Freq: Once | INTRAVENOUS | Status: AC
  Administered 2016-11-11: 1000 mL via INTRAVENOUS

## 2016-11-11 MED ORDER — DEXTROSE 5 % IV SOLN: 2.0000 g | INTRAVENOUS | Status: DC

## 2016-11-11 NOTE — ED Notes (Addendum)
Patient arrived at nurses desk fully dressed with no IV or monitor attachments. Patient states "I'm not going to wait here any longer. I think I just took 2 clonidine and ill be fine." Rn attempted to have patient wait for CCM to see patient- patient refused. Pt signed AMA and allowed for RN to place dressing at IV site. Pt alert and oriented, steady gait, denies dizziness, or pain at this time. Pt sts his mom was going to pick him up and he had to go.   CCM notified.

## 2016-11-11 NOTE — ED Notes (Signed)
Patient transported to CT 

## 2016-11-11 NOTE — ED Notes (Signed)
Urine output: 100 ml

## 2016-11-11 NOTE — ED Notes (Addendum)
Pt BP 77/35, cuff readjusted and appropriately placed. 1L NS hanging. Dr. Corlis LeakMacKuen made aware. Pt AOx4, radial pulses palpable bilat.  Verbal order received for 2nd bolus, boluses placed on pressure bags.

## 2016-11-11 NOTE — ED Provider Notes (Signed)
[facility]-EMERGENCY DEPT Provider Note   CSN: [id number] Arrival date & time: 11/11/16  4098     History   Chief Complaint Chief Complaint  Patient presents with  . Chest Pain    HPI Bryan Gallegos is a 41 y.o. male.  HPI   Patient is a 41 year old male presenting status post CPR. Patient was in traffic court today and passed out. Bystanders report that he continued to breathe but that they lost pulses and so they started CPR. They report that he got better, was seated when EMS arrived. EMS found  mildly low blood pressures, A/O 3.  Patient reports mild chest discomfort.  He reports his wife died 2 months ago from "blood infection". He has history of hypertension hyperlipidemia and diabetes as well as obesity. Patient does not have a cardiologist. No cardiac history.  In reading notes patient also has history of psychiatric issues and drug abuse.  He denies any ingestion today. Past Medical History:  Diagnosis Date  . Adult ADHD   . Anaphylactic reaction   . Anxiety   . Asthma   . Bipolar disorder (HCC)   . Complication of anesthesia    Per patient difficult intubation;  . Diabetes mellitus   . Difficult intubation    Per patient  . Drug overdose   . Heart valve disorder    s/p echocardiogram  . Hyperlipidemia   . Hypertension   . Morbidly obese (HCC)   . OSA (obstructive sleep apnea)   . Polysubstance abuse   . Transient cerebral ischemia    Unknown    Patient Active Problem List   Diagnosis Date Noted  . Hypotension 11/11/2016  . Suicide attempt (HCC) 10/17/2016  . Acute and chronic respiratory failure with hypercapnia (HCC)   . Pulmonary edema   . Syncope   . Respiratory failure (HCC) 01/04/2016  . Drug overdose   . Acute encephalopathy 01/01/2016  . Hyponatremia 01/01/2016  . Chronic pain syndrome 10/22/2015  . Healthcare maintenance 10/22/2015  . Hoarse voice quality 10/11/2015  . Major depressive disorder, recurrent severe without psychotic  features (HCC) 09/04/2015  . Polysubstance dependence including opioid type drug, episodic abuse (HCC) 09/04/2015  . RUQ abdominal pain   . Cholelithiasis 05/17/2015  . Antisocial personality disorder 04/11/2015  . Opioid use disorder, severe, dependence (HCC) 04/11/2015  . Benzodiazepine dependence (HCC) 04/11/2015  . Tobacco use disorder 04/10/2015  . Narcotic dependency, continuous (HCC) 03/14/2014  . Chronic back pain greater than 3 months duration 01/28/2014  . Gastroesophageal reflux disease with esophagitis 01/28/2014  . Essential hypertension, benign 01/28/2014  . Diabetes mellitus type II, controlled (HCC) 01/28/2014  . Morbid obesity (HCC) 01/28/2014  . TIA (transient ischemic attack) 09/02/2013  . HLD (hyperlipidemia) 08/06/2013  . OSA on CPAP 08/06/2013    Past Surgical History:  Procedure Laterality Date  . CARPAL TUNNEL RELEASE    . ESOPHAGOGASTRODUODENOSCOPY N/A 04/05/2015   Procedure: ESOPHAGOGASTRODUODENOSCOPY (EGD);  Surgeon: Malissa Hippo, MD;  Location: [facility];  Service: Endoscopy;  Laterality: N/A;  . ESOPHAGOGASTRODUODENOSCOPY (EGD) WITH PROPOFOL N/A 05/21/2015   Procedure: ESOPHAGOGASTRODUODENOSCOPY (EGD) WITH PROPOFOL;  Surgeon: Meryl Dare, MD;  Location: [facility];  Service: Endoscopy;  Laterality: N/A;  . NOSE SURGERY    . TOOTH EXTRACTION    . WISDOM TOOTH EXTRACTION         Home Medications    Prior to Admission medications   Medication Sig Start Date End Date Taking? Authorizing Provider  albuterol (PROVENTIL HFA;VENTOLIN  HFA) 108 (90 Base) MCG/ACT inhaler Inhale 1-2 puffs into the lungs every 6 (six) hours as needed for wheezing or shortness of breath. 11/20/15  Yes Neva Seat, Tiffany, PA-C  alprazolam Prudy Feeler) 2 MG tablet Take 2 mg by mouth 3 (three) times daily.   Yes [provider]  amphetamine-dextroamphetamine (ADDERALL) 30 MG tablet Take 30-60 mg by mouth 2 (two) times daily. Pt takes two tablets in the morning and one in  the evening.   Yes [provider]  aspirin 325 MG tablet Take 1 tablet (325 mg total) by mouth daily. For heart health 09/05/15  Yes Armandina Stammer I, NP  atorvastatin (LIPITOR) 20 MG tablet Take 20 mg by mouth at bedtime.   Yes [provider]  citalopram (CELEXA) 20 MG tablet Take 1 tablet (20 mg total) by mouth every morning. 10/15/15  Yes Storm Frisk, MD  cloNIDine (CATAPRES) 0.2 MG tablet Take 0.2 mg by mouth 2 (two) times daily.   Yes [provider]  COMBIVENT RESPIMAT 20-100 MCG/ACT AERS respimat Take 1 puff by mouth every 6 (six) hours as needed for wheezing or shortness of breath.    Yes [provider]  divalproex (DEPAKOTE) 500 MG DR tablet TAKE 1 TABLET BY MOUTH 2 TIMES DAILY. 03/03/16  Yes Jegede, Phylliss Blakes, MD  DULoxetine (CYMBALTA) 60 MG capsule Take 60 mg by mouth daily. 10/25/16  Yes [provider]  Fluticasone-Salmeterol (ADVAIR) 250-50 MCG/DOSE AEPB Inhale 1 puff into the lungs 2 (two) times daily.   Yes [provider]  furosemide (LASIX) 40 MG tablet Take 40 mg by mouth daily.   Yes [provider]  gabapentin (NEURONTIN) 400 MG capsule Take 800 mg by mouth 3 (three) times daily.   Yes [provider]  metFORMIN (GLUCOPHAGE) 1000 MG tablet Take 1 tablet (1,000 mg total) by mouth 2 (two) times daily. 10/15/15  Yes Storm Frisk, MD  metoprolol tartrate (LOPRESSOR) 50 MG tablet Take 50 mg by mouth 2 (two) times daily.   Yes [provider]  omeprazole (PRILOSEC) 40 MG capsule Take 1 capsule (40 mg total) by mouth 2 (two) times daily. 10/15/15  Yes Storm Frisk, MD  oxycodone (ROXICODONE) 30 MG immediate release tablet Take 30 mg by mouth 5 (five) times daily as needed for pain.   Yes [provider]  QUEtiapine (SEROQUEL) 100 MG tablet Take 100-300 mg by mouth 3 (three) times daily. Take 1 tablet in the morning, take 3 tablets in the evening, and take 2 tablets at bedtime 10/25/16   Yes [provider]  ranitidine (ZANTAC) 150 MG tablet Take 150 mg by mouth 2 (two) times daily.    Yes [provider]  testosterone cypionate (DEPOTESTOSTERONE CYPIONATE) 200 MG/ML injection Inject 200 mg into the muscle every 30 (thirty) days.    Yes [provider]  tiZANidine (ZANAFLEX) 4 MG tablet Take 4 mg by mouth 3 (three) times daily as needed for muscle spasms.    Yes [provider]  traZODone (DESYREL) 50 MG tablet Take 1 tablet (50 mg total) by mouth at bedtime. For sleep Patient taking differently: Take 150 mg by mouth at bedtime. For sleep 10/15/15  Yes Storm Frisk, MD  ursodiol (ACTIGALL) 300 MG capsule Take 300 mg by mouth 2 (two) times daily.   Yes [provider]  valsartan (DIOVAN) 160 MG tablet Take 160 mg by mouth daily.   Yes [provider]  DULoxetine (CYMBALTA) 30 MG capsule Take  60 mg by mouth daily.     [provider]  sitaGLIPtin (JANUVIA) 100 MG tablet Take 100 mg by mouth daily.    [provider]    Family History Family History  Problem Relation Age of Onset  . CAD Mother        Living  . Diabetes Mellitus II Mother   . Stroke Mother   . Hypertension Mother   . Congestive Heart Failure Mother   . Kidney disease Mother   . Fibromyalgia Mother   . Thyroid disease Mother   . Hyperlipidemia Mother   . Liver disease Mother   . Alcoholism Father 1535       Deceased  . Arthritis Maternal Grandmother   . Congestive Heart Failure Maternal Grandmother   . Hypertension Maternal Grandmother   . Lung cancer Maternal Grandfather   . Colon cancer Maternal Aunt   . Stomach cancer Maternal Aunt   . Heart disease Other        Paternal & Maternal  . Crohn's disease Other   . Hypertension Other        Paternal & Maternal  . Hypertension Brother        x3  . Hypertension Sister        #1  . Bipolar disorder Sister        #1  . ADD / ADHD Son        x3  . Bipolar disorder Son         x3  . Asperger's syndrome Son     Social History Social History  Substance Use Topics  . Smoking status: Current Every Day Smoker    Packs/day: 0.50    Years: 23.00    Types: Cigarettes  . Smokeless tobacco: Never Used  . Alcohol use No     Allergies   Ace inhibitors; Atenolol; Lisinopril; Prednisone; Ibuprofen; and Tylenol [acetaminophen]   Review of Systems Review of Systems  Constitutional: Negative for activity change, fatigue and fever.  Respiratory: Negative for shortness of breath.   Cardiovascular: Positive for chest pain.  Gastrointestinal: Negative for abdominal pain.  Neurological: Negative for dizziness, weakness, numbness and headaches.  All other systems reviewed and are negative.    Physical Exam Updated Vital Signs BP (!) 85/39   Pulse 66   Temp 98.4 F (36.9 C) (Oral)   Resp 13   SpO2 97%   Physical Exam  Constitutional: He is oriented to person, place, and time. He appears well-nourished.  HENT:  Head: Normocephalic.  Eyes: Conjunctivae are normal.  Cardiovascular: Normal rate and regular rhythm.   No murmur heard. Pulmonary/Chest: Effort normal and breath sounds normal. No respiratory distress. He has no wheezes.  Abdominal: Soft. He exhibits no distension.  Obese abdomen  Neurological: He is oriented to person, place, and time.  Skin: Skin is warm and dry. He is not diaphoretic.  Lots old cuts, excoriations and scabbing.  Psychiatric: His behavior is normal.     ED Treatments / Results  Labs (all labs ordered are listed, but only abnormal results are displayed) Labs Reviewed  CBC WITH DIFFERENTIAL/PLATELET - Abnormal; Notable for the following:       Result Value   WBC 11.7 (*)    Neutro Abs 9.0 (*)    All other components within normal limits  COMPREHENSIVE METABOLIC PANEL - Abnormal; Notable for the following:    Glucose, Bld 139 (*)    Creatinine, Ser 1.35 (*)    Calcium 8.8 (*)  Albumin 3.4 (*)    AST 44 (*)    All  other components within normal limits  ACETAMINOPHEN LEVEL - Abnormal; Notable for the following:    Acetaminophen (Tylenol), Serum <10 (*)    All other components within normal limits  RAPID URINE DRUG SCREEN, HOSP PERFORMED - Abnormal; Notable for the following:    Opiates POSITIVE (*)    Cocaine POSITIVE (*)    All other components within normal limits  CULTURE, BLOOD (SINGLE)  CULTURE, BLOOD (ROUTINE X 2)  CULTURE, BLOOD (ROUTINE X 2)  PROTIME-INR  URINALYSIS, ROUTINE W REFLEX MICROSCOPIC  ETHANOL  SALICYLATE LEVEL  D-DIMER, QUANTITATIVE (NOT AT [facility])  PROCALCITONIN  HIV ANTIBODY (ROUTINE TESTING)  CREATININE, SERUM  TROPONIN I  TROPONIN I  TROPONIN I  I-STAT TROPONIN, ED  I-STAT CG4 LACTIC ACID, ED    EKG  EKG Interpretation None       Radiology Ct Head Wo Contrast  Result Date: 11/11/2016 CLINICAL DATA:  41 year old male with history of syncopal event today. Occipital headache. EXAM: CT HEAD WITHOUT CONTRAST TECHNIQUE: Contiguous axial images were obtained from the base of the skull through the vertex without intravenous contrast. COMPARISON:  Head CT 01/01/2016. FINDINGS: Brain: No evidence of acute infarction, hemorrhage, hydrocephalus, extra-axial collection or mass lesion/mass effect. Vascular: No hyperdense vessel or unexpected calcification. Skull: Normal. Negative for fracture or focal lesion. Sinuses/Orbits: No acute finding. Other: Small right mastoid effusion. IMPRESSION: 1. No acute intracranial abnormalities. The appearance of the brain is normal. 2. Small right mastoid effusion. Electronically Signed   By: Trudie Reed M.D.   On: 11/11/2016 10:38   Dg Chest Portable 1 View  Result Date: 11/11/2016 CLINICAL DATA:  Post CPR, syncope EXAM: PORTABLE CHEST 1 VIEW COMPARISON:  09/07/2016 FINDINGS: Heart is borderline in size, accentuated by the AP lordotic nature of the study. No confluent airspace opacities or effusions. No acute bony abnormality. IMPRESSION:  No active disease. Electronically Signed   By: Charlett Nose M.D.   On: 11/11/2016 10:34   Ct Angio Chest/abd/pel For Dissection W And/or Wo Contrast  Result Date: 11/11/2016 CLINICAL DATA:  Acute chest pain. EXAM: CT ANGIOGRAPHY CHEST, ABDOMEN AND PELVIS TECHNIQUE: Multidetector CT imaging through the chest, abdomen and pelvis was performed using the standard protocol during bolus administration of intravenous contrast. Multiplanar reconstructed images and MIPs were obtained and reviewed to evaluate the vascular anatomy. CONTRAST:  100 mL Isovue 370 COMPARISON:  06/19/2016, 06/07/2014 FINDINGS: CTA CHEST FINDINGS Cardiovascular: Preferential opacification of the thoracic aorta. No evidence of thoracic aortic aneurysm or dissection. Normal heart size. No pericardial effusion. Small amount air in the pulmonary artery likely iatrogenic from the IV. Mediastinum/Nodes: No enlarged mediastinal, hilar, or axillary lymph nodes. Thyroid gland, trachea, and esophagus demonstrate no significant findings. Lungs/Pleura: Stable 6 mm subpleural right middle lobe pulmonary nodule. Stable 7 mm right lower lobe pulmonary nodule (image 85/series 8). No focal consolidation, pleural effusion or pneumothorax. Musculoskeletal: No chest wall abnormality. No acute or significant osseous findings. Review of the MIP images confirms the above findings. CTA ABDOMEN AND PELVIS FINDINGS VASCULAR Aorta: Normal caliber aorta without aneurysm, dissection, vasculitis or significant stenosis. Celiac: Patent without evidence of aneurysm, dissection, vasculitis or significant stenosis. SMA: Patent without evidence of aneurysm, dissection, vasculitis or significant stenosis. Renals: Both renal arteries are patent without evidence of aneurysm, dissection, vasculitis, fibromuscular dysplasia or significant stenosis. IMA: Patent without evidence of aneurysm, dissection, vasculitis or significant stenosis. Inflow: Patent without evidence of aneurysm,  dissection, vasculitis  or significant stenosis. Veins: No obvious venous abnormality within the limitations of this arterial phase study. Review of the MIP images confirms the above findings. NON-VASCULAR Hepatobiliary: No focal liver abnormality is seen. No gallstones, gallbladder wall thickening, or biliary dilatation. Pancreas: Unremarkable. No pancreatic ductal dilatation or surrounding inflammatory changes. Spleen: Normal in size without focal abnormality. Adrenals/Urinary Tract: Adrenal glands are unremarkable. Kidneys are normal, without renal calculi, focal lesion, or hydronephrosis. Bladder is unremarkable. Stomach/Bowel: Stomach is within normal limits. Appendix appears normal. No evidence of bowel wall thickening, distention, or inflammatory changes. Lymphatic: Portacaval lymphadenopathy with the largest portacaval lymph node measuring 17 mm. No other abdominal or pelvic lymphadenopathy. Reproductive: Prostate is unremarkable. Other: No fluid collection or hematoma. Musculoskeletal: No acute osseous abnormality. No lytic or sclerotic osseous lesion. Mild bilateral facet arthropathy of the lumbar spine. Review of the MIP images confirms the above findings. IMPRESSION: 1. No aortic dissection.  No aortic aneurysm. 2. No active cardiopulmonary disease. 3. No acute abdominal or pelvic pathology. Electronically Signed   By: Elige Ko   On: 11/11/2016 12:10    Procedures Procedures (including critical care time)  CRITICAL CARE Performed by: Arlana Hove Total critical care time: 60 minutes Critical care time was exclusive of separately billable procedures and treating other patients. Critical care was necessary to treat or prevent imminent or life-threatening deterioration. Critical care was time spent personally by me on the following activities: development of treatment plan with patient and/or surrogate as well as nursing, discussions with consultants, evaluation of patient's response to  treatment, examination of patient, obtaining history from patient or surrogate, ordering and performing treatments and interventions, ordering and review of laboratory studies, ordering and review of radiographic studies, pulse oximetry and re-evaluation of patient's condition.   Medications Ordered in ED Medications  cefTRIAXone (ROCEPHIN) 2 g in dextrose 5 % 50 mL IVPB (not administered)  vancomycin (VANCOCIN) 1,250 mg in sodium chloride 0.9 % 250 mL IVPB (not administered)  vancomycin (VANCOCIN) 2,500 mg in sodium chloride 0.9 % 500 mL IVPB (2,500 mg Intravenous New Bag/Given 11/11/16 1502)  insulin aspart (novoLOG) injection 0-15 Units (not administered)  heparin injection 5,000 Units (not administered)  lactated ringers infusion ( Intravenous New Bag/Given 11/11/16 1611)  sodium chloride 0.9 % bolus 1,000 mL (0 mLs Intravenous Stopped 11/11/16 1130)  iopamidol (ISOVUE-370) 76 % injection (100 mLs Intravenous Contrast Given 11/11/16 1136)  sodium chloride 0.9 % bolus 1,000 mL (0 mLs Intravenous Stopped 11/11/16 1130)  sodium chloride 0.9 % bolus 1,000 mL (0 mLs Intravenous Stopped 11/11/16 1223)  norepinephrine (LEVOPHED)  in D5W premix infusion (0 mcg/min Intravenous Paused 11/11/16 1355)  sodium chloride 0.9 % bolus 1,000 mL (0 mLs Intravenous Stopped 11/11/16 1547)  lactated ringers bolus 1,000 mL (0 mLs Intravenous Stopped 11/11/16 1547)     Initial Impression / Assessment and Plan / ED Course  I have reviewed the triage vital signs and the nursing notes.  Pertinent labs & imaging results that were available during my care of the patient were reviewed by me and considered in my medical decision making (see chart for details).      Patient is a 41 year old male presenting status post CPR. Patient was in traffic court today and passed out. Bystanders report that he continued to breathe but that they lost pulses and so they started CPR. They report that he got better, was seated  when EMS arrived. EMS found  mildly low blood pressures, A/O 3.  Patient reports mild  chest discomfort.  He reports his wife died 2 months ago from "blood infection". He has history of hypertension hyperlipidemia and diabetes as well as obesity. Patient does not have a cardiologist. No cardiac history.  In reading notes patient also has history of psychiatric issues and drug abuse.  He denies any ingestion today.  4:11 PM We'll start with broad-spectrum workup. Likely etiologies syncope versus cardiac versus ingestion   11:10 AM Paitent now having chest pain to his back. Also told nursing he has not been around as much as usual, lying on couch because he is distressed. Nursing noted a low BP, will start 2 L on pressure bags and get stat CT dissection study.,  12:27 PM Patient still mentating fine. But with pressures repetitively in the 80s over 40s. Initial workup started negative except history AKI. Patient is not on any chronic steroids. Denies taking any additional blood pressure pills. Unsure whether this is hypovolemia versus some type of medical drug effect.  BP has not improved with fluids. Will start peripheral pressors. Will discuss with crit care, though he really does appear well enough for stepdown.    Final Clinical Impressions(s) / ED Diagnoses   Final diagnoses:  None    New Prescriptions New Prescriptions   No medications on file     Abelino Derrick, MD 11/11/16 1611

## 2016-11-11 NOTE — ED Triage Notes (Signed)
Pt here from court with a syncopal episode , court room staff did chest compressions( they stated that pt was breathing but did not have a pulse ) upon ems arrival pt was awake and c/o chest pain

## 2016-11-11 NOTE — ED Notes (Addendum)
Dr. Tyson AliasFeinstein and Kreg ShropshireP. Babcock, PA, Crit care, at bedside.

## 2016-11-11 NOTE — Consult Note (Signed)
[facility] C[facility] MEDICINE   Name: Bryan Gallegos MRN: [id number] DOB: [birth year]    ADMISSION DATE:  11/11/2016 CONSULTATION DATE:  9/13  REFERRING MD: Corlis Leak  CHIEF COMPLAINT:  shock  HISTORY OF PRESENT ILLNESS:   41 year old obese male w/ sig h/o HTN, HL, DM and IV drug abuse in addition to depression and intentional drug overdoses after the loss of his wife about 2 months prior to his presentation. Was in traffic court on 9/13 when had witnessed syncopal episode. He was breathing however bystanders were not able to palpate a pulse so CPR was started. He regained consciousness w/ bystanders prior to EMS arrival and was seated when EMS arrived. In ER he was found to have persistent hypotension w/ sbp in 70s but no SIRS. He ws started on abx, cultures drawn, lactic acid was < 2. PCCM was asked to see him because in spite of 4 liters he remained hypotensive. We administered 5th liter. His MAP was 55 but he was fully awake and in no distress.  PAST MEDICAL HISTORY :  He  has a past medical history of Adult ADHD; Anaphylactic reaction; Anxiety; Asthma; Bipolar disorder (HCC); Complication of anesthesia; Diabetes mellitus; Difficult intubation; Drug overdose; Heart valve disorder; Hyperlipidemia; Hypertension; Morbidly obese (HCC); OSA (obstructive sleep apnea); Polysubstance abuse; and Transient cerebral ischemia.  PAST SURGICAL HISTORY: He  has a past surgical history that includes Carpal tunnel release; Nose surgery; Wisdom tooth extraction; Tooth extraction; Esophagogastroduodenoscopy (N/A, 04/05/2015); and Esophagogastroduodenoscopy (egd) with propofol (N/A, 05/21/2015).  Allergies  Allergen Reactions  . Ace Inhibitors Anaphylaxis  . Atenolol Anaphylaxis  . Lisinopril Anaphylaxis  . Prednisone Hives  . Ibuprofen Itching, Swelling and Other (See Comments)    Hand/feet swelling   . Tylenol [Acetaminophen] Other (See Comments)    Reaction:  GI bleeding     No current  facility-administered medications on file prior to encounter.    Current Outpatient Prescriptions on File Prior to Encounter  Medication Sig  . albuterol (PROVENTIL HFA;VENTOLIN HFA) 108 (90 Base) MCG/ACT inhaler Inhale 1-2 puffs into the lungs every 6 (six) hours as needed for wheezing or shortness of breath.  . alprazolam (XANAX) 2 MG tablet Take 2 mg by mouth 3 (three) times daily.  [facility] amphetamine-dextroamphetamine (ADDERALL) 30 MG tablet Take 30-60 mg by mouth 2 (two) times daily. Pt takes two tablets in the morning and one in the evening.  [facility] aspirin 325 MG tablet Take 1 tablet (325 mg total) by mouth daily. For heart health  . atorvastatin (LIPITOR) 20 MG tablet Take 20 mg by mouth at bedtime.  . citalopram (CELEXA) 20 MG tablet Take 1 tablet (20 mg total) by mouth every morning.  . cloNIDine (CATAPRES) 0.2 MG tablet Take 0.2 mg by mouth 2 (two) times daily.  . COMBIVENT RESPIMAT 20-100 MCG/ACT AERS respimat Take 1 puff by mouth every 6 (six) hours as needed for wheezing or shortness of breath.   . divalproex (DEPAKOTE) 500 MG DR tablet TAKE 1 TABLET BY MOUTH 2 TIMES DAILY.  [facility] Fluticasone-Salmeterol (ADVAIR) 250-50 MCG/DOSE AEPB Inhale 1 puff into the lungs 2 (two) times daily.  . furosemide (LASIX) 40 MG tablet Take 40 mg by mouth daily.  [facility] gabapentin (NEURONTIN) 400 MG capsule Take 800 mg by mouth 3 (three) times daily.  . metFORMIN (GLUCOPHAGE) 1000 MG tablet Take 1 tablet (1,000 mg total) by mouth 2 (two) times daily.  . metoprolol tartrate (LOPRESSOR) 50 MG tablet Take 50 mg by  mouth 2 (two) times daily.  [facility] omeprazole (PRILOSEC) 40 MG capsule Take 1 capsule (40 mg total) by mouth 2 (two) times daily.  [facility] oxycodone (ROXICODONE) 30 MG immediate release tablet Take 30 mg by mouth 5 (five) times daily as needed for pain.  . ranitidine (ZANTAC) 150 MG tablet Take 150 mg by mouth 2 (two) times daily.   [facility] testosterone cypionate (DEPOTESTOSTERONE CYPIONATE) 200 MG/ML injection Inject 200 mg into  the muscle every 30 (thirty) days.   [facility] tiZANidine (ZANAFLEX) 4 MG tablet Take 4 mg by mouth 3 (three) times daily as needed for muscle spasms.   . traZODone (DESYREL) 50 MG tablet Take 1 tablet (50 mg total) by mouth at bedtime. For sleep (Patient taking differently: Take 150 mg by mouth at bedtime. For sleep)  . ursodiol (ACTIGALL) 300 MG capsule Take 300 mg by mouth 2 (two) times daily.  . valsartan (DIOVAN) 160 MG tablet Take 160 mg by mouth daily.  . DULoxetine (CYMBALTA) 30 MG capsule Take 60 mg by mouth daily.   . sitaGLIPtin (JANUVIA) 100 MG tablet Take 100 mg by mouth daily.  . [DISCONTINUED] lisinopril (PRINIVIL,ZESTRIL) 40 MG tablet Take 1 tablet (40 mg total) by mouth 2 (two) times daily. For high blood pressure    FAMILY HISTORY:  His indicated that his mother is alive. He indicated that the status of his father is unknown. He indicated that the status of his brother is unknown. He indicated that the status of his maternal grandmother is unknown. He indicated that the status of his maternal grandfather is unknown.    SOCIAL HISTORY: He  reports that he has been smoking Cigarettes.  He has a 11.50 pack-year smoking history. He has never used smokeless tobacco. He reports that he does not drink alcohol or use drugs.  REVIEW OF SYSTEMS:   Review of Systems:   Bolds are positive  Constitutional: weight loss, gain, night sweats, Fevers, chills, fatigue .  HEENT: headaches, Sore throat, sneezing, nasal congestion, post nasal drip, Difficulty swallowing, Tooth/dental problems, visual complaints visual changes, ear ache CV:  Intermittent chest pain, radiates:to back,Orthopnea, + syncope PND, no swelling in lower extremities dizziness, palpitations, syncope.  GI  heartburn, indigestion, abdominal pain, nausea, vomiting, diarrhea, change in bowel habits, loss of appetite, bloody stools.  Resp: no coug , hemoptysis, dyspnea, pleuritic.  Skin: multiple small ulcerations all over the abd GU:  dysuria, change in color of urine, urgency or frequency. flank pain, hematuria  MS: joint pain or swelling. decreased range of motion  Psych: change in mood or affect. depression or anxiety, recently lost wife, has tried to kill self .  Neuro: difficulty with speech, weakness, numbness, ataxia   SUBJECTIVE:  No distress  VITAL SIGNS: BP (!) 85/60   Pulse 69   Temp 98.4 F (36.9 C) (Oral)   Resp 15   SpO2 94%   HEMODYNAMICS:    VENTILATOR SETTINGS:    INTAKE / OUTPUT: No intake/output data recorded.  PHYSICAL EXAMINATION: General appearance:  41 Year old  Male obese, chronically ill but  NAD,conversant  Eyes: anicteric sclerae , moist conjunctivae; PERRL, EOMI bilaterally. Mouth:  membranes and no mucosal ulcerations; normal hard and soft palate Neck: Trachea midline; neck supple, no JVD Lungs/chest: CTA, with normal respiratory effort and no intercostal retractions CV: RRR, no MRGs  Abdomen: Soft, non-tender; no masses or HSM Extremities: No peripheral edema or extremity lymphadenopathy Skin: Normal temperature, turgor and texture; mutiple ulcers over the abd and legs.  Both hands appear to have track marks.  Psych:alert and oriented to person, place and time, he is evasive to questioning    LABS:  BMET  Recent Labs Lab 11/11/16 1022  NA 138  K 3.9  CL 104  CO2 29  BUN 18  CREATININE 1.35*  GLUCOSE 139*    Electrolytes  Recent Labs Lab 11/11/16 1022  CALCIUM 8.8*    CBC  Recent Labs Lab 11/11/16 1022  WBC 11.7*  HGB 15.2  HCT 46.6  PLT 237    Coag's  Recent Labs Lab 11/11/16 1022  INR 1.01    Sepsis Markers  Recent Labs Lab 11/11/16 1034  LATICACIDVEN 1.85    ABG No results for input(s): PHART, PCO2ART, PO2ART in the last 168 hours.  Liver Enzymes  Recent Labs Lab 11/11/16 1022  AST 44*  ALT 51  ALKPHOS 54  BILITOT 0.5  ALBUMIN 3.4*    Cardiac Enzymes No results for input(s): TROPONINI, PROBNP in the last 168  hours.  Glucose No results for input(s): GLUCAP in the last 168 hours.  Imaging Ct Head Wo Contrast  Result Date: 11/11/2016 CLINICAL DATA:  40 year old male with history of syncopal event today. Occipital headache. EXAM: CT HEAD WITHOUT CONTRAST TECHNIQUE: Contiguous axial images were obtained from the base of the skull through the vertex without intravenous contrast. COMPARISON:  Head CT 01/01/2016. FINDINGS: Brain: No evidence of acute infarction, hemorrhage, hydrocephalus, extra-axial collection or mass lesion/mass effect. Vascular: No hyperdense vessel or unexpected calcification. Skull: Normal. Negative for fracture or focal lesion. Sinuses/Orbits: No acute finding. Other: Small right mastoid effusion. IMPRESSION: 1. No acute intracranial abnormalities. The appearance of the brain is normal. 2. Small right mastoid effusion. Electronically Signed   By: Trudie Reed M.D.   On: 11/11/2016 10:38   Dg Chest Portable 1 View  Result Date: 11/11/2016 CLINICAL DATA:  Post CPR, syncope EXAM: PORTABLE CHEST 1 VIEW COMPARISON:  09/07/2016 FINDINGS: Heart is borderline in size, accentuated by the AP lordotic nature of the study. No confluent airspace opacities or effusions. No acute bony abnormality. IMPRESSION: No active disease. Electronically Signed   By: Charlett Nose M.D.   On: 11/11/2016 10:34   Ct Angio Chest/abd/pel For Dissection W And/or Wo Contrast  Result Date: 11/11/2016 CLINICAL DATA:  Acute chest pain. EXAM: CT ANGIOGRAPHY CHEST, ABDOMEN AND PELVIS TECHNIQUE: Multidetector CT imaging through the chest, abdomen and pelvis was performed using the standard protocol during bolus administration of intravenous contrast. Multiplanar reconstructed images and MIPs were obtained and reviewed to evaluate the vascular anatomy. CONTRAST:  100 mL Isovue 370 COMPARISON:  06/19/2016, 06/07/2014 FINDINGS: CTA CHEST FINDINGS Cardiovascular: Preferential opacification of the thoracic aorta. No evidence of  thoracic aortic aneurysm or dissection. Normal heart size. No pericardial effusion. Small amount air in the pulmonary artery likely iatrogenic from the IV. Mediastinum/Nodes: No enlarged mediastinal, hilar, or axillary lymph nodes. Thyroid gland, trachea, and esophagus demonstrate no significant findings. Lungs/Pleura: Stable 6 mm subpleural right middle lobe pulmonary nodule. Stable 7 mm right lower lobe pulmonary nodule (image 85/series 8). No focal consolidation, pleural effusion or pneumothorax. Musculoskeletal: No chest wall abnormality. No acute or significant osseous findings. Review of the MIP images confirms the above findings. CTA ABDOMEN AND PELVIS FINDINGS VASCULAR Aorta: Normal caliber aorta without aneurysm, dissection, vasculitis or significant stenosis. Celiac: Patent without evidence of aneurysm, dissection, vasculitis or significant stenosis. SMA: Patent without evidence of aneurysm, dissection, vasculitis or significant stenosis. Renals: Both renal arteries are patent without evidence  of aneurysm, dissection, vasculitis, fibromuscular dysplasia or significant stenosis. IMA: Patent without evidence of aneurysm, dissection, vasculitis or significant stenosis. Inflow: Patent without evidence of aneurysm, dissection, vasculitis or significant stenosis. Veins: No obvious venous abnormality within the limitations of this arterial phase study. Review of the MIP images confirms the above findings. NON-VASCULAR Hepatobiliary: No focal liver abnormality is seen. No gallstones, gallbladder wall thickening, or biliary dilatation. Pancreas: Unremarkable. No pancreatic ductal dilatation or surrounding inflammatory changes. Spleen: Normal in size without focal abnormality. Adrenals/Urinary Tract: Adrenal glands are unremarkable. Kidneys are normal, without renal calculi, focal lesion, or hydronephrosis. Bladder is unremarkable. Stomach/Bowel: Stomach is within normal limits. Appendix appears normal. No evidence  of bowel wall thickening, distention, or inflammatory changes. Lymphatic: Portacaval lymphadenopathy with the largest portacaval lymph node measuring 17 mm. No other abdominal or pelvic lymphadenopathy. Reproductive: Prostate is unremarkable. Other: No fluid collection or hematoma. Musculoskeletal: No acute osseous abnormality. No lytic or sclerotic osseous lesion. Mild bilateral facet arthropathy of the lumbar spine. Review of the MIP images confirms the above findings. IMPRESSION: 1. No aortic dissection.  No aortic aneurysm. 2. No active cardiopulmonary disease. 3. No acute abdominal or pelvic pathology. Electronically Signed   By: Elige KoHetal  Patel   On: 11/11/2016 12:10     STUDIES:   CT chest 9/13: 1. No aortic dissection.  No aortic aneurysm.2. No active cardiopulmonary disease.3. No acute abdominal or pelvic pathology. MR t spine 9/13>>> MR L spine 9/13>>>  CULTURES: Blood culture 9/13>>>  ANTIBIOTICS: vanc 9/13>>> Rocephin 9/13>>>  SIGNIFICANT EVENTS:   LINES/TUBES:   DISCUSSION: 41 year old male s/p syncopal episode. Remains hypotensive. He does not appear critically ill however he has remained hypotensive ins spite of 4 liters crystalloid. In the setting of admitted IV drug abuse and new lower back pain endocarditis and/or epidural abscess are a consideration.. Also consider overdose of clonidine (which also certainly could be the case here) He denies this BUT readily admits intentional overdoses in the recent past.  Cont IVFs Have ordered vanc and rocephin Blood cultures/pct algo/MR imaging and ECHO He can be admitted to the SDU setting  ASSESSMENT / PLAN:  Hypotension/shock. Etiology unclear.   Does not meet SIRS parameters. Ddx: sepsis in setting of IV drug abuse--> major concern here would be lumbar epidural abscess or endocarditis (given back pain), overdose of clonidine (has overdosed in past), volume depletion (doubt),  or cardiac event (doubt). ->trop nml ->LA less  than 2 Plan Will repeat fluid challenge #5 BCx2, PCT algo MR spine (thoracic and Lumbar) IV vanc and rocephin Accept MAP >55 given nml LA and mental status   IV drug abuse H/o bipolar disease, Depression w/ prior suicide attempt after wife died Plan Will need grief counciing after  Resume home meds when appropriate  AKI Plan IVFs Renal dose meds F/u am chem   Mild hyperglycemia  Plan ssi    FAMILY  - Updates:   - Inter-disciplinary family meet or Palliative Care meeting due by:  9/20  Simonne MartinetPeter E Gallegos ACNP-BC [facility] Pulmonary/Critical Care Pager # [phone] OR # [phone] if no answer   11/11/2016, 2:08 PM   STAFF NOTE: I, Rory Percyaniel Feinstein, MD FACP have personally reviewed patient's available data, including medical history, events of note, physical examination and test results as part of my evaluation. I have discussed with resident/NP and other care providers such as pharmacist, RN and RRT. In addition, I personally evaluated patient and elicited key findings of: awake, no distress, lungs clear, abdo  obese nontender, no bruit neck, good pulses, clear trrack marks on ext, he had LOC, I do not feel he coded or lost pulse, likely was just hypotensive, CT I reviewe does not show PNA, no aortic dissection, no PE, he has got 3-4 liters and BP is improving, he was placed on levophed and now is off, I feel his etiology of his hypotension is likley cocaine related vs clonidine OD vs sepsis ( unlikely ), he does not have SIRS, he is responding to volume, will give one more liter , lactic is very reassuring , he is perfusing well with current MAP, goal is MAP 55 with good MS and urine output 20 cc/hr- 25, will send BC, empiric endocarditis ABX, ctx, vanc, get MRI back with pain and likely IVDA to r/o epirdual abscess, hold all anti htn, counsel him on no drug use, get carotids, updated pt in full The patient is critically ill with multiple organ systems failure and requires high  complexity decision making for assessment and support, frequent evaluation and titration of therapies, application of advanced monitoring technologies and extensive interpretation of multiple databases.   Critical Care Time devoted to patient care services described in this note is . This time reflects time of care of this signee: Rory Percy, MD FACP. This critical care time does not reflect procedure time, or teaching time or supervisory time of PA/NP/Med student/Med Resident etc but could involve care discussion time. Rest per NP/medical resident whose note is outlined above and that I agree with   [name]. Tyson Alias, MD, FACP Pgr: [phone] [facility] 11/11/2016 3:23 PM

## 2016-11-11 NOTE — Progress Notes (Signed)
Pharmacy Antibiotic Note  Bryan Gallegos is a 41 y.o. male admitted on 11/11/2016 with sepsis.  Pharmacy has been consulted for Vancomycin and Ceftriaxone dosing. WBC elevated at 11.7. SCr 1.35 (BL ~ 0.8). nCrCl ~ 70 mL/min.    Plan: -Ceftriaxone 2 gm IV Q 24 hours -Vancomycin 2500 mg IV once, then Vancomycin 1250 mg IV Q 12 hours -Monitor CBC, renal fx, cultures and clinical progress -VT at SS      Temp (24hrs), Avg:98.2 F (36.8 C), Min:98 F (36.7 C), Max:98.4 F (36.9 C)   Recent Labs Lab 11/11/16 1022 11/11/16 1034  WBC 11.7*  --   CREATININE 1.35*  --   LATICACIDVEN  --  1.85    CrCl cannot be calculated (Unknown ideal weight.).    Allergies  Allergen Reactions  . Ace Inhibitors Anaphylaxis  . Atenolol Anaphylaxis  . Lisinopril Anaphylaxis  . Prednisone Hives  . Ibuprofen Itching, Swelling and Other (See Comments)    Hand/feet swelling   . Tylenol [Acetaminophen] Other (See Comments)    Reaction:  GI bleeding     Antimicrobials this admission: Vanc 9/13 >>  Ceftriaxone 9/13 >>   Dose adjustments this admission: None   Microbiology results: 9/13 BCx:   Thank you for allowing pharmacy to be a part of this patient's care.  Vinnie LevelBenjamin Yatziry Deakins, PharmD., BCPS Clinical Pharmacist Pager [phone]

## 2016-11-11 NOTE — Progress Notes (Signed)
I am in elink and carrying 667 pager. ER nurse called to say patient removing his wrist band etc., put his clothes on and walking out AMA from ER  Dr. Kalman ShanMurali Janiylah Hannis, M.D., [facility] LL[facility].C.C.P Pulmonary and Critical Care Medicine Staff Physician [facility] Pager: [phone], If no answer or between  15:00h - 7:00h: call [phone]  11/11/2016 4:58 PM

## 2016-11-11 NOTE — ED Notes (Signed)
Pt to CT

## 2016-11-16 LAB — CULTURE, BLOOD (SINGLE)
CULTURE: NO GROWTH
Special Requests: ADEQUATE

## 2016-12-27 IMAGING — CT CT HEAD W/O CM
1 of 2 series · 13 of 30 positions shown, 17 images · non-contrast
Comparison: 08/18/2014

CLINICAL DATA: Code stroke, morbid obesity

EXAM:
CT HEAD WITHOUT CONTRAST
TECHNIQUE: Contiguous axial images were obtained from the base of the skull
through the vertex without contrast.

[Series 2: headtrauma 4.8 h37s · axial · 0.54mm/px · z∈[+101,+244]mm · 13 of 36 slices shown, 17 images]
[im 3/36  brain]
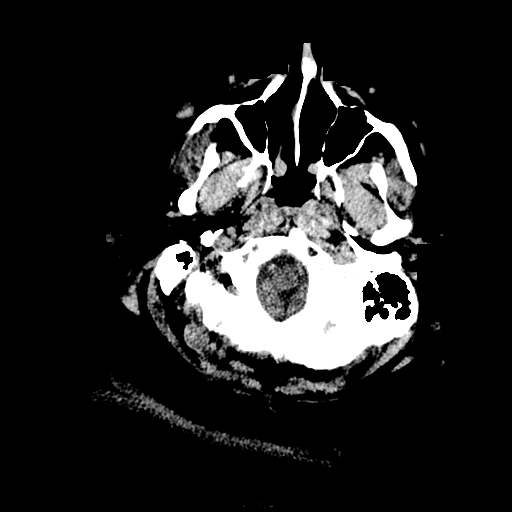
[im 3/36  bone]
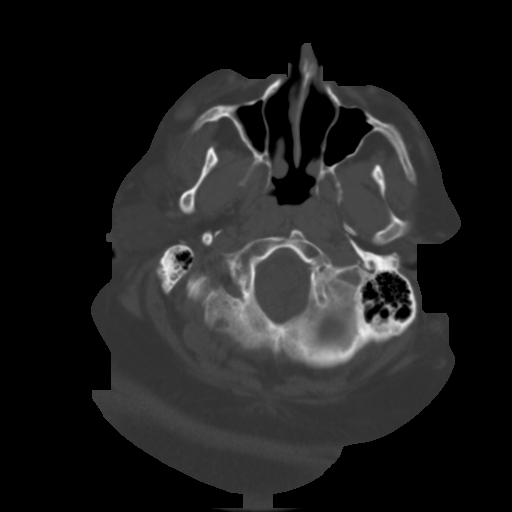
[im 6/36  brain]
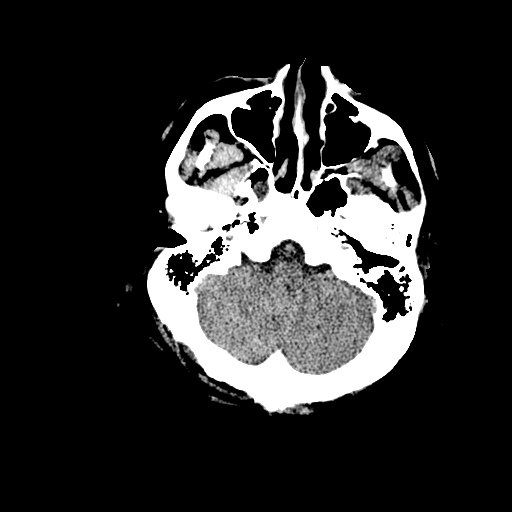
[im 8/36  brain]
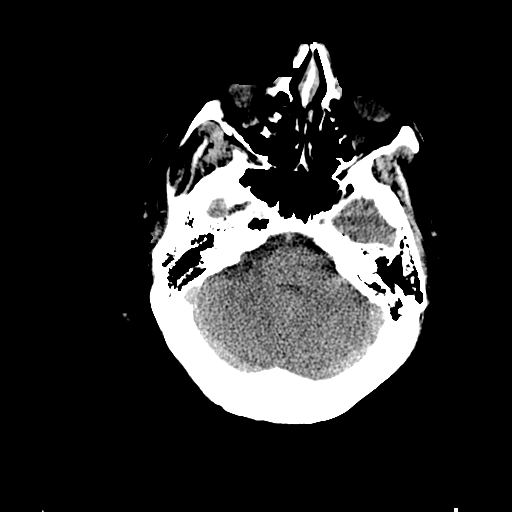
[im 11/36  brain]
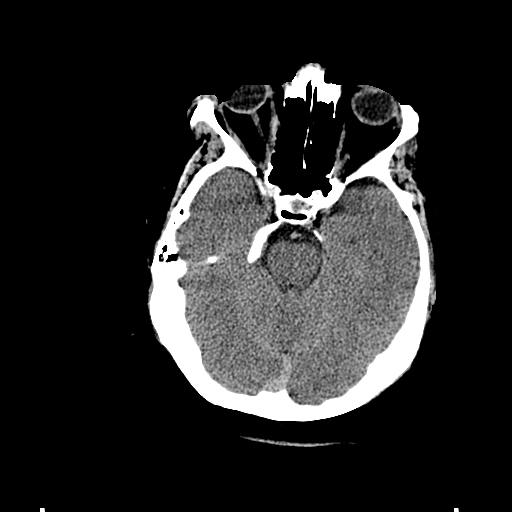
[im 13/36  brain]
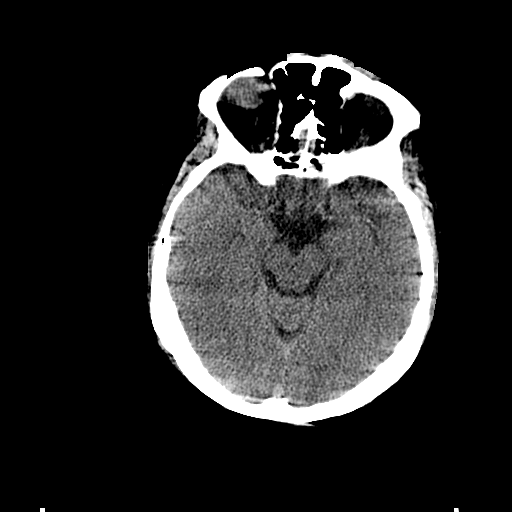
[im 13/36  bone]
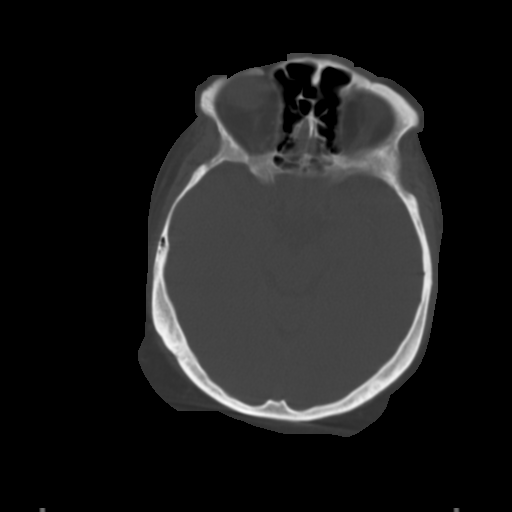
[im 16/36  brain]
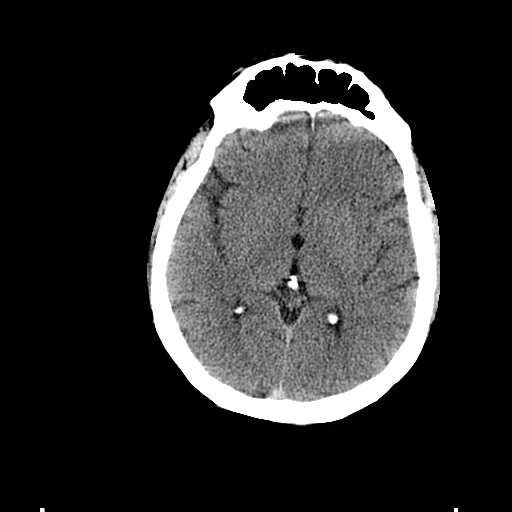
[im 18/36  brain]
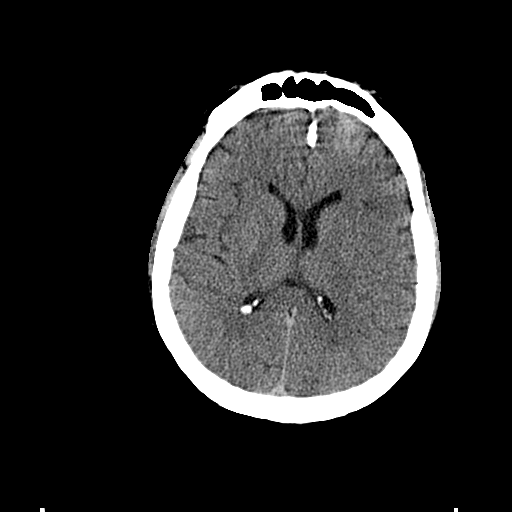
[im 21/36  brain]
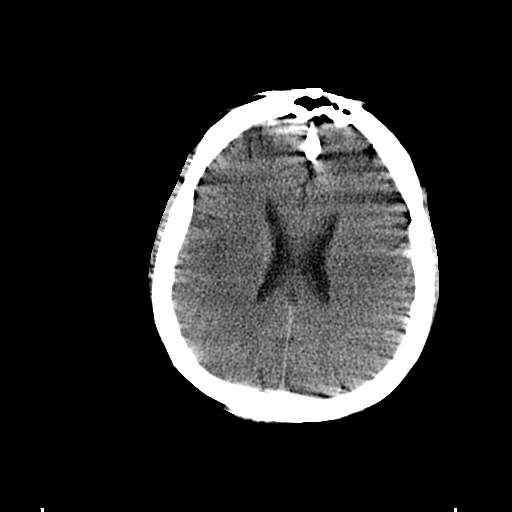
[im 23/36  brain]
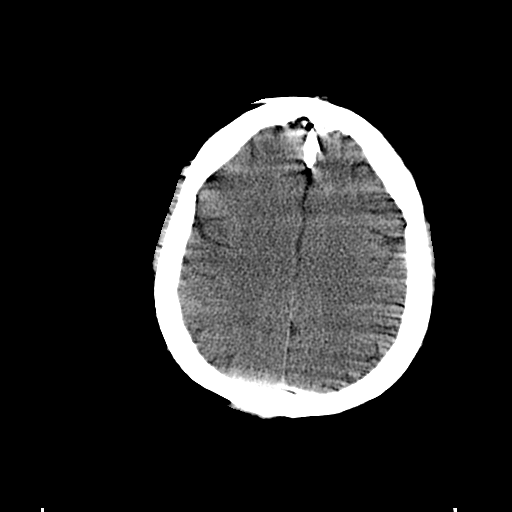
[im 23/36  bone]
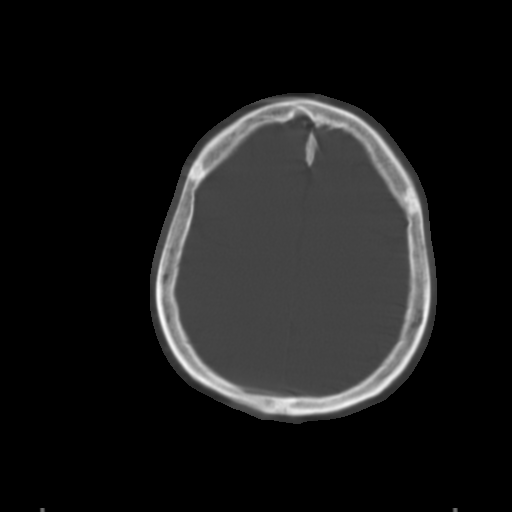
[im 26/36  brain]
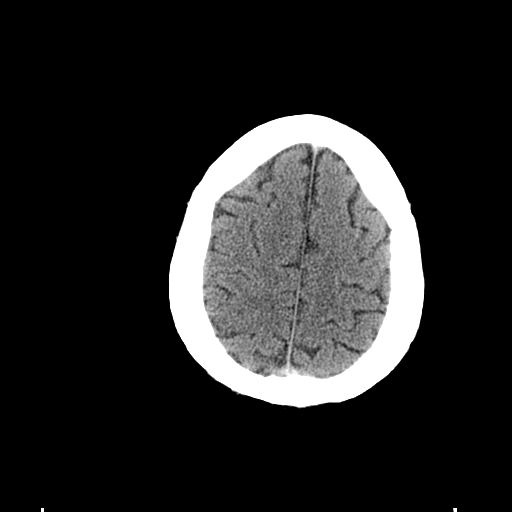
[im 28/36  brain]
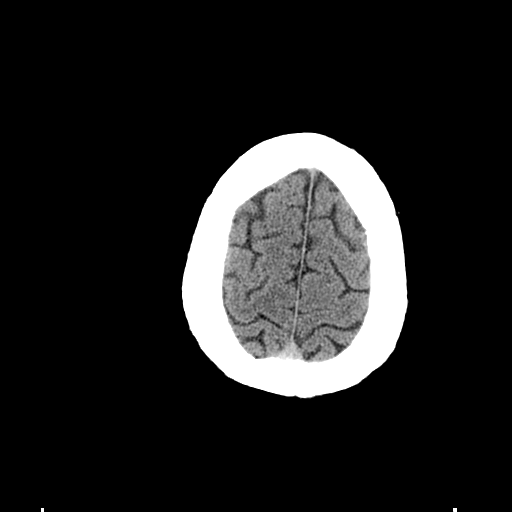
[im 31/36  brain]
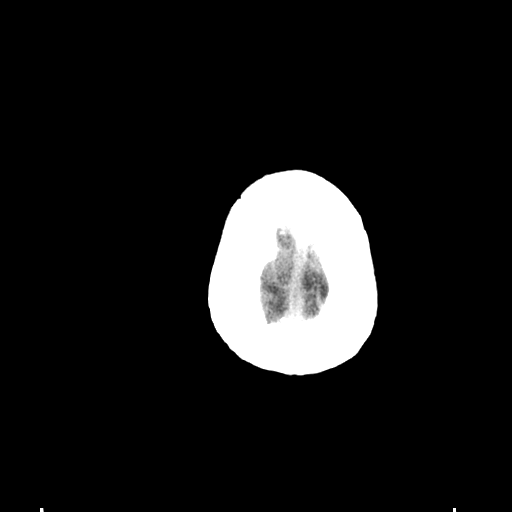
[im 33/36  brain]
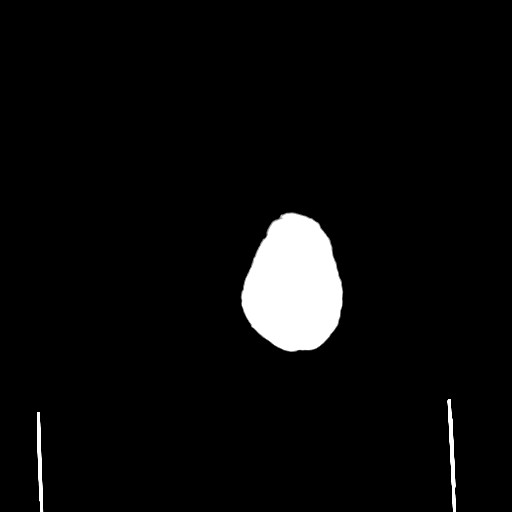
[im 33/36  bone]
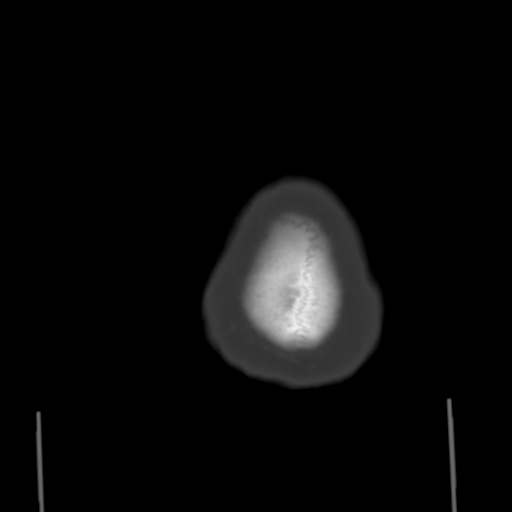

[13 of 30 positions shown; findings below may reference images not displayed]

FINDINGS: Limited exam with motion artifact. No acute intracranial hemorrhage,
definite infarction, mass lesion, mass effect, focal edema. Normal
gray-white matter differentiation. No extra-axial fluid collection
demonstrated. Cisterns are patent. No cerebellar abnormality. Orbits
are symmetric. Minor right sphenoid mucosal thickening. Other
sinuses and mastoids remain clear. No skull abnormality.
IMPRESSION: Limited exam but no acute intracranial process by noncontrast CT.

These results were called by telephone at the time of interpretation
on 03/30/2015 at [DATE] to Dr. MARIOSPETROU NYGAS , who verbally
acknowledged these results.

## 2016-12-27 IMAGING — CT CT ABD-PELV W/O CM
2 of 7 series · 15 of 46 positions shown, 19 images · non-contrast
Comparison: 06/07/2014

CLINICAL DATA: Periumbilical abdominal pain

EXAM:
CT ABDOMEN AND PELVIS WITHOUT CONTRAST
TECHNIQUE: Multidetector CT imaging of the abdomen and pelvis was performed
following the standard protocol without IV contrast.

[Series 2: abdomen/pelvis w/o contrast · axial · non-contrast · 0.98mm/px · z∈[-524,-64]mm · 12 of 107 slices shown, 16 images]
[im 10/107  soft-tissue]
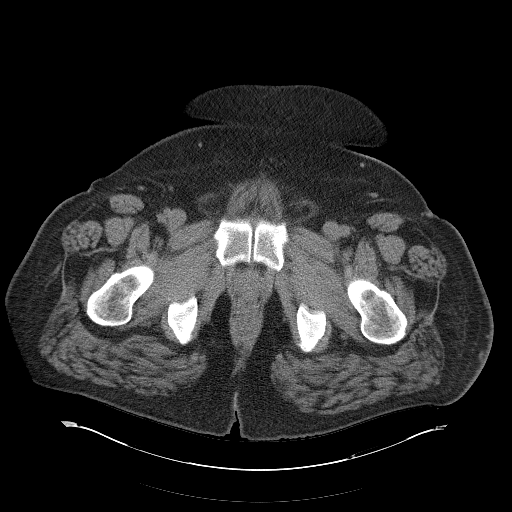
[im 10/107  bone]
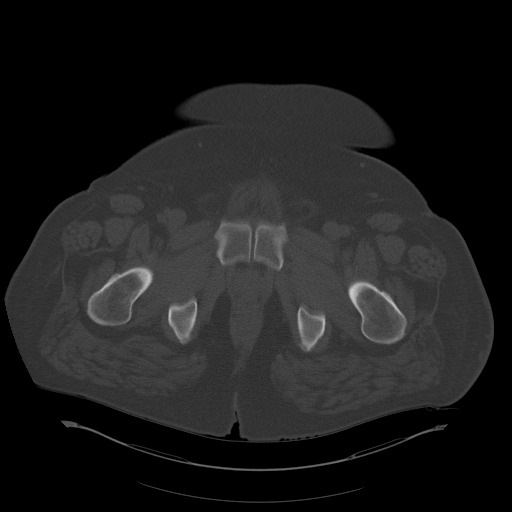
[im 20/107  soft-tissue]
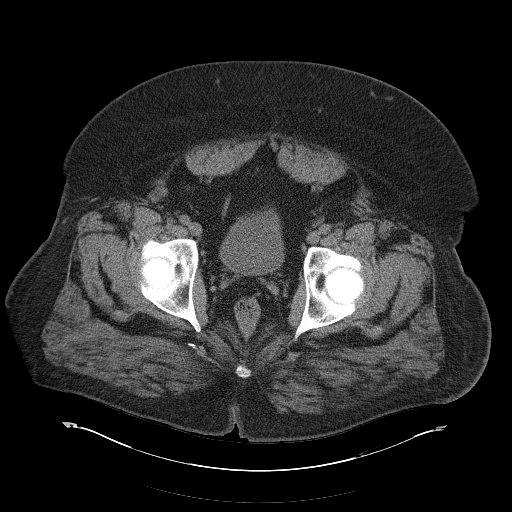
[im 29/107  soft-tissue]
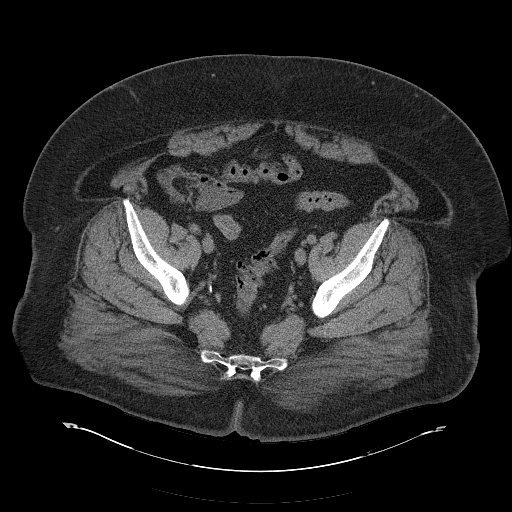
[im 39/107  soft-tissue]
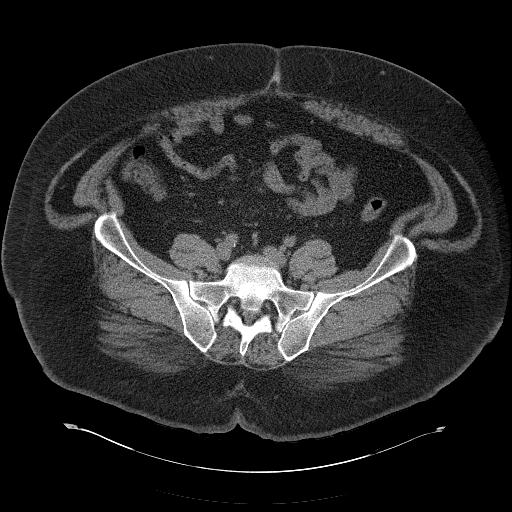
[im 49/107  soft-tissue]
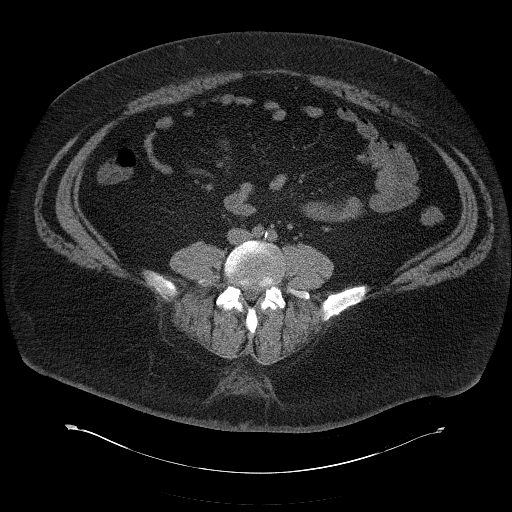
[im 58/107  soft-tissue]
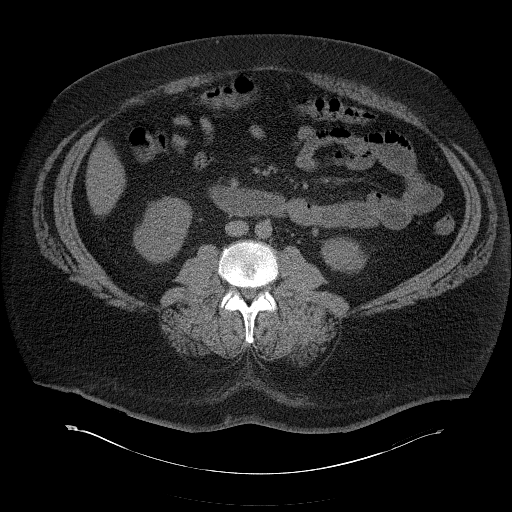
[im 68/107  soft-tissue]
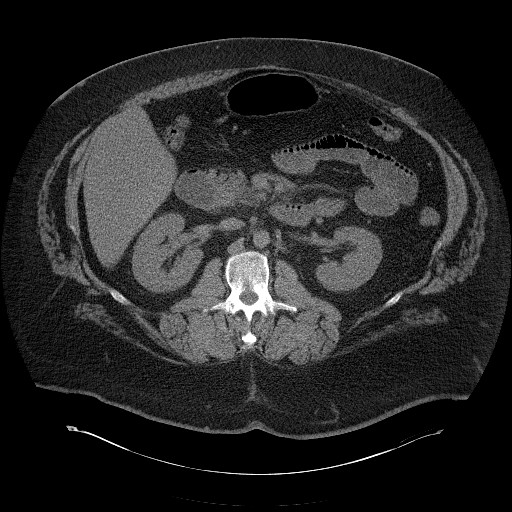
[im 78/107  soft-tissue]
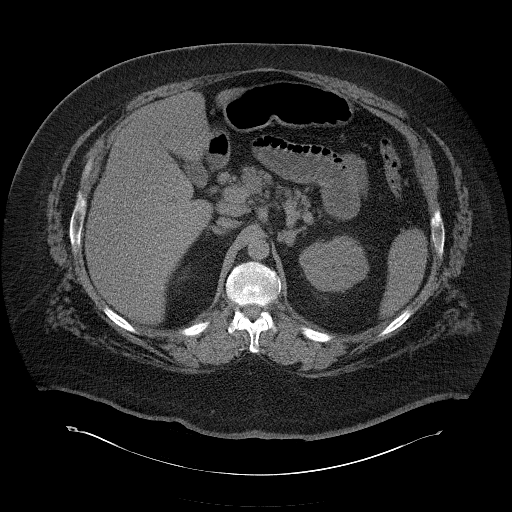
[im 87/107  soft-tissue]
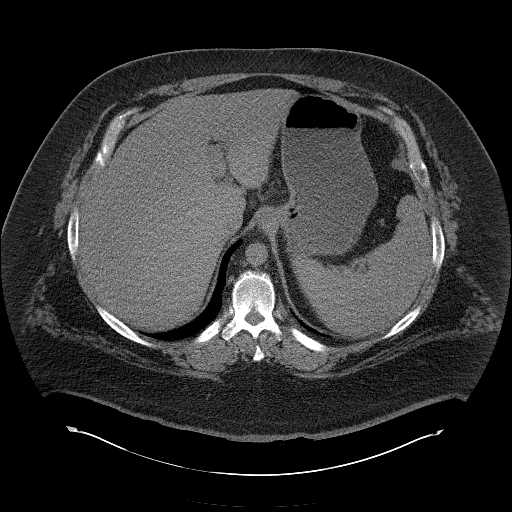
[im 87/107  lung]
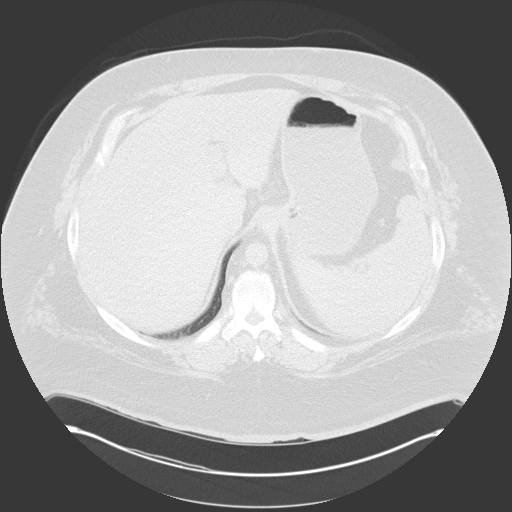
[im 87/107  bone]
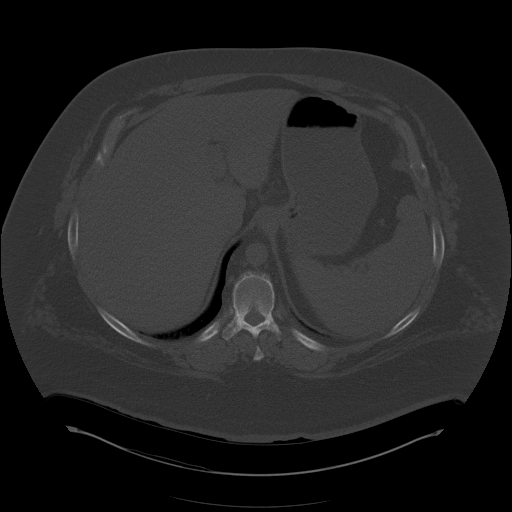
[im 92/107  lung]
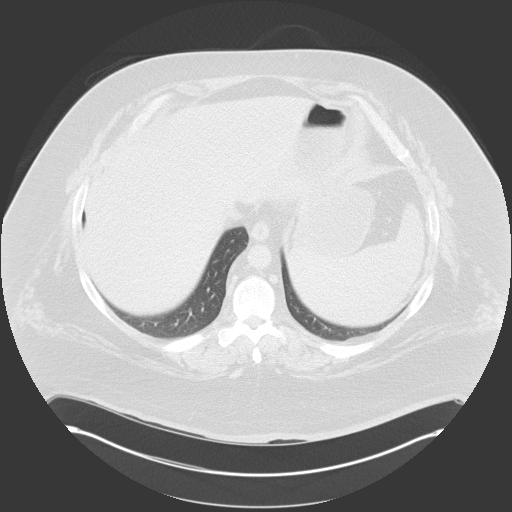
[im 97/107  soft-tissue]
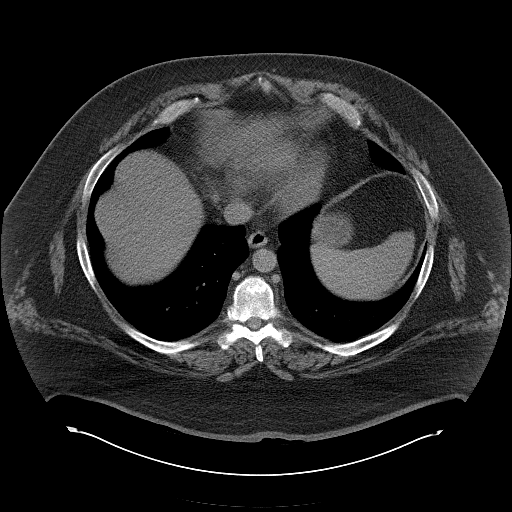
[im 97/107  lung]
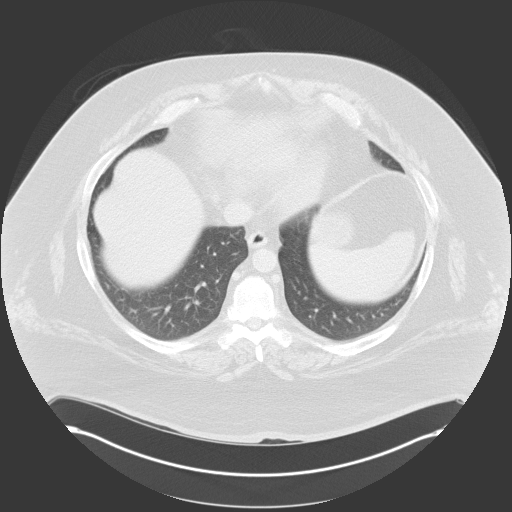
[im 102/107  lung]
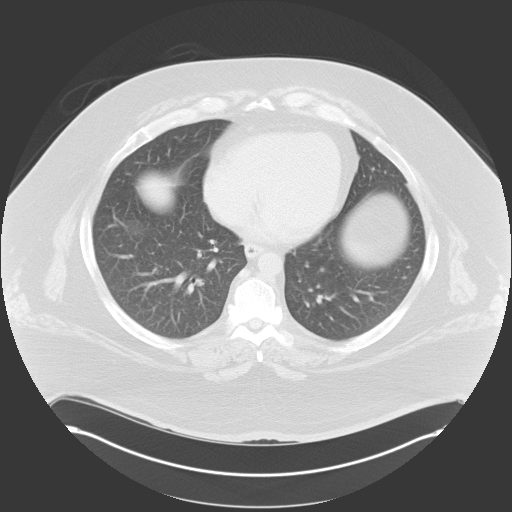

[Series 5: mpr cor 3.0mm · coronal · 1.04mm/px · 3 of 121 slices shown]
[im 31/121  soft-tissue]
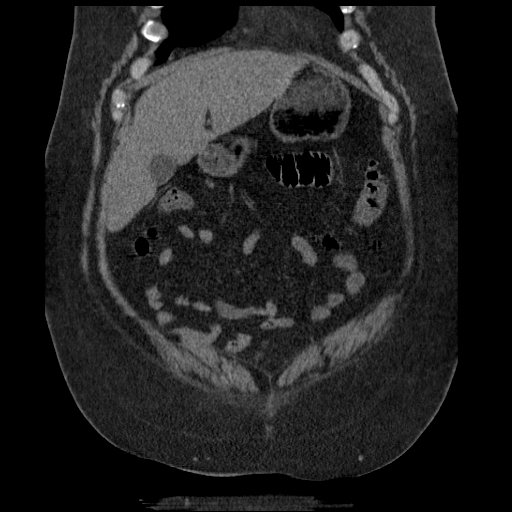
[im 61/121  soft-tissue]
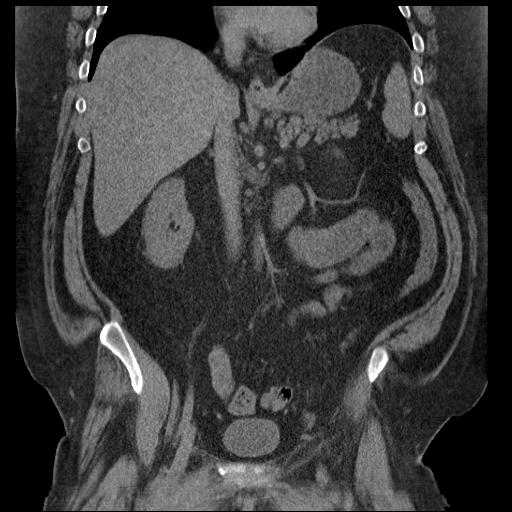
[im 91/121  soft-tissue]
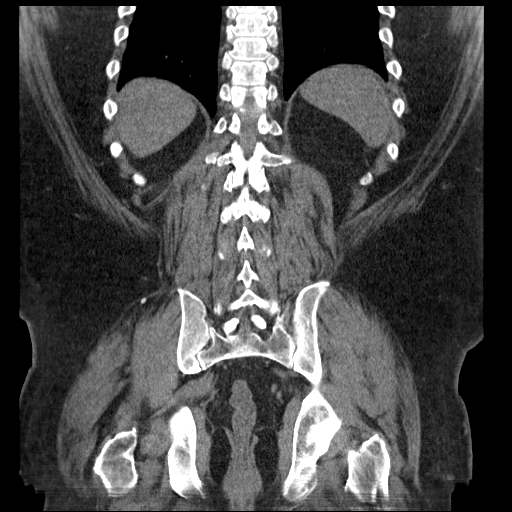

[15 of 46 positions shown; findings below may reference images not displayed]

FINDINGS: Lung bases are free of acute infiltrate or sizable effusion.
Pulmonary nodule is again noted in the posterior right lower lobe
stable from the prior exam.

The liver, gallbladder, spleen, adrenal glands and pancreas are
within normal limits. Mild aortic calcifications are seen.

The appendix is well visualized and within normal limits. Minimal
diverticular change of the colon is seen. The bladder is partially
distended. No pelvic mass lesion or sidewall abnormality is seen.
The osseous structures show no acute abnormality.
IMPRESSION: No acute abnormality noted.

Stable pulmonary nodule consistent with benign etiology as
previously described.

## 2016-12-29 IMAGING — CR DG CHEST 1V PORT
1 series · 1 of 1 positions shown · non-contrast
Comparison: Radiographs 08/18/2014

CLINICAL DATA: Sharp central and left-sided chest pain. Shortness
of breath. Symptom onset 3 hours prior.

EXAM:
PORTABLE CHEST 1 VIEW

[ap]
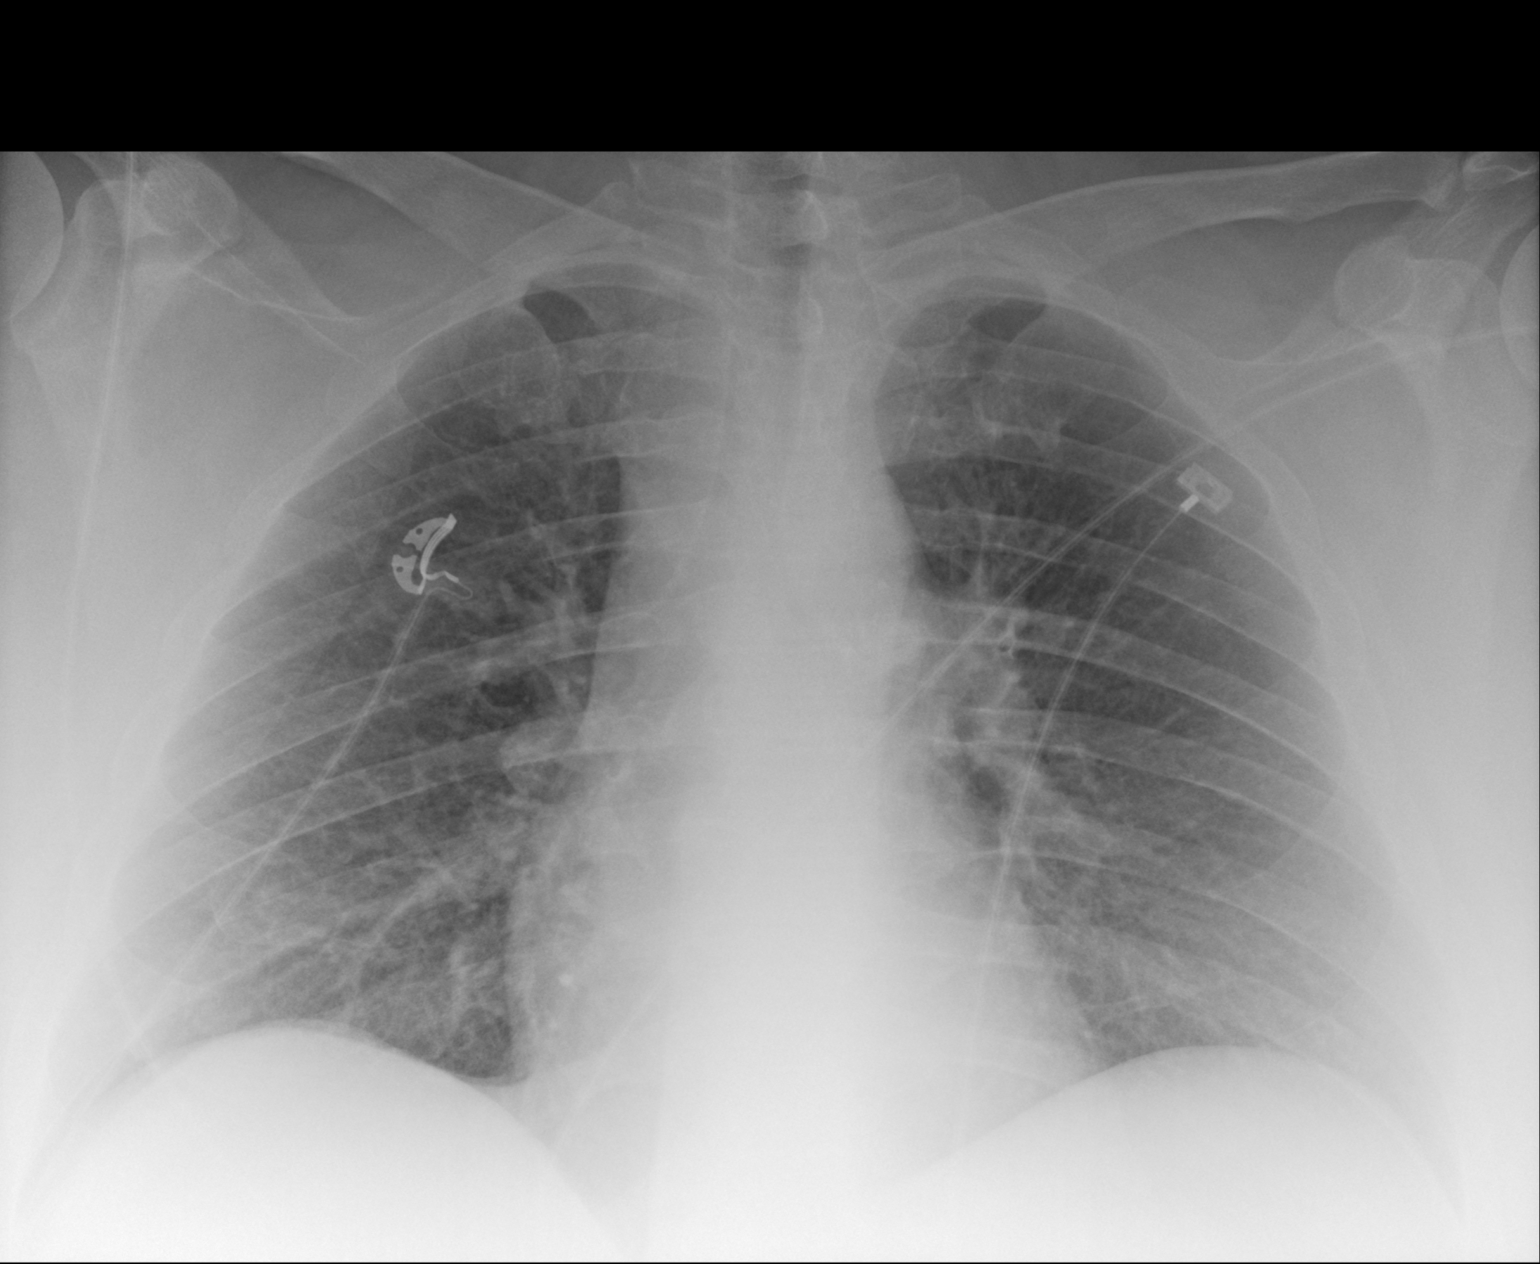

[1 of 1 positions shown; findings below may reference images not displayed]

FINDINGS: The cardiomediastinal contours are normal. Central bronchial
thickening unchanged. Pulmonary vasculature is normal. No
consolidation, pleural effusion, or pneumothorax. No acute osseous
abnormalities are seen.
IMPRESSION: No acute pulmonary process.  Stable bronchitic change.

## 2017-01-01 IMAGING — DX DG CHEST 1V
1 series · 1 of 1 positions shown · non-contrast
Comparison: Chest radiograph 04/01/2015

CLINICAL DATA: Patient swallowed multiple batteries.

EXAM:
CHEST 1 VIEW

[chest pa]
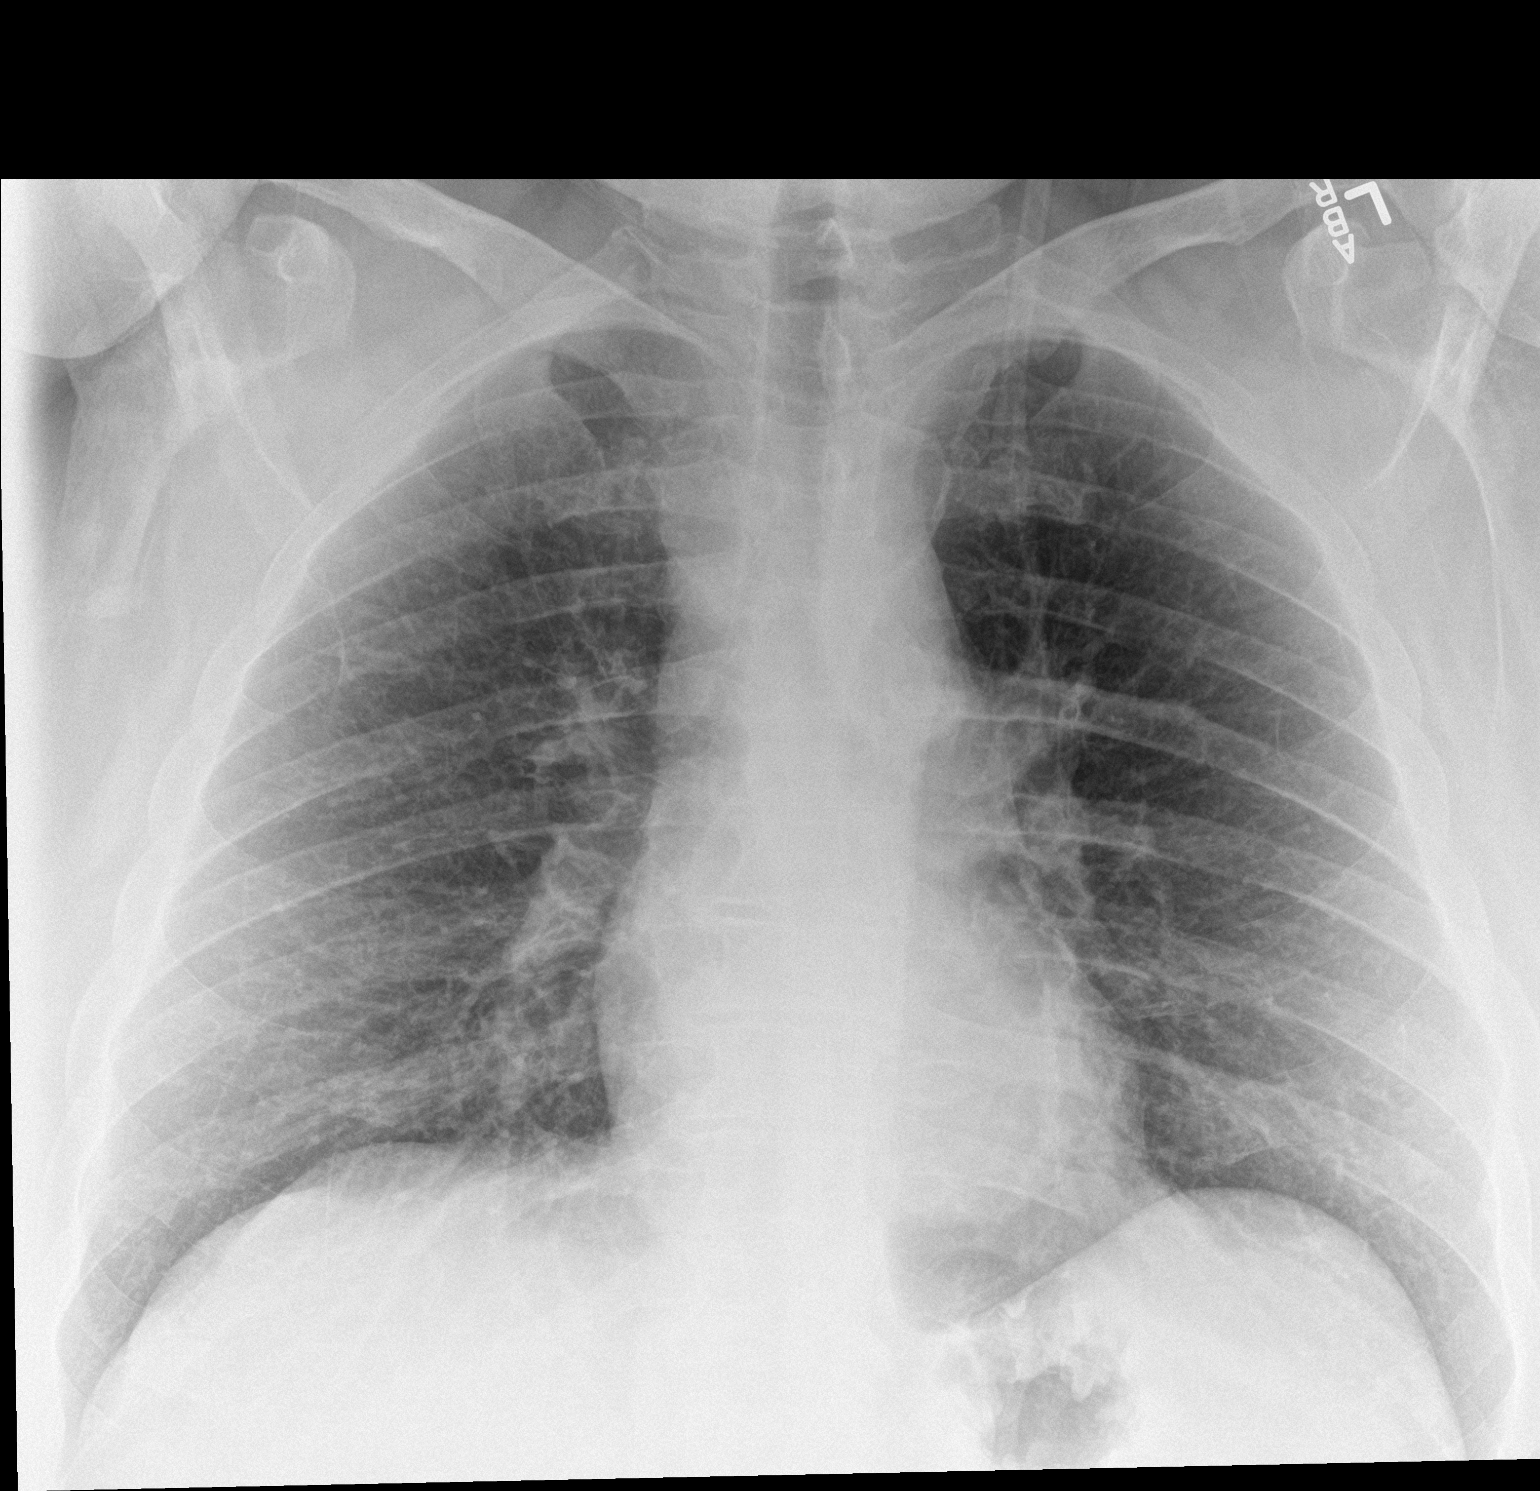

[1 of 1 positions shown; findings below may reference images not displayed]

FINDINGS: Normal cardiac and mediastinal contours. No consolidative pulmonary
opacities. No pleural effusion or pneumothorax. Regional skeleton is
unremarkable.
IMPRESSION: No acute cardiopulmonary process.

## 2017-01-01 IMAGING — DX DG ABDOMEN 1V
3 series · 3 of 3 positions shown · non-contrast
Comparison: CT abdomen pelvis 03/30/2015

CLINICAL DATA: Patient swallowed multiple batteries.

EXAM:
ABDOMEN - 1 VIEW

[abdomen kub (1 of 3)]
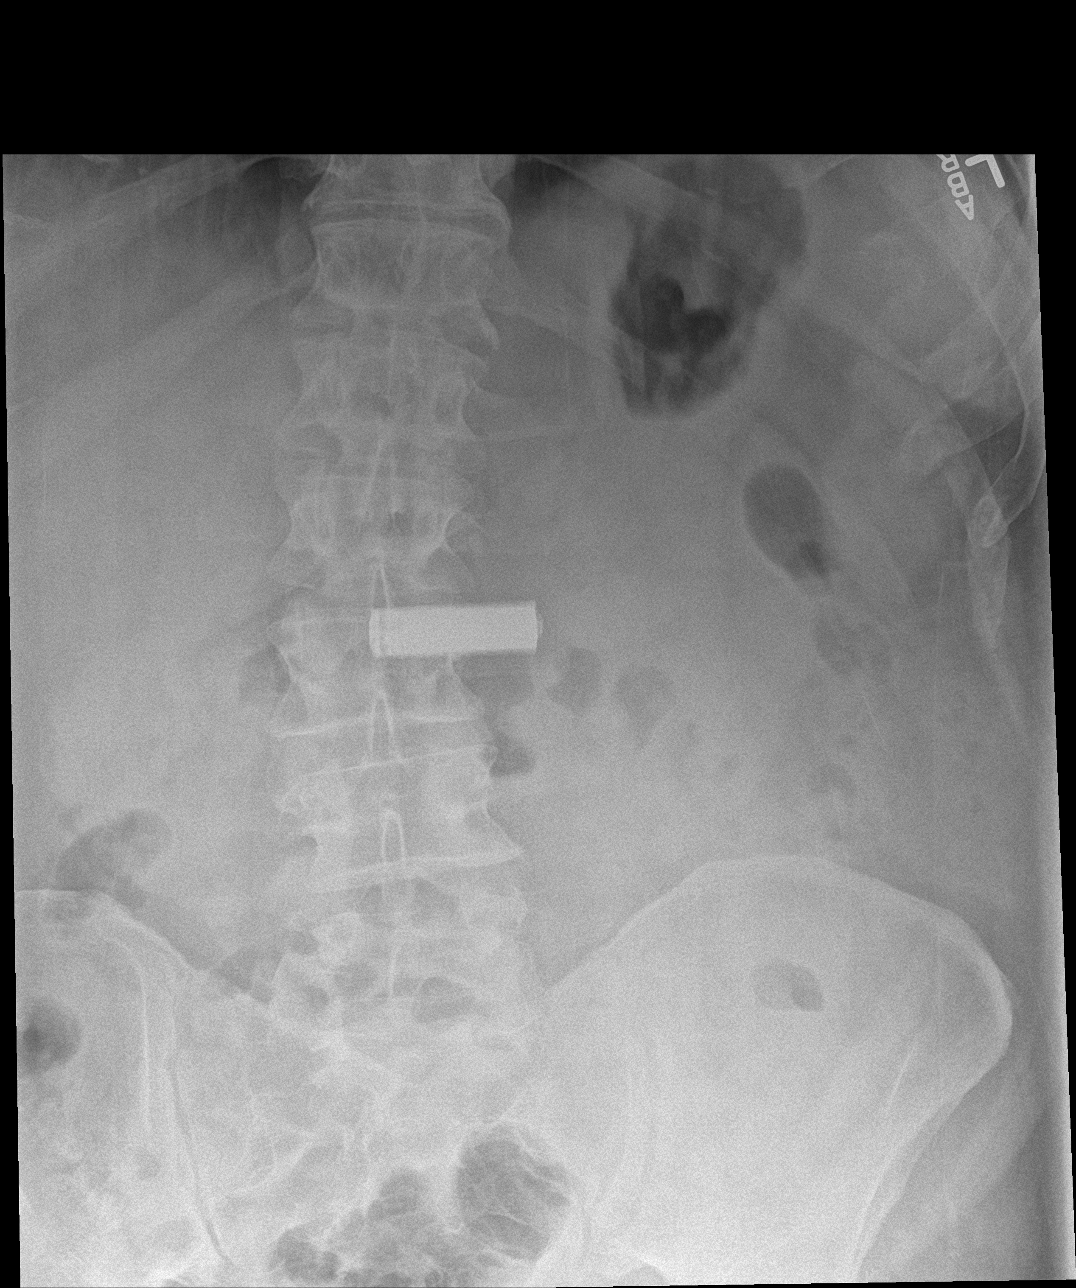

[abdomen kub (2 of 3)]
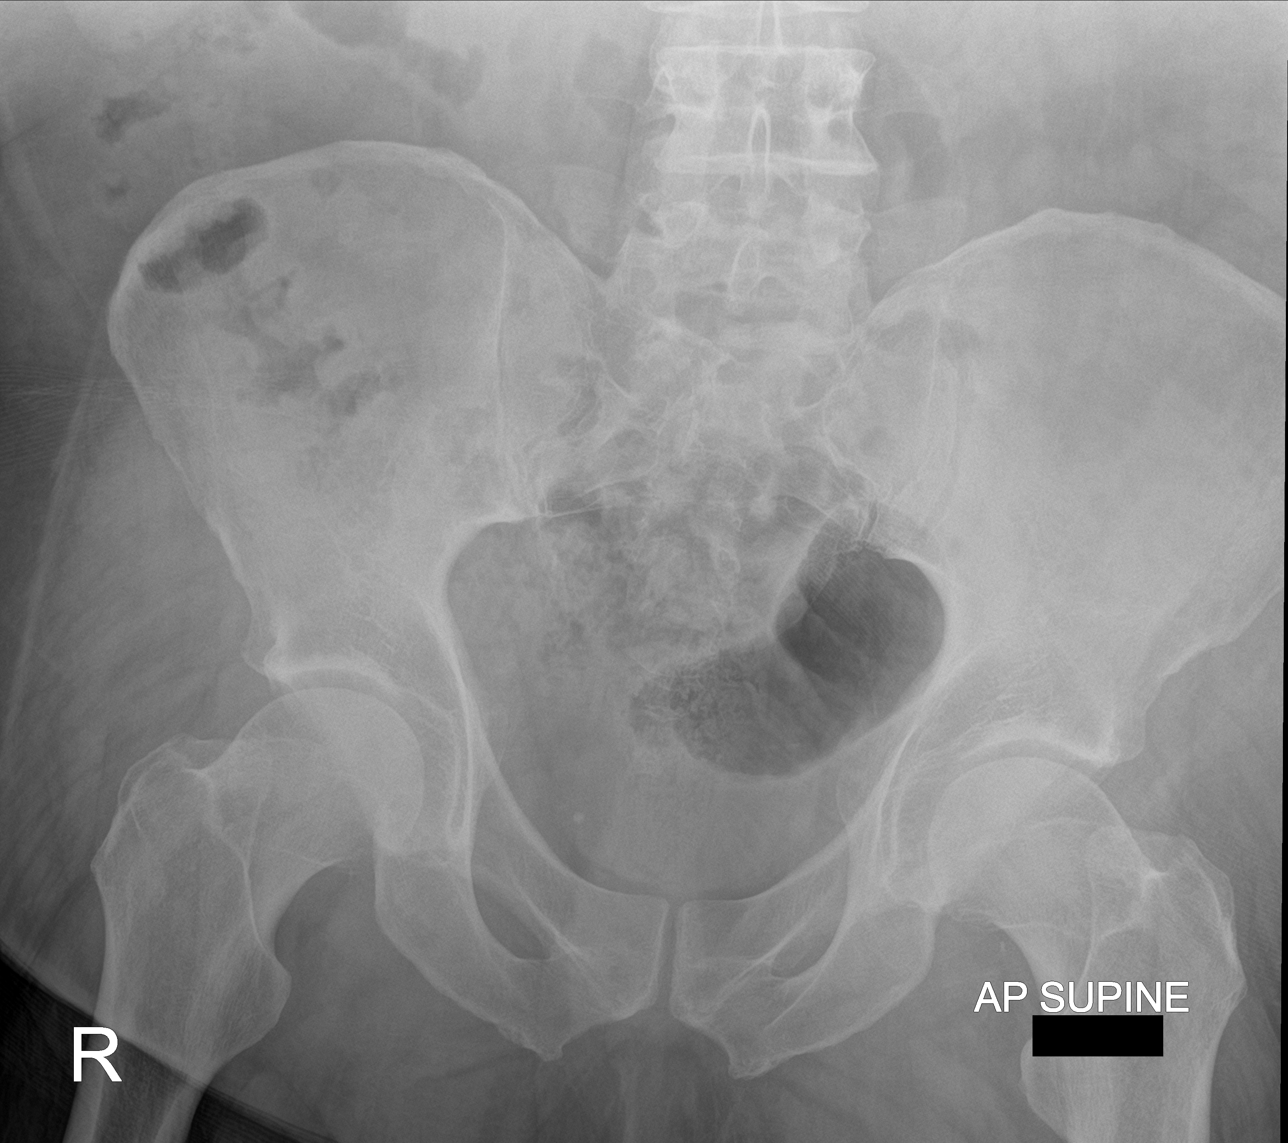

[abdomen kub (3 of 3)]
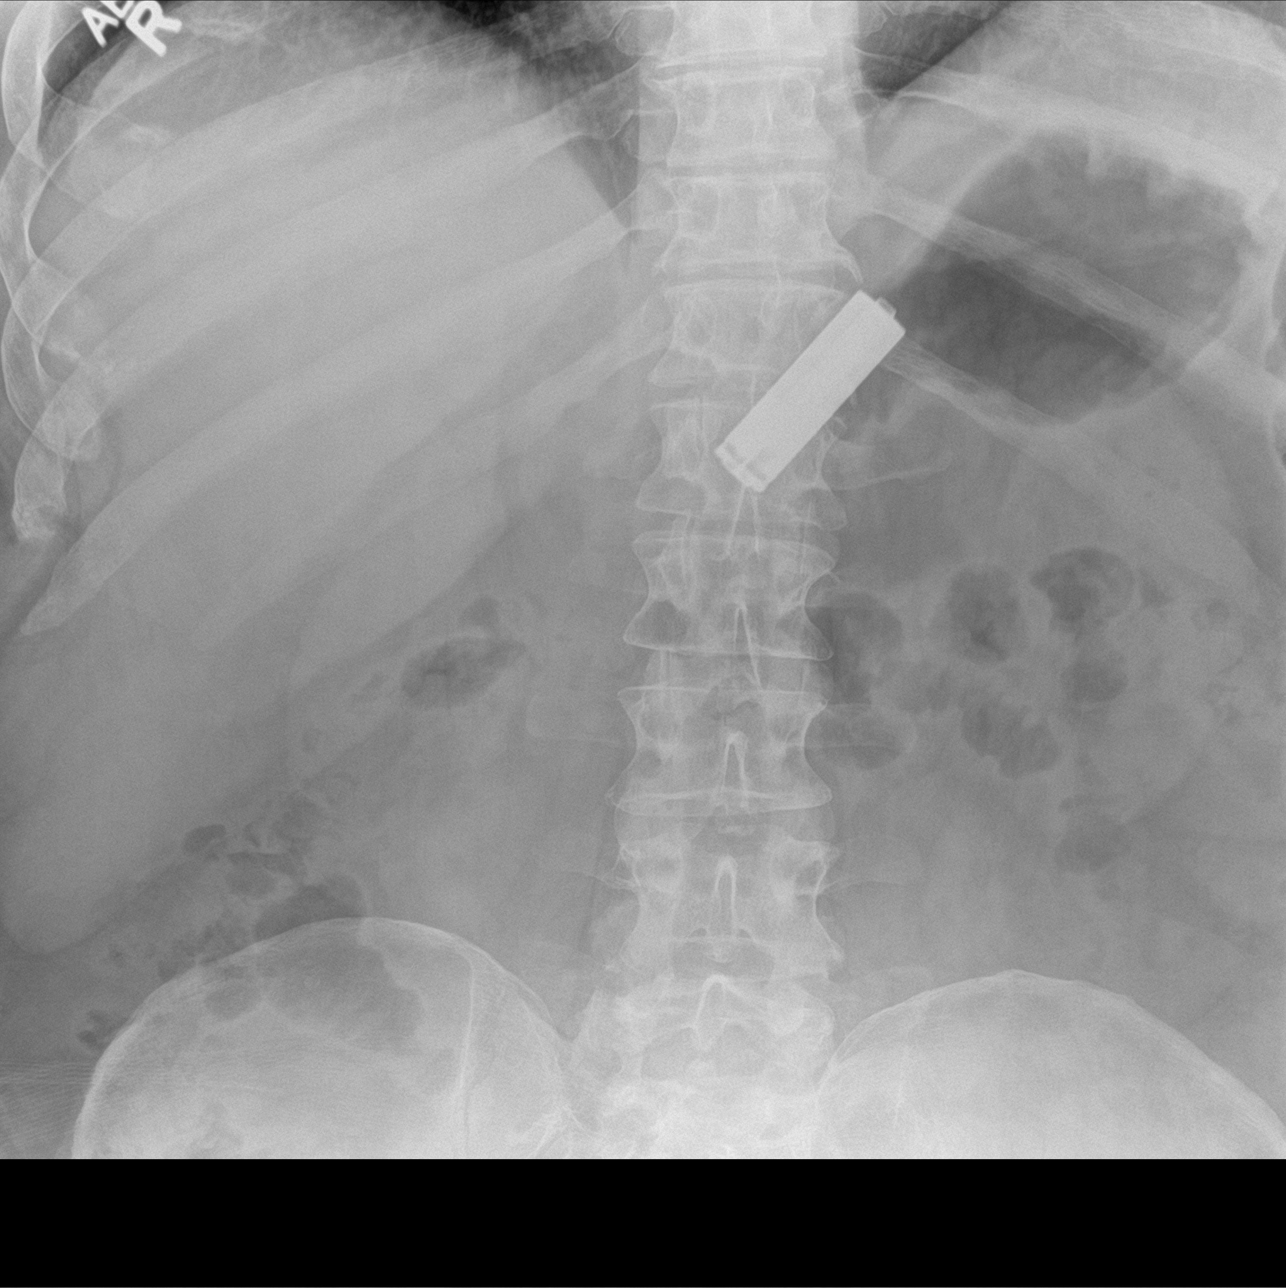

[3 of 3 positions shown; findings below may reference images not displayed]

FINDINGS: Large radiodensity within the central abdomen. Nonobstructed bowel
gas pattern. Lung bases are clear. No free fluid or free
intraperitoneal air. Regional skeleton is unremarkable.
IMPRESSION: Radiodensity within the central abdomen likely corresponds with
reported history of swallowed battery.

Nonobstructed bowel gas pattern.

## 2017-01-05 IMAGING — CT CT HEAD W/O CM
1 series · 16 of 30 positions shown, 20 images · non-contrast
Comparison: 03/30/2015.

CLINICAL DATA: Left-sided weakness and slurred speech. Multiple
recent syncopal episodes. The patient fell off a top bunk and hit
his head with resultant loss of consciousness.

EXAM:
CT HEAD WITHOUT CONTRAST
TECHNIQUE: Contiguous axial images were obtained from the base of the skull
through the vertex without intravenous contrast.

[Series 2: head wo · axial · 0.49mm/px · z∈[-141,-1]mm · 16 of 33 slices shown, 20 images]
[im 2/33  brain]
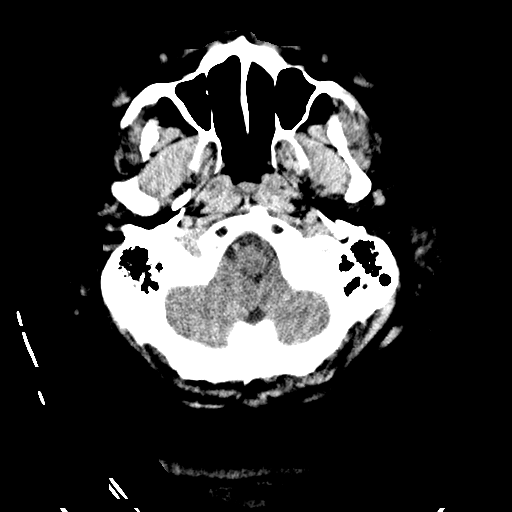
[im 2/33  bone]
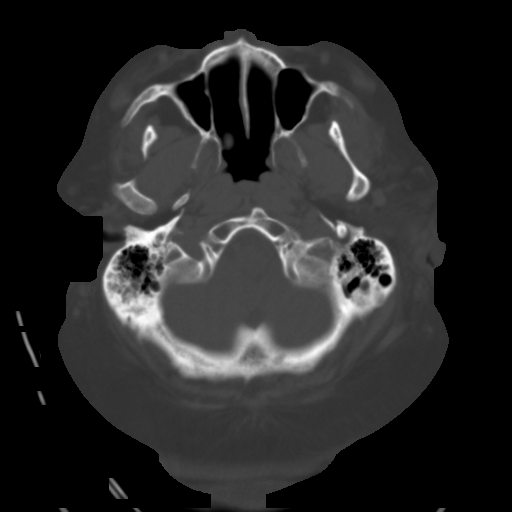
[im 4/33  brain]
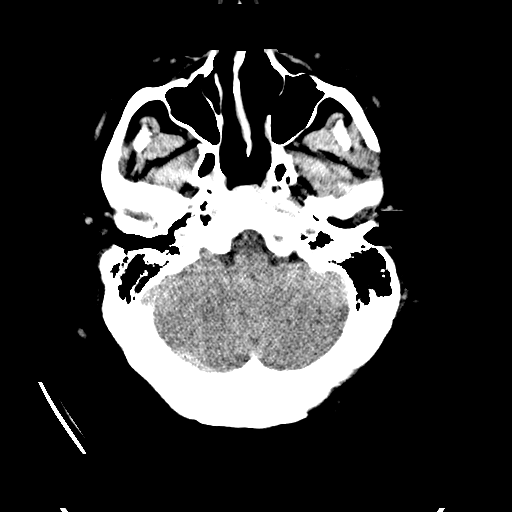
[im 6/33  brain]
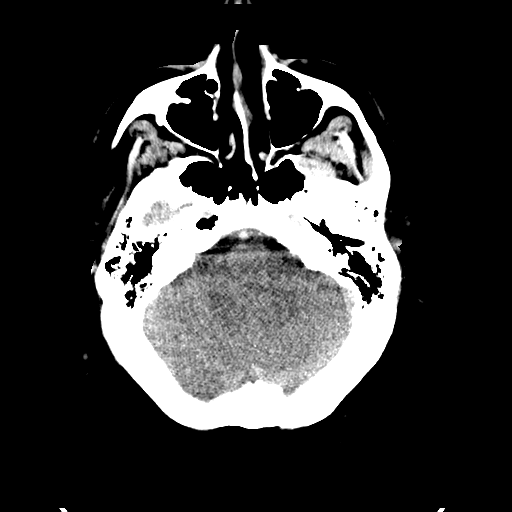
[im 8/33  brain]
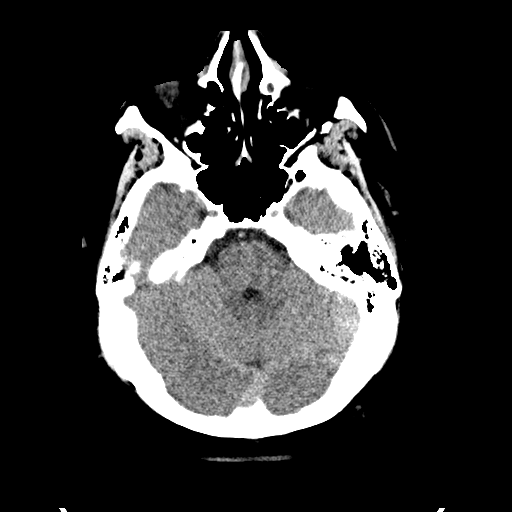
[im 9/33  brain]
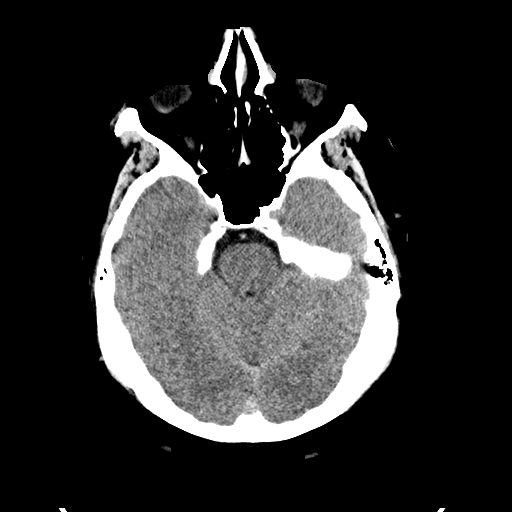
[im 9/33  bone]
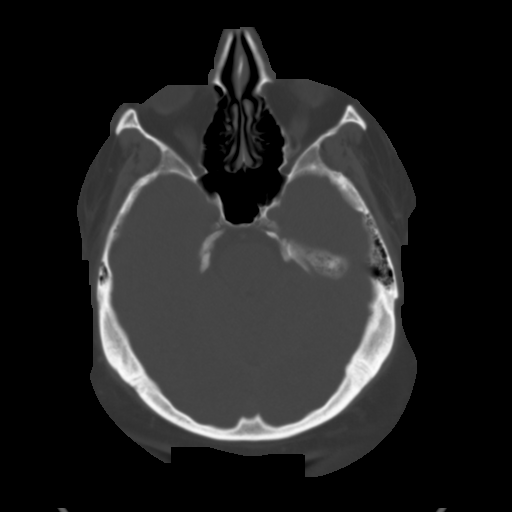
[im 12/33  brain]
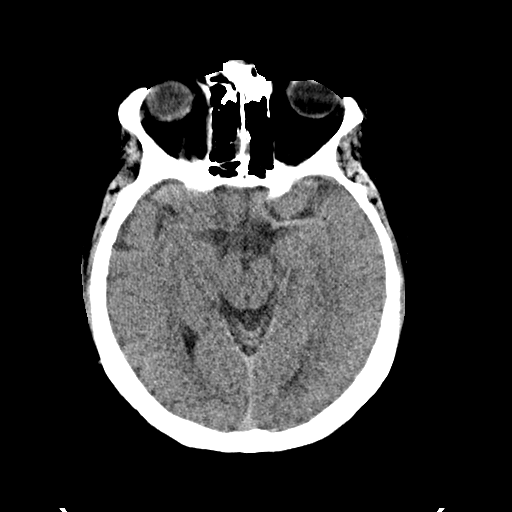
[im 14/33  brain]
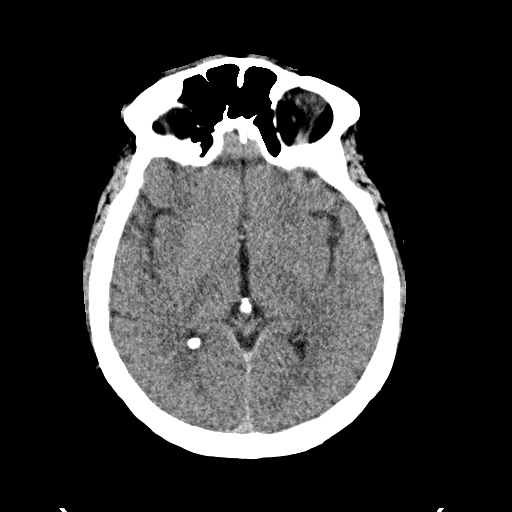
[im 16/33  brain]
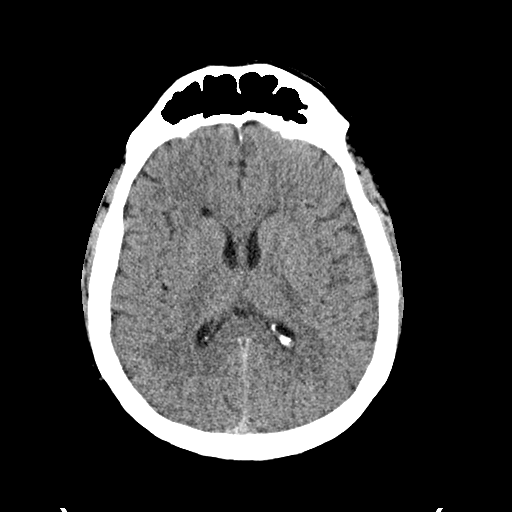
[im 17/33  brain]
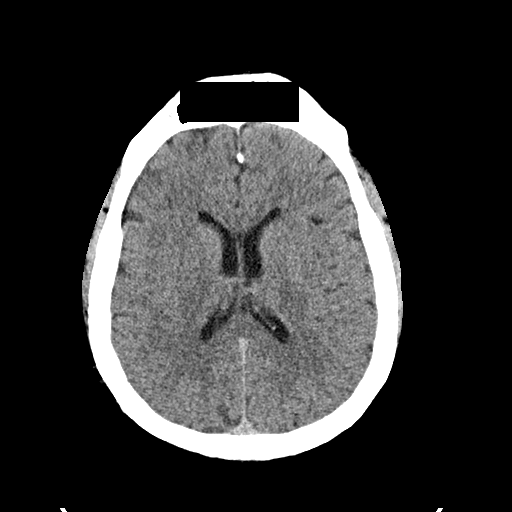
[im 17/33  bone]
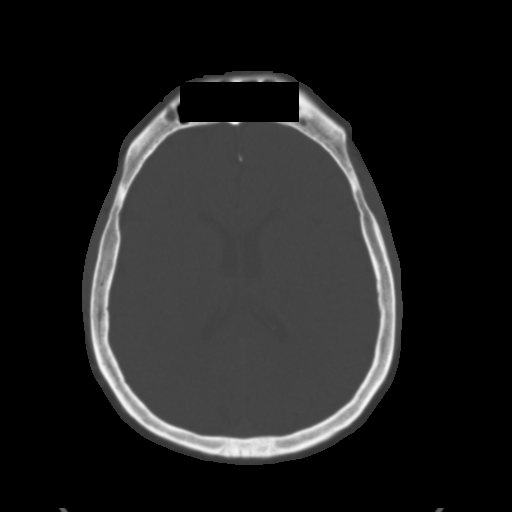
[im 19/33  brain]
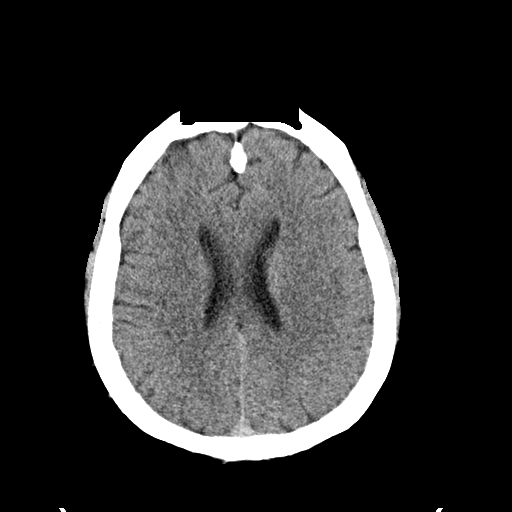
[im 21/33  brain]
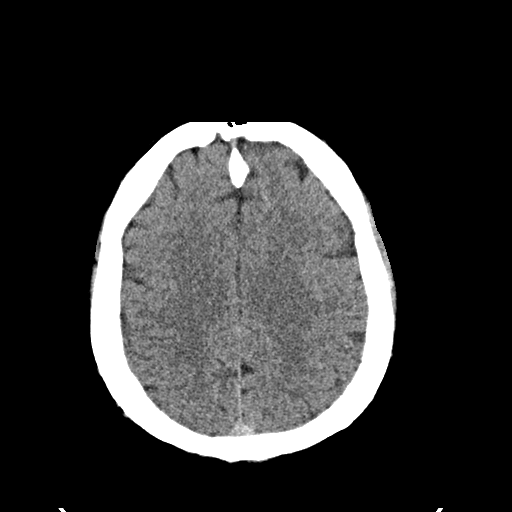
[im 24/33  brain]
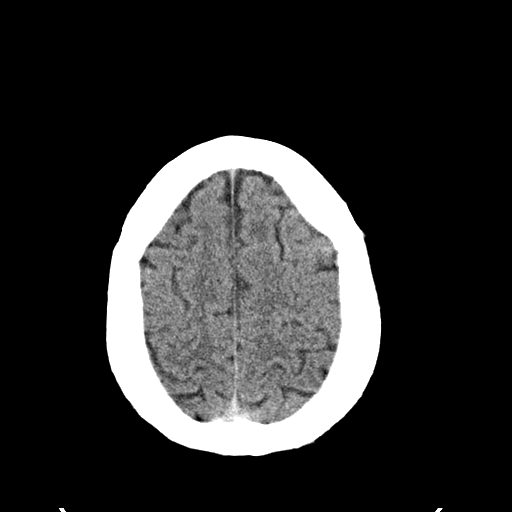
[im 25/33  brain]
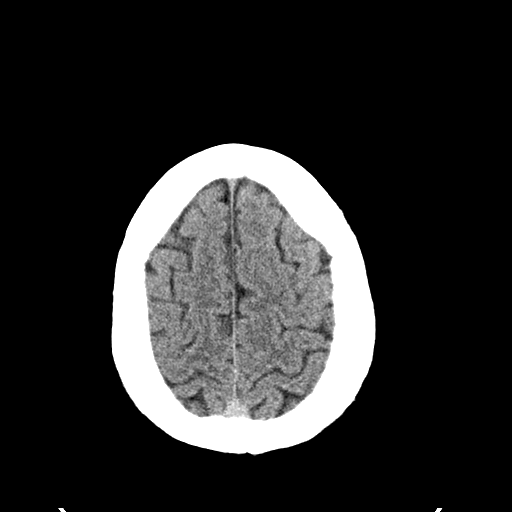
[im 25/33  bone]
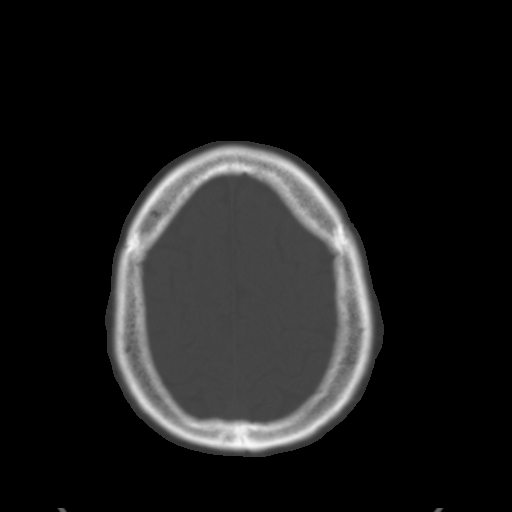
[im 27/33  brain]
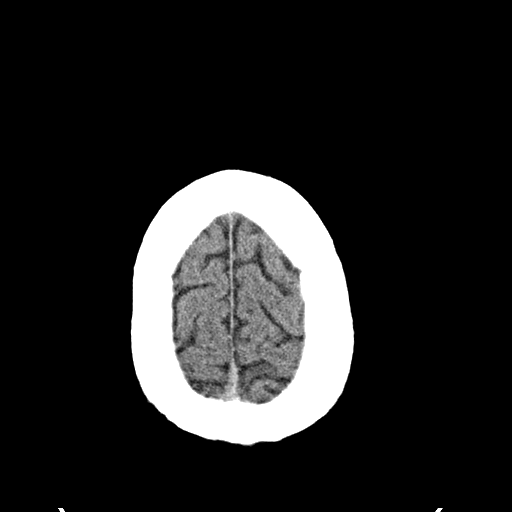
[im 29/33  brain]
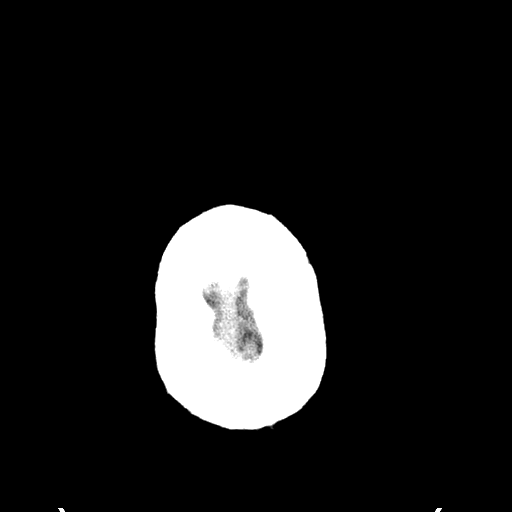
[im 31/33  brain]
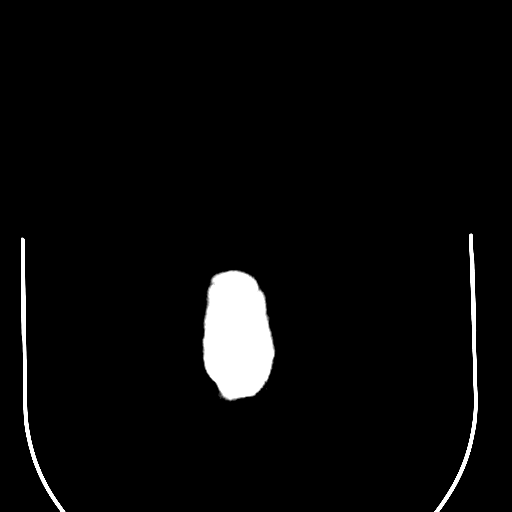

[16 of 30 positions shown; findings below may reference images not displayed]

FINDINGS: Normal appearing cerebral hemispheres and posterior fossa
structures. Normal size and position of the ventricles. No skull
fracture, intracranial hemorrhage or paranasal sinus air-fluid
levels. Inferior right sphenoid sinus mucosal thickening is again
noted.
IMPRESSION: 1. No acute abnormality.
2. Mild chronic right sphenoid sinusitis.

## 2017-01-05 IMAGING — MR MR HEAD W/O CM
8 of 10 series · 36 of 48 positions shown · non-contrast
Comparison: Prior CT earlier the same day.

CLINICAL DATA: Initial evaluation for 2 day history of left-sided
weakness.

EXAM:
MRI HEAD WITHOUT CONTRAST
TECHNIQUE: Multiplanar, multiecho pulse sequences of the brain and surrounding
structures were obtained without intravenous contrast.

[Series 4: DWI · axial · 3.6mm · 0.94mm/px · z∈[-85,+66]mm · 8 of 86 slices shown (1 of 4)]
[im 1/86]
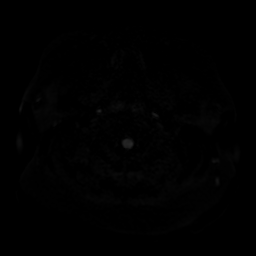
[im 10/86]
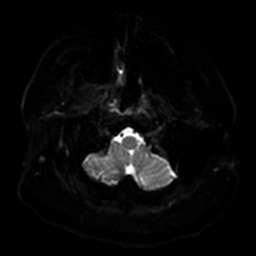
[im 29/86]
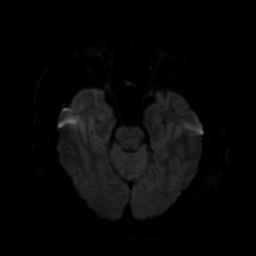
[im 38/86]
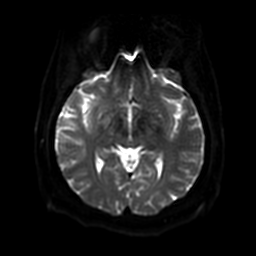
[im 48/86]
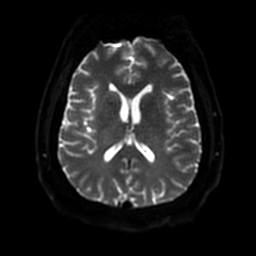
[im 57/86]
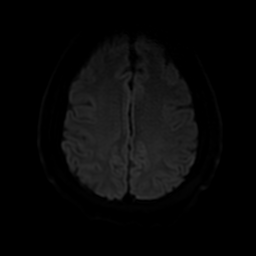
[im 76/86]
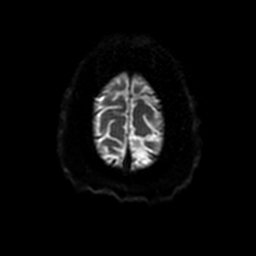
[im 86/86]
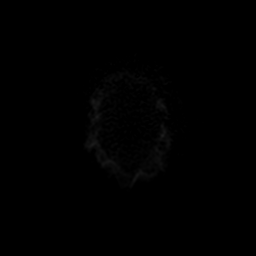

[Series 6: T2 · axial · 5.0mm · 0.43mm/px · z∈[-61,+76]mm · 3 of 24 slices shown (1 of 2)]
[im 1/24]
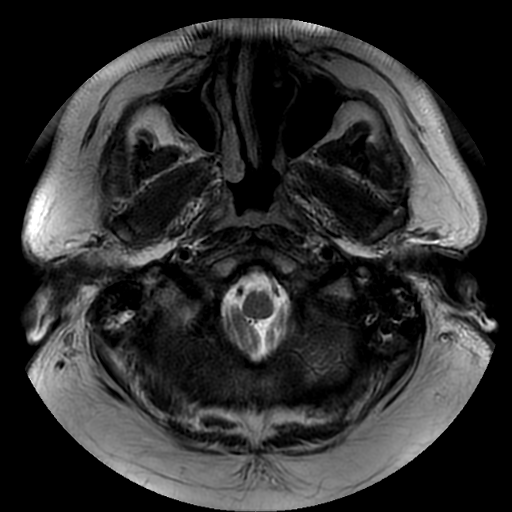
[im 12/24]
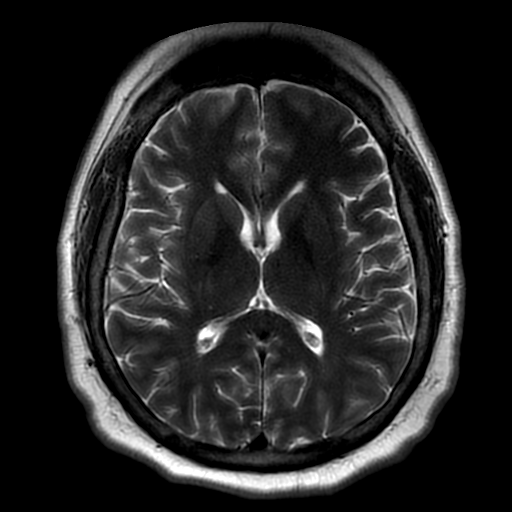
[im 24/24]
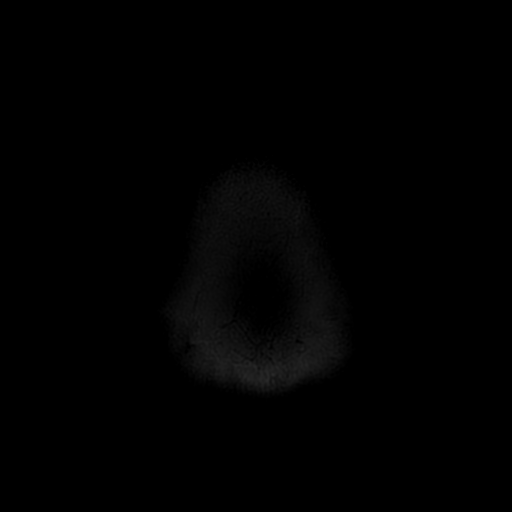

[Series 7: FLAIR · axial · 5.0mm · 0.43mm/px · z∈[-70,+74]mm · 3 of 25 slices shown (1 of 2)]
[im 1/25]
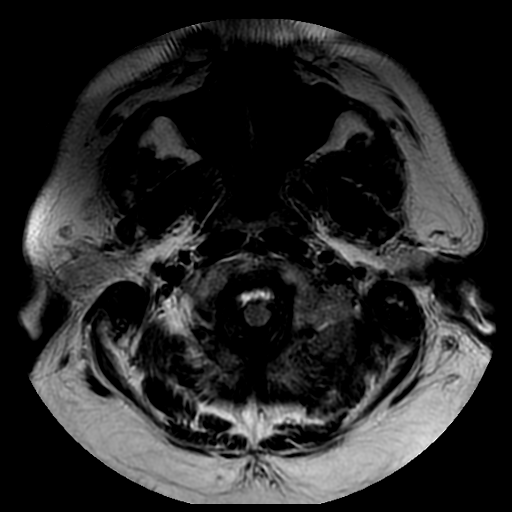
[im 13/25]
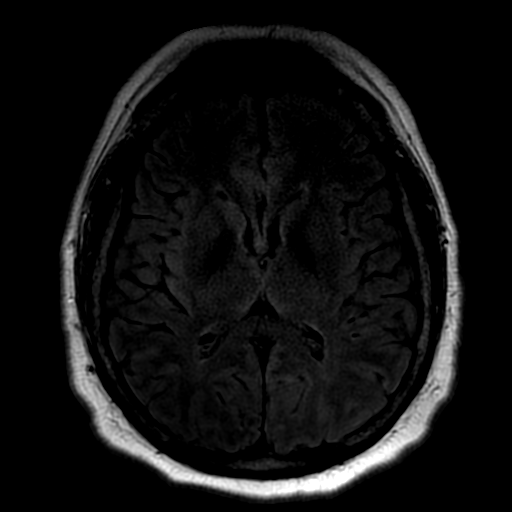
[im 25/25]
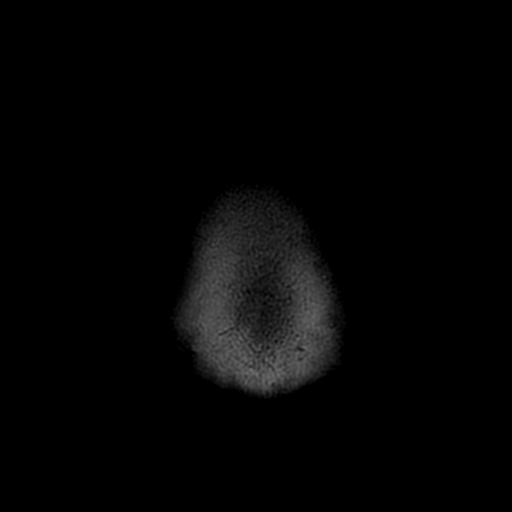

[Series 9: DWI · coronal · 5.0mm · 1.02mm/px · 8 of 66 slices shown (2 of 4)]
[im 1/66]
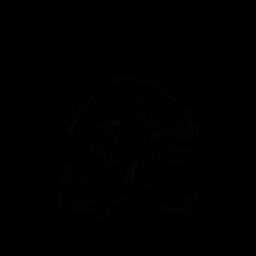
[im 10/66]
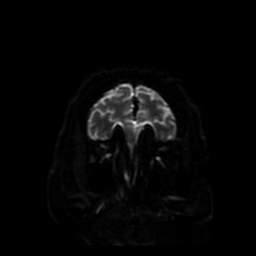
[im 19/66]
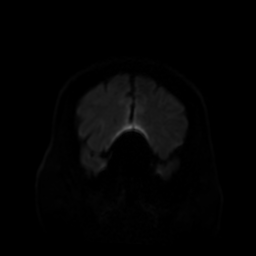
[im 28/66]
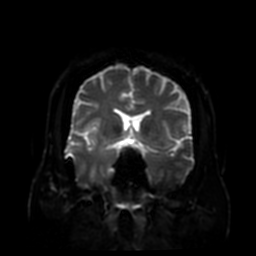
[im 38/66]
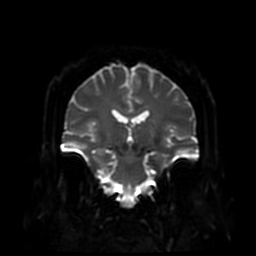
[im 47/66]
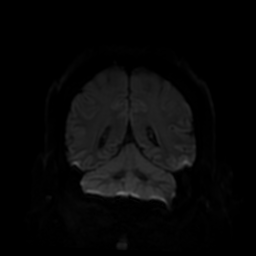
[im 56/66]
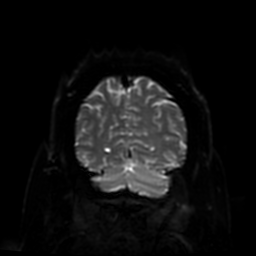
[im 66/66]
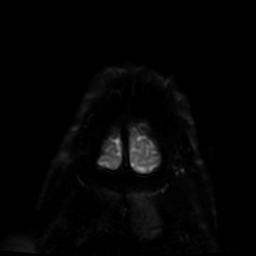

[Series 11: T2 · coronal · 5.0mm · 0.47mm/px · 2 of 28 slices shown (2 of 2)]
[im 1/28]
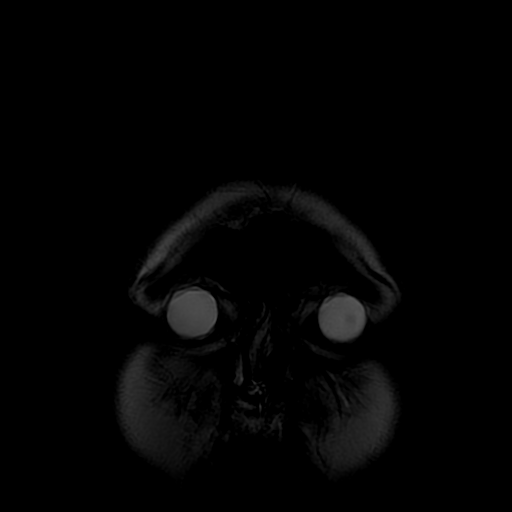
[im 14/28]
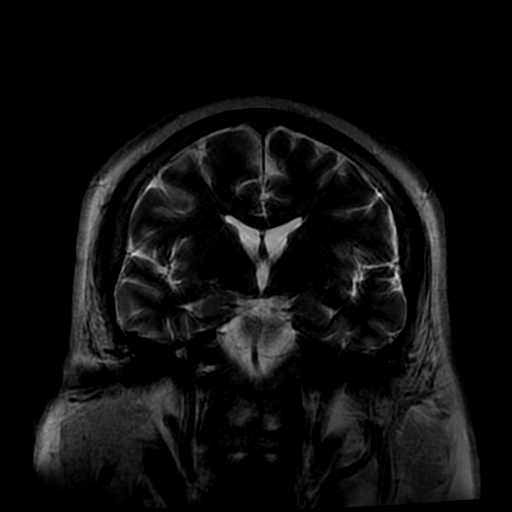

[Series 12: FLAIR · sagittal · 5.0mm · 0.47mm/px · 3 of 25 slices shown (2 of 2)]
[im 1/25]
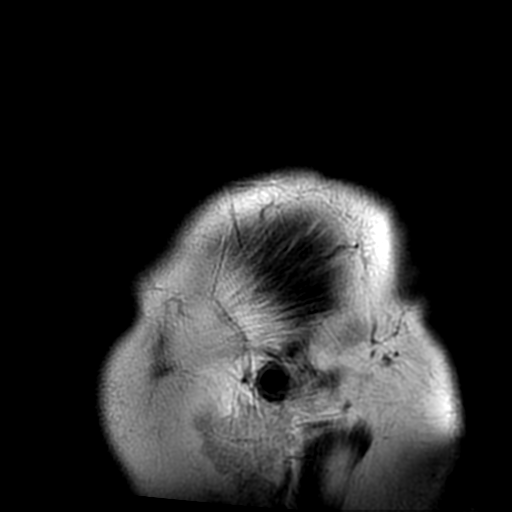
[im 13/25]
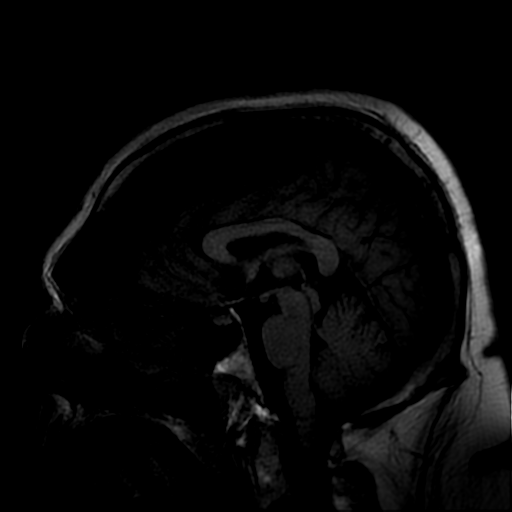
[im 25/25]
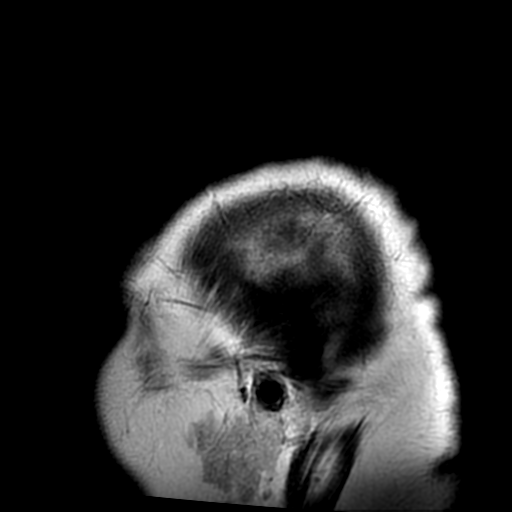

[Series 400: DWI · axial · 3.6mm · 0.94mm/px · z∈[-81,+66]mm · 5 of 42 slices shown (3 of 4)]
[im 1/42]
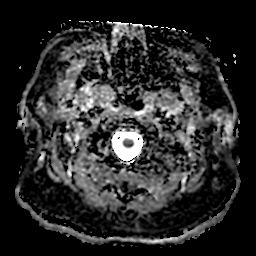
[im 11/42]
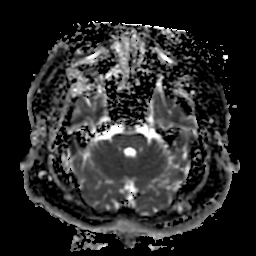
[im 21/42]
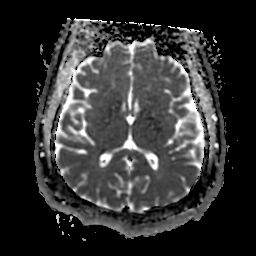
[im 31/42]
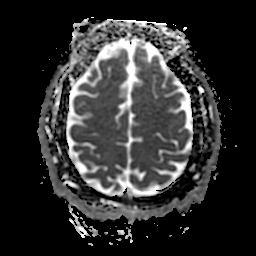
[im 42/42]
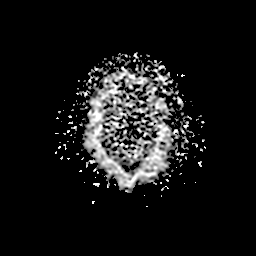

[Series 900: DWI · coronal · 5.0mm · 1.02mm/px · 4 of 33 slices shown (4 of 4)]
[im 1/33]
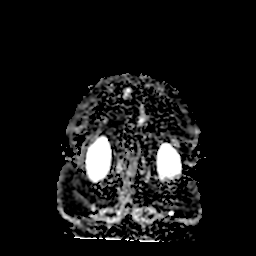
[im 11/33]
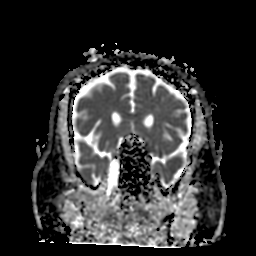
[im 22/33]
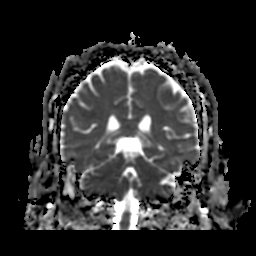
[im 33/33]
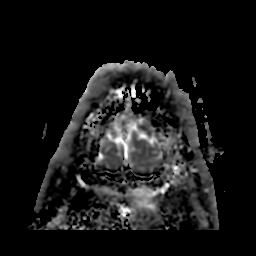

[36 of 48 positions shown; findings below may reference images not displayed]

FINDINGS: Study mildly degraded by motion artifact.

Cerebral volume within normal limits for patient age. No significant
white matter disease present.

No abnormal foci of restricted diffusion to suggest acute
intracranial infarct. Gray-white matter differentiation maintained.
Normal intravascular flow voids preserved. No acute or chronic
intracranial hemorrhage. No areas of chronic infarction.

No mass lesion, midline shift, or mass effect. No hydrocephalus. No
extra-axial fluid collection.

Craniocervical junction within normal limits. Partially visualized
upper cervical spine unremarkable.

Pituitary gland within normal limits. No acute abnormality about the
orbits.

Mild opacity within the partially visualized right sphenoid sinus.
Paranasal sinuses are otherwise largely clear. Scattered opacity
within the bilateral mastoid air cells, right greater than left,
likely benign. Inner ear structures within normal limits.

Bone marrow signal intensity within normal limits. Scalp soft
tissues demonstrate no acute abnormality.
IMPRESSION: Negative MRI of the brain. No acute intracranial infarct or other
process identified.

## 2017-01-06 IMAGING — CR DG ABDOMEN ACUTE W/ 1V CHEST
4 series · 4 of 4 positions shown · non-contrast
Comparison: Radiograph dated 04/04/2015

CLINICAL DATA: 39-year-old male with recent battery ingestion.

EXAM:
DG ABDOMEN ACUTE W/ 1V CHEST

[w chest pa]
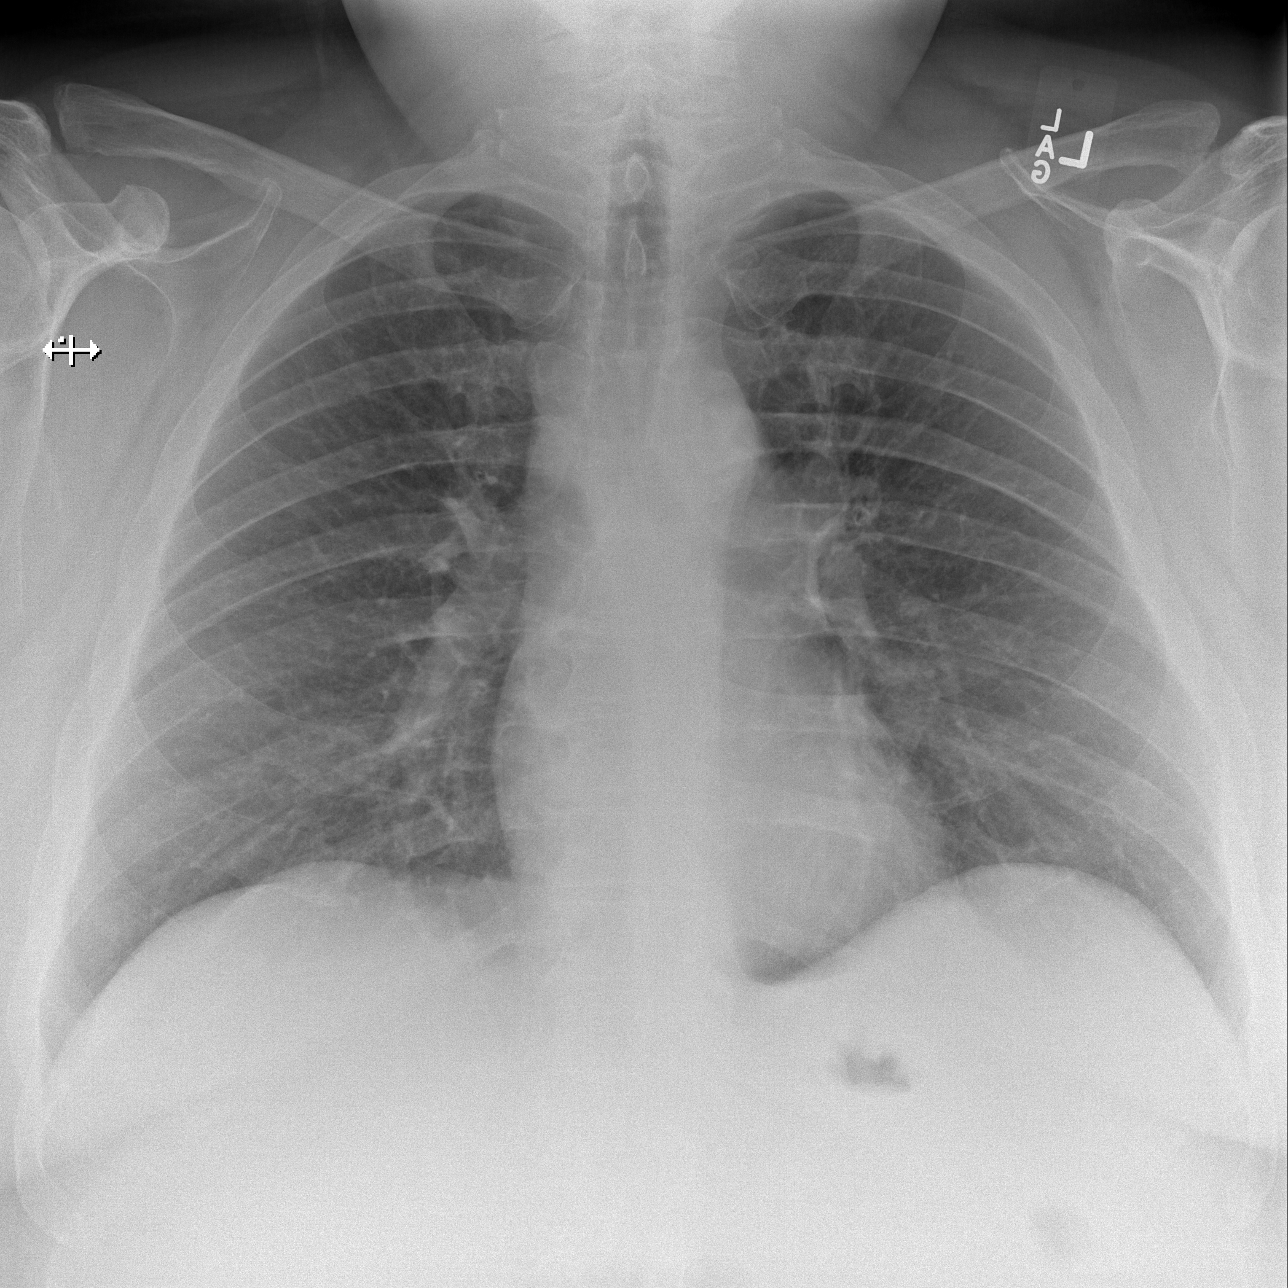

[w abdomen upright]
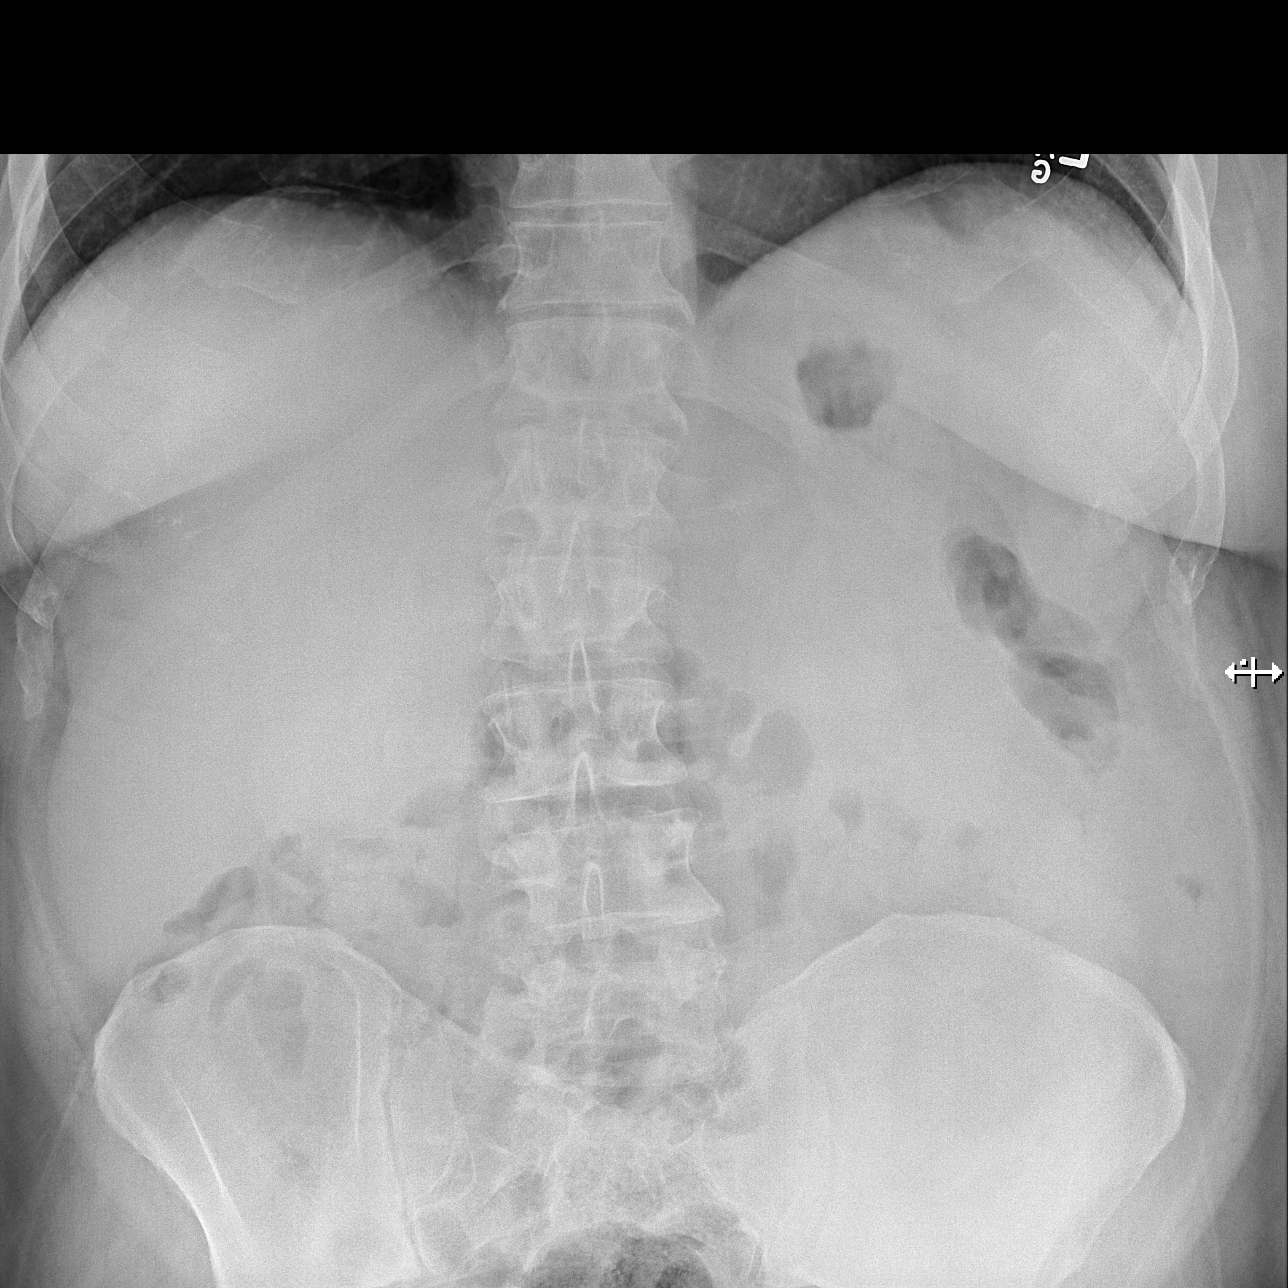

[t abdomen supine (1 of 2)]
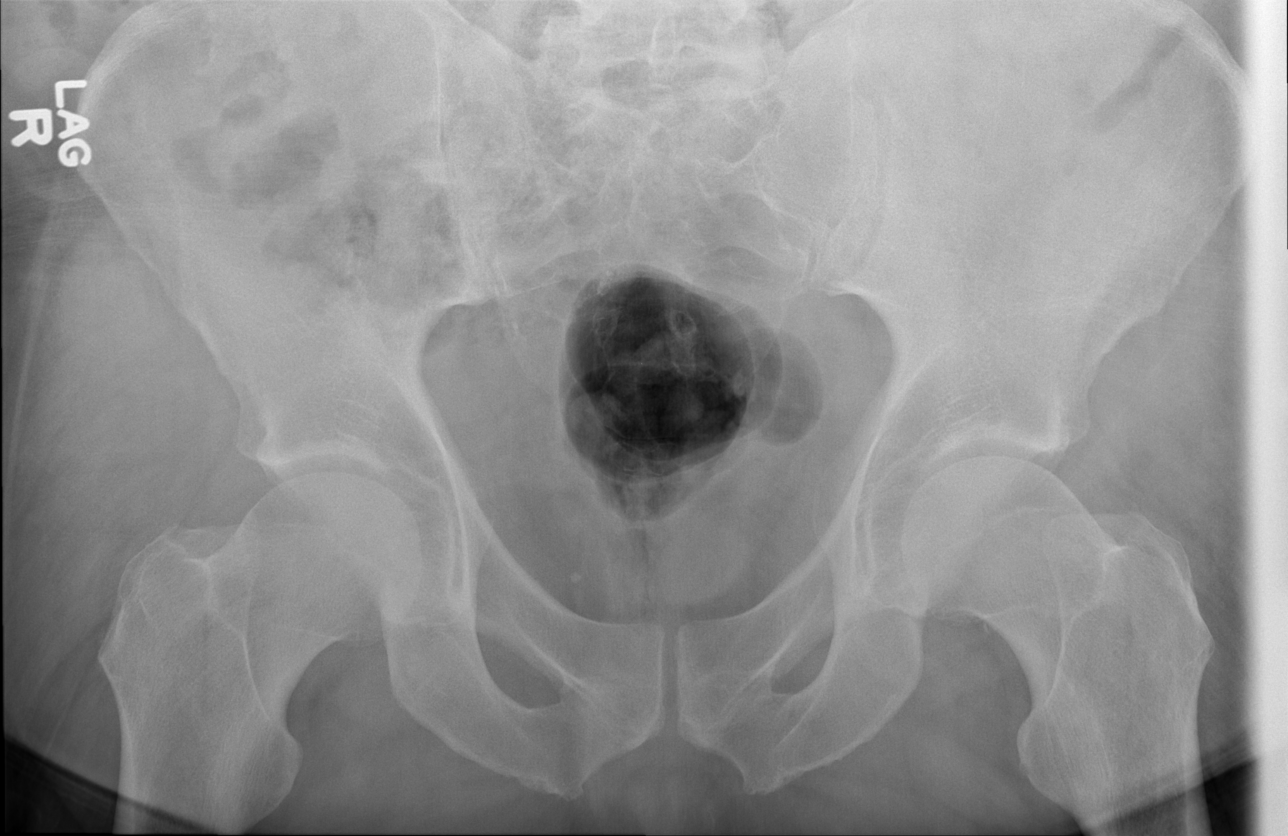

[t abdomen supine (2 of 2)]
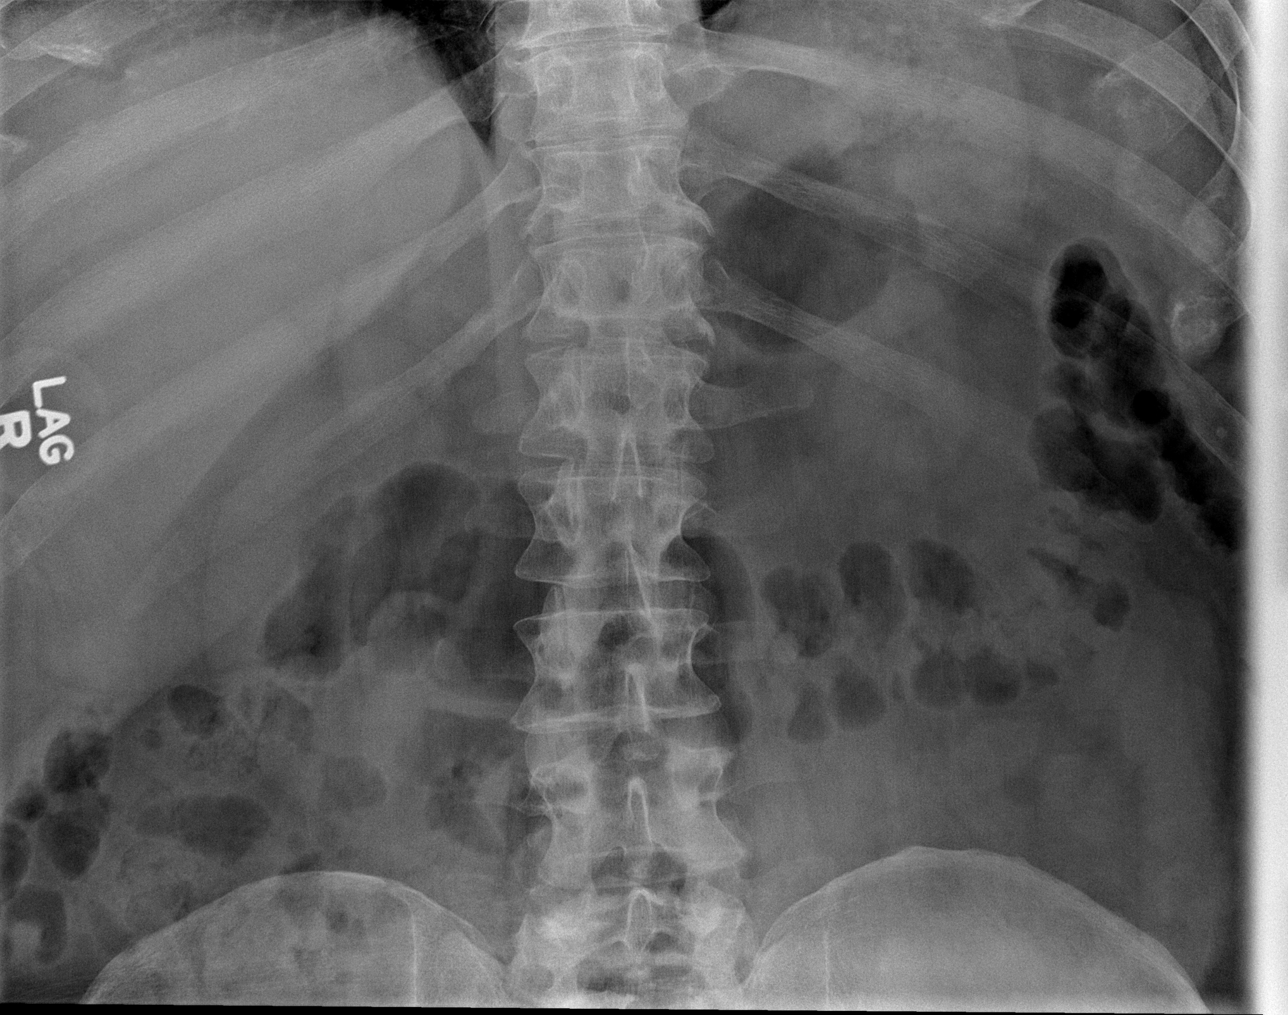

[4 of 4 positions shown; findings below may reference images not displayed]

FINDINGS: The radiopaque density noted in the prior study is not visualized on
the current exam. No radiopaque foreign object identified. There is
no bowel obstruction. No free air. No radiopaque calculi.

The lungs are clear. No pleural effusion or pneumothorax the cardiac
silhouette is within normal limits. The osseous structures appear
unremarkable.
IMPRESSION: The previously seen battery is not visualized on this study. No
radiopaque foreign object. No evidence of bowel obstruction.

## 2017-01-07 ENCOUNTER — Encounter (HOSPITAL_COMMUNITY): Payer: Self-pay | Admitting: Emergency Medicine

## 2017-01-07 ENCOUNTER — Emergency Department (HOSPITAL_COMMUNITY)
Admission: EM | Admit: 2017-01-07 | Discharge: 2017-01-08 | Disposition: A | Discharge status: Home / Self Care | Payer: Medicare Other | Source: Home / Self Care | Attending: Emergency Medicine | Admitting: Emergency Medicine

## 2017-01-07 ENCOUNTER — Other Ambulatory Visit: Payer: Self-pay

## 2017-01-07 ENCOUNTER — Emergency Department (HOSPITAL_COMMUNITY): Payer: Medicare Other

## 2017-01-07 DIAGNOSIS — M79672 Pain in left foot: Secondary | ICD-10-CM | POA: Diagnosis not present

## 2017-01-07 DIAGNOSIS — L03119 Cellulitis of unspecified part of limb: Secondary | ICD-10-CM

## 2017-01-07 DIAGNOSIS — S90852A Superficial foreign body, left foot, initial encounter: Secondary | ICD-10-CM

## 2017-01-07 DIAGNOSIS — E11621 Type 2 diabetes mellitus with foot ulcer: Secondary | ICD-10-CM | POA: Diagnosis not present

## 2017-01-07 MED ORDER — SULFAMETHOXAZOLE-TRIMETHOPRIM 800-160 MG PO TABS
1.0000 | ORAL_TABLET | Freq: Once | ORAL | Status: AC
  Administered 2017-01-07: 1 via ORAL
  Filled 2017-01-07: qty 1

## 2017-01-07 MED ORDER — CEPHALEXIN 500 MG PO CAPS: 500.0000 mg | ORAL_CAPSULE | Freq: Three times a day (TID) | ORAL | 0 refills | Status: DC

## 2017-01-07 MED ORDER — CEPHALEXIN 500 MG PO CAPS
500.0000 mg | ORAL_CAPSULE | Freq: Once | ORAL | Status: AC
  Administered 2017-01-07: 500 mg via ORAL
  Filled 2017-01-07: qty 1

## 2017-01-07 MED ORDER — SULFAMETHOXAZOLE-TRIMETHOPRIM 800-160 MG PO TABS: 1.0000 | ORAL_TABLET | Freq: Two times a day (BID) | ORAL | 0 refills | Status: DC

## 2017-01-07 NOTE — ED Notes (Signed)
Awaiting reassessment.  

## 2017-01-07 NOTE — ED Notes (Signed)
L foot is cleansed and soaked in NS and soap

## 2017-01-07 NOTE — Discharge Instructions (Addendum)
There does appear to be a small foreign body near your heel. It may be a tiny piece of glass. I think the source of your infection is actually from the wound on the side of your foot though. You are being started on antibiotics. Take them until finished. You need to keep your feet clean and dry. If you do start to get better or your symptoms worsen you need to be rechecked.

## 2017-01-07 NOTE — ED Notes (Addendum)
Pt has an avulsion to his lateral L foot- Pt is extremely dirty and odoriferous- His L Croc is pulled off of his foot due to being stuck to his wound  Pt clothing is filthy and he is unkempt in spite of reporting he cleaned his L foot today

## 2017-01-07 NOTE — ED Triage Notes (Signed)
Pt c/o left foot pain after stepping on something 2 days ago. Pt has a lot of facial movement he states it is because of the pain. Pt also states he takes adderall.

## 2017-01-07 NOTE — ED Provider Notes (Signed)
[facility] Provider Note   CSN: [id number] Arrival date & time: 01/07/17  2011     History   Chief Complaint Chief Complaint  Patient presents with  . Foot Pain    HPI Bryan Gallegos is a 41 y.o. male.  HPI   41 year old male with left foot pain and swelling.  Patient feels like he stepped on something several days ago.  He is unsure what that might been no.  He has had increasing foot pain and swelling since then.  Past history of IV drug use but denies recent usage.  No fevers or chills.  Past Medical History:  Diagnosis Date  . Adult ADHD   . Anaphylactic reaction   . Anxiety   . Asthma   . Bipolar disorder (HCC)   . Complication of anesthesia    Per patient difficult intubation;  . Diabetes mellitus   . Difficult intubation    Per patient  . Drug overdose   . Heart valve disorder    s/p echocardiogram  . Hyperlipidemia   . Hypertension   . Morbidly obese (HCC)   . OSA (obstructive sleep apnea)   . Polysubstance abuse (HCC)   . Transient cerebral ischemia    Unknown    Patient Active Problem List   Diagnosis Date Noted  . Hypotension 11/11/2016  . Suicide attempt (HCC) 10/17/2016  . Acute and chronic respiratory failure with hypercapnia (HCC)   . Pulmonary edema   . Syncope   . Respiratory failure (HCC) 01/04/2016  . Drug overdose   . Acute encephalopathy 01/01/2016  . Hyponatremia 01/01/2016  . Chronic pain syndrome 10/22/2015  . Healthcare maintenance 10/22/2015  . Hoarse voice quality 10/11/2015  . Major depressive disorder, recurrent severe without psychotic features (HCC) 09/04/2015  . Polysubstance dependence including opioid type drug, episodic abuse (HCC) 09/04/2015  . RUQ abdominal pain   . Cholelithiasis 05/17/2015  . Antisocial personality disorder (HCC) 04/11/2015  . Opioid use disorder, severe, dependence (HCC) 04/11/2015  . Benzodiazepine dependence (HCC) 04/11/2015  . Tobacco use disorder 04/10/2015  .  Narcotic dependency, continuous (HCC) 03/14/2014  . Chronic back pain greater than 3 months duration 01/28/2014  . Gastroesophageal reflux disease with esophagitis 01/28/2014  . Essential hypertension, benign 01/28/2014  . Diabetes mellitus type II, controlled (HCC) 01/28/2014  . Morbid obesity (HCC) 01/28/2014  . TIA (transient ischemic attack) 09/02/2013  . HLD (hyperlipidemia) 08/06/2013  . OSA on CPAP 08/06/2013    Past Surgical History:  Procedure Laterality Date  . CARPAL TUNNEL RELEASE    . NOSE SURGERY    . TOOTH EXTRACTION    . WISDOM TOOTH EXTRACTION         Home Medications    Prior to Admission medications   Medication Sig Start Date End Date Taking? Authorizing Provider  albuterol (PROVENTIL HFA;VENTOLIN HFA) 108 (90 Base) MCG/ACT inhaler Inhale 1-2 puffs into the lungs every 6 (six) hours as needed for wheezing or shortness of breath. 11/20/15   Marlon PelGreene, Tiffany, PA-C  alprazolam Prudy Feeler(XANAX) 2 MG tablet Take 2 mg by mouth 3 (three) times daily.    [provider]  amphetamine-dextroamphetamine (ADDERALL) 30 MG tablet Take 30-60 mg by mouth 2 (two) times daily. Pt takes two tablets in the morning and one in the evening.    [provider]  aspirin 325 MG tablet Take 1 tablet (325 mg total) by mouth daily. For heart health 09/05/15   Armandina StammerNwoko, Agnes I, NP  atorvastatin (LIPITOR) 20  MG tablet Take 20 mg by mouth at bedtime.    [provider]  citalopram (CELEXA) 20 MG tablet Take 1 tablet (20 mg total) by mouth every morning. 10/15/15   Storm FriskWright, Patrick E, MD  cloNIDine (CATAPRES) 0.2 MG tablet Take 0.2 mg by mouth 2 (two) times daily.    [provider]  COMBIVENT RESPIMAT 20-100 MCG/ACT AERS respimat Take 1 puff by mouth every 6 (six) hours as needed for wheezing or shortness of breath.     [provider]  divalproex (DEPAKOTE) 500 MG DR tablet TAKE 1 TABLET BY MOUTH 2 TIMES DAILY. 03/03/16   Quentin AngstJegede, Olugbemiga E, MD  DULoxetine  (CYMBALTA) 30 MG capsule Take 60 mg by mouth daily.     [provider]  DULoxetine (CYMBALTA) 60 MG capsule Take 60 mg by mouth daily. 10/25/16   [provider]  Fluticasone-Salmeterol (ADVAIR) 250-50 MCG/DOSE AEPB Inhale 1 puff into the lungs 2 (two) times daily.    [provider]  furosemide (LASIX) 40 MG tablet Take 40 mg by mouth daily.    [provider]  gabapentin (NEURONTIN) 400 MG capsule Take 800 mg by mouth 3 (three) times daily.    [provider]  metFORMIN (GLUCOPHAGE) 1000 MG tablet Take 1 tablet (1,000 mg total) by mouth 2 (two) times daily. 10/15/15   Storm FriskWright, Patrick E, MD  metoprolol tartrate (LOPRESSOR) 50 MG tablet Take 50 mg by mouth 2 (two) times daily.    [provider]  omeprazole (PRILOSEC) 40 MG capsule Take 1 capsule (40 mg total) by mouth 2 (two) times daily. 10/15/15   Storm FriskWright, Patrick E, MD  oxycodone (ROXICODONE) 30 MG immediate release tablet Take 30 mg by mouth 5 (five) times daily as needed for pain.    [provider]  QUEtiapine (SEROQUEL) 100 MG tablet Take 100-300 mg by mouth 3 (three) times daily. Take 1 tablet in the morning, take 3 tablets in the evening, and take 2 tablets at bedtime 10/25/16   [provider]  ranitidine (ZANTAC) 150 MG tablet Take 150 mg by mouth 2 (two) times daily.     [provider]  sitaGLIPtin (JANUVIA) 100 MG tablet Take 100 mg by mouth daily.    [provider]  testosterone cypionate (DEPOTESTOSTERONE CYPIONATE) 200 MG/ML injection Inject 200 mg into the muscle every 30 (thirty) days.     [provider]  tiZANidine (ZANAFLEX) 4 MG tablet Take 4 mg by mouth 3 (three) times daily as needed for muscle spasms.     [provider]  traZODone (DESYREL) 50 MG tablet Take 1 tablet (50 mg total) by mouth at bedtime. For sleep Patient taking differently: Take 150 mg by mouth at bedtime. For sleep 10/15/15   Storm FriskWright, Patrick E, MD    ursodiol (ACTIGALL) 300 MG capsule Take 300 mg by mouth 2 (two) times daily.    [provider]  valsartan (DIOVAN) 160 MG tablet Take 160 mg by mouth daily.    [provider]    Family History Family History  Problem Relation Age of Onset  . CAD Mother        Living  . Diabetes Mellitus II Mother   . Stroke Mother   . Hypertension Mother   . Congestive Heart Failure Mother   . Kidney disease Mother   . Fibromyalgia Mother   . Thyroid disease Mother   . Hyperlipidemia Mother   . Liver disease Mother   . Alcoholism Father  35       Deceased  . Arthritis Maternal Grandmother   . Congestive Heart Failure Maternal Grandmother   . Hypertension Maternal Grandmother   . Lung cancer Maternal Grandfather   . Colon cancer Maternal Aunt   . Stomach cancer Maternal Aunt   . Heart disease Other        Paternal & Maternal  . Crohn's disease Other   . Hypertension Other        Paternal & Maternal  . Hypertension Brother        x3  . Hypertension Sister        #1  . Bipolar disorder Sister        #1  . ADD / ADHD Son        x3  . Bipolar disorder Son        x3  . Asperger's syndrome Son     Social History Social History   Tobacco Use  . Smoking status: Current Every Day Smoker    Packs/day: 0.50    Years: 23.00    Pack years: 11.50    Types: Cigarettes  . Smokeless tobacco: Never Used  Substance Use Topics  . Alcohol use: No    Alcohol/week: 0.0 oz  . Drug use: No     Allergies   Ace inhibitors; Atenolol; Lisinopril; Prednisone; Ibuprofen; and Tylenol [acetaminophen]   Review of Systems Review of Systems  All systems reviewed and negative, other than as noted in HPI.  Physical Exam Updated Vital Signs BP (!) 147/71   Pulse (!) 107   Temp 99.8 F (37.7 C) (Oral)   Resp 20   Ht 6\' 2"  (1.88 m)   Wt (!) 158.8 kg (350 lb)   SpO2 97%   BMI 44.94 kg/m   Physical Exam  Constitutional: He appears well-developed and well-nourished. No  distress.  HENT:  Head: Normocephalic and atraumatic.  Eyes: Conjunctivae are normal. Right eye exhibits no discharge. Left eye exhibits no discharge.  Neck: Neck supple.  Cardiovascular: Normal rate, regular rhythm and normal heart sounds. Exam reveals no gallop and no friction rub.  No murmur heard. Pulmonary/Chest: Effort normal and breath sounds normal. No respiratory distress.  Abdominal: Soft. He exhibits no distension. There is no tenderness.  Musculoskeletal:  Patient is a very disheveled appearance and obviously does not take good care of his feet.  His footwear is actually stuck to his foot when he arrived.  Is got tenderness to palpation and mild swelling of the plantar forefoot.  no drainage.  Neurological: He is alert.  Speech clear.  Content appropriate.  Facial tics.  Skin: Skin is warm and dry.  Innumerable skin lesions in various stages of healing.  Psychiatric: He has a normal mood and affect. His behavior is normal. Thought content normal.  Nursing note and vitals reviewed.    ED Treatments / Results  Labs (all labs ordered are listed, but only abnormal results are displayed) Labs Reviewed - No data to display  EKG  EKG Interpretation None       Radiology No results found.  Procedures Procedures (including critical care time)  Medications Ordered in ED Medications - No data to display   Initial Impression / Assessment and Plan / ED Course  I have reviewed the triage vital signs and the nursing notes.  Pertinent labs & imaging results that were available during my care of the patient were reviewed by me and considered in my medical decision making (see chart  for details).     37-year-old male with most likely cellulitis of his foot.  X-ray significant for possible foreign body but this location does not correlate to symptomatology.  Most of his pain and swelling is actually in forefoot.  He is got multiple skin lesions all over his body.  We will  start him on Keflex.  If his symptoms do not improve he does need repeat evaluation/orthopedic evaluation.  Final Clinical Impressions(s) / ED Diagnoses   Final diagnoses:  None    ED Discharge Orders    None       Raeford Razor, MD 01/18/17 [phone]

## 2017-01-08 IMAGING — CT CT HEAD W/O CM
2 series · 17 of 30 positions shown, 20 images · non-contrast
Comparison: Head CT dated 08/18/2014 and brain MRI dated
04/08/2015.

CLINICAL DATA: Altered mental status, suspected OD, apneic period.

EXAM:
CT HEAD WITHOUT CONTRAST
TECHNIQUE: Contiguous axial images were obtained from the base of the skull
through the vertex without intravenous contrast.

[Series 2: head w/o · axial · non-contrast · 0.45mm/px · z∈[-218,-83]mm · 9 of 35 slices shown, 12 images]
[im 4/35  brain]
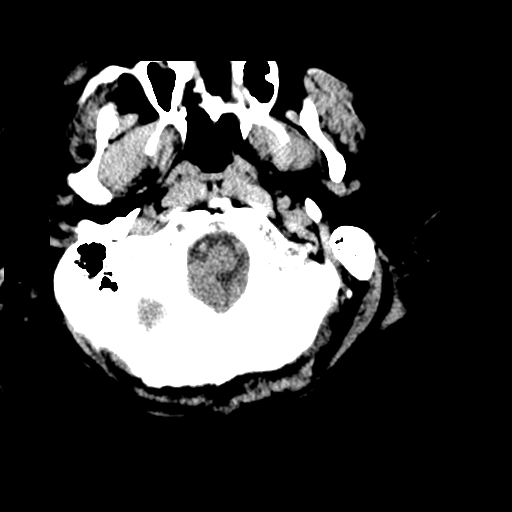
[im 4/35  bone]
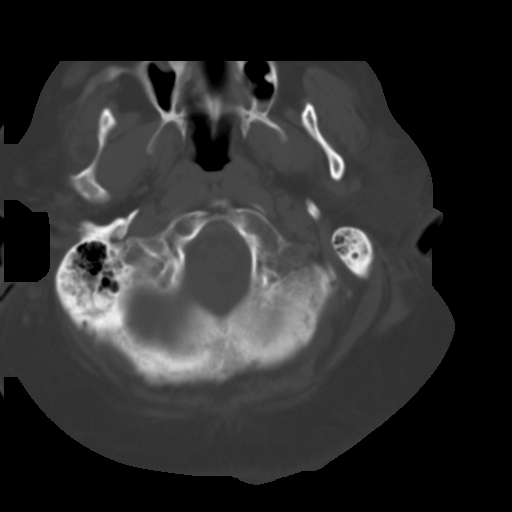
[im 7/35  brain]
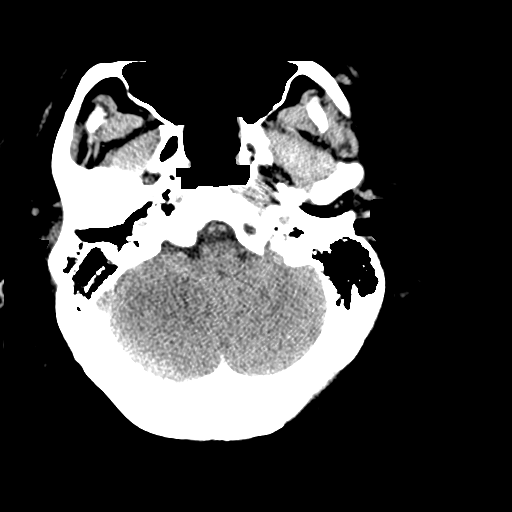
[im 11/35  brain]
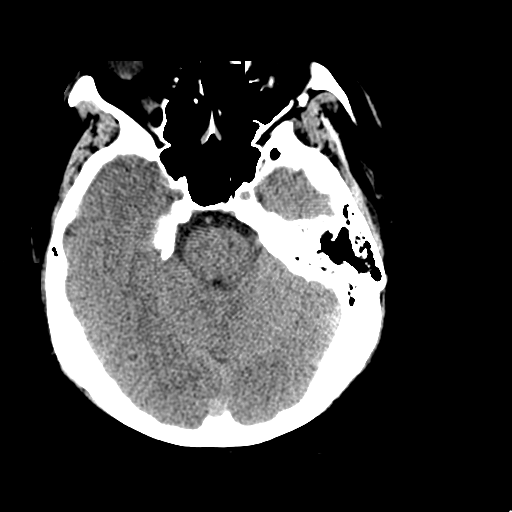
[im 14/35  brain]
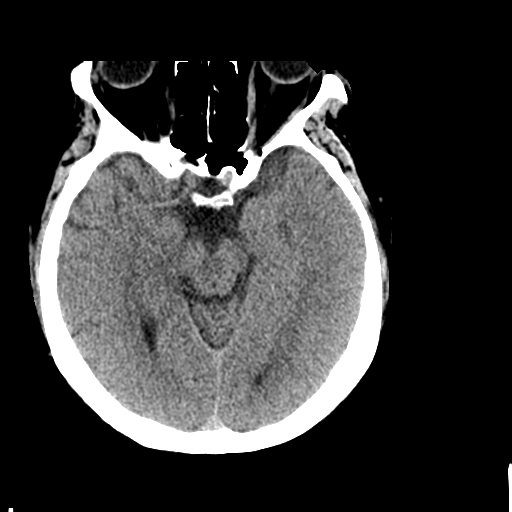
[im 18/35  brain]
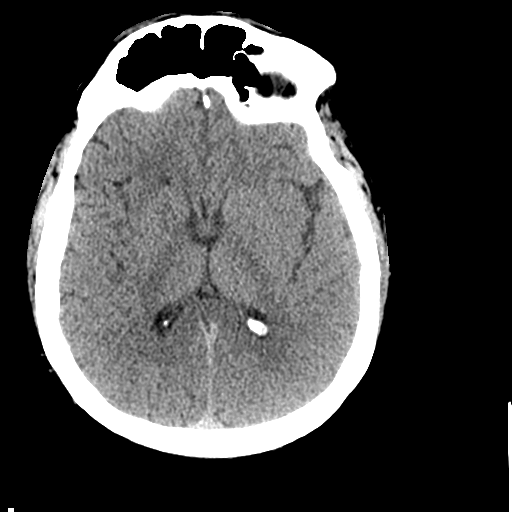
[im 18/35  bone]
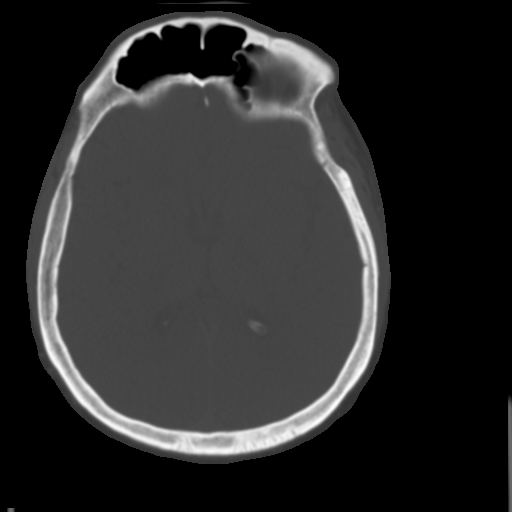
[im 21/35  brain]
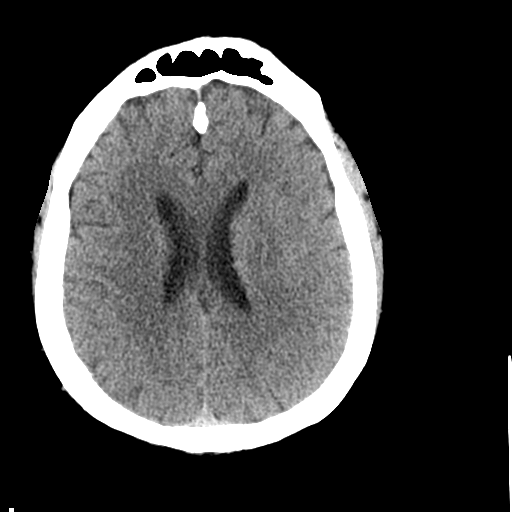
[im 24/35  brain]
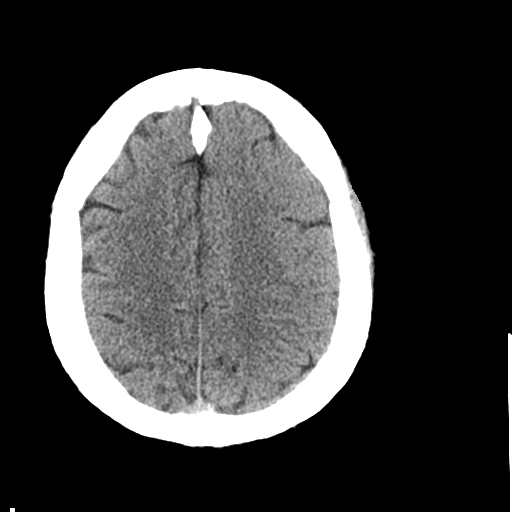
[im 28/35  brain]
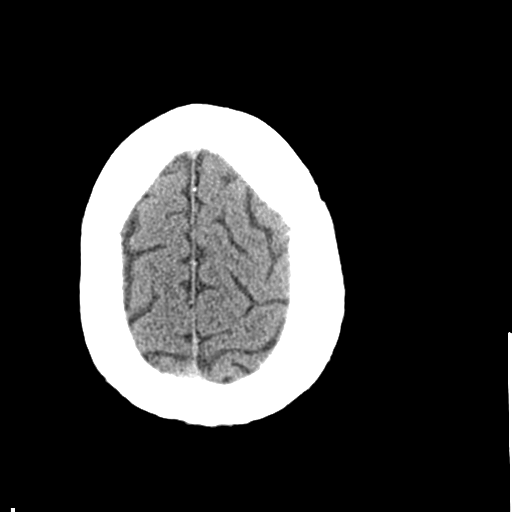
[im 31/35  brain]
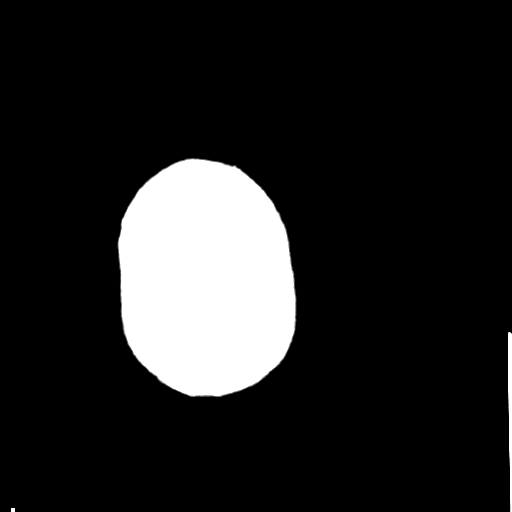
[im 31/35  bone]
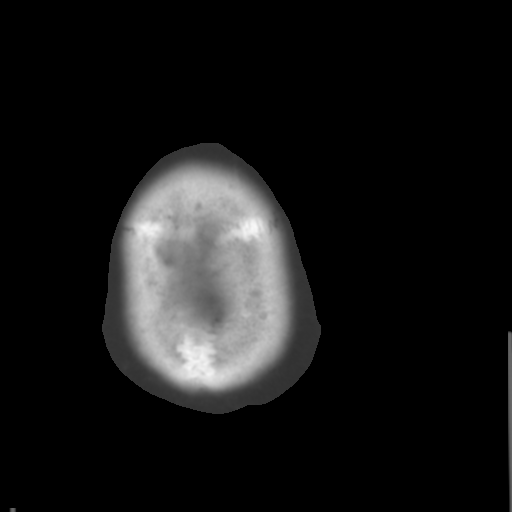

[Series 3: bone windows · axial · 0.45mm/px · z∈[-215,-83]mm · 8 of 58 slices shown]
[im 7/58  bone]
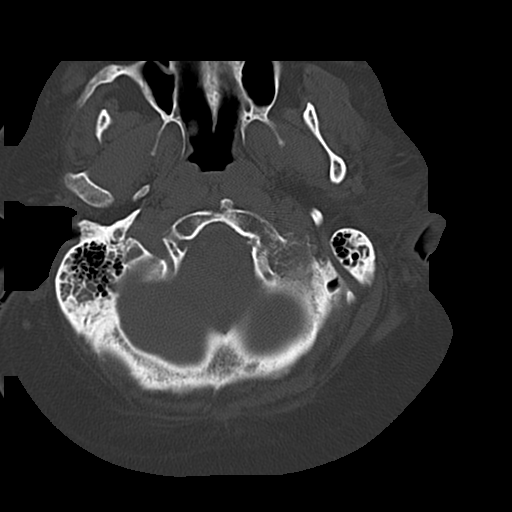
[im 13/58  bone]
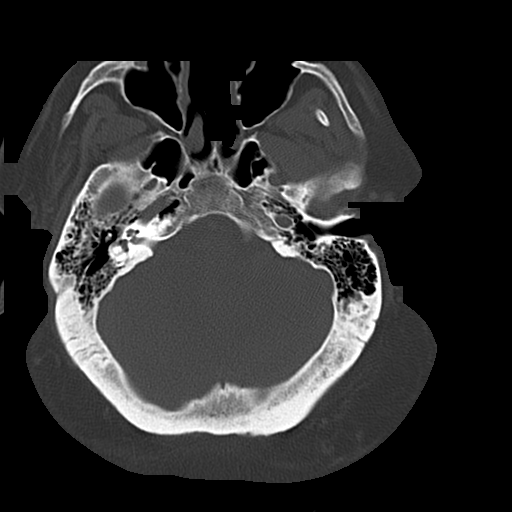
[im 20/58  bone]
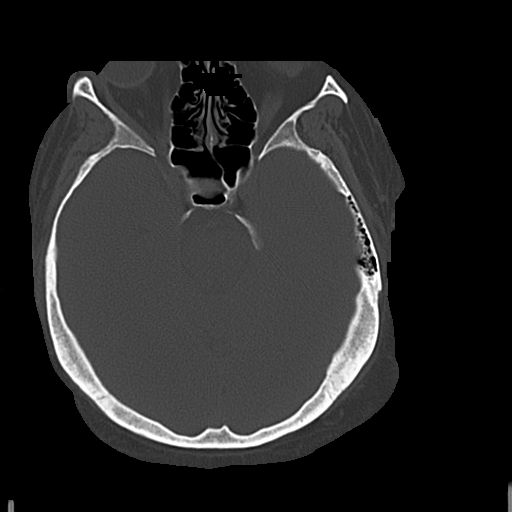
[im 26/58  bone]
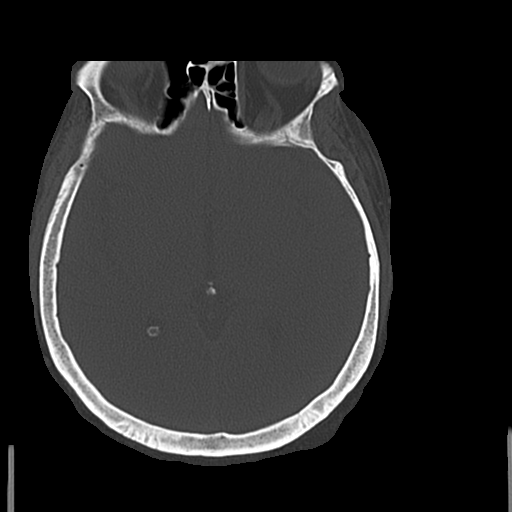
[im 32/58  bone]
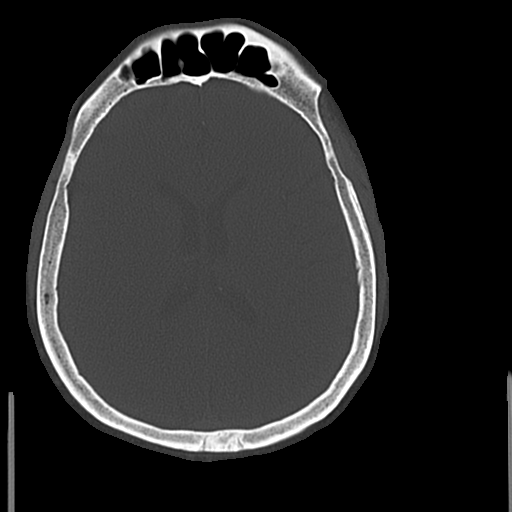
[im 39/58  bone]
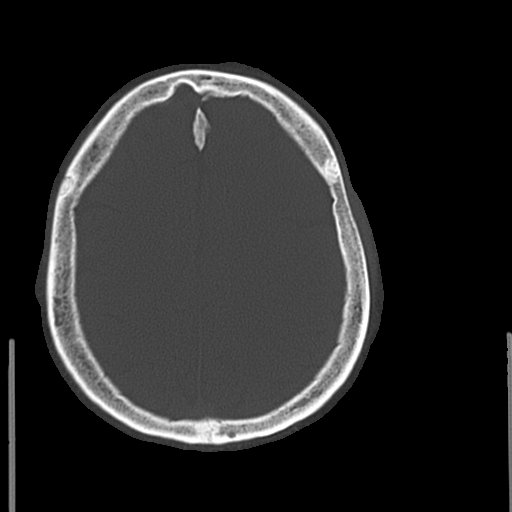
[im 45/58  bone]
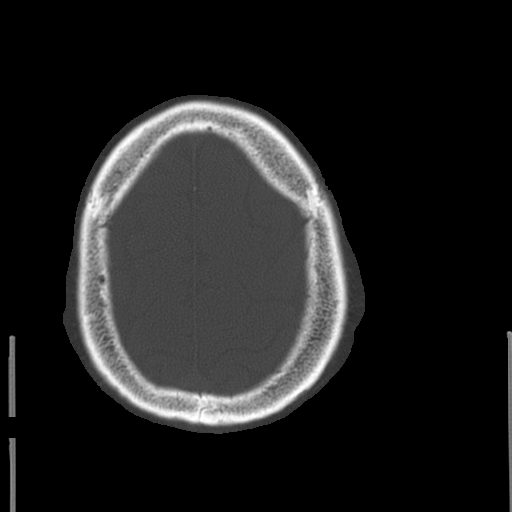
[im 51/58  bone]
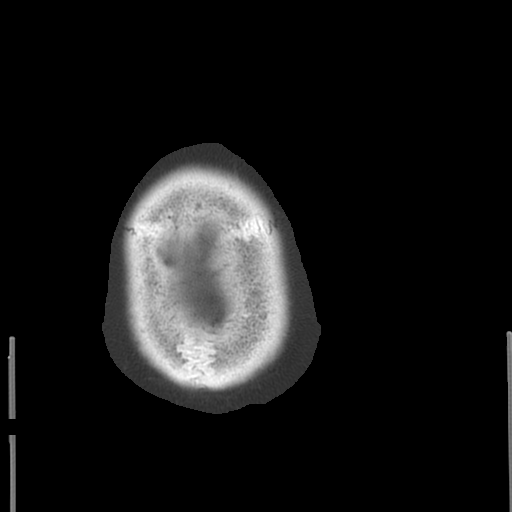

[17 of 30 positions shown; findings below may reference images not displayed]

FINDINGS: Ventricles are normal in size and configuration. There is mild
generalized brain atrophy with commensurate dilatation of the sulci.

There is no mass, hemorrhage, edema or other evidence of acute
parenchymal abnormality. No extra-axial hemorrhage. No osseous
fracture or dislocation. Paranasal sinuses and mastoid air cells are
clear. Superficial soft tissues are unremarkable.
IMPRESSION: Negative head CT.  No intracranial mass, hemorrhage or edema.

## 2017-01-08 IMAGING — DX DG CHEST 1V PORT
2 series · 2 of 2 positions shown · non-contrast
Comparison: 04/09/2015 and earlier.

CLINICAL DATA: 39-year-old found unresponsive earlier today.

EXAM:
PORTABLE CHEST 1 VIEW

[chest ap (1 of 2)]
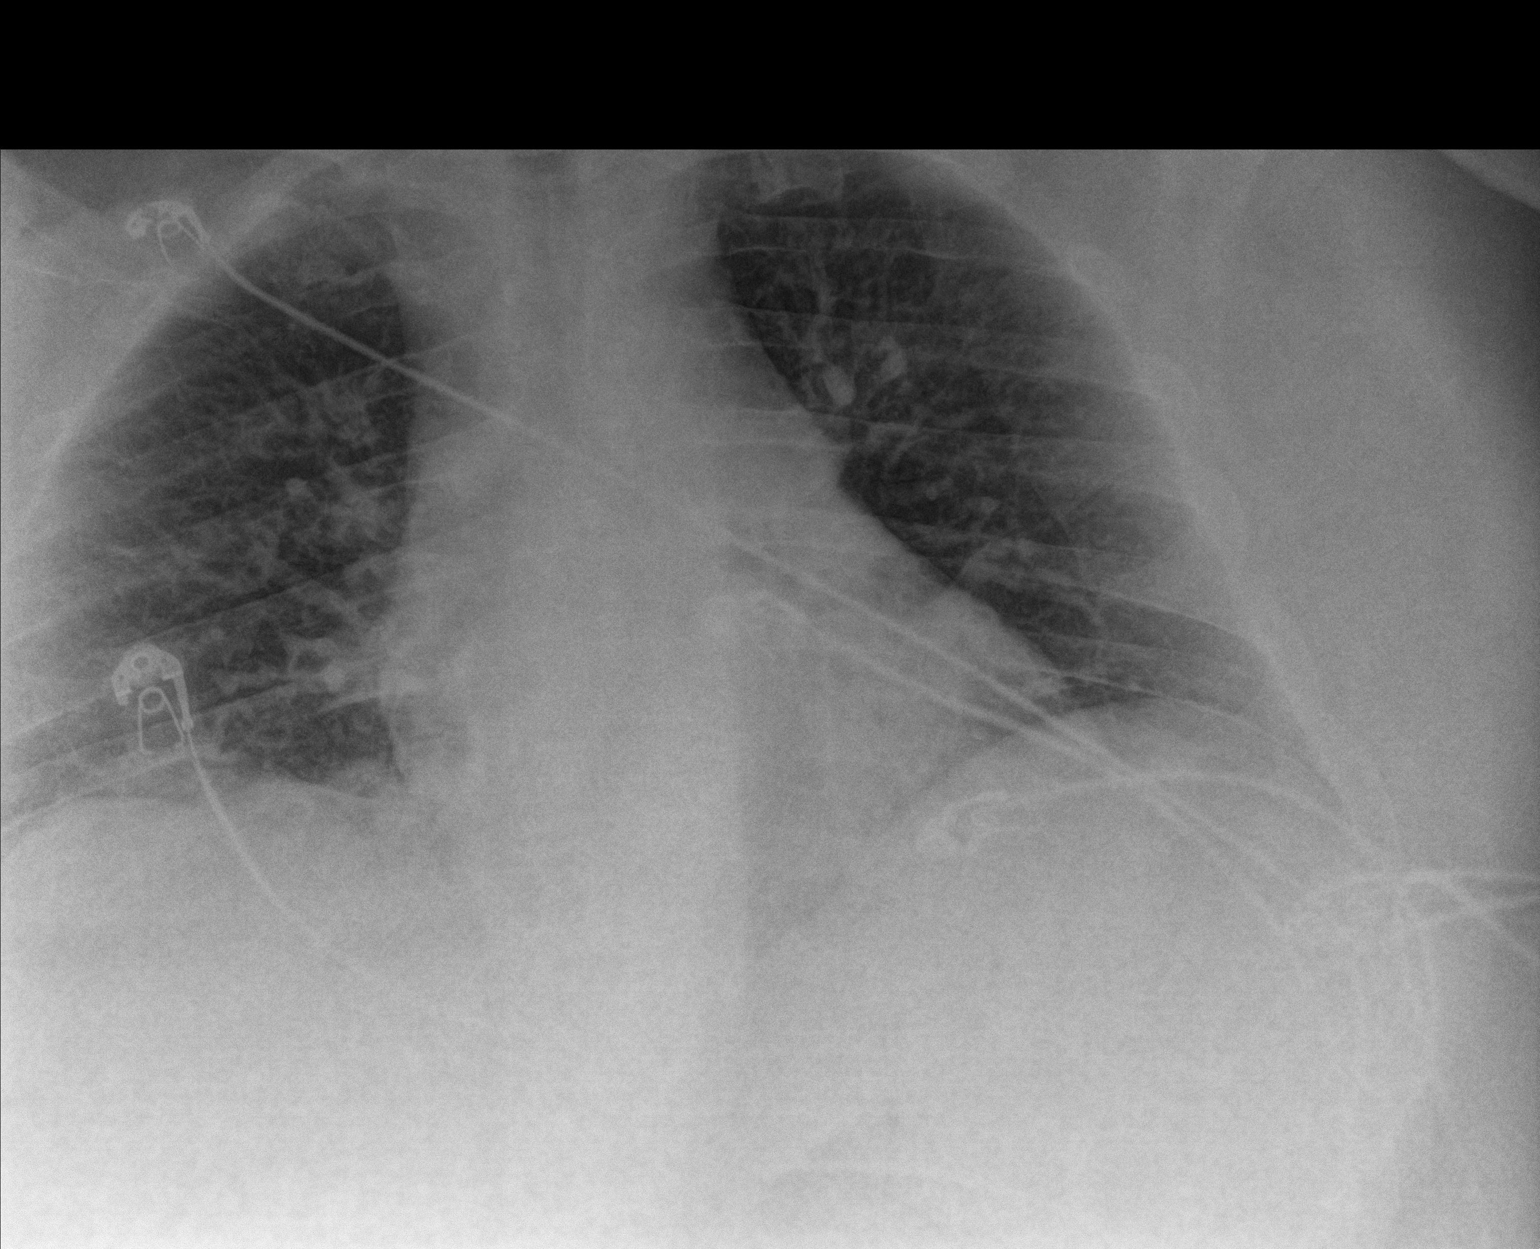

[chest ap (2 of 2)]
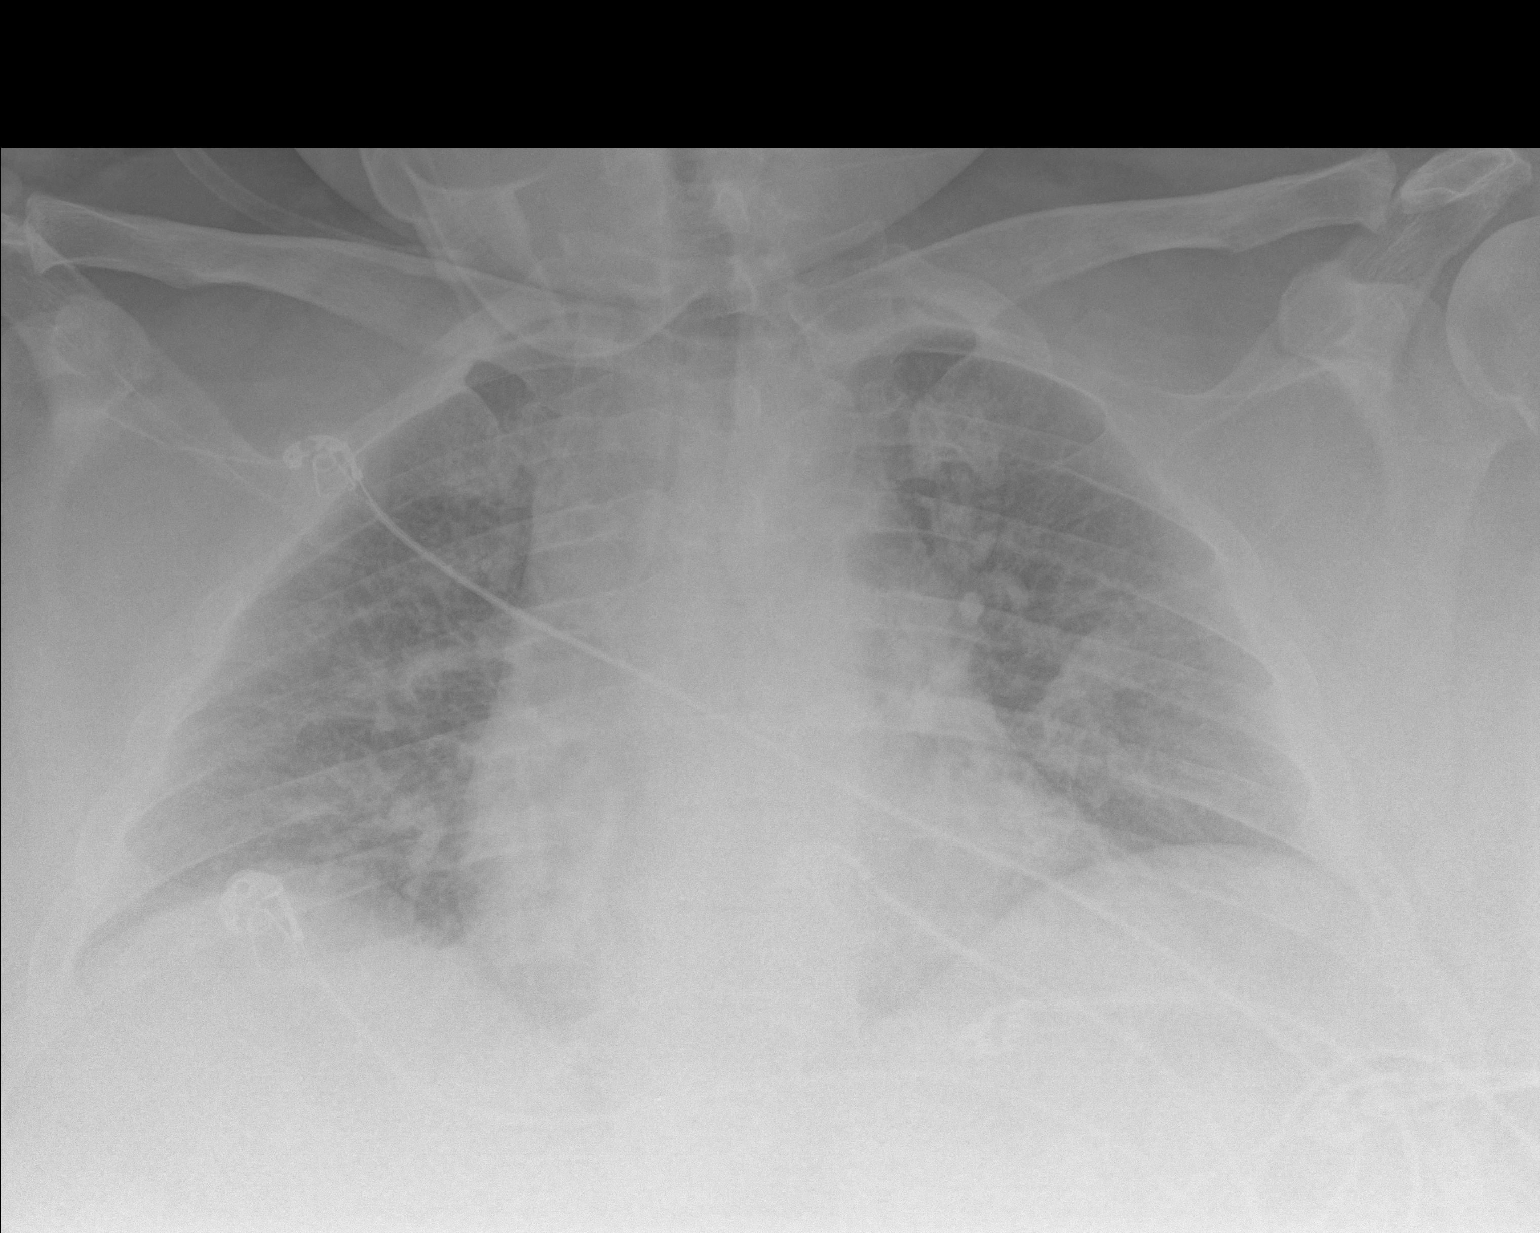

[2 of 2 positions shown; findings below may reference images not displayed]

FINDINGS: Right costophrenic angle excluded from the image. Near expiratory
image which accounts for atelectasis in the lung bases and
accentuates the heart size. Lungs otherwise clear.
IMPRESSION: Near expiratory image accounts for bibasilar atelectasis.

## 2017-01-10 ENCOUNTER — Other Ambulatory Visit: Payer: Self-pay

## 2017-01-10 ENCOUNTER — Emergency Department (HOSPITAL_COMMUNITY)
Admission: EM | Admit: 2017-01-10 | Discharge: 2017-01-10 | Disposition: A | Discharge status: Home / Self Care | Payer: Medicare Other | Source: Home / Self Care

## 2017-01-10 ENCOUNTER — Encounter (HOSPITAL_COMMUNITY): Payer: Self-pay | Admitting: Internal Medicine

## 2017-01-10 ENCOUNTER — Emergency Department (HOSPITAL_COMMUNITY): Payer: Medicare Other

## 2017-01-10 ENCOUNTER — Inpatient Hospital Stay (HOSPITAL_COMMUNITY)
Admission: EM | Admit: 2017-01-10 | Discharge: 2017-01-14 | DRG: 638 | Disposition: A | Discharge status: Home / Self Care | Payer: Medicare Other | Attending: Internal Medicine | Admitting: Internal Medicine

## 2017-01-10 DIAGNOSIS — F909 Attention-deficit hyperactivity disorder, unspecified type: Secondary | ICD-10-CM | POA: Diagnosis present

## 2017-01-10 DIAGNOSIS — F1199 Opioid use, unspecified with unspecified opioid-induced disorder: Secondary | ICD-10-CM

## 2017-01-10 DIAGNOSIS — E785 Hyperlipidemia, unspecified: Secondary | ICD-10-CM | POA: Diagnosis present

## 2017-01-10 DIAGNOSIS — Z811 Family history of alcohol abuse and dependence: Secondary | ICD-10-CM | POA: Diagnosis not present

## 2017-01-10 DIAGNOSIS — L02519 Cutaneous abscess of unspecified hand: Secondary | ICD-10-CM | POA: Diagnosis present

## 2017-01-10 DIAGNOSIS — E11621 Type 2 diabetes mellitus with foot ulcer: Secondary | ICD-10-CM | POA: Diagnosis present

## 2017-01-10 DIAGNOSIS — I10 Essential (primary) hypertension: Secondary | ICD-10-CM | POA: Diagnosis present

## 2017-01-10 DIAGNOSIS — Z794 Long term (current) use of insulin: Secondary | ICD-10-CM | POA: Diagnosis not present

## 2017-01-10 DIAGNOSIS — E118 Type 2 diabetes mellitus with unspecified complications: Secondary | ICD-10-CM | POA: Diagnosis not present

## 2017-01-10 DIAGNOSIS — T424X5A Adverse effect of benzodiazepines, initial encounter: Secondary | ICD-10-CM | POA: Diagnosis present

## 2017-01-10 DIAGNOSIS — L97428 Non-pressure chronic ulcer of left heel and midfoot with other specified severity: Secondary | ICD-10-CM | POA: Diagnosis not present

## 2017-01-10 DIAGNOSIS — F319 Bipolar disorder, unspecified: Secondary | ICD-10-CM | POA: Diagnosis present

## 2017-01-10 DIAGNOSIS — G894 Chronic pain syndrome: Secondary | ICD-10-CM | POA: Diagnosis present

## 2017-01-10 DIAGNOSIS — Z9989 Dependence on other enabling machines and devices: Secondary | ICD-10-CM | POA: Diagnosis not present

## 2017-01-10 DIAGNOSIS — G4733 Obstructive sleep apnea (adult) (pediatric): Secondary | ICD-10-CM | POA: Diagnosis present

## 2017-01-10 DIAGNOSIS — F1110 Opioid abuse, uncomplicated: Secondary | ICD-10-CM | POA: Diagnosis present

## 2017-01-10 DIAGNOSIS — F17210 Nicotine dependence, cigarettes, uncomplicated: Secondary | ICD-10-CM | POA: Diagnosis present

## 2017-01-10 DIAGNOSIS — L03116 Cellulitis of left lower limb: Secondary | ICD-10-CM | POA: Diagnosis present

## 2017-01-10 DIAGNOSIS — Z6841 Body Mass Index (BMI) 40.0 and over, adult: Secondary | ICD-10-CM | POA: Diagnosis not present

## 2017-01-10 DIAGNOSIS — Z7982 Long term (current) use of aspirin: Secondary | ICD-10-CM | POA: Diagnosis not present

## 2017-01-10 DIAGNOSIS — F331 Major depressive disorder, recurrent, moderate: Secondary | ICD-10-CM | POA: Diagnosis present

## 2017-01-10 DIAGNOSIS — Z79899 Other long term (current) drug therapy: Secondary | ICD-10-CM

## 2017-01-10 DIAGNOSIS — L97529 Non-pressure chronic ulcer of other part of left foot with unspecified severity: Secondary | ICD-10-CM | POA: Diagnosis present

## 2017-01-10 DIAGNOSIS — F1490 Cocaine use, unspecified, uncomplicated: Secondary | ICD-10-CM | POA: Diagnosis present

## 2017-01-10 DIAGNOSIS — R768 Other specified abnormal immunological findings in serum: Secondary | ICD-10-CM | POA: Diagnosis not present

## 2017-01-10 DIAGNOSIS — Z8673 Personal history of transient ischemic attack (TIA), and cerebral infarction without residual deficits: Secondary | ICD-10-CM | POA: Diagnosis not present

## 2017-01-10 DIAGNOSIS — F1990 Other psychoactive substance use, unspecified, uncomplicated: Secondary | ICD-10-CM | POA: Diagnosis not present

## 2017-01-10 DIAGNOSIS — F1910 Other psychoactive substance abuse, uncomplicated: Secondary | ICD-10-CM | POA: Diagnosis not present

## 2017-01-10 DIAGNOSIS — T40605A Adverse effect of unspecified narcotics, initial encounter: Secondary | ICD-10-CM | POA: Diagnosis present

## 2017-01-10 DIAGNOSIS — M79672 Pain in left foot: Secondary | ICD-10-CM | POA: Diagnosis present

## 2017-01-10 DIAGNOSIS — L97509 Non-pressure chronic ulcer of other part of unspecified foot with unspecified severity: Secondary | ICD-10-CM

## 2017-01-10 DIAGNOSIS — Z818 Family history of other mental and behavioral disorders: Secondary | ICD-10-CM | POA: Diagnosis not present

## 2017-01-10 DIAGNOSIS — E11628 Type 2 diabetes mellitus with other skin complications: Secondary | ICD-10-CM | POA: Diagnosis present

## 2017-01-10 DIAGNOSIS — E662 Morbid (severe) obesity with alveolar hypoventilation: Secondary | ICD-10-CM | POA: Diagnosis not present

## 2017-01-10 DIAGNOSIS — E1121 Type 2 diabetes mellitus with diabetic nephropathy: Secondary | ICD-10-CM | POA: Diagnosis not present

## 2017-01-10 DIAGNOSIS — I110 Hypertensive heart disease with heart failure: Secondary | ICD-10-CM | POA: Diagnosis present

## 2017-01-10 DIAGNOSIS — R600 Localized edema: Secondary | ICD-10-CM

## 2017-01-10 DIAGNOSIS — Z9119 Patient's noncompliance with other medical treatment and regimen: Secondary | ICD-10-CM

## 2017-01-10 DIAGNOSIS — B1920 Unspecified viral hepatitis C without hepatic coma: Secondary | ICD-10-CM | POA: Diagnosis present

## 2017-01-10 DIAGNOSIS — Z810 Family history of intellectual disabilities: Secondary | ICD-10-CM | POA: Diagnosis not present

## 2017-01-10 DIAGNOSIS — Z915 Personal history of self-harm: Secondary | ICD-10-CM

## 2017-01-10 DIAGNOSIS — I5032 Chronic diastolic (congestive) heart failure: Secondary | ICD-10-CM | POA: Diagnosis present

## 2017-01-10 DIAGNOSIS — F419 Anxiety disorder, unspecified: Secondary | ICD-10-CM | POA: Diagnosis present

## 2017-01-10 DIAGNOSIS — J45909 Unspecified asthma, uncomplicated: Secondary | ICD-10-CM | POA: Diagnosis present

## 2017-01-10 DIAGNOSIS — G92 Toxic encephalopathy: Secondary | ICD-10-CM | POA: Diagnosis present

## 2017-01-10 DIAGNOSIS — L0390 Cellulitis, unspecified: Secondary | ICD-10-CM | POA: Diagnosis not present

## 2017-01-10 DIAGNOSIS — F1120 Opioid dependence, uncomplicated: Secondary | ICD-10-CM

## 2017-01-10 DIAGNOSIS — L03119 Cellulitis of unspecified part of limb: Secondary | ICD-10-CM

## 2017-01-10 DIAGNOSIS — F1920 Other psychoactive substance dependence, uncomplicated: Secondary | ICD-10-CM

## 2017-01-10 DIAGNOSIS — F1190 Opioid use, unspecified, uncomplicated: Secondary | ICD-10-CM

## 2017-01-10 DIAGNOSIS — E119 Type 2 diabetes mellitus without complications: Secondary | ICD-10-CM

## 2017-01-10 DIAGNOSIS — R400 Somnolence: Secondary | ICD-10-CM | POA: Diagnosis not present

## 2017-01-10 DIAGNOSIS — I5030 Unspecified diastolic (congestive) heart failure: Secondary | ICD-10-CM | POA: Diagnosis not present

## 2017-01-10 DIAGNOSIS — F3111 Bipolar disorder, current episode manic without psychotic features, mild: Secondary | ICD-10-CM | POA: Diagnosis not present

## 2017-01-10 LAB — CBC WITH DIFFERENTIAL/PLATELET
Basophils Absolute: 0 10*3/uL (ref 0.0–0.1)
Basophils Relative: 0 %
EOS ABS: 0.2 10*3/uL (ref 0.0–0.7)
Eosinophils Relative: 2 %
HCT: 41 % (ref 39.0–52.0)
HEMOGLOBIN: 13.9 g/dL (ref 13.0–17.0)
LYMPHS ABS: 2.8 10*3/uL (ref 0.7–4.0)
LYMPHS PCT: 32 %
MCH: 31.4 pg (ref 26.0–34.0)
MCHC: 33.9 g/dL (ref 30.0–36.0)
MCV: 92.6 fL (ref 78.0–100.0)
Monocytes Absolute: 0.6 10*3/uL (ref 0.1–1.0)
Monocytes Relative: 7 %
NEUTROS PCT: 59 %
Neutro Abs: 5.2 10*3/uL (ref 1.7–7.7)
Platelets: 223 10*3/uL (ref 150–400)
RBC: 4.43 MIL/uL (ref 4.22–5.81)
RDW: 13.2 % (ref 11.5–15.5)
WBC: 8.8 10*3/uL (ref 4.0–10.5)

## 2017-01-10 LAB — COMPREHENSIVE METABOLIC PANEL
ALBUMIN: 3.3 g/dL — AB (ref 3.5–5.0)
ALK PHOS: 63 U/L (ref 38–126)
ALT: 24 U/L (ref 17–63)
AST: 27 U/L (ref 15–41)
Anion gap: 8 (ref 5–15)
BILIRUBIN TOTAL: 0.5 mg/dL (ref 0.3–1.2)
BUN: 8 mg/dL (ref 6–20)
CO2: 27 mmol/L (ref 22–32)
CREATININE: 0.87 mg/dL (ref 0.61–1.24)
Calcium: 8.7 mg/dL — ABNORMAL LOW (ref 8.9–10.3)
Chloride: 100 mmol/L — ABNORMAL LOW (ref 101–111)
GFR calc Af Amer: >60 mL/min (ref >60)
GFR calc non Af Amer: >60 mL/min (ref >60)
GLUCOSE: 127 mg/dL — AB (ref 65–99)
Potassium: 3.8 mmol/L (ref 3.5–5.1)
SODIUM: 135 mmol/L (ref 135–145)
Total Protein: 7.5 g/dL (ref 6.5–8.1)

## 2017-01-10 LAB — GLUCOSE, CAPILLARY: GLUCOSE-CAPILLARY: 117 mg/dL — AB (ref 65–99)

## 2017-01-10 LAB — SEDIMENTATION RATE: SED RATE: 48 mm/h — AB (ref 0–16)

## 2017-01-10 MED ORDER — AMPHETAMINE-DEXTROAMPHETAMINE 10 MG PO TABS
30.0000 mg | ORAL_TABLET | Freq: Every evening | ORAL | Status: DC
  Administered 2017-01-11 – 2017-01-14 (×4): 30 mg via ORAL
  Filled 2017-01-10 (×4): qty 3

## 2017-01-10 MED ORDER — INSULIN ASPART 100 UNIT/ML ~~LOC~~ SOLN: 0.0000 [IU] | Freq: Every day | SUBCUTANEOUS | Status: DC

## 2017-01-10 MED ORDER — QUETIAPINE FUMARATE 200 MG PO TABS
300.0000 mg | ORAL_TABLET | Freq: Every evening | ORAL | Status: DC
  Administered 2017-01-12: 17:00:00 300 mg via ORAL
  Filled 2017-01-10 (×3): qty 1

## 2017-01-10 MED ORDER — PANTOPRAZOLE SODIUM 40 MG PO TBEC
40.0000 mg | DELAYED_RELEASE_TABLET | Freq: Every day | ORAL | Status: DC
  Administered 2017-01-11 – 2017-01-14 (×4): 40 mg via ORAL
  Filled 2017-01-10 (×4): qty 1

## 2017-01-10 MED ORDER — ALBUTEROL SULFATE (2.5 MG/3ML) 0.083% IN NEBU: 2.5000 mg | INHALATION_SOLUTION | RESPIRATORY_TRACT | Status: DC | PRN

## 2017-01-10 MED ORDER — OXYCODONE HCL 5 MG PO TABS
30.0000 mg | ORAL_TABLET | Freq: Four times a day (QID) | ORAL | Status: DC | PRN
  Administered 2017-01-11 – 2017-01-12 (×4): 30 mg via ORAL
  Filled 2017-01-10 (×5): qty 6

## 2017-01-10 MED ORDER — POLYETHYLENE GLYCOL 3350 17 G PO PACK: 17.0000 g | PACK | Freq: Every day | ORAL | Status: DC | PRN

## 2017-01-10 MED ORDER — CITALOPRAM HYDROBROMIDE 20 MG PO TABS
20.0000 mg | ORAL_TABLET | ORAL | Status: DC
  Administered 2017-01-11 – 2017-01-13 (×2): 20 mg via ORAL
  Filled 2017-01-10 (×3): qty 1

## 2017-01-10 MED ORDER — INSULIN ASPART 100 UNIT/ML ~~LOC~~ SOLN
0.0000 [IU] | Freq: Three times a day (TID) | SUBCUTANEOUS | Status: DC
  Administered 2017-01-11 (×2): 2 [IU] via SUBCUTANEOUS
  Administered 2017-01-11: 3 [IU] via SUBCUTANEOUS
  Administered 2017-01-12 (×2): 2 [IU] via SUBCUTANEOUS
  Administered 2017-01-13 (×2): 3 [IU] via SUBCUTANEOUS
  Administered 2017-01-13: 5 [IU] via SUBCUTANEOUS
  Administered 2017-01-14 (×2): 8 [IU] via SUBCUTANEOUS
  Administered 2017-01-14: 2 [IU] via SUBCUTANEOUS

## 2017-01-10 MED ORDER — FAMOTIDINE 20 MG PO TABS
10.0000 mg | ORAL_TABLET | Freq: Every day | ORAL | Status: DC
  Administered 2017-01-11 – 2017-01-14 (×4): 10 mg via ORAL
  Filled 2017-01-10 (×4): qty 1

## 2017-01-10 MED ORDER — FUROSEMIDE 40 MG PO TABS
40.0000 mg | ORAL_TABLET | Freq: Every day | ORAL | Status: DC
  Administered 2017-01-11 – 2017-01-14 (×3): 40 mg via ORAL
  Filled 2017-01-10 (×4): qty 1

## 2017-01-10 MED ORDER — VANCOMYCIN HCL 10 G IV SOLR
1250.0000 mg | Freq: Three times a day (TID) | INTRAVENOUS | Status: DC
  Administered 2017-01-11 – 2017-01-13 (×7): 1250 mg via INTRAVENOUS
  Filled 2017-01-10 (×10): qty 1250

## 2017-01-10 MED ORDER — ONDANSETRON HCL 4 MG/2ML IJ SOLN: 4.0000 mg | Freq: Four times a day (QID) | INTRAMUSCULAR | Status: DC | PRN

## 2017-01-10 MED ORDER — ONDANSETRON HCL 4 MG PO TABS: 4.0000 mg | ORAL_TABLET | Freq: Four times a day (QID) | ORAL | Status: DC | PRN

## 2017-01-10 MED ORDER — QUETIAPINE FUMARATE 200 MG PO TABS
200.0000 mg | ORAL_TABLET | Freq: Every day | ORAL | Status: DC
  Administered 2017-01-10: 200 mg via ORAL
  Administered 2017-01-11: 100 mg via ORAL
  Administered 2017-01-12: 200 mg via ORAL
  Filled 2017-01-10 (×2): qty 1

## 2017-01-10 MED ORDER — QUETIAPINE FUMARATE 100 MG PO TABS
100.0000 mg | ORAL_TABLET | Freq: Every morning | ORAL | Status: DC
  Administered 2017-01-11 – 2017-01-13 (×2): 100 mg via ORAL
  Filled 2017-01-10 (×3): qty 1

## 2017-01-10 MED ORDER — MORPHINE SULFATE (PF) 4 MG/ML IV SOLN
4.0000 mg | Freq: Once | INTRAVENOUS | Status: AC
  Administered 2017-01-10: 4 mg via INTRAVENOUS
  Filled 2017-01-10: qty 1

## 2017-01-10 MED ORDER — VANCOMYCIN HCL 10 G IV SOLR
2000.0000 mg | Freq: Once | INTRAVENOUS | Status: AC
  Administered 2017-01-10: 2000 mg via INTRAVENOUS
  Filled 2017-01-10: qty 2000

## 2017-01-10 MED ORDER — ATORVASTATIN CALCIUM 20 MG PO TABS
20.0000 mg | ORAL_TABLET | Freq: Every day | ORAL | Status: DC
  Administered 2017-01-10 – 2017-01-13 (×4): 20 mg via ORAL
  Filled 2017-01-10 (×4): qty 1

## 2017-01-10 MED ORDER — SENNA 8.6 MG PO TABS
1.0000 | ORAL_TABLET | Freq: Two times a day (BID) | ORAL | Status: DC
  Administered 2017-01-11 – 2017-01-14 (×7): 8.6 mg via ORAL
  Filled 2017-01-10 (×8): qty 1

## 2017-01-10 MED ORDER — AMPHETAMINE-DEXTROAMPHETAMINE 10 MG PO TABS
60.0000 mg | ORAL_TABLET | Freq: Every day | ORAL | Status: DC
Start: 2017-01-11 — End: 2017-01-14
  Administered 2017-01-11 – 2017-01-14 (×4): 60 mg via ORAL
  Filled 2017-01-10 (×5): qty 6

## 2017-01-10 MED ORDER — QUETIAPINE FUMARATE 100 MG PO TABS: 100.0000 mg | ORAL_TABLET | Freq: Three times a day (TID) | ORAL | Status: DC

## 2017-01-10 MED ORDER — TIZANIDINE HCL 4 MG PO TABS: 4.0000 mg | ORAL_TABLET | Freq: Three times a day (TID) | ORAL | Status: DC | PRN

## 2017-01-10 MED ORDER — PIPERACILLIN-TAZOBACTAM 3.375 G IVPB 30 MIN: 3.3750 g | Freq: Once | INTRAVENOUS | Status: DC

## 2017-01-10 MED ORDER — TRAZODONE HCL 50 MG PO TABS
150.0000 mg | ORAL_TABLET | Freq: Every day | ORAL | Status: DC
  Administered 2017-01-10 – 2017-01-13 (×4): 150 mg via ORAL
  Filled 2017-01-10 (×5): qty 3

## 2017-01-10 MED ORDER — SODIUM CHLORIDE 0.9% FLUSH: 3.0000 mL | Freq: Two times a day (BID) | INTRAVENOUS | Status: DC

## 2017-01-10 MED ORDER — BISACODYL 10 MG RE SUPP: 10.0000 mg | Freq: Every day | RECTAL | Status: DC | PRN

## 2017-01-10 MED ORDER — AMPHETAMINE-DEXTROAMPHETAMINE 30 MG PO TABS: 30.0000 mg | ORAL_TABLET | Freq: Two times a day (BID) | ORAL | Status: DC

## 2017-01-10 MED ORDER — SODIUM CHLORIDE 0.9% FLUSH: 3.0000 mL | INTRAVENOUS | Status: DC | PRN

## 2017-01-10 MED ORDER — URSODIOL 300 MG PO CAPS
300.0000 mg | ORAL_CAPSULE | Freq: Two times a day (BID) | ORAL | Status: DC
  Administered 2017-01-11 – 2017-01-14 (×7): 300 mg via ORAL
  Filled 2017-01-10 (×9): qty 1

## 2017-01-10 MED ORDER — DIVALPROEX SODIUM 250 MG PO DR TAB
500.0000 mg | DELAYED_RELEASE_TABLET | Freq: Two times a day (BID) | ORAL | Status: DC
  Administered 2017-01-10 – 2017-01-13 (×6): 500 mg via ORAL
  Filled 2017-01-10 (×7): qty 2

## 2017-01-10 MED ORDER — MOMETASONE FURO-FORMOTEROL FUM 200-5 MCG/ACT IN AERO
2.0000 | INHALATION_SPRAY | Freq: Two times a day (BID) | RESPIRATORY_TRACT | Status: DC
  Administered 2017-01-11 – 2017-01-14 (×6): 2 via RESPIRATORY_TRACT
  Filled 2017-01-10: qty 8.8

## 2017-01-10 MED ORDER — PIPERACILLIN-TAZOBACTAM 3.375 G IVPB
3.3750 g | Freq: Three times a day (TID) | INTRAVENOUS | Status: DC
  Filled 2017-01-10: qty 50

## 2017-01-10 MED ORDER — CLONIDINE HCL 0.2 MG PO TABS
0.2000 mg | ORAL_TABLET | Freq: Two times a day (BID) | ORAL | Status: DC
  Administered 2017-01-10 – 2017-01-14 (×8): 0.2 mg via ORAL
  Filled 2017-01-10 (×8): qty 1

## 2017-01-10 MED ORDER — SODIUM CHLORIDE 0.9 % IV SOLN: 250.0000 mL | INTRAVENOUS | Status: DC | PRN

## 2017-01-10 MED ORDER — GABAPENTIN 400 MG PO CAPS
800.0000 mg | ORAL_CAPSULE | Freq: Three times a day (TID) | ORAL | Status: DC
  Administered 2017-01-10 – 2017-01-14 (×12): 800 mg via ORAL
  Filled 2017-01-10 (×12): qty 2

## 2017-01-10 MED ORDER — ALPRAZOLAM 1 MG PO TABS
2.0000 mg | ORAL_TABLET | Freq: Three times a day (TID) | ORAL | Status: DC
  Administered 2017-01-10 – 2017-01-12 (×5): 2 mg via ORAL
  Filled 2017-01-10 (×5): qty 4

## 2017-01-10 NOTE — Progress Notes (Addendum)
Pharmacy Antibiotic Note  Bryan Gallegos is a 41 y.o. male admitted on 01/10/2017 with cellulitis.  Pharmacy has been consulted for vancomycin and zosyn dosing.  Plan: Zosyn 3.375 gm IV x 1 dose over 30 minutes then zosyn 3.375 gm IV q8hrs, infuse each dose over 4 hours Vancomycin 2 gm loading dose followed by vancomycin 1250 mg IV q8h for est AUC 457 F/u renal fxn, wbc, temp, culture data Vancomycin levels as needed  Height: 6\' 2"  (188 cm) Weight: (!) 360 lb (163.3 kg) IBW/kg (Calculated) : 82.2  Temp (24hrs), Avg:97.8 F (36.6 C), Min:97.4 F (36.3 C), Max:98.2 F (36.8 C)  Recent Labs  Lab 01/10/17 1951  WBC 8.8  CREATININE 0.87    Estimated Creatinine Clearance: 177.5 mL/min (by C-G formula based on SCr of 0.87 mg/dL).    Allergies  Allergen Reactions  . Ace Inhibitors Anaphylaxis  . Atenolol Anaphylaxis  . Lisinopril Anaphylaxis  . Prednisone Hives  . Ibuprofen Itching, Swelling and Other (See Comments)    Hand/feet swelling   . Tylenol [Acetaminophen] Other (See Comments)    Reaction:  GI bleeding     Thank you for allowing pharmacy to be a part of this patient's care.  Herby AbrahamMichelle T. Navaya Wiatrek, Pharm.D. [phone] 01/10/2017 9:43 PM

## 2017-01-10 NOTE — ED Notes (Signed)
Nurse Heidi to call back in 10 minutes for report

## 2017-01-10 NOTE — ED Notes (Signed)
Patient transported to X-ray 

## 2017-01-10 NOTE — ED Provider Notes (Signed)
Emergency Department Provider Note   I have reviewed the triage vital signs and the nursing notes.   HISTORY  Chief Complaint Leg Swelling (Left)   HPI Bryan Gallegos is a 41 y.o. male with PMH mof asthma, HLD, HTN, obesity, and polysubstance abuse invluding recent IV drug use since to the emergency department for evaluation of continued pain and swelling in the left lower extremity with skin redness.  Patient states that he has had the symptoms over the last 7 days.  He went to the emergency department on 11/9 and was sent home with Keflex.  Patient states that the redness has not improved.  He is continuing to have fevers.  He is noticing that the wound on the foot is draining "cottage cheese" material also has not red and draining material.  He states that he last used IV drugs on 11/8.  He denies sharing needles.  He went to his primary care physician today who referred him to the emergency department. Denies any CP or SOB.    Past Medical History:  Diagnosis Date  . Adult ADHD   . Anaphylactic reaction   . Anxiety   . Asthma   . Bipolar disorder (HCC)   . Complication of anesthesia    Per patient difficult intubation;  . Diabetes mellitus   . Difficult intubation    Per patient  . Drug overdose   . Heart valve disorder    s/p echocardiogram  . Hyperlipidemia   . Hypertension   . Morbidly obese (HCC)   . OSA (obstructive sleep apnea)   . Polysubstance abuse (HCC)   . Transient cerebral ischemia    Unknown    Patient Active Problem List   Diagnosis Date Noted  . Cellulitis in diabetic foot (HCC) 01/10/2017  . Diabetic foot ulcer (HCC) 01/10/2017  . Hypotension 11/11/2016  . Suicide attempt (HCC) 10/17/2016  . Acute and chronic respiratory failure with hypercapnia (HCC)   . Pulmonary edema   . Syncope   . Respiratory failure (HCC) 01/04/2016  . Drug overdose   . Acute encephalopathy 01/01/2016  . Hyponatremia 01/01/2016  . Chronic pain syndrome  10/22/2015  . Healthcare maintenance 10/22/2015  . Hoarse voice quality 10/11/2015  . Major depressive disorder, recurrent severe without psychotic features (HCC) 09/04/2015  . Polysubstance dependence including opioid type drug, episodic abuse (HCC) 09/04/2015  . RUQ abdominal pain   . Cholelithiasis 05/17/2015  . Antisocial personality disorder (HCC) 04/11/2015  . Opioid use disorder, severe, dependence (HCC) 04/11/2015  . Benzodiazepine dependence (HCC) 04/11/2015  . Tobacco use disorder 04/10/2015  . Narcotic dependency, continuous (HCC) 03/14/2014  . Chronic back pain greater than 3 months duration 01/28/2014  . Gastroesophageal reflux disease with esophagitis 01/28/2014  . Essential hypertension, benign 01/28/2014  . Diabetes mellitus type II, controlled (HCC) 01/28/2014  . Morbid obesity (HCC) 01/28/2014  . TIA (transient ischemic attack) 09/02/2013  . HLD (hyperlipidemia) 08/06/2013  . OSA on CPAP 08/06/2013    Past Surgical History:  Procedure Laterality Date  . CARPAL TUNNEL RELEASE    . NOSE SURGERY    . TOOTH EXTRACTION    . WISDOM TOOTH EXTRACTION        Allergies Ace inhibitors; Atenolol; Lisinopril; Prednisone; Ibuprofen; and Tylenol [acetaminophen]  Family History  Problem Relation Age of Onset  . CAD Mother        Living  . Diabetes Mellitus II Mother   . Stroke Mother   . Hypertension Mother   .  Congestive Heart Failure Mother   . Kidney disease Mother   . Fibromyalgia Mother   . Thyroid disease Mother   . Hyperlipidemia Mother   . Liver disease Mother   . Alcoholism Father [id number]       Deceased  . Arthritis Maternal Grandmother   . Congestive Heart Failure Maternal Grandmother   . Hypertension Maternal Grandmother   . Lung cancer Maternal Grandfather   . Colon cancer Maternal Aunt   . Stomach cancer Maternal Aunt   . Heart disease Other        Paternal & Maternal  . Crohn's disease Other   . Hypertension Other        Paternal & Maternal  .  Hypertension Brother        x3  . Hypertension Sister        #1  . Bipolar disorder Sister        #1  . ADD / ADHD Son        x3  . Bipolar disorder Son        x3  . Asperger's syndrome Son     Social History Social History   Tobacco Use  . Smoking status: Current Every Day Smoker    Packs/day: 0.50    Years: 23.00    Pack years: 11.50    Types: Cigarettes  . Smokeless tobacco: Never Used  Substance Use Topics  . Alcohol use: No    Alcohol/week: 0.0 oz  . Drug use: No    Review of Systems  Constitutional: Positive fever/chills Eyes: No visual changes. ENT: No sore throat. Cardiovascular: Denies chest pain. Respiratory: Denies shortness of breath. Gastrointestinal: No abdominal pain.  No nausea, no vomiting.  No diarrhea.  No constipation. Genitourinary: Negative for dysuria. Musculoskeletal: Negative for back pain. Positive left leg pain and swelling.  Skin: Positive rash.  Neurological: Negative for headaches, focal weakness or numbness.  10-point ROS otherwise negative.  ____________________________________________   PHYSICAL EXAM:  VITAL SIGNS: ED Triage Vitals  Enc Vitals Group     BP 01/10/17 1841 (!) 161/88     Pulse Rate 01/10/17 1841 93     Resp 01/10/17 1841 18     Temp 01/10/17 1841 98.2 F (36.8 C)     Temp Source 01/10/17 1841 Oral     SpO2 01/10/17 1841 100 %     Weight 01/10/17 1850 (!) 360 lb (163.3 kg)     Height 01/10/17 1850 6\' 2"  (1.88 m)     Pain Score 01/10/17 1849 10   Constitutional: Alert and oriented. Well appearing and in no acute distress. Eyes: Conjunctivae are normal.  Head: Atraumatic. Nose: No congestion/rhinnorhea. Mouth/Throat: Mucous membranes are slightly dry.  Neck: No stridor. Cardiovascular: Normal rate, regular rhythm. Good peripheral circulation. Grossly normal heart sounds.   Respiratory: Normal respiratory effort.  No retractions. Lungs CTAB. Gastrointestinal: Soft and nontender. No distention.    Musculoskeletal: No lower extremity tenderness nor edema. No gross deformities of extremities. Neurologic:  Normal speech and language. No gross focal neurologic deficits are appreciated.  Skin:  Skin is warm. Left leg is swollen with two main patches of erythema over the left lateral foot and left anterior lower leg. The lateral foot lesion is draining and slightly ulcerated. Patient also with erythema   ____________________________________________   LABS (all labs ordered are listed, but only abnormal results are displayed)  Labs Reviewed  COMPREHENSIVE METABOLIC PANEL - Abnormal; Notable for the following components:  Result Value   Chloride 100 (*)    Glucose, Bld 127 (*)    Calcium 8.7 (*)    Albumin 3.3 (*)    All other components within normal limits  SEDIMENTATION RATE - Abnormal; Notable for the following components:   Sed Rate 48 (*)    All other components within normal limits  GLUCOSE, CAPILLARY - Abnormal; Notable for the following components:   Glucose-Capillary 117 (*)    All other components within normal limits  CULTURE, BLOOD (ROUTINE X 2)  CULTURE, BLOOD (ROUTINE X 2)  MRSA PCR SCREENING  CBC WITH DIFFERENTIAL/PLATELET  C-REACTIVE PROTEIN  RAPID URINE DRUG SCREEN, HOSP PERFORMED  HEMOGLOBIN A1C  PREALBUMIN  HIV ANTIBODY (ROUTINE TESTING)  VALPROIC ACID LEVEL  MAGNESIUM  PHOSPHORUS  TSH  COMPREHENSIVE METABOLIC PANEL  CBC   ____________________________________________  RADIOLOGY  Dg Hand Complete Right  Result Date: 01/10/2017 CLINICAL DATA:  Right hand redness, infection EXAM: RIGHT HAND - COMPLETE 3+ VIEW COMPARISON:  06/07/2014 FINDINGS: Diffuse soft tissue swelling. No fracture or malalignment. No periostitis or bone destruction. 11 mm linear radiopaque foreign body within the soft tissues along the ulnar aspect of the wrist. IMPRESSION: 1. No acute osseous abnormality. 2. 11 mm linear radiopaque foreign body within the soft tissues along the  ulnar aspect of the wrist. Electronically Signed   By: Jasmine Pang M.D.   On: 01/10/2017 22:03   Dg Foot Complete Left  Result Date: 01/10/2017 CLINICAL DATA:  41 year old male with history of diabetes and hypertension presenting with left foot pain and swelling. EXAM: LEFT FOOT - COMPLETE 3+ VIEW COMPARISON:  Left foot radiograph dated 01/07/2017 FINDINGS: There is no acute fracture or dislocation. No significant arthritic changes. There is diffuse soft tissue swelling of the foot primarily over the forefoot. A tiny radiopaque focus is seen in the superficial soft tissues of the plantar aspect of the hindfoot as seen on the prior radiograph. There is a focal area of soft tissue irregularity along the plantar aspect of the lateral foot seen on the oblique view which may represent a wound or ulcer. Clinical correlation is recommended. No soft tissue gas. IMPRESSION: 1. No acute fracture or dislocation. 2. Diffuse soft tissue swelling of the foot primarily over the forefoot. Clinical correlation is recommended. 3. Small radiopaque focus in the superficial soft tissues of the plantar hindfoot similar to prior radiograph. 4. Focal skin irregularity along the lateral plantar foot may represent an ulcer. Clinical correlation is recommended. No soft tissue gas. Electronically Signed   By: Elgie Collard M.D.   On: 01/10/2017 19:53    ____________________________________________   PROCEDURES  Procedure(s) performed:   Procedures  None ____________________________________________   INITIAL IMPRESSION / ASSESSMENT AND PLAN / ED COURSE  Pertinent labs & imaging results that were available during my care of the patient were reviewed by me and considered in my medical decision making (see chart for details).  Patient presents to the emergency department with cellulitis over the left lateral foot, left lower extremity, and right upper extremity.  He was started on Keflex on 11/9 after an ED visit.   He is afebrile here but states he has had fevers today at home as high as 101 F.  I do not appreciate any murmur on exam.  Given his history of IV drug abuse he is at elevated risk for this as well as bacteremia.  His cellulitis has not improved with outpatient therapy.  Plan to send blood cultures, labs, and likely  admit for IV abx.   Labs and imaging reviewed. Plan for admission with failed antibiotic therapy as an outpatient.   Discussed patient's case with Hospital[facility], Dr. Adela Glimpse to request admission. Patient and family (if present) updated with plan. Care transferred to Hospitalist service.  I reviewed all nursing notes, vitals, pertinent old records, EKGs, labs, imaging (as available).  ____________________________________________  FINAL CLINICAL IMPRESSION(S) / ED DIAGNOSES  Final diagnoses:  Cellulitis of left lower extremity  Leg edema     MEDICATIONS GIVEN DURING THIS VISIT:  Medications  vancomycin (VANCOCIN) 2,000 mg in sodium chloride 0.9 % 500 mL IVPB (2,000 mg Intravenous New Bag/Given 01/10/17 2207)  insulin aspart (novoLOG) injection 0-5 Units (not administered)  insulin aspart (novoLOG) injection 0-15 Units (not administered)  vancomycin (VANCOCIN) 1,250 mg in sodium chloride 0.9 % 250 mL IVPB (not administered)  ALPRAZolam (XANAX) tablet 2 mg (2 mg Oral Given 01/10/17 2338)  atorvastatin (LIPITOR) tablet 20 mg (20 mg Oral Given 01/10/17 2338)  citalopram (CELEXA) tablet 20 mg (not administered)  cloNIDine (CATAPRES) tablet 0.2 mg (0.2 mg Oral Given 01/10/17 2338)  divalproex (DEPAKOTE) DR tablet 500 mg (500 mg Oral Given 01/10/17 2338)  mometasone-formoterol (DULERA) 200-5 MCG/ACT inhaler 2 puff (not administered)  furosemide (LASIX) tablet 40 mg (not administered)  gabapentin (NEURONTIN) capsule 800 mg (800 mg Oral Given 01/10/17 2338)  pantoprazole (PROTONIX) EC tablet 40 mg (not administered)  oxyCODONE (Oxy IR/ROXICODONE) immediate release tablet 30 mg (not  administered)  famotidine (PEPCID) tablet 10 mg (not administered)  traZODone (DESYREL) tablet 150 mg (150 mg Oral Given 01/10/17 2338)  tiZANidine (ZANAFLEX) tablet 4 mg (not administered)  ursodiol (ACTIGALL) capsule 300 mg (not administered)  ondansetron (ZOFRAN) tablet 4 mg (not administered)    Or  ondansetron (ZOFRAN) injection 4 mg (not administered)  sodium chloride flush (NS) 0.9 % injection 3 mL (not administered)  sodium chloride flush (NS) 0.9 % injection 3 mL (not administered)  0.9 %  sodium chloride infusion (not administered)  polyethylene glycol (MIRALAX / GLYCOLAX) packet 17 g (not administered)  senna (SENOKOT) tablet 8.6 mg (not administered)  bisacodyl (DULCOLAX) suppository 10 mg (not administered)  albuterol (PROVENTIL) (2.5 MG/3ML) 0.083% nebulizer solution 2.5 mg (not administered)  piperacillin-tazobactam (ZOSYN) IVPB 3.375 g (not administered)  piperacillin-tazobactam (ZOSYN) IVPB 3.375 g (not administered)  amphetamine-dextroamphetamine (ADDERALL) tablet 60 mg (not administered)    And  amphetamine-dextroamphetamine (ADDERALL) tablet 30 mg (not administered)  QUEtiapine (SEROQUEL) tablet 100 mg (not administered)    And  QUEtiapine (SEROQUEL) tablet 300 mg (not administered)    And  QUEtiapine (SEROQUEL) tablet 200 mg (200 mg Oral Given 01/10/17 2338)  morphine 4 MG/ML injection 4 mg (4 mg Intravenous Given 01/10/17 2111)    Note:  This document was prepared using Dragon voice recognition software and may include unintentional dictation errors.  Alona Bene, MD Emergency Medicine    Dorissa Stinnette, Arlyss Repress, MD 01/10/17 [phone]

## 2017-01-10 NOTE — Progress Notes (Signed)
A consult was received from an ED physician for vancomycin per pharmacy dosing.  The patient's profile has been reviewed for ht/wt/allergies/indication/available labs.   A one time order has been placed for vancomycin 2 gm.  Further antibiotics/pharmacy consults should be ordered by admitting physician if indicated.                       Thank you, Herby AbrahamMichelle T. Meshell Abdulaziz, Pharm.D. [phone] 01/10/2017 9:04 PM

## 2017-01-10 NOTE — ED Triage Notes (Signed)
Patient states he "has infection coming out of three parts of his body like cottage cheese" Both hands and left foot. [facility] and sent him home. Pt upset they did not do bloodwork. Pt states his wife died 4 months ago from "infection in the blood". Pt is barefoot, states he cant wear shoes, his aunt brought him here. Concerned he has a "blood infection".

## 2017-01-10 NOTE — H&P (Signed)
Bryan Gallegos:[id number] DOB: 16-Mar-1975 DOA: 01/10/2017     PCP: System, Provider Not In  In [facility] Outpatient Specialists: none Patient coming from:   home Lives alone wife passed away 4 months ago   Chief Complaint: Left foot pain  HPI: Bryan Gallegos is a 41 y.o. male with medical history significant of HTN, HL, DM2 and IV drug abuse, Anxiety; Asthma; Bipolar disorder (HCC), Morbidly obese, OSA  Presented with 5 day history of left foot pain after stepping on something initially he was found to have his shoes stuck to his foot when he presented to [facility] ER He was given prescriptions for Keflex and discharged from ER 3 days ago but continues to have pain patient was noted walking outside in the rain barefoot.  He reports continues to have fevers in wound on the left foot is draining cottage cheese reports last IV drug use on the eighth injects into his right arm. No chest pain shortness of breath associated this or nausea vomiting and diarrhea also reports wounds on his arms.  Reports he have had some ulcers popping up in different areas including face, mouth hands.     Regarding pertinent Chronic problems: Patient with history of diabetes on Januvia and metformin. History of poorly controlled hypertension on losartan Asian is history of depression or suicidal ideation and attempts in the past currently denies any suicidal ideations  IN ER:  Temp (24hrs), Avg:97.8 F (36.6 C), Min:97.4 F (36.3 C), Max:98.2 F (36.8 C)      on arrival  ED Triage Vitals  Enc Vitals Group     BP 01/10/17 1841 (!) 161/88     Pulse Rate 01/10/17 1841 93     Resp 01/10/17 1841 18     Temp 01/10/17 1841 98.2 F (36.8 C)     Temp Source 01/10/17 1841 Oral     SpO2 01/10/17 1841 100 %     Weight 01/10/17 1850 (!) 360 lb (163.3 kg)     Height 01/10/17 1850 6\' 2"  (1.88 m)     Head Circumference --      Peak Flow --      Pain Score 01/10/17 1849 10     Pain Loc --    Pain Edu? --      Excl. in GC? --     Latest RR 20 100 % BP 163/122 Na 135 K 3.8 Cr 0.87  WBC 8.8 Hg 13.9  Plain imaging of the foot: no gas, small density unchanged from 2017 Following Medications were ordered in ER: Medications  morphine 4 MG/ML injection 4 mg (not administered)  vancomycin (VANCOCIN) 2,000 mg in sodium chloride 0.9 % 500 mL IVPB (not administered)     Hospitalist was called for admission for Left foot cellulitis and diabetic foot ulcer  Review of Systems:    Pertinent positives include: left foot pain   Constitutional:  No weight loss, night sweats, Fevers, chills, fatigue, weight loss  HEENT:  No headaches, Difficulty swallowing,Tooth/dental problems,Sore throat,  No sneezing, itching, ear ache, nasal congestion, post nasal drip,  Cardio-vascular:  No chest pain, Orthopnea, PND, anasarca, dizziness, palpitations.no Bilateral lower extremity swelling  GI:  No heartburn, indigestion, abdominal pain, nausea, vomiting, diarrhea, change in bowel habits, loss of appetite, melena, blood in stool, hematemesis Resp:  no shortness of breath at rest. No dyspnea on exertion, No excess mucus, no productive cough, No non-productive cough, No coughing up of blood.No change in color of mucus.No  wheezing. Skin:  no rash or lesions. No jaundice GU:  no dysuria, change in color of urine, no urgency or frequency. No straining to urinate.  No flank pain.  Musculoskeletal:  No joint pain or no joint swelling. No decreased range of motion. No back pain.  Psych:  No change in mood or affect. No depression or anxiety. No memory loss.  Neuro: no localizing neurological complaints, no tingling, no weakness, no double vision, no gait abnormality, no slurred speech, no confusion  As per HPI otherwise 10 point review of systems negative.   Past Medical History: Past Medical History:  Diagnosis Date  . Adult ADHD   . Anaphylactic reaction   . Anxiety   . Asthma   . Bipolar  disorder (HCC)   . Complication of anesthesia    Per patient difficult intubation;  . Diabetes mellitus   . Difficult intubation    Per patient  . Drug overdose   . Heart valve disorder    s/p echocardiogram  . Hyperlipidemia   . Hypertension   . Morbidly obese (HCC)   . OSA (obstructive sleep apnea)   . Polysubstance abuse (HCC)   . Transient cerebral ischemia    Unknown   Past Surgical History:  Procedure Laterality Date  . CARPAL TUNNEL RELEASE    . NOSE SURGERY    . TOOTH EXTRACTION    . WISDOM TOOTH EXTRACTION       Social History:  Ambulatory   Independently     reports that he has been smoking cigarettes.  He has a 11.50 pack-year smoking history. he has never used smokeless tobacco. He reports that he does not drink alcohol or use drugs.  Allergies:   Allergies  Allergen Reactions  . Ace Inhibitors Anaphylaxis  . Atenolol Anaphylaxis  . Lisinopril Anaphylaxis  . Prednisone Hives  . Ibuprofen Itching, Swelling and Other (See Comments)    Hand/feet swelling   . Tylenol [Acetaminophen] Other (See Comments)    Reaction:  GI bleeding        Family History:   Family History  Problem Relation Age of Onset  . CAD Mother        Living  . Diabetes Mellitus II Mother   . Stroke Mother   . Hypertension Mother   . Congestive Heart Failure Mother   . Kidney disease Mother   . Fibromyalgia Mother   . Thyroid disease Mother   . Hyperlipidemia Mother   . Liver disease Mother   . Alcoholism Father 66       Deceased  . Arthritis Maternal Grandmother   . Congestive Heart Failure Maternal Grandmother   . Hypertension Maternal Grandmother   . Lung cancer Maternal Grandfather   . Colon cancer Maternal Aunt   . Stomach cancer Maternal Aunt   . Heart disease Other        Paternal & Maternal  . Crohn's disease Other   . Hypertension Other        Paternal & Maternal  . Hypertension Brother        x3  . Hypertension Sister        #1  . Bipolar disorder  Sister        #1  . ADD / ADHD Son        x3  . Bipolar disorder Son        x3  . Asperger's syndrome Son     Medications: Prior to Admission medications   Medication Sig Start  Date End Date Taking? Authorizing Provider  albuterol (PROVENTIL HFA;VENTOLIN HFA) 108 (90 Base) MCG/ACT inhaler Inhale 1-2 puffs into the lungs every 6 (six) hours as needed for wheezing or shortness of breath. 11/20/15  Yes Neva Seat, Tiffany, PA-C  alprazolam Prudy Feeler) 2 MG tablet Take 2 mg by mouth 3 (three) times daily.   Yes [provider]  amphetamine-dextroamphetamine (ADDERALL) 30 MG tablet Take 30-60 mg by mouth 2 (two) times daily. Pt takes two tablets in the morning and one in the evening.   Yes [provider]  aspirin 325 MG tablet Take 1 tablet (325 mg total) by mouth daily. For heart health 09/05/15  Yes Armandina Stammer I, NP  atorvastatin (LIPITOR) 20 MG tablet Take 20 mg by mouth at bedtime.   Yes [provider]  cephALEXin (KEFLEX) 500 MG capsule Take 1 capsule (500 mg total) 3 (three) times daily by mouth. 01/07/17  Yes Raeford Razor, MD  citalopram (CELEXA) 20 MG tablet Take 1 tablet (20 mg total) by mouth every morning. 10/15/15  Yes Storm Frisk, MD  cloNIDine (CATAPRES) 0.2 MG tablet Take 0.2 mg by mouth 2 (two) times daily.   Yes [provider]  COMBIVENT RESPIMAT 20-100 MCG/ACT AERS respimat Take 1 puff by mouth every 6 (six) hours as needed for wheezing or shortness of breath.    Yes [provider]  divalproex (DEPAKOTE) 500 MG DR tablet TAKE 1 TABLET BY MOUTH 2 TIMES DAILY. 03/03/16  Yes Jegede, Phylliss Blakes, MD  DULoxetine (CYMBALTA) 60 MG capsule Take 60 mg by mouth daily. 10/25/16  Yes [provider]  Fluticasone-Salmeterol (ADVAIR) 250-50 MCG/DOSE AEPB Inhale 1 puff into the lungs 2 (two) times daily.   Yes [provider]  furosemide (LASIX) 40 MG tablet Take 40 mg by mouth daily.   Yes [provider]  gabapentin  (NEURONTIN) 400 MG capsule Take 800 mg by mouth 3 (three) times daily.   Yes [provider]  metFORMIN (GLUCOPHAGE) 1000 MG tablet Take 1 tablet (1,000 mg total) by mouth 2 (two) times daily. 10/15/15  Yes Storm Frisk, MD  metoprolol tartrate (LOPRESSOR) 50 MG tablet Take 50 mg by mouth 2 (two) times daily.   Yes [provider]  omeprazole (PRILOSEC) 40 MG capsule Take 1 capsule (40 mg total) by mouth 2 (two) times daily. 10/15/15  Yes Storm Frisk, MD  oxycodone (ROXICODONE) 30 MG immediate release tablet Take 30 mg by mouth 5 (five) times daily as needed for pain.   Yes [provider]  QUEtiapine (SEROQUEL) 100 MG tablet Take 100-300 mg by mouth 3 (three) times daily. Take 1 tablet in the morning, take 3 tablets in the evening, and take 2 tablets at bedtime 10/25/16  Yes [provider]  ranitidine (ZANTAC) 150 MG tablet Take 150 mg by mouth 2 (two) times daily.    Yes [provider]  sitaGLIPtin (JANUVIA) 100 MG tablet Take 100 mg by mouth daily.   Yes [provider]  sulfamethoxazole-trimethoprim (BACTRIM DS,SEPTRA DS) 800-160 MG tablet Take 1 tablet 2 (two) times daily for 7 days by mouth. 01/07/17 01/14/17 Yes Raeford Razor, MD  tiZANidine (ZANAFLEX) 4 MG tablet Take 4 mg by mouth 3 (three) times daily as needed for muscle spasms.    Yes [provider]  traZODone (DESYREL) 50 MG tablet Take 1 tablet (50 mg total) by mouth at bedtime. For sleep Patient taking differently: Take 150 mg by mouth at bedtime. For sleep 10/15/15  Yes Storm FriskWright, Patrick E, MD  ursodiol (ACTIGALL) 300 MG capsule Take 300 mg by mouth 2 (two) times daily.   Yes [provider]  valsartan (DIOVAN) 160 MG tablet Take 160 mg by mouth daily.   Yes [provider]  testosterone cypionate (DEPOTESTOSTERONE CYPIONATE) 200 MG/ML injection Inject 200 mg into the muscle every 30 (thirty) days.     [provider]    Physical  Exam: Patient Vitals for the past 24 hrs:  BP Temp Temp src Pulse Resp SpO2 Height Weight  01/10/17 2059 (!) 163/122 (!) 97.4 F (36.3 C) Oral 89 20 100 % - -  01/10/17 1850 - - - - - - 6\' 2"  (1.88 m) (!) 163.3 kg (360 lb)  01/10/17 1841 (!) 161/88 98.2 F (36.8 C) Oral 93 18 100 % - -    1. General:  in No Acute distress  Disheveled Chronically ill -appearing 2. Psychological: Alert and Oriented 3. Head/ENT:    Dry Mucous Membranes                          Head Non traumatic, neck supple                            Poor Dentition 4. SKIN:  decreased Skin turgor,  Skin clean Dry erythematous and ulceration of the lateral foot noted as well as bilateral hands            5. Heart: Regular rate and rhythm soft Murmur, no Rub or gallop 6. Lungs:   no wheezes or crackles   7. Abdomen: Soft non-tender, Non distended   Obese bowel sounds present 8. Lower extremities: no clubbing, cyanosis, or edema 9. Neurologically Grossly intact, moving all 4 extremities equally 10. MSK: Normal range of motion   body mass index is 46.22 kg/m.  Labs on Admission:   Labs on Admission: I have personally reviewed following labs and imaging studies  CBC: Recent Labs  Lab 01/10/17 1951  WBC 8.8  NEUTROABS 5.2  HGB 13.9  HCT 41.0  MCV 92.6  PLT 223   Basic Metabolic Panel: Recent Labs  Lab 01/10/17 1951  NA 135  K 3.8  CL 100*  CO2 27  GLUCOSE 127*  BUN 8  CREATININE 0.87  CALCIUM 8.7*   GFR: Estimated Creatinine Clearance: 177.5 mL/min (by C-G formula based on SCr of 0.87 mg/dL). Liver Function Tests: Recent Labs  Lab 01/10/17 1951  AST 27  ALT 24  ALKPHOS 63  BILITOT 0.5  PROT 7.5  ALBUMIN 3.3*   No results for input(s): LIPASE, AMYLASE in the last 168 hours. No results for input(s): AMMONIA in the last 168 hours. Coagulation Profile: No results for input(s): INR, PROTIME in the last 168 hours. Cardiac Enzymes: No results for input(s): CKTOTAL, CKMB, CKMBINDEX,  TROPONINI in the last 168 hours. BNP (last 3 results) No results for input(s): PROBNP in the last 8760 hours. HbA1C: No results for input(s): HGBA1C in the last 72 hours. CBG: No results for input(s): GLUCAP in the last 168 hours. Lipid Profile: No results for input(s): CHOL, HDL, LDLCALC, TRIG, CHOLHDL, LDLDIRECT in the last 72 hours. Thyroid Function Tests: No results for input(s): TSH, T4TOTAL, FREET4, T3FREE, THYROIDAB in the last 72 hours. Anemia Panel: No results for input(s): VITAMINB12, FOLATE, FERRITIN, TIBC, IRON, RETICCTPCT in the last 72 hours. Urine analysis:  Sepsis Labs: @LABRCNTIP (procalcitonin:4,lacticidven:4) )No results found  for this or any previous visit (from the past 240 hour(s)).     UA  not ordered  Lab Results  Component Value Date   HGBA1C 6.0 (H) 01/04/2016    Estimated Creatinine Clearance: 177.5 mL/min (by C-G formula based on SCr of 0.87 mg/dL).  BNP (last 3 results) No results for input(s): PROBNP in the last 8760 hours.   ECG REPORT Not obtained  Filed Weights   01/10/17 1850  Weight: (!) 163.3 kg (360 lb)     Cultures:    Component Value Date/Time   SDES BLOOD LEFT ANTECUBITAL 11/11/2016 1022   SPECREQUEST  11/11/2016 1022    BOTTLES DRAWN AEROBIC AND ANAEROBIC Blood Culture adequate volume   CULT NO GROWTH 5 DAYS 11/11/2016 1022   REPTSTATUS 11/16/2016 FINAL 11/11/2016 1022     Radiological Exams on Admission: Dg Foot Complete Left  Result Date: 01/10/2017 CLINICAL DATA:  41 year old male with history of diabetes and hypertension presenting with left foot pain and swelling. EXAM: LEFT FOOT - COMPLETE 3+ VIEW COMPARISON:  Left foot radiograph dated 01/07/2017 FINDINGS: There is no acute fracture or dislocation. No significant arthritic changes. There is diffuse soft tissue swelling of the foot primarily over the forefoot. A tiny radiopaque focus is seen in the superficial soft tissues of the plantar aspect of the hindfoot as seen  on the prior radiograph. There is a focal area of soft tissue irregularity along the plantar aspect of the lateral foot seen on the oblique view which may represent a wound or ulcer. Clinical correlation is recommended. No soft tissue gas. IMPRESSION: 1. No acute fracture or dislocation. 2. Diffuse soft tissue swelling of the foot primarily over the forefoot. Clinical correlation is recommended. 3. Small radiopaque focus in the superficial soft tissues of the plantar hindfoot similar to prior radiograph. 4. Focal skin irregularity along the lateral plantar foot may represent an ulcer. Clinical correlation is recommended. No soft tissue gas. Electronically Signed   By: Elgie CollardArash  Radparvar M.D.   On: 01/10/2017 19:53    Chart has been reviewed    Assessment/Plan   41 y.o. male with medical history significant of HTN, HL, DM2 and IV drug abuse, Anxiety; Asthma; Bipolar disorder (HCC), Morbidly obese, OSA Admitted for Left foot cellulitis and diabetic foot ulcer  Present on Admission:   . Polysubstance dependence including opioid type drug, episodic abuse (HCC) IV drug use order social work consult check HIV status   . Diabetic foot ulcer (HCC)/Cellulitis in diabetic foot (HCC) - -admit per cellulitis protocol will       change to vancomycin given purulent discharge,     Given multiple sites of infection as well as diabetic foot ulcer will add broad-spectrum coverage for tonight  Zosyn        plain films showed:    no evidence of air  no evidence of osteomyelitis             Will obtain MRSA screening,      obtain blood cultures       Obtain ABIs     Given multiple sites of infection and history of IV drug use will obtain echogram     further antibiotic adjustment pending above results    Obtain sedimentation rate and CRP     would benefit from orthopedic consult in a.m.   Diabetes mellitus type 2 hold home medications order sliding scale   CHRonic pain syndrome - for now continue  home medications given acute pain but no  nausea vomiting continue oxycodone IR as prescribed prior . Essential hypertension, benign  - restart home medications patient is unsure what he actually takes at home this needs to be clarified   History of OSA currently reports he has not needed a C Pap since he lost weight  History of bipolar disorder/depression check Depakote level continue home medications currently denies any suicidal ideations  Other plan as per orders.  DVT prophylaxis:  SCD   Code Status:  FULL CODE    Family Communication:   Family not  at  Bedside    Disposition Plan:   To home once workup is complete and patient is stable                              Consults called: none  Admission status:   inpatient      Level of care       medical floor     I have spent a total of 56 min on this admission  Ciella Obi 01/10/2017, 10:12 PM    [facility]  Pager [phone]   after 2 AM please page floor coverage PA If 7AM-7PM, please contact the day team taking care of the patient  [email or web address]  Password [email or web address]

## 2017-01-10 NOTE — ED Triage Notes (Signed)
Pt left ER shortly after arrival.

## 2017-01-11 ENCOUNTER — Inpatient Hospital Stay (HOSPITAL_COMMUNITY): Payer: Medicare Other

## 2017-01-11 DIAGNOSIS — I5030 Unspecified diastolic (congestive) heart failure: Secondary | ICD-10-CM

## 2017-01-11 LAB — CBC
HEMATOCRIT: 38.6 % — AB (ref 39.0–52.0)
HEMOGLOBIN: 12.9 g/dL — AB (ref 13.0–17.0)
MCH: 30.6 pg (ref 26.0–34.0)
MCHC: 33.4 g/dL (ref 30.0–36.0)
MCV: 91.7 fL (ref 78.0–100.0)
Platelets: 215 10*3/uL (ref 150–400)
RBC: 4.21 MIL/uL — AB (ref 4.22–5.81)
RDW: 13.3 % (ref 11.5–15.5)
WBC: 6.9 10*3/uL (ref 4.0–10.5)

## 2017-01-11 LAB — COMPREHENSIVE METABOLIC PANEL
ALT: 21 U/L (ref 17–63)
ANION GAP: 6 (ref 5–15)
AST: 22 U/L (ref 15–41)
Albumin: 3.1 g/dL — ABNORMAL LOW (ref 3.5–5.0)
Alkaline Phosphatase: 51 U/L (ref 38–126)
BUN: 8 mg/dL (ref 6–20)
CHLORIDE: 105 mmol/L (ref 101–111)
CO2: 28 mmol/L (ref 22–32)
CREATININE: 0.89 mg/dL (ref 0.61–1.24)
Calcium: 8.9 mg/dL (ref 8.9–10.3)
GFR calc non Af Amer: >60 mL/min (ref >60)
Glucose, Bld: 143 mg/dL — ABNORMAL HIGH (ref 65–99)
POTASSIUM: 3.8 mmol/L (ref 3.5–5.1)
SODIUM: 139 mmol/L (ref 135–145)
Total Bilirubin: 0.6 mg/dL (ref 0.3–1.2)
Total Protein: 7.1 g/dL (ref 6.5–8.1)

## 2017-01-11 LAB — GLUCOSE, CAPILLARY
GLUCOSE-CAPILLARY: 130 mg/dL — AB (ref 65–99)
GLUCOSE-CAPILLARY: 151 mg/dL — AB (ref 65–99)
GLUCOSE-CAPILLARY: 130 mg/dL — AB (ref 65–99)
GLUCOSE-CAPILLARY: 120 mg/dL — AB (ref 65–99)

## 2017-01-11 LAB — MAGNESIUM: Magnesium: 2.3 mg/dL (ref 1.7–2.4)

## 2017-01-11 LAB — RAPID URINE DRUG SCREEN, HOSP PERFORMED
AMPHETAMINES: NOT DETECTED
BARBITURATES: NOT DETECTED
Benzodiazepines: POSITIVE — AB
Cocaine: NOT DETECTED
OPIATES: NOT DETECTED
TETRAHYDROCANNABINOL: NOT DETECTED

## 2017-01-11 LAB — TSH: TSH: 1.901 u[IU]/mL (ref 0.350–4.500)

## 2017-01-11 LAB — HEMOGLOBIN A1C
HEMOGLOBIN A1C: 5.7 % — AB (ref 4.8–5.6)
MEAN PLASMA GLUCOSE: 116.89 mg/dL

## 2017-01-11 LAB — PREALBUMIN: PREALBUMIN: 9.9 mg/dL — AB (ref 18–38)

## 2017-01-11 LAB — ECHOCARDIOGRAM COMPLETE
Height: 74 in
Weight: 5573.23 oz

## 2017-01-11 LAB — HIV ANTIBODY (ROUTINE TESTING W REFLEX): HIV Screen 4th Generation wRfx: NONREACTIVE

## 2017-01-11 LAB — C-REACTIVE PROTEIN: CRP: 5.4 mg/dL — ABNORMAL HIGH (ref <1.0)

## 2017-01-11 LAB — PHOSPHORUS: PHOSPHORUS: 3.3 mg/dL (ref 2.5–4.6)

## 2017-01-11 LAB — VALPROIC ACID LEVEL: Valproic Acid Lvl: <10 ug/mL — ABNORMAL LOW (ref 50.0–100.0)

## 2017-01-11 LAB — MRSA PCR SCREENING: MRSA BY PCR: POSITIVE — AB

## 2017-01-11 MED ORDER — PRO-STAT SUGAR FREE PO LIQD
30.0000 mL | Freq: Two times a day (BID) | ORAL | Status: DC
  Administered 2017-01-11 – 2017-01-14 (×6): 30 mL via ORAL
  Filled 2017-01-11 (×6): qty 30

## 2017-01-11 MED ORDER — PIPERACILLIN-TAZOBACTAM 3.375 G IVPB
3.3750 g | Freq: Three times a day (TID) | INTRAVENOUS | Status: DC
Start: 2017-01-11 — End: 2017-01-11
  Administered 2017-01-11: 3.375 g via INTRAVENOUS
  Filled 2017-01-11 (×2): qty 50

## 2017-01-11 MED ORDER — METRONIDAZOLE 500 MG PO TABS
500.0000 mg | ORAL_TABLET | Freq: Three times a day (TID) | ORAL | Status: DC
  Administered 2017-01-11 – 2017-01-14 (×10): 500 mg via ORAL
  Filled 2017-01-11 (×9): qty 1

## 2017-01-11 MED ORDER — PREMIER PROTEIN SHAKE
11.0000 [oz_av] | ORAL | Status: DC
  Administered 2017-01-11 – 2017-01-14 (×4): 11 [oz_av] via ORAL
  Filled 2017-01-11 (×5): qty 325.31

## 2017-01-11 MED ORDER — PERFLUTREN LIPID MICROSPHERE
INTRAVENOUS | Status: AC
  Filled 2017-01-11: qty 10

## 2017-01-11 MED ORDER — DEXTROSE 5 % IV SOLN
2.0000 g | Freq: Two times a day (BID) | INTRAVENOUS | Status: DC
  Administered 2017-01-11 – 2017-01-14 (×7): 2 g via INTRAVENOUS
  Filled 2017-01-11 (×8): qty 2

## 2017-01-11 MED ORDER — PERFLUTREN LIPID MICROSPHERE
1.0000 mL | INTRAVENOUS | Status: AC | PRN
  Administered 2017-01-11: 2 mL via INTRAVENOUS
  Filled 2017-01-11: qty 10

## 2017-01-11 NOTE — Progress Notes (Signed)
  Echocardiogram 2D Echocardiogram with Definity has been performed.  Nolon RodBrown, Tony 01/11/2017, 3:41 PM

## 2017-01-11 NOTE — Progress Notes (Signed)
Initial Nutrition Assessment  DOCUMENTATION CODES:   Morbid obesity  INTERVENTION:   -Community education officerrovide Premier Protein daily, each supplement provides 160 kcal and 30 grams of protein.  -Provide Prostat liquid protein PO 30 ml BID with meals, each supplement provides 100 kcal, 15 grams protein. -RD will continue to monitor  NUTRITION DIAGNOSIS:   Increased nutrient needs related to wound healing as evidenced by estimated needs.  GOAL:   Patient will meet greater than or equal to 90% of their needs  MONITOR:   PO intake, Supplement acceptance, Labs, Weight trends, Skin, I & O's  REASON FOR ASSESSMENT:   Consult Wound healing  ASSESSMENT:   41 y.o. male with medical history significant of HTN, HL, DM2 and IV drug abuse, Anxiety; Asthma; Bipolar disorder (HCC), Morbidly obese, OSA.Presented with 5 day history of left foot pain after stepping on something initially he was found to have his shoes stuck to his foot [facility]   Patient in room sleeping under the covers. Pt with breakfast tray at bedside. 100% completed.  Per chart review, pt goes days without eating. H/o IV drug use. RD to order prostat and Premier Protein daily for additional calories and protein to aid in wound healing of DM foot ulcer.  Pt has lost 29 lb since  7/14 (8% wt loss x 4 months, significant for time frame).   Medications: Lasix tablet daily, Protonix tablet daily, Senokot tablet BID Labs reviewed: CBGs: 117-130 HgbA1c: 5.7  NUTRITION - FOCUSED PHYSICAL EXAM:  Nutrition focused physical exam shows no sign of depletion of muscle mass or body fat.  Diet Order:  Diet Carb Modified Fluid consistency: Thin; Room service appropriate? Yes  EDUCATION NEEDS:   Not appropriate for education at this time  Skin:  Skin Assessment: Skin Integrity Issues: Skin Integrity Issues:: Diabetic Ulcer Diabetic Ulcer: foot  Last BM:  11/12  Height:   Ht Readings from Last 1 Encounters:   01/10/17 6\' 2"  (1.88 m)    Weight:   Wt Readings from Last 1 Encounters:  01/10/17 (!) 348 lb 5.2 oz (158 kg)    Ideal Body Weight:  86.3 kg  BMI:  Body mass index is 44.72 kg/m.  Estimated Nutritional Needs:   Kcal:  2300-2500  Protein:  110-120g  Fluid:  2.3L/day  Tilda FrancoLindsey Mohmmad Saleeby, MS, RD, LDN [facility] Clinical Dietitian Pager: [phone] After Hours Pager: [phone]

## 2017-01-11 NOTE — Progress Notes (Signed)
PROGRESS NOTE    Bryan Gallegos  BOF:[id number] DOB: 06-20-1975 DOA: 01/10/2017 PCP: System, Provider Not In    Brief Narrative:  41 y.o. male with medical history significant of HTN, HL, DM2 and IV drug abuse, Anxiety; Asthma; Bipolar disorder ([name]), Morbidly obese, OSA  Presented with 5 day history of left foot pain after stepping on something initially he was found to have his shoes stuck to his foot when he presented to [facility] ER He was given prescriptions for Keflex and discharged from ER 3 days ago but continues to have pain patient was noted walking outside in the rain barefoot.  He reports continues to have fevers in wound on the left foot is draining cottage cheese reports last IV drug use on the eighth injects into his right arm. No chest pain shortness of breath associated this or nausea vomiting and diarrhea also reports wounds on his arms.  Reports he have had some ulcers popping up in different areas including face, mouth hands.     Regarding pertinent Chronic problems: Patient with history of diabetes on Januvia and metformin. History of poorly controlled hypertension on losartan history of depression or suicidal ideation and attempts in the past currently denies any suicidal ideations  Assessment & Plan:   Active Problems:   OSA on CPAP   Essential hypertension, benign   Diabetes mellitus type II, controlled ([place])   Polysubstance dependence including opioid type drug, episodic abuse (HCC)   Chronic pain syndrome   Cellulitis in diabetic foot (HCC)   Diabetic foot ulcer (HCC)   Diabetic Foot Ulcer  Cellulitis:  ESR 48, CRP 5.4 Plain films with 11 mm radiopaque foreign body along ulnar aspect of R wrist  L foot with diffuse soft tissue swelling of the foot, small radiopaque focus in superficial soft tissues of plantar hindfoot.  Skin irregularity along lateral plantar foot.   He notes he thinks this infection is due to broken glass - continue vancomycin.   Will switch zosyn to cefepime and flagyl for now. [ ] follow up blood cultures [ ] follow up ABI's  [ ] with multiple sites of infection and hx IVDU, will get echo (will need TEE if + blood cultures) [ ] Will discuss with orthopedics regarding foreign bodies and LE wound Received tetanus shot 2017  History of IVDU: Only admits to cocaine use to me today.  Did not admit to other drug use to me, but per chart also notes opiates. [ ] HIV negative, add on hepatitis C [ ] social work [ ] encourage cessation  T2DM: notes he's no longer diabetic after losing Dashawn Gallegos lot of weight.   A1c 5.7 Continue SSI  Chronic Pain:  Continue home pain medications   Essential HTN: pt apparently not sure what he takes at home.  On lasix and clonidine here.  Valsatrtan not ordered.  Most recent BP appropriate.   Hx of OSA: no longer using CPAP  Hx Bipolar  Depression:  Continue depakote, celexa, adderall, xanax, seroquel  DVT prophylaxis: SCD Code Status: full  Family Communication: none at bedside Disposition Plan: pending improvement   Consultants:   orthopedics  Procedures: (Don't include imaging studies which can be auto populated. Include things that cannot be auto populated i.e. Echo, Carotid and venous dopplers, Foley, Bipap, HD, tubes/drains, wound vac, central lines etc)  none  Antimicrobials: (specify start and planned stop date. Auto populated tables are space occupying and do not give end dates) Anti-infectives (From admission, onward)  Start     Dose/Rate Route Frequency Ordered Stop   01/11/17 0900  metroNIDAZOLE (FLAGYL) tablet 500 mg     500 mg Oral Every 8 hours 01/11/17 0833     01/11/17 0600  vancomycin (VANCOCIN) 1,250 mg in sodium chloride 0.9 % 250 mL IVPB     1,250 mg 166.7 mL/hr over 90 Minutes Intravenous Every 8 hours 01/10/17 2144     01/11/17 0600  piperacillin-tazobactam (ZOSYN) IVPB 3.375 g  Status:  Discontinued     3.375 g 12.5 mL/hr over 240 Minutes Intravenous  Every 8 hours 01/10/17 2217 01/11/17 0046   01/11/17 0100  piperacillin-tazobactam (ZOSYN) IVPB 3.375 g  Status:  Discontinued     3.375 g 12.5 mL/hr over 240 Minutes Intravenous Every 8 hours 01/11/17 0046 01/11/17 0833   01/10/17 2215  piperacillin-tazobactam (ZOSYN) IVPB 3.375 g  Status:  Discontinued     3.375 g 100 mL/hr over 30 Minutes Intravenous  Once 01/10/17 2212 01/11/17 0046   01/10/17 2130  vancomycin (VANCOCIN) 2,000 mg in sodium chloride 0.9 % 500 mL IVPB     2,000 mg 250 mL/hr over 120 Minutes Intravenous  Once 01/10/17 2055 01/11/17 0111           Subjective: Sleepy.  Difficult to interview.  Notes injuries and infections he thinks are due to glass in his R hand and L foot.  Has other lesions, on back of R hand, he's not sure where this came from.   Objective: Vitals:   01/10/17 2328 01/11/17 0030 01/11/17 0508 01/11/17 0801  BP: (!) 153/117 134/70 123/72   Pulse: 86  83   Resp: 20  18   Temp: 98 F (36.7 C)  98.6 F (37 C)   TempSrc: Oral  Oral   SpO2: 100%  100% 92%  Weight: (!) 158 kg (348 lb 5.2 oz)     Height: 6' 2" (1.88 m)       Intake/Output Summary (Last 24 hours) at 01/11/2017 0829 Last data filed at 01/11/2017 0300 Gross per 24 hour  Intake 290 ml  Output 700 ml  Net -410 ml   Filed Weights   01/10/17 1850 01/10/17 2328  Weight: (!) 163.3 kg (360 lb) (!) 158 kg (348 lb 5.2 oz)    Examination:  General exam: Appears calm and comfortable  Respiratory system: Clear to auscultation. Respiratory effort normal. Cardiovascular system: S1 & S2 heard, RRR. No JVD, murmurs, rubs, gallops or clicks. No pedal edema. Gastrointestinal system: Abdomen is nondistended, soft and nontender. No organomegaly or masses felt. Normal bowel sounds heard. Central nervous system: Alert and oriented. No focal neurological deficits. Extremities: Symmetric 5 x 5 power. Skin: Ulcer to medial aspect of R hand.  L foot with erythema, TTP, ulcerated lesion on lateral  aspect of foot. Psychiatry: Judgement and insight appear normal. Mood & affect appropriate.           Data Reviewed: I have personally reviewed following labs and imaging studies  CBC: Recent Labs  Lab 01/10/17 1951 01/11/17 0420  WBC 8.8 6.9  NEUTROABS 5.2  --   HGB 13.9 12.9*  HCT 41.0 38.6*  MCV 92.6 91.7  PLT 223 638   Basic Metabolic Panel: Recent Labs  Lab 01/10/17 1951 01/11/17 0420  NA 135 139  K 3.8 3.8  CL 100* 105  CO2 27 28  GLUCOSE 127* 143*  BUN 8 8  CREATININE 0.87 0.89  CALCIUM 8.7* 8.9  MG  --  2.3 **Note De-identified vi Obfusction** PHOS  --  3.3   GFR: Estimted Cretinine Clernce: 170.3 mL/min (by [employer] formul bsed on SCr of 0.89 mg/dL). Liver Function Tests: Recent Lbs  Lb 01/10/17 1951 01/11/17 0420  ST 27 22  LT 24 21  LKPHOS 63 51  BILITOT 0.5 0.6  PROT 7.5 7.1  LBUMIN 3.3* 3.1*   No results for input(s): LIPSE, MYLSE in the lst 168 hours. No results for input(s): MMONI in the lst 168 hours. Cogultion Profile: No results for input(s): INR, PROTIME in the lst 168 hours. Crdic Enzymes: No results for input(s): CKTOTL, CKMB, CKMBINDEX, TROPONINI in the lst 168 hours. BNP (lst 3 results) No results for input(s): PROBNP in the lst 8760 hours. Hb1C: Recent Lbs    01/11/17 0420  HGB1C 5.7*   CBG: Recent Lbs  Lb 01/10/17 2325 01/11/17 0806  GLUCP 117* 130*   Lipid Profile: No results for input(s): CHOL, HDL, LDLCLC, TRIG, CHOLHDL, LDLDIRECT in the lst 72 hours. Thyroid Function Tests: Recent Lbs    01/11/17 0420  TSH 1.901   nemi Pnel: No results for input(s): VITMINB12, FOLTE, FERRITIN, TIBC, IRON, RETICCTPCT in the lst 72 hours. Sepsis Lbs: No results for input(s): PROCLCITON, LTICCIDVEN in the lst 168 hours.  Recent Results (from the pst 240 hour(s))  MRS PCR Screening     Sttus: bnorml   Collection Time: 01/11/17  3:12 M  Result Vlue Ref Rnge Sttus   MRS by PCR POSITIVE () NEGTIVE  Finl    Comment:        The GeneXpert MRS ssy (FD pproved for NSL specimens only), is one component of  comprehensive MRS coloniztion surveillnce progrm. It is not intended to dignose MRS infection nor to guide or monitor tretment for MRS infections. RESULT CLLED TO, RED BCK BY ND VERIFIED WITH: H RICHRD 0542 01/11/17  NVRRO          Rdiology Studies: Dg Hnd Complete Right  Result Dte: 01/10/2017 CLINICL DT:  Right hnd redness, infection EXM: RIGHT HND - COMPLETE 3+ VIEW COMPRISON:  06/07/2014 FINDINGS: Diffuse soft tissue swelling. No frcture or mllignment. No periostitis or bone destruction. 11 mm liner rdiopque foreign body within the soft tissues long the ulnr spect of the wrist. IMPRESSION: 1. No cute osseous bnormlity. 2. 11 mm liner rdiopque foreign body within the soft tissues long the ulnr spect of the wrist. Electroniclly Signed   By: Kim  Fujing M.D.   On: 01/10/2017 22:03   Dg Foot Complete Left  Result Dte: 01/10/2017 CLINICL DT:  41-yer-old mle with history of dibetes nd hypertension presenting with left foot pin nd swelling. EXM: LEFT FOOT - COMPLETE 3+ VIEW COMPRISON:  Left foot rdiogrph dted 01/07/2017 FINDINGS: There is no cute frcture or disloction. No significnt rthritic chnges. There is diffuse soft tissue swelling of the foot primrily over the forefoot.  tiny rdiopque focus is seen in the superficil soft tissues of the plntr spect of the hindfoot s seen on the prior rdiogrph. There is  focl re of soft tissue irregulrity long the plntr spect of the lterl foot seen on the oblique view which my represent  wound or ulcer. Clinicl correltion is recommended. No soft tissue gs. IMPRESSION: 1. No cute frcture or disloction. 2. Diffuse soft tissue swelling of the foot primrily over the forefoot. Clinicl correltion is recommended. 3. Smll rdiopque focus in the  superficil soft tissues of the plntr hindfoot similr to prior rdiogrph. 4. Focl skin irregulrity long the lterl plntr foot   may represent an ulcer. Clinical correlation is recommended. No soft tissue gas. Electronically Signed   By: Arash  Radparvar M.D.   On: 01/10/2017 19:53        Scheduled Meds: . alprazolam  2 mg Oral TID  . amphetamine-dextroamphetamine  60 mg Oral Q breakfast   And  . amphetamine-dextroamphetamine  30 mg Oral QPM  . atorvastatin  20 mg Oral QHS  . citalopram  20 mg Oral BH-q7a  . cloNIDine  0.2 mg Oral BID  . divalproex  500 mg Oral BID  . famotidine  10 mg Oral Daily  . furosemide  40 mg Oral Daily  . gabapentin  800 mg Oral TID  . insulin aspart  0-15 Units Subcutaneous TID WC  . insulin aspart  0-5 Units Subcutaneous QHS  . mometasone-formoterol  2 puff Inhalation BID  . pantoprazole  40 mg Oral Daily  . QUEtiapine  100 mg Oral q morning - 10a   And  . QUEtiapine  300 mg Oral QPM   And  . QUEtiapine  200 mg Oral QHS  . senna  1 tablet Oral BID  . sodium chloride flush  3 mL Intravenous Q12H  . traZODone  150 mg Oral QHS  . ursodiol  300 mg Oral BID   Continuous Infusions: . sodium chloride    . piperacillin-tazobactam (ZOSYN)  IV Stopped (01/11/17 0524)  . vancomycin 1,250 mg (01/11/17 0612)     LOS: 1 day    Time spent: over 30 minutes    Caldwell Powell, MD [facility] Pager [phone]  If 7PM-7AM, please contact night-coverage [email or web address] Password [email or web address] 01/11/2017, 8:29 AM   

## 2017-01-11 NOTE — Consult Note (Signed)
WOC Nurse wound consult note Reason for Consult: Full thickness chronic wound on left lateral foot. Area of cellulitis, erythema with scattered partial thickness wounds within on left LE, anterior aspect. Patient is sleeping heavily and while he rouses as I assess his foot and LE, he immediately returns to sleep. Wound type: Neuropathic Pressure Injury POA: NA Measurement: 1cm x 3cm x 0.2cm Wound bed: callous buildup in the periwound area may be obscuring true dimensions of wound. Improperly fitting shoe wear may be a source of repetitive trauma. Drainage (amount, consistency, odor) scant serous Periwound: white area of callous and maceration.  There is a 12cm x 16cm area of resolving erythema on the anterior aspect of the left LE.  This has been outlined with ink and the erythema is within the confines of the lines drawn. Dressing procedure/placement/frequency: I will ask bedside RN to place a twice daily saline dressing over the wound on the lateral left foot and to elevate the foot using a Prevalon Boot. Patient may benefit from follow up with an outpatient wound care physician or wound center. WOC nursing team will not follow, but will remain available to this patient, the nursing and medical teams.  Please re-consult if needed. Thanks, Ladona MowLaurie Curtisha Bendix, MSN, RN, GNP, Hans EdenCWOCN, CWON-AP, FAAN  Pager# [phone]

## 2017-01-11 NOTE — Progress Notes (Signed)
OT Cancellation Note  Patient Details Name: Bryan DuosMitchell L Semmel MRN: [id number] DOB: 05/31/1975   Cancelled Treatment:    Reason Eval/Treat Not Completed: Fatigue/lethargy limiting ability to participate  Will check on pt next day   [name], Neo Yepiz D 01/11/2017, 1:50 PM

## 2017-01-11 NOTE — Consult Note (Signed)
Reason for Consult: Concern for infection right hand and left foot Referring Physician: Hospitalists  Bryan Gallegos is an 41 y.o. male.  HPI: The patient is severely somnolent and unable to give any significant history. By way of the chart it appears that he stepped on something several days ago and went to the [facility] ER. He ultimately was treated with oral antibiotics and maybe had some worsening of pain and erythema and [facility] evaluated and admitted by the hospitalists. We are consult in for evaluation of right hand and left foot. I am not able to get any significant history of right hand issues from the patient because of his somnolence.  Past Medical History:  Diagnosis Date  . Adult ADHD   . Anaphylactic reaction   . Anxiety   . Asthma   . Bipolar disorder ([place])   . Complication of anesthesia    Per patient difficult intubation;  . Diabetes mellitus   . Difficult intubation    Per patient  . Drug overdose   . Heart valve disorder    s/p echocardiogram  . Hyperlipidemia   . Hypertension   . Morbidly obese ([place])   . OSA (obstructive sleep apnea)   . Polysubstance abuse ([place])   . Transient cerebral ischemia    Unknown    Past Surgical History:  Procedure Laterality Date  . CARPAL TUNNEL RELEASE    . NOSE SURGERY    . TOOTH EXTRACTION    . WISDOM TOOTH EXTRACTION      Family History  Problem Relation Age of Onset  . CAD Mother        Living  . Diabetes Mellitus II Mother   . Stroke Mother   . Hypertension Mother   . Congestive Heart Failure Mother   . Kidney disease Mother   . Fibromyalgia Mother   . Thyroid disease Mother   . Hyperlipidemia Mother   . Liver disease Mother   . Alcoholism Father 73       Deceased  . Arthritis Maternal Grandmother   . Congestive Heart Failure Maternal Grandmother   . Hypertension Maternal Grandmother   . Lung cancer Maternal Grandfather   . Colon cancer Maternal Aunt   . Stomach  cancer Maternal Aunt   . Heart disease Other        Paternal & Maternal  . Crohn's disease Other   . Hypertension Other        Paternal & Maternal  . Hypertension Brother        x3  . Hypertension Sister        #1  . Bipolar disorder Sister        #1  . ADD / ADHD Son        x3  . Bipolar disorder Son        x3  . Asperger's syndrome Son     Social History:  reports that he has been smoking cigarettes.  He has a 11.50 pack-year smoking history. he has never used smokeless tobacco. He reports that he does not drink alcohol or use drugs.  Allergies:  Allergies  Allergen Reactions  . Ace Inhibitors Anaphylaxis  . Atenolol Anaphylaxis  . Lisinopril Anaphylaxis  . Prednisone Hives  . Ibuprofen Itching, Swelling and Other (See Comments)    Hand/feet swelling   . Tylenol [Acetaminophen] Other (See Comments)    Reaction:  GI bleeding     Medications: I have reviewed the  patient's current medications.  Results for orders placed or performed during the hospital encounter of 01/10/17 (from the past 48 hour(s))  Comprehensive metabolic panel     Status: Abnormal   Collection Time: 01/10/17  7:51 PM  Result Value Ref Range   Sodium 135 135 - 145 mmol/L   Potassium 3.8 3.5 - 5.1 mmol/L   Chloride 100 (L) 101 - 111 mmol/L   CO2 27 22 - 32 mmol/L   Glucose, Bld 127 (H) 65 - 99 mg/dL   BUN 8 6 - 20 mg/dL   Creatinine, Ser 0.87 0.61 - 1.24 mg/dL   Calcium 8.7 (L) 8.9 - 10.3 mg/dL   Total Protein 7.5 6.5 - 8.1 g/dL   Albumin 3.3 (L) 3.5 - 5.0 g/dL   AST 27 15 - 41 U/L   ALT 24 17 - 63 U/L   Alkaline Phosphatase 63 38 - 126 U/L   Total Bilirubin 0.5 0.3 - 1.2 mg/dL   GFR calc non Af Amer >60 >60 mL/min   GFR calc Af Amer >60 >60 mL/min    Comment: (NOTE) The eGFR has been calculated using the CKD EPI equation. This calculation has not been validated in all clinical situations. eGFR's persistently <60 mL/min signify possible Chronic Kidney Disease.    Anion gap 8 5 - 15  CBC  with Differential     Status: None   Collection Time: 01/10/17  7:51 PM  Result Value Ref Range   WBC 8.8 4.0 - 10.5 K/uL   RBC 4.43 4.22 - 5.81 MIL/uL   Hemoglobin 13.9 13.0 - 17.0 g/dL   HCT 41.0 39.0 - 52.0 %   MCV 92.6 78.0 - 100.0 fL   MCH 31.4 26.0 - 34.0 pg   MCHC 33.9 30.0 - 36.0 g/dL   RDW 13.2 11.5 - 15.5 %   Platelets 223 150 - 400 K/uL   Neutrophils Relative % 59 %   Neutro Abs 5.2 1.7 - 7.7 K/uL   Lymphocytes Relative 32 %   Lymphs Abs 2.8 0.7 - 4.0 K/uL   Monocytes Relative 7 %   Monocytes Absolute 0.6 0.1 - 1.0 K/uL   Eosinophils Relative 2 %   Eosinophils Absolute 0.2 0.0 - 0.7 K/uL   Basophils Relative 0 %   Basophils Absolute 0.0 0.0 - 0.1 K/uL  Culture, blood (routine x 2)     Status: None (Preliminary result)   Collection Time: 01/10/17  7:51 PM  Result Value Ref Range   Specimen Description BLOOD LEFT ARM    Special Requests      BOTTLES DRAWN AEROBIC AND ANAEROBIC Blood Culture adequate volume   Culture      NO GROWTH < 12 HOURS Performed at [facility] [address], [place], [place] [address]    Report Status PENDING   C-reactive protein     Status: Abnormal   Collection Time: 01/10/17  7:57 PM  Result Value Ref Range   CRP 5.4 (H) <1.0 mg/dL    Comment: Performed at [facility], [place] [address] [place], [place] [address]  Sedimentation rate     Status: Abnormal   Collection Time: 01/10/17  7:57 PM  Result Value Ref Range   Sed Rate 48 (H) 0 - 16 mm/hr  Valproic acid level     Status: Abnormal   Collection Time: 01/10/17 11:25 PM  Result Value Ref Range   Valproic Acid Lvl <10 (L) 50.0 - 100.0 ug/mL    Comment:  RESULTS CONFIRMED BY MANUAL DILUTION  Glucose, capillary     Status: Abnormal   Collection Time: 01/10/17 11:25 PM  Result Value Ref Range   Glucose-Capillary 117 (H) 65 - 99 mg/dL  MRSA PCR Screening     Status: Abnormal   Collection Time: 01/11/17  3:12 AM  Result Value Ref Range   MRSA by PCR POSITIVE (A) NEGATIVE     Comment:        The GeneXpert MRSA Assay (FDA approved for NASAL specimens only), is one component of a comprehensive MRSA colonization surveillance program. It is not intended to diagnose MRSA infection nor to guide or monitor treatment for MRSA infections. RESULT CALLED TO, READ BACK BY AND VERIFIED WITH: H RICHARD 0542 01/11/17 A NAVARRO   Hemoglobin A1c     Status: Abnormal   Collection Time: 01/11/17  4:20 AM  Result Value Ref Range   Hgb A1c MFr Bld 5.7 (H) 4.8 - 5.6 %    Comment: (NOTE) Pre diabetes:          5.7%-6.4% Diabetes:              >6.4% Glycemic control for   <7.0% adults with diabetes    Mean Plasma Glucose 116.89 mg/dL    Comment: Performed at [facility] [address] [place], [place] [address]  Prealbumin     Status: Abnormal   Collection Time: 01/11/17  4:20 AM  Result Value Ref Range   Prealbumin 9.9 (L) 18 - 38 mg/dL    Comment: Performed at [facility] [address] [place], [place] [address]  Magnesium     Status: None   Collection Time: 01/11/17  4:20 AM  Result Value Ref Range   Magnesium 2.3 1.7 - 2.4 mg/dL  Phosphorus     Status: None   Collection Time: 01/11/17  4:20 AM  Result Value Ref Range   Phosphorus 3.3 2.5 - 4.6 mg/dL  TSH     Status: None   Collection Time: 01/11/17  4:20 AM  Result Value Ref Range   TSH 1.901 0.350 - 4.500 uIU/mL    Comment: Performed by a 3rd Generation assay with a functional sensitivity of <=0.01 uIU/mL.  Comprehensive metabolic panel     Status: Abnormal   Collection Time: 01/11/17  4:20 AM  Result Value Ref Range   Sodium 139 135 - 145 mmol/L   Potassium 3.8 3.5 - 5.1 mmol/L   Chloride 105 101 - 111 mmol/L   CO2 28 22 - 32 mmol/L   Glucose, Bld 143 (H) 65 - 99 mg/dL   BUN 8 6 - 20 mg/dL   Creatinine, Ser 0.89 0.61 - 1.24 mg/dL   Calcium 8.9 8.9 - 10.3 mg/dL   Total Protein 7.1 6.5 - 8.1 g/dL   Albumin 3.1 (L) 3.5 - 5.0 g/dL   AST 22 15 - 41 U/L   ALT 21 17 - 63 U/L    Alkaline Phosphatase 51 38 - 126 U/L   Total Bilirubin 0.6 0.3 - 1.2 mg/dL   GFR calc non Af Amer >60 >60 mL/min   GFR calc Af Amer >60 >60 mL/min    Comment: (NOTE) The eGFR has been calculated using the CKD EPI equation. This calculation has not been validated in all clinical situations. eGFR's persistently <60 mL/min signify possible Chronic Kidney Disease.    Anion gap 6 5 - 15  CBC     Status: Abnormal   Collection Time: 01/11/17  4:20 AM  Result Value Ref Range   WBC 6.9 4.0 - 10.5 K/uL   RBC 4.21 (L) 4.22 - 5.81 MIL/uL   Hemoglobin 12.9 (L) 13.0 - 17.0 g/dL   HCT 38.6 (L) 39.0 - 52.0 %   MCV 91.7 78.0 - 100.0 fL   MCH 30.6 26.0 - 34.0 pg   MCHC 33.4 30.0 - 36.0 g/dL   RDW 13.3 11.5 - 15.5 %   Platelets 215 150 - 400 K/uL  Glucose, capillary     Status: Abnormal   Collection Time: 01/11/17  8:06 AM  Result Value Ref Range   Glucose-Capillary 130 (H) 65 - 99 mg/dL  Glucose, capillary     Status: Abnormal   Collection Time: 01/11/17 12:01 PM  Result Value Ref Range   Glucose-Capillary 151 (H) 65 - 99 mg/dL   Comment 1 Notify RN    Comment 2 Document in Chart     Dg Hand Complete Right  Result Date: 01/10/2017 CLINICAL DATA:  Right hand redness, infection EXAM: RIGHT HAND - COMPLETE 3+ VIEW COMPARISON:  06/07/2014 FINDINGS: Diffuse soft tissue swelling. No fracture or malalignment. No periostitis or bone destruction. 11 mm linear radiopaque foreign body within the soft tissues along the ulnar aspect of the wrist. IMPRESSION: 1. No acute osseous abnormality. 2. 11 mm linear radiopaque foreign body within the soft tissues along the ulnar aspect of the wrist. Electronically Signed   By: Donavan Foil M.D.   On: 01/10/2017 22:03   Dg Foot Complete Left  Result Date: 01/10/2017 CLINICAL DATA:  41 year old male with history of diabetes and hypertension presenting with left foot pain and swelling. EXAM: LEFT FOOT - COMPLETE 3+ VIEW COMPARISON:  Left foot radiograph dated  01/07/2017 FINDINGS: There is no acute fracture or dislocation. No significant arthritic changes. There is diffuse soft tissue swelling of the foot primarily over the forefoot. A tiny radiopaque focus is seen in the superficial soft tissues of the plantar aspect of the hindfoot as seen on the prior radiograph. There is a focal area of soft tissue irregularity along the plantar aspect of the lateral foot seen on the oblique view which may represent a wound or ulcer. Clinical correlation is recommended. No soft tissue gas. IMPRESSION: 1. No acute fracture or dislocation. 2. Diffuse soft tissue swelling of the foot primarily over the forefoot. Clinical correlation is recommended. 3. Small radiopaque focus in the superficial soft tissues of the plantar hindfoot similar to prior radiograph. 4. Focal skin irregularity along the lateral plantar foot may represent an ulcer. Clinical correlation is recommended. No soft tissue gas. Electronically Signed   By: Anner Crete M.D.   On: 01/10/2017 19:53    ROS  ROS: I have reviewed the patient's review of systems thoroughly and there are no positive responses as relates to the HPI. Blood pressure 123/72, pulse 83, temperature 98.6 F (37 C), temperature source Oral, resp. rate 18, height '6\' 2"'$  (1.88 m), weight (!) 158 kg (348 lb 5.2 oz), SpO2 92 %. Physical Exam Well-developed well-nourished patient in no acute distress. Alert and oriented x3 HEENT:within normal limits Cardiac: Regular rate and rhythm Pulmonary: Lungs clear to auscultation Abdomen: Soft and nontender.  Normal active bowel sounds  Musculoskeletal: (Right hand: No tenderness to palpation over the ulnar aspect where this suppose it foreign body is. There is no erythema over that area. There is diffuse swelling of both hands. There is some increased erythema over the right than left hand but this is certainly  diffuse. Left leg has significant cellulitis over the distal aspect. There is mild  erythema over the foot. There is some tenderness to palpation over this area of small open wound. Assessment/Plan: 41 year old morbidly obese diabetic with bipolar disorder and history of anxiety and drug abuse review presents with left foot pain in the setting of some previous potentially penetrating trauma. He has been admitted for 24 hours on IV antibiotics. He does not have an elevated white blood cell count and his sugars are reasonably well controlled in the mid 100s. There was some concern about his right hand as well and x-rays showed a foreign body and we are consult in for evaluation of these problems.//At this point I don't feel that the patient needs any significant treatment for his right upper extremity. He could use some compressive wrap to help decrease edema and the upper extremity and elevation of the upper extremity could help as well. He certainly does not need any surgical excision of this foreign body at this point. My guess is is been there for a long time.  In terms of lower extremity think elevation and IV antibiotics as he most appropriate treatment plan. Once his cellulitis and drainage has resolved completely if he would like on delayed basis we could take the small foreign body out but I think it is unlikely to need surgical attention on this hospitalization. I will check on him every couple of days to make sure that there is been no change in his overall situation and certainly if there appears to be some change I can always be reached at [phone].  Earlyn Sylvan L 01/11/2017, 12:26 PM

## 2017-01-11 NOTE — Evaluation (Signed)
Physical Therapy Evaluation Patient Details Name: Bryan Gallegos MRN: [id number] DOB: 10/10/1975 Today's Date: 01/11/2017   History of Present Illness  41 yo male admitted with diabetic foot ulcer, cellultis. Hx of morbid obesity, chronic pain, polysubstance abuse, DM, biplor d/o, asthma    Clinical Impression  On eval, pt was Min guard assist for mobility. He walked ~60 feet without an assistive device. Pt c/o L foot pain with ambulation. Gait is antalgic. Discussed d/c plan-pt plans to return home. He is independent at baseline. Will follow and progress activity as tolerated.     Follow Up Recommendations No PT follow up    Equipment Recommendations  None recommended by PT    Recommendations for Other Services       Precautions / Restrictions Precautions Precautions: Fall Restrictions Weight Bearing Restrictions: No      Mobility  Bed Mobility Overal bed mobility: Modified Independent                Transfers Overall transfer level: Modified independent                  Ambulation/Gait Ambulation/Gait assistance: Min guard Ambulation Distance (Feet): 60 Feet Assistive device: None Gait Pattern/deviations: Antalgic     General Gait Details: distance limited due to pain  Stairs            Wheelchair Mobility    Modified Rankin (Stroke Patients Only)       Balance                                             Pertinent Vitals/Pain Pain Assessment: Faces Faces Pain Scale: Hurts even more Pain Location: foot with ambulation Pain Intervention(s): Limited activity within patient's tolerance;Repositioned    Home Living Family/patient expects to be discharged to:: Private residence Living Arrangements: Parent Available Help at Discharge: Family Type of Home: House         Home Equipment: None      Prior Function Level of Independence: Independent               Hand Dominance         Extremity/Trunk Assessment   Upper Extremity Assessment Upper Extremity Assessment: Defer to OT evaluation    Lower Extremity Assessment Lower Extremity Assessment: Generalized weakness    Cervical / Trunk Assessment Cervical / Trunk Assessment: Normal  Communication   Communication: No difficulties  Cognition Arousal/Alertness: Awake/alert Behavior During Therapy: WFL for tasks assessed/performed Overall Cognitive Status: Within Functional Limits for tasks assessed                                        General Comments      Exercises     Assessment/Plan    PT Assessment Patient needs continued PT services  PT Problem List Decreased mobility;Decreased activity tolerance;Pain       PT Treatment Interventions Gait training;DME instruction;Functional mobility training;Therapeutic activities;Therapeutic exercise    PT Goals (Current goals can be found in the Care Plan section)  Acute Rehab PT Goals Patient Stated Goal: less pain PT Goal Formulation: With patient Time For Goal Achievement: 01/25/17 Potential to Achieve Goals: Good    Frequency Min 3X/week   Barriers to discharge        Co-evaluation  AM-PAC PT "6 Clicks" Daily Activity  Outcome Measure Difficulty turning over in bed (including adjusting bedclothes, sheets and blankets)?: None Difficulty moving from lying on back to sitting on the side of the bed? : None Difficulty sitting down on and standing up from a chair with arms (e.g., wheelchair, bedside commode, etc,.)?: None Help needed moving to and from a bed to chair (including a wheelchair)?: None Help needed walking in hospital room?: A Little Help needed climbing 3-5 steps with a railing? : A Little 6 Click Score: 22    End of Session Equipment Utilized During Treatment: Gait belt Activity Tolerance: Patient tolerated treatment well Patient left: in bed;with call bell/phone within reach;with bed alarm set    PT Visit Diagnosis: Difficulty in walking, not elsewhere classified (R26.2);Pain Pain - Right/Left: Left Pain - part of body: Ankle and joints of foot    Time: [id number] PT Time Calculation (min) (ACUTE ONLY): 13 min   Charges:   PT Evaluation $PT Eval Moderate Complexity: 1 Mod     PT G Codes:          Rebeca AlertJannie Dereke Neumann, MPT Pager: [phone]

## 2017-01-11 NOTE — Progress Notes (Signed)
Pharmacy Antibiotic Note  Bryan DuosMitchell L Gallegos is a 41 y.o. male admitted on 01/10/2017 with cellulitis.  Pharmacy has been consulted for vancomycin and zosyn dosing. Zosyn changed to cefepime and Metronidazole 11/13  Plan:  Continue Vancomycin 1250 mg IV q8h for est AUC 457  Cefepime 2gm IV q12h  Daily SCr while on vancomycin (for obesity and q8h dosing)  Check vancomycin peak and trough - check levels 11/15am  Await culture results  Height: 6\' 2"  (188 cm) Weight: (!) 348 lb 5.2 oz (158 kg) IBW/kg (Calculated) : 82.2  Temp (24hrs), Avg:98.1 F (36.7 C), Min:97.4 F (36.3 C), Max:98.6 F (37 C)  Recent Labs  Lab 01/10/17 1951 01/11/17 0420  WBC 8.8 6.9  CREATININE 0.87 0.89    Estimated Creatinine Clearance: 170.3 mL/min (by C-G formula based on SCr of 0.89 mg/dL).    Allergies  Allergen Reactions  . Ace Inhibitors Anaphylaxis  . Atenolol Anaphylaxis  . Lisinopril Anaphylaxis  . Prednisone Hives  . Ibuprofen Itching, Swelling and Other (See Comments)    Hand/feet swelling   . Tylenol [Acetaminophen] Other (See Comments)    Reaction:  GI bleeding     Antimicrobials this admission:  11/12 vanc>> 11/12 zosyn>> Dose adjustments this admission:   Microbiology results:  11/12 BCx:  11/12 MRSA PCR: positive  Thank you for allowing pharmacy to be a part of this patient's care.  Juliette Alcideustin Zeigler, PharmD, BCPS.   Pager: [phone] 01/11/2017 8:57 AM

## 2017-01-12 ENCOUNTER — Inpatient Hospital Stay (HOSPITAL_COMMUNITY): Payer: Medicare Other

## 2017-01-12 ENCOUNTER — Other Ambulatory Visit: Payer: Self-pay

## 2017-01-12 DIAGNOSIS — I5032 Chronic diastolic (congestive) heart failure: Secondary | ICD-10-CM

## 2017-01-12 DIAGNOSIS — F3111 Bipolar disorder, current episode manic without psychotic features, mild: Secondary | ICD-10-CM

## 2017-01-12 DIAGNOSIS — G4733 Obstructive sleep apnea (adult) (pediatric): Secondary | ICD-10-CM

## 2017-01-12 DIAGNOSIS — R400 Somnolence: Secondary | ICD-10-CM

## 2017-01-12 DIAGNOSIS — L0390 Cellulitis, unspecified: Secondary | ICD-10-CM

## 2017-01-12 DIAGNOSIS — E118 Type 2 diabetes mellitus with unspecified complications: Secondary | ICD-10-CM

## 2017-01-12 DIAGNOSIS — F1910 Other psychoactive substance abuse, uncomplicated: Secondary | ICD-10-CM

## 2017-01-12 DIAGNOSIS — E662 Morbid (severe) obesity with alveolar hypoventilation: Secondary | ICD-10-CM

## 2017-01-12 LAB — BASIC METABOLIC PANEL
ANION GAP: 6 (ref 5–15)
BUN: 12 mg/dL (ref 6–20)
CO2: 27 mmol/L (ref 22–32)
Calcium: 8.9 mg/dL (ref 8.9–10.3)
Chloride: 110 mmol/L (ref 101–111)
Creatinine, Ser: 0.93 mg/dL (ref 0.61–1.24)
Glucose, Bld: 131 mg/dL — ABNORMAL HIGH (ref 65–99)
POTASSIUM: 4.1 mmol/L (ref 3.5–5.1)
Sodium: 143 mmol/L (ref 135–145)

## 2017-01-12 LAB — GLUCOSE, CAPILLARY
GLUCOSE-CAPILLARY: 122 mg/dL — AB (ref 65–99)
GLUCOSE-CAPILLARY: 216 mg/dL — AB (ref 65–99)
GLUCOSE-CAPILLARY: 119 mg/dL — AB (ref 65–99)
Glucose-Capillary: 186 mg/dL — ABNORMAL HIGH (ref 65–99)

## 2017-01-12 LAB — CBC
HCT: 41.2 % (ref 39.0–52.0)
Hemoglobin: 13.7 g/dL (ref 13.0–17.0)
MCH: 30.8 pg (ref 26.0–34.0)
MCHC: 33.3 g/dL (ref 30.0–36.0)
MCV: 92.6 fL (ref 78.0–100.0)
PLATELETS: 218 10*3/uL (ref 150–400)
RBC: 4.45 MIL/uL (ref 4.22–5.81)
RDW: 13.4 % (ref 11.5–15.5)
WBC: 7.1 10*3/uL (ref 4.0–10.5)

## 2017-01-12 LAB — BLOOD GAS, ARTERIAL
ACID-BASE EXCESS: 0.7 mmol/L (ref 0.0–2.0)
BICARBONATE: 24.1 mmol/L (ref 20.0–28.0)
DRAWN BY: 257881
FIO2: 21
O2 SAT: 94.5 %
PH ART: 7.436 (ref 7.350–7.450)
Patient temperature: 98.6
pCO2 arterial: 36.5 mmHg (ref 32.0–48.0)
pO2, Arterial: 74.4 mmHg — ABNORMAL LOW (ref 83.0–108.0)

## 2017-01-12 LAB — HEPATITIS C ANTIBODY

## 2017-01-12 MED ORDER — ALPRAZOLAM 1 MG PO TABS
1.0000 mg | ORAL_TABLET | Freq: Every evening | ORAL | Status: DC | PRN
  Administered 2017-01-12: 1 mg via ORAL
  Filled 2017-01-12: qty 2

## 2017-01-12 MED ORDER — TIZANIDINE HCL 2 MG PO TABS
1.0000 mg | ORAL_TABLET | Freq: Three times a day (TID) | ORAL | Status: DC | PRN
  Administered 2017-01-12: 1 mg via ORAL
  Filled 2017-01-12 (×2): qty 0.5

## 2017-01-12 MED ORDER — OXYCODONE HCL 5 MG PO TABS: 10.0000 mg | ORAL_TABLET | Freq: Four times a day (QID) | ORAL | Status: DC | PRN

## 2017-01-12 MED ORDER — OXYCODONE HCL 5 MG PO TABS
10.0000 mg | ORAL_TABLET | ORAL | Status: DC | PRN
  Administered 2017-01-12 – 2017-01-14 (×9): 10 mg via ORAL
  Filled 2017-01-12 (×9): qty 2

## 2017-01-12 NOTE — Progress Notes (Signed)
VASCULAR LAB PRELIMINARY  ARTERIAL  ABI completed:    RIGHT    LEFT    PRESSURE WAVEFORM  PRESSURE WAVEFORM  BRACHIAL 143 Triphasic BRACHIAL IV Triphasic  DP 156 Triphasic DP 152 Triphasic  PT 159 Triphasic PT 181 Triphasic    RIGHT LEFT  ABI 1.11 1.27   ABIs and Doppler waveforms indicate normal arterial flow bilaterally at rest.  Quinisha Mould, RVS 01/12/2017, 9:22 AM

## 2017-01-12 NOTE — Progress Notes (Signed)
OT Cancellation Note  Patient Details Name: Bryan Gallegos MRN: [id number] DOB: 11/04/1975   Cancelled Treatment:    Reason Eval/Treat Not Completed: Other (comment) Per RN pt is I with ADL activity.  Pt just returned from smoking out in [place].   Pts rooms smells of smoke. Will sign off  Quintarius Ferns, [place] KungLorraine D 01/12/2017, 3:23 PM

## 2017-01-12 NOTE — Progress Notes (Signed)
Patient refuses CPAP 

## 2017-01-12 NOTE — Progress Notes (Signed)
CSW consulted to assess substance use issues. Attempted to meet with pt- sleeping soundly and would not arouse. Pt's son in room also and advised pt had been sleeping for some time after receiving medications and asked that CSW return later.  Ilean SkillMeghan Magdelene Ruark, MSW, LCSW Clinical Social Work 01/12/2017 [phone]

## 2017-01-12 NOTE — Progress Notes (Signed)
PROGRESS NOTE    Bryan Gallegos  ZOX:[id number] DOB: 07/06/1975 DOA: 01/10/2017 PCP: System, Provider Not In     Brief Narrative:  Bryan Gallegos is a 41 y.o. male  PMHx TIA, IV Drug Abuse, Drug Overdose, HTN, Anxiety, Bipolar Disorder, DM Type 2,  Asthma; OSA/OHS,/ Morbidly obese   Presented with 5 day history of left foot pain after stepping on something initially he was found to have his shoes stuck to his foot when [facility] He was given prescriptions for Keflex and discharged from ER 3 days ago but continues to have pain patient was noted walking outside in the rain barefoot.  He reports continues to have fevers in wound on the left foot is draining cottage cheese reports last IV drug use on the eighth injects into his right arm. No chest pain shortness of breath associated this or nausea vomiting and diarrhea also reports wounds on his arms.   Reports he have had some ulcers popping up in different areas including face, mouth hands.      Subjective: 11/14 A/O 4 however admits confused cannot remember conversations with caregivers. His son Bryan Junes(Brandon) who was present also states that father was asleep yesterday and extremely difficult to arouse. In addition states father has been extremely confused during his hospitalization. Patient admits to IV drug abuse. Negative CP, negative SOB. Negative abdominal pain, negative N/V. Positive LLE pain described as throbbing. Bryan JunesBrandon states that his mother died from an infection (sounds like spinal abscess) a couple months ago also IV drug abuser and he is concerned that father may have sustained disease process as they shared needles.     Assessment & Plan:   Active Problems:   OSA on CPAP   Essential hypertension, benign   Diabetes mellitus type II, controlled (HCC)   Polysubstance dependence including opioid type drug, episodic abuse (HCC)   Chronic pain syndrome   Cellulitis in diabetic foot (HCC)   Diabetic  foot ulcer (HCC)   Polysubstance abuse/IV Drug Abuse -IV drug abuser who shared needles with his wife. Per son Bryan JunesBrandon mother passed couple months ago secondary to what sounds like spinal abscess most likely secondary to IV drug use per son. -HIV negative -Obtain Acute Hepatitis Panel,   Altered mental status/Somnolence -Per multiple physicians/RN notes patient somnolent to the point of not being able to answer questions or participate with examination. -Obtain ABG. Patient has not been on BiPAP since admission is CO2 retention 0.8 part in his altered mental status? -11/14 Decrease Xanax 2mg  TID---> 1 mg QHS PRN -11/14 decrease OxyIR 30mg  QID PRN---> 10 mg q 4 hr PRN -11/14 decrease Zanaflex 4 mg TID PRN---> 1 mg TID PRN  Bipolar, WJXBJYN@@@@@Anxiety@@@@@ -Per patient has not been taking prescribed psychiatric medication. Have consulted psychiatry for help with restarting appropriate medication and doses, will see in a.m.   Diabetes Type 2 controlled with complication -Moderate SSI  Diabetic foot ulcer/cellulitis diabetic foot -Per orthopedic surgery note 11/13 No surgical intervention at this time. Dr. Jodi GeraldsJohn Graves believes foreign body is chronic, and therefore will need no surgical intervention this hospitalization. Continue IV antibiotics. If at a later date may require removal. -11/12 CRP elevated at 5.4 -ABI pending -Echocardiogram showed no evidence of valvular stenosis. -Given patient's drug history and chronic cellulitis/diabetic ulcers will consult ID in a.m.   Chronic pain syndrome -See polysubstance abuse/altered mental status  OSA/OHS -BiPAP per respiratory  Chronic diastolic CHF -Clonidine 0.2 mg BID -Lasix 40 mg daily  Essential HTN    DVT prophylaxis: SCD Code Status: Full Family Communication: Son at bedside for discussion of plan of care Disposition Plan: TBD    Consultants:  Orthopedic surgery   Procedures/Significant Events:  11/13 Echocardiogram:Left  ventricle: The cavity size was normal. Systolic function was   normal. The estimated ejection fraction was in the range of 60%   to 65%. Wall motion was normal; there were no regional wall   motion abnormalities. Doppler parameters are consistent with   abnormal left ventricular relaxation (grade 1 diastolic   dysfunction). - Aortic valve: Transvalvular velocity was within the normal range.   There was no stenosis. There was no regurgitation. - Mitral valve: Transvalvular velocity was within the normal range.   There was no evidence for stenosis. There was no regurgitation. - Right ventricle: Systolic function was normal.   I have personally reviewed and interpreted all radiology studies and my findings are as above.  VENTILATOR SETTINGS:    Cultures 11/12 blood NGTD 11/13 MRSA by PCR positive  11/13 HIV negative 11/14 Acute Hepatitis panel pending     Antimicrobials: Anti-infectives (From admission, onward)   Start     Stop   01/11/17 1000  ceFEPIme (MAXIPIME) 2 g in dextrose 5 % 50 mL IVPB         01/11/17 0900  metroNIDAZOLE (FLAGYL) tablet 500 mg         01/11/17 0600  vancomycin (VANCOCIN) 1,250 mg in sodium chloride 0.9 % 250 mL IVPB         01/11/17 0600  piperacillin-tazobactam (ZOSYN) IVPB 3.375 g  Status:  Discontinued     01/11/17 0046   01/11/17 0100  piperacillin-tazobactam (ZOSYN) IVPB 3.375 g  Status:  Discontinued     01/11/17 0833   01/10/17 2215  piperacillin-tazobactam (ZOSYN) IVPB 3.375 g  Status:  Discontinued     01/11/17 0046   01/10/17 2130  vancomycin (VANCOCIN) 2,000 mg in sodium chloride 0.9 % 500 mL IVPB     01/11/17 0111       Devices    LINES / TUBES:      Continuous Infusions: . sodium chloride    . ceFEPime (MAXIPIME) IV 2 g (01/12/17 1034)  . vancomycin 1,250 mg (01/12/17 0515)     Objective: Vitals:   01/11/17 1521 01/11/17 2011 01/11/17 2040 01/12/17 0432  BP: 119/65 118/65  124/82  Pulse: 79 83  80  Resp: 18 18   18   Temp: 99.3 F (37.4 C) 98.6 F (37 C)  98 F (36.7 C)  TempSrc: Oral Oral  Axillary  SpO2: 95% 98% 98% 94%  Weight:      Height:        Intake/Output Summary (Last 24 hours) at 01/12/2017 1147 Last data filed at 01/12/2017 0307 Gross per 24 hour  Intake 590 ml  Output -  Net 590 ml   Filed Weights   01/10/17 1850 01/10/17 2328  Weight: (!) 360 lb (163.3 kg) (!) 348 lb 5.2 oz (158 kg)    Examination:  General: A/O 4 (significant confusion unable to follow conversation without redirection), No acute respiratory distress Neck:  Negative scars, masses, torticollis, lymphadenopathy, JVD Lungs: Clear to auscultation bilaterally without wheezes or crackles Cardiovascular: Tachycardic, Regular rhythm without murmur gallop or rub normal S1 and S2 Abdomen: MORBIDLY OBESE, negative abdominal pain, nondistended, positive soft, bowel sounds, no rebound, no ascites, no appreciable mass Extremities: No significant cyanosis, clubbing. LLE erythema, warm to touch,,  Skin: Multiple ulcers  on hands and feet, LLE see pictures below Psychiatric:  Negative depression, positive anxiety, negative fatigue, positive mania , positive belligerence Central nervous system:  Cranial nerves II through XII intact, tongue/uvula midline, all extremities muscle strength 5/5, sensation intact throughout,  negative dysarthria, negative expressive aphasia, negative receptive aphasia.     hands                   .     Data Reviewed: Care during the described time interval was provided by me .  I have reviewed this patient's available data, including medical history, events of note, physical examination, and all test results as part of my evaluation.  CBC: Recent Labs  Lab 01/10/17 1951 01/11/17 0420 01/12/17 0419  WBC 8.8 6.9 7.1  NEUTROABS 5.2  --   --   HGB 13.9 12.9* 13.7  HCT 41.0 38.6* 41.2  MCV 92.6 91.7 92.6  PLT 223 215 218   Basic Metabolic Panel: Recent Labs  Lab  01/10/17 1951 01/11/17 0420 01/12/17 0419  NA 135 139 143  K 3.8 3.8 4.1  CL 100* 105 110  CO2 27 28 27   GLUCOSE 127* 143* 131*  BUN 8 8 12   CREATININE 0.87 0.89 0.93  CALCIUM 8.7* 8.9 8.9  MG  --  2.3  --   PHOS  --  3.3  --    GFR: Estimated Creatinine Clearance: 163 mL/min (by C-G formula based on SCr of 0.93 mg/dL). Liver Function Tests: Recent Labs  Lab 01/10/17 1951 01/11/17 0420  AST 27 22  ALT 24 21  ALKPHOS 63 51  BILITOT 0.5 0.6  PROT 7.5 7.1  ALBUMIN 3.3* 3.1*   No results for input(s): LIPASE, AMYLASE in the last 168 hours. No results for input(s): AMMONIA in the last 168 hours. Coagulation Profile: No results for input(s): INR, PROTIME in the last 168 hours. Cardiac Enzymes: No results for input(s): CKTOTAL, CKMB, CKMBINDEX, TROPONINI in the last 168 hours. BNP (last 3 results) No results for input(s): PROBNP in the last 8760 hours. HbA1C: Recent Labs    01/11/17 0420  HGBA1C 5.7*   CBG: Recent Labs  Lab 01/11/17 0806 01/11/17 1201 01/11/17 1701 01/11/17 2029 01/12/17 0751  GLUCAP 130* 151* 130* 120* 122*   Lipid Profile: No results for input(s): CHOL, HDL, LDLCALC, TRIG, CHOLHDL, LDLDIRECT in the last 72 hours. Thyroid Function Tests: Recent Labs    01/11/17 0420  TSH 1.901   Anemia Panel: No results for input(s): VITAMINB12, FOLATE, FERRITIN, TIBC, IRON, RETICCTPCT in the last 72 hours. Sepsis Labs: No results for input(s): PROCALCITON, LATICACIDVEN in the last 168 hours.  Recent Results (from the past 240 hour(s))  Culture, blood (routine x 2)     Status: None (Preliminary result)   Collection Time: 01/10/17  7:51 PM  Result Value Ref Range Status   Specimen Description BLOOD LEFT ARM  Final   Special Requests   Final    BOTTLES DRAWN AEROBIC AND ANAEROBIC Blood Culture adequate volume   Culture   Final    NO GROWTH < 24 HOURS Performed at [facility], [address], [place], [place] [address]    Report Status  PENDING  Incomplete  MRSA PCR Screening     Status: Abnormal   Collection Time: 01/11/17  3:12 AM  Result Value Ref Range Status   MRSA by PCR POSITIVE (A) NEGATIVE Final    Comment:        The GeneXpert MRSA Assay (  FDA approved for NASAL specimens only), is one component of a comprehensive MRSA colonization surveillance program. It is not intended to diagnose MRSA infection nor to guide or monitor treatment for MRSA infections. RESULT CALLED TO, READ BACK BY AND VERIFIED WITHConrad Flandreau 1610 01/11/17 A [facility]          Radiology Studies: Dg Hand Complete Right  Result Date: 01/10/2017 CLINICAL DATA:  Right hand redness, infection EXAM: RIGHT HAND - COMPLETE 3+ VIEW COMPARISON:  06/07/2014 FINDINGS: Diffuse soft tissue swelling. No fracture or malalignment. No periostitis or bone destruction. 11 mm linear radiopaque foreign body within the soft tissues along the ulnar aspect of the wrist. IMPRESSION: 1. No acute osseous abnormality. 2. 11 mm linear radiopaque foreign body within the soft tissues along the ulnar aspect of the wrist. Electronically Signed   By: Jasmine Pang M.D.   On: 01/10/2017 22:03   Dg Foot Complete Left  Result Date: 01/10/2017 CLINICAL DATA:  41 year old male with history of diabetes and hypertension presenting with left foot pain and swelling. EXAM: LEFT FOOT - COMPLETE 3+ VIEW COMPARISON:  Left foot radiograph dated 01/07/2017 FINDINGS: There is no acute fracture or dislocation. No significant arthritic changes. There is diffuse soft tissue swelling of the foot primarily over the forefoot. A tiny radiopaque focus is seen in the superficial soft tissues of the plantar aspect of the hindfoot as seen on the prior radiograph. There is a focal area of soft tissue irregularity along the plantar aspect of the lateral foot seen on the oblique view which may represent a wound or ulcer. Clinical correlation is recommended. No soft tissue gas. IMPRESSION: 1. No acute  fracture or dislocation. 2. Diffuse soft tissue swelling of the foot primarily over the forefoot. Clinical correlation is recommended. 3. Small radiopaque focus in the superficial soft tissues of the plantar hindfoot similar to prior radiograph. 4. Focal skin irregularity along the lateral plantar foot may represent an ulcer. Clinical correlation is recommended. No soft tissue gas. Electronically Signed   By: Elgie Collard M.D.   On: 01/10/2017 19:53        Scheduled Meds: . alprazolam  2 mg Oral TID  . amphetamine-dextroamphetamine  60 mg Oral Q breakfast   And  . amphetamine-dextroamphetamine  30 mg Oral QPM  . atorvastatin  20 mg Oral QHS  . citalopram  20 mg Oral BH-q7a  . cloNIDine  0.2 mg Oral BID  . divalproex  500 mg Oral BID  . famotidine  10 mg Oral Daily  . feeding supplement (PRO-STAT SUGAR FREE 64)  30 mL Oral BID  . furosemide  40 mg Oral Daily  . gabapentin  800 mg Oral TID  . insulin aspart  0-15 Units Subcutaneous TID WC  . insulin aspart  0-5 Units Subcutaneous QHS  . metroNIDAZOLE  500 mg Oral Q8H  . mometasone-formoterol  2 puff Inhalation BID  . pantoprazole  40 mg Oral Daily  . protein supplement shake  11 oz Oral Q24H  . QUEtiapine  100 mg Oral q morning - 10a   And  . QUEtiapine  300 mg Oral QPM   And  . QUEtiapine  200 mg Oral QHS  . senna  1 tablet Oral BID  . sodium chloride flush  3 mL Intravenous Q12H  . traZODone  150 mg Oral QHS  . ursodiol  300 mg Oral BID   Continuous Infusions: . sodium chloride    . ceFEPime (MAXIPIME) IV 2 g (01/12/17 1034)  .  vancomycin 1,250 mg (01/12/17 0515)     LOS: 2 days    Time spent:40 min    WOODS, Roselind Messier, MD [facility] Pager [phone]  If 7PM-7AM, please contact night-coverage [email or web address] Password [facility] 01/12/2017, 11:47 AM

## 2017-01-13 ENCOUNTER — Other Ambulatory Visit: Payer: Self-pay

## 2017-01-13 DIAGNOSIS — F17210 Nicotine dependence, cigarettes, uncomplicated: Secondary | ICD-10-CM

## 2017-01-13 DIAGNOSIS — Z9989 Dependence on other enabling machines and devices: Secondary | ICD-10-CM

## 2017-01-13 DIAGNOSIS — Z810 Family history of intellectual disabilities: Secondary | ICD-10-CM

## 2017-01-13 DIAGNOSIS — Z818 Family history of other mental and behavioral disorders: Secondary | ICD-10-CM

## 2017-01-13 DIAGNOSIS — L03116 Cellulitis of left lower limb: Secondary | ICD-10-CM

## 2017-01-13 DIAGNOSIS — Z811 Family history of alcohol abuse and dependence: Secondary | ICD-10-CM

## 2017-01-13 DIAGNOSIS — F419 Anxiety disorder, unspecified: Secondary | ICD-10-CM

## 2017-01-13 LAB — CBC
HCT: 42.2 % (ref 39.0–52.0)
Hemoglobin: 14 g/dL (ref 13.0–17.0)
MCH: 30.8 pg (ref 26.0–34.0)
MCHC: 33.2 g/dL (ref 30.0–36.0)
MCV: 93 fL (ref 78.0–100.0)
PLATELETS: 226 10*3/uL (ref 150–400)
RBC: 4.54 MIL/uL (ref 4.22–5.81)
RDW: 13.5 % (ref 11.5–15.5)
WBC: 7.8 10*3/uL (ref 4.0–10.5)

## 2017-01-13 LAB — COMPREHENSIVE METABOLIC PANEL
ALK PHOS: 50 U/L (ref 38–126)
ALT: 19 U/L (ref 17–63)
ANION GAP: 7 (ref 5–15)
AST: 21 U/L (ref 15–41)
Albumin: 3.1 g/dL — ABNORMAL LOW (ref 3.5–5.0)
BILIRUBIN TOTAL: 0.6 mg/dL (ref 0.3–1.2)
BUN: 13 mg/dL (ref 6–20)
CALCIUM: 8.8 mg/dL — AB (ref 8.9–10.3)
CO2: 25 mmol/L (ref 22–32)
Chloride: 106 mmol/L (ref 101–111)
Creatinine, Ser: 0.82 mg/dL (ref 0.61–1.24)
GLUCOSE: 175 mg/dL — AB (ref 65–99)
POTASSIUM: 4 mmol/L (ref 3.5–5.1)
Sodium: 138 mmol/L (ref 135–145)
TOTAL PROTEIN: 7.2 g/dL (ref 6.5–8.1)

## 2017-01-13 LAB — GLUCOSE, CAPILLARY
GLUCOSE-CAPILLARY: 196 mg/dL — AB (ref 65–99)
GLUCOSE-CAPILLARY: 155 mg/dL — AB (ref 65–99)
Glucose-Capillary: 221 mg/dL — ABNORMAL HIGH (ref 65–99)
Glucose-Capillary: 149 mg/dL — ABNORMAL HIGH (ref 65–99)

## 2017-01-13 LAB — HEPATITIS PANEL, ACUTE
HEP A IGM: NEGATIVE
HEP B S AG: NEGATIVE
Hep B C IgM: NEGATIVE

## 2017-01-13 LAB — MAGNESIUM: MAGNESIUM: 2.1 mg/dL (ref 1.7–2.4)

## 2017-01-13 LAB — VANCOMYCIN, TROUGH: VANCOMYCIN TR: 17 ug/mL (ref 15–20)

## 2017-01-13 LAB — VANCOMYCIN, PEAK: VANCOMYCIN PK: 31 ug/mL (ref 30–40)

## 2017-01-13 MED ORDER — LORAZEPAM 1 MG PO TABS
1.0000 mg | ORAL_TABLET | Freq: Two times a day (BID) | ORAL | Status: DC | PRN
  Administered 2017-01-13 – 2017-01-14 (×3): 1 mg via ORAL
  Filled 2017-01-13 (×3): qty 1

## 2017-01-13 MED ORDER — QUETIAPINE FUMARATE 50 MG PO TABS
150.0000 mg | ORAL_TABLET | Freq: Every day | ORAL | Status: DC
  Administered 2017-01-13: 150 mg via ORAL
  Filled 2017-01-13: qty 1

## 2017-01-13 MED ORDER — QUETIAPINE FUMARATE 100 MG PO TABS
100.0000 mg | ORAL_TABLET | Freq: Every day | ORAL | Status: DC
  Administered 2017-01-14: 100 mg via ORAL
  Filled 2017-01-13 (×2): qty 1

## 2017-01-13 MED ORDER — SODIUM CHLORIDE 0.9 % IV SOLN
1500.0000 mg | Freq: Two times a day (BID) | INTRAVENOUS | Status: DC
  Administered 2017-01-13 – 2017-01-14 (×2): 1500 mg via INTRAVENOUS
  Filled 2017-01-13 (×3): qty 1500

## 2017-01-13 MED ORDER — QUETIAPINE FUMARATE 50 MG PO TABS
150.0000 mg | ORAL_TABLET | Freq: Every day | ORAL | Status: DC
  Administered 2017-01-13: 15:00:00 150 mg via ORAL
  Filled 2017-01-13 (×2): qty 1

## 2017-01-13 MED ORDER — DULOXETINE HCL 60 MG PO CPEP
90.0000 mg | ORAL_CAPSULE | Freq: Every day | ORAL | Status: DC
  Administered 2017-01-14: 90 mg via ORAL
  Filled 2017-01-13: qty 1

## 2017-01-13 MED ORDER — MUPIROCIN 2 % EX OINT
1.0000 "application " | TOPICAL_OINTMENT | Freq: Two times a day (BID) | CUTANEOUS | Status: DC
  Administered 2017-01-13 – 2017-01-14 (×4): 1 via NASAL
  Filled 2017-01-13: qty 22

## 2017-01-13 MED ORDER — CHLORHEXIDINE GLUCONATE CLOTH 2 % EX PADS
6.0000 | MEDICATED_PAD | Freq: Every day | CUTANEOUS | Status: DC
Start: 2017-01-13 — End: 2017-01-14
  Administered 2017-01-13: 6 via TOPICAL

## 2017-01-13 NOTE — Progress Notes (Signed)
Pt. asked to have bipap set up for use this evening, humidity filled, L/FFM placed on with pt. Remaining on room air, pt. To notify when/if ready to wear.

## 2017-01-13 NOTE — Progress Notes (Signed)
CSW consulted to assess pt's substance abuse concerns. Attempted to meet with pt yesterday- pt very drowsy and asked that CSW returned. This morning, pt requested CSW assistance with "providing a letter to court- I missed a court appearance yesterday." CSW explained that, per clerk of courts, on day of DC, a letter signed by attending MD can be sent to clerk of courts and this will be taken into account to excuse pt from missed court appearance. Pt states, "that doesn't help me any, there will be a warrant for me. I'll just check out of here."  CSW encouraged pt to remain for treatment until medically stable per medical team, and that needed documentation for court can be provided. Pt declined to discuss needs further.  Ilean SkillMeghan Malik Paar, MSW, LCSW Clinical Social Work 01/13/2017 [phone]

## 2017-01-13 NOTE — Progress Notes (Signed)
PT Cancellation Note  Patient Details Name: Bryan Gallegos MRN: [id number] DOB: 12/24/1975   Cancelled Treatment:    Reason Eval/Treat Not Completed: PT screened, no needs identified, will sign off. Spoke with pt who denied any further need for PT services. Will sign off. Thanks.    Rebeca AlertJannie Hanley Rispoli, MPT Pager: [phone]

## 2017-01-13 NOTE — Progress Notes (Addendum)
Pharmacy Antibiotic Note  Bryan Gallegos is a 41 y.o. male admitted on 01/10/2017 with cellulitis.  Pharmacy initially consulted for Vancomycin and Zosyn dosing. Zosyn changed to Cefepime and Metronidazole 11/13.   Today, 01/13/17:   Steady state peak = 31 mcg/mL, trough 17 mcg/mL   SCr stable  Afebrile  WBC WNL  Plan:  Adjust Vancomycin to 1500mg  IV q12h per levels drawn today, for estimated AUC 518.   Continue Cefepime 2g IV q12h, Metronidazole 500mg  PO q8h.   Daily SCr while on Vancomycin (for obesity).  Monitor cultures, clinical course, and for ability to de-escalate therapy.    Height: 6\' 2"  (188 cm) Weight: (!) 348 lb 5.2 oz (158 kg) IBW/kg (Calculated) : 82.2  Temp (24hrs), Avg:98.1 F (36.7 C), Min:97.9 F (36.6 C), Max:98.2 F (36.8 C)  Recent Labs  Lab 01/10/17 1951 01/11/17 0420 01/12/17 0419 01/13/17 0412 01/13/17 0833 01/13/17 1354  WBC 8.8 6.9 7.1 7.8  --   --   CREATININE 0.87 0.89 0.93 0.82  --   --   VANCOTROUGH  --   --   --   --   --  17  VANCOPEAK  --   --   --   --  31  --     Estimated Creatinine Clearance: 184.8 mL/min (by C-G formula based on SCr of 0.82 mg/dL).    Allergies  Allergen Reactions  . Ace Inhibitors Anaphylaxis  . Atenolol Anaphylaxis  . Lisinopril Anaphylaxis  . Prednisone Hives  . Ibuprofen Itching, Swelling and Other (See Comments)    Hand/feet swelling   . Tylenol [Acetaminophen] Other (See Comments)    Reaction:  GI bleeding     Antimicrobials this admission:  11/12 Vancomycin >> 11/12 Zosyn >> 11/13 11/13 Cefepime >> 11/13 Metronidazole >>   Microbiology results:  11/12 BCx: NGTD 11/12 MRSA PCR: positive  Thank you for allowing pharmacy to be a part of this patient's care.   Greer PickerelJigna Denise Washburn, PharmD, BCPS Pager: [phone] 01/13/2017 2:50 PM

## 2017-01-13 NOTE — Progress Notes (Signed)
PROGRESS NOTE    Bryan Gallegos  ZOX:[id number] DOB: 07/11/1975 DOA: 01/10/2017 PCP: System, Provider Not In     Brief Narrative:  Bryan Gallegos is a 41 y.o. male  PMHx TIA, IV Drug Abuse, Drug Overdose, HTN, Anxiety, Bipolar Disorder, DM Type 2,  Asthma; OSA/OHS,/ Morbidly obese   Presented with 5 day history of left foot pain after stepping on something initially he was found to have his shoes stuck to his foot when he presented to [facility] He was given prescriptions for Keflex and discharged from ER 3 days ago but continues to have pain patient was noted walking outside in the rain barefoot.  He reports continues to have fevers in wound on the left foot is draining cottage cheese reports last IV drug use on the eighth injects into his right arm. No chest pain shortness of breath associated this or nausea vomiting and diarrhea also reports wounds on his arms.   Reports he have had some ulcers popping up in different areas including face, mouth hands.      Subjective: 11/15 A/O 4, negative CP, negative SOB, negative N/V, negative abdominal pain.    Assessment & Plan:   Active Problems:   OSA on CPAP   Essential hypertension, benign   Diabetes mellitus type II, controlled (HCC)   Polysubstance dependence including opioid type drug, episodic abuse (HCC)   Chronic pain syndrome   Cellulitis in diabetic foot (HCC)   Diabetic foot ulcer (HCC)   Polysubstance abuse/IV Drug Abuse -IV drug abuser who shared needles with his wife. Per son Bryan JunesBrandon mother passed couple months ago secondary to what sounds like spinal abscess most likely secondary to IV drug use per son. -HIV negative -Acute hepatitis panel positive Hepatitis C antibody ,  Altered mental status/Somnolence -Per multiple physicians/RN notes patient somnolent to the point of not being able to answer questions or participate with examination. -ABG WNL -11 /15 DC Xanax----> Ativan QHS PRN -OxyIR 10 mg q 4 hr  PRN - Zanaflex 1 mg TID PRN  Bipolar, Anxiety -Per patient has not been taking prescribed psychiatric medication. Have consulted psychiatry for help with restarting appropriate medication and doses. Per psychiatry -Cymbalta 90 mg daily -Seroquel 100 mg qAm, 150 mg q noon, 150 mg qPm -DC Depakote -Trazodone 150 mg QHS   Diabetes Type 2 controlled with complication -Moderate SSI  Diabetic foot ulcer/cellulitis diabetic foot -Per orthopedic surgery note 11/13 No surgical intervention at this time. Dr. Jodi GeraldsJohn Graves believes foreign body is chronic, and therefore will need no surgical intervention this hospitalization. Continue IV antibiotics. If at a later date may require removal. -11/12 CRP elevated at 5.4 -ABI pending -Echocardiogram showed no evidence of valvular stenosis. -Given patient's drug history and chronic cellulitis/diabetic ulcers consult ID on 11/16: Recommendations for transitioning to by mouth medication? Length of treatment? -Wound care consulted dressing changes? Shoes?  Chronic pain syndrome -See polysubstance abuse/altered mental status  OSA/OHS -BiPAP per respiratory  Chronic diastolic CHF -Clonidine 0.2 mg BID -Lasix 40 mg daily  Essential HTN -See chronic diastolic CHF.   DVT prophylaxis: SCD Code Status: Full Family Communication: None Disposition Plan: TBD    Consultants:  Orthopedic surgery Psychiatry   Procedures/Significant Events:  11/13 Echocardiogram:Left ventricle: The cavity size was normal. Systolic function was   normal. The estimated ejection fraction was in the range of 60%   to 65%. Wall motion was normal; there were no regional wall   motion abnormalities. Doppler parameters are consistent with  abnormal left ventricular relaxation (grade 1 diastolic   dysfunction). - Aortic valve: Transvalvular velocity was within the normal range.   There was no stenosis. There was no regurgitation. - Mitral valve: Transvalvular velocity  was within the normal range.   There was no evidence for stenosis. There was no regurgitation. - Right ventricle: Systolic function was normal.   I have personally reviewed and interpreted all radiology studies and my findings are as above.  VENTILATOR SETTINGS:    Cultures 11/12 blood NGTD 11/13 MRSA by PCR positive  11/13 HIV negative 11/14 Acute Hepatitis panel positive Hepatitis C antibody 11/15 HCV RNA quant pending     Antimicrobials: Anti-infectives (From admission, onward)   Start     Stop   01/11/17 1000  ceFEPIme (MAXIPIME) 2 g in dextrose 5 % 50 mL IVPB         01/11/17 0900  metroNIDAZOLE (FLAGYL) tablet 500 mg         01/11/17 0600  vancomycin (VANCOCIN) 1,250 mg in sodium chloride 0.9 % 250 mL IVPB         01/11/17 0600  piperacillin-tazobactam (ZOSYN) IVPB 3.375 g  Status:  Discontinued     01/11/17 0046   01/11/17 0100  piperacillin-tazobactam (ZOSYN) IVPB 3.375 g  Status:  Discontinued     01/11/17 0833   01/10/17 2215  piperacillin-tazobactam (ZOSYN) IVPB 3.375 g  Status:  Discontinued     01/11/17 0046   01/10/17 2130  vancomycin (VANCOCIN) 2,000 mg in sodium chloride 0.9 % 500 mL IVPB     01/11/17 0111       Devices    LINES / TUBES:      Continuous Infusions: . sodium chloride    . ceFEPime (MAXIPIME) IV Stopped (01/12/17 2230)  . vancomycin 1,250 mg (01/13/17 0518)     Objective: Vitals:   01/12/17 1522 01/12/17 2013 01/13/17 0525 01/13/17 0754  BP: 122/80 119/76 123/69   Pulse: 80 86 76   Resp: 18 18 18    Temp: 98.2 F (36.8 C) 98.2 F (36.8 C) 97.9 F (36.6 C)   TempSrc: Oral Oral Oral   SpO2: 95% 98% 99% 97%  Weight:      Height:       No intake or output data in the 24 hours ending 01/13/17 0826 Filed Weights   01/10/17 1850 01/10/17 2328  Weight: (!) 360 lb (163.3 kg) (!) 348 lb 5.2 oz (158 kg)    Physical Exam:  General: A/O 4  No acute respiratory distress Neck:  Negative scars, masses, torticollis,  lymphadenopathy, JVD Lungs: Clear to auscultation bilaterally without wheezes or crackles Cardiovascular:  Regular rhythm and rate without murmur gallop or rub normal S1 and S2 Abdomen: MORBIDLY OBESE, negative abdominal pain, nondistended, positive soft, bowel sounds, no rebound, no ascites, no appreciable mass Extremities: No significant cyanosis, clubbing. LLE erythema, warm to touch,,  Skin: Multiple ulcers on hands and feet, LLE see pictures below Psychiatric:  Negative depression, positive anxiety, negative fatigue, positive mania , positive belligerence Central nervous system:  Cranial nerves II through XII intact, tongue/uvula midline, all extremities muscle strength 5/5, sensation intact throughout,  negative dysarthria, negative expressive aphasia, negative receptive aphasia.     hands                   .     Data Reviewed: Care during the described time interval was provided by me .  I have reviewed this patient's available data, including medical history,  events of note, physical examination, and all test results as part of my evaluation.  CBC: Recent Labs  Lab 01/10/17 1951 01/11/17 0420 01/12/17 0419 01/13/17 0412  WBC 8.8 6.9 7.1 7.8  NEUTROABS 5.2  --   --   --   HGB 13.9 12.9* 13.7 14.0  HCT 41.0 38.6* 41.2 42.2  MCV 92.6 91.7 92.6 93.0  PLT 223 215 218 226   Basic Metabolic Panel: Recent Labs  Lab 01/10/17 1951 01/11/17 0420 01/12/17 0419 01/13/17 0412  NA 135 139 143 138  K 3.8 3.8 4.1 4.0  CL 100* 105 110 106  CO2 27 28 27 25   GLUCOSE 127* 143* 131* 175*  BUN 8 8 12 13   CREATININE 0.87 0.89 0.93 0.82  CALCIUM 8.7* 8.9 8.9 8.8*  MG  --  2.3  --  2.1  PHOS  --  3.3  --   --    GFR: Estimated Creatinine Clearance: 184.8 mL/min (by C-G formula based on SCr of 0.82 mg/dL). Liver Function Tests: Recent Labs  Lab 01/10/17 1951 01/11/17 0420 01/13/17 0412  AST 27 22 21   ALT 24 21 19   ALKPHOS 63 51 50  BILITOT 0.5 0.6 0.6  PROT 7.5  7.1 7.2  ALBUMIN 3.3* 3.1* 3.1*   No results for input(s): LIPASE, AMYLASE in the last 168 hours. No results for input(s): AMMONIA in the last 168 hours. Coagulation Profile: No results for input(s): INR, PROTIME in the last 168 hours. Cardiac Enzymes: No results for input(s): CKTOTAL, CKMB, CKMBINDEX, TROPONINI in the last 168 hours. BNP (last 3 results) No results for input(s): PROBNP in the last 8760 hours. HbA1C: Recent Labs    01/11/17 0420  HGBA1C 5.7*   CBG: Recent Labs  Lab 01/12/17 0751 01/12/17 1201 01/12/17 1639 01/12/17 2123 01/13/17 0758  GLUCAP 122* 216* 119* 186* 196*   Lipid Profile: No results for input(s): CHOL, HDL, LDLCALC, TRIG, CHOLHDL, LDLDIRECT in the last 72 hours. Thyroid Function Tests: Recent Labs    01/11/17 0420  TSH 1.901   Anemia Panel: No results for input(s): VITAMINB12, FOLATE, FERRITIN, TIBC, IRON, RETICCTPCT in the last 72 hours. Sepsis Labs: No results for input(s): PROCALCITON, LATICACIDVEN in the last 168 hours.  Recent Results (from the past 240 hour(s))  Culture, blood (routine x 2)     Status: None (Preliminary result)   Collection Time: 01/10/17  7:51 PM  Result Value Ref Range Status   Specimen Description BLOOD LEFT ARM  Final   Special Requests   Final    BOTTLES DRAWN AEROBIC AND ANAEROBIC Blood Culture adequate volume   Culture   Final    NO GROWTH 2 DAYS Performed at [facility], [address] [place], [place] [address]    Report Status PENDING  Incomplete  Culture, blood (routine x 2)     Status: None (Preliminary result)   Collection Time: 01/10/17 11:25 PM  Result Value Ref Range Status   Specimen Description BLOOD RIGHT HAND  Final   Special Requests IN PEDIATRIC BOTTLE Blood Culture adequate volume  Final   Culture   Final    NO GROWTH 1 DAY Performed at [facility], [address], [place], [place] [address]    Report Status PENDING  Incomplete  MRSA PCR Screening     Status: Abnormal    Collection Time: 01/11/17  3:12 AM  Result Value Ref Range Status   MRSA by PCR POSITIVE (A) NEGATIVE Final  Comment:        The GeneXpert MRSA Assay (FDA approved for NASAL specimens only), is one component of a comprehensive MRSA colonization surveillance program. It is not intended to diagnose MRSA infection nor to guide or monitor treatment for MRSA infections. RESULT CALLED TO, READ BACK BY AND VERIFIED WITHConrad Gallegos 1610 01/11/17 A [facility]          Radiology Studies: No results found.      Scheduled Meds: . amphetamine-dextroamphetamine  60 mg Oral Q breakfast   And  . amphetamine-dextroamphetamine  30 mg Oral QPM  . atorvastatin  20 mg Oral QHS  . Chlorhexidine Gluconate Cloth  6 each Topical Q0600  . citalopram  20 mg Oral BH-q7a  . cloNIDine  0.2 mg Oral BID  . divalproex  500 mg Oral BID  . famotidine  10 mg Oral Daily  . feeding supplement (PRO-STAT SUGAR FREE 64)  30 mL Oral BID  . furosemide  40 mg Oral Daily  . gabapentin  800 mg Oral TID  . insulin aspart  0-15 Units Subcutaneous TID WC  . insulin aspart  0-5 Units Subcutaneous QHS  . metroNIDAZOLE  500 mg Oral Q8H  . mometasone-formoterol  2 puff Inhalation BID  . mupirocin ointment  1 application Nasal BID  . pantoprazole  40 mg Oral Daily  . protein supplement shake  11 oz Oral Q24H  . QUEtiapine  100 mg Oral q morning - 10a   And  . QUEtiapine  300 mg Oral QPM   And  . QUEtiapine  200 mg Oral QHS  . senna  1 tablet Oral BID  . sodium chloride flush  3 mL Intravenous Q12H  . traZODone  150 mg Oral QHS  . ursodiol  300 mg Oral BID   Continuous Infusions: . sodium chloride    . ceFEPime (MAXIPIME) IV Stopped (01/12/17 2230)  . vancomycin 1,250 mg (01/13/17 0518)     LOS: 3 days    Time spent:40 min    Madicyn Mesina, Roselind Messier, MD [facility] Pager [phone]  If 7PM-7AM, please contact night-coverage [email or web address] Password [email or web address] 01/13/2017, 8:26 AM

## 2017-01-13 NOTE — Consult Note (Addendum)
[place] [facility] Consult   Reason for Consult:  Medication management Referring Physician:  Dr. Sherral Hammers Patient Identification: Bryan Gallegos MRN:  [id number] Principal Diagnosis: MDD (major depressive disorder), recurrent episode, moderate ([place]) Diagnosis:   Patient Active Problem List   Diagnosis Date Noted  . Cellulitis in diabetic foot ([place]) [V42.595, G38.756] 01/10/2017  . Diabetic foot ulcer ([place]) [E33.295, L97.509] 01/10/2017  . Hypotension [I95.9] 11/11/2016  . Suicide attempt ([place]) [T14.91XA] 10/17/2016  . Acute and chronic respiratory failure with hypercapnia (HCC) [J96.22]   . Pulmonary edema [J81.1]   . Syncope [R55]   . Respiratory failure ([place]) [J96.90] 01/04/2016  . Drug overdose [T50.901A]   . Acute encephalopathy [G93.40] 01/01/2016  . Hyponatremia [E87.1] 01/01/2016  . Chronic pain syndrome [G89.4] 10/22/2015  . Healthcare maintenance [Z00.00] 10/22/2015  . Hoarse voice quality [R49.0] 10/11/2015  . Major depressive disorder, recurrent severe without psychotic features ([place]) [F33.2] 09/04/2015  . Polysubstance dependence including opioid type drug, episodic abuse ([place]) [F11.20, F19.20] 09/04/2015  . RUQ abdominal pain [R10.11]   . Cholelithiasis [K80.20] 05/17/2015  . Antisocial personality disorder ([place]) [F60.2] 04/11/2015  . Opioid use disorder, severe, dependence ([place]) [F11.20] 04/11/2015  . Benzodiazepine dependence ([place]) [F13.20] 04/11/2015  . Tobacco use disorder [F17.200] 04/10/2015  . Narcotic dependency, continuous ([place]) [F11.20] 03/14/2014  . Chronic back pain greater than 3 months duration [M54.9, G89.29] 01/28/2014  . Gastroesophageal reflux disease with esophagitis [K21.0] 01/28/2014  . Essential hypertension, benign [I10] 01/28/2014  . Diabetes mellitus type II, controlled ([place]) [E11.9] 01/28/2014  . Morbid obesity ([place]) [E66.01] 01/28/2014  . TIA (transient ischemic attack) [G45.9] 09/02/2013  . HLD (hyperlipidemia) [E78.5]  08/06/2013  . OSA on CPAP [G47.33, Z99.89] 08/06/2013    Total Time spent with patient: 1 hour  Subjective:   Bryan Gallegos is a 41 y.o. male patient admitted with diabetic foot ulcer and cellulitis.  HPI:   Per chart review, patient has a history of bipolar disorder and anxiety. He has not been taking his prescribed psychiatric medication. 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 He was somnolent during his hospitalization and this was thought to be secondary to being overmedicated with benzodiazepines and pain medications. Xanax (6 mg daily to 1 mg daily PRN), Zanaflex (12 mg daily PRN to 1 mg daily PRN) and Oxycodone (120 mg daily PRN to 40 mg daily PRN) have been reduced. He is receiving Depakote 500 mg BID, Gabapentin 800 mg TID, Seroquel 100/300/200 mg daily and Trazodone 150 mg qhs.  [name] was last admitted to [facility] in July 2017 for SI and detox from opiates. PMP indicates patient is prescribed Xanax, Oxycodone and Adderall by Bernadene Person, NP.   On interview, [name] reports that he takes psychiatric medications for depression and irritability. He reports that they have not been helpful but he later admits that he is not compliant with his medications. He reports that he was diagnosed with bipolar disorder at 41 y/o. He denies a history of manic symptoms. He did not sleep for a few days after his girlfriend died 4 months ago. He denies euphoria or increased energy during this time. He reports that his mood has not been great since his girlfriend passed away. He feels like killing himself when he is doing bad and other times he does not care if he is living. He denies current SI. He reports poor sleep (diagnosed with OSA and will not use BiPAP), poor  appetite and anxiety. He still enjoys hunting and fishing. He denies HI or AVH.  Past Psychiatric History: MDD, opioid abuse and history of suicide attempts.   Risk  to Self: Is patient at risk for suicide?: No Risk to Others:  None. Denies HI.  Prior Inpatient Therapy:  He has been hospitalized several times. He was admitted 4 weeks ago for SI with overdose. He believes he took his blood pressure medication.  Prior Outpatient Therapy:  None currently.   Past Medical History:  Past Medical History:  Diagnosis Date  . Adult ADHD   . Anaphylactic reaction   . Anxiety   . Asthma   . Bipolar disorder ([place])   . Complication of anesthesia    Per patient difficult intubation;  . Diabetes mellitus   . Difficult intubation    Per patient  . Drug overdose   . Heart valve disorder    s/p echocardiogram  . Hyperlipidemia   . Hypertension   . Morbidly obese ([place])   . OSA (obstructive sleep apnea)   . Polysubstance abuse ([place])   . Transient cerebral ischemia    Unknown    Past Surgical History:  Procedure Laterality Date  . CARPAL TUNNEL RELEASE    . ESOPHAGOGASTRODUODENOSCOPY N/A 04/05/2015   Procedure: ESOPHAGOGASTRODUODENOSCOPY (EGD);  Surgeon: Rogene Houston, MD;  Location: [facility];  Service: Endoscopy;  Laterality: N/A;  . ESOPHAGOGASTRODUODENOSCOPY (EGD) WITH PROPOFOL N/A 05/21/2015   Procedure: ESOPHAGOGASTRODUODENOSCOPY (EGD) WITH PROPOFOL;  Surgeon: Ladene Artist, MD;  Location: [facility] ENDOSCOPY;  Service: Endoscopy;  Laterality: N/A;  . NOSE SURGERY    . TOOTH EXTRACTION    . WISDOM TOOTH EXTRACTION     Family History:  Family History  Problem Relation Age of Onset  . CAD Mother        Living  . Diabetes Mellitus II Mother   . Stroke Mother   . Hypertension Mother   . Congestive Heart Failure Mother   . Kidney disease Mother   . Fibromyalgia Mother   . Thyroid disease Mother   . Hyperlipidemia Mother   . Liver disease Mother   . Alcoholism Father 89       Deceased  . Arthritis Maternal Grandmother   . Congestive Heart Failure Maternal Grandmother   . Hypertension Maternal Grandmother   . Lung cancer Maternal Grandfather    . Colon cancer Maternal Aunt   . Stomach cancer Maternal Aunt   . Heart disease Other        Paternal & Maternal  . Crohn's disease Other   . Hypertension Other        Paternal & Maternal  . Hypertension Brother        x3  . Hypertension Sister        #1  . Bipolar disorder Sister        #1  . ADD / ADHD Son        x3  . Bipolar disorder Son        x3  . Asperger's syndrome Son    Family Psychiatric  History: Sister-bipolar disorder, son-ADHD and son-asperger's syndrome.  Social History:  Social History   Substance and Sexual Activity  Alcohol Use No  . Alcohol/week: 0.0 oz     Social History   Substance and Sexual Activity  Drug Use No    Social History   Socioeconomic History  . Marital status: Single    Spouse name: None  . Number of children: None  .  Years of education: None  . Highest education level: None  Social Needs  . Financial resource strain: None  . Food insecurity - worry: None  . Food insecurity - inability: None  . Transportation needs - medical: None  . Transportation needs - non-medical: None  Occupational History  . None  Tobacco Use  . Smoking status: Current Every Day Smoker    Packs/day: 0.50    Years: 23.00    Pack years: 11.50    Types: Cigarettes  . Smokeless tobacco: Never Used  Substance and Sexual Activity  . Alcohol use: No    Alcohol/week: 0.0 oz  . Drug use: No  . Sexual activity: Not Currently    Partners: Female    Birth control/protection: None  Other Topics Concern  . None  Social History Narrative  . None   Additional Social History: He lives at home alone. He previously lived with his girlfriend of 23 years but she passed away 4 months ago. He has 3 grown children (22, 37 and 55 y/o). He is on SSD. He has a legal history. He was recently charged with driving on a restricted license. He may have to serve time in jail. He reports intermittent cocaine use. He denies alcohol or other illicit substance use. He  smokes 0.5 ppd x 23 years.     Allergies:   Allergies  Allergen Reactions  . Ace Inhibitors Anaphylaxis  . Atenolol Anaphylaxis  . Lisinopril Anaphylaxis  . Prednisone Hives  . Ibuprofen Itching, Swelling and Other (See Comments)    Hand/feet swelling   . Tylenol [Acetaminophen] Other (See Comments)    Reaction:  GI bleeding     Labs:  Results for orders placed or performed during the hospital encounter of 01/10/17 (from the past 48 hour(s))  Glucose, capillary     Status: Abnormal   Collection Time: 01/11/17 12:01 PM  Result Value Ref Range   Glucose-Capillary 151 (H) 65 - 99 mg/dL   Comment 1 Notify RN    Comment 2 Document in Chart   Urine rapid drug screen (hosp performed)     Status: Abnormal   Collection Time: 01/11/17  3:12 PM  Result Value Ref Range   Opiates NONE DETECTED NONE DETECTED   Cocaine NONE DETECTED NONE DETECTED   Benzodiazepines POSITIVE (A) NONE DETECTED   Amphetamines NONE DETECTED NONE DETECTED   Tetrahydrocannabinol NONE DETECTED NONE DETECTED   Barbiturates NONE DETECTED NONE DETECTED    Comment:        DRUG SCREEN FOR MEDICAL PURPOSES ONLY.  IF CONFIRMATION IS NEEDED FOR ANY PURPOSE, NOTIFY LAB WITHIN 5 DAYS.        LOWEST DETECTABLE LIMITS FOR URINE DRUG SCREEN Drug Class       Cutoff (ng/mL) Amphetamine      1000 Barbiturate      200 Benzodiazepine   166 Tricyclics       060 Opiates          300 Cocaine          300 THC              50   Glucose, capillary     Status: Abnormal   Collection Time: 01/11/17  5:01 PM  Result Value Ref Range   Glucose-Capillary 130 (H) 65 - 99 mg/dL  Glucose, capillary     Status: Abnormal   Collection Time: 01/11/17  8:29 PM  Result Value Ref Range   Glucose-Capillary 120 (H) 65 - 99 mg/dL  CBC     Status: None   Collection Time: 01/12/17  4:19 AM  Result Value Ref Range   WBC 7.1 4.0 - 10.5 K/uL   RBC 4.45 4.22 - 5.81 MIL/uL   Hemoglobin 13.7 13.0 - 17.0 g/dL   HCT 41.2 39.0 - 52.0 %   MCV  92.6 78.0 - 100.0 fL   MCH 30.8 26.0 - 34.0 pg   MCHC 33.3 30.0 - 36.0 g/dL   RDW 13.4 11.5 - 15.5 %   Platelets 218 150 - 400 K/uL  Basic metabolic panel     Status: Abnormal   Collection Time: 01/12/17  4:19 AM  Result Value Ref Range   Sodium 143 135 - 145 mmol/L   Potassium 4.1 3.5 - 5.1 mmol/L   Chloride 110 101 - 111 mmol/L   CO2 27 22 - 32 mmol/L   Glucose, Bld 131 (H) 65 - 99 mg/dL   BUN 12 6 - 20 mg/dL   Creatinine, Ser 0.93 0.61 - 1.24 mg/dL   Calcium 8.9 8.9 - 10.3 mg/dL   GFR calc non Af Amer >60 >60 mL/min   GFR calc Af Amer >60 >60 mL/min    Comment: (NOTE) The eGFR has been calculated using the CKD EPI equation. This calculation has not been validated in all clinical situations. eGFR's persistently <60 mL/min signify possible Chronic Kidney Disease.    Anion gap 6 5 - 15  Glucose, capillary     Status: Abnormal   Collection Time: 01/12/17  7:51 AM  Result Value Ref Range   Glucose-Capillary 122 (H) 65 - 99 mg/dL  Glucose, capillary     Status: Abnormal   Collection Time: 01/12/17 12:01 PM  Result Value Ref Range   Glucose-Capillary 216 (H) 65 - 99 mg/dL  Blood gas, arterial     Status: Abnormal   Collection Time: 01/12/17  1:18 PM  Result Value Ref Range   FIO2 21.00    Delivery systems ROOM AIR    pH, Arterial 7.436 7.350 - 7.450   pCO2 arterial 36.5 32.0 - 48.0 mmHg   pO2, Arterial 74.4 (L) 83.0 - 108.0 mmHg   Bicarbonate 24.1 20.0 - 28.0 mmol/L   Acid-Base Excess 0.7 0.0 - 2.0 mmol/L   O2 Saturation 94.5 %   Patient temperature 98.6    Collection site RIGHT RADIAL    Drawn by [id number]    Sample type ARTERIAL DRAW    Allens test (pass/fail) PASS PASS  Hepatitis panel, acute     Status: Abnormal   Collection Time: 01/12/17  1:25 PM  Result Value Ref Range   Hepatitis B Surface Ag Negative Negative   HCV Ab >11.0 (H) 0.0 - 0.9 s/co ratio    Comment: (NOTE)                                  Negative:     < 0.8                              Indeterminate: 0.8 - 0.9                                  Positive:     > 0.9 The CDC recommends that a positive HCV antibody result be followed up with a HCV Nucleic  Acid Amplification test (433295). Performed At: [facility] [place], [place] [id number] Rush Farmer MD [id number]    Hep A IgM Negative Negative   Hep B C IgM Negative Negative  Glucose, capillary     Status: Abnormal   Collection Time: 01/12/17  4:39 PM  Result Value Ref Range   Glucose-Capillary 119 (H) 65 - 99 mg/dL  Glucose, capillary     Status: Abnormal   Collection Time: 01/12/17  9:23 PM  Result Value Ref Range   Glucose-Capillary 186 (H) 65 - 99 mg/dL  Comprehensive metabolic panel     Status: Abnormal   Collection Time: 01/13/17  4:12 AM  Result Value Ref Range   Sodium 138 135 - 145 mmol/L   Potassium 4.0 3.5 - 5.1 mmol/L   Chloride 106 101 - 111 mmol/L   CO2 25 22 - 32 mmol/L   Glucose, Bld 175 (H) 65 - 99 mg/dL   BUN 13 6 - 20 mg/dL   Creatinine, Ser 0.82 0.61 - 1.24 mg/dL   Calcium 8.8 (L) 8.9 - 10.3 mg/dL   Total Protein 7.2 6.5 - 8.1 g/dL   Albumin 3.1 (L) 3.5 - 5.0 g/dL   AST 21 15 - 41 U/L   ALT 19 17 - 63 U/L   Alkaline Phosphatase 50 38 - 126 U/L   Total Bilirubin 0.6 0.3 - 1.2 mg/dL   GFR calc non Af Amer >60 >60 mL/min   GFR calc Af Amer >60 >60 mL/min    Comment: (NOTE) The eGFR has been calculated using the CKD EPI equation. This calculation has not been validated in all clinical situations. eGFR's persistently <60 mL/min signify possible Chronic Kidney Disease.    Anion gap 7 5 - 15  Magnesium     Status: None   Collection Time: 01/13/17  4:12 AM  Result Value Ref Range   Magnesium 2.1 1.7 - 2.4 mg/dL  CBC     Status: None   Collection Time: 01/13/17  4:12 AM  Result Value Ref Range   WBC 7.8 4.0 - 10.5 K/uL   RBC 4.54 4.22 - 5.81 MIL/uL   Hemoglobin 14.0 13.0 - 17.0 g/dL   HCT 42.2 39.0 - 52.0 %   MCV 93.0 78.0 - 100.0 fL   MCH 30.8 26.0 -  34.0 pg   MCHC 33.2 30.0 - 36.0 g/dL   RDW 13.5 11.5 - 15.5 %   Platelets 226 150 - 400 K/uL  Glucose, capillary     Status: Abnormal   Collection Time: 01/13/17  7:58 AM  Result Value Ref Range   Glucose-Capillary 196 (H) 65 - 99 mg/dL   Comment 1 Notify RN    Comment 2 Document in Chart   Vancomycin, peak     Status: None   Collection Time: 01/13/17  8:33 AM  Result Value Ref Range   Vancomycin Pk 31 30 - 40 ug/mL    Current Facility-Administered Medications  Medication Dose Route Frequency Provider Last Rate Last Dose  . 0.9 %  sodium chloride infusion  250 mL Intravenous PRN Doutova, Anastassia, MD      . albuterol (PROVENTIL) (2.5 MG/3ML) 0.083% nebulizer solution 2.5 mg  2.5 mg Nebulization Q2H PRN Doutova, Anastassia, MD      . ALPRAZolam Duanne Moron) tablet 1 mg  1 mg Oral QHS PRN Allie Bossier, MD   1 mg at 01/12/17 2214  . amphetamine-dextroamphetamine (ADDERALL) tablet 60 mg  60 mg Oral Q breakfast Doutova,  Anastassia, MD   60 mg at 01/13/17 0813   And  . amphetamine-dextroamphetamine (ADDERALL) tablet 30 mg  30 mg Oral QPM Doutova, Anastassia, MD   30 mg at 01/12/17 1716  . atorvastatin (LIPITOR) tablet 20 mg  20 mg Oral QHS Toy Baker, MD   20 mg at 01/12/17 2201  . bisacodyl (DULCOLAX) suppository 10 mg  10 mg Rectal Daily PRN Doutova, Anastassia, MD      . ceFEPIme (MAXIPIME) 2 g in dextrose 5 % 50 mL IVPB  2 g Intravenous Q12H Berton Mount, RPH   Stopped at 01/12/17 2230  . Chlorhexidine Gluconate Cloth 2 % PADS 6 each  6 each Topical Q0600 Allie Bossier, MD   6 each at 01/13/17 [phone]  . citalopram (CELEXA) tablet 20 mg  20 mg Oral Regan Rakers, Nyoka Lint, MD   20 mg at 01/13/17 1027  . cloNIDine (CATAPRES) tablet 0.2 mg  0.2 mg Oral BID Toy Baker, MD   0.2 mg at 01/12/17 2201  . divalproex (DEPAKOTE) DR tablet 500 mg  500 mg Oral BID Toy Baker, MD   500 mg at 01/12/17 2202  . famotidine (PEPCID) tablet 10 mg  10 mg Oral Daily Doutova,  Anastassia, MD   10 mg at 01/12/17 1034  . feeding supplement (PRO-STAT SUGAR FREE 64) liquid 30 mL  30 mL Oral BID Elodia Florence., MD   30 mL at 01/13/17 0810  . furosemide (LASIX) tablet 40 mg  40 mg Oral Daily Doutova, Anastassia, MD   40 mg at 01/12/17 1031  . gabapentin (NEURONTIN) capsule 800 mg  800 mg Oral TID Toy Baker, MD   800 mg at 01/12/17 2201  . insulin aspart (novoLOG) injection 0-15 Units  0-15 Units Subcutaneous TID WC Toy Baker, MD   3 Units at 01/13/17 0809  . insulin aspart (novoLOG) injection 0-5 Units  0-5 Units Subcutaneous QHS Doutova, Anastassia, MD      . metroNIDAZOLE (FLAGYL) tablet 500 mg  500 mg Oral Q8H Elodia Florence., MD   500 mg at 01/13/17 0518  . mometasone-formoterol (DULERA) 200-5 MCG/ACT inhaler 2 puff  2 puff Inhalation BID Toy Baker, MD   2 puff at 01/13/17 0751  . mupirocin ointment (BACTROBAN) 2 % 1 application  1 application Nasal BID Allie Bossier, MD   1 application at 25/36/64 [phone]  . ondansetron (ZOFRAN) tablet 4 mg  4 mg Oral Q6H PRN Toy Baker, MD       Or  . ondansetron (ZOFRAN) injection 4 mg  4 mg Intravenous Q6H PRN Doutova, Anastassia, MD      . oxyCODONE (Oxy IR/ROXICODONE) immediate release tablet 10 mg  10 mg Oral Q4H PRN Allie Bossier, MD   10 mg at 01/13/17 0518  . pantoprazole (PROTONIX) EC tablet 40 mg  40 mg Oral Daily Doutova, Anastassia, MD   40 mg at 01/12/17 1031  . polyethylene glycol (MIRALAX / GLYCOLAX) packet 17 g  17 g Oral Daily PRN Doutova, Anastassia, MD      . protein supplement (PREMIER PROTEIN) liquid  11 oz Oral Q24H Elodia Florence., MD   11 oz at 01/12/17 1400  . QUEtiapine (SEROQUEL) tablet 100 mg  100 mg Oral q morning - 10a Doutova, Anastassia, MD   100 mg at 01/11/17 1029   And  . QUEtiapine (SEROQUEL) tablet 300 mg  300 mg Oral QPM Doutova, Anastassia, MD   300 mg at 01/12/17 1717  And  . QUEtiapine (SEROQUEL) tablet 200 mg  200 mg Oral QHS  Doutova, Anastassia, MD   200 mg at 01/12/17 2202  . senna (SENOKOT) tablet 8.6 mg  1 tablet Oral BID Toy Baker, MD   8.6 mg at 01/12/17 2202  . sodium chloride flush (NS) 0.9 % injection 3 mL  3 mL Intravenous Q12H Doutova, Anastassia, MD      . sodium chloride flush (NS) 0.9 % injection 3 mL  3 mL Intravenous PRN Doutova, Anastassia, MD      . tiZANidine (ZANAFLEX) tablet 1 mg  1 mg Oral TID PRN Allie Bossier, MD   1 mg at 01/12/17 2041  . traZODone (DESYREL) tablet 150 mg  150 mg Oral QHS Toy Baker, MD   150 mg at 01/12/17 2202  . ursodiol (ACTIGALL) capsule 300 mg  300 mg Oral BID Toy Baker, MD   300 mg at 01/12/17 2214  . vancomycin (VANCOCIN) 1,250 mg in sodium chloride 0.9 % 250 mL IVPB  1,250 mg Intravenous Q8H Eudelia Bunch, RPH 166.7 mL/hr at 01/13/17 0518 1,250 mg at 01/13/17 0518    Musculoskeletal: Strength & Muscle Tone: within normal limits Gait & Station: normal Patient leans: N/A  Psychiatric Specialty Exam: Physical Exam  Nursing note and vitals reviewed. Constitutional: He is oriented to person, place, and time. He appears well-developed and well-nourished.  HENT:  Head: Normocephalic and atraumatic.  Neck: Normal range of motion.  Respiratory: Effort normal.  Musculoskeletal: Normal range of motion.  Neurological: He is alert and oriented to person, place, and time.  Skin: No rash noted.  Psychiatric: He has a normal mood and affect. His behavior is normal. Judgment and thought content normal.    Review of Systems  Psychiatric/Behavioral: Positive for depression and substance abuse. Negative for hallucinations and suicidal ideas. The patient is nervous/anxious and has insomnia.     Blood pressure 123/69, pulse 76, temperature 97.9 F (36.6 C), temperature source Oral, resp. rate 18, height 6' 2" (1.88 m), weight (!) 158 kg (348 lb 5.2 oz), SpO2 97 %.Body mass index is 44.72 kg/m.  General Appearance: Well Groomed, Caucasian male  who is overweight with a beard and a hospital gown and lying in bed. NAD.   Eye Contact:  Good  Speech:  Clear and Coherent and Normal Rate  Volume:  Normal  Mood:  Depressed  Affect:  Full Range  Thought Process:  Goal Directed and Linear  Orientation:  Full (Time, Place, and Person)  Thought Content:  Logical  Suicidal Thoughts:  No  Homicidal Thoughts:  No  Memory:  Immediate;   Good Recent;   Good Remote;   Good  Judgement:  Fair  Insight:  Fair  Psychomotor Activity:  Normal  Concentration:  Concentration: Good and Attention Span: Good  Recall:  Good  Fund of Knowledge:  Good  Language:  Good  Akathisia:  No  Handed:  Right  AIMS (if indicated):   N/A  Assets:  Communication Skills Desire for Improvement Housing Social Support  ADL's:  Intact  Cognition:  WNL  Sleep:   Poor   Assessment: Bryan Gallegos is a 41 y.o. male who was admitted with diabetic foot ulcer and cellulitis. Psychiatry was consulted for medication management of depression and anxiety. He reports depressed mood with poor sleep, poor appetite and anxiety since his wife passed away 4 months ago. He does not have a history consistent with bipolar disorder. He has not been compliant with  his medications due to not being able to afford them. He agrees to simplifying his medication regimen and resources for affordable medications.   Treatment Plan Summary: -Discontinue Celexa and Depakote since patient has not been taking these medications and would like his medication regimen simplified.  -Increase Cymbalta 60 mg daily to 90 mg daily for depression and anxiety. -Continue Trazodone 150 mg qhs.  -Decrease Seroquel 600 mg daily to 100 mg q am, 150 mg q noon and 150 mg q evening. -Please have unit SW provide patient with resources for local therapists and psychiatrists. Please also provide any resources that may allow the patient to afford his medications. Discussed [employer] website with patient.  -Patient  is psychiatrically cleared. Psychiatry will sign off on patient at this time. Please consult psychiatry again as needed.    Disposition: No evidence of imminent risk to self or others at present.   Patient does not meet criteria for psychiatric inpatient admission.  Faythe Dingwall, DO 01/13/2017 9:50 AM

## 2017-01-14 DIAGNOSIS — R768 Other specified abnormal immunological findings in serum: Secondary | ICD-10-CM

## 2017-01-14 DIAGNOSIS — L03116 Cellulitis of left lower limb: Secondary | ICD-10-CM

## 2017-01-14 DIAGNOSIS — F331 Major depressive disorder, recurrent, moderate: Secondary | ICD-10-CM

## 2017-01-14 DIAGNOSIS — G894 Chronic pain syndrome: Secondary | ICD-10-CM

## 2017-01-14 DIAGNOSIS — B1920 Unspecified viral hepatitis C without hepatic coma: Secondary | ICD-10-CM

## 2017-01-14 DIAGNOSIS — F1990 Other psychoactive substance use, unspecified, uncomplicated: Secondary | ICD-10-CM

## 2017-01-14 DIAGNOSIS — F1199 Opioid use, unspecified with unspecified opioid-induced disorder: Secondary | ICD-10-CM

## 2017-01-14 DIAGNOSIS — I10 Essential (primary) hypertension: Secondary | ICD-10-CM

## 2017-01-14 DIAGNOSIS — E1121 Type 2 diabetes mellitus with diabetic nephropathy: Secondary | ICD-10-CM

## 2017-01-14 DIAGNOSIS — L03119 Cellulitis of unspecified part of limb: Secondary | ICD-10-CM

## 2017-01-14 DIAGNOSIS — F1190 Opioid use, unspecified, uncomplicated: Secondary | ICD-10-CM

## 2017-01-14 DIAGNOSIS — E11628 Type 2 diabetes mellitus with other skin complications: Secondary | ICD-10-CM

## 2017-01-14 DIAGNOSIS — L02519 Cutaneous abscess of unspecified hand: Secondary | ICD-10-CM

## 2017-01-14 LAB — CBC
HCT: 40.9 % (ref 39.0–52.0)
Hemoglobin: 13.6 g/dL (ref 13.0–17.0)
MCH: 31.1 pg (ref 26.0–34.0)
MCHC: 33.3 g/dL (ref 30.0–36.0)
MCV: 93.6 fL (ref 78.0–100.0)
Platelets: 252 10*3/uL (ref 150–400)
RBC: 4.37 MIL/uL (ref 4.22–5.81)
RDW: 13.4 % (ref 11.5–15.5)
WBC: 7.9 10*3/uL (ref 4.0–10.5)

## 2017-01-14 LAB — COMPREHENSIVE METABOLIC PANEL
ALBUMIN: 3.1 g/dL — AB (ref 3.5–5.0)
ALK PHOS: 51 U/L (ref 38–126)
ALT: 17 U/L (ref 17–63)
AST: 20 U/L (ref 15–41)
Anion gap: 6 (ref 5–15)
BILIRUBIN TOTAL: 0.4 mg/dL (ref 0.3–1.2)
BUN: 15 mg/dL (ref 6–20)
CALCIUM: 8.8 mg/dL — AB (ref 8.9–10.3)
CO2: 26 mmol/L (ref 22–32)
CREATININE: 0.8 mg/dL (ref 0.61–1.24)
Chloride: 105 mmol/L (ref 101–111)
GFR calc Af Amer: >60 mL/min (ref >60)
GFR calc non Af Amer: >60 mL/min (ref >60)
GLUCOSE: 252 mg/dL — AB (ref 65–99)
Potassium: 4.1 mmol/L (ref 3.5–5.1)
SODIUM: 137 mmol/L (ref 135–145)
TOTAL PROTEIN: 7.3 g/dL (ref 6.5–8.1)

## 2017-01-14 LAB — GLUCOSE, CAPILLARY
GLUCOSE-CAPILLARY: 146 mg/dL — AB (ref 65–99)
GLUCOSE-CAPILLARY: 221 mg/dL — AB (ref 65–99)
Glucose-Capillary: 292 mg/dL — ABNORMAL HIGH (ref 65–99)

## 2017-01-14 LAB — HCV RNA QUANT
HCV QUANT LOG: 6.517 {Log_IU}/mL (ref >1.70)
HCV Quantitative: 3290000 IU/mL (ref >50)

## 2017-01-14 LAB — MAGNESIUM: Magnesium: 2 mg/dL (ref 1.7–2.4)

## 2017-01-14 MED ORDER — POLYETHYLENE GLYCOL 3350 17 GM/SCOOP PO POWD: 17.0000 g | Freq: Every day | ORAL | 0 refills | Status: DC

## 2017-01-14 MED ORDER — DOXYCYCLINE HYCLATE 100 MG PO TABS
100.0000 mg | ORAL_TABLET | Freq: Two times a day (BID) | ORAL | Status: DC
  Administered 2017-01-14: 100 mg via ORAL
  Filled 2017-01-14: qty 1

## 2017-01-14 MED ORDER — QUETIAPINE FUMARATE 100 MG PO TABS: 100.0000 mg | ORAL_TABLET | Freq: Three times a day (TID) | ORAL | 0 refills | Status: DC

## 2017-01-14 MED ORDER — TRAZODONE HCL 150 MG PO TABS: 150.0000 mg | ORAL_TABLET | Freq: Every day | ORAL | 0 refills | Status: DC

## 2017-01-14 MED ORDER — AMOXICILLIN-POT CLAVULANATE 875-125 MG PO TABS: 1.0000 | ORAL_TABLET | Freq: Two times a day (BID) | ORAL | 0 refills | Status: AC

## 2017-01-14 MED ORDER — DOXYCYCLINE HYCLATE 100 MG PO TABS: 100.0000 mg | ORAL_TABLET | Freq: Two times a day (BID) | ORAL | 0 refills | Status: AC

## 2017-01-14 MED ORDER — LEVOFLOXACIN 750 MG PO TABS
750.0000 mg | ORAL_TABLET | Freq: Every day | ORAL | Status: DC
  Administered 2017-01-14: 750 mg via ORAL
  Filled 2017-01-14: qty 1

## 2017-01-14 MED ORDER — AMOXICILLIN-POT CLAVULANATE 875-125 MG PO TABS
1.0000 | ORAL_TABLET | Freq: Two times a day (BID) | ORAL | Status: DC
  Administered 2017-01-14: 1 via ORAL
  Filled 2017-01-14: qty 1

## 2017-01-14 MED ORDER — DULOXETINE HCL 30 MG PO CPEP: 90.0000 mg | ORAL_CAPSULE | Freq: Every day | ORAL | 0 refills | Status: DC

## 2017-01-14 MED ORDER — SENNA 8.6 MG PO TABS: 1.0000 | ORAL_TABLET | Freq: Two times a day (BID) | ORAL | 0 refills | Status: DC

## 2017-01-14 MED ORDER — TIZANIDINE HCL 2 MG PO TABS: 1.0000 mg | ORAL_TABLET | Freq: Three times a day (TID) | ORAL | 0 refills | Status: DC | PRN

## 2017-01-14 NOTE — Consult Note (Addendum)
Date of Admission:  01/10/2017          Reason for Consult: Diabetic foot infection, IVDU, hand infection   Referring Provider: Dr Malachi Bonds   Assessment: 1. Diabetic foot infection after stepping on glass responding to IV abx 2. Soft tissue left hand infection 3. IVDU 4. Somnolence due to sedation + likely OSA 5. Obesity with OSA 6. Hepatitis C antibody +  Plan: 1. DC IV abx 2. Change to augmentin 875/125 mg po bid daily  doxycyline 100mg  BID x 14 days (NOTE I had initially thought to try levaquin but given MULTIPLE QT prolonging agents and absence of clear cut need of pseudomonal coverage will go to augmentin instead which will get anaerobes alogn with strep, staph 3. Followup with PCP and Orthopedics and certainly we are happy to see the pt 4. Pt should get into suboxone clinic 5. Check HCV RNA to see if he has actual infection vs has cleared this. Would be happy to treat this as well if it is chronic  Principal Problem:   MDD (major depressive disorder), recurrent episode, moderate (HCC) Active Problems:   OSA on CPAP   Essential hypertension, benign   Diabetes mellitus type II, controlled (HCC)   Polysubstance dependence including opioid type drug, episodic abuse (HCC)   Chronic pain syndrome   Cellulitis in diabetic foot (HCC)   Diabetic foot ulcer (HCC)   Scheduled Meds: . amphetamine-dextroamphetamine  60 mg Oral Q breakfast   And  . amphetamine-dextroamphetamine  30 mg Oral QPM  . atorvastatin  20 mg Oral QHS  . Chlorhexidine Gluconate Cloth  6 each Topical Q0600  . cloNIDine  0.2 mg Oral BID  . doxycycline  100 mg Oral Q12H  . DULoxetine  90 mg Oral Daily  . famotidine  10 mg Oral Daily  . feeding supplement (PRO-STAT SUGAR FREE 64)  30 mL Oral BID  . furosemide  40 mg Oral Daily  . gabapentin  800 mg Oral TID  . insulin aspart  0-15 Units Subcutaneous TID WC  . insulin aspart  0-5 Units Subcutaneous QHS  . levofloxacin  750 mg Oral Daily  .  mometasone-formoterol  2 puff Inhalation BID  . mupirocin ointment  1 application Nasal BID  . pantoprazole  40 mg Oral Daily  . protein supplement shake  11 oz Oral Q24H  . QUEtiapine  100 mg Oral Daily   And  . QUEtiapine  150 mg Oral Q1400   And  . QUEtiapine  150 mg Oral Daily  . senna  1 tablet Oral BID  . sodium chloride flush  3 mL Intravenous Q12H  . traZODone  150 mg Oral QHS  . ursodiol  300 mg Oral BID   Continuous Infusions: . sodium chloride     PRN Meds:.sodium chloride, albuterol, bisacodyl, LORazepam, ondansetron **OR** ondansetron (ZOFRAN) IV, oxycodone, polyethylene glycol, sodium chloride flush, tiZANidine  HPI: JIE STICKELS is a 41 y.o. male with DM, depression, morbid obesity with OSA, IVDU. He claims not to have used much until his birthday when he used IV drugs with this wife. This was roughly 8 days ago. 3 days prior to admission he went to [facility] ED after having stepped on glass and devleoped purulent drainage. They gave him keflex which he claims to have taken but only after a few doses already feelign worse with "cottage cheese" drainage from wound with fevers, nausea and malaise. He also noted painful area that "popped up on his  hand.. Plain films of the foot show foreign body and ulceration but no osteomyelitis. His hand film right hand also shows a foreign body.  He has been on broad spectrum abx and has improved. Dr. Luiz Blare has seen from orthopedics and sees no need for orthopedic intervention.  I will change him to augmentin 875/125 bid  and doxycyline 100mg  po bid to give him 2 bio-available abx that will cover MRSA, strep anerobes He will need to pay close attention to the hand and the foot in case either enlarge and require I and D.  I have counseled him on the grave dangers of continued IVDU  I would give him 2 weeks of antibiotics and have him followup with PCP, Orthopedics and we would be happy to see him as well.  Please call with further  questions. I will sign off for now.     Review of Systems: Review of Systems  Constitutional: Positive for diaphoresis, fever and malaise/fatigue. Negative for chills and weight loss.  HENT: Negative for congestion, hearing loss, sore throat and tinnitus.   Eyes: Negative for blurred vision and double vision.  Respiratory: Negative for cough, sputum production, shortness of breath and wheezing.   Cardiovascular: Negative for chest pain, palpitations and leg swelling.  Gastrointestinal: Positive for nausea. Negative for abdominal pain, blood in stool, constipation, diarrhea, heartburn, melena and vomiting.  Genitourinary: Negative for dysuria, flank pain and hematuria.  Musculoskeletal: Positive for myalgias. Negative for back pain, falls and joint pain.  Skin: Negative for itching and rash.  Neurological: Negative for dizziness, sensory change, focal weakness, loss of consciousness, weakness and headaches.  Endo/Heme/Allergies: Does not bruise/bleed easily.  Psychiatric/Behavioral: Positive for depression and substance abuse. Negative for memory loss and suicidal ideas. The patient is not nervous/anxious.     Past Medical History:  Diagnosis Date  . Adult ADHD   . Anaphylactic reaction   . Anxiety   . Asthma   . Bipolar disorder (HCC)   . Complication of anesthesia    Per patient difficult intubation;  . Diabetes mellitus   . Difficult intubation    Per patient  . Drug overdose   . Heart valve disorder    s/p echocardiogram  . Hyperlipidemia   . Hypertension   . Morbidly obese (HCC)   . OSA (obstructive sleep apnea)   . Polysubstance abuse (HCC)   . Transient cerebral ischemia    Unknown    Social History   Tobacco Use  . Smoking status: Current Every Day Smoker    Packs/day: 0.50    Years: 23.00    Pack years: 11.50    Types: Cigarettes  . Smokeless tobacco: Never Used  Substance Use Topics  . Alcohol use: No    Alcohol/week: 0.0 oz  . Drug use: No     Family History  Problem Relation Age of Onset  . CAD Mother        Living  . Diabetes Mellitus II Mother   . Stroke Mother   . Hypertension Mother   . Congestive Heart Failure Mother   . Kidney disease Mother   . Fibromyalgia Mother   . Thyroid disease Mother   . Hyperlipidemia Mother   . Liver disease Mother   . Alcoholism Father 49       Deceased  . Arthritis Maternal Grandmother   . Congestive Heart Failure Maternal Grandmother   . Hypertension Maternal Grandmother   . Lung cancer Maternal Grandfather   . Colon cancer Maternal Aunt   .  Stomach cancer Maternal Aunt   . Heart disease Other        Paternal & Maternal  . Crohn's disease Other   . Hypertension Other        Paternal & Maternal  . Hypertension Brother        x3  . Hypertension Sister        #1  . Bipolar disorder Sister        #1  . ADD / ADHD Son        x3  . Bipolar disorder Son        x3  . Asperger's syndrome Son    Allergies  Allergen Reactions  . Ace Inhibitors Anaphylaxis  . Atenolol Anaphylaxis  . Lisinopril Anaphylaxis  . Prednisone Hives  . Ibuprofen Itching, Swelling and Other (See Comments)    Hand/feet swelling   . Tylenol [Acetaminophen] Other (See Comments)    Reaction:  GI bleeding     OBJECTIVE: Blood pressure (!) 141/74, pulse 80, temperature 98 F (36.7 C), temperature source Oral, resp. rate 20, height 6\' 2"  (1.88 m), weight (!) 348 lb 5.2 oz (158 kg), SpO2 95 %.  Physical Exam  Constitutional: He is oriented to person, place, and time and well-developed, well-nourished, and in no distress. No distress.  HENT:  Head: Normocephalic and atraumatic.  Right Ear: External ear normal.  Left Ear: External ear normal.  Mouth/Throat: Oropharynx is clear and moist.  Eyes: Conjunctivae and EOM are normal. No scleral icterus.  Neck: Normal range of motion. Neck supple.  Cardiovascular: Normal rate, regular rhythm and normal heart sounds.  Pulmonary/Chest: Effort normal and  breath sounds normal. No respiratory distress. He has no wheezes.  Abdominal: Soft. Bowel sounds are normal. He exhibits no distension.  Musculoskeletal: Normal range of motion. He exhibits tenderness.       Hands:      Feet:  Neurological: He is oriented to person, place, and time. Gait normal. Coordination normal.  Somnolent but arousable  Skin: Skin is warm and dry. No rash noted. He is not diaphoretic. No erythema. No pallor.  Psychiatric: Mood, memory, affect and judgment normal.    Lab Results Lab Results  Component Value Date   WBC 7.9 01/14/2017   HGB 13.6 01/14/2017   HCT 40.9 01/14/2017   MCV 93.6 01/14/2017   PLT 252 01/14/2017    Lab Results  Component Value Date   CREATININE 0.80 01/14/2017   BUN 15 01/14/2017   NA 137 01/14/2017   K 4.1 01/14/2017   CL 105 01/14/2017   CO2 26 01/14/2017    Lab Results  Component Value Date   ALT 17 01/14/2017   AST 20 01/14/2017   ALKPHOS 51 01/14/2017   BILITOT 0.4 01/14/2017     Microbiology: Recent Results (from the past 240 hour(s))  Culture, blood (routine x 2)     Status: None (Preliminary result)   Collection Time: 01/10/17  7:51 PM  Result Value Ref Range Status   Specimen Description BLOOD LEFT ARM  Final   Special Requests   Final    BOTTLES DRAWN AEROBIC AND ANAEROBIC Blood Culture adequate volume   Culture   Final    NO GROWTH 4 DAYS Performed at [facility], [address] [place], [place] [address]    Report Status PENDING  Incomplete  Culture, blood (routine x 2)     Status: None (Preliminary result)   Collection Time: 01/10/17 11:25 PM  Result Value Ref  Range Status   Specimen Description BLOOD RIGHT HAND  Final   Special Requests IN PEDIATRIC BOTTLE Blood Culture adequate volume  Final   Culture   Final    NO GROWTH 3 DAYS Performed at [facility], [address] [place], [place] [address]    Report Status PENDING  Incomplete  MRSA PCR Screening     Status: Abnormal    Collection Time: 01/11/17  3:12 AM  Result Value Ref Range Status   MRSA by PCR POSITIVE (A) NEGATIVE Final    Comment:        The GeneXpert MRSA Assay (FDA approved for NASAL specimens only), is one component of a comprehensive MRSA colonization surveillance program. It is not intended to diagnose MRSA infection nor to guide or monitor treatment for MRSA infections. RESULT CALLED TO, READ BACK BY AND VERIFIED WITH: Conrad BurlingtonH RICHARD [phone] 01/11/17 A NAVARRO     Acey Lavornelius Van Dam, MD [facility] [phone] pager   [phone] cell 01/14/2017, 2:14 PM

## 2017-01-14 NOTE — Care Management Important Message (Signed)
Important Message  Patient Details IM Letter given to Nora/Case Management to present to the Patient Name: Bryan Gallegos MRN: [id number] Date of Birth: 03/25/1975   Medicare Important Message Given:  Yes    Caren MacadamFuller, [name] 01/14/2017, 12:20 PMImportant Message  Patient Details  Name: Bryan Gallegos MRN: [id number] Date of Birth: 04/02/1975   Medicare Important Message Given:  Yes    Caren MacadamFuller, [name] 01/14/2017, 12:20 PM

## 2017-01-14 NOTE — Care Management Note (Signed)
Case Management Note  Patient Details  Name: Bryan Gallegos MRN: [id number] Date of Birth: 11/16/1975  Subjective/Objective:     41 yo admitted with cellulitis.  HX of HTN, HL, DM2 and IV drug abuse, Anxiety; Asthma; Bipolar disorder (HCC), Morbidly obese, OSA  Action/Plan: From home alone. PT/OT with no f/u recommended. [facility]. Unsure if pt will need assistance with wound care at this time as wound is very small that is being dressed. Will need HHRN orders for wound care if needed. CM will continue to follow.  Expected Discharge Date:  (unknown)               Expected Discharge Plan:  Home/Self Care  In-House Referral:     Discharge planning Services  CM Consult  Post Acute Care Choice:    Choice offered to:     DME Arranged:    DME Agency:     HH Arranged:    HH Agency:     Status of Service:  In process, will continue to follow  If discussed at Long Length of Stay Meetings, dates discussed:    Additional Comments:  Bartholome BillCLEMENTS, [name], RN 01/14/2017, 12:20 PM

## 2017-01-14 NOTE — Progress Notes (Signed)
OT Cancellation Note  Patient Details Name: Bryan Gallegos MRN: [id number] DOB: 09/25/1975   Cancelled Treatment:    Reason Eval/Treat Not Completed: Other (comment).  Noted OT order is for surgical boot.  Bryan AlbertFred RN will order from ortho tech.  Called to let Bryan Gallegos from wound care team know that this is what is being done. Will sign off.   Bryan Gallegos 01/14/2017, 2:39 PM  Bryan Gallegos, OTR/L [phone] 01/14/2017

## 2017-01-14 NOTE — Consult Note (Signed)
WOC Nurse wound consult note Reason for Consult:footwear suggestions Wound type:trauma, foreign body Pressure Injury POA: NA Measurement:see prior note Wound WUJ:WJXBbed:pink Drainage (amount, consistency, odor) scant serosanguinous  Periwound:erythema with induration Dressing procedure/placement/frequency: consult already completed for wound care orders. This consult ordered for foot wear suggestions. I will have OT bring a post op walking shoe for pt to try. Pt has already been seen by Dr. Luiz BlareGraves, Ortho. Will leave a message to inquire if he has any different ideas of a more suitable footwear product. We will not follow, but will remain available to this patient, to nursing, and the medical and/or surgical teams.  Please re-consult if we need to assist further.   Barnett HatterMelinda Noeh Sparacino, RN-C, WTA-C Wound Treatment Associate

## 2017-01-14 NOTE — Discharge Summary (Addendum)
Physician Discharge Summary  Bryan Gallegos WGN:[id number] DOB: 07/01/1975 DOA: 01/10/2017  PCP: System, Provider Not In  Admit date: 01/10/2017 Discharge date: 01/14/2017  Admitted From: home  Disposition:  home  Recommendations for Outpatient Follow-up:  1. Follow up with Dr. Luiz BlareGraves in 1-2 weeks  2. Follow up with Dr. Daiva EvesVan Dam in 2 weeks 3. Follow up with pain management, Dr. Idalia NeedlePaige Motsinger in 2 weeks.  Recommend tapering oxycodone and xanax.  Patient admits to ongoing IVDU.  Question if he is diverting his medications given his level of sedation on what appear to be chronic doses.   4. Given Augmentin and Doxycycline to continue x 14 days  Home Health:  none  Equipment/Devices:  Wound care to full thickness wound on lateral left foot:  Cleanse with NS, pat gently dry. Cover with single saline dampened gauze 2x2, top with dry gauze 2x2s and secure with a few turns of Kerlix roll gauze wrap/paper tape.  Discharge Condition:  Stable, improved CODE STATUS:  Full code  Diet recommendation:  Diabetic diet   Brief/Interim Summary:  Bryan Gallegos a 41 y.o.male PMHx TIA, IV Drug Abuse, Drug Overdose, HTN, Anxiety, Bipolar Disorder, DM Type 2, Asthma; OSA/OHS,/Morbidly obese who presented with a 5-day history of left foot pain after stepping on something.  Initially he was found to have his shoes stuck to his foot when [facility].  He was given prescriptions for Keflex and discharged from ER 3 days ago but continues to have pain patient was noted walking outside in the rain barefoot. He reported fevers in wound on the left foot, draining cottage cheese.  Last IV drug use on the eighth injects into his right arm.  Reports he have had some ulcers popping up in different areas including face, mouth hands.  Discharge Diagnoses:  Principal Problem:   MDD (major depressive disorder), recurrent episode, moderate (HCC) Active Problems:   OSA on CPAP   Essential  hypertension, benign   Diabetes mellitus type II, controlled (HCC)   Polysubstance dependence including opioid type drug, episodic abuse (HCC)   Chronic pain syndrome   Cellulitis in diabetic foot (HCC)   Diabetic foot ulcer (HCC)   Cellulitis of left lower extremity   Hand abscess   IVDU (intravenous drug user)   Hepatitis C antibody positive in blood  Diabetic foot ulcer/cellulitis diabetic foot -Per orthopedic surgery note 11/13 No surgical intervention at this time. Dr. Jodi GeraldsJohn Graves believes foreign body is chronic -  F/u with Dr. Luiz BlareGraves in 2 weeks -11/12 CRP elevated at 5.4 -ABI was normal, no evidence of significant obstruction Right 1.11, left 1.27 -Echocardiogram showed no evidence of valvular stenosis. -ID recommended Augmentin + doxycycline x 14 days, Rx provided -Wound care consulted recommended dressing changes and post-op shoe  Polysubstance abuse/IV Drug Abuse -IV drug abuser who shared needles with his wife. Per son [name] mother passed couple months ago secondary to what sounds like spinal abscess most likely secondary to IV drug use per son. -HIV negative  Hepatitis C infection Hepatitis C antibody positive and HCV quant 3,290,000, HCV quantitative log 6.517 -  F/u with Infectious disease  Acute toxic encephalopathy secondary to benzodiazepines and narcotics.  Patient was continued on oxycodone 30 mg tabs 5 times daily and Xanax 3 times daily as he reported and as was documented in the [employer] [place] drug database.  On these doses, however, he was very somnolent.  His oxycodone was reduced to 10 mg every 4 hours  as needed and his Xanax was changed to Ativan 1mg  twice daily as needed.  He was much more alert on these doses.  I have not given him a new prescription for narcotic medications because it appears he already got a 30-day supply of Xanax and high-dose oxycodone on 3 weeks ago by Dr. Jacqulyn Ducking.  Patient to follow up with Dr. Jacqulyn Ducking for reevaluation in 1-2  weeks.  Recommend tapering dose of narcotics.   -ABG WNL - Zanaflex 1 mg TID PRN  Bipolar, Anxiety -Per patient has not been taking prescribed psychiatric medication. Consulted psychiatry for help with restarting appropriate medication and doses. Per psychiatry -Cymbalta increased to 90 mg daily -Decreased Seroquel 100 mg qAm, 150 mg q noon, 150 mg qPm -DC Depakote and celexa -Trazodone increased to 150 mg QHS   Diabetes Type 2 controlled with complication -Moderate SSI  Chronic pain syndrome -See polysubstance abuse/altered mental status  OSA/OHS -BiPAP offered, patient noncompliant  Chronic diastolic CHF -Clonidine 0.2 mg BID -Lasix 40 mg daily  Essential HTN -stopped ARB but continued beta blocker and clonidine   Discharge Instructions     Medication List    STOP taking these medications   aspirin 325 MG tablet   cephALEXin 500 MG capsule Commonly known as:  KEFLEX   citalopram 20 MG tablet Commonly known as:  CELEXA   divalproex 500 MG DR tablet Commonly known as:  DEPAKOTE   sulfamethoxazole-trimethoprim 800-160 MG tablet Commonly known as:  BACTRIM DS,SEPTRA DS   valsartan 160 MG tablet Commonly known as:  DIOVAN     TAKE these medications   albuterol 108 (90 Base) MCG/ACT inhaler Commonly known as:  PROVENTIL HFA;VENTOLIN HFA Inhale 1-2 puffs into the lungs every 6 (six) hours as needed for wheezing or shortness of breath.   alprazolam 2 MG tablet Commonly known as:  XANAX Take 2 mg by mouth 3 (three) times daily.   amoxicillin-clavulanate 875-125 MG tablet Commonly known as:  AUGMENTIN Take 1 tablet every 12 (twelve) hours for 14 days by mouth.   amphetamine-dextroamphetamine 30 MG tablet Commonly known as:  ADDERALL Take 30-60 mg by mouth 2 (two) times daily. Pt takes two tablets in the morning and one in the evening.   atorvastatin 20 MG tablet Commonly known as:  LIPITOR Take 20 mg by mouth at bedtime.   cloNIDine 0.2 MG  tablet Commonly known as:  CATAPRES Take 0.2 mg by mouth 2 (two) times daily.   COMBIVENT RESPIMAT 20-100 MCG/ACT Aers respimat Generic drug:  Ipratropium-Albuterol Take 1 puff by mouth every 6 (six) hours as needed for wheezing or shortness of breath.   doxycycline 100 MG tablet Commonly known as:  VIBRA-TABS Take 1 tablet (100 mg total) every 12 (twelve) hours for 14 days by mouth.   DULoxetine 30 MG capsule Commonly known as:  CYMBALTA Take 3 capsules (90 mg total) daily by mouth. What changed:    medication strength  how much to take   Fluticasone-Salmeterol 250-50 MCG/DOSE Aepb Commonly known as:  ADVAIR Inhale 1 puff into the lungs 2 (two) times daily.   furosemide 40 MG tablet Commonly known as:  LASIX Take 40 mg by mouth daily.   gabapentin 400 MG capsule Commonly known as:  NEURONTIN Take 800 mg by mouth 3 (three) times daily.   metFORMIN 1000 MG tablet Commonly known as:  GLUCOPHAGE Take 1 tablet (1,000 mg total) by mouth 2 (two) times daily.   metoprolol tartrate 50 MG tablet Commonly known as:  LOPRESSOR Take 50 mg by mouth 2 (two) times daily.   omeprazole 40 MG capsule Commonly known as:  PRILOSEC Take 1 capsule (40 mg total) by mouth 2 (two) times daily.   oxycodone 30 MG immediate release tablet Commonly known as:  ROXICODONE Take 30 mg by mouth 5 (five) times daily as needed for pain.   polyethylene glycol powder powder Commonly known as:  MIRALAX Take 17 g daily by mouth.   QUEtiapine 100 MG tablet Commonly known as:  SEROQUEL Take 1-3 tablets (100-300 mg total) 3 (three) times daily by mouth. Take 1 tablet at breakfast and 1 and a half tabs with lunch and dinner What changed:  additional instructions   ranitidine 150 MG tablet Commonly known as:  ZANTAC Take 150 mg by mouth 2 (two) times daily.   senna 8.6 MG Tabs tablet Commonly known as:  SENOKOT Take 1 tablet (8.6 mg total) 2 (two) times daily by mouth.   sitaGLIPtin 100 MG  tablet Commonly known as:  JANUVIA Take 100 mg by mouth daily.   testosterone cypionate 200 MG/ML injection Commonly known as:  DEPOTESTOSTERONE CYPIONATE Inject 200 mg into the muscle every 30 (thirty) days.   tiZANidine 2 MG tablet Commonly known as:  ZANAFLEX Take 0.5 tablets (1 mg total) 3 (three) times daily as needed by mouth for muscle spasms. What changed:    medication strength  how much to take   traZODone 150 MG tablet Commonly known as:  DESYREL Take 1 tablet (150 mg total) at bedtime by mouth. What changed:    medication strength  how much to take  additional instructions   ursodiol 300 MG capsule Commonly known as:  ACTIGALL Take 300 mg by mouth 2 (two) times daily.      Follow-up Information    Sanjuana LettersGraves, Benjamin, MD. Schedule an appointment as soon as possible for a visit in 1 week(s).   Specialty:  Orthopedic Surgery Contact information: [address] [place] [id number] [phone]        Daiva EvesVan Dam, Lisette Grinderornelius N, MD. Schedule an appointment as soon as possible for a visit in 2 week(s).   Specialty:  Infectious Diseases Contact information: [address] [place] [address] [phone]          Allergies  Allergen Reactions  . Ace Inhibitors Anaphylaxis  . Atenolol Anaphylaxis  . Lisinopril Anaphylaxis  . Prednisone Hives  . Ibuprofen Itching, Swelling and Other (See Comments)    Hand/feet swelling   . Tylenol [Acetaminophen] Other (See Comments)    Reaction:  GI bleeding     Consultations: Dr. Luiz BlareGraves, Orthopedics Dr. Sharma CovertNorman, Psychiatry Dr. Daiva EvesVan Dam, ID   Procedures/Studies: Dg Hand Complete Right  Result Date: 01/10/2017 CLINICAL DATA:  Right hand redness, infection EXAM: RIGHT HAND - COMPLETE 3+ VIEW COMPARISON:  06/07/2014 FINDINGS: Diffuse soft tissue swelling. No fracture or malalignment. No periostitis or bone destruction. 11 mm linear radiopaque foreign body within the soft tissues along the  ulnar aspect of the wrist. IMPRESSION: 1. No acute osseous abnormality. 2. 11 mm linear radiopaque foreign body within the soft tissues along the ulnar aspect of the wrist. Electronically Signed   By: Jasmine PangKim  Fujinaga M.D.   On: 01/10/2017 22:03   Dg Foot Complete Left  Result Date: 01/10/2017 CLINICAL DATA:  41 year old male with history of diabetes and hypertension presenting with left foot pain and swelling. EXAM: LEFT FOOT - COMPLETE 3+ VIEW COMPARISON:  Left foot radiograph dated 01/07/2017 FINDINGS:  There is no acute fracture or dislocation. No significant arthritic changes. There is diffuse soft tissue swelling of the foot primarily over the forefoot. A tiny radiopaque focus is seen in the superficial soft tissues of the plantar aspect of the hindfoot as seen on the prior radiograph. There is a focal area of soft tissue irregularity along the plantar aspect of the lateral foot seen on the oblique view which may represent a wound or ulcer. Clinical correlation is recommended. No soft tissue gas. IMPRESSION: 1. No acute fracture or dislocation. 2. Diffuse soft tissue swelling of the foot primarily over the forefoot. Clinical correlation is recommended. 3. Small radiopaque focus in the superficial soft tissues of the plantar hindfoot similar to prior radiograph. 4. Focal skin irregularity along the lateral plantar foot may represent an ulcer. Clinical correlation is recommended. No soft tissue gas. Electronically Signed   By: Elgie Collard M.D.   On: 01/10/2017 19:53   Dg Foot Complete Left  Result Date: 01/07/2017 CLINICAL DATA:  Lateral foot wound after stepping on object 2 days ago. Diabetes. EXAM: LEFT FOOT - COMPLETE 3+ VIEW COMPARISON:  None. FINDINGS: No acute fracture deformity or dislocation. No destructive bony lesions. Os peroneum. Tiny triangular glass like foreign body projecting within 6 mm of plantar skin surface at the level of the mid calcaneus. No subcutaneous gas. IMPRESSION: Tiny  hindfoot plantar radiopaque foreign body, likely glass. No acute osseous process. Electronically Signed   By: Awilda Metro M.D.   On: 01/07/2017 22:27    Subjective: Ongoing pain in the left foot, but overall foot is looking and feeling better.  Less swollen and less drainage than before.  Less red also.  Right hand is also better but still somewhat red.  He is not bothered by his right hand.    Discharge Exam: Vitals:   01/14/17 1120 01/14/17 1435  BP:  135/85  Pulse:  89  Resp:  20  Temp:  98.2 F (36.8 C)  SpO2: 95% 100%   Vitals:   01/13/17 0754 01/13/17 2103 01/14/17 1120 01/14/17 1435  BP:  (!) 141/74  135/85  Pulse:  80  89  Resp:  20  20  Temp:  98 F (36.7 C)  98.2 F (36.8 C)  TempSrc:  Oral  Oral  SpO2: 97% 97% 95% 100%  Weight:      Height:        General: Pt is alert, awake, not in acute distress Cardiovascular: RRR, S1/S2 +, no rubs, no gallops Respiratory: CTA bilaterally, no wheezing, no rhonchi Abdominal: Soft, NT, ND, bowel sounds + Extremities: no edema, no cyanosis.  Right hand, ulnar aspect with 1-2 cm eschar covered ulcer with some surrounding erythema and an area where there are some small flakes of pus just under the skin.  Left hand has a small eschar or scab area on the dorsum of his hand which appears non-erythematous and without swelling.  His left foot has a 1-2 cm ulcer that is superficial on the left lateral aspect with some surrounding erythema and some superficial leaks of pus.  He has a small amount of swelling in this area.    The results of significant diagnostics from this hospitalization (including imaging, microbiology, ancillary and laboratory) are listed below for reference.     Microbiology: Recent Results (from the past 240 hour(s))  Culture, blood (routine x 2)     Status: None (Preliminary result)   Collection Time: 01/10/17  7:51 PM  Result Value Ref  Range Status   Specimen Description BLOOD LEFT ARM  Final   Special  Requests   Final    BOTTLES DRAWN AEROBIC AND ANAEROBIC Blood Culture adequate volume   Culture   Final    NO GROWTH 4 DAYS Performed at [facility], [address], [place], [place] [address]    Report Status PENDING  Incomplete  Culture, blood (routine x 2)     Status: None (Preliminary result)   Collection Time: 01/10/17 11:25 PM  Result Value Ref Range Status   Specimen Description BLOOD RIGHT HAND  Final   Special Requests IN PEDIATRIC BOTTLE Blood Culture adequate volume  Final   Culture   Final    NO GROWTH 3 DAYS Performed at [facility], [address], [place], [place] [address]    Report Status PENDING  Incomplete  MRSA PCR Screening     Status: Abnormal   Collection Time: 01/11/17  3:12 AM  Result Value Ref Range Status   MRSA by PCR POSITIVE (A) NEGATIVE Final    Comment:        The GeneXpert MRSA Assay (FDA approved for NASAL specimens only), is one component of a comprehensive MRSA colonization surveillance program. It is not intended to diagnose MRSA infection nor to guide or monitor treatment for MRSA infections. RESULT CALLED TO, READ BACK BY AND VERIFIED WITH: H RICHARD 0542 01/11/17 A NAVARRO      Labs: BNP (last 3 results) Recent Labs    06/19/16 0133  BNP 16.2   Basic Metabolic Panel: Recent Labs  Lab 01/10/17 1951 01/11/17 0420 01/12/17 0419 01/13/17 0412 01/14/17 0415  NA 135 139 143 138 137  K 3.8 3.8 4.1 4.0 4.1  CL 100* 105 110 106 105  CO2 27 28 27 25 26   GLUCOSE 127* 143* 131* 175* 252*  BUN 8 8 12 13 15   CREATININE 0.87 0.89 0.93 0.82 0.80  CALCIUM 8.7* 8.9 8.9 8.8* 8.8*  MG  --  2.3  --  2.1 2.0  PHOS  --  3.3  --   --   --    Liver Function Tests: Recent Labs  Lab 01/10/17 1951 01/11/17 0420 01/13/17 0412 01/14/17 0415  AST 27 22 21 20   ALT 24 21 19 17   ALKPHOS 63 51 50 51  BILITOT 0.5 0.6 0.6 0.4  PROT 7.5 7.1 7.2 7.3  ALBUMIN 3.3* 3.1* 3.1* 3.1*   No results for input(s): LIPASE, AMYLASE in the  last 168 hours. No results for input(s): AMMONIA in the last 168 hours. CBC: Recent Labs  Lab 01/10/17 1951 01/11/17 0420 01/12/17 0419 01/13/17 0412 01/14/17 0415  WBC 8.8 6.9 7.1 7.8 7.9  NEUTROABS 5.2  --   --   --   --   HGB 13.9 12.9* 13.7 14.0 13.6  HCT 41.0 38.6* 41.2 42.2 40.9  MCV 92.6 91.7 92.6 93.0 93.6  PLT 223 215 218 226 252   Cardiac Enzymes: No results for input(s): CKTOTAL, CKMB, CKMBINDEX, TROPONINI in the last 168 hours. BNP: Invalid input(s): POCBNP CBG: Recent Labs  Lab 01/13/17 1200 01/13/17 1756 01/13/17 2030 01/14/17 0759 01/14/17 1222  GLUCAP 221* 155* 149* 146* 221*   D-Dimer No results for input(s): DDIMER in the last 72 hours. Hgb A1c No results for input(s): HGBA1C in the last 72 hours. Lipid Profile No results for input(s): CHOL, HDL, LDLCALC, TRIG, CHOLHDL, LDLDIRECT in the last 72 hours. Thyroid function studies No results for input(s): TSH,  T4TOTAL, T3FREE, THYROIDAB in the last 72 hours.  Invalid input(s): FREET3 Anemia work up No results for input(s): VITAMINB12, FOLATE, FERRITIN, TIBC, IRON, RETICCTPCT in the last 72 hours. Urinalysis    Component Value Date/Time   COLORURINE YELLOW 11/11/2016 1146   APPEARANCEUR CLEAR 11/11/2016 1146   LABSPEC 1.023 11/11/2016 1146   PHURINE 5.0 11/11/2016 1146   GLUCOSEU NEGATIVE 11/11/2016 1146   HGBUR NEGATIVE 11/11/2016 1146   BILIRUBINUR NEGATIVE 11/11/2016 1146   KETONESUR NEGATIVE 11/11/2016 1146   PROTEINUR NEGATIVE 11/11/2016 1146   UROBILINOGEN 1.0 08/18/2014 2002   NITRITE NEGATIVE 11/11/2016 1146   LEUKOCYTESUR NEGATIVE 11/11/2016 1146   Sepsis Labs Invalid input(s): PROCALCITONIN,  WBC,  LACTICIDVEN   Time coordinating discharge: Over 30 minutes  SIGNED:   Renae Fickle, MD  [facility] 01/14/2017, 5:20 PM Pager   If 7PM-7AM, please contact night-coverage [email or web address] Password [email or web address]

## 2017-01-14 NOTE — Discharge Instructions (Signed)

## 2017-01-15 LAB — CULTURE, BLOOD (ROUTINE X 2)
CULTURE: NO GROWTH
SPECIAL REQUESTS: ADEQUATE

## 2017-01-16 LAB — CULTURE, BLOOD (ROUTINE X 2)
Culture: NO GROWTH
SPECIAL REQUESTS: ADEQUATE

## 2017-01-18 LAB — HCV RNA QUANT RFLX ULTRA OR GENOTYP
HCV RNA QNT(LOG COPY/ML): 6.928 {Log_IU}/mL
HEPATITIS C QUANTITATION: 8470000 [IU]/mL

## 2017-01-18 LAB — HEPATITIS C GENOTYPE

## 2017-02-08 ENCOUNTER — Encounter (HOSPITAL_COMMUNITY): Payer: Self-pay | Admitting: Emergency Medicine

## 2017-02-08 ENCOUNTER — Emergency Department (HOSPITAL_COMMUNITY)
Admission: EM | Admit: 2017-02-08 | Discharge: 2017-02-09 | Disposition: A | Discharge status: Other Acute Inpatient Hospital | Payer: Medicare Other | Attending: Emergency Medicine | Admitting: Emergency Medicine

## 2017-02-08 DIAGNOSIS — Z7984 Long term (current) use of oral hypoglycemic drugs: Secondary | ICD-10-CM | POA: Diagnosis not present

## 2017-02-08 DIAGNOSIS — E119 Type 2 diabetes mellitus without complications: Secondary | ICD-10-CM | POA: Diagnosis not present

## 2017-02-08 DIAGNOSIS — F4325 Adjustment disorder with mixed disturbance of emotions and conduct: Secondary | ICD-10-CM | POA: Diagnosis not present

## 2017-02-08 DIAGNOSIS — Z79899 Other long term (current) drug therapy: Secondary | ICD-10-CM | POA: Diagnosis not present

## 2017-02-08 DIAGNOSIS — F17210 Nicotine dependence, cigarettes, uncomplicated: Secondary | ICD-10-CM | POA: Diagnosis not present

## 2017-02-08 DIAGNOSIS — R45851 Suicidal ideations: Secondary | ICD-10-CM | POA: Insufficient documentation

## 2017-02-08 DIAGNOSIS — I10 Essential (primary) hypertension: Secondary | ICD-10-CM | POA: Insufficient documentation

## 2017-02-08 DIAGNOSIS — F331 Major depressive disorder, recurrent, moderate: Secondary | ICD-10-CM | POA: Diagnosis present

## 2017-02-08 LAB — CBC
HCT: 43 % (ref 39.0–52.0)
Hemoglobin: 14.8 g/dL (ref 13.0–17.0)
MCH: 32 pg (ref 26.0–34.0)
MCHC: 34.4 g/dL (ref 30.0–36.0)
MCV: 93.1 fL (ref 78.0–100.0)
PLATELETS: 197 10*3/uL (ref 150–400)
RBC: 4.62 MIL/uL (ref 4.22–5.81)
RDW: 12.9 % (ref 11.5–15.5)
WBC: 9.2 10*3/uL (ref 4.0–10.5)

## 2017-02-08 LAB — ETHANOL

## 2017-02-08 LAB — COMPREHENSIVE METABOLIC PANEL
ALT: 23 U/L (ref 17–63)
AST: 26 U/L (ref 15–41)
Albumin: 3.8 g/dL (ref 3.5–5.0)
Alkaline Phosphatase: 58 U/L (ref 38–126)
Anion gap: 8 (ref 5–15)
BILIRUBIN TOTAL: 0.5 mg/dL (ref 0.3–1.2)
BUN: 12 mg/dL (ref 6–20)
CHLORIDE: 99 mmol/L — AB (ref 101–111)
CO2: 28 mmol/L (ref 22–32)
Calcium: 8.7 mg/dL — ABNORMAL LOW (ref 8.9–10.3)
Creatinine, Ser: 0.81 mg/dL (ref 0.61–1.24)
Glucose, Bld: 119 mg/dL — ABNORMAL HIGH (ref 65–99)
POTASSIUM: 4 mmol/L (ref 3.5–5.1)
Sodium: 135 mmol/L (ref 135–145)
TOTAL PROTEIN: 7.9 g/dL (ref 6.5–8.1)

## 2017-02-08 LAB — RAPID URINE DRUG SCREEN, HOSP PERFORMED
AMPHETAMINES: NOT DETECTED
BENZODIAZEPINES: NOT DETECTED
Barbiturates: NOT DETECTED
Cocaine: NOT DETECTED
OPIATES: NOT DETECTED
Tetrahydrocannabinol: NOT DETECTED

## 2017-02-08 LAB — SALICYLATE LEVEL

## 2017-02-08 LAB — ACETAMINOPHEN LEVEL: Acetaminophen (Tylenol), Serum: <10 ug/mL — ABNORMAL LOW (ref 10–30)

## 2017-02-08 MED ORDER — AMPHETAMINE-DEXTROAMPHETAMINE 20 MG PO TABS: 30.0000 mg | ORAL_TABLET | ORAL | Status: DC

## 2017-02-08 MED ORDER — QUETIAPINE FUMARATE 100 MG PO TABS
100.0000 mg | ORAL_TABLET | Freq: Three times a day (TID) | ORAL | Status: DC
  Administered 2017-02-08: 200 mg via ORAL
  Administered 2017-02-09: 100 mg via ORAL
  Filled 2017-02-08: qty 1
  Filled 2017-02-08: qty 2

## 2017-02-08 MED ORDER — AMPHETAMINE-DEXTROAMPHETAMINE 30 MG PO TABS: 30.0000 mg | ORAL_TABLET | Freq: Two times a day (BID) | ORAL | Status: DC

## 2017-02-08 MED ORDER — TRAZODONE HCL 50 MG PO TABS
150.0000 mg | ORAL_TABLET | Freq: Every day | ORAL | Status: DC
  Administered 2017-02-08: 23:00:00 150 mg via ORAL
  Filled 2017-02-08: qty 1

## 2017-02-08 MED ORDER — URSODIOL 300 MG PO CAPS
300.0000 mg | ORAL_CAPSULE | Freq: Two times a day (BID) | ORAL | Status: DC
  Administered 2017-02-09: 300 mg via ORAL
  Filled 2017-02-08 (×2): qty 1

## 2017-02-08 MED ORDER — ALPRAZOLAM 1 MG PO TABS
2.0000 mg | ORAL_TABLET | Freq: Three times a day (TID) | ORAL | Status: DC
  Administered 2017-02-08 – 2017-02-09 (×2): 2 mg via ORAL
  Filled 2017-02-08 (×2): qty 2

## 2017-02-08 MED ORDER — GABAPENTIN 400 MG PO CAPS
800.0000 mg | ORAL_CAPSULE | Freq: Three times a day (TID) | ORAL | Status: DC
  Administered 2017-02-08 – 2017-02-09 (×2): 800 mg via ORAL
  Filled 2017-02-08 (×2): qty 2

## 2017-02-08 MED ORDER — DULOXETINE HCL 30 MG PO CPEP
90.0000 mg | ORAL_CAPSULE | Freq: Every day | ORAL | Status: DC
  Administered 2017-02-09: 90 mg via ORAL
  Filled 2017-02-08: qty 3

## 2017-02-08 MED ORDER — ATORVASTATIN CALCIUM 20 MG PO TABS
20.0000 mg | ORAL_TABLET | Freq: Every day | ORAL | Status: DC
Start: 2017-02-09 — End: 2017-02-09

## 2017-02-08 MED ORDER — AMPHETAMINE-DEXTROAMPHETAMINE 20 MG PO TABS
60.0000 mg | ORAL_TABLET | Freq: Every day | ORAL | Status: DC
  Administered 2017-02-09: 60 mg via ORAL
  Filled 2017-02-08: qty 3

## 2017-02-08 NOTE — ED Notes (Signed)
Bed: [facility] Expected date:  Expected time:  Means of arrival:  Comments: 

## 2017-02-08 NOTE — ED Notes (Signed)
Patient changed and wanded by security. 2 bags of belongings at nurses station.

## 2017-02-08 NOTE — ED Notes (Signed)
Pt A&O x 3, no distress noted, calm & cooperative.  Presents with SI, no specific plan after loss of wife x 5 mos ago.  Pt reports feeling hopeless and hears voices.  Monitoring for safety, Q 15 min checks in effect.  Safety check for contraband completed, no items found.

## 2017-02-08 NOTE — BH Assessment (Signed)
[facility] Assessment Progress Note  Case discussed with Malachy Chamberakia Starkes, NP who recommends inpt treatment. EDP Dr. Particia NearingHaviland, MD notified of disposition. Pt's nurse are of recommendation. TTS to seek placement.  Princess BruinsAquicha Gianina Olinde, MSW, LCSW Therapeutic Triage Specialist  [phone]

## 2017-02-08 NOTE — ED Notes (Signed)
Phlebotomy went to draw blood from pt for labs. Pt said "oh no, you have to get the sonogram machine". Pt doesn't want tech to draw blood at this time.

## 2017-02-08 NOTE — ED Provider Notes (Signed)
[facility]-EMERGENCY DEPT Provider Note   CSN: [id number] Arrival date & time: 02/08/17  1813     History   Chief Complaint Chief Complaint  Patient presents with  . Suicidal    HPI Bryan Gallegos is a 41 y.o. male presenting with suicidal ideation with a plan to ingest medications. He reports attempting this multiple times over the past few months and being unsuccessful. He has been suffering from depression and grief over the loss of his wife passing 5 months ago. He reports ingesting his late wfe's blood pressure medications in an attempt to end his life. He reports that his medications have not helped him with depression and is requesting help. He denies any new physical symptoms. He has not attempted to ingest medications PTA.  HPI  Past Medical History:  Diagnosis Date  . Adult ADHD   . Anaphylactic reaction   . Anxiety   . Asthma   . Bipolar disorder (HCC)   . Complication of anesthesia    Per patient difficult intubation;  . Diabetes mellitus   . Difficult intubation    Per patient  . Drug overdose   . Heart valve disorder    s/p echocardiogram  . Hyperlipidemia   . Hypertension   . Morbidly obese (HCC)   . OSA (obstructive sleep apnea)   . Polysubstance abuse (HCC)   . Transient cerebral ischemia    Unknown    Patient Active Problem List   Diagnosis Date Noted  . Cellulitis of left lower extremity   . Hand abscess   . IVDU (intravenous drug user)   . Hepatitis C antibody positive in blood   . Cellulitis in diabetic foot (HCC) 01/10/2017  . Diabetic foot ulcer (HCC) 01/10/2017  . Hypotension 11/11/2016  . Suicide attempt (HCC) 10/17/2016  . Acute and chronic respiratory failure with hypercapnia (HCC)   . Pulmonary edema   . Syncope   . Respiratory failure (HCC) 01/04/2016  . Drug overdose   . Acute encephalopathy 01/01/2016  . Hyponatremia 01/01/2016  . Chronic pain syndrome 10/22/2015  . Healthcare maintenance  10/22/2015  . Hoarse voice quality 10/11/2015  . MDD (major depressive disorder), recurrent episode, moderate (HCC) 09/04/2015  . Polysubstance dependence including opioid type drug, episodic abuse (HCC) 09/04/2015  . RUQ abdominal pain   . Cholelithiasis 05/17/2015  . Antisocial personality disorder (HCC) 04/11/2015  . Opioid use disorder, severe, dependence (HCC) 04/11/2015  . Benzodiazepine dependence (HCC) 04/11/2015  . Tobacco use disorder 04/10/2015  . Narcotic dependency, continuous (HCC) 03/14/2014  . Chronic back pain greater than 3 months duration 01/28/2014  . Gastroesophageal reflux disease with esophagitis 01/28/2014  . Essential hypertension, benign 01/28/2014  . Diabetes mellitus type II, controlled (HCC) 01/28/2014  . Morbid obesity (HCC) 01/28/2014  . TIA (transient ischemic attack) 09/02/2013  . HLD (hyperlipidemia) 08/06/2013  . OSA on CPAP 08/06/2013    Past Surgical History:  Procedure Laterality Date  . CARPAL TUNNEL RELEASE    . ESOPHAGOGASTRODUODENOSCOPY N/A 04/05/2015   Procedure: ESOPHAGOGASTRODUODENOSCOPY (EGD);  Surgeon: Malissa Hippo, MD;  [facility];  Service: Endoscopy;  Laterality: N/A;  . ESOPHAGOGASTRODUODENOSCOPY (EGD) WITH PROPOFOL N/A 05/21/2015   Procedure: ESOPHAGOGASTRODUODENOSCOPY (EGD) WITH PROPOFOL;  Surgeon: Meryl Dare, MD;  Location: [facility] ENDOSCOPY;  Service: Endoscopy;  Laterality: N/A;  . NOSE SURGERY    . TOOTH EXTRACTION    . WISDOM TOOTH EXTRACTION         Home Medications  Prior to Admission medications   Medication Sig Start Date End Date Taking? Authorizing Provider  albuterol (PROVENTIL HFA;VENTOLIN HFA) 108 (90 Base) MCG/ACT inhaler Inhale 1-2 puffs into the lungs every 6 (six) hours as needed for wheezing or shortness of breath. 11/20/15  Yes Neva Seat, Tiffany, PA-C  alprazolam Prudy Feeler) 2 MG tablet Take 2 mg by mouth 3 (three) times daily.   Yes [provider]  amphetamine-dextroamphetamine  (ADDERALL) 30 MG tablet Take 30-60 mg by mouth 2 (two) times daily. Pt takes two tablets in the morning and one in the evening.   Yes [provider]  atorvastatin (LIPITOR) 20 MG tablet Take 20 mg by mouth at bedtime.   Yes [provider]  cloNIDine (CATAPRES) 0.2 MG tablet Take 0.2 mg by mouth 2 (two) times daily.   Yes [provider]  COMBIVENT RESPIMAT 20-100 MCG/ACT AERS respimat Take 1 puff by mouth every 6 (six) hours as needed for wheezing or shortness of breath.    Yes [provider]  DULoxetine (CYMBALTA) 30 MG capsule Take 3 capsules (90 mg total) daily by mouth. 01/14/17  Yes Short, Thea Silversmith, MD  Fluticasone-Salmeterol (ADVAIR) 250-50 MCG/DOSE AEPB Inhale 1 puff into the lungs 2 (two) times daily.   Yes [provider]  furosemide (LASIX) 40 MG tablet Take 40 mg by mouth daily.   Yes [provider]  gabapentin (NEURONTIN) 400 MG capsule Take 800 mg by mouth 3 (three) times daily.   Yes [provider]  metFORMIN (GLUCOPHAGE) 1000 MG tablet Take 1 tablet (1,000 mg total) by mouth 2 (two) times daily. 10/15/15  Yes Storm Frisk, MD  metoprolol tartrate (LOPRESSOR) 50 MG tablet Take 50 mg by mouth 2 (two) times daily.   Yes [provider]  omeprazole (PRILOSEC) 40 MG capsule Take 1 capsule (40 mg total) by mouth 2 (two) times daily. 10/15/15  Yes Storm Frisk, MD  oxycodone (ROXICODONE) 30 MG immediate release tablet Take 30 mg by mouth 5 (five) times daily as needed for pain.   Yes [provider]  polyethylene glycol powder (MIRALAX) powder Take 17 g daily by mouth. 01/14/17  Yes Short, Thea Silversmith, MD  QUEtiapine (SEROQUEL) 100 MG tablet Take 1-3 tablets (100-300 mg total) 3 (three) times daily by mouth. Take 1 tablet at breakfast and 1 and a half tabs with lunch and dinner 01/14/17  Yes Short, Thea Silversmith, MD  ranitidine (ZANTAC) 150 MG tablet Take 150 mg by mouth 2 (two) times daily.    Yes  [provider]  senna (SENOKOT) 8.6 MG TABS tablet Take 1 tablet (8.6 mg total) 2 (two) times daily by mouth. 01/14/17  Yes Short, Thea Silversmith, MD  sitaGLIPtin (JANUVIA) 100 MG tablet Take 100 mg by mouth daily.   Yes [provider]  testosterone cypionate (DEPOTESTOSTERONE CYPIONATE) 200 MG/ML injection Inject 200 mg into the muscle every 30 (thirty) days.    Yes [provider]  traZODone (DESYREL) 150 MG tablet Take 1 tablet (150 mg total) at bedtime by mouth. 01/14/17  Yes Short, Thea Silversmith, MD  ursodiol (ACTIGALL) 300 MG capsule Take 300 mg by mouth 2 (two) times daily.   Yes [provider]    Family History Family History  Problem Relation Age of Onset  . CAD Mother        Living  . Diabetes Mellitus II Mother   . Stroke Mother   . Hypertension Mother   . Congestive Heart Failure Mother   . Kidney disease  Mother   . Fibromyalgia Mother   . Thyroid disease Mother   . Hyperlipidemia Mother   . Liver disease Mother   . Alcoholism Father [id number]       Deceased  . Arthritis Maternal Grandmother   . Congestive Heart Failure Maternal Grandmother   . Hypertension Maternal Grandmother   . Lung cancer Maternal Grandfather   . Colon cancer Maternal Aunt   . Stomach cancer Maternal Aunt   . Heart disease Other        Paternal & Maternal  . Crohn's disease Other   . Hypertension Other        Paternal & Maternal  . Hypertension Brother        x3  . Hypertension Sister        #1  . Bipolar disorder Sister        #1  . ADD / ADHD Son        x3  . Bipolar disorder Son        x3  . Asperger's syndrome Son     Social History Social History   Tobacco Use  . Smoking status: Current Every Day Smoker    Packs/day: 0.50    Years: 23.00    Pack years: 11.50    Types: Cigarettes  . Smokeless tobacco: Never Used  Substance Use Topics  . Alcohol use: No    Alcohol/week: 0.0 oz  . Drug use: No     Allergies   Ace inhibitors; Atenolol;  Lisinopril; Prednisone; Ibuprofen; and Tylenol [acetaminophen]   Review of Systems Review of Systems  Constitutional: Negative for chills and fever.  Respiratory: Negative for shortness of breath.   Cardiovascular: Negative for chest pain.  Gastrointestinal: Negative for nausea and vomiting.  Musculoskeletal: Positive for back pain.       Chronic back pain  Skin: Negative for color change, pallor and rash.  Neurological: Negative for dizziness, syncope, facial asymmetry, speech difficulty, weakness, light-headedness, numbness and headaches.  Psychiatric/Behavioral: Positive for suicidal ideas. Negative for agitation.     Physical Exam Updated Vital Signs BP 135/70 (BP Location: Left Arm)   Pulse 83   Temp 98.1 F (36.7 C) (Oral)   Resp 18   SpO2 98%   Physical Exam  Constitutional: He is oriented to person, place, and time. He appears well-developed and well-nourished. No distress.  HENT:  Head: Normocephalic and atraumatic.  Eyes: Conjunctivae and EOM are normal. Pupils are equal, round, and reactive to light. Right eye exhibits no discharge. Left eye exhibits no discharge.  Neck: Normal range of motion. Neck supple.  Cardiovascular: Normal rate, regular rhythm and normal heart sounds.  No murmur heard. Pulmonary/Chest: Effort normal and breath sounds normal. No stridor. No respiratory distress. He has no wheezes. He has no rales.  Abdominal: Soft. He exhibits no distension. There is no tenderness.  Musculoskeletal: Normal range of motion. He exhibits no edema or deformity.  Neurological: He is alert and oriented to person, place, and time. No cranial nerve deficit. He exhibits normal muscle tone.  5/5 strength in upper extremities bilaterally. Normal stance and gait  Skin: Skin is warm and dry. No rash noted. He is not diaphoretic. No erythema. No pallor.  Psychiatric: He has a normal mood and affect.  Nursing note and vitals reviewed.    ED Treatments / Results   Labs (all labs ordered are listed, but only abnormal results are displayed) Labs Reviewed  COMPREHENSIVE METABOLIC PANEL - Abnormal; Notable for the following  components:      Result Value   Chloride 99 (*)    Glucose, Bld 119 (*)    Calcium 8.7 (*)    All other components within normal limits  ACETAMINOPHEN LEVEL - Abnormal; Notable for the following components:   Acetaminophen (Tylenol), Serum <10 (*)    All other components within normal limits  ETHANOL  SALICYLATE LEVEL  CBC  RAPID URINE DRUG SCREEN, HOSP PERFORMED    EKG  EKG Interpretation None       Radiology No results found.  Procedures Procedures (including critical care time)  Medications Ordered in ED Medications - No data to display   Initial Impression / Assessment and Plan / ED Course  I have reviewed the triage vital signs and the nursing notes.  Pertinent labs & imaging results that were available during my care of the patient were reviewed by me and considered in my medical decision making (see chart for details).     Patient presenting with suicidal ideation with a plan and reported multiple unsuccessful attempts over the last few months.  Patient's labs are unremarkable. TTS consult ordered  TTS recommend inpatient placement.   Final Clinical Impressions(s) / ED Diagnoses   Final diagnoses:  Suicidal ideation    ED Discharge Orders    None       Gregary CromerMitchell, Beva Remund B, PA-C 02/08/17 2237    Jacalyn LefevreHaviland, Julie, MD 02/08/17 2243

## 2017-02-08 NOTE — ED Notes (Signed)
Phlebotomy at bedside states unable to obtain all lab work. PA notified.

## 2017-02-08 NOTE — ED Notes (Signed)
Patient difficult stick for blood draw. Main lab contacted to attempt lab draw.
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
## 2017-02-08 NOTE — ED Triage Notes (Signed)
Patient presents voluntarily reporting having thoughts of harming himself. Pt states he lost his wife 5 months ago and it having a hard time since then. [facility] Patient reports to hurt himself he would "do it quick and easy. Shoot stuff." pt calm but slightly anxious in triage

## 2017-02-09 ENCOUNTER — Other Ambulatory Visit: Payer: Self-pay

## 2017-02-09 ENCOUNTER — Inpatient Hospital Stay (HOSPITAL_COMMUNITY)
Admission: AD | Admit: 2017-02-09 | Discharge: 2017-02-10 | DRG: 885 | Disposition: A | Discharge status: Home / Self Care | Payer: Medicare Other | Source: Intra-hospital | Attending: Psychiatry | Admitting: Psychiatry

## 2017-02-09 ENCOUNTER — Encounter (HOSPITAL_COMMUNITY): Payer: Self-pay | Admitting: General Practice

## 2017-02-09 DIAGNOSIS — J45909 Unspecified asthma, uncomplicated: Secondary | ICD-10-CM | POA: Diagnosis present

## 2017-02-09 DIAGNOSIS — Z7984 Long term (current) use of oral hypoglycemic drugs: Secondary | ICD-10-CM

## 2017-02-09 DIAGNOSIS — F1310 Sedative, hypnotic or anxiolytic abuse, uncomplicated: Secondary | ICD-10-CM | POA: Diagnosis present

## 2017-02-09 DIAGNOSIS — Z915 Personal history of self-harm: Secondary | ICD-10-CM

## 2017-02-09 DIAGNOSIS — G4733 Obstructive sleep apnea (adult) (pediatric): Secondary | ICD-10-CM | POA: Diagnosis present

## 2017-02-09 DIAGNOSIS — Z590 Homelessness: Secondary | ICD-10-CM

## 2017-02-09 DIAGNOSIS — Z818 Family history of other mental and behavioral disorders: Secondary | ICD-10-CM

## 2017-02-09 DIAGNOSIS — I10 Essential (primary) hypertension: Secondary | ICD-10-CM | POA: Diagnosis present

## 2017-02-09 DIAGNOSIS — Z888 Allergy status to other drugs, medicaments and biological substances status: Secondary | ICD-10-CM

## 2017-02-09 DIAGNOSIS — E119 Type 2 diabetes mellitus without complications: Secondary | ICD-10-CM | POA: Diagnosis present

## 2017-02-09 DIAGNOSIS — F331 Major depressive disorder, recurrent, moderate: Secondary | ICD-10-CM | POA: Diagnosis not present

## 2017-02-09 DIAGNOSIS — R45851 Suicidal ideations: Secondary | ICD-10-CM

## 2017-02-09 DIAGNOSIS — F17210 Nicotine dependence, cigarettes, uncomplicated: Secondary | ICD-10-CM | POA: Diagnosis present

## 2017-02-09 DIAGNOSIS — E785 Hyperlipidemia, unspecified: Secondary | ICD-10-CM | POA: Diagnosis present

## 2017-02-09 DIAGNOSIS — M549 Dorsalgia, unspecified: Secondary | ICD-10-CM | POA: Diagnosis present

## 2017-02-09 DIAGNOSIS — F419 Anxiety disorder, unspecified: Secondary | ICD-10-CM | POA: Diagnosis present

## 2017-02-09 DIAGNOSIS — G4700 Insomnia, unspecified: Secondary | ICD-10-CM | POA: Diagnosis present

## 2017-02-09 DIAGNOSIS — R450 Nervousness: Secondary | ICD-10-CM | POA: Diagnosis not present

## 2017-02-09 DIAGNOSIS — F3162 Bipolar disorder, current episode mixed, moderate: Secondary | ICD-10-CM | POA: Diagnosis present

## 2017-02-09 DIAGNOSIS — Z6841 Body Mass Index (BMI) 40.0 and over, adult: Secondary | ICD-10-CM

## 2017-02-09 DIAGNOSIS — Z886 Allergy status to analgesic agent status: Secondary | ICD-10-CM | POA: Diagnosis not present

## 2017-02-09 DIAGNOSIS — Z79899 Other long term (current) drug therapy: Secondary | ICD-10-CM

## 2017-02-09 DIAGNOSIS — Z811 Family history of alcohol abuse and dependence: Secondary | ICD-10-CM | POA: Diagnosis not present

## 2017-02-09 DIAGNOSIS — F1190 Opioid use, unspecified, uncomplicated: Secondary | ICD-10-CM | POA: Diagnosis present

## 2017-02-09 DIAGNOSIS — G8929 Other chronic pain: Secondary | ICD-10-CM | POA: Diagnosis present

## 2017-02-09 DIAGNOSIS — F909 Attention-deficit hyperactivity disorder, unspecified type: Secondary | ICD-10-CM | POA: Diagnosis present

## 2017-02-09 DIAGNOSIS — Z8673 Personal history of transient ischemic attack (TIA), and cerebral infarction without residual deficits: Secondary | ICD-10-CM | POA: Diagnosis not present

## 2017-02-09 DIAGNOSIS — F332 Major depressive disorder, recurrent severe without psychotic features: Secondary | ICD-10-CM | POA: Insufficient documentation

## 2017-02-09 MED ORDER — TRAZODONE HCL 50 MG PO TABS: 50.0000 mg | ORAL_TABLET | Freq: Every evening | ORAL | Status: DC | PRN

## 2017-02-09 MED ORDER — FUROSEMIDE 40 MG PO TABS
40.0000 mg | ORAL_TABLET | Freq: Every day | ORAL | Status: DC
  Administered 2017-02-10: 40 mg via ORAL
  Filled 2017-02-09 (×4): qty 1

## 2017-02-09 MED ORDER — METFORMIN HCL 500 MG PO TABS
1000.0000 mg | ORAL_TABLET | Freq: Two times a day (BID) | ORAL | Status: DC
  Filled 2017-02-09 (×2): qty 2

## 2017-02-09 MED ORDER — PANTOPRAZOLE SODIUM 40 MG PO TBEC
40.0000 mg | DELAYED_RELEASE_TABLET | Freq: Every day | ORAL | Status: DC
  Administered 2017-02-09 – 2017-02-10 (×2): 40 mg via ORAL
  Filled 2017-02-09 (×4): qty 1

## 2017-02-09 MED ORDER — CLONIDINE HCL 0.1 MG PO TABS: 0.1000 mg | ORAL_TABLET | Freq: Every day | ORAL | Status: DC

## 2017-02-09 MED ORDER — CLONIDINE HCL 0.1 MG PO TABS
0.1000 mg | ORAL_TABLET | Freq: Four times a day (QID) | ORAL | Status: DC
  Administered 2017-02-09 – 2017-02-10 (×3): 0.1 mg via ORAL
  Filled 2017-02-09 (×13): qty 1

## 2017-02-09 MED ORDER — TRAZODONE HCL 150 MG PO TABS
150.0000 mg | ORAL_TABLET | Freq: Every evening | ORAL | Status: DC | PRN
Start: 2017-02-09 — End: 2017-02-10
  Administered 2017-02-09: 150 mg via ORAL
  Filled 2017-02-09: qty 1

## 2017-02-09 MED ORDER — POLYETHYLENE GLYCOL 3350 17 GM/SCOOP PO POWD
17.0000 g | Freq: Every day | ORAL | Status: DC
  Filled 2017-02-09: qty 255

## 2017-02-09 MED ORDER — THIAMINE HCL 100 MG/ML IJ SOLN: 100.0000 mg | Freq: Once | INTRAMUSCULAR | Status: DC

## 2017-02-09 MED ORDER — CHLORDIAZEPOXIDE HCL 25 MG PO CAPS: 25.0000 mg | ORAL_CAPSULE | Freq: Three times a day (TID) | ORAL | Status: DC

## 2017-02-09 MED ORDER — IPRATROPIUM-ALBUTEROL 20-100 MCG/ACT IN AERS: 1.0000 | INHALATION_SPRAY | Freq: Four times a day (QID) | RESPIRATORY_TRACT | Status: DC | PRN

## 2017-02-09 MED ORDER — DICYCLOMINE HCL 20 MG PO TABS: 20.0000 mg | ORAL_TABLET | Freq: Four times a day (QID) | ORAL | Status: DC | PRN

## 2017-02-09 MED ORDER — GABAPENTIN 400 MG PO CAPS
800.0000 mg | ORAL_CAPSULE | Freq: Three times a day (TID) | ORAL | Status: DC
  Administered 2017-02-09 – 2017-02-10 (×3): 800 mg via ORAL
  Filled 2017-02-09 (×9): qty 2

## 2017-02-09 MED ORDER — ADULT MULTIVITAMIN W/MINERALS CH
1.0000 | ORAL_TABLET | Freq: Every day | ORAL | Status: DC
  Administered 2017-02-09 – 2017-02-10 (×2): 1 via ORAL
  Filled 2017-02-09 (×4): qty 1

## 2017-02-09 MED ORDER — CHLORDIAZEPOXIDE HCL 25 MG PO CAPS: 25.0000 mg | ORAL_CAPSULE | Freq: Four times a day (QID) | ORAL | Status: DC | PRN

## 2017-02-09 MED ORDER — POLYETHYLENE GLYCOL 3350 17 G PO PACK
17.0000 g | PACK | Freq: Every day | ORAL | Status: DC
  Filled 2017-02-09 (×4): qty 1

## 2017-02-09 MED ORDER — DULOXETINE HCL 30 MG PO CPEP
90.0000 mg | ORAL_CAPSULE | Freq: Every day | ORAL | Status: DC
  Administered 2017-02-10: 90 mg via ORAL
  Filled 2017-02-09 (×4): qty 3

## 2017-02-09 MED ORDER — ATORVASTATIN CALCIUM 20 MG PO TABS
20.0000 mg | ORAL_TABLET | Freq: Every day | ORAL | Status: DC
  Filled 2017-02-09: qty 1

## 2017-02-09 MED ORDER — DIPHENHYDRAMINE HCL 50 MG/ML IJ SOLN
50.0000 mg | Freq: Once | INTRAMUSCULAR | Status: AC
  Administered 2017-02-09: 50 mg via INTRAMUSCULAR
  Filled 2017-02-09: qty 1

## 2017-02-09 MED ORDER — ALBUTEROL SULFATE HFA 108 (90 BASE) MCG/ACT IN AERS: 1.0000 | INHALATION_SPRAY | Freq: Four times a day (QID) | RESPIRATORY_TRACT | Status: DC | PRN

## 2017-02-09 MED ORDER — ALUM & MAG HYDROXIDE-SIMETH 200-200-20 MG/5ML PO SUSP: 30.0000 mL | ORAL | Status: DC | PRN

## 2017-02-09 MED ORDER — QUETIAPINE FUMARATE 100 MG PO TABS: 100.0000 mg | ORAL_TABLET | Freq: Three times a day (TID) | ORAL | Status: DC

## 2017-02-09 MED ORDER — CHLORDIAZEPOXIDE HCL 25 MG PO CAPS: 25.0000 mg | ORAL_CAPSULE | ORAL | Status: DC

## 2017-02-09 MED ORDER — VITAMIN B-1 100 MG PO TABS
100.0000 mg | ORAL_TABLET | Freq: Every day | ORAL | Status: DC
  Administered 2017-02-10: 100 mg via ORAL
  Filled 2017-02-09 (×3): qty 1

## 2017-02-09 MED ORDER — HYDROXYZINE HCL 25 MG PO TABS: 25.0000 mg | ORAL_TABLET | Freq: Four times a day (QID) | ORAL | Status: DC | PRN

## 2017-02-09 MED ORDER — DIPHENHYDRAMINE HCL 50 MG/ML IJ SOLN
INTRAMUSCULAR | Status: AC
  Administered 2017-02-09: 50 mg via INTRAMUSCULAR
  Filled 2017-02-09: qty 1

## 2017-02-09 MED ORDER — TRAZODONE HCL 150 MG PO TABS
150.0000 mg | ORAL_TABLET | Freq: Every day | ORAL | Status: DC
  Filled 2017-02-09: qty 1

## 2017-02-09 MED ORDER — LINAGLIPTIN 5 MG PO TABS
5.0000 mg | ORAL_TABLET | Freq: Every day | ORAL | Status: DC
  Filled 2017-02-09 (×2): qty 1

## 2017-02-09 MED ORDER — HALOPERIDOL LACTATE 5 MG/ML IJ SOLN
INTRAMUSCULAR | Status: AC
  Administered 2017-02-09: 5 mg via INTRAMUSCULAR
  Filled 2017-02-09: qty 1

## 2017-02-09 MED ORDER — METHOCARBAMOL 500 MG PO TABS
500.0000 mg | ORAL_TABLET | Freq: Three times a day (TID) | ORAL | Status: DC | PRN
  Administered 2017-02-10: 500 mg via ORAL
  Filled 2017-02-09: qty 1

## 2017-02-09 MED ORDER — LORAZEPAM 2 MG/ML IJ SOLN
2.0000 mg | Freq: Once | INTRAMUSCULAR | Status: AC
  Administered 2017-02-09: 2 mg via INTRAMUSCULAR

## 2017-02-09 MED ORDER — QUETIAPINE FUMARATE 100 MG PO TABS
100.0000 mg | ORAL_TABLET | Freq: Every day | ORAL | Status: DC
  Administered 2017-02-10: 100 mg via ORAL
  Filled 2017-02-09 (×3): qty 1

## 2017-02-09 MED ORDER — MAGNESIUM HYDROXIDE 400 MG/5ML PO SUSP: 30.0000 mL | Freq: Every day | ORAL | Status: DC | PRN

## 2017-02-09 MED ORDER — QUETIAPINE FUMARATE 300 MG PO TABS
300.0000 mg | ORAL_TABLET | Freq: Every day | ORAL | Status: DC
  Filled 2017-02-09: qty 1

## 2017-02-09 MED ORDER — CHLORDIAZEPOXIDE HCL 25 MG PO CAPS
25.0000 mg | ORAL_CAPSULE | Freq: Four times a day (QID) | ORAL | Status: DC
  Administered 2017-02-09 (×2): 25 mg via ORAL
  Filled 2017-02-09 (×3): qty 1

## 2017-02-09 MED ORDER — URSODIOL 300 MG PO CAPS
300.0000 mg | ORAL_CAPSULE | Freq: Two times a day (BID) | ORAL | Status: DC
  Administered 2017-02-09 – 2017-02-10 (×2): 300 mg via ORAL
  Filled 2017-02-09 (×6): qty 1

## 2017-02-09 MED ORDER — CLONIDINE HCL 0.1 MG PO TABS
0.1000 mg | ORAL_TABLET | ORAL | Status: DC
  Filled 2017-02-09: qty 1

## 2017-02-09 MED ORDER — ATORVASTATIN CALCIUM 20 MG PO TABS
20.0000 mg | ORAL_TABLET | Freq: Every day | ORAL | Status: DC
  Filled 2017-02-09 (×3): qty 1

## 2017-02-09 MED ORDER — QUETIAPINE FUMARATE 400 MG PO TABS
400.0000 mg | ORAL_TABLET | Freq: Every day | ORAL | Status: DC
  Administered 2017-02-09: 400 mg via ORAL
  Filled 2017-02-09: qty 1
  Filled 2017-02-09: qty 2
  Filled 2017-02-09 (×2): qty 1

## 2017-02-09 MED ORDER — ONDANSETRON 4 MG PO TBDP: 4.0000 mg | ORAL_TABLET | Freq: Four times a day (QID) | ORAL | Status: DC | PRN

## 2017-02-09 MED ORDER — LORAZEPAM 2 MG/ML IJ SOLN
INTRAMUSCULAR | Status: AC
  Administered 2017-02-09: 2 mg via INTRAMUSCULAR
  Filled 2017-02-09: qty 1

## 2017-02-09 MED ORDER — LOPERAMIDE HCL 2 MG PO CAPS: 2.0000 mg | ORAL_CAPSULE | ORAL | Status: DC | PRN

## 2017-02-09 MED ORDER — HALOPERIDOL LACTATE 5 MG/ML IJ SOLN
5.0000 mg | Freq: Once | INTRAMUSCULAR | Status: AC
  Administered 2017-02-09: 5 mg via INTRAMUSCULAR
  Filled 2017-02-09: qty 1

## 2017-02-09 MED ORDER — CHLORDIAZEPOXIDE HCL 25 MG PO CAPS: 25.0000 mg | ORAL_CAPSULE | Freq: Every day | ORAL | Status: DC

## 2017-02-09 NOTE — Tx Team (Addendum)
Initial Treatment Plan 02/09/2017 4:28 PM Hennie DuosMitchell L Alatorre AOZ:[id number]    PATIENT STRESSORS: Financial difficulties Legal issue Medication change or noncompliance   PATIENT STRENGTHS: General fund of knowledge Supportive family/friends   PATIENT IDENTIFIED PROBLEMS: "My wife died"  Depression, decreased appetite, SI     Goal 1- Medication Management  Goal 2- Work on my grief              DISCHARGE CRITERIA:  Improved stabilization in mood, thinking, and/or behavior Verbal commitment to aftercare and medication compliance  PRELIMINARY DISCHARGE PLAN: Outpatient therapy Return to previous living arrangement  PATIENT/FAMILY INVOLVEMENT: This treatment plan has been presented to and reviewed with the patient, Hennie DuosMitchell L Plazola.  The patient has been given the opportunity to ask questions and make suggestions.  Larina Earthlyopson, Gidget Quizhpi E, RN 02/09/2017, 4:28 PM

## 2017-02-09 NOTE — Progress Notes (Addendum)
1:1 Nursing Progress Note  D: Patient is observed resting in bed. Patient is awake at this time. Respirations are even & unlabored. Environment is secured. Sitter observed with patient.  A: Patient remains on 1:1 observation per provider orders. High fall risk precautions in place. Patient safety monitored with q15 minute safety checks. Patient denies concerns at this time.  R: Patient remains safe on the unit at this time. Will continue to monitor with 1:1 sitter, q15 min safety checks & q4 hour nursing assessments.

## 2017-02-09 NOTE — ED Notes (Signed)
Called [facility]

## 2017-02-09 NOTE — Progress Notes (Signed)
Patient ID: [name], male   DOB: 08/01/1975, 41 y.o.   MRN: [id number]  Patient is boisterous and intrusive at this time. He reports that he would like to sign a 72 hour request for discharge and did so at 1655. He is sarcastic and argumentative with staff. He states, "I want to move halls." Writer informed patient that he would stay on 300 hall. Patient is seen walking to 400 hall several times and has to be verbally redirected by several staff. Patient is seen at nurses station and heard yelling for this Clinical research associatewriter. He asks several times more if he can move. When writer stated that patient is to not go onto 400 hall he states, "go on back to the back and mind your business. Play on your little computer."

## 2017-02-09 NOTE — Progress Notes (Signed)
1:1 Initiaion Nursing Progress Note   D: Patient is in the quiet room with open door. Patient continues to bang his head and appears agitated. Patient states, "I don't like all these people looking at me". Writer and MHT providing therapeutic communication. AC, charge nurse and [employer] on standby. Provider notified and ordered 1:1 observation for safety.  A: Patient 1:1 observation initiated per provider orders. High fall risk precautions in place. Patient safety monitored with q15 minute safety checks.  R: Patient remains safe on the unit at this time. Will continue to monitor with 1:1 sitter, q15 min safety checks & q4 hour nursing assessments.

## 2017-02-09 NOTE — Progress Notes (Signed)
Patient ID: Hennie DuosMitchell L Hupp, male   DOB: 09/24/1975, 41 y.o.   MRN: [id number] PER STATE REGULATIONS 482.30  THIS CHART WAS REVIEWED FOR MEDICAL NECESSITY WITH RESPECT TO THE PATIENT'S ADMISSION/DURATION OF STAY.  NEXT REVIEW DATE: 02/13/17  Loura HaltBARBARA Deangela Randleman, RN, BSN CASE MANAGER

## 2017-02-09 NOTE — ED Provider Notes (Signed)
Inpatient treatment recommended.   Roxy HorsemanBrowning, Chamika Cunanan, PA-C 02/09/17 [id number]    Jacalyn LefevreHaviland, Julie, MD 02/09/17 [phone]

## 2017-02-09 NOTE — ED Notes (Signed)
Gave report to Crystal

## 2017-02-09 NOTE — [facility] Suicide Risk Assessment (Signed)
[facility]   Nursing information obtained from:  Patient, Review of record Demographic factors:  Male, Divorced or widowed, Caucasian, Unemployed, Access to firearms, Low socioeconomic status Current Mental Status:  NA Loss Factors:  Loss of significant relationship, Legal issues, Financial problems / change in socioeconomic status Historical Factors:  Family history of mental illness or substance abuse Risk Reduction Factors:  Sense of responsibility to family, Living with another person, especially a relative  Total Time spent with patient: 45 minutes Principal Problem: MDD (major depressive disorder), recurrent severe, without psychosis (HCC) Diagnosis:   Patient Active Problem List   Diagnosis Date Noted  . MDD (major depressive disorder), recurrent severe, without psychosis (HCC) [F33.2] 02/09/2017  . Cellulitis of left lower extremity [L03.116]   . Hand abscess [L02.519]   . IVDU (intravenous drug user) [F19.90]   . Hepatitis C antibody positive in blood [R76.8]   . Cellulitis in diabetic foot (HCC) [W09.811[E11.628, L03.119] 01/10/2017  . Diabetic foot ulcer (HCC) [B14.782[E11.621, L97.509] 01/10/2017  . Hypotension [I95.9] 11/11/2016  . Suicide attempt (HCC) [T14.91XA] 10/17/2016  . Acute and chronic respiratory failure with hypercapnia (HCC) [J96.22]   . Pulmonary edema [J81.1]   . Syncope [R55]   . Respiratory failure (HCC) [J96.90] 01/04/2016  . Drug overdose [T50.901A]   . Acute encephalopathy [G93.40] 01/01/2016  . Hyponatremia [E87.1] 01/01/2016  . Chronic pain syndrome [G89.4] 10/22/2015  . Healthcare maintenance [Z00.00] 10/22/2015  . Hoarse voice quality [R49.0] 10/11/2015  . MDD (major depressive disorder), recurrent episode, moderate (HCC) [F33.1] 09/04/2015  . Polysubstance dependence including opioid type drug, episodic abuse (HCC) [F11.20, F19.20] 09/04/2015  . RUQ abdominal pain [R10.11]   . Cholelithiasis [K80.20] 05/17/2015  . Antisocial  personality disorder (HCC) [F60.2] 04/11/2015  . Opioid use disorder, severe, dependence (HCC) [F11.20] 04/11/2015  . Benzodiazepine dependence (HCC) [F13.20] 04/11/2015  . Tobacco use disorder [F17.200] 04/10/2015  . Narcotic dependency, continuous (HCC) [F11.20] 03/14/2014  . Chronic back pain greater than 3 months duration [M54.9, G89.29] 01/28/2014  . Gastroesophageal reflux disease with esophagitis [K21.0] 01/28/2014  . Essential hypertension, benign [I10] 01/28/2014  . Diabetes mellitus type II, controlled (HCC) [E11.9] 01/28/2014  . Morbid obesity (HCC) [E66.01] 01/28/2014  . TIA (transient ischemic attack) [G45.9] 09/02/2013  . HLD (hyperlipidemia) [E78.5] 08/06/2013  . OSA on CPAP [G47.33, Z99.89] 08/06/2013   Subjective Data:  Bryan Gallegos is a 41 y/o M with history of Bipolar 1 and multiple substance use disorders who presented voluntarily to [facility] with worsening symptoms of depression and SI with plan to overdose on his wife's medications. Pt has stressor of death of his wife about 4 months ago and since that time he has multiple inpatient hospitalizations for similar presentations.  Today  Upon evaluation, pt reports reasons for presenting, stating, "It's the holidays and my wife's been dead for 4 months, so it's been really rough." He endorses SI at time of evaluation, but he denies having a plan, and he also states that he "could never kill myself" due to religious reasons, and he also added, "also, I couldn't do it because then I know I wouldn't be with her." He denies HI/AH/VH. He reports increased depression for the past 1 month, but he has also been having depression since her death. He reports insomnia, low motivation, hopelessness, guilty feelings, low energy, and poor motivation. He denies symptoms of mania, OCD, and PTSD. He reports using xanax 2mg  three times per day and oxycodone 30mg  five times per day, both prescribed  by his PCP, but his UDS was negative on initial  presentation.  Discussed with patient about treatment plan. He perseverates on receiving addderrall, xanax, and opiates. Pt was informed that he would not be restarted on opiates as he has not been regularly prescribed them and he has also been using illicit substances (cocaine on UDS in 10/2016), and pt immediately replied by requesting to be discharged. Pt will also be started on benzodiazepine withdrawal protocol due to his xanax use. He agreed to be resumed on cymbalta as he is unsure if he has been taking this medication outside the hospital, and he agreed to restart seroquel at higher bedtime dose. Pt had no further questions, comments, or concerns.   Continued Clinical Symptoms:  Alcohol Use Disorder Identification Test Final Score (AUDIT): 0 The "Alcohol Use Disorders Identification Test", Guidelines for Use in Primary Care, Second Edition.  World Science writer [facility]). Score between 0-7:  no or low risk or alcohol related problems. Score between 8-15:  moderate risk of alcohol related problems. Score between 16-19:  high risk of alcohol related problems. Score 20 or above:  warrants further diagnostic evaluation for alcohol dependence and treatment.   CLINICAL FACTORS:   Severe Anxiety and/or Agitation Bipolar Disorder:   Depressive phase Chronic Pain Unstable or Poor Therapeutic Relationship Previous Psychiatric Diagnoses and Treatments   Musculoskeletal: Strength & Muscle Tone: within normal limits Gait & Station: normal Patient leans: N/A  Psychiatric Specialty Exam: Physical Exam  Nursing note and vitals reviewed.   Review of Systems  Constitutional: Negative for fever.  Respiratory: Negative for cough.   Gastrointestinal: Negative for heartburn.  Musculoskeletal: Positive for back pain.  Psychiatric/Behavioral: Positive for depression, substance abuse and suicidal ideas. Negative for hallucinations. The patient is nervous/anxious.     Blood pressure 118/65,  pulse (!) 109, temperature 97.6 F (36.4 C), temperature source Oral, resp. rate 20, height 6\' 2"  (1.88 m), weight (!) 162 kg (357 lb 4 oz).Body mass index is 45.87 kg/m.  General Appearance: Bizarre and Fairly Groomed  Eye Contact:  Good  Speech:  Clear and Coherent and Normal Rate  Volume:  Normal  Mood:  Anxious and Depressed  Affect:  Appropriate, Congruent and Constricted  Thought Process:  Coherent and Goal Directed  Orientation:  Full (Time, Place, and Person)  Thought Content:  Logical  Suicidal Thoughts:  Yes.  without intent/plan  Homicidal Thoughts:  No  Memory:  Immediate;   Good Recent;   Good Remote;   Good  Judgement:  Impaired  Insight:  Lacking  Psychomotor Activity:  Normal  Concentration:  Concentration: Fair  Recall:  [employer] of Knowledge:  Fair  Language:  Fair  Akathisia:  No  Handed:    AIMS (if indicated):     Assets:  Manufacturing systems engineer Physical Health Resilience Social Support  ADL's:  Intact  Cognition:  WNL  Sleep:         COGNITIVE FEATURES THAT CONTRIBUTE TO RISK:  None    SUICIDE RISK:   Minimal: No identifiable suicidal ideation.  Patients presenting with no risk factors but with morbid ruminations; may be classified as minimal risk based on the severity of the depressive symptoms  PLAN OF CARE:  - Admit to inpatient psychiatry unit  -Bipolar I  - Change seroquel 100mg  qAM + 300mg  qhs to seroquel 100mg  qAM + 400mg  qhs  - Continue cymbalta 90mg  qDay  - Continue gabapentin 800mg  TID - Opiate use disorder  - Start clonidine taper -  benzodiazepine abuse  - Start librium taper  -Encourage participation in groups and therapeutic milieu -Discharge planning will be ongoing  I certify that inpatient services furnished can reasonably be expected to improve the patient's condition.   Micheal Likenshristopher T Damire Remedios, MD 02/09/2017, 5:47 PM

## 2017-02-09 NOTE — Progress Notes (Signed)
Patient ID: Bryan Gallegos, male   DOB: 08/29/1975, 41 y.o.   MRN: [id number]  At change of shift MHT Bryan Gallegos came to Clinical research associatewriter and stated that patient was becoming agitated saying that he needed something for pain. He sat in the hallway on a chair and began banging the back of his head against the wall. The dry wall has begun to crack at the weight of him thrusting his head into it. Verbal de-escalation and offering of available medications were attempted however patient continues to bang his head against the wall. Security was called and is visible on the unit. A/C Delorise Jacksonori and Linsey were notified and [place] police department was called for safety. They are currently in route. Patient states, "I'm not going to stop my pain medication I've been taking for years. I told the doctor that." Patient's UDS is negative however patient insists that he takes opiate pain medication, Adderall, and Xanax.

## 2017-02-09 NOTE — Progress Notes (Addendum)
Patient remains in the hallway and continues to bang head. Patient continues to ruminate on pain medications. [facility]. Patient has made a crack in the drywall with his head banging. No open wounds or bleeding at this time. Provider notified and new orders received. Patient verbally agrees to take IM injections. Ativan 2 mg, benadryl 50 mg, and haldol 5 mg administered without incident. AC Lillia AbedLindsay states, "patient did not lose consciousness, he does not need to go to the ED". Patient denies pain and states "it's gonna take a lot more than this to hurt my head". Patient verbally agrees to go to the Quiet Room. Door remains open. Day shift and night shift AC, charge nurse, Clinical research associatewriter and [employer] monitoring patient.

## 2017-02-09 NOTE — BH Assessment (Signed)
[facility] Assessment Progress Note   [facility] Attending provider will be Dr. Jama Flavorsobos, MD. Call to report [phone]. Pt's nurse [name], [name], RN notified of acceptance.   [name], MSW, LCSW Therapeutic Triage Specialist  [phone]

## 2017-02-09 NOTE — BH Assessment (Signed)
BHH Assessment Progress Note  Per Malachy Chamberakia Starkes, NP, this pt requires psychiatric hospitalization at this time.  [facility]  Pt has signed Voluntary Admission and Consent for Treatment, as well as Consent to Release Information to [facility] [facility], and signed forms have [facility].  Pt's nurse has been notified, and agrees to send original paperwork along with pt via [place], and to call report to [phone].  Doylene Canninghomas Vearl Aitken, MA Triage Specialist [phone]

## 2017-02-09 NOTE — Progress Notes (Addendum)
Patient requesting to lay down in his room. Patient reports he is calm now and states "I won't hit my head I will be in bed". Patient then went to dayroom. Peers asked to leave dayroom to begin NA in group room on the hall. Writer engaged in discussion with patient. Patient states, "today is the 5 month anniversary of my wife's death. The holidays are hard. I came here for help, but I need my pain medicine until I get my surgery". Patient verbalizes understanding no opioids are ordered at this time. Patient refuses PRN robaxin or other ordered medications. Patient agrees to lay down in his bed. Patient refuses staff to check vital signs and states, "I don't want anyone to touch me". Patient provided ginger ale and snacks. Patient now resting in bed and sitter at bedside.

## 2017-02-09 NOTE — Progress Notes (Signed)
Patient ID: [name], male   DOB: 12/29/1975, 41 y.o.   MRN: [id number]  Admission Note- Bryan Gallegos is a 41 [facility] and suicidal ideation with a plan to shoot himself. 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 He currently denies SI/HI and A/V hallucinations. Pt states his wife died about 6 months ago and he has tried to kill himself 4 times since she has passed away. He is cooperative with the admission process and oriented to the unit. Q15 minute safety checks are initiated and are to be maintained.

## 2017-02-09 NOTE — Progress Notes (Signed)
Per patient request, [place] and district attorney with admission date, as patient has court date today 02/09/2017. Original and fax confirmations provided to pt for his record. He was encouraged to call and follow-up regarding his missed court date.  Trula SladeHeather Smart, MSW, LCSW Clinical Social Worker 02/09/2017 1:45 PM

## 2017-02-09 NOTE — ED Notes (Signed)
Denies AVH, HI. Admits to SI, no plan. Pt withdrawn not able to fully assess him. He appears to be sleepy. Staff will continue to monitor, meet needs, and maintain safety.

## 2017-02-09 NOTE — H&P (Signed)
Psychiatric Admission Assessment Adult  Patient Identification: Bryan Gallegos  MRN:  [id number]  Date of Evaluation:  02/09/2017  Chief Complaint:  mdd recurrent severe  Principal Diagnosis: MDD (major depressive disorder), recurrent severe, without psychosis ([place])  Diagnosis:   Patient Active Problem List   Diagnosis Date Noted  . MDD (major depressive disorder), recurrent severe, without psychosis ([place]) [F33.2] 02/09/2017    Priority: High  . Cellulitis of left lower extremity [L03.116]   . Hand abscess [L02.519]   . IVDU (intravenous drug user) [F19.90]   . Hepatitis C antibody positive in blood [R76.8]   . Cellulitis in diabetic foot ([place]) [V69.450, T88.828] 01/10/2017  . Diabetic foot ulcer ([place]) [M03.491, L97.509] 01/10/2017  . Hypotension [I95.9] 11/11/2016  . Suicide attempt ([place]) [T14.91XA] 10/17/2016  . Acute and chronic respiratory failure with hypercapnia (HCC) [J96.22]   . Pulmonary edema [J81.1]   . Syncope [R55]   . Respiratory failure ([place]) [J96.90] 01/04/2016  . Drug overdose [T50.901A]   . Acute encephalopathy [G93.40] 01/01/2016  . Hyponatremia [E87.1] 01/01/2016  . Chronic pain syndrome [G89.4] 10/22/2015  . Healthcare maintenance [Z00.00] 10/22/2015  . Hoarse voice quality [R49.0] 10/11/2015  . MDD (major depressive disorder), recurrent episode, moderate ([place]) [F33.1] 09/04/2015  . Polysubstance dependence including opioid type drug, episodic abuse ([place]) [F11.20, F19.20] 09/04/2015  . RUQ abdominal pain [R10.11]   . Cholelithiasis [K80.20] 05/17/2015  . Antisocial personality disorder ([place]) [F60.2] 04/11/2015  . Opioid use disorder, severe, dependence ([place]) [F11.20] 04/11/2015  . Benzodiazepine dependence ([place]) [F13.20] 04/11/2015  . Tobacco use disorder [F17.200] 04/10/2015  . Narcotic dependency, continuous ([place]) [F11.20] 03/14/2014  . Chronic back pain greater than 3 months duration [M54.9, G89.29] 01/28/2014  . Gastroesophageal reflux disease  with esophagitis [K21.0] 01/28/2014  . Essential hypertension, benign [I10] 01/28/2014  . Diabetes mellitus type II, controlled ([place]) [E11.9] 01/28/2014  . Morbid obesity ([place]) [E66.01] 01/28/2014  . TIA (transient ischemic attack) [G45.9] 09/02/2013  . HLD (hyperlipidemia) [E78.5] 08/06/2013  . OSA on CPAP [G47.33, Z99.89] 08/06/2013   History of Present Illness: This is an admission assessment for this 41 year old Caucasian male with hx of Major depression & other chronic medical issues. He is known in this hospital from previous hospitalizations. He is being admitted from the [facility] with complaints of worsening symptoms of depression triggering suicidal ideations with plans to overdose on medications.  During this assessment, Bryan Gallegos reports, "I went to the [facility] yesterday because I have been feeling worthless & hopeless since my wife died in Oct 07, 2022 of this year. These symptoms worsened in the last 1 month. Since my wife passed away, I have not been the same. My depression has gotten worse. I have been through quite few hospitalizations, but, have not seen a particular psychiatrist. I have been taken Trazodone, Seroquel & Cymbalta, but, they are not helping my depression. When am I getting my Adderall & pain pills? My back, legs & knees hurt. I have attempted to take my life x 4, all by overdose & other means. I need help".  Associated Signs/Symptoms:  Depression Symptoms:  depressed mood, insomnia, feelings of worthlessness/guilt, hopelessness, suicidal thoughts without plan,  (Hypo) Manic Symptoms:  Irritable Mood,  Anxiety Symptoms:  Excessive Worry,  Psychotic Symptoms:  Denies any hallucinations, delusions or paranoia.  PTSD Symptoms: NA  Total Time spent with patient: 1 hour  Past Psychiatric History: Major depressive disorder  Is the patient at risk to self? Yes.    Has  the patient been a risk to self in the past 6 months? Yes.    Has the  patient been a risk to self within the distant past? Yes.    Is the patient a risk to others? No.  Has the patient been a risk to others in the past 6 months? No.  Has the patient been a risk to others within the distant past? No.   Prior Inpatient Therapy: Yes  Prior Outpatient Therapy: Yes  Alcohol Screening: 1. How often do you have a drink containing alcohol?: Never 2. How many drinks containing alcohol do you have on a typical day when you are drinking?: 1 or 2 3. How often do you have six or more drinks on one occasion?: Never AUDIT-C Score: 0 9. Have you or someone else been injured as a result of your drinking?: No 10. Has a relative or friend or a doctor or another health worker been concerned about your drinking or suggested you cut down?: No Alcohol Use Disorder Identification Test Final Score (AUDIT): 0 Intervention/Follow-up: AUDIT Score <7 follow-up not indicated Substance Abuse History in the last 12 months:  Yes.    Consequences of Substance Abuse: Medical Consequences:  Liver damage, Possible death by overdose Legal Consequences:  Arrests, jail time, Loss of driving privilege. Family Consequences:  Family discord, divorce and or separation.  Previous Psychotropic Medications: (Seroquel, Trazodone)  Psychological Evaluations: No   Past Medical History:  Past Medical History:  Diagnosis Date  . Adult ADHD   . Anaphylactic reaction   . Anxiety   . Asthma   . Bipolar disorder ([place])   . Complication of anesthesia    Per patient difficult intubation;  . Diabetes mellitus   . Difficult intubation    Per patient  . Drug overdose   . Heart valve disorder    s/p echocardiogram  . Hyperlipidemia   . Hypertension   . Morbidly obese ([place])   . OSA (obstructive sleep apnea)   . Polysubstance abuse ([place])   . Transient cerebral ischemia    Unknown    Past Surgical History:  Procedure Laterality Date  . CARPAL TUNNEL RELEASE    . ESOPHAGOGASTRODUODENOSCOPY N/A  04/05/2015   Procedure: ESOPHAGOGASTRODUODENOSCOPY (EGD);  Surgeon: Rogene Houston, MD;  Location[facility];  Service: Endoscopy;  Laterality: N/A;  . ESOPHAGOGASTRODUODENOSCOPY (EGD) WITH PROPOFOL N/A 05/21/2015   Procedure: ESOPHAGOGASTRODUODENOSCOPY (EGD) WITH PROPOFOL;  Surgeon: Ladene Artist, MD;  Location: [facility] ENDOSCOPY;  Service: Endoscopy;  Laterality: N/A;  . NOSE SURGERY    . TOOTH EXTRACTION    . WISDOM TOOTH EXTRACTION     Family History:  Family History  Problem Relation Age of Onset  . CAD Mother        Living  . Diabetes Mellitus II Mother   . Stroke Mother   . Hypertension Mother   . Congestive Heart Failure Mother   . Kidney disease Mother   . Fibromyalgia Mother   . Thyroid disease Mother   . Hyperlipidemia Mother   . Liver disease Mother   . Alcoholism Father 68       Deceased  . Arthritis Maternal Grandmother   . Congestive Heart Failure Maternal Grandmother   . Hypertension Maternal Grandmother   . Lung cancer Maternal Grandfather   . Colon cancer Maternal Aunt   . Stomach cancer Maternal Aunt   . Heart disease Other        Paternal & Maternal  . Crohn's disease Other   .  Hypertension Other        Paternal & Maternal  . Hypertension Brother        x3  . Hypertension Sister        #1  . Bipolar disorder Sister        #1  . ADD / ADHD Son        x3  . Bipolar disorder Son        x3  . Asperger's syndrome Son    Family Psychiatric  History: "Depression & bipolar runs in my family"  Tobacco Screening: Have you used any form of tobacco in the last 30 days? (Cigarettes, Smokeless Tobacco, Cigars, and/or Pipes): Yes Tobacco use, Select all that apply: 5 or more cigarettes per day Are you interested in Tobacco Cessation Medications?: No, patient refused Counseled patient on smoking cessation including recognizing danger situations, developing coping skills and basic information about quitting provided: Refused/Declined practical  counseling  Social History:  Social History   Substance and Sexual Activity  Alcohol Use No  . Alcohol/week: 0.0 oz     Social History   Substance and Sexual Activity  Drug Use Yes  . Types: Benzodiazepines, Heroin    Additional Social History:  Allergies:   Allergies  Allergen Reactions  . Ace Inhibitors Anaphylaxis  . Atenolol Anaphylaxis  . Lisinopril Anaphylaxis  . Prednisone Hives  . Ibuprofen Itching, Swelling and Other (See Comments)    Hand/feet swelling   . Tylenol [Acetaminophen] Other (See Comments)    Reaction:  GI bleeding    Lab Results:  Results for orders placed or performed during the hospital encounter of 02/08/17 (from the past 48 hour(s))  Rapid urine drug screen (hospital performed)     Status: None   Collection Time: 02/08/17  6:32 PM  Result Value Ref Range   Opiates NONE DETECTED NONE DETECTED   Cocaine NONE DETECTED NONE DETECTED   Benzodiazepines NONE DETECTED NONE DETECTED   Amphetamines NONE DETECTED NONE DETECTED   Tetrahydrocannabinol NONE DETECTED NONE DETECTED   Barbiturates NONE DETECTED NONE DETECTED    Comment:        DRUG SCREEN FOR MEDICAL PURPOSES ONLY.  IF CONFIRMATION IS NEEDED FOR ANY PURPOSE, NOTIFY LAB WITHIN 5 DAYS.        LOWEST DETECTABLE LIMITS FOR URINE DRUG SCREEN Drug Class       Cutoff (ng/mL) Amphetamine      1000 Barbiturate      200 Benzodiazepine   086 Tricyclics       578 Opiates          300 Cocaine          300 THC              50   Comprehensive metabolic panel     Status: Abnormal   Collection Time: 02/08/17  7:10 PM  Result Value Ref Range   Sodium 135 135 - 145 mmol/L   Potassium 4.0 3.5 - 5.1 mmol/L   Chloride 99 (L) 101 - 111 mmol/L   CO2 28 22 - 32 mmol/L   Glucose, Bld 119 (H) 65 - 99 mg/dL   BUN 12 6 - 20 mg/dL   Creatinine, Ser 0.81 0.61 - 1.24 mg/dL   Calcium 8.7 (L) 8.9 - 10.3 mg/dL   Total Protein 7.9 6.5 - 8.1 g/dL   Albumin 3.8 3.5 - 5.0 g/dL   AST 26 15 - 41 U/L   ALT 23 17  - 63 U/L  Alkaline Phosphatase 58 38 - 126 U/L   Total Bilirubin 0.5 0.3 - 1.2 mg/dL   GFR calc non Af Amer >60 >60 mL/min   GFR calc Af Amer >60 >60 mL/min    Comment: (NOTE) The eGFR has been calculated using the CKD EPI equation. This calculation has not been validated in all clinical situations. eGFR's persistently <60 mL/min signify possible Chronic Kidney Disease.    Anion gap 8 5 - 15  cbc     Status: None   Collection Time: 02/08/17  7:10 PM  Result Value Ref Range   WBC 9.2 4.0 - 10.5 K/uL   RBC 4.62 4.22 - 5.81 MIL/uL   Hemoglobin 14.8 13.0 - 17.0 g/dL   HCT 43.0 39.0 - 52.0 %   MCV 93.1 78.0 - 100.0 fL   MCH 32.0 26.0 - 34.0 pg   MCHC 34.4 30.0 - 36.0 g/dL   RDW 12.9 11.5 - 15.5 %   Platelets 197 150 - 400 K/uL  Ethanol     Status: None   Collection Time: 02/08/17  8:01 PM  Result Value Ref Range   Alcohol, Ethyl (B) <10 <10 mg/dL    Comment:        LOWEST DETECTABLE LIMIT FOR SERUM ALCOHOL IS 10 mg/dL FOR MEDICAL PURPOSES ONLY   Salicylate level     Status: None   Collection Time: 02/08/17  8:01 PM  Result Value Ref Range   Salicylate Lvl <9.1 2.8 - 30.0 mg/dL  Acetaminophen level     Status: Abnormal   Collection Time: 02/08/17  8:01 PM  Result Value Ref Range   Acetaminophen (Tylenol), Serum <10 (L) 10 - 30 ug/mL    Comment:        THERAPEUTIC CONCENTRATIONS VARY SIGNIFICANTLY. A RANGE OF 10-30 ug/mL MAY BE AN EFFECTIVE CONCENTRATION FOR MANY PATIENTS. HOWEVER, SOME ARE BEST TREATED AT CONCENTRATIONS OUTSIDE THIS RANGE. ACETAMINOPHEN CONCENTRATIONS >150 ug/mL AT 4 HOURS AFTER INGESTION AND >50 ug/mL AT 12 HOURS AFTER INGESTION ARE OFTEN ASSOCIATED WITH TOXIC REACTIONS.    Blood Alcohol level:  Lab Results  Component Value Date   ETH <10 02/08/2017   ETH <5 47/82/9562    Metabolic Disorder Labs:  Lab Results  Component Value Date   HGBA1C 5.7 (H) 01/11/2017   MPG 116.89 01/11/2017   MPG 126 01/04/2016   No results found for:  PROLACTIN Lab Results  Component Value Date   CHOL 148 09/04/2013   TRIG 327 (H) 01/06/2016   HDL 19 (L) 09/04/2013   CHOLHDL 7.8 09/04/2013   VLDL 46 (H) 09/04/2013   LDLCALC 83 09/04/2013   LDLCALC 119 (H) 08/07/2013   Current Medications: Current Facility-Administered Medications  Medication Dose Route Frequency Provider Last Rate Last Dose  . albuterol (PROVENTIL HFA;VENTOLIN HFA) 108 (90 Base) MCG/ACT inhaler 1-2 puff  1-2 puff Inhalation Q6H PRN Ethelene Hal, NP      . alum & mag hydroxide-simeth (MAALOX/MYLANTA) 200-200-20 MG/5ML suspension 30 mL  30 mL Oral Q4H PRN Ethelene Hal, NP      . atorvastatin (LIPITOR) tablet 20 mg  20 mg Oral QHS Lindell Spar I, NP      . DULoxetine (CYMBALTA) DR capsule 90 mg  90 mg Oral Daily Ethelene Hal, NP      . furosemide (LASIX) tablet 40 mg  40 mg Oral Daily Nwoko, Agnes I, NP      . gabapentin (NEURONTIN) capsule 800 mg  800 mg Oral TID Jinny Blossom  Toribio Harbour, NP   800 mg at 02/09/17 1205  . hydrOXYzine (ATARAX/VISTARIL) tablet 25 mg  25 mg Oral Q6H PRN Ethelene Hal, NP      . Ipratropium-Albuterol (COMBIVENT) respimat 1 puff  1 puff Inhalation Q6H PRN Nwoko, Agnes I, NP      . magnesium hydroxide (MILK OF MAGNESIA) suspension 30 mL  30 mL Oral Daily PRN Ethelene Hal, NP      . pantoprazole (PROTONIX) EC tablet 40 mg  40 mg Oral Daily Nwoko, Agnes I, NP      . polyethylene glycol powder (GLYCOLAX/MIRALAX) container 17 g  17 g Oral Daily Nwoko, Herbert Pun I, NP      . Derrill Memo ON 02/10/2017] QUEtiapine (SEROQUEL) tablet 100 mg  100 mg Oral Daily Ethelene Hal, NP      . QUEtiapine (SEROQUEL) tablet 300 mg  300 mg Oral QHS Ethelene Hal, NP      . traZODone (DESYREL) tablet 150 mg  150 mg Oral QHS Ethelene Hal, NP      . ursodiol (ACTIGALL) capsule 300 mg  300 mg Oral BID Ethelene Hal, NP       PTA Medications: Medications Prior to Admission  Medication Sig Dispense Refill  Last Dose  . albuterol (PROVENTIL HFA;VENTOLIN HFA) 108 (90 Base) MCG/ACT inhaler Inhale 1-2 puffs into the lungs every 6 (six) hours as needed for wheezing or shortness of breath. 1 Inhaler 0 Past Month at Unknown time  . alprazolam (XANAX) 2 MG tablet Take 2 mg by mouth 3 (three) times daily.   Past Month at Unknown time  . amphetamine-dextroamphetamine (ADDERALL) 30 MG tablet Take 30-60 mg by mouth 2 (two) times daily. Pt takes two tablets in the morning and one in the evening.   Past Month at Unknown time  . atorvastatin (LIPITOR) 20 MG tablet Take 20 mg by mouth at bedtime.  1 Past Month at Unknown time  . cloNIDine (CATAPRES) 0.2 MG tablet Take 0.2 mg by mouth 2 (two) times daily.   Past Month at Unknown time  . COMBIVENT RESPIMAT 20-100 MCG/ACT AERS respimat Take 1 puff by mouth every 6 (six) hours as needed for wheezing or shortness of breath.   0 Past Month at Unknown time  . DULoxetine (CYMBALTA) 30 MG capsule Take 3 capsules (90 mg total) daily by mouth. 90 capsule 0 Past Month at Unknown time  . Fluticasone-Salmeterol (ADVAIR) 250-50 MCG/DOSE AEPB Inhale 1 puff into the lungs 2 (two) times daily.   Past Month at Unknown time  . furosemide (LASIX) 40 MG tablet Take 40 mg by mouth daily.  0 Past Month at Unknown time  . gabapentin (NEURONTIN) 400 MG capsule Take 800 mg by mouth 3 (three) times daily.   Past Month at Unknown time  . metFORMIN (GLUCOPHAGE) 1000 MG tablet Take 1 tablet (1,000 mg total) by mouth 2 (two) times daily. 60 tablet 6 Past Month at Unknown time  . metoprolol tartrate (LOPRESSOR) 50 MG tablet Take 50 mg by mouth 2 (two) times daily.  0 Past Month at Unknown time  . omeprazole (PRILOSEC) 40 MG capsule Take 1 capsule (40 mg total) by mouth 2 (two) times daily. 60 capsule 6 Past Month at Unknown time  . oxycodone (ROXICODONE) 30 MG immediate release tablet Take 30 mg by mouth 5 (five) times daily as needed for pain.   Past Month at Unknown time  . polyethylene glycol  powder (MIRALAX) powder Take 17 g daily by  mouth. 500 g 0 Past Month at Unknown time  . QUEtiapine (SEROQUEL) 100 MG tablet Take 1-3 tablets (100-300 mg total) 3 (three) times daily by mouth. Take 1 tablet at breakfast and 1 and a half tabs with lunch and dinner 120 tablet 0 Past Month at Unknown time  . ranitidine (ZANTAC) 150 MG tablet Take 150 mg by mouth 2 (two) times daily.   0 Past Month at Unknown time  . senna (SENOKOT) 8.6 MG TABS tablet Take 1 tablet (8.6 mg total) 2 (two) times daily by mouth. 120 each 0 Past Month at Unknown time  . sitaGLIPtin (JANUVIA) 100 MG tablet Take 100 mg by mouth daily.   Past Month at Unknown time  . testosterone cypionate (DEPOTESTOSTERONE CYPIONATE) 200 MG/ML injection Inject 200 mg into the muscle every 30 (thirty) days.   0 Past Month at Unknown time  . traZODone (DESYREL) 150 MG tablet Take 1 tablet (150 mg total) at bedtime by mouth. 30 tablet 0 Past Month at Unknown time  . ursodiol (ACTIGALL) 300 MG capsule Take 300 mg by mouth 2 (two) times daily.   Past Month at Unknown time   Musculoskeletal: Strength & Muscle Tone: within normal limits Gait & Station: normal Patient leans: N/A  Psychiatric Specialty Exam: Physical Exam  Constitutional: He appears well-developed.  Obese  HENT:  Head: Normocephalic.  Eyes: Pupils are equal, round, and reactive to light.  Neck: Normal range of motion.  Cardiovascular: Normal rate.  Respiratory: Effort normal.  GI: Soft.  Genitourinary:  Genitourinary Comments: Deferred  Musculoskeletal: Normal range of motion.  Neurological: He is alert.  Skin: Skin is warm.    Review of Systems  Constitutional: Positive for malaise/fatigue.  HENT: Negative.   Eyes: Negative.   Respiratory: Positive for shortness of breath (Hx of).        Hx. Sleep apnea  Cardiovascular: Negative.        Hx. HTN, DM & obesity  Gastrointestinal: Negative.   Genitourinary: Negative.   Musculoskeletal: Positive for back pain, joint  pain and myalgias.  Skin:       Discolorations to lower extremities & abdominal areas  Neurological: Positive for weakness.  Endo/Heme/Allergies: Negative.   Psychiatric/Behavioral: Positive for depression, substance abuse (Hx. Opioid, Amphetamine, Benzodiazepine) and suicidal ideas. Negative for hallucinations and memory loss. The patient is nervous/anxious and has insomnia.     Blood pressure 118/65, pulse (!) 109, temperature 97.6 F (36.4 C), temperature source Oral, resp. rate 20, height _0  (1.88 m), weight (!) 162 kg (357 lb 4 oz).Body mass index is 45.87 kg/m.  General Appearance: Fairly Groomed and Obese  Eye Contact:  Fair  Speech:  Clear and Coherent and Normal Rate  Volume:  Normal  Mood:  Depressed and Hopeless  Affect:  Depressed and Flat  Thought Process:  Coherent, Linear and Descriptions of Associations: Intact  Orientation:  Full (Time, Place, and Person)  Thought Content:  Rumination  Suicidal Thoughts:  Yes.  without intent/plan  Homicidal Thoughts:  No  Memory:  Immediate;   Good Recent;   Good Remote;   Good  Judgement:  Good  Insight:  Fair  Psychomotor Activity:  Normal  Concentration:  Concentration: Good and Attention Span: Good  Recall:  Good  Fund of Knowledge:  Fair  Language:  Good  Akathisia:  Negative  Handed:  Right  AIMS (if indicated):     Assets:  Communication Skills Desire for Improvement  ADL's:  Intact  Cognition:  WNL  Sleep:      Treatment Plan Summary: Daily contact with patient to assess and evaluate symptoms and progress in treatment: See MAR, Md's SRA & treatment plan.  Observation Level/Precautions:  15 minute checks  Laboratory:  Per ED  Psychotherapy: Group sessions    Medications: See MAR  Consultations: As needed  Discharge Concerns: Safety, Mood stability  Estimated LOS: 2-4 days  Other: Admit to the [facility]    Physician Treatment Plan for Primary Diagnosis: MDD (major depressive disorder), recurrent severe,  without psychosis ([place])  Long Term Goal(s): Improvement in symptoms so as ready for discharge  Short Term Goals: Ability to identify changes in lifestyle to reduce recurrence of condition will improve and Ability to disclose and discuss suicidal ideas  Physician Treatment Plan for Secondary Diagnosis: Principal Problem:   MDD (major depressive disorder), recurrent severe, without psychosis ([place])  Long Term Goal(s): Improvement in symptoms so as ready for discharge  Short Term Goals: Ability to identify and develop effective coping behaviors will improve, Compliance with prescribed medications will improve and Ability to identify triggers associated with substance abuse/mental health issues will improve  I certify that inpatient services furnished can reasonably be expected to improve the patient's condition.    Encarnacion Slates, NP, PMHNP, FNP-BC. 12/12/20183:02 PM   I have reviewed NP's Note, assessement, diagnosis and plan, and agree. I have also met with patient and completed suicide risk assessment.  Bryan Gallegos is a 41 y/o M with history of Bipolar 1 and multiple substance use disorders who presented voluntarily to [facility] with worsening symptoms of depression and SI with plan to overdose on his wife's medications. Pt has stressor of death of his wife about 4 months ago and since that time he has multiple inpatient hospitalizations for similar presentations.  Today  Upon evaluation, pt reports reasons for presenting, stating, "It's the holidays and my wife's been dead for 4 months, so it's been really rough." He endorses SI at time of evaluation, but he denies having a plan, and he also states that he "could never kill myself" due to religious reasons, and he also added, "also, I couldn't do it because then I know I wouldn't be with her." He denies HI/AH/VH. He reports increased depression for the past 1 month, but he has also been having depression since her death. He reports insomnia,  low motivation, hopelessness, guilty feelings, low energy, and poor motivation. He denies symptoms of mania, OCD, and PTSD. He reports using xanax 26m three times per day and oxycodone 392mfive times per day, both prescribed by his PCP, but his UDS was negative on initial presentation.  Discussed with patient about treatment plan. He perseverates on receiving addderrall, xanax, and opiates. Pt was informed that he would not be restarted on opiates as he has not been regularly prescribed them and he has also been using illicit substances (cocaine on UDS in 10/2016), and pt immediately replied by requesting to be discharged. Pt will also be started on benzodiazepine withdrawal protocol due to his xanax use. He agreed to be resumed on cymbalta as he is unsure if he has been taking this medication outside the hospital, and he agreed to restart seroquel at higher bedtime dose. Pt had no further questions, comments, or concerns. PLAN OF CARE:  - Admit to inpatient psychiatry unit  -Bipolar I             - Change seroquel 1004mAM + 300m13ms to seroquel 100mg41m + 400mg 70m            -  Continue cymbalta 35m qDay             - Continue gabapentin 8071mTID - Opiate use disorder             - Start clonidine taper - benzodiazepine abuse             - Start librium taper  -Encourage participation in groups and therapeutic milieu -Discharge planning will be ongoing   ChMaris BergerMD

## 2017-02-09 NOTE — Progress Notes (Signed)
Patient has been resting in bed. Per sitter, patient has "readjusted his position aggressively and is awake". Patient has ambulated to the bathroom and is steady on his feet. Patient awake and requesting his sleep medications. Patient is A&O x4. Patient does not appear lethargic and is calm at this time. Patient does refuse vital signs to be taken at this time. Patient denies lightheadedness or dizziness. Patient provided snacks, Gatorade and scheduled medications per nursing judgement. Sitter remains at bedside. Will continue to monitor.

## 2017-02-10 MED ORDER — POLYETHYLENE GLYCOL 3350 17 GM/SCOOP PO POWD: 17.0000 g | Freq: Every day | ORAL | 0 refills | Status: DC

## 2017-02-10 MED ORDER — DIPHENHYDRAMINE HCL 25 MG PO CAPS
ORAL_CAPSULE | ORAL | Status: AC
  Administered 2017-02-10: 50 mg via ORAL
  Filled 2017-02-10: qty 2

## 2017-02-10 MED ORDER — LORAZEPAM 1 MG PO TABS
2.0000 mg | ORAL_TABLET | Freq: Once | ORAL | Status: AC
  Administered 2017-02-10: 2 mg via ORAL
  Filled 2017-02-10: qty 2

## 2017-02-10 MED ORDER — QUETIAPINE FUMARATE 400 MG PO TABS: 400.0000 mg | ORAL_TABLET | Freq: Every day | ORAL | 0 refills | Status: DC

## 2017-02-10 MED ORDER — FUROSEMIDE 40 MG PO TABS: 40.0000 mg | ORAL_TABLET | Freq: Every day | ORAL | 0 refills | Status: DC

## 2017-02-10 MED ORDER — DULOXETINE HCL 30 MG PO CPEP: 90.0000 mg | ORAL_CAPSULE | Freq: Every day | ORAL | 0 refills | Status: DC

## 2017-02-10 MED ORDER — ATORVASTATIN CALCIUM 20 MG PO TABS: 20.0000 mg | ORAL_TABLET | Freq: Every day | ORAL | 1 refills | Status: AC

## 2017-02-10 MED ORDER — HALOPERIDOL 5 MG PO TABS
ORAL_TABLET | ORAL | Status: AC
  Administered 2017-02-10: 5 mg via ORAL
  Filled 2017-02-10: qty 1

## 2017-02-10 MED ORDER — OMEPRAZOLE 40 MG PO CPDR: 40.0000 mg | DELAYED_RELEASE_CAPSULE | Freq: Two times a day (BID) | ORAL | 0 refills | Status: AC

## 2017-02-10 MED ORDER — ALBUTEROL SULFATE HFA 108 (90 BASE) MCG/ACT IN AERS: 1.0000 | INHALATION_SPRAY | Freq: Four times a day (QID) | RESPIRATORY_TRACT | 0 refills | Status: AC | PRN

## 2017-02-10 MED ORDER — QUETIAPINE FUMARATE 100 MG PO TABS: 100.0000 mg | ORAL_TABLET | Freq: Every day | ORAL | 0 refills | Status: DC

## 2017-02-10 MED ORDER — COMBIVENT RESPIMAT 20-100 MCG/ACT IN AERS: 1.0000 | INHALATION_SPRAY | Freq: Four times a day (QID) | RESPIRATORY_TRACT | 0 refills | Status: DC | PRN

## 2017-02-10 MED ORDER — HALOPERIDOL 5 MG PO TABS
5.0000 mg | ORAL_TABLET | Freq: Once | ORAL | Status: AC
  Administered 2017-02-10: 5 mg via ORAL
  Filled 2017-02-10: qty 1

## 2017-02-10 MED ORDER — URSODIOL 300 MG PO CAPS: 300.0000 mg | ORAL_CAPSULE | Freq: Two times a day (BID) | ORAL | Status: DC

## 2017-02-10 MED ORDER — DIPHENHYDRAMINE HCL 50 MG PO CAPS
50.0000 mg | ORAL_CAPSULE | Freq: Once | ORAL | Status: AC
  Administered 2017-02-10: 50 mg via ORAL
  Filled 2017-02-10: qty 1

## 2017-02-10 MED ORDER — GLUCERNA SHAKE PO LIQD: 237.0000 mL | Freq: Three times a day (TID) | ORAL | Status: DC

## 2017-02-10 MED ORDER — HYDROXYZINE HCL 25 MG PO TABS: 25.0000 mg | ORAL_TABLET | Freq: Four times a day (QID) | ORAL | 0 refills | Status: DC | PRN

## 2017-02-10 MED ORDER — GABAPENTIN 400 MG PO CAPS: 800.0000 mg | ORAL_CAPSULE | Freq: Three times a day (TID) | ORAL | 0 refills | Status: DC

## 2017-02-10 MED ORDER — TRAZODONE HCL 150 MG PO TABS
150.0000 mg | ORAL_TABLET | Freq: Every evening | ORAL | 0 refills | Status: DC | PRN
Start: 2017-02-10 — End: 2017-06-01

## 2017-02-10 NOTE — Progress Notes (Signed)
1:1 Nursing Progress Note  D: Patient remains awake in bed. Patient is observed changing positions aggressively. Sitter reports patient is restless and "moves every few seconds". Patient has changed from the medical bed by the door to the standard bed by the window several times tonight. HOB elevated in medical bed to help patient with his sleep apnea and he is encouraged to stay elevated when sleeping. Patient provided multiple pillows. Patient reports he refuses to use a C-PAP machine. Patient has been provided PRN medications per request. Patient has been seen by provider NP Nira ConnJason Berry. Patient vital signs are stable. Patient in no acute distress. Patient denies pain to his head but reports on-going chronic back pain. Medications provided as ordered. Environment is secured. Sitter observed with patient per [facility] policy.  A: Patient remains on 1:1 observation per provider orders. High fall risk precautions in place. Patient safety monitored with q15 minute safety checks.  R: Patient remains safe on the unit at this time. Will continue to monitor with 1:1 sitter, q15 min safety checks & q4 hour nursing assessments.

## 2017-02-10 NOTE — [facility] Counselor (Signed)
Pt scheduled for discharge within 24 hours of admission. Psychosocial assessment not required.   Trula SladeHeather Smart, MSW, LCSW Clinical Social Worker 02/10/2017 9:34 AM

## 2017-02-10 NOTE — Plan of Care (Signed)
Safety Care Plan Documentation  Goal: Periods of time without injury will increase Intervention: Patient is on q15 minute safety checks and high fall risk precautions. Patient on 1:1 observation for safety. Outcome: Patient remains safe and uninjured at this time  02/10/2017 3:10 AM - Progressing by Ferrel Loganollazo, Hong Timm A, RN

## 2017-02-10 NOTE — [facility] Suicide Risk Assessment (Signed)
[facility]   Principal Problem: Bipolar 1 disorder, mixed, moderate (HCC) Discharge Diagnoses:  Patient Active Problem List   Diagnosis Date Noted  . MDD (major depressive disorder), recurrent severe, without psychosis (HCC) [F33.2] 02/09/2017  . Cellulitis of left lower extremity [L03.116]   . Hand abscess [L02.519]   . IVDU (intravenous drug user) [F19.90]   . Hepatitis C antibody positive in blood [R76.8]   . Cellulitis in diabetic foot (HCC) [W29.562[E11.628, L03.119] 01/10/2017  . Diabetic foot ulcer (HCC) [Z30.865[E11.621, L97.509] 01/10/2017  . Hypotension [I95.9] 11/11/2016  . Suicide attempt (HCC) [T14.91XA] 10/17/2016  . Acute and chronic respiratory failure with hypercapnia (HCC) [J96.22]   . Pulmonary edema [J81.1]   . Syncope [R55]   . Respiratory failure (HCC) [J96.90] 01/04/2016  . Drug overdose [T50.901A]   . Acute encephalopathy [G93.40] 01/01/2016  . Hyponatremia [E87.1] 01/01/2016  . Chronic pain syndrome [G89.4] 10/22/2015  . Healthcare maintenance [Z00.00] 10/22/2015  . Hoarse voice quality [R49.0] 10/11/2015  . MDD (major depressive disorder), recurrent episode, moderate (HCC) [F33.1] 09/04/2015  . Polysubstance dependence including opioid type drug, episodic abuse (HCC) [F11.20, F19.20] 09/04/2015  . RUQ abdominal pain [R10.11]   . Cholelithiasis [K80.20] 05/17/2015  . Antisocial personality disorder (HCC) [F60.2] 04/11/2015  . Opioid use disorder, severe, dependence (HCC) [F11.20] 04/11/2015  . Benzodiazepine dependence (HCC) [F13.20] 04/11/2015  . Tobacco use disorder [F17.200] 04/10/2015  . Narcotic dependency, continuous (HCC) [F11.20] 03/14/2014  . Chronic back pain greater than 3 months duration [M54.9, G89.29] 01/28/2014  . Gastroesophageal reflux disease with esophagitis [K21.0] 01/28/2014  . Essential hypertension, benign [I10] 01/28/2014  . Diabetes mellitus type II, controlled (HCC) [E11.9] 01/28/2014  . Bipolar 1 disorder, mixed,  moderate (HCC) [F31.62] 01/28/2014  . Morbid obesity (HCC) [E66.01] 01/28/2014  . TIA (transient ischemic attack) [G45.9] 09/02/2013  . HLD (hyperlipidemia) [E78.5] 08/06/2013  . OSA on CPAP [G47.33, Z99.89] 08/06/2013    Total Time spent with patient: 30 minutes  Musculoskeletal: Strength & Muscle Tone: within normal limits Gait & Station: normal Patient leans: N/A  Psychiatric Specialty Exam: Review of Systems  Constitutional: Negative for chills and fever.  Respiratory: Negative for cough and shortness of breath.   Gastrointestinal: Negative for heartburn and nausea.  Musculoskeletal: Positive for back pain and joint pain.  Psychiatric/Behavioral: Negative for depression, hallucinations and suicidal ideas. The patient is not nervous/anxious.     Blood pressure (!) 142/68, pulse 79, temperature 98.3 F (36.8 C), resp. rate 14, height 6\' 2"  (1.88 m), weight (!) 162 kg (357 lb 4 oz), SpO2 98 %.Body mass index is 45.87 kg/m.  General Appearance: Casual and Disheveled  Eye Contact::  Good  Speech:  Clear and Coherent and Normal Rate  Volume:  Normal  Mood:  Irritable  Affect:  Congruent and Constricted  Thought Process:  Coherent and Goal Directed  Orientation:  Full (Time, Place, and Person)  Thought Content:  Logical  Suicidal Thoughts:  No  Homicidal Thoughts:  No  Memory:  Immediate;   Good Recent;   Good Remote;   Good  Judgement:  Poor  Insight:  Lacking  Psychomotor Activity:  Normal  Concentration:  Poor  Recall:  Poor  Fund of Knowledge:Poor  Language: Poor  Akathisia:  No  Handed:    AIMS (if indicated):     Assets:  Communication Skills  Sleep:  Number of Hours: 1.75  Cognition: WNL  ADL's:  Intact   Mental Status Per Nursing Assessment::   On Admission:  NA  Demographic Factors:  Male, Divorced or widowed, Caucasian, Living alone and Unemployed  Loss Factors: Legal issues and Financial problems/change in socioeconomic status  Historical  Factors: Family history of mental illness or substance abuse and Impulsivity  Risk Reduction Factors:   Religious beliefs about death, Positive social support, Positive therapeutic relationship and Positive coping skills or problem solving skills  Continued Clinical Symptoms:  Severe Anxiety and/or Agitation Bipolar Disorder:   Mixed State Alcohol/Substance Abuse/Dependencies More than one psychiatric diagnosis Unstable or Poor Therapeutic Relationship Previous Psychiatric Diagnoses and Treatments Medical Diagnoses and Treatments/Surgeries  Cognitive Features That Contribute To Risk:  Closed-mindedness and Thought constriction (tunnel vision)    Suicide Risk:  Minimal: No identifiable suicidal ideation.  Patients presenting with no risk factors but with morbid ruminations; may be classified as minimal risk based on the severity of the depressive symptoms  Follow-up Information    [facility] Follow up.   Specialty:  Behavioral Health Why:  Walk in within 3 days of hospital discharg to be assessed for mental health services and counseling. (message left for scheduler). Walk in hours: Mon-Fri 8am-9am. Thank you.  [address] [place] [address] [phone]         Subjective Data:  Bryan Gallegos is a 41 y/o M with history of Bipolar 1 and multiple substance use disorders who presented voluntarily to [facility] with worsening symptoms of depression and SI with plan to overdose on his wife's medications. Pt has stressor of death of his wife about 4 months ago and since that time he has multiple inpatient hospitalizations for similar presentations. Upon initial evaluation, pt demanded to be restarted on alprazolam, adderall, and high-dose of oxycodone; however, he had not been prescribed those medications regularly in recent weeks and he had UDS+ for cocaine within the last 90 days. Pt had dose of seroquel increased to address his mood symptoms and he was placed on  opiate withdrawal protocol and CIWA for benzodiazepine withdrawal, but immediately after conclusion of interview, pt signed 72-hour release request. He has been refusing librium. He had episode of banging his head on the wall last evening which he stated was due to untreated pain, and he was placed on 1:1 with staff as a result.  Today upon evaluation, pt states he is doing "not good." He reports that his pain caused him to be up overnight and that is why he had episode of banging his head against the wall. Pt expresses that he would like to discharge. He states, "I'm not going to do anything to hurt myself." He denies SI/HI/AH/VH. He agrees to continue taking his current psychotropic medications as prescribed. He was able to engage in safety planning including plan to return to [facility] or contact emergency services if he feels unable to maintain his own safety. Pt had no further questions, comments, or concerns.     Plan Of Care/Follow-up recommendations:  - Discharge to outpatient level of care  -Bipolar I             - Continue seroquel 100mg  qAM + 400mg  qhs             - Continue cymbalta 90mg  qDay             - Continue gabapentin 800mg  TID - Opiate use disorder             - Discontinue clonidine taper - benzodiazepine abuse             -  Discontinue CIWA with librium taper  Activity:  as tolerated Diet:  normal Tests:  NA Other:  see above for DC plan  Micheal Likenshristopher T Carlos Quackenbush, MD 02/10/2017, 9:50 AM

## 2017-02-10 NOTE — Progress Notes (Signed)
NUTRITION ASSESSMENT  Pt identified as at risk on the Malnutrition Screen Tool  INTERVENTION: 1. Supplements: Glucerna Shake po TID, each supplement provides 220 kcal and 10 grams of protein  NUTRITION DIAGNOSIS: Unintentional weight loss related to sub-optimal intake as evidenced by pt report.   Goal: Pt to meet >/= 90% of their estimated nutrition needs.  Monitor:  PO intake  Assessment:  Pt admitted with depression. [facility] Pt had a DM foot ulcer at that time and was ordered nutritional supplements. Pt weighed 348 lb at that time, now is 357 lb.  Per RN notes, pt is accepting snacks and drinking ginger ale. Will order Glucerna shakes for additional protein.   Height: Ht Readings from Last 1 Encounters:  02/09/17 6\' 2"  (1.88 m)    Weight: Wt Readings from Last 1 Encounters:  02/09/17 (!) 357 lb 4 oz (162 kg)    Weight Hx: Wt Readings from Last 10 Encounters:  02/09/17 (!) 357 lb 4 oz (162 kg)  01/10/17 (!) 348 lb 5.2 oz (158 kg)  01/07/17 (!) 350 lb (158.8 kg)  09/11/16 (!) 377 lb (171 kg)  09/07/16 (!) 355 lb (161 kg)  08/09/16 (!) 355 lb (161 kg)  01/06/16 (!) 353 lb 1.6 oz (160.2 kg)  01/02/16 (!) 370 lb 2.4 oz (167.9 kg)  10/22/15 (!) 385 lb 3.2 oz (174.7 kg)  10/15/15 (!) 381 lb 12.8 oz (173.2 kg)    BMI:  Body mass index is 45.87 kg/m. Pt meets criteria for morbid obesity based on current BMI.  Estimated Nutritional Needs: Kcal: 25-30 kcal/kg Protein: > 1 gram protein/kg Fluid: 1 ml/kcal  Diet Order: Diet regular Room service appropriate? No; Fluid consistency: Thin Pt is also offered choice of unit snacks mid-morning and mid-afternoon.  Pt is eating as desired.   Lab results and medications reviewed.   Tilda FrancoLindsey Nieves Barberi, MS, RD, LDN [facility] Clinical Dietitian Pager: [phone] After Hours Pager: [phone]

## 2017-02-10 NOTE — Progress Notes (Signed)
  [facility] :  Will you be returning to the same living situation after discharge:  Yes,  home At discharge, do you have transportation home?: Yes,  family member/friend Do you have the ability to pay for your medications: Yes,  medicare  Release of information consent forms completed and submitted to medical records by CSW.  Patient to Follow up at: Follow-up Information    [facility] Follow up.   Specialty:  Behavioral Health Why:  Walk in within 3 days of hospital discharg to be assessed for mental health services and counseling. (message left for scheduler). Walk in hours: Mon-Fri 8am-9am. Thank you.  Contact information: [address] [place] [id number] [phone]           Next level of care provider [facility] Link:no  Safety Planning and Suicide Prevention discussed: Yes,  SPE completed with pt; pt declined to consent to family contact. SPI pamphlet and Mobile Crisis information provided to pt.   Have you used any form of tobacco in the last 30 days? (Cigarettes, Smokeless Tobacco, Cigars, and/or Pipes): Yes  Has patient been referred to the Quitline?: Patient refused referral  Patient has been referred for addiction treatment: Yes  Pulte HomesHeather N Smart, LCSW 02/10/2017, 9:35 AM

## 2017-02-10 NOTE — Discharge Summary (Signed)
Physician Discharge Summary Note  Patient:  Bryan Gallegos is an 41 y.o., male MRN:  [id number] DOB:  03-Apr-1975 Patient phone:  [phone] (home)  Patient address:   [address] [place] [address],   Total Time spent with patient: Greater than 30 minutes  Date of Admission:  02/09/2017  Date of Discharge: 02/10/17  Reason for Admission: Suicidal ideations.  Principal Problem: Bipolar 1 disorder, mixed, moderate (HCC)  Discharge Diagnoses: Patient Active Problem List   Diagnosis Date Noted  . MDD (major depressive disorder), recurrent severe, without psychosis (HCC) [F33.2] 02/09/2017    Priority: High  . Cellulitis of left lower extremity [L03.116]   . Hand abscess [L02.519]   . IVDU (intravenous drug user) [F19.90]   . Hepatitis C antibody positive in blood [R76.8]   . Cellulitis in diabetic foot (HCC) [O13.086, L03.119] 01/10/2017  . Diabetic foot ulcer (HCC) [V78.469, L97.509] 01/10/2017  . Hypotension [I95.9] 11/11/2016  . Suicide attempt (HCC) [T14.91XA] 10/17/2016  . Acute and chronic respiratory failure with hypercapnia (HCC) [J96.22]   . Pulmonary edema [J81.1]   . Syncope [R55]   . Respiratory failure (HCC) [J96.90] 01/04/2016  . Drug overdose [T50.901A]   . Acute encephalopathy [G93.40] 01/01/2016  . Hyponatremia [E87.1] 01/01/2016  . Chronic pain syndrome [G89.4] 10/22/2015  . Healthcare maintenance [Z00.00] 10/22/2015  . Hoarse voice quality [R49.0] 10/11/2015  . MDD (major depressive disorder), recurrent episode, moderate (HCC) [F33.1] 09/04/2015  . Polysubstance dependence including opioid type drug, episodic abuse (HCC) [F11.20, F19.20] 09/04/2015  . RUQ abdominal pain [R10.11]   . Cholelithiasis [K80.20] 05/17/2015  . Antisocial personality disorder (HCC) [F60.2] 04/11/2015  . Opioid use disorder, severe, dependence (HCC) [F11.20] 04/11/2015  . Benzodiazepine dependence (HCC) [F13.20] 04/11/2015  . Tobacco use disorder [F17.200]  04/10/2015  . Narcotic dependency, continuous (HCC) [F11.20] 03/14/2014  . Chronic back pain greater than 3 months duration [M54.9, G89.29] 01/28/2014  . Gastroesophageal reflux disease with esophagitis [K21.0] 01/28/2014  . Essential hypertension, benign [I10] 01/28/2014  . Diabetes mellitus type II, controlled (HCC) [E11.9] 01/28/2014  . Bipolar 1 disorder, mixed, moderate (HCC) [F31.62] 01/28/2014  . Morbid obesity (HCC) [E66.01] 01/28/2014  . TIA (transient ischemic attack) [G45.9] 09/02/2013  . HLD (hyperlipidemia) [E78.5] 08/06/2013  . OSA on CPAP [G47.33, Z99.89] 08/06/2013   History of Present Illness: [name] is a 41 y/o M with history of Bipolar 1 and multiple substance use disorders who presented voluntarily to [facility] with worsening symptoms of depression and SI with plan to overdose on his wife's medications. Pt has stressor of death of his wife about 4 months ago and since that time he has multiple inpatient hospitalizations for similar presentations.  As noted above, Kosisochukwu was admitted to the [facility] adult unit for complaints of suicidal ideations triggered by worsening symptoms of depression. He cited the recent death of his wife as the trigger. During the admission assessment, Aniel reported that he has a lot of pain to his lower extremity & back areas & asked for re-initiation of his opioid drugs. He stated that he has been on the opioid  tablets for his chronic back pain five times daily. This was declined as his toxicology reports showed no presence of any narcotic medications. However, his other pertinent home medications were reviewed, reconciled & reinstated. He was enrolled & encouraged to participate in the group counseling sessions being offered & held on this unit.   At the time of his admission assessment. Deyvi was noted to be agitated,  angry, snappy, short tempered & inpatient when dealing with the staff. Soon after his treatment orders were placed without  the opioid drugs, Lathan requested to be discharged to his home. He stated that he is no longer depressed, suicidal or has any intention to hurt himself or others. He stated that he wanted to be discharged to continue substance abuse treatment, mental health care & medical care on his own terms on an outpatient basis.   Upon discharge, he adamantly denies any SIHI, AVH, delusional thought, paranoia substance withdrawal symptoms. Ahkeem was discharged with all personal belongings in no apparent distress. He was sent home on all his pertinent home medications as reported with prescriptions given. He agrees to follow-up care as noted below.  Transportation per his arrangement (family).  Past Medical History:  Past Medical History:  Diagnosis Date  . Adult ADHD   . Anaphylactic reaction   . Anxiety   . Asthma   . Bipolar disorder (HCC)   . Complication of anesthesia    Per patient difficult intubation;  . Diabetes mellitus   . Difficult intubation    Per patient  . Drug overdose   . Heart valve disorder    s/p echocardiogram  . Hyperlipidemia   . Hypertension   . Morbidly obese (HCC)   . OSA (obstructive sleep apnea)   . Polysubstance abuse (HCC)   . Transient cerebral ischemia    Unknown    Past Surgical History:  Procedure Laterality Date  . CARPAL TUNNEL RELEASE    . ESOPHAGOGASTRODUODENOSCOPY N/A 04/05/2015   Procedure: ESOPHAGOGASTRODUODENOSCOPY (EGD);  Surgeon: Malissa Hippo, MD;  Location: [facility];  Service: Endoscopy;  Laterality: N/A;  . ESOPHAGOGASTRODUODENOSCOPY (EGD) WITH PROPOFOL N/A 05/21/2015   Procedure: ESOPHAGOGASTRODUODENOSCOPY (EGD) WITH PROPOFOL;  Surgeon: Meryl Dare, MD;  Location: [facility];  Service: Endoscopy;  Laterality: N/A;  . NOSE SURGERY    . TOOTH EXTRACTION    . WISDOM TOOTH EXTRACTION     Family History:  Family History  Problem Relation Age of Onset  . CAD Mother        Living  . Diabetes Mellitus II Mother   . Stroke Mother    . Hypertension Mother   . Congestive Heart Failure Mother   . Kidney disease Mother   . Fibromyalgia Mother   . Thyroid disease Mother   . Hyperlipidemia Mother   . Liver disease Mother   . Alcoholism Father 27       Deceased  . Arthritis Maternal Grandmother   . Congestive Heart Failure Maternal Grandmother   . Hypertension Maternal Grandmother   . Lung cancer Maternal Grandfather   . Colon cancer Maternal Aunt   . Stomach cancer Maternal Aunt   . Heart disease Other        Paternal & Maternal  . Crohn's disease Other   . Hypertension Other        Paternal & Maternal  . Hypertension Brother        x3  . Hypertension Sister        #1  . Bipolar disorder Sister        #1  . ADD / ADHD Son        x3  . Bipolar disorder Son        x3  . Asperger's syndrome Son    Social History: Pt is single, lives with girlfriend of 20 years, is on SSD (per report), reports he went up to college, and that  his girlfriend is his payee. Currently is homeless , since he was in jail and could not pay his rent. Reports hx of legal issues. Social History   Substance and Sexual Activity  Alcohol Use No  . Alcohol/week: 0.0 oz     Social History   Substance and Sexual Activity  Drug Use Yes  . Types: Benzodiazepines, Heroin    Social History   Socioeconomic History  . Marital status: Single    Spouse name: None  . Number of children: None  . Years of education: None  . Highest education level: None  Social Needs  . Financial resource strain: None  . Food insecurity - worry: None  . Food insecurity - inability: None  . Transportation needs - medical: None  . Transportation needs - non-medical: None  Occupational History  . None  Tobacco Use  . Smoking status: Current Every Day Smoker    Packs/day: 0.50    Years: 23.00    Pack years: 11.50    Types: Cigarettes  . Smokeless tobacco: Never Used  Substance and Sexual Activity  . Alcohol use: No    Alcohol/week: 0.0 oz  . Drug  use: Yes    Types: Benzodiazepines, Heroin  . Sexual activity: Not Currently    Partners: Female    Birth control/protection: None  Other Topics Concern  . None  Social History Narrative  . None   Hospital Course:    Musculoskeletal: Strength & Muscle Tone: within normal limits Gait & Station: normal Patient leans: N/A  Psychiatric Specialty Exam: Review of Systems  Constitutional: Negative.   HENT: Negative.   Eyes: Negative.   Respiratory: Negative.   Cardiovascular: Negative.   Gastrointestinal: Negative.   Genitourinary: Negative.   Musculoskeletal: Negative.   Skin: Negative.   Neurological: Negative.   Endo/Heme/Allergies: Negative.   Psychiatric/Behavioral: Negative.     Blood pressure (!) 142/68, pulse 79, temperature 98.3 F (36.8 C), resp. rate 14, height 6\' 2"  (1.88 m), weight (!) 162 kg (357 lb 4 oz), SpO2 98 %.Body mass index is 45.87 kg/m.  See Md's SRA   Have you used any form of tobacco in the last 30 days? (Cigarettes, Smokeless Tobacco, Cigars, and/or Pipes): Yes  Has this patient used any form of tobacco in the last 30 days? (Cigarettes, Smokeless Tobacco, Cigars, and/or Pipes) Yes, Yes, A prescription for an FDA-approved tobacco cessation medication was offered at discharge and the patient refused  Metabolic Disorder Labs:  Lab Results  Component Value Date   HGBA1C 5.7 (H) 01/11/2017   MPG 116.89 01/11/2017   MPG 126 01/04/2016   No results found for: PROLACTIN Lab Results  Component Value Date   CHOL 148 09/04/2013   TRIG 327 (H) 01/06/2016   HDL 19 (L) 09/04/2013   CHOLHDL 7.8 09/04/2013   VLDL 46 (H) 09/04/2013   LDLCALC 83 09/04/2013   LDLCALC 119 (H) 08/07/2013   See Psychiatric Specialty Exam and Suicide Risk Assessment completed by Attending Physician prior to discharge.  Discharge destination:  Home  Is patient on multiple antipsychotic therapies at discharge:  No   Has Patient had three or more failed trials of antipsychotic  monotherapy by history:  No  Recommended Plan for Multiple Antipsychotic Therapies: NA  Allergies as of 02/10/2017      Reactions   Ace Inhibitors Anaphylaxis   Atenolol Anaphylaxis   Lisinopril Anaphylaxis   Prednisone Hives   Ibuprofen Itching, Swelling, Other (See Comments)   Hand/feet swelling  Tylenol [acetaminophen] Other (See Comments)   Reaction:  GI bleeding       Medication List    STOP taking these medications   alprazolam 2 MG tablet Commonly known as:  XANAX   amphetamine-dextroamphetamine 30 MG tablet Commonly known as:  ADDERALL   cloNIDine 0.2 MG tablet Commonly known as:  CATAPRES   Fluticasone-Salmeterol 250-50 MCG/DOSE Aepb Commonly known as:  ADVAIR   metFORMIN 1000 MG tablet Commonly known as:  GLUCOPHAGE   metoprolol tartrate 50 MG tablet Commonly known as:  LOPRESSOR   oxycodone 30 MG immediate release tablet Commonly known as:  ROXICODONE   ranitidine 150 MG tablet Commonly known as:  ZANTAC   senna 8.6 MG Tabs tablet Commonly known as:  SENOKOT   sitaGLIPtin 100 MG tablet Commonly known as:  JANUVIA   testosterone cypionate 200 MG/ML injection Commonly known as:  DEPOTESTOSTERONE CYPIONATE     TAKE these medications     Indication  albuterol 108 (90 Base) MCG/ACT inhaler Commonly known as:  PROVENTIL HFA;VENTOLIN HFA Inhale 1-2 puffs into the lungs every 6 (six) hours as needed for wheezing or shortness of breath.  Indication:  Asthma   atorvastatin 20 MG tablet Commonly known as:  LIPITOR Take 1 tablet (20 mg total) by mouth at bedtime. For high cholesterol What changed:  additional instructions  Indication:  Inherited Homozygous Hypercholesterolemia   COMBIVENT RESPIMAT 20-100 MCG/ACT Aers respimat Generic drug:  Ipratropium-Albuterol Inhale 1 puff into the lungs every 6 (six) hours as needed for wheezing or shortness of breath. What changed:  how to take this  Indication:  Asthma   DULoxetine 30 MG  capsule Commonly known as:  CYMBALTA Take 3 capsules (90 mg total) by mouth daily. For depression What changed:  additional instructions  Indication:  Major Depressive Disorder   furosemide 40 MG tablet Commonly known as:  LASIX Take 1 tablet (40 mg total) by mouth daily. For swellings What changed:  additional instructions  Indication:  Edema, High Blood Pressure Disorder   gabapentin 400 MG capsule Commonly known as:  NEURONTIN Take 2 capsules (800 mg total) by mouth 3 (three) times daily. For agitation/Neuropathy What changed:  additional instructions  Indication:  Agitation, Neuropathic Pain   hydrOXYzine 25 MG tablet Commonly known as:  ATARAX/VISTARIL Take 1 tablet (25 mg total) by mouth every 6 (six) hours as needed for anxiety.  Indication:  Feeling Anxious   omeprazole 40 MG capsule Commonly known as:  PRILOSEC Take 1 capsule (40 mg total) by mouth 2 (two) times daily. For acid reflux What changed:  additional instructions  Indication:  Gastroesophageal Reflux Disease   polyethylene glycol powder powder Commonly known as:  MIRALAX Take 17 g by mouth daily. For constipation What changed:  additional instructions  Indication:  Constipation   QUEtiapine 400 MG tablet Commonly known as:  SEROQUEL Take 1 tablet (400 mg total) by mouth at bedtime. For mood control What changed:  You were already taking a medication with the same name, and this prescription was added. Make sure you understand how and when to take each.  Indication:  Mood control   QUEtiapine 100 MG tablet Commonly known as:  SEROQUEL Take 1 tablet (100 mg total) by mouth daily. For mood control What changed:    how much to take  when to take this  additional instructions  Indication:  Mood control   traZODone 150 MG tablet Commonly known as:  DESYREL Take 1 tablet (150 mg total) by mouth  at bedtime as needed for sleep. What changed:    when to take this  reasons to take this  Indication:   Trouble Sleeping   ursodiol 300 MG capsule Commonly known as:  ACTIGALL Take 1 capsule (300 mg total) by mouth 2 (two) times daily. For kidney stone What changed:  additional instructions  Indication:  Gallstones      Follow-up Information    [facility] up.   Specialty:  Behavioral Health Why:  Walk in within 3 days of hospital discharg to be assessed for mental health services and counseling. (message left for scheduler). Walk in hours: Mon-Fri 8am-9am. Thank you.  Contact information: [address] [place] [address] [phone]          Follow-up recommendation: Activity:  As tolerated Diet: As recommended by your primary care doctor. Keep all scheduled follow-up appointments as recommended.  Comment: Patient is instructed prior to discharge to: Take all medications as prescribed by his/her mental healthcare provider. Report any adverse effects and or reactions from the medicines to his/her outpatient provider promptly. Patient has been instructed & cautioned: To not engage in alcohol and or illegal drug use while on prescription medicines. In the event of worsening symptoms, patient is instructed to call the crisis hotline, 911 and or go to the nearest ED for appropriate evaluation and treatment of symptoms. To follow-up with his/her primary care provider for your other medical issues, concerns and or health care needs.  Signed: Armandina StammerAgnes Nwoko, NP, PMHNP, FNP-BC 02/11/2017, 3:25 PM   Patient seen, Suicide Assessment Completed.  Disposition Plan Reviewed   Su HoffMitchell Wiest is a 41 y/o M with history of Bipolar 1 and multiple substance use disorders who presented voluntarily to [facility] with worsening symptoms of depression and SI with plan to overdose on his wife's medications. Pt has stressor of death of his wife about 4 months ago and since that time he has multiple inpatient hospitalizations for similar presentations. Upon initial evaluation, pt demanded to be  restarted on alprazolam, adderall, and high-dose of oxycodone; however, he had not been prescribed those medications regularly in recent weeks and he had UDS+ for cocaine within the last 90 days. Pt had dose of seroquel increased to address his mood symptoms and he was placed on opiate withdrawal protocol and CIWA for benzodiazepine withdrawal, but immediately after conclusion of interview, pt signed 72-hour release request. He has been refusing librium. He had episode of banging his head on the wall last evening which he stated was due to untreated pain, and he was placed on 1:1 with staff as a result.  Today upon evaluation, pt states he is doing "not good." He reports that his pain caused him to be up overnight and that is why he had episode of banging his head against the wall. Pt expresses that he would like to discharge. He states, "I'm not going to do anything to hurt myself." He denies SI/HI/AH/VH. He agrees to continue taking his current psychotropic medications as prescribed. He was able to engage in safety planning [facility] or contact emergency services if he feels unable to maintain his own safety. Pt had no further questions, comments, or concerns.    Plan Of Care/Follow-up recommendations:  - Discharge to outpatient level of care  -Bipolar I - Continue seroquel 100mg  qAM + 400mg  qhs - Continue cymbalta 90mg  qDay - Continue gabapentin 800mg  TID - Opiate use disorder - Discontinue clonidine taper - benzodiazepine abuse - Discontinue CIWA  with librium taper  Activity:  as tolerated Diet:  normal Tests:  NA Other:  see above for DC plan  Micheal Likens, MD

## 2017-02-10 NOTE — [facility] Suicide Risk Assessment (Signed)
[facility] INPATIENT:  Family/Significant Other Suicide Prevention Education  Suicide Prevention Education:  Patient Refusal for Family/Significant Other Suicide Prevention Education: The patient Bryan Gallegos has refused to provide written consent for family/significant other to be provided Family/Significant Other Suicide Prevention Education during admission and/or prior to discharge.  Physician notified.  SPE completed with pt, as pt refused to consent to family contact. SPI pamphlet provided to pt and pt was encouraged to share information with support network, ask questions, and talk about any concerns relating to SPE. Pt denies access to guns/firearms and verbalized understanding of information provided. Mobile Crisis information also provided to pt.   Marques Ericson N Smart LCSW 02/10/2017, 9:34 AM

## 2017-02-10 NOTE — Progress Notes (Signed)
Patient ID: Bryan Gallegos, male   DOB: 07-06-1975, 41 y.o.   MRN: [id number] Patient educated on all of his discharge instructions and verbalized understanding of all.  Pt left hospital with all of his discharge instructions and personal belongings, and prescription scripts.  Transportation was provided by cab driver who met him at the entrance of the hospital.

## 2017-02-12 IMAGING — CT CT ABD-PELV W/ CM
2 of 5 series · 17 of 46 positions shown, 19 images · IV contrast (OMNIPAQUE 300)
Comparison: 03/30/2015

CLINICAL DATA: Right upper quadrant abdominal pain, hematemesis,
nausea and vomiting. Onset today.

EXAM:
CT ABDOMEN AND PELVIS WITH CONTRAST
TECHNIQUE: Multidetector CT imaging of the abdomen and pelvis was performed
using the standard protocol following bolus administration of
intravenous contrast.
CONTRAST:  25mL OMNIPAQUE IOHEXOL 300 MG/ML SOLN, 100mL OMNIPAQUE
IOHEXOL 300 MG/ML SOLN

[Series 2: abd/pel with · axial · 0.95mm/px · z∈[+1012,+1467]mm · 14 of 103 slices shown, 16 images]
[im 6/103  soft-tissue]
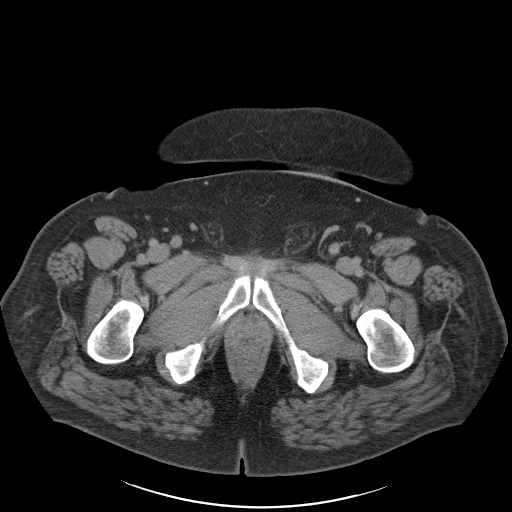
[im 6/103  bone]
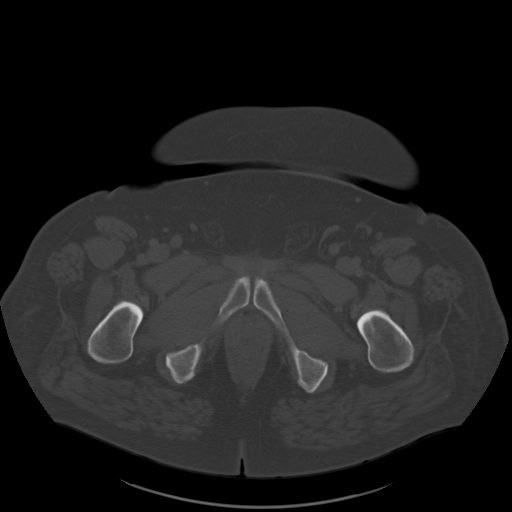
[im 11/103  soft-tissue]
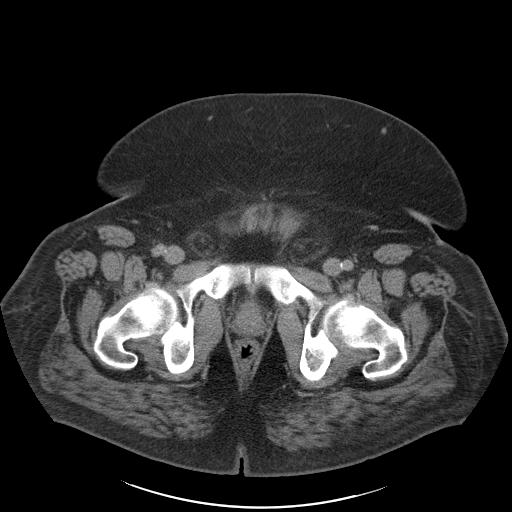
[im 22/103  soft-tissue]
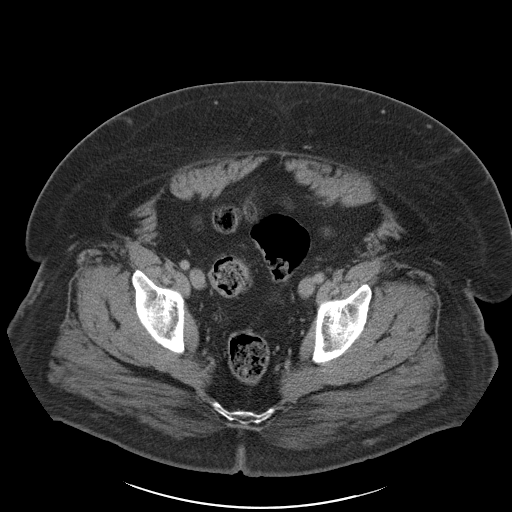
[im 27/103  soft-tissue]
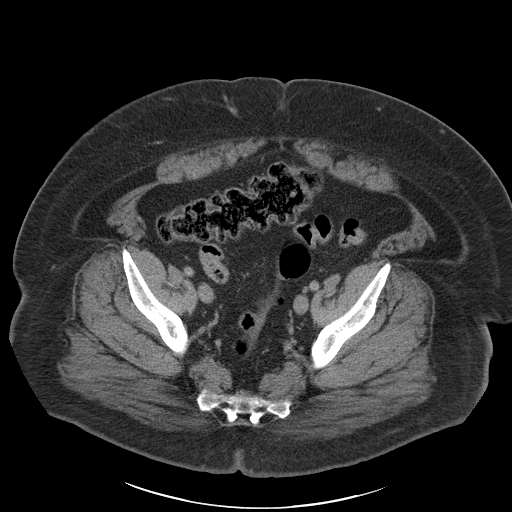
[im 33/103  soft-tissue]
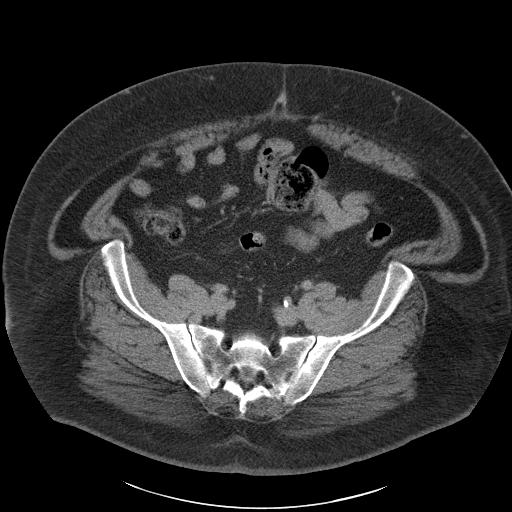
[im 43/103  soft-tissue]
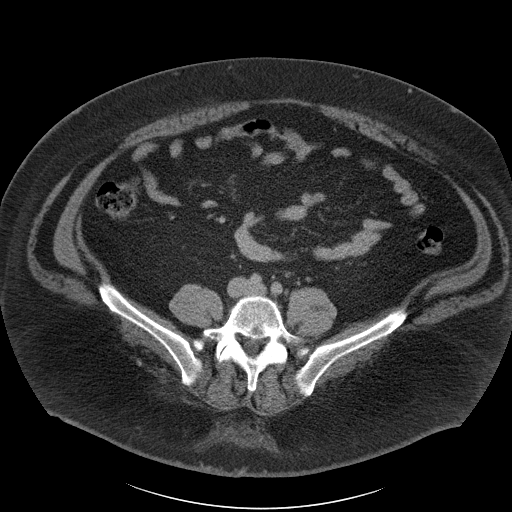
[im 49/103  soft-tissue]
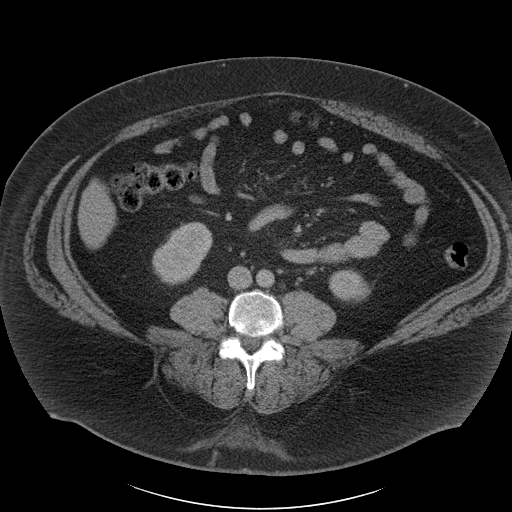
[im 54/103  soft-tissue]
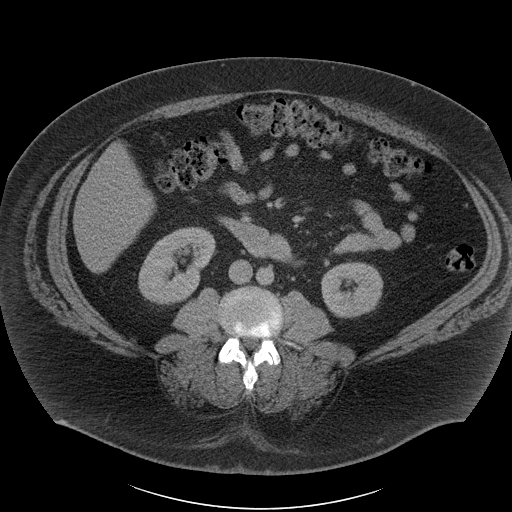
[im 60/103  soft-tissue]
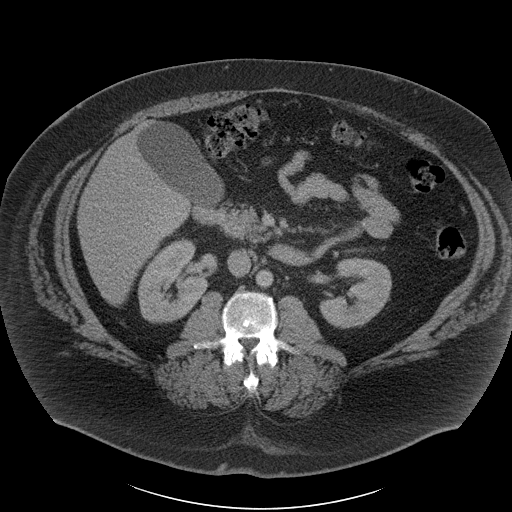
[im 60/103  bone]
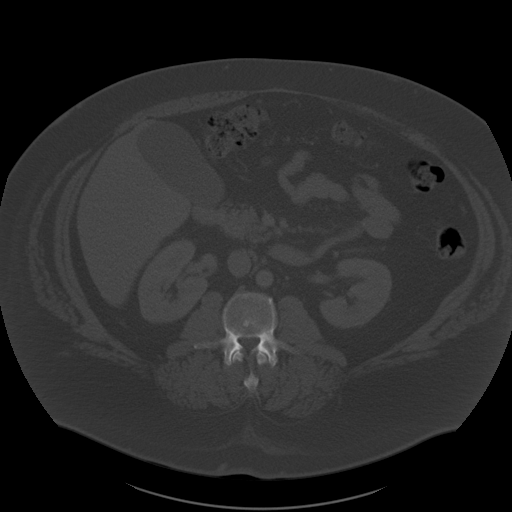
[im 70/103  soft-tissue]
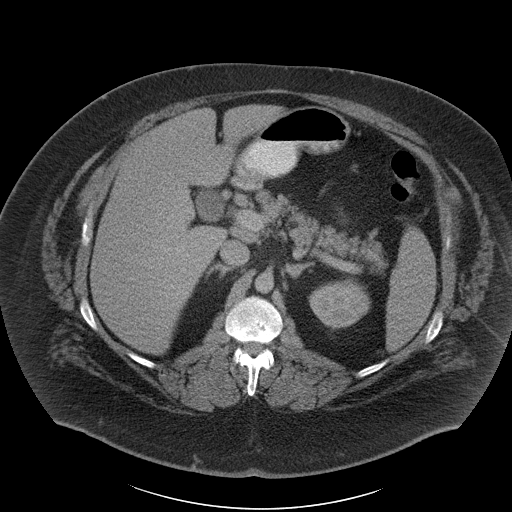
[im 76/103  soft-tissue]
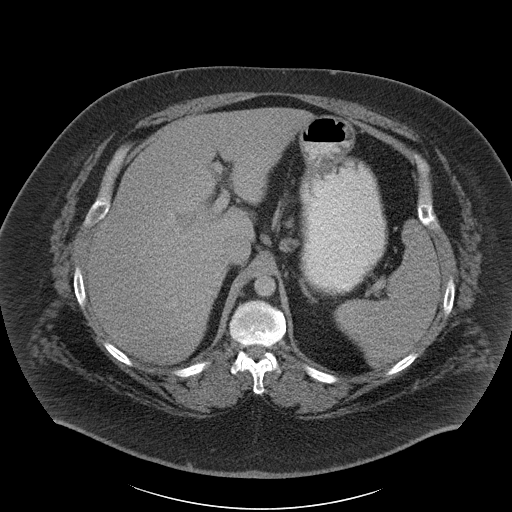
[im 81/103  soft-tissue]
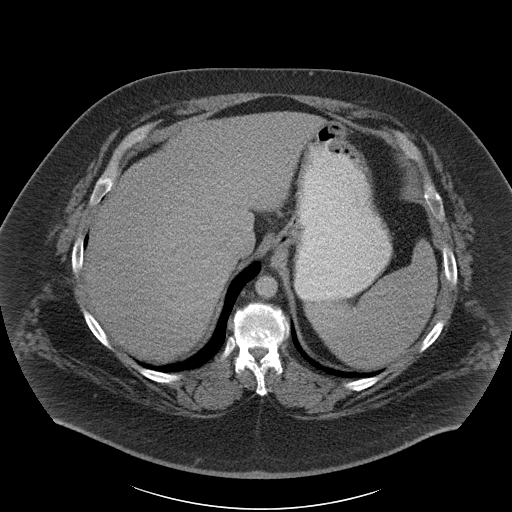
[im 92/103  soft-tissue]
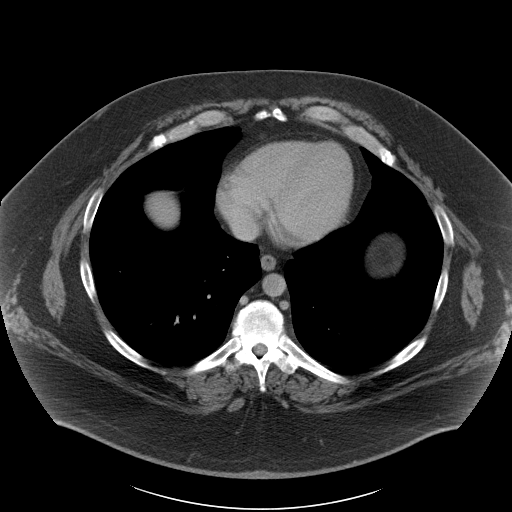
[im 97/103  soft-tissue]
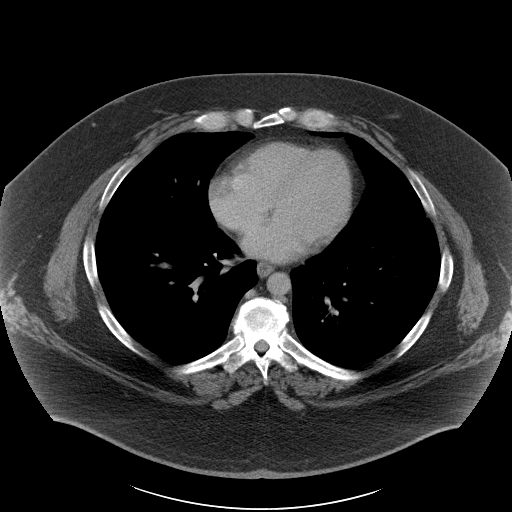

[Series 5: coronal a/|p · coronal · 0.97mm/px · 3 of 127 slices shown]
[im 43/127  soft-tissue]
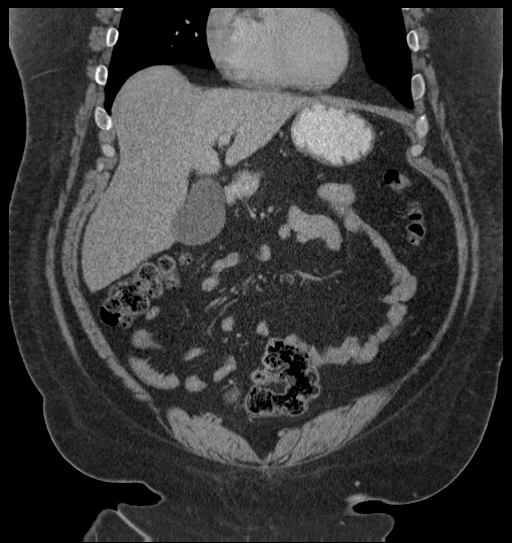
[im 57/127  soft-tissue]
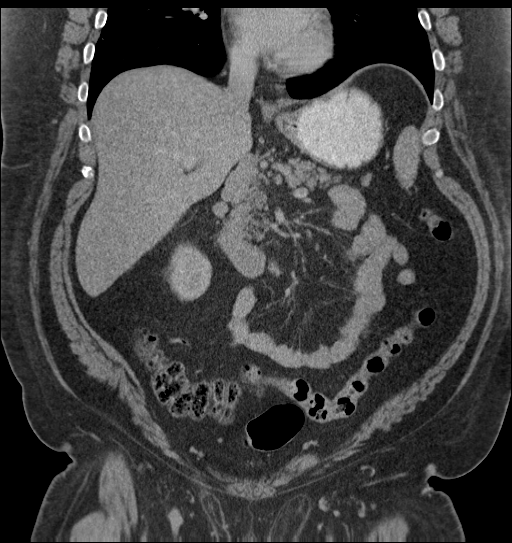
[im 71/127  soft-tissue]
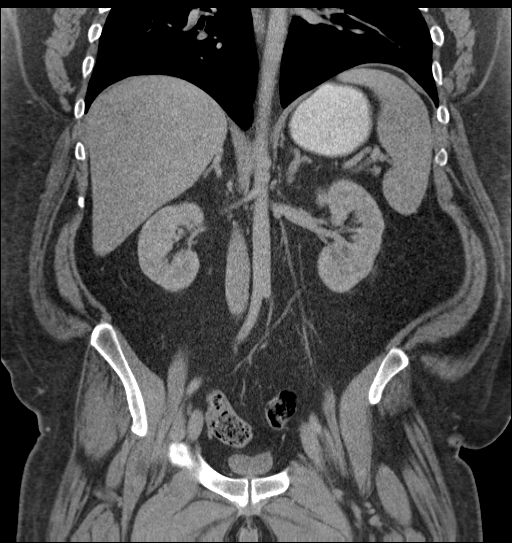

[17 of 46 positions shown; findings below may reference images not displayed]

FINDINGS: Lower chest: Multiple pulmonary nodules, the largest measuring
mm in the posterior right lower lobe based. This has been present
over multiple prior studies dating back to at least 09/18/2008.

Hepatobiliary: There are normal appearances of the liver,
gallbladder and bile ducts.

Pancreas: Normal

Spleen: Normal

Adrenals/Urinary Tract: The adrenals and kidneys are normal in
appearance. There is no urinary calculus evident. There is no
hydronephrosis or ureteral dilatation. Collecting systems and
ureters appear unremarkable.

Stomach/Bowel: There is a small hiatal hernia. The appendix is
normal. Small bowel and colon are unremarkable.

Vascular/Lymphatic: The abdominal aorta is normal in caliber. There
is no atherosclerotic calcification. There is no adenopathy in the
abdomen or pelvis.

Other: No acute inflammatory changes are evident in the abdomen or
pelvis. There is no ascites.

Musculoskeletal: Small fat containing umbilical hernia. No
significant skeletal lesions.
IMPRESSION: No acute findings are evident in the abdomen or pelvis. Stable
pulmonary nodules in the lung bases. Small hiatal hernia. Small fat
containing umbilical hernia.

## 2017-02-14 IMAGING — NM NM HEPATOBILIARY IMAGE, INC GB
1 series · 6 of 6 positions shown · non-contrast
Comparison: 05/17/15

CLINICAL DATA: Epigastric and right upper quadrant pain for three
years, getting worse over the past three months; known gall stones

EXAM:
NUCLEAR MEDICINE HEPATOBILIARY IMAGING
TECHNIQUE: Sequential images of the abdomen were obtained [DATE] minutes
following intravenous administration of radiopharmaceutical.
RADIOPHARMACEUTICALS:  5.5 mCi Uc-QQm  Choletec IV

[Series 1: biliary · 3.25mm/px · 6 of 37 frames shown]
[frame 4/37]
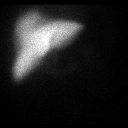
[frame 10/37]
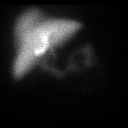
[frame 16/37]
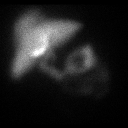
[frame 22/37]
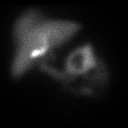
[frame 28/37]
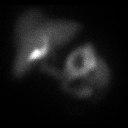
[frame 34/37]
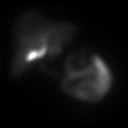

[6 of 6 positions shown; findings below may reference images not displayed]

FINDINGS: There is prompt hepatic cellular uptake. Small bowel uptake is seen
at 5 minutes. Gallbladder uptake is also seen at 5 minutes.
IMPRESSION: This study confirms that the common bile duct and cystic duct are
both patent.

## 2017-02-18 IMAGING — NM NM HEPATO W/GB/PHARM/[PERSON_NAME]
1 series · 12 of 12 positions shown · non-contrast
Comparison: 05/17/1968 as well as ultrasound 05/17/2015 and CT
05/16/2015

CLINICAL DATA: Intermittent right upper quadrant pain.

EXAM:
NUCLEAR MEDICINE HEPATOBILIARY IMAGING WITH GALLBLADDER EF
TECHNIQUE: Sequential images of the abdomen were obtained [DATE] minutes
following intravenous administration of radiopharmaceutical. After
slow intravenous infusion of 2.0 micrograms Cholecystokinin,
gallbladder ejection fraction was determined.
RADIOPHARMACEUTICALS:  5.4 mCi Gc-00m Choletec IV

[Series 1: hepato · 4.46mm/px · 2 acquisitions, 12 frames shown]
[im 1/2]
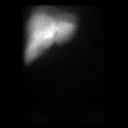
[im 1/2]
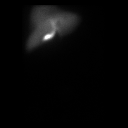
[im 1/2]
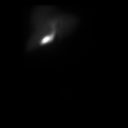
[im 1/2]
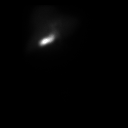
[im 1/2]
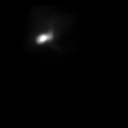
[im 1/2]
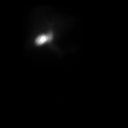
[im 2/2]
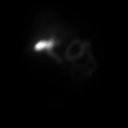
[im 2/2]
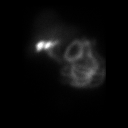
[im 2/2]
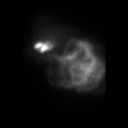
[im 2/2]
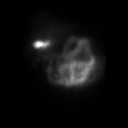
[im 2/2]
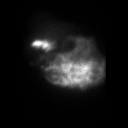
[im 2/2]
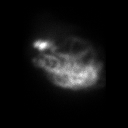

[12 of 12 positions shown; findings below may reference images not displayed]

FINDINGS: Exam demonstrates normal uniform uptake of radiotracer by the liver.
Gallbladder filling is present at 10-15 minutes. Biliary to bowel
transit is present less than 1 hour. At 60 min, normal ejection
fraction of 74% is calculated (Normal greater than 40%).
IMPRESSION: Normal hepatobiliary scan without evidence of acute cholecystitis.
Normal gallbladder ejection fraction of 74%.

## 2017-03-31 ENCOUNTER — Ambulatory Visit (HOSPITAL_COMMUNITY): Payer: Self-pay | Admitting: Psychology

## 2017-04-13 ENCOUNTER — Emergency Department (HOSPITAL_COMMUNITY)
Admission: EM | Admit: 2017-04-13 | Discharge: 2017-04-13 | Disposition: A | Discharge status: Home / Self Care | Payer: Medicare Other | Attending: Emergency Medicine | Admitting: Emergency Medicine

## 2017-04-13 ENCOUNTER — Emergency Department (HOSPITAL_COMMUNITY): Payer: Medicare Other

## 2017-04-13 ENCOUNTER — Encounter (HOSPITAL_COMMUNITY): Payer: Self-pay | Admitting: Emergency Medicine

## 2017-04-13 DIAGNOSIS — R079 Chest pain, unspecified: Secondary | ICD-10-CM | POA: Diagnosis present

## 2017-04-13 DIAGNOSIS — Z5321 Procedure and treatment not carried out due to patient leaving prior to being seen by health care provider: Secondary | ICD-10-CM | POA: Diagnosis not present

## 2017-04-13 IMAGING — CR DG KNEE COMPLETE 4+V*L*
4 series · 4 of 4 positions shown · non-contrast
Comparison: None.

CLINICAL DATA: Acute left knee pain after fall.  Initial encounter.

EXAM:
LEFT KNEE - COMPLETE 4+ VIEW

[t knee ap left]
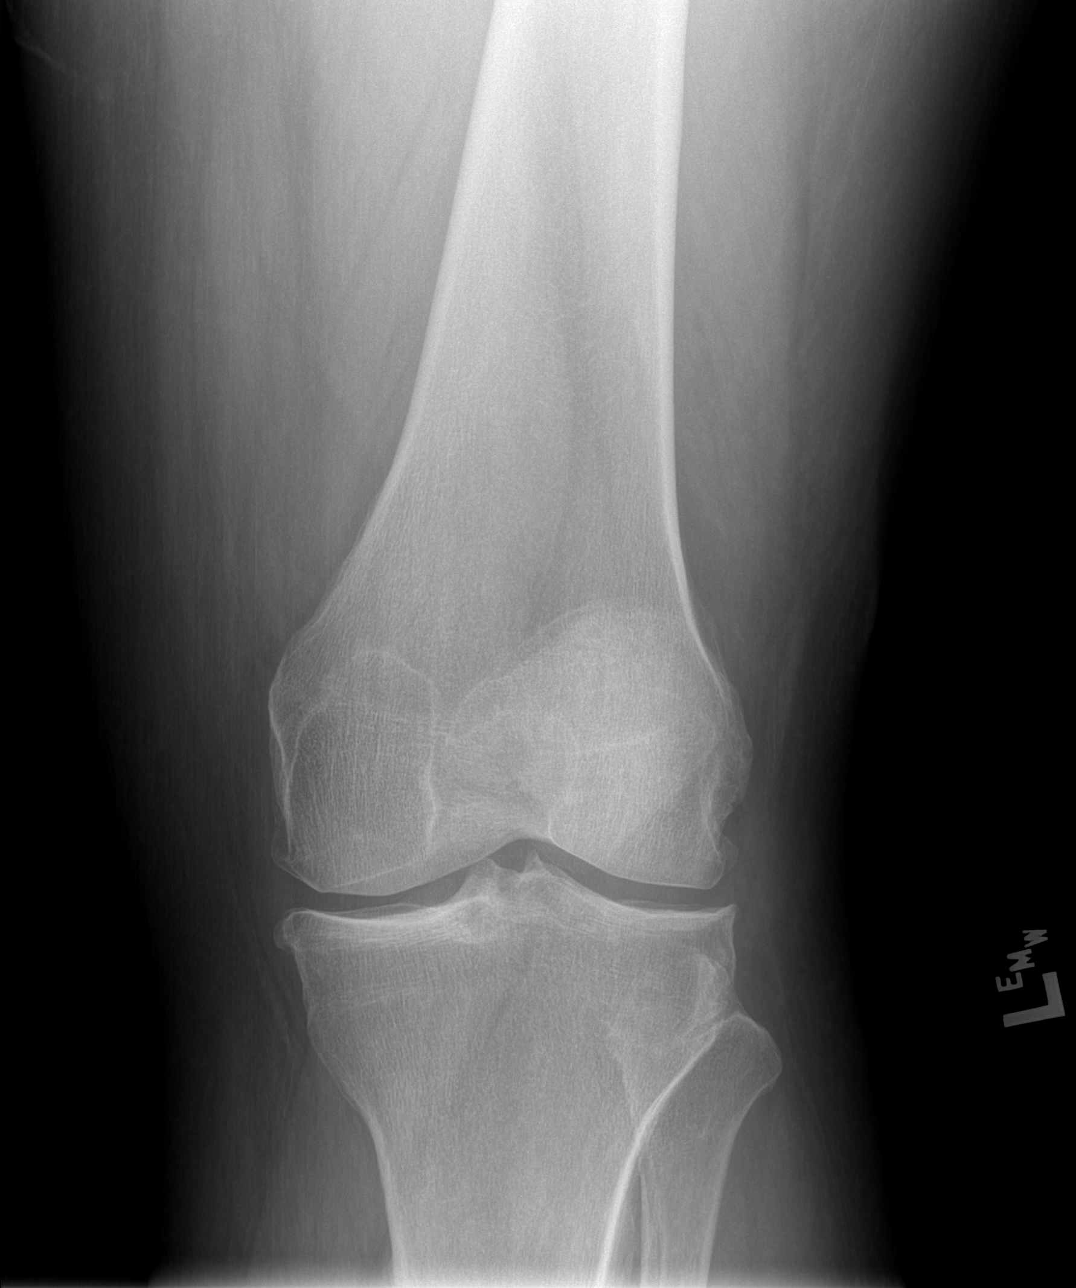

[t knee oblique left (1 of 2)]
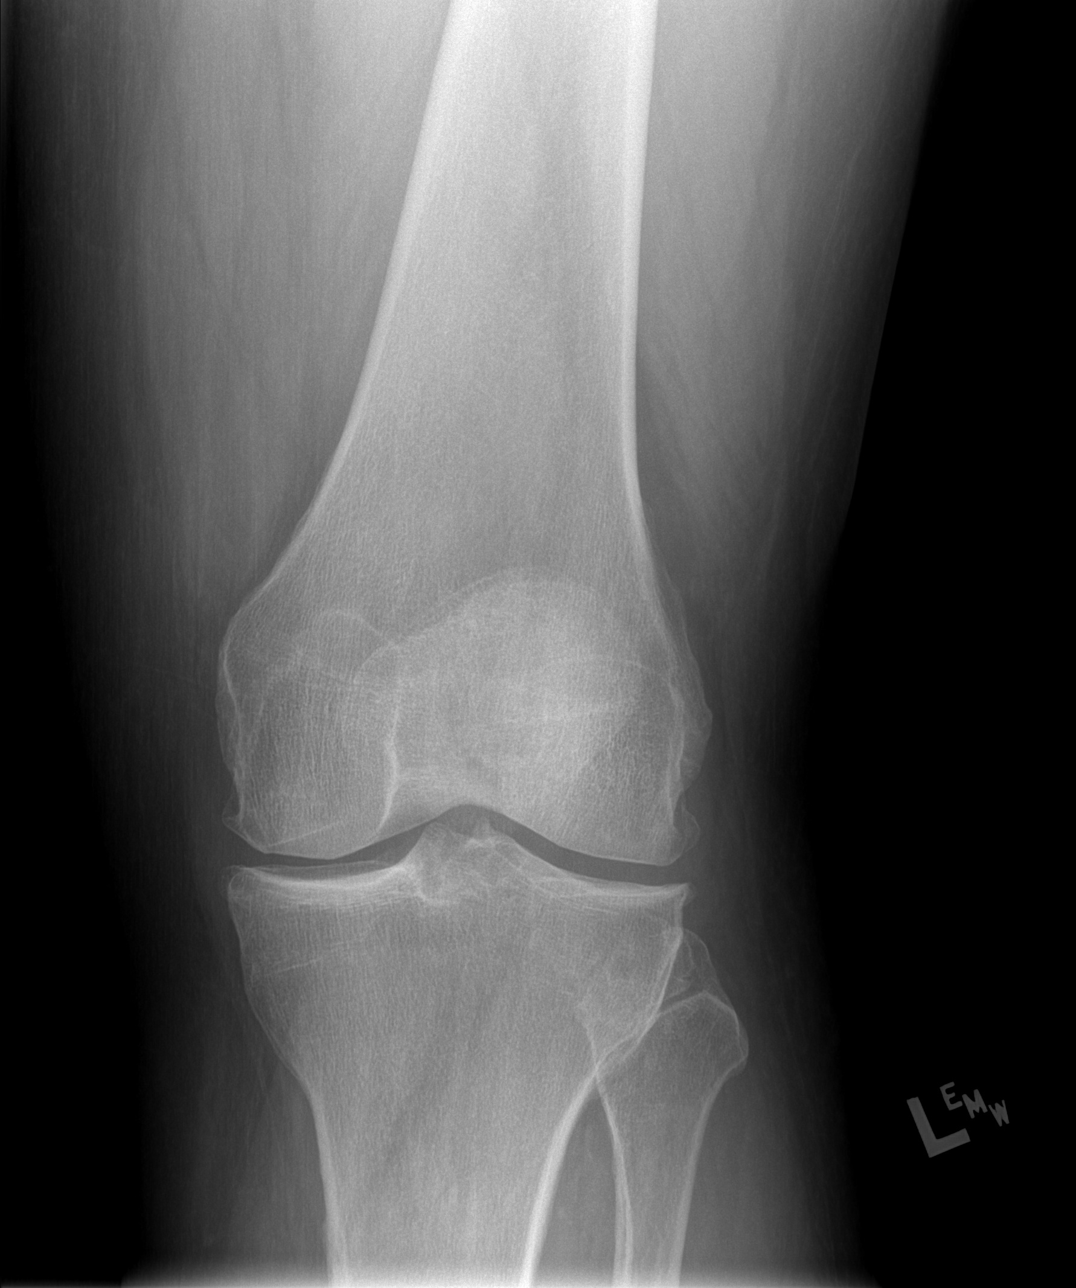

[t knee oblique left (2 of 2)]
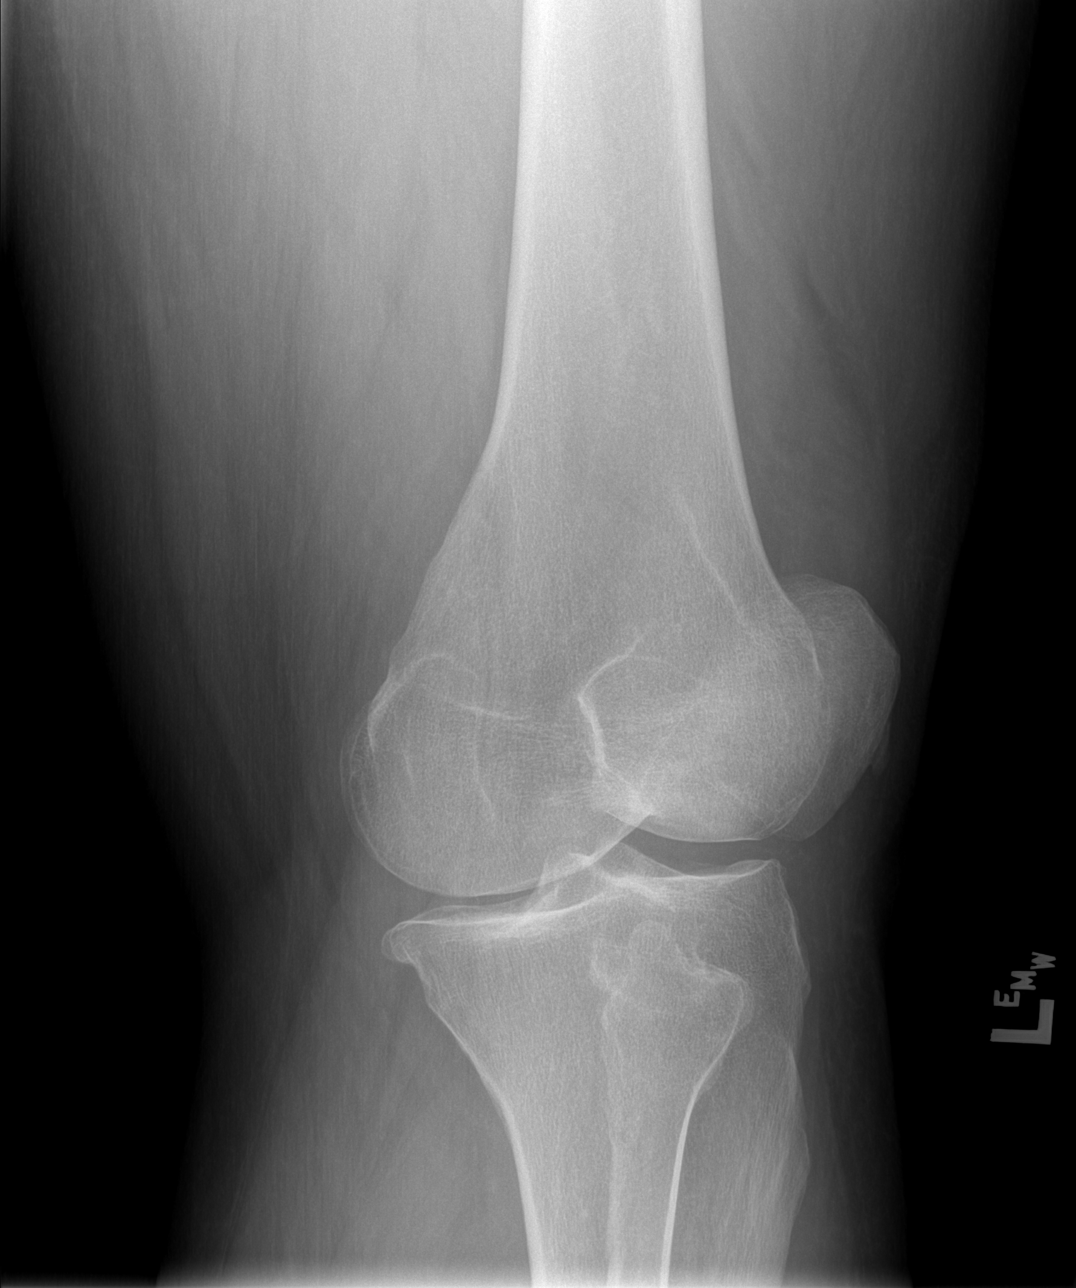

[t knee lat left]
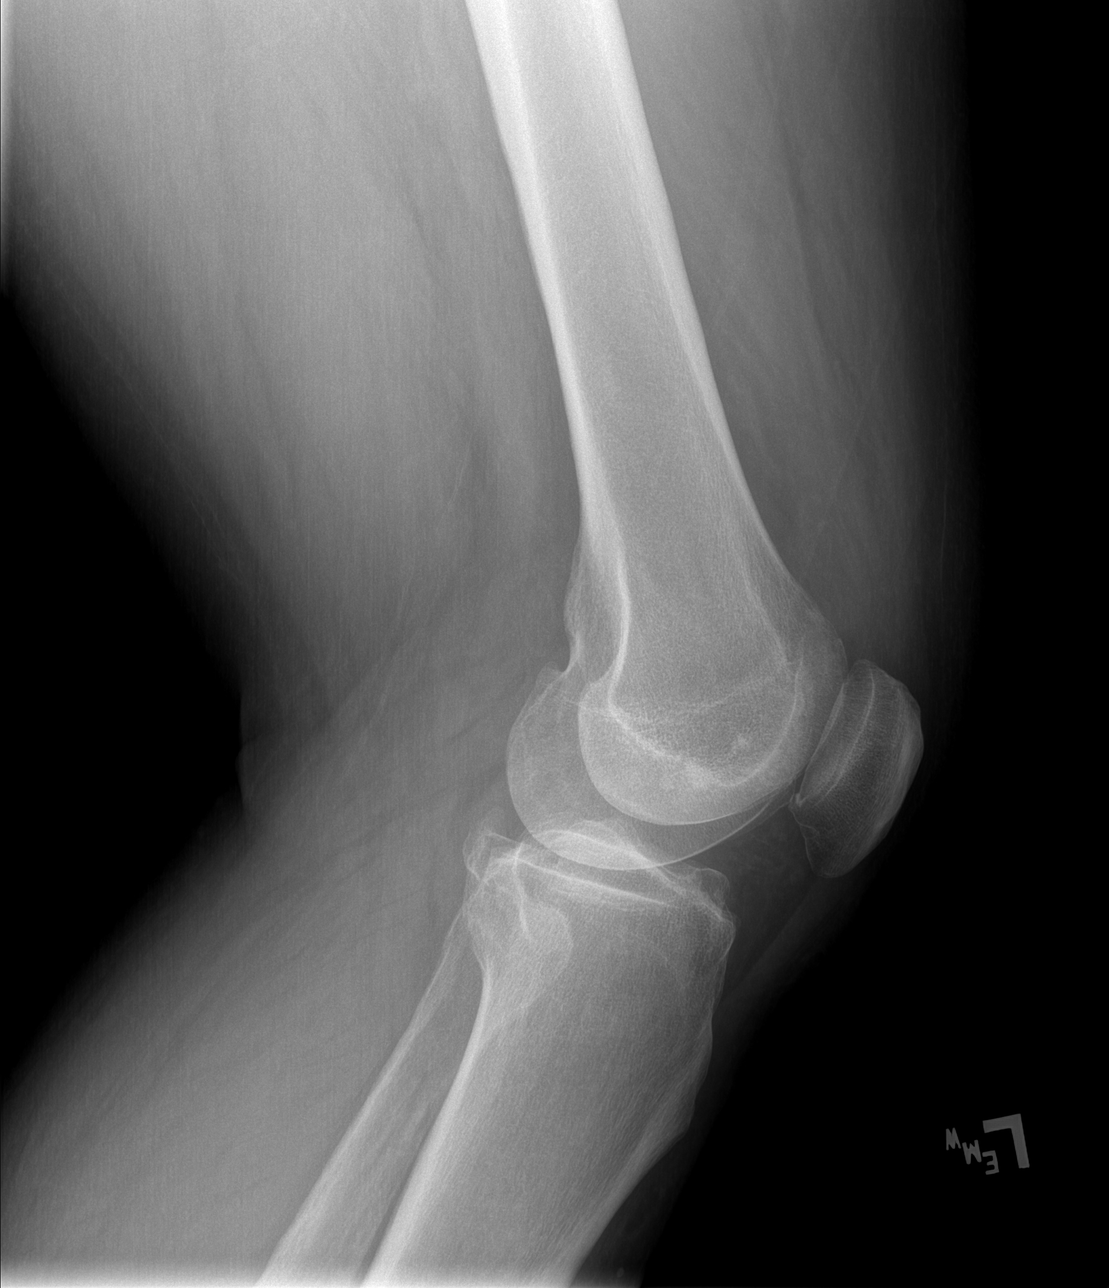

[4 of 4 positions shown; findings below may reference images not displayed]

FINDINGS: There is no evidence of fracture, dislocation, or joint effusion.
Mild narrowing and osteophyte formation is seen involving the medial
and lateral joint spaces. Soft tissues are unremarkable.
IMPRESSION: Mild degenerative joint disease. No acute abnormality seen in the
left knee.

## 2017-04-13 IMAGING — CR DG SHOULDER 2+V*L*
4 series · 4 of 4 positions shown · non-contrast
Comparison: None.

CLINICAL DATA: Hit door jam with left foot resulting in fall.

EXAM:
LEFT SHOULDER - 2+ VIEW

[t shoulder ap internal left]
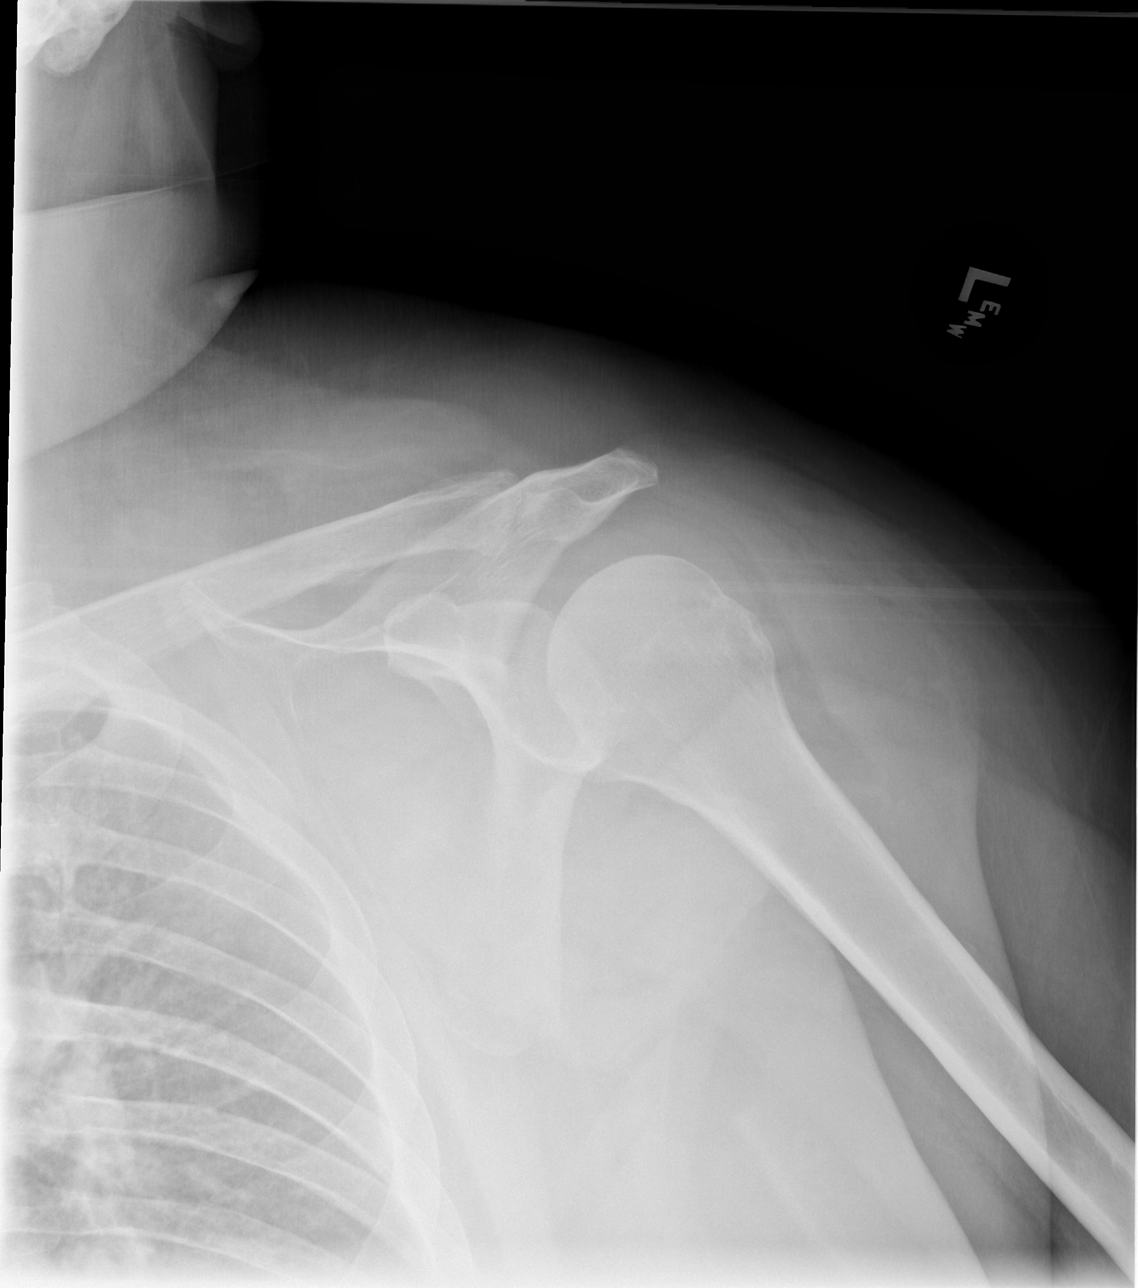

[t shoulder ap external left]
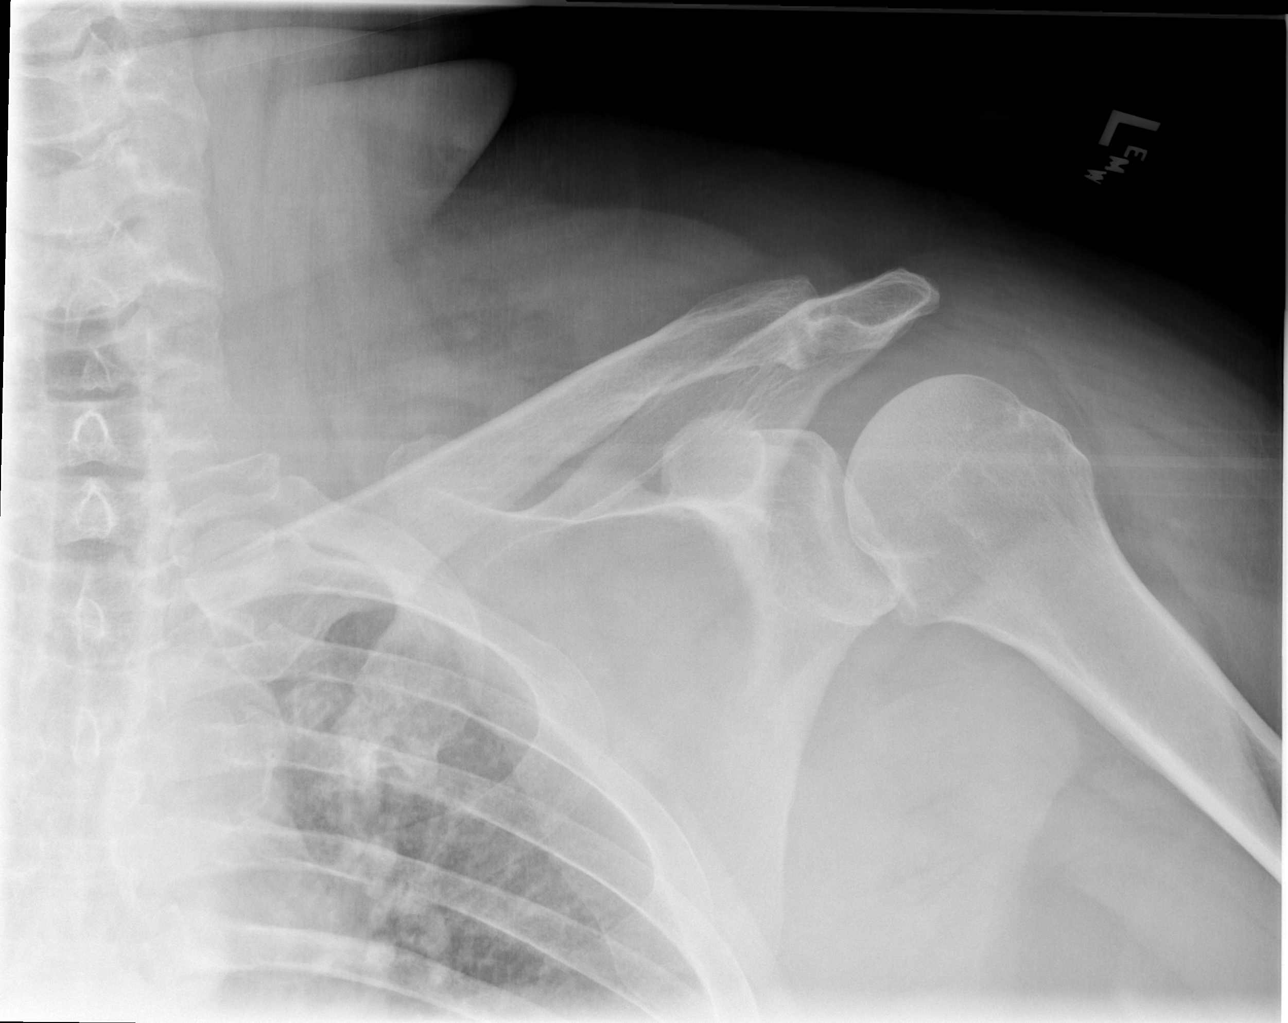

[t shoulder y view left (1 of 2)]
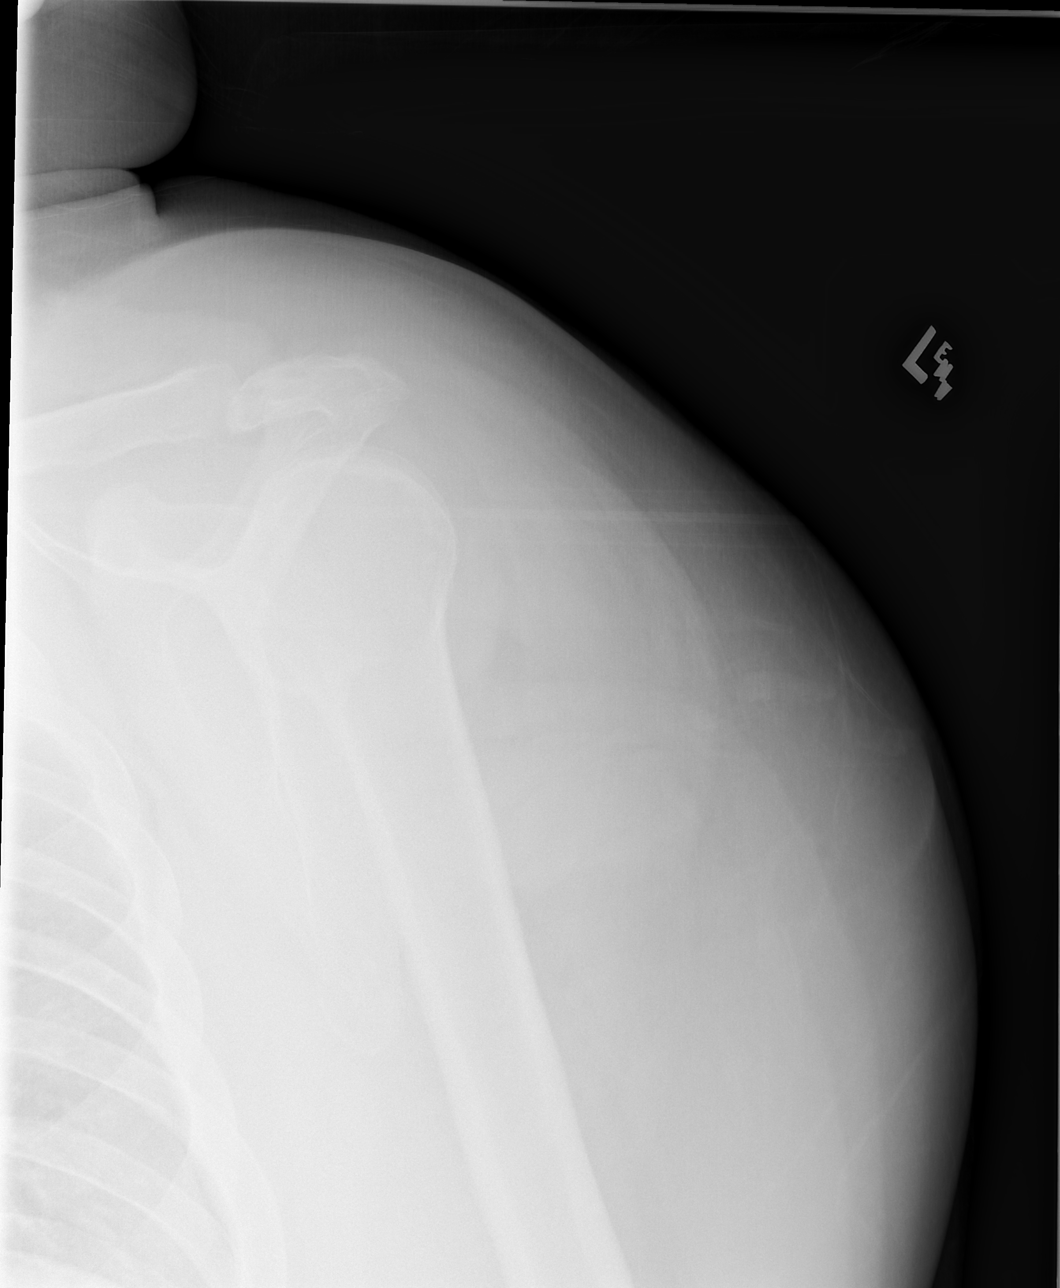

[t shoulder y view left (2 of 2)]
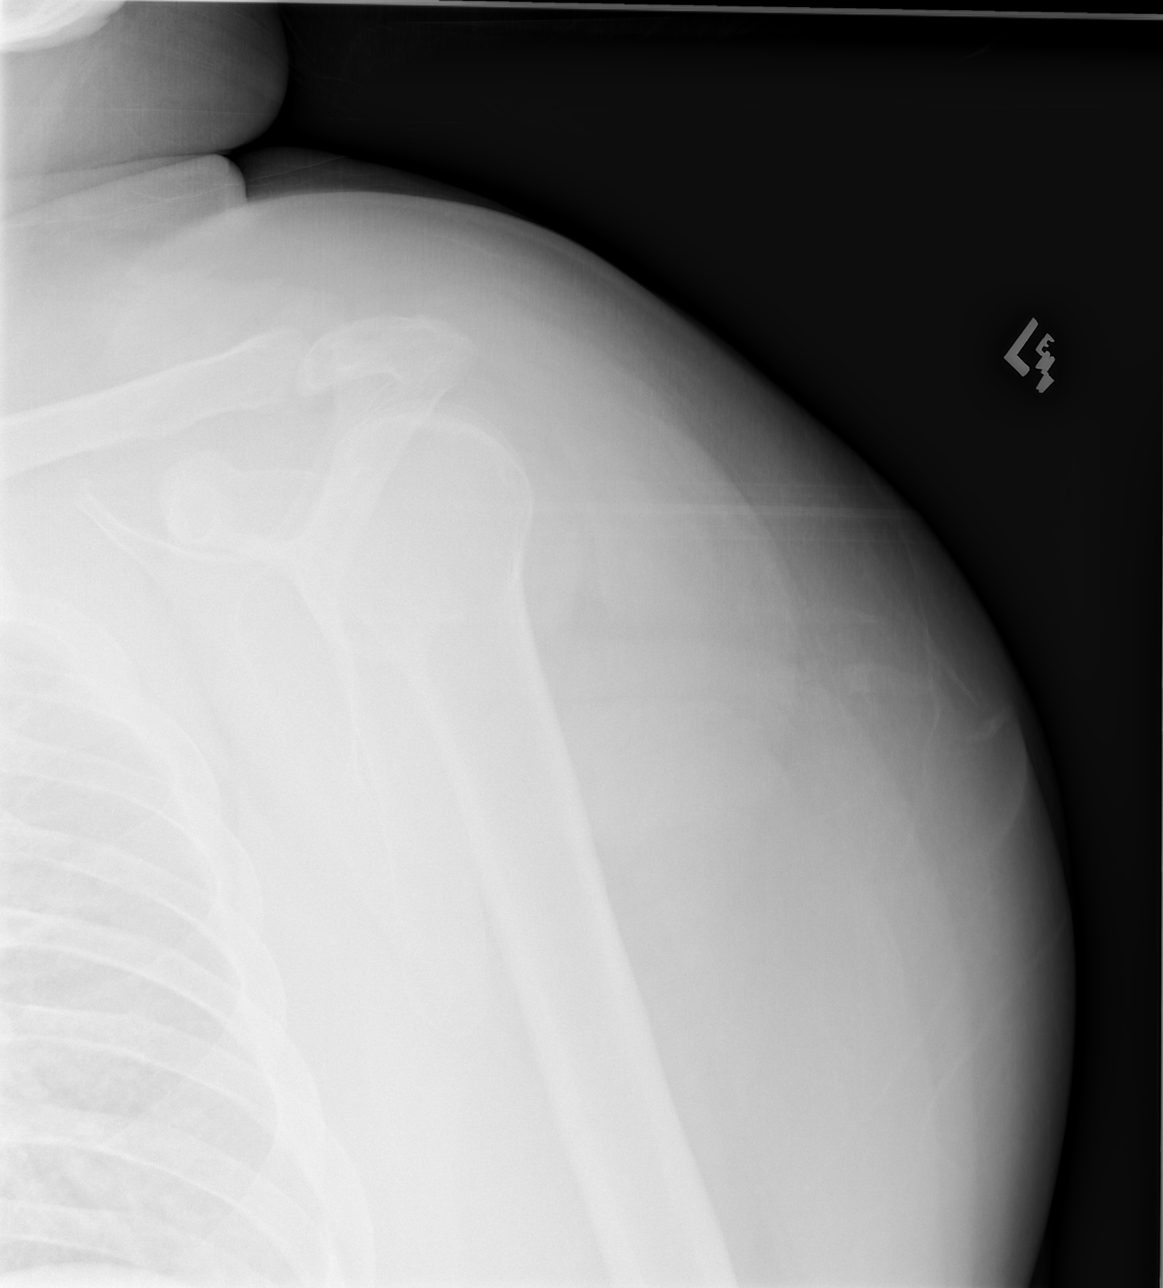

[4 of 4 positions shown; findings below may reference images not displayed]

FINDINGS: Apparent posterior dislocation of the glenohumeral joint. No
fractures identified. Degenerative changes are noted at the
acromioclavicular joint.
IMPRESSION: 1. Posterior shoulder dislocation.

## 2017-04-13 IMAGING — CT CT SHOULDER*L* W/O CM
2 of 4 series · 6 of 18 positions shown, 7 images · non-contrast
Comparison: Plain films left shoulder this same day.

CLINICAL DATA: Status post fall today with a left shoulder injury.
Pain. Question dislocation. Initial encounter.

EXAM:
CT OF THE LEFT SHOULDER WITHOUT CONTRAST
TECHNIQUE: Multidetector CT imaging was performed according to the standard
protocol. Multiplanar CT image reconstructions were also generated.

[Series 5: ax st · oblique · 0.46mm/px · 3 of 131 slices shown, 4 images]
[im 1/131  soft-tissue]
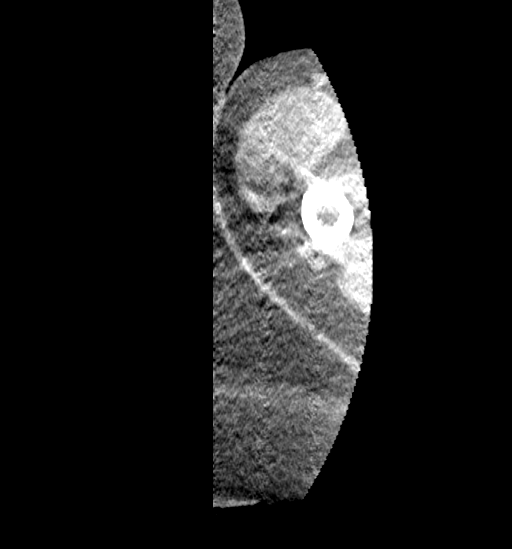
[im 1/131  bone]
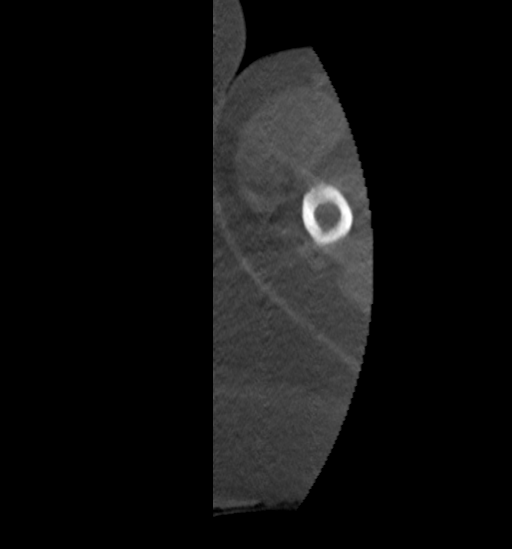
[im 66/131  bone]
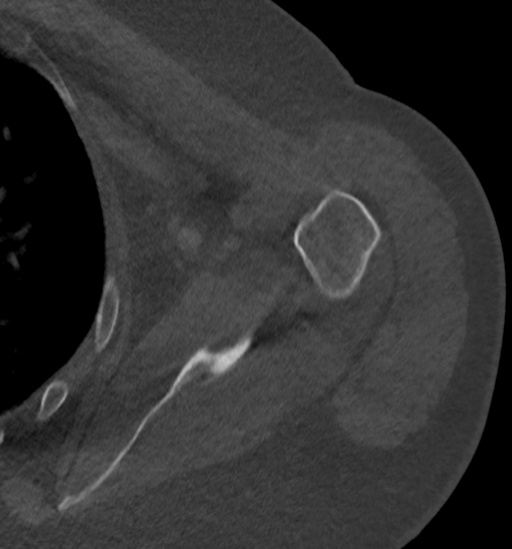
[im 131/131  bone]
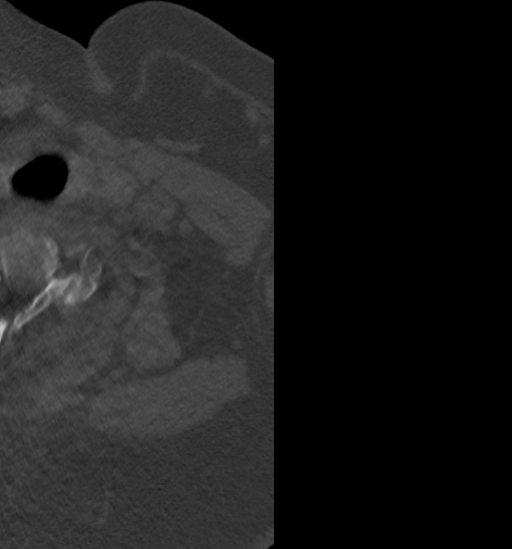

[Series 8: cor st · coronal · 0.41mm/px · 3 of 101 slices shown]
[im 41/101  bone]
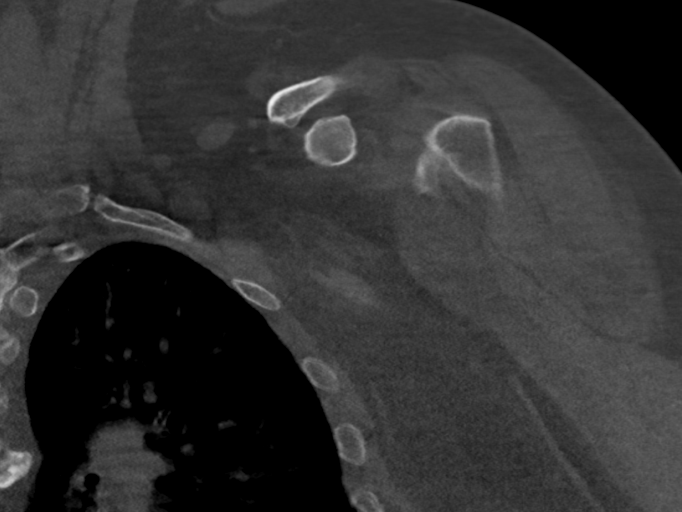
[im 51/101  bone]
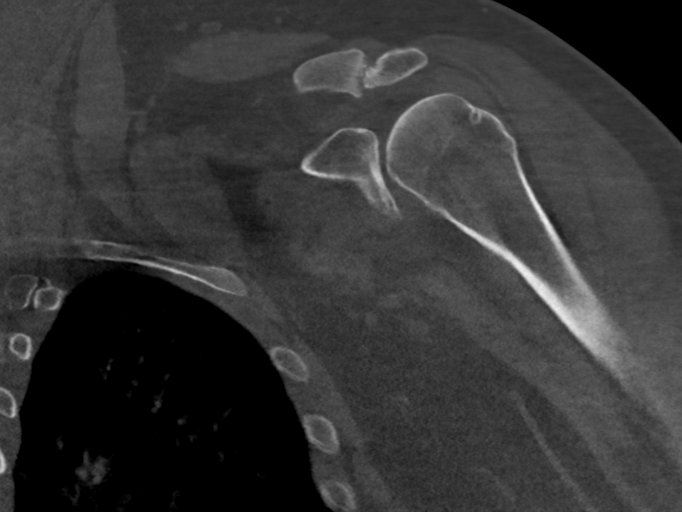
[im 61/101  bone]
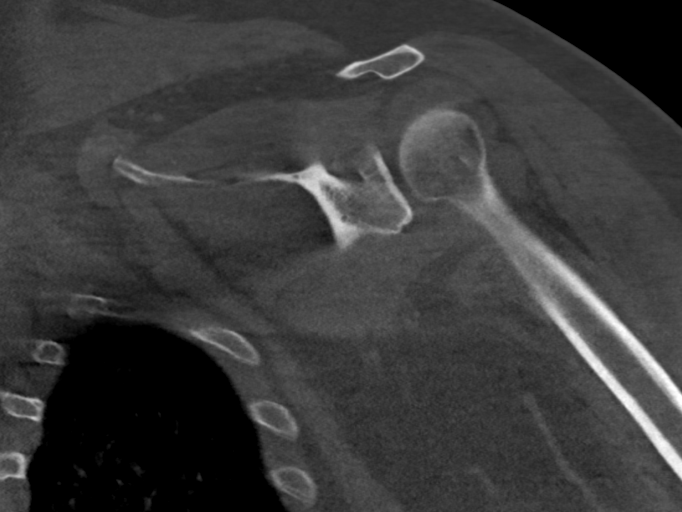

[6 of 18 positions shown; findings below may reference images not displayed]

FINDINGS: The shoulder is internally rotated but located. There is no
posterior dislocation. No fracture is identified. Moderate
acromioclavicular degenerative change is seen. No joint effusion is
identified. The rotator cuff appears intact imaged lung parenchyma
is clear.
IMPRESSION: Negative for shoulder dislocation. No acute abnormality. Internal
rotation of the humeral head is noted.

Acromioclavicular osteoarthritis.

## 2017-04-13 IMAGING — CR DG CLAVICLE*L*
2 series · 2 of 2 positions shown · non-contrast
Comparison: None.

CLINICAL DATA: Left shoulder injury after fall.

EXAM:
LEFT CLAVICLE - 2+ VIEWS

[t clavicle ap left]
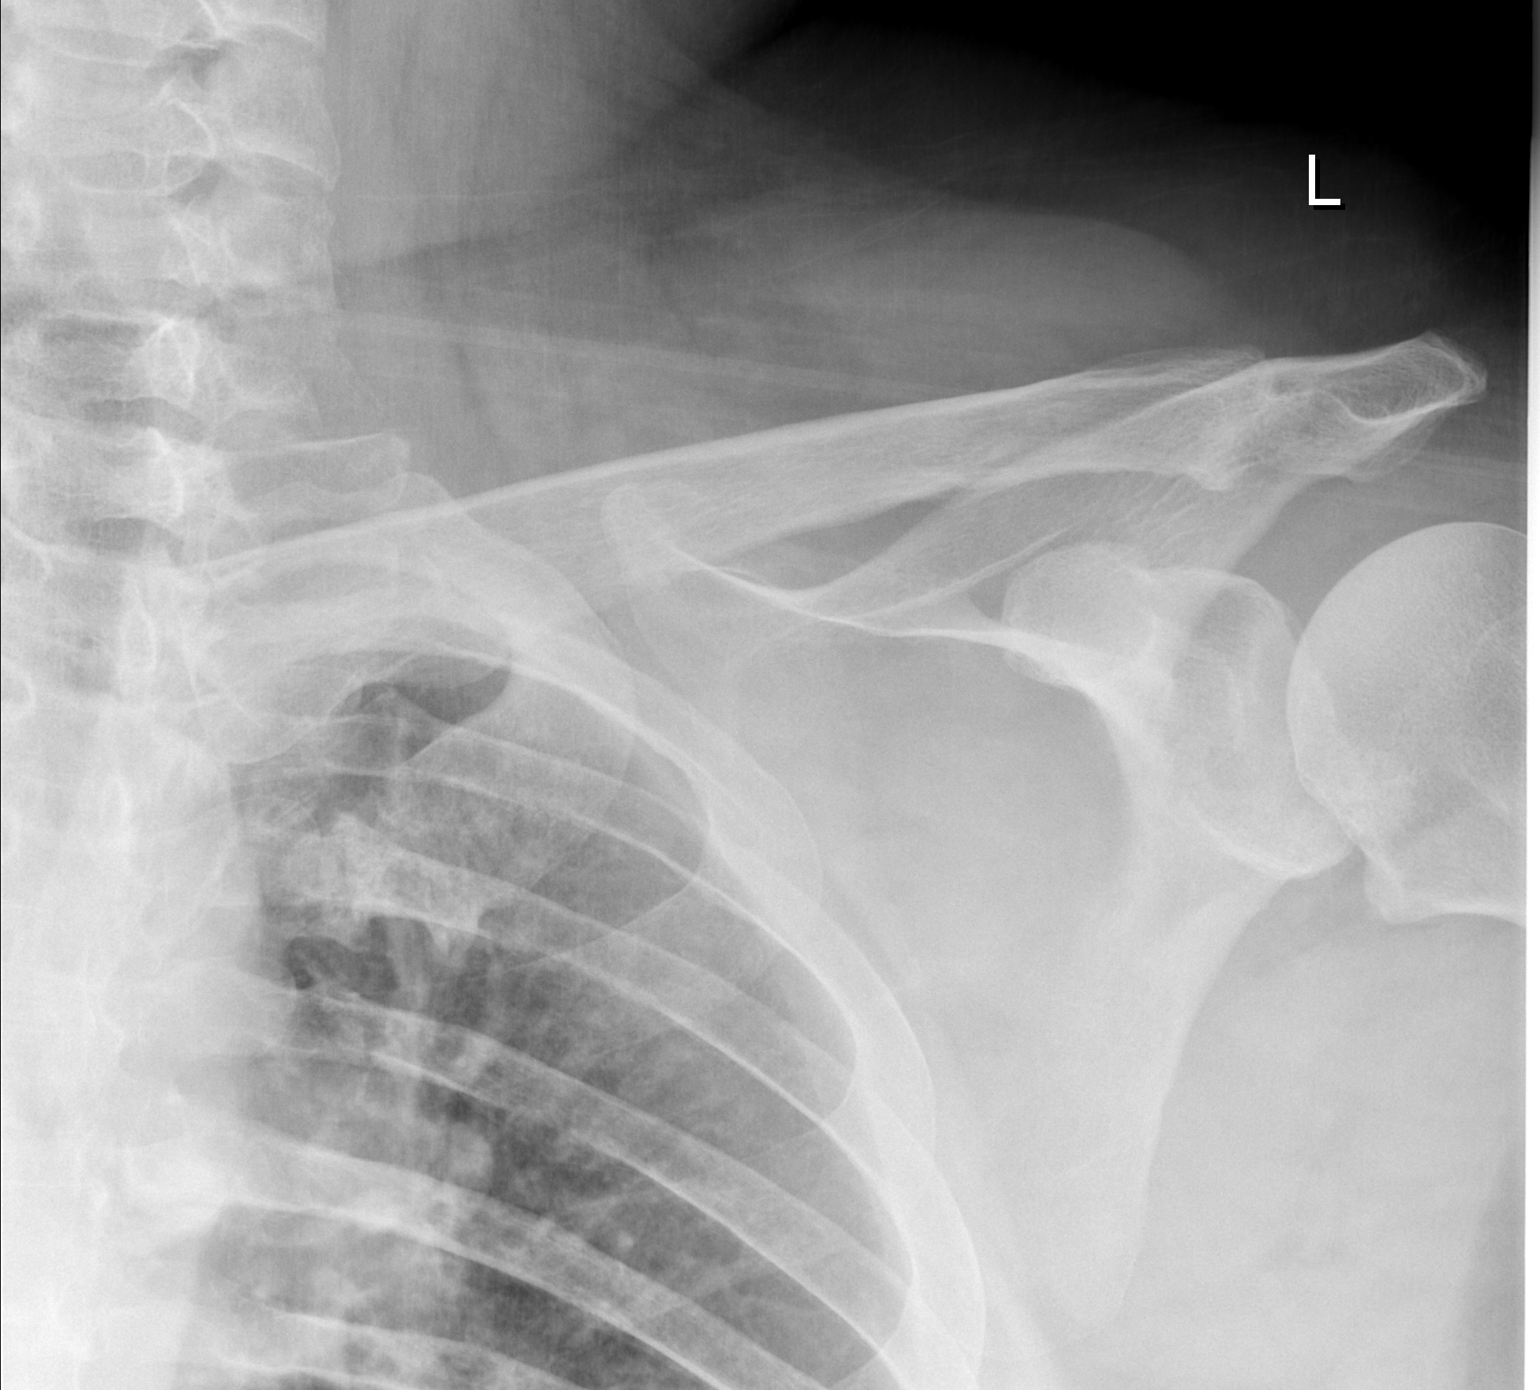

[t clavicle tangential left]
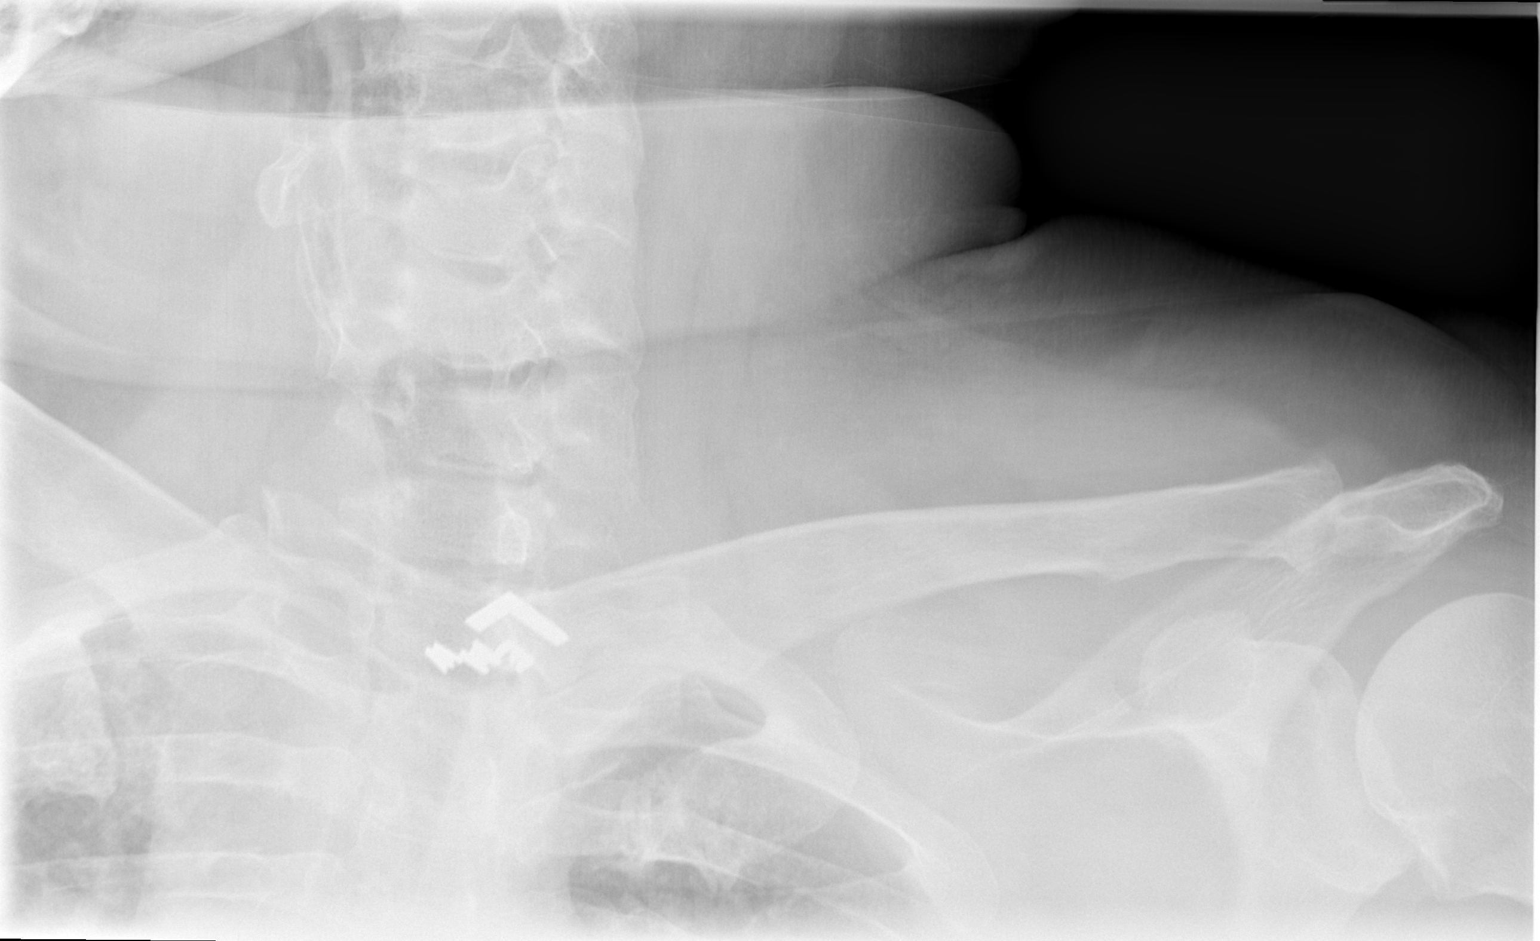

[2 of 2 positions shown; findings below may reference images not displayed]

FINDINGS: There is no evidence of fracture or other focal bone lesions. Soft
tissues are unremarkable.
IMPRESSION: Normal left clavicle.

## 2017-04-13 IMAGING — CR DG ANKLE COMPLETE 3+V*L*
3 series · 3 of 3 positions shown · non-contrast
Comparison: None.

CLINICAL DATA: Acute left ankle pain after fall. Initial encounter.

EXAM:
LEFT ANKLE COMPLETE - 3+ VIEW

[t ankle joint lat left]
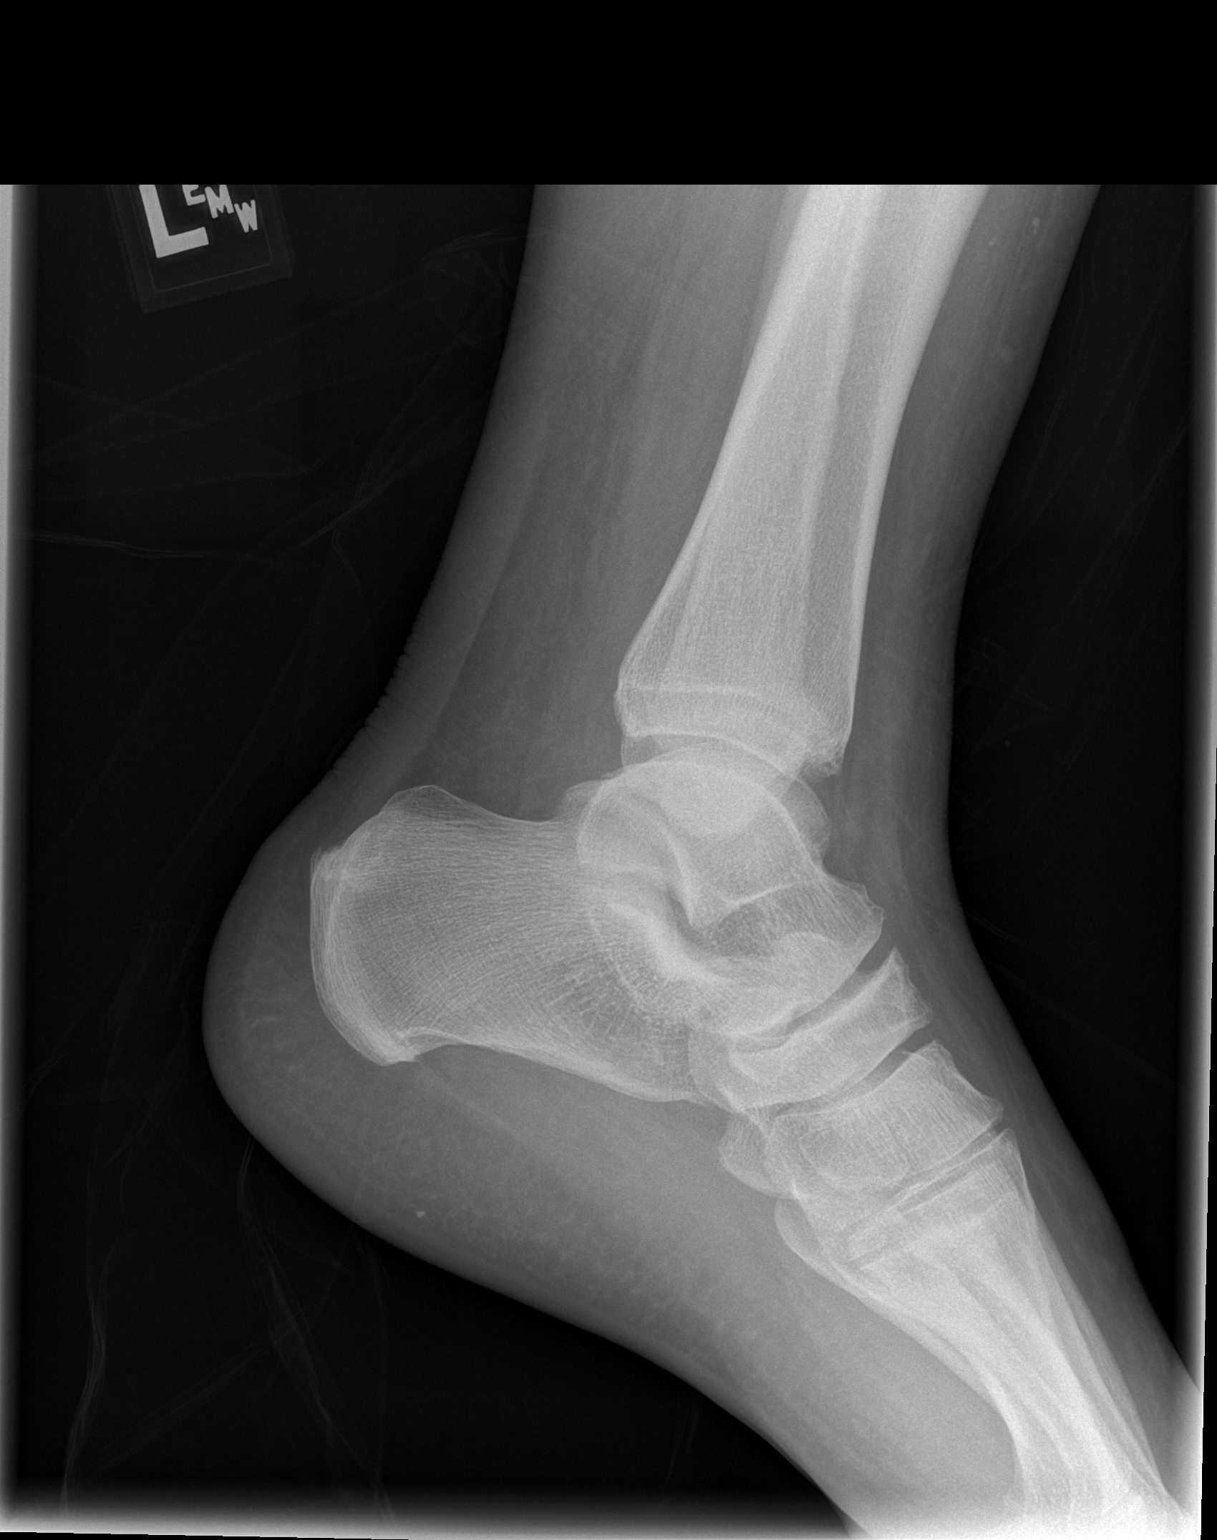

[t ankle joint ap left]
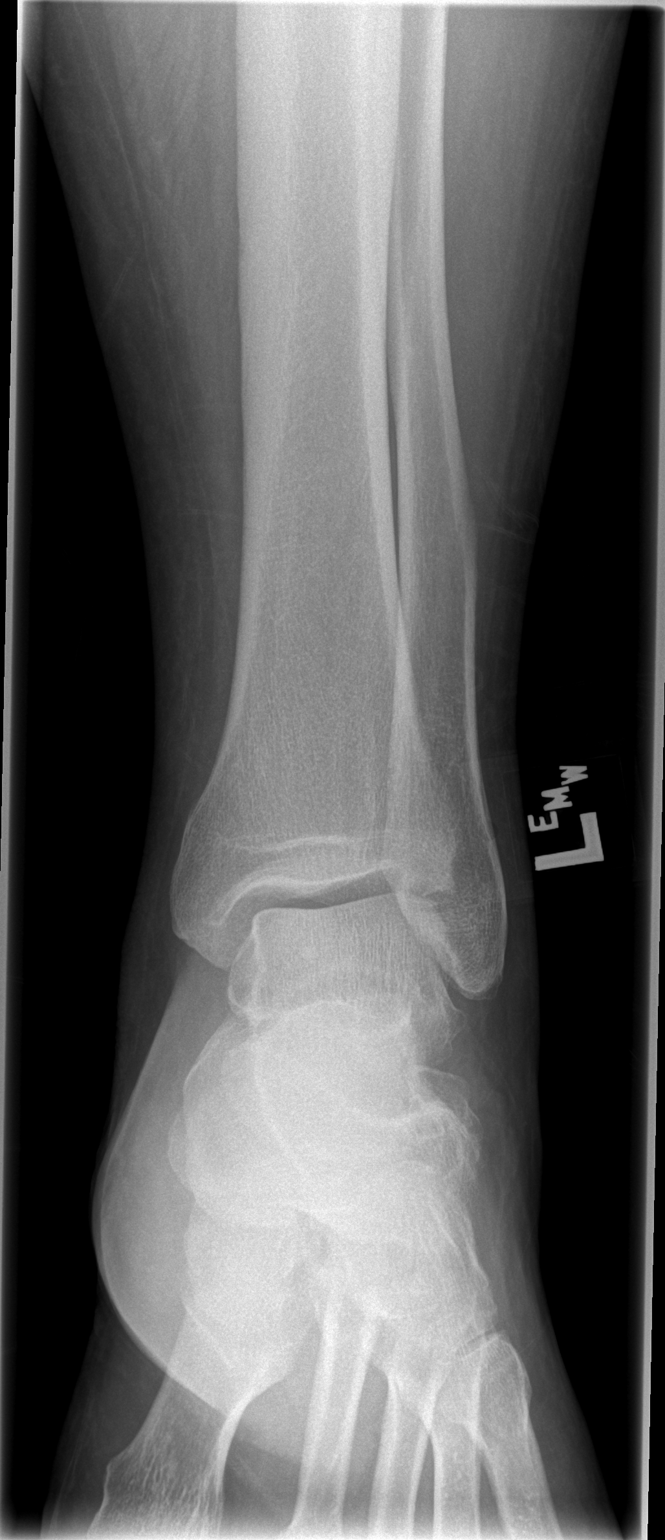

[t ankle joint oblique left]
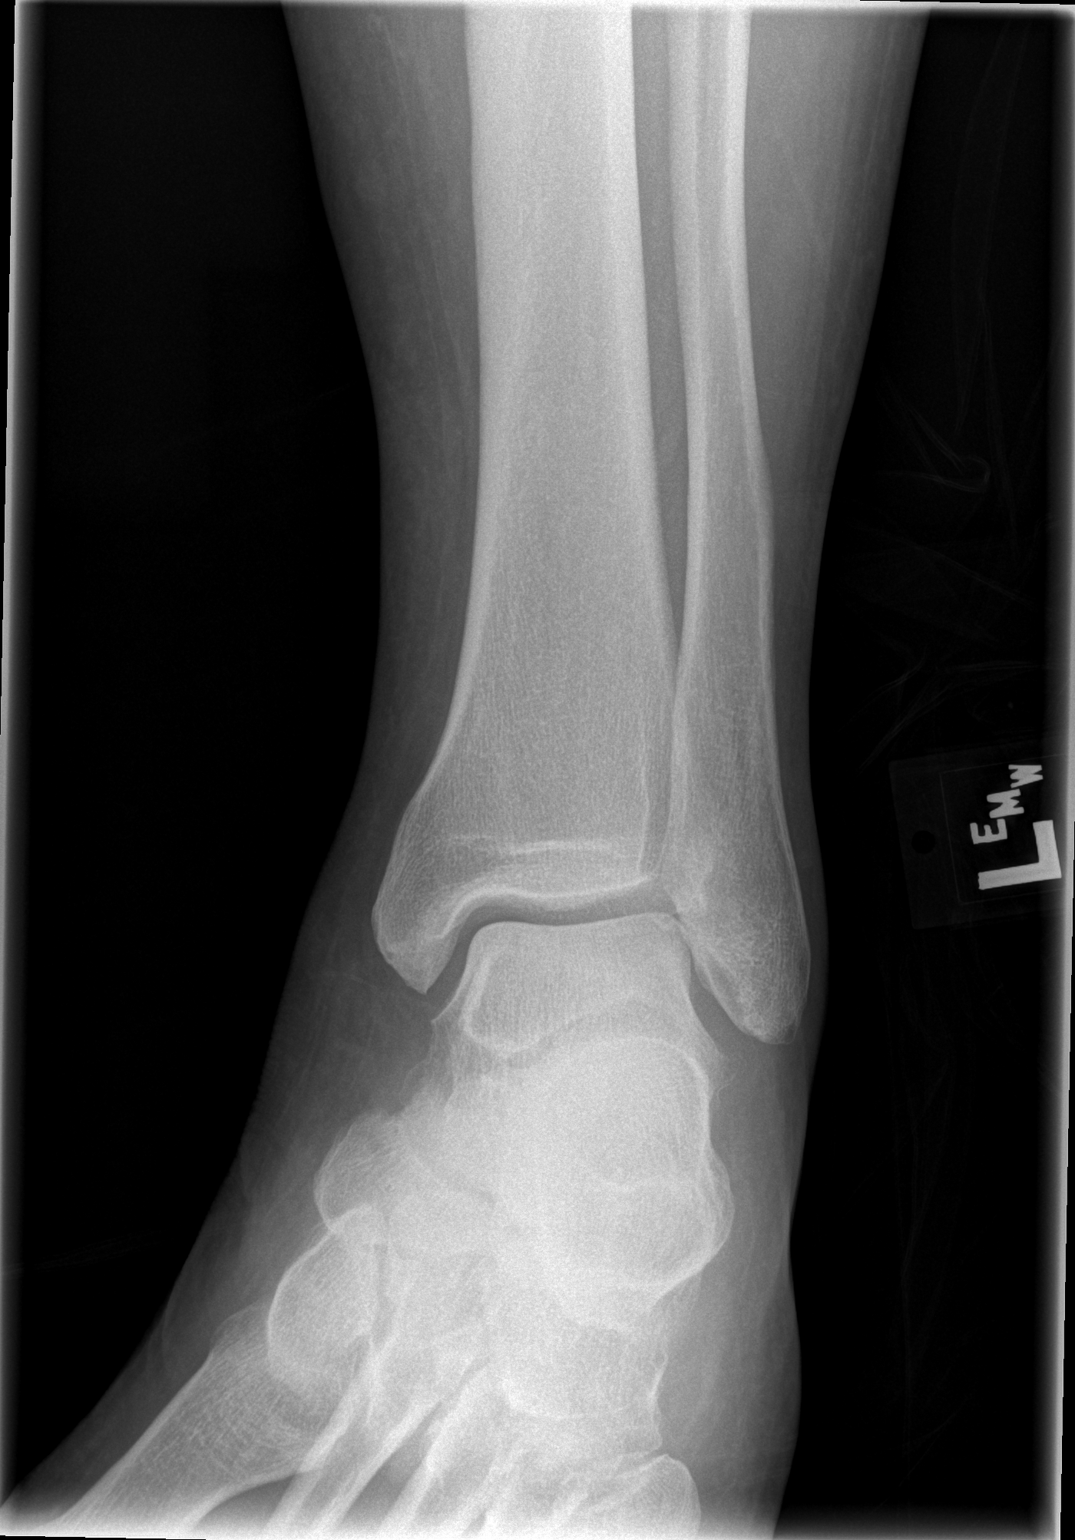

[3 of 3 positions shown; findings below may reference images not displayed]

FINDINGS: There is no evidence of fracture, dislocation, or joint effusion.
There is no evidence of arthropathy or other focal bone abnormality.
Soft tissues are unremarkable.
IMPRESSION: Normal left ankle.

## 2017-04-13 NOTE — ED Notes (Signed)
Called x1 for room 

## 2017-04-13 NOTE — ED Notes (Signed)
Last call. No answer from lobby,

## 2017-04-13 NOTE — ED Notes (Signed)
Unable to collect labs patient refused I informed patient how important it was to collect labs on a patient with chest pain and patient still refused stating he wanted his labs collect when he gets his IV

## 2017-04-13 NOTE — ED Triage Notes (Signed)
Pt called in ED lobby for vitals assessment x1, no answer. [employer]

## 2017-04-13 NOTE — ED Notes (Signed)
Called patient and no answer.

## 2017-04-13 NOTE — ED Notes (Signed)
Pt offered an ultrasound blood draw. He states that he wants to delay blood draw until he can get an IV in the back. Pt verbalized understanding about what labs we draw for chest pain and why it is important that we get them quickly. He still declined.

## 2017-04-13 NOTE — ED Triage Notes (Signed)
Patient presents ambulatory stating he is having right sided chest pain onset of about an hour and a half ago. Describes it as stabbing on the right side. C/o bilateral arm burning feeling. Reports having stroke last month, only deficit is right eye droop.

## 2017-04-30 ENCOUNTER — Inpatient Hospital Stay (HOSPITAL_COMMUNITY)
Admission: EM | Admit: 2017-04-30 | Discharge: 2017-05-04 | DRG: 478 | Disposition: A | Discharge status: Home Health | Payer: Medicare Other | Attending: Oncology | Admitting: Oncology

## 2017-04-30 ENCOUNTER — Encounter (HOSPITAL_COMMUNITY): Payer: Self-pay | Admitting: Emergency Medicine

## 2017-04-30 DIAGNOSIS — Z8249 Family history of ischemic heart disease and other diseases of the circulatory system: Secondary | ICD-10-CM

## 2017-04-30 DIAGNOSIS — E1169 Type 2 diabetes mellitus with other specified complication: Secondary | ICD-10-CM | POA: Diagnosis present

## 2017-04-30 DIAGNOSIS — J45909 Unspecified asthma, uncomplicated: Secondary | ICD-10-CM | POA: Diagnosis present

## 2017-04-30 DIAGNOSIS — M545 Low back pain, unspecified: Secondary | ICD-10-CM

## 2017-04-30 DIAGNOSIS — E876 Hypokalemia: Secondary | ICD-10-CM | POA: Diagnosis present

## 2017-04-30 DIAGNOSIS — F1190 Opioid use, unspecified, uncomplicated: Secondary | ICD-10-CM | POA: Diagnosis present

## 2017-04-30 DIAGNOSIS — F17200 Nicotine dependence, unspecified, uncomplicated: Secondary | ICD-10-CM | POA: Diagnosis present

## 2017-04-30 DIAGNOSIS — F319 Bipolar disorder, unspecified: Secondary | ICD-10-CM

## 2017-04-30 DIAGNOSIS — F17210 Nicotine dependence, cigarettes, uncomplicated: Secondary | ICD-10-CM | POA: Diagnosis present

## 2017-04-30 DIAGNOSIS — M4620 Osteomyelitis of vertebra, site unspecified: Secondary | ICD-10-CM | POA: Diagnosis present

## 2017-04-30 DIAGNOSIS — E785 Hyperlipidemia, unspecified: Secondary | ICD-10-CM | POA: Diagnosis present

## 2017-04-30 DIAGNOSIS — F329 Major depressive disorder, single episode, unspecified: Secondary | ICD-10-CM

## 2017-04-30 DIAGNOSIS — I110 Hypertensive heart disease with heart failure: Secondary | ICD-10-CM | POA: Diagnosis present

## 2017-04-30 DIAGNOSIS — Z79899 Other long term (current) drug therapy: Secondary | ICD-10-CM

## 2017-04-30 DIAGNOSIS — M4624 Osteomyelitis of vertebra, thoracic region: Principal | ICD-10-CM | POA: Diagnosis present

## 2017-04-30 DIAGNOSIS — G894 Chronic pain syndrome: Secondary | ICD-10-CM | POA: Diagnosis present

## 2017-04-30 DIAGNOSIS — M5440 Lumbago with sciatica, unspecified side: Secondary | ICD-10-CM | POA: Diagnosis present

## 2017-04-30 DIAGNOSIS — F1199 Opioid use, unspecified with unspecified opioid-induced disorder: Secondary | ICD-10-CM | POA: Diagnosis present

## 2017-04-30 DIAGNOSIS — Z8673 Personal history of transient ischemic attack (TIA), and cerebral infarction without residual deficits: Secondary | ICD-10-CM

## 2017-04-30 DIAGNOSIS — I5032 Chronic diastolic (congestive) heart failure: Secondary | ICD-10-CM | POA: Diagnosis present

## 2017-04-30 DIAGNOSIS — Z886 Allergy status to analgesic agent status: Secondary | ICD-10-CM

## 2017-04-30 DIAGNOSIS — B182 Chronic viral hepatitis C: Secondary | ICD-10-CM | POA: Diagnosis present

## 2017-04-30 DIAGNOSIS — F909 Attention-deficit hyperactivity disorder, unspecified type: Secondary | ICD-10-CM | POA: Diagnosis present

## 2017-04-30 DIAGNOSIS — Z6841 Body Mass Index (BMI) 40.0 and over, adult: Secondary | ICD-10-CM

## 2017-04-30 DIAGNOSIS — G4733 Obstructive sleep apnea (adult) (pediatric): Secondary | ICD-10-CM | POA: Diagnosis present

## 2017-04-30 DIAGNOSIS — E1142 Type 2 diabetes mellitus with diabetic polyneuropathy: Secondary | ICD-10-CM | POA: Diagnosis present

## 2017-04-30 LAB — CK: Total CK: 71 U/L (ref 49–397)

## 2017-04-30 LAB — COMPREHENSIVE METABOLIC PANEL
ALBUMIN: 3.5 g/dL (ref 3.5–5.0)
ALK PHOS: 82 U/L (ref 38–126)
ALT: 77 U/L — AB (ref 17–63)
AST: 58 U/L — AB (ref 15–41)
Anion gap: 7 (ref 5–15)
BILIRUBIN TOTAL: 0.8 mg/dL (ref 0.3–1.2)
BUN: 12 mg/dL (ref 6–20)
CALCIUM: 8.8 mg/dL — AB (ref 8.9–10.3)
CO2: 29 mmol/L (ref 22–32)
CREATININE: 0.74 mg/dL (ref 0.61–1.24)
Chloride: 100 mmol/L — ABNORMAL LOW (ref 101–111)
GFR calc Af Amer: >60 mL/min (ref >60)
GLUCOSE: 137 mg/dL — AB (ref 65–99)
Potassium: 4.2 mmol/L (ref 3.5–5.1)
Sodium: 136 mmol/L (ref 135–145)
TOTAL PROTEIN: 7.8 g/dL (ref 6.5–8.1)

## 2017-04-30 LAB — CBC
HCT: 42.7 % (ref 39.0–52.0)
Hemoglobin: 14.2 g/dL (ref 13.0–17.0)
MCH: 30.9 pg (ref 26.0–34.0)
MCHC: 33.3 g/dL (ref 30.0–36.0)
MCV: 93 fL (ref 78.0–100.0)
PLATELETS: 215 10*3/uL (ref 150–400)
RBC: 4.59 MIL/uL (ref 4.22–5.81)
RDW: 13.2 % (ref 11.5–15.5)
WBC: 11 10*3/uL — ABNORMAL HIGH (ref 4.0–10.5)

## 2017-04-30 LAB — URINALYSIS, ROUTINE W REFLEX MICROSCOPIC
BILIRUBIN URINE: NEGATIVE
GLUCOSE, UA: NEGATIVE mg/dL
Hgb urine dipstick: NEGATIVE
KETONES UR: NEGATIVE mg/dL
Leukocytes, UA: NEGATIVE
NITRITE: NEGATIVE
PROTEIN: NEGATIVE mg/dL
Specific Gravity, Urine: 1.025 (ref 1.005–1.030)
pH: 6 (ref 5.0–8.0)

## 2017-04-30 LAB — DIFFERENTIAL
BASOS PCT: 0 %
Basophils Absolute: 0 10*3/uL (ref 0.0–0.1)
EOS ABS: 0.2 10*3/uL (ref 0.0–0.7)
EOS PCT: 2 %
Lymphocytes Relative: 29 %
Lymphs Abs: 3.2 10*3/uL (ref 0.7–4.0)
MONOS PCT: 8 %
Monocytes Absolute: 0.8 10*3/uL (ref 0.1–1.0)
NEUTROS PCT: 61 %
Neutro Abs: 6.8 10*3/uL (ref 1.7–7.7)

## 2017-04-30 MED ORDER — SODIUM CHLORIDE 0.9 % IV BOLUS (SEPSIS)
1000.0000 mL | Freq: Once | INTRAVENOUS | Status: AC
  Administered 2017-04-30: 1000 mL via INTRAVENOUS

## 2017-04-30 MED ORDER — HYDROMORPHONE HCL 1 MG/ML IJ SOLN
1.0000 mg | Freq: Once | INTRAMUSCULAR | Status: AC
  Administered 2017-04-30: 1 mg via INTRAVENOUS
  Filled 2017-04-30: qty 1

## 2017-04-30 NOTE — ED Triage Notes (Signed)
Per [facility] pt coming from home stating he received a phone call from PCP stating he had blood work showing infection. C/o lower back pain x 4 days and darker urine.   154/102 94% RA 95 HR 122 CBG

## 2017-04-30 NOTE — ED Notes (Addendum)
Pt stated to tech "good luck" when asked if pt had good veins for blood draw.  Blood draw was attempted one time in the right a/c. Blood flowed but did not continue.  Blood draw attempt could not be attempted in other arm or in the pt's hands due to not finding good vein and bilateral hands swollen.

## 2017-04-30 NOTE — ED Provider Notes (Signed)
[facility]-EMERGENCY DEPT Provider Note   CSN: [id number] Arrival date & time: 04/30/17  1824    History   Chief Complaint Chief Complaint  Patient presents with  . Back Pain    HPI WILLFORD RABIDEAU is a 42 y.o. male.  42 year old male with a history of hypertension, diabetes, dyslipidemia, morbid obesity, bipolar disorder presents to the emergency department for back pain.  Patient states, "I cannot walk".  When asked what happens when he tries to walk he states "I fall down".  He notes worsening back pain over the past 3 days described as burning and sharp.  It is associated with some paresthesias along the lateral aspect of his right leg.  Symptoms aggravated with movement.  He is followed by pain management and takes 30 mg oxycodone which has not been improving his pain.  He was seen by his pain management doctor on Thursday who ran blood work and advised he come to the hospital for "blood work showing infection".  He denies any bowel or bladder incontinence.  He does note a fever of 101.2 F yesterday.  He endorses the use of IV cocaine 3 weeks ago on the anniversary of his wife's death.    Back Pain      Past Medical History:  Diagnosis Date  . Adult ADHD   . Anaphylactic reaction   . Anxiety   . Asthma   . Bipolar disorder (HCC)   . Complication of anesthesia    Per patient difficult intubation;  . Diabetes mellitus   . Difficult intubation    Per patient  . Drug overdose   . Heart valve disorder    s/p echocardiogram  . Hyperlipidemia   . Hypertension   . Morbidly obese (HCC)   . OSA (obstructive sleep apnea)   . Polysubstance abuse (HCC)   . Transient cerebral ischemia    Unknown    Patient Active Problem List   Diagnosis Date Noted  . 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 Cellulitis of left lower extremity   . Hand abscess   . IVDU (intravenous drug user)   . Hepatitis C antibody  positive in blood   . Cellulitis in diabetic foot (HCC) 01/10/2017  . Diabetic foot ulcer (HCC) 01/10/2017  . Hypotension 11/11/2016  . Suicide attempt (HCC) 10/17/2016  . Acute and chronic respiratory failure with hypercapnia (HCC)   . Pulmonary edema   . Syncope   . Respiratory failure (HCC) 01/04/2016  . Drug overdose   . Acute encephalopathy 01/01/2016  . Hyponatremia 01/01/2016  . Chronic pain syndrome 10/22/2015  . Healthcare maintenance 10/22/2015  . Hoarse voice quality 10/11/2015  . MDD (major depressive disorder), recurrent episode, moderate (HCC) 09/04/2015  . Polysubstance dependence including opioid type drug, episodic abuse (HCC) 09/04/2015  . RUQ abdominal pain   . Cholelithiasis 05/17/2015  . Antisocial personality disorder (HCC) 04/11/2015  . Opioid use disorder, severe, dependence (HCC) 04/11/2015  . Benzodiazepine dependence (HCC) 04/11/2015  . Tobacco use disorder 04/10/2015  . Narcotic dependency, continuous (HCC) 03/14/2014  . Chronic back pain greater than 3 months duration 01/28/2014  . Gastroesophageal reflux disease with esophagitis 01/28/2014  . Essential hypertension, benign 01/28/2014  . Diabetes mellitus type II, controlled (HCC) 01/28/2014  . Bipolar 1 disorder, mixed, moderate (HCC) 01/28/2014  . Morbid obesity (HCC) 01/28/2014  . TIA (transient ischemic attack) 09/02/2013  . HLD (hyperlipidemia) 08/06/2013  . OSA on CPAP 08/06/2013  Past Surgical History:  Procedure Laterality Date  . CARPAL TUNNEL RELEASE    . ESOPHAGOGASTRODUODENOSCOPY N/A 04/05/2015   Procedure: ESOPHAGOGASTRODUODENOSCOPY (EGD);  Surgeon: Malissa Hippo, MD;  Location: [facility];  Service: Endoscopy;  Laterality: N/A;  . ESOPHAGOGASTRODUODENOSCOPY (EGD) WITH PROPOFOL N/A 05/21/2015   Procedure: ESOPHAGOGASTRODUODENOSCOPY (EGD) WITH PROPOFOL;  Surgeon: Meryl Dare, MD;  Location: [facility] ENDOSCOPY;  Service: Endoscopy;  Laterality: N/A;  . NOSE SURGERY    . TOOTH  EXTRACTION    . WISDOM TOOTH EXTRACTION         Home Medications    Prior to Admission medications   Medication Sig Start Date End Date Taking? Authorizing Provider  albuterol (PROVENTIL HFA;VENTOLIN HFA) 108 (90 Base) MCG/ACT inhaler Inhale 1-2 puffs into the lungs every 6 (six) hours as needed for wheezing or shortness of breath. 02/10/17   Armandina Stammer I, NP  atorvastatin (LIPITOR) 20 MG tablet Take 1 tablet (20 mg total) by mouth at bedtime. For high cholesterol 02/10/17   Nwoko, Nicole Kindred I, NP  COMBIVENT RESPIMAT 20-100 MCG/ACT AERS respimat Inhale 1 puff into the lungs every 6 (six) hours as needed for wheezing or shortness of breath. 02/10/17   Armandina Stammer I, NP  DULoxetine (CYMBALTA) 30 MG capsule Take 3 capsules (90 mg total) by mouth daily. For depression 02/11/17   Armandina Stammer I, NP  furosemide (LASIX) 40 MG tablet Take 1 tablet (40 mg total) by mouth daily. For swellings 02/10/17   Armandina Stammer I, NP  gabapentin (NEURONTIN) 400 MG capsule Take 2 capsules (800 mg total) by mouth 3 (three) times daily. For agitation/Neuropathy 02/10/17   Armandina Stammer I, NP  hydrOXYzine (ATARAX/VISTARIL) 25 MG tablet Take 1 tablet (25 mg total) by mouth every 6 (six) hours as needed for anxiety. 02/10/17   Armandina Stammer I, NP  omeprazole (PRILOSEC) 40 MG capsule Take 1 capsule (40 mg total) by mouth 2 (two) times daily. For acid reflux 02/10/17   Armandina Stammer I, NP  polyethylene glycol powder (MIRALAX) powder Take 17 g by mouth daily. For constipation 02/10/17   Armandina Stammer I, NP  QUEtiapine (SEROQUEL) 100 MG tablet Take 1 tablet (100 mg total) by mouth daily. For mood control 02/11/17   Armandina Stammer I, NP  QUEtiapine (SEROQUEL) 400 MG tablet Take 1 tablet (400 mg total) by mouth at bedtime. For mood control 02/10/17   Armandina Stammer I, NP  traZODone (DESYREL) 150 MG tablet Take 1 tablet (150 mg total) by mouth at bedtime as needed for sleep. 02/10/17   Armandina Stammer I, NP  ursodiol (ACTIGALL) 300 MG  capsule Take 1 capsule (300 mg total) by mouth 2 (two) times daily. For kidney stone 02/10/17   Armandina Stammer I, NP  lisinopril (PRINIVIL,ZESTRIL) 40 MG tablet Take 1 tablet (40 mg total) by mouth 2 (two) times daily. For high blood pressure 09/05/15 10/01/15  Sanjuana Kava, NP    Family History Family History  Problem Relation Age of Onset  . CAD Mother        Living  . Diabetes Mellitus II Mother   . Stroke Mother   . Hypertension Mother   . Congestive Heart Failure Mother   . Kidney disease Mother   . Fibromyalgia Mother   . Thyroid disease Mother   . Hyperlipidemia Mother   . Liver disease Mother   . Alcoholism Father 82       Deceased  . Arthritis Maternal Grandmother   . Congestive Heart Failure  Maternal Grandmother   . Hypertension Maternal Grandmother   . Lung cancer Maternal Grandfather   . Colon cancer Maternal Aunt   . Stomach cancer Maternal Aunt   . Heart disease Other        Paternal & Maternal  . Crohn's disease Other   . Hypertension Other        Paternal & Maternal  . Hypertension Brother        x3  . Hypertension Sister        #1  . Bipolar disorder Sister        #1  . ADD / ADHD Son        x3  . Bipolar disorder Son        x3  . Asperger's syndrome Son     Social History Social History   Tobacco Use  . Smoking status: Current Every Day Smoker    Packs/day: 0.50    Years: 23.00    Pack years: 11.50    Types: Cigarettes  . Smokeless tobacco: Never Used  Substance Use Topics  . Alcohol use: No    Alcohol/week: 0.0 oz  . Drug use: Yes    Types: Benzodiazepines, Heroin     Allergies   Ace inhibitors; Atenolol; Lisinopril; Prednisone; Ibuprofen; and Tylenol [acetaminophen]   Review of Systems Review of Systems  Musculoskeletal: Positive for back pain.  Ten systems reviewed and are negative for acute change, except as noted in the HPI.     Physical Exam Updated Vital Signs BP (!) 166/96 (BP Location: Left Arm)   Pulse 81   Temp 99.8  F (37.7 C) (Oral)   Resp 20   SpO2 95%   Physical Exam  Constitutional: He is oriented to person, place, and time. He appears well-developed and well-nourished. No distress.  Morbidly obese.  HENT:  Head: Normocephalic and atraumatic.  Eyes: Conjunctivae and EOM are normal. No scleral icterus.  Neck: Normal range of motion.  Cardiovascular: Normal rate, regular rhythm and intact distal pulses.  Pulmonary/Chest: Effort normal. No stridor. No respiratory distress. He has no wheezes.  Respirations even and unlabored  Musculoskeletal: Normal range of motion.  Neurological: He is alert and oriented to person, place, and time.  Patient able to move all extremities spontaneously.  Grip strength 5/5 bilaterally.  Patient has 5/5 strength in the left lower extremity against resistance. 4/5 strength in the RLE against resistance.   Skin: Skin is warm and dry. No rash noted. He is not diaphoretic. No erythema. No pallor.  Psychiatric: He has a normal mood and affect. His behavior is normal.  Nursing note and vitals reviewed.    ED Treatments / Results  Labs (all labs ordered are listed, but only abnormal results are displayed) Labs Reviewed  COMPREHENSIVE METABOLIC PANEL - Abnormal; Notable for the following components:      Result Value   Chloride 100 (*)    Glucose, Bld 137 (*)    Calcium 8.8 (*)    AST 58 (*)    ALT 77 (*)    All other components within normal limits  CBC - Abnormal; Notable for the following components:   WBC 11.0 (*)    All other components within normal limits  CULTURE, BLOOD (ROUTINE X 2)  CULTURE, BLOOD (ROUTINE X 2)  URINALYSIS, ROUTINE W REFLEX MICROSCOPIC  CK  DIFFERENTIAL    EKG  EKG Interpretation None       Radiology No results found.  Procedures Procedures (including critical  care time)  Medications Ordered in ED Medications  sodium chloride 0.9 % bolus 1,000 mL (1,000 mLs Intravenous New Bag/Given 04/30/17 2252)  HYDROmorphone  (DILAUDID) injection 1 mg (1 mg Intravenous Given 04/30/17 2252)     Initial Impression / Assessment and Plan / ED Course  I have reviewed the triage vital signs and the nursing notes.  Pertinent labs & imaging results that were available during my care of the patient were reviewed by me and considered in my medical decision making (see chart for details).     42 year old male presents to the emergency department for back pain with inability to ambulate over the past 3 days.  He reports weakness in his lower extremities.  There is mild right lower extremity weakness on exam.  Otherwise neurovascularly intact.  No history of bowel or bladder incontinence.  He does report a temperature of 101.2 F at home yesterday.  He has a mild leukocytosis with history of IV cocaine use 3 weeks ago.  Concern for epidural abscess versus discitis versus acute on chronic back pain.  He is followed by pain management.  [facility] of MRI of the thoracic and lumbar spine with and without contrast.  Case discussed with Dr. Elesa MassedWard who has accepted patient in transfer.    No current indication for emergent antibiotics as patient afebrile in the ED and not currently meeting SIRS or Sepsis criteria.   Final Clinical Impressions(s) / ED Diagnoses   Final diagnoses:  Acute midline low back pain, with sciatica presence unspecified    ED Discharge Orders    None       Antony MaduraHumes, Yariana Hoaglund, PA-C 04/30/17 2337    Shaune PollackIsaacs, Cameron, MD 05/01/17 [phone]

## 2017-05-01 ENCOUNTER — Emergency Department (HOSPITAL_COMMUNITY): Payer: Medicare Other

## 2017-05-01 ENCOUNTER — Other Ambulatory Visit: Payer: Self-pay

## 2017-05-01 ENCOUNTER — Inpatient Hospital Stay (HOSPITAL_COMMUNITY): Payer: Medicare Other

## 2017-05-01 ENCOUNTER — Encounter (HOSPITAL_COMMUNITY): Payer: Self-pay | Admitting: Internal Medicine

## 2017-05-01 DIAGNOSIS — E1140 Type 2 diabetes mellitus with diabetic neuropathy, unspecified: Secondary | ICD-10-CM | POA: Diagnosis not present

## 2017-05-01 DIAGNOSIS — F1190 Opioid use, unspecified, uncomplicated: Secondary | ICD-10-CM | POA: Diagnosis not present

## 2017-05-01 DIAGNOSIS — E1142 Type 2 diabetes mellitus with diabetic polyneuropathy: Secondary | ICD-10-CM | POA: Diagnosis not present

## 2017-05-01 DIAGNOSIS — F17210 Nicotine dependence, cigarettes, uncomplicated: Secondary | ICD-10-CM | POA: Diagnosis not present

## 2017-05-01 DIAGNOSIS — Z888 Allergy status to other drugs, medicaments and biological substances status: Secondary | ICD-10-CM | POA: Diagnosis not present

## 2017-05-01 DIAGNOSIS — E876 Hypokalemia: Secondary | ICD-10-CM | POA: Diagnosis not present

## 2017-05-01 DIAGNOSIS — M5440 Lumbago with sciatica, unspecified side: Secondary | ICD-10-CM | POA: Diagnosis not present

## 2017-05-01 DIAGNOSIS — F1390 Sedative, hypnotic, or anxiolytic use, unspecified, uncomplicated: Secondary | ICD-10-CM | POA: Diagnosis not present

## 2017-05-01 DIAGNOSIS — E1169 Type 2 diabetes mellitus with other specified complication: Secondary | ICD-10-CM | POA: Diagnosis not present

## 2017-05-01 DIAGNOSIS — E669 Obesity, unspecified: Secondary | ICD-10-CM | POA: Diagnosis not present

## 2017-05-01 DIAGNOSIS — G894 Chronic pain syndrome: Secondary | ICD-10-CM | POA: Diagnosis present

## 2017-05-01 DIAGNOSIS — F319 Bipolar disorder, unspecified: Secondary | ICD-10-CM | POA: Diagnosis not present

## 2017-05-01 DIAGNOSIS — M5127 Other intervertebral disc displacement, lumbosacral region: Secondary | ICD-10-CM | POA: Diagnosis not present

## 2017-05-01 DIAGNOSIS — J45909 Unspecified asthma, uncomplicated: Secondary | ICD-10-CM | POA: Diagnosis not present

## 2017-05-01 DIAGNOSIS — M4804 Spinal stenosis, thoracic region: Secondary | ICD-10-CM | POA: Diagnosis not present

## 2017-05-01 DIAGNOSIS — I5032 Chronic diastolic (congestive) heart failure: Secondary | ICD-10-CM | POA: Diagnosis present

## 2017-05-01 DIAGNOSIS — F17200 Nicotine dependence, unspecified, uncomplicated: Secondary | ICD-10-CM | POA: Diagnosis not present

## 2017-05-01 DIAGNOSIS — B1920 Unspecified viral hepatitis C without hepatic coma: Secondary | ICD-10-CM | POA: Diagnosis not present

## 2017-05-01 DIAGNOSIS — M4694 Unspecified inflammatory spondylopathy, thoracic region: Secondary | ICD-10-CM | POA: Diagnosis not present

## 2017-05-01 DIAGNOSIS — B182 Chronic viral hepatitis C: Secondary | ICD-10-CM | POA: Diagnosis present

## 2017-05-01 DIAGNOSIS — Z5181 Encounter for therapeutic drug level monitoring: Secondary | ICD-10-CM | POA: Diagnosis not present

## 2017-05-01 DIAGNOSIS — M4620 Osteomyelitis of vertebra, site unspecified: Secondary | ICD-10-CM | POA: Diagnosis present

## 2017-05-01 DIAGNOSIS — F1490 Cocaine use, unspecified, uncomplicated: Secondary | ICD-10-CM | POA: Diagnosis not present

## 2017-05-01 DIAGNOSIS — I110 Hypertensive heart disease with heart failure: Secondary | ICD-10-CM | POA: Diagnosis present

## 2017-05-01 DIAGNOSIS — G4733 Obstructive sleep apnea (adult) (pediatric): Secondary | ICD-10-CM | POA: Diagnosis not present

## 2017-05-01 DIAGNOSIS — E119 Type 2 diabetes mellitus without complications: Secondary | ICD-10-CM | POA: Diagnosis not present

## 2017-05-01 DIAGNOSIS — M4624 Osteomyelitis of vertebra, thoracic region: Secondary | ICD-10-CM | POA: Diagnosis present

## 2017-05-01 DIAGNOSIS — Z6841 Body Mass Index (BMI) 40.0 and over, adult: Secondary | ICD-10-CM | POA: Diagnosis not present

## 2017-05-01 DIAGNOSIS — Z9989 Dependence on other enabling machines and devices: Secondary | ICD-10-CM | POA: Diagnosis not present

## 2017-05-01 DIAGNOSIS — F1410 Cocaine abuse, uncomplicated: Secondary | ICD-10-CM | POA: Diagnosis not present

## 2017-05-01 DIAGNOSIS — H02401 Unspecified ptosis of right eyelid: Secondary | ICD-10-CM | POA: Diagnosis not present

## 2017-05-01 DIAGNOSIS — K5900 Constipation, unspecified: Secondary | ICD-10-CM | POA: Diagnosis not present

## 2017-05-01 DIAGNOSIS — G4730 Sleep apnea, unspecified: Secondary | ICD-10-CM | POA: Diagnosis not present

## 2017-05-01 DIAGNOSIS — E785 Hyperlipidemia, unspecified: Secondary | ICD-10-CM | POA: Diagnosis not present

## 2017-05-01 DIAGNOSIS — Z79899 Other long term (current) drug therapy: Secondary | ICD-10-CM | POA: Diagnosis not present

## 2017-05-01 DIAGNOSIS — G8929 Other chronic pain: Secondary | ICD-10-CM | POA: Diagnosis not present

## 2017-05-01 DIAGNOSIS — M5134 Other intervertebral disc degeneration, thoracic region: Secondary | ICD-10-CM | POA: Diagnosis not present

## 2017-05-01 DIAGNOSIS — Z886 Allergy status to analgesic agent status: Secondary | ICD-10-CM | POA: Diagnosis not present

## 2017-05-01 DIAGNOSIS — M4644 Discitis, unspecified, thoracic region: Secondary | ICD-10-CM | POA: Diagnosis not present

## 2017-05-01 DIAGNOSIS — M21371 Foot drop, right foot: Secondary | ICD-10-CM | POA: Diagnosis not present

## 2017-05-01 DIAGNOSIS — Z8249 Family history of ischemic heart disease and other diseases of the circulatory system: Secondary | ICD-10-CM | POA: Diagnosis not present

## 2017-05-01 DIAGNOSIS — F909 Attention-deficit hyperactivity disorder, unspecified type: Secondary | ICD-10-CM | POA: Diagnosis not present

## 2017-05-01 DIAGNOSIS — Z8673 Personal history of transient ischemic attack (TIA), and cerebral infarction without residual deficits: Secondary | ICD-10-CM | POA: Diagnosis not present

## 2017-05-01 LAB — MRSA PCR SCREENING: MRSA BY PCR: NEGATIVE

## 2017-05-01 LAB — PROTIME-INR
INR: 1.04
PROTHROMBIN TIME: 13.5 s (ref 11.4–15.2)

## 2017-05-01 LAB — GLUCOSE, CAPILLARY
GLUCOSE-CAPILLARY: 131 mg/dL — AB (ref 65–99)
Glucose-Capillary: 120 mg/dL — ABNORMAL HIGH (ref 65–99)

## 2017-05-01 LAB — APTT: aPTT: 27 seconds (ref 24–36)

## 2017-05-01 LAB — HEMOGLOBIN A1C
Hgb A1c MFr Bld: 5.7 % — ABNORMAL HIGH (ref 4.8–5.6)
Mean Plasma Glucose: 116.89 mg/dL

## 2017-05-01 MED ORDER — HYDROXYZINE HCL 25 MG PO TABS: 25.0000 mg | ORAL_TABLET | Freq: Four times a day (QID) | ORAL | Status: DC | PRN

## 2017-05-01 MED ORDER — QUETIAPINE FUMARATE 400 MG PO TABS
400.0000 mg | ORAL_TABLET | Freq: Every day | ORAL | Status: DC
  Administered 2017-05-01: 400 mg via ORAL
  Filled 2017-05-01: qty 1

## 2017-05-01 MED ORDER — VANCOMYCIN HCL 10 G IV SOLR
2500.0000 mg | Freq: Once | INTRAVENOUS | Status: AC
  Administered 2017-05-01: 2500 mg via INTRAVENOUS
  Filled 2017-05-01: qty 2500

## 2017-05-01 MED ORDER — GABAPENTIN 400 MG PO CAPS
800.0000 mg | ORAL_CAPSULE | Freq: Three times a day (TID) | ORAL | Status: DC
  Administered 2017-05-01 – 2017-05-04 (×11): 800 mg via ORAL
  Filled 2017-05-01 (×11): qty 2

## 2017-05-01 MED ORDER — SODIUM CHLORIDE 0.9 % IV SOLN
2.0000 g | Freq: Three times a day (TID) | INTRAVENOUS | Status: DC
  Administered 2017-05-01 – 2017-05-02 (×2): 2 g via INTRAVENOUS
  Filled 2017-05-01 (×3): qty 2

## 2017-05-01 MED ORDER — MIDAZOLAM HCL 2 MG/2ML IJ SOLN
INTRAMUSCULAR | Status: AC | PRN
  Administered 2017-05-01: 1 mg via INTRAVENOUS

## 2017-05-01 MED ORDER — OXYCODONE HCL 5 MG PO TABS: 5.0000 mg | ORAL_TABLET | ORAL | Status: DC | PRN

## 2017-05-01 MED ORDER — MIDAZOLAM HCL 2 MG/2ML IJ SOLN
INTRAMUSCULAR | Status: AC
  Filled 2017-05-01: qty 4

## 2017-05-01 MED ORDER — IPRATROPIUM-ALBUTEROL 0.5-2.5 (3) MG/3ML IN SOLN: 3.0000 mL | Freq: Four times a day (QID) | RESPIRATORY_TRACT | Status: DC | PRN

## 2017-05-01 MED ORDER — FENTANYL CITRATE (PF) 100 MCG/2ML IJ SOLN
INTRAMUSCULAR | Status: AC
  Filled 2017-05-01: qty 4

## 2017-05-01 MED ORDER — EPINEPHRINE PF 1 MG/10ML IJ SOSY
PREFILLED_SYRINGE | INTRAMUSCULAR | Status: AC
  Filled 2017-05-01: qty 10

## 2017-05-01 MED ORDER — TRAZODONE HCL 50 MG PO TABS
150.0000 mg | ORAL_TABLET | Freq: Every evening | ORAL | Status: DC | PRN
  Administered 2017-05-01 – 2017-05-03 (×3): 150 mg via ORAL
  Filled 2017-05-01 (×3): qty 1

## 2017-05-01 MED ORDER — HYDROMORPHONE HCL 1 MG/ML IJ SOLN
1.0000 mg | Freq: Once | INTRAMUSCULAR | Status: AC
  Administered 2017-05-01: 1 mg via INTRAVENOUS
  Filled 2017-05-01: qty 1

## 2017-05-01 MED ORDER — ONDANSETRON HCL 4 MG/2ML IJ SOLN: 4.0000 mg | Freq: Three times a day (TID) | INTRAMUSCULAR | Status: DC | PRN

## 2017-05-01 MED ORDER — OXYCODONE HCL 5 MG PO TABS: 10.0000 mg | ORAL_TABLET | Freq: Three times a day (TID) | ORAL | Status: DC

## 2017-05-01 MED ORDER — SODIUM CHLORIDE 0.9 % IV SOLN
2.0000 g | Freq: Once | INTRAVENOUS | Status: AC
  Administered 2017-05-01: 2 g via INTRAVENOUS
  Filled 2017-05-01: qty 2

## 2017-05-01 MED ORDER — URSODIOL 300 MG PO CAPS
300.0000 mg | ORAL_CAPSULE | Freq: Two times a day (BID) | ORAL | Status: DC
Start: 2017-05-01 — End: 2017-05-02
  Administered 2017-05-01 (×2): 300 mg via ORAL
  Filled 2017-05-01 (×3): qty 1

## 2017-05-01 MED ORDER — ACETAMINOPHEN 650 MG RE SUPP: 650.0000 mg | Freq: Four times a day (QID) | RECTAL | Status: DC | PRN

## 2017-05-01 MED ORDER — HYDROMORPHONE HCL 1 MG/ML IJ SOLN
1.0000 mg | INTRAMUSCULAR | Status: DC | PRN
  Administered 2017-05-01 – 2017-05-04 (×19): 1 mg via INTRAVENOUS
  Filled 2017-05-01 (×18): qty 1

## 2017-05-01 MED ORDER — VANCOMYCIN HCL 10 G IV SOLR
1250.0000 mg | Freq: Three times a day (TID) | INTRAVENOUS | Status: DC
  Administered 2017-05-01 – 2017-05-04 (×8): 1250 mg via INTRAVENOUS
  Filled 2017-05-01 (×10): qty 1250

## 2017-05-01 MED ORDER — ACETAMINOPHEN 325 MG PO TABS: 650.0000 mg | ORAL_TABLET | Freq: Four times a day (QID) | ORAL | Status: DC | PRN

## 2017-05-01 MED ORDER — QUETIAPINE FUMARATE 100 MG PO TABS
100.0000 mg | ORAL_TABLET | Freq: Every day | ORAL | Status: DC
  Filled 2017-05-01 (×2): qty 1

## 2017-05-01 MED ORDER — OXYCODONE HCL 5 MG PO TABS
10.0000 mg | ORAL_TABLET | Freq: Three times a day (TID) | ORAL | Status: DC
  Administered 2017-05-01 (×2): 10 mg via ORAL
  Filled 2017-05-01 (×2): qty 2

## 2017-05-01 MED ORDER — LORAZEPAM 2 MG/ML IJ SOLN
1.0000 mg | Freq: Once | INTRAMUSCULAR | Status: AC
  Administered 2017-05-01: 1 mg via INTRAVENOUS
  Filled 2017-05-01: qty 1

## 2017-05-01 MED ORDER — HYDROMORPHONE HCL 1 MG/ML IJ SOLN: 1.0000 mg | INTRAMUSCULAR | Status: DC | PRN

## 2017-05-01 MED ORDER — FENTANYL CITRATE (PF) 100 MCG/2ML IJ SOLN
INTRAMUSCULAR | Status: AC | PRN
  Administered 2017-05-01 (×2): 50 ug via INTRAVENOUS

## 2017-05-01 MED ORDER — LORAZEPAM 2 MG/ML IJ SOLN
INTRAMUSCULAR | Status: AC | PRN
  Administered 2017-05-01: 1 mg via INTRAVENOUS

## 2017-05-01 MED ORDER — INSULIN ASPART 100 UNIT/ML ~~LOC~~ SOLN
0.0000 [IU] | Freq: Three times a day (TID) | SUBCUTANEOUS | Status: DC
  Administered 2017-05-01 – 2017-05-02 (×2): 1 [IU] via SUBCUTANEOUS
  Administered 2017-05-02: 3 [IU] via SUBCUTANEOUS
  Administered 2017-05-03 – 2017-05-04 (×2): 1 [IU] via SUBCUTANEOUS

## 2017-05-01 MED ORDER — GADOBENATE DIMEGLUMINE 529 MG/ML IV SOLN
20.0000 mL | Freq: Once | INTRAVENOUS | Status: AC | PRN
  Administered 2017-05-01: 20 mL via INTRAVENOUS

## 2017-05-01 MED ORDER — PANTOPRAZOLE SODIUM 40 MG PO TBEC
40.0000 mg | DELAYED_RELEASE_TABLET | Freq: Every day | ORAL | Status: DC
  Administered 2017-05-01 – 2017-05-03 (×3): 40 mg via ORAL
  Filled 2017-05-01 (×3): qty 1

## 2017-05-01 MED ORDER — DULOXETINE HCL 60 MG PO CPEP
90.0000 mg | ORAL_CAPSULE | Freq: Every day | ORAL | Status: DC
  Filled 2017-05-01: qty 1

## 2017-05-01 NOTE — ED Triage Notes (Signed)
PT is absent from room for MR

## 2017-05-01 NOTE — ED Notes (Signed)
Unable to get patient to sign, patient gave verbal consent for transfer

## 2017-05-01 NOTE — Progress Notes (Signed)
Pt states meds are not right in the computer. He does not take Seroquel or cymbata but takes Geodon . Pt does not know the strengths of his meds and his pharmacy is closed for today Renaee Munda([facility] [phone]). Notified IM on call MD. Barbera Settersurner, Birgit Nowling B RN

## 2017-05-01 NOTE — H&P (Addendum)
Date: 05/01/2017               Patient Name:  Bryan Gallegos L Gillham MRN: [id number]  DOB: 08/27/1975 Age / Sex: 42 y.o., male   PCP: Patient, No Pcp Per         Medical Service: Internal Medicine Teaching Service         Attending Physician: Dr. Levert FeinsteinGranfortuna, James M, MD    First Contact: Dr. Lorenso CourierVahini Allayah Raineri Pager: [phone]  Second Contact: Dr. Noemi ChapelBethany Molt Pager: [phone]       After Hours (After 5p/  First Contact Pager: [phone]  weekends / holidays): Second Contact Pager: [phone]   Chief Complaint: Back pain  History of Present Illness: [name] is a 42 y.o male with essential hypertension, diabetes mellitus type 2, history of IV drug use, TIA, opioid use disorder, major depressive disorder, bipolar disorder, chronic pain syndrome for which he gets 30mg  oxycodone from pain management, morbid obesity who presents with back pain. The patient states that he has had 4 day history of lower back pain that is 10/10 intensity, sharp/burning in nature, present over the lumbar spine, constantly present, worsened with any movement, deep breath, or cough. The back pain is accompanied by right lower extremity weakness, tingling in his right leg, darker colored urine. The patient reports not being able to walk and falling down when he tries to walk. The patient has not noted any bowel or bladder incontinence.   The patient was seen by pain management doctor on 04/28/17 who stated that he should be admitted due to his blood work showing signs of an infection. [facility] and was not found to have any signs or symptoms of sepsis so was not given any empiric antibiotics and transferred to [facility] to get MRI of thoracic and lumbar spine with and without contrast due to his morbid obesity. The patient reports last using IV cocaine 3 weeks ago on his wife's death anniversary.   ED Course: Hypertensive (145/122), normal pulse, tachypnic (rr=20), sp02=95% Chest x-ray, MRI thoracic  and lumbar spine done  Meds:  Current Meds  Medication Sig  . albuterol (PROVENTIL HFA;VENTOLIN HFA) 108 (90 Base) MCG/ACT inhaler Inhale 1-2 puffs into the lungs every 6 (six) hours as needed for wheezing or shortness of breath.  Marland Kitchen. atorvastatin (LIPITOR) 20 MG tablet Take 1 tablet (20 mg total) by mouth at bedtime. For high cholesterol  . COMBIVENT RESPIMAT 20-100 MCG/ACT AERS respimat Inhale 1 puff into the lungs every 6 (six) hours as needed for wheezing or shortness of breath.  . gabapentin (NEURONTIN) 400 MG capsule Take 2 capsules (800 mg total) by mouth 3 (three) times daily. For agitation/Neuropathy  . hydrOXYzine (ATARAX/VISTARIL) 25 MG tablet Take 1 tablet (25 mg total) by mouth every 6 (six) hours as needed for anxiety.  Marland Kitchen. omeprazole (PRILOSEC) 40 MG capsule Take 1 capsule (40 mg total) by mouth 2 (two) times daily. For acid reflux  . tiZANidine (ZANAFLEX) 4 MG capsule Take 4 mg by mouth 3 (three) times daily as needed for muscle spasms.  . traZODone (DESYREL) 150 MG tablet Take 1 tablet (150 mg total) by mouth at bedtime as needed for sleep.     Allergies: Allergies as of 04/30/2017 - Review Complete 04/30/2017  Allergen Reaction Noted  . Ace inhibitors Anaphylaxis 10/17/2016  . Atenolol Anaphylaxis 06/19/2011  . Lisinopril Anaphylaxis 10/01/2015  . Prednisone Hives 09/28/2015  . Ibuprofen Itching, Swelling, and Other (  See Comments) 01/03/2016  . Tylenol [acetaminophen] Other (See Comments) 05/29/2011   Past Medical History:  Diagnosis Date  . Adult ADHD   . Anaphylactic reaction   . Anxiety   . Asthma   . Bipolar disorder (HCC)   . Complication of anesthesia    Per patient difficult intubation;  . Diabetes mellitus   . Difficult intubation    Per patient  . Drug overdose   . Heart valve disorder    s/p echocardiogram  . Hyperlipidemia   . Hypertension   . Morbidly obese (HCC)   . OSA (obstructive sleep apnea)   . Polysubstance abuse (HCC)   . Transient  cerebral ischemia    Unknown    Family History:  Family History  Problem Relation Age of Onset  . CAD Mother        Living  . Diabetes Mellitus II Mother   . Stroke Mother   . Hypertension Mother   . Congestive Heart Failure Mother   . Kidney disease Mother   . Fibromyalgia Mother   . Thyroid disease Mother   . Hyperlipidemia Mother   . Liver disease Mother   . Alcoholism Father 31       Deceased  . Arthritis Maternal Grandmother   . Congestive Heart Failure Maternal Grandmother   . Hypertension Maternal Grandmother   . Lung cancer Maternal Grandfather   . Colon cancer Maternal Aunt   . Stomach cancer Maternal Aunt   . Heart disease Other        Paternal & Maternal  . Crohn's disease Other   . Hypertension Other        Paternal & Maternal  . Hypertension Brother        x3  . Hypertension Sister        #1  . Bipolar disorder Sister        #1  . ADD / ADHD Son        x3  . Bipolar disorder Son        x3  . Asperger's syndrome Son    Social History:  Social History   Socioeconomic History  . Marital status: Single    Spouse name: None  . Number of children: None  . Years of education: None  . Highest education level: None  Social Needs  . Financial resource strain: None  . Food insecurity - worry: None  . Food insecurity - inability: None  . Transportation needs - medical: None  . Transportation needs - non-medical: None  Occupational History  . None  Tobacco Use  . Smoking status: Current Every Day Smoker    Packs/day: 1.00    Years: 23.00    Pack years: 23.00    Types: Cigarettes  . Smokeless tobacco: Never Used  Substance and Sexual Activity  . Alcohol use: No    Alcohol/week: 0.0 oz  . Drug use: Yes    Types: Benzodiazepines, Heroin, Cocaine  . Sexual activity: Not Currently    Partners: Female    Birth control/protection: None  Other Topics Concern  . None  Social History Narrative  . None   Lives with grandmother and son.  Smoked for  the past 72yrs 0.5-1ppd  Review of Systems: A complete ROS was negative except as per HPI. Denies headaches, sob, or chest pain  Physical Exam: Blood pressure (!) 159/110, pulse 79, temperature 98 F (36.7 C), temperature source Oral, resp. rate 18, height 6' 1.5" (1.867 m), weight (!) 341 lb  0 oz (154.7 kg), SpO2 96 %.  Physical Exam  Constitutional: He appears well-developed and well-nourished. No distress.  Morbidly obese  HENT:  Head: Normocephalic and atraumatic.  Cardiovascular: Normal rate, regular rhythm and normal heart sounds.  Respiratory: Effort normal and breath sounds normal. No respiratory distress. He has no wheezes.  GI: Soft. Bowel sounds are normal. He exhibits no distension. There is tenderness (generalized on palpation).  Musculoskeletal: He exhibits tenderness (lumbar spine).  Neurological: He is alert. No cranial nerve deficit.  4/5 upper extremity strength on right side and 5/5 on left dide  Skin: He is not diaphoretic. There is erythema (small area of erythema noted on the lateral aspect of the left foot).  Psychiatric: He has a normal mood and affect. His behavior is normal. Judgment and thought content normal.   Urinalysis    Component Value Date/Time   COLORURINE YELLOW 04/30/2017 2140   APPEARANCEUR CLEAR 04/30/2017 2140   LABSPEC 1.025 04/30/2017 2140   PHURINE 6.0 04/30/2017 2140   GLUCOSEU NEGATIVE 04/30/2017 2140   HGBUR NEGATIVE 04/30/2017 2140   BILIRUBINUR NEGATIVE 04/30/2017 2140   KETONESUR NEGATIVE 04/30/2017 2140   PROTEINUR NEGATIVE 04/30/2017 2140   UROBILINOGEN 1.0 08/18/2014 2002   NITRITE NEGATIVE 04/30/2017 2140   LEUKOCYTESUR NEGATIVE 04/30/2017 2140    CMP Latest Ref Rng & Units 04/30/2017 02/08/2017 01/14/2017  Glucose 65 - 99 mg/dL 045(W) 098(J) 191(Y)  BUN 6 - 20 mg/dL 12 12 15   Creatinine 0.61 - 1.24 mg/dL 7.82 9.56 2.13  Sodium 135 - 145 mmol/L 136 135 137  Potassium 3.5 - 5.1 mmol/L 4.2 4.0 4.1  Chloride 101 - 111  mmol/L 100(L) 99(L) 105  CO2 22 - 32 mmol/L 29 28 26   Calcium 8.9 - 10.3 mg/dL 0.8(M) 5.7(Q) 4.6(N)  Total Protein 6.5 - 8.1 g/dL 7.8 7.9 7.3  Total Bilirubin 0.3 - 1.2 mg/dL 0.8 0.5 0.4  Alkaline Phos 38 - 126 U/L 82 58 51  AST 15 - 41 U/L 58(H) 26 20  ALT 17 - 63 U/L 77(H) 23 17   CBC    Component Value Date/Time   WBC 11.0 (H) 04/30/2017 2233   RBC 4.59 04/30/2017 2233   HGB 14.2 04/30/2017 2233   HCT 42.7 04/30/2017 2233   PLT 215 04/30/2017 2233   MCV 93.0 04/30/2017 2233   MCH 30.9 04/30/2017 2233   MCHC 33.3 04/30/2017 2233   RDW 13.2 04/30/2017 2233   LYMPHSABS 3.2 04/30/2017 2233   MONOABS 0.8 04/30/2017 2233   EOSABS 0.2 04/30/2017 2233   BASOSABS 0.0 04/30/2017 2233   CK=71  CXR: personally reviewed my interpretation is no pain, no pulmonary vascular congestion, signs of effusion seen  MRI thoracic and lumbar spine (05/01/2017): Mild degenerative thoracic spinal stenosis T11-T12.  Moderate sized herniation at L5-S1.  Confluent inflammation in the T11-T12 vertebral bodies and laterals paraspinal soft tissue that is concerning for acute thoracic osteomyelitis.  Assessment & Plan by Problem:  Mr. Nygard is a 42 y.o male with essential hypertension, diabetes mellitus type 2, history of IV drug use, TIA, opioid use disorder, major depressive disorder, bipolar disorder, chronic pain syndrome for which he gets 30mg  oxycodone from pain management, morbid obesity who presents with back pain. MRI thoracic and lumbar spine showed findings consistent for osteomyelitis.   Thoracic Spine Osteomyelitis  The patient is noted to have osteomyelitis in the thoracic region as seen on MRI likely secondary to his IVDU. The patient is currently afebrile and has mild leukocytosis  of 11.  -Started vancomycin and cefepime for MRSA, gram positive, and gram negative coverage. Appreciate pharmacy assistance with dosing. -Blood culture pending -Aerobic and anaerobic culture pending -PT=13.5,  INR=1.04, PTT=27 -Echocardiogram pending to evaluate for possible septic thrombus -CK=71 -Consulted IR to get vertebral biopsy -Daily bmp -Dilaudid 1mg  q2hrs prn -Ocycodone 10mg  qhrs  Chronic Diastolic Heart Failure The patient's last echo was done 01/11/17 which shows an EF of 60-65%, no regional wall motion abnormalities, grade 1 diastolic dysfunction. The patient appeared euvolemic on exam without any rales, jvd, or peripheral pitting edema.   -Held lasix   Diabetes Mellitus Type 2 Patient's HbA1c=5.3 (05/01/17). The highest a1c=7.0 in August 2017. The patient is currently not on any antihyperglycemic agents at home.   -Will continue to monitor patient's blood glucose and recommend good lifestyle modifications -continue gabapentin 800mg  tid  Essential hypertension The patient's blood pressure over the past 24 hrs has ranged 130-159/85-110. The patient is in extreme pain which maybe elevating blood pressure. He is not on any   -will continue to monitor during hospitalization.   Major depressive disorder Bipolar disorder Patient states that he was previously in a facility in [place] of for grief counseling regarding his wife's death.  He is currently taking Geodon and trazodone 150 mg nightly. The patient does not recall the dosage of his Geodon, but stated that he will get someone to find out.   -Will continue trazodone 150mg  qhs -Will commence geodon after patient informs of dose  Hyperlipidemia -Continue atorvastatin 20 mg nightly  Dispo: Admit patient to Inpatient with expected length of stay greater than 2 midnights.  Signed: Lorenso Courier, MD Internal Medicine PGY1 Pager:[phone] 05/01/2017, 2:56 PM

## 2017-05-01 NOTE — ED Triage Notes (Signed)
Meds ordered from Pharmacy 

## 2017-05-01 NOTE — Procedures (Signed)
Pre procedural Dx: Paraspinal  Phlegmon Post procedural Dx: Same  Technically successful CT guided aspiration of <1 cc of fluid from rigth T11-T12 paraspinal phlegmon.  All aspirated samples sent to the laboratory for analysis.    EBL: None  Complications: None immediate  Katherina RightJay Collene Massimino, MD Pager #: [phone]

## 2017-05-01 NOTE — ED Notes (Signed)
Patient transported to MRI 

## 2017-05-01 NOTE — Sedation Documentation (Signed)
Patient denies pain and is resting comfortably.  

## 2017-05-01 NOTE — ED Provider Notes (Signed)
1:00 AM  Patient is a 42 year old male with history of hypertension, chronic back pain who presented to the emergency department for fevers, increasing lower back pain.  Patient sent here for MRI of the thoracic and lumbar spine with and without contrast to rule out infectious etiology.  Reports fever at home of 101.2.  Had mild leukocytosis.  Reports IV cocaine use 2-3 weeks ago.  States pain is causing some tingling in the right leg and right lower extremely weakness and causes him to have difficulty ambulating secondary to pain.  No bowel or bladder incontinence.  No urinary retention.  No chest pain or shortness of breath but states he has had some chest congestion now.  No sore throat, cough, vomiting, diarrhea, abdominal pain, rash, headache, neck pain or neck stiffness.  He did have an influenza vaccination this year.  Blood cultures have been sent.  Urine shows no sign of infection.  We will add on chest x-ray.  Will give him further pain medication and Ativan prior to MRI.  He is not septic at this time.  Plan is to hold off on antibiotics until we have an obvious source of infection.  He does not meet septic criteria at this time.  He has been hypertensive and not hypotensive.  On my examination, patient reports some numbness in the right leg diffusely.  There is no saddle anesthesia.  He has good strength in all 4 extremities.  2+ deep tendon reflexes in bilateral upper and lower extremities.  No clonus.  He reports pain with right hip flexion as this causes pain in the back.   7:10 AM  Pt still in MRI.  Patient signed out to Dr. Juleen ChinaKohut to follow-up on patient's MRI results.  If these show no acute abnormality, anticipate discharge home with short course of pain medication.  We have not found any source of obvious infection at this time.   I reviewed all nursing notes, vitals, pertinent previous records, EKGs, lab and urine results, imaging (as available).    Maeci Kalbfleisch, Layla MawKristen N, DO 05/01/17  [phone]

## 2017-05-01 NOTE — ED Notes (Signed)
Patient transported to X-ray 

## 2017-05-01 NOTE — Progress Notes (Signed)
Pharmacy Antibiotic Note  Bryan Gallegos is a 42 y.o. male admitted on 04/30/2017 with vertebral osteo.  Pharmacy has been consulted for vancomycin and cefepime dosing.  TBW is ~150 kg Give one time doses in the ED.  Plan: Continue cefepime 2g IV Q8h Continue vancomycin 1,250mg  IV Q8h Monitor clinical picture, renal function, VT at Css F/U C&S, abx deescalation / LOT    Temp (24hrs), Avg:98.8 F (37.1 C), Min:97.6 F (36.4 C), Max:99.8 F (37.7 C)  Recent Labs  Lab 04/30/17 2233  WBC 11.0*  CREATININE 0.74    CrCl cannot be calculated (Unknown ideal weight.).    Allergies  Allergen Reactions  . Ace Inhibitors Anaphylaxis  . Atenolol Anaphylaxis  . Lisinopril Anaphylaxis  . Prednisone Hives  . Ibuprofen Itching, Swelling and Other (See Comments)    Hand/feet swelling   . Tylenol [Acetaminophen] Other (See Comments)    Reaction:  GI bleeding     Thank you for allowing pharmacy to be a part of this patient's care.  Armandina StammerBATCHELDER,Savannaha Stonerock J 05/01/2017 12:49 PM

## 2017-05-01 NOTE — ED Triage Notes (Signed)
Pt. Requesting pain meds.

## 2017-05-01 NOTE — ED Notes (Signed)
[facility] called for transport. 

## 2017-05-01 NOTE — ED Provider Notes (Signed)
Imaging as below.  He does have evidence of vertebral osteomyelitis.  Results were discussed with patient.  Not floridly septic and no objective neuro findings on exam. Blood cultures obtained. Will defer from abx until decision for possible biopsy made. Will need admitted for ongoing w/u and initiation of treatment.    Dg Chest 2 View  Result Date: 05/01/2017 CLINICAL DATA:  Fever, shortness of breath, cough and congestion today. EXAM: CHEST  2 VIEW COMPARISON:  PA and lateral chest 04/13/2017.  CT chest 11/11/2016. FINDINGS: There is peribronchial thickening. No consolidative process, pneumothorax or effusion. Heart size is normal. No acute bony abnormality. IMPRESSION: Peribronchial thickening suggestive of bronchitis. Otherwise negative. Electronically Signed   By: Drusilla Kannerhomas  Dalessio M.D.   On: 05/01/2017 01:32   Dg Chest 2 View  Result Date: 04/13/2017 CLINICAL DATA:  Right-sided chest pain onset 1-1/2 hours ago. Thirty smoker. EXAM: CHEST  2 VIEW COMPARISON:  CT and CXR from 11/11/2016. FINDINGS: The heart size and mediastinal contours are within normal limits. Mild interstitial prominence bilaterally which may reflect smoking related bronchitic change. Both lungs otherwise unremarkable and free of pneumonic consolidations. The visualized skeletal structures are unremarkable. IMPRESSION: Increased interstitial prominence which may reflect smoking related bronchitic change. Electronically Signed   By: Tollie Ethavid  Kwon M.D.   On: 04/13/2017 18:33   Mr Thoracic Spine W Wo Contrast  Addendum Date: 05/01/2017   ADDENDUM REPORT: 05/01/2017 08:27 ADDENDUM: Study discussed by telephone with Dr. Juleen ChinaKohut in the ED on 05/01/2017 at 08:26 . Electronically Signed   By: Odessa FlemingH  Hall M.D.   On: 05/01/2017 08:27   Result Date: 05/01/2017 CLINICAL DATA:  42 year old male with history of IV drug use. Increasing back pain over the past 3 days associated with right lateral leg paresthesia. EXAM: MRI THORACIC AND LUMBAR SPINE WITHOUT  AND WITH CONTRAST TECHNIQUE: Multiplanar and multiecho pulse sequences of the thoracic and lumbar spine were obtained without and with intravenous contrast. CONTRAST:  20mL MULTIHANCE GADOBENATE DIMEGLUMINE 529 MG/ML IV SOLN COMPARISON:  CTA chest abdomen and pelvis without contrast 11/11/2016. CT Abdomen and Pelvis 10/14/2015. FINDINGS: MRI THORACIC SPINE FINDINGS Limited cervical spine imaging:  Grossly negative. Thoracic spine segmentation:  Appears normal. Alignment:  Normal thoracic vertebral height and alignment. Vertebrae: Mild chronic superior endplate deformities at T3 and T4 are without marrow edema. There is a tiny chronic Schmorl's nodes superiorly at T2 bone marrow signal is normal in the thoracic vertebrae T1. The T11 and T12 levels are abnormal, described below. Cord: Spinal cord signal is within normal limits at all visualized levels. No abnormal intradural enhancement. Paraspinal and other soft tissues: Normal except for the right lateral paraspinal soft tissues at T11 and T12, described below. Disc levels: No thoracic spinal stenosis above the T11 level aside from mild intermittent stenosis related to thoracic epidural lipomatosis. T11-T12: Abnormal anterior and right lateral endplate and vertebral body marrow edema with mild enhancement (series 8, image 5). Preexisting far lateral endplate osteophytes at this level, confirmed on the 2018 CTA. There is confluent soft tissue inflammation and enhancement surrounding the right lateral disc osteophyte complex (series 25, image 1 and series 8, image 1) with trace paraspinal fluid signal but no drainable paraspinal fluid collection. No definite abnormal fluid or enhancement within the disc. The soft tissue inflammation does affect the anterior aspect of the exiting right T11 nerve. The T11 and T12 posterior elements appear spared, and no posterior rib involvement is identified. No epidural inflammation. No dural thickening. The posterior paraspinal soft  tissues at this level remain normal. Furthermore, circumferential disc bulging at this level results in mild spinal stenosis. No spinal cord signal abnormality. MRI LUMBAR SPINE FINDINGS Segmentation:  Normal, concordant with the thoracic numbering today. Alignment: Stable mild straightening of lumbar lordosis and subtle retrolisthesis of L5 on S1. Vertebrae: Abnormal edema and enhancement in the right T11 and T12 vertebrae is well demonstrated on series 22, image 16. There is a benign vertebral body hemangioma in the L1. No lumbar or upper sacrum marrow edema or of acute osseous abnormality. Intact visible sacrum and SI joints. Conus medullaris: Mild spinal stenosis redemonstrated at T11-T12 corresponding to the lower thoracic cord level. The conus medullaris is at L1 and appears normal. No abnormal intradural enhancement. No dural thickening. Paraspinal and other soft tissues: Right lateral paraspinal phlegmon at T11-T12 redemonstrated and encompasses about 5 centimeters. As stated above, no definite fluid or enhancement of the right lateral disc. Trace paraspinal fluid signal but no discrete or drainable fluid collection. The lumbar paraspinal soft tissues are normal. The lumbar epidural space is normal. Cholelithiasis, otherwise negative visible abdominal viscera. Disc levels: Normal lumbar intervertebral disc signal and morphology except for degenerative changes: L4-L5: Mild disc desiccation and circumferential disc bulge. Mild to moderate facet hypertrophy greater on the right. No spinal stenosis. Mild L4 neural foraminal stenosis. L5-S1: Disc space loss. Moderate size broad-based left lateral recess disc extrusion (series 16, image 11 and series 19, image 40). Mild facet hypertrophy. Severe left lateral recess stenosis at the descending left S1 nerve level. Up to moderate left L5 neural foraminal stenosis. No contralateral stenosis. IMPRESSION: 1. Confluent inflammation within the right lateral T11 and T12  vertebral bodies, and in a 4-5 cm area of the lateral paraspinal soft tissues surrounding chronic degenerative endplate osteophytes at that level. No definite abnormality of the intervening disc, but in this clinical setting this constellation is more suspicious for Acute Thoracic Osteomyelitis with a paraspinal phlegmon, rather than reactive changes due to spine degeneration. There is no epidural or paraspinal abscess. No posterior rib or posterior element involvement identified. The inflammation does involve the course of the right T12 nerve. 2. No other acute or inflammatory process identified in the thoracic or lumbar spine. 3. Superimposed mild degenerative thoracic spinal stenosis at T11-T12 related to disc bulging. And in the lumbar spine there is a moderate size herniation of the L5-S1 disc into the left lateral recess severely affecting the course of the left S1 nerve. Electronically Signed: By: Odessa Fleming M.D. On: 05/01/2017 08:19   Mr Lumbar Spine W Wo Contrast  Addendum Date: 05/01/2017   ADDENDUM REPORT: 05/01/2017 08:27 ADDENDUM: Study discussed by telephone with Dr. Juleen China in the ED on 05/01/2017 at 08:26 . Electronically Signed   By: Odessa Fleming M.D.   On: 05/01/2017 08:27   Result Date: 05/01/2017 CLINICAL DATA:  42 year old male with history of IV drug use. Increasing back pain over the past 3 days associated with right lateral leg paresthesia. EXAM: MRI THORACIC AND LUMBAR SPINE WITHOUT AND WITH CONTRAST TECHNIQUE: Multiplanar and multiecho pulse sequences of the thoracic and lumbar spine were obtained without and with intravenous contrast. CONTRAST:  20mL MULTIHANCE GADOBENATE DIMEGLUMINE 529 MG/ML IV SOLN COMPARISON:  CTA chest abdomen and pelvis without contrast 11/11/2016. CT Abdomen and Pelvis 10/14/2015. FINDINGS: MRI THORACIC SPINE FINDINGS Limited cervical spine imaging:  Grossly negative. Thoracic spine segmentation:  Appears normal. Alignment:  Normal thoracic vertebral height and alignment.  Vertebrae: Mild chronic superior endplate deformities at T3  and T4 are without marrow edema. There is a tiny chronic Schmorl's nodes superiorly at T2 bone marrow signal is normal in the thoracic vertebrae T1. The T11 and T12 levels are abnormal, described below. Cord: Spinal cord signal is within normal limits at all visualized levels. No abnormal intradural enhancement. Paraspinal and other soft tissues: Normal except for the right lateral paraspinal soft tissues at T11 and T12, described below. Disc levels: No thoracic spinal stenosis above the T11 level aside from mild intermittent stenosis related to thoracic epidural lipomatosis. T11-T12: Abnormal anterior and right lateral endplate and vertebral body marrow edema with mild enhancement (series 8, image 5). Preexisting far lateral endplate osteophytes at this level, confirmed on the 2018 CTA. There is confluent soft tissue inflammation and enhancement surrounding the right lateral disc osteophyte complex (series 25, image 1 and series 8, image 1) with trace paraspinal fluid signal but no drainable paraspinal fluid collection. No definite abnormal fluid or enhancement within the disc. The soft tissue inflammation does affect the anterior aspect of the exiting right T11 nerve. The T11 and T12 posterior elements appear spared, and no posterior rib involvement is identified. No epidural inflammation. No dural thickening. The posterior paraspinal soft tissues at this level remain normal. Furthermore, circumferential disc bulging at this level results in mild spinal stenosis. No spinal cord signal abnormality. MRI LUMBAR SPINE FINDINGS Segmentation:  Normal, concordant with the thoracic numbering today. Alignment: Stable mild straightening of lumbar lordosis and subtle retrolisthesis of L5 on S1. Vertebrae: Abnormal edema and enhancement in the right T11 and T12 vertebrae is well demonstrated on series 22, image 16. There is a benign vertebral body hemangioma in the  L1. No lumbar or upper sacrum marrow edema or of acute osseous abnormality. Intact visible sacrum and SI joints. Conus medullaris: Mild spinal stenosis redemonstrated at T11-T12 corresponding to the lower thoracic cord level. The conus medullaris is at L1 and appears normal. No abnormal intradural enhancement. No dural thickening. Paraspinal and other soft tissues: Right lateral paraspinal phlegmon at T11-T12 redemonstrated and encompasses about 5 centimeters. As stated above, no definite fluid or enhancement of the right lateral disc. Trace paraspinal fluid signal but no discrete or drainable fluid collection. The lumbar paraspinal soft tissues are normal. The lumbar epidural space is normal. Cholelithiasis, otherwise negative visible abdominal viscera. Disc levels: Normal lumbar intervertebral disc signal and morphology except for degenerative changes: L4-L5: Mild disc desiccation and circumferential disc bulge. Mild to moderate facet hypertrophy greater on the right. No spinal stenosis. Mild L4 neural foraminal stenosis. L5-S1: Disc space loss. Moderate size broad-based left lateral recess disc extrusion (series 16, image 11 and series 19, image 40). Mild facet hypertrophy. Severe left lateral recess stenosis at the descending left S1 nerve level. Up to moderate left L5 neural foraminal stenosis. No contralateral stenosis. IMPRESSION: 1. Confluent inflammation within the right lateral T11 and T12 vertebral bodies, and in a 4-5 cm area of the lateral paraspinal soft tissues surrounding chronic degenerative endplate osteophytes at that level. No definite abnormality of the intervening disc, but in this clinical setting this constellation is more suspicious for Acute Thoracic Osteomyelitis with a paraspinal phlegmon, rather than reactive changes due to spine degeneration. There is no epidural or paraspinal abscess. No posterior rib or posterior element involvement identified. The inflammation does involve the course  of the right T12 nerve. 2. No other acute or inflammatory process identified in the thoracic or lumbar spine. 3. Superimposed mild degenerative thoracic spinal stenosis at T11-T12 related to  disc bulging. And in the lumbar spine there is a moderate size herniation of the L5-S1 disc into the left lateral recess severely affecting the course of the left S1 nerve. Electronically Signed: By: Odessa Fleming M.D. On: 05/01/2017 08:19     Raeford Razor, MD 05/01/17 [phone]

## 2017-05-02 ENCOUNTER — Encounter (HOSPITAL_COMMUNITY): Payer: Self-pay | Admitting: *Deleted

## 2017-05-02 ENCOUNTER — Inpatient Hospital Stay (HOSPITAL_COMMUNITY): Payer: Medicare Other

## 2017-05-02 DIAGNOSIS — E669 Obesity, unspecified: Secondary | ICD-10-CM

## 2017-05-02 DIAGNOSIS — Z8673 Personal history of transient ischemic attack (TIA), and cerebral infarction without residual deficits: Secondary | ICD-10-CM

## 2017-05-02 DIAGNOSIS — Z79891 Long term (current) use of opiate analgesic: Secondary | ICD-10-CM

## 2017-05-02 DIAGNOSIS — M4644 Discitis, unspecified, thoracic region: Secondary | ICD-10-CM

## 2017-05-02 DIAGNOSIS — B182 Chronic viral hepatitis C: Secondary | ICD-10-CM

## 2017-05-02 DIAGNOSIS — F1190 Opioid use, unspecified, uncomplicated: Secondary | ICD-10-CM

## 2017-05-02 DIAGNOSIS — M545 Low back pain, unspecified: Secondary | ICD-10-CM

## 2017-05-02 DIAGNOSIS — Z886 Allergy status to analgesic agent status: Secondary | ICD-10-CM

## 2017-05-02 DIAGNOSIS — M4620 Osteomyelitis of vertebra, site unspecified: Secondary | ICD-10-CM

## 2017-05-02 DIAGNOSIS — E785 Hyperlipidemia, unspecified: Secondary | ICD-10-CM

## 2017-05-02 DIAGNOSIS — G8929 Other chronic pain: Secondary | ICD-10-CM

## 2017-05-02 DIAGNOSIS — M21371 Foot drop, right foot: Secondary | ICD-10-CM

## 2017-05-02 DIAGNOSIS — E876 Hypokalemia: Secondary | ICD-10-CM

## 2017-05-02 DIAGNOSIS — Z79899 Other long term (current) drug therapy: Secondary | ICD-10-CM

## 2017-05-02 DIAGNOSIS — F319 Bipolar disorder, unspecified: Secondary | ICD-10-CM

## 2017-05-02 DIAGNOSIS — Z888 Allergy status to other drugs, medicaments and biological substances status: Secondary | ICD-10-CM

## 2017-05-02 DIAGNOSIS — F1490 Cocaine use, unspecified, uncomplicated: Secondary | ICD-10-CM

## 2017-05-02 DIAGNOSIS — I110 Hypertensive heart disease with heart failure: Secondary | ICD-10-CM

## 2017-05-02 DIAGNOSIS — E119 Type 2 diabetes mellitus without complications: Secondary | ICD-10-CM

## 2017-05-02 DIAGNOSIS — I5032 Chronic diastolic (congestive) heart failure: Secondary | ICD-10-CM

## 2017-05-02 DIAGNOSIS — F1390 Sedative, hypnotic, or anxiolytic use, unspecified, uncomplicated: Secondary | ICD-10-CM

## 2017-05-02 DIAGNOSIS — H02401 Unspecified ptosis of right eyelid: Secondary | ICD-10-CM

## 2017-05-02 DIAGNOSIS — K5900 Constipation, unspecified: Secondary | ICD-10-CM

## 2017-05-02 DIAGNOSIS — F17210 Nicotine dependence, cigarettes, uncomplicated: Secondary | ICD-10-CM

## 2017-05-02 DIAGNOSIS — E1140 Type 2 diabetes mellitus with diabetic neuropathy, unspecified: Secondary | ICD-10-CM

## 2017-05-02 DIAGNOSIS — G894 Chronic pain syndrome: Secondary | ICD-10-CM

## 2017-05-02 LAB — BASIC METABOLIC PANEL
ANION GAP: 9 (ref 5–15)
BUN: 10 mg/dL (ref 6–20)
CHLORIDE: 105 mmol/L (ref 101–111)
CO2: 22 mmol/L (ref 22–32)
Calcium: 8.8 mg/dL — ABNORMAL LOW (ref 8.9–10.3)
Creatinine, Ser: 0.81 mg/dL (ref 0.61–1.24)
GFR calc Af Amer: >60 mL/min (ref >60)
GFR calc non Af Amer: >60 mL/min (ref >60)
Glucose, Bld: 116 mg/dL — ABNORMAL HIGH (ref 65–99)
POTASSIUM: 5.7 mmol/L — AB (ref 3.5–5.1)
Sodium: 136 mmol/L (ref 135–145)

## 2017-05-02 LAB — ECHOCARDIOGRAM COMPLETE
HEIGHTINCHES: 73.5 in
Weight: 5739.01 oz

## 2017-05-02 LAB — CBC
HEMATOCRIT: 44.1 % (ref 39.0–52.0)
Hemoglobin: 14.3 g/dL (ref 13.0–17.0)
MCH: 29.9 pg (ref 26.0–34.0)
MCHC: 32.4 g/dL (ref 30.0–36.0)
MCV: 92.3 fL (ref 78.0–100.0)
Platelets: 212 10*3/uL (ref 150–400)
RBC: 4.78 MIL/uL (ref 4.22–5.81)
RDW: 13.5 % (ref 11.5–15.5)
WBC: 6.2 10*3/uL (ref 4.0–10.5)

## 2017-05-02 LAB — GLUCOSE, CAPILLARY
GLUCOSE-CAPILLARY: 140 mg/dL — AB (ref 65–99)
Glucose-Capillary: 203 mg/dL — ABNORMAL HIGH (ref 65–99)
Glucose-Capillary: 96 mg/dL (ref 65–99)
Glucose-Capillary: 141 mg/dL — ABNORMAL HIGH (ref 65–99)

## 2017-05-02 MED ORDER — SODIUM CHLORIDE 0.9 % IV SOLN
2.0000 g | INTRAVENOUS | Status: DC
  Administered 2017-05-02 – 2017-05-04 (×3): 2 g via INTRAVENOUS
  Filled 2017-05-02 (×3): qty 20

## 2017-05-02 MED ORDER — SODIUM CHLORIDE 0.9% FLUSH
10.0000 mL | Freq: Two times a day (BID) | INTRAVENOUS | Status: DC
  Administered 2017-05-02 – 2017-05-04 (×4): 10 mL

## 2017-05-02 MED ORDER — PERFLUTREN LIPID MICROSPHERE
1.0000 mL | INTRAVENOUS | Status: AC | PRN
  Administered 2017-05-02: 4 mL via INTRAVENOUS
  Filled 2017-05-02: qty 10

## 2017-05-02 MED ORDER — ZIPRASIDONE HCL 20 MG PO CAPS
20.0000 mg | ORAL_CAPSULE | Freq: Two times a day (BID) | ORAL | Status: DC
  Administered 2017-05-02 – 2017-05-04 (×6): 20 mg via ORAL
  Filled 2017-05-02 (×6): qty 1

## 2017-05-02 MED ORDER — SODIUM CHLORIDE 0.9% FLUSH: 10.0000 mL | INTRAVENOUS | Status: DC | PRN

## 2017-05-02 MED ORDER — FUROSEMIDE 40 MG PO TABS
40.0000 mg | ORAL_TABLET | Freq: Every day | ORAL | Status: DC
Start: 2017-05-02 — End: 2017-05-04
  Administered 2017-05-03 – 2017-05-04 (×2): 40 mg via ORAL
  Filled 2017-05-02 (×2): qty 1

## 2017-05-02 MED ORDER — OXYCODONE HCL 5 MG PO TABS
10.0000 mg | ORAL_TABLET | Freq: Three times a day (TID) | ORAL | Status: DC
  Administered 2017-05-02 – 2017-05-04 (×8): 10 mg via ORAL
  Filled 2017-05-02 (×8): qty 2

## 2017-05-02 NOTE — H&P (Signed)
[facility]    Date of Admission:  04/30/2017     Total days of antibiotics                Reason for Consult: Osteomyelitis / Discitis    Referring Provider: Cyndie Chime Primary Care Provider: Patient, No Pcp Per   Assessment/Plan:  T11-T12 Osteomyelitis - 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 He has remained afebrile with no leukocytosis. Blood and surgical cultures remain in process. Agree with vancomycin. Will change cefepime to ceftriaxone. Continue to monitor culture results. He does not appear to be a good candidate for a PICC line at present time.   Chronic Hepatitis C - Previous blood work completed on 01/14/17 showed a Genotype of 1a and viral load of 8,470,000. Appears stable currently with no symptoms. Recommend outpatient treatment when stable.   Depression - Appears stable currently with no suicidal ideations. He indicates that he does have good support systems around him. There is concern that his depression can lead to relapse. Continue Geodon and Trazodone with changes per primary team.    Principal Problem:   Vertebral osteomyelitis (HCC) Active Problems:   IVDU (intravenous drug user)   . gabapentin  800 mg Oral TID  . insulin aspart  0-9 Units Subcutaneous TID WC  . oxyCODONE  10 mg Oral Q8H  . pantoprazole  40 mg Oral Daily  . ziprasidone  20 mg Oral BID WC     HPI: Bryan Gallegos is a 42 y.o. male with a history of diabetes, dyslipidemia, obesity, and bipolar disorder presented to the ED with the chief complain of back pain indicating that "I cannot walk."   Described worsening back pain starting 3 days prior to arrival described as sharp and burning. He takes 30 mg of oxycodone daily which did not help with his pain. Initially seen by his Pain Medicine provider and was advised to come to the hospital because his blood work showed infection. He did have a 101.2 fever  the day prior to presentation.    MRI imaging of his back with confluent inflammation within the right lateral T11 and T12 vertebral bodies and in a 4-5 cm area of the lateral paraspinal soft tissue surrounding chronic dengenerative endplate osteophytes. There were no definite abnormality of the intervening disc nor epidural/paraspinal abscess. A CT guided aspiration of the phelgmonous tissue adjacent to the right-sided T11-T12 disc osteophyte was performed on 3/3 with no organisms seen on gram stain and cultures pending.   Symptoms of increased low back pain started about 1 week prior to presentation. He thinks he may have had a fever as well but did not measure it. He has used IV drugs, primarily cocaine, in the past with his last time being January 8th on the anniversary of his wife's death which is a trigger for him. States he always used clean needles and has never re-used needles before. 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 Describes that pain associated with his current situation as the worst pain that he has ever had. Currently the pain is adequately controlled.    Review of Systems: Review of Systems  Constitutional: Positive for weight loss. Negative for chills, diaphoresis and fever.  Respiratory: Negative for cough, shortness of breath and wheezing.   Cardiovascular: Negative for chest pain and leg swelling.  Gastrointestinal: Positive for constipation. Negative  for diarrhea, melena, nausea and vomiting.  Musculoskeletal: Positive for back pain.  Skin: Negative for rash.  Neurological: Negative for weakness.     Past Medical History:  Diagnosis Date  . Adult ADHD   . Anaphylactic reaction   . Anxiety   . Asthma   . Bipolar disorder (HCC)   . Complication of anesthesia    Per patient difficult intubation;  . Diabetes mellitus   . Difficult intubation    Per patient  . Drug overdose   . Heart valve  disorder    s/p echocardiogram  . Hyperlipidemia   . Hypertension   . Morbidly obese (HCC)   . OSA (obstructive sleep apnea)   . Polysubstance abuse (HCC)   . Transient cerebral ischemia    Unknown    Social History   Tobacco Use  . Smoking status: Current Every Day Smoker    Packs/day: 1.00    Years: 23.00    Pack years: 23.00    Types: Cigarettes  . Smokeless tobacco: Never Used  Substance Use Topics  . Alcohol use: No    Alcohol/week: 0.0 oz  . Drug use: Yes    Types: Benzodiazepines, Heroin, Cocaine    Family History  Problem Relation Age of Onset  . CAD Mother        Living  . Diabetes Mellitus II Mother   . Stroke Mother   . Hypertension Mother   . Congestive Heart Failure Mother   . Kidney disease Mother   . Fibromyalgia Mother   . Thyroid disease Mother   . Hyperlipidemia Mother   . Liver disease Mother   . Alcoholism Father [id number]       Deceased  . Arthritis Maternal Grandmother   . Congestive Heart Failure Maternal Grandmother   . Hypertension Maternal Grandmother   . Lung cancer Maternal Grandfather   . Colon cancer Maternal Aunt   . Stomach cancer Maternal Aunt   . Heart disease Other        Paternal & Maternal  . Crohn's disease Other   . Hypertension Other        Paternal & Maternal  . Hypertension Brother        x3  . Hypertension Sister        #1  . Bipolar disorder Sister        #1  . ADD / ADHD Son        x3  . Bipolar disorder Son        x3  . Asperger's syndrome Son     Allergies  Allergen Reactions  . Ace Inhibitors Anaphylaxis  . Atenolol Anaphylaxis  . Lisinopril Anaphylaxis  . Prednisone Hives  . Ibuprofen Itching, Swelling and Other (See Comments)    Hand/feet swelling   . Tylenol [Acetaminophen] Other (See Comments)    Reaction:  GI bleeding     OBJECTIVE: Blood pressure (!) 150/76, pulse 85, temperature 98.2 F (36.8 C), temperature source Oral, resp. rate 16, height 6' 1.5" (1.867 m), weight (!) 358 lb 11 oz  (162.7 kg), SpO2 95 %.  Physical Exam  Constitutional: He is oriented to person, place, and time.  Obese male lying in bed in no distress; pleasant.   Neck: Neck supple.  Cardiovascular: Normal rate, regular rhythm, normal heart sounds and intact distal pulses. Exam reveals no gallop and no friction rub.  No murmur heard. Pulmonary/Chest: No respiratory distress. He has no wheezes. He has no rales. He exhibits no tenderness.  Abdominal: Soft. Bowel sounds are normal. He exhibits no distension and no mass. There is no tenderness. There is no rebound and no guarding.  Lymphadenopathy:    He has no cervical adenopathy.  Neurological: He is alert and oriented to person, place, and time.  Skin: Skin is warm and dry. No rash noted.  Psychiatric: Mood, memory, affect and judgment normal.    Lab Results Lab Results  Component Value Date   WBC 6.2 05/02/2017   HGB 14.3 05/02/2017   HCT 44.1 05/02/2017   MCV 92.3 05/02/2017   PLT 212 05/02/2017    Lab Results  Component Value Date   CREATININE 0.81 05/02/2017   BUN 10 05/02/2017   NA 136 05/02/2017   K 5.7 (H) 05/02/2017   CL 105 05/02/2017   CO2 22 05/02/2017    Lab Results  Component Value Date   ALT 77 (H) 04/30/2017   AST 58 (H) 04/30/2017   ALKPHOS 82 04/30/2017   BILITOT 0.8 04/30/2017     Microbiology: Recent Results (from the past 240 hour(s))  Aerobic/Anaerobic Culture (surgical/deep wound)     Status: None (Preliminary result)   Collection Time: 05/01/17 12:39 PM  Result Value Ref Range Status   Specimen Description NEEDLE ASPIRATE PARA CSF T11 T12  Final   Special Requests Normal  Final   Gram Stain   Final    RARE WBC PRESENT, PREDOMINANTLY MONONUCLEAR NO ORGANISMS SEEN Performed at [facility], [address] [place], [place] [address]    Culture PENDING  Incomplete   Report Status PENDING  Incomplete  MRSA PCR Screening     Status: None   Collection Time: 05/01/17  2:20 PM  Result Value Ref Range  Status   MRSA by PCR NEGATIVE NEGATIVE Final    Comment:        The GeneXpert MRSA Assay (FDA approved for NASAL specimens only), is one component of a comprehensive MRSA colonization surveillance program. It is not intended to diagnose MRSA infection nor to guide or monitor treatment for MRSA infections. Performed at [facility], [address], [place], [place] [address]      Marcos Eke, NP [facility] [phone] Pager  05/02/2017  10:25 AM

## 2017-05-02 NOTE — Progress Notes (Addendum)
Subjective: [name] was seen resting comfortably in his bed this morning. He stated that his back pain was well controlled. He continues to have some right lower extremity weakness.   Objective:  Vital signs in last 24 hours: Vitals:   05/01/17 1221 05/01/17 1322 05/01/17 2216 05/02/17 0745  BP: (!) 159/95 (!) 159/110 (!) 159/85 (!) 150/76  Pulse: 79 79 73 85  Resp: 18 18 18 16   Temp:  98 F (36.7 C) 97.9 F (36.6 C) 98.2 F (36.8 C)  TempSrc:  Oral Oral Oral  SpO2: 97% 96% 93% 95%  Weight:  (!) 341 lb 0 oz (154.7 kg)  (!) 358 lb 11 oz (162.7 kg)  Height:  6' 1.5" (1.867 m)     Physical Exam  Constitutional: He appears well-developed and well-nourished. No distress.  HENT:  Head: Normocephalic and atraumatic.  Eyes: Conjunctivae are normal.  Ptosis noted in right eye  Cardiovascular: Normal rate, regular rhythm and normal heart sounds.  Respiratory: Effort normal and breath sounds normal. No respiratory distress. He has no wheezes.  GI: Soft. Bowel sounds are normal. He exhibits no distension. There is no tenderness.  Musculoskeletal: He exhibits no edema.  3/5 right lower extremity strength, 4/5 left lower extremity strength  Neurological: He is alert. No cranial nerve deficit.  Right foot drop  Skin: He is not diaphoretic.  Psychiatric: He has a normal mood and affect. His behavior is normal. Judgment and thought content normal.    Assessment/Plan:  [name] is a 42 y.o male with essential hypertension, diabetes mellitus type 2, history of IV drug use, TIA, opioid use disorder, major depressive disorder, bipolar disorder, chronic pain syndrome, morbid obesity who presents with back pain. Afebrile on admission with mild leukocytosis of 11. MRI thoracic and lumbar spine showed findings consistent for osteomyelitis. Started treatment with IV vancomycin and cefepime.   Thoracic Spine Osteomyelitis  Reviewed MRI films with neuroradiologist who confirmed that there is  some edema noted in the T11 and T12 vertebral bodies with some accompanied prevertebral inflammation. The inflammation is thought to be of infectious process given the IVDU history.   -IR did Ct guided aspiration (05/01/17) of right paraspinal plegmon at T11-T12 area and took out <1cc of fluid that was sent to culture. Culture does not show organisms thus far. -Patient continues to be afebrile and no leukocytosis present (wbc=6.2).  -Received  2 doses cefepime and 3 doses vancomycin thusfar post aspiration of phlegmon. Vancomycin and cefepime were chosen for MRSA, gram positive, and gram negative coverage. -consulted Infectious Disease specialists to help with antibiotic choice and duration of therapy.  -Blood culture pending -Aerobic and anaerobic culture pending -Echocardiogram pending to evaluate for possible septic thrombus -Daily bmp -Dilaudid 1mg  q2hrs prn -Ocycodone 10mg  qhrs  Chronic Diastolic Heart Failure with preserved EF The patient appears euvolemic today.   -Continuing to hold lasix   Hypokalemia The patient's potassium was 5.7 this morning from previous 4.2.  -Will repeat bmp  Diabetes Mellitus Type 2 Patient's HbA1c=5.3 (05/01/17). Blood glucose over the past 24 hrs has ranged 116-203.  -Will continue to monitor patient's blood glucose and recommend good lifestyle modifications -continue gabapentin 800mg  tid for neuropathy  Essential hypertension The patient's blood pressure this morning was 150/76.   -Continue to monitor as the patient is in acute pain  Major depressive disorder Bipolar disorder  -continue trazodone 150mg  qhs -continue geodon 20mg  bid with meals  Hyperlipidemia -Continue atorvastatin 20 mg nightly  Dispo: Anticipated discharge  in approximately 1-2 day(s).   Lorenso Courier, MD 05/02/2017, 10:06 AM Pager: [phone]

## 2017-05-02 NOTE — Progress Notes (Signed)
Medicine attending: I examined this patient today together with resident physician Dr. Lorenso CourierVahini Chundi and I concur with her evaluation and management plan which we discussed together.  We reviewed the MRI of the spine with our neuroradiologist.  No evidence for a epidural/paraspinal abscess however there are significant inflammatory changes at T11-T12 which in the context of his history of IV drug use are suspicious for osteomyelitis.  No evidence for discitis. Material has been aspirated from the area of concern and cultures are pending.  Empiric antibiotic started.  We will involve our infectious disease team for further advice on management.

## 2017-05-02 NOTE — Progress Notes (Signed)
ANTIBIOTIC CONSULT NOTE - INITIAL  Pharmacy Consult for Rocephin Indication: osteo  Allergies  Allergen Reactions  . Ace Inhibitors Anaphylaxis  . Atenolol Anaphylaxis  . Lisinopril Anaphylaxis  . Prednisone Hives  . Ibuprofen Itching, Swelling and Other (See Comments)    Hand/feet swelling   . Tylenol [Acetaminophen] Other (See Comments)    Reaction:  GI bleeding     Patient Measurements: Height: 6' 1.5" (186.7 cm) Weight: (!) 358 lb 11 oz (162.7 kg) IBW/kg (Calculated) : 81.05 Adjusted Body Weight:    Vital Signs: Temp: 98.2 F (36.8 C) (03/04 0745) Temp Source: Oral (03/04 0745) BP: 150/76 (03/04 0745) Pulse Rate: 85 (03/04 0745) Intake/Output from previous day: 03/03 0701 - 03/04 0700 In: 500 [IV Piggyback:500] Out: -  Intake/Output from this shift: Total I/O In: 480 [P.O.:480] Out: 1400 [Urine:1400]  Labs: Recent Labs    04/30/17 2233 05/02/17 0648  WBC 11.0* 6.2  HGB 14.2 14.3  PLT 215 212  CREATININE 0.74 0.81   Estimated Creatinine Clearance: 189.1 mL/min (by C-G formula based on SCr of 0.81 mg/dL). No results for input(s): VANCOTROUGH, VANCOPEAK, VANCORANDOM, GENTTROUGH, GENTPEAK, GENTRANDOM, TOBRATROUGH, TOBRAPEAK, TOBRARND, AMIKACINPEAK, AMIKACINTROU, AMIKACIN in the last 72 hours.   Microbiology:   Medical History: Past Medical History:  Diagnosis Date  . Adult ADHD   . Anaphylactic reaction   . Anxiety   . Asthma   . Bipolar disorder (HCC)   . Complication of anesthesia    Per patient difficult intubation;  . Diabetes mellitus   . Difficult intubation    Per patient  . Drug overdose   . Heart valve disorder    s/p echocardiogram  . Hyperlipidemia   . Hypertension   . Morbidly obese (HCC)   . OSA (obstructive sleep apnea)   . Polysubstance abuse (HCC)   . Transient cerebral ischemia    Unknown   Assessment: ID: Vertebral osteo. Afebrile. WBC 11>6.2. Scr WNL. - 3/3 MRI: Confluent inflammation in the T11-T12 vertebral bodies  and laterals paraspinal soft tissue that is concerning for acute thoracic osteomyelitis.  3/3 Vanco>> 3/3 Cefepime>>3/4 3/4 Rocephin>>  3/3: Needle aspirate>>  Goal of Therapy:  Eradication of infection  Plan:  D/c Cefepime Start Rocephin 2g IV q 24h Continue vancomycin 1,250mg  IV Q8h. Level Tuesday  Bryan Gallegos S. Merilynn Finlandobertson, PharmD, BCPS Clinical Staff Pharmacist Pager [phone]  Bryan Gallegos, Bryan Gallegos 05/02/2017,12:39 PM

## 2017-05-02 NOTE — Progress Notes (Signed)
[facility] Coord[facility] will follow Bryan Gallegos with the ID Team to support OPAT pharmacy services if needed at DC to home.  If patient discharges after hours, please call [phone].   Bryan Gallegos 05/02/2017, 10:23 AM

## 2017-05-03 DIAGNOSIS — G4730 Sleep apnea, unspecified: Secondary | ICD-10-CM

## 2017-05-03 DIAGNOSIS — M4624 Osteomyelitis of vertebra, thoracic region: Principal | ICD-10-CM

## 2017-05-03 DIAGNOSIS — B1920 Unspecified viral hepatitis C without hepatic coma: Secondary | ICD-10-CM

## 2017-05-03 DIAGNOSIS — Z9989 Dependence on other enabling machines and devices: Secondary | ICD-10-CM

## 2017-05-03 DIAGNOSIS — Z5181 Encounter for therapeutic drug level monitoring: Secondary | ICD-10-CM

## 2017-05-03 LAB — BASIC METABOLIC PANEL
ANION GAP: 10 (ref 5–15)
BUN: 13 mg/dL (ref 6–20)
CHLORIDE: 100 mmol/L — AB (ref 101–111)
CO2: 27 mmol/L (ref 22–32)
Calcium: 9 mg/dL (ref 8.9–10.3)
Creatinine, Ser: 0.99 mg/dL (ref 0.61–1.24)
GFR calc Af Amer: >60 mL/min (ref >60)
GFR calc non Af Amer: >60 mL/min (ref >60)
GLUCOSE: 120 mg/dL — AB (ref 65–99)
POTASSIUM: 4.1 mmol/L (ref 3.5–5.1)
Sodium: 137 mmol/L (ref 135–145)

## 2017-05-03 LAB — GLUCOSE, CAPILLARY
GLUCOSE-CAPILLARY: 132 mg/dL — AB (ref 65–99)
GLUCOSE-CAPILLARY: 115 mg/dL — AB (ref 65–99)
Glucose-Capillary: 142 mg/dL — ABNORMAL HIGH (ref 65–99)
Glucose-Capillary: 129 mg/dL — ABNORMAL HIGH (ref 65–99)

## 2017-05-03 LAB — CBC
HEMATOCRIT: 41.6 % (ref 39.0–52.0)
Hemoglobin: 13.7 g/dL (ref 13.0–17.0)
MCH: 30.3 pg (ref 26.0–34.0)
MCHC: 32.9 g/dL (ref 30.0–36.0)
MCV: 92 fL (ref 78.0–100.0)
Platelets: 204 10*3/uL (ref 150–400)
RBC: 4.52 MIL/uL (ref 4.22–5.81)
RDW: 13.2 % (ref 11.5–15.5)
WBC: 8 10*3/uL (ref 4.0–10.5)

## 2017-05-03 LAB — VANCOMYCIN, TROUGH: VANCOMYCIN TR: 17 ug/mL (ref 15–20)

## 2017-05-03 LAB — HIV ANTIBODY (ROUTINE TESTING W REFLEX): HIV Screen 4th Generation wRfx: NONREACTIVE

## 2017-05-03 MED ORDER — IBUPROFEN 200 MG PO TABS: 400.0000 mg | ORAL_TABLET | ORAL | Status: DC | PRN

## 2017-05-03 NOTE — Progress Notes (Signed)
[facility]  Date of Admission:  04/30/2017        ASSESSMENT/PLAN  T11/T12 Osteomyelitis - Appears stable with current dosage of vancomycin and ceftriaxone. Surgical and blood cultures remain with no growth to date. Afebrile with no leukocytosis.   1. Continue Vancomycin and ceftriaxone for empiric coverage pending culture results.  2. Will consider PICC line if culture remain clear for the next 24-48 hours. 3. Will require at least 6 weeks of IV therapy.   Chronic Hepatitis C - Discussed today and will plan for follow up as outpatient.   Therapeutic Drug Monitoring - Small continued increase in creatinine with kidney function remaining stable. Continue to monitor for additional increase with consideration for antibiotic change to daptomycin if needed.   Sleep Apnea - Uses BiPap at home. Encouraged to speak with primary team regarding continuation of therapy while in the hospital.   Principal Problem:   Vertebral osteomyelitis (HCC) Active Problems:   Reactive depression   IVDU (intravenous drug user)   Acute midline low back pain   Bipolar affective disorder (HCC)   . furosemide  40 mg Oral Daily  . gabapentin  800 mg Oral TID  . insulin aspart  0-9 Units Subcutaneous TID WC  . oxyCODONE  10 mg Oral Q8H  . pantoprazole  40 mg Oral Daily  . sodium chloride flush  10-40 mL Intracatheter Q12H  . ziprasidone  20 mg Oral BID WC    SUBJECTIVE:  Afebrile overnight with no leukocytosis. Continues to receive vancomycin and ceftriaxone. Surgical cultures remain with no growth to date x 1 day. He had a midline IV placed yesterday.   Continues to have pain located in his back and weakness of his right lower extremity. Current severity of the pain is 10/10 and is awaiting pain medication.  Allergies  Allergen Reactions  . Ace Inhibitors Anaphylaxis  . Atenolol Anaphylaxis  . Lisinopril Anaphylaxis  . Prednisone Hives  . Ibuprofen Itching, Swelling  and Other (See Comments)    Hand/feet swelling   . Tylenol [Acetaminophen] Other (See Comments)    Reaction:  GI bleeding      Review of Systems: Review of Systems  Constitutional: Negative for chills, diaphoresis, fever and weight loss.  Respiratory: Negative for cough, shortness of breath and wheezing.   Cardiovascular: Negative for chest pain.  Gastrointestinal: Negative for abdominal pain, constipation, diarrhea and nausea.  Musculoskeletal: Positive for back pain.      OBJECTIVE: Vitals:   05/02/17 1300 05/02/17 1920 05/03/17 0417 05/03/17 0500  BP: (!) 154/75 (!) 152/85 (!) 140/96   Pulse: 84 87 91   Resp: 16 16 16    Temp: 98.2 F (36.8 C) 98.2 F (36.8 C) 98.5 F (36.9 C)   TempSrc: Oral Oral Oral   SpO2: 96% 97% 100%   Weight:    (!) 363 lb 8.6 oz (164.9 kg)  Height:       Body mass index is 47.31 kg/m.  Physical Exam  Constitutional: He is well-developed, well-nourished, and in no distress. No distress.  Lying in bed sleeping upon arrival with snoring and periods of apnea. Aroused to verbal stimuli.     Lab Results Lab Results  Component Value Date   WBC 8.0 05/03/2017   HGB 13.7 05/03/2017   HCT 41.6 05/03/2017   MCV 92.0 05/03/2017   PLT 204 05/03/2017    Lab Results  Component Value Date   CREATININE 0.99 05/03/2017   BUN 13 05/03/2017  NA 137 05/03/2017   K 4.1 05/03/2017   CL 100 (L) 05/03/2017   CO2 27 05/03/2017    Lab Results  Component Value Date   ALT 77 (H) 04/30/2017   AST 58 (H) 04/30/2017   ALKPHOS 82 04/30/2017   BILITOT 0.8 04/30/2017     Microbiology: Recent Results (from the past 240 hour(s))  Culture, blood (Routine X 2) w Reflex to ID Panel     Status: None (Preliminary result)   Collection Time: 04/30/17 10:33 PM  Result Value Ref Range Status   Specimen Description   Final    BLOOD LEFT ARM Performed at [facility], [address] [place], [place] [address]    Special Requests   Final     IN PEDIATRIC BOTTLE Blood Culture adequate volume Performed at [facility], [address] [place], [place] [address]    Culture   Final    NO GROWTH 1 DAY Performed at [facility], [address], [place], [place] [address]    Report Status PENDING  Incomplete  Aerobic/Anaerobic Culture (surgical/deep wound)     Status: None (Preliminary result)   Collection Time: 05/01/17 12:39 PM  Result Value Ref Range Status   Specimen Description NEEDLE ASPIRATE PARA CSF T11 T12  Final   Special Requests Normal  Final   Gram Stain   Final    RARE WBC PRESENT, PREDOMINANTLY MONONUCLEAR NO ORGANISMS SEEN    Culture   Final    NO GROWTH 1 DAY Performed at [facility], [address] [place], [place] [address]    Report Status PENDING  Incomplete  MRSA PCR Screening     Status: None   Collection Time: 05/01/17  2:20 PM  Result Value Ref Range Status   MRSA by PCR NEGATIVE NEGATIVE Final    Comment:        The GeneXpert MRSA Assay (FDA approved for NASAL specimens only), is one component of a comprehensive MRSA colonization surveillance program. It is not intended to diagnose MRSA infection nor to guide or monitor treatment for MRSA infections. Performed at [facility], [address], [place], [place] [address]      Marcos Eke, NP [facility] [phone] Pager  05/03/2017  9:44 AM

## 2017-05-03 NOTE — Progress Notes (Addendum)
Subjective: [name] was seen laying in his bed this morning. He states that he is able to move around a bit more this morning. He feels that the pain is alleviated by pain medication for 61min-1hr and then it returns.   Objective:  Vital signs in last 24 hours: Vitals:   05/02/17 1300 05/02/17 1920 05/03/17 0417 05/03/17 0500  BP: (!) 154/75 (!) 152/85 (!) 140/96   Pulse: 84 87 91   Resp: 16 16 16    Temp: 98.2 F (36.8 C) 98.2 F (36.8 C) 98.5 F (36.9 C)   TempSrc: Oral Oral Oral   SpO2: 96% 97% 100%   Weight:    (!) 363 lb 8.6 oz (164.9 kg)  Height:       Physical Exam  Constitutional: He appears well-developed and well-nourished. No distress.  HENT:  Head: Normocephalic and atraumatic.  Eyes: Conjunctivae are normal.  Cardiovascular: Normal rate, regular rhythm and normal heart sounds.  Respiratory: Effort normal and breath sounds normal. No respiratory distress. He has no wheezes.  GI: Soft. Bowel sounds are normal. He exhibits no distension. There is no tenderness.  Musculoskeletal: He exhibits no edema.  Neurological: He is alert. No cranial nerve deficit.  3/5 right lower extremity strength and 4/5 left lower extremity strength. Sensation intact bilaterally in lower extremity.   Skin: He is not diaphoretic. No erythema.  Psychiatric: He has a normal mood and affect. His behavior is normal. Judgment and thought content normal.   Assessment/Plan:  Mr. Emberton is a 42 y.o male with essential hypertension, diabetes mellitus type 2, history of IV drug use, TIA, opioid use disorder, major depressive disorder, bipolar disorder, chronic pain syndrome, morbid obesity who presents with back pain.Afebrile on admission with mild leukocytosis of 11. MRI thoracic and lumbar spine showed findings consistent for thoracic vertebral osteomyelitis. IR drained plegmenon and sent for culture. Currently being treated with IV vancomycin and ceftriaxone.  Thoracic  SpineOsteomyelitis The patient is still in pain, but he feels that he is able to move around a bit more today. Patient continues to be afebrile and no leukocytosis present (wbc=8.0).   -Cultures from phlegmon have no growth for the past 2 days -Blood cultures no growth -Antibiotics day 3. Continuing IV vancomycin and cefepime has been changed to ceftriaxone.  -Appreciate Infectious Disease following: They recommend treating with IV antibiotics for 8 weeks. Will follow with ID and they will also treat for hep c at that time.  -Placed OPAT consult -Put order in for picc line to be placed  -TTE (05/02/17): LVEF 55-60%, no regional wall motion abnormalities, no vegetations visualized. No clinical reason to do TEE at this time  -Pain control with Ocycodone 10mg  q8hrs, dilaudid 1mg  q2hrs prn. Added ibuprofen 400mg  q4hrs with intent to try to de-escalate patient's pain regimen. The patient has a non-specific allergy to ibuprofen. Checked with patient and he mentions that he does not recall having allergy to ibuprofen.  Substance use disorder -consulted social work to help assist with grief counseling and outpatient substance abuse programs   Chronic Diastolic Heart Failure with preserved EF The patient appears euvolemic today.   -Continuing to hold lasix  Diabetes Mellitus Type 2 Patient'sHbA1c=5.3(05/01/17). Blood glucose over the past 24 hrs has ranged 115-132.  -Will continue to monitor patient's blood glucose and recommend good lifestyle modifications -continue gabapentin 800mg  tid for neuropathy  Essential hypertension The patient's blood pressure this morning was 140-154/75-96  -Continue to monitor as the patient is in acute pain  Major depressive disorder Bipolar disorder  -continue trazodone 150mg  qhs -continue geodon 20mg  bid with meals  Hyperlipidemia -Continue atorvastatin 20 mg nightly    Dispo: Anticipated discharge in approximately 1-2 day(s).   Lorenso Courierhundi,  Sheanna Dail, MD 05/03/2017, 1:31 PM Pager: [phone]

## 2017-05-03 NOTE — Plan of Care (Signed)
  Progressing Education: Knowledge of General Education information will improve 05/03/2017 1915 - Progressing by Pilkington-Burchett, Marlow BaarsJordana G, RN Health Behavior/Discharge Planning: Ability to manage health-related needs will improve 05/03/2017 1915 - Progressing by Pilkington-Burchett, Marlow BaarsJordana G, RN Clinical Measurements: Ability to maintain clinical measurements within normal limits will improve 05/03/2017 1915 - Progressing by Pilkington-Burchett, Marlow BaarsJordana G, RN Will remain free from infection 05/03/2017 1915 - Progressing by Pilkington-Burchett, Marlow BaarsJordana G, RN Diagnostic test results will improve 05/03/2017 1915 - Progressing by Pilkington-Burchett, Marlow BaarsJordana G, RN Respiratory complications will improve 05/03/2017 1915 - Progressing by Pilkington-Burchett, Marlow BaarsJordana G, RN Cardiovascular complication will be avoided 05/03/2017 1915 - Progressing by Pilkington-Burchett, Marlow BaarsJordana G, RN Activity: Risk for activity intolerance will decrease 05/03/2017 1915 - Progressing by Pilkington-Burchett, Marlow BaarsJordana G, RN Nutrition: Adequate nutrition will be maintained 05/03/2017 1915 - Progressing by Pilkington-Burchett, Marlow BaarsJordana G, RN Coping: Level of anxiety will decrease 05/03/2017 1915 - Progressing by Pilkington-Burchett, Marlow BaarsJordana G, RN Elimination: Will not experience complications related to bowel motility 05/03/2017 1915 - Progressing by Pilkington-Burchett, Marlow BaarsJordana G, RN Will not experience complications related to urinary retention 05/03/2017 1915 - Progressing by Pilkington-Burchett, Marlow BaarsJordana G, RN Pain Managment: General experience of comfort will improve 05/03/2017 1915 - Progressing by Pilkington-Burchett, Marlow BaarsJordana G, RN Safety: Ability to remain free from injury will improve 05/03/2017 1915 - Progressing by Pilkington-Burchett, Marlow BaarsJordana G, RN Skin Integrity: Risk for impaired skin integrity will decrease 05/03/2017 1915 - Progressing by Pilkington-Burchett, Marlow BaarsJordana G, RN

## 2017-05-03 NOTE — Progress Notes (Signed)
Pharmacy Antibiotic Note  Bryan Gallegos is a 42 y.o. male admitted on 04/30/2017 with vertebral osteo.  Pharmacy has been consulted for vancomycin and ceftriaxone dosing.  Patient is going to be on antibiotics for 8 weeks, through 4/27 per ID.  Vancomycin trough tonight is in goal range at 9717mcg/mL on 1250mg  IV q8h. Concerned changing patient to a q12h regimen will result in too high of peaks, therefore will continue with q8h regimen. SCr has been slowly trending up over the past 48h.  Plan: Continue vancomycin 1250mg  IV Q8h  Continue ceftriaxone 2g IV q24h Monitor clinical picture, renal function Recheck BMET and VT tomorrow at 1330 to check for accumulation and a decrease in renal function    Height: 6' 1.5" (186.7 cm) Weight: (!) 363 lb 8.6 oz (164.9 kg) IBW/kg (Calculated) : 81.05  Temp (24hrs), Avg:98.4 F (36.9 C), Min:98.2 F (36.8 C), Max:98.5 F (36.9 C)  Recent Labs  Lab 04/30/17 2233 05/02/17 0648 05/03/17 0308 05/03/17 1430  WBC 11.0* 6.2 8.0  --   CREATININE 0.74 0.81 0.99  --   VANCOTROUGH  --   --   --  17    Estimated Creatinine Clearance: 156 mL/min (by C-G formula based on SCr of 0.99 mg/dL).    Allergies  Allergen Reactions  . Ace Inhibitors Anaphylaxis  . Atenolol Anaphylaxis  . Lisinopril Anaphylaxis  . Prednisone Hives  . Ibuprofen Itching, Swelling and Other (See Comments)    Hand/feet swelling   . Tylenol [Acetaminophen] Other (See Comments)    Reaction:  GI bleeding    Vancomycin 3/3 >> Cefepime 3/3 >>3/4 Rocephin 3/4>>  3/3: Needle aspirate: ngtd 3/2: Blood Cx: 1/1 ngtd   Thank you for allowing pharmacy to be a part of this patient's care.  Colbi Schiltz D. Shakeya Kerkman, PharmD, BCPS Clinical Pharmacist [phone] 05/03/2017 4:52 PM

## 2017-05-03 NOTE — Progress Notes (Signed)
Medicine attending: I examined this patient today together with resident physician Dr. Lorenso CourierVahini Chundi and I concur with her evaluation and management plan which we discussed together. He has more strength at the right ankle today 3+-4/5.  Reflexes remain absent symmetric at the knees.  Any movement still causes significant back pain. Blood cultures negative so far. Fluid aspirated from the T11-12 area of inflammation no growth so far. Infectious disease recommending 8 weeks of antibiotic therapeutic to include vancomycin plus ceftriaxone.  Obviously this would not cover Pseudomonas. Minimal elevation of his white count on admission at 11,000 currently normal at 8000. He has been afebrile since admission. We will make arrangements to have a PICC catheter placed and get him in the outpatient IV antibiotic therapy monitoring program. Infectious disease service to follow as an outpatient and also address his hepatitis C infection. Anticipate discharge when these arrangements are in place.

## 2017-05-04 ENCOUNTER — Telehealth: Payer: Self-pay

## 2017-05-04 ENCOUNTER — Inpatient Hospital Stay: Payer: Self-pay

## 2017-05-04 DIAGNOSIS — Z95828 Presence of other vascular implants and grafts: Secondary | ICD-10-CM

## 2017-05-04 DIAGNOSIS — M5134 Other intervertebral disc degeneration, thoracic region: Secondary | ICD-10-CM

## 2017-05-04 DIAGNOSIS — F1410 Cocaine abuse, uncomplicated: Secondary | ICD-10-CM

## 2017-05-04 DIAGNOSIS — M4804 Spinal stenosis, thoracic region: Secondary | ICD-10-CM

## 2017-05-04 DIAGNOSIS — M4694 Unspecified inflammatory spondylopathy, thoracic region: Secondary | ICD-10-CM

## 2017-05-04 DIAGNOSIS — M5127 Other intervertebral disc displacement, lumbosacral region: Secondary | ICD-10-CM

## 2017-05-04 LAB — BASIC METABOLIC PANEL
Anion gap: 8 (ref 5–15)
BUN: 10 mg/dL (ref 6–20)
CALCIUM: 7.5 mg/dL — AB (ref 8.9–10.3)
CO2: 26 mmol/L (ref 22–32)
CREATININE: 0.73 mg/dL (ref 0.61–1.24)
Chloride: 106 mmol/L (ref 101–111)
Glucose, Bld: 108 mg/dL — ABNORMAL HIGH (ref 65–99)
Potassium: 3.5 mmol/L (ref 3.5–5.1)
SODIUM: 140 mmol/L (ref 135–145)

## 2017-05-04 LAB — GLUCOSE, CAPILLARY
Glucose-Capillary: 127 mg/dL — ABNORMAL HIGH (ref 65–99)
Glucose-Capillary: 157 mg/dL — ABNORMAL HIGH (ref 65–99)

## 2017-05-04 LAB — VANCOMYCIN, TROUGH: VANCOMYCIN TR: 14 ug/mL — AB (ref 15–20)

## 2017-05-04 MED ORDER — VANCOMYCIN HCL 10 G IV SOLR
1500.0000 mg | Freq: Two times a day (BID) | INTRAVENOUS | Status: DC
  Administered 2017-05-04: 1500 mg via INTRAVENOUS
  Filled 2017-05-04 (×2): qty 1500

## 2017-05-04 MED ORDER — VANCOMYCIN IV (FOR PTA / DISCHARGE USE ONLY): 1500.0000 mg | Freq: Two times a day (BID) | INTRAVENOUS | 0 refills | Status: AC

## 2017-05-04 MED ORDER — OXYCODONE HCL 10 MG PO TABS: 10.0000 mg | ORAL_TABLET | Freq: Three times a day (TID) | ORAL | 0 refills | Status: AC

## 2017-05-04 MED ORDER — VANCOMYCIN HCL 10 G IV SOLR
1500.0000 mg | Freq: Two times a day (BID) | INTRAVENOUS | Status: DC
  Filled 2017-05-04: qty 1500

## 2017-05-04 MED ORDER — CEFTRIAXONE IV (FOR PTA / DISCHARGE USE ONLY): 2.0000 g | INTRAVENOUS | 0 refills | Status: AC

## 2017-05-04 MED ORDER — SODIUM CHLORIDE 0.9% FLUSH: 10.0000 mL | INTRAVENOUS | Status: DC | PRN

## 2017-05-04 NOTE — Care Management Note (Signed)
Case Management Note  Patient Details  Name: Bryan Gallegos L Kingbird MRN: [id number] Date of Birth: 08/04/1975  Subjective/Objective:     42 yr old gentleman admitted with Osteomyelitis of T11-T12.                Action/Plan: Patient will go home on IV antibiotics for 8 weeks. Case manager had discussion with patient regarding history of IV drug abuse. Patient sincerely expressed his reasons for past use and states that he has too much to live for, he is a widow and single parent, raising his children is his priority. Referral for Home Health RN given to Shon Milletan Phillips, [facility] Liaison, also spoke with Jeri ModenaPam Chandler, [facility] IV antibiotic specialist. Patient will have support of family at discharge, his sister is a Engineer, civil (consulting)nurse. PICC line to be placed today.    Expected Discharge Date:  05/04/17               Expected Discharge Plan:  Home w Home Health Services  In-House Referral:     Discharge planning Services  CM Consult  Post Acute Care Choice:  Home Health, Durable Medical Equipment Choice offered to:  Patient  DME Arranged:  IV pump/equipment DME Agency:  [facility]  [facility]  Status of Service:  Completed, signed off  If discussed at Long Length of Stay Meetings, dates discussed:    Additional Comments:  Durenda GuthrieBrady, Morganna Styles Naomi, RN 05/04/2017, 11:07 AM

## 2017-05-04 NOTE — Progress Notes (Signed)
Pharmacy Antibiotic Note  Bryan Gallegos is a 42 y.o. male admitted on 04/30/2017 with vertebral osteo.  Pharmacy has been consulted for vancomycin and ceftriaxone dosing.  Patient is going to be on antibiotics for 8 weeks, through 4/27 per ID.  Vancomycin trough last night was in goal range at 5117mcg/mL on 1250mg  IV q8h and repeat today was 14 (stablish, SCr trended back down today at 0.73). Will chagne patient to a q12h regimen to prevent accumulation going forward.    Plan: Change vancomycin 1500mg  IV Q12h  Continue ceftriaxone 2g IV q24h Monitor clinical picture, renal function See OPAT note and pended orders in discharge   Height: 6' 1.5" (186.7 cm) Weight: (!) 360 lb 3.7 oz (163.4 kg) IBW/kg (Calculated) : 81.05  Temp (24hrs), Avg:98.4 F (36.9 C), Min:98.1 F (36.7 C), Max:98.6 F (37 C)  Recent Labs  Lab 04/30/17 2233 05/02/17 0648 05/03/17 0308 05/03/17 1430 05/04/17 0455 05/04/17 1401  WBC 11.0* 6.2 8.0  --   --   --   CREATININE 0.74 0.81 0.99  --  0.73  --   VANCOTROUGH  --   --   --  17  --  14*    Estimated Creatinine Clearance: 192 mL/min (by C-G formula based on SCr of 0.73 mg/dL).    Allergies  Allergen Reactions  . Ace Inhibitors Anaphylaxis  . Atenolol Anaphylaxis  . Lisinopril Anaphylaxis  . Prednisone Hives  . Ibuprofen Itching, Swelling and Other (See Comments)    Hand/feet swelling   . Tylenol [Acetaminophen] Other (See Comments)    Reaction:  GI bleeding    Vancomycin 3/3 >> Cefepime 3/3 >>3/4 Rocephin 3/4>>  3/3: Needle aspirate: ngtd 3/2: Blood Cx: 1/1 ngtd   Thank you for allowing pharmacy to be a part of this patient's care.  Link SnufferJessica Imagine Nest, PharmD, BCPS, BCCCP Clinical Pharmacist Clinical phone 05/04/2017 until 3;30PM [phone]- [phone] After hours, please call #[phone] 05/04/2017 3:28 PM

## 2017-05-04 NOTE — Progress Notes (Signed)
   Subjective: [name] was seen resting in his bed this morning. He stated that he was able to walk around the hospital earlier this morning with his pain a bit alleviated.   Objective:  Vital signs in last 24 hours: Vitals:   05/03/17 1500 05/03/17 2014 05/04/17 0345 05/04/17 0500  BP: (!) 151/77 (!) 116/56 132/68   Pulse: 75 75 71   Resp: 18 18 18    Temp: 98.4 F (36.9 C) 98.6 F (37 C) 98.1 F (36.7 C)   TempSrc: Oral Oral Oral   SpO2: 94% 96% 94%   Weight:    (!) 360 lb 3.7 oz (163.4 kg)  Height:       Physical Exam  Constitutional: He appears well-developed and well-nourished. No distress.  HENT:  Head: Normocephalic and atraumatic.  Eyes: Conjunctivae are normal.  Cardiovascular: Normal rate, regular rhythm and normal heart sounds.  Respiratory: Effort normal and breath sounds normal. No respiratory distress. He has no wheezes.  GI: Soft. Bowel sounds are normal. He exhibits no distension. There is tenderness (generalized ).  Musculoskeletal: He exhibits no edema.  Neurological: He is alert.  Skin: He is not diaphoretic.  Psychiatric: He has a normal mood and affect. His behavior is normal. Judgment and thought content normal.   Assessment/Plan:  [name] is a 42 y.o male with essential hypertension, diabetes mellitus type 2, history of IV drug use, TIA, opioid use disorder, major depressive disorder, bipolar disorder, chronic pain syndrome, morbid obesity who presents with back pain.Afebrile on admission with mild leukocytosis of 11.MRI thoracic and lumbar spine showed findings consistent for thoracic vertebral osteomyelitis. IR drained plegmenon and sent for culture. Currently being treated with IV vancomycin and ceftriaxone.  Thoracic SpineOsteomyelitis The patient appears well today and was able to ambulate around earlier in the morning without pain which is a large improvement from day of admission when the patient noted that he could not even move or  stand.   -Cultures from phlegmon have no growth for the past 3 days -Blood cultures no growth for 3 days -Antibiotics day 4. Continuing IV vancomycin and ceftriaxone -Patient will be discharged today per ID recs with IV abx for 8 weeks with OPAT following  -PICC line has been placed -Pain control with Ocycodone 10mg  q8hrs. Discharged with 5 day course of oxy and recommend follow up at pain clinic  Substance use disorder -consulted social work to help assist with grief counseling and outpatient substance abuse programs   Chronic Diastolic Heart Failurewith preserved EF  -Resumed home lasix 40mg  qd  Diabetes Mellitus Type 2 Patient'sHbA1c=5.3(05/01/17).Blood glucose over the past 24 hrs has ranged 108-157 -Will continue to monitor patient's blood glucose and recommend good lifestyle modifications -continue gabapentin 800mg  tidfor neuropathy  Essential hypertension The patient's blood pressurethis morning was 116-151/56-77  -Need to discuss need for antihypertensive agent at outpatient follow up when patient is not in so much pain  Major depressive disorder Bipolar disorder  -continue trazodone 150mg  qhs -continue geodon 20mg  bid with meals  Hyperlipidemia -Continue atorvastatin 20 mg nightly  Dispo: Anticipated discharge today  Bryan Gallegos, Bryan Garduno, MD 05/04/2017, 2:33 PM Pager: [phone]

## 2017-05-04 NOTE — Discharge Summary (Signed)
Name: Bryan Gallegos MRN: [id number] DOB: 1975/03/06 42 y.o. PCP: Patient, No Pcp Per  Date of Admission: 04/30/2017  9:36 PM Date of Discharge: 05/04/2017 Attending Physician: Dr. Beryle Beams  Discharge Diagnosis:  Principal Problem:   Vertebral osteomyelitis ([place])  Active Problems:   Reactive depression   IVDU (intravenous drug user)   Acute midline low back pain   Bipolar affective disorder [facility])   Discharge Medications: Allergies as of 05/04/2017      Reactions   Ace Inhibitors Anaphylaxis   Atenolol Anaphylaxis   Lisinopril Anaphylaxis   Prednisone Hives   Ibuprofen Itching, Swelling, Other (See Comments)   Hand/feet swelling    Tylenol [acetaminophen] Other (See Comments)   Reaction:  GI bleeding       Medication List    STOP taking these medications   DULoxetine 30 MG capsule Commonly known as:  CYMBALTA   QUEtiapine 100 MG tablet Commonly known as:  SEROQUEL   QUEtiapine 400 MG tablet Commonly known as:  SEROQUEL     TAKE these medications   albuterol 108 (90 Base) MCG/ACT inhaler Commonly known as:  PROVENTIL HFA;VENTOLIN HFA Inhale 1-2 puffs into the lungs every 6 (six) hours as needed for wheezing or shortness of breath.   atorvastatin 20 MG tablet Commonly known as:  LIPITOR Take 1 tablet (20 mg total) by mouth at bedtime. For high cholesterol   cefTRIAXone IVPB Commonly known as:  ROCEPHIN Inject 2 g into the vein daily. Indication:  Vertebral osteomyelitis Last Day of Therapy:  06/25/17 Labs - Once weekly:  CBC/D and BMP, Labs - Every other week:  ESR and CRP   COMBIVENT RESPIMAT 20-100 MCG/ACT Aers respimat Generic drug:  Ipratropium-Albuterol Inhale 1 puff into the lungs every 6 (six) hours as needed for wheezing or shortness of breath.   furosemide 40 MG tablet Commonly known as:  LASIX Take 1 tablet (40 mg total) by mouth daily. For swellings   gabapentin 400 MG capsule Commonly known as:  NEURONTIN Take 2 capsules (800 mg  total) by mouth 3 (three) times daily. For agitation/Neuropathy   hydrOXYzine 25 MG tablet Commonly known as:  ATARAX/VISTARIL Take 1 tablet (25 mg total) by mouth every 6 (six) hours as needed for anxiety.   omeprazole 40 MG capsule Commonly known as:  PRILOSEC Take 1 capsule (40 mg total) by mouth 2 (two) times daily. For acid reflux   Oxycodone HCl 10 MG Tabs Take 1 tablet (10 mg total) by mouth every 8 (eight) hours for 5 days.   polyethylene glycol powder powder Commonly known as:  MIRALAX Take 17 g by mouth daily. For constipation   tiZANidine 4 MG capsule Commonly known as:  ZANAFLEX Take 4 mg by mouth 3 (three) times daily as needed for muscle spasms.   traZODone 150 MG tablet Commonly known as:  DESYREL Take 1 tablet (150 mg total) by mouth at bedtime as needed for sleep.   ursodiol 300 MG capsule Commonly known as:  ACTIGALL Take 1 capsule (300 mg total) by mouth 2 (two) times daily. For kidney stone   vancomycin IVPB Inject 1,500 mg into the vein every 12 (twelve) hours. Indication:  Vertebral Osteomyelitis Last Day of Therapy:  06/25/17 Labs - Sunday/Monday:  CBC/D, BMP, and vancomycin trough. Labs - Thursday:  BMP and vancomycin trough Labs - Every other week:  ESR and CRP   ziprasidone 20 MG capsule Commonly known as:  GEODON Take 20 mg by mouth 2 (two) times daily with  a meal.            Home Infusion Instuctions  (From admission, onward)        Start     Ordered   05/04/17 0000  Home infusion instructions [facility] May follow [place] Dosing Protocol; May administer Cathflo as needed to maintain patency of vascular access device.; Flushing of vascular access device: per [facility] Protocol: 0.9% NaCl pre/post medica...    Question Answer Comment  Instructions May follow [place] Dosing Protocol   Instructions May administer Cathflo as needed to maintain patency of vascular access device.   Instructions Flushing of vascular access device:  per [facility] Protocol: 0.9% NaCl pre/post medication administration and prn patency; Heparin 100 u/ml, 35m for implanted ports and Heparin 10u/ml, 572mfor all other central venous catheters.   Instructions May follow [facility] Anaphylaxis Protocol for First Dose Administration in the home: 0.9% NaCl at 25-50 ml/hr to maintain IV access for protocol meds. Epinephrine 0.3 ml IV/IM PRN and Benadryl 25-50 IV/IM PRN s/s of anaphylaxis.   Instructions [facility] Infusion Coordinator (RN) to assist per patient IV care needs in the home PRN.      05/04/17 1612      Disposition and follow-up:   [facility]  At the hospital follow up visit please address:  1.  Vertebral osteomyelitis: please make sure the patient takes iv vancomycin and ceftriaxone for 8 weeks. Please make sure the patient follows up with infectious disease physicians and internal medicine.   Substance abuse disorder: please consider setting the patient up with outpatient substance abuse program. Please also make sure the patient's mood is being well controlled on geodon and trazodone.   2.  Labs / imaging needed at time of follow-up: none  3.  Pending labs/ test needing follow-up: none  Follow-up Appointments: Follow-up Information    Health, [facility]-Home Follow up.   Specialty:  HoMasonhy:  A representative from Contact information: [address] [place] [phone]        [facility]              Follow up in 4 week(s).   Contact information: [address] [place] [phone]     [facility]. Go on 05/12/2017.   Why:  10:15am Contact information: [address] by problem list: Principal Problem:   Vertebral osteomyelitis  ([place] Problems:   Reactive depression   IVDU (intravenous drug user)   Acute midline low back pain   Bipolar affective disorder ([place]  Vertebral Osteomyelitis The patient presented with acute worsening of his chronic back pain. He reported that he used iv cocaine 3 weeks ago on the anniversary of his wife's death. MRI of thoracic and lumbar spine was done which showed mild degenerative thoracic spinal stenosis, herniation at L5-S1, and confluent inflammation in the T11-T12 vertebral bodies. Ct guided needle aspiration was done at the site of inflammation. There was no growth noted on the aspiration. The patient was initially started on vancomycin and cefepime. Infectious disease was consulted and they recommended an 8 week course of vancomycin and ceftriaxone via picc. PICC line was placed and the patient was [facility]. Patient will follow with ID physicians in 4 weeks and at the [facility] internal medicine center in  1 week.   Substance use disorder The patient stated that he used iv cocaine 3 weeks ago on the anniversary of his wife's death. The patient has concomitant depression which maybe making him turn to substances. The patient was continued on trazodone 141m qhs and geodon 271mbid with meals. The patient may benefit from grief counseling and with outpatient substance abuse programs.   Discharge Vitals:   BP 137/75 (BP Location: Left Arm)   Pulse 80   Temp 98.4 F (36.9 C) (Oral)   Resp 20   Ht 6' 1.5" (1.867 m)   Wt (!) 360 lb 3.7 oz (163.4 kg)   SpO2 95%   BMI 46.88 kg/m   Pertinent Labs, Studies, and Procedures:   CBC Latest Ref Rng & Units 05/03/2017 05/02/2017 04/30/2017  WBC 4.0 - 10.5 K/uL 8.0 6.2 11.0(H)  Hemoglobin 13.0 - 17.0 g/dL 13.7 14.3 14.2  Hematocrit 39.0 - 52.0 % 41.6 44.1 42.7  Platelets 150 - 400 K/uL 204 212 215   CMP Latest Ref Rng & Units 05/04/2017 05/03/2017 05/02/2017  Glucose 65 - 99 mg/dL 108(H) 120(H) 116(H)   BUN 6 - 20 mg/dL _0 Creatinine 0.61 - 1.24 mg/dL 0.73 0.99 0.81  Sodium 135 - 145 mmol/L 140 137 136  Potassium 3.5 - 5.1 mmol/L 3.5 4.1 5.7(H)  Chloride 101 - 111 mmol/L 106 100(L) 105  CO2 22 - 32 mmol/L _1 Calcium 8.9 - 10.3 mg/dL 7.5(L) 9.0 8.8(L)  Total Protein 6.5 - 8.1 g/dL - - -  Total Bilirubin 0.3 - 1.2 mg/dL - - -  Alkaline Phos 38 - 126 U/L - - -  AST 15 - 41 U/L - - -  ALT 17 - 63 U/L - - -   BMP Latest Ref Rng & Units 05/04/2017 05/03/2017 05/02/2017  Glucose 65 - 99 mg/dL 108(H) 120(H) 116(H)  BUN 6 - 20 mg/dL _2 Creatinine 0.61 - 1.24 mg/dL 0.73 0.99 0.81  Sodium 135 - 145 mmol/L 140 137 136  Potassium 3.5 - 5.1 mmol/L 3.5 4.1 5.7(H)  Chloride 101 - 111 mmol/L 106 100(L) 105  CO2 22 - 32 mmol/L _3 Calcium 8.9 - 10.3 mg/dL 7.5(L) 9.0 8.8(L)   Blood Culture    Component Value Date/Time   SDES NEEDLE ASPIRATE PARA CSF T11 T12 05/01/2017 1239   SPECREQUEST Normal 05/01/2017 1239   CULT  05/01/2017 1239    No growth aerobically or anaerobically. Performed at [id number] [place] [id number]  REPTSTATUS 05/06/2017 FINAL 05/01/2017 1239    CK=71  CXR: personally reviewed my interpretation is no pain, no pulmonary vascular congestion, signs of effusion seen  MRI thoracic and lumbar spine (05/01/2017): Mild degenerative thoracic spinal stenosis T11-T12.  Moderate sized herniation at L5-S1.  Confluent inflammation in the T11-T12 vertebral bodies and laterals paraspinal soft tissue that is concerning for acute thoracic osteomyelitis.   Discharge Instructions: Discharge Instructions    Call MD for:  persistant nausea and vomiting   Complete by:  As directed    Call MD for:  redness, tenderness, or signs of infection (pain, swelling, redness, odor or green/yellow discharge around incision site)   Complete by:  As directed    Call MD for:  severe uncontrolled pain   Complete by:  As directed  Call MD for:   temperature >100.4   Complete by:  As directed    Diet - low sodium heart healthy   Complete by:  As directed    Home infusion instructions [facility] May follow [place] Dosing Protocol; May administer Cathflo as needed to maintain patency of vascular access device.; Flushing of vascular access device: per [facility] Protocol: 0.9% NaCl pre/post medica...   Complete by:  As directed    Instructions:  May follow [place] Dosing Protocol   Instructions:  May administer Cathflo as needed to maintain patency of vascular access device.   Instructions:  Flushing of vascular access device: per [facility] Protocol: 0.9% NaCl pre/post medication administration and prn patency; Heparin 100 u/ml, 59m for implanted ports and Heparin 10u/ml, 520mfor all other central venous catheters.   Instructions:  May follow [facility] Anaphylaxis Protocol for First Dose Administration in the home: 0.9% NaCl at 25-50 ml/hr to maintain IV access for protocol meds. Epinephrine 0.3 ml IV/IM PRN and Benadryl 25-50 IV/IM PRN s/s of anaphylaxis.   Instructions:  [place] Coord[place] (RN) to assist per patient IV care needs in the home PRN.   Increase activity slowly   Complete by:  As directed       Signed: ChLars MageMD 05/06/2017, 4:35 PM   Pager: [phone]

## 2017-05-04 NOTE — Telephone Encounter (Signed)
Hospital TOC per dr Delma Officerchundi, discharge 05/04/2017.

## 2017-05-04 NOTE — Progress Notes (Signed)
PHARMACY CONSULT NOTE FOR:  OUTPATIENT  PARENTERAL ANTIBIOTIC THERAPY (OPAT)  Indication: Vertebral Osteomyelitis Regimen: Vancomycin 1500mg  IV every 12 hours and Ceftriaxone 2g IV every 24 hours End date: 06/25/17  IV antibiotic discharge orders are pended. To discharging provider:  please sign these orders via discharge navigator,  Select New Orders & click on the button choice - Manage This Unsigned Work.     Thank you for allowing pharmacy to be a part of this patient's care.  Link SnufferJessica Anshu Wehner, PharmD, BCPS, BCCCP Clinical Pharmacist Clinical phone 05/04/2017 until 3:[phone] After hours, please call [phone] 05/04/2017, 11:22 AM

## 2017-05-04 NOTE — Progress Notes (Signed)
Medicine attending discharge note: I personally examined this patient on the day of discharge and I attest to the accuracy of the discharge evaluation and plan as recorded in the final progress note by resident physician Dr. Lorenso CourierVahini Chundi and separate discharge summary to follow.  42 year old man with intermittent substance abuse, recently parenteral cocaine.  He has chronic back pain.  However, he presented on the day of admission with acute, incapacitating back pain different in intensity and quality from his chronic pain.  Evaluation revealed an area of inflammation at T11-T12 on MRI scan felt to be consistent with vertebral osteomyelitis.  CT-guided needle aspiration of the area of concern was done and a small amount of fluid was obtained.  This showed a rare white blood cell, predominantly mononuclear, no organisms.  No growth at 72 hours.  He was seen in consultation by infectious disease.  An 8-week course of vancomycin plus ceftriaxone recommended and initiated.  A PICC catheter was placed prior to discharge for prolonged outpatient antibiotic therapy. Of note, although he reported fevers at home, highest fever recorded in the hospital was 100.4 on admission.  He was otherwise afebrile for the duration of the hospital course. White count 11,000 with 61% neutrophils on admission.  8000 on March 5 1 day prior to discharge. He had a right foot drop on exam.  This improved on antibiotic therapy.  4/5 in flexion and extension on day of discharge.  Disposition: Condition stable at time of discharge He will follow-up with the infectious disease center outpatient antibiotic therapy program. There were no complications

## 2017-05-04 NOTE — Plan of Care (Signed)
  Progressing Education: Knowledge of General Education information will improve 05/04/2017 1028 - Progressing by Quentin CornwallMadison, Tobi Groesbeck, RN Health Behavior/Discharge Planning: Ability to manage health-related needs will improve 05/04/2017 1028 - Progressing by Quentin CornwallMadison, Courteny Egler, RN Clinical Measurements: Ability to maintain clinical measurements within normal limits will improve 05/04/2017 1028 - Progressing by Quentin CornwallMadison, Ivalee Strauser, RN Will remain free from infection 05/04/2017 1028 - Progressing by Quentin CornwallMadison, Kamar Callender, RN Diagnostic test results will improve 05/04/2017 1028 - Progressing by Quentin CornwallMadison, Keshara Kiger, RN Respiratory complications will improve 05/04/2017 1028 - Progressing by Quentin CornwallMadison, Rashena Dowling, RN Cardiovascular complication will be avoided 05/04/2017 1028 - Progressing by Quentin CornwallMadison, Lindi Abram, RN Activity: Risk for activity intolerance will decrease 05/04/2017 1028 - Progressing by Quentin CornwallMadison, Keenan Dimitrov, RN Nutrition: Adequate nutrition will be maintained 05/04/2017 1028 - Progressing by Quentin CornwallMadison, Mellisa Arshad, RN Coping: Level of anxiety will decrease 05/04/2017 1028 - Progressing by Quentin CornwallMadison, Latron Ribas, RN Elimination: Will not experience complications related to bowel motility 05/04/2017 1028 - Progressing by Quentin CornwallMadison, Blessen Kimbrough, RN Will not experience complications related to urinary retention 05/04/2017 1028 - Progressing by Quentin CornwallMadison, Columbia Pandey, RN Pain Managment: General experience of comfort will improve 05/04/2017 1028 - Progressing by Quentin CornwallMadison, Danine Hor, RN Safety: Ability to remain free from injury will improve 05/04/2017 1028 - Progressing by Quentin CornwallMadison, Jamar Weatherall, RN Skin Integrity: Risk for impaired skin integrity will decrease 05/04/2017 1028 - Progressing by Quentin CornwallMadison, Alann Avey, RN

## 2017-05-04 NOTE — Clinical Social Work Note (Signed)
Clinical Social Work Assessment  Patient Details  Name: Bryan Gallegos MRN: [id number] Date of Birth: 1975/04/27  Date of referral:  05/04/17               Reason for consult:  Facility Placement                Permission sought to share information with:  Chartered certified accountant granted to share information::  No  Name::        Agency::     Relationship::     Contact Information:     Housing/Transportation Living arrangements for the past 2 months:  Single Family Home Source of Information:  Patient Patient Interpreter Needed:  None Criminal Activity/Legal Involvement Pertinent to Current Situation/Hospitalization:  No - Comment as needed Significant Relationships:  Dependent Children, Other Family Members Lives with:  Self, Minor Children Do you feel safe going back to the place where you live?  Yes Need for family participation in patient care:  No (Coment)  Care giving concerns:  At progression meeting it was indicated that patient has hx of iv drug use and will need 8 weeks of rocephin and vancomycin.  Given his history of iv drug use he may need SNF for placement as he will have to have a piic line. CSW met with patient at bedside this morning. Pt shared that he used jan 2019, after being clean for some time. He shared that his wife passed away about 8 months ago and he is still dealing with that loss. CSW validated his loss. CSW explained her role for meeting and options for SNF placement as patient may not be able to return home with picc line. Pt indicated that medical doctor already advised him that he would be able to get the picc line and would discharge home today. Pt shared that he was in treatment for his substance use and has been pretty clean since the age of 4 years. Pt indicated if he needed to use drugs he would and he indicated that he now have to care for his three children and plans on being here for his family. CSW discussed his coping skills  for remaining clean and sober for so long. CSW encouraged patient's past success and support his continued efforts. Pt declined SNF at this time as he is going home with iv medication as he reported.   Social Worker assessment / plan:  CSW will f/u with RNCM. CSW will sign off for now as no CSW intervention needed.   Employment status:  Disabled (Comment on whether or not currently receiving Disability) Insurance information:  Medicare PT Recommendations:  Not assessed at this time Information / Referral to community resources:  Support Groups  Patient/Family's Response to care:  Patient thanked CSW for meeting to discuss disposition, however, patient will return home.  Patient/Family's Understanding of and Emotional Response to Diagnosis, Current Treatment, and Prognosis:  Patient has good understanding of care needed. Pt agreeable to return home with medicine. RNCM will assist with home needs. CSW will provide resources for grief and substances.  Emotional Assessment Appearance:  Appears stated age Attitude/Demeanor/Rapport:  (Cooperative) Affect (typically observed):  Accepting, Appropriate Orientation:  Oriented to Self, Oriented to Place, Oriented to  Time, Oriented to Situation Alcohol / Substance use:  Not Applicable Psych involvement (Current and /or in the community):  No (Comment)  Discharge Needs  Concerns to be addressed:  Coping/Stress Concerns, Grief and Loss Concerns Readmission within the last 30 days:  Yes Current discharge risk:  Other(Hx of substance use) Barriers to Discharge:  No Barriers Identified   Normajean Baxter, LCSW 05/04/2017, 11:14 AM

## 2017-05-04 NOTE — Progress Notes (Signed)
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 Cultures with no growth to date indicating culture negative osteomyelitis. Awaiting PICC. He has remained afebrile.  1. Continue vancomycin and ceftriaxone with plan to treat for 8 weeks with end date 06/25/17. 2. Remove PICC at completion of treatment. 3. Follow up in ID clinic in 4 weeks. 4. Ok to consider discharge from ID perspective - we will sign off and will be available as needed during hospitalization.   Chronic Hepatitis C - Plan for treatment outpatient. Currently asymptomatic.   Diagnosis: Vertebral osteomyelitis  Culture Result: Culture negative  Allergies  Allergen Reactions  . Ace Inhibitors Anaphylaxis  . Atenolol Anaphylaxis  . Lisinopril Anaphylaxis  . Prednisone Hives  . Ibuprofen Itching, Swelling and Other (See Comments)    Hand/feet swelling   . Tylenol [Acetaminophen] Other (See Comments)    Reaction:  GI bleeding     OPAT Orders Discharge antibiotics: Vancomycin and ceftriaxone Per pharmacy protocol  Aim for Vancomycin trough 15-20 (unless otherwise indicated) Duration: 8 weeks  End Date: 06/25/17  [facility] Care Per Protocol:  Labs weekly while on IV antibiotics: _X_ CBC with differential __ BMP __ CMP _X_ CRP _X_ ESR _X_ Vancomycin trough  _X_ Please pull PIC at completion of IV antibiotics __ Please leave PIC in place until doctor has seen patient or been notified  Fax weekly labs to [phone]  Clinic Follow Up Appt: ~ 4 weeks with Terri Piedra, NP   Principal Problem:   Vertebral osteomyelitis ([place]) Active Problems:   Reactive depression   IVDU (intravenous drug user)   Acute midline low back pain   Bipolar affective disorder ([place])   . furosemide  40 mg Oral Daily  . gabapentin  800 mg Oral TID  . insulin aspart  0-9 Units Subcutaneous  TID WC  . oxyCODONE  10 mg Oral Q8H  . pantoprazole  40 mg Oral Daily  . sodium chloride flush  10-40 mL Intracatheter Q12H  . ziprasidone  20 mg Oral BID WC    SUBJECTIVE:  Afebrile overnight with no leukocytosis. Aspiration cultures remain with no growth to date. Continues to receive vancomycin and ceftriaxone. Back pain is increased today. Denies fevers or chills.   Allergies  Allergen Reactions  . Ace Inhibitors Anaphylaxis  . Atenolol Anaphylaxis  . Lisinopril Anaphylaxis  . Prednisone Hives  . Ibuprofen Itching, Swelling and Other (See Comments)    Hand/feet swelling   . Tylenol [Acetaminophen] Other (See Comments)    Reaction:  GI bleeding      Review of Systems: Review of Systems  Constitutional: Negative for chills and fever.  Respiratory: Negative for cough, shortness of breath and wheezing.   Cardiovascular: Negative for chest pain.  Musculoskeletal: Positive for back pain.  Skin: Negative for rash.  Neurological: Positive for focal weakness. Negative for dizziness, tingling and tremors.      OBJECTIVE: Vitals:   05/03/17 1500 05/03/17 2014 05/04/17 0345 05/04/17 0500  BP: (!) 151/77 (!) 116/56 132/68   Pulse: 75 75 71   Resp: '18 18 18   '$ Temp: 98.4 F (36.9 C) 98.6 F (37 C) 98.1 F (36.7 C)   TempSrc: Oral Oral Oral   SpO2: 94% 96% 94%   Weight:    (!) 360 lb 3.7 oz (163.4 kg)  Height:  Body mass index is 46.88 kg/m.  Physical Exam  Constitutional: He is oriented to person, place, and time and well-developed, well-nourished, and in no distress. No distress.  Pulmonary/Chest: Breath sounds normal. No respiratory distress. He has no wheezes. He has no rales. He exhibits no tenderness.  Abdominal: Soft. Bowel sounds are normal. He exhibits no distension. There is no tenderness.  Musculoskeletal:  Low back - No obvious deformity, discoloration or edema. Tenderness around T10-L2 with pain radiating laterally to both sides. Distal pulses and  sensation are intact and appropriate. Mild weakness of right lower extremity compared to left.   Neurological: He is alert and oriented to person, place, and time.  Skin: Skin is warm and dry. No rash noted.  Psychiatric: Mood, affect and judgment normal.    Lab Results Lab Results  Component Value Date   WBC 8.0 05/03/2017   HGB 13.7 05/03/2017   HCT 41.6 05/03/2017   MCV 92.0 05/03/2017   PLT 204 05/03/2017    Lab Results  Component Value Date   CREATININE 0.73 05/04/2017   BUN 10 05/04/2017   NA 140 05/04/2017   K 3.5 05/04/2017   CL 106 05/04/2017   CO2 26 05/04/2017    Lab Results  Component Value Date   ALT 77 (H) 04/30/2017   AST 58 (H) 04/30/2017   ALKPHOS 82 04/30/2017   BILITOT 0.8 04/30/2017     Microbiology: Recent Results (from the past 240 hour(s))  Culture, blood (Routine X 2) w Reflex to ID Panel     Status: None (Preliminary result)   Collection Time: 04/30/17 10:33 PM  Result Value Ref Range Status   Specimen Description   Final    BLOOD LEFT ARM Performed at [facility], [place] [address] [place], [place] [address]    Special Requests   Final    IN PEDIATRIC BOTTLE Blood Culture adequate volume Performed at [facility] [address] [place], [place] [address]    Culture   Final    NO GROWTH 2 DAYS Performed at [facility] [address], [place], [place] [address]    Report Status PENDING  Incomplete  Aerobic/Anaerobic Culture (surgical/deep wound)     Status: None (Preliminary result)   Collection Time: 05/01/17 12:39 PM  Result Value Ref Range Status   Specimen Description NEEDLE ASPIRATE PARA CSF T11 T12  Final   Special Requests Normal  Final   Gram Stain   Final    RARE WBC PRESENT, PREDOMINANTLY MONONUCLEAR NO ORGANISMS SEEN    Culture   Final    NO GROWTH 2 DAYS NO ANAEROBES ISOLATED; CULTURE IN PROGRESS FOR 5 DAYS Performed at [facility], [place] [address], [place],  [place] [address]    Report Status PENDING  Incomplete  MRSA PCR Screening     Status: None   Collection Time: 05/01/17  2:20 PM  Result Value Ref Range Status   MRSA by PCR NEGATIVE NEGATIVE Final    Comment:        The GeneXpert MRSA Assay (FDA approved for NASAL specimens only), is one component of a comprehensive MRSA colonization surveillance program. It is not intended to diagnose MRSA infection nor to guide or monitor treatment for MRSA infections. Performed at [facility], [place] [address] [place], [place] [address]      Terri Piedra, [place] [facility] [phone] Pager  05/04/2017  9:10 AM

## 2017-05-04 NOTE — Progress Notes (Signed)
Peripherally Inserted Central Catheter/Midline Placement  The IV Nurse has discussed with the patient and/or persons authorized to consent for the patient, the purpose of this procedure and the potential benefits and risks involved with this procedure.  The benefits include less needle sticks, lab draws from the catheter, and the patient may be discharged home with the catheter. Risks include, but not limited to, infection, bleeding, blood clot (thrombus formation), and puncture of an artery; nerve damage and irregular heartbeat and possibility to perform a PICC exchange if needed/ordered by physician.  Alternatives to this procedure were also discussed.  Bard Power PICC patient education guide, fact sheet on infection prevention and patient information card has been provided to patient /or left at bedside.    PICC/Midline Placement Documentation        Bryan Gallegos, Bryan Gallegos 05/04/2017, 12:27 PM

## 2017-05-04 NOTE — Progress Notes (Signed)
[facility]  [facility] and [facility] pharmacy services for Bryan Gallegos at home.  Bryan Gallegos Hospital Infusion Coordinator will provide in hospital teaching prior to DC today.  If patient discharges after hours, please call [phone].   [name] 05/04/2017, 2:51 PM

## 2017-05-04 NOTE — Progress Notes (Signed)
Pt discharged home with son. HH nurse educated patient at beside on PICC and infusions and set up HH infusions. AVS and scripts given to and explained to patient, he verbalized understanding. All belongings sent with patient. VSS. BP 137/75 (BP Location: Left Arm)   Pulse 80   Temp 98.4 F (36.9 C) (Oral)   Resp 20   Ht 6' 1.5" (1.867 m)   Wt (!) 163.4 kg (360 lb 3.7 oz)   SpO2 95%   BMI 46.88 kg/m

## 2017-05-04 NOTE — Discharge Instructions (Signed)
It was a pleasure taking care of you [name]. During your hospitalization you were managed for a bone infection. Please do the following  -Follow with infectious disease in 4 weeks -Follow up at Internal Medicine clinic

## 2017-05-05 NOTE — Telephone Encounter (Signed)
Pt remains inpt 

## 2017-05-06 LAB — AEROBIC/ANAEROBIC CULTURE (SURGICAL/DEEP WOUND): CULTURE: NO GROWTH

## 2017-05-06 LAB — AEROBIC/ANAEROBIC CULTURE W GRAM STAIN (SURGICAL/DEEP WOUND): Special Requests: NORMAL

## 2017-05-06 LAB — CULTURE, BLOOD (ROUTINE X 2)
Culture: NO GROWTH
Special Requests: ADEQUATE

## 2017-05-09 ENCOUNTER — Telehealth: Payer: Self-pay

## 2017-05-09 NOTE — Telephone Encounter (Signed)
Nurse was at patient's home today for PICC dressing change and his line was exposed by 3 cm. On initial start date his line was exposed by 1 cm.  Patient states line was snagged on item.   Nurse states it flushed well and drew back fine.   Please advise.  Laurell Josephsammy K Kaikoa Magro, RN

## 2017-05-10 NOTE — Telephone Encounter (Signed)
I think since it is still functioning well- would keep line and secure in place as much as possible with tape and ask patient to call if it snags any further

## 2017-05-12 ENCOUNTER — Ambulatory Visit: Payer: Self-pay

## 2017-05-12 NOTE — Telephone Encounter (Signed)
No answer when attempted call

## 2017-05-13 ENCOUNTER — Other Ambulatory Visit: Payer: Self-pay | Admitting: Pharmacist

## 2017-05-17 ENCOUNTER — Emergency Department (HOSPITAL_COMMUNITY): Payer: Medicare Other

## 2017-05-17 ENCOUNTER — Other Ambulatory Visit: Payer: Self-pay

## 2017-05-17 ENCOUNTER — Encounter (HOSPITAL_COMMUNITY): Payer: Self-pay

## 2017-05-17 ENCOUNTER — Telehealth: Payer: Self-pay | Admitting: *Deleted

## 2017-05-17 ENCOUNTER — Emergency Department (HOSPITAL_COMMUNITY)
Admission: EM | Admit: 2017-05-17 | Discharge: 2017-05-17 | Disposition: A | Discharge status: Home / Self Care | Payer: Medicare Other | Attending: Emergency Medicine | Admitting: Emergency Medicine

## 2017-05-17 ENCOUNTER — Emergency Department (HOSPITAL_BASED_OUTPATIENT_CLINIC_OR_DEPARTMENT_OTHER)
Admit: 2017-05-17 | Discharge: 2017-05-17 | Disposition: A | Discharge status: Home / Self Care | Payer: Medicare Other | Attending: Emergency Medicine | Admitting: Emergency Medicine

## 2017-05-17 DIAGNOSIS — R600 Localized edema: Secondary | ICD-10-CM | POA: Diagnosis present

## 2017-05-17 DIAGNOSIS — I10 Essential (primary) hypertension: Secondary | ICD-10-CM | POA: Insufficient documentation

## 2017-05-17 DIAGNOSIS — E119 Type 2 diabetes mellitus without complications: Secondary | ICD-10-CM | POA: Diagnosis not present

## 2017-05-17 DIAGNOSIS — F17210 Nicotine dependence, cigarettes, uncomplicated: Secondary | ICD-10-CM | POA: Diagnosis not present

## 2017-05-17 DIAGNOSIS — M7989 Other specified soft tissue disorders: Secondary | ICD-10-CM

## 2017-05-17 DIAGNOSIS — J45909 Unspecified asthma, uncomplicated: Secondary | ICD-10-CM | POA: Diagnosis not present

## 2017-05-17 DIAGNOSIS — M8628 Subacute osteomyelitis, other site: Secondary | ICD-10-CM

## 2017-05-17 DIAGNOSIS — R29898 Other symptoms and signs involving the musculoskeletal system: Secondary | ICD-10-CM | POA: Diagnosis not present

## 2017-05-17 DIAGNOSIS — Z79899 Other long term (current) drug therapy: Secondary | ICD-10-CM | POA: Insufficient documentation

## 2017-05-17 LAB — COMPREHENSIVE METABOLIC PANEL
ALBUMIN: 3.2 g/dL — AB (ref 3.5–5.0)
ALK PHOS: 96 U/L (ref 38–126)
ALT: 59 U/L (ref 17–63)
AST: 31 U/L (ref 15–41)
Anion gap: 7 (ref 5–15)
BUN: 9 mg/dL (ref 6–20)
CO2: 27 mmol/L (ref 22–32)
CREATININE: 1.03 mg/dL (ref 0.61–1.24)
Calcium: 8.8 mg/dL — ABNORMAL LOW (ref 8.9–10.3)
Chloride: 105 mmol/L (ref 101–111)
GFR calc Af Amer: >60 mL/min (ref >60)
GFR calc non Af Amer: >60 mL/min (ref >60)
GLUCOSE: 139 mg/dL — AB (ref 65–99)
Potassium: 4.1 mmol/L (ref 3.5–5.1)
SODIUM: 139 mmol/L (ref 135–145)
Total Bilirubin: 0.7 mg/dL (ref 0.3–1.2)
Total Protein: 7.7 g/dL (ref 6.5–8.1)

## 2017-05-17 LAB — CBC WITH DIFFERENTIAL/PLATELET
BASOS PCT: 0 %
Basophils Absolute: 0 10*3/uL (ref 0.0–0.1)
EOS ABS: 0.2 10*3/uL (ref 0.0–0.7)
Eosinophils Relative: 3 %
HCT: 41.6 % (ref 39.0–52.0)
HEMOGLOBIN: 13.9 g/dL (ref 13.0–17.0)
LYMPHS ABS: 2.5 10*3/uL (ref 0.7–4.0)
Lymphocytes Relative: 32 %
MCH: 30 pg (ref 26.0–34.0)
MCHC: 33.4 g/dL (ref 30.0–36.0)
MCV: 89.7 fL (ref 78.0–100.0)
MONO ABS: 0.4 10*3/uL (ref 0.1–1.0)
MONOS PCT: 6 %
NEUTROS PCT: 59 %
Neutro Abs: 4.5 10*3/uL (ref 1.7–7.7)
Platelets: 176 10*3/uL (ref 150–400)
RBC: 4.64 MIL/uL (ref 4.22–5.81)
RDW: 13.3 % (ref 11.5–15.5)
WBC: 7.7 10*3/uL (ref 4.0–10.5)

## 2017-05-17 LAB — I-STAT CG4 LACTIC ACID, ED
LACTIC ACID, VENOUS: 0.73 mmol/L (ref 0.5–1.9)
Lactic Acid, Venous: 0.89 mmol/L (ref 0.5–1.9)

## 2017-05-17 LAB — URINALYSIS, ROUTINE W REFLEX MICROSCOPIC
BILIRUBIN URINE: NEGATIVE
Glucose, UA: NEGATIVE mg/dL
HGB URINE DIPSTICK: NEGATIVE
KETONES UR: NEGATIVE mg/dL
Leukocytes, UA: NEGATIVE
NITRITE: NEGATIVE
Protein, ur: NEGATIVE mg/dL
SPECIFIC GRAVITY, URINE: 1.019 (ref 1.005–1.030)
pH: 6 (ref 5.0–8.0)

## 2017-05-17 LAB — VANCOMYCIN, RANDOM: Vancomycin Rm: 9

## 2017-05-17 MED ORDER — LORAZEPAM 2 MG/ML IJ SOLN
1.0000 mg | Freq: Once | INTRAMUSCULAR | Status: AC
  Administered 2017-05-17: 1 mg via INTRAVENOUS
  Filled 2017-05-17: qty 1

## 2017-05-17 MED ORDER — HYDROMORPHONE HCL 1 MG/ML IJ SOLN
1.0000 mg | Freq: Once | INTRAMUSCULAR | Status: AC
  Administered 2017-05-17: 1 mg via INTRAVENOUS
  Filled 2017-05-17: qty 1

## 2017-05-17 MED ORDER — GADOBENATE DIMEGLUMINE 529 MG/ML IV SOLN
20.0000 mL | Freq: Once | INTRAVENOUS | Status: AC | PRN
  Administered 2017-05-17: 20 mL via INTRAVENOUS

## 2017-05-17 NOTE — Discharge Instructions (Signed)
As discussed, your evaluation today has been largely reassuring.  But, it is important that you monitor your condition carefully, and do not hesitate to return to the ED if you develop new, or concerning changes in your condition. ? ?Otherwise, please follow-up with your physician for appropriate ongoing care. ? ?

## 2017-05-17 NOTE — Progress Notes (Signed)
Attempted to get patient to vascular lab for exam, RN could not locate patient at this time.   Hongying Azadeh Hyder (RDMS RVT) 05/17/17 2:41 PM

## 2017-05-17 NOTE — ED Notes (Signed)
Patient at MRI 

## 2017-05-17 NOTE — Progress Notes (Signed)
[facility]  [name] follow pt while here to support transition home.  If patient discharges after hours, please call [phone].   [name] 05/17/2017, 2:32 PM

## 2017-05-17 NOTE — ED Notes (Signed)
Per IV team . Unable to draw labs from PICC line.

## 2017-05-17 NOTE — ED Notes (Signed)
Patient currently at MRI

## 2017-05-17 NOTE — ED Notes (Signed)
unltrasound at bedsdie

## 2017-05-17 NOTE — ED Notes (Signed)
Iv attempted x 2 withput success. 

## 2017-05-17 NOTE — ED Triage Notes (Signed)
PT presents to ED  With complaints of right arm and hand tenderness and swelling. PT has picc line to [facility] for vancomycin d/t osteomyelitis of spine. Some redness present at insertion site. Pt endorses fevers (101), chills, and weakness x 4 days.

## 2017-05-17 NOTE — ED Notes (Signed)
Pt to MR with monitor.

## 2017-05-17 NOTE — ED Provider Notes (Signed)
[facility] EMERGENCY DEPARTMENT Provider Note   CSN: [id number] Arrival date & time: 05/17/17  1138     History   Chief Complaint No chief complaint on file.   HPI Bryan Gallegos is a 42 y.o. male.  HPI Patient presents with concern of fever, back pain, right lower extremity weakness and right arm swelling. Patient has multiple medical issues, and recent diagnosis of osteomyelitis of the spine He has been receiving vancomycin therapy through a right sided PICC line for the most 2 weeks. He notes that over the past few days he has had recurrence of pain similar to that which she was experiencing prior to his diagnosis Pain is focally in the mid thorax, severe, sharp, worse with motion. Not improved with home Percocet. In addition, the patient has had swelling in the right upper extremity, over the past 3 days, and new right lower extremity weakness. No new fall, trauma. Patient notes that he is providing his vancomycin as directed. Patient has home health services, but provides the medicine himself. In addition to these complaints, the patient also had a fever yesterday, with a maximum temperature of 102. Today, after home health services saw his edematous right upper extremity, he was sent here for evaluation.  Patient acknowledges prior IV drug use, denies any use in the past 2 months.  Past Medical History:  Diagnosis Date  . Adult ADHD   . Anaphylactic reaction   . Anxiety   . Asthma   . Bipolar disorder ([place])   . Complication of anesthesia    Per patient difficult intubation;  . Diabetes mellitus   . Difficult intubation    Per patient  . Drug overdose   . Heart valve disorder    s/p echocardiogram  . Hyperlipidemia   . Hypertension   . Morbidly obese ([place])   . OSA (obstructive sleep apnea)   . Polysubstance abuse ([place])   . Transient cerebral ischemia    Unknown    Patient Active Problem List   Diagnosis Date Noted  . Acute midline  low back pain   . Bipolar affective disorder ([place])   . Vertebral osteomyelitis ([place]) 05/01/2017  . MDD (major depressive disorder), recurrent severe, without psychosis ([place]) 02/09/2017  . Cellulitis of left lower extremity   . Hand abscess   . IVDU (intravenous drug user)   . Hepatitis C infection   . Cellulitis in diabetic foot ([place]) 01/10/2017  . Diabetic foot ulcer ([place]) 01/10/2017  . Hypotension 11/11/2016  . Suicide attempt ([place]) 10/17/2016  . Acute and chronic respiratory failure with hypercapnia ([place])   . Pulmonary edema   . Syncope   . Respiratory failure ([place]) 01/04/2016  . Drug overdose   . Acute encephalopathy 01/01/2016  . Hyponatremia 01/01/2016  . Chronic pain syndrome 10/22/2015  . Healthcare maintenance 10/22/2015  . Hoarse voice quality 10/11/2015  . MDD (major depressive disorder), recurrent episode, moderate ([place]) 09/04/2015  . Polysubstance dependence including opioid type drug, episodic abuse ([place]) 09/04/2015  . RUQ abdominal pain   . Cholelithiasis 05/17/2015  . Antisocial personality disorder ([place]) 04/11/2015  . Opioid use disorder, severe, dependence ([place]) 04/11/2015  . Benzodiazepine dependence ([place]) 04/11/2015  . Tobacco use disorder 04/10/2015  . Narcotic dependency, continuous ([place]) 03/14/2014  . Chronic back pain greater than 3 months duration 01/28/2014  . Gastroesophageal reflux disease with esophagitis 01/28/2014  . Essential hypertension, benign 01/28/2014  . Diabetes mellitus type II, controlled ([place]) 01/28/2014  . Bipolar 1 disorder,  mixed, moderate ([place]) 01/28/2014  . Morbid obesity ([place]) 01/28/2014  . Reactive depression 09/02/2013  . TIA (transient ischemic attack) 09/02/2013  . HLD (hyperlipidemia) 08/06/2013  . OSA on CPAP 08/06/2013    Past Surgical History:  Procedure Laterality Date  . CARPAL TUNNEL RELEASE    . ESOPHAGOGASTRODUODENOSCOPY N/A 04/05/2015   Procedure: ESOPHAGOGASTRODUODENOSCOPY (EGD);  Surgeon: Rogene Houston, MD;   Location: [facility];  Service: Endoscopy;  Laterality: N/A;  . ESOPHAGOGASTRODUODENOSCOPY (EGD) WITH PROPOFOL N/A 05/21/2015   Procedure: ESOPHAGOGASTRODUODENOSCOPY (EGD) WITH PROPOFOL;  Surgeon: Ladene Artist, MD;  Location: [facility];  Service: Endoscopy;  Laterality: N/A;  . NOSE SURGERY    . TOOTH EXTRACTION    . WISDOM TOOTH EXTRACTION         Home Medications    Prior to Admission medications   Medication Sig Start Date End Date Taking? Authorizing Provider  albuterol (PROVENTIL HFA;VENTOLIN HFA) 108 (90 Base) MCG/ACT inhaler Inhale 1-2 puffs into the lungs every 6 (six) hours as needed for wheezing or shortness of breath. 02/10/17  Yes Lindell Spar I, NP  atorvastatin (LIPITOR) 20 MG tablet Take 1 tablet (20 mg total) by mouth at bedtime. For high cholesterol 02/10/17  Yes Nwoko, Herbert Pun I, NP  cefTRIAXone (ROCEPHIN) IVPB Inject 2 g into the vein daily. Indication:  Vertebral osteomyelitis Last Day of Therapy:  06/25/17 Labs - Once weekly:  CBC/D and BMP, Labs - Every other week:  ESR and CRP 05/05/17 06/25/17 Yes Chundi, Vahini, MD  COMBIVENT RESPIMAT 20-100 MCG/ACT AERS respimat Inhale 1 puff into the lungs every 6 (six) hours as needed for wheezing or shortness of breath. 02/10/17  Yes Lindell Spar I, NP  gabapentin (NEURONTIN) 400 MG capsule Take 2 capsules (800 mg total) by mouth 3 (three) times daily. For agitation/Neuropathy 02/10/17  Yes Lindell Spar I, NP  hydrOXYzine (ATARAX/VISTARIL) 25 MG tablet Take 1 tablet (25 mg total) by mouth every 6 (six) hours as needed for anxiety. 02/10/17  Yes Lindell Spar I, NP  omeprazole (PRILOSEC) 40 MG capsule Take 1 capsule (40 mg total) by mouth 2 (two) times daily. For acid reflux 02/10/17  Yes Nwoko, Herbert Pun I, NP  tiZANidine (ZANAFLEX) 4 MG capsule Take 4 mg by mouth 3 (three) times daily as needed for muscle spasms. 04/23/17  Yes [provider]  traZODone (DESYREL) 150 MG tablet Take 1 tablet (150 mg total) by mouth at  bedtime as needed for sleep. 02/10/17  Yes Nwoko, Agnes I, NP  vancomycin IVPB Inject 1,500 mg into the vein every 12 (twelve) hours. Indication:  Vertebral Osteomyelitis Last Day of Therapy:  06/25/17 Labs - Sunday/Monday:  CBC/D, BMP, and vancomycin trough. Labs - Thursday:  BMP and vancomycin trough Labs - Every other week:  ESR and CRP 05/05/17 06/25/17 Yes Chundi, Vahini, MD  ziprasidone (GEODON) 20 MG capsule Take 20 mg by mouth 2 (two) times daily with a meal.   Yes [provider]  furosemide (LASIX) 40 MG tablet Take 1 tablet (40 mg total) by mouth daily. For swellings Patient not taking: Reported on 05/01/2017 02/10/17   Lindell Spar I, NP  polyethylene glycol powder (MIRALAX) powder Take 17 g by mouth daily. For constipation Patient not taking: Reported on 05/01/2017 02/10/17   Lindell Spar I, NP  ursodiol (ACTIGALL) 300 MG capsule Take 1 capsule (300 mg total) by mouth 2 (two) times daily. For kidney stone Patient not taking: Reported on 05/01/2017 02/10/17   Encarnacion Slates, NP  lisinopril (PRINIVIL,ZESTRIL) 40 MG tablet Take 1 tablet (40 mg total) by mouth 2 (two) times daily. For high blood pressure 09/05/15 10/01/15  Encarnacion Slates, NP    Family History Family History  Problem Relation Age of Onset  . CAD Mother        Living  . Diabetes Mellitus II Mother   . Stroke Mother   . Hypertension Mother   . Congestive Heart Failure Mother   . Kidney disease Mother   . Fibromyalgia Mother   . Thyroid disease Mother   . Hyperlipidemia Mother   . Liver disease Mother   . Alcoholism Father 68       Deceased  . Arthritis Maternal Grandmother   . Congestive Heart Failure Maternal Grandmother   . Hypertension Maternal Grandmother   . Lung cancer Maternal Grandfather   . Colon cancer Maternal Aunt   . Stomach cancer Maternal Aunt   . Heart disease Other        Paternal & Maternal  . Crohn's disease Other   . Hypertension Other        Paternal & Maternal  . Hypertension Brother         x3  . Hypertension Sister        #1  . Bipolar disorder Sister        #1  . ADD / ADHD Son        x3  . Bipolar disorder Son        x3  . Asperger's syndrome Son     Social History Social History   Tobacco Use  . Smoking status: Current Every Day Smoker    Packs/day: 1.00    Years: 23.00    Pack years: 23.00    Types: Cigarettes  . Smokeless tobacco: Never Used  Substance Use Topics  . Alcohol use: No    Alcohol/week: 0.0 oz  . Drug use: Yes    Types: Benzodiazepines, Heroin, Cocaine     Allergies   Ace inhibitors; Atenolol; Lisinopril; Prednisone; Ibuprofen; and Tylenol [acetaminophen]   Review of Systems Review of Systems  Constitutional:       Per HPI, otherwise negative  HENT:       Per HPI, otherwise negative  Respiratory:       Per HPI, otherwise negative  Cardiovascular:       Per HPI, otherwise negative  Gastrointestinal: Positive for nausea and vomiting.  Endocrine:       Negative aside from HPI  Genitourinary:       Neg aside from HPI   Musculoskeletal:       Per HPI, otherwise negative  Skin: Negative for color change.  Neurological: Positive for weakness. Negative for syncope.     Physical Exam Updated Vital Signs BP (!) 165/87 (BP Location: Left Arm)   Pulse 64   Temp 98.1 F (36.7 C) (Oral)   Resp 16   SpO2 99%   Physical Exam  Constitutional: He is oriented to person, place, and time. He appears well-developed. No distress.  HENT:  Head: Normocephalic and atraumatic.  Eyes: Conjunctivae and EOM are normal.  Cardiovascular: Normal rate and regular rhythm.  Pulmonary/Chest: Effort normal. No stridor. No respiratory distress.  Abdominal: He exhibits no distension.  Musculoskeletal: He exhibits edema.       Arms: Neurological: He is alert and oriented to person, place, and time.  Proximal right lower extremity strength 4/5, contralateral side 5/5, neurologic exam otherwise unremarkable. Patient is ambulatory, with  unremarkable gait.  Skin: Skin is warm and dry.     Psychiatric: He has a normal mood and affect.  Nursing note and vitals reviewed.    ED Treatments / Results  Labs (all labs ordered are listed, but only abnormal results are displayed) Labs Reviewed  COMPREHENSIVE METABOLIC PANEL - Abnormal; Notable for the following components:      Result Value   Glucose, Bld 139 (*)    Calcium 8.8 (*)    Albumin 3.2 (*)    All other components within normal limits  CULTURE, BLOOD (ROUTINE X 2)  CULTURE, BLOOD (ROUTINE X 2)  CBC WITH DIFFERENTIAL/PLATELET  URINALYSIS, ROUTINE W REFLEX MICROSCOPIC  VANCOMYCIN, RANDOM  I-STAT CG4 LACTIC ACID, ED  I-STAT CG4 LACTIC ACID, ED    EKG  EKG Interpretation None       Radiology Dg Chest 2 View  Result Date: 05/17/2017 CLINICAL DATA:  Fever EXAM: CHEST - 2 VIEW COMPARISON:  05/01/2017 FINDINGS: Right arm PICC tip in the proximal SVC. Negative for infiltrate or effusion or heart failure. No mass lesion. IMPRESSION: Right arm PICC tip proximal SVC. No acute cardiopulmonary abnormality. Electronically Signed   By: Franchot Gallo M.D.   On: 05/17/2017 13:18   Mr Thoracic Spine W Wo Contrast  Result Date: 05/17/2017 CLINICAL DATA:  Back pain with new right lower extremity weakness. Recent diagnosis of T11-T12 discitis-osteomyelitis. EXAM: MRI THORACIC AND LUMBAR SPINE WITHOUT AND WITH CONTRAST TECHNIQUE: Multiplanar and multiecho pulse sequences of the thoracic and lumbar spine were obtained without and with intravenous contrast. CONTRAST:  98m MULTIHANCE GADOBENATE DIMEGLUMINE 529 MG/ML IV SOLN COMPARISON:  05/01/2017 FINDINGS: Examination is degraded by motion. There are also artifacts related to patient body habitus. MRI THORACIC SPINE FINDINGS Alignment:  Physiologic. Vertebrae: There is hyperintense T2-weighted signal within the T11 and T12 vertebral bodies. The marrow signal abnormality is slightly greater than on the prior study, particularly at  T11. There is mild T2 hyperintensity of the disc space. The degree of contrast enhancement is unchanged. There is mild enhancement in the prevertebral soft tissues. Cord:  Normal signal and morphology. Paraspinal and other soft tissues: Mild prevertebral signal abnormality at the level T11-T12. Disc levels: No spinal canal stenosis. MRI LUMBAR SPINE FINDINGS Segmentation:  Standard. Alignment:  Physiologic. Vertebrae:  L1 hemangioma, unchanged. Conus medullaris: Extends to the L1 level and appears normal. Paraspinal and other soft tissues: Negative. Disc levels: Unchanged left subarticular disc protrusion at L5-S1 displacing the descending left S1 nerve root. Unchanged mild degenerative disc disease at the other levels. IMPRESSION: 1. Study degraded by motion and artifacts related to patient body habitus. 2. Aside from mild evolution of the bone marrow signal changes at the T11-T12 level, the examination is unchanged from 05/01/2017. 3. Limited assessment of inflammatory changes surrounding the right T11 and T12 nerve roots secondary to patient motion. This is a possible source of the right lower extremity radicular symptoms. 4. Unchanged large left subarticular disc protrusion at L5-S1 that displaces the descending left S1 nerve root. Electronically Signed   By: KUlyses JarredM.D.   On: 05/17/2017 21:37   Mr Lumbar Spine W Wo Contrast  Result Date: 05/17/2017 CLINICAL DATA:  Back pain with new right lower extremity weakness. Recent diagnosis of T11-T12 discitis-osteomyelitis. EXAM: MRI THORACIC AND LUMBAR SPINE WITHOUT AND WITH CONTRAST TECHNIQUE: Multiplanar and multiecho pulse sequences of the thoracic and lumbar spine were obtained without and with intravenous contrast. CONTRAST:  237mMULTIHANCE GADOBENATE DIMEGLUMINE 529 MG/ML IV SOLN  COMPARISON:  05/01/2017 FINDINGS: Examination is degraded by motion. There are also artifacts related to patient body habitus. MRI THORACIC SPINE FINDINGS Alignment:   Physiologic. Vertebrae: There is hyperintense T2-weighted signal within the T11 and T12 vertebral bodies. The marrow signal abnormality is slightly greater than on the prior study, particularly at T11. There is mild T2 hyperintensity of the disc space. The degree of contrast enhancement is unchanged. There is mild enhancement in the prevertebral soft tissues. Cord:  Normal signal and morphology. Paraspinal and other soft tissues: Mild prevertebral signal abnormality at the level T11-T12. Disc levels: No spinal canal stenosis. MRI LUMBAR SPINE FINDINGS Segmentation:  Standard. Alignment:  Physiologic. Vertebrae:  L1 hemangioma, unchanged. Conus medullaris: Extends to the L1 level and appears normal. Paraspinal and other soft tissues: Negative. Disc levels: Unchanged left subarticular disc protrusion at L5-S1 displacing the descending left S1 nerve root. Unchanged mild degenerative disc disease at the other levels. IMPRESSION: 1. Study degraded by motion and artifacts related to patient body habitus. 2. Aside from mild evolution of the bone marrow signal changes at the T11-T12 level, the examination is unchanged from 05/01/2017. 3. Limited assessment of inflammatory changes surrounding the right T11 and T12 nerve roots secondary to patient motion. This is a possible source of the right lower extremity radicular symptoms. 4. Unchanged large left subarticular disc protrusion at L5-S1 that displaces the descending left S1 nerve root. Electronically Signed   By: Ulyses Jarred M.D.   On: 05/17/2017 21:37    Procedures Procedures (including critical care time)  Medications Ordered in ED Medications  HYDROmorphone (DILAUDID) injection 1 mg (not administered)  HYDROmorphone (DILAUDID) injection 1 mg (1 mg Intravenous Given 05/17/17 1734)  LORazepam (ATIVAN) injection 1 mg (1 mg Intravenous Given 05/17/17 1819)  gadobenate dimeglumine (MULTIHANCE) injection 20 mL (20 mLs Intravenous Contrast Given 05/17/17 2040)      Initial Impression / Assessment and Plan / ED Course  I have reviewed the triage vital signs and the nursing notes.  Pertinent labs & imaging results that were available during my care of the patient were reviewed by me and considered in my medical decision making (see chart for details).   In similar condition, aware of initial findings, reassuring labs, no fever.  Given his swelling, ultrasound is pending.  Update: Ultrasound of the right upper extremity does not demonstrate occlusion.  Update: Patient in similar condition  9:51 PM Is return from MRI.  I have reviewed all findings with patient Patient remains afebrile, awake, alert, moving both legs, has been ambulatory.  Patient's MRI findings are reassuring, with no evidence for progression of the infection, there is some swelling which may be accounting for the patient's diminished strength in the right lower extremity, but with otherwise reassuring MRI, labs, ultrasound, vital signs, there is no evidence for new infection, nor bacteremia, sepsis, or low suspicion for other acute new neurologic phenomena. Patient has vancomycin level pending, and his home health team can follow this level, titrate medication as needed. Patient understands importance of following up with his physician, return precautions, and he was discharged in stable condition.  Final Clinical Impressions(s) / ED Diagnoses  Right upper extremity edema Osteomyelitis of the thoracic vertebrae Right lower extremity weakness   Carmin Muskrat, MD 05/17/17 2153

## 2017-05-17 NOTE — Progress Notes (Signed)
Right upper extremity venous duplex has been completed. Negative for DVT.  Results were given to Dr. Jeraldine LootsLockwood.  05/17/17 4:52 PM Olen CordialGreg Axten Pascucci RVT

## 2017-05-17 NOTE — Telephone Encounter (Signed)
[phone] Redmond Pullingarrie Palmer, [facility] RN called to report from patient's home. She states she is there to draw labs today.  Last week his vancomycin was increased and he has been feeling nauseated, ill. She is concerned he may be having side effects from the med increase or may have an illness. She then noted that his right hand is swollen below his PICC, states it looks like a baseball.  RN advised patient needs urgent evaluation for this swelling. Lyla SonCarrie will send him to the ER. Of note, patient has not yet established with [facility], does not have a follow up appointment. Will forward to  Andree CossHowell, Deaunna Olarte M, RN

## 2017-05-18 ENCOUNTER — Telehealth: Payer: Self-pay | Admitting: *Deleted

## 2017-05-18 ENCOUNTER — Other Ambulatory Visit: Payer: Self-pay | Admitting: Pharmacist

## 2017-05-18 NOTE — Telephone Encounter (Signed)
He will need to follow up with the Internal Medicine clinic for additional pain medications as we do not prescribe pain medication.

## 2017-05-18 NOTE — Telephone Encounter (Signed)
Patient left message in Triage at 11:58 stating that he was in intolerable pain, couldn't get out of bed. Patient did not keep his hospital follow up appointment at Internal Medicine last week.  RN returned call, patient was not available - the woman who answered did not have a phone number for him, stated he just left for the store. Andree CossHowell, Haider Hornaday M, RN

## 2017-05-20 ENCOUNTER — Telehealth: Payer: Self-pay

## 2017-05-20 IMAGING — CT CT HEAD CODE STROKE
3 series · 15 of 47 positions shown, 18 images · non-contrast
Comparison: Head CT dated 04/11/2015

CLINICAL DATA: 39-year-old male with right-sided facial droop and
left sided weakness.

EXAM:
CT HEAD WITHOUT CONTRAST
TECHNIQUE: Contiguous axial images were obtained from the base of the skull
through the vertex without intravenous contrast.

[Series 2: head 5.0 st · axial · 0.46mm/px · z∈[-128,+12]mm · 9 of 34 slices shown, 12 images]
[im 3/34  brain]
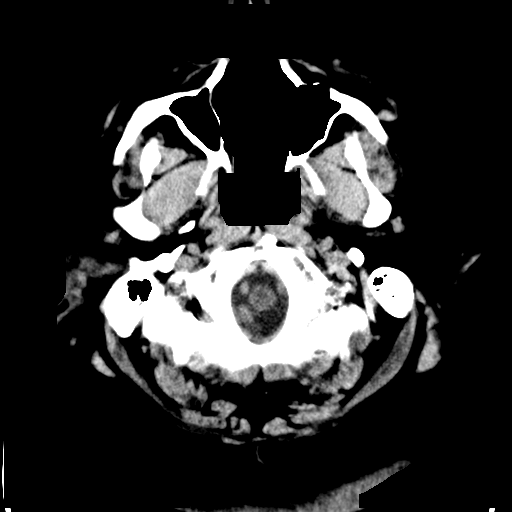
[im 3/34  bone]
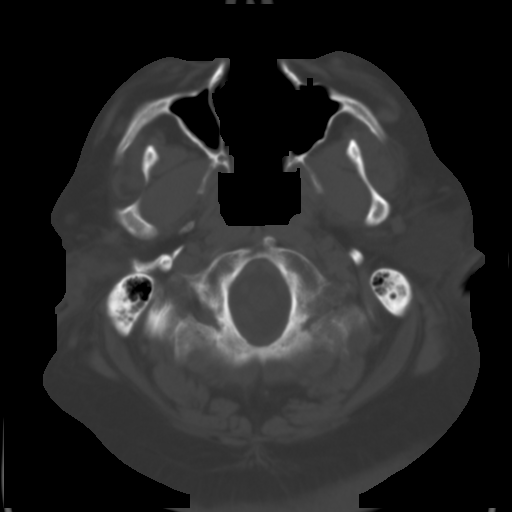
[im 6/34  brain]
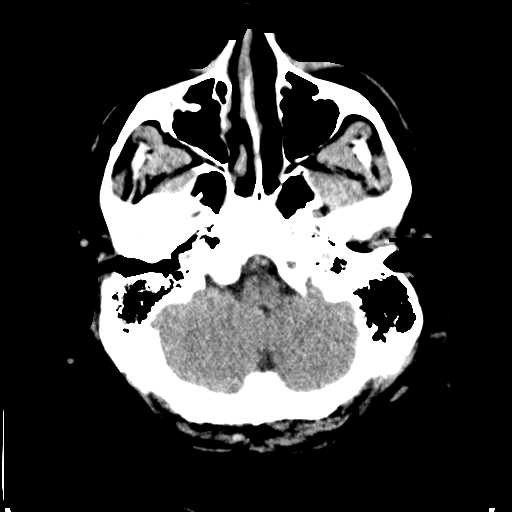
[im 10/34  brain]
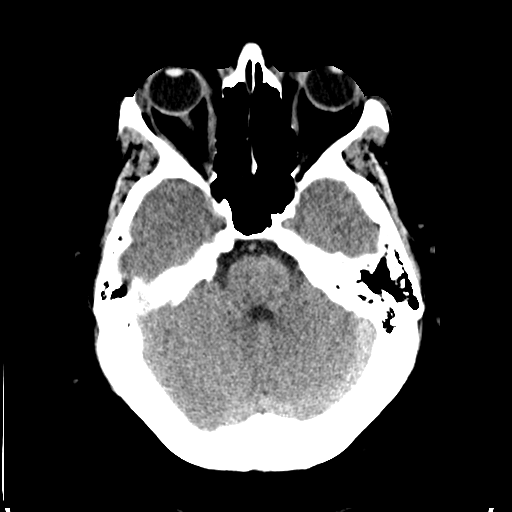
[im 13/34  brain]
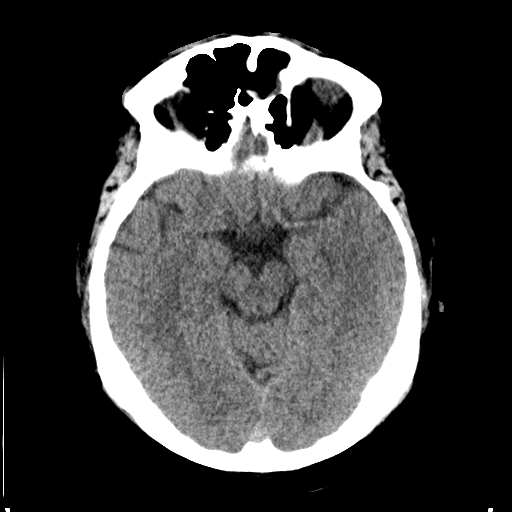
[im 18/34  brain]
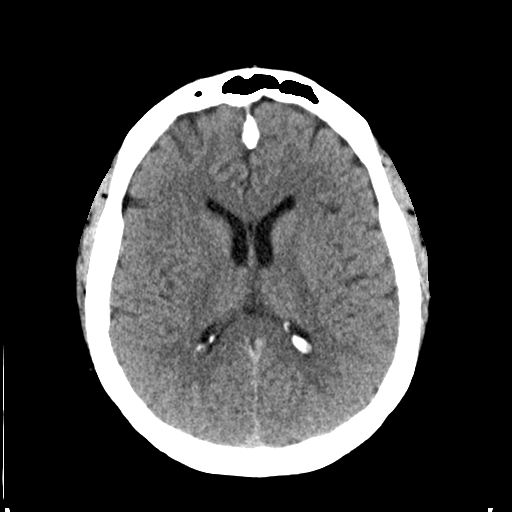
[im 18/34  bone]
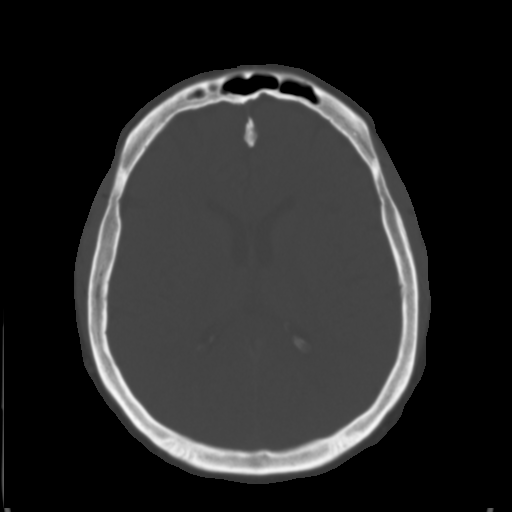
[im 21/34  brain]
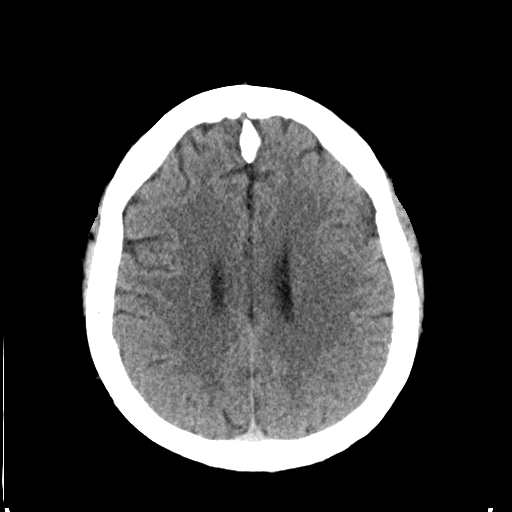
[im 24/34  brain]
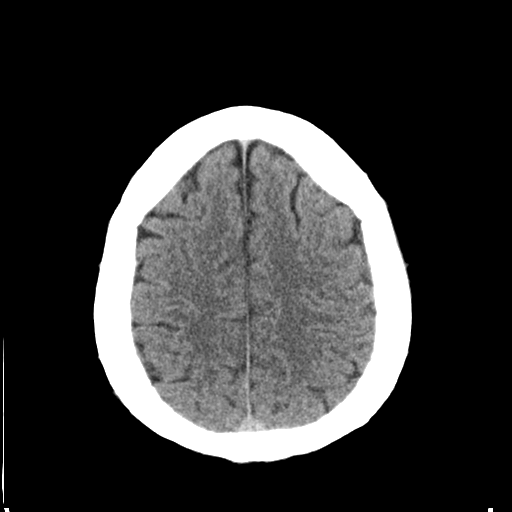
[im 28/34  brain]
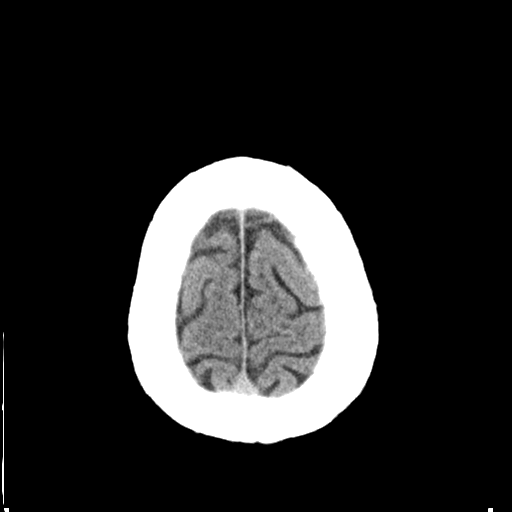
[im 31/34  brain]
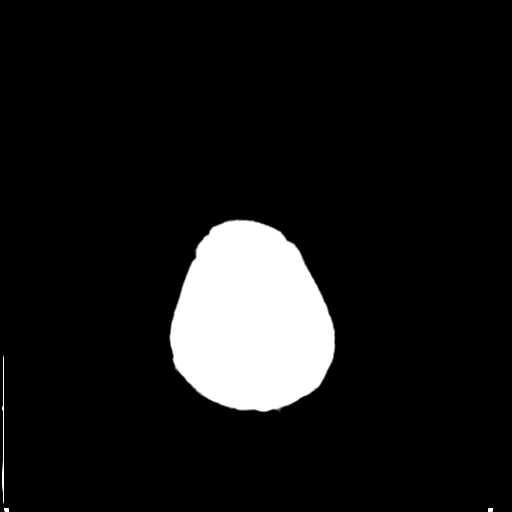
[im 31/34  bone]
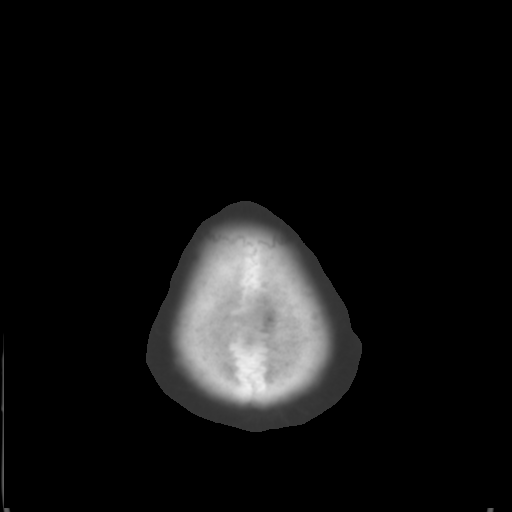

[Series 4: head 3.0 sag st · sagittal · 0.37mm/px · 3 of 67 slices shown]
[im 23/67  brain]
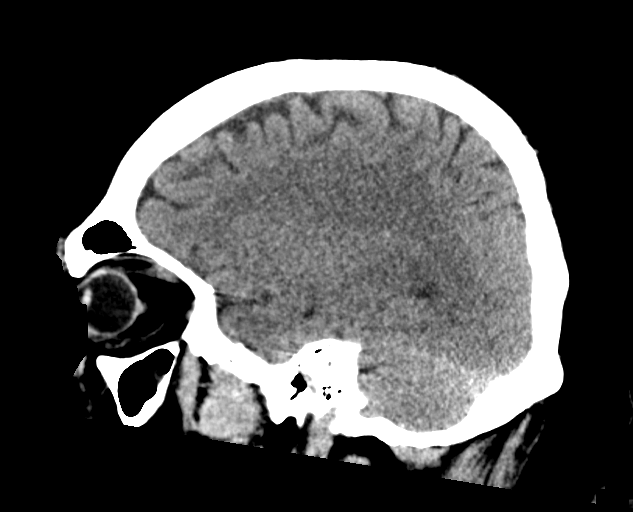
[im 34/67  brain]
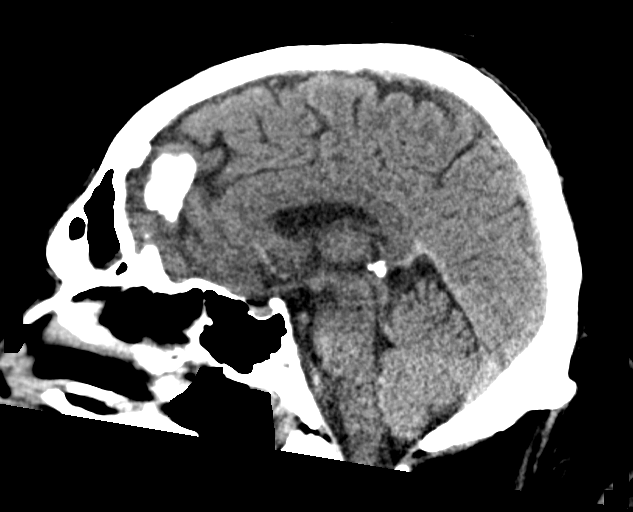
[im 45/67  brain]
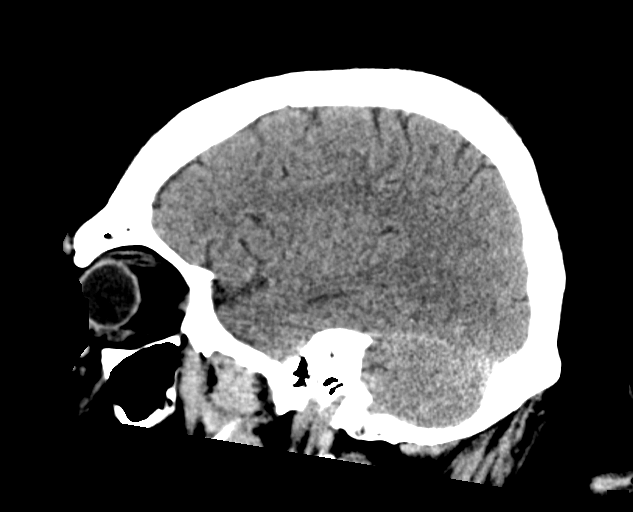

[Series 5: head 3.0 cor st · coronal · 0.33mm/px · 3 of 81 slices shown]
[im 27/81  brain]
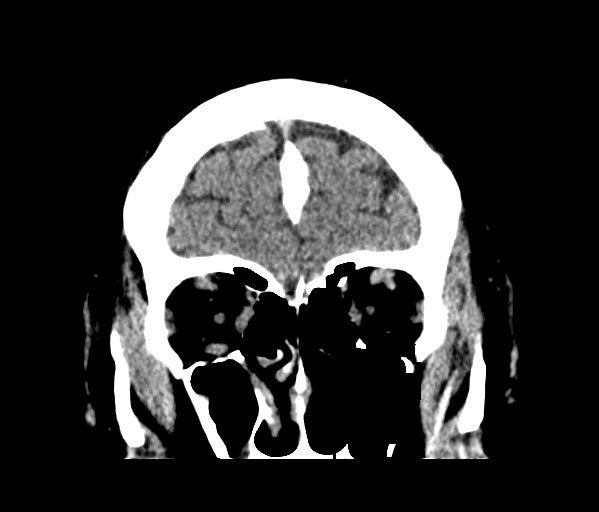
[im 36/81  brain]
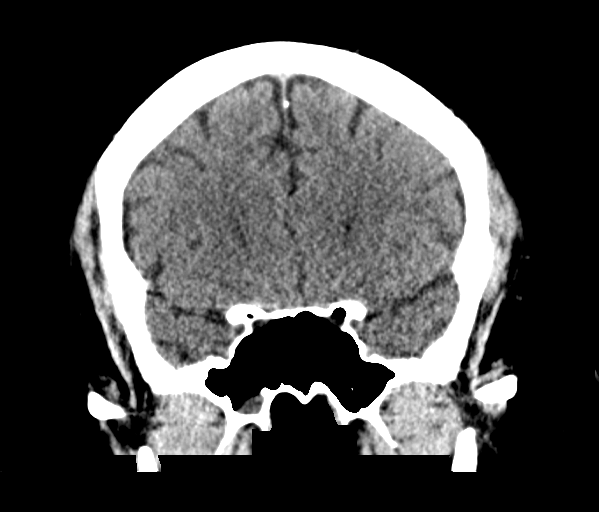
[im 45/81  brain]
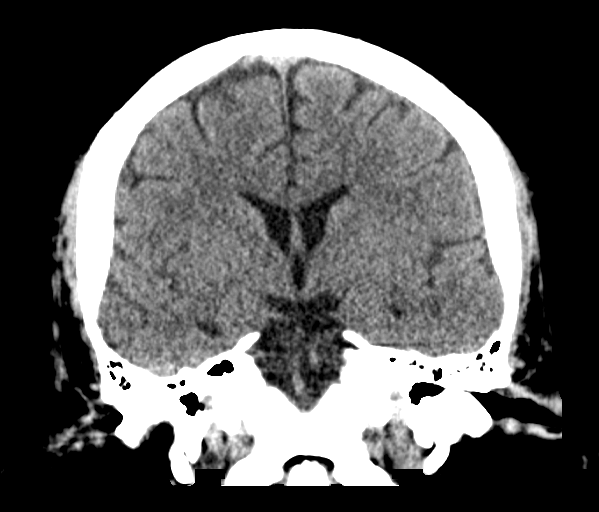

[15 of 47 positions shown; findings below may reference images not displayed]

FINDINGS: The ventricles and the sulci are appropriate in size for the
patient's age. There is no intracranial hemorrhage. No midline shift
or mass effect identified. The gray-white matter differentiation is
preserved. There is mild apparent invasion of the MCAs bilaterally,
likely related to hemoconcentration. There is slight asymmetric
decreased conspicuity of the M2 and M3 segments of the right MCA on
the sagittal images. The more proximal right MCA thrombus is not
excluded. CT angiography or MRI may provide better evaluation.

The visualized paranasal sinuses and mastoid air cells are well
aerated. The calvarium is intact.
IMPRESSION: No acute intracranial hemorrhage.

Slight asymmetric decreased visualization of the distal segments of
the right MCA on the sagittal images. This may be artifactual and
related to patient position in the scanner. A right MCA occlusion is
not excluded. CT angiography or MRI is recommended if there is high
clinical concern for right MCA territory ischemia.

These results were called by telephone at the time of interpretation
on 08/21/2015 at [DATE] to Dr. Afandi who verbally acknowledged
these results.

## 2017-05-20 NOTE — Telephone Encounter (Signed)
Received a call today from [facility] Rn stated that the pt had a doctors appt  and was unable to get his labs done. [facility]yla Sonarrie[facility] stated that they had cancelled the labs ordered. Lorenso CourierJose L Daqwan Dougal, [place]

## 2017-05-22 LAB — CULTURE, BLOOD (ROUTINE X 2)
CULTURE: NO GROWTH
Culture: NO GROWTH
Special Requests: ADEQUATE

## 2017-05-23 ENCOUNTER — Telehealth: Payer: Self-pay

## 2017-05-23 NOTE — Telephone Encounter (Signed)
Lyla Sonarrie , RN with [facility] was not able to draw labs today because patient was out for several doctor's appointments.  She will draw labs on 05-24-17.   Laurell Josephsammy K Nazirah Tri, RN

## 2017-05-24 ENCOUNTER — Telehealth: Payer: Self-pay | Admitting: Behavioral Health

## 2017-05-24 ENCOUNTER — Emergency Department (HOSPITAL_COMMUNITY)
Admission: EM | Admit: 2017-05-24 | Discharge: 2017-05-24 | Discharge status: Against Medical Advice | Payer: Medicare Other | Attending: Emergency Medicine | Admitting: Emergency Medicine

## 2017-05-24 DIAGNOSIS — R55 Syncope and collapse: Secondary | ICD-10-CM | POA: Diagnosis present

## 2017-05-24 DIAGNOSIS — Z5321 Procedure and treatment not carried out due to patient leaving prior to being seen by health care provider: Secondary | ICD-10-CM | POA: Insufficient documentation

## 2017-05-24 NOTE — Telephone Encounter (Signed)
Lyla Sonarrie RN called form [facility] stating she went to draw labs for [name] today.  She states she was able to flush the PICC line but unable to get any blood draw back.  Lyla SonCarrie also stated she noticed that the insertion site was red and the PICC line was exposed about 4cm which it was previously exposed about 3cm and working fine.  She also stated she has educated patient and famiuly on proper PICC are several times.  Lyla SonCarrie states a family member has been changing his PICC dressing and the patient has been getting it wet also.  Lyla SonCarrie stated she advised patient to get evaluated and [facility].   Angeline SlimAshley Hill RN

## 2017-05-25 ENCOUNTER — Emergency Department (HOSPITAL_BASED_OUTPATIENT_CLINIC_OR_DEPARTMENT_OTHER)
Admission: EM | Admit: 2017-05-25 | Discharge: 2017-05-25 | Disposition: A | Discharge status: Home / Self Care | Payer: Medicare Other | Attending: Emergency Medicine | Admitting: Emergency Medicine

## 2017-05-25 ENCOUNTER — Other Ambulatory Visit: Payer: Self-pay

## 2017-05-25 ENCOUNTER — Emergency Department (HOSPITAL_BASED_OUTPATIENT_CLINIC_OR_DEPARTMENT_OTHER): Payer: Medicare Other

## 2017-05-25 ENCOUNTER — Encounter (HOSPITAL_BASED_OUTPATIENT_CLINIC_OR_DEPARTMENT_OTHER): Payer: Self-pay

## 2017-05-25 DIAGNOSIS — Z95828 Presence of other vascular implants and grafts: Secondary | ICD-10-CM | POA: Diagnosis not present

## 2017-05-25 DIAGNOSIS — E119 Type 2 diabetes mellitus without complications: Secondary | ICD-10-CM | POA: Diagnosis not present

## 2017-05-25 DIAGNOSIS — J45909 Unspecified asthma, uncomplicated: Secondary | ICD-10-CM | POA: Insufficient documentation

## 2017-05-25 DIAGNOSIS — R5383 Other fatigue: Secondary | ICD-10-CM | POA: Diagnosis present

## 2017-05-25 DIAGNOSIS — Z79899 Other long term (current) drug therapy: Secondary | ICD-10-CM | POA: Insufficient documentation

## 2017-05-25 DIAGNOSIS — I10 Essential (primary) hypertension: Secondary | ICD-10-CM | POA: Insufficient documentation

## 2017-05-25 DIAGNOSIS — F17210 Nicotine dependence, cigarettes, uncomplicated: Secondary | ICD-10-CM | POA: Diagnosis not present

## 2017-05-25 LAB — URINALYSIS, ROUTINE W REFLEX MICROSCOPIC
Bilirubin Urine: NEGATIVE
GLUCOSE, UA: NEGATIVE mg/dL
Hgb urine dipstick: NEGATIVE
Ketones, ur: NEGATIVE mg/dL
LEUKOCYTES UA: NEGATIVE
Nitrite: NEGATIVE
PH: 6.5 (ref 5.0–8.0)
Protein, ur: NEGATIVE mg/dL
Specific Gravity, Urine: 1.01 (ref 1.005–1.030)

## 2017-05-25 LAB — CBC WITH DIFFERENTIAL/PLATELET
BASOS ABS: 0 10*3/uL (ref 0.0–0.1)
BASOS PCT: 0 %
EOS PCT: 4 %
Eosinophils Absolute: 0.3 10*3/uL (ref 0.0–0.7)
HEMATOCRIT: 38.5 % — AB (ref 39.0–52.0)
Hemoglobin: 12.3 g/dL — ABNORMAL LOW (ref 13.0–17.0)
Lymphocytes Relative: 36 %
Lymphs Abs: 2.6 10*3/uL (ref 0.7–4.0)
MCH: 29.2 pg (ref 26.0–34.0)
MCHC: 31.9 g/dL (ref 30.0–36.0)
MCV: 91.4 fL (ref 78.0–100.0)
MONO ABS: 0.6 10*3/uL (ref 0.1–1.0)
MONOS PCT: 8 %
Neutro Abs: 3.6 10*3/uL (ref 1.7–7.7)
Neutrophils Relative %: 52 %
PLATELETS: 215 10*3/uL (ref 150–400)
RBC: 4.21 MIL/uL — ABNORMAL LOW (ref 4.22–5.81)
RDW: 13.2 % (ref 11.5–15.5)
WBC: 7.1 10*3/uL (ref 4.0–10.5)

## 2017-05-25 LAB — COMPREHENSIVE METABOLIC PANEL
ALBUMIN: 3.4 g/dL — AB (ref 3.5–5.0)
ALK PHOS: 68 U/L (ref 38–126)
ALT: 26 U/L (ref 17–63)
AST: 28 U/L (ref 15–41)
Anion gap: 8 (ref 5–15)
BILIRUBIN TOTAL: 0.4 mg/dL (ref 0.3–1.2)
BUN: 12 mg/dL (ref 6–20)
CALCIUM: 8.5 mg/dL — AB (ref 8.9–10.3)
CO2: 29 mmol/L (ref 22–32)
Chloride: 99 mmol/L — ABNORMAL LOW (ref 101–111)
Creatinine, Ser: 1.01 mg/dL (ref 0.61–1.24)
GFR calc Af Amer: >60 mL/min (ref >60)
GFR calc non Af Amer: >60 mL/min (ref >60)
GLUCOSE: 117 mg/dL — AB (ref 65–99)
Potassium: 3.9 mmol/L (ref 3.5–5.1)
SODIUM: 136 mmol/L (ref 135–145)
TOTAL PROTEIN: 7 g/dL (ref 6.5–8.1)

## 2017-05-25 LAB — I-STAT CG4 LACTIC ACID, ED: Lactic Acid, Venous: 0.94 mmol/L (ref 0.5–1.9)

## 2017-05-25 MED ORDER — OXYCODONE HCL 5 MG PO TABS
5.0000 mg | ORAL_TABLET | Freq: Once | ORAL | Status: AC
  Administered 2017-05-25: 5 mg via ORAL
  Filled 2017-05-25: qty 1

## 2017-05-25 MED ORDER — SODIUM CHLORIDE 0.9 % IV BOLUS
1000.0000 mL | Freq: Once | INTRAVENOUS | Status: AC
  Administered 2017-05-25: 1000 mL via INTRAVENOUS

## 2017-05-25 MED ORDER — ONDANSETRON HCL 4 MG/2ML IJ SOLN: 4.0000 mg | Freq: Once | INTRAMUSCULAR | Status: DC

## 2017-05-25 NOTE — ED Provider Notes (Signed)
[facility] EMERGENCY DEPARTMENT Provider Note   CSN: [id number] Arrival date & time: 05/25/17  1116     History   Chief Complaint Chief Complaint  Patient presents with  . Vascular Access Problem    HPI Bryan Gallegos is a 42 y.o. male.  HPI   42yo male with history of osteomyelitis with PICC line in place receiving vancomycin and ceftriaxone presents at the request of home care given concern for redness around the PICC site.  [facility] was unable to draw back on PICC but was able to flush it. Reported PICC was exposed 4cm and was 3cm before. Pt feels PICC is exposed the same amount. He admits to changing the dressing once himself but reports otherwise he allows home health to do it. Reports he has continuing fatigue and nausea, continuing back pain.  No known fevers. Has had chills. Has also had diarrhea approx 5 times per day for the last 3-4 days.  Past Medical History:  Diagnosis Date  . Adult ADHD   . Anaphylactic reaction   . Anxiety   . Asthma   . Bipolar disorder ([place])   . Complication of anesthesia    Per patient difficult intubation;  . Diabetes mellitus   . Difficult intubation    Per patient  . Drug overdose   . Heart valve disorder    s/p echocardiogram  . Hyperlipidemia   . Hypertension   . Morbidly obese ([place])   . OSA (obstructive sleep apnea)   . Polysubstance abuse ([place])   . Transient cerebral ischemia    Unknown    Patient Active Problem List   Diagnosis Date Noted  . Acute midline low back pain   . Bipolar affective disorder ([place])   . Vertebral osteomyelitis ([place]) 05/01/2017  . MDD (major depressive disorder), recurrent severe, without psychosis ([place]) 02/09/2017  . Cellulitis of left lower extremity   . Hand abscess   . IVDU (intravenous drug user)   . Hepatitis C infection   . Cellulitis in diabetic foot ([place]) 01/10/2017  . Diabetic foot ulcer ([place]) 01/10/2017  . Hypotension 11/11/2016  . Suicide attempt ([place])  10/17/2016  . Acute and chronic respiratory failure with hypercapnia ([place])   . Pulmonary edema   . Syncope   . Respiratory failure ([place]) 01/04/2016  . Drug overdose   . Acute encephalopathy 01/01/2016  . Hyponatremia 01/01/2016  . Chronic pain syndrome 10/22/2015  . Healthcare maintenance 10/22/2015  . Hoarse voice quality 10/11/2015  . MDD (major depressive disorder), recurrent episode, moderate ([place]) 09/04/2015  . Polysubstance dependence including opioid type drug, episodic abuse ([place]) 09/04/2015  . RUQ abdominal pain   . Cholelithiasis 05/17/2015  . Antisocial personality disorder ([place]) 04/11/2015  . Opioid use disorder, severe, dependence ([place]) 04/11/2015  . Benzodiazepine dependence ([place]) 04/11/2015  . Tobacco use disorder 04/10/2015  . Narcotic dependency, continuous ([place]) 03/14/2014  . Chronic back pain greater than 3 months duration 01/28/2014  . Gastroesophageal reflux disease with esophagitis 01/28/2014  . Essential hypertension, benign 01/28/2014  . Diabetes mellitus type II, controlled ([place]) 01/28/2014  . Bipolar 1 disorder, mixed, moderate ([place]) 01/28/2014  . Morbid obesity ([place]) 01/28/2014  . Reactive depression 09/02/2013  . TIA (transient ischemic attack) 09/02/2013  . HLD (hyperlipidemia) 08/06/2013  . OSA on CPAP 08/06/2013    Past Surgical History:  Procedure Laterality Date  . CARPAL TUNNEL RELEASE    . ESOPHAGOGASTRODUODENOSCOPY N/A 04/05/2015   Procedure: ESOPHAGOGASTRODUODENOSCOPY (EGD);  Surgeon: Mechele Dawley  Laural Golden, MD;  Location: [facility];  Service: Endoscopy;  Laterality: N/A;  . ESOPHAGOGASTRODUODENOSCOPY (EGD) WITH PROPOFOL N/A 05/21/2015   Procedure: ESOPHAGOGASTRODUODENOSCOPY (EGD) WITH PROPOFOL;  Surgeon: Ladene Artist, MD;  Location: [facility];  Service: Endoscopy;  Laterality: N/A;  . NOSE SURGERY    . TOOTH EXTRACTION    . WISDOM TOOTH EXTRACTION          Home Medications    Prior to Admission medications   Medication Sig Start  Date End Date Taking? Authorizing Provider  albuterol (PROVENTIL HFA;VENTOLIN HFA) 108 (90 Base) MCG/ACT inhaler Inhale 1-2 puffs into the lungs every 6 (six) hours as needed for wheezing or shortness of breath. 02/10/17   Lindell Spar I, NP  atorvastatin (LIPITOR) 20 MG tablet Take 1 tablet (20 mg total) by mouth at bedtime. For high cholesterol 02/10/17   Lindell Spar I, NP  cefTRIAXone (ROCEPHIN) IVPB Inject 2 g into the vein daily. Indication:  Vertebral osteomyelitis Last Day of Therapy:  06/25/17 Labs - Once weekly:  CBC/D and BMP, Labs - Every other week:  ESR and CRP 05/05/17 06/25/17  Chundi, Vahini, MD  COMBIVENT RESPIMAT 20-100 MCG/ACT AERS respimat Inhale 1 puff into the lungs every 6 (six) hours as needed for wheezing or shortness of breath. 02/10/17   Lindell Spar I, NP  furosemide (LASIX) 40 MG tablet Take 1 tablet (40 mg total) by mouth daily. For swellings Patient not taking: Reported on 05/01/2017 02/10/17   Lindell Spar I, NP  gabapentin (NEURONTIN) 400 MG capsule Take 2 capsules (800 mg total) by mouth 3 (three) times daily. For agitation/Neuropathy 02/10/17   Lindell Spar I, NP  hydrOXYzine (ATARAX/VISTARIL) 25 MG tablet Take 1 tablet (25 mg total) by mouth every 6 (six) hours as needed for anxiety. 02/10/17   Lindell Spar I, NP  omeprazole (PRILOSEC) 40 MG capsule Take 1 capsule (40 mg total) by mouth 2 (two) times daily. For acid reflux 02/10/17   Lindell Spar I, NP  polyethylene glycol powder (MIRALAX) powder Take 17 g by mouth daily. For constipation Patient not taking: Reported on 05/01/2017 02/10/17   Lindell Spar I, NP  tiZANidine (ZANAFLEX) 4 MG capsule Take 4 mg by mouth 3 (three) times daily as needed for muscle spasms. 04/23/17   [provider]  traZODone (DESYREL) 150 MG tablet Take 1 tablet (150 mg total) by mouth at bedtime as needed for sleep. 02/10/17   Lindell Spar I, NP  ursodiol (ACTIGALL) 300 MG capsule Take 1 capsule (300 mg total) by mouth 2 (two) times  daily. For kidney stone Patient not taking: Reported on 05/01/2017 02/10/17   Lindell Spar I, NP  vancomycin IVPB Inject 1,500 mg into the vein every 12 (twelve) hours. Indication:  Vertebral Osteomyelitis Last Day of Therapy:  06/25/17 Labs - Sunday/Monday:  CBC/D, BMP, and vancomycin trough. Labs - Thursday:  BMP and vancomycin trough Labs - Every other week:  ESR and CRP 05/05/17 06/25/17  Chundi, Verne Spurr, MD  ziprasidone (GEODON) 20 MG capsule Take 20 mg by mouth 2 (two) times daily with a meal.    [provider]  lisinopril (PRINIVIL,ZESTRIL) 40 MG tablet Take 1 tablet (40 mg total) by mouth 2 (two) times daily. For high blood pressure 09/05/15 10/01/15  Encarnacion Slates, NP    Family History Family History  Problem Relation Age of Onset  . CAD Mother        Living  . Diabetes Mellitus II Mother   . Stroke  Mother   . Hypertension Mother   . Congestive Heart Failure Mother   . Kidney disease Mother   . Fibromyalgia Mother   . Thyroid disease Mother   . Hyperlipidemia Mother   . Liver disease Mother   . Alcoholism Father 10       Deceased  . Arthritis Maternal Grandmother   . Congestive Heart Failure Maternal Grandmother   . Hypertension Maternal Grandmother   . Lung cancer Maternal Grandfather   . Colon cancer Maternal Aunt   . Stomach cancer Maternal Aunt   . Heart disease Other        Paternal & Maternal  . Crohn's disease Other   . Hypertension Other        Paternal & Maternal  . Hypertension Brother        x3  . Hypertension Sister        #1  . Bipolar disorder Sister        #1  . ADD / ADHD Son        x3  . Bipolar disorder Son        x3  . Asperger's syndrome Son     Social History Social History   Tobacco Use  . Smoking status: Current Every Day Smoker    Packs/day: 1.00    Years: 23.00    Pack years: 23.00    Types: Cigarettes  . Smokeless tobacco: Never Used  Substance Use Topics  . Alcohol use: No    Alcohol/week: 0.0 oz  . Drug use: Not  Currently    Types: Benzodiazepines, Heroin     Allergies   Ace inhibitors; Atenolol; Lisinopril; Prednisone; Ibuprofen; and Tylenol [acetaminophen]   Review of Systems Review of Systems  Constitutional: Positive for appetite change, chills and fatigue. Negative for fever.  HENT: Negative for sore throat.   Eyes: Negative for visual disturbance.  Respiratory: Negative for shortness of breath.   Cardiovascular: Negative for chest pain.  Gastrointestinal: Positive for diarrhea, nausea and vomiting. Negative for abdominal pain.  Genitourinary: Negative for difficulty urinating.  Musculoskeletal: Positive for back pain. Negative for neck stiffness.  Skin: Negative for rash.  Neurological: Negative for syncope and headaches.     Physical Exam Updated Vital Signs BP (!) 135/91   Pulse 78   Temp 98.2 F (36.8 C) (Oral)   Resp 15   Ht _0  (1.88 m)   Wt (!) 163.3 kg (360 lb)   SpO2 94%   BMI 46.22 kg/m   Physical Exam  Constitutional: He is oriented to person, place, and time. He appears well-developed and well-nourished. No distress.  HENT:  Head: Normocephalic and atraumatic.  Eyes: Conjunctivae and EOM are normal.  Neck: Normal range of motion.  Cardiovascular: Normal rate, regular rhythm, normal heart sounds and intact distal pulses. Exam reveals no gallop and no friction rub.  No murmur heard. Pulmonary/Chest: Effort normal and breath sounds normal. No respiratory distress. He has no wheezes. He has no rales.  Abdominal: Soft. He exhibits no distension. There is no tenderness. There is no guarding.  Musculoskeletal: He exhibits no edema.  Neurological: He is alert and oriented to person, place, and time. GCS eye subscore is 4. GCS verbal subscore is 5. GCS motor subscore is 6.  Reports baseline strength/sensation since initial osteomyelitis diagnosis   Skin: Skin is warm and dry. He is not diaphoretic.  Slight redness 1cm around PICC insertion site. NO surrounding  erythema, no fluctuance No other erythema nor swelling  of arm.   Nursing note and vitals reviewed.    ED Treatments / Results  Labs (all labs ordered are listed, but only abnormal results are displayed) Labs Reviewed  COMPREHENSIVE METABOLIC PANEL - Abnormal; Notable for the following components:      Result Value   Chloride 99 (*)    Glucose, Bld 117 (*)    Calcium 8.5 (*)    Albumin 3.4 (*)    All other components within normal limits  CBC WITH DIFFERENTIAL/PLATELET - Abnormal; Notable for the following components:   RBC 4.21 (*)    Hemoglobin 12.3 (*)    HCT 38.5 (*)    All other components within normal limits  CULTURE, BLOOD (ROUTINE X 2)  CULTURE, BLOOD (ROUTINE X 2)  URINALYSIS, ROUTINE W REFLEX MICROSCOPIC  I-STAT CG4 LACTIC ACID, ED    EKG None  Radiology Dg Chest 2 View  Result Date: 05/25/2017 CLINICAL DATA:  Infection of the PICC line. EXAM: CHEST - 2 VIEW COMPARISON:  Two-view chest x-ray 05/17/2017 FINDINGS: Heart size is normal. The lung volumes are low. There is no edema or effusion. No focal airspace disease is present. The visualized soft tissues and bony thorax are unremarkable. A right-sided PICC line terminates in the upper SVC. IMPRESSION: No active cardiopulmonary disease or significant interval change. Electronically Signed   By: San Morelle M.D.   On: 05/25/2017 15:13    Procedures Procedures (including critical care time)  Medications Ordered in ED Medications  sodium chloride 0.9 % bolus 1,000 mL (0 mLs Intravenous Stopped 05/25/17 1709)  oxyCODONE (Oxy IR/ROXICODONE) immediate release tablet 5 mg (5 mg Oral Given 05/25/17 1524)     Initial Impression / Assessment and Plan / ED Course  I have reviewed the triage vital signs and the nursing notes.  Pertinent labs & imaging results that were available during my care of the patient were reviewed by me and considered in my medical decision making (see chart for details).      42yo  male with history of osteomyelitis with PICC line in place receiving vancomycin and ceftriaxone presents at the request of home care given concern for redness around the PICC site.  The PICC line does flush. XR shows PICC in upper SVC.  Patient afebrile with normal vital signs. He has no WBC, no left shift, normal lactic acid, and CMP without significant findings. He has very small amount of redness directly at insertion site, however does not have signs of abscess or significant cellulitis. He is on vanc/rocephin. We do not have the ability at this facility to change his PICC line and given clinical presentation at this time he does not meet criteria for need for emergent transfer and PICC line change.  Discussed with ID Dr. Megan Salon who is in agreement and recommends continued abx and home health monitoring and follow up as scheduled next week.  Re-drew blood cx, discussed reasons to return. Outpt PICC line change may be arranged as desired by his primary team, but given functional catheter without signs of systemic infection do not feel emergent transfer for exchange is indicated.    Final Clinical Impressions(s) / ED Diagnoses   Final diagnoses:  Status post peripherally inserted central catheter (PICC) central line placement  Other fatigue    ED Discharge Orders    None       Gareth Morgan, MD 05/26/17 1336

## 2017-05-25 NOTE — ED Triage Notes (Signed)
Pt states he was sent for infected right UE PICC line-NAD-presents to triage in w/c-NAD

## 2017-05-27 ENCOUNTER — Other Ambulatory Visit: Payer: Self-pay | Admitting: Pharmacist

## 2017-05-31 LAB — CULTURE, BLOOD (ROUTINE X 2)
Culture: NO GROWTH
Culture: NO GROWTH
SPECIAL REQUESTS: ADEQUATE
Special Requests: ADEQUATE

## 2017-06-01 ENCOUNTER — Ambulatory Visit (INDEPENDENT_AMBULATORY_CARE_PROVIDER_SITE_OTHER): Payer: Medicare Other | Admitting: Internal Medicine

## 2017-06-01 ENCOUNTER — Encounter: Payer: Self-pay | Admitting: Internal Medicine

## 2017-06-01 DIAGNOSIS — M4620 Osteomyelitis of vertebra, site unspecified: Secondary | ICD-10-CM

## 2017-06-01 NOTE — Progress Notes (Signed)
[facility] for Infectious Disease  Patient Active Problem List   Diagnosis Date Noted  . Vertebral osteomyelitis ([place]) 05/01/2017    Priority: High  . Hepatitis C infection     Priority: Medium  . Bipolar affective disorder ([place])   . MDD (major depressive disorder), recurrent severe, without psychosis ([place]) 02/09/2017  . IVDU (intravenous drug user)   . History of suicide attempt 10/17/2016  . Chronic pain syndrome 10/22/2015  . Polysubstance dependence including opioid type drug, episodic abuse ([place]) 09/04/2015  . Cholelithiasis 05/17/2015  . Antisocial personality disorder ([place]) 04/11/2015  . Benzodiazepine dependence ([place]) 04/11/2015  . Tobacco use disorder 04/10/2015  . Gastroesophageal reflux disease with esophagitis 01/28/2014  . Diabetes mellitus type II, controlled ([place]) 01/28/2014  . Morbid obesity ([place]) 01/28/2014  . TIA (transient ischemic attack) 09/02/2013  . HLD (hyperlipidemia) 08/06/2013  . OSA on CPAP 08/06/2013    Patient's Medications  New Prescriptions   No medications on file  Previous Medications   ALBUTEROL (PROVENTIL HFA;VENTOLIN HFA) 108 (90 BASE) MCG/ACT INHALER    Inhale 1-2 puffs into the lungs every 6 (six) hours as needed for wheezing or shortness of breath.   ATORVASTATIN (LIPITOR) 20 MG TABLET    Take 1 tablet (20 mg total) by mouth at bedtime. For high cholesterol   CEFTRIAXONE (ROCEPHIN) IVPB    Inject 2 g into the vein daily. Indication:  Vertebral osteomyelitis Last Day of Therapy:  06/25/17 Labs - Once weekly:  CBC/D and BMP, Labs - Every other week:  ESR and CRP   CLONAZEPAM (KLONOPIN) 1 MG TABLET    TAKE ONE TABLET BY MOUTH TWICE DAILY AS NEEDED panic attacks   COMBIVENT RESPIMAT 20-100 MCG/ACT AERS RESPIMAT    Inhale 1 puff into the lungs every 6 (six) hours as needed for wheezing or shortness of breath.   FUROSEMIDE (LASIX) 40 MG TABLET    Take 1 tablet (40 mg total) by mouth daily. For swellings   GABAPENTIN  (NEURONTIN) 400 MG CAPSULE    Take 2 capsules (800 mg total) by mouth 3 (three) times daily. For agitation/Neuropathy   HYDROXYZINE (ATARAX/VISTARIL) 25 MG TABLET    Take 1 tablet (25 mg total) by mouth every 6 (six) hours as needed for anxiety.   OMEPRAZOLE (PRILOSEC) 40 MG CAPSULE    Take 1 capsule (40 mg total) by mouth 2 (two) times daily. For acid reflux   OXYCODONE-ACETAMINOPHEN (PERCOCET/ROXICET) 5-325 MG TABLET    Take 1 tablet by mouth 3 (three) times daily as needed.   POLYETHYLENE GLYCOL POWDER (MIRALAX) POWDER    Take 17 g by mouth daily. For constipation   TIZANIDINE (ZANAFLEX) 4 MG CAPSULE    Take 4 mg by mouth 3 (three) times daily as needed for muscle spasms.   TRAZODONE (DESYREL) 100 MG TABLET    TAKE TWO TABLETS BY MOUTH 45 minutes BEFORE bed   URSODIOL (ACTIGALL) 300 MG CAPSULE    Take 1 capsule (300 mg total) by mouth 2 (two) times daily. For kidney stone   VANCOMYCIN IVPB    Inject 1,500 mg into the vein every 12 (twelve) hours. Indication:  Vertebral Osteomyelitis Last Day of Therapy:  06/25/17 Labs - Sunday/Monday:  CBC/D, BMP, and vancomycin trough. Labs - Thursday:  BMP and vancomycin trough Labs - Every other week:  ESR and CRP   ZIPRASIDONE (GEODON) 80 MG CAPSULE    TAKE ONE CAPSULE BY MOUTH EVERY evening WITH FOOD  Modified Medications   No medications on file  Discontinued Medications   TIZANIDINE (ZANAFLEX) 4 MG TABLET    Take 4 mg by mouth 3 (three) times daily.   TRAZODONE (DESYREL) 150 MG TABLET    Take 1 tablet (150 mg total) by mouth at bedtime as needed for sleep.   ZIPRASIDONE (GEODON) 20 MG CAPSULE    Take 20 mg by mouth 2 (two) times daily with a meal.    Subjective: Mr. Iqbal is in for his hospital follow-up visit.  He was hospitalized in early March with acute, severe back pain superimposed upon his chronic pain syndrome.  An MRI showed right paraspinous inflammation at the T11-12 level.  An aspirate was performed.  Aspirate Gram stain and culture were  both negative.  Blood cultures were negative.  He has a history of injecting drug use and admitted to injecting drug in January.  He says he injected cocaine.  He denied any injecting drug use since that time.  He was seen by my partners, Rob Scientist, research (physical sciences) and [employer].  They elected to have a PICC placed and recommended treatment with empiric IV vancomycin and ceftriaxone for 8 weeks.  He has now completed 31 days of therapy.  He has not had any problems tolerating his antibiotics.  He is still having severe pain but states that the pain is moderately improved.  He says that he went to his pain doctor at [facility] recently and his pain medication was decreased.  He was seen in the emergency department on 05/17/2017 complaining of fever, continued back pain, right leg weakness and right arm swelling.  No fever was documented in the emergency department.  Blood cultures were negative again.  He had a repeat MRI which showed very little change.  No DVT was noted on right arm ultrasound.  He was seen back in the emergency department again on 05/25/2017 complaining of fatigue and back pain.  No fever was documented.  Blood cultures were repeated again and were negative once more.  Chest x-ray was done and showed no active disease.  [facility]es with [facility] I have had some concerns about his ability to care for the PICC.  He has changed his own dressing on some occasions.  Review of Systems: Review of Systems  Constitutional: Positive for malaise/fatigue. Negative for chills, diaphoresis, fever and weight loss.  Respiratory: Negative for cough.   Cardiovascular: Negative for chest pain.  Gastrointestinal: Negative for abdominal pain, diarrhea, nausea and vomiting.  Musculoskeletal: Positive for back pain.  Skin: Negative for rash.    Past Medical History:  Diagnosis Date  . Adult ADHD   . Anaphylactic reaction   . Anxiety   . Asthma   . Bipolar disorder ([place])   . Complication of  anesthesia    Per patient difficult intubation;  . Diabetes mellitus   . Difficult intubation    Per patient  . Drug overdose   . Heart valve disorder    s/p echocardiogram  . Hyperlipidemia   . Hypertension   . Morbidly obese ([place])   . OSA (obstructive sleep apnea)   . Polysubstance abuse ([place])   . Transient cerebral ischemia    Unknown    Social History   Tobacco Use  . Smoking status: Current Every Day Smoker    Packs/day: 0.50    Years: 23.00    Pack years: 11.50    Types: Cigarettes  . Smokeless tobacco: Never Used  Substance Use Topics  .  Alcohol use: No    Alcohol/week: 0.0 oz  . Drug use: Not Currently    Types: Benzodiazepines, Heroin    Family History  Problem Relation Age of Onset  . CAD Mother        Living  . Diabetes Mellitus II Mother   . Stroke Mother   . Hypertension Mother   . Congestive Heart Failure Mother   . Kidney disease Mother   . Fibromyalgia Mother   . Thyroid disease Mother   . Hyperlipidemia Mother   . Liver disease Mother   . Alcoholism Father 99       Deceased  . Arthritis Maternal Grandmother   . Congestive Heart Failure Maternal Grandmother   . Hypertension Maternal Grandmother   . Lung cancer Maternal Grandfather   . Colon cancer Maternal Aunt   . Stomach cancer Maternal Aunt   . Heart disease Other        Paternal & Maternal  . Crohn's disease Other   . Hypertension Other        Paternal & Maternal  . Hypertension Brother        x3  . Hypertension Sister        #1  . Bipolar disorder Sister        #1  . ADD / ADHD Son        x3  . Bipolar disorder Son        x3  . Asperger's syndrome Son     Allergies  Allergen Reactions  . Ace Inhibitors Anaphylaxis  . Atenolol Anaphylaxis  . Lisinopril Anaphylaxis  . Prednisone Hives  . Ibuprofen Itching, Swelling and Other (See Comments)    Hand/feet swelling   . Tylenol [Acetaminophen] Other (See Comments)    Reaction:  GI bleeding     Objective: Vitals:    06/01/17 1455  BP: (!) 145/87  Pulse: 90  Temp: 98.3 F (36.8 C)  TempSrc: Oral  Weight: (!) 366 lb (166 kg)  Height: _0  (1.88 m)   Body mass index is 46.99 kg/m.  Physical Exam  Constitutional: He is oriented to person, place, and time.  Morbidly obese.  He is very groggy and has difficulty staying awake during exam.  Cardiovascular: Normal rate and regular rhythm.  No murmur heard. Pulmonary/Chest: Effort normal. He has no wheezes. He has no rales.  Musculoskeletal:  No pain with palpation over her spine.  There is no unusual swelling, redness or warmth noted.  Neurological: He is oriented to person, place, and time. Gait normal.  Skin: No rash noted.  Right arm PICC site appears normal.  The dressing was changed on 05/30/2017.    Lab Results 05/24/2017 White blood cell count 7500 Creatinine 0.93 Vancomycin trough 13.6    Problem List Items Addressed This Visit      High   Vertebral osteomyelitis ([place])    I am not surprised that he is still having considerable pain given his chronic pain syndrome, opiate dependence, recent change in pain management and the fact that he is only completed 31 days of antibiotic therapy.  I have some concerns about his ability to manage his PICC.  He denies any injecting drug use since January.  I will continue current treatment and have him follow-up toward the end of planned therapy on 06/24/2017.          Michel Bickers, MD [facility] [phone] pager   [phone] cell 06/01/2017, 3:42 PM

## 2017-06-01 NOTE — Assessment & Plan Note (Signed)
I am not surprised that he is still having considerable pain given his chronic pain syndrome, opiate dependence, recent change in pain management and the fact that he is only completed 31 days of antibiotic therapy.  I have some concerns about his ability to manage his PICC.  He denies any injecting drug use since January.  I will continue current treatment and have him follow-up toward the end of planned therapy on 06/24/2017.

## 2017-06-06 ENCOUNTER — Encounter: Payer: Self-pay | Admitting: Internal Medicine

## 2017-06-06 ENCOUNTER — Telehealth: Payer: Self-pay | Admitting: *Deleted

## 2017-06-06 NOTE — Telephone Encounter (Signed)
Carrie RN from [facility] called to advise that the patient is not being very compliant with his PICC care. She advised he has changed his dressing again. She also states that when she went to visit today he was very sweaty and pale and hard to understand. She has concerns that he is going to make his infection worse and may be using the line for other substances.She has also stopped the pharmacy from sending dressing changes to the house with his supplies in hopes that it stops him from [place] with the line. She just wanted the provider to know.

## 2017-06-09 ENCOUNTER — Encounter: Payer: Self-pay | Admitting: Internal Medicine

## 2017-06-10 ENCOUNTER — Other Ambulatory Visit: Payer: Self-pay | Admitting: Pharmacist

## 2017-06-16 ENCOUNTER — Other Ambulatory Visit: Payer: Self-pay | Admitting: Pharmacist

## 2017-06-23 ENCOUNTER — Telehealth: Payer: Self-pay

## 2017-06-23 NOTE — Telephone Encounter (Signed)
[facility] inform us that patient has been messing with PICC. He has been trying to change the dressing and draw blood from the PICC. Marchelle Folksmanda said that patient has history of drug use. She said educated him on importance of not messing with the PICC.  Towanda OctaveLanatra Jamylah Marinaccio, LPN

## 2017-06-24 ENCOUNTER — Telehealth: Payer: Self-pay | Admitting: Behavioral Health

## 2017-06-24 ENCOUNTER — Other Ambulatory Visit: Payer: Self-pay | Admitting: Pharmacist

## 2017-06-24 ENCOUNTER — Ambulatory Visit: Payer: Self-pay | Admitting: Family

## 2017-06-24 ENCOUNTER — Telehealth: Payer: Self-pay

## 2017-06-24 NOTE — Telephone Encounter (Signed)
Called pt to see how he is doing since he did not make his appt with Marcos EkeGreg Calone, np today. Pt did not pick up, but I did leave vm asking for pt to call our office back to reschedule and update us how he is doing. If pt would like to make an appt he can see Rexene AlbertsStephanie Dixon, NP.   Called [facility] to check and see if they had pt's end date of antibiotics and orders to pull picc. Spoke w/ Debbie who stated that the pt's last day of antibiotics is 06/25/17 and that she did not have a pull order on file. Gave her a verbal order per Tammy SoursGreg to pull picc once last dose of medicine is administered.  Bryan CourierJose L Sumi Gallegos, [place]

## 2017-06-24 NOTE — Telephone Encounter (Signed)
I will try to draw Monday. If still no blood return will need to get him set up with cath flow.  Thank you.

## 2017-06-24 NOTE — Telephone Encounter (Signed)
[place] [place] [facility] stating she was unable to get blood draw back on patients PICC line today and patient was refusing a peripheral stick to draw labs.  So patient did not get lab work today and he missed his appointment with Marcos EkeGreg Calone NP.  Patient has an appointment with Rexene AlbertsStephanie Dixon NP Monday 06/27/2017 and patient aware.   Angeline SlimAshley Hill RN

## 2017-06-26 IMAGING — CR DG CHEST 1V PORT
1 series · 1 of 1 positions shown · non-contrast
Comparison: 04/11/2015

CLINICAL DATA: Overdose. Pt states that he took Oxycodone,
klonopin, and xanax.

EXAM:
PORTABLE CHEST 1 VIEW

[AP]
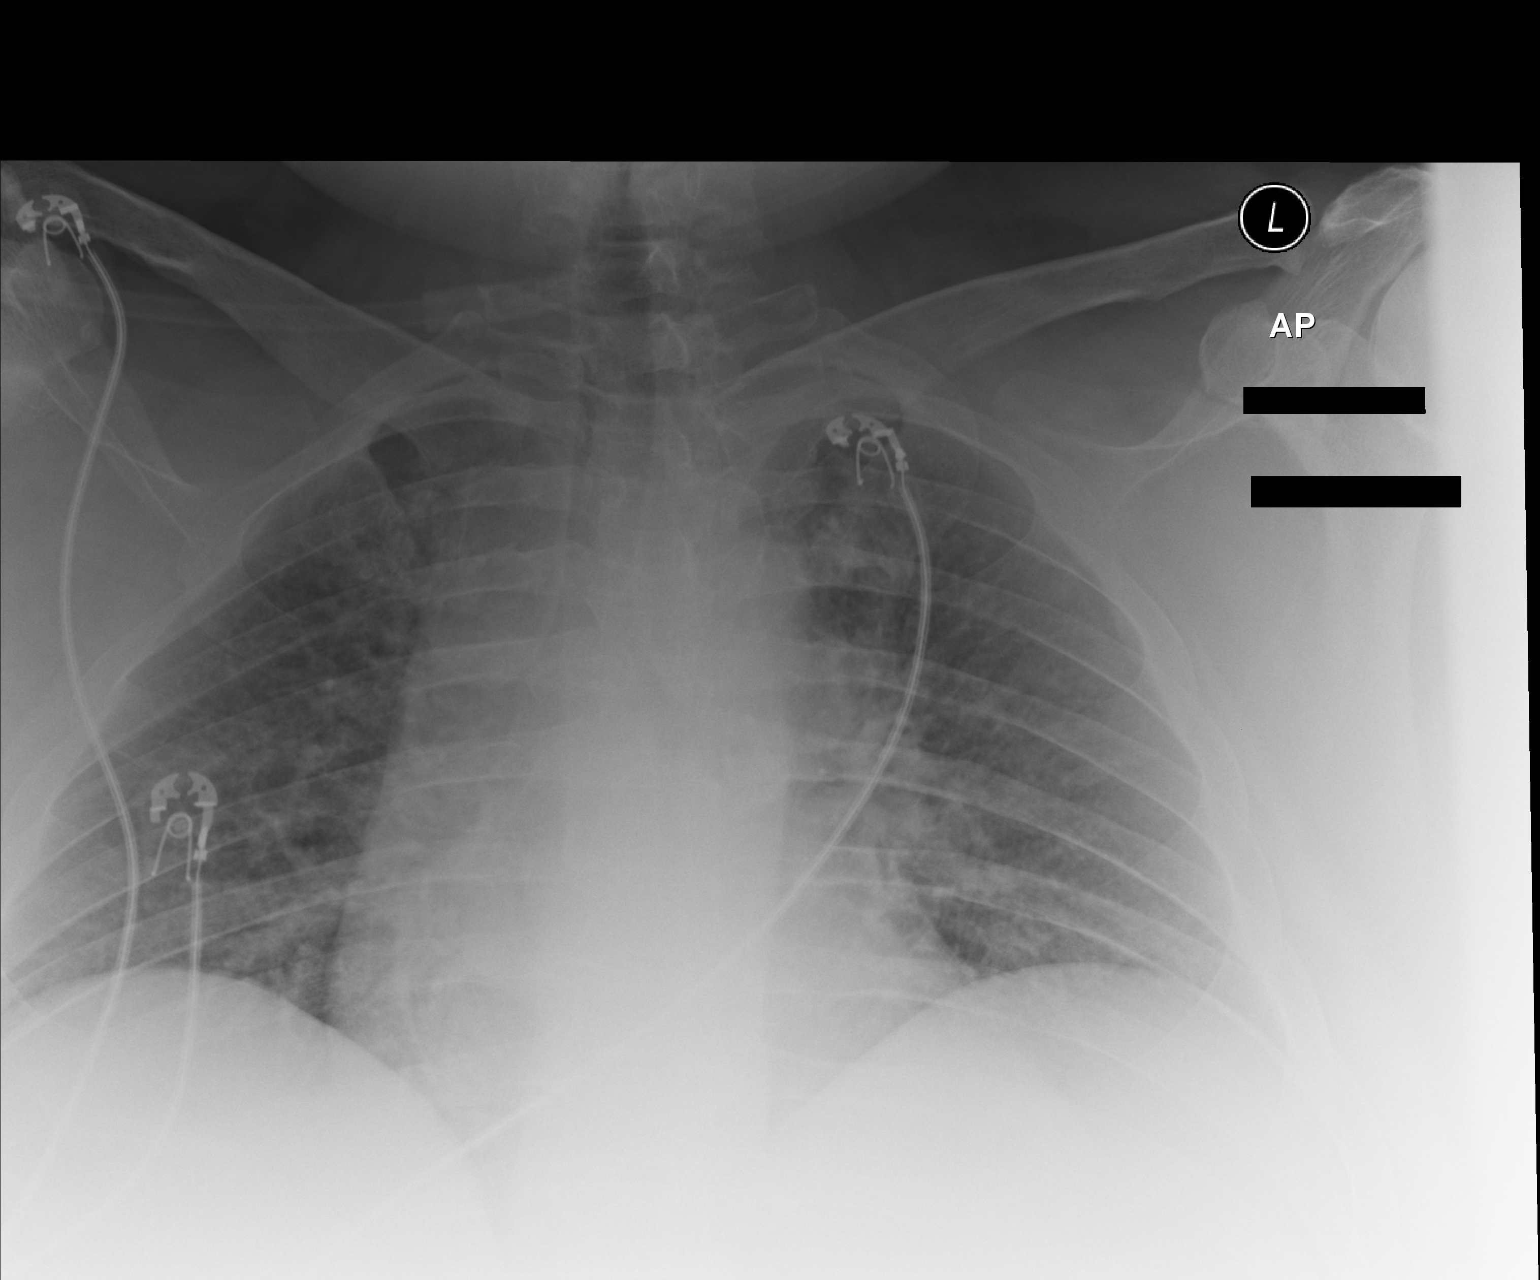

[1 of 1 positions shown; findings below may reference images not displayed]

FINDINGS: Cardiac silhouette is normal in size. No mediastinal or hilar masses
or evidence of adenopathy.

There are low lung volumes. Allowing for this, the lungs are clear.
No pleural effusion or pneumothorax.

Bony thorax is grossly intact.
IMPRESSION: No active disease.

## 2017-06-27 ENCOUNTER — Emergency Department (HOSPITAL_COMMUNITY): Payer: No Typology Code available for payment source

## 2017-06-27 ENCOUNTER — Ambulatory Visit (INDEPENDENT_AMBULATORY_CARE_PROVIDER_SITE_OTHER): Payer: Medicare Other | Admitting: Infectious Diseases

## 2017-06-27 ENCOUNTER — Other Ambulatory Visit: Payer: Self-pay | Admitting: Pharmacist

## 2017-06-27 ENCOUNTER — Encounter: Payer: Self-pay | Admitting: Infectious Diseases

## 2017-06-27 ENCOUNTER — Encounter (HOSPITAL_COMMUNITY): Payer: Self-pay

## 2017-06-27 ENCOUNTER — Emergency Department (HOSPITAL_COMMUNITY)
Admission: EM | Admit: 2017-06-27 | Discharge: 2017-06-27 | Disposition: A | Discharge status: Home / Self Care | Payer: No Typology Code available for payment source | Attending: Emergency Medicine | Admitting: Emergency Medicine

## 2017-06-27 ENCOUNTER — Ambulatory Visit
Admission: RE | Admit: 2017-06-27 | Discharge: 2017-06-27 | Disposition: A | Discharge status: Home / Self Care | Payer: Medicare Other | Source: Ambulatory Visit | Attending: Infectious Diseases | Admitting: Infectious Diseases

## 2017-06-27 VITALS — BP 176/107 | HR 96 | Temp 98.5°F | Wt 354.0 lb

## 2017-06-27 DIAGNOSIS — F17210 Nicotine dependence, cigarettes, uncomplicated: Secondary | ICD-10-CM | POA: Diagnosis not present

## 2017-06-27 DIAGNOSIS — I10 Essential (primary) hypertension: Secondary | ICD-10-CM | POA: Insufficient documentation

## 2017-06-27 DIAGNOSIS — E119 Type 2 diabetes mellitus without complications: Secondary | ICD-10-CM | POA: Insufficient documentation

## 2017-06-27 DIAGNOSIS — F1199 Opioid use, unspecified with unspecified opioid-induced disorder: Secondary | ICD-10-CM | POA: Diagnosis not present

## 2017-06-27 DIAGNOSIS — R51 Headache: Secondary | ICD-10-CM | POA: Insufficient documentation

## 2017-06-27 DIAGNOSIS — M542 Cervicalgia: Secondary | ICD-10-CM | POA: Insufficient documentation

## 2017-06-27 DIAGNOSIS — F1190 Opioid use, unspecified, uncomplicated: Secondary | ICD-10-CM

## 2017-06-27 DIAGNOSIS — J45909 Unspecified asthma, uncomplicated: Secondary | ICD-10-CM | POA: Diagnosis not present

## 2017-06-27 DIAGNOSIS — Z79899 Other long term (current) drug therapy: Secondary | ICD-10-CM | POA: Diagnosis not present

## 2017-06-27 DIAGNOSIS — M4620 Osteomyelitis of vertebra, site unspecified: Secondary | ICD-10-CM | POA: Diagnosis present

## 2017-06-27 DIAGNOSIS — B182 Chronic viral hepatitis C: Secondary | ICD-10-CM

## 2017-06-27 DIAGNOSIS — M7989 Other specified soft tissue disorders: Secondary | ICD-10-CM

## 2017-06-27 LAB — CBC
HEMATOCRIT: 44.4 % (ref 38.5–50.0)
HEMOGLOBIN: 15 g/dL (ref 13.2–17.1)
MCH: 28.5 pg (ref 27.0–33.0)
MCHC: 33.8 g/dL (ref 32.0–36.0)
MCV: 84.4 fL (ref 80.0–100.0)
MPV: 9.7 fL (ref 7.5–12.5)
Platelets: 249 10*3/uL (ref 140–400)
RBC: 5.26 10*6/uL (ref 4.20–5.80)
RDW: 12.4 % (ref 11.0–15.0)
WBC: 7.2 10*3/uL (ref 3.8–10.8)

## 2017-06-27 LAB — COMPREHENSIVE METABOLIC PANEL
AG RATIO: 1.2 (calc) (ref 1.0–2.5)
ALBUMIN MSPROF: 4 g/dL (ref 3.6–5.1)
ALKALINE PHOSPHATASE (APISO): 79 U/L (ref 40–115)
ALT: 55 U/L — ABNORMAL HIGH (ref 9–46)
AST: 49 U/L — AB (ref 10–40)
BILIRUBIN TOTAL: 0.4 mg/dL (ref 0.2–1.2)
BUN: 9 mg/dL (ref 7–25)
CHLORIDE: 103 mmol/L (ref 98–110)
CO2: 29 mmol/L (ref 20–32)
Calcium: 9.2 mg/dL (ref 8.6–10.3)
Creat: 0.9 mg/dL (ref 0.60–1.35)
GLOBULIN: 3.3 g/dL (ref 1.9–3.7)
Glucose, Bld: 119 mg/dL — ABNORMAL HIGH (ref 65–99)
POTASSIUM: 4.6 mmol/L (ref 3.5–5.3)
Sodium: 138 mmol/L (ref 135–146)
Total Protein: 7.3 g/dL (ref 6.1–8.1)

## 2017-06-27 LAB — SEDIMENTATION RATE: SED RATE: 19 mm/h — AB (ref 0–15)

## 2017-06-27 IMAGING — CR DG CHEST 1V PORT
2 series · 2 of 2 positions shown · non-contrast
Comparison: 09/28/2015

CLINICAL DATA: Central line placement.

EXAM:
PORTABLE CHEST 1 VIEW

[AP (1 of 2)]
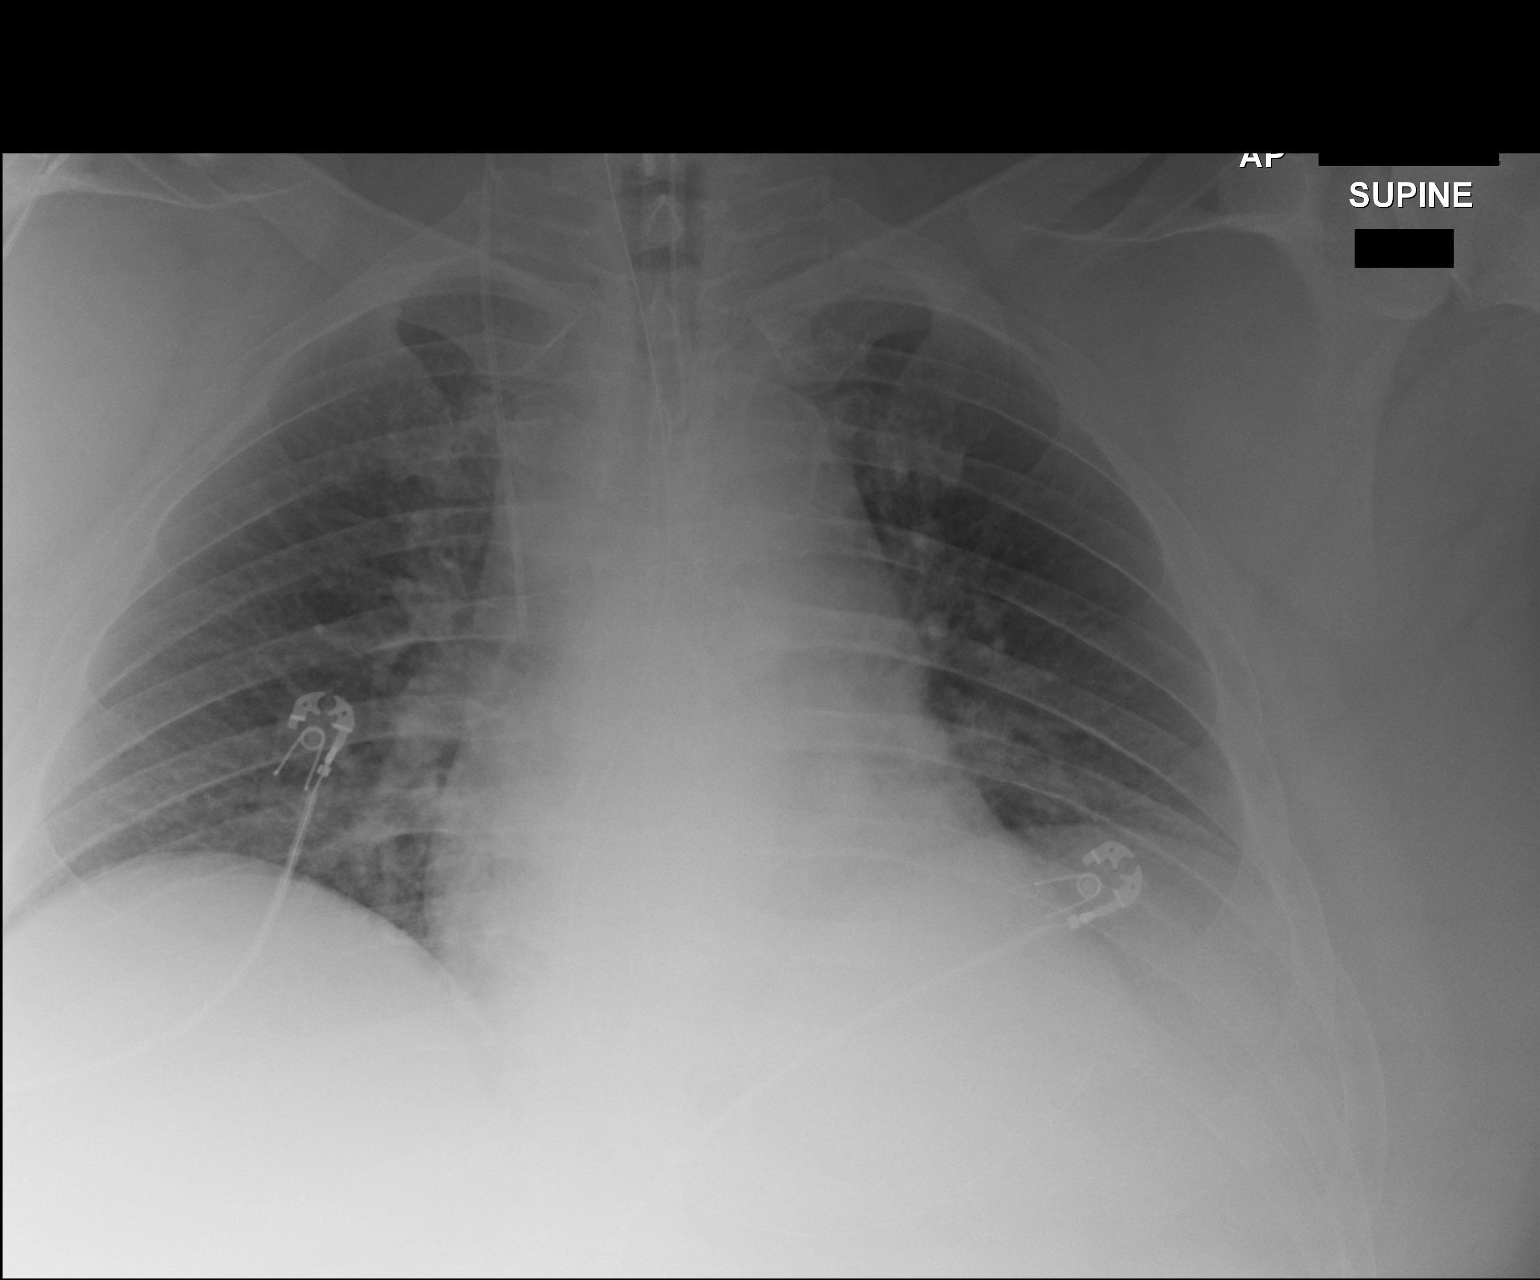

[AP (2 of 2)]
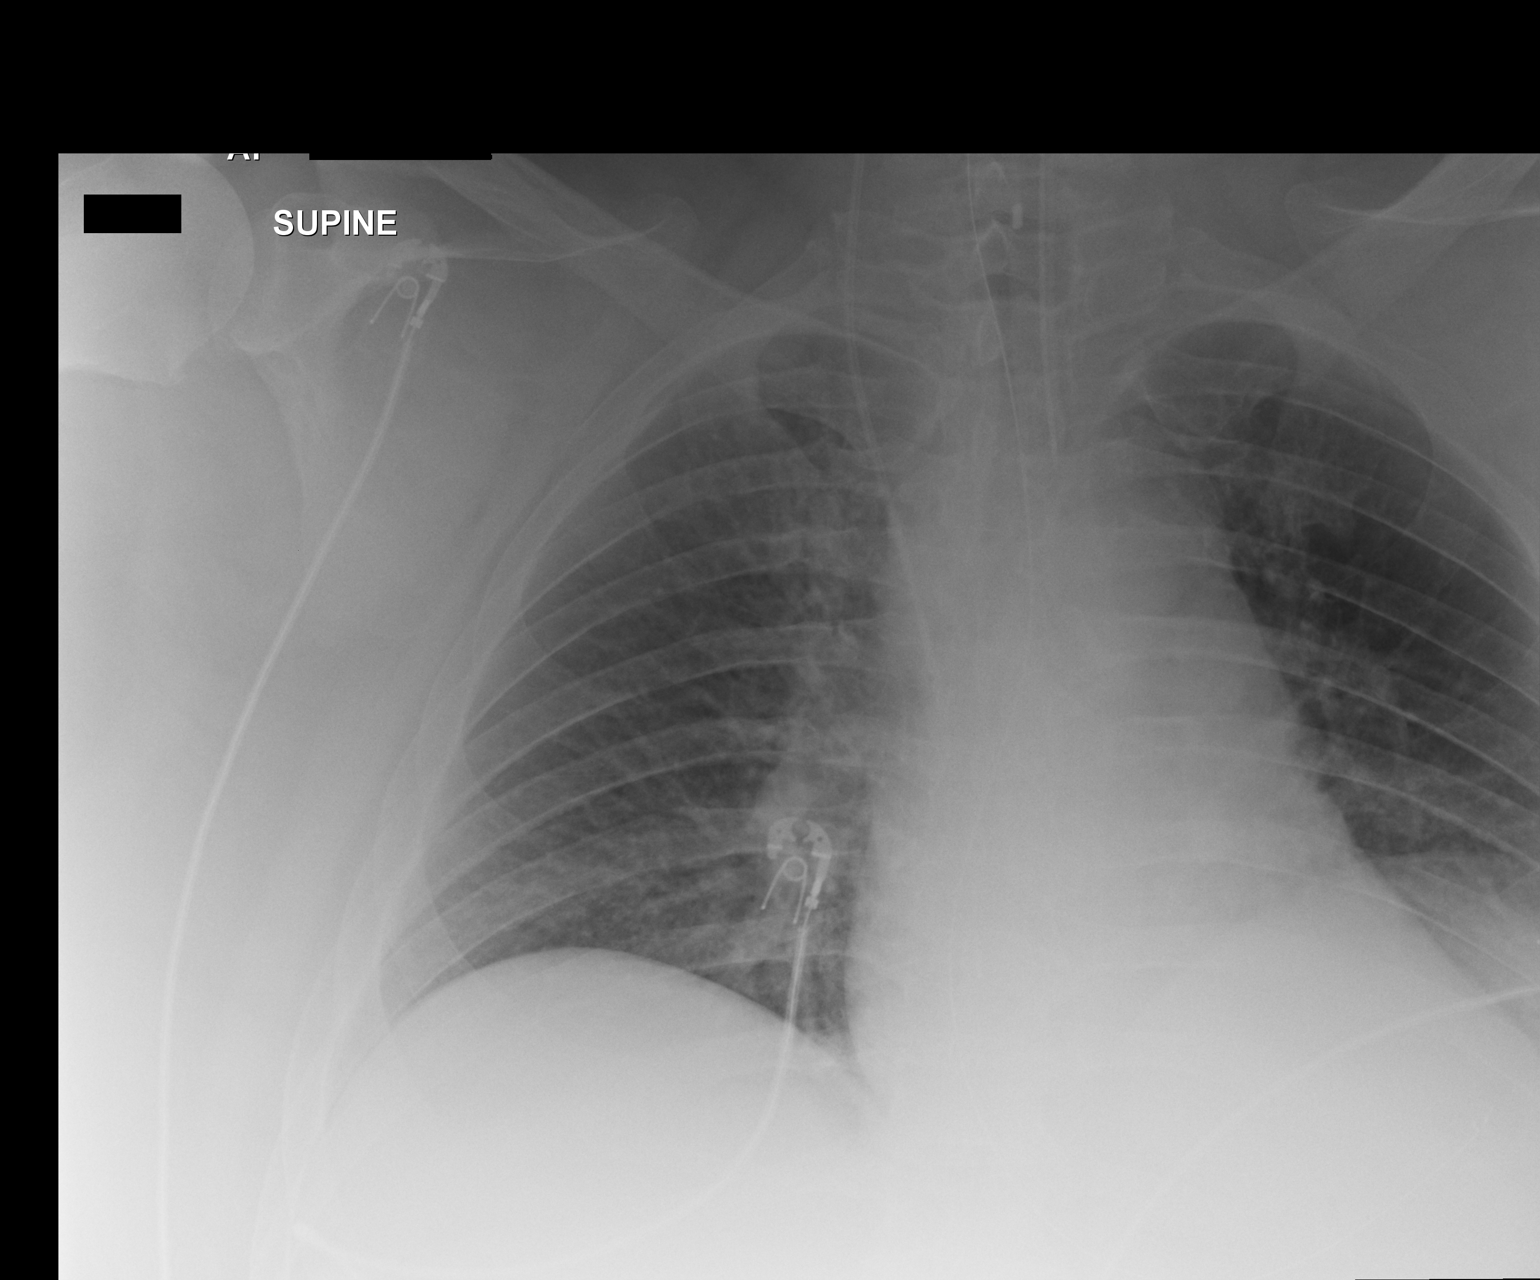

[2 of 2 positions shown; findings below may reference images not displayed]

FINDINGS: Right central line has been placed with the tip in the SVC. No
pneumothorax. Endotracheal tube and NG tube are unchanged. Continued
low lung volumes with bibasilar atelectasis and vascular congestion,
slightly improved since prior study.
IMPRESSION: Right central line tip in the SVC.  No pneumothorax.

Improving bibasilar atelectasis and vascular congestion.

## 2017-06-27 IMAGING — CR DG CHEST 1V PORT
1 series · 1 of 1 positions shown · non-contrast
Comparison: 09/27/2015

CLINICAL DATA: Post intubation.

EXAM:
PORTABLE CHEST 1 VIEW

[AP]
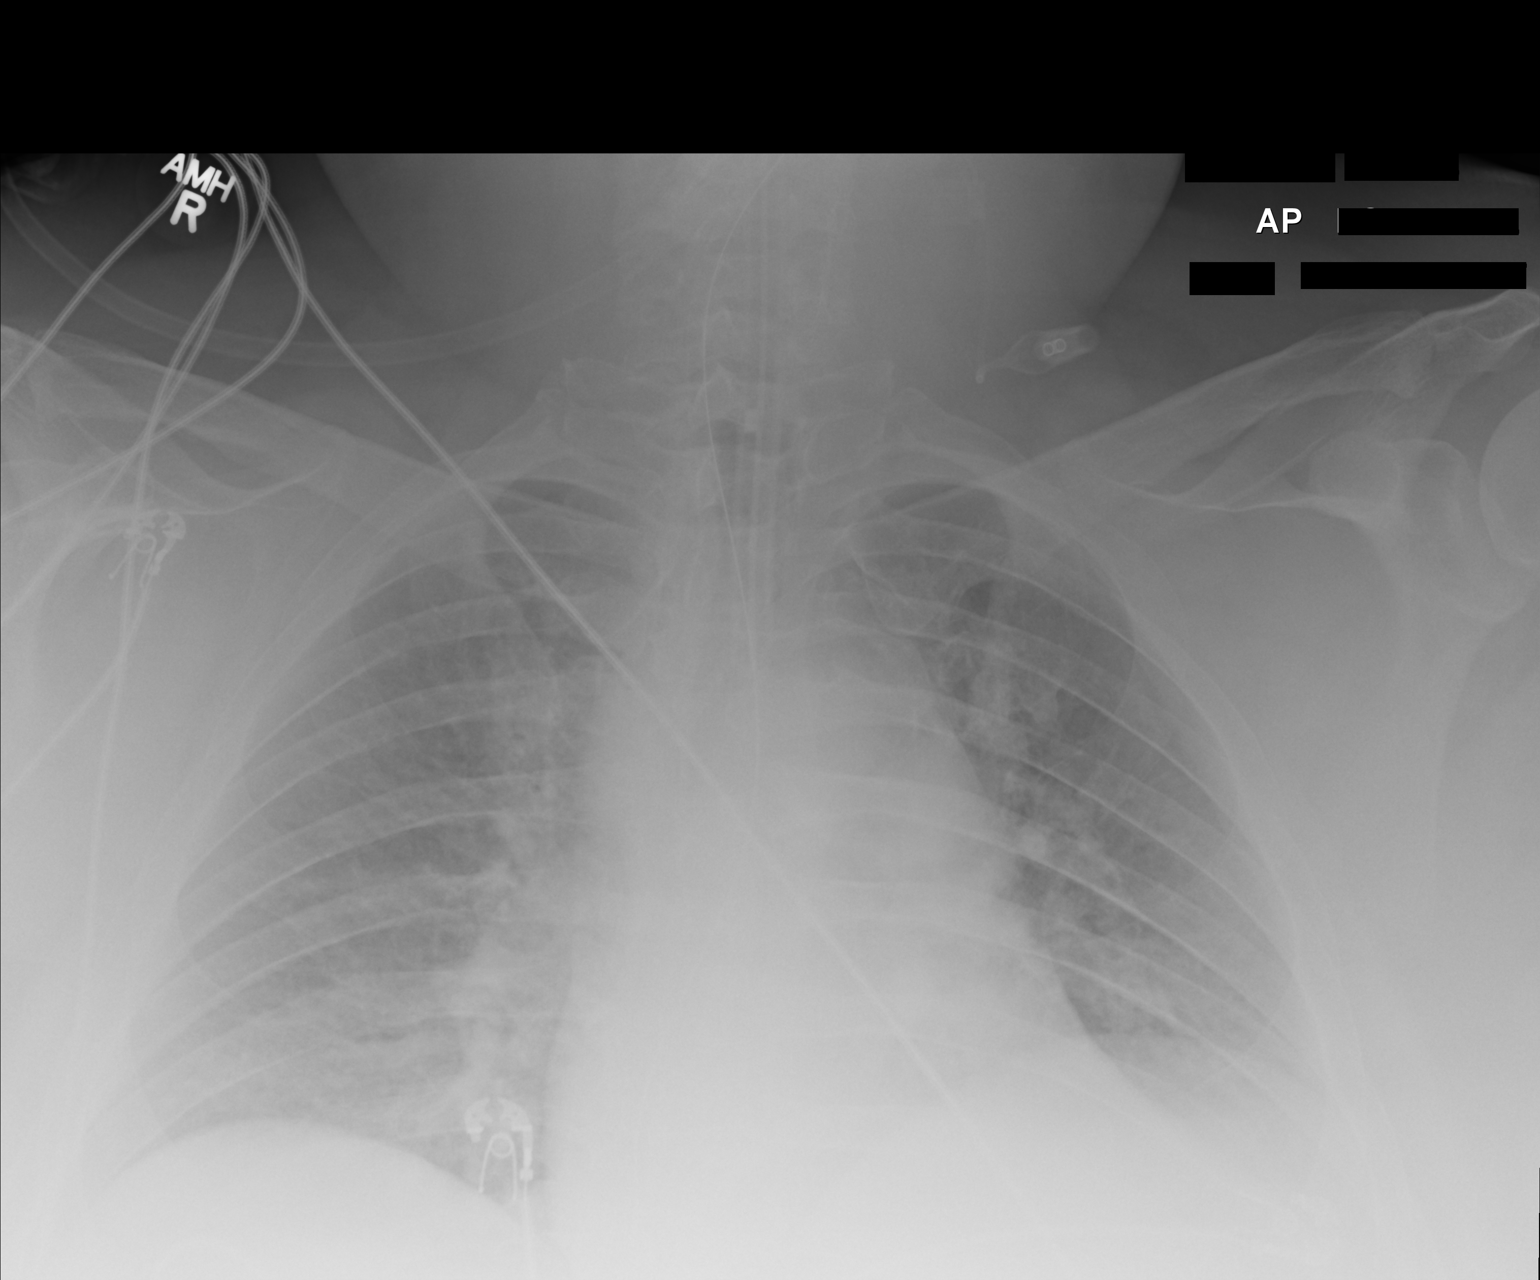

[1 of 1 positions shown; findings below may reference images not displayed]

FINDINGS: Endotracheal tube is 5 cm above the carina. NG tube enters the
stomach. Low lung volumes with bibasilar atelectasis and vascular
congestion. No visible effusions.
IMPRESSION: Endotracheal tube 5 cm above the carina.

Low volumes with vascular congestion and bibasilar atelectasis.

## 2017-06-27 IMAGING — CR DG ABD PORTABLE 1V
1 series · 1 of 1 positions shown · non-contrast
Comparison: CT dated 05/16/2015

CLINICAL DATA: 39-year-old male evaluate for enteric tube
placement.

EXAM:
PORTABLE ABDOMEN - 1 VIEW

[AP]
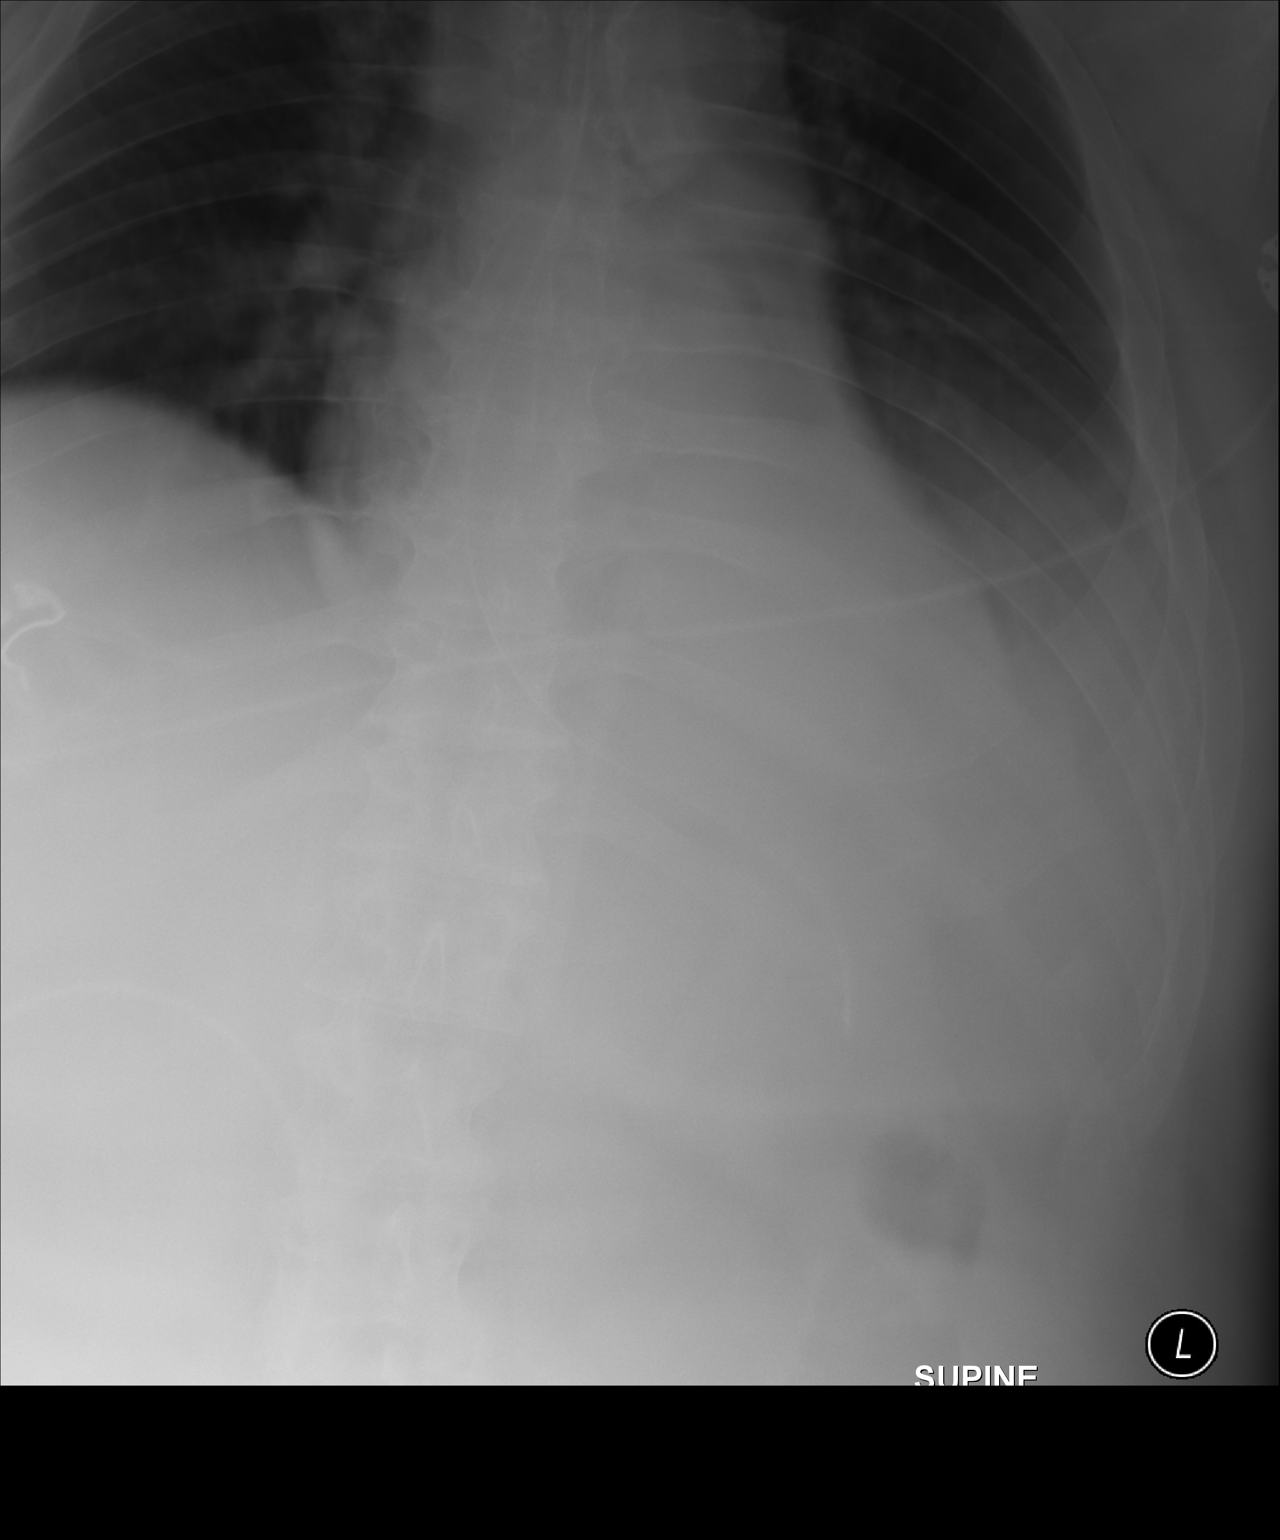

[1 of 1 positions shown; findings below may reference images not displayed]

FINDINGS: An enteric tube is noted with tip in the left upper abdomen likely
in the proximal stomach. No bowel dilatation identified the
visualized abdomen. Evaluation however see limited due to patient's
body habitus and soft tissue attenuation. The visualized lung bases
are clear.
IMPRESSION: Enteric tube likely in the proximal stomach.

## 2017-06-27 MED ORDER — DOXYCYCLINE HYCLATE 100 MG PO TABS: 100.0000 mg | ORAL_TABLET | Freq: Two times a day (BID) | ORAL | 0 refills | Status: AC

## 2017-06-27 MED ORDER — AMOXICILLIN-POT CLAVULANATE 875-125 MG PO TABS: 1.0000 | ORAL_TABLET | Freq: Two times a day (BID) | ORAL | 0 refills | Status: AC

## 2017-06-27 MED ORDER — NAPROXEN 375 MG PO TABS: 375.0000 mg | ORAL_TABLET | Freq: Two times a day (BID) | ORAL | 0 refills | Status: DC

## 2017-06-27 NOTE — Progress Notes (Signed)
Patient: Bryan Gallegos  DOB: [birth year] MRN: [id number] PCP: Precious Gilding, PA    Chief Complaint  Patient presents with  . Follow-up    Thoracic vertebral osteomyelitis FU   . Hepatitis C     Patient Active Problem List   Diagnosis Date Noted  . Swelling of right hand 06/27/2017  . Bipolar affective disorder ([place])   . Vertebral osteomyelitis ([place]) 05/01/2017  . MDD (major depressive disorder), recurrent severe, without psychosis ([place]) 02/09/2017  . Opioid use disorder ([place])   . Hepatitis C infection   . History of suicide attempt 10/17/2016  . Chronic pain syndrome 10/22/2015  . Polysubstance dependence including opioid type drug, episodic abuse ([place]) 09/04/2015  . Cholelithiasis 05/17/2015  . Antisocial personality disorder ([place]) 04/11/2015  . Benzodiazepine dependence ([place]) 04/11/2015  . Tobacco use disorder 04/10/2015  . Gastroesophageal reflux disease with esophagitis 01/28/2014  . Diabetes mellitus type II, controlled ([place]) 01/28/2014  . Morbid obesity ([place]) 01/28/2014  . TIA (transient ischemic attack) 09/02/2013  . HLD (hyperlipidemia) 08/06/2013  . OSA on CPAP 08/06/2013     Subjective:  Bryan Gallegos is a 42 y.o. is here today for follow up on his T11-12 osteomyelitis. He was hospitalized in early March with acute, severe back pain superimposed upon his chronic pain syndrome.  An MRI showed right paraspinous inflammation at the T11-12 level.  An aspirate was performed and Gram stain and culture were both negative. Blood cultures were negative. He was started on empiric Vancomycin + Ceftriaxone with plan for 8 weeks of therapy through 06/25/17. Mr. Carroll tells me that on this evening he was infusing his antibiotic and it got 'hung up' on something and the line pulled out completely. He did not put a bandage on this and did not notify his Home Health team of this happening. No breathing difficulty after this event. He has previously had issues with  pulling the PICC and had been seen in the ED twice for problems associated with this most recently 1 month prior. He at that time reported some chills and continued back pain and there was documentation of a fever during this time to 102 degrees. Blood cultures were negative.  He would like the PICC line replaced. Back pain otherwise managed by one provider with Oxycydone IR and clonazepam and ultram. He does not take other medications consistently and tells me he is worried about adherence with oral regimen.   Mr. Szymborski tells me that he was having some improvement in back pain however "since the IV antibiotics stopped the pain has gotten worse." Tells me he has "a whole bag of medications" left in his refrigerator. He has not had any further chills/fevers however does have some pain and swelling in his right hand - small red scab in the center of this where he tells me a cat scratched him recently. Later told our pharmacy team he previously injected at this site and the needle broke off.  Regarding his hepatitis C - he denies any symptoms or history of liver disease including yellowing of skin/eyes, dark urine, light stool, bleeding, bloody vomit or abdominal pain. Tells me his wife had Hepatitis C that she incurred after blood transfusions from a previous C-Section which is where he was exposed. Of note he also has been injecting drugs since her death 8 months ago to deal with some of the depression and frankly tried to use that as a vehicle for suicide. He is now in the care  of a psychiatry team. Last injected in January of this year.   Review of Systems  Constitutional: Negative for chills and fever.  HENT: Negative for tinnitus.   Eyes: Negative for blurred vision and photophobia.  Respiratory: Negative for cough and sputum production.   Cardiovascular: Negative for chest pain and leg swelling.  Gastrointestinal: Negative for abdominal pain, diarrhea, nausea and vomiting.  Genitourinary: Negative  for dysuria.  Musculoskeletal: Positive for back pain and joint pain.       Swollen right hand  Skin: Negative for rash.  Neurological: Negative for headaches.    Past Medical History:  Diagnosis Date  . Adult ADHD   . Anaphylactic reaction   . Anxiety   . Asthma   . Bipolar disorder ([place])   . Complication of anesthesia    Per patient difficult intubation;  . Diabetes mellitus   . Difficult intubation    Per patient  . Drug overdose   . Heart valve disorder    s/p echocardiogram  . Hyperlipidemia   . Hypertension   . Morbidly obese ([place])   . OSA (obstructive sleep apnea)   . Polysubstance abuse ([place])   . Transient cerebral ischemia    Unknown    Outpatient Medications Prior to Visit  Medication Sig Dispense Refill  . albuterol (PROVENTIL HFA;VENTOLIN HFA) 108 (90 Base) MCG/ACT inhaler Inhale 1-2 puffs into the lungs every 6 (six) hours as needed for wheezing or shortness of breath. 1 Inhaler 0  . atorvastatin (LIPITOR) 20 MG tablet Take 1 tablet (20 mg total) by mouth at bedtime. For high cholesterol  1  . clonazePAM (KLONOPIN) 1 MG tablet TAKE ONE TABLET BY MOUTH TWICE DAILY AS NEEDED panic attacks  3  . COMBIVENT RESPIMAT 20-100 MCG/ACT AERS respimat Inhale 1 puff into the lungs every 6 (six) hours as needed for wheezing or shortness of breath.  0  . gabapentin (NEURONTIN) 400 MG capsule Take 2 capsules (800 mg total) by mouth 3 (three) times daily. For agitation/Neuropathy 10 capsule 0  . hydrOXYzine (ATARAX/VISTARIL) 25 MG tablet Take 1 tablet (25 mg total) by mouth every 6 (six) hours as needed for anxiety. 60 tablet 0  . omeprazole (PRILOSEC) 40 MG capsule Take 1 capsule (40 mg total) by mouth 2 (two) times daily. For acid reflux 1 capsule 0  . oxyCODONE-acetaminophen (PERCOCET/ROXICET) 5-325 MG tablet Take 1 tablet by mouth 3 (three) times daily as needed.  0  . polyethylene glycol powder (MIRALAX) powder Take 17 g by mouth daily. For constipation 500 g 0  .  tiZANidine (ZANAFLEX) 4 MG capsule Take 4 mg by mouth 3 (three) times daily as needed for muscle spasms.  2  . traZODone (DESYREL) 100 MG tablet TAKE TWO TABLETS BY MOUTH 45 minutes BEFORE bed  0  . ursodiol (ACTIGALL) 300 MG capsule Take 1 capsule (300 mg total) by mouth 2 (two) times daily. For kidney stone    . ziprasidone (GEODON) 80 MG capsule TAKE ONE CAPSULE BY MOUTH EVERY evening WITH FOOD  0  . furosemide (LASIX) 40 MG tablet Take 1 tablet (40 mg total) by mouth daily. For swellings (Patient not taking: Reported on 05/01/2017) 1 tablet 0   No facility-administered medications prior to visit.      Allergies  Allergen Reactions  . Ace Inhibitors Anaphylaxis  . Atenolol Anaphylaxis  . Lisinopril Anaphylaxis  . Prednisone Hives  . Ibuprofen Itching, Swelling and Other (See Comments)    Hand/feet swelling   . Tylenol [  Acetaminophen] Other (See Comments)    Reaction:  GI bleeding     Social History   Tobacco Use  . Smoking status: Current Every Day Smoker    Packs/day: 0.50    Years: 23.00    Pack years: 11.50    Types: Cigarettes  . Smokeless tobacco: Never Used  Substance Use Topics  . Alcohol use: No    Alcohol/week: 0.0 oz  . Drug use: Not Currently    Types: Benzodiazepines, Heroin    Family History  Problem Relation Age of Onset  . CAD Mother        Living  . Diabetes Mellitus II Mother   . Stroke Mother   . Hypertension Mother   . Congestive Heart Failure Mother   . Kidney disease Mother   . Fibromyalgia Mother   . Thyroid disease Mother   . Hyperlipidemia Mother   . Liver disease Mother   . Alcoholism Father 36       Deceased  . Arthritis Maternal Grandmother   . Congestive Heart Failure Maternal Grandmother   . Hypertension Maternal Grandmother   . Lung cancer Maternal Grandfather   . Colon cancer Maternal Aunt   . Stomach cancer Maternal Aunt   . Heart disease Other        Paternal & Maternal  . Crohn's disease Other   . Hypertension Other         Paternal & Maternal  . Hypertension Brother        x3  . Hypertension Sister        #1  . Bipolar disorder Sister        #1  . ADD / ADHD Son        x3  . Bipolar disorder Son        x3  . Asperger's syndrome Son     Objective:   Vitals:   06/27/17 1034  BP: (!) 176/107  Pulse: 96  Temp: 98.5 F (36.9 C)  TempSrc: Oral  Weight: (!) 354 lb (160.6 kg)   Body mass index is 45.45 kg/m.  Physical Exam  Constitutional: He is oriented to person, place, and time. He appears well-developed.  Seated in chair. Mild discomfort. Appears slightly impaired.   HENT:  Head: Normocephalic.  Mouth/Throat: No oral lesions. Normal dentition. No dental caries.  Eyes: No scleral icterus.  2/2 mm pupils   Neck: Normal range of motion.  Cardiovascular: Normal rate, regular rhythm, normal heart sounds and intact distal pulses.  No murmur heard. Pulmonary/Chest: Effort normal and breath sounds normal. No respiratory distress.  Abdominal: Soft. He exhibits no shifting dullness, no distension and no ascites. There is no tenderness.  No hepatosplenomegaly detected however difficult d/t body habitus. No abdominal varicosities noted.   Musculoskeletal:  Right arm PICC insertion site with some yellow scabbed material noted. No surrounding erythema or swelling to the site. No bandage in place.  Posterior right hand with large swelling noted. Warm and somewhat tender.   Lymphadenopathy:    He has no cervical adenopathy.  Neurological: He is alert and oriented to person, place, and time.  Slight slurring to speech.   Skin: Skin is warm and dry. No rash noted.  Psychiatric: He has a normal mood and affect. His behavior is normal. Judgment normal.    Lab Results: Lab Results  Component Value Date   WBC 7.1 05/25/2017   HGB 12.3 (L) 05/25/2017   HCT 38.5 (L) 05/25/2017   MCV 91.4 05/25/2017  PLT 215 05/25/2017    Lab Results  Component Value Date   CREATININE 1.01 05/25/2017   BUN 12  05/25/2017   NA 136 05/25/2017   K 3.9 05/25/2017   CL 99 (L) 05/25/2017   CO2 29 05/25/2017    Lab Results  Component Value Date   ALT 26 05/25/2017   AST 28 05/25/2017   ALKPHOS 68 05/25/2017   BILITOT 0.4 05/25/2017     Assessment & Plan:   Problem List Items Addressed This Visit      Digestive   Hepatitis C infection - Primary    Genotype 1a. Treatment naive and VL over 8 million. Will check PT/INR and platelets today as well as fibrosure. Will arrange for liver ultrasound as well. He can begin treatment once we stage his liver disease and get insurance approval. Would like to limit pill burden with this and have him do either Epclusa once daily or Harvoni for 12 weeks. I worry greatly about his adherence and discussed that this is so important he takes it correctly to achieve cure and not create resistant virus. Will see how he does with his antibiotic adherence.       Relevant Orders   INR/PT   Comprehensive metabolic panel   Liver Fibrosis, FibroTest-ActiTest   US ABDOMEN COMPLETE W/ELASTOGRAPHY     Musculoskeletal and Integument   Vertebral osteomyelitis (HCC)    Ongoing back pain after some notable improvement with some IV treatment. Considering he has a large stockpile of medication from his report it is apparent he did not take his IV medications well. In looking at the vancomycin trough levels also they have generally been low. MRI a month ago does not show any change to the paraspinal inflammation around this area; also no mention of abscess although study limited by movement. I do not feel comfortable replacing his PICC line at this time considering his previous concerns pulling it out, poor adherence with IV mediations and fever in the setting of PICC previously (which was not information he provided our team with). I would like to place him on a bioavailable oral regimen however he is on too many antipsychotics to consider fluoroquinolone safely regarding QTc  prolongation. I will start him on Doxycycline and Augmentin BID x 4 weeks. I asked him to take this with food. Will check a CRP/ESR today to see if we can use this to guide therapy. I worry his compliance will continue to sub-optimal. He will follow up with Marya Amsler or Dr. Megan Salon in 4 weeks for further assessment.       Relevant Orders   Sedimentation rate   C-reactive protein   CBC     Other   Opioid use disorder ([place])    Currently has not used injection drugs (cocaine) since January however has continued with ongoing heavy opioid and benzodiazapine. [place] website assessed and he is taking large combinations of oxycodone and benzodiazapine's. I asked him if he has access to narcan and he reportedly does.       Swelling of right hand    Check X-ray of hand to assess for foreign body. Will refer out to hand surgeon for consideration of intervention to remove/debride this site should it be necessary. Likely will need further imaging. Instructed that he may need to go to ED for evaluation should he have worsened swelling causing any compromise to his strength/sensation.       Relevant Orders   DG Hand 2 View Right   Ambulatory  referral to [place], MSN, NP-C [facility] Pager: [phone] Office: [phone]  06/27/17  12:59 PM
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
## 2017-06-27 NOTE — ED Notes (Signed)
Pt upstairs visiting family member. Pt will be placed back in waiting room.

## 2017-06-27 NOTE — ED Notes (Signed)
Called to room, N/A x 1.
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
## 2017-06-27 NOTE — Assessment & Plan Note (Addendum)
Ongoing back pain after some notable improvement with some IV treatment. Considering he has a large stockpile of medication from his report it is apparent he did not take his IV medications well. In looking at the vancomycin trough levels also they have generally been low. MRI a month ago does not show any change to the paraspinal inflammation around this area; also no mention of abscess although study limited by movement. I do not feel comfortable replacing his PICC line at this time considering his previous concerns pulling it out, poor adherence with IV mediations and fever in the setting of PICC previously (which was not information he provided our team with). I would like to place him on a bioavailable oral regimen however he is on too many antipsychotics to consider fluoroquinolone safely regarding QTc prolongation. I will start him on Doxycycline and Augmentin BID x 4 weeks. I asked him to take this with food. Will check a CRP/ESR today to see if we can use this to guide therapy. I worry his compliance will continue to sub-optimal. He will follow up with Marya Amsler or Dr. Megan Salon in 4 weeks for further assessment.
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
## 2017-06-27 NOTE — Assessment & Plan Note (Signed)
Check X-ray of hand to assess for foreign body. Will refer out to hand surgeon for consideration of intervention to remove/debride this site should it be necessary. Likely will need further imaging. Instructed that he may need to go to ED for evaluation should he have worsened swelling causing any compromise to his strength/sensation.

## 2017-06-27 NOTE — Telephone Encounter (Signed)
Noted. Orders were placed to ensure PICC removal at completion of treatment which was scheduled for 4/27. Patient did not show for his scheduled follow up.

## 2017-06-27 NOTE — Progress Notes (Signed)
HPI: Bryan Gallegos is a 42 y.o. male here to see Judeth Cornfield today for vertebral osteomyelitis follow up. He was found to have hepatitis C in 12/2016 and would like to start treatment.  Lab Results  Component Value Date   HEPCGENOTYPE 1a 01/14/2017    Allergies: Allergies  Allergen Reactions  . Ace Inhibitors Anaphylaxis  . Atenolol Anaphylaxis  . Lisinopril Anaphylaxis  . Prednisone Hives  . Ibuprofen Itching, Swelling and Other (See Comments)    Hand/feet swelling   . Tylenol [Acetaminophen] Other (See Comments)    Reaction:  GI bleeding     Vitals: Temp: 98.5 F (36.9 C) (04/29 1034) Temp Source: Oral (04/29 1034) BP: 176/107 (04/29 1034) Pulse Rate: 96 (04/29 1034)  Past Medical History: Past Medical History:  Diagnosis Date  . Adult ADHD   . Anaphylactic reaction   . Anxiety   . Asthma   . Bipolar disorder (HCC)   . Complication of anesthesia    Per patient difficult intubation;  . Diabetes mellitus   . Difficult intubation    Per patient  . Drug overdose   . Heart valve disorder    s/p echocardiogram  . Hyperlipidemia   . Hypertension   . Morbidly obese (HCC)   . OSA (obstructive sleep apnea)   . Polysubstance abuse (HCC)   . Transient cerebral ischemia    Unknown    Social History: Social History   Socioeconomic History  . Marital status: Single    Spouse name: Not on file  . Number of children: Not on file  . Years of education: Not on file  . Highest education level: Not on file  Occupational History  . Not on file  Social Needs  . Financial resource strain: Not on file  . Food insecurity:    Worry: Not on file    Inability: Not on file  . Transportation needs:    Medical: Not on file    Non-medical: Not on file  Tobacco Use  . Smoking status: Current Every Day Smoker    Packs/day: 0.50    Years: 23.00    Pack years: 11.50    Types: Cigarettes  . Smokeless tobacco: Never Used  Substance and Sexual Activity  . Alcohol use: No     Alcohol/week: 0.0 oz  . Drug use: Not Currently    Types: Benzodiazepines, Heroin  . Sexual activity: Not on file  Lifestyle  . Physical activity:    Days per week: Not on file    Minutes per session: Not on file  . Stress: Not on file  Relationships  . Social connections:    Talks on phone: Not on file    Gets together: Not on file    Attends religious service: Not on file    Active member of club or organization: Not on file    Attends meetings of clubs or organizations: Not on file    Relationship status: Not on file  Other Topics Concern  . Not on file  Social History Narrative  . Not on file    Labs: Hepatitis B Surface Ag (no units)  Date Value  01/12/2017 Negative   HCV Ab (s/co ratio)  Date Value  01/12/2017 >11.0 (H)    Lab Results  Component Value Date   HEPCGENOTYPE 1a 01/14/2017    Hepatitis C RNA quantitative Latest Ref Rng & Units 01/13/2017  HCV Quantitative >50 IU/mL 3,290,000  HCV Quantitative Log >1.70 log10 IU/mL 6.517    AST (  U/L)  Date Value  05/25/2017 28  05/17/2017 31  04/30/2017 58 (H)   ALT (U/L)  Date Value  05/25/2017 26  05/17/2017 59  04/30/2017 77 (H)   INR (no units)  Date Value  05/01/2017 1.04  11/11/2016 1.01  04/08/2015 1.07    CrCl: CrCl cannot be calculated (Patient's most recent lab result is older than the maximum 21 days allowed.).  Fibrosis Score: Pending ARFI  Child-Pugh Score: 6 Class A  Previous Treatment Regimen: naive  Assessment: Bryan Gallegos is here today to see Stephane for vertebral infection follow up. He should have completed IV treatment 4/27 and states his PICC line fell out about 36 hours ago. He also has untreated hepatitis C (found 12/2016). He has a history of IV drug abuse (cocaine) and is adamant that his last use was 03/08/2017. He states he only started using in the last year after his wife died. His wife also had untreated hepatitis C before she died - Bryan Gallegos says she was  infected from a blood transfusion after childbirth. It is unclear when he contracted hepatitis C given multiple risk factors.   He will meet with Morrie Sheldon to schedule an ultrasound w/ elastography today. We discussed the cure rates with current oral options for treatment and he was counseled on the importance of adherence. He has omeprazole and atorvastatin on his medication list, which will interact with most DAA. He denies history of heart attack or stroke, but has been hospitalized for stroke symptoms (stroke was ruled out after full workup). Of note his last triglycerides were elevated in 2017, but we found he was hospitalized on propofol at that time. He also disclosed that he does not take the atorvastatin like he should and only takes the omeprazole as needed, so it is reasonable to hold these once he starts Hep C treatment. Of note he has Medicare and secondary coverage with Medicaid - Kathie Rhodes will look into DAA coverage.  Recommendations: ARFI scheduled for 06/30/17 at 10:30 am F/u with pharmacy clinic for hep C treatment initiation pending ARFI results   Kathyrn Sheriff, PharmD PGY1 Pharmacy Resident [facility] 06/27/17 11:43 AM

## 2017-06-27 NOTE — Patient Instructions (Addendum)
We will start you on some medications by mouth for the next 4 weeks -  Augmentin (twice a day) and Doxycycline (twice a day with food). Please call if you experience watery diarrhea more than 3 times a day or if you have any worsened headaches or mood swings.   For your hepatitis c treatment we need more information about your infection and how your liver is currently doing. We need to get you set up for an ultrasound.   Please return in 4 weeks to see Dr. Orvan Falconer or Tammy Sours for your back infection and to start your hepatitis c treatment.

## 2017-06-27 NOTE — Assessment & Plan Note (Addendum)
Currently has not used injection drugs (cocaine) since January however has continued with ongoing heavy opioid and benzodiazapine. NCCRS website assessed and he is taking large combinations of oxycodone and benzodiazapine's. I asked him if he has access to narcan and he reportedly does.

## 2017-06-27 NOTE — Assessment & Plan Note (Signed)
Genotype 1a. Treatment naive and VL over 8 million. Will check PT/INR and platelets today as well as fibrosure. Will arrange for liver ultrasound as well. He can begin treatment once we stage his liver disease and get insurance approval. Would like to limit pill burden with this and have him do either Epclusa once daily or Harvoni for 12 weeks. I worry greatly about his adherence and discussed that this is so important he takes it correctly to achieve cure and not create resistant virus. Will see how he does with his antibiotic adherence.

## 2017-06-28 LAB — PROTIME-INR
INR: 1
PROTHROMBIN TIME: 10.4 s (ref 9.0–11.5)

## 2017-06-28 LAB — C-REACTIVE PROTEIN: CRP: 14.1 mg/L — ABNORMAL HIGH (ref <8.0)

## 2017-06-28 IMAGING — CR DG ABD PORTABLE 1V
1 series · 1 of 1 positions shown · non-contrast
Comparison: Portable exam 1823 hours compared to 09/28/2015

CLINICAL DATA: Orogastric tube placement

EXAM:
PORTABLE ABDOMEN - 1 VIEW

[AP]
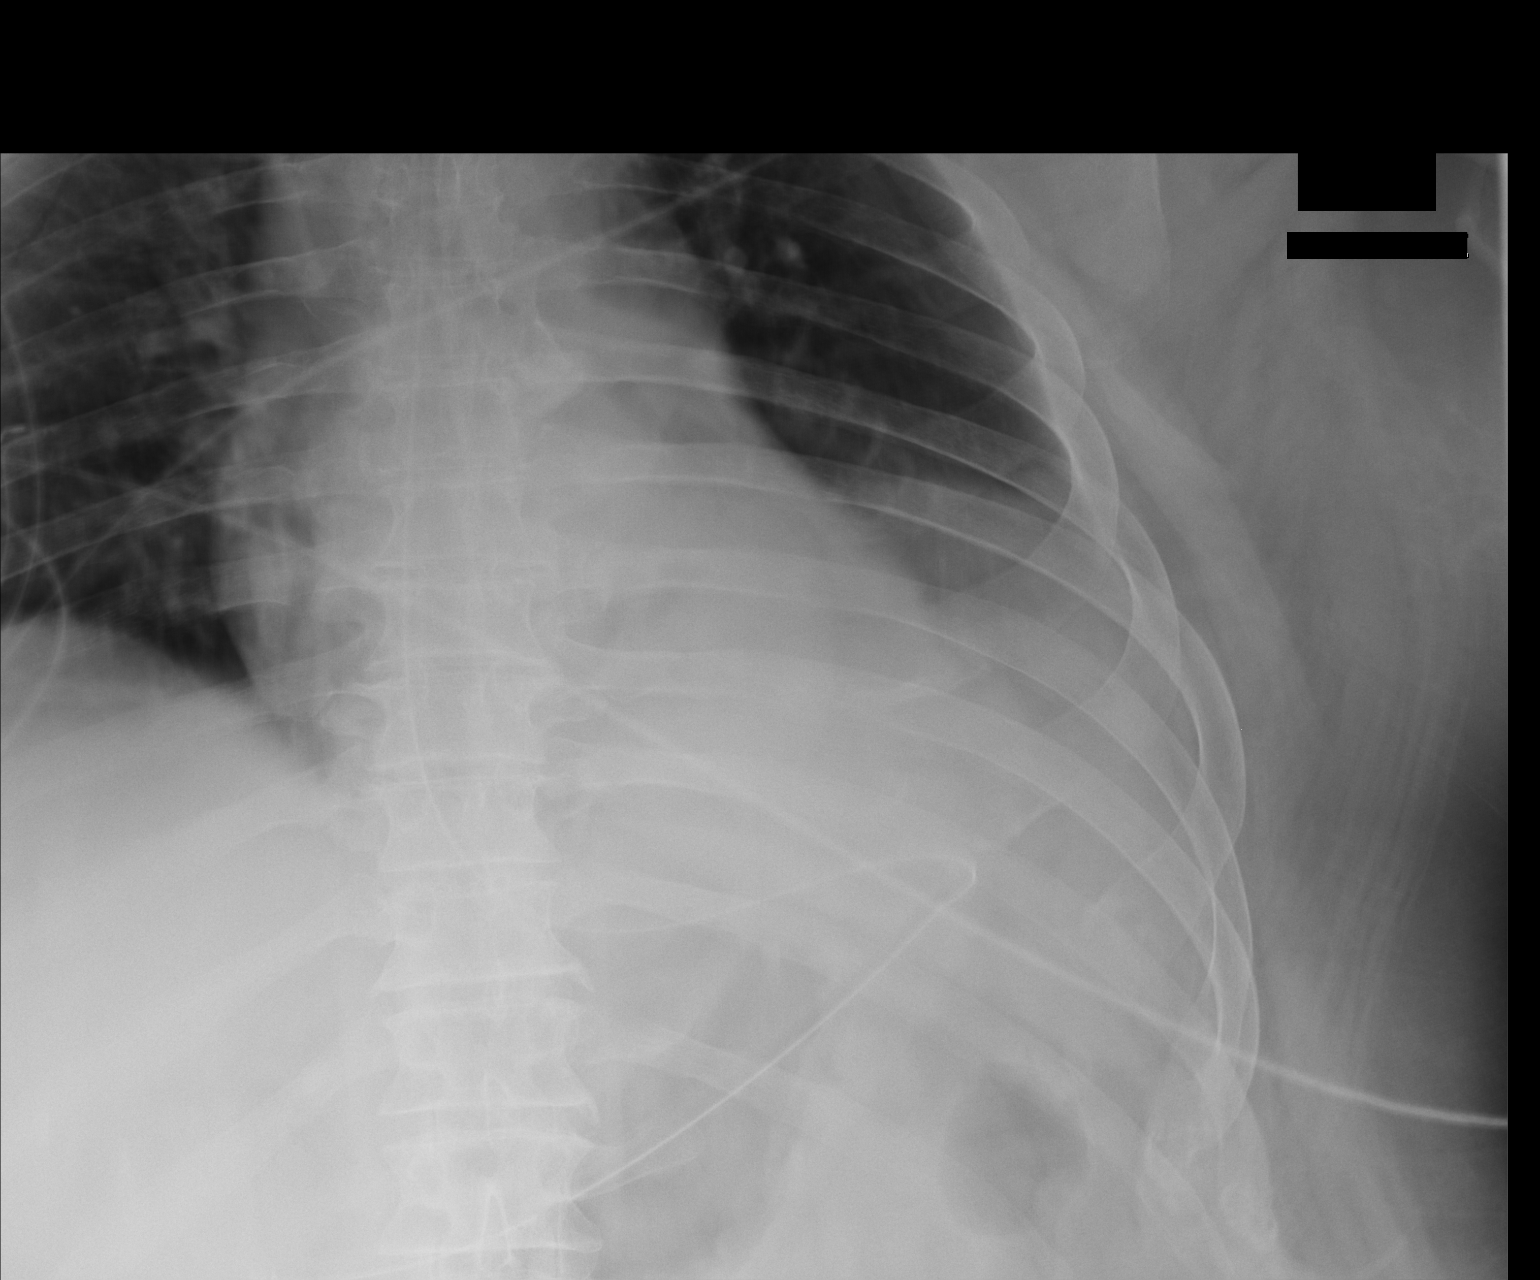

[1 of 1 positions shown; findings below may reference images not displayed]

FINDINGS: Tip of nasogastric tube projects over gastric antrum.

Atelectasis versus consolidation LEFT lower lobe with probable small
LEFT pleural effusion.
IMPRESSION: Tip of nasogastric tube projects over gastric antrum.

Atelectasis versus consolidation LEFT lower lobe with probable
associated LEFT pleural effusion.

## 2017-06-28 IMAGING — CR DG CHEST 1V PORT
1 series · 1 of 1 positions shown · non-contrast
Comparison: September 28, 2015

CLINICAL DATA: Respiratory failure

EXAM:
PORTABLE CHEST 1 VIEW

[AP]
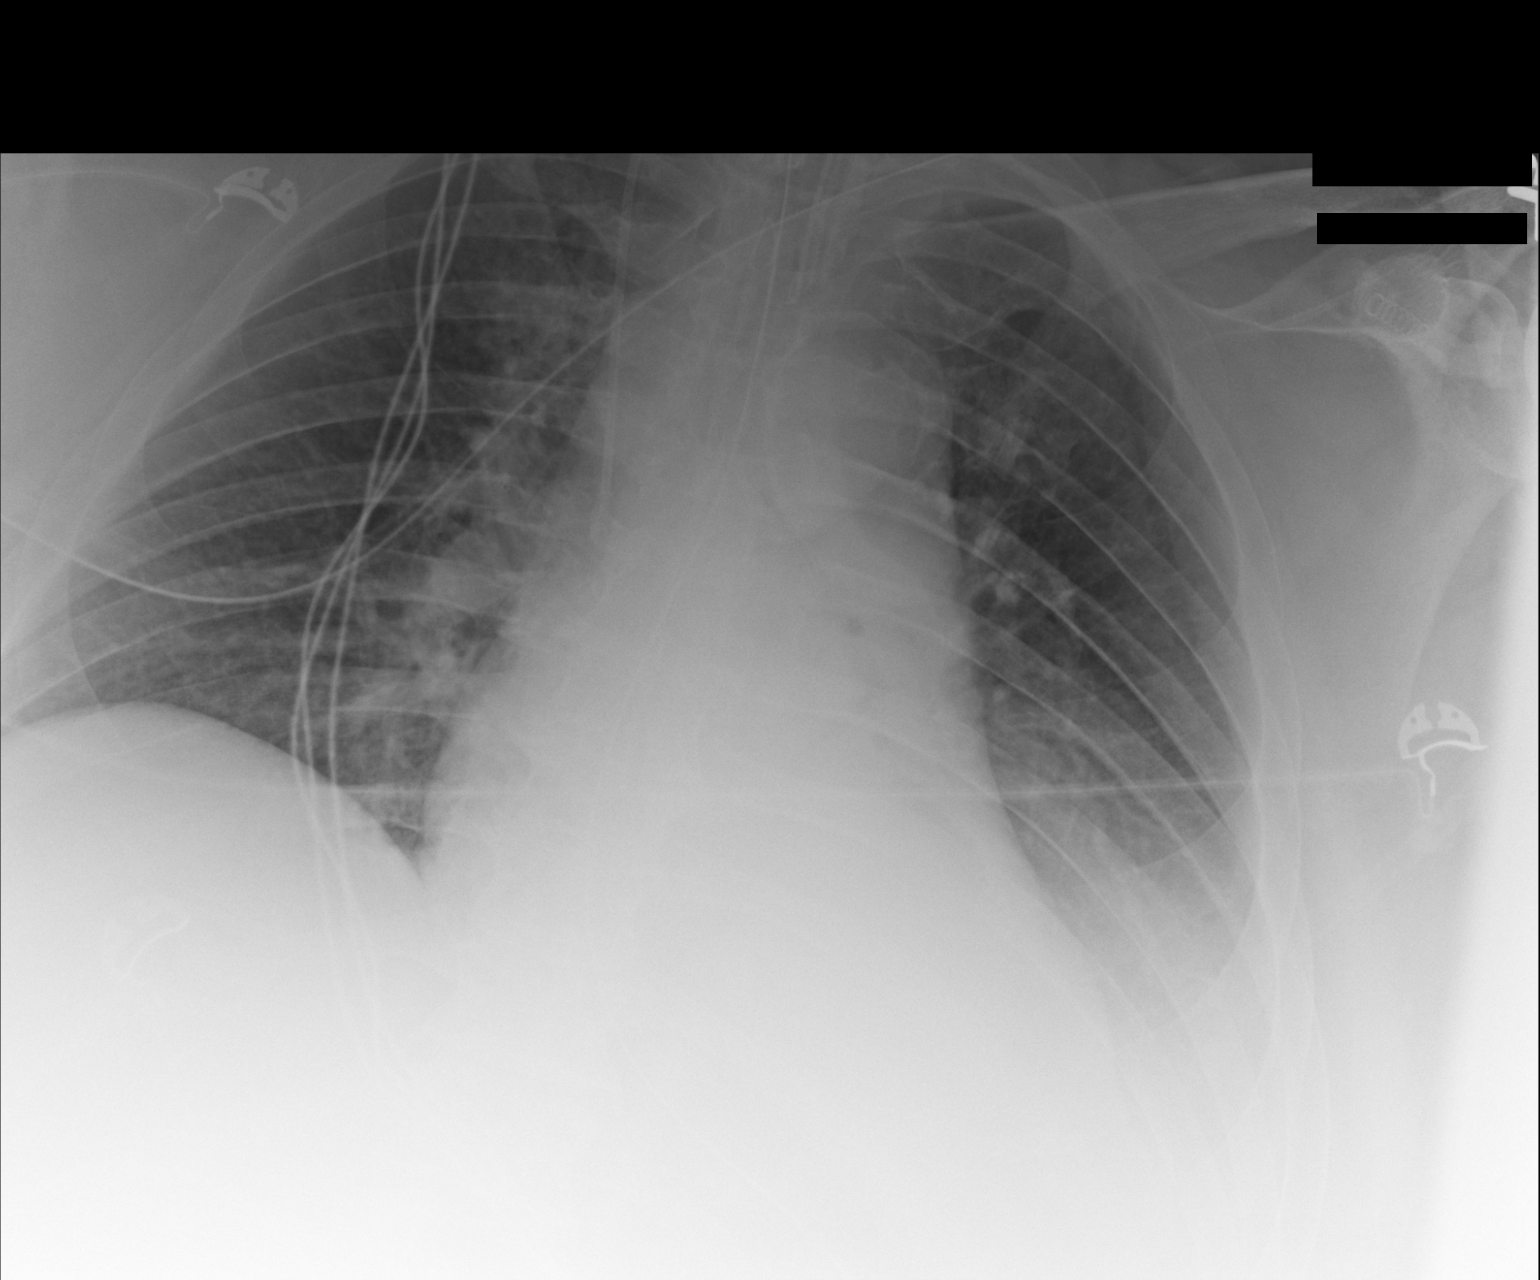

[1 of 1 positions shown; findings below may reference images not displayed]

FINDINGS: Stable support apparatus including the ETT. Haziness over the left
base suggest layering effusion and underlying opacity, likely
atelectasis. No overt pulmonary edema. The cardiomediastinal
silhouette is stable.
IMPRESSION: 1. Haziness over the left base consistent with layering effusion and
underlying atelectasis. Stable support apparatus.

## 2017-06-29 ENCOUNTER — Telehealth: Payer: Self-pay | Admitting: Behavioral Health

## 2017-06-29 LAB — LIVER FIBROSIS, FIBROTEST-ACTITEST
ALPHA-2-MACROGLOBULIN: 388 mg/dL — AB (ref 106–279)
ALT: 55 U/L — ABNORMAL HIGH (ref 9–46)
Apolipoprotein A1: 86 mg/dL — ABNORMAL LOW (ref 94–176)
Bilirubin: 0.2 mg/dL (ref 0.2–1.2)
FIBROSIS SCORE: 0.41
GGT: 42 U/L (ref 3–95)
Haptoglobin: 251 mg/dL — ABNORMAL HIGH (ref 43–212)
Necroinflammat ACT Score: 0.36
Reference ID: 2445359

## 2017-06-29 IMAGING — CR DG CHEST 1V PORT
2 series · 2 of 2 positions shown · non-contrast
Comparison: 09/29/2015.

CLINICAL DATA: Shortness of breath.

EXAM:
PORTABLE CHEST 1 VIEW

[AP (1 of 2)]
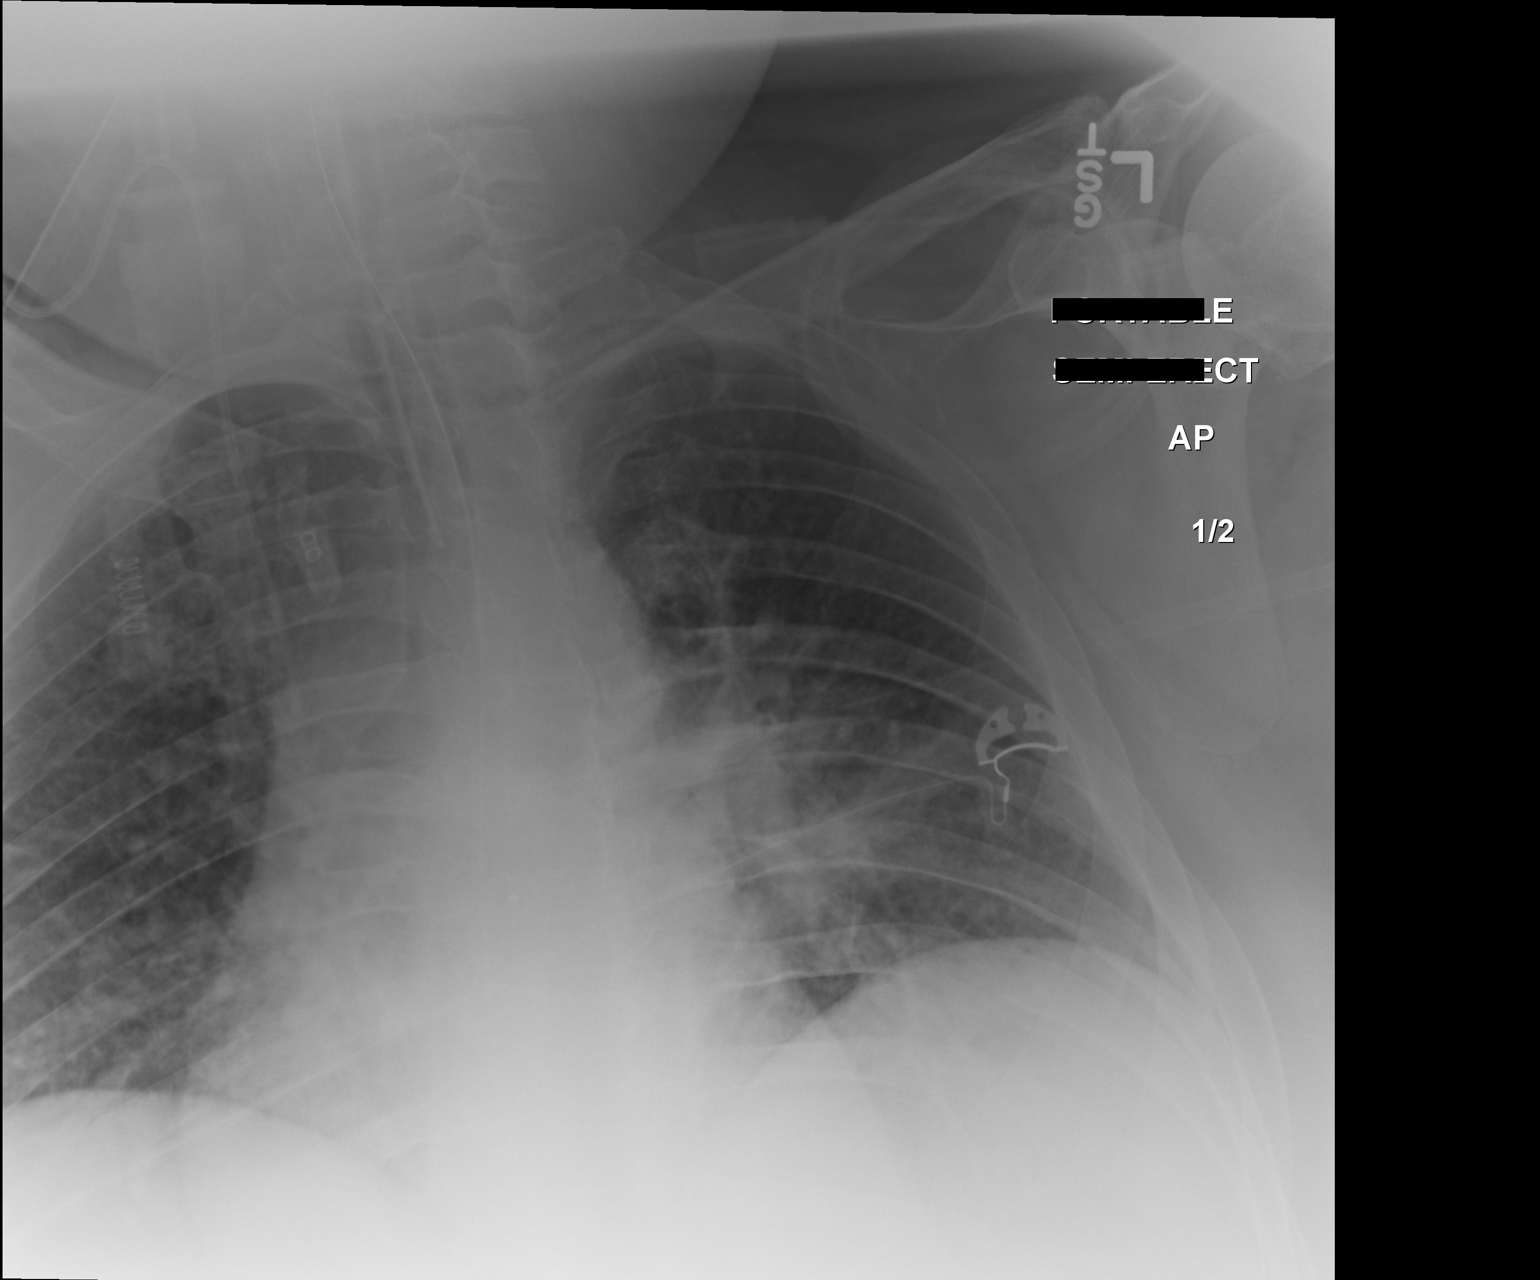

[AP (2 of 2)]
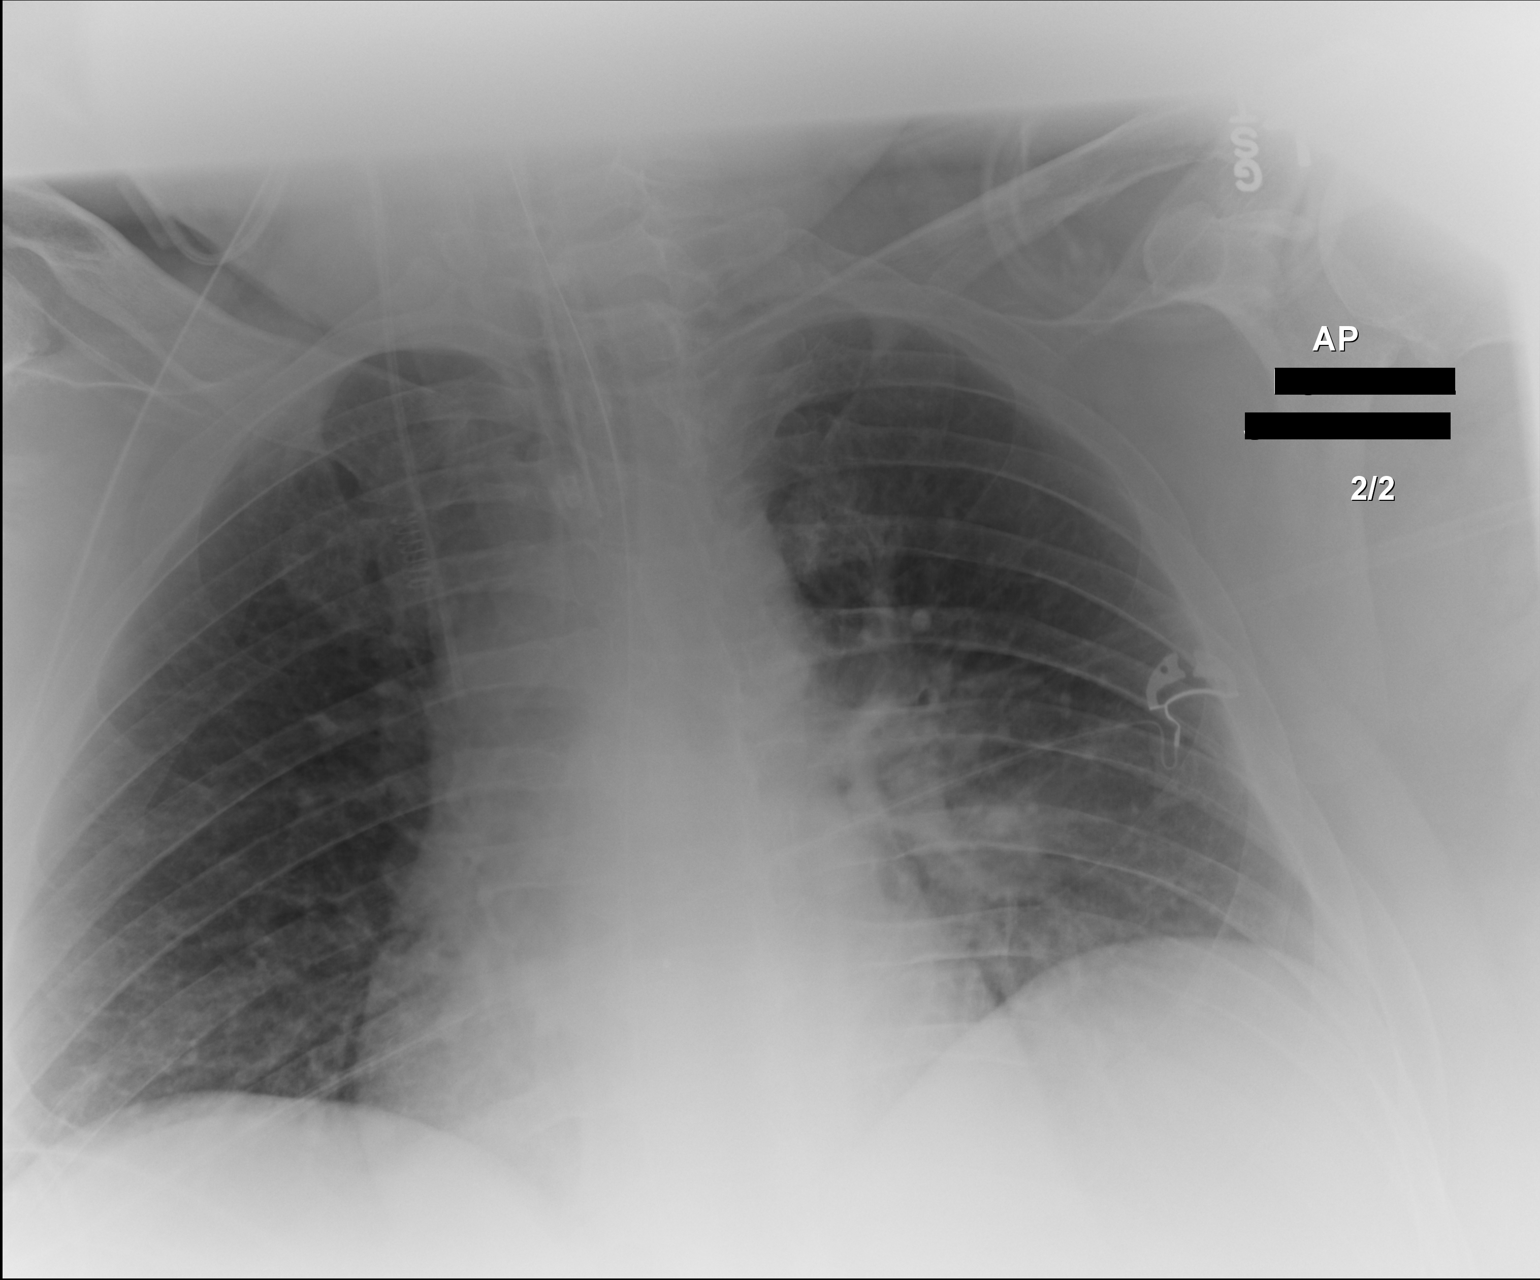

[2 of 2 positions shown; findings below may reference images not displayed]

FINDINGS: Support tubes and lines appear stable. ET tube remains 4 cm above
the carina in satisfactory position. Low lung volumes consistent
with atelectasis. Hazy density noted yesterday at the LEFT lung base
appears improved, with no evidence for effusion. Patchy opacity at
the RIGHT lung base could represent edema or infiltrate.
IMPRESSION: Slight worsening aeration at the RIGHT lung base. LEFT effusion
appears improved. Support tubes and lines stable.

## 2017-06-29 NOTE — Telephone Encounter (Signed)
Beverly from Dr. Ronie Spies office called and patient has an appointment with Dr. Mina Marble Tuesday May 7th at 1:30pm [facility] RN

## 2017-06-29 NOTE — Telephone Encounter (Signed)
Thank you for the update!

## 2017-06-30 ENCOUNTER — Ambulatory Visit (HOSPITAL_COMMUNITY): Payer: Medicare Other

## 2017-07-03 IMAGING — CT CT NECK W/ CM
4 series · 15 of 33 positions shown, 18 images · IV contrast (Omni 300)
Comparison: CT head without contrast 08/21/2015.

CLINICAL DATA: Throat tightness. Midline sternal pain. Recent ER
visit for allergic reaction.

EXAM:
CT NECK WITH CONTRAST
TECHNIQUE: Multidetector CT imaging of the neck was performed using the
standard protocol following the bolus administration of intravenous
contrast.
CONTRAST:  75mL 4SPN4B-PII IOPAMIDOL (4SPN4B-PII) INJECTION 61%

[Series 4: neck 2.0 st · axial · 0.67mm/px · z∈[-240,-30]mm · 6 of 147 slices shown, 8 images (1 of 3)]
[im 21/147  soft-tissue]
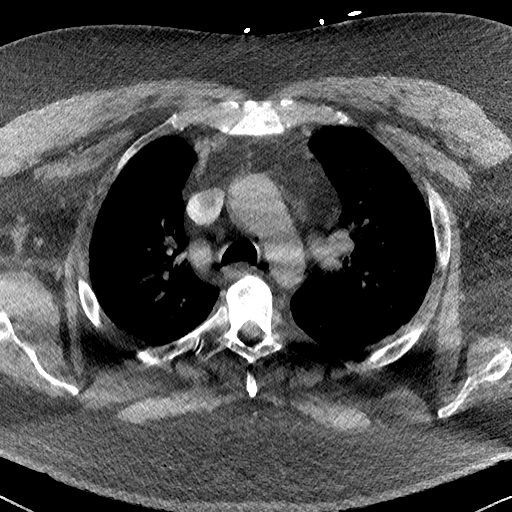
[im 21/147  bone]
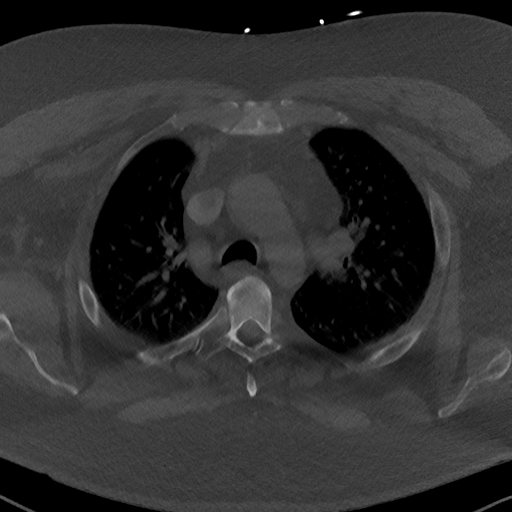
[im 42/147  bone]
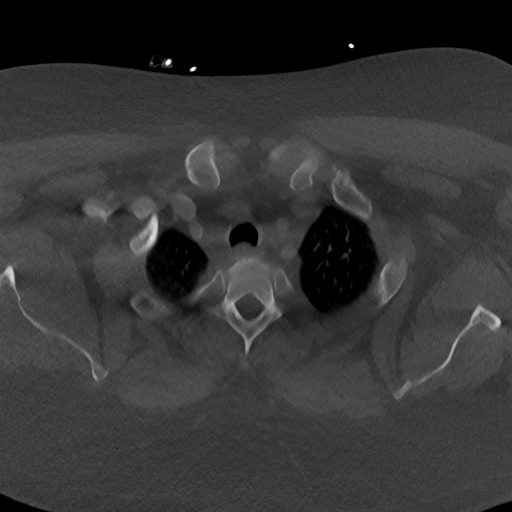
[im 63/147  bone]
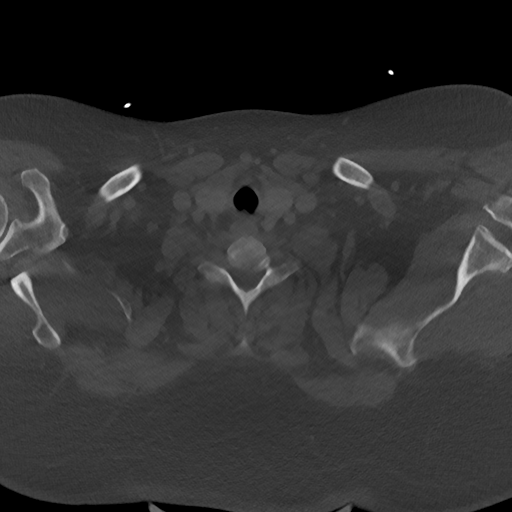
[im 84/147  bone]
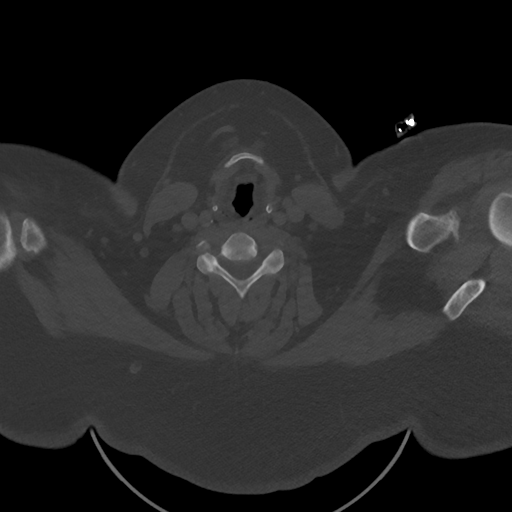
[im 105/147  soft-tissue]
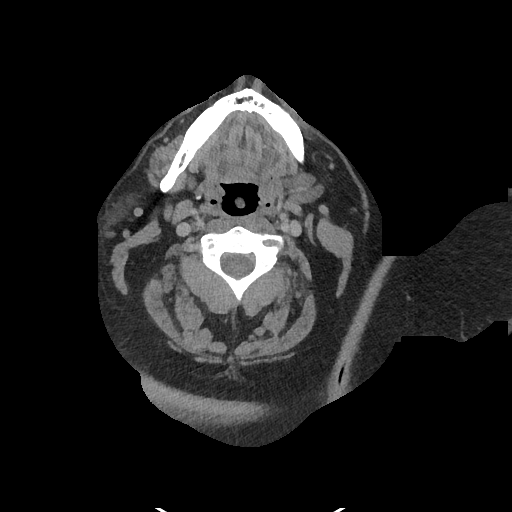
[im 105/147  bone]
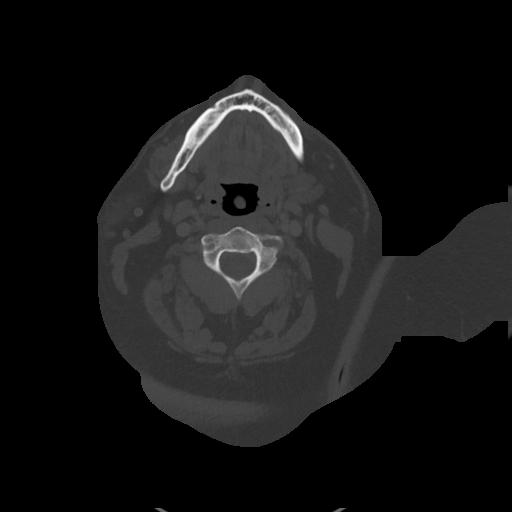
[im 126/147  bone]
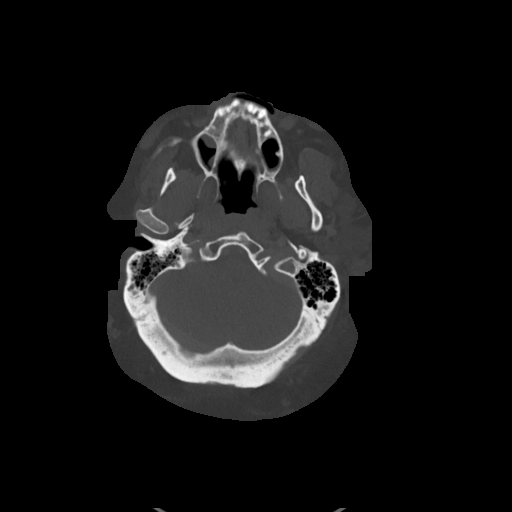

[Series 6: neck 2.0 st · sagittal · 0.61mm/px · 5 of 101 slices shown, 6 images (2 of 3)]
[im 34/101  bone]
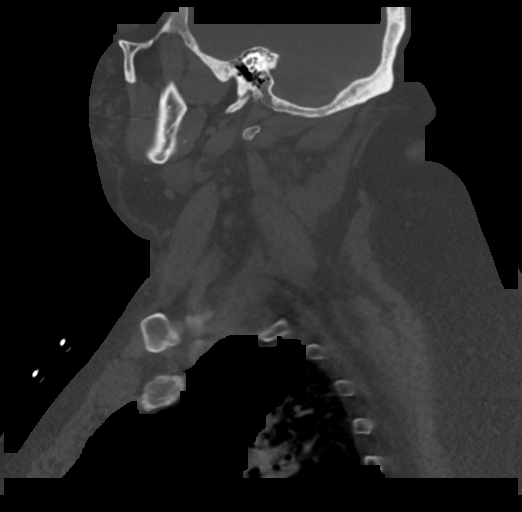
[im 42/101  bone]
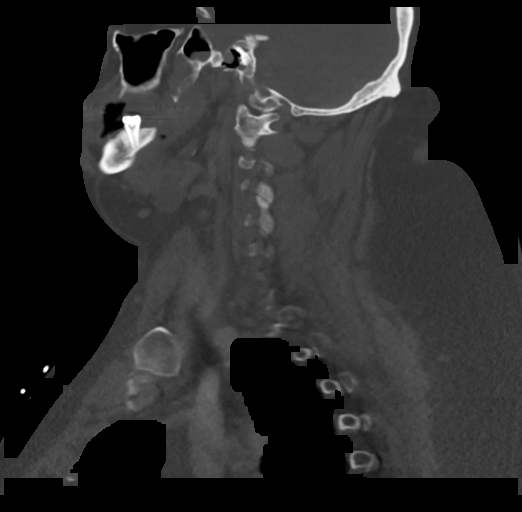
[im 51/101  soft-tissue]
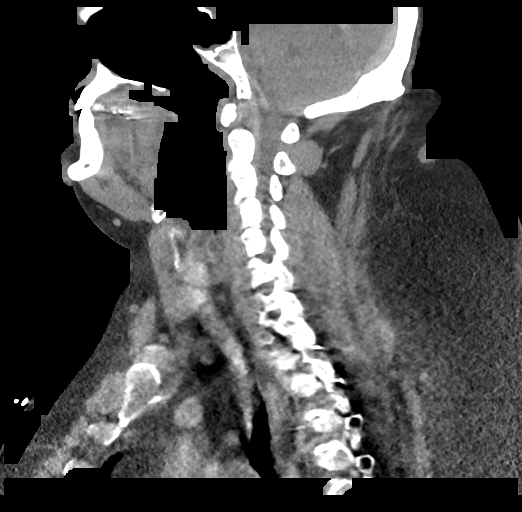
[im 51/101  bone]
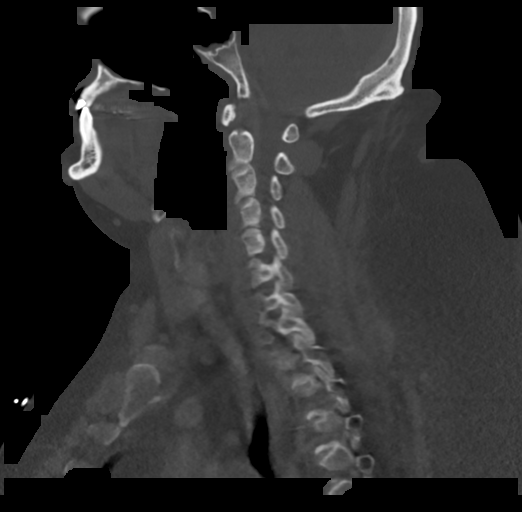
[im 59/101  bone]
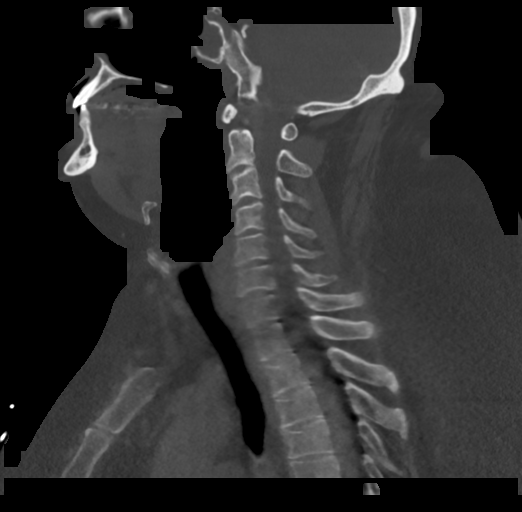
[im 67/101  bone]
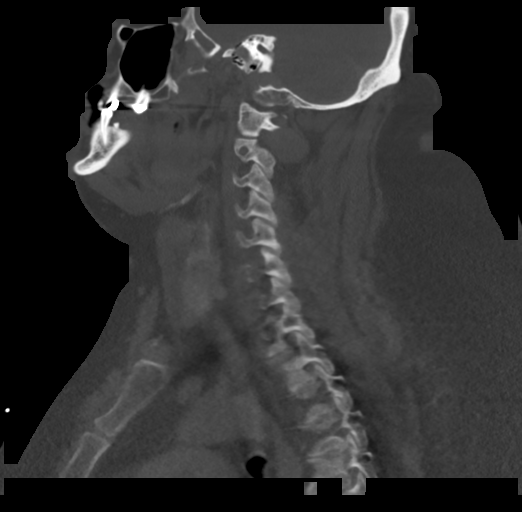

[Series 7: neck 2.0 st · coronal · 0.54mm/px · 3 of 101 slices shown (3 of 3)]
[im 21/101  bone]
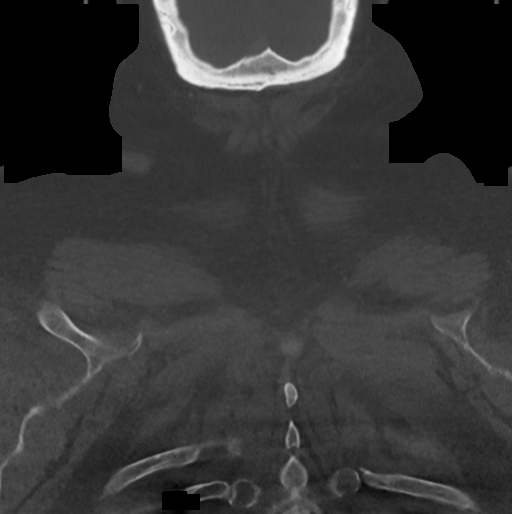
[im 41/101  bone]
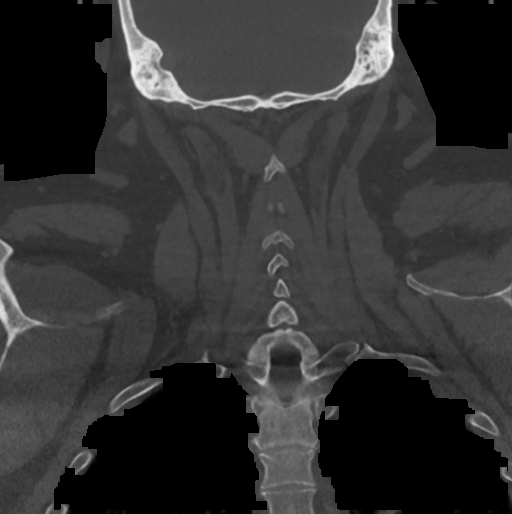
[im 61/101  bone]
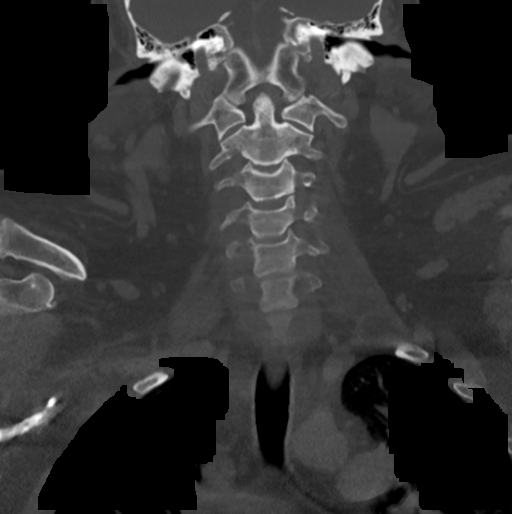

[Series 8: neck 2.0 st orthogonal · axial · 0.40mm/px · 1 of 127 slices shown]
[im 26/127  bone]
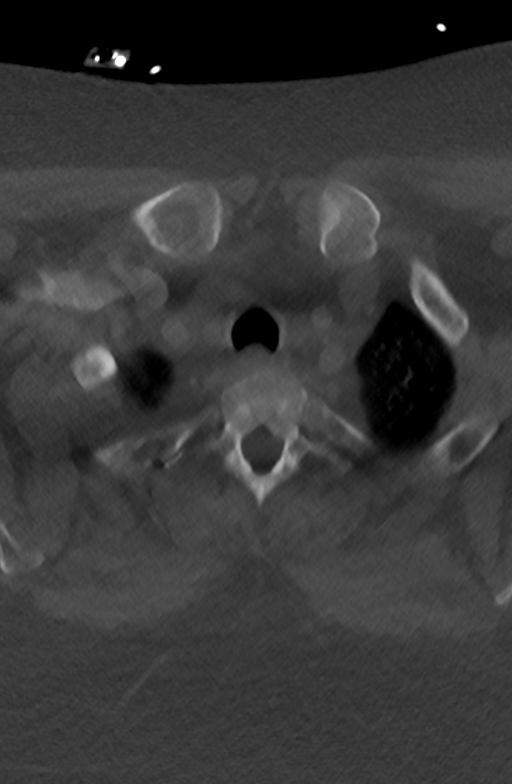

[15 of 33 positions shown; findings below may reference images not displayed]

FINDINGS: Pharynx and larynx: No focal mucosal or submucosal lesions are
present. The nasopharynx, oropharynx, and hypopharynx are clear.
Vocal cords are midline and symmetric.

Salivary glands: The submandibular and parotid glands are within
normal limits bilaterally.

Thyroid: Unremarkable

Lymph nodes: No significant cervical adenopathy is present.
Prominent jugulodigastric lymph nodes bilaterally are within normal
limits.

Vascular: No focal vascular lesions are present.

Limited intracranial: Within normal limits.

Visualized orbits: Within normal limits

Mastoids and visualized paranasal sinuses: The left sphenoid sinus
is opacified. There is moderate mucosal thickening throughout the
right sphenoid sinus. The visualized anterior paranasal sinuses are
clear.

Skeleton: No focal lytic or blastic lesions are present. Vertebral
body heights and alignment are within normal limits.

Upper chest: The lung apices are clear.
IMPRESSION: 1. No acute or focal lesion to explain the patient's symptoms.
2. Negative CT of the neck.

## 2017-07-07 IMAGING — CT CT ANGIO CHEST
2 of 6 series · 19 of 36 positions shown · IV contrast (ISOVUE 370)
Comparison: None.

CLINICAL DATA: Difficulty breathing

EXAM:
CT ANGIOGRAPHY CHEST WITH CONTRAST
TECHNIQUE: Multidetector CT imaging of the chest was performed using the
standard protocol during bolus administration of intravenous
contrast. Multiplanar CT image reconstructions and MIPs were
obtained to evaluate the vascular anatomy.
CONTRAST:  100 mL Isovue 370

[Series 6: thins for pacs · axial · 0.82mm/px · z∈[-321,-87]mm · 18 of 262 slices shown]
[im 14/262  lung]
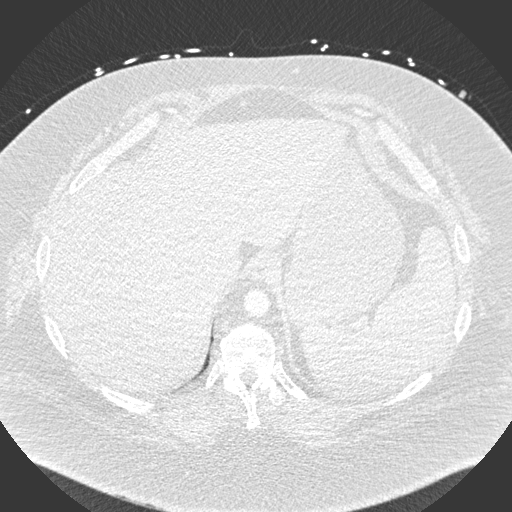
[im 27/262  mediastinal]
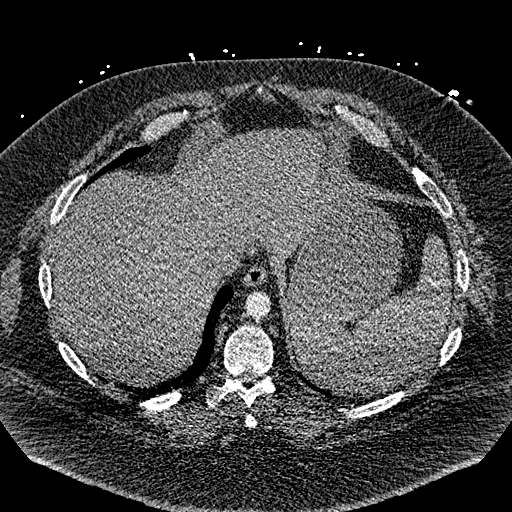
[im 40/262  lung]
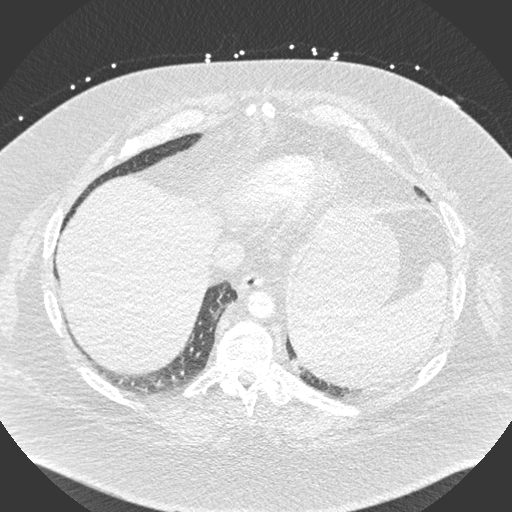
[im 53/262  mediastinal]
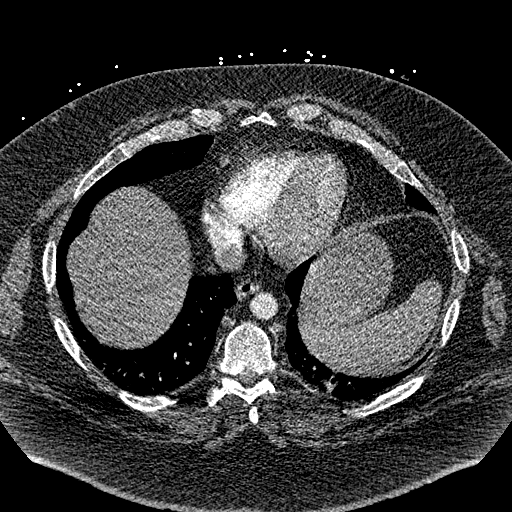
[im 66/262  lung]
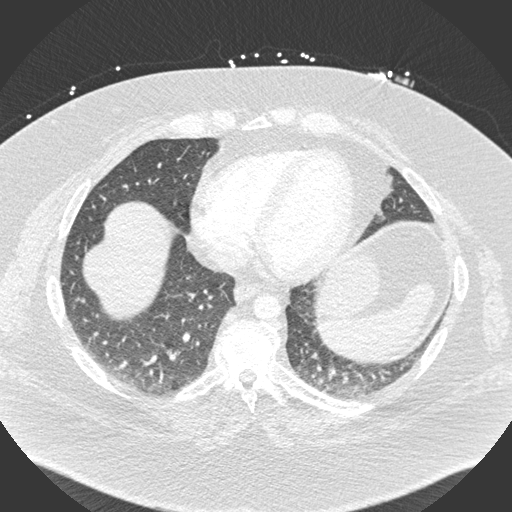
[im 79/262  mediastinal]
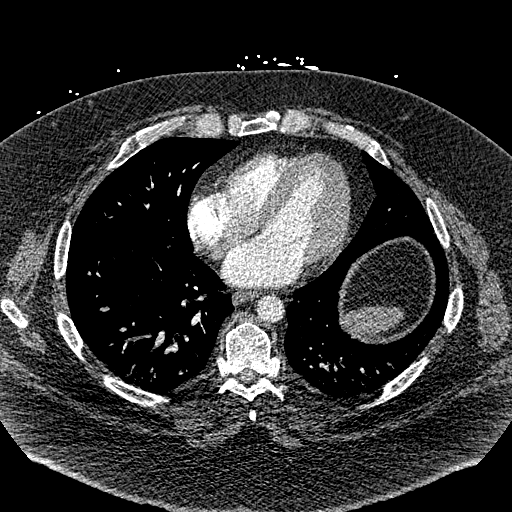
[im 92/262  lung]
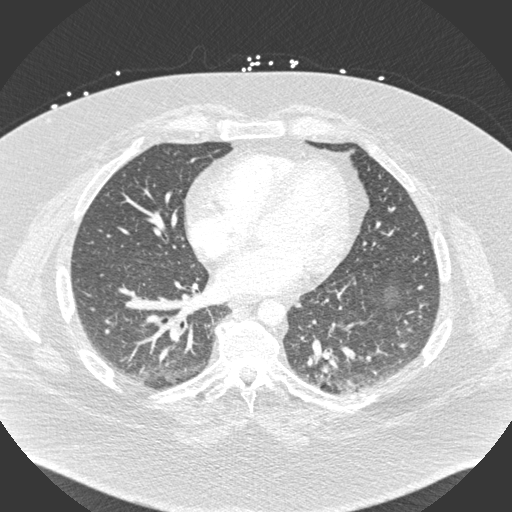
[im 105/262  mediastinal]
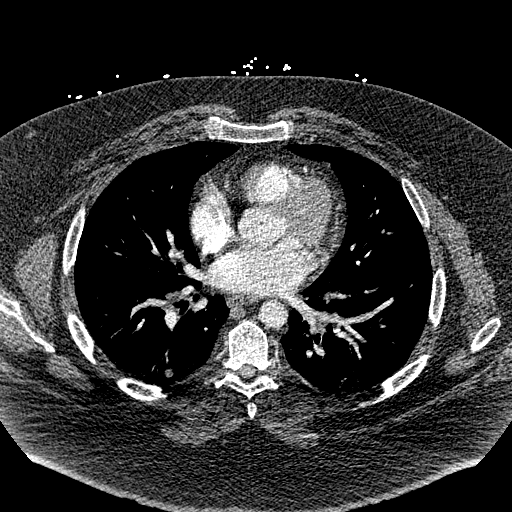
[im 118/262  lung]
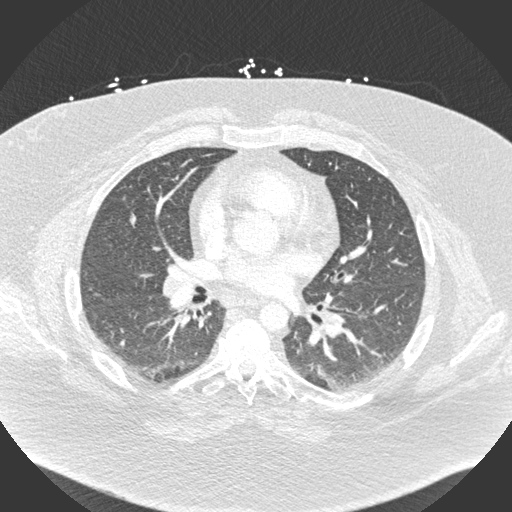
[im 144/262  mediastinal]
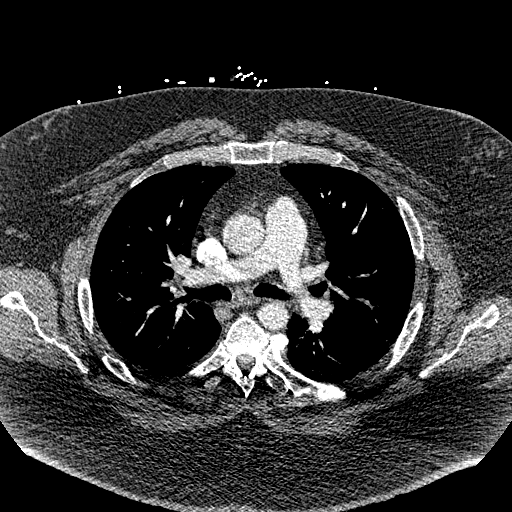
[im 157/262  lung]
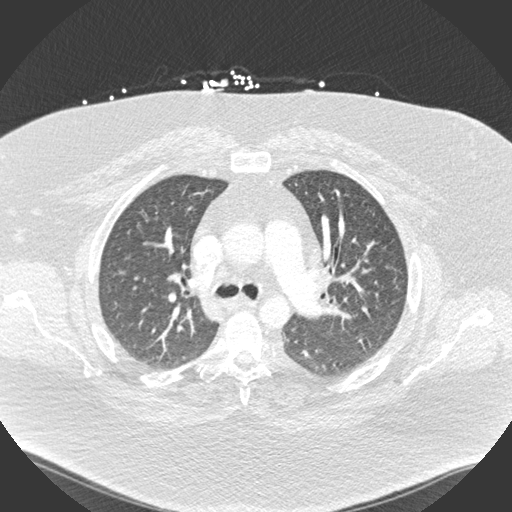
[im 170/262  mediastinal]
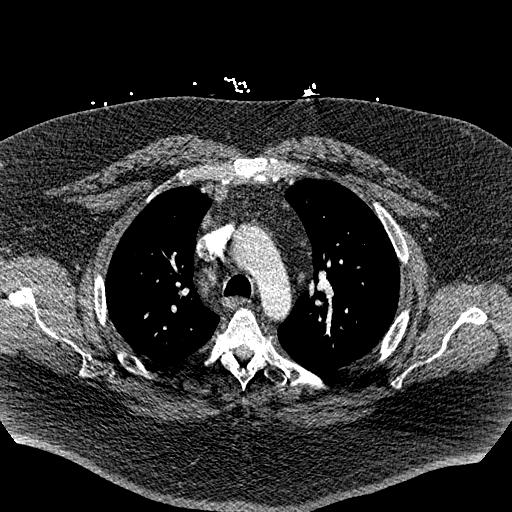
[im 183/262  lung]
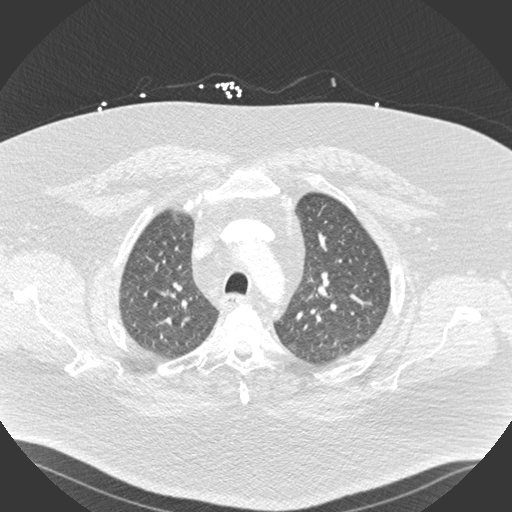
[im 196/262  mediastinal]
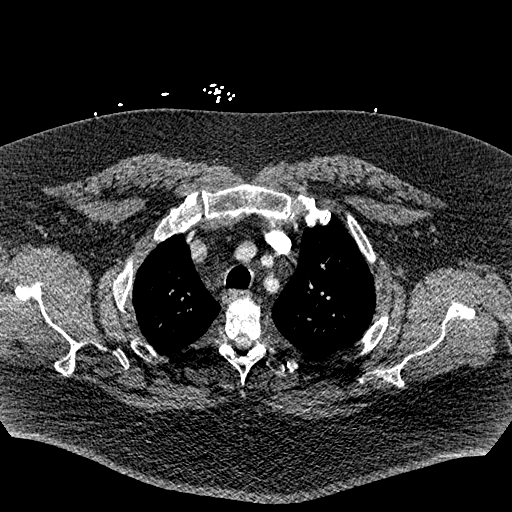
[im 209/262  lung]
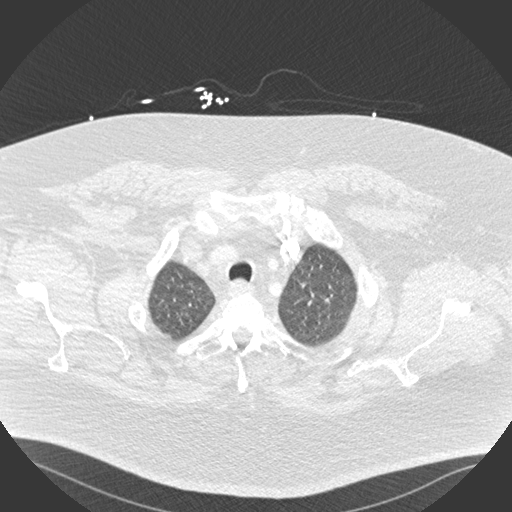
[im 222/262  mediastinal]
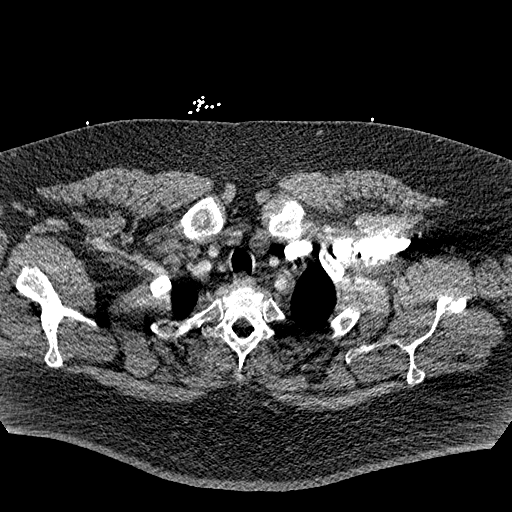
[im 235/262  lung]
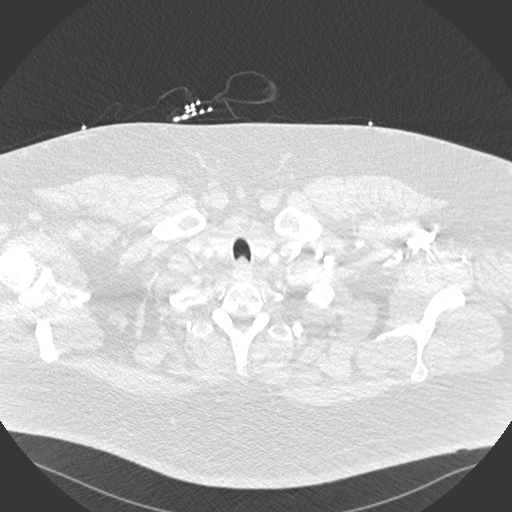
[im 248/262  mediastinal]
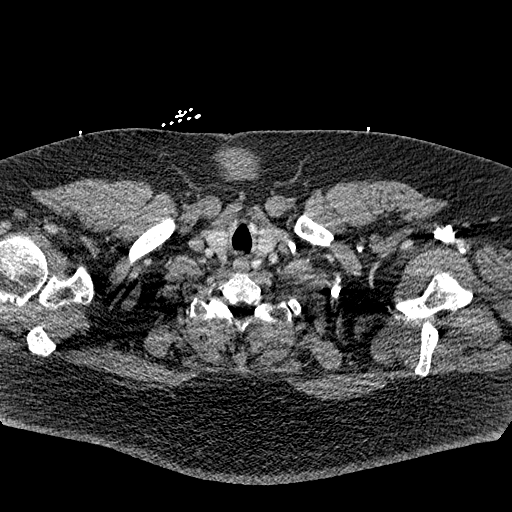

[Series 8: coronal mpr · coronal · 0.52mm/px · 1 of 151 slices shown]
[im 76/151  mediastinal]
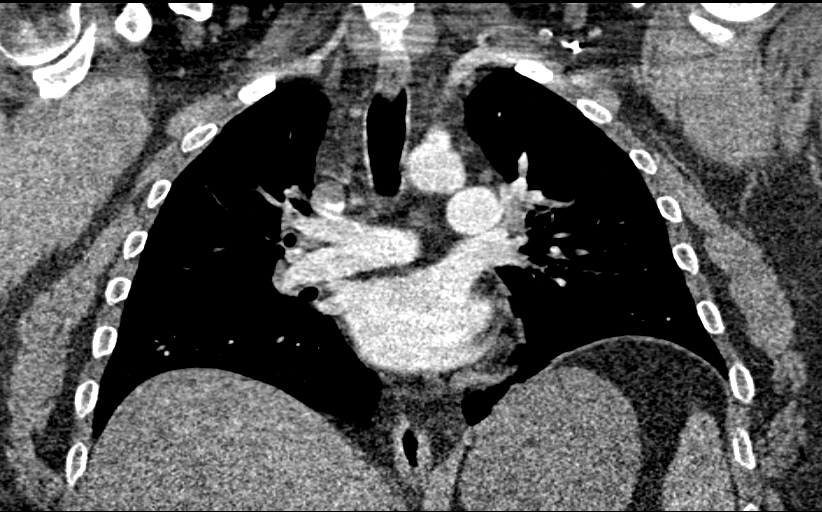

[19 of 36 positions shown; findings below may reference images not displayed]

FINDINGS: Mediastinum/Lymph Nodes: The thoracic inlet is within normal limits.
Mild lipomatosis of the mediastinum is noted. No significant hilar
or mediastinal adenopathy is seen.

Cardiovascular: The thoracic aorta and its branches show no
aneurysmal dilatation or dissection. Pulmonary artery is well
visualized within normal branching pattern. No filling defects to
suggest pulmonary emboli are noted.

Lungs/Pleura: Mild dependent atelectatic changes are noted. No focal
confluent infiltrate is noted. No significant pulmonary edema is
noted.

Upper abdomen: No acute findings.

Musculoskeletal: No chest wall mass or suspicious bone lesions
identified.

Review of the MIP images confirms the above findings.
IMPRESSION: No evidence of pulmonary emboli.

No significant CHF.

Minimal dependent atelectatic changes are noted.

## 2017-07-07 IMAGING — CR DG CHEST 2V
2 series · 2 of 2 positions shown · non-contrast
Comparison: 10/04/2015

CLINICAL DATA: Shortness of breath

EXAM:
CHEST  2 VIEW

[w chest lat]
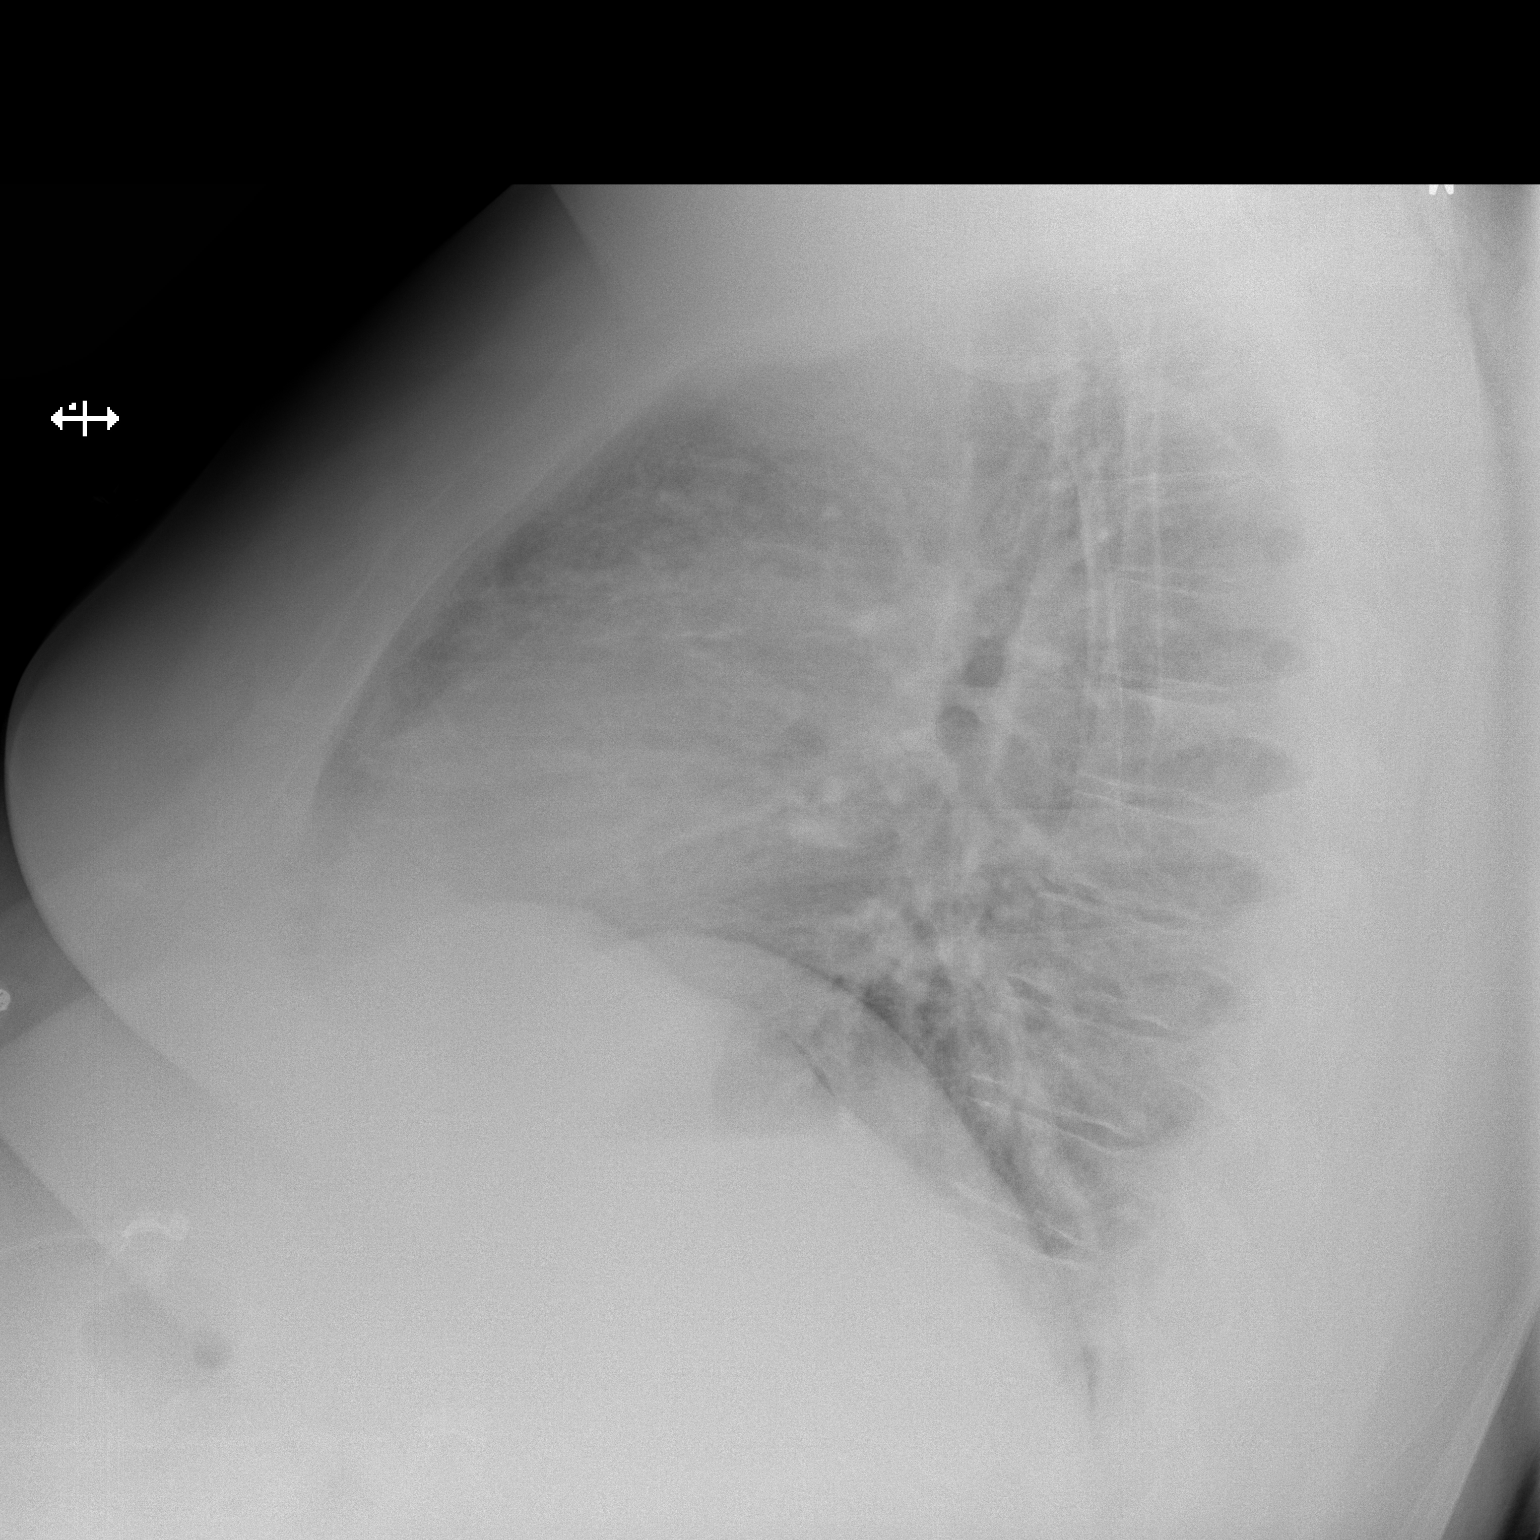

[x chest ap]
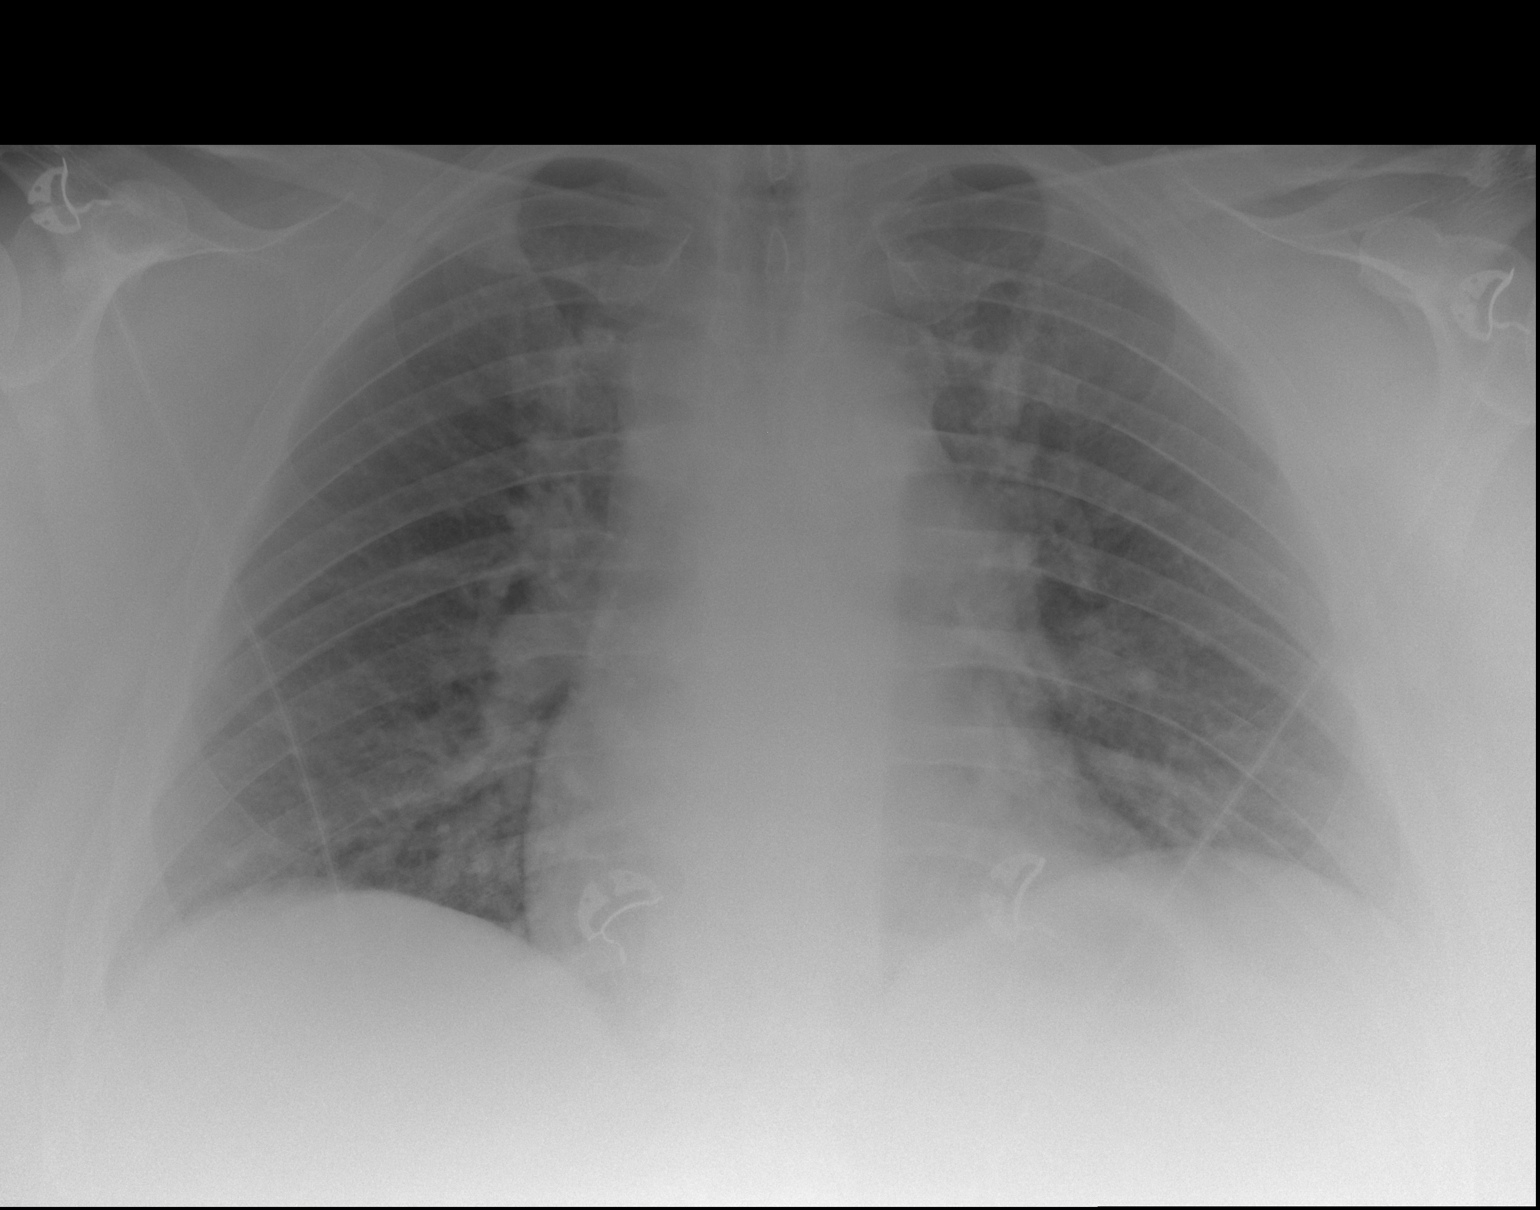

[2 of 2 positions shown; findings below may reference images not displayed]

FINDINGS: Heart size is normal. There is no pleural effusion or edema.
Pulmonary vascular congestion noted. The lung volumes appear low. No
airspace consolidation.
IMPRESSION: Pulmonary vascular congestion.

## 2017-07-08 ENCOUNTER — Other Ambulatory Visit: Payer: Self-pay | Admitting: Nurse Practitioner

## 2017-07-08 DIAGNOSIS — M5030 Other cervical disc degeneration, unspecified cervical region: Secondary | ICD-10-CM

## 2017-07-13 IMAGING — CT CT ABD-PELV W/ CM
2 of 5 series · 16 of 46 positions shown, 18 images · IV contrast (ISOVUE)
Comparison: Abdominal ultrasound performed 05/17/2015, and CT of
the abdomen and pelvis from 05/16/2015

CLINICAL DATA: Acute onset of generalized abdominal pain. Initial
encounter.

EXAM:
CT ABDOMEN AND PELVIS WITH CONTRAST
TECHNIQUE: Multidetector CT imaging of the abdomen and pelvis was performed
using the standard protocol following bolus administration of
intravenous contrast.
CONTRAST:  100mL 8DME7Z-KPP IOPAMIDOL ([id number]-KPP) INJECTION 61%

[Series 2: abd/pel with · axial · 0.95mm/px · z∈[+1004,+1534]mm · 13 of 122 slices shown, 15 images]
[im 8/122  soft-tissue]
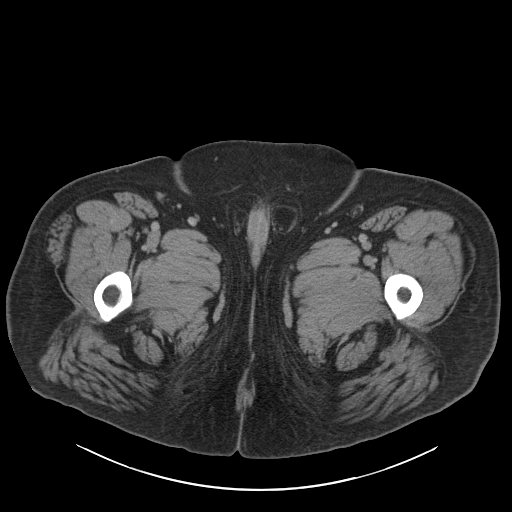
[im 8/122  bone]
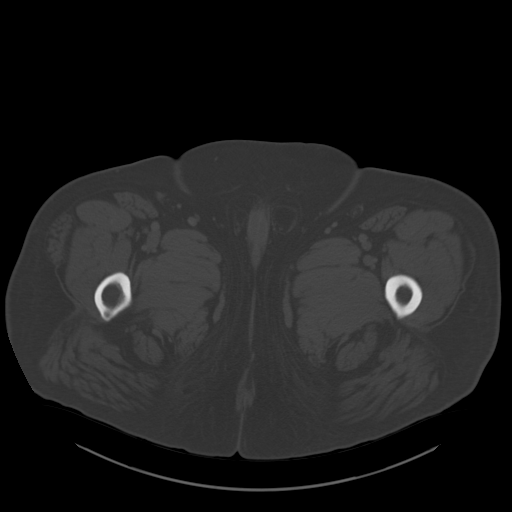
[im 16/122  soft-tissue]
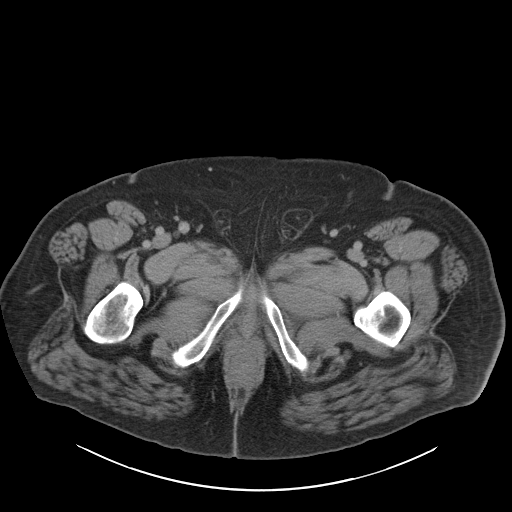
[im 23/122  soft-tissue]
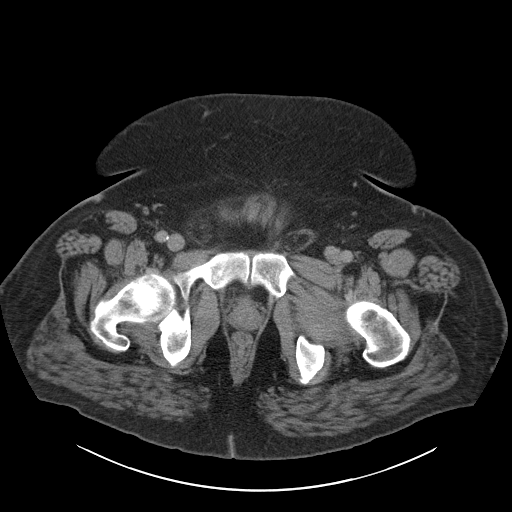
[im 38/122  soft-tissue]
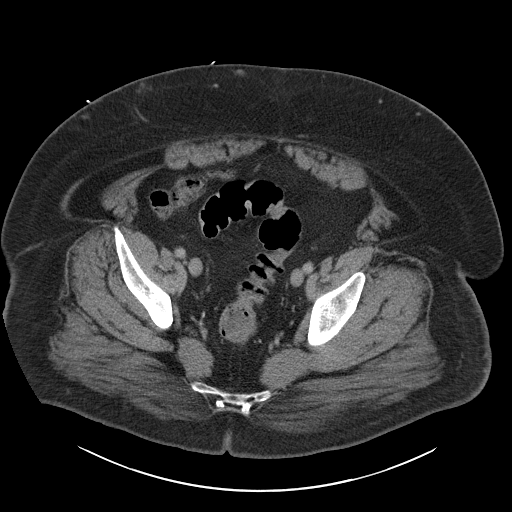
[im 46/122  soft-tissue]
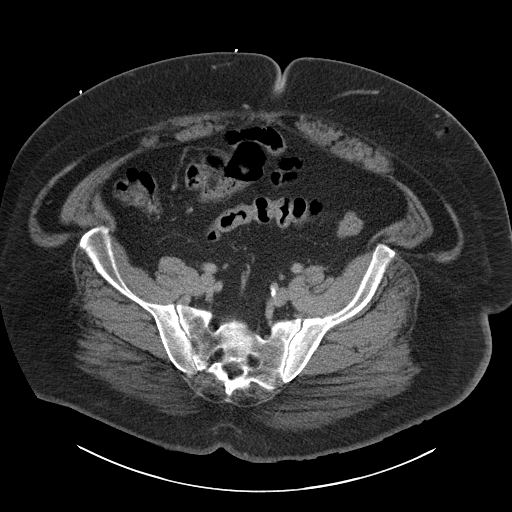
[im 53/122  soft-tissue]
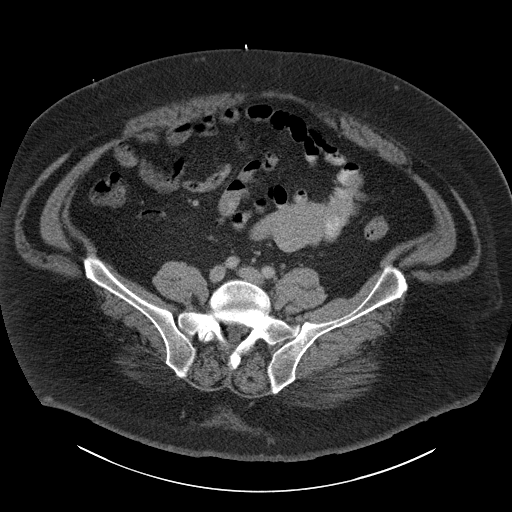
[im 61/122  soft-tissue]
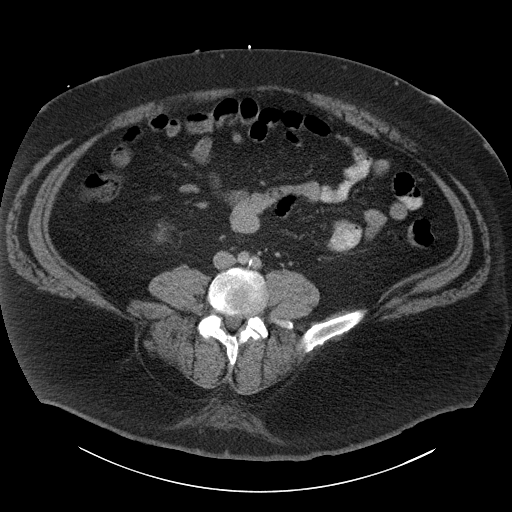
[im 69/122  soft-tissue]
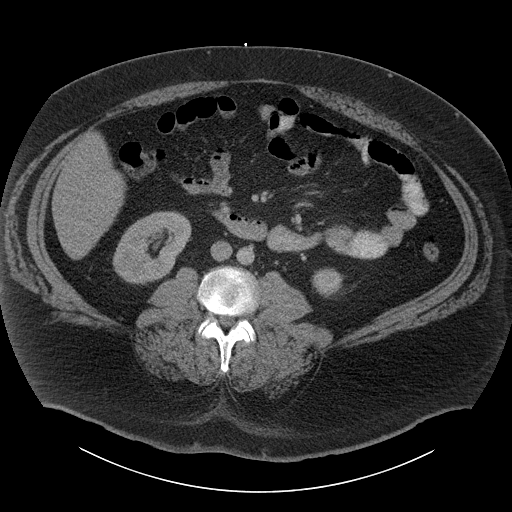
[im 76/122  soft-tissue]
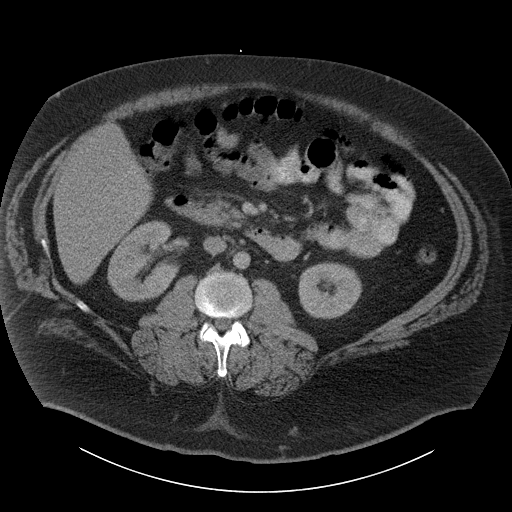
[im 76/122  bone]
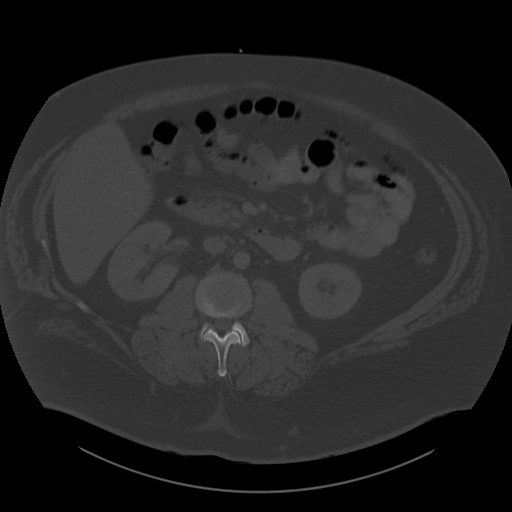
[im 84/122  soft-tissue]
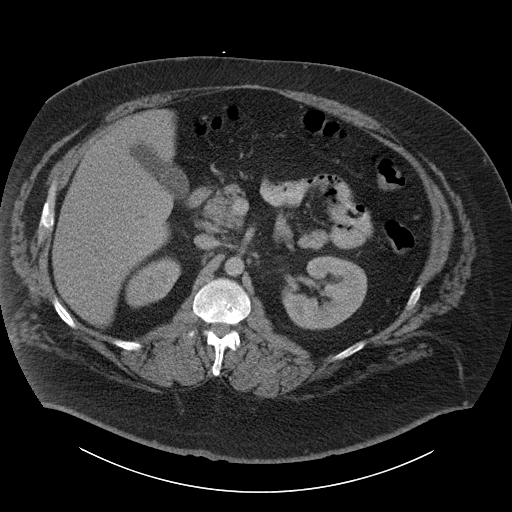
[im 99/122  soft-tissue]
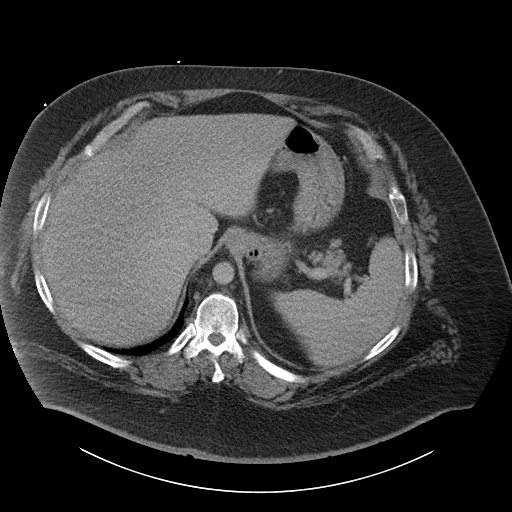
[im 106/122  soft-tissue]
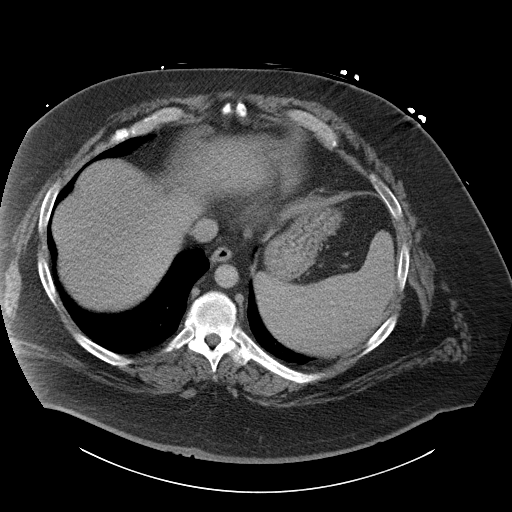
[im 114/122  soft-tissue]
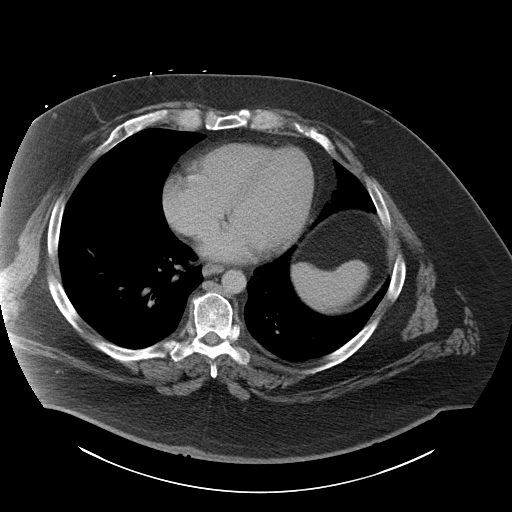

[Series 5: coronal a/|p · coronal · 0.98mm/px · 3 of 191 slices shown]
[im 64/191  soft-tissue]
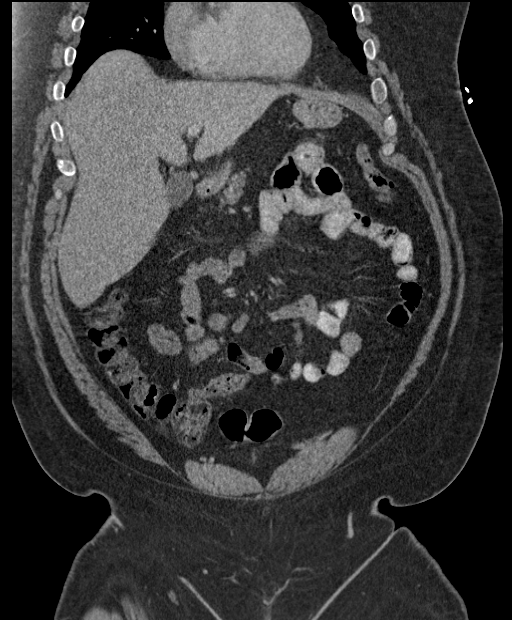
[im 85/191  soft-tissue]
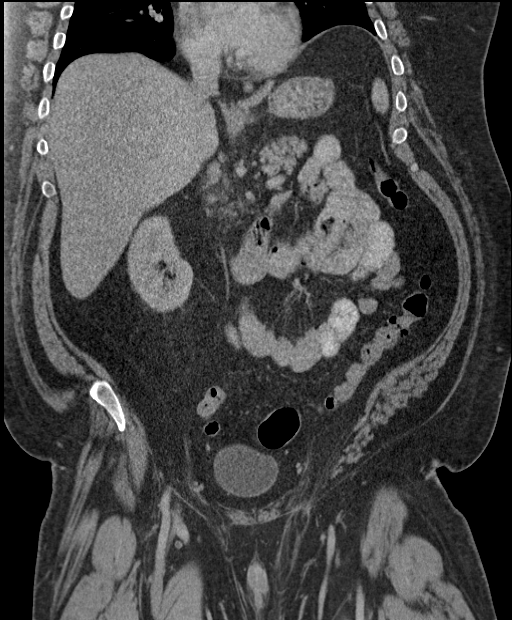
[im 106/191  soft-tissue]
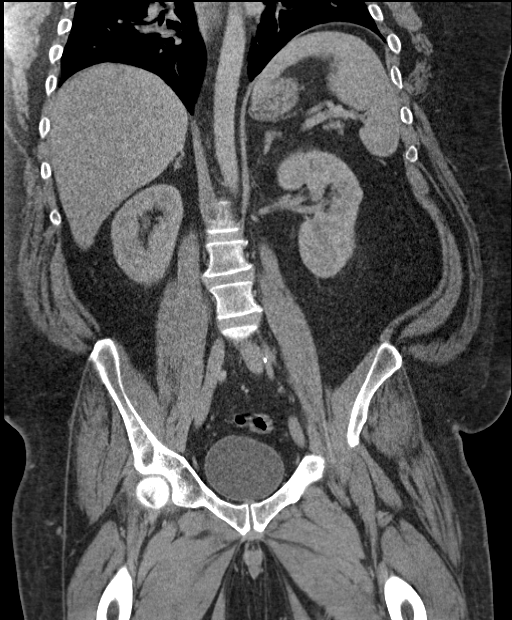

[16 of 46 positions shown; findings below may reference images not displayed]

FINDINGS: The visualized lung bases are clear.

The liver and spleen are unremarkable in appearance. The gallbladder
is within normal limits. The pancreas and adrenal glands are
unremarkable.

The kidneys are unremarkable in appearance. There is no evidence of
hydronephrosis. No renal or ureteral stones are seen. Mild
nonspecific perinephric stranding is noted bilaterally.

No free fluid is identified. The small bowel is unremarkable in
appearance. The stomach is within normal limits. No acute vascular
abnormalities are seen.

The appendix is normal in caliber, without evidence of appendicitis.
The colon is grossly unremarkable.

The bladder is mildly distended and grossly unremarkable. The
prostate remains normal in size. No inguinal lymphadenopathy is
seen.

No acute osseous abnormalities are identified.
IMPRESSION: Unremarkable contrast-enhanced CT of the abdomen and pelvis.

## 2017-07-14 ENCOUNTER — Ambulatory Visit
Admission: RE | Admit: 2017-07-14 | Discharge: 2017-07-14 | Disposition: A | Discharge status: Home / Self Care | Payer: Medicare Other | Source: Ambulatory Visit | Attending: Nurse Practitioner | Admitting: Nurse Practitioner

## 2017-07-14 DIAGNOSIS — M5030 Other cervical disc degeneration, unspecified cervical region: Secondary | ICD-10-CM

## 2017-07-23 IMAGING — DX DG CHEST 1V PORT
1 series · 1 of 1 positions shown · non-contrast
Comparison: 10/08/2015

CLINICAL DATA: Left chest pain, right upper abdominal pain, and
shortness of breath for 2 days. Patient was intubated in [REDACTED].
Hypertension, smoker, asthma.

EXAM:
PORTABLE CHEST 1 VIEW

[chest ap]
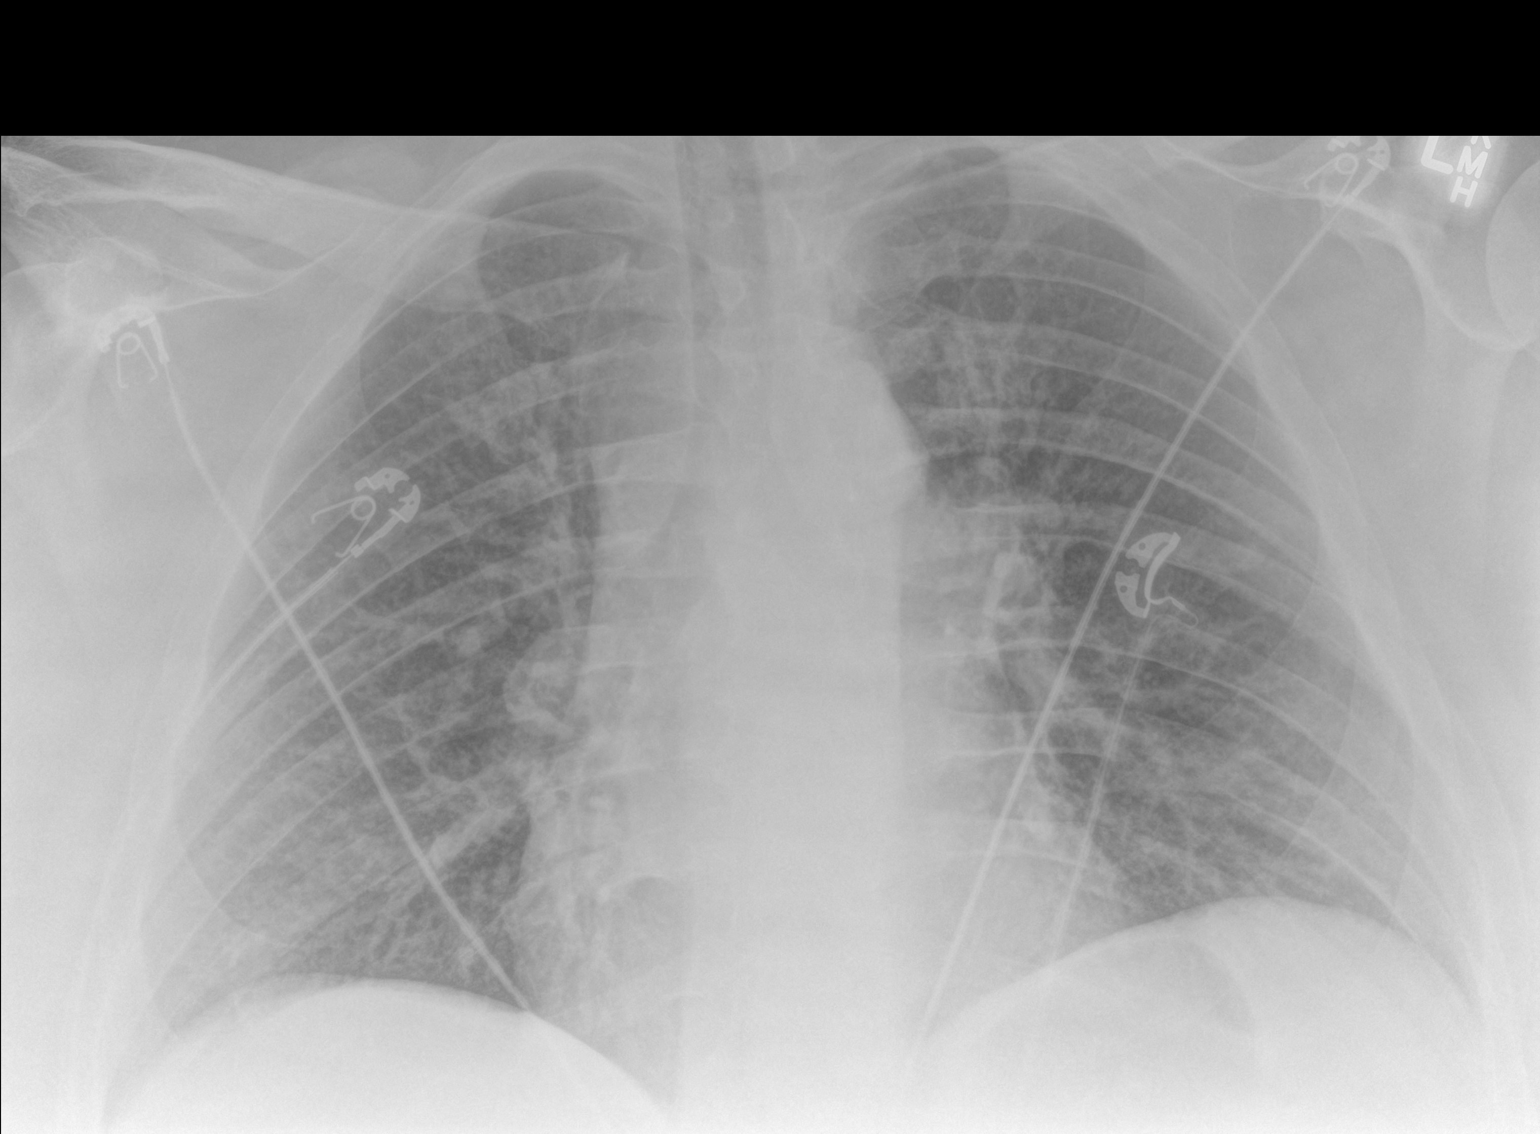

[1 of 1 positions shown; findings below may reference images not displayed]

FINDINGS: Shallow inspiration. Normal heart size and pulmonary vascularity. No
focal airspace disease or consolidation in the lungs. No blunting of
costophrenic angles. No pneumothorax. Mediastinal contours appear
intact.
IMPRESSION: No active disease.

## 2017-08-18 IMAGING — CR DG CHEST 2V
2 series · 2 of 2 positions shown · non-contrast
Comparison: 10/24/2015

CLINICAL DATA: Shortness of breath with chest pain.

EXAM:
CHEST  2 VIEW

[chest pa]
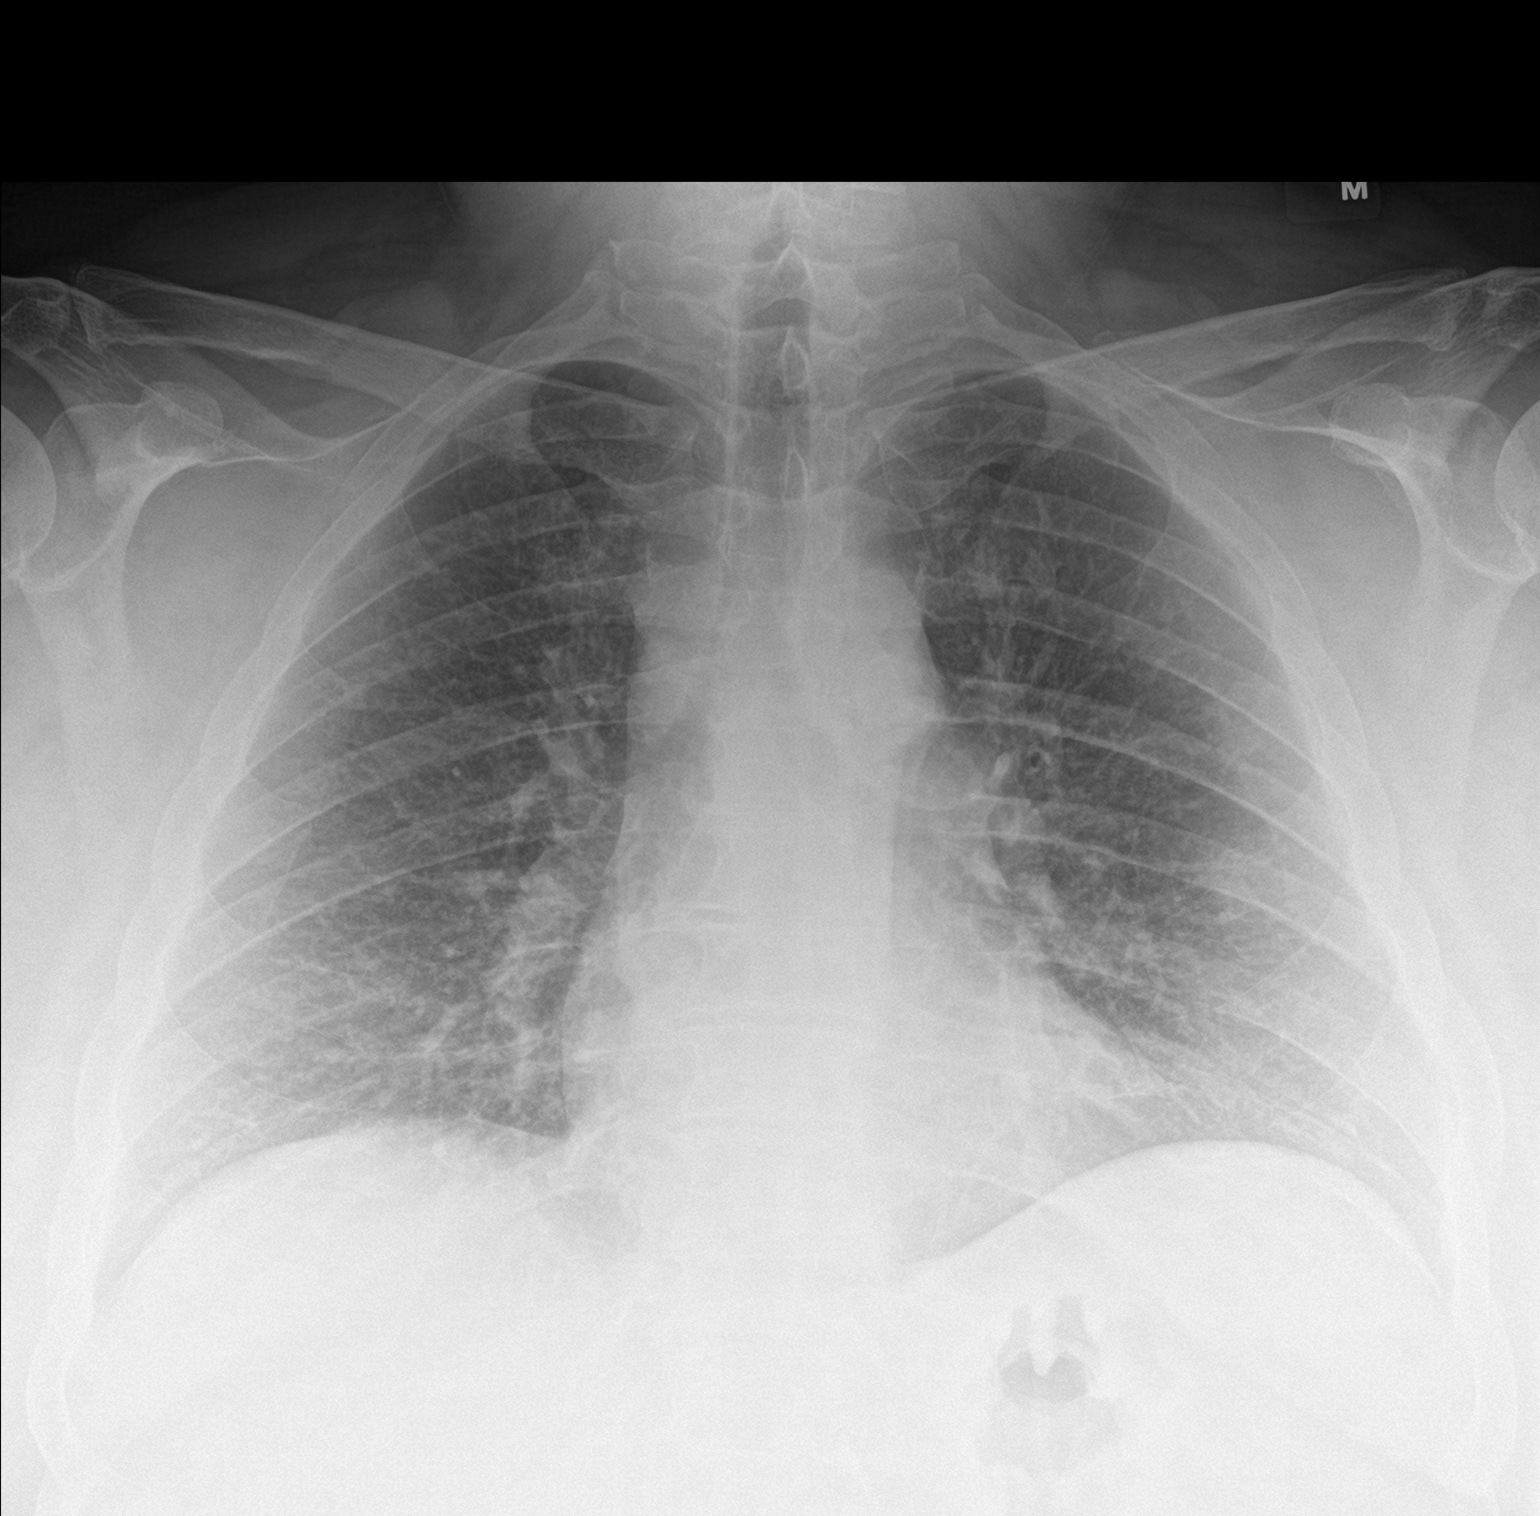

[chest lat]
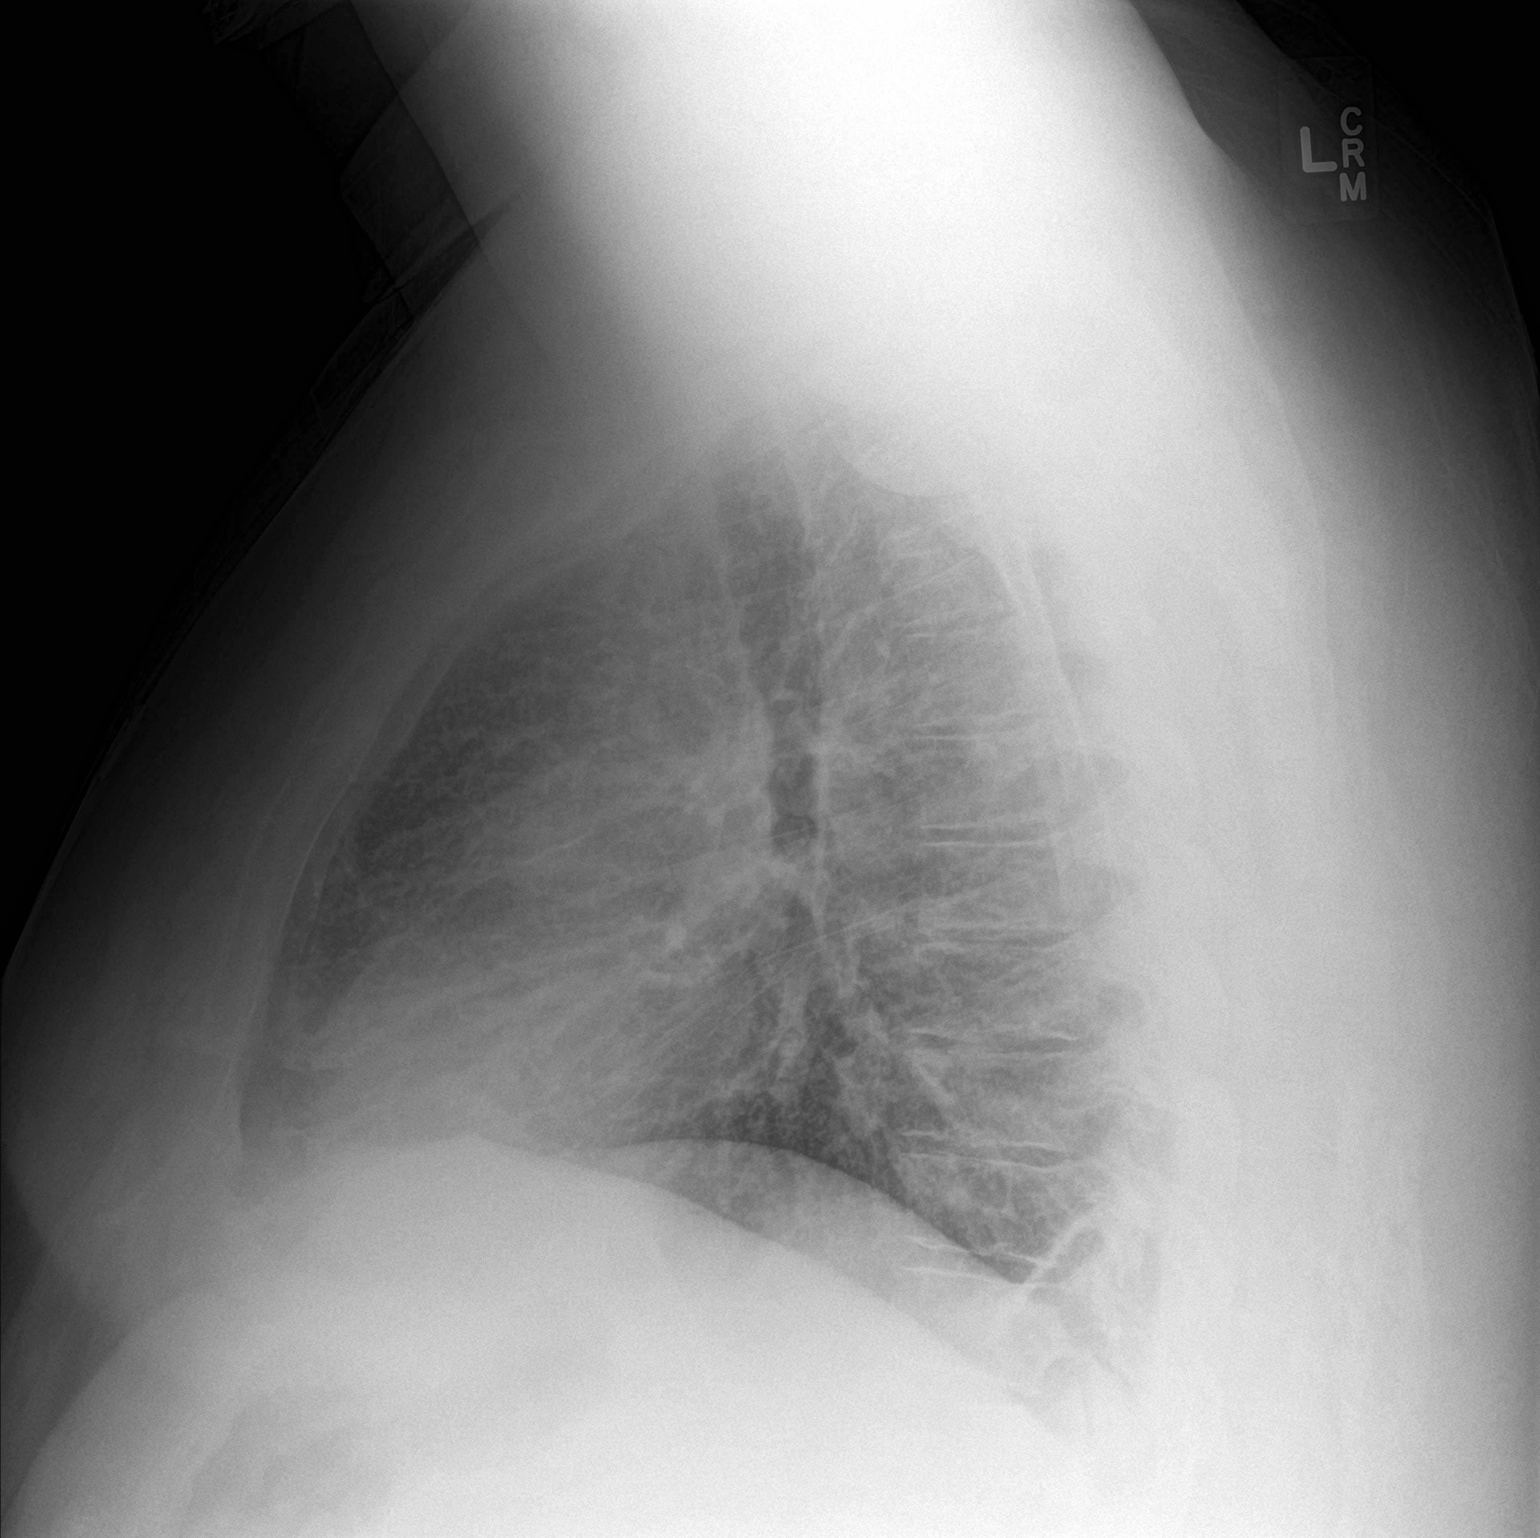

[2 of 2 positions shown; findings below may reference images not displayed]

FINDINGS: Coarse lung markings appear to be chronic. There is no focal
airspace disease or pulmonary edema. Heart and mediastinum are
within normal limits. Trachea is midline. No large pleural
effusions. No acute bone abnormality.
IMPRESSION: No active cardiopulmonary disease.

## 2017-09-30 IMAGING — CT CT HEAD W/O CM
3 series · 14 of 47 positions shown, 16 images · non-contrast
Comparison: CT of the head performed 08/21/2015, and MRI of the
brain performed 04/08/2015

CLINICAL DATA: Acute onset of altered mental status. Overdose on
Xanax. Patient unresponsive. Insert normal

EXAM:
CT HEAD WITHOUT CONTRAST
TECHNIQUE: Contiguous axial images were obtained from the base of the skull
through the vertex without intravenous contrast.

[Series 2: head trauma wo · axial · 0.48mm/px · z∈[+1543,+1683]mm · 8 of 34 slices shown, 10 images]
[im 3/34  brain]
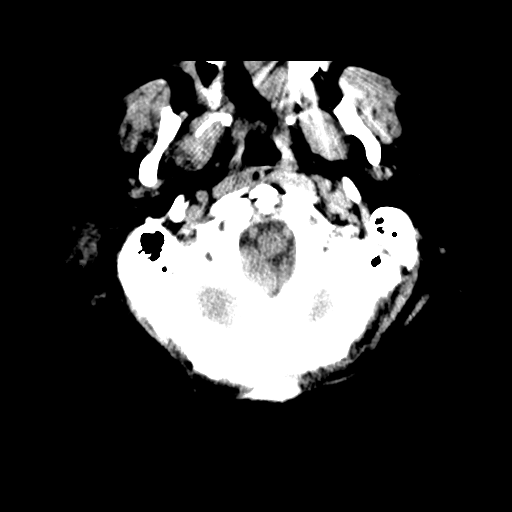
[im 3/34  bone]
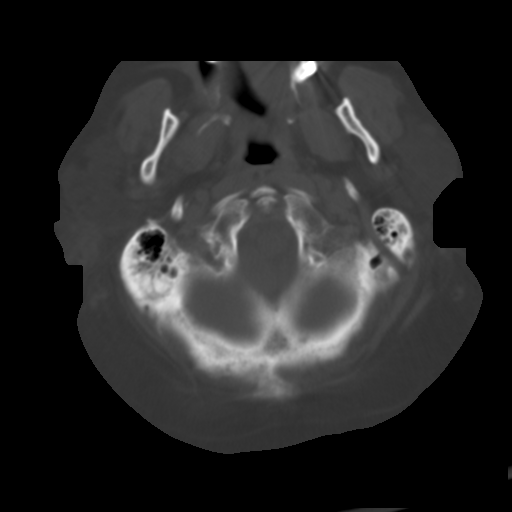
[im 7/34  brain]
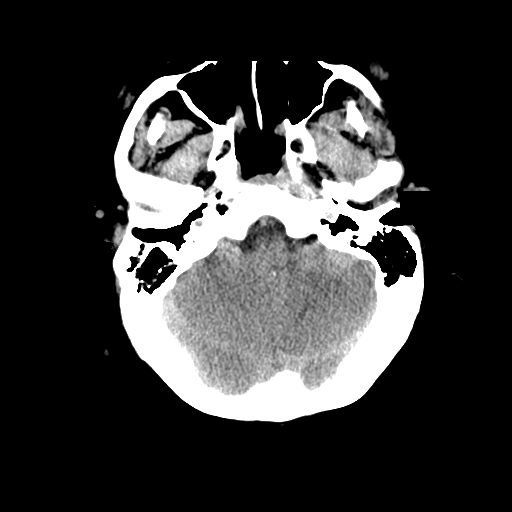
[im 11/34  brain]
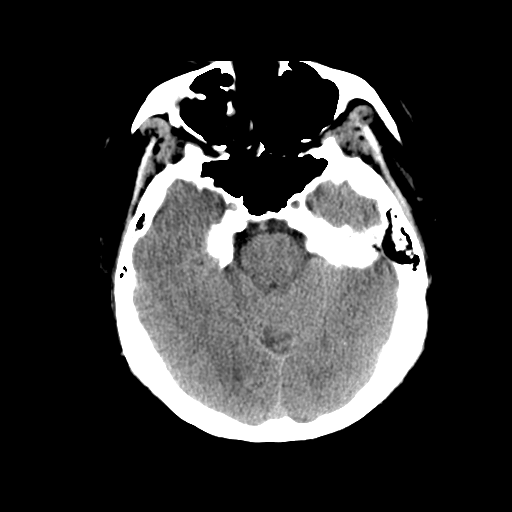
[im 15/34  brain]
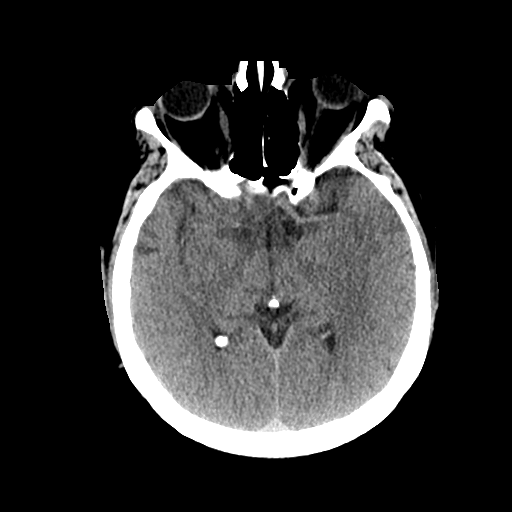
[im 19/34  brain]
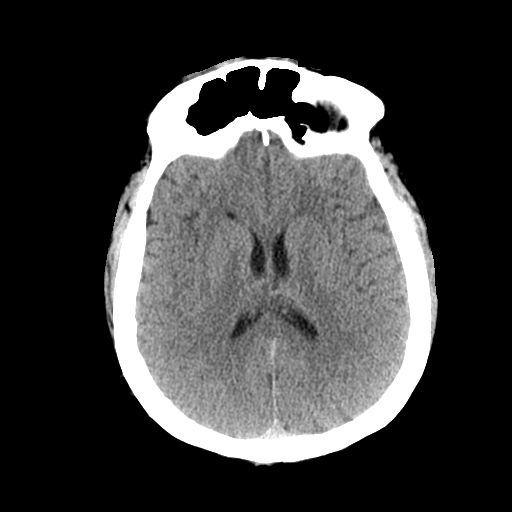
[im 19/34  bone]
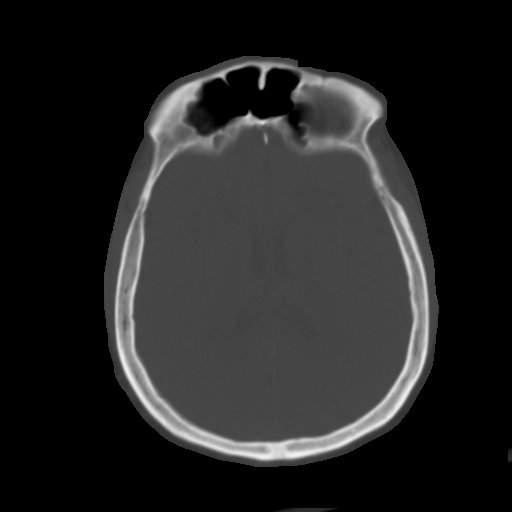
[im 23/34  brain]
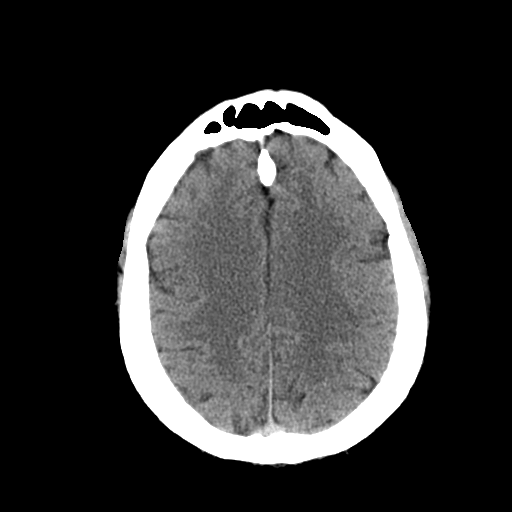
[im 27/34  brain]
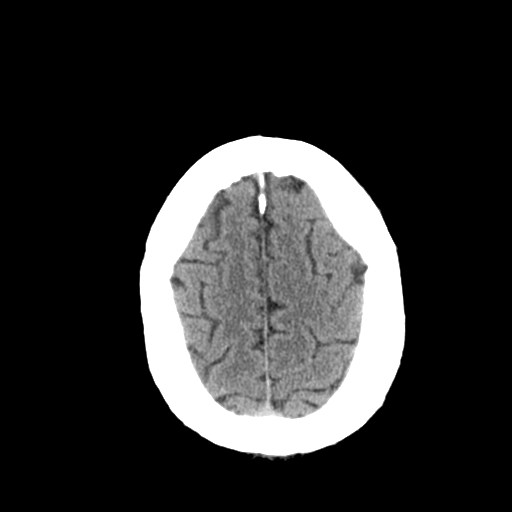
[im 31/34  brain]
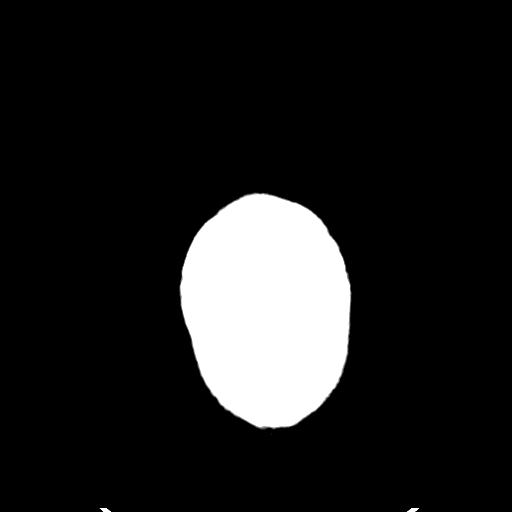

[Series 4: coronal soft tissue · coronal · 0.34mm/px · 3 of 84 slices shown]
[im 28/84  brain]
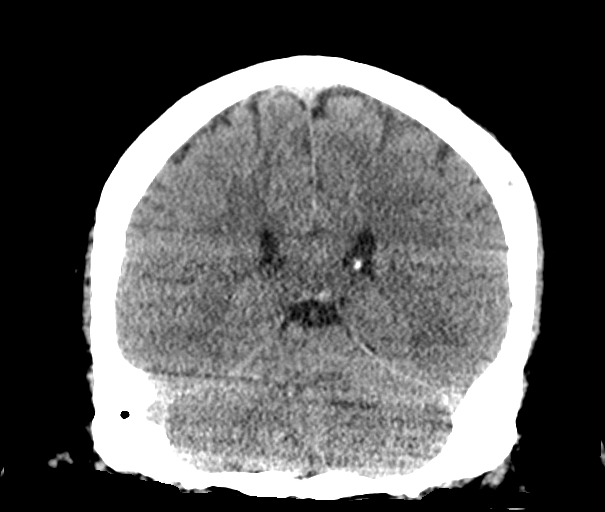
[im 37/84  brain]
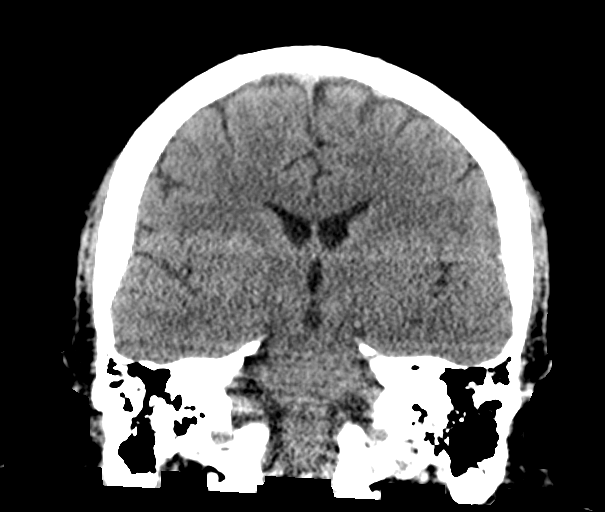
[im 47/84  brain]
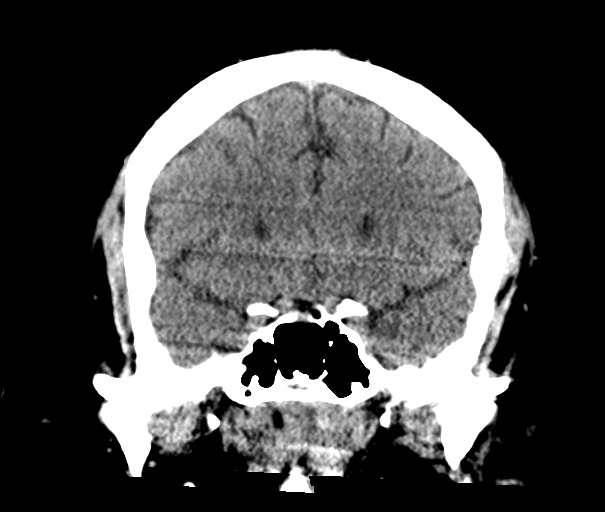

[Series 5: sagittal soft tissue · sagittal · 0.34mm/px · 3 of 59 slices shown]
[im 20/59  brain]
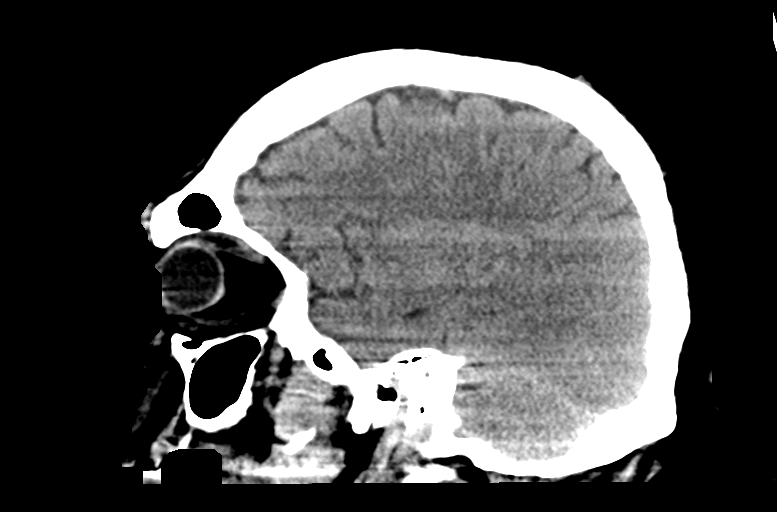
[im 30/59  brain]
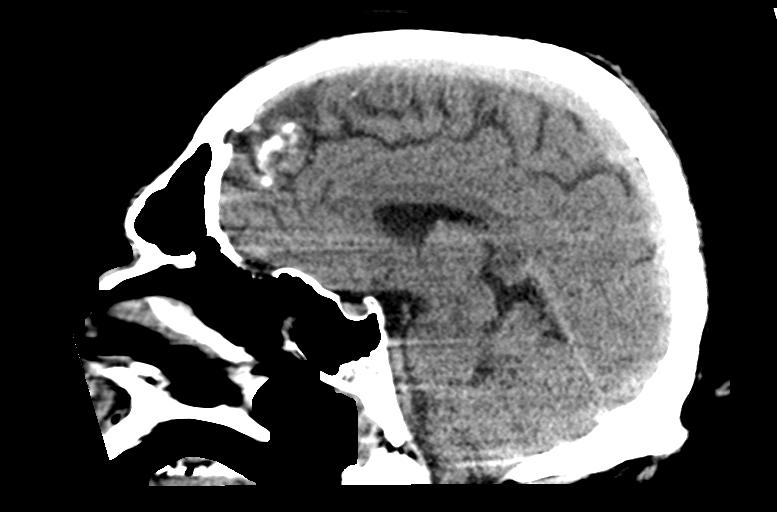
[im 39/59  brain]
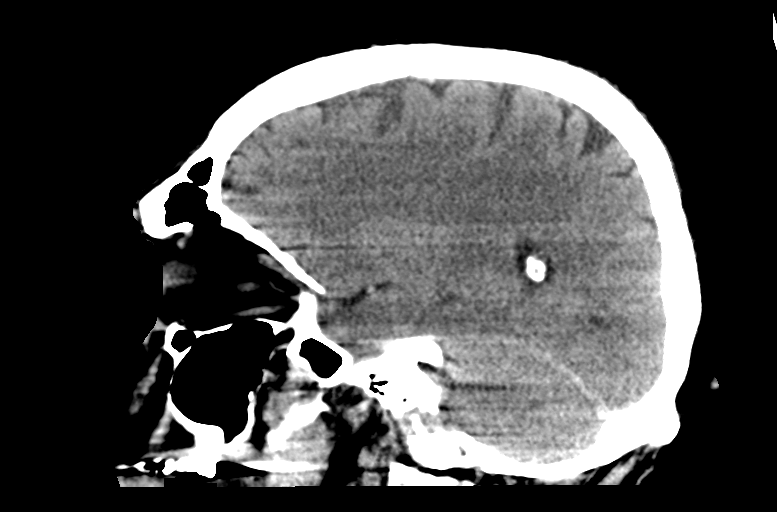

[14 of 47 positions shown; findings below may reference images not displayed]

FINDINGS: Brain: No evidence of acute infarction, hemorrhage, hydrocephalus,
extra-axial collection or mass lesion/mass effect.

The posterior fossa, including the cerebellum, brainstem and fourth
ventricle, is within normal limits. The third and lateral
ventricles, and basal ganglia are unremarkable in appearance. The
cerebral hemispheres are symmetric in appearance, with normal
gray-white differentiation. No mass effect or midline shift is seen.

Vascular: No hyperdense vessel or unexpected calcification.

Skull: There is no evidence of fracture; visualized osseous
structures are unremarkable in appearance.

Sinuses/Orbits: The orbits are within normal limits. The paranasal
sinuses and mastoid air cells are well-aerated.

Other: No significant soft tissue abnormalities are seen.
IMPRESSION: Unremarkable noncontrast CT of the head.

## 2017-09-30 IMAGING — CR DG CHEST 1V PORT
1 series · 1 of 1 positions shown · non-contrast
Comparison: Prior radiograph from 11/19/2015.

CLINICAL DATA: Initial evaluation for acute altered mental status.

EXAM:
PORTABLE CHEST 1 VIEW

[ap]
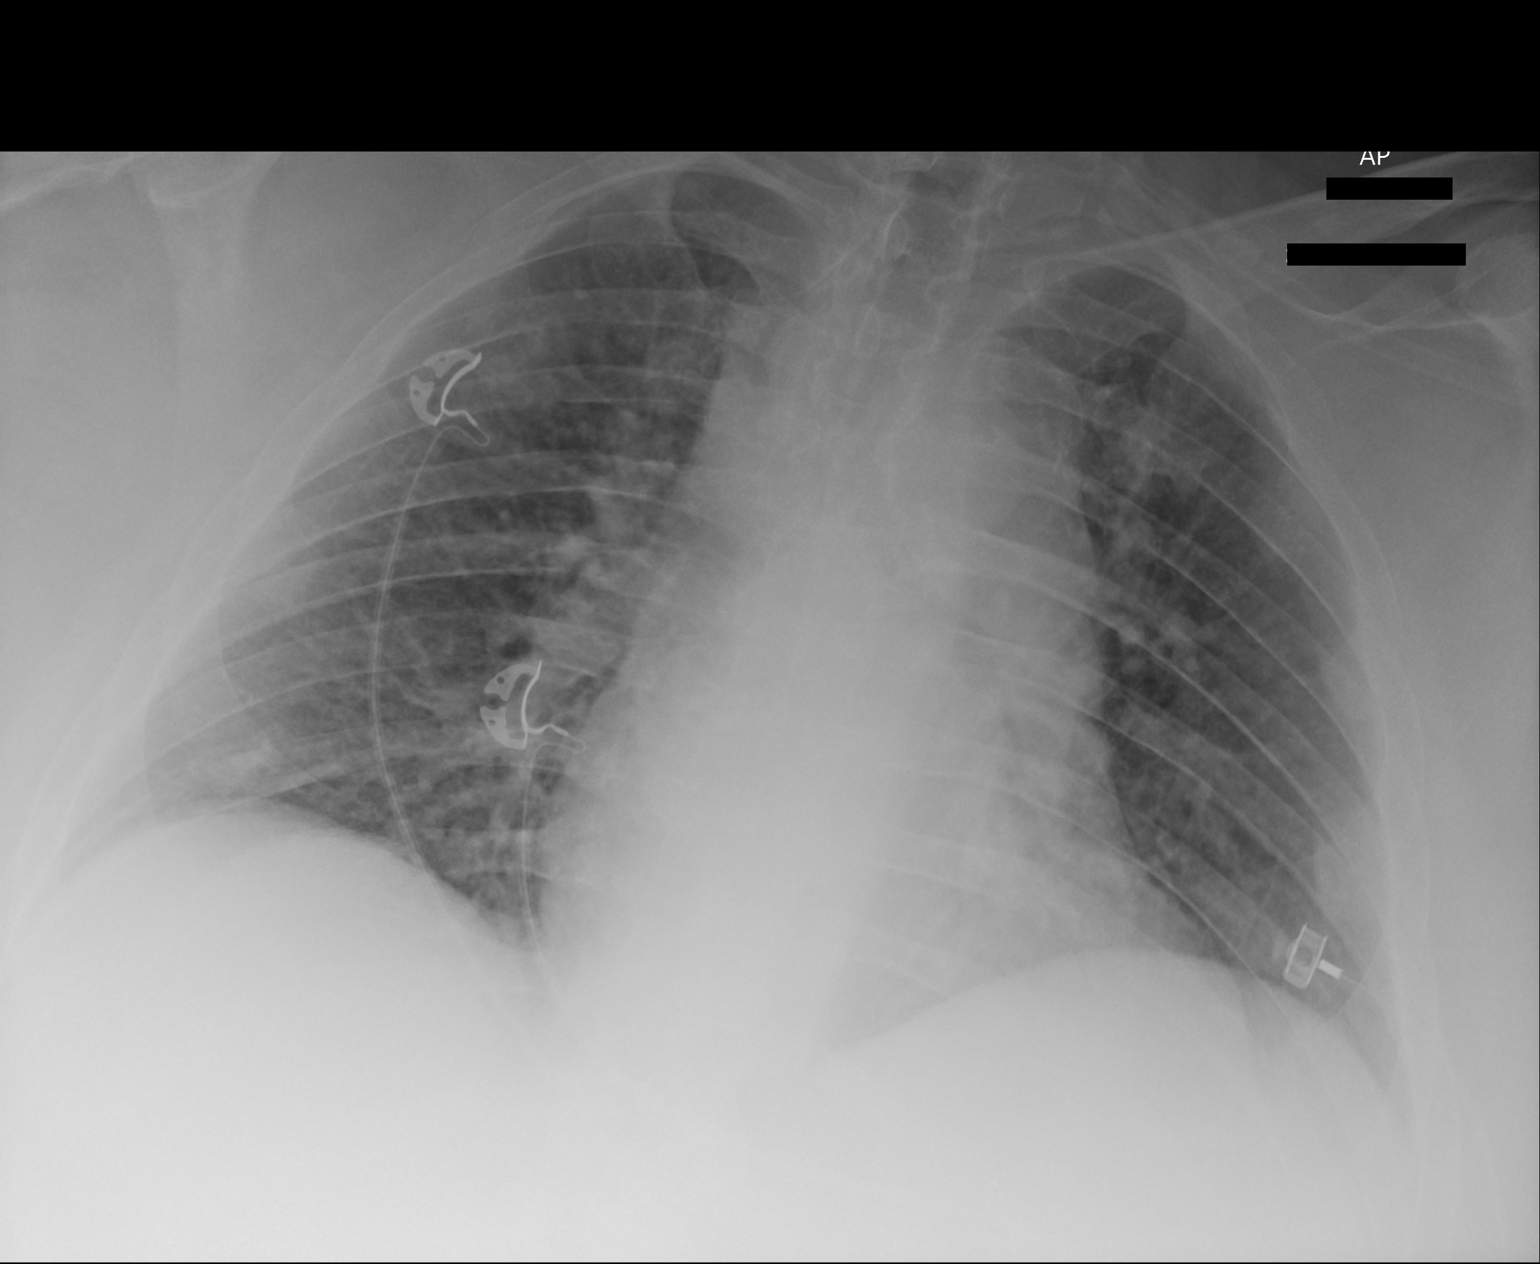

[1 of 1 positions shown; findings below may reference images not displayed]

FINDINGS: Mild cardiomegaly, stable. Mediastinal silhouette within normal
limits.

Lungs mildly hypoinflated. Diffuse vascular congestion without overt
pulmonary edema. Slightly more confluent patchy bibasilar opacities,
which may reflect atelectasis or possibly infiltrates. No pleural
effusion. No pneumothorax.

No acute osseous abnormality.
IMPRESSION: 1. Shallow lung inflation with mild patchy bibasilar opacities,
which may reflect atelectasis or infiltrates. Aspiration could be
considered as well.
2. Mild cardiomegaly with diffuse pulmonary vascular congestion. No
overt pulmonary edema.

## 2017-10-01 IMAGING — DX DG CHEST 2V
2 series · 2 of 2 positions shown · non-contrast
Comparison: November 19, 2015 and January 01, 2016

CLINICAL DATA: Shortness of breath.

EXAM:
CHEST  2 VIEW

[chest pa]
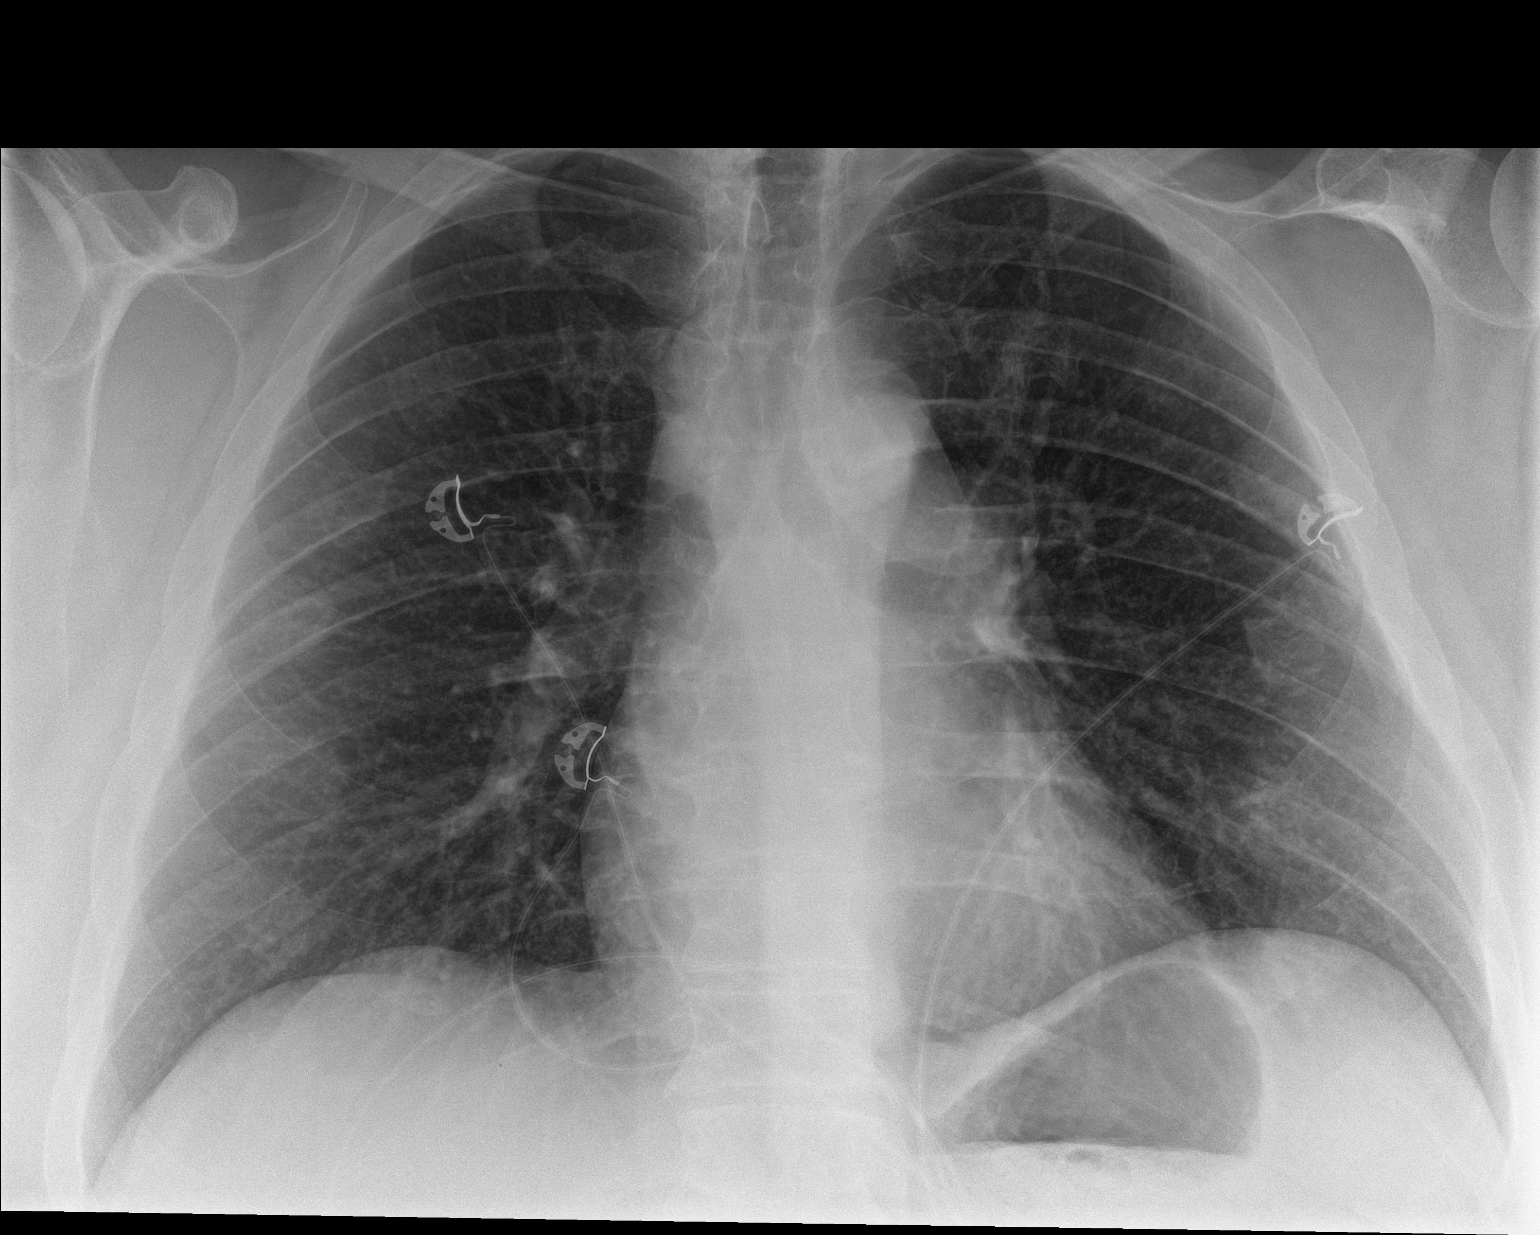

[chest lat]
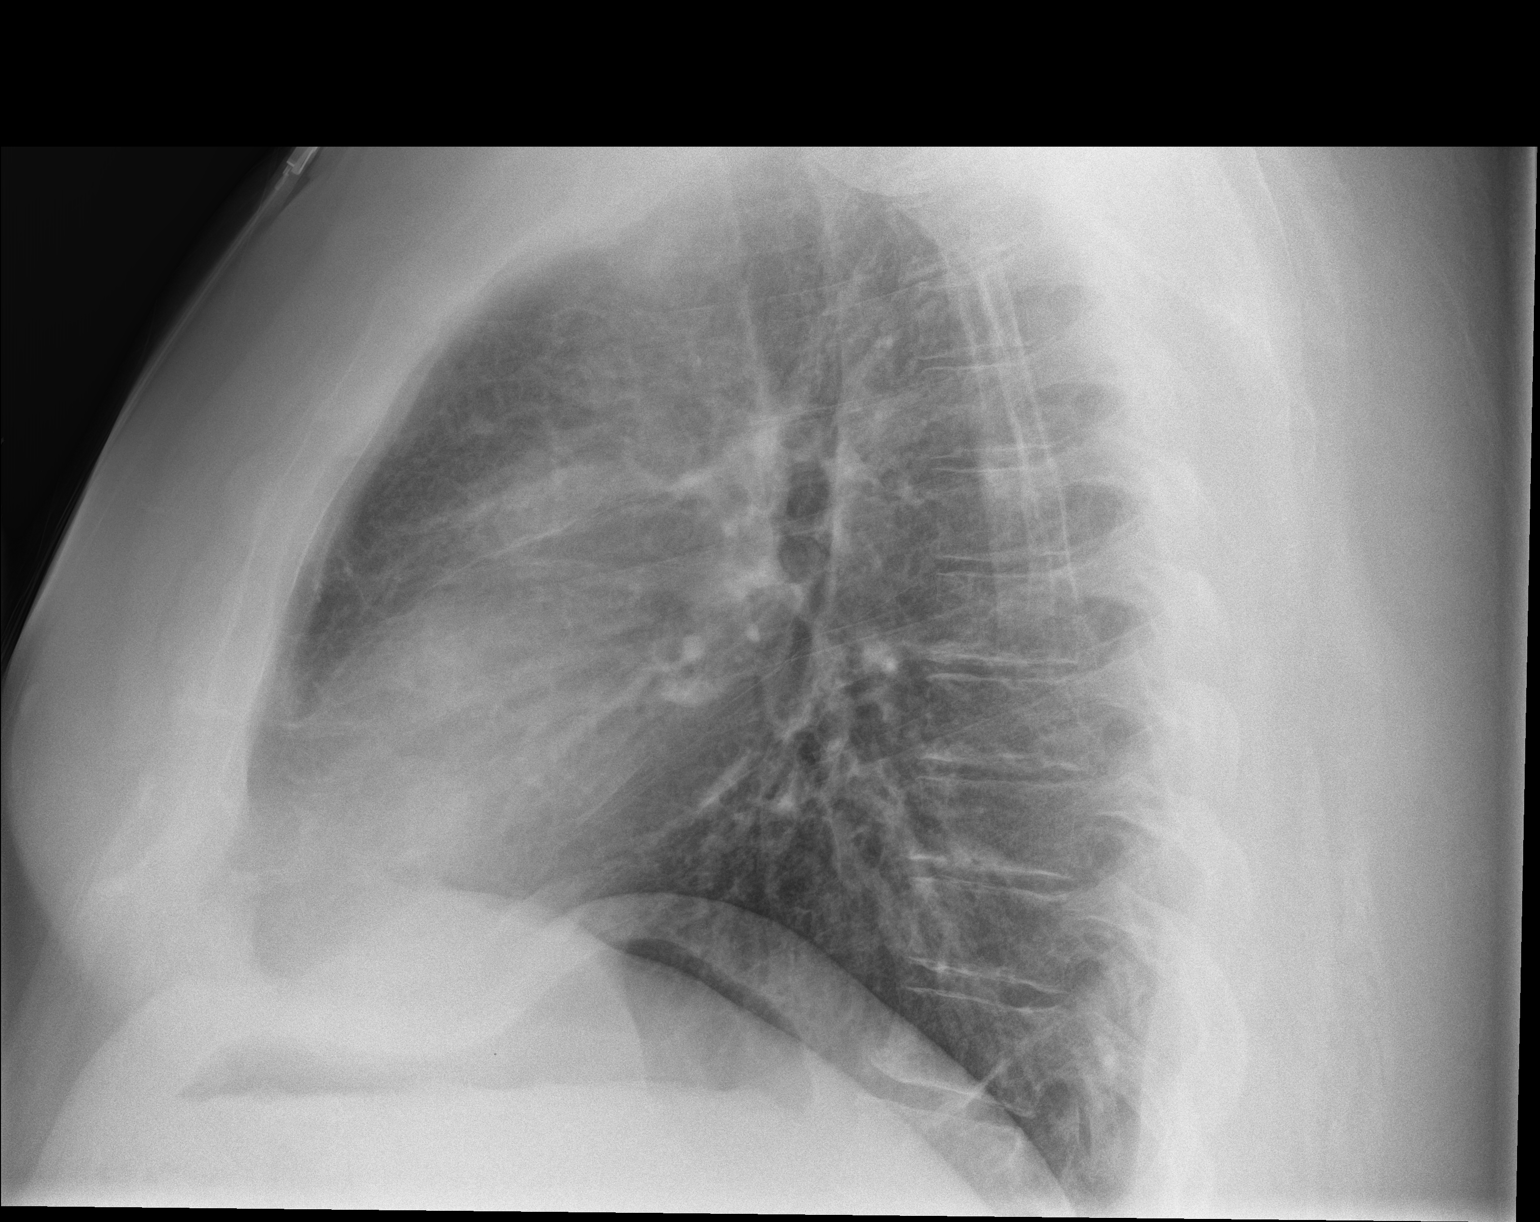

[2 of 2 positions shown; findings below may reference images not displayed]

FINDINGS: The heart, hila, mediastinum, lungs, and pleura are unremarkable. No
cause for shortness of breath. The rounded density over the lateral
left lung is thought to be the inferior tip of the scapula.
Recommend attention on follow-up. No acute abnormalities identified.
IMPRESSION: No active cardiopulmonary disease.

## 2017-10-02 IMAGING — CR DG CHEST 1V PORT
1 series · 1 of 1 positions shown · non-contrast
Comparison: 01/02/2016, 01/01/2016

CLINICAL DATA: 39-year-old male with a history of left-sided chest
pain

EXAM:
PORTABLE CHEST 1 VIEW

[AP]
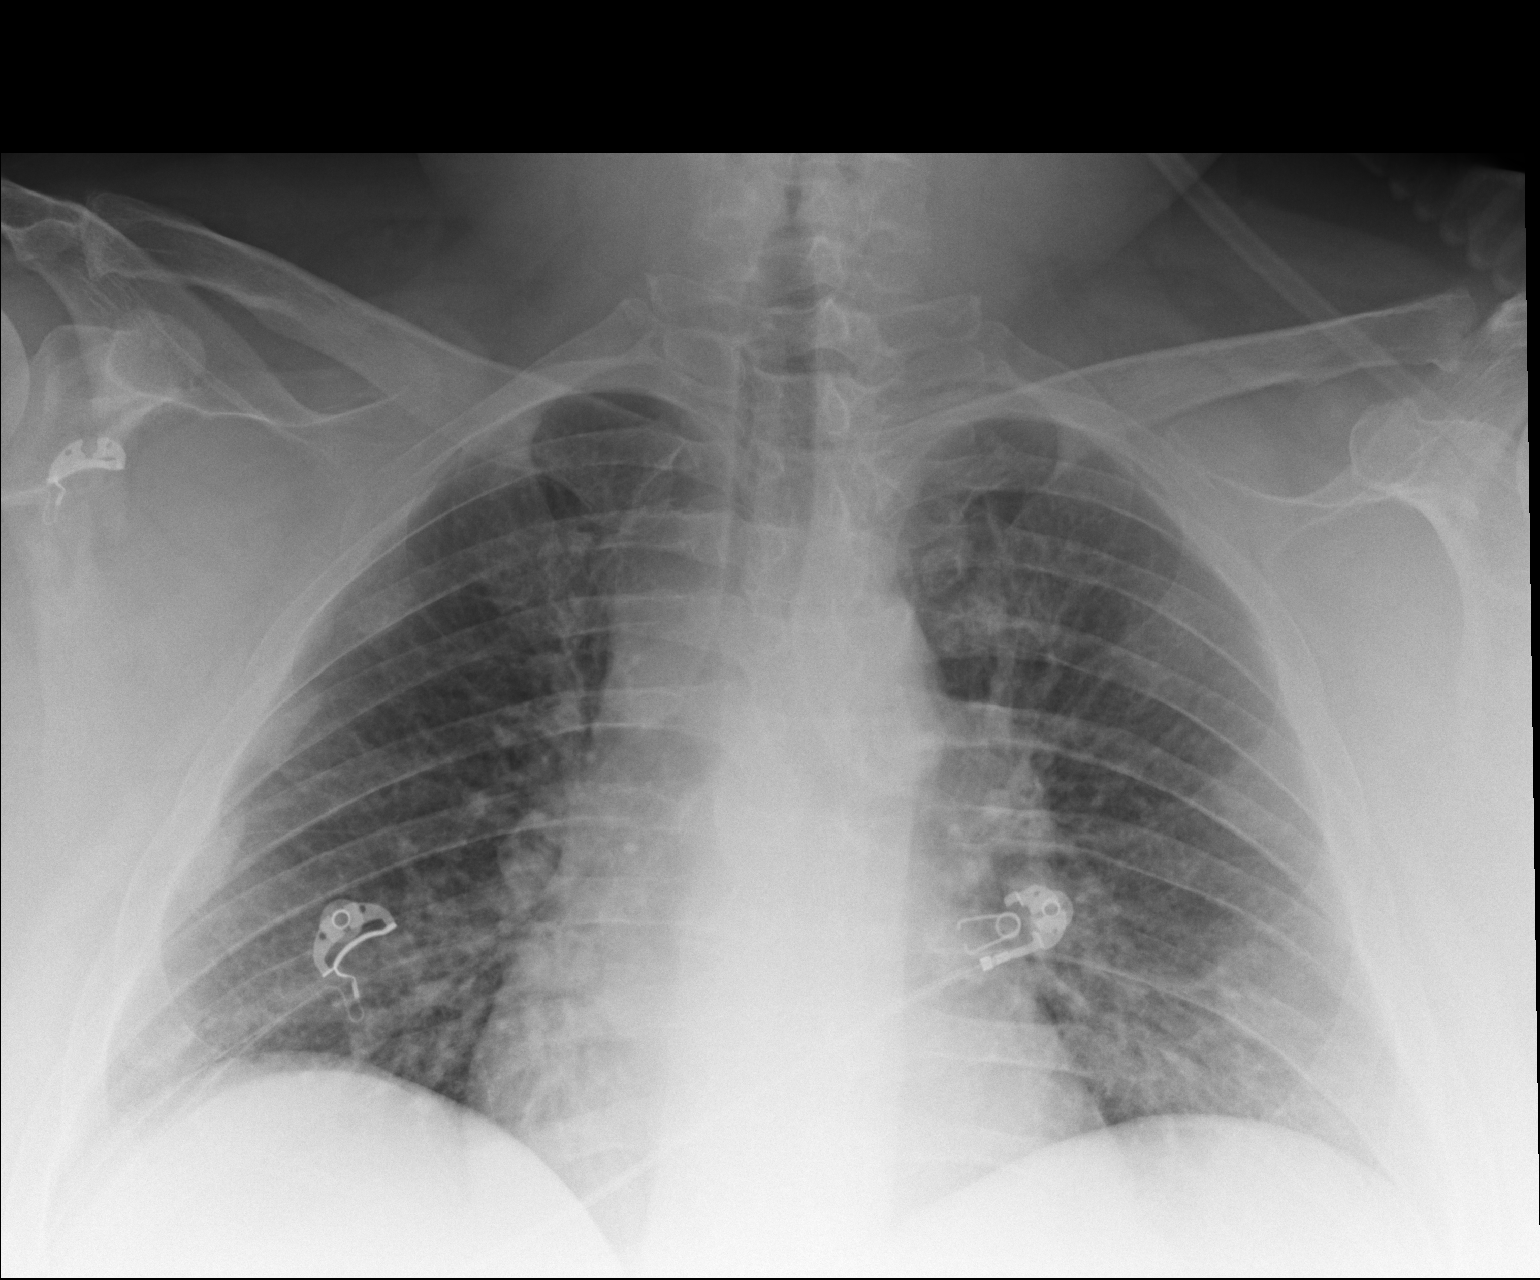

[1 of 1 positions shown; findings below may reference images not displayed]

FINDINGS: Cardiomediastinal silhouette unchanged in size and contour. No
evidence of central vascular congestion.

Low lung volumes accentuates the interstitium with no confluent
airspace disease. No pneumothorax or pleural effusion.
IMPRESSION: Low lung volumes with no evidence of acute cardiopulmonary disease.

## 2017-10-02 IMAGING — CR DG CHEST 1V PORT
1 series · 1 of 1 positions shown · non-contrast
Comparison: Prior today

CLINICAL DATA: Acute respiratory failure. Polysubstance abuse.
Intubation.

EXAM:
PORTABLE CHEST 1 VIEW

[AP]
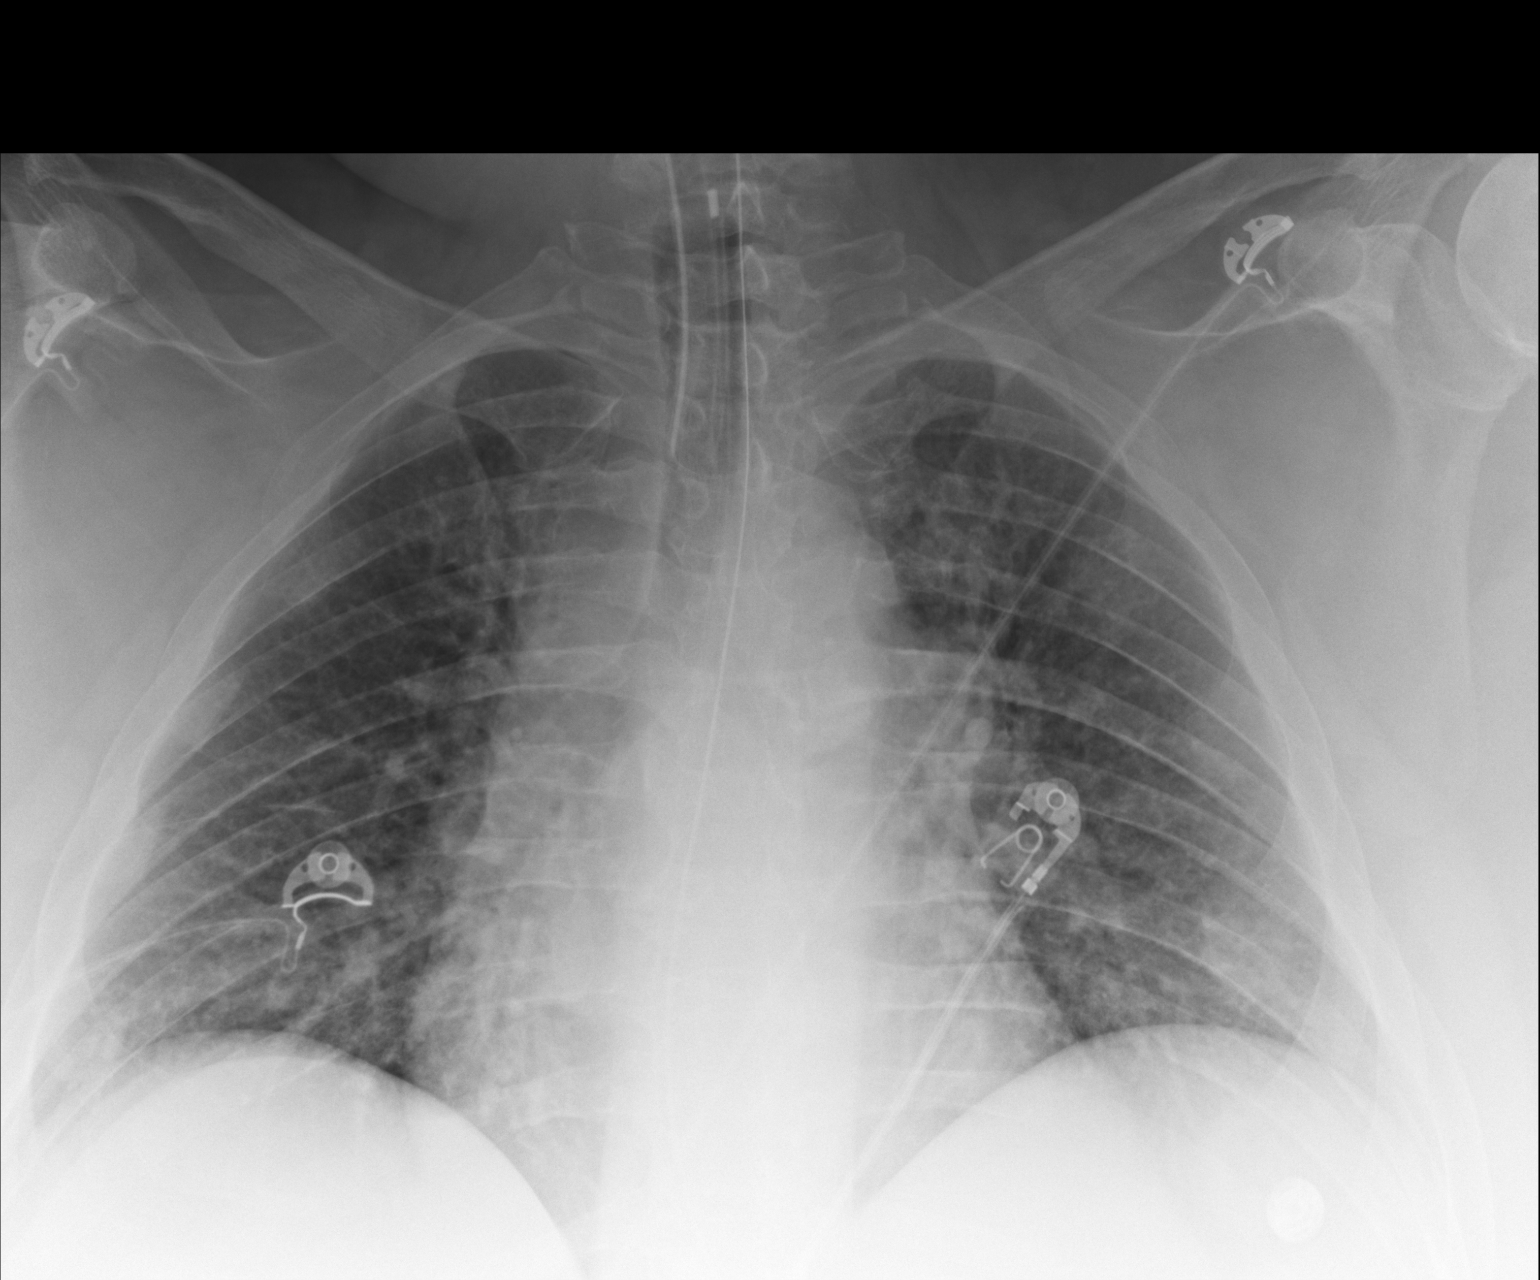

[1 of 1 positions shown; findings below may reference images not displayed]

FINDINGS: Endotracheal tube tip is approximately 5 cm above the carina.
Nasogastric tube is seen entering the stomach.

Increased patchy airspace opacity is seen in the left lung base.
Mild atelectasis or infiltrate also seen in the right lung base,
without significant change. Heart size is within normal limits
allowing for low lung volumes.
IMPRESSION: Endotracheal tube and nasogastric tube in appropriate position.

Increased patchy left lower lobe airspace disease. No significant
change in mild right basilar atelectasis versus infiltrate.

## 2017-10-04 IMAGING — CR DG CHEST 1V PORT
1 series · 1 of 1 positions shown · non-contrast
Comparison: 01/03/2016

CLINICAL DATA: Pulmonary edema

EXAM:
PORTABLE CHEST 1 VIEW

[AP]
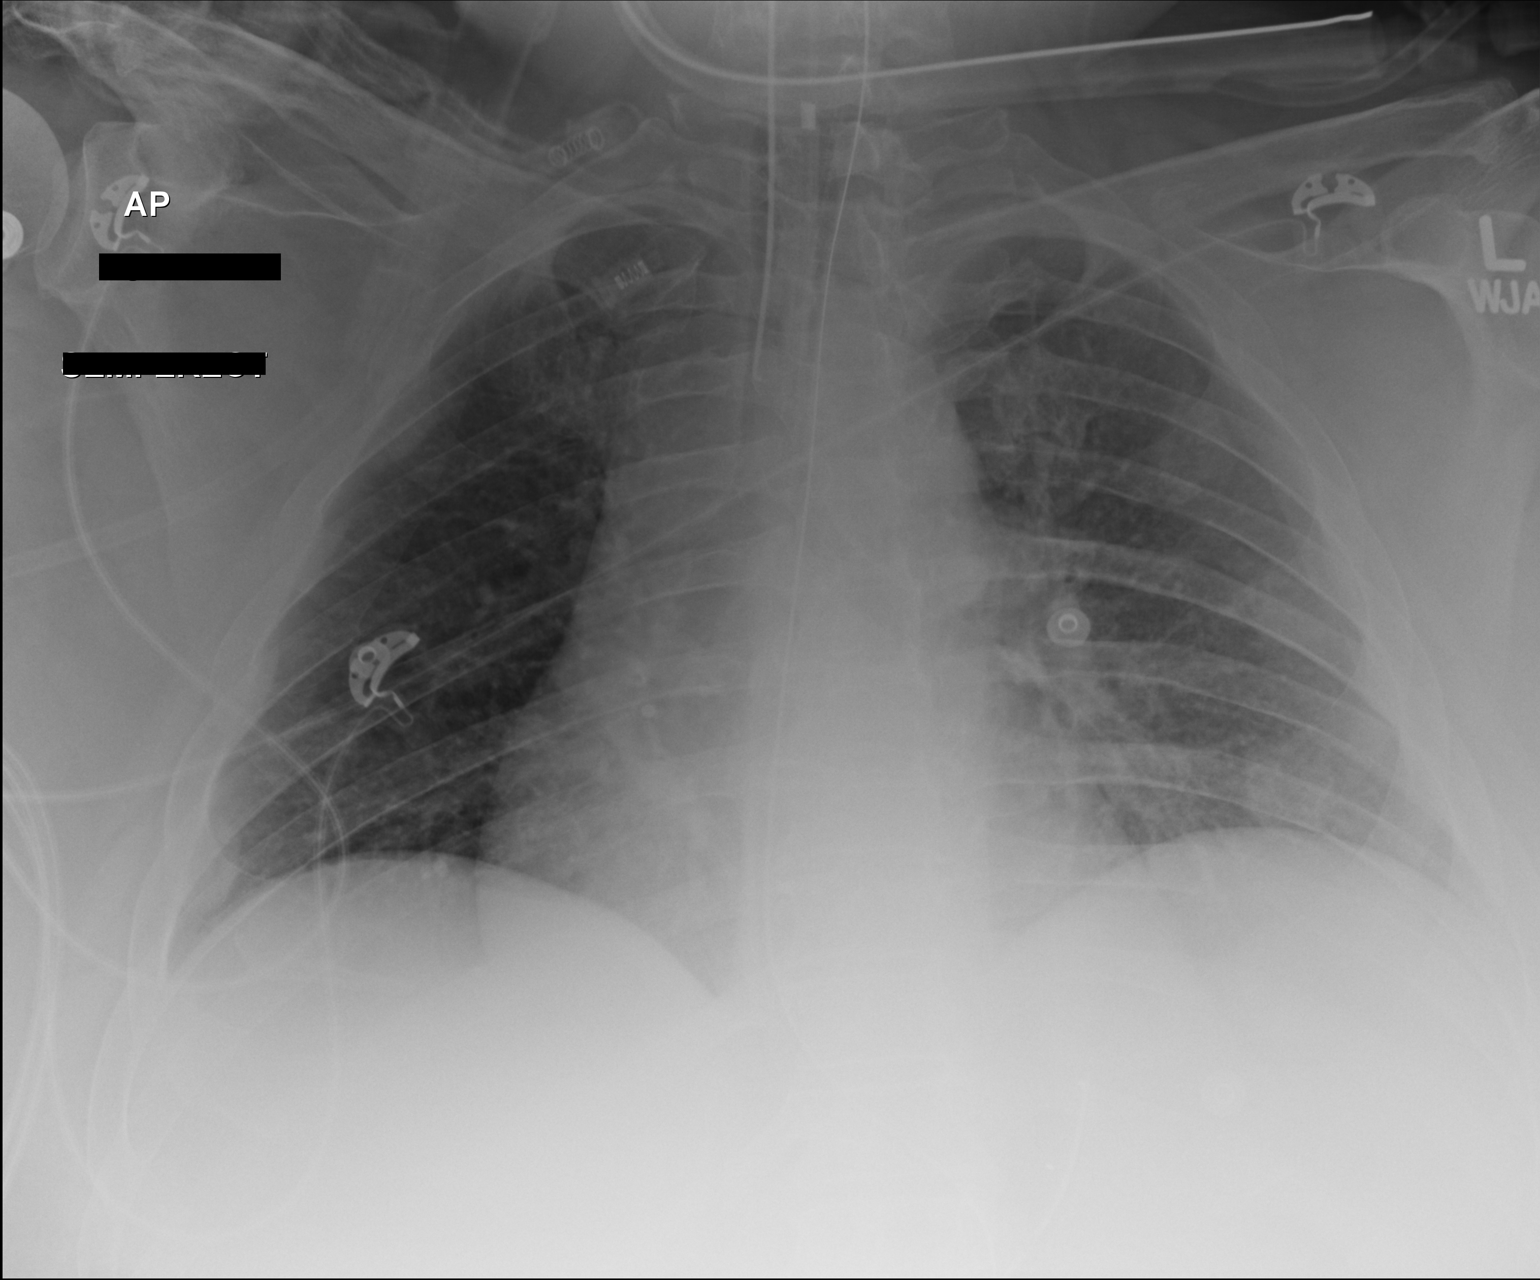

[1 of 1 positions shown; findings below may reference images not displayed]

FINDINGS: Endotracheal tube remains in good position. Gastric tube in the
stomach.

Improved aeration of the bases with decrease in patchy airspace
disease right base since the prior study. Nodular density left lung
base is unchanged most compatible with atelectasis or infiltrate. No
mass was seen on the CT chest of 10/25/2015 in this area. No pleural
effusion.
IMPRESSION: Endotracheal tube in good position

Patchy airspace disease in the right base is improved. Nodular
airspace disease in the left base is unchanged.

## 2017-12-15 ENCOUNTER — Encounter (HOSPITAL_COMMUNITY): Payer: Self-pay | Admitting: Internal Medicine

## 2017-12-15 ENCOUNTER — Observation Stay (HOSPITAL_COMMUNITY)
Admission: EM | Admit: 2017-12-15 | Discharge: 2017-12-17 | Disposition: A | Discharge status: Home / Self Care | Payer: Medicare Other | Attending: Internal Medicine | Admitting: Internal Medicine

## 2017-12-15 ENCOUNTER — Other Ambulatory Visit: Payer: Self-pay

## 2017-12-15 DIAGNOSIS — Z8673 Personal history of transient ischemic attack (TIA), and cerebral infarction without residual deficits: Secondary | ICD-10-CM | POA: Diagnosis not present

## 2017-12-15 DIAGNOSIS — K219 Gastro-esophageal reflux disease without esophagitis: Secondary | ICD-10-CM | POA: Diagnosis not present

## 2017-12-15 DIAGNOSIS — Z9119 Patient's noncompliance with other medical treatment and regimen: Secondary | ICD-10-CM | POA: Diagnosis not present

## 2017-12-15 DIAGNOSIS — Z886 Allergy status to analgesic agent status: Secondary | ICD-10-CM | POA: Insufficient documentation

## 2017-12-15 DIAGNOSIS — F1199 Opioid use, unspecified with unspecified opioid-induced disorder: Secondary | ICD-10-CM | POA: Diagnosis not present

## 2017-12-15 DIAGNOSIS — Z888 Allergy status to other drugs, medicaments and biological substances status: Secondary | ICD-10-CM | POA: Diagnosis not present

## 2017-12-15 DIAGNOSIS — Z6841 Body Mass Index (BMI) 40.0 and over, adult: Secondary | ICD-10-CM | POA: Diagnosis not present

## 2017-12-15 DIAGNOSIS — I10 Essential (primary) hypertension: Secondary | ICD-10-CM | POA: Insufficient documentation

## 2017-12-15 DIAGNOSIS — F419 Anxiety disorder, unspecified: Secondary | ICD-10-CM | POA: Diagnosis not present

## 2017-12-15 DIAGNOSIS — F909 Attention-deficit hyperactivity disorder, unspecified type: Secondary | ICD-10-CM | POA: Diagnosis not present

## 2017-12-15 DIAGNOSIS — E1121 Type 2 diabetes mellitus with diabetic nephropathy: Secondary | ICD-10-CM | POA: Diagnosis not present

## 2017-12-15 DIAGNOSIS — G4733 Obstructive sleep apnea (adult) (pediatric): Secondary | ICD-10-CM | POA: Diagnosis not present

## 2017-12-15 DIAGNOSIS — F319 Bipolar disorder, unspecified: Secondary | ICD-10-CM | POA: Diagnosis not present

## 2017-12-15 DIAGNOSIS — Z79899 Other long term (current) drug therapy: Secondary | ICD-10-CM | POA: Diagnosis not present

## 2017-12-15 DIAGNOSIS — F17210 Nicotine dependence, cigarettes, uncomplicated: Secondary | ICD-10-CM | POA: Diagnosis not present

## 2017-12-15 DIAGNOSIS — T464X5A Adverse effect of angiotensin-converting-enzyme inhibitors, initial encounter: Secondary | ICD-10-CM | POA: Diagnosis not present

## 2017-12-15 DIAGNOSIS — F1120 Opioid dependence, uncomplicated: Secondary | ICD-10-CM | POA: Insufficient documentation

## 2017-12-15 DIAGNOSIS — E785 Hyperlipidemia, unspecified: Secondary | ICD-10-CM | POA: Insufficient documentation

## 2017-12-15 DIAGNOSIS — T783XXA Angioneurotic edema, initial encounter: Principal | ICD-10-CM

## 2017-12-15 DIAGNOSIS — E119 Type 2 diabetes mellitus without complications: Secondary | ICD-10-CM | POA: Insufficient documentation

## 2017-12-15 DIAGNOSIS — Z23 Encounter for immunization: Secondary | ICD-10-CM | POA: Diagnosis not present

## 2017-12-15 DIAGNOSIS — Y92009 Unspecified place in unspecified non-institutional (private) residence as the place of occurrence of the external cause: Secondary | ICD-10-CM | POA: Insufficient documentation

## 2017-12-15 DIAGNOSIS — F1190 Opioid use, unspecified, uncomplicated: Secondary | ICD-10-CM | POA: Diagnosis present

## 2017-12-15 LAB — CBC
HCT: 48 % (ref 39.0–52.0)
Hemoglobin: 16.3 g/dL (ref 13.0–17.0)
MCH: 30.5 pg (ref 26.0–34.0)
MCHC: 34 g/dL (ref 30.0–36.0)
MCV: 89.9 fL (ref 80.0–100.0)
PLATELETS: 241 10*3/uL (ref 150–400)
RBC: 5.34 MIL/uL (ref 4.22–5.81)
RDW: 12.4 % (ref 11.5–15.5)
WBC: 14.9 10*3/uL — ABNORMAL HIGH (ref 4.0–10.5)
nRBC: 0 % (ref 0.0–0.2)

## 2017-12-15 LAB — BASIC METABOLIC PANEL
Anion gap: 11 (ref 5–15)
BUN: 21 mg/dL — AB (ref 6–20)
CALCIUM: 9.1 mg/dL (ref 8.9–10.3)
CO2: 24 mmol/L (ref 22–32)
CREATININE: 1.21 mg/dL (ref 0.61–1.24)
Chloride: 98 mmol/L (ref 98–111)
Glucose, Bld: 135 mg/dL — ABNORMAL HIGH (ref 70–99)
Potassium: 5 mmol/L (ref 3.5–5.1)
Sodium: 133 mmol/L — ABNORMAL LOW (ref 135–145)

## 2017-12-15 MED ORDER — CLONAZEPAM 0.5 MG PO TABS
1.0000 mg | ORAL_TABLET | Freq: Three times a day (TID) | ORAL | Status: DC | PRN
  Administered 2017-12-16 – 2017-12-17 (×2): 1 mg via ORAL
  Filled 2017-12-15 (×2): qty 2

## 2017-12-15 MED ORDER — GABAPENTIN 400 MG PO CAPS
800.0000 mg | ORAL_CAPSULE | Freq: Three times a day (TID) | ORAL | Status: DC
  Administered 2017-12-16 – 2017-12-17 (×5): 800 mg via ORAL
  Filled 2017-12-15 (×5): qty 2

## 2017-12-15 MED ORDER — SODIUM CHLORIDE 0.9% FLUSH
3.0000 mL | Freq: Two times a day (BID) | INTRAVENOUS | Status: DC
  Administered 2017-12-15 – 2017-12-17 (×4): 3 mL via INTRAVENOUS

## 2017-12-15 MED ORDER — INSULIN ASPART 100 UNIT/ML ~~LOC~~ SOLN
0.0000 [IU] | Freq: Three times a day (TID) | SUBCUTANEOUS | Status: DC
  Administered 2017-12-16 (×3): 1 [IU] via SUBCUTANEOUS
  Administered 2017-12-17: 2 [IU] via SUBCUTANEOUS

## 2017-12-15 MED ORDER — SODIUM CHLORIDE 0.9% FLUSH: 3.0000 mL | INTRAVENOUS | Status: DC | PRN

## 2017-12-15 MED ORDER — ATORVASTATIN CALCIUM 20 MG PO TABS
20.0000 mg | ORAL_TABLET | Freq: Every day | ORAL | Status: DC
  Administered 2017-12-16 (×2): 20 mg via ORAL
  Filled 2017-12-15 (×2): qty 1

## 2017-12-15 MED ORDER — ENOXAPARIN SODIUM 80 MG/0.8ML ~~LOC~~ SOLN
80.0000 mg | SUBCUTANEOUS | Status: DC
  Administered 2017-12-15 – 2017-12-16 (×2): 80 mg via SUBCUTANEOUS
  Filled 2017-12-15 (×2): qty 0.8

## 2017-12-15 MED ORDER — FAMOTIDINE IN NACL 20-0.9 MG/50ML-% IV SOLN
20.0000 mg | Freq: Once | INTRAVENOUS | Status: AC
  Administered 2017-12-15: 20 mg via INTRAVENOUS
  Filled 2017-12-15: qty 50

## 2017-12-15 MED ORDER — NAPROXEN 250 MG PO TABS
375.0000 mg | ORAL_TABLET | Freq: Two times a day (BID) | ORAL | Status: DC
  Administered 2017-12-16 – 2017-12-17 (×4): 375 mg via ORAL
  Filled 2017-12-15 (×4): qty 2

## 2017-12-15 MED ORDER — OXYCODONE HCL 5 MG PO TABS
15.0000 mg | ORAL_TABLET | Freq: Four times a day (QID) | ORAL | Status: DC | PRN
  Administered 2017-12-16 – 2017-12-17 (×6): 15 mg via ORAL
  Filled 2017-12-15 (×6): qty 3

## 2017-12-15 MED ORDER — FAMOTIDINE IN NACL 20-0.9 MG/50ML-% IV SOLN
20.0000 mg | Freq: Two times a day (BID) | INTRAVENOUS | Status: DC
  Administered 2017-12-15 – 2017-12-17 (×4): 20 mg via INTRAVENOUS
  Filled 2017-12-15 (×4): qty 50

## 2017-12-15 MED ORDER — POLYETHYLENE GLYCOL 3350 17 GM/SCOOP PO POWD
17.0000 g | Freq: Every day | ORAL | Status: DC
  Filled 2017-12-15: qty 255

## 2017-12-15 MED ORDER — ALBUTEROL SULFATE (2.5 MG/3ML) 0.083% IN NEBU: 2.5000 mg | INHALATION_SOLUTION | RESPIRATORY_TRACT | Status: DC | PRN

## 2017-12-15 MED ORDER — SODIUM CHLORIDE 0.9 % IV SOLN: 250.0000 mL | INTRAVENOUS | Status: DC | PRN

## 2017-12-15 MED ORDER — URSODIOL 300 MG PO CAPS
300.0000 mg | ORAL_CAPSULE | Freq: Two times a day (BID) | ORAL | Status: DC
  Administered 2017-12-16 – 2017-12-17 (×4): 300 mg via ORAL
  Filled 2017-12-15 (×6): qty 1

## 2017-12-15 MED ORDER — PANTOPRAZOLE SODIUM 40 MG PO TBEC
40.0000 mg | DELAYED_RELEASE_TABLET | Freq: Every day | ORAL | Status: DC
  Administered 2017-12-16 – 2017-12-17 (×2): 40 mg via ORAL
  Filled 2017-12-15 (×2): qty 1

## 2017-12-15 MED ORDER — DIPHENHYDRAMINE HCL 50 MG/ML IJ SOLN
12.5000 mg | Freq: Four times a day (QID) | INTRAMUSCULAR | Status: AC
  Administered 2017-12-15 – 2017-12-16 (×3): 12.5 mg via INTRAVENOUS
  Filled 2017-12-15 (×3): qty 1

## 2017-12-15 MED ORDER — DIPHENHYDRAMINE HCL 50 MG/ML IJ SOLN
50.0000 mg | Freq: Once | INTRAMUSCULAR | Status: AC
  Administered 2017-12-15: 50 mg via INTRAVENOUS
  Filled 2017-12-15: qty 1

## 2017-12-15 MED ORDER — TIZANIDINE HCL 2 MG PO TABS
4.0000 mg | ORAL_TABLET | Freq: Three times a day (TID) | ORAL | Status: DC | PRN
  Administered 2017-12-16 – 2017-12-17 (×2): 4 mg via ORAL
  Filled 2017-12-15 (×3): qty 2

## 2017-12-15 MED ORDER — INSULIN ASPART 100 UNIT/ML ~~LOC~~ SOLN: 0.0000 [IU] | Freq: Every day | SUBCUTANEOUS | Status: DC

## 2017-12-15 NOTE — ED Notes (Signed)
This nurse and NT went to obtain EKG and put pt on cardiac monitor and pt refused saying he doesn't want to be on the monitor he just wants the doctor to look at his throat.

## 2017-12-15 NOTE — ED Triage Notes (Signed)
Pt in by [facility] for unresponsive episode at his home.  Pt received intranasal narcan initially by paramedic with some improvement.  Pt given a second dose of intranasal narcan with full awakefullness.  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  Pt recently started Rexulti and family is concerned that pt is having allergic reaction of some kind, states pt's tongue appears swollen.   Pt is awake and alert on arrival.

## 2017-12-15 NOTE — H&P (Addendum)
TRH H&P   Patient Demographics:    Bryan Gallegos, is a 42 y.o. male  MRN: [id number]   DOB - Nov 20, 1975  Admit Date - 12/15/2017  Outpatient Primary MD for the patient is [place], Misty Stanley, [place]  Referring MD/NP/PA:  Idalia Needle  Outpatient Specialists:     Patient coming from: home  Chief Complaint  Patient presents with  . Unresponsive episode      HPI:    Bryan Gallegos  is a 42 y.o. male, w h/o  opioid dep, polysubstance abuse,  bipolar disorder, angioedema (secondary to ACE-I), hypertension, hyperlipidemia, OSA , apparently found unresponsive at home.   Pt denies suicide attempt.  Pt notes that he had some symptoms similary to prior angioedema event where he thought his throat and tongue slightly swollen starting yesterday 12 midnite.    In Ed, ER is concerned that his tongue might be slightly swollen. Pt notes not taking ACE-I anymore. Pt states most recent new drug is rexulti.  Denies rash, sob, cp, palp,  n/v, diarrhea, brbpr.   Na 133, K 5.0, Bun 21, Creatinine 1.21 Wbc 14.9, Hgb 16.3, Plt 241  ED requesting patient be sent to [facility] since previously required intubation and PCCM involvement for angioedema.    Pt states that he is feeling better.  Will admit patient to [facility] since have pulmonary support there at nite.     Review of systems:    In addition to the HPI above,  No Fever-chills, No Headache, No changes with Vision or hearing, No problems swallowing food or Liquids, No Chest pain, No Cough or Shortness of Breath, No Abdominal pain, No Nausea or Vommitting, Bowel movements are regular, No Blood in stool or Urine, No dysuria, No new skin rashes or bruises, No new joints pains-aches,  No new weakness, tingling, numbness in any extremity, No recent weight gain or loss, No polyuria, polydypsia or polyphagia, No significant Mental Stressors.  A  full 10 point Review of Systems was done, except as stated above, all other Review of Systems were negative.   With Past History of the following :    Past Medical History:  Diagnosis Date  . Adult ADHD   . Anaphylactic reaction   . Anxiety   . Asthma   . Bipolar disorder (HCC)   . Complication of anesthesia    Per patient difficult intubation;  . Diabetes mellitus   . Difficult intubation    Per patient  . Drug overdose   . Heart valve disorder    s/p echocardiogram  . Hyperlipidemia   . Hypertension   . Morbidly obese (HCC)   . OSA (obstructive sleep apnea)   . Polysubstance abuse (HCC)   . Transient cerebral ischemia    Unknown      Past Surgical History:  Procedure Laterality Date  . CARPAL TUNNEL RELEASE    . ESOPHAGOGASTRODUODENOSCOPY N/A 04/05/2015  Procedure: ESOPHAGOGASTRODUODENOSCOPY (EGD);  Surgeon: Malissa Hippo, MD;  [facility];  Service: Endoscopy;  Laterality: N/A;  . ESOPHAGOGASTRODUODENOSCOPY (EGD) WITH PROPOFOL N/A 05/21/2015   Procedure: ESOPHAGOGASTRODUODENOSCOPY (EGD) WITH PROPOFOL;  Surgeon: Meryl Dare, MD;  Location: [facility];  Service: Endoscopy;  Laterality: N/A;  . NOSE SURGERY    . TOOTH EXTRACTION    . WISDOM TOOTH EXTRACTION        Social History:     Social History   Tobacco Use  . Smoking status: Current Every Day Smoker    Packs/day: 0.50    Years: 23.00    Pack years: 11.50    Types: Cigarettes  . Smokeless tobacco: Never Used  Substance Use Topics  . Alcohol use: No    Alcohol/week: 0.0 standard drinks     Lives - at home  Mobility - walks by self   Family History :     Family History  Problem Relation Age of Onset  . CAD Mother        Living  . Diabetes Mellitus II Mother   . Stroke Mother   . Hypertension Mother   . Congestive Heart Failure Mother   . Kidney disease Mother   . Fibromyalgia Mother   . Thyroid disease Mother   . Hyperlipidemia Mother   . Liver disease Mother   .  Alcoholism Father 38       Deceased  . Arthritis Maternal Grandmother   . Congestive Heart Failure Maternal Grandmother   . Hypertension Maternal Grandmother   . Lung cancer Maternal Grandfather   . Colon cancer Maternal Aunt   . Stomach cancer Maternal Aunt   . Heart disease Other        Paternal & Maternal  . Crohn's disease Other   . Hypertension Other        Paternal & Maternal  . Hypertension Brother        x3  . Hypertension Sister        #1  . Bipolar disorder Sister        #1  . ADD / ADHD Son        x3  . Bipolar disorder Son        x3  . Asperger's syndrome Son        Home Medications:   Prior to Admission medications   Medication Sig Start Date End Date Taking? Authorizing Provider  albuterol (PROVENTIL HFA;VENTOLIN HFA) 108 (90 Base) MCG/ACT inhaler Inhale 1-2 puffs into the lungs every 6 (six) hours as needed for wheezing or shortness of breath. 02/10/17   Armandina Stammer I, NP  atorvastatin (LIPITOR) 20 MG tablet Take 1 tablet (20 mg total) by mouth at bedtime. For high cholesterol 02/10/17   Nwoko, Nicole Kindred I, NP  clonazePAM (KLONOPIN) 1 MG tablet TAKE ONE TABLET BY MOUTH TWICE DAILY AS NEEDED panic attacks 05/28/17   [provider]  COMBIVENT RESPIMAT 20-100 MCG/ACT AERS respimat Inhale 1 puff into the lungs every 6 (six) hours as needed for wheezing or shortness of breath. 02/10/17   Armandina Stammer I, NP  gabapentin (NEURONTIN) 400 MG capsule Take 2 capsules (800 mg total) by mouth 3 (three) times daily. For agitation/Neuropathy 02/10/17   Armandina Stammer I, NP  hydrOXYzine (ATARAX/VISTARIL) 25 MG tablet Take 1 tablet (25 mg total) by mouth every 6 (six) hours as needed for anxiety. 02/10/17   Armandina Stammer I, NP  naproxen (NAPROSYN) 375 MG tablet Take 1 tablet (375 mg  total) by mouth 2 (two) times daily. 06/27/17   Felicie Morn, NP  omeprazole (PRILOSEC) 40 MG capsule Take 1 capsule (40 mg total) by mouth 2 (two) times daily. For acid reflux 02/10/17   Armandina Stammer  I, NP  oxyCODONE (ROXICODONE) 15 MG immediate release tablet Take 15 mg by mouth every 6 (six) hours as needed. 06/22/17   [provider]  oxyCODONE-acetaminophen (PERCOCET/ROXICET) 5-325 MG tablet Take 1 tablet by mouth 3 (three) times daily as needed. 05/23/17   [provider]  polyethylene glycol powder (MIRALAX) powder Take 17 g by mouth daily. For constipation 02/10/17   Armandina Stammer I, NP  tiZANidine (ZANAFLEX) 4 MG capsule Take 4 mg by mouth 3 (three) times daily as needed for muscle spasms. 04/23/17   [provider]  traZODone (DESYREL) 100 MG tablet TAKE TWO TABLETS BY MOUTH 45 minutes BEFORE bed 05/28/17   [provider]  ursodiol (ACTIGALL) 300 MG capsule Take 1 capsule (300 mg total) by mouth 2 (two) times daily. For kidney stone 02/10/17   Armandina Stammer I, NP  ziprasidone (GEODON) 80 MG capsule TAKE ONE CAPSULE BY MOUTH EVERY evening WITH FOOD 05/28/17   [provider]  lisinopril (PRINIVIL,ZESTRIL) 40 MG tablet Take 1 tablet (40 mg total) by mouth 2 (two) times daily. For high blood pressure 09/05/15 10/01/15  Sanjuana Kava, NP     Allergies:     Allergies  Allergen Reactions  . Ace Inhibitors Anaphylaxis  . Atenolol Anaphylaxis  . Lisinopril Anaphylaxis  . Prednisone Hives  . Ibuprofen Itching, Swelling and Other (See Comments)    Hand/feet swelling   . Tylenol [Acetaminophen] Other (See Comments)    Reaction:  GI bleeding      Physical Exam:   Vitals  Blood pressure (!) 144/92, pulse (!) 119, temperature 98.1 F (36.7 C), temperature source Oral, resp. rate (!) 21, height 6\' 1"  (1.854 m), weight (!) 158.8 kg, SpO2 97 %.   1. General  lying in bed in NAD,    2. Normal affect and insight, Not Suicidal or Homicidal, Awake Alert, Oriented X 3.  3. No F.N deficits, ALL C.Nerves Intact, Strength 5/5 all 4 extremities, Sensation intact all 4 extremities, Plantars down going.  4. Ears and Eyes appear Normal, Conjunctivae clear,  PERRLA.  slightly dry Oral Mucosa , no swelling of lips, tongue slightly prominent but difficult to tell if this is his normal. No swelling of uvula at this point in time. No stridor  5. Supple Neck, No JVD, No cervical lymphadenopathy appriciated, No Carotid Bruits.  6. Symmetrical Chest wall movement, Good air movement bilaterally,  No wheezing no crackles  7. RRR, No Gallops, Rubs or Murmurs, No Parasternal Heave.  8. Positive Bowel Sounds, Abdomen Soft, No tenderness, No organomegaly appriciated,No rebound -guarding or rigidity.  9.  No Cyanosis, Normal Skin Turgor, No Skin Rash or Bruise.  10. Good muscle tone,  joints appear normal , no effusions, Normal ROM.  11. No Palpable Lymph Nodes in Neck or Axillae   Morbidly obese    Data Review:    CBC Recent Labs  Lab 12/15/17 2049  WBC 14.9*  HGB 16.3  HCT 48.0  PLT 241  MCV 89.9  MCH 30.5  MCHC 34.0  RDW 12.4   ------------------------------------------------------------------------------------------------------------------  Chemistries  Recent Labs  Lab 12/15/17 2049  NA 133*  K 5.0  CL 98  CO2 24  GLUCOSE 135*  BUN 21*  CREATININE 1.21  CALCIUM 9.1   ------------------------------------------------------------------------------------------------------------------  estimated creatinine clearance is 124.1 mL/min (by C-G formula based on SCr of 1.21 mg/dL). ------------------------------------------------------------------------------------------------------------------ No results for input(s): TSH, T4TOTAL, T3FREE, THYROIDAB in the last 72 hours.  Invalid input(s): FREET3  Coagulation profile No results for input(s): INR, PROTIME in the last 168 hours. ------------------------------------------------------------------------------------------------------------------- No results for input(s): DDIMER in the last 72  hours. -------------------------------------------------------------------------------------------------------------------  Cardiac Enzymes No results for input(s): CKMB, TROPONINI, MYOGLOBIN in the last 168 hours.  Invalid input(s): CK ------------------------------------------------------------------------------------------------------------------    Component Value Date/Time   BNP 16.2 06/19/2016 0133     ---------------------------------------------------------------------------------------------------------------  Urinalysis    Component Value Date/Time   COLORURINE YELLOW 05/25/2017 1457   APPEARANCEUR CLEAR 05/25/2017 1457   LABSPEC 1.010 05/25/2017 1457   PHURINE 6.5 05/25/2017 1457   GLUCOSEU NEGATIVE 05/25/2017 1457   HGBUR NEGATIVE 05/25/2017 1457   BILIRUBINUR NEGATIVE 05/25/2017 1457   KETONESUR NEGATIVE 05/25/2017 1457   PROTEINUR NEGATIVE 05/25/2017 1457   UROBILINOGEN 1.0 08/18/2014 2002   NITRITE NEGATIVE 05/25/2017 1457   LEUKOCYTESUR NEGATIVE 05/25/2017 1457    ----------------------------------------------------------------------------------------------------------------   Imaging Results:    No results found.     Assessment & Plan:    Principal Problem:   Angioedema Active Problems:   Diabetes mellitus type II, controlled (HCC)   Opioid use disorder (HCC)   ? Allerghic reaction vs angioedema STOP Rexulti pepcid 20mg  iv bid Benadryl 12.5mg  iv q6h Pt states allerghic to prednisone , he declines iv steroid  Monitor closely in Stepdown bed  OSA noncompliant with CPAP CPAP  Anxiety/ Bipolar disorder Clonazepam 1mg  po tid (pt states increased )  Opioid use/ h/o polysubstance abuse Cont oxycodone 15mg  po qid Check UDS  Asthma Albuterol neb q6h prn  Dm2 fsbs ac and qhs, ISS  GERD Cont PPI  H/o gallstones Cont Ursodiol  Chronic pain Cont Gabapentin Cont Tizanidine     DVT Prophylaxis   Lovenox - SCDs   AM Labs Ordered,  also please review Full Orders  Family Communication: Admission, patients condition and plan of care including tests being ordered have been discussed with the patient who indicate understanding and agree with the plan and Code Status.  Code Status  FULL CODE  Likely DC to  home  Condition GUARDED    Consults called: none  Admission status: observation , pt will require iv pepcid for allerghic reaction vs angioedema.  Pt appears to be improving so admitted observation however if condition decompensates may need inpatient stay.    Time spent in minutes : 70   Pearson Grippe M.D on 12/15/2017 at 10:05 PM  Between 7am to 7pm - Pager - [phone]  . After 7pm go to [email or web address] - password [facility]  [facility] - Office  [phone]

## 2017-12-15 NOTE — ED Provider Notes (Signed)
[facility] EMERGENCY DEPARTMENT Provider Note   CSN: [id number] Arrival date & time: 12/15/17  1904     History   Chief Complaint Chief Complaint  Patient presents with  . Unresponsive episode    HPI Bryan Gallegos is a 42 y.o. male.  Patient with a history of angioedema secondary to ACE inhibitors in the past last episode was 2017.  Patient currently not on any ACE inhibitors at all.  Did start a new medication Rexulti yesterday.  This is his only new medication.  Patient feels that symptoms started around midnight last night were more evident by 6 this morning.  This is very similar to what happened in the past he feels as if the uvula in the back of his throat is swollen.  And that his tongue is swollen.  Patient able to verbalize fine and swallow his secretions fine.  Denies any wheezing denies any rash.  Patient states that he cannot take prednisone it makes his swelling worse.  Patient did not take any medications at home.  EMS reported that patient was somewhat unresponsive he was given intranasal Narcan not clear whether patient had some bit of a hypoxic episode or whether it was related to this.  The patient is now been observed here for period of time and has not required any more Narcan.  Patient does have a past history of polysubstance abuse.     Past Medical History:  Diagnosis Date  . Adult ADHD   . Anaphylactic reaction   . Anxiety   . Asthma   . Bipolar disorder (HCC)   . Complication of anesthesia    Per patient difficult intubation;  . Diabetes mellitus   . Difficult intubation    Per patient  . Drug overdose   . Heart valve disorder    s/p echocardiogram  . Hyperlipidemia   . Hypertension   . Morbidly obese (HCC)   . OSA (obstructive sleep apnea)   . Polysubstance abuse (HCC)   . Transient cerebral ischemia    Unknown    Patient Active Problem List   Diagnosis Date Noted  . Swelling of right hand 06/27/2017  . Bipolar affective disorder  (HCC)   . Vertebral osteomyelitis (HCC) 05/01/2017  . 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 Opioid use disorder (HCC)   . Hepatitis C infection   . History of suicide attempt 10/17/2016  . Chronic pain syndrome 10/22/2015  . Polysubstance dependence including opioid type drug, episodic abuse (HCC) 09/04/2015  . Cholelithiasis 05/17/2015  . Antisocial personality disorder (HCC) 04/11/2015  . Benzodiazepine dependence (HCC) 04/11/2015  . Tobacco use disorder 04/10/2015  . Gastroesophageal reflux disease with esophagitis 01/28/2014  . Diabetes mellitus type II, controlled (HCC) 01/28/2014  . Morbid obesity (HCC) 01/28/2014  . TIA (transient ischemic attack) 09/02/2013  . HLD (hyperlipidemia) 08/06/2013  . OSA on CPAP 08/06/2013    Past Surgical History:  Procedure Laterality Date  . CARPAL TUNNEL RELEASE    . ESOPHAGOGASTRODUODENOSCOPY N/A 04/05/2015   Procedure: ESOPHAGOGASTRODUODENOSCOPY (EGD);  Surgeon: Malissa Hippo, MD;  [facility];  Service: Endoscopy;  Laterality: N/A;  . ESOPHAGOGASTRODUODENOSCOPY (EGD) WITH PROPOFOL N/A 05/21/2015   Procedure: ESOPHAGOGASTRODUODENOSCOPY (EGD) WITH PROPOFOL;  Surgeon: Meryl Dare, MD;  Location: [facility];  Service: Endoscopy;  Laterality: N/A;  . NOSE SURGERY    . TOOTH EXTRACTION    . WISDOM TOOTH EXTRACTION          Home  Medications    Prior to Admission medications   Medication Sig Start Date End Date Taking? Authorizing Provider  albuterol (PROVENTIL HFA;VENTOLIN HFA) 108 (90 Base) MCG/ACT inhaler Inhale 1-2 puffs into the lungs every 6 (six) hours as needed for wheezing or shortness of breath. 02/10/17   Armandina Stammer I, NP  atorvastatin (LIPITOR) 20 MG tablet Take 1 tablet (20 mg total) by mouth at bedtime. For high cholesterol 02/10/17   Nwoko, Nicole Kindred I, NP  clonazePAM (KLONOPIN) 1 MG tablet TAKE ONE TABLET BY MOUTH TWICE DAILY AS NEEDED panic attacks 05/28/17    [provider]  COMBIVENT RESPIMAT 20-100 MCG/ACT AERS respimat Inhale 1 puff into the lungs every 6 (six) hours as needed for wheezing or shortness of breath. 02/10/17   Armandina Stammer I, NP  gabapentin (NEURONTIN) 400 MG capsule Take 2 capsules (800 mg total) by mouth 3 (three) times daily. For agitation/Neuropathy 02/10/17   Armandina Stammer I, NP  hydrOXYzine (ATARAX/VISTARIL) 25 MG tablet Take 1 tablet (25 mg total) by mouth every 6 (six) hours as needed for anxiety. 02/10/17   Armandina Stammer I, NP  naproxen (NAPROSYN) 375 MG tablet Take 1 tablet (375 mg total) by mouth 2 (two) times daily. 06/27/17   Felicie Morn, NP  omeprazole (PRILOSEC) 40 MG capsule Take 1 capsule (40 mg total) by mouth 2 (two) times daily. For acid reflux 02/10/17   Armandina Stammer I, NP  oxyCODONE (ROXICODONE) 15 MG immediate release tablet Take 15 mg by mouth every 6 (six) hours as needed. 06/22/17   [provider]  oxyCODONE-acetaminophen (PERCOCET/ROXICET) 5-325 MG tablet Take 1 tablet by mouth 3 (three) times daily as needed. 05/23/17   [provider]  polyethylene glycol powder (MIRALAX) powder Take 17 g by mouth daily. For constipation 02/10/17   Armandina Stammer I, NP  tiZANidine (ZANAFLEX) 4 MG capsule Take 4 mg by mouth 3 (three) times daily as needed for muscle spasms. 04/23/17   [provider]  traZODone (DESYREL) 100 MG tablet TAKE TWO TABLETS BY MOUTH 45 minutes BEFORE bed 05/28/17   [provider]  ursodiol (ACTIGALL) 300 MG capsule Take 1 capsule (300 mg total) by mouth 2 (two) times daily. For kidney stone 02/10/17   Armandina Stammer I, NP  ziprasidone (GEODON) 80 MG capsule TAKE ONE CAPSULE BY MOUTH EVERY evening WITH FOOD 05/28/17   [provider]  lisinopril (PRINIVIL,ZESTRIL) 40 MG tablet Take 1 tablet (40 mg total) by mouth 2 (two) times daily. For high blood pressure 09/05/15 10/01/15  Sanjuana Kava, NP    Family History Family History  Problem Relation Age of Onset    . CAD Mother        Living  . Diabetes Mellitus II Mother   . Stroke Mother   . Hypertension Mother   . Congestive Heart Failure Mother   . Kidney disease Mother   . Fibromyalgia Mother   . Thyroid disease Mother   . Hyperlipidemia Mother   . Liver disease Mother   . Alcoholism Father 39       Deceased  . Arthritis Maternal Grandmother   . Congestive Heart Failure Maternal Grandmother   . Hypertension Maternal Grandmother   . Lung cancer Maternal Grandfather   . Colon cancer Maternal Aunt   . Stomach cancer Maternal Aunt   . Heart disease Other        Paternal & Maternal  . Crohn's disease Other   . Hypertension Other  Paternal & Maternal  . Hypertension Brother        x3  . Hypertension Sister        #1  . Bipolar disorder Sister        #1  . ADD / ADHD Son        x3  . Bipolar disorder Son        x3  . Asperger's syndrome Son     Social History Social History   Tobacco Use  . Smoking status: Current Every Day Smoker    Packs/day: 0.50    Years: 23.00    Pack years: 11.50    Types: Cigarettes  . Smokeless tobacco: Never Used  Substance Use Topics  . Alcohol use: No    Alcohol/week: 0.0 standard drinks  . Drug use: Not Currently    Types: Benzodiazepines, Heroin     Allergies   Ace inhibitors; Atenolol; Lisinopril; Prednisone; Ibuprofen; and Tylenol [acetaminophen]   Review of Systems Review of Systems  Constitutional: Negative for fever.  HENT: Positive for trouble swallowing. Negative for congestion and sore throat.   Eyes: Negative for redness.  Respiratory: Negative for shortness of breath.   Cardiovascular: Negative for chest pain.  Gastrointestinal: Negative for abdominal pain.  Genitourinary: Negative for dysuria.  Musculoskeletal: Negative for back pain.  Skin: Negative for rash.  Neurological: Negative for headaches.  Hematological: Does not bruise/bleed easily.  Psychiatric/Behavioral: Negative for confusion.     Physical  Exam Updated Vital Signs BP (!) 144/92   Pulse (!) 119   Temp 98.1 F (36.7 C) (Oral)   Resp (!) 21   Ht 1.854 m (6\' 1" )   Wt (!) 158.8 kg   SpO2 97%   BMI 46.18 kg/m   Physical Exam  Constitutional: He is oriented to person, place, and time. He appears well-developed and well-nourished. No distress.  HENT:  Head: Normocephalic and atraumatic.  Mouth/Throat: Oropharynx is clear and moist.  Some tongue swelling.  Patient able to put the tongue on the roof of the mouth floor the mouth is not swollen.  Uvula swollen.  No significant swelling in the neck area.   Eyes: Pupils are equal, round, and reactive to light. Conjunctivae and EOM are normal.  Neck: Neck supple.  Cardiovascular: Normal rate, regular rhythm and normal heart sounds.  Pulmonary/Chest: Effort normal and breath sounds normal. No respiratory distress. He has no wheezes.  Abdominal: Soft. Bowel sounds are normal. There is no tenderness.  Musculoskeletal: Normal range of motion.  Neurological: He is alert and oriented to person, place, and time. No cranial nerve deficit or sensory deficit. He exhibits normal muscle tone. Coordination normal.  Skin: Skin is warm. No rash noted.  Nursing note and vitals reviewed.    ED Treatments / Results  Labs (all labs ordered are listed, but only abnormal results are displayed) Labs Reviewed  BASIC METABOLIC PANEL - Abnormal; Notable for the following components:      Result Value   Sodium 133 (*)    Glucose, Bld 135 (*)    BUN 21 (*)    All other components within normal limits  CBC - Abnormal; Notable for the following components:   WBC 14.9 (*)    All other components within normal limits    EKG None  Radiology No results found.  Procedures Procedures (including critical care time)  Medications Ordered in ED Medications  diphenhydrAMINE (BENADRYL) injection 50 mg (50 mg Intravenous Given 12/15/17 2024)  famotidine (PEPCID) IVPB 20  mg premix ( Intravenous  Stopped 12/15/17 2057)     Initial Impression / Assessment and Plan / ED Course  I have reviewed the triage vital signs and the nursing notes.  Pertinent labs & imaging results that were available during my care of the patient were reviewed by me and considered in my medical decision making (see chart for details).    Patient most likely with an allergic reaction to the new medication Rexulti.  Patient with some swelling of the tongue with no swelling to the floor mouth uvula swollen.  Patient not on ACE inhibitor so I do not think it is a nonallergic angioedema.  I think it is related to that.  Symptom onset sometime between midnight and 6 this morning so either way had symptoms now for 12 hours and has remained stable.  No worsening of symptoms.  Patient states that steroids make his tongue swelling worse so we held off on that he received Pepcid and Benadryl IV.  Oxygen saturations have been good patient maintaining airway fine no worsening at all maintaining secretions fine.  Discussion with hospitalist here will admit him to the hospitalist service at [facility] for observation would recommend continuing the Benadryl IV every 6 hours and the Pepcid every 12.  Transfer is so the patient could be somewhere where difficult airway could be better dealt with if he gets worse.  But patient is now been very stable for a significant period of time.   Final Clinical Impressions(s) / ED Diagnoses   Final diagnoses:  Angioedema, initial encounter    ED Discharge Orders    None       Vanetta Mulders, MD 12/15/17 2222

## 2017-12-16 ENCOUNTER — Observation Stay (HOSPITAL_COMMUNITY): Payer: Medicare Other

## 2017-12-16 DIAGNOSIS — T464X5A Adverse effect of angiotensin-converting-enzyme inhibitors, initial encounter: Secondary | ICD-10-CM | POA: Diagnosis not present

## 2017-12-16 DIAGNOSIS — Y92009 Unspecified place in unspecified non-institutional (private) residence as the place of occurrence of the external cause: Secondary | ICD-10-CM | POA: Diagnosis not present

## 2017-12-16 DIAGNOSIS — T783XXA Angioneurotic edema, initial encounter: Secondary | ICD-10-CM | POA: Diagnosis present

## 2017-12-16 DIAGNOSIS — F1120 Opioid dependence, uncomplicated: Secondary | ICD-10-CM | POA: Diagnosis not present

## 2017-12-16 DIAGNOSIS — Z8673 Personal history of transient ischemic attack (TIA), and cerebral infarction without residual deficits: Secondary | ICD-10-CM | POA: Diagnosis not present

## 2017-12-16 DIAGNOSIS — F319 Bipolar disorder, unspecified: Secondary | ICD-10-CM | POA: Diagnosis not present

## 2017-12-16 DIAGNOSIS — I10 Essential (primary) hypertension: Secondary | ICD-10-CM | POA: Diagnosis not present

## 2017-12-16 DIAGNOSIS — F17210 Nicotine dependence, cigarettes, uncomplicated: Secondary | ICD-10-CM | POA: Diagnosis not present

## 2017-12-16 DIAGNOSIS — Z888 Allergy status to other drugs, medicaments and biological substances status: Secondary | ICD-10-CM | POA: Diagnosis not present

## 2017-12-16 DIAGNOSIS — E119 Type 2 diabetes mellitus without complications: Secondary | ICD-10-CM | POA: Diagnosis not present

## 2017-12-16 DIAGNOSIS — Z886 Allergy status to analgesic agent status: Secondary | ICD-10-CM | POA: Diagnosis not present

## 2017-12-16 DIAGNOSIS — F909 Attention-deficit hyperactivity disorder, unspecified type: Secondary | ICD-10-CM | POA: Diagnosis not present

## 2017-12-16 DIAGNOSIS — Z9119 Patient's noncompliance with other medical treatment and regimen: Secondary | ICD-10-CM | POA: Diagnosis not present

## 2017-12-16 DIAGNOSIS — Z6841 Body Mass Index (BMI) 40.0 and over, adult: Secondary | ICD-10-CM | POA: Diagnosis not present

## 2017-12-16 DIAGNOSIS — Z79899 Other long term (current) drug therapy: Secondary | ICD-10-CM | POA: Diagnosis not present

## 2017-12-16 DIAGNOSIS — Z23 Encounter for immunization: Secondary | ICD-10-CM | POA: Diagnosis not present

## 2017-12-16 DIAGNOSIS — E785 Hyperlipidemia, unspecified: Secondary | ICD-10-CM | POA: Diagnosis not present

## 2017-12-16 DIAGNOSIS — F419 Anxiety disorder, unspecified: Secondary | ICD-10-CM | POA: Diagnosis not present

## 2017-12-16 DIAGNOSIS — G4733 Obstructive sleep apnea (adult) (pediatric): Secondary | ICD-10-CM | POA: Diagnosis not present

## 2017-12-16 DIAGNOSIS — K219 Gastro-esophageal reflux disease without esophagitis: Secondary | ICD-10-CM | POA: Diagnosis not present

## 2017-12-16 LAB — RAPID URINE DRUG SCREEN, HOSP PERFORMED
Amphetamines: POSITIVE — AB
BENZODIAZEPINES: POSITIVE — AB
Barbiturates: NOT DETECTED
COCAINE: NOT DETECTED
OPIATES: POSITIVE — AB
Tetrahydrocannabinol: NOT DETECTED

## 2017-12-16 LAB — GLUCOSE, CAPILLARY
GLUCOSE-CAPILLARY: 145 mg/dL — AB (ref 70–99)
GLUCOSE-CAPILLARY: 133 mg/dL — AB (ref 70–99)
Glucose-Capillary: 150 mg/dL — ABNORMAL HIGH (ref 70–99)
Glucose-Capillary: 126 mg/dL — ABNORMAL HIGH (ref 70–99)
Glucose-Capillary: 134 mg/dL — ABNORMAL HIGH (ref 70–99)

## 2017-12-16 LAB — MRSA PCR SCREENING: MRSA BY PCR: NEGATIVE

## 2017-12-16 LAB — CBG MONITORING, ED: Glucose-Capillary: 184 mg/dL — ABNORMAL HIGH (ref 70–99)

## 2017-12-16 MED ORDER — PNEUMOCOCCAL VAC POLYVALENT 25 MCG/0.5ML IJ INJ
0.5000 mL | INJECTION | INTRAMUSCULAR | Status: AC
  Administered 2017-12-17: 0.5 mL via INTRAMUSCULAR
  Filled 2017-12-16: qty 0.5

## 2017-12-16 MED ORDER — POLYETHYLENE GLYCOL 3350 17 G PO PACK
17.0000 g | PACK | Freq: Every day | ORAL | Status: DC
  Filled 2017-12-16 (×2): qty 1

## 2017-12-16 MED ORDER — INFLUENZA VAC SPLIT QUAD 0.5 ML IM SUSY
0.5000 mL | PREFILLED_SYRINGE | INTRAMUSCULAR | Status: AC
  Administered 2017-12-17: 0.5 mL via INTRAMUSCULAR
  Filled 2017-12-16: qty 0.5

## 2017-12-16 NOTE — Progress Notes (Signed)
PROGRESS NOTE    Bryan Gallegos  WUJ:[id number] DOB: 12/25/1975 DOA: 12/15/2017 PCP: Neldon Labella, PA   Brief Narrative:  42 year old with past medical history relevant for ACE inhibitor induced angioedema, hypertension, hyperlipidemia, obstructive sleep apnea, class III obesity, bipolar disorder, polysubstance abuse who was admitted for "unresponsive episode" which he reported was similar to his prior episode of angioedema and some tongue swelling.   Assessment & Plan:   Principal Problem:   Angioedema Active Problems:   Diabetes mellitus type II, controlled (HCC)   Opioid use disorder (HCC)   #) Unresponsive episode: Suspect this was most likely secondary to multiple medications the patient is on including benzodiazepines, opiates, multiple antipsychotics.  Have low suspicion for patient having any significant respiratory embarrassment at this time as his saturations are excellent, he is not in distress.  #)  history of angioedema: Patient currently had a history of angioedema secondary to ACE inhibitors.  He repaired reportedly has recently started a new antipsychotic Rexulti which he attributes his current episode of angioedema 4. -Status post famotidine, diphenhydramine -Patient reports allergy to steroids -Noncontrast next CT pending  #) Bipolar disorder/psych/pain: -Continue clonazepam 1 mg 3 times daily as needed -Continue gabapentin 800 mg 3 times daily -Continue hydroxyzine 25 mg every 6 hours -Continue oxycodone 15 mg every 6 hours as needed -Continue tizanidine 3 mg 3 times daily -Continue trazodone 100 mg nightly - Hold ziprasidone 80 mg daily  #)  hyperlipidemia: -Continue atorvastatin 20 mg daily  #) History of nephrolithiasis: -Continue ursodiol 300 mg twice daily  Fluids: Tolerating p.o. Electro lites: Monitor and supplement Nutrition: Heart healthy diet next Prophylaxis: Enoxaparin  Disposition: Pending resolution of symptoms and CT  scan  Full code  Consultants:   None  Procedures:   None  Antimicrobials:   None   Subjective: Patient reports that he continues to have throat swelling.  He also reports some trouble breathing.  He denies any nausea, vomiting, diarrhea, cough, congestion.  Objective: Vitals:   12/15/17 1930 12/16/17 0028 12/16/17 0143 12/16/17 0459  BP: (!) 144/92 121/83 (!) 129/101 (!) 141/101  Pulse: (!) 119 (!) 102 (!) 111 86  Resp:  20  20  Temp:  98 F (36.7 C) 98.2 F (36.8 C) 97.9 F (36.6 C)  TempSrc:  Oral Oral Oral  SpO2: 97% 99% 94% 94%  Weight:   (!) 175 kg   Height:   6\' 2"  (1.88 m)     Intake/Output Summary (Last 24 hours) at 12/16/2017 1517 Last data filed at 12/16/2017 0200 Gross per 24 hour  Intake 812.15 ml  Output 125 ml  Net 687.15 ml   Filed Weights   12/15/17 1911 12/16/17 0143  Weight: (!) 158.8 kg (!) 175 kg    Examination:  General exam: Appears calm and comfortable  Respiratory system: Clear to auscultation. Respiratory effort normal.  Pickwickian neck, no edema of the throat or larynx noted Cardiovascular system: Regular rate and rhythm, distant heart sounds, no murmurs Gastrointestinal system: Abdomen is nondistended, soft and nontender. No organomegaly or masses felt. Normal bowel sounds heard. Central nervous system: Alert and oriented. No focal neurological deficits. Extremities: Trace lower extremity edema Skin: No rashes over visible skin Psychiatry: Judgement and insight appear normal. Mood & affect appropriate.     Data Reviewed: I have personally reviewed following labs and imaging studies  CBC: Recent Labs  Lab 12/15/17 2049  WBC 14.9*  HGB 16.3  HCT 48.0  MCV 89.9  PLT 241  Basic Metabolic Panel: Recent Labs  Lab 12/15/17 2049  NA 133*  K 5.0  CL 98  CO2 24  GLUCOSE 135*  BUN 21*  CREATININE 1.21  CALCIUM 9.1   GFR: Estimated Creatinine Clearance: 132.8 mL/min (by C-G formula based on SCr of 1.21  mg/dL). Liver Function Tests: No results for input(s): AST, ALT, ALKPHOS, BILITOT, PROT, ALBUMIN in the last 168 hours. No results for input(s): LIPASE, AMYLASE in the last 168 hours. No results for input(s): AMMONIA in the last 168 hours. Coagulation Profile: No results for input(s): INR, PROTIME in the last 168 hours. Cardiac Enzymes: No results for input(s): CKTOTAL, CKMB, CKMBINDEX, TROPONINI in the last 168 hours. BNP (last 3 results) No results for input(s): PROBNP in the last 8760 hours. HbA1C: No results for input(s): HGBA1C in the last 72 hours. CBG: Recent Labs  Lab 12/16/17 0026 12/16/17 0157 12/16/17 0627 12/16/17 1118  GLUCAP 184* 145* 150* 126*   Lipid Profile: No results for input(s): CHOL, HDL, LDLCALC, TRIG, CHOLHDL, LDLDIRECT in the last 72 hours. Thyroid Function Tests: No results for input(s): TSH, T4TOTAL, FREET4, T3FREE, THYROIDAB in the last 72 hours. Anemia Panel: No results for input(s): VITAMINB12, FOLATE, FERRITIN, TIBC, IRON, RETICCTPCT in the last 72 hours. Sepsis Labs: No results for input(s): PROCALCITON, LATICACIDVEN in the last 168 hours.  Recent Results (from the past 240 hour(s))  MRSA PCR Screening     Status: None   Collection Time: 12/16/17  2:24 AM  Result Value Ref Range Status   MRSA by PCR NEGATIVE NEGATIVE Final    Comment:        The GeneXpert MRSA Assay (FDA approved for NASAL specimens only), is one component of a comprehensive MRSA colonization surveillance program. It is not intended to diagnose MRSA infection nor to guide or monitor treatment for MRSA infections. Performed at [facility], [address] [place], [place] [address]          Radiology Studies: Ct Soft Tissue Neck Wo Contrast  Result Date: 12/16/2017 CLINICAL DATA:  42 year old male with sore throat and tongue swelling after beginning a new medication recently. EXAM: CT NECK WITHOUT CONTRAST TECHNIQUE: Multidetector CT imaging of the neck  was performed following the standard protocol without intravenous contrast. COMPARISON:  Cervical spine CT 06/27/2017.  Neck CT 10/04/2015. FINDINGS: Pharynx and larynx: Large body habitus with streak artifact at the thoracic inlet. Motion artifact at the larynx and hypopharynx. The visible epiglottis is nonthickened. No laryngeal or hypopharyngeal edema is evident. Motion artifact continues to the soft palate. The nasopharynx appears stable since 2017. Negative parapharyngeal spaces. Grossly negative retropharyngeal space. No oral tongue abnormality is evident. Salivary glands: Sublingual space and submandibular glands appear stable and negative allowing for some motion. Stable and negative parotid glands. Thyroid: Negative. Lymph nodes: Bilateral cervical lymph nodes are stable since 2017 and normal. Vascular: Vascular patency is not evaluated in the absence of IV contrast. Limited intracranial: Negative. Visualized orbits: Negative. Mastoids and visualized paranasal sinuses: Sphenoid sinus disease seen in 2017 has resolved. There is mild moderate new mucosal thickening in the right maxillary sinus now. The other visible paranasal sinuses are clear. There are mild chronic mastoid effusions. Tympanic cavities remain clear. Skeleton: Chronic right lamina papyracea fracture. Carious bilateral mandible anterior molars. No acute osseous abnormality identified. Upper chest: Negative visible lung apices. IMPRESSION: 1. Suboptimal due to pharyngeal motion artifact and lack of IV contrast but no pharyngeal or laryngeal edema is evident. There is no lymphadenopathy  or inflammation identified in the neck. 2. Mild to moderate right maxillary sinus mucosal thickening. 3. Carious mandible anterior molars. Electronically Signed   By: Odessa Fleming M.D.   On: 12/16/2017 12:30        Scheduled Meds: . atorvastatin  20 mg Oral QHS  . enoxaparin (LOVENOX) injection  80 mg Subcutaneous Q24H  . gabapentin  800 mg Oral TID  .  [START ON 12/17/2017] Influenza vac split quadrivalent PF  0.5 mL Intramuscular Tomorrow-1000  . insulin aspart  0-5 Units Subcutaneous QHS  . insulin aspart  0-9 Units Subcutaneous TID WC  . naproxen  375 mg Oral BID  . pantoprazole  40 mg Oral Daily  . [START ON 12/17/2017] pneumococcal 23 valent vaccine  0.5 mL Intramuscular Tomorrow-1000  . polyethylene glycol  17 g Oral Daily  . sodium chloride flush  3 mL Intravenous Q12H  . ursodiol  300 mg Oral BID   Continuous Infusions: . sodium chloride    . famotidine (PEPCID) IV 20 mg (12/16/17 1018)     LOS: 0 days    Time spent: 35    Delaine Lame, MD [facility]  If 7PM-7AM, please contact night-coverage [email or web address] Password [email or web address] 12/16/2017, 3:17 PM

## 2017-12-16 NOTE — Progress Notes (Signed)
RT NOTE:  Pt refuses CPAP tonight. He does not wear @ home. RT available if needed.

## 2017-12-17 DIAGNOSIS — T783XXA Angioneurotic edema, initial encounter: Secondary | ICD-10-CM | POA: Diagnosis not present

## 2017-12-17 LAB — GLUCOSE, CAPILLARY
GLUCOSE-CAPILLARY: 168 mg/dL — AB (ref 70–99)
GLUCOSE-CAPILLARY: 109 mg/dL — AB (ref 70–99)

## 2017-12-17 NOTE — Discharge Summary (Signed)
Physician Discharge Summary  Bryan Gallegos:[id number] DOB: 26-Jul-1975 DOA: 12/15/2017  PCP: Neldon Labella, PA  Admit date: 12/15/2017 Discharge date: 12/17/2017  Admitted From: Home Disposition:  Home  Recommendations for Outpatient Follow-up:  1. Follow up with PCP in 1-2 weeks 2. Please obtain BMP/CBC in one week  Home Health: None Equipment/Devices:None  Discharge Condition: stable CODE STATUS: FULL Diet recommendation: Heart Healthy   Brief/Interim Summary:  #) Unresponsive episode: Patient was initially admitted to the ED with an episode of unresponsiveness that he attributed to development of angioedema.  On review of the chart and his urine drug screen which was positive for benzodiazepines, opiates, amphetamines (many of which are prescribed for his psychiatric conditions) suspect this was the most likely cause.  Patient did not have any further unresponsive episodes here.  #) History of angioedema: Patient was transferred because he has a history of angioedema requiring intubation.  Patient did report some throat swelling and reported his tongue felt larger however his exam and vital signs were unremarkable.  Patient was given diphenhydramine, famotidine.  He apparently has an allergy to steroids and so these were not given as his symptoms do not seem severe.  Patient attributed this to starting his new antipsychotic Rexulti.  Here a noncontrast neck CT was performed which showed no evidence of soft tissue thickening.  Patient's pulse oximetry was monitored throughout did not have any further symptoms.  He was improved on discharge.  #) Bipolar disorder/psych/pain: Patient was continued on home clonazepam, gabapentin, hydroxyzine, oxycodone, tizanidine, trazodone, ziprasidone.  #) Hyperlipidemia: Patient was continued on home statin.  #) History of nephrolithiasis: Patient was continued on home ursodiol.  Discharge Diagnoses:  Principal Problem:    Angioedema Active Problems:   Diabetes mellitus type II, controlled (HCC)   Opioid use disorder [facility])    Discharge Instructions  Discharge Instructions    Call MD for:  difficulty breathing, headache or visual disturbances   Complete by:  As directed    Call MD for:  hives   Complete by:  As directed    Call MD for:  persistant dizziness or light-headedness   Complete by:  As directed    Call MD for:  persistant nausea and vomiting   Complete by:  As directed    Call MD for:  redness, tenderness, or signs of infection (pain, swelling, redness, odor or green/yellow discharge around incision site)   Complete by:  As directed    Call MD for:  severe uncontrolled pain   Complete by:  As directed    Call MD for:  temperature >100.4   Complete by:  As directed    Diet - low sodium heart healthy   Complete by:  As directed    Discharge instructions   Complete by:  As directed    Please follow-up with your primary care doctor in 1 week.  Please follow-up with your psychiatrist.   Increase activity slowly   Complete by:  As directed      Allergies as of 12/17/2017      Reactions   Ace Inhibitors Anaphylaxis   Atenolol Anaphylaxis   Lisinopril Anaphylaxis   Prednisone Hives   Ibuprofen Itching, Swelling, Other (See Comments)   Hand/feet swelling    Rexulti [brexpiprazole] Swelling   Tylenol [acetaminophen] Other (See Comments)   Reaction:  GI bleeding       Medication List    TAKE these medications   albuterol 108 (90 Base) MCG/ACT inhaler Commonly known as:  PROVENTIL HFA;VENTOLIN HFA Inhale 1-2 puffs into the lungs every 6 (six) hours as needed for wheezing or shortness of breath.   atorvastatin 20 MG tablet Commonly known as:  LIPITOR Take 1 tablet (20 mg total) by mouth at bedtime. For high cholesterol   clonazePAM 1 MG tablet Commonly known as:  KLONOPIN TAKE ONE TABLET BY MOUTH TWICE DAILY AS NEEDED panic attacks   COMBIVENT RESPIMAT 20-100 MCG/ACT Aers  respimat Generic drug:  Ipratropium-Albuterol Inhale 1 puff into the lungs every 6 (six) hours as needed for wheezing or shortness of breath.   gabapentin 400 MG capsule Commonly known as:  NEURONTIN Take 2 capsules (800 mg total) by mouth 3 (three) times daily. For agitation/Neuropathy   hydrOXYzine 25 MG tablet Commonly known as:  ATARAX/VISTARIL Take 1 tablet (25 mg total) by mouth every 6 (six) hours as needed for anxiety.   naproxen 375 MG tablet Commonly known as:  NAPROSYN Take 1 tablet (375 mg total) by mouth 2 (two) times daily.   omeprazole 40 MG capsule Commonly known as:  PRILOSEC Take 1 capsule (40 mg total) by mouth 2 (two) times daily. For acid reflux   oxyCODONE 15 MG immediate release tablet Commonly known as:  ROXICODONE Take 15 mg by mouth every 6 (six) hours as needed.   oxyCODONE-acetaminophen 5-325 MG tablet Commonly known as:  PERCOCET/ROXICET Take 1 tablet by mouth 3 (three) times daily as needed.   polyethylene glycol powder powder Commonly known as:  GLYCOLAX/MIRALAX Take 17 g by mouth daily. For constipation   tiZANidine 4 MG capsule Commonly known as:  ZANAFLEX Take 4 mg by mouth 3 (three) times daily as needed for muscle spasms.   traZODone 100 MG tablet Commonly known as:  DESYREL TAKE TWO TABLETS BY MOUTH 45 minutes BEFORE bed   ursodiol 300 MG capsule Commonly known as:  ACTIGALL Take 1 capsule (300 mg total) by mouth 2 (two) times daily. For kidney stone   ziprasidone 80 MG capsule Commonly known as:  GEODON TAKE ONE CAPSULE BY MOUTH EVERY evening WITH FOOD       Allergies  Allergen Reactions  . Ace Inhibitors Anaphylaxis  . Atenolol Anaphylaxis  . Lisinopril Anaphylaxis  . Prednisone Hives  . Ibuprofen Itching, Swelling and Other (See Comments)    Hand/feet swelling   . Rexulti [Brexpiprazole] Swelling  . Tylenol [Acetaminophen] Other (See Comments)    Reaction:  GI bleeding      Consultations:  None   Procedures/Studies: Ct Soft Tissue Neck Wo Contrast  Result Date: 12/16/2017 CLINICAL DATA:  42 year old male with sore throat and tongue swelling after beginning a new medication recently. EXAM: CT NECK WITHOUT CONTRAST TECHNIQUE: Multidetector CT imaging of the neck was performed following the standard protocol without intravenous contrast. COMPARISON:  Cervical spine CT 06/27/2017.  Neck CT 10/04/2015. FINDINGS: Pharynx and larynx: Large body habitus with streak artifact at the thoracic inlet. Motion artifact at the larynx and hypopharynx. The visible epiglottis is nonthickened. No laryngeal or hypopharyngeal edema is evident. Motion artifact continues to the soft palate. The nasopharynx appears stable since 2017. Negative parapharyngeal spaces. Grossly negative retropharyngeal space. No oral tongue abnormality is evident. Salivary glands: Sublingual space and submandibular glands appear stable and negative allowing for some motion. Stable and negative parotid glands. Thyroid: Negative. Lymph nodes: Bilateral cervical lymph nodes are stable since 2017 and normal. Vascular: Vascular patency is not evaluated in the absence of IV contrast. Limited intracranial: Negative. Visualized orbits: Negative. Mastoids and visualized paranasal  sinuses: Sphenoid sinus disease seen in 2017 has resolved. There is mild moderate new mucosal thickening in the right maxillary sinus now. The other visible paranasal sinuses are clear. There are mild chronic mastoid effusions. Tympanic cavities remain clear. Skeleton: Chronic right lamina papyracea fracture. Carious bilateral mandible anterior molars. No acute osseous abnormality identified. Upper chest: Negative visible lung apices. IMPRESSION: 1. Suboptimal due to pharyngeal motion artifact and lack of IV contrast but no pharyngeal or laryngeal edema is evident. There is no lymphadenopathy or inflammation identified in the neck. 2. Mild to  moderate right maxillary sinus mucosal thickening. 3. Carious mandible anterior molars. Electronically Signed   By: Odessa Fleming M.D.   On: 12/16/2017 12:30      Subjective:   Discharge Exam: Vitals:   12/17/17 0445 12/17/17 0748  BP: (!) 141/57 137/72  Pulse: 91 88  Resp: 16   Temp: 98.6 F (37 C) 97.8 F (36.6 C)  SpO2: 99% 99%   Vitals:   12/16/17 1938 12/16/17 2345 12/17/17 0445 12/17/17 0748  BP: (!) 140/119 (!) 110/49 (!) 141/57 137/72  Pulse: 85 78 91 88  Resp: 20 20 16    Temp: 98.8 F (37.1 C) 98.6 F (37 C) 98.6 F (37 C) 97.8 F (36.6 C)  TempSrc: Oral Oral Oral Oral  SpO2: 100% 97% 99% 99%  Weight:   (!) 176.5 kg   Height:       General exam: Appears calm and comfortable  Respiratory system: Clear to auscultation. Respiratory effort normal.  Pickwickian neck, no edema of the throat or larynx noted Cardiovascular system: Regular rate and rhythm, distant heart sounds, no murmurs Gastrointestinal system: Abdomen is nondistended, soft and nontender. No organomegaly or masses felt. Normal bowel sounds heard. Central nervous system: Alert and oriented. No focal neurological deficits. Extremities: Trace lower extremity edema Skin: No rashes over visible skin Psychiatry: Judgement and insight appear normal. Mood & affect appropriate.   The results of significant diagnostics from this hospitalization (including imaging, microbiology, ancillary and laboratory) are listed below for reference.     Microbiology: Recent Results (from the past 240 hour(s))  MRSA PCR Screening     Status: None   Collection Time: 12/16/17  2:24 AM  Result Value Ref Range Status   MRSA by PCR NEGATIVE NEGATIVE Final    Comment:        The GeneXpert MRSA Assay (FDA approved for NASAL specimens only), is one component of a comprehensive MRSA colonization surveillance program. It is not intended to diagnose MRSA infection nor to guide or monitor treatment for MRSA infections. Performed  at [facility], [address] [place], [place] [address]      Labs: BNP (last 3 results) No results for input(s): BNP in the last 8760 hours. Basic Metabolic Panel: Recent Labs  Lab 12/15/17 2049  NA 133*  K 5.0  CL 98  CO2 24  GLUCOSE 135*  BUN 21*  CREATININE 1.21  CALCIUM 9.1   Liver Function Tests: No results for input(s): AST, ALT, ALKPHOS, BILITOT, PROT, ALBUMIN in the last 168 hours. No results for input(s): LIPASE, AMYLASE in the last 168 hours. No results for input(s): AMMONIA in the last 168 hours. CBC: Recent Labs  Lab 12/15/17 2049  WBC 14.9*  HGB 16.3  HCT 48.0  MCV 89.9  PLT 241   Cardiac Enzymes: No results for input(s): CKTOTAL, CKMB, CKMBINDEX, TROPONINI in the last 168 hours. BNP: Invalid input(s): POCBNP CBG: Recent Labs  Lab 12/16/17  1118 12/16/17 1658 12/16/17 2057 12/17/17 0624 12/17/17 1129  GLUCAP 126* 134* 133* 168* 109*   D-Dimer No results for input(s): DDIMER in the last 72 hours. Hgb A1c No results for input(s): HGBA1C in the last 72 hours. Lipid Profile No results for input(s): CHOL, HDL, LDLCALC, TRIG, CHOLHDL, LDLDIRECT in the last 72 hours. Thyroid function studies No results for input(s): TSH, T4TOTAL, T3FREE, THYROIDAB in the last 72 hours.  Invalid input(s): FREET3 Anemia work up No results for input(s): VITAMINB12, FOLATE, FERRITIN, TIBC, IRON, RETICCTPCT in the last 72 hours. Urinalysis    Component Value Date/Time   COLORURINE YELLOW 05/25/2017 1457   APPEARANCEUR CLEAR 05/25/2017 1457   LABSPEC 1.010 05/25/2017 1457   PHURINE 6.5 05/25/2017 1457   GLUCOSEU NEGATIVE 05/25/2017 1457   HGBUR NEGATIVE 05/25/2017 1457   BILIRUBINUR NEGATIVE 05/25/2017 1457   KETONESUR NEGATIVE 05/25/2017 1457   PROTEINUR NEGATIVE 05/25/2017 1457   UROBILINOGEN 1.0 08/18/2014 2002   NITRITE NEGATIVE 05/25/2017 1457   LEUKOCYTESUR NEGATIVE 05/25/2017 1457   Sepsis Labs Invalid input(s): PROCALCITONIN,  WBC,   LACTICIDVEN Microbiology Recent Results (from the past 240 hour(s))  MRSA PCR Screening     Status: None   Collection Time: 12/16/17  2:24 AM  Result Value Ref Range Status   MRSA by PCR NEGATIVE NEGATIVE Final    Comment:        The GeneXpert MRSA Assay (FDA approved for NASAL specimens only), is one component of a comprehensive MRSA colonization surveillance program. It is not intended to diagnose MRSA infection nor to guide or monitor treatment for MRSA infections. Performed at [facility], [address] [place], [place] [address]      Time coordinating discharge: 63  SIGNED:   Delaine Lame, MD  [facility] 12/17/2017, 11:57 AM  If 7PM-7AM, please contact night-coverage [email or web address] Password [email or web address]

## 2017-12-17 NOTE — Progress Notes (Addendum)
Patient's sister Bertha Stakes notified via phone that the patient left the hospital without his discharge education.

## 2017-12-17 NOTE — Discharge Instructions (Signed)
Angioedema Angioedema is the sudden swelling of tissue in the body. Angioedema can affect any part of the body, but it most often affects the deeper parts of the skin, causing red, itchy patches (hives) to appear over the affected area. It often begins during the night and is found in the morning. Depending on the cause, angioedema may happen:  Only once.  Several times. It may come back in unpredictable patterns.  Repeatedly for several years. Over time, it may gradually stop coming back.  Angioedema can be life-threatening if it affects the air passages that you breathe through. What are the causes? This condition may be caused by:  Foods, such as milk, eggs, shellfish, wheat, or nuts.  Certain medicines, such as ACE inhibitors, antibiotics, nonsteroidal anti-inflammatory drugs, birth control pills, or dyes used in X-rays.  Insect stings.  Infections.  Angioedema can be inherited, and episodes can be triggered by:  Mild injury.  Dental work.  Surgery.  Stress.  Sudden changes in temperature.  Exercise.  In some cases, the cause of this condition is not known. What are the signs or symptoms? Symptoms of this condition depend on where the swelling happens. Symptoms may include:  Swollen skin.  Red, itchy patches of skin (hives).  Redness in the affected area.  Pain in the affected area.  Swollen lips or tongue.  Wheezing.  Breathing problems.  If your internal organs are involved, symptoms may also include:  Nausea.  Abdominal pain.  Vomiting.  Difficulty swallowing.  Difficulty passing urine.  How is this diagnosed? This condition may be diagnosed based on:  An exam of the affected area.  Your medical history.  Whether anyone in your family has had this condition before.  A review of any medicines you have been taking.  Tests, including: ? Allergy skin tests to see if the condition was caused by an allergic reaction. ? Blood tests to see  if the condition was caused by a gene. ? Tests to check for underlying diseases that could cause the condition.  How is this treated? Treatment for this condition depends on the cause. It may involve any of the following:  If something triggered the condition, making changes to keep it from triggering the condition again.  If the condition affects your breathing, having tubes placed in your airway to keep it open.  Taking medicines to treat symptoms or prevent future episodes. These may include: ? Antihistamines. ? Epinephrine injections. ? Steroids.  If your condition is severe, you may need to be treated at the hospital. Angioedema usually gets better in 24-48 hours. Follow these instructions at home:  Take over-the-counter and prescription medicines only as told by your health care provider.  If you were given medicines for emergency allergy treatment, always carry them with you.  Wear a medical bracelet as told by your health care provider.  If something triggers your condition, avoid the trigger, if possible.  If your condition is inherited and you are thinking about having children, talk to your health care provider. It is important to discuss the risks of passing on the condition to your children. Contact a health care provider if:  You have repeated episodes of angioedema.  Episodes of angioedema start to happen more often than they used to, even after you take steps to prevent them.  You have episodes of angioedema that are more severe than they have been before, even after you take steps to prevent them.  You are thinking about having children. Get   help right away if:  You have severe swelling of your mouth, tongue, or lips.  You have trouble breathing.  You have trouble swallowing.  You faint. This information is not intended to replace advice given to you by your health care provider. Make sure you discuss any questions you have with your health care  provider. Document Released: 04/26/2001 Document Revised: 09/13/2015 Document Reviewed: 08/26/2015 Elsevier Interactive Patient Education  2018 Elsevier Inc.  

## 2017-12-17 NOTE — Progress Notes (Signed)
Patient called for his pain medicine, upon arrival at the room ,patient was noted to have removed his IV and  tele box, he stated ''the doctor said I'm going home. Patient educated on the need for him not to take his IV and tele box off by himself, he was also educated on the need for him to have his discharge education completed before he goes home, he verbalized understanding.

## 2017-12-17 NOTE — Progress Notes (Signed)
Patient not found in the room, Purohit MD notified. Charge nurse Technical brewer notified.

## 2018-01-13 ENCOUNTER — Telehealth: Payer: Self-pay | Admitting: Pharmacist

## 2018-01-13 DIAGNOSIS — B182 Chronic viral hepatitis C: Secondary | ICD-10-CM

## 2018-01-13 NOTE — Telephone Encounter (Signed)
Patient was seen back in September by Rexene AlbertsStephanie Dixon, NP for vertebral osteo and chronic Hepatitis C infection. Patient never scheduled elastography appointment to determine fibrosis score.  Patient's APRI score is 0.492 and FIB 4 score is 1.17, indicating low risk for advanced fibrosis.  This also correlates with our in-house serum fibrotest results of F1/F2.  [facility] to see if we can proceed with treatment if patient agreeable.

## 2018-01-13 NOTE — Telephone Encounter (Signed)
agree with the plan thank you!

## 2018-01-27 ENCOUNTER — Other Ambulatory Visit: Payer: Self-pay | Admitting: Pharmacist

## 2018-01-27 DIAGNOSIS — B182 Chronic viral hepatitis C: Secondary | ICD-10-CM

## 2018-01-27 MED ORDER — LEDIPASVIR-SOFOSBUVIR 90-400 MG PO TABS: 1.0000 | ORAL_TABLET | Freq: Every day | ORAL | 2 refills | Status: DC

## 2018-01-27 NOTE — Addendum Note (Signed)
Addended by: Robinette HainesKUPPELWEISER, Cheyeanne Roadcap L on: 01/27/2018 04:05 PM   Modules accepted: Orders

## 2018-01-27 NOTE — Telephone Encounter (Signed)
Patient called back. He does need an updated Hep C viral load. He will stop in next Friday 12/6 and get that drawn. Kathie RhodesBetty will submit Harvoni to his Medicare insurance after that has returned.

## 2018-01-27 NOTE — Telephone Encounter (Signed)
Called patient to discuss and left a HIPAA compliant voicemail.  Will send in Harvoni x 12 weeks for him.

## 2018-02-03 ENCOUNTER — Other Ambulatory Visit: Payer: Medicare Other

## 2018-02-24 ENCOUNTER — Emergency Department (HOSPITAL_COMMUNITY)
Admission: EM | Admit: 2018-02-24 | Discharge: 2018-02-25 | Discharge status: Against Medical Advice | Payer: Medicare Other | Attending: Emergency Medicine | Admitting: Emergency Medicine

## 2018-02-24 ENCOUNTER — Encounter (HOSPITAL_COMMUNITY): Payer: Self-pay | Admitting: Emergency Medicine

## 2018-02-24 DIAGNOSIS — M545 Low back pain, unspecified: Secondary | ICD-10-CM

## 2018-02-24 DIAGNOSIS — E119 Type 2 diabetes mellitus without complications: Secondary | ICD-10-CM | POA: Insufficient documentation

## 2018-02-24 DIAGNOSIS — Z5320 Procedure and treatment not carried out because of patient's decision for unspecified reasons: Secondary | ICD-10-CM | POA: Insufficient documentation

## 2018-02-24 DIAGNOSIS — I10 Essential (primary) hypertension: Secondary | ICD-10-CM | POA: Insufficient documentation

## 2018-02-24 DIAGNOSIS — L539 Erythematous condition, unspecified: Secondary | ICD-10-CM | POA: Insufficient documentation

## 2018-02-24 DIAGNOSIS — G8929 Other chronic pain: Secondary | ICD-10-CM | POA: Insufficient documentation

## 2018-02-24 DIAGNOSIS — F319 Bipolar disorder, unspecified: Secondary | ICD-10-CM | POA: Insufficient documentation

## 2018-02-24 DIAGNOSIS — F17210 Nicotine dependence, cigarettes, uncomplicated: Secondary | ICD-10-CM | POA: Diagnosis not present

## 2018-02-24 DIAGNOSIS — Z79899 Other long term (current) drug therapy: Secondary | ICD-10-CM | POA: Diagnosis not present

## 2018-02-24 LAB — CBC WITH DIFFERENTIAL/PLATELET
Abs Immature Granulocytes: 0.01 10*3/uL (ref 0.00–0.07)
Basophils Absolute: 0 10*3/uL (ref 0.0–0.1)
Basophils Relative: 0 %
EOS ABS: 0.3 10*3/uL (ref 0.0–0.5)
EOS PCT: 3 %
HEMATOCRIT: 42.4 % (ref 39.0–52.0)
Hemoglobin: 13.8 g/dL (ref 13.0–17.0)
IMMATURE GRANULOCYTES: 0 %
Lymphocytes Relative: 34 %
Lymphs Abs: 3 10*3/uL (ref 0.7–4.0)
MCH: 30.1 pg (ref 26.0–34.0)
MCHC: 32.5 g/dL (ref 30.0–36.0)
MCV: 92.6 fL (ref 80.0–100.0)
Monocytes Absolute: 0.7 10*3/uL (ref 0.1–1.0)
Monocytes Relative: 8 %
Neutro Abs: 4.8 10*3/uL (ref 1.7–7.7)
Neutrophils Relative %: 55 %
Platelets: 265 10*3/uL (ref 150–400)
RBC: 4.58 MIL/uL (ref 4.22–5.81)
RDW: 12.6 % (ref 11.5–15.5)
WBC: 8.9 10*3/uL (ref 4.0–10.5)
nRBC: 0 % (ref 0.0–0.2)

## 2018-02-24 LAB — I-STAT CHEM 8, ED
BUN: 9 mg/dL (ref 6–20)
CREATININE: 0.7 mg/dL (ref 0.61–1.24)
Calcium, Ion: 1.1 mmol/L — ABNORMAL LOW (ref 1.15–1.40)
Chloride: 103 mmol/L (ref 98–111)
Glucose, Bld: 124 mg/dL — ABNORMAL HIGH (ref 70–99)
HEMATOCRIT: 45 % (ref 39.0–52.0)
HEMOGLOBIN: 15.3 g/dL (ref 13.0–17.0)
POTASSIUM: 4.5 mmol/L (ref 3.5–5.1)
Sodium: 138 mmol/L (ref 135–145)
TCO2: 30 mmol/L (ref 22–32)

## 2018-02-24 LAB — RAPID URINE DRUG SCREEN, HOSP PERFORMED
AMPHETAMINES: POSITIVE — AB
Barbiturates: NOT DETECTED
Benzodiazepines: POSITIVE — AB
Cocaine: NOT DETECTED
Opiates: POSITIVE — AB
TETRAHYDROCANNABINOL: NOT DETECTED

## 2018-02-24 LAB — I-STAT CG4 LACTIC ACID, ED: Lactic Acid, Venous: 0.94 mmol/L (ref 0.5–1.9)

## 2018-02-24 MED ORDER — HYDROMORPHONE HCL 1 MG/ML IJ SOLN
1.0000 mg | Freq: Once | INTRAMUSCULAR | Status: AC
  Administered 2018-02-24: 1 mg via INTRAVENOUS
  Filled 2018-02-24: qty 1

## 2018-02-24 MED ORDER — ONDANSETRON HCL 4 MG/2ML IJ SOLN
4.0000 mg | Freq: Once | INTRAMUSCULAR | Status: AC
  Administered 2018-02-24: 4 mg via INTRAVENOUS
  Filled 2018-02-24: qty 2

## 2018-02-24 MED ORDER — SODIUM CHLORIDE 0.9 % IV BOLUS
1000.0000 mL | Freq: Once | INTRAVENOUS | Status: AC
  Administered 2018-02-24: 1000 mL via INTRAVENOUS

## 2018-02-24 NOTE — ED Provider Notes (Signed)
[facility] EMERGENCY DEPARTMENT Provider Note   CSN: [id number] Arrival date & time: 02/24/18  1956     History   Chief Complaint Chief Complaint  Patient presents with  . Abscess  . Back Pain    HPI Bryan Gallegos is a 42 y.o. male.  The history is provided by the patient and medical records. No language interpreter was used.     42 year old obese male with history of angioedema, polysubstance abuse, bipolar, diabetes presenting for evaluation of skin infection.  Patient mention that he has history of spinal infection that caused him to have chronic low back pain.  He endorsed within the past 4 to 5 days he has had worsening lower back pain, sharp throbbing and now his left leg is very painful and is unable to ambulate.  Furthermore, he also complaining of noticing several skin lesion that popped up in both of his arms and hands.  It is painful with associated swelling.  He tries to lance it at home without adequate relief.  He endorsed recent fever, chills, headache, light and sound sensitivity, feeling nauseous, vomiting and not feeling well.  He admits to remote history of IV drug use last year.  Patient does not complain of any chest pain shortness of breath productive cough or dysuria he is up-to-date with tetanus.  Past Medical History:  Diagnosis Date  . Adult ADHD   . Anaphylactic reaction   . Anxiety   . Asthma   . Bipolar disorder (HCC)   . Complication of anesthesia    Per patient difficult intubation;  . Diabetes mellitus   . Difficult intubation    Per patient  . Drug overdose   . Heart valve disorder    s/p echocardiogram  . Hyperlipidemia   . Hypertension   . Morbidly obese (HCC)   . OSA (obstructive sleep apnea)   . Polysubstance abuse (HCC)   . Transient cerebral ischemia    Unknown    Patient Active Problem List   Diagnosis Date Noted  . 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 Swelling of right hand 06/27/2017  . Bipolar affective  disorder (HCC)   . Vertebral osteomyelitis (HCC) 05/01/2017  . 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 Opioid use disorder (HCC)   . Hepatitis C infection   . History of suicide attempt 10/17/2016  . Chronic pain syndrome 10/22/2015  . Polysubstance dependence including opioid type drug, episodic abuse (HCC) 09/04/2015  . Cholelithiasis 05/17/2015  . Antisocial personality disorder (HCC) 04/11/2015  . Benzodiazepine dependence (HCC) 04/11/2015  . Tobacco use disorder 04/10/2015  . Gastroesophageal reflux disease with esophagitis 01/28/2014  . Diabetes mellitus type II, controlled (HCC) 01/28/2014  . Morbid obesity (HCC) 01/28/2014  . TIA (transient ischemic attack) 09/02/2013  . HLD (hyperlipidemia) 08/06/2013  . OSA on CPAP 08/06/2013    Past Surgical History:  Procedure Laterality Date  . CARPAL TUNNEL RELEASE    . ESOPHAGOGASTRODUODENOSCOPY N/A 04/05/2015   Procedure: ESOPHAGOGASTRODUODENOSCOPY (EGD);  Surgeon: Malissa Hippo, MD;  Location: [facility];  Service: Endoscopy;  Laterality: N/A;  . ESOPHAGOGASTRODUODENOSCOPY (EGD) WITH PROPOFOL N/A 05/21/2015   Procedure: ESOPHAGOGASTRODUODENOSCOPY (EGD) WITH PROPOFOL;  Surgeon: Meryl Dare, MD;  Location: [facility] ENDOSCOPY;  Service: Endoscopy;  Laterality: N/A;  . NOSE SURGERY    . TOOTH EXTRACTION    . WISDOM TOOTH EXTRACTION          Home Medications    Prior to Admission medications  Medication Sig Start Date End Date Taking? Authorizing Provider  albuterol (PROVENTIL HFA;VENTOLIN HFA) 108 (90 Base) MCG/ACT inhaler Inhale 1-2 puffs into the lungs every 6 (six) hours as needed for wheezing or shortness of breath. 02/10/17   Armandina Stammer I, NP  atorvastatin (LIPITOR) 20 MG tablet Take 1 tablet (20 mg total) by mouth at bedtime. For high cholesterol 02/10/17   Nwoko, Nicole Kindred I, NP  clonazePAM (KLONOPIN) 1 MG tablet TAKE ONE TABLET BY MOUTH TWICE DAILY AS NEEDED panic attacks  05/28/17   [provider]  COMBIVENT RESPIMAT 20-100 MCG/ACT AERS respimat Inhale 1 puff into the lungs every 6 (six) hours as needed for wheezing or shortness of breath. 02/10/17   Armandina Stammer I, NP  gabapentin (NEURONTIN) 400 MG capsule Take 2 capsules (800 mg total) by mouth 3 (three) times daily. For agitation/Neuropathy 02/10/17   Armandina Stammer I, NP  hydrOXYzine (ATARAX/VISTARIL) 25 MG tablet Take 1 tablet (25 mg total) by mouth every 6 (six) hours as needed for anxiety. 02/10/17   Armandina Stammer I, NP  Ledipasvir-Sofosbuvir (HARVONI) 90-400 MG TABS Take 1 tablet by mouth daily. 01/27/18   Kuppelweiser, Cassie L, RPH-CPP  naproxen (NAPROSYN) 375 MG tablet Take 1 tablet (375 mg total) by mouth 2 (two) times daily. 06/27/17   Felicie Morn, NP  omeprazole (PRILOSEC) 40 MG capsule Take 1 capsule (40 mg total) by mouth 2 (two) times daily. For acid reflux 02/10/17   Armandina Stammer I, NP  oxyCODONE (ROXICODONE) 15 MG immediate release tablet Take 15 mg by mouth every 6 (six) hours as needed. 06/22/17   [provider]  oxyCODONE-acetaminophen (PERCOCET/ROXICET) 5-325 MG tablet Take 1 tablet by mouth 3 (three) times daily as needed. 05/23/17   [provider]  polyethylene glycol powder (MIRALAX) powder Take 17 g by mouth daily. For constipation 02/10/17   Armandina Stammer I, NP  tiZANidine (ZANAFLEX) 4 MG capsule Take 4 mg by mouth 3 (three) times daily as needed for muscle spasms. 04/23/17   [provider]  traZODone (DESYREL) 100 MG tablet TAKE TWO TABLETS BY MOUTH 45 minutes BEFORE bed 05/28/17   [provider]  ursodiol (ACTIGALL) 300 MG capsule Take 1 capsule (300 mg total) by mouth 2 (two) times daily. For kidney stone 02/10/17   Armandina Stammer I, NP  ziprasidone (GEODON) 80 MG capsule TAKE ONE CAPSULE BY MOUTH EVERY evening WITH FOOD 05/28/17   [provider]  lisinopril (PRINIVIL,ZESTRIL) 40 MG tablet Take 1 tablet (40 mg total) by mouth 2 (two) times  daily. For high blood pressure 09/05/15 10/01/15  Sanjuana Kava, NP    Family History Family History  Problem Relation Age of Onset  . CAD Mother        Living  . Diabetes Mellitus II Mother   . Stroke Mother   . Hypertension Mother   . Congestive Heart Failure Mother   . Kidney disease Mother   . Fibromyalgia Mother   . Thyroid disease Mother   . Hyperlipidemia Mother   . Liver disease Mother   . Alcoholism Father 3       Deceased  . Arthritis Maternal Grandmother   . Congestive Heart Failure Maternal Grandmother   . Hypertension Maternal Grandmother   . Lung cancer Maternal Grandfather   . Colon cancer Maternal Aunt   . Stomach cancer Maternal Aunt   . Heart disease Other        Paternal & Maternal  . Crohn's disease Other   .  Hypertension Other        Paternal & Maternal  . Hypertension Brother        x3  . Hypertension Sister        #1  . Bipolar disorder Sister        #1  . ADD / ADHD Son        x3  . Bipolar disorder Son        x3  . Asperger's syndrome Son     Social History Social History   Tobacco Use  . Smoking status: Current Every Day Smoker    Packs/day: 0.50    Years: 23.00    Pack years: 11.50    Types: Cigarettes  . Smokeless tobacco: Never Used  Substance Use Topics  . Alcohol use: No    Alcohol/week: 0.0 standard drinks  . Drug use: Not Currently    Types: Benzodiazepines, Heroin     Allergies   Ace inhibitors; Atenolol; Lisinopril; Prednisone; Ibuprofen; Rexulti [brexpiprazole]; and Tylenol [acetaminophen]   Review of Systems Review of Systems  All other systems reviewed and are negative.    Physical Exam Updated Vital Signs BP (!) 151/85   Pulse 89   Temp 98.5 F (36.9 C) (Oral)   Resp (!) 22   Ht 6\' 2"  (1.88 m)   Wt (!) 174.6 kg   SpO2 97%   BMI 49.43 kg/m   Physical Exam Vitals signs and nursing note reviewed.  Constitutional:      General: He is not in acute distress.    Appearance: He is well-developed.      Comments: Patient is morbidly obese, nontoxic in appearance.   HENT:     Head: Atraumatic.  Eyes:     Conjunctiva/sclera: Conjunctivae normal.  Neck:     Musculoskeletal: Neck supple. No neck rigidity.     Comments: No nuchal rigidity Cardiovascular:     Rate and Rhythm: Normal rate and regular rhythm.     Pulses: Normal pulses.     Heart sounds: Normal heart sounds.  Pulmonary:     Effort: Pulmonary effort is normal.     Breath sounds: Normal breath sounds.  Abdominal:     Palpations: Abdomen is soft.  Musculoskeletal:        General: Tenderness (Tenderness along lumbar spine on palpation without any overlying skin changes, no crepitus no, no step-off and no skin erythema or edema.) present.  Skin:    Findings: No rash.     Comments: Bilateral forearms with multiple area of induration with scab formation and mild surrounding skin erythema that is tender to palpation.  Multiple linear abrasion noted to left mid forearm.  Radial pulse 2+.  Several lesions to the dorsums of both hands.  Neurological:     Mental Status: He is alert and oriented to person, place, and time.      ED Treatments / Results  Labs (all labs ordered are listed, but only abnormal results are displayed) Labs Reviewed  RAPID URINE DRUG SCREEN, HOSP PERFORMED - Abnormal; Notable for the following components:      Result Value   Opiates POSITIVE (*)    Benzodiazepines POSITIVE (*)    Amphetamines POSITIVE (*)    All other components within normal limits  I-STAT CHEM 8, ED - Abnormal; Notable for the following components:   Glucose, Bld 124 (*)    Calcium, Ion 1.10 (*)    All other components within normal limits  CULTURE, BLOOD (ROUTINE X 2)  CULTURE, BLOOD (ROUTINE X 2)  CBC WITH DIFFERENTIAL/PLATELET  CBC WITH DIFFERENTIAL/PLATELET  SEDIMENTATION RATE  C-REACTIVE PROTEIN  I-STAT CG4 LACTIC ACID, ED    EKG None  Radiology No results found.  Procedures Procedures (including critical care  time)  Medications Ordered in ED Medications  HYDROmorphone (DILAUDID) injection 1 mg (has no administration in time range)  ondansetron (ZOFRAN) injection 4 mg (has no administration in time range)  sodium chloride 0.9 % bolus 1,000 mL (has no administration in time range)     Initial Impression / Assessment and Plan / ED Course  I have reviewed the triage vital signs and the nursing notes.  Pertinent labs & imaging results that were available during my care of the patient were reviewed by me and considered in my medical decision making (see chart for details).    BP (!) 143/94   Pulse 80   Temp 98.5 F (36.9 C) (Oral)   Resp 17   Ht 6\' 2"  (1.88 m)   Wt (!) 174.6 kg   SpO2 96%   BMI 49.43 kg/m    Final Clinical Impressions(s) / ED Diagnoses   Final diagnoses:  None    ED Discharge Orders    None     8:58 PM Patient report history of spinal infection in the past here with back pain, left leg pain and weakness along with multiple skin lesions to both of his forearms and hands.  History of IV drug use in the past.  He also complained of headache, subjective fever and chills.  I am concerned for potential epidural abscess that may have seeded to his extremities.  Work-up initiated, will obtain lumbar spine MRI with and without contrast.  Care discussed with Dr. Madilyn Hookees.  10:03 PM BS is positive for opiate, benzodiazepine, and amphetamine.  These findings are consistent with patient home medication.   Pt given pain medication.  Currently awaits MRI of thoracic and lumbar spine.  No antibiotic initiated until MRI have resulted.  Currently lactic acid is normal, pt is afebrile, he does not appear toxic.  No nuchal rigidity to suggest meningitis.  No obvious abscess amenable for I&D.    12:02 AM Pt sign out to BoeingChris Lawyer, PA-C who will f/u on MRI result.  If negative, pt can be discharge home with antibiotic to cover for cellulitis with MRSA coverage.  Doxycycline would be a  good choice.    Fayrene Helperran, Payal Stanforth, PA-C 02/25/18 Ivor Reining0003    Tilden Fossaees, Elizabeth, MD 03/03/18 [phone]

## 2018-02-24 NOTE — ED Notes (Signed)
Sent a urine culture with UDS

## 2018-02-24 NOTE — ED Notes (Signed)
Per main lab recollect cbc due to blood clotted.

## 2018-02-24 NOTE — ED Triage Notes (Signed)
  Patient BIB EMS for open sores on bilateral arms.  Patient states he was admitted for infection in his arms and spine earlier this year and discharged with PICC line antibiotics.  Patient states he had two abscess open up on his arms and he "opened them up" himself at home.  Patient noted swelling, weeping, and redness around areas.  Patient also states he has N/V for the past few days and has had nothing PO for 4 days.  Patient has hx of HTN, type 2 diabetes, and chronic back pain.  Pain 9/10.  Patient states he fell twice last night and woke up on the ground.  No fevers at home.  Patient A&O x4.

## 2018-02-25 ENCOUNTER — Emergency Department (HOSPITAL_COMMUNITY): Payer: Medicare Other

## 2018-02-25 DIAGNOSIS — G8929 Other chronic pain: Secondary | ICD-10-CM | POA: Diagnosis not present

## 2018-02-25 MED ORDER — LORAZEPAM 2 MG/ML IJ SOLN: 1.0000 mg | Freq: Once | INTRAMUSCULAR | Status: DC

## 2018-02-25 MED ORDER — HYDROMORPHONE HCL 1 MG/ML IJ SOLN
1.0000 mg | Freq: Once | INTRAMUSCULAR | Status: AC
  Administered 2018-02-25: 1 mg via INTRAVENOUS
  Filled 2018-02-25: qty 1

## 2018-02-25 MED ORDER — KETAMINE HCL 50 MG/ML IJ SOLN
1.5000 mg/kg | Freq: Once | INTRAMUSCULAR | Status: AC
  Administered 2018-02-25: 260 mg via INTRAMUSCULAR
  Filled 2018-02-25: qty 10

## 2018-02-25 MED ORDER — LORAZEPAM 2 MG/ML IJ SOLN
1.0000 mg | Freq: Once | INTRAMUSCULAR | Status: AC
  Administered 2018-02-25: 1 mg via INTRAVENOUS
  Filled 2018-02-25: qty 1

## 2018-02-25 MED ORDER — HYDROMORPHONE HCL 1 MG/ML IJ SOLN
2.0000 mg | Freq: Once | INTRAMUSCULAR | Status: AC
  Administered 2018-02-25: 2 mg via INTRAVENOUS
  Filled 2018-02-25: qty 2

## 2018-02-25 MED ORDER — KETAMINE HCL 50 MG/ML IJ SOLN: 1.5000 mg/kg | Freq: Once | INTRAMUSCULAR | Status: DC | PRN

## 2018-02-25 NOTE — Progress Notes (Signed)
Patient medicated for exams and continued to move and complain of pain.  Repeated sequences four times and still could not get good images.  Nurse notified.

## 2018-02-25 NOTE — ED Notes (Signed)
Pt in MRI scanner.  Unable to scan Med or Pt.

## 2018-02-25 NOTE — ED Notes (Signed)
Pt eloped.  Pt walked out stating "I have been discharged"

## 2018-02-25 NOTE — ED Notes (Signed)
Pt transported to MRI 

## 2018-02-25 NOTE — ED Provider Notes (Signed)
42 year old male received at signout from [place] lawyer pending MRI. Per his HPI:  "42 year old obese male with history of vertebral osteomyelitis, angioedema, polysubstance abuse, bipolar, diabetes presenting for evaluation of skin infection.  Patient mention that he has history of spinal infection that caused him to have chronic low back pain.  He endorsed within the past 4 to 5 days he has had worsening lower back pain, sharp throbbing and now his left leg is very painful and is unable to ambulate.  Furthermore, he also complaining of noticing several skin lesion that popped up in both of his arms and hands.  It is painful with associated swelling.  He tries to lance it at home without adequate relief.  He endorsed recent fever, chills, headache, light and sound sensitivity, feeling nauseous, vomiting and not feeling well.  He admits to remote history of IV drug use last year.  Patient does not complain of any chest pain shortness of breath productive cough or dysuria he is up-to-date with tetanus."  Physical Exam  BP 133/60 (BP Location: Right Arm)   Pulse 74   Temp 98.5 F (36.9 C) (Oral)   Resp 20   Ht 6\' 2"  (1.88 m)   Wt (!) 174.6 kg   SpO2 98%   BMI 49.43 kg/m   Physical Exam  No tenderness to the cervical or thoracic spinous processes or bilateral paraspinal muscles.  He has some diffuse tenderness to the lumbar spinous processes with maximal tenderness over the left SI joint.  Ambulatory without difficulty.  DP pulses are 2+ and symmetric.  Sensation is intact and equal throughout.  There is some mild mottling of the bilateral lower extremities, which appears chronic.  There are 2 wounds on his bilateral upper extremities with some surrounding warmth that have been I&Ded.   ED Course/Procedures   MDM    42 year old obese male with a history of angioedema, IVDU, polysubstance abuse, bipolar disorder, and diabetes mellitus received a signout from [place] lawyer pending MRI of the thoracic and  lumbar spine.  He has been hemodynamically stable since arrival.  He was treated for vertebral osteomyelitis and March 2019.   Prior to my initiation of care, the patient's pain has been difficult to control.  He states that his last IV drug use was in January.  He has been noted to be pacing the halls despite being asked multiple times by staff to remain in his room.  Prior to going to MRI, the patient was given 1 mg of Ativan.  Notified by MRI staff while the patient was in their care that his pain was not controlled.  Repeat Dilaudid was ordered.  MRI staff reports the patient was able to tolerate the thoracic spine MRI, but could not tolerate laying flat for the lumbar spine.  Discussed the patient with Dr. Jacqulyn BathLong, attending physician.  Analgesic ketamine dose was ordered.  On reevaluation, the patient reports that he was feeling much better and felt that he could tolerate the lumbar spine MRI.  Called and spoke with MRI tech who was unable to provide me with the time for the patient's procedure.  I asked the tech to attempt to provide me with a 15-minute window so I can give the patient a second dose of analgesic ketamine to ensure that he can tolerate the procedure.  Although I suspect the patient's symptoms are more consistent with lumbar radiculopathy with left-sided sciatica, given his history of vertebral osteomyelitis, I do feel that the lumbar MRI is warranted  at this time.  Clinical Course as of Feb 25 1630  Sat Feb 25, 2018  1100 Upon entry into the room, drops of blood are present all over the floor.  The patient states that his IV got caught while he was up walking and came out.    [MM]  1111 Notified by nursing staff that the patient was requesting to leave.  The patient states that he got up and wanted to ambulate to the bathroom but was told by nursing staff to stay in the room.  Encourage the patient to let us dress his wound on his foot.  Will order analgesic ketamine IM to attempt to  get the patient's pain under control and perform lumbar MRI given the patient's pain on exam.  He is agreeable with the plan at this time.   [MM]  1338 Notified by nursing staff that the patient was seen walking out the front door the hospital.  I was not able to provide the patient with return precautions since he eloped.   [MM]    Clinical Course User Index [MM] Chez Bulnes A, PA-C   We were unable to discuss the nature and purpose, risks and benefits, as well as, the alternatives of treatment since the patient eloped. I was unable to inform the patient was that refusal could lead to, but was not limited to, death, permanent disability, or severe pain due to his elopment.   The patient was not notified that they may return to the emergency department at any time for further treatment due to his elopement.   Procedures        Barkley BoardsMcDonald, Miku Udall A, PA-C 02/25/18 1631    Maia PlanLong, Joshua G, MD 02/25/18 2028

## 2018-03-01 LAB — CULTURE, BLOOD (ROUTINE X 2)
Culture: NO GROWTH
Culture: NO GROWTH
Special Requests: ADEQUATE
Special Requests: ADEQUATE

## 2018-03-14 ENCOUNTER — Encounter (HOSPITAL_COMMUNITY): Payer: Self-pay | Admitting: Emergency Medicine

## 2018-03-14 ENCOUNTER — Encounter (HOSPITAL_COMMUNITY): Payer: Self-pay

## 2018-03-14 ENCOUNTER — Emergency Department (HOSPITAL_COMMUNITY)
Admission: EM | Admit: 2018-03-14 | Discharge: 2018-03-14 | Disposition: A | Discharge status: Home / Self Care | Payer: Medicare HMO | Source: Home / Self Care | Attending: Emergency Medicine | Admitting: Emergency Medicine

## 2018-03-14 ENCOUNTER — Other Ambulatory Visit: Payer: Self-pay

## 2018-03-14 ENCOUNTER — Inpatient Hospital Stay (HOSPITAL_COMMUNITY)
Admission: EM | Admit: 2018-03-14 | Discharge: 2018-03-18 | DRG: 580 | Disposition: A | Discharge status: Home / Self Care | Payer: Medicare HMO | Attending: Internal Medicine | Admitting: Internal Medicine

## 2018-03-14 ENCOUNTER — Emergency Department (HOSPITAL_COMMUNITY): Payer: Medicare HMO

## 2018-03-14 DIAGNOSIS — E119 Type 2 diabetes mellitus without complications: Secondary | ICD-10-CM | POA: Diagnosis present

## 2018-03-14 DIAGNOSIS — J45909 Unspecified asthma, uncomplicated: Secondary | ICD-10-CM | POA: Diagnosis present

## 2018-03-14 DIAGNOSIS — F909 Attention-deficit hyperactivity disorder, unspecified type: Secondary | ICD-10-CM | POA: Diagnosis present

## 2018-03-14 DIAGNOSIS — Z7984 Long term (current) use of oral hypoglycemic drugs: Secondary | ICD-10-CM

## 2018-03-14 DIAGNOSIS — Z9119 Patient's noncompliance with other medical treatment and regimen: Secondary | ICD-10-CM

## 2018-03-14 DIAGNOSIS — K2100 Gastro-esophageal reflux disease with esophagitis, without bleeding: Secondary | ICD-10-CM | POA: Diagnosis present

## 2018-03-14 DIAGNOSIS — F1120 Opioid dependence, uncomplicated: Secondary | ICD-10-CM | POA: Diagnosis present

## 2018-03-14 DIAGNOSIS — L03113 Cellulitis of right upper limb: Secondary | ICD-10-CM | POA: Diagnosis not present

## 2018-03-14 DIAGNOSIS — Z915 Personal history of self-harm: Secondary | ICD-10-CM

## 2018-03-14 DIAGNOSIS — Z79899 Other long term (current) drug therapy: Secondary | ICD-10-CM

## 2018-03-14 DIAGNOSIS — Z6841 Body Mass Index (BMI) 40.0 and over, adult: Secondary | ICD-10-CM

## 2018-03-14 DIAGNOSIS — R0602 Shortness of breath: Secondary | ICD-10-CM | POA: Insufficient documentation

## 2018-03-14 DIAGNOSIS — F17200 Nicotine dependence, unspecified, uncomplicated: Secondary | ICD-10-CM | POA: Diagnosis present

## 2018-03-14 DIAGNOSIS — F17210 Nicotine dependence, cigarettes, uncomplicated: Secondary | ICD-10-CM | POA: Insufficient documentation

## 2018-03-14 DIAGNOSIS — L03119 Cellulitis of unspecified part of limb: Secondary | ICD-10-CM

## 2018-03-14 DIAGNOSIS — Z8249 Family history of ischemic heart disease and other diseases of the circulatory system: Secondary | ICD-10-CM

## 2018-03-14 DIAGNOSIS — B1920 Unspecified viral hepatitis C without hepatic coma: Secondary | ICD-10-CM | POA: Diagnosis present

## 2018-03-14 DIAGNOSIS — G4733 Obstructive sleep apnea (adult) (pediatric): Secondary | ICD-10-CM | POA: Diagnosis present

## 2018-03-14 DIAGNOSIS — G894 Chronic pain syndrome: Secondary | ICD-10-CM | POA: Diagnosis present

## 2018-03-14 DIAGNOSIS — I10 Essential (primary) hypertension: Secondary | ICD-10-CM | POA: Insufficient documentation

## 2018-03-14 DIAGNOSIS — F332 Major depressive disorder, recurrent severe without psychotic features: Secondary | ICD-10-CM | POA: Diagnosis present

## 2018-03-14 DIAGNOSIS — Z79891 Long term (current) use of opiate analgesic: Secondary | ICD-10-CM

## 2018-03-14 DIAGNOSIS — Z818 Family history of other mental and behavioral disorders: Secondary | ICD-10-CM

## 2018-03-14 DIAGNOSIS — F419 Anxiety disorder, unspecified: Secondary | ICD-10-CM | POA: Diagnosis present

## 2018-03-14 DIAGNOSIS — F1920 Other psychoactive substance dependence, uncomplicated: Secondary | ICD-10-CM | POA: Diagnosis present

## 2018-03-14 DIAGNOSIS — E785 Hyperlipidemia, unspecified: Secondary | ICD-10-CM | POA: Diagnosis present

## 2018-03-14 DIAGNOSIS — Z833 Family history of diabetes mellitus: Secondary | ICD-10-CM

## 2018-03-14 DIAGNOSIS — Z8673 Personal history of transient ischemic attack (TIA), and cerebral infarction without residual deficits: Secondary | ICD-10-CM

## 2018-03-14 DIAGNOSIS — L02519 Cutaneous abscess of unspecified hand: Secondary | ICD-10-CM | POA: Diagnosis present

## 2018-03-14 DIAGNOSIS — Z888 Allergy status to other drugs, medicaments and biological substances status: Secondary | ICD-10-CM

## 2018-03-14 DIAGNOSIS — Z9989 Dependence on other enabling machines and devices: Secondary | ICD-10-CM

## 2018-03-14 DIAGNOSIS — L02511 Cutaneous abscess of right hand: Secondary | ICD-10-CM | POA: Diagnosis present

## 2018-03-14 LAB — COMPREHENSIVE METABOLIC PANEL
ALT: 52 U/L — ABNORMAL HIGH (ref 0–44)
AST: 53 U/L — ABNORMAL HIGH (ref 15–41)
Albumin: 3.4 g/dL — ABNORMAL LOW (ref 3.5–5.0)
Alkaline Phosphatase: 68 U/L (ref 38–126)
Anion gap: 8 (ref 5–15)
BUN: 10 mg/dL (ref 6–20)
CO2: 27 mmol/L (ref 22–32)
Calcium: 8.4 mg/dL — ABNORMAL LOW (ref 8.9–10.3)
Chloride: 100 mmol/L (ref 98–111)
Creatinine, Ser: 0.89 mg/dL (ref 0.61–1.24)
GFR calc Af Amer: >60 mL/min (ref >60)
Glucose, Bld: 116 mg/dL — ABNORMAL HIGH (ref 70–99)
Potassium: 4.1 mmol/L (ref 3.5–5.1)
Sodium: 135 mmol/L (ref 135–145)
TOTAL PROTEIN: 7.3 g/dL (ref 6.5–8.1)
Total Bilirubin: 1 mg/dL (ref 0.3–1.2)

## 2018-03-14 LAB — URINALYSIS, ROUTINE W REFLEX MICROSCOPIC
Bilirubin Urine: NEGATIVE
GLUCOSE, UA: NEGATIVE mg/dL
Hgb urine dipstick: NEGATIVE
Ketones, ur: NEGATIVE mg/dL
Leukocytes, UA: NEGATIVE
Nitrite: NEGATIVE
PH: 5 (ref 5.0–8.0)
PROTEIN: NEGATIVE mg/dL
Specific Gravity, Urine: 1.025 (ref 1.005–1.030)

## 2018-03-14 LAB — CREATININE, SERUM
Creatinine, Ser: 0.86 mg/dL (ref 0.61–1.24)
GFR calc Af Amer: >60 mL/min (ref >60)
GFR calc non Af Amer: >60 mL/min (ref >60)

## 2018-03-14 LAB — CBC WITH DIFFERENTIAL/PLATELET
Abs Immature Granulocytes: 0.03 10*3/uL (ref 0.00–0.07)
Basophils Absolute: 0 10*3/uL (ref 0.0–0.1)
Basophils Relative: 1 %
EOS ABS: 0.2 10*3/uL (ref 0.0–0.5)
Eosinophils Relative: 2 %
HCT: 43.2 % (ref 39.0–52.0)
Hemoglobin: 13.8 g/dL (ref 13.0–17.0)
Immature Granulocytes: 1 %
Lymphocytes Relative: 50 %
Lymphs Abs: 3.2 10*3/uL (ref 0.7–4.0)
MCH: 29.9 pg (ref 26.0–34.0)
MCHC: 31.9 g/dL (ref 30.0–36.0)
MCV: 93.7 fL (ref 80.0–100.0)
Monocytes Absolute: 0.5 10*3/uL (ref 0.1–1.0)
Monocytes Relative: 8 %
Neutro Abs: 2.4 10*3/uL (ref 1.7–7.7)
Neutrophils Relative %: 38 %
Platelets: 161 10*3/uL (ref 150–400)
RBC: 4.61 MIL/uL (ref 4.22–5.81)
RDW: 13.5 % (ref 11.5–15.5)
WBC: 6.3 10*3/uL (ref 4.0–10.5)
nRBC: 0 % (ref 0.0–0.2)

## 2018-03-14 LAB — CBC
HCT: 41.9 % (ref 39.0–52.0)
Hemoglobin: 13.3 g/dL (ref 13.0–17.0)
MCH: 29.5 pg (ref 26.0–34.0)
MCHC: 31.7 g/dL (ref 30.0–36.0)
MCV: 92.9 fL (ref 80.0–100.0)
Platelets: 185 10*3/uL (ref 150–400)
RBC: 4.51 MIL/uL (ref 4.22–5.81)
RDW: 13.4 % (ref 11.5–15.5)
WBC: 6.6 10*3/uL (ref 4.0–10.5)
nRBC: 0 % (ref 0.0–0.2)

## 2018-03-14 LAB — I-STAT TROPONIN, ED: Troponin i, poc: 0 ng/mL (ref 0.00–0.08)

## 2018-03-14 LAB — BRAIN NATRIURETIC PEPTIDE: B Natriuretic Peptide: 18.6 pg/mL (ref 0.0–100.0)

## 2018-03-14 LAB — GLUCOSE, CAPILLARY: Glucose-Capillary: 108 mg/dL — ABNORMAL HIGH (ref 70–99)

## 2018-03-14 LAB — I-STAT CG4 LACTIC ACID, ED: Lactic Acid, Venous: 0.89 mmol/L (ref 0.5–1.9)

## 2018-03-14 MED ORDER — ONDANSETRON HCL 4 MG PO TABS: 4.0000 mg | ORAL_TABLET | Freq: Four times a day (QID) | ORAL | Status: DC | PRN

## 2018-03-14 MED ORDER — OXYCODONE-ACETAMINOPHEN 5-325 MG PO TABS: 2.0000 | ORAL_TABLET | Freq: Once | ORAL | Status: DC

## 2018-03-14 MED ORDER — INSULIN ASPART 100 UNIT/ML ~~LOC~~ SOLN: 0.0000 [IU] | Freq: Every day | SUBCUTANEOUS | Status: DC

## 2018-03-14 MED ORDER — ENSURE ENLIVE PO LIQD: 237.0000 mL | Freq: Two times a day (BID) | ORAL | Status: DC

## 2018-03-14 MED ORDER — SODIUM CHLORIDE 0.9 % IV SOLN
INTRAVENOUS | Status: DC
  Administered 2018-03-14 – 2018-03-15 (×2): via INTRAVENOUS

## 2018-03-14 MED ORDER — ONDANSETRON 4 MG PO TBDP: 4.0000 mg | ORAL_TABLET | Freq: Once | ORAL | Status: DC

## 2018-03-14 MED ORDER — NICOTINE 21 MG/24HR TD PT24
21.0000 mg | MEDICATED_PATCH | Freq: Every day | TRANSDERMAL | Status: DC
  Filled 2018-03-14 (×3): qty 1

## 2018-03-14 MED ORDER — OXYCODONE HCL 5 MG PO TABS
10.0000 mg | ORAL_TABLET | Freq: Once | ORAL | Status: AC
  Administered 2018-03-14: 10 mg via ORAL
  Filled 2018-03-14: qty 2

## 2018-03-14 MED ORDER — INSULIN ASPART 100 UNIT/ML ~~LOC~~ SOLN
0.0000 [IU] | Freq: Three times a day (TID) | SUBCUTANEOUS | Status: DC
  Administered 2018-03-15 (×2): 1 [IU] via SUBCUTANEOUS
  Administered 2018-03-16: 2 [IU] via SUBCUTANEOUS
  Administered 2018-03-16: 1 [IU] via SUBCUTANEOUS
  Administered 2018-03-16: 2 [IU] via SUBCUTANEOUS
  Administered 2018-03-17: 1 [IU] via SUBCUTANEOUS

## 2018-03-14 MED ORDER — MORPHINE SULFATE (PF) 2 MG/ML IV SOLN
2.0000 mg | INTRAVENOUS | Status: DC | PRN
  Administered 2018-03-15 – 2018-03-18 (×12): 2 mg via INTRAVENOUS
  Filled 2018-03-14 (×12): qty 1

## 2018-03-14 MED ORDER — ENOXAPARIN SODIUM 40 MG/0.4ML ~~LOC~~ SOLN
40.0000 mg | SUBCUTANEOUS | Status: DC
  Administered 2018-03-14 – 2018-03-15 (×2): 40 mg via SUBCUTANEOUS
  Filled 2018-03-14 (×2): qty 0.4

## 2018-03-14 MED ORDER — VANCOMYCIN HCL 10 G IV SOLR
2500.0000 mg | Freq: Once | INTRAVENOUS | Status: AC
  Administered 2018-03-14: 2500 mg via INTRAVENOUS
  Filled 2018-03-14: qty 2500

## 2018-03-14 MED ORDER — DOXYCYCLINE HYCLATE 100 MG PO CAPS: 100.0000 mg | ORAL_CAPSULE | Freq: Two times a day (BID) | ORAL | 0 refills | Status: DC

## 2018-03-14 MED ORDER — VANCOMYCIN HCL 10 G IV SOLR
1250.0000 mg | Freq: Three times a day (TID) | INTRAVENOUS | Status: DC
  Administered 2018-03-15 – 2018-03-18 (×10): 1250 mg via INTRAVENOUS
  Filled 2018-03-14 (×13): qty 1250

## 2018-03-14 MED ORDER — ONDANSETRON HCL 4 MG/2ML IJ SOLN: 4.0000 mg | Freq: Four times a day (QID) | INTRAMUSCULAR | Status: DC | PRN

## 2018-03-14 MED ORDER — DOXYCYCLINE HYCLATE 100 MG PO TABS
100.0000 mg | ORAL_TABLET | Freq: Once | ORAL | Status: AC
  Administered 2018-03-14: 100 mg via ORAL
  Filled 2018-03-14: qty 1

## 2018-03-14 MED ORDER — ONDANSETRON 4 MG PO TBDP: ORAL_TABLET | ORAL | 0 refills | Status: DC

## 2018-03-14 MED ORDER — HYDROCODONE-ACETAMINOPHEN 10-325 MG PO TABS
1.0000 | ORAL_TABLET | ORAL | Status: DC | PRN
  Administered 2018-03-15 – 2018-03-18 (×6): 1 via ORAL
  Filled 2018-03-14 (×6): qty 1

## 2018-03-14 NOTE — ED Provider Notes (Signed)
Medical screening examination/treatment/procedure(s) were conducted as a shared visit with non-physician practitioner(s) and myself.  I personally evaluated the patient during the encounter.  None 43 year old morbidly obese male presents with several days of dyspnea and some associated swelling and erythema to his right hand and wrist.  Endorses productive cough.  No fever according to patient.  Does have increased expiratory phase on exam.  Right hand does have some edema and erythema.  Neurovascular intact at his digits.  Work-up pending   Lorre NickAllen, Jaidev Sanger, MD 03/14/18 [phone]

## 2018-03-14 NOTE — H&P (Signed)
History and Physical   Bryan Gallegos GNF:[id number] DOB: 1976/02/12 DOA: 03/14/2018  Referring MD/NP/PA: Dr. Rubin Payor  PCP: Neldon Labella, [place]   Outpatient Specialists: None  Patient coming from: [facility]  Chief Complaint: Right hand pain  HPI: Bryan Gallegos is a 43 y.o. male with medical history significant of bipolar disorder, asthma, hyperlipidemia, morbid obesity, obstructive sleep apnea, polysubstance abuse, previous skin infection and ADHD who was brought in from jail where he went today with right hand swelling tenderness and pain.  Patient is symptoms started about a week ago with a small area at the back of his hand which looked like a boil.  It has since progressed to involve the whole hand and the back.  Swollen tender and painful.  He had low-grade temperature.  Patient also has pain rated as 10 out of 10.  No discharge from the hand.  Denied any injury or animal bite.  Patient appears to have significant cellulitis of his hand.  He was arrested today and taken to jail but brought back to the ER due to the current symptoms.  ED Course: Temperature is 98.1 blood pressure one 616/81 pulse 94 respiratory of 16 his sats 91% room air.  CBC and chemistry all appear to be within normal.  Chest x-ray showed no active findings.  X-ray of the right hand shows severe dorsal soft tissue swelling.  Patient has a linear metallic foreign body seen on the ulnar side of the carpal bones but no fractures.  Review of Systems: As per HPI otherwise 10 point review of systems negative.    Past Medical History:  Diagnosis Date  . Adult ADHD   . Anaphylactic reaction   . Anxiety   . Asthma   . Bipolar disorder (HCC)   . Complication of anesthesia    Per patient difficult intubation;  . Diabetes mellitus   . Difficult intubation    Per patient  . Drug overdose   . Heart valve disorder    s/p echocardiogram  . Hyperlipidemia   . Hypertension   . Morbidly obese (HCC)   . OSA  (obstructive sleep apnea)   . Polysubstance abuse (HCC)   . Transient cerebral ischemia    Unknown    Past Surgical History:  Procedure Laterality Date  . CARPAL TUNNEL RELEASE    . ESOPHAGOGASTRODUODENOSCOPY N/A 04/05/2015   Procedure: ESOPHAGOGASTRODUODENOSCOPY (EGD);  Surgeon: Malissa Hippo, MD;  Location: [facility];  Service: Endoscopy;  Laterality: N/A;  . ESOPHAGOGASTRODUODENOSCOPY (EGD) WITH PROPOFOL N/A 05/21/2015   Procedure: ESOPHAGOGASTRODUODENOSCOPY (EGD) WITH PROPOFOL;  Surgeon: Meryl Dare, MD;  Location: [facility];  Service: Endoscopy;  Laterality: N/A;  . NOSE SURGERY    . TOOTH EXTRACTION    . WISDOM TOOTH EXTRACTION       reports that he has been smoking cigarettes. He has a 11.50 pack-year smoking history. He has never used smokeless tobacco. He reports previous drug use. Drugs: Benzodiazepines and Heroin. He reports that he does not drink alcohol.  Allergies  Allergen Reactions  . Ace Inhibitors Anaphylaxis  . Atenolol Anaphylaxis  . Lisinopril Anaphylaxis  . Prednisone Hives  . Ibuprofen Itching, Swelling and Other (See Comments)    Hand/feet swelling   . Rexulti [Brexpiprazole] Swelling  . Tylenol [Acetaminophen] Other (See Comments)    Reaction:  GI bleeding     Family History  Problem Relation Age of Onset  . CAD Mother        Living  . Diabetes  Mellitus II Mother   . Stroke Mother   . Hypertension Mother   . Congestive Heart Failure Mother   . Kidney disease Mother   . Fibromyalgia Mother   . Thyroid disease Mother   . Hyperlipidemia Mother   . Liver disease Mother   . Alcoholism Father 1935       Deceased  . Arthritis Maternal Grandmother   . Congestive Heart Failure Maternal Grandmother   . Hypertension Maternal Grandmother   . Lung cancer Maternal Grandfather   . Colon cancer Maternal Aunt   . Stomach cancer Maternal Aunt   . Heart disease Other        Paternal & Maternal  . Crohn's disease Other   . Hypertension Other         Paternal & Maternal  . Hypertension Brother        x3  . Hypertension Sister        #1  . Bipolar disorder Sister        #1  . ADD / ADHD Son        x3  . Bipolar disorder Son        x3  . Asperger's syndrome Son      Prior to Admission medications   Medication Sig Start Date End Date Taking? Authorizing Provider  albuterol (PROVENTIL HFA;VENTOLIN HFA) 108 (90 Base) MCG/ACT inhaler Inhale 1-2 puffs into the lungs every 6 (six) hours as needed for wheezing or shortness of breath. 02/10/17   Armandina StammerNwoko, Agnes I, NP  amphetamine-dextroamphetamine (ADDERALL) 30 MG tablet Take 60 mg by mouth daily.    [provider]  atorvastatin (LIPITOR) 20 MG tablet Take 1 tablet (20 mg total) by mouth at bedtime. For high cholesterol 02/10/17   Armandina StammerNwoko, Agnes I, NP  Carboxymethylcellulose Sodium (EYE DROPS OP) Apply 2 drops to eye every 3 (three) hours as needed (dry eyes).    [provider]  clonazePAM (KLONOPIN) 1 MG tablet Take 1 mg by mouth 3 (three) times daily as needed for anxiety (panic attacks).  05/28/17   [provider]  cloNIDine (CATAPRES) 0.2 MG tablet Take 0.2 mg by mouth 2 (two) times daily. 12/06/17   [provider]  COMBIVENT RESPIMAT 20-100 MCG/ACT AERS respimat Inhale 1 puff into the lungs every 6 (six) hours as needed for wheezing or shortness of breath. Patient not taking: Reported on 03/14/2018 02/10/17   Armandina StammerNwoko, Agnes I, NP  doxycycline (VIBRAMYCIN) 100 MG capsule Take 1 capsule (100 mg total) by mouth 2 (two) times daily. One po bid x 7 days 03/14/18   Dartha LodgeFord, Kelsey N, PA-C  Fluticasone-Salmeterol (ADVAIR) 100-50 MCG/DOSE AEPB Inhale 2 puffs into the lungs 2 (two) times daily.    [provider]  gabapentin (NEURONTIN) 400 MG capsule Take 2 capsules (800 mg total) by mouth 3 (three) times daily. For agitation/Neuropathy 02/10/17   Armandina StammerNwoko, Agnes I, NP  hydrochlorothiazide (HYDRODIURIL) 25 MG tablet Take 25 mg by mouth daily. 12/06/17   [provider]  hydrOXYzine (ATARAX/VISTARIL) 25 MG tablet Take 1 tablet (25 mg total) by mouth every 6 (six) hours as needed for anxiety. Patient not taking: Reported on 03/14/2018 02/10/17   Armandina StammerNwoko, Agnes I, NP  hydrOXYzine (VISTARIL) 25 MG capsule Take 25 mg by mouth 2 (two) times daily as needed. 12/14/17   [provider]  Ledipasvir-Sofosbuvir (HARVONI) 90-400 MG TABS Take 1 tablet by mouth daily. 01/27/18   Kuppelweiser, Cassie L, RPH-CPP  metFORMIN (GLUCOPHAGE) 1000 MG tablet  Take 1,000 mg by mouth daily. 12/08/17   [provider]  Multiple Vitamins-Minerals (DRY EYE FORMULA PO) Take 2 drops by mouth every 8 (eight) hours.    [provider]  naproxen (NAPROSYN) 375 MG tablet Take 1 tablet (375 mg total) by mouth 2 (two) times daily. Patient not taking: Reported on 03/14/2018 06/27/17   Felicie Morn, NP  omeprazole (PRILOSEC) 40 MG capsule Take 1 capsule (40 mg total) by mouth 2 (two) times daily. For acid reflux 02/10/17   Armandina Stammer I, NP  ondansetron (ZOFRAN ODT) 4 MG disintegrating tablet 4mg  ODT q4 hours prn nausea/vomit 03/14/18   Dartha Lodge, PA-C  Oxcarbazepine (TRILEPTAL) 300 MG tablet Take 300 mg by mouth daily. 12/14/17   [provider]  oxyCODONE (ROXICODONE) 15 MG immediate release tablet Take 15 mg by mouth every 6 (six) hours as needed (severe chronic back pain).  06/22/17   [provider]  oxyCODONE-acetaminophen (PERCOCET/ROXICET) 5-325 MG tablet Take 1 tablet by mouth 3 (three) times daily as needed. 05/23/17   [provider]  polyethylene glycol powder (MIRALAX) powder Take 17 g by mouth daily. For constipation Patient not taking: Reported on 03/14/2018 02/10/17   Armandina Stammer I, NP  sitaGLIPtin-metformin (JANUMET) 50-500 MG tablet Take 1 tablet by mouth daily.    [provider]  tiZANidine (ZANAFLEX) 4 MG capsule Take 4 mg by mouth 3 (three) times daily as needed for muscle spasms. 04/23/17   [provider]  traZODone (DESYREL) 100 MG tablet Take 200 mg by mouth at bedtime.  05/28/17   [provider]  ursodiol (ACTIGALL) 300 MG capsule Take 1 capsule (300 mg total) by mouth 2 (two) times daily. For kidney stone Patient not taking: Reported on 03/14/2018 02/10/17   Armandina Stammer I, NP  ziprasidone (GEODON) 80 MG capsule Take 80 mg by mouth 2 (two) times daily with a meal.  05/28/17   [provider]  lisinopril (PRINIVIL,ZESTRIL) 40 MG tablet Take 1 tablet (40 mg total) by mouth 2 (two) times daily. For high blood pressure 09/05/15 10/01/15  Armandina Stammer I, NP    Physical Exam: Vitals:   03/14/18 1654 03/14/18 2015  BP: (!) 161/81 (!) 141/76  Pulse: 94 83  Resp: 16 16  Temp: 98 F (36.7 C) 97.9 F (36.6 C)  TempSrc: Oral   SpO2: 99% 99%  Weight: (!) 165.6 kg   Height: 6\' 2"  (1.88 m)       Constitutional: NAD, calm, comfortable Vitals:   03/14/18 1654 03/14/18 2015  BP: (!) 161/81 (!) 141/76  Pulse: 94 83  Resp: 16 16  Temp: 98 F (36.7 C) 97.9 F (36.6 C)  TempSrc: Oral   SpO2: 99% 99%  Weight: (!) 165.6 kg   Height: 6\' 2"  (1.88 m)    Eyes: PERRL, lids and conjunctivae normal ENMT: Mucous membranes are moist. Posterior pharynx clear of any exudate or lesions.Normal dentition.  Neck: normal, supple, no masses, no thyromegaly Respiratory: clear to auscultation bilaterally, no wheezing, no crackles. Normal respiratory effort. No accessory muscle use.  Cardiovascular: Regular rate and rhythm, no murmurs / rubs / gallops. No extremity edema. 2+ pedal pulses. No carotid bruits.  Abdomen: no tenderness, no masses palpated. No hepatosplenomegaly. Bowel sounds positive.  Musculoskeletal: Right hand swollen, tender, red, multiple areas of carbuncles, normal muscle tone.  Skin: no rashes, lesions, ulcers. No induration Neurologic: CN 2-12 grossly intact. Sensation intact, DTR normal. Strength 5/5 in all 4.  Psychiatric: Normal  judgment and insight. Alert and  oriented x 3. Normal mood.     Labs on Admission: I have personally reviewed following labs and imaging studies  CBC: Recent Labs  Lab 03/14/18 1152  WBC 6.3  NEUTROABS 2.4  HGB 13.8  HCT 43.2  MCV 93.7  PLT 161   Basic Metabolic Panel: Recent Labs  Lab 03/14/18 1152  NA 135  K 4.1  CL 100  CO2 27  GLUCOSE 116*  BUN 10  CREATININE 0.89  CALCIUM 8.4*   GFR: Estimated Creatinine Clearance: 173.2 mL/min (by C-G formula based on SCr of 0.89 mg/dL). Liver Function Tests: Recent Labs  Lab 03/14/18 1152  AST 53*  ALT 52*  ALKPHOS 68  BILITOT 1.0  PROT 7.3  ALBUMIN 3.4*   No results for input(s): LIPASE, AMYLASE in the last 168 hours. No results for input(s): AMMONIA in the last 168 hours. Coagulation Profile: No results for input(s): INR, PROTIME in the last 168 hours. Cardiac Enzymes: No results for input(s): CKTOTAL, CKMB, CKMBINDEX, TROPONINI in the last 168 hours. BNP (last 3 results) No results for input(s): PROBNP in the last 8760 hours. HbA1C: No results for input(s): HGBA1C in the last 72 hours. CBG: No results for input(s): GLUCAP in the last 168 hours. Lipid Profile: No results for input(s): CHOL, HDL, LDLCALC, TRIG, CHOLHDL, LDLDIRECT in the last 72 hours. Thyroid Function Tests: No results for input(s): TSH, T4TOTAL, FREET4, T3FREE, THYROIDAB in the last 72 hours. Anemia Panel: No results for input(s): VITAMINB12, FOLATE, FERRITIN, TIBC, IRON, RETICCTPCT in the last 72 hours. Urine analysis:    Component Value Date/Time   COLORURINE AMBER (A) 03/14/2018 1136   APPEARANCEUR CLEAR 03/14/2018 1136   LABSPEC 1.025 03/14/2018 1136   PHURINE 5.0 03/14/2018 1136   GLUCOSEU NEGATIVE 03/14/2018 1136   HGBUR NEGATIVE 03/14/2018 1136   BILIRUBINUR NEGATIVE 03/14/2018 1136   KETONESUR NEGATIVE 03/14/2018 1136   PROTEINUR NEGATIVE 03/14/2018 1136   UROBILINOGEN 1.0 08/18/2014 2002   NITRITE NEGATIVE 03/14/2018 1136   LEUKOCYTESUR NEGATIVE 03/14/2018  1136   Sepsis Labs: @LABRCNTIP (procalcitonin:4,lacticidven:4) )No results found for this or any previous visit (from the past 240 hour(s)).   Radiological Exams on Admission: Dg Chest 2 View  Result Date: 03/14/2018 CLINICAL DATA:  Shortness of breath. EXAM: CHEST - 2 VIEW COMPARISON:  Radiographs of May 25, 2017. FINDINGS: The heart size and mediastinal contours are within normal limits. Both lungs are clear. No pneumothorax or pleural effusion is noted. The visualized skeletal structures are unremarkable. IMPRESSION: No active cardiopulmonary disease. Electronically Signed   By: Lupita Raider, M.D.   On: 03/14/2018 12:17   Dg Hand Complete Right  Result Date: 03/14/2018 CLINICAL DATA:  Right hand infection. EXAM: RIGHT HAND - COMPLETE 3+ VIEW COMPARISON:  Radiographs of June 27, 2017. FINDINGS: There is no evidence of fracture or dislocation. There is no evidence of arthropathy or other focal bone abnormality. Stable linear metallic foreign body seen in the soft tissues on the ulnar side of the carpal bones. Severe dorsal soft tissue swelling is noted concerning for infection. IMPRESSION: Severe dorsal soft tissue swelling is noted concerning for infection. Stable appearance of linear metallic foreign body seen on the ulnar side of the carpal bones. No fracture or bony abnormality is noted. Electronically Signed   By: Lupita Raider, M.D.   On: 03/14/2018 12:20      Assessment/Plan Principal Problem:   Cellulitis of hand, right Active Problems:   HLD (hyperlipidemia)  OSA on CPAP   Gastroesophageal reflux disease with esophagitis   Diabetes mellitus type II, controlled (HCC)   Morbid obesity (HCC)   Tobacco use disorder   Polysubstance dependence including opioid type drug, episodic abuse (HCC)   Hepatitis C infection   MDD (major depressive disorder), recurrent severe, without psychosis (HCC)     #1 cellulitis of the right hand: Patient has severe cellulitis with fear for  possible developing abscess.  He is a diabetic.   We will initiate IV vancomycin.  Follow culture and sensitivity results.  Monitor patient and if abscess develops get surgical consultation.  #2 obstructive sleep apnea: Patient uses CPAP at home and will provide.  #3 diabetes: Not on any home regimen.  Initiate sliding scale insulin.  #4 morbid obesity: Dietary counseling.  #5 hyperlipidemia: Continue with home regimen.  #6 polysubstance abuse: Including tobacco.  Nicotine patch will be added.  #7 depression with anxiety: We will keep patient on home regimen.  It appears she is not taking anything at the moment however.           DVT prophylaxis: Heparin Code Status: Full code Family Communication: No family available discussed care with patient Disposition Plan: Back to jail Consults called: None Admission status: Inpatient  Severity of Illness: The appropriate patient status for this patient is INPATIENT. Inpatient status is judged to be reasonable and necessary in order to provide the required intensity of service to ensure the patient's safety. The patient's presenting symptoms, physical exam findings, and initial radiographic and laboratory data in the context of their chronic comorbidities is felt to place them at high risk for further clinical deterioration. Furthermore, it is not anticipated that the patient will be medically stable for discharge from the hospital within 2 midnights of admission. The following factors support the patient status of inpatient.   " The patient's presenting symptoms include right hand swelling and pain. " The worrisome physical exam findings include redness and tenderness of the right hand. " The initial radiographic and laboratory data are worrisome because of x-ray shows no evidence of abscess. " The chronic co-morbidities include morbid obesity with reported diabetes.   * I certify that at the point of admission it is my clinical judgment that  the patient will require inpatient hospital care spanning beyond 2 midnights from the point of admission due to high intensity of service, high risk for further deterioration and high frequency of surveillance required.Lonia Blood*    GARBA,LAWAL MD [facility] Pager [phone]  If 7PM-7AM, please contact night-coverage [email or web address] Password [email or web address]  03/14/2018, 9:51 PM

## 2018-03-14 NOTE — ED Triage Notes (Signed)
Pt having increased erythema and drainage from right hand infection since seen here earlier. Pt reports he was started on antibiotics.

## 2018-03-14 NOTE — ED Notes (Signed)
Bed: WA01 Expected date:  Expected time:  Means of arrival:  Comments: EMS 42yo hand infection, cellulitis?

## 2018-03-14 NOTE — Progress Notes (Signed)
Pt arrived to floor from ED A&Ox4; no distress noted. Pt in good spirits. Officers will remain with patient during stay here at this facility. Pt complained of 9/10 pain in right hand. MD aware. Will continue to monitor.

## 2018-03-14 NOTE — ED Provider Notes (Signed)
[facility]-EMERGENCY DEPT Provider Note   CSN: [id number] Arrival date & time: 03/14/18  1644     History   Chief Complaint Chief Complaint  Patient presents with  . Recurrent Skin Infections    HPI Bryan Gallegos is a 43 y.o. male.  HPI Patient presents with right hand infection.  Seen earlier today for same.  Also had some shortness of breath.  Initially had told providers earlier today he was not on any antibiotics.  Patient states however he did not see this.  States that he told him that he has not been prescribed any for this but is been taking clindamycin for the last week.  States is been worse for the last 3 days.  States his hand is gotten worse and now draining.  Now [facility] from jail.  States his hand is more swollen more painful.  States he needs IV antibiotics.  Patient states that he was sent here because the doctor in jail could not manage this.  He also states that he was sent here because they did not want an infection there.  Patient also states he was sent here because he needs IV antibiotics. Past Medical History:  Diagnosis Date  . Adult ADHD   . Anaphylactic reaction   . Anxiety   . Asthma   . Bipolar disorder (HCC)   . Complication of anesthesia    Per patient difficult intubation;  . Diabetes mellitus   . Difficult intubation    Per patient  . Drug overdose   . Heart valve disorder    s/p echocardiogram  . Hyperlipidemia   . Hypertension   . Morbidly obese (HCC)   . OSA (obstructive sleep apnea)   . Polysubstance abuse (HCC)   . Transient cerebral ischemia    Unknown    Patient Active Problem List   Diagnosis Date Noted  . 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 Swelling of right hand 06/27/2017  . Bipolar affective disorder (HCC)   . Vertebral osteomyelitis (HCC) 05/01/2017  . 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 Opioid use disorder (HCC)   . Hepatitis C infection   . History  of suicide attempt 10/17/2016  . Chronic pain syndrome 10/22/2015  . Polysubstance dependence including opioid type drug, episodic abuse (HCC) 09/04/2015  . Cholelithiasis 05/17/2015  . Antisocial personality disorder (HCC) 04/11/2015  . Benzodiazepine dependence (HCC) 04/11/2015  . Tobacco use disorder 04/10/2015  . Gastroesophageal reflux disease with esophagitis 01/28/2014  . Diabetes mellitus type II, controlled (HCC) 01/28/2014  . Morbid obesity (HCC) 01/28/2014  . TIA (transient ischemic attack) 09/02/2013  . HLD (hyperlipidemia) 08/06/2013  . OSA on CPAP 08/06/2013    Past Surgical History:  Procedure Laterality Date  . CARPAL TUNNEL RELEASE    . ESOPHAGOGASTRODUODENOSCOPY N/A 04/05/2015   Procedure: ESOPHAGOGASTRODUODENOSCOPY (EGD);  Surgeon: Malissa Hippo, MD;  Location: [facility];  Service: Endoscopy;  Laterality: N/A;  . ESOPHAGOGASTRODUODENOSCOPY (EGD) WITH PROPOFOL N/A 05/21/2015   Procedure: ESOPHAGOGASTRODUODENOSCOPY (EGD) WITH PROPOFOL;  Surgeon: Meryl Dare, MD;  Location: [facility];  Service: Endoscopy;  Laterality: N/A;  . NOSE SURGERY    . TOOTH EXTRACTION    . WISDOM TOOTH EXTRACTION          Home Medications    Prior to Admission medications   Medication Sig Start Date End Date Taking? Authorizing Provider  albuterol (PROVENTIL HFA;VENTOLIN HFA) 108 (90 Base) MCG/ACT inhaler Inhale 1-2 puffs into the lungs every  6 (six) hours as needed for wheezing or shortness of breath. 02/10/17   Armandina Stammer I, NP  amphetamine-dextroamphetamine (ADDERALL) 30 MG tablet Take 60 mg by mouth daily.    [provider]  atorvastatin (LIPITOR) 20 MG tablet Take 1 tablet (20 mg total) by mouth at bedtime. For high cholesterol 02/10/17   Armandina Stammer I, NP  Carboxymethylcellulose Sodium (EYE DROPS OP) Apply 2 drops to eye every 3 (three) hours as needed (dry eyes).    [provider]  clonazePAM (KLONOPIN) 1 MG tablet Take 1 mg by mouth 3 (three)  times daily as needed for anxiety (panic attacks).  05/28/17   [provider]  cloNIDine (CATAPRES) 0.2 MG tablet Take 0.2 mg by mouth 2 (two) times daily. 12/06/17   [provider]  COMBIVENT RESPIMAT 20-100 MCG/ACT AERS respimat Inhale 1 puff into the lungs every 6 (six) hours as needed for wheezing or shortness of breath. Patient not taking: Reported on 03/14/2018 02/10/17   Armandina Stammer I, NP  doxycycline (VIBRAMYCIN) 100 MG capsule Take 1 capsule (100 mg total) by mouth 2 (two) times daily. One po bid x 7 days 03/14/18   Dartha Lodge, PA-C  Fluticasone-Salmeterol (ADVAIR) 100-50 MCG/DOSE AEPB Inhale 2 puffs into the lungs 2 (two) times daily.    [provider]  gabapentin (NEURONTIN) 400 MG capsule Take 2 capsules (800 mg total) by mouth 3 (three) times daily. For agitation/Neuropathy 02/10/17   Armandina Stammer I, NP  hydrochlorothiazide (HYDRODIURIL) 25 MG tablet Take 25 mg by mouth daily. 12/06/17   [provider]  hydrOXYzine (ATARAX/VISTARIL) 25 MG tablet Take 1 tablet (25 mg total) by mouth every 6 (six) hours as needed for anxiety. Patient not taking: Reported on 03/14/2018 02/10/17   Armandina Stammer I, NP  hydrOXYzine (VISTARIL) 25 MG capsule Take 25 mg by mouth 2 (two) times daily as needed. 12/14/17   [provider]  Ledipasvir-Sofosbuvir (HARVONI) 90-400 MG TABS Take 1 tablet by mouth daily. 01/27/18   Kuppelweiser, Cassie L, RPH-CPP  metFORMIN (GLUCOPHAGE) 1000 MG tablet Take 1,000 mg by mouth daily. 12/08/17   [provider]  Multiple Vitamins-Minerals (DRY EYE FORMULA PO) Take 2 drops by mouth every 8 (eight) hours.    [provider]  naproxen (NAPROSYN) 375 MG tablet Take 1 tablet (375 mg total) by mouth 2 (two) times daily. Patient not taking: Reported on 03/14/2018 06/27/17   Felicie Morn, NP  omeprazole (PRILOSEC) 40 MG capsule Take 1 capsule (40 mg total) by mouth 2 (two) times daily. For acid reflux 02/10/17   Armandina Stammer I, NP  ondansetron (ZOFRAN ODT) 4 MG disintegrating tablet 4mg  ODT q4 hours prn nausea/vomit 03/14/18   Dartha Lodge, PA-C  Oxcarbazepine (TRILEPTAL) 300 MG tablet Take 300 mg by mouth daily. 12/14/17   [provider]  oxyCODONE (ROXICODONE) 15 MG immediate release tablet Take 15 mg by mouth every 6 (six) hours as needed (severe chronic back pain).  06/22/17   [provider]  oxyCODONE-acetaminophen (PERCOCET/ROXICET) 5-325 MG tablet Take 1 tablet by mouth 3 (three) times daily as needed. 05/23/17   [provider]  polyethylene glycol powder (MIRALAX) powder Take 17 g by mouth daily. For constipation Patient not taking: Reported on 03/14/2018 02/10/17   Armandina Stammer I, NP  sitaGLIPtin-metformin (JANUMET) 50-500 MG tablet Take 1 tablet by mouth daily.    [provider]  tiZANidine (ZANAFLEX) 4 MG capsule Take 4 mg by mouth 3 (three) times  daily as needed for muscle spasms. 04/23/17   [provider]  traZODone (DESYREL) 100 MG tablet Take 200 mg by mouth at bedtime.  05/28/17   [provider]  ursodiol (ACTIGALL) 300 MG capsule Take 1 capsule (300 mg total) by mouth 2 (two) times daily. For kidney stone Patient not taking: Reported on 03/14/2018 02/10/17   Armandina StammerNwoko, Agnes I, NP  ziprasidone (GEODON) 80 MG capsule Take 80 mg by mouth 2 (two) times daily with a meal.  05/28/17   [provider]  lisinopril (PRINIVIL,ZESTRIL) 40 MG tablet Take 1 tablet (40 mg total) by mouth 2 (two) times daily. For high blood pressure 09/05/15 10/01/15  Sanjuana KavaNwoko, Agnes I, NP    Family History Family History  Problem Relation Age of Onset  . CAD Mother        Living  . Diabetes Mellitus II Mother   . Stroke Mother   . Hypertension Mother   . Congestive Heart Failure Mother   . Kidney disease Mother   . Fibromyalgia Mother   . Thyroid disease Mother   . Hyperlipidemia Mother   . Liver disease Mother   . Alcoholism Father 1335       Deceased  .  Arthritis Maternal Grandmother   . Congestive Heart Failure Maternal Grandmother   . Hypertension Maternal Grandmother   . Lung cancer Maternal Grandfather   . Colon cancer Maternal Aunt   . Stomach cancer Maternal Aunt   . Heart disease Other        Paternal & Maternal  . Crohn's disease Other   . Hypertension Other        Paternal & Maternal  . Hypertension Brother        x3  . Hypertension Sister        #1  . Bipolar disorder Sister        #1  . ADD / ADHD Son        x3  . Bipolar disorder Son        x3  . Asperger's syndrome Son     Social History Social History   Tobacco Use  . Smoking status: Current Every Day Smoker    Packs/day: 0.50    Years: 23.00    Pack years: 11.50    Types: Cigarettes  . Smokeless tobacco: Never Used  Substance Use Topics  . Alcohol use: No    Alcohol/week: 0.0 standard drinks  . Drug use: Not Currently    Types: Benzodiazepines, Heroin     Allergies   Ace inhibitors; Atenolol; Lisinopril; Prednisone; Ibuprofen; Rexulti [brexpiprazole]; and Tylenol [acetaminophen]   Review of Systems Review of Systems  Constitutional: Negative for fever.  HENT: Negative for congestion.   Respiratory: Positive for cough and shortness of breath.   Cardiovascular: Negative for chest pain.  Gastrointestinal: Negative for abdominal pain.  Genitourinary: Negative for flank pain.  Musculoskeletal: Negative for joint swelling.       Right hand swelling.  Skin: Positive for wound.  Neurological: Negative for seizures and weakness.  Hematological: Negative for adenopathy.  Psychiatric/Behavioral: Negative for confusion.     Physical Exam Updated Vital Signs BP (!) 161/81 (BP Location: Right Arm)   Pulse 94   Temp 98 F (36.7 C) (Oral)   Resp 16   Ht 6\' 2"  (1.88 m)   Wt (!) 165.6 kg   SpO2 99%   BMI 46.86 kg/m   Physical Exam HENT:     Head: Normocephalic.  Cardiovascular:  Rate and Rhythm: Regular rhythm.  Pulmonary:     Effort:  Pulmonary effort is normal.  Abdominal:     Tenderness: There is no abdominal tenderness.  Skin:    General: Skin is warm.     Comments: Erythema to right hand.  Still within demarcated areas.  There is swelling.  Patient will not do much flexion or extension of the fingers.  There is an area on the lateral dorsal aspect of the hand with some purulent drainage.  No fluctuance.  Neurological:     General: No focal deficit present.     Mental Status: He is alert.      ED Treatments / Results  Labs (all labs ordered are listed, but only abnormal results are displayed) Labs Reviewed - No data to display  EKG None  Radiology Dg Chest 2 View  Result Date: 03/14/2018 CLINICAL DATA:  Shortness of breath. EXAM: CHEST - 2 VIEW COMPARISON:  Radiographs of May 25, 2017. FINDINGS: The heart size and mediastinal contours are within normal limits. Both lungs are clear. No pneumothorax or pleural effusion is noted. The visualized skeletal structures are unremarkable. IMPRESSION: No active cardiopulmonary disease. Electronically Signed   By: Lupita RaiderJames  Green Jr, M.D.   On: 03/14/2018 12:17   Dg Hand Complete Right  Result Date: 03/14/2018 CLINICAL DATA:  Right hand infection. EXAM: RIGHT HAND - COMPLETE 3+ VIEW COMPARISON:  Radiographs of June 27, 2017. FINDINGS: There is no evidence of fracture or dislocation. There is no evidence of arthropathy or other focal bone abnormality. Stable linear metallic foreign body seen in the soft tissues on the ulnar side of the carpal bones. Severe dorsal soft tissue swelling is noted concerning for infection. IMPRESSION: Severe dorsal soft tissue swelling is noted concerning for infection. Stable appearance of linear metallic foreign body seen on the ulnar side of the carpal bones. No fracture or bony abnormality is noted. Electronically Signed   By: Lupita RaiderJames  Green Jr, M.D.   On: 03/14/2018 12:20    Procedures Procedures (including critical care time)  Medications  Ordered in ED Medications - No data to display   Initial Impression / Assessment and Plan / ED Course  I have reviewed the triage vital signs and the nursing notes.  Pertinent labs & imaging results that were available during my care of the patient were reviewed by me and considered in my medical decision making (see chart for details).     Patient with cellulitis of right hand.  Swelling.  Labs reviewed from earlier today with normal white count, at that time patient reportedly had said he was not on antibiotics but now states he has been on clindamycin for a week.  States that jail cannot keep him.  However the officer does not know about this.  Patient has changed his story from previously and outpatient management was can be done because he had not been on antibiotics, however if he is on antibiotics and has had worsening of the swelling along with previous MRSA and previous severe infections patient would require admission at this time.  Will discuss with hospitalist. Does not appear to be a clear abscess at this time but does need following.  Final Clinical Impressions(s) / ED Diagnoses   Final diagnoses:  Cellulitis of hand    ED Discharge Orders    None       Benjiman CorePickering, Malley Hauter, MD 03/14/18 1816

## 2018-03-14 NOTE — Discharge Instructions (Addendum)
Please take doxycycline twice daily for hand infection, you can elevate the hand to help with swelling. Zofran as needed for nausea. If redness moves outside of the line demarcated here in the emergency department today or redness is not improving with antibiotics it is extremely important that you return to the emergency department.  Work-up regarding her shortness of breath is reassuring as well, your chest x-ray is clear and labs are overall reassuring and do not suggest an acute problem with your heart or lungs.  If you develop fevers, worsening shortness of breath or chest pain, persistent vomiting, severe abdominal pain or any other new or concerning symptoms return to the emergency department for reevaluation.

## 2018-03-14 NOTE — ED Provider Notes (Signed)
[facility]-EMERGENCY DEPT Provider Note   CSN: [id number] Arrival date & time: 03/14/18  4098     History   Chief Complaint Chief Complaint  Patient presents with  . Hand Pain  . Shortness of Breath    HPI Bryan Gallegos is a 43 y.o. male.  HPI   Bryan Gallegos is a 43 y.o. male with a history of morbid obesity, diabetes, hypertension, hyperlipidemia, heart murmur, polysubstance abuse, asthma, depression, bipolar disorder and anxiety, who presents to the emergency department for evaluation of 3 to 4 days of shortness of breath as well as pain and swelling over the right hand with some associated redness.  Patient reports shortness of breath is present constantly but worse with exertion.  He also endorses a intermittent productive cough but has not had any fevers or chills.  He denies any associated chest pain.  He has had some swelling in both of his hands, right worse than left but denies any lower extremity swelling.  Does not take a fluid pill.  He also reports that he has had a few episodes of vomiting over the past few days, no active vomiting since arriving here in the emergency department denies any localized abdominal pain, no diarrhea, melena or hematochezia.  He denies any urinary symptoms.  Patient reports that over the past 3 days his right hand has become red and warm to the touch with worsening pain and swelling.  He is unsure of any focal cut or injury to the area, tetanus is up-to-date.  Reports some mild swelling in the left hand without pain.  Past Medical History:  Diagnosis Date  . Adult ADHD   . Anaphylactic reaction   . Anxiety   . Asthma   . Bipolar disorder (HCC)   . Complication of anesthesia    Per patient difficult intubation;  . Diabetes mellitus   . Difficult intubation    Per patient  . Drug overdose   . Heart valve disorder    s/p echocardiogram  . Hyperlipidemia   . Hypertension   . Morbidly obese (HCC)   . OSA  (obstructive sleep apnea)   . Polysubstance abuse (HCC)   . Transient cerebral ischemia    Unknown    Patient Active Problem List   Diagnosis Date Noted  . 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 Swelling of right hand 06/27/2017  . Bipolar affective disorder (HCC)   . Vertebral osteomyelitis (HCC) 05/01/2017  . 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 Opioid use disorder (HCC)   . Hepatitis C infection   . History of suicide attempt 10/17/2016  . Chronic pain syndrome 10/22/2015  . Polysubstance dependence including opioid type drug, episodic abuse (HCC) 09/04/2015  . Cholelithiasis 05/17/2015  . Antisocial personality disorder (HCC) 04/11/2015  . Benzodiazepine dependence (HCC) 04/11/2015  . Tobacco use disorder 04/10/2015  . Gastroesophageal reflux disease with esophagitis 01/28/2014  . Diabetes mellitus type II, controlled (HCC) 01/28/2014  . Morbid obesity (HCC) 01/28/2014  . TIA (transient ischemic attack) 09/02/2013  . HLD (hyperlipidemia) 08/06/2013  . OSA on CPAP 08/06/2013    Past Surgical History:  Procedure Laterality Date  . CARPAL TUNNEL RELEASE    . ESOPHAGOGASTRODUODENOSCOPY N/A 04/05/2015   Procedure: ESOPHAGOGASTRODUODENOSCOPY (EGD);  Surgeon: Malissa Hippo, MD;  Location: [facility];  Service: Endoscopy;  Laterality: N/A;  . ESOPHAGOGASTRODUODENOSCOPY (EGD) WITH PROPOFOL N/A 05/21/2015   Procedure: ESOPHAGOGASTRODUODENOSCOPY (EGD) WITH PROPOFOL;  Surgeon: Meryl Dare, MD;  Location: [facility] ENDOSCOPY;  Service: Endoscopy;  Laterality: N/A;  . NOSE SURGERY    . TOOTH EXTRACTION    . WISDOM TOOTH EXTRACTION          Home Medications    Prior to Admission medications   Medication Sig Start Date End Date Taking? Authorizing Provider  albuterol (PROVENTIL HFA;VENTOLIN HFA) 108 (90 Base) MCG/ACT inhaler Inhale 1-2 puffs into the lungs every 6 (six) hours as needed for wheezing or shortness of breath. 02/10/17  Yes Armandina Stammer I, NP  amphetamine-dextroamphetamine (ADDERALL) 30 MG tablet Take 60 mg by mouth daily.   Yes [provider]  atorvastatin (LIPITOR) 20 MG tablet Take 1 tablet (20 mg total) by mouth at bedtime. For high cholesterol 02/10/17  Yes Nwoko, Nicole Kindred I, NP  Carboxymethylcellulose Sodium (EYE DROPS OP) Apply 2 drops to eye every 3 (three) hours as needed (dry eyes).   Yes [provider]  clonazePAM (KLONOPIN) 1 MG tablet Take 1 mg by mouth 3 (three) times daily as needed for anxiety (panic attacks).  05/28/17  Yes [provider]  cloNIDine (CATAPRES) 0.2 MG tablet Take 0.2 mg by mouth 2 (two) times daily. 12/06/17  Yes [provider]  Fluticasone-Salmeterol (ADVAIR) 100-50 MCG/DOSE AEPB Inhale 2 puffs into the lungs 2 (two) times daily.   Yes [provider]  gabapentin (NEURONTIN) 400 MG capsule Take 2 capsules (800 mg total) by mouth 3 (three) times daily. For agitation/Neuropathy 02/10/17  Yes Armandina Stammer I, NP  hydrochlorothiazide (HYDRODIURIL) 25 MG tablet Take 25 mg by mouth daily. 12/06/17  Yes [provider]  hydrOXYzine (VISTARIL) 25 MG capsule Take 25 mg by mouth 2 (two) times daily as needed. 12/14/17  Yes [provider]  Ledipasvir-Sofosbuvir (HARVONI) 90-400 MG TABS Take 1 tablet by mouth daily. 01/27/18  Yes Kuppelweiser, Cassie L, RPH-CPP  metFORMIN (GLUCOPHAGE) 1000 MG tablet Take 1,000 mg by mouth daily. 12/08/17  Yes [provider]  Multiple Vitamins-Minerals (DRY EYE FORMULA PO) Take 2 drops by mouth every 8 (eight) hours.   Yes [provider]  omeprazole (PRILOSEC) 40 MG capsule Take 1 capsule (40 mg total) by mouth 2 (two) times daily. For acid reflux 02/10/17  Yes Nwoko, Nicole Kindred I, NP  Oxcarbazepine (TRILEPTAL) 300 MG tablet Take 300 mg by mouth daily. 12/14/17  Yes [provider]  oxyCODONE (ROXICODONE) 15 MG immediate release tablet Take 15 mg by mouth every 6 (six) hours as needed  (severe chronic back pain).  06/22/17  Yes [provider]  oxyCODONE-acetaminophen (PERCOCET/ROXICET) 5-325 MG tablet Take 1 tablet by mouth 3 (three) times daily as needed. 05/23/17  Yes [provider]  sitaGLIPtin-metformin (JANUMET) 50-500 MG tablet Take 1 tablet by mouth daily.   Yes [provider]  tiZANidine (ZANAFLEX) 4 MG capsule Take 4 mg by mouth 3 (three) times daily as needed for muscle spasms. 04/23/17  Yes [provider]  traZODone (DESYREL) 100 MG tablet Take 200 mg by mouth at bedtime.  05/28/17  Yes [provider]  ziprasidone (GEODON) 80 MG capsule Take 80 mg by mouth 2 (two) times daily with a meal.  05/28/17  Yes [provider]  COMBIVENT RESPIMAT 20-100 MCG/ACT AERS respimat Inhale 1 puff into the lungs every 6 (six) hours as needed for wheezing or shortness of breath. Patient not taking: Reported on 03/14/2018 02/10/17   Armandina Stammer I, NP  doxycycline (VIBRAMYCIN) 100 MG capsule Take 1 capsule (100 mg total) by mouth 2 (  two) times daily. One po bid x 7 days 03/14/18   Dartha Lodge, PA-C  hydrOXYzine (ATARAX/VISTARIL) 25 MG tablet Take 1 tablet (25 mg total) by mouth every 6 (six) hours as needed for anxiety. Patient not taking: Reported on 03/14/2018 02/10/17   Armandina Stammer I, NP  naproxen (NAPROSYN) 375 MG tablet Take 1 tablet (375 mg total) by mouth 2 (two) times daily. Patient not taking: Reported on 03/14/2018 06/27/17   Felicie Morn, NP  ondansetron [facility] ODT) 4 MG disintegrating tablet 4mg  ODT q4 hours prn nausea/vomit 03/14/18   Dartha Lodge, PA-C  polyethylene glycol powder [facility]) powder Take 17 g by mouth daily. For constipation Patient not taking: Reported on 03/14/2018 02/10/17   Armandina Stammer I, NP  ursodiol (ACTIGALL) 300 MG capsule Take 1 capsule (300 mg total) by mouth 2 (two) times daily. For kidney stone Patient not taking: Reported on 03/14/2018 02/10/17   Armandina Stammer I, NP  lisinopril  (PRINIVIL,ZESTRIL) 40 MG tablet Take 1 tablet (40 mg total) by mouth 2 (two) times daily. For high blood pressure 09/05/15 10/01/15  Sanjuana Kava, NP    Family History Family History  Problem Relation Age of Onset  . CAD Mother        Living  . Diabetes Mellitus II Mother   . Stroke Mother   . Hypertension Mother   . Congestive Heart Failure Mother   . Kidney disease Mother   . Fibromyalgia Mother   . Thyroid disease Mother   . Hyperlipidemia Mother   . Liver disease Mother   . Alcoholism Father 28       Deceased  . Arthritis Maternal Grandmother   . Congestive Heart Failure Maternal Grandmother   . Hypertension Maternal Grandmother   . Lung cancer Maternal Grandfather   . Colon cancer Maternal Aunt   . Stomach cancer Maternal Aunt   . Heart disease Other        Paternal & Maternal  . Crohn's disease Other   . Hypertension Other        Paternal & Maternal  . Hypertension Brother        x3  . Hypertension Sister        #1  . Bipolar disorder Sister        #1  . ADD / ADHD Son        x3  . Bipolar disorder Son        x3  . Asperger's syndrome Son     Social History Social History   Tobacco Use  . Smoking status: Current Every Day Smoker    Packs/day: 0.50    Years: 23.00    Pack years: 11.50    Types: Cigarettes  . Smokeless tobacco: Never Used  Substance Use Topics  . Alcohol use: No    Alcohol/week: 0.0 standard drinks  . Drug use: Not Currently    Types: Benzodiazepines, Heroin     Allergies   Ace inhibitors; Atenolol; Lisinopril; Prednisone; Ibuprofen; Rexulti [brexpiprazole]; and Tylenol [acetaminophen]   Review of Systems Review of Systems Constitutional: Negative for chills and fever.  HENT: Negative for congestion, rhinorrhea and sore throat.   Respiratory: Positive for cough and shortness of breath. Negative for chest tightness and wheezing.   Cardiovascular: Negative for chest pain, palpitations and leg swelling.  Gastrointestinal: Positive  for nausea and vomiting. Negative for abdominal pain, blood in stool, constipation and diarrhea.  Genitourinary: Negative for dysuria and frequency.  Musculoskeletal: Positive for arthralgias  and joint swelling. Negative for myalgias.  Skin: Positive for color change. Negative for rash and wound.  Neurological: Negative for dizziness, syncope, weakness, light-headedness and numbness.   Physical Exam Updated Vital Signs BP 134/81   Pulse 89   Temp 98.1 F (36.7 C) (Oral)   Resp 16   Ht 6\' 2"  (1.88 m)   Wt (!) 175 kg   SpO2 92%   BMI 49.53 kg/m   Physical Exam Physical Exam Vitals signs and nursing note reviewed.  Constitutional:      General: He is not in acute distress.    Appearance: He is well-developed. He is obese. He is not ill-appearing, toxic-appearing or diaphoretic.     Comments: Morbidly obese patient who is chronically ill-appearing but nontoxic and in no acute distress on arrival  HENT:     Head: Normocephalic and atraumatic.     Mouth/Throat:     Mouth: Mucous membranes are moist.     Pharynx: Oropharynx is clear.  Eyes:     General:        Right eye: No discharge.        Left eye: No discharge.     Pupils: Pupils are equal, round, and reactive to light.  Neck:     Trachea: No tracheal deviation.  Cardiovascular:     Rate and Rhythm: Normal rate and regular rhythm.     Pulses: Normal pulses.     Heart sounds: Normal heart sounds. No murmur. No friction rub. No gallop.   Pulmonary:     Effort: Pulmonary effort is normal. No respiratory distress.     Breath sounds: Normal breath sounds. No wheezing or rales.     Comments: Normal respiratory effort and patient able to speak in full sentences, no acute respiratory distress.  On exam limited by body habitus but lungs clear throughout with some increased expiratory phase on exam.  No focal wheezing or rhonchi Chest:     Chest wall: No tenderness.  Abdominal:     General: Bowel sounds are normal. There is no  distension.     Palpations: Abdomen is soft. There is no mass.     Tenderness: There is no abdominal tenderness. There is no guarding.     Comments: Abdomen soft, nondistended, nontender to palpation in all quadrants without guarding or peritoneal signs  Musculoskeletal:        General: No deformity.     Right lower leg: He exhibits no tenderness. No edema.     Left lower leg: He exhibits no tenderness. No edema.     Comments: Right hand is edematous with erythema over the dorsum of the hand that does not extend over the fingers.  There is no clear area of fluctuance and no drainage from the area 2+ radial pulse and good cap refill, normal sensation and 5/5 grip strength, cardinal hand movements intact. Left hand mildly edematous but without signs of infection.  Skin:    General: Skin is warm and dry.     Capillary Refill: Capillary refill takes less than 2 seconds.  Neurological:     General: No focal deficit present.     Mental Status: He is alert and oriented to person, place, and time.     Coordination: Coordination normal.     Comments: Speech is clear, able to follow commands CN III-XII intact Normal strength in upper and lower extremities bilaterally including dorsiflexion and plantar flexion, strong and equal grip strength Sensation normal to light and sharp  touch Moves extremities without ataxia, coordination intact  Psychiatric:        Mood and Affect: Mood normal.        Behavior: Behavior normal.    ED Treatments / Results  Labs (all labs ordered are listed, but only abnormal results are displayed) Labs Reviewed  COMPREHENSIVE METABOLIC PANEL - Abnormal; Notable for the following components:      Result Value   Glucose, Bld 116 (*)    Calcium 8.4 (*)    Albumin 3.4 (*)    AST 53 (*)    ALT 52 (*)    All other components within normal limits  URINALYSIS, ROUTINE W REFLEX MICROSCOPIC - Abnormal; Notable for the following components:   Color, Urine AMBER (*)    All  other components within normal limits  CBC WITH DIFFERENTIAL/PLATELET  BRAIN NATRIURETIC PEPTIDE  I-STAT CG4 LACTIC ACID, ED  I-STAT TROPONIN, ED    EKG None  Radiology Dg Chest 2 View  Result Date: 03/14/2018 CLINICAL DATA:  Shortness of breath. EXAM: CHEST - 2 VIEW COMPARISON:  Radiographs of May 25, 2017. FINDINGS: The heart size and mediastinal contours are within normal limits. Both lungs are clear. No pneumothorax or pleural effusion is noted. The visualized skeletal structures are unremarkable. IMPRESSION: No active cardiopulmonary disease. Electronically Signed   By: Lupita RaiderJames  Green Jr, M.D.   On: 03/14/2018 12:17   Dg Hand Complete Right  Result Date: 03/14/2018 CLINICAL DATA:  Right hand infection. EXAM: RIGHT HAND - COMPLETE 3+ VIEW COMPARISON:  Radiographs of June 27, 2017. FINDINGS: There is no evidence of fracture or dislocation. There is no evidence of arthropathy or other focal bone abnormality. Stable linear metallic foreign body seen in the soft tissues on the ulnar side of the carpal bones. Severe dorsal soft tissue swelling is noted concerning for infection. IMPRESSION: Severe dorsal soft tissue swelling is noted concerning for infection. Stable appearance of linear metallic foreign body seen on the ulnar side of the carpal bones. No fracture or bony abnormality is noted. Electronically Signed   By: Lupita RaiderJames  Green Jr, M.D.   On: 03/14/2018 12:20    Procedures Procedures (including critical care time)  Medications Ordered in ED Medications  doxycycline (VIBRA-TABS) tablet 100 mg (100 mg Oral Given 03/14/18 1326)     Initial Impression / Assessment and Plan / ED Course  I have reviewed the triage vital signs and the nursing notes.  Pertinent labs & imaging results that were available during my care of the patient were reviewed by me and considered in my medical decision making (see chart for details).  Patient presents for evaluation of shortness of breath and right  hand infection.  Denies any focal injury or laceration to the hand, reports over the past 3 days it has become increasingly swollen and erythematous over the dorsum of the hand.  Also reports worsening shortness of breath over the last 3 days with some productive cough.  No associated chest pain.  On arrival patient afebrile, mildly hypertensive but vitals otherwise normal.  He is morbidly obese and appears chronically ill but in no acute distress.  Lungs clear with some prolonged expiratory phase.  No focal lung sounds.  Abdomen is benign.  The right hand has erythema and swelling over the dorsum with no obvious area of fluctuance or drainage to suggest drainable abscess.  Case discussed with Dr. Freida BusmanAllen who saw and evaluated patient as well and helped guide work-up and care.  Low suspicion for ACS,  but given patient's shortness of breath will check basic labs, urinalysis, chest x-ray, troponin and BNP.  Given infection of the hand will also get x-ray and check lactic acid.  Patient has not yet been on any antibiotics for this infection.  EKG with this rhythm with no acute ischemic changes.  Lactate not elevated.  Patient has no leukocytosis and stable hemoglobin, no acute electrolyte derangements requiring intervention, normal renal function, AST and ALT minimally elevated.  Chest x-ray with no active cardiopulmonary disease.  BNP is not elevated.  Work-up regarding shortness of breath is overall reassuring, suspect his body habitus is contributing somewhat but no acute finding that would require admission.  Patient is not hypoxic or tachypneic.  X-ray of the hand shows severe dorsal soft tissue swelling concerning for infection there is stable appearance of a linear metallic foreign body which was noted previously.  No gas to suggest necrotizing fasciitis.  Given that patient has not yet been on any antibiotics this he has not yet had outpatient therapy.  Will start patient on p.o. doxycycline, area of  cellulitis was demarcated and patient given strict return precautions to come back if the redness is not receding, or it exceeds the demarcated line, fevers or any worsening in other symptoms.  Patient expresses understanding.  He reports that in the past he has been given p.o. antibiotics and had to return for IV antibiotics but discussed with patient that it is prudent to first attempt outpatient antibiotics prior to admission.  He expresses agreement.  Discharged in good condition.  Patient discussed with Dr. Freida Busman, who saw patient as well and agrees with plan.  Final Clinical Impressions(s) / ED Diagnoses   Final diagnoses:  Cellulitis of right hand  SOB (shortness of breath)    ED Discharge Orders         Ordered    doxycycline (VIBRAMYCIN) 100 MG capsule  2 times daily     03/14/18 1331    ondansetron (ZOFRAN ODT) 4 MG disintegrating tablet     03/14/18 1331           Jodi Geralds [place], [place] 03/14/18 1726    Lorre Nick, MD 03/16/18 1616

## 2018-03-14 NOTE — ED Triage Notes (Signed)
Patient presented to ed with c/o right possible infection. Patient c/o 7/10 pain in both hand. Hand are swollen and warm to touch.

## 2018-03-14 NOTE — Progress Notes (Signed)
Pharmacy Antibiotic Note  Bryan Gallegos is a 43 y.o. male admitted on 03/14/2018 with cellulitis.  Pharmacy has been consulted for vancomycin dosing.  Plan: Vancomycin 2500 mg IV x1, then 1250 q8h   Monitor clinical course, renal function, cultures as available   Height: 6\' 2"  (188 cm) Weight: (!) 365 lb (165.6 kg) IBW/kg (Calculated) : 82.2  Temp (24hrs), Avg:98.1 F (36.7 C), Min:98 F (36.7 C), Max:98.1 F (36.7 C)  Recent Labs  Lab 03/14/18 1152 03/14/18 1201  WBC 6.3  --   CREATININE 0.89  --   LATICACIDVEN  --  0.89    Estimated Creatinine Clearance: 173.2 mL/min (by C-G formula based on SCr of 0.89 mg/dL).    Allergies  Allergen Reactions  . Ace Inhibitors Anaphylaxis  . Atenolol Anaphylaxis  . Lisinopril Anaphylaxis  . Prednisone Hives  . Ibuprofen Itching, Swelling and Other (See Comments)    Hand/feet swelling   . Rexulti [Brexpiprazole] Swelling  . Tylenol [Acetaminophen] Other (See Comments)    Reaction:  GI bleeding     Antimicrobials this admission: Doxycycline x1 in ED 1/14 Vancomycin 1/14 >>   Dose adjustments this admission:   Microbiology results:   Thank you for allowing pharmacy to be a part of this patient's care.   Adalberto Cole, PharmD, BCPS Pager [phone] 03/14/2018 6:47 PM

## 2018-03-14 NOTE — ED Notes (Signed)
ED TO INPATIENT HANDOFF REPORT  Name/Age/Gender Bryan Gallegos 43 y.o. male  Code Status Code Status History    Date Active Date Inactive Code Status Order ID Comments User Context   12/15/2017 2237 12/17/2017 1601 Full Code [id number]  Pearson GrippeKim, James, MD ED   05/01/2017 0918 05/04/2017 2146 Full Code [id number]  Molt, Bethany, DO ED   02/09/2017 1132 02/10/2017 2010 Full Code [id number]  Laveda AbbeParks, Laurie Britton, NP Inpatient   02/08/2017 2114 02/09/2017 1131 Full Code [id number]  Georgiana ShoreMitchell, Jessica B, PA-C ED   01/10/2017 2314 01/14/2017 2244 Full Code [id number]  Therisa Doyneoutova, Anastassia, MD Inpatient   11/11/2016 1456 11/11/2016 2008 Full Code [id number]  Simonne MartinetBabcock, Peter E, NP ED   10/17/2016 0422 10/18/2016 1423 Full Code [id number]  Antony MaduraHumes, Kelly, PA-C ED   08/10/2016 1805 08/12/2016 1329 Full Code [id number]  Raeford RazorKohut, Stephen, MD ED   08/10/2016 0507 08/10/2016 1337 Full Code [id number]  Molpus, John, MD ED   01/04/2016 0238 01/06/2016 1611 Full Code [id number]  Cornell BarmanKannappan, Arun, MD ED   01/01/2016 2357 01/02/2016 2332 Full Code [id number]  Briscoe Deutscherpyd, Timothy S, MD ED   10/25/2015 1553 10/26/2015 1323 Full Code [id number]  Benjiman CorePickering, Nathan, MD ED   10/08/2015 2105 10/13/2015 2054 Full Code [id number]  Lorretta HarpNiu, Xilin, MD ED   09/28/2015 0959 10/01/2015 1833 Full Code [id number]  Beaulah DinningGambino, Christina M, MD ED   09/14/2015 0244 09/17/2015 0627 Full Code [id number]  Hillary BowGardner, Jared M, DO ED   09/04/2015 1430 09/05/2015 1556 Full Code [id number]  Charm RingsLord, Jamison Y, NP Inpatient   09/04/2015 0006 09/04/2015 1430 Full Code [id number]  Dione BoozeGlick, David, MD ED   05/17/2015 0303 05/22/2015 2101 Full Code [id number]  Alberteen Samanford, Christopher P, MD Inpatient   04/11/2015 2149 04/13/2015 1503 Full Code [id number]  Ron ParkerJenkins, Harvette C, MD ED   04/10/2015 1654 04/11/2015 1621 Full Code [id number]  Shari ProwsPucilowska, Jolanta B, MD Inpatient   04/09/2015 1538 04/10/2015 1654 Full Code [id number]  Earney Navynuoha, Josephine C, NP Inpatient   04/09/2015 0657 04/09/2015 1538 Full Code [id number]  Shon BatonHorton, Courtney  F, MD ED   09/02/2013 1354 09/04/2013 1432 Full Code [id number]  Vassie LollMadera, Carlos, MD Inpatient   08/06/2013 2227 08/07/2013 2030 Full Code [id number]  Eduard ClosKakrakandy, Arshad N, MD Inpatient   06/27/2011 2239 06/28/2011 0728 Full Code [id number]  Ward GivensKnapp, Iva L, MD ED      Home/SNF/Other jail  Chief Complaint Rash on hand  Level of Care/Admitting Diagnosis ED Disposition    ED Disposition Condition Comment   Admit  [facility] [[id number][facility]  Level of Care: Med-Surg [16]  Diagnosis: Cellulitis of hand, right [[id number]][[id number]]  Admitting Physician: Rometta EmeryGARBA, MOHAMMAD L [[id number]]  Attending Physician: Rometta EmeryGARBA, MOHAMMAD L [[id number]]  Estimated length of stay: past midnight tomorrow  Certification:: I certify this patient will need inpatient services for at least 2 midnights  PT Class (Do Not Modify): Inpatient [101]  PT Acc Code (Do Not Modify): Private [1]       Medical History Past Medical History:  Diagnosis Date  . Adult ADHD   . Anaphylactic reaction   . Anxiety   . Asthma   . Bipolar disorder (HCC)   . Complication of anesthesia    Per patient difficult intubation;  . Diabetes mellitus   . Difficult intubation    Per patient  . Drug overdose   . Heart valve disorder    s/p echocardiogram  . Hyperlipidemia   . Hypertension   . Morbidly obese (HCC)   . OSA (obstructive  sleep apnea)   . Polysubstance abuse (HCC)   . Transient cerebral ischemia    Unknown    Allergies Allergies  Allergen Reactions  . Ace Inhibitors Anaphylaxis  . Atenolol Anaphylaxis  . Lisinopril Anaphylaxis  . Prednisone Hives  . Ibuprofen Itching, Swelling and Other (See Comments)    Hand/feet swelling   . Rexulti [Brexpiprazole] Swelling  . Tylenol [Acetaminophen] Other (See Comments)    Reaction:  GI bleeding     IV Location/Drains/Wounds Patient Lines/Drains/Airways Status   Active Line/Drains/Airways    Name:   Placement date:   Placement time:   Site:   Days:   Incision (Closed)  05/01/17 Lumbar Posterior;Mid   05/01/17    1214     317          Labs/Imaging Results for orders placed or performed during the hospital encounter of 03/14/18 (from the past 48 hour(s))  Brain natriuretic peptide     Status: None   Collection Time: 03/14/18 11:07 AM  Result Value Ref Range   B Natriuretic Peptide 18.6 0.0 - 100.0 pg/mL    Comment: Performed at [facility], [address], [place], [place] [address]  Urinalysis, Routine w reflex microscopic     Status: Abnormal   Collection Time: 03/14/18 11:36 AM  Result Value Ref Range   Color, Urine AMBER (A) YELLOW    Comment: BIOCHEMICALS MAY BE AFFECTED BY COLOR   APPearance CLEAR CLEAR   Specific Gravity, Urine 1.025 1.005 - 1.030   pH 5.0 5.0 - 8.0   Glucose, UA NEGATIVE NEGATIVE mg/dL   Hgb urine dipstick NEGATIVE NEGATIVE   Bilirubin Urine NEGATIVE NEGATIVE   Ketones, ur NEGATIVE NEGATIVE mg/dL   Protein, ur NEGATIVE NEGATIVE mg/dL   Nitrite NEGATIVE NEGATIVE   Leukocytes, UA NEGATIVE NEGATIVE    Comment: Performed at [facility], [address], [place], [place] [address]  Comprehensive metabolic panel     Status: Abnormal   Collection Time: 03/14/18 11:52 AM  Result Value Ref Range   Sodium 135 135 - 145 mmol/L   Potassium 4.1 3.5 - 5.1 mmol/L   Chloride 100 98 - 111 mmol/L   CO2 27 22 - 32 mmol/L   Glucose, Bld 116 (H) 70 - 99 mg/dL   BUN 10 6 - 20 mg/dL   Creatinine, Ser 0.98 0.61 - 1.24 mg/dL   Calcium 8.4 (L) 8.9 - 10.3 mg/dL   Total Protein 7.3 6.5 - 8.1 g/dL   Albumin 3.4 (L) 3.5 - 5.0 g/dL   AST 53 (H) 15 - 41 U/L   ALT 52 (H) 0 - 44 U/L   Alkaline Phosphatase 68 38 - 126 U/L   Total Bilirubin 1.0 0.3 - 1.2 mg/dL   GFR calc non Af Amer >60 >60 mL/min   GFR calc Af Amer >60 >60 mL/min   Anion gap 8 5 - 15    Comment: Performed at [facility], [address] [place], [place] [address]  CBC with Differential     Status: None   Collection  Time: 03/14/18 11:52 AM  Result Value Ref Range   WBC 6.3 4.0 - 10.5 K/uL   RBC 4.61 4.22 - 5.81 MIL/uL   Hemoglobin 13.8 13.0 - 17.0 g/dL   HCT 78.2 95.6 - 21.3 %   MCV 93.7 80.0 - 100.0 fL   MCH 29.9 26.0 - 34.0 pg   MCHC 31.9 30.0 - 36.0 g/dL   RDW 08.6 57.8 -  15.5 %   Platelets 161 150 - 400 K/uL   nRBC 0.0 0.0 - 0.2 %   Neutrophils Relative % 38 %   Neutro Abs 2.4 1.7 - 7.7 K/uL   Lymphocytes Relative 50 %   Lymphs Abs 3.2 0.7 - 4.0 K/uL   Monocytes Relative 8 %   Monocytes Absolute 0.5 0.1 - 1.0 K/uL   Eosinophils Relative 2 %   Eosinophils Absolute 0.2 0.0 - 0.5 K/uL   Basophils Relative 1 %   Basophils Absolute 0.0 0.0 - 0.1 K/uL   WBC Morphology VACUOLATED NEUTROPHILS    Immature Granulocytes 1 %   Abs Immature Granulocytes 0.03 0.00 - 0.07 K/uL    Comment: Performed at [facility], [address] [place], [place] [address]  I-stat troponin, ED     Status: None   Collection Time: 03/14/18 11:59 AM  Result Value Ref Range   Troponin i, poc 0.00 0.00 - 0.08 ng/mL   Comment 3            Comment: Due to the release kinetics of cTnI, a negative result within the first hours of the onset of symptoms does not rule out myocardial infarction with certainty. If myocardial infarction is still suspected, repeat the test at appropriate intervals.   I-Stat CG4 Lactic Acid, ED     Status: None   Collection Time: 03/14/18 12:01 PM  Result Value Ref Range   Lactic Acid, Venous 0.89 0.5 - 1.9 mmol/L   Dg Chest 2 View  Result Date: 03/14/2018 CLINICAL DATA:  Shortness of breath. EXAM: CHEST - 2 VIEW COMPARISON:  Radiographs of May 25, 2017. FINDINGS: The heart size and mediastinal contours are within normal limits. Both lungs are clear. No pneumothorax or pleural effusion is noted. The visualized skeletal structures are unremarkable. IMPRESSION: No active cardiopulmonary disease. Electronically Signed   By: Lupita Raider, M.D.   On: 03/14/2018 12:17   Dg  Hand Complete Right  Result Date: 03/14/2018 CLINICAL DATA:  Right hand infection. EXAM: RIGHT HAND - COMPLETE 3+ VIEW COMPARISON:  Radiographs of June 27, 2017. FINDINGS: There is no evidence of fracture or dislocation. There is no evidence of arthropathy or other focal bone abnormality. Stable linear metallic foreign body seen in the soft tissues on the ulnar side of the carpal bones. Severe dorsal soft tissue swelling is noted concerning for infection. IMPRESSION: Severe dorsal soft tissue swelling is noted concerning for infection. Stable appearance of linear metallic foreign body seen on the ulnar side of the carpal bones. No fracture or bony abnormality is noted. Electronically Signed   By: Lupita Raider, M.D.   On: 03/14/2018 12:20   None  Pending Labs Unresulted Labs (From admission, onward)    Start     Ordered   Signed and Held  CBC  (enoxaparin (LOVENOX)    CrCl >/= 30 ml/min)  Once,   R    Comments:  Baseline for enoxaparin therapy IF NOT ALREADY DRAWN.  Notify MD if PLT < 100 K.    Signed and Held   Signed and Held  Creatinine, serum  (enoxaparin (LOVENOX)    CrCl >/= 30 ml/min)  Once,   R    Comments:  Baseline for enoxaparin therapy IF NOT ALREADY DRAWN.    Signed and Held   Signed and Held  Creatinine, serum  (enoxaparin (LOVENOX)    CrCl >/= 30 ml/min)  Weekly,   R    Comments:  while  on enoxaparin therapy    Signed and Held   Signed and Held  Comprehensive metabolic panel  Tomorrow morning,   R     Signed and Held   Signed and Held  CBC  Tomorrow morning,   R     Signed and Held          Vitals/Pain Today's Vitals   03/14/18 1654 03/14/18 1700  BP: (!) 161/81   Pulse: 94   Resp: 16   Temp: 98 F (36.7 C)   TempSrc: Oral   SpO2: 99%   Weight: (!) 165.6 kg   Height: 6\' 2"  (1.88 m)   PainSc:  10-Worst pain ever    Isolation Precautions No active isolations  Medications Medications  vancomycin (VANCOCIN) 2,500 mg in sodium chloride 0.9 % 500 mL IVPB  (has no administration in time range)  vancomycin (VANCOCIN) 1,250 mg in sodium chloride 0.9 % 250 mL IVPB (has no administration in time range)    Mobility walks

## 2018-03-14 NOTE — ED Notes (Signed)
Report given to Valerie RN

## 2018-03-14 NOTE — ED Triage Notes (Signed)
Pt brought in from [facility] in [place].

## 2018-03-14 NOTE — Progress Notes (Signed)
Pharmacy Note   A consult was received from an ED physician for vancomycin per pharmacy dosing.    The patient's profile has been reviewed for ht/wt/allergies/indication/available labs.    A one time order has been placed for vancomycin 2500 mg IV x1 .  Further antibiotics/pharmacy consults should be ordered by admitting physician if indicated.                       Thank you,  Adalberto Cole, PharmD, BCPS Pager [phone] 03/14/2018 6:28 PM

## 2018-03-15 DIAGNOSIS — G4733 Obstructive sleep apnea (adult) (pediatric): Secondary | ICD-10-CM | POA: Diagnosis not present

## 2018-03-15 DIAGNOSIS — L02519 Cutaneous abscess of unspecified hand: Secondary | ICD-10-CM

## 2018-03-15 DIAGNOSIS — F1120 Opioid dependence, uncomplicated: Secondary | ICD-10-CM

## 2018-03-15 DIAGNOSIS — F17200 Nicotine dependence, unspecified, uncomplicated: Secondary | ICD-10-CM

## 2018-03-15 DIAGNOSIS — L03119 Cellulitis of unspecified part of limb: Secondary | ICD-10-CM | POA: Diagnosis not present

## 2018-03-15 DIAGNOSIS — Z9989 Dependence on other enabling machines and devices: Secondary | ICD-10-CM

## 2018-03-15 DIAGNOSIS — F1920 Other psychoactive substance dependence, uncomplicated: Secondary | ICD-10-CM

## 2018-03-15 LAB — CBC
HCT: 42.9 % (ref 39.0–52.0)
Hemoglobin: 13.4 g/dL (ref 13.0–17.0)
MCH: 29.8 pg (ref 26.0–34.0)
MCHC: 31.2 g/dL (ref 30.0–36.0)
MCV: 95.5 fL (ref 80.0–100.0)
NRBC: 0 % (ref 0.0–0.2)
Platelets: 147 10*3/uL — ABNORMAL LOW (ref 150–400)
RBC: 4.49 MIL/uL (ref 4.22–5.81)
RDW: 13.4 % (ref 11.5–15.5)
WBC: 6.1 10*3/uL (ref 4.0–10.5)

## 2018-03-15 LAB — GLUCOSE, CAPILLARY
GLUCOSE-CAPILLARY: 131 mg/dL — AB (ref 70–99)
Glucose-Capillary: 139 mg/dL — ABNORMAL HIGH (ref 70–99)
Glucose-Capillary: 125 mg/dL — ABNORMAL HIGH (ref 70–99)
Glucose-Capillary: 130 mg/dL — ABNORMAL HIGH (ref 70–99)

## 2018-03-15 LAB — COMPREHENSIVE METABOLIC PANEL
ALT: 49 U/L — ABNORMAL HIGH (ref 0–44)
ANION GAP: 10 (ref 5–15)
AST: 55 U/L — ABNORMAL HIGH (ref 15–41)
Albumin: 3.1 g/dL — ABNORMAL LOW (ref 3.5–5.0)
Alkaline Phosphatase: 62 U/L (ref 38–126)
BUN: 9 mg/dL (ref 6–20)
CO2: 24 mmol/L (ref 22–32)
Calcium: 8.4 mg/dL — ABNORMAL LOW (ref 8.9–10.3)
Chloride: 104 mmol/L (ref 98–111)
Creatinine, Ser: 0.83 mg/dL (ref 0.61–1.24)
GFR calc Af Amer: >60 mL/min (ref >60)
GFR calc non Af Amer: >60 mL/min (ref >60)
Glucose, Bld: 123 mg/dL — ABNORMAL HIGH (ref 70–99)
Potassium: 4.4 mmol/L (ref 3.5–5.1)
Sodium: 138 mmol/L (ref 135–145)
Total Bilirubin: 1 mg/dL (ref 0.3–1.2)
Total Protein: 6.9 g/dL (ref 6.5–8.1)

## 2018-03-15 LAB — HEMOGLOBIN A1C
Hgb A1c MFr Bld: 6.3 % — ABNORMAL HIGH (ref 4.8–5.6)
Mean Plasma Glucose: 134.11 mg/dL

## 2018-03-15 LAB — MRSA PCR SCREENING: MRSA by PCR: NEGATIVE

## 2018-03-15 MED ORDER — CLONIDINE HCL 0.1 MG PO TABS
0.2000 mg | ORAL_TABLET | Freq: Two times a day (BID) | ORAL | Status: DC
  Administered 2018-03-15 – 2018-03-18 (×6): 0.2 mg via ORAL
  Filled 2018-03-15 (×8): qty 2

## 2018-03-15 MED ORDER — HYDROXYZINE HCL 25 MG PO TABS
25.0000 mg | ORAL_TABLET | Freq: Three times a day (TID) | ORAL | Status: DC | PRN
  Administered 2018-03-17 – 2018-03-18 (×2): 25 mg via ORAL
  Filled 2018-03-15 (×2): qty 1

## 2018-03-15 MED ORDER — OXYCODONE HCL 5 MG PO TABS
15.0000 mg | ORAL_TABLET | Freq: Four times a day (QID) | ORAL | Status: DC | PRN
  Administered 2018-03-16 – 2018-03-18 (×6): 15 mg via ORAL
  Filled 2018-03-15 (×6): qty 3

## 2018-03-15 MED ORDER — CLONAZEPAM 1 MG PO TABS: 1.0000 mg | ORAL_TABLET | Freq: Three times a day (TID) | ORAL | Status: DC | PRN

## 2018-03-15 MED ORDER — OXCARBAZEPINE 300 MG PO TABS
300.0000 mg | ORAL_TABLET | Freq: Every day | ORAL | Status: DC
  Administered 2018-03-15 – 2018-03-18 (×4): 300 mg via ORAL
  Filled 2018-03-15 (×4): qty 1

## 2018-03-15 MED ORDER — SITAGLIPTIN PHOSPHATE 25 MG PO TABS
50.0000 mg | ORAL_TABLET | Freq: Every day | ORAL | Status: DC
  Administered 2018-03-16 – 2018-03-18 (×3): 50 mg via ORAL
  Filled 2018-03-15 (×3): qty 2

## 2018-03-15 MED ORDER — ZIPRASIDONE HCL 80 MG PO CAPS
80.0000 mg | ORAL_CAPSULE | Freq: Two times a day (BID) | ORAL | Status: DC
  Administered 2018-03-15 – 2018-03-18 (×5): 80 mg via ORAL
  Filled 2018-03-15 (×6): qty 1

## 2018-03-15 MED ORDER — ALBUTEROL SULFATE (2.5 MG/3ML) 0.083% IN NEBU: 3.0000 mL | INHALATION_SOLUTION | Freq: Four times a day (QID) | RESPIRATORY_TRACT | Status: DC | PRN

## 2018-03-15 MED ORDER — METFORMIN HCL 500 MG PO TABS
500.0000 mg | ORAL_TABLET | Freq: Every day | ORAL | Status: DC
  Administered 2018-03-16 – 2018-03-18 (×3): 500 mg via ORAL
  Filled 2018-03-15 (×3): qty 1

## 2018-03-15 MED ORDER — TIZANIDINE HCL 4 MG PO TABS
4.0000 mg | ORAL_TABLET | Freq: Three times a day (TID) | ORAL | Status: DC
  Administered 2018-03-15 – 2018-03-18 (×8): 4 mg via ORAL
  Filled 2018-03-15 (×8): qty 1

## 2018-03-15 MED ORDER — HYDROCHLOROTHIAZIDE 25 MG PO TABS
25.0000 mg | ORAL_TABLET | Freq: Every day | ORAL | Status: DC
  Administered 2018-03-15 – 2018-03-18 (×4): 25 mg via ORAL
  Filled 2018-03-15 (×4): qty 1

## 2018-03-15 MED ORDER — TRAZODONE HCL 100 MG PO TABS
200.0000 mg | ORAL_TABLET | Freq: Every day | ORAL | Status: DC
  Administered 2018-03-15 – 2018-03-17 (×3): 200 mg via ORAL
  Filled 2018-03-15 (×3): qty 2

## 2018-03-15 MED ORDER — ATORVASTATIN CALCIUM 10 MG PO TABS
20.0000 mg | ORAL_TABLET | Freq: Every day | ORAL | Status: DC
  Administered 2018-03-15 – 2018-03-17 (×3): 20 mg via ORAL
  Filled 2018-03-15 (×3): qty 2

## 2018-03-15 MED ORDER — GABAPENTIN 400 MG PO CAPS
800.0000 mg | ORAL_CAPSULE | Freq: Three times a day (TID) | ORAL | Status: DC
  Administered 2018-03-15 – 2018-03-18 (×8): 800 mg via ORAL
  Filled 2018-03-15 (×8): qty 2

## 2018-03-15 MED ORDER — METFORMIN HCL 500 MG PO TABS
1000.0000 mg | ORAL_TABLET | Freq: Every day | ORAL | Status: DC
  Administered 2018-03-15 – 2018-03-17 (×2): 1000 mg via ORAL
  Filled 2018-03-15 (×2): qty 2

## 2018-03-15 MED ORDER — MOMETASONE FURO-FORMOTEROL FUM 100-5 MCG/ACT IN AERO
2.0000 | INHALATION_SPRAY | Freq: Two times a day (BID) | RESPIRATORY_TRACT | Status: DC
  Administered 2018-03-15 – 2018-03-18 (×6): 2 via RESPIRATORY_TRACT
  Filled 2018-03-15: qty 8.8

## 2018-03-15 MED ORDER — PANTOPRAZOLE SODIUM 40 MG PO TBEC
40.0000 mg | DELAYED_RELEASE_TABLET | Freq: Every day | ORAL | Status: DC
  Administered 2018-03-15 – 2018-03-18 (×4): 40 mg via ORAL
  Filled 2018-03-15 (×4): qty 1

## 2018-03-15 MED ORDER — SITAGLIPTIN PHOS-METFORMIN HCL 50-500 MG PO TABS: 1.0000 | ORAL_TABLET | Freq: Every day | ORAL | Status: DC

## 2018-03-15 NOTE — Progress Notes (Addendum)
[facility] Progress Note  Subjective: no c/o this am, no n/v/d, no chills or fevers, pain in R hand is improved a little bit  Vitals:   03/14/18 1654 03/14/18 2015 03/15/18 0630  BP: (!) 161/81 (!) 141/76 (!) 121/59  Pulse: 94 83 79  Resp: 16 16 20   Temp: 98 F (36.7 C) 97.9 F (36.6 C) 98 F (36.7 C)  TempSrc: Oral    SpO2: 99% 99% 94%  Weight: (!) 165.6 kg    Height: 6\' 2"  (1.88 m)      Inpatient medications: . enoxaparin (LOVENOX) injection  40 mg Subcutaneous Q24H  . feeding supplement (ENSURE ENLIVE)  237 mL Oral BID BM  . insulin aspart  0-5 Units Subcutaneous QHS  . insulin aspart  0-9 Units Subcutaneous TID WC  . nicotine  21 mg Transdermal Daily   . sodium chloride 75 mL/hr at 03/15/18 1240  . vancomycin 1,250 mg (03/15/18 0620)   HYDROcodone-acetaminophen, morphine injection, ondansetron **OR** ondansetron (ZOFRAN) IV  Exam: Gen: alert, in bed not in distress Eyes: PERRL, lids and conjunctivae normal ENMT: Mucous membranes are moist. Posterior pharynx clear of any exudate or lesions.Normal dentition.  Neck: normal, supple, no masses, no thyromegaly Respiratory: clear to auscultation bilaterally, no wheezing, no crackles. Normal respiratory effort. No accessory muscle use.  Cardiovascular: Regular rate and rhythm, no murmurs / rubs / gallops. No extremity edema. 2+ pedal pulses. No carotid bruits.  Abdomen: no tenderness, no masses palpated. No hepatosplenomegaly. Bowel sounds positive.  Musculoskeletal: Right back of the hand is swollen, tender, red, multiple areas of pustules are disappearing, no frank pus or fluctuance today, normal muscle tone.  Skin: no rashes, lesions, ulcers. No induration Neurologic: CN 2-12 grossly intact. Sensation intact, DTR normal. Strength 5/5 in all 4.  Psychiatric: Normal judgment and insight. Alert and oriented x 3. Normal mood.      Presentation Summary: Bryan Gallegos is a 43 y.o. male with medical history  significant of bipolar disorder, asthma, hyperlipidemia, morbid obesity, obstructive sleep apnea, polysubstance abuse, previous skin infection and ADHD who was brought in from jail where he went today with right hand swelling tenderness and pain.  Patient is symptoms started about a week ago with a small area at the back of his hand which looked like a boil.  It has since progressed to involve the whole back of his hand.  Swollen tender and painful.  He had low-grade temperature.  Patient also has pain rated as 10 out of 10.  No discharge from the hand.  Denied any injury or animal bite. He was arrested today and taken to jail something having to do with his "bond running out". But then he was brought back to the ER due to the current symptoms  ED Course: Temperature is 98.1 blood pressure one 116/81 pulse 94 respiratory of 16 his sats 91% room air.  CBC and chemistry all appear to be within normal.  Chest x-ray showed no active findings.  X-ray of the right hand shows severe dorsal soft tissue swelling.  Patient has a linear metallic foreign body seen on the ulnar side of the carpal bones but no fractures.       Hospital Problems/ Course: Principal Problem:   Cellulitis of hand, right Active Problems:   HLD (hyperlipidemia)   OSA on CPAP   Gastroesophageal reflux disease with esophagitis   Diabetes mellitus type II, controlled (HCC)   Morbid obesity (HCC)   Tobacco use disorder   Polysubstance  dependence including opioid type drug, episodic abuse (HCC)   Hepatitis C infection   MDD (major depressive disorder), recurrent severe, without psychosis (HCC)    Hosp Course:  #1 Cellulitis and abscess of the right hand: Patient has significant cellulitis of the back of the R hand and what sound like had some small pustules which have drained overnight or prior to admission. He is a diabetic. - no gross abscess today, cellulitis is improving slightly from overnight - good ROM at the wrist and  tenderness improved - will cont IV vancomycin - cont to monitor, if abscess develops get surgical consultation.  #2 obstructive sleep apnea: Patient uses CPAP at home and will provide.  #3 diabetes: Resume home metformin and Janumet.  Initiate sliding scale insulin. Check Hb A1C.  Consult Diabetes team.   #4 morbid obesity: Dietary counseling.  #5 hyperlipidemia: Continue with home regimen.  #6 polysubstance abuse: Including tobacco.  Nicotine patch will be added.  #7 depression with anxiety: resuming multiple depression/ anxiety meds         Vinson Moselle MD [facility] pgr [phone] 03/15/2018, 1:02 PM   DVT prophylaxis: Heparin Code Status: Full code Family Communication: No family available discussed care with patient Disposition Plan: Back to jail Consults called: None Admission status: Inpatient     Vinson Moselle MD [facility] pgr [phone] 03/15/2018, 12:50 PM   Recent Labs  Lab 03/14/18 1152 03/14/18 2205 03/15/18 0557  NA 135  --  138  K 4.1  --  4.4  CL 100  --  104  CO2 27  --  24  GLUCOSE 116*  --  123*  BUN 10  --  9  CREATININE 0.89 0.86 0.83  CALCIUM 8.4*  --  8.4*   Recent Labs  Lab 03/14/18 1152 03/15/18 0557  AST 53* 55*  ALT 52* 49*  ALKPHOS 68 62  BILITOT 1.0 1.0  PROT 7.3 6.9  ALBUMIN 3.4* 3.1*   Recent Labs  Lab 03/14/18 1152 03/14/18 2205 03/15/18 0557  WBC 6.3 6.6 6.1  NEUTROABS 2.4  --   --   HGB 13.8 13.3 13.4  HCT 43.2 41.9 42.9  MCV 93.7 92.9 95.5  PLT 161 185 147*   Iron/TIBC/Ferritin/ %Sat No results found for: IRON, TIBC, FERRITIN, IRONPCTSAT

## 2018-03-15 NOTE — Progress Notes (Signed)
Nutrition Brief Note  Patient identified on the Malnutrition Screening Tool (MST) Report  Patient's weights have remained stable since 2018.  Wt Readings from Last 15 Encounters:  03/14/18 (!) 165.6 kg  03/14/18 (!) 175 kg  02/24/18 (!) 174.6 kg  12/17/17 (!) 176.5 kg  07/14/17 (!) 161 kg  06/27/17 (!) 160.6 kg  06/01/17 (!) 166 kg  05/25/17 (!) 163.3 kg  05/04/17 (!) 163.4 kg  04/13/17 (!) 154.7 kg  01/10/17 (!) 158 kg  01/07/17 (!) 158.8 kg  09/11/16 (!) 171 kg  09/07/16 (!) 161 kg  08/09/16 (!) 161 kg    Body mass index is 46.86 kg/m. Patient meets criteria for morbid obesity based on current BMI.   Current diet order is regular, patient is consuming approximately 50% of meals at this time. Labs and medications reviewed.   No nutrition interventions warranted at this time. If nutrition issues arise, please consult RD.   [name], MS, RD, LDN [facility] Inpatient Clinical Dietitian Pager: [phone] After Hours Pager: [phone]

## 2018-03-15 NOTE — Progress Notes (Signed)
Inpatient Diabetes Program Recommendations  AACE/ADA: New Consensus Statement on Inpatient Glycemic Control (2015)  Target Ranges:  Prepandial:   less than 140 mg/dL      Peak postprandial:   less than 180 mg/dL (1-2 hours)      Critically ill patients:  140 - 180 mg/dL   Results for JOS, FLEWELLING (MRN [id number]) as of 03/15/2018 14:07  Ref. Range 03/14/2018 23:49 03/15/2018 07:38 03/15/2018 11:23  Glucose-Capillary Latest Ref Range: 70 - 99 mg/dL 197 (H) 588 (H)  1 unit NOVOLOG  139 (H)  1 unit NOVOLOG    Results for ADDAI, AFSHARI (MRN [id number]) as of 03/15/2018 14:07  Ref. Range 05/01/2017 12:43 03/14/2018 22:05  Hemoglobin A1C Latest Ref Range: 4.8 - 5.6 % 5.7 (H) 6.3 (H)    Admit with: Cellulitis and abscess of the right hand  History: DM, Polysubstance Abuse  Home DM Meds: Metformin 1000 mg Daily       Janumet 50/500 mg Daily  Current Orders: Novolog Sensitive Correction Scale/ SSI (0-9 units) TID AC + HS      Note patient brought from jail with Cellulitis and abscess of the right hand.  CBGs currently stable.  Current A1c of 6.3% shows decent glucose control as an outpatient.  No insulin recommendations at this time.    --Will follow patient during hospitalization--  Ambrose Finland RN, MSN, CDE Diabetes Coordinator Inpatient Glycemic Control Team Team Pager: [phone] (8a-5p)

## 2018-03-16 DIAGNOSIS — L02519 Cutaneous abscess of unspecified hand: Secondary | ICD-10-CM | POA: Diagnosis not present

## 2018-03-16 DIAGNOSIS — L03119 Cellulitis of unspecified part of limb: Secondary | ICD-10-CM | POA: Diagnosis present

## 2018-03-16 DIAGNOSIS — F419 Anxiety disorder, unspecified: Secondary | ICD-10-CM | POA: Diagnosis present

## 2018-03-16 DIAGNOSIS — F17210 Nicotine dependence, cigarettes, uncomplicated: Secondary | ICD-10-CM | POA: Diagnosis present

## 2018-03-16 DIAGNOSIS — Z79891 Long term (current) use of opiate analgesic: Secondary | ICD-10-CM | POA: Diagnosis not present

## 2018-03-16 DIAGNOSIS — F1920 Other psychoactive substance dependence, uncomplicated: Secondary | ICD-10-CM | POA: Diagnosis present

## 2018-03-16 DIAGNOSIS — E119 Type 2 diabetes mellitus without complications: Secondary | ICD-10-CM | POA: Diagnosis present

## 2018-03-16 DIAGNOSIS — L02511 Cutaneous abscess of right hand: Secondary | ICD-10-CM | POA: Diagnosis present

## 2018-03-16 DIAGNOSIS — Z8673 Personal history of transient ischemic attack (TIA), and cerebral infarction without residual deficits: Secondary | ICD-10-CM | POA: Diagnosis not present

## 2018-03-16 DIAGNOSIS — G894 Chronic pain syndrome: Secondary | ICD-10-CM | POA: Diagnosis present

## 2018-03-16 DIAGNOSIS — E785 Hyperlipidemia, unspecified: Secondary | ICD-10-CM | POA: Diagnosis present

## 2018-03-16 DIAGNOSIS — J45909 Unspecified asthma, uncomplicated: Secondary | ICD-10-CM | POA: Diagnosis present

## 2018-03-16 DIAGNOSIS — I10 Essential (primary) hypertension: Secondary | ICD-10-CM | POA: Diagnosis present

## 2018-03-16 DIAGNOSIS — F332 Major depressive disorder, recurrent severe without psychotic features: Secondary | ICD-10-CM | POA: Diagnosis present

## 2018-03-16 DIAGNOSIS — B1920 Unspecified viral hepatitis C without hepatic coma: Secondary | ICD-10-CM | POA: Diagnosis present

## 2018-03-16 DIAGNOSIS — Z79899 Other long term (current) drug therapy: Secondary | ICD-10-CM | POA: Diagnosis not present

## 2018-03-16 DIAGNOSIS — Z915 Personal history of self-harm: Secondary | ICD-10-CM | POA: Diagnosis not present

## 2018-03-16 DIAGNOSIS — G4733 Obstructive sleep apnea (adult) (pediatric): Secondary | ICD-10-CM | POA: Diagnosis present

## 2018-03-16 DIAGNOSIS — L03113 Cellulitis of right upper limb: Secondary | ICD-10-CM | POA: Diagnosis present

## 2018-03-16 DIAGNOSIS — K210 Gastro-esophageal reflux disease with esophagitis: Secondary | ICD-10-CM | POA: Diagnosis present

## 2018-03-16 DIAGNOSIS — Z7984 Long term (current) use of oral hypoglycemic drugs: Secondary | ICD-10-CM | POA: Diagnosis not present

## 2018-03-16 DIAGNOSIS — F909 Attention-deficit hyperactivity disorder, unspecified type: Secondary | ICD-10-CM | POA: Diagnosis present

## 2018-03-16 DIAGNOSIS — Z833 Family history of diabetes mellitus: Secondary | ICD-10-CM | POA: Diagnosis not present

## 2018-03-16 DIAGNOSIS — Z8249 Family history of ischemic heart disease and other diseases of the circulatory system: Secondary | ICD-10-CM | POA: Diagnosis not present

## 2018-03-16 DIAGNOSIS — Z6841 Body Mass Index (BMI) 40.0 and over, adult: Secondary | ICD-10-CM | POA: Diagnosis not present

## 2018-03-16 LAB — CBC
HCT: 44.7 % (ref 39.0–52.0)
HEMOGLOBIN: 14.4 g/dL (ref 13.0–17.0)
MCH: 29.7 pg (ref 26.0–34.0)
MCHC: 32.2 g/dL (ref 30.0–36.0)
MCV: 92.2 fL (ref 80.0–100.0)
Platelets: 202 10*3/uL (ref 150–400)
RBC: 4.85 MIL/uL (ref 4.22–5.81)
RDW: 13.5 % (ref 11.5–15.5)
WBC: 7.6 10*3/uL (ref 4.0–10.5)
nRBC: 0 % (ref 0.0–0.2)

## 2018-03-16 LAB — GLUCOSE, CAPILLARY
Glucose-Capillary: 141 mg/dL — ABNORMAL HIGH (ref 70–99)
Glucose-Capillary: 154 mg/dL — ABNORMAL HIGH (ref 70–99)
Glucose-Capillary: 154 mg/dL — ABNORMAL HIGH (ref 70–99)
Glucose-Capillary: 146 mg/dL — ABNORMAL HIGH (ref 70–99)

## 2018-03-16 LAB — HEMOGLOBIN A1C
Hgb A1c MFr Bld: 6.2 % — ABNORMAL HIGH (ref 4.8–5.6)
Mean Plasma Glucose: 131.24 mg/dL

## 2018-03-16 MED ORDER — LIDOCAINE-EPINEPHRINE 1 %-1:100000 IJ SOLN
30.0000 mL | Freq: Once | INTRAMUSCULAR | Status: DC
  Filled 2018-03-16: qty 30

## 2018-03-16 MED ORDER — ENOXAPARIN SODIUM 80 MG/0.8ML ~~LOC~~ SOLN
80.0000 mg | SUBCUTANEOUS | Status: DC
  Administered 2018-03-16 – 2018-03-17 (×2): 80 mg via SUBCUTANEOUS
  Filled 2018-03-16 (×2): qty 0.8

## 2018-03-16 NOTE — Consult Note (Signed)
Reason for Consult:R hand infection Referring Physician: Hospitalist  CC:My hand is infected  HPI:  Bryan Gallegos is an 43 y.o. right handed male who presents with R hand swelling, erythema, unresolved with antibiotics,now draining pus .  Denies any injuries. Pain is rated at   7 /10 and is described as sharp.  Pain is constant.  Pain is made better by rest/immobilization, worse with motion.   Associated signs/symptoms:  H/o spine osteo, had been on clindamycin, states he gets boils frequently. Previous treatment:    Past Medical History:  Diagnosis Date  . Adult ADHD   . Anaphylactic reaction   . Anxiety   . Asthma   . Bipolar disorder (HCC)   . Complication of anesthesia    Per patient difficult intubation;  . Diabetes mellitus   . Difficult intubation    Per patient  . Drug overdose   . Heart valve disorder    s/p echocardiogram  . Hyperlipidemia   . Hypertension   . Morbidly obese (HCC)   . OSA (obstructive sleep apnea)   . Polysubstance abuse (HCC)   . Transient cerebral ischemia    Unknown    Past Surgical History:  Procedure Laterality Date  . CARPAL TUNNEL RELEASE    . ESOPHAGOGASTRODUODENOSCOPY N/A 04/05/2015   Procedure: ESOPHAGOGASTRODUODENOSCOPY (EGD);  Surgeon: Malissa Hippo, MD;  Location: [facility];  Service: Endoscopy;  Laterality: N/A;  . ESOPHAGOGASTRODUODENOSCOPY (EGD) WITH PROPOFOL N/A 05/21/2015   Procedure: ESOPHAGOGASTRODUODENOSCOPY (EGD) WITH PROPOFOL;  Surgeon: Meryl Dare, MD;  Location: [facility];  Service: Endoscopy;  Laterality: N/A;  . NOSE SURGERY    . TOOTH EXTRACTION    . WISDOM TOOTH EXTRACTION      Family History  Problem Relation Age of Onset  . CAD Mother        Living  . Diabetes Mellitus II Mother   . Stroke Mother   . Hypertension Mother   . Congestive Heart Failure Mother   . Kidney disease Mother   . Fibromyalgia Mother   . Thyroid disease Mother   . Hyperlipidemia Mother   . Liver disease Mother    . Alcoholism Father 44       Deceased  . Arthritis Maternal Grandmother   . Congestive Heart Failure Maternal Grandmother   . Hypertension Maternal Grandmother   . Lung cancer Maternal Grandfather   . Colon cancer Maternal Aunt   . Stomach cancer Maternal Aunt   . Heart disease Other        Paternal & Maternal  . Crohn's disease Other   . Hypertension Other        Paternal & Maternal  . Hypertension Brother        x3  . Hypertension Sister        #1  . Bipolar disorder Sister        #1  . ADD / ADHD Son        x3  . Bipolar disorder Son        x3  . Asperger's syndrome Son     Social History:  reports that he has been smoking cigarettes. He has a 11.50 pack-year smoking history. He has never used smokeless tobacco. He reports previous drug use. Drugs: Benzodiazepines and Heroin. He reports that he does not drink alcohol.  Allergies:  Allergies  Allergen Reactions  . Ace Inhibitors Anaphylaxis  . Atenolol Anaphylaxis  . Lisinopril Anaphylaxis  . Prednisone Hives  . Ibuprofen Itching, Swelling and  Other (See Comments)    Hand/feet swelling   . Rexulti [Brexpiprazole] Swelling  . Tylenol [Acetaminophen] Other (See Comments)    Reaction:  GI bleeding     Medications: I have reviewed the patient's current medications.  Results for orders placed or performed during the hospital encounter of 03/14/18 (from the past 48 hour(s))  CBC     Status: None   Collection Time: 03/14/18 10:05 PM  Result Value Ref Range   WBC 6.6 4.0 - 10.5 K/uL   RBC 4.51 4.22 - 5.81 MIL/uL   Hemoglobin 13.3 13.0 - 17.0 g/dL   HCT 69.641.9 29.539.0 - 28.452.0 %   MCV 92.9 80.0 - 100.0 fL   MCH 29.5 26.0 - 34.0 pg   MCHC 31.7 30.0 - 36.0 g/dL   RDW 13.213.4 44.011.5 - 10.215.5 %   Platelets 185 150 - 400 K/uL   nRBC 0.0 0.0 - 0.2 %    Comment: Performed at [facility], [address] [place], [place] [address]  Creatinine, serum     Status: None   Collection Time: 03/14/18 10:05 PM  Result  Value Ref Range   Creatinine, Ser 0.86 0.61 - 1.24 mg/dL   GFR calc non Af Amer >60 >60 mL/min   GFR calc Af Amer >60 >60 mL/min    Comment: Performed at [facility], [address] [place], [place] [address]  Hemoglobin A1c     Status: Abnormal   Collection Time: 03/14/18 10:05 PM  Result Value Ref Range   Hgb A1c MFr Bld 6.3 (H) 4.8 - 5.6 %    Comment: (NOTE) Pre diabetes:          5.7%-6.4% Diabetes:              >6.4% Glycemic control for   <7.0% adults with diabetes    Mean Plasma Glucose 134.11 mg/dL    Comment: Performed at [facility], [address] [place], [place] [address]  Glucose, capillary     Status: Abnormal   Collection Time: 03/14/18 11:49 PM  Result Value Ref Range   Glucose-Capillary 108 (H) 70 - 99 mg/dL  MRSA PCR Screening     Status: None   Collection Time: 03/15/18  3:09 AM  Result Value Ref Range   MRSA by PCR NEGATIVE NEGATIVE    Comment:        The GeneXpert MRSA Assay (FDA approved for NASAL specimens only), is one component of a comprehensive MRSA colonization surveillance program. It is not intended to diagnose MRSA infection nor to guide or monitor treatment for MRSA infections. Performed at [facility], [address] [place], [place] [address]   Comprehensive metabolic panel     Status: Abnormal   Collection Time: 03/15/18  5:57 AM  Result Value Ref Range   Sodium 138 135 - 145 mmol/L   Potassium 4.4 3.5 - 5.1 mmol/L   Chloride 104 98 - 111 mmol/L   CO2 24 22 - 32 mmol/L   Glucose, Bld 123 (H) 70 - 99 mg/dL   BUN 9 6 - 20 mg/dL   Creatinine, Ser 7.560.83 0.61 - 1.24 mg/dL   Calcium 8.4 (L) 8.9 - 10.3 mg/dL   Total Protein 6.9 6.5 - 8.1 g/dL   Albumin 3.1 (L) 3.5 - 5.0 g/dL   AST 55 (H) 15 - 41 U/L   ALT 49 (H) 0 - 44 U/L   Alkaline Phosphatase 62 38 - 126 U/L  Total Bilirubin 1.0 0.3 - 1.2 mg/dL   GFR calc non Af Amer >60 >60 mL/min   GFR calc Af Amer >60 >60 mL/min   Anion gap 10  5 - 15    Comment: Performed at [facility], [address], [place], [place] [address]  CBC     Status: Abnormal   Collection Time: 03/15/18  5:57 AM  Result Value Ref Range   WBC 6.1 4.0 - 10.5 K/uL   RBC 4.49 4.22 - 5.81 MIL/uL   Hemoglobin 13.4 13.0 - 17.0 g/dL   HCT 78.4 69.6 - 29.5 %   MCV 95.5 80.0 - 100.0 fL   MCH 29.8 26.0 - 34.0 pg   MCHC 31.2 30.0 - 36.0 g/dL   RDW 28.4 13.2 - 44.0 %   Platelets 147 (L) 150 - 400 K/uL   nRBC 0.0 0.0 - 0.2 %    Comment: Performed at [facility], [address] [place], [place] [address]  Glucose, capillary     Status: Abnormal   Collection Time: 03/15/18  7:38 AM  Result Value Ref Range   Glucose-Capillary 131 (H) 70 - 99 mg/dL  Glucose, capillary     Status: Abnormal   Collection Time: 03/15/18 11:23 AM  Result Value Ref Range   Glucose-Capillary 139 (H) 70 - 99 mg/dL  Glucose, capillary     Status: Abnormal   Collection Time: 03/15/18  4:32 PM  Result Value Ref Range   Glucose-Capillary 125 (H) 70 - 99 mg/dL  Glucose, capillary     Status: Abnormal   Collection Time: 03/15/18  8:50 PM  Result Value Ref Range   Glucose-Capillary 130 (H) 70 - 99 mg/dL  CBC     Status: None   Collection Time: 03/16/18  5:28 AM  Result Value Ref Range   WBC 7.6 4.0 - 10.5 K/uL   RBC 4.85 4.22 - 5.81 MIL/uL   Hemoglobin 14.4 13.0 - 17.0 g/dL   HCT 53.6 64.4 - 03.4 %   MCV 92.2 80.0 - 100.0 fL   MCH 29.7 26.0 - 34.0 pg   MCHC 32.2 30.0 - 36.0 g/dL   RDW 74.2 59.5 - 63.8 %   Platelets 202 150 - 400 K/uL   nRBC 0.0 0.0 - 0.2 %    Comment: Performed at [facility], [address], [place], [place] [address]  Hemoglobin A1c     Status: Abnormal   Collection Time: 03/16/18  5:28 AM  Result Value Ref Range   Hgb A1c MFr Bld 6.2 (H) 4.8 - 5.6 %    Comment: (NOTE) Pre diabetes:          5.7%-6.4% Diabetes:              >6.4% Glycemic control for   <7.0% adults with diabetes    Mean  Plasma Glucose 131.24 mg/dL    Comment: Performed at [facility], [address], [place], [place] [address]  Glucose, capillary     Status: Abnormal   Collection Time: 03/16/18  7:54 AM  Result Value Ref Range   Glucose-Capillary 141 (H) 70 - 99 mg/dL  Glucose, capillary     Status: Abnormal   Collection Time: 03/16/18 11:45 AM  Result Value Ref Range   Glucose-Capillary 154 (H) 70 - 99 mg/dL  Glucose, capillary     Status: Abnormal   Collection Time: 03/16/18  6:06 PM  Result Value Ref Range   Glucose-Capillary 154 (  H) 70 - 99 mg/dL    No results found.  Pertinent items are noted in HPI. Temp:  [98.3 F (36.8 C)] 98.3 F (36.8 C) (01/16 0547) Pulse Rate:  [82-88] 82 (01/16 0547) Resp:  [18] 18 (01/16 0547) BP: (121-137)/(64-76) 137/76 (01/16 0547) SpO2:  [96 %-98 %] 97 % (01/16 1014) General appearance: alert and cooperative Cardio: regular rate and rhythm R Hand:  dorsal swelling, mild erythema, 4 scabs dorsally with 1  draining pus; area of swelling,erythema on volar ulnar wrist; these appear subcutaneous and not deep in tendon sheath or joint   Assessment: R Hand multiple boils/abscesses; h/o multiple infections (spine.Marland Kitchen.) Plan: Will attempt local I&D, if not tolerated will need formal I&d in OR; would also ?get ID's involvement with h/o infections. I have discussed this treatment plan in detail with patient, including the risks of the recommended treatment or surgery, the benefits and the alternatives.  The patient understands that additional treatment may be necessary.  Jakera Beaupre C Dylan Ruotolo 03/16/2018, 7:12 PM

## 2018-03-16 NOTE — Brief Op Note (Signed)
*   No surgery found *  7:59 PM  PATIENT:  Bryan Gallegos  43 y.o. male  PRE-OPERATIVE DIAGNOSIS:  Multiple abscesses R Hand  POST-OPERATIVE DIAGNOSIS:  Same  PROCEDURE:  I&D 3 abscesses dorsal hand, 1 abscess volar ulnar wrist  SURGEON: Gabriella Guile ANESTHESIA:   local  SPECIMEN:  Source of Specimen:  abscess culture  FINDINGS: subcutaneous abscesses, small amount of pus drsal , mod pus volar abscess   PATIENT DISPOSITION:  pt tolerated well

## 2018-03-16 NOTE — Progress Notes (Signed)
Culture swab taken from right hand by Dr. Izora Ribas, sent to lab.

## 2018-03-16 NOTE — Progress Notes (Addendum)
[facility] Progress Note  Subjective: no c/o this am, still having chills. Pus draining from back of hand in 2 spots today  Vitals:   03/15/18 2010 03/15/18 2101 03/16/18 0547 03/16/18 1014  BP:  121/64 137/76   Pulse:  88 82   Resp:  18 18   Temp:  98.3 F (36.8 C) 98.3 F (36.8 C)   TempSrc:      SpO2: 96% 98% 97% 97%  Weight:      Height:        Inpatient medications: . atorvastatin  20 mg Oral QHS  . cloNIDine  0.2 mg Oral BID  . enoxaparin (LOVENOX) injection  80 mg Subcutaneous Q24H  . gabapentin  800 mg Oral TID  . hydrochlorothiazide  25 mg Oral Daily  . insulin aspart  0-5 Units Subcutaneous QHS  . insulin aspart  0-9 Units Subcutaneous TID WC  . metFORMIN  1,000 mg Oral Q supper  . sitaGLIPtin  50 mg Oral Q breakfast   And  . metFORMIN  500 mg Oral Q breakfast  . mometasone-formoterol  2 puff Inhalation BID  . nicotine  21 mg Transdermal Daily  . Oxcarbazepine  300 mg Oral Daily  . pantoprazole  40 mg Oral Daily  . tiZANidine  4 mg Oral TID  . traZODone  200 mg Oral QHS  . ziprasidone  80 mg Oral BID WC   . sodium chloride 75 mL/hr at 03/15/18 2344  . vancomycin 1,250 mg (03/16/18 0540)   albuterol, clonazePAM, HYDROcodone-acetaminophen, hydrOXYzine, morphine injection, ondansetron **OR** ondansetron (ZOFRAN) IV, oxyCODONE  Exam: Gen: alert, in bed not in distress Eyes: PERRL, lids and conjunctivae normal ENMT: Mucous membranes are moist. Posterior pharynx clear of any exudate or lesions.Normal dentition.  Neck: normal, supple, no masses, no thyromegaly Respiratory: clear to auscultation bilaterally, no wheezing, no crackles. Normal respiratory effort. No accessory muscle use.  Cardiovascular: Regular rate and rhythm, no murmurs / rubs / gallops. No extremity edema. 2+ pedal pulses. No carotid bruits.  Abdomen: no tenderness, no masses palpated. No hepatosplenomegaly. BS + Musculoskeletal: Right back of the hand is swollen, tender, red, multiple  areas of pustules are disappearing, no frank pus or fluctuance today, normal muscles Skin: no rashes, lesions, ulcers. No induration Neurologic: CN 2-12 grossly intact. Sensation intact, DTR normal. Strength 5/5x4     Presentation Summary: Bryan Gallegos is a 43 y.o. male with medical history significant of bipolar disorder, asthma, hyperlipidemia, morbid obesity, obstructive sleep apnea, polysubstance abuse, previous skin infection and ADHD who was brought in from jail where he went today with right hand swelling tenderness and pain.  Patient is symptoms started about a week ago with a small area at the back of his hand which looked like a boil.  It has since progressed to involve the whole back of his hand.  Swollen tender and painful.  He had low-grade temperature.  Patient also has pain rated as 10 out of 10.  No discharge from the hand.  Denied any injury or animal bite. He was arrested today and taken to jail something having to do with his "bond running out". But then he was brought back to the ER due to the current symptoms  ED Course: Temperature is 98.1 blood pressure one 116/81 pulse 94 respiratory of 16 his sats 91% room air.  CBC and chemistry all appear to be within normal.  Chest x-ray showed no active findings.  X-ray of the right hand shows severe dorsal  soft tissue swelling.  Patient has a linear metallic foreign body seen on the ulnar side of the carpal bones but no fractures.       Hospital Problems/ Course: Principal Problem:   Cellulitis of hand, right Active Problems:   HLD (hyperlipidemia)   OSA on CPAP   Gastroesophageal reflux disease with esophagitis   Diabetes mellitus type II, controlled (HCC)   Morbid obesity (HCC)   Tobacco use disorder   Polysubstance dependence including opioid type drug, episodic abuse (HCC)   Hepatitis C infection   MDD (major depressive disorder), recurrent severe, without psychosis (HCC)    Hosp Course:  #1 Cellulitis and  abscess of the right hand: Patient w/ significant cellulitis of the back of the R hand and pt reported drainage of pus prior to admission. He is a diabetic. Pt afeb w/ normal WBC since admission.  - cellulitis improved but still sig pain and swelling and today pustules are draining now on the back of the hand and the area over the wrist remains fluctuant >> will consult hand surgeon - cont IV vancomycin  #2 obstructive sleep apnea: Patient uses CPAP at home and will provide.  #3 diabetes: Resume home metformin and Janumet.  Initiate sliding scale insulin. Check Hb A1C.  Consult Diabetes team.  Well controlled today.   #4 morbid obesity: Dietary counseling.  #5 hyperlipidemia: Continue with home regimen.  #6 polysubstance abuse: Including tobacco.  Nicotine patch will be added.  #7 depression with anxiety: resumed depression/ anxiety meds         Bryan Moselleob [name] MD [facility] pgr [phone] 03/16/2018, 12:05 PM   DVT prophylaxis: Heparin Code Status: Full code Family Communication: No family available discussed care with patient Disposition Plan: Back to jail when better Consults called: Hand surgery  Admission status: Inpatient     Bryan Moselleob Joss Friedel MD [facility] pgr [phone] 03/16/2018, 12:05 PM   Recent Labs  Lab 03/14/18 1152 03/14/18 2205 03/15/18 0557  NA 135  --  138  K 4.1  --  4.4  CL 100  --  104  CO2 27  --  24  GLUCOSE 116*  --  123*  BUN 10  --  9  CREATININE 0.89 0.86 0.83  CALCIUM 8.4*  --  8.4*   Recent Labs  Lab 03/14/18 1152 03/15/18 0557  AST 53* 55*  ALT 52* 49*  ALKPHOS 68 62  BILITOT 1.0 1.0  PROT 7.3 6.9  ALBUMIN 3.4* 3.1*   Recent Labs  Lab 03/14/18 1152 03/14/18 2205 03/15/18 0557 03/16/18 0528  WBC 6.3 6.6 6.1 7.6  NEUTROABS 2.4  --   --   --   HGB 13.8 13.3 13.4 14.4  HCT 43.2 41.9 42.9 44.7  MCV 93.7 92.9 95.5 92.2  PLT 161 185 147* 202   Iron/TIBC/Ferritin/ %Sat No results found  for: IRON, TIBC, FERRITIN, IRONPCTSAT

## 2018-03-17 LAB — CBC
HCT: 42.6 % (ref 39.0–52.0)
Hemoglobin: 14.1 g/dL (ref 13.0–17.0)
MCH: 29.6 pg (ref 26.0–34.0)
MCHC: 33.1 g/dL (ref 30.0–36.0)
MCV: 89.5 fL (ref 80.0–100.0)
NRBC: 0.3 % — AB (ref 0.0–0.2)
PLATELETS: 190 10*3/uL (ref 150–400)
RBC: 4.76 MIL/uL (ref 4.22–5.81)
RDW: 13.6 % (ref 11.5–15.5)
WBC: 8.9 10*3/uL (ref 4.0–10.5)

## 2018-03-17 LAB — GLUCOSE, CAPILLARY
Glucose-Capillary: 124 mg/dL — ABNORMAL HIGH (ref 70–99)
Glucose-Capillary: 145 mg/dL — ABNORMAL HIGH (ref 70–99)
Glucose-Capillary: 118 mg/dL — ABNORMAL HIGH (ref 70–99)
Glucose-Capillary: 110 mg/dL — ABNORMAL HIGH (ref 70–99)

## 2018-03-17 NOTE — Progress Notes (Signed)
Pt is s/p I&D on right hand, initial dressing change, with minimal amount of puss drained out on dorsum part of the hand (closest to the thumb); no pus noticed the rest of the incision. Skin is red red around the site, tender to touch; wound cleansed with NS, xorofoam, gauze and kerlix applied; pt was medicated for pain; tolerated the procedure.

## 2018-03-17 NOTE — Progress Notes (Signed)
Pharmacy Antibiotic Note  Bryan Gallegos is a 43 y.o. male admitted on 03/14/2018 with cellulitis.  Pharmacy has been consulted for vancomycin dosing.  Plan: Vancomycin 2500 mg IV x1, then 1250 q8h   Renal function has been stable. Will order SCr in AM.  Monitor clinical course, renal function, cultures as available   Height: 6\' 2"  (188 cm) Weight: (!) 365 lb (165.6 kg) IBW/kg (Calculated) : 82.2  Temp (24hrs), Avg:97.6 F (36.4 C), Min:97.3 F (36.3 C), Max:97.8 F (36.6 C)  Recent Labs  Lab 03/14/18 1152 03/14/18 1201 03/14/18 2205 03/15/18 0557 03/16/18 0528 03/17/18 0557  WBC 6.3  --  6.6 6.1 7.6 8.9  CREATININE 0.89  --  0.86 0.83  --   --   LATICACIDVEN  --  0.89  --   --   --   --     Estimated Creatinine Clearance: 185.7 mL/min (by C-G formula based on SCr of 0.83 mg/dL).    Allergies  Allergen Reactions  . Ace Inhibitors Anaphylaxis  . Atenolol Anaphylaxis  . Lisinopril Anaphylaxis  . Prednisone Hives  . Ibuprofen Itching, Swelling and Other (See Comments)    Hand/feet swelling   . Rexulti [Brexpiprazole] Swelling  . Tylenol [Acetaminophen] Other (See Comments)    Reaction:  GI bleeding     Antimicrobials this admission: Doxycycline x1 in ED 1/14 Vancomycin 1/14 >>   Dose adjustments this admission:   Microbiology results: 1/15 MRSA PCR: neg 1/16 abscess culture: few Gr+ cocci   Thank you for allowing pharmacy to be a part of this patient's care.   Adalberto Cole, PharmD, BCPS Pager [phone] 03/17/2018 11:24 AM

## 2018-03-17 NOTE — Care Management Important Message (Signed)
Important Message  Patient Details  Name: Bryan Gallegos MRN: [id number] Date of Birth: 1975/05/26   Medicare Important Message Given:  Yes. CMA cannot obtain patient's signature at this time. CMA gave patient their IM and explained their Medicare Rights.     Vane Yapp 03/17/2018, 9:06 AM

## 2018-03-17 NOTE — Progress Notes (Signed)
PROGRESS NOTE    Bryan Gallegos  TML:[id number] DOB: 08-05-1975 DOA: 03/14/2018 PCP: Neldon Labella, PA  Outpatient Specialists:   Brief Narrative: Bryan Gallegos is a 43 y.o. male with medical history significant of bipolar disorder, asthma, hyperlipidemia, morbid obesity, obstructive sleep apnea, polysubstance abuse, previous skin infection and ADHD who was [facility] in from jail where he went today with right hand swelling tenderness and pain.  Due to pussy discharge, hand surgery/orthopedic team was consulted.  Patient underwent I&D yesterday.  Right hand is dressed.  Further management will depend on hospital course.  We will continue antibiotics.  Follow cultures.  Post I&D care as per the hand surgery team.  Assessment & Plan:   Principal Problem:   Cellulitis and abscess of hand Active Problems:   HLD (hyperlipidemia)   OSA on CPAP   Gastroesophageal reflux disease with esophagitis   Type 2 diabetes mellitus with hemoglobin A1c goal of less than 7.0% (HCC)   Morbid obesity (HCC)   Tobacco use disorder   Polysubstance dependence including opioid type drug, episodic abuse (HCC)   Hepatitis C infection   MDD (major depressive disorder), recurrent severe, without psychosis (HCC)   Cellulitis of hand   Cellulitis and abscess of the right hand: Patient w/ significant cellulitis of the back of the R hand and pt reported drainage of pus prior to admission. He is a diabetic. Pt afeb w/ normal WBC since admission.  - cellulitis improved but still sig pain and swelling and today pustules are draining now on the back of the hand and the area over the wrist remains fluctuant >> will consult hand surgeon - cont IV vancomycin 03/17/2018: Patient underwent I&D by the hand surgery team yesterday.  Continue postop care.  #2 obstructive sleep apnea:Patient uses CPAP at home and will provide.  #3 diabetes: Resume home metformin and Janumet. Initiate sliding scale insulin.  Check Hb A1C.  Consult Diabetes team.  Well controlled today.  03/17/2018: Continue to optimize.  #4 morbid obesity: Dietary counseling.  #5 hyperlipidemia: Continue with home regimen.  #6 polysubstance abuse:Including tobacco. Nicotine patch will be added.  #7 depression with anxiety:resumed depression/ anxiety meds   DVT prophylaxis:Heparin Code Status:Full code Family Communication: Disposition Plan:Back to jail when better Consults called:Hand surgery    Procedures:   Incision and drainage.  Antimicrobials:   Vancomycin.   Subjective: No new complaints. No fever or chills.  Objective: Vitals:   03/16/18 2054 03/17/18 0604 03/17/18 0752 03/17/18 1322  BP: 139/68 105/61  (!) 100/55  Pulse: 96 70 61 85  Resp: 18 18 17 16   Temp: 97.8 F (36.6 C) (!) 97.3 F (36.3 C)  98.1 F (36.7 C)  TempSrc: Oral Oral  Oral  SpO2: 96% 97% 90% 98%  Weight:      Height:        Intake/Output Summary (Last 24 hours) at 03/17/2018 1707 Last data filed at 03/17/2018 1701 Gross per 24 hour  Intake 3160.32 ml  Output 1700 ml  Net 1460.32 ml   Filed Weights   03/14/18 1654  Weight: (!) 165.6 kg    Examination:  General exam: Appears calm and comfortable.  Patient is morbidly obese. Respiratory system: Clear to auscultation.  Cardiovascular system: S1 & S2 heard Gastrointestinal system: Abdomen is morbidly obese, soft and nontender.  Organs are difficult to assess.   Central nervous system: Alert and oriented. No focal neurological deficits.   Data Reviewed: I have personally reviewed following labs and imaging studies  CBC: Recent Labs  Lab 03/14/18 1152 03/14/18 2205 03/15/18 0557 03/16/18 0528 03/17/18 0557  WBC 6.3 6.6 6.1 7.6 8.9  NEUTROABS 2.4  --   --   --   --   HGB 13.8 13.3 13.4 14.4 14.1  HCT 43.2 41.9 42.9 44.7 42.6  MCV 93.7 92.9 95.5 92.2 89.5  PLT 161 185 147* 202 190   Basic Metabolic Panel: Recent Labs  Lab 03/14/18 1152  03/14/18 2205 03/15/18 0557  NA 135  --  138  K 4.1  --  4.4  CL 100  --  104  CO2 27  --  24  GLUCOSE 116*  --  123*  BUN 10  --  9  CREATININE 0.89 0.86 0.83  CALCIUM 8.4*  --  8.4*   GFR: Estimated Creatinine Clearance: 185.7 mL/min (by C-G formula based on SCr of 0.83 mg/dL). Liver Function Tests: Recent Labs  Lab 03/14/18 1152 03/15/18 0557  AST 53* 55*  ALT 52* 49*  ALKPHOS 68 62  BILITOT 1.0 1.0  PROT 7.3 6.9  ALBUMIN 3.4* 3.1*   No results for input(s): LIPASE, AMYLASE in the last 168 hours. No results for input(s): AMMONIA in the last 168 hours. Coagulation Profile: No results for input(s): INR, PROTIME in the last 168 hours. Cardiac Enzymes: No results for input(s): CKTOTAL, CKMB, CKMBINDEX, TROPONINI in the last 168 hours. BNP (last 3 results) No results for input(s): PROBNP in the last 8760 hours. HbA1C: Recent Labs    03/14/18 2205 03/16/18 0528  HGBA1C 6.3* 6.2*   CBG: Recent Labs  Lab 03/16/18 1806 03/16/18 2058 03/17/18 0734 03/17/18 1102 03/17/18 1700  GLUCAP 154* 146* 124* 145* 118*   Lipid Profile: No results for input(s): CHOL, HDL, LDLCALC, TRIG, CHOLHDL, LDLDIRECT in the last 72 hours. Thyroid Function Tests: No results for input(s): TSH, T4TOTAL, FREET4, T3FREE, THYROIDAB in the last 72 hours. Anemia Panel: No results for input(s): VITAMINB12, FOLATE, FERRITIN, TIBC, IRON, RETICCTPCT in the last 72 hours. Urine analysis:    Component Value Date/Time   COLORURINE AMBER (A) 03/14/2018 1136   APPEARANCEUR CLEAR 03/14/2018 1136   LABSPEC 1.025 03/14/2018 1136   PHURINE 5.0 03/14/2018 1136   GLUCOSEU NEGATIVE 03/14/2018 1136   HGBUR NEGATIVE 03/14/2018 1136   BILIRUBINUR NEGATIVE 03/14/2018 1136   KETONESUR NEGATIVE 03/14/2018 1136   PROTEINUR NEGATIVE 03/14/2018 1136   UROBILINOGEN 1.0 08/18/2014 2002   NITRITE NEGATIVE 03/14/2018 1136   LEUKOCYTESUR NEGATIVE 03/14/2018 1136   Sepsis  Labs: @LABRCNTIP (procalcitonin:4,lacticidven:4)  ) Recent Results (from the past 240 hour(s))  MRSA PCR Screening     Status: None   Collection Time: 03/15/18  3:09 AM  Result Value Ref Range Status   MRSA by PCR NEGATIVE NEGATIVE Final    Comment:        The GeneXpert MRSA Assay (FDA approved for NASAL specimens only), is one component of a comprehensive MRSA colonization surveillance program. It is not intended to diagnose MRSA infection nor to guide or monitor treatment for MRSA infections. Performed at [facility], [address] [place], [place] [address]   Aerobic Culture (superficial specimen)     Status: None (Preliminary result)   Collection Time: 03/16/18  8:06 PM  Result Value Ref Range Status   Specimen Description   Final    ABSCESS RIGHT HAND Performed at [facility], [address] [place], [place] [address]    Special Requests   Final    NONE Performed at  [facility], [address] [place], [place] [address]    Gram Stain   Final    ABUNDANT WBC PRESENT, PREDOMINANTLY PMN FEW GRAM POSITIVE COCCI Performed at [facility], [address], [place], [place] [address]    Culture PENDING  Incomplete   Report Status PENDING  Incomplete         Radiology Studies: No results found.      Scheduled Meds: . atorvastatin  20 mg Oral QHS  . cloNIDine  0.2 mg Oral BID  . enoxaparin (LOVENOX) injection  80 mg Subcutaneous Q24H  . gabapentin  800 mg Oral TID  . hydrochlorothiazide  25 mg Oral Daily  . insulin aspart  0-5 Units Subcutaneous QHS  . insulin aspart  0-9 Units Subcutaneous TID WC  . lidocaine-EPINEPHrine  30 mL Infiltration Once  . metFORMIN  1,000 mg Oral Q supper  . sitaGLIPtin  50 mg Oral Q breakfast   And  . metFORMIN  500 mg Oral Q breakfast  . mometasone-formoterol  2 puff Inhalation BID  . nicotine  21 mg Transdermal Daily  . Oxcarbazepine  300 mg Oral Daily  . pantoprazole   40 mg Oral Daily  . tiZANidine  4 mg Oral TID  . traZODone  200 mg Oral QHS  . ziprasidone  80 mg Oral BID WC   Continuous Infusions: . sodium chloride Stopped (03/17/18 1335)  . vancomycin 1,250 mg (03/17/18 1335)     LOS: 2 days    Time spent: 25 minutes.    Berton Mount, MD  [facility] Pager #: [phone] 7PM-7AM contact night coverage as above

## 2018-03-18 LAB — CBC
HCT: 44.7 % (ref 39.0–52.0)
HEMOGLOBIN: 14 g/dL (ref 13.0–17.0)
MCH: 29.4 pg (ref 26.0–34.0)
MCHC: 31.3 g/dL (ref 30.0–36.0)
MCV: 93.9 fL (ref 80.0–100.0)
NRBC: 0 % (ref 0.0–0.2)
Platelets: 245 10*3/uL (ref 150–400)
RBC: 4.76 MIL/uL (ref 4.22–5.81)
RDW: 13.5 % (ref 11.5–15.5)
WBC: 8.6 10*3/uL (ref 4.0–10.5)

## 2018-03-18 LAB — GLUCOSE, CAPILLARY
Glucose-Capillary: 117 mg/dL — ABNORMAL HIGH (ref 70–99)
Glucose-Capillary: 131 mg/dL — ABNORMAL HIGH (ref 70–99)

## 2018-03-18 LAB — CREATININE, SERUM
Creatinine, Ser: 1.16 mg/dL (ref 0.61–1.24)
GFR calc Af Amer: >60 mL/min (ref >60)
GFR calc non Af Amer: >60 mL/min (ref >60)

## 2018-03-18 MED ORDER — SITAGLIPTIN PHOSPHATE 50 MG PO TABS: 50.0000 mg | ORAL_TABLET | Freq: Every day | ORAL | 0 refills | Status: DC

## 2018-03-18 MED ORDER — METFORMIN HCL 1000 MG PO TABS: 1000.0000 mg | ORAL_TABLET | Freq: Two times a day (BID) | ORAL | 0 refills | Status: DC

## 2018-03-18 MED ORDER — LINEZOLID 600 MG PO TABS: 600.0000 mg | ORAL_TABLET | Freq: Two times a day (BID) | ORAL | 0 refills | Status: DC

## 2018-03-18 NOTE — Discharge Summary (Signed)
Physician Discharge Summary  Patient ID: Bryan Gallegos MRN: [id number] DOB/AGE: 04/06/1975 43 y.o.  Admit date: 03/14/2018 Discharge date: 03/18/2018  Admission Diagnoses:  Discharge Diagnoses:  Principal Problem:   Cellulitis and abscess of hand Active Problems:   HLD (hyperlipidemia)   OSA on CPAP   Gastroesophageal reflux disease with esophagitis   Type 2 diabetes mellitus with hemoglobin A1c goal of less than 7.0% (HCC)   Morbid obesity (HCC)   Tobacco use disorder   Polysubstance dependence including opioid type drug, episodic abuse (HCC)   Hepatitis C infection   MDD (major depressive disorder), recurrent severe, without psychosis (HCC)   Cellulitis of hand   Discharged Condition: stable  Hospital Course:  Tavio Majka a 43 year old male, morbidly obese, with past medical history significant forbipolar disorder, asthma, hyperlipidemia, morbid obesity, obstructive sleep apnea, polysubstance abuse, previous skin infection and ADHD.  Patient presented to the hospital from the jail with right hand swelling, tenderness and pain with pussy discharge.  Patient was admitted for further assessment and management.  Patient was started on broad-spectrum antibiotics (IV vancomycin).  Due to the pussy discharge, hand surgery/orthopedic team was consulted.  Patient underwent I&D of the right hand abscess.  Patient's right hand wound has improved significantly.  Patient will be discharged back to the jail on oral Zyvox.  Patient will follow with the primary care provider within 1 week of discharge.  Strict blood sugar control was also advised.  Patient is a diabetic.   Cellulitis and abscess of the right hand:  -Patientwith significant cellulitis of the back of the R hand and pt reported drainage of pusprior to admission.  -Patient is a nondiabetic.   -Patient was managed with IV antibiotics (IV vancomycin).   -Surgical team was consulted for I&D.   -Patient was  seen by the hand surgeon, and underwent I&D of the right hand abscess.   -Patient has improved significantly and will be discharged back to the jail on oral antibiotics (Zyvox).   -Ensure aggressive blood sugar control.    Morbid obesity/obstructive sleep apnea/possible undiagnosed OHS: Patient is noncompliant with CPAP.   Patient definitely needs to lose weight.   Diet and exercise whenever feasible.    Diabetes mellitus: Strict blood sugar management.   Hyperlipidemia:   Continue with home regimen.  Hepatitis C:  Continue Harvoni.    Tobacco abuse: Counseled.  Consults: orthopedic surgery (hand surgery)  Significant Diagnostic Studies:  HbA1c was 6.2%.   Discharge Exam: Blood pressure (!) 106/56, pulse 78, temperature 98.2 F (36.8 C), resp. rate 20, height 6\' 2"  (1.88 m), weight (!) 165.6 kg, SpO2 95 %.  Disposition: Discharge disposition: 01-Home or Self Care   Discharge Instructions    Diet - low sodium heart healthy   Complete by:  As directed    Diet Carb Modified   Complete by:  As directed    Increase activity slowly   Complete by:  As directed      Allergies as of 03/18/2018      Reactions   Ace Inhibitors Anaphylaxis   Atenolol Anaphylaxis   Lisinopril Anaphylaxis   Prednisone Hives   Ibuprofen Itching, Swelling, Other (See Comments)   Hand/feet swelling    Rexulti [brexpiprazole] Swelling   Tylenol [acetaminophen] Other (See Comments)   Reaction:  GI bleeding       Medication List    STOP taking these medications   doxycycline 100 MG capsule Commonly known as:  VIBRAMYCIN   ondansetron 4 MG  disintegrating tablet Commonly known as:  ZOFRAN ODT   oxyCODONE-acetaminophen 5-325 MG tablet Commonly known as:  PERCOCET/ROXICET   sitaGLIPtin-metformin 50-500 MG tablet Commonly known as:  JANUMET     TAKE these medications   albuterol 108 (90 Base) MCG/ACT inhaler Commonly known as:  PROVENTIL HFA;VENTOLIN HFA Inhale 1-2 puffs into the  lungs every 6 (six) hours as needed for wheezing or shortness of breath.   amphetamine-dextroamphetamine 30 MG tablet Commonly known as:  ADDERALL Take 60 mg by mouth daily.   atorvastatin 20 MG tablet Commonly known as:  LIPITOR Take 1 tablet (20 mg total) by mouth at bedtime. For high cholesterol   clonazePAM 1 MG tablet Commonly known as:  KLONOPIN Take 1 mg by mouth 3 (three) times daily as needed for anxiety (panic attacks).   cloNIDine 0.2 MG tablet Commonly known as:  CATAPRES Take 0.2 mg by mouth 2 (two) times daily.   EYE DROPS OP Apply 2 drops to eye every 3 (three) hours as needed (dry eyes).   Fluticasone-Salmeterol 100-50 MCG/DOSE Aepb Commonly known as:  ADVAIR Inhale 2 puffs into the lungs 2 (two) times daily.   gabapentin 400 MG capsule Commonly known as:  NEURONTIN Take 2 capsules (800 mg total) by mouth 3 (three) times daily. For agitation/Neuropathy   hydrochlorothiazide 25 MG tablet Commonly known as:  HYDRODIURIL Take 25 mg by mouth daily.   hydrOXYzine 25 MG capsule Commonly known as:  VISTARIL Take 25 mg by mouth 2 (two) times daily as needed.   Ledipasvir-Sofosbuvir 90-400 MG Tabs Commonly known as:  HARVONI Take 1 tablet by mouth daily.   linezolid 600 MG tablet Commonly known as:  ZYVOX Take 1 tablet (600 mg total) by mouth 2 (two) times daily.   metFORMIN 1000 MG tablet Commonly known as:  GLUCOPHAGE Take 1 tablet (1,000 mg total) by mouth 2 (two) times daily with a meal. What changed:  when to take this   omeprazole 40 MG capsule Commonly known as:  PRILOSEC Take 1 capsule (40 mg total) by mouth 2 (two) times daily. For acid reflux   Oxcarbazepine 300 MG tablet Commonly known as:  TRILEPTAL Take 300 mg by mouth daily.   oxyCODONE 15 MG immediate release tablet Commonly known as:  ROXICODONE Take 15 mg by mouth every 6 (six) hours as needed (severe chronic back pain).   sitaGLIPtin 50 MG tablet Commonly known as:  JANUVIA Take  1 tablet (50 mg total) by mouth daily with breakfast. Start taking on:  March 19, 2018   tiZANidine 4 MG capsule Commonly known as:  ZANAFLEX Take 4 mg by mouth 3 (three) times daily as needed for muscle spasms.   traZODone 100 MG tablet Commonly known as:  DESYREL Take 200 mg by mouth at bedtime.   ziprasidone 80 MG capsule Commonly known as:  GEODON Take 80 mg by mouth 2 (two) times daily with a meal.        Signed: Barnetta ChapelSylvester I Mavin Dyke 03/18/2018, 10:54 AM

## 2018-03-18 NOTE — Progress Notes (Signed)
Pt discharged to [facility]. DC instruction given. Placed in discharge envelope and given to prison guard.No concerns voiced. Left unit in wheelchair pushed by prison guard. Dressing change done to right hand prior to discharge. Left in stable condition.  VWilliams,RN.

## 2018-03-19 LAB — AEROBIC CULTURE W GRAM STAIN (SUPERFICIAL SPECIMEN)

## 2018-03-19 IMAGING — DX DG CHEST 2V
2 series · 2 of 2 positions shown · non-contrast
Comparison: 01/05/2016

CLINICAL DATA: Shortness of breath, chest pain, and cold sweats for
1 day. Productive cough. Hypertension, diabetes, smoker.

EXAM:
CHEST  2 VIEW

[chest pa]
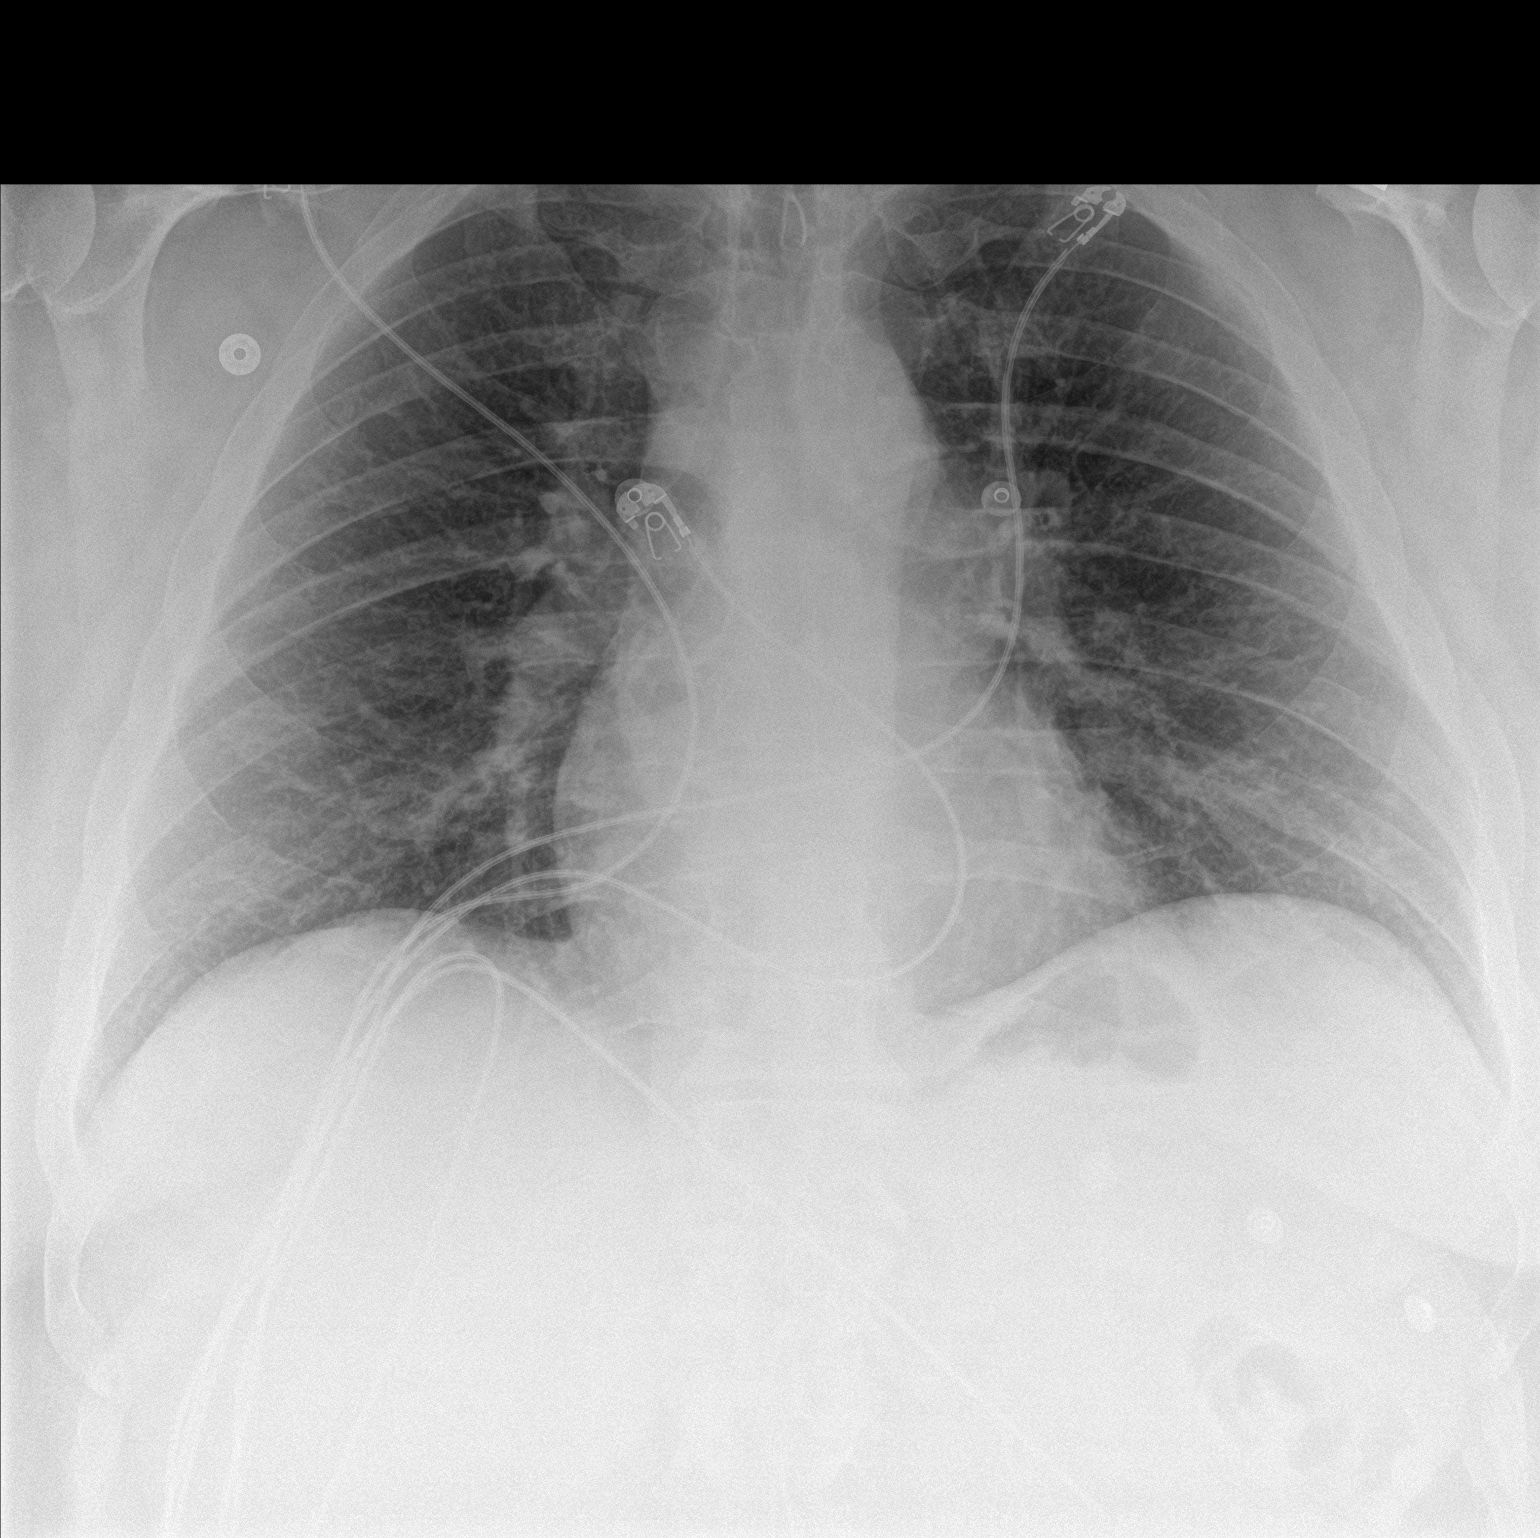

[chest lat]
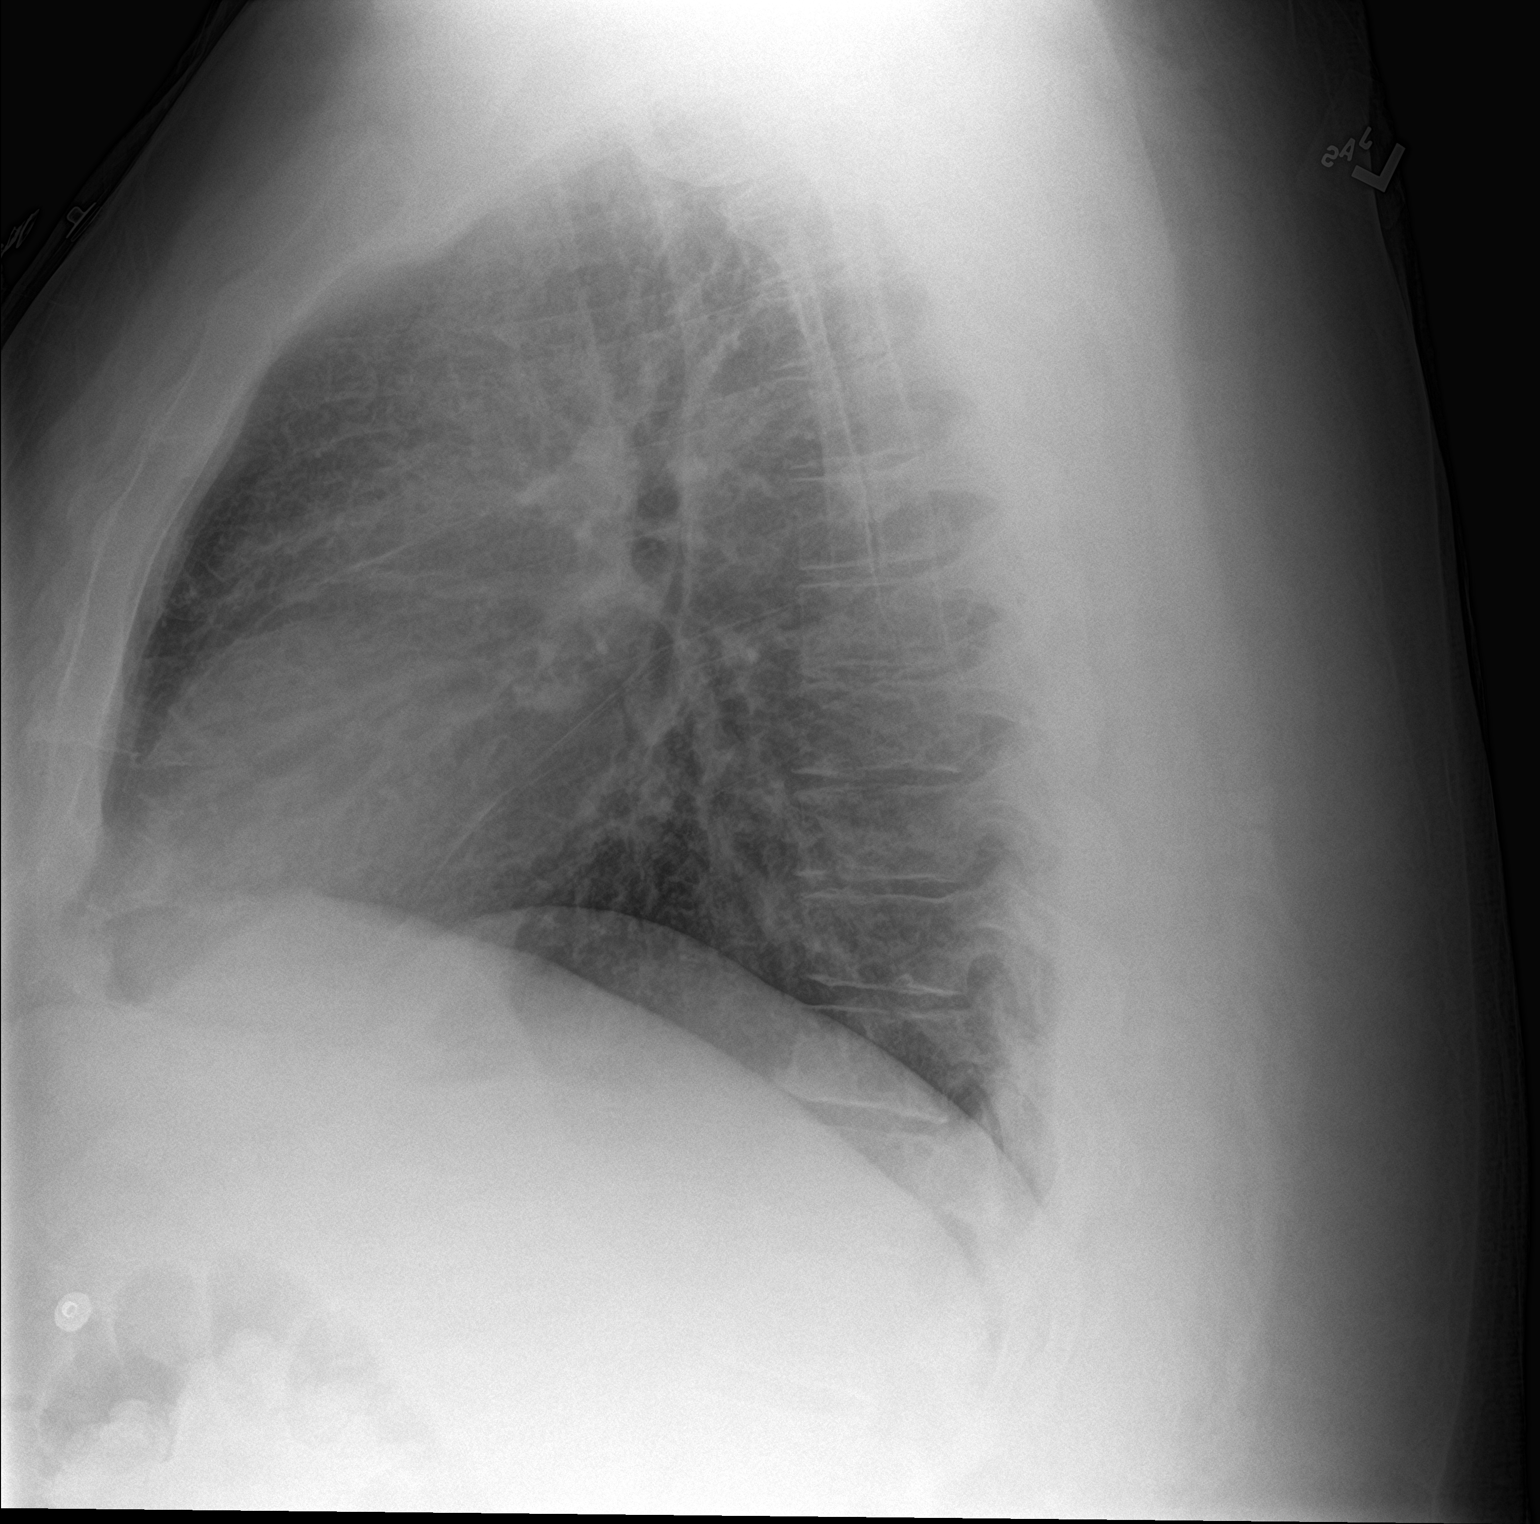

[2 of 2 positions shown; findings below may reference images not displayed]

FINDINGS: The heart size and mediastinal contours are within normal limits.
Both lungs are clear. The visualized skeletal structures are
unremarkable.
IMPRESSION: No active cardiopulmonary disease.

## 2018-03-19 IMAGING — CT CT ANGIO CHEST
1 series · 1 of 2 positions shown · IV contrast (isovue)
Comparison: 10/25/2015

CLINICAL DATA: Shortness of breath starting yesterday.  Chest pain.

EXAM:
CT ANGIOGRAPHY CHEST WITH CONTRAST
TECHNIQUE: Multidetector CT imaging of the chest was performed using the
standard protocol during bolus administration of intravenous
contrast. Multiplanar CT image reconstructions and MIPs were
obtained to evaluate the vascular anatomy.
CONTRAST:  100 mL Isovue 370. Patient's IV infiltrated at 50 mL.
Technologist note indicates that nurse was notified and safety zone
filled out.

[Series 100: scout · sagittal · 0.6mm · 0.98mm/px · 1 of 2 slices shown]
[im 2/2]
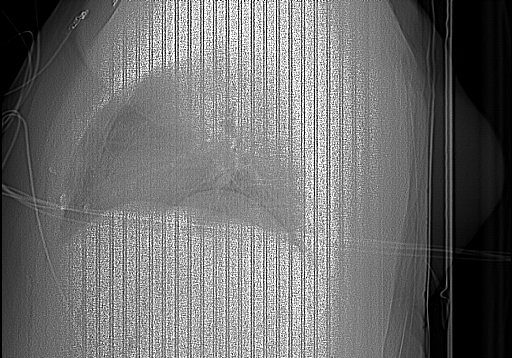

[1 of 2 positions shown; findings below may reference images not displayed]

FINDINGS: Cardiovascular: Suboptimal contrast bolus. Examination is
indeterminate for evaluation for pulmonary embolus. Normal heart
size. Normal caliber thoracic aorta. No pericardial effusion.

Mediastinum/Nodes: No enlarged mediastinal, hilar, or axillary lymph
nodes. Thyroid gland, trachea, and esophagus demonstrate no
significant findings.

Lungs/Pleura: Evaluation is limited due to motion artifact. No focal
consolidation. No pleural effusions. No pneumothorax. There is a
nodule in the right lung base measuring 6 mm diameter. No change
since prior study.

Upper Abdomen: No gross abnormalities identified although
visualization is limited due technique.

Musculoskeletal: Degenerative changes in the spine. No destructive
bone lesions.

Review of the MIP images confirms the above findings.
IMPRESSION: Examination is indeterminate for evaluation of pulmonary embolus due
to suboptimal contrast bolus. No evidence of active pulmonary
disease.

**An incidental finding of potential clinical significance has been
found. 6 mm nodule demonstrated in the right lower lung, unchanged
since prior study. Non-contrast chest CT at 18-24 months (from
today's scan) is considered optional for low-risk patients, but is
recommended for high-risk patients. This recommendation follows the
consensus statement: Guidelines for Management of Incidental
Pulmonary Nodules Detected on CT Images: From the [HOSPITAL]

## 2018-03-28 NOTE — Op Note (Signed)
NAME: Bryan Gallegos, Bryan Gallegos MEDICAL RECORD [id number] ACCOUNT [id number] DATE OF BIRTH:12-09-1975 FACILITY: [facility] LOCATION: [facility] PHYSICIAN:Dhruvi Crenshaw C. Urania Pearlman, MD  OPERATIVE REPORT  DATE OF PROCEDURE:  03/16/2018  PREOPERATIVE DIAGNOSIS:  Multiple abscesses, right hand, a total of 3 on the dorsum, 1 on the volar aspect.  POSTOPERATIVE DIAGNOSIS:  Multiple abscesses, right hand, a total of 3 on the dorsum, 1 on the volar aspect.  PROCEDURE:  Incision and drainage of abscesses 3 on the dorsum, 1 on the volar ulnar wrist.  SURGEON:  Novak Stgermaine C. Izora Ribas, MD  ANESTHESIA:  Local fluid from the abscess and sent for culture.  FINDINGS:  Purulent drainage from all, more so on the volar wound.  DISPOSITION:  The patient tolerated the procedure well.  INDICATIONS:  The patient is a 43 year old gentleman who has a history of abscesses.  He comes into the hospital with swelling of his hand and several abscesses on the dorsal and one on the volar aspect of his right wrist.  He has been on antibiotics, is  some better; however, primary care physician feels that a surgical consult is needed.  On evaluation, I felt that I and D was appropriate.  Risks, benefits and alternatives were discussed with the patient.  He agreed.  We will proceed with I and D.  DESCRIPTION OF PROCEDURE:  The right hand was prepped and draped in normal sterile fashion.  Local anesthetic was infiltrated around each of the small abscesses, 3 on the dorsum and 1 on the volar aspect.  A small incision was made overlying the abscess  and the fluid was drained.  Squeezing of the surrounding areas was used to completely expressed all infected material and then each of the wounds on the dorsal aspect was thoroughly irrigated with normal saline solution.  On the volar aspect again  anesthetic was used to numb the skin.  An approximately 1 cm incision was made over the abscess.  Immediately, there was some purulent material that was  evacuated.  Gentle probing of the wound was performed to free up any loculated infected pockets.   This wound was again thoroughly irrigated with saline solution.  This wound was packed gently with a small piece of gauze.  Sterile dressing was applied.    The patient tolerated the procedure well.    Post-instructions include remove the packing in 24 hours and began irrigations 3 times a day.  Continue on antibiotics.  AN/NUANCE  D:03/27/2018 T:03/28/2018 JOB:[id number]

## 2018-03-31 ENCOUNTER — Telehealth: Payer: Self-pay | Admitting: Pharmacy Technician

## 2018-03-31 NOTE — Telephone Encounter (Signed)
[facility] Patient Advocate Encounter   Received notification from [facility] that prior authorization for Harvoni is required.   PA submitted on 03/30/2018 Key [id number] Status is pending    [facility] will continue to follow.  Netty Starring. [name] CPhT Specialty Pharmacy Patient [facility] for Infectious Disease Phone: [phone] Fax:  [phone]

## 2018-04-05 NOTE — Telephone Encounter (Signed)
HIV Patient Advocate Encounter  Prior Authorization for Bryan Gallegos has been approved.    PA# [id number] For 12 weeks  Patients co-pay is $3.90  Left several voicemails with Bryan Gallegos to call so that we can update and set up delivery.   [facility] will continue to follow.  Netty Starring. [name] CPhT Specialty Pharmacy Patient [facility] for Infectious Disease Phone: [phone] Fax:  [phone]

## 2018-04-10 ENCOUNTER — Telehealth: Payer: Self-pay | Admitting: Pharmacy Technician

## 2018-04-10 NOTE — Telephone Encounter (Signed)
[facility] Patient Advocate Encounter  I have been unsuccessful in reaching patient to be able to fill medication.  [name] is approved for Harvoni.   We have tried multiple times without a response or call back from the patient.  Netty Starring. [name] CPhT Specialty Pharmacy Patient [facility] for Infectious Disease Phone: [phone] Fax:  [phone]

## 2018-04-18 ENCOUNTER — Encounter: Payer: Self-pay | Admitting: Pharmacy Technician

## 2018-04-18 MED FILL — HARVONI 90-400 MG TABLET: 90-400 | 28 days supply | Qty: 28 | Fill #0

## 2018-04-18 NOTE — Telephone Encounter (Signed)
HEP C Patient Advocate Encounter  Prior Authorization for Bryan Gallegos has been approved.    PA# [id number] Effective dates: 03/31/2018 through for 12 weeks from start date.  Patients co-pay is $3.90.   [facility] will continue to follow.  Bryan Gallegos. [name] CPhT Specialty Pharmacy Patient [facility] for Infectious Disease Phone: [phone] Fax:  [phone]

## 2018-04-19 ENCOUNTER — Telehealth: Payer: Self-pay | Admitting: Pharmacist

## 2018-04-19 NOTE — Telephone Encounter (Signed)
Patient is approved to receive Harvoni x 12 weeks for chronic Hepatitis C infection. Counseled patient to take Harvoni daily with or without food. Encouraged patient not to miss any doses and explained how their chance of cure could go down with each dose missed.  Counseled patient on what to do if dose is missed - if it is closer to the missed dose take immediately; if closer to next dose then skip dose and take the next dose at the usual time.   Counseled patient on common side effects such as headache, fatigue, and nausea and that these normally decrease with time. I reviewed patient medications and found no interactions. Discussed with patient that there are several drug interactions including acid suppressants. Instructed patient to call clinic if he wishes to start a new medication during course of therapy. Also advised patient to call if he experiences side effects. Patient will follow-up with me in the pharmacy clinic on 3/16.

## 2018-04-19 NOTE — Telephone Encounter (Signed)
Thank you :)

## 2018-05-12 MED FILL — HARVONI 90-400 MG TABLET: 90-400 | 28 days supply | Qty: 28 | Fill #1

## 2018-05-15 ENCOUNTER — Ambulatory Visit: Payer: Medicare HMO | Admitting: Pharmacist

## 2018-06-07 IMAGING — DX DG CHEST 1V
2 series · 2 of 2 positions shown · non-contrast
Comparison: 06/19/2016

CLINICAL DATA: Altered mental status

EXAM:
CHEST 1 VIEW

[chest ap (1 of 2)]
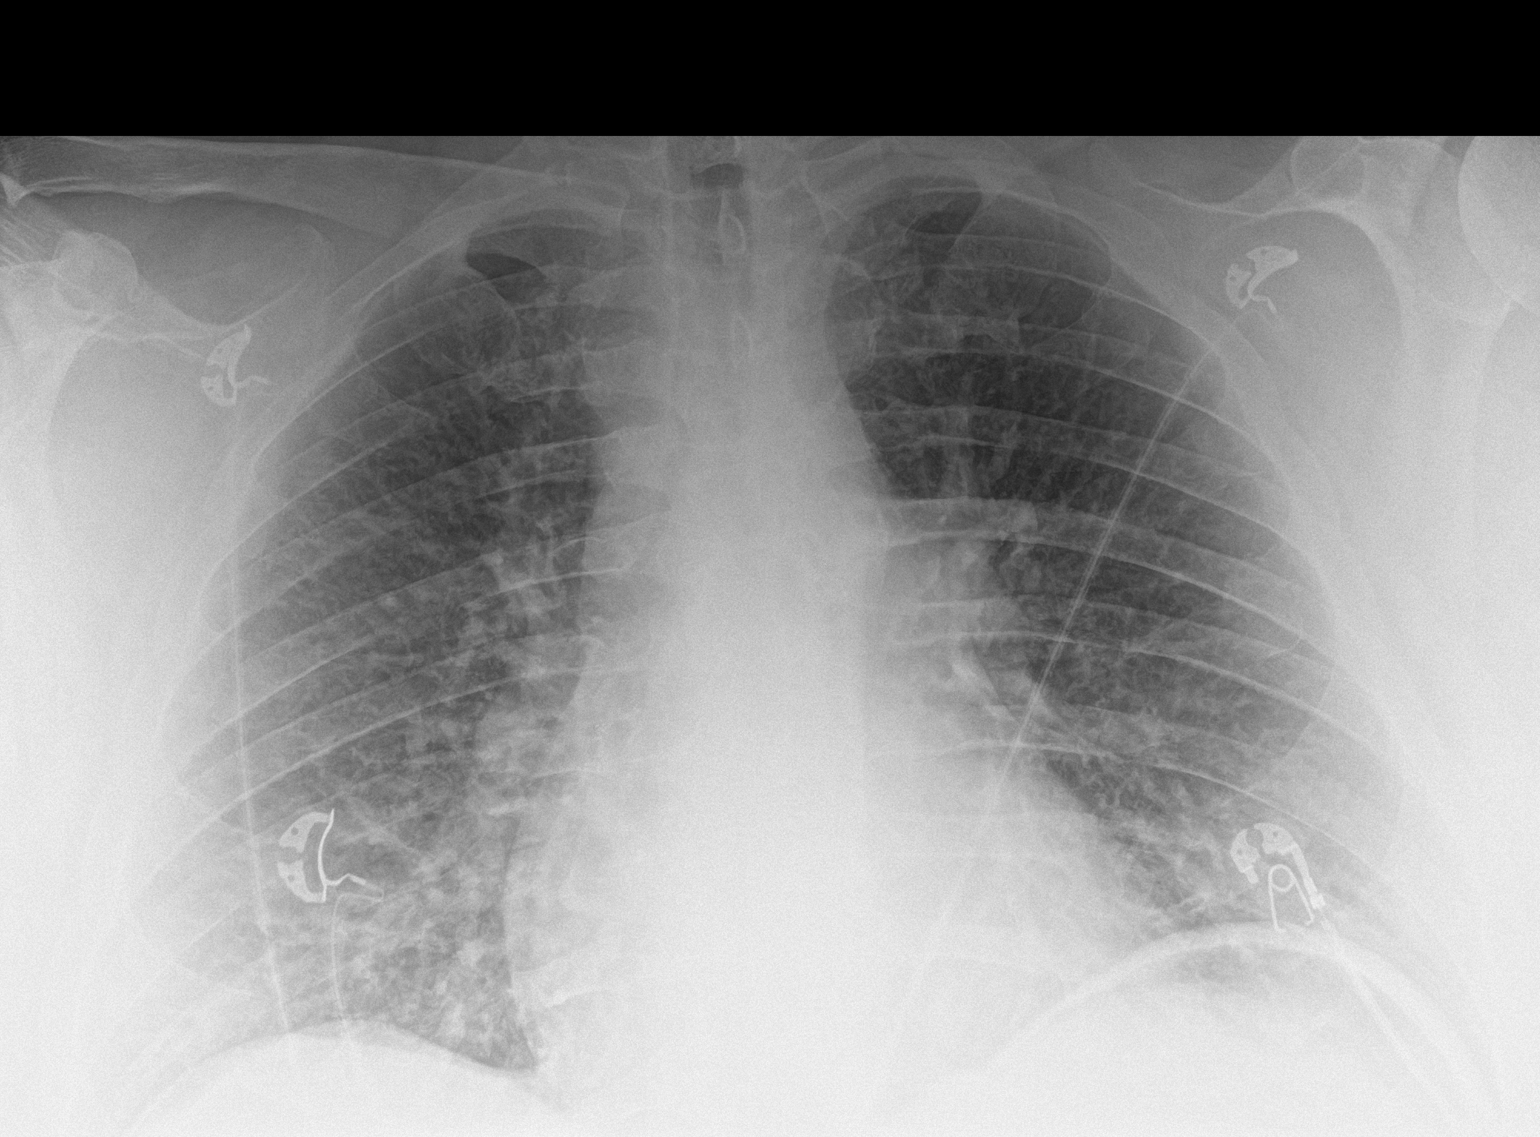

[chest ap (2 of 2)]
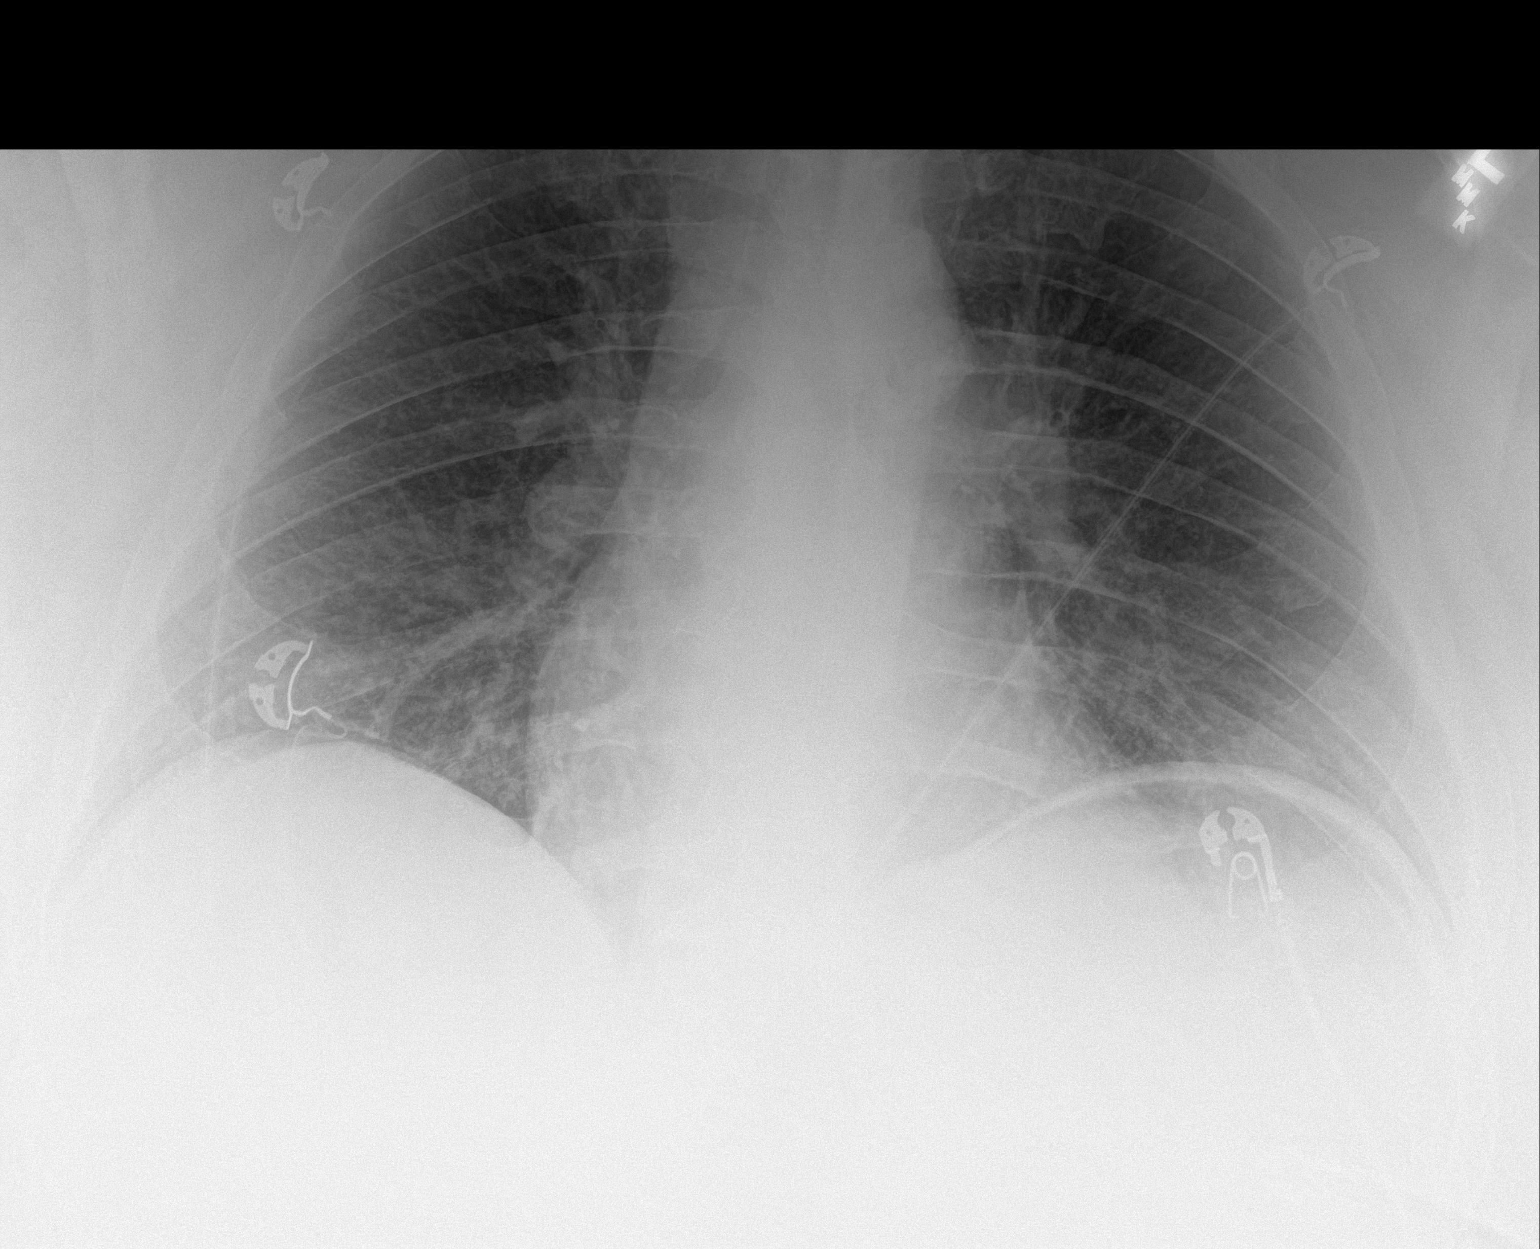

[2 of 2 positions shown; findings below may reference images not displayed]

FINDINGS: Borderline cardiomegaly. Mild central congestion. Perihilar
interstitial opacities, slightly increased. No pneumothorax.
IMPRESSION: Borderline heart size with mild central congestion. Mild diffuse
interstitial prominence could be secondary to mild edema

## 2018-06-07 MED FILL — HARVONI 90-400 MG TABLET: 90-400 | 28 days supply | Qty: 28 | Fill #2

## 2018-06-30 ENCOUNTER — Telehealth: Payer: Self-pay | Admitting: Pharmacist

## 2018-06-30 NOTE — Telephone Encounter (Signed)
Patient should be almost done with his 12 weeks of Harvoni for his Hepatitis C infection.  Called him today to see how he was doing. He missed his f/u appointment with me back in March.  No answer, left HIPAA complaint VM.

## 2018-07-24 ENCOUNTER — Emergency Department (HOSPITAL_COMMUNITY)
Admission: EM | Admit: 2018-07-24 | Discharge: 2018-07-25 | Disposition: A | Discharge status: Other Acute Inpatient Hospital | Payer: Medicare HMO | Attending: Emergency Medicine | Admitting: Emergency Medicine

## 2018-07-24 ENCOUNTER — Other Ambulatory Visit: Payer: Self-pay

## 2018-07-24 ENCOUNTER — Encounter (HOSPITAL_COMMUNITY): Payer: Self-pay

## 2018-07-24 DIAGNOSIS — F319 Bipolar disorder, unspecified: Secondary | ICD-10-CM | POA: Insufficient documentation

## 2018-07-24 DIAGNOSIS — E119 Type 2 diabetes mellitus without complications: Secondary | ICD-10-CM | POA: Insufficient documentation

## 2018-07-24 DIAGNOSIS — T50904A Poisoning by unspecified drugs, medicaments and biological substances, undetermined, initial encounter: Secondary | ICD-10-CM | POA: Diagnosis present

## 2018-07-24 DIAGNOSIS — Z79899 Other long term (current) drug therapy: Secondary | ICD-10-CM | POA: Insufficient documentation

## 2018-07-24 DIAGNOSIS — F17210 Nicotine dependence, cigarettes, uncomplicated: Secondary | ICD-10-CM | POA: Diagnosis not present

## 2018-07-24 DIAGNOSIS — Z7984 Long term (current) use of oral hypoglycemic drugs: Secondary | ICD-10-CM | POA: Diagnosis not present

## 2018-07-24 DIAGNOSIS — F1120 Opioid dependence, uncomplicated: Secondary | ICD-10-CM | POA: Insufficient documentation

## 2018-07-24 DIAGNOSIS — I10 Essential (primary) hypertension: Secondary | ICD-10-CM | POA: Diagnosis not present

## 2018-07-24 DIAGNOSIS — T50902A Poisoning by unspecified drugs, medicaments and biological substances, intentional self-harm, initial encounter: Secondary | ICD-10-CM | POA: Diagnosis not present

## 2018-07-24 DIAGNOSIS — Z1159 Encounter for screening for other viral diseases: Secondary | ICD-10-CM | POA: Diagnosis not present

## 2018-07-24 LAB — CBC WITH DIFFERENTIAL/PLATELET
Abs Immature Granulocytes: 0.03 10*3/uL (ref 0.00–0.07)
Basophils Absolute: 0 10*3/uL (ref 0.0–0.1)
Basophils Relative: 0 %
Eosinophils Absolute: 0.1 10*3/uL (ref 0.0–0.5)
Eosinophils Relative: 1 %
HCT: 45.1 % (ref 39.0–52.0)
Hemoglobin: 15 g/dL (ref 13.0–17.0)
Immature Granulocytes: 0 %
Lymphocytes Relative: 37 %
Lymphs Abs: 3.1 10*3/uL (ref 0.7–4.0)
MCH: 29.2 pg (ref 26.0–34.0)
MCHC: 33.3 g/dL (ref 30.0–36.0)
MCV: 87.9 fL (ref 80.0–100.0)
Monocytes Absolute: 0.6 10*3/uL (ref 0.1–1.0)
Monocytes Relative: 7 %
Neutro Abs: 4.5 10*3/uL (ref 1.7–7.7)
Neutrophils Relative %: 55 %
Platelets: 285 10*3/uL (ref 150–400)
RBC: 5.13 MIL/uL (ref 4.22–5.81)
RDW: 12.9 % (ref 11.5–15.5)
WBC: 8.3 10*3/uL (ref 4.0–10.5)
nRBC: 0 % (ref 0.0–0.2)

## 2018-07-24 LAB — COMPREHENSIVE METABOLIC PANEL
ALT: 23 U/L (ref 0–44)
AST: 20 U/L (ref 15–41)
Albumin: 3.4 g/dL — ABNORMAL LOW (ref 3.5–5.0)
Alkaline Phosphatase: 67 U/L (ref 38–126)
Anion gap: 13 (ref 5–15)
BUN: 10 mg/dL (ref 6–20)
CO2: 24 mmol/L (ref 22–32)
Calcium: 9 mg/dL (ref 8.9–10.3)
Chloride: 97 mmol/L — ABNORMAL LOW (ref 98–111)
Creatinine, Ser: 0.87 mg/dL (ref 0.61–1.24)
GFR calc Af Amer: >60 mL/min (ref >60)
GFR calc non Af Amer: >60 mL/min (ref >60)
Glucose, Bld: 254 mg/dL — ABNORMAL HIGH (ref 70–99)
Potassium: 4.4 mmol/L (ref 3.5–5.1)
Sodium: 134 mmol/L — ABNORMAL LOW (ref 135–145)
Total Bilirubin: 0.9 mg/dL (ref 0.3–1.2)
Total Protein: 7.9 g/dL (ref 6.5–8.1)

## 2018-07-24 LAB — RAPID URINE DRUG SCREEN, HOSP PERFORMED
Amphetamines: POSITIVE — AB
Barbiturates: NOT DETECTED
Benzodiazepines: POSITIVE — AB
Cocaine: NOT DETECTED
Opiates: NOT DETECTED
Tetrahydrocannabinol: NOT DETECTED

## 2018-07-24 LAB — LACTIC ACID, PLASMA: Lactic Acid, Venous: 1.5 mmol/L (ref 0.5–1.9)

## 2018-07-24 LAB — ETHANOL: Alcohol, Ethyl (B): <10 mg/dL (ref <10)

## 2018-07-24 LAB — ACETAMINOPHEN LEVEL: Acetaminophen (Tylenol), Serum: <10 ug/mL — ABNORMAL LOW (ref 10–30)

## 2018-07-24 LAB — SALICYLATE LEVEL: Salicylate Lvl: <7 mg/dL (ref 2.8–30.0)

## 2018-07-24 MED ORDER — ATORVASTATIN CALCIUM 10 MG PO TABS
20.0000 mg | ORAL_TABLET | Freq: Every day | ORAL | Status: DC
  Administered 2018-07-24: 23:00:00 20 mg via ORAL
  Filled 2018-07-24: qty 2

## 2018-07-24 MED ORDER — OXYCODONE HCL 5 MG PO TABS
15.0000 mg | ORAL_TABLET | Freq: Four times a day (QID) | ORAL | Status: DC | PRN
  Administered 2018-07-24 – 2018-07-25 (×3): 15 mg via ORAL
  Filled 2018-07-24 (×3): qty 3

## 2018-07-24 MED ORDER — METFORMIN HCL 500 MG PO TABS
1000.0000 mg | ORAL_TABLET | Freq: Two times a day (BID) | ORAL | Status: DC
  Administered 2018-07-25: 13:00:00 1000 mg via ORAL
  Filled 2018-07-24: qty 2

## 2018-07-24 MED ORDER — GABAPENTIN 400 MG PO CAPS
800.0000 mg | ORAL_CAPSULE | Freq: Three times a day (TID) | ORAL | Status: DC
  Administered 2018-07-24 – 2018-07-25 (×2): 800 mg via ORAL
  Filled 2018-07-24 (×2): qty 2

## 2018-07-24 MED ORDER — LINAGLIPTIN 5 MG PO TABS
5.0000 mg | ORAL_TABLET | Freq: Every day | ORAL | Status: DC
  Filled 2018-07-24: qty 1

## 2018-07-24 MED ORDER — CLONAZEPAM 0.5 MG PO TABS
1.0000 mg | ORAL_TABLET | Freq: Three times a day (TID) | ORAL | Status: DC | PRN
  Administered 2018-07-24 – 2018-07-25 (×3): 1 mg via ORAL
  Filled 2018-07-24 (×3): qty 2

## 2018-07-24 NOTE — ED Provider Notes (Signed)
[facility] EMERGENCY DEPARTMENT Provider Note   CSN: [id number] Arrival date & time: 07/24/18  1652    History   Chief Complaint Chief Complaint  Patient presents with  . Drug Overdose    HPI Bryan Gallegos is a 43 y.o. male. With a history of anxiety and depression who presents to the ED after a Trazodone overdose. Per EMS patient endorsed taking 15 trazodone pills on purpose. He then told triage nurse it was an accident. On my exam he initially states he was trying to take aspirin but someone switched the bottles. When I asked him why he took so many aspirin he changes his story and states he was trying to take his other medications that he puts in a separate bottle but "someone switched the bottle".  Family has been concerned that patient has been increasingly depressed. States they found him crying today after visiting his wife's grave. Patient endorses crying. He states he cries about his wife every now and then. He denies that this was a suicide attempt.     HPI  Past Medical History:  Diagnosis Date  . Adult ADHD   . Anaphylactic reaction   . Anxiety   . Asthma   . Bipolar disorder (HCC)   . Complication of anesthesia    Per patient difficult intubation;  . Diabetes mellitus   . Difficult intubation    Per patient  . Drug overdose   . Heart valve disorder    s/p echocardiogram  . Hyperlipidemia   . Hypertension   . Morbidly obese (HCC)   . OSA (obstructive sleep apnea)   . Polysubstance abuse (HCC)   . Transient cerebral ischemia    Unknown    Patient Active Problem List   Diagnosis Date Noted  . Cellulitis of hand 03/16/2018  . Cellulitis and abscess of hand 03/15/2018  . 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 Swelling of right hand 06/27/2017  . Bipolar affective disorder (HCC)   . Vertebral osteomyelitis (HCC) 05/01/2017  . 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 Opioid use disorder (HCC)   .  Hepatitis C infection   . History of suicide attempt 10/17/2016  . Chronic pain syndrome 10/22/2015  . Polysubstance dependence including opioid type drug, episodic abuse (HCC) 09/04/2015  . Cholelithiasis 05/17/2015  . Antisocial personality disorder (HCC) 04/11/2015  . Benzodiazepine dependence (HCC) 04/11/2015  . Tobacco use disorder 04/10/2015  . Gastroesophageal reflux disease with esophagitis 01/28/2014  . Type 2 diabetes mellitus with hemoglobin A1c goal of less than 7.0% (HCC) 01/28/2014  . Morbid obesity (HCC) 01/28/2014  . TIA (transient ischemic attack) 09/02/2013  . HLD (hyperlipidemia) 08/06/2013  . OSA on CPAP 08/06/2013    Past Surgical History:  Procedure Laterality Date  . CARPAL TUNNEL RELEASE    . ESOPHAGOGASTRODUODENOSCOPY N/A 04/05/2015   Procedure: ESOPHAGOGASTRODUODENOSCOPY (EGD);  Surgeon: Malissa Hippo, MD;  Location: [facility];  Service: Endoscopy;  Laterality: N/A;  . ESOPHAGOGASTRODUODENOSCOPY (EGD) WITH PROPOFOL N/A 05/21/2015   Procedure: ESOPHAGOGASTRODUODENOSCOPY (EGD) WITH PROPOFOL;  Surgeon: Meryl Dare, MD;  Location: [facility] ENDOSCOPY;  Service: Endoscopy;  Laterality: N/A;  . NOSE SURGERY    . TOOTH EXTRACTION    . WISDOM TOOTH EXTRACTION          Home Medications    Prior to Admission medications   Medication Sig Start Date End Date Taking? Authorizing Provider  albuterol (PROVENTIL HFA;VENTOLIN HFA) 108 (90 Base) MCG/ACT inhaler Inhale 1-2 puffs  into the lungs every 6 (six) hours as needed for wheezing or shortness of breath. 02/10/17  Yes Armandina Stammer I, NP  amphetamine-dextroamphetamine (ADDERALL) 30 MG tablet Take 60 mg by mouth daily.   Yes [provider]  atorvastatin (LIPITOR) 20 MG tablet Take 1 tablet (20 mg total) by mouth at bedtime. For high cholesterol Patient taking differently: Take 20 mg by mouth daily. For high cholesterol 02/10/17  Yes Nwoko, Nicole Kindred I, NP  clonazePAM (KLONOPIN) 1 MG tablet Take 1 mg by mouth.   05/28/17  Yes [provider]  Fluticasone-Salmeterol (ADVAIR) 100-50 MCG/DOSE AEPB Inhale 2 puffs into the lungs 2 (two) times daily as needed (shortness of breath).    Yes [provider]  gabapentin (NEURONTIN) 800 MG tablet Take 800 mg by mouth 3 (three) times daily. 04/25/18  Yes [provider]  hydrOXYzine (VISTARIL) 50 MG capsule Take 50 mg by mouth See admin instructions. Take one tablet (50 mg) by mouth twice daily, may take a 3rd table (50 mg) at bedtime as needed for itching. 05/03/18  Yes [provider]  metFORMIN (GLUCOPHAGE) 1000 MG tablet Take 1 tablet (1,000 mg total) by mouth 2 (two) times daily with a meal. 03/18/18  Yes Barnetta Chapel, MD  omeprazole (PRILOSEC) 40 MG capsule Take 1 capsule (40 mg total) by mouth 2 (two) times daily. For acid reflux Patient taking differently: Take 40 mg by mouth 2 (two) times daily as needed (acid reflux).  02/10/17  Yes Armandina Stammer I, NP  oxyCODONE (ROXICODONE) 15 MG immediate release tablet Take 15 mg by mouth every 6 (six) hours.  06/22/17  Yes [provider]  PRESCRIPTION MEDICATION Pt states he is on four different eye drops - last dose 2pm 5/25   Yes [provider]  sitaGLIPtin (JANUVIA) 100 MG tablet Take 100 mg by mouth at bedtime.   Yes [provider]  tiZANidine (ZANAFLEX) 4 MG tablet Take 4 mg by mouth 3 (three) times daily. 05/12/18  Yes [provider]  traZODone (DESYREL) 150 MG tablet Take 15,000 mg by mouth at bedtime. 05/03/18  Yes [provider]  Carboxymethylcellulose Sodium (EYE DROPS OP) Apply 2 drops to eye every 3 (three) hours as needed (dry eyes).    [provider]  cloNIDine (CATAPRES) 0.2 MG tablet Take 0.2 mg by mouth 2 (two) times daily. 12/06/17   [provider]  gabapentin (NEURONTIN) 400 MG capsule Take 2 capsules (800 mg total) by mouth 3 (three) times daily. For agitation/Neuropathy Patient not taking: Reported on  07/24/2018 02/10/17   Armandina Stammer I, NP  hydrochlorothiazide (HYDRODIURIL) 25 MG tablet Take 25 mg by mouth daily. 12/06/17   [provider]  Ledipasvir-Sofosbuvir (HARVONI) 90-400 MG TABS Take 1 tablet by mouth daily. 01/27/18   Kuppelweiser, Cassie L, RPH-CPP  Oxcarbazepine (TRILEPTAL) 300 MG tablet Take 300 mg by mouth daily. 12/14/17   [provider]  sitaGLIPtin (JANUVIA) 50 MG tablet Take 1 tablet (50 mg total) by mouth daily with breakfast. Patient not taking: Reported on 07/24/2018 03/19/18   Berton Mount I, MD  ziprasidone (GEODON) 80 MG capsule Take 80 mg by mouth 2 (two) times daily with a meal.  05/28/17   [provider]  lisinopril (PRINIVIL,ZESTRIL) 40 MG tablet Take 1 tablet (40 mg total) by mouth 2 (two) times daily. For high blood pressure 09/05/15 10/01/15  Sanjuana Kava, NP    Family History Family History  Problem Relation Age of Onset  .  CAD Mother        Living  . Diabetes Mellitus II Mother   . Stroke Mother   . Hypertension Mother   . Congestive Heart Failure Mother   . Kidney disease Mother   . Fibromyalgia Mother   . Thyroid disease Mother   . Hyperlipidemia Mother   . Liver disease Mother   . Alcoholism Father 51       Deceased  . Arthritis Maternal Grandmother   . Congestive Heart Failure Maternal Grandmother   . Hypertension Maternal Grandmother   . Lung cancer Maternal Grandfather   . Colon cancer Maternal Aunt   . Stomach cancer Maternal Aunt   . Heart disease Other        Paternal & Maternal  . Crohn's disease Other   . Hypertension Other        Paternal & Maternal  . Hypertension Brother        x3  . Hypertension Sister        #1  . Bipolar disorder Sister        #1  . ADD / ADHD Son        x3  . Bipolar disorder Son        x3  . Asperger's syndrome Son     Social History Social History   Tobacco Use  . Smoking status: Current Every Day Smoker    Packs/day: 0.50    Years: 23.00    Pack years: 11.50     Types: Cigarettes  . Smokeless tobacco: Never Used  Substance Use Topics  . Alcohol use: No    Alcohol/week: 0.0 standard drinks  . Drug use: Not Currently    Types: Benzodiazepines, Heroin     Allergies   Ace inhibitors; Atenolol; Lisinopril; Prednisone; Ibuprofen; Rexulti [brexpiprazole]; and Tylenol [acetaminophen]   Review of Systems Review of Systems  Constitutional: Positive for activity change. Negative for chills and fever.  HENT: Negative for ear pain.   Eyes: Negative for pain.  Respiratory: Negative for cough and shortness of breath.   Cardiovascular: Negative for chest pain and palpitations.  Gastrointestinal: Negative for abdominal pain, diarrhea, nausea and vomiting.  Genitourinary: Negative for dysuria.  Musculoskeletal: Positive for back pain (chronic).  Skin: Negative for color change and rash.  Neurological: Negative for seizures and syncope.  Psychiatric/Behavioral: Positive for decreased concentration, dysphoric mood and sleep disturbance.  All other systems reviewed and are negative.    Physical Exam Updated Vital Signs BP (!) 96/53 (BP Location: Right Arm)   Pulse 62   Temp 97.7 F (36.5 C) (Oral)   Resp 18   Ht 6\' 2"  (1.88 m)   Wt (!) 158.8 kg   SpO2 92%   BMI 44.94 kg/m   Physical Exam Vitals signs and nursing note reviewed.  Constitutional:      General: He is not in acute distress.    Appearance: He is well-developed. He is obese. He is not ill-appearing.  HENT:     Head: Normocephalic and atraumatic.  Eyes:     Conjunctiva/sclera: Conjunctivae normal.  Neck:     Musculoskeletal: Neck supple.  Cardiovascular:     Rate and Rhythm: Normal rate and regular rhythm.     Heart sounds: No murmur.  Pulmonary:     Effort: Pulmonary effort is normal. No respiratory distress.     Breath sounds: Normal breath sounds. No wheezing.  Abdominal:     Palpations: Abdomen is soft.     Tenderness: There  is no abdominal tenderness.   Musculoskeletal:     Right lower leg: No edema.     Left lower leg: No edema.  Skin:    General: Skin is warm and dry.  Neurological:     Mental Status: He is alert and oriented to person, place, and time.     GCS: GCS eye subscore is 3. GCS verbal subscore is 5. GCS motor subscore is 6.     Cranial Nerves: Cranial nerves are intact. No cranial nerve deficit.     Sensory: Sensation is intact.     Motor: Motor function is intact.     Coordination: Coordination is intact.     Gait: Gait is intact.     Comments: On initial exam patient is sleepy but easily arouses to voice and answers all questions appropriately.   Non-focal neuro exam. No Myoclonus or abnormal reflexes      ED Treatments / Results  Labs (all labs ordered are listed, but only abnormal results are displayed) Labs Reviewed  COMPREHENSIVE METABOLIC PANEL - Abnormal; Notable for the following components:      Result Value   Sodium 134 (*)    Chloride 97 (*)    Glucose, Bld 254 (*)    Albumin 3.4 (*)    All other components within normal limits  ACETAMINOPHEN LEVEL - Abnormal; Notable for the following components:   Acetaminophen (Tylenol), Serum <10 (*)    All other components within normal limits  RAPID URINE DRUG SCREEN, HOSP PERFORMED - Abnormal; Notable for the following components:   Benzodiazepines POSITIVE (*)    Amphetamines POSITIVE (*)    All other components within normal limits  SARS CORONAVIRUS 2 (HOSPITAL ORDER, PERFORMED IN [facility])  CBC WITH DIFFERENTIAL/PLATELET  LACTIC ACID, PLASMA  SALICYLATE LEVEL  ETHANOL    EKG EKG Interpretation  Date/Time:  Monday Jul 24 2018 17:00:57 EDT Ventricular Rate:  63 PR Interval:    QRS Duration: 90 QT Interval:  498 QTC Calculation: 510 R Axis:   94 Text Interpretation:  Sinus rhythm Borderline right axis deviation ST elev, probable normal early repol pattern Prolonged QT interval Confirmed by Pricilla Loveless [phone]) on 07/24/2018  5:17:48 PM   Radiology No results found.  Procedures Procedures (including critical care time)  Medications Ordered in ED Medications  clonazePAM (KLONOPIN) tablet 1 mg (1 mg Oral Given 07/25/18 1238)  oxyCODONE (Oxy IR/ROXICODONE) immediate release tablet 15 mg (15 mg Oral Given 07/25/18 1237)  metFORMIN (GLUCOPHAGE) tablet 1,000 mg (1,000 mg Oral Given 07/25/18 1235)  atorvastatin (LIPITOR) tablet 20 mg (20 mg Oral Given 07/24/18 2246)  gabapentin (NEURONTIN) capsule 800 mg (800 mg Oral Given 07/25/18 1235)  linagliptin (TRADJENTA) tablet 5 mg (5 mg Oral Not Given 07/25/18 1240)     Initial Impression / Assessment and Plan / ED Course  I have reviewed the triage vital signs and the nursing notes.  Pertinent labs & imaging results that were available during my care of the patient were reviewed by me and considered in my medical decision making (see chart for details).        Bryan Gallegos is a 43 y.o. male. With a history of anxiety and depression who presents to the ED after a Trazodone overdose.  On initial exam patient appears sleepy, not in acute distress. VSS. Physical exam as above. He is protecting his airway.   EKG shows normal intervals without evidence of ischemia or conduction abnormalities.   Will obtain labs  including co-ingestions. Glucose elevated at 254. Otherwise labs unremarkable without emergent findings. Ethanol, salicylates, acetaminophen negative.  Patient observed in ED and allowed to metabolize the trazodone. Given his changing story, unable to determine intent. His family is concerns it was a suicide attempt. Psychiatry consulted. They are recommending patient be IVC'd and placed in inpatient psychiatry.    IVC paperwork filled out. Discussed this with patient who is upset and states he will stay "as long as they give me my medications". Advised him I would order his prescribed medications.  Patient medically cleared at this time and placed in  psychiatry obs awaiting admission.  Final Clinical Impressions(s) / ED Diagnoses   Final diagnoses:  Intentional drug overdose, initial encounter [facility](HCC)    ED Discharge Orders    None       Dicky DoeFord, Muna Demers, MD 07/25/18 1303    Pricilla LovelessGoldston, Scott, MD 07/26/18 [phone]

## 2018-07-24 NOTE — ED Notes (Signed)
Pt requesting urinal and same given. Alert and responds to questions.
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
## 2018-07-24 NOTE — ED Notes (Signed)
Pt unable to fit into purple scrubs. Pt given 2 XL gowns for coverage. Pt corporative at the time. Pharmacy tech at bedside.

## 2018-07-24 NOTE — ED Notes (Signed)
[place] IV attem,pted x 2 by Dr. Ala Dach. Labs drawn but  No IV establioshed.

## 2018-07-24 NOTE — ED Notes (Signed)
Pt belongings placed in locker #3  [employer]

## 2018-07-24 NOTE — ED Notes (Signed)
Iv attempted withput success. 
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
## 2018-07-24 NOTE — ED Notes (Signed)
Patient has called out multiple times for multiple things in the past few minutes, " I want to leave, I need some water, the IV is hurting my arm, and I am going to leave". Patient provided with water and MD notified that pt wanting to leave.

## 2018-07-24 NOTE — ED Notes (Signed)
Per [facility] Assessment; pt meets inpatient criteria and should be IVC'd so that pt cannot leave.

## 2018-07-24 NOTE — ED Notes (Signed)
Dr. Ala Dach at bedside attempting [place] IV.

## 2018-07-24 NOTE — ED Notes (Signed)
Patient has removed his IV and all monitoring equipment.

## 2018-07-25 ENCOUNTER — Inpatient Hospital Stay (HOSPITAL_COMMUNITY)
Admission: AD | Admit: 2018-07-25 | Discharge: 2018-07-26 | DRG: 885 | Disposition: A | Discharge status: Home / Self Care | Payer: Medicare HMO | Attending: Psychiatry | Admitting: Psychiatry

## 2018-07-25 ENCOUNTER — Other Ambulatory Visit: Payer: Self-pay | Admitting: Behavioral Health

## 2018-07-25 ENCOUNTER — Encounter (HOSPITAL_COMMUNITY): Payer: Self-pay

## 2018-07-25 DIAGNOSIS — F419 Anxiety disorder, unspecified: Secondary | ICD-10-CM | POA: Diagnosis present

## 2018-07-25 DIAGNOSIS — E119 Type 2 diabetes mellitus without complications: Secondary | ICD-10-CM | POA: Diagnosis present

## 2018-07-25 DIAGNOSIS — G8929 Other chronic pain: Secondary | ICD-10-CM | POA: Diagnosis present

## 2018-07-25 DIAGNOSIS — G4700 Insomnia, unspecified: Secondary | ICD-10-CM | POA: Diagnosis present

## 2018-07-25 DIAGNOSIS — F17210 Nicotine dependence, cigarettes, uncomplicated: Secondary | ICD-10-CM | POA: Diagnosis present

## 2018-07-25 DIAGNOSIS — T50904A Poisoning by unspecified drugs, medicaments and biological substances, undetermined, initial encounter: Secondary | ICD-10-CM | POA: Diagnosis present

## 2018-07-25 DIAGNOSIS — Z8673 Personal history of transient ischemic attack (TIA), and cerebral infarction without residual deficits: Secondary | ICD-10-CM | POA: Diagnosis not present

## 2018-07-25 DIAGNOSIS — F319 Bipolar disorder, unspecified: Secondary | ICD-10-CM | POA: Diagnosis present

## 2018-07-25 DIAGNOSIS — K219 Gastro-esophageal reflux disease without esophagitis: Secondary | ICD-10-CM | POA: Diagnosis present

## 2018-07-25 DIAGNOSIS — I10 Essential (primary) hypertension: Secondary | ICD-10-CM | POA: Diagnosis present

## 2018-07-25 DIAGNOSIS — Z1159 Encounter for screening for other viral diseases: Secondary | ICD-10-CM

## 2018-07-25 DIAGNOSIS — T50902A Poisoning by unspecified drugs, medicaments and biological substances, intentional self-harm, initial encounter: Secondary | ICD-10-CM | POA: Diagnosis not present

## 2018-07-25 LAB — SARS CORONAVIRUS 2 BY RT PCR (HOSPITAL ORDER, PERFORMED IN ~~LOC~~ HOSPITAL LAB): SARS Coronavirus 2: NEGATIVE

## 2018-07-25 MED ORDER — OXYCODONE HCL 5 MG PO TABS
5.0000 mg | ORAL_TABLET | Freq: Four times a day (QID) | ORAL | Status: DC | PRN
  Administered 2018-07-25: 5 mg via ORAL
  Filled 2018-07-25: qty 1

## 2018-07-25 MED ORDER — METFORMIN HCL 500 MG PO TABS
1000.0000 mg | ORAL_TABLET | Freq: Two times a day (BID) | ORAL | Status: DC
  Administered 2018-07-26: 08:00:00 1000 mg via ORAL
  Filled 2018-07-25 (×4): qty 2

## 2018-07-25 MED ORDER — OXYCODONE HCL 5 MG PO TABS
5.0000 mg | ORAL_TABLET | Freq: Three times a day (TID) | ORAL | Status: DC | PRN
  Administered 2018-07-26: 5 mg via ORAL
  Filled 2018-07-25: qty 1

## 2018-07-25 MED ORDER — ALUM & MAG HYDROXIDE-SIMETH 200-200-20 MG/5ML PO SUSP: 30.0000 mL | ORAL | Status: DC | PRN

## 2018-07-25 MED ORDER — CLONAZEPAM 1 MG PO TABS
1.0000 mg | ORAL_TABLET | Freq: Once | ORAL | Status: AC
  Administered 2018-07-25: 20:00:00 1 mg via ORAL
  Filled 2018-07-25: qty 1

## 2018-07-25 MED ORDER — GABAPENTIN 300 MG PO CAPS
300.0000 mg | ORAL_CAPSULE | Freq: Three times a day (TID) | ORAL | Status: DC
  Administered 2018-07-25 – 2018-07-26 (×2): 300 mg via ORAL
  Filled 2018-07-25 (×7): qty 1

## 2018-07-25 MED ORDER — TEMAZEPAM 30 MG PO CAPS: 30.0000 mg | ORAL_CAPSULE | Freq: Once | ORAL | Status: DC

## 2018-07-25 MED ORDER — OXYCODONE HCL 5 MG PO TABS: 10.0000 mg | ORAL_TABLET | Freq: Once | ORAL | Status: DC

## 2018-07-25 MED ORDER — CLONAZEPAM 0.5 MG PO TABS: 0.5000 mg | ORAL_TABLET | Freq: Two times a day (BID) | ORAL | Status: DC | PRN

## 2018-07-25 MED ORDER — CLONAZEPAM 0.5 MG PO TABS
0.5000 mg | ORAL_TABLET | Freq: Three times a day (TID) | ORAL | Status: DC | PRN
  Filled 2018-07-25: qty 1

## 2018-07-25 MED ORDER — CLONIDINE HCL 0.2 MG PO TABS
0.2000 mg | ORAL_TABLET | Freq: Two times a day (BID) | ORAL | Status: DC
  Administered 2018-07-25: 0.2 mg via ORAL
  Filled 2018-07-25 (×2): qty 1
  Filled 2018-07-25: qty 2
  Filled 2018-07-25 (×2): qty 1

## 2018-07-25 NOTE — Progress Notes (Addendum)
Pt accepted to  [facility], Bed 402-1 Celesta Gentile is the accepting provider.  Nehemiah Massed, MD is the attending provider.  Call report to [phone]  [facility] @ [facility] ED notified.   Pt is IVC'd  Pt may be transported by [facility] Pt scheduled  to arrive at [facility] at, or after 14:00  Carney Bern T. Kaylyn Lim, MSW, LCSW Disposition Clinical Social Work [phone] (cell) [phone] (office)

## 2018-07-25 NOTE — ED Notes (Signed)
Original IVC Paperwork faxed to [facility] and  placed in red folder. Copy sent to medical records. 3 Copies attached to pt's clipboard.

## 2018-07-25 NOTE — Tx Team (Signed)
Initial Treatment Plan 07/25/2018 3:49 PM Jeffri Canela UKG:[id number]    PATIENT STRESSORS: Health problems Marital or family conflict Substance abuse   PATIENT STRENGTHS: Ability for insight Communication skills Supportive family/friends   PATIENT IDENTIFIED PROBLEMS: "I don't have any. I'm not suppose to be here"  Suicidal acts  Grieving related to the passing of his wife                 DISCHARGE CRITERIA:  Ability to meet basic life and health needs Improved stabilization in mood, thinking, and/or behavior Medical problems require only outpatient monitoring  PRELIMINARY DISCHARGE PLAN: Attend PHP/IOP Outpatient therapy Participate in family therapy Return to previous living arrangement  PATIENT/FAMILY INVOLVEMENT: This treatment plan has been presented to and reviewed with the patient, Varner Cancilla.  The patient and family have been given the opportunity to ask questions and make suggestions.  Raylene Miyamoto, RN 07/25/2018, 3:49 PM

## 2018-07-25 NOTE — ED Notes (Signed)
Breakfast ordered 
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
## 2018-07-25 NOTE — ED Notes (Signed)
Pt refused to have temp taken. Security talking w/pt as pt continues to be upset w/acceptance to [facility]. Denies SI or SI attempt.

## 2018-07-25 NOTE — ED Notes (Signed)
Woke pt so may advise him of tx plan - accepted to [facility]. States he is OK w/plan but denies SI attempt. States "I accidentally took 7 or 8 extra Trazodone". Pt requesting "Oxy and my Klonopin". Note: Pt has been sleeping since this RN's arrival until was awakened by RN.

## 2018-07-25 NOTE — ED Notes (Signed)
ALL Belongings - 1 labeled belongings bag - [employer].

## 2018-07-25 NOTE — Progress Notes (Signed)
D: Pt irritable about wanting to move to [address] , pt stated he has a lot of triggers about being on [address] related to dying family member.  A: Pt was offered support and encouragement. Pt was given scheduled medications. Pt was encourage to attend groups. Q 15 minute checks were done for safety.  R: safety maintained on unit.

## 2018-07-25 NOTE — Progress Notes (Signed)
Patient is seated by the telephone in the hallway and is hitting his head against the wall. He is agitated and breathing rapidly. He is threatening to leave the hospital and has expressed that he will take matters into his own hands.

## 2018-07-25 NOTE — Progress Notes (Signed)
The patient attended the evening Wrap-Up group but stated that he didn't have anything positive to share about his day. He did not share a goal either.
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
## 2018-07-26 DIAGNOSIS — T50904A Poisoning by unspecified drugs, medicaments and biological substances, undetermined, initial encounter: Secondary | ICD-10-CM

## 2018-07-26 DIAGNOSIS — F319 Bipolar disorder, unspecified: Principal | ICD-10-CM

## 2018-07-26 NOTE — BHH Group Notes (Signed)
Occupational Therapy Group Note  Date:  07/26/2018 Time:  11:08 AM  Group Topic/Focus:  Self Esteem Action Plan:   The focus of this group is to help patients create a plan to continue to build self-esteem after discharge.  Participation Level:  Active  Participation Quality:  Intrusive and Resistant  Affect:  Blunted  Cognitive:  Alert  Insight: Lacking  Engagement in Group:  Developing/Improving and Resistant  Modes of Intervention:  Activity, Discussion, Education and Socialization  Additional Comments:    S: "I have great self esteem"  O: OT tx with focus on self esteem building this date. Education given on definition of self esteem, with both causes of low and high self esteem identified. Activity given for pt to identify a positive/aspiring trait for each letter of the alphabet. Pt to work with peers to help complete activity and build positive thinking.   A: Pt presents with blunted affect, intrusive and resistant this date. Pt asking OT many personal questions and saying "well you want to know all of this about [place] why can't we know this about you". Pt needing consistent redirection to stay on task. He did not offer any constructive information to the self esteem discussion, and did not complete A-Z reporting poor vision without his glasses.  P: OT group will be x1 per week while pt inpatient.  Dalphine Handing, MSOT, OTR/L Behavioral Health OT/ Acute Relief OT PHP Office: [phone]  Dalphine Handing 07/26/2018, 11:08 AM

## 2018-07-26 NOTE — [facility] Suicide Risk Assessment (Signed)
[facility] Discharge Suicide Risk Assessment   Principal Problem: Polysubstance dependencies Discharge Diagnoses: Active Problems:   Bipolar 1 disorder (HCC)   Total Time spent with patient: 45 minutes  Musculoskeletal: Strength & Muscle Tone: within normal limits Gait & Station: normal Patient leans: N/A  Psychiatric Specialty Exam: ROS  Blood pressure 108/63, pulse (!) 56, temperature 99.3 F (37.4 C), temperature source Oral, resp. rate 18, height 6\' 2"  (1.88 m), weight (!) 160.6 kg, SpO2 96 %.Body mass index is 45.45 kg/m.  General Appearance: Casual  Eye Contact::  Good  Speech:  Clear and Coherent409  Volume:  Normal  Mood:  Irritable  Affect:  Congruent and Restricted  Thought Process:  Coherent and Descriptions of Associations: Intact  Orientation:  Full (Time, Place, and Person)  Thought Content:  Logical and Tangential  Suicidal Thoughts:  No  Homicidal Thoughts:  No  Memory:  Immediate;   Fair  Judgement:  Fair  Insight:  Shallow  Psychomotor Activity:  Normal  Concentration:  Good  Recall:  [employer] of Knowledge:Fair  Language: Fair  Akathisia:  Negative  Handed:  Right  AIMS (if indicated):     Assets:  Communication Skills Housing Leisure Time Resilience  Sleep:  Number of Hours: 6  Cognition: WNL  ADL's:  Intact   Mental Status Per Nursing Assessment::   On Admission:  NA, Suicidal ideation indicated by others, Self-harm behaviors  Demographic Factors:  Male  Loss Factors: Decrease in vocational status  Historical Factors: NA  Risk Reduction Factors:   Sense of responsibility to family and Religious beliefs about death  Continued Clinical Symptoms:  Alcohol/Substance Abuse/Dependencies  Cognitive Features That Contribute To Risk:  Thought constriction (tunnel vision)    Suicide Risk:  Minimal: No identifiable suicidal ideation.  Patients presenting with no risk factors but with morbid ruminations; may be classified as minimal risk  based on the severity of the depressive symptoms    Plan Of Care/Follow-up recommendations:  Activity:  full  Ruby Logiudice, MD 07/26/2018, 9:38 AM

## 2018-07-26 NOTE — BHH Counselor (Signed)
Patient is discharging within 18 hours of admission, as such, a PSA was not completed with patient.  Enid Cutter, LCSW-A Clinical Social Worker

## 2018-07-26 NOTE — Progress Notes (Signed)
Discharge Note:  Patient discharged with family member.  Patient denied SI and HI.  Denied A/V hallucinations.  Suicide prevention information given and discussed with patient who stated he understood and had no questions.  Patient stated he received all his belongings.  All required discharge information given to patient.  Patient stated he appreciated all assistance received from [facility] staff.

## 2018-07-26 NOTE — Progress Notes (Signed)
  [facility] Adult Case Management Discharge Plan :  Will you be returning to the same living situation after discharge:  Yes,  home At discharge, do you have transportation home?: Yes,  grandmother is picking him up Do you have the ability to pay for your medications: Yes,  Medicare  Release of information consent forms completed and in the chart. Patient to Follow up at: Patient declines out patient follow up.  Next level of care provider has access to [facility] Link:no  Safety Planning and Suicide Prevention discussed: Yes,  with patient. Patient declines other consents.  Have you used any form of tobacco in the last 30 days? (Cigarettes, Smokeless Tobacco, Cigars, and/or Pipes): Yes  Has patient been referred to the Quitline?: Patient refused referral  Patient has been referred for addiction treatment: Pt. refused referral  Darreld Mclean, LCSWA 07/26/2018, 9:59 AM
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
## 2018-07-26 NOTE — Tx Team (Signed)
Interdisciplinary Treatment and Diagnostic Plan Update  07/26/2018 Time of Session:9:00am [employer] MRN: [id number]  Principal Diagnosis: <principal problem not specified>  Secondary Diagnoses: Active Problems:   Bipolar 1 disorder (HCC)   Current Medications:  Current Facility-Administered Medications  Medication Dose Route Frequency Provider Last Rate Last Dose  . alum & mag hydroxide-simeth (MAALOX/MYLANTA) 200-200-20 MG/5ML suspension 30 mL  30 mL Oral Q4H PRN Denzil Magnuson, NP      . clonazePAM Scarlette Calico) tablet 0.5 mg  0.5 mg Oral BID PRN Cobos, Rockey Situ, MD      . cloNIDine (CATAPRES) tablet 0.2 mg  0.2 mg Oral BID Cobos, Rockey Situ, MD   0.2 mg at 07/25/18 1907  . gabapentin (NEURONTIN) capsule 300 mg  300 mg Oral TID Cobos, Rockey Situ, MD   300 mg at 07/26/18 0742  . metFORMIN (GLUCOPHAGE) tablet 1,000 mg  1,000 mg Oral BID WC Cobos, Rockey Situ, MD   1,000 mg at 07/26/18 0742  . oxyCODONE (Oxy IR/ROXICODONE) immediate release tablet 5 mg  5 mg Oral Q8H PRN Cobos, Rockey Situ, MD   5 mg at 07/26/18 9528   PTA Medications: Medications Prior to Admission  Medication Sig Dispense Refill Last Dose  . albuterol (PROVENTIL HFA;VENTOLIN HFA) 108 (90 Base) MCG/ACT inhaler Inhale 1-2 puffs into the lungs every 6 (six) hours as needed for wheezing or shortness of breath. 1 Inhaler 0 07/24/2018 at noon  . amphetamine-dextroamphetamine (ADDERALL) 30 MG tablet Take 60 mg by mouth daily.   07/24/2018 at am  . atorvastatin (LIPITOR) 20 MG tablet Take 1 tablet (20 mg total) by mouth at bedtime. For high cholesterol (Patient taking differently: Take 20 mg by mouth daily. For high cholesterol)  1 07/24/2018 at am  . Carboxymethylcellulose Sodium (EYE DROPS OP) Apply 2 drops to eye every 3 (three) hours as needed (dry eyes).   03/13/2018 at Unknown time  . clonazePAM (KLONOPIN) 1 MG tablet Take 1 mg by mouth.   3 07/24/2018 at 900  . cloNIDine (CATAPRES) 0.2 MG tablet Take 0.2 mg by mouth  2 (two) times daily.  5 03/13/2018 at Unknown time  . Fluticasone-Salmeterol (ADVAIR) 100-50 MCG/DOSE AEPB Inhale 2 puffs into the lungs 2 (two) times daily as needed (shortness of breath).    couple weeks ago  . gabapentin (NEURONTIN) 400 MG capsule Take 2 capsules (800 mg total) by mouth 3 (three) times daily. For agitation/Neuropathy (Patient not taking: Reported on 07/24/2018) 10 capsule 0 Not Taking at Unknown time  . gabapentin (NEURONTIN) 800 MG tablet Take 800 mg by mouth 3 (three) times daily.   07/24/2018 at am  . hydrochlorothiazide (HYDRODIURIL) 25 MG tablet Take 25 mg by mouth daily.  5 03/13/2018 at Unknown time  . hydrOXYzine (VISTARIL) 50 MG capsule Take 50 mg by mouth See admin instructions. Take one tablet (50 mg) by mouth twice daily, may take a 3rd table (50 mg) at bedtime as needed for itching.     . Ledipasvir-Sofosbuvir (HARVONI) 90-400 MG TABS Take 1 tablet by mouth daily. 28 tablet 2 Past Month at Unknown time  . metFORMIN (GLUCOPHAGE) 1000 MG tablet Take 1 tablet (1,000 mg total) by mouth 2 (two) times daily with a meal. 60 tablet 0 07/24/2018 at am  . omeprazole (PRILOSEC) 40 MG capsule Take 1 capsule (40 mg total) by mouth 2 (two) times daily. For acid reflux (Patient taking differently: Take 40 mg by mouth 2 (two) times daily as needed (acid reflux). ) 1 capsule  0 couple weeks ago  . Oxcarbazepine (TRILEPTAL) 300 MG tablet Take 300 mg by mouth daily.  0 03/13/2018 at Unknown time  . oxyCODONE (ROXICODONE) 15 MG immediate release tablet Take 15 mg by mouth every 6 (six) hours.   0 07/24/2018 at 500  . PRESCRIPTION MEDICATION Pt states he is on four different eye drops - last dose 2pm 5/25   07/24/2018 at 1400  . sitaGLIPtin (JANUVIA) 100 MG tablet Take 100 mg by mouth at bedtime.   07/23/2018 at pm  . sitaGLIPtin (JANUVIA) 50 MG tablet Take 1 tablet (50 mg total) by mouth daily with breakfast. (Patient not taking: Reported on 07/24/2018) 30 tablet 0 Not Taking at Unknown time  .  tiZANidine (ZANAFLEX) 4 MG tablet Take 4 mg by mouth 3 (three) times daily.   07/24/2018 at am  . traZODone (DESYREL) 150 MG tablet Take 15,000 mg by mouth at bedtime.   07/23/2018 at pm  . ziprasidone (GEODON) 80 MG capsule Take 80 mg by mouth 2 (two) times daily with a meal.   0 03/13/2018 at Unknown time    Patient Stressors: Health problems Marital or family conflict Substance abuse  Patient Strengths: Ability for insight Communication skills Supportive family/friends  Treatment Modalities: Medication Management, Group therapy, Case management,  1 to 1 session with clinician, Psychoeducation, Recreational therapy.   Physician Treatment Plan for Primary Diagnosis: <principal problem not specified> Long Term Goal(s): Improvement in symptoms so as ready for discharge Improvement in symptoms so as ready for discharge   Short Term Goals: Ability to identify changes in lifestyle to reduce recurrence of condition will improve Ability to verbalize feelings will improve Ability to disclose and discuss suicidal ideas Ability to demonstrate self-control will improve Ability to identify and develop effective coping behaviors will improve Ability to maintain clinical measurements within normal limits will improve Ability to identify changes in lifestyle to reduce recurrence of condition will improve Ability to verbalize feelings will improve Ability to disclose and discuss suicidal ideas Ability to demonstrate self-control will improve Ability to identify and develop effective coping behaviors will improve Ability to maintain clinical measurements within normal limits will improve  Medication Management: Evaluate patient's response, side effects, and tolerance of medication regimen.  Therapeutic Interventions: 1 to 1 sessions, Unit Group sessions and Medication administration.  Evaluation of Outcomes: Adequate for Discharge  Physician Treatment Plan for Secondary Diagnosis: Active  Problems:   Bipolar 1 disorder (HCC)  Long Term Goal(s): Improvement in symptoms so as ready for discharge Improvement in symptoms so as ready for discharge   Short Term Goals: Ability to identify changes in lifestyle to reduce recurrence of condition will improve Ability to verbalize feelings will improve Ability to disclose and discuss suicidal ideas Ability to demonstrate self-control will improve Ability to identify and develop effective coping behaviors will improve Ability to maintain clinical measurements within normal limits will improve Ability to identify changes in lifestyle to reduce recurrence of condition will improve Ability to verbalize feelings will improve Ability to disclose and discuss suicidal ideas Ability to demonstrate self-control will improve Ability to identify and develop effective coping behaviors will improve Ability to maintain clinical measurements within normal limits will improve     Medication Management: Evaluate patient's response, side effects, and tolerance of medication regimen.  Therapeutic Interventions: 1 to 1 sessions, Unit Group sessions and Medication administration.  Evaluation of Outcomes: Adequate for Discharge   RN Treatment Plan for Primary Diagnosis: <principal problem not specified> Long Term Goal(s): Knowledge  of disease and therapeutic regimen to maintain health will improve  Short Term Goals: Ability to demonstrate self-control  Medication Management: RN will administer medications as ordered by provider, will assess and evaluate patient's response and provide education to patient for prescribed medication. RN will report any adverse and/or side effects to prescribing provider.  Therapeutic Interventions: 1 on 1 counseling sessions, Psychoeducation, Medication administration, Evaluate responses to treatment, Monitor vital signs and CBGs as ordered, Perform/monitor CIWA, COWS, AIMS and Fall Risk screenings as ordered, Perform  wound care treatments as ordered.  Evaluation of Outcomes: Adequate for Discharge   LCSW Treatment Plan for Primary Diagnosis: <principal problem not specified> Long Term Goal(s): Safe transition to appropriate next level of care at discharge, Engage patient in therapeutic group addressing interpersonal concerns.  Short Term Goals: Identify triggers associated with mental health/substance abuse issues and Increase skills for wellness and recovery  Therapeutic Interventions: Assess for all discharge needs, 1 to 1 time with Social worker, Explore available resources and support systems, Assess for adequacy in community support network, Educate family and significant other(s) on suicide prevention, Complete Psychosocial Assessment, Interpersonal group therapy.  Evaluation of Outcomes: Adequate for Discharge   Progress in Treatment: Attending groups: Yes. Participating in groups: Yes. Taking medication as prescribed: Yes. Toleration medication: Yes. Family/Significant other contact made: No, will contact:  declines consents for SPE, pamphlet will be provided to patient to share with supports. Patient understands diagnosis: Yes. Discussing patient identified problems/goals with staff: Yes. Medical problems stabilized or resolved: Yes. Denies suicidal/homicidal ideation: No. Issues/concerns per patient self-inventory: Yes.  New problem(s) identified: No, Describe:  none  New Short Term/Long Term Goal(s): medication management for mood stabilization; elimination of SI thoughts; development of comprehensive mental wellness/sobriety plan.  Patient Goals:  discharge  Discharge Plan or Barriers: Patient reports he has his own psychiatrist and [facility]  Reason for Continuation of Hospitalization: Anxiety Depression  Estimated Length of Stay: discharge today   Attendees: Patient: Bryan Gallegos 07/26/2018 8:30 AM  Physician:  07/26/2018 8:30 AM   Nursing:  07/26/2018 8:30 AM  RN Care Manager: 07/26/2018 8:30 AM  Social Worker: Enid Cutterharlotte Galdino Hinchman, LCSWA 07/26/2018 8:30 AM  Recreational Therapist:  07/26/2018 8:30 AM  Other:  07/26/2018 8:30 AM  Other:  07/26/2018 8:30 AM  Other: 07/26/2018 8:30 AM    Scribe for Treatment Team: Bryan Gallegos, LCSWA 07/26/2018 8:30 AM

## 2018-07-26 NOTE — Progress Notes (Signed)
CSW met with patient on unit. He expresses readiness and eagerness to discharge today. He declines consents for collateral contacts and SPE. He denies SI/HI/AH/VH. He reports he schedules his own appointments with [facility] for therapy and medication management. He declines additional referrals from [place].  Stephanie Acre, LCSW-A Clinical Social Worker

## 2018-07-26 NOTE — Discharge Summary (Signed)
Physician Discharge Summary Note  Patient:  Bryan Gallegos is an 43 y.o., male MRN:  [id number] DOB:  [birth year] Patient phone:  [phone] (home)  Patient address:   [address] [place] [address],  Total Time spent with patient: 45 minutes  Date of Admission:  07/25/2018 Date of Discharge: 07/26/2018  Reason for Admission:   43 year old male.Patient presented to the ED following an overdose on prescribed Trazodone. States " I guess I took about 10 tablets". States he called EMS . At this time patient reports that overdose was accidental, and states that he had medications on his nightstand that fell on the floor, leading to confusion/ overdose . Currently denies suicidal intent , and states " I have religious beliefs, I do not want to go to hell, I am not going to kill myself ". He does endorse depression, and reports he has struggled with depression/loss after his wife passed away in 07/12/2016. States that her birthday was several days ago, and that her anniversary of death is coming up. States " so naturally I am sad, but I do not want to die or to kill myself ". Of note, patient reports long term management with Klonopin for anxiety and with Oxycodone for chronic pain. [place] controlled substance reviewed /confirmed . Patient denies abusing these medications. Admission UDS positive for BZDS, negative for Opiates, BAL negative.  Principal Problem: Reports overdose was not intentional Discharge Diagnoses: Active Problems:   Bipolar 1 disorder (HCC)   Past Psychiatric History: see eval  Past Medical History:  Past Medical History:  Diagnosis Date  . Adult ADHD   . Anaphylactic reaction   . Anxiety   . Asthma   . Bipolar disorder (HCC)   . Complication of anesthesia    Per patient difficult intubation;  . Diabetes mellitus   . Difficult intubation    Per patient  . Drug overdose   . Heart valve disorder    s/p echocardiogram  . Hyperlipidemia   . Hypertension   .  Morbidly obese (HCC)   . OSA (obstructive sleep apnea)   . Polysubstance abuse (HCC)   . Transient cerebral ischemia    Unknown    Past Surgical History:  Procedure Laterality Date  . CARPAL TUNNEL RELEASE    . ESOPHAGOGASTRODUODENOSCOPY N/A 04/05/2015   Procedure: ESOPHAGOGASTRODUODENOSCOPY (EGD);  Surgeon: Malissa Hippo, MD;  Location: [facility];  Service: Endoscopy;  Laterality: N/A;  . ESOPHAGOGASTRODUODENOSCOPY (EGD) WITH PROPOFOL N/A 05/21/2015   Procedure: ESOPHAGOGASTRODUODENOSCOPY (EGD) WITH PROPOFOL;  Surgeon: Meryl Dare, MD;  Location: [facility] ENDOSCOPY;  Service: Endoscopy;  Laterality: N/A;  . NOSE SURGERY    . TOOTH EXTRACTION    . WISDOM TOOTH EXTRACTION     Family History:  Family History  Problem Relation Age of Onset  . CAD Mother        Living  . Diabetes Mellitus II Mother   . Stroke Mother   . Hypertension Mother   . Congestive Heart Failure Mother   . Kidney disease Mother   . Fibromyalgia Mother   . Thyroid disease Mother   . Hyperlipidemia Mother   . Liver disease Mother   . Alcoholism Father 84       Deceased  . Arthritis Maternal Grandmother   . Congestive Heart Failure Maternal Grandmother   . Hypertension Maternal Grandmother   . Lung cancer Maternal Grandfather   . Colon cancer Maternal Aunt   . Stomach cancer Maternal Aunt   .  Heart disease Other        Paternal & Maternal  . Crohn's disease Other   . Hypertension Other        Paternal & Maternal  . Hypertension Brother        x3  . Hypertension Sister        #1  . Bipolar disorder Sister        #1  . ADD / ADHD Son        x3  . Bipolar disorder Son        x3  . Asperger's syndrome Son    Family Psychiatric  History: neg for new data Social History:  Social History   Substance and Sexual Activity  Alcohol Use No  . Alcohol/week: 0.0 standard drinks     Social History   Substance and Sexual Activity  Drug Use Not Currently  . Types: Benzodiazepines, Heroin    Social  History   Socioeconomic History  . Marital status: Single    Spouse name: Not on file  . Number of children: Not on file  . Years of education: Not on file  . Highest education level: Not on file  Occupational History  . Not on file  Social Needs  . Financial resource strain: Not on file  . Food insecurity:    Worry: Not on file    Inability: Not on file  . Transportation needs:    Medical: Not on file    Non-medical: Not on file  Tobacco Use  . Smoking status: Current Every Day Smoker    Packs/day: 0.50    Years: 23.00    Pack years: 11.50    Types: Cigarettes  . Smokeless tobacco: Never Used  Substance and Sexual Activity  . Alcohol use: No    Alcohol/week: 0.0 standard drinks  . Drug use: Not Currently    Types: Benzodiazepines, Heroin  . Sexual activity: Not Currently    Partners: Female  Lifestyle  . Physical activity:    Days per week: Not on file    Minutes per session: Not on file  . Stress: Not on file  Relationships  . Social connections:    Talks on phone: Not on file    Gets together: Not on file    Attends religious service: Not on file    Active member of club or organization: Not on file    Attends meetings of clubs or organizations: Not on file    Relationship status: Not on file  Other Topics Concern  . Not on file  Social History Narrative  . Not on file    Hospital Course:   Patient was admitted under routine precautions always denied that he was suicidal and cited numerous supporting information that he was religious that he thought suicides went to hell he and instructed us that he was in no way suicidal but we thought that his polypharmacy was problematic to include stimulants opiates benzodiazepines and we offered him detox but he refused.  By the date of the 27th he was requesting discharge he was alert oriented and cooperative again I stressed to him that he could benefit from a detox regimen he should come off of all the schedule to  compounds simply because by his own admission he was confused when he overtook the 10 trazodone tablets but he would hear none of it no amount of motivational interviewing would convince him to attempt detox and rehab so he is discharged home he is at  high risk for accidental overdose but we are not going to prescribe anything new in the discharge med recommendations simply reflect my recommendations but he states he will continue his opiates and benzodiazepines and stimulants  Physical Findings: AIMS: Facial and Oral Movements Muscles of Facial Expression: None, normal Lips and Perioral Area: None, normal Jaw: None, normal Tongue: None, normal,Extremity Movements Upper (arms, wrists, hands, fingers): None, normal Lower (legs, knees, ankles, toes): None, normal, Trunk Movements Neck, shoulders, hips: None, normal, Overall Severity Severity of abnormal movements (highest score from questions above): None, normal Incapacitation due to abnormal movements: None, normal Patient's awareness of abnormal movements (rate only patient's report): No Awareness, Dental Status Current problems with teeth and/or dentures?: No Does patient usually wear dentures?: No  CIWA:    COWS:    Musculoskeletal: Strength & Muscle Tone: within normal limits Gait & Station: normal Patient leans: N/A  Psychiatric Specialty Exam: ROS  Blood pressure 108/63, pulse (!) 56, temperature 99.3 F (37.4 C), temperature source Oral, resp. rate 18, height  (1.88 m), weight (!) 160.6 kg, SpO2 96 %.Body mass index is 45.45 kg/m.  General Appearance: Casual  Eye Contact::  Good  Speech:  Clear and Coherent409  Volume:  Normal  Mood:  Irritable  Affect:  Congruent and Restricted  Thought Process:  Coherent and Descriptions of Associations: Intact  Orientation:  Full (Time, Place, and Person)  Thought Content:  Logical and Tangential  Suicidal Thoughts:  No  Homicidal Thoughts:  No  Memory:  Immediate;   Fair   Judgement:  Fair  Insight:  Shallow  Psychomotor Activity:  Normal  Concentration:  Good  Recall:  Fair  Fund of Knowledge:Fair  Language: Fair  Akathisia:  Negative  Handed:  Right  AIMS (if indicated):     Assets:  Communication Skills Housing Leisure Time Resilience  Sleep:  Number of Hours: 6  Cognition: WNL  ADL's:  Intact        Have you used any form of tobacco in the last 30 days? (Cigarettes, Smokeless Tobacco, Cigars, and/or Pipes): Yes  Has this patient used any form of tobacco in the last 30 days? (Cigarettes, Smokeless Tobacco, Cigars, and/or Pipes) Yes, No  Blood Alcohol level:  Lab Results  Component Value Date   ETH <10 07/24/2018   ETH <10 02/08/2017    Metabolic Disorder Labs:  Lab Results  Component Value Date   HGBA1C 6.2 (H) 03/16/2018   MPG 131.24 03/16/2018   MPG 134.11 03/14/2018   No results found for: PROLACTIN Lab Results  Component Value Date   CHOL 148 09/04/2013   TRIG 327 (H) 01/06/2016   HDL 19 (L) 09/04/2013   CHOLHDL 7.8 09/04/2013   VLDL 46 (H) 09/04/2013   LDLCALC 83 09/04/2013   LDLCALC 119 (H) 08/07/2013    See Psychiatric Specialty Exam and Suicide Risk Assessment completed by Attending Physician prior to discharge.  Discharge destination:  Home  Is patient on multiple antipsychotic therapies at discharge:  No   Has Patient had three or more failed trials of antipsychotic monotherapy by history:  No  Recommended Plan for Multiple Antipsychotic Therapies: NA   Allergies as of 07/26/2018      Reactions   Ace Inhibitors Anaphylaxis   Atenolol Anaphylaxis   Lisinopril Anaphylaxis   Prednisone Hives   Ibuprofen Itching, Swelling, Other (See Comments)   Hand/feet swelling    Rexulti [brexpiprazole] Swelling   Tylenol [acetaminophen] Other (See Comments)   Reaction:  GI bleeding  Medication List    STOP taking these medications   amphetamine-dextroamphetamine 30 MG tablet Commonly known as:   ADDERALL   clonazePAM 1 MG tablet Commonly known as:  KLONOPIN   oxyCODONE 15 MG immediate release tablet Commonly known as:  ROXICODONE   tiZANidine 4 MG tablet Commonly known as:  ZANAFLEX     TAKE these medications     Indication  albuterol 108 (90 Base) MCG/ACT inhaler Commonly known as:  VENTOLIN HFA Inhale 1-2 puffs into the lungs every 6 (six) hours as needed for wheezing or shortness of breath.  Indication:  Asthma   atorvastatin 20 MG tablet Commonly known as:  LIPITOR Take 1 tablet (20 mg total) by mouth at bedtime. For high cholesterol What changed:  when to take this  Indication:  Inherited Homozygous Hypercholesterolemia   cloNIDine 0.2 MG tablet Commonly known as:  CATAPRES Take 0.2 mg by mouth 2 (two) times daily.  Indication:  High Blood Pressure Disorder   FLUoxetine 20 MG capsule Commonly known as:  PROZAC Take 3 capsules by mouth daily.  Indication:  Depression   gabapentin 800 MG tablet Commonly known as:  NEURONTIN Take 800 mg by mouth 3 (three) times daily.  Indication:  Abuse or Misuse of Alcohol   metFORMIN 1000 MG tablet Commonly known as:  GLUCOPHAGE Take 1 tablet (1,000 mg total) by mouth 2 (two) times daily with a meal.  Indication:  Type 2 Diabetes   omeprazole 40 MG capsule Commonly known as:  PriLOSEC Take 1 capsule (40 mg total) by mouth 2 (two) times daily. For acid reflux What changed:    when to take this  reasons to take this  additional instructions  Indication:  Gastroesophageal Reflux Disease   sitaGLIPtin 100 MG tablet Commonly known as:  JANUVIA Take 100 mg by mouth at bedtime.  Indication:  Type 2 Diabetes   traZODone 100 MG tablet Commonly known as:  DESYREL Take 250 mg by mouth at bedtime.  Indication:  Drug-Induced Difficulty in Voluntary Movement      Signed: Malvin Johns, MD 07/26/2018, 9:39 AM

## 2018-07-26 NOTE — Progress Notes (Signed)
D:  Patient denied SI and HI, contracts for safety.  Denied A/V hallucinations. A:  Medications administered per MD orders.  Emotional support and encouragement given patient. R:  Safety maintained with 15 minute checks.  Patient stated he wants to be discharged.

## 2018-08-11 IMAGING — CT CT HEAD W/O CM
4 series · 15 of 47 positions shown, 17 images · non-contrast
Comparison: Head CT 01/01/2016.

CLINICAL DATA: 40-year-old male with history of syncopal event
today. Occipital headache.

EXAM:
CT HEAD WITHOUT CONTRAST
TECHNIQUE: Contiguous axial images were obtained from the base of the skull
through the vertex without intravenous contrast.

[Series 3: head without · axial · non-contrast · 0.49mm/px · z∈[-99,+31]mm · 7 of 36 slices shown, 9 images]
[im 5/36  brain]
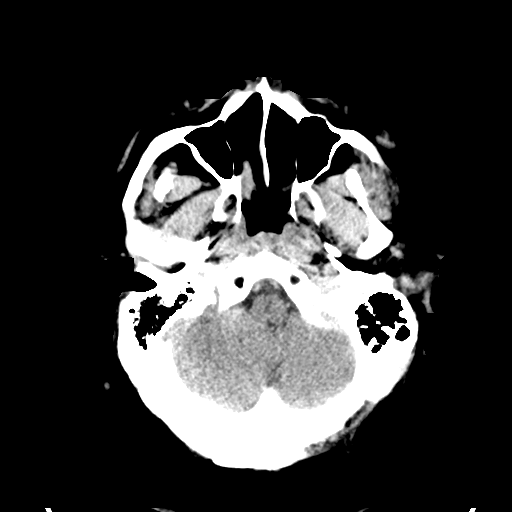
[im 5/36  bone]
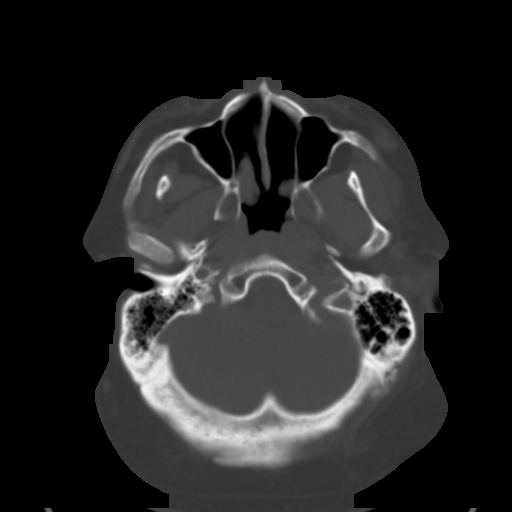
[im 9/36  brain]
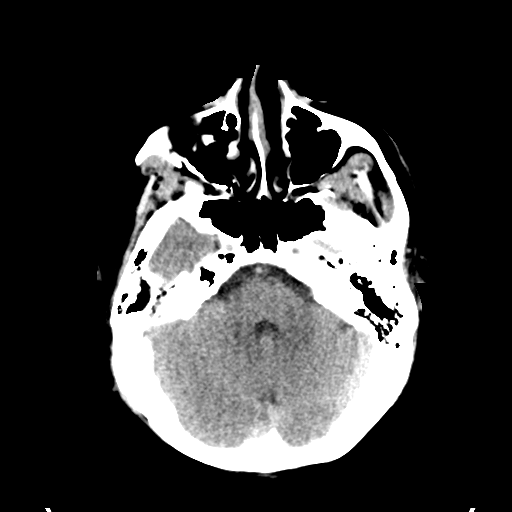
[im 14/36  brain]
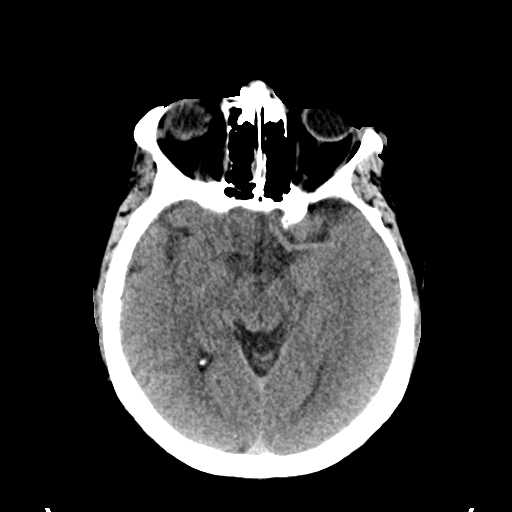
[im 18/36  brain]
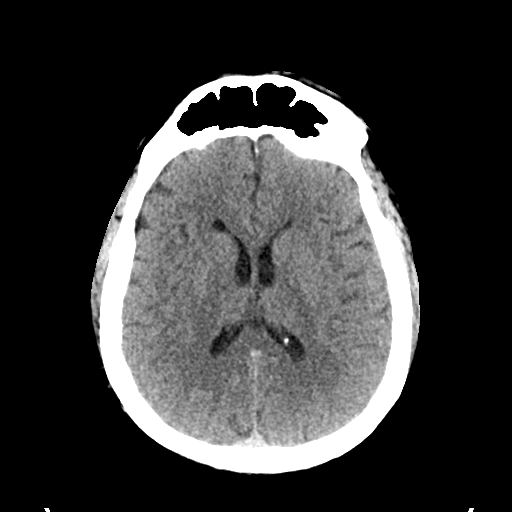
[im 22/36  brain]
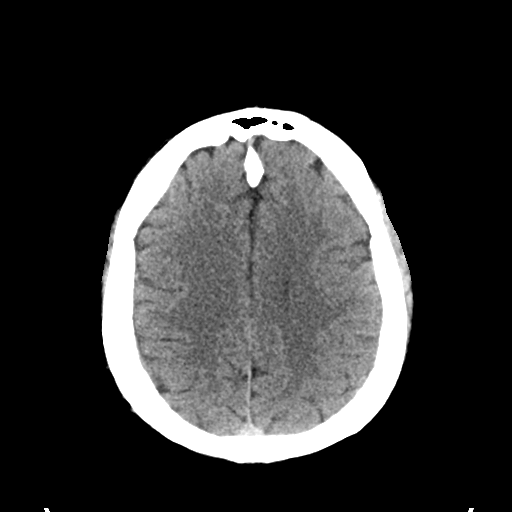
[im 22/36  bone]
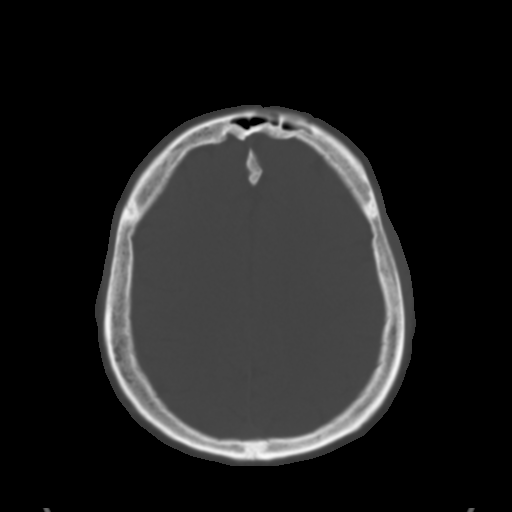
[im 27/36  brain]
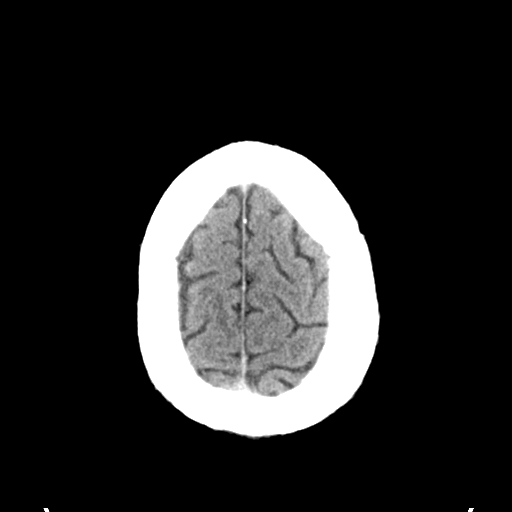
[im 31/36  brain]
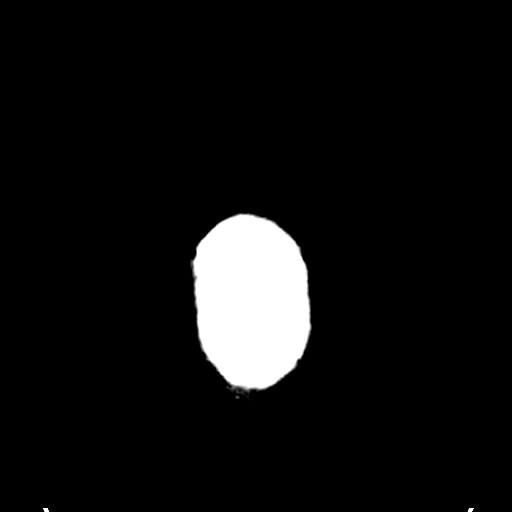

[Series 4: head bone · axial · 0.49mm/px · z∈[-103,-85]mm · 2 of 90 slices shown]
[im 9/90  bone]
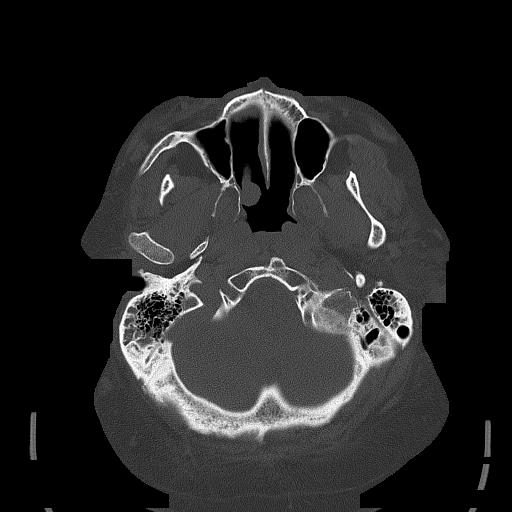
[im 18/90  bone]
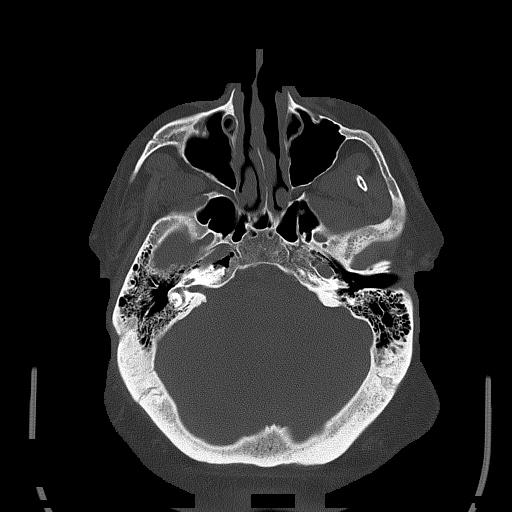

[Series 5: head without cor · coronal · non-contrast · 0.38mm/px · 3 of 76 slices shown]
[im 26/76  brain]
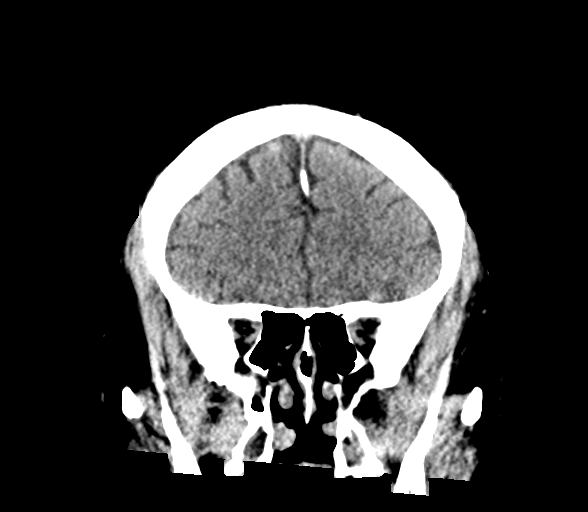
[im 34/76  brain]
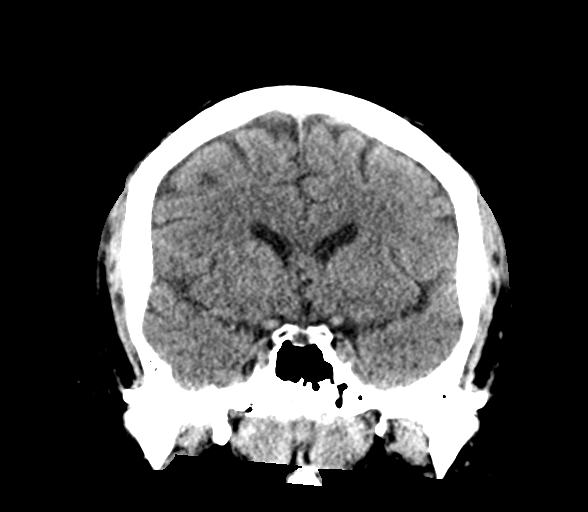
[im 42/76  brain]
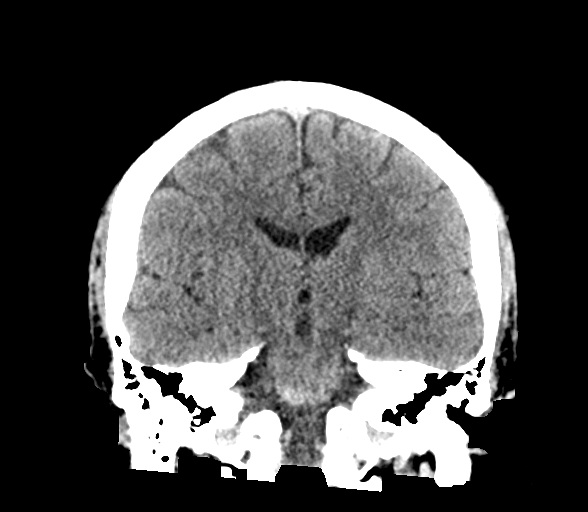

[Series 6: head without sag · sagittal · non-contrast · 0.35mm/px · 3 of 67 slices shown]
[im 23/67  brain]
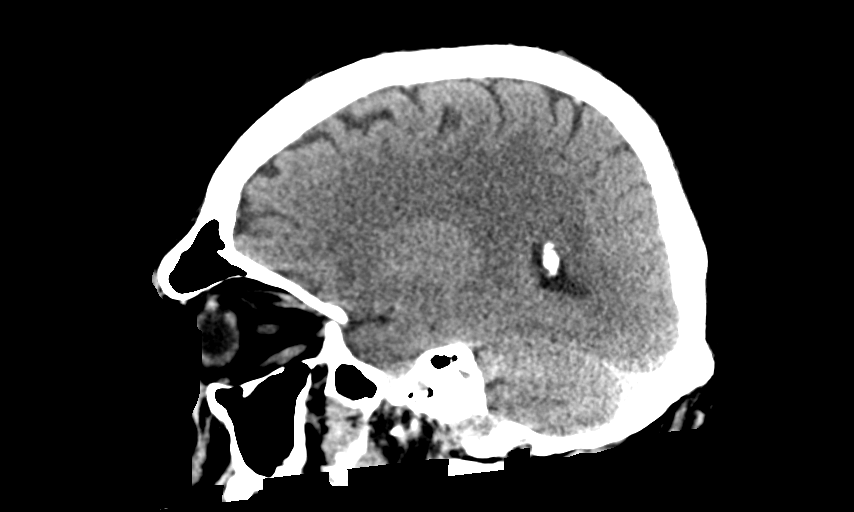
[im 34/67  brain]
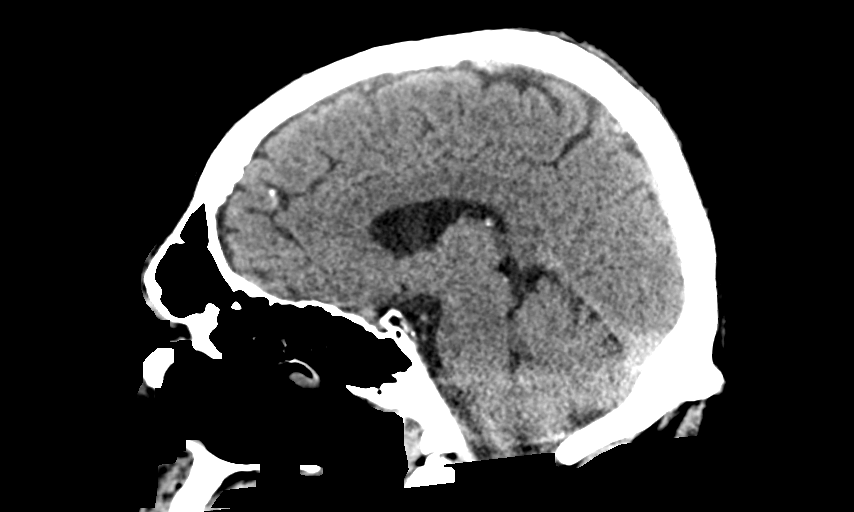
[im 45/67  brain]
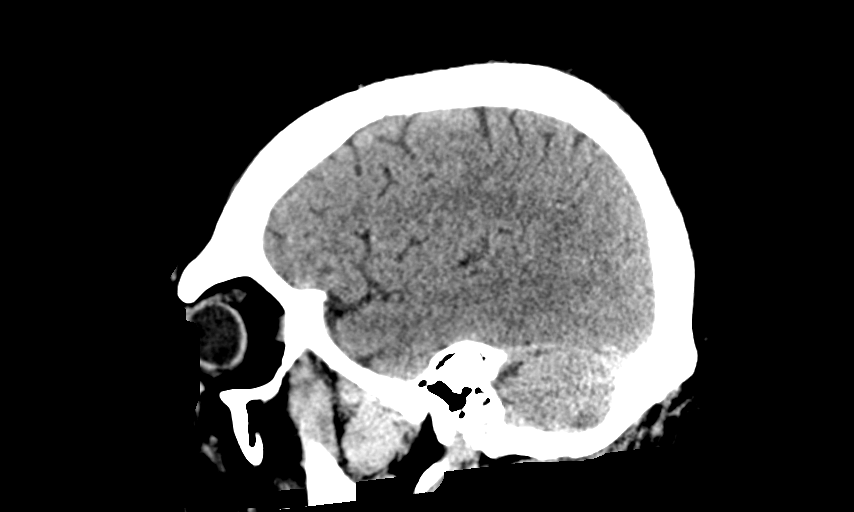

[15 of 47 positions shown; findings below may reference images not displayed]

FINDINGS: Brain: No evidence of acute infarction, hemorrhage, hydrocephalus,
extra-axial collection or mass lesion/mass effect.

Vascular: No hyperdense vessel or unexpected calcification.

Skull: Normal. Negative for fracture or focal lesion.

Sinuses/Orbits: No acute finding.

Other: Small right mastoid effusion.
IMPRESSION: 1. No acute intracranial abnormalities. The appearance of the brain
is normal.
2. Small right mastoid effusion.

## 2018-08-11 IMAGING — DX DG CHEST 1V PORT
1 series · 1 of 1 positions shown · non-contrast
Comparison: 09/07/2016

CLINICAL DATA: Post CPR, syncope

EXAM:
PORTABLE CHEST 1 VIEW

[chest ap]
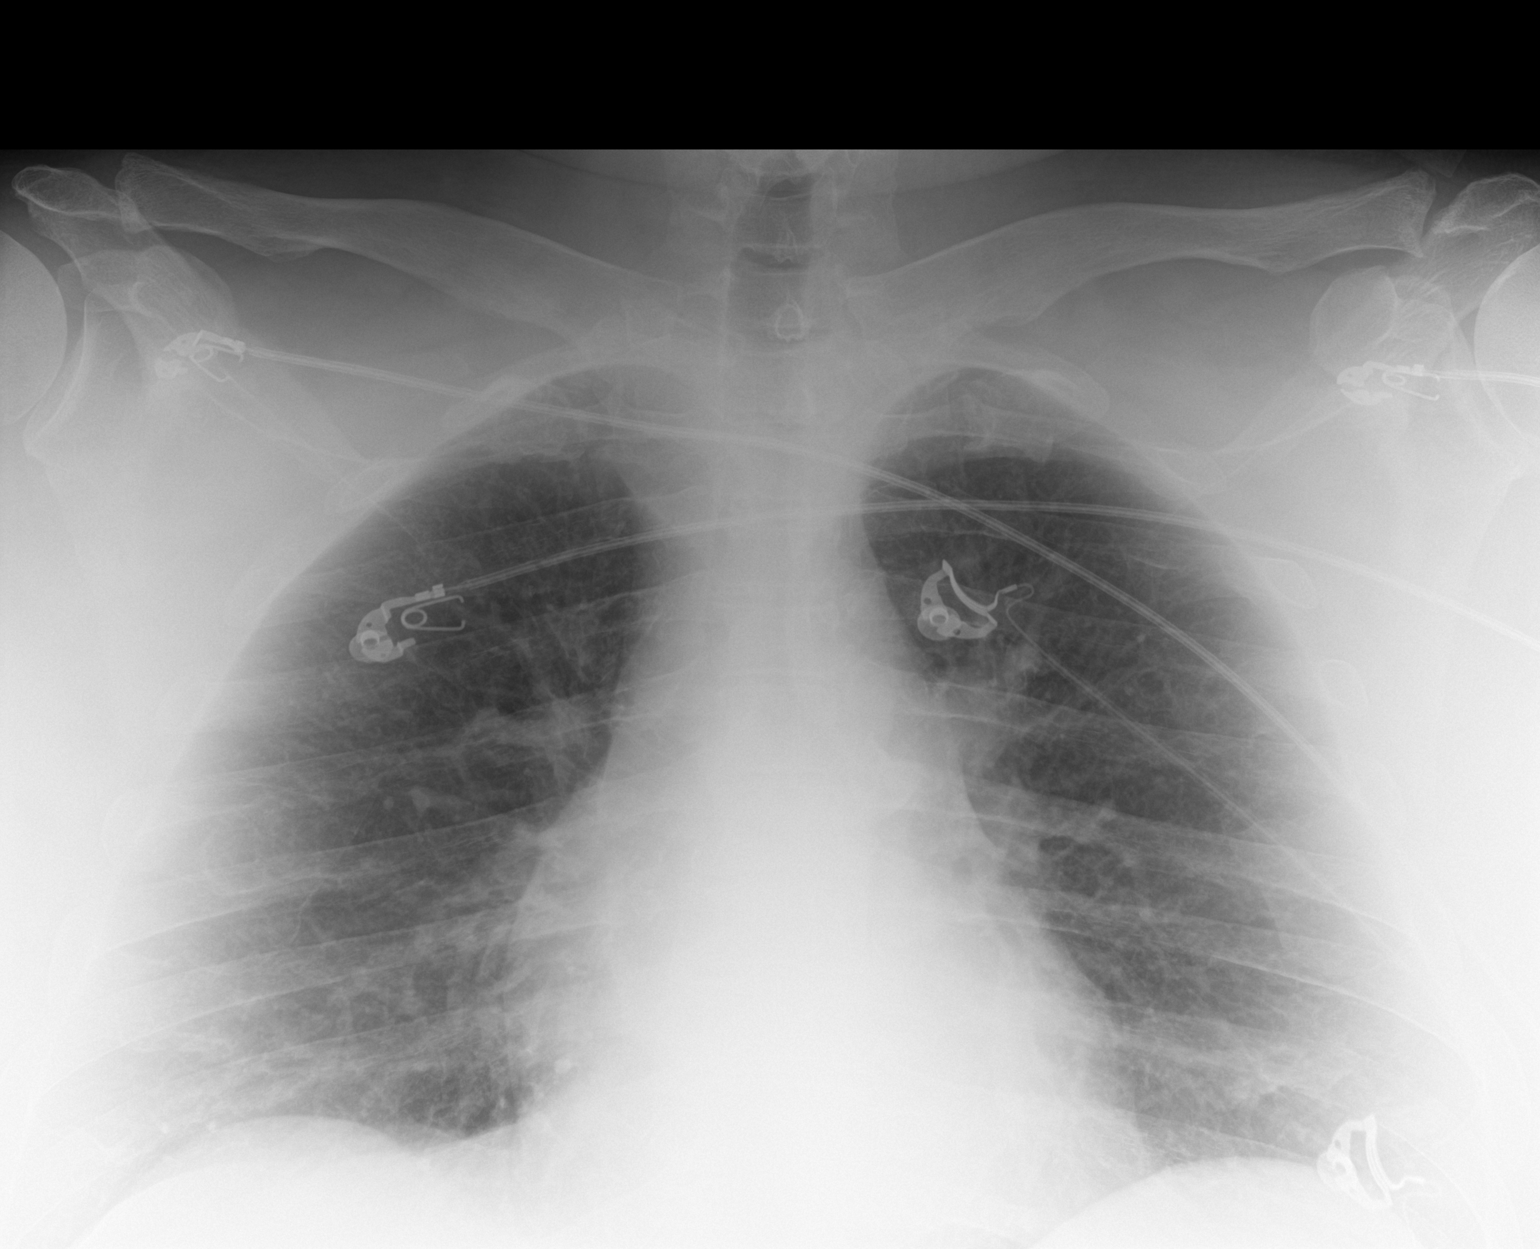

[1 of 1 positions shown; findings below may reference images not displayed]

FINDINGS: Heart is borderline in size, accentuated by the AP lordotic nature
of the study. No confluent airspace opacities or effusions. No acute
bony abnormality.
IMPRESSION: No active disease.

## 2018-08-11 IMAGING — CT CT ANGIO CHEST-ABD-PELV FOR DISSECTION W/ AND WO/W CM
2 of 7 series · 15 of 46 positions shown, 17 images · IV contrast (OMNI 350)
Comparison: 06/19/2016, 06/07/2014

CLINICAL DATA: Acute chest pain.

EXAM:
CT ANGIOGRAPHY CHEST, ABDOMEN AND PELVIS
TECHNIQUE: Multidetector CT imaging through the chest, abdomen and pelvis was
performed using the standard protocol during bolus administration of
intravenous contrast. Multiplanar reconstructed images and MIPs were
obtained and reviewed to evaluate the vascular anatomy.
CONTRAST:  100 mL Isovue 370

[Series 7: dissection 2mm · axial · 0.98mm/px · z∈[+764,+1450]mm · 12 of 383 slices shown, 14 images]
[im 20/383  soft-tissue]
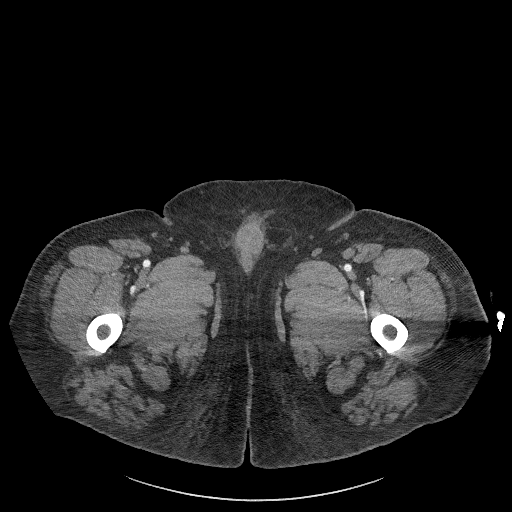
[im 20/383  bone]
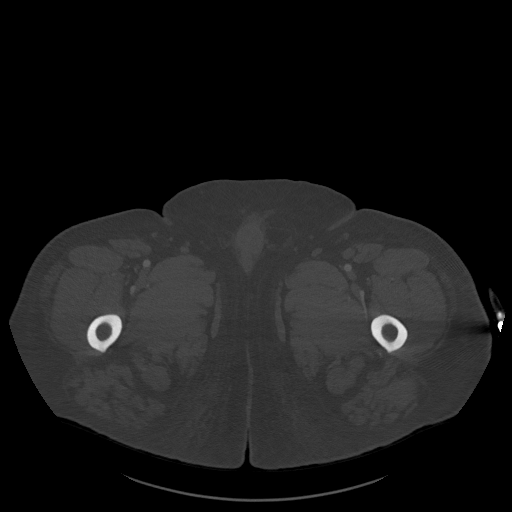
[im 58/383  soft-tissue]
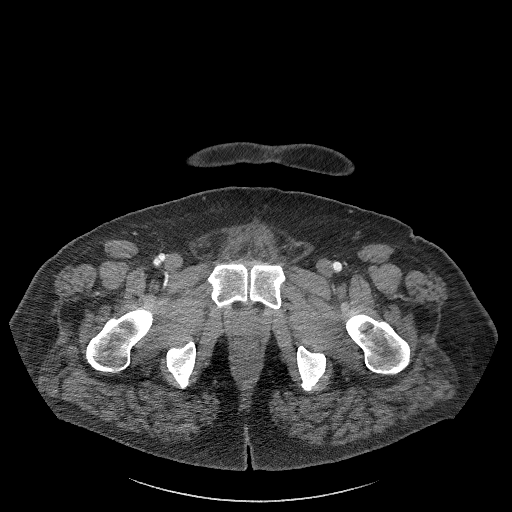
[im 77/383  soft-tissue]
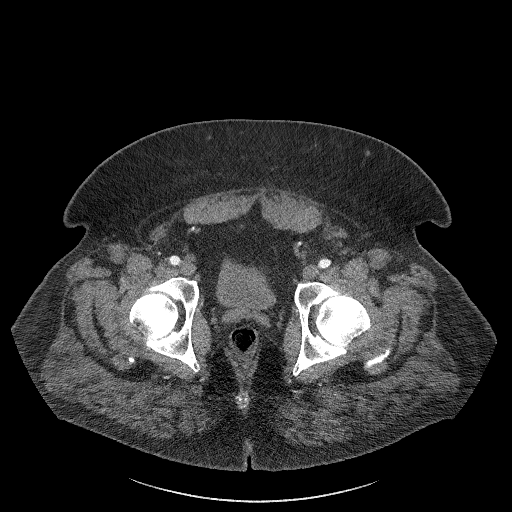
[im 115/383  soft-tissue]
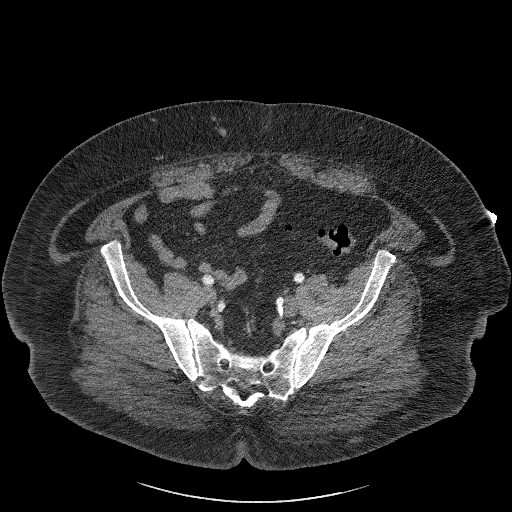
[im 153/383  soft-tissue]
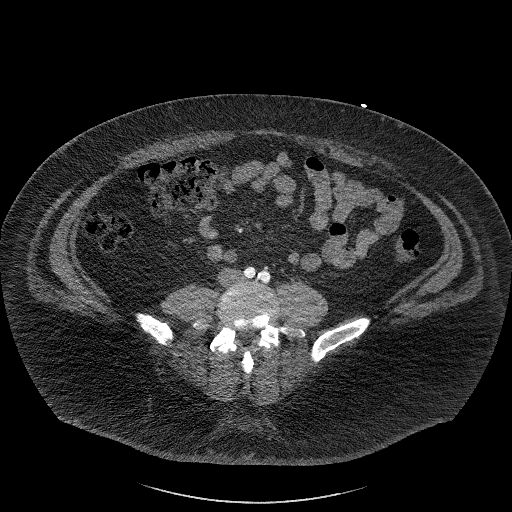
[im 172/383  soft-tissue]
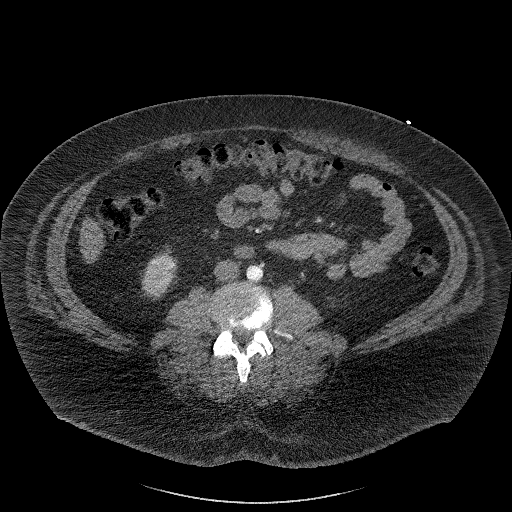
[im 211/383  soft-tissue]
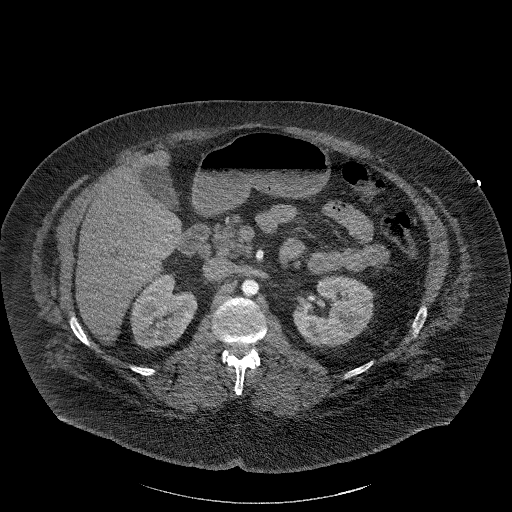
[im 230/383  soft-tissue]
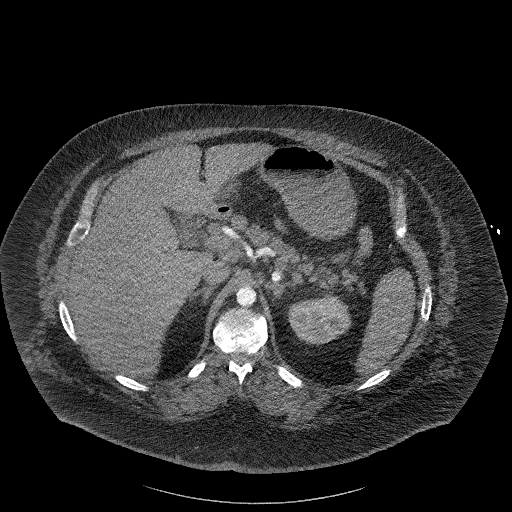
[im 268/383  soft-tissue]
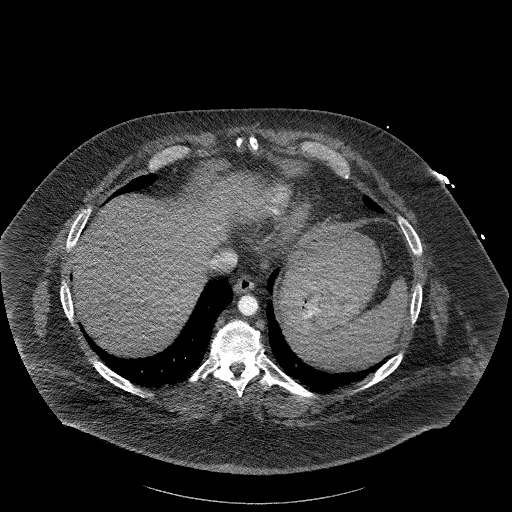
[im 268/383  bone]
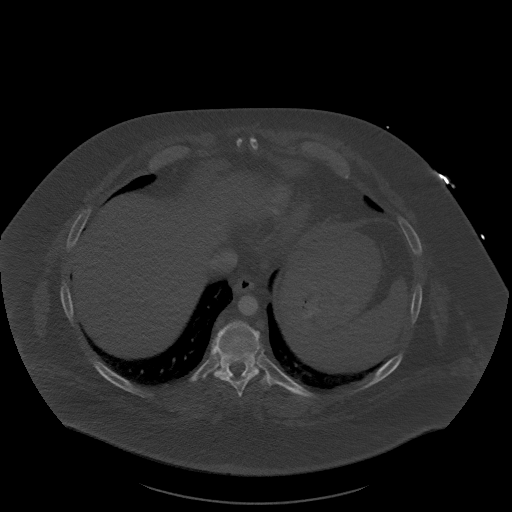
[im 306/383  soft-tissue]
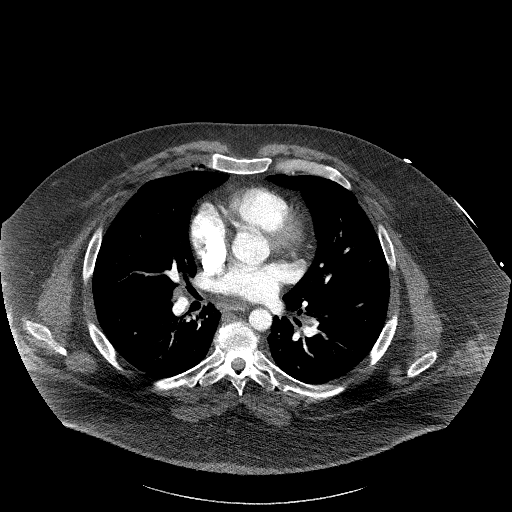
[im 325/383  soft-tissue]
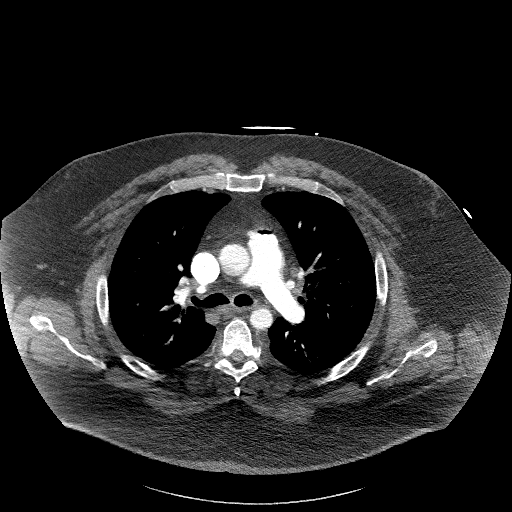
[im 363/383  soft-tissue]
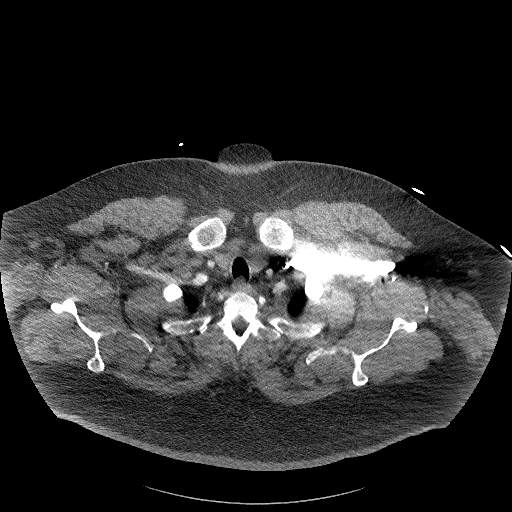

[Series 10: dissection 2mm cor · coronal · 1.20mm/px · 3 of 193 slices shown]
[im 49/193  soft-tissue]
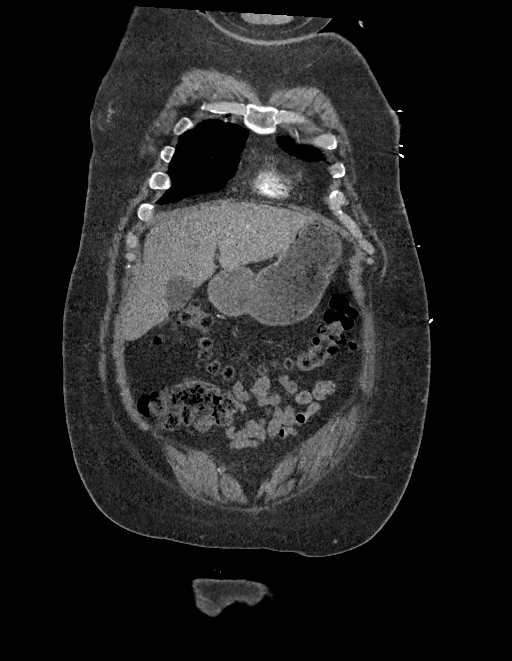
[im 97/193  soft-tissue]
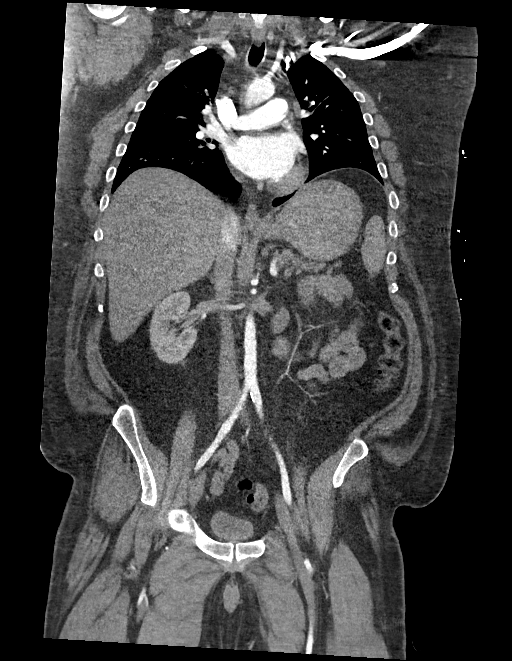
[im 145/193  soft-tissue]
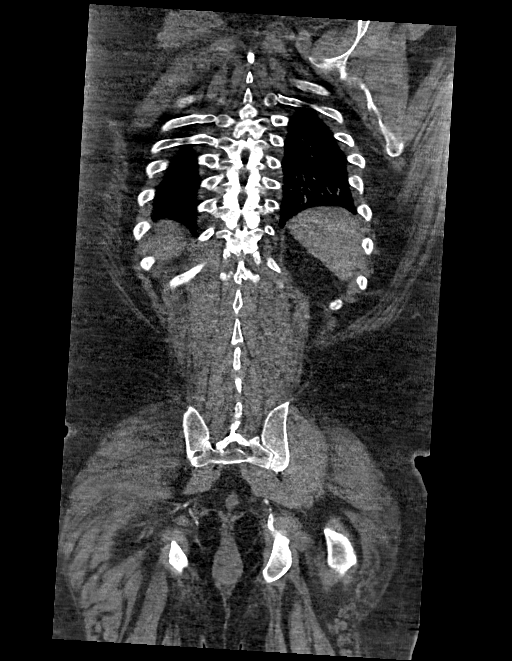

[15 of 46 positions shown; findings below may reference images not displayed]

FINDINGS: CTA CHEST FINDINGS

Cardiovascular: Preferential opacification of the thoracic aorta. No
evidence of thoracic aortic aneurysm or dissection. Normal heart
size. No pericardial effusion. Small amount air in the pulmonary
artery likely iatrogenic from the IV.

Mediastinum/Nodes: No enlarged mediastinal, hilar, or axillary lymph
nodes. Thyroid gland, trachea, and esophagus demonstrate no
significant findings.

Lungs/Pleura: Stable 6 mm subpleural right middle lobe pulmonary
nodule. Stable 7 mm right lower lobe pulmonary nodule (image
85/series 8). No focal consolidation, pleural effusion or
pneumothorax.

Musculoskeletal: No chest wall abnormality. No acute or significant
osseous findings.

Review of the MIP images confirms the above findings.

CTA ABDOMEN AND PELVIS FINDINGS

VASCULAR

Aorta: Normal caliber aorta without aneurysm, dissection, vasculitis
or significant stenosis.

Celiac: Patent without evidence of aneurysm, dissection, vasculitis
or significant stenosis.

SMA: Patent without evidence of aneurysm, dissection, vasculitis or
significant stenosis.

Renals: Both renal arteries are patent without evidence of aneurysm,
dissection, vasculitis, fibromuscular dysplasia or significant
stenosis.

IMA: Patent without evidence of aneurysm, dissection, vasculitis or
significant stenosis.

Inflow: Patent without evidence of aneurysm, dissection, vasculitis
or significant stenosis.

Veins: No obvious venous abnormality within the limitations of this
arterial phase study.

Review of the MIP images confirms the above findings.

NON-VASCULAR

Hepatobiliary: No focal liver abnormality is seen. No gallstones,
gallbladder wall thickening, or biliary dilatation.

Pancreas: Unremarkable. No pancreatic ductal dilatation or
surrounding inflammatory changes.

Spleen: Normal in size without focal abnormality.

Adrenals/Urinary Tract: Adrenal glands are unremarkable. Kidneys are
normal, without renal calculi, focal lesion, or hydronephrosis.
Bladder is unremarkable.

Stomach/Bowel: Stomach is within normal limits. Appendix appears
normal. No evidence of bowel wall thickening, distention, or
inflammatory changes.

Lymphatic: Portacaval lymphadenopathy with the largest portacaval
lymph node measuring 17 mm. No other abdominal or pelvic
lymphadenopathy.

Reproductive: Prostate is unremarkable.

Other: No fluid collection or hematoma.

Musculoskeletal: No acute osseous abnormality. No lytic or sclerotic
osseous lesion. Mild bilateral facet arthropathy of the lumbar
spine.

Review of the MIP images confirms the above findings.
IMPRESSION: 1. No aortic dissection.  No aortic aneurysm.
2. No active cardiopulmonary disease.
3. No acute abdominal or pelvic pathology.

## 2018-09-16 ENCOUNTER — Inpatient Hospital Stay (HOSPITAL_COMMUNITY)
Admission: EM | Admit: 2018-09-16 | Discharge: 2018-09-21 | DRG: 918 | Disposition: A | Discharge status: Other Acute Inpatient Hospital | Payer: Medicare HMO | Attending: Internal Medicine | Admitting: Internal Medicine

## 2018-09-16 ENCOUNTER — Other Ambulatory Visit: Payer: Self-pay

## 2018-09-16 ENCOUNTER — Emergency Department (HOSPITAL_COMMUNITY): Payer: Medicare HMO

## 2018-09-16 DIAGNOSIS — Z915 Personal history of self-harm: Secondary | ICD-10-CM

## 2018-09-16 DIAGNOSIS — Y92230 Patient room in hospital as the place of occurrence of the external cause: Secondary | ICD-10-CM | POA: Diagnosis not present

## 2018-09-16 DIAGNOSIS — R001 Bradycardia, unspecified: Secondary | ICD-10-CM

## 2018-09-16 DIAGNOSIS — I10 Essential (primary) hypertension: Secondary | ICD-10-CM | POA: Diagnosis present

## 2018-09-16 DIAGNOSIS — Z818 Family history of other mental and behavioral disorders: Secondary | ICD-10-CM

## 2018-09-16 DIAGNOSIS — W2209XA Striking against other stationary object, initial encounter: Secondary | ICD-10-CM | POA: Diagnosis not present

## 2018-09-16 DIAGNOSIS — G8929 Other chronic pain: Secondary | ICD-10-CM | POA: Diagnosis present

## 2018-09-16 DIAGNOSIS — Z6841 Body Mass Index (BMI) 40.0 and over, adult: Secondary | ICD-10-CM

## 2018-09-16 DIAGNOSIS — S0081XA Abrasion of other part of head, initial encounter: Secondary | ICD-10-CM | POA: Diagnosis not present

## 2018-09-16 DIAGNOSIS — R9431 Abnormal electrocardiogram [ECG] [EKG]: Secondary | ICD-10-CM | POA: Diagnosis present

## 2018-09-16 DIAGNOSIS — J45909 Unspecified asthma, uncomplicated: Secondary | ICD-10-CM | POA: Diagnosis present

## 2018-09-16 DIAGNOSIS — Z87892 Personal history of anaphylaxis: Secondary | ICD-10-CM

## 2018-09-16 DIAGNOSIS — F411 Generalized anxiety disorder: Secondary | ICD-10-CM | POA: Diagnosis present

## 2018-09-16 DIAGNOSIS — Z8673 Personal history of transient ischemic attack (TIA), and cerebral infarction without residual deficits: Secondary | ICD-10-CM

## 2018-09-16 DIAGNOSIS — T465X2A Poisoning by other antihypertensive drugs, intentional self-harm, initial encounter: Secondary | ICD-10-CM | POA: Diagnosis not present

## 2018-09-16 DIAGNOSIS — F17210 Nicotine dependence, cigarettes, uncomplicated: Secondary | ICD-10-CM | POA: Diagnosis present

## 2018-09-16 DIAGNOSIS — E662 Morbid (severe) obesity with alveolar hypoventilation: Secondary | ICD-10-CM | POA: Diagnosis present

## 2018-09-16 DIAGNOSIS — Z1159 Encounter for screening for other viral diseases: Secondary | ICD-10-CM

## 2018-09-16 DIAGNOSIS — E873 Alkalosis: Secondary | ICD-10-CM | POA: Diagnosis present

## 2018-09-16 DIAGNOSIS — E119 Type 2 diabetes mellitus without complications: Secondary | ICD-10-CM | POA: Diagnosis present

## 2018-09-16 DIAGNOSIS — T50902A Poisoning by unspecified drugs, medicaments and biological substances, intentional self-harm, initial encounter: Secondary | ICD-10-CM

## 2018-09-16 DIAGNOSIS — Z79899 Other long term (current) drug therapy: Secondary | ICD-10-CM

## 2018-09-16 DIAGNOSIS — E785 Hyperlipidemia, unspecified: Secondary | ICD-10-CM | POA: Diagnosis present

## 2018-09-16 DIAGNOSIS — F3163 Bipolar disorder, current episode mixed, severe, without psychotic features: Secondary | ICD-10-CM

## 2018-09-16 DIAGNOSIS — F909 Attention-deficit hyperactivity disorder, unspecified type: Secondary | ICD-10-CM | POA: Diagnosis present

## 2018-09-16 DIAGNOSIS — R45851 Suicidal ideations: Secondary | ICD-10-CM | POA: Diagnosis present

## 2018-09-16 DIAGNOSIS — H6091 Unspecified otitis externa, right ear: Secondary | ICD-10-CM | POA: Diagnosis present

## 2018-09-16 DIAGNOSIS — T50901A Poisoning by unspecified drugs, medicaments and biological substances, accidental (unintentional), initial encounter: Secondary | ICD-10-CM

## 2018-09-16 DIAGNOSIS — R0902 Hypoxemia: Secondary | ICD-10-CM | POA: Diagnosis not present

## 2018-09-16 DIAGNOSIS — G4700 Insomnia, unspecified: Secondary | ICD-10-CM | POA: Diagnosis present

## 2018-09-16 DIAGNOSIS — Z7984 Long term (current) use of oral hypoglycemic drugs: Secondary | ICD-10-CM

## 2018-09-16 DIAGNOSIS — T465X4A Poisoning by other antihypertensive drugs, undetermined, initial encounter: Secondary | ICD-10-CM

## 2018-09-16 HISTORY — DX: Type 2 diabetes mellitus without complications: E11.9

## 2018-09-16 LAB — BLOOD GAS, ARTERIAL
Acid-Base Excess: 4.7 mmol/L — ABNORMAL HIGH (ref 0.0–2.0)
Bicarbonate: 28.6 mmol/L — ABNORMAL HIGH (ref 20.0–28.0)
Delivery systems: POSITIVE
Drawn by: 362771
Expiratory PAP: 6
FIO2: 40
Inspiratory PAP: 16
Mode: POSITIVE
O2 Saturation: 97.5 %
Patient temperature: 98.6
pCO2 arterial: 41.4 mmHg (ref 32.0–48.0)
pH, Arterial: 7.454 — ABNORMAL HIGH (ref 7.350–7.450)
pO2, Arterial: 95.5 mmHg (ref 83.0–108.0)

## 2018-09-16 LAB — CBC
HCT: 48.3 % (ref 39.0–52.0)
Hemoglobin: 16.5 g/dL (ref 13.0–17.0)
MCH: 30 pg (ref 26.0–34.0)
MCHC: 34.2 g/dL (ref 30.0–36.0)
MCV: 87.8 fL (ref 80.0–100.0)
Platelets: 256 10*3/uL (ref 150–400)
RBC: 5.5 MIL/uL (ref 4.22–5.81)
RDW: 12.9 % (ref 11.5–15.5)
WBC: 12.9 10*3/uL — ABNORMAL HIGH (ref 4.0–10.5)
nRBC: 0 % (ref 0.0–0.2)

## 2018-09-16 LAB — COMPREHENSIVE METABOLIC PANEL
ALT: 19 U/L (ref 0–44)
AST: 25 U/L (ref 15–41)
Albumin: 3.9 g/dL (ref 3.5–5.0)
Alkaline Phosphatase: 58 U/L (ref 38–126)
Anion gap: 12 (ref 5–15)
BUN: 12 mg/dL (ref 6–20)
CO2: 25 mmol/L (ref 22–32)
Calcium: 9.3 mg/dL (ref 8.9–10.3)
Chloride: 99 mmol/L (ref 98–111)
Creatinine, Ser: 1 mg/dL (ref 0.61–1.24)
GFR calc Af Amer: >60 mL/min (ref >60)
GFR calc non Af Amer: >60 mL/min (ref >60)
Glucose, Bld: 204 mg/dL — ABNORMAL HIGH (ref 70–99)
Potassium: 3.9 mmol/L (ref 3.5–5.1)
Sodium: 136 mmol/L (ref 135–145)
Total Bilirubin: 1.3 mg/dL — ABNORMAL HIGH (ref 0.3–1.2)
Total Protein: 8.2 g/dL — ABNORMAL HIGH (ref 6.5–8.1)

## 2018-09-16 LAB — CBG MONITORING, ED
Glucose-Capillary: 245 mg/dL — ABNORMAL HIGH (ref 70–99)
Glucose-Capillary: 175 mg/dL — ABNORMAL HIGH (ref 70–99)

## 2018-09-16 LAB — RAPID URINE DRUG SCREEN, HOSP PERFORMED
Amphetamines: NOT DETECTED
Barbiturates: NOT DETECTED
Benzodiazepines: POSITIVE — AB
Cocaine: NOT DETECTED
Opiates: NOT DETECTED
Tetrahydrocannabinol: NOT DETECTED

## 2018-09-16 LAB — GLUCOSE, CAPILLARY: Glucose-Capillary: 151 mg/dL — ABNORMAL HIGH (ref 70–99)

## 2018-09-16 LAB — SARS CORONAVIRUS 2 BY RT PCR (HOSPITAL ORDER, PERFORMED IN ~~LOC~~ HOSPITAL LAB): SARS Coronavirus 2: NEGATIVE

## 2018-09-16 LAB — ACETAMINOPHEN LEVEL: Acetaminophen (Tylenol), Serum: <10 ug/mL — ABNORMAL LOW (ref 10–30)

## 2018-09-16 LAB — ETHANOL: Alcohol, Ethyl (B): <10 mg/dL (ref <10)

## 2018-09-16 LAB — SALICYLATE LEVEL
Salicylate Lvl: <7 mg/dL (ref 2.8–30.0)
Salicylate Lvl: <7 mg/dL (ref 2.8–30.0)

## 2018-09-16 MED ORDER — NALOXONE HCL 0.4 MG/ML IJ SOLN
INTRAMUSCULAR | Status: AC
  Administered 2018-09-16: 12:00:00
  Filled 2018-09-16: qty 1

## 2018-09-16 MED ORDER — INSULIN ASPART 100 UNIT/ML ~~LOC~~ SOLN
0.0000 [IU] | Freq: Three times a day (TID) | SUBCUTANEOUS | Status: DC
  Administered 2018-09-17 (×2): 3 [IU] via SUBCUTANEOUS
  Administered 2018-09-18: 5 [IU] via SUBCUTANEOUS
  Administered 2018-09-18 – 2018-09-19 (×3): 2 [IU] via SUBCUTANEOUS
  Administered 2018-09-20 – 2018-09-21 (×2): 3 [IU] via SUBCUTANEOUS

## 2018-09-16 MED ORDER — PROPOFOL 1000 MG/100ML IV EMUL
INTRAVENOUS | Status: AC
  Filled 2018-09-16: qty 100

## 2018-09-16 MED ORDER — INSULIN ASPART 100 UNIT/ML ~~LOC~~ SOLN
0.0000 [IU] | Freq: Every day | SUBCUTANEOUS | Status: DC
  Administered 2018-09-17: 2 [IU] via SUBCUTANEOUS

## 2018-09-16 MED ORDER — ENOXAPARIN SODIUM 40 MG/0.4ML ~~LOC~~ SOLN
40.0000 mg | SUBCUTANEOUS | Status: DC
  Administered 2018-09-16: 40 mg via SUBCUTANEOUS
  Filled 2018-09-16: qty 0.4

## 2018-09-16 MED ORDER — SODIUM CHLORIDE 0.9% FLUSH
3.0000 mL | Freq: Two times a day (BID) | INTRAVENOUS | Status: DC
  Administered 2018-09-16 – 2018-09-19 (×3): 3 mL via INTRAVENOUS

## 2018-09-16 MED ORDER — ATROPINE SULFATE 1 MG/10ML IJ SOSY: 0.5000 mg | PREFILLED_SYRINGE | Freq: Four times a day (QID) | INTRAMUSCULAR | Status: DC | PRN

## 2018-09-16 MED ORDER — NALOXONE HCL 0.4 MG/ML IJ SOLN: 0.4000 mg | INTRAMUSCULAR | Status: DC | PRN

## 2018-09-16 NOTE — ED Provider Notes (Signed)
[facility] EMERGENCY [facility]PART[facility] Provider Note   CSN: [id number] Arrival date & time: 09/16/18  1136     History   Chief Complaint Chief Complaint  Patient presents with  . Drug Overdose    HPI Bryan Gallegos is a 43 y.o. male.  Level 5 caveat secondary to altered mental status.  Patient brought in by EMS after an overdose of possibly 40 0.2 mg clonidine.  Patient denies that it was in an attempt to kill himself.  Unclear how EMS was alerted.  They state that he is becoming increasingly more somnolent and he was bradycardia in the 30s to which she was responsive to 1 mg of atropine.  I/O in left lower leg.  No other history is available.     The history is provided by the EMS personnel and the patient.  Drug Overdose This is a new problem. The current episode started 1 to 2 hours ago. The problem occurs constantly. The problem has been gradually worsening. Nothing aggravates the symptoms. Nothing relieves the symptoms. He has tried nothing for the symptoms.    No past medical history on file.  There are no active problems to display for this patient.   History reviewed. No pertinent surgical history.      Home Medications    Prior to Admission medications   Not on File    Family History No family history on file.  Social History Social History   Tobacco Use  . Smoking status: Not on file  Substance Use Topics  . Alcohol use: Not on file  . Drug use: Not on file     Allergies   Patient has no allergy information on record.   Review of Systems Review of Systems  Unable to perform ROS: Mental status change     Physical Exam Updated Vital Signs BP 113/72 (BP Location: Left Arm)   Pulse (!) 44   Temp 98.2 F (36.8 C) (Axillary)   Resp 19   Ht 6\' 3"  (1.905 m)   Wt (!) 165 kg   SpO2 98%   BMI 45.47 kg/m   Physical Exam Vitals signs and nursing note reviewed.  Constitutional:      Appearance: He is well-developed. He is  obese.  HENT:     Head: Normocephalic and atraumatic.  Eyes:     Conjunctiva/sclera: Conjunctivae normal.     Comments: Pupils 2 mm and symmetric.  Neck:     Musculoskeletal: Neck supple.  Cardiovascular:     Rate and Rhythm: Normal rate and regular rhythm.     Pulses: Normal pulses.     Heart sounds: No murmur.  Pulmonary:     Effort: Pulmonary effort is normal. No respiratory distress.     Breath sounds: Normal breath sounds.  Abdominal:     Palpations: Abdomen is soft.     Tenderness: There is no abdominal tenderness.  Musculoskeletal: Normal range of motion.        General: No tenderness or deformity.     Comments: Patient is some mottling of his skin on his lower extremities.  There are multiple upper extremity scratch marks.  Skin:    General: Skin is warm and dry.     Capillary Refill: Capillary refill takes less than 2 seconds.  Neurological:     Comments: Patient is unresponsive but will arouse to painful stimuli.  He is moving all 4 extremities.      ED Treatments / Results  Labs (all labs  ordered are listed, but only abnormal results are displayed) Labs Reviewed  COMPREHENSIVE METABOLIC PANEL - Abnormal; Notable for the following components:      Result Value   Glucose, Bld 204 (*)    Total Protein 8.2 (*)    Total Bilirubin 1.3 (*)    All other components within normal limits  ACETAMINOPHEN LEVEL - Abnormal; Notable for the following components:   Acetaminophen (Tylenol), Serum <10 (*)    All other components within normal limits  CBC - Abnormal; Notable for the following components:   WBC 12.9 (*)    All other components within normal limits  RAPID URINE DRUG SCREEN, HOSP PERFORMED - Abnormal; Notable for the following components:   Benzodiazepines POSITIVE (*)    All other components within normal limits  BLOOD GAS, ARTERIAL - Abnormal; Notable for the following components:   pH, Arterial 7.454 (*)    Bicarbonate 28.6 (*)    Acid-Base Excess 4.7 (*)     All other components within normal limits  GLUCOSE, CAPILLARY - Abnormal; Notable for the following components:   Glucose-Capillary 151 (*)    All other components within normal limits  CBG MONITORING, ED - Abnormal; Notable for the following components:   Glucose-Capillary 245 (*)    All other components within normal limits  CBG MONITORING, ED - Abnormal; Notable for the following components:   Glucose-Capillary 175 (*)    All other components within normal limits  SARS CORONAVIRUS 2 (HOSPITAL ORDER, [facility])  ETHANOL  SALICYLATE LEVEL  SALICYLATE LEVEL  HIV ANTIBODY (ROUTINE TESTING W REFLEX)  COMPREHENSIVE METABOLIC PANEL  CBC    EKG EKG Interpretation  Date/Time:  Saturday September 16 2018 11:36:22 EDT Ventricular Rate:  66 PR Interval:    QRS Duration: 95 QT Interval:  404 QTC Calculation: 424 R Axis:   91 Text Interpretation:  Sinus rhythm Consider left atrial enlargement Right axis deviation no prior to compare with Confirmed by Aletta Edouard [phone]) on 09/16/2018 12:08:22 PM   Radiology Dg Chest Port 1 View  Result Date: 09/16/2018 CLINICAL DATA:  Altered mental status, overdose EXAM: PORTABLE CHEST 1 VIEW COMPARISON:  03/14/2018 FINDINGS: Low volume AP portable examination with mild, diffuse interstitial pulmonary opacity, which may reflect bronchovascular crowding and/or mild edema. No focal airspace opacity. The heart and mediastinum are unremarkable. IMPRESSION: Low volume AP portable examination with mild, diffuse interstitial pulmonary opacity, which may reflect bronchovascular crowding and/or mild edema. No focal airspace opacity. The heart and mediastinum are unremarkable. Electronically Signed   By: Eddie Candle M.D.   On: 09/16/2018 12:38    Procedures .Critical Care Performed by: Hayden Rasmussen, MD Authorized by: Hayden Rasmussen, MD   Critical care provider statement:    Critical care time (minutes):  45   Critical  care time was exclusive of:  Separately billable procedures and treating other patients   Critical care was necessary to treat or prevent imminent or life-threatening deterioration of the following conditions:  CNS failure or compromise   Critical care was time spent personally by me on the following activities:  Evaluation of patient's response to treatment, examination of patient, ordering and performing treatments and interventions, ordering and review of laboratory studies, ordering and review of radiographic studies, pulse oximetry, re-evaluation of patient's condition, obtaining history from patient or surrogate, review of old charts and development of treatment plan with patient or surrogate   I assumed direction of critical care for this patient from  another provider in my specialty: no     (including critical care time)  Medications Ordered in ED Medications  propofol (DIPRIVAN) 1000 MG/100ML infusion (has no administration in time range)  naloxone (NARCAN) 0.4 MG/ML injection (has no administration in time range)     Initial Impression / Assessment and Plan / ED Course  I have reviewed the triage vital signs and the nursing notes.  Pertinent labs & imaging results that were available during my care of the patient were reviewed by me and considered in my medical decision making (see chart for details).  Clinical Course as of Sep 17 555  Sat Sep 16, 2018  1212 Patient with an IO for access and otherwise very poor peripheral access.  He had multiple attempts by myself and nurses and ultimately we have an IV in his left upper arm.  We had prepared to intubate him although he has a very large tongue short neck and morbid obesity.  Prior to giving him paralytics he became verbal and was able to say his name and what he took.  He received 0.4 mg of Narcan with some improvement in his mental status.  He will need close watching for a decline in his mental status and the need for intubation.    [MB]  1223 Patient definitely seems to be moving around the battle a bit more since the Narcan.   [MB]  1250 Poison control is recommending a 6-hour observation on a cardiac monitor.   [MB]  1433 Patient's heart rate is mostly in the 50s and will drop into the high 40s at times.  His blood pressure still been normal to normal high.  Mental status is improved from initial where he was very unresponsive and now is arousable to stimuli.  I placed him on an IVC even though it is not clear that he took these in an attempt to hurt himself he clearly demonstrated some poor judgment.   [MB]    Clinical Course User Index [MB] Terrilee FilesButler, Donica Derouin C, MD        Final Clinical Impressions(s) / ED Diagnoses   Final diagnoses:  Clonidine overdose, undetermined intent, initial encounter    ED Discharge Orders    None       Terrilee FilesButler, Vardaan Depascale C, MD 09/17/18 0600

## 2018-09-16 NOTE — H&P (Signed)
Date: 09/16/2018               Patient Name:  Bryan Gallegos MRN: [id number]  DOB: 01/13/1976 Age / Sex: 43 y.o., male   PCP: Patient, No Pcp Per         Medical Service: Internal Medicine Teaching Service         Attending Physician: Dr. Inez CatalinaMullen, Emily B, MD    First Contact: Genene Churnarien Bryan Gallegos, MS4 Pager: [phone]  Second Contact: Dr. Evelene CroonSantos Pager: [phone]       After Hours (After 5p/  First Contact Pager: [phone]  weekends / holidays): Second Contact Pager: [phone]   Chief Complaint: Medication overose  History of Present Illness:  Bryan Gallegos is a 43 year old man with a past medical history of diabetes, hypertension, anxiety, ADHD, bipolar disorder, drug overdose with previous hospitalization 2 months ago for trazodone overdose, who presented to the ED after ingesting 30 to 40 pills of his home medication.  Reports that he accidentally swapped his pill bottles at home which led to him taking his home clonidine.  States that he meant to take 30 to 40 tablets of his baby aspirin which he says he does once a month.  He has taken the wrong medication in the past before and denies intentional ingestion or self-harm.  After realizing he took the wrong medication he dialed emergency services for help.  He regularly takes his psych medications and does not endorse issues with managing his prescriptions.  His son who he lives with was not home at the time of ingestion and did not return until EMS had already arrived.  Denies any ingestion of other substances at that time.  He has not had any nausea or vomiting.  Other than his chronic back pain he denies chest pain or abdominal pain.  Reports some right ear pain that was recently seen in outpatient.  Does endorse some blurry vision and watery eyes.  He has not had any changes in mood, appetite, psychomotor agitation, with suicidal thoughts.  He has not had any fatigue and feels as though he has been more energetic recently.  He also  reports an intentional weight loss of 60 pounds over the past 3 months.  In the ED: On arrival he was bradycardic at 30 with a blood pressure of 166/115 and was given 1 dose of atropine and Narcan.  Due to difficulty with line placement, an IO was placed in his left leg.  Initial white count at 12.9 with hemoglobin stable at 16.5.  Glucose of 245.  Ethanol, salicylate, acetaminophen level normal.  Chest x-ray with mild, diffuse interstitial pulmonary opacity, without remarkable findings in the heart and mediastinum.  Meds: Current Meds  Medication Sig   amphetamine-dextroamphetamine (ADDERALL) 30 MG tablet Take 30 mg by mouth daily.   atorvastatin (LIPITOR) 20 MG tablet Take 20 mg by mouth daily.   clonazePAM (KLONOPIN) 1 MG tablet Take 1 mg by mouth 2 (two) times daily.   cloNIDine (CATAPRES) 0.2 MG tablet Take 0.2 mg by mouth 2 (two) times daily.   gabapentin (NEURONTIN) 800 MG tablet Take 800 mg by mouth 3 (three) times daily.   hydrochlorothiazide (HYDRODIURIL) 25 MG tablet Take 25 mg by mouth daily.   hydrOXYzine (VISTARIL) 50 MG capsule Take 50 mg by mouth 2 (two) times daily as needed for anxiety.   metFORMIN (GLUCOPHAGE) 1000 MG tablet Take 1,000 mg by mouth daily with breakfast.   omeprazole (PRILOSEC) 40 MG capsule  Take 40 mg by mouth 2 (two) times daily.   OXYCODONE HCL PO Take 15 mg by mouth every 6 (six) hours as needed (pain).   sitaGLIPtin (JANUVIA) 100 MG tablet Take 100 mg by mouth daily.   tiZANidine (ZANAFLEX) 4 MG tablet Take 4 mg by mouth every 8 (eight) hours as needed for muscle spasms.   traZODone (DESYREL) 150 MG tablet Take 300 mg by mouth at bedtime.   ziprasidone (GEODON) 40 MG capsule Take 40 mg by mouth 2 (two) times daily with a meal.     Allergies: Allergies as of 09/16/2018   (Not on File)   No past medical history on file.  Family History: Father with alcohol use, mother and sister have history of depression, no history of suicides in  family.  Social History: He is currently living at home with his son.  Denies any issues with stress resulting from his current living situation.  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  His wife passed away 2 months ago around the time of his last hospitalization for medication overdose.  Their anniversary was 1 week ago and he has been doing his best to cope.  He had a visit with his psychiatrist last week in anticipation of needing additional support.  Currently smoking half pack cigarettes a day denies any alcohol consumption.  Review of Systems: A complete ROS was negative except as per HPI  Physical Exam: Blood pressure 126/71, pulse (!) 46, temperature (!) 96.7 F (35.9 C), temperature source Tympanic, resp. rate 19, SpO2 99 %.  Physical Exam  Constitutional: Laying in bed uncomfortable appearing, diaphoretic, on nasal cannula Head: Normocephalic and atraumatic.  Ears: Normal tympanic membrane without discharge or redness Throat: Nonerythematous Eyes: Pinpoint pupils, watery eyes without conjunctival redness Cardiovascular: Bradycardic, no murmur appreciated, peripheral pulses intact Respiratory: Increased work of breathing on nasal cannula, respiratory expansion limited likely by body habitus.  Clear to auscultation on front chest wall, unable to auscultate the back due to patient positioning GI: Soft. Bowel sounds are normal. There is no tenderness.  Musculoskeletal: No edema.  Neurological: Is alert.  Aside from blurry vision remaining cranial nerves intact, strength grossly intact throughout Skin: Diaphoretic, bilateral upper extremities cool to touch compared to lower extremities, multiple scratches on bilateral forearms that patient obtained while he was removing the wooden door during construction work at home Psychiatric: Normal mood and affect. Behavior is normal. Judgment and thought content normal.   EKG:  Sinus bradycardia  CXR: Unremarkable heart and mediastinal findings.  Diffuse interstitial pulmonary opacities present.  Assessment & Plan by Problem:  Bryan Gallegos is a 43 year old man with a past medical history of diabetes, hypertension, anxiety, ADHD, bipolar disorder, who was previously hospitalized 2 months ago for drug overdose. He presents to the ED after ingesting 30 to 40 pills of what is likely his home clonidine.  Active Problems:   Clonidine overdose Clonidine overdose Patient has a past history of overdose with most recent hospitalization 2 months ago after ingesting an increased amount of his home trazodone.  At this time he does not endorse any active suicidality stating that he is religious and has to support his 3 kids after the passing of his wife.  He has continued to be bradycardic but his blood pressure has remained stable and he is currently afebrile.  He has a number of home psych meds which we will hold at this time.  His current persistent bradycardia is likely a result of his clonidine overdose.  Lab work is stable at this time with no concern for infection or hypoglycemia.  We will continue to provide supportive therapy and carefully monitor his vitals. Followed up with poison control who recommended recheck of salicylate due continued diaphoresis and bradycardia. - Recheck salicylate level - Provide fluids as needed - Wean supplemental oxygen as tolerated - CMP in the morning - Follow up EKG q6h   Diabetes: Glucose and electrolytes have remained stable. -Routine CBG with SSI-S   Diet - NPO Fluids - none DVT ppx - Lovenox CODE STATUS - FULL CODE  Dispo: Admit patient to Observation with expected length of stay less than 2 midnights.  Signed: Cristopher EstimableMcNeill, Bryan Gallegos, Medical Student 09/16/2018, 5:14 PM  Pager: [phone]

## 2018-09-16 NOTE — Progress Notes (Signed)
On call notified re: pt HR in 30s, & periods of apena, no new orders at this time.

## 2018-09-16 NOTE — Progress Notes (Signed)
Gabby from ED called and sister had called looking for him.  She did not give any information regarding the patient, but called to see if the patient would allow info to be given.  Patient stated it was ok for her to know he was here, but that was it.  This was relayed to [facility]  RN Chryl Heck aware.

## 2018-09-16 NOTE — ED Notes (Signed)
Spoke to poison control for follow up from EMS contact with [facility]. Recommend to monitor HR and BP, atropine as needed for HR. Monitor for minimum 6 hours.

## 2018-09-16 NOTE — Progress Notes (Signed)
Patient has been placed on BIPAP due periods of apnea, will continue to monitor patient.

## 2018-09-16 NOTE — ED Notes (Signed)
EDP made aware of the pt's pulse: 45bpm.

## 2018-09-16 NOTE — ED Notes (Signed)
Pt now talking, moving in the bed, after narcan. Pt tried to get out of bed.

## 2018-09-16 NOTE — ED Triage Notes (Addendum)
Per EMS- pt took 40 clonidine. Pt has become unresponsive, bradycardic at 30 was given atropine X 1, HR 80s. IO to L leg. Pt unresponsive.

## 2018-09-16 NOTE — Progress Notes (Signed)
Provider in to see pt.

## 2018-09-16 NOTE — ED Provider Notes (Signed)
Patient care signed out at 3 pm to continue to monitor vitals and reassess.  Patient sedate on reassessment, responds to loud verbal stimulation.  BP holding normotensive.  Pt diaphoretic on exam. Bradycardia sinus 40s.  Pt had atropin with ems.   Plan for observation in the hospital on tele/stepdown and TTS assessment for SI afterwards.  IVC filled out by prior physician.  Spoke with IM resident for admission.  Updated patient.  CRITICAL CARE Performed by: Mariea Clonts   Total critical care time: 40 minutes  Critical care time was exclusive of separately billable procedures and treating other patients.  Critical care was necessary to treat or prevent imminent or life-threatening deterioration.  Critical care was time spent personally by me on the following activities: development of treatment plan with patient and/or surrogate as well as nursing, discussions with consultants, evaluation of patient's response to treatment, examination of patient, obtaining history from patient or surrogate, ordering and performing treatments and interventions, ordering and review of laboratory studies, ordering and review of radiographic studies, pulse oximetry and re-evaluation of patient's condition.  Clonidine OD Symptomatic bradycardia    Elnora Morrison, MD 09/18/18 [phone]

## 2018-09-16 NOTE — Progress Notes (Addendum)
Writer notified by Telemetry of pt HR in 30s. Writer observed pt sleeping, noted with periods of apnea. Sternal rubbed patient to wake up, pt HR increased to mid 40s. Charge nurse notified rapid response team and RT. RT in to place pt onto BIPAP.

## 2018-09-16 NOTE — Consult Note (Signed)
NAME:  Bryan Gallegos, MRN:  [id number], DOB:  October 27, 1975, LOS: 0 ADMISSION DATE:  09/16/2018, CONSULTATION DATE:  7/18  CHIEF COMPLAINT:  Clonidine overdose  Brief History   This is a 43 yo male with history of DM, HTN, anxiety, ADHD, and bipolar who presented with  concerns for overdose. Had been noted to take about 40 clonidines.  Had previous overdose incident with trazodone.   History of present illness   This is a 43 yo male with history of DM, HTN, anxiety, ADHD, and bipolar who presented with  concerns for overdose. Had been noted to take about 40 clonidines.  Had previous overdose incident with trazodone. He presented to the ED with heart rate in the 30's BP well preserved at 166/115. Was on the floor. Per staff patient was noted to be bradycardic when she fist spoke with him. However he was alert. However later in the evening when heart rate dropped to 30's he was noted to be more somnolent. Also patient refused to wear bipap. On speaking with patient he is AOx4. He is able to interact normally with minimal stimulation. Notes that he is not waering bipap at this time because pressures are too high and is going into eyes. Furthermore he notes he has been intubated several times in past for elevated CO2. On review of ABG there is no CO2 retention.   Past Medical History  HTN  Anxiety ADHD bipolar  Significant Hospital Events   Admitted on 7/18 for clonidine overdose  Consults:  PCCM  Procedures:  NA  Significant Diagnostic Tests:  None  Micro Data:  None  Antimicrobials:  None  Interim history/subjective:  NA  Objective   Blood pressure (!) 153/81, pulse (!) 44, temperature 98.5 F (36.9 C), temperature source Oral, resp. rate 19, height 6\' 3"  (1.905 m), weight (!) 165 kg, SpO2 98 %.    FiO2 (%):  [40 %] 40 %  No intake or output data in the 24 hours ending 09/16/18 2317 Filed Weights   09/16/18 1917  Weight: (!) 165 kg    Examination: General: Awake and  conversing spontaeously after awoken HENT: There is thick neck Lungs: CTAB. Diminshed Cardiovascular: Bracycardic but regular Abdomen: Soft non tender non distended Extremities: Warm to tocuh. Good pulses Neuro: GCS 15. Nomral menation AOx3   Resolved Hospital Problem list   NA  Assessment & Plan:  This is a 43 year old who presents s/p overdose on clonidine. 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 These pressures will be more tolerable. Would aim to increase to 12/6 tomorrow and consider possible increase from there depending on relevant gases. Not too concerned about keeping on current 16/6 as patient alkalotic with this. Would take stepwise approach as some positive pressure is better than no positive pressure. Also recent BMP without significant elevation in bicarb thus unlikely to have very significant amount of C02 retention. As there is no compensation.    Please call if any further questions PCCM will sign off for now   Labs   CBC: Recent Labs  Lab 09/16/18 1138  WBC 12.9*  HGB 16.5  HCT 48.3  MCV 87.8  PLT 810    Basic Metabolic Panel: Recent Labs  Lab 09/16/18 1138  NA 136  K 3.9  CL 99  CO2 25  GLUCOSE 204*  BUN 12  CREATININE 1.00  CALCIUM 9.3   GFR: Estimated Creatinine Clearance: 155.6 mL/min (by C-G formula based on SCr of 1 mg/dL). Recent Labs  Lab 09/16/18 1138  WBC 12.9*    Liver Function Tests: Recent Labs  Lab 09/16/18 1138  AST 25  ALT 19  ALKPHOS 58  BILITOT 1.3*  PROT 8.2*  ALBUMIN 3.9   No results for input(s): LIPASE, AMYLASE in the last 168 hours. No results for input(s): AMMONIA in the last 168 hours.  ABG    Component Value Date/Time   PHART 7.454  (H) 09/16/2018 2120   PCO2ART 41.4 09/16/2018 2120   PO2ART 95.5 09/16/2018 2120   HCO3 28.6 (H) 09/16/2018 2120   O2SAT 97.5 09/16/2018 2120     Coagulation Profile: No results for input(s): INR, PROTIME in the last 168 hours.  Cardiac Enzymes: No results for input(s): CKTOTAL, CKMB, CKMBINDEX, TROPONINI in the last 168 hours.  HbA1C: No results found for: HGBA1C  CBG: Recent Labs  Lab 09/16/18 1249 09/16/18 1737 09/16/18 2153  GLUCAP 245* 175* 151*    Review of Systems:   Pertinent positive and negatives per HPI  Past Medical History  He,  has no past medical history on file.   Surgical History   None relevant at this time  Social History      Family History   His family history is not on file.   Allergies Not on File   Home Medications  Prior to Admission medications   Medication Sig Start Date End Date Taking? Authorizing Provider  amphetamine-dextroamphetamine (ADDERALL) 30 MG tablet Take 30 mg by mouth daily.   Yes [provider]  atorvastatin (LIPITOR) 20 MG tablet Take 20 mg by mouth daily.   Yes [provider]  clonazePAM (KLONOPIN) 1 MG tablet Take 1 mg by mouth 2 (two) times daily.   Yes [provider]  cloNIDine (CATAPRES) 0.2 MG tablet Take 0.2 mg by mouth 2 (two) times daily.   Yes [provider]  gabapentin (NEURONTIN) 800 MG tablet Take 800 mg by mouth 3 (three) times daily.   Yes [provider]  hydrochlorothiazide (HYDRODIURIL) 25 MG tablet Take 25 mg by mouth daily.   Yes [provider]  hydrOXYzine (VISTARIL) 50 MG capsule Take 50 mg by mouth 2 (two) times daily as needed for anxiety.   Yes [provider]  metFORMIN (GLUCOPHAGE) 1000 MG tablet Take 1,000 mg by mouth daily with breakfast.   Yes [provider]  omeprazole (PRILOSEC) 40 MG capsule Take 40 mg by mouth 2 (two) times daily.   Yes [provider]  OXYCODONE HCL PO Take 15 mg by mouth every 6  (six) hours as needed (pain).   Yes [provider]  sitaGLIPtin (JANUVIA) 100 MG tablet Take 100 mg by mouth daily.   Yes [provider]  tiZANidine (ZANAFLEX) 4 MG tablet Take 4 mg by mouth every 8 (eight) hours as needed for muscle spasms.   Yes [provider]  traZODone (DESYREL) 150 MG tablet Take 300 mg by mouth at bedtime.   Yes [provider]  ziprasidone (GEODON) 40 MG capsule Take 40 mg by mouth 2 (two) times daily with a meal.   Yes [provider]     Critical care time: 5445

## 2018-09-16 NOTE — Progress Notes (Addendum)
43yo male with overdose of 40 tablets clonidine earlier today. Paged for patient having episodes of apnea and bradycardia to the mid 30s. Respiratory called and had placed patient on bipap. At bedside he was not rousable to sternal rub but did withdraw to pain in his extremities. HR 37, O2 99% on bipap. On PE PERRLA, extremities warm. Placed consult to PCCM to see patient.  ABG ordered after being placed on bipap shows mild respiratory alkalosis. After being on bipap for some time patient woke up and states that he does not want to wear bipap. He is oriented to self, place, and time. Per nursing they are concerned that he will become apneic again as soon as he falls asleep, as he has been falling asleep immediately after he stops talking.   - Discussed with PCCM and will continue to monitor - if he continues to have persistent apnea reconsult.  - continue 1:1 monitoring    Seawell, Jaimie A, DO 09/16/2018, 10:11 PM Pager: [phone]

## 2018-09-16 NOTE — Progress Notes (Signed)
Pt has refused cpap at this time stating he can't wear d/t "feeling smothered.' Pt aware he can request cpap later if he changes his mind. RT will continue to monitor.

## 2018-09-16 NOTE — Significant Event (Signed)
Rapid Response Event Note  Overview:Called d/t bradycardia, apnea, and difficulty arousing pt. This is not a change from previous shift except pt is now having apneic episodes. RT on way to bedside to place pt on bipap. Time Called: 2040 Arrival Time: 2100 Event Type: Neurologic, Respiratory, Cardiac  Initial Focused Assessment: On arrival, pt laying in bed with eyes closed. Pt only awakens to deep sternal rub. Pt will follow commands, move all extremities, and answer questions appropriately once awake, however falls back asleep quickly. Pupils 2 and slugglish, downward gaze noted however when awake, no gaze preference present. T-98.5, HR-45, BP-153/81, RR-24, SpO2-96% on .40 Bipap. Lungs clear and diminished, heart sounds WNL, active bowel sounds. Skin warm and dry with noted scratch marks to B UE.   Interventions: Bipap Dr. Sharon Seller to bedside to evaluate pt. ABG-7.45/41.4/95.5/28.6 Plan of Care (if not transferred): Bipap. Continue to monitor pt.  PCCM consulted.  Update: 2145-Pt woke up and pulled off bipap, alert and oriented, refusing to put back on. MD at bedside and okay with leaving off and monitoring. PCCM updated as to situation, will call back if needed.   Event Summary: Name of Physician Notified: Dr. Sharon Seller at (PTA RRT)    at          [facility], Carren Rang

## 2018-09-16 NOTE — ED Notes (Signed)
Poison Control Representative that called about this pt's case was updated, she has also recommended to do a re-draw on the salicylate due to the pt's current diaphoresis & to wean down on the supplemental O2.Marland Kitchen

## 2018-09-17 ENCOUNTER — Encounter (HOSPITAL_COMMUNITY): Payer: Self-pay | Admitting: Emergency Medicine

## 2018-09-17 DIAGNOSIS — Z6841 Body Mass Index (BMI) 40.0 and over, adult: Secondary | ICD-10-CM | POA: Diagnosis not present

## 2018-09-17 DIAGNOSIS — T465X4A Poisoning by other antihypertensive drugs, undetermined, initial encounter: Secondary | ICD-10-CM | POA: Diagnosis not present

## 2018-09-17 DIAGNOSIS — R9431 Abnormal electrocardiogram [ECG] [EKG]: Secondary | ICD-10-CM | POA: Diagnosis present

## 2018-09-17 DIAGNOSIS — F411 Generalized anxiety disorder: Secondary | ICD-10-CM | POA: Diagnosis present

## 2018-09-17 DIAGNOSIS — R0902 Hypoxemia: Secondary | ICD-10-CM | POA: Diagnosis not present

## 2018-09-17 DIAGNOSIS — J45909 Unspecified asthma, uncomplicated: Secondary | ICD-10-CM | POA: Diagnosis present

## 2018-09-17 DIAGNOSIS — Z841 Family history of disorders of kidney and ureter: Secondary | ICD-10-CM | POA: Diagnosis not present

## 2018-09-17 DIAGNOSIS — S0181XA Laceration without foreign body of other part of head, initial encounter: Secondary | ICD-10-CM | POA: Diagnosis not present

## 2018-09-17 DIAGNOSIS — Z87892 Personal history of anaphylaxis: Secondary | ICD-10-CM | POA: Diagnosis not present

## 2018-09-17 DIAGNOSIS — F3163 Bipolar disorder, current episode mixed, severe, without psychotic features: Secondary | ICD-10-CM | POA: Diagnosis present

## 2018-09-17 DIAGNOSIS — Z915 Personal history of self-harm: Secondary | ICD-10-CM | POA: Diagnosis not present

## 2018-09-17 DIAGNOSIS — Z1159 Encounter for screening for other viral diseases: Secondary | ICD-10-CM | POA: Diagnosis not present

## 2018-09-17 DIAGNOSIS — E119 Type 2 diabetes mellitus without complications: Secondary | ICD-10-CM | POA: Diagnosis present

## 2018-09-17 DIAGNOSIS — R001 Bradycardia, unspecified: Secondary | ICD-10-CM | POA: Diagnosis present

## 2018-09-17 DIAGNOSIS — T1491XA Suicide attempt, initial encounter: Secondary | ICD-10-CM | POA: Diagnosis not present

## 2018-09-17 DIAGNOSIS — M549 Dorsalgia, unspecified: Secondary | ICD-10-CM | POA: Diagnosis present

## 2018-09-17 DIAGNOSIS — H6091 Unspecified otitis externa, right ear: Secondary | ICD-10-CM | POA: Diagnosis present

## 2018-09-17 DIAGNOSIS — I10 Essential (primary) hypertension: Secondary | ICD-10-CM | POA: Diagnosis present

## 2018-09-17 DIAGNOSIS — Z823 Family history of stroke: Secondary | ICD-10-CM | POA: Diagnosis not present

## 2018-09-17 DIAGNOSIS — F319 Bipolar disorder, unspecified: Secondary | ICD-10-CM | POA: Diagnosis not present

## 2018-09-17 DIAGNOSIS — W2209XA Striking against other stationary object, initial encounter: Secondary | ICD-10-CM | POA: Diagnosis not present

## 2018-09-17 DIAGNOSIS — T50902A Poisoning by unspecified drugs, medicaments and biological substances, intentional self-harm, initial encounter: Secondary | ICD-10-CM | POA: Diagnosis not present

## 2018-09-17 DIAGNOSIS — T465X2A Poisoning by other antihypertensive drugs, intentional self-harm, initial encounter: Secondary | ICD-10-CM | POA: Diagnosis present

## 2018-09-17 DIAGNOSIS — Z833 Family history of diabetes mellitus: Secondary | ICD-10-CM | POA: Diagnosis not present

## 2018-09-17 DIAGNOSIS — Z8249 Family history of ischemic heart disease and other diseases of the circulatory system: Secondary | ICD-10-CM | POA: Diagnosis not present

## 2018-09-17 DIAGNOSIS — R451 Restlessness and agitation: Secondary | ICD-10-CM | POA: Diagnosis not present

## 2018-09-17 DIAGNOSIS — S0081XA Abrasion of other part of head, initial encounter: Secondary | ICD-10-CM | POA: Diagnosis not present

## 2018-09-17 DIAGNOSIS — R0789 Other chest pain: Secondary | ICD-10-CM | POA: Diagnosis not present

## 2018-09-17 DIAGNOSIS — F419 Anxiety disorder, unspecified: Secondary | ICD-10-CM

## 2018-09-17 DIAGNOSIS — E662 Morbid (severe) obesity with alveolar hypoventilation: Secondary | ICD-10-CM | POA: Diagnosis present

## 2018-09-17 DIAGNOSIS — E873 Alkalosis: Secondary | ICD-10-CM | POA: Diagnosis present

## 2018-09-17 DIAGNOSIS — E785 Hyperlipidemia, unspecified: Secondary | ICD-10-CM | POA: Diagnosis present

## 2018-09-17 DIAGNOSIS — F909 Attention-deficit hyperactivity disorder, unspecified type: Secondary | ICD-10-CM

## 2018-09-17 DIAGNOSIS — Z818 Family history of other mental and behavioral disorders: Secondary | ICD-10-CM | POA: Diagnosis not present

## 2018-09-17 DIAGNOSIS — F609 Personality disorder, unspecified: Secondary | ICD-10-CM | POA: Diagnosis present

## 2018-09-17 DIAGNOSIS — Z7984 Long term (current) use of oral hypoglycemic drugs: Secondary | ICD-10-CM | POA: Diagnosis not present

## 2018-09-17 DIAGNOSIS — G4700 Insomnia, unspecified: Secondary | ICD-10-CM | POA: Diagnosis present

## 2018-09-17 DIAGNOSIS — F17210 Nicotine dependence, cigarettes, uncomplicated: Secondary | ICD-10-CM | POA: Diagnosis present

## 2018-09-17 DIAGNOSIS — T50904D Poisoning by unspecified drugs, medicaments and biological substances, undetermined, subsequent encounter: Secondary | ICD-10-CM | POA: Diagnosis not present

## 2018-09-17 DIAGNOSIS — Z811 Family history of alcohol abuse and dependence: Secondary | ICD-10-CM | POA: Diagnosis not present

## 2018-09-17 DIAGNOSIS — Y92230 Patient room in hospital as the place of occurrence of the external cause: Secondary | ICD-10-CM | POA: Diagnosis not present

## 2018-09-17 DIAGNOSIS — Z79891 Long term (current) use of opiate analgesic: Secondary | ICD-10-CM | POA: Diagnosis not present

## 2018-09-17 DIAGNOSIS — X838XXA Intentional self-harm by other specified means, initial encounter: Secondary | ICD-10-CM | POA: Diagnosis not present

## 2018-09-17 DIAGNOSIS — R45851 Suicidal ideations: Secondary | ICD-10-CM | POA: Diagnosis present

## 2018-09-17 DIAGNOSIS — G8929 Other chronic pain: Secondary | ICD-10-CM | POA: Diagnosis present

## 2018-09-17 DIAGNOSIS — Z8673 Personal history of transient ischemic attack (TIA), and cerebral infarction without residual deficits: Secondary | ICD-10-CM | POA: Diagnosis not present

## 2018-09-17 LAB — GLUCOSE, CAPILLARY
Glucose-Capillary: 158 mg/dL — ABNORMAL HIGH (ref 70–99)
Glucose-Capillary: 117 mg/dL — ABNORMAL HIGH (ref 70–99)
Glucose-Capillary: 181 mg/dL — ABNORMAL HIGH (ref 70–99)
Glucose-Capillary: 250 mg/dL — ABNORMAL HIGH (ref 70–99)

## 2018-09-17 LAB — CBC
HCT: 51.9 % (ref 39.0–52.0)
Hemoglobin: 17.2 g/dL — ABNORMAL HIGH (ref 13.0–17.0)
MCH: 30.5 pg (ref 26.0–34.0)
MCHC: 33.1 g/dL (ref 30.0–36.0)
MCV: 92 fL (ref 80.0–100.0)
Platelets: 229 10*3/uL (ref 150–400)
RBC: 5.64 MIL/uL (ref 4.22–5.81)
RDW: 13.2 % (ref 11.5–15.5)
WBC: 10.7 10*3/uL — ABNORMAL HIGH (ref 4.0–10.5)
nRBC: 0 % (ref 0.0–0.2)

## 2018-09-17 LAB — COMPREHENSIVE METABOLIC PANEL
ALT: 19 U/L (ref 0–44)
AST: 27 U/L (ref 15–41)
Albumin: 3.6 g/dL (ref 3.5–5.0)
Alkaline Phosphatase: 52 U/L (ref 38–126)
Anion gap: 12 (ref 5–15)
BUN: 17 mg/dL (ref 6–20)
CO2: 25 mmol/L (ref 22–32)
Calcium: 9.4 mg/dL (ref 8.9–10.3)
Chloride: 101 mmol/L (ref 98–111)
Creatinine, Ser: 1.02 mg/dL (ref 0.61–1.24)
GFR calc Af Amer: >60 mL/min (ref >60)
GFR calc non Af Amer: >60 mL/min (ref >60)
Glucose, Bld: 146 mg/dL — ABNORMAL HIGH (ref 70–99)
Potassium: 4.8 mmol/L (ref 3.5–5.1)
Sodium: 138 mmol/L (ref 135–145)
Total Bilirubin: 1.7 mg/dL — ABNORMAL HIGH (ref 0.3–1.2)
Total Protein: 8.1 g/dL (ref 6.5–8.1)

## 2018-09-17 LAB — HIV ANTIBODY (ROUTINE TESTING W REFLEX): HIV Screen 4th Generation wRfx: NONREACTIVE

## 2018-09-17 MED ORDER — HYDROXYZINE HCL 25 MG PO TABS: 25.0000 mg | ORAL_TABLET | Freq: Two times a day (BID) | ORAL | Status: DC | PRN

## 2018-09-17 MED ORDER — LORAZEPAM 1 MG PO TABS: 1.0000 mg | ORAL_TABLET | Freq: Four times a day (QID) | ORAL | Status: DC | PRN

## 2018-09-17 MED ORDER — GABAPENTIN 400 MG PO CAPS: 400.0000 mg | ORAL_CAPSULE | Freq: Three times a day (TID) | ORAL | Status: DC

## 2018-09-17 MED ORDER — HYDROXYZINE PAMOATE 25 MG PO CAPS
25.0000 mg | ORAL_CAPSULE | Freq: Two times a day (BID) | ORAL | Status: DC | PRN
  Filled 2018-09-17: qty 1

## 2018-09-17 MED ORDER — LORAZEPAM 0.5 MG PO TABS
0.5000 mg | ORAL_TABLET | Freq: Once | ORAL | Status: AC
  Administered 2018-09-17: 0.5 mg via ORAL
  Filled 2018-09-17: qty 1

## 2018-09-17 MED ORDER — OXYCODONE HCL 5 MG PO TABS
7.5000 mg | ORAL_TABLET | Freq: Four times a day (QID) | ORAL | Status: DC | PRN
  Administered 2018-09-17 – 2018-09-18 (×4): 7.5 mg via ORAL
  Filled 2018-09-17 (×4): qty 2

## 2018-09-17 MED ORDER — RAMELTEON 8 MG PO TABS
8.0000 mg | ORAL_TABLET | Freq: Once | ORAL | Status: AC
  Administered 2018-09-17: 8 mg via ORAL
  Filled 2018-09-17: qty 1

## 2018-09-17 MED ORDER — HYDROXYZINE HCL 25 MG PO TABS
25.0000 mg | ORAL_TABLET | Freq: Three times a day (TID) | ORAL | Status: DC | PRN
  Administered 2018-09-17 – 2018-09-21 (×6): 25 mg via ORAL
  Filled 2018-09-17 (×6): qty 1

## 2018-09-17 MED ORDER — GABAPENTIN 100 MG PO CAPS
200.0000 mg | ORAL_CAPSULE | Freq: Three times a day (TID) | ORAL | Status: DC
  Administered 2018-09-17 – 2018-09-21 (×13): 200 mg via ORAL
  Filled 2018-09-17 (×13): qty 2

## 2018-09-17 MED ORDER — ZIPRASIDONE HCL 40 MG PO CAPS
40.0000 mg | ORAL_CAPSULE | Freq: Two times a day (BID) | ORAL | Status: DC
  Filled 2018-09-17: qty 1

## 2018-09-17 MED ORDER — ENOXAPARIN SODIUM 80 MG/0.8ML ~~LOC~~ SOLN
80.0000 mg | SUBCUTANEOUS | Status: DC
  Administered 2018-09-17 – 2018-09-20 (×3): 80 mg via SUBCUTANEOUS
  Filled 2018-09-17 (×3): qty 0.8
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
## 2018-09-17 NOTE — Progress Notes (Signed)
Patient is out of bed and banging his head against the closet door. He has done this twice. Attempted to speak to patient and understand why he was doing this- refuses to speak to staff. Patient yelling into hallway. Attempts at de escalation unsuccessful. Security called and are at bedside.  Medical team contacted- awaiting on response. Team needs to discuss patient before making any recommendations.

## 2018-09-17 NOTE — Progress Notes (Signed)
Updated Poison Control re: Pt status.

## 2018-09-17 NOTE — Progress Notes (Signed)
Went to patient's room to speak with him as requested. Patient expressed anger and frustration at being IVC'd, stating that he is not suicidal and that he took the clonopin by mistake and called 911 when her realizes it. 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  He also stated that he has a history with the psychiatrist who saw him earlier and recommended IVC. He feels like they do not get along personally. He was informed that we could try to repeat the consult in the morning with a different psychiatrist to re-evaluate how he is doing tomorrow. He appear to agree with this idea and to try not to worry about the IVC label as he would need to be here anyway for medical reasons.  We also discussed why we are holding his medications. This is because many of them prolong his QT. We discussed possibly starting low dose ativan to help him relax given that his QTc is slowly improving.   We discussed his need to resume telemetry, he said he would consider it, but he is not there yet. Telemetry will continue to be offered and will be restarted when patient is agreeable.  - Consider repeat psych eval in the AM - Consider low dose ativan  Pearson Grippe, DO IM PGY-3

## 2018-09-17 NOTE — Progress Notes (Signed)
Patient has pulled out his IV and is refusing to put his heart monitor in place. Requesting to speak to the medical team. MD has been paged and awaiting a call back.

## 2018-09-17 NOTE — Progress Notes (Signed)
Patient refusing telemetry monitoring. Notified MD. Will d/c order at this time.  Will continue to ask patient if we can apply the telemetry leads, and if he is agreeable will place telemetry order back in. Patient agreeable to EKG every couple of hours for now so we can continue to monitor HR and rhythm.

## 2018-09-17 NOTE — Progress Notes (Signed)
  Date: 10-15-2018  Patient name: Bryan Gallegos  Medical record number: [id number]  Date of birth: [birth year]   I have seen and evaluated Bryan Gallegos and discussed their care with the Residency Team. Briefly, Bryan Gallegos is a 43 year old man with [place] of DM2, HTN, anxiety, ADHD, bipolar disorder and history of suicide attempts with OD in the past.  Last 2 months ago due to trazodone.  He reports that he accidentally took 40 tablets of 0.2mg  of clonidine.  He meant to take aspirin, but he was tearful and got confused.  He was bradycardic.  Poison control was contacted and we are monitoring him until his HR is > 60.    PMHx, Fam Hx, and/or Soc Hx : He had an admission for intentional OD of trazodone in May.  He reported it was around the time his wife passed away  Vitals:   15-Oct-2018 0700 10/15/18 0821  BP:  119/62  Pulse: (!) 47 (!) 43  Resp: 19 16  Temp:    SpO2: 95% 96%   General: Patient is awake, alert, he is lying in bed Eyes: Anicteric sclerae CV: Bradycardic, regular rhythm, no murmur Pulm: Breathing comfortably, no wheezing Abd: Obese, NT, +BS Skin: Multiple linear shallow cuts on bilateral arms, healing.   EKG: sinus bradycardia  Assessment and Plan: I have seen and evaluated the patient as outlined above. I agree with the formulated Assessment and Plan as detailed in the residents' note, with the following changes:   1. Clonidine overdose, intentional vs. Accidental - Monitor EKG q 6 hours - Telemetry - Half life of clonidine ranges from 6-24 hours.  He is improving slowly, had some HR in the 30s overnight - Psychiatry referral - Will be okay to discharge to [facility] or other facility once HR remaining above 50-60 - Can consider restarting some of his regular mental health medications.  Avoid trazodone at this time - IVC, 1:1 sitter for suicide attempt  Other issues per resident daily note.   Sid Falcon, MD 08-16-20208:58 AM

## 2018-09-17 NOTE — Consult Note (Signed)
Telepsych Consultation   Reason for Consult: ''suicide attempt, Clonidine overdose'' Referring Physician:  Dr. Criselda Peaches Location of Patient: [facility] Location of Provider: [facility]  Patient Identification: Bryan Gallegos MRN:  [id number] Principal Diagnosis: Clonidine overdose Diagnosis:  Principal Problem:   Clonidine overdose Active Problems:   QT prolongation   Bipolar 1 disorder, mixed, severe (HCC)   Total Time spent with patient: 45 minutes  Subjective:   Bryan Gallegos is a 43 y.o. male patient admitted after he overdosed on Clonidine.  HPI:   Patient with history of DM2, HTN, Bipolar disorder-mixed, generalized anxiety disorder, polysubstance abuse and multiple past history of suicide attempts who was admitted to the hospital after he overdosed on Clonidine.Patient reports that he accidentally took 30-40 tablets of 0.2 mg of Clonidine thinking that the patient was a baby Aspirin. Patient denies attempting to kill himself but he has had many such episodes in the past and gave the same excuse. Patient was discharged from [facility] psychiatric hospital on Friday (09/15/18) where he was hospitalized for 13 days for a similar problem. Patient reports that his mental health has been getting worse since his wife died about 2 years ago. He denies psychosis, delusions but he is irritable and has limited insight into his current problem.   Past Psychiatric History: as above  Risk to Self:   absolutely yes, he is unable to contract for safety Risk to Others:   denies Prior Inpatient Therapy:   multiple, discharged from [facility] on 09/15/18 Prior Outpatient Therapy:   [facility]  Past Medical History: History reviewed. No pertinent past medical history. History reviewed. No pertinent surgical history. Family History: No family history on file. Family Psychiatric  History:  Social History:  Social History   Substance and Sexual Activity   Alcohol Use None     Social History   Substance and Sexual Activity  Drug Use Not on file    Social History   Socioeconomic History  . Marital status: Unknown    Spouse name: Not on file  . Number of children: Not on file  . Years of education: Not on file  . Highest education level: Not on file  Occupational History  . Not on file  Social Needs  . Financial resource strain: Not on file  . Food insecurity    Worry: Not on file    Inability: Not on file  . Transportation needs    Medical: Not on file    Non-medical: Not on file  Tobacco Use  . Smoking status: Not on file  Substance and Sexual Activity  . Alcohol use: Not on file  . Drug use: Not on file  . Sexual activity: Not on file  Lifestyle  . Physical activity    Days per week: Not on file    Minutes per session: Not on file  . Stress: Not on file  Relationships  . Social Musician on phone: Not on file    Gets together: Not on file    Attends religious service: Not on file    Active member of club or organization: Not on file    Attends meetings of clubs or organizations: Not on file    Relationship status: Not on file  Other Topics Concern  . Not on file  Social History Narrative  . Not on file   Additional Social History:    Allergies:   Allergies  Allergen Reactions  . Atenolol  Anaphylaxis  . Lisinopril Anaphylaxis  . Statins Anaphylaxis  . Ibuprofen Itching  . Prednisone Hives  . Rexulti [Brexpiprazole] Swelling  . Tylenol [Acetaminophen]     Labs:  Results for orders placed or performed during the hospital encounter of 09/16/18 (from the past 48 hour(s))  Comprehensive metabolic panel     Status: Abnormal   Collection Time: 09/16/18 11:38 AM  Result Value Ref Range   Sodium 136 135 - 145 mmol/L   Potassium 3.9 3.5 - 5.1 mmol/L   Chloride 99 98 - 111 mmol/L   CO2 25 22 - 32 mmol/L   Glucose, Bld 204 (H) 70 - 99 mg/dL   BUN 12 6 - 20 mg/dL   Creatinine, Ser 1.611.00 0.61 -  1.24 mg/dL   Calcium 9.3 8.9 - 09.610.3 mg/dL   Total Protein 8.2 (H) 6.5 - 8.1 g/dL   Albumin 3.9 3.5 - 5.0 g/dL   AST 25 15 - 41 U/L   ALT 19 0 - 44 U/L   Alkaline Phosphatase 58 38 - 126 U/L   Total Bilirubin 1.3 (H) 0.3 - 1.2 mg/dL   GFR calc non Af Amer >60 >60 mL/min   GFR calc Af Amer >60 >60 mL/min   Anion gap 12 5 - 15    Comment: Performed at [facility], [address] [place], [place] [address]  cbc     Status: Abnormal   Collection Time: 09/16/18 11:38 AM  Result Value Ref Range   WBC 12.9 (H) 4.0 - 10.5 K/uL   RBC 5.50 4.22 - 5.81 MIL/uL   Hemoglobin 16.5 13.0 - 17.0 g/dL   HCT 98.148.3 19.139.0 - 47.852.0 %   MCV 87.8 80.0 - 100.0 fL   MCH 30.0 26.0 - 34.0 pg   MCHC 34.2 30.0 - 36.0 g/dL   RDW 29.512.9 62.111.5 - 30.815.5 %   Platelets 256 150 - 400 K/uL   nRBC 0.0 0.0 - 0.2 %    Comment: Performed at [facility], [address] [place], [place] [address]  Ethanol     Status: None   Collection Time: 09/16/18 11:39 AM  Result Value Ref Range   Alcohol, Ethyl (B) <10 <10 mg/dL    Comment: (NOTE) Lowest detectable limit for serum alcohol is 10 mg/dL. For medical purposes only. Performed at [facility], [address] [place], [place] [address]   Salicylate level     Status: None   Collection Time: 09/16/18 11:39 AM  Result Value Ref Range   Salicylate Lvl <7.0 2.8 - 30.0 mg/dL    Comment: Performed at [facility], [address] [place], [place] [address]  Acetaminophen level     Status: Abnormal   Collection Time: 09/16/18 11:39 AM  Result Value Ref Range   Acetaminophen (Tylenol), Serum <10 (L) 10 - 30 ug/mL    Comment: (NOTE) Therapeutic concentrations vary significantly. A range of 10-30 ug/mL  may be an effective concentration for many patients. However, some  are best treated at concentrations outside of this range. Acetaminophen concentrations >150 ug/mL at 4 hours after ingestion  and >50 ug/mL at 12 hours after ingestion are often associated  with  toxic reactions. Performed at [facility], [address] [place], [place] [address]   CBG monitoring, ED     Status: Abnormal   Collection Time: 09/16/18 12:49 PM  Result Value Ref Range   Glucose-Capillary 245 (H) 70 - 99 mg/dL  SARS Coronavirus 2 (CEPHEID - Performed in [place] hospital lab), Hosp Order     Status: None   Collection Time: 09/16/18  1:00 PM   Specimen: Nasopharyngeal Swab  Result Value Ref Range   SARS Coronavirus 2 NEGATIVE NEGATIVE    Comment: (NOTE) If result is NEGATIVE SARS-CoV-2 target nucleic acids are NOT DETECTED. The SARS-CoV-2 RNA is generally detectable in upper and lower  respiratory specimens during the acute phase of infection. The lowest  concentration of SARS-CoV-2 viral copies this assay can detect is 250  copies / mL. A negative result does not preclude SARS-CoV-2 infection  and should not be used as the sole basis for treatment or other  patient management decisions.  A negative result may occur with  improper specimen collection / handling, submission of specimen other  than nasopharyngeal swab, presence of viral mutation(s) within the  areas targeted by this assay, and inadequate number of viral copies  (<250 copies / mL). A negative result must be combined with clinical  observations, patient history, and epidemiological information. If result is POSITIVE SARS-CoV-2 target nucleic acids are DETECTED. The SARS-CoV-2 RNA is generally detectable in upper and lower  respiratory specimens dur ing the acute phase of infection.  Positive  results are indicative of active infection with SARS-CoV-2.  Clinical  correlation with patient history and other diagnostic information is  necessary to determine patient infection status.  Positive results do  not rule out bacterial infection or co-infection with other viruses. If result is PRESUMPTIVE POSTIVE SARS-CoV-2 nucleic acids MAY BE PRESENT.   A presumptive positive result was  obtained on the submitted specimen  and confirmed on repeat testing.  While 2019 novel coronavirus  (SARS-CoV-2) nucleic acids may be present in the submitted sample  additional confirmatory testing may be necessary for epidemiological  and / or clinical management purposes  to differentiate between  SARS-CoV-2 and other Sarbecovirus currently known to infect humans.  If clinically indicated additional testing with an alternate test  methodology [phone]) is advised. The SARS-CoV-2 RNA is generally  detectable in upper and lower respiratory sp ecimens during the acute  phase of infection. The expected result is Negative. Fact Sheet for Patients:  StrictlyIdeas.no Fact Sheet for Healthcare Providers: BankingDealers.co.za This test is not yet approved or cleared by the [place] FDA and has been authorized for detection and/or diagnosis of SARS-CoV-2 by FDA under an Emergency Use Authorization (EUA).  This EUA will remain in effect (meaning this test can be used) for the duration of the COVID-19 declaration under Section 564(b)(1) of the Act, 21 U.S.C. section 360bbb-3(b)(1), unless the authorization is terminated or revoked sooner. Performed at [facility], [address], [place], [place] [address]   Rapid urine drug screen (hospital performed)     Status: Abnormal   Collection Time: 09/16/18  5:31 PM  Result Value Ref Range   Opiates NONE DETECTED NONE DETECTED   Cocaine NONE DETECTED NONE DETECTED   Benzodiazepines POSITIVE (A) NONE DETECTED   Amphetamines NONE DETECTED NONE DETECTED   Tetrahydrocannabinol NONE DETECTED NONE DETECTED   Barbiturates NONE DETECTED NONE DETECTED    Comment: (NOTE) DRUG SCREEN FOR MEDICAL PURPOSES ONLY.  IF CONFIRMATION IS NEEDED FOR ANY PURPOSE, NOTIFY LAB WITHIN 5 DAYS. LOWEST DETECTABLE LIMITS FOR URINE DRUG SCREEN Drug Class                     Cutoff (ng/mL) Amphetamine and  metabolites    1000  Barbiturate and metabolites    200 Benzodiazepine                 200 Tricyclics and metabolites     300 Opiates and metabolites        300 Cocaine and metabolites        300 THC                            50 Performed at [facility], [address] [place], [place] [address]   CBG monitoring, ED     Status: Abnormal   Collection Time: 09/16/18  5:37 PM  Result Value Ref Range   Glucose-Capillary 175 (H) 70 - 99 mg/dL   Comment 1 Notify RN    Comment 2 Document in Chart   Salicylate level     Status: None   Collection Time: 09/16/18  9:07 PM  Result Value Ref Range   Salicylate Lvl <7.0 2.8 - 30.0 mg/dL    Comment: Performed at [facility], [address] [place], [place] [address]  Blood gas, arterial     Status: Abnormal   Collection Time: 09/16/18  9:20 PM  Result Value Ref Range   FIO2 40.00    Delivery systems BILEVEL POSITIVE AIRWAY PRESSURE    Mode BILEVEL POSITIVE AIRWAY PRESSURE    Inspiratory PAP 16.0    Expiratory PAP 6.0    pH, Arterial 7.454 (H) 7.350 - 7.450   pCO2 arterial 41.4 32.0 - 48.0 mmHg   pO2, Arterial 95.5 83.0 - 108.0 mmHg   Bicarbonate 28.6 (H) 20.0 - 28.0 mmol/L   Acid-Base Excess 4.7 (H) 0.0 - 2.0 mmol/L   O2 Saturation 97.5 %   Patient temperature 98.6    Collection site RIGHT RADIAL    Drawn by [phone]    Sample type ARTERIAL    Allens test (pass/fail) PASS PASS  Glucose, capillary     Status: Abnormal   Collection Time: 09/16/18  9:53 PM  Result Value Ref Range   Glucose-Capillary 151 (H) 70 - 99 mg/dL  Comprehensive metabolic panel     Status: Abnormal   Collection Time: 09/17/18  6:58 AM  Result Value Ref Range   Sodium 138 135 - 145 mmol/L   Potassium 4.8 3.5 - 5.1 mmol/L    Comment: SLIGHT HEMOLYSIS   Chloride 101 98 - 111 mmol/L   CO2 25 22 - 32 mmol/L   Glucose, Bld 146 (H) 70 - 99 mg/dL   BUN 17 6 - 20 mg/dL   Creatinine, Ser 1.471.02 0.61 - 1.24 mg/dL   Calcium 9.4 8.9 - 82.910.3 mg/dL   Total  Protein 8.1 6.5 - 8.1 g/dL   Albumin 3.6 3.5 - 5.0 g/dL   AST 27 15 - 41 U/L   ALT 19 0 - 44 U/L   Alkaline Phosphatase 52 38 - 126 U/L   Total Bilirubin 1.7 (H) 0.3 - 1.2 mg/dL   GFR calc non Af Amer >60 >60 mL/min   GFR calc Af Amer >60 >60 mL/min   Anion gap 12 5 - 15    Comment: Performed at [facility], [address] [place], [place] [address]  CBC     Status: Abnormal   Collection Time: 09/17/18  6:58 AM  Result Value Ref Range   WBC 10.7 (H) 4.0 - 10.5 K/uL   RBC 5.64 4.22 - 5.81 MIL/uL   Hemoglobin 17.2 (  H) 13.0 - 17.0 g/dL   HCT 16.1 09.6 - 04.5 %   MCV 92.0 80.0 - 100.0 fL   MCH 30.5 26.0 - 34.0 pg   MCHC 33.1 30.0 - 36.0 g/dL   RDW 40.9 81.1 - 91.4 %   Platelets 229 150 - 400 K/uL   nRBC 0.0 0.0 - 0.2 %    Comment: Performed at [facility], [address] [place], [place] [address]  Glucose, capillary     Status: Abnormal   Collection Time: 09/17/18  7:35 AM  Result Value Ref Range   Glucose-Capillary 158 (H) 70 - 99 mg/dL  Glucose, capillary     Status: Abnormal   Collection Time: 09/17/18 11:44 AM  Result Value Ref Range   Glucose-Capillary 117 (H) 70 - 99 mg/dL    Medications:  Current Facility-Administered Medications  Medication Dose Route Frequency Provider Last Rate Last Dose  . atropine 1 MG/10ML injection 0.5 mg  0.5 mg Intravenous Q6H PRN Santos-Sanchez, Idalys, MD      . enoxaparin (LOVENOX) injection 40 mg  40 mg Subcutaneous Q24H Santos-Sanchez, Chelsea Primus, MD   40 mg at 09/16/18 2132  . gabapentin (NEURONTIN) capsule 200 mg  200 mg Oral TID Agapita Savarino, MD      . hydrOXYzine (ATARAX/VISTARIL) tablet 25 mg  25 mg Oral TID PRN Kaylub Detienne, MD      . insulin aspart (novoLOG) injection 0-15 Units  0-15 Units Subcutaneous TID WC Burna Cash, MD   3 Units at 09/17/18 0744  . insulin aspart (novoLOG) injection 0-5 Units  0-5 Units Subcutaneous QHS Santos-Sanchez, Idalys, MD      . naloxone [facility]) injection 0.4 mg  0.4 mg  Intravenous PRN Santos-Sanchez, Chelsea Primus, MD      . oxyCODONE (Oxy IR/ROXICODONE) immediate release tablet 7.5 mg  7.5 mg Oral Q6H PRN Santos-Sanchez, Idalys, MD   7.5 mg at 09/17/18 1213  . sodium chloride flush (NS) 0.9 % injection 3 mL  3 mL Intravenous Q12H Burna Cash, MD   3 mL at 09/17/18 1001    Musculoskeletal: Strength & Muscle Tone: not tested Gait & Station: not tested Patient leans: N/A  Psychiatric Specialty Exam: Physical Exam  Psychiatric: His mood appears anxious. His affect is labile. His speech is rapid and/or pressured. He is agitated and aggressive. Cognition and memory are normal. He expresses impulsivity. He expresses suicidal ideation.    Review of Systems  Constitutional: Negative.   HENT: Negative.   Eyes: Negative.   Respiratory: Negative.   Cardiovascular: Negative.   Skin: Negative.   Endo/Heme/Allergies: Negative.   Psychiatric/Behavioral: Positive for suicidal ideas.    Blood pressure 125/66, pulse (!) 43, temperature 98.2 F (36.8 C), temperature source Axillary, resp. rate (!) 21, height  (1.905 m), weight (!) 165 kg, SpO2 95 %.Body mass index is 45.47 kg/m.  General Appearance: Fairly Groomed  Eye Contact:  Minimal  Speech:  Pressured  Volume:  Increased  Mood:  Irritable  Affect:  Constricted and Labile  Thought Process:  Coherent  Orientation:  Full (Time, Place, and Person)  Thought Content:  Logical  Suicidal Thoughts:  Yes.  without intent/plan  Homicidal Thoughts:  No  Memory:  Immediate;   Fair Recent;   Fair Remote;   Fair  Judgement:  Poor  Insight:  Shallow  Psychomotor Activity:  Increased and Restlessness  Concentration:  Concentration: Fair and Attention Span: Fair  Recall:  [employer] of Knowledge:  Fair  Language:  Good  Akathisia:  No  Handed:  Right  AIMS (if indicated):     Assets:  Communication Skills  ADL's:  Intact  Cognition:  WNL  Sleep:   fair     Treatment Plan Summary: 43 y.o. male  with history of bipolar disorder, multiple past suicide attempts who was admitted to the hospital after he took 30-40 tablets of 0.2 mg of Clonidine. Patient is irritable, combative, has limited insight, unable to contract for safety and will benefit from inpatient psychiatric admission for stabilization after he is medically stable.  Recommendations: -Continue 1:1 sitter for safety. -Discontinue Ziprasidone due to prolong QTc interval on admission(534). -Pls monitor QTc interval daily and do not place patient on antipsychotic that prolong QTc. -Consider Gabapentin 200 mg tid for mood/chronic pain -Consider Hydroxyzine 25 mg tid PRN as needed for anxiety/agitation -Place patient on IVC if he refuses Voluntary inpatient psychiatric admission -Consider social worker consult to facilitate inpatient psychiatric admission   Disposition: Recommend psychiatric Inpatient admission when medically cleared.  This service was provided via telemedicine using a 2-way, interactive audio and video technology.  Names of all persons participating in this telemedicine service and their role in this encounter. Name: Su HoffMitchell Hoon Role: patient  Name: Desiree LucySandra Coley Role: RN  Name: Thedore MinsMojeed Jamyla Ard, MD Role: MD    Thedore MinsMojeed Mariyana Fulop, MD 09/17/2018 1:02 PM

## 2018-09-17 NOTE — Progress Notes (Signed)
   Subjective:  Patient became somnolent and hypoxic overnight.  ABG show very mild alkalosis but otherwise unremarkable.  PCCM was consulted with the recommendation of continuous close monitoring.  This morning Mr. Esquivias is awake and alert.  He is answering questions appropriately.  He states he did not take 40 tablets of clonidine intentionally.  He meant to take 40 tablets of aspirin.  He has been very sad due to the recent passing of her wife.  States that if he wanted to kill himself he would have used a gun.   RN paged stating patient has been more agitated this AM and at times violent with staff. Security was called.   Objective:  Vital signs in last 24 hours: Vitals:   09/17/18 0041 09/17/18 0500 09/17/18 0700 09/17/18 0821  BP:  113/72  119/62  Pulse:   (!) 47 (!) 43  Resp:   19 16  Temp: 98 F (36.7 C) 98.2 F (36.8 C)    TempSrc: Axillary Axillary    SpO2:   95% 96%  Weight:      Height:       General: Morbidly obese male, in bed, in no acute distress Eyes: PERRL CV: Regular rate and rhythm, no murmurs, rubs, or gallops Pulm: Clear to auscultation bilaterally, oxygenating well on room air Neuro: Alert and oriented x3, answering questions appropriately, moving all 4 extremities spontaneously and purposefully Ext: Warm and well-perfused without edema Skin: No diaphoresis, skin abrasions of bilateral upper extremities appear less erythematous than yesterday  Assessment/Plan:  Principal Problem:   Clonidine overdose Active Problems:   QT prolongation  # Clonidine overdose, intentional vs accidental: Mr. Massman mental status is much improved compared to yesterday.  No CNS depression or disphoresis on exam.  He does remain bradycardic with heart rates in the 40-50s but denies chest pain, dizziness, or shortness of breath.  His most recent EKG this morning shows a junctional rhythm and a prolonged QTC of 534 ms.  We will need to continue EKGs every 6 hours and avoid QT  prolonging medications as much as we can.  Unfortunately, most of his psychiatric meds are QT prolonging medications.  We will resume 2 of these today as stated below.  Clonidine's half-life ranges between 6 to 24 hours and therefore I would expect his clinical status to continue to improve as the day goes on.  Psychiatry has been consulted to assess patient for inpatient psychiatric admission.  He is currently IVC'd. We will continue 1:1 sitter for suicide attempt.  # Bipolar disorder  # ADHD Holding most of his home medications (oxycodone, Aderall, Klonopin, gabapentin, trazadone, and tizanidine) in the setting of somnolence.  I resumed his home Geodon and hydroxyzine given increased anxiety and agitation this morning.   Dispo: Pending evaluation from psychiatry.   Welford Roche, MD 09/17/2018, 10:40 AM Pager: [phone]

## 2018-09-17 NOTE — Progress Notes (Signed)
Patient continues to be verbally aggressive. Cussing out anyone that comes into his room. Patient is throwing cups with water and ice into hallway, attempted to throw BiPap machine but it was still plugged into wall. Patient asking for doctor and psychiatrist. Attempted to deescalate patient with no success, continues to be belligerent.

## 2018-09-17 NOTE — Progress Notes (Signed)
2140 Paged Dr. Gilford Rile regarding pt request for something to help him sleep.   2200 Dr. Gilford Rile called back, placed order for ramelteon for sleep. When going back into patient's room, patient was asleep and snoring. Will hold ramelteon for if patient wakes back up this evening.

## 2018-09-17 NOTE — Progress Notes (Signed)
Paged by RN that patient is agitated and aggressive towards staff. Went to evaluate patient at bedside. There were 4 police officers outside patient's room. He was in bed in no acute distress. He is requesting his home medications and to be left alone. He took his telemetry and IV off. I explained to him that his heart rhythm is not normal and that he is at risk of a fatal arrhythmia and death if were to continue all of his home medications. He became angry and told to me "go fuck yourself." He then said "let me die." I explained to patient that he needed to be respectful towards the staff as we all have a common goal of helping him and making him feel better. He told "it is bullshit", etc. He requested to be transferred to a "nut hospital." I explained the social worker was consulted today for placement but that it was going to take some time. He became angrier and agitated. Attempted to get out of bed. I tried to de-escalate situation without success. He then broke the telephone in his room and attempted to throw it at me. I stepped out of the room due to concern for my safety.  Spoke to patient's RN.  Discussed I am unable to order any chemical restraints medications as anti-psychotics can prolong QT and his is already 534 ms with a junctional rhythm on most recent EKG. He is unable to leave AMA because he is IVC'd. Recommended continuous police presence and minimal interaction with staff. Advised to page [place] with future concerns but this is a challenging situation as the medications he needs for agitation can worsen QT prolongation and put him at risk for a fatal arrhythmia.   Welford Roche, MD  Internal Medicine PGY-3  P [phone]

## 2018-09-17 NOTE — Progress Notes (Signed)
Pt noted removing BIPAP and requesting to have something to drink  became very agitated when informed that he is NPO, ripped off blood pressure cuff and threw it to the floor. Will contact provider for diet order.

## 2018-09-17 NOTE — Progress Notes (Signed)
CSW arrived to the unit after the patient was hitting his head on the wall and security was present. CSW spoke with the patient at bedside. The patient opened up about his issues. He stated that he lost his wife. He stated that he is a father and has sons. He stated that he misses his wife and being in a caretaker role. The patient stated that he is in chronic pain and that no one understands. The patient is agreeable to going to treatment. He just left last Friday from [place]. The patient stated that he did not intentionally overdose. He stated that he is on a lot of pain medication. He stated that he hides his medication so that his son won't steal it and take it. He stated that he put his clonopin in a baby aspirin bottle and took the medicine thinking it was aspirin. The patient stated that he called EMS. The patient stated that it he wanted to kill himself he would use one of his 51 guns that he has at home.   CSW continued to speak with the patient and calm him down. He reported that he is lonely and misses being with someone. CSW spoke with him about his coping skills. CSW spoke with him about his discharge plan. He stated that he will go to the [facility]. CSW stated that she sent his referral over and they should have a bed possibly tomorrow. CSW stated that she could page the MD to come back and speak with him since he has calmed down. The patient asked the CSW to stay on his case. CSW agreed and stated she would come by and speak with him in the morning.   CSW will continue to follow.   Domenic Schwab, MSW, [place] Worker [facility]  [phone]

## 2018-09-17 NOTE — Progress Notes (Addendum)
CSW checked to see if the IVC was placed on the patient's chart. It was completed.   CSW called and spoke with Mendel Ryder at [facility] to make the referral for inpatient treatment. She did not have any beds available today. She suggested that the CSW follow up with Emeline [facility] at [facility] on Monday. Her contact number is [phone]. The IVC will need to be faxed over to [facility] once the patient has a bed available. Patient will need [employer] to transport over to the hospital.   CSW will continue to follow and assist with disposition.   Domenic Schwab, MSW, [place] Worker [facility]  [phone]

## 2018-09-17 NOTE — Progress Notes (Addendum)
Patient verbally and physically aggressive with staff. Upset that he cannot have his medication for agitation and that he has to be on suicide monitoring. Patient pushed heart monitor against the wall. Yelling "If I wanted to kill myself I would I have 90 guns at home," "Give me some fucking medicine!"   Medical team has been contacted and awaiting response.

## 2018-09-18 ENCOUNTER — Other Ambulatory Visit: Payer: Self-pay

## 2018-09-18 DIAGNOSIS — T50902A Poisoning by unspecified drugs, medicaments and biological substances, intentional self-harm, initial encounter: Secondary | ICD-10-CM

## 2018-09-18 DIAGNOSIS — Z7984 Long term (current) use of oral hypoglycemic drugs: Secondary | ICD-10-CM

## 2018-09-18 DIAGNOSIS — Z79899 Other long term (current) drug therapy: Secondary | ICD-10-CM

## 2018-09-18 DIAGNOSIS — H6091 Unspecified otitis externa, right ear: Secondary | ICD-10-CM

## 2018-09-18 DIAGNOSIS — Z792 Long term (current) use of antibiotics: Secondary | ICD-10-CM

## 2018-09-18 DIAGNOSIS — R0789 Other chest pain: Secondary | ICD-10-CM

## 2018-09-18 DIAGNOSIS — Z720 Tobacco use: Secondary | ICD-10-CM

## 2018-09-18 LAB — BASIC METABOLIC PANEL
Anion gap: 10 (ref 5–15)
BUN: 13 mg/dL (ref 6–20)
CO2: 29 mmol/L (ref 22–32)
Calcium: 9.1 mg/dL (ref 8.9–10.3)
Chloride: 100 mmol/L (ref 98–111)
Creatinine, Ser: 1.11 mg/dL (ref 0.61–1.24)
GFR calc Af Amer: >60 mL/min (ref >60)
GFR calc non Af Amer: >60 mL/min (ref >60)
Glucose, Bld: 147 mg/dL — ABNORMAL HIGH (ref 70–99)
Potassium: 4 mmol/L (ref 3.5–5.1)
Sodium: 139 mmol/L (ref 135–145)

## 2018-09-18 LAB — TROPONIN I (HIGH SENSITIVITY): Troponin I (High Sensitivity): 5 ng/L (ref <18)

## 2018-09-18 LAB — GLUCOSE, CAPILLARY
Glucose-Capillary: 175 mg/dL — ABNORMAL HIGH (ref 70–99)
Glucose-Capillary: 229 mg/dL — ABNORMAL HIGH (ref 70–99)
Glucose-Capillary: 148 mg/dL — ABNORMAL HIGH (ref 70–99)
Glucose-Capillary: 191 mg/dL — ABNORMAL HIGH (ref 70–99)

## 2018-09-18 MED ORDER — OLANZAPINE 5 MG PO TABS
5.0000 mg | ORAL_TABLET | Freq: Every day | ORAL | Status: DC
  Filled 2018-09-18: qty 1

## 2018-09-18 MED ORDER — ASPIRIN 325 MG PO TABS
325.0000 mg | ORAL_TABLET | Freq: Once | ORAL | Status: AC
  Administered 2018-09-18: 325 mg via ORAL
  Filled 2018-09-18: qty 1

## 2018-09-18 MED ORDER — OLANZAPINE 10 MG IM SOLR
5.0000 mg | Freq: Every day | INTRAMUSCULAR | Status: AC
  Filled 2018-09-18: qty 10

## 2018-09-18 MED ORDER — NITROGLYCERIN 0.4 MG SL SUBL: 0.4000 mg | SUBLINGUAL_TABLET | SUBLINGUAL | Status: DC | PRN

## 2018-09-18 MED ORDER — PANTOPRAZOLE SODIUM 40 MG PO TBEC
40.0000 mg | DELAYED_RELEASE_TABLET | Freq: Every day | ORAL | Status: DC
  Administered 2018-09-18 – 2018-09-21 (×4): 40 mg via ORAL
  Filled 2018-09-18 (×4): qty 1

## 2018-09-18 MED ORDER — OLANZAPINE 5 MG PO TABS: 5.0000 mg | ORAL_TABLET | Freq: Every day | ORAL | Status: DC

## 2018-09-18 MED ORDER — LORAZEPAM 0.5 MG PO TABS
0.5000 mg | ORAL_TABLET | Freq: Once | ORAL | Status: AC
  Administered 2018-09-18: 0.5 mg via ORAL
  Filled 2018-09-18: qty 1

## 2018-09-18 MED ORDER — IBUPROFEN 200 MG PO TABS
400.0000 mg | ORAL_TABLET | Freq: Once | ORAL | Status: AC
  Administered 2018-09-18: 400 mg via ORAL
  Filled 2018-09-18: qty 2

## 2018-09-18 MED ORDER — OXYCODONE HCL 5 MG PO TABS
10.0000 mg | ORAL_TABLET | Freq: Four times a day (QID) | ORAL | Status: DC | PRN
  Administered 2018-09-18 – 2018-09-21 (×11): 10 mg via ORAL
  Filled 2018-09-18 (×12): qty 2

## 2018-09-18 MED ORDER — CLONAZEPAM 1 MG PO TABS
1.0000 mg | ORAL_TABLET | Freq: Two times a day (BID) | ORAL | Status: DC
  Administered 2018-09-18 – 2018-09-19 (×3): 1 mg via ORAL
  Filled 2018-09-18 (×4): qty 1

## 2018-09-18 MED ORDER — OLANZAPINE 5 MG PO TABS
5.0000 mg | ORAL_TABLET | Freq: Once | ORAL | Status: DC
  Filled 2018-09-18: qty 1

## 2018-09-18 MED ORDER — RAMELTEON 8 MG PO TABS
8.0000 mg | ORAL_TABLET | Freq: Once | ORAL | Status: AC
  Administered 2018-09-18: 8 mg via ORAL
  Filled 2018-09-18: qty 1

## 2018-09-18 MED ORDER — ATORVASTATIN CALCIUM 10 MG PO TABS: 20.0000 mg | ORAL_TABLET | Freq: Every day | ORAL | Status: DC

## 2018-09-18 MED ORDER — OLANZAPINE 5 MG PO TABS
5.0000 mg | ORAL_TABLET | Freq: Every day | ORAL | Status: AC
  Administered 2018-09-18: 5 mg via ORAL
  Filled 2018-09-18: qty 1

## 2018-09-18 MED ORDER — CIPROFLOXACIN-DEXAMETHASONE 0.3-0.1 % OT SUSP
4.0000 [drp] | Freq: Two times a day (BID) | OTIC | Status: DC
  Administered 2018-09-18 – 2018-09-21 (×7): 4 [drp] via OTIC
  Filled 2018-09-18 (×3): qty 7.5

## 2018-09-18 NOTE — Progress Notes (Signed)
Patient c/o ear pain. MD notified. MD states they have assessed his pain a few days prior and will not be giving him pain medication at this time. Patient made aware.

## 2018-09-18 NOTE — Progress Notes (Signed)
IMTS paged about patient's agitation multiple times throughout the day. We have been to patient's bedside to evaluate him multiple times throughout the day since yesterday per RN request. Unfortunately, we have not been able to chemically restrain him safely due to EKG showing a junctional rhythm and QT prolongation which puts him at high risk for fatal arrhythmias. We re-consulted psychiatry urgently this AM due to increased aggression but unfortunately he has not been seen by them yet. We have reached out to them twice more during the day. They are not seeing patients in-person at this time. This is out of our hands. We have returned all staff's pages as time permits as we always do. As stated before, this is a challenging situation for all of [place] and there is no immediate treatment/resolution for this. Hopefully psychiatry will evaluate patient soon and make further recommendations. We have resumed some of his home medications with the hope his behavior improves. I continue to recommend constant police presence and limited interaction with patient to avoid increased anxiety and agitation.   Welford Roche, MD  Internal Medicine PGY-3  [phone]

## 2018-09-18 NOTE — Progress Notes (Signed)
CSW faxed the patient out to [facility]. They do not have a bed today. The will call with bed offer.   CSW will continue to follow.   Domenic Schwab, MSW, [place] Worker [facility]  [phone]

## 2018-09-18 NOTE — Procedures (Signed)
Offered CPAP for sleep, patient states "you can hang it up, I'm not wearing it".  Refusal noted on Doc Flowsheet for CPAP.

## 2018-09-18 NOTE — Progress Notes (Signed)
Psychiatry called and spoke to nurse regarding patient. Psychiatry able to do consult with patient. Ipad retrieved and consult took place over secure network. Awaiting recommendations.

## 2018-09-18 NOTE — Progress Notes (Signed)
   09/18/18 1300  Clinical Encounter Type  Visited With Health care provider  Visit Type Initial  Referral From Nurse   Responded to Epic consult from night RN.  Per day RN, pt made comments overnight about not wanting to be "brought back" in the future if OD (not clear if pt or RN stated OD) and night RN wanted pt to talk w/ chaplain.  While physician is the one to consult for DNR (as acknowledged by day RN), chaplain is willing to discuss this with patient, in light of his beliefs, hopes, etc.  RN ck'd with pt, he did not wish to talk to chaplain at this time.  Pls re-consult if pt desires in future.  Myra Gianotti resident, [phone]

## 2018-09-18 NOTE — Progress Notes (Signed)
Patient is upset that phone was removed from room due to precautions being in place. Patient threw desk and chairs into hallway. Ripped the filter in room from window and threw it. Is yelling and cussing at staff.   Security called and are at bedside. Medical team called and are supposed to be coming.

## 2018-09-18 NOTE — Progress Notes (Addendum)
Paged by RN regarding chest pain. Evaluated Bryan Gallegos at bedside. He states he began to experience acute onset chest pain with radiation to his shoulder and down to his legs without any obvious inciting event. He states the pain started couple hours ago and has been constant with progressively worsening. He described the pain as stabbing pain 9/10 with downward radiation worsened with deep breaths. Also endorsing headache. Denies any diaphoresis, nausea, vomiting, palpitations, or dyspnea. He mentions no prior history of similar symptoms.  Gen: Morbidly obese HEENT: NCAT head, hearing intact, EOMI, PERRL Neck: supple, ROM intact, no JVD, no cervical adenopathy CV: Bradycardic, regular rhythm, no rubs, no murmurs, no gallops Pulm: CTAB, No rales, no wheezes MSK: Chest wall tenderness to palpation Extm: ROM intact, Peripheral pulses intact, No peripheral edema Skin: Dry, Warm, normal turgor, no rashes, lesions, wounds.  Neuro: AAOx3, Cranial Nerve II-XII intact  Atypical Chest pain 2/2 MSK vs ACS EKG w/ bradycardia and prolonged QT but no ST changes. Likely due to musculoskeletal pain based on description and exam but he has multiple risk factor for ACS including morbid obesity, current tobacco use, diabetes and hypertension. Will get troponin to r/o assess for ACS. He has oxy on board for MSK pain - Stat troponin - Oxy-IR on board - Aspirin 325mg  - Sublingual nitro PRN  Addendum: High-sensitive Trop negative

## 2018-09-18 NOTE — Progress Notes (Addendum)
1720: Patient became upset that the psychiatrist that the medical team promised that was going to come see him had not arrived. Patient got up and out of bed and went to the window where he was repeatedly banging his head against the glass window. Teaching service had been paged three times before a call back was recieved. When they finally did call back they were going to discuss what to do, no further call has been received and it has been over 30 minutes. No new orders placed at this time.  Security and [employer] called to bedside for patient safety.

## 2018-09-18 NOTE — Progress Notes (Signed)
Patient refusing to have vitals taken. Medical team aware.

## 2018-09-18 NOTE — Progress Notes (Signed)
CSW called Otila Kluver at [facility] to inquire about a bed for the patient. Otila Kluver doesn't have any beds available at this time. Will give the CSW a phone call if one becomes available.   CSW will continue to follow.   Domenic Schwab, MSW, [place] Worker [facility]  [phone]

## 2018-09-18 NOTE — Progress Notes (Signed)
Patient asked to contact social worker regarding bed placement. Patient would like to be submitted to [facility] addition to [facility]. Urban Gibson, SW made known of patients wishes and will work on getting information submitted.

## 2018-09-18 NOTE — Progress Notes (Signed)
Pt refusing to wear CPAP.  

## 2018-09-18 NOTE — Progress Notes (Signed)
Patient continues to c/o right ear pain. States 10/10 pain. Patient requesting the MD be contacted again regarding ear.  Medical team to reassess patient after seeing other patients.

## 2018-09-18 NOTE — Progress Notes (Signed)
Security was called due to patient getting out of bed and banging his head repeatedly on the wardrobe in his room.  The patient had been verbally abusive to his nurse and sitter and had refused all care and medications.  This charge nurse explained to patient that his safety is of paramount concern and that everyone present (including security) are committed to ensuring this safety.  The patient did allow this nurse to complete physical assessment.  He denies pain.  Medications he was willing to take were given.  Concerns were expressed over him banging his head; he has promised to refrain from this.

## 2018-09-18 NOTE — Progress Notes (Signed)
Subjective:  Today with reports from nursing about increased patient agitation and aggression.  At bedside, patient reports feeling as though he is being treated like a child after his TV remote and other items in his room or taken away.  States that he has no intention of hurting himself and that if he actually wanted to he has a number of firearms at home he could have used.  Endorses increased anxiety and requests his meds to help with his current symptoms.  He also reports continued right ear pain that radiates down the right side of his jaw which has been going on for 3 weeks.  Denies any symptoms in his left ear.  Objective:  Vital signs in last 24 hours: Vitals:   09/17/18 1650 09/17/18 2138 09/18/18 0002 09/18/18 0308  BP: (!) 123/59 (!) 136/108 (!) 127/94 115/60  Pulse: (!) 47 (!) 48 (!) 101 (!) 50  Resp: 19  20 12   Temp:  98.4 F (36.9 C) 98.1 F (36.7 C) 98.5 F (36.9 C)  TempSrc:  Oral Oral Oral  SpO2: 96% 100% 94% 97%  Weight:      Height:       Weight change:  No intake or output data in the 24 hours ending 09/18/18 1449  Physical Exam  *Exam was limited due to concerns of patient agitation/agression* Constitutional: Sitting in chair in room, appeared agitated HENT: Mild erythema in right ear canal with normal TM.  Pulling back pain on the pinna of the right ear elicits a pain sensation radiating from the zygomatic process of the temporal bone down along the right lateral aspect of the mandible.  No pain with palpation of the mastoid bone.  No abnormalities noted in the left ear.  No palpable lymphadenopathy noted.  Throat is nonerythematous and no lesions or swelling noted in the oral cavity. Skin: Not diaphoretic. No erythema.   Assessment/Plan:  Principal Problem:   Clonidine overdose Active Problems:   QT prolongation   Bipolar 1 disorder, mixed, severe (HCC) # Clonidine overdose, intentional vs accidental:  Bradycardia has continued to improve.  Paged by  nurse multiple times today with reports of increased patient agitation and aggression with patient throwing furniture out of the room.  After discussion with patient we agreed to recheck his EKG for improvement in his QT interval before considering restarting any of his home psych meds.  Is also willing to be reevaluated by a different psychiatrist after a poor interaction with his previous psychiatrist yesterday. He is currently IVC'd. We will continue 1:1 sitter for suicide attempt.  # Bipolar disorder  # ADHD Patient with continued requests to restart his home medications which were initially held due to concerns for his prolonged QT interval.  Today's EKG showing improvement in QT interval to 492 ms from 564 ms yesterday.  Consulted psychiatry for recommendations and follow-up patient evaluation.  They were in agreement with our plan to restart his home Klonopin today and also agreed to come reevaluate the patient. - Restarted home Klonopin 1 mg BID - 1 dose 0.5 mg Ativan - EKG Q6H  Right Ear Pain: Patient reports that symptoms have been present for the past 3 weeks.  Around the time of onset of his symptoms he was started on oral clindamycin.  On examination the tympanic membrane is not bulging or erythematous nor exhibiting discharge.  The right ear canal appeared mildly erythematous when compared to the left ear.  Mastoid bone without swelling, erythema, pain with palpation  making mastoiditis less likely.  Symptoms may be associated with otitis externa.  Will treat with antimicrobial eardrops and monitor for improvement. - Ciprodex 4 drops in right ear BID  T2DM: Mild elevation in glucose ~210 average. - will restart home Glucophage - Restart Lipitor  Dispo: Pending evaluation from psychiatry.    LOS: 1 day   Cristopher EstimableMcNeill, Motty Borin L, Medical Student 09/18/2018, 2:49 PM
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
## 2018-09-18 NOTE — Progress Notes (Signed)
Patient c/o headache. MD paged. New orders obtained.

## 2018-09-19 DIAGNOSIS — S0181XA Laceration without foreign body of other part of head, initial encounter: Secondary | ICD-10-CM

## 2018-09-19 DIAGNOSIS — X838XXA Intentional self-harm by other specified means, initial encounter: Secondary | ICD-10-CM

## 2018-09-19 DIAGNOSIS — T50902A Poisoning by unspecified drugs, medicaments and biological substances, intentional self-harm, initial encounter: Secondary | ICD-10-CM

## 2018-09-19 DIAGNOSIS — F3163 Bipolar disorder, current episode mixed, severe, without psychotic features: Secondary | ICD-10-CM

## 2018-09-19 DIAGNOSIS — Z79891 Long term (current) use of opiate analgesic: Secondary | ICD-10-CM

## 2018-09-19 LAB — GLUCOSE, CAPILLARY
Glucose-Capillary: 126 mg/dL — ABNORMAL HIGH (ref 70–99)
Glucose-Capillary: 123 mg/dL — ABNORMAL HIGH (ref 70–99)
Glucose-Capillary: 137 mg/dL — ABNORMAL HIGH (ref 70–99)

## 2018-09-19 MED ORDER — CLONAZEPAM 1 MG PO TABS
1.0000 mg | ORAL_TABLET | Freq: Two times a day (BID) | ORAL | Status: AC
  Administered 2018-09-19 (×2): 1 mg via ORAL
  Filled 2018-09-19: qty 1

## 2018-09-19 MED ORDER — CLONIDINE HCL 0.2 MG PO TABS
0.2000 mg | ORAL_TABLET | Freq: Every day | ORAL | Status: DC
  Administered 2018-09-19 – 2018-09-21 (×3): 0.2 mg via ORAL
  Filled 2018-09-19 (×3): qty 1

## 2018-09-19 MED ORDER — LORAZEPAM 2 MG/ML IJ SOLN
2.0000 mg | Freq: Once | INTRAMUSCULAR | Status: AC
  Administered 2018-09-19: 2 mg via INTRAVENOUS
  Filled 2018-09-19: qty 1

## 2018-09-19 MED ORDER — CLONAZEPAM 1 MG PO TABS
2.0000 mg | ORAL_TABLET | Freq: Once | ORAL | Status: AC
  Administered 2018-09-19: 2 mg via ORAL
  Filled 2018-09-19: qty 2

## 2018-09-19 MED ORDER — HALOPERIDOL LACTATE 5 MG/ML IJ SOLN
5.0000 mg | Freq: Once | INTRAMUSCULAR | Status: AC
  Administered 2018-09-19: 5 mg via INTRAVENOUS
  Filled 2018-09-19: qty 1

## 2018-09-19 MED ORDER — CLONAZEPAM 0.5 MG PO TBDP: 1.0000 mg | ORAL_TABLET | Freq: Two times a day (BID) | ORAL | Status: DC

## 2018-09-19 MED ORDER — CLONAZEPAM 1 MG PO TABS
1.0000 mg | ORAL_TABLET | Freq: Three times a day (TID) | ORAL | Status: DC
  Administered 2018-09-20: 1 mg via ORAL
  Filled 2018-09-19: qty 1

## 2018-09-19 MED ORDER — OLANZAPINE 5 MG PO TABS
5.0000 mg | ORAL_TABLET | Freq: Every day | ORAL | Status: DC
  Administered 2018-09-19: 5 mg via ORAL
  Filled 2018-09-19 (×2): qty 1

## 2018-09-19 MED ORDER — ZIPRASIDONE MESYLATE 20 MG IM SOLR
20.0000 mg | Freq: Once | INTRAMUSCULAR | Status: DC
  Filled 2018-09-19: qty 20
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
## 2018-09-19 NOTE — Progress Notes (Signed)
Patient has remained calm and cooperative with Retail banker. Patient says he is upset because he did not try to commit suicide. Patient said he was trying to take baby aspirin and that he takes 30 plus baby aspirin once per week due to his thick blood. Nurse explained to patient the dangers of taking a large amount of baby aspirin. RN explained to patient the difference between an anti-platelet medication and an anticoagulant. RN also explained to patient the importance of getting prescriptions from a physician and not self medicating. Patient stated he understood and would no longer take 30 plus baby aspirin weekly. Patient also said he has not slept since Saturday and needed something for sleep. Physician contacted. Medication ordered. See MAR.

## 2018-09-19 NOTE — Progress Notes (Signed)
Romelteon effective. Patient slept for 6 hours. Patient calm and cooperative at this time.
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
## 2018-09-19 NOTE — Progress Notes (Signed)
Contacted RN Silva Bandy and spoke to her about patient. Provided her with pager number for IMTS- [phone] for future questions.
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
## 2018-09-19 NOTE — Progress Notes (Addendum)
Patient is up out of bed crying and pulling at cords .He has requested to talk to a Dr . I have paged Dr Maricela Bo x 2 , Georgann Housekeeper NP and Dr Ander Slade There are [employer] and a sitter at the bedside

## 2018-09-19 NOTE — Progress Notes (Signed)
Called to see patient was getting increasingly agitated  2 mg Ativan IV x1 Haldol 5 mg IV x1  Place of is at bedside Patient just wants to get some rest, feeling increasingly agitated Klonopin does not seem to be helping him
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
## 2018-09-20 ENCOUNTER — Encounter (HOSPITAL_COMMUNITY): Payer: Self-pay

## 2018-09-20 DIAGNOSIS — T465X2A Poisoning by other antihypertensive drugs, intentional self-harm, initial encounter: Principal | ICD-10-CM

## 2018-09-20 DIAGNOSIS — T1491XA Suicide attempt, initial encounter: Secondary | ICD-10-CM

## 2018-09-20 DIAGNOSIS — T50904D Poisoning by unspecified drugs, medicaments and biological substances, undetermined, subsequent encounter: Secondary | ICD-10-CM

## 2018-09-20 LAB — GLUCOSE, CAPILLARY
Glucose-Capillary: 129 mg/dL — ABNORMAL HIGH (ref 70–99)
Glucose-Capillary: 163 mg/dL — ABNORMAL HIGH (ref 70–99)
Glucose-Capillary: 139 mg/dL — ABNORMAL HIGH (ref 70–99)

## 2018-09-20 MED ORDER — HALOPERIDOL LACTATE 5 MG/ML IJ SOLN
5.0000 mg | Freq: Once | INTRAMUSCULAR | Status: AC
  Administered 2018-09-20: 5 mg via INTRAMUSCULAR

## 2018-09-20 MED ORDER — HALOPERIDOL LACTATE 5 MG/ML IJ SOLN
5.0000 mg | Freq: Every day | INTRAMUSCULAR | Status: DC
  Administered 2018-09-20: 5 mg via INTRAMUSCULAR
  Filled 2018-09-20 (×2): qty 1

## 2018-09-20 MED ORDER — OLANZAPINE 5 MG PO TABS
5.0000 mg | ORAL_TABLET | Freq: Every day | ORAL | Status: DC
  Administered 2018-09-21: 5 mg via ORAL
  Filled 2018-09-20 (×2): qty 1

## 2018-09-20 MED ORDER — LORAZEPAM 2 MG/ML IJ SOLN: 2.0000 mg | Freq: Three times a day (TID) | INTRAMUSCULAR | Status: DC

## 2018-09-20 MED ORDER — RAMELTEON 8 MG PO TABS
8.0000 mg | ORAL_TABLET | Freq: Every day | ORAL | Status: DC
  Administered 2018-09-20: 8 mg via ORAL
  Filled 2018-09-20 (×2): qty 1

## 2018-09-20 MED ORDER — LORAZEPAM 2 MG/ML IJ SOLN
2.0000 mg | Freq: Three times a day (TID) | INTRAMUSCULAR | Status: DC
  Filled 2018-09-20: qty 1

## 2018-09-20 MED ORDER — LORAZEPAM 2 MG/ML IJ SOLN
2.0000 mg | Freq: Three times a day (TID) | INTRAMUSCULAR | Status: DC
  Administered 2018-09-20 – 2018-09-21 (×4): 2 mg via INTRAMUSCULAR
  Filled 2018-09-20 (×4): qty 1

## 2018-09-20 MED ORDER — HALOPERIDOL LACTATE 5 MG/ML IJ SOLN: 5.0000 mg | Freq: Every day | INTRAMUSCULAR | Status: DC

## 2018-09-20 NOTE — Progress Notes (Signed)
CSW completed [facility] application, and faxed to [facility] admissions. Spoke with admissions representative to complete phone referral of the application. Requested priority admission due to patient's aggressive behaviors and sent documentation to support request.   CSW to follow.  Laveda Abbe, [place] Clinical Social Worker [phone]
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
## 2018-09-20 NOTE — Care Management Important Message (Signed)
Important Message  Patient Details  Name: Bryan Gallegos MRN: [id number] Date of Birth: August 28, 1975   Medicare Important Message Given:  Yes     Orbie Pyo 09/20/2018, 1:31 PM

## 2018-09-20 NOTE — Progress Notes (Signed)
Pt from 42M. Pt has Financial planner at bedside and sleeping at this time.

## 2018-09-20 NOTE — Progress Notes (Signed)
Medical team conferred regarding disposition plan ... Psych bed pending. Consulted with [place]. Whitman Hero RN,BSN,CM [phone]

## 2018-09-20 NOTE — Progress Notes (Signed)
Bryan Gallegos, Bryan Gallegos stated that the patient refused his lunch blood sugar check.
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
## 2018-09-20 NOTE — Progress Notes (Signed)
Student Pharmacist rounding with the IMTS-B1 service was consulted on the optimization of psychiatric medications. The patient is a 43 year old male on day 3 of hospital admission for a clonidine overdose. The patient is still experiencing agitation and warranted a change in his medication regimen. He was managed with clonazepam 1mg  three times a day, olanzapine 5mg  daily at bedtime, hydroxyzine 25mg  by mouth three times daily as needed, ziprasidone 20mg  injection once, and lorazepam 2mg  injection once. His most recent QTc documented in the medical student's note is 436ms. After a discussion with the medicine team, his current regimen is lorazepam 2mg  injection every 8 hours, haloperidol 5mg  injection daily at bedtime, hydroxyzine 25mg  by mouth three times daily as needed, and olanzapine 5mg  by mouth once daily. Ziprasidone was removed as it can prolong the QTc interval when given in combination with olanzapine. Continue to monitor EKG for changes in his QTc, signs of agitation, and clinical improvement.  Youlanda Roys, 4th year Stage manager

## 2018-09-20 NOTE — Consult Note (Signed)
Attempted to see patient but nurse reports that patient is sleep and not able to be aroused. Informed to contact psychiatry when patient is able to participate in interview.   Buford Dresser, DO 09/20/18 5:19 PM

## 2018-09-20 NOTE — Progress Notes (Signed)
Pt refuses Cpap. 

## 2018-09-21 ENCOUNTER — Inpatient Hospital Stay (HOSPITAL_COMMUNITY)
Admission: AD | Admit: 2018-09-21 | Discharge: 2018-09-21 | DRG: 883 | Disposition: A | Discharge status: Home / Self Care | Payer: Medicare HMO | Source: Intra-hospital | Attending: Psychiatry | Admitting: Psychiatry

## 2018-09-21 ENCOUNTER — Other Ambulatory Visit: Payer: Self-pay | Admitting: Behavioral Health

## 2018-09-21 ENCOUNTER — Encounter (HOSPITAL_COMMUNITY): Payer: Self-pay | Admitting: General Practice

## 2018-09-21 DIAGNOSIS — Z886 Allergy status to analgesic agent status: Secondary | ICD-10-CM

## 2018-09-21 DIAGNOSIS — E785 Hyperlipidemia, unspecified: Secondary | ICD-10-CM | POA: Diagnosis present

## 2018-09-21 DIAGNOSIS — Z833 Family history of diabetes mellitus: Secondary | ICD-10-CM | POA: Diagnosis not present

## 2018-09-21 DIAGNOSIS — G8929 Other chronic pain: Secondary | ICD-10-CM | POA: Diagnosis present

## 2018-09-21 DIAGNOSIS — E873 Alkalosis: Secondary | ICD-10-CM | POA: Diagnosis not present

## 2018-09-21 DIAGNOSIS — M549 Dorsalgia, unspecified: Secondary | ICD-10-CM | POA: Diagnosis present

## 2018-09-21 DIAGNOSIS — Z818 Family history of other mental and behavioral disorders: Secondary | ICD-10-CM | POA: Diagnosis not present

## 2018-09-21 DIAGNOSIS — I10 Essential (primary) hypertension: Secondary | ICD-10-CM | POA: Diagnosis present

## 2018-09-21 DIAGNOSIS — E662 Morbid (severe) obesity with alveolar hypoventilation: Secondary | ICD-10-CM | POA: Diagnosis not present

## 2018-09-21 DIAGNOSIS — Z87892 Personal history of anaphylaxis: Secondary | ICD-10-CM | POA: Diagnosis not present

## 2018-09-21 DIAGNOSIS — Z823 Family history of stroke: Secondary | ICD-10-CM

## 2018-09-21 DIAGNOSIS — Z841 Family history of disorders of kidney and ureter: Secondary | ICD-10-CM

## 2018-09-21 DIAGNOSIS — F609 Personality disorder, unspecified: Principal | ICD-10-CM | POA: Diagnosis present

## 2018-09-21 DIAGNOSIS — J45909 Unspecified asthma, uncomplicated: Secondary | ICD-10-CM | POA: Diagnosis present

## 2018-09-21 DIAGNOSIS — Z8249 Family history of ischemic heart disease and other diseases of the circulatory system: Secondary | ICD-10-CM | POA: Diagnosis not present

## 2018-09-21 DIAGNOSIS — Z888 Allergy status to other drugs, medicaments and biological substances status: Secondary | ICD-10-CM

## 2018-09-21 DIAGNOSIS — R451 Restlessness and agitation: Secondary | ICD-10-CM | POA: Diagnosis not present

## 2018-09-21 DIAGNOSIS — F17210 Nicotine dependence, cigarettes, uncomplicated: Secondary | ICD-10-CM | POA: Diagnosis present

## 2018-09-21 DIAGNOSIS — Z7984 Long term (current) use of oral hypoglycemic drugs: Secondary | ICD-10-CM | POA: Diagnosis not present

## 2018-09-21 DIAGNOSIS — T465X2A Poisoning by other antihypertensive drugs, intentional self-harm, initial encounter: Secondary | ICD-10-CM | POA: Diagnosis not present

## 2018-09-21 DIAGNOSIS — F319 Bipolar disorder, unspecified: Secondary | ICD-10-CM | POA: Diagnosis present

## 2018-09-21 DIAGNOSIS — Z811 Family history of alcohol abuse and dependence: Secondary | ICD-10-CM | POA: Diagnosis not present

## 2018-09-21 DIAGNOSIS — E119 Type 2 diabetes mellitus without complications: Secondary | ICD-10-CM | POA: Diagnosis present

## 2018-09-21 DIAGNOSIS — F3163 Bipolar disorder, current episode mixed, severe, without psychotic features: Secondary | ICD-10-CM | POA: Diagnosis not present

## 2018-09-21 DIAGNOSIS — T1491XA Suicide attempt, initial encounter: Secondary | ICD-10-CM | POA: Diagnosis not present

## 2018-09-21 LAB — GLUCOSE, CAPILLARY
Glucose-Capillary: 140 mg/dL — ABNORMAL HIGH (ref 70–99)
Glucose-Capillary: 183 mg/dL — ABNORMAL HIGH (ref 70–99)

## 2018-09-21 NOTE — Progress Notes (Signed)
CSW contacted [facility] to check on status of referral. Spoke with Pamala Hurry, and referral is currently under medical review. Pamala Hurry will call CSW back after referral has completed medical review.  CSW to follow.  Laveda Abbe, [place] Clinical Social Worker [phone]

## 2018-09-21 NOTE — TOC Transition Note (Signed)
Transition of Care [facility]) - CM/SW Discharge Note   Patient Details  Name: Bryan Gallegos MRN: [id number] Date of Birth: [birth year]  [facility] CM/SW Contact:  Geralynn Ochs, LCSW Phone Number: 09/21/2018, 2:19 PM   Clinical Narrative:   Nurse to call report to [phone]. [employer] to pick up patient at 4:00 PM.  Nurse to please call report prior to pick-up. Please have any and all PRN medications given to patient prior to transport so he can be as calm as possible for transportation.     Final next level of care: Psychiatric Hospital Barriers to Discharge: Barriers Resolved   Patient Goals and CMS Choice        Discharge Placement                Patient to be transferred to facility by: [employer] Name of family member notified: NA Patient and family notified of of transfer: 09/21/18  Discharge Plan and Services                                     Social Determinants of Health (SDOH) Interventions     Readmission Risk Interventions No flowsheet data found.

## 2018-09-21 NOTE — Progress Notes (Signed)
  Date: 09/21/2018  Patient name: Bryan Gallegos  Medical record number: [id number]  Date of birth: [birth year]   I have discussed the plan of care with the house staff.  Our team did not evaluate the patient today as our presence increase his irritation and agitation.  Dr. Frederico Hamman spoke with the nurse on 2 occasions he reported he was calm.  Yesterday, the plan was for the patient to go to behavioral health.  Dr. Mariea Clonts left a note subsequently agreed with our medication changes and plans to discharge him home if he could demonstrate self-control of his behaviors.  Dr. Frederico Hamman reported she and Dr. Mariea Clonts spoke at length yesterday.  Neither Dr. Frederico Hamman or I are comfortable discharging him to home due to his suicide risk and poor impulse control.  This morning I spoke to Dr. Hulen Skains about my concerns.  Dr. Frederico Hamman has now informed me that he has been accepted at behavioral health and will be transferred.  The discharge summary has been completed and is in the chart.  Bartholomew Crews, MD 09/21/2018, 1:44 PM

## 2018-09-21 NOTE — BHH Suicide Risk Assessment (Signed)
[facility] Discharge Suicide Risk Assessment   Principal Problem: Sent for psychiatric screening Discharge Diagnoses: Polysubstance abuse/seeking  Bryan Gallegos is known to the service he is 43 years of age he was transferred from medical floor for a psychiatric evaluation.  He is seen in the triage area in the screening area, he is noted to be alert oriented fully and generally cooperative with interview process he denies thoughts of harming self denies thoughts of harming others and reports no psychotic symptoms.  He is informed that we will not continue prescribing opiates, benzodiazepines, and muscle relaxant such as Zanaflex on a detox ward and we can only offer him detox.  He states "I never wanted to come here" and he does not want to come particularly since he will not receive schedule II drugs.  This was the issue last time.  So on mental status exam he is not acutely suicidal he has no thoughts of harming self or others he has no need for stabilization at this point he is refusing detox measures and understands he will not receive schedule II drugs on this ward and he is therefore requesting discharge home and this is honored.     Total Time spent with patient: 30 minutes  Musculoskeletal: Strength & Muscle Tone: within normal limits Gait & Station: normal Patient leans: N/A  Psychiatric Specialty Exam: ROS  There were no vitals taken for this visit.There is no height or weight on file to calculate BMI.  General Appearance: Casual  Eye Contact::  Good  Speech:  Clear and Coherent409  Volume:  Normal  Mood:  Euthymic  Affect: Congruent and appropriate  Thought Process:  Goal Directed  Orientation:  Full (Time, Place, and Person)  Thought Content:  Logical  Suicidal Thoughts:  No  Homicidal Thoughts:  No  Memory:  Immediate;   Good  Judgement:  Fair  Insight:  Fair  Psychomotor Activity:  NA and Normal  Concentration:  Good  Recall:  Good  Fund of Knowledge:Good  Language:  Good  Akathisia:  Negative  Handed:  Right  AIMS (if indicated):     Assets:  Communication Skills Resilience  Sleep:     Cognition: WNL  ADL's:  Intact    Mental Status Per Nursing Assessment::   On Admission:     Demographic Factors:  Male  Loss Factors: Decrease in vocational status  Historical Factors: NA  Risk Reduction Factors:   Religious beliefs about death  Continued Clinical Symptoms:  Alcohol/Substance Abuse/Dependencies  Cognitive Features That Contribute To Risk:  None    Suicide Risk:  Minimal: No identifiable suicidal ideation.  Patients presenting with no risk factors but with morbid ruminations; may be classified as minimal risk based on the severity of the depressive symptoms    Plan Of Care/Follow-up recommendations:  Activity:  full  Karl Knarr, MD 09/21/2018, 4:32 PM

## 2018-09-21 NOTE — Progress Notes (Signed)
Pt prepared for DC to [facility]. A&O. Assessment charted. Report called to [facility] RN. Pt ambulated off unit, escorted by 3 [employer] officers.

## 2018-09-21 NOTE — Progress Notes (Signed)
I spoke with Dr. Mariea Clonts last night at around 1830. She evaluated patient through telehealth visit with visual aids. She states patient has good insight into his current situation and that his aggressive behavior is a mean to manipulate the staff into receiving medications. She agrees he has underlying psychiatric disorders but states his behavior is mostly manipulative. She discussed good impulse control and coping skills with him and developed a safety plan for him to go home if he showed appropriate behavior overnight. She planned to speak with one of the patient's son to ensure patient has adequate supervision at home. I expressed my concerns with this plan as patient has stated numerous times that his goal is either to go home and kill himself or go to jail, make bail, and then kill himself. I do not believe it is safe to send him home and therefore we will not be discharging patient home. I discussed this with Dr. Lynnae January this morning. She will discuss further with Dr. Hulen Skains.   Welford Roche, MD  Internal Medicine PGY-3 P [phone]   UPDATE: Patient now has a bed available. He has been discharged to [facility].

## 2018-09-22 ENCOUNTER — Encounter (HOSPITAL_COMMUNITY): Payer: Self-pay
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
## 2018-10-07 IMAGING — DX DG FOOT COMPLETE 3+V*L*
3 series · 3 of 3 positions shown · non-contrast
Comparison: None.

CLINICAL DATA: Lateral foot wound after stepping on object 2 days
ago. Diabetes.

EXAM:
LEFT FOOT - COMPLETE 3+ VIEW

[foot ap]
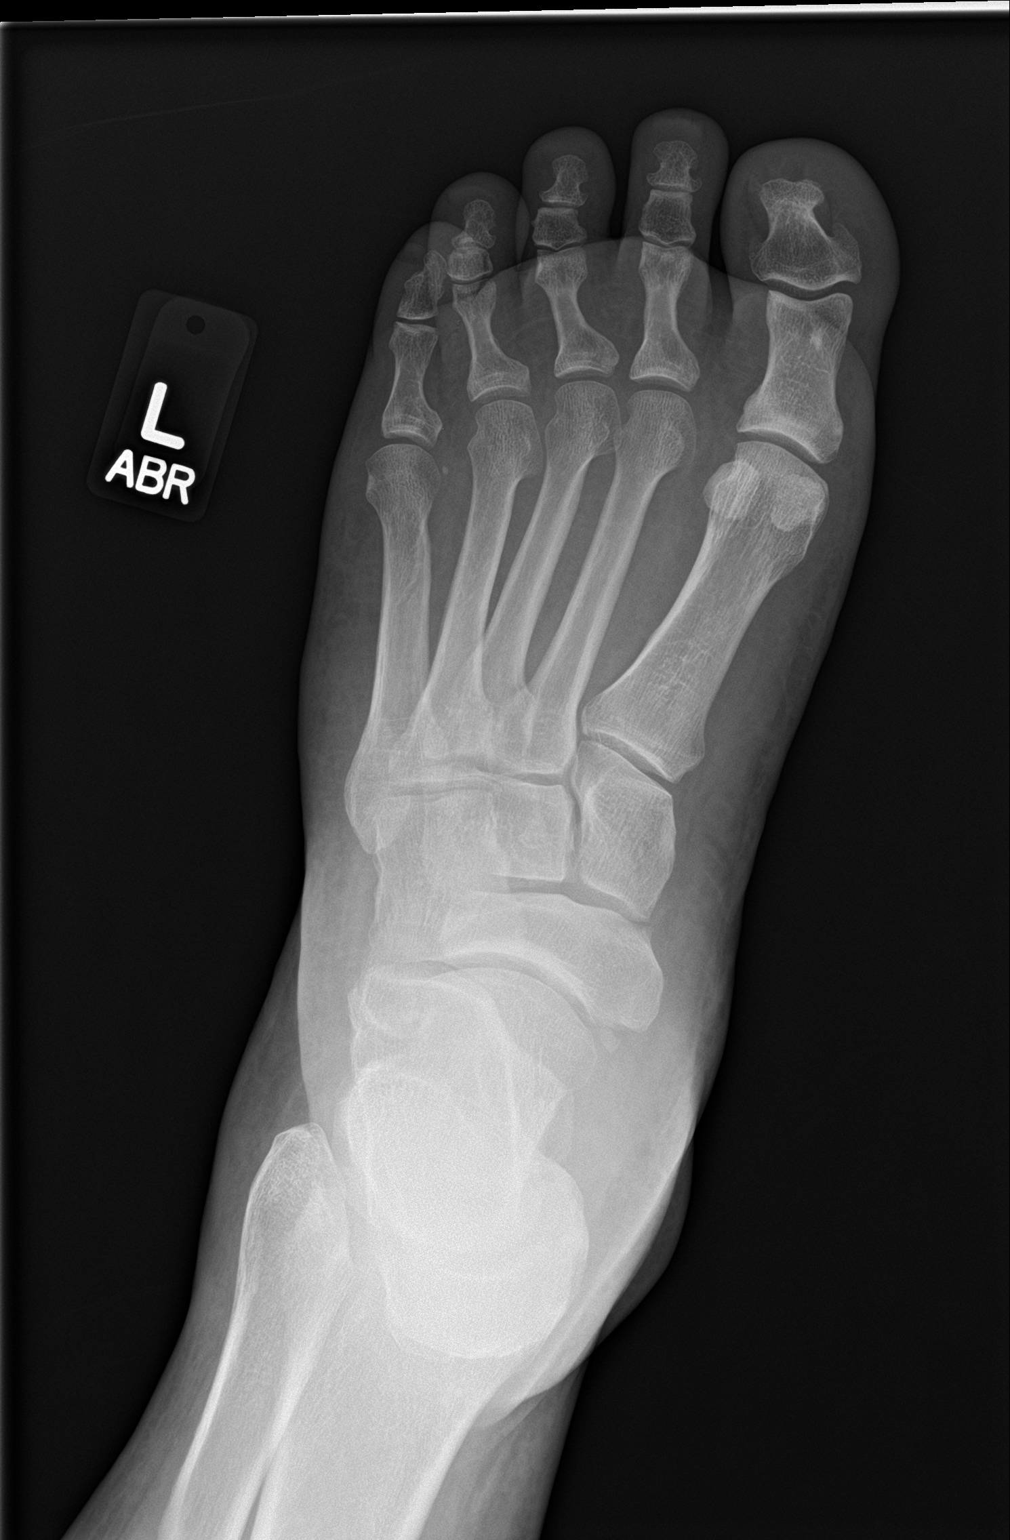

[foot obl]
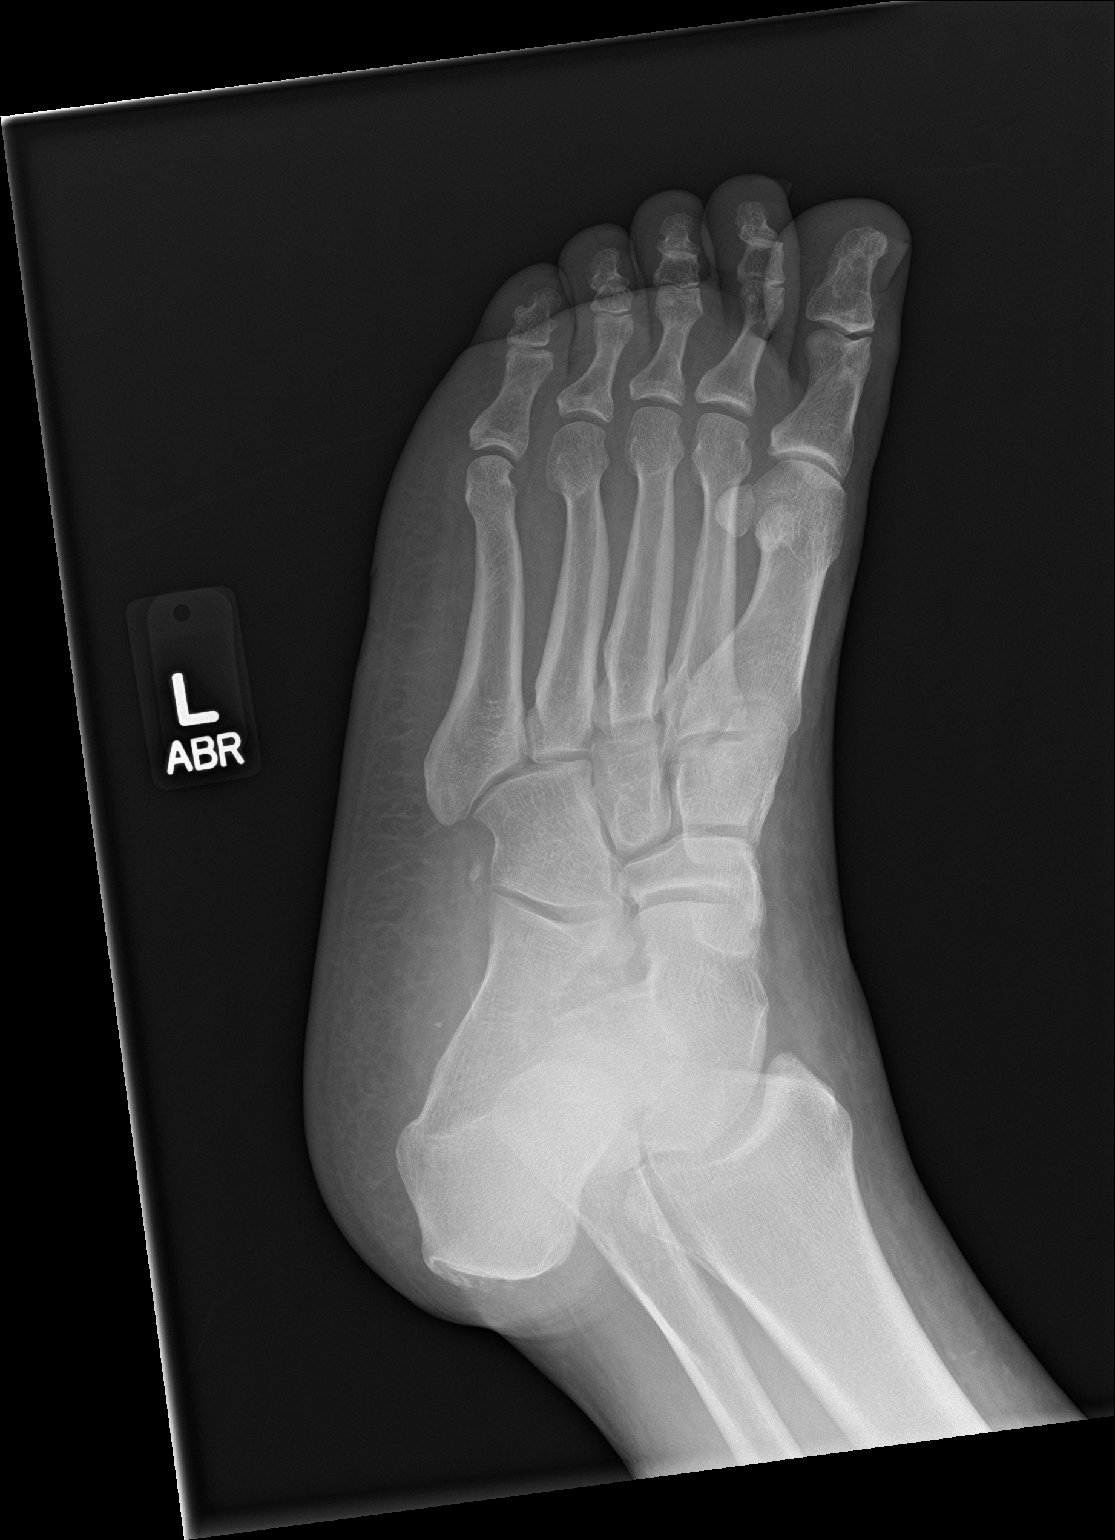

[foot lat]
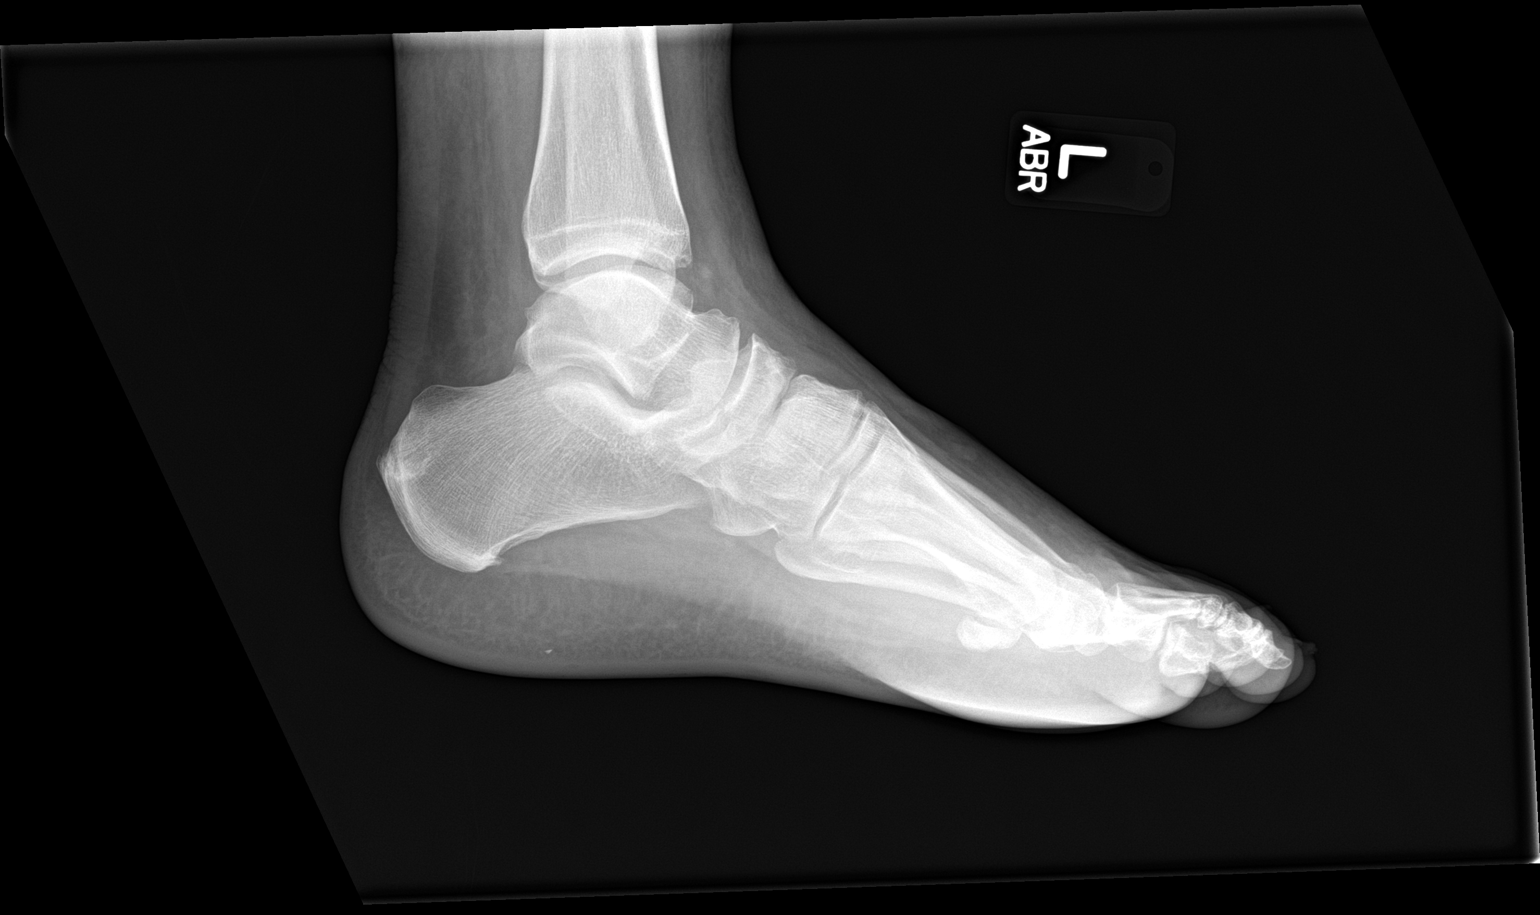

[3 of 3 positions shown; findings below may reference images not displayed]

FINDINGS: No acute fracture deformity or dislocation. No destructive bony
lesions. Os peroneum. Tiny triangular glass like foreign body
projecting within 6 mm of plantar skin surface at the level of the
mid calcaneus. No subcutaneous gas.
IMPRESSION: Tiny hindfoot plantar radiopaque foreign body, likely glass.

No acute osseous process.

## 2018-10-10 IMAGING — CR DG FOOT COMPLETE 3+V*L*
3 series · 3 of 3 positions shown · non-contrast
Comparison: Left foot radiograph dated 01/07/2017

CLINICAL DATA: 41-year-old male with history of diabetes and
hypertension presenting with left foot pain and swelling.

EXAM:
LEFT FOOT - COMPLETE 3+ VIEW

[x foot ap left]
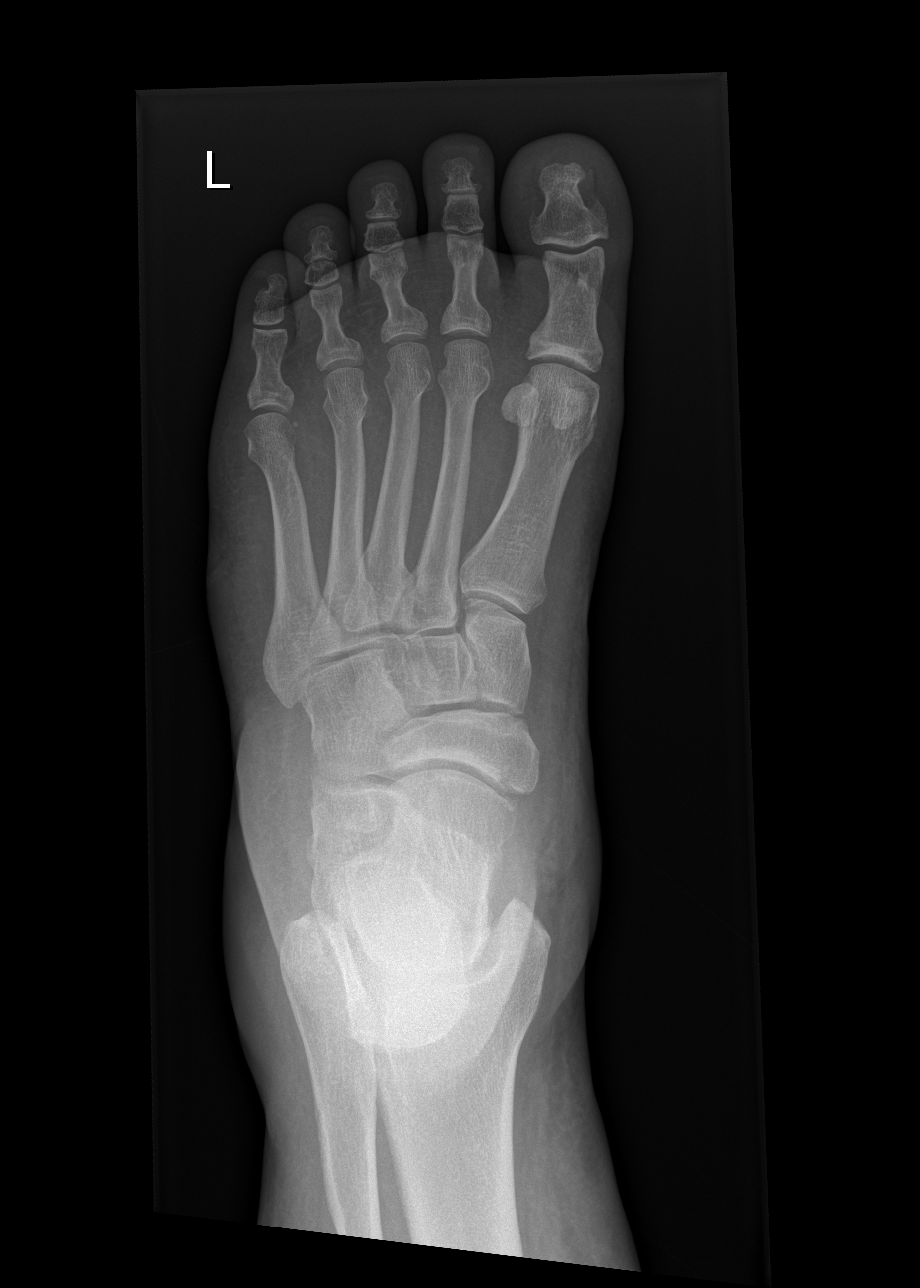

[x foot obl left]
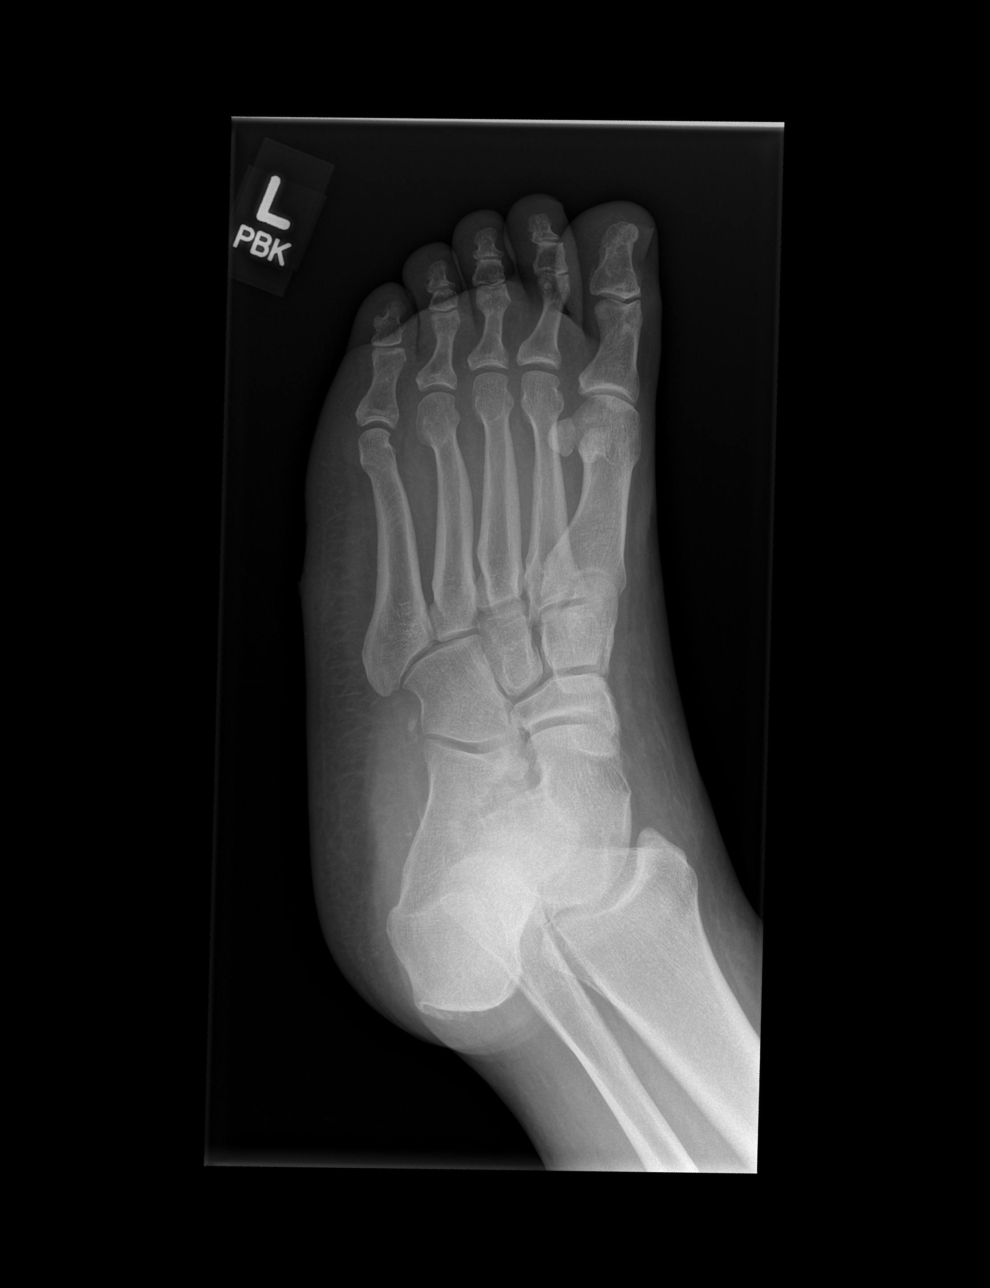

[x foot lat left]
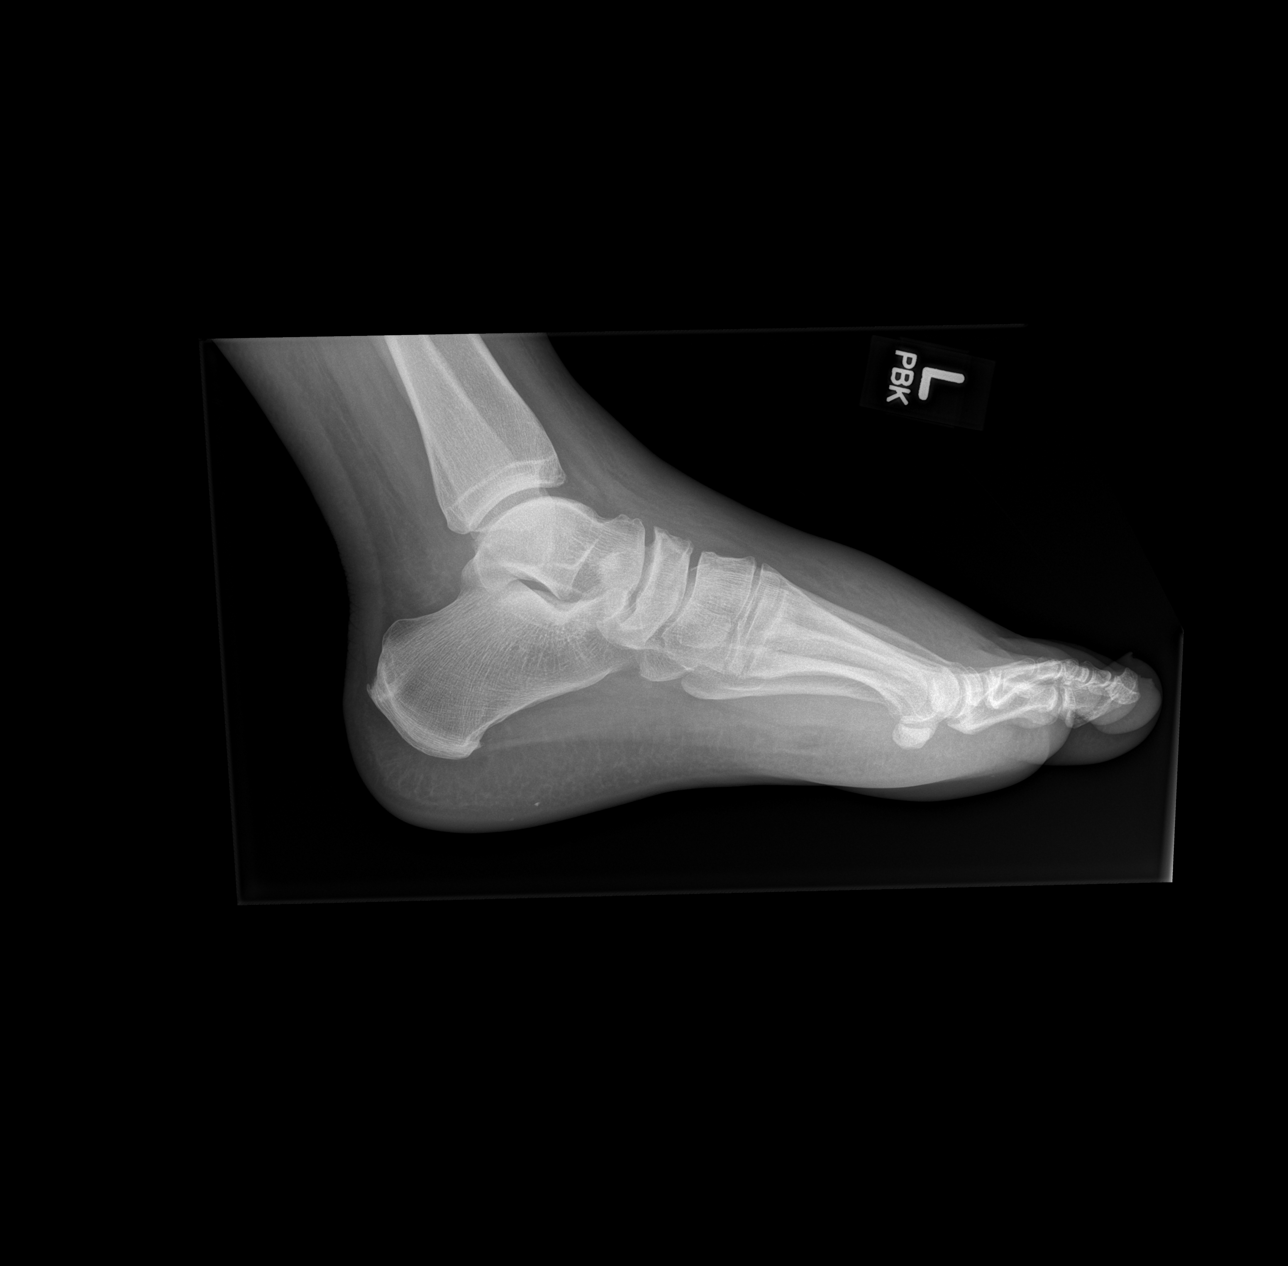

[3 of 3 positions shown; findings below may reference images not displayed]

FINDINGS: There is no acute fracture or dislocation. No significant arthritic
changes. There is diffuse soft tissue swelling of the foot primarily
over the forefoot. A tiny radiopaque focus is seen in the
superficial soft tissues of the plantar aspect of the hindfoot as
seen on the prior radiograph. There is a focal area of soft tissue
irregularity along the plantar aspect of the lateral foot seen on
the oblique view which may represent a wound or ulcer. Clinical
correlation is recommended. No soft tissue gas.
IMPRESSION: 1. No acute fracture or dislocation.
2. Diffuse soft tissue swelling of the foot primarily over the
forefoot. Clinical correlation is recommended.
3. Small radiopaque focus in the superficial soft tissues of the
plantar hindfoot similar to prior radiograph.
4. Focal skin irregularity along the lateral plantar foot may
represent an ulcer. Clinical correlation is recommended. No soft
tissue gas.

## 2018-10-10 IMAGING — CR DG HAND COMPLETE 3+V*R*
3 series · 3 of 3 positions shown · non-contrast
Comparison: 06/07/2014

CLINICAL DATA: Right hand redness, infection

EXAM:
RIGHT HAND - COMPLETE 3+ VIEW

[x hand pa right]
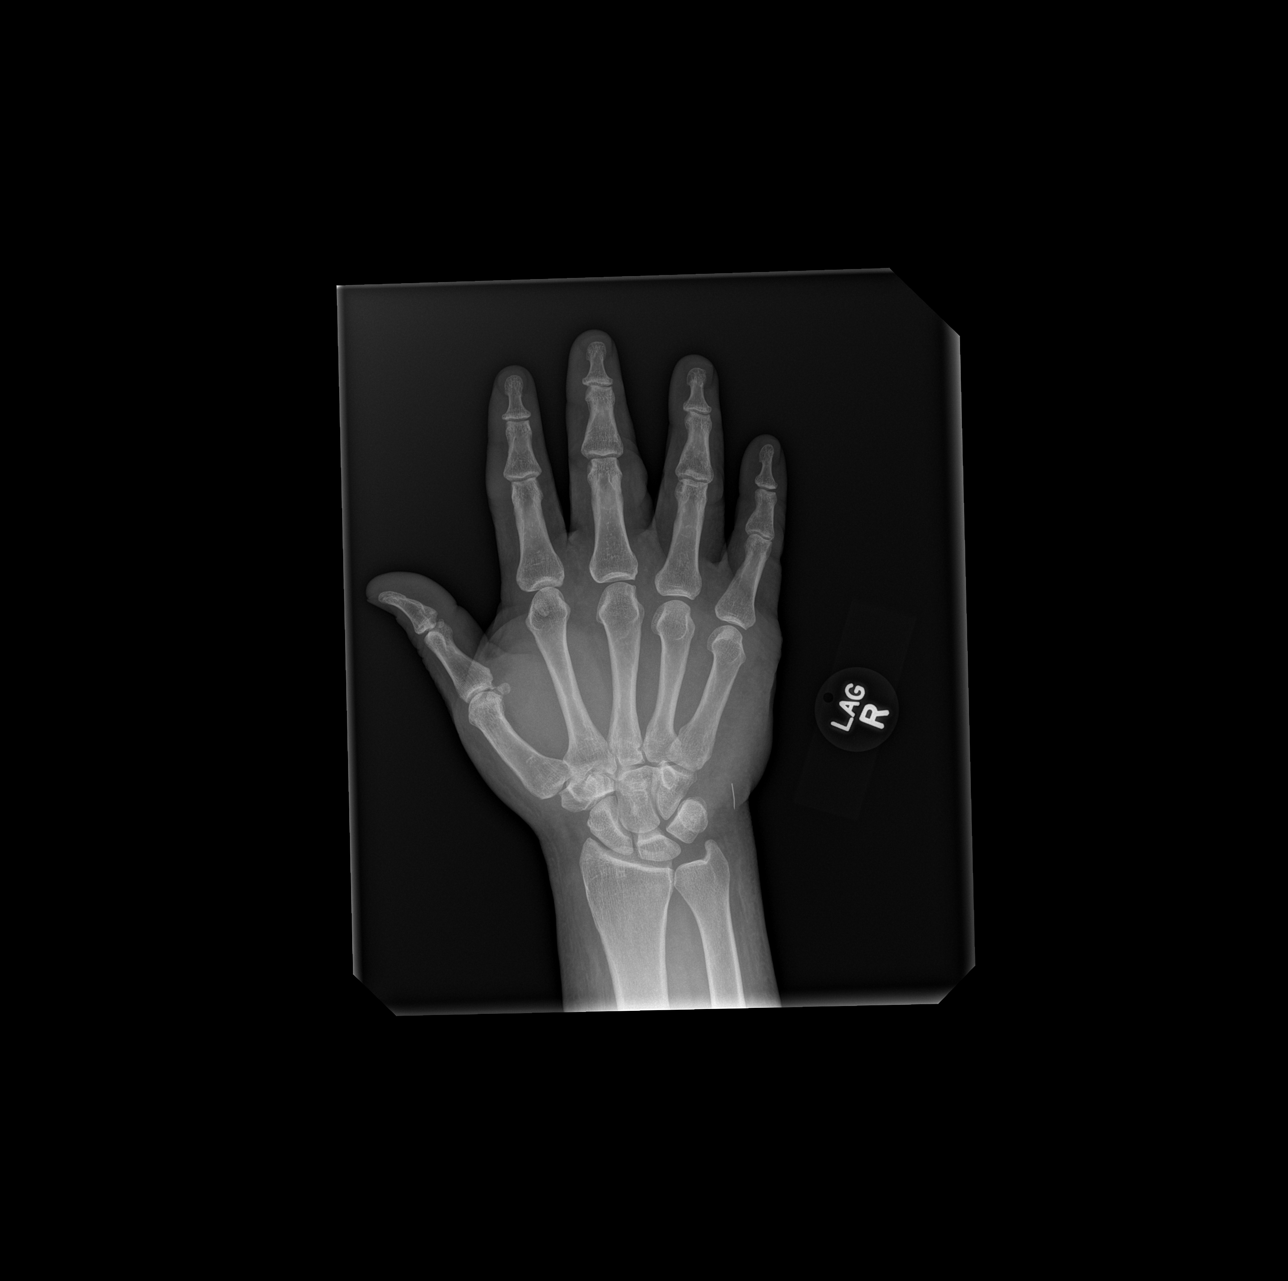

[x hand obl right]
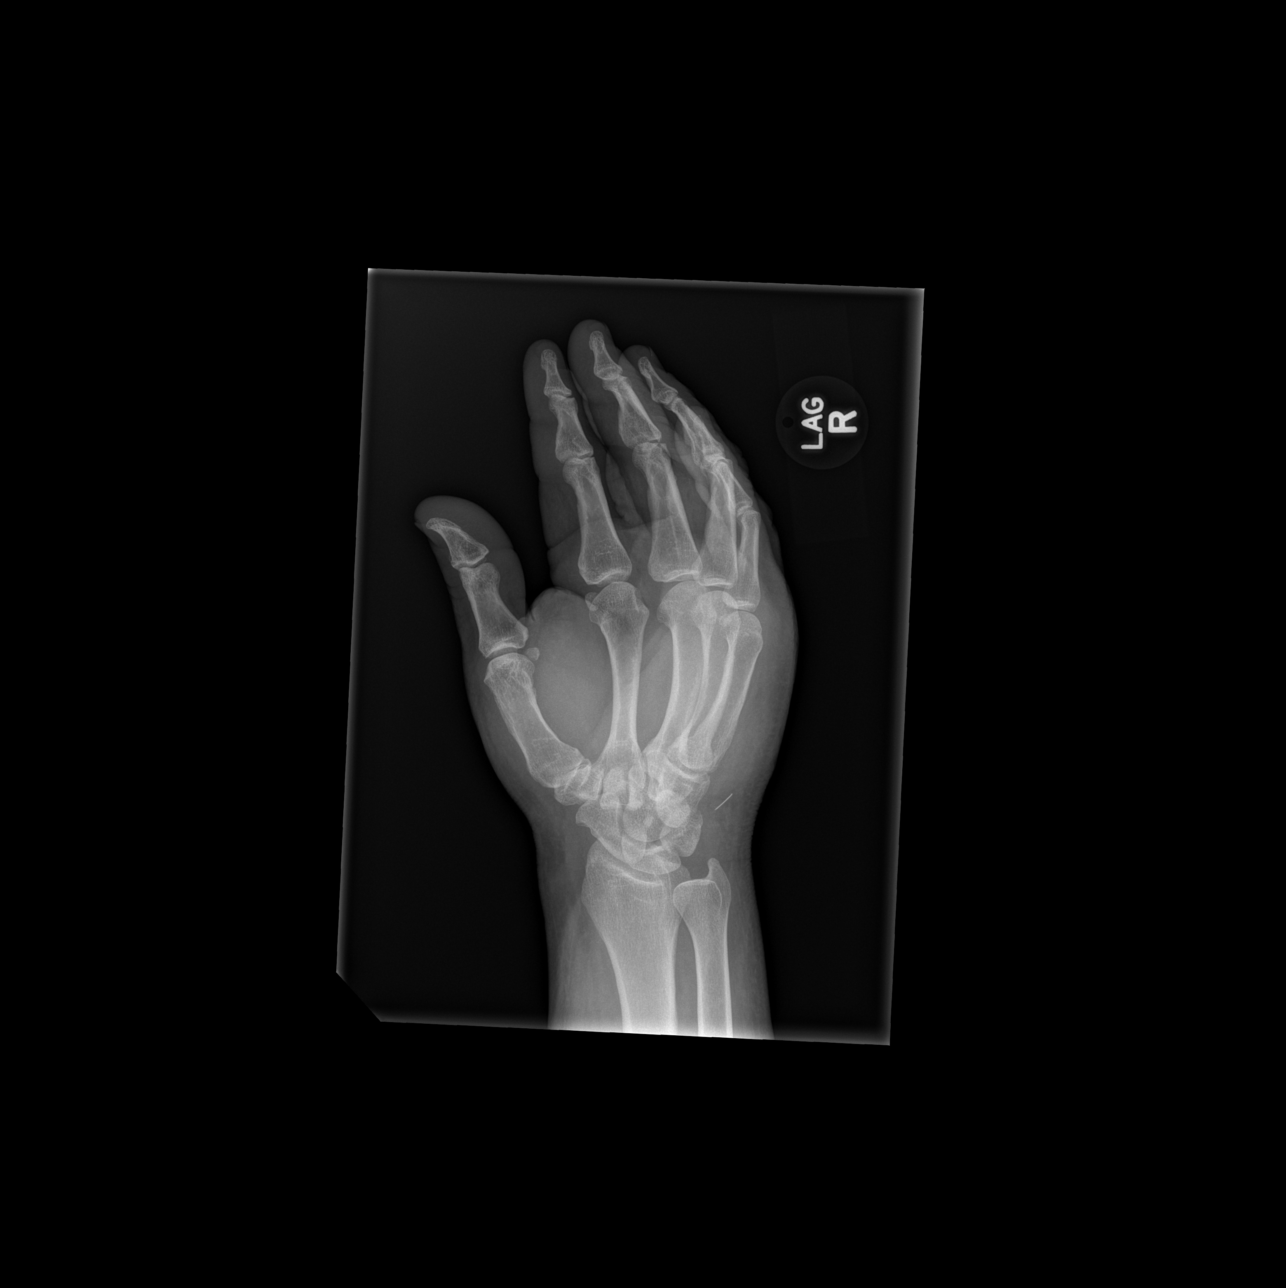

[x hand lat right]
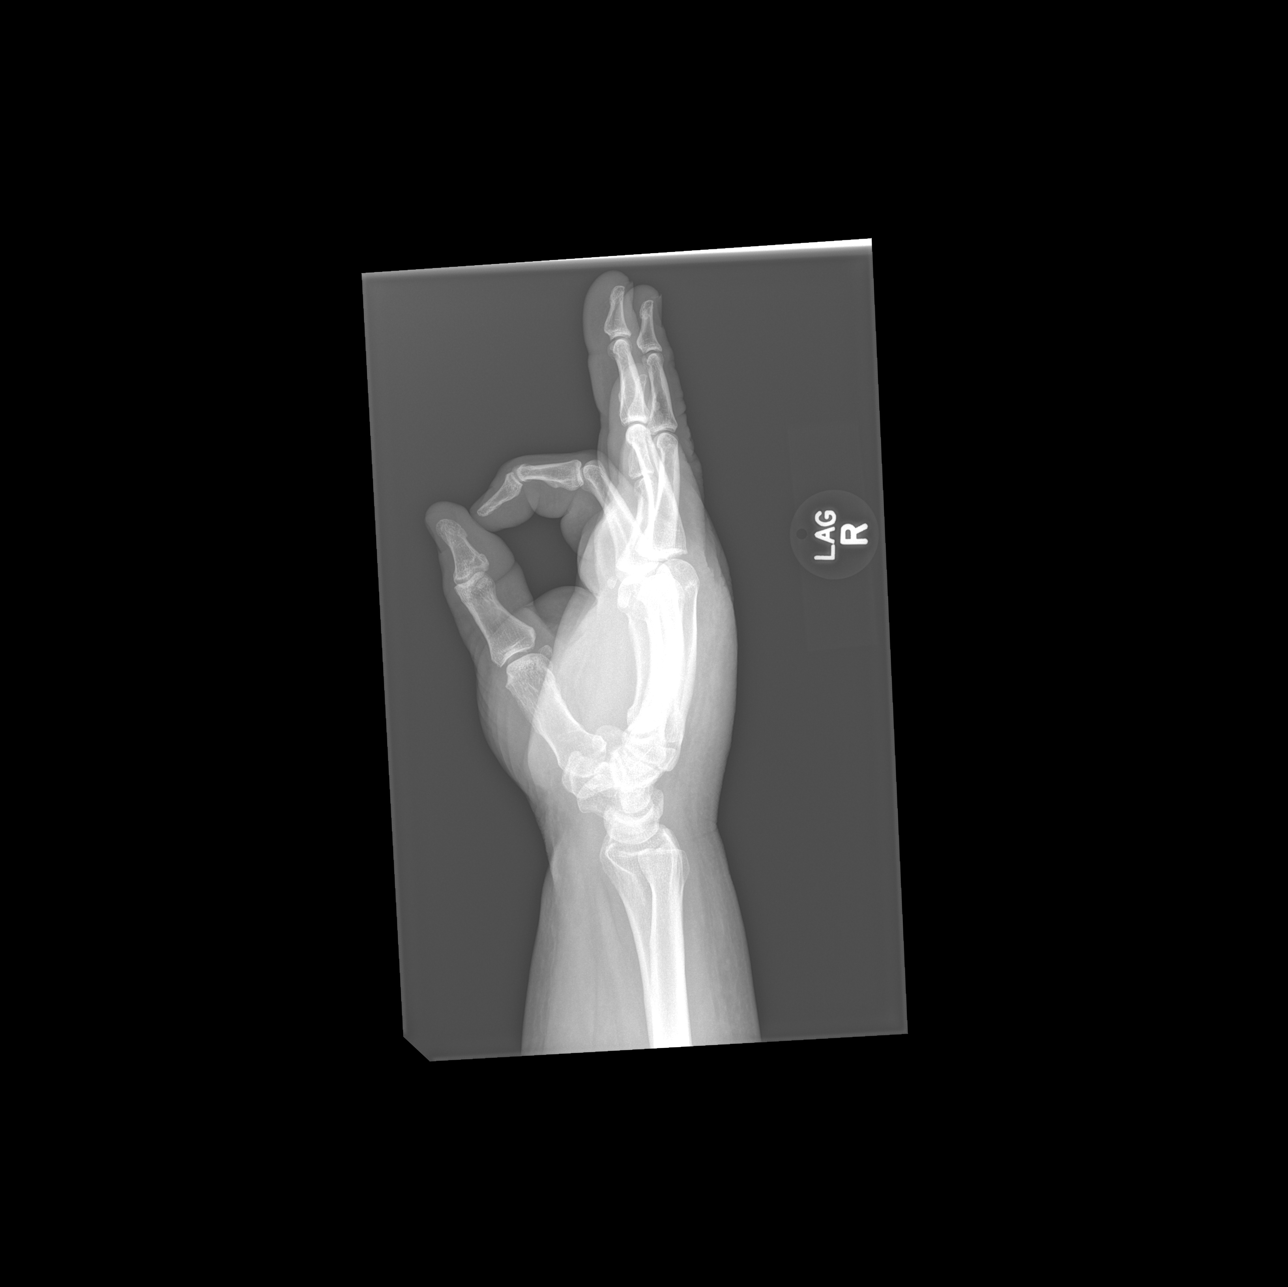

[3 of 3 positions shown; findings below may reference images not displayed]

FINDINGS: Diffuse soft tissue swelling. No fracture or malalignment. No
periostitis or bone destruction. 11 mm linear radiopaque foreign
body within the soft tissues along the ulnar aspect of the wrist.
IMPRESSION: 1. No acute osseous abnormality.
2. 11 mm linear radiopaque foreign body within the soft tissues
along the ulnar aspect of the wrist.

## 2018-11-18 ENCOUNTER — Other Ambulatory Visit: Payer: Self-pay

## 2018-11-18 ENCOUNTER — Encounter (HOSPITAL_COMMUNITY): Payer: Self-pay | Admitting: Emergency Medicine

## 2018-11-18 ENCOUNTER — Emergency Department (HOSPITAL_COMMUNITY)
Admission: EM | Admit: 2018-11-18 | Discharge: 2018-11-19 | Disposition: A | Discharge status: Home / Self Care | Payer: Medicare HMO | Attending: Emergency Medicine | Admitting: Emergency Medicine

## 2018-11-18 DIAGNOSIS — E119 Type 2 diabetes mellitus without complications: Secondary | ICD-10-CM | POA: Diagnosis not present

## 2018-11-18 DIAGNOSIS — Z7984 Long term (current) use of oral hypoglycemic drugs: Secondary | ICD-10-CM | POA: Diagnosis not present

## 2018-11-18 DIAGNOSIS — I10 Essential (primary) hypertension: Secondary | ICD-10-CM | POA: Diagnosis not present

## 2018-11-18 DIAGNOSIS — R509 Fever, unspecified: Secondary | ICD-10-CM | POA: Diagnosis not present

## 2018-11-18 DIAGNOSIS — Z5320 Procedure and treatment not carried out because of patient's decision for unspecified reasons: Secondary | ICD-10-CM | POA: Diagnosis not present

## 2018-11-18 DIAGNOSIS — R112 Nausea with vomiting, unspecified: Secondary | ICD-10-CM | POA: Diagnosis not present

## 2018-11-18 DIAGNOSIS — Z6841 Body Mass Index (BMI) 40.0 and over, adult: Secondary | ICD-10-CM | POA: Diagnosis not present

## 2018-11-18 DIAGNOSIS — L02512 Cutaneous abscess of left hand: Secondary | ICD-10-CM | POA: Diagnosis not present

## 2018-11-18 DIAGNOSIS — E669 Obesity, unspecified: Secondary | ICD-10-CM | POA: Diagnosis not present

## 2018-11-18 DIAGNOSIS — G8929 Other chronic pain: Secondary | ICD-10-CM | POA: Diagnosis not present

## 2018-11-18 DIAGNOSIS — F17210 Nicotine dependence, cigarettes, uncomplicated: Secondary | ICD-10-CM | POA: Diagnosis not present

## 2018-11-18 DIAGNOSIS — M545 Low back pain: Secondary | ICD-10-CM | POA: Diagnosis present

## 2018-11-18 DIAGNOSIS — F319 Bipolar disorder, unspecified: Secondary | ICD-10-CM | POA: Diagnosis not present

## 2018-11-18 DIAGNOSIS — Z79899 Other long term (current) drug therapy: Secondary | ICD-10-CM | POA: Diagnosis not present

## 2018-11-18 DIAGNOSIS — M5441 Lumbago with sciatica, right side: Secondary | ICD-10-CM | POA: Insufficient documentation

## 2018-11-18 MED ORDER — OXYCODONE HCL 5 MG PO TABS
15.0000 mg | ORAL_TABLET | Freq: Once | ORAL | Status: AC
  Administered 2018-11-18: 15 mg via ORAL
  Filled 2018-11-18: qty 3

## 2018-11-18 NOTE — ED Notes (Signed)
IV team at bedside 

## 2018-11-18 NOTE — ED Triage Notes (Signed)
Patient c/o back pain x3 days with N/V and fever. States hx of "spinal infection." Also c/o painful wounds to left hand. Denies urinary sx.

## 2018-11-18 NOTE — ED Notes (Signed)
Pt states he has to have an ultrasound IV due to being hard stick.

## 2018-11-19 ENCOUNTER — Emergency Department (HOSPITAL_COMMUNITY): Payer: Medicare HMO

## 2018-11-19 DIAGNOSIS — Z79899 Other long term (current) drug therapy: Secondary | ICD-10-CM | POA: Diagnosis not present

## 2018-11-19 DIAGNOSIS — Z5320 Procedure and treatment not carried out because of patient's decision for unspecified reasons: Secondary | ICD-10-CM | POA: Diagnosis not present

## 2018-11-19 DIAGNOSIS — E669 Obesity, unspecified: Secondary | ICD-10-CM | POA: Diagnosis not present

## 2018-11-19 DIAGNOSIS — Z7984 Long term (current) use of oral hypoglycemic drugs: Secondary | ICD-10-CM | POA: Diagnosis not present

## 2018-11-19 DIAGNOSIS — R112 Nausea with vomiting, unspecified: Secondary | ICD-10-CM | POA: Diagnosis not present

## 2018-11-19 DIAGNOSIS — R509 Fever, unspecified: Secondary | ICD-10-CM | POA: Diagnosis not present

## 2018-11-19 DIAGNOSIS — M545 Low back pain: Secondary | ICD-10-CM | POA: Diagnosis present

## 2018-11-19 DIAGNOSIS — E119 Type 2 diabetes mellitus without complications: Secondary | ICD-10-CM | POA: Diagnosis not present

## 2018-11-19 DIAGNOSIS — G8929 Other chronic pain: Secondary | ICD-10-CM | POA: Diagnosis not present

## 2018-11-19 DIAGNOSIS — I10 Essential (primary) hypertension: Secondary | ICD-10-CM | POA: Diagnosis not present

## 2018-11-19 DIAGNOSIS — M5441 Lumbago with sciatica, right side: Secondary | ICD-10-CM | POA: Diagnosis not present

## 2018-11-19 DIAGNOSIS — F17210 Nicotine dependence, cigarettes, uncomplicated: Secondary | ICD-10-CM | POA: Diagnosis not present

## 2018-11-19 DIAGNOSIS — L02512 Cutaneous abscess of left hand: Secondary | ICD-10-CM | POA: Diagnosis not present

## 2018-11-19 DIAGNOSIS — Z6841 Body Mass Index (BMI) 40.0 and over, adult: Secondary | ICD-10-CM | POA: Diagnosis not present

## 2018-11-19 DIAGNOSIS — F319 Bipolar disorder, unspecified: Secondary | ICD-10-CM | POA: Diagnosis not present

## 2018-11-19 LAB — CBC WITH DIFFERENTIAL/PLATELET
Abs Immature Granulocytes: 0.01 10*3/uL (ref 0.00–0.07)
Basophils Absolute: 0 10*3/uL (ref 0.0–0.1)
Basophils Relative: 0 %
Eosinophils Absolute: 0.2 10*3/uL (ref 0.0–0.5)
Eosinophils Relative: 2 %
HCT: 45.1 % (ref 39.0–52.0)
Hemoglobin: 14.9 g/dL (ref 13.0–17.0)
Immature Granulocytes: 0 %
Lymphocytes Relative: 43 %
Lymphs Abs: 4 10*3/uL (ref 0.7–4.0)
MCH: 30 pg (ref 26.0–34.0)
MCHC: 33 g/dL (ref 30.0–36.0)
MCV: 90.9 fL (ref 80.0–100.0)
Monocytes Absolute: 0.6 10*3/uL (ref 0.1–1.0)
Monocytes Relative: 7 %
Neutro Abs: 4.5 10*3/uL (ref 1.7–7.7)
Neutrophils Relative %: 48 %
Platelets: 185 10*3/uL (ref 150–400)
RBC: 4.96 MIL/uL (ref 4.22–5.81)
RDW: 12.5 % (ref 11.5–15.5)
WBC: 9.4 10*3/uL (ref 4.0–10.5)
nRBC: 0 % (ref 0.0–0.2)

## 2018-11-19 LAB — URINALYSIS, ROUTINE W REFLEX MICROSCOPIC
Bilirubin Urine: NEGATIVE
Glucose, UA: NEGATIVE mg/dL
Hgb urine dipstick: NEGATIVE
Ketones, ur: NEGATIVE mg/dL
Leukocytes,Ua: NEGATIVE
Nitrite: NEGATIVE
Protein, ur: NEGATIVE mg/dL
Specific Gravity, Urine: 1.015 (ref 1.005–1.030)
pH: 5 (ref 5.0–8.0)

## 2018-11-19 LAB — COMPREHENSIVE METABOLIC PANEL
ALT: 16 U/L (ref 0–44)
AST: 17 U/L (ref 15–41)
Albumin: 3.6 g/dL (ref 3.5–5.0)
Alkaline Phosphatase: 51 U/L (ref 38–126)
Anion gap: 10 (ref 5–15)
BUN: 12 mg/dL (ref 6–20)
CO2: 26 mmol/L (ref 22–32)
Calcium: 8.8 mg/dL — ABNORMAL LOW (ref 8.9–10.3)
Chloride: 100 mmol/L (ref 98–111)
Creatinine, Ser: 0.79 mg/dL (ref 0.61–1.24)
GFR calc Af Amer: >60 mL/min (ref >60)
GFR calc non Af Amer: >60 mL/min (ref >60)
Glucose, Bld: 106 mg/dL — ABNORMAL HIGH (ref 70–99)
Potassium: 4 mmol/L (ref 3.5–5.1)
Sodium: 136 mmol/L (ref 135–145)
Total Bilirubin: 0.6 mg/dL (ref 0.3–1.2)
Total Protein: 7.3 g/dL (ref 6.5–8.1)

## 2018-11-19 LAB — LACTIC ACID, PLASMA: Lactic Acid, Venous: 0.8 mmol/L (ref 0.5–1.9)

## 2018-11-19 LAB — CBG MONITORING, ED: Glucose-Capillary: 108 mg/dL — ABNORMAL HIGH (ref 70–99)

## 2018-11-19 MED ORDER — DOXYCYCLINE HYCLATE 100 MG PO CAPS: 100.0000 mg | ORAL_CAPSULE | Freq: Two times a day (BID) | ORAL | 0 refills | Status: AC

## 2018-11-19 MED ORDER — SODIUM CHLORIDE 0.9% FLUSH
10.0000 mL | INTRAVENOUS | Status: DC | PRN
  Administered 2018-11-19: 02:00:00 10 mL
  Filled 2018-11-19: qty 40

## 2018-11-19 MED ORDER — FENTANYL CITRATE (PF) 100 MCG/2ML IJ SOLN
100.0000 ug | Freq: Once | INTRAMUSCULAR | Status: AC
  Administered 2018-11-19: 100 ug via INTRAVENOUS
  Filled 2018-11-19: qty 2

## 2018-11-19 MED ORDER — FENTANYL CITRATE (PF) 100 MCG/2ML IJ SOLN
100.0000 ug | Freq: Once | INTRAMUSCULAR | Status: AC
  Administered 2018-11-19: 10:00:00 100 ug via INTRAVENOUS
  Filled 2018-11-19: qty 2

## 2018-11-19 MED ORDER — LORAZEPAM 2 MG/ML IJ SOLN: 1.0000 mg | Freq: Once | INTRAMUSCULAR | Status: DC | PRN

## 2018-11-19 NOTE — ED Notes (Signed)
Patient provided graham crackers and peanut butter. Denies any other needs at this time. Will continue to monitor patient.
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
## 2018-11-19 NOTE — Discharge Instructions (Addendum)
I have prescribed antibiotics to help with your left hand infection, please take 1 tablet twice a day for the next 7 days.  If you experience any urinary retention, bowel incontinence please return to the emergency department.

## 2018-11-19 NOTE — ED Provider Notes (Signed)
Patient transported for [facility] long for MRI lumbar spine as he was seen at [facility] long for low back pain for the past 4 days without injury.  .  Patient arrived in the ED with stable vital signs, afebrile.  He reports his back pain is coming from a fall from a couple days ago.  He reports he was mostly concerned about his left hand pain.  Advised the patient that he was transferred to [facility] in order to have a MRI of his lumbar spine, patient refuses this at this time and reports he does not fit on our machine as he is had an MRI last time in 2019.  He voices concern for his left hand pain, x-ray was obtained which showed:  1. Moderate to severe soft tissue swelling of the left hand.  2. No acute osseous abnormality.      3:19 PM patient's x-ray results are within normal limits, will prescribe a course of doxycycline to treat his infection.  He is aware he is leaving [facility] as he does not wish to obtain MRI lumbar spine today.  Return precautions discussed.  Portions of this note were generated with [employer]. Dictation errors may occur despite best attempts at proofreading.      Janeece Fitting, PA-C 11/19/18 1520    Maudie Flakes, MD 11/24/18 1535

## 2018-11-19 NOTE — ED Notes (Addendum)
REPORT CALLED TO JAMIE RN AT [facility] ED. CARE LINK NOTIFIED FOR TRANSPORT.

## 2018-11-19 NOTE — ED Notes (Signed)
Pt arrived via [facility] from [facility] for MRI

## 2018-11-19 NOTE — ED Notes (Signed)
Pt left AMA, stated he did not want MRI. Midline removed. Discharge paperwork and prescriptions reviewed.

## 2018-11-19 NOTE — ED Notes (Signed)
Patient reminded that urine sample is needed. Urinal at bedside.  

## 2018-11-19 NOTE — ED Provider Notes (Signed)
Care assumed from previous provider PA [place]. Please see note for further details. Case discussed, plan agreed upon. Briefly, patient is a 43 y.o. male who presented to ED for low back pain. Hx of substance abuse with concern for epidural abscess. Will follow up on pending MR thoracic and lumbar spine.   MRI technician at [facility] has evaluated patient and does not feel he is safe for MRI machine here due to weight and girth. Recommended transfer to [facility] for MRI as he has had scan in larger machine there before. Discussed with EDP at [facility], Dr. Sedonia Small, who accepts transfer.   Patient discussed with Dr. Billy Fischer who agrees with treatment plan.      Ward, Ozella Almond, PA-C 11/19/18 1043    Gareth Morgan, MD 11/23/18 1115

## 2018-11-19 NOTE — ED Notes (Signed)
MRI CALLED @ 09.00

## 2018-11-20 LAB — CBG MONITORING, ED: Glucose-Capillary: 101 mg/dL — ABNORMAL HIGH (ref 70–99)

## 2018-12-06 ENCOUNTER — Emergency Department (HOSPITAL_COMMUNITY)
Admission: EM | Admit: 2018-12-06 | Discharge: 2018-12-06 | Disposition: A | Discharge status: Other Acute Inpatient Hospital | Payer: Medicare HMO | Attending: Emergency Medicine | Admitting: Emergency Medicine

## 2018-12-06 ENCOUNTER — Encounter (HOSPITAL_COMMUNITY): Payer: Self-pay

## 2018-12-06 ENCOUNTER — Other Ambulatory Visit: Payer: Self-pay

## 2018-12-06 ENCOUNTER — Inpatient Hospital Stay (HOSPITAL_COMMUNITY)
Admission: AD | Admit: 2018-12-06 | Discharge: 2018-12-07 | DRG: 885 | Disposition: A | Discharge status: Home / Self Care | Payer: Medicare HMO | Source: Intra-hospital | Attending: Psychiatry | Admitting: Psychiatry

## 2018-12-06 DIAGNOSIS — F0391 Unspecified dementia with behavioral disturbance: Secondary | ICD-10-CM | POA: Diagnosis present

## 2018-12-06 DIAGNOSIS — K219 Gastro-esophageal reflux disease without esophagitis: Secondary | ICD-10-CM | POA: Diagnosis present

## 2018-12-06 DIAGNOSIS — Z823 Family history of stroke: Secondary | ICD-10-CM

## 2018-12-06 DIAGNOSIS — F988 Other specified behavioral and emotional disorders with onset usually occurring in childhood and adolescence: Secondary | ICD-10-CM | POA: Diagnosis present

## 2018-12-06 DIAGNOSIS — F332 Major depressive disorder, recurrent severe without psychotic features: Secondary | ICD-10-CM | POA: Diagnosis present

## 2018-12-06 DIAGNOSIS — T1491XA Suicide attempt, initial encounter: Secondary | ICD-10-CM

## 2018-12-06 DIAGNOSIS — E119 Type 2 diabetes mellitus without complications: Secondary | ICD-10-CM | POA: Diagnosis not present

## 2018-12-06 DIAGNOSIS — Z888 Allergy status to other drugs, medicaments and biological substances status: Secondary | ICD-10-CM | POA: Diagnosis not present

## 2018-12-06 DIAGNOSIS — Z590 Homelessness: Secondary | ICD-10-CM

## 2018-12-06 DIAGNOSIS — Z8249 Family history of ischemic heart disease and other diseases of the circulatory system: Secondary | ICD-10-CM | POA: Diagnosis not present

## 2018-12-06 DIAGNOSIS — I10 Essential (primary) hypertension: Secondary | ICD-10-CM | POA: Diagnosis present

## 2018-12-06 DIAGNOSIS — E669 Obesity, unspecified: Secondary | ICD-10-CM | POA: Diagnosis present

## 2018-12-06 DIAGNOSIS — Z7984 Long term (current) use of oral hypoglycemic drugs: Secondary | ICD-10-CM | POA: Diagnosis not present

## 2018-12-06 DIAGNOSIS — E785 Hyperlipidemia, unspecified: Secondary | ICD-10-CM | POA: Diagnosis present

## 2018-12-06 DIAGNOSIS — F319 Bipolar disorder, unspecified: Secondary | ICD-10-CM | POA: Diagnosis not present

## 2018-12-06 DIAGNOSIS — Z20828 Contact with and (suspected) exposure to other viral communicable diseases: Secondary | ICD-10-CM | POA: Diagnosis not present

## 2018-12-06 DIAGNOSIS — Z8673 Personal history of transient ischemic attack (TIA), and cerebral infarction without residual deficits: Secondary | ICD-10-CM | POA: Insufficient documentation

## 2018-12-06 DIAGNOSIS — R45851 Suicidal ideations: Secondary | ICD-10-CM | POA: Insufficient documentation

## 2018-12-06 DIAGNOSIS — Z833 Family history of diabetes mellitus: Secondary | ICD-10-CM

## 2018-12-06 DIAGNOSIS — Z818 Family history of other mental and behavioral disorders: Secondary | ICD-10-CM

## 2018-12-06 DIAGNOSIS — F1010 Alcohol abuse, uncomplicated: Secondary | ICD-10-CM | POA: Diagnosis present

## 2018-12-06 DIAGNOSIS — Z886 Allergy status to analgesic agent status: Secondary | ICD-10-CM

## 2018-12-06 DIAGNOSIS — Z6841 Body Mass Index (BMI) 40.0 and over, adult: Secondary | ICD-10-CM | POA: Diagnosis not present

## 2018-12-06 DIAGNOSIS — J45909 Unspecified asthma, uncomplicated: Secondary | ICD-10-CM | POA: Insufficient documentation

## 2018-12-06 DIAGNOSIS — F17210 Nicotine dependence, cigarettes, uncomplicated: Secondary | ICD-10-CM | POA: Diagnosis present

## 2018-12-06 DIAGNOSIS — Z79899 Other long term (current) drug therapy: Secondary | ICD-10-CM | POA: Diagnosis not present

## 2018-12-06 DIAGNOSIS — Z915 Personal history of self-harm: Secondary | ICD-10-CM

## 2018-12-06 DIAGNOSIS — F329 Major depressive disorder, single episode, unspecified: Secondary | ICD-10-CM | POA: Diagnosis present

## 2018-12-06 LAB — COMPREHENSIVE METABOLIC PANEL
ALT: 22 U/L (ref 0–44)
AST: 25 U/L (ref 15–41)
Albumin: 4 g/dL (ref 3.5–5.0)
Alkaline Phosphatase: 63 U/L (ref 38–126)
Anion gap: 12 (ref 5–15)
BUN: 8 mg/dL (ref 6–20)
CO2: 25 mmol/L (ref 22–32)
Calcium: 9.2 mg/dL (ref 8.9–10.3)
Chloride: 104 mmol/L (ref 98–111)
Creatinine, Ser: 0.88 mg/dL (ref 0.61–1.24)
GFR calc Af Amer: >60 mL/min (ref >60)
GFR calc non Af Amer: >60 mL/min (ref >60)
Glucose, Bld: 167 mg/dL — ABNORMAL HIGH (ref 70–99)
Potassium: 4.2 mmol/L (ref 3.5–5.1)
Sodium: 141 mmol/L (ref 135–145)
Total Bilirubin: 0.3 mg/dL (ref 0.3–1.2)
Total Protein: 8.3 g/dL — ABNORMAL HIGH (ref 6.5–8.1)

## 2018-12-06 LAB — CBC WITH DIFFERENTIAL/PLATELET
Abs Immature Granulocytes: 0.04 10*3/uL (ref 0.00–0.07)
Basophils Absolute: 0 10*3/uL (ref 0.0–0.1)
Basophils Relative: 0 %
Eosinophils Absolute: 0.2 10*3/uL (ref 0.0–0.5)
Eosinophils Relative: 2 %
HCT: 50.4 % (ref 39.0–52.0)
Hemoglobin: 16.6 g/dL (ref 13.0–17.0)
Immature Granulocytes: 0 %
Lymphocytes Relative: 45 %
Lymphs Abs: 4.6 10*3/uL — ABNORMAL HIGH (ref 0.7–4.0)
MCH: 30 pg (ref 26.0–34.0)
MCHC: 32.9 g/dL (ref 30.0–36.0)
MCV: 91.1 fL (ref 80.0–100.0)
Monocytes Absolute: 0.6 10*3/uL (ref 0.1–1.0)
Monocytes Relative: 6 %
Neutro Abs: 4.8 10*3/uL (ref 1.7–7.7)
Neutrophils Relative %: 47 %
Platelets: 240 10*3/uL (ref 150–400)
RBC: 5.53 MIL/uL (ref 4.22–5.81)
RDW: 12.6 % (ref 11.5–15.5)
WBC: 10.2 10*3/uL (ref 4.0–10.5)
nRBC: 0 % (ref 0.0–0.2)

## 2018-12-06 LAB — RAPID URINE DRUG SCREEN, HOSP PERFORMED
Amphetamines: POSITIVE — AB
Barbiturates: NOT DETECTED
Benzodiazepines: POSITIVE — AB
Cocaine: NOT DETECTED
Opiates: POSITIVE — AB
Tetrahydrocannabinol: NOT DETECTED

## 2018-12-06 LAB — SARS CORONAVIRUS 2 BY RT PCR (HOSPITAL ORDER, PERFORMED IN ~~LOC~~ HOSPITAL LAB): SARS Coronavirus 2: NEGATIVE

## 2018-12-06 LAB — ETHANOL: Alcohol, Ethyl (B): 150 mg/dL — ABNORMAL HIGH (ref <10)

## 2018-12-06 LAB — ACETAMINOPHEN LEVEL: Acetaminophen (Tylenol), Serum: <10 ug/mL — ABNORMAL LOW (ref 10–30)

## 2018-12-06 LAB — SALICYLATE LEVEL: Salicylate Lvl: <7 mg/dL (ref 2.8–30.0)

## 2018-12-06 LAB — CBG MONITORING, ED: Glucose-Capillary: 185 mg/dL — ABNORMAL HIGH (ref 70–99)

## 2018-12-06 MED ORDER — ZIPRASIDONE MESYLATE 20 MG IM SOLR
20.0000 mg | Freq: Once | INTRAMUSCULAR | Status: DC
  Filled 2018-12-06: qty 20

## 2018-12-06 MED ORDER — TRAZODONE HCL 150 MG PO TABS
150.0000 mg | ORAL_TABLET | Freq: Every evening | ORAL | Status: DC | PRN
  Filled 2018-12-06: qty 1

## 2018-12-06 MED ORDER — LORAZEPAM 2 MG/ML IJ SOLN: 2.0000 mg | Freq: Four times a day (QID) | INTRAMUSCULAR | Status: DC | PRN

## 2018-12-06 MED ORDER — DIPHENHYDRAMINE HCL 25 MG PO CAPS: 50.0000 mg | ORAL_CAPSULE | Freq: Four times a day (QID) | ORAL | Status: DC | PRN

## 2018-12-06 MED ORDER — OLANZAPINE 10 MG PO TBDP
10.0000 mg | ORAL_TABLET | Freq: Three times a day (TID) | ORAL | Status: DC | PRN
  Filled 2018-12-06: qty 1

## 2018-12-06 MED ORDER — MAGNESIUM HYDROXIDE 400 MG/5ML PO SUSP: 30.0000 mL | Freq: Every day | ORAL | Status: DC | PRN

## 2018-12-06 MED ORDER — ZIPRASIDONE MESYLATE 20 MG IM SOLR: 20.0000 mg | INTRAMUSCULAR | Status: DC | PRN

## 2018-12-06 MED ORDER — ATORVASTATIN CALCIUM 20 MG PO TABS: 20.0000 mg | ORAL_TABLET | Freq: Every day | ORAL | Status: DC

## 2018-12-06 MED ORDER — ZIPRASIDONE HCL 40 MG PO CAPS
40.0000 mg | ORAL_CAPSULE | Freq: Two times a day (BID) | ORAL | Status: DC
  Administered 2018-12-06 – 2018-12-07 (×2): 40 mg via ORAL
  Filled 2018-12-06 (×8): qty 1

## 2018-12-06 MED ORDER — DIPHENHYDRAMINE HCL 50 MG/ML IJ SOLN: 50.0000 mg | Freq: Four times a day (QID) | INTRAMUSCULAR | Status: DC | PRN

## 2018-12-06 MED ORDER — LORAZEPAM 1 MG PO TABS: 2.0000 mg | ORAL_TABLET | Freq: Four times a day (QID) | ORAL | Status: DC | PRN

## 2018-12-06 MED ORDER — ALUM & MAG HYDROXIDE-SIMETH 200-200-20 MG/5ML PO SUSP: 30.0000 mL | ORAL | Status: DC | PRN

## 2018-12-06 MED ORDER — METFORMIN HCL 500 MG PO TABS
1000.0000 mg | ORAL_TABLET | Freq: Every day | ORAL | Status: DC
  Administered 2018-12-07: 1000 mg via ORAL
  Filled 2018-12-06 (×4): qty 2

## 2018-12-06 MED ORDER — CLONIDINE HCL 0.2 MG PO TABS
0.2000 mg | ORAL_TABLET | Freq: Two times a day (BID) | ORAL | Status: DC
  Administered 2018-12-06 – 2018-12-07 (×2): 0.2 mg via ORAL
  Filled 2018-12-06: qty 1
  Filled 2018-12-06: qty 2
  Filled 2018-12-06 (×3): qty 1
  Filled 2018-12-06: qty 2
  Filled 2018-12-06 (×2): qty 1

## 2018-12-06 MED ORDER — HYDROCHLOROTHIAZIDE 25 MG PO TABS: 25.0000 mg | ORAL_TABLET | Freq: Every day | ORAL | Status: DC

## 2018-12-06 MED ORDER — GABAPENTIN 400 MG PO CAPS
800.0000 mg | ORAL_CAPSULE | Freq: Three times a day (TID) | ORAL | Status: DC
  Administered 2018-12-06 – 2018-12-07 (×2): 800 mg via ORAL
  Filled 2018-12-06 (×10): qty 2

## 2018-12-06 MED ORDER — HYDROCHLOROTHIAZIDE 25 MG PO TABS
25.0000 mg | ORAL_TABLET | Freq: Every day | ORAL | Status: DC
  Administered 2018-12-06 – 2018-12-07 (×2): 25 mg via ORAL
  Filled 2018-12-06 (×6): qty 1

## 2018-12-06 MED ORDER — HALOPERIDOL 5 MG PO TABS: 5.0000 mg | ORAL_TABLET | Freq: Four times a day (QID) | ORAL | Status: DC | PRN

## 2018-12-06 MED ORDER — ZIPRASIDONE MESYLATE 20 MG IM SOLR
20.0000 mg | Freq: Once | INTRAMUSCULAR | Status: AC
  Administered 2018-12-06: 20 mg via INTRAMUSCULAR

## 2018-12-06 MED ORDER — HALOPERIDOL LACTATE 5 MG/ML IJ SOLN: 5.0000 mg | Freq: Four times a day (QID) | INTRAMUSCULAR | Status: DC | PRN

## 2018-12-06 MED ORDER — LINAGLIPTIN 5 MG PO TABS
5.0000 mg | ORAL_TABLET | Freq: Every day | ORAL | Status: DC
  Administered 2018-12-06 – 2018-12-07 (×2): 5 mg via ORAL
  Filled 2018-12-06 (×6): qty 1

## 2018-12-06 MED ORDER — PANTOPRAZOLE SODIUM 40 MG PO TBEC
40.0000 mg | DELAYED_RELEASE_TABLET | Freq: Every day | ORAL | Status: DC
  Administered 2018-12-06 – 2018-12-07 (×2): 40 mg via ORAL
  Filled 2018-12-06 (×6): qty 1

## 2018-12-06 MED ORDER — LORAZEPAM 1 MG PO TABS
1.0000 mg | ORAL_TABLET | ORAL | Status: AC | PRN
  Administered 2018-12-07: 1 mg via ORAL
  Filled 2018-12-06 (×2): qty 1

## 2018-12-06 NOTE — ED Notes (Signed)
Officer Nancie Neas from the [place] up patient at this time.

## 2018-12-06 NOTE — BHH Counselor (Signed)
Per Bryan Gallegos at [facility] ED patient continues to be sedated.

## 2018-12-06 NOTE — ED Notes (Addendum)
,   while being verbally combative with [employer], and [facility], pt looked at this writer and stated "stop laughing at me. Ain't shit funny you fucking n word

## 2018-12-06 NOTE — ED Notes (Signed)
RN spoke with Margarita Grizzle with TTS and was informed that patient has bed ready and that they are ready to take patient to [facility].  Room 301, Bed-2 [phone]
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
## 2018-12-06 NOTE — Consult Note (Signed)
  Tele psych Assessment   Bryan Gallegos, 43 y.o., male patient seen via tele psych by TTS and this provider; chart reviewed and consulted with Dr. Dwyane Dee on 12/06/18.  On evaluation Bryan Gallegos states that he was intoxicated and did things that he doesn't normally do like putting pills in his mouth and then spitting out.  Patient states that he son passed away this past 07/01/22 and that the son that past is the person that he was closest to.  States that his other sons girlfriend was talking bad about the son that passed away.  Patient continue to minimize his suicidal action by putting multiple pills in his mouth and then spitting out when family came in room.    For detailed note see TTS tele assessment note  Recommendations:  Inpatient psychiatric treatment  Disposition:   Recommend psychiatric Inpatient admission when medically cleared.   Earleen Newport, NP
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
## 2018-12-06 NOTE — ED Notes (Signed)
RN has attempted to call [facility] 2x with no answer. RN let phone ring until phone disconnected from number automatically. Charlena Cross and Margarita Grizzle witnessed RN attempting to call report.
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
## 2018-12-06 NOTE — Progress Notes (Signed)
Pt was placed on 1:1 per Dr. Karmen Stabs order.  He was agitated and demanding to leave.  He is currently resting in his bed with his eyes closed and appears to be asleep.  1:1 initiated for safety.  We will continue to monitor the progress towards his goals.

## 2018-12-06 NOTE — ED Notes (Signed)
Pt offered pillow for comfort. Pt stated "fuck a pillow. I want your ass." Pt informed that comments like that should not be said. Pt became more agitated. Will continue to monitor.

## 2018-12-06 NOTE — Progress Notes (Signed)
Patient is uncooperative with medication history interview. Last filled dates have been added to all the PTA meds we could but we are unable to confirm if he is taking any of them.   Romeo Rabon, PharmD. Mobile: [phone]. 12/06/2018,9:57 AM.

## 2018-12-06 NOTE — ED Triage Notes (Signed)
Verbalized SI r/t sons recent death by overdose pt took unknown amount of BP medication large amount ETOH Pt combative with family and law enforcement. Pt family states he is a heroin abuser  Drinks large amount ETOH

## 2018-12-06 NOTE — ED Notes (Signed)
RN spoke with Ivonne Andrew at [facility] and stated that RN receiving report was providing patient care and will call RN Ali Lowe when able ASAP to receive report.

## 2018-12-06 NOTE — ED Notes (Signed)
Dispatch has been called to escort patient to [facility] unit across the street via [employer].

## 2018-12-06 NOTE — ED Notes (Signed)
RN was able to speak with [facility] RN however due to being down 2 [facility] RN;s and not having Network engineer, Staff was not able to receive report at this time. RN provided name and number for ED Secretary and RN in order to receive report.

## 2018-12-06 NOTE — Progress Notes (Signed)
Nursing 1:1 note D:Pt observed sleeping in bed with eyes closed. RR even and unlabored. No distress noted. A: 1:1 observation continues for safety  R: pt remains safe  

## 2018-12-06 NOTE — Tx Team (Signed)
Initial Treatment Plan 12/06/2018 5:01 PM [name] MGN:[id number]    PATIENT STRESSORS: Health problems Loss of son   PATIENT STRENGTHS: Capable of independent living Communication skills Supportive family/friends   PATIENT IDENTIFIED PROBLEMS: Depression  Suicide attempt  Agitation    Pt would not answer questions             DISCHARGE CRITERIA:  Improved stabilization in mood, thinking, and/or behavior Need for constant or close observation no longer present Verbal commitment to aftercare and medication compliance  PRELIMINARY DISCHARGE PLAN: Outpatient therapy Medication management  PATIENT/FAMILY INVOLVEMENT: This treatment plan has been presented to and reviewed with the patient, Bryan Gallegos.  The patient and family have been given the opportunity to ask questions and make suggestions.  Windell Moment, RN 12/06/2018, 5:01 PM
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
## 2018-12-06 NOTE — ED Notes (Signed)
Bilateral ankle restraints removed due to pt cooperating
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
## 2018-12-06 NOTE — Progress Notes (Signed)
Patient ID: Bryan Gallegos, male   DOB: 1975-03-24, 43 y.o.   MRN: [id number] Pt arrived to [facility], placed in the search room and banging aggressively banging his head and pounding his fist. Pt demanding to leave hospital. Demanding to see the psychiatrist. Writer unable to complete assessment as pt was taken directly to unit after obtaining vital signs and visual search. Pt placed on 1:1 and moved to locked 400 hall unit. Medications administered.

## 2018-12-06 NOTE — Discharge Summary (Signed)
  Patient to be transferred [facility] for inpatient psychiatric treatment

## 2018-12-07 DIAGNOSIS — F332 Major depressive disorder, recurrent severe without psychotic features: Principal | ICD-10-CM

## 2018-12-07 MED ORDER — METFORMIN HCL 1000 MG PO TABS: 1000.0000 mg | ORAL_TABLET | Freq: Every day | ORAL | 0 refills | Status: AC

## 2018-12-07 NOTE — BHH Counselor (Signed)
Adult Comprehensive Assessment  Patient ID: Bryan Gallegos, male   DOB: 05/31/1975, 43 y.o.   MRN: [id number]  Summary/Recommendations:   Summary and Recommendations (to be completed by the evaluator): [name] is a 43 year old male who presents to [facility] under IVC. As with his last admission in May 2020, this patient is discharging within 24 hours and declined to speak with CSW. He declines consents for collateral contacts and SPE. He denies SI/HI/AH/VH. He reports he schedules his own appointments with [facility] for therapy and medication management. He declines additional referrals from [place].  [name]. 12/07/2018

## 2018-12-07 NOTE — Progress Notes (Signed)
  [facility] Adult Case Management Discharge Plan :  Will you be returning to the same living situation after discharge:  Yes,  home. At discharge, do you have transportation home?: Yes,  family picking up at 10:30am. Do you have the ability to pay for your medications: Yes,  [employer] Medicare.  Release of information consent forms completed and in the chart.  Patient to Follow up at: Follow-up Information    [facility] Follow up on 12/11/2018.   Why: Medication management with Dr. Royce Macadamia is Monday, 10/12 at 11:00a.  Please bring your current medications.  Therapy with Stanton Kidney is Wednesday, 10/14 at 5:00p.  Contact information: [place] [address] ph: [phone] fx: [phone]          Next level of care provider has access to [place] and Suicide Prevention discussed: No. Refused SPE. Pamphlet placed on chart.  Have you used any form of tobacco in the last 30 days? (Cigarettes, Smokeless Tobacco, Cigars, and/or Pipes): Patient Refused Screening  Has patient been referred to the Quitline?: Patient refused referral  Patient has been referred for addiction treatment: Pt. refused referral  [name], [place] 12/07/2018, 10:26 AM

## 2018-12-07 NOTE — Progress Notes (Signed)
Nursing 1:1 note D:Pt observed sleeping in bed with eyes closed. RR even and unlabored. No distress noted. A: 1:1 observation continues for safety  R: pt remains safe  

## 2018-12-07 NOTE — BHH Group Notes (Signed)
[place] Group Notes:  (Nursing/MHT/Case Management/Adjunct)  Date:  12/07/2018  Time:  10:00 AM  Type of Therapy:  Nurse Education  Participation Level:  Active  Participation Quality:  Appropriate, Attentive, Sharing and Supportive  Affect:  Appropriate  Cognitive:  Alert and Appropriate  Insight:  Appropriate, Good and Improving  Engagement in Group:  Developing/Improving, Engaged, Improving and Supportive  Modes of Intervention:  Discussion, Education, Exploration, Socialization and Support  Summary of Progress/Problems: pt's discussed crisis management and how this related to past/current crisis they were/are facing. Pt's were guided through their suicide safety plan and how each section could aid in preventing another crisis from escalating. Pt's shared their resources to help others realize coping skills, support systems and resources that could be utilized. Pt shared past/present trauma and how they plan to deal with with in future events. Pt had to leave group early because of discharge.  Bryan Gallegos 12/07/2018, 11:09 AM

## 2018-12-07 NOTE — Progress Notes (Signed)
Patient ID: Bryan Gallegos, male   DOB: 08-22-1975, 43 y.o.   MRN: [id number] Pt d/ to home with son. Pt refused all f/u, d.c, instructions, pt bright, laughing, smiling while walking out.
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
## 2018-12-07 NOTE — [facility] Suicide Risk Assessment (Signed)
[facility] Discharge Suicide Risk Assessment   Principal Problem: <principal problem not specified> Discharge Diagnoses: Active Problems:   MDD (major depressive disorder), recurrent episode, severe ([place])   Total Time spent with patient: 30 minutes  Musculoskeletal: Strength & Muscle Tone: within normal limits Gait & Station: normal Patient leans: N/A  Psychiatric Specialty Exam: Review of Systems  All other systems reviewed and are negative.   Blood pressure (!) 162/86, pulse 83, resp. rate 18, height 6\' 2"  (1.88 m), weight (!) 161.9 kg, SpO2 96 %.Body mass index is 45.84 kg/m.  General Appearance: Disheveled  Eye Sport and exercise psychologist::  Fair  Speech:  Normal Rate409  Volume:  Normal  Mood:  Euthymic  Affect:  Congruent  Thought Process:  Coherent and Descriptions of Associations: Intact  Orientation:  Full (Time, Place, and Person)  Thought Content:  Logical  Suicidal Thoughts:  No  Homicidal Thoughts:  No  Memory:  Immediate;   Fair Recent;   Fair Remote;   Fair  Judgement:  Intact  Insight:  Lacking  Psychomotor Activity:  Normal  Concentration:  Fair  Recall:  [employer] of Knowledge:Fair  Language: Fair  Akathisia:  Negative  Handed:  Right  AIMS (if indicated):     Assets:  Desire for Improvement Resilience  Sleep:     Cognition: WNL  ADL's:  Intact   Mental Status Per Nursing Assessment::   On Admission:  NA(pt refused to answer questions)  Demographic Factors:  Male, Divorced or widowed, Caucasian, Low socioeconomic status and Unemployed  Loss Factors: Loss of significant relationship  Historical Factors: Family history of suicide and Impulsivity  Risk Reduction Factors:   Living with another person, especially a relative  Continued Clinical Symptoms:  Bipolar Disorder:   Mixed State Alcohol/Substance Abuse/Dependencies Chronic Pain More than one psychiatric diagnosis Previous Psychiatric Diagnoses and Treatments Medical Diagnoses and  Treatments/Surgeries  Cognitive Features That Contribute To Risk:  None    Suicide Risk:  Minimal: No identifiable suicidal ideation.  Patients presenting with no risk factors but with morbid ruminations; may be classified as minimal risk based on the severity of the depressive symptoms    Plan Of Care/Follow-up recommendations:  Activity:  ad lib  Sharma Covert, MD 12/07/2018, 8:29 AM
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
## 2018-12-07 NOTE — BHH Suicide Risk Assessment (Signed)
[facility] Admission Suicide Risk Assessment   Nursing information obtained from:  Patient Demographic factors:  Male, Caucasian Current Mental Status:  NA(pt refused to answer questions) Loss Factors:  Loss of significant relationship Historical Factors:  Prior suicide attempts, Family history of suicide, Impulsivity Risk Reduction Factors:  NA  Total Time spent with patient: 45 minutes Principal Problem: <principal problem not specified> Diagnosis:  Active Problems:   MDD (major depressive disorder), recurrent episode, severe (HCC)  Subjective Data: Patient is seen and examined.  Patient is a 43 year old male with a probable past psychiatric history significant for adjustment disorder, alcohol abuse, chronic pain, chronic opioid use, chronic benzodiazepine use and attention deficit disorder who presented to the [facility] emergency department via EMS due to what was thought to be a suicide attempt.  Patient stated that his son had recently died from a heroin overdose.  He was with his family members and got into an argument.  He felt like his son's girlfriend was stating things that were untrue and upsetting.  He then drank a full glass of an unspecified alcohol.  He stated he had placed a large amount of pills in his mouth, but spit them out.  He was apparently intoxicated at the time.  He stated he did not recall what happened after the argument.  He was taken to the emergency room, and in the emergency room was significantly agitated.  He told the folks in the emergency room that if he wanted to kill himself he would have already done it.  He has chronic prescriptions for opiates, benzodiazepines and stimulants that were recently filled on 11/29/2018.  He had been hospitalized in July of this year similar to this circumstance, and was released when he was evaluated and found to be not suicidal.  This a.m. he stated he was intoxicated and upset about his son's death.  He stated he  needed to be discharged so he can make arrangements for his son's funeral.  He was admitted to the hospital for evaluation and stabilization.  Continued Clinical Symptoms:    The "Alcohol Use Disorders Identification Test", Guidelines for Use in Primary Care, Second Edition.  World Science writer [facility]). Score between 0-7:  no or low risk or alcohol related problems. Score between 8-15:  moderate risk of alcohol related problems. Score between 16-19:  high risk of alcohol related problems. Score 20 or above:  warrants further diagnostic evaluation for alcohol dependence and treatment.   CLINICAL FACTORS:   Bipolar Disorder:   Mixed State Alcohol/Substance Abuse/Dependencies Chronic Pain More than one psychiatric diagnosis Medical Diagnoses and Treatments/Surgeries   Musculoskeletal: Strength & Muscle Tone: within normal limits Gait & Station: normal Patient leans: N/A  Psychiatric Specialty Exam: Physical Exam  Nursing note and vitals reviewed. Constitutional: He is oriented to person, place, and time. He appears well-developed and well-nourished.  HENT:  Head: Normocephalic and atraumatic.  Respiratory: Effort normal.  Neurological: He is alert and oriented to person, place, and time.    ROS  Blood pressure (!) 162/86, pulse 83, resp. rate 18, height 6\' 2"  (1.88 m), weight (!) 161.9 kg, SpO2 96 %.Body mass index is 45.84 kg/m.  General Appearance: Disheveled  Eye Contact:  Good  Speech:  Normal Rate  Volume:  Normal  Mood:  Euthymic  Affect:  Congruent  Thought Process:  Coherent and Descriptions of Associations: Intact  Orientation:  Full (Time, Place, and Person)  Thought Content:  Logical  Suicidal Thoughts:  No  Homicidal Thoughts:  No  Memory:  Immediate;   Fair Recent;   Fair Remote;   Fair  Judgement:  Intact  Insight:  Lacking  Psychomotor Activity:  Normal  Concentration:  Concentration: Fair and Attention Span: Fair  Recall:  [employer] of  Knowledge:  Fair  Language:  Fair  Akathisia:  Negative  Handed:  Right  AIMS (if indicated):     Assets:  Desire for Improvement Resilience  ADL's:  Intact  Cognition:  WNL  Sleep:         COGNITIVE FEATURES THAT CONTRIBUTE TO RISK:  None    SUICIDE RISK:   Minimal: No identifiable suicidal ideation.  Patients presenting with no risk factors but with morbid ruminations; may be classified as minimal risk based on the severity of the depressive symptoms  PLAN OF CARE: Patient is seen and examined.  Patient is a 43 year old male with the above-stated past psychiatric history who became intoxicated and had a suicidal gesture.  His son died recently of a heroin overdose.  He is requesting discharge to be able to go take care of the funeral arrangements.  He was intoxicated with a blood alcohol of 150.  He stated that he could not remember the last time he had had alcohol before last night.  He denies suicidality.  He had a similar circumstance in July and was released.  Given the circumstances he will be discharged home today.  I certify that inpatient services furnished can reasonably be expected to improve the patient's condition.   Sharma Covert, MD 12/07/2018, 8:23 AM

## 2018-12-07 NOTE — Progress Notes (Signed)
Bryan Gallegos is a 43 year old male who presents to [facility] under IVC. As with his last admission in May 2020, this patient is discharging within 24 hours and declined to speak with CSW. He declines consents for collateral contacts and SPE. He denies SI/HI/AH/VH. He reports he schedules his own appointments with [facility] for therapy and medication management. He declines additional referrals from [facility].  Stephanie Acre, LCSW-A Clinical Social Worker

## 2018-12-07 NOTE — BHH Suicide Risk Assessment (Signed)
[place] INPATIENT:  Family/Significant Other Suicide Prevention Education  Suicide Prevention Education:  Patient Refusal for Family/Significant Other Suicide Prevention Education: The patient Bryan Gallegos has refused to provide written consent for family/significant other to be provided Family/Significant Other Suicide Prevention Education during admission and/or prior to discharge.  Physician notified.  [name] 12/07/2018, 10:24 AM

## 2019-01-07 ENCOUNTER — Encounter (HOSPITAL_COMMUNITY): Payer: Self-pay | Admitting: Emergency Medicine

## 2019-01-07 ENCOUNTER — Other Ambulatory Visit: Payer: Self-pay

## 2019-01-07 ENCOUNTER — Emergency Department (HOSPITAL_COMMUNITY)
Admission: EM | Admit: 2019-01-07 | Discharge: 2019-01-07 | Disposition: A | Discharge status: Home / Self Care | Payer: Medicare HMO | Attending: Emergency Medicine | Admitting: Emergency Medicine

## 2019-01-07 DIAGNOSIS — F17210 Nicotine dependence, cigarettes, uncomplicated: Secondary | ICD-10-CM | POA: Diagnosis not present

## 2019-01-07 DIAGNOSIS — Y929 Unspecified place or not applicable: Secondary | ICD-10-CM | POA: Diagnosis not present

## 2019-01-07 DIAGNOSIS — Y999 Unspecified external cause status: Secondary | ICD-10-CM | POA: Diagnosis not present

## 2019-01-07 DIAGNOSIS — W260XXA Contact with knife, initial encounter: Secondary | ICD-10-CM | POA: Insufficient documentation

## 2019-01-07 DIAGNOSIS — E119 Type 2 diabetes mellitus without complications: Secondary | ICD-10-CM | POA: Diagnosis not present

## 2019-01-07 DIAGNOSIS — Y9389 Activity, other specified: Secondary | ICD-10-CM | POA: Insufficient documentation

## 2019-01-07 DIAGNOSIS — Z79899 Other long term (current) drug therapy: Secondary | ICD-10-CM | POA: Insufficient documentation

## 2019-01-07 DIAGNOSIS — I10 Essential (primary) hypertension: Secondary | ICD-10-CM | POA: Insufficient documentation

## 2019-01-07 DIAGNOSIS — J45909 Unspecified asthma, uncomplicated: Secondary | ICD-10-CM | POA: Diagnosis not present

## 2019-01-07 DIAGNOSIS — S81812A Laceration without foreign body, left lower leg, initial encounter: Secondary | ICD-10-CM | POA: Insufficient documentation

## 2019-01-07 LAB — CBG MONITORING, ED: Glucose-Capillary: 203 mg/dL — ABNORMAL HIGH (ref 70–99)

## 2019-01-07 MED ORDER — LIDOCAINE-EPINEPHRINE (PF) 2 %-1:200000 IJ SOLN
10.0000 mL | Freq: Once | INTRAMUSCULAR | Status: DC
  Filled 2019-01-07: qty 10

## 2019-01-07 MED ORDER — BACITRACIN-NEOMYCIN-POLYMYXIN 400-5-5000 EX OINT
TOPICAL_OINTMENT | Freq: Once | CUTANEOUS | Status: AC
  Administered 2019-01-07: 04:00:00 via TOPICAL
  Filled 2019-01-07: qty 1
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
## 2019-01-07 NOTE — ED Triage Notes (Signed)
Pt states he cut his left thigh while sharpening his knife tonight. 4cm laceration with bleeding controlled at this time.

## 2019-01-07 NOTE — Discharge Instructions (Addendum)
Keep the wound clean and dry.  You can use triple antibiotic ointment on the wound.  The staples need to be removed in 12 days, around November 20.  Recheck sooner if you feel like they are getting infected.  You had a tetanus booster done in 2016 and 2018.

## 2019-01-11 IMAGING — DX DG CHEST 2V
3 series · 3 of 3 positions shown · non-contrast
Comparison: CT and CXR from 11/11/2016.

CLINICAL DATA: Right-sided chest pain onset 1-1/2 hours ago. Thirty
smoker.

EXAM:
CHEST  2 VIEW

[chest pa]
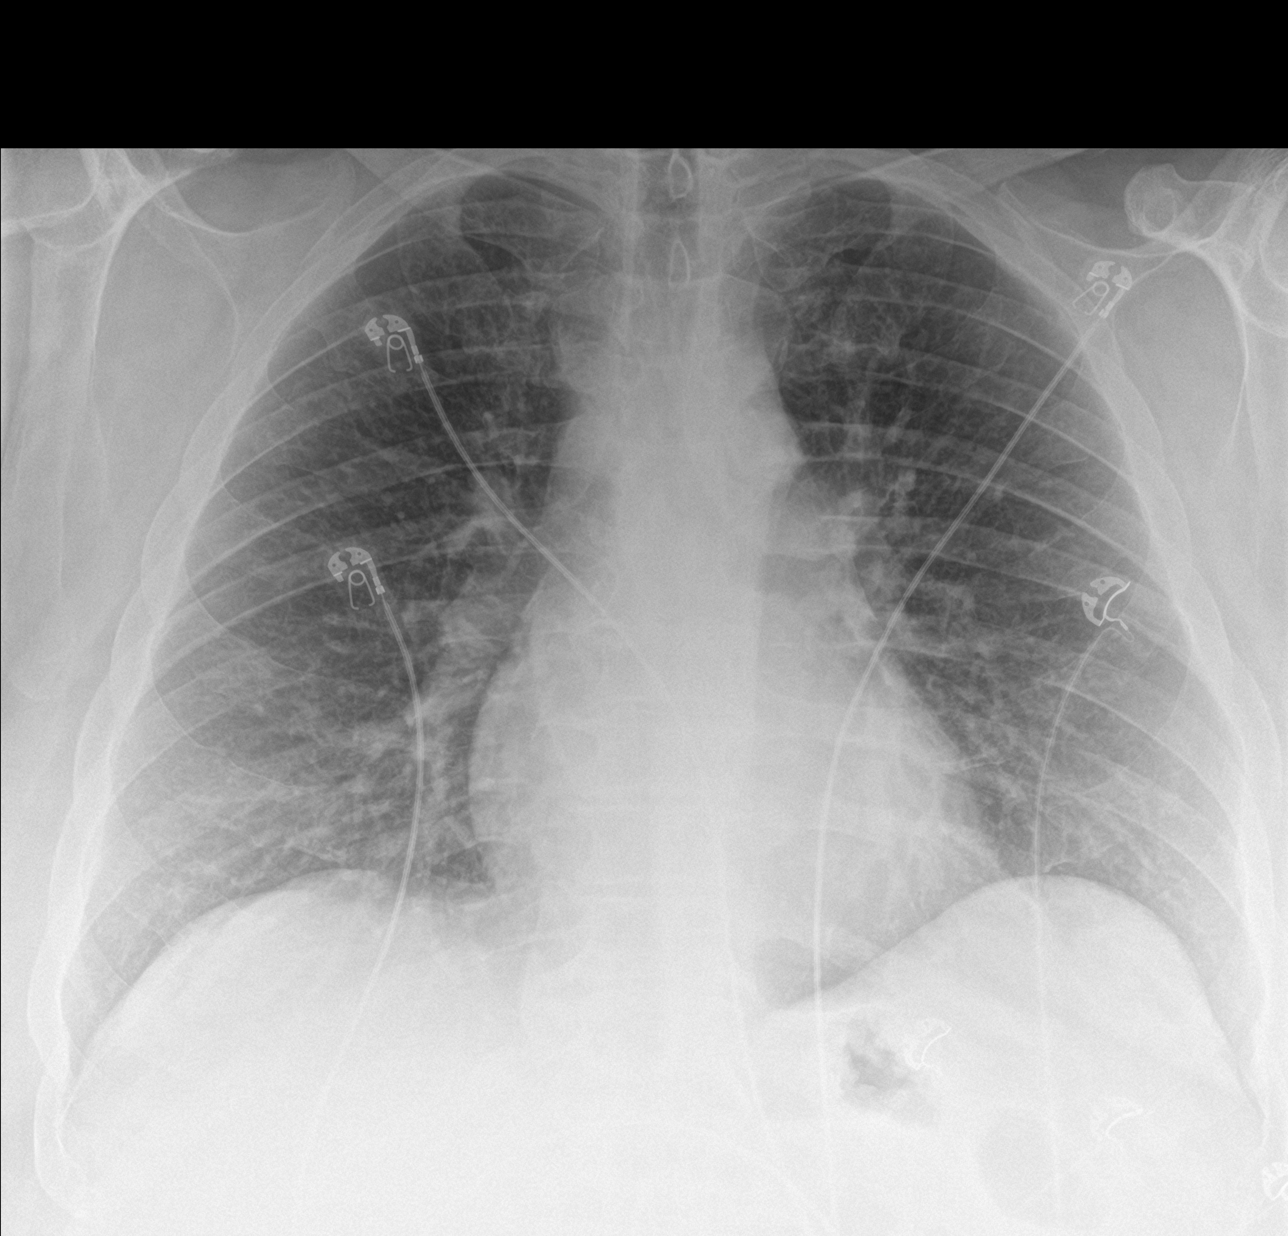

[w chest pa]
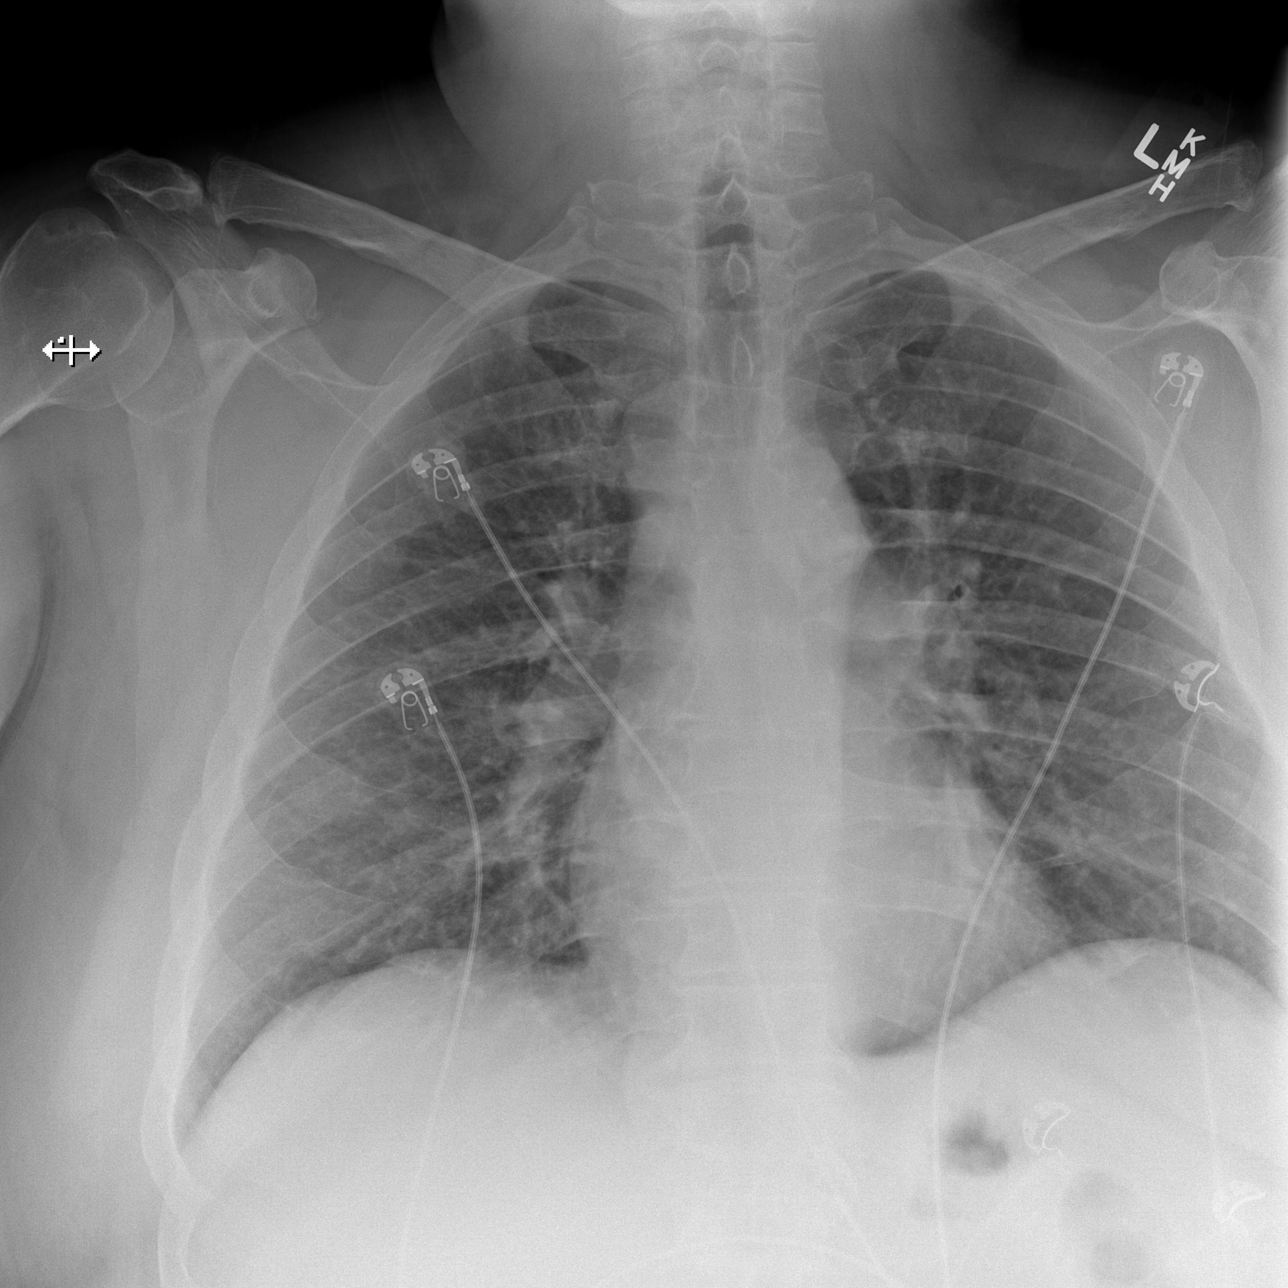

[chest lat]
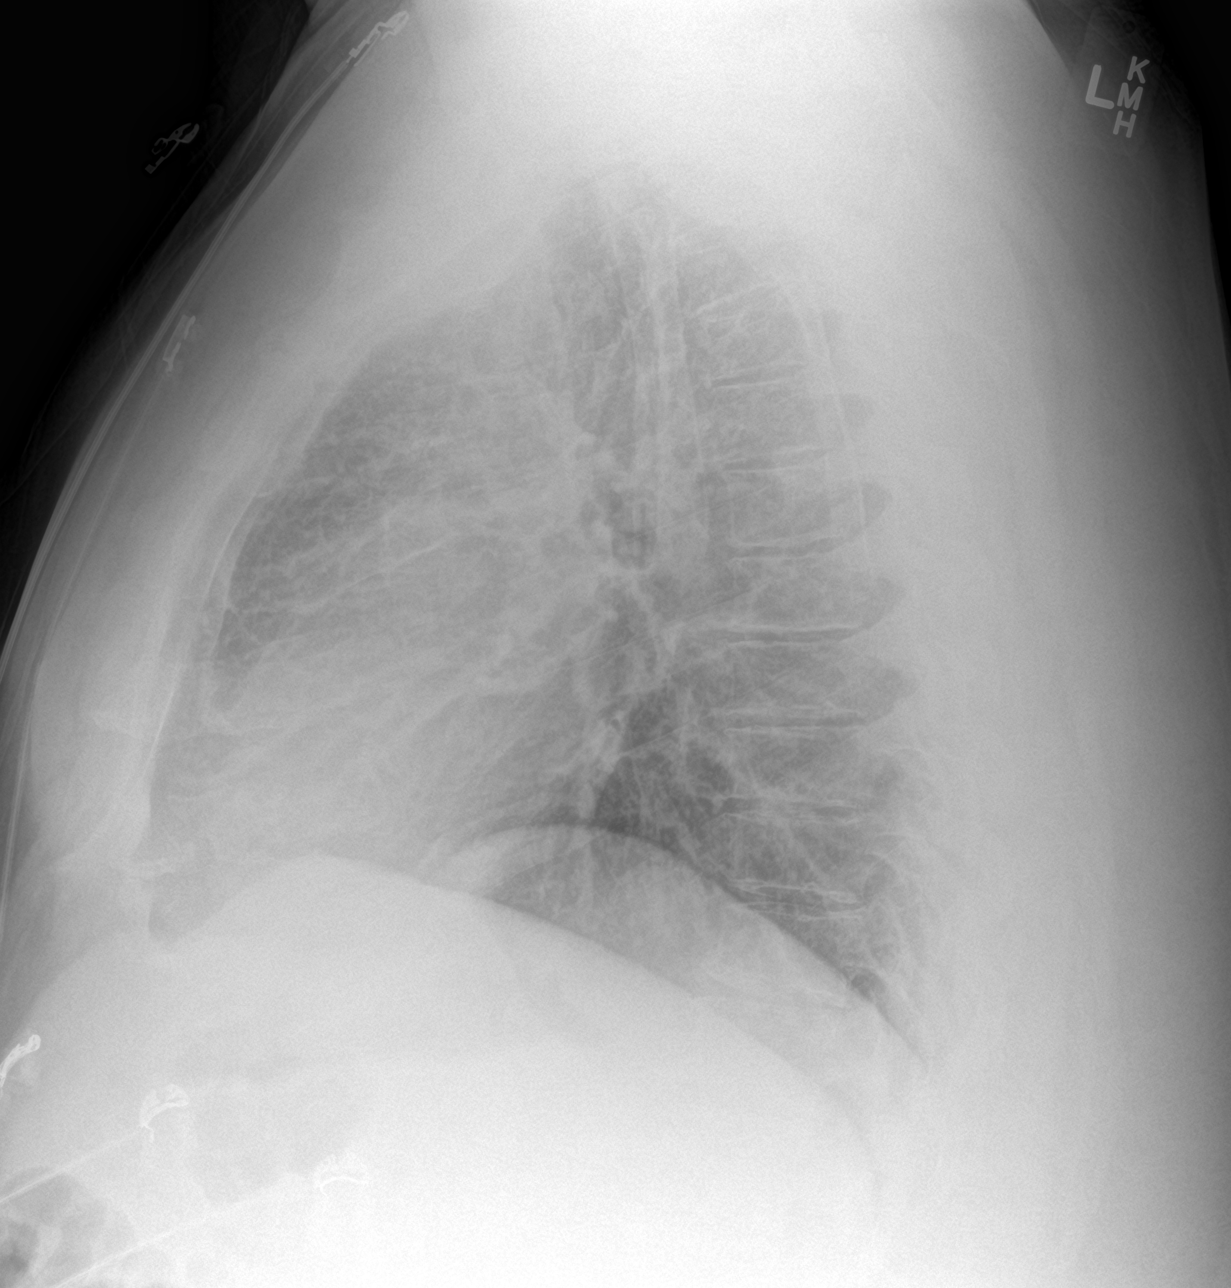

[3 of 3 positions shown; findings below may reference images not displayed]

FINDINGS: The heart size and mediastinal contours are within normal limits.
Mild interstitial prominence bilaterally which may reflect smoking
related bronchitic change. Both lungs otherwise unremarkable and
free of pneumonic consolidations. The visualized skeletal structures
are unremarkable.
IMPRESSION: Increased interstitial prominence which may reflect smoking related
bronchitic change.

## 2019-01-23 ENCOUNTER — Other Ambulatory Visit: Payer: Self-pay | Admitting: Pharmacist

## 2019-01-23 DIAGNOSIS — B182 Chronic viral hepatitis C: Secondary | ICD-10-CM

## 2019-01-29 IMAGING — CT CT GUIDANCE NEEDLE PLACEMENT
4 of 8 series · 10 of 32 positions shown, 15 images · non-contrast
Comparison: Thoracic and lumbar spine MRI - earlier same day

INDICATION: Concern for acute thoracic osteomyelitis with paraspinal phlegmon.
Please perform CT-guided paraspinal aspiration for tissue diagnostic
purposes.

EXAM:
CT-GUIDED ASPIRATION T11-T12 PARASPINAL PHLEGMON

[Series 2: i-spiral 5.0 b40f · axial · 0.94mm/px · z∈[+1183,+1291]mm · 4 of 53 slices shown, 9 images (1 of 4)]
[im 11/53  soft-tissue]
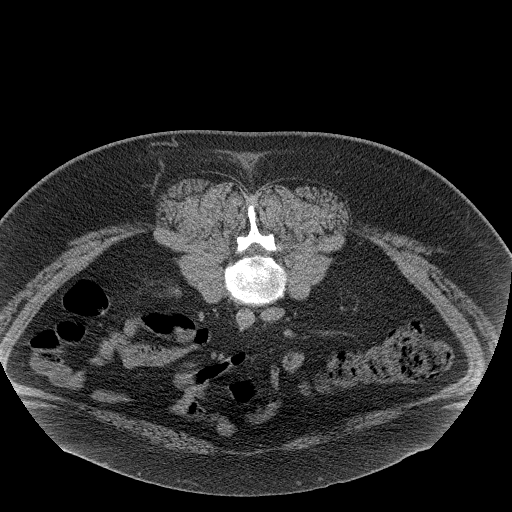
[im 11/53  lung]
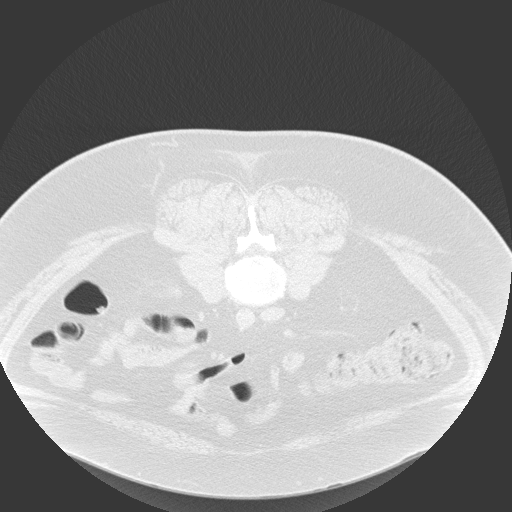
[im 11/53  bone]
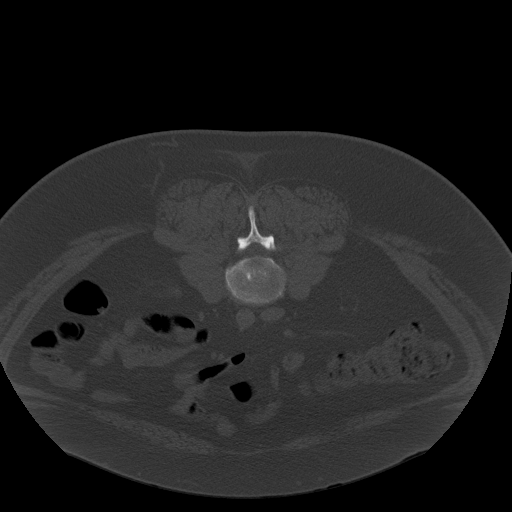
[im 21/53  soft-tissue]
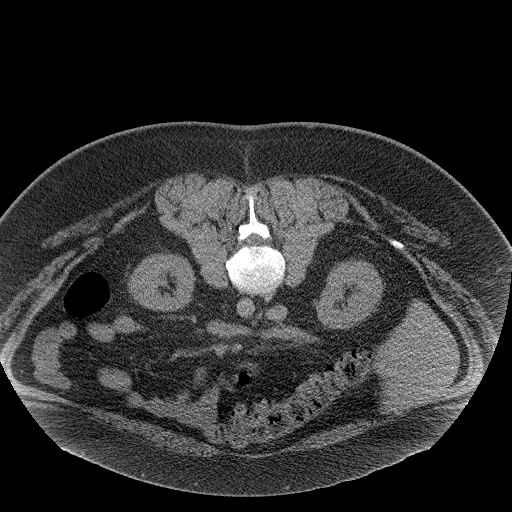
[im 21/53  lung]
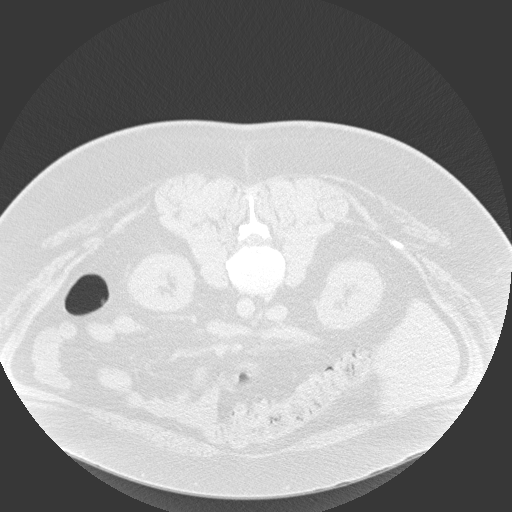
[im 32/53  soft-tissue]
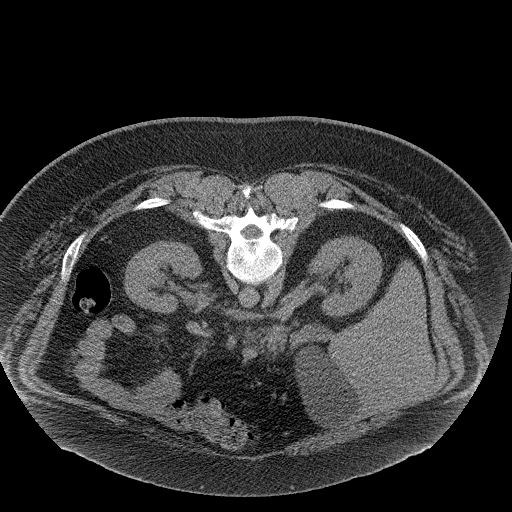
[im 32/53  lung]
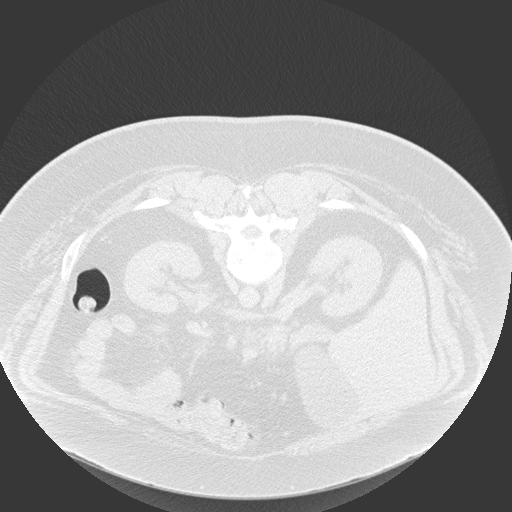
[im 42/53  soft-tissue]
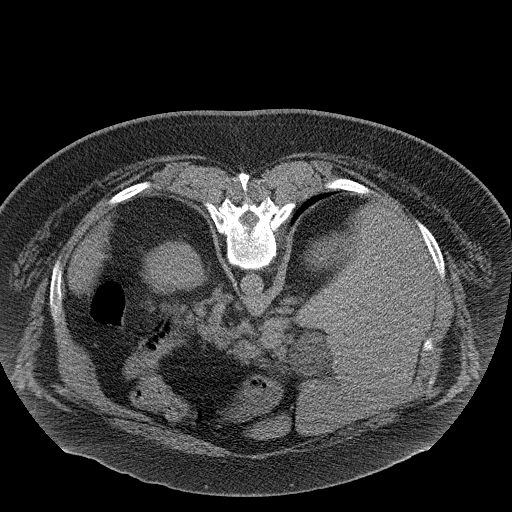
[im 42/53  lung]
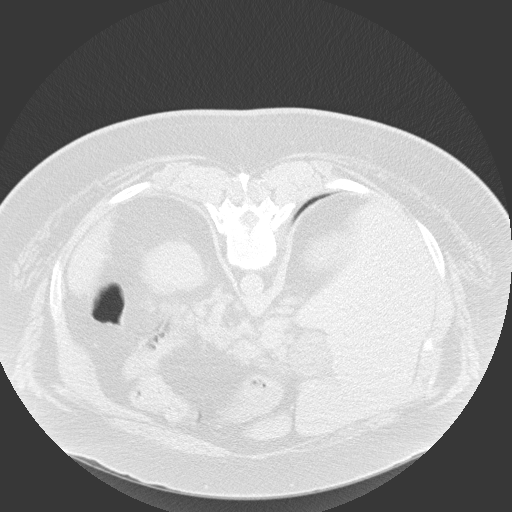

[Series 6: i-spiral 5.0 b40f · axial · 0.94mm/px · z∈[+1348,+1383]mm · 2 of 32 slices shown (2 of 4)]
[im 11/32  soft-tissue]
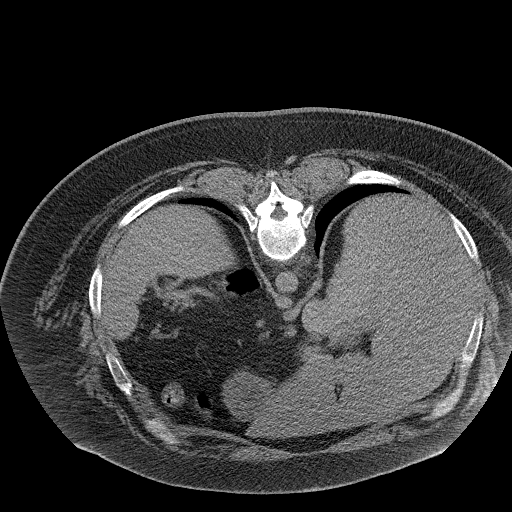
[im 21/32  soft-tissue]
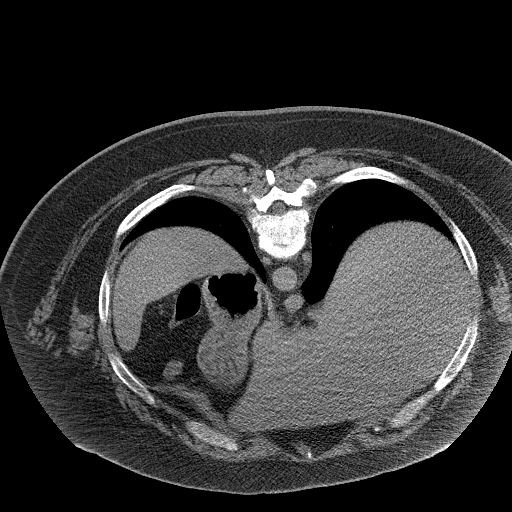

[Series 7: i-spiral 5.0 b40f · axial · 0.94mm/px · z∈[+1348,+1383]mm · 2 of 32 slices shown (3 of 4)]
[im 11/32  soft-tissue]
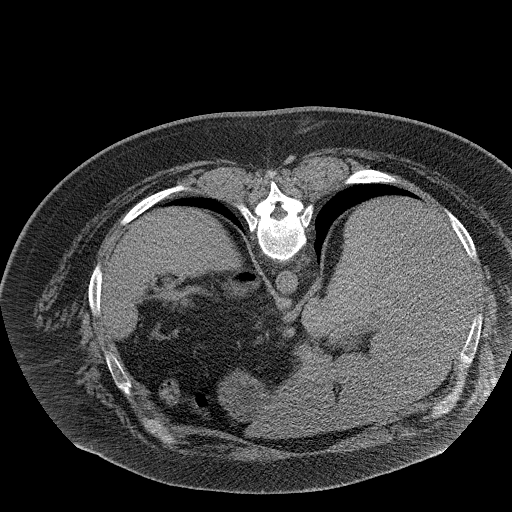
[im 21/32  soft-tissue]
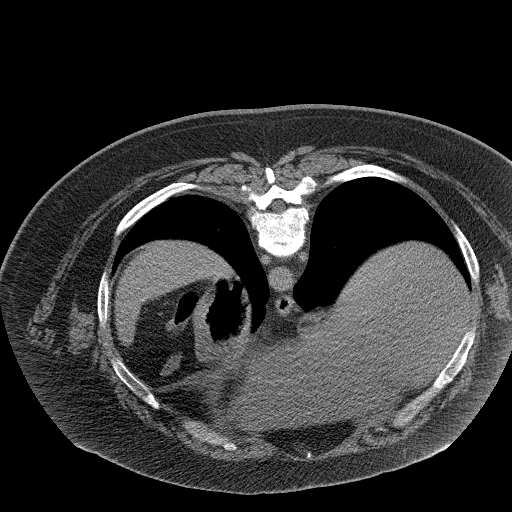

[Series 8: i-spiral 5.0 b40f · axial · 0.94mm/px · z∈[+1348,+1383]mm · 2 of 32 slices shown (4 of 4)]
[im 11/32  soft-tissue]
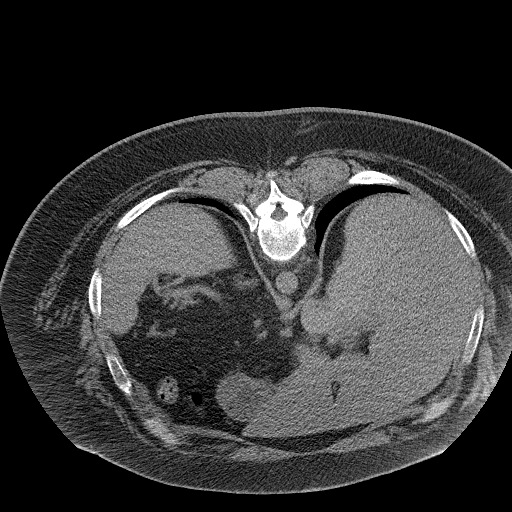
[im 21/32  soft-tissue]
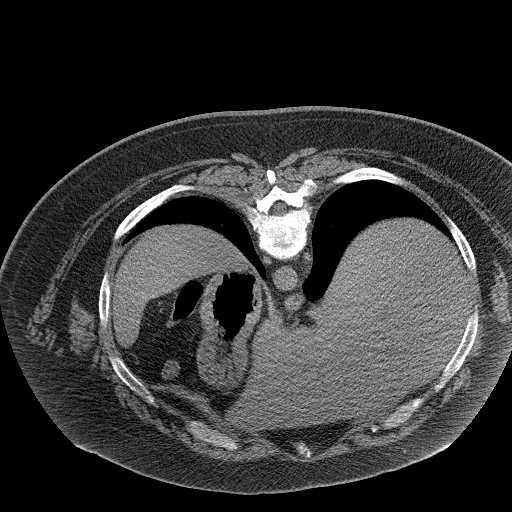

[10 of 32 positions shown; findings below may reference images not displayed]

MEDICATIONS:
The patient is currently admitted to the hospital and receiving
intravenous antibiotics. The antibiotics were administered within an
appropriate time frame prior to the initiation of the procedure.

ANESTHESIA/SEDATION:
Moderate (conscious) sedation was employed during this procedure. A
total of Versed 2 mg and Fentanyl 100 mcg was administered
intravenously.

Moderate Sedation Time: 14 minutes. The patient's level of
consciousness and vital signs were monitored continuously by
radiology nursing throughout the procedure under my direct
supervision.

CONTRAST:  None

COMPLICATIONS:
None immediate.

PROCEDURE:
Informed written consent was obtained from the patient after a
discussion of the risks, benefits and alternatives to treatment. The
patient was placed prone on the CT gantry and a pre procedural CT
was performed re-demonstrating the known ill-defined soft tissue
surrounding the prominent right-sided T11-T12 osteophyte (image 6,
series 3). The procedure was planned. A timeout was performed prior
to the initiation of the procedure.

The skin overlying the posterior mid back was prepped and draped in
the usual sterile fashion. The overlying soft tissues were
anesthetized with 1% lidocaine with epinephrine. Appropriate
trajectory was planned with the use of a 22 gauge spinal needle.

An 18 gauge coaxial needle was advanced adjacent to the posterior
aspect of the prominent right-sided disc osteophyte (image 20,
series 10). As no fluid was initially able to be aspirated from this
location, a small amount of saline was injected and subsequently
aspirated. All aspirated fluid was capped and sent to the laboratory
for analysis.

A dressing was placed. The patient tolerated the procedure well
without immediate post procedural complication.
IMPRESSION: Technically successful CT guided aspiration of phlegmonous tissue
adjacent to the right-sided T11-T12 disc osteophyte.

Samples were sent to the laboratory as requested by the ordering
clinical team.

## 2019-01-29 IMAGING — MR MR LUMBAR SPINE WO/W CM
8 of 20 series · 16 of 48 positions shown · IV contrast (multihance)
Comparison: CTA chest abdomen and pelvis without contrast
11/11/2016. CT Abdomen and Pelvis 10/14/2015.

ADDENDUM:
Study discussed by telephone with Dr. Zuyen in the ED on 05/01/2017 at
[DATE] .
CLINICAL DATA: 41-year-old male with history of IV drug use.
Increasing back pain over the past 3 days associated with right
lateral leg paresthesia.

EXAM:
MRI THORACIC AND LUMBAR SPINE WITHOUT AND WITH CONTRAST
TECHNIQUE: Multiplanar and multiecho pulse sequences of the thoracic and lumbar
spine were obtained without and with intravenous contrast.
CONTRAST:  20mL MULTIHANCE GADOBENATE DIMEGLUMINE 529 MG/ML IV SOLN

[Series 5: T1 · sagittal · 3.0mm · 0.90mm/px · 1 of 12 slices shown (1 of 3)]
[im 1/12]
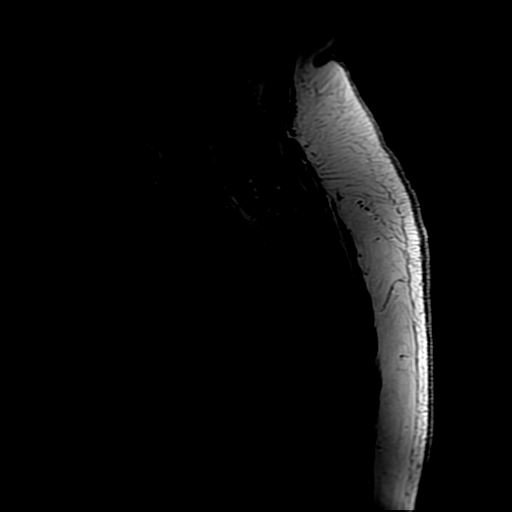

[Series 7: T2 · sagittal · 3.0mm · 0.66mm/px · 1 of 16 slices shown (1 of 5)]
[im 1/16]
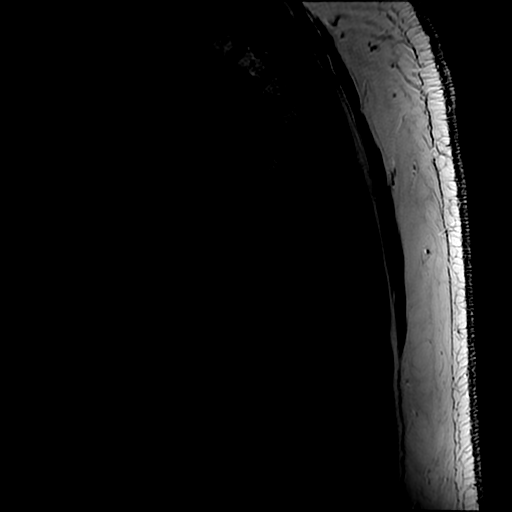

[Series 9: T1 · sagittal · 3.0mm · 0.66mm/px · 2 of 18 slices shown (2 of 3)]
[im 1/18]
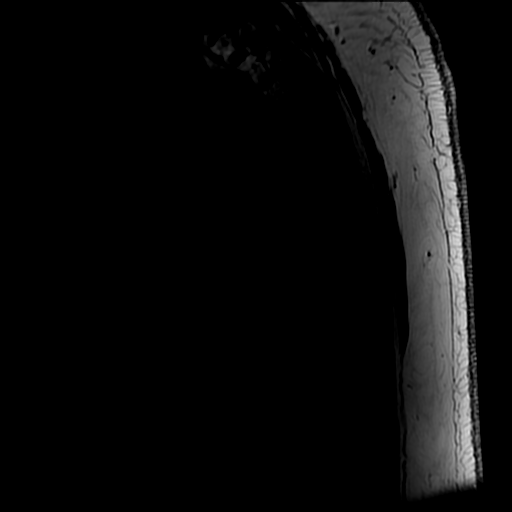
[im 18/18]
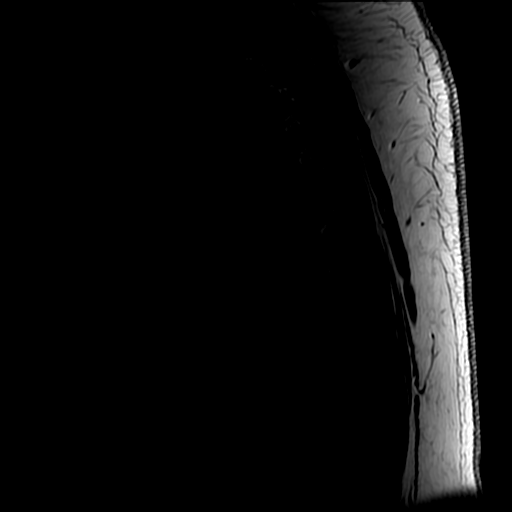

[Series 10: T2 · axial · 4.0mm · 0.39mm/px · z∈[-137,-5]mm · 2 of 26 slices shown (2 of 5)]
[im 1/26]
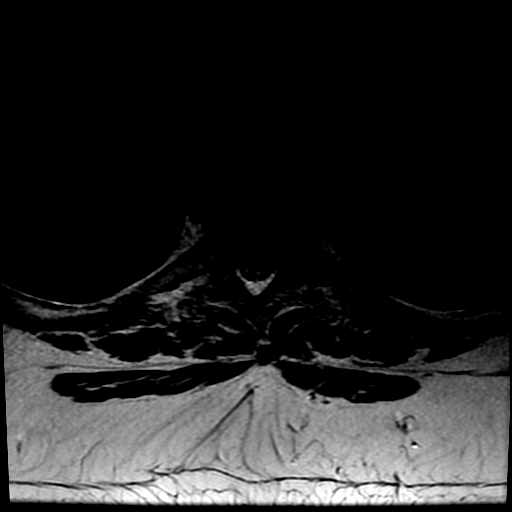
[im 26/26]
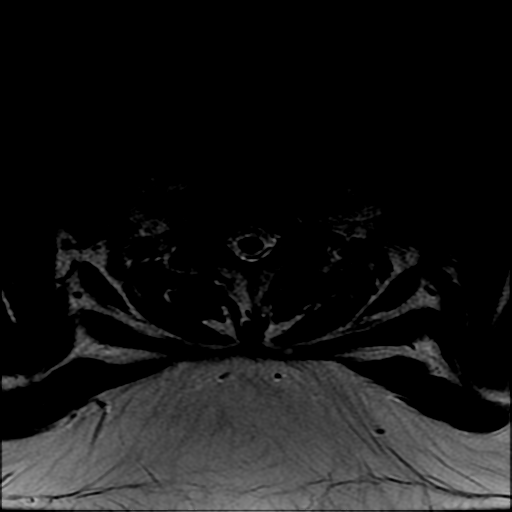

[Series 12: T1 · axial · 4.0mm · 0.39mm/px · 1 of 26 slices shown (3 of 3)]
[im 1/26]
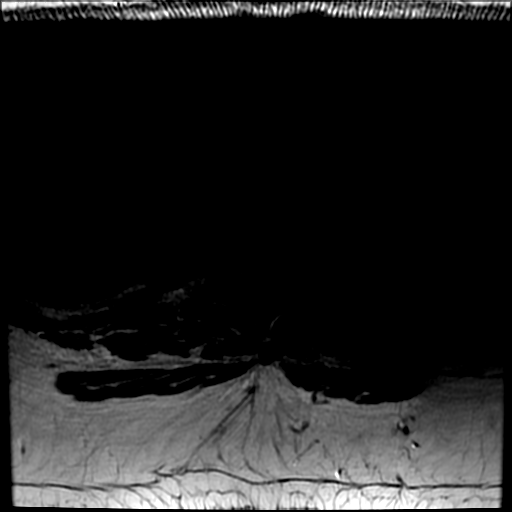

[Series 14: T2 · axial · 4.0mm · 0.39mm/px · z∈[-262,-108]mm · 3 of 29 slices shown (3 of 5)]
[im 1/29]
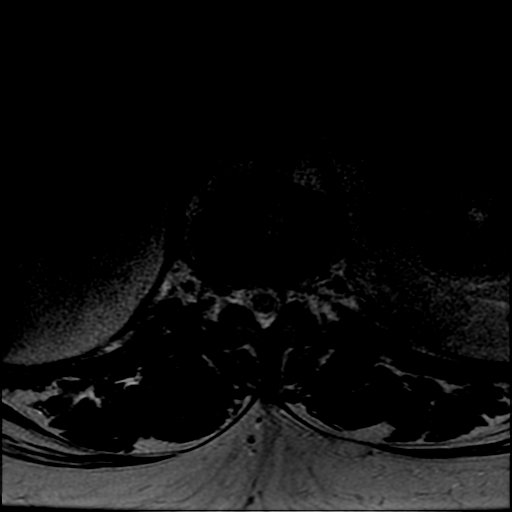
[im 15/29]
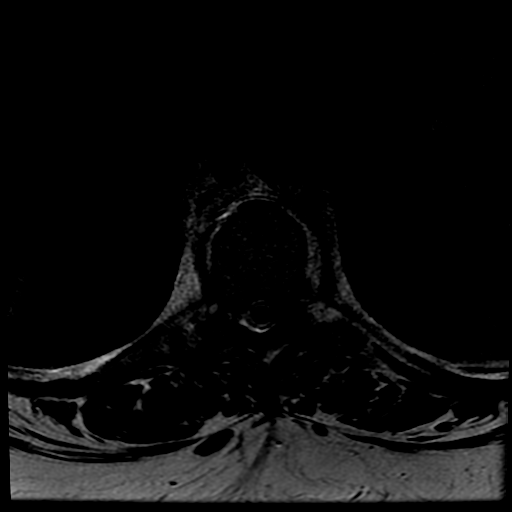
[im 29/29]
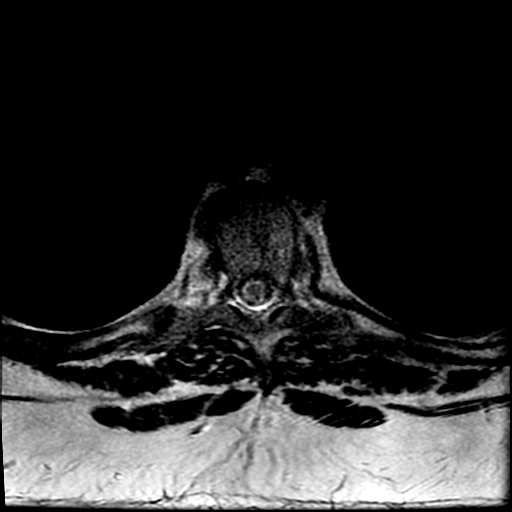

[Series 16: T2 · sagittal · 4.5mm · 0.59mm/px · 2 of 16 slices shown (4 of 5)]
[im 1/16]
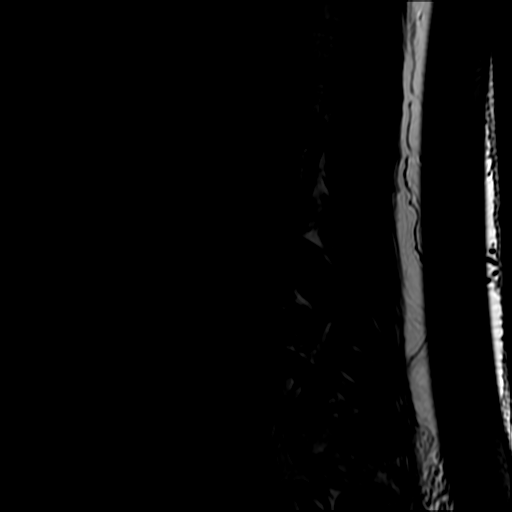
[im 16/16]
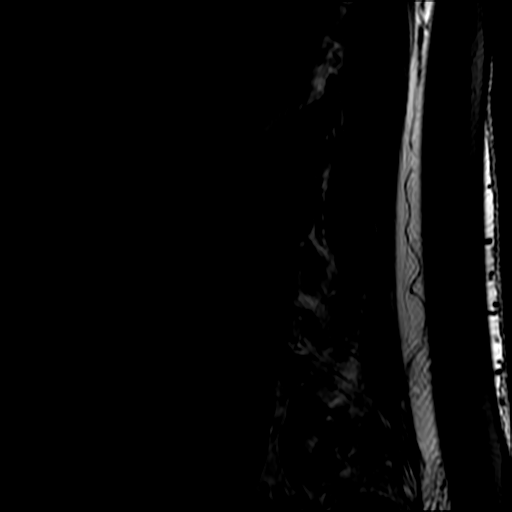

[Series 19: T2 · axial · 4.0mm · 0.47mm/px · z∈[-463,-239]mm · 4 of 46 slices shown (5 of 5)]
[im 1/46]
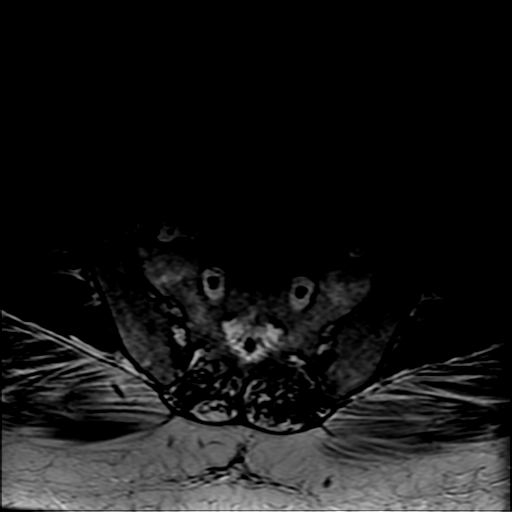
[im 16/46]
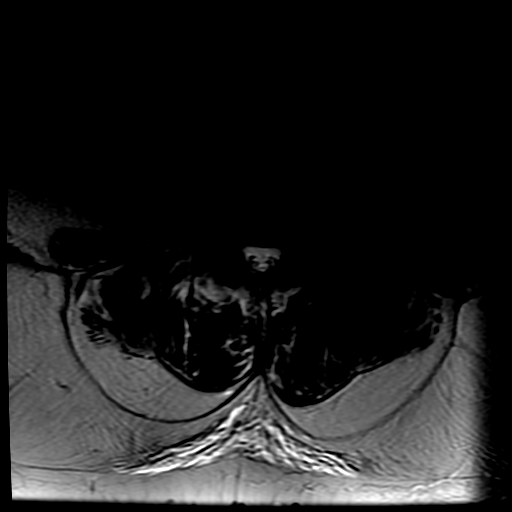
[im 31/46]
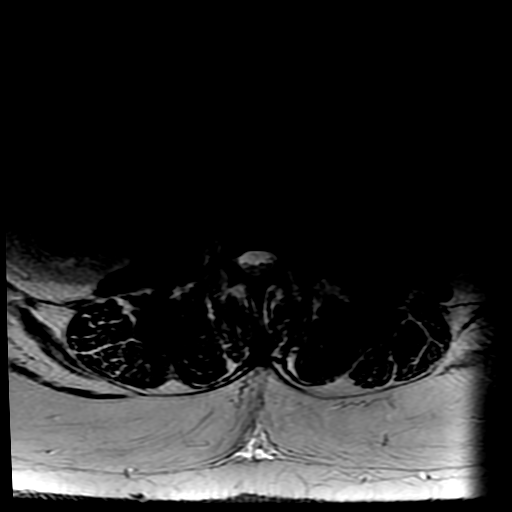
[im 46/46]
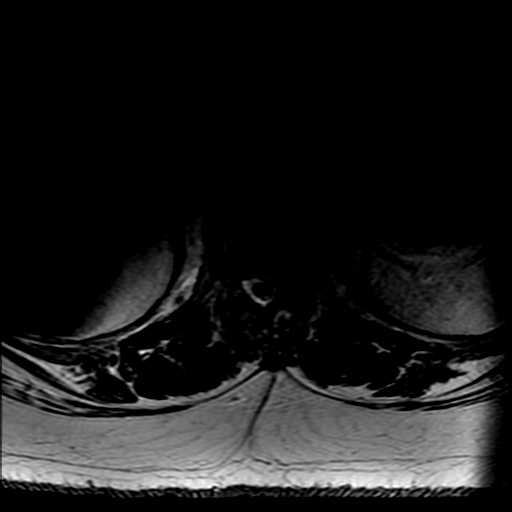

[16 of 48 positions shown; findings below may reference images not displayed]

FINDINGS: MRI THORACIC SPINE FINDINGS

Limited cervical spine imaging:  Grossly negative.

Thoracic spine segmentation:  Appears normal.

Alignment:  Normal thoracic vertebral height and alignment.

Vertebrae: Mild chronic superior endplate deformities at T3 and T4
are without marrow edema. There is a tiny chronic Schmorl's nodes
superiorly at T2 bone marrow signal is normal in the thoracic
vertebrae T1.

The T11 and T12 levels are abnormal, described below.

Cord: Spinal cord signal is within normal limits at all visualized
levels. No abnormal intradural enhancement.

Paraspinal and other soft tissues: Normal except for the right
lateral paraspinal soft tissues at T11 and T12, described below.

Disc levels:

No thoracic spinal stenosis above the T11 level aside from mild
intermittent stenosis related to thoracic epidural lipomatosis.

T11-T12: Abnormal anterior and right lateral endplate and vertebral
body marrow edema with mild enhancement (series 8, image 5).
Preexisting far lateral endplate osteophytes at this level,
confirmed on the 7010 CTA. There is confluent soft tissue
inflammation and enhancement surrounding the right lateral disc
osteophyte complex (series 25, image 1 and series 8, image 1) with
trace paraspinal fluid signal but no drainable paraspinal fluid
collection. No definite abnormal fluid or enhancement within the
disc. The soft tissue inflammation does affect the anterior aspect
of the exiting right T11 nerve. The T11 and T12 posterior elements
appear spared, and no posterior rib involvement is identified. No
epidural inflammation. No dural thickening. The posterior paraspinal
soft tissues at this level remain normal.

Furthermore, circumferential disc bulging at this level results in
mild spinal stenosis. No spinal cord signal abnormality.

MRI LUMBAR SPINE FINDINGS

Segmentation:  Normal, concordant with the thoracic numbering today.

Alignment: Stable mild straightening of lumbar lordosis and subtle
retrolisthesis of L5 on S1.

Vertebrae: Abnormal edema and enhancement in the right T11 and T12
vertebrae is well demonstrated on series 22, image 16. There is a
benign vertebral body hemangioma in the L1. No lumbar or upper
sacrum marrow edema or of acute osseous abnormality. Intact visible
sacrum and SI joints.

Conus medullaris: Mild spinal stenosis redemonstrated at T11-T12
corresponding to the lower thoracic cord level. The conus medullaris
is at L1 and appears normal. No abnormal intradural enhancement. No
dural thickening.

Paraspinal and other soft tissues: Right lateral paraspinal phlegmon
at T11-T12 redemonstrated and encompasses about 5 centimeters. As
stated above, no definite fluid or enhancement of the right lateral
disc. Trace paraspinal fluid signal but no discrete or drainable
fluid collection.

The lumbar paraspinal soft tissues are normal. The lumbar epidural
space is normal.

Cholelithiasis, otherwise negative visible abdominal viscera.

Disc levels:

Normal lumbar intervertebral disc signal and morphology except for
degenerative changes:

L4-L5: Mild disc desiccation and circumferential disc bulge. Mild to
moderate facet hypertrophy greater on the right. No spinal stenosis.
Mild L4 neural foraminal stenosis.

L5-S1: Disc space loss. Moderate size broad-based left lateral
recess disc extrusion (series 16, image 11 and series 19, image 40).
Mild facet hypertrophy. Severe left lateral recess stenosis at the
descending left S1 nerve level. Up to moderate left L5 neural
foraminal stenosis. No contralateral stenosis.
IMPRESSION: 1. Confluent inflammation within the right lateral T11 and T12
vertebral bodies, and in a 4-5 cm area of the lateral paraspinal
soft tissues surrounding chronic degenerative endplate osteophytes
at that level.
No definite abnormality of the intervening disc, but in this
clinical setting this constellation is more suspicious for Acute
Thoracic Osteomyelitis with a paraspinal phlegmon, rather than
reactive changes due to spine degeneration.
There is no epidural or paraspinal abscess. No posterior rib or
posterior element involvement identified. The inflammation does
involve the course of the right T12 nerve.
2. No other acute or inflammatory process identified in the thoracic
or lumbar spine.
3. Superimposed mild degenerative thoracic spinal stenosis at
T11-T12 related to disc bulging. And in the lumbar spine there is a
moderate size herniation of the L5-S1 disc into the left lateral
recess severely affecting the course of the left S1 nerve.

## 2019-01-29 IMAGING — DX DG CHEST 2V
3 series · 3 of 3 positions shown · non-contrast
Comparison: PA and lateral chest 04/13/2017.  CT chest 11/11/2016.

CLINICAL DATA: Fever, shortness of breath, cough and congestion
today.

EXAM:
CHEST  2 VIEW

[chest lat (1 of 2)]
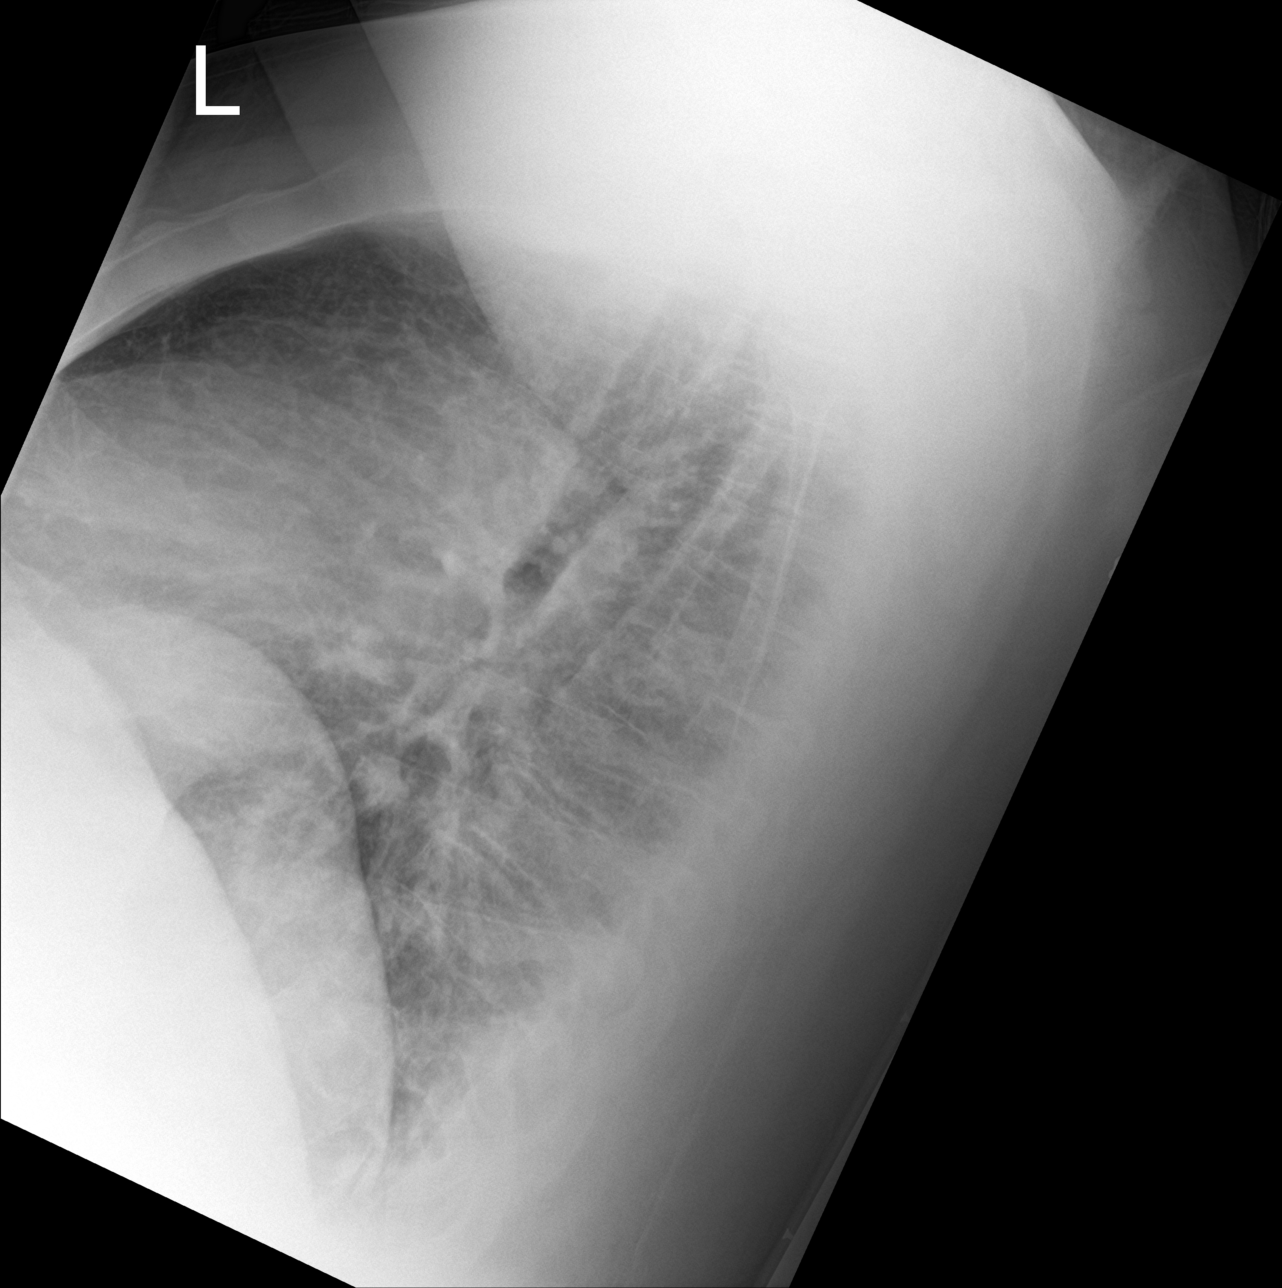

[chest ap]
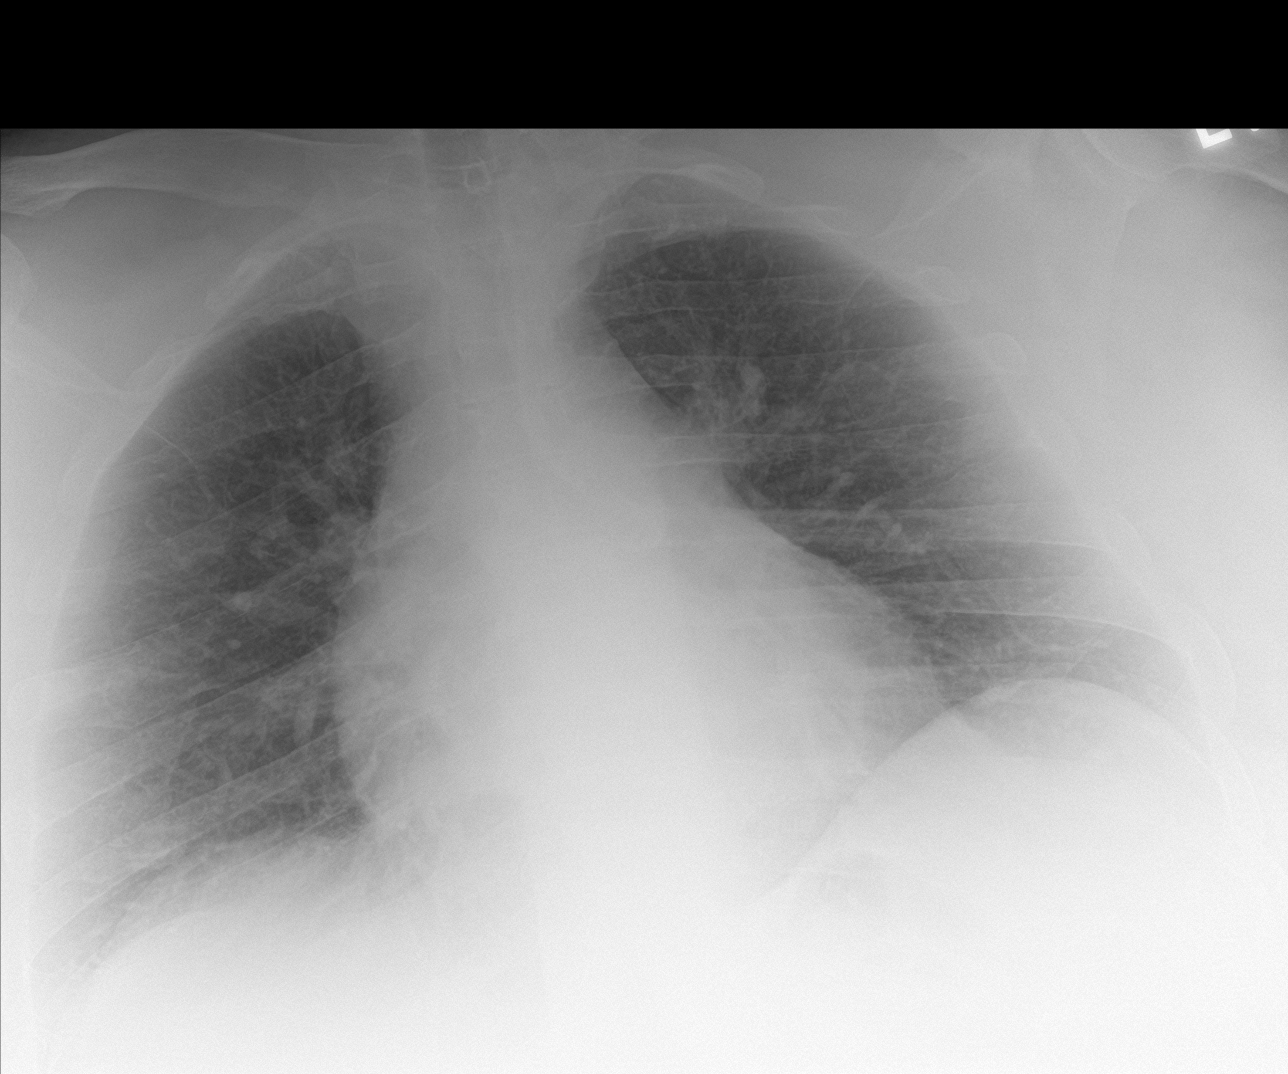

[chest lat (2 of 2)]
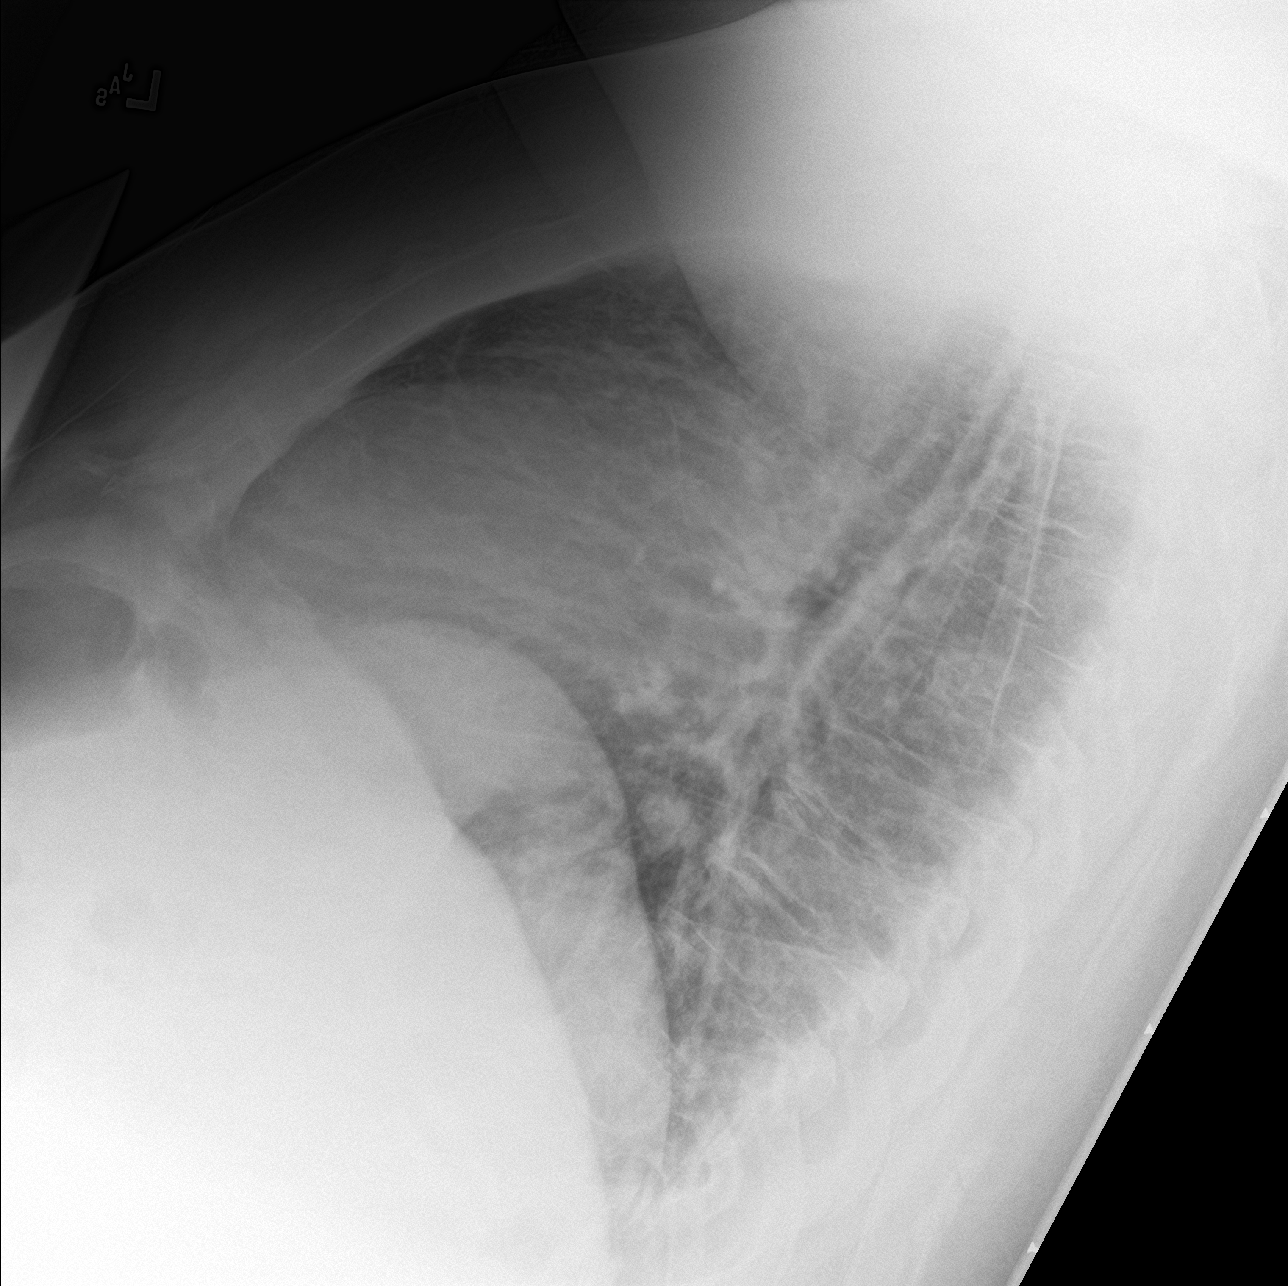

[3 of 3 positions shown; findings below may reference images not displayed]

FINDINGS: There is peribronchial thickening. No consolidative process,
pneumothorax or effusion. Heart size is normal. No acute bony
abnormality.
IMPRESSION: Peribronchial thickening suggestive of bronchitis. Otherwise
negative.

## 2019-02-14 IMAGING — MR MR LUMBAR SPINE WO/W CM
9 of 20 series · 18 of 48 positions shown · IV contrast (multihance)
Comparison: 05/01/2017

CLINICAL DATA: Back pain with new right lower extremity weakness.
Recent diagnosis of T11-T12 discitis-osteomyelitis.

EXAM:
MRI THORACIC AND LUMBAR SPINE WITHOUT AND WITH CONTRAST
TECHNIQUE: Multiplanar and multiecho pulse sequences of the thoracic and lumbar
spine were obtained without and with intravenous contrast.
CONTRAST:  20mL MULTIHANCE GADOBENATE DIMEGLUMINE 529 MG/ML IV SOLN

[Series 2: T1 · sagittal · 3.0mm · 0.90mm/px · 1 of 16 slices shown (1 of 4)]
[im 1/16]
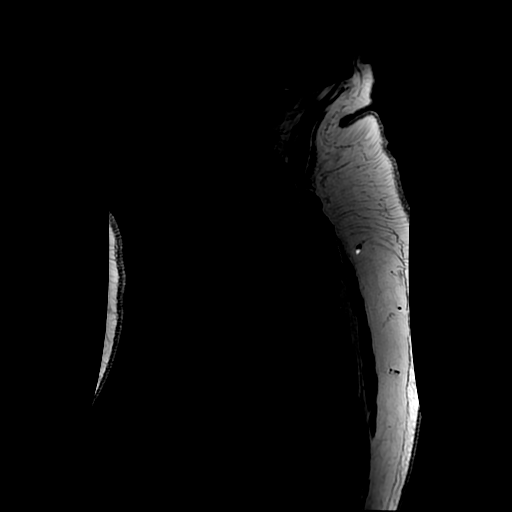

[Series 3: T2 · sagittal · 3.0mm · 0.66mm/px · 1 of 15 slices shown (1 of 5)]
[im 1/15]
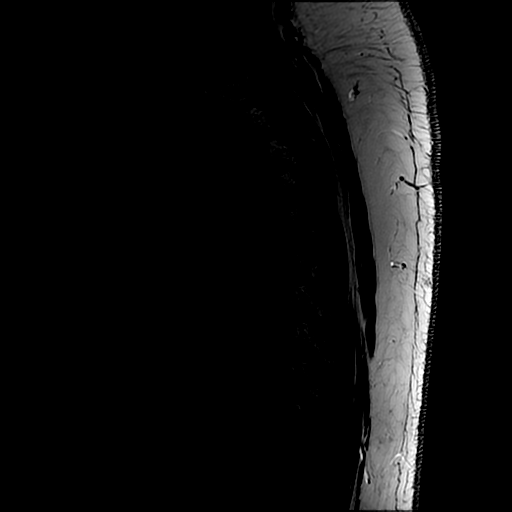

[Series 7: T1 · sagittal · 3.0mm · 0.66mm/px · 1 of 16 slices shown (2 of 4)]
[im 1/16]
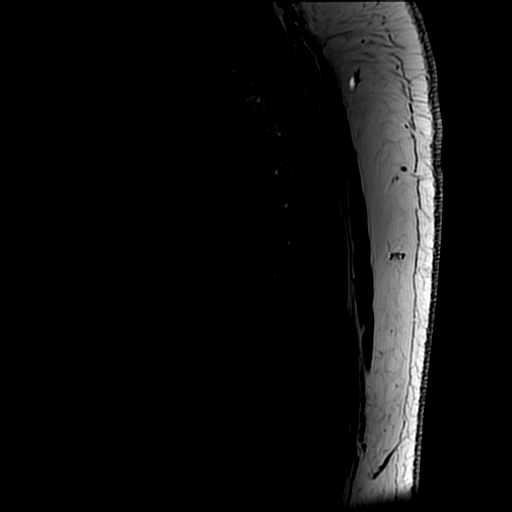

[Series 8: T2 · axial · 4.0mm · 0.43mm/px · z∈[-183,-35]mm · 2 of 29 slices shown (2 of 5)]
[im 1/29]
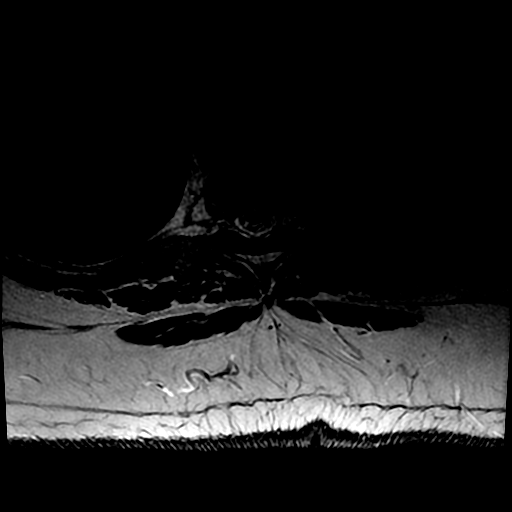
[im 29/29]
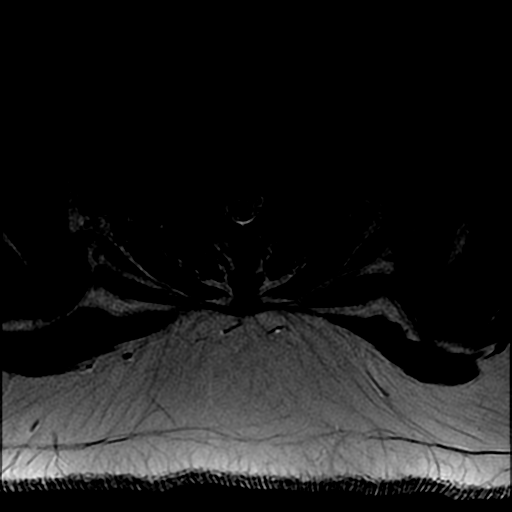

[Series 10: T1 · axial · non-contrast · 4.0mm · 0.43mm/px · z∈[-183,-35]mm · 2 of 29 slices shown (3 of 4)]
[im 1/29]
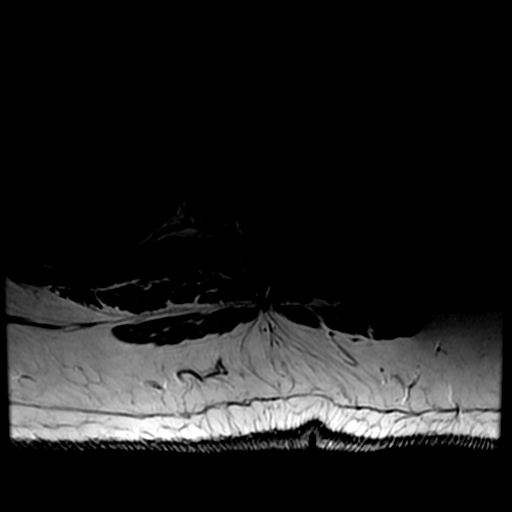
[im 29/29]
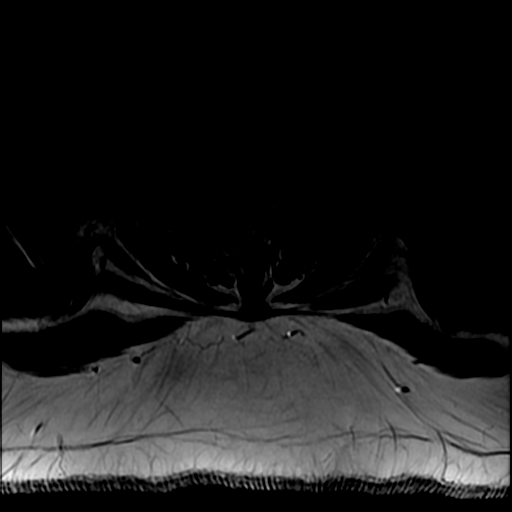

[Series 12: T2 · axial · 4.0mm · 0.43mm/px · z∈[-314,-160]mm · 3 of 29 slices shown (3 of 5)]
[im 1/29]
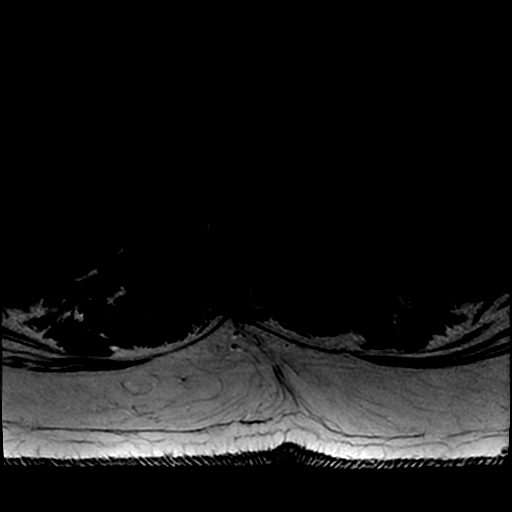
[im 15/29]
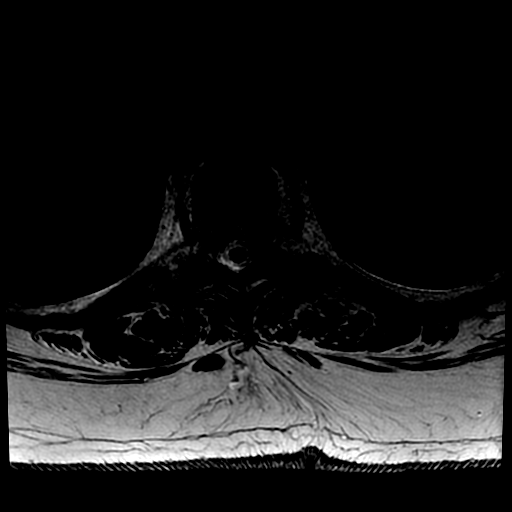
[im 29/29]
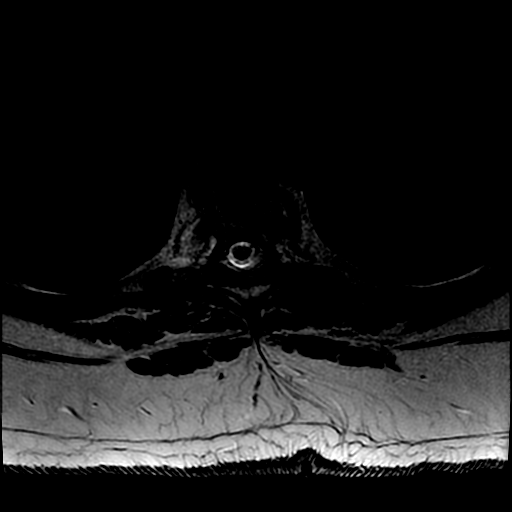

[Series 13: T1 · axial · non-contrast · 4.0mm · 0.43mm/px · z∈[-314,-237]mm · 2 of 29 slices shown (4 of 4)]
[im 1/29]
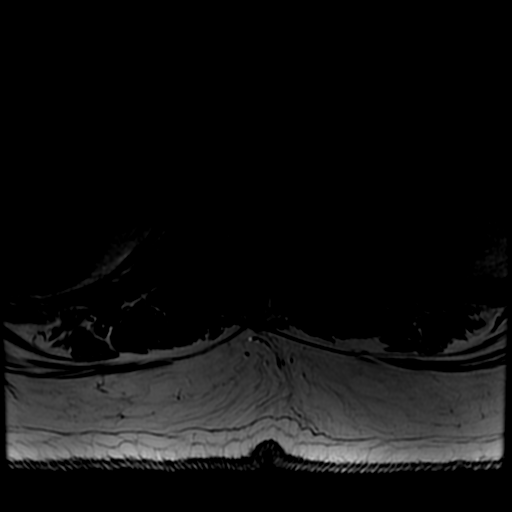
[im 15/29]
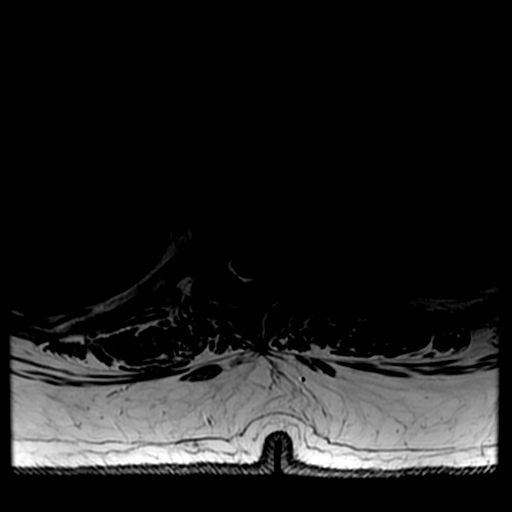

[Series 16: T2 · sagittal · 4.0mm · 0.55mm/px · 2 of 16 slices shown (4 of 5)]
[im 1/16]
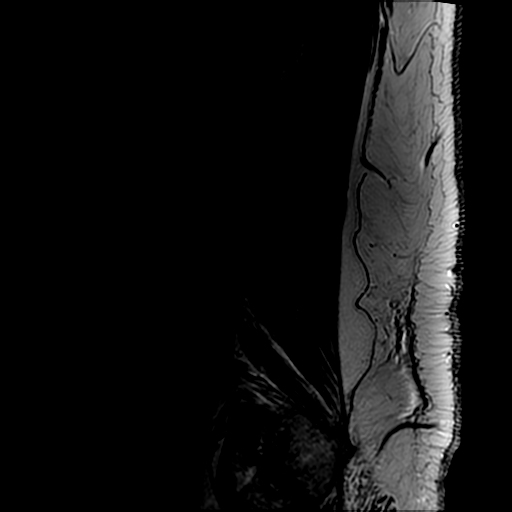
[im 16/16]
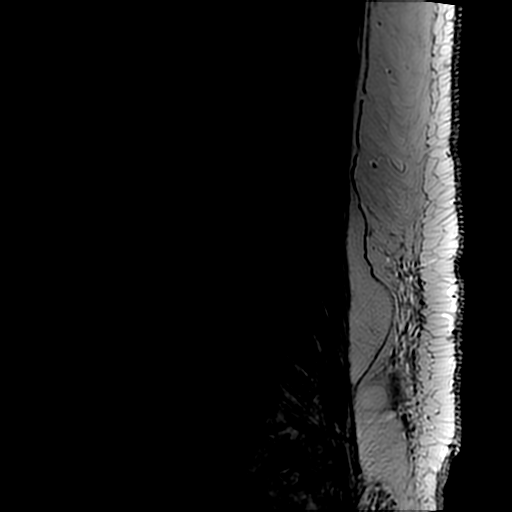

[Series 19: T2 · axial · 4.0mm · 0.43mm/px · z∈[-531,-298]mm · 4 of 43 slices shown (5 of 5)]
[im 1/43]
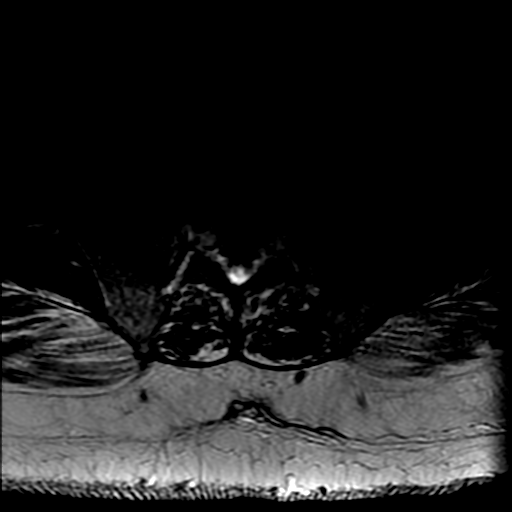
[im 15/43]
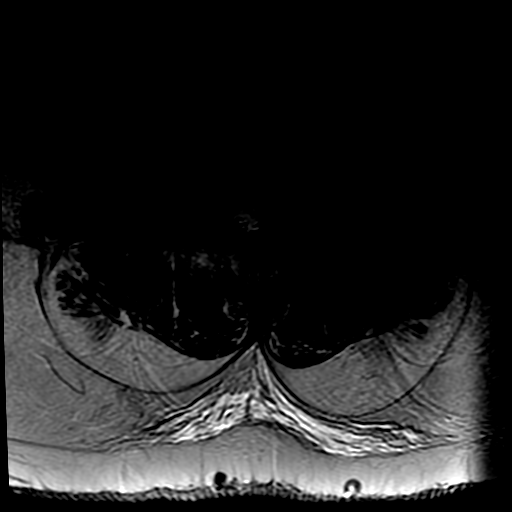
[im 29/43]
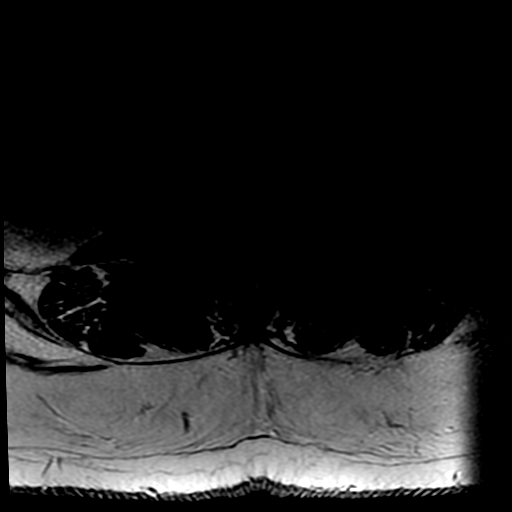
[im 43/43]
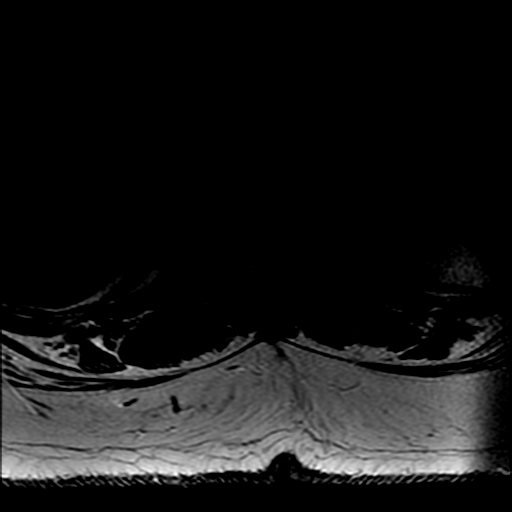

[18 of 48 positions shown; findings below may reference images not displayed]

FINDINGS: Examination is degraded by motion. There are also artifacts related
to patient body habitus.

MRI THORACIC SPINE FINDINGS

Alignment:  Physiologic.

Vertebrae: There is hyperintense T2-weighted signal within the T11
and T12 vertebral bodies. The marrow signal abnormality is slightly
greater than on the prior study, particularly at T11. There is mild
T2 hyperintensity of the disc space. The degree of contrast
enhancement is unchanged. There is mild enhancement in the
prevertebral soft tissues.

Cord:  Normal signal and morphology.

Paraspinal and other soft tissues: Mild prevertebral signal
abnormality at the level T11-T12.

Disc levels:

No spinal canal stenosis.

MRI LUMBAR SPINE FINDINGS

Segmentation:  Standard.

Alignment:  Physiologic.

Vertebrae:  L1 hemangioma, unchanged.

Conus medullaris: Extends to the L1 level and appears normal.

Paraspinal and other soft tissues: Negative.

Disc levels:

Unchanged left subarticular disc protrusion at L5-S1 displacing the
descending left S1 nerve root. Unchanged mild degenerative disc
disease at the other levels.
IMPRESSION: 1. Study degraded by motion and artifacts related to patient body
habitus.
2. Aside from mild evolution of the bone marrow signal changes at
the T11-T12 level, the examination is unchanged from 05/01/2017.
3. Limited assessment of inflammatory changes surrounding the right
T11 and T12 nerve roots secondary to patient motion. This is a
possible source of the right lower extremity radicular symptoms.
4. Unchanged large left subarticular disc protrusion at L5-S1 that
displaces the descending left S1 nerve root.

## 2019-02-14 IMAGING — CR DG CHEST 2V
2 series · 2 of 2 positions shown · non-contrast
Comparison: 05/01/2017

CLINICAL DATA: Fever

EXAM:
CHEST - 2 VIEW

[chest pa]
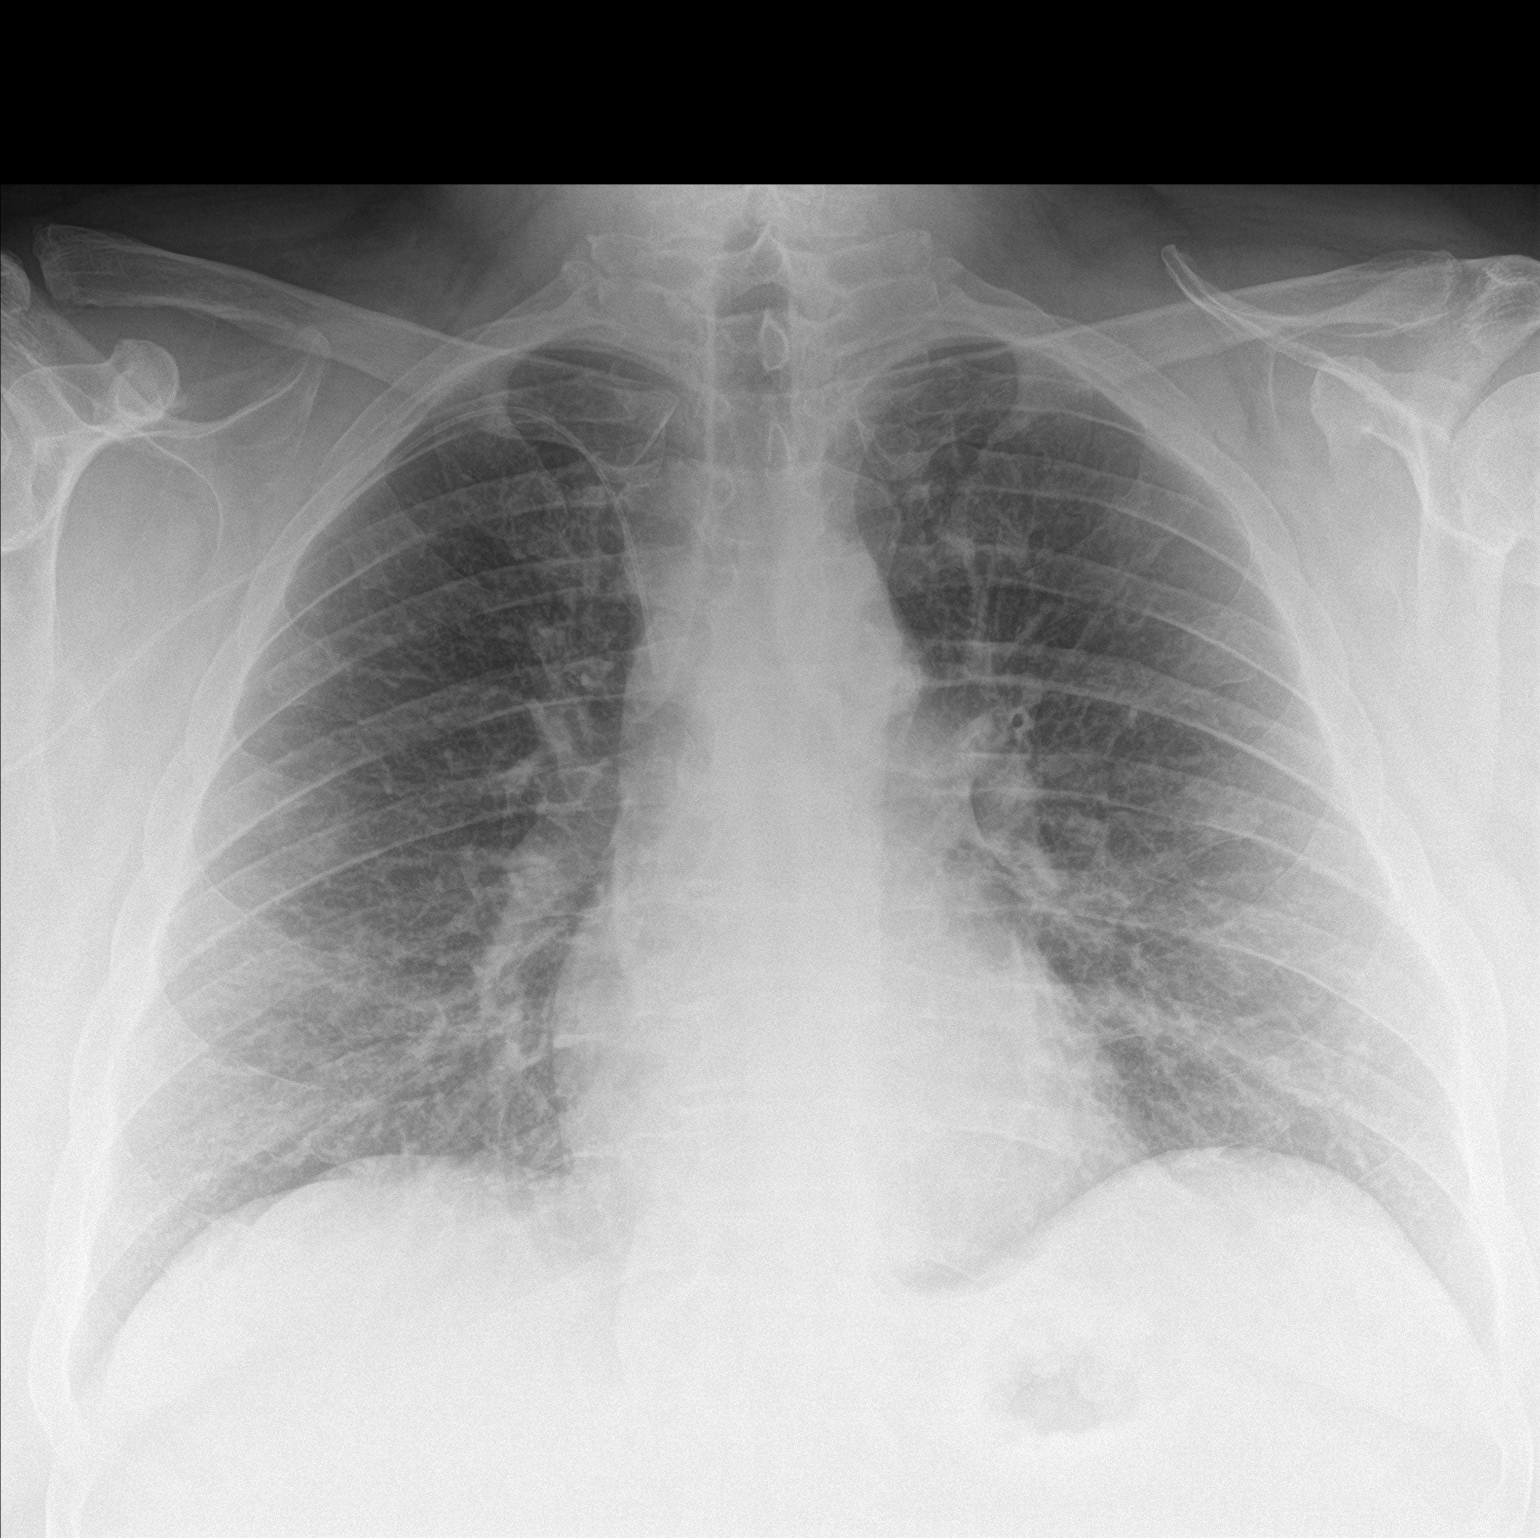

[chest lat]
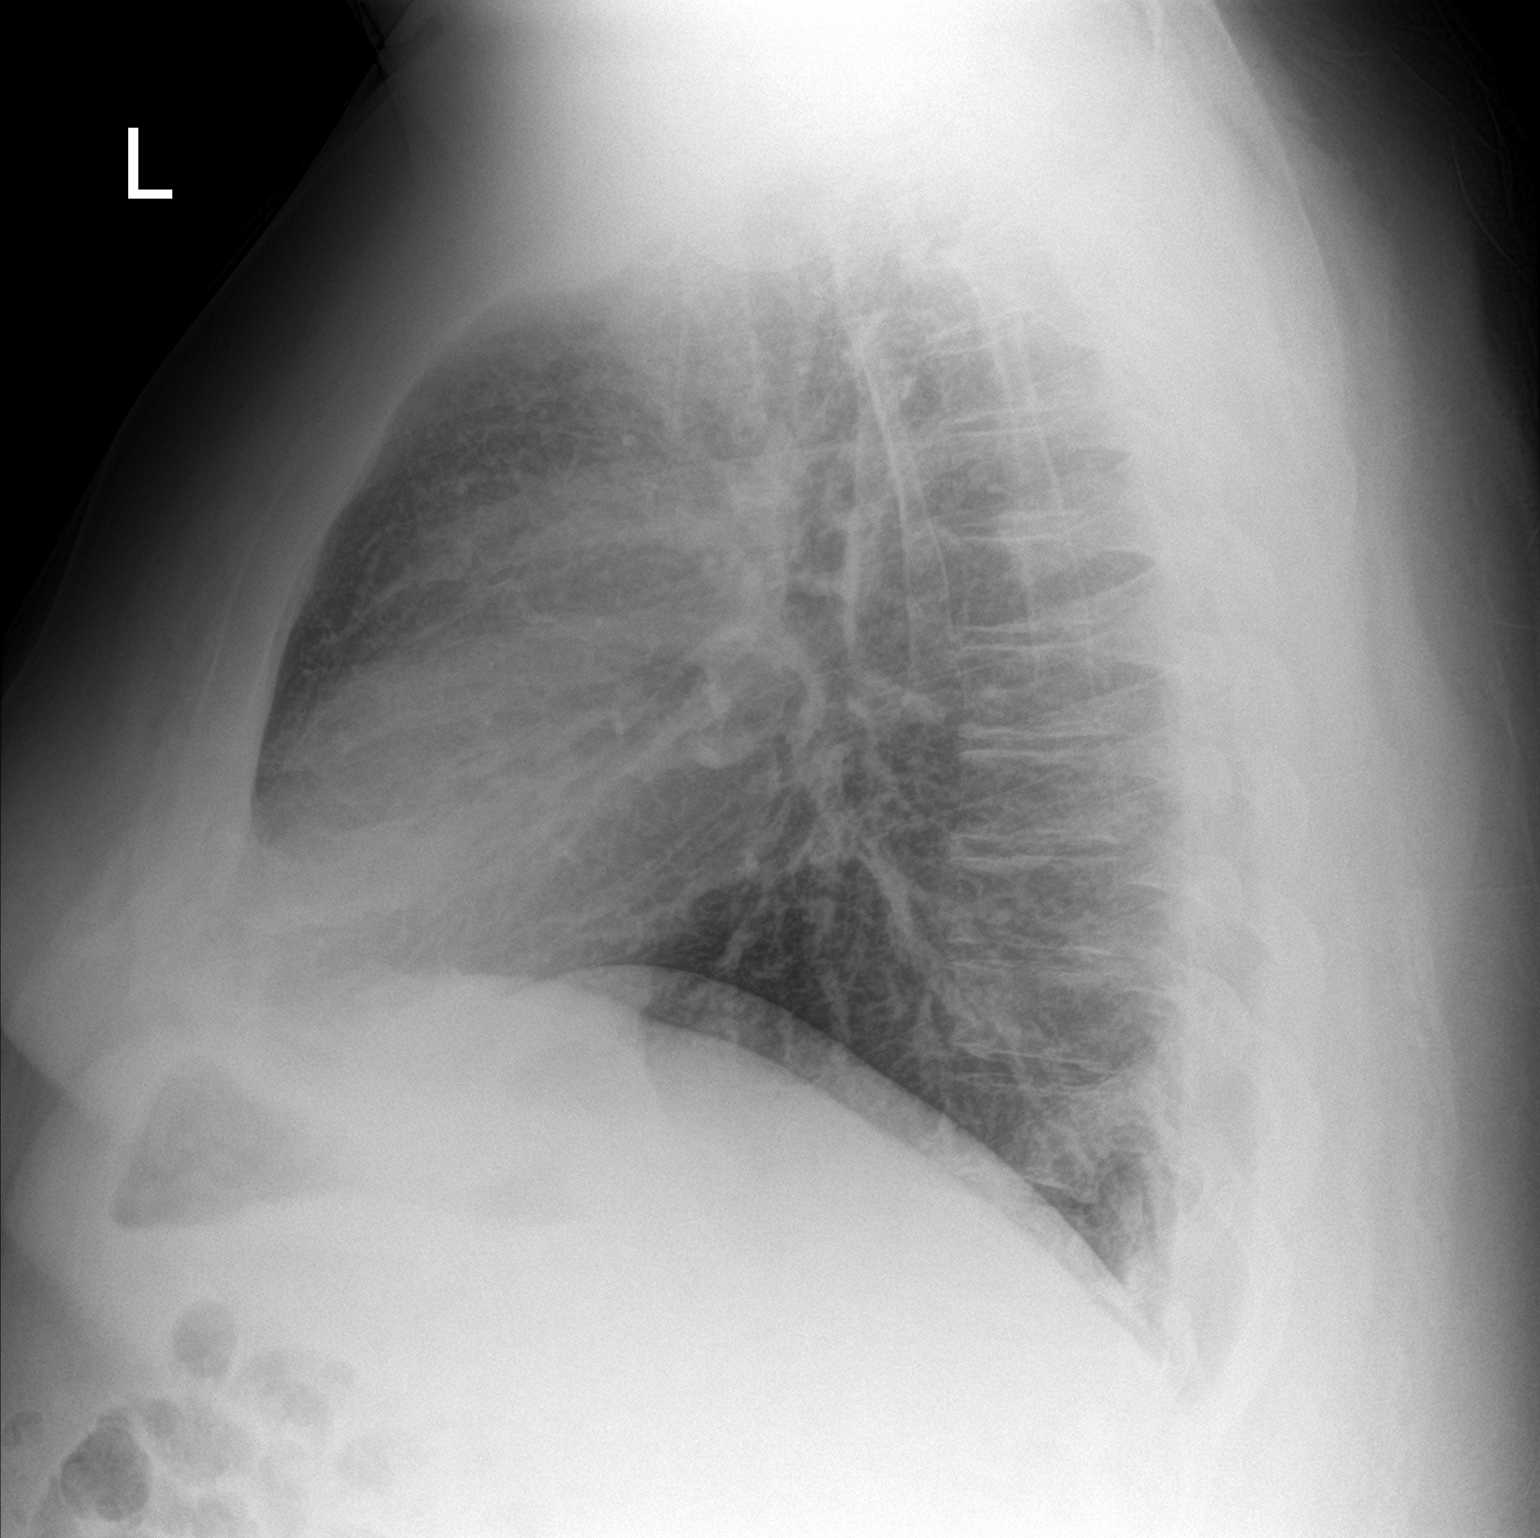

[2 of 2 positions shown; findings below may reference images not displayed]

FINDINGS: Right arm PICC tip in the proximal SVC.

Negative for infiltrate or effusion or heart failure. No mass
lesion.
IMPRESSION: Right arm PICC tip proximal SVC.

No acute cardiopulmonary abnormality.
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
## 2019-04-02 DEATH — deceased

## 2019-09-15 IMAGING — CT CT NECK W/O CM
3 of 5 series · 13 of 33 positions shown, 16 images · IV contrast (Omni 300)
Comparison: Cervical spine CT 06/27/2017.  Neck CT 10/04/2015.

CLINICAL DATA: 41-year-old male with sore throat and tongue
swelling after beginning a new medication recently.

EXAM:
CT NECK WITHOUT CONTRAST
TECHNIQUE: Multidetector CT imaging of the neck was performed following the
standard protocol without intravenous contrast.

[Series 6: neck 2.0 st · sagittal · 0.43mm/px · 5 of 101 slices shown, 6 images (1 of 2)]
[im 34/101  bone]
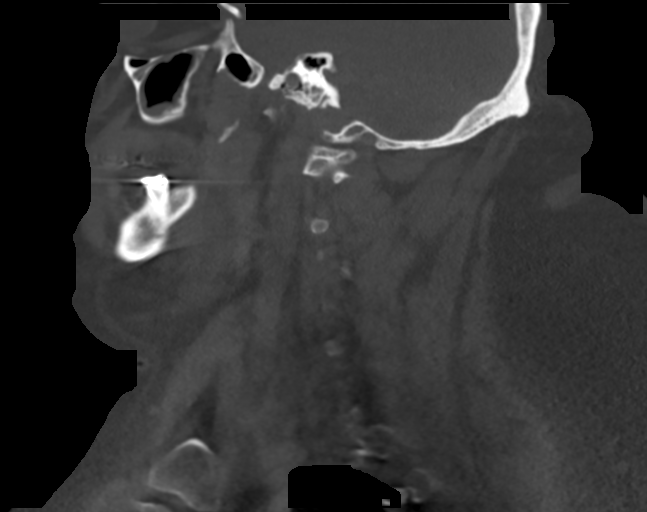
[im 42/101  bone]
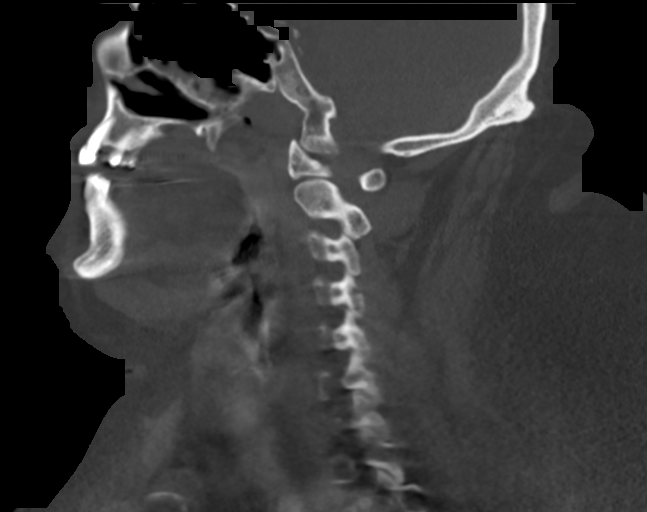
[im 51/101  soft-tissue]
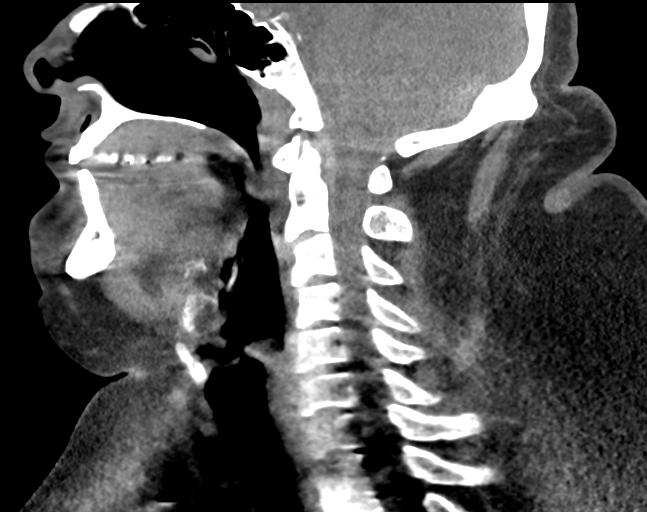
[im 51/101  bone]
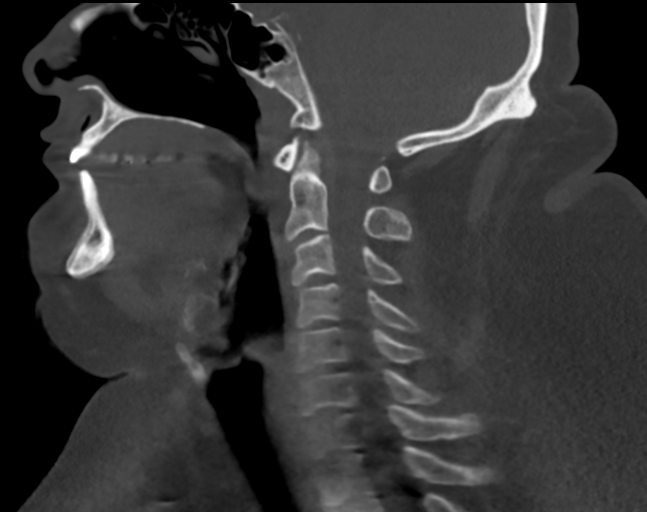
[im 59/101  bone]
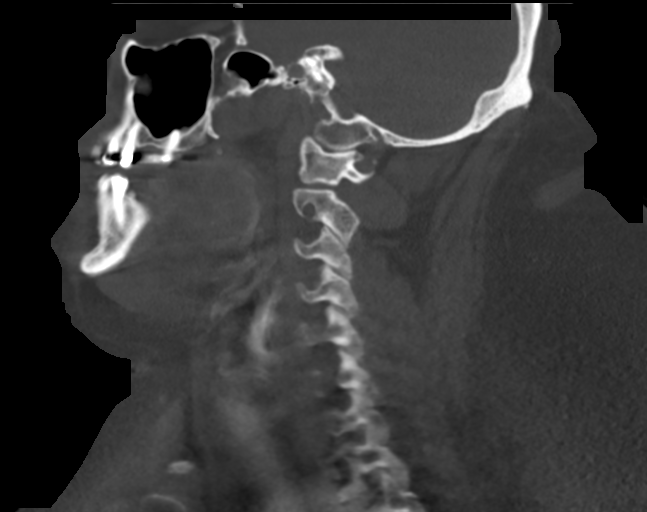
[im 67/101  bone]
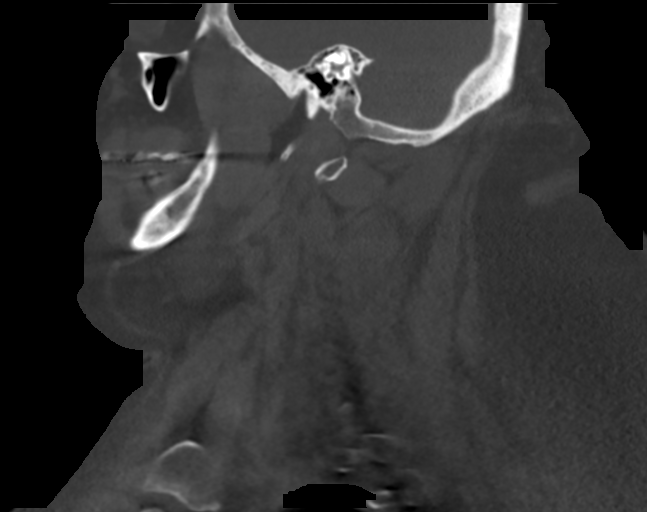

[Series 7: neck 2.0 st · coronal · 0.43mm/px · 3 of 130 slices shown (2 of 2)]
[im 26/130  bone]
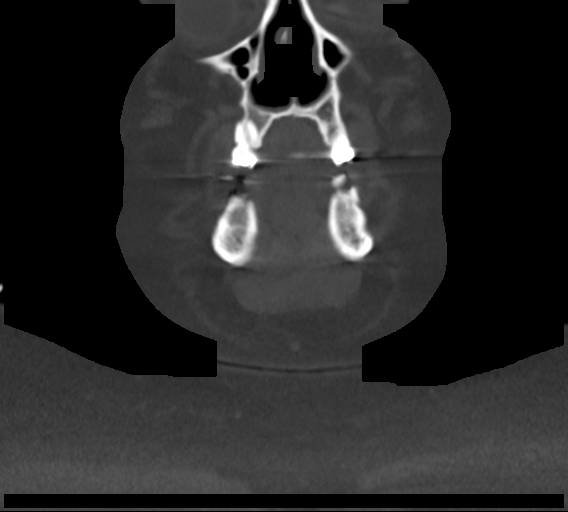
[im 52/130  bone]
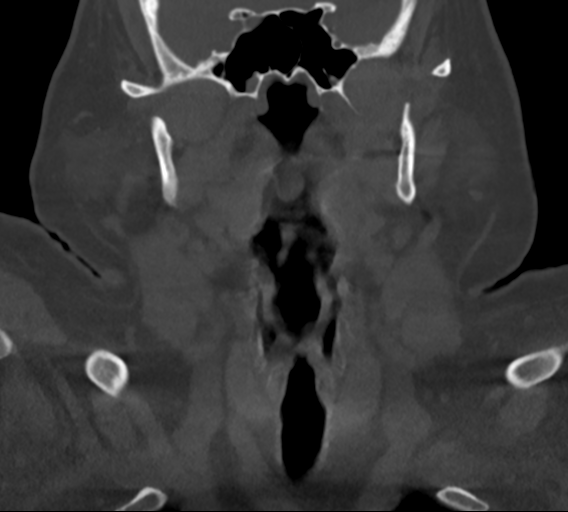
[im 78/130  bone]
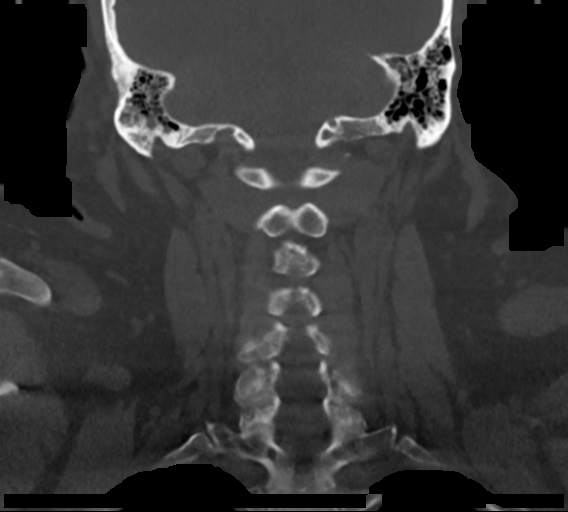

[Series 8: neck 2.0 st orthogonal · axial · 0.46mm/px · z∈[-236,-79]mm · 5 of 121 slices shown, 7 images]
[im 21/121  soft-tissue]
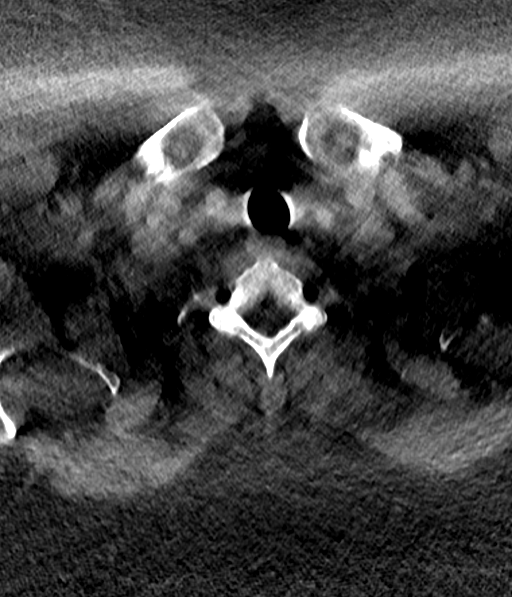
[im 21/121  bone]
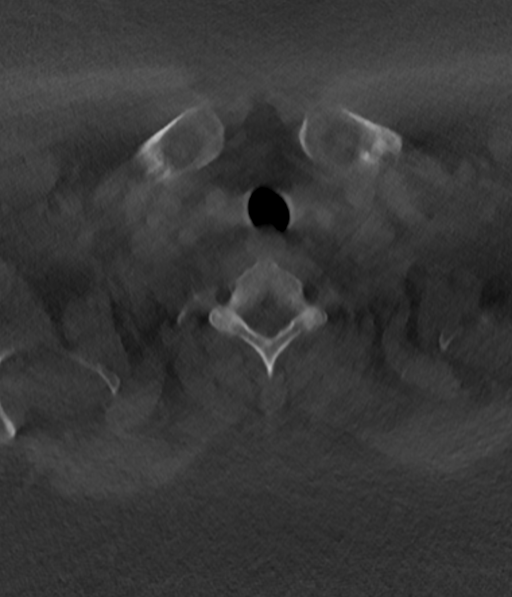
[im 41/121  bone]
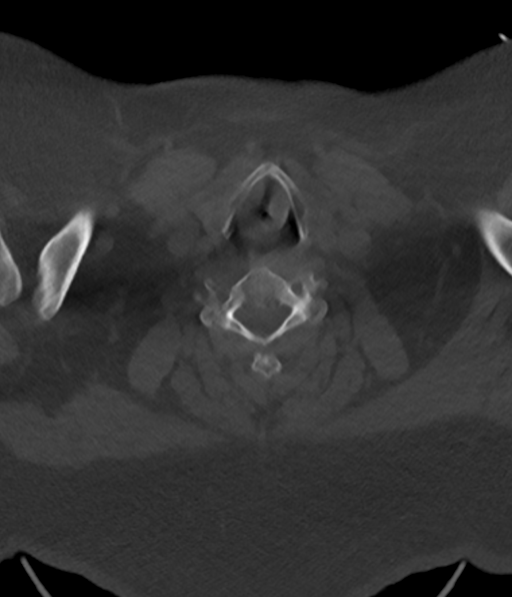
[im 61/121  bone]
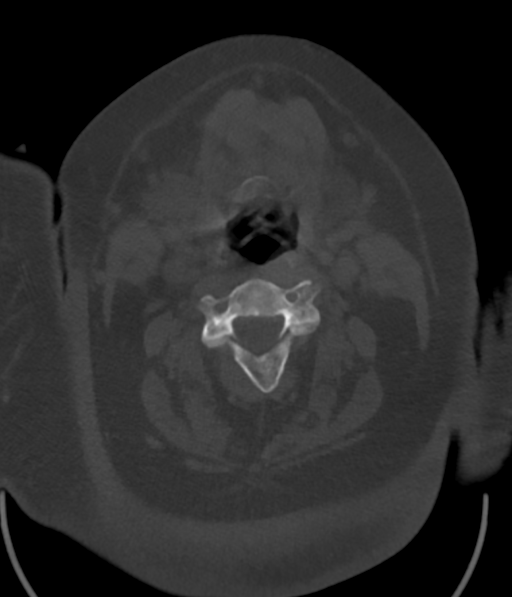
[im 81/121  bone]
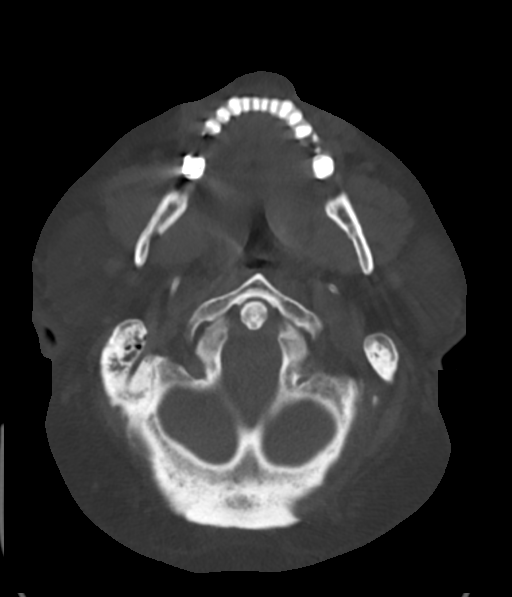
[im 101/121  soft-tissue]
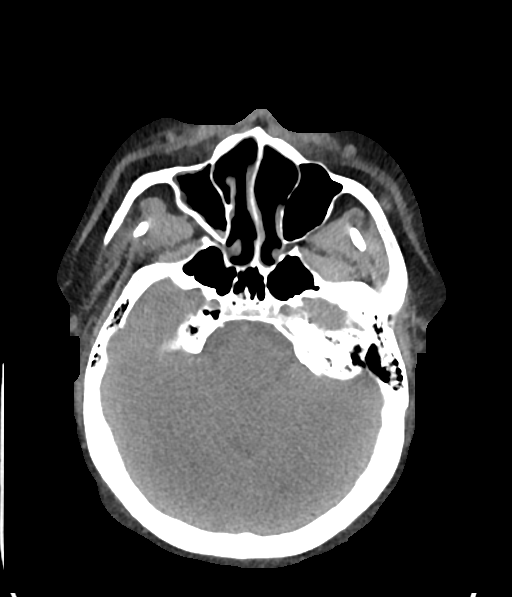
[im 101/121  bone]
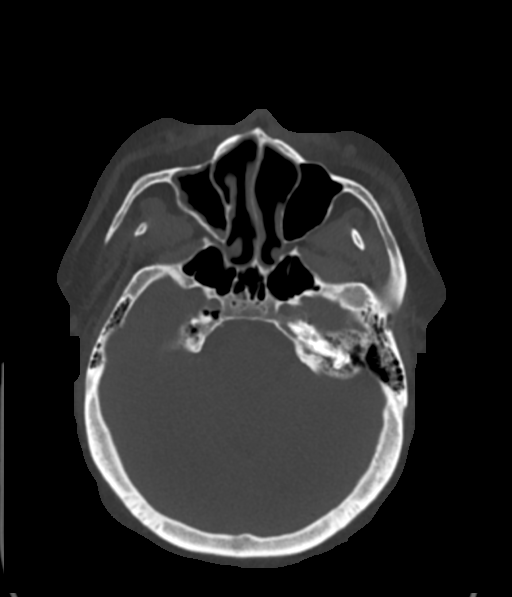

[13 of 33 positions shown; findings below may reference images not displayed]

FINDINGS: Pharynx and larynx: Large body habitus with streak artifact at the
thoracic inlet.

Motion artifact at the larynx and hypopharynx. The visible
epiglottis is nonthickened. No laryngeal or hypopharyngeal edema is
evident.

Motion artifact continues to the soft palate. The nasopharynx
appears stable since 4850. Negative parapharyngeal spaces. Grossly
negative retropharyngeal space.

No oral tongue abnormality is evident.

Salivary glands: Sublingual space and submandibular glands appear
stable and negative allowing for some motion. Stable and negative
parotid glands.

Thyroid: Negative.

Lymph nodes: Bilateral cervical lymph nodes are stable since 4850
and normal.

Vascular: Vascular patency is not evaluated in the absence of IV
contrast.

Limited intracranial: Negative.

Visualized orbits: Negative.

Mastoids and visualized paranasal sinuses: Sphenoid sinus disease
seen in 4850 has resolved. There is mild moderate new mucosal
thickening in the right maxillary sinus now. The other visible
paranasal sinuses are clear. There are mild chronic mastoid
effusions. Tympanic cavities remain clear.

Skeleton: Chronic right lamina papyracea fracture. Carious bilateral
mandible anterior molars. No acute osseous abnormality identified.

Upper chest: Negative visible lung apices.
IMPRESSION: 1. Suboptimal due to pharyngeal motion artifact and lack of IV
contrast but no pharyngeal or laryngeal edema is evident. There is
no lymphadenopathy or inflammation identified in the neck.
2. Mild to moderate right maxillary sinus mucosal thickening.
3. Carious mandible anterior molars.

## 2019-11-25 IMAGING — MR MR THORACIC SPINE W/O CM
3 series · 19 of 48 positions shown · non-contrast
Comparison: 05/17/2017

CLINICAL DATA: Back pain with cancer or infection suspected.

EXAM:
MRI THORACIC SPINE WITHOUT CONTRAST
TECHNIQUE: Multiplanar, multisequence MR imaging of the thoracic spine was
performed. No intravenous contrast was administered.

[Series 2: T1 · sagittal · 3.0mm · 0.90mm/px · 7 of 16 slices shown]
[im 2/16]
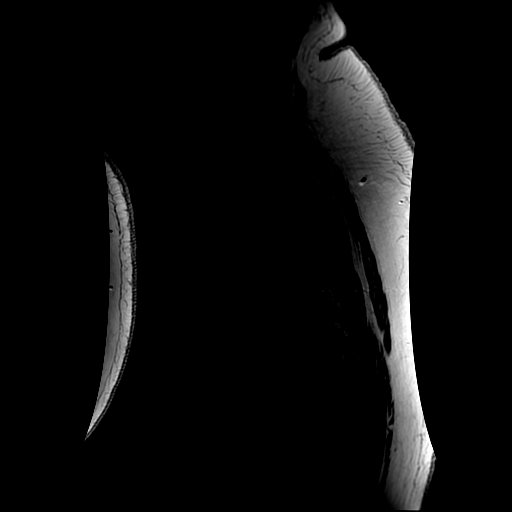
[im 3/16]
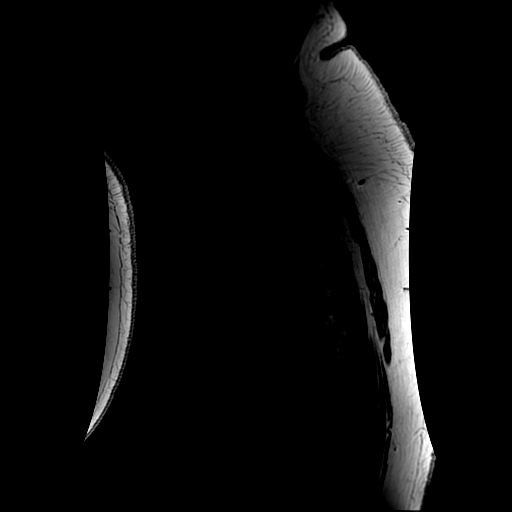
[im 4/16]
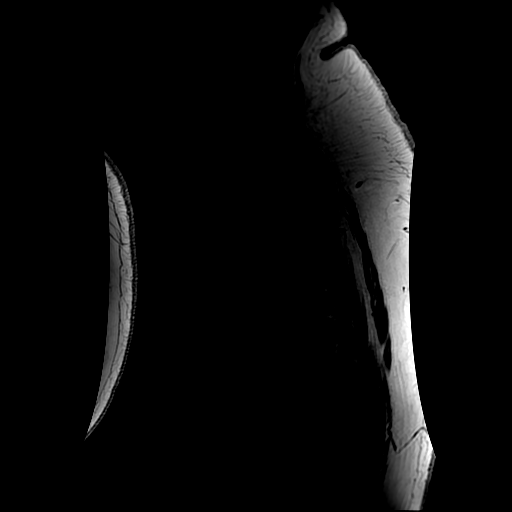
[im 6/16]
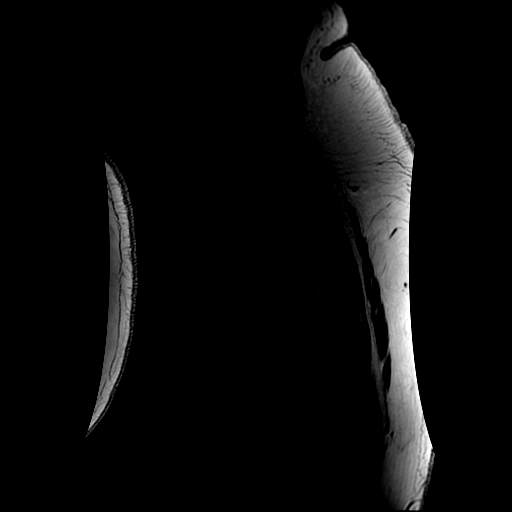
[im 8/16]
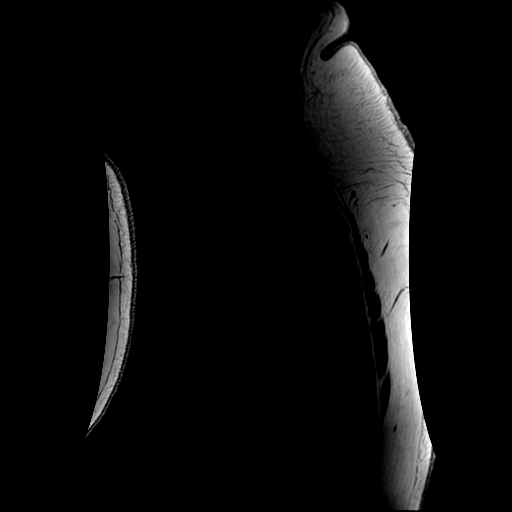
[im 9/16]
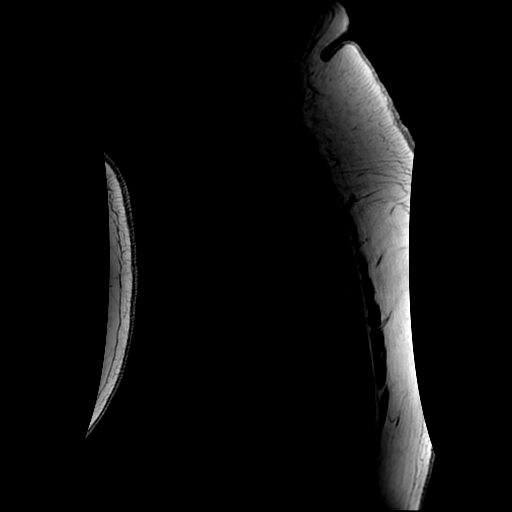
[im 14/16]
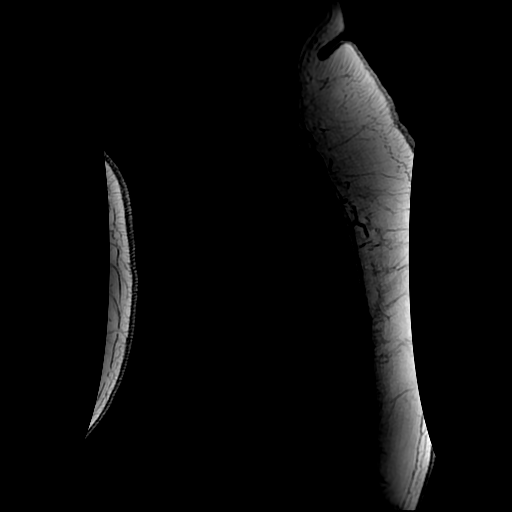

[Series 3: T2 · sagittal · 3.0mm · 0.66mm/px · 9 of 16 slices shown]
[im 1/16]
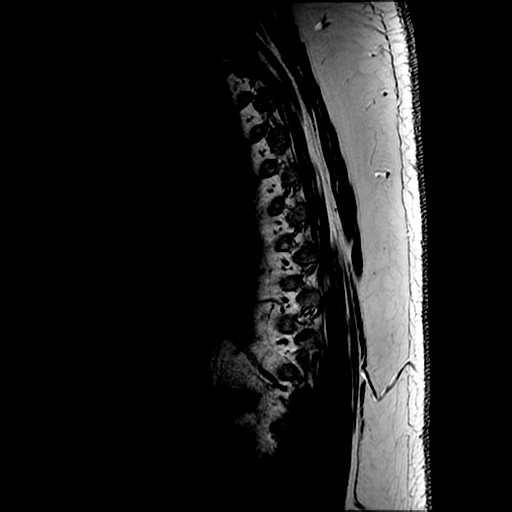
[im 3/16]
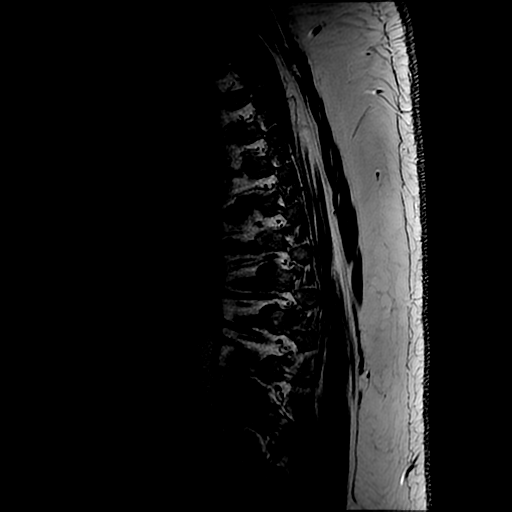
[im 5/16]
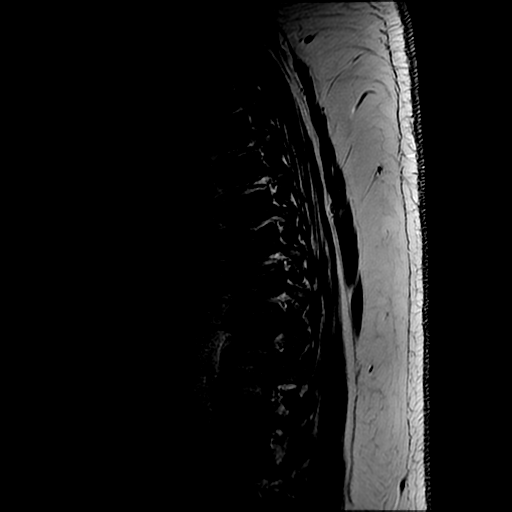
[im 7/16]
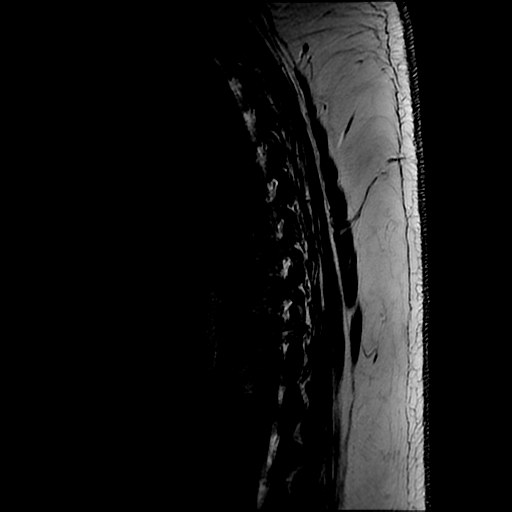
[im 8/16]
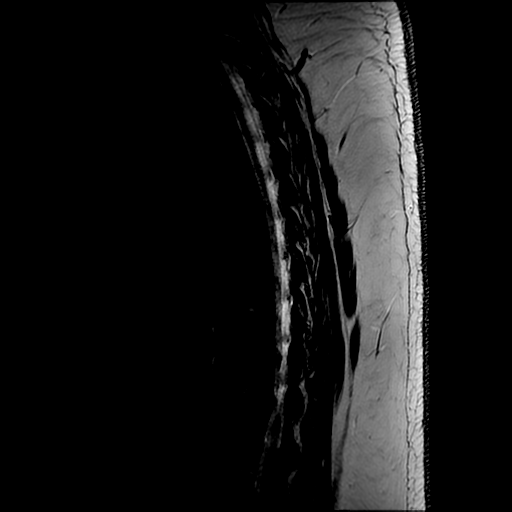
[im 9/16]
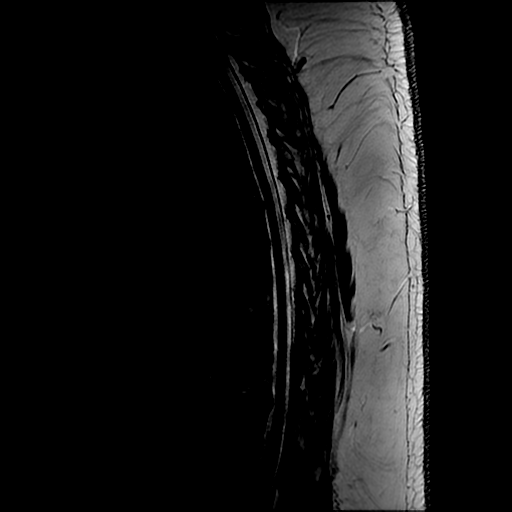
[im 11/16]
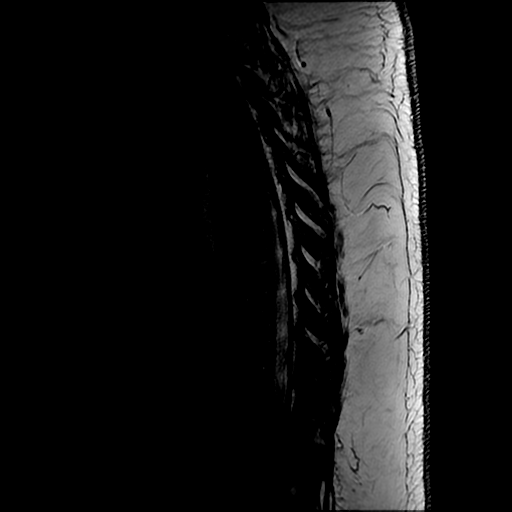
[im 13/16]
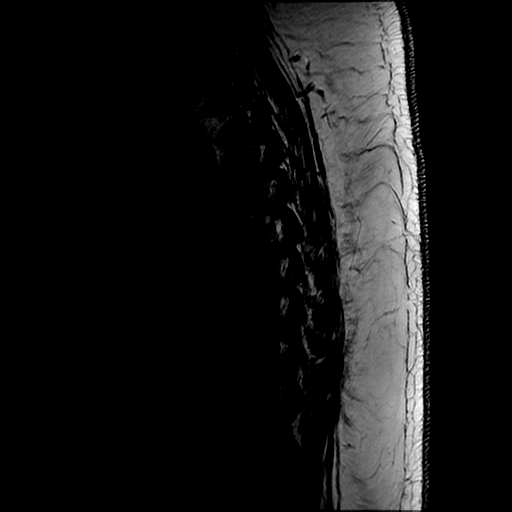
[im 16/16]
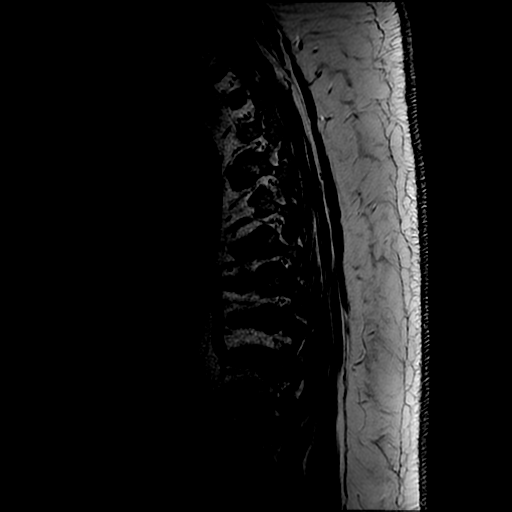

[Series 4: STIR · sagittal · 3.0mm · 0.66mm/px · 3 of 18 slices shown]
[im 3/18]
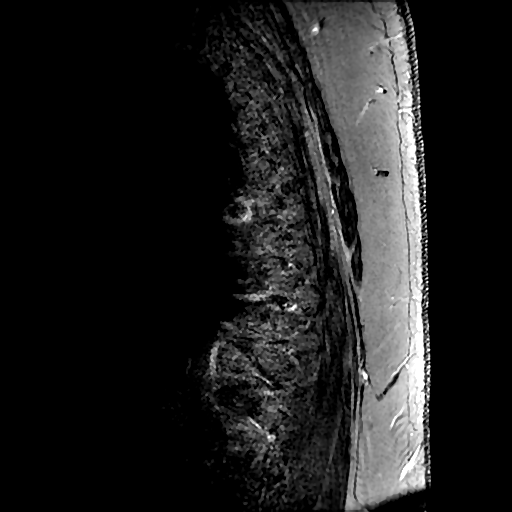
[im 9/18]
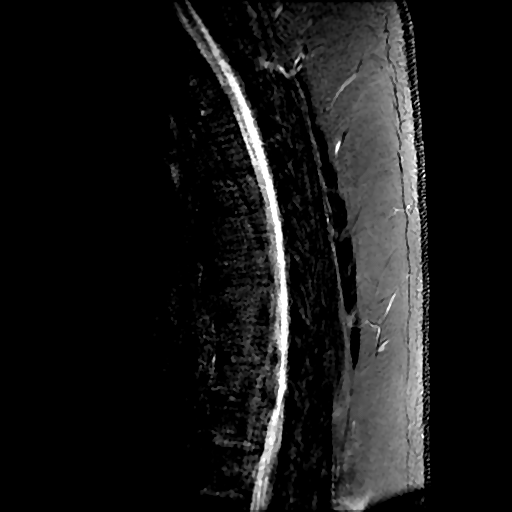
[im 15/18]
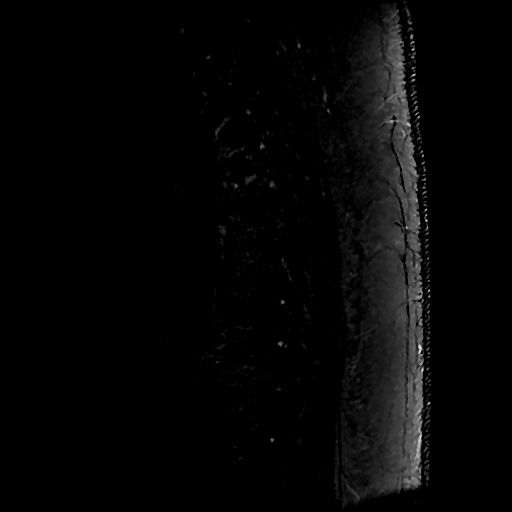

[19 of 48 positions shown; findings below may reference images not displayed]

FINDINGS: Alignment:  Normal

Vertebrae: No residual marrow edema as noted on April 2017 study. No
disc hyperintensity or endplate destruction. No fracture or
discitis. A L1 hemangioma is noted. Remote superior endplate
fractures of T1, T2, and T3. No acute fracture.

Cord:  Normal signal and morphology

Paraspinal and other soft tissues: Minor paravertebral edematous
signal around far-lateral osteophytes at T5-6 and T6-7.

Disc levels:

Intermittent spondylotic spurring. Tiny disc protrusions seen at
T6-7, T7-8, and T11-12. The canal and foramina remain widely patent.
IMPRESSION: No acute finding. Paravertebral inflammation at T11-12 on prior
study has resolved.

## 2019-12-12 IMAGING — CR DG HAND COMPLETE 3+V*R*
4 series · 4 of 4 positions shown · non-contrast
Comparison: Radiographs June 27, 2017.

CLINICAL DATA: Right hand infection.

EXAM:
RIGHT HAND - COMPLETE 3+ VIEW

[x hand pa right]
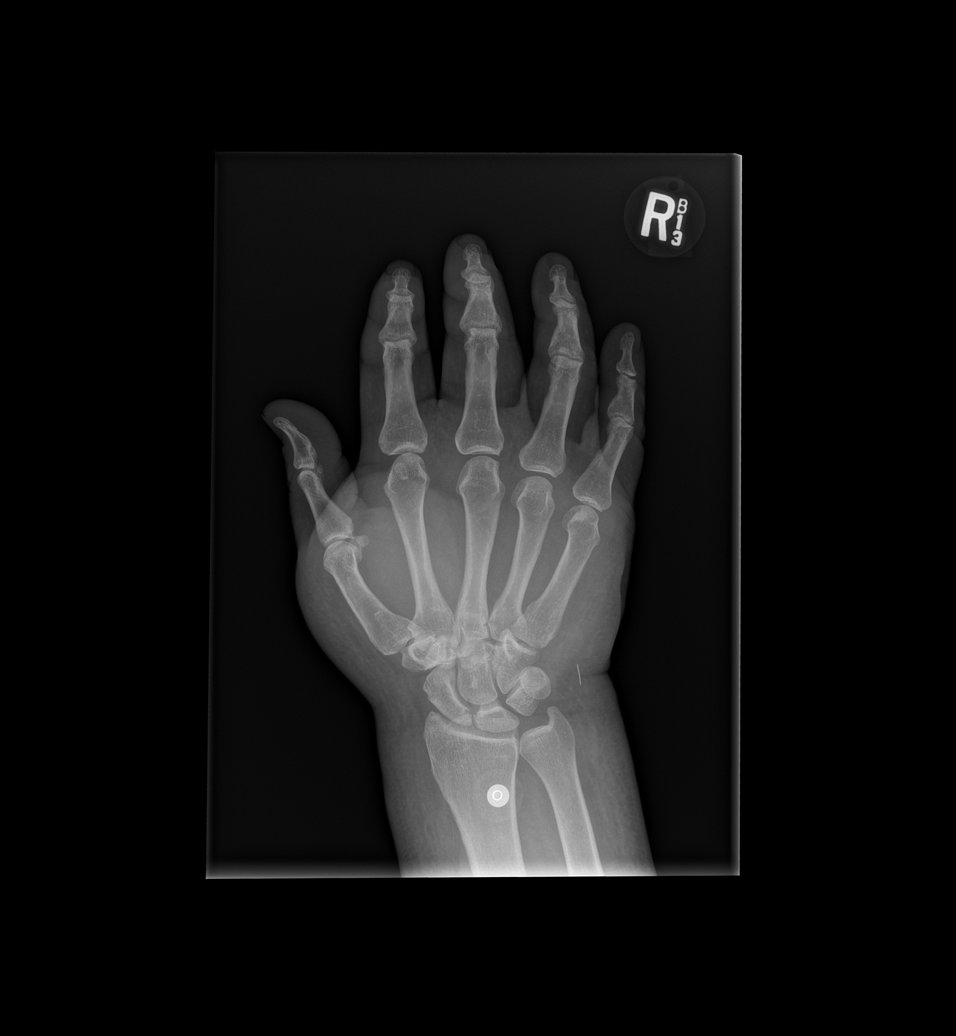

[x hand obl right]
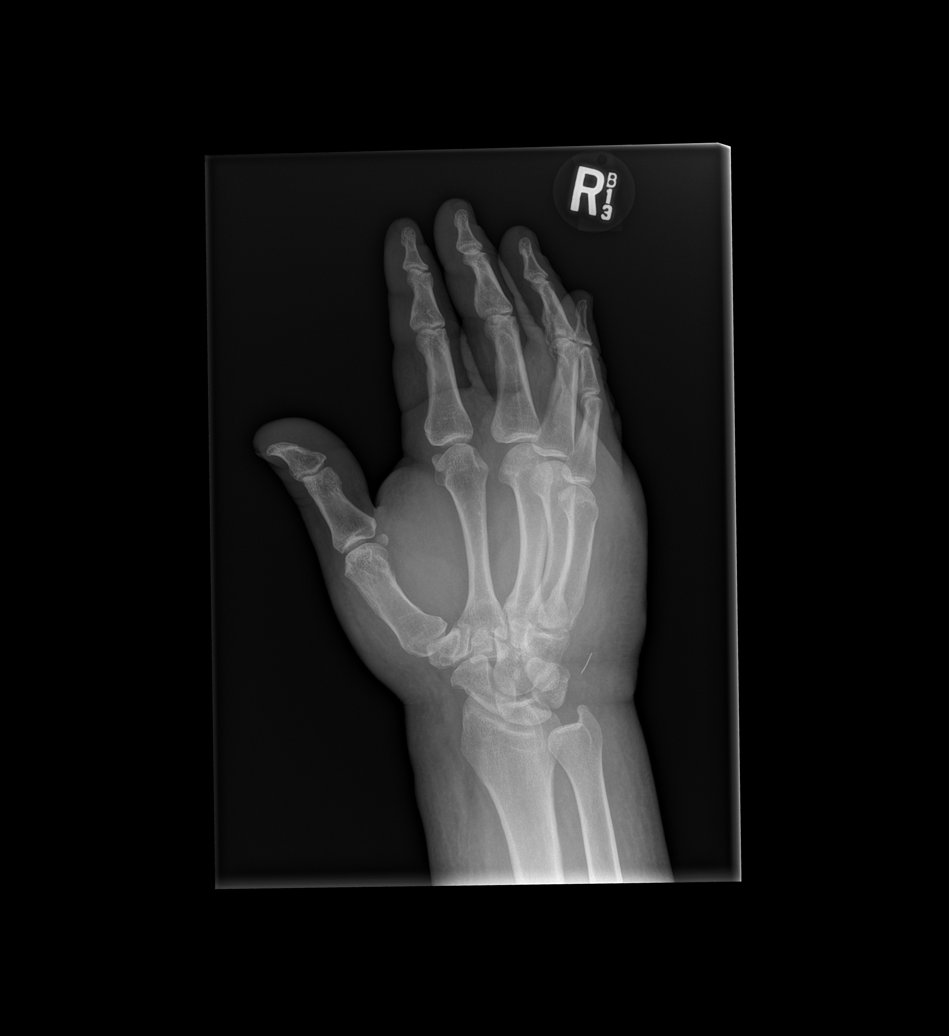

[x hand lat right (1 of 2)]
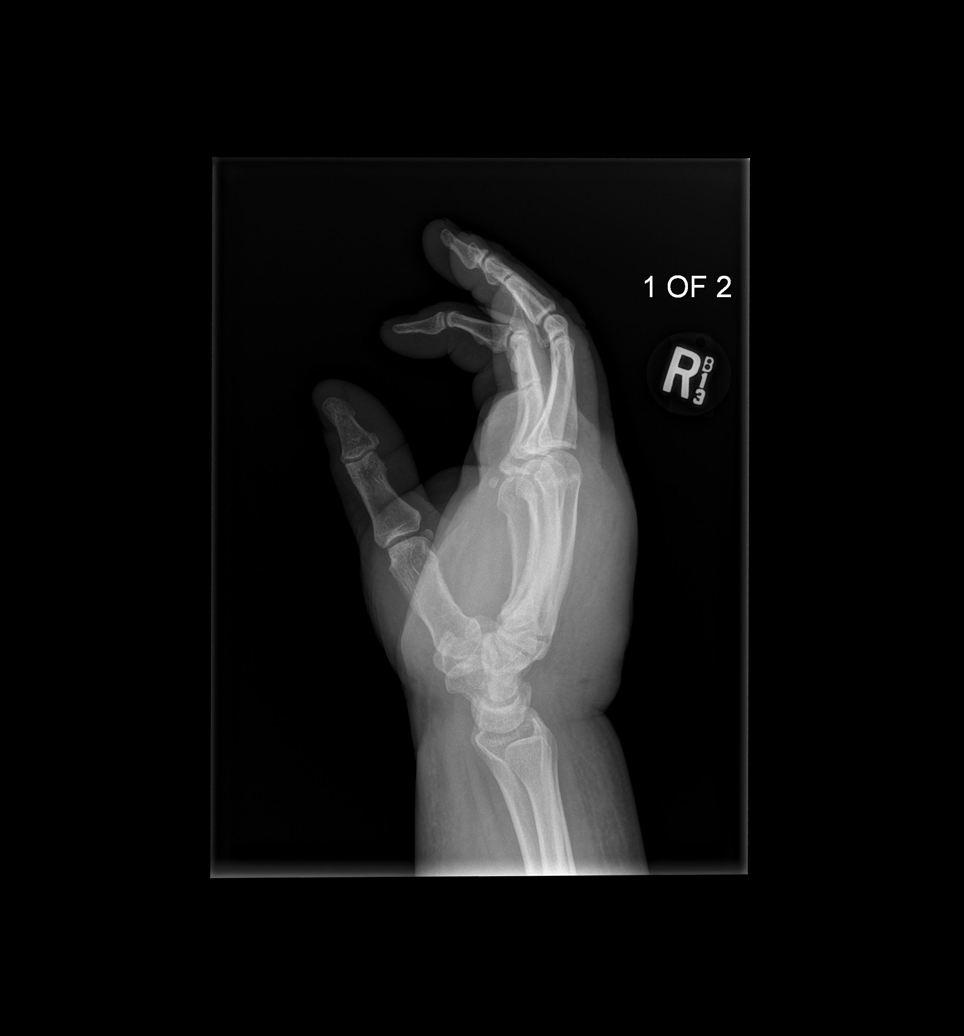

[x hand lat right (2 of 2)]
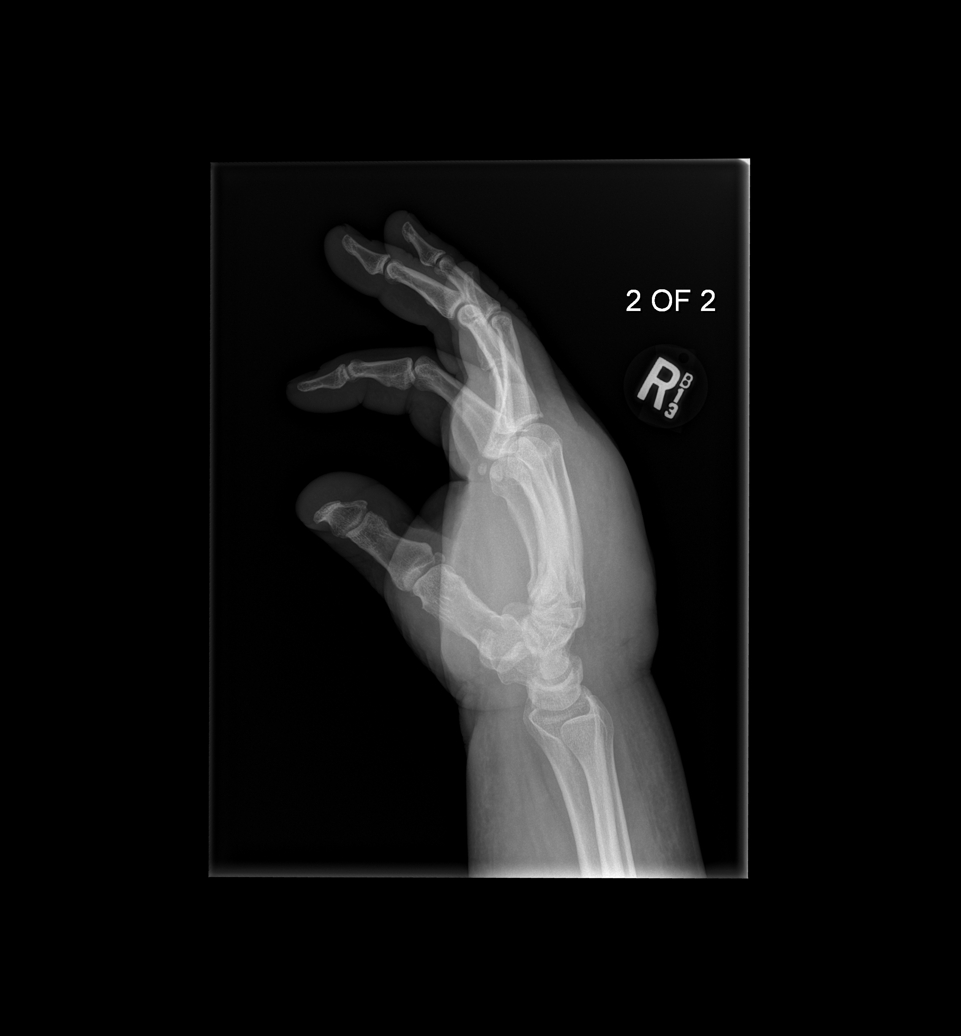

[4 of 4 positions shown; findings below may reference images not displayed]

FINDINGS: There is no evidence of fracture or dislocation. There is no
evidence of arthropathy or other focal bone abnormality. Stable
linear metallic foreign body seen in the soft tissues on the ulnar
side of the carpal bones. Severe dorsal soft tissue swelling is
noted concerning for infection..
IMPRESSION: Severe dorsal soft tissue swelling is noted concerning for
infection. Stable appearance of linear metallic foreign body seen on
the ulnar side of the carpal bones. No fracture or bony abnormality
is noted.

## 2019-12-12 IMAGING — CR DG CHEST 2V
2 series · 2 of 2 positions shown · non-contrast
Comparison: Radiographs May 25, 2017.

CLINICAL DATA: Shortness of breath.

EXAM:
CHEST - 2 VIEW

[w chest pa]
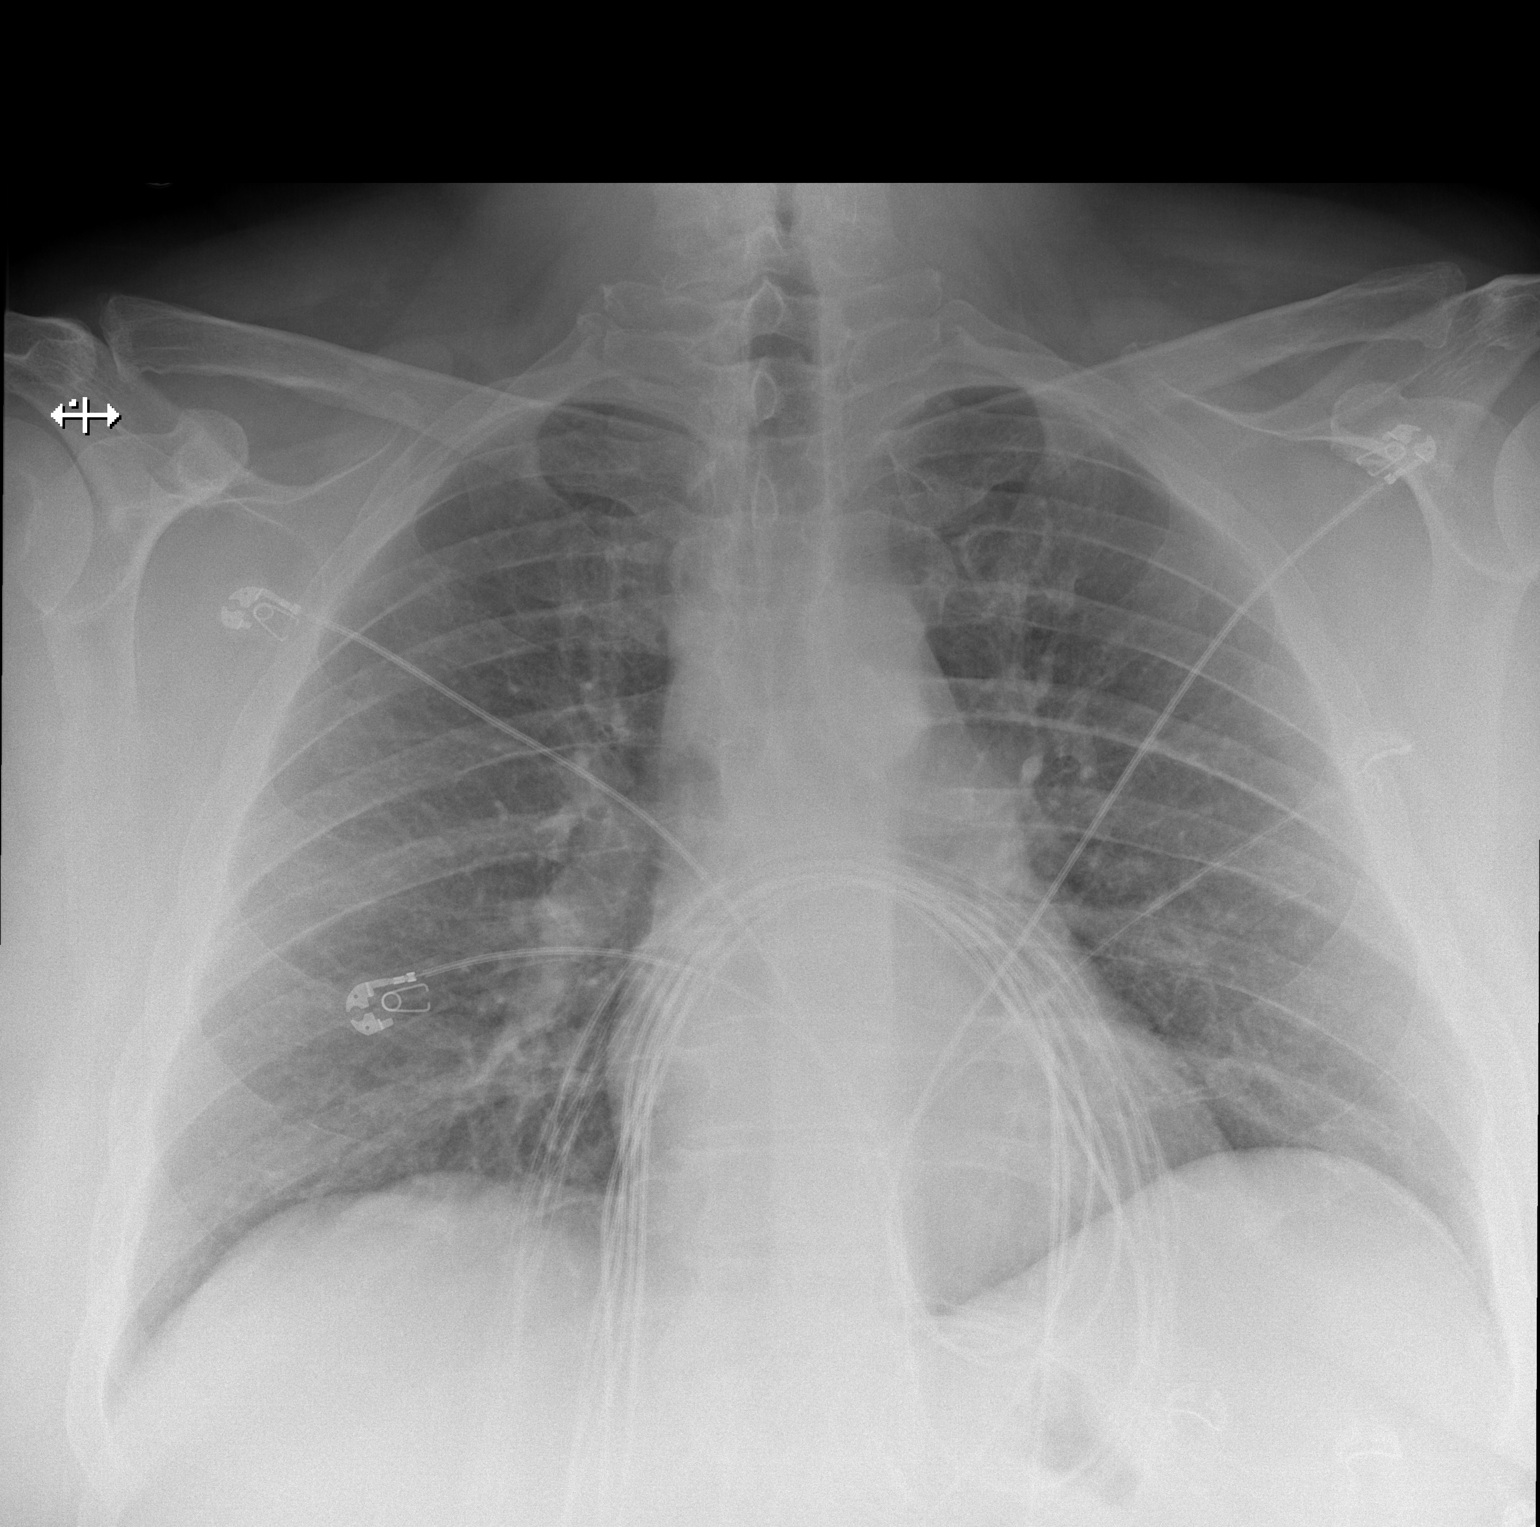

[w chest lat]
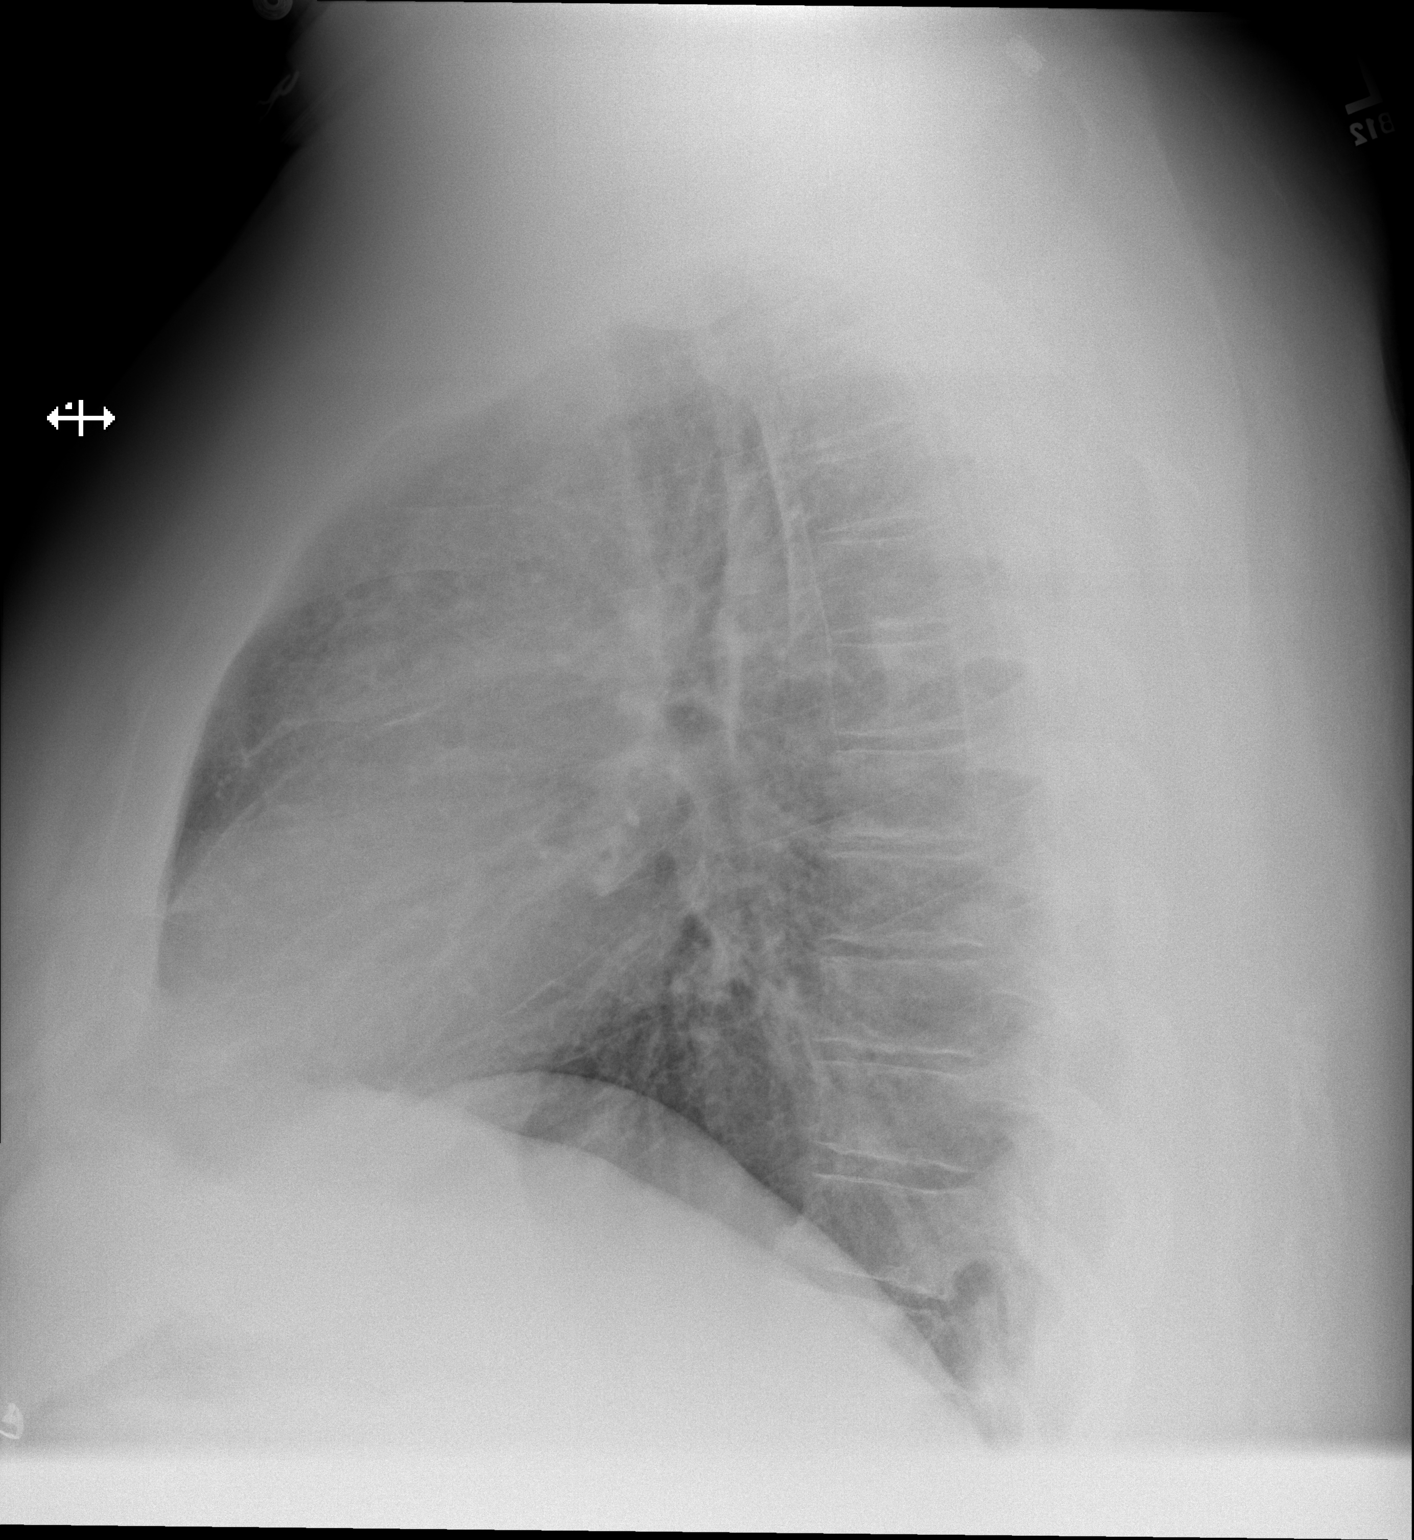

[2 of 2 positions shown; findings below may reference images not displayed]

FINDINGS: The heart size and mediastinal contours are within normal limits.
Both lungs are clear. No pneumothorax or pleural effusion is noted.
The visualized skeletal structures are unremarkable.
IMPRESSION: No active cardiopulmonary disease.

## 2020-06-15 IMAGING — DX PORTABLE CHEST - 1 VIEW
1 series · 1 of 1 positions shown · non-contrast
Comparison: 03/14/2018

CLINICAL DATA: Altered mental status, overdose

EXAM:
PORTABLE CHEST 1 VIEW

[chest]
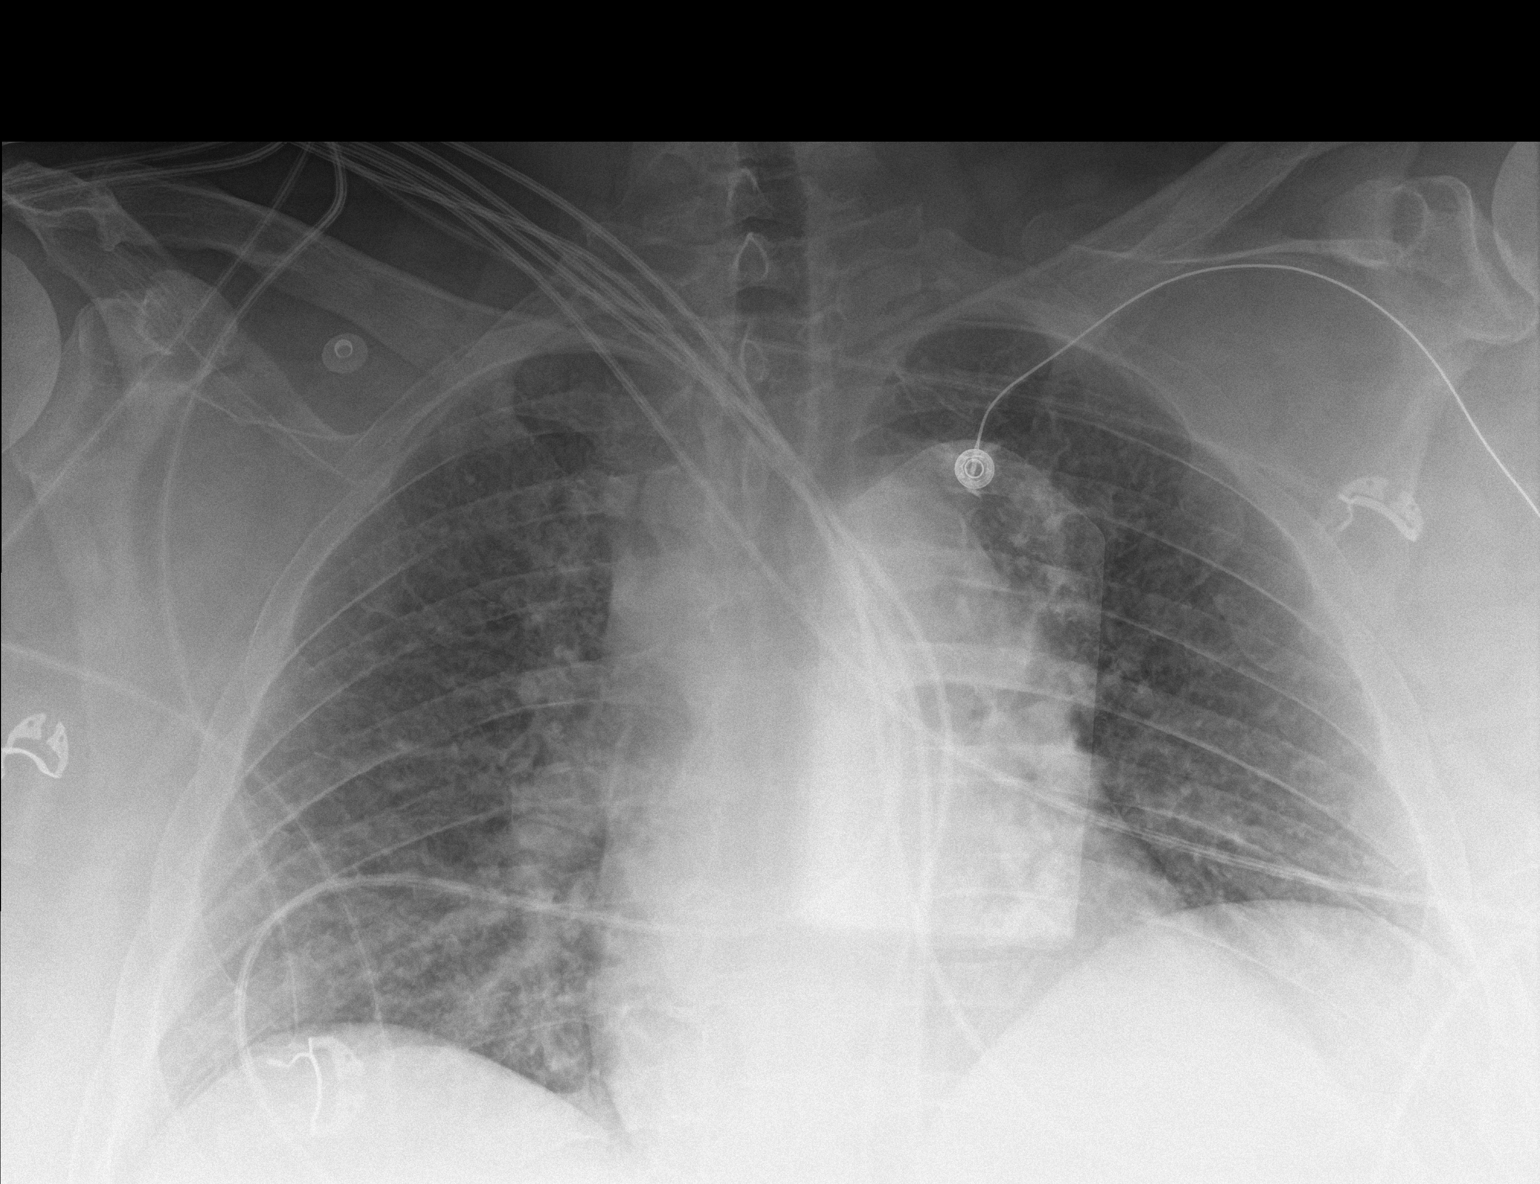

[1 of 1 positions shown; findings below may reference images not displayed]

FINDINGS: Low volume AP portable examination with mild, diffuse interstitial
pulmonary opacity, which may reflect bronchovascular crowding and/or
mild edema. No focal airspace opacity. The heart and mediastinum are
unremarkable.
IMPRESSION: Low volume AP portable examination with mild, diffuse interstitial
pulmonary opacity, which may reflect bronchovascular crowding and/or
mild edema. No focal airspace opacity. The heart and mediastinum are
unremarkable.

## 2020-08-18 IMAGING — DX DG HAND COMPLETE 3+V*L*
3 series · 3 of 3 positions shown · non-contrast
Comparison: 09/05/2013

CLINICAL DATA: Hand swelling

EXAM:
LEFT HAND - COMPLETE 3+ VIEW

[hand pa]
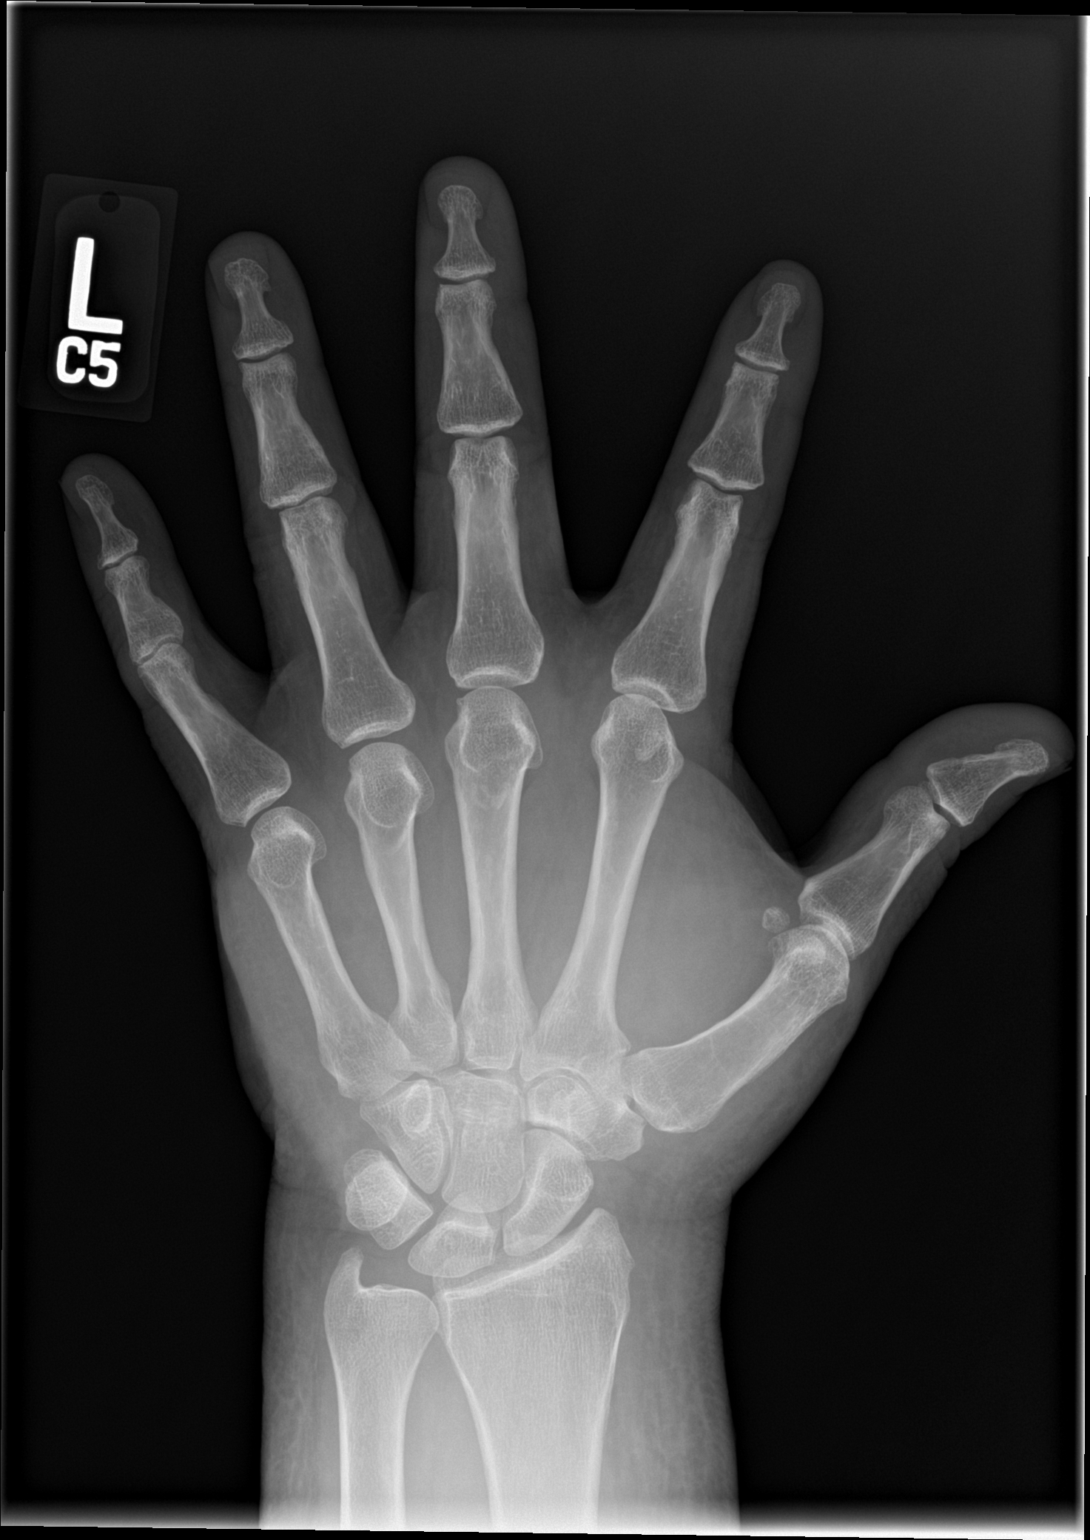

[hand obl]
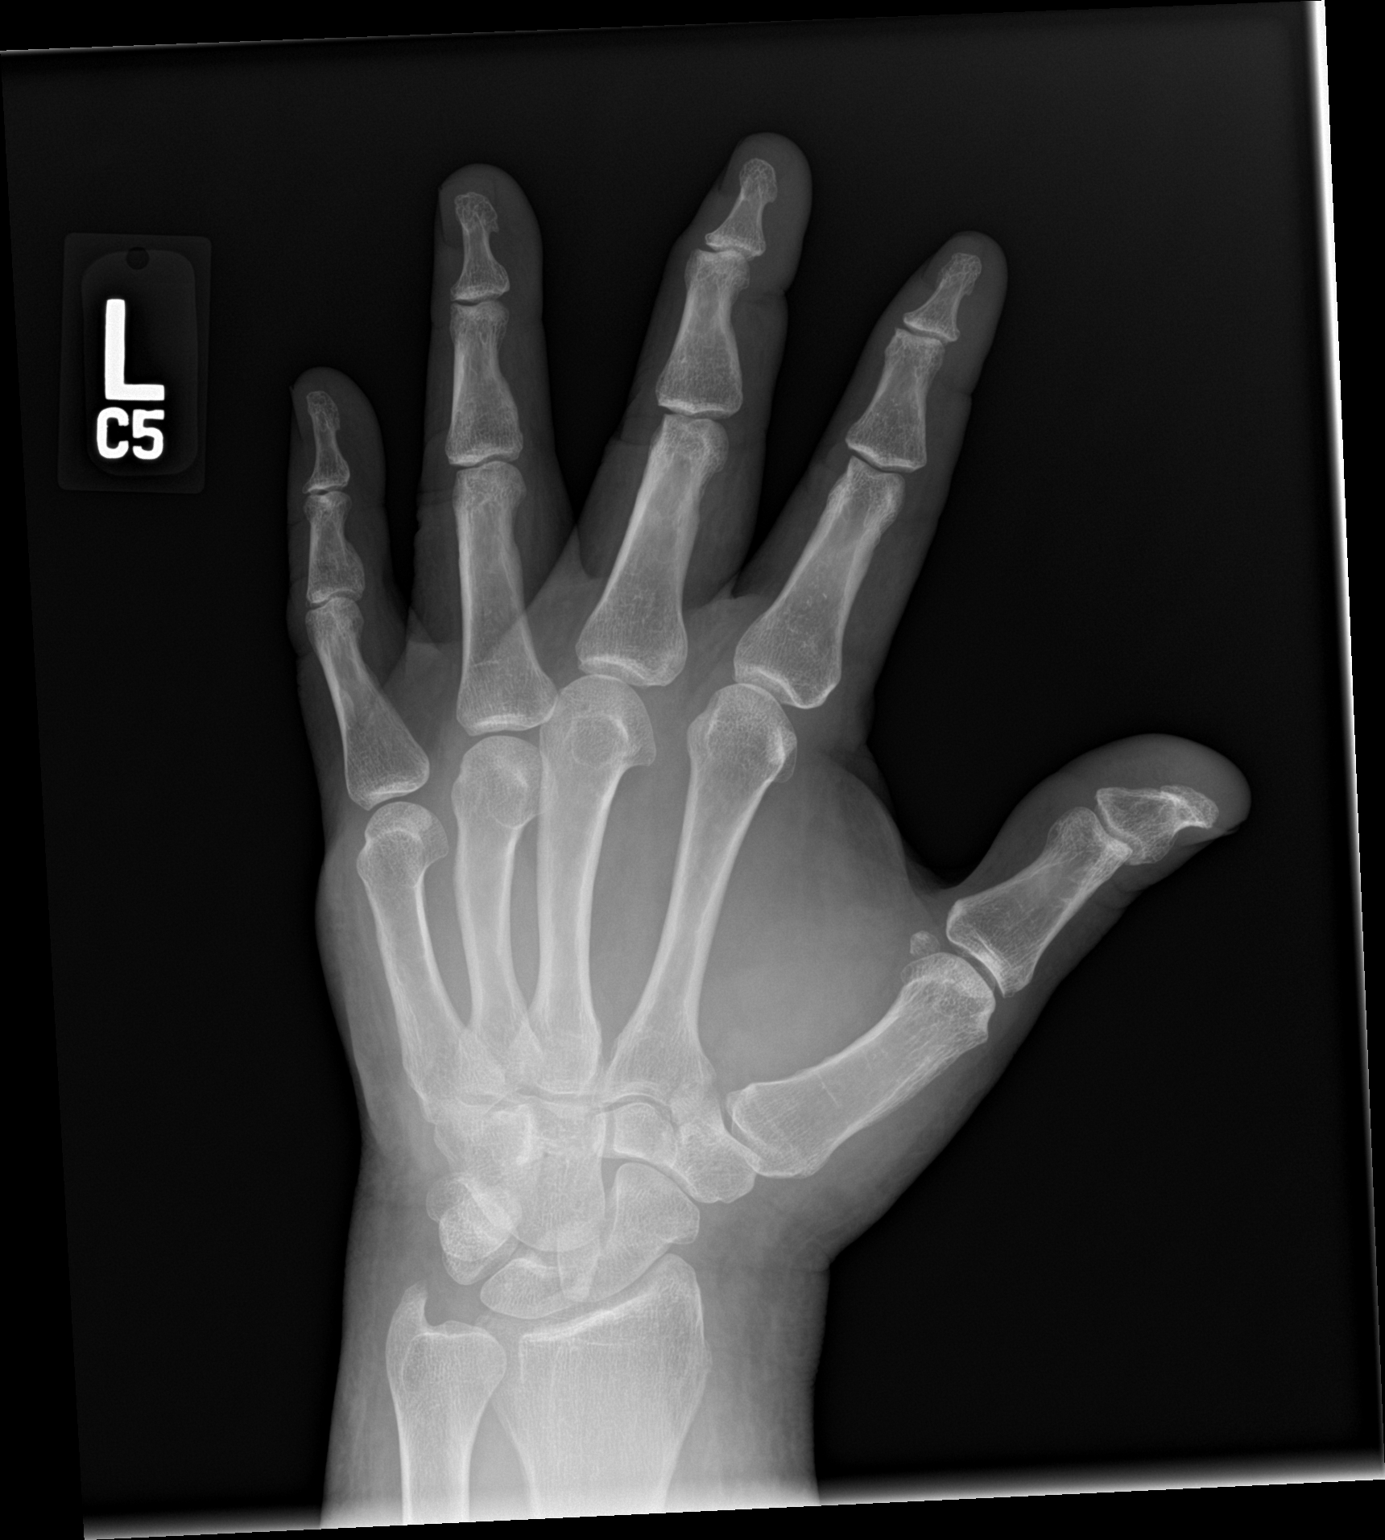

[hand lat]
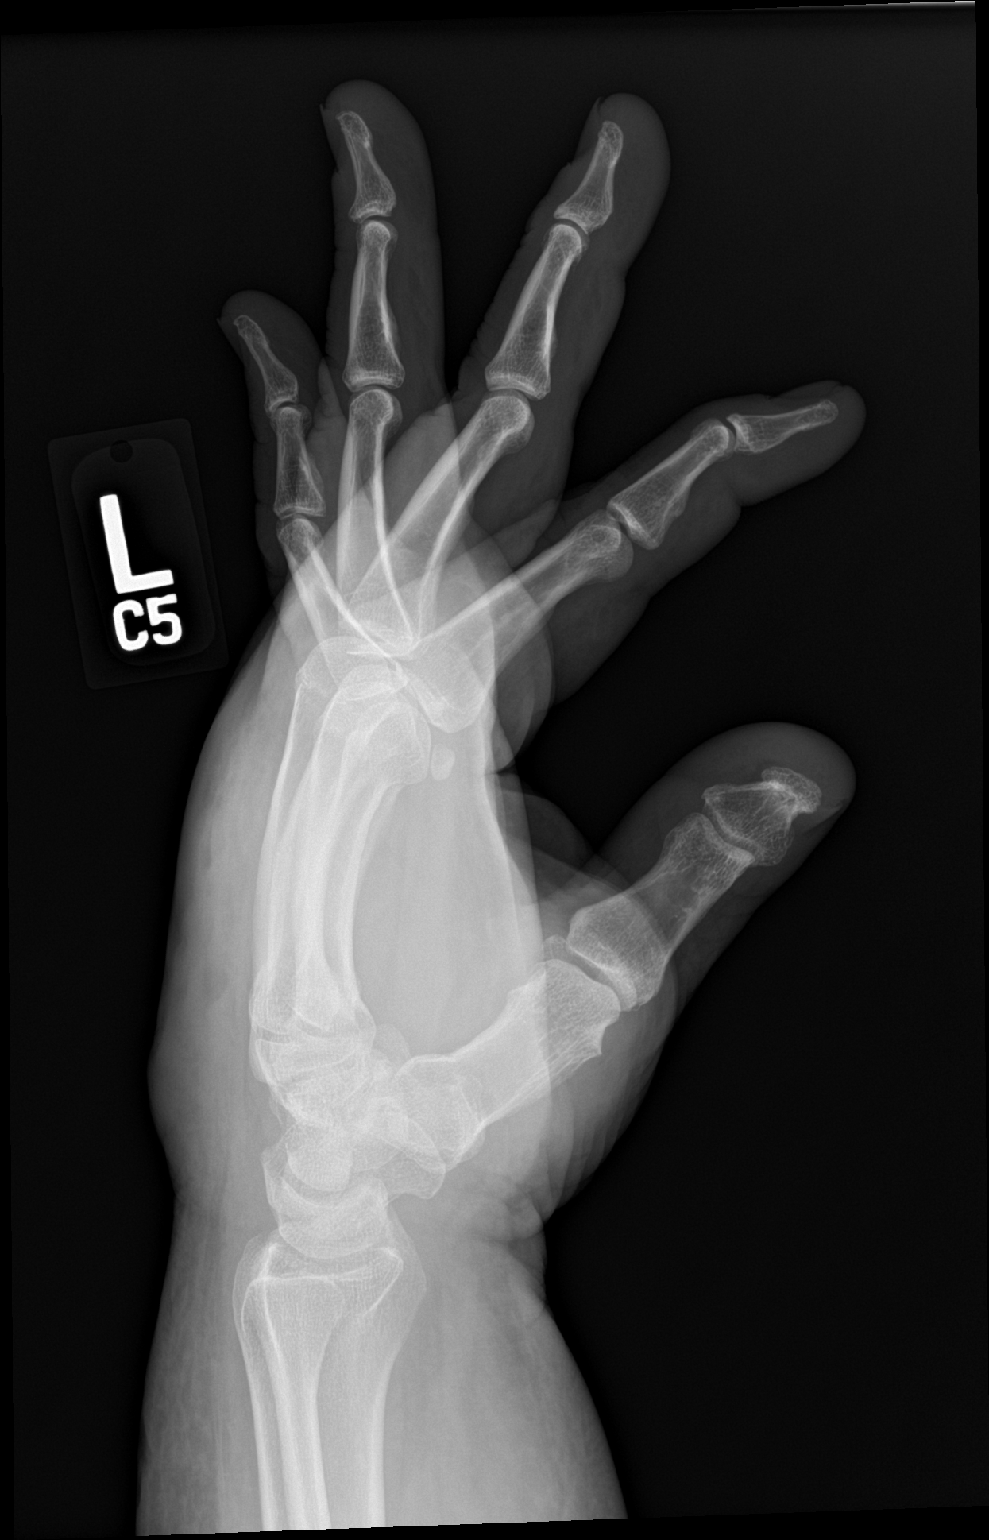

[3 of 3 positions shown; findings below may reference images not displayed]

FINDINGS: There is no evidence of fracture or dislocation. There is no
evidence of arthropathy or other focal bone abnormality.
Moderate-severe soft tissue swelling throughout the hand and
visualized distal forearm, most pronounced over the dorsum of the
hand. No definite soft tissue gas.
IMPRESSION: 1. Moderate to severe soft tissue swelling of the left hand.
2. No acute osseous abnormality.
